# Patient Record
Sex: Female | Born: 1970 | Race: Black or African American | Hispanic: No | Marital: Single
Health system: Southern US, Community
[De-identification: ages and names within clinical notes are randomized; demographics above are authoritative.]

## PROBLEM LIST (undated history)

## (undated) ENCOUNTER — Emergency Department (HOSPITAL_COMMUNITY): Admission: EM | Payer: Medicare HMO

## (undated) DIAGNOSIS — K219 Gastro-esophageal reflux disease without esophagitis: Secondary | ICD-10-CM

## (undated) DIAGNOSIS — F32A Depression, unspecified: Secondary | ICD-10-CM

## (undated) DIAGNOSIS — M549 Dorsalgia, unspecified: Secondary | ICD-10-CM

## (undated) DIAGNOSIS — G629 Polyneuropathy, unspecified: Secondary | ICD-10-CM

## (undated) DIAGNOSIS — G8929 Other chronic pain: Secondary | ICD-10-CM

## (undated) DIAGNOSIS — M1712 Unilateral primary osteoarthritis, left knee: Secondary | ICD-10-CM

## (undated) DIAGNOSIS — I10 Essential (primary) hypertension: Secondary | ICD-10-CM

## (undated) DIAGNOSIS — R0600 Dyspnea, unspecified: Secondary | ICD-10-CM

## (undated) DIAGNOSIS — J449 Chronic obstructive pulmonary disease, unspecified: Secondary | ICD-10-CM

## (undated) DIAGNOSIS — F419 Anxiety disorder, unspecified: Secondary | ICD-10-CM

## (undated) DIAGNOSIS — F329 Major depressive disorder, single episode, unspecified: Secondary | ICD-10-CM

## (undated) DIAGNOSIS — G43909 Migraine, unspecified, not intractable, without status migrainosus: Secondary | ICD-10-CM

## (undated) DIAGNOSIS — M1990 Unspecified osteoarthritis, unspecified site: Secondary | ICD-10-CM

## (undated) DIAGNOSIS — J45909 Unspecified asthma, uncomplicated: Secondary | ICD-10-CM

## (undated) DIAGNOSIS — F319 Bipolar disorder, unspecified: Secondary | ICD-10-CM

## (undated) HISTORY — PX: BACK SURGERY: SHX140

## (undated) HISTORY — PX: TUBAL LIGATION: SHX77

---
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
## 2001-07-30 ENCOUNTER — Emergency Department (HOSPITAL_COMMUNITY): Admission: EM | Admit: 2001-07-30 | Discharge: 2001-07-30 | Payer: Self-pay | Admitting: Emergency Medicine

## 2002-03-28 ENCOUNTER — Encounter: Payer: Self-pay | Admitting: Emergency Medicine

## 2002-03-28 ENCOUNTER — Emergency Department (HOSPITAL_COMMUNITY): Admission: EM | Admit: 2002-03-28 | Discharge: 2002-03-28 | Payer: Self-pay | Admitting: Emergency Medicine

## 2002-11-08 ENCOUNTER — Encounter: Payer: Self-pay | Admitting: Emergency Medicine

## 2002-11-08 ENCOUNTER — Emergency Department (HOSPITAL_COMMUNITY): Admission: EM | Admit: 2002-11-08 | Discharge: 2002-11-08 | Payer: Self-pay | Admitting: Emergency Medicine

## 2003-02-01 ENCOUNTER — Emergency Department (HOSPITAL_COMMUNITY): Admission: EM | Admit: 2003-02-01 | Discharge: 2003-02-01 | Payer: Self-pay | Admitting: Emergency Medicine

## 2003-03-02 ENCOUNTER — Emergency Department (HOSPITAL_COMMUNITY): Admission: EM | Admit: 2003-03-02 | Discharge: 2003-03-02 | Payer: Self-pay | Admitting: Emergency Medicine

## 2003-05-18 ENCOUNTER — Emergency Department (HOSPITAL_COMMUNITY): Admission: EM | Admit: 2003-05-18 | Discharge: 2003-05-18 | Payer: Self-pay | Admitting: Emergency Medicine

## 2003-08-06 ENCOUNTER — Emergency Department (HOSPITAL_COMMUNITY): Admission: EM | Admit: 2003-08-06 | Discharge: 2003-08-06 | Payer: Self-pay | Admitting: Emergency Medicine

## 2003-11-09 ENCOUNTER — Emergency Department (HOSPITAL_COMMUNITY): Admission: EM | Admit: 2003-11-09 | Discharge: 2003-11-10 | Payer: Self-pay | Admitting: *Deleted

## 2004-01-04 ENCOUNTER — Emergency Department (HOSPITAL_COMMUNITY): Admission: EM | Admit: 2004-01-04 | Discharge: 2004-01-04 | Payer: Self-pay | Admitting: Emergency Medicine

## 2004-05-03 ENCOUNTER — Emergency Department (HOSPITAL_COMMUNITY): Admission: EM | Admit: 2004-05-03 | Discharge: 2004-05-03 | Payer: Self-pay | Admitting: Emergency Medicine

## 2006-03-05 ENCOUNTER — Emergency Department (HOSPITAL_COMMUNITY): Admission: EM | Admit: 2006-03-05 | Discharge: 2006-03-05 | Payer: Self-pay | Admitting: Emergency Medicine

## 2007-01-14 ENCOUNTER — Emergency Department (HOSPITAL_COMMUNITY): Admission: EM | Admit: 2007-01-14 | Discharge: 2007-01-14 | Payer: Self-pay | Admitting: Emergency Medicine

## 2007-04-13 ENCOUNTER — Emergency Department (HOSPITAL_COMMUNITY): Admission: EM | Admit: 2007-04-13 | Discharge: 2007-04-13 | Payer: Self-pay | Admitting: Emergency Medicine

## 2008-11-30 ENCOUNTER — Emergency Department (HOSPITAL_COMMUNITY): Admission: EM | Admit: 2008-11-30 | Discharge: 2008-11-30 | Payer: Self-pay | Admitting: Emergency Medicine

## 2010-05-31 ENCOUNTER — Emergency Department (HOSPITAL_COMMUNITY)
Admission: EM | Admit: 2010-05-31 | Discharge: 2010-05-31 | Disposition: A | Discharge status: Home / Self Care | Payer: Self-pay | Attending: Emergency Medicine | Admitting: Emergency Medicine

## 2010-05-31 ENCOUNTER — Emergency Department (HOSPITAL_COMMUNITY): Payer: Self-pay

## 2010-05-31 DIAGNOSIS — S91309A Unspecified open wound, unspecified foot, initial encounter: Secondary | ICD-10-CM | POA: Insufficient documentation

## 2010-05-31 DIAGNOSIS — Y92009 Unspecified place in unspecified non-institutional (private) residence as the place of occurrence of the external cause: Secondary | ICD-10-CM | POA: Insufficient documentation

## 2010-05-31 DIAGNOSIS — W268XXA Contact with other sharp object(s), not elsewhere classified, initial encounter: Secondary | ICD-10-CM | POA: Insufficient documentation

## 2010-06-01 ENCOUNTER — Emergency Department (HOSPITAL_COMMUNITY)
Admission: EM | Admit: 2010-06-01 | Discharge: 2010-06-01 | Disposition: A | Discharge status: Home / Self Care | Payer: Self-pay | Attending: Emergency Medicine | Admitting: Emergency Medicine

## 2010-06-01 DIAGNOSIS — W268XXA Contact with other sharp object(s), not elsewhere classified, initial encounter: Secondary | ICD-10-CM | POA: Insufficient documentation

## 2010-06-01 DIAGNOSIS — S91309A Unspecified open wound, unspecified foot, initial encounter: Secondary | ICD-10-CM | POA: Insufficient documentation

## 2010-06-01 DIAGNOSIS — Y92009 Unspecified place in unspecified non-institutional (private) residence as the place of occurrence of the external cause: Secondary | ICD-10-CM | POA: Insufficient documentation

## 2010-06-01 DIAGNOSIS — L03119 Cellulitis of unspecified part of limb: Secondary | ICD-10-CM | POA: Insufficient documentation

## 2010-06-01 DIAGNOSIS — L02619 Cutaneous abscess of unspecified foot: Secondary | ICD-10-CM | POA: Insufficient documentation

## 2010-06-01 LAB — DIFFERENTIAL
Basophils Absolute: 0 K/uL (ref 0.0–0.1)
Basophils Relative: 0 % (ref 0–1)
Eosinophils Absolute: 0.1 K/uL (ref 0.0–0.7)
Eosinophils Relative: 2 % (ref 0–5)
Lymphocytes Relative: 32 % (ref 12–46)
Lymphs Abs: 2 K/uL (ref 0.7–4.0)
Monocytes Absolute: 0.4 K/uL (ref 0.1–1.0)
Monocytes Relative: 6 % (ref 3–12)
Neutro Abs: 3.8 K/uL (ref 1.7–7.7)
Neutrophils Relative %: 61 % (ref 43–77)

## 2010-06-01 LAB — COMPREHENSIVE METABOLIC PANEL WITH GFR
ALT: 18 U/L (ref 0–35)
AST: 20 U/L (ref 0–37)
Albumin: 3.4 g/dL — ABNORMAL LOW (ref 3.5–5.2)
Alkaline Phosphatase: 49 U/L (ref 39–117)
BUN: 16 mg/dL (ref 6–23)
CO2: 24 meq/L (ref 19–32)
Calcium: 9.1 mg/dL (ref 8.4–10.5)
Chloride: 108 meq/L (ref 96–112)
Creatinine, Ser: 1.18 mg/dL (ref 0.4–1.2)
GFR calc non Af Amer: 50 mL/min — ABNORMAL LOW
Glucose, Bld: 90 mg/dL (ref 70–99)
Potassium: 4.1 meq/L (ref 3.5–5.1)
Sodium: 139 meq/L (ref 135–145)
Total Bilirubin: 0.3 mg/dL (ref 0.3–1.2)
Total Protein: 6.8 g/dL (ref 6.0–8.3)

## 2010-06-01 LAB — CBC
MCV: 94.2 fL (ref 78.0–100.0)
Platelets: 377 10*3/uL (ref 150–400)
RBC: 3.81 MIL/uL — ABNORMAL LOW (ref 3.87–5.11)
RDW: 13.3 % (ref 11.5–15.5)
WBC: 6.2 10*3/uL (ref 4.0–10.5)

## 2010-06-03 ENCOUNTER — Emergency Department (HOSPITAL_COMMUNITY)
Admission: EM | Admit: 2010-06-03 | Discharge: 2010-06-03 | Disposition: A | Discharge status: Home / Self Care | Payer: Self-pay | Attending: Emergency Medicine | Admitting: Emergency Medicine

## 2010-06-03 DIAGNOSIS — IMO0002 Reserved for concepts with insufficient information to code with codable children: Secondary | ICD-10-CM | POA: Insufficient documentation

## 2010-06-03 DIAGNOSIS — W268XXA Contact with other sharp object(s), not elsewhere classified, initial encounter: Secondary | ICD-10-CM | POA: Insufficient documentation

## 2010-06-03 DIAGNOSIS — M79609 Pain in unspecified limb: Secondary | ICD-10-CM | POA: Insufficient documentation

## 2010-07-21 ENCOUNTER — Emergency Department (HOSPITAL_COMMUNITY)
Admission: EM | Admit: 2010-07-21 | Discharge: 2010-07-21 | Disposition: A | Discharge status: Home / Self Care | Payer: Self-pay | Attending: Emergency Medicine | Admitting: Emergency Medicine

## 2010-07-21 ENCOUNTER — Emergency Department (HOSPITAL_COMMUNITY): Payer: Self-pay

## 2010-07-21 DIAGNOSIS — M545 Low back pain, unspecified: Secondary | ICD-10-CM | POA: Insufficient documentation

## 2010-07-21 DIAGNOSIS — M47817 Spondylosis without myelopathy or radiculopathy, lumbosacral region: Secondary | ICD-10-CM | POA: Insufficient documentation

## 2010-07-21 DIAGNOSIS — G8929 Other chronic pain: Secondary | ICD-10-CM | POA: Insufficient documentation

## 2010-07-21 DIAGNOSIS — Z9851 Tubal ligation status: Secondary | ICD-10-CM | POA: Insufficient documentation

## 2010-07-23 LAB — RAPID STREP SCREEN (MED CTR MEBANE ONLY): Streptococcus, Group A Screen (Direct): NEGATIVE

## 2010-07-23 LAB — URINALYSIS, ROUTINE W REFLEX MICROSCOPIC
Bilirubin Urine: NEGATIVE
Glucose, UA: NEGATIVE mg/dL
Hgb urine dipstick: NEGATIVE
Specific Gravity, Urine: 1.025 (ref 1.005–1.030)

## 2010-11-04 ENCOUNTER — Emergency Department (HOSPITAL_COMMUNITY)
Admission: EM | Admit: 2010-11-04 | Discharge: 2010-11-04 | Disposition: A | Discharge status: Home / Self Care | Payer: Medicaid Other | Attending: Emergency Medicine | Admitting: Emergency Medicine

## 2010-11-04 DIAGNOSIS — Z760 Encounter for issue of repeat prescription: Secondary | ICD-10-CM | POA: Insufficient documentation

## 2010-11-04 DIAGNOSIS — I10 Essential (primary) hypertension: Secondary | ICD-10-CM | POA: Insufficient documentation

## 2010-11-04 DIAGNOSIS — S339XXA Sprain of unspecified parts of lumbar spine and pelvis, initial encounter: Secondary | ICD-10-CM | POA: Insufficient documentation

## 2010-11-04 DIAGNOSIS — S39012A Strain of muscle, fascia and tendon of lower back, initial encounter: Secondary | ICD-10-CM

## 2010-11-04 HISTORY — DX: Essential (primary) hypertension: I10

## 2010-11-04 MED ORDER — HYDROCODONE-ACETAMINOPHEN 5-325 MG PO TABS
1.0000 | ORAL_TABLET | Freq: Once | ORAL | Status: AC
  Administered 2010-11-04: 1 via ORAL
  Filled 2010-11-04: qty 1

## 2010-11-04 MED ORDER — HYDROCHLOROTHIAZIDE 25 MG PO TABS: 25.0000 mg | ORAL_TABLET | Freq: Every day | ORAL | Status: DC

## 2010-11-04 MED ORDER — HYDROCODONE-ACETAMINOPHEN 5-325 MG PO TABS: 1.0000 | ORAL_TABLET | ORAL | Status: AC | PRN
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
## 2010-12-07 NOTE — ED Provider Notes (Signed)
Medical screening examination/treatment/procedure(s) were performed by non-physician practitioner and as supervising physician I was immediately available for consultation/collaboration.   Benny Lennert, MD 12/07/10 [phone]

## 2011-11-20 ENCOUNTER — Emergency Department (HOSPITAL_COMMUNITY)
Admission: EM | Admit: 2011-11-20 | Discharge: 2011-11-20 | Disposition: A | Discharge status: Home / Self Care | Payer: Medicaid Other | Attending: Emergency Medicine | Admitting: Emergency Medicine

## 2011-11-20 ENCOUNTER — Emergency Department (HOSPITAL_COMMUNITY): Payer: Medicaid Other

## 2011-11-20 ENCOUNTER — Encounter (HOSPITAL_COMMUNITY): Payer: Self-pay | Admitting: *Deleted

## 2011-11-20 DIAGNOSIS — I10 Essential (primary) hypertension: Secondary | ICD-10-CM | POA: Insufficient documentation

## 2011-11-20 DIAGNOSIS — W1789XA Other fall from one level to another, initial encounter: Secondary | ICD-10-CM | POA: Insufficient documentation

## 2011-11-20 DIAGNOSIS — R109 Unspecified abdominal pain: Secondary | ICD-10-CM | POA: Insufficient documentation

## 2011-11-20 DIAGNOSIS — S63509A Unspecified sprain of unspecified wrist, initial encounter: Secondary | ICD-10-CM | POA: Insufficient documentation

## 2011-11-20 DIAGNOSIS — Y92009 Unspecified place in unspecified non-institutional (private) residence as the place of occurrence of the external cause: Secondary | ICD-10-CM | POA: Insufficient documentation

## 2011-11-20 DIAGNOSIS — W19XXXA Unspecified fall, initial encounter: Secondary | ICD-10-CM

## 2011-11-20 DIAGNOSIS — F1010 Alcohol abuse, uncomplicated: Secondary | ICD-10-CM | POA: Insufficient documentation

## 2011-11-20 DIAGNOSIS — M25559 Pain in unspecified hip: Secondary | ICD-10-CM | POA: Insufficient documentation

## 2011-11-20 MED ORDER — TRAMADOL HCL 50 MG PO TABS
50.0000 mg | ORAL_TABLET | Freq: Once | ORAL | Status: AC
  Administered 2011-11-20: 50 mg via ORAL
  Filled 2011-11-20: qty 1

## 2011-11-20 MED ORDER — TRAMADOL HCL 50 MG PO TABS: 50.0000 mg | ORAL_TABLET | Freq: Four times a day (QID) | ORAL | Status: AC | PRN
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
## 2011-11-20 NOTE — ED Notes (Signed)
Patient transported to CT 
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
## 2013-05-31 ENCOUNTER — Emergency Department (HOSPITAL_COMMUNITY)
Admission: EM | Admit: 2013-05-31 | Discharge: 2013-05-31 | Disposition: A | Discharge status: Home / Self Care | Payer: Medicaid Other | Attending: Emergency Medicine | Admitting: Emergency Medicine

## 2013-05-31 ENCOUNTER — Emergency Department (HOSPITAL_COMMUNITY): Payer: Medicaid Other

## 2013-05-31 ENCOUNTER — Encounter (HOSPITAL_COMMUNITY): Payer: Self-pay | Admitting: Emergency Medicine

## 2013-05-31 DIAGNOSIS — Z79899 Other long term (current) drug therapy: Secondary | ICD-10-CM | POA: Insufficient documentation

## 2013-05-31 DIAGNOSIS — I10 Essential (primary) hypertension: Secondary | ICD-10-CM | POA: Insufficient documentation

## 2013-05-31 DIAGNOSIS — J111 Influenza due to unidentified influenza virus with other respiratory manifestations: Secondary | ICD-10-CM | POA: Insufficient documentation

## 2013-05-31 LAB — URINALYSIS, ROUTINE W REFLEX MICROSCOPIC
BILIRUBIN URINE: NEGATIVE
Glucose, UA: NEGATIVE mg/dL
HGB URINE DIPSTICK: NEGATIVE
Ketones, ur: NEGATIVE mg/dL
Leukocytes, UA: NEGATIVE
NITRITE: NEGATIVE
PROTEIN: 30 mg/dL — AB
SPECIFIC GRAVITY, URINE: 1.025 (ref 1.005–1.030)
UROBILINOGEN UA: 2 mg/dL — AB (ref 0.0–1.0)
pH: 6 (ref 5.0–8.0)

## 2013-05-31 LAB — CBC WITH DIFFERENTIAL/PLATELET
BASOS PCT: 0 % (ref 0–1)
Basophils Absolute: 0 10*3/uL (ref 0.0–0.1)
EOS ABS: 0 10*3/uL (ref 0.0–0.7)
EOS PCT: 0 % (ref 0–5)
HCT: 39.9 % (ref 36.0–46.0)
Hemoglobin: 13.9 g/dL (ref 12.0–15.0)
Lymphocytes Relative: 17 % (ref 12–46)
Lymphs Abs: 1.3 10*3/uL (ref 0.7–4.0)
MCH: 32.4 pg (ref 26.0–34.0)
MCHC: 34.8 g/dL (ref 30.0–36.0)
MCV: 93 fL (ref 78.0–100.0)
Monocytes Absolute: 0.7 10*3/uL (ref 0.1–1.0)
Monocytes Relative: 9 % (ref 3–12)
NEUTROS PCT: 74 % (ref 43–77)
Neutro Abs: 5.9 10*3/uL (ref 1.7–7.7)
PLATELETS: 355 10*3/uL (ref 150–400)
RBC: 4.29 MIL/uL (ref 3.87–5.11)
RDW: 12.8 % (ref 11.5–15.5)
WBC: 8 10*3/uL (ref 4.0–10.5)

## 2013-05-31 LAB — BASIC METABOLIC PANEL
BUN: 10 mg/dL (ref 6–23)
CALCIUM: 8.9 mg/dL (ref 8.4–10.5)
CO2: 23 mEq/L (ref 19–32)
Chloride: 98 mEq/L (ref 96–112)
Creatinine, Ser: 0.98 mg/dL (ref 0.50–1.10)
GFR, EST AFRICAN AMERICAN: 80 mL/min — AB (ref >90)
GFR, EST NON AFRICAN AMERICAN: 69 mL/min — AB (ref >90)
Glucose, Bld: 100 mg/dL — ABNORMAL HIGH (ref 70–99)
POTASSIUM: 4.1 meq/L (ref 3.7–5.3)
SODIUM: 134 meq/L — AB (ref 137–147)

## 2013-05-31 LAB — URINE MICROSCOPIC-ADD ON

## 2013-05-31 MED ORDER — KETOROLAC TROMETHAMINE 30 MG/ML IJ SOLN
30.0000 mg | Freq: Once | INTRAMUSCULAR | Status: AC
  Administered 2013-05-31: 30 mg via INTRAVENOUS
  Filled 2013-05-31: qty 1

## 2013-05-31 MED ORDER — TRAMADOL HCL 50 MG PO TABS: 50.0000 mg | ORAL_TABLET | Freq: Four times a day (QID) | ORAL | Status: DC | PRN

## 2013-05-31 MED ORDER — SODIUM CHLORIDE 0.9 % IV SOLN
1000.0000 mL | INTRAVENOUS | Status: DC
  Administered 2013-05-31: 1000 mL via INTRAVENOUS

## 2013-05-31 MED ORDER — SODIUM CHLORIDE 0.9 % IV SOLN
1000.0000 mL | Freq: Once | INTRAVENOUS | Status: AC
  Administered 2013-05-31: 1000 mL via INTRAVENOUS

## 2013-05-31 NOTE — ED Notes (Signed)
Pt states that she does feel better at discharge, is no longer dizzy with standing,

## 2013-05-31 NOTE — Discharge Instructions (Signed)
Influenza, Adult Influenza ("the flu") is a viral infection of the respiratory tract. It occurs more often in winter months because people spend more time in close contact with one another. Influenza can make you feel very sick. Influenza easily spreads from person to person (contagious). CAUSES  Influenza is caused by a virus that infects the respiratory tract. You can catch the virus by breathing in droplets from an infected person's cough or sneeze. You can also catch the virus by touching something that was recently contaminated with the virus and then touching your mouth, nose, or eyes. SYMPTOMS  Symptoms typically last 4 to 10 days and may include:  Fever.  Chills.  Headache, body aches, and muscle aches.  Sore throat.  Chest discomfort and cough.  Poor appetite.  Weakness or feeling tired.  Dizziness.  Nausea or vomiting. DIAGNOSIS  Diagnosis of influenza is often made based on your history and a physical exam. A nose or throat swab test can be done to confirm the diagnosis. RISKS AND COMPLICATIONS You may be at risk for a more severe case of influenza if you smoke cigarettes, have diabetes, have chronic heart disease (such as heart failure) or lung disease (such as asthma), or if you have a weakened immune system. Elderly people and pregnant women are also at risk for more serious infections. The most common complication of influenza is a lung infection (pneumonia). Sometimes, this complication can require emergency medical care and may be life-threatening. PREVENTION  An annual influenza vaccination (flu shot) is the best way to avoid getting influenza. An annual flu shot is now routinely recommended for all adults in the [place] TREATMENT  In mild cases, influenza goes away on its own. Treatment is directed at relieving symptoms. For more severe cases, your caregiver may prescribe antiviral medicines to shorten the sickness. Antibiotic medicines are not effective, because the  infection is caused by a virus, not by bacteria. HOME CARE INSTRUCTIONS  Only take over-the-counter or prescription medicines for pain, discomfort, or fever as directed by your caregiver.  Use a cool mist humidifier to make breathing easier.  Get plenty of rest until your temperature returns to normal. This usually takes 3 to 4 days.  Drink enough fluids to keep your urine clear or pale yellow.  Cover your mouth and nose when coughing or sneezing, and wash your hands well to avoid spreading the virus.  Stay home from work or school until your fever has been gone for at least 1 full day. SEEK MEDICAL CARE IF:   You have chest pain or a deep cough that worsens or produces more mucus.  You have nausea, vomiting, or diarrhea. SEEK IMMEDIATE MEDICAL CARE IF:   You have difficulty breathing, shortness of breath, or your skin or nails turn bluish.  You have severe neck pain or stiffness.  You have a severe headache, facial pain, or earache.  You have a worsening or recurring fever.  You have nausea or vomiting that cannot be controlled. MAKE SURE YOU:  Understand these instructions.  Will watch your condition.  Will get help right away if you are not doing well or get worse. Document Released: 03/31/2000 Document Revised: 10/03/2011 Document Reviewed: 07/03/2011 [facility] Patient Information 2014 [place], [place].  Tramadol tablets What is this medicine? TRAMADOL (TRA ma dole) is a pain reliever. It is used to treat moderate to severe pain in adults. This medicine may be used for other purposes; ask your health care provider or pharmacist if you have questions.  COMMON BRAND NAME(S): Ultram What should I tell my health care provider before I take this medicine? They need to know if you have any of these conditions: -brain tumor -depression -drug abuse or addiction -head injury -if you frequently drink alcohol containing drinks -kidney disease or trouble passing urine -liver  disease -lung disease, asthma, or breathing problems -seizures or epilepsy -suicidal thoughts, plans, or attempt; a previous suicide attempt by you or a family member -an unusual or allergic reaction to tramadol, codeine, other medicines, foods, dyes, or preservatives -pregnant or trying to get pregnant -breast-feeding How should I use this medicine? Take this medicine by mouth with a full glass of water. Follow the directions on the prescription label. If the medicine upsets your stomach, take it with food or milk. Do not take more medicine than you are told to take. Talk to your pediatrician regarding the use of this medicine in children. Special care may be needed. Overdosage: If you think you have taken too much of this medicine contact a poison control center or emergency room at once. NOTE: This medicine is only for you. Do not share this medicine with others. What if I miss a dose? If you miss a dose, take it as soon as you can. If it is almost time for your next dose, take only that dose. Do not take double or extra doses. What may interact with this medicine? Do not take this medicine with any of the following medications: -MAOIs like Carbex, Eldepryl, Marplan, Nardil, and Parnate This medicine may also interact with the following medications: -alcohol or medicines that contain alcohol -antihistamines -benzodiazepines -bupropion -carbamazepine or oxcarbazepine -clozapine -cyclobenzaprine -digoxin -furazolidone -linezolid -medicines for depression, anxiety, or psychotic disturbances -medicines for migraine headache like almotriptan, eletriptan, frovatriptan, naratriptan, rizatriptan, sumatriptan, zolmitriptan -medicines for pain like pentazocine, buprenorphine, butorphanol, meperidine, nalbuphine, and propoxyphene -medicines for sleep -muscle relaxants -naltrexone -phenobarbital -phenothiazines like perphenazine, thioridazine, chlorpromazine, mesoridazine, fluphenazine,  prochlorperazine, promazine, and trifluoperazine -procarbazine -warfarin This list may not describe all possible interactions. Give your health care provider a list of all the medicines, herbs, non-prescription drugs, or dietary supplements you use. Also tell them if you smoke, drink alcohol, or use illegal drugs. Some items may interact with your medicine. What should I watch for while using this medicine? Tell your doctor or health care professional if your pain does not go away, if it gets worse, or if you have new or a different type of pain. You may develop tolerance to the medicine. Tolerance means that you will need a higher dose of the medicine for pain relief. Tolerance is normal and is expected if you take this medicine for a long time. Do not suddenly stop taking your medicine because you may develop a severe reaction. Your body becomes used to the medicine. This does NOT mean you are addicted. Addiction is a behavior related to getting and using a drug for a non-medical reason. If you have pain, you have a medical reason to take pain medicine. Your doctor will tell you how much medicine to take. If your doctor wants you to stop the medicine, the dose will be slowly lowered over time to avoid any side effects. You may get drowsy or dizzy. Do not drive, use machinery, or do anything that needs mental alertness until you know how this medicine affects you. Do not stand or sit up quickly, especially if you are an older patient. This reduces the risk of dizzy or fainting spells. Alcohol can increase or  decrease the effects of this medicine. Avoid alcoholic drinks. You may have constipation. Try to have a bowel movement at least every 2 to 3 days. If you do not have a bowel movement for 3 days, call your doctor or health care professional. Your mouth may get dry. Chewing sugarless gum or sucking hard candy, and drinking plenty of water may help. Contact your doctor if the problem does not go away or  is severe. What side effects may I notice from receiving this medicine? Side effects that you should report to your doctor or health care professional as soon as possible: -allergic reactions like skin rash, itching or hives, swelling of the face, lips, or tongue -breathing difficulties, wheezing -confusion -itching -light headedness or fainting spells -redness, blistering, peeling or loosening of the skin, including inside the mouth -seizures Side effects that usually do not require medical attention (report to your doctor or health care professional if they continue or are bothersome): -constipation -dizziness -drowsiness -headache -nausea, vomiting This list may not describe all possible side effects. Call your doctor for medical advice about side effects. You may report side effects to FDA at 1-800-FDA-1088. Where should I keep my medicine? Keep out of the reach of children. Store at room temperature between 15 and 30 degrees C (59 and 86 degrees F). Keep container tightly closed. Throw away any unused medicine after the expiration date. NOTE: This sheet is a summary. It may not cover all possible information. If you have questions about this medicine, talk to your doctor, pharmacist, or health care provider.  2014, [employer]. (2009-12-15 11:55:44)

## 2013-05-31 NOTE — ED Notes (Signed)
Pt states that she is not feeling any better, continues to complain of pain in chest and legs, comfort measure provided, update given

## 2013-05-31 NOTE — ED Provider Notes (Signed)
CSN: [id number]     Arrival date & time 05/31/13  0531 History   First MD Initiated Contact with Patient 05/31/13 0546     Chief Complaint  Patient presents with  . Generalized Body Aches  . Cough  . Influenza     (Consider location/radiation/quality/duration/timing/severity/associated sxs/prior Treatment) Patient is a 43 y.o. female presenting with cough and flu symptoms. The history is provided by the patient.  Cough Influenza Presenting symptoms: cough   She has been sick for the past week with fever, chills, and generalized bodyaches. There's been some nasal congestion with postnasal drip. There's been a cough productive of sputum which she swallows. There's been nausea and had mild diarrhea. Fevers been as high as 107.6 and has come down with Tylenol and ibuprofen. She denies sore throat. She has had sick contacts and she did not receive the influenza vaccination this season. She is treated herself with Day-Quill with no relief. She rates her pain at 10/10.  Past Medical History  Diagnosis Date  . Hypertension    Past Surgical History  Procedure Laterality Date  . Tubal ligation     History reviewed. No pertinent family history. History  Substance Use Topics  . Smoking status: Never Smoker   . Smokeless tobacco: Not on file  . Alcohol Use: No   OB History   Grav Para Term Preterm Abortions TAB SAB Ect Mult Living                 Review of Systems  Respiratory: Positive for cough.   All other systems reviewed and are negative.      Allergies  Flexeril and Ibuprofen  Home Medications   Current Outpatient Rx  Name  Route  Sig  Dispense  Refill  . hydrochlorothiazide (HYDRODIURIL) 25 MG tablet   Oral   Take 25 mg by mouth daily.          BP 124/83  Pulse 101  Temp(Src) 99.3 F (37.4 C) (Oral)  Resp 20  Ht 4\' 11"  (1.499 m)  Wt 135 lb (61.236 kg)  BMI 27.25 kg/m2  SpO2 97%  LMP 05/09/2013 Physical Exam  Nursing note and vitals reviewed.  43 year  old female, resting comfortably and in no acute distress. Vital signs are significant for borderline tachycardia with heart rate 101. Oxygen saturation is 97%, which is normal. Head is normocephalic and atraumatic. PERRLA, EOMI. Oropharynx is clear. There is no sinus tenderness. Neck is nontender and supple without adenopathy or JVD. Back is nontender and there is no CVA tenderness. Lungs are clear without rales, wheezes, or rhonchi. Chest is nontender. Heart has regular rate and rhythm without murmur. Abdomen is soft, flat, nontender without masses or hepatosplenomegaly and peristalsis is normoactive. Extremities have no cyanosis or edema, full range of motion is present. Skin is warm and dry without rash. Neurologic: Mental status is normal, cranial nerves are intact, there are no motor or sensory deficits.  ED Course  Procedures (including critical care time) Labs Review Results for orders placed during the hospital encounter of 05/31/13  CBC WITH DIFFERENTIAL      Result Value Ref Range   WBC 8.0  4.0 - 10.5 K/uL   RBC 4.29  3.87 - 5.11 MIL/uL   Hemoglobin 13.9  12.0 - 15.0 g/dL   HCT 39.9  36.0 - 46.0 %   MCV 93.0  78.0 - 100.0 fL   MCH 32.4  26.0 - 34.0 pg   MCHC 34.8  30.0 -  36.0 g/dL   RDW 12.8  11.5 - 15.5 %   Platelets 355  150 - 400 K/uL   Neutrophils Relative % 74  43 - 77 %   Neutro Abs 5.9  1.7 - 7.7 K/uL   Lymphocytes Relative 17  12 - 46 %   Lymphs Abs 1.3  0.7 - 4.0 K/uL   Monocytes Relative 9  3 - 12 %   Monocytes Absolute 0.7  0.1 - 1.0 K/uL   Eosinophils Relative 0  0 - 5 %   Eosinophils Absolute 0.0  0.0 - 0.7 K/uL   Basophils Relative 0  0 - 1 %   Basophils Absolute 0.0  0.0 - 0.1 K/uL  BASIC METABOLIC PANEL      Result Value Ref Range   Sodium 134 (*) 137 - 147 mEq/L   Potassium 4.1  3.7 - 5.3 mEq/L   Chloride 98  96 - 112 mEq/L   CO2 23  19 - 32 mEq/L   Glucose, Bld 100 (*) 70 - 99 mg/dL   BUN 10  6 - 23 mg/dL   Creatinine, Ser 0.98  0.50 - 1.10  mg/dL   Calcium 8.9  8.4 - 10.5 mg/dL   GFR calc non Af Amer 69 (*) >90 mL/min   GFR calc Af Amer 80 (*) >90 mL/min  URINALYSIS, ROUTINE W REFLEX MICROSCOPIC      Result Value Ref Range   Color, Urine YELLOW  YELLOW   APPearance CLOUDY (*) CLEAR   Specific Gravity, Urine 1.025  1.005 - 1.030   pH 6.0  5.0 - 8.0   Glucose, UA NEGATIVE  NEGATIVE mg/dL   Hgb urine dipstick NEGATIVE  NEGATIVE   Bilirubin Urine NEGATIVE  NEGATIVE   Ketones, ur NEGATIVE  NEGATIVE mg/dL   Protein, ur 30 (*) NEGATIVE mg/dL   Urobilinogen, UA 2.0 (*) 0.0 - 1.0 mg/dL   Nitrite NEGATIVE  NEGATIVE   Leukocytes, UA NEGATIVE  NEGATIVE  URINE MICROSCOPIC-ADD ON      Result Value Ref Range   Squamous Epithelial / LPF FEW (*) RARE   WBC, UA 0-2  <3 WBC/hpf   RBC / HPF 0-2  <3 RBC/hpf   Bacteria, UA FEW (*) RARE   Urine-Other MUCOUS PRESENT     Imaging Review Dg Chest 2 View  05/31/2013   CLINICAL DATA:  Generalized body aches, cough, and chills.  EXAM: CHEST  2 VIEW  COMPARISON:  None.  FINDINGS: The cardiomediastinal silhouette is within normal limits. The lungs are well inflated and clear. There is no evidence of pleural effusion or pneumothorax. No acute osseous abnormality is identified.  IMPRESSION: No active cardiopulmonary disease.   Electronically Signed   By: Logan Bores   On: 05/31/2013 07:07   Images viewed by me.  MDM   Final diagnoses:  Influenza    Fever and bodyaches most likely representing influenza. She has been sick for a week, so she is not a candidate for antiviral treatment. Because of severity of symptoms and the length of time she's been sick, CBC will be checked as well as chest x-ray and she'll be given IV fluids and IV ketorolac. Of note, she had claimed to be allergic to ibuprofen stating that it caused her to break out in hives. However, she states that she to children's Motrin and did not have any kind of reaction to it.  Workup is unremarkable including normal chest x-ray and  normal WBC. She's not feeling any better  with ketorolac. She is discharged with a prescription for tramadol for pain and symptomatic treatment for other flu symptoms.  Delora Fuel, MD [id number]

## 2013-05-31 NOTE — ED Notes (Signed)
Dr Glick at bedside,  

## 2013-06-12 ENCOUNTER — Emergency Department (HOSPITAL_COMMUNITY)
Admission: EM | Admit: 2013-06-12 | Discharge: 2013-06-12 | Disposition: A | Discharge status: Home / Self Care | Payer: Medicaid Other | Attending: Emergency Medicine | Admitting: Emergency Medicine

## 2013-06-12 ENCOUNTER — Encounter (HOSPITAL_COMMUNITY): Payer: Self-pay | Admitting: Emergency Medicine

## 2013-06-12 ENCOUNTER — Emergency Department (HOSPITAL_COMMUNITY): Payer: Medicaid Other

## 2013-06-12 DIAGNOSIS — R1110 Vomiting, unspecified: Secondary | ICD-10-CM | POA: Insufficient documentation

## 2013-06-12 DIAGNOSIS — I10 Essential (primary) hypertension: Secondary | ICD-10-CM | POA: Insufficient documentation

## 2013-06-12 DIAGNOSIS — J40 Bronchitis, not specified as acute or chronic: Secondary | ICD-10-CM

## 2013-06-12 DIAGNOSIS — J209 Acute bronchitis, unspecified: Secondary | ICD-10-CM | POA: Insufficient documentation

## 2013-06-12 DIAGNOSIS — Z79899 Other long term (current) drug therapy: Secondary | ICD-10-CM | POA: Insufficient documentation

## 2013-06-12 MED ORDER — PREDNISONE 20 MG PO TABS: ORAL_TABLET | ORAL | Status: DC

## 2013-06-12 MED ORDER — HYDROCOD POLST-CHLORPHEN POLST 10-8 MG/5ML PO LQCR: 5.0000 mL | Freq: Two times a day (BID) | ORAL | Status: DC | PRN

## 2013-06-12 MED ORDER — HYDROCOD POLST-CHLORPHEN POLST 10-8 MG/5ML PO LQCR
5.0000 mL | Freq: Once | ORAL | Status: AC
  Administered 2013-06-12: 5 mL via ORAL
  Filled 2013-06-12: qty 5

## 2013-06-12 MED ORDER — AZITHROMYCIN 250 MG PO TABS: ORAL_TABLET | ORAL | Status: DC

## 2013-06-12 MED ORDER — ALBUTEROL SULFATE HFA 108 (90 BASE) MCG/ACT IN AERS
2.0000 | INHALATION_SPRAY | Freq: Once | RESPIRATORY_TRACT | Status: AC
  Administered 2013-06-12: 2 via RESPIRATORY_TRACT
  Filled 2013-06-12: qty 6.7

## 2013-06-12 MED ORDER — PREDNISONE 50 MG PO TABS
60.0000 mg | ORAL_TABLET | Freq: Once | ORAL | Status: AC
  Administered 2013-06-12: 60 mg via ORAL
  Filled 2013-06-12 (×2): qty 1

## 2013-06-12 NOTE — Discharge Instructions (Signed)
Chest xray showed no pneumonia.   Rx for antibiotic, prednisone, cough syrup.   Albuterol inhaler given here.  fluids

## 2013-06-12 NOTE — ED Notes (Signed)
Awaiting Inhaler teaching prior to discharge.

## 2013-06-12 NOTE — ED Notes (Signed)
Pt c/o productive cough with yellow sputum as well as emesis and bilateral side pain x2 weeks. Pt states she was seen here last week and dx with flu. Pt states cough has worsened since her visit.

## 2013-06-12 NOTE — ED Provider Notes (Signed)
CSN: [id number]     Arrival date & time 06/12/13  1529 History   First MD Initiated Contact with Patient 06/12/13 1739  This chart was scribed for Nat Christen, MD by Terressa Koyanagi, ED Scribe. This patient was seen in room APA07/[facility]07 and the patient's care was started at 6:06 PM.     Chief Complaint  Patient presents with  . Cough  . Emesis   The history is provided by the patient. No language interpreter was used.   HPI Comments: Kathryn [name] is a 43 y.o. female who presents to the Emergency Department complaining of persistent coughing over the past two weeks. Pt complains of associated wheezing over the past week. Pt also reports of minor blood in her sputum. Pt was seen in the ED 12 days ago and was diagnosed with influenza and prescribed tramadol. Pt states the tramadol did not provide any relief. Pt states that during her last ED visit she was not prescribed any antibiotics. Pt has been trying hot tea with honey at home without relief. Pt denies smoking.   Past Medical History  Diagnosis Date  . Hypertension    Past Surgical History  Procedure Laterality Date  . Tubal ligation     History reviewed. No pertinent family history. History  Substance Use Topics  . Smoking status: Never Smoker   . Smokeless tobacco: Not on file  . Alcohol Use: No   OB History   Grav Para Term Preterm Abortions TAB SAB Ect Mult Living                 Review of Systems  Respiratory: Positive for cough and wheezing.   All other systems reviewed and are negative.   Allergies  Flexeril  Home Medications   Current Outpatient Rx  Name  Route  Sig  Dispense  Refill  . hydrochlorothiazide (HYDRODIURIL) 25 MG tablet   Oral   Take 25 mg by mouth daily.         . traMADol (ULTRAM) 50 MG tablet   Oral   Take 1 tablet (50 mg total) by mouth every 6 (six) hours as needed.   15 tablet   0    BP 171/98  Pulse 90  Temp(Src) 98.4 F (36.9 C) (Oral)  Resp 20  SpO2 98%  LMP  05/09/2013 Physical Exam  Nursing note and vitals reviewed. Constitutional: She is oriented to person, place, and time. She appears well-developed and well-nourished.  HENT:  Head: Normocephalic and atraumatic.  Eyes: Conjunctivae and EOM are normal. Pupils are equal, round, and reactive to light.  Neck: Normal range of motion. Neck supple.  Cardiovascular: Normal rate, regular rhythm and normal heart sounds.   Pulmonary/Chest: Effort normal. She has wheezes (Slight expiratiory wheezes).  Coughing   Abdominal: Soft. Bowel sounds are normal.  Musculoskeletal: Normal range of motion.  Neurological: She is alert and oriented to person, place, and time.  Skin: Skin is warm and dry.  Psychiatric: She has a normal mood and affect. Her behavior is normal.    ED Course  Procedures (including critical care time)  DIAGNOSTIC STUDIES: Oxygen Saturation is 98% on RA, normal by my interpretation.    COORDINATION OF CARE:   6:10 PM-Discussed CXR findings and it indicated no pneumonia. Discussed treatment plan which includes tussionex, albuterol inhaler, prednisone, antibiotic with pt at bedside and pt agreed to plan.   Labs Review Labs Reviewed - No data to display Imaging Review Dg Chest 2 View  06/12/2013  CLINICAL DATA:  Cough  EXAM: CHEST  2 VIEW  COMPARISON:  05/31/2013  FINDINGS: The heart size and mediastinal contours are within normal limits. Both lungs are clear. The visualized skeletal structures are unremarkable.  IMPRESSION: No active cardiopulmonary disease.   Electronically Signed   By: Daryll Brod M.D.   On: 06/12/2013 15:53    EKG Interpretation   None       MDM   Final diagnoses:  None    Chest x-ray negative for pneumonia. Will prescribe prednisone, albuterol inhaler, Tussionex, Zithromax secondary to longevity of symptoms  I personally performed the services described in this documentation, which was scribed in my presence. The recorded information has been  reviewed and is accurate.    Nat Christen, MD 06/12/13 [id number]

## 2014-06-26 ENCOUNTER — Encounter (HOSPITAL_COMMUNITY): Payer: Self-pay | Admitting: *Deleted

## 2014-06-26 ENCOUNTER — Emergency Department (HOSPITAL_COMMUNITY): Payer: Medicaid Other

## 2014-06-26 ENCOUNTER — Emergency Department (HOSPITAL_COMMUNITY)
Admission: EM | Admit: 2014-06-26 | Discharge: 2014-06-26 | Disposition: A | Discharge status: Home / Self Care | Payer: Medicaid Other | Attending: Emergency Medicine | Admitting: Emergency Medicine

## 2014-06-26 DIAGNOSIS — Z7952 Long term (current) use of systemic steroids: Secondary | ICD-10-CM | POA: Diagnosis not present

## 2014-06-26 DIAGNOSIS — Z720 Tobacco use: Secondary | ICD-10-CM | POA: Insufficient documentation

## 2014-06-26 DIAGNOSIS — Z79899 Other long term (current) drug therapy: Secondary | ICD-10-CM | POA: Insufficient documentation

## 2014-06-26 DIAGNOSIS — I10 Essential (primary) hypertension: Secondary | ICD-10-CM | POA: Diagnosis not present

## 2014-06-26 DIAGNOSIS — R0789 Other chest pain: Secondary | ICD-10-CM | POA: Diagnosis not present

## 2014-06-26 DIAGNOSIS — J45901 Unspecified asthma with (acute) exacerbation: Secondary | ICD-10-CM | POA: Insufficient documentation

## 2014-06-26 DIAGNOSIS — Z792 Long term (current) use of antibiotics: Secondary | ICD-10-CM | POA: Diagnosis not present

## 2014-06-26 DIAGNOSIS — J45909 Unspecified asthma, uncomplicated: Secondary | ICD-10-CM | POA: Diagnosis present

## 2014-06-26 HISTORY — DX: Unspecified asthma, uncomplicated: J45.909

## 2014-06-26 MED ORDER — HYDROCODONE-ACETAMINOPHEN 5-325 MG PO TABS: 1.0000 | ORAL_TABLET | ORAL | Status: DC | PRN

## 2014-06-26 MED ORDER — DEXAMETHASONE 4 MG PO TABS: 4.0000 mg | ORAL_TABLET | Freq: Two times a day (BID) | ORAL | Status: DC

## 2014-06-26 MED ORDER — MORPHINE SULFATE 4 MG/ML IJ SOLN
4.0000 mg | Freq: Once | INTRAMUSCULAR | Status: AC
  Administered 2014-06-26: 4 mg via INTRAVENOUS
  Filled 2014-06-26: qty 1

## 2014-06-26 MED ORDER — ALBUTEROL (5 MG/ML) CONTINUOUS INHALATION SOLN
10.0000 mg/h | INHALATION_SOLUTION | RESPIRATORY_TRACT | Status: DC
  Administered 2014-06-26: 10 mg/h via RESPIRATORY_TRACT
  Filled 2014-06-26: qty 20

## 2014-06-26 MED ORDER — ONDANSETRON HCL 4 MG/2ML IJ SOLN
4.0000 mg | Freq: Once | INTRAMUSCULAR | Status: AC
  Administered 2014-06-26: 4 mg via INTRAMUSCULAR
  Filled 2014-06-26: qty 2

## 2014-06-26 MED ORDER — HYDROCODONE-ACETAMINOPHEN 5-325 MG PO TABS
2.0000 | ORAL_TABLET | Freq: Once | ORAL | Status: AC
  Administered 2014-06-26: 2 via ORAL
  Filled 2014-06-26: qty 2

## 2014-06-26 MED ORDER — FENTANYL CITRATE 0.05 MG/ML IJ SOLN
50.0000 ug | Freq: Once | INTRAMUSCULAR | Status: AC
  Administered 2014-06-26: 50 ug via INTRAVENOUS
  Filled 2014-06-26: qty 2

## 2014-06-26 MED ORDER — IPRATROPIUM BROMIDE 0.02 % IN SOLN: 0.5000 mg | Freq: Once | RESPIRATORY_TRACT | Status: DC

## 2014-06-26 MED ORDER — METHOCARBAMOL 500 MG PO TABS
500.0000 mg | ORAL_TABLET | Freq: Three times a day (TID) | ORAL | Status: DC
Start: 2014-06-26 — End: 2014-11-09

## 2014-06-26 MED ORDER — METHOCARBAMOL 500 MG PO TABS
1000.0000 mg | ORAL_TABLET | Freq: Once | ORAL | Status: AC
  Administered 2014-06-26: 1000 mg via ORAL
  Filled 2014-06-26: qty 2

## 2014-06-26 MED ORDER — ALBUTEROL SULFATE (2.5 MG/3ML) 0.083% IN NEBU
2.5000 mg | INHALATION_SOLUTION | Freq: Once | RESPIRATORY_TRACT | Status: AC
  Administered 2014-06-26: 2.5 mg via RESPIRATORY_TRACT
  Filled 2014-06-26: qty 3

## 2014-06-26 MED ORDER — METHYLPREDNISOLONE SODIUM SUCC 125 MG IJ SOLR
125.0000 mg | Freq: Once | INTRAMUSCULAR | Status: AC
  Administered 2014-06-26: 125 mg via INTRAVENOUS
  Filled 2014-06-26: qty 2

## 2014-06-26 MED ORDER — IPRATROPIUM-ALBUTEROL 0.5-2.5 (3) MG/3ML IN SOLN
3.0000 mL | Freq: Once | RESPIRATORY_TRACT | Status: AC
  Administered 2014-06-26: 3 mL via RESPIRATORY_TRACT
  Filled 2014-06-26: qty 3

## 2014-06-26 MED ORDER — ALBUTEROL SULFATE (2.5 MG/3ML) 0.083% IN NEBU: 5.0000 mg | INHALATION_SOLUTION | Freq: Once | RESPIRATORY_TRACT | Status: DC

## 2014-06-26 NOTE — ED Notes (Signed)
Pt ambulated around nurses station.  No respiratory distress. Pt talking in complete sentences.  sats when finished walking are 100 %.  States she is ready to go.

## 2014-06-26 NOTE — ED Notes (Signed)
Pt states asthma flare up began on Saturday. Pt states bilateral rib and chest discomfort. States productive cough. Yellow, green and black in color. NAD. Audible wheezing noted.

## 2014-06-26 NOTE — Discharge Instructions (Signed)
Your chest x-ray is negative for pneumonia or other acute problem. Please use your albuterol every 4 hours. Please add Decadron 2 times daily, please take with a meal. May use Robaxin and Norco for your chest wall pain. Please see Dr. Criss Rosales next week for recheck and evaluation of your condition. Asthma Asthma is a recurring condition in which the airways tighten and narrow. Asthma can make it difficult to breathe. It can cause coughing, wheezing, and shortness of breath. Asthma episodes, also called asthma attacks, range from minor to life-threatening. Asthma cannot be cured, but medicines and lifestyle changes can help control it. CAUSES Asthma is believed to be caused by inherited (genetic) and environmental factors, but its exact cause is unknown. Asthma may be triggered by allergens, lung infections, or irritants in the air. Asthma triggers are different for each person. Common triggers include:   Animal dander.  Dust mites.  Cockroaches.  Pollen from trees or grass.  Mold.  Smoke.  Air pollutants such as dust, household cleaners, hair sprays, aerosol sprays, paint fumes, strong chemicals, or strong odors.  Cold air, weather changes, and winds (which increase molds and pollens in the air).  Strong emotional expressions such as crying or laughing hard.  Stress.  Certain medicines (such as aspirin) or types of drugs (such as beta-blockers).  Sulfites in foods and drinks. Foods and drinks that may contain sulfites include dried fruit, potato chips, and sparkling grape juice.  Infections or inflammatory conditions such as the flu, a cold, or an inflammation of the nasal membranes (rhinitis).  Gastroesophageal reflux disease (GERD).  Exercise or strenuous activity. SYMPTOMS Symptoms may occur immediately after asthma is triggered or many hours later. Symptoms include:  Wheezing.  Excessive nighttime or early morning coughing.  Frequent or severe coughing with a common  cold.  Chest tightness.  Shortness of breath. DIAGNOSIS  The diagnosis of asthma is made by a review of your medical history and a physical exam. Tests may also be performed. These may include:  Lung function studies. These tests show how much air you breathe in and out.  Allergy tests.  Imaging tests such as X-rays. TREATMENT  Asthma cannot be cured, but it can usually be controlled. Treatment involves identifying and avoiding your asthma triggers. It also involves medicines. There are 2 classes of medicine used for asthma treatment:   Controller medicines. These prevent asthma symptoms from occurring. They are usually taken every day.  Reliever or rescue medicines. These quickly relieve asthma symptoms. They are used as needed and provide short-term relief. Your health care provider will help you create an asthma action plan. An asthma action plan is a written plan for managing and treating your asthma attacks. It includes a list of your asthma triggers and how they may be avoided. It also includes information on when medicines should be taken and when their dosage should be changed. An action plan may also involve the use of a device called a peak flow meter. A peak flow meter measures how well the lungs are working. It helps you monitor your condition. HOME CARE INSTRUCTIONS   Take medicines only as directed by your health care provider. Speak with your health care provider if you have questions about how or when to take the medicines.  Use a peak flow meter as directed by your health care provider. Record and keep track of readings.  Understand and use the action plan to help minimize or stop an asthma attack without needing to seek medical  care.  Control your home environment in the following ways to help prevent asthma attacks:  Do not smoke. Avoid being exposed to secondhand smoke.  Change your heating and air conditioning filter regularly.  Limit your use of fireplaces and  wood stoves.  Get rid of pests (such as roaches and mice) and their droppings.  Throw away plants if you see mold on them.  Clean your floors and dust regularly. Use unscented cleaning products.  Try to have someone else vacuum for you regularly. Stay out of rooms while they are being vacuumed and for a short while afterward. If you vacuum, use a dust mask from a hardware store, a double-layered or microfilter vacuum cleaner bag, or a vacuum cleaner with a HEPA filter.  Replace carpet with wood, tile, or vinyl flooring. Carpet can trap dander and dust.  Use allergy-proof pillows, mattress covers, and box spring covers.  Wash bed sheets and blankets every week in hot water and dry them in a dryer.  Use blankets that are made of polyester or cotton.  Clean bathrooms and kitchens with bleach. If possible, have someone repaint the walls in these rooms with mold-resistant paint. Keep out of the rooms that are being cleaned and painted.  Wash hands frequently. SEEK MEDICAL CARE IF:   You have wheezing, shortness of breath, or a cough even if taking medicine to prevent attacks.  The colored mucus you cough up (sputum) is thicker than usual.  Your sputum changes from clear or white to yellow, green, gray, or bloody.  You have any problems that may be related to the medicines you are taking (such as a rash, itching, swelling, or trouble breathing).  You are using a reliever medicine more than 2-3 times per week.  Your peak flow is still at 50-79% of your personal best after following your action plan for 1 hour.  You have a fever. SEEK IMMEDIATE MEDICAL CARE IF:   You seem to be getting worse and are unresponsive to treatment during an asthma attack.  You are short of breath even at rest.  You get short of breath when doing very little physical activity.  You have difficulty eating, drinking, or talking due to asthma symptoms.  You develop chest pain.  You develop a fast  heartbeat.  You have a bluish color to your lips or fingernails.  You are light-headed, dizzy, or faint.  Your peak flow is less than 50% of your personal best. MAKE SURE YOU:   Understand these instructions.  Will watch your condition.  Will get help right away if you are not doing well or get worse. Document Released: 04/03/2005 Document Revised: 08/18/2013 Document Reviewed: 10/31/2012 [facility] Patient Information 2015 [place], [place]. This information is not intended to replace advice given to you by your health care provider. Make sure you discuss any questions you have with your health care provider.

## 2014-06-26 NOTE — ED Provider Notes (Signed)
CSN: [id number]     Arrival date & time 06/26/14  4098 History   First MD Initiated Contact with Patient 06/26/14 0818     Chief Complaint  Patient presents with  . Asthma     (Consider location/radiation/quality/duration/timing/severity/associated sxs/prior Treatment) HPI Comments: Pt co right and left chest wall pain as well as productive cough with yellow green phlem.  Patient is a 44 y.o. female presenting with asthma. The history is provided by the patient.  Asthma This is a chronic problem. The current episode started in the past 7 days. The problem occurs intermittently. The problem has been gradually worsening. Associated symptoms include congestion and coughing. Pertinent negatives include no abdominal pain, arthralgias, chest pain, fever, neck pain or rash. Nothing aggravates the symptoms. Treatments tried: proAir. The treatment provided no relief.    Past Medical History  Diagnosis Date  . Hypertension   . Asthma    Past Surgical History  Procedure Laterality Date  . Tubal ligation     No family history on file. History  Substance Use Topics  . Smoking status: Current Some Day Smoker    Types: Cigarettes  . Smokeless tobacco: Not on file  . Alcohol Use: No   OB History    No data available     Review of Systems  Constitutional: Negative for fever and activity change.       All ROS Neg except as noted in HPI  HENT: Positive for congestion. Negative for nosebleeds.   Eyes: Negative for photophobia and discharge.  Respiratory: Positive for cough, chest tightness, shortness of breath and wheezing.   Cardiovascular: Negative for chest pain and palpitations.  Gastrointestinal: Negative for abdominal pain and blood in stool.  Genitourinary: Negative for dysuria, frequency and hematuria.  Musculoskeletal: Negative for back pain, arthralgias and neck pain.  Skin: Negative.  Negative for rash.  Neurological: Negative for dizziness, seizures and speech difficulty.   Psychiatric/Behavioral: Negative for hallucinations and confusion.      Allergies  Clonopin; Fish allergy; Flexeril; Ibuprofen; and Tramadol  Home Medications   Prior to Admission medications   Medication Sig Start Date End Date Taking? Authorizing Provider  azithromycin (ZITHROMAX Z-PAK) 250 MG tablet 2 po day one, then 1 daily x 4 days 06/12/13   Nat Christen, MD  chlorpheniramine-HYDROcodone [facility]) 10-8 MG/5ML [facility] Take 5 mLs by mouth every 12 (twelve) hours as needed for cough. 06/12/13   Nat Christen, MD  predniSONE (DELTASONE) 20 MG tablet 3 tabs po day one, then 2 po daily x 4 days 06/12/13   Nat Christen, MD   BP 134/94 mmHg  Pulse 96  Temp(Src) 98.8 F (37.1 C) (Oral)  Resp 20  Ht 4\' 11"  (1.499 m)  Wt 135 lb (61.236 kg)  BMI 27.25 kg/m2  SpO2 100%  LMP 06/12/2014 Physical Exam  Constitutional: She is oriented to person, place, and time. She appears well-developed and well-nourished.  Non-toxic appearance.  HENT:  Head: Normocephalic.  Right Ear: Tympanic membrane and external ear normal.  Left Ear: Tympanic membrane and external ear normal.  Nasal congestion present.  Eyes: EOM and lids are normal. Pupils are equal, round, and reactive to light.  Neck: Normal range of motion. Neck supple. Carotid bruit is not present.  Cardiovascular: Normal rate, regular rhythm, normal heart sounds, intact distal pulses and normal pulses.   Pulmonary/Chest: Accessory muscle usage present. Tachypnea noted. No apnea. No respiratory distress. She has wheezes. She has rhonchi. She exhibits tenderness. She exhibits no  deformity.  Abdominal: Soft. Bowel sounds are normal. There is no tenderness. There is no guarding.  Musculoskeletal: Normal range of motion. She exhibits no edema or tenderness.  Neg Homan's sign.  Lymphadenopathy:       Head (right side): No submandibular adenopathy present.       Head (left side): No submandibular adenopathy present.    She has no cervical  adenopathy.  Neurological: She is alert and oriented to person, place, and time. She has normal strength. No cranial nerve deficit or sensory deficit.  Skin: Skin is warm and dry.  Psychiatric: She has a normal mood and affect. Her speech is normal.  Nursing note and vitals reviewed.   ED Course  Procedures (including critical care time) Labs Review Labs Reviewed - No data to display  Imaging Review No results found.   EKG Interpretation None      MDM  Little improvement after neb tx.Continuous neb given.  Pt much improved after continuous neb and decadron  Pt ambulated around nursing station with no drop in pulse ox.  Rx for norco, robaxin for chest wall pain and decadron given to the patient.   Final diagnoses:  None    *I have reviewed nursing notes, vital signs, and all appropriate lab and imaging results for this patient.**    Lily Kocher, PA-C 06/27/14 [place], DO 06/28/14 [phone]

## 2014-08-03 ENCOUNTER — Emergency Department (HOSPITAL_COMMUNITY)
Admission: EM | Admit: 2014-08-03 | Discharge: 2014-08-03 | Discharge status: Against Medical Advice | Payer: Medicaid Other | Attending: Emergency Medicine | Admitting: Emergency Medicine

## 2014-08-03 ENCOUNTER — Encounter (HOSPITAL_COMMUNITY): Payer: Self-pay | Admitting: Emergency Medicine

## 2014-08-03 DIAGNOSIS — J45909 Unspecified asthma, uncomplicated: Secondary | ICD-10-CM | POA: Diagnosis not present

## 2014-08-03 DIAGNOSIS — R109 Unspecified abdominal pain: Secondary | ICD-10-CM | POA: Diagnosis present

## 2014-08-03 DIAGNOSIS — I10 Essential (primary) hypertension: Secondary | ICD-10-CM | POA: Insufficient documentation

## 2014-08-03 NOTE — ED Notes (Signed)
Pt reports L flank pain that started 3 days ago. Pt reports increased pain with mvmt, breathing or walking. Pt states her pain is eased when she lays down.

## 2014-08-03 NOTE — ED Notes (Signed)
Pt left without being seen after Triage.

## 2014-08-05 ENCOUNTER — Emergency Department (HOSPITAL_COMMUNITY): Payer: Medicaid Other

## 2014-08-05 ENCOUNTER — Encounter (HOSPITAL_COMMUNITY): Payer: Self-pay

## 2014-08-05 ENCOUNTER — Emergency Department (HOSPITAL_COMMUNITY)
Admission: EM | Admit: 2014-08-05 | Discharge: 2014-08-05 | Disposition: A | Discharge status: Home / Self Care | Payer: Medicaid Other | Attending: Emergency Medicine | Admitting: Emergency Medicine

## 2014-08-05 DIAGNOSIS — R1032 Left lower quadrant pain: Secondary | ICD-10-CM | POA: Insufficient documentation

## 2014-08-05 DIAGNOSIS — I10 Essential (primary) hypertension: Secondary | ICD-10-CM | POA: Insufficient documentation

## 2014-08-05 DIAGNOSIS — Z87891 Personal history of nicotine dependence: Secondary | ICD-10-CM | POA: Insufficient documentation

## 2014-08-05 DIAGNOSIS — Z79899 Other long term (current) drug therapy: Secondary | ICD-10-CM | POA: Diagnosis not present

## 2014-08-05 DIAGNOSIS — Z3202 Encounter for pregnancy test, result negative: Secondary | ICD-10-CM | POA: Diagnosis not present

## 2014-08-05 DIAGNOSIS — J45909 Unspecified asthma, uncomplicated: Secondary | ICD-10-CM | POA: Diagnosis not present

## 2014-08-05 DIAGNOSIS — K7689 Other specified diseases of liver: Secondary | ICD-10-CM | POA: Diagnosis not present

## 2014-08-05 DIAGNOSIS — R109 Unspecified abdominal pain: Secondary | ICD-10-CM

## 2014-08-05 LAB — URINALYSIS, ROUTINE W REFLEX MICROSCOPIC
BILIRUBIN URINE: NEGATIVE
Glucose, UA: NEGATIVE mg/dL
KETONES UR: NEGATIVE mg/dL
NITRITE: NEGATIVE
Protein, ur: NEGATIVE mg/dL
Specific Gravity, Urine: 1.025 (ref 1.005–1.030)
Urobilinogen, UA: 4 mg/dL — ABNORMAL HIGH (ref 0.0–1.0)
pH: 6.5 (ref 5.0–8.0)

## 2014-08-05 LAB — URINE MICROSCOPIC-ADD ON

## 2014-08-05 LAB — PREGNANCY, URINE: Preg Test, Ur: NEGATIVE

## 2014-08-05 MED ORDER — CYCLOBENZAPRINE HCL 10 MG PO TABS: 10.0000 mg | ORAL_TABLET | Freq: Two times a day (BID) | ORAL | Status: DC | PRN

## 2014-08-05 MED ORDER — HYDROMORPHONE HCL 1 MG/ML IJ SOLN
1.0000 mg | Freq: Once | INTRAMUSCULAR | Status: AC
  Administered 2014-08-05: 1 mg via INTRAMUSCULAR
  Filled 2014-08-05: qty 1

## 2014-08-05 MED ORDER — OXYCODONE-ACETAMINOPHEN 5-325 MG PO TABS: 1.0000 | ORAL_TABLET | ORAL | Status: DC | PRN

## 2014-08-05 NOTE — ED Notes (Signed)
Pt c/o pain in left flank and pain with urination since Saturday.

## 2014-08-05 NOTE — Discharge Instructions (Signed)
Your CT scan shows areas of liver cysts. It is recommended to have a repeat CT scan or MRI in 6 months. This can be coordinated by your primary care doctor. Medication for pain and muscle relaxation.

## 2014-08-05 NOTE — ED Provider Notes (Signed)
CSN: [id number]     Arrival date & time 08/05/14  1252 History   First MD Initiated Contact with Patient 08/05/14 1402     Chief Complaint  Patient presents with  . Flank Pain     (Consider location/radiation/quality/duration/timing/severity/associated sxs/prior Treatment) HPI.... Left flank pain with minimal radiation to left lower quadrant for several days with normal urination. No fever, chills, dysuria, hematuria. No previous history of kidney stones. She has been taking Tylenol with minimal relief. Severity is moderate. Patient is eating and drinking normally.  Past Medical History  Diagnosis Date  . Hypertension   . Asthma    Past Surgical History  Procedure Laterality Date  . Tubal ligation     No family history on file. History  Substance Use Topics  . Smoking status: Former Smoker    Types: Cigarettes  . Smokeless tobacco: Not on file  . Alcohol Use: No   OB History    Gravida Para Term Preterm AB TAB SAB Ectopic Multiple Living   5 3 2 1 2 1 1   3      Review of Systems  All other systems reviewed and are negative.     Allergies  Clonopin; Fish allergy; Flexeril; Ibuprofen; and Tramadol  Home Medications   Prior to Admission medications   Medication Sig Start Date End Date Taking? Authorizing Provider  acetaminophen (TYLENOL) 500 MG tablet Take 1,500 mg by mouth every 6 (six) hours as needed for moderate pain.   Yes Historical Provider, MD  albuterol (PROVENTIL HFA;VENTOLIN HFA) 108 (90 BASE) MCG/ACT inhaler Inhale 1 puff into the lungs every 4 (four) hours as needed for wheezing or shortness of breath.   Yes Historical Provider, MD  hydrochlorothiazide (MICROZIDE) 12.5 MG capsule Take 12.5 mg by mouth daily.   Yes Historical Provider, MD  azithromycin (ZITHROMAX Z-PAK) 250 MG tablet 2 po day one, then 1 daily x 4 days Patient not taking: Reported on 06/26/2014 06/12/13   Nat Christen, MD  chlorpheniramine-HYDROcodone [facility] PENNKINETIC ER) 10-8 MG/5ML  LQCR Take 5 mLs by mouth every 12 (twelve) hours as needed for cough. Patient not taking: Reported on 06/26/2014 06/12/13   Nat Christen, MD  cyclobenzaprine (FLEXERIL) 10 MG tablet Take 1 tablet (10 mg total) by mouth 2 (two) times daily as needed for muscle spasms. 08/05/14   Nat Christen, MD  dexamethasone (DECADRON) 4 MG tablet Take 1 tablet (4 mg total) by mouth 2 (two) times daily with a meal. Patient not taking: Reported on 08/05/2014 06/26/14   Lily Kocher, PA-C  HYDROcodone-acetaminophen (NORCO/VICODIN) 5-325 MG per tablet Take 1 tablet by mouth every 4 (four) hours as needed. Patient not taking: Reported on 08/05/2014 06/26/14   Lily Kocher, PA-C  methocarbamol (ROBAXIN) 500 MG tablet Take 1 tablet (500 mg total) by mouth 3 (three) times daily. Patient not taking: Reported on 08/05/2014 06/26/14   Lily Kocher, PA-C  oxyCODONE-acetaminophen (PERCOCET) 5-325 MG per tablet Take 1-2 tablets by mouth every 4 (four) hours as needed. 08/05/14   Nat Christen, MD   BP 146/95 mmHg  Pulse 74  Temp(Src) 97.6 F (36.4 C) (Oral)  Resp 18  Ht 4\' 11"  (1.499 m)  Wt 135 lb (61.236 kg)  BMI 27.25 kg/m2  SpO2 100%  LMP 08/03/2014 Physical Exam  Constitutional: She is oriented to person, place, and time. She appears well-developed and well-nourished.  HENT:  Head: Normocephalic and atraumatic.  Eyes: Conjunctivae and EOM are normal. Pupils are equal, round, and reactive to light.  Neck: Normal range of motion. Neck supple.  Cardiovascular: Normal rate and regular rhythm.   Pulmonary/Chest: Effort normal and breath sounds normal.  Abdominal: Soft. Bowel sounds are normal.  Genitourinary:  Minimal to moderate left flank tenderness  Musculoskeletal: Normal range of motion.  Neurological: She is alert and oriented to person, place, and time.  Skin: Skin is warm and dry.  Psychiatric: She has a normal mood and affect. Her behavior is normal.  Nursing note and vitals reviewed.   ED Course  Procedures  (including critical care time) Labs Review Labs Reviewed  URINALYSIS, ROUTINE W REFLEX MICROSCOPIC - Abnormal; Notable for the following:    Hgb urine dipstick LARGE (*)    Urobilinogen, UA 4.0 (*)    Leukocytes, UA TRACE (*)    All other components within normal limits  URINE MICROSCOPIC-ADD ON - Abnormal; Notable for the following:    Squamous Epithelial / LPF FEW (*)    Bacteria, UA FEW (*)    All other components within normal limits  PREGNANCY, URINE    Imaging Review Ct Renal Stone Study  08/05/2014   CLINICAL DATA:  44 year old female with left flank pain began nausea with urination for the past 5 days  EXAM: CT ABDOMEN AND PELVIS WITHOUT CONTRAST  TECHNIQUE: Multidetector CT imaging of the abdomen and pelvis was performed following the standard protocol without IV contrast.  COMPARISON:  None.  FINDINGS: Lower Chest: The lung bases are clear. Visualized cardiac structures are within normal limits for size. No pericardial effusion. Unremarkable visualized distal thoracic esophagus.  Abdomen: Unenhanced CT was performed per clinician order. Lack of IV contrast limits sensitivity and specificity, especially for evaluation of abdominal/pelvic solid viscera. Within these limitations, unremarkable CT appearance of the stomach, duodenum, spleen, adrenal glands and pancreas. Normal hepatic morphology and contour. Nonspecific round 1.6 cm low-attenuation lesion in hepatic segment 5/4B does not demonstrate low-attenuation consistent with a simple cyst. A similar hypodense but non cystic lesions present centrally in the dome adjacent to the IVC (1.4 cm on image 9 of series 2) and slightly more inferior measuring 1.1 cm on image 11 of series 2.  Gallbladder is unremarkable. No intra or extrahepatic biliary ductal dilatation. No evidence of obstruction or focal bowel wall thickening. Normal appendix in the right lower quadrant. The terminal ileum is unremarkable.  Pelvis: Unremarkable bladder, uterus and  adnexa. Trace free fluid is likely physiologic. No suspicious adenopathy. Air containing structure in the vagina most consistent with a tampon.  Bones/Soft Tissues: No acute fracture or aggressive appearing lytic or blastic osseous lesion.  Vascular: Limited evaluation in the absence of intravenous contrast. No aneurysm.  IMPRESSION: 1. No acute abnormality in the abdomen or pelvis to explain the patient's clinical symptoms. 2. There are 3 nonspecific low-attenuation lesions within the liver measuring up to 1.6 cm which are not consistent with simple cysts. Given the patient's demographics these are statistically likely benign lesions such as hemangiomas, however this cannot be verified on noncontrast CT scan. Recommend further evaluation with liver protocol MRI or CT scan with contrast in 6 months. This recommendation follows ACR consensus guidelines: Managing Incidental Findings on Abdominal CT: White Paper of the ACR Incidental Findings Committee. [facility] [phone]   Electronically Signed   By: Jacqulynn Cadet M.D.   On: 08/05/2014 16:22     EKG Interpretation None      MDM   Final diagnoses:  Left flank pain  Liver cyst   No acute abdomen. CT  scan shows no evidence of a kidney stone; however, there are 3 nonspecific low-attenuation lesions within the liver which are not consistent with simple cysts.  This information was given to the patient and her husband. She will follow-up with her primary care doctor for repeat CT or MRI in 6 months. They both acknowledges information. Discharge medications Percocet and Flexeril 10 mg    Nat Christen, MD 08/05/14 2121

## 2014-11-09 ENCOUNTER — Encounter (HOSPITAL_COMMUNITY): Payer: Self-pay | Admitting: *Deleted

## 2014-11-09 ENCOUNTER — Emergency Department (HOSPITAL_COMMUNITY): Payer: Medicaid Other

## 2014-11-09 ENCOUNTER — Emergency Department (HOSPITAL_COMMUNITY)
Admission: EM | Admit: 2014-11-09 | Discharge: 2014-11-09 | Disposition: A | Discharge status: Home / Self Care | Payer: Medicaid Other | Attending: Emergency Medicine | Admitting: Emergency Medicine

## 2014-11-09 DIAGNOSIS — Z79899 Other long term (current) drug therapy: Secondary | ICD-10-CM | POA: Insufficient documentation

## 2014-11-09 DIAGNOSIS — E119 Type 2 diabetes mellitus without complications: Secondary | ICD-10-CM | POA: Insufficient documentation

## 2014-11-09 DIAGNOSIS — M79662 Pain in left lower leg: Secondary | ICD-10-CM | POA: Diagnosis present

## 2014-11-09 DIAGNOSIS — J45909 Unspecified asthma, uncomplicated: Secondary | ICD-10-CM | POA: Insufficient documentation

## 2014-11-09 DIAGNOSIS — I10 Essential (primary) hypertension: Secondary | ICD-10-CM | POA: Insufficient documentation

## 2014-11-09 DIAGNOSIS — M79605 Pain in left leg: Secondary | ICD-10-CM

## 2014-11-09 MED ORDER — HYDROCODONE-ACETAMINOPHEN 5-325 MG PO TABS: 2.0000 | ORAL_TABLET | ORAL | Status: DC | PRN

## 2014-11-09 MED ORDER — KETOROLAC TROMETHAMINE 60 MG/2ML IM SOLN: 60.0000 mg | Freq: Once | INTRAMUSCULAR | Status: DC

## 2014-11-09 MED ORDER — METHOCARBAMOL 500 MG PO TABS: 500.0000 mg | ORAL_TABLET | Freq: Two times a day (BID) | ORAL | Status: DC | PRN

## 2014-11-09 MED ORDER — METHOCARBAMOL 500 MG PO TABS
500.0000 mg | ORAL_TABLET | Freq: Once | ORAL | Status: AC
  Administered 2014-11-09: 500 mg via ORAL
  Filled 2014-11-09: qty 1

## 2014-11-09 NOTE — ED Notes (Signed)
Pt seen and evaluated by EDP for initial assessment.

## 2014-11-09 NOTE — Discharge Instructions (Signed)
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
## 2015-04-26 ENCOUNTER — Encounter (HOSPITAL_COMMUNITY): Payer: Self-pay | Admitting: Emergency Medicine

## 2015-04-26 ENCOUNTER — Emergency Department (HOSPITAL_COMMUNITY)
Admission: EM | Admit: 2015-04-26 | Discharge: 2015-04-26 | Disposition: A | Discharge status: Home / Self Care | Payer: Medicaid Other | Attending: Emergency Medicine | Admitting: Emergency Medicine

## 2015-04-26 ENCOUNTER — Emergency Department (HOSPITAL_COMMUNITY): Payer: Medicaid Other

## 2015-04-26 DIAGNOSIS — Y9289 Other specified places as the place of occurrence of the external cause: Secondary | ICD-10-CM | POA: Insufficient documentation

## 2015-04-26 DIAGNOSIS — Z87891 Personal history of nicotine dependence: Secondary | ICD-10-CM | POA: Insufficient documentation

## 2015-04-26 DIAGNOSIS — I10 Essential (primary) hypertension: Secondary | ICD-10-CM | POA: Insufficient documentation

## 2015-04-26 DIAGNOSIS — Z79899 Other long term (current) drug therapy: Secondary | ICD-10-CM | POA: Diagnosis not present

## 2015-04-26 DIAGNOSIS — Y9389 Activity, other specified: Secondary | ICD-10-CM | POA: Insufficient documentation

## 2015-04-26 DIAGNOSIS — W000XXA Fall on same level due to ice and snow, initial encounter: Secondary | ICD-10-CM | POA: Diagnosis not present

## 2015-04-26 DIAGNOSIS — S63613A Unspecified sprain of left middle finger, initial encounter: Secondary | ICD-10-CM | POA: Insufficient documentation

## 2015-04-26 DIAGNOSIS — S63615A Unspecified sprain of left ring finger, initial encounter: Secondary | ICD-10-CM | POA: Diagnosis not present

## 2015-04-26 DIAGNOSIS — J45909 Unspecified asthma, uncomplicated: Secondary | ICD-10-CM | POA: Diagnosis not present

## 2015-04-26 DIAGNOSIS — S6992XA Unspecified injury of left wrist, hand and finger(s), initial encounter: Secondary | ICD-10-CM | POA: Diagnosis present

## 2015-04-26 DIAGNOSIS — Y998 Other external cause status: Secondary | ICD-10-CM | POA: Diagnosis not present

## 2015-04-26 DIAGNOSIS — S63619A Unspecified sprain of unspecified finger, initial encounter: Secondary | ICD-10-CM

## 2015-04-26 MED ORDER — ACETAMINOPHEN-CODEINE #3 300-30 MG PO TABS
2.0000 | ORAL_TABLET | Freq: Once | ORAL | Status: AC
Start: 2015-04-26 — End: 2015-04-26
  Administered 2015-04-26: 2 via ORAL
  Filled 2015-04-26: qty 2

## 2015-04-26 MED ORDER — ACETAMINOPHEN-CODEINE #3 300-30 MG PO TABS: 1.0000 | ORAL_TABLET | Freq: Four times a day (QID) | ORAL | Status: DC | PRN

## 2015-04-26 NOTE — ED Provider Notes (Signed)
CSN: [id number]     Arrival date & time 04/26/15  1222 History  By signing my name below, I, [facility], attest that this documentation has been prepared under the direction and in the presence of Lily Kocher, PA-C. Electronically Signed: Virgel Bouquet, ED Scribe. 04/26/2015. 1:15 PM.   Chief Complaint  Patient presents with  . Finger Injury   Patient is a 45 y.o. female presenting with hand injury. The history is provided by the patient. No language interpreter was used.  Hand Injury Location:  Finger Injury: yes   Mechanism of injury: fall   Fall:    Fall occurred:  Standing   Impact surface:  Ice   Entrapped after fall: no   Finger location:  L middle finger and L ring finger Pain details:    Severity:  Moderate   Onset quality:  Sudden   Timing:  Constant Chronicity:  New Associated symptoms: decreased range of motion    HPI Comments: Kathryn [name] is a 45 y.o. female who presents to the Emergency Department complaining of constant, moderate, unchanging left hand middle and ring finger pain that began after a fall that occurred 9 hours ago. Patient reports that she was helping her husband walk outside of her house when she slipped and fell on the ice on outstretched arms, causing her fingers to bend backwards followed immediately by pain. She endorses associated decreased ROM secondary to pain. She denies any other symptoms currently.  Past Medical History  Diagnosis Date  . Hypertension   . Asthma    Past Surgical History  Procedure Laterality Date  . Tubal ligation     History reviewed. No pertinent family history. Social History  Substance Use Topics  . Smoking status: Former Smoker    Types: Cigarettes  . Smokeless tobacco: None  . Alcohol Use: No   OB History    Gravida Para Term Preterm AB TAB SAB Ectopic Multiple Living   5 3 2 1 2 1 1   3      Review of Systems  Musculoskeletal: Positive for arthralgias (Left hand third and fourth  fingers).  All other systems reviewed and are negative.     Allergies  Clonopin; Fish allergy; Flexeril; Ibuprofen; and Tramadol  Home Medications   Prior to Admission medications   Medication Sig Start Date End Date Taking? Authorizing Provider  acetaminophen (TYLENOL) 500 MG tablet Take 1,000 mg by mouth every 6 (six) hours as needed for moderate pain.    Yes Historical Provider, MD  albuterol (PROVENTIL HFA;VENTOLIN HFA) 108 (90 BASE) MCG/ACT inhaler Inhale 1 puff into the lungs every 4 (four) hours as needed for wheezing or shortness of breath.   Yes Historical Provider, MD  hydrochlorothiazide (MICROZIDE) 12.5 MG capsule Take 12.5 mg by mouth daily.   Yes Historical Provider, MD  methocarbamol (ROBAXIN) 500 MG tablet Take 1 tablet (500 mg total) by mouth 2 (two) times daily as needed for muscle spasms. 11/09/14  Yes Noemi Chapel, MD  acetaminophen-codeine (TYLENOL #3) 300-30 MG tablet Take 1-2 tablets by mouth every 6 (six) hours as needed for moderate pain. 04/26/15   Lily Kocher, PA-C   BP 142/91 mmHg  Pulse 83  Temp(Src) 98.1 F (36.7 C) (Oral)  Resp 18  Ht 4\' 11"  (1.499 m)  Wt 135 lb (61.236 kg)  BMI 27.25 kg/m2  SpO2 100%  LMP 04/19/2015 (Exact Date) Physical Exam  Constitutional: She is oriented to person, place, and time. She appears well-developed and well-nourished. No  distress.  HENT:  Head: Normocephalic and atraumatic.  Eyes: Conjunctivae and EOM are normal.  Neck: Neck supple. No tracheal deviation present.  Cardiovascular: Normal rate, regular rhythm and normal heart sounds.   Pulmonary/Chest: Effort normal and breath sounds normal. No respiratory distress.  Musculoskeletal: Normal range of motion.       Left shoulder: She exhibits normal range of motion and no deformity.       Left elbow: She exhibits normal range of motion and no deformity.  Good ROM of left shoulder and elbow. No deformity palpated of the forearm. Radial pulse is 2+. Pain to the left hand  middle finger and ring finger.  Neurological: She is alert and oriented to person, place, and time.  Skin: Skin is warm and dry.  Psychiatric: She has a normal mood and affect. Her behavior is normal.  Nursing note and vitals reviewed.   ED Course  Procedures   DIAGNOSTIC STUDIES: Oxygen Saturation is 100% on RA, normal by my interpretation.    COORDINATION OF CARE: 12:55 PM Will order pain medication and left hand x-ray. Will return to discuss results of imaging. Discussed treatment plan with pt at bedside and pt agreed to plan.  1:08 PM Returned to discuss results of negative left hand x-ray. Will apply buddy splint and prescribe Tylenol with codeine. Advised pt to take medications, rest, and ice fingers.  Labs Review Labs Reviewed - No data to display  Imaging Review Dg Hand Complete Left  04/26/2015  CLINICAL DATA:  45 year old female who fell this morning. Pain radiating into the first and second metacarpals. Initial encounter. EXAM: LEFT HAND - COMPLETE 3+ VIEW COMPARISON:  Left hand series 11/20/2011. FINDINGS: Distal radius and ulna appear stable and intact. Carpal bone alignment stable and within normal limits. Metacarpals stable and intact. Phalanges stable and intact. Joint spaces and alignment within normal limits. IMPRESSION: No acute fracture or dislocation identified about the left hand. Electronically Signed   By: Genevie Ann M.D.   On: 04/26/2015 12:55   I have personally reviewed and evaluated these images and lab results as part of my medical decision-making.   EKG Interpretation None      MDM  Xray of the left hand is negative for fracture or dislocation.  Finger buddy tape splint applied. No neurovascular compromise noted. Rx for tylenol codeine given to the patient. Pt to follow up with orthopedics if not improving.   Final diagnoses:  Finger sprain, initial encounter    *I have reviewed nursing notes, vital signs, and all appropriate lab and imaging results  for this patient.**  **I personally performed the services described in this documentation, which was scribed in my presence. The recorded information has been reviewed and is accurate.Lily Kocher, PA-C 04/29/15 1142  Elnora Morrison, MD 05/01/15 [phone]
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
## 2015-04-26 NOTE — Discharge Instructions (Signed)
Your xray is negative. Please use ice today  Tomorrow. Splint the fingers for the next 5 to 7 day. Use tylenol for mild pain. Use tylenol codeine for more severe pain. See Dr Criss Rosales for additional evaluation or management. Finger Sprain A finger sprain is a tear in one of the strong, fibrous tissues that connect the bones (ligaments) in your finger. The severity of the sprain depends on how much of the ligament is torn. The tear can be either partial or complete. CAUSES  Often, sprains are a result of a fall or accident. If you extend your hands to catch an object or to protect yourself, the force of the impact causes the fibers of your ligament to stretch too much. This excess tension causes the fibers of your ligament to tear. SYMPTOMS  You may have some loss of motion in your finger. Other symptoms include:  Bruising.  Tenderness.  Swelling. DIAGNOSIS  In order to diagnose finger sprain, your caregiver will physically examine your finger or thumb to determine how torn the ligament is. Your caregiver may also suggest an X-ray exam of your finger to make sure no bones are broken. TREATMENT  If your ligament is only partially torn, treatment usually involves keeping the finger in a fixed position (immobilization) for a short period. To do this, your caregiver will apply a bandage, cast, or splint to keep your finger from moving until it heals. For a partially torn ligament, the healing process usually takes 2 to 3 weeks. If your ligament is completely torn, you may need surgery to reconnect the ligament to the bone. After surgery a cast or splint will be applied and will need to stay on your finger or thumb for 4 to 6 weeks while your ligament heals. HOME CARE INSTRUCTIONS  Keep your injured finger elevated, when possible, to decrease swelling.  To ease pain and swelling, apply ice to your joint twice a day, for 2 to 3 days:  Put ice in a plastic bag.  Place a towel between your skin and the  bag.  Leave the ice on for 15 minutes.  Only take over-the-counter or prescription medicine for pain as directed by your caregiver.  Do not wear rings on your injured finger.  Do not leave your finger unprotected until pain and stiffness go away (usually 3 to 4 weeks).  Do not allow your cast or splint to get wet. Cover your cast or splint with a plastic bag when you shower or bathe. Do not swim.  Your caregiver may suggest special exercises for you to do during your recovery to prevent or limit permanent stiffness. SEEK IMMEDIATE MEDICAL CARE IF:  Your cast or splint becomes damaged.  Your pain becomes worse rather than better. MAKE SURE YOU:  Understand these instructions.  Will watch your condition.  Will get help right away if you are not doing well or get worse.   This information is not intended to replace advice given to you by your health care provider. Make sure you discuss any questions you have with your health care provider.   Document Released: 05/11/2004 Document Revised: 04/24/2014 Document Reviewed: 12/05/2010 Elsevier Interactive Patient Education [employer].

## 2015-06-15 ENCOUNTER — Encounter (HOSPITAL_COMMUNITY): Payer: Self-pay | Admitting: Emergency Medicine

## 2015-06-15 DIAGNOSIS — Z79899 Other long term (current) drug therapy: Secondary | ICD-10-CM | POA: Insufficient documentation

## 2015-06-15 DIAGNOSIS — R197 Diarrhea, unspecified: Secondary | ICD-10-CM | POA: Insufficient documentation

## 2015-06-15 DIAGNOSIS — J45909 Unspecified asthma, uncomplicated: Secondary | ICD-10-CM | POA: Diagnosis not present

## 2015-06-15 DIAGNOSIS — R112 Nausea with vomiting, unspecified: Secondary | ICD-10-CM | POA: Diagnosis not present

## 2015-06-15 DIAGNOSIS — Z87891 Personal history of nicotine dependence: Secondary | ICD-10-CM | POA: Insufficient documentation

## 2015-06-15 DIAGNOSIS — M5412 Radiculopathy, cervical region: Secondary | ICD-10-CM | POA: Diagnosis not present

## 2015-06-15 DIAGNOSIS — I10 Essential (primary) hypertension: Secondary | ICD-10-CM | POA: Insufficient documentation

## 2015-06-15 DIAGNOSIS — M542 Cervicalgia: Secondary | ICD-10-CM | POA: Diagnosis present

## 2015-06-15 MED ORDER — OXYCODONE-ACETAMINOPHEN 5-325 MG PO TABS
1.0000 | ORAL_TABLET | Freq: Once | ORAL | Status: AC
  Administered 2015-06-15: 1 via ORAL

## 2015-06-15 MED ORDER — OXYCODONE-ACETAMINOPHEN 5-325 MG PO TABS
ORAL_TABLET | ORAL | Status: AC
  Filled 2015-06-15: qty 1

## 2015-06-15 NOTE — ED Notes (Signed)
Pt states she has had a headache for the last 2 months that is in the back of her head and getting worse. Pt also reports a cough for 1 weeks, states she is just now getting over the flu and today coughed up some bright red blood.

## 2015-06-16 ENCOUNTER — Emergency Department (HOSPITAL_COMMUNITY)
Admission: EM | Admit: 2015-06-16 | Discharge: 2015-06-16 | Disposition: A | Discharge status: Home / Self Care | Payer: Medicaid Other | Attending: Emergency Medicine | Admitting: Emergency Medicine

## 2015-06-16 DIAGNOSIS — M5412 Radiculopathy, cervical region: Secondary | ICD-10-CM

## 2015-06-16 MED ORDER — OXYCODONE-ACETAMINOPHEN 5-325 MG PO TABS: 1.0000 | ORAL_TABLET | Freq: Three times a day (TID) | ORAL | Status: DC | PRN

## 2015-06-16 MED ORDER — OXYCODONE-ACETAMINOPHEN 5-325 MG PO TABS
1.0000 | ORAL_TABLET | Freq: Once | ORAL | Status: AC
  Administered 2015-06-16: 1 via ORAL
  Filled 2015-06-16: qty 1

## 2015-06-16 NOTE — ED Provider Notes (Signed)
CSN: [id number]     Arrival date & time 06/15/15  2202 History   By signing my name below, I, Nicole Kindred, attest that this documentation has been prepared under the direction and in the presence of No att. providers found.   Electronically Signed: Nicole Kindred, ED Scribe. 06/16/2015. 2:36 AM    Chief Complaint  Patient presents with  . Cough  . Headache    Patient is a 45 y.o. female presenting with neck injury. The history is provided by the patient. No language interpreter was used.  Neck Injury This is a chronic problem. The current episode started more than 1 week ago. The problem occurs constantly. The problem has not changed since onset.Pertinent negatives include no chest pain and no abdominal pain. She has tried nothing for the symptoms.   HPI Comments: Kathryn [name] is a 45 y.o. female with PMHx of HTN who presents to the Emergency Department complaining of gradual onset, constant neck pain, ongoing for approximately two months. Pt states that the pain starts in the back of her neck and radiates into her  Right arm. She also notes associated numbness in her right arm. Pt was sick with the flu recently and reports cough, vomiting, nausea, diarrhea, sore throat, and subjective fever. She has used tylenol, muscle rub, and a heat pad with no relief to symptoms. No other worsening or alleviating factors noted. Pt denies abdominal pain, chest pain, or any other pertinent symptoms.   Past Medical History  Diagnosis Date  . Hypertension   . Asthma    Past Surgical History  Procedure Laterality Date  . Tubal ligation     No family history on file. Social History  Substance Use Topics  . Smoking status: Former Smoker    Types: Cigarettes  . Smokeless tobacco: None  . Alcohol Use: No   OB History    Gravida Para Term Preterm AB TAB SAB Ectopic Multiple Living   5 3 2 1 2 1 1   3      Review of Systems  Constitutional: Positive for fever.  HENT: Positive for sore  throat.   Respiratory: Positive for cough.   Cardiovascular: Negative for chest pain.  Gastrointestinal: Positive for nausea, vomiting and diarrhea. Negative for abdominal pain.  Musculoskeletal: Positive for neck pain.       Pain radiates from back of neck into her arms.   Neurological: Positive for numbness.       Numbness to her right arm and right shoulder.   All other systems reviewed and are negative.   Allergies  Clonopin; Fish allergy; Flexeril; Ibuprofen; and Tramadol  Home Medications   Prior to Admission medications   Medication Sig Start Date End Date Taking? Authorizing Provider  acetaminophen (TYLENOL) 500 MG tablet Take 1,000 mg by mouth every 6 (six) hours as needed for moderate pain.     Historical Provider, MD  acetaminophen-codeine (TYLENOL #3) 300-30 MG tablet Take 1-2 tablets by mouth every 6 (six) hours as needed for moderate pain. 04/26/15   Lily Kocher, PA-C  albuterol (PROVENTIL HFA;VENTOLIN HFA) 108 (90 BASE) MCG/ACT inhaler Inhale 1 puff into the lungs every 4 (four) hours as needed for wheezing or shortness of breath.    Historical Provider, MD  hydrochlorothiazide (MICROZIDE) 12.5 MG capsule Take 12.5 mg by mouth daily.    Historical Provider, MD  methocarbamol (ROBAXIN) 500 MG tablet Take 1 tablet (500 mg total) by mouth 2 (two) times daily as needed for muscle spasms. 11/09/14  Noemi Chapel, MD   BP 129/85 mmHg  Pulse 77  Temp(Src) 97.7 F (36.5 C) (Oral)  Resp 20  SpO2 97%  LMP 06/13/2015 Physical Exam CONSTITUTIONAL: Well developed/well nourished HEAD: Normocephalic/atraumatic EYES: EOMI/PERRL ENMT: Mucous membranes moist NECK: supple no meningeal signs. No bruits. Right cervical paraspinal TTP.  SPINE/BACK:entire spine nontender CV: S1/S2 noted, no murmurs/rubs/gallops noted LUNGS: Lungs are clear to auscultation bilaterally, no apparent distress ABDOMEN: soft, nontender, no rebound or guarding, bowel sounds noted throughout abdomen NEURO: Pt  is awake/alert/appropriate, moves all extremitiesx4.  No facial droop.  No ataxia.  Equal power (5/5) with hand grip, wrist flex/extension, elbow flex/extension, and equal power with shoulder abduction/adduction.  No focal sensory deficit to light touch is noted in either UE.   Equal (2+) biceps/brachioradialis/tricep reflex in bilateral UE EXTREMITIES: pulses normal/equal, full ROM SKIN: warm, color normal PSYCH: no abnormalities of mood noted, alert and oriented to situation  ED Course  Procedures  DIAGNOSTIC STUDIES: Oxygen Saturation is 97% on RA, normal by my interpretation.    COORDINATION OF CARE: 2:34 AM-Discussed treatment plan which includes anti-inflammatory and percocet for pain relief with pt at bedside and pt agreed to plan.   Strong suspicion this is cervical radiculopathy No focal weakness identified Discussed need for outpatient f/u Appropriate for d/c home  MDM   Final diagnoses:  Cervical radiculopathy   Nursing notes including past medical history and social history reviewed and considered in documentation   I personally performed the services described in this documentation, which was scribed in my presence. The recorded information has been reviewed and is accurate.      Ripley Fraise, MD 06/16/15 [phone]

## 2015-06-16 NOTE — ED Notes (Signed)
Pt c/o sharp pain in posterior head x 2 months; has been taking ibuprofen without relief.

## 2015-06-16 NOTE — Discharge Instructions (Signed)

## 2015-06-26 ENCOUNTER — Emergency Department (HOSPITAL_COMMUNITY)
Admission: EM | Admit: 2015-06-26 | Discharge: 2015-06-26 | Disposition: A | Discharge status: Home / Self Care | Payer: Medicaid Other | Attending: Emergency Medicine | Admitting: Emergency Medicine

## 2015-06-26 ENCOUNTER — Encounter (HOSPITAL_COMMUNITY): Payer: Self-pay | Admitting: Emergency Medicine

## 2015-06-26 ENCOUNTER — Emergency Department (HOSPITAL_COMMUNITY): Payer: Medicaid Other

## 2015-06-26 DIAGNOSIS — R200 Anesthesia of skin: Secondary | ICD-10-CM | POA: Insufficient documentation

## 2015-06-26 DIAGNOSIS — J45909 Unspecified asthma, uncomplicated: Secondary | ICD-10-CM | POA: Diagnosis not present

## 2015-06-26 DIAGNOSIS — I10 Essential (primary) hypertension: Secondary | ICD-10-CM | POA: Diagnosis not present

## 2015-06-26 DIAGNOSIS — J029 Acute pharyngitis, unspecified: Secondary | ICD-10-CM | POA: Diagnosis not present

## 2015-06-26 DIAGNOSIS — Z87891 Personal history of nicotine dependence: Secondary | ICD-10-CM | POA: Insufficient documentation

## 2015-06-26 DIAGNOSIS — M542 Cervicalgia: Secondary | ICD-10-CM | POA: Diagnosis present

## 2015-06-26 DIAGNOSIS — M5412 Radiculopathy, cervical region: Secondary | ICD-10-CM | POA: Insufficient documentation

## 2015-06-26 MED ORDER — METHOCARBAMOL 500 MG PO TABS: 1000.0000 mg | ORAL_TABLET | Freq: Four times a day (QID) | ORAL | Status: AC

## 2015-06-26 MED ORDER — MAGIC MOUTHWASH W/LIDOCAINE: ORAL | Status: DC

## 2015-06-26 MED ORDER — METHOCARBAMOL 500 MG PO TABS
1000.0000 mg | ORAL_TABLET | Freq: Once | ORAL | Status: AC
  Administered 2015-06-26: 1000 mg via ORAL
  Filled 2015-06-26: qty 2

## 2015-06-26 MED ORDER — PREDNISONE 10 MG PO TABS: ORAL_TABLET | ORAL | Status: DC

## 2015-06-26 NOTE — ED Notes (Signed)
Julie Idol, PA at bedside 

## 2015-06-26 NOTE — ED Notes (Signed)
PA Evalee Jefferson at bedside updating patient.

## 2015-06-26 NOTE — Discharge Instructions (Signed)
Cervical Radiculopathy Cervical radiculopathy happens when a nerve in the neck (cervical nerve) is pinched or bruised. This condition can develop because of an injury or as part of the normal aging process. Pressure on the cervical nerves can cause pain or numbness that runs from the neck all the way down into the arm and fingers. Usually, this condition gets better with rest. Treatment may be needed if the condition does not improve.  CAUSES This condition may be caused by:  Injury.  Slipped (herniated) disk.  Muscle tightness in the neck because of overuse.  Arthritis.  Breakdown or degeneration in the bones and joints of the spine (spondylosis) due to aging.  Bone spurs that may develop near the cervical nerves. SYMPTOMS Symptoms of this condition include:  Pain that runs from the neck to the arm and hand. The pain can be severe or irritating. It may be worse when the neck is moved.  Numbness or weakness in the affected arm and hand. DIAGNOSIS This condition may be diagnosed based on symptoms, medical history, and a physical exam. You may also have tests, including:  X-rays.  CT scan.  MRI.  Electromyogram (EMG).  Nerve conduction tests. TREATMENT In many cases, treatment is not needed for this condition. With rest, the condition usually gets better over time. If treatment is needed, options may include:  Wearing a soft neck collar for short periods of time.  Physical therapy to strengthen your neck muscles.  Medicines, such as NSAIDs, oral corticosteroids, or spinal injections.  Surgery. This may be needed if other treatments do not help. Various types of surgery may be done depending on the cause of your problems. HOME CARE INSTRUCTIONS Managing Pain  Take over-the-counter and prescription medicines only as told by your health care provider.  If directed, apply ice to the affected area.  Put ice in a plastic bag.  Place a towel between your skin and the  bag.  Leave the ice on for 20 minutes, 2-3 times per day.  If ice does not help, you can try using heat. Take a warm shower or warm bath, or use a heat pack as told by your health care provider.  Try a gentle neck and shoulder massage to help relieve symptoms. Activity  Rest as needed. Follow instructions from your health care provider about any restrictions on activities.  Do stretching and strengthening exercises as told by your health care provider or physical therapist. General Instructions  If you were given a soft collar, wear it as told by your health care provider.  Use a flat pillow when you sleep.  Keep all follow-up visits as told by your health care provider. This is important. SEEK MEDICAL CARE IF:  Your condition does not improve with treatment. SEEK IMMEDIATE MEDICAL CARE IF:  Your pain gets much worse and cannot be controlled with medicines.  You have weakness or numbness in your hand, arm, face, or leg.  You have a high fever.  You have a stiff, rigid neck.  You lose control of your bowels or your bladder (have incontinence).  You have trouble with walking, balance, or speaking.   This information is not intended to replace advice given to you by your health care provider. Make sure you discuss any questions you have with your health care provider.   Document Released: 12/27/2000 Document Revised: 12/23/2014 Document Reviewed: 05/28/2014 Elsevier Interactive Patient Education 2016 [employer]  Pharyngitis Pharyngitis is redness, pain, and swelling (inflammation) of your pharynx.  CAUSES  Pharyngitis is usually caused by infection. Most of the time, these infections are from viruses (viral) and are part of a cold. However, sometimes pharyngitis is caused by bacteria (bacterial). Pharyngitis can also be caused by allergies. Viral pharyngitis may be spread from person to person by coughing, sneezing, and personal items or utensils (cups, forks, spoons,  toothbrushes). Bacterial pharyngitis may be spread from person to person by more intimate contact, such as kissing.  SIGNS AND SYMPTOMS  Symptoms of pharyngitis include:   Sore throat.   Tiredness (fatigue).   Low-grade fever.   Headache.  Joint pain and muscle aches.  Skin rashes.  Swollen lymph nodes.  Plaque-like film on throat or tonsils (often seen with bacterial pharyngitis). DIAGNOSIS  Your health care provider will ask you questions about your illness and your symptoms. Your medical history, along with a physical exam, is often all that is needed to diagnose pharyngitis. Sometimes, a rapid strep test is done. Other lab tests may also be done, depending on the suspected cause.  TREATMENT  Viral pharyngitis will usually get better in 3-4 days without the use of medicine. Bacterial pharyngitis is treated with medicines that kill germs (antibiotics).  HOME CARE INSTRUCTIONS   Drink enough water and fluids to keep your urine clear or pale yellow.   Only take over-the-counter or prescription medicines as directed by your health care provider:   If you are prescribed antibiotics, make sure you finish them even if you start to feel better.   Do not take aspirin.   Get lots of rest.   Gargle with 8 oz of salt water ( tsp of salt per 1 qt of water) as often as every 1-2 hours to soothe your throat.   Throat lozenges (if you are not at risk for choking) or sprays may be used to soothe your throat. SEEK MEDICAL CARE IF:   You have large, tender lumps in your neck.  You have a rash.  You cough up green, yellow-brown, or bloody spit. SEEK IMMEDIATE MEDICAL CARE IF:   Your neck becomes stiff.  You drool or are unable to swallow liquids.  You vomit or are unable to keep medicines or liquids down.  You have severe pain that does not go away with the use of recommended medicines.  You have trouble breathing (not caused by a stuffy nose). MAKE SURE YOU:    Understand these instructions.  Will watch your condition.  Will get help right away if you are not doing well or get worse.   This information is not intended to replace advice given to you by your health care provider. Make sure you discuss any questions you have with your health care provider.   Document Released: 04/03/2005 Document Revised: 01/22/2013 Document Reviewed: 12/09/2012 Elsevier Interactive Patient Education [employer].

## 2015-06-26 NOTE — ED Provider Notes (Signed)
CSN: [id number]     Arrival date & time 06/26/15  0803 History   First MD Initiated Contact with Patient 06/26/15 [phone]     Chief Complaint  Patient presents with  . Sore Throat  . Shoulder Pain  . Neck Pain     (Consider location/radiation/quality/duration/timing/severity/associated sxs/prior Treatment) The history is provided by the patient.   Kathryn [name] is a 45 y.o. female returning for further evaluation of the right shoulder and neck pain which has been present for the past 3 months.  She describes  Sharp constant pain, worse with positional changes, particularly with neck rotation along with intermittent stabs of pain, sometimes burning in quality radiating to her right elbow.  She denies any specific injuries to her shoulder and neck.  She was seen for this 2 weeks ago here and her exam was suspicious for cervical radiculopathy.  She was prescribed oxycodone which was helpful.  She has since used tylenol, muscle rubs and heat, with only heat being somewhat helpful.  She endorses occasional numbness into the hand, not currently.  She denies headache, fever, chest pain or sob.  She has been unable to followup with her pcp for this problem yet.  Additionally describes sore throat starting over the past week, worsened by swallowing.  Again, denies fevers.  No chills, ear pain, nasal congestion or rhinorrhea.  She has had a non productive cough.  She has tried chloraseptic spray which made her throat burn.  She denies any known sick contacts.    Past Medical History  Diagnosis Date  . Hypertension   . Asthma    Past Surgical History  Procedure Laterality Date  . Tubal ligation     History reviewed. No pertinent family history. Social History  Substance Use Topics  . Smoking status: Former Smoker    Types: Cigarettes  . Smokeless tobacco: None  . Alcohol Use: No   OB History    Gravida Para Term Preterm AB TAB SAB Ectopic Multiple Living   5 3 2 1 2 1 1   3      Review  of Systems  Constitutional: Negative for fever and chills.  HENT: Positive for sore throat. Negative for congestion, ear pain, rhinorrhea, sinus pressure, trouble swallowing and voice change.   Eyes: Negative for discharge.  Respiratory: Positive for cough. Negative for shortness of breath, wheezing and stridor.   Cardiovascular: Negative for chest pain.  Gastrointestinal: Negative for abdominal pain.  Genitourinary: Negative.   Musculoskeletal: Positive for myalgias, arthralgias and neck pain. Negative for joint swelling.  Neurological: Positive for numbness. Negative for weakness.      Allergies  Clonopin; Fish allergy; Flexeril; Ibuprofen; and Tramadol  Home Medications   Prior to Admission medications   Medication Sig Start Date End Date Taking? Authorizing Provider  acetaminophen (TYLENOL) 500 MG tablet Take 1,000 mg by mouth every 6 (six) hours as needed for moderate pain.     Historical Provider, MD  albuterol (PROVENTIL HFA;VENTOLIN HFA) 108 (90 BASE) MCG/ACT inhaler Inhale 1 puff into the lungs every 4 (four) hours as needed for wheezing or shortness of breath.    Historical Provider, MD  hydrochlorothiazide (MICROZIDE) 12.5 MG capsule Take 12.5 mg by mouth daily.    Historical Provider, MD  magic mouthwash w/lidocaine SOLN Gargle and spit 1-2 teaspoons every 4 hours prn throat pain.    Note to pharmacy - equal parts diphendydramine, aluminum hydroxide and lidocaine HCL 06/26/15   Evalee Jefferson, PA-C  methocarbamol (ROBAXIN) 500  MG tablet Take 2 tablets (1,000 mg total) by mouth 4 (four) times daily. 06/26/15 07/06/15  Evalee Jefferson, PA-C  oxyCODONE-acetaminophen (PERCOCET/ROXICET) 5-325 MG tablet Take 1 tablet by mouth every 8 (eight) hours as needed for severe pain. 06/16/15   Ripley Fraise, MD  predniSONE (DELTASONE) 10 MG tablet 6, 5, 4, 3, 2 then 1 tablet by mouth daily for 6 days total. 06/26/15   Evalee Jefferson, PA-C   BP 120/96 mmHg  Pulse 87  Temp(Src) 97.6 F (36.4 C) (Oral)   Resp 20  Ht 4\' 11"  (1.499 m)  Wt 61.236 kg  BMI 27.25 kg/m2  SpO2 99%  LMP 06/13/2015 Physical Exam  Constitutional: She is oriented to person, place, and time. She appears well-developed and well-nourished.  HENT:  Head: Normocephalic and atraumatic.  Right Ear: Tympanic membrane and ear canal normal.  Left Ear: Tympanic membrane and ear canal normal.  Nose: No mucosal edema or rhinorrhea.  Mouth/Throat: Uvula is midline, oropharynx is clear and moist and mucous membranes are normal. No oropharyngeal exudate, posterior oropharyngeal edema, posterior oropharyngeal erythema or tonsillar abscesses.  Eyes: Conjunctivae are normal.  Neck: Neck supple. Muscular tenderness present. No spinous process tenderness present. No rigidity. Decreased range of motion present. No erythema present.    ttp with spasm noted right trapezius  Cardiovascular: Normal rate and normal heart sounds.   Pulses equal bilaterally  Pulmonary/Chest: Effort normal. No respiratory distress. She has no decreased breath sounds. She has no wheezes. She has no rhonchi. She has no rales.  Musculoskeletal: She exhibits tenderness.       Right shoulder: She exhibits tenderness and spasm. She exhibits no bony tenderness, no swelling and no deformity.  Trapezius. No radiation to scapula  Lymphadenopathy:    She has no cervical adenopathy.  Neurological: She is alert and oriented to person, place, and time. She has normal strength. She displays normal reflexes. No sensory deficit.  Reflex Scores:      Tricep reflexes are 2+ on the right side and 2+ on the left side.      Bicep reflexes are 2+ on the right side and 2+ on the left side. Equal grip strength  Skin: Skin is warm and dry. No rash noted.  Psychiatric: She has a normal mood and affect.    ED Course  Procedures (including critical care time) Labs Review Labs Reviewed - No data to display  Imaging Review Dg Cervical Spine Complete  06/26/2015  CLINICAL DATA:   Right neck and shoulder pain. EXAM: CERVICAL SPINE - COMPLETE 4+ VIEW COMPARISON:  None. FINDINGS: The pre odontoid space is within normal limits. The prevertebral soft tissues are not thickened. The cervical spine is well seen through the mid C7 vertebral body. Inferior aspect of C7 and the cervical thoracic junction are not well visualized. There is straightening of normal lordosis with no other malalignment. Mild degenerative disc disease is seen with small anterior osteophytes. Small posterior osteophytes are seen at a few levels as well. No significant neural foraminal narrowing. The lateral masses of C1 align with C2. The odontoid process is within normal limits. IMPRESSION: No acute abnormalities. Degenerative changes. The inferior aspect of C7 and the cervical thoracic junction are not visualized. Electronically Signed   By: Dorise Bullion III M.D   On: 06/26/2015 09:15   I have personally reviewed and evaluated these images and lab results as part of my medical decision-making.   EKG Interpretation None      MDM   Final  diagnoses:  Cervical radiculopathy  Viral pharyngitis   Imaging reviewed and discussed with patient.  Advised she needs f/u with pcp who can arrange referral to neurosurgery if pcp felt appropriate.  Pt may need further MRI imaging if sx persist.  She was placed on a prednisone taper, robaxin. Encouraged continued heat tx.  Also prescribed magic mouthwash for suspected viral pharyngitis.  No exam findings to suggest acute tonsillitis or strep.    Evalee Jefferson, PA-C 06/26/15 [id number]  Julianne Rice, MD 06/27/15 [id number]

## 2015-06-26 NOTE — ED Notes (Signed)
Patient complaining of right shoulder pain radiating into her right neck x 3 months. Also complaining of sore throat x 1 week. States "I went to [facility] and they told me I had a pinched nerve."

## 2015-12-18 ENCOUNTER — Emergency Department (HOSPITAL_COMMUNITY)
Admission: EM | Admit: 2015-12-18 | Discharge: 2015-12-18 | Disposition: A | Discharge status: Home / Self Care | Payer: Medicaid Other | Attending: Emergency Medicine | Admitting: Emergency Medicine

## 2015-12-18 ENCOUNTER — Emergency Department (HOSPITAL_COMMUNITY): Payer: Medicaid Other

## 2015-12-18 ENCOUNTER — Encounter (HOSPITAL_COMMUNITY): Payer: Self-pay

## 2015-12-18 DIAGNOSIS — I10 Essential (primary) hypertension: Secondary | ICD-10-CM | POA: Diagnosis not present

## 2015-12-18 DIAGNOSIS — Z87891 Personal history of nicotine dependence: Secondary | ICD-10-CM | POA: Insufficient documentation

## 2015-12-18 DIAGNOSIS — R0789 Other chest pain: Secondary | ICD-10-CM | POA: Insufficient documentation

## 2015-12-18 DIAGNOSIS — M25511 Pain in right shoulder: Secondary | ICD-10-CM | POA: Diagnosis not present

## 2015-12-18 DIAGNOSIS — Z79899 Other long term (current) drug therapy: Secondary | ICD-10-CM | POA: Insufficient documentation

## 2015-12-18 DIAGNOSIS — J45909 Unspecified asthma, uncomplicated: Secondary | ICD-10-CM | POA: Diagnosis not present

## 2015-12-18 DIAGNOSIS — R072 Precordial pain: Secondary | ICD-10-CM | POA: Diagnosis present

## 2015-12-18 LAB — COMPREHENSIVE METABOLIC PANEL
ALK PHOS: 57 U/L (ref 38–126)
ALT: 14 U/L (ref 14–54)
ANION GAP: 8 (ref 5–15)
AST: 17 U/L (ref 15–41)
Albumin: 3.8 g/dL (ref 3.5–5.0)
BILIRUBIN TOTAL: 0.3 mg/dL (ref 0.3–1.2)
BUN: 16 mg/dL (ref 6–20)
CALCIUM: 8.9 mg/dL (ref 8.9–10.3)
CO2: 23 mmol/L (ref 22–32)
Chloride: 106 mmol/L (ref 101–111)
Creatinine, Ser: 0.85 mg/dL (ref 0.44–1.00)
GFR calc non Af Amer: >60 mL/min (ref >60)
Glucose, Bld: 94 mg/dL (ref 65–99)
Potassium: 4 mmol/L (ref 3.5–5.1)
SODIUM: 137 mmol/L (ref 135–145)
TOTAL PROTEIN: 7.3 g/dL (ref 6.5–8.1)

## 2015-12-18 LAB — CBC WITH DIFFERENTIAL/PLATELET
Basophils Absolute: 0 10*3/uL (ref 0.0–0.1)
Basophils Relative: 0 %
Eosinophils Absolute: 0.2 10*3/uL (ref 0.0–0.7)
Eosinophils Relative: 2 %
HCT: 39.3 % (ref 36.0–46.0)
HEMOGLOBIN: 13.2 g/dL (ref 12.0–15.0)
LYMPHS ABS: 3.2 10*3/uL (ref 0.7–4.0)
Lymphocytes Relative: 43 %
MCH: 30.6 pg (ref 26.0–34.0)
MCHC: 33.6 g/dL (ref 30.0–36.0)
MCV: 91.2 fL (ref 78.0–100.0)
MONO ABS: 0.4 10*3/uL (ref 0.1–1.0)
MONOS PCT: 6 %
NEUTROS PCT: 49 %
Neutro Abs: 3.5 10*3/uL (ref 1.7–7.7)
Platelets: 451 10*3/uL — ABNORMAL HIGH (ref 150–400)
RBC: 4.31 MIL/uL (ref 3.87–5.11)
RDW: 13.1 % (ref 11.5–15.5)
WBC: 7.3 10*3/uL (ref 4.0–10.5)

## 2015-12-18 LAB — TROPONIN I: Troponin I: <0.03 ng/mL (ref <0.03)

## 2015-12-18 MED ORDER — MORPHINE SULFATE (PF) 10 MG/ML IV SOLN
6.0000 mg | Freq: Once | INTRAVENOUS | Status: AC
  Administered 2015-12-18: 6 mg via INTRAVENOUS
  Filled 2015-12-18: qty 1

## 2015-12-18 MED ORDER — HYDROMORPHONE HCL 1 MG/ML IJ SOLN
1.0000 mg | Freq: Once | INTRAMUSCULAR | Status: AC
  Administered 2015-12-18: 1 mg via INTRAVENOUS
  Filled 2015-12-18: qty 1

## 2015-12-18 MED ORDER — ONDANSETRON 4 MG PO TBDP: ORAL_TABLET | ORAL | 0 refills | Status: DC

## 2015-12-18 MED ORDER — OXYCODONE-ACETAMINOPHEN 5-325 MG PO TABS: 1.0000 | ORAL_TABLET | Freq: Four times a day (QID) | ORAL | 0 refills | Status: DC | PRN

## 2015-12-18 MED ORDER — NAPROXEN 500 MG PO TABS: 500.0000 mg | ORAL_TABLET | Freq: Two times a day (BID) | ORAL | 0 refills | Status: DC

## 2015-12-18 NOTE — ED Provider Notes (Signed)
[facility] DEPT Provider Note   CSN: [id number] Arrival date & time: 12/18/15  [id number]  By signing my name below, I, Delton Prairie, attest that this documentation has been prepared under the direction and in the presence of Milton Ferguson, MD  Electronically Signed: Delton Prairie, ED Scribe. 12/18/15. 8:47 AM.    History   Chief Complaint Chief Complaint  Patient presents with  . Shoulder Pain    Patient complains of right-sided chest pain and pain in her right arm. Started today   The history is provided by the patient. No language interpreter was used.  Extremity Pain   Chest Pain   This is a new problem. The current episode started 1 to 2 hours ago. The problem occurs constantly. The problem has been gradually worsening. The pain is associated with breathing and movement. The pain is present in the substernal region. The pain is moderate. The quality of the pain is described as sharp. The pain radiates to the right shoulder and right arm. The symptoms are aggravated by certain positions and deep breathing. Pertinent negatives include no abdominal pain, no back pain, no cough and no headaches. She has tried rest for the symptoms. The treatment provided no relief.  Her past medical history is significant for hypertension.  Pertinent negatives for past medical history include no aneurysm and no seizures.     Kathryn [name] is a 45 y.o. female who presents to the Emergency Department complaining of moderate, constant, worsening right chest pain with radiation to the right arm onset at 9 am after she rolled onto her right arm while in bed. She denies any recent injury, trauma or fall. Pt states that the pain is worsened to the touch, with movement and with breathing. No h/o similar symptoms or pain. Pt denies any nausea, fever, vomiting, chills. No alleviating factors reported.    Past Medical History:  Diagnosis Date  . Asthma   . Hypertension     There are no active problems to  display for this patient.   Past Surgical History:  Procedure Laterality Date  . TUBAL LIGATION      OB History    Gravida Para Term Preterm AB Living   5 3 2 1 2 3    SAB TAB Ectopic Multiple Live Births   1 1             Home Medications    Prior to Admission medications   Medication Sig Start Date End Date Taking? Authorizing Provider  acetaminophen (TYLENOL) 500 MG tablet Take 1,000 mg by mouth every 6 (six) hours as needed for moderate pain.     Historical Provider, MD  albuterol (PROVENTIL HFA;VENTOLIN HFA) 108 (90 BASE) MCG/ACT inhaler Inhale 1 puff into the lungs every 4 (four) hours as needed for wheezing or shortness of breath.    Historical Provider, MD  hydrochlorothiazide (MICROZIDE) 12.5 MG capsule Take 12.5 mg by mouth daily.    Historical Provider, MD  magic mouthwash w/lidocaine SOLN Gargle and spit 1-2 teaspoons every 4 hours prn throat pain.    Note to pharmacy - equal parts diphendydramine, aluminum hydroxide and lidocaine HCL 06/26/15   Evalee Jefferson, PA-C  oxyCODONE-acetaminophen (PERCOCET/ROXICET) 5-325 MG tablet Take 1 tablet by mouth every 8 (eight) hours as needed for severe pain. 06/16/15   Ripley Fraise, MD  predniSONE (DELTASONE) 10 MG tablet 6, 5, 4, 3, 2 then 1 tablet by mouth daily for 6 days total. 06/26/15   Evalee Jefferson, PA-C  Family History No family history on file.  Social History Social History  Substance Use Topics  . Smoking status: Former Smoker    Types: Cigarettes  . Smokeless tobacco: Never Used  . Alcohol use No     Allergies   Clonopin [clonazepam]; Fish allergy; Flexeril [cyclobenzaprine hcl]; Ibuprofen; and Tramadol   Review of Systems Review of Systems  Constitutional: Negative for appetite change, chills and fatigue.  HENT: Negative for congestion, ear discharge and sinus pressure.   Eyes: Negative for discharge.  Respiratory: Negative for cough.   Cardiovascular: Positive for chest pain.  Gastrointestinal: Negative  for abdominal pain and diarrhea.  Genitourinary: Negative for frequency and hematuria.  Musculoskeletal: Positive for myalgias (shoulder pain). Negative for back pain.  Skin: Negative for rash.  Neurological: Negative for seizures and headaches.  Psychiatric/Behavioral: Negative for hallucinations.     Physical Exam Updated Vital Signs BP 140/82   Pulse 76   Temp 97.5 F (36.4 C) (Oral)   Resp 18   Ht 4\' 11"  (1.499 m)   Wt 135 lb (61.2 kg)   LMP 12/12/2015   SpO2 100%   BMI 27.27 kg/m   Physical Exam  Constitutional: She is oriented to person, place, and time. She appears well-developed.  HENT:  Head: Normocephalic.  Eyes: Conjunctivae and EOM are normal. No scleral icterus.  Neck: Neck supple. No thyromegaly present.  Cardiovascular: Normal rate and regular rhythm.  Exam reveals no gallop and no friction rub.   No murmur heard. Pulmonary/Chest: No stridor. She has no wheezes. She has no rales. She exhibits tenderness.  Very tender to right anterior chest  Abdominal: She exhibits no distension. There is no tenderness. There is no rebound.  Musculoskeletal: Normal range of motion. She exhibits tenderness. She exhibits no edema.  Tender to right shoulder  Lymphadenopathy:    She has no cervical adenopathy.  Neurological: She is oriented to person, place, and time. She exhibits normal muscle tone. Coordination normal.  Skin: No rash noted. No erythema.  Psychiatric: She has a normal mood and affect. Her behavior is normal.  Nursing note and vitals reviewed.    ED Treatments / Results  DIAGNOSTIC STUDIES:  Oxygen Saturation is 100% on room air, normal by my interpretation.    COORDINATION OF CARE:  8:40 AM Will order lab work and Chest  XR.Discussed treatment plan with pt at bedside and pt agreed to plan.  Labs (all labs ordered are listed, but only abnormal results are displayed) Labs Reviewed  CBC WITH DIFFERENTIAL/PLATELET  COMPREHENSIVE METABOLIC PANEL    TROPONIN I    EKG  EKG Interpretation None       Radiology No results found.  Procedures Procedures (including critical care time)  Medications Ordered in ED Medications  HYDROmorphone (DILAUDID) injection 1 mg (not administered)     Initial Impression / Assessment and Plan / ED Course  I have reviewed the triage vital signs and the nursing notes.  Pertinent labs & imaging results that were available during my care of the patient were reviewed by me and considered in my medical decision making (see chart for details).  Clinical Course    Labs EKG unremarkable. Patient with chest wall pain. She'll be treated with Naprosyn and Percocet and will follow-up with PCP  Final Clinical Impressions(s) / ED Diagnoses   Final diagnoses:  None    New Prescriptions New Prescriptions   No medications on file   The chart was scribed for me under my direct supervision.  I personally performed the history, physical, and medical decision making and all procedures in the evaluation of this patient.Milton Ferguson, MD 12/18/15 [phone]

## 2015-12-18 NOTE — Discharge Instructions (Signed)
Follow-up with your family doctor later this week

## 2015-12-18 NOTE — ED Triage Notes (Signed)
Pt reports rolled over in bed this morning and felt sharp pain in r shoulder and radiates to r chest.  Pain worse with movement and breathing.  Denies injury.

## 2016-01-12 ENCOUNTER — Emergency Department (HOSPITAL_COMMUNITY)
Admission: EM | Admit: 2016-01-12 | Discharge: 2016-01-12 | Disposition: A | Discharge status: Home / Self Care | Payer: Medicaid Other | Attending: Emergency Medicine | Admitting: Emergency Medicine

## 2016-01-12 ENCOUNTER — Emergency Department (HOSPITAL_COMMUNITY): Payer: Medicaid Other

## 2016-01-12 ENCOUNTER — Encounter (HOSPITAL_COMMUNITY): Payer: Self-pay

## 2016-01-12 DIAGNOSIS — I10 Essential (primary) hypertension: Secondary | ICD-10-CM | POA: Insufficient documentation

## 2016-01-12 DIAGNOSIS — Z87891 Personal history of nicotine dependence: Secondary | ICD-10-CM | POA: Diagnosis not present

## 2016-01-12 DIAGNOSIS — J45909 Unspecified asthma, uncomplicated: Secondary | ICD-10-CM | POA: Diagnosis not present

## 2016-01-12 DIAGNOSIS — M62838 Other muscle spasm: Secondary | ICD-10-CM | POA: Insufficient documentation

## 2016-01-12 DIAGNOSIS — M79651 Pain in right thigh: Secondary | ICD-10-CM | POA: Diagnosis present

## 2016-01-12 LAB — COMPREHENSIVE METABOLIC PANEL
ALT: 14 U/L (ref 14–54)
ANION GAP: 5 (ref 5–15)
AST: 18 U/L (ref 15–41)
Albumin: 3.6 g/dL (ref 3.5–5.0)
Alkaline Phosphatase: 53 U/L (ref 38–126)
BILIRUBIN TOTAL: 0.2 mg/dL — AB (ref 0.3–1.2)
BUN: 18 mg/dL (ref 6–20)
CO2: 25 mmol/L (ref 22–32)
Calcium: 8.6 mg/dL — ABNORMAL LOW (ref 8.9–10.3)
Chloride: 108 mmol/L (ref 101–111)
Creatinine, Ser: 0.69 mg/dL (ref 0.44–1.00)
GFR calc Af Amer: >60 mL/min (ref >60)
Glucose, Bld: 102 mg/dL — ABNORMAL HIGH (ref 65–99)
POTASSIUM: 3.7 mmol/L (ref 3.5–5.1)
Sodium: 138 mmol/L (ref 135–145)
TOTAL PROTEIN: 6.8 g/dL (ref 6.5–8.1)

## 2016-01-12 LAB — CBC WITH DIFFERENTIAL/PLATELET
Basophils Absolute: 0 10*3/uL (ref 0.0–0.1)
Basophils Relative: 0 %
Eosinophils Absolute: 0.1 10*3/uL (ref 0.0–0.7)
Eosinophils Relative: 2 %
HEMATOCRIT: 35.7 % — AB (ref 36.0–46.0)
Hemoglobin: 12 g/dL (ref 12.0–15.0)
LYMPHS ABS: 2.8 10*3/uL (ref 0.7–4.0)
LYMPHS PCT: 45 %
MCH: 30.5 pg (ref 26.0–34.0)
MCHC: 33.6 g/dL (ref 30.0–36.0)
MCV: 90.6 fL (ref 78.0–100.0)
MONO ABS: 0.4 10*3/uL (ref 0.1–1.0)
MONOS PCT: 6 %
NEUTROS ABS: 2.9 10*3/uL (ref 1.7–7.7)
Neutrophils Relative %: 47 %
Platelets: 449 10*3/uL — ABNORMAL HIGH (ref 150–400)
RBC: 3.94 MIL/uL (ref 3.87–5.11)
RDW: 12.9 % (ref 11.5–15.5)
WBC: 6.3 10*3/uL (ref 4.0–10.5)

## 2016-01-12 LAB — D-DIMER, QUANTITATIVE: D-Dimer, Quant: <0.27 ug/mL-FEU (ref 0.00–0.50)

## 2016-01-12 MED ORDER — METHOCARBAMOL 500 MG PO TABS: 500.0000 mg | ORAL_TABLET | Freq: Two times a day (BID) | ORAL | 0 refills | Status: DC

## 2016-01-12 MED ORDER — HYDROCODONE-ACETAMINOPHEN 5-325 MG PO TABS
2.0000 | ORAL_TABLET | Freq: Once | ORAL | Status: AC
  Administered 2016-01-12: 2 via ORAL
  Filled 2016-01-12: qty 2

## 2016-01-12 NOTE — ED Provider Notes (Signed)
[facility] DEPT Provider Note   CSN: [id number] Arrival date & time: 01/12/16  0745     History   Chief Complaint Chief Complaint  Patient presents with  . Leg Pain    HPI Kathryn [name] is a 45 y.o. female.  The history is provided by the patient. No language interpreter was used.  Leg Pain   The current episode started 12 to 24 hours ago. The problem occurs constantly. The problem has been gradually worsening. The pain is present in the right upper leg. The quality of the pain is described as aching. Associated symptoms include stiffness. Pertinent negatives include no numbness and full range of motion. She has tried nothing for the symptoms. The treatment provided no relief. There has been no history of extremity trauma.   Pt points to varicose veins inner leg as area of pain.   Pt complains of severe pain.  Past Medical History:  Diagnosis Date  . Asthma   . Hypertension     There are no active problems to display for this patient.   Past Surgical History:  Procedure Laterality Date  . TUBAL LIGATION      OB History    Gravida Para Term Preterm AB Living   5 3 2 1 2 3    SAB TAB Ectopic Multiple Live Births   1 1             Home Medications    Prior to Admission medications   Medication Sig Start Date End Date Taking? Authorizing Provider  acetaminophen (TYLENOL) 500 MG tablet Take 1,000 mg by mouth every 6 (six) hours as needed for moderate pain.     Historical Provider, MD  albuterol (PROVENTIL HFA;VENTOLIN HFA) 108 (90 BASE) MCG/ACT inhaler Inhale 1 puff into the lungs every 4 (four) hours as needed for wheezing or shortness of breath.    Historical Provider, MD  hydrochlorothiazide (MICROZIDE) 12.5 MG capsule Take 12.5 mg by mouth daily.    Historical Provider, MD  magic mouthwash w/lidocaine SOLN Gargle and spit 1-2 teaspoons every 4 hours prn throat pain.    Note to pharmacy - equal parts diphendydramine, aluminum hydroxide and lidocaine  HCL Patient not taking: Reported on 12/18/2015 06/26/15   Evalee Jefferson, PA-C  methocarbamol (ROBAXIN) 500 MG tablet Take 1 tablet (500 mg total) by mouth 2 (two) times daily. 01/12/16   Fransico Meadow, PA-C  naproxen (NAPROSYN) 500 MG tablet Take 1 tablet (500 mg total) by mouth 2 (two) times daily. 12/18/15   Milton Ferguson, MD  ondansetron (ZOFRAN ODT) 4 MG disintegrating tablet 4mg  ODT q4 hours prn nausea/vomit 12/18/15   Milton Ferguson, MD  oxyCODONE-acetaminophen (PERCOCET/ROXICET) 5-325 MG tablet Take 1 tablet by mouth every 6 (six) hours as needed. 12/18/15   Milton Ferguson, MD  predniSONE (DELTASONE) 10 MG tablet 6, 5, 4, 3, 2 then 1 tablet by mouth daily for 6 days total. Patient not taking: Reported on 12/18/2015 06/26/15   Evalee Jefferson, PA-C    Family History No family history on file.  Social History Social History  Substance Use Topics  . Smoking status: Former Smoker    Types: Cigarettes  . Smokeless tobacco: Never Used  . Alcohol use Yes     Comment: occ     Allergies   Clonopin [clonazepam]; Fish allergy; Flexeril [cyclobenzaprine hcl]; Ibuprofen; and Tramadol   Review of Systems Review of Systems  Musculoskeletal: Positive for stiffness.  Neurological: Negative for numbness.  All other systems reviewed and  are negative.    Physical Exam Updated Vital Signs BP 147/94 (BP Location: Left Arm)   Pulse 61   Temp 98 F (36.7 C)   Resp 17   Ht 4\' 11"  (1.499 m)   Wt 61.2 kg   LMP 01/09/2016   SpO2 99%   BMI 27.27 kg/m   Physical Exam  Constitutional: She appears well-developed and well-nourished. No distress.  HENT:  Head: Normocephalic and atraumatic.  Eyes: Conjunctivae are normal.  Neck: Neck supple.  Cardiovascular: Normal rate and regular rhythm.   No murmur heard. Pulmonary/Chest: Effort normal. No respiratory distress.  Abdominal: Soft. There is no tenderness.  Musculoskeletal: She exhibits tenderness. She exhibits no edema.  Tender right medial thigh.  Pain  to palpation,  No erythema,  Small varicose veins both inner thighs, no evidence of inflammation,  From  Negative homan's   Neurological: She is alert.  Skin: Skin is warm and dry.  Psychiatric: She has a normal mood and affect.  Nursing note and vitals reviewed.    ED Treatments / Results  Labs (all labs ordered are listed, but only abnormal results are displayed) Labs Reviewed  CBC WITH DIFFERENTIAL/PLATELET - Abnormal; Notable for the following:       Result Value   HCT 35.7 (*)    Platelets 449 (*)    All other components within normal limits  COMPREHENSIVE METABOLIC PANEL - Abnormal; Notable for the following:    Glucose, Bld 102 (*)    Calcium 8.6 (*)    Total Bilirubin 0.2 (*)    All other components within normal limits  D-DIMER, QUANTITATIVE (NOT AT [facility])    EKG  EKG Interpretation None       Radiology Dg Femur Min 2 Views Right  Result Date: 01/12/2016 CLINICAL DATA:  Right thigh pain EXAM: RIGHT FEMUR 2 VIEWS COMPARISON:  None. FINDINGS: No fracture or dislocation is seen. Well corticated avulsion along the medial femoral condyle from old MCL avulsion. Visualized soft tissues are within normal limits. IMPRESSION: No acute osseus abnormality is seen. Sequela of old MCL avulsion injury. Electronically Signed   By: Julian Hy M.D.   On: 01/12/2016 08:53    Procedures Procedures (including critical care time)  Medications Ordered in ED Medications  HYDROcodone-acetaminophen (NORCO/VICODIN) 5-325 MG per tablet 2 tablet (2 tablets Oral Given 01/12/16 0905)     Initial Impression / Assessment and Plan / ED Course  I have reviewed the triage vital signs and the nursing notes.  Pertinent labs & imaging results that were available during my care of the patient were reviewed by me and considered in my medical decision making (see chart for details).  Clinical Course    D dimer is negative, Pt advised to follow up with her MD.   Final Clinical  Impressions(s) / ED Diagnoses   Final diagnoses:  Muscle spasm of right leg    New Prescriptions Discharge Medication List as of 01/12/2016 10:03 AM    START taking these medications   Details  methocarbamol (ROBAXIN) 500 MG tablet Take 1 tablet (500 mg total) by mouth 2 (two) times daily., Starting Wed 01/12/2016, Print         Hollace Kinnier Kane, PA-C 01/12/16 1025    Dorie Rank, MD 01/14/16 [phone]

## 2016-01-12 NOTE — ED Notes (Signed)
No warmth more to right leg than left leg.

## 2016-04-15 ENCOUNTER — Emergency Department (HOSPITAL_COMMUNITY)
Admission: EM | Admit: 2016-04-15 | Discharge: 2016-04-15 | Disposition: A | Discharge status: Home / Self Care | Payer: Medicaid Other | Attending: Emergency Medicine | Admitting: Emergency Medicine

## 2016-04-15 ENCOUNTER — Encounter (HOSPITAL_COMMUNITY): Payer: Self-pay | Admitting: *Deleted

## 2016-04-15 DIAGNOSIS — N390 Urinary tract infection, site not specified: Secondary | ICD-10-CM | POA: Diagnosis not present

## 2016-04-15 DIAGNOSIS — I10 Essential (primary) hypertension: Secondary | ICD-10-CM | POA: Insufficient documentation

## 2016-04-15 DIAGNOSIS — J069 Acute upper respiratory infection, unspecified: Secondary | ICD-10-CM | POA: Diagnosis not present

## 2016-04-15 DIAGNOSIS — R0981 Nasal congestion: Secondary | ICD-10-CM | POA: Diagnosis present

## 2016-04-15 DIAGNOSIS — Z87891 Personal history of nicotine dependence: Secondary | ICD-10-CM | POA: Diagnosis not present

## 2016-04-15 DIAGNOSIS — J45909 Unspecified asthma, uncomplicated: Secondary | ICD-10-CM | POA: Insufficient documentation

## 2016-04-15 DIAGNOSIS — Z79899 Other long term (current) drug therapy: Secondary | ICD-10-CM | POA: Insufficient documentation

## 2016-04-15 LAB — URINALYSIS, ROUTINE W REFLEX MICROSCOPIC
BILIRUBIN URINE: NEGATIVE
GLUCOSE, UA: NEGATIVE mg/dL
HGB URINE DIPSTICK: NEGATIVE
KETONES UR: NEGATIVE mg/dL
NITRITE: NEGATIVE
PH: 5 (ref 5.0–8.0)
Protein, ur: NEGATIVE mg/dL
SPECIFIC GRAVITY, URINE: 1.024 (ref 1.005–1.030)

## 2016-04-15 LAB — POC URINE PREG, ED: Preg Test, Ur: NEGATIVE

## 2016-04-15 MED ORDER — GUAIFENESIN-CODEINE 100-10 MG/5ML PO SYRP: 10.0000 mL | ORAL_SOLUTION | Freq: Three times a day (TID) | ORAL | 0 refills | Status: DC | PRN

## 2016-04-15 MED ORDER — CEPHALEXIN 500 MG PO CAPS
500.0000 mg | ORAL_CAPSULE | Freq: Once | ORAL | Status: AC
  Administered 2016-04-15: 500 mg via ORAL
  Filled 2016-04-15: qty 1

## 2016-04-15 MED ORDER — CEPHALEXIN 500 MG PO CAPS: 500.0000 mg | ORAL_CAPSULE | Freq: Four times a day (QID) | ORAL | 0 refills | Status: DC

## 2016-04-15 NOTE — Discharge Instructions (Signed)
Tylenol every 4 hrs as needed for body aches or fever.  Try to drink water.  Take the antibiotic as directed until its finished.  Return here for any worsening symptoms
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
## 2016-04-18 LAB — URINE CULTURE

## 2016-04-23 ENCOUNTER — Emergency Department (HOSPITAL_COMMUNITY)
Admission: EM | Admit: 2016-04-23 | Discharge: 2016-04-23 | Disposition: A | Discharge status: Home / Self Care | Payer: Medicaid Other | Attending: Emergency Medicine | Admitting: Emergency Medicine

## 2016-04-23 ENCOUNTER — Encounter (HOSPITAL_COMMUNITY): Payer: Self-pay | Admitting: Emergency Medicine

## 2016-04-23 ENCOUNTER — Emergency Department (HOSPITAL_COMMUNITY): Payer: Medicaid Other

## 2016-04-23 DIAGNOSIS — Z79899 Other long term (current) drug therapy: Secondary | ICD-10-CM | POA: Diagnosis not present

## 2016-04-23 DIAGNOSIS — I10 Essential (primary) hypertension: Secondary | ICD-10-CM | POA: Insufficient documentation

## 2016-04-23 DIAGNOSIS — J209 Acute bronchitis, unspecified: Secondary | ICD-10-CM | POA: Diagnosis not present

## 2016-04-23 DIAGNOSIS — J45909 Unspecified asthma, uncomplicated: Secondary | ICD-10-CM | POA: Diagnosis not present

## 2016-04-23 DIAGNOSIS — R05 Cough: Secondary | ICD-10-CM | POA: Diagnosis present

## 2016-04-23 DIAGNOSIS — Z87891 Personal history of nicotine dependence: Secondary | ICD-10-CM | POA: Diagnosis not present

## 2016-04-23 MED ORDER — BENZONATATE 100 MG PO CAPS: 200.0000 mg | ORAL_CAPSULE | Freq: Three times a day (TID) | ORAL | 0 refills | Status: DC | PRN

## 2016-04-23 MED ORDER — PREDNISONE 50 MG PO TABS: ORAL_TABLET | ORAL | 0 refills | Status: DC

## 2016-04-23 MED ORDER — ALBUTEROL SULFATE HFA 108 (90 BASE) MCG/ACT IN AERS: 1.0000 | INHALATION_SPRAY | Freq: Four times a day (QID) | RESPIRATORY_TRACT | 0 refills | Status: DC | PRN

## 2016-04-23 MED ORDER — ALBUTEROL SULFATE (2.5 MG/3ML) 0.083% IN NEBU
2.5000 mg | INHALATION_SOLUTION | Freq: Once | RESPIRATORY_TRACT | Status: AC
  Administered 2016-04-23: 2.5 mg via RESPIRATORY_TRACT
  Filled 2016-04-23: qty 3

## 2016-04-23 MED ORDER — PREDNISONE 50 MG PO TABS
60.0000 mg | ORAL_TABLET | Freq: Once | ORAL | Status: AC
  Administered 2016-04-23: 60 mg via ORAL
  Filled 2016-04-23: qty 1

## 2016-04-23 MED ORDER — PREDNISONE 50 MG PO TABS: 60.0000 mg | ORAL_TABLET | Freq: Once | ORAL | Status: DC

## 2016-04-23 MED ORDER — IPRATROPIUM-ALBUTEROL 0.5-2.5 (3) MG/3ML IN SOLN
3.0000 mL | Freq: Once | RESPIRATORY_TRACT | Status: AC
  Administered 2016-04-23: 3 mL via RESPIRATORY_TRACT
  Filled 2016-04-23: qty 3

## 2016-04-23 NOTE — ED Provider Notes (Signed)
[facility] DEPT Provider Note   CSN: [id number] Arrival date & time: 04/23/16  1133   By signing my name below, I, Collene Leyden, attest that this documentation has been prepared under the direction and in the presence of Evalee Jefferson, PA-C. Electronically Signed: Collene Leyden, Scribe. 04/23/16. 12:51 PM.  History   Chief Complaint Chief Complaint  Patient presents with  . Cough    congestion   HPI Comments: Kathryn [name] is a 46 y.o. female with a hx of asthma and HTN,  who presents to the Emergency Department complaining of an intermittent productive cough with yellow phlegm that began 3 days ago. She also reports persistent intermittent wheezing, midsternal chest and bilateral abdominal soreness triggered by coughing, stating she believes she pulled some muscles with the frequency of cough episodes.  Patient reports using her nebulizer treatment once a day with no improvement (last one yesterday). She denies any vomiting, dysuria,  hematuria, fever, nasal or sinus congestion, headache or ear pain.   The history is provided by the patient. No language interpreter was used.    Past Medical History:  Diagnosis Date  . Asthma   . Hypertension     There are no active problems to display for this patient.   Past Surgical History:  Procedure Laterality Date  . TUBAL LIGATION      OB History    Gravida Para Term Preterm AB Living   5 3 2 1 2 3    SAB TAB Ectopic Multiple Live Births   1 1             Home Medications    Prior to Admission medications   Medication Sig Start Date End Date Taking? Authorizing Provider  acetaminophen (TYLENOL) 500 MG tablet Take 1,000 mg by mouth every 6 (six) hours as needed for moderate pain.     Historical Provider, MD  albuterol (PROVENTIL HFA;VENTOLIN HFA) 108 (90 Base) MCG/ACT inhaler Inhale 1-2 puffs into the lungs every 6 (six) hours as needed for wheezing or shortness of breath. 04/23/16   Evalee Jefferson, PA-C  benzonatate (TESSALON)  100 MG capsule Take 2 capsules (200 mg total) by mouth 3 (three) times daily as needed. 04/23/16   Evalee Jefferson, PA-C  cephALEXin (KEFLEX) 500 MG capsule Take 1 capsule (500 mg total) by mouth 4 (four) times daily. For 7 days 04/15/16   Tammy Triplett, PA-C  guaiFENesin-codeine (ROBITUSSIN AC) 100-10 MG/5ML syrup Take 10 mLs by mouth 3 (three) times daily as needed. For cough 04/15/16   Tammy Triplett, PA-C  hydrochlorothiazide (MICROZIDE) 12.5 MG capsule Take 12.5 mg by mouth daily.    Historical Provider, MD  magic mouthwash w/lidocaine SOLN Gargle and spit 1-2 teaspoons every 4 hours prn throat pain.    Note to pharmacy - equal parts diphendydramine, aluminum hydroxide and lidocaine HCL Patient not taking: Reported on 12/18/2015 06/26/15   Evalee Jefferson, PA-C  methocarbamol (ROBAXIN) 500 MG tablet Take 1 tablet (500 mg total) by mouth 2 (two) times daily. 01/12/16   Fransico Meadow, PA-C  naproxen (NAPROSYN) 500 MG tablet Take 1 tablet (500 mg total) by mouth 2 (two) times daily. 12/18/15   Milton Ferguson, MD  ondansetron (ZOFRAN ODT) 4 MG disintegrating tablet 4mg  ODT q4 hours prn nausea/vomit 12/18/15   Milton Ferguson, MD  oxyCODONE-acetaminophen (PERCOCET/ROXICET) 5-325 MG tablet Take 1 tablet by mouth every 6 (six) hours as needed. 12/18/15   Milton Ferguson, MD  predniSONE (DELTASONE) 50 MG tablet Take one tablet by mouth  daily for 4 days 04/23/16   Evalee Jefferson, PA-C    Family History No family history on file.  Social History Social History  Substance Use Topics  . Smoking status: Former Smoker    Types: Cigarettes  . Smokeless tobacco: Never Used  . Alcohol use Yes     Comment: occ     Allergies   Clonopin [clonazepam]; Fish allergy; Flexeril [cyclobenzaprine hcl]; Ibuprofen; and Tramadol   Review of Systems Review of Systems  Constitutional: Negative for fever.  HENT: Positive for congestion and sore throat. Negative for ear pain.   Respiratory: Positive for cough and wheezing.     Gastrointestinal: Positive for nausea. Negative for abdominal pain, diarrhea and vomiting.  Genitourinary: Negative for hematuria.  Neurological: Positive for weakness.     Physical Exam Updated Vital Signs BP 140/79 (BP Location: Left Arm)   Pulse 69   Temp 98.5 F (36.9 C) (Oral)   Resp 16   Ht 4\' 11"  (1.499 m)   Wt 61.2 kg   SpO2 99%   BMI 27.27 kg/m   Physical Exam  Constitutional: She is oriented to person, place, and time. She appears well-developed and well-nourished.  HENT:  Head: Normocephalic and atraumatic.  Mouth/Throat: Oropharynx is clear and moist. No oropharyngeal exudate.  Right tonsillar adenopathy, no erythema.   Eyes: Conjunctivae are normal.  Neck: Normal range of motion. Neck supple.  Cardiovascular: Normal rate.   Pulmonary/Chest: Effort normal. She has wheezes. She has no rales.  Bilateral expiratory wheezes with prolonged expirations and decreased breath sound throughout.  No rhonchi.    Abdominal: Soft. Bowel sounds are normal. She exhibits no distension. There is no tenderness. There is no guarding.  Musculoskeletal: Normal range of motion.  Neurological: She is alert and oriented to person, place, and time.  Skin: Skin is warm and dry.  Psychiatric: She has a normal mood and affect.  Nursing note and vitals reviewed.    ED Treatments / Results  DIAGNOSTIC STUDIES: Oxygen Saturation is 99% on RA, normal by my interpretation.    COORDINATION OF CARE: 12:47 PM Discussed treatment plan with pt at bedside and pt agreed to plan.  Labs (all labs ordered are listed, but only abnormal results are displayed) Labs Reviewed - No data to display  EKG  EKG Interpretation None       Radiology   Dg Chest 2 View  Result Date: 04/23/2016 CLINICAL DATA:  Cough and congestion for 2 days. EXAM: CHEST  2 VIEW COMPARISON:  PA and lateral chest 12/18/2015 and 06/12/2013. FINDINGS: Lungs are clear. Heart size is normal. No pneumothorax or pleural  effusion. No bony abnormality. IMPRESSION: Negative chest. Electronically Signed   By: Inge Rise M.D.   On: 04/23/2016 13:33     Procedures Procedures (including critical care time)  Medications Ordered in ED Medications  ipratropium-albuterol (DUONEB) 0.5-2.5 (3) MG/3ML nebulizer solution 3 mL (3 mLs Nebulization Given 04/23/16 1328)  albuterol (PROVENTIL) (2.5 MG/3ML) 0.083% nebulizer solution 2.5 mg (2.5 mg Nebulization Given 04/23/16 1328)  predniSONE (DELTASONE) tablet 60 mg (60 mg Oral Given 04/23/16 1314)     Initial Impression / Assessment and Plan / ED Course  I have reviewed the triage vital signs and the nursing notes.  Pertinent labs & imaging results that were available during my care of the patient were reviewed by me and considered in my medical decision making (see chart for details).  Clinical Course     Pt given albuterol/atrovent neb, prednisone PO  with near complete resolution of wheezing.  She felt improved and ready for dc after this tx. Prescribed additional prednisone for pulse dosing. Tessalon for cough, albuterol mdi to use q 4 hours when not able to use her neb.  Prn f/u here for any worsened sx, or her pcp as needed.  The patient appears reasonably screened and/or stabilized for discharge and I doubt any other medical condition or other [facility] requiring further screening, evaluation, or treatment in the ED at this time prior to discharge.   Final Clinical Impressions(s) / ED Diagnoses   Final diagnoses:  Acute bronchitis, unspecified organism    New Prescriptions Discharge Medication List as of 04/23/2016  2:31 PM    START taking these medications   Details  benzonatate (TESSALON) 100 MG capsule Take 2 capsules (200 mg total) by mouth 3 (three) times daily as needed., Starting Sun 04/23/2016, Print       I personally performed the services described in this documentation, which was scribed in my presence. The recorded information has been reviewed and  is accurate.    Evalee Jefferson, PA-C 04/25/16 [place], MD 04/26/16 [phone]

## 2016-04-23 NOTE — Discharge Instructions (Signed)
Take your next dose of the prednisone tomorrow evening.  Use your inhaler or your nebulizer every 4 hours if you are wheezing.

## 2016-04-23 NOTE — ED Triage Notes (Signed)
Pt states that she has been having cough and congestion x 2 days.  She is coughing up yellow phlegm.  She denies fevers and chills.  She states that she is having chest pains and abdominal pain that is coming from coughing.

## 2016-08-04 ENCOUNTER — Encounter (HOSPITAL_COMMUNITY): Payer: Self-pay | Admitting: *Deleted

## 2016-08-04 ENCOUNTER — Emergency Department (HOSPITAL_COMMUNITY)
Admission: EM | Admit: 2016-08-04 | Discharge: 2016-08-04 | Disposition: A | Discharge status: Home / Self Care | Payer: Medicaid Other | Attending: Emergency Medicine | Admitting: Emergency Medicine

## 2016-08-04 DIAGNOSIS — Z87891 Personal history of nicotine dependence: Secondary | ICD-10-CM | POA: Diagnosis not present

## 2016-08-04 DIAGNOSIS — M5441 Lumbago with sciatica, right side: Secondary | ICD-10-CM | POA: Diagnosis not present

## 2016-08-04 DIAGNOSIS — R059 Cough, unspecified: Secondary | ICD-10-CM

## 2016-08-04 DIAGNOSIS — J45909 Unspecified asthma, uncomplicated: Secondary | ICD-10-CM | POA: Insufficient documentation

## 2016-08-04 DIAGNOSIS — R0981 Nasal congestion: Secondary | ICD-10-CM | POA: Diagnosis not present

## 2016-08-04 DIAGNOSIS — R05 Cough: Secondary | ICD-10-CM | POA: Diagnosis not present

## 2016-08-04 DIAGNOSIS — Z79899 Other long term (current) drug therapy: Secondary | ICD-10-CM | POA: Insufficient documentation

## 2016-08-04 DIAGNOSIS — I10 Essential (primary) hypertension: Secondary | ICD-10-CM | POA: Insufficient documentation

## 2016-08-04 DIAGNOSIS — M545 Low back pain: Secondary | ICD-10-CM | POA: Diagnosis present

## 2016-08-04 DIAGNOSIS — M5431 Sciatica, right side: Secondary | ICD-10-CM

## 2016-08-04 MED ORDER — METHOCARBAMOL 500 MG PO TABS: 500.0000 mg | ORAL_TABLET | Freq: Three times a day (TID) | ORAL | 0 refills | Status: DC

## 2016-08-04 MED ORDER — METHOCARBAMOL 500 MG PO TABS
500.0000 mg | ORAL_TABLET | Freq: Once | ORAL | Status: AC
  Administered 2016-08-04: 500 mg via ORAL
  Filled 2016-08-04: qty 1

## 2016-08-04 MED ORDER — ALBUTEROL SULFATE HFA 108 (90 BASE) MCG/ACT IN AERS
2.0000 | INHALATION_SPRAY | Freq: Once | RESPIRATORY_TRACT | Status: AC
  Administered 2016-08-04: 2 via RESPIRATORY_TRACT
  Filled 2016-08-04: qty 6.7

## 2016-08-04 MED ORDER — PREDNISONE 10 MG PO TABS: ORAL_TABLET | ORAL | 0 refills | Status: DC

## 2016-08-04 MED ORDER — HYDROCODONE-ACETAMINOPHEN 5-325 MG PO TABS
1.0000 | ORAL_TABLET | Freq: Once | ORAL | Status: AC
  Administered 2016-08-04: 1 via ORAL
  Filled 2016-08-04: qty 1

## 2016-08-04 NOTE — ED Triage Notes (Signed)
Pt c/o lower back pain that started Tuesday this week

## 2016-08-04 NOTE — ED Provider Notes (Signed)
[facility] DEPT Provider Note   CSN: [id number] Arrival date & time: 08/04/16  1137     History   Chief Complaint Chief Complaint  Patient presents with  . Back Pain    HPI Kathryn [name] is a 46 y.o. female.  HPI  Kathryn [name] is a 46 y.o. female who presents to the Emergency Department complaining of low back pain that radiates into the right buttocks and thigh. Pain has been present for 3 days. Pain is associated with movement and weightbearing. Improves at rest. She describes a throbbing pain that is constant.  She denies known injury, abd pain, numbness or weakness of the lower extremities, urine or bowel changes and fever.     Past Medical History:  Diagnosis Date  . Asthma   . Hypertension     There are no active problems to display for this patient.   Past Surgical History:  Procedure Laterality Date  . TUBAL LIGATION      OB History    Gravida Para Term Preterm AB Living   5 3 2 1 2 3    SAB TAB Ectopic Multiple Live Births   1 1             Home Medications    Prior to Admission medications   Medication Sig Start Date End Date Taking? Authorizing Provider  acetaminophen (TYLENOL) 500 MG tablet Take 1,000 mg by mouth every 6 (six) hours as needed for moderate pain.    Yes Historical Provider, MD  albuterol (PROVENTIL HFA;VENTOLIN HFA) 108 (90 Base) MCG/ACT inhaler Inhale 1-2 puffs into the lungs every 6 (six) hours as needed for wheezing or shortness of breath. 04/23/16  Yes Evalee Jefferson, PA-C  hydrochlorothiazide (MICROZIDE) 12.5 MG capsule Take 12.5 mg by mouth daily.   Yes Historical Provider, MD    Family History History reviewed. No pertinent family history.  Social History Social History  Substance Use Topics  . Smoking status: Former Smoker    Types: Cigarettes  . Smokeless tobacco: Never Used  . Alcohol use Yes     Comment: occ     Allergies   Clonopin [clonazepam]; Fish allergy; Flexeril [cyclobenzaprine hcl]; Ibuprofen; and  Tramadol   Review of Systems Review of Systems  Constitutional: Negative for chills and fever.  HENT: Positive for congestion.   Respiratory: Positive for cough. Negative for chest tightness and shortness of breath.   Cardiovascular: Negative for chest pain.  Gastrointestinal: Negative for abdominal pain, constipation and vomiting.  Genitourinary: Negative for decreased urine volume, difficulty urinating, dysuria, flank pain and hematuria.  Musculoskeletal: Positive for back pain. Negative for joint swelling.  Skin: Negative for rash.  Neurological: Negative for weakness and numbness.  All other systems reviewed and are negative.    Physical Exam Updated Vital Signs BP 128/69   Pulse 85   Temp 98.1 F (36.7 C) (Oral)   Resp 18   Ht 4\' 11"  (1.499 m)   Wt 61.2 kg   LMP 08/01/2016   SpO2 98%   BMI 27.27 kg/m   Physical Exam  Constitutional: She is oriented to person, place, and time. She appears well-developed and well-nourished. No distress.  HENT:  Head: Normocephalic and atraumatic.  Neck: Normal range of motion. Neck supple.  Cardiovascular: Normal rate, regular rhythm, normal heart sounds and intact distal pulses.   No murmur heard. Pulmonary/Chest: Effort normal and breath sounds normal. No respiratory distress. She has no wheezes.  Coarse lung sounds bilaterally, no rales or wheezes.  Abdominal: Soft. She exhibits no distension. There is no tenderness.  Musculoskeletal: She exhibits tenderness. She exhibits no edema.       Lumbar back: She exhibits tenderness and pain. She exhibits normal range of motion, no swelling, no deformity, no laceration and normal pulse.  ttp of the right lumbar paraspinal muscles and SI joint.  No spinal tenderness.  DP pulses are brisk and symmetrical.  Distal sensation intact.  Pt has 5/5 strength against resistance of bilateral lower extremities.     Neurological: She is alert and oriented to person, place, and time. She has normal  strength. No sensory deficit. She exhibits normal muscle tone. Coordination and gait normal.  Reflex Scores:      Patellar reflexes are 2+ on the right side and 2+ on the left side.      Achilles reflexes are 2+ on the right side and 2+ on the left side. Skin: Skin is warm and dry. Capillary refill takes less than 2 seconds. No rash noted.  Nursing note and vitals reviewed.    ED Treatments / Results  Labs (all labs ordered are listed, but only abnormal results are displayed) Labs Reviewed - No data to display  EKG  EKG Interpretation None       Radiology No results found.   Procedures Procedures (including critical care time)  Medications Ordered in ED Medications  methocarbamol (ROBAXIN) tablet 500 mg (not administered)  albuterol (PROVENTIL HFA;VENTOLIN HFA) 108 (90 Base) MCG/ACT inhaler 2 puff (not administered)  HYDROcodone-acetaminophen (NORCO/VICODIN) 5-325 MG per tablet 1 tablet (not administered)     Initial Impression / Assessment and Plan / ED Course  I have reviewed the triage vital signs and the nursing notes.  Pertinent labs & imaging results that were available during my care of the patient were reviewed by me and considered in my medical decision making (see chart for details).     Patient reviewed on the [place] narcotics database, no prescriptions filed since December 2017   Low right sided back pain, likely sciatica.  No concerning sx's for emergent neurological process.  Non-productive cough.  No fever. Pt is non-toxic appearing.  Lung sounds improved after albuterol.  Return precautions discussed.   Final Clinical Impressions(s) / ED Diagnoses   Final diagnoses:  Sciatica of right side  Cough    New Prescriptions Discharge Medication List as of 08/04/2016  1:23 PM    START taking these medications   Details  methocarbamol (ROBAXIN) 500 MG tablet Take 1 tablet (500 mg total) by mouth 3 (three) times daily., Starting Fri 08/04/2016, Print      predniSONE (DELTASONE) 10 MG tablet Take 6 tablets day one, 5 tablets day two, 4 tablets day three, 3 tablets day four, 2 tablets day five, then 1 tablet day six, Print         Kem Parkinson, PA-C 08/07/16 2150    Nat Christen, MD 08/08/16 1226

## 2016-08-04 NOTE — Discharge Instructions (Signed)
Alternate ice and heat to your back.  Follow-up with our provider next week. Take 2 puffs of the inhaler 4 times a day as needed.

## 2016-10-24 ENCOUNTER — Emergency Department (HOSPITAL_COMMUNITY)
Admission: EM | Admit: 2016-10-24 | Discharge: 2016-10-24 | Disposition: A | Discharge status: Home / Self Care | Payer: Medicaid Other | Attending: Emergency Medicine | Admitting: Emergency Medicine

## 2016-10-24 ENCOUNTER — Encounter (HOSPITAL_COMMUNITY): Payer: Self-pay | Admitting: Emergency Medicine

## 2016-10-24 DIAGNOSIS — Y999 Unspecified external cause status: Secondary | ICD-10-CM | POA: Insufficient documentation

## 2016-10-24 DIAGNOSIS — F17210 Nicotine dependence, cigarettes, uncomplicated: Secondary | ICD-10-CM | POA: Diagnosis not present

## 2016-10-24 DIAGNOSIS — I10 Essential (primary) hypertension: Secondary | ICD-10-CM | POA: Insufficient documentation

## 2016-10-24 DIAGNOSIS — Y929 Unspecified place or not applicable: Secondary | ICD-10-CM | POA: Insufficient documentation

## 2016-10-24 DIAGNOSIS — J45909 Unspecified asthma, uncomplicated: Secondary | ICD-10-CM | POA: Insufficient documentation

## 2016-10-24 DIAGNOSIS — Z79899 Other long term (current) drug therapy: Secondary | ICD-10-CM | POA: Insufficient documentation

## 2016-10-24 DIAGNOSIS — Y939 Activity, unspecified: Secondary | ICD-10-CM | POA: Insufficient documentation

## 2016-10-24 DIAGNOSIS — W458XXA Other foreign body or object entering through skin, initial encounter: Secondary | ICD-10-CM | POA: Diagnosis not present

## 2016-10-24 DIAGNOSIS — T148XXA Other injury of unspecified body region, initial encounter: Secondary | ICD-10-CM

## 2016-10-24 DIAGNOSIS — S60457A Superficial foreign body of left little finger, initial encounter: Secondary | ICD-10-CM | POA: Diagnosis present

## 2016-10-24 MED ORDER — LIDOCAINE HCL (PF) 1 % IJ SOLN
5.0000 mL | Freq: Once | INTRAMUSCULAR | Status: AC
  Administered 2016-10-24: 14:00:00 via INTRADERMAL

## 2016-10-24 MED ORDER — LIDOCAINE HCL (PF) 1 % IJ SOLN
INTRAMUSCULAR | Status: AC
  Filled 2016-10-24: qty 5

## 2016-10-24 MED ORDER — LIDOCAINE-EPINEPHRINE-TETRACAINE (LET) SOLUTION
3.0000 mL | Freq: Once | NASAL | Status: AC
  Administered 2016-10-24: 3 mL via TOPICAL
  Filled 2016-10-24: qty 3

## 2016-10-24 NOTE — ED Triage Notes (Signed)
Pt has splinter in left pinkie finger.  Happened yesterday.

## 2016-10-24 NOTE — Discharge Instructions (Signed)
Keep it clean with mild soap and water.

## 2016-10-24 NOTE — ED Provider Notes (Signed)
[facility] DEPT Provider Note   CSN: [id number] Arrival date & time: 10/24/16  1106     History   Chief Complaint Chief Complaint  Patient presents with  . Foreign Body in Skin    HPI Kathryn [name] is a 46 y.o. female.  HPI   Kathryn [name] is a 46 y.o. female who presents to the Emergency Department complaining of wooden splinter to her left fifth finger.  She states she noticed the splinter yesterday.  She has tried to remove it with tweezers, and fingernail clippers without success.  She complains of severe pain when her finger is touched or bent.  She denies drainage, fever or redness.    Past Medical History:  Diagnosis Date  . Asthma   . Hypertension     There are no active problems to display for this patient.   Past Surgical History:  Procedure Laterality Date  . TUBAL LIGATION      OB History    Gravida Para Term Preterm AB Living   5 3 2 1 2 3    SAB TAB Ectopic Multiple Live Births   1 1             Home Medications    Prior to Admission medications   Medication Sig Start Date End Date Taking? Authorizing Provider  acetaminophen (TYLENOL) 500 MG tablet Take 1,000 mg by mouth every 6 (six) hours as needed for moderate pain.    Yes [provider]  albuterol (PROVENTIL HFA;VENTOLIN HFA) 108 (90 Base) MCG/ACT inhaler Inhale 1-2 puffs into the lungs every 6 (six) hours as needed for wheezing or shortness of breath. 04/23/16  Yes Idol, Almyra Free, PA-C  hydrochlorothiazide (MICROZIDE) 12.5 MG capsule Take 12.5 mg by mouth daily.   Yes [provider]  methocarbamol (ROBAXIN) 500 MG tablet Take 1 tablet (500 mg total) by mouth 3 (three) times daily. 08/04/16  Yes Asley Baskerville, PA-C    Family History History reviewed. No pertinent family history.  Social History Social History  Substance Use Topics  . Smoking status: Former Smoker    Types: Cigarettes  . Smokeless tobacco: Never Used  . Alcohol use Yes     Comment: occ      Allergies   Clonopin [clonazepam]; Fish allergy; Flexeril [cyclobenzaprine hcl]; Ibuprofen; and Tramadol   Review of Systems Review of Systems  Constitutional: Negative for activity change, chills and fever.  Musculoskeletal: Negative for neck pain and neck stiffness.  Skin: Positive for wound. Negative for rash.       Foreign body skin of left fifth finger  Neurological: Negative for weakness and numbness.  All other systems reviewed and are negative.    Physical Exam Updated Vital Signs BP (!) 127/91 (BP Location: Right Arm)   Pulse 73   Temp 98.4 F (36.9 C) (Oral)   Resp 18   Ht 4\' 11"  (1.499 m)   Wt 61.2 kg (135 lb)   LMP 10/17/2016   SpO2 100%   BMI 27.27 kg/m   Physical Exam  Constitutional: She is oriented to person, place, and time. She appears well-developed and well-nourished. No distress.  Cardiovascular: Normal rate and intact distal pulses.   Pulmonary/Chest: Effort normal. No respiratory distress.  Musculoskeletal: Normal range of motion.  Neurological: She is alert and oriented to person, place, and time. No sensory deficit.  Skin: Skin is warm. Capillary refill takes less than 2 seconds.  Wooden splinter to the volar surface of the mid left fifth finger.  No surrounding erythema or edema  Nursing note and vitals reviewed.    ED Treatments / Results  Labs (all labs ordered are listed, but only abnormal results are displayed) Labs Reviewed - No data to display  EKG  EKG Interpretation None       Radiology No results found.  Procedures .Foreign Body Removal Date/Time: 10/24/2016 1:20 PM Performed by: Kem Parkinson Authorized by: Kem Parkinson  Consent: Verbal consent obtained. Risks and benefits: risks, benefits and alternatives were discussed Consent given by: patient Patient understanding: patient states understanding of the procedure being performed Patient identity confirmed: verbally with patient Body area: skin General  location: upper extremity Location details: left small finger Anesthesia: local infiltration (topical)  Anesthesia: Local Anesthetic: LET (lido,epi,tetracaine) and lidocaine 1% without epinephrine Anesthetic total: 5 mL  Sedation: Patient sedated: no Patient restrained: no Patient cooperative: no Localization method: visualized Removal mechanism: forceps Dressing: dressing applied Tendon involvement: none Depth: subcutaneous Complexity: simple 1 objects recovered. Objects recovered: wood Post-procedure assessment: foreign body removed Patient tolerance: Patient tolerated the procedure well with no immediate complications   (including critical care time)  Medications Ordered in ED Medications  lidocaine-EPINEPHrine-tetracaine (LET) solution (not administered)  lidocaine (PF) (XYLOCAINE) 1 % injection 5 mL (not administered)  lidocaine (PF) (XYLOCAINE) 1 % injection (not administered)     Initial Impression / Assessment and Plan / ED Course  I have reviewed the triage vital signs and the nursing notes.  Pertinent labs & imaging results that were available during my care of the patient were reviewed by me and considered in my medical decision making (see chart for details).     Finger bandaged.  NV intact.  No active bleeding   Final Clinical Impressions(s) / ED Diagnoses   Final diagnoses:  Foreign body/splinter, skin    New Prescriptions New Prescriptions   No medications on file     Kem Parkinson, Hershal Coria 10/24/16 1336    Milton Ferguson, MD 10/24/16 1432

## 2016-12-05 ENCOUNTER — Encounter (HOSPITAL_COMMUNITY): Payer: Self-pay

## 2016-12-05 ENCOUNTER — Emergency Department (HOSPITAL_COMMUNITY): Payer: Medicaid Other

## 2016-12-05 ENCOUNTER — Emergency Department (HOSPITAL_COMMUNITY)
Admission: EM | Admit: 2016-12-05 | Discharge: 2016-12-05 | Disposition: A | Discharge status: Home / Self Care | Payer: Medicaid Other | Attending: Emergency Medicine | Admitting: Emergency Medicine

## 2016-12-05 DIAGNOSIS — J45909 Unspecified asthma, uncomplicated: Secondary | ICD-10-CM | POA: Insufficient documentation

## 2016-12-05 DIAGNOSIS — R079 Chest pain, unspecified: Secondary | ICD-10-CM | POA: Diagnosis present

## 2016-12-05 DIAGNOSIS — I10 Essential (primary) hypertension: Secondary | ICD-10-CM | POA: Insufficient documentation

## 2016-12-05 DIAGNOSIS — R0789 Other chest pain: Secondary | ICD-10-CM | POA: Insufficient documentation

## 2016-12-05 DIAGNOSIS — Z87891 Personal history of nicotine dependence: Secondary | ICD-10-CM | POA: Insufficient documentation

## 2016-12-05 DIAGNOSIS — Z79899 Other long term (current) drug therapy: Secondary | ICD-10-CM | POA: Insufficient documentation

## 2016-12-05 MED ORDER — HYDROMORPHONE HCL 1 MG/ML IJ SOLN
1.0000 mg | Freq: Once | INTRAMUSCULAR | Status: AC
  Administered 2016-12-05: 1 mg via INTRAMUSCULAR
  Filled 2016-12-05: qty 1

## 2016-12-05 MED ORDER — ONDANSETRON 4 MG PO TBDP
4.0000 mg | ORAL_TABLET | Freq: Once | ORAL | Status: AC
  Administered 2016-12-05: 4 mg via ORAL
  Filled 2016-12-05: qty 1

## 2016-12-05 MED ORDER — HYDROCODONE-ACETAMINOPHEN 5-325 MG PO TABS: 1.0000 | ORAL_TABLET | Freq: Four times a day (QID) | ORAL | 0 refills | Status: DC | PRN

## 2016-12-05 NOTE — Discharge Instructions (Signed)
Follow up with your provider if not improving

## 2016-12-05 NOTE — ED Provider Notes (Signed)
[place] [facility] DEPT Provider Note   CSN: [id number] Arrival date & time: 12/05/16  8315     History   Chief Complaint Chief Complaint  Patient presents with  . Rib pain    HPI Kathryn [name] is a 46 y.o. female.  Patient complains of left-sided chest pain after lifting a cinder block   The history is provided by the patient. No language interpreter was used.  Chest Pain   This is a new problem. The problem occurs constantly. The problem has not changed since onset.The pain is associated with movement. The pain is present in the lateral region. The pain is at a severity of 5/10. The pain is moderate. The quality of the pain is described as dull. The pain does not radiate. Exacerbated by: movement. Pertinent negatives include no abdominal pain, no back pain, no cough and no headaches. She has tried nothing for the symptoms. The treatment provided no relief. There are no known risk factors.  Pertinent negatives for past medical history include no aneurysm and no seizures.    Past Medical History:  Diagnosis Date  . Asthma   . Hypertension     There are no active problems to display for this patient.   Past Surgical History:  Procedure Laterality Date  . TUBAL LIGATION      OB History    Gravida Para Term Preterm AB Living   5 3 2 1 2 3    SAB TAB Ectopic Multiple Live Births   1 1             Home Medications    Prior to Admission medications   Medication Sig Start Date End Date Taking? Authorizing Provider  acetaminophen (TYLENOL) 500 MG tablet Take 1,000 mg by mouth every 6 (six) hours as needed for moderate pain.     [provider]  albuterol (PROVENTIL HFA;VENTOLIN HFA) 108 (90 Base) MCG/ACT inhaler Inhale 1-2 puffs into the lungs every 6 (six) hours as needed for wheezing or shortness of breath. 04/23/16   Evalee Jefferson, PA-C  hydrochlorothiazide (MICROZIDE) 12.5 MG capsule Take 12.5 mg by mouth daily.    [provider]    HYDROcodone-acetaminophen (NORCO/VICODIN) 5-325 MG tablet Take 1 tablet by mouth every 6 (six) hours as needed for moderate pain. 12/05/16   Milton Ferguson, MD  methocarbamol (ROBAXIN) 500 MG tablet Take 1 tablet (500 mg total) by mouth 3 (three) times daily. 08/04/16   Triplett, Lynelle Smoke, PA-C    Family History No family history on file.  Social History Social History  Substance Use Topics  . Smoking status: Former Smoker    Types: Cigarettes  . Smokeless tobacco: Never Used  . Alcohol use Yes     Comment: occ     Allergies   Clonopin [clonazepam]; Fish allergy; Flexeril [cyclobenzaprine hcl]; Ibuprofen; and Tramadol   Review of Systems Review of Systems  Constitutional: Negative for appetite change and fatigue.  HENT: Negative for congestion, ear discharge and sinus pressure.   Eyes: Negative for discharge.  Respiratory: Negative for cough.   Cardiovascular: Positive for chest pain.  Gastrointestinal: Negative for abdominal pain and diarrhea.  Genitourinary: Negative for frequency and hematuria.  Musculoskeletal: Negative for back pain.  Skin: Negative for rash.  Neurological: Negative for seizures and headaches.  Psychiatric/Behavioral: Negative for hallucinations.     Physical Exam Updated Vital Signs BP 120/84 (BP Location: Left Arm)   Pulse 73   Temp 98.9 F (37.2 C) (Oral)   Resp 20  Ht 4\' 11"  (1.499 m)   Wt 61.2 kg (135 lb)   LMP 11/29/2016   SpO2 100%   BMI 27.27 kg/m   Physical Exam  Constitutional: She is oriented to person, place, and time. She appears well-developed.  HENT:  Head: Normocephalic.  Eyes: Conjunctivae and EOM are normal. No scleral icterus.  Neck: Neck supple. No thyromegaly present.  Cardiovascular: Normal rate and regular rhythm.  Exam reveals no gallop and no friction rub.   No murmur heard. Pulmonary/Chest: No stridor. She has no wheezes. She has no rales. She exhibits tenderness.  Tender left chest  Abdominal: She exhibits no  distension. There is no tenderness. There is no rebound.  Musculoskeletal: Normal range of motion. She exhibits no edema.  Lymphadenopathy:    She has no cervical adenopathy.  Neurological: She is oriented to person, place, and time. She exhibits normal muscle tone. Coordination normal.  Skin: No rash noted. No erythema.  Psychiatric: She has a normal mood and affect. Her behavior is normal.     ED Treatments / Results  Labs (all labs ordered are listed, but only abnormal results are displayed) Labs Reviewed - No data to display  EKG  EKG Interpretation None       Radiology Dg Ribs Unilateral W/chest Left  Result Date: 12/05/2016 CLINICAL DATA:  She lifted some cinder blocks yesterday and woke up around 5 this morning with severe pain lt side of chest and back. She said that it hurts to move or breathe. EXAM: LEFT RIBS AND CHEST - 3+ VIEW COMPARISON:  Radiograph 04/23/2016 FINDINGS: Normal cardiac silhouette. Lungs are clear. No pneumothorax or pulmonary contusion. No evidence of fracture. Dedicated views of the LEFT ribs demonstrate no displaced fracture. IMPRESSION: 1. No LEFT rib fracture pneumothorax 2. Normal mediastinum and cardiac silhouette. Normal pulmonary vasculature. No evidence of effusion, infiltrate, or pneumothorax. No acute bony abnormality. Electronically Signed   By: Suzy Bouchard M.D.   On: 12/05/2016 09:21    Procedures Procedures (including critical care time)  Medications Ordered in ED Medications  HYDROmorphone (DILAUDID) injection 1 mg (1 mg Intramuscular Given 12/05/16 0843)  ondansetron (ZOFRAN-ODT) disintegrating tablet 4 mg (4 mg Oral Given 12/05/16 0843)  HYDROmorphone (DILAUDID) injection 1 mg (1 mg Intramuscular Given 12/05/16 1014)     Initial Impression / Assessment and Plan / ED Course  I have reviewed the triage vital signs and the nursing notes.  Pertinent labs & imaging results that were available during my care of the patient were  reviewed by me and considered in my medical decision making (see chart for details).     Patient with musculoskeletal pain to left chest. She will be placed on some hydrocodone to help with the discomfort and follow-up with her PCP  Final Clinical Impressions(s) / ED Diagnoses   Final diagnoses:  Chest wall pain    New Prescriptions New Prescriptions   HYDROCODONE-ACETAMINOPHEN (NORCO/VICODIN) 5-325 MG TABLET    Take 1 tablet by mouth every 6 (six) hours as needed for moderate pain.     Milton Ferguson, MD 12/05/16 1040

## 2016-12-05 NOTE — ED Triage Notes (Signed)
Pt says she lifted some cinderblocks yesterday and today is having pain in left side.  Hurts worse to breathe and move.

## 2017-01-03 NOTE — Progress Notes (Signed)
Patient ID: Kathryn [name], female    DOB: 1970-09-08, 46 y.o.   MRN: [id number]  Chief Complaint  Patient presents with  . Hypertension  . Peripheral Neuropathy  . Headache    Allergies Clonopin [clonazepam]; Fish allergy; Flexeril [cyclobenzaprine hcl]; Ibuprofen; and Tramadol  Subjective:   Kathryn [name] is a 46 y.o. female who presents to [facility] today.  HPI Presents with the complaint of the "worst headache of life" that has been there since Monday/three days. Came on all of a sudden, sitting there watching tv and felt like someone was " splitting her brain". Pain is a 10 out of a 10. Eases to an 8 with lying down. Has taken some tylenol and it does not help at all. When stands the pain is more severe. If tries to walk feels dizzy and vision is blurry. Has fallen three times over the past three days b/c tries to walk and is off balance. Reports that she has not hit her head. Has never had headaches in the past. Has never had pain like this in her life. Nothing makes it better or worse. Has not had vomiting. Brought in today by her husband/partner.   Headache   This is a new problem. Episode onset: the past three days. The problem occurs constantly. The problem has been unchanged. The pain is located in the left unilateral and retro-orbital region. The pain does not radiate. The pain quality is not similar to prior headaches. The quality of the pain is described as sharp and stabbing. The pain is at a severity of 10/10. The pain is severe. Associated symptoms include blurred vision, a loss of balance and a visual change. Pertinent negatives include no abdominal pain, abnormal behavior, anorexia, coughing, dizziness, ear pain, eye pain, eye redness, eye watering, fever, hearing loss, muscle aches, nausea, neck pain, numbness, phonophobia, photophobia, scalp tenderness, seizures, sinus pressure, sore throat, tingling, tinnitus, vomiting, weakness or weight loss. Nothing  aggravates the symptoms. She has tried acetaminophen and NSAIDs for the symptoms. The treatment provided no relief. Her past medical history is significant for hypertension. There is no history of migraine headaches, migraines in the family or recent head traumas.    Past Medical History:  Diagnosis Date  . Asthma   . Hypertension     Past Surgical History:  Procedure Laterality Date  . TUBAL LIGATION      Family History  Problem Relation Age of Onset  . Migraines Neg Hx      Social History   Social History  . Marital status: Single    Spouse name: N/A  . Number of children: N/A  . Years of education: N/A   Social History Main Topics  . Smoking status: Former Smoker    Types: Cigarettes  . Smokeless tobacco: Never Used  . Alcohol use Yes     Comment: occ  . Drug use: No  . Sexual activity: Yes   Other Topics Concern  . None   Social History Narrative  . None    Review of Systems  Constitutional: Negative for activity change, appetite change, fever and weight loss.  HENT: Negative for ear pain, hearing loss, sinus pressure, sore throat and tinnitus.   Eyes: Positive for blurred vision. Negative for photophobia, pain, redness and visual disturbance.  Respiratory: Negative for cough, chest tightness and shortness of breath.   Cardiovascular: Negative for chest pain, palpitations and leg swelling.  Gastrointestinal: Negative for abdominal pain, anorexia, nausea and  vomiting.  Genitourinary: Negative for dysuria, frequency and urgency.  Musculoskeletal: Negative for neck pain.  Neurological: Positive for headaches and loss of balance. Negative for dizziness, tingling, seizures, syncope, weakness, light-headedness and numbness.  Hematological: Negative for adenopathy.     Objective:   BP 132/78 (BP Location: Left Arm, Patient Position: Sitting, Cuff Size: Normal)   Pulse 84   Temp 98.9 F (37.2 C) (Other (Comment))   Resp 16   Ht 4\' 11"  (1.499 m)   Wt 169 lb  (76.7 kg)   SpO2 98%   BMI 34.13 kg/m   Physical Exam  Constitutional: She is oriented to person, place, and time. She appears well-developed and well-nourished. She does not have a sickly appearance. She appears ill. No distress.  Patient lying on exam table holding her head with eyes covered up when I walked into the room. Patient appeared to be in pain. Somewhat unsteady on feet when walked but appeared to have arthritic condition in legs.   HENT:  Head: Normocephalic and atraumatic.  Eyes: Pupils are equal, round, and reactive to light. EOM are normal. Right eye exhibits no discharge. Left eye exhibits no discharge.  Neck: Normal range of motion. Neck supple. No tracheal deviation present. No thyromegaly present.  Cardiovascular: Normal rate, regular rhythm and normal heart sounds.   Pulmonary/Chest: Effort normal and breath sounds normal. No respiratory distress.  Neurological: She is alert and oriented to person, place, and time. She has normal strength. She displays no atrophy and no tremor. No cranial nerve deficit or sensory deficit. GCS eye subscore is 4. GCS verbal subscore is 5. GCS motor subscore is 6.  Strength 5/5 in UE/LE No facial deviation. Speech normal Sensation intact in upper extremity No tongue deviation.   Skin: Skin is warm and dry.  Psychiatric: Her behavior is normal. Thought content normal. Her mood appears anxious. Her affect is labile. Her speech is not slurred. Cognition and memory are normal.  Nursing note and vitals reviewed.    Assessment and Plan   1. Need for immunization against influenza - Flu Vaccine QUAD 36+ mos IM   2. Sudden onset of severe headache Patient symptoms worrisome for intracranial pathology. Needs head CT imaging.  Recommended patient go to ED for evaluation of headache. Patient alert, responsive, and competent. Patient defers EMS transport to the ED across the street but prefers to drive. Patient counseled on risks and possible  etiologies of headache. I called ED and spoke with Dr. Lacinda Axon and informed that patient was on her way and gave him her history.  Patient agreeable with treatment plan and reports that she needs to get some help for this.   3. Hypertension goal BP (blood pressure) < 140/90 BP controlled today but history of HTN.  Patient to follow up in our office pending emergent evaluation.     No Follow-up on file. Caren Macadam, MD 01/11/2017

## 2017-01-11 ENCOUNTER — Encounter: Payer: Self-pay | Admitting: Family Medicine

## 2017-01-11 ENCOUNTER — Encounter (HOSPITAL_COMMUNITY): Payer: Self-pay | Admitting: Emergency Medicine

## 2017-01-11 ENCOUNTER — Emergency Department (HOSPITAL_COMMUNITY): Payer: Medicaid Other

## 2017-01-11 ENCOUNTER — Ambulatory Visit (INDEPENDENT_AMBULATORY_CARE_PROVIDER_SITE_OTHER): Payer: Medicaid Other | Admitting: Family Medicine

## 2017-01-11 ENCOUNTER — Emergency Department (HOSPITAL_COMMUNITY)
Admission: EM | Admit: 2017-01-11 | Discharge: 2017-01-11 | Disposition: A | Discharge status: Home / Self Care | Payer: Medicaid Other | Attending: Emergency Medicine | Admitting: Emergency Medicine

## 2017-01-11 VITALS — BP 132/78 | HR 84 | Temp 98.9°F | Resp 16 | Ht 59.0 in | Wt 169.0 lb

## 2017-01-11 DIAGNOSIS — R42 Dizziness and giddiness: Secondary | ICD-10-CM | POA: Insufficient documentation

## 2017-01-11 DIAGNOSIS — I10 Essential (primary) hypertension: Secondary | ICD-10-CM | POA: Diagnosis not present

## 2017-01-11 DIAGNOSIS — Z87891 Personal history of nicotine dependence: Secondary | ICD-10-CM | POA: Insufficient documentation

## 2017-01-11 DIAGNOSIS — R51 Headache: Secondary | ICD-10-CM

## 2017-01-11 DIAGNOSIS — J45909 Unspecified asthma, uncomplicated: Secondary | ICD-10-CM | POA: Diagnosis not present

## 2017-01-11 DIAGNOSIS — Z23 Encounter for immunization: Secondary | ICD-10-CM

## 2017-01-11 DIAGNOSIS — R110 Nausea: Secondary | ICD-10-CM | POA: Insufficient documentation

## 2017-01-11 DIAGNOSIS — R519 Headache, unspecified: Secondary | ICD-10-CM

## 2017-01-11 LAB — I-STAT CHEM 8, ED
BUN: 18 mg/dL (ref 6–20)
CALCIUM ION: 1.18 mmol/L (ref 1.15–1.40)
CHLORIDE: 106 mmol/L (ref 101–111)
CREATININE: 0.8 mg/dL (ref 0.44–1.00)
GLUCOSE: 87 mg/dL (ref 65–99)
HCT: 36 % (ref 36.0–46.0)
Hemoglobin: 12.2 g/dL (ref 12.0–15.0)
Potassium: 4.4 mmol/L (ref 3.5–5.1)
SODIUM: 141 mmol/L (ref 135–145)
TCO2: 24 mmol/L (ref 22–32)

## 2017-01-11 LAB — I-STAT BETA HCG BLOOD, ED (MC, WL, AP ONLY): I-stat hCG, quantitative: <5 m[IU]/mL (ref <5)

## 2017-01-11 MED ORDER — PROCHLORPERAZINE MALEATE 10 MG PO TABS
10.0000 mg | ORAL_TABLET | Freq: Two times a day (BID) | ORAL | 0 refills | Status: DC | PRN
Start: 2017-01-11 — End: 2017-04-20

## 2017-01-11 MED ORDER — SODIUM CHLORIDE 0.9 % IV BOLUS (SEPSIS)
1000.0000 mL | Freq: Once | INTRAVENOUS | Status: AC
  Administered 2017-01-11: 1000 mL via INTRAVENOUS

## 2017-01-11 MED ORDER — PROCHLORPERAZINE EDISYLATE 5 MG/ML IJ SOLN
10.0000 mg | Freq: Once | INTRAMUSCULAR | Status: AC
  Administered 2017-01-11: 10 mg via INTRAVENOUS
  Filled 2017-01-11: qty 2

## 2017-01-11 MED ORDER — DEXAMETHASONE SODIUM PHOSPHATE 4 MG/ML IJ SOLN
10.0000 mg | Freq: Once | INTRAMUSCULAR | Status: AC
  Administered 2017-01-11: 10 mg via INTRAVENOUS
  Filled 2017-01-11: qty 3

## 2017-01-11 MED ORDER — DIPHENHYDRAMINE HCL 50 MG/ML IJ SOLN
25.0000 mg | Freq: Once | INTRAMUSCULAR | Status: AC
  Administered 2017-01-11: 25 mg via INTRAVENOUS
  Filled 2017-01-11: qty 1

## 2017-01-11 NOTE — ED Triage Notes (Signed)
Headache started Tuesday afternoon after eating a sub for dinner. Pt says she has neuropathy, numbness and tingling in her lower extremities. Pain worsens with standing, noise and light. Pt denies unilateral weakness and no speech changes.

## 2017-01-11 NOTE — ED Provider Notes (Signed)
[place] DEPT Provider Note   CSN: [id number] Arrival date & time: 01/11/17  1019     History   Chief Complaint Chief Complaint  Patient presents with  . Headache    HPI Kathryn [name] is a 46 y.o. female.  The history is provided by the patient.  Headache   This is a new problem. The current episode started 2 days ago. The problem occurs constantly. The problem has not changed since onset.The headache is associated with bright light and activity. The pain is located in the left unilateral region. The quality of the pain is described as throbbing. The pain is at a severity of 8/10. The pain radiates to the left neck. Associated symptoms include nausea. Pertinent negatives include no fever, no malaise/fatigue, no chest pressure, no near-syncope, no syncope, no shortness of breath and no vomiting. She has tried acetaminophen for the symptoms. The treatment provided no relief.   46 year old female who presents with headache. No significant headache or migraine history. History of asthma and HTN. Reports onset of constant waxing waning in severity left sided headache starting 2 days ago. Worse when she gets up and ambulates, improved when she is lying down. Associated with nausea, but no vomiting. Also endorses lightheadedness but no syncope. Denies vision or speech changes, focal numbness or weakness, fever or recent infection. Took 650 mg tylenol multiple times over past 2 days without relief.   Past Medical History:  Diagnosis Date  . Asthma   . Hypertension     Patient Active Problem List   Diagnosis Date Noted  . Hypertension goal BP (blood pressure) < 140/90 01/11/2017    Past Surgical History:  Procedure Laterality Date  . TUBAL LIGATION      OB History    Gravida Para Term Preterm AB Living   5 3 2 1 2 3    SAB TAB Ectopic Multiple Live Births   1 1             Home Medications    Prior to Admission medications   Medication Sig Start Date End Date Taking?  Authorizing Provider  acetaminophen (TYLENOL) 500 MG tablet Take 1,000 mg by mouth every 6 (six) hours as needed for moderate pain.    Yes [provider]  albuterol (PROVENTIL HFA;VENTOLIN HFA) 108 (90 Base) MCG/ACT inhaler Inhale 1-2 puffs into the lungs every 6 (six) hours as needed for wheezing or shortness of breath. 04/23/16  Yes Idol, Almyra Free, PA-C  aspirin-acetaminophen-caffeine (EXCEDRIN MIGRAINE) [phone] MG tablet Take 1 tablet by mouth every 6 (six) hours as needed for headache.   Yes [provider]  HYDROcodone-acetaminophen (NORCO/VICODIN) 5-325 MG tablet Take 1 tablet by mouth every 6 (six) hours as needed for moderate pain. Patient not taking: Reported on 01/11/2017 12/05/16   Milton Ferguson, MD  methocarbamol (ROBAXIN) 500 MG tablet Take 1 tablet (500 mg total) by mouth 3 (three) times daily. Patient not taking: Reported on 01/11/2017 08/04/16   Kem Parkinson, PA-C    Family History Family History  Problem Relation Age of Onset  . Migraines Neg Hx     Social History Social History  Substance Use Topics  . Smoking status: Former Smoker    Types: Cigarettes  . Smokeless tobacco: Never Used  . Alcohol use Yes     Comment: occ     Allergies   Clonopin [clonazepam]; Fish allergy; Flexeril [cyclobenzaprine hcl]; Ibuprofen; and Tramadol   Review of Systems Review of Systems  Constitutional: Negative  for fever and malaise/fatigue.  Respiratory: Negative for shortness of breath.   Cardiovascular: Negative for syncope and near-syncope.  Gastrointestinal: Positive for nausea. Negative for vomiting.  Neurological: Positive for headaches.  All other systems reviewed and are negative.    Physical Exam Updated Vital Signs BP 120/66   Pulse 63   Temp 98.1 F (36.7 C) (Oral)   Resp 18   Ht 4\' 11"  (1.499 m)   Wt 76.7 kg (169 lb)   SpO2 99%   BMI 34.13 kg/m   Physical Exam Physical Exam  Nursing note and vitals reviewed. Constitutional: Well  developed, well nourished, non-toxic, and in no acute distress Head: Normocephalic and atraumatic.  Mouth/Throat: Oropharynx is clear and moist.  Neck: Normal range of motion. Neck supple. No nuchal rigidity Cardiovascular: Normal rate and regular rhythm.   Pulmonary/Chest: Effort normal and breath sounds normal.  Abdominal: Soft. There is no tenderness. There is no rebound and no guarding.  Musculoskeletal: Normal range of motion.  Skin: Skin is warm and dry.  Psychiatric: Cooperative Neurological:  Alert, oriented to person, place, time, and situation. Memory grossly in tact. Fluent speech. No dysarthria or aphasia.  Cranial nerves: VF are full. EOMI without nystagmus. No gaze deviation. Facial muscles symmetric with activation. Sensation to light touch over face in tact bilaterally. Hearing grossly in tact. Palate elevates symmetrically. Head turn and shoulder shrug are intact. Tongue midline.  Reflexes defered.  Muscle bulk and tone normal. No pronator drift. Moves all extremities symmetrically. Sensation to light touch is in tact throughout in bilateral upper and lower extremities. Coordination reveals no dysmetria with finger to nose.    ED Treatments / Results  Labs (all labs ordered are listed, but only abnormal results are displayed) Labs Reviewed  I-STAT BETA HCG BLOOD, ED ([facility], [facility], [facility] ONLY)  I-STAT CHEM 8, ED    EKG  EKG Interpretation None       Radiology Ct Head Wo Contrast  Result Date: 01/11/2017 CLINICAL DATA:  Acute severe headache, worst headache of life, headache since Tuesday after dinner, neuropathy, numbness and tingling in lower extremities, pain worsens with standing on delays, and light, history hypertension, asthma EXAM: CT HEAD WITHOUT CONTRAST TECHNIQUE: Contiguous axial images were obtained from the base of the skull through the vertex without intravenous contrast. Sagittal and coronal MPR images reconstructed from axial data set. COMPARISON:   11/20/2011 FINDINGS: Brain: Normal ventricular morphology. No midline shift or mass effect. Normal appearance of brain parenchyma. No intracranial hemorrhage, mass lesion or evidence of acute infarction. No extra-axial fluid collections. Vascular: Normal appearance Skull: Normal appearance Sinuses/Orbits: Clear Other: N/A IMPRESSION: Normal CT head. Electronically Signed   By: Lavonia  M.D.   On: 01/11/2017 12:58    Procedures Procedures (including critical care time)  Medications Ordered in ED Medications  sodium chloride 0.9 % bolus 1,000 mL (1,000 mLs Intravenous New Bag/Given 01/11/17 1102)  prochlorperazine (COMPAZINE) injection 10 mg (10 mg Intravenous Given 01/11/17 1102)  dexamethasone (DECADRON) injection 10 mg (10 mg Intravenous Given 01/11/17 1102)  diphenhydrAMINE (BENADRYL) injection 25 mg (25 mg Intravenous Given 01/11/17 1102)     Initial Impression / Assessment and Plan / ED Course  I have reviewed the triage vital signs and the nursing notes.  Pertinent labs & imaging results that were available during my care of the patient were reviewed by me and considered in my medical decision making (see chart for details).    Presents with unilateral headache, nausea, photophobia. Seems c/w  migraine headache, but she has no previous headache history. She has a normal neurological exam.she is afebrile and hemodynamically stable. No concerning signs or symptoms of acute infectious intracranial process. Given that she has no prior history of headaches with very atypical headache, CT head was performed. Shows no acute intercranial processes such has bleeding or space occupying lesion. History not suggestive of SAH. She did receive migraine cocktail, with significant improvement in her pain. She feels comfortable managing symptoms from home. Strict return and follow-up instructions reviewed. She expressed understanding of all discharge instructions and felt comfortable with the plan of  care.   Final Clinical Impressions(s) / ED Diagnoses   Final diagnoses:  Unilateral headache    New Prescriptions New Prescriptions   No medications on file     Forde Dandy, MD 01/11/17 1318

## 2017-01-11 NOTE — Discharge Instructions (Signed)
Your CT head is normal.  Continue tylenol as needed for headache. Get plenty of sleep and keep hydrated. Return without fail for worsening symptoms, including confusion, fever, escalating pain or any other symptoms concerning to you.

## 2017-01-25 ENCOUNTER — Ambulatory Visit: Payer: Medicaid Other | Admitting: Family Medicine

## 2017-02-01 ENCOUNTER — Ambulatory Visit: Payer: Medicaid Other | Admitting: Family Medicine

## 2017-04-20 ENCOUNTER — Other Ambulatory Visit: Payer: Self-pay

## 2017-04-20 ENCOUNTER — Ambulatory Visit: Payer: Medicaid Other | Admitting: Family Medicine

## 2017-04-20 ENCOUNTER — Encounter: Payer: Self-pay | Admitting: Family Medicine

## 2017-04-20 VITALS — BP 165/88 | HR 67 | Temp 98.7°F | Resp 16 | Ht 59.0 in | Wt 168.4 lb

## 2017-04-20 DIAGNOSIS — G8929 Other chronic pain: Secondary | ICD-10-CM | POA: Diagnosis not present

## 2017-04-20 DIAGNOSIS — R5383 Other fatigue: Secondary | ICD-10-CM | POA: Diagnosis not present

## 2017-04-20 DIAGNOSIS — M5442 Lumbago with sciatica, left side: Secondary | ICD-10-CM | POA: Diagnosis not present

## 2017-04-20 DIAGNOSIS — F320 Major depressive disorder, single episode, mild: Secondary | ICD-10-CM

## 2017-04-20 DIAGNOSIS — I10 Essential (primary) hypertension: Secondary | ICD-10-CM | POA: Diagnosis not present

## 2017-04-20 DIAGNOSIS — M5441 Lumbago with sciatica, right side: Secondary | ICD-10-CM

## 2017-04-20 DIAGNOSIS — Z113 Encounter for screening for infections with a predominantly sexual mode of transmission: Secondary | ICD-10-CM

## 2017-04-20 MED ORDER — DULOXETINE HCL 30 MG PO CPEP: 30.0000 mg | ORAL_CAPSULE | Freq: Every day | ORAL | 0 refills | Status: DC

## 2017-04-20 MED ORDER — LISINOPRIL-HYDROCHLOROTHIAZIDE 20-25 MG PO TABS
1.0000 | ORAL_TABLET | Freq: Every day | ORAL | 3 refills | Status: DC
Start: 2017-04-20 — End: 2017-05-11
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
## 2017-04-20 NOTE — Patient Instructions (Signed)
DASH Eating Plan DASH stands for "Dietary Approaches to Stop Hypertension." The DASH eating plan is a healthy eating plan that has been shown to reduce high blood pressure (hypertension). It may also reduce your risk for type 2 diabetes, heart disease, and stroke. The DASH eating plan may also help with weight loss. What are tips for following this plan? General guidelines  Avoid eating more than 2,300 mg (milligrams) of salt (sodium) a day. If you have hypertension, you may need to reduce your sodium intake to 1,500 mg a day.  Limit alcohol intake to no more than 1 drink a day for nonpregnant women and 2 drinks a day for men. One drink equals 12 oz of beer, 5 oz of wine, or 1 oz of hard liquor.  Work with your health care provider to maintain a healthy body weight or to lose weight. Ask what an ideal weight is for you.  Get at least 30 minutes of exercise that causes your heart to beat faster (aerobic exercise) most days of the week. Activities may include walking, swimming, or biking.  Work with your health care provider or diet and nutrition specialist (dietitian) to adjust your eating plan to your individual calorie needs. Reading food labels  Check food labels for the amount of sodium per serving. Choose foods with less than 5 percent of the Daily Value of sodium. Generally, foods with less than 300 mg of sodium per serving fit into this eating plan.  To find whole grains, look for the word "whole" as the first word in the ingredient list. Shopping  Buy products labeled as "low-sodium" or "no salt added."  Buy fresh foods. Avoid canned foods and premade or frozen meals. Cooking  Avoid adding salt when cooking. Use salt-free seasonings or herbs instead of table salt or sea salt. Check with your health care provider or pharmacist before using salt substitutes.  Do not fry foods. Cook foods using healthy methods such as baking, boiling, grilling, and broiling instead.  Cook with  heart-healthy oils, such as olive, canola, soybean, or sunflower oil. Meal planning   Eat a balanced diet that includes: ? 5 or more servings of fruits and vegetables each day. At each meal, try to fill half of your plate with fruits and vegetables. ? Up to 6-8 servings of whole grains each day. ? Less than 6 oz of lean meat, poultry, or fish each day. A 3-oz serving of meat is about the same size as a deck of cards. One egg equals 1 oz. ? 2 servings of low-fat dairy each day. ? A serving of nuts, seeds, or beans 5 times each week. ? Heart-healthy fats. Healthy fats called Omega-3 fatty acids are found in foods such as flaxseeds and coldwater fish, like sardines, salmon, and mackerel.  Limit how much you eat of the following: ? Canned or prepackaged foods. ? Food that is high in trans fat, such as fried foods. ? Food that is high in saturated fat, such as fatty meat. ? Sweets, desserts, sugary drinks, and other foods with added sugar. ? Full-fat dairy products.  Do not salt foods before eating.  Try to eat at least 2 vegetarian meals each week.  Eat more home-cooked food and less restaurant, buffet, and fast food.  When eating at a restaurant, ask that your food be prepared with less salt or no salt, if possible. What foods are recommended? The items listed may not be a complete list. Talk with your dietitian about what   dietary choices are best for you. Grains Whole-grain or whole-wheat bread. Whole-grain or whole-wheat pasta. Brown rice. Oatmeal. Quinoa. Bulgur. Whole-grain and low-sodium cereals. Pita bread. Low-fat, low-sodium crackers. Whole-wheat flour tortillas. Vegetables Fresh or frozen vegetables (raw, steamed, roasted, or grilled). Low-sodium or reduced-sodium tomato and vegetable juice. Low-sodium or reduced-sodium tomato sauce and tomato paste. Low-sodium or reduced-sodium canned vegetables. Fruits All fresh, dried, or frozen fruit. Canned fruit in natural juice (without  added sugar). Meat and other protein foods Skinless chicken or turkey. Ground chicken or turkey. Pork with fat trimmed off. Fish and seafood. Egg whites. Dried beans, peas, or lentils. Unsalted nuts, nut butters, and seeds. Unsalted canned beans. Lean cuts of beef with fat trimmed off. Low-sodium, lean deli meat. Dairy Low-fat (1%) or fat-free (skim) milk. Fat-free, low-fat, or reduced-fat cheeses. Nonfat, low-sodium ricotta or cottage cheese. Low-fat or nonfat yogurt. Low-fat, low-sodium cheese. Fats and oils Soft margarine without trans fats. Vegetable oil. Low-fat, reduced-fat, or light mayonnaise and salad dressings (reduced-sodium). Canola, safflower, olive, soybean, and sunflower oils. Avocado. Seasoning and other foods Herbs. Spices. Seasoning mixes without salt. Unsalted popcorn and pretzels. Fat-free sweets. What foods are not recommended? The items listed may not be a complete list. Talk with your dietitian about what dietary choices are best for you. Grains Baked goods made with fat, such as croissants, muffins, or some breads. Dry pasta or rice meal packs. Vegetables Creamed or fried vegetables. Vegetables in a cheese sauce. Regular canned vegetables (not low-sodium or reduced-sodium). Regular canned tomato sauce and paste (not low-sodium or reduced-sodium). Regular tomato and vegetable juice (not low-sodium or reduced-sodium). Pickles. Olives. Fruits Canned fruit in a light or heavy syrup. Fried fruit. Fruit in cream or butter sauce. Meat and other protein foods Fatty cuts of meat. Ribs. Fried meat. Bacon. Sausage. Bologna and other processed lunch meats. Salami. Fatback. Hotdogs. Bratwurst. Salted nuts and seeds. Canned beans with added salt. Canned or smoked fish. Whole eggs or egg yolks. Chicken or turkey with skin. Dairy Whole or 2% milk, cream, and half-and-half. Whole or full-fat cream cheese. Whole-fat or sweetened yogurt. Full-fat cheese. Nondairy creamers. Whipped toppings.  Processed cheese and cheese spreads. Fats and oils Butter. Stick margarine. Lard. Shortening. Ghee. Bacon fat. Tropical oils, such as coconut, palm kernel, or palm oil. Seasoning and other foods Salted popcorn and pretzels. Onion salt, garlic salt, seasoned salt, table salt, and sea salt. Worcestershire sauce. Tartar sauce. Barbecue sauce. Teriyaki sauce. Soy sauce, including reduced-sodium. Steak sauce. Canned and packaged gravies. Fish sauce. Oyster sauce. Cocktail sauce. Horseradish that you find on the shelf. Ketchup. Mustard. Meat flavorings and tenderizers. Bouillon cubes. Hot sauce and Tabasco sauce. Premade or packaged marinades. Premade or packaged taco seasonings. Relishes. Regular salad dressings. Where to find more information:  National Heart, Lung, and Blood Institute: www.nhlbi.nih.gov  American Heart Association: www.heart.org Summary  The DASH eating plan is a healthy eating plan that has been shown to reduce high blood pressure (hypertension). It may also reduce your risk for type 2 diabetes, heart disease, and stroke.  With the DASH eating plan, you should limit salt (sodium) intake to 2,300 mg a day. If you have hypertension, you may need to reduce your sodium intake to 1,500 mg a day.  When on the DASH eating plan, aim to eat more fresh fruits and vegetables, whole grains, lean proteins, low-fat dairy, and heart-healthy fats.  Work with your health care provider or diet and nutrition specialist (dietitian) to adjust your eating plan to your individual   calorie needs. This information is not intended to replace advice given to you by your health care provider. Make sure you discuss any questions you have with your health care provider. Document Released: 03/23/2011 Document Revised: 03/27/2016 Document Reviewed: 03/27/2016 Elsevier Interactive Patient Education  2018 [employer]  

## 2017-04-23 LAB — CBC WITH DIFFERENTIAL/PLATELET
BASOS ABS: 41 {cells}/uL (ref 0–200)
Basophils Relative: 0.7 %
EOS PCT: 2.2 %
Eosinophils Absolute: 130 cells/uL (ref 15–500)
HEMATOCRIT: 38.6 % (ref 35.0–45.0)
Hemoglobin: 12.9 g/dL (ref 11.7–15.5)
LYMPHS ABS: 2744 {cells}/uL (ref 850–3900)
MCH: 30.1 pg (ref 27.0–33.0)
MCHC: 33.4 g/dL (ref 32.0–36.0)
MCV: 90.2 fL (ref 80.0–100.0)
MPV: 10.7 fL (ref 7.5–12.5)
Monocytes Relative: 5.6 %
NEUTROS PCT: 45 %
Neutro Abs: 2655 cells/uL (ref 1500–7800)
Platelets: 474 10*3/uL — ABNORMAL HIGH (ref 140–400)
RBC: 4.28 10*6/uL (ref 3.80–5.10)
RDW: 13 % (ref 11.0–15.0)
Total Lymphocyte: 46.5 %
WBC mixed population: 330 cells/uL (ref 200–950)
WBC: 5.9 10*3/uL (ref 3.8–10.8)

## 2017-04-23 LAB — LIPID PANEL
CHOLESTEROL: 155 mg/dL (ref <200)
HDL: 70 mg/dL (ref >50)
LDL Cholesterol (Calc): 72 mg/dL (calc)
NON-HDL CHOLESTEROL (CALC): 85 mg/dL (ref <130)
TRIGLYCERIDES: 46 mg/dL (ref <150)
Total CHOL/HDL Ratio: 2.2 (calc) (ref <5.0)

## 2017-04-23 LAB — HEMOGLOBIN A1C
HEMOGLOBIN A1C: 5.4 %{Hb} (ref <5.7)
Mean Plasma Glucose: 108 (calc)
eAG (mmol/L): 6 (calc)

## 2017-04-23 LAB — URINALYSIS, ROUTINE W REFLEX MICROSCOPIC
Bacteria, UA: NONE SEEN /HPF
Bilirubin Urine: NEGATIVE
Glucose, UA: NEGATIVE
HYALINE CAST: NONE SEEN /LPF
Ketones, ur: NEGATIVE
Leukocytes, UA: NEGATIVE
NITRITE: NEGATIVE
Protein, ur: NEGATIVE
SPECIFIC GRAVITY, URINE: 1.023 (ref 1.001–1.03)
SQUAMOUS EPITHELIAL / LPF: NONE SEEN /HPF (ref <=5)
WBC, UA: NONE SEEN /HPF (ref 0–5)
pH: 7 (ref 5.0–8.0)

## 2017-04-23 LAB — COMPLETE METABOLIC PANEL WITH GFR
AG RATIO: 1.5 (calc) (ref 1.0–2.5)
ALBUMIN MSPROF: 4.1 g/dL (ref 3.6–5.1)
ALT: 14 U/L (ref 6–29)
AST: 17 U/L (ref 10–35)
Alkaline phosphatase (APISO): 64 U/L (ref 33–115)
BUN: 13 mg/dL (ref 7–25)
CO2: 22 mmol/L (ref 20–32)
CREATININE: 0.74 mg/dL (ref 0.50–1.10)
Calcium: 9.1 mg/dL (ref 8.6–10.2)
Chloride: 107 mmol/L (ref 98–110)
GFR, EST NON AFRICAN AMERICAN: 96 mL/min/{1.73_m2} (ref >=60)
GFR, Est African American: 111 mL/min/{1.73_m2} (ref >=60)
GLOBULIN: 2.8 g/dL (ref 1.9–3.7)
Glucose, Bld: 77 mg/dL (ref 65–99)
Potassium: 4.3 mmol/L (ref 3.5–5.3)
SODIUM: 137 mmol/L (ref 135–146)
Total Bilirubin: 0.3 mg/dL (ref 0.2–1.2)
Total Protein: 6.9 g/dL (ref 6.1–8.1)

## 2017-04-23 LAB — HEPATITIS PANEL, ACUTE
HEP B C IGM: NONREACTIVE
HEP C AB: NONREACTIVE
Hep A IgM: NONREACTIVE
Hepatitis B Surface Ag: NONREACTIVE
SIGNAL TO CUT-OFF: 0.02 (ref <1.00)

## 2017-04-23 LAB — RPR: RPR Ser Ql: NONREACTIVE

## 2017-04-23 LAB — VITAMIN B12: Vitamin B-12: 499 pg/mL (ref 200–1100)

## 2017-04-23 LAB — TSH: TSH: 0.64 mIU/L

## 2017-04-23 LAB — HIV ANTIBODY (ROUTINE TESTING W REFLEX): HIV 1&2 Ab, 4th Generation: NONREACTIVE

## 2017-04-24 ENCOUNTER — Telehealth: Payer: Self-pay | Admitting: Family Medicine

## 2017-04-24 DIAGNOSIS — M5442 Lumbago with sciatica, left side: Principal | ICD-10-CM

## 2017-04-24 DIAGNOSIS — M5441 Lumbago with sciatica, right side: Principal | ICD-10-CM

## 2017-04-24 DIAGNOSIS — G8929 Other chronic pain: Secondary | ICD-10-CM

## 2017-04-24 NOTE — Telephone Encounter (Signed)
Patient's MRI was denied for the following reason: "Based on [facility] Guidelines, we cannot approve this request. MRI might be supported in the evaluation of suspected or known spinal disease with one of the following: 1) failure to improve after a recent (within 3 months) 6 week trial of physician-guided clinical care (treatment or observation) with clinical re-evaluation, or 2) any signs or symptoms such as significant motor weakness, malignancy, infection, cauda equina syndrome, for which conservative treatment is not needed. The clinical information received fails to support meeting these requirements and, therefore, the requested procedure is not indicated at this time."  What do you advise at this time?

## 2017-04-24 NOTE — Telephone Encounter (Signed)
Called patient regarding message below. No answer, unable to leave message.  

## 2017-04-24 NOTE — Telephone Encounter (Signed)
Please call patient and advised her that the insurance company would not approve the MRI for her back.  Please explained to her what to do listed to me in the message.  Advised her that at this time I am going to place a referral for her to see orthopedics for her back.  Referral has been placed and she should get a call or information regarding the appointment.  In addition please ask her to keep her scheduled follow-up and we can discuss more at that visit.

## 2017-04-25 ENCOUNTER — Encounter (HOSPITAL_COMMUNITY): Payer: Self-pay | Admitting: Emergency Medicine

## 2017-04-25 ENCOUNTER — Other Ambulatory Visit: Payer: Self-pay

## 2017-04-25 ENCOUNTER — Emergency Department (HOSPITAL_COMMUNITY)
Admission: EM | Admit: 2017-04-25 | Discharge: 2017-04-25 | Disposition: A | Discharge status: Home / Self Care | Payer: Medicaid Other | Attending: Emergency Medicine | Admitting: Emergency Medicine

## 2017-04-25 DIAGNOSIS — Z87891 Personal history of nicotine dependence: Secondary | ICD-10-CM | POA: Insufficient documentation

## 2017-04-25 DIAGNOSIS — R070 Pain in throat: Secondary | ICD-10-CM | POA: Diagnosis present

## 2017-04-25 DIAGNOSIS — J039 Acute tonsillitis, unspecified: Secondary | ICD-10-CM

## 2017-04-25 DIAGNOSIS — R059 Cough, unspecified: Secondary | ICD-10-CM

## 2017-04-25 DIAGNOSIS — J45909 Unspecified asthma, uncomplicated: Secondary | ICD-10-CM | POA: Insufficient documentation

## 2017-04-25 DIAGNOSIS — R0981 Nasal congestion: Secondary | ICD-10-CM | POA: Diagnosis not present

## 2017-04-25 DIAGNOSIS — R05 Cough: Secondary | ICD-10-CM

## 2017-04-25 DIAGNOSIS — R509 Fever, unspecified: Secondary | ICD-10-CM | POA: Diagnosis not present

## 2017-04-25 DIAGNOSIS — Z79899 Other long term (current) drug therapy: Secondary | ICD-10-CM | POA: Diagnosis not present

## 2017-04-25 DIAGNOSIS — M791 Myalgia, unspecified site: Secondary | ICD-10-CM | POA: Diagnosis not present

## 2017-04-25 DIAGNOSIS — I10 Essential (primary) hypertension: Secondary | ICD-10-CM | POA: Insufficient documentation

## 2017-04-25 HISTORY — DX: Polyneuropathy, unspecified: G62.9

## 2017-04-25 LAB — RAPID STREP SCREEN (MED CTR MEBANE ONLY): Streptococcus, Group A Screen (Direct): NEGATIVE

## 2017-04-25 MED ORDER — MAGIC MOUTHWASH W/LIDOCAINE: 5.0000 mL | Freq: Three times a day (TID) | ORAL | 0 refills | Status: DC | PRN

## 2017-04-25 MED ORDER — HYDROCODONE-ACETAMINOPHEN 7.5-325 MG/15ML PO SOLN
10.0000 mL | Freq: Once | ORAL | Status: AC
  Administered 2017-04-25: 10 mL via ORAL
  Filled 2017-04-25: qty 15

## 2017-04-25 MED ORDER — PENICILLIN G BENZATHINE 1200000 UNIT/2ML IM SUSP
1.2000 10*6.[IU] | Freq: Once | INTRAMUSCULAR | Status: AC
  Administered 2017-04-25: 1.2 10*6.[IU] via INTRAMUSCULAR
  Filled 2017-04-25: qty 2

## 2017-04-25 MED ORDER — BENZONATATE 200 MG PO CAPS: 200.0000 mg | ORAL_CAPSULE | Freq: Three times a day (TID) | ORAL | 0 refills | Status: DC | PRN

## 2017-04-25 NOTE — Telephone Encounter (Signed)
I have been unable to reach this patient by phone.  A letter is being sent.

## 2017-04-25 NOTE — Discharge Instructions (Signed)
Drink plenty of fluids.  Take Tylenol every 4 hrs. For body aches and fever.  Follow-up with your primary doctor for recheck.  Return here for any worsening symptoms

## 2017-04-25 NOTE — ED Triage Notes (Signed)
Pt reports sore throat since Monday. Pt reports cough, congestion, dry throat and intermittent fever. Last known fever last night. Pt denies taking otc fever reduction. nad noted. Pt denies abd pain,n/v/d.

## 2017-04-25 NOTE — ED Provider Notes (Signed)
[facility] EMERGENCY DEPARTMENT Provider Note   CSN: [id number] Arrival date & time: 04/25/17  1324     History   Chief Complaint Chief Complaint  Patient presents with  . Sore Throat    HPI Kathryn [name] is a 47 y.o. female.  HPI   Kathryn [name] is a 47 y.o. female who presents to the Emergency Department complaining of sore throat for 2 days.  She also complains of nonproductive cough nasal congestion and intermittent fever.  Max fever last evening was 101.  She states she does not have any medication at home to help reduce her fever.  She states she has been drinking fluids, but appetite has been diminished.  She also complains of generalized body aches and chills.  She denies any rash, chest pain, abdominal pain, nausea vomiting or diarrhea.  She states her grandchild also has similar symptoms.   Past Medical History:  Diagnosis Date  . Asthma   . Hypertension   . Neuropathy     Patient Active Problem List   Diagnosis Date Noted  . Hypertension goal BP (blood pressure) < 140/90 01/11/2017    Past Surgical History:  Procedure Laterality Date  . TUBAL LIGATION      OB History    Gravida Para Term Preterm AB Living   5 3 2 1 2 3    SAB TAB Ectopic Multiple Live Births   1 1             Home Medications    Prior to Admission medications   Medication Sig Start Date End Date Taking? Authorizing Provider  DULoxetine (CYMBALTA) 30 MG capsule Take 1 capsule (30 mg total) by mouth daily. 04/20/17   Caren Macadam, MD  lisinopril-hydrochlorothiazide (PRINZIDE,ZESTORETIC) 20-25 MG tablet Take 1 tablet by mouth daily. 04/20/17   Caren Macadam, MD    Family History Family History  Problem Relation Age of Onset  . Migraines Neg Hx     Social History Social History   Tobacco Use  . Smoking status: Former Smoker    Types: Cigarettes  . Smokeless tobacco: Never Used  Substance Use Topics  . Alcohol use: Yes    Comment: occ  . Drug use: No      Allergies   Clonopin [clonazepam]; Fish allergy; Flexeril [cyclobenzaprine hcl]; Ibuprofen; and Tramadol   Review of Systems Review of Systems  Constitutional: Positive for appetite change, fatigue and fever. Negative for activity change and chills.  HENT: Positive for congestion and sore throat. Negative for ear pain, facial swelling, trouble swallowing and voice change.   Eyes: Negative for pain and visual disturbance.  Respiratory: Positive for cough. Negative for shortness of breath.   Gastrointestinal: Negative for abdominal pain, nausea and vomiting.  Musculoskeletal: Positive for myalgias. Negative for arthralgias, neck pain and neck stiffness.  Skin: Negative for color change and rash.  Neurological: Negative for dizziness, facial asymmetry, speech difficulty, numbness and headaches.  Hematological: Negative for adenopathy.  All other systems reviewed and are negative.    Physical Exam Updated Vital Signs BP 99/72   Pulse 86   Temp 98.3 F (36.8 C) (Oral)   Resp 18   Ht 4\' 11"  (1.499 m)   Wt 76.2 kg (168 lb)   LMP 04/17/2017 (Exact Date)   SpO2 97%   BMI 33.93 kg/m   Physical Exam  Constitutional: She is oriented to person, place, and time. She appears well-developed and well-nourished. No distress.  HENT:  Head: Normocephalic and atraumatic.  Right Ear: Tympanic membrane and ear canal normal.  Left Ear: Tympanic membrane and ear canal normal.  Mouth/Throat: Uvula is midline and mucous membranes are normal. No trismus in the jaw. No uvula swelling. Posterior oropharyngeal edema and posterior oropharyngeal erythema present. No oropharyngeal exudate or tonsillar abscesses.  Neck: Normal range of motion. Neck supple.  Cardiovascular: Normal rate, regular rhythm and normal heart sounds.  Pulmonary/Chest: Effort normal and breath sounds normal.  Abdominal: Soft. She exhibits no distension. There is no splenomegaly. There is no tenderness.  Musculoskeletal:  Normal range of motion.  Lymphadenopathy:    She has cervical adenopathy.  Neurological: She is alert and oriented to person, place, and time. She exhibits normal muscle tone. Coordination normal.  Skin: Skin is warm and dry. No rash noted.  Psychiatric: She has a normal mood and affect.  Nursing note and vitals reviewed.    ED Treatments / Results  Labs (all labs ordered are listed, but only abnormal results are displayed) Labs Reviewed  RAPID STREP SCREEN (NOT AT [facility])  CULTURE, GROUP A STREP [facility])    EKG  EKG Interpretation None       Radiology No results found.  Procedures Procedures (including critical care time)  Medications Ordered in ED Medications  HYDROcodone-acetaminophen (HYCET) 7.5-325 mg/15 ml solution 10 mL (not administered)  penicillin g benzathine (BICILLIN LA) 1200000 UNIT/2ML injection 1.2 Million Units (not administered)     Initial Impression / Assessment and Plan / ED Course  I have reviewed the triage vital signs and the nursing notes.  Pertinent labs & imaging results that were available during my care of the patient were reviewed by me and considered in my medical decision making (see chart for details).     [id number] on recheck, pt is feeling better.  Tolerating po fluids.  Appears safe for d/c home.  Strep neg, likely tonsillitis.  Agrees to increase fluids at home, tylenol q 4hrs.  And PCP f/u  Final Clinical Impressions(s) / ED Diagnoses   Final diagnoses:  Tonsillitis  Cough    ED Discharge Orders    None       Kem Parkinson, PA-C 04/25/17 0913    Fredia Sorrow, MD 04/25/17 [phone]

## 2017-04-27 LAB — CULTURE, GROUP A STREP (THRC)

## 2017-05-11 ENCOUNTER — Other Ambulatory Visit: Payer: Self-pay

## 2017-05-11 ENCOUNTER — Encounter: Payer: Self-pay | Admitting: Family Medicine

## 2017-05-11 ENCOUNTER — Ambulatory Visit: Payer: Medicaid Other | Admitting: Family Medicine

## 2017-05-11 VITALS — BP 108/70 | HR 89 | Temp 98.1°F | Resp 16 | Ht 59.0 in | Wt 154.8 lb

## 2017-05-11 DIAGNOSIS — Z23 Encounter for immunization: Secondary | ICD-10-CM | POA: Diagnosis not present

## 2017-05-11 DIAGNOSIS — M48061 Spinal stenosis, lumbar region without neurogenic claudication: Secondary | ICD-10-CM | POA: Insufficient documentation

## 2017-05-11 DIAGNOSIS — E538 Deficiency of other specified B group vitamins: Secondary | ICD-10-CM

## 2017-05-11 DIAGNOSIS — M545 Low back pain, unspecified: Secondary | ICD-10-CM

## 2017-05-11 DIAGNOSIS — R7989 Other specified abnormal findings of blood chemistry: Secondary | ICD-10-CM | POA: Insufficient documentation

## 2017-05-11 DIAGNOSIS — F321 Major depressive disorder, single episode, moderate: Secondary | ICD-10-CM | POA: Insufficient documentation

## 2017-05-11 DIAGNOSIS — I10 Essential (primary) hypertension: Secondary | ICD-10-CM

## 2017-05-11 MED ORDER — METHYLPREDNISOLONE ACETATE 80 MG/ML IJ SUSP
80.0000 mg | Freq: Once | INTRAMUSCULAR | Status: AC
  Administered 2017-05-11: 80 mg via INTRAMUSCULAR

## 2017-05-11 MED ORDER — GABAPENTIN 100 MG PO CAPS: 100.0000 mg | ORAL_CAPSULE | Freq: Two times a day (BID) | ORAL | 1 refills | Status: DC

## 2017-05-11 MED ORDER — VITAMIN B-12 1000 MCG PO TABS: 1000.0000 ug | ORAL_TABLET | Freq: Every day | ORAL | Status: DC

## 2017-05-11 MED ORDER — DULOXETINE HCL 30 MG PO CPEP: 30.0000 mg | ORAL_CAPSULE | Freq: Every day | ORAL | 3 refills | Status: DC

## 2017-05-11 MED ORDER — LOSARTAN POTASSIUM 50 MG PO TABS: 50.0000 mg | ORAL_TABLET | Freq: Every day | ORAL | 3 refills | Status: DC

## 2017-05-11 MED ORDER — PREDNISONE 10 MG (48) PO TBPK: ORAL_TABLET | ORAL | 0 refills | Status: DC

## 2017-05-11 NOTE — Progress Notes (Signed)
Patient ID: Kathryn [name], female    DOB: Apr 16, 1971, 47 y.o.   MRN: [id number]  Chief Complaint  Patient presents with  . Follow-up    Allergies Clonopin [clonazepam]; Fish allergy; Flexeril [cyclobenzaprine hcl]; Ibuprofen; and Tramadol  Subjective:   Kathryn [name] is a 47 y.o. female who presents to [facility] today.  HPI Here for follow up.  Reports that she has been doing better in terms of her mood.  She reports that last time she was here she felt like she was going to have a nervous breakdown.  She reports since starting the Cymbalta 30 mg p.o. daily that she has felt much calmer and less stress.  She reports that she is now able to stop and listen now without reacting.  She reports that she is able to deal with daily situations and stressors without getting angry and so easily frustrated.  She has not crying on a daily basis.  She does not feel sad or hopeless.  She denies any suicidal or homicidal reports that overall she feels much better.  She reports that the medicine did wonders for her mood but it is not helped with her pain.  She still complains of pain in her low back area.  She reports that the pain is burning in sensation and starts in the middle of her back and radiates around the sides and down both of her legs.  She reports that she can move her legs okay and has no weakness in her legs but the pain is terrible.  She reports the pain is a 10 out of 10.  She has no difficulty with urination.  No numbness or tingling and her groin region.  She has had no loss of her bowel or bladder function.  She denies any weakness in her extremities.  Denies any fevers, chills, nausea, vomiting, diarrhea.  She reports that she did not hear back regarding the orthopedic referral.  However the orthopedic office did let us know that they tried to contact the patient for times.  She would like this number today so that she can give him a call.  She is interested in trying  some medication for pain relief.  She is allergic to NSAIDs.  She reports that several years ago she was put on Neurontin and it did help with her pain relief.  She reports that she would be willing to try anything to help decrease her pain. She would like to review her labs from when she was last seen here.  She does report that since she has started taking the blood pressure medicine that she has had a cough which is dry in quality.  She denies any shortness of breath or sputum production.  She reports that she feels like her throat is dry and she coughs.     Past Medical History:  Diagnosis Date  . Asthma   . Hypertension   . Neuropathy     Past Surgical History:  Procedure Laterality Date  . TUBAL LIGATION      Family History  Problem Relation Age of Onset  . Migraines Neg Hx      Social History   Socioeconomic History  . Marital status: Single    Spouse name: None  . Number of children: None  . Years of education: None  . Highest education level: None  Social Needs  . Financial resource strain: None  . Food insecurity - worry: None  . Food  insecurity - inability: None  . Transportation needs - medical: None  . Transportation needs - non-medical: None  Occupational History  . None  Tobacco Use  . Smoking status: Former Smoker    Types: Cigarettes  . Smokeless tobacco: Never Used  Substance and Sexual Activity  . Alcohol use: Yes    Comment: occ  . Drug use: No  . Sexual activity: Yes  Other Topics Concern  . None  Social History Narrative  . None   Current Outpatient Medications on File Prior to Visit  Medication Sig Dispense Refill  . magic mouthwash w/lidocaine SOLN Take 5 mLs by mouth 3 (three) times daily as needed for mouth pain. Swish and spit, do not swallow 100 mL 0  . benzonatate (TESSALON) 200 MG capsule Take 1 capsule (200 mg total) by mouth 3 (three) times daily as needed for cough. Swallow whole, do not chew (Patient not taking: Reported on  05/11/2017) 21 capsule 0   No current facility-administered medications on file prior to visit.     Review of Systems  Constitutional: Negative for activity change, appetite change and fever.  Eyes: Negative for visual disturbance.  Respiratory: Positive for cough. Negative for choking, chest tightness, shortness of breath, wheezing and stridor.   Cardiovascular: Negative for chest pain, palpitations and leg swelling.  Gastrointestinal: Negative for abdominal pain, nausea and vomiting.  Genitourinary: Negative for difficulty urinating, dysuria, frequency and urgency.  Musculoskeletal: Positive for back pain. Negative for gait problem, joint swelling, myalgias and neck pain.  Skin: Negative for rash.  Neurological: Negative for dizziness, syncope, facial asymmetry, weakness, light-headedness and headaches.  Hematological: Negative for adenopathy.  Psychiatric/Behavioral: Negative for agitation, behavioral problems, decreased concentration, dysphoric mood and suicidal ideas. The patient is not nervous/anxious.      Objective:   BP 108/70 (BP Location: Left Arm, Patient Position: Sitting, Cuff Size: Normal)   Pulse 89   Temp 98.1 F (36.7 C) (Temporal)   Resp 16   Ht 4\' 11"  (1.499 m)   Wt 154 lb 12 oz (70.2 kg)   LMP 04/17/2017 (Exact Date)   SpO2 97%   BMI 31.26 kg/m   Physical Exam  Constitutional: She is oriented to person, place, and time. She appears well-developed and well-nourished. No distress.  HENT:  Head: Normocephalic and atraumatic.  Eyes: Pupils are equal, round, and reactive to light.  Neck: Normal range of motion. Neck supple. No thyromegaly present.  Cardiovascular: Normal rate, regular rhythm and normal heart sounds.  Pulmonary/Chest: Effort normal and breath sounds normal. No respiratory distress.  Neurological: She is alert and oriented to person, place, and time. She has normal strength. She displays no atrophy and no tremor. No cranial nerve deficit.    Reflex Scores:      Patellar reflexes are 2+ on the right side and 2+ on the left side.      Achilles reflexes are 2+ on the right side and 2+ on the left side. Skin: Skin is warm and dry.  Psychiatric: She has a normal mood and affect. Her speech is normal and behavior is normal. Judgment and thought content normal. She expresses no homicidal and no suicidal ideation. She expresses no suicidal plans and no homicidal plans.  Nursing note and vitals reviewed.    Assessment and Plan  1. Hypertension goal BP (blood pressure) < 140/90 DC lisinopril/HCTZ at this time.  Start losartan 50 mg 1 p.o. Daily. We will need to check BMP at follow-up. - losartan (COZAAR) 50  MG tablet; Take 1 tablet (50 mg total) by mouth daily.  Dispense: 90 tablet; Refill: 3  2. Lumbar back pain Patient was given a copy of the referral for orthopedics.  She will call and schedule her appointment.  At this time will start Neurontin 100 mg, 1 p.o. twice daily.  She was counseled concerning possible sedation precautions with this medication.  We will also have a trial of steroid Dosepak taper and steroid shot today.  She is to not start the steroid Dosepak until tomorrow.  She was counseled that she needs to take this medication with food.  We did discuss the possible side effects with steroid use.  She voiced understanding but would like to give it a try to help her pain. Patient counseled in detail regarding the risks of medication. Told to call or return to clinic if develop any worrisome signs or symptoms. Patient voiced understanding.   - gabapentin (NEURONTIN) 100 MG capsule; Take 1 capsule (100 mg total) by mouth 2 (two) times daily.  Dispense: 60 capsule; Refill: 1 - predniSONE (STERAPRED UNI-PAK 48 TAB) 10 MG (48) TBPK tablet; Use as directed  Dispense: 48 tablet; Refill: 0 - methylPREDNISolone acetate (DEPO-MEDROL) injection 80 mg  3. Depression, major, single episode, moderate ([place]) She has had good improvement  in her mood with Cymbalta.  We will not escalate dose at this time but rather try Neurontin.  If patient does not have any symptomatic improvement could possibly consider increasing dose of Cymbalta to help with this pain which appears to be neuropathic in etiology.  She will continue Cymbalta each day.  Counseled concerning worrisome signs and symptoms of mood disorder.  She is to call or contact medical help if her mood worsens or she develops any suicidal or homicidal ideation. - DULoxetine (CYMBALTA) 30 MG capsule; Take 1 capsule (30 mg total) by mouth daily.  Dispense: 30 capsule; Refill: 3  4. Low vitamin B12 level Patient has borderline low B12 levels.  At this time she will get an over-the-counter B12 supplementation and start this on a daily basis. - vitamin B-12 (CYANOCOBALAMIN) 1000 MCG tablet; Take 1 tablet (1,000 mcg total) by mouth daily.  5. Immunization due - Tdap vaccine greater than or equal to 7yo IM   She will follow-up in 1 month for recheck of her blood pressure.  Will check BMP at that time.  She will call with any questions or concerns.  I asked her to call and let us know if she gets any improvement with the steroids and/or the Neurontin.  We did discuss that we can escalate the dose of Neurontin if needed.  She was counseled concerning worrisome signs and symptoms of back pain and if those develop she will contact our office or go to the emergency department. Return in about 4 weeks (around 06/08/2017). Caren Macadam, MD 05/11/2017

## 2017-05-11 NOTE — Patient Instructions (Signed)
B12 1000 mcg a day -BUY at [employer].   Stop lisinopril /HCTZ due to cough  Start losartan 50 mg a day  Continue the cymbalta  Start neurontin/gabapentin 100 mg twice a day for pain  Start the steroid dose pack tomorrow. Take with food.

## 2017-05-23 ENCOUNTER — Telehealth: Payer: Self-pay | Admitting: Family Medicine

## 2017-05-23 NOTE — Telephone Encounter (Signed)
Pt is calling to tell Dr Mannie Stabile that she is the BEST.Marland Kitchen And she went to the referral and they gave her a cortisone shot, and it helped. She needs that mood medicine called in.

## 2017-05-23 NOTE — Telephone Encounter (Signed)
noted 

## 2017-05-24 ENCOUNTER — Telehealth: Payer: Self-pay | Admitting: Family Medicine

## 2017-05-30 ENCOUNTER — Ambulatory Visit: Payer: Medicaid Other | Admitting: Family Medicine

## 2017-05-31 ENCOUNTER — Telehealth: Payer: Self-pay | Admitting: Family Medicine

## 2017-05-31 NOTE — Telephone Encounter (Signed)
Patient left a voicemail requesting pain medication because the orthopedic will not give this to her Cb#: [phone]

## 2017-06-01 ENCOUNTER — Telehealth: Payer: Self-pay

## 2017-06-01 DIAGNOSIS — M545 Low back pain, unspecified: Secondary | ICD-10-CM

## 2017-06-01 MED ORDER — GABAPENTIN 100 MG PO CAPS: 200.0000 mg | ORAL_CAPSULE | Freq: Three times a day (TID) | ORAL | 0 refills | Status: DC

## 2017-06-01 NOTE — Telephone Encounter (Signed)
She can increase the Neurontin 100mg  to 2 pills 3 times a day.  She is allergic to NSAIDs and tramadol.  Therefore only alternative would be for her to use Tylenol 500 mg, 2 pills 3 times a day over-the-counter as needed in addition to the Neurontin.  I will not call in narcotics.  He is advise of this, she should have enough of the Neurontin to increase the dose and she can give [name] a call or follow-up on Monday and let us know how she is doing.  She can always go to the emergency department if pain is a lot more severe.

## 2017-06-01 NOTE — Telephone Encounter (Signed)
Called patient regarding message below. No answer, unable to leave message.  

## 2017-06-01 NOTE — Telephone Encounter (Signed)
See other telephone message 

## 2017-06-01 NOTE — Telephone Encounter (Signed)
Patient informed of message below, verbalized understanding.  

## 2017-06-01 NOTE — Telephone Encounter (Signed)
What is she requesting? This patient should be called prior to sending the message to me.  Gwen Her. Mannie Stabile, MD

## 2017-06-01 NOTE — Telephone Encounter (Signed)
Pt called again wanting pain medication for her back. Per Dr Roma Kayser note  I ask her what med she was requesting and she said she does not know. She just wants something to make her back not hurt. Please call her and let her know if we can do anything for her.

## 2017-06-08 ENCOUNTER — Ambulatory Visit: Payer: Medicaid Other | Admitting: Family Medicine

## 2017-06-12 NOTE — Telephone Encounter (Signed)
Dr. Mannie Stabile did not tell me she was going to dismiss her. I had tried to reach patient per Dr. Roma Kayser request to find out why she did not make it to her appointment. I will clarify with Dr. Mannie Stabile that she does want to dismiss her before I type the letter, just so we are all on the same page.

## 2017-06-12 NOTE — Telephone Encounter (Signed)
Pt called today to re-schedule her 2/22 appt that she no-showed for.  This is her 3rd no show and I told her she would be dismissed due to no show X 3.  Please process her letter and I will mail it.  I did not schedule her appointment. I told her she would be getting a letter in the mail.

## 2017-08-08 ENCOUNTER — Other Ambulatory Visit: Payer: Self-pay | Admitting: Family Medicine

## 2017-08-08 DIAGNOSIS — M545 Low back pain, unspecified: Secondary | ICD-10-CM

## 2017-09-19 ENCOUNTER — Emergency Department (HOSPITAL_COMMUNITY): Payer: No Typology Code available for payment source

## 2017-09-19 ENCOUNTER — Encounter (HOSPITAL_COMMUNITY): Payer: Self-pay | Admitting: Emergency Medicine

## 2017-09-19 ENCOUNTER — Emergency Department (HOSPITAL_COMMUNITY)
Admission: EM | Admit: 2017-09-19 | Discharge: 2017-09-19 | Disposition: A | Discharge status: Home / Self Care | Payer: No Typology Code available for payment source | Attending: Emergency Medicine | Admitting: Emergency Medicine

## 2017-09-19 ENCOUNTER — Other Ambulatory Visit: Payer: Self-pay

## 2017-09-19 DIAGNOSIS — M542 Cervicalgia: Secondary | ICD-10-CM | POA: Insufficient documentation

## 2017-09-19 DIAGNOSIS — Z87891 Personal history of nicotine dependence: Secondary | ICD-10-CM | POA: Insufficient documentation

## 2017-09-19 DIAGNOSIS — J45909 Unspecified asthma, uncomplicated: Secondary | ICD-10-CM | POA: Diagnosis not present

## 2017-09-19 DIAGNOSIS — I10 Essential (primary) hypertension: Secondary | ICD-10-CM | POA: Diagnosis not present

## 2017-09-19 DIAGNOSIS — M25462 Effusion, left knee: Secondary | ICD-10-CM | POA: Diagnosis not present

## 2017-09-19 DIAGNOSIS — M5442 Lumbago with sciatica, left side: Secondary | ICD-10-CM | POA: Diagnosis not present

## 2017-09-19 HISTORY — DX: Dorsalgia, unspecified: M54.9

## 2017-09-19 HISTORY — DX: Other chronic pain: G89.29

## 2017-09-19 MED ORDER — ACETAMINOPHEN 500 MG PO TABS: 1000.0000 mg | ORAL_TABLET | Freq: Four times a day (QID) | ORAL | 0 refills | Status: AC | PRN

## 2017-09-19 MED ORDER — ACETAMINOPHEN 500 MG PO TABS
1000.0000 mg | ORAL_TABLET | Freq: Once | ORAL | Status: AC
  Administered 2017-09-19: 1000 mg via ORAL
  Filled 2017-09-19: qty 2

## 2017-09-19 MED ORDER — METHOCARBAMOL 500 MG PO TABS: 500.0000 mg | ORAL_TABLET | Freq: Three times a day (TID) | ORAL | 0 refills | Status: AC

## 2017-09-19 MED ORDER — ALBUTEROL SULFATE HFA 108 (90 BASE) MCG/ACT IN AERS
2.0000 | INHALATION_SPRAY | Freq: Once | RESPIRATORY_TRACT | Status: AC
  Administered 2017-09-19: 2 via RESPIRATORY_TRACT
  Filled 2017-09-19: qty 6.7

## 2017-09-19 MED ORDER — METHOCARBAMOL 500 MG PO TABS
500.0000 mg | ORAL_TABLET | Freq: Once | ORAL | Status: AC
  Administered 2017-09-19: 500 mg via ORAL
  Filled 2017-09-19: qty 1
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
## 2017-10-18 ENCOUNTER — Encounter (HOSPITAL_COMMUNITY): Payer: Self-pay

## 2017-10-18 ENCOUNTER — Emergency Department (HOSPITAL_COMMUNITY): Payer: Medicaid Other

## 2017-10-18 ENCOUNTER — Other Ambulatory Visit: Payer: Self-pay

## 2017-10-18 ENCOUNTER — Emergency Department (HOSPITAL_COMMUNITY)
Admission: EM | Admit: 2017-10-18 | Discharge: 2017-10-18 | Disposition: A | Discharge status: Home / Self Care | Payer: Medicaid Other | Attending: Emergency Medicine | Admitting: Emergency Medicine

## 2017-10-18 DIAGNOSIS — M25562 Pain in left knee: Secondary | ICD-10-CM | POA: Insufficient documentation

## 2017-10-18 DIAGNOSIS — J45909 Unspecified asthma, uncomplicated: Secondary | ICD-10-CM | POA: Diagnosis not present

## 2017-10-18 DIAGNOSIS — Z87891 Personal history of nicotine dependence: Secondary | ICD-10-CM | POA: Insufficient documentation

## 2017-10-18 DIAGNOSIS — I10 Essential (primary) hypertension: Secondary | ICD-10-CM | POA: Diagnosis not present

## 2017-10-18 DIAGNOSIS — M545 Low back pain, unspecified: Secondary | ICD-10-CM

## 2017-10-18 DIAGNOSIS — G8929 Other chronic pain: Secondary | ICD-10-CM

## 2017-10-18 LAB — POC URINE PREG, ED: Preg Test, Ur: NEGATIVE

## 2017-10-18 LAB — URINALYSIS, ROUTINE W REFLEX MICROSCOPIC
Bacteria, UA: NONE SEEN
Bilirubin Urine: NEGATIVE
GLUCOSE, UA: NEGATIVE mg/dL
Ketones, ur: NEGATIVE mg/dL
Leukocytes, UA: NEGATIVE
NITRITE: NEGATIVE
PH: 5 (ref 5.0–8.0)
Protein, ur: NEGATIVE mg/dL
SPECIFIC GRAVITY, URINE: 1.026 (ref 1.005–1.030)

## 2017-10-18 MED ORDER — HYDROCODONE-ACETAMINOPHEN 5-325 MG PO TABS: ORAL_TABLET | ORAL | 0 refills | Status: DC

## 2017-10-18 MED ORDER — HYDROCODONE-ACETAMINOPHEN 5-325 MG PO TABS
1.0000 | ORAL_TABLET | Freq: Once | ORAL | Status: AC
  Administered 2017-10-18: 1 via ORAL
  Filled 2017-10-18: qty 1
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
## 2017-10-18 NOTE — ED Notes (Signed)
Patient transported to X-ray 

## 2017-10-18 NOTE — ED Triage Notes (Signed)
Pt has a burning sensation in lower back and left knee. Pain started on Monday. Has been elevating knee. Knee looks comparable to the right knee. Pt ambulatory with unsteady gait.

## 2017-10-18 NOTE — Discharge Instructions (Addendum)
Wear the knee sleeve as needed for support with walking or standing.  Do not wear continuously or at bedtime.  You may apply ice packs on and off to your back and knee.  Call Dr. Ruthe Mannan office to arrange a follow-up appointment

## 2017-10-20 LAB — URINE CULTURE

## 2017-10-31 ENCOUNTER — Ambulatory Visit: Payer: Self-pay | Admitting: Orthopaedic Surgery

## 2017-12-24 ENCOUNTER — Inpatient Hospital Stay (HOSPITAL_COMMUNITY)
Admission: AD | Admit: 2017-12-24 | Discharge: 2017-12-28 | DRG: 885 | Disposition: A | Discharge status: Home / Self Care | Payer: Medicaid Other | Source: Intra-hospital | Attending: Psychiatry | Admitting: Psychiatry

## 2017-12-24 ENCOUNTER — Other Ambulatory Visit: Payer: Self-pay

## 2017-12-24 ENCOUNTER — Emergency Department (HOSPITAL_COMMUNITY)
Admission: EM | Admit: 2017-12-24 | Discharge: 2017-12-24 | Disposition: A | Discharge status: Other Acute Inpatient Hospital | Payer: Medicaid Other | Attending: Emergency Medicine | Admitting: Emergency Medicine

## 2017-12-24 ENCOUNTER — Encounter (HOSPITAL_COMMUNITY): Payer: Self-pay | Admitting: Emergency Medicine

## 2017-12-24 ENCOUNTER — Encounter (HOSPITAL_COMMUNITY): Payer: Self-pay | Admitting: *Deleted

## 2017-12-24 DIAGNOSIS — F332 Major depressive disorder, recurrent severe without psychotic features: Secondary | ICD-10-CM | POA: Insufficient documentation

## 2017-12-24 DIAGNOSIS — I10 Essential (primary) hypertension: Secondary | ICD-10-CM | POA: Diagnosis present

## 2017-12-24 DIAGNOSIS — R000 Tachycardia, unspecified: Secondary | ICD-10-CM | POA: Diagnosis not present

## 2017-12-24 DIAGNOSIS — G8929 Other chronic pain: Secondary | ICD-10-CM | POA: Diagnosis present

## 2017-12-24 DIAGNOSIS — F1420 Cocaine dependence, uncomplicated: Secondary | ICD-10-CM | POA: Diagnosis present

## 2017-12-24 DIAGNOSIS — Z91013 Allergy to seafood: Secondary | ICD-10-CM | POA: Diagnosis not present

## 2017-12-24 DIAGNOSIS — M179 Osteoarthritis of knee, unspecified: Secondary | ICD-10-CM | POA: Diagnosis present

## 2017-12-24 DIAGNOSIS — Z888 Allergy status to other drugs, medicaments and biological substances status: Secondary | ICD-10-CM | POA: Diagnosis not present

## 2017-12-24 DIAGNOSIS — R45851 Suicidal ideations: Secondary | ICD-10-CM | POA: Insufficient documentation

## 2017-12-24 DIAGNOSIS — J45909 Unspecified asthma, uncomplicated: Secondary | ICD-10-CM | POA: Insufficient documentation

## 2017-12-24 DIAGNOSIS — F419 Anxiety disorder, unspecified: Secondary | ICD-10-CM | POA: Diagnosis present

## 2017-12-24 DIAGNOSIS — Z79899 Other long term (current) drug therapy: Secondary | ICD-10-CM

## 2017-12-24 DIAGNOSIS — I959 Hypotension, unspecified: Secondary | ICD-10-CM | POA: Diagnosis not present

## 2017-12-24 DIAGNOSIS — Z87891 Personal history of nicotine dependence: Secondary | ICD-10-CM

## 2017-12-24 DIAGNOSIS — M1712 Unilateral primary osteoarthritis, left knee: Secondary | ICD-10-CM | POA: Diagnosis present

## 2017-12-24 DIAGNOSIS — Z23 Encounter for immunization: Secondary | ICD-10-CM | POA: Diagnosis not present

## 2017-12-24 DIAGNOSIS — F29 Unspecified psychosis not due to a substance or known physiological condition: Secondary | ICD-10-CM | POA: Diagnosis not present

## 2017-12-24 DIAGNOSIS — T5492XA Toxic effect of unspecified corrosive substance, intentional self-harm, initial encounter: Secondary | ICD-10-CM

## 2017-12-24 DIAGNOSIS — R52 Pain, unspecified: Secondary | ICD-10-CM | POA: Diagnosis not present

## 2017-12-24 DIAGNOSIS — Z79891 Long term (current) use of opiate analgesic: Secondary | ICD-10-CM

## 2017-12-24 DIAGNOSIS — M25561 Pain in right knee: Secondary | ICD-10-CM | POA: Diagnosis present

## 2017-12-24 DIAGNOSIS — F1490 Cocaine use, unspecified, uncomplicated: Secondary | ICD-10-CM | POA: Diagnosis not present

## 2017-12-24 DIAGNOSIS — F1410 Cocaine abuse, uncomplicated: Secondary | ICD-10-CM | POA: Insufficient documentation

## 2017-12-24 DIAGNOSIS — Z885 Allergy status to narcotic agent status: Secondary | ICD-10-CM

## 2017-12-24 DIAGNOSIS — G4700 Insomnia, unspecified: Secondary | ICD-10-CM | POA: Diagnosis present

## 2017-12-24 DIAGNOSIS — F329 Major depressive disorder, single episode, unspecified: Secondary | ICD-10-CM | POA: Diagnosis present

## 2017-12-24 LAB — CBC WITH DIFFERENTIAL/PLATELET
BASOS ABS: 0 10*3/uL (ref 0.0–0.1)
BASOS PCT: 0 %
EOS ABS: 0.1 10*3/uL (ref 0.0–0.7)
EOS PCT: 1 %
HCT: 41.5 % (ref 36.0–46.0)
Hemoglobin: 14.3 g/dL (ref 12.0–15.0)
LYMPHS PCT: 34 %
Lymphs Abs: 4.2 10*3/uL — ABNORMAL HIGH (ref 0.7–4.0)
MCH: 31.7 pg (ref 26.0–34.0)
MCHC: 34.5 g/dL (ref 30.0–36.0)
MCV: 92 fL (ref 78.0–100.0)
Monocytes Absolute: 0.6 10*3/uL (ref 0.1–1.0)
Monocytes Relative: 5 %
Neutro Abs: 7.4 10*3/uL (ref 1.7–7.7)
Neutrophils Relative %: 60 %
PLATELETS: 540 10*3/uL — AB (ref 150–400)
RBC: 4.51 MIL/uL (ref 3.87–5.11)
RDW: 13 % (ref 11.5–15.5)
WBC: 12.3 10*3/uL — AB (ref 4.0–10.5)

## 2017-12-24 LAB — ETHANOL: Alcohol, Ethyl (B): <10 mg/dL (ref <10)

## 2017-12-24 LAB — URINALYSIS, ROUTINE W REFLEX MICROSCOPIC
Bilirubin Urine: NEGATIVE
Glucose, UA: NEGATIVE mg/dL
Hgb urine dipstick: NEGATIVE
KETONES UR: NEGATIVE mg/dL
LEUKOCYTES UA: NEGATIVE
NITRITE: NEGATIVE
PROTEIN: NEGATIVE mg/dL
Specific Gravity, Urine: 1.017 (ref 1.005–1.030)
pH: 6 (ref 5.0–8.0)

## 2017-12-24 LAB — COMPREHENSIVE METABOLIC PANEL
ALBUMIN: 4.7 g/dL (ref 3.5–5.0)
ALK PHOS: 58 U/L (ref 38–126)
ALT: 18 U/L (ref 0–44)
AST: 28 U/L (ref 15–41)
Anion gap: 13 (ref 5–15)
BUN: 12 mg/dL (ref 6–20)
CO2: 18 mmol/L — AB (ref 22–32)
CREATININE: 0.78 mg/dL (ref 0.44–1.00)
Calcium: 9.6 mg/dL (ref 8.9–10.3)
Chloride: 106 mmol/L (ref 98–111)
GFR calc Af Amer: >60 mL/min (ref >60)
GFR calc non Af Amer: >60 mL/min (ref >60)
Glucose, Bld: 86 mg/dL (ref 70–99)
Potassium: 3.9 mmol/L (ref 3.5–5.1)
SODIUM: 137 mmol/L (ref 135–145)
TOTAL PROTEIN: 8.7 g/dL — AB (ref 6.5–8.1)
Total Bilirubin: 0.5 mg/dL (ref 0.3–1.2)

## 2017-12-24 LAB — RAPID URINE DRUG SCREEN, HOSP PERFORMED
Amphetamines: NOT DETECTED
BENZODIAZEPINES: NOT DETECTED
Barbiturates: NOT DETECTED
COCAINE: POSITIVE — AB
OPIATES: NOT DETECTED
Tetrahydrocannabinol: NOT DETECTED

## 2017-12-24 LAB — LIPASE, BLOOD: LIPASE: 29 U/L (ref 11–51)

## 2017-12-24 MED ORDER — LOSARTAN POTASSIUM 25 MG PO TABS: 25.0000 mg | ORAL_TABLET | Freq: Two times a day (BID) | ORAL | Status: DC

## 2017-12-24 MED ORDER — ALUM & MAG HYDROXIDE-SIMETH 200-200-20 MG/5ML PO SUSP
30.0000 mL | ORAL | Status: DC | PRN
  Administered 2017-12-27 (×2): 30 mL via ORAL
  Filled 2017-12-24 (×2): qty 30

## 2017-12-24 MED ORDER — ACETAMINOPHEN 325 MG PO TABS
650.0000 mg | ORAL_TABLET | Freq: Four times a day (QID) | ORAL | Status: DC | PRN
  Administered 2017-12-25 – 2017-12-27 (×4): 650 mg via ORAL
  Filled 2017-12-24 (×4): qty 2

## 2017-12-24 MED ORDER — GABAPENTIN 300 MG PO CAPS
300.0000 mg | ORAL_CAPSULE | Freq: Two times a day (BID) | ORAL | Status: DC
  Administered 2017-12-24 – 2017-12-26 (×4): 300 mg via ORAL
  Filled 2017-12-24 (×7): qty 1

## 2017-12-24 MED ORDER — SUCRALFATE 1 GM/10ML PO SUSP
1.0000 g | Freq: Three times a day (TID) | ORAL | Status: DC
  Administered 2017-12-24 – 2017-12-28 (×14): 1 g via ORAL
  Filled 2017-12-24 (×27): qty 10

## 2017-12-24 MED ORDER — MAGNESIUM HYDROXIDE 400 MG/5ML PO SUSP: 30.0000 mL | Freq: Every day | ORAL | Status: DC | PRN

## 2017-12-24 MED ORDER — TRAZODONE HCL 50 MG PO TABS: 50.0000 mg | ORAL_TABLET | Freq: Two times a day (BID) | ORAL | Status: DC

## 2017-12-24 MED ORDER — LORATADINE 10 MG PO TABS: 5.0000 mg | ORAL_TABLET | Freq: Two times a day (BID) | ORAL | Status: DC

## 2017-12-24 MED ORDER — GABAPENTIN 300 MG PO CAPS: 300.0000 mg | ORAL_CAPSULE | Freq: Two times a day (BID) | ORAL | Status: DC

## 2017-12-24 MED ORDER — HYDROCHLOROTHIAZIDE 25 MG PO TABS
12.5000 mg | ORAL_TABLET | Freq: Every day | ORAL | Status: DC
  Administered 2017-12-24: 12.5 mg via ORAL
  Filled 2017-12-24: qty 1

## 2017-12-24 MED ORDER — CITALOPRAM HYDROBROMIDE 10 MG PO TABS
10.0000 mg | ORAL_TABLET | Freq: Two times a day (BID) | ORAL | Status: DC
  Administered 2017-12-25: 10 mg via ORAL
  Filled 2017-12-24 (×3): qty 1

## 2017-12-24 MED ORDER — LOSARTAN POTASSIUM 50 MG PO TABS
ORAL_TABLET | ORAL | Status: AC
  Administered 2017-12-24: 25 mg via ORAL
  Filled 2017-12-24: qty 1

## 2017-12-24 MED ORDER — SODIUM CHLORIDE 0.9 % IV BOLUS
1000.0000 mL | Freq: Once | INTRAVENOUS | Status: AC
  Administered 2017-12-24: 1000 mL via INTRAVENOUS

## 2017-12-24 MED ORDER — INFLUENZA VAC SPLIT QUAD 0.5 ML IM SUSY
0.5000 mL | PREFILLED_SYRINGE | INTRAMUSCULAR | Status: AC
  Administered 2017-12-25: 0.5 mL via INTRAMUSCULAR
  Filled 2017-12-24: qty 0.5

## 2017-12-24 MED ORDER — HYDROCHLOROTHIAZIDE 12.5 MG PO TABS
12.5000 mg | ORAL_TABLET | Freq: Every day | ORAL | Status: DC
  Administered 2017-12-25 – 2017-12-28 (×4): 12.5 mg via ORAL
  Filled 2017-12-24 (×6): qty 1

## 2017-12-24 MED ORDER — DULOXETINE HCL 30 MG PO CPEP
30.0000 mg | ORAL_CAPSULE | Freq: Every day | ORAL | Status: DC
  Administered 2017-12-24: 30 mg via ORAL
  Filled 2017-12-24: qty 1

## 2017-12-24 MED ORDER — TRAZODONE HCL 50 MG PO TABS
50.0000 mg | ORAL_TABLET | Freq: Two times a day (BID) | ORAL | Status: DC
  Administered 2017-12-24: 50 mg via ORAL
  Filled 2017-12-24 (×5): qty 1

## 2017-12-24 MED ORDER — LOSARTAN POTASSIUM 25 MG PO TABS
25.0000 mg | ORAL_TABLET | Freq: Two times a day (BID) | ORAL | Status: DC
  Administered 2017-12-24 – 2017-12-28 (×8): 25 mg via ORAL
  Filled 2017-12-24 (×12): qty 1

## 2017-12-24 MED ORDER — DULOXETINE HCL 30 MG PO CPEP
30.0000 mg | ORAL_CAPSULE | Freq: Every day | ORAL | Status: DC
  Administered 2017-12-25: 30 mg via ORAL
  Filled 2017-12-24 (×2): qty 1

## 2017-12-24 MED ORDER — SUCRALFATE 1 GM/10ML PO SUSP
1.0000 g | Freq: Three times a day (TID) | ORAL | Status: DC
  Administered 2017-12-24 (×2): 1 g via ORAL
  Filled 2017-12-24 (×2): qty 10

## 2017-12-24 MED ORDER — CITALOPRAM HYDROBROMIDE 10 MG PO TABS
10.0000 mg | ORAL_TABLET | Freq: Two times a day (BID) | ORAL | Status: DC
  Administered 2017-12-24: 10 mg via ORAL
  Filled 2017-12-24 (×4): qty 1

## 2017-12-24 MED ORDER — LORATADINE 10 MG PO TABS
5.0000 mg | ORAL_TABLET | Freq: Two times a day (BID) | ORAL | Status: DC
  Administered 2017-12-24: 5 mg via ORAL
  Filled 2017-12-24 (×4): qty 0.5
  Filled 2017-12-24: qty 1

## 2017-12-24 NOTE — Tx Team (Signed)
`  Initial Treatment Plan 12/24/2017 8:11 PM Kathryn [name] [phone]    PATIENT STRESSORS: Marital or family conflict Substance abuse   PATIENT STRENGTHS: Ability for insight Average or above average intelligence Capable of independent living General fund of knowledge   PATIENT IDENTIFIED PROBLEMS: Depression Suicidal thoughts Suicidal behaviors "Help me to cope"                     DISCHARGE CRITERIA:  Ability to meet basic life and health needs Improved stabilization in mood, thinking, and/or behavior Verbal commitment to aftercare and medication compliance  PRELIMINARY DISCHARGE PLAN: Attend aftercare/continuing care group Return to previous living arrangement  PATIENT/FAMILY INVOLVEMENT: This treatment plan has been presented to and reviewed with the patient, Kathryn [name], and/or family member, .  The patient and family have been given the opportunity to ask questions and make suggestions.  Blessings [place], [place], [place] 12/24/2017, 8:11 PM

## 2017-12-24 NOTE — ED Notes (Signed)
Voluntary consent signed and faxed to [facility] at this time.

## 2017-12-24 NOTE — ED Notes (Signed)
Pt changed into paper scrubs, dress was put in belongings bag and placed in locker.

## 2017-12-24 NOTE — ED Notes (Signed)
[facility] on the way to pick up the pt at this time.

## 2017-12-24 NOTE — ED Provider Notes (Addendum)
[facility] EMERGENCY DEPARTMENT Provider Note   CSN: [id number] Arrival date & time: 12/24/17  1309     History   Chief Complaint Chief Complaint  Patient presents with  . V70.1    HPI Kathryn [name] is a 47 y.o. female.  HPI Libby Maw after possible suicide attempt. She has a history of depression, chronic pain, and substance abuse. She notes that after a relapse she had a disagreement with her son, who she reports is her sole caregiver. She felt particularly stressed, depressed, and ingested a mixture of vinegar and bleach. She subsequently drank a cup of water. She had multiple episodes of vomiting afterwards, and since that time has had pain in her epigastrium, left upper quadrant, and throat. However, no additional episodes of vomiting, no syncope, no other pain. Patient gives an unclear account of her rationale for ingesting the mixture, acknowledges depression, acknowledges disappointment, but seemingly denies current suicidal thoughts.  Past Medical History:  Diagnosis Date  . Asthma   . Chronic back pain   . Hypertension   . Neuropathy     Patient Active Problem List   Diagnosis Date Noted  . Lumbar back pain 05/11/2017  . Depression, major, single episode, moderate ([place]) 05/11/2017  . Low vitamin B12 level 05/11/2017  . Immunization due 05/11/2017  . Hypertension goal BP (blood pressure) < 140/90 01/11/2017    Past Surgical History:  Procedure Laterality Date  . TUBAL LIGATION       OB History    Gravida  5   Para  3   Term  2   Preterm  1   AB  2   Living  3     SAB  1   TAB  1   Ectopic      Multiple      Live Births               Home Medications    Prior to Admission medications   Medication Sig Start Date End Date Taking? Authorizing Provider  citalopram (CELEXA) 20 MG tablet Take 10 mg by mouth 2 (two) times daily.   Yes [provider]  DULoxetine (CYMBALTA) 30 MG capsule Take 30 mg by mouth daily.   Yes  [provider]  gabapentin (NEURONTIN) 300 MG capsule Take 300 mg by mouth 2 (two) times daily.   Yes [provider]  hydrochlorothiazide (MICROZIDE) 12.5 MG capsule Take 12.5 mg by mouth daily.   Yes [provider]  loratadine (CLARITIN) 10 MG tablet Take 5 mg by mouth 2 (two) times daily.   Yes [provider]  losartan (COZAAR) 50 MG tablet Take 25 mg by mouth 2 (two) times daily.   Yes [provider]  traZODone (DESYREL) 100 MG tablet Take 50 mg by mouth 2 (two) times daily.   Yes [provider]    Family History Family History  Problem Relation Age of Onset  . Migraines Neg Hx     Social History Social History   Tobacco Use  . Smoking status: Former Smoker    Types: Cigarettes  . Smokeless tobacco: Never Used  Substance Use Topics  . Alcohol use: Yes    Comment: occ  . Drug use: Yes    Types: Cocaine    Comment: crack     Allergies   Clonopin [clonazepam]; Fish allergy; Flexeril [cyclobenzaprine hcl]; Ibuprofen; Ace inhibitors; and Tramadol   Review of Systems Review of Systems  Constitutional:  Per HPI, otherwise negative  HENT:       Per HPI, otherwise negative  Respiratory:       Per HPI, otherwise negative  Cardiovascular:       Per HPI, otherwise negative  Gastrointestinal: Positive for nausea and vomiting.  Endocrine:       Negative aside from HPI  Genitourinary:       Neg aside from HPI   Musculoskeletal:       Per HPI, otherwise negative  Skin: Negative.   Neurological: Negative for syncope.  Psychiatric/Behavioral: Positive for dysphoric mood and self-injury. The patient is nervous/anxious.      Physical Exam Updated Vital Signs BP (!) 149/95   Pulse 87   Temp 97.9 F (36.6 C) (Oral)   Resp 20   Ht 4\' 11"  (1.499 m)   Wt 61 kg   LMP 11/26/2017   SpO2 98%   BMI 27.16 kg/m   Physical Exam  Constitutional: She is oriented to person, place, and time. She appears  well-developed and well-nourished. No distress.  HENT:  Head: Normocephalic and atraumatic.  Eyes: Conjunctivae and EOM are normal.  Cardiovascular: Normal rate and regular rhythm.  Pulmonary/Chest: Effort normal and breath sounds normal. No stridor. No respiratory distress.  Abdominal: She exhibits no distension.    Musculoskeletal: She exhibits no edema.  Neurological: She is alert and oriented to person, place, and time. No cranial nerve deficit.  Skin: Skin is warm and dry.  Psychiatric: Her mood appears anxious. She is slowed. She exhibits a depressed mood.  Nursing note and vitals reviewed.    ED Treatments / Results  Labs (all labs ordered are listed, but only abnormal results are displayed) Labs Reviewed  COMPREHENSIVE METABOLIC PANEL - Abnormal; Notable for the following components:      Result Value   CO2 18 (*)    Total Protein 8.7 (*)    All other components within normal limits  CBC WITH DIFFERENTIAL/PLATELET - Abnormal; Notable for the following components:   WBC 12.3 (*)    Platelets 540 (*)    Lymphs Abs 4.2 (*)    All other components within normal limits  URINALYSIS, ROUTINE W REFLEX MICROSCOPIC - Abnormal; Notable for the following components:   APPearance HAZY (*)    All other components within normal limits  RAPID URINE DRUG SCREEN, HOSP PERFORMED - Abnormal; Notable for the following components:   Cocaine POSITIVE (*)    All other components within normal limits  LIPASE, BLOOD  ETHANOL    EKG EKG Interpretation  Date/Time:  Monday December 24 2017 14:14:57 EDT Ventricular Rate:  90 PR Interval:    QRS Duration: 90 QT Interval:  415 QTC Calculation: 508 R Axis:   73 Text Interpretation:  Sinus rhythm Borderline prolonged QT interval Artifact Abnormal ekg Confirmed by Carmin Muskrat [phone]) on 12/24/2017 4:10:51 PM   Radiology No results found.  Procedures Procedures (including critical care time)  Medications Ordered in ED Medications    sucralfate (CARAFATE) 1 GM/10ML suspension 1 g (1 g Oral Given 12/24/17 1413)  sodium chloride 0.9 % bolus 1,000 mL (0 mLs Intravenous Stopped 12/24/17 1514)     Initial Impression / Assessment and Plan / ED Course  I have reviewed the triage vital signs and the nursing notes.  Pertinent labs & imaging results that were available during my care of the patient were reviewed by me and considered in my medical decision making (see chart for details).    With concern  for toxic ingestion, patient had labs, received IV fluids, sucralfate for esophageal coating after the initial evaluation.   4:37 PM Patient's labs generally reassuring. Patient's tachycardia is resolved after IV fluids, she appears calm.  This 47 year old female presents after ingestion of vinegar and bleach, reportedly mixed together. The combination likely had some neutralizing effect on each other, and the patient has no evidence for ongoing respiratory compromise, mediastinitis, substantial discomfort. Labs similarly reassuring, patient medically cleared for psychiatric evaluation.  Final Clinical Impressions(s) / ED Diagnoses  Ingestion of bleach, initial encounter Suicidal ideation   Carmin Muskrat, MD 12/24/17 1637    Carmin Muskrat, MD 12/24/17 [phone]

## 2017-12-24 NOTE — ED Notes (Signed)
Called [facility] for transport to [facility]. 

## 2017-12-24 NOTE — Progress Notes (Signed)
Kathryn [name] is a 47 year old female pt admitted on voluntary basis. On admission, she does endorse that she drank the concoction she make in a suicidal attempt. She reports that she regrets the decision she made and reports that she is feeling better and denies any SI and is able to contract for safety on the unit. She reports that she did use cocaine this past Friday but reports that was an isolated thing and denies any other substance abuse issues. She reports that she goes to mental health for medication management and reports that she takes all her medications as prescribed. She reports that when she gets discharged she plans to go and stay with her daughter and reports that she is a good support person. Latana was escorted to the unit, oriented to the milieu and safety maintained.

## 2017-12-24 NOTE — ED Triage Notes (Signed)
Pt having suicidal thoughts.  Drank a quarter of a cup of Clorox followed by water and vinegar.  Had vomiting pta and is c/o abd pain.

## 2017-12-24 NOTE — Progress Notes (Signed)
Pt accepted to [facility], room 300-1 Elmarie Shiley, NP is the accepting provider.   Dr. Parke Poisson is the attending provider.  Call report to [phone]   [facility] has notified [facility] ED RN.    Pt is voluntary and can be transported by [facility].  Pt is scheduled to arrive to [facility] between 630pm and [place], [place], [place] Disposition CSW [facility]/TTS [phone]

## 2017-12-24 NOTE — BH Assessment (Signed)
Tele Assessment Note   Patient Name: Kathryn [name] MRN: [id number] Referring Physician: Vanita Panda Location of Patient: [facility] Location of Provider: [place] Department  ANGELEAH LABRAKE is a 47 y.o. female, in ED due to drinking apple cider vinegar mixed with chlorox in an attempt to kill herself. Pt reports that she has never had a suicide attempt before nor has she ever had SI. Pt reports that a lot has happened in her life recently, to include the recent deaths of her fiance, nephew and niece. Pt reports that she also relapsed on crack and her son, who pays her bills, basically called her and cussed her out, threatening to not pay any of her bills anymore and to not deal with her anymore. He also threatened to take his child away from her-as she is the caregiver currently. Pt reports that she was so heartbroken that she just decided to drink this concoction and kill herself. Pt appears remorseful and ashamed of what she did, but also remains very tearful when discussing her son and how he talked to her. Pt denies HI or AVH.   Case staffed with Elmarie Shiley, [place]. Pt is recommended for IP treatment. She is accepted to [facility] for admission later this evening.   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 Asthma   . Chronic back pain   . Hypertension   . Neuropathy     Past Surgical History:  Procedure Laterality Date  . TUBAL LIGATION      Family History:  Family History  Problem Relation Age of Onset  . Migraines Neg Hx     Social History:  reports that she has quit smoking. Her smoking use included cigarettes. She has never used smokeless tobacco. She reports that she drinks alcohol. She reports that she has current or past drug history. Drug: Cocaine.  Additional Social History:  Alcohol / Drug Use Pain Medications: see MAR Prescriptions: see  MAR Over the Counter: see MAR History of alcohol / drug use?: Yes Substance #1 Name of Substance 1: crack 1 - Frequency: sporadically 1 - Duration: ongoing 1 - Last Use / Amount: pt reports 12/21/17  CIWA: CIWA-Ar BP: (!) 143/101 Pulse Rate: 89 COWS:    Allergies:  Allergies  Allergen Reactions  . Clonopin [Clonazepam] Anaphylaxis    Swelling of the tongue and mouth.   . Fish Allergy Anaphylaxis, Shortness Of Breath and Swelling  . Flexeril [Cyclobenzaprine Hcl] Shortness Of Breath  . Ibuprofen Anaphylaxis and Hives  . Ace Inhibitors Cough  . Tramadol Nausea And Vomiting    Upset stomach    Home Medications:  (Not in a hospital admission)  OB/GYN Status:  Patient's last menstrual period was 11/26/2017.  General Assessment Data Location of Assessment: [facility] TTS Assessment: In system Is this a Tele or Face-to-Face Assessment?: Tele Assessment Is this an Initial Assessment or a Re-assessment for this encounter?: Initial Assessment Patient Accompanied by:: N/A Language Other than English: No Living Arrangements: Other (Comment) What gender do you identify as?: Female Marital status: Single Pregnancy Status: No Living Arrangements: Other relatives Can pt return to current living arrangement?: Yes Admission Status: Voluntary Is patient capable of signing voluntary admission?: Yes Referral Source: Self/Family/Friend     Crisis Care Plan Living Arrangements: Other relatives Name of Psychiatrist: [facility] Name of Therapist: [facility]  Education Status Is patient currently in school?: No Is the  patient employed, unemployed or receiving disability?: Unemployed  Risk to self with the past 6 months Suicidal Ideation: No-Not Currently/Within Last 6 Months Has patient been a risk to self within the past 6 months prior to admission? : Yes Suicidal Intent: No-Not Currently/Within Last 6 Months Has patient had any suicidal intent within the past 6 months prior to admission? :  Yes Is patient at risk for suicide?: Yes Suicidal Plan?: No-Not Currently/Within Last 6 Months Has patient had any suicidal plan within the past 6 months prior to admission? : Yes Access to Means: Yes Specify Access to Suicidal Means: bleach Previous Attempts/Gestures: No Intentional Self Injurious Behavior: None Family Suicide History: No Recent stressful life event(s): Loss (Comment), Financial Problems Persecutory voices/beliefs?: No Depression: Yes Depression Symptoms: Tearfulness, Guilt, Feeling worthless/self pity Substance abuse history and/or treatment for substance abuse?: Yes Suicide prevention information given to non-admitted patients: Not applicable  Risk to Others within the past 6 months Homicidal Ideation: No Does patient have any lifetime risk of violence toward others beyond the six months prior to admission? : Unknown Thoughts of Harm to Others: No Current Homicidal Intent: No Current Homicidal Plan: No Access to Homicidal Means: No History of harm to others?: No Assessment of Violence: None Noted Does patient have access to weapons?: No Criminal Charges Pending?: Yes Describe Pending Criminal Charges: Misdemeanor larceny Does patient have a court date: Yes Court Date: 02/28/18 Is patient on probation?: Yes  Psychosis Hallucinations: None noted Delusions: None noted  Mental Status Report Appearance/Hygiene: Unremarkable Eye Contact: Good Motor Activity: Unremarkable Speech: Logical/coherent Level of Consciousness: Alert Mood: Depressed, Ashamed/humiliated Affect: Appropriate to circumstance Anxiety Level: Minimal Thought Processes: Coherent, Relevant Judgement: Partial Orientation: Person, Place, Time, Situation Obsessive Compulsive Thoughts/Behaviors: None  Cognitive Functioning Concentration: Fair Memory: Recent Impaired Is patient IDD: No Insight: Fair Impulse Control: Fair Appetite: Fair Have you had any weight changes? : No  Change Sleep: No Change Vegetative Symptoms: None  ADLScreening [facility] Assessment Services) Patient's cognitive ability adequate to safely complete daily activities?: Yes Patient able to express need for assistance with ADLs?: Yes Independently performs ADLs?: Yes (appropriate for developmental age)  Prior Inpatient Therapy Prior Inpatient Therapy: Yes Prior Therapy Dates: several years ago Prior Therapy Facilty/Provider(s): [facility] (4 times) Reason for Treatment: paranoid schizophrenia; bipolar; drug abuse  Prior Outpatient Therapy Prior Outpatient Therapy: No Does patient have an ACCT team?: No Does patient have Intensive In-House Services?  : No Does patient have [facility] services? : No Does patient have P4CC services?: No  ADL Screening (condition at time of admission) Patient's cognitive ability adequate to safely complete daily activities?: Yes Is the patient deaf or have difficulty hearing?: No Does the patient have difficulty seeing, even when wearing glasses/contacts?: No Does the patient have difficulty concentrating, remembering, or making decisions?: No Patient able to express need for assistance with ADLs?: Yes Does the patient have difficulty dressing or bathing?: No Independently performs ADLs?: Yes (appropriate for developmental age)       Abuse/Neglect Assessment (Assessment to be complete while patient is alone) Abuse/Neglect Assessment Can Be Completed: Yes Physical Abuse: Denies Verbal Abuse: Denies Sexual Abuse: Denies Exploitation of patient/patient's resources: Denies Self-Neglect: Denies     Regulatory affairs officer (For Healthcare) Does Patient Have a Medical Advance Directive?: No          Disposition:  Disposition Initial Assessment Completed for this Encounter: Yes  This service was provided via telemedicine using a 2-way, interactive audio and video technology.  Names of all persons  participating in this telemedicine service and their role  in this encounter.   Rexene Edison 12/24/2017 5:23 PM

## 2017-12-25 DIAGNOSIS — F332 Major depressive disorder, recurrent severe without psychotic features: Principal | ICD-10-CM

## 2017-12-25 MED ORDER — LORATADINE 10 MG PO TABS
10.0000 mg | ORAL_TABLET | Freq: Every day | ORAL | Status: DC
  Administered 2017-12-25 – 2017-12-28 (×4): 10 mg via ORAL
  Filled 2017-12-25 (×6): qty 1

## 2017-12-25 MED ORDER — ALBUTEROL SULFATE HFA 108 (90 BASE) MCG/ACT IN AERS
INHALATION_SPRAY | RESPIRATORY_TRACT | Status: AC
  Filled 2017-12-25: qty 6.7

## 2017-12-25 MED ORDER — DICLOFENAC SODIUM 1 % TD GEL
2.0000 g | Freq: Four times a day (QID) | TRANSDERMAL | Status: DC
  Administered 2017-12-25 – 2017-12-27 (×5): 2 g via TOPICAL
  Filled 2017-12-25 (×2): qty 100

## 2017-12-25 MED ORDER — ALBUTEROL SULFATE HFA 108 (90 BASE) MCG/ACT IN AERS
2.0000 | INHALATION_SPRAY | Freq: Four times a day (QID) | RESPIRATORY_TRACT | Status: DC | PRN
  Administered 2017-12-25 – 2017-12-27 (×4): 2 via RESPIRATORY_TRACT
  Filled 2017-12-25: qty 6.7

## 2017-12-25 MED ORDER — TRAZODONE HCL 50 MG PO TABS
50.0000 mg | ORAL_TABLET | Freq: Every day | ORAL | Status: DC
  Administered 2017-12-25 – 2017-12-27 (×3): 50 mg via ORAL
  Filled 2017-12-25 (×4): qty 1
  Filled 2017-12-25: qty 7

## 2017-12-25 MED ORDER — DULOXETINE HCL 60 MG PO CPEP
60.0000 mg | ORAL_CAPSULE | Freq: Every day | ORAL | Status: DC
  Administered 2017-12-26 – 2017-12-28 (×3): 60 mg via ORAL
  Filled 2017-12-25 (×4): qty 1
  Filled 2017-12-25: qty 7

## 2017-12-25 NOTE — Progress Notes (Signed)
Patient ID: Kathryn [name], female   DOB: 1970/08/26, 47 y.o.   MRN: [id number] D: Patient observed talking on the phone on approach. Pt presented with depressed mood and flat affect. Pt reports she wants to get well and get back to her grandchildren. Pt attended evening AA group and engaged in discussions. Denies  SI/HI/AVH and pain.No behavioral issues noted.  A: Support and encouragement offered as needed. Medications administered as prescribed.  R: Patient is safe and cooperative on unit. Will continue to monitor  for safety and stability.

## 2017-12-25 NOTE — Progress Notes (Addendum)
[facility] Group Notes:  (Nursing/MHT/Case Management/Adjunct)  Date:  12/25/2017  Time:  2045  Type of Therapy:  wrap up group  Participation Level:  Active  Participation Quality:  Appropriate, Attentive, Sharing and Supportive  Affect:  Depressed  Cognitive:  Appropriate  Insight:  Improving  Engagement in Group:  Engaged  Modes of Intervention:  Clarification, Education and Support  Summary of Progress/Problems: Pt was newly admitted to unit and joined group. Pt shared that it is positive her being here. If pt could change any thing it would be person, places, and things. Pt is grateful for her family and her 48 grandchildren. Pt made this writer aware that she was going to her room after group because she knew another pt on the hall with whom they didn't get along and she didn't want any trouble. Nurse and [facility] notified about their relationship.   Winfield Rast S 12/25/2017, 3:54 AM
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
## 2017-12-25 NOTE — Progress Notes (Signed)
Pt ordered trazodone 50 mg BID. Per Dr. Mallie Darting, writer may modify order to trazodone 50 mg, daily at bedtime.

## 2017-12-25 NOTE — Progress Notes (Signed)
Adult Psychoeducational Group Note  Date:  12/25/2017 Time: 1600  Group Topic/Focus:  Coping With Mental Health Crisis:   The purpose of this group is to help patients identify strategies for coping with mental health crisis.  Group discusses possible causes of crisis and ways to manage them effectively.  Participation Level: Did not attend.

## 2017-12-25 NOTE — H&P (Signed)
Psychiatric Admission Assessment Adult  Patient Identification: Kathryn [name] MRN:  [id number] Date of Evaluation:  12/25/2017 Chief Complaint:  MDD Principal Diagnosis: <principal problem not specified> Diagnosis:   Patient Active Problem List   Diagnosis Date Noted  . MDD (major depressive disorder), recurrent episode, severe ([place]) [F33.2] 12/24/2017  . Lumbar back pain [M54.5] 05/11/2017  . Depression, major, single episode, moderate ([place]) [F32.1] 05/11/2017  . Low vitamin B12 level [E53.8] 05/11/2017  . Immunization due [Z23] 05/11/2017  . Hypertension goal BP (blood pressure) < 140/90 [I10] 01/11/2017   History of Present Illness: Patient is seen and examined.  Patient is a 47 year old female who presented to the [facility] emergency department with suicidal ideation.  The patient stated that she was drinking apple cider vinegar mixed with Clorox and attempt to kill her self.  She stated she had never had a suicide attempt before.  She stated she had not had suicidal ideation before.  She stated that she has had several difficulties in her life.  She noted the deaths of her fianc, nephew and niece.  She also reported that she had relapsed on crack cocaine, and her son who usually pays her bills had now decided to refuse to pay them.  She had gotten into an argument with her son, and he had threatened to take his child away from her.  She was the primary caregiver of the child most currently.  She stated that she was so heartbroken over this that she decided to drink a concoction and kill her self.  She has a long history of cocaine dependence.  She has been in drug treatment on 4 occasions.  Her last was in 2005.  She stated she was sober for approximately 6 months after that.  She most recently had worked at [employer] in 2019, but was unable to continue to work there because of some orthopedic injuries.  [facility].  She is currently prescribed  Celexa, Cymbalta.  She also is on gabapentin for chronic pain.  She denied any suicidal ideation currently.  She admitted to the neurovegetative symptoms of depression including helplessness, hopelessness and worthlessness.  Associated Signs/Symptoms: Depression Symptoms:  depressed mood, anhedonia, insomnia, psychomotor retardation, fatigue, feelings of worthlessness/guilt, difficulty concentrating, hopelessness, suicidal thoughts without plan, suicidal attempt, anxiety, disturbed sleep, (Hypo) Manic Symptoms:  Impulsivity, Irritable Mood, Anxiety Symptoms:  Excessive Worry, Psychotic Symptoms:  Denied PTSD Symptoms: Negative Total Time spent with patient: 30 minutes  Past Psychiatric History: Patient stated that she had had no previous suicide attempts.  She stated she had been in substance abuse rehabilitation programs 4 times.  Her last was in 2005.  [facility].  Is the patient at risk to self? Yes.    Has the patient been a risk to self in the past 6 months? Yes.    Has the patient been a risk to self within the distant past? No.  Is the patient a risk to others? No.  Has the patient been a risk to others in the past 6 months? No.  Has the patient been a risk to others within the distant past? No.   Prior Inpatient Therapy:   Prior Outpatient Therapy:    Alcohol Screening: 1. How often do you have a drink containing alcohol?: 2 to 4 times a month 2. How many drinks containing alcohol do you have on a typical day when you are drinking?:  1 or 2 3. How often do you have six or more drinks on one occasion?: Less than monthly AUDIT-C Score: 3 4. How often during the last year have you found that you were not able to stop drinking once you had started?: Never 5. How often during the last year have you failed to do what was normally expected from you becasue of drinking?: Never 6. How often during the last year have you needed a first drink in the  morning to get yourself going after a heavy drinking session?: Never 7. How often during the last year have you had a feeling of guilt of remorse after drinking?: Never 8. How often during the last year have you been unable to remember what happened the night before because you had been drinking?: Never 9. Have you or someone else been injured as a result of your drinking?: No 10. Has a relative or friend or a doctor or another health worker been concerned about your drinking or suggested you cut down?: No Alcohol Use Disorder Identification Test Final Score (AUDIT): 3 Intervention/Follow-up: AUDIT Score <7 follow-up not indicated Substance Abuse History in the last 12 months:  Yes.   Consequences of Substance Abuse: Family Consequences:  Patient stated that her son had cut off paying her bills, and it also been trying to make sure that he had custody of his child that she cared for. Previous Psychotropic Medications: Yes  Psychological Evaluations: Yes  Past Medical History:  Past Medical History:  Diagnosis Date  . Asthma   . Chronic back pain   . Hypertension   . Neuropathy     Past Surgical History:  Procedure Laterality Date  . TUBAL LIGATION     Family History:  Family History  Problem Relation Age of Onset  . Migraines Neg Hx    Family Psychiatric  History: Denied Tobacco Screening: Have you used any form of tobacco in the last 30 days? (Cigarettes, Smokeless Tobacco, Cigars, and/or Pipes): No Social History:  Social History   Substance and Sexual Activity  Alcohol Use Yes   Comment: occ     Social History   Substance and Sexual Activity  Drug Use Yes  . Types: Cocaine   Comment: crack    Additional Social History:                           Allergies:   Allergies  Allergen Reactions  . Clonopin [Clonazepam] Anaphylaxis    Swelling of the tongue and mouth.   . Fish Allergy Anaphylaxis, Shortness Of Breath and Swelling  . Flexeril [Cyclobenzaprine  Hcl] Shortness Of Breath  . Ibuprofen Anaphylaxis and Hives  . Ace Inhibitors Cough  . Tramadol Nausea And Vomiting    Upset stomach   Lab Results:  Results for orders placed or performed during the hospital encounter of 12/24/17 (from the past 48 hour(s))  Comprehensive metabolic panel     Status: Abnormal   Collection Time: 12/24/17  1:53 PM  Result Value Ref Range   Sodium 137 135 - 145 mmol/L   Potassium 3.9 3.5 - 5.1 mmol/L   Chloride 106 98 - 111 mmol/L   CO2 18 (L) 22 - 32 mmol/L   Glucose, Bld 86 70 - 99 mg/dL   BUN 12 6 - 20 mg/dL   Creatinine, Ser 0.78 0.44 - 1.00 mg/dL   Calcium 9.6 8.9 - 10.3 mg/dL   Total Protein 8.7 (H) 6.5 - 8.1  g/dL   Albumin 4.7 3.5 - 5.0 g/dL   AST 28 15 - 41 U/L   ALT 18 0 - 44 U/L   Alkaline Phosphatase 58 38 - 126 U/L   Total Bilirubin 0.5 0.3 - 1.2 mg/dL   GFR calc non Af Amer >60 >60 mL/min   GFR calc Af Amer >60 >60 mL/min    Comment: (NOTE) The eGFR has been calculated using the CKD EPI equation. This calculation has not been validated in all clinical situations. eGFR's persistently <60 mL/min signify possible Chronic Kidney Disease.    Anion gap 13 5 - 15    Comment: Performed at [facility], [address], [place], [place] [address]  Lipase, blood     Status: None   Collection Time: 12/24/17  1:53 PM  Result Value Ref Range   Lipase 29 11 - 51 U/L    Comment: Performed at [facility], [address] [place], [place] [address]  CBC with Differential     Status: Abnormal   Collection Time: 12/24/17  1:53 PM  Result Value Ref Range   WBC 12.3 (H) 4.0 - 10.5 K/uL   RBC 4.51 3.87 - 5.11 MIL/uL   Hemoglobin 14.3 12.0 - 15.0 g/dL   HCT 41.5 36.0 - 46.0 %   MCV 92.0 78.0 - 100.0 fL   MCH 31.7 26.0 - 34.0 pg   MCHC 34.5 30.0 - 36.0 g/dL   RDW 13.0 11.5 - 15.5 %   Platelets 540 (H) 150 - 400 K/uL   Neutrophils Relative % 60 %   Neutro Abs 7.4 1.7 - 7.7 K/uL   Lymphocytes Relative 34 %   Lymphs Abs 4.2 (H) 0.7 - 4.0 K/uL    Monocytes Relative 5 %   Monocytes Absolute 0.6 0.1 - 1.0 K/uL   Eosinophils Relative 1 %   Eosinophils Absolute 0.1 0.0 - 0.7 K/uL   Basophils Relative 0 %   Basophils Absolute 0.0 0.0 - 0.1 K/uL    Comment: Performed at [facility], [address] [place], [place] [address]  Ethanol     Status: None   Collection Time: 12/24/17  1:53 PM  Result Value Ref Range   Alcohol, Ethyl (B) <10 <10 mg/dL    Comment: (NOTE) Lowest detectable limit for serum alcohol is 10 mg/dL. For medical purposes only. Performed at [facility], [address], [place], [place] [address]   Urinalysis, Routine w reflex microscopic     Status: Abnormal   Collection Time: 12/24/17  3:58 PM  Result Value Ref Range   Color, Urine YELLOW YELLOW   APPearance HAZY (A) CLEAR   Specific Gravity, Urine 1.017 1.005 - 1.030   pH 6.0 5.0 - 8.0   Glucose, UA NEGATIVE NEGATIVE mg/dL   Hgb urine dipstick NEGATIVE NEGATIVE   Bilirubin Urine NEGATIVE NEGATIVE   Ketones, ur NEGATIVE NEGATIVE mg/dL   Protein, ur NEGATIVE NEGATIVE mg/dL   Nitrite NEGATIVE NEGATIVE   Leukocytes, UA NEGATIVE NEGATIVE    Comment: Performed at [facility], [address] [place], [place] [address]  Urine rapid drug screen (hosp performed)     Status: Abnormal   Collection Time: 12/24/17  3:58 PM  Result Value Ref Range   Opiates NONE DETECTED NONE DETECTED   Cocaine POSITIVE (A) NONE DETECTED   Benzodiazepines NONE DETECTED NONE DETECTED   Amphetamines NONE DETECTED NONE DETECTED   Tetrahydrocannabinol NONE DETECTED NONE DETECTED   Barbiturates NONE DETECTED NONE DETECTED    Comment: (NOTE)  DRUG SCREEN FOR MEDICAL PURPOSES ONLY.  IF CONFIRMATION IS NEEDED FOR ANY PURPOSE, NOTIFY LAB WITHIN 5 DAYS. LOWEST DETECTABLE LIMITS FOR URINE DRUG SCREEN Drug Class                     Cutoff (ng/mL) Amphetamine and metabolites    1000 Barbiturate and metabolites    200 Benzodiazepine                 250 Tricyclics and metabolites      300 Opiates and metabolites        300 Cocaine and metabolites        300 THC                            50 Performed at [facility], [address], [place], [place] [address]     Blood Alcohol level:  Lab Results  Component Value Date   [facility] <10 88/89/1694    Metabolic Disorder Labs:  Lab Results  Component Value Date   HGBA1C 5.4 04/20/2017   MPG 108 04/20/2017   No results found for: PROLACTIN Lab Results  Component Value Date   CHOL 155 04/20/2017   TRIG 46 04/20/2017   HDL 70 04/20/2017   CHOLHDL 2.2 04/20/2017   LDLCALC 72 04/20/2017    Current Medications: Current Facility-Administered Medications  Medication Dose Route Frequency Provider Last Rate Last Dose  . acetaminophen (TYLENOL) tablet 650 mg  650 mg Oral Q6H PRN Elmarie Shiley A, NP   650 mg at 12/25/17 0806  . albuterol (PROVENTIL HFA;VENTOLIN HFA) 108 (90 Base) MCG/ACT inhaler 2 puff  2 puff Inhalation Q6H PRN Sharma Covert, MD   2 puff at 12/25/17 0944  . alum & mag hydroxide-simeth (MAALOX/MYLANTA) 200-200-20 MG/5ML suspension 30 mL  30 mL Oral Q4H PRN Elmarie Shiley A, NP      . diclofenac sodium (VOLTAREN) 1 % transdermal gel 2 g  2 g Topical QID Sharma Covert, MD   2 g at 12/25/17 1210  . [START ON 12/26/2017] DULoxetine (CYMBALTA) DR capsule 60 mg  60 mg Oral Daily Sharma Covert, MD      . gabapentin (NEURONTIN) capsule 300 mg  300 mg Oral BID Elmarie Shiley A, NP   300 mg at 12/25/17 0807  . hydrochlorothiazide (HYDRODIURIL) tablet 12.5 mg  12.5 mg Oral Daily Elmarie Shiley A, NP   12.5 mg at 12/25/17 0807  . loratadine (CLARITIN) tablet 10 mg  10 mg Oral Daily Cobos, Myer Peer, MD   10 mg at 12/25/17 0807  . losartan (COZAAR) tablet 25 mg  25 mg Oral BID Elmarie Shiley A, NP   25 mg at 12/25/17 5038  . magnesium hydroxide (MILK OF MAGNESIA) suspension 30 mL  30 mL Oral Daily PRN Elmarie Shiley A, NP      . sucralfate (CARAFATE) 1 GM/10ML suspension 1 g  1 g Oral TID WC & HS Niel Hummer, NP   1  g at 12/25/17 1209  . traZODone (DESYREL) tablet 50 mg  50 mg Oral QHS Sharma Covert, MD       PTA Medications: Medications Prior to Admission  Medication Sig Dispense Refill Last Dose  . citalopram (CELEXA) 20 MG tablet Take 10 mg by mouth 2 (two) times daily.   12/22/2017  . DULoxetine (CYMBALTA) 30 MG capsule Take 30 mg by mouth daily.   12/22/2017  . gabapentin (  NEURONTIN) 300 MG capsule Take 300 mg by mouth 2 (two) times daily.   12/22/2017 at Unknown time  . hydrochlorothiazide (MICROZIDE) 12.5 MG capsule Take 12.5 mg by mouth daily.   12/22/2017  . loratadine (CLARITIN) 10 MG tablet Take 5 mg by mouth 2 (two) times daily.   12/22/2017 at Unknown time  . losartan (COZAAR) 50 MG tablet Take 25 mg by mouth 2 (two) times daily.   12/22/2017  . traZODone (DESYREL) 100 MG tablet Take 50 mg by mouth 2 (two) times daily.   12/22/2017 at Unknown time    Musculoskeletal: Strength & Muscle Tone: abnormal Gait & Station: unsteady Patient leans: N/A  Psychiatric Specialty Exam: Physical Exam  Nursing note and vitals reviewed. Constitutional: She is oriented to person, place, and time. She appears well-developed and well-nourished.  HENT:  Head: Normocephalic and atraumatic.  Respiratory: Effort normal.  Neurological: She is alert and oriented to person, place, and time.    ROS  Blood pressure 113/78, pulse 90, temperature 99.5 F (37.5 C), resp. rate 16, height '4\' 11"'  (1.499 m), weight 70.8 kg, last menstrual period 11/26/2017.Body mass index is 31.51 kg/m.  General Appearance: Disheveled  Eye Contact:  Fair  Speech:  Normal Rate  Volume:  Normal  Mood:  Anxious  Affect:  Congruent  Thought Process:  Coherent and Descriptions of Associations: Intact  Orientation:  Full (Time, Place, and Person)  Thought Content:  Logical  Suicidal Thoughts:  Yes.  without intent/plan  Homicidal Thoughts:  No  Memory:  Immediate;   Fair Recent;   Fair Remote;   Fair  Judgement:  Impaired  Insight:   Lacking  Psychomotor Activity:  Increased  Concentration:  Concentration: Fair and Attention Span: Fair  Recall:  [employer] of Knowledge:  Fair  Language:  Good  Akathisia:  Negative  Handed:  Right  AIMS (if indicated):     Assets:  Communication Skills Desire for Improvement Housing Physical Health Resilience Social Support  ADL's:  Intact  Cognition:  WNL  Sleep:  Number of Hours: 5    Treatment Plan Summary: Daily contact with patient to assess and evaluate symptoms and progress in treatment, Medication management and Plan : Patient is seen and examined.  Patient is a 47 year old female with the above-stated past psychiatric history who was admitted after developing suicidal ideation. 1.  Major depression-stop Celexa, increase Cymbalta to 40 mg p.o. daily  2.  Chronic pain-increase gabapentin to 300 mg p.o. twice daily  3.  Cocaine dependence-discussed possible substance abuse treatment programs  4.  Hypertension-continue hydrochlorothiazide and losartan  5.  Arthritis-x-rays of hip show bony abnormality, x-rays of knees showed an effusion.  She has a history of gastric issues being on  Carafate, so will have to avoid oral nonsteroidal anti-inflammatories.  I will write for Voltaren gel for her hip as well as her knees.  6. continue Claritin  7.  Discharge planning Observation Level/Precautions:  15 minute checks  Laboratory:  Chemistry Profile  Psychotherapy:    Medications:    Consultations:    Discharge Concerns:    Estimated LOS:  Other:     Physician Treatment Plan for Primary Diagnosis: <principal problem not specified> Long Term Goal(s): Improvement in symptoms so as ready for discharge  Short Term Goals: Ability to identify changes in lifestyle to reduce recurrence of condition will improve, Ability to verbalize feelings will improve, Ability to disclose and discuss suicidal ideas, Ability to demonstrate self-control will improve, Ability to  identify  and develop effective coping behaviors will improve, Ability to maintain clinical measurements within normal limits will improve, Compliance with prescribed medications will improve and Ability to identify triggers associated with substance abuse/mental health issues will improve  Physician Treatment Plan for Secondary Diagnosis: Active Problems:   MDD (major depressive disorder), recurrent episode, severe ([place])  Long Term Goal(s): Improvement in symptoms so as ready for discharge  Short Term Goals: Ability to identify changes in lifestyle to reduce recurrence of condition will improve, Ability to verbalize feelings will improve, Ability to disclose and discuss suicidal ideas, Ability to demonstrate self-control will improve, Ability to identify and develop effective coping behaviors will improve, Ability to maintain clinical measurements within normal limits will improve, Compliance with prescribed medications will improve and Ability to identify triggers associated with substance abuse/mental health issues will improve  I certify that inpatient services furnished can reasonably be expected to improve the patient's condition.    Sharma Covert, MD 9/10/201912:55 PM
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
## 2017-12-25 NOTE — BHH Suicide Risk Assessment (Signed)
[facility] Admission Suicide Risk Assessment   Nursing information obtained from:  Patient Demographic factors:  Low socioeconomic status, Unemployed Current Mental Status:  Suicidal ideation indicated by patient, Self-harm thoughts, Self-harm behaviors Loss Factors:  Loss of significant relationship, Financial problems / change in socioeconomic status Historical Factors:  Prior suicide attempts, Family history of mental illness or substance abuse Risk Reduction Factors:  Living with another person, especially a relative, Positive coping skills or problem solving skills  Total Time spent with patient: 30 minutes Principal Problem: <principal problem not specified> Diagnosis:   Patient Active Problem List   Diagnosis Date Noted  . MDD (major depressive disorder), recurrent episode, severe ([place]) [F33.2] 12/24/2017  . Lumbar back pain [M54.5] 05/11/2017  . Depression, major, single episode, moderate ([place]) [F32.1] 05/11/2017  . Low vitamin B12 level [E53.8] 05/11/2017  . Immunization due [Z23] 05/11/2017  . Hypertension goal BP (blood pressure) < 140/90 [I10] 01/11/2017   Subjective Data: Patient is seen and examined.  Patient is a 47 year old female who presented to the [facility] emergency department with suicidal ideation.  The patient stated that she was drinking apple cider vinegar mixed with Clorox and attempt to kill her self.  She stated she had never had a suicide attempt before.  She stated she had not had suicidal ideation before.  She stated that she has had several difficulties in her life.  She noted the deaths of her fianc, nephew and niece.  She also reported that she had relapsed on crack cocaine, and her son who usually pays her bills had now decided to refuse to pay them.  She had gotten into an argument with her son, and he had threatened to take his child away from her.  She was the primary caregiver of the child most currently.  She stated that she was so heartbroken over this that  she decided to drink a concoction and kill her self.  She has a long history of cocaine dependence.  She has been in drug treatment on 4 occasions.  Her last was in 2005.  She stated she was sober for approximately 6 months after that.  She most recently had worked at [employer] in 2019, but was unable to continue to work there because of some orthopedic injuries.  [facility] and [facility].  She is currently prescribed Celexa, Cymbalta.  She also is on gabapentin for chronic pain.  She denied any suicidal ideation currently.  She admitted to the neurovegetative symptoms of depression including helplessness, hopelessness and worthlessness.  Continued Clinical Symptoms:  Alcohol Use Disorder Identification Test Final Score (AUDIT): 3 The "Alcohol Use Disorders Identification Test", Guidelines for Use in Primary Care, Second Edition.  World Pharmacologist [place] Day). Score between 0-7:  no or low risk or alcohol related problems. Score between 8-15:  moderate risk of alcohol related problems. Score between 16-19:  high risk of alcohol related problems. Score 20 or above:  warrants further diagnostic evaluation for alcohol dependence and treatment.   CLINICAL FACTORS:   Depression:   Anhedonia Comorbid alcohol abuse/dependence Hopelessness Impulsivity Insomnia Alcohol/Substance Abuse/Dependencies   Musculoskeletal: Strength & Muscle Tone: abnormal Gait & Station: unsteady Patient leans: N/A  Psychiatric Specialty Exam: Physical Exam  Nursing note and vitals reviewed. Constitutional: She is oriented to person, place, and time. She appears well-developed and well-nourished.  HENT:  Head: Normocephalic and atraumatic.  Respiratory: Effort normal.  Neurological: She is alert and oriented to person, place, and time.  ROS  Blood pressure 113/78, pulse 90, temperature 99.5 F (37.5 C), resp. rate 16, height 4\' 11"  (1.499 m), weight 70.8 kg, last menstrual  period 11/26/2017.Body mass index is 31.51 kg/m.  General Appearance: Casual  Eye Contact:  Fair  Speech:  Normal Rate  Volume:  Normal  Mood:  Anxious and Depressed  Affect:  Congruent  Thought Process:  Coherent and Descriptions of Associations: Intact  Orientation:  Full (Time, Place, and Person)  Thought Content:  Logical  Suicidal Thoughts:  Yes.  without intent/plan  Homicidal Thoughts:  No  Memory:  Immediate;   Fair Recent;   Fair Remote;   Fair  Judgement:  Impaired  Insight:  Fair  Psychomotor Activity:  Increased  Concentration:  Concentration: Fair and Attention Span: Fair  Recall:  [employer] of Knowledge:  Fair  Language:  Fair  Akathisia:  Negative  Handed:  Right  AIMS (if indicated):     Assets:  Communication Skills Desire for Improvement Housing Resilience  ADL's:  Intact  Cognition:  WNL  Sleep:  Number of Hours: 5      COGNITIVE FEATURES THAT CONTRIBUTE TO RISK:  None    SUICIDE RISK:   Mild:  Suicidal ideation of limited frequency, intensity, duration, and specificity.  There are no identifiable plans, no associated intent, mild dysphoria and related symptoms, good self-control (both objective and subjective assessment), few other risk factors, and identifiable protective factors, including available and accessible social support.  PLAN OF CARE: Patient is seen and examined.  Patient is a 47 year old female with the above-stated past psychiatric history who was admitted after developing suicidal ideation.  1.  Major depression-stop Celexa, increase Cymbalta to 40 mg p.o. daily  2.  Chronic pain-increase gabapentin to 300 mg p.o. twice daily  3.  Cocaine dependence-discussed possible substance abuse treatment programs  4.  Hypertension-continue hydrochlorothiazide and losartan  5.  Arthritis-x-rays of hip show bony abnormality, x-rays of knees showed an effusion.  She has a history of gastric issues being on  Carafate, so will have to avoid  oral nonsteroidal anti-inflammatories.  I will write for Voltaren gel for her hip as well as her knees.  6. continue Claritin  7.  Discharge planning  I certify that inpatient services furnished can reasonably be expected to improve the patient's condition.   Sharma Covert, MD 12/25/2017, 10:43 AM

## 2017-12-25 NOTE — BHH Group Notes (Signed)
[facility] Group Therapy 12/25/2017 1:15pm  Type of Therapy: Mental Health Association Presentation  Participation Level: Active  Participation Quality: Attentive  Affect: Appropriate  Cognitive: Oriented  Insight: Developing/Improving  Engagement in Therapy: Engaged  Modes of Intervention: Discussion, Education and Socialization  Summary of Progress/Problems: [place] ([place] Speaker came to talk about his personal journey with mental health. The pt processed ways by which to relate to the speaker. [place] speaker provided handouts and educational information pertaining to groups and services offered by the [facility]. Pt was engaged in speaker's presentation and was receptive to resources provided.    Avelina Laine, LCSW 12/25/2017 1:46 PM

## 2017-12-25 NOTE — BHH Suicide Risk Assessment (Signed)
[place] INPATIENT:  Family/Significant Other Suicide Prevention Education  Suicide Prevention Education:  Education Completed; Avarae Zwart (pt's daughter) [phone] has been identified by the patient as the family member/significant other with whom the patient will be residing, and identified as the person(s) who will aid the patient in the event of a mental health crisis (suicidal ideations/suicide attempt).  With written consent from the patient, the family member/significant other has been provided the following suicide prevention education, prior to the and/or following the discharge of the patient.  The suicide prevention education provided includes the following:  Suicide risk factors  Suicide prevention and interventions  National Suicide Hotline telephone number  [facility] assessment telephone number  [facility] Emergency Assistance [place] and/or Residential Mobile Crisis Unit telephone number  Request made of family/significant other to:  Remove weapons (e.g., guns, rifles, knives), all items previously/currently identified as safety concern.    Remove drugs/medications (over-the-counter, prescriptions, illicit drugs), all items previously/currently identified as a safety concern.  The family member/significant other verbalizes understanding of the suicide prevention education information provided.  The family member/significant other agrees to remove the items of safety concern listed above.  Avelina Laine LCSW 12/25/2017, 2:19 PM

## 2017-12-26 MED ORDER — BENZONATATE 100 MG PO CAPS
100.0000 mg | ORAL_CAPSULE | Freq: Three times a day (TID) | ORAL | Status: DC | PRN
  Administered 2017-12-26 – 2017-12-28 (×5): 100 mg via ORAL
  Filled 2017-12-26 (×5): qty 1

## 2017-12-26 MED ORDER — GABAPENTIN 300 MG PO CAPS
300.0000 mg | ORAL_CAPSULE | Freq: Three times a day (TID) | ORAL | Status: DC
  Administered 2017-12-26 – 2017-12-28 (×6): 300 mg via ORAL
  Filled 2017-12-26 (×4): qty 1
  Filled 2017-12-26: qty 21
  Filled 2017-12-26 (×4): qty 1
  Filled 2017-12-26 (×2): qty 21

## 2017-12-26 NOTE — Therapy (Signed)
Occupational Therapy Group Note  Date:  12/26/2017 Time:  12:59 PM  Group Topic/Focus:  Self Esteem  Participation Level:  Active  Participation Quality:  Appropriate  Affect:  Blunted  Cognitive:  Appropriate  Insight: Improving  Engagement in Group:  Engaged  Modes of Intervention:  Activity, Discussion, Education and Socialization  Additional Comments:    S: "My main focus is my gait"  O:Education given on self esteem, its definition, and how it becomes negative vs positive. Self esteem education given on its relation to [place]. Pt encouraged to contribute in discussion and brainstorm. Self esteem activity completed where pt is to name a positive word for each letter of the alphabet (A-Z). Pt then to complete coat of arms activity to encompass roles, values, goals, and favorite qualities. Coloring encouraged within this activity. Positive affirmations worksheet given at end of session for pt to practice and continue building this skill.  A: Pt presents to group with blutned affect, engaged and participatory throughout session. Pt contributed to discussion of positive vs negative aspects of self esteem. Pt completed A-Z activity with min VC's to use positive words, eager to share with others. Pt originally using words like "unemployed and hypertension", corrected with cueing. Eager to accept positive affirmations.  P: Handouts given to facilitate carryover into community.  Zenovia Jarred, MSOT, OTR/L  [place] 12/26/2017, 12:59 PM

## 2017-12-26 NOTE — Progress Notes (Signed)
[facility] MD Progress Note  12/26/2017 1:04 PM Kathryn [name]  MRN:  [id number] Subjective: Patient is seen and examined.  Patient is a 47 year old female with a past psychiatric history significant for major depression as well as cocaine dependence.  She is seen in follow-up.  Her main complaint today is pain.  She stated the Voltaren gel is doing nothing for her.  She wants something for pain.  She was unaware of the fact that she had Tylenol available for her.  She has an allergy to nonsteroidal anti-inflammatories, and is also on Carafate for some gastric problems she has had in the past.  We discussed the fact that she would most likely not be able to take an oral non-steroidal anti-inflammatory.  She stated her mood was stable, and she was not suicidal. Principal Problem: <principal problem not specified> Diagnosis:   Patient Active Problem List   Diagnosis Date Noted  . MDD (major depressive disorder), recurrent episode, severe ([place]) [F33.2] 12/24/2017  . Lumbar back pain [M54.5] 05/11/2017  . Depression, major, single episode, moderate ([place]) [F32.1] 05/11/2017  . Low vitamin B12 level [E53.8] 05/11/2017  . Immunization due [Z23] 05/11/2017  . Hypertension goal BP (blood pressure) < 140/90 [I10] 01/11/2017   Total Time spent with patient: 15 minutes  Past Psychiatric History: See admission H&P  Past Medical History:  Past Medical History:  Diagnosis Date  . Asthma   . Chronic back pain   . Hypertension   . Neuropathy     Past Surgical History:  Procedure Laterality Date  . TUBAL LIGATION     Family History:  Family History  Problem Relation Age of Onset  . Migraines Neg Hx    Family Psychiatric  History: See admission H&P Social History:  Social History   Substance and Sexual Activity  Alcohol Use Yes   Comment: occ     Social History   Substance and Sexual Activity  Drug Use Yes  . Types: Cocaine   Comment: crack    Social History   Socioeconomic History  .  Marital status: Single    Spouse name: Not on file  . Number of children: Not on file  . Years of education: Not on file  . Highest education level: Not on file  Occupational History  . Not on file  Social Needs  . Financial resource strain: Not on file  . Food insecurity:    Worry: Not on file    Inability: Not on file  . Transportation needs:    Medical: Not on file    Non-medical: Not on file  Tobacco Use  . Smoking status: Former Smoker    Types: Cigarettes  . Smokeless tobacco: Never Used  Substance and Sexual Activity  . Alcohol use: Yes    Comment: occ  . Drug use: Yes    Types: Cocaine    Comment: crack  . Sexual activity: Yes    Birth control/protection: None  Lifestyle  . Physical activity:    Days per week: Not on file    Minutes per session: Not on file  . Stress: Not on file  Relationships  . Social connections:    Talks on phone: Not on file    Gets together: Not on file    Attends religious service: Not on file    Active member of club or organization: Not on file    Attends meetings of clubs or organizations: Not on file    Relationship status: Not on file  Other Topics Concern  . Not on file  Social History Narrative  . Not on file   Additional Social History:                         Sleep: Fair  Appetite:  Fair  Current Medications: Current Facility-Administered Medications  Medication Dose Route Frequency Provider Last Rate Last Dose  . acetaminophen (TYLENOL) tablet 650 mg  650 mg Oral Q6H PRN Elmarie Shiley A, NP   650 mg at 12/25/17 0806  . albuterol (PROVENTIL HFA;VENTOLIN HFA) 108 (90 Base) MCG/ACT inhaler 2 puff  2 puff Inhalation Q6H PRN Sharma Covert, MD   2 puff at 12/26/17 1203  . alum & mag hydroxide-simeth (MAALOX/MYLANTA) 200-200-20 MG/5ML suspension 30 mL  30 mL Oral Q4H PRN Elmarie Shiley A, NP      . diclofenac sodium (VOLTAREN) 1 % transdermal gel 2 g  2 g Topical QID Sharma Covert, MD   2 g at 12/25/17 2151   . DULoxetine (CYMBALTA) DR capsule 60 mg  60 mg Oral Daily Sharma Covert, MD   60 mg at 12/26/17 0823  . gabapentin (NEURONTIN) capsule 300 mg  300 mg Oral TID Sharma Covert, MD      . hydrochlorothiazide (HYDRODIURIL) tablet 12.5 mg  12.5 mg Oral Daily Elmarie Shiley A, NP   12.5 mg at 12/26/17 0823  . loratadine (CLARITIN) tablet 10 mg  10 mg Oral Daily Cobos, Myer Peer, MD   10 mg at 12/26/17 0823  . losartan (COZAAR) tablet 25 mg  25 mg Oral BID Elmarie Shiley A, NP   25 mg at 12/26/17 1202  . magnesium hydroxide (MILK OF MAGNESIA) suspension 30 mL  30 mL Oral Daily PRN Elmarie Shiley A, NP      . sucralfate (CARAFATE) 1 GM/10ML suspension 1 g  1 g Oral TID WC & HS Niel Hummer, NP   1 g at 12/26/17 1202  . traZODone (DESYREL) tablet 50 mg  50 mg Oral QHS Sharma Covert, MD   50 mg at 12/25/17 2151    Lab Results:  Results for orders placed or performed during the hospital encounter of 12/24/17 (from the past 48 hour(s))  Comprehensive metabolic panel     Status: Abnormal   Collection Time: 12/24/17  1:53 PM  Result Value Ref Range   Sodium 137 135 - 145 mmol/L   Potassium 3.9 3.5 - 5.1 mmol/L   Chloride 106 98 - 111 mmol/L   CO2 18 (L) 22 - 32 mmol/L   Glucose, Bld 86 70 - 99 mg/dL   BUN 12 6 - 20 mg/dL   Creatinine, Ser 0.78 0.44 - 1.00 mg/dL   Calcium 9.6 8.9 - 10.3 mg/dL   Total Protein 8.7 (H) 6.5 - 8.1 g/dL   Albumin 4.7 3.5 - 5.0 g/dL   AST 28 15 - 41 U/L   ALT 18 0 - 44 U/L   Alkaline Phosphatase 58 38 - 126 U/L   Total Bilirubin 0.5 0.3 - 1.2 mg/dL   GFR calc non Af Amer >60 >60 mL/min   GFR calc Af Amer >60 >60 mL/min    Comment: (NOTE) The eGFR has been calculated using the CKD EPI equation. This calculation has not been validated in all clinical situations. eGFR's persistently <60 mL/min signify possible Chronic Kidney Disease.    Anion gap 13 5 - 15    Comment: Performed at [facility],  [address] [place], [place] [address]  Lipase, blood      Status: None   Collection Time: 12/24/17  1:53 PM  Result Value Ref Range   Lipase 29 11 - 51 U/L    Comment: Performed at [facility], [address], [place], [place] [address]  CBC with Differential     Status: Abnormal   Collection Time: 12/24/17  1:53 PM  Result Value Ref Range   WBC 12.3 (H) 4.0 - 10.5 K/uL   RBC 4.51 3.87 - 5.11 MIL/uL   Hemoglobin 14.3 12.0 - 15.0 g/dL   HCT 41.5 36.0 - 46.0 %   MCV 92.0 78.0 - 100.0 fL   MCH 31.7 26.0 - 34.0 pg   MCHC 34.5 30.0 - 36.0 g/dL   RDW 13.0 11.5 - 15.5 %   Platelets 540 (H) 150 - 400 K/uL   Neutrophils Relative % 60 %   Neutro Abs 7.4 1.7 - 7.7 K/uL   Lymphocytes Relative 34 %   Lymphs Abs 4.2 (H) 0.7 - 4.0 K/uL   Monocytes Relative 5 %   Monocytes Absolute 0.6 0.1 - 1.0 K/uL   Eosinophils Relative 1 %   Eosinophils Absolute 0.1 0.0 - 0.7 K/uL   Basophils Relative 0 %   Basophils Absolute 0.0 0.0 - 0.1 K/uL    Comment: Performed at [facility], [address] [place], [place] [address]  Ethanol     Status: None   Collection Time: 12/24/17  1:53 PM  Result Value Ref Range   Alcohol, Ethyl (B) <10 <10 mg/dL    Comment: (NOTE) Lowest detectable limit for serum alcohol is 10 mg/dL. For medical purposes only. Performed at [facility], [address], [place], [place] [address]   Urinalysis, Routine w reflex microscopic     Status: Abnormal   Collection Time: 12/24/17  3:58 PM  Result Value Ref Range   Color, Urine YELLOW YELLOW   APPearance HAZY (A) CLEAR   Specific Gravity, Urine 1.017 1.005 - 1.030   pH 6.0 5.0 - 8.0   Glucose, UA NEGATIVE NEGATIVE mg/dL   Hgb urine dipstick NEGATIVE NEGATIVE   Bilirubin Urine NEGATIVE NEGATIVE   Ketones, ur NEGATIVE NEGATIVE mg/dL   Protein, ur NEGATIVE NEGATIVE mg/dL   Nitrite NEGATIVE NEGATIVE   Leukocytes, UA NEGATIVE NEGATIVE    Comment: Performed at [facility], [address] [place], [place] [address]  Urine rapid drug screen (hosp performed)     Status: Abnormal    Collection Time: 12/24/17  3:58 PM  Result Value Ref Range   Opiates NONE DETECTED NONE DETECTED   Cocaine POSITIVE (A) NONE DETECTED   Benzodiazepines NONE DETECTED NONE DETECTED   Amphetamines NONE DETECTED NONE DETECTED   Tetrahydrocannabinol NONE DETECTED NONE DETECTED   Barbiturates NONE DETECTED NONE DETECTED    Comment: (NOTE) DRUG SCREEN FOR MEDICAL PURPOSES ONLY.  IF CONFIRMATION IS NEEDED FOR ANY PURPOSE, NOTIFY LAB WITHIN 5 DAYS. LOWEST DETECTABLE LIMITS FOR URINE DRUG SCREEN Drug Class                     Cutoff (ng/mL) Amphetamine and metabolites    1000 Barbiturate and metabolites    200 Benzodiazepine                 119 Tricyclics and metabolites     300 Opiates and metabolites        300 Cocaine and metabolites        300 THC  47 Performed at [facility], [address] [place], [place] [address]     Blood Alcohol level:  Lab Results  Component Value Date   [facility] <10 53/74/8270    Metabolic Disorder Labs: Lab Results  Component Value Date   HGBA1C 5.4 04/20/2017   MPG 108 04/20/2017   No results found for: PROLACTIN Lab Results  Component Value Date   CHOL 155 04/20/2017   TRIG 46 04/20/2017   HDL 70 04/20/2017   CHOLHDL 2.2 04/20/2017   LDLCALC 72 04/20/2017    Physical Findings: AIMS: Facial and Oral Movements Muscles of Facial Expression: None, normal Lips and Perioral Area: None, normal Jaw: None, normal Tongue: None, normal,Extremity Movements Upper (arms, wrists, hands, fingers): None, normal Lower (legs, knees, ankles, toes): None, normal, Trunk Movements Neck, shoulders, hips: None, normal, Overall Severity Severity of abnormal movements (highest score from questions above): None, normal Incapacitation due to abnormal movements: None, normal Patient's awareness of abnormal movements (rate only patient's report): No Awareness, Dental Status Current problems with teeth and/or dentures?: No Does patient  usually wear dentures?: No  CIWA:    COWS:     Musculoskeletal: Strength & Muscle Tone: within normal limits Gait & Station: normal Patient leans: N/A  Psychiatric Specialty Exam: Physical Exam  Nursing note and vitals reviewed. Constitutional: She is oriented to person, place, and time. She appears well-developed and well-nourished.  HENT:  Head: Normocephalic and atraumatic.  Respiratory: Effort normal.  Neurological: She is alert and oriented to person, place, and time.    ROS  Blood pressure 114/82, pulse 87, temperature 98.1 F (36.7 C), temperature source Oral, resp. rate 16, height '4\' 11"'  (1.499 m), weight 70.8 kg, last menstrual period 11/26/2017.Body mass index is 31.51 kg/m.  General Appearance: Casual  Eye Contact:  Fair  Speech:  Normal Rate  Volume:  Normal  Mood:  Anxious  Affect:  Congruent  Thought Process:  Coherent and Descriptions of Associations: Intact  Orientation:  Full (Time, Place, and Person)  Thought Content:  Logical  Suicidal Thoughts:  No  Homicidal Thoughts:  No  Memory:  Immediate;   Fair Recent;   Fair Remote;   Fair  Judgement:  Intact  Insight:  Fair  Psychomotor Activity:  Normal  Concentration:  Concentration: Fair and Attention Span: Fair  Recall:  [employer] of Knowledge:  Fair  Language:  Fair  Akathisia:  Negative  Handed:  Right  AIMS (if indicated):     Assets:  Desire for Improvement Resilience  ADL's:  Intact  Cognition:  WNL  Sleep:  Number of Hours: 5.25     Treatment Plan Summary: Daily contact with patient to assess and evaluate symptoms and progress in treatment, Medication management and Plan : Patient is seen and examined.  Patient is a 47 year old female with the above-stated past psychiatric history who is seen in follow-up.  #1 major depression-continue Cymbalta 60 mg p.o. daily.  #2 chronic pain-increase gabapentin to 300 mg p.o. 3 times daily.  #3 cocaine dependence-patient does not see that is a major  obstacle at this point.  #4 hypertension continue hydrochlorothiazide and losartan.  #5 cough-Tessalon Perles as needed.  #6 arthritis-patient has been seen by occupational therapy at another facility.  Patient now has a walker.. #7 discharge planning-we will target 913 for possible discharge dates.  Sharma Covert, MD 12/26/2017, 1:04 PM

## 2017-12-26 NOTE — Progress Notes (Signed)
Patient ID: Kathryn [name], female   DOB: [birth year], 47 y.o.   MRN: [id number]   D: Patient denies SI/HI and auditory and visual hallucinations. Patient has a depressed mood and affect. Patient has chronic pain in her knee . Patient is pleasant and thankful for help she receiving.  A: Patient given emotional support from RN. Patient given medications per MD orders. Patient encouraged to attend groups and unit activities. Patient encouraged to come to staff with any questions or concerns.  R: Patient remains cooperative and appropriate. Will continue to monitor patient for safety.

## 2017-12-26 NOTE — BHH Group Notes (Signed)
LCSW Group Therapy Note 12/26/2017 10:38 AM  Type of Therapy/Topic: Group Therapy: Balance in Life  Participation Level: Active  Description of Group:  This group will address the concept of balance and how it feels and looks when one is unbalanced. Patients will be encouraged to process areas in their lives that are out of balance and identify reasons for remaining unbalanced. Facilitators will guide patients in utilizing problem-solving interventions to address and correct the stressor making their life unbalanced. Understanding and applying boundaries will be explored and addressed for obtaining and maintaining a balanced life. Patients will be encouraged to explore ways to assertively make their unbalanced needs known to significant others in their lives, using other group members and facilitator for support and feedback.  Therapeutic Goals: 1. Patient will identify two or more emotions or situations they have that consume much of in their lives. 2. Patient will identify signs/triggers that life has become out of balance:  3. Patient will identify two ways to set boundaries in order to achieve balance in their lives:  4. Patient will demonstrate ability to communicate their needs through discussion and/or role plays  Summary of Patient Progress:  Kinsey was engaged and participated throughout the group session. Rielynn reports that balance for is "having better self esteem". Clella reports that she plans to remain sober and strengthen her faith in order to maintain balance moving forward.     Therapeutic Modalities:  Cognitive Behavioral Therapy Solution-Focused Therapy Assertiveness Training   Theresa Duty Clinical Social Worker

## 2017-12-26 NOTE — Progress Notes (Signed)
Recreation Therapy Notes  Date: 9.11.19 Time: 0930 Location: [address] Dayroom  Group Topic: Stress Management  Goal Area(s) Addresses:  Patient will verbalize importance of using healthy stress management.  Patient will identify positive emotions associated with healthy stress management.   Behavioral Response: Engaged  Intervention: Stress Management  Activity :  Guided Imagery.  LRT introduced the stress management technique of guided imagery.  LRT read a script for patients to envision seeing a starry sky at night.  Patients were to follow along as script was read to engage in activity.  Education:  Stress Management, Discharge Planning.   Education Outcome: Acknowledges edcuation/In group clarification offered/Needs additional education  Clinical Observations/Feedback: Pt attended and participated in group.    Victorino Sparrow, LRT/CTRS     Ria Comment, Demonica Farrey A 12/26/2017 11:35 AM

## 2017-12-26 NOTE — Progress Notes (Signed)
D: Pt was in dayroom upon initial approach.  Pt presents with depressed affect and mood.  Her goal is "keeping clean and going home and staying clean."  Pt denies SI/HI, denies hallucinations, reports L knee pain of 5/10.  Pt has been irritable with female peer on the unit but has been able to maintain control of her behavior.  A: Introduced self to pt.  Actively listened to pt and offered support and encouragement. Medications administered per order.  PRN medication administered for shortness of breath, cough.  Q15 minute safety checks maintained.  R: Pt is safe on the unit.  Pt is compliant with medications.  Pt verbally contracts for safety.  Will continue to monitor and assess.

## 2017-12-26 NOTE — Tx Team (Signed)
Interdisciplinary Treatment and Diagnostic Plan Update  12/26/2017 Time of Session: 0830AM Kathryn [name] MRN: [id number]  Principal Diagnosis: MDD, recurrent, severe  Secondary Diagnoses: Active Problems:   MDD (major depressive disorder), recurrent episode, severe (HCC)   Current Medications:  Current Facility-Administered Medications  Medication Dose Route Frequency Provider Last Rate Last Dose  . acetaminophen (TYLENOL) tablet 650 mg  650 mg Oral Q6H PRN Elmarie Shiley A, NP   650 mg at 12/25/17 0806  . albuterol (PROVENTIL HFA;VENTOLIN HFA) 108 (90 Base) MCG/ACT inhaler 2 puff  2 puff Inhalation Q6H PRN Sharma Covert, MD   2 puff at 12/25/17 0944  . alum & mag hydroxide-simeth (MAALOX/MYLANTA) 200-200-20 MG/5ML suspension 30 mL  30 mL Oral Q4H PRN Elmarie Shiley A, NP      . diclofenac sodium (VOLTAREN) 1 % transdermal gel 2 g  2 g Topical QID Sharma Covert, MD   2 g at 12/25/17 2151  . DULoxetine (CYMBALTA) DR capsule 60 mg  60 mg Oral Daily Sharma Covert, MD   60 mg at 12/26/17 0823  . gabapentin (NEURONTIN) capsule 300 mg  300 mg Oral BID Elmarie Shiley A, NP   300 mg at 12/26/17 6387  . hydrochlorothiazide (HYDRODIURIL) tablet 12.5 mg  12.5 mg Oral Daily Elmarie Shiley A, NP   12.5 mg at 12/26/17 5643  . loratadine (CLARITIN) tablet 10 mg  10 mg Oral Daily Cobos, Myer Peer, MD   10 mg at 12/26/17 0823  . losartan (COZAAR) tablet 25 mg  25 mg Oral BID Elmarie Shiley A, NP   25 mg at 12/25/17 1722  . magnesium hydroxide (MILK OF MAGNESIA) suspension 30 mL  30 mL Oral Daily PRN Elmarie Shiley A, NP      . sucralfate (CARAFATE) 1 GM/10ML suspension 1 g  1 g Oral TID WC & HS Niel Hummer, NP   1 g at 12/26/17 0823  . traZODone (DESYREL) tablet 50 mg  50 mg Oral QHS Sharma Covert, MD   50 mg at 12/25/17 2151   PTA Medications: Medications Prior to Admission  Medication Sig Dispense Refill Last Dose  . citalopram (CELEXA) 20 MG tablet Take 10 mg by mouth 2 (two) times  daily.   12/22/2017  . DULoxetine (CYMBALTA) 30 MG capsule Take 30 mg by mouth daily.   12/22/2017  . gabapentin (NEURONTIN) 300 MG capsule Take 300 mg by mouth 2 (two) times daily.   12/22/2017 at Unknown time  . hydrochlorothiazide (MICROZIDE) 12.5 MG capsule Take 12.5 mg by mouth daily.   12/22/2017  . loratadine (CLARITIN) 10 MG tablet Take 5 mg by mouth 2 (two) times daily.   12/22/2017 at Unknown time  . losartan (COZAAR) 50 MG tablet Take 25 mg by mouth 2 (two) times daily.   12/22/2017  . traZODone (DESYREL) 100 MG tablet Take 50 mg by mouth 2 (two) times daily.   12/22/2017 at Unknown time    Patient Stressors: Marital or family conflict Substance abuse  Patient Strengths: Ability for insight Average or above average intelligence Capable of independent living General fund of knowledge  Treatment Modalities: Medication Management, Group therapy, Case management,  1 to 1 session with clinician, Psychoeducation, Recreational therapy.   Physician Treatment Plan for Primary Diagnosis: MDD, recurrent, severe Long Term Goal(s): Improvement in symptoms so as ready for discharge Improvement in symptoms so as ready for discharge   Short Term Goals: Ability to identify changes in lifestyle to reduce recurrence of  condition will improve Ability to verbalize feelings will improve Ability to disclose and discuss suicidal ideas Ability to demonstrate self-control will improve Ability to identify and develop effective coping behaviors will improve Ability to maintain clinical measurements within normal limits will improve Compliance with prescribed medications will improve Ability to identify triggers associated with substance abuse/mental health issues will improve Ability to identify changes in lifestyle to reduce recurrence of condition will improve Ability to verbalize feelings will improve Ability to disclose and discuss suicidal ideas Ability to demonstrate self-control will improve Ability to  identify and develop effective coping behaviors will improve Ability to maintain clinical measurements within normal limits will improve Compliance with prescribed medications will improve Ability to identify triggers associated with substance abuse/mental health issues will improve  Medication Management: Evaluate patient's response, side effects, and tolerance of medication regimen.  Therapeutic Interventions: 1 to 1 sessions, Unit Group sessions and Medication administration.  Evaluation of Outcomes: Progressing  Physician Treatment Plan for Secondary Diagnosis: Active Problems:   MDD (major depressive disorder), recurrent episode, severe ([place])  Long Term Goal(s): Improvement in symptoms so as ready for discharge Improvement in symptoms so as ready for discharge   Short Term Goals: Ability to identify changes in lifestyle to reduce recurrence of condition will improve Ability to verbalize feelings will improve Ability to disclose and discuss suicidal ideas Ability to demonstrate self-control will improve Ability to identify and develop effective coping behaviors will improve Ability to maintain clinical measurements within normal limits will improve Compliance with prescribed medications will improve Ability to identify triggers associated with substance abuse/mental health issues will improve Ability to identify changes in lifestyle to reduce recurrence of condition will improve Ability to verbalize feelings will improve Ability to disclose and discuss suicidal ideas Ability to demonstrate self-control will improve Ability to identify and develop effective coping behaviors will improve Ability to maintain clinical measurements within normal limits will improve Compliance with prescribed medications will improve Ability to identify triggers associated with substance abuse/mental health issues will improve     Medication Management: Evaluate patient's response, side effects, and  tolerance of medication regimen.  Therapeutic Interventions: 1 to 1 sessions, Unit Group sessions and Medication administration.  Evaluation of Outcomes: Progressing   RN Treatment Plan for Primary Diagnosis: MDD, recurrent, severe Long Term Goal(s): Knowledge of disease and therapeutic regimen to maintain health will improve  Short Term Goals: Ability to remain free from injury will improve, Ability to demonstrate self-control, Ability to verbalize feelings will improve and Ability to disclose and discuss suicidal ideas  Medication Management: RN will administer medications as ordered by provider, will assess and evaluate patient's response and provide education to patient for prescribed medication. RN will report any adverse and/or side effects to prescribing provider.  Therapeutic Interventions: 1 on 1 counseling sessions, Psychoeducation, Medication administration, Evaluate responses to treatment, Monitor vital signs and CBGs as ordered, Perform/monitor CIWA, COWS, AIMS and Fall Risk screenings as ordered, Perform wound care treatments as ordered.  Evaluation of Outcomes: Progressing   LCSW Treatment Plan for Primary Diagnosis: MDD, recurrent, severe Long Term Goal(s): Safe transition to appropriate next level of care at discharge, Engage patient in therapeutic group addressing interpersonal concerns.  Short Term Goals: Engage patient in aftercare planning with referrals and resources, Facilitate acceptance of mental health diagnosis and concerns and Identify triggers associated with mental health/substance abuse issues  Therapeutic Interventions: Assess for all discharge needs, 1 to 1 time with Social worker, Explore available resources and support systems, Assess for  adequacy in community support network, Educate family and significant other(s) on suicide prevention, Complete Psychosocial Assessment, Interpersonal group therapy.  Evaluation of Outcomes: Progressing   Progress in  Treatment: Attending groups: No. Participating in groups: No. Taking medication as prescribed: Yes. Toleration medication: Yes. Family/Significant other contact made: Yes, individual(s) contacted:  pt's daughter Patient understands diagnosis: Yes. Discussing patient identified problems/goals with staff: Yes. Medical problems stabilized or resolved: Yes. Denies suicidal/homicidal ideation: Yes. Issues/concerns per patient self-inventory: No. Other: n/a  New problem(s) identified: No, Describe:  n/a  New Short Term/Long Term Goal(s): detox, medication management for mood stabilization; elimination of SI thoughts; development of comprehensive mental wellness/sobriety plan. '  Patient Goals:  "to learn better ways to cope and feel less depressed."   Discharge Plan or Barriers: CSW assessing--plans to return home at discharge and will follow-up at [facility]. [place] pamphlet, Mobile Crisis information, and AA/NA information provided to patient for additional community support and resources.   Reason for Continuation of Hospitalization: Anxiety Depression Medication stabilization Suicidal ideation  Estimated Length of Stay: Friday, 12/28/17  Attendees: Patient: 12/26/2017 9:28 AM  Physician: Dr. Mallie Darting MD; Dr. Nancy Fetter MD 12/26/2017 9:28 AM  Nursing: Yetta Flock RN; Kim RN 12/26/2017 9:28 AM  RN Care Manager:x 12/26/2017 9:28 AM  Social Worker: Janice Norrie LCSw 12/26/2017 9:28 AM  Recreational Therapist: x 12/26/2017 9:28 AM  Other: Lindell Spar NP 12/26/2017 9:28 AM  Other:  12/26/2017 9:28 AM  Other: 12/26/2017 9:28 AM    Scribe for Treatment Team: Avelina Laine, LCSW 12/26/2017 9:28 AM

## 2017-12-27 DIAGNOSIS — M179 Osteoarthritis of knee, unspecified: Secondary | ICD-10-CM | POA: Diagnosis present

## 2017-12-27 DIAGNOSIS — F1490 Cocaine use, unspecified, uncomplicated: Secondary | ICD-10-CM

## 2017-12-27 DIAGNOSIS — Z87891 Personal history of nicotine dependence: Secondary | ICD-10-CM

## 2017-12-27 DIAGNOSIS — Z79899 Other long term (current) drug therapy: Secondary | ICD-10-CM

## 2017-12-27 DIAGNOSIS — M1712 Unilateral primary osteoarthritis, left knee: Secondary | ICD-10-CM | POA: Diagnosis present

## 2017-12-27 HISTORY — DX: Unilateral primary osteoarthritis, left knee: M17.12

## 2017-12-27 MED ORDER — NABUMETONE 500 MG PO TABS
1000.0000 mg | ORAL_TABLET | Freq: Every day | ORAL | Status: DC
Start: 2017-12-27 — End: 2017-12-28
  Administered 2017-12-27 – 2017-12-28 (×2): 1000 mg via ORAL
  Filled 2017-12-27 (×5): qty 2

## 2017-12-27 NOTE — Progress Notes (Addendum)
[facility] MD Progress Note  12/27/2017 12:47 PM Kathryn [name]  MRN:  [id number]   Subjective: Patient reports today that she feels that she is doing better.  She denies any SI/HI and she contracts for safety.  She reports that from time to time she is hearing some birds chirping and she heard a telephone ring but was unsure if it was a telephone in the hallway outside her room or if it was birds that ran a tree outside of her room.  She states that she has heard some whispering but was unsure if it was her roommate or not.  She states that her only complaint at this time is her pain in her knee and she understands that she is not going to get controlled substances and knows that she is going to have to have surgery before the pain will never completely stop due to her arthritis.  She is asking about an optional medication as the Tylenol does not seem to help very much.  She reviewed her allergies and she stated that she is not allergic to ibuprofen and since it was documented in 2012 she has taken it multiple times without any type of reaction.  She states that she feels that she will be ready to discharge tomorrow as planned and is already contacting her ride to come pick her up.  Objective: Patient's chart and findings reviewed and discussed with treatment team.  Patient presents in the hallway using a walker, mainly for stability due to her knee pain.  She is pleasant, cooperative, and has been attending groups.  Patient is future oriented with plans for AA, NA, therapy after she discharges.  She is already made plans to stay with a friend for a while to ensure that she can be safe.  Discussed medication options with Dr. Mallie Darting after removing the ibuprofen allergy.  The decision was made to start patient on nabumetone thousand milligrams daily with the intention of making it 500 mg twice daily by discharge.  We will continue all other medications as prescribed at this time.  Principal Problem: MDD (major  depressive disorder), recurrent episode, severe ([place]) Diagnosis:   Patient Active Problem List   Diagnosis Date Noted  . MDD (major depressive disorder), recurrent episode, severe ([place]) [F33.2] 12/24/2017  . Lumbar back pain [M54.5] 05/11/2017  . Depression, major, single episode, moderate ([place]) [F32.1] 05/11/2017  . Low vitamin B12 level [E53.8] 05/11/2017  . Immunization due [Z23] 05/11/2017  . Hypertension goal BP (blood pressure) < 140/90 [I10] 01/11/2017   Total Time spent with patient: 30 minutes  Past Psychiatric History: See H&P  Past Medical History:  Past Medical History:  Diagnosis Date  . Asthma   . Chronic back pain   . Hypertension   . Neuropathy     Past Surgical History:  Procedure Laterality Date  . TUBAL LIGATION     Family History:  Family History  Problem Relation Age of Onset  . Migraines Neg Hx    Family Psychiatric  History: See H&P Social History:  Social History   Substance and Sexual Activity  Alcohol Use Yes   Comment: occ     Social History   Substance and Sexual Activity  Drug Use Yes  . Types: Cocaine   Comment: crack    Social History   Socioeconomic History  . Marital status: Single    Spouse name: Not on file  . Number of children: Not on file  . Years of education: Not  on file  . Highest education level: Not on file  Occupational History  . Not on file  Social Needs  . Financial resource strain: Not on file  . Food insecurity:    Worry: Not on file    Inability: Not on file  . Transportation needs:    Medical: Not on file    Non-medical: Not on file  Tobacco Use  . Smoking status: Former Smoker    Types: Cigarettes  . Smokeless tobacco: Never Used  Substance and Sexual Activity  . Alcohol use: Yes    Comment: occ  . Drug use: Yes    Types: Cocaine    Comment: crack  . Sexual activity: Yes    Birth control/protection: None  Lifestyle  . Physical activity:    Days per week: Not on file    Minutes per  session: Not on file  . Stress: Not on file  Relationships  . Social connections:    Talks on phone: Not on file    Gets together: Not on file    Attends religious service: Not on file    Active member of club or organization: Not on file    Attends meetings of clubs or organizations: Not on file    Relationship status: Not on file  Other Topics Concern  . Not on file  Social History Narrative  . Not on file   Additional Social History:                         Sleep: Good  Appetite:  Good  Current Medications: Current Facility-Administered Medications  Medication Dose Route Frequency Provider Last Rate Last Dose  . acetaminophen (TYLENOL) tablet 650 mg  650 mg Oral Q6H PRN Elmarie Shiley A, NP   650 mg at 12/27/17 0757  . albuterol (PROVENTIL HFA;VENTOLIN HFA) 108 (90 Base) MCG/ACT inhaler 2 puff  2 puff Inhalation Q6H PRN Sharma Covert, MD   2 puff at 12/27/17 0757  . alum & mag hydroxide-simeth (MAALOX/MYLANTA) 200-200-20 MG/5ML suspension 30 mL  30 mL Oral Q4H PRN Elmarie Shiley A, NP   30 mL at 12/27/17 1118  . benzonatate (TESSALON) capsule 100 mg  100 mg Oral TID PRN Sharma Covert, MD   100 mg at 12/27/17 0757  . diclofenac sodium (VOLTAREN) 1 % transdermal gel 2 g  2 g Topical QID Sharma Covert, MD   2 g at 12/26/17 2105  . DULoxetine (CYMBALTA) DR capsule 60 mg  60 mg Oral Daily Sharma Covert, MD   60 mg at 12/27/17 0754  . gabapentin (NEURONTIN) capsule 300 mg  300 mg Oral TID Sharma Covert, MD   300 mg at 12/27/17 1141  . hydrochlorothiazide (HYDRODIURIL) tablet 12.5 mg  12.5 mg Oral Daily Elmarie Shiley A, NP   12.5 mg at 12/27/17 0754  . loratadine (CLARITIN) tablet 10 mg  10 mg Oral Daily Cobos, Myer Peer, MD   10 mg at 12/27/17 0754  . losartan (COZAAR) tablet 25 mg  25 mg Oral BID Elmarie Shiley A, NP   25 mg at 12/27/17 0754  . magnesium hydroxide (MILK OF MAGNESIA) suspension 30 mL  30 mL Oral Daily PRN Elmarie Shiley A, NP      .  nabumetone (RELAFEN) tablet 1,000 mg  1,000 mg Oral Daily Ina Scrivens, Darnelle Maffucci B, FNP      . sucralfate (CARAFATE) 1 GM/10ML suspension 1 g  1 g Oral TID WC &  HS Elmarie Shiley A, NP   1 g at 12/27/17 1154  . traZODone (DESYREL) tablet 50 mg  50 mg Oral QHS Sharma Covert, MD   50 mg at 12/26/17 2105    Lab Results: No results found for this or any previous visit (from the past 64 hour(s)).  Blood Alcohol level:  Lab Results  Component Value Date   ETH <10 16/01/9603    Metabolic Disorder Labs: Lab Results  Component Value Date   HGBA1C 5.4 04/20/2017   MPG 108 04/20/2017   No results found for: PROLACTIN Lab Results  Component Value Date   CHOL 155 04/20/2017   TRIG 46 04/20/2017   HDL 70 04/20/2017   CHOLHDL 2.2 04/20/2017   LDLCALC 72 04/20/2017    Physical Findings: AIMS: Facial and Oral Movements Muscles of Facial Expression: None, normal Lips and Perioral Area: None, normal Jaw: None, normal Tongue: None, normal,Extremity Movements Upper (arms, wrists, hands, fingers): None, normal Lower (legs, knees, ankles, toes): None, normal, Trunk Movements Neck, shoulders, hips: None, normal, Overall Severity Severity of abnormal movements (highest score from questions above): None, normal Incapacitation due to abnormal movements: None, normal Patient's awareness of abnormal movements (rate only patient's report): No Awareness, Dental Status Current problems with teeth and/or dentures?: No Does patient usually wear dentures?: No  CIWA:    COWS:     Musculoskeletal: Strength & Muscle Tone: within normal limits Gait & Station: unsteady Patient leans: Right  Psychiatric Specialty Exam: Physical Exam  Nursing note and vitals reviewed. Constitutional: She is oriented to person, place, and time. She appears well-developed and well-nourished.  Cardiovascular: Normal rate.  Respiratory: Effort normal.  Musculoskeletal: She exhibits tenderness (left knee).  Neurological: She is  alert and oriented to person, place, and time.  Skin: Skin is warm.    Review of Systems  Constitutional: Negative.   HENT: Negative.   Eyes: Negative.   Respiratory: Negative.   Cardiovascular: Negative.   Gastrointestinal: Negative.   Genitourinary: Negative.   Musculoskeletal: Positive for joint pain.  Skin: Negative.   Neurological: Negative.   Endo/Heme/Allergies: Negative.   Psychiatric/Behavioral: Negative for depression and suicidal ideas.    Blood pressure 121/86, pulse 87, temperature 98.1 F (36.7 C), temperature source Oral, resp. rate 16, height 4\' 11"  (1.499 m), weight 70.8 kg.Body mass index is 31.51 kg/m.  General Appearance: Casual  Eye Contact:  Good  Speech:  Clear and Coherent and Normal Rate  Volume:  Normal  Mood:  Euthymic  Affect:  Appropriate  Thought Process:  Goal Directed and Descriptions of Associations: Intact  Orientation:  Full (Time, Place, and Person)  Thought Content:  WDL  Suicidal Thoughts:  No  Homicidal Thoughts:  No  Memory:  Immediate;   Good Recent;   Good Remote;   Good  Judgement:  Fair  Insight:  Fair  Psychomotor Activity:  Normal  Concentration:  Concentration: Good and Attention Span: Good  Recall:  Good  Fund of Knowledge:  Good  Language:  Good  Akathisia:  No  Handed:  Right  AIMS (if indicated):     Assets:  Communication Skills Desire for Improvement Financial Resources/Insurance Housing Resilience Social Support Transportation  ADL's:  Intact  Cognition:  WNL  Sleep:  Number of Hours: 6.75   Problems addressed Major depressive disorder recurrent severe Osteoarthritis  Treatment Plan Summary: Daily contact with patient to assess and evaluate symptoms and progress in treatment, Medication management and Plan is to: Continue Cymbalta 60 mg p.o.  daily for mood stability Continue Neurontin 300 mg p.o. 3 times daily for neuropathic pain Continue trazodone 50 mg p.o. nightly as needed for insomnia Start  nabumetone 1000 mg p.o. daily for osteoarthritis Encourage group therapy participation Discharge plan is for tomorrow  Lewis Shock, FNP 12/27/2017, 12:47 PM

## 2017-12-27 NOTE — Progress Notes (Signed)
D: Pt was in bed in her room upon initial approach.  Pt presents with depressed affect and mood.  She is pleasant with staff.  She reports her day has been "all right baby."  Her goal is "going home and staying clean."  Pt reports she is discharging tomorrow and feels safe to do so.  Pt denies SI/HI, denies hallucinations.  Pt has been visible in milieu interacting with peers and staff appropriately.  Pt attended evening group.  Pt has used walker for mobility tonight.    A: Introduced self to pt.  Actively listened to pt and offered support and encouragement. Medications administered per order.  PRN medication administered for cough.  Heat packs provided for pain.  Q15 minute safety checks maintained.  R: Pt is safe on the unit.  Pt is compliant with medications.  Pt verbally contracts for safety.  Will continue to monitor and assess.

## 2017-12-27 NOTE — Progress Notes (Signed)
[facility] Group Notes:  (Nursing/MHT/Case Management/Adjunct)  Date:  12/27/2017  Time: 2045  Type of Therapy:  wrap up group  Participation Level:  Active  Participation Quality:  Appropriate, Attentive, Sharing and Supportive  Affect:  Appropriate and Excited  Cognitive:  Appropriate  Insight:  Improving  Engagement in Group:  Engaged  Modes of Intervention:  Clarification, Education and Support  Summary of Progress/Problems:  Kathryn [name] 12/27/2017, 9:55 PM

## 2017-12-27 NOTE — Progress Notes (Signed)
DAR NOTE: Patient presents with anxious affect and depressed mood. Pt has been visible in the day interacting with peers. Pt stated she feeling better as compared to others days. Reported good sleep. Good appetite, good energy and good concentration. Complained of left leg pain, denies auditory and visual hallucinations.  Rates depression at 2, hopelessness at 2, and anxiety at 2.  Maintained on routine safety checks.  Medications given as prescribed.  Support and encouragement offered as needed.  Attended group and participated. Patient observed socializing with peers in the dayroom.  Offered no complaint.

## 2017-12-27 NOTE — [facility] Group Notes (Signed)
LCSW Group Therapy Note   12/27/2017 1:15pm   Type of Therapy and Topic:  Group Therapy:  Overcoming Obstacles   Participation Level:  Active   Description of Group:    In this group patients will be encouraged to explore what they see as obstacles to their own wellness and recovery. They will be guided to discuss their thoughts, feelings, and behaviors related to these obstacles. The group will process together ways to cope with barriers, with attention given to specific choices patients can make. Each patient will be challenged to identify changes they are motivated to make in order to overcome their obstacles. This group will be process-oriented, with patients participating in exploration of their own experiences as well as giving and receiving support and challenge from other group members.   Therapeutic Goals: 1. Patient will identify personal and current obstacles as they relate to admission. 2. Patient will identify barriers that currently interfere with their wellness or overcoming obstacles.  3. Patient will identify feelings, thought process and behaviors related to these barriers. 4. Patient will identify two changes they are willing to make to overcome these obstacles:      Summary of Patient Progress   Kathryn [name] was attentive and engaged during today's processing group. She shared that her biggest obstacle involves maintaining her sobriety when she returns home. "I don't want to let my grand babies or myself down anymore." Kathryn [name] shared that she plans to talk with her PO about getting into [facility] in addition to following up at [facility]. Kathryn [name] continues to show progress in the group setting with improving insight.    Therapeutic Modalities:   Cognitive Behavioral Therapy Solution Focused Therapy Motivational Interviewing Relapse Prevention Therapy  Avelina Laine, LCSW 12/27/2017 11:20 AM

## 2017-12-28 MED ORDER — GABAPENTIN 300 MG PO CAPS: 300.0000 mg | ORAL_CAPSULE | Freq: Three times a day (TID) | ORAL | 0 refills | Status: DC

## 2017-12-28 MED ORDER — DULOXETINE HCL 60 MG PO CPEP: 60.0000 mg | ORAL_CAPSULE | Freq: Every day | ORAL | 0 refills | Status: DC

## 2017-12-28 MED ORDER — TRAZODONE HCL 50 MG PO TABS: 50.0000 mg | ORAL_TABLET | Freq: Every day | ORAL | 0 refills | Status: DC

## 2017-12-28 NOTE — Progress Notes (Signed)
  [facility] Adult Case Management Discharge Plan :  Will you be returning to the same living situation after discharge:  Yes,  home At discharge, do you have transportation home?: Yes,  daughter will be picking her up at 1pm Do you have the ability to pay for your medications: Yes,  mental health  Release of information consent forms completed and submitted to medical records by CSW.  Patient to Follow up at: Follow-up Information    [facility] Follow up on 01/01/2018.   Why:  Hospital follow-up on Tuesday, 9/17 at 10:00AM. Please bring hospital discharge paperwork to this appt. Thank you.  Contact information: [address] [place] [address] [phone]           Next level of care provider has access to [place] and Suicide Prevention discussed: Yes,  SPE completed with pt's daughter and with pt. SPI pamphlet and Mobile Crisis information provided.   Have you used any form of tobacco in the last 30 days? (Cigarettes, Smokeless Tobacco, Cigars, and/or Pipes): No  Has patient been referred to the Quitline?: N/A patient is not a smoker  Patient has been referred for addiction treatment: Yes  Avelina Laine, LCSW 12/28/2017, 8:47 AM

## 2017-12-28 NOTE — Progress Notes (Signed)
CSW left message for pt's probation officer per her request ([place]) [phone], with discharge information and aftercare appt.   Kaizlee Carlino S. Ouida Sills, MSW, LCSW Clinical Social Worker 12/28/2017 10:47 AM

## 2017-12-28 NOTE — Progress Notes (Addendum)
Pt discharged home with her female friend. Pt was ambulatory, stable and appreciative at that time. All papers, medication sample, and prescriptions were given and valuables returned. Verbal understanding expressed. Denies SI/HI and A/VH. Pt given opportunity to express concerns and ask questions.

## 2017-12-28 NOTE — Plan of Care (Signed)
  Problem: Activity: Goal: Sleeping patterns will improve Outcome: Progressing Note:  Slept 6.75 hours last night per flowsheet.

## 2017-12-28 NOTE — BHH Suicide Risk Assessment (Signed)
[facility] Discharge Suicide Risk Assessment   Principal Problem: MDD (major depressive disorder), recurrent episode, severe ([place]) Discharge Diagnoses:  Patient Active Problem List   Diagnosis Date Noted  . Osteoarthritis of left knee [M17.12] 12/27/2017  . MDD (major depressive disorder), recurrent episode, severe ([place]) [F33.2] 12/24/2017  . Lumbar back pain [M54.5] 05/11/2017  . Depression, major, single episode, moderate ([place]) [F32.1] 05/11/2017  . Low vitamin B12 level [E53.8] 05/11/2017  . Immunization due [Z23] 05/11/2017  . Hypertension goal BP (blood pressure) < 140/90 [I10] 01/11/2017    Total Time spent with patient: 15 minutes  Musculoskeletal: Strength & Muscle Tone: within normal limits Gait & Station: broad based Patient leans: N/A  Psychiatric Specialty Exam: Review of Systems  Musculoskeletal: Positive for myalgias.  All other systems reviewed and are negative.   Blood pressure 116/78, pulse 85, temperature 98.7 F (37.1 C), temperature source Oral, resp. rate 16, height 4\' 11"  (1.499 m), weight 70.8 kg.Body mass index is 31.51 kg/m.  General Appearance: Casual  Eye Contact::  Fair  Speech:  Normal Rate409  Volume:  Normal  Mood:  Euthymic  Affect:  Congruent  Thought Process:  Coherent and Descriptions of Associations: Intact  Orientation:  Full (Time, Place, and Person)  Thought Content:  Logical  Suicidal Thoughts:  No  Homicidal Thoughts:  No  Memory:  Immediate;   Fair Recent;   Fair Remote;   Fair  Judgement:  Intact  Insight:  Fair  Psychomotor Activity:  Normal  Concentration:  Fair  Recall:  [employer] of Knowledge:Fair  Language: Fair  Akathisia:  Negative  Handed:  Right  AIMS (if indicated):     Assets:  Communication Skills Desire for Improvement Housing Resilience Social Support  Sleep:  Number of Hours: 5  Cognition: WNL  ADL's:  Intact   Mental Status Per Nursing Assessment::   On Admission:  Suicidal ideation indicated by  patient, Self-harm thoughts, Self-harm behaviors  Demographic Factors:  Divorced or widowed and Unemployed  Loss Factors: NA  Historical Factors: Impulsivity  Risk Reduction Factors:   Sense of responsibility to family and Living with another person, especially a relative  Continued Clinical Symptoms:  Depression:   Comorbid alcohol abuse/dependence Impulsivity Alcohol/Substance Abuse/Dependencies  Cognitive Features That Contribute To Risk:  None    Suicide Risk:  Minimal: No identifiable suicidal ideation.  Patients presenting with no risk factors but with morbid ruminations; may be classified as minimal risk based on the severity of the depressive symptoms  Follow-up Information    [facility] Follow up on 01/01/2018.   Why:  Hospital follow-up on Tuesday, 9/17 at 10:00AM. Please bring hospital discharge paperwork to this appt. Thank you.  Contact information: [place] [address] [phone]           Plan Of Care/Follow-up recommendations:  Activity:  ad lib  Sharma Covert, MD 12/28/2017, 9:30 AM

## 2017-12-28 NOTE — Discharge Summary (Signed)
Physician Discharge Summary Note  Patient:  Kathryn [name] is an 47 y.o., female MRN:  [id number] DOB:  1971/04/14 Patient phone:  [phone] (home)  Patient address:   [address] [place] [address],  Total Time spent with patient: 30 minutes  Date of Admission:  12/24/2017 Date of Discharge:12/28/2017  Reason for Admission:  Patient is seen and examined. Patient is a 47 year old female who presented to the [facility] emergency department with suicidal ideation. The patient stated that she was drinking apple cider vinegar mixed with Clorox and attempt to kill her self. She stated she had never had a suicide attempt before. She stated she had not had suicidal ideation before. She stated that she has had several difficulties in her life. She noted the deaths of her fianc, nephew and niece. She also reported that she had relapsed on crack cocaine, and her son who usually pays her bills had now decided to refuse to pay them. She had gotten into an argument with her son, and he had threatened to take his child away from her. She was the primary caregiver of the child most currently. She stated that she was so heartbroken over this that she decided to drink a concoction and kill her self. She has a long history of cocaine dependence. She has been in drug treatment on 4 occasions. Her last was in 2005. She stated she was sober for approximately 6 months after that. She most recently had worked at [employer] in 2019, but was unable to continue to work there because of some orthopedic injuries. [facility]. She is currently prescribed Celexa, Cymbalta. She also is on gabapentin for chronic pain. She denied any suicidal ideation currently. She admitted to the neurovegetative symptoms of depression including helplessness, hopelessness and worthlessness.  Associated Signs/Symptoms: Depression Symptoms:  depressed  mood, anhedonia, insomnia, psychomotor retardation, fatigue, feelings of worthlessness/guilt, difficulty concentrating, hopelessness, suicidal thoughts without plan, suicidal attempt, anxiety, disturbed sleep, (Hypo) Manic Symptoms:  Impulsivity, Irritable Mood, Anxiety Symptoms:  Excessive Worry, Psychotic Symptoms:  Denied PTSD Symptoms: Negative Total Time spent with patient: 30 minutes  Past Psychiatric History: Patient stated that she had had no previous suicide attempts.  She stated she had been in substance abuse rehabilitation programs 4 times.  Her last was in 2005.  [facility].  Consequences of Substance Abuse: Family Consequences:  Patient stated that her son had cut off paying her bills, and it also been trying to make sure that he had custody of his child that she cared for  Principal Problem: MDD (major depressive disorder), recurrent episode, severe [facility]) Discharge Diagnoses: Patient Active Problem List   Diagnosis Date Noted  . Osteoarthritis of left knee [M17.12] 12/27/2017  . MDD (major depressive disorder), recurrent episode, severe ([place]) [F33.2] 12/24/2017  . Lumbar back pain [M54.5] 05/11/2017  . Depression, major, single episode, moderate ([place]) [F32.1] 05/11/2017  . Low vitamin B12 level [E53.8] 05/11/2017  . Immunization due [Z23] 05/11/2017  . Hypertension goal BP (blood pressure) < 140/90 [I10] 01/11/2017    Past Medical History:  Past Medical History:  Diagnosis Date  . Asthma   . Chronic back pain   . Hypertension   . Neuropathy     Past Surgical History:  Procedure Laterality Date  . TUBAL LIGATION     Family History:  Family History  Problem Relation Age of Onset  . Migraines Neg Hx  Family Psychiatric  History: Denied Social History:  Social History   Substance and Sexual Activity  Alcohol Use Yes   Comment: occ     Social History   Substance and Sexual Activity  Drug Use Yes  . Types:  Cocaine   Comment: crack    Social History   Socioeconomic History  . Marital status: Single    Spouse name: Not on file  . Number of children: Not on file  . Years of education: Not on file  . Highest education level: Not on file  Occupational History  . Not on file  Social Needs  . Financial resource strain: Not on file  . Food insecurity:    Worry: Not on file    Inability: Not on file  . Transportation needs:    Medical: Not on file    Non-medical: Not on file  Tobacco Use  . Smoking status: Former Smoker    Types: Cigarettes  . Smokeless tobacco: Never Used  Substance and Sexual Activity  . Alcohol use: Yes    Comment: occ  . Drug use: Yes    Types: Cocaine    Comment: crack  . Sexual activity: Yes    Birth control/protection: None  Lifestyle  . Physical activity:    Days per week: Not on file    Minutes per session: Not on file  . Stress: Not on file  Relationships  . Social connections:    Talks on phone: Not on file    Gets together: Not on file    Attends religious service: Not on file    Active member of club or organization: Not on file    Attends meetings of clubs or organizations: Not on file    Relationship status: Not on file  Other Topics Concern  . Not on file  Social History Narrative  . Not on file    Hospital Course:  Kathryn [name] was admitted for MDD (major depressive disorder), recurrent episode, severe ([place]) and crisis management. She was treated with the following medications Duloxetine 60mg  po daily, Gabapentn 300mg  po TID, .  Kathryn [name] was discharged with current medication and was instructed on how to take medications as prescribed; (details listed below under Medication List).  Medical problems were identified and treated as needed.  Home medications were restarted as appropriate. All labs are normal, except platelet count of 540, and WBC 12.3. UDS positive for Cocaine.   Improvement was monitored by observation and Ferdinand Lango daily report of symptom reduction.  Emotional and mental status was monitored by daily self-inventory reports completed by Ferdinand Lango and clinical staff.         Ferdinand Lango was evaluated by the treatment team for stability and plans for continued recovery upon discharge.  Ferdinand Lango motivation was an integral factor for scheduling further treatment.  Employment, transportation, bed availability, health status, family support, and any pending legal issues were also considered during her hospital stay.  She was offered further treatment options upon discharge including but not limited to Residential, Intensive Outpatient, and Outpatient treatment.  Kathryn [name] will follow up with the services as listed below under Follow Up Information.     Upon completion of this admission the Kathryn [name] was both mentally and medically stable for discharge denying suicidal/homicidal ideation, auditory/visual/tactile hallucinations, delusional thoughts and paranoia.      Physical Findings: AIMS: Facial and Oral Movements Muscles of Facial Expression: None, normal  Lips and Perioral Area: None, normal Jaw: None, normal Tongue: None, normal,Extremity Movements Upper (arms, wrists, hands, fingers): None, normal Lower (legs, knees, ankles, toes): None, normal, Trunk Movements Neck, shoulders, hips: None, normal, Overall Severity Severity of abnormal movements (highest score from questions above): None, normal Incapacitation due to abnormal movements: None, normal Patient's awareness of abnormal movements (rate only patient's report): No Awareness, Dental Status Current problems with teeth and/or dentures?: No Does patient usually wear dentures?: No  CIWA:    COWS:     Musculoskeletal: Strength & Muscle Tone: within normal limits Gait & Station: normal Patient leans: N/A  Psychiatric Specialty Exam: Physical Exam  ROS  Blood pressure 116/78, pulse 85, temperature 98.7 F (37.1  C), temperature source Oral, resp. rate 16, height 4\' 11"  (1.499 m), weight 70.8 kg.Body mass index is 31.51 kg/m.  Sleep:  Number of Hours: 5     Have you used any form of tobacco in the last 30 days? (Cigarettes, Smokeless Tobacco, Cigars, and/or Pipes): No  Has this patient used any form of tobacco in the last 30 days? (Cigarettes, Smokeless Tobacco, Cigars, and/or Pipes)  No  Blood Alcohol level:  Lab Results  Component Value Date   ETH <10 53/66/4403    Metabolic Disorder Labs:  Lab Results  Component Value Date   HGBA1C 5.4 04/20/2017   MPG 108 04/20/2017   No results found for: PROLACTIN Lab Results  Component Value Date   CHOL 155 04/20/2017   TRIG 46 04/20/2017   HDL 70 04/20/2017   CHOLHDL 2.2 04/20/2017   LDLCALC 72 04/20/2017    See Psychiatric Specialty Exam and Suicide Risk Assessment completed by Attending Physician prior to discharge.  Discharge destination:  Home  Is patient on multiple antipsychotic therapies at discharge:  No   Has Patient had three or more failed trials of antipsychotic monotherapy by history:  No  Recommended Plan for Multiple Antipsychotic Therapies: NA  Discharge Instructions    Discharge instructions   Complete by:  As directed    Please continue to take medications as directed. If your symptoms return, worsen, or persist please call your 911, report to local ER, or contact crisis hotline. Please do not drink alcohol or use any illegal substances while taking prescription medications.     Allergies as of 12/28/2017      Reactions   Clonopin [clonazepam] Anaphylaxis   Swelling of the tongue and mouth.    Fish Allergy Anaphylaxis, Shortness Of Breath, Swelling   Flexeril [cyclobenzaprine Hcl] Shortness Of Breath   Ace Inhibitors Cough   Tramadol Nausea And Vomiting   Upset stomach      Medication List    STOP taking these medications   citalopram 20 MG tablet Commonly known as:  CELEXA     TAKE these medications      Indication  DULoxetine 60 MG capsule Commonly known as:  CYMBALTA Take 1 capsule (60 mg total) by mouth daily. Start taking on:  12/29/2017 What changed:    medication strength  how much to take  Indication:  Major Depressive Disorder   gabapentin 300 MG capsule Commonly known as:  NEURONTIN Take 1 capsule (300 mg total) by mouth 3 (three) times daily. What changed:  when to take this  Indication:  Alcohol Withdrawal Syndrome   hydrochlorothiazide 12.5 MG capsule Commonly known as:  MICROZIDE Take 12.5 mg by mouth daily.  Indication:  Edema, High Blood Pressure Disorder   loratadine 10 MG tablet Commonly known as:  CLARITIN Take 5 mg by mouth 2 (two) times daily.  Indication:  Hayfever   losartan 50 MG tablet Commonly known as:  COZAAR Take 25 mg by mouth 2 (two) times daily.  Indication:  High Blood Pressure Disorder   traZODone 50 MG tablet Commonly known as:  DESYREL Take 1 tablet (50 mg total) by mouth at bedtime. What changed:    medication strength  when to take this  Indication:  Trouble Sleeping      Follow-up Information    [facility] Follow up on 01/01/2018.   Why:  Hospital follow-up on Tuesday, 9/17 at 10:00AM. Please bring hospital discharge paperwork to this appt. Thank you.  Contact information: [address] [place] [address] [phone]           Follow-up recommendations:  Activity:  Increase activity as tolerated  Diet:  Routine diet as directed by outpatient hysician Tests:  Routine testing as suggested by outpatient psychiatrist Other:  Even if you begin to feel better continue taking your medication.    Signed: Nanci Pina, FNP 12/28/2017, 9:20 AM

## 2018-01-02 ENCOUNTER — Telehealth: Payer: Self-pay

## 2018-01-02 NOTE — Telephone Encounter (Signed)
Patient is no longer a patient of [facility]/Dr.Rachel Hagler. No TOC call will be completed

## 2018-01-03 ENCOUNTER — Ambulatory Visit (INDEPENDENT_AMBULATORY_CARE_PROVIDER_SITE_OTHER): Payer: Self-pay | Admitting: Orthopaedic Surgery

## 2018-01-03 ENCOUNTER — Encounter (INDEPENDENT_AMBULATORY_CARE_PROVIDER_SITE_OTHER): Payer: Self-pay | Admitting: Orthopaedic Surgery

## 2018-01-03 DIAGNOSIS — M25562 Pain in left knee: Secondary | ICD-10-CM

## 2018-01-03 DIAGNOSIS — G8929 Other chronic pain: Secondary | ICD-10-CM
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
## 2018-01-10 ENCOUNTER — Ambulatory Visit (HOSPITAL_COMMUNITY)
Admission: RE | Admit: 2018-01-10 | Discharge: 2018-01-10 | Disposition: A | Discharge status: Home / Self Care | Payer: Medicaid Other | Source: Ambulatory Visit | Attending: Orthopaedic Surgery | Admitting: Orthopaedic Surgery

## 2018-01-10 ENCOUNTER — Other Ambulatory Visit: Payer: Self-pay | Admitting: Family Medicine

## 2018-01-10 DIAGNOSIS — M25562 Pain in left knee: Secondary | ICD-10-CM

## 2018-01-10 DIAGNOSIS — G8929 Other chronic pain: Secondary | ICD-10-CM

## 2018-01-10 DIAGNOSIS — M25462 Effusion, left knee: Secondary | ICD-10-CM | POA: Insufficient documentation

## 2018-02-06 ENCOUNTER — Encounter (HOSPITAL_COMMUNITY): Payer: Self-pay | Admitting: Emergency Medicine

## 2018-02-06 ENCOUNTER — Other Ambulatory Visit: Payer: Self-pay

## 2018-02-06 ENCOUNTER — Emergency Department (HOSPITAL_COMMUNITY)
Admission: EM | Admit: 2018-02-06 | Discharge: 2018-02-06 | Disposition: A | Discharge status: Home / Self Care | Payer: Medicaid Other | Attending: Emergency Medicine | Admitting: Emergency Medicine

## 2018-02-06 DIAGNOSIS — K2100 Gastro-esophageal reflux disease with esophagitis, without bleeding: Secondary | ICD-10-CM

## 2018-02-06 DIAGNOSIS — Z79899 Other long term (current) drug therapy: Secondary | ICD-10-CM | POA: Insufficient documentation

## 2018-02-06 DIAGNOSIS — J45909 Unspecified asthma, uncomplicated: Secondary | ICD-10-CM | POA: Insufficient documentation

## 2018-02-06 DIAGNOSIS — I10 Essential (primary) hypertension: Secondary | ICD-10-CM | POA: Diagnosis not present

## 2018-02-06 DIAGNOSIS — R42 Dizziness and giddiness: Secondary | ICD-10-CM | POA: Diagnosis not present

## 2018-02-06 DIAGNOSIS — Z87891 Personal history of nicotine dependence: Secondary | ICD-10-CM | POA: Insufficient documentation

## 2018-02-06 LAB — CBC WITH DIFFERENTIAL/PLATELET
Abs Immature Granulocytes: 0.16 10*3/uL — ABNORMAL HIGH (ref 0.00–0.07)
Basophils Absolute: 0 10*3/uL (ref 0.0–0.1)
Basophils Relative: 0 %
Eosinophils Absolute: 0.1 10*3/uL (ref 0.0–0.5)
Eosinophils Relative: 1 %
HCT: 36.2 % (ref 36.0–46.0)
Hemoglobin: 11.7 g/dL — ABNORMAL LOW (ref 12.0–15.0)
Immature Granulocytes: 1 %
Lymphocytes Relative: 27 %
Lymphs Abs: 3.5 10*3/uL (ref 0.7–4.0)
MCH: 31.3 pg (ref 26.0–34.0)
MCHC: 32.3 g/dL (ref 30.0–36.0)
MCV: 96.8 fL (ref 80.0–100.0)
Monocytes Absolute: 0.7 10*3/uL (ref 0.1–1.0)
Monocytes Relative: 5 %
Neutro Abs: 8.9 10*3/uL — ABNORMAL HIGH (ref 1.7–7.7)
Neutrophils Relative %: 66 %
Platelets: 518 10*3/uL — ABNORMAL HIGH (ref 150–400)
RBC: 3.74 MIL/uL — ABNORMAL LOW (ref 3.87–5.11)
RDW: 13.3 % (ref 11.5–15.5)
WBC: 13.3 10*3/uL — ABNORMAL HIGH (ref 4.0–10.5)
nRBC: 0 % (ref 0.0–0.2)

## 2018-02-06 LAB — BASIC METABOLIC PANEL
Anion gap: 7 (ref 5–15)
BUN: 18 mg/dL (ref 6–20)
CO2: 24 mmol/L (ref 22–32)
Calcium: 8.7 mg/dL — ABNORMAL LOW (ref 8.9–10.3)
Chloride: 104 mmol/L (ref 98–111)
Creatinine, Ser: 0.77 mg/dL (ref 0.44–1.00)
GFR calc Af Amer: >60 mL/min (ref >60)
GFR calc non Af Amer: >60 mL/min (ref >60)
Glucose, Bld: 102 mg/dL — ABNORMAL HIGH (ref 70–99)
Potassium: 4.1 mmol/L (ref 3.5–5.1)
Sodium: 135 mmol/L (ref 135–145)

## 2018-02-06 LAB — POC OCCULT BLOOD, ED: Fecal Occult Bld: NEGATIVE

## 2018-02-06 MED ORDER — FAMOTIDINE 20 MG PO TABS: 20.0000 mg | ORAL_TABLET | Freq: Two times a day (BID) | ORAL | 0 refills | Status: DC

## 2018-02-06 MED ORDER — MECLIZINE HCL 25 MG PO TABS: 25.0000 mg | ORAL_TABLET | Freq: Three times a day (TID) | ORAL | 0 refills | Status: DC | PRN

## 2018-02-06 NOTE — ED Triage Notes (Signed)
PT c/o dizziness with movement and nausea x4 days. PT states she is on amoxicillin and had several teeth removed at the dentist yesterday and since then her blood pressure has been elevated.

## 2018-02-08 ENCOUNTER — Encounter: Payer: Self-pay | Admitting: Adult Health

## 2018-02-09 NOTE — ED Provider Notes (Signed)
[facility] EMERGENCY DEPARTMENT Provider Note   CSN: [id number] Arrival date & time: 02/06/18  0703     History   Chief Complaint Chief Complaint  Patient presents with  . Dizziness    HPI [name] is a 47 y.o. female.  HPI   47 year old female with 2 complaints..  Primarily dizziness.  She describes an sensation of feeling off balance and room spinning.  Worse with movement.  Associate with nausea.  Improved with rest.  No tenderness.  No headaches.  No visual changes.  She is also complaining some pressure and burning from her epigastric region up into her chest.  Worse when she lays down.  Stool has been dark in color but not really tarry.  Denies any history of PUD.  She is not anticoagulated.  Only sporadic NSAID usage.  Past Medical History:  Diagnosis Date  . Asthma   . Chronic back pain   . Hypertension   . Neuropathy     Patient Active Problem List   Diagnosis Date Noted  . Osteoarthritis of left knee 12/27/2017  . MDD (major depressive disorder), recurrent episode, severe ([place]) 12/24/2017  . Lumbar back pain 05/11/2017  . Depression, major, single episode, moderate ([place]) 05/11/2017  . Low vitamin B12 level 05/11/2017  . Immunization due 05/11/2017  . Hypertension goal BP (blood pressure) < 140/90 01/11/2017    Past Surgical History:  Procedure Laterality Date  . TUBAL LIGATION       OB History    Gravida  5   Para  3   Term  2   Preterm  1   AB  2   Living  3     SAB  1   TAB  1   Ectopic      Multiple      Live Births               Home Medications    Prior to Admission medications   Medication Sig Start Date End Date Taking? Authorizing Provider  citalopram (CELEXA) 20 MG tablet Take by mouth. 11/26/17   [provider]  DULoxetine (CYMBALTA) 60 MG capsule Take 1 capsule (60 mg total) by mouth daily. 12/29/17   Starkes-Perry, Gayland Curry, FNP  famotidine (PEPCID) 20 MG tablet Take 1 tablet (20 mg total) by  mouth 2 (two) times daily. 02/06/18   Virgel Manifold, MD  gabapentin (NEURONTIN) 300 MG capsule Take 1 capsule (300 mg total) by mouth 3 (three) times daily. 12/28/17   Starkes-Perry, Gayland Curry, FNP  hydrochlorothiazide (MICROZIDE) 12.5 MG capsule Take 12.5 mg by mouth daily.    [provider]  ibuprofen (ADVIL,MOTRIN) 800 MG tablet take 1 tablet by oral route every 12 hours as needed    [provider]  loratadine (CLARITIN) 10 MG tablet Take 5 mg by mouth 2 (two) times daily.    [provider]  losartan (COZAAR) 50 MG tablet Take 25 mg by mouth 2 (two) times daily.    [provider]  meclizine (ANTIVERT) 25 MG tablet Take 1 tablet (25 mg total) by mouth 3 (three) times daily as needed for dizziness or nausea. 02/06/18   Virgel Manifold, MD  traZODone (DESYREL) 50 MG tablet Take 1 tablet (50 mg total) by mouth at bedtime. 12/28/17   Suella Broad, FNP    Family History Family History  Problem Relation Age of Onset  . Migraines Neg Hx     Social History Social History  Tobacco Use  . Smoking status: Former Smoker    Types: Cigarettes  . Smokeless tobacco: Never Used  Substance Use Topics  . Alcohol use: Yes    Comment: occ  . Drug use: Not Currently    Types: Cocaine    Comment: crack     Allergies   Clonopin [clonazepam]; Fish allergy; Flexeril [cyclobenzaprine hcl]; Ace inhibitors; and Tramadol   Review of Systems Review of Systems  All systems reviewed and negative, other than as noted in HPI.  Physical Exam Updated Vital Signs BP (!) 144/95   Pulse 81   Temp 98 F (36.7 C) (Oral)   Resp 14   Ht 4\' 11"  (1.499 m)   Wt 76.2 kg   SpO2 97%   BMI 33.93 kg/m   Physical Exam  Constitutional: She is oriented to person, place, and time. She appears well-developed and well-nourished. No distress.  HENT:  Head: Normocephalic and atraumatic.  Eyes: Pupils are equal, round, and reactive to light. Conjunctivae and EOM are  normal. Right eye exhibits no discharge. Left eye exhibits no discharge.  Neck: Neck supple.  Cardiovascular: Normal rate, regular rhythm and normal heart sounds. Exam reveals no gallop and no friction rub.  No murmur heard. Pulmonary/Chest: Effort normal and breath sounds normal. No respiratory distress.  Abdominal: Soft. She exhibits no distension. There is tenderness.  Mild epigastric tenderness without rebound or guarding.  No distention.  Musculoskeletal: She exhibits no edema or tenderness.  Neurological: She is alert and oriented to person, place, and time. She displays normal reflexes. No cranial nerve deficit. She exhibits normal muscle tone.  Good finger-to-nose testing bilaterally.  Skin: Skin is warm and dry.  Psychiatric: She has a normal mood and affect. Her behavior is normal. Thought content normal.  Nursing note and vitals reviewed.    ED Treatments / Results  Labs (all labs ordered are listed, but only abnormal results are displayed) Labs Reviewed  CBC WITH DIFFERENTIAL/PLATELET - Abnormal; Notable for the following components:      Result Value   WBC 13.3 (*)    RBC 3.74 (*)    Hemoglobin 11.7 (*)    Platelets 518 (*)    Neutro Abs 8.9 (*)    Abs Immature Granulocytes 0.16 (*)    All other components within normal limits  BASIC METABOLIC PANEL - Abnormal; Notable for the following components:   Glucose, Bld 102 (*)    Calcium 8.7 (*)    All other components within normal limits  POC OCCULT BLOOD, ED    EKG EKG Interpretation  Date/Time:  Wednesday February 06 2018 07:18:17 EDT Ventricular Rate:  94 PR Interval:    QRS Duration: 86 QT Interval:  361 QTC Calculation: 452 R Axis:   109 Text Interpretation:  Sinus rhythm Right axis deviation Confirmed by Virgel Manifold [id number]) on 02/06/2018 8:54:30 AM   Radiology No results found.  Procedures Procedures (including critical care time)  Medications Ordered in ED Medications - No data to  display   Initial Impression / Assessment and Plan / ED Course  I have reviewed the triage vital signs and the nursing notes.  Pertinent labs & imaging results that were available during my care of the patient were reviewed by me and considered in my medical decision making (see chart for details).  Clinical Course as of Feb 09 1758  Wed Feb 06, 2018  0846 Hemoglobin(!): 11.7 [GG]  0847 Hemoglobin(!): 11.7 [GG]    Clinical Course User Index [GG]  Boone Master    47 year old female symptoms consistent with vertigo.  Likely peripheral etiology.  I doubt central cause such as CVA or vertebrobasilar insufficiency.  Plan symptomatic treatment with meclizine.  Please maneuvers.  Also complains some burning in her chest and epigastrium.  Worse at night.  Consistent with GERD.  Will give a trial of H2 blocker.  Return precautions were discussed.  Needs outpatient follow-up otherwise.  Final Clinical Impressions(s) / ED Diagnoses   Final diagnoses:  Vertigo  Gastroesophageal reflux disease with esophagitis    ED Discharge Orders         Ordered    meclizine (ANTIVERT) 25 MG tablet  3 times daily PRN     02/06/18 0903    famotidine (PEPCID) 20 MG tablet  2 times daily     02/06/18 8502           Virgel Manifold, MD 02/09/18 [phone]

## 2018-02-13 ENCOUNTER — Other Ambulatory Visit (HOSPITAL_COMMUNITY): Payer: Self-pay | Admitting: Family Medicine

## 2018-02-13 DIAGNOSIS — Z1231 Encounter for screening mammogram for malignant neoplasm of breast: Secondary | ICD-10-CM

## 2018-02-20 ENCOUNTER — Encounter: Payer: Self-pay | Admitting: Adult Health

## 2018-02-20 ENCOUNTER — Other Ambulatory Visit (HOSPITAL_COMMUNITY)
Admission: RE | Admit: 2018-02-20 | Discharge: 2018-02-20 | Disposition: A | Discharge status: Home / Self Care | Payer: Medicaid Other | Source: Ambulatory Visit | Attending: Adult Health | Admitting: Adult Health

## 2018-02-20 ENCOUNTER — Ambulatory Visit: Payer: Medicaid Other | Admitting: Adult Health

## 2018-02-20 ENCOUNTER — Encounter: Payer: Self-pay | Admitting: *Deleted

## 2018-02-20 ENCOUNTER — Encounter (INDEPENDENT_AMBULATORY_CARE_PROVIDER_SITE_OTHER): Payer: Self-pay

## 2018-02-20 VITALS — BP 123/83 | HR 95 | Ht 58.5 in | Wt 181.5 lb

## 2018-02-20 DIAGNOSIS — Z01419 Encounter for gynecological examination (general) (routine) without abnormal findings: Secondary | ICD-10-CM | POA: Insufficient documentation

## 2018-02-20 DIAGNOSIS — Z1211 Encounter for screening for malignant neoplasm of colon: Secondary | ICD-10-CM

## 2018-02-20 DIAGNOSIS — Z124 Encounter for screening for malignant neoplasm of cervix: Secondary | ICD-10-CM | POA: Diagnosis not present

## 2018-02-20 DIAGNOSIS — Z1212 Encounter for screening for malignant neoplasm of rectum: Secondary | ICD-10-CM

## 2018-02-20 DIAGNOSIS — N852 Hypertrophy of uterus: Secondary | ICD-10-CM | POA: Insufficient documentation

## 2018-02-20 LAB — HEMOCCULT GUIAC POC 1CARD (OFFICE): Fecal Occult Blood, POC: NEGATIVE

## 2018-02-20 NOTE — Progress Notes (Signed)
  Subjective:     Patient ID: Kathryn [name], female   DOB: 1970/11/07, 47 y.o.   MRN: [id number]  HPI Kathryn [name] is a 47 year old black female in for pap and pelvic.She had physical with Dr Mannie Stabile and now sees Dr Maudie Mercury as PCP.   Review of Systems Patient denies any headaches, hearing loss, fatigue, blurred vision, chest pain, abdominal pain, problems with bowel movements, urination, or intercourse. No joint pain or mood swings.Still having periods.  Was seen in ER recently for vertigo.  Has had shortness of breath with walking but uses inhaler and is better, just happened this week, has asthma, she says.  Reviewed past medical,surgical, social and family history. Reviewed medications and allergies.     Objective:   Physical Exam BP 123/83 (BP Location: Left Arm, Patient Position: Sitting, Cuff Size: Normal)   Pulse 95   Ht 4' 10.5" (1.486 m)   Wt 181 lb 8 oz (82.3 kg)   LMP 01/19/2018   BMI 37.29 kg/m   Skin warm and dry.Pelvic: external genitalia is normal in appearance no lesions, vagina: white discharge without odor, pale pink with good moisture and rugae,urethra has no lesions or masses noted, cervix:smooth and bulbous,pap with HPV and GC/CHL performed,  uterus: feels enlarged, slightly tender, no masses felt, adnexa: no masses or tenderness noted. Bladder is non tender and no masses felt.  On rectal exam has good tone, no masses felt and hemoccult is negative. She has band aide on left knee and has had fluid drawn off and injected with steroid, she says.  PHQ 9 score 15, is on meds and denies being suicidal, has been seen a [employer]. She had HIV,RPR and hepatitis done in January 2019 and were normal.   Examination chaperoned by Levy Pupa LPN.  Will get [name] to assess uterus, explained to her could be fibroid, and also discussed symptoms of menopause. Face time 30 minutes with 50% counseling and coordinating care.      Assessment:     1. Encounter for gynecological  examination with Papanicolaou smear of cervix   2. Enlarged uterus   3. Routine cervical smear   4. Screening for colorectal cancer       Plan:    Pap with GC/CHL and HPV sent  Return in about 2 weeks for GYN [name] Pap in 3 years if normal Will talk when [name] results back  F/U with Dr Maudie Mercury.

## 2018-02-22 LAB — CYTOLOGY - PAP
Chlamydia: NEGATIVE
Diagnosis: NEGATIVE
HPV (WINDOPATH): NOT DETECTED
Neisseria Gonorrhea: NEGATIVE

## 2018-02-27 ENCOUNTER — Telehealth: Payer: Self-pay | Admitting: *Deleted

## 2018-02-27 ENCOUNTER — Other Ambulatory Visit: Payer: Self-pay | Admitting: Women's Health

## 2018-02-27 MED ORDER — FLUCONAZOLE 150 MG PO TABS: 150.0000 mg | ORAL_TABLET | Freq: Once | ORAL | 0 refills | Status: AC

## 2018-02-27 NOTE — Telephone Encounter (Signed)
-----   Message from Roma Schanz, [place] sent at 02/26/2018  1:27 PM EST ----- Please let her know pap normal, but w/ yeast. Can take otc yeast cream or I can send in diflucan if she likes, just let me know

## 2018-02-27 NOTE — Telephone Encounter (Signed)
Called patient and informed her of positive yeast infection and normal pap. She prefers to have diflucan sent in.

## 2018-03-01 ENCOUNTER — Ambulatory Visit (HOSPITAL_COMMUNITY): Payer: Self-pay

## 2018-03-06 ENCOUNTER — Other Ambulatory Visit: Payer: Medicaid Other

## 2018-03-08 ENCOUNTER — Other Ambulatory Visit: Payer: Medicaid Other

## 2018-03-21 ENCOUNTER — Emergency Department (HOSPITAL_COMMUNITY): Payer: Medicaid Other

## 2018-03-21 ENCOUNTER — Emergency Department (HOSPITAL_COMMUNITY)
Admission: EM | Admit: 2018-03-21 | Discharge: 2018-03-21 | Disposition: A | Discharge status: Home / Self Care | Payer: Medicaid Other | Attending: Emergency Medicine | Admitting: Emergency Medicine

## 2018-03-21 ENCOUNTER — Other Ambulatory Visit: Payer: Self-pay

## 2018-03-21 ENCOUNTER — Encounter (HOSPITAL_COMMUNITY): Payer: Self-pay | Admitting: *Deleted

## 2018-03-21 DIAGNOSIS — R1011 Right upper quadrant pain: Secondary | ICD-10-CM | POA: Diagnosis present

## 2018-03-21 DIAGNOSIS — Z87891 Personal history of nicotine dependence: Secondary | ICD-10-CM | POA: Diagnosis not present

## 2018-03-21 DIAGNOSIS — J45909 Unspecified asthma, uncomplicated: Secondary | ICD-10-CM | POA: Insufficient documentation

## 2018-03-21 DIAGNOSIS — Z79899 Other long term (current) drug therapy: Secondary | ICD-10-CM | POA: Diagnosis not present

## 2018-03-21 DIAGNOSIS — R1010 Upper abdominal pain, unspecified: Secondary | ICD-10-CM

## 2018-03-21 DIAGNOSIS — I10 Essential (primary) hypertension: Secondary | ICD-10-CM | POA: Diagnosis not present

## 2018-03-21 LAB — COMPREHENSIVE METABOLIC PANEL
ALBUMIN: 3.5 g/dL (ref 3.5–5.0)
ALT: 27 U/L (ref 0–44)
AST: 18 U/L (ref 15–41)
Alkaline Phosphatase: 64 U/L (ref 38–126)
Anion gap: 7 (ref 5–15)
BILIRUBIN TOTAL: 0.1 mg/dL — AB (ref 0.3–1.2)
BUN: 30 mg/dL — AB (ref 6–20)
CO2: 24 mmol/L (ref 22–32)
CREATININE: 1.35 mg/dL — AB (ref 0.44–1.00)
Calcium: 9 mg/dL (ref 8.9–10.3)
Chloride: 107 mmol/L (ref 98–111)
GFR calc Af Amer: 54 mL/min — ABNORMAL LOW (ref >60)
GFR, EST NON AFRICAN AMERICAN: 46 mL/min — AB (ref >60)
GLUCOSE: 132 mg/dL — AB (ref 70–99)
POTASSIUM: 4.4 mmol/L (ref 3.5–5.1)
Sodium: 138 mmol/L (ref 135–145)
TOTAL PROTEIN: 6.8 g/dL (ref 6.5–8.1)

## 2018-03-21 LAB — I-STAT BETA HCG BLOOD, ED (MC, WL, AP ONLY): I-stat hCG, quantitative: <5 m[IU]/mL (ref <5)

## 2018-03-21 LAB — URINALYSIS, ROUTINE W REFLEX MICROSCOPIC
Bilirubin Urine: NEGATIVE
Glucose, UA: NEGATIVE mg/dL
HGB URINE DIPSTICK: NEGATIVE
Ketones, ur: NEGATIVE mg/dL
Leukocytes, UA: NEGATIVE
NITRITE: NEGATIVE
PH: 6 (ref 5.0–8.0)
Protein, ur: NEGATIVE mg/dL
SPECIFIC GRAVITY, URINE: 1.039 — AB (ref 1.005–1.030)

## 2018-03-21 LAB — CBC WITH DIFFERENTIAL/PLATELET
ABS IMMATURE GRANULOCYTES: 0.71 10*3/uL — AB (ref 0.00–0.07)
BASOS ABS: 0.1 10*3/uL (ref 0.0–0.1)
Basophils Relative: 1 %
EOS ABS: 0 10*3/uL (ref 0.0–0.5)
Eosinophils Relative: 0 %
HEMATOCRIT: 34.7 % — AB (ref 36.0–46.0)
HEMOGLOBIN: 11 g/dL — AB (ref 12.0–15.0)
Immature Granulocytes: 4 %
LYMPHS ABS: 2.4 10*3/uL (ref 0.7–4.0)
LYMPHS PCT: 14 %
MCH: 30.7 pg (ref 26.0–34.0)
MCHC: 31.7 g/dL (ref 30.0–36.0)
MCV: 96.9 fL (ref 80.0–100.0)
MONOS PCT: 6 %
Monocytes Absolute: 1 10*3/uL (ref 0.1–1.0)
NEUTROS ABS: 12.7 10*3/uL — AB (ref 1.7–7.7)
NEUTROS PCT: 75 %
NRBC: 0 % (ref 0.0–0.2)
Platelets: 519 10*3/uL — ABNORMAL HIGH (ref 150–400)
RBC: 3.58 MIL/uL — ABNORMAL LOW (ref 3.87–5.11)
RDW: 13.8 % (ref 11.5–15.5)
WBC: 16.9 10*3/uL — ABNORMAL HIGH (ref 4.0–10.5)

## 2018-03-21 LAB — LIPASE, BLOOD: LIPASE: 46 U/L (ref 11–51)

## 2018-03-21 MED ORDER — IOPAMIDOL (ISOVUE-300) INJECTION 61%
100.0000 mL | Freq: Once | INTRAVENOUS | Status: AC | PRN
  Administered 2018-03-21: 100 mL via INTRAVENOUS

## 2018-03-21 MED ORDER — PANTOPRAZOLE SODIUM 20 MG PO TBEC: 20.0000 mg | DELAYED_RELEASE_TABLET | Freq: Every day | ORAL | 0 refills | Status: DC

## 2018-03-21 MED ORDER — SODIUM CHLORIDE 0.9 % IV BOLUS
1000.0000 mL | Freq: Once | INTRAVENOUS | Status: AC
  Administered 2018-03-21: 1000 mL via INTRAVENOUS

## 2018-03-21 MED ORDER — ONDANSETRON 4 MG PO TBDP: ORAL_TABLET | ORAL | 0 refills | Status: DC

## 2018-03-21 MED ORDER — HYDROCODONE-ACETAMINOPHEN 5-325 MG PO TABS: ORAL_TABLET | ORAL | 0 refills | Status: DC

## 2018-03-21 MED ORDER — ONDANSETRON HCL 4 MG/2ML IJ SOLN
4.0000 mg | Freq: Once | INTRAMUSCULAR | Status: AC
  Administered 2018-03-21: 4 mg via INTRAVENOUS
  Filled 2018-03-21: qty 2

## 2018-03-21 MED ORDER — HYDROMORPHONE HCL 1 MG/ML IJ SOLN
1.0000 mg | Freq: Once | INTRAMUSCULAR | Status: AC
  Administered 2018-03-21: 1 mg via INTRAVENOUS
  Filled 2018-03-21: qty 1

## 2018-03-21 MED ORDER — PANTOPRAZOLE SODIUM 40 MG IV SOLR
40.0000 mg | Freq: Once | INTRAVENOUS | Status: AC
  Administered 2018-03-21: 40 mg via INTRAVENOUS
  Filled 2018-03-21: qty 40

## 2018-03-21 NOTE — ED Notes (Signed)
Advised patient not to drive after discharge due to narcotic medication administration. Also advised patient not to drive while taking prescription pain medication. Patient verbalized understanding. Discharged with significant other to drive her home.

## 2018-03-21 NOTE — Discharge Instructions (Addendum)
Follow-up with your family doctor next week for recheck return sooner if problems

## 2018-03-21 NOTE — ED Provider Notes (Signed)
[facility] EMERGENCY DEPARTMENT Provider Note   CSN: [id number] Arrival date & time: 03/21/18  6063     History   Chief Complaint Chief Complaint  Patient presents with  . Abdominal Pain    HPI Kathryn [name] is a 47 y.o. female.  Patient complains of abdominal pain.  She has right upper quadrant pain with nausea  The history is provided by the patient. No language interpreter was used.  Abdominal Pain   This is a new problem. The current episode started 12 to 24 hours ago. The problem occurs constantly. The problem has not changed since onset.The pain is associated with an unknown factor. The pain is located in the RUQ. The quality of the pain is aching. The pain is at a severity of 5/10. The pain is moderate. Associated symptoms include anorexia. Pertinent negatives include diarrhea, frequency, hematuria and headaches. Nothing aggravates the symptoms. Nothing relieves the symptoms.    Past Medical History:  Diagnosis Date  . Asthma   . Chronic back pain   . Hypertension   . Neuropathy     Patient Active Problem List   Diagnosis Date Noted  . Enlarged uterus 02/20/2018  . Screening for colorectal cancer 02/20/2018  . Routine cervical smear 02/20/2018  . Encounter for gynecological examination with Papanicolaou smear of cervix 02/20/2018  . Osteoarthritis of left knee 12/27/2017  . MDD (major depressive disorder), recurrent episode, severe ([place]) 12/24/2017  . Lumbar back pain 05/11/2017  . Depression, major, single episode, moderate ([place]) 05/11/2017  . Low vitamin B12 level 05/11/2017  . Immunization due 05/11/2017  . Hypertension goal BP (blood pressure) < 140/90 01/11/2017    Past Surgical History:  Procedure Laterality Date  . TUBAL LIGATION       OB History    Gravida  5   Para  3   Term  2   Preterm  1   AB  2   Living  3     SAB  1   TAB  1   Ectopic      Multiple      Live Births               Home Medications    Prior to  Admission medications   Medication Sig Start Date End Date Taking? Authorizing Provider  albuterol (PROVENTIL HFA;VENTOLIN HFA) 108 (90 Base) MCG/ACT inhaler Inhale 2 puffs into the lungs 2 (two) times daily.   Yes [provider]  albuterol (PROVENTIL) (2.5 MG/3ML) 0.083% nebulizer solution Take 2.5 mg by nebulization every 6 (six) hours as needed for wheezing or shortness of breath.   Yes [provider]  DULoxetine (CYMBALTA) 60 MG capsule Take 1 capsule (60 mg total) by mouth daily. 12/29/17  Yes Starkes-Perry, Gayland Curry, FNP  famotidine (PEPCID) 20 MG tablet Take 1 tablet (20 mg total) by mouth 2 (two) times daily. 02/06/18  Yes Virgel Manifold, MD  gabapentin (NEURONTIN) 300 MG capsule Take 1 capsule (300 mg total) by mouth 3 (three) times daily. 12/28/17  Yes Starkes-Perry, Gayland Curry, FNP  hydrochlorothiazide (MICROZIDE) 12.5 MG capsule Take 12.5 mg by mouth daily.   Yes [provider]  loratadine (CLARITIN) 10 MG tablet Take 5 mg by mouth 2 (two) times daily.   Yes [provider]  losartan (COZAAR) 50 MG tablet Take 25 mg by mouth 2 (two) times daily.   Yes [provider]  meclizine (ANTIVERT) 25 MG tablet Take 1 tablet (25 mg total) by  mouth 3 (three) times daily as needed for dizziness or nausea. 02/06/18  Yes Virgel Manifold, MD  traZODone (DESYREL) 50 MG tablet Take 1 tablet (50 mg total) by mouth at bedtime. 12/28/17  Yes Starkes-Perry, Gayland Curry, FNP  HYDROcodone-acetaminophen (NORCO/VICODIN) 5-325 MG tablet Take 1 every 6 hours for pain not relieved by Tylenol alone 03/21/18   Milton Ferguson, MD  ondansetron (ZOFRAN ODT) 4 MG disintegrating tablet 4mg  ODT q4 hours prn nausea/vomit 03/21/18   Milton Ferguson, MD  pantoprazole (PROTONIX) 20 MG tablet Take 1 tablet (20 mg total) by mouth daily. 03/21/18   Milton Ferguson, MD    Family History Family History  Problem Relation Age of Onset  . Gout Paternal Grandfather   . Cirrhosis Paternal Grandfather     . Hypertension Paternal Grandmother   . Aneurysm Paternal Grandmother   . Cirrhosis Maternal Grandmother   . Cirrhosis Maternal Grandfather   . Cancer Father   . Cirrhosis Father   . Cirrhosis Mother   . Breast cancer Sister   . Hypertension Sister   . Bronchitis Daughter   . Bronchitis Daughter   . Asthma Son   . Bronchitis Son   . Migraines Neg Hx     Social History Social History   Tobacco Use  . Smoking status: Former Smoker    Types: Cigarettes  . Smokeless tobacco: Never Used  Substance Use Topics  . Alcohol use: Yes    Comment: occ  . Drug use: Not Currently    Types: Cocaine    Comment: crack     Allergies   Clonopin [clonazepam]; Fish allergy; Flexeril [cyclobenzaprine hcl]; Ace inhibitors; and Tramadol   Review of Systems Review of Systems  Constitutional: Negative for appetite change and fatigue.  HENT: Negative for congestion, ear discharge and sinus pressure.   Eyes: Negative for discharge.  Respiratory: Negative for cough.   Cardiovascular: Negative for chest pain.  Gastrointestinal: Positive for abdominal pain and anorexia. Negative for diarrhea.  Genitourinary: Negative for frequency and hematuria.  Musculoskeletal: Negative for back pain.  Skin: Negative for rash.  Neurological: Negative for seizures and headaches.  Psychiatric/Behavioral: Negative for hallucinations.     Physical Exam Updated Vital Signs BP 137/83   Pulse 82   Temp 98.4 F (36.9 C) (Oral)   Resp 18   Ht 4\' 11"  (1.499 m)   Wt 86.2 kg   LMP 03/07/2018   SpO2 98%   BMI 38.38 kg/m   Physical Exam  Constitutional: She is oriented to person, place, and time. She appears well-developed.  HENT:  Head: Normocephalic.  Eyes: Conjunctivae and EOM are normal. No scleral icterus.  Neck: Neck supple. No thyromegaly present.  Cardiovascular: Normal rate and regular rhythm. Exam reveals no gallop and no friction rub.  No murmur heard. Pulmonary/Chest: No stridor. She has  no wheezes. She has no rales. She exhibits no tenderness.  Abdominal: She exhibits no distension. There is tenderness. There is no rebound.  Tender right upper quadrant  Musculoskeletal: Normal range of motion. She exhibits no edema.  Lymphadenopathy:    She has no cervical adenopathy.  Neurological: She is oriented to person, place, and time. She exhibits normal muscle tone. Coordination normal.  Skin: No rash noted. No erythema.  Psychiatric: She has a normal mood and affect. Her behavior is normal.     ED Treatments / Results  Labs (all labs ordered are listed, but only abnormal results are displayed) Labs Reviewed  CBC WITH DIFFERENTIAL/PLATELET - Abnormal; Notable  for the following components:      Result Value   WBC 16.9 (*)    RBC 3.58 (*)    Hemoglobin 11.0 (*)    HCT 34.7 (*)    Platelets 519 (*)    Neutro Abs 12.7 (*)    Abs Immature Granulocytes 0.71 (*)    All other components within normal limits  COMPREHENSIVE METABOLIC PANEL - Abnormal; Notable for the following components:   Glucose, Bld 132 (*)    BUN 30 (*)    Creatinine, Ser 1.35 (*)    Total Bilirubin 0.1 (*)    GFR calc non Af Amer 46 (*)    GFR calc Af Amer 54 (*)    All other components within normal limits  URINALYSIS, ROUTINE W REFLEX MICROSCOPIC - Abnormal; Notable for the following components:   Specific Gravity, Urine 1.039 (*)    All other components within normal limits  LIPASE, BLOOD  I-STAT BETA HCG BLOOD, ED ([facility], [facility], [facility] ONLY)    EKG None  Radiology US Abdomen Complete  Result Date: 03/21/2018 CLINICAL DATA:  Abdominal pain for 2 days EXAM: ABDOMEN ULTRASOUND COMPLETE COMPARISON:  08/05/2014. FINDINGS: Gallbladder: No gallstones or wall thickening visualized. No sonographic Murphy sign noted by sonographer. Common bile duct: Diameter: 4.2 mm Liver: Focal 2 cm hyperechoic lesion within the right lobe of the liver most consistent with hemangioma. This corresponds to a hypodensity seen on a  CT examination from 2016. second smaller 2 cm lesion is noted in the region of the dome of the liver in the left lobe. This corresponds to a an additional hypodensity in also likely represents a small hemangioma. There stable from the previous exam. Portal vein is patent on color Doppler imaging with normal direction of blood flow towards the liver. IVC: No abnormality visualized. Pancreas: Visualized portion unremarkable. Spleen: Size and appearance within normal limits. Right Kidney: Length: 9.8 cm. Echogenicity within normal limits. No mass or hydronephrosis visualized. Left Kidney: Length: 10.6 cm. Echogenicity within normal limits. No mass or hydronephrosis visualized. Abdominal aorta: Not well visualized due to overlying bowel gas. Other findings: None. IMPRESSION: Hyperechoic foci within the liver consistent with hemangiomas. These correspond to hypodensity seen on prior CT in 2016. No other focal abnormality is noted. Electronically Signed   By: Inez Catalina M.D.   On: 03/21/2018 09:48   Ct Abdomen Pelvis W Contrast  Result Date: 03/21/2018 CLINICAL DATA:  Upper abdominal distension and pain. EXAM: CT ABDOMEN AND PELVIS WITH CONTRAST TECHNIQUE: Multidetector CT imaging of the abdomen and pelvis was performed using the standard protocol following bolus administration of intravenous contrast. CONTRAST:  131mL ISOVUE-300 IOPAMIDOL (ISOVUE-300) INJECTION 61% COMPARISON:  Abdominal ultrasound 03/21/2018, body CT 08/05/2014 FINDINGS: Lower chest: No acute abnormality. Hepatobiliary: 2 hypoechoic masses with post thin peripheral enhancement in the dome of the liver measure 11 and 12 mm. A third subcapsular mass measures 2.0 cm. The gallbladder is normal. Pancreas: Unremarkable. No pancreatic ductal dilatation or surrounding inflammatory changes. Spleen: Normal in size without focal abnormality. Adrenals/Urinary Tract: Adrenal glands are unremarkable. Kidneys are normal, without renal calculi, focal lesion, or  hydronephrosis. Bladder is unremarkable. Stomach/Bowel: Stomach is within normal limits. Appendix appears normal. No evidence of bowel wall thickening, distention, or inflammatory changes. Nonspecific sub pathologic gaseous distension of the colon. Vascular/Lymphatic: No significant vascular findings are present. No enlarged abdominal or pelvic lymph nodes. Reproductive: Uterus and bilateral adnexa are unremarkable. Other: No abdominal wall hernia or abnormality. No abdominopelvic ascites. Musculoskeletal: No  acute or significant osseous findings. IMPRESSION: No evidence of acute findings within the abdomen or pelvis. Nonspecific sub pathologic gaseous distension of the colon may account for patient's symptoms. Three hypoattenuated liver masses, mostly stable accounting for differences in technique from the prior noncontrasted CT dated 08/05/2014. In the absence of history of chronic liver disease or malignancy capable of producing liver lesions, and given their hyperechoic appearance on prior comparison abdominal ultrasound, these likely represent hemangiomas. Follow-up with right upper quadrant ultrasound in 3-6 months is recommended to assure stability. Electronically Signed   By: Fidela Salisbury M.D.   On: 03/21/2018 12:18    Procedures Procedures (including critical care time)  Medications Ordered in ED Medications  HYDROmorphone (DILAUDID) injection 1 mg (1 mg Intravenous Given 03/21/18 0820)  ondansetron (ZOFRAN) injection 4 mg (4 mg Intravenous Given 03/21/18 0819)  sodium chloride 0.9 % bolus 1,000 mL (0 mLs Intravenous Stopped 03/21/18 1011)  pantoprazole (PROTONIX) injection 40 mg (40 mg Intravenous Given 03/21/18 0819)  HYDROmorphone (DILAUDID) injection 1 mg (1 mg Intravenous Given 03/21/18 0951)  sodium chloride 0.9 % bolus 1,000 mL (1,000 mLs Intravenous New Bag/Given 03/21/18 1057)  iopamidol (ISOVUE-300) 61 % injection 100 mL (100 mLs Intravenous Contrast Given 03/21/18 1026)      Initial Impression / Assessment and Plan / ED Course  I have reviewed the triage vital signs and the nursing notes.  Pertinent labs & imaging results that were available during my care of the patient were reviewed by me and considered in my medical decision making (see chart for details).    Labs show patient be dehydrated.  Ultrasound and CT show some small liver abnormalities that the recommendation is to get a repeat ultrasound in 3 to 6 months.  Patient will be treated for peptic ulcer problems with Protonix and she is given Zofran and a few Vicodin.  She will follow-up with her PCP next week and has been instructed to get another ultrasound in 3 to 6 months  Final Clinical Impressions(s) / ED Diagnoses   Final diagnoses:  Pain of upper abdomen    ED Discharge Orders         Ordered    pantoprazole (PROTONIX) 20 MG tablet  Daily     03/21/18 1239    ondansetron (ZOFRAN ODT) 4 MG disintegrating tablet     03/21/18 1239    HYDROcodone-acetaminophen (NORCO/VICODIN) 5-325 MG tablet     03/21/18 1239           Milton Ferguson, MD 03/21/18 1243

## 2018-03-21 NOTE — ED Triage Notes (Signed)
Pt c/o upper abdominal pain that started last night; pt states her last BM was this am and it was normal

## 2018-03-24 ENCOUNTER — Other Ambulatory Visit: Payer: Self-pay

## 2018-03-24 ENCOUNTER — Encounter (HOSPITAL_COMMUNITY): Payer: Self-pay | Admitting: Emergency Medicine

## 2018-03-24 ENCOUNTER — Emergency Department (HOSPITAL_COMMUNITY)
Admission: EM | Admit: 2018-03-24 | Discharge: 2018-03-24 | Disposition: A | Discharge status: Home / Self Care | Payer: Medicaid Other | Attending: Emergency Medicine | Admitting: Emergency Medicine

## 2018-03-24 DIAGNOSIS — K219 Gastro-esophageal reflux disease without esophagitis: Secondary | ICD-10-CM | POA: Insufficient documentation

## 2018-03-24 DIAGNOSIS — Z87891 Personal history of nicotine dependence: Secondary | ICD-10-CM | POA: Diagnosis not present

## 2018-03-24 DIAGNOSIS — Z79899 Other long term (current) drug therapy: Secondary | ICD-10-CM | POA: Insufficient documentation

## 2018-03-24 DIAGNOSIS — J45909 Unspecified asthma, uncomplicated: Secondary | ICD-10-CM | POA: Diagnosis not present

## 2018-03-24 DIAGNOSIS — K140 Glossitis: Secondary | ICD-10-CM | POA: Diagnosis not present

## 2018-03-24 DIAGNOSIS — J029 Acute pharyngitis, unspecified: Secondary | ICD-10-CM | POA: Diagnosis present

## 2018-03-24 MED ORDER — OMEPRAZOLE 20 MG PO CPDR: DELAYED_RELEASE_CAPSULE | ORAL | 0 refills | Status: DC

## 2018-03-24 MED ORDER — FAMOTIDINE 20 MG PO TABS
20.0000 mg | ORAL_TABLET | Freq: Once | ORAL | Status: AC
  Administered 2018-03-24: 20 mg via ORAL
  Filled 2018-03-24: qty 1

## 2018-03-24 MED ORDER — ALUM & MAG HYDROXIDE-SIMETH 200-200-20 MG/5ML PO SUSP
30.0000 mL | Freq: Once | ORAL | Status: AC
  Administered 2018-03-24: 30 mL via ORAL
  Filled 2018-03-24: qty 30

## 2018-03-24 NOTE — ED Triage Notes (Signed)
Patient states she feels like she may be having an allergic reaction to new medication. Patient states a rash and that her tongue had swelling along with her lips. No obvious swelling noted. Patient states tongue is sore. Patient is taking Zofran and hydrocodone from her last ER visit. Patient said some of these symptoms started on Tuesday.

## 2018-03-24 NOTE — Discharge Instructions (Signed)
You have inflamed taste buds (papilla) on the end of your tongue. It is painful and lasts 1-2 weeks. There is no treatment for it. You can use salt water gargles for comfort and drink a lot of fluids. Gargle with mouth wash. Avoid eating spicy foods. Start the prilosec to see if that helps with your abdominal pain and the burning in your throat and mouth.

## 2018-03-24 NOTE — ED Provider Notes (Signed)
[facility] EMERGENCY DEPARTMENT Provider Note   CSN: [id number] Arrival date & time: 03/24/18  0455  Time seen 5:35 AM   History   Chief Complaint Chief Complaint  Patient presents with  . Rash    HPI Kathryn [name] is a 47 y.o. female.  HPI patient states she just got out of [facility] the old [facility] in [place] on December 3 after 12 days.  She states she was admitted there for substance abuse.  She states the only new medicine they started for her was Robaxin.  She was seen in the ED on December 5 for abdominal pain.  She was prescribed Norco and Zofran.  She states about an hour after she left the ED she ate something and had a "funny taste" in her mouth.  However then she tells me she has no change in the taste in her mouth.  She tells me she has a burning sensation in her throat and tongue and then although denying having a metal taste in her mouth she later told me she did have a metal taste in her mouth.  She denies any lesions in her mouth but states underneath her lip she feels like it is raw and irritated.  She states this morning she was brushing her teeth and she brushed the top of her tongue and her tongue was bleeding.  She states in the shower this morning she thought her feet look swollen.  She states they have never been swollen before.  She states she felt short of breath this morning and she is not been treated for breathing trouble before.  She denies nausea, vomiting, or diarrhea.  She denies being on any new antibiotics and the medication she was started on recently she has been on in the past.  She states her throat hurts when she swallows but she denies fever.  She denies any rash.  Patient denies doing any drugs since she left rehab.  PCP Jani Gravel, MD   Past Medical History:  Diagnosis Date  . Asthma   . Chronic back pain   . Hypertension   . Neuropathy     Patient Active Problem List   Diagnosis Date Noted  . Enlarged uterus  02/20/2018  . Screening for colorectal cancer 02/20/2018  . Routine cervical smear 02/20/2018  . Encounter for gynecological examination with Papanicolaou smear of cervix 02/20/2018  . Osteoarthritis of left knee 12/27/2017  . MDD (major depressive disorder), recurrent episode, severe ([place]) 12/24/2017  . Lumbar back pain 05/11/2017  . Depression, major, single episode, moderate ([place]) 05/11/2017  . Low vitamin B12 level 05/11/2017  . Immunization due 05/11/2017  . Hypertension goal BP (blood pressure) < 140/90 01/11/2017    Past Surgical History:  Procedure Laterality Date  . TUBAL LIGATION       OB History    Gravida  5   Para  3   Term  2   Preterm  1   AB  2   Living  3     SAB  1   TAB  1   Ectopic      Multiple      Live Births               Home Medications    Prior to Admission medications   Medication Sig Start Date End Date Taking? Authorizing Provider  albuterol (PROVENTIL HFA;VENTOLIN HFA) 108 (90 Base) MCG/ACT inhaler Inhale 2 puffs into the lungs 2 (  two) times daily.    [provider]  albuterol (PROVENTIL) (2.5 MG/3ML) 0.083% nebulizer solution Take 2.5 mg by nebulization every 6 (six) hours as needed for wheezing or shortness of breath.    [provider]  DULoxetine (CYMBALTA) 60 MG capsule Take 1 capsule (60 mg total) by mouth daily. 12/29/17   Starkes-Perry, Gayland Curry, FNP  famotidine (PEPCID) 20 MG tablet Take 1 tablet (20 mg total) by mouth 2 (two) times daily. 02/06/18   Virgel Manifold, MD  gabapentin (NEURONTIN) 300 MG capsule Take 1 capsule (300 mg total) by mouth 3 (three) times daily. 12/28/17   Starkes-Perry, Gayland Curry, FNP  hydrochlorothiazide (MICROZIDE) 12.5 MG capsule Take 12.5 mg by mouth daily.    [provider]  HYDROcodone-acetaminophen (NORCO/VICODIN) 5-325 MG tablet Take 1 every 6 hours for pain not relieved by Tylenol alone 03/21/18   Milton Ferguson, MD  loratadine (CLARITIN) 10 MG tablet Take 5 mg by  mouth 2 (two) times daily.    [provider]  losartan (COZAAR) 50 MG tablet Take 25 mg by mouth 2 (two) times daily.    [provider]  meclizine (ANTIVERT) 25 MG tablet Take 1 tablet (25 mg total) by mouth 3 (three) times daily as needed for dizziness or nausea. 02/06/18   Virgel Manifold, MD  omeprazole (PRILOSEC) 20 MG capsule Take 1 po BID x 2 weeks then once a day 03/24/18   Rolland Porter, MD  ondansetron (ZOFRAN ODT) 4 MG disintegrating tablet 4mg  ODT q4 hours prn nausea/vomit 03/21/18   Milton Ferguson, MD  pantoprazole (PROTONIX) 20 MG tablet Take 1 tablet (20 mg total) by mouth daily. 03/21/18   Milton Ferguson, MD  traZODone (DESYREL) 50 MG tablet Take 1 tablet (50 mg total) by mouth at bedtime. 12/28/17   Suella Broad, FNP    Family History Family History  Problem Relation Age of Onset  . Gout Paternal Grandfather   . Cirrhosis Paternal Grandfather   . Hypertension Paternal Grandmother   . Aneurysm Paternal Grandmother   . Cirrhosis Maternal Grandmother   . Cirrhosis Maternal Grandfather   . Cancer Father   . Cirrhosis Father   . Cirrhosis Mother   . Breast cancer Sister   . Hypertension Sister   . Bronchitis Daughter   . Bronchitis Daughter   . Asthma Son   . Bronchitis Son   . Migraines Neg Hx     Social History Social History   Tobacco Use  . Smoking status: Former Smoker    Types: Cigarettes  . Smokeless tobacco: Never Used  Substance Use Topics  . Alcohol use: Yes    Comment: occ  . Drug use: Not Currently    Types: Cocaine    Comment: crack  On disability for her left knee   Allergies   Clonopin [clonazepam]; Fish allergy; Flexeril [cyclobenzaprine hcl]; Ace inhibitors; and Tramadol   Review of Systems Review of Systems  All other systems reviewed and are negative.    Physical Exam Updated Vital Signs BP 115/84   Pulse (!) 101   Temp 98 F (36.7 C) (Oral)   Resp 18   Ht 4\' 11"  (1.499 m)   Wt 85.7 kg   LMP  03/07/2018   SpO2 98%   BMI 38.17 kg/m   Vital signs normal except for tachycardia   Physical Exam  Constitutional: She is oriented to person, place, and time. She appears well-developed and well-nourished.  Non-toxic appearance. She does not appear ill. No  distress.  HENT:  Head: Normocephalic and atraumatic.  Right Ear: External ear normal.  Left Ear: External ear normal.  Nose: Nose normal. No mucosal edema or rhinorrhea.  Mouth/Throat: Oropharynx is clear and moist and mucous membranes are normal. No dental abscesses or uvula swelling.  I do not see any oral lesions, there are no ulcers when I look underneath her lips or on her gums.  Her tongue appears normal with maybe some prominent papillae towards the end of her tongue and she states that is where her tongue was bleeding.  There is no active bleeding at this point.  Eyes: Pupils are equal, round, and reactive to light. Conjunctivae and EOM are normal.  Neck: Normal range of motion and full passive range of motion without pain. Neck supple.  Cardiovascular: Normal rate, regular rhythm and normal heart sounds. Exam reveals no gallop and no friction rub.  No murmur heard. Pulmonary/Chest: Effort normal and breath sounds normal. No respiratory distress. She has no wheezes. She has no rhonchi. She has no rales. She exhibits no tenderness and no crepitus.  Abdominal: Soft. Normal appearance and bowel sounds are normal. She exhibits no distension. There is no tenderness. There is no rebound and no guarding.  Musculoskeletal: Normal range of motion. She exhibits no edema or tenderness.  Moves all extremities well.  Patient's feet look puffy however there is no pitting edema.  Neurological: She is alert and oriented to person, place, and time. She has normal strength. No cranial nerve deficit.  Skin: Skin is warm, dry and intact. No rash noted. No erythema. No pallor.  Psychiatric: She has a normal mood and affect. Her speech is normal and  behavior is normal. Her mood appears not anxious.  Nursing note and vitals reviewed.      ED Treatments / Results  Labs (all labs ordered are listed, but only abnormal results are displayed) Labs Reviewed - No data to display  EKG None  Radiology No results found.  Procedures Procedures (including critical care time)  Medications Ordered in ED Medications  alum & mag hydroxide-simeth (MAALOX/MYLANTA) 200-200-20 MG/5ML suspension 30 mL (30 mLs Oral Given 03/24/18 0556)  famotidine (PEPCID) tablet 20 mg (20 mg Oral Given 03/24/18 0556)     Initial Impression / Assessment and Plan / ED Course  I have reviewed the triage vital signs and the nursing notes.  Pertinent labs & imaging results that were available during my care of the patient were reviewed by me and considered in my medical decision making (see chart for details).    Patient was given Maalox and she was instructed to gargle it and especially let it coat her tongue before she swallows it.  She also was given Pepcid.  After looking online I think she probably has transient lingual papillitis and probably some element of GERD.  Recheck at 630 patient states she feels much better.  We discussed her discharge instructions.   Final Clinical Impressions(s) / ED Diagnoses   Final diagnoses:  Transient lingual papillitis  Gastroesophageal reflux disease without esophagitis    ED Discharge Orders         Ordered    omeprazole (PRILOSEC) 20 MG capsule     03/24/18 0240         Plan discharge  Rolland Porter, MD, Barbette Or, MD 03/24/18 [phone]

## 2018-03-27 ENCOUNTER — Other Ambulatory Visit: Payer: Self-pay

## 2018-03-27 ENCOUNTER — Encounter (HOSPITAL_COMMUNITY): Payer: Self-pay | Admitting: Emergency Medicine

## 2018-03-27 ENCOUNTER — Emergency Department (HOSPITAL_COMMUNITY): Payer: Medicaid Other

## 2018-03-27 ENCOUNTER — Emergency Department (HOSPITAL_COMMUNITY)
Admission: EM | Admit: 2018-03-27 | Discharge: 2018-03-27 | Disposition: A | Discharge status: Home / Self Care | Payer: Medicaid Other | Attending: Emergency Medicine | Admitting: Emergency Medicine

## 2018-03-27 DIAGNOSIS — Z79899 Other long term (current) drug therapy: Secondary | ICD-10-CM | POA: Diagnosis not present

## 2018-03-27 DIAGNOSIS — R600 Localized edema: Secondary | ICD-10-CM | POA: Diagnosis not present

## 2018-03-27 DIAGNOSIS — Z87891 Personal history of nicotine dependence: Secondary | ICD-10-CM | POA: Diagnosis not present

## 2018-03-27 DIAGNOSIS — J45909 Unspecified asthma, uncomplicated: Secondary | ICD-10-CM | POA: Diagnosis not present

## 2018-03-27 DIAGNOSIS — R609 Edema, unspecified: Secondary | ICD-10-CM

## 2018-03-27 DIAGNOSIS — R2242 Localized swelling, mass and lump, left lower limb: Secondary | ICD-10-CM | POA: Diagnosis present

## 2018-03-27 LAB — BASIC METABOLIC PANEL
Anion gap: 8 (ref 5–15)
BUN: 18 mg/dL (ref 6–20)
CO2: 23 mmol/L (ref 22–32)
CREATININE: 0.89 mg/dL (ref 0.44–1.00)
Calcium: 8.4 mg/dL — ABNORMAL LOW (ref 8.9–10.3)
Chloride: 105 mmol/L (ref 98–111)
GFR calc Af Amer: >60 mL/min (ref >60)
GFR calc non Af Amer: >60 mL/min (ref >60)
Glucose, Bld: 164 mg/dL — ABNORMAL HIGH (ref 70–99)
Potassium: 3.4 mmol/L — ABNORMAL LOW (ref 3.5–5.1)
Sodium: 136 mmol/L (ref 135–145)

## 2018-03-27 LAB — CBC WITH DIFFERENTIAL/PLATELET
ABS IMMATURE GRANULOCYTES: 0.24 10*3/uL — AB (ref 0.00–0.07)
Basophils Absolute: 0 10*3/uL (ref 0.0–0.1)
Basophils Relative: 1 %
Eosinophils Absolute: 0.1 10*3/uL (ref 0.0–0.5)
Eosinophils Relative: 1 %
HCT: 34.7 % — ABNORMAL LOW (ref 36.0–46.0)
Hemoglobin: 10.9 g/dL — ABNORMAL LOW (ref 12.0–15.0)
Immature Granulocytes: 3 %
LYMPHS ABS: 2.7 10*3/uL (ref 0.7–4.0)
Lymphocytes Relative: 32 %
MCH: 30.7 pg (ref 26.0–34.0)
MCHC: 31.4 g/dL (ref 30.0–36.0)
MCV: 97.7 fL (ref 80.0–100.0)
MONOS PCT: 5 %
Monocytes Absolute: 0.4 10*3/uL (ref 0.1–1.0)
NRBC: 0 % (ref 0.0–0.2)
Neutro Abs: 5.1 10*3/uL (ref 1.7–7.7)
Neutrophils Relative %: 58 %
Platelets: 415 10*3/uL — ABNORMAL HIGH (ref 150–400)
RBC: 3.55 MIL/uL — ABNORMAL LOW (ref 3.87–5.11)
RDW: 13.8 % (ref 11.5–15.5)
WBC: 8.6 10*3/uL (ref 4.0–10.5)

## 2018-03-27 LAB — BRAIN NATRIURETIC PEPTIDE: B Natriuretic Peptide: 15 pg/mL (ref 0.0–100.0)

## 2018-03-27 LAB — URINALYSIS, MICROSCOPIC (REFLEX)

## 2018-03-27 LAB — HEPATIC FUNCTION PANEL
ALT: 44 U/L (ref 0–44)
AST: 23 U/L (ref 15–41)
Albumin: 3.3 g/dL — ABNORMAL LOW (ref 3.5–5.0)
Alkaline Phosphatase: 68 U/L (ref 38–126)
Bilirubin, Direct: <0.1 mg/dL (ref 0.0–0.2)
Total Bilirubin: 0.4 mg/dL (ref 0.3–1.2)
Total Protein: 6.3 g/dL — ABNORMAL LOW (ref 6.5–8.1)

## 2018-03-27 LAB — URINALYSIS, ROUTINE W REFLEX MICROSCOPIC
Bilirubin Urine: NEGATIVE
Glucose, UA: NEGATIVE mg/dL
Ketones, ur: NEGATIVE mg/dL
Leukocytes, UA: NEGATIVE
Nitrite: NEGATIVE
PROTEIN: NEGATIVE mg/dL
Specific Gravity, Urine: 1.015 (ref 1.005–1.030)
pH: 6.5 (ref 5.0–8.0)

## 2018-03-27 LAB — TROPONIN I

## 2018-03-27 LAB — D-DIMER, QUANTITATIVE: D-Dimer, Quant: 0.35 ug/mL-FEU (ref 0.00–0.50)

## 2018-03-27 MED ORDER — FUROSEMIDE 10 MG/ML IJ SOLN
40.0000 mg | Freq: Once | INTRAMUSCULAR | Status: AC
  Administered 2018-03-27: 40 mg via INTRAVENOUS
  Filled 2018-03-27: qty 4

## 2018-03-27 MED ORDER — HYDROCODONE-ACETAMINOPHEN 5-325 MG PO TABS: ORAL_TABLET | ORAL | 0 refills | Status: DC

## 2018-03-27 NOTE — ED Provider Notes (Signed)
[facility] EMERGENCY DEPARTMENT Provider Note   CSN: [id number] Arrival date & time: 03/27/18  1258     History   Chief Complaint Chief Complaint  Patient presents with  . Leg Swelling    HPI Kathryn [name] is a 47 y.o. female.  Pt presents to the ED today with bilateral lower extremity leg swelling and sob with exertion.  The pt said she was put on lasix (?dose) on 12/9 which has not helped.  She went to her pcp today who told her to come here.  Pt also has a "smell" when she urinates.     Past Medical History:  Diagnosis Date  . Asthma   . Chronic back pain   . Hypertension   . Neuropathy     Patient Active Problem List   Diagnosis Date Noted  . Enlarged uterus 02/20/2018  . Screening for colorectal cancer 02/20/2018  . Routine cervical smear 02/20/2018  . Encounter for gynecological examination with Papanicolaou smear of cervix 02/20/2018  . Osteoarthritis of left knee 12/27/2017  . MDD (major depressive disorder), recurrent episode, severe ([place]) 12/24/2017  . Lumbar back pain 05/11/2017  . Depression, major, single episode, moderate ([place]) 05/11/2017  . Low vitamin B12 level 05/11/2017  . Immunization due 05/11/2017  . Hypertension goal BP (blood pressure) < 140/90 01/11/2017    Past Surgical History:  Procedure Laterality Date  . TUBAL LIGATION       OB History    Gravida  5   Para  3   Term  2   Preterm  1   AB  2   Living  3     SAB  1   TAB  1   Ectopic      Multiple      Live Births               Home Medications    Prior to Admission medications   Medication Sig Start Date End Date Taking? Authorizing Provider  albuterol (PROVENTIL HFA;VENTOLIN HFA) 108 (90 Base) MCG/ACT inhaler Inhale 2 puffs into the lungs 2 (two) times daily.    [provider]  albuterol (PROVENTIL) (2.5 MG/3ML) 0.083% nebulizer solution Take 2.5 mg by nebulization every 6 (six) hours as needed for wheezing or shortness of breath.     [provider]  DULoxetine (CYMBALTA) 60 MG capsule Take 1 capsule (60 mg total) by mouth daily. 12/29/17   Starkes-Perry, Gayland Curry, FNP  famotidine (PEPCID) 20 MG tablet Take 1 tablet (20 mg total) by mouth 2 (two) times daily. 02/06/18   Virgel Manifold, MD  gabapentin (NEURONTIN) 300 MG capsule Take 1 capsule (300 mg total) by mouth 3 (three) times daily. 12/28/17   Starkes-Perry, Gayland Curry, FNP  hydrochlorothiazide (MICROZIDE) 12.5 MG capsule Take 12.5 mg by mouth daily.    [provider]  HYDROcodone-acetaminophen (NORCO/VICODIN) 5-325 MG tablet Take 1 every 6 hours for pain not relieved by Tylenol alone 03/27/18   [name], MD  loratadine (CLARITIN) 10 MG tablet Take 5 mg by mouth 2 (two) times daily.    [provider]  losartan (COZAAR) 50 MG tablet Take 25 mg by mouth 2 (two) times daily.    [provider]  meclizine (ANTIVERT) 25 MG tablet Take 1 tablet (25 mg total) by mouth 3 (three) times daily as needed for dizziness or nausea. 02/06/18   Virgel Manifold, MD  omeprazole (PRILOSEC) 20 MG capsule Take 1 po BID x 2 weeks then  once a day 03/24/18   Rolland Porter, MD  ondansetron (ZOFRAN ODT) 4 MG disintegrating tablet 4mg  ODT q4 hours prn nausea/vomit 03/21/18   Milton Ferguson, MD  pantoprazole (PROTONIX) 20 MG tablet Take 1 tablet (20 mg total) by mouth daily. 03/21/18   Milton Ferguson, MD  traZODone (DESYREL) 50 MG tablet Take 1 tablet (50 mg total) by mouth at bedtime. 12/28/17   Suella Broad, FNP    Family History Family History  Problem Relation Age of Onset  . Gout Paternal Grandfather   . Cirrhosis Paternal Grandfather   . Hypertension Paternal Grandmother   . Aneurysm Paternal Grandmother   . Cirrhosis Maternal Grandmother   . Cirrhosis Maternal Grandfather   . Cancer Father   . Cirrhosis Father   . Cirrhosis Mother   . Breast cancer Sister   . Hypertension Sister   . Bronchitis Daughter   . Bronchitis Daughter   . Asthma Son    . Bronchitis Son   . Migraines Neg Hx     Social History Social History   Tobacco Use  . Smoking status: Former Smoker    Types: Cigarettes  . Smokeless tobacco: Never Used  Substance Use Topics  . Alcohol use: Yes    Comment: occ  . Drug use: Not Currently    Types: Cocaine    Comment: crack     Allergies   Clonopin [clonazepam]; Fish allergy; Flexeril [cyclobenzaprine hcl]; Ace inhibitors; and Tramadol   Review of Systems Review of Systems  Respiratory: Positive for shortness of breath.   Musculoskeletal:       Leg swelling  All other systems reviewed and are negative.    Physical Exam Updated Vital Signs BP 111/70   Pulse (!) 102   Temp 97.8 F (36.6 C) (Oral)   Resp 18   LMP 03/07/2018   SpO2 98%   Physical Exam  Constitutional: She is oriented to person, place, and time. She appears well-developed and well-nourished.  HENT:  Head: Normocephalic and atraumatic.  Right Ear: External ear normal.  Left Ear: External ear normal.  Nose: Nose normal.  Mouth/Throat: Oropharynx is clear and moist.  Eyes: Pupils are equal, round, and reactive to light. Conjunctivae and EOM are normal.  Neck: Normal range of motion. Neck supple.  Cardiovascular: Regular rhythm, normal heart sounds and intact distal pulses. Tachycardia present.  Pulmonary/Chest: Effort normal and breath sounds normal.  Abdominal: Soft. Bowel sounds are normal.  Musculoskeletal: She exhibits edema.  Neurological: She is alert and oriented to person, place, and time.  Skin: Skin is warm. Capillary refill takes less than 2 seconds.  Psychiatric: She has a normal mood and affect. Her behavior is normal. Judgment and thought content normal.  Nursing note and vitals reviewed.    ED Treatments / Results  Labs (all labs ordered are listed, but only abnormal results are displayed) Labs Reviewed  CBC WITH DIFFERENTIAL/PLATELET - Abnormal; Notable for the following components:      Result Value    RBC 3.55 (*)    Hemoglobin 10.9 (*)    HCT 34.7 (*)    Platelets 415 (*)    Abs Immature Granulocytes 0.24 (*)    All other components within normal limits  BASIC METABOLIC PANEL - Abnormal; Notable for the following components:   Potassium 3.4 (*)    Glucose, Bld 164 (*)    Calcium 8.4 (*)    All other components within normal limits  URINALYSIS, ROUTINE W REFLEX MICROSCOPIC - Abnormal;  Notable for the following components:   Hgb urine dipstick TRACE (*)    All other components within normal limits  HEPATIC FUNCTION PANEL - Abnormal; Notable for the following components:   Total Protein 6.3 (*)    Albumin 3.3 (*)    All other components within normal limits  URINALYSIS, MICROSCOPIC (REFLEX) - Abnormal; Notable for the following components:   Bacteria, UA RARE (*)    All other components within normal limits  BRAIN NATRIURETIC PEPTIDE  TROPONIN I  D-DIMER, QUANTITATIVE (NOT AT [facility])  HEPATITIS PANEL, ACUTE    EKG EKG Interpretation  Date/Time:  Wednesday March 27 2018 13:18:16 EST Ventricular Rate:  109 PR Interval:  136 QRS Duration: 78 QT Interval:  336 QTC Calculation: 452 R Axis:   61 Text Interpretation:  Sinus tachycardia Otherwise normal ECG Since last tracing rate faster Confirmed by [name] [phone]) on 03/27/2018 3:05:10 PM   Radiology Dg Chest 2 View  Result Date: 03/27/2018 CLINICAL DATA:  Chest pain. EXAM: CHEST - 2 VIEW COMPARISON:  12/05/2016 and 04/23/2016 FINDINGS: The heart size and mediastinal contours are within normal limits. Both lungs are clear. The visualized skeletal structures are unremarkable. IMPRESSION: Normal exam. Electronically Signed   By: Lorriane Shire M.D.   On: 03/27/2018 13:47    Procedures Procedures (including critical care time)  Medications Ordered in ED Medications  furosemide (LASIX) injection 40 mg (40 mg Intravenous Given 03/27/18 1532)     Initial Impression / Assessment and Plan / ED Course  I have  reviewed the triage vital signs and the nursing notes.  Pertinent labs & imaging results that were available during my care of the patient were reviewed by me and considered in my medical decision making (see chart for details).     Pt's work up is unremarkable for anything acute.  She has a negative ddimer, neg EKG, neg trop, neg CXR.  Pt told to increase her lasix and to get compression hose.  I added on a hepatitis panel due to her hx of drug use although LFTs are nl.  Pt told to f/u with pcp.  Return if worse.  Final Clinical Impressions(s) / ED Diagnoses   Final diagnoses:  Peripheral edema    ED Discharge Orders         Ordered    HYDROcodone-acetaminophen (NORCO/VICODIN) 5-325 MG tablet     03/27/18 1652           [name], MD 03/27/18 1653

## 2018-03-27 NOTE — Discharge Instructions (Signed)
Continue to take the lasix, but increase it to 1 tablet daily instead of 1/2 tablet.  Compression hose to legs.

## 2018-03-27 NOTE — ED Triage Notes (Addendum)
Patient complaining of swelling to bilateral legs. States she was treated here recently for same and not any better. Also complains of "some shortness of breath at times." Also complaining of chest pain.

## 2018-03-28 LAB — HEPATITIS PANEL, ACUTE
HCV Ab: <0.1 s/co ratio (ref 0.0–0.9)
HEP A IGM: NEGATIVE
HEP B C IGM: NEGATIVE
Hepatitis B Surface Ag: NEGATIVE

## 2018-03-31 ENCOUNTER — Other Ambulatory Visit: Payer: Self-pay

## 2018-03-31 ENCOUNTER — Encounter (HOSPITAL_COMMUNITY): Payer: Self-pay | Admitting: *Deleted

## 2018-03-31 ENCOUNTER — Emergency Department (HOSPITAL_COMMUNITY)
Admission: EM | Admit: 2018-03-31 | Discharge: 2018-03-31 | Disposition: A | Discharge status: Home / Self Care | Payer: Medicaid Other | Attending: Emergency Medicine | Admitting: Emergency Medicine

## 2018-03-31 ENCOUNTER — Emergency Department (HOSPITAL_COMMUNITY): Payer: Medicaid Other

## 2018-03-31 DIAGNOSIS — M791 Myalgia, unspecified site: Secondary | ICD-10-CM | POA: Diagnosis not present

## 2018-03-31 DIAGNOSIS — Z87891 Personal history of nicotine dependence: Secondary | ICD-10-CM | POA: Insufficient documentation

## 2018-03-31 DIAGNOSIS — Z79899 Other long term (current) drug therapy: Secondary | ICD-10-CM | POA: Diagnosis not present

## 2018-03-31 DIAGNOSIS — R2242 Localized swelling, mass and lump, left lower limb: Secondary | ICD-10-CM | POA: Diagnosis not present

## 2018-03-31 DIAGNOSIS — R2241 Localized swelling, mass and lump, right lower limb: Secondary | ICD-10-CM | POA: Insufficient documentation

## 2018-03-31 DIAGNOSIS — R0602 Shortness of breath: Secondary | ICD-10-CM | POA: Diagnosis present

## 2018-03-31 DIAGNOSIS — R079 Chest pain, unspecified: Secondary | ICD-10-CM | POA: Insufficient documentation

## 2018-03-31 DIAGNOSIS — J45909 Unspecified asthma, uncomplicated: Secondary | ICD-10-CM | POA: Insufficient documentation

## 2018-03-31 LAB — CBC
HEMATOCRIT: 33.9 % — AB (ref 36.0–46.0)
Hemoglobin: 11 g/dL — ABNORMAL LOW (ref 12.0–15.0)
MCH: 31.1 pg (ref 26.0–34.0)
MCHC: 32.4 g/dL (ref 30.0–36.0)
MCV: 95.8 fL (ref 80.0–100.0)
Platelets: 411 10*3/uL — ABNORMAL HIGH (ref 150–400)
RBC: 3.54 MIL/uL — ABNORMAL LOW (ref 3.87–5.11)
RDW: 13.7 % (ref 11.5–15.5)
WBC: 5.8 10*3/uL (ref 4.0–10.5)
nRBC: 0 % (ref 0.0–0.2)

## 2018-03-31 LAB — HEPATIC FUNCTION PANEL
ALT: 37 U/L (ref 0–44)
AST: 29 U/L (ref 15–41)
Albumin: 3.4 g/dL — ABNORMAL LOW (ref 3.5–5.0)
Alkaline Phosphatase: 56 U/L (ref 38–126)
Bilirubin, Direct: <0.1 mg/dL (ref 0.0–0.2)
Total Bilirubin: 0.3 mg/dL (ref 0.3–1.2)
Total Protein: 6.8 g/dL (ref 6.5–8.1)

## 2018-03-31 LAB — BRAIN NATRIURETIC PEPTIDE: B NATRIURETIC PEPTIDE 5: 13 pg/mL (ref 0.0–100.0)

## 2018-03-31 LAB — BASIC METABOLIC PANEL
Anion gap: 5 (ref 5–15)
BUN: 12 mg/dL (ref 6–20)
CO2: 24 mmol/L (ref 22–32)
Calcium: 8 mg/dL — ABNORMAL LOW (ref 8.9–10.3)
Chloride: 110 mmol/L (ref 98–111)
Creatinine, Ser: 0.74 mg/dL (ref 0.44–1.00)
GFR calc Af Amer: >60 mL/min (ref >60)
GFR calc non Af Amer: >60 mL/min (ref >60)
Glucose, Bld: 111 mg/dL — ABNORMAL HIGH (ref 70–99)
Potassium: 3.5 mmol/L (ref 3.5–5.1)
Sodium: 139 mmol/L (ref 135–145)

## 2018-03-31 LAB — TROPONIN I: Troponin I: <0.03 ng/mL (ref <0.03)

## 2018-03-31 LAB — C-REACTIVE PROTEIN: CRP: 1.3 mg/dL — AB (ref <1.0)

## 2018-03-31 LAB — SEDIMENTATION RATE: Sed Rate: 62 mm/hr — ABNORMAL HIGH (ref 0–22)

## 2018-03-31 LAB — TSH: TSH: 0.533 u[IU]/mL (ref 0.350–4.500)

## 2018-03-31 MED ORDER — PREDNISONE 10 MG PO TABS: 40.0000 mg | ORAL_TABLET | Freq: Every day | ORAL | 0 refills | Status: DC

## 2018-03-31 MED ORDER — HYDROMORPHONE HCL 1 MG/ML IJ SOLN
1.0000 mg | Freq: Once | INTRAMUSCULAR | Status: AC
  Administered 2018-03-31: 1 mg via INTRAVENOUS
  Filled 2018-03-31: qty 1

## 2018-03-31 MED ORDER — KETOROLAC TROMETHAMINE 30 MG/ML IJ SOLN
30.0000 mg | Freq: Once | INTRAMUSCULAR | Status: AC
  Administered 2018-03-31: 30 mg via INTRAVENOUS
  Filled 2018-03-31: qty 1

## 2018-03-31 MED ORDER — ONDANSETRON HCL 4 MG/2ML IJ SOLN
4.0000 mg | Freq: Once | INTRAMUSCULAR | Status: AC
  Administered 2018-03-31: 4 mg via INTRAVENOUS
  Filled 2018-03-31: qty 2

## 2018-03-31 MED ORDER — PREDNISONE 50 MG PO TABS
60.0000 mg | ORAL_TABLET | Freq: Once | ORAL | Status: AC
  Administered 2018-03-31: 60 mg via ORAL
  Filled 2018-03-31: qty 1

## 2018-03-31 MED ORDER — KETOROLAC TROMETHAMINE 30 MG/ML IJ SOLN: 30.0000 mg | Freq: Once | INTRAMUSCULAR | Status: DC

## 2018-03-31 NOTE — ED Provider Notes (Signed)
[facility] EMERGENCY DEPARTMENT Provider Note   CSN: [id number] Arrival date & time: 03/31/18  3267     History   Chief Complaint Chief Complaint  Patient presents with  . Chest Pain    HPI Kathryn [name] is a 47 y.o. female.  Patient returning to the emergency department for shortness of breath swelling and chest pain and cough and also bilateral leg swelling.  This is the third time she has been seen in the emergency department at least for the leg swelling is not getting any better.  Is also associated with myalgias.  And tenderness to touch.  This is a new finding for the patient.  Patient seen December 11 complaint of swelling to both legs.  Had very extensive work-up at that time.  Patient's vital signs on presentation today heart rate 98.  Not febrile.  Respiratory rate 17 oxygen saturations on room air 97%.  Patient's primary care provider is Dr. Maudie Mercury she is also been seen at the office there for this.  Patient's been put on a course of steroids with some help but did not resolve the issue.  Patient's past medical history prior to this known for hypertension asthma known to have neuropathy and chronic back pain.  In summary patient was seen December 11 for bilateral leg swelling.  Seen December 8 for sore throat.  Seen December 5 for abdominal pain in the upper part of the abdomen.  What is interesting about the bilateral leg swelling is that there is a lot of myalgia and arthralgias associated with it.  She is never had anything like this in the past.  The pain is limited to the both legs.     Past Medical History:  Diagnosis Date  . Asthma   . Chronic back pain   . Hypertension   . Neuropathy     Patient Active Problem List   Diagnosis Date Noted  . Enlarged uterus 02/20/2018  . Screening for colorectal cancer 02/20/2018  . Routine cervical smear 02/20/2018  . Encounter for gynecological examination with Papanicolaou smear of cervix 02/20/2018  . Osteoarthritis of  left knee 12/27/2017  . MDD (major depressive disorder), recurrent episode, severe ([place]) 12/24/2017  . Lumbar back pain 05/11/2017  . Depression, major, single episode, moderate ([place]) 05/11/2017  . Low vitamin B12 level 05/11/2017  . Immunization due 05/11/2017  . Hypertension goal BP (blood pressure) < 140/90 01/11/2017    Past Surgical History:  Procedure Laterality Date  . TUBAL LIGATION       OB History    Gravida  5   Para  3   Term  2   Preterm  1   AB  2   Living  3     SAB  1   TAB  1   Ectopic      Multiple      Live Births               Home Medications    Prior to Admission medications   Medication Sig Start Date End Date Taking? Authorizing Provider  albuterol (PROVENTIL HFA;VENTOLIN HFA) 108 (90 Base) MCG/ACT inhaler Inhale 2 puffs into the lungs 2 (two) times daily.   Yes [provider]  albuterol (PROVENTIL) (2.5 MG/3ML) 0.083% nebulizer solution Take 2.5 mg by nebulization every 6 (six) hours as needed for wheezing or shortness of breath.   Yes [provider]  cholecalciferol (VITAMIN D3) 25 MCG (1000 UT) tablet Take 2,000 Units by  mouth daily.   Yes [provider]  DULoxetine (CYMBALTA) 60 MG capsule Take 1 capsule (60 mg total) by mouth daily. 12/29/17  Yes Starkes-Perry, Gayland Curry, FNP  fluticasone (VERAMYST) 27.5 MCG/SPRAY nasal spray Place 2 sprays into the nose daily.   Yes [provider]  gabapentin (NEURONTIN) 300 MG capsule Take 1 capsule (300 mg total) by mouth 3 (three) times daily. 12/28/17  Yes Starkes-Perry, Gayland Curry, FNP  hydrochlorothiazide (MICROZIDE) 12.5 MG capsule Take 12.5 mg by mouth daily.   Yes [provider]  HYDROcodone-acetaminophen (NORCO/VICODIN) 5-325 MG tablet Take 1 every 6 hours for pain not relieved by Tylenol alone 03/27/18  Yes [name], MD  loratadine (CLARITIN) 10 MG tablet Take 5 mg by mouth 2 (two) times daily.   Yes [provider]  losartan  (COZAAR) 50 MG tablet Take 50 mg by mouth daily.    Yes [provider]  mirtazapine (REMERON) 30 MG tablet Take 30 mg by mouth at bedtime.   Yes [provider]  omeprazole (PRILOSEC) 20 MG capsule Take 1 po BID x 2 weeks then once a day 03/24/18  Yes Rolland Porter, MD  pantoprazole (PROTONIX) 20 MG tablet Take 1 tablet (20 mg total) by mouth daily. 03/21/18  Yes Milton Ferguson, MD  famotidine (PEPCID) 20 MG tablet Take 1 tablet (20 mg total) by mouth 2 (two) times daily. Patient not taking: Reported on 03/31/2018 02/06/18   Virgel Manifold, MD  meclizine (ANTIVERT) 25 MG tablet Take 1 tablet (25 mg total) by mouth 3 (three) times daily as needed for dizziness or nausea. Patient not taking: Reported on 03/31/2018 02/06/18   Virgel Manifold, MD  ondansetron (ZOFRAN ODT) 4 MG disintegrating tablet 4mg  ODT q4 hours prn nausea/vomit Patient not taking: Reported on 03/31/2018 03/21/18   Milton Ferguson, MD  predniSONE (DELTASONE) 10 MG tablet Take 4 tablets (40 mg total) by mouth daily. 03/31/18   Fredia Sorrow, MD  traZODone (DESYREL) 50 MG tablet Take 1 tablet (50 mg total) by mouth at bedtime. Patient not taking: Reported on 03/31/2018 12/28/17   Suella Broad, FNP    Family History Family History  Problem Relation Age of Onset  . Gout Paternal Grandfather   . Cirrhosis Paternal Grandfather   . Hypertension Paternal Grandmother   . Aneurysm Paternal Grandmother   . Cirrhosis Maternal Grandmother   . Cirrhosis Maternal Grandfather   . Cancer Father   . Cirrhosis Father   . Cirrhosis Mother   . Breast cancer Sister   . Hypertension Sister   . Bronchitis Daughter   . Bronchitis Daughter   . Asthma Son   . Bronchitis Son   . Migraines Neg Hx     Social History Social History   Tobacco Use  . Smoking status: Former Smoker    Types: Cigarettes  . Smokeless tobacco: Never Used  Substance Use Topics  . Alcohol use: Yes    Comment: occ  . Drug use: Not  Currently    Types: Cocaine    Comment: crack     Allergies   Clonopin [clonazepam]; Fish allergy; Flexeril [cyclobenzaprine hcl]; Ibuprofen; Ace inhibitors; and Tramadol   Review of Systems Review of Systems  Constitutional: Negative for chills and fever.  HENT: Positive for congestion and sore throat. Negative for voice change.   Eyes: Negative for visual disturbance.  Respiratory: Positive for cough and shortness of breath. Negative for wheezing.   Cardiovascular: Positive for leg swelling. Negative for chest pain.  Gastrointestinal: Negative for abdominal pain.  Genitourinary: Negative for dysuria and hematuria.  Musculoskeletal: Positive for arthralgias and myalgias.  Skin: Negative for rash and wound.  Neurological: Negative for speech difficulty, weakness and headaches.  Hematological: Does not bruise/bleed easily.  Psychiatric/Behavioral: Negative for confusion.     Physical Exam Updated Vital Signs BP 113/73   Pulse 94   Temp 98.1 F (36.7 C) (Oral)   Resp 17   Ht 1.499 m (4\' 11" )   Wt 85.7 kg   LMP 03/07/2018   SpO2 97%   BMI 38.16 kg/m   Physical Exam Vitals signs and nursing note reviewed.  Constitutional:      General: She is not in acute distress.    Appearance: Normal appearance. She is not ill-appearing, toxic-appearing or diaphoretic.  HENT:     Head: Normocephalic and atraumatic.     Mouth/Throat:     Mouth: Mucous membranes are moist.     Pharynx: No oropharyngeal exudate or posterior oropharyngeal erythema.  Eyes:     Extraocular Movements: Extraocular movements intact.     Conjunctiva/sclera: Conjunctivae normal.     Pupils: Pupils are equal, round, and reactive to light.  Neck:     Musculoskeletal: Neck supple.  Cardiovascular:     Rate and Rhythm: Normal rate and regular rhythm.     Pulses: Normal pulses.     Heart sounds: Normal heart sounds.  Pulmonary:     Effort: Pulmonary effort is normal.  Abdominal:     General: Bowel  sounds are normal.     Palpations: Abdomen is soft.     Tenderness: There is no abdominal tenderness.  Musculoskeletal:        General: Swelling and tenderness present.     Right lower leg: Edema present.     Left lower leg: Edema present.     Comments: Significant bilateral lower extremity edema.  No obvious knee effusions or ankle effusions no obvious erythema.  However the entire leg is very sensitive to touch and and elicits pain.  Cap refill is less than 2 seconds to both feet.  Dorsalis pedis pulses 1+.  Bilaterally.  No obvious increased warmth to both legs.  But they are warm to touch.  Lymphadenopathy:     Cervical: No cervical adenopathy.  Skin:    General: Skin is warm.     Capillary Refill: Capillary refill takes less than 2 seconds.     Findings: No erythema or rash.  Neurological:     General: No focal deficit present.     Mental Status: She is alert and oriented to person, place, and time.     Cranial Nerves: No cranial nerve deficit.     Sensory: No sensory deficit.     Motor: No weakness.      ED Treatments / Results  Labs (all labs ordered are listed, but only abnormal results are displayed) Labs Reviewed  BASIC METABOLIC PANEL - Abnormal; Notable for the following components:      Result Value   Glucose, Bld 111 (*)    Calcium 8.0 (*)    All other components within normal limits  CBC - Abnormal; Notable for the following components:   RBC 3.54 (*)    Hemoglobin 11.0 (*)    HCT 33.9 (*)    Platelets 411 (*)    All other components within normal limits  SEDIMENTATION RATE - Abnormal; Notable for the following components:   Sed Rate 62 (*)    All other  components within normal limits  HEPATIC FUNCTION PANEL - Abnormal; Notable for the following components:   Albumin 3.4 (*)    All other components within normal limits  C-REACTIVE PROTEIN - Abnormal; Notable for the following components:   CRP 1.3 (*)    All other components within normal limits    TROPONIN I  BRAIN NATRIURETIC PEPTIDE  TSH  RHEUMATOID FACTOR  ANTINUCLEAR ANTIBODIES, IFA    EKG EKG Interpretation  Date/Time:  "Sunday March 31 2018 06:32:21 EST Ventricular Rate:  104 PR Interval:    QRS Duration: 80 QT Interval:  323 QTC Calculation: 425 R Axis:   79 Text Interpretation:  Sinus tachycardia Borderline T abnormalities, diffuse leads No significant change since last tracing Confirmed by Sammy Cassar ([id number]) on 03/31/2018 7:34:48 AM   Radiology Dg Chest 2 View  Result Date: 03/31/2018 CLINICAL DATA:  Shortness of breath and swelling.  Chest pain. EXAM: CHEST - 2 VIEW COMPARISON:  March 27, 2018 FINDINGS: The heart size and mediastinal contours are within normal limits. Both lungs are clear. The visualized skeletal structures are unremarkable. IMPRESSION: No active cardiopulmonary disease. Electronically Signed   By: David  Williams III M.D   On: 03/31/2018 07:33    Procedures Procedures (including critical care time)  Medications Ordered in ED Medications  predniSONE (DELTASONE) tablet 60 mg (60 mg Oral Given 03/31/18 0832)  ketorolac (TORADOL) 30 MG/ML injection 30 mg (30 mg Intravenous Given 03/31/18 0833)  HYDROmorphone (DILAUDID) injection 1 mg (1 mg Intravenous Given 03/31/18 1141)  ondansetron (ZOFRAN) injection 4 mg (4 mg Intravenous Given 03/31/18 1140)     Initial Impression / Assessment and Plan / ED Course  I have reviewed the triage vital signs and the nursing notes.  Pertinent labs & imaging results that were available during my care of the patient were reviewed by me and considered in my medical decision making (see chart for details).     Previous labs and work-up reviewed.  Patient symptoms are somewhat suggestive of a connective tissue disorder particularly with the arthralgia myalgia to both lower extremities and the swelling and the fact that is tender to touch.  Work-up for other causes of lower extremity edema have come  up negative no evidence of any significant pulmonary edema.  Chest x-ray again here today shows no pulmonary edema.  Patient's troponin negative BNP not elevated.  Sed rate elevated some at 60 but it is less than 100.  Patient's thyroid-stimulating hormone was in normal range.  Additional connective tissue disorder labs to include C-reactive protein rheumatoid factor and ANA have been ordered.  Patient be restarted on prednisone given 60 mg here.  Also given Toradol as well as 1 dose of hydromorphone with some improvement.  Patient will follow back up with the [facility].  For further evaluation.  Patient nontoxic no acute distress.  Addendum it appears now C-reactive protein has come back and is 1.3.   Final Clinical Impressions(s) / ED Diagnoses   Final diagnoses:  Myalgia    ED Discharge Orders         Ordered    predniSONE (DELTASONE) 10 MG tablet  Daily     12" /15/19 1316           Fredia Sorrow, MD 04/03/18 1157

## 2018-03-31 NOTE — Discharge Instructions (Addendum)
As we discussed some suspicious that she may have a connective tissue disorder.  Restart the prednisone prescription provided.  You can start that tomorrow.  Make an appointment to follow back up with the [facility].  We ordered some of the initial labs here to evaluate a connective tissue disorder.  Your regular doctors will probably need to order additional labs.  Work-up otherwise here today negative.

## 2018-03-31 NOTE — ED Triage Notes (Signed)
Pt returns to er for sob, swelling, chest pain, cough, states that this is the third time she has been seen in er for same and is not getting any better,

## 2018-04-01 ENCOUNTER — Ambulatory Visit: Payer: Medicaid Other | Admitting: Adult Health

## 2018-04-01 LAB — RHEUMATOID FACTOR: Rheumatoid fact SerPl-aCnc: <10 IU/mL (ref 0.0–13.9)

## 2018-04-02 LAB — ANTINUCLEAR ANTIBODIES, IFA: ANA Ab, IFA: NEGATIVE

## 2018-04-16 ENCOUNTER — Ambulatory Visit (INDEPENDENT_AMBULATORY_CARE_PROVIDER_SITE_OTHER): Payer: Medicaid Other

## 2018-04-16 ENCOUNTER — Encounter (INDEPENDENT_AMBULATORY_CARE_PROVIDER_SITE_OTHER): Payer: Self-pay

## 2018-04-16 DIAGNOSIS — N852 Hypertrophy of uterus: Secondary | ICD-10-CM

## 2018-04-16 NOTE — Progress Notes (Signed)
PELVIC US TA/TV: heterogeneous anteverted uterus w/mult fibroids, largest fibroids: (#1) posterior subserosal fibroid 2.2 x 1.9 x 3 cm,(#2) anterior subserosal fibroid 2.5 x 2.6 x 1.5 cm,EEC 6.8 mm,normal ovaries bilat,ovaries appear mobile,no pain during ultrasound

## 2018-04-18 ENCOUNTER — Telehealth: Payer: Self-pay | Admitting: Adult Health

## 2018-04-18 DIAGNOSIS — N852 Hypertrophy of uterus: Secondary | ICD-10-CM

## 2018-04-18 NOTE — Telephone Encounter (Signed)
Pt aware that [name] sowed multiple fibroids, she says stomach is sore, will get an appt with Dr Glo Herring to discuss options.

## 2018-04-19 ENCOUNTER — Emergency Department (HOSPITAL_COMMUNITY): Payer: Medicaid Other

## 2018-04-19 ENCOUNTER — Other Ambulatory Visit: Payer: Self-pay

## 2018-04-19 ENCOUNTER — Emergency Department (HOSPITAL_COMMUNITY)
Admission: EM | Admit: 2018-04-19 | Discharge: 2018-04-19 | Disposition: A | Discharge status: Home / Self Care | Payer: Medicaid Other | Attending: Emergency Medicine | Admitting: Emergency Medicine

## 2018-04-19 ENCOUNTER — Encounter (HOSPITAL_COMMUNITY): Payer: Self-pay

## 2018-04-19 DIAGNOSIS — E876 Hypokalemia: Secondary | ICD-10-CM

## 2018-04-19 DIAGNOSIS — Z87891 Personal history of nicotine dependence: Secondary | ICD-10-CM | POA: Diagnosis not present

## 2018-04-19 DIAGNOSIS — I10 Essential (primary) hypertension: Secondary | ICD-10-CM | POA: Insufficient documentation

## 2018-04-19 DIAGNOSIS — J45909 Unspecified asthma, uncomplicated: Secondary | ICD-10-CM | POA: Diagnosis not present

## 2018-04-19 DIAGNOSIS — J209 Acute bronchitis, unspecified: Secondary | ICD-10-CM | POA: Diagnosis not present

## 2018-04-19 DIAGNOSIS — M791 Myalgia, unspecified site: Secondary | ICD-10-CM | POA: Diagnosis present

## 2018-04-19 DIAGNOSIS — B349 Viral infection, unspecified: Secondary | ICD-10-CM | POA: Diagnosis not present

## 2018-04-19 DIAGNOSIS — Z79899 Other long term (current) drug therapy: Secondary | ICD-10-CM | POA: Insufficient documentation

## 2018-04-19 DIAGNOSIS — N3090 Cystitis, unspecified without hematuria: Secondary | ICD-10-CM

## 2018-04-19 LAB — URINALYSIS, ROUTINE W REFLEX MICROSCOPIC
Bacteria, UA: NONE SEEN
Bilirubin Urine: NEGATIVE
Glucose, UA: NEGATIVE mg/dL
Ketones, ur: NEGATIVE mg/dL
NITRITE: NEGATIVE
PROTEIN: NEGATIVE mg/dL
Specific Gravity, Urine: 1.009 (ref 1.005–1.030)
pH: 6 (ref 5.0–8.0)

## 2018-04-19 LAB — CBC WITH DIFFERENTIAL/PLATELET
Abs Immature Granulocytes: 0.06 10*3/uL (ref 0.00–0.07)
BASOS ABS: 0 10*3/uL (ref 0.0–0.1)
Basophils Relative: 0 %
Eosinophils Absolute: 0.1 10*3/uL (ref 0.0–0.5)
Eosinophils Relative: 1 %
HCT: 33.1 % — ABNORMAL LOW (ref 36.0–46.0)
HEMOGLOBIN: 10.8 g/dL — AB (ref 12.0–15.0)
Immature Granulocytes: 1 %
Lymphocytes Relative: 21 %
Lymphs Abs: 2.4 10*3/uL (ref 0.7–4.0)
MCH: 31 pg (ref 26.0–34.0)
MCHC: 32.6 g/dL (ref 30.0–36.0)
MCV: 95.1 fL (ref 80.0–100.0)
Monocytes Absolute: 0.9 10*3/uL (ref 0.1–1.0)
Monocytes Relative: 8 %
Neutro Abs: 7.8 10*3/uL — ABNORMAL HIGH (ref 1.7–7.7)
Neutrophils Relative %: 69 %
Platelets: 445 10*3/uL — ABNORMAL HIGH (ref 150–400)
RBC: 3.48 MIL/uL — AB (ref 3.87–5.11)
RDW: 12.9 % (ref 11.5–15.5)
WBC: 11.2 10*3/uL — AB (ref 4.0–10.5)
nRBC: 0 % (ref 0.0–0.2)

## 2018-04-19 LAB — GROUP A STREP BY PCR: Group A Strep by PCR: NOT DETECTED

## 2018-04-19 LAB — COMPREHENSIVE METABOLIC PANEL
ALT: 18 U/L (ref 0–44)
AST: 19 U/L (ref 15–41)
Albumin: 3.4 g/dL — ABNORMAL LOW (ref 3.5–5.0)
Alkaline Phosphatase: 48 U/L (ref 38–126)
Anion gap: 10 (ref 5–15)
BUN: 8 mg/dL (ref 6–20)
CHLORIDE: 105 mmol/L (ref 98–111)
CO2: 22 mmol/L (ref 22–32)
Calcium: 7.9 mg/dL — ABNORMAL LOW (ref 8.9–10.3)
Creatinine, Ser: 0.8 mg/dL (ref 0.44–1.00)
GFR calc non Af Amer: >60 mL/min (ref >60)
Glucose, Bld: 87 mg/dL (ref 70–99)
Potassium: 2.9 mmol/L — ABNORMAL LOW (ref 3.5–5.1)
Sodium: 137 mmol/L (ref 135–145)
Total Bilirubin: 0.5 mg/dL (ref 0.3–1.2)
Total Protein: 6.4 g/dL — ABNORMAL LOW (ref 6.5–8.1)

## 2018-04-19 LAB — PREGNANCY, URINE: Preg Test, Ur: NEGATIVE

## 2018-04-19 LAB — INFLUENZA PANEL BY PCR (TYPE A & B)
Influenza A By PCR: NEGATIVE
Influenza B By PCR: NEGATIVE

## 2018-04-19 LAB — LIPASE, BLOOD: Lipase: 30 U/L (ref 11–51)

## 2018-04-19 MED ORDER — BENZONATATE 100 MG PO CAPS: 200.0000 mg | ORAL_CAPSULE | Freq: Three times a day (TID) | ORAL | 0 refills | Status: DC | PRN

## 2018-04-19 MED ORDER — PROMETHAZINE-CODEINE 6.25-10 MG/5ML PO SYRP: 5.0000 mL | ORAL_SOLUTION | ORAL | 0 refills | Status: DC | PRN

## 2018-04-19 MED ORDER — POTASSIUM CHLORIDE CRYS ER 20 MEQ PO TBCR: 20.0000 meq | EXTENDED_RELEASE_TABLET | Freq: Two times a day (BID) | ORAL | 0 refills | Status: DC

## 2018-04-19 MED ORDER — GUAIFENESIN-CODEINE 100-10 MG/5ML PO SOLN
10.0000 mL | Freq: Once | ORAL | Status: AC
  Administered 2018-04-19: 10 mL via ORAL
  Filled 2018-04-19: qty 10

## 2018-04-19 MED ORDER — SODIUM CHLORIDE 0.9 % IV BOLUS
1000.0000 mL | Freq: Once | INTRAVENOUS | Status: AC
  Administered 2018-04-19: 1000 mL via INTRAVENOUS

## 2018-04-19 MED ORDER — ALBUTEROL SULFATE (2.5 MG/3ML) 0.083% IN NEBU
2.5000 mg | INHALATION_SOLUTION | Freq: Once | RESPIRATORY_TRACT | Status: AC
  Administered 2018-04-19: 2.5 mg via RESPIRATORY_TRACT
  Filled 2018-04-19: qty 3

## 2018-04-19 MED ORDER — AZITHROMYCIN 250 MG PO TABS: ORAL_TABLET | ORAL | 0 refills | Status: DC

## 2018-04-19 MED ORDER — ACETAMINOPHEN 500 MG PO TABS
1000.0000 mg | ORAL_TABLET | Freq: Once | ORAL | Status: AC
  Administered 2018-04-19: 1000 mg via ORAL
  Filled 2018-04-19: qty 2

## 2018-04-19 MED ORDER — CEPHALEXIN 500 MG PO CAPS: 500.0000 mg | ORAL_CAPSULE | Freq: Four times a day (QID) | ORAL | 0 refills | Status: DC

## 2018-04-19 MED ORDER — POTASSIUM CHLORIDE CRYS ER 20 MEQ PO TBCR
20.0000 meq | EXTENDED_RELEASE_TABLET | Freq: Once | ORAL | Status: AC
  Administered 2018-04-19: 20 meq via ORAL
  Filled 2018-04-19: qty 1

## 2018-04-19 MED ORDER — IPRATROPIUM-ALBUTEROL 0.5-2.5 (3) MG/3ML IN SOLN
3.0000 mL | Freq: Once | RESPIRATORY_TRACT | Status: AC
  Administered 2018-04-19: 3 mL via RESPIRATORY_TRACT
  Filled 2018-04-19: qty 3

## 2018-04-19 MED ORDER — BENZONATATE 100 MG PO CAPS
200.0000 mg | ORAL_CAPSULE | Freq: Once | ORAL | Status: AC
  Administered 2018-04-19: 200 mg via ORAL
  Filled 2018-04-19: qty 2

## 2018-04-19 MED ORDER — AZITHROMYCIN 250 MG PO TABS
500.0000 mg | ORAL_TABLET | Freq: Once | ORAL | Status: AC
  Administered 2018-04-19: 500 mg via ORAL
  Filled 2018-04-19: qty 2

## 2018-04-19 MED ORDER — ALBUTEROL SULFATE HFA 108 (90 BASE) MCG/ACT IN AERS
2.0000 | INHALATION_SPRAY | Freq: Once | RESPIRATORY_TRACT | Status: AC
  Administered 2018-04-19: 2 via RESPIRATORY_TRACT
  Filled 2018-04-19: qty 6.7

## 2018-04-19 MED ORDER — ONDANSETRON HCL 4 MG/2ML IJ SOLN
4.0000 mg | Freq: Once | INTRAMUSCULAR | Status: AC
  Administered 2018-04-19: 4 mg via INTRAVENOUS
  Filled 2018-04-19: qty 2

## 2018-04-19 MED ORDER — METHYLPREDNISOLONE SODIUM SUCC 125 MG IJ SOLR
125.0000 mg | Freq: Once | INTRAMUSCULAR | Status: AC
  Administered 2018-04-19: 125 mg via INTRAVENOUS
  Filled 2018-04-19: qty 2

## 2018-04-19 NOTE — ED Notes (Signed)
Pt able to ambulate to BR

## 2018-04-19 NOTE — Progress Notes (Signed)
**Note De-Identified  Obfuscation** Patient educated on MDI medication delivery with spacer.  Dial Check system used to measure proper flow

## 2018-04-19 NOTE — ED Provider Notes (Signed)
[facility] EMERGENCY DEPARTMENT Provider Note   CSN: [id number] Arrival date & time: 04/19/18  1310     History   Chief Complaint Chief Complaint  Patient presents with  . Generalized Body Aches    HPI Kathryn [name] is a 48 y.o. female with a past medical history significant for asthma, hypertension and chronic back pain presenting with a 2-day history of flulike symptoms including generalized body aches, intermittent fevers and chills, cough which has been productive of green sputum, shortness of breath and generalized fatigue.  She reports hurting "allover" and endorses generalized abdominal and chest pain which is triggered by coughing.   She has been wheezing, her last albuterol treatment was taken this morning.  She reports generalized fatigue and had multiple episodes of diarrhea yesterday evening, 2 episodes of emesis, once last night, the second was this am.  She denies dysuria or reduced urine output.  She has been able to tolerate PO intake but has been consuming less since ill, denies headache, neck pain, vomiting or diarrhea.   The history is provided by the patient.    Past Medical History:  Diagnosis Date  . Asthma   . Chronic back pain   . Hypertension   . Neuropathy     Patient Active Problem List   Diagnosis Date Noted  . Enlarged uterus 02/20/2018  . Screening for colorectal cancer 02/20/2018  . Routine cervical smear 02/20/2018  . Encounter for gynecological examination with Papanicolaou smear of cervix 02/20/2018  . Osteoarthritis of left knee 12/27/2017  . MDD (major depressive disorder), recurrent episode, severe ([place]) 12/24/2017  . Lumbar back pain 05/11/2017  . Depression, major, single episode, moderate ([place]) 05/11/2017  . Low vitamin B12 level 05/11/2017  . Immunization due 05/11/2017  . Hypertension goal BP (blood pressure) < 140/90 01/11/2017    Past Surgical History:  Procedure Laterality Date  . TUBAL LIGATION       OB History    Gravida  5   Para  3   Term  2   Preterm  1   AB  2   Living  3     SAB  1   TAB  1   Ectopic      Multiple      Live Births               Home Medications    Prior to Admission medications   Medication Sig Start Date End Date Taking? Authorizing Provider  albuterol (PROVENTIL HFA;VENTOLIN HFA) 108 (90 Base) MCG/ACT inhaler Inhale 2 puffs into the lungs 2 (two) times daily.    [provider]  albuterol (PROVENTIL) (2.5 MG/3ML) 0.083% nebulizer solution Take 2.5 mg by nebulization every 6 (six) hours as needed for wheezing or shortness of breath.    [provider]  azithromycin (ZITHROMAX Z-PAK) 250 MG tablet 1 tablet daily for 4 days. 04/19/18   Evalee Jefferson, PA-C  benzonatate (TESSALON) 100 MG capsule Take 2 capsules (200 mg total) by mouth 3 (three) times daily as needed. 04/19/18   Evalee Jefferson, PA-C  cholecalciferol (VITAMIN D3) 25 MCG (1000 UT) tablet Take 2,000 Units by mouth daily.    [provider]  DULoxetine (CYMBALTA) 60 MG capsule Take 1 capsule (60 mg total) by mouth daily. 12/29/17   Starkes-Perry, Gayland Curry, FNP  famotidine (PEPCID) 20 MG tablet Take 1 tablet (20 mg total) by mouth 2 (two) times daily. Patient not taking: Reported on 03/31/2018 02/06/18   Wilson Singer,  Annie Main, MD  fluticasone (VERAMYST) 27.5 MCG/SPRAY nasal spray Place 2 sprays into the nose daily.    [provider]  gabapentin (NEURONTIN) 300 MG capsule Take 1 capsule (300 mg total) by mouth 3 (three) times daily. 12/28/17   Starkes-Perry, Gayland Curry, FNP  hydrochlorothiazide (MICROZIDE) 12.5 MG capsule Take 12.5 mg by mouth daily.    [provider]  HYDROcodone-acetaminophen (NORCO/VICODIN) 5-325 MG tablet Take 1 every 6 hours for pain not relieved by Tylenol alone 03/27/18   [name], MD  loratadine (CLARITIN) 10 MG tablet Take 5 mg by mouth 2 (two) times daily.    [provider]  losartan (COZAAR) 50 MG tablet Take 50 mg by mouth daily.      [provider]  meclizine (ANTIVERT) 25 MG tablet Take 1 tablet (25 mg total) by mouth 3 (three) times daily as needed for dizziness or nausea. Patient not taking: Reported on 03/31/2018 02/06/18   Virgel Manifold, MD  mirtazapine (REMERON) 30 MG tablet Take 30 mg by mouth at bedtime.    [provider]  omeprazole (PRILOSEC) 20 MG capsule Take 1 po BID x 2 weeks then once a day 03/24/18   Rolland Porter, MD  ondansetron (ZOFRAN ODT) 4 MG disintegrating tablet 4mg  ODT q4 hours prn nausea/vomit Patient not taking: Reported on 03/31/2018 03/21/18   Milton Ferguson, MD  pantoprazole (PROTONIX) 20 MG tablet Take 1 tablet (20 mg total) by mouth daily. 03/21/18   Milton Ferguson, MD  potassium chloride SA (K-DUR,KLOR-CON) 20 MEQ tablet Take 1 tablet (20 mEq total) by mouth 2 (two) times daily. 04/19/18   Evalee Jefferson, PA-C  predniSONE (DELTASONE) 10 MG tablet Take 4 tablets (40 mg total) by mouth daily. 03/31/18   Fredia Sorrow, MD  promethazine-codeine (PHENERGAN WITH CODEINE) 6.25-10 MG/5ML syrup Take 5 mLs by mouth every 4 (four) hours as needed for cough. 04/19/18   Evalee Jefferson, PA-C  traZODone (DESYREL) 50 MG tablet Take 1 tablet (50 mg total) by mouth at bedtime. Patient not taking: Reported on 03/31/2018 12/28/17   Suella Broad, FNP    Family History Family History  Problem Relation Age of Onset  . Gout Paternal Grandfather   . Cirrhosis Paternal Grandfather   . Hypertension Paternal Grandmother   . Aneurysm Paternal Grandmother   . Cirrhosis Maternal Grandmother   . Cirrhosis Maternal Grandfather   . Cancer Father   . Cirrhosis Father   . Cirrhosis Mother   . Breast cancer Sister   . Hypertension Sister   . Bronchitis Daughter   . Bronchitis Daughter   . Asthma Son   . Bronchitis Son   . Migraines Neg Hx     Social History Social History   Tobacco Use  . Smoking status: Former Smoker    Types: Cigarettes  . Smokeless tobacco: Never Used  Substance Use  Topics  . Alcohol use: Yes    Comment: occ  . Drug use: Not Currently    Types: Cocaine    Comment: crack     Allergies   Clonopin [clonazepam]; Fish allergy; Flexeril [cyclobenzaprine hcl]; Ibuprofen; Ace inhibitors; and Tramadol   Review of Systems Review of Systems  Constitutional: Positive for chills and fever.  HENT: Positive for congestion and sore throat. Negative for ear pain, rhinorrhea, sinus pressure, trouble swallowing and voice change.   Eyes: Negative for discharge.  Respiratory: Positive for cough, shortness of breath and wheezing. Negative for stridor.   Cardiovascular: Negative for chest pain.  Gastrointestinal:  Positive for abdominal pain, diarrhea, nausea and vomiting.  Genitourinary: Negative.      Physical Exam Updated Vital Signs BP 117/66 (BP Location: Left Arm)   Pulse (!) 103   Temp 99.2 F (37.3 C) (Oral)   Resp 20   LMP 04/19/2018   SpO2 95%   Physical Exam Vitals signs and nursing note reviewed.  Constitutional:      Appearance: She is well-developed.  HENT:     Head: Normocephalic and atraumatic.     Right Ear: Tympanic membrane normal.     Left Ear: Tympanic membrane normal.     Nose: Congestion present.  Eyes:     Conjunctiva/sclera: Conjunctivae normal.  Neck:     Musculoskeletal: Normal range of motion.  Cardiovascular:     Rate and Rhythm: Normal rate and regular rhythm.     Heart sounds: Normal heart sounds. No murmur.  Pulmonary:     Effort: Pulmonary effort is normal.     Breath sounds: Wheezing and rhonchi present.     Comments: Coarse rhonchi throughout all lung fields.  There is wheezing at her bilateral bases with prolonged expirations.  Frequent cough. Abdominal:     General: Bowel sounds are normal.     Palpations: Abdomen is soft. There is no mass.     Tenderness: There is no abdominal tenderness. There is no guarding.  Musculoskeletal: Normal range of motion.  Skin:    General: Skin is warm and dry.      Findings: No rash.  Neurological:     General: No focal deficit present.     Mental Status: She is alert.      ED Treatments / Results  Labs (all labs ordered are listed, but only abnormal results are displayed) Labs Reviewed  COMPREHENSIVE METABOLIC PANEL - Abnormal; Notable for the following components:      Result Value   Potassium 2.9 (*)    Calcium 7.9 (*)    Total Protein 6.4 (*)    Albumin 3.4 (*)    All other components within normal limits  CBC WITH DIFFERENTIAL/PLATELET - Abnormal; Notable for the following components:   WBC 11.2 (*)    RBC 3.48 (*)    Hemoglobin 10.8 (*)    HCT 33.1 (*)    Platelets 445 (*)    Neutro Abs 7.8 (*)    All other components within normal limits  GROUP A STREP BY PCR  INFLUENZA PANEL BY PCR (TYPE A & B)  LIPASE, BLOOD  URINALYSIS, ROUTINE W REFLEX MICROSCOPIC  PREGNANCY, URINE    EKG None  Radiology Dg Chest 2 View  Result Date: 04/19/2018 CLINICAL DATA:  Cough and shortness of breath with wheezing EXAM: CHEST - 2 VIEW COMPARISON:  March 31, 2018 FINDINGS: Lungs are clear. The heart size and pulmonary vascularity are normal. No adenopathy. No bone lesions. IMPRESSION: No edema or consolidation. Electronically Signed   By: Lowella Grip III M.D.   On: 04/19/2018 14:06    Procedures Procedures (including critical care time)  Medications Ordered in ED Medications  sodium chloride 0.9 % bolus 1,000 mL (1,000 mLs Intravenous New Bag/Given 04/19/18 1646)  potassium chloride SA (K-DUR,KLOR-CON) CR tablet 20 mEq (has no administration in time range)  sodium chloride 0.9 % bolus 1,000 mL (0 mLs Intravenous Stopped 04/19/18 1552)  albuterol (PROVENTIL) (2.5 MG/3ML) 0.083% nebulizer solution 2.5 mg (2.5 mg Nebulization Given 04/19/18 1514)  ipratropium-albuterol (DUONEB) 0.5-2.5 (3) MG/3ML nebulizer solution 3 mL (3 mLs Nebulization Given 04/19/18  1514)  ondansetron (ZOFRAN) injection 4 mg (4 mg Intravenous Given 04/19/18 1452)    methylPREDNISolone sodium succinate (SOLU-MEDROL) 125 mg/2 mL injection 125 mg (125 mg Intravenous Given 04/19/18 1454)  guaiFENesin-codeine 100-10 MG/5ML solution 10 mL (10 mLs Oral Given 04/19/18 1632)  benzonatate (TESSALON) capsule 200 mg (200 mg Oral Given 04/19/18 1631)  azithromycin (ZITHROMAX) tablet 500 mg (500 mg Oral Given 04/19/18 1631)  acetaminophen (TYLENOL) tablet 1,000 mg (1,000 mg Oral Given 04/19/18 1631)     Initial Impression / Assessment and Plan / ED Course  I have reviewed the triage vital signs and the nursing notes.  Pertinent labs & imaging results that were available during my care of the patient were reviewed by me and considered in my medical decision making (see chart for details).     Patient with upper respiratory infection, suspect acute bronchitis given significant rhonchi, wheezing and productive cough.  Report of vomiting and diarrhea, none while here.  Her labs were reviewed and discussed with the patient, she is strep negative, influenza negative.  She was given IV fluids here, Zofran also given after which she was able to tolerate p.o. intake.  She was given a dose of Solu-Medrol per IV along with an albuterol Atrovent neb treatment.  She had no further wheezing but did continue to have fairly rhonchorous breath sounds and cough.  She was given IV fluid, 1 L after which she still remained borderline tachycardic.  Second liter was ordered.  Patient has not given [name] a urine sample at this time.  She was able to tolerate p.o. intake however.  Tessalon plus guaifenesin with codeine given for the cough, Tylenol for her body aches, was also started on Zithromax.  Patient with suspected flulike symptoms, although her influenza screen today was negative.  Patient with a very rhonchorous cough, will treat for acute bronchitis with Zithromax.  Labs significant for hypokalemia with a potassium of 2.9.  She was given oral supplementation here and will plan on continued  supplementation at home.  Still pending UA at this time.  She is getting her second liter of fluid currently.  Will plan discharge home if UA is normal with plans for rest, finishing Z-Pak, antitussives and nausea medicine.  Final Clinical Impressions(s) / ED Diagnoses   Final diagnoses:  Acute bronchitis, unspecified organism  Viral syndrome  Hypokalemia    ED Discharge Orders         Ordered    azithromycin (ZITHROMAX Z-PAK) 250 MG tablet     04/19/18 1710    benzonatate (TESSALON) 100 MG capsule  3 times daily PRN     04/19/18 1710    promethazine-codeine (PHENERGAN WITH CODEINE) 6.25-10 MG/5ML syrup  Every 4 hours PRN     04/19/18 1710    potassium chloride SA (K-DUR,KLOR-CON) 20 MEQ tablet  2 times daily     04/19/18 1711           Evalee Jefferson, PA-C 04/19/18 1714    [name], MD 04/20/18 1534

## 2018-04-19 NOTE — ED Provider Notes (Signed)
  Patient seen by Evalee Jefferson, PA-C and work-up completed by her, urinalysis pending at end of her shift and pt signed out to me for review of U/A results.      I have reviewed patient's urinalysis which shows moderate leukocytes and large amount of hemoglobin.  On further history from the patient she describes urinary frequency, burning with urination and suprapubic pain with voiding only.  No CVA tenderness on exam.  Her lung sounds remain rhonchorous without wheezing.  No hypoxia.  Albuterol MDI was dispensed prior to discharge along with instructions for use.  Patient reports that she is feeling better and requesting discharge home.  Urine culture is pending, I have written prescription for Keflex.    Labs Reviewed  COMPREHENSIVE METABOLIC PANEL - Abnormal; Notable for the following components:      Result Value   Potassium 2.9 (*)    Calcium 7.9 (*)    Total Protein 6.4 (*)    Albumin 3.4 (*)    All other components within normal limits  CBC WITH DIFFERENTIAL/PLATELET - Abnormal; Notable for the following components:   WBC 11.2 (*)    RBC 3.48 (*)    Hemoglobin 10.8 (*)    HCT 33.1 (*)    Platelets 445 (*)    Neutro Abs 7.8 (*)    All other components within normal limits  URINALYSIS, ROUTINE W REFLEX MICROSCOPIC - Abnormal; Notable for the following components:   Hgb urine dipstick LARGE (*)    Leukocytes, UA MODERATE (*)    All other components within normal limits  GROUP A STREP BY PCR  URINE CULTURE  INFLUENZA PANEL BY PCR (TYPE A & B)  PREGNANCY, URINE  LIPASE, BLOOD      Kem Parkinson, PA-C 04/19/18 1835    Margette Fast, MD 04/19/18 1928

## 2018-04-19 NOTE — Discharge Instructions (Addendum)
Rest to make sure you are drinking plenty of fluids.  Your potassium was low today which I suspect is due from the vomiting and the diarrhea that you had yesterday.  You have been prescribed cough medicine which will also help you with any nausea should it return.  Tessalon will also help you with the cough.  You have also been prescribed keflex for your urinary symptoms.  You have been given your first dose today so you do not need your next dose of this medication until tomorrow.

## 2018-04-19 NOTE — ED Triage Notes (Signed)
Pt has been having body aches, chills, and a cough since Wednesday. Cough and cold has gotten worse. States she did receive a flu shot in September

## 2018-04-20 NOTE — ED Notes (Signed)
Pt called requesting another cough medication be prescribed that is covered under medicaid.  Spoke with Dr. Lita Mains and he recommended not changing the prescription.  Reports if unable to get the tessalon filled, to get ano OTC cough medicine.

## 2018-04-21 LAB — URINE CULTURE

## 2018-04-22 ENCOUNTER — Ambulatory Visit: Payer: Medicaid Other | Admitting: Adult Health

## 2018-04-24 ENCOUNTER — Other Ambulatory Visit: Payer: Self-pay

## 2018-04-24 ENCOUNTER — Ambulatory Visit: Payer: Medicaid Other | Admitting: Obstetrics and Gynecology

## 2018-04-24 ENCOUNTER — Encounter: Payer: Self-pay | Admitting: Obstetrics and Gynecology

## 2018-04-24 VITALS — BP 122/80 | HR 103 | Ht 59.0 in | Wt 201.6 lb

## 2018-04-24 DIAGNOSIS — Z6841 Body Mass Index (BMI) 40.0 and over, adult: Secondary | ICD-10-CM | POA: Diagnosis not present

## 2018-04-24 DIAGNOSIS — R102 Pelvic and perineal pain: Secondary | ICD-10-CM

## 2018-04-24 NOTE — Patient Instructions (Signed)
Weight loss guidelines and tips ° ° °1. Utilize measuring cups and spoons (found at a low [place] at [employer]) to monitor and manage serving sizes. Use them to carefully measure out serving sizes listed on food packaging to prevent overeating and better manage caloric intake. Most of us tell ourselves lies about how much a serving really is. For example, a serving of meat is actually 4 oz, which is the size of a deck of cards!!!  ° °2. Utilize smart phone apps like "My Net Diary", "My Fitness Pal", "Lose It", or "Eat Better" to keep track of calorie intake and exercise. Also consider purchasing a pedometer to keep track of daily steps. Some smart phones have built in pedometers to keep track of step counts (your goal is 10,000 steps per day).  ° °3. Make sure you are consuming enough water daily. Drink 8 oz prior to meals, or at snack time, to cut down on overeating.  ° °4. Consider exercise programs. [employer] offers water aerobics classes that are low-impact to minimize joint pains while still burning calories. For maximum impact at home, do small exercises while watching TV during commercial breaks.  ° °The Truth Table:   °3,500 calories = 1 pound of fat  °Minus 500 calories a day x 7 days will lose you 1 pound  °Exercise helps, but one mile walk = 200 calories burned   °10,000 steps is your goal for the day, that's 4-5 miles!  ° °*Please cut out and put on your refrigerator*   °

## 2018-04-24 NOTE — Progress Notes (Signed)
Patient ID: QUETZALLY CALLAS, female   DOB: May 26, 1970, 48 y.o.   MRN: [id number]    [facility] Visit  @DATE @            Patient name: Kathryn [name] MRN [id number]  Date of birth: 1971-01-09  CC & HPI:  Kathryn [name] is a 48 y.o. female presenting today for discuss u/s fibroids. Hurts when she urinates and coughs   TA/TV U/s showed: Tech comment: heterogeneous anteverted uterus w/mult fibroids, largest fibroids: (#1) posterior subserosal fibroid 2.2 x 1.9 x 3 cm,(#2) anterior subserosal fibroid 2.5 x 2.6 x 1.5 cm,EEC 6.8 mm,normal ovaries bilat,ovaries appear mobile,no pain during ultrasound.  Impression:Uterus mildly enlarged with 2 small subserosal myomas, clinically insignificant, no endometrial distortion. Endometrium is normal Both ovaries are normal  ROS:  ROS +uterine fibroids +weight gain +urinary incontinence +hoarse vocals All systems are negative except as noted in the HPI and PMH.    Pertinent History Reviewed:   Reviewed:  Medical         Past Medical History:  Diagnosis Date  . Asthma   . Chronic back pain   . Hypertension   . Neuropathy                               Surgical Hx:    Past Surgical History:  Procedure Laterality Date  . TUBAL LIGATION     Medications: Reviewed & Updated - see associated section                       Current Outpatient Medications:  .  albuterol (PROVENTIL HFA;VENTOLIN HFA) 108 (90 Base) MCG/ACT inhaler, Inhale 2 puffs into the lungs 2 (two) times daily., Disp: , Rfl:  .  albuterol (PROVENTIL) (2.5 MG/3ML) 0.083% nebulizer solution, Take 2.5 mg by nebulization every 6 (six) hours as needed for wheezing or shortness of breath., Disp: , Rfl:  .  cephALEXin (KEFLEX) 500 MG capsule, Take 1 capsule (500 mg total) by mouth 4 (four) times daily., Disp: 28 capsule, Rfl: 0 .  cholecalciferol (VITAMIN D3) 25 MCG (1000 UT) tablet, Take 2,000 Units by mouth daily., Disp: , Rfl:  .  DULoxetine (CYMBALTA) 60 MG capsule, Take 1  capsule (60 mg total) by mouth daily., Disp: 30 capsule, Rfl: 0 .  famotidine (PEPCID) 20 MG tablet, Take 1 tablet (20 mg total) by mouth 2 (two) times daily., Disp: 30 tablet, Rfl: 0 .  fluticasone (VERAMYST) 27.5 MCG/SPRAY nasal spray, Place 2 sprays into the nose daily., Disp: , Rfl:  .  gabapentin (NEURONTIN) 300 MG capsule, Take 1 capsule (300 mg total) by mouth 3 (three) times daily., Disp: 30 capsule, Rfl: 0 .  hydrochlorothiazide (MICROZIDE) 12.5 MG capsule, Take 12.5 mg by mouth daily., Disp: , Rfl:  .  loratadine (CLARITIN) 10 MG tablet, Take 5 mg by mouth 2 (two) times daily., Disp: , Rfl:  .  losartan (COZAAR) 50 MG tablet, Take 50 mg by mouth daily. , Disp: , Rfl:  .  omeprazole (PRILOSEC) 20 MG capsule, Take 1 po BID x 2 weeks then once a day, Disp: 60 capsule, Rfl: 0 .  ondansetron (ZOFRAN ODT) 4 MG disintegrating tablet, 4mg  ODT q4 hours prn nausea/vomit, Disp: 12 tablet, Rfl: 0 .  pantoprazole (PROTONIX) 20 MG tablet, Take 1 tablet (20 mg total) by mouth daily., Disp: 30 tablet, Rfl: 0 .  potassium chloride SA (K-DUR,KLOR-CON)  20 MEQ tablet, Take 1 tablet (20 mEq total) by mouth 2 (two) times daily., Disp: 10 tablet, Rfl: 0 .  promethazine-codeine (PHENERGAN WITH CODEINE) 6.25-10 MG/5ML syrup, Take 5 mLs by mouth every 4 (four) hours as needed for cough., Disp: 120 mL, Rfl: 0 .  traZODone (DESYREL) 50 MG tablet, Take 1 tablet (50 mg total) by mouth at bedtime., Disp: 30 tablet, Rfl: 0 .  benzonatate (TESSALON) 100 MG capsule, Take 2 capsules (200 mg total) by mouth 3 (three) times daily as needed. (Patient not taking: Reported on 04/24/2018), Disp: 30 capsule, Rfl: 0 .  HYDROcodone-acetaminophen (NORCO/VICODIN) 5-325 MG tablet, Take 1 every 6 hours for pain not relieved by Tylenol alone (Patient not taking: Reported on 04/24/2018), Disp: 10 tablet, Rfl: 0   Social History: Reviewed -  reports that she has quit smoking. Her smoking use included cigarettes. She has never used smokeless  tobacco.  Objective Findings:  Vitals: Blood pressure 122/80, pulse (!) 103, height 4\' 11"  (1.499 m), weight 201 lb 9.6 oz (91.4 kg), last menstrual period 04/19/2018.  PHYSICAL EXAMINATION General appearance - alert, hoarse vocals and congested cough, and in no distress and oriented to person, place, and time Mental status - normal mood, behavior, speech, dress, motor activity, and thought processes, affect appropriate to mood  PELVIC Not done   Discussion: Discussed with pt weight loss methods including food measurement and calorie counting using apps such as MyNetDiary or My Fitness Pal. Recommended the use of measuring cups and spoons to monitor serving sizes. Encouraged adequate daily water intake, especially prior to meals to eliminate overeating. Additionally encouraged pt to become more active by taking daily walks of at least 30 minute duration, join a local gym such as the [facility] or attend water aerobics classes. Also advised pt to use pedometer on smartphone or utilize a smartband such as FitBit to keep track of daily activity.   At end of discussion, pt had opportunity to ask questions and has no further questions at this time.   Specific discussion of lifestyle changes, behavioral modifications and healthy eating habits as noted above. Greater than 50% was spent in counseling and coordination of care with the patient.  Total time greater than: 15 minutes.    Assessment & Plan:   A:  1. Subserosal myomas, small 2. Urinary incontinence 3. Weight gain  P:  1. Weight loss 2. F/u PRN   By signing my name below, I, Samul Dada, attest that this documentation has been prepared under the direction and in the presence of Jonnie Kind, MD. Electronically Signed: [place]. 04/24/18. 2:33 PM.  I personally performed the services described in this documentation, which was SCRIBED in my presence. The recorded information has been reviewed and considered  accurate. It has been edited as necessary during review. Jonnie Kind, MD

## 2018-04-26 ENCOUNTER — Encounter (HOSPITAL_COMMUNITY): Payer: Self-pay

## 2018-04-26 ENCOUNTER — Emergency Department (HOSPITAL_COMMUNITY)
Admission: EM | Admit: 2018-04-26 | Discharge: 2018-04-26 | Disposition: A | Discharge status: Home / Self Care | Payer: Medicaid Other | Attending: Emergency Medicine | Admitting: Emergency Medicine

## 2018-04-26 ENCOUNTER — Other Ambulatory Visit: Payer: Self-pay

## 2018-04-26 ENCOUNTER — Emergency Department (HOSPITAL_COMMUNITY): Payer: Medicaid Other

## 2018-04-26 DIAGNOSIS — I10 Essential (primary) hypertension: Secondary | ICD-10-CM | POA: Insufficient documentation

## 2018-04-26 DIAGNOSIS — R05 Cough: Secondary | ICD-10-CM | POA: Diagnosis present

## 2018-04-26 DIAGNOSIS — Z79899 Other long term (current) drug therapy: Secondary | ICD-10-CM | POA: Insufficient documentation

## 2018-04-26 DIAGNOSIS — R0789 Other chest pain: Secondary | ICD-10-CM

## 2018-04-26 DIAGNOSIS — Z87891 Personal history of nicotine dependence: Secondary | ICD-10-CM | POA: Diagnosis not present

## 2018-04-26 DIAGNOSIS — J209 Acute bronchitis, unspecified: Secondary | ICD-10-CM | POA: Diagnosis not present

## 2018-04-26 LAB — D-DIMER, QUANTITATIVE: D-Dimer, Quant: 0.36 ug/mL-FEU (ref 0.00–0.50)

## 2018-04-26 MED ORDER — METHOCARBAMOL 1000 MG/10ML IJ SOLN
500.0000 mg | Freq: Once | INTRAVENOUS | Status: AC
  Administered 2018-04-26: 500 mg via INTRAVENOUS
  Filled 2018-04-26: qty 500

## 2018-04-26 MED ORDER — OXYCODONE HCL 5 MG PO TABS
5.0000 mg | ORAL_TABLET | Freq: Once | ORAL | Status: AC
  Administered 2018-04-26: 5 mg via ORAL
  Filled 2018-04-26: qty 1

## 2018-04-26 MED ORDER — DEXAMETHASONE 4 MG PO TABS: 4.0000 mg | ORAL_TABLET | Freq: Two times a day (BID) | ORAL | 0 refills | Status: DC

## 2018-04-26 MED ORDER — HYDROCODONE-ACETAMINOPHEN 5-325 MG PO TABS: 1.0000 | ORAL_TABLET | ORAL | 0 refills | Status: DC | PRN

## 2018-04-26 MED ORDER — IPRATROPIUM BROMIDE 0.02 % IN SOLN: 0.5000 mg | Freq: Once | RESPIRATORY_TRACT | Status: DC

## 2018-04-26 MED ORDER — PREDNISONE 20 MG PO TABS
40.0000 mg | ORAL_TABLET | Freq: Once | ORAL | Status: AC
  Administered 2018-04-26: 40 mg via ORAL
  Filled 2018-04-26: qty 2

## 2018-04-26 MED ORDER — ALBUTEROL SULFATE (2.5 MG/3ML) 0.083% IN NEBU
2.5000 mg | INHALATION_SOLUTION | Freq: Once | RESPIRATORY_TRACT | Status: AC
  Administered 2018-04-26: 2.5 mg via RESPIRATORY_TRACT
  Filled 2018-04-26: qty 3

## 2018-04-26 MED ORDER — AZITHROMYCIN 250 MG PO TABS: 250.0000 mg | ORAL_TABLET | Freq: Every day | ORAL | 0 refills | Status: DC

## 2018-04-26 MED ORDER — ONDANSETRON 4 MG PO TBDP
4.0000 mg | ORAL_TABLET | Freq: Once | ORAL | Status: AC
  Administered 2018-04-26: 4 mg via ORAL
  Filled 2018-04-26: qty 1

## 2018-04-26 MED ORDER — ALBUTEROL SULFATE (2.5 MG/3ML) 0.083% IN NEBU: 5.0000 mg | INHALATION_SOLUTION | Freq: Once | RESPIRATORY_TRACT | Status: DC

## 2018-04-26 MED ORDER — ALBUTEROL SULFATE HFA 108 (90 BASE) MCG/ACT IN AERS
2.0000 | INHALATION_SPRAY | Freq: Once | RESPIRATORY_TRACT | Status: AC
  Administered 2018-04-26: 2 via RESPIRATORY_TRACT
  Filled 2018-04-26: qty 6.7

## 2018-04-26 MED ORDER — IPRATROPIUM-ALBUTEROL 0.5-2.5 (3) MG/3ML IN SOLN
3.0000 mL | Freq: Once | RESPIRATORY_TRACT | Status: AC
  Administered 2018-04-26: 3 mL via RESPIRATORY_TRACT
  Filled 2018-04-26: qty 3

## 2018-04-26 MED ORDER — ONDANSETRON HCL 4 MG PO TABS: 4.0000 mg | ORAL_TABLET | Freq: Four times a day (QID) | ORAL | 0 refills | Status: DC

## 2018-04-26 NOTE — ED Notes (Signed)
[place],PAC to bedside to discuss findings and plan of care for discharge.

## 2018-04-26 NOTE — ED Provider Notes (Signed)
[facility] EMERGENCY DEPARTMENT Provider Note   CSN: [id number] Arrival date & time: 04/26/18  0092     History   Chief Complaint Chief Complaint  Patient presents with  . Cough    HPI Kathryn GARCIAGARCIA is a 48 y.o. female.  The history is provided by the patient.  Cough  Cough characteristics:  Non-productive Severity:  Moderate Onset quality:  Gradual Duration:  2 weeks Timing:  Constant Progression:  Worsening Chronicity:  New Smoker: no   Context: upper respiratory infection and weather changes   Context: not sick contacts   Relieved by:  Nothing Worsened by:  Activity Ineffective treatments:  Cough suppressants (cough syrup) Associated symptoms: chills, diaphoresis, myalgias, shortness of breath, sinus congestion and wheezing   Associated symptoms: no eye discharge   Associated symptoms comment:  Chest wall pain Risk factors: no recent travel     Past Medical History:  Diagnosis Date  . Asthma   . Chronic back pain   . Hypertension   . Neuropathy     Patient Active Problem List   Diagnosis Date Noted  . Enlarged uterus 02/20/2018  . Screening for colorectal cancer 02/20/2018  . Routine cervical smear 02/20/2018  . Encounter for gynecological examination with Papanicolaou smear of cervix 02/20/2018  . Osteoarthritis of left knee 12/27/2017  . MDD (major depressive disorder), recurrent episode, severe ([place]) 12/24/2017  . Lumbar back pain 05/11/2017  . Depression, major, single episode, moderate ([place]) 05/11/2017  . Low vitamin B12 level 05/11/2017  . Immunization due 05/11/2017  . Hypertension goal BP (blood pressure) < 140/90 01/11/2017    Past Surgical History:  Procedure Laterality Date  . TUBAL LIGATION       OB History    Gravida  5   Para  3   Term  2   Preterm  1   AB  2   Living  3     SAB  1   TAB  1   Ectopic      Multiple      Live Births               Home Medications    Prior to Admission medications     Medication Sig Start Date End Date Taking? Authorizing Provider  albuterol (PROVENTIL HFA;VENTOLIN HFA) 108 (90 Base) MCG/ACT inhaler Inhale 2 puffs into the lungs 2 (two) times daily.    [provider]  albuterol (PROVENTIL) (2.5 MG/3ML) 0.083% nebulizer solution Take 2.5 mg by nebulization every 6 (six) hours as needed for wheezing or shortness of breath.    [provider]  benzonatate (TESSALON) 100 MG capsule Take 2 capsules (200 mg total) by mouth 3 (three) times daily as needed. Patient not taking: Reported on 04/24/2018 04/19/18   Kathryn Jefferson, PA-C  cephALEXin (KEFLEX) 500 MG capsule Take 1 capsule (500 mg total) by mouth 4 (four) times daily. 04/19/18   Triplett, Tammy, PA-C  cholecalciferol (VITAMIN D3) 25 MCG (1000 UT) tablet Take 2,000 Units by mouth daily.    [provider]  DULoxetine (CYMBALTA) 60 MG capsule Take 1 capsule (60 mg total) by mouth daily. 12/29/17   Starkes-Perry, Gayland Curry, FNP  famotidine (PEPCID) 20 MG tablet Take 1 tablet (20 mg total) by mouth 2 (two) times daily. 02/06/18   Virgel Manifold, MD  fluticasone (VERAMYST) 27.5 MCG/SPRAY nasal spray Place 2 sprays into the nose daily.    [provider]  gabapentin (NEURONTIN) 300 MG capsule Take 1 capsule (  300 mg total) by mouth 3 (three) times daily. 12/28/17   Starkes-Perry, Gayland Curry, FNP  hydrochlorothiazide (MICROZIDE) 12.5 MG capsule Take 12.5 mg by mouth daily.    [provider]  HYDROcodone-acetaminophen (NORCO/VICODIN) 5-325 MG tablet Take 1 every 6 hours for pain not relieved by Tylenol alone Patient not taking: Reported on 04/24/2018 03/27/18   [name], MD  loratadine (CLARITIN) 10 MG tablet Take 5 mg by mouth 2 (two) times daily.    [provider]  losartan (COZAAR) 50 MG tablet Take 50 mg by mouth daily.     [provider]  omeprazole (PRILOSEC) 20 MG capsule Take 1 po BID x 2 weeks then once a day 03/24/18   Rolland Porter, MD  ondansetron (ZOFRAN  ODT) 4 MG disintegrating tablet 4mg  ODT q4 hours prn nausea/vomit 03/21/18   Milton Ferguson, MD  pantoprazole (PROTONIX) 20 MG tablet Take 1 tablet (20 mg total) by mouth daily. 03/21/18   Milton Ferguson, MD  potassium chloride SA (K-DUR,KLOR-CON) 20 MEQ tablet Take 1 tablet (20 mEq total) by mouth 2 (two) times daily. 04/19/18   Kathryn Jefferson, PA-C  promethazine-codeine (PHENERGAN WITH CODEINE) 6.25-10 MG/5ML syrup Take 5 mLs by mouth every 4 (four) hours as needed for cough. 04/19/18   Kathryn Jefferson, PA-C  traZODone (DESYREL) 50 MG tablet Take 1 tablet (50 mg total) by mouth at bedtime. 12/28/17   Suella Broad, FNP    Family History Family History  Problem Relation Age of Onset  . Gout Paternal Grandfather   . Cirrhosis Paternal Grandfather   . Hypertension Paternal Grandmother   . Aneurysm Paternal Grandmother   . Cirrhosis Maternal Grandmother   . Cirrhosis Maternal Grandfather   . Cancer Father   . Cirrhosis Father   . Cirrhosis Mother   . Breast cancer Sister   . Hypertension Sister   . Bronchitis Daughter   . Bronchitis Daughter   . Asthma Son   . Bronchitis Son   . Migraines Neg Hx     Social History Social History   Tobacco Use  . Smoking status: Former Smoker    Types: Cigarettes  . Smokeless tobacco: Never Used  Substance Use Topics  . Alcohol use: Yes    Comment: occ  . Drug use: Not Currently    Types: Cocaine    Comment: crack     Allergies   Clonopin [clonazepam]; Fish allergy; Flexeril [cyclobenzaprine hcl]; Ibuprofen; Ace inhibitors; and Tramadol   Review of Systems Review of Systems  Constitutional: Positive for chills and diaphoresis. Negative for activity change.       All ROS Neg except as noted in HPI  HENT: Positive for congestion. Negative for nosebleeds.   Eyes: Negative for photophobia and discharge.  Respiratory: Positive for cough, shortness of breath and wheezing.   Cardiovascular: Negative for palpitations.  Gastrointestinal:  Negative for abdominal pain and blood in stool.  Genitourinary: Negative for dysuria, frequency and hematuria.  Musculoskeletal: Positive for myalgias. Negative for arthralgias, back pain and neck pain.  Skin: Negative.   Neurological: Negative for dizziness, seizures and speech difficulty.  Psychiatric/Behavioral: Negative for confusion and hallucinations.     Physical Exam Updated Vital Signs BP 121/85 (BP Location: Left Arm)   Pulse 98   Temp 98.2 F (36.8 C) (Oral)   Resp 20   Ht 4\' 11"  (1.499 m)   Wt 85.7 kg   LMP 04/19/2018   SpO2 100%   BMI 38.17 kg/m   Physical Exam  Vitals signs and nursing note reviewed.  Constitutional:      Appearance: She is well-developed. She is not toxic-appearing.  HENT:     Head: Normocephalic.     Right Ear: Tympanic membrane and external ear normal.     Left Ear: Tympanic membrane and external ear normal.  Eyes:     General: Lids are normal.     Pupils: Pupils are equal, round, and reactive to light.  Neck:     Musculoskeletal: Normal range of motion and neck supple.     Vascular: No carotid bruit.  Cardiovascular:     Rate and Rhythm: Normal rate and regular rhythm.     Pulses: Normal pulses.     Heart sounds: Normal heart sounds.  Pulmonary:     Effort: No respiratory distress.     Breath sounds: Normal breath sounds.     Comments: Patient has frequent cough during examination.  There is a light of rattling, and rhonchi and few wheezes end expiratory on examination.  The patient expresses great deal of pain with cough primarily at the center of her chest and at the lower rib areas on the right and the left.  There is symmetrical rise and fall of the chest. Abdominal:     General: Bowel sounds are normal.     Palpations: Abdomen is soft.     Tenderness: There is no abdominal tenderness. There is no guarding.  Musculoskeletal: Normal range of motion.     Comments: Capillary refill is less than 2 seconds.  There is no pitting  edema of the lower extremities.    Negative Homans sign  Lymphadenopathy:     Head:     Right side of head: No submandibular adenopathy.     Left side of head: No submandibular adenopathy.     Cervical: No cervical adenopathy.  Skin:    General: Skin is warm and dry.  Neurological:     Mental Status: She is alert and oriented to person, place, and time.     Cranial Nerves: No cranial nerve deficit.     Sensory: No sensory deficit.  Psychiatric:        Speech: Speech normal.      ED Treatments / Results  Labs (all labs ordered are listed, but only abnormal results are displayed) Labs Reviewed - No data to display  EKG None  Radiology No results found.  Procedures Procedures (including critical care time)  Medications Ordered in ED Medications  albuterol (PROVENTIL) (2.5 MG/3ML) 0.083% nebulizer solution 5 mg (has no administration in time range)     Initial Impression / Assessment and Plan / ED Course  I have reviewed the triage vital signs and the nursing notes.  Pertinent labs & imaging results that were available during my care of the patient were reviewed by me and considered in my medical decision making (see chart for details).       Final Clinical Impressions(s) / ED Diagnoses MDM Vital signs reviewed.  Pulse oximetry is 98 100% on room air.  Patient has a rattling cough frequent coughing during examination or speaking.  She has pain to palpation of the sternal area and also of the right and left lower rib area.  Will obtain chest x-ray, will order breathing treatments and medication for pain.  Chest x-ray shows no acute cardiopulmonary disease.  There was a delay in the patient receiving medication for pain.  Recheck after Roxicodone and oral steroid.  Patient states she has noted  minimal improvement in her pain.  She feels like she is breathing a little better after the breathing treatment.  Albuterol inhaler ordered.  Recheck.  Patient states she is  still having great deal of pain with taking a deep breath and with certain movement.  Her pulse oximetry continues to be steady at 98 to 100% on room air.  The patient speaks mostly in complete sentences, the cough is slightly improved.  We will try IV Robaxin to assist with the pain.  Patient has an anaphylactic type reaction to ibuprofen.  We will also obtain a d-dimer.  Recheck.  The d-dimer is negative.  Patient states she got only minimal relief from the IV Robaxin.  She does state it feels a little better though.  She is able to cough more of the phlegm up now.  She states however that when she coughs her chest wall just feels like it is tearing apart..  Patient ambulated in the room and well is in the hall with no desaturation.  No chest pain other than chest wall pain when getting in and out of bed, and with coughing.  I discussed the findings on examination as well as the findings on the x-ray with the patient in terms of which he understands.  We discussed the plan together.  The patient will use albuterol 2 puffs every 4 hours.  She will use Decadron 2 times daily.  She will use Tylenol every 4 hours for pain.  She will use Norco for more severe pain.  Patient also given a prescription for Zofran, as she has nausea/vomiting intolerance to several medications.  The patient is to be seen either by Dr. Maudie Mercury or return to the emergency department in 4 or 5days for recheck of her condition.  Patient is in agreement with this plan.   Final diagnoses:  Acute bronchitis, unspecified organism  Chest wall pain    ED Discharge Orders         Ordered    HYDROcodone-acetaminophen (NORCO/VICODIN) 5-325 MG tablet  Every 4 hours PRN     04/26/18 1404    azithromycin (ZITHROMAX) 250 MG tablet  Daily     04/26/18 1404    dexamethasone (DECADRON) 4 MG tablet  2 times daily with meals,   Status:  Discontinued     04/26/18 1404    ondansetron (ZOFRAN) 4 MG tablet  Every 6 hours     04/26/18 1404     dexamethasone (DECADRON) 4 MG tablet  2 times daily with meals     04/26/18 1412           Lily Kocher, PA-C 04/26/18 1654    Julianne Rice, MD 04/30/18 [phone]

## 2018-04-26 NOTE — Discharge Instructions (Addendum)
Your oxygen level remains stable throughout the emergency department visit.  Your chest x-ray is negative for pneumonia.  Your examination suggest acute bronchitis.  Please use 2 puffs of albuterol every 4 hours.  Use Decadron 2 times daily.  Use Zithromax 2 tablets first day, and then 1 tablet daily until all taken.  Use Tylenol extra strength for mild pain and soreness of the chest and chest wall.  Use hydrocodone for more severe pain. This medication may cause drowsiness. Please do not drink, drive, or participate in activity that requires concentration while taking this medication.  Please have your lung exam and chest wall rechecked in the next 3 to 5 days, either by Dr. Maudie Mercury or a member of his team, or return to the emergency department.  Please increase fluids.  Please use a mask until symptoms have resolved.

## 2018-04-26 NOTE — ED Triage Notes (Signed)
Pt presents to ED with complaints of on going cough x 2 weeks. Pt states cough is non-productive. Pt states she hurts in her chest only when she coughs.

## 2018-04-26 NOTE — ED Notes (Signed)
DISCHARGED TO HOME WITH RIDE. PT AWAKE,ALERT AND ORIENTED X 3. SKIN WARM AND DRY. SPEECH CLEAR. VITAL SIGNS STABLE.

## 2018-04-26 NOTE — ED Notes (Signed)
Patient ambulated in hall.  O2 sat was 96-99% while walking.

## 2018-04-26 NOTE — ED Notes (Signed)
Pt reports no change in pain status with medications given.

## 2018-05-10 ENCOUNTER — Other Ambulatory Visit: Payer: Self-pay

## 2018-05-10 ENCOUNTER — Emergency Department (HOSPITAL_COMMUNITY)
Admission: EM | Admit: 2018-05-10 | Discharge: 2018-05-10 | Disposition: A | Discharge status: Home / Self Care | Payer: Medicaid Other | Attending: Emergency Medicine | Admitting: Emergency Medicine

## 2018-05-10 ENCOUNTER — Emergency Department (HOSPITAL_COMMUNITY): Payer: Medicaid Other

## 2018-05-10 ENCOUNTER — Encounter (HOSPITAL_COMMUNITY): Payer: Self-pay

## 2018-05-10 DIAGNOSIS — F329 Major depressive disorder, single episode, unspecified: Secondary | ICD-10-CM | POA: Insufficient documentation

## 2018-05-10 DIAGNOSIS — I10 Essential (primary) hypertension: Secondary | ICD-10-CM | POA: Diagnosis not present

## 2018-05-10 DIAGNOSIS — Z79899 Other long term (current) drug therapy: Secondary | ICD-10-CM | POA: Diagnosis not present

## 2018-05-10 DIAGNOSIS — Z87891 Personal history of nicotine dependence: Secondary | ICD-10-CM | POA: Insufficient documentation

## 2018-05-10 DIAGNOSIS — R0600 Dyspnea, unspecified: Secondary | ICD-10-CM | POA: Diagnosis present

## 2018-05-10 DIAGNOSIS — J40 Bronchitis, not specified as acute or chronic: Secondary | ICD-10-CM

## 2018-05-10 DIAGNOSIS — R0789 Other chest pain: Secondary | ICD-10-CM

## 2018-05-10 LAB — COMPREHENSIVE METABOLIC PANEL
ALT: 21 U/L (ref 0–44)
AST: 17 U/L (ref 15–41)
Albumin: 3.6 g/dL (ref 3.5–5.0)
Alkaline Phosphatase: 57 U/L (ref 38–126)
Anion gap: 8 (ref 5–15)
BUN: 10 mg/dL (ref 6–20)
CO2: 25 mmol/L (ref 22–32)
Calcium: 9.3 mg/dL (ref 8.9–10.3)
Chloride: 104 mmol/L (ref 98–111)
Creatinine, Ser: 0.78 mg/dL (ref 0.44–1.00)
GFR calc non Af Amer: >60 mL/min (ref >60)
Glucose, Bld: 77 mg/dL (ref 70–99)
Potassium: 3.7 mmol/L (ref 3.5–5.1)
Sodium: 137 mmol/L (ref 135–145)
TOTAL PROTEIN: 7.3 g/dL (ref 6.5–8.1)
Total Bilirubin: 0.3 mg/dL (ref 0.3–1.2)

## 2018-05-10 LAB — CBC WITH DIFFERENTIAL/PLATELET
Abs Immature Granulocytes: 0.16 10*3/uL — ABNORMAL HIGH (ref 0.00–0.07)
BASOS ABS: 0 10*3/uL (ref 0.0–0.1)
Basophils Relative: 0 %
Eosinophils Absolute: 0.1 10*3/uL (ref 0.0–0.5)
Eosinophils Relative: 1 %
HCT: 36.3 % (ref 36.0–46.0)
Hemoglobin: 11.4 g/dL — ABNORMAL LOW (ref 12.0–15.0)
Immature Granulocytes: 2 %
Lymphocytes Relative: 33 %
Lymphs Abs: 2.9 10*3/uL (ref 0.7–4.0)
MCH: 29.8 pg (ref 26.0–34.0)
MCHC: 31.4 g/dL (ref 30.0–36.0)
MCV: 95 fL (ref 80.0–100.0)
Monocytes Absolute: 0.7 10*3/uL (ref 0.1–1.0)
Monocytes Relative: 8 %
Neutro Abs: 4.9 10*3/uL (ref 1.7–7.7)
Neutrophils Relative %: 56 %
Platelets: 457 10*3/uL — ABNORMAL HIGH (ref 150–400)
RBC: 3.82 MIL/uL — ABNORMAL LOW (ref 3.87–5.11)
RDW: 12.9 % (ref 11.5–15.5)
WBC: 8.9 10*3/uL (ref 4.0–10.5)
nRBC: 0 % (ref 0.0–0.2)

## 2018-05-10 LAB — TROPONIN I: Troponin I: <0.03 ng/mL (ref <0.03)

## 2018-05-10 LAB — BRAIN NATRIURETIC PEPTIDE: B Natriuretic Peptide: 14 pg/mL (ref 0.0–100.0)

## 2018-05-10 MED ORDER — ONDANSETRON 4 MG PO TBDP
4.0000 mg | ORAL_TABLET | Freq: Once | ORAL | Status: AC
  Administered 2018-05-10: 4 mg via ORAL
  Filled 2018-05-10: qty 1

## 2018-05-10 MED ORDER — ALBUTEROL SULFATE (2.5 MG/3ML) 0.083% IN NEBU
2.5000 mg | INHALATION_SOLUTION | Freq: Once | RESPIRATORY_TRACT | Status: AC
  Administered 2018-05-10: 2.5 mg via RESPIRATORY_TRACT
  Filled 2018-05-10: qty 3

## 2018-05-10 MED ORDER — PROMETHAZINE-DM 6.25-15 MG/5ML PO SYRP: 5.0000 mL | ORAL_SOLUTION | Freq: Four times a day (QID) | ORAL | 0 refills | Status: DC | PRN

## 2018-05-10 MED ORDER — HYDROCODONE-ACETAMINOPHEN 5-325 MG PO TABS
2.0000 | ORAL_TABLET | Freq: Once | ORAL | Status: AC
  Administered 2018-05-10: 2 via ORAL
  Filled 2018-05-10: qty 2

## 2018-05-10 MED ORDER — IOPAMIDOL (ISOVUE-370) INJECTION 76%
100.0000 mL | Freq: Once | INTRAVENOUS | Status: AC | PRN
  Administered 2018-05-10: 100 mL via INTRAVENOUS

## 2018-05-10 MED ORDER — ALBUTEROL SULFATE HFA 108 (90 BASE) MCG/ACT IN AERS
2.0000 | INHALATION_SPRAY | Freq: Once | RESPIRATORY_TRACT | Status: AC
  Administered 2018-05-10: 2 via RESPIRATORY_TRACT
  Filled 2018-05-10: qty 6.7

## 2018-05-10 MED ORDER — DEXAMETHASONE 4 MG PO TABS: 4.0000 mg | ORAL_TABLET | Freq: Two times a day (BID) | ORAL | 0 refills | Status: DC

## 2018-05-10 NOTE — ED Provider Notes (Signed)
[facility] EMERGENCY DEPARTMENT Provider Note   CSN: [id number] Arrival date & time: 05/10/18  4174     History   Chief Complaint Chief Complaint  Patient presents with  . Cough    HPI Kathryn [name] is a 48 y.o. female.  Patient is a 48 year old female who presents to the emergency department for assistance with problems with coughing and pain in the chest.  The patient states this problem started at the beginning of January of this year.  She was seen in the emergency department on January 3 at which time she was noted to have low potassium.  She was treated with albuterol, steroids, cough medication, and Zithromax.  The patient got some mild relief but was back in the emergency department on January 10 with similar symptoms.  At that time a d-dimer test was negative.  The patient was treated for acute bronchitis and upper respiratory infection.  The patient returns today because she continues to have pain in the sternal area pain in the rib area with cough, with deep breath, and even with certain movements.  No hemoptysis reported.  She says that it is hard to breathe at times.  She has some pain that starts in the sternal area and goes around to the back.  This pain got pretty particularly worse on yesterday January 23.  Patient is having problems with chills.  There is been no known exposure to anyone sick.  The patient does state however that she is recently visited her father in the hospital.  The patient has not had any recent travel outside of the country.  The history is provided by the patient.  Cough  Associated symptoms: myalgias   Associated symptoms: no chest pain, no eye discharge, no shortness of breath and no wheezing     Past Medical History:  Diagnosis Date  . Asthma   . Chronic back pain   . Hypertension   . Neuropathy     Patient Active Problem List   Diagnosis Date Noted  . Enlarged uterus 02/20/2018  . Screening for colorectal cancer 02/20/2018  .  Routine cervical smear 02/20/2018  . Encounter for gynecological examination with Papanicolaou smear of cervix 02/20/2018  . Osteoarthritis of left knee 12/27/2017  . MDD (major depressive disorder), recurrent episode, severe ([place]) 12/24/2017  . Lumbar back pain 05/11/2017  . Depression, major, single episode, moderate ([place]) 05/11/2017  . Low vitamin B12 level 05/11/2017  . Immunization due 05/11/2017  . Hypertension goal BP (blood pressure) < 140/90 01/11/2017    Past Surgical History:  Procedure Laterality Date  . TUBAL LIGATION       OB History    Gravida  5   Para  3   Term  2   Preterm  1   AB  2   Living  3     SAB  1   TAB  1   Ectopic      Multiple      Live Births               Home Medications    Prior to Admission medications   Medication Sig Start Date End Date Taking? Authorizing Provider  albuterol (PROVENTIL HFA;VENTOLIN HFA) 108 (90 Base) MCG/ACT inhaler Inhale 2 puffs into the lungs 2 (two) times daily.    [provider]  albuterol (PROVENTIL) (2.5 MG/3ML) 0.083% nebulizer solution Take 2.5 mg by nebulization every 6 (six) hours as needed for wheezing or shortness of breath.  [provider]  azithromycin (ZITHROMAX) 250 MG tablet Take 1 tablet (250 mg total) by mouth daily. Take first 2 tablets together, then 1 every day until finished. 04/26/18   Lily Kocher, PA-C  benzonatate (TESSALON) 100 MG capsule Take 2 capsules (200 mg total) by mouth 3 (three) times daily as needed. Patient not taking: Reported on 04/24/2018 04/19/18   Evalee Jefferson, PA-C  cephALEXin (KEFLEX) 500 MG capsule Take 1 capsule (500 mg total) by mouth 4 (four) times daily. 04/19/18   Triplett, Tammy, PA-C  cholecalciferol (VITAMIN D3) 25 MCG (1000 UT) tablet Take 2,000 Units by mouth daily.    [provider]  dexamethasone (DECADRON) 4 MG tablet Take 1 tablet (4 mg total) by mouth 2 (two) times daily with a meal. 04/26/18   Lily Kocher, PA-C    DULoxetine (CYMBALTA) 60 MG capsule Take 1 capsule (60 mg total) by mouth daily. 12/29/17   Starkes-Perry, Gayland Curry, FNP  famotidine (PEPCID) 20 MG tablet Take 1 tablet (20 mg total) by mouth 2 (two) times daily. 02/06/18   Virgel Manifold, MD  fluticasone (VERAMYST) 27.5 MCG/SPRAY nasal spray Place 2 sprays into the nose daily.    [provider]  gabapentin (NEURONTIN) 300 MG capsule Take 1 capsule (300 mg total) by mouth 3 (three) times daily. 12/28/17   Starkes-Perry, Gayland Curry, FNP  hydrochlorothiazide (MICROZIDE) 12.5 MG capsule Take 12.5 mg by mouth daily.    [provider]  HYDROcodone-acetaminophen (NORCO/VICODIN) 5-325 MG tablet Take 1 tablet by mouth every 4 (four) hours as needed. 04/26/18   Lily Kocher, PA-C  loratadine (CLARITIN) 10 MG tablet Take 5 mg by mouth 2 (two) times daily.    [provider]  losartan (COZAAR) 50 MG tablet Take 50 mg by mouth daily.     [provider]  omeprazole (PRILOSEC) 20 MG capsule Take 1 po BID x 2 weeks then once a day 03/24/18   Rolland Porter, MD  ondansetron (ZOFRAN ODT) 4 MG disintegrating tablet 4mg  ODT q4 hours prn nausea/vomit 03/21/18   Milton Ferguson, MD  ondansetron (ZOFRAN) 4 MG tablet Take 1 tablet (4 mg total) by mouth every 6 (six) hours. 04/26/18   Lily Kocher, PA-C  pantoprazole (PROTONIX) 20 MG tablet Take 1 tablet (20 mg total) by mouth daily. 03/21/18   Milton Ferguson, MD  potassium chloride SA (K-DUR,KLOR-CON) 20 MEQ tablet Take 1 tablet (20 mEq total) by mouth 2 (two) times daily. 04/19/18   Evalee Jefferson, PA-C  promethazine-codeine (PHENERGAN WITH CODEINE) 6.25-10 MG/5ML syrup Take 5 mLs by mouth every 4 (four) hours as needed for cough. 04/19/18   Evalee Jefferson, PA-C  traZODone (DESYREL) 50 MG tablet Take 1 tablet (50 mg total) by mouth at bedtime. 12/28/17   Suella Broad, FNP    Family History Family History  Problem Relation Age of Onset  . Gout Paternal Grandfather   . Cirrhosis Paternal  Grandfather   . Hypertension Paternal Grandmother   . Aneurysm Paternal Grandmother   . Cirrhosis Maternal Grandmother   . Cirrhosis Maternal Grandfather   . Cancer Father   . Cirrhosis Father   . Cirrhosis Mother   . Breast cancer Sister   . Hypertension Sister   . Bronchitis Daughter   . Bronchitis Daughter   . Asthma Son   . Bronchitis Son   . Migraines Neg Hx     Social History Social History   Tobacco Use  . Smoking status: Former Smoker    Types:  Cigarettes  . Smokeless tobacco: Never Used  Substance Use Topics  . Alcohol use: Yes    Comment: occ  . Drug use: Not Currently    Types: Cocaine    Comment: crack     Allergies   Clonopin [clonazepam]; Fish allergy; Flexeril [cyclobenzaprine hcl]; Ibuprofen; Ace inhibitors; and Tramadol   Review of Systems Review of Systems  Constitutional: Negative for activity change.       All ROS Neg except as noted in HPI  HENT: Positive for congestion. Negative for nosebleeds.   Eyes: Negative for photophobia and discharge.  Respiratory: Positive for cough. Negative for shortness of breath and wheezing.        Chest wall pain  Cardiovascular: Negative for chest pain and palpitations.  Gastrointestinal: Negative for abdominal pain and blood in stool.  Genitourinary: Negative for dysuria, frequency and hematuria.  Musculoskeletal: Positive for back pain and myalgias. Negative for arthralgias and neck pain.  Skin: Negative.   Neurological: Negative for dizziness, seizures and speech difficulty.  Psychiatric/Behavioral: Negative for confusion and hallucinations.     Physical Exam Updated Vital Signs BP 121/88 (BP Location: Right Arm)   Pulse 98   Temp 98.2 F (36.8 C) (Oral)   Resp 18   Ht 4\' 11"  (1.499 m)   Wt 85 kg   LMP 04/19/2018   SpO2 98%   BMI 37.85 kg/m   Physical Exam Vitals signs and nursing note reviewed.  Constitutional:      Appearance: She is well-developed. She is not toxic-appearing.  HENT:      Head: Normocephalic.     Right Ear: Tympanic membrane and external ear normal.     Left Ear: Tympanic membrane and external ear normal.  Eyes:     General: Lids are normal.     Pupils: Pupils are equal, round, and reactive to light.  Neck:     Musculoskeletal: Normal range of motion and neck supple.     Vascular: No carotid bruit.  Cardiovascular:     Rate and Rhythm: Normal rate and regular rhythm.     Pulses: Normal pulses.     Heart sounds: Normal heart sounds.  Pulmonary:     Effort: No accessory muscle usage, respiratory distress or retractions.     Breath sounds: Wheezing and rhonchi present.     Comments: Increased rhonchi noted bilaterally.  A few end expiratory wheezes appreciated.  There is symmetrical rise and fall of the chest.  The patient speaks in complete sentences without problem. Chest:     Chest wall: Tenderness present. No crepitus.    Abdominal:     General: Bowel sounds are normal.     Palpations: Abdomen is soft.     Tenderness: There is no abdominal tenderness. There is no guarding.  Musculoskeletal: Normal range of motion.        General: No tenderness.     Right lower leg: No edema.     Left lower leg: No edema.     Comments: Neg Homan's sign.  Capillary refill is less than 2 seconds.  There is no edema of the upper or lower extremities.  Lymphadenopathy:     Head:     Right side of head: No submandibular adenopathy.     Left side of head: No submandibular adenopathy.     Cervical: No cervical adenopathy.  Skin:    General: Skin is warm and dry.  Neurological:     Mental Status: She is alert and oriented to  person, place, and time.     Cranial Nerves: No cranial nerve deficit.     Sensory: No sensory deficit.  Psychiatric:        Speech: Speech normal.      ED Treatments / Results  Labs (all labs ordered are listed, but only abnormal results are displayed) Labs Reviewed  COMPREHENSIVE METABOLIC PANEL  CBC WITH DIFFERENTIAL/PLATELET    BRAIN NATRIURETIC PEPTIDE  TROPONIN I    EKG None  Radiology No results found.  Procedures Procedures (including critical care time)  Medications Ordered in ED Medications  HYDROcodone-acetaminophen (NORCO/VICODIN) 5-325 MG per tablet 2 tablet (has no administration in time range)  ondansetron (ZOFRAN-ODT) disintegrating tablet 4 mg (has no administration in time range)  albuterol (PROVENTIL HFA;VENTOLIN HFA) 108 (90 Base) MCG/ACT inhaler 2 puff (2 puffs Inhalation Given 05/10/18 1048)  albuterol (PROVENTIL) (2.5 MG/3ML) 0.083% nebulizer solution 2.5 mg (2.5 mg Nebulization Given 05/10/18 1045)     Initial Impression / Assessment and Plan / ED Course  I have reviewed the triage vital signs and the nursing notes.  Pertinent labs & imaging results that were available during my care of the patient were reviewed by me and considered in my medical decision making (see chart for details).       Final Clinical Impressions(s) / ED Diagnoses MDM  Vital signs are within normal limits.  Pulse oximetry is 98% on room air.  Within normal limits by my interpretation.  I have reviewed the previous emergency department visits and labs.  Patient's pulse oximetry has been monitored closely throughout this visit.  It remains between 98 and 100.  The patient speaks in complete sentences without problem.  Patient breathing some easier after nebulizer treatment.  The comprehensive metabolic panel is well within normal limits.  The complete blood count shows no acute changes.  The white blood cell count is normal, the hemoglobin is 11.4, and the hematocrit is normal at 36.3.  Platelets are well elevated at 457. B natruretic peptide obtained to rule out cough and discomfort related to congestive failure, but this is normal at 14.  Electrocardiogram obtained as well as her troponin and both negative for acute cardiac event.  So doubt cardiac as a source of chest area pain or difficulty with breathing  or discomfort. Chest x-ray shows no acute cardiopulmonary findings.  A CT angios chest showed no evidence of pulmonary embolism.  And there is no acute intrathoracic process noted.  I have asked the patient to see the pulmonology specialist in this area as this is the third emergency department visit for cough and chest wall pain.  Patient is treated with albuterol inhaler, short course of steroid, and medication for cough.   Final diagnoses:  Bronchitis  Chest wall pain    ED Discharge Orders         Ordered    promethazine-dextromethorphan (PROMETHAZINE-DM) 6.25-15 MG/5ML syrup  4 times daily PRN     05/10/18 1318    dexamethasone (DECADRON) 4 MG tablet  2 times daily with meals     05/10/18 1318           Lily Kocher, PA-C 05/10/18 1318    Maudie Flakes, MD 05/10/18 1436

## 2018-05-10 NOTE — ED Notes (Signed)
RT called for breathing treatments.

## 2018-05-10 NOTE — ED Triage Notes (Signed)
Pt reports she has been coughing for 4 weeks and she felt like she had gotten better. Pt reports that she is sore in her chest, under her ribs and in her back when she moves. Cough remains persistent

## 2018-05-10 NOTE — Discharge Instructions (Signed)
Your lab work is well within normal limits.  The x-ray of your chest is negative for acute problems.  The CT scan of your chest shows no evidence of any blood clots, no significant structural abnormality of your lungs or chest area.  We will treat you for bronchitis and chest wall pain.  Please use albuterol 2 puffs every 4 hours.  Please use Decadron 2 times daily with food.  Use Tylenol extra strength for pain.  Use Promethazine DM for cough.  Please see Dr. Luan Pulling for pulmonary evaluation given that this is your third evaluation and work-up for this issue, I think it would be necessary for you to be evaluated by a pulmonary specialist.

## 2018-06-07 ENCOUNTER — Other Ambulatory Visit: Payer: Self-pay

## 2018-06-07 ENCOUNTER — Encounter (HOSPITAL_COMMUNITY): Payer: Self-pay

## 2018-06-07 ENCOUNTER — Emergency Department (HOSPITAL_COMMUNITY)
Admission: EM | Admit: 2018-06-07 | Discharge: 2018-06-07 | Disposition: A | Discharge status: Home / Self Care | Payer: Medicaid Other | Attending: Emergency Medicine | Admitting: Emergency Medicine

## 2018-06-07 DIAGNOSIS — Z79899 Other long term (current) drug therapy: Secondary | ICD-10-CM | POA: Insufficient documentation

## 2018-06-07 DIAGNOSIS — J45909 Unspecified asthma, uncomplicated: Secondary | ICD-10-CM | POA: Insufficient documentation

## 2018-06-07 DIAGNOSIS — M795 Residual foreign body in soft tissue: Secondary | ICD-10-CM | POA: Diagnosis not present

## 2018-06-07 DIAGNOSIS — Z87891 Personal history of nicotine dependence: Secondary | ICD-10-CM | POA: Diagnosis not present

## 2018-06-07 DIAGNOSIS — I10 Essential (primary) hypertension: Secondary | ICD-10-CM | POA: Diagnosis not present

## 2018-06-07 MED ORDER — LIDOCAINE HCL (PF) 2 % IJ SOLN
INTRAMUSCULAR | Status: AC
  Filled 2018-06-07: qty 30

## 2018-06-07 MED ORDER — LIDOCAINE HCL (PF) 2 % IJ SOLN
10.0000 mL | Freq: Once | INTRAMUSCULAR | Status: AC
  Administered 2018-06-07: 10 mL

## 2018-06-07 NOTE — Discharge Instructions (Addendum)
Keep the site clean and dry.  You may wash with soap and water twice daily and then apply a new bandage until the scab has formed over the site.
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
## 2018-06-20 ENCOUNTER — Encounter (HOSPITAL_COMMUNITY): Payer: Self-pay

## 2018-06-20 ENCOUNTER — Ambulatory Visit (HOSPITAL_COMMUNITY): Payer: Medicaid Other | Attending: Orthopedic Surgery

## 2018-06-20 ENCOUNTER — Other Ambulatory Visit: Payer: Self-pay

## 2018-06-20 DIAGNOSIS — M6281 Muscle weakness (generalized): Secondary | ICD-10-CM

## 2018-06-20 DIAGNOSIS — R2689 Other abnormalities of gait and mobility: Secondary | ICD-10-CM

## 2018-06-20 DIAGNOSIS — M25562 Pain in left knee: Secondary | ICD-10-CM

## 2018-06-20 NOTE — Therapy (Signed)
[place], [place], [address] Phone: [phone]   Fax:  [phone]  Physical Therapy Evaluation  Patient Details  Name: Kathryn [name] MRN: [id number] Date of Birth: 1971-04-06 Referring Provider (PT): Marchia Bond, MD   Encounter Date: 06/20/2018  PT End of Session - 06/20/18 1445    Visit Number  1    Number of Visits  12    Date for PT Re-Evaluation  08/01/18    Authorization Type  Medicaid [place] (requesting 3 units)    Authorization Time Period  06/20/2018 - 08/01/2018    Authorization - Visit Number  0    Authorization - Number of Visits  3    PT Start Time  5400    PT Stop Time  1444    PT Time Calculation (min)  40 min    Activity Tolerance  Patient limited by pain;Patient tolerated treatment well    Behavior During Therapy  [facility] for tasks assessed/performed       Past Medical History:  Diagnosis Date  . Asthma   . Chronic back pain   . Hypertension   . Neuropathy     Past Surgical History:  Procedure Laterality Date  . TUBAL LIGATION      There were no vitals filed for this visit.   Subjective Assessment - 06/20/18 1407    Subjective  [name] reports she has been experiencing knee increased knee pain of 10/10 since this Monday. She has a chronic history of bil knee pain but reports it is only her Lt knee currently. She has had injections in her knees and reports the last injection was on 06/13/18 in her Lt knee. She also had fluid drained off of her knee that same visit by Dr. Mardelle Matte. She states she has 10/10 pain today and that it would be a 20 if our scale went that high. After education on 10/10 = such severe pain that she would need to go to the ED she stated she believes she does need to go. Therapist offered for her to end the session and encouraged her to seek emergency care if she feels it is necessary but patient requested to continue with eval and stated she would just take her medicine  when she goes home. She reports she feels like the swellin ghas returned to her Lt knee and states she has numbness and tingling in her Lt foot. She also arrives wearing an adjustable lumbar spine brace.     How long can you stand comfortably?  2 minutes due to knee and back    How long can you walk comfortably?  about 5-10 feet    Currently in Pain?  Yes    Pain Score  10-Worst pain ever    Pain Location  Knee    Pain Orientation  Left    Pain Descriptors / Indicators  Numbness    Pain Type  Chronic pain;Acute pain    Pain Radiating Towards  reports numbness and tingling into Lt LE/foot    Pain Onset  In the past 7 days   acute on chroinc   Pain Frequency  Constant    Aggravating Factors   everything    Pain Relieving Factors  laying down with pillow under knees bring pain to about 8/10         [facility] PT Assessment - 06/20/18 0001      Assessment   Medical Diagnosis  Bil knee Pain  pt c/o Lt knee pain   Referring Provider (PT)  Marchia Bond, MD    Onset Date/Surgical Date  06/13/18   pt states it got worse on Monday 06/17/18   Prior Therapy  none      Precautions   Precautions  None      Restrictions   Weight Bearing Restrictions  No      Balance Screen   Has the patient fallen in the past 6 months  Yes    How many times?  3   all while ambulating   Has the patient had a decrease in activity level because of a fear of falling?   Yes    Is the patient reluctant to leave their home because of a fear of falling?   Yes      [place]  Private residence    Living Arrangements  Other relatives   2 grandkids, 23, 79 year old   Available Help at Discharge  Family   son and daughter check in on her   Type of [place] to enter    Entrance Stairs-Number of Steps  3    Entrance Stairs-Rails  None   need a [place]  One level    [place] - single point    Additional Comments  Patient is waiting on  prescription for RW from MD. She reports she has an aid/care attendant Mon-Fri for 8 hours each day who assists her with bathing, dressing, and meal preparation.      Prior Function   Level of Independence  Needs assistance with homemaking;Needs assistance with ADLs;Independent with community mobility with device;Independent with household mobility with device    Vocation  Unemployed;On disability    Comments  Patient has 2 grandchildren living with her. Her duaghter and son live close by and check in on her daily.       Cognition   Overall Cognitive Status  Within Functional Limits for tasks assessed      Observation/Other Assessments   Focus on Therapeutic Outcomes (FOTO)   NA    Other Surveys   Lower Extremity Functional Scale    Lower Extremity Functional Scale   0/80 = patient reports extreme dificulty/unable to perform on all tasks      Observation/Other Assessments-Edema    Edema  Circumferential      Circumferential Edema   Circumferential - Right  18.5 inches at joint line    Circumferential - Left   19.5 inches at joint line      ROM / Strength   AROM / PROM / Strength  AROM;Strength      AROM   AROM Assessment Site  Knee    Right/Left Knee  Right;Left    Right Knee Extension  0    Right Knee Flexion  120    Left Knee Extension  0    Left Knee Flexion  90      Strength   Strength Assessment Site  Knee;Hip;Ankle    Right Hip Flexion  4+/5    Right Hip Extension  4+/5    Right Hip ABduction  5/5    Left Hip Flexion  4-/5   extensor lag, pain   Left Hip Extension  4-/5   pain   Left Hip ABduction  3-/5   pain   Right/Left Knee  Right;Left    Right Knee Flexion  5/5  Right Knee Extension  5/5    Left Knee Flexion  3-/5   pain   Left Knee Extension  4-/5   pain   Right Ankle Dorsiflexion  5/5    Left Ankle Dorsiflexion  4/5      Ambulation/Gait   Ambulation/Gait  Yes    Ambulation/Gait Assistance  5: Supervision    Ambulation/Gait Assistance Details   supervision for safety to navigate    Ambulation Distance (Feet)  172 Feet   2MWT   Assistive device  Straight cane    Gait Pattern  Decreased arm swing - right;Decreased arm swing - left;Decreased stance time - left;Decreased hip/knee flexion - left;Decreased weight shift to left;Antalgic;Wide base of support    Ambulation Surface  Level;Indoor    Gait velocity  0.4 m/s       Objective measurements completed on examination: See above findings.    PT Education - 06/20/18 1626    Education Details  Educated on exam findigns and appropriate POC. Educated on insurance authorization process and that we will not be able to schedule a visit tomorrow but will be able to schedule starting next week to ensure approval.     Person(s) Educated  Patient    Methods  Explanation    Comprehension  Verbalized understanding       PT Short Term Goals - 06/20/18 1605      PT SHORT TERM GOAL #1   Title  Patient will be independent with HEP, updated PRN, to improve ROM and strength for Lt LE to improve functional mobility and QOL.    Time  3    Period  Weeks    Status  New    Target Date  07/11/18      PT SHORT TERM GOAL #2   Title  Patient will improve gait velocity to 0.6 m/s or greater during 2MWT with no rest breaks during test to deomnstrate improvement in endurance.    Time  3    Period  Weeks    Status  New      PT SHORT TERM GOAL #3   Title  Patient will improve LEFS score by 12 points to indicate significant reductions in limitation related to Lt kne pain for improved QOL and function.    Time  3    Period  Weeks    Status  New        PT Long Term Goals - 06/20/18 1609      PT LONG TERM GOAL #1   Title  Patient will improve LEFS score by 24 points to indicate significant reductions in limitation related to Lt kne pain for improved QOL and function.    Time  6    Period  Weeks    Status  New      PT LONG TERM GOAL #2   Title  Patient will improve gait velocity to 0.8 m/s or  greater during 2MWT with no rest breaks during test to demonstrate improvement in endurance.    Time  6    Period  Weeks    Status  New      PT LONG TERM GOAL #3   Title  Patient will achieve 0-110 degrees AROM on Lt knee to improve funcitonal gait and stair mobility to improve her access to eenter/exit her home.     Time  6    Period  Weeks    Status  New      PT LONG TERM GOAL #4  Title  Patient will report pain no greater than 7/10 for the last week to indicate significant reduction     Time  6    Period  Weeks    Status  New        Plan - 06/20/18 1552    Clinical Impression Statement  Ms. Mand presents to physical therapy for evaluation of Lt knee pain. She reports her Rt knee no longer bothers her but that she has Lt knee pain and swelling. She arrives wearing an adjustable lumbar brace and ambulating with SPC. She reports 10/10 pain at arrival and is tearful with strength testing during evaluation but this subsides. Ms. Sawtelle presents with edema around Lt knee, weakness with MMT, limited AROM of Lt knee from 0-90 degrees compared to 0-120 on Rt, impaired gait and balance, as well as a history of falling. Ms. Dalby will benefit from skilled PT interventions to address impairments and improve mobility/function to improve patients;'s QOL.    Personal Factors and Comorbidities  Education;Past/Current Experience;Fitness;Comorbidity 3+;Behavior Pattern;Other;Profession   history of substance abuse; unemployed applying for disability   Comorbidities  hypertension, major depressive disorder, bil knee osteoarthritis, chroinc pain, neuropathy,     Examination-Activity Limitations  Bathing;Caring for Others;Dressing;Stairs;Bend;Stand;Locomotion Level    Examination-Participation Restrictions  Medication Management;Cleaning;Meal Prep;Laundry    Stability/Clinical Decision Making  Evolving/Moderate complexity    Clinical Decision Making  Moderate    Rehab Potential  Fair    PT Frequency  2x  / week    PT Duration  6 weeks    PT Treatment/Interventions  ADLs/Self Care Home Management;Aquatic Therapy;Cryotherapy;Electrical Stimulation;Iontophoresis 4mg /ml Dexamethasone;Moist Heat;DME Instruction;Gait training;Stair training;Functional mobility training;Therapeutic activities;Therapeutic exercise;Balance training;Neuromuscular re-education;Patient/family education;Manual techniques;Passive range of motion;Splinting;Joint Manipulations    PT Next Visit Plan  Review eval and goals. Perform 5x sit to stand and stair mobility. Patient would benefit from PNE. Perfomr light manual for retrograde massage to address edema at knee. Perform light isometric strengthening initially.    PT Home Exercise Plan  provide next visit    Consulted and Agree with Plan of Care  Patient       Patient will benefit from skilled therapeutic intervention in order to improve the following deficits and impairments:  Abnormal gait, Decreased activity tolerance, Decreased endurance, Decreased range of motion, Decreased strength, Pain, Improper body mechanics, Decreased balance, Decreased mobility, Difficulty walking, Increased edema, Impaired flexibility, Postural dysfunction, Obesity, Hypomobility, Impaired perceived functional ability  Visit Diagnosis: Left knee pain, unspecified chronicity  Other abnormalities of gait and mobility  Muscle weakness (generalized)     Problem List Patient Active Problem List   Diagnosis Date Noted  . Enlarged uterus 02/20/2018  . Screening for colorectal cancer 02/20/2018  . Routine cervical smear 02/20/2018  . Encounter for gynecological examination with Papanicolaou smear of cervix 02/20/2018  . Osteoarthritis of left knee 12/27/2017  . MDD (major depressive disorder), recurrent episode, severe ([place]) 12/24/2017  . Lumbar back pain 05/11/2017  . Depression, major, single episode, moderate ([place]) 05/11/2017  . Low vitamin B12 level 05/11/2017  . Immunization due  05/11/2017  . Hypertension goal BP (blood pressure) < 140/90 01/11/2017    Kipp Brood, PT, DPT, [facility] Physical Therapist with [facility]  06/20/2018 4:39 PM    [place], [place], [address] Phone: [phone]   Fax:  [phone]  Name: LESLEIGH HUGHSON MRN: [id number] Date of Birth: 11/03/1970

## 2018-06-21 ENCOUNTER — Ambulatory Visit (HOSPITAL_COMMUNITY): Payer: Medicaid Other

## 2018-06-24 ENCOUNTER — Telehealth (HOSPITAL_COMMUNITY): Payer: Self-pay | Admitting: Internal Medicine

## 2018-06-24 ENCOUNTER — Ambulatory Visit (HOSPITAL_COMMUNITY): Payer: Medicaid Other | Admitting: Physical Therapy

## 2018-06-24 ENCOUNTER — Telehealth (HOSPITAL_COMMUNITY): Payer: Self-pay | Admitting: Physical Therapy

## 2018-06-24 NOTE — Telephone Encounter (Signed)
06/24/18  Pt called at 12:36pm and said she didn't realize her appt was 8:15 this morning.  She said that her leg was really bothering her.

## 2018-06-24 NOTE — Telephone Encounter (Signed)
Pt did not show for appointment (NS #1).  Called and left message regarding this and reminder for next appointment.  Teena Irani, PTA/CLT [phone]

## 2018-06-26 ENCOUNTER — Other Ambulatory Visit: Payer: Self-pay

## 2018-06-26 ENCOUNTER — Encounter (HOSPITAL_COMMUNITY): Payer: Self-pay

## 2018-06-26 ENCOUNTER — Ambulatory Visit (HOSPITAL_COMMUNITY): Payer: Medicaid Other

## 2018-06-26 DIAGNOSIS — M25562 Pain in left knee: Secondary | ICD-10-CM

## 2018-06-26 DIAGNOSIS — R2689 Other abnormalities of gait and mobility: Secondary | ICD-10-CM

## 2018-06-26 DIAGNOSIS — M6281 Muscle weakness (generalized): Secondary | ICD-10-CM

## 2018-06-26 NOTE — Patient Instructions (Addendum)
Quad Set    Slowly tighten muscles on thigh of straight leg while counting out loud to 5".  Repeat 10 times. Do 1-2 sessions per day.  http://gt2.exer.us/362   Copyright  VHI. All rights reserved.   Heel Slide - Beginner    Laying on back with both knees bent.  Slowly bend knee to a comfortable position and press heel into mat/bed and hold for 5".  Repeat 10x and hold for 5".  Complete 1-2 times a day.   Copyright  VHI. All rights reserved.   RICE Rest Ice Compression Elevation

## 2018-06-26 NOTE — Therapy (Signed)
[place], [place], [address] Phone: [phone]   Fax:  [phone]  Physical Therapy Treatment  Patient Details  Name: Kathryn [name] MRN: [id number] Date of Birth: 01/11/1971 Referring Provider (PT): Marchia Bond, MD   Encounter Date: 06/26/2018  PT End of Session - 06/26/18 1307    Visit Number  2    Number of Visits  12    Date for PT Re-Evaluation  08/01/18   [place] 07/11/18   Authorization Type  Medicaid [place] approved authorization from 3/6-->07/04/18    Authorization Time Period  cert: 10/17/2200 - 5/42/7062    Authorization - Visit Number  1    Authorization - Number of Visits  3    PT Start Time  1300    PT Stop Time  3762    PT Time Calculation (min)  43 min    Activity Tolerance  Patient limited by pain;Patient tolerated treatment well;No increased pain   Reduced to 7/10 at EOS, was 10/10 at beginning of session   Behavior During Therapy  [facility] for tasks assessed/performed       Past Medical History:  Diagnosis Date  . Asthma   . Chronic back pain   . Hypertension   . Neuropathy     Past Surgical History:  Procedure Laterality Date  . TUBAL LIGATION      There were no vitals filed for this visit.  Subjective Assessment - 06/26/18 1255    Subjective  Pt stated her Lt knee is very painful today, pain scale 10/10 sharp achey pain.  When asked if needs to go to ED she declined stating she feels able to complete therapy today.  Reports swelling continues.    Currently in Pain?  Yes    Pain Score  10-Worst pain ever    Pain Location  Knee    Pain Orientation  Left    Pain Descriptors / Indicators  Sharp;Aching    Pain Type  Chronic pain;Acute pain    Pain Onset  More than a month ago    Pain Frequency  Constant    Aggravating Factors   everything    Pain Relieving Factors  laying down wiht pillow under knees, reduced pain to about 8/10.           [facility] PT Assessment - 06/26/18 0001       Assessment   Medical Diagnosis  Bil knee Pain    Referring Provider (PT)  Marchia Bond, MD    Onset Date/Surgical Date  06/13/18   pt. stated it got worse on 06/17/18   Prior Therapy  none      Ambulation/Gait   Stairs  Yes    Stairs Assistance  5: Supervision    Stairs Assistance Details (indicate cue type and reason)  Step to pattern leading with Rt LE    Stair Management Technique  One rail Left;With cane    Number of Stairs  4    Height of Stairs  7      Standardized Balance Assessment   Standardized Balance Assessment  Five Times Sit to Stand    Five times sit to stand comments   17.86" no HHA                   [facility] Adult PT Treatment/Exercise - 06/26/18 0001      Exercises   Exercises  Knee/Hip      Knee/Hip Exercises: Standing   Stairs  2x step to pattern with 1HR and SPC Rt UE 7in step height      Knee/Hip Exercises: Seated   Sit to Sand  5 reps;without UE support   5STS 17.86"     Knee/Hip Exercises: Supine   Quad Sets  10 reps    Quad Sets Limitations  5" holds, encouraged to use towel at home for proprioception    Heel Slides  10 reps    Heel Slides Limitations  within pain free range    Other Supine Knee/Hip Exercises  isometric hamstring curls 10x 5" holds      Manual Therapy   Manual Therapy  Edema management    Manual therapy comments  Manual complete separate than rest of tx    Edema Management  Retrograde massage with LE elevated             PT Education - 06/26/18 1330    Education Details  Reviewed goals.  Established HEP and reviewed benefits of compliance wiht HEP to assist wiht ROM and strength for pain control.  Discussed PNE wiht verbalized understanding for pain and movements.  Educated on [place] for edema and pain control.     Person(s) Educated  Patient    Methods  Explanation;Demonstration;Handout    Comprehension  Verbalized understanding       PT Short Term Goals - 06/20/18 1605      PT SHORT TERM  GOAL #1   Title  Patient will be independent with HEP, updated PRN, to improve ROM and strength for Lt LE to improve functional mobility and QOL.    Time  3    Period  Weeks    Status  New    Target Date  07/11/18      PT SHORT TERM GOAL #2   Title  Patient will improve gait velocity to 0.6 m/s or greater during 2MWT with no rest breaks during test to deomnstrate improvement in endurance.    Time  3    Period  Weeks    Status  New      PT SHORT TERM GOAL #3   Title  Patient will improve LEFS score by 12 points to indicate significant reductions in limitation related to Lt kne pain for improved QOL and function.    Time  3    Period  Weeks    Status  New        PT Long Term Goals - 06/20/18 1609      PT LONG TERM GOAL #1   Title  Patient will improve LEFS score by 24 points to indicate significant reductions in limitation related to Lt kne pain for improved QOL and function.    Time  6    Period  Weeks    Status  New      PT LONG TERM GOAL #2   Title  Patient will improve gait velocity to 0.8 m/s or greater during 2MWT with no rest breaks during test to demonstrate improvement in endurance.    Time  6    Period  Weeks    Status  New      PT LONG TERM GOAL #3   Title  Patient will achieve 0-110 degrees AROM on Lt knee to improve funcitonal gait and stair mobility to improve her access to eenter/exit her home.     Time  6    Period  Weeks    Status  New      PT LONG TERM GOAL #  4   Title  Patient will report pain no greater than 7/10 for the last week to indicate significant reduction     Time  6    Period  Weeks    Status  New            Plan - 06/26/18 1505    Clinical Impression Statement  Began session reviewed goals.  Established HEP and educated purpose and benefits of compliance iwht HEP wiht verbalized understanding.  Discussed PNE to assist with pain control wiht therapy.  Educated on RICE techniques to assist with pain and edema wiht verbalized  understanding.  Completed objective testing included 5STS and stair mechanics.  Pt arrived to therapy with antalgic gait mechanics and no AD, educated on benefits of use of SPC to improve tolerance with weight bearing and pain control with gait.  Stair training complete with SPC with reports of vast improvements with pain control with WB.  Therex focus on isometric strengthening for strengthening and ROM.  EOS with manual retrograde massage to assist iwht edema and pain control.  AROM improved 0-110 at EOS.  Reports pain reduced to 7/10 at EOS.    Personal Factors and Comorbidities  Education;Past/Current Experience;Fitness;Comorbidity 3+;Behavior Pattern;Other;Profession   history of substance abuse, unemployed applying for disability   Comorbidities  hypertension, major depressive disorder, bil knee osteoarthritis, chroinc pain, neuropathy,     Examination-Activity Limitations  Bathing;Caring for Others;Dressing;Stairs;Bend;Stand;Locomotion Level    Examination-Participation Restrictions  Medication Management;Cleaning;Meal Prep;Laundry    Stability/Clinical Decision Making  Evolving/Moderate complexity    Rehab Potential  Fair    PT Frequency  2x / week    PT Duration  6 weeks    PT Treatment/Interventions  ADLs/Self Care Home Management;Aquatic Therapy;Cryotherapy;Electrical Stimulation;Iontophoresis 4mg /ml Dexamethasone;Moist Heat;DME Instruction;Gait training;Stair training;Functional mobility training;Therapeutic activities;Therapeutic exercise;Balance training;Neuromuscular re-education;Patient/family education;Manual techniques;Passive range of motion;Splinting;Joint Manipulations    PT Next Visit Plan  Patient would benefit from PNE. Perfomr light manual for retrograde massage to address edema at knee. Perform light isometric strengthening initially.    PT Home Exercise Plan  06/26/18: quad sets, heel slides, isometric hamstring 5" holds       Patient will benefit from skilled therapeutic  intervention in order to improve the following deficits and impairments:  Abnormal gait, Decreased activity tolerance, Decreased endurance, Decreased range of motion, Decreased strength, Pain, Improper body mechanics, Decreased balance, Decreased mobility, Difficulty walking, Increased edema, Impaired flexibility, Postural dysfunction, Obesity, Hypomobility, Impaired perceived functional ability  Visit Diagnosis: Left knee pain, unspecified chronicity  Other abnormalities of gait and mobility  Muscle weakness (generalized)     Problem List Patient Active Problem List   Diagnosis Date Noted  . Enlarged uterus 02/20/2018  . Screening for colorectal cancer 02/20/2018  . Routine cervical smear 02/20/2018  . Encounter for gynecological examination with Papanicolaou smear of cervix 02/20/2018  . Osteoarthritis of left knee 12/27/2017  . MDD (major depressive disorder), recurrent episode, severe ([place]) 12/24/2017  . Lumbar back pain 05/11/2017  . Depression, major, single episode, moderate ([place]) 05/11/2017  . Low vitamin B12 level 05/11/2017  . Immunization due 05/11/2017  . Hypertension goal BP (blood pressure) < 140/90 01/11/2017   Ihor Austin, LPTA; [facility] [phone]  Aldona Lento 06/26/2018, 3:19 PM  [place], [place], [address] Phone: [phone]   Fax:  [phone]  Name: Kathryn [name] MRN: [id number] Date of Birth: 1970/09/13

## 2018-06-28 ENCOUNTER — Ambulatory Visit (HOSPITAL_COMMUNITY): Payer: Medicaid Other

## 2018-06-28 ENCOUNTER — Telehealth (HOSPITAL_COMMUNITY): Payer: Self-pay

## 2018-06-28 NOTE — Telephone Encounter (Signed)
No show, attempted to call phone with busy signal.  Unable to communicate or leave message concerning missed apt.    [address], [place]; [facility] [phone]

## 2018-07-03 ENCOUNTER — Other Ambulatory Visit: Payer: Self-pay

## 2018-07-03 ENCOUNTER — Ambulatory Visit (HOSPITAL_COMMUNITY): Payer: Medicaid Other

## 2018-07-03 ENCOUNTER — Encounter (HOSPITAL_COMMUNITY): Payer: Self-pay

## 2018-07-03 DIAGNOSIS — R2689 Other abnormalities of gait and mobility: Secondary | ICD-10-CM | POA: Diagnosis not present

## 2018-07-03 DIAGNOSIS — M25562 Pain in left knee: Secondary | ICD-10-CM | POA: Diagnosis not present

## 2018-07-03 DIAGNOSIS — M6281 Muscle weakness (generalized): Secondary | ICD-10-CM | POA: Diagnosis not present

## 2018-07-03 NOTE — Therapy (Signed)
[place], [place], [address] Phone: [phone]   Fax:  [phone]  Physical Therapy Treatment  Patient Details  Name: Kathryn [name] MRN: [id number] Date of Birth: 25-Jul-1970 Referring Provider (PT): Marchia Bond, MD   Encounter Date: 07/03/2018  PT End of Session - 07/03/18 1313    Visit Number  3    Number of Visits  12    Date for PT Re-Evaluation  08/01/18   Minireassess 07/11/18   Authorization Type  Medicaid [place] approved authorization from 3/6-->07/04/18    Authorization Time Period  cert: 08/19/6566 - 05/13/5168    Authorization - Visit Number  2    Authorization - Number of Visits  3    PT Start Time  0174    PT Stop Time  9449   last 3 minutes answering LEFS   PT Time Calculation (min)  44 min    Activity Tolerance  Patient limited by pain;Patient tolerated treatment well;No increased pain    Behavior During Therapy  WFL for tasks assessed/performed       Past Medical History:  Diagnosis Date  . Asthma   . Chronic back pain   . Hypertension   . Neuropathy     Past Surgical History:  Procedure Laterality Date  . TUBAL LIGATION      There were no vitals filed for this visit.  Subjective Assessment - 07/03/18 1309    Subjective  Pt stated she has some soreness Lt knee and hip, pain scale 8/10.  Goes to referral MD on Friday, 23rd.  Has been compliant with HEP daily.  Pt stated PCP wants referral for AD for pt. to purchase    Currently in Pain?  Yes    Pain Score  8     Pain Location  Knee    Pain Orientation  Left    Pain Descriptors / Indicators  Sore;Sharp    Pain Type  Chronic pain;Acute pain    Pain Radiating Towards  reports numbness and tingling into Lt LE/foot    Pain Onset  More than a month ago    Pain Frequency  Constant    Aggravating Factors   everything    Pain Relieving Factors  laying down with pillow under knees, reduced pain to about 8/10         [facility] PT Assessment  - 07/03/18 0001      Assessment   Medical Diagnosis  Bil knee Pain    Referring Provider (PT)  Marchia Bond, MD    Onset Date/Surgical Date  06/13/18   pt stated it got worse on 06/17/18   Prior Therapy  none      Observation/Other Assessments   Lower Extremity Functional Scale   5/80   0/80 = patient reports extreme dificulty/unable to perform o     AROM   Left Knee Extension  0    Left Knee Flexion  112      Ambulation/Gait   Ambulation/Gait  Yes    Ambulation/Gait Assistance  5: Supervision    Ambulation Distance (Feet)  206 Feet   was 172; 2MWT   Assistive device  Straight cane    Gait Pattern  Decreased arm swing - right;Decreased arm swing - left;Decreased stance time - left;Decreased hip/knee flexion - left;Decreased weight shift to left;Antalgic;Wide base of support    Ambulation Surface  Level;Indoor    Gait velocity  .52 m/s   was .4 m/s  [facility] Treatment/Exercise - 07/03/18 0001      Knee/Hip Exercises: Standing   Rocker Board  2 minutes    Rocker Board Limitations  lateral with HHA    Gait Training  2MWT 236ft with SPC      Knee/Hip Exercises: Supine   Quad Sets  10 reps    Quad Sets Limitations  5" holds    Heel Slides  10 reps    Heel Slides Limitations  within pain free range    Knee Flexion  Left;AROM      Manual Therapy   Manual Therapy  Edema management    Manual therapy comments  Manual complete separate than rest of tx    Edema Management  Retrograde massage with LE elevated             PT Education - 07/03/18 1347    Education Details  Educated benefits with AD for pain and gait mechanics, discussed places to purchase.  Educated on compression garment benefits iwht measurements taken and paperwork given for [employer] therapy.    Person(s) Educated  Patient    Methods  Explanation;Demonstration;Handout;Verbal cues    Comprehension  Verbalized understanding;Returned demonstration       PT  Short Term Goals - 07/03/18 1321      PT SHORT TERM GOAL #1   Title  Patient will be independent with HEP, updated PRN, to improve ROM and strength for Lt LE to improve functional mobility and QOL.    Baseline  3/18: Reports compliance with HEP daily    Status  On-going      PT SHORT TERM GOAL #2   Title  Patient will improve gait velocity to 0.6 m/s or greater during 2MWT with no rest breaks during test to deomnstrate improvement in endurance.    Baseline  07/03/18: 2MWT 221ft= .5m/s with SPC 2 point sequence    Status  On-going      PT SHORT TERM GOAL #3   Title  Patient will improve LEFS score by 12 points to indicate significant reductions in limitation related to Lt kne pain for improved QOL and function.    Baseline  07/03/18: LEFS: 5/80 was 0/80    Status  On-going        PT Long Term Goals - 07/03/18 1321      PT LONG TERM GOAL #1   Title  Patient will improve LEFS score by 24 points to indicate significant reductions in limitation related to Lt kne pain for improved QOL and function.    Status  On-going      PT LONG TERM GOAL #2   Title  Patient will improve gait velocity to 0.8 m/s or greater during 2MWT with no rest breaks during test to demonstrate improvement in endurance.    Status  On-going      PT LONG TERM GOAL #3   Title  Patient will achieve 0-110 degrees AROM on Lt knee to improve funcitonal gait and stair mobility to improve her access to eenter/exit her home.     Baseline  07/03/18: AROM 0-112 degrees    Status  Achieved      PT LONG TERM GOAL #4   Title  Patient will report pain no greater than 7/10 for the last week to indicate significant reduction     Status  On-going            Plan - 07/03/18 1522    Clinical Impression Statement  Reviewed compliance with  HEP.  Session focus on knee mobility, gait training with SPC and manual techniques for pain control.  Also reviewed goals for medicaid reauthorization as ended 07/04/18.  Pt able to recall HEP  and demonstrate appropriately.  Added rocker board to improve weight shifting with gait.  2MWT complete with AD with vast improvements with equal stride length and stance phase,  improved gait velocity as well.  EOS wiht manual retrograde massage for edema control.  Reviewed RICE techniques for pain and edema control.  Pt educated on compression hose for edema control, measurements taken and paperwork given for [employer].  Improved score to 5/80 with LEFS for self perceived functional ability.      Personal Factors and Comorbidities  Education;Past/Current Experience;Fitness;Comorbidity 3+;Behavior Pattern;Other;Profession    Comorbidities  hypertension, major depressive disorder, bil knee osteoarthritis, chroinc pain, neuropathy,    history of substance abuse; unemployed applying for disability   Examination-Activity Limitations  Bathing;Caring for Others;Dressing;Stairs;Bend;Stand;Locomotion Level    Examination-Participation Restrictions  Medication Management;Cleaning;Meal Prep;Laundry    Stability/Clinical Decision Making  Evolving/Moderate complexity    Rehab Potential  Fair    PT Frequency  2x / week    PT Duration  6 weeks    PT Treatment/Interventions  ADLs/Self Care Home Management;Aquatic Therapy;Cryotherapy;Electrical Stimulation;Iontophoresis 4mg /ml Dexamethasone;Moist Heat;DME Instruction;Gait training;Stair training;Functional mobility training;Therapeutic activities;Therapeutic exercise;Balance training;Neuromuscular re-education;Patient/family education;Manual techniques;Passive range of motion;Splinting;Joint Manipulations    PT Next Visit Plan  Patient would benefit from PNE. Perfomr light manual for retrograde massage to address edema at knee. Perform light isometric strengthening initially.    PT Home Exercise Plan  06/26/18: quad sets, heel slides, isometric hamstring 5" holds       Patient will benefit from skilled therapeutic intervention in order to improve the  following deficits and impairments:  Abnormal gait, Decreased activity tolerance, Decreased endurance, Decreased range of motion, Decreased strength, Pain, Improper body mechanics, Decreased balance, Decreased mobility, Difficulty walking, Increased edema, Impaired flexibility, Postural dysfunction, Obesity, Hypomobility, Impaired perceived functional ability  Visit Diagnosis: Left knee pain, unspecified chronicity  Other abnormalities of gait and mobility  Muscle weakness (generalized)     Problem List Patient Active Problem List   Diagnosis Date Noted  . Enlarged uterus 02/20/2018  . Screening for colorectal cancer 02/20/2018  . Routine cervical smear 02/20/2018  . Encounter for gynecological examination with Papanicolaou smear of cervix 02/20/2018  . Osteoarthritis of left knee 12/27/2017  . MDD (major depressive disorder), recurrent episode, severe ([place]) 12/24/2017  . Lumbar back pain 05/11/2017  . Depression, major, single episode, moderate ([place]) 05/11/2017  . Low vitamin B12 level 05/11/2017  . Immunization due 05/11/2017  . Hypertension goal BP (blood pressure) < 140/90 01/11/2017   Ihor Austin, LPTA; CBIS [phone]  Aldona Lento 07/03/2018, 3:37 PM  [place], [place], [address] Phone: [phone]   Fax:  [phone]  Name: Kathryn [name] MRN: [id number] Date of Birth: Oct 14, 1970

## 2018-07-04 ENCOUNTER — Other Ambulatory Visit: Payer: Self-pay

## 2018-07-04 ENCOUNTER — Encounter (HOSPITAL_COMMUNITY): Payer: Self-pay | Admitting: *Deleted

## 2018-07-04 ENCOUNTER — Emergency Department (HOSPITAL_COMMUNITY)
Admission: EM | Admit: 2018-07-04 | Discharge: 2018-07-04 | Disposition: A | Discharge status: Home / Self Care | Payer: Medicaid Other | Attending: Emergency Medicine | Admitting: Emergency Medicine

## 2018-07-04 ENCOUNTER — Emergency Department (HOSPITAL_COMMUNITY): Payer: Medicaid Other

## 2018-07-04 DIAGNOSIS — J45909 Unspecified asthma, uncomplicated: Secondary | ICD-10-CM | POA: Insufficient documentation

## 2018-07-04 DIAGNOSIS — Z87891 Personal history of nicotine dependence: Secondary | ICD-10-CM | POA: Diagnosis not present

## 2018-07-04 DIAGNOSIS — J111 Influenza due to unidentified influenza virus with other respiratory manifestations: Secondary | ICD-10-CM | POA: Diagnosis not present

## 2018-07-04 DIAGNOSIS — Z79899 Other long term (current) drug therapy: Secondary | ICD-10-CM | POA: Insufficient documentation

## 2018-07-04 DIAGNOSIS — I10 Essential (primary) hypertension: Secondary | ICD-10-CM | POA: Insufficient documentation

## 2018-07-04 DIAGNOSIS — R05 Cough: Secondary | ICD-10-CM | POA: Diagnosis present

## 2018-07-04 DIAGNOSIS — R6889 Other general symptoms and signs: Secondary | ICD-10-CM

## 2018-07-04 LAB — CBC WITH DIFFERENTIAL/PLATELET
Abs Immature Granulocytes: 0.02 10*3/uL (ref 0.00–0.07)
BASOS ABS: 0 10*3/uL (ref 0.0–0.1)
Basophils Relative: 0 %
EOS PCT: 1 %
Eosinophils Absolute: 0.1 10*3/uL (ref 0.0–0.5)
HCT: 35 % — ABNORMAL LOW (ref 36.0–46.0)
Hemoglobin: 11.6 g/dL — ABNORMAL LOW (ref 12.0–15.0)
Immature Granulocytes: 0 %
LYMPHS PCT: 29 %
Lymphs Abs: 2.5 10*3/uL (ref 0.7–4.0)
MCH: 30 pg (ref 26.0–34.0)
MCHC: 33.1 g/dL (ref 30.0–36.0)
MCV: 90.4 fL (ref 80.0–100.0)
Monocytes Absolute: 0.6 10*3/uL (ref 0.1–1.0)
Monocytes Relative: 7 %
Neutro Abs: 5.4 10*3/uL (ref 1.7–7.7)
Neutrophils Relative %: 63 %
Platelets: 517 10*3/uL — ABNORMAL HIGH (ref 150–400)
RBC: 3.87 MIL/uL (ref 3.87–5.11)
RDW: 13.6 % (ref 11.5–15.5)
WBC: 8.6 10*3/uL (ref 4.0–10.5)
nRBC: 0 % (ref 0.0–0.2)

## 2018-07-04 LAB — COMPREHENSIVE METABOLIC PANEL
ALT: 17 U/L (ref 0–44)
AST: 16 U/L (ref 15–41)
Albumin: 3.6 g/dL (ref 3.5–5.0)
Alkaline Phosphatase: 68 U/L (ref 38–126)
Anion gap: 8 (ref 5–15)
BUN: 10 mg/dL (ref 6–20)
CHLORIDE: 106 mmol/L (ref 98–111)
CO2: 22 mmol/L (ref 22–32)
Calcium: 8.5 mg/dL — ABNORMAL LOW (ref 8.9–10.3)
Creatinine, Ser: 0.71 mg/dL (ref 0.44–1.00)
GFR calc Af Amer: >60 mL/min (ref >60)
GFR calc non Af Amer: >60 mL/min (ref >60)
Glucose, Bld: 80 mg/dL (ref 70–99)
Potassium: 3.6 mmol/L (ref 3.5–5.1)
Sodium: 136 mmol/L (ref 135–145)
Total Bilirubin: 0.4 mg/dL (ref 0.3–1.2)
Total Protein: 7.1 g/dL (ref 6.5–8.1)

## 2018-07-04 LAB — INFLUENZA PANEL BY PCR (TYPE A & B)
Influenza A By PCR: NEGATIVE
Influenza B By PCR: NEGATIVE

## 2018-07-04 MED ORDER — ACETAMINOPHEN 500 MG PO TABS
1000.0000 mg | ORAL_TABLET | Freq: Once | ORAL | Status: AC
  Administered 2018-07-04: 1000 mg via ORAL

## 2018-07-04 MED ORDER — ONDANSETRON HCL 4 MG/2ML IJ SOLN
4.0000 mg | Freq: Once | INTRAMUSCULAR | Status: AC
  Administered 2018-07-04: 4 mg via INTRAVENOUS
  Filled 2018-07-04: qty 2

## 2018-07-04 MED ORDER — SODIUM CHLORIDE 0.9 % IV SOLN: INTRAVENOUS | Status: DC

## 2018-07-04 MED ORDER — SODIUM CHLORIDE 0.9 % IV BOLUS
1000.0000 mL | Freq: Once | INTRAVENOUS | Status: AC
  Administered 2018-07-04: 1000 mL via INTRAVENOUS

## 2018-07-04 MED ORDER — ACETAMINOPHEN 500 MG PO TABS
ORAL_TABLET | ORAL | Status: AC
  Filled 2018-07-04: qty 2

## 2018-07-04 NOTE — ED Triage Notes (Addendum)
Pt c/o generalized weakness and bodycaches, cold chills, productive cough, nausea that started this morning. Denies vomiting. Unknown fever.

## 2018-07-04 NOTE — Discharge Instructions (Addendum)
Influenza testing was negative.  Which means you are not completely ruled out for coronavirus.  But unfortunately do not meet criteria to have it tested at this point in time due to some shortage of the test kits.  However you need to go home and self quarantine work note provided to be out of work for 14 days.  Make an appointment to follow-up with your regular doctor.  Return for any new or worse symptoms.  Recommend taking Tylenol for any the fevers.  Prescription provided for Mucinex DM for the cough.  And Zofran provided for the nausea.     Person Under Monitoring Name: Kathryn [name]  Location: [address] [place] [address]   Infection Prevention Recommendations for Individuals Confirmed to have, or Being Evaluated for, 2019 Novel Coronavirus (COVID-19) Infection Who Receive Care at Home  Individuals who are confirmed to have, or are being evaluated for, COVID-19 should follow the prevention steps below until a healthcare provider or local or state health department says they can return to normal activities.  Stay home except to get medical care You should restrict activities outside your home, except for getting medical care. Do not go to work, school, or public areas, and do not use public transportation or taxis.  Call ahead before visiting your doctor Before your medical appointment, call the healthcare provider and tell them that you have, or are being evaluated for, COVID-19 infection. This will help the healthcare providers office take steps to keep other people from getting infected. Ask your healthcare provider to call the local or state health department.  Monitor your symptoms Seek prompt medical attention if your illness is worsening (e.g., difficulty breathing). Before going to your medical appointment, call the healthcare provider and tell them that you have, or are being evaluated for, COVID-19 infection. Ask your healthcare provider to call the local or  state health department.  Wear a facemask You should wear a facemask that covers your nose and mouth when you are in the same room with other people and when you visit a healthcare provider. People who live with or visit you should also wear a facemask while they are in the same room with you.  Separate yourself from other people in your home As much as possible, you should stay in a different room from other people in your home. Also, you should use a separate bathroom, if available.  Avoid sharing household items You should not share dishes, drinking glasses, cups, eating utensils, towels, bedding, or other items with other people in your home. After using these items, you should wash them thoroughly with soap and water.  Cover your coughs and sneezes Cover your mouth and nose with a tissue when you cough or sneeze, or you can cough or sneeze into your sleeve. Throw used tissues in a lined trash can, and immediately wash your hands with soap and water for at least 20 seconds or use an alcohol-based hand rub.  Wash your [facility] your hands often and thoroughly with soap and water for at least 20 seconds. You can use an alcohol-based hand sanitizer if soap and water are not available and if your hands are not visibly dirty. Avoid touching your eyes, nose, and mouth with unwashed hands.   Prevention Steps for Caregivers and Household Members of Individuals Confirmed to have, or Being Evaluated for, COVID-19 Infection Being Cared for in the Home  If you live with, or provide care at home for, a person confirmed  to have, or being evaluated for, COVID-19 infection please follow these guidelines to prevent infection:  Follow healthcare providers instructions Make sure that you understand and can help the patient follow any healthcare provider instructions for all care.  Provide for the patients basic needs You should help the patient with basic needs in the home and provide support  for getting groceries, prescriptions, and other personal needs.  Monitor the patients symptoms If they are getting sicker, call his or her medical provider and tell them that the patient has, or is being evaluated for, COVID-19 infection. This will help the healthcare providers office take steps to keep other people from getting infected. Ask the healthcare provider to call the local or state health department.  Limit the number of people who have contact with the patient If possible, have only one caregiver for the patient. Other household members should stay in another home or place of residence. If this is not possible, they should stay in another room, or be separated from the patient as much as possible. Use a separate bathroom, if available. Restrict visitors who do not have an essential need to be in the home.  Keep older adults, very young children, and other sick people away from the patient Keep older adults, very young children, and those who have compromised immune systems or chronic health conditions away from the patient. This includes people with chronic heart, lung, or kidney conditions, diabetes, and cancer.  Ensure good ventilation Make sure that shared spaces in the home have good air flow, such as from an air conditioner or an opened window, weather permitting.  Wash your hands often Wash your hands often and thoroughly with soap and water for at least 20 seconds. You can use an alcohol based hand sanitizer if soap and water are not available and if your hands are not visibly dirty. Avoid touching your eyes, nose, and mouth with unwashed hands. Use disposable paper towels to dry your hands. If not available, use dedicated cloth towels and replace them when they become wet.  Wear a facemask and gloves Wear a disposable facemask at all times in the room and gloves when you touch or have contact with the patients blood, body fluids, and/or secretions or excretions, such  as sweat, saliva, sputum, nasal mucus, vomit, urine, or feces.  Ensure the mask fits over your nose and mouth tightly, and do not touch it during use. Throw out disposable facemasks and gloves after using them. Do not reuse. Wash your hands immediately after removing your facemask and gloves. If your personal clothing becomes contaminated, carefully remove clothing and launder. Wash your hands after handling contaminated clothing. Place all used disposable facemasks, gloves, and other waste in a lined container before disposing them with other household waste. Remove gloves and wash your hands immediately after handling these items.  Do not share dishes, glasses, or other household items with the patient Avoid sharing household items. You should not share dishes, drinking glasses, cups, eating utensils, towels, bedding, or other items with a patient who is confirmed to have, or being evaluated for, COVID-19 infection. After the person uses these items, you should wash them thoroughly with soap and water.  Wash laundry thoroughly Immediately remove and wash clothes or bedding that have blood, body fluids, and/or secretions or excretions, such as sweat, saliva, sputum, nasal mucus, vomit, urine, or feces, on them. Wear gloves when handling laundry from the patient. Read and follow directions on labels of laundry or clothing items  and detergent. In general, wash and dry with the warmest temperatures recommended on the label.  Clean all areas the individual has used often Clean all touchable surfaces, such as counters, tabletops, doorknobs, bathroom fixtures, toilets, phones, keyboards, tablets, and bedside tables, every day. Also, clean any surfaces that may have blood, body fluids, and/or secretions or excretions on them. Wear gloves when cleaning surfaces the patient has come in contact with. Use a diluted bleach solution (e.g., dilute bleach with 1 part bleach and 10 parts water) or a household  disinfectant with a label that says EPA-registered for coronaviruses. To make a bleach solution at home, add 1 tablespoon of bleach to 1 quart (4 cups) of water. For a larger supply, add  cup of bleach to 1 gallon (16 cups) of water. Read labels of cleaning products and follow recommendations provided on product labels. Labels contain instructions for safe and effective use of the cleaning product including precautions you should take when applying the product, such as wearing gloves or eye protection and making sure you have good ventilation during use of the product. Remove gloves and wash hands immediately after cleaning.  Monitor yourself for signs and symptoms of illness Caregivers and household members are considered close contacts, should monitor their health, and will be asked to limit movement outside of the home to the extent possible. Follow the monitoring steps for close contacts listed on the symptom monitoring form.   ? If you have additional questions, contact your local health department or call the epidemiologist on call at [phone] (available 24/7). ? This guidance is subject to change. For the most up-to-date guidance from [facility], please refer to their website: YouBlogs.pl      Person Under Monitoring Name: Kathryn [name]  Location: [address] [place] [address]   CORONAVIRUS DISEASE 2019 (COVID-19) Guidance for Persons Under Investigation You are being tested for the virus that causes coronavirus disease 2019 (COVID-19). Public health actions are necessary to ensure protection of your health and the health of others, and to prevent further spread of infection. COVID-19 is caused by a virus that can cause symptoms, such as fever, cough, and shortness of breath. The primary transmission from person to person is by coughing or sneezing. On May 16, 2018, the [place] announced a  [employer] Emergency of International Concern and on May 17, 2018 the U.S. Department of Health and Human Services declared a public health emergency. If the virus that causesCOVID-19 spreads in the community, it could have severe public health consequences.  As a person under investigation for COVID-19, the [place] advises you to adhere to the following guidance until your test results are reported to you. If your test result is positive, you will receive additional information from your provider and your local health department at that time.  Remain at home until you are cleared by your health provider or public health authorities.  Keep a log of visitors to your home using the form provided. Any visitors to your home must be aware of your isolation status. If you plan to move to a new address or leave the county, notify the local health department in your county. Call a doctor or seek care if you have an urgent medical need. Before seeking medical care, call ahead and get instructions from the provider before arriving at the medical office, clinic or hospital. Notify them that you are being tested for the  virus that causes COVID-19 so arrangements can be made, as necessary, to prevent transmission to others in the healthcare setting. Next, notify the local health department in your county. If a medical emergency arises and you need to call 911, inform the first responders that you are being tested for the virus that causes COVID-19. Next, notify the local health department in your county. Adhere to all guidance set forth by the [facility] for [facility] of patients that is based on guidance from the Center for Disease Control and Prevention with suspected or confirmed COVID-19. It is provided with this guidance for Persons Under Investigation.  Your health and the health of our community are  our top priorities. Public Health officials remain available to provide assistance and counseling to you about COVID-19 and compliance with this guidance.  Provider: ____________________________________________________________ Date: ______/_____/_________  By signing below, you acknowledge that you have read and agree to comply with this Guidance for Persons Under Investigation. ______________________________________________________________ Date: ______/_____/_________  WHO DO I CALL? You can find a list of local health departments here: [email or web address].[email or web address]/ Health Department: ____________________________________________________________________ Contact Name: ________________________________________________________________________ Telephone: ___________________________________________________________________________  Marice Potter, [place], Communicable Disease Branch COVID-19 Guidance for Persons Under Investigation June 22, 2018

## 2018-07-04 NOTE — ED Notes (Signed)
ED Provider at bedside. 

## 2018-07-04 NOTE — ED Provider Notes (Signed)
[facility] EMERGENCY DEPARTMENT Provider Note   CSN: [id number] Arrival date & time: 07/04/18  1119    History   Chief Complaint Chief Complaint  Patient presents with  . Weakness    HPI Kathryn [name] is a 48 y.o. female.     Patient with acute onset of generalized weakness body aches cold chills productive cough and nausea that started this morning.  No known fever.  No vomiting or diarrhea.  Patient did not have the flu vaccine.  Patient without any known sick contacts to include any coronavirus contacts no travel outside the state and no international travel.     Past Medical History:  Diagnosis Date  . Asthma   . Chronic back pain   . Hypertension   . Neuropathy     Patient Active Problem List   Diagnosis Date Noted  . Enlarged uterus 02/20/2018  . Screening for colorectal cancer 02/20/2018  . Routine cervical smear 02/20/2018  . Encounter for gynecological examination with Papanicolaou smear of cervix 02/20/2018  . Osteoarthritis of left knee 12/27/2017  . MDD (major depressive disorder), recurrent episode, severe ([place]) 12/24/2017  . Lumbar back pain 05/11/2017  . Depression, major, single episode, moderate ([place]) 05/11/2017  . Low vitamin B12 level 05/11/2017  . Immunization due 05/11/2017  . Hypertension goal BP (blood pressure) < 140/90 01/11/2017    Past Surgical History:  Procedure Laterality Date  . TUBAL LIGATION       OB History    Gravida  5   Para  3   Term  2   Preterm  1   AB  2   Living  3     SAB  1   TAB  1   Ectopic      Multiple      Live Births               Home Medications    Prior to Admission medications   Medication Sig Start Date End Date Taking? Authorizing Provider  albuterol (PROVENTIL HFA;VENTOLIN HFA) 108 (90 Base) MCG/ACT inhaler Inhale 2 puffs into the lungs 2 (two) times daily.   Yes [provider]  albuterol (PROVENTIL) (2.5 MG/3ML) 0.083% nebulizer solution Take 2.5 mg by  nebulization every 6 (six) hours as needed for wheezing or shortness of breath.   Yes [provider]  cholecalciferol (VITAMIN D3) 25 MCG (1000 UT) tablet Take 2,000 Units by mouth daily.   Yes [provider]  dexamethasone (DECADRON) 4 MG tablet Take 1 tablet (4 mg total) by mouth 2 (two) times daily with a meal. 05/10/18  Yes Lily Kocher, PA-C  DULoxetine (CYMBALTA) 60 MG capsule Take 1 capsule (60 mg total) by mouth daily. 12/29/17  Yes Starkes-Perry, Gayland Curry, FNP  fluticasone (VERAMYST) 27.5 MCG/SPRAY nasal spray Place 2 sprays into the nose daily.   Yes [provider]  gabapentin (NEURONTIN) 300 MG capsule Take 1 capsule (300 mg total) by mouth 3 (three) times daily. 12/28/17  Yes Starkes-Perry, Gayland Curry, FNP  hydrochlorothiazide (MICROZIDE) 12.5 MG capsule Take 12.5 mg by mouth daily.   Yes [provider]  loratadine (CLARITIN) 10 MG tablet Take 5 mg by mouth 2 (two) times daily.   Yes [provider]  losartan (COZAAR) 50 MG tablet Take 50 mg by mouth daily.    Yes [provider]  potassium chloride SA (K-DUR,KLOR-CON) 20 MEQ tablet Take 1 tablet (20 mEq total) by mouth 2 (two) times daily. 04/19/18  Yes Idol, Almyra Free, PA-C  traZODone (DESYREL) 50 MG tablet Take 1 tablet (50 mg total) by mouth at bedtime. 12/28/17  Yes Starkes-Perry, Gayland Curry, FNP  azithromycin (ZITHROMAX) 250 MG tablet Take 1 tablet (250 mg total) by mouth daily. Take first 2 tablets together, then 1 every day until finished. Patient not taking: Reported on 06/20/2018 04/26/18   Lily Kocher, PA-C  benzonatate (TESSALON) 100 MG capsule Take 2 capsules (200 mg total) by mouth 3 (three) times daily as needed. Patient not taking: Reported on 07/04/2018 04/19/18   Evalee Jefferson, PA-C  cephALEXin (KEFLEX) 500 MG capsule Take 1 capsule (500 mg total) by mouth 4 (four) times daily. Patient not taking: Reported on 07/04/2018 04/19/18   Triplett, Tammy, PA-C  famotidine (PEPCID) 20 MG tablet  Take 1 tablet (20 mg total) by mouth 2 (two) times daily. Patient not taking: Reported on 06/20/2018 02/06/18   Virgel Manifold, MD  HYDROcodone-acetaminophen (NORCO/VICODIN) 5-325 MG tablet Take 1 tablet by mouth every 4 (four) hours as needed. Patient not taking: Reported on 06/20/2018 04/26/18   Lily Kocher, PA-C  omeprazole (PRILOSEC) 20 MG capsule Take 1 po BID x 2 weeks then once a day Patient not taking: Reported on 06/20/2018 03/24/18   Rolland Porter, MD  ondansetron (ZOFRAN ODT) 4 MG disintegrating tablet 4mg  ODT q4 hours prn nausea/vomit Patient not taking: Reported on 06/20/2018 03/21/18   Milton Ferguson, MD  ondansetron (ZOFRAN) 4 MG tablet Take 1 tablet (4 mg total) by mouth every 6 (six) hours. Patient not taking: Reported on 07/04/2018 04/26/18   Lily Kocher, PA-C  pantoprazole (PROTONIX) 20 MG tablet Take 1 tablet (20 mg total) by mouth daily. Patient not taking: Reported on 06/20/2018 03/21/18   Milton Ferguson, MD  promethazine-codeine [facility] WITH CODEINE) 6.25-10 MG/5ML syrup Take 5 mLs by mouth every 4 (four) hours as needed for cough. Patient not taking: Reported on 06/20/2018 04/19/18   Evalee Jefferson, PA-C  promethazine-dextromethorphan (PROMETHAZINE-DM) 6.25-15 MG/5ML syrup Take 5 mLs by mouth 4 (four) times daily as needed. Patient not taking: Reported on 07/04/2018 05/10/18   Lily Kocher, PA-C    Family History Family History  Problem Relation Age of Onset  . Gout Paternal Grandfather   . Cirrhosis Paternal Grandfather   . Hypertension Paternal Grandmother   . Aneurysm Paternal Grandmother   . Cirrhosis Maternal Grandmother   . Cirrhosis Maternal Grandfather   . Cancer Father   . Cirrhosis Father   . Cirrhosis Mother   . Breast cancer Sister   . Hypertension Sister   . Bronchitis Daughter   . Bronchitis Daughter   . Asthma Son   . Bronchitis Son   . Migraines Neg Hx     Social History Social History   Tobacco Use  . Smoking status: Former Smoker    Types:  Cigarettes  . Smokeless tobacco: Never Used  Substance Use Topics  . Alcohol use: Yes    Comment: occ  . Drug use: Not Currently    Types: Cocaine    Comment: crack     Allergies   Clonopin [clonazepam]; Fish allergy; Flexeril [cyclobenzaprine hcl]; Ibuprofen; Ace inhibitors; and Tramadol   Review of Systems Review of Systems  Constitutional: Positive for chills and fatigue.  HENT: Positive for congestion.   Eyes: Negative for redness.  Respiratory: Positive for cough.   Cardiovascular: Negative for chest pain.  Gastrointestinal: Negative for abdominal pain.  Genitourinary: Negative for dysuria.  Musculoskeletal: Positive for myalgias.  Skin: Negative for rash.  Neurological: Negative for dizziness, syncope, weakness and headaches.  Hematological: Does not bruise/bleed easily.     Physical Exam Updated Vital Signs BP (!) 124/59   Pulse 76   Temp 98.2 F (36.8 C) (Oral)   Resp 19   Ht 1.499 m (4\' 11" )   Wt 91.6 kg   LMP 07/04/2018   SpO2 96%   BMI 40.80 kg/m   Physical Exam Vitals signs and nursing note reviewed.  Constitutional:      General: She is not in acute distress.    Appearance: Normal appearance. She is well-developed. She is ill-appearing.  HENT:     Head: Normocephalic and atraumatic.     Nose: Congestion present.     Mouth/Throat:     Mouth: Mucous membranes are moist.  Eyes:     Conjunctiva/sclera: Conjunctivae normal.  Neck:     Musculoskeletal: Normal range of motion and neck supple.  Cardiovascular:     Rate and Rhythm: Normal rate and regular rhythm.     Heart sounds: No murmur.  Pulmonary:     Effort: Pulmonary effort is normal. No respiratory distress.     Breath sounds: Normal breath sounds. No wheezing or rales.  Abdominal:     Palpations: Abdomen is soft.     Tenderness: There is no abdominal tenderness.  Skin:    General: Skin is warm and dry.     Capillary Refill: Capillary refill takes less than 2 seconds.  Neurological:      General: No focal deficit present.     Mental Status: She is alert and oriented to person, place, and time.      ED Treatments / Results  Labs (all labs ordered are listed, but only abnormal results are displayed) Labs Reviewed  COMPREHENSIVE METABOLIC PANEL - Abnormal; Notable for the following components:      Result Value   Calcium 8.5 (*)    All other components within normal limits  CBC WITH DIFFERENTIAL/PLATELET - Abnormal; Notable for the following components:   Hemoglobin 11.6 (*)    HCT 35.0 (*)    Platelets 517 (*)    All other components within normal limits  INFLUENZA PANEL BY PCR (TYPE A & B)    EKG EKG Interpretation  Date/Time:  Thursday July 04 2018 11:51:33 EDT Ventricular Rate:  87 PR Interval:    QRS Duration: 84 QT Interval:  372 QTC Calculation: 448 R Axis:   86 Text Interpretation:  Sinus rhythm Confirmed by Fredia Sorrow [phone]) on 07/04/2018 2:17:16 PM   Radiology Dg Chest 2 View  Result Date: 07/04/2018 CLINICAL DATA:  Cough and congestion with fever EXAM: CHEST - 2 VIEW COMPARISON:  Chest radiograph and chest CT May 10, 2018. FINDINGS: Lungs are clear. The heart size and pulmonary vascularity are normal. No adenopathy. There is degenerative change in the left acromioclavicular joint region, stable. IMPRESSION: No edema or consolidation. Electronically Signed   By: Lowella Grip III M.D.   On: 07/04/2018 14:04    Procedures Procedures (including critical care time)  Medications Ordered in ED Medications  0.9 %  sodium chloride infusion ( Intravenous Restarted 07/04/18 1722)  sodium chloride 0.9 % bolus 1,000 mL (0 mLs Intravenous Stopped 07/04/18 1626)  ondansetron (ZOFRAN) injection 4 mg (4 mg Intravenous Given 07/04/18 1421)  acetaminophen (TYLENOL) tablet 1,000 mg (1,000 mg Oral Given 07/04/18 1703)     Initial Impression / Assessment and Plan / ED Course  I have reviewed the triage vital signs and the  nursing notes.   Pertinent labs & imaging results that were available during my care of the patient were reviewed by me and considered in my medical decision making (see chart for details).    Patient's work-up chest x-ray negative for pneumonia influenza testing was negative.  Patient without any coronavirus contacts or high risk areas.  However with the influenza being negative coronavirus is a possibility.  Patient will be treated as if this could be what is going on she will self quarantine.  She will return for any new or worse symptoms.  Work note provided to be out of work for 2 weeks.  Patient will be treated symptomatically.      Final Clinical Impressions(s) / ED Diagnoses   Final diagnoses:  Flu-like symptoms    ED Discharge Orders    None       Fredia Sorrow, MD 07/04/18 2216

## 2018-07-05 ENCOUNTER — Telehealth (HOSPITAL_COMMUNITY): Payer: Self-pay

## 2018-07-05 ENCOUNTER — Ambulatory Visit (HOSPITAL_COMMUNITY): Payer: Medicaid Other

## 2018-07-05 NOTE — Telephone Encounter (Signed)
Called and spoke with pt regarding OPPT closing for 2 weeks to reduce spread of COVID-19.  Additional apt scheduled once dept opens again.  Pt stated she currently has the flu, encouraged to call and cancel apt if symptoms still present.    [address] [place]; [facility] [phone]

## 2018-07-10 ENCOUNTER — Ambulatory Visit (HOSPITAL_COMMUNITY): Payer: Medicaid Other

## 2018-07-11 ENCOUNTER — Telehealth (HOSPITAL_COMMUNITY): Payer: Self-pay | Admitting: Physical Therapy

## 2018-07-11 NOTE — Telephone Encounter (Signed)
Therapist called to check in on patient however there was no answer and was unable to leave voicemail.  Clarene Critchley PT, DPT 12:59 PM, 07/11/18 [phone]

## 2018-07-12 ENCOUNTER — Ambulatory Visit (HOSPITAL_COMMUNITY): Payer: Medicaid Other | Admitting: Physical Therapy

## 2018-07-17 ENCOUNTER — Telehealth (HOSPITAL_COMMUNITY): Payer: Self-pay

## 2018-07-17 NOTE — Telephone Encounter (Signed)
Called and spoke to pt regarding our clinic cancelling all appointments until further notice due to COVID-19 in order to ensure pt and staff safety. Pt not interested in telehealth sessions due to not having a smartphone/device with video capability but does want a weekly phone call to check-in on her HEP, etc. Pt did not desire any new exercises at this time. Will follow up with her weekly to answer any questions or concerns regarding progress/HEP.  Geraldine Solar PT, DPT

## 2018-07-29 ENCOUNTER — Telehealth (HOSPITAL_COMMUNITY): Payer: Self-pay

## 2018-07-29 NOTE — Telephone Encounter (Signed)
Weekly phone call made to pt to check-in on her HEP and her current status. Pt stating that her HEP was going well so far and no changes to the current program were desired at this time. She said it is still swollen and painful so she is waiting on her surgery which is scheduled for 09/16/2018. PT educated pt that she could call our clinic with any questions or concerns but otherwise, we would check in with her again next week and she verbalized understanding.  Geraldine Solar PT, DPT

## 2018-07-31 ENCOUNTER — Encounter (HOSPITAL_COMMUNITY): Payer: Self-pay

## 2018-08-07 ENCOUNTER — Telehealth (HOSPITAL_COMMUNITY): Payer: Self-pay

## 2018-08-07 NOTE — Telephone Encounter (Signed)
Attempted to call Ms. Warzecha to check in with her and discuss how her exercises are going currently. I first called her home number and received a busy line, then called her mobile number and the mailbox is currently full. Will attempt to call again at later date/time to check in with patient.  Kipp Brood, PT, DPT, [facility] Physical Therapist with [facility]  08/07/2018 11:26 AM

## 2018-08-16 ENCOUNTER — Telehealth (HOSPITAL_COMMUNITY): Payer: Self-pay

## 2018-08-16 NOTE — Telephone Encounter (Signed)
Attempted to call Ms. Velador to check in with her and discuss how her exercises are going currently. I first called her home number and received a busy line, then called her mobile number and left a voice message. I let her know we were calling to touch base with her and see how her Lt knee is feeling and how her exercises are going. I informed her someone will be in the office if she has any questions or concerns and that she can call us at [phone].  Kipp Brood, PT, DPT, [facility] Physical Therapist with [facility]  08/16/2018 4:19 PM

## 2018-08-27 ENCOUNTER — Telehealth (HOSPITAL_COMMUNITY): Payer: Self-pay | Admitting: Physical Therapy

## 2018-08-27 NOTE — Telephone Encounter (Signed)
was contacted today by phone regarding resuming therapy services following our temporary reduction of Services secondary to COVID-19.  Patient identity was verified  Pt would like to resume services at this time.  States she is waiting a return call from orthopedist regarding an appointment.  Appointment made on 09/04/18.  Teena Irani, PTA/CLT [phone]

## 2018-08-28 ENCOUNTER — Encounter (HOSPITAL_COMMUNITY): Payer: Self-pay

## 2018-09-04 ENCOUNTER — Encounter (HOSPITAL_COMMUNITY): Payer: Self-pay

## 2018-09-04 ENCOUNTER — Ambulatory Visit (HOSPITAL_COMMUNITY): Payer: Medicaid Other | Attending: Orthopedic Surgery

## 2018-09-04 ENCOUNTER — Other Ambulatory Visit: Payer: Self-pay

## 2018-09-04 DIAGNOSIS — M6281 Muscle weakness (generalized): Secondary | ICD-10-CM | POA: Diagnosis not present

## 2018-09-04 DIAGNOSIS — R2689 Other abnormalities of gait and mobility: Secondary | ICD-10-CM

## 2018-09-04 DIAGNOSIS — M25562 Pain in left knee: Secondary | ICD-10-CM

## 2018-09-04 NOTE — Therapy (Signed)
[place], [place], [address] Phone: [phone]   Fax:  [phone]  Physical Therapy Re-Evaluation  Patient Details  Name: Kathryn [name] MRN: [id number] Date of Birth: 13-Nov-1970 Referring Provider (PT): Marchia Bond, MD   Encounter Date: 09/04/2018  PT End of Session - 09/04/18 1345    Visit Number  1    Number of Visits  12    Date for PT Re-Evaluation  10/16/18    Authorization Type  Medicaid [place] approved authorization from 3/6-->07/04/18    Authorization Time Period  cert: 07/16/6604 - 06/15/6008; 09/04/18-10/16/18    Authorization - Visit Number  0    Authorization - Number of Visits  3    PT Start Time  1309    PT Stop Time  9323    PT Time Calculation (min)  38 min    Activity Tolerance  Patient limited by pain;Patient tolerated treatment well;No increased pain    Behavior During Therapy  WFL for tasks assessed/performed       Past Medical History:  Diagnosis Date  . Asthma   . Chronic back pain   . Hypertension   . Neuropathy     Past Surgical History:  Procedure Laterality Date  . TUBAL LIGATION      There were no vitals filed for this visit.  Subjective Assessment - 09/04/18 1314    Subjective  Pt went to PCP this morning and she reports she updated her medicines. She states her BP was 156/100 this morning. Dr. Mardelle Matte wants to do surgery on her Lt knee and sent the paperwork over to her PCP but they are going wait on sending them back to Dr. Mardelle Matte until her BP goes down. She will see them next Tuesday and if it has decreased then they will send the paperwork and she will be able to schedule surgery.     How long can you stand comfortably?  2 minutes due to knee and back    How long can you walk comfortably?  about 5-10 feet    Currently in Pain?  Yes    Pain Score  10-Worst pain ever    Pain Location  Knee    Pain Orientation  Left    Pain Descriptors / Indicators  Sharp    Pain Type   Chronic pain    Pain Radiating Towards  reports numbness in Lt foot    Pain Onset  More than a month ago    Pain Frequency  Constant    Aggravating Factors   walking, transitional movements    Pain Relieving Factors  medicine         [facility] PT Assessment - 09/04/18 0001      Assessment   Medical Diagnosis  Bil knee Pain    Referring Provider (PT)  Marchia Bond, MD    Onset Date/Surgical Date  06/13/18   pt stated it got worse on 06/17/18   Prior Therapy  none      Precautions   Precautions  None      Restrictions   Weight Bearing Restrictions  No      Balance Screen   Has the patient fallen in the past 6 months  Yes    How many times?  2    Has the patient had a decrease in activity level because of a fear of falling?   Yes    Is the patient reluctant to leave their home because  of a fear of falling?   Yes      Friend  Private residence    Living Arrangements  Other relatives   2 grandkids, 110, 28 year old   Available Help at Discharge  Family   son and daughter check in on her   Type of [place] to enter    Entrance Stairs-Number of Steps  3    Entrance Stairs-Rails  None    Home Layout  One level    [place] - single point    Additional Comments  Pt reports she has an aid/care attendant Mon-Fri for 8 hours each day who assists her with bathing, dressing, and meal preparation.      Prior Function   Level of Independence  Needs assistance with homemaking;Needs assistance with ADLs;Independent with community mobility with device;Independent with household mobility with device    Comments  Patient has 2 grandchildren living with her. Her duaghter and son live close by and check in on her daily.       Cognition   Overall Cognitive Status  Within Functional Limits for tasks assessed      Observation/Other Assessments   Lower Extremity Functional Scale   4/80   was 5/80 on 07/03/18     Circumferential  Edema   Circumferential - Right  17.25    Circumferential - Left   17.5      AROM   Right Knee Extension  0    Right Knee Flexion  124    Left Knee Extension  0    Left Knee Flexion  111      Strength   Right Hip Flexion  4+/5    Right Hip Extension  4+/5    Right Hip ABduction  5/5    Left Hip Flexion  4/5    Left Hip Extension  4/5    Left Hip ABduction  4-/5    Right Knee Flexion  5/5    Right Knee Extension  5/5    Left Knee Flexion  3-/5    Left Knee Extension  4-/5    Right Ankle Dorsiflexion  5/5    Left Ankle Dorsiflexion  4+/5      Ambulation/Gait   Ambulation/Gait  Yes    Ambulation/Gait Assistance  5: Supervision    Ambulation Distance (Feet)  200 Feet   was 172; 2MWT   Assistive device  Straight cane    Gait Pattern  Decreased arm swing - right;Decreased arm swing - left;Decreased stance time - left;Decreased hip/knee flexion - left;Decreased weight shift to left;Antalgic;Wide base of support    Ambulation Surface  Level;Indoor    Gait velocity  .50 m/s   was .4 m/s       [facility] Adult PT Treatment/Exercise - 09/04/18 0001      Exercises   Exercises  Knee/Hip      Knee/Hip Exercises: Supine   Bridges  Both;1 set;10 reps    Bridges Limitations  3 sec holds      Knee/Hip Exercises: Prone   Hamstring Curl  1 set;10 reps;3 seconds    Hamstring Curl Limitations  Lt LE        PT Education - 09/04/18 1357    Education Details  Educated patient on plan to continue therapy in preparation for TKA with Dr. Mardelle Matte. Patient understands insurance authorization process. She was educated on HEP to perform this week.  Person(s) Educated  Patient    Methods  Explanation;Handout    Comprehension  Verbalized understanding;Returned demonstration       PT Short Term Goals - 09/04/18 1403      PT SHORT TERM GOAL #1   Title  Patient will be independent with HEP, updated PRN, to improve ROM and strength for Lt LE to improve functional mobility and QOL.    Baseline   3/18: Reports compliance with HEP daily    Time  2    Period  Weeks    Status  Revised    Target Date  09/18/18      PT SHORT TERM GOAL #2   Title  Patient will improve gait velocity to 0.6 m/s or greater during 2MWT with no rest breaks during test to deomnstrate improvement in endurance.    Baseline  09/04/18: 2MWT 200 ft = .50 m/s with SPC    Time  3    Status  Revised    Target Date  09/25/18      PT SHORT TERM GOAL #3   Title  Patient will improve LEFS score by 12 points to indicate significant reductions in limitation related to Lt kne pain for improved QOL and function.    Baseline  07/03/18: LEFS: 5/80 was 0/80; 09/04/18 = 4/80    Time  3    Period  Weeks    Status  Revised    Target Date  09/25/18        PT Long Term Goals - 09/04/18 1405      PT LONG TERM GOAL #1   Title  Patient will improve LEFS score by 24 points to indicate significant reductions in limitation related to Lt kne pain for improved QOL and function.    Time  6    Period  Weeks    Status  Revised    Target Date  10/16/18      PT LONG TERM GOAL #2   Title  Patient will improve gait velocity to 0.8 m/s or greater during 2MWT with no rest breaks during test to demonstrate improvement in endurance.    Time  6    Period  Weeks    Status  Revised    Target Date  10/16/18      PT LONG TERM GOAL #3   Title  Patient will achieve 0-120 degrees AROM on Lt knee to improve funcitonal gait and stair mobility to improve her access to eenter/exit her home.     Time  6    Period  Weeks    Status  Revised    Target Date  10/16/18      PT LONG TERM GOAL #4   Title  Patient will report pain no greater than 7/10 for the last week to indicate significant reduction     Time  6    Period  Weeks    Status  Revised    Target Date  10/16/18         Plan - 09/04/18 1407    Clinical Impression Statement  Ms. Ingman is returning to physical therapy after ~2 month break due to office closure due to COVID-19 spread.  Patient was re-evaluated today and found to have ongoing weakness in Lt LE compared to Rt and impaired ROM in Lt knee. She was limited with gait due to pain and required 2 standing rest breaks during 2MWT secondary to Lt knee pain. Pt is hoping to be scheduled soon for Lt  partial or total knee replacement and would like to participate in therapy to prepare for successful post surgical rehab. She will benefit from skilled PT to address impairments and provide education on expectations for post surgery recovery.    Personal Factors and Comorbidities  Education;Past/Current Experience;Fitness;Comorbidity 3+;Behavior Pattern;Other;Profession    Comorbidities  hypertension, major depressive disorder, bil knee osteoarthritis, chroinc pain, neuropathy,    history of substance abuse; unemployed applying for disability   Examination-Activity Limitations  Bathing;Caring for Others;Dressing;Stairs;Bend;Stand;Locomotion Level    Examination-Participation Restrictions  Medication Management;Cleaning;Meal Prep;Laundry    Stability/Clinical Decision Making  Evolving/Moderate complexity    Rehab Potential  Fair    PT Frequency  2x / week    PT Duration  6 weeks    PT Treatment/Interventions  ADLs/Self Care Home Management;Aquatic Therapy;Cryotherapy;Electrical Stimulation;Iontophoresis 4mg /ml Dexamethasone;Moist Heat;DME Instruction;Gait training;Stair training;Functional mobility training;Therapeutic activities;Therapeutic exercise;Balance training;Neuromuscular re-education;Patient/family education;Manual techniques;Passive range of motion;Splinting;Joint Manipulations    PT Next Visit Plan  Patient would benefit from PNE. Begin LE strengthening and limit exercises in standing at this time due to pain in Lt knee with weight bearing. Perform manual for pain relief PRN.    PT Home Exercise Plan  09/04/18 - bridge, hamstring curl    Consulted and Agree with Plan of Care  Patient       Patient will benefit from  skilled therapeutic intervention in order to improve the following deficits and impairments:  Abnormal gait, Decreased activity tolerance, Decreased endurance, Decreased range of motion, Decreased strength, Pain, Improper body mechanics, Decreased balance, Decreased mobility, Difficulty walking, Increased edema, Impaired flexibility, Postural dysfunction, Obesity, Hypomobility, Impaired perceived functional ability  Visit Diagnosis: Left knee pain, unspecified chronicity  Other abnormalities of gait and mobility  Muscle weakness (generalized)     Problem List Patient Active Problem List   Diagnosis Date Noted  . Enlarged uterus 02/20/2018  . Screening for colorectal cancer 02/20/2018  . Routine cervical smear 02/20/2018  . Encounter for gynecological examination with Papanicolaou smear of cervix 02/20/2018  . Osteoarthritis of left knee 12/27/2017  . MDD (major depressive disorder), recurrent episode, severe ([place]) 12/24/2017  . Lumbar back pain 05/11/2017  . Depression, major, single episode, moderate ([place]) 05/11/2017  . Low vitamin B12 level 05/11/2017  . Immunization due 05/11/2017  . Hypertension goal BP (blood pressure) < 140/90 01/11/2017    Kipp Brood, PT, DPT, [facility] Physical Therapist with [facility]  09/04/2018 2:16 PM    [facility] [address] [place], [place], [address] Phone: [phone]   Fax:  [phone]  Name: Kathryn [name] MRN: [id number] Date of Birth: 1971-01-21

## 2018-09-04 NOTE — Patient Instructions (Signed)
Supine Bridge reps: 10 sets: 3 hold: 3 seconds daily: 1  weekly: 7      Exercise image step 1   Exercise image step 2  Setup  Begin lying on your back with your arms resting at your sides, your legs bent at the knees and your feet flat on the ground. Movement  Tighten your abdominals and slowly lift your hips off the floor into a bridge position, keeping your back straight. Tip  Make sure to keep your trunk stiff throughout the exercise and your arms flat on the floor. Prone Knee Flexion reps: 10 sets: 3 hold: 5 seconds daily: 1  weekly: 7      Exercise image step 1   Exercise image step 2  Setup  Begin lying on your front on a bed or flat surface. Movement  Slowly bend your surgical leg as far as you can, hold briefly, then return to the starting position and repeat. Tip  Make sure to keep your foot in line with your leg during the exercise.

## 2018-09-11 ENCOUNTER — Other Ambulatory Visit: Payer: Self-pay

## 2018-09-11 ENCOUNTER — Encounter (HOSPITAL_COMMUNITY): Payer: Self-pay

## 2018-09-11 ENCOUNTER — Ambulatory Visit (HOSPITAL_COMMUNITY): Payer: Medicaid Other

## 2018-09-11 DIAGNOSIS — M6281 Muscle weakness (generalized): Secondary | ICD-10-CM

## 2018-09-11 DIAGNOSIS — R2689 Other abnormalities of gait and mobility: Secondary | ICD-10-CM | POA: Diagnosis not present

## 2018-09-11 DIAGNOSIS — M25562 Pain in left knee: Secondary | ICD-10-CM

## 2018-09-11 NOTE — Patient Instructions (Addendum)
Clamshell reps: 10 sets: 3 hold: 3 seconds daily: 1  weekly: 7      Exercise image step 1   Exercise image step 2  Setup  Begin lying on your side with your legs bent and feet together. Movement  Lift your top knee upward while keeping your feet together, then lower it back down and repeat. Tip  Make sure that your hips do not fall backward as you lift your leg. Focus your awareness on the movement of your leg.  4 Way Patellar Glide reps: 3 Time: 1 minute daily: 1 weekly: 7   Exercise image step 1   Exercise image step 2  do 3x 1 minute up and down, and 3x 1 minute side to side   Setup  Begin sitting upright on the edge of a chair with one leg forward. Movement  Place your fingers around your kneecap and gently move it inward. Hold briefly, then return to the starting position and repeat moving your knee cap outward, up toward your body, then down toward your toes. Tip  Make sure to keep your leg muscles relaxed during the exercise.

## 2018-09-11 NOTE — Therapy (Signed)
[place], [place], [address] Phone: [phone]   Fax:  [phone]  Physical Therapy Treatment  Patient Details  Name: Kathryn [name] MRN: [id number] Date of Birth: 09-Feb-1971 Referring Provider (PT): Marchia Bond, MD   Encounter Date: 09/11/2018  PT End of Session - 09/11/18 0831    Visit Number  2    Number of Visits  12    Date for PT Re-Evaluation  10/16/18    Authorization Type  Medicaid [place] approved (3 units 09/09/18-09/22/18)    Authorization Time Period  cert: 0/08/3974 - 7/34/1937; 09/04/18-10/16/18    Authorization - Visit Number  1    Authorization - Number of Visits  3    PT Start Time  0816    PT Stop Time  0902    PT Time Calculation (min)  46 min    Activity Tolerance  Patient limited by pain;Patient tolerated treatment well;No increased pain    Behavior During Therapy  WFL for tasks assessed/performed       Past Medical History:  Diagnosis Date  . Asthma   . Chronic back pain   . Hypertension   . Neuropathy     Past Surgical History:  Procedure Laterality Date  . TUBAL LIGATION      There were no vitals filed for this visit.  Subjective Assessment - 09/11/18 0819    Subjective  Patient reports she has fallen twice off of her porch since last session.  She reports she landed on her Rt side once and her Lt side the other time. She states her knee is aching at 9/10 today but that she has been doing her exercises every morning. She went to her PCP yesterday and they were happy with her BP of 138/80. She states they sent the paperwork to Dr. Luanna Cole office in preparation for surgery and did her blood work as well.     How long can you stand comfortably?  2 minutes due to knee and back    How long can you walk comfortably?  about 5-10 feet    Currently in Pain?  Yes    Pain Score  9     Pain Location  Knee    Pain Orientation  Left    Pain Descriptors / Indicators  Aching;Sharp    Pain Type   Chronic pain    Pain Onset  More than a month ago    Pain Frequency  Constant    Aggravating Factors   walking, transitional movement (up/down)    Pain Relieving Factors  medicine        [facility] Adult PT Treatment/Exercise - 09/11/18 0001      Exercises   Exercises  Knee/Hip      Knee/Hip Exercises: Supine   Bridges  Both;10 reps;2 sets    Bridges Limitations  3 sec holds    Straight Leg Raises  Both;1 set;10 reps    Straight Leg Raises Limitations  Lt LE 2x 5 reps      Knee/Hip Exercises: Prone   Hamstring Curl  1 set;10 reps;3 seconds    Hamstring Curl Limitations  Lt LE    Hip Extension  Both;1 set;10 reps      Manual Therapy   Manual Therapy  Joint mobilization    Manual therapy comments  performed seperately from other interventions    Joint Mobilization  AP glide to Lt tibiofemoral joint, 3x 30-45 seconds grade I oscillations; patellofemoral glides sup/inf,  med/lat 3x 1 minute grade II        PT Education - 09/11/18 5427    Education Details  Educated on exercises throughout and updated HEP.     Person(s) Educated  Patient    Methods  Explanation;Handout    Comprehension  Verbalized understanding       PT Short Term Goals - 09/04/18 1403      PT SHORT TERM GOAL #1   Title  Patient will be independent with HEP, updated PRN, to improve ROM and strength for Lt LE to improve functional mobility and QOL.    Baseline  3/18: Reports compliance with HEP daily    Time  2    Period  Weeks    Status  Revised    Target Date  09/18/18      PT SHORT TERM GOAL #2   Title  Patient will improve gait velocity to 0.6 m/s or greater during 2MWT with no rest breaks during test to deomnstrate improvement in endurance.    Baseline  09/04/18: 2MWT 200 ft = .50 m/s with SPC    Time  3    Status  Revised    Target Date  09/25/18      PT SHORT TERM GOAL #3   Title  Patient will improve LEFS score by 12 points to indicate significant reductions in limitation related to Lt kne pain  for improved QOL and function.    Baseline  07/03/18: LEFS: 5/80 was 0/80; 09/04/18 = 4/80    Time  3    Period  Weeks    Status  Revised    Target Date  09/25/18        PT Long Term Goals - 09/04/18 1405      PT LONG TERM GOAL #1   Title  Patient will improve LEFS score by 24 points to indicate significant reductions in limitation related to Lt kne pain for improved QOL and function.    Time  6    Period  Weeks    Status  Revised    Target Date  10/16/18      PT LONG TERM GOAL #2   Title  Patient will improve gait velocity to 0.8 m/s or greater during 2MWT with no rest breaks during test to demonstrate improvement in endurance.    Time  6    Period  Weeks    Status  Revised    Target Date  10/16/18      PT LONG TERM GOAL #3   Title  Patient will achieve 0-120 degrees AROM on Lt knee to improve funcitonal gait and stair mobility to improve her access to eenter/exit her home.     Time  6    Period  Weeks    Status  Revised    Target Date  10/16/18      PT LONG TERM GOAL #4   Title  Patient will report pain no greater than 7/10 for the last week to indicate significant reduction     Time  6    Period  Weeks    Status  Revised    Target Date  10/16/18         Plan - 09/11/18 0623    Clinical Impression Statement  Session focused on LE strengthening in NWB position as patient's pain was 9/10 upon arrival. She was able to progress hip strengthening. She reported pain/discomfort with SLR in prone and supine and continues to have greater difficulty with  Lt LE. She required a pillow under her LT hip in sidelying for hip abductor exercise and clamshell was performing instead of sidelying SLR due to discomfort. At EOS performed manual interventions to LT knee for pain reduction. Patient reported pain relief with Grade I mobilization to tibiofemoral joint and Grade II mobilization to patellofemoral joint. Instruction provided for pt to perform self mobilization of patellofemoral joint  at home and handout with updated HEP given. Patient will benefit from skilled PT interventions to address impairments and progress towards goals in preparation for Lt TKA.     Personal Factors and Comorbidities  Education;Past/Current Experience;Fitness;Comorbidity 3+;Behavior Pattern;Other;Profession    Comorbidities  hypertension, major depressive disorder, bil knee osteoarthritis, chroinc pain, neuropathy,    history of substance abuse; unemployed applying for disability   Examination-Activity Limitations  Bathing;Caring for Others;Dressing;Stairs;Bend;Stand;Locomotion Level    Examination-Participation Restrictions  Medication Management;Cleaning;Meal Prep;Laundry    Stability/Clinical Decision Making  Evolving/Moderate complexity    Rehab Potential  Fair    PT Frequency  2x / week    PT Duration  6 weeks    PT Treatment/Interventions  ADLs/Self Care Home Management;Aquatic Therapy;Cryotherapy;Electrical Stimulation;Iontophoresis 4mg /ml Dexamethasone;Moist Heat;DME Instruction;Gait training;Stair training;Functional mobility training;Therapeutic activities;Therapeutic exercise;Balance training;Neuromuscular re-education;Patient/family education;Manual techniques;Passive range of motion;Splinting;Joint Manipulations    PT Next Visit Plan  Patient would benefit from PNE. Begin LE strengthening and limit exercises in standing at this time due to pain in Lt knee with weight bearing. Perform joint mobs for pain relief, follow up on self mobilization.    PT Home Exercise Plan  09/04/18 - bridge, hamstring curl; 09/11/18 - patellof emoral mobs, clamshell    Consulted and Agree with Plan of Care  Patient       Patient will benefit from skilled therapeutic intervention in order to improve the following deficits and impairments:  Abnormal gait, Decreased activity tolerance, Decreased endurance, Decreased range of motion, Decreased strength, Pain, Improper body mechanics, Decreased balance, Decreased  mobility, Difficulty walking, Increased edema, Impaired flexibility, Postural dysfunction, Obesity, Hypomobility, Impaired perceived functional ability  Visit Diagnosis: Left knee pain, unspecified chronicity  Other abnormalities of gait and mobility  Muscle weakness (generalized)     Problem List Patient Active Problem List   Diagnosis Date Noted  . Enlarged uterus 02/20/2018  . Screening for colorectal cancer 02/20/2018  . Routine cervical smear 02/20/2018  . Encounter for gynecological examination with Papanicolaou smear of cervix 02/20/2018  . Osteoarthritis of left knee 12/27/2017  . MDD (major depressive disorder), recurrent episode, severe ([place]) 12/24/2017  . Lumbar back pain 05/11/2017  . Depression, major, single episode, moderate ([place]) 05/11/2017  . Low vitamin B12 level 05/11/2017  . Immunization due 05/11/2017  . Hypertension goal BP (blood pressure) < 140/90 01/11/2017    Kipp Brood, PT, DPT, [facility] Physical Therapist with [facility]  09/11/2018 9:15 AM    [place], [place], [address] Phone: [phone]   Fax:  [phone]  Name: Kathryn [name] MRN: [id number] Date of Birth: 03-03-1971

## 2018-09-18 ENCOUNTER — Ambulatory Visit (HOSPITAL_COMMUNITY): Payer: Medicaid Other | Attending: Orthopedic Surgery

## 2018-09-18 ENCOUNTER — Emergency Department (HOSPITAL_COMMUNITY)
Admission: EM | Admit: 2018-09-18 | Discharge: 2018-09-18 | Disposition: A | Discharge status: Home / Self Care | Payer: Medicaid Other | Attending: Emergency Medicine | Admitting: Emergency Medicine

## 2018-09-18 ENCOUNTER — Encounter (HOSPITAL_COMMUNITY): Payer: Self-pay | Admitting: Emergency Medicine

## 2018-09-18 ENCOUNTER — Emergency Department (HOSPITAL_COMMUNITY): Payer: Medicaid Other

## 2018-09-18 ENCOUNTER — Other Ambulatory Visit: Payer: Self-pay

## 2018-09-18 DIAGNOSIS — Z79899 Other long term (current) drug therapy: Secondary | ICD-10-CM | POA: Insufficient documentation

## 2018-09-18 DIAGNOSIS — Z20828 Contact with and (suspected) exposure to other viral communicable diseases: Secondary | ICD-10-CM | POA: Diagnosis not present

## 2018-09-18 DIAGNOSIS — Z87891 Personal history of nicotine dependence: Secondary | ICD-10-CM | POA: Insufficient documentation

## 2018-09-18 DIAGNOSIS — N390 Urinary tract infection, site not specified: Secondary | ICD-10-CM | POA: Diagnosis not present

## 2018-09-18 DIAGNOSIS — R059 Cough, unspecified: Secondary | ICD-10-CM

## 2018-09-18 DIAGNOSIS — R0981 Nasal congestion: Secondary | ICD-10-CM | POA: Diagnosis not present

## 2018-09-18 DIAGNOSIS — R05 Cough: Secondary | ICD-10-CM

## 2018-09-18 DIAGNOSIS — I10 Essential (primary) hypertension: Secondary | ICD-10-CM | POA: Insufficient documentation

## 2018-09-18 DIAGNOSIS — J45909 Unspecified asthma, uncomplicated: Secondary | ICD-10-CM | POA: Diagnosis not present

## 2018-09-18 LAB — CBC WITH DIFFERENTIAL/PLATELET
Abs Immature Granulocytes: 0.08 10*3/uL — ABNORMAL HIGH (ref 0.00–0.07)
Basophils Absolute: 0 10*3/uL (ref 0.0–0.1)
Basophils Relative: 0 %
Eosinophils Absolute: 0.2 10*3/uL (ref 0.0–0.5)
Eosinophils Relative: 2 %
HCT: 36.9 % (ref 36.0–46.0)
Hemoglobin: 12.4 g/dL (ref 12.0–15.0)
Immature Granulocytes: 1 %
Lymphocytes Relative: 27 %
Lymphs Abs: 3.4 10*3/uL (ref 0.7–4.0)
MCH: 29.9 pg (ref 26.0–34.0)
MCHC: 33.6 g/dL (ref 30.0–36.0)
MCV: 88.9 fL (ref 80.0–100.0)
Monocytes Absolute: 0.8 10*3/uL (ref 0.1–1.0)
Monocytes Relative: 6 %
Neutro Abs: 8.2 10*3/uL — ABNORMAL HIGH (ref 1.7–7.7)
Neutrophils Relative %: 64 %
Platelets: 494 10*3/uL — ABNORMAL HIGH (ref 150–400)
RBC: 4.15 MIL/uL (ref 3.87–5.11)
RDW: 14.3 % (ref 11.5–15.5)
WBC: 12.7 10*3/uL — ABNORMAL HIGH (ref 4.0–10.5)
nRBC: 0 % (ref 0.0–0.2)

## 2018-09-18 LAB — COMPREHENSIVE METABOLIC PANEL
ALT: 22 U/L (ref 0–44)
AST: 20 U/L (ref 15–41)
Albumin: 3.8 g/dL (ref 3.5–5.0)
Alkaline Phosphatase: 75 U/L (ref 38–126)
Anion gap: 14 (ref 5–15)
BUN: 10 mg/dL (ref 6–20)
CO2: 23 mmol/L (ref 22–32)
Calcium: 9.2 mg/dL (ref 8.9–10.3)
Chloride: 99 mmol/L (ref 98–111)
Creatinine, Ser: 0.82 mg/dL (ref 0.44–1.00)
GFR calc Af Amer: >60 mL/min (ref >60)
GFR calc non Af Amer: >60 mL/min (ref >60)
Glucose, Bld: 86 mg/dL (ref 70–99)
Potassium: 4 mmol/L (ref 3.5–5.1)
Sodium: 136 mmol/L (ref 135–145)
Total Bilirubin: 0.5 mg/dL (ref 0.3–1.2)
Total Protein: 7.7 g/dL (ref 6.5–8.1)

## 2018-09-18 LAB — URINALYSIS, ROUTINE W REFLEX MICROSCOPIC
Bacteria, UA: NONE SEEN
Bilirubin Urine: NEGATIVE
Glucose, UA: NEGATIVE mg/dL
Hgb urine dipstick: NEGATIVE
Ketones, ur: NEGATIVE mg/dL
Nitrite: NEGATIVE
Protein, ur: NEGATIVE mg/dL
Specific Gravity, Urine: 1.016 (ref 1.005–1.030)
pH: 6 (ref 5.0–8.0)

## 2018-09-18 LAB — POC URINE PREG, ED: Preg Test, Ur: NEGATIVE

## 2018-09-18 LAB — LIPASE, BLOOD: Lipase: 36 U/L (ref 11–51)

## 2018-09-18 LAB — TROPONIN I: Troponin I: <0.03 ng/mL (ref <0.03)

## 2018-09-18 LAB — SARS CORONAVIRUS 2 BY RT PCR (HOSPITAL ORDER, PERFORMED IN ~~LOC~~ HOSPITAL LAB): SARS Coronavirus 2: NEGATIVE

## 2018-09-18 MED ORDER — CEPHALEXIN 500 MG PO CAPS: 500.0000 mg | ORAL_CAPSULE | Freq: Four times a day (QID) | ORAL | 0 refills | Status: DC

## 2018-09-18 MED ORDER — BENZONATATE 200 MG PO CAPS: 200.0000 mg | ORAL_CAPSULE | Freq: Three times a day (TID) | ORAL | 0 refills | Status: DC | PRN

## 2018-09-18 MED ORDER — ACETAMINOPHEN 325 MG PO TABS
650.0000 mg | ORAL_TABLET | Freq: Once | ORAL | Status: AC
  Administered 2018-09-18: 10:00:00 650 mg via ORAL
  Filled 2018-09-18: qty 2

## 2018-09-18 NOTE — ED Provider Notes (Signed)
[facility] EMERGENCY DEPARTMENT Provider Note   CSN: [id number] Arrival date & time: 09/18/18  0747    History   Chief Complaint Chief Complaint  Patient presents with  . Cough  . Generalized Body Aches    HPI DAINE CROKER is a 48 y.o. female.     HPI   LARUEN RISSER is a 48 y.o. female who presents to the Emergency Department complaining of generalized body aches, cough, fever and nasal congestion.  Symptoms have been present for 2 days.  Fever is subjective, she hasn't checked her temperature at home.  She states that her cough is productive at times.  No known COVID exposures, but admits to going to her scheduled medical appts.  No recent COVID testing.  She states her symptoms have been associated with tightness in her chest and a couple episodes of vomiting and loose stools and some burning with urination at times.  She reports shortness of breath associated with cough only.  She has been using her albuterol nebs intermittently. She denies sore throat and rash.  Her 12 yo daughter is also here for evaluation of a cough and runny nose.     Past Medical History:  Diagnosis Date  . Asthma   . Chronic back pain   . Hypertension   . Neuropathy     Patient Active Problem List   Diagnosis Date Noted  . Enlarged uterus 02/20/2018  . Screening for colorectal cancer 02/20/2018  . Routine cervical smear 02/20/2018  . Encounter for gynecological examination with Papanicolaou smear of cervix 02/20/2018  . Osteoarthritis of left knee 12/27/2017  . MDD (major depressive disorder), recurrent episode, severe ([place]) 12/24/2017  . Lumbar back pain 05/11/2017  . Depression, major, single episode, moderate ([place]) 05/11/2017  . Low vitamin B12 level 05/11/2017  . Immunization due 05/11/2017  . Hypertension goal BP (blood pressure) < 140/90 01/11/2017    Past Surgical History:  Procedure Laterality Date  . TUBAL LIGATION       OB History    Gravida  5   Para  3   Term  2    Preterm  1   AB  2   Living  3     SAB  1   TAB  1   Ectopic      Multiple      Live Births               Home Medications    Prior to Admission medications   Medication Sig Start Date End Date Taking? Authorizing Provider  acetaminophen-codeine (TYLENOL #3) 300-30 MG tablet Take by mouth every 4 (four) hours as needed for moderate pain.    [provider]  albuterol (PROVENTIL HFA;VENTOLIN HFA) 108 (90 Base) MCG/ACT inhaler Inhale 2 puffs into the lungs 2 (two) times daily.    [provider]  albuterol (PROVENTIL) (2.5 MG/3ML) 0.083% nebulizer solution Take 2.5 mg by nebulization every 6 (six) hours as needed for wheezing or shortness of breath.    [provider]  amLODipine (NORVASC) 5 MG tablet Take 5 mg by mouth daily.    [provider]  azithromycin (ZITHROMAX) 250 MG tablet Take 1 tablet (250 mg total) by mouth daily. Take first 2 tablets together, then 1 every day until finished. Patient not taking: Reported on 06/20/2018 04/26/18   Lily Kocher, PA-C  benzonatate (TESSALON) 100 MG capsule Take 2 capsules (200 mg total) by mouth 3 (three) times daily as needed. Patient  not taking: Reported on 07/04/2018 04/19/18   Evalee Jefferson, PA-C  cephALEXin (KEFLEX) 500 MG capsule Take 1 capsule (500 mg total) by mouth 4 (four) times daily. Patient not taking: Reported on 07/04/2018 04/19/18   Kem Parkinson, PA-C  cholecalciferol (VITAMIN D3) 25 MCG (1000 UT) tablet Take 2,000 Units by mouth daily.    [provider]  dexamethasone (DECADRON) 4 MG tablet Take 1 tablet (4 mg total) by mouth 2 (two) times daily with a meal. Patient not taking: Reported on 09/04/2018 05/10/18   Lily Kocher, PA-C  DULoxetine (CYMBALTA) 60 MG capsule Take 1 capsule (60 mg total) by mouth daily. 12/29/17   Starkes-Perry, Gayland Curry, FNP  famotidine (PEPCID) 20 MG tablet Take 1 tablet (20 mg total) by mouth 2 (two) times daily. 02/06/18   Virgel Manifold, MD   fluticasone (VERAMYST) 27.5 MCG/SPRAY nasal spray Place 2 sprays into the nose daily.    [provider]  gabapentin (NEURONTIN) 300 MG capsule Take 1 capsule (300 mg total) by mouth 3 (three) times daily. 12/28/17   Starkes-Perry, Gayland Curry, FNP  hydrochlorothiazide (MICROZIDE) 12.5 MG capsule Take 12.5 mg by mouth daily.    [provider]  HYDROcodone-acetaminophen (NORCO/VICODIN) 5-325 MG tablet Take 1 tablet by mouth every 4 (four) hours as needed. Patient not taking: Reported on 09/04/2018 04/26/18   Lily Kocher, PA-C  loratadine (CLARITIN) 10 MG tablet Take 5 mg by mouth 2 (two) times daily.    [provider]  losartan (COZAAR) 50 MG tablet Take 50 mg by mouth daily.     [provider]  methocarbamol (ROBAXIN) 750 MG tablet Take 750 mg by mouth 4 (four) times daily.    [provider]  mirtazapine (REMERON) 30 MG tablet Take 30 mg by mouth at bedtime.    [provider]  omeprazole (PRILOSEC) 20 MG capsule Take 1 po BID x 2 weeks then once a day Patient not taking: Reported on 09/11/2018 03/24/18   Rolland Porter, MD  ondansetron (ZOFRAN ODT) 4 MG disintegrating tablet 4mg  ODT q4 hours prn nausea/vomit Patient not taking: Reported on 06/20/2018 03/21/18   Milton Ferguson, MD  ondansetron (ZOFRAN) 4 MG tablet Take 1 tablet (4 mg total) by mouth every 6 (six) hours. Patient not taking: Reported on 07/04/2018 04/26/18   Lily Kocher, PA-C  pantoprazole (PROTONIX) 20 MG tablet Take 1 tablet (20 mg total) by mouth daily. Patient not taking: Reported on 09/11/2018 03/21/18   Milton Ferguson, MD  potassium chloride SA (K-DUR,KLOR-CON) 20 MEQ tablet Take 1 tablet (20 mEq total) by mouth 2 (two) times daily. 04/19/18   Evalee Jefferson, PA-C  promethazine-codeine (PHENERGAN WITH CODEINE) 6.25-10 MG/5ML syrup Take 5 mLs by mouth every 4 (four) hours as needed for cough. Patient not taking: Reported on 06/20/2018 04/19/18   Evalee Jefferson, PA-C   promethazine-dextromethorphan (PROMETHAZINE-DM) 6.25-15 MG/5ML syrup Take 5 mLs by mouth 4 (four) times daily as needed. Patient not taking: Reported on 07/04/2018 05/10/18   Lily Kocher, PA-C  traZODone (DESYREL) 50 MG tablet Take 1 tablet (50 mg total) by mouth at bedtime. 12/28/17   Suella Broad, FNP    Family History Family History  Problem Relation Age of Onset  . Gout Paternal Grandfather   . Cirrhosis Paternal Grandfather   . Hypertension Paternal Grandmother   . Aneurysm Paternal Grandmother   . Cirrhosis Maternal Grandmother   . Cirrhosis Maternal Grandfather   . Cancer Father   . Cirrhosis Father   . Cirrhosis  Mother   . Breast cancer Sister   . Hypertension Sister   . Bronchitis Daughter   . Bronchitis Daughter   . Asthma Son   . Bronchitis Son   . Migraines Neg Hx     Social History Social History   Tobacco Use  . Smoking status: Former Smoker    Types: Cigarettes  . Smokeless tobacco: Never Used  Substance Use Topics  . Alcohol use: Yes    Comment: occ  . Drug use: Not Currently    Types: Cocaine    Comment: crack     Allergies   Clonopin [clonazepam]; Fish allergy; Flexeril [cyclobenzaprine hcl]; Ibuprofen; Ace inhibitors; and Tramadol   Review of Systems Review of Systems  Constitutional: Positive for appetite change and fever. Negative for activity change and chills.  HENT: Positive for congestion. Negative for facial swelling, rhinorrhea, sore throat and trouble swallowing.   Eyes: Negative for visual disturbance.  Respiratory: Positive for cough, chest tightness and shortness of breath. Negative for wheezing and stridor.   Cardiovascular: Negative for chest pain and leg swelling.  Gastrointestinal: Positive for diarrhea, nausea and vomiting. Negative for abdominal pain.  Genitourinary: Positive for dysuria. Negative for decreased urine volume.  Musculoskeletal: Positive for myalgias. Negative for neck pain and neck stiffness.  Skin:  Negative for rash.  Neurological: Negative for dizziness, weakness, numbness and headaches.  Hematological: Negative for adenopathy.  Psychiatric/Behavioral: Negative for confusion.     Physical Exam Updated Vital Signs BP 121/83   Pulse (!) 102   Temp 99.2 F (37.3 C) (Oral)   Resp 19   Ht 4\' 11"  (1.499 m)   Wt 89.4 kg   SpO2 100%   BMI 39.79 kg/m   Physical Exam Vitals signs and nursing note reviewed.  Constitutional:      Appearance: Normal appearance. She is not toxic-appearing.  HENT:     Mouth/Throat:     Mouth: Mucous membranes are moist.  Neck:     Musculoskeletal: Normal range of motion. No muscular tenderness.  Cardiovascular:     Rate and Rhythm: Normal rate and regular rhythm.     Pulses: Normal pulses.  Pulmonary:     Effort: Pulmonary effort is normal.     Breath sounds: Normal breath sounds. No wheezing or rales.  Abdominal:     General: There is no distension.     Palpations: Abdomen is soft.     Tenderness: There is no abdominal tenderness. There is no right CVA tenderness, left CVA tenderness or guarding.  Musculoskeletal: Normal range of motion.     Right lower leg: No edema.     Left lower leg: No edema.  Lymphadenopathy:     Cervical: No cervical adenopathy.  Skin:    General: Skin is warm.     Capillary Refill: Capillary refill takes less than 2 seconds.     Findings: No rash.  Neurological:     General: No focal deficit present.     Mental Status: She is alert.      ED Treatments / Results  Labs (all labs ordered are listed, but only abnormal results are displayed) Labs Reviewed  CBC WITH DIFFERENTIAL/PLATELET - Abnormal; Notable for the following components:      Result Value   WBC 12.7 (*)    Platelets 494 (*)    Neutro Abs 8.2 (*)    Abs Immature Granulocytes 0.08 (*)    All other components within normal limits  URINALYSIS, ROUTINE W REFLEX MICROSCOPIC -  Abnormal; Notable for the following components:   APPearance HAZY (*)     Leukocytes,Ua SMALL (*)    All other components within normal limits  SARS CORONAVIRUS 2 (HOSPITAL ORDER, [facility])  URINE CULTURE  COMPREHENSIVE METABOLIC PANEL  TROPONIN I  LIPASE, BLOOD  POC URINE PREG, ED    EKG None  Radiology Dg Chest Portable 1 View  Result Date: 09/18/2018 CLINICAL DATA:  48 year old female with fever cough nausea and vomiting for 2 days. EXAM: PORTABLE CHEST 1 VIEW COMPARISON:  Two-view chest radiographs 07/04/2018 and earlier. FINDINGS: Portable AP upright view at 0917 hours. Lung volumes and mediastinal contours remain normal. Visualized tracheal air column is within normal limits. Allowing for portable technique the lungs are clear. Paucity of bowel gas in the upper abdomen. No osseous abnormality identified. IMPRESSION: Negative portable chest. Electronically Signed   By: Genevie Ann M.D.   On: 09/18/2018 10:07    Procedures Procedures (including critical care time)  Medications Ordered in ED Medications  acetaminophen (TYLENOL) tablet 650 mg (has no administration in time range)     Initial Impression / Assessment and Plan / ED Course  I have reviewed the triage vital signs and the nursing notes.  Pertinent labs & imaging results that were available during my care of the patient were reviewed by me and considered in my medical decision making (see chart for details).        Pt with cough, subjective fever at home.  She is well appearing here, non-toxic.  Work up today is reassuring.  COVID test negative.  She does admit to some dysuria sx's and U/A indicates likely infection, so I will tx with Keflex at least until urine culture results.    Pt reports feeling better.  Sx's have improved after tylenol.  She appears appropriate for d/c home and out pt f/u if needed.    Final Clinical Impressions(s) / ED Diagnoses   Final diagnoses:  Cough  Urinary tract infection in female    ED Discharge Orders    None        Kem Parkinson, PA-C 09/18/18 [place], [place], DO 09/19/18 1837

## 2018-09-18 NOTE — Discharge Instructions (Addendum)
Drink plenty of water.  Take the antibiotic as directed until its finished.  Continue your albuterol treatments every 4-6 hrs as needed.  Follow-up with your doctor for recheck

## 2018-09-18 NOTE — ED Triage Notes (Signed)
Pt c/o of cough, fever, body aches x 3 days

## 2018-09-20 ENCOUNTER — Telehealth (HOSPITAL_COMMUNITY): Payer: Self-pay

## 2018-09-20 ENCOUNTER — Ambulatory Visit (HOSPITAL_COMMUNITY): Payer: Medicaid Other

## 2018-09-20 LAB — URINE CULTURE: Culture: 60000 — AB

## 2018-09-20 NOTE — Telephone Encounter (Signed)
No show #1; called pt re missed appointment. Pt stating she forgot about it but that she went to the hospital the other day; she was told she had a kidney infection. PT reminded pt of her next appointment and told her that more visits had been requested from her insurance and our office would be in touch if approval wasn't received before her next appointment and she verbalized understanding.   Geraldine Solar PT, DPT

## 2018-09-25 ENCOUNTER — Encounter (HOSPITAL_COMMUNITY): Payer: Self-pay

## 2018-09-25 ENCOUNTER — Telehealth (HOSPITAL_COMMUNITY): Payer: Self-pay | Admitting: Internal Medicine

## 2018-09-25 ENCOUNTER — Ambulatory Visit (HOSPITAL_COMMUNITY): Payer: Medicaid Other

## 2018-09-25 NOTE — Telephone Encounter (Signed)
pt called to cx both of these appts said that she would call back to reschedule  09/25/18

## 2018-09-27 ENCOUNTER — Ambulatory Visit (HOSPITAL_COMMUNITY): Payer: Medicaid Other

## 2018-09-30 NOTE — Progress Notes (Signed)
OK TO USE 6-3 20 EKG PER JESSICA ZANETTO PA

## 2018-09-30 NOTE — Patient Instructions (Addendum)
CANDICE TOBEY     Your procedure is scheduled on: 10-08-2018   Report to [facility] Main  Entrance    Report to admitting at [place] [facility] TEST ON_______ @_______ , THIS TEST MUST BE DONE BEFORE SURGERY, COME TO [place]. ONCE YOUR COVID TEST IS COMPLETED, PLEASE BEGIN THE QUARANTINE INSTRUCTIONS AS OUTLINED IN YOUR HANDOUT.   Call this number if you have problems the morning of surgery [phone]    Remember: . BRUSH YOUR TEETH MORNING OF SURGERY AND RINSE YOUR MOUTH OUT, NO CHEWING GUM CANDY OR MINTS.   NO SOLID FOOD AFTER MIDNIGHT THE NIGHT PRIOR TO SURGERY. NOTHING BY MOUTH EXCEPT CLEAR LIQUIDS UNTIL 430 AM. PLEASE FINISH ENSURE DRINK PER SURGEON ORDER  WHICH NEEDS TO BE COMPLETED AT 430 AM    CLEAR LIQUID DIET   Foods Allowed                                                                     Foods Excluded  Coffee and tea, regular and decaf                             liquids that you cannot  Plain Jell-O in any flavor                                             see through such as: Fruit ices (not with fruit pulp)                                     milk, soups, orange juice  Iced Popsicles                                    All solid food Carbonated beverages, regular and diet                                    Cranberry, grape and apple juices Sports drinks like Gatorade Lightly seasoned clear broth or consume(fat free) Sugar, honey syrup  Sample Menu Breakfast                                Lunch                                     Supper Cranberry juice                    Beef broth  Chicken broth Jell-O                                     Grape juice                           Apple juice Coffee or tea                        Jell-O                                      Popsicle                                                Coffee or tea                         Coffee or tea  _____________________________________________________________________     Take these medicines the morning of surgery with A SIP OF WATER:                                You may not have any metal on your body including hair pins and              piercings     Do not wear jewelry, make-up, lotions, powders or perfumes, deodorant             Do not wear nail polish.  Do not shave  48 hours prior to surgery.     Do not bring valuables to the hospital. [place].  Contacts, dentures or bridgework may not be worn into surgery.       _____________________________________________________________________             [facility] - Preparing for Surgery Before surgery, you can play an important role.  Because skin is not sterile, your skin needs to be as free of germs as possible.  You can reduce the number of germs on your skin by washing with CHG (chlorahexidine gluconate) soap before surgery.  CHG is an antiseptic cleaner which kills germs and bonds with the skin to continue killing germs even after washing. Please DO NOT use if you have an allergy to CHG or antibacterial soaps.  If your skin becomes reddened/irritated stop using the CHG and inform your nurse when you arrive at Short Stay. Do not shave (including legs and underarms) for at least 48 hours prior to the first CHG shower.  You may shave your face/neck. Please follow these instructions carefully:  1.  Shower with CHG Soap the night before surgery and the  morning of Surgery.  2.  If you choose to wash your hair, wash your hair first as usual with your  normal  shampoo.  3.  After you shampoo, rinse your hair and body thoroughly to remove the  shampoo.  4.  Use CHG as you would any other liquid soap.  You can apply chg directly  to the skin and wash                       Gently with a scrungie or clean washcloth.  5.  Apply the  CHG Soap to your body ONLY FROM THE NECK DOWN.   Do not use on face/ open                           Wound or open sores. Avoid contact with eyes, ears mouth and genitals (private parts).                       Wash face,  Genitals (private parts) with your normal soap.             6.  Wash thoroughly, paying special attention to the area where your surgery  will be performed.  7.  Thoroughly rinse your body with warm water from the neck down.  8.  DO NOT shower/wash with your normal soap after using and rinsing off  the CHG Soap.                9.  Pat yourself dry with a clean towel.            10.  Wear clean pajamas.            11.  Place clean sheets on your bed the night of your first shower and do not  sleep with pets. Day of Surgery : Do not apply any lotions/deodorants the morning of surgery.  Please wear clean clothes to the hospital/surgery center.  FAILURE TO FOLLOW THESE INSTRUCTIONS MAY RESULT IN THE CANCELLATION OF YOUR SURGERY PATIENT SIGNATURE_________________________________  NURSE SIGNATURE__________________________________  ________________________________________________________________________   Adam Phenix  An incentive spirometer is a tool that can help keep your lungs clear and active. This tool measures how well you are filling your lungs with each breath. Taking long deep breaths may help reverse or decrease the chance of developing breathing (pulmonary) problems (especially infection) following:  A long period of time when you are unable to move or be active. BEFORE THE PROCEDURE   If the spirometer includes an indicator to show your best effort, your nurse or respiratory therapist will set it to a desired goal.  If possible, sit up straight or lean slightly forward. Try not to slouch.  Hold the incentive spirometer in an upright position. INSTRUCTIONS FOR USE  1. Sit on the edge of your bed if possible, or sit up as far as you can in bed or on a  chair. 2. Hold the incentive spirometer in an upright position. 3. Breathe out normally. 4. Place the mouthpiece in your mouth and seal your lips tightly around it. 5. Breathe in slowly and as deeply as possible, raising the piston or the ball toward the top of the column. 6. Hold your breath for 3-5 seconds or for as long as possible. Allow the piston or ball to fall to the bottom of the column. 7. Remove the mouthpiece from your mouth and breathe out normally. 8. Rest for a few seconds and repeat Steps 1 through 7 at least 10 times every 1-2 hours when you are awake. Take your time and take a few normal breaths between deep breaths. 9. The spirometer may include an indicator to  show your best effort. Use the indicator as a goal to work toward during each repetition. 10. After each set of 10 deep breaths, practice coughing to be sure your lungs are clear. If you have an incision (the cut made at the time of surgery), support your incision when coughing by placing a pillow or rolled up towels firmly against it. Once you are able to get out of bed, walk around indoors and cough well. You may stop using the incentive spirometer when instructed by your caregiver.  RISKS AND COMPLICATIONS  Take your time so you do not get dizzy or light-headed.  If you are in pain, you may need to take or ask for pain medication before doing incentive spirometry. It is harder to take a deep breath if you are having pain. AFTER USE  Rest and breathe slowly and easily.  It can be helpful to keep track of a log of your progress. Your caregiver can provide you with a simple table to help with this. If you are using the spirometer at home, follow these instructions: [place] IF:   You are having difficultly using the spirometer.  You have trouble using the spirometer as often as instructed.  Your pain medication is not giving enough relief while using the spirometer.  You develop fever of 100.5 F  (38.1 C) or higher. SEEK IMMEDIATE MEDICAL CARE IF:   You cough up bloody sputum that had not been present before.  You develop fever of 102 F (38.9 C) or greater.  You develop worsening pain at or near the incision site. MAKE SURE YOU:   Understand these instructions.  Will watch your condition.  Will get help right away if you are not doing well or get worse. Document Released: 08/14/2006 Document Revised: 06/26/2011 Document Reviewed: 10/15/2006 [facility] Patient Information 2014 [place], [place].   ________________________________________________________________________

## 2018-09-30 NOTE — Progress Notes (Signed)
CHEST 1 VIEW XRAY 6-3- 30 Epic EKG 09-18-18 EPIC

## 2018-10-01 ENCOUNTER — Encounter (HOSPITAL_COMMUNITY)
Admit: 2018-10-01 | Discharge: 2018-10-01 | Disposition: A | Discharge status: Home / Self Care | Payer: Medicaid Other | Attending: Orthopedic Surgery | Admitting: Orthopedic Surgery

## 2018-10-03 NOTE — Patient Instructions (Addendum)
Kathryn [name]    Your procedure is scheduled on:10-08-2018   Report to [facility] Main  Entrance  Report to admitting at [facility] TEST ON_______ @_______ , THIS TEST MUST BE DONE BEFORE SURGERY, COME TO [place]. ONCE YOUR COVID TEST IS COMPLETED, PLEASE BEGIN THE QUARANTINE INSTRUCTIONS AS OUTLINED IN YOUR HANDOUT.   Call this number if you have problems the morning of surgery [phone]    Remember:YOU [place], NO CHEWING GUM CANDY OR MINTS.   NO SOLID FOOD AFTER MIDNIGHT THE NIGHT PRIOR TO SURGERY. NOTHING BY MOUTH EXCEPT CLEAR LIQUIDS UNTIL 810 AM. PLEASE FINISH ENSURE DRINK PER SURGEON ORDER 3 HOURS PRIOR TO SCHEDULED SURGERY TIME WHICH NEEDS TO BE COMPLETED AT 740 AM.   CLEAR LIQUID DIET   Foods Allowed                                                                     Foods Excluded  Coffee and tea, regular and decaf                             liquids that you cannot  Plain Jell-O in any flavor                                             see through such as: Fruit ices (not with fruit pulp)                                     milk, soups, orange juice  Iced Popsicles                                    All solid food Carbonated beverages, regular and diet                                    Cranberry, grape and apple juices Sports drinks like Gatorade Lightly seasoned clear broth or consume(fat free) Sugar, honey syrup  Sample Menu Breakfast                                Lunch                                     Supper Cranberry juice                    Beef broth  Chicken broth Jell-O                                     Grape juice                           Apple juice Coffee or tea                        Jell-O                                      Popsicle                                                Coffee or  tea                        Coffee or tea  _____________________________________________________________________     Take these medicines the morning of surgery with A SIP OF WATER: ALBUTEROL INHALER IF NEEDED AND BRING INAHLER, GABAPENTIN (NEURONTIN), CITALOPRAM (CELEXA), LORATADINE (CLARITIN), AMLODIPINE (NORVASC), MEDROL DOSE PACK, FLONASE NASAL SPRAY, OMEPRAZOLE, FAMOTIDINE (PEPCID)                                You may not have any metal on your body including hair pins and              piercings  Do not wear jewelry, make-up, lotions, powders or perfumes, deodorant             Do not wear nail polish.  Do not shave  48 hours prior to surgery.                Do not bring valuables to the hospital. [place].  Contacts, dentures or bridgework may not be worn into surgery.  Leave suitcase in the car. After surgery it may be brought to your room.      _____________________________________________________________________             [facility] - Preparing for Surgery Before surgery, you can play an important role.  Because skin is not sterile, your skin needs to be as free of germs as possible.  You can reduce the number of germs on your skin by washing with CHG (chlorahexidine gluconate) soap before surgery.  CHG is an antiseptic cleaner which kills germs and bonds with the skin to continue killing germs even after washing. Please DO NOT use if you have an allergy to CHG or antibacterial soaps.  If your skin becomes reddened/irritated stop using the CHG and inform your nurse when you arrive at Short Stay. Do not shave (including legs and underarms) for at least 48 hours prior to the first CHG shower.  You may shave your face/neck. Please follow these instructions carefully:  1.  Shower with CHG Soap the night before surgery and the  morning of Surgery.  2.  If you choose to wash your hair, wash your  hair first as usual with your   normal  shampoo.  3.  After you shampoo, rinse your hair and body thoroughly to remove the  shampoo.                           4.  Use CHG as you would any other liquid soap.  You can apply chg directly  to the skin and wash                       Gently with a scrungie or clean washcloth.  5.  Apply the CHG Soap to your body ONLY FROM THE NECK DOWN.   Do not use on face/ open                           Wound or open sores. Avoid contact with eyes, ears mouth and genitals (private parts).                       Wash face,  Genitals (private parts) with your normal soap.             6.  Wash thoroughly, paying special attention to the area where your surgery  will be performed.  7.  Thoroughly rinse your body with warm water from the neck down.  8.  DO NOT shower/wash with your normal soap after using and rinsing off  the CHG Soap.                9.  Pat yourself dry with a clean towel.            10.  Wear clean pajamas.            11.  Place clean sheets on your bed the night of your first shower and do not  sleep with pets. Day of Surgery : Do not apply any lotions/deodorants the morning of surgery.  Please wear clean clothes to the hospital/surgery center.  FAILURE TO FOLLOW THESE INSTRUCTIONS MAY RESULT IN THE CANCELLATION OF YOUR SURGERY PATIENT SIGNATURE_________________________________  NURSE SIGNATURE__________________________________  ________________________________________________________________________   Adam Phenix  An incentive spirometer is a tool that can help keep your lungs clear and active. This tool measures how well you are filling your lungs with each breath. Taking long deep breaths may help reverse or decrease the chance of developing breathing (pulmonary) problems (especially infection) following:  A long period of time when you are unable to move or be active. BEFORE THE PROCEDURE   If the spirometer includes an indicator to show your best effort, your  nurse or respiratory therapist will set it to a desired goal.  If possible, sit up straight or lean slightly forward. Try not to slouch.  Hold the incentive spirometer in an upright position. INSTRUCTIONS FOR USE  1. Sit on the edge of your bed if possible, or sit up as far as you can in bed or on a chair. 2. Hold the incentive spirometer in an upright position. 3. Breathe out normally. 4. Place the mouthpiece in your mouth and seal your lips tightly around it. 5. Breathe in slowly and as deeply as possible, raising the piston or the ball toward the top of the column. 6. Hold your breath for 3-5 seconds or for as long as possible. Allow the piston or ball to fall to the bottom of the column. 7.  Remove the mouthpiece from your mouth and breathe out normally. 8. Rest for a few seconds and repeat Steps 1 through 7 at least 10 times every 1-2 hours when you are awake. Take your time and take a few normal breaths between deep breaths. 9. The spirometer may include an indicator to show your best effort. Use the indicator as a goal to work toward during each repetition. 10. After each set of 10 deep breaths, practice coughing to be sure your lungs are clear. If you have an incision (the cut made at the time of surgery), support your incision when coughing by placing a pillow or rolled up towels firmly against it. Once you are able to get out of bed, walk around indoors and cough well. You may stop using the incentive spirometer when instructed by your caregiver.  RISKS AND COMPLICATIONS  Take your time so you do not get dizzy or light-headed.  If you are in pain, you may need to take or ask for pain medication before doing incentive spirometry. It is harder to take a deep breath if you are having pain. AFTER USE  Rest and breathe slowly and easily.  It can be helpful to keep track of a log of your progress. Your caregiver can provide you with a simple table to help with this. If you are using the  spirometer at home, follow these instructions: [place] IF:   You are having difficultly using the spirometer.  You have trouble using the spirometer as often as instructed.  Your pain medication is not giving enough relief while using the spirometer.  You develop fever of 100.5 F (38.1 C) or higher. SEEK IMMEDIATE MEDICAL CARE IF:   You cough up bloody sputum that had not been present before.  You develop fever of 102 F (38.9 C) or greater.  You develop worsening pain at or near the incision site. MAKE SURE YOU:   Understand these instructions.  Will watch your condition.  Will get help right away if you are not doing well or get worse. Document Released: 08/14/2006 Document Revised: 06/26/2011 Document Reviewed: 10/15/2006 [facility] Patient Information 2014 [place], [place].   ________________________________________________________________________

## 2018-10-04 ENCOUNTER — Other Ambulatory Visit (HOSPITAL_COMMUNITY)
Admission: RE | Admit: 2018-10-04 | Discharge: 2018-10-04 | Disposition: A | Discharge status: Home / Self Care | Payer: Medicaid Other | Source: Ambulatory Visit | Attending: Orthopedic Surgery | Admitting: Orthopedic Surgery

## 2018-10-04 ENCOUNTER — Encounter (HOSPITAL_COMMUNITY): Payer: Self-pay

## 2018-10-04 ENCOUNTER — Other Ambulatory Visit: Payer: Self-pay

## 2018-10-04 ENCOUNTER — Encounter (HOSPITAL_COMMUNITY)
Admission: RE | Admit: 2018-10-04 | Discharge: 2018-10-04 | Disposition: A | Discharge status: Home / Self Care | Payer: Medicaid Other | Source: Ambulatory Visit | Attending: Orthopedic Surgery | Admitting: Orthopedic Surgery

## 2018-10-04 DIAGNOSIS — Z01812 Encounter for preprocedural laboratory examination: Secondary | ICD-10-CM | POA: Diagnosis not present

## 2018-10-04 DIAGNOSIS — Z1159 Encounter for screening for other viral diseases: Secondary | ICD-10-CM | POA: Diagnosis not present

## 2018-10-04 DIAGNOSIS — M1712 Unilateral primary osteoarthritis, left knee: Secondary | ICD-10-CM | POA: Insufficient documentation

## 2018-10-04 HISTORY — DX: Depression, unspecified: F32.A

## 2018-10-04 HISTORY — DX: Gastro-esophageal reflux disease without esophagitis: K21.9

## 2018-10-04 HISTORY — DX: Unspecified osteoarthritis, unspecified site: M19.90

## 2018-10-04 HISTORY — DX: Anxiety disorder, unspecified: F41.9

## 2018-10-04 HISTORY — DX: Chronic obstructive pulmonary disease, unspecified: J44.9

## 2018-10-04 LAB — SURGICAL PCR SCREEN
MRSA, PCR: NEGATIVE
Staphylococcus aureus: NEGATIVE

## 2018-10-04 LAB — CBC
HCT: 37.4 % (ref 36.0–46.0)
Hemoglobin: 11.7 g/dL — ABNORMAL LOW (ref 12.0–15.0)
MCH: 29 pg (ref 26.0–34.0)
MCHC: 31.3 g/dL (ref 30.0–36.0)
MCV: 92.8 fL (ref 80.0–100.0)
Platelets: 570 10*3/uL — ABNORMAL HIGH (ref 150–400)
RBC: 4.03 MIL/uL (ref 3.87–5.11)
RDW: 14.4 % (ref 11.5–15.5)
WBC: 10.6 10*3/uL — ABNORMAL HIGH (ref 4.0–10.5)
nRBC: 0 % (ref 0.0–0.2)

## 2018-10-04 LAB — SARS CORONAVIRUS 2 (TAT 6-24 HRS): SARS Coronavirus 2: NEGATIVE

## 2018-10-04 LAB — BASIC METABOLIC PANEL
Anion gap: 9 (ref 5–15)
BUN: 14 mg/dL (ref 6–20)
CO2: 25 mmol/L (ref 22–32)
Calcium: 8.8 mg/dL — ABNORMAL LOW (ref 8.9–10.3)
Chloride: 105 mmol/L (ref 98–111)
Creatinine, Ser: 1.04 mg/dL — ABNORMAL HIGH (ref 0.44–1.00)
GFR calc Af Amer: >60 mL/min (ref >60)
GFR calc non Af Amer: >60 mL/min (ref >60)
Glucose, Bld: 99 mg/dL (ref 70–99)
Potassium: 4.2 mmol/L (ref 3.5–5.1)
Sodium: 139 mmol/L (ref 135–145)

## 2018-10-08 ENCOUNTER — Ambulatory Visit (HOSPITAL_COMMUNITY): Payer: Medicaid Other | Admitting: Physician Assistant

## 2018-10-08 ENCOUNTER — Inpatient Hospital Stay (HOSPITAL_COMMUNITY): Payer: Medicaid Other

## 2018-10-08 ENCOUNTER — Inpatient Hospital Stay (HOSPITAL_COMMUNITY)
Admission: RE | Admit: 2018-10-08 | Discharge: 2018-10-09 | DRG: 470 | Disposition: A | Discharge status: Home / Self Care | Payer: Medicaid Other | Attending: Orthopedic Surgery | Admitting: Orthopedic Surgery

## 2018-10-08 ENCOUNTER — Other Ambulatory Visit: Payer: Self-pay

## 2018-10-08 ENCOUNTER — Ambulatory Visit (HOSPITAL_COMMUNITY): Payer: Medicaid Other | Admitting: Anesthesiology

## 2018-10-08 ENCOUNTER — Encounter (HOSPITAL_COMMUNITY): Payer: Self-pay | Admitting: Emergency Medicine

## 2018-10-08 ENCOUNTER — Encounter (HOSPITAL_COMMUNITY)
Admission: RE | Disposition: A | Discharge status: Home / Self Care | Payer: Self-pay | Source: Home / Self Care | Attending: Orthopedic Surgery

## 2018-10-08 DIAGNOSIS — Z803 Family history of malignant neoplasm of breast: Secondary | ICD-10-CM | POA: Diagnosis not present

## 2018-10-08 DIAGNOSIS — Z8249 Family history of ischemic heart disease and other diseases of the circulatory system: Secondary | ICD-10-CM

## 2018-10-08 DIAGNOSIS — G8929 Other chronic pain: Secondary | ICD-10-CM | POA: Diagnosis present

## 2018-10-08 DIAGNOSIS — Z825 Family history of asthma and other chronic lower respiratory diseases: Secondary | ICD-10-CM | POA: Diagnosis not present

## 2018-10-08 DIAGNOSIS — Z886 Allergy status to analgesic agent status: Secondary | ICD-10-CM | POA: Diagnosis not present

## 2018-10-08 DIAGNOSIS — F329 Major depressive disorder, single episode, unspecified: Secondary | ICD-10-CM | POA: Diagnosis present

## 2018-10-08 DIAGNOSIS — J449 Chronic obstructive pulmonary disease, unspecified: Secondary | ICD-10-CM | POA: Diagnosis present

## 2018-10-08 DIAGNOSIS — K219 Gastro-esophageal reflux disease without esophagitis: Secondary | ICD-10-CM | POA: Diagnosis present

## 2018-10-08 DIAGNOSIS — Z79899 Other long term (current) drug therapy: Secondary | ICD-10-CM

## 2018-10-08 DIAGNOSIS — Z91013 Allergy to seafood: Secondary | ICD-10-CM | POA: Diagnosis not present

## 2018-10-08 DIAGNOSIS — Z23 Encounter for immunization: Secondary | ICD-10-CM | POA: Diagnosis not present

## 2018-10-08 DIAGNOSIS — Z79891 Long term (current) use of opiate analgesic: Secondary | ICD-10-CM | POA: Diagnosis not present

## 2018-10-08 DIAGNOSIS — Z885 Allergy status to narcotic agent status: Secondary | ICD-10-CM | POA: Diagnosis not present

## 2018-10-08 DIAGNOSIS — M1712 Unilateral primary osteoarthritis, left knee: Secondary | ICD-10-CM | POA: Diagnosis present

## 2018-10-08 DIAGNOSIS — Z87891 Personal history of nicotine dependence: Secondary | ICD-10-CM | POA: Diagnosis not present

## 2018-10-08 DIAGNOSIS — F419 Anxiety disorder, unspecified: Secondary | ICD-10-CM | POA: Diagnosis present

## 2018-10-08 DIAGNOSIS — Z7951 Long term (current) use of inhaled steroids: Secondary | ICD-10-CM | POA: Diagnosis not present

## 2018-10-08 DIAGNOSIS — Z888 Allergy status to other drugs, medicaments and biological substances status: Secondary | ICD-10-CM | POA: Diagnosis not present

## 2018-10-08 DIAGNOSIS — Z7952 Long term (current) use of systemic steroids: Secondary | ICD-10-CM | POA: Diagnosis not present

## 2018-10-08 DIAGNOSIS — I10 Essential (primary) hypertension: Secondary | ICD-10-CM | POA: Diagnosis present

## 2018-10-08 DIAGNOSIS — M179 Osteoarthritis of knee, unspecified: Secondary | ICD-10-CM | POA: Diagnosis present

## 2018-10-08 HISTORY — PX: PATELLA-FEMORAL ARTHROPLASTY: SHX5037

## 2018-10-08 LAB — PREGNANCY, URINE: Preg Test, Ur: NEGATIVE

## 2018-10-08 SURGERY — ARTHROPLASTY, PATELLOFEMORAL
Anesthesia: Spinal | Laterality: Left

## 2018-10-08 MED ORDER — CEFAZOLIN SODIUM-DEXTROSE 2-4 GM/100ML-% IV SOLN
2.0000 g | Freq: Four times a day (QID) | INTRAVENOUS | Status: AC
  Administered 2018-10-08 (×2): 2 g via INTRAVENOUS
  Filled 2018-10-08 (×2): qty 100

## 2018-10-08 MED ORDER — DOCUSATE SODIUM 100 MG PO CAPS
100.0000 mg | ORAL_CAPSULE | Freq: Two times a day (BID) | ORAL | Status: DC
  Administered 2018-10-08 – 2018-10-09 (×2): 100 mg via ORAL
  Filled 2018-10-08 (×2): qty 1

## 2018-10-08 MED ORDER — MIRTAZAPINE 15 MG PO TABS
30.0000 mg | ORAL_TABLET | Freq: Every day | ORAL | Status: DC
  Administered 2018-10-08: 30 mg via ORAL
  Filled 2018-10-08: qty 2

## 2018-10-08 MED ORDER — DEXAMETHASONE SODIUM PHOSPHATE 10 MG/ML IJ SOLN
INTRAMUSCULAR | Status: DC | PRN
  Administered 2018-10-08: 10 mg via INTRAVENOUS

## 2018-10-08 MED ORDER — ONDANSETRON HCL 4 MG PO TABS
4.0000 mg | ORAL_TABLET | Freq: Four times a day (QID) | ORAL | Status: DC | PRN
  Filled 2018-10-08: qty 1

## 2018-10-08 MED ORDER — ALBUTEROL SULFATE (2.5 MG/3ML) 0.083% IN NEBU
3.0000 mL | INHALATION_SOLUTION | Freq: Two times a day (BID) | RESPIRATORY_TRACT | Status: DC
  Administered 2018-10-08 – 2018-10-09 (×2): 3 mL via RESPIRATORY_TRACT
  Filled 2018-10-08 (×2): qty 3

## 2018-10-08 MED ORDER — CHLORHEXIDINE GLUCONATE 4 % EX LIQD: 60.0000 mL | Freq: Once | CUTANEOUS | Status: DC

## 2018-10-08 MED ORDER — POTASSIUM CHLORIDE IN NACL 20-0.45 MEQ/L-% IV SOLN
INTRAVENOUS | Status: DC
  Administered 2018-10-08 – 2018-10-09 (×2): via INTRAVENOUS
  Filled 2018-10-08 (×2): qty 1000

## 2018-10-08 MED ORDER — BUPIVACAINE HCL 0.25 % IJ SOLN
INTRAMUSCULAR | Status: DC | PRN
  Administered 2018-10-08: 30 mL

## 2018-10-08 MED ORDER — VITAMIN D 25 MCG (1000 UNIT) PO TABS
2000.0000 [IU] | ORAL_TABLET | Freq: Every day | ORAL | Status: DC
  Administered 2018-10-08 – 2018-10-09 (×2): 2000 [IU] via ORAL
  Filled 2018-10-08 (×2): qty 2

## 2018-10-08 MED ORDER — ACETAMINOPHEN 325 MG PO TABS: 325.0000 mg | ORAL_TABLET | Freq: Four times a day (QID) | ORAL | Status: DC | PRN

## 2018-10-08 MED ORDER — TRANEXAMIC ACID-NACL 1000-0.7 MG/100ML-% IV SOLN
1000.0000 mg | INTRAVENOUS | Status: AC
  Administered 2018-10-08: 1000 mg via INTRAVENOUS
  Filled 2018-10-08: qty 100

## 2018-10-08 MED ORDER — BUPIVACAINE IN DEXTROSE 0.75-8.25 % IT SOLN
INTRATHECAL | Status: DC | PRN
  Administered 2018-10-08: 1.6 mL via INTRATHECAL

## 2018-10-08 MED ORDER — HYDROMORPHONE HCL 1 MG/ML IJ SOLN
0.5000 mg | INTRAMUSCULAR | Status: DC | PRN
  Administered 2018-10-08 – 2018-10-09 (×5): 1 mg via INTRAVENOUS
  Filled 2018-10-08 (×5): qty 1

## 2018-10-08 MED ORDER — CEFAZOLIN SODIUM-DEXTROSE 2-4 GM/100ML-% IV SOLN
2.0000 g | INTRAVENOUS | Status: AC
  Administered 2018-10-08: 2 g via INTRAVENOUS
  Filled 2018-10-08: qty 100

## 2018-10-08 MED ORDER — KETOROLAC TROMETHAMINE 30 MG/ML IJ SOLN
INTRAMUSCULAR | Status: AC
  Filled 2018-10-08: qty 1

## 2018-10-08 MED ORDER — AMLODIPINE BESYLATE 5 MG PO TABS
5.0000 mg | ORAL_TABLET | Freq: Every day | ORAL | Status: DC
  Administered 2018-10-09: 5 mg via ORAL
  Filled 2018-10-08: qty 1

## 2018-10-08 MED ORDER — HYDROCHLOROTHIAZIDE 12.5 MG PO CAPS
12.5000 mg | ORAL_CAPSULE | Freq: Every day | ORAL | Status: DC
  Administered 2018-10-08 – 2018-10-09 (×2): 12.5 mg via ORAL
  Filled 2018-10-08 (×2): qty 1

## 2018-10-08 MED ORDER — OXYCODONE HCL 5 MG PO TABS
5.0000 mg | ORAL_TABLET | ORAL | Status: DC | PRN
  Administered 2018-10-08 – 2018-10-09 (×2): 10 mg via ORAL
  Filled 2018-10-08 (×2): qty 2

## 2018-10-08 MED ORDER — PROPOFOL 500 MG/50ML IV EMUL
INTRAVENOUS | Status: DC | PRN
  Administered 2018-10-08: 50 ug/kg/min via INTRAVENOUS

## 2018-10-08 MED ORDER — METHOCARBAMOL 500 MG PO TABS
500.0000 mg | ORAL_TABLET | Freq: Four times a day (QID) | ORAL | Status: DC | PRN
  Administered 2018-10-08 – 2018-10-09 (×4): 500 mg via ORAL
  Filled 2018-10-08 (×4): qty 1

## 2018-10-08 MED ORDER — PROPOFOL 10 MG/ML IV BOLUS
INTRAVENOUS | Status: AC
  Filled 2018-10-08: qty 20

## 2018-10-08 MED ORDER — TRAZODONE HCL 150 MG PO TABS
150.0000 mg | ORAL_TABLET | Freq: Every evening | ORAL | Status: DC | PRN
  Filled 2018-10-08: qty 2

## 2018-10-08 MED ORDER — ZOLPIDEM TARTRATE 5 MG PO TABS: 5.0000 mg | ORAL_TABLET | Freq: Every evening | ORAL | Status: DC | PRN

## 2018-10-08 MED ORDER — MENTHOL 3 MG MT LOZG: 1.0000 | LOZENGE | OROMUCOSAL | Status: DC | PRN

## 2018-10-08 MED ORDER — OXYCODONE HCL 5 MG PO TABS: 5.0000 mg | ORAL_TABLET | ORAL | 0 refills | Status: DC | PRN

## 2018-10-08 MED ORDER — MIDAZOLAM HCL 2 MG/2ML IJ SOLN: 1.0000 mg | INTRAMUSCULAR | Status: DC

## 2018-10-08 MED ORDER — PHENOL 1.4 % MT LIQD
1.0000 | OROMUCOSAL | Status: DC | PRN
  Filled 2018-10-08: qty 177

## 2018-10-08 MED ORDER — PANTOPRAZOLE SODIUM 40 MG PO TBEC
40.0000 mg | DELAYED_RELEASE_TABLET | Freq: Every day | ORAL | Status: DC
  Administered 2018-10-08 – 2018-10-09 (×2): 40 mg via ORAL
  Filled 2018-10-08 (×2): qty 1

## 2018-10-08 MED ORDER — POLYETHYLENE GLYCOL 3350 17 G PO PACK: 17.0000 g | PACK | Freq: Every day | ORAL | Status: DC | PRN

## 2018-10-08 MED ORDER — HYDROMORPHONE HCL 1 MG/ML IJ SOLN: 0.2500 mg | INTRAMUSCULAR | Status: DC | PRN

## 2018-10-08 MED ORDER — METOCLOPRAMIDE HCL 5 MG/ML IJ SOLN: 5.0000 mg | Freq: Three times a day (TID) | INTRAMUSCULAR | Status: DC | PRN

## 2018-10-08 MED ORDER — DEXAMETHASONE SODIUM PHOSPHATE 10 MG/ML IJ SOLN
INTRAMUSCULAR | Status: AC
  Filled 2018-10-08: qty 1

## 2018-10-08 MED ORDER — ALUM & MAG HYDROXIDE-SIMETH 200-200-20 MG/5ML PO SUSP: 30.0000 mL | ORAL | Status: DC | PRN

## 2018-10-08 MED ORDER — PROPOFOL 500 MG/50ML IV EMUL
INTRAVENOUS | Status: DC | PRN
  Administered 2018-10-08 (×2): 20 mg via INTRAVENOUS

## 2018-10-08 MED ORDER — LORATADINE 10 MG PO TABS
10.0000 mg | ORAL_TABLET | Freq: Every day | ORAL | Status: DC
  Administered 2018-10-09: 10 mg via ORAL
  Filled 2018-10-08: qty 1

## 2018-10-08 MED ORDER — POVIDONE-IODINE 10 % EX SWAB: 2.0000 "application " | Freq: Once | CUTANEOUS | Status: DC

## 2018-10-08 MED ORDER — DIAZEPAM 5 MG PO TABS
5.0000 mg | ORAL_TABLET | Freq: Four times a day (QID) | ORAL | Status: DC | PRN
  Administered 2018-10-08 – 2018-10-09 (×2): 5 mg via ORAL
  Filled 2018-10-08 (×2): qty 1

## 2018-10-08 MED ORDER — SENNA-DOCUSATE SODIUM 8.6-50 MG PO TABS: 2.0000 | ORAL_TABLET | Freq: Every day | ORAL | 1 refills | Status: DC

## 2018-10-08 MED ORDER — BENZONATATE 100 MG PO CAPS
200.0000 mg | ORAL_CAPSULE | Freq: Three times a day (TID) | ORAL | Status: DC
  Administered 2018-10-08 – 2018-10-09 (×4): 200 mg via ORAL
  Filled 2018-10-08 (×4): qty 2

## 2018-10-08 MED ORDER — PROMETHAZINE HCL 25 MG/ML IJ SOLN: 6.2500 mg | INTRAMUSCULAR | Status: DC | PRN

## 2018-10-08 MED ORDER — TRANEXAMIC ACID-NACL 1000-0.7 MG/100ML-% IV SOLN
1000.0000 mg | Freq: Once | INTRAVENOUS | Status: AC
  Administered 2018-10-08: 1000 mg via INTRAVENOUS
  Filled 2018-10-08: qty 100

## 2018-10-08 MED ORDER — ASPIRIN EC 325 MG PO TBEC: 325.0000 mg | DELAYED_RELEASE_TABLET | Freq: Two times a day (BID) | ORAL | 0 refills | Status: DC

## 2018-10-08 MED ORDER — MAGNESIUM CITRATE PO SOLN: 1.0000 | Freq: Once | ORAL | Status: DC | PRN

## 2018-10-08 MED ORDER — CITALOPRAM HYDROBROMIDE 20 MG PO TABS
20.0000 mg | ORAL_TABLET | Freq: Every day | ORAL | Status: DC
  Administered 2018-10-09: 20 mg via ORAL
  Filled 2018-10-08: qty 1

## 2018-10-08 MED ORDER — FAMOTIDINE 20 MG PO TABS
20.0000 mg | ORAL_TABLET | Freq: Two times a day (BID) | ORAL | Status: DC
  Administered 2018-10-08 – 2018-10-09 (×3): 20 mg via ORAL
  Filled 2018-10-08 (×3): qty 1

## 2018-10-08 MED ORDER — ALBUTEROL SULFATE (2.5 MG/3ML) 0.083% IN NEBU: 2.5000 mg | INHALATION_SOLUTION | Freq: Four times a day (QID) | RESPIRATORY_TRACT | Status: DC | PRN

## 2018-10-08 MED ORDER — PROPOFOL 10 MG/ML IV BOLUS
INTRAVENOUS | Status: AC
  Filled 2018-10-08: qty 40

## 2018-10-08 MED ORDER — METOCLOPRAMIDE HCL 5 MG PO TABS: 5.0000 mg | ORAL_TABLET | Freq: Three times a day (TID) | ORAL | Status: DC | PRN

## 2018-10-08 MED ORDER — DIPHENHYDRAMINE HCL 12.5 MG/5ML PO ELIX: 12.5000 mg | ORAL_SOLUTION | ORAL | Status: DC | PRN

## 2018-10-08 MED ORDER — BISACODYL 10 MG RE SUPP: 10.0000 mg | Freq: Every day | RECTAL | Status: DC | PRN

## 2018-10-08 MED ORDER — ACETAMINOPHEN 500 MG PO TABS
1000.0000 mg | ORAL_TABLET | Freq: Four times a day (QID) | ORAL | Status: AC
  Administered 2018-10-08 – 2018-10-09 (×4): 1000 mg via ORAL
  Filled 2018-10-08 (×4): qty 2

## 2018-10-08 MED ORDER — ONDANSETRON HCL 4 MG/2ML IJ SOLN
INTRAMUSCULAR | Status: DC | PRN
  Administered 2018-10-08: 4 mg via INTRAVENOUS

## 2018-10-08 MED ORDER — DEXAMETHASONE SODIUM PHOSPHATE 10 MG/ML IJ SOLN
10.0000 mg | Freq: Once | INTRAMUSCULAR | Status: AC
  Administered 2018-10-09: 10 mg via INTRAVENOUS
  Filled 2018-10-08: qty 1

## 2018-10-08 MED ORDER — BUPIVACAINE HCL (PF) 0.25 % IJ SOLN
INTRAMUSCULAR | Status: AC
  Filled 2018-10-08: qty 30

## 2018-10-08 MED ORDER — ONDANSETRON HCL 4 MG/2ML IJ SOLN: 4.0000 mg | Freq: Four times a day (QID) | INTRAMUSCULAR | Status: DC | PRN

## 2018-10-08 MED ORDER — FLUTICASONE PROPIONATE 50 MCG/ACT NA SUSP
1.0000 | Freq: Every day | NASAL | Status: DC
  Administered 2018-10-09: 1 via NASAL
  Filled 2018-10-08: qty 16

## 2018-10-08 MED ORDER — SODIUM CHLORIDE 0.9 % IR SOLN
Status: DC | PRN
  Administered 2018-10-08: 1000 mL

## 2018-10-08 MED ORDER — ACETAMINOPHEN 500 MG PO TABS
1000.0000 mg | ORAL_TABLET | Freq: Once | ORAL | Status: AC
  Administered 2018-10-08: 09:00:00 1000 mg via ORAL
  Filled 2018-10-08: qty 2

## 2018-10-08 MED ORDER — OXYCODONE HCL 5 MG PO TABS
10.0000 mg | ORAL_TABLET | ORAL | Status: DC | PRN
  Administered 2018-10-09 (×2): 15 mg via ORAL
  Administered 2018-10-09: 03:00:00 10 mg via ORAL
  Filled 2018-10-08: qty 2
  Filled 2018-10-08 (×2): qty 3

## 2018-10-08 MED ORDER — HYDROMORPHONE HCL 1 MG/ML IJ SOLN
0.5000 mg | INTRAMUSCULAR | Status: DC | PRN
  Administered 2018-10-08: 1 mg via INTRAVENOUS
  Filled 2018-10-08: qty 1

## 2018-10-08 MED ORDER — PNEUMOCOCCAL VAC POLYVALENT 25 MCG/0.5ML IJ INJ
0.5000 mL | INJECTION | INTRAMUSCULAR | Status: AC
  Administered 2018-10-09: 0.5 mL via INTRAMUSCULAR
  Filled 2018-10-08: qty 0.5

## 2018-10-08 MED ORDER — LACTATED RINGERS IV SOLN
INTRAVENOUS | Status: DC
  Administered 2018-10-08 (×2): via INTRAVENOUS

## 2018-10-08 MED ORDER — WATER FOR IRRIGATION, STERILE IR SOLN
Status: DC | PRN
  Administered 2018-10-08: 2000 mL

## 2018-10-08 MED ORDER — ROPIVACAINE HCL 7.5 MG/ML IJ SOLN
INTRAMUSCULAR | Status: DC | PRN
  Administered 2018-10-08: 20 mL via PERINEURAL

## 2018-10-08 MED ORDER — FENTANYL CITRATE (PF) 100 MCG/2ML IJ SOLN
50.0000 ug | INTRAMUSCULAR | Status: DC
  Administered 2018-10-08: 75 ug via INTRAVENOUS
  Filled 2018-10-08: qty 2

## 2018-10-08 MED ORDER — GABAPENTIN 300 MG PO CAPS
300.0000 mg | ORAL_CAPSULE | Freq: Three times a day (TID) | ORAL | Status: DC
  Administered 2018-10-08 – 2018-10-09 (×4): 300 mg via ORAL
  Filled 2018-10-08 (×4): qty 1

## 2018-10-08 MED ORDER — METHOCARBAMOL 750 MG PO TABS: 750.0000 mg | ORAL_TABLET | Freq: Three times a day (TID) | ORAL | 0 refills | Status: DC | PRN

## 2018-10-08 MED ORDER — ASPIRIN EC 325 MG PO TBEC
325.0000 mg | DELAYED_RELEASE_TABLET | Freq: Two times a day (BID) | ORAL | Status: DC
  Administered 2018-10-08 – 2018-10-09 (×2): 325 mg via ORAL
  Filled 2018-10-08 (×2): qty 1

## 2018-10-08 MED ORDER — ONDANSETRON HCL 4 MG PO TABS: 4.0000 mg | ORAL_TABLET | Freq: Three times a day (TID) | ORAL | 0 refills | Status: DC | PRN

## 2018-10-08 MED ORDER — CLONIDINE HCL (ANALGESIA) 100 MCG/ML EP SOLN
EPIDURAL | Status: DC | PRN
  Administered 2018-10-08: 70 ug

## 2018-10-08 MED ORDER — POTASSIUM CHLORIDE CRYS ER 20 MEQ PO TBCR
20.0000 meq | EXTENDED_RELEASE_TABLET | Freq: Two times a day (BID) | ORAL | Status: DC
  Administered 2018-10-08 – 2018-10-09 (×3): 20 meq via ORAL
  Filled 2018-10-08 (×3): qty 1

## 2018-10-08 MED ORDER — METHOCARBAMOL 500 MG IVPB - SIMPLE MED
500.0000 mg | Freq: Four times a day (QID) | INTRAVENOUS | Status: DC | PRN
  Filled 2018-10-08: qty 50

## 2018-10-08 SURGICAL SUPPLY — 62 items
APL PRP STRL LF DISP 70% ISPRP (MISCELLANEOUS) ×2
BAG SPEC THK2 15X12 ZIP CLS (MISCELLANEOUS)
BAG ZIPLOCK 12X15 (MISCELLANEOUS) IMPLANT
BLADE SAG 18X100X1.27 (BLADE) ×3 IMPLANT
BLADE SURG 15 STRL LF DISP TIS (BLADE) ×1 IMPLANT
BLADE SURG 15 STRL SS (BLADE) ×2
BLADE SURG SZ10 CARB STEEL (BLADE) ×4 IMPLANT
BNDG CMPR MED 10X6 ELC LF (GAUZE/BANDAGES/DRESSINGS) ×1
BNDG CMPR MED 15X6 ELC VLCR LF (GAUZE/BANDAGES/DRESSINGS) ×1
BNDG ELASTIC 6X10 VLCR STRL LF (GAUZE/BANDAGES/DRESSINGS) ×1 IMPLANT
BNDG ELASTIC 6X15 VLCR STRL LF (GAUZE/BANDAGES/DRESSINGS) ×2 IMPLANT
BOWL SMART MIX CTS (DISPOSABLE) ×2 IMPLANT
BUR SURG PFJ MILL NEXGEN (Knees) IMPLANT
BURR SURG PFJ MILL NEXGEN (Knees) ×2 IMPLANT
CHLORAPREP W/TINT 26 (MISCELLANEOUS) ×2 IMPLANT
CLSR STERI-STRIP ANTIMIC 1/2X4 (GAUZE/BANDAGES/DRESSINGS) ×3 IMPLANT
COMP FEM NEXGEN SZ1 +3.5 LT (Knees) ×2 IMPLANT
COMPONENT FEM NEXGN SZ1 +3.5LT (Knees) IMPLANT
COVER MAYO STAND STRL (DRAPES) ×1 IMPLANT
COVER SURGICAL LIGHT HANDLE (MISCELLANEOUS) ×2 IMPLANT
COVER WAND RF STERILE (DRAPES) IMPLANT
CUFF TOURN SGL QUICK 34 (TOURNIQUET CUFF) ×2
CUFF TRNQT CYL 34X4.125X (TOURNIQUET CUFF) ×1 IMPLANT
DECANTER SPIKE VIAL GLASS SM (MISCELLANEOUS) ×1 IMPLANT
DRAPE U-SHAPE 47X51 STRL (DRAPES) ×2 IMPLANT
DRSG MEPILEX BORDER 4X8 (GAUZE/BANDAGES/DRESSINGS) ×2 IMPLANT
DRSG PAD ABDOMINAL 8X10 ST (GAUZE/BANDAGES/DRESSINGS) ×2 IMPLANT
DURAPREP 26ML APPLICATOR (WOUND CARE) ×2 IMPLANT
ELECT REM PT RETURN 15FT ADLT (MISCELLANEOUS) ×2 IMPLANT
FACESHIELD WRAPAROUND (MASK) ×2 IMPLANT
FACESHIELD WRAPAROUND OR TEAM (MASK) IMPLANT
GLOVE BIO SURGEON STRL SZ7.5 (GLOVE) ×2 IMPLANT
GLOVE BIO SURGEON STRL SZ8 (GLOVE) ×2 IMPLANT
GLOVE BIOGEL PI IND STRL 8 (GLOVE) ×2 IMPLANT
GLOVE BIOGEL PI INDICATOR 8 (GLOVE) ×2
GOWN STRL REUS W/TWL 2XL LVL3 (GOWN DISPOSABLE) ×2 IMPLANT
GOWN STRL REUS W/TWL LRG LVL3 (GOWN DISPOSABLE) ×2 IMPLANT
HANDPIECE INTERPULSE COAX TIP (DISPOSABLE) ×2
HOLDER FOLEY CATH W/STRAP (MISCELLANEOUS) ×1 IMPLANT
HOOD PEEL AWAY FLYTE STAYCOOL (MISCELLANEOUS) ×6 IMPLANT
IMMOBILIZER KNEE 20 (SOFTGOODS) ×2
IMMOBILIZER KNEE 20 THIGH 36 (SOFTGOODS) ×1 IMPLANT
KIT TURNOVER KIT A (KITS) IMPLANT
MANIFOLD NEPTUNE II (INSTRUMENTS) ×2 IMPLANT
NS IRRIG 1000ML POUR BTL (IV SOLUTION) ×2 IMPLANT
PACK ICE MAXI GEL EZY WRAP (MISCELLANEOUS) ×2 IMPLANT
PACK TOTAL KNEE CUSTOM (KITS) ×2 IMPLANT
PROTECTOR NERVE ULNAR (MISCELLANEOUS) ×2 IMPLANT
SCREW HEADED 33MM KNEE (MISCELLANEOUS) ×3 IMPLANT
SCREW HEADED 48MM KNEE (MISCELLANEOUS) ×2 IMPLANT
SET HNDPC FAN SPRY TIP SCT (DISPOSABLE) ×1 IMPLANT
STEM POLY PAT PLY 29M KNEE (Knees) ×1 IMPLANT
SUT VIC AB 0 CT1 36 (SUTURE) ×2 IMPLANT
SUT VIC AB 2-0 CT1 27 (SUTURE) ×2
SUT VIC AB 2-0 CT1 TAPERPNT 27 (SUTURE) ×1 IMPLANT
SUT VIC AB 3-0 SH 8-18 (SUTURE) ×2 IMPLANT
TRAY FOLEY MTR SLVR 14FR STAT (SET/KITS/TRAYS/PACK) ×1 IMPLANT
TRAY FOLEY MTR SLVR 16FR STAT (SET/KITS/TRAYS/PACK) ×2 IMPLANT
WATER STERILE IRR 1000ML POUR (IV SOLUTION) ×4 IMPLANT
WIPE CHG CHLORHEXIDINE 2% (PERSONAL CARE ITEMS) ×1 IMPLANT
WRAP KNEE MAXI GEL POST OP (GAUZE/BANDAGES/DRESSINGS) ×1 IMPLANT
YANKAUER SUCT BULB TIP 10FT TU (MISCELLANEOUS) ×1 IMPLANT

## 2018-10-08 NOTE — Discharge Instructions (Signed)

## 2018-10-08 NOTE — H&P (Signed)
PREOPERATIVE H&P  Chief Complaint: Left knee pain  HPI: Kathryn [name] is a 48 y.o. female who presents for preoperative history and physical with a diagnosis of left knee patellofemoral osteoarthritis. Symptoms are rated as moderate to severe, and have been worsening.  This is significantly impairing activities of daily living.  She has elected for surgical management.  She has had multiple recurrent effusions with severe pain, difficulty with ambulation, coming in almost weekly miserable in tears in the office.  She has failed injections, activity modification, anti-inflammatories, and assistive devices.  Preoperative  x-rays are fairly benign, although MRI demonstrates full-thickness chondral loss on the lateral facet of the patella with associated subchondral cystic formation, the lateral side is totally normal, the medial side has some mild chondral thinning.    Past Medical History:  Diagnosis Date  . Anxiety   . Arthritis   . Asthma   . Chronic back pain   . COPD (chronic obstructive pulmonary disease) (HCC)    Chronic bronchitis  . Depression   . GERD (gastroesophageal reflux disease)   . Hypertension   . Neuropathy   . Osteoarthritis of left knee, 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   Past Surgical History:  Procedure Laterality Date  . TUBAL LIGATION     Social History   Socioeconomic History  . Marital status: Single    Spouse name: Not on file  . Number of children: Not on file  . Years of education: Not on file  . Highest education level: Not on file  Occupational History  . Not on file  Social Needs  . Financial resource strain: Not on file  . Food insecurity    Worry: Not on file    Inability: Not on file  . Transportation needs    Medical: Not on file    Non-medical: Not on file  Tobacco Use  . Smoking status: Former Smoker    Types: Cigarettes    Quit date: 2016    Years since quitting: 4.4  . Smokeless tobacco: Never Used  . Tobacco comment: wears nicotine  patches  Substance and Sexual Activity  . Alcohol use: Yes    Comment: occ  . Drug use: Not Currently    Types: Cocaine    Comment: crack  last used 2016  . Sexual activity: Yes    Birth control/protection: Surgical    Comment: tubal  Lifestyle  . Physical activity    Days per week: Not on file    Minutes per session: Not on file  . Stress: Not on file  Relationships  . Social Herbalist on phone: Not on file    Gets together: Not on file    Attends religious service: Not on file    Active member of club or organization: Not on file    Attends meetings of clubs or organizations: Not on file    Relationship status: Not on file  Other Topics Concern  . Not on file  Social History Narrative  . Not on file   Family History  Problem Relation Age of Onset  . Gout Paternal Grandfather   . Cirrhosis Paternal Grandfather   . Hypertension Paternal Grandmother   . Aneurysm Paternal Grandmother   . Cirrhosis Maternal Grandmother   . Cirrhosis Maternal Grandfather   . Cancer Father   . Cirrhosis Father   . Cirrhosis Mother   . Breast cancer Sister   . Hypertension Sister   . Bronchitis Daughter   . Bronchitis Daughter   .  Asthma Son   . Bronchitis Son   . Migraines Neg Hx    Allergies  Allergen Reactions  . Clonopin [Clonazepam] Anaphylaxis    Swelling of the tongue and mouth.   . Fish Allergy Anaphylaxis, Shortness Of Breath and Swelling  . Flexeril [Cyclobenzaprine Hcl] Shortness Of Breath  . Ibuprofen Anaphylaxis and Hives  . Shellfish Allergy Anaphylaxis  . Ace Inhibitors Cough  . Tramadol Nausea And Vomiting    Upset stomach   Prior to Admission medications   Medication Sig Start Date End Date Taking? Authorizing Provider  acetaminophen-codeine (TYLENOL #3) 300-30 MG tablet Take 1 tablet by mouth every 12 (twelve) hours as needed for severe pain.    Yes [provider]  albuterol (PROVENTIL HFA;VENTOLIN HFA) 108 (90 Base) MCG/ACT inhaler Inhale  2 puffs into the lungs 2 (two) times daily.   Yes [provider]  albuterol (PROVENTIL) (2.5 MG/3ML) 0.083% nebulizer solution Take 2.5 mg by nebulization every 6 (six) hours as needed for wheezing or shortness of breath.   Yes [provider]  amLODipine (NORVASC) 5 MG tablet Take 5 mg by mouth daily.   Yes [provider]  benzonatate (TESSALON) 200 MG capsule Take 1 capsule (200 mg total) by mouth 3 (three) times daily as needed for cough. Swallow whole, do not chew Patient taking differently: Take 200 mg by mouth 3 (three) times daily. Swallow whole, do not chew 09/18/18  Yes Triplett, Tammy, PA-C  cholecalciferol (VITAMIN D3) 25 MCG (1000 UT) tablet Take 2,000 Units by mouth daily.   Yes [provider]  citalopram (CELEXA) 20 MG tablet Take 20 mg by mouth daily.   Yes [provider]  famotidine (PEPCID) 20 MG tablet Take 1 tablet (20 mg total) by mouth 2 (two) times daily. 02/06/18  Yes Virgel Manifold, MD  fluticasone [facility]) 50 MCG/ACT nasal spray Place 1 spray into both nostrils daily.   Yes [provider]  gabapentin (NEURONTIN) 300 MG capsule Take 1 capsule (300 mg total) by mouth 3 (three) times daily. 12/28/17  Yes Starkes-Perry, Gayland Curry, FNP  hydrochlorothiazide (MICROZIDE) 12.5 MG capsule Take 12.5 mg by mouth daily.   Yes [provider]  loratadine (CLARITIN) 10 MG tablet Take 10 mg by mouth daily.    Yes [provider]  methocarbamol (ROBAXIN) 750 MG tablet Take 750 mg by mouth 3 (three) times daily as needed for muscle spasms.    Yes [provider]  methylPREDNISolone (MEDROL DOSEPAK) 4 MG TBPK tablet Take 4 mg by mouth daily.   Yes [provider]  mirtazapine (REMERON) 30 MG tablet Take 30 mg by mouth at bedtime.   Yes [provider]  omeprazole (PRILOSEC) 20 MG capsule Take 1 po BID x 2 weeks then once a day Patient taking differently: Take 20 mg by mouth daily.  03/24/18  Yes  Rolland Porter, MD  potassium chloride SA (K-DUR,KLOR-CON) 20 MEQ tablet Take 1 tablet (20 mEq total) by mouth 2 (two) times daily. 04/19/18  Yes Idol, Almyra Free, PA-C  traZODone (DESYREL) 150 MG tablet Take 150-300 mg by mouth at bedtime as needed for sleep.   Yes [provider]  cephALEXin (KEFLEX) 500 MG capsule Take 1 capsule (500 mg total) by mouth 4 (four) times daily. Patient not taking: Reported on 10/01/2018 09/18/18   Triplett, Tammy, PA-C     Positive ROS: All other systems have been reviewed and were otherwise negative with the exception of those mentioned in the HPI  and as above.  Physical Exam: General: Alert, no acute distress Cardiovascular: No pedal edema Respiratory: No cyanosis, no use of accessory musculature GI: No organomegaly, abdomen is soft and non-tender Skin: No lesions in the area of chief complaint Neurologic: Sensation intact distally Psychiatric: Patient is competent for consent with normal mood and affect Lymphatic: No axillary or cervical lymphadenopathy  MUSCULOSKELETAL: Left knee has a positive effusion, patellar crepitance, range of motion 0 to 90 degrees.  Assessment: Left knee patellofemoral osteoarthritis   Plan: Plan for Procedure(s): Left knee patellofemoral arthroplasty  The risks benefits and alternatives were discussed with the patient including but not limited to the risks of nonoperative treatment, versus surgical intervention including infection, bleeding, nerve injury,  blood clots, cardiopulmonary complications, morbidity, mortality, among others, and they were willing to proceed.    Patient's anticipated LOS is less than 2 midnights, meeting these requirements: - Younger than 2 - Lives within 1 hour of care - Has a competent adult at home to recover with post-op recover - NO history of  - Chronic pain requiring opiods  - Diabetes  - Coronary Artery Disease  - Heart failure  - Heart attack  - Stroke  - DVT/VTE  - Cardiac  arrhythmia  - Respiratory Failure/COPD  - Renal failure  - Anemia  - Advanced Liver disease        Johnny Bridge, MD Cell [phone]   10/08/2018 9:45 AM

## 2018-10-08 NOTE — Anesthesia Procedure Notes (Signed)
Anesthesia Regional Block: Adductor canal block   Pre-Anesthetic Checklist: ,, timeout performed, Correct Patient, Correct Site, Correct Laterality, Correct Procedure, Correct Position, site marked, Risks and benefits discussed,  Surgical consent,  Pre-op evaluation,  At surgeon's request and post-op pain management  Laterality: Left  Prep: chloraprep       Needles:  Injection technique: Single-shot  Needle Type: Echogenic Needle     Needle Length: 9cm      Additional Needles:   Procedures:,,,, ultrasound used (permanent image in chart),,,,  Narrative:  Start time: 10/08/2018 9:48 AM End time: 10/08/2018 9:55 AM Injection made incrementally with aspirations every 5 mL.  Performed by: Personally  Anesthesiologist: Myrtie Soman, MD  Additional Notes: Patient tolerated the procedure well without complications

## 2018-10-08 NOTE — Anesthesia Preprocedure Evaluation (Addendum)
Anesthesia Evaluation  Patient identified by MRN, date of birth, ID band Patient awake    Reviewed: Allergy & Precautions, NPO status , Patient's Chart, lab work & pertinent test results  Airway Mallampati: II  TM Distance: >3 FB Neck ROM: Full    Dental no notable dental hx.    Pulmonary COPD, former smoker,    Pulmonary exam normal breath sounds clear to auscultation       Cardiovascular hypertension, Normal cardiovascular exam Rhythm:Regular Rate:Normal     Neuro/Psych negative neurological ROS  negative psych ROS   GI/Hepatic Neg liver ROS, GERD  ,  Endo/Other  Morbid obesity  Renal/GU negative Renal ROS  negative genitourinary   Musculoskeletal negative musculoskeletal ROS (+)   Abdominal   Peds negative pediatric ROS (+)  Hematology negative hematology ROS (+)   Anesthesia Other Findings   Reproductive/Obstetrics negative OB ROS                             Anesthesia Physical Anesthesia Plan  ASA: III  Anesthesia Plan: Spinal   Post-op Pain Management:  Regional for Post-op pain   Induction: Intravenous  PONV Risk Score and Plan: 2 and Ondansetron and Dexamethasone  Airway Management Planned: Simple Face Mask  Additional Equipment:   Intra-op Plan:   Post-operative Plan:   Informed Consent: I have reviewed the patients History and Physical, chart, labs and discussed the procedure including the risks, benefits and alternatives for the proposed anesthesia with the patient or authorized representative who has indicated his/her understanding and acceptance.     Dental advisory given  Plan Discussed with: CRNA and Surgeon  Anesthesia Plan Comments:         Anesthesia Quick Evaluation

## 2018-10-08 NOTE — Anesthesia Procedure Notes (Signed)
Anesthesia Procedure Image    

## 2018-10-08 NOTE — Anesthesia Postprocedure Evaluation (Signed)
Anesthesia Post Note  Patient: Kathryn [name]  Procedure(s) Performed: PATELLA-FEMORAL ARTHROPLASTY (Left )     Patient location during evaluation: PACU Anesthesia Type: Spinal Level of consciousness: oriented and awake and alert Pain management: pain level controlled Vital Signs Assessment: post-procedure vital signs reviewed and stable Respiratory status: spontaneous breathing, respiratory function stable and patient connected to nasal cannula oxygen Cardiovascular status: blood pressure returned to baseline and stable Postop Assessment: no headache, no backache and no apparent nausea or vomiting Anesthetic complications: no    Last Vitals:  Vitals:   10/08/18 1315 10/08/18 1330  BP: 111/79 107/73  Pulse: 63 64  Resp: 10 13  Temp:  (!) 36.3 C  SpO2: 100% 100%    Last Pain:  Vitals:   10/08/18 1315  TempSrc:   PainSc: 0-No pain                 Jayna Mulnix S

## 2018-10-08 NOTE — Evaluation (Signed)
Physical Therapy Evaluation Patient Details Name: Kathryn [name] MRN: [id number] DOB: 1970-10-04 Today's Date: 10/08/2018   History of Present Illness  48 yo female s/p L patellofemoral arthroplasty on 10/08/18. PMH includes anxiety, OA, COPD, depression, GERD, neuropathy.  Clinical Impression   Pt presents with severe L knee pain, decreased L knee ROM, difficulty performing mobility tasks, unsteadiness in standing, and decreased activity tolerance due to pain. Pt to benefit from acute PT to address deficits. Pt ambulated room distance with RW with min guard assist, L KI applied due to post-operative weakness for pt safety. Pt educated on ankle pumps (20/hour) to perform this afternoon/evening to increase circulation, to pt's tolerance and limited by pain. PT to progress mobility as tolerated, and will continue to follow acutely.        Follow Up Recommendations Follow surgeon's recommendation for DC plan and follow-up therapies;Supervision for mobility/OOB    Equipment Recommendations  Rolling walker with 5" wheels(youth RW, pt is 4'11")    Recommendations for Other Services       Precautions / Restrictions Precautions Precautions: Fall Required Braces or Orthoses: Knee Immobilizer - Left Knee Immobilizer - Left: Other (comment)(KI used due to post-surgical LLE weakness) Restrictions Weight Bearing Restrictions: No Other Position/Activity Restrictions: WBAT      Mobility  Bed Mobility Overal bed mobility: Needs Assistance Bed Mobility: Supine to Sit     Supine to sit: Min assist;HOB elevated;+2 for safety/equipment     General bed mobility comments: Min assist for LLE management, trunk elevation. Increased time and effort. Pt dizzy upon sitting EOB, BP and HR 154/90 and 94 bpm respectively.  Transfers Overall transfer level: Needs assistance Equipment used: Rolling walker (2 wheeled) Transfers: Sit to/from Stand Sit to Stand: Min assist;From elevated surface;+2  safety/equipment         General transfer comment: Min assist for power up, steadying upon standing. Verbal cuing for hand placement when rising.  Ambulation/Gait Ambulation/Gait assistance: Min guard;+2 safety/equipment Gait Distance (Feet): 10 Feet Assistive device: Rolling walker (2 wheeled) Gait Pattern/deviations: Step-to pattern;Decreased step length - left;Decreased step length - right;Antalgic;Decreased weight shift to left Gait velocity: decr   General Gait Details: Min guard for safety, verbal cuing for sequencing, placement in RW, and posture.  Stairs            Wheelchair Mobility    Modified Rankin (Stroke Patients Only)       Balance Overall balance assessment: Needs assistance Sitting-balance support: No upper extremity supported;Feet supported Sitting balance-Leahy Scale: Good     Standing balance support: Bilateral upper extremity supported Standing balance-Leahy Scale: Poor Standing balance comment: reliant on RW for steadying                             Pertinent Vitals/Pain Pain Assessment: 0-10 Pain Score: 10-Worst pain ever Pain Location: L knee Pain Descriptors / Indicators: Sore;Sharp Pain Intervention(s): Limited activity within patient's tolerance;Monitored during session;Premedicated before session;Repositioned;Ice applied;RN gave pain meds during session    [place] expects to be discharged to:: Private residence Living Arrangements: Children(Pt has custody of 2 of her grandchildren, ages 81 and 17) Available Help at Discharge: Family;Friend(s);Available PRN/intermittently(daughter, son, boyfriend) Type of Home: House Home Access: Stairs to enter Entrance Stairs-Rails: Right Entrance Stairs-Number of Steps: 3 Home Layout: One level Home Equipment: Cane - single point      Prior Function Level of Independence: Needs assistance   Gait / Transfers Assistance Needed: pt  using cane PTA  ADL's /  Homemaking Assistance Needed: Pt states she has an aide that assists her with bathing, dressing, cooking, and cleaning        Hand Dominance   Dominant Hand: Right    Extremity/Trunk Assessment   Upper Extremity Assessment Upper Extremity Assessment: Overall WFL for tasks assessed    Lower Extremity Assessment Lower Extremity Assessment: Generalized weakness;LLE deficits/detail LLE Deficits / Details: suspected post-surgical weakness; able to perform ankle pumps, heel slide to 20* limited by pain with crying, quad set. Pt unable to perform SLR. LLE Sensation: WNL    Cervical / Trunk Assessment Cervical / Trunk Assessment: Normal  Communication   Communication: No difficulties  Cognition Arousal/Alertness: Awake/alert Behavior During Therapy: WFL for tasks assessed/performed Overall Cognitive Status: Within Functional Limits for tasks assessed                                 General Comments: Pt very pleasant      General Comments      Exercises     Assessment/Plan    PT Assessment Patient needs continued PT services  PT Problem List Decreased strength;Decreased mobility;Decreased range of motion;Decreased coordination;Decreased knowledge of precautions;Decreased activity tolerance;Decreased balance;Decreased knowledge of use of DME       PT Treatment Interventions DME instruction;Therapeutic activities;Gait training;Therapeutic exercise;Patient/family education;Stair training;Balance training;Functional mobility training    PT Goals (Current goals can be found in the Care Plan section)  Acute Rehab PT Goals Patient Stated Goal: decrease L knee pain PT Goal Formulation: With patient Time For Goal Achievement: 10/15/18 Potential to Achieve Goals: Good    Frequency 7X/week   Barriers to discharge        Co-evaluation               AM-PAC PT "6 Clicks" Mobility  Outcome Measure Help needed turning from your back to your side while in a  flat bed without using bedrails?: A Little Help needed moving from lying on your back to sitting on the side of a flat bed without using bedrails?: A Little Help needed moving to and from a bed to a chair (including a wheelchair)?: A Little Help needed standing up from a chair using your arms (e.g., wheelchair or bedside chair)?: A Little Help needed to walk in hospital room?: A Little Help needed climbing 3-5 steps with a railing? : A Little 6 Click Score: 18    End of Session Equipment Utilized During Treatment: Gait belt Activity Tolerance: Patient tolerated treatment well Patient left: in chair;with chair alarm set;with SCD's reapplied Nurse Communication: Mobility status PT Visit Diagnosis: Other abnormalities of gait and mobility (R26.89);Difficulty in walking, not elsewhere classified (R26.2)    Time: 5638-9373 PT Time Calculation (min) (ACUTE ONLY): 23 min   Charges:   PT Evaluation $PT Eval Low Complexity: 1 Low PT Treatments $Gait Training: 8-22 mins       Julien Girt, PT Acute Rehabilitation Services Pager [phone]  Office [phone]  Roxine Caddy D Elonda Husky 10/08/2018, 5:54 PM

## 2018-10-08 NOTE — Anesthesia Procedure Notes (Signed)
Spinal  Patient location during procedure: OR Start time: 10/08/2018 10:34 AM End time: 10/08/2018 10:39 AM Staffing Anesthesiologist: Myrtie Soman, MD Resident/CRNA: Glory Buff, CRNA Performed: resident/CRNA  Preanesthetic Checklist Completed: patient identified, site marked, surgical consent, pre-op evaluation, timeout performed, IV checked, risks and benefits discussed and monitors and equipment checked Spinal Block Patient position: sitting Prep: DuraPrep Patient monitoring: heart rate, cardiac monitor, continuous pulse ox and blood pressure Approach: midline Location: L3-4 Injection technique: single-shot Needle Needle type: Pencan  Needle gauge: 24 G Needle length: 9 cm Assessment Sensory level: T4 Additional Notes Kit expiration date checked and verified. Sterile prep and drape, skin local with 1% lidocaine, - heme, - paraesthesia, + CSF, patient tolerated procedure well.

## 2018-10-08 NOTE — Op Note (Signed)
10/08/2018  12:23 PM  PATIENT:  Kathryn [name]    PRE-OPERATIVE DIAGNOSIS:  LEFT knee patellofemoral osteoarthritis  POST-OPERATIVE DIAGNOSIS:  Same   PROCEDURE:  LEFT PATELLOFEMORAL ARTHROPLASTY  SURGEON:  Johnny Bridge, MD  PHYSICIAN ASSISTANT: Kathryn [name], OPA-C, present and scrubbed throughout the case, critical for completion in a timely fashion, and for retraction, instrumentation, and closure.  ANESTHESIA:   Spinal  ESTIMATED BLOOD LOSS: 150 ml  PREOPERATIVE INDICATIONS:  Kathryn [name] is a  48 y.o. female with a diagnosis of patellofemoral osteoarthritis left knee who failed conservative measures and elected for surgical management.  She had an MRI demonstrating full-thickness chondral loss on the lateral facet of the patella, she had some mild to moderate medial chondral changes but no full thickness chondral loss.  The risks benefits and alternatives were discussed with the patient preoperatively including but not limited to the risks of infection, bleeding, nerve injury, cardiopulmonary complications, the need for revision surgery, among others, and the patient was willing to proceed.  OPERATIVE IMPLANTS: Persona size 29 with a size 1 left gender solutions patellofemoral trochlear component  OPERATIVE FINDINGS: She had significant ligamentous laxity, with recurvatum, and at least 30 degrees of varus valgus laxity during examination under anesthesia.  The lateral compartment was completely normal, the medial compartment had some mild to moderate changes but no exposed bone.  The lateral facet of the patella had significant eburnation and the patella bone itself was quite hard during the bony resection.  The lateral facet of the patella was completely exposed, particularly distally.    The patella tracked with a no thumb technique after all the implants were in.   OPERATIVE PROCEDURE: The patient was brought to the operating room and placed in the supine position. Spinal  anesthesia was administered. IV antibiotics were given. The lower extremity was prepped and draped in usual sterile fashion. Time out was performed. The leg was elevated and exsanguinated and a tourniquet was inflated. Medial parapatellar arthrotomy carried out through a standard approach, and the patella was everted and I exposed the femoral trochlea.  I examined the other compartments closely, with the above-named findings noted.  Subluxated the patella, and then drew the line for the orientation of the trochlear implant being perpendicular to Kathryn [name].  I open the distal femur with a drill, and then placed the guide rod, and then the anterior flange resection guide was set with the bone over the anterolateral aspect of the femur taking care not to notch.  I pinned this guide in place, double checked it with an angel wing, and then resected the anterior flange with a saw.  I then sized the implant, she was only 4 foot 11 inches tall, and the implant was fairly small, the size 1 fit the best.  There was no overhang medially or laterally.  I then assembled the jig, secured it with the compression screws, and then used a high-speed bur to prepare the anterior flange.  I then assembled the trial, initially the trial would not fully seat, I reassembled the jig and then removed a little bit more of where the distal tongue of the trial sits into the notch, and this then allow the child to sit down nicely.  I was exactly flush on the lateral side and just slightly recessed on the medial side.  I trimmed off a slight amount of cartilage to allow a smooth transition onto the implant from the cartilage.  I then everted the patella,  it measured 21 mm, and then measured 13.5 after the cut.  The bone was extremely sclerotic and quite hard particularly on the lateral facet.  I drilled the holes for the patella, which was again quite difficult because of her sclerotic bone, ultimately at had a patella it sat  nicely and sized to a 29.  This was taken through a trial range of motion and had excellent reconstruction and tracked with no thumb technique.  Wounds were irrigated copiously, the bony surfaces prepared with pulse lavage, and then the final implants were cemented into place.  The wounds were injected with Marcaine, we did not use Toradol because of her allergy to ibuprofen.  We irrigated the knee copiously, and the retinacular and capsular tissue repaired with 0 Vicryl followed by Vicryl for the subcutaneous tissue with routine closure for the skin with Steri-Strips and sterile gauze.  She was awakened and returned to the PACU in stable and satisfactory condition.  There were no complications and she tolerated the procedure well.  Johnny Bridge, MD

## 2018-10-08 NOTE — Progress Notes (Signed)
Assisted Dr. Rose with left, ultrasound guided, adductor canal block. Side rails up, monitors on throughout procedure. See vital signs in flow sheet. Tolerated Procedure well.  

## 2018-10-08 NOTE — Transfer of Care (Signed)
Immediate Anesthesia Transfer of Care Note  Patient: Kathryn [name]  Procedure(s) Performed: PATELLA-FEMORAL ARTHROPLASTY (Left )  Patient Location: PACU  Anesthesia Type:Spinal  Level of Consciousness: awake, alert  and oriented  Airway & Oxygen Therapy: Patient Spontanous Breathing and Patient connected to nasal cannula oxygen  Post-op Assessment: Report given to RN and Post -op Vital signs reviewed and stable  Post vital signs: Reviewed and stable  Last Vitals:  Vitals Value Taken Time  BP 105/70 10/08/18 1246  Temp    Pulse 73 10/08/18 1247  Resp 16 10/08/18 1247  SpO2 100 % 10/08/18 1247  Vitals shown include unvalidated device data.  Last Pain:  Vitals:   10/08/18 0839  TempSrc:   PainSc: 10-Worst pain ever      Patients Stated Pain Goal: 4 (70/62/37 6283)  Complications: No apparent anesthesia complications

## 2018-10-09 ENCOUNTER — Encounter (HOSPITAL_COMMUNITY): Payer: Self-pay | Admitting: Orthopedic Surgery

## 2018-10-09 LAB — BASIC METABOLIC PANEL
Anion gap: 9 (ref 5–15)
BUN: 12 mg/dL (ref 6–20)
CO2: 23 mmol/L (ref 22–32)
Calcium: 9.2 mg/dL (ref 8.9–10.3)
Chloride: 102 mmol/L (ref 98–111)
Creatinine, Ser: 0.72 mg/dL (ref 0.44–1.00)
GFR calc Af Amer: >60 mL/min (ref >60)
GFR calc non Af Amer: >60 mL/min (ref >60)
Glucose, Bld: 146 mg/dL — ABNORMAL HIGH (ref 70–99)
Potassium: 4.7 mmol/L (ref 3.5–5.1)
Sodium: 134 mmol/L — ABNORMAL LOW (ref 135–145)

## 2018-10-09 LAB — CBC
HCT: 37 % (ref 36.0–46.0)
Hemoglobin: 11.5 g/dL — ABNORMAL LOW (ref 12.0–15.0)
MCH: 29 pg (ref 26.0–34.0)
MCHC: 31.1 g/dL (ref 30.0–36.0)
MCV: 93.2 fL (ref 80.0–100.0)
Platelets: 473 10*3/uL — ABNORMAL HIGH (ref 150–400)
RBC: 3.97 MIL/uL (ref 3.87–5.11)
RDW: 14.5 % (ref 11.5–15.5)
WBC: 19.9 10*3/uL — ABNORMAL HIGH (ref 4.0–10.5)
nRBC: 0 % (ref 0.0–0.2)

## 2018-10-09 NOTE — TOC Progression Note (Signed)
Transition of Care [facility]) - Progression Note    Patient Details  Name: Kathryn [name] MRN: [id number] Date of Birth: [birth year]  Transition of Care [facility]) CM/SW [place], [place] Phone Number: 10/09/2018, 10:26 AM  Clinical Narrative:    Per patient the plan is for [facility] DME: Gilford Rile and 3 in 1 delivered to patient room  Expected Discharge Plan: OP Rehab Barriers to Discharge: No Barriers Identified  Expected Discharge Plan and Services Expected Discharge Plan: OP Rehab                         DME Arranged: 3-N-1, Walker rolling DME Agency: [employer] Date DME Agency Contacted: 10/09/18 Time DME Agency Contacted: [phone] Representative spoke with at DME Agency: [place] ([place]) Interventions    Readmission Risk Interventions No flowsheet data found.

## 2018-10-09 NOTE — Progress Notes (Signed)
Physical Therapy Treatment Patient Details Name: Kathryn [name] MRN: [id number] DOB: 18-Feb-1971 Today's Date: 10/09/2018    History of Present Illness 48 yo female s/p L patellofemoral arthroplasty on 10/08/18. PMH includes anxiety, OA, COPD, depression, GERD, neuropathy.    PT Comments    Pt cooperative this pm and able to ambulate increased distance from am but continues pain ltd.   Follow Up Recommendations  Follow surgeon's recommendation for DC plan and follow-up therapies;Supervision for mobility/OOB     Equipment Recommendations  Rolling walker with 5" wheels    Recommendations for Other Services       Precautions / Restrictions Precautions Precautions: Fall Required Braces or Orthoses: Knee Immobilizer - Left Knee Immobilizer - Left: Discontinue once straight leg raise with < 10 degree lag Restrictions Weight Bearing Restrictions: No Other Position/Activity Restrictions: WBAT    Mobility  Bed Mobility Overal bed mobility: Needs Assistance Bed Mobility: Supine to Sit;Sit to Supine     Supine to sit: Min assist Sit to supine: Min assist   General bed mobility comments: min assist for L LE and use of bed rail  Transfers Overall transfer level: Needs assistance Equipment used: Rolling walker (2 wheeled) Transfers: Sit to/from Stand Sit to Stand: Min assist;From elevated surface         General transfer comment: Min assist for power up, steadying upon standing. Verbal cuing for hand placement when rising.  Ambulation/Gait Ambulation/Gait assistance: Min guard Gait Distance (Feet): 42 Feet Assistive device: Rolling walker (2 wheeled) Gait Pattern/deviations: Step-to pattern;Decreased step length - left;Decreased step length - right;Antalgic;Decreased weight shift to left Gait velocity: decr   General Gait Details: Min guard for safety, verbal cuing for sequencing, placement in RW, and posture.; distance ltd by pain   Stairs              Wheelchair Mobility    Modified Rankin (Stroke Patients Only)       Balance Overall balance assessment: Needs assistance Sitting-balance support: No upper extremity supported;Feet supported Sitting balance-Leahy Scale: Good     Standing balance support: Bilateral upper extremity supported Standing balance-Leahy Scale: Poor Standing balance comment: reliant on RW for steadying                            Cognition Arousal/Alertness: Awake/alert Behavior During Therapy: WFL for tasks assessed/performed Overall Cognitive Status: Within Functional Limits for tasks assessed                                 General Comments: Pt very pleasant      Exercises Total Joint Exercises Ankle Circles/Pumps: AROM;Both;Supine;20 reps Quad Sets: AROM;Both;5 reps;Supine Heel Slides: AAROM;Left;Supine;10 reps Straight Leg Raises: AAROM;Left;5 reps;Supine Goniometric ROM: AAROM L knee -5 - 25 pain ltd    General Comments        Pertinent Vitals/Pain Pain Assessment: 0-10 Pain Score: 10-Worst pain ever Pain Location: L knee Pain Descriptors / Indicators: Guarding;Grimacing;Sore;Throbbing Pain Intervention(s): Limited activity within patient's tolerance;Monitored during session;Premedicated before session;Patient requesting pain meds-RN notified;Ice applied    Home Living                      Prior Function            PT Goals (current goals can now be found in the care plan section) Acute Rehab PT Goals Patient Stated Goal: decrease L knee  pain PT Goal Formulation: With patient Time For Goal Achievement: 10/15/18 Potential to Achieve Goals: Good Progress towards PT goals: Progressing toward goals    Frequency    7X/week      PT Plan Current plan remains appropriate    Co-evaluation              AM-PAC PT "6 Clicks" Mobility   Outcome Measure  Help needed turning from your back to your side while in a flat bed without  using bedrails?: A Little Help needed moving from lying on your back to sitting on the side of a flat bed without using bedrails?: A Little Help needed moving to and from a bed to a chair (including a wheelchair)?: A Little Help needed standing up from a chair using your arms (e.g., wheelchair or bedside chair)?: A Little Help needed to walk in hospital room?: A Little Help needed climbing 3-5 steps with a railing? : A Lot 6 Click Score: 17    End of Session Equipment Utilized During Treatment: Gait belt Activity Tolerance: Patient limited by fatigue;Patient limited by pain Patient left: in bed Nurse Communication: Mobility status PT Visit Diagnosis: Other abnormalities of gait and mobility (R26.89);Difficulty in walking, not elsewhere classified (R26.2)     Time: 6283-1517 PT Time Calculation (min) (ACUTE ONLY): 25 min  Charges:  $Gait Training: 23-37 mins $Therapeutic Exercise: 8-22 mins                     [place] Pager [phone] Office [phone]    Shiri Hodapp 10/09/2018, 2:49 PM

## 2018-10-09 NOTE — Discharge Summary (Signed)
Physician Discharge Summary  Patient ID: Kathryn [name] MRN: [id number] DOB/AGE: Oct 19, 1970 48 y.o.  Admit date: 10/08/2018 Discharge date: 10/09/2018  Admission Diagnoses:  Osteoarthritis of left knee  Discharge Diagnoses:  Principal Problem:   Osteoarthritis of left knee, patellofemoral Active Problems:   Patellofemoral arthritis of left knee   Past Medical History:  Diagnosis Date  . Anxiety   . Arthritis   . Asthma   . Chronic back pain   . COPD (chronic obstructive pulmonary disease) (HCC)    Chronic bronchitis  . Depression   . GERD (gastroesophageal reflux disease)   . Hypertension   . Neuropathy   . Osteoarthritis of left knee, 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    Surgeries: Procedure(s): PATELLA-FEMORAL ARTHROPLASTY on 10/08/2018   Consultants (if any):   Discharged Condition: Improved  Hospital Course: Kathryn [name] is an 48 y.o. female who was admitted 10/08/2018 with a diagnosis of Osteoarthritis of left knee and went to the operating room on 10/08/2018 and underwent the above named procedures.    She was given perioperative antibiotics:  Anti-infectives (From admission, onward)   Start     Dose/Rate Route Frequency Ordered Stop   10/08/18 1600  ceFAZolin (ANCEF) IVPB 2g/100 mL premix     2 g 200 mL/hr over 30 Minutes Intravenous Every 6 hours 10/08/18 1357 10/08/18 2343   10/08/18 0815  ceFAZolin (ANCEF) IVPB 2g/100 mL premix     2 g 200 mL/hr over 30 Minutes Intravenous On call to O.R. 10/08/18 2130 10/08/18 1107    .  She was given sequential compression devices, early ambulation, and aspirin for DVT prophylaxis.  She benefited maximally from the hospital stay and there were no complications.    Recent vital signs:  Vitals:   10/09/18 0923 10/09/18 1339  BP: (!) 146/84 (!) 141/86  Pulse: 96 95  Resp: 15 16  Temp: 97.9 F (36.6 C) 98.6 F (37 C)  SpO2: 99% 97%    Recent laboratory studies:  Lab Results  Component Value Date   HGB 11.5 (L)  10/09/2018   HGB 11.7 (L) 10/04/2018   HGB 12.4 09/18/2018   Lab Results  Component Value Date   WBC 19.9 (H) 10/09/2018   PLT 473 (H) 10/09/2018   No results found for: INR Lab Results  Component Value Date   NA 134 (L) 10/09/2018   K 4.7 10/09/2018   CL 102 10/09/2018   CO2 23 10/09/2018   BUN 12 10/09/2018   CREATININE 0.72 10/09/2018   GLUCOSE 146 (H) 10/09/2018    Discharge Medications:   Allergies as of 10/09/2018      Reactions   Clonopin [clonazepam] Anaphylaxis   Swelling of the tongue and mouth.    Fish Allergy Anaphylaxis, Shortness Of Breath, Swelling   Flexeril [cyclobenzaprine Hcl] Shortness Of Breath   Ibuprofen Anaphylaxis, Hives   Shellfish Allergy Anaphylaxis   Ace Inhibitors Cough   Tramadol Nausea And Vomiting   Upset stomach      Medication List    STOP taking these medications   acetaminophen-codeine 300-30 MG tablet Commonly known as: TYLENOL #3   cephALEXin 500 MG capsule Commonly known as: KEFLEX   methylPREDNISolone 4 MG Tbpk tablet Commonly known as: MEDROL DOSEPAK     TAKE these medications   albuterol 108 (90 Base) MCG/ACT inhaler Commonly known as: VENTOLIN HFA Inhale 2 puffs into the lungs 2 (two) times daily.   albuterol (2.5 MG/3ML) 0.083% nebulizer solution Commonly known as: PROVENTIL Take 2.5 mg by  nebulization every 6 (six) hours as needed for wheezing or shortness of breath.   amLODipine 5 MG tablet Commonly known as: NORVASC Take 5 mg by mouth daily.   aspirin EC 325 MG tablet Take 1 tablet (325 mg total) by mouth 2 (two) times daily.   benzonatate 200 MG capsule Commonly known as: TESSALON Take 1 capsule (200 mg total) by mouth 3 (three) times daily as needed for cough. Swallow whole, do not chew What changed: when to take this   cholecalciferol 25 MCG (1000 UT) tablet Commonly known as: VITAMIN D3 Take 2,000 Units by mouth daily.   citalopram 20 MG tablet Commonly known as: CELEXA Take 20 mg by mouth  daily.   famotidine 20 MG tablet Commonly known as: PEPCID Take 1 tablet (20 mg total) by mouth 2 (two) times daily.   fluticasone 50 MCG/ACT nasal spray Commonly known as: FLONASE Place 1 spray into both nostrils daily.   gabapentin 300 MG capsule Commonly known as: NEURONTIN Take 1 capsule (300 mg total) by mouth 3 (three) times daily.   hydrochlorothiazide 12.5 MG capsule Commonly known as: MICROZIDE Take 12.5 mg by mouth daily.   loratadine 10 MG tablet Commonly known as: CLARITIN Take 10 mg by mouth daily.   methocarbamol 750 MG tablet Commonly known as: ROBAXIN Take 1 tablet (750 mg total) by mouth 3 (three) times daily as needed for muscle spasms.   mirtazapine 30 MG tablet Commonly known as: REMERON Take 30 mg by mouth at bedtime.   omeprazole 20 MG capsule Commonly known as: PRILOSEC Take 1 po BID x 2 weeks then once a day What changed:   how much to take  how to take this  when to take this  additional instructions   ondansetron 4 MG tablet Commonly known as: Zofran Take 1 tablet (4 mg total) by mouth every 8 (eight) hours as needed for nausea or vomiting.   oxyCODONE 5 MG immediate release tablet Commonly known as: Roxicodone Take 1 tablet (5 mg total) by mouth every 4 (four) hours as needed for severe pain.   potassium chloride SA 20 MEQ tablet Commonly known as: K-DUR Take 1 tablet (20 mEq total) by mouth 2 (two) times daily.   sennosides-docusate sodium 8.6-50 MG tablet Commonly known as: SENOKOT-S Take 2 tablets by mouth daily.   traZODone 150 MG tablet Commonly known as: DESYREL Take 150-300 mg by mouth at bedtime as needed for sleep.       Diagnostic Studies: Dg Chest Portable 1 View  Result Date: 09/18/2018 CLINICAL DATA:  48 year old female with fever cough nausea and vomiting for 2 days. EXAM: PORTABLE CHEST 1 VIEW COMPARISON:  Two-view chest radiographs 07/04/2018 and earlier. FINDINGS: Portable AP upright view at 0917 hours.  Lung volumes and mediastinal contours remain normal. Visualized tracheal air column is within normal limits. Allowing for portable technique the lungs are clear. Paucity of bowel gas in the upper abdomen. No osseous abnormality identified. IMPRESSION: Negative portable chest. Electronically Signed   By: Genevie Ann M.D.   On: 09/18/2018 10:07   Dg Knee Left Port  Result Date: 10/08/2018 CLINICAL DATA:  Partial knee replacement. EXAM: PORTABLE LEFT KNEE - 1-2 VIEW COMPARISON:  MRI 01/10/2018 FINDINGS: There is a lateral patellofemoral component hemiarthroplasty. The femoral component appears well seated without complicating features. The patellar component also appears normal. IMPRESSION: Hemiarthroplasty components in good position without complicating features. Electronically Signed   By: Marijo Sanes M.D.   On: 10/08/2018 14:13  Disposition: Discharge disposition: 01-Home or Self Care         Follow-up Information    Marchia Bond, MD. Schedule an appointment as soon as possible for a visit in 2 weeks.   Specialty: Orthopedic Surgery Contact information: [address] [place] [address] [phone]            Signed: Johnny Bridge 10/09/2018, 4:12 PM

## 2018-10-09 NOTE — Progress Notes (Signed)
Physical Therapy Treatment Patient Details Name: Kathryn [name] MRN: [id number] DOB: 25-Dec-1970 Today's Date: 10/09/2018    History of Present Illness 48 yo female s/p L patellofemoral arthroplasty on 10/08/18. PMH includes anxiety, OA, COPD, depression, GERD, neuropathy.    PT Comments    Pt cooperative throughout session but requiring increased time and pain limited for all tasks.    Follow Up Recommendations  Follow surgeon's recommendation for DC plan and follow-up therapies;Supervision for mobility/OOB     Equipment Recommendations  Rolling walker with 5" wheels(pt is 4'11)    Recommendations for Other Services       Precautions / Restrictions Precautions Precautions: Fall Required Braces or Orthoses: Knee Immobilizer - Left Knee Immobilizer - Left: Discontinue once straight leg raise with < 10 degree lag Restrictions Weight Bearing Restrictions: No Other Position/Activity Restrictions: WBAT    Mobility  Bed Mobility Overal bed mobility: Needs Assistance Bed Mobility: Supine to Sit     Supine to sit: Min assist     General bed mobility comments: min assist for L LE and use of bed rail  Transfers Overall transfer level: Needs assistance Equipment used: Rolling walker (2 wheeled) Transfers: Sit to/from Stand Sit to Stand: Min assist;From elevated surface         General transfer comment: Min assist for power up, steadying upon standing. Verbal cuing for hand placement when rising.  Ambulation/Gait Ambulation/Gait assistance: Min guard Gait Distance (Feet): 21 Feet Assistive device: Rolling walker (2 wheeled) Gait Pattern/deviations: Step-to pattern;Decreased step length - left;Decreased step length - right;Antalgic;Decreased weight shift to left Gait velocity: decr   General Gait Details: Min guard for safety, verbal cuing for sequencing, placement in RW, and posture.; distance ltd by pain   Stairs             Wheelchair Mobility    Modified  Rankin (Stroke Patients Only)       Balance Overall balance assessment: Needs assistance Sitting-balance support: No upper extremity supported;Feet supported Sitting balance-Leahy Scale: Good     Standing balance support: Bilateral upper extremity supported Standing balance-Leahy Scale: Poor Standing balance comment: reliant on RW for steadying                            Cognition Arousal/Alertness: Awake/alert Behavior During Therapy: WFL for tasks assessed/performed Overall Cognitive Status: Within Functional Limits for tasks assessed                                 General Comments: Pt very pleasant      Exercises Total Joint Exercises Ankle Circles/Pumps: AROM;Both;Supine;20 reps Quad Sets: AROM;Both;5 reps;Supine Heel Slides: AAROM;Left;Supine;10 reps Straight Leg Raises: AAROM;Left;5 reps;Supine Goniometric ROM: AAROM L knee -5 - 25 pain ltd    General Comments        Pertinent Vitals/Pain Pain Assessment: 0-10 Pain Score: 10-Worst pain ever Pain Location: L knee Pain Descriptors / Indicators: Sore;Sharp Pain Intervention(s): Limited activity within patient's tolerance;Monitored during session;Premedicated before session;Ice applied    Home Living                      Prior Function            PT Goals (current goals can now be found in the care plan section) Acute Rehab PT Goals Patient Stated Goal: decrease L knee pain PT Goal Formulation: With patient Time For  Goal Achievement: 10/15/18 Potential to Achieve Goals: Good Progress towards PT goals: Progressing toward goals    Frequency    7X/week      PT Plan Current plan remains appropriate    Co-evaluation              AM-PAC PT "6 Clicks" Mobility   Outcome Measure  Help needed turning from your back to your side while in a flat bed without using bedrails?: A Little Help needed moving from lying on your back to sitting on the side of a flat bed  without using bedrails?: A Little Help needed moving to and from a bed to a chair (including a wheelchair)?: A Little Help needed standing up from a chair using your arms (e.g., wheelchair or bedside chair)?: A Little Help needed to walk in hospital room?: A Little Help needed climbing 3-5 steps with a railing? : A Lot 6 Click Score: 17    End of Session Equipment Utilized During Treatment: Gait belt Activity Tolerance: Patient limited by fatigue;Patient limited by pain Patient left: in chair;with chair alarm set Nurse Communication: Mobility status PT Visit Diagnosis: Other abnormalities of gait and mobility (R26.89);Difficulty in walking, not elsewhere classified (R26.2)     Time: 4270-6237 PT Time Calculation (min) (ACUTE ONLY): 28 min  Charges:  $Gait Training: 8-22 mins $Therapeutic Exercise: 8-22 mins                     [place] Pager [phone] Office [phone]    Caralina Nop 10/09/2018, 1:02 PM

## 2018-10-09 NOTE — Progress Notes (Addendum)
Patient ID: Kathryn [name], female   DOB: 1970-05-03, 48 y.o.   MRN: [id number]     Subjective:  Patient reports pain as mild to moderate.  Patient in the bed and in no acute distress.    Objective:   VITALS:   Vitals:   10/09/18 0438 10/09/18 0810 10/09/18 0923 10/09/18 1339  BP: 130/89  (!) 146/84 (!) 141/86  Pulse: 81  96 95  Resp: 16  15 16   Temp: 97.9 F (36.6 C)  97.9 F (36.6 C) 98.6 F (37 C)  TempSrc: Oral  Oral   SpO2: 100% 94% 99% 97%  Weight:      Height:        ABD soft Sensation intact distally Dorsiflexion/Plantar flexion intact Incision: dressing C/D/I and no drainage   Lab Results  Component Value Date   WBC 19.9 (H) 10/09/2018   HGB 11.5 (L) 10/09/2018   HCT 37.0 10/09/2018   MCV 93.2 10/09/2018   PLT 473 (H) 10/09/2018   BMET    Component Value Date/Time   NA 134 (L) 10/09/2018 0317   K 4.7 10/09/2018 0317   CL 102 10/09/2018 0317   CO2 23 10/09/2018 0317   GLUCOSE 146 (H) 10/09/2018 0317   BUN 12 10/09/2018 0317   CREATININE 0.72 10/09/2018 0317   CREATININE 0.74 04/20/2017 0955   CALCIUM 9.2 10/09/2018 0317   GFRNONAA >60 10/09/2018 0317   GFRNONAA 96 04/20/2017 0955   GFRAA >60 10/09/2018 0317   GFRAA 111 04/20/2017 0955     Assessment/Plan: 1 Day Post-Op   Principal Problem:   Osteoarthritis of left knee, patellofemoral Active Problems:   Patellofemoral arthritis of left knee   Advance diet Up with therapy Plan for discharge tomorrow WBAT Dry dressing PRN   Anticipated LOS equal to or greater than 2 midnights due to - Age 14 and older with one or more of the following:  - Obesity  - Expected need for hospital services (PT, OT, Nursing) required for safe  discharge  - Anticipated need for postoperative skilled nursing care or inpatient rehab  - Active co-morbidities: None OR   - Unanticipated findings during/Post Surgery: Slow post-op progression: GI, pain control, mobility  - Patient is a high risk of  re-admission due to: None     Lunette Stands 10/09/2018, 1:44 PM  Agree with above.  Got call from floor 4:11 PM She is actually ready for discharge.  Med rec completed.     Marchia Bond, MD Cell [phone]

## 2018-11-19 ENCOUNTER — Ambulatory Visit (HOSPITAL_COMMUNITY): Payer: Medicaid Other | Attending: Orthopedic Surgery | Admitting: Physical Therapy

## 2018-11-19 ENCOUNTER — Other Ambulatory Visit: Payer: Self-pay

## 2018-11-19 ENCOUNTER — Encounter (HOSPITAL_COMMUNITY): Payer: Self-pay | Admitting: Physical Therapy

## 2018-11-19 DIAGNOSIS — R2689 Other abnormalities of gait and mobility: Secondary | ICD-10-CM | POA: Diagnosis not present

## 2018-11-19 DIAGNOSIS — M25562 Pain in left knee: Secondary | ICD-10-CM | POA: Diagnosis not present

## 2018-11-19 DIAGNOSIS — M6281 Muscle weakness (generalized): Secondary | ICD-10-CM | POA: Diagnosis not present

## 2018-11-19 NOTE — Therapy (Signed)
[facility] [address] [place], [place], [address] Phone: [phone]   Fax:  [phone]  Physical Therapy Evaluation  Patient Details  Name: Kathryn [name] MRN: [id number] Date of Birth: 1970/11/12 Referring Provider (PT): Marchia Bond, MD   Encounter Date: 11/19/2018  PT End of Session - 11/19/18 1558    Visit Number  1    Number of Visits  4    Date for PT Re-Evaluation  10/16/18    Authorization Type  medicaid requesed visits on 4/3    PT Start Time  1515    PT Stop Time  1600    PT Time Calculation (min)  45 min    Activity Tolerance  Patient limited by pain    Behavior During Therapy  [facility] for tasks assessed/performed       Past Medical History:  Diagnosis Date  . Anxiety   . Arthritis   . Asthma   . Chronic back pain   . COPD (chronic obstructive pulmonary disease) (HCC)    Chronic bronchitis  . Depression   . GERD (gastroesophageal reflux disease)   . Hypertension   . Neuropathy   . Osteoarthritis of left knee, 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    Past Surgical History:  Procedure Laterality Date  . PATELLA-FEMORAL ARTHROPLASTY Left 10/08/2018   Procedure: PATELLA-FEMORAL ARTHROPLASTY;  Surgeon: Marchia Bond, MD;  Location: [facility];  Service: Orthopedics;  Laterality: Left;  . TUBAL LIGATION      There were no vitals filed for this visit.   Subjective Assessment - 11/19/18 1508    Subjective  Kathryn [name] states that her Lt knee was giving out on her, she opted to have a Lt patellafemoral arthroplasty on 10/08/2018. She was discharged the next day.  The only thing that she has been doing is trying to walk.  She still has a significant amount of pain    Pertinent History  B chronic knee pain,    How long can you sit comfortably?  Able to sit for about 30 minutes    How long can you stand comfortably?  able to stand for about five minutes    How long can you walk comfortably?  walks with a cane for about 20 feet prior to  wanting to sit down.    Patient Stated Goals  To be able to walk again without difficulty.  Do things with her grandchildren, be able to stand and  sit for longer periods of time without pain. Sleep better.  (CUrrently waking up 3-4 times and having difficulty getting back to sleep).    Currently in Pain?  Yes    Pain Score  8    will go to a 10; best 7/10 after icing   Pain Location  Knee    Pain Orientation  Left    Pain Descriptors / Indicators  Aching;Throbbing    Pain Type  Acute pain    Pain Onset  More than a month ago    Pain Frequency  Constant    Aggravating Factors   activity    Pain Relieving Factors  ice and medication    Effect of Pain on Daily Activities  limits         [facility] PT Assessment - 11/19/18 0001      Assessment   Medical Diagnosis  Lt patellafemoral arthroplastuy     Referring Provider (PT)  Marchia Bond, MD    Onset Date/Surgical Date  10/08/18    Prior Therapy  3 days in May for knee pain       Precautions   Precautions  None      Restrictions   Weight Bearing Restrictions  No      Balance Screen   Has the patient fallen in the past 6 months  Yes    How many times?  2    Has the patient had a decrease in activity level because of a fear of falling?   Yes    Is the patient reluctant to leave their home because of a fear of falling?   Yes      Kathryn [name]  Private residence    Living Arrangements  Other relatives   2 grandkids, 30, 76 year old   Available Help at Discharge  Family   son and daughter check in on her   Type of [place] to enter    Entrance Stairs-Number of Steps  3    Entrance Stairs-Rails  None    Home Layout  One level      Prior Function   Level of Independence  Needs assistance with homemaking;Needs assistance with ADLs;Independent with community mobility with device;Independent with household mobility with device      Cognition   Overall Cognitive Status  Within  Functional Limits for tasks assessed      Observation/Other Assessments   Focus on Therapeutic Outcomes (FOTO)   16      Functional Tests   Functional tests  Single leg stance;Sit to Stand      Single Leg Stance   Comments  unable      Sit to Stand   Comments  unable without use of UE       AROM   Right Knee Extension  0    Right Knee Flexion  124    Left Knee Extension  0    Left Knee Flexion  80      Strength   Right Hip Flexion  4+/5    Right Hip Extension  3+/5    Right Hip ABduction  5/5    Left Hip Flexion  2/5    Left Hip Extension  3-/5    Left Hip ABduction  4-/5    Right Knee Flexion  5/5    Right Knee Extension  5/5    Left Knee Flexion  3-/5    Left Knee Extension  3/5                Objective measurements completed on examination: See above findings.      [facility] Adult PT Treatment/Exercise - 11/19/18 0001      Exercises   Exercises  Knee/Hip      Knee/Hip Exercises: Seated   Long Arc Quad  Left;10 reps      Knee/Hip Exercises: Supine   Quad Sets  10 reps    Heel Slides  Left;5 reps    Straight Leg Raises  Left;10 reps      Knee/Hip Exercises: Sidelying   Hip ABduction  10 reps      Knee/Hip Exercises: Prone   Hamstring Curl  10 reps    Hip Extension  Left;10 reps             PT Education - 11/19/18 1555    Education Details  HEP    Person(s) Educated  Patient    Methods  Explanation;Handout    Comprehension  Verbalized understanding;Returned  demonstration       PT Short Term Goals - 11/19/18 1605      PT SHORT TERM GOAL #1   Title  Patient will be independent with HEP to increase Lt knee flexion to 100 degrees to allow pt to sit for an hour with comfort.    Time  3    Period  Weeks    Status  New    Target Date  12/17/18      PT SHORT TERM GOAL #2   Title  PT will be able to walk for five minutes without having to rest    Baseline  needed to rest after 15 seconds    Time  3    Status  New      PT SHORT TERM  GOAL #3   Title  PT to be able to stand for 10 minutes in order to be able to complete self grooming tasks.    Time  3    Period  Weeks    Status  New    Target Date  09/25/18        PT Long Term Goals - 11/19/18 1609      PT LONG TERM GOAL #1   Title  Patient will improve Lt knee flexion to 110 to be able to squat to pick items off of the floor.    Time  6    Period  Weeks    Status  New    Target Date  01/07/19      PT LONG TERM GOAL #2   Title  PT will be able to stand/walk for 15 minutes to be able to complete light housework    Time  6    Period  Weeks    Status  New      PT LONG TERM GOAL #3   Title  PT strength will be improved in her hip and knee to at least 4+/5 to allow pt to go up and down steps in a recipricol manner    Time  6    Period  Weeks    Status  New      PT LONG TERM GOAL #4   Title  Pt pain will be no greater than a 4/10 to allow pt to walk without an assistive device    Time  6    Period  Weeks    Status  New      PT LONG TERM GOAL #5   Title  PT to be waking only one time throughout the night for improved sleep.    Time  6    Period  Weeks    Status  New             Plan - 11/19/18 1601    Clinical Impression Statement  Ms. Barkow returns to skilled PT after undergoing a Lt patellafemoral arthroplasty on 10/08/2018.  Her evaluation demonstates increased swelling, increased pain, decreased ROM and decreased functional tolerance.  Ms. Aten will benefit from skilled therapy to address these issues and maximize her functional abilityn    Personal Factors and Comorbidities  Education;Past/Current Experience;Fitness;Comorbidity 3+;Behavior Pattern;Other;Profession    Comorbidities  hypertension, major depressive disorder, bil knee osteoarthritis, chroinc pain, neuropathy,    history of substance abuse; unemployed applying for disability   Examination-Activity Limitations  Bathing;Caring for Others;Dressing;Stairs;Bend;Stand;Locomotion  Level;Lift;Bed Mobility;Carry;Sleep;Sit    Examination-Participation Restrictions  Medication Management;Cleaning;Meal Prep;Laundry    Stability/Clinical Decision Making  Evolving/Moderate complexity    Rehab  Potential  Fair    PT Frequency  3x / week    PT Duration  6 weeks   only able to request 3 initially from insurance   PT Treatment/Interventions  ADLs/Self Care Home Management;Aquatic Therapy;Cryotherapy;Electrical Stimulation;Iontophoresis 4mg /ml Dexamethasone;Moist Heat;DME Instruction;Gait training;Stair training;Functional mobility training;Therapeutic activities;Therapeutic exercise;Balance training;Neuromuscular re-education;Patient/family education;Manual techniques;Passive range of motion;Joint Manipulations    PT Next Visit Plan  begin heel toe gait training, heel raises, functional minisquat, standing and supine terminal extension.    PT Home Exercise Plan  8/4:  LAQ, Q-set, 4-way SLR, prone knee flexion, heelslides    Consulted and Agree with Plan of Care  Patient       Patient will benefit from skilled therapeutic intervention in order to improve the following deficits and impairments:  Abnormal gait, Decreased activity tolerance, Decreased endurance, Decreased range of motion, Decreased strength, Pain, Improper body mechanics, Decreased balance, Decreased mobility, Difficulty walking, Increased edema, Impaired flexibility, Postural dysfunction, Obesity, Hypomobility, Impaired perceived functional ability  Visit Diagnosis: 1. Left knee pain, unspecified chronicity   2. Other abnormalities of gait and mobility   3. Muscle weakness (generalized)        Problem List Patient Active Problem List   Diagnosis Date Noted  . Patellofemoral arthritis of left knee 10/08/2018  . Enlarged uterus 02/20/2018  . Screening for colorectal cancer 02/20/2018  . Routine cervical smear 02/20/2018  . Encounter for gynecological examination with Papanicolaou smear of cervix 02/20/2018  .  Osteoarthritis of left knee, 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  . MDD (major depressive disorder), recurrent episode, severe ([place]) 12/24/2017  . Lumbar back pain 05/11/2017  . Depression, major, single episode, moderate ([place]) 05/11/2017  . Low vitamin B12 level 05/11/2017  . Immunization due 05/11/2017  . Hypertension goal BP (blood pressure) < 140/90 01/11/2017    Rayetta Humphrey, PT CLT [phone] 11/19/2018, 4:17 PM  [facility] [address] [place], [place], [address] Phone: [phone]   Fax:  [phone]  Name: Kathryn [name] MRN: [id number] Date of Birth: 1970-12-10

## 2018-11-19 NOTE — Patient Instructions (Addendum)
Knee Extension (Sitting)    Place __0__ pound weight on left ankle and straighten knee fully, lower slowly. Repeat ___10_ times per set. Do _1___ sets per session. Do _2___ sessions per day.  http://orth.exer.us/732   Copyright  VHI. All rights reserved.  Strengthening: Quadriceps Set    Tighten muscles on top of thighs by pushing knees down into surface. Hold _5___ seconds. Repeat __10__ times per set. Do __1__ sets per session. Do _2___ sessions per day.  http://orth.exer.us/602   Copyright  VHI. All rights reserved.  Self-Mobilization: Heel Slide (Supine)    Slide left heel toward buttocks until a gentle stretch is felt. Hold _5___ seconds. Relax. Repeat _10___ times per set. Do __1__ sets per session. Do __2__ sessions per day.  http://orth.exer.us/710   Copyright  VHI. All rights reserved.  Straight Leg: with Bent Knee (Supine)    With right leg straight, other leg bent, raise straight leg _5___ inches. Repeat _10___ times per set. Do _1___ sets per session. Do _2___ sessions per day.  http://orth.exer.us/686   Copyright  VHI. All rights reserved.  Strengthening: Hip Abduction (Side-Lying)    Tighten muscles on front of left thigh, then lift leg __10__ inches from surface, keeping knee locked.  Repeat _10___ times per set. Do __1__ sets per session. Do __2__ sessions per day.  http://orth.exer.us/622   Copyright  VHI. All rights reserved.  Self-Mobilization: Knee Flexion (Prone)    Bring left heel toward buttocks as close as possible. Hold __3__ seconds. Relax. Repeat 10____ times per set. Do ___1_ sets per session. Do _2___ sessions per day.  http://orth.exer.us/596   Copyright  VHI. All rights reserved.  Strengthening: Hip Extension (Prone)    Tighten muscles on front of left thigh, then lift leg __3__ inches from surface, keeping knee locked. Repeat to the right Repeat 10____ times per set. Do ___1_ sets per session. Do __2__ sessions per  day.  http://orth.exer.us/620   Copyright  VHI. All rights reserved.

## 2018-11-22 ENCOUNTER — Ambulatory Visit (HOSPITAL_COMMUNITY): Payer: Medicaid Other | Admitting: Physical Therapy

## 2018-11-26 ENCOUNTER — Telehealth (HOSPITAL_COMMUNITY): Payer: Self-pay | Admitting: Physical Therapy

## 2018-11-26 ENCOUNTER — Ambulatory Visit (HOSPITAL_COMMUNITY): Payer: Medicaid Other | Admitting: Physical Therapy

## 2018-11-26 NOTE — Telephone Encounter (Signed)
Called regarding patient not showing up for appointment this date. Planned to inform patient of next scheduled appointment and make sure she was okay. However, there was no answer and was unable to leave a voicemail.  Clarene Critchley PT, DPT 4:24 PM, 11/26/18 [phone]

## 2018-11-28 ENCOUNTER — Telehealth (HOSPITAL_COMMUNITY): Payer: Self-pay | Admitting: Physical Therapy

## 2018-11-28 ENCOUNTER — Ambulatory Visit (HOSPITAL_COMMUNITY): Payer: Medicaid Other | Admitting: Physical Therapy

## 2018-11-28 ENCOUNTER — Encounter (HOSPITAL_COMMUNITY): Payer: Self-pay | Admitting: Physical Therapy

## 2018-11-28 NOTE — Telephone Encounter (Signed)
I went ahead and re-submitted her Medicaid today even tho she no-showed, as her next visit is on the 18th and with a PTA but her Medicaid cert ends 04/18/70.   If she no-shows this next session, we will [place] PT, DPT, CBIS  Supplemental Physical Therapist [facility]    Pager [phone] Acute Rehab Office [phone]

## 2018-11-28 NOTE — Telephone Encounter (Signed)
No-show. Attempted to call but phone rang and then went to a message that said "call cannot be completed at this time". Unable to reach patient.  Deniece Ree PT, DPT, CBIS  Supplemental Physical Therapist [facility]    Pager [phone] Acute Rehab Office [phone]

## 2018-12-03 ENCOUNTER — Telehealth (HOSPITAL_COMMUNITY): Payer: Self-pay | Admitting: Internal Medicine

## 2018-12-03 ENCOUNTER — Ambulatory Visit (HOSPITAL_COMMUNITY): Payer: Medicaid Other

## 2018-12-03 ENCOUNTER — Other Ambulatory Visit: Payer: Self-pay | Admitting: Family Medicine

## 2018-12-03 DIAGNOSIS — D1803 Hemangioma of intra-abdominal structures: Secondary | ICD-10-CM

## 2018-12-03 NOTE — Telephone Encounter (Signed)
pt cx because she has no one to watch the grandbaby  12/03/18

## 2018-12-05 ENCOUNTER — Ambulatory Visit (HOSPITAL_COMMUNITY): Payer: Medicaid Other

## 2018-12-10 ENCOUNTER — Ambulatory Visit (HOSPITAL_COMMUNITY): Payer: Medicaid Other | Admitting: Physical Therapy

## 2018-12-11 ENCOUNTER — Telehealth (HOSPITAL_COMMUNITY): Payer: Self-pay | Admitting: Physical Therapy

## 2018-12-11 NOTE — Telephone Encounter (Signed)
Pt with 3rd NS on 8/25.  Attempted to call without success; mailbox is full.  Pt will be discharged from therapy due to 3 NS policy.   Teena Irani, PTA/CLT [phone]

## 2018-12-12 ENCOUNTER — Ambulatory Visit (HOSPITAL_COMMUNITY): Payer: Medicaid Other

## 2018-12-17 ENCOUNTER — Encounter (HOSPITAL_COMMUNITY): Payer: Medicaid Other

## 2018-12-19 ENCOUNTER — Encounter (HOSPITAL_COMMUNITY): Payer: Medicaid Other

## 2018-12-19 ENCOUNTER — Encounter (HOSPITAL_COMMUNITY): Payer: Medicaid Other | Admitting: Physical Therapy

## 2018-12-20 ENCOUNTER — Encounter (HOSPITAL_COMMUNITY): Payer: Self-pay

## 2018-12-20 NOTE — Therapy (Signed)
[place], [place], [address] Phone: [phone]   Fax:  [phone]  Patient Details  Name: Kathryn [name] MRN: [id number] Date of Birth: [birth year] Referring Provider:  No ref. provider found  Encounter Date: 12/20/2018   PHYSICAL THERAPY DISCHARGE SUMMARY  Visits from Start of Care: 1  Current functional level related to goals / functional outcomes: Patient arrived for evaluation on 11/19/18 and did not show up for any following treatment sessions. She no-showed for 4 of her appointment and cancelled an appointment as well. Attempts to contact pt were made each time and were unsuccessful as voice mailbox was full. She will be discharged form this episode of PT and will require a new referral to return.   Remaining deficits: See evaluation for details.   Education / Equipment:  Patient educated on HEP at [employer] and educate don plan for future appointments.   Plan: Patient agrees to discharge.  Patient goals were not met. Patient is being discharged due to not returning since the last visit.  ?????     Kipp Brood, PT, DPT, [facility] Physical Therapist with [facility]  12/20/2018 8:47 AM    [facility] [address] [place], [place], [address] Phone: [phone]   Fax:  [phone]

## 2018-12-24 ENCOUNTER — Emergency Department (HOSPITAL_COMMUNITY): Payer: Medicaid Other

## 2018-12-24 ENCOUNTER — Encounter (HOSPITAL_COMMUNITY): Payer: Self-pay | Admitting: Emergency Medicine

## 2018-12-24 ENCOUNTER — Inpatient Hospital Stay (HOSPITAL_COMMUNITY)
Admission: EM | Admit: 2018-12-24 | Discharge: 2018-12-27 | DRG: 918 | Disposition: A | Discharge status: Home / Self Care | Payer: Medicaid Other | Attending: Internal Medicine | Admitting: Internal Medicine

## 2018-12-24 ENCOUNTER — Other Ambulatory Visit: Payer: Self-pay

## 2018-12-24 DIAGNOSIS — R1013 Epigastric pain: Secondary | ICD-10-CM | POA: Diagnosis not present

## 2018-12-24 DIAGNOSIS — R45851 Suicidal ideations: Secondary | ICD-10-CM

## 2018-12-24 DIAGNOSIS — J449 Chronic obstructive pulmonary disease, unspecified: Secondary | ICD-10-CM | POA: Diagnosis present

## 2018-12-24 DIAGNOSIS — Z79899 Other long term (current) drug therapy: Secondary | ICD-10-CM

## 2018-12-24 DIAGNOSIS — Z8249 Family history of ischemic heart disease and other diseases of the circulatory system: Secondary | ICD-10-CM | POA: Diagnosis not present

## 2018-12-24 DIAGNOSIS — Z825 Family history of asthma and other chronic lower respiratory diseases: Secondary | ICD-10-CM

## 2018-12-24 DIAGNOSIS — T5492XA Toxic effect of unspecified corrosive substance, intentional self-harm, initial encounter: Secondary | ICD-10-CM | POA: Diagnosis present

## 2018-12-24 DIAGNOSIS — K449 Diaphragmatic hernia without obstruction or gangrene: Secondary | ICD-10-CM | POA: Diagnosis present

## 2018-12-24 DIAGNOSIS — R109 Unspecified abdominal pain: Secondary | ICD-10-CM | POA: Diagnosis present

## 2018-12-24 DIAGNOSIS — Z79891 Long term (current) use of opiate analgesic: Secondary | ICD-10-CM | POA: Diagnosis not present

## 2018-12-24 DIAGNOSIS — Z87891 Personal history of nicotine dependence: Secondary | ICD-10-CM | POA: Diagnosis not present

## 2018-12-24 DIAGNOSIS — E871 Hypo-osmolality and hyponatremia: Secondary | ICD-10-CM | POA: Diagnosis present

## 2018-12-24 DIAGNOSIS — M1712 Unilateral primary osteoarthritis, left knee: Secondary | ICD-10-CM | POA: Diagnosis present

## 2018-12-24 DIAGNOSIS — K228 Other specified diseases of esophagus: Secondary | ICD-10-CM | POA: Diagnosis not present

## 2018-12-24 DIAGNOSIS — Z888 Allergy status to other drugs, medicaments and biological substances status: Secondary | ICD-10-CM

## 2018-12-24 DIAGNOSIS — Z23 Encounter for immunization: Secondary | ICD-10-CM | POA: Diagnosis not present

## 2018-12-24 DIAGNOSIS — Z91013 Allergy to seafood: Secondary | ICD-10-CM

## 2018-12-24 DIAGNOSIS — K3189 Other diseases of stomach and duodenum: Secondary | ICD-10-CM | POA: Diagnosis not present

## 2018-12-24 DIAGNOSIS — Z7982 Long term (current) use of aspirin: Secondary | ICD-10-CM | POA: Diagnosis not present

## 2018-12-24 DIAGNOSIS — F329 Major depressive disorder, single episode, unspecified: Secondary | ICD-10-CM | POA: Diagnosis present

## 2018-12-24 DIAGNOSIS — T1491XA Suicide attempt, initial encounter: Secondary | ICD-10-CM | POA: Diagnosis present

## 2018-12-24 DIAGNOSIS — M549 Dorsalgia, unspecified: Secondary | ICD-10-CM | POA: Diagnosis present

## 2018-12-24 DIAGNOSIS — F419 Anxiety disorder, unspecified: Secondary | ICD-10-CM | POA: Diagnosis present

## 2018-12-24 DIAGNOSIS — K219 Gastro-esophageal reflux disease without esophagitis: Secondary | ICD-10-CM | POA: Diagnosis present

## 2018-12-24 DIAGNOSIS — G8929 Other chronic pain: Secondary | ICD-10-CM | POA: Diagnosis present

## 2018-12-24 DIAGNOSIS — Z20828 Contact with and (suspected) exposure to other viral communicable diseases: Secondary | ICD-10-CM | POA: Diagnosis present

## 2018-12-24 DIAGNOSIS — E876 Hypokalemia: Secondary | ICD-10-CM | POA: Diagnosis present

## 2018-12-24 DIAGNOSIS — I10 Essential (primary) hypertension: Secondary | ICD-10-CM | POA: Diagnosis present

## 2018-12-24 HISTORY — DX: Suicidal ideations: R45.851

## 2018-12-24 LAB — COMPREHENSIVE METABOLIC PANEL
ALT: 38 U/L (ref 0–44)
AST: 39 U/L (ref 15–41)
Albumin: 3.9 g/dL (ref 3.5–5.0)
Alkaline Phosphatase: 74 U/L (ref 38–126)
Anion gap: 15 (ref 5–15)
BUN: 6 mg/dL (ref 6–20)
CO2: 19 mmol/L — ABNORMAL LOW (ref 22–32)
Calcium: 8.9 mg/dL (ref 8.9–10.3)
Chloride: 100 mmol/L (ref 98–111)
Creatinine, Ser: 0.75 mg/dL (ref 0.44–1.00)
GFR calc Af Amer: >60 mL/min (ref >60)
GFR calc non Af Amer: >60 mL/min (ref >60)
Glucose, Bld: 84 mg/dL (ref 70–99)
Potassium: 3.1 mmol/L — ABNORMAL LOW (ref 3.5–5.1)
Sodium: 134 mmol/L — ABNORMAL LOW (ref 135–145)
Total Bilirubin: 0.7 mg/dL (ref 0.3–1.2)
Total Protein: 7.7 g/dL (ref 6.5–8.1)

## 2018-12-24 LAB — CBC WITH DIFFERENTIAL/PLATELET
Abs Immature Granulocytes: 0.03 10*3/uL (ref 0.00–0.07)
Basophils Absolute: 0 10*3/uL (ref 0.0–0.1)
Basophils Relative: 0 %
Eosinophils Absolute: 0.1 10*3/uL (ref 0.0–0.5)
Eosinophils Relative: 1 %
HCT: 36.5 % (ref 36.0–46.0)
Hemoglobin: 12.1 g/dL (ref 12.0–15.0)
Immature Granulocytes: 0 %
Lymphocytes Relative: 38 %
Lymphs Abs: 3 10*3/uL (ref 0.7–4.0)
MCH: 28.9 pg (ref 26.0–34.0)
MCHC: 33.2 g/dL (ref 30.0–36.0)
MCV: 87.1 fL (ref 80.0–100.0)
Monocytes Absolute: 0.6 10*3/uL (ref 0.1–1.0)
Monocytes Relative: 8 %
Neutro Abs: 4.2 10*3/uL (ref 1.7–7.7)
Neutrophils Relative %: 53 %
Platelets: 592 10*3/uL — ABNORMAL HIGH (ref 150–400)
RBC: 4.19 MIL/uL (ref 3.87–5.11)
RDW: 15.2 % (ref 11.5–15.5)
WBC: 8 10*3/uL (ref 4.0–10.5)
nRBC: 0 % (ref 0.0–0.2)

## 2018-12-24 LAB — I-STAT BETA HCG BLOOD, ED (MC, WL, AP ONLY): I-stat hCG, quantitative: <5 m[IU]/mL (ref <5)

## 2018-12-24 LAB — LIPASE, BLOOD: Lipase: 28 U/L (ref 11–51)

## 2018-12-24 LAB — SALICYLATE LEVEL: Salicylate Lvl: <7 mg/dL (ref 2.8–30.0)

## 2018-12-24 LAB — TSH: TSH: 1.533 u[IU]/mL (ref 0.350–4.500)

## 2018-12-24 LAB — SARS CORONAVIRUS 2 BY RT PCR (HOSPITAL ORDER, PERFORMED IN ~~LOC~~ HOSPITAL LAB): SARS Coronavirus 2: NEGATIVE

## 2018-12-24 LAB — ACETAMINOPHEN LEVEL: Acetaminophen (Tylenol), Serum: <10 ug/mL — ABNORMAL LOW (ref 10–30)

## 2018-12-24 LAB — ETHANOL: Alcohol, Ethyl (B): <10 mg/dL (ref <10)

## 2018-12-24 MED ORDER — ONDANSETRON HCL 4 MG/2ML IJ SOLN
4.0000 mg | Freq: Once | INTRAMUSCULAR | Status: AC
  Administered 2018-12-24: 4 mg via INTRAVENOUS
  Filled 2018-12-24: qty 2

## 2018-12-24 MED ORDER — HYDRALAZINE HCL 20 MG/ML IJ SOLN: 10.0000 mg | Freq: Four times a day (QID) | INTRAMUSCULAR | Status: DC | PRN

## 2018-12-24 MED ORDER — SODIUM CHLORIDE 0.9 % IV SOLN
INTRAVENOUS | Status: AC
  Administered 2018-12-24 – 2018-12-25 (×2): via INTRAVENOUS

## 2018-12-24 MED ORDER — FLUTICASONE PROPIONATE 50 MCG/ACT NA SUSP
1.0000 | Freq: Every day | NASAL | Status: DC
  Administered 2018-12-25 – 2018-12-27 (×3): 1 via NASAL
  Filled 2018-12-24 (×2): qty 16

## 2018-12-24 MED ORDER — ONDANSETRON HCL 4 MG/2ML IJ SOLN: 4.0000 mg | Freq: Four times a day (QID) | INTRAMUSCULAR | Status: DC | PRN

## 2018-12-24 MED ORDER — IOHEXOL 300 MG/ML  SOLN
100.0000 mL | Freq: Once | INTRAMUSCULAR | Status: AC | PRN
  Administered 2018-12-24: 100 mL via INTRAVENOUS

## 2018-12-24 MED ORDER — ALBUTEROL SULFATE (2.5 MG/3ML) 0.083% IN NEBU: 2.5000 mg | INHALATION_SOLUTION | Freq: Four times a day (QID) | RESPIRATORY_TRACT | Status: DC | PRN

## 2018-12-24 MED ORDER — ONDANSETRON HCL 4 MG PO TABS: 4.0000 mg | ORAL_TABLET | Freq: Four times a day (QID) | ORAL | Status: DC | PRN

## 2018-12-24 MED ORDER — ALBUTEROL SULFATE (2.5 MG/3ML) 0.083% IN NEBU: 2.5000 mg | INHALATION_SOLUTION | Freq: Two times a day (BID) | RESPIRATORY_TRACT | Status: DC

## 2018-12-24 MED ORDER — FENTANYL CITRATE (PF) 100 MCG/2ML IJ SOLN
50.0000 ug | Freq: Once | INTRAMUSCULAR | Status: AC
  Administered 2018-12-24: 50 ug via INTRAVENOUS
  Filled 2018-12-24: qty 2

## 2018-12-24 MED ORDER — INFLUENZA VAC SPLIT QUAD 0.5 ML IM SUSY
0.5000 mL | PREFILLED_SYRINGE | INTRAMUSCULAR | Status: AC
  Administered 2018-12-26: 13:00:00 0.5 mL via INTRAMUSCULAR
  Filled 2018-12-24: qty 0.5

## 2018-12-24 MED ORDER — ALBUTEROL SULFATE (2.5 MG/3ML) 0.083% IN NEBU
2.5000 mg | INHALATION_SOLUTION | Freq: Two times a day (BID) | RESPIRATORY_TRACT | Status: DC
  Administered 2018-12-25 – 2018-12-27 (×5): 2.5 mg via RESPIRATORY_TRACT
  Filled 2018-12-24 (×5): qty 3

## 2018-12-24 MED ORDER — SODIUM CHLORIDE 0.9 % IV BOLUS
1000.0000 mL | Freq: Once | INTRAVENOUS | Status: AC
  Administered 2018-12-24: 1000 mL via INTRAVENOUS

## 2018-12-24 MED ORDER — PANTOPRAZOLE SODIUM 40 MG IV SOLR
40.0000 mg | Freq: Once | INTRAVENOUS | Status: AC
  Administered 2018-12-24: 40 mg via INTRAVENOUS
  Filled 2018-12-24: qty 40

## 2018-12-24 MED ORDER — INFLUENZA VAC SPLIT QUAD 0.5 ML IM SUSY: 0.5000 mL | PREFILLED_SYRINGE | INTRAMUSCULAR | Status: DC

## 2018-12-24 MED ORDER — POTASSIUM CHLORIDE 10 MEQ/100ML IV SOLN
10.0000 meq | INTRAVENOUS | Status: AC
  Administered 2018-12-25 (×4): 10 meq via INTRAVENOUS
  Filled 2018-12-24 (×3): qty 100
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
## 2018-12-24 NOTE — ED Notes (Signed)
ED TO INPATIENT HANDOFF REPORT  ED Nurse Name and Phone #: Rosalie Gums, RN [phone] S Name/Age/Gender Kathryn [name] 48 y.o. female Room/Bed: APA05/APA05  Code Status   Code Status: Prior  Home/SNF/Other Home Patient oriented to: self, place, time and situation Is this baseline? Yes   Triage Complete: Triage complete  Chief Complaint si  Triage Note Patient states she does not want to live. 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 Patient states her grandchildren have been taken from her due to her drug use.  She states she has a plan to drink bleach and vinegar.   Patient states she drank bleach this morning at 11:30 am. Patient is retching and vomiting in triage. She complains of stomach.   Allergies Allergies  Allergen Reactions  . Clonopin [Clonazepam] Anaphylaxis    Swelling of the tongue and mouth.   . Fish Allergy Anaphylaxis, Shortness Of Breath and Swelling  . Flexeril [Cyclobenzaprine Hcl] Shortness Of Breath  . Ibuprofen Anaphylaxis and Hives  . Shellfish Allergy Anaphylaxis  . Ace Inhibitors Cough  . Tramadol Nausea And Vomiting    Upset stomach    Level of Care/Admitting Diagnosis ED Disposition    ED Disposition Condition [facility] Area: [facility] [id number]  Level of Care: Telemetry [5]  Covid Evaluation: Asymptomatic Screening Protocol (No Symptoms)  Diagnosis: Abdominal pain [id number]  Admitting Physician: Rolla Plate [id number]  Attending Physician: Rolla Plate [id number]  Estimated length of stay: 3 - 4 days  Certification:: I certify this patient will need inpatient services for at least 2 midnights  PT Class (Do Not Modify): Inpatient [101]  PT Acc Code (Do Not Modify): Private [1]       B Medical/Surgery History Past Medical History:  Diagnosis Date  . Anxiety   . Arthritis   . Asthma   . Chronic back pain   . COPD (chronic obstructive pulmonary disease) (HCC)    Chronic  bronchitis  . Depression   . GERD (gastroesophageal reflux disease)   . Hypertension   . Neuropathy   . Osteoarthritis of left knee, 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   Past Surgical History:  Procedure Laterality Date  . PATELLA-FEMORAL ARTHROPLASTY Left 10/08/2018   Procedure: PATELLA-FEMORAL ARTHROPLASTY;  Surgeon: Marchia Bond, MD;  Location: [facility]S;  Service: Orthopedics;  Laterality: Left;  . TUBAL LIGATION       A IV Location/Drains/Wounds Patient Lines/Drains/Airways Status   Active Line/Drains/Airways    Name:   Placement date:   Placement time:   Site:   Days:   Peripheral IV 12/24/18 Right Hand   12/24/18    1500    Hand   less than 1   Incision (Closed) 10/08/18 Knee Left   10/08/18    1223     77          Intake/Output Last 24 hours No intake or output data in the 24 hours ending 12/24/18 2224  Labs/Imaging Results for orders placed or performed during the hospital encounter of 12/24/18 (from the past 48 hour(s))  Comprehensive metabolic panel     Status: Abnormal   Collection Time: 12/24/18  3:00 PM  Result Value Ref Range   Sodium 134 (L) 135 - 145 mmol/L   Potassium 3.1 (L) 3.5 - 5.1 mmol/L   Chloride 100 98 - 111 mmol/L   CO2 19 (L) 22 - 32 mmol/L   Glucose, Bld 84 70 - 99 mg/dL   BUN  6 6 - 20 mg/dL   Creatinine, Ser 0.75 0.44 - 1.00 mg/dL   Calcium 8.9 8.9 - 10.3 mg/dL   Total Protein 7.7 6.5 - 8.1 g/dL   Albumin 3.9 3.5 - 5.0 g/dL   AST 39 15 - 41 U/L   ALT 38 0 - 44 U/L   Alkaline Phosphatase 74 38 - 126 U/L   Total Bilirubin 0.7 0.3 - 1.2 mg/dL   GFR calc non Af Amer >60 >60 mL/min   GFR calc Af Amer >60 >60 mL/min   Anion gap 15 5 - 15    Comment: Performed at [facility], [address], [place], [place] [address]  Ethanol     Status: None   Collection Time: 12/24/18  3:00 PM  Result Value Ref Range   Alcohol, Ethyl (B) <10 <10 mg/dL    Comment: (NOTE) Lowest detectable limit for serum alcohol is 10 mg/dL. For medical purposes only. Performed  at [facility], [address] [place], [place] [id number]   Salicylate level     Status: None   Collection Time: 12/24/18  3:00 PM  Result Value Ref Range   Salicylate Lvl [id number] 2.8 - 30.0 mg/dL    Comment: Performed at [facility], [address] [place], [place] [address]  Acetaminophen level     Status: Abnormal   Collection Time: 12/24/18  3:00 PM  Result Value Ref Range   Acetaminophen (Tylenol), Serum <10 (L) 10 - 30 ug/mL    Comment: (NOTE) Therapeutic concentrations vary significantly. A range of 10-30 ug/mL  may be an effective concentration for many patients. However, some  are best treated at concentrations outside of this range. Acetaminophen concentrations >150 ug/mL at 4 hours after ingestion  and >50 ug/mL at 12 hours after ingestion are often associated with  toxic reactions. Performed at [facility], [address], [place], [place] [address]   CBC with Differential     Status: Abnormal   Collection Time: 12/24/18  3:00 PM  Result Value Ref Range   WBC 8.0 4.0 - 10.5 K/uL   RBC 4.19 3.87 - 5.11 MIL/uL   Hemoglobin 12.1 12.0 - 15.0 g/dL   HCT 36.5 36.0 - 46.0 %   MCV 87.1 80.0 - 100.0 fL   MCH 28.9 26.0 - 34.0 pg   MCHC 33.2 30.0 - 36.0 g/dL   RDW 15.2 11.5 - 15.5 %   Platelets 592 (H) 150 - 400 K/uL   nRBC 0.0 0.0 - 0.2 %   Neutrophils Relative % 53 %   Neutro Abs 4.2 1.7 - 7.7 K/uL   Lymphocytes Relative 38 %   Lymphs Abs 3.0 0.7 - 4.0 K/uL   Monocytes Relative 8 %   Monocytes Absolute 0.6 0.1 - 1.0 K/uL   Eosinophils Relative 1 %   Eosinophils Absolute 0.1 0.0 - 0.5 K/uL   Basophils Relative 0 %   Basophils Absolute 0.0 0.0 - 0.1 K/uL   Immature Granulocytes 0 %   Abs Immature Granulocytes 0.03 0.00 - 0.07 K/uL    Comment: Performed at [facility], [address], [place],  [address]  Lipase, blood     Status: None   Collection Time: 12/24/18  3:00 PM  Result Value Ref Range   Lipase 28 11 - 51 U/L    Comment: Performed at [facility], [address], [place],  [address]  I-Stat beta hCG blood, ED     Status: None   Collection Time: 12/24/18  5:11 PM  Result Value Ref Range   I-stat hCG, quantitative <5.0 <5 mIU/mL   Comment 3            Comment:   GEST. AGE      CONC.  (mIU/mL)   <=1 WEEK        5 - 50     2 WEEKS       50 - 500     3 WEEKS       100 - 10,000     4 WEEKS     1,000 - 30,000        FEMALE AND NON-PREGNANT FEMALE:     LESS THAN 5 mIU/mL   SARS Coronavirus 2 [facility] order, Performed in [facility] hospital lab) Nasopharyngeal Nasopharyngeal Swab     Status: None   Collection Time: 12/24/18  7:46 PM   Specimen: Nasopharyngeal Swab  Result Value Ref Range   SARS Coronavirus 2 NEGATIVE NEGATIVE    Comment: (NOTE) If result is NEGATIVE SARS-CoV-2 target nucleic acids are NOT DETECTED. The SARS-CoV-2 RNA is generally detectable in upper and lower  respiratory specimens during the acute phase of infection. The lowest  concentration of SARS-CoV-2 viral copies this assay can detect is 250  copies / mL. A negative result does not preclude SARS-CoV-2 infection  and should not be used as the sole basis for treatment or other  patient management decisions.  A negative result may occur with  improper specimen collection / handling, submission of specimen other  than nasopharyngeal swab, presence of viral mutation(s) within the  areas targeted by this assay, and inadequate number of viral copies  (<250 copies / mL). A negative result must be combined with clinical  observations, patient history, and epidemiological information. If result is POSITIVE SARS-CoV-2 target nucleic acids are DETECTED. The SARS-CoV-2 RNA is generally detectable in upper and lower  respiratory specimens dur ing the acute phase of infection.  Positive  results are indicative of active infection with SARS-CoV-2.  Clinical  correlation with patient history and other diagnostic information is  necessary to determine patient  infection status.  Positive results do  not rule out bacterial infection or co-infection with other viruses. If result is PRESUMPTIVE POSTIVE SARS-CoV-2 nucleic acids MAY BE PRESENT.   A presumptive positive result was obtained on the submitted specimen  and confirmed on repeat testing.  While 2019 novel coronavirus  (SARS-CoV-2) nucleic acids may be present in the submitted sample  additional confirmatory testing may be necessary for epidemiological  and / or clinical management purposes  to differentiate between  SARS-CoV-2 and other Sarbecovirus currently known to infect humans.  If clinically indicated additional testing with an alternate test  methodology [phone]) is advised. The SARS-CoV-2 RNA is generally  detectable in upper and lower respiratory sp ecimens during the acute  phase of infection. The expected result is Negative. Fact Sheet for Patients:  StrictlyIdeas.no Fact Sheet for Healthcare Providers: BankingDealers.co.za This test is not yet approved or cleared by the [place] FDA and has been authorized for detection and/or diagnosis of SARS-CoV-2 by FDA under an Emergency Use Authorization (EUA).  This EUA will remain in effect (meaning this test can be used) for the duration of the COVID-19 declaration under Section 564(b)(1) of the Act, 21 U.S.C. section 360bbb-3(b)(1), unless the authorization is terminated or revoked sooner. Performed at [facility], [address] [place], [place] [address]    Ct Chest W Contrast  Result Date: 12/24/2018 CLINICAL  DATA:  Ingestion bleach.  Epigastric pain. EXAM: CT CHEST, ABDOMEN, AND PELVIS WITH CONTRAST TECHNIQUE: Multidetector CT imaging of the chest, abdomen and pelvis was performed following the standard protocol during bolus administration of intravenous contrast. CONTRAST:  174mL OMNIPAQUE IOHEXOL 300 MG/ML  SOLN COMPARISON:  CT abdomen and pelvis 03/21/2018.  CTA chest  05/10/2018 FINDINGS: CT CHEST FINDINGS Cardiovascular: Heart is normal size. Aorta is normal caliber. Mediastinum/Nodes: No mediastinal, hilar, or axillary adenopathy. Trachea and esophagus are unremarkable. Thyroid unremarkable. Lungs/Pleura: Lungs are clear. No focal airspace opacities or suspicious nodules. No effusions. Musculoskeletal: Chest wall soft tissues are unremarkable. No acute bony abnormality. CT ABDOMEN PELVIS FINDINGS Hepatobiliary: Diffuse low-density throughout the liver compatible with fatty infiltration. No focal abnormality. Gallbladder unremarkable. Pancreas: No focal abnormality or ductal dilatation. Spleen: No focal abnormality.  Normal size. Adrenals/Urinary Tract: No adrenal abnormality. No focal renal abnormality. No stones or hydronephrosis. Urinary bladder is unremarkable. Stomach/Bowel: Stomach is unremarkable. Large and small bowel decompressed, unremarkable. No evidence of perforation. Appendix normal. Vascular/Lymphatic: No evidence of aneurysm or adenopathy. Reproductive: Uterus and adnexa unremarkable.  No mass. Other: No free fluid or free air. Musculoskeletal: No acute bony abnormality. Degenerative changes in the lumbar spine. IMPRESSION: No acute findings in the chest, abdomen or pelvis. Fatty infiltration of the liver. Electronically Signed   By: Rolm Baptise M.D.   On: 12/24/2018 19:21   Ct Abdomen Pelvis W Contrast  Result Date: 12/24/2018 CLINICAL DATA:  Ingestion bleach.  Epigastric pain. EXAM: CT CHEST, ABDOMEN, AND PELVIS WITH CONTRAST TECHNIQUE: Multidetector CT imaging of the chest, abdomen and pelvis was performed following the standard protocol during bolus administration of intravenous contrast. CONTRAST:  13mL OMNIPAQUE IOHEXOL 300 MG/ML  SOLN COMPARISON:  CT abdomen and pelvis 03/21/2018.  CTA chest 05/10/2018 FINDINGS: CT CHEST FINDINGS Cardiovascular: Heart is normal size. Aorta is normal caliber. Mediastinum/Nodes: No mediastinal, hilar, or axillary  adenopathy. Trachea and esophagus are unremarkable. Thyroid unremarkable. Lungs/Pleura: Lungs are clear. No focal airspace opacities or suspicious nodules. No effusions. Musculoskeletal: Chest wall soft tissues are unremarkable. No acute bony abnormality. CT ABDOMEN PELVIS FINDINGS Hepatobiliary: Diffuse low-density throughout the liver compatible with fatty infiltration. No focal abnormality. Gallbladder unremarkable. Pancreas: No focal abnormality or ductal dilatation. Spleen: No focal abnormality.  Normal size. Adrenals/Urinary Tract: No adrenal abnormality. No focal renal abnormality. No stones or hydronephrosis. Urinary bladder is unremarkable. Stomach/Bowel: Stomach is unremarkable. Large and small bowel decompressed, unremarkable. No evidence of perforation. Appendix normal. Vascular/Lymphatic: No evidence of aneurysm or adenopathy. Reproductive: Uterus and adnexa unremarkable.  No mass. Other: No free fluid or free air. Musculoskeletal: No acute bony abnormality. Degenerative changes in the lumbar spine. IMPRESSION: No acute findings in the chest, abdomen or pelvis. Fatty infiltration of the liver. Electronically Signed   By: Rolm Baptise M.D.   On: 12/24/2018 19:21    Pending Labs Unresulted Labs (From admission, onward)    Start     Ordered   12/24/18 1534  Urine rapid drug screen (hosp performed)  ONCE - STAT,   STAT     12/24/18 1533   12/24/18 1533  Urinalysis, Routine w reflex microscopic  ONCE - STAT,   STAT     12/24/18 1533   Signed and Held  HIV antibody (Routine Testing)  Once,   R     Signed and Held   Signed and Held  TSH  Once,   R     Signed and Held   Signed and Held  Occult  blood card to lab, stool RN will collect  Once,   R    Question:  Specimen to be collected by:  Answer:  RN will collect   Signed and Held   Signed and Held  Comprehensive metabolic panel  Tomorrow morning,   R     Signed and Held   Signed and Held  CBC  Tomorrow morning,   R     Signed and Held           Vitals/Pain Today's Vitals   12/24/18 2007 12/24/18 2030 12/24/18 2100 12/24/18 2201  BP:  118/66 125/84   Pulse:      Resp:  (!) 41 16   SpO2:      Weight:      Height:      PainSc: 10-Worst pain ever   6     Isolation Precautions No active isolations  Medications Medications  influenza vac split quadrivalent PF (FLUARIX) injection 0.5 mL (has no administration in time range)  sodium chloride 0.9 % bolus 1,000 mL (0 mLs Intravenous Stopped 12/24/18 1651)  pantoprazole (PROTONIX) injection 40 mg (40 mg Intravenous Given 12/24/18 1546)  ondansetron (ZOFRAN) injection 4 mg (4 mg Intravenous Given 12/24/18 1546)  fentaNYL (SUBLIMAZE) injection 50 mcg (50 mcg Intravenous Given 12/24/18 1602)  iohexol (OMNIPAQUE) 300 MG/ML solution 100 mL (100 mLs Intravenous Contrast Given 12/24/18 1842)  fentaNYL (SUBLIMAZE) injection 50 mcg (50 mcg Intravenous Given 12/24/18 2004)    Mobility walks Low fall risk   Focused Assessments    R Recommendations: See Admitting Provider Note  Report given to:   Additional Notes:

## 2018-12-24 NOTE — ED Triage Notes (Signed)
Patient states she does not want to live. 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 Patient states her grandchildren have been taken from her due to her drug use.  She states she has a plan to drink bleach and vinegar.   Patient states she drank bleach this morning at 11:30 am. Patient is retching and vomiting in triage. She complains of stomach.

## 2018-12-24 NOTE — Plan of Care (Signed)
Pt reports swallowing HOUSEHOLD BLEACH/VINEGAR. C/O EPIGASTRIC PAIN. RECOMMEND CT ABD W/ ORAL OR IV CONTRAST TO EVALUATE FOR PERFORATION. STRICT NPO. PLAN EGD WITH NUR SEP 9. FULL CONSULT TO FOLLOW.

## 2018-12-24 NOTE — ED Provider Notes (Signed)
[facility] EMERGENCY DEPARTMENT Provider Note   CSN: [id number] Arrival date & time: 12/24/18  1330     History   Chief Complaint Chief Complaint  Patient presents with   Suicidal    HPI NAHLAH SIVILS is a 48 y.o. female with a hx of COPD, GERD, HTN, & depression who presents to the ED s/p suicide attempt @ 11:30. Patient states she has been feeling depressed with thoughts of suicide and auditory hallucinations telling her to harm herself for "awhile" now. This AM she drank approximately 2-3 ounces of bleach mixed with vinegar w/ intent to kill herself. She quickly developed upper abdominal pain described as burning that radiates into her throat that has been constant since onset, severe, no alleviating/aggravating factors. Has had associated nausea w/ 3-4 episodes of non bloody emesis. Denies fever, chills, hematemesis, melena, hematochezia, diarrhea, constipation, or dyspnea. Denies co-ingestion.      HPI  Past Medical History:  Diagnosis Date   Anxiety    Arthritis    Asthma    Chronic back pain    COPD (chronic obstructive pulmonary disease) (HCC)    Chronic bronchitis   Depression    GERD (gastroesophageal reflux disease)    Hypertension    Neuropathy    Osteoarthritis of left knee, 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    Patient Active Problem List   Diagnosis Date Noted   Patellofemoral arthritis of left knee 10/08/2018   Enlarged uterus 02/20/2018   Screening for colorectal cancer 02/20/2018   Routine cervical smear 02/20/2018   Encounter for gynecological examination with Papanicolaou smear of cervix 02/20/2018   Osteoarthritis of left knee, 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   MDD (major depressive disorder), recurrent episode, severe ([place]) 12/24/2017   Lumbar back pain 05/11/2017   Depression, major, single episode, moderate ([place]) 05/11/2017   Low vitamin B12 level 05/11/2017   Immunization due 05/11/2017   Hypertension goal BP (blood pressure) <  140/90 01/11/2017    Past Surgical History:  Procedure Laterality Date   PATELLA-FEMORAL ARTHROPLASTY Left 10/08/2018   Procedure: PATELLA-FEMORAL ARTHROPLASTY;  Surgeon: Marchia Bond, MD;  Location: [facility];  Service: Orthopedics;  Laterality: Left;   TUBAL LIGATION       OB History    Gravida  5   Para  3   Term  2   Preterm  1   AB  2   Living  3     SAB  1   TAB  1   Ectopic      Multiple      Live Births               Home Medications    Prior to Admission medications   Medication Sig Start Date End Date Taking? Authorizing Provider  albuterol (PROVENTIL HFA;VENTOLIN HFA) 108 (90 Base) MCG/ACT inhaler Inhale 2 puffs into the lungs 2 (two) times daily.    [provider]  albuterol (PROVENTIL) (2.5 MG/3ML) 0.083% nebulizer solution Take 2.5 mg by nebulization every 6 (six) hours as needed for wheezing or shortness of breath.    [provider]  amLODipine (NORVASC) 5 MG tablet Take 5 mg by mouth daily.    [provider]  aspirin EC 325 MG tablet Take 1 tablet (325 mg total) by mouth 2 (two) times daily. 10/08/18   Marchia Bond, MD  benzonatate (TESSALON) 200 MG capsule Take 1 capsule (200 mg total) by mouth 3 (three) times daily as needed for cough. Swallow whole, do not chew Patient taking differently:  Take 200 mg by mouth 3 (three) times daily. Swallow whole, do not chew 09/18/18   Triplett, Tammy, PA-C  cholecalciferol (VITAMIN D3) 25 MCG (1000 UT) tablet Take 2,000 Units by mouth daily.    [provider]  citalopram (CELEXA) 20 MG tablet Take 20 mg by mouth daily.    [provider]  famotidine (PEPCID) 20 MG tablet Take 1 tablet (20 mg total) by mouth 2 (two) times daily. 02/06/18   Virgel Manifold, MD  fluticasone (FLONASE) 50 MCG/ACT nasal spray Place 1 spray into both nostrils daily.    [provider]  gabapentin (NEURONTIN) 300 MG capsule Take 1 capsule (300 mg total) by mouth 3 (three) times  daily. 12/28/17   Starkes-Perry, Gayland Curry, FNP  hydrochlorothiazide (MICROZIDE) 12.5 MG capsule Take 12.5 mg by mouth daily.    [provider]  loratadine (CLARITIN) 10 MG tablet Take 10 mg by mouth daily.     [provider]  methocarbamol (ROBAXIN) 750 MG tablet Take 1 tablet (750 mg total) by mouth 3 (three) times daily as needed for muscle spasms. 10/08/18   Marchia Bond, MD  mirtazapine (REMERON) 30 MG tablet Take 30 mg by mouth at bedtime.    [provider]  omeprazole (PRILOSEC) 20 MG capsule Take 1 po BID x 2 weeks then once a day Patient taking differently: Take 20 mg by mouth daily.  03/24/18   Rolland Porter, MD  ondansetron (ZOFRAN) 4 MG tablet Take 1 tablet (4 mg total) by mouth every 8 (eight) hours as needed for nausea or vomiting. 10/08/18   Marchia Bond, MD  oxyCODONE (ROXICODONE) 5 MG immediate release tablet Take 1 tablet (5 mg total) by mouth every 4 (four) hours as needed for severe pain. 10/08/18   Marchia Bond, MD  potassium chloride SA (K-DUR,KLOR-CON) 20 MEQ tablet Take 1 tablet (20 mEq total) by mouth 2 (two) times daily. 04/19/18   Evalee Jefferson, PA-C  sennosides-docusate sodium (SENOKOT-S) 8.6-50 MG tablet Take 2 tablets by mouth daily. 10/08/18   Marchia Bond, MD  traZODone (DESYREL) 150 MG tablet Take 150-300 mg by mouth at bedtime as needed for sleep.    [provider]    Family History Family History  Problem Relation Age of Onset   Gout Paternal Grandfather    Cirrhosis Paternal Grandfather    Hypertension Paternal Grandmother    Aneurysm Paternal Grandmother    Cirrhosis Maternal Grandmother    Cirrhosis Maternal Grandfather    Cancer Father    Cirrhosis Father    Cirrhosis Mother    Breast cancer Sister    Hypertension Sister    Bronchitis Daughter    Bronchitis Daughter    Asthma Son    Bronchitis Son    Migraines Neg Hx     Social History Social History   Tobacco Use   Smoking status: Former  Smoker    Types: Cigarettes    Quit date: 2016    Years since quitting: 4.6   Smokeless tobacco: Never Used   Tobacco comment: wears nicotine patches  Substance Use Topics   Alcohol use: Yes    Comment: occ   Drug use: Not Currently    Types: Cocaine    Comment: crack  last used 2016     Allergies   Clonopin [clonazepam], Fish allergy, Flexeril [cyclobenzaprine hcl], Ibuprofen, Shellfish allergy, Ace inhibitors, and Tramadol   Review of Systems Review of Systems  Constitutional: Negative for chills and fever.  Respiratory: Negative for  shortness of breath.   Gastrointestinal: Positive for abdominal pain, nausea and vomiting. Negative for blood in stool, constipation and diarrhea.  Neurological: Negative for syncope.  Psychiatric/Behavioral: Positive for hallucinations, self-injury and suicidal ideas.  All other systems reviewed and are negative.    Physical Exam Updated Vital Signs BP (!) 142/96 (BP Location: Left Arm)    Pulse (!) 104    Resp 19    Ht 4\' 11"  (1.499 m)    Wt 78 kg    LMP 12/24/2018    SpO2 98%    BMI 34.74 kg/m   Physical Exam Vitals signs and nursing note reviewed.  Constitutional:      General: She is in acute distress.     Appearance: She is well-developed. She is not toxic-appearing. Diaphoretic: mild tearful.  HENT:     Head: Normocephalic and atraumatic.  Eyes:     General:        Right eye: No discharge.        Left eye: No discharge.     Conjunctiva/sclera: Conjunctivae normal.  Neck:     Musculoskeletal: Neck supple.  Cardiovascular:     Rate and Rhythm: Normal rate and regular rhythm.  Pulmonary:     Effort: Pulmonary effort is normal. No respiratory distress.     Breath sounds: Normal breath sounds. No wheezing, rhonchi or rales.  Abdominal:     General: There is no distension.     Palpations: Abdomen is soft.     Tenderness: There is abdominal tenderness (upper abdomen). There is no guarding or rebound.  Skin:    General:  Skin is warm and dry.     Findings: No rash.  Neurological:     Mental Status: She is alert.     Comments: Clear speech.   Psychiatric:        Thought Content: Thought content includes suicidal ideation. Thought content does not include homicidal ideation. Thought content includes suicidal plan. Thought content does not include homicidal plan.    ED Treatments / Results  Labs (all labs ordered are listed, but only abnormal results are displayed) Labs Reviewed  COMPREHENSIVE METABOLIC PANEL - Abnormal; Notable for the following components:      Result Value   Sodium 134 (*)    Potassium 3.1 (*)    CO2 19 (*)    All other components within normal limits  ACETAMINOPHEN LEVEL - Abnormal; Notable for the following components:   Acetaminophen (Tylenol), Serum <10 (*)    All other components within normal limits  CBC WITH DIFFERENTIAL/PLATELET - Abnormal; Notable for the following components:   Platelets 592 (*)    All other components within normal limits  SARS CORONAVIRUS 2 (HOSPITAL ORDER, [place] [facility])  ETHANOL  SALICYLATE LEVEL  LIPASE, BLOOD  URINALYSIS, ROUTINE W REFLEX MICROSCOPIC  RAPID URINE DRUG SCREEN, HOSP PERFORMED  I-STAT BETA HCG BLOOD, ED ([facility], [facility], [facility] ONLY)    EKG None  Radiology Ct Chest W Contrast  Result Date: 12/24/2018 CLINICAL DATA:  Ingestion bleach.  Epigastric pain. EXAM: CT CHEST, ABDOMEN, AND PELVIS WITH CONTRAST TECHNIQUE: Multidetector CT imaging of the chest, abdomen and pelvis was performed following the standard protocol during bolus administration of intravenous contrast. CONTRAST:  178mL OMNIPAQUE IOHEXOL 300 MG/ML  SOLN COMPARISON:  CT abdomen and pelvis 03/21/2018.  CTA chest 05/10/2018 FINDINGS: CT CHEST FINDINGS Cardiovascular: Heart is normal size. Aorta is normal caliber. Mediastinum/Nodes: No mediastinal, hilar, or axillary adenopathy. Trachea and esophagus are  unremarkable. Thyroid unremarkable. Lungs/Pleura: Lungs are  clear. No focal airspace opacities or suspicious nodules. No effusions. Musculoskeletal: Chest wall soft tissues are unremarkable. No acute bony abnormality. CT ABDOMEN PELVIS FINDINGS Hepatobiliary: Diffuse low-density throughout the liver compatible with fatty infiltration. No focal abnormality. Gallbladder unremarkable. Pancreas: No focal abnormality or ductal dilatation. Spleen: No focal abnormality.  Normal size. Adrenals/Urinary Tract: No adrenal abnormality. No focal renal abnormality. No stones or hydronephrosis. Urinary bladder is unremarkable. Stomach/Bowel: Stomach is unremarkable. Large and small bowel decompressed, unremarkable. No evidence of perforation. Appendix normal. Vascular/Lymphatic: No evidence of aneurysm or adenopathy. Reproductive: Uterus and adnexa unremarkable.  No mass. Other: No free fluid or free air. Musculoskeletal: No acute bony abnormality. Degenerative changes in the lumbar spine. IMPRESSION: No acute findings in the chest, abdomen or pelvis. Fatty infiltration of the liver. Electronically Signed   By: Rolm Baptise M.D.   On: 12/24/2018 19:21   Ct Abdomen Pelvis W Contrast  Result Date: 12/24/2018 CLINICAL DATA:  Ingestion bleach.  Epigastric pain. EXAM: CT CHEST, ABDOMEN, AND PELVIS WITH CONTRAST TECHNIQUE: Multidetector CT imaging of the chest, abdomen and pelvis was performed following the standard protocol during bolus administration of intravenous contrast. CONTRAST:  133mL OMNIPAQUE IOHEXOL 300 MG/ML  SOLN COMPARISON:  CT abdomen and pelvis 03/21/2018.  CTA chest 05/10/2018 FINDINGS: CT CHEST FINDINGS Cardiovascular: Heart is normal size. Aorta is normal caliber. Mediastinum/Nodes: No mediastinal, hilar, or axillary adenopathy. Trachea and esophagus are unremarkable. Thyroid unremarkable. Lungs/Pleura: Lungs are clear. No focal airspace opacities or suspicious nodules. No effusions. Musculoskeletal: Chest wall soft tissues are unremarkable. No acute bony abnormality. CT  ABDOMEN PELVIS FINDINGS Hepatobiliary: Diffuse low-density throughout the liver compatible with fatty infiltration. No focal abnormality. Gallbladder unremarkable. Pancreas: No focal abnormality or ductal dilatation. Spleen: No focal abnormality.  Normal size. Adrenals/Urinary Tract: No adrenal abnormality. No focal renal abnormality. No stones or hydronephrosis. Urinary bladder is unremarkable. Stomach/Bowel: Stomach is unremarkable. Large and small bowel decompressed, unremarkable. No evidence of perforation. Appendix normal. Vascular/Lymphatic: No evidence of aneurysm or adenopathy. Reproductive: Uterus and adnexa unremarkable.  No mass. Other: No free fluid or free air. Musculoskeletal: No acute bony abnormality. Degenerative changes in the lumbar spine. IMPRESSION: No acute findings in the chest, abdomen or pelvis. Fatty infiltration of the liver. Electronically Signed   By: Rolm Baptise M.D.   On: 12/24/2018 19:21    Procedures .Critical Care Performed by: Amaryllis Dyke, PA-C Authorized by: Amaryllis Dyke, PA-C    CRITICAL CARE Performed by: Kennith Maes   Total critical care time: 30 minutes  Critical care time was exclusive of separately billable procedures and treating other patients.  Critical care was necessary to treat or prevent imminent or life-threatening deterioration.  Critical care was time spent personally by me on the following activities: development of treatment plan with patient and/or surrogate as well as nursing, discussions with consultants, evaluation of patient's response to treatment, examination of patient, obtaining history from patient or surrogate, ordering and performing treatments and interventions, ordering and review of laboratory studies, ordering and review of radiographic studies, pulse oximetry and re-evaluation of patient's condition.  (including critical care time)  Medications Ordered in ED Medications  fentaNYL (SUBLIMAZE)  injection 50 mcg (has no administration in time range)  sodium chloride 0.9 % bolus 1,000 mL (0 mLs Intravenous Stopped 12/24/18 1651)  pantoprazole (PROTONIX) injection 40 mg (40 mg Intravenous Given 12/24/18 1546)  ondansetron (ZOFRAN) injection 4 mg (4 mg Intravenous Given 12/24/18 1546)  fentaNYL (SUBLIMAZE)  injection 50 mcg (50 mcg Intravenous Given 12/24/18 1602)  iohexol (OMNIPAQUE) 300 MG/ML solution 100 mL (100 mLs Intravenous Contrast Given 12/24/18 1842)     Initial Impression / Assessment and Plan / ED Course  I have reviewed the triage vital signs and the nursing notes.  Pertinent labs & imaging results that were available during my care of the patient were reviewed by me and considered in my medical decision making (see chart for details).   Patient presents s/p suicide attempt by drinking bleach/vinegar @ 11:30 this AM, now w/ epigastric pain & 3-4 episodes of emesis. Nontoxic, appears uncomfortable, vitals w/ initial tachycardia which normalized on my exam. Abdomen w/ upper tenderness no peritoneal signs.   15:33: CONSULT: Spoke with poison control: Recommendation for basic labs, acetaminophen level, EKG, IVFs, & symptomatic management- they have also recommended GI consultation for EGD given possible caustic injury. Orders carried out per discussion w/ poison control. Protonix, fentanyl, & 1L IVFs ordered.   CBC: No leukocytosis or anemia. Platelets elevated.  CMP: Mild hypokalemia/hyponatremia. Bicarb mildly decreased. LFTs & renal function WNL Lipase WNL Ethanol/acetaminophen/salicylate: WNL Preg test: Negative UA/UDS: Pending  16:30: CONSULT: Discussed case with gastroenterologist Dr. Oneida Alar- recommends CT imaging, admit for 24 hours observation, GI will see patient tomorrow AM, patient to be NPO.   CT chest/A/P ordered- No acute findings, fatty infiltrate of the liver   Patient remains w/ stomach discomfort, requesting additional pain medicine which has been ordered. Will  consult for admission per discussion w/ GI team.   20:00: CONSULT: Discussed with hospitalist Dr. Clearence Ped- accepts admission.   Findings and plan of care discussed with supervising physician Dr. Wilson Singer who is in agreement.   LAVELL AVEY was evaluated in Emergency Department on 12/24/2018 for the symptoms described in the history of present illness. He/she was evaluated in the context of the global COVID-19 pandemic, which necessitated consideration that the patient might be at risk for infection with the SARS-CoV-2 virus that causes COVID-19. Institutional protocols and algorithms that pertain to the evaluation of patients at risk for COVID-19 are in a state of rapid change based on information released by regulatory bodies including the CDC and federal and state organizations. These policies and algorithms were followed during the patient's care in the ED.   Final Clinical Impressions(s) / ED Diagnoses   Final diagnoses:  Suicide attempt [facility])  Ingestion of bleach, intentional self-harm, initial encounter [facility])    ED Discharge Orders    None       Leafy Kindle 12/24/18 2011    Virgel Manifold, MD 12/29/18 1240

## 2018-12-24 NOTE — ED Notes (Signed)
Spoke with Beth,RN at [employer]. Recommendations include NPO x 2 hours, may rinse mouth, baseline labs, tylenol level, EKG,GI consult. May consider EGD

## 2018-12-24 NOTE — H&P (Signed)
TRH H&P    Patient Demographics:    Kathryn [name], is a 48 y.o. female  MRN: [id number]  DOB - 1971-03-08  Admit Date - 12/24/2018  Referring MD/NP/PA: PA Petrucelli  Outpatient Primary MD for the patient is Jani Gravel, MD  Patient coming from: Home  Chief complaint-intentional poisoning   HPI:    Kathryn [name]  is a 48 y.o. female, with history of neuropathy, hypertension, GERD, depression, COPD, anxiety and asthma presents today with intentional poisoning.  Patient reports that she was binge drinking yesterday, was fighting with her kids today, and decided that she did not lose her live anymore.  She reports that she "wanted an easy way out."  She reportedly drank a glass of bleach and vinegar in an attempt to end her life.  Patient reports that she started feeling nauseous, and having severe abdominal pain shortly after.  She was laying on the couch when her brother came over and decided that she needed to go into the ED.  Patient reports that she is currently being treated for depression with Celexa.  She sees mental health, but has not been able to see them since the pandemic started in March.  She reports that last year in August she had a suicidal attempt with the same plan-drinking bleach and vinegar.  At that time she reports she was hospitalized medically and then in a psychiatric facility.  At this time patient reports throat and epigastric pain.  The pain is burning.  It is a 10 out of 10 severity.  Morphine did not help.  Nothing helps.  Coughing makes the pain worse.  The pain is constant.  She has associated nausea and vomiting.  Her emesis is nonbloody.  Patient does report that she was binge drinking yesterday.  She said she drinks 6- 7 40s, and a liquor that I cannot recognize the name of.  She also reports that a neighbor brought over moonshine and she had some of that.  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  She cannot handle the stressors of her children fighting with her and this brought on the cocaine use drinking and a suicide attempt.  Patient was seen by psychiatry and it was determined that patient meets criteria for inpatient hospitalization.  There are currently no appropriate beds for patient at [facility], [facility].  They will be trying to place this patient.  In the ED poison control was consulted about this patient and they recommended n.p.o. status x2 hours, baseline labs, Tylenol level, EKG, GI consult.  GI saw the patient and they are requesting strict n.p.o. status with plan for EGD on September 9.  Salicylate level in the ED was less than 7.  Alcohol is less than 10.  Tylenol is less than 10.  Blood work was done that showed a hyponatremia, hypokalemia, elevated anion gap at 15, stable troponin, and a negative i-STAT pregnancy test.  CT scan of chest abdomen and pelvis showed no  acute findings in the chest, abdomen or pelvis.  COVID test is negative.   Review of systems:    In addition to the HPI above,  No Fever-chills, No Headache, No changes with Vision or hearing, No problems swallowing food or Liquids, No Chest pain, Endorses cough, and chronic dyspnea. Positive for abdominal epigastric pain No Blood in stool or Urine, No dysuria, No new skin rashes or bruises, No new joints pains-aches,  No new weakness, tingling, numbness in any extremity, No recent weight gain or loss, No polyuria, polydypsia or polyphagia, No significant Mental Stressors. Endorses significant mental stressors All other systems reviewed and are negative.    Past History of the following :    Past Medical History:  Diagnosis Date  . Anxiety   . Arthritis   . Asthma   . Chronic back pain   . COPD (chronic obstructive pulmonary disease) (HCC)    Chronic bronchitis  . Depression   . GERD (gastroesophageal reflux disease)   . Hypertension    . Neuropathy   . Osteoarthritis of left knee, 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      Past Surgical History:  Procedure Laterality Date  . PATELLA-FEMORAL ARTHROPLASTY Left 10/08/2018   Procedure: PATELLA-FEMORAL ARTHROPLASTY;  Surgeon: Marchia Bond, MD;  Location: [facility];  Service: Orthopedics;  Laterality: Left;  . TUBAL LIGATION        Social History:      Social History   Tobacco Use  . Smoking status: Former Smoker    Types: Cigarettes    Quit date: 2016    Years since quitting: 4.6  . Smokeless tobacco: Never Used  . Tobacco comment: wears nicotine patches  Substance Use Topics  . Alcohol use: Yes    Comment: occ       Family History :     Family History  Problem Relation Age of Onset  . Gout Paternal Grandfather   . Cirrhosis Paternal Grandfather   . Hypertension Paternal Grandmother   . Aneurysm Paternal Grandmother   . Cirrhosis Maternal Grandmother   . Cirrhosis Maternal Grandfather   . Cancer Father   . Cirrhosis Father   . Cirrhosis Mother   . Breast cancer Sister   . Hypertension Sister   . Bronchitis Daughter   . Bronchitis Daughter   . Asthma Son   . Bronchitis Son   . Migraines Neg Hx       Home Medications:   Prior to Admission medications   Medication Sig Start Date End Date Taking? Authorizing Provider  amLODipine (NORVASC) 5 MG tablet Take 5 mg by mouth daily.   Yes [provider]  methocarbamol (ROBAXIN) 750 MG tablet Take 1 tablet (750 mg total) by mouth 3 (three) times daily as needed for muscle spasms. 10/08/18  Yes Marchia Bond, MD  omeprazole (PRILOSEC) 20 MG capsule Take 1 po BID x 2 weeks then once a day Patient taking differently: Take 20 mg by mouth daily.  03/24/18  Yes Rolland Porter, MD  oxyCODONE (ROXICODONE) 5 MG immediate release tablet Take 1 tablet (5 mg total) by mouth every 4 (four) hours as needed for severe pain. 10/08/18  Yes Marchia Bond, MD  albuterol (PROVENTIL HFA;VENTOLIN HFA) 108 (90 Base) MCG/ACT inhaler  Inhale 2 puffs into the lungs 2 (two) times daily.    [provider]  albuterol (PROVENTIL) (2.5 MG/3ML) 0.083% nebulizer solution Take 2.5 mg by nebulization every 6 (six) hours as needed for wheezing or shortness of breath.    [provider]  aspirin EC 325 MG tablet Take 1 tablet (325 mg total) by mouth 2 (two) times daily. 10/08/18   Marchia Bond, MD  benzonatate (TESSALON) 200 MG capsule Take 1 capsule (200 mg total) by mouth 3 (three) times daily as needed for cough. Swallow whole, do not chew Patient taking differently: Take 200 mg by mouth 3 (three) times daily. Swallow whole, do not chew 09/18/18   Triplett, Tammy, PA-C  cholecalciferol (VITAMIN D3) 25 MCG (1000 UT) tablet Take 2,000 Units by mouth daily.    [provider]  citalopram (CELEXA) 20 MG tablet Take 20 mg by mouth daily.    [provider]  famotidine (PEPCID) 20 MG tablet Take 1 tablet (20 mg total) by mouth 2 (two) times daily. 02/06/18   Virgel Manifold, MD  fluticasone (FLONASE) 50 MCG/ACT nasal spray Place 1 spray into both nostrils daily.    [provider]  gabapentin (NEURONTIN) 300 MG capsule Take 1 capsule (300 mg total) by mouth 3 (three) times daily. 12/28/17   Starkes-Perry, Gayland Curry, FNP  hydrochlorothiazide (MICROZIDE) 12.5 MG capsule Take 12.5 mg by mouth daily.    [provider]  loratadine (CLARITIN) 10 MG tablet Take 10 mg by mouth daily.     [provider]  mirtazapine (REMERON) 30 MG tablet Take 30 mg by mouth at bedtime.    [provider]  ondansetron (ZOFRAN) 4 MG tablet Take 1 tablet (4 mg total) by mouth every 8 (eight) hours as needed for nausea or vomiting. 10/08/18   Marchia Bond, MD  potassium chloride SA (K-DUR,KLOR-CON) 20 MEQ tablet Take 1 tablet (20 mEq total) by mouth 2 (two) times daily. 04/19/18   Evalee Jefferson, PA-C  sennosides-docusate sodium (SENOKOT-S) 8.6-50 MG tablet Take 2 tablets by mouth daily. 10/08/18   Marchia Bond, MD  traZODone (DESYREL) 150 MG tablet Take 150-300 mg by mouth at bedtime as needed for sleep.    [provider]     Allergies:     Allergies  Allergen Reactions  . Clonopin [Clonazepam] Anaphylaxis    Swelling of the tongue and mouth.   . Fish Allergy Anaphylaxis, Shortness Of Breath and Swelling  . Flexeril [Cyclobenzaprine Hcl] Shortness Of Breath  . Ibuprofen Anaphylaxis and Hives  . Shellfish Allergy Anaphylaxis  . Ace Inhibitors Cough  . Tramadol Nausea And Vomiting    Upset stomach     Physical Exam:   Vitals  Blood pressure (!) 129/91, pulse 91, temperature 97.7 F (36.5 C), temperature source Oral, resp. rate 18, height 4\' 11"  (1.499 m), weight 78 kg, last menstrual period 12/24/2018, SpO2 99 %.  1.  General: Lying left lateral decubitus in bed, tearful  2. Psychiatric: Tearful, with fair insight, alert and oriented x3  3. Neurologic: Cranial nerves II through XII are grossly intact, no focal deficits noted on limited exam  4. HEENMT:  Head is atraumatic normocephalic pupils are reactive to light no neck masses trachea is midline  5. Respiratory : Upper airway sounds wet, and is cleared with cough.  Lungs are clear to auscultation  6. Cardiovascular : Heart rate and rhythm are regular  7. Gastrointestinal:  Abdomen is tender to palpation in the upper left and upper right quadrant the most intense tenderness is midline epigastrium.  Abdomen is soft, nondistended.  8. Skin:  No rashes or lesions on limited skin exam  9.Musculoskeletal:  No peripheral edema    Data Review:    CBC Recent Labs  Lab 12/24/18 1500  WBC 8.0  HGB 12.1  HCT 36.5  PLT 592*  MCV 87.1  MCH 28.9  MCHC 33.2  RDW 15.2  LYMPHSABS 3.0  MONOABS 0.6  EOSABS 0.1  BASOSABS 0.0   ------------------------------------------------------------------------------------------------------------------  Results for orders placed or performed during the hospital  encounter of 12/24/18 (from the past 48 hour(s))  Comprehensive metabolic panel     Status: Abnormal   Collection Time: 12/24/18  3:00 PM  Result Value Ref Range   Sodium 134 (L) 135 - 145 mmol/L   Potassium 3.1 (L) 3.5 - 5.1 mmol/L   Chloride 100 98 - 111 mmol/L   CO2 19 (L) 22 - 32 mmol/L   Glucose, Bld 84 70 - 99 mg/dL   BUN 6 6 - 20 mg/dL   Creatinine, Ser 0.75 0.44 - 1.00 mg/dL   Calcium 8.9 8.9 - 10.3 mg/dL   Total Protein 7.7 6.5 - 8.1 g/dL   Albumin 3.9 3.5 - 5.0 g/dL   AST 39 15 - 41 U/L   ALT 38 0 - 44 U/L   Alkaline Phosphatase 74 38 - 126 U/L   Total Bilirubin 0.7 0.3 - 1.2 mg/dL   GFR calc non Af Amer >60 >60 mL/min   GFR calc Af Amer >60 >60 mL/min   Anion gap 15 5 - 15    Comment: Performed at [facility], [address] [place], [place] [address]  Ethanol     Status: None   Collection Time: 12/24/18  3:00 PM  Result Value Ref Range   Alcohol, Ethyl (B) <10 <10 mg/dL    Comment: (NOTE) Lowest detectable limit for serum alcohol is 10 mg/dL. For medical purposes only. Performed at [facility], [address] [place], [place] [id number]   Salicylate level     Status: None   Collection Time: 12/24/18  3:00 PM  Result Value Ref Range   Salicylate Lvl [id number] 2.8 - 30.0 mg/dL    Comment: Performed at [facility], [address], [place], [place] [address]  Acetaminophen level     Status: Abnormal   Collection Time: 12/24/18  3:00 PM  Result Value Ref Range   Acetaminophen (Tylenol), Serum <10 (L) 10 - 30 ug/mL    Comment: (NOTE) Therapeutic concentrations vary significantly. A range of 10-30 ug/mL  may be an effective concentration for many patients. However, some  are best treated at concentrations outside of this range. Acetaminophen concentrations >150 ug/mL at 4 hours after ingestion  and >50 ug/mL at 12 hours after ingestion are often associated with  toxic reactions. Performed at [facility], [address], [place],  [address]   CBC with  Differential     Status: Abnormal   Collection Time: 12/24/18  3:00 PM  Result Value Ref Range   WBC 8.0 4.0 - 10.5 K/uL   RBC 4.19 3.87 - 5.11 MIL/uL   Hemoglobin 12.1 12.0 - 15.0 g/dL   HCT 36.5 36.0 - 46.0 %   MCV 87.1 80.0 - 100.0 fL   MCH 28.9 26.0 - 34.0 pg   MCHC 33.2 30.0 - 36.0 g/dL   RDW 15.2 11.5 - 15.5 %   Platelets 592 (H) 150 - 400 K/uL   nRBC 0.0 0.0 - 0.2 %   Neutrophils Relative % 53 %   Neutro Abs 4.2 1.7 - 7.7 K/uL   Lymphocytes Relative 38 %   Lymphs Abs 3.0 0.7 - 4.0 K/uL   Monocytes Relative 8 %  Monocytes Absolute 0.6 0.1 - 1.0 K/uL   Eosinophils Relative 1 %   Eosinophils Absolute 0.1 0.0 - 0.5 K/uL   Basophils Relative 0 %   Basophils Absolute 0.0 0.0 - 0.1 K/uL   Immature Granulocytes 0 %   Abs Immature Granulocytes 0.03 0.00 - 0.07 K/uL    Comment: Performed at [facility], [address], [place], [place] [address]  Lipase, blood     Status: None   Collection Time: 12/24/18  3:00 PM  Result Value Ref Range   Lipase 28 11 - 51 U/L    Comment: Performed at [facility], [address] [place] [address], [place], [place] [address]  I-Stat beta hCG blood, ED     Status: None   Collection Time: 12/24/18  5:11 PM  Result Value Ref Range   I-stat hCG, quantitative <5.0 <5 mIU/mL   Comment 3            Comment:   GEST. AGE      CONC.  (mIU/mL)   <=1 WEEK        5 - 50     2 WEEKS       50 - 500     3 WEEKS       100 - 10,000     4 WEEKS     1,000 - 30,000        FEMALE AND NON-PREGNANT FEMALE:     LESS THAN 5 mIU/mL   SARS Coronavirus 2 [facility] order, Performed in [facility] hospital lab) Nasopharyngeal Nasopharyngeal Swab     Status: None   Collection Time: 12/24/18  7:46 PM   Specimen: Nasopharyngeal Swab  Result Value Ref Range   SARS Coronavirus 2 NEGATIVE NEGATIVE    Comment: (NOTE) If result is NEGATIVE SARS-CoV-2 target nucleic acids are NOT DETECTED. The SARS-CoV-2 RNA is generally detectable in upper and lower  respiratory specimens during the acute  phase of infection. The lowest  concentration of SARS-CoV-2 viral copies this assay can detect is 250  copies / mL. A negative result does not preclude SARS-CoV-2 infection  and should not be used as the sole basis for treatment or other  patient management decisions.  A negative result may occur with  improper specimen collection / handling, submission of specimen other  than nasopharyngeal swab, presence of viral mutation(s) within the  areas targeted by this assay, and inadequate number of viral copies  (<250 copies / mL). A negative result must be combined with clinical  observations, patient history, and epidemiological information. If result is POSITIVE SARS-CoV-2 target nucleic acids are DETECTED. The SARS-CoV-2 RNA is generally detectable in upper and lower  respiratory specimens dur ing the acute phase of infection.  Positive  results are indicative of active infection with SARS-CoV-2.  Clinical  correlation with patient history and other diagnostic information is  necessary to determine patient infection status.  Positive results do  not rule out bacterial infection or co-infection with other viruses. If result is PRESUMPTIVE POSTIVE SARS-CoV-2 nucleic acids MAY BE PRESENT.   A presumptive positive result was obtained on the submitted specimen  and confirmed on repeat testing.  While 2019 novel coronavirus  (SARS-CoV-2) nucleic acids may be present in the submitted sample  additional confirmatory testing may be necessary for epidemiological  and / or clinical management purposes  to differentiate between  SARS-CoV-2 and other Sarbecovirus currently known to infect humans.  If clinically indicated additional testing with an alternate test  methodology [phone]) is  advised. The SARS-CoV-2 RNA is generally  detectable in upper and lower respiratory sp ecimens during the acute  phase of infection. The expected result is Negative. Fact Sheet for Patients:   StrictlyIdeas.no Fact Sheet for Healthcare Providers: BankingDealers.co.za This test is not yet approved or cleared by the [place] FDA and has been authorized for detection and/or diagnosis of SARS-CoV-2 by FDA under an Emergency Use Authorization (EUA).  This EUA will remain in effect (meaning this test can be used) for the duration of the COVID-19 declaration under Section 564(b)(1) of the Act, 21 U.S.C. section 360bbb-3(b)(1), unless the authorization is terminated or revoked sooner. Performed at [facility], [address] [place], [place] [address]     Chemistries  Recent Labs  Lab 12/24/18 1500  NA 134*  K 3.1*  CL 100  CO2 19*  GLUCOSE 84  BUN 6  CREATININE 0.75  CALCIUM 8.9  AST 39  ALT 38  ALKPHOS 74  BILITOT 0.7   ------------------------------------------------------------------------------------------------------------------  ------------------------------------------------------------------------------------------------------------------ GFR: Estimated Creatinine Clearance: 76.7 mL/min (by C-G formula based on SCr of 0.75 mg/dL). Liver Function Tests: Recent Labs  Lab 12/24/18 1500  AST 39  ALT 38  ALKPHOS 74  BILITOT 0.7  PROT 7.7  ALBUMIN 3.9   Recent Labs  Lab 12/24/18 1500  LIPASE 28   No results for input(s): AMMONIA in the last 168 hours. Coagulation Profile: No results for input(s): INR, PROTIME in the last 168 hours. Cardiac Enzymes: No results for input(s): CKTOTAL, CKMB, CKMBINDEX, TROPONINI in the last 168 hours. BNP (last 3 results) No results for input(s): PROBNP in the last 8760 hours. HbA1C: No results for input(s): HGBA1C in the last 72 hours. CBG: No results for input(s): GLUCAP in the last 168 hours. Lipid Profile: No results for input(s): CHOL, HDL, LDLCALC, TRIG, CHOLHDL, LDLDIRECT in the last 72 hours. Thyroid Function Tests: No results for input(s): TSH, T4TOTAL,  FREET4, T3FREE, THYROIDAB in the last 72 hours. Anemia Panel: No results for input(s): VITAMINB12, FOLATE, FERRITIN, TIBC, IRON, RETICCTPCT in the last 72 hours.  --------------------------------------------------------------------------------------------------------------- Urine analysis:    Component Value Date/Time   COLORURINE YELLOW 09/18/2018 1020   APPEARANCEUR HAZY (A) 09/18/2018 1020   LABSPEC 1.016 09/18/2018 1020   PHURINE 6.0 09/18/2018 1020   GLUCOSEU NEGATIVE 09/18/2018 1020   HGBUR NEGATIVE 09/18/2018 1020   BILIRUBINUR NEGATIVE 09/18/2018 1020   KETONESUR NEGATIVE 09/18/2018 1020   PROTEINUR NEGATIVE 09/18/2018 1020   UROBILINOGEN 4.0 (H) 08/05/2014 1415   NITRITE NEGATIVE 09/18/2018 1020   LEUKOCYTESUR SMALL (A) 09/18/2018 1020      Imaging Results:    Ct Chest W Contrast  Result Date: 12/24/2018 CLINICAL DATA:  Ingestion bleach.  Epigastric pain. EXAM: CT CHEST, ABDOMEN, AND PELVIS WITH CONTRAST TECHNIQUE: Multidetector CT imaging of the chest, abdomen and pelvis was performed following the standard protocol during bolus administration of intravenous contrast. CONTRAST:  146mL OMNIPAQUE IOHEXOL 300 MG/ML  SOLN COMPARISON:  CT abdomen and pelvis 03/21/2018.  CTA chest 05/10/2018 FINDINGS: CT CHEST FINDINGS Cardiovascular: Heart is normal size. Aorta is normal caliber. Mediastinum/Nodes: No mediastinal, hilar, or axillary adenopathy. Trachea and esophagus are unremarkable. Thyroid unremarkable. Lungs/Pleura: Lungs are clear. No focal airspace opacities or suspicious nodules. No effusions. Musculoskeletal: Chest wall soft tissues are unremarkable. No acute bony abnormality. CT ABDOMEN PELVIS FINDINGS Hepatobiliary: Diffuse low-density throughout the liver compatible with fatty infiltration. No focal abnormality. Gallbladder unremarkable. Pancreas: No focal abnormality or ductal dilatation. Spleen: No focal abnormality.  Normal size. Adrenals/Urinary  Tract: No adrenal  abnormality. No focal renal abnormality. No stones or hydronephrosis. Urinary bladder is unremarkable. Stomach/Bowel: Stomach is unremarkable. Large and small bowel decompressed, unremarkable. No evidence of perforation. Appendix normal. Vascular/Lymphatic: No evidence of aneurysm or adenopathy. Reproductive: Uterus and adnexa unremarkable.  No mass. Other: No free fluid or free air. Musculoskeletal: No acute bony abnormality. Degenerative changes in the lumbar spine. IMPRESSION: No acute findings in the chest, abdomen or pelvis. Fatty infiltration of the liver. Electronically Signed   By: Rolm Baptise M.D.   On: 12/24/2018 19:21   Ct Abdomen Pelvis W Contrast  Result Date: 12/24/2018 CLINICAL DATA:  Ingestion bleach.  Epigastric pain. EXAM: CT CHEST, ABDOMEN, AND PELVIS WITH CONTRAST TECHNIQUE: Multidetector CT imaging of the chest, abdomen and pelvis was performed following the standard protocol during bolus administration of intravenous contrast. CONTRAST:  150mL OMNIPAQUE IOHEXOL 300 MG/ML  SOLN COMPARISON:  CT abdomen and pelvis 03/21/2018.  CTA chest 05/10/2018 FINDINGS: CT CHEST FINDINGS Cardiovascular: Heart is normal size. Aorta is normal caliber. Mediastinum/Nodes: No mediastinal, hilar, or axillary adenopathy. Trachea and esophagus are unremarkable. Thyroid unremarkable. Lungs/Pleura: Lungs are clear. No focal airspace opacities or suspicious nodules. No effusions. Musculoskeletal: Chest wall soft tissues are unremarkable. No acute bony abnormality. CT ABDOMEN PELVIS FINDINGS Hepatobiliary: Diffuse low-density throughout the liver compatible with fatty infiltration. No focal abnormality. Gallbladder unremarkable. Pancreas: No focal abnormality or ductal dilatation. Spleen: No focal abnormality.  Normal size. Adrenals/Urinary Tract: No adrenal abnormality. No focal renal abnormality. No stones or hydronephrosis. Urinary bladder is unremarkable. Stomach/Bowel: Stomach is unremarkable. Large and small  bowel decompressed, unremarkable. No evidence of perforation. Appendix normal. Vascular/Lymphatic: No evidence of aneurysm or adenopathy. Reproductive: Uterus and adnexa unremarkable.  No mass. Other: No free fluid or free air. Musculoskeletal: No acute bony abnormality. Degenerative changes in the lumbar spine. IMPRESSION: No acute findings in the chest, abdomen or pelvis. Fatty infiltration of the liver. Electronically Signed   By: Rolm Baptise M.D.   On: 12/24/2018 19:21    My personal review of EKG: Rhythm sinus tachycardia, Rate 107 /min, QTc 447,no Acute ST changes   Assessment & Plan:    Active Problems:   Suicidal ideation   Abdominal pain   1. Intentional poisoning 1. Poison control consulted recommendations being followed 2. Poising was with bleach and vinegar-caustic agents 3. GI consulted 4. Plan for EGD tomorrow 5. Strict n.p.o. status 6. IV Zofran for nausea 2. Suicidal ideation 1. Psych consulted 2. Working on placement in inpatient behavioral health unit 3. Hyponatremia 1. Minimally low at 134 2. Continue to monitor with daily BMP 4. Hypokalemia 1. Replace and repeat 5. Elevated anion gap 1. ABG to evaluate for acidosis 2. Continue to monitor gap with daily BMP 6. Asthma 1. Continue asthma medications 7. Hypertension 1. PRN hydralazine as patient cannot take her p.o. hypertensive medications due to strict n.p.o. status     DVT Prophylaxis-holding on pharmacologic VTE prophylaxis due to risk of GI bleed if she has caustic injury  AM Labs Ordered, also please review Full Orders  Family Communication: Admission, patients condition and plan of care including tests being ordered have been discussed with the patient who indicates understanding and agrees.  Patient reports that the only person that can be updated regarding her status is Arelia Longest [phone]  Code Status: Full  Admission status: Inpatient: Based on patients clinical presentation and evaluation  of above clinical data, I have made determination that patient meets Inpatient criteria at this time.  Time spent in minutes : 68   Guerin Lashomb B Zierle-Ghosh M.D on 12/24/2018 at 11:04 PM

## 2018-12-25 ENCOUNTER — Inpatient Hospital Stay (HOSPITAL_COMMUNITY): Payer: Medicaid Other | Admitting: Anesthesiology

## 2018-12-25 ENCOUNTER — Encounter (HOSPITAL_COMMUNITY): Payer: Self-pay

## 2018-12-25 ENCOUNTER — Encounter (HOSPITAL_COMMUNITY)
Admission: EM | Disposition: A | Discharge status: Home / Self Care | Payer: Self-pay | Source: Home / Self Care | Attending: Internal Medicine

## 2018-12-25 DIAGNOSIS — K449 Diaphragmatic hernia without obstruction or gangrene: Secondary | ICD-10-CM

## 2018-12-25 DIAGNOSIS — R1013 Epigastric pain: Secondary | ICD-10-CM

## 2018-12-25 DIAGNOSIS — R45851 Suicidal ideations: Secondary | ICD-10-CM

## 2018-12-25 DIAGNOSIS — K3189 Other diseases of stomach and duodenum: Secondary | ICD-10-CM

## 2018-12-25 DIAGNOSIS — K228 Other specified diseases of esophagus: Secondary | ICD-10-CM

## 2018-12-25 HISTORY — PX: ESOPHAGOGASTRODUODENOSCOPY (EGD) WITH PROPOFOL: SHX5813

## 2018-12-25 LAB — COMPREHENSIVE METABOLIC PANEL
ALT: 28 U/L (ref 0–44)
AST: 26 U/L (ref 15–41)
Albumin: 3.4 g/dL — ABNORMAL LOW (ref 3.5–5.0)
Alkaline Phosphatase: 62 U/L (ref 38–126)
Anion gap: 7 (ref 5–15)
BUN: 7 mg/dL (ref 6–20)
CO2: 24 mmol/L (ref 22–32)
Calcium: 8.1 mg/dL — ABNORMAL LOW (ref 8.9–10.3)
Chloride: 104 mmol/L (ref 98–111)
Creatinine, Ser: 0.78 mg/dL (ref 0.44–1.00)
GFR calc Af Amer: >60 mL/min (ref >60)
GFR calc non Af Amer: >60 mL/min (ref >60)
Glucose, Bld: 84 mg/dL (ref 70–99)
Potassium: 3.4 mmol/L — ABNORMAL LOW (ref 3.5–5.1)
Sodium: 135 mmol/L (ref 135–145)
Total Bilirubin: 0.7 mg/dL (ref 0.3–1.2)
Total Protein: 6.4 g/dL — ABNORMAL LOW (ref 6.5–8.1)

## 2018-12-25 LAB — URINALYSIS, ROUTINE W REFLEX MICROSCOPIC
Bacteria, UA: NONE SEEN
Bilirubin Urine: NEGATIVE
Glucose, UA: NEGATIVE mg/dL
Hgb urine dipstick: NEGATIVE
Ketones, ur: 20 mg/dL — AB
Nitrite: NEGATIVE
Protein, ur: 30 mg/dL — AB
Specific Gravity, Urine: >1.046 — ABNORMAL HIGH (ref 1.005–1.030)
pH: 5 (ref 5.0–8.0)

## 2018-12-25 LAB — CBC
HCT: 34.6 % — ABNORMAL LOW (ref 36.0–46.0)
Hemoglobin: 11.2 g/dL — ABNORMAL LOW (ref 12.0–15.0)
MCH: 29.2 pg (ref 26.0–34.0)
MCHC: 32.4 g/dL (ref 30.0–36.0)
MCV: 90.1 fL (ref 80.0–100.0)
Platelets: 502 10*3/uL — ABNORMAL HIGH (ref 150–400)
RBC: 3.84 MIL/uL — ABNORMAL LOW (ref 3.87–5.11)
RDW: 15.5 % (ref 11.5–15.5)
WBC: 6.1 10*3/uL (ref 4.0–10.5)
nRBC: 0 % (ref 0.0–0.2)

## 2018-12-25 LAB — RAPID URINE DRUG SCREEN, HOSP PERFORMED
Amphetamines: NOT DETECTED
Barbiturates: NOT DETECTED
Benzodiazepines: NOT DETECTED
Cocaine: POSITIVE — AB
Opiates: NOT DETECTED
Tetrahydrocannabinol: NOT DETECTED

## 2018-12-25 SURGERY — ESOPHAGOGASTRODUODENOSCOPY (EGD) WITH PROPOFOL
Anesthesia: General

## 2018-12-25 MED ORDER — LORATADINE 10 MG PO TABS
10.0000 mg | ORAL_TABLET | Freq: Every day | ORAL | Status: DC
  Administered 2018-12-26 – 2018-12-27 (×2): 10 mg via ORAL
  Filled 2018-12-25 (×2): qty 1

## 2018-12-25 MED ORDER — LACTATED RINGERS IV SOLN
INTRAVENOUS | Status: DC | PRN
  Administered 2018-12-25: 11:00:00 via INTRAVENOUS

## 2018-12-25 MED ORDER — PROPOFOL 10 MG/ML IV BOLUS
INTRAVENOUS | Status: DC | PRN
  Administered 2018-12-25 (×3): 20 mg via INTRAVENOUS

## 2018-12-25 MED ORDER — KETAMINE HCL 50 MG/5ML IJ SOSY
PREFILLED_SYRINGE | INTRAMUSCULAR | Status: AC
  Filled 2018-12-25: qty 5

## 2018-12-25 MED ORDER — LIDOCAINE HCL (PF) 1 % IJ SOLN
INTRAMUSCULAR | Status: AC
  Filled 2018-12-25: qty 10

## 2018-12-25 MED ORDER — SUCRALFATE 1 GM/10ML PO SUSP
1.0000 g | Freq: Three times a day (TID) | ORAL | Status: DC
  Administered 2018-12-25 – 2018-12-27 (×9): 1 g via ORAL
  Filled 2018-12-25 (×9): qty 10

## 2018-12-25 MED ORDER — METHOCARBAMOL 500 MG PO TABS
750.0000 mg | ORAL_TABLET | Freq: Three times a day (TID) | ORAL | Status: DC | PRN
  Administered 2018-12-26: 750 mg via ORAL
  Filled 2018-12-25: qty 2

## 2018-12-25 MED ORDER — METOPROLOL TARTRATE 5 MG/5ML IV SOLN
INTRAVENOUS | Status: AC
  Filled 2018-12-25: qty 10

## 2018-12-25 MED ORDER — SODIUM CHLORIDE 0.9% FLUSH
INTRAVENOUS | Status: AC
  Filled 2018-12-25: qty 40

## 2018-12-25 MED ORDER — PANTOPRAZOLE SODIUM 40 MG IV SOLR
40.0000 mg | INTRAVENOUS | Status: DC
  Administered 2018-12-25 – 2018-12-27 (×3): 40 mg via INTRAVENOUS
  Filled 2018-12-25 (×3): qty 40

## 2018-12-25 MED ORDER — KETAMINE HCL 10 MG/ML IJ SOLN
INTRAMUSCULAR | Status: DC | PRN
  Administered 2018-12-25: 10 mg via INTRAVENOUS

## 2018-12-25 MED ORDER — PROPOFOL 500 MG/50ML IV EMUL
INTRAVENOUS | Status: DC | PRN
  Administered 2018-12-25: 150 ug/kg/min via INTRAVENOUS

## 2018-12-25 MED ORDER — GLUCAGON HCL RDNA (DIAGNOSTIC) 1 MG IJ SOLR
INTRAMUSCULAR | Status: AC
  Filled 2018-12-25: qty 1

## 2018-12-25 MED ORDER — VITAMIN D 25 MCG (1000 UNIT) PO TABS
2000.0000 [IU] | ORAL_TABLET | Freq: Every day | ORAL | Status: DC
  Administered 2018-12-26 – 2018-12-27 (×2): 2000 [IU] via ORAL
  Filled 2018-12-25 (×2): qty 2

## 2018-12-25 MED ORDER — MIRTAZAPINE 15 MG PO TABS
30.0000 mg | ORAL_TABLET | Freq: Every day | ORAL | Status: DC
  Administered 2018-12-25 – 2018-12-26 (×2): 30 mg via ORAL
  Filled 2018-12-25 (×2): qty 2

## 2018-12-25 MED ORDER — SUGAMMADEX SODIUM 200 MG/2ML IV SOLN
INTRAVENOUS | Status: AC
  Filled 2018-12-25: qty 4

## 2018-12-25 MED ORDER — MORPHINE SULFATE (PF) 2 MG/ML IV SOLN
2.0000 mg | INTRAVENOUS | Status: DC | PRN
  Administered 2018-12-25 – 2018-12-27 (×10): 2 mg via INTRAVENOUS
  Filled 2018-12-25 (×13): qty 1

## 2018-12-25 MED ORDER — PROPOFOL 10 MG/ML IV BOLUS
INTRAVENOUS | Status: AC
  Filled 2018-12-25: qty 40

## 2018-12-25 MED ORDER — GABAPENTIN 300 MG PO CAPS
300.0000 mg | ORAL_CAPSULE | Freq: Three times a day (TID) | ORAL | Status: DC
  Administered 2018-12-25 – 2018-12-27 (×7): 300 mg via ORAL
  Filled 2018-12-25 (×7): qty 1

## 2018-12-25 MED ORDER — CITALOPRAM HYDROBROMIDE 20 MG PO TABS
20.0000 mg | ORAL_TABLET | Freq: Every day | ORAL | Status: DC
  Administered 2018-12-26 – 2018-12-27 (×2): 20 mg via ORAL
  Filled 2018-12-25 (×2): qty 1

## 2018-12-25 MED ORDER — METOPROLOL TARTRATE 5 MG/5ML IV SOLN
INTRAVENOUS | Status: DC | PRN
  Administered 2018-12-25: 3 mg via INTRAVENOUS

## 2018-12-25 MED ORDER — EPHEDRINE SULFATE 50 MG/ML IJ SOLN
INTRAMUSCULAR | Status: AC
  Filled 2018-12-25: qty 1

## 2018-12-25 MED ORDER — POTASSIUM CHLORIDE CRYS ER 20 MEQ PO TBCR
40.0000 meq | EXTENDED_RELEASE_TABLET | Freq: Once | ORAL | Status: AC
  Administered 2018-12-25: 40 meq via ORAL
  Filled 2018-12-25: qty 2

## 2018-12-25 MED ORDER — AMLODIPINE BESYLATE 5 MG PO TABS
5.0000 mg | ORAL_TABLET | Freq: Every day | ORAL | Status: DC
  Administered 2018-12-26 – 2018-12-27 (×2): 5 mg via ORAL
  Filled 2018-12-25 (×2): qty 1

## 2018-12-25 MED ORDER — TRAZODONE HCL 50 MG PO TABS
150.0000 mg | ORAL_TABLET | Freq: Every evening | ORAL | Status: DC | PRN
  Filled 2018-12-25: qty 6

## 2018-12-25 MED ORDER — SODIUM CHLORIDE 0.9 % IV SOLN: INTRAVENOUS | Status: DC

## 2018-12-25 NOTE — Progress Notes (Signed)
Initial Nutrition Assessment  DOCUMENTATION CODES:   Obesity unspecified  INTERVENTION:  -Ensure Enlive po BID, each supplement provides 350 kcal and 20 grams of protein -MVI daily -Advance diet as tolerated   NUTRITION DIAGNOSIS:   Inadequate oral intake related to altered GI function, social / environmental circumstances as evidenced by meal completion < 50%(erythema to gastric mucosa s/p ingestion of bleach and vinegar).   GOAL:   Patient will meet greater than or equal to 90% of their needs   MONITOR:   PO intake, Supplement acceptance, Diet advancement, I & O's, Labs, Weight trends  REASON FOR ASSESSMENT:   Malnutrition Screening Tool    ASSESSMENT:  RD working remotely.  48 year old female with history of neuropathy, HTN, GERD, COPD, asthma depression, anxiety who presented to ED with intentional poisoning s/p binge drinking yesterday. Patient reportedly drank a glass of bleach and vinegar in attempt to end her life; reports feeling nauseous and having severe abdominal pain shortly after.  Per chart review, pt reported she is currently being treated for depression with Celexa; previous suicide attempt last August with bleach and vinegar; pt hospitalized both medically and treated at psychiatric facility.   Prior to suicide attempt, pt reported drinking 6-7 40oz beer, liquor, and moonshine. Patient seen by psychiatry today; meets criteria for inpatient hospitalization; pending placement per MD note.  EGD today with noted erythema to gastric mucosa; no erosions or ulcers. Will order Ensure to assist with calorie and protein needs.  Currently consuming 0-30% of meals on FL diet. Will monitor for diet advancement and intake.  Current wt 83.4 kg (183.5 lb) Wt history reviewed - stable  Medications reviewed and include: celexa, gabapentin, protonix, remeron, carafate  Labs: K 3.4 (L) Corrected Ca 8.6 (L)  NUTRITION - FOCUSED PHYSICAL EXAM: Unable to complete; RD  working remotely.   Diet Order:   Diet Order            Diet Heart Room service appropriate? Yes; Fluid consistency: Thin  Diet effective now        Diet full liquid Room service appropriate? Yes; Fluid consistency: Thin  Diet effective now              EDUCATION NEEDS:   No education needs have been identified at this time  Skin:  Skin Assessment: Reviewed RN Assessment  Last BM:  PTA  Height:   Ht Readings from Last 1 Encounters:  12/24/18 4\' 11"  (1.499 m)    Weight:   Wt Readings from Last 1 Encounters:  12/24/18 83.4 kg    Ideal Body Weight:  43.6 kg  BMI:  Body mass index is 37.14 kg/m.  Estimated Nutritional Needs:   Kcal:  [id number]  Protein:  90-103  Fluid:  >1.8L/day   Lajuan Lines, RD, LDN [employer] Telephone [phone] After Hours/Weekend Pager: [phone]

## 2018-12-25 NOTE — Progress Notes (Signed)
EGD findings reviewed with patient. She does not appear to be in pain. She says she ate about one fourth of her meal. She wants her diet to be advanced tomorrow morning. We will advance diet to heart healthy diet.

## 2018-12-25 NOTE — Progress Notes (Addendum)
PROGRESS NOTE    Kathryn [name]  [id number] DOB: 30-Jul-1970 DOA: 12/24/2018 PCP: Jani Gravel, MD   Brief Narrative:  Per HPI: Kathryn [name]  is a 48 y.o. female, with history of neuropathy, hypertension, GERD, depression, COPD, anxiety and asthma presents today with intentional poisoning.  Patient reports that she was binge drinking yesterday, was fighting with her kids today, and decided that she did not lose her live anymore.  She reports that she "wanted an easy way out."  She reportedly drank a glass of bleach and vinegar in an attempt to end her life.  Patient reports that she started feeling nauseous, and having severe abdominal pain shortly after.  She was laying on the couch when her brother came over and decided that she needed to go into the ED.  Patient reports that she is currently being treated for depression with Celexa.  She sees mental health, but has not been able to see them since the pandemic started in March.  She reports that last year in August she had a suicidal attempt with the same plan-drinking bleach and vinegar.  At that time she reports she was hospitalized medically and then in a psychiatric facility.  At this time patient reports throat and epigastric pain.  The pain is burning.  It is a 10 out of 10 severity.  Morphine did not help.  Nothing helps.  Coughing makes the pain worse.  The pain is constant.  She has associated nausea and vomiting.  Her emesis is nonbloody.  Patient does report that she was binge drinking yesterday.  She said she drinks 6- 7 40s, and a liquor that I cannot recognize the name of.  She also reports that a neighbor brought over moonshine and she had some of that.  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  She cannot handle the stressors of her children fighting with her and this brought on the cocaine use drinking and a suicide attempt.  Patient was seen by psychiatry and it was determined that  patient meets criteria for inpatient hospitalization.  There are currently no appropriate beds for patient at [facility], [facility].  They will be trying to place this patient.  9/9: Patient has undergone EGD today with noted erythema to the gastric mucosa but no erosions or ulcers.  She has been started on sucralfate by GI with recommendations for full liquid diet currently.  Assessment & Plan:   Active Problems:   Suicidal ideation   Abdominal pain   Suicidal ideation with intentional poisoning using bleach/vinegar -Psychiatry recommending placement to inpatient behavioral health unit once medically stable -Poison control recommendations being followed  Ongoing epigastric abdominal pain with erythematous mucosa on EGD secondary to above -Appreciate GI recommendations with initiation of Carafate -EGD evaluation with no severe, acute findings -Full liquid diet today to advance as tolerated -IV PPI daily  Hyponatremia -Improved, follow repeat BMP  Hypokalemia-improving -Replete and reevaluate in a.m. along with magnesium  History of asthma -No acute bronchospasms noted, continue evaluation  Hypertension-controlled -Resume home oral blood pressure medications -Hydralazine as needed   DVT prophylaxis: SCDs Code Status: Full Family Communication: None at bedside Disposition Plan: Advance diet as tolerated maintain on sucralfate for symptomatic management per GI.  Anticipate discharge in the next 24 hours.   Consultants:   GI  Procedures:   EGD 9/9  Antimicrobials:   None   Subjective: Patient seen and evaluated  today with ongoing epigastric abdominal pain and some mild nausea, but no emesis.  Objective: Vitals:   12/25/18 1103 12/25/18 1220 12/25/18 1230 12/25/18 1305  BP: 137/86 126/74 120/84 (!) 134/93  Pulse: 81 89 74 77  Resp: 18 (!) 23 16 19   Temp: 98.4 F (36.9 C) 98 F (36.7 C)  (!) 97.5 F (36.4 C)  TempSrc: Oral   Oral  SpO2: 99% 97% 97% 99%   Weight:      Height:        Intake/Output Summary (Last 24 hours) at 12/25/2018 1312 Last data filed at 12/25/2018 1225 Gross per 24 hour  Intake 300 ml  Output 600 ml  Net -300 ml   Filed Weights   12/24/18 1419 12/24/18 2238  Weight: 78 kg 83.4 kg    Examination:  General exam: Appears calm and comfortable  Respiratory system: Clear to auscultation. Respiratory effort normal. Cardiovascular system: S1 & S2 heard, RRR. No JVD, murmurs, rubs, gallops or clicks. No pedal edema. Gastrointestinal system: Abdomen is nondistended, soft and nontender. No organomegaly or masses felt. Normal bowel sounds heard. Central nervous system: Alert and oriented. No focal neurological deficits. Extremities: Symmetric 5 x 5 power. Skin: No rashes, lesions or ulcers Psychiatry: Judgement and insight appear normal. Mood & affect appropriate.     Data Reviewed: I have personally reviewed following labs and imaging studies  CBC: Recent Labs  Lab 12/24/18 1500 12/25/18 0529  WBC 8.0 6.1  NEUTROABS 4.2  --   HGB 12.1 11.2*  HCT 36.5 34.6*  MCV 87.1 90.1  PLT 592* [id number]*   Basic Metabolic Panel: Recent Labs  Lab 12/24/18 1500 12/25/18 0529  NA 134* 135  K 3.1* 3.4*  CL 100 104  CO2 19* 24  GLUCOSE 84 84  BUN 6 7  CREATININE 0.75 0.78  CALCIUM 8.9 8.1*   GFR: Estimated Creatinine Clearance: 79.6 mL/min (by C-G formula based on SCr of 0.78 mg/dL). Liver Function Tests: Recent Labs  Lab 12/24/18 1500 12/25/18 0529  AST 39 26  ALT 38 28  ALKPHOS 74 62  BILITOT 0.7 0.7  PROT 7.7 6.4*  ALBUMIN 3.9 3.4*   Recent Labs  Lab 12/24/18 1500  LIPASE 28   No results for input(s): AMMONIA in the last 168 hours. Coagulation Profile: No results for input(s): INR, PROTIME in the last 168 hours. Cardiac Enzymes: No results for input(s): CKTOTAL, CKMB, CKMBINDEX, TROPONINI in the last 168 hours. BNP (last 3 results) No results for input(s): PROBNP in the last 8760 hours. HbA1C: No  results for input(s): HGBA1C in the last 72 hours. CBG: No results for input(s): GLUCAP in the last 168 hours. Lipid Profile: No results for input(s): CHOL, HDL, LDLCALC, TRIG, CHOLHDL, LDLDIRECT in the last 72 hours. Thyroid Function Tests: Recent Labs    12/24/18 1500  TSH 1.533   Anemia Panel: No results for input(s): VITAMINB12, FOLATE, FERRITIN, TIBC, IRON, RETICCTPCT in the last 72 hours. Sepsis Labs: No results for input(s): PROCALCITON, LATICACIDVEN in the last 168 hours.  Recent Results (from the past 240 hour(s))  SARS Coronavirus 2 [facility] order, Performed in [facility] hospital lab) Nasopharyngeal Nasopharyngeal Swab     Status: None   Collection Time: 12/24/18  7:46 PM   Specimen: Nasopharyngeal Swab  Result Value Ref Range Status   SARS Coronavirus 2 NEGATIVE NEGATIVE Final    Comment: (NOTE) If result is NEGATIVE SARS-CoV-2 target nucleic acids are NOT DETECTED. The SARS-CoV-2 RNA is generally detectable in upper  and lower  respiratory specimens during the acute phase of infection. The lowest  concentration of SARS-CoV-2 viral copies this assay can detect is 250  copies / mL. A negative result does not preclude SARS-CoV-2 infection  and should not be used as the sole basis for treatment or other  patient management decisions.  A negative result may occur with  improper specimen collection / handling, submission of specimen other  than nasopharyngeal swab, presence of viral mutation(s) within the  areas targeted by this assay, and inadequate number of viral copies  (<250 copies / mL). A negative result must be combined with clinical  observations, patient history, and epidemiological information. If result is POSITIVE SARS-CoV-2 target nucleic acids are DETECTED. The SARS-CoV-2 RNA is generally detectable in upper and lower  respiratory specimens dur ing the acute phase of infection.  Positive  results are indicative of active infection with SARS-CoV-2.   Clinical  correlation with patient history and other diagnostic information is  necessary to determine patient infection status.  Positive results do  not rule out bacterial infection or co-infection with other viruses. If result is PRESUMPTIVE POSTIVE SARS-CoV-2 nucleic acids MAY BE PRESENT.   A presumptive positive result was obtained on the submitted specimen  and confirmed on repeat testing.  While 2019 novel coronavirus  (SARS-CoV-2) nucleic acids may be present in the submitted sample  additional confirmatory testing may be necessary for epidemiological  and / or clinical management purposes  to differentiate between  SARS-CoV-2 and other Sarbecovirus currently known to infect humans.  If clinically indicated additional testing with an alternate test  methodology [phone]) is advised. The SARS-CoV-2 RNA is generally  detectable in upper and lower respiratory sp ecimens during the acute  phase of infection. The expected result is Negative. Fact Sheet for Patients:  StrictlyIdeas.no Fact Sheet for Healthcare Providers: BankingDealers.co.za This test is not yet approved or cleared by the [place] FDA and has been authorized for detection and/or diagnosis of SARS-CoV-2 by FDA under an Emergency Use Authorization (EUA).  This EUA will remain in effect (meaning this test can be used) for the duration of the COVID-19 declaration under Section 564(b)(1) of the Act, 21 U.S.C. section 360bbb-3(b)(1), unless the authorization is terminated or revoked sooner. Performed at [facility], [address], [place], [place] [address]          Radiology Studies: Ct Chest W Contrast  Result Date: 12/24/2018 CLINICAL DATA:  Ingestion bleach.  Epigastric pain. EXAM: CT CHEST, ABDOMEN, AND PELVIS WITH CONTRAST TECHNIQUE: Multidetector CT imaging of the chest, abdomen and pelvis was performed following the standard protocol during bolus  administration of intravenous contrast. CONTRAST:  190mL OMNIPAQUE IOHEXOL 300 MG/ML  SOLN COMPARISON:  CT abdomen and pelvis 03/21/2018.  CTA chest 05/10/2018 FINDINGS: CT CHEST FINDINGS Cardiovascular: Heart is normal size. Aorta is normal caliber. Mediastinum/Nodes: No mediastinal, hilar, or axillary adenopathy. Trachea and esophagus are unremarkable. Thyroid unremarkable. Lungs/Pleura: Lungs are clear. No focal airspace opacities or suspicious nodules. No effusions. Musculoskeletal: Chest wall soft tissues are unremarkable. No acute bony abnormality. CT ABDOMEN PELVIS FINDINGS Hepatobiliary: Diffuse low-density throughout the liver compatible with fatty infiltration. No focal abnormality. Gallbladder unremarkable. Pancreas: No focal abnormality or ductal dilatation. Spleen: No focal abnormality.  Normal size. Adrenals/Urinary Tract: No adrenal abnormality. No focal renal abnormality. No stones or hydronephrosis. Urinary bladder is unremarkable. Stomach/Bowel: Stomach is unremarkable. Large and small bowel decompressed, unremarkable. No evidence of perforation. Appendix normal. Vascular/Lymphatic: No evidence of aneurysm or adenopathy.  Reproductive: Uterus and adnexa unremarkable.  No mass. Other: No free fluid or free air. Musculoskeletal: No acute bony abnormality. Degenerative changes in the lumbar spine. IMPRESSION: No acute findings in the chest, abdomen or pelvis. Fatty infiltration of the liver. Electronically Signed   By: Rolm Baptise M.D.   On: 12/24/2018 19:21   Ct Abdomen Pelvis W Contrast  Result Date: 12/24/2018 CLINICAL DATA:  Ingestion bleach.  Epigastric pain. EXAM: CT CHEST, ABDOMEN, AND PELVIS WITH CONTRAST TECHNIQUE: Multidetector CT imaging of the chest, abdomen and pelvis was performed following the standard protocol during bolus administration of intravenous contrast. CONTRAST:  186mL OMNIPAQUE IOHEXOL 300 MG/ML  SOLN COMPARISON:  CT abdomen and pelvis 03/21/2018.  CTA chest 05/10/2018  FINDINGS: CT CHEST FINDINGS Cardiovascular: Heart is normal size. Aorta is normal caliber. Mediastinum/Nodes: No mediastinal, hilar, or axillary adenopathy. Trachea and esophagus are unremarkable. Thyroid unremarkable. Lungs/Pleura: Lungs are clear. No focal airspace opacities or suspicious nodules. No effusions. Musculoskeletal: Chest wall soft tissues are unremarkable. No acute bony abnormality. CT ABDOMEN PELVIS FINDINGS Hepatobiliary: Diffuse low-density throughout the liver compatible with fatty infiltration. No focal abnormality. Gallbladder unremarkable. Pancreas: No focal abnormality or ductal dilatation. Spleen: No focal abnormality.  Normal size. Adrenals/Urinary Tract: No adrenal abnormality. No focal renal abnormality. No stones or hydronephrosis. Urinary bladder is unremarkable. Stomach/Bowel: Stomach is unremarkable. Large and small bowel decompressed, unremarkable. No evidence of perforation. Appendix normal. Vascular/Lymphatic: No evidence of aneurysm or adenopathy. Reproductive: Uterus and adnexa unremarkable.  No mass. Other: No free fluid or free air. Musculoskeletal: No acute bony abnormality. Degenerative changes in the lumbar spine. IMPRESSION: No acute findings in the chest, abdomen or pelvis. Fatty infiltration of the liver. Electronically Signed   By: Rolm Baptise M.D.   On: 12/24/2018 19:21        Scheduled Meds:  albuterol  2.5 mg Inhalation BID   fluticasone  1 spray Each Nare Daily   influenza vac split quadrivalent PF  0.5 mL Intramuscular Tomorrow-1000   pantoprazole (PROTONIX) IV  40 mg Intravenous Q24H   potassium chloride  40 mEq Oral Once   sucralfate  1 g Oral TID WC & HS   Continuous Infusions:  sodium chloride 100 mL/hr at 12/24/18 2350     LOS: 1 day    Time spent: 30 minutes    Bianco Cange D Manuella Ghazi, DO [facility] Pager [phone]  If 7PM-7AM, please contact night-coverage [email or web address] Password [email or web address] 12/25/2018, 1:12 PM

## 2018-12-25 NOTE — Transfer of Care (Signed)
Immediate Anesthesia Transfer of Care Note  Patient: Kathryn [name]  Procedure(s) Performed: ESOPHAGOGASTRODUODENOSCOPY (EGD) WITH PROPOFOL (N/A )  Patient Location: PACU  Anesthesia Type:General  Level of Consciousness: awake and alert   Airway & Oxygen Therapy: Patient Spontanous Breathing  Post-op Assessment: Report given to RN  Post vital signs: Reviewed and stable  Last Vitals:  Vitals Value Taken Time  BP 126/74 12/25/18 1220  Temp    Pulse 81 12/25/18 1225  Resp 25 12/25/18 1225  SpO2 97 % 12/25/18 1225  Vitals shown include unvalidated device data.  Last Pain:  Vitals:   12/25/18 1203  TempSrc:   PainSc: 10-Worst pain ever      Patients Stated Pain Goal: 4 ([id number])  Complications: No apparent anesthesia complications

## 2018-12-25 NOTE — Progress Notes (Signed)
Patient requested "Can someone call my probation officer and let her know why I missed my appointment this morning. I forgot all about that." patient provided contact name and number for probation officer - Missy Sabins [phone]. Provided Sharley Hunnicutt, CM with contact information to call her. Donavan Foil, RN

## 2018-12-25 NOTE — Addendum Note (Signed)
Addendum  created 12/25/18 1530 by Ollen Bowl, CRNA   Charge Capture section accepted

## 2018-12-25 NOTE — Op Note (Signed)
[facility] Patient Name: Kathryn [name] Procedure Date: 12/25/2018 11:47 AM MRN: [id number] Date of Birth: 04/01/1971 Attending MD: Hildred Laser , MD CSN: [id number] Age: 48 Admit Type: Outpatient Procedure:                Upper GI endoscopy Indications:              Epigastric abdominal pain after ingesting bleach. Providers:                Hildred Laser, MD, Gerome Sam, RN, Raphael Gibney, Technician Referring MD:             Heath Lark, DO Medicines:                Monitored Anesthesia Care Complications:            No immediate complications. Estimated Blood Loss:     Estimated blood loss: none. Procedure:                Pre-Anesthesia Assessment:                           - Prior to the procedure, a History and Physical                            was performed, and patient medications and                            allergies were reviewed. The patient's tolerance of                            previous anesthesia was also reviewed. The risks                            and benefits of the procedure and the sedation                            options and risks were discussed with the patient.                            All questions were answered, and informed consent                            was obtained. Prior Anticoagulants: The patient has                            taken no previous anticoagulant or antiplatelet                            agents except for aspirin. ASA Grade Assessment:                            III - A patient with severe systemic disease. After  reviewing the risks and benefits, the patient was                            deemed in satisfactory condition to undergo the                            procedure.                           After obtaining informed consent, the endoscope was                            passed under direct vision. Throughout the                            procedure, the  patient's blood pressure, pulse, and                            oxygen saturations were monitored continuously. The                            upper GI endoscopy was accomplished without                            difficulty. The patient tolerated the procedure                            well. The [id number] scope was introduced                            through the and advanced to the second part of                            duodenum. Scope In: 12:08:33 PM Scope Out: 12:12:54 PM Total Procedure Duration: 0 hours 4 minutes 21 seconds  Findings:      normal hypopharynx.      The examined esophagus was normal.      The Z-line was irregular and was found 35 cm from the incisors.      A 3 cm hiatal hernia was present.      Mildly erythematous mucosa without bleeding was found in the gastric       body.      The exam of the stomach was otherwise normal.      The duodenal bulb and second portion of the duodenum were normal. Impression:               - normal hypopharynx.                           - Normal esophagus.                           - Z-line irregular, 35 cm from the incisors.                           - 3 cm hiatal hernia.                           -  Erythematous mucosa in the gastric body.                           - Normal duodenal bulb and second portion of the                            duodenum.                           - No specimens collected. Moderate Sedation:      Per Anesthesia Care Recommendation:           - Return patient to hospital ward for ongoing care.                           - Full liquid diet today.                           - Continue present medications.                           - Use sucralfate suspension 1 gram PO QID. Procedure Code(s):        --- Professional ---                           [id number], Esophagogastroduodenoscopy, flexible,                            transoral; diagnostic, including collection of                             specimen(s) by brushing or washing, when performed                            (separate procedure) Diagnosis Code(s):        --- Professional ---                           K22.8, Other specified diseases of esophagus                           K44.9, Diaphragmatic hernia without obstruction or                            gangrene                           K31.89, Other diseases of stomach and duodenum                           R10.13, Epigastric pain CPT copyright 2019 American [employer] Association. All rights reserved. The codes documented in this report are preliminary and upon coder review may  be revised to meet current compliance requirements. Hildred Laser, MD Hildred Laser, MD 12/25/2018 12:29:30 PM This report has been signed electronically. Number of Addenda: 0

## 2018-12-25 NOTE — Consult Note (Addendum)
Referring Provider: No ref. provider found Primary Care Physician:  Jani Gravel, MD Primary Gastroenterologist:  Althia Forts   Reason for Consultation: abdominal pain  HPI: Kathryn [name] is a 48 y.o. female with a past medical history of anxiety, depression, cocaine drug abuse, asthma, COPD, hypertension, chronic back pain, neuropathy, osteoarthritis and GERD. She presented to [facility] emergency room 12/24/2018 with hallucinations with suicidal ideation.  She reported being depressed due to using cocaine and she lost custody of her grandchildren. She reported drinking 2 to 3 ounces of bleach mixed with vinegar earlier in the morning the intention of killing herself.  She developed upper abdominal pain shortly after ingesting the bleach and vinegar. She presented to [facility] for further evaluation and treatment.   She reports having a history of GERD. She takes Omeprazole 26m once daily. She takes ASA 3243monce daily. No other NSAID use. She drinks one 40 ounce beer twice monthly. She denies excessive alcohol use. She smokes cocaine intermittently, last smoked 2 days ago. She has chronic constipation. She passes small hard green stools most days. No rectal bleeding or melena. Father was diagnosed with colon cancer at the age of 4938he died from cancer complications at the age of 49123She's never had a screening colonoscopy. Currently, she complains of having moderate upper abdominal pain. Mild nausea. No  vomiting. No BM for the past 4 days. Mother died at the age of 4930econdary to Etoh cirrhosis.  ED course 12/25/2018: Poison controlled was contacted by the ED physician.  GI consult for possible EGD was recommended.  Protonix, fentanyl and IV fluids were administered.  She remains NPO.  Labs: Sodium 134.  Potassium 3.1.  Glucose 84.  Creatinine 0.75.  Alk phos 74.  Albumin 3.9.  Lipase 28.  AST 39.  ALT 38.  Total bilirubin 0.7.  WBC 8.0.  Hemoglobin 12.1.  Hematocrit 36.5.  MCV 87.1.   Platelet 592.  Acetaminophen level less than 10.  Salicylate level less than 7.0.  hCG less than 5.0.  TSH 1.533.  Ethyl alcohol less than 10.  Urine toxicology pending. SARS coronavirus 2 negative.  Chest/abdominal/pelvic with contrast CT: No acute findings to the chest, abdomen or pelvis. Liver with fatty infiltration noted.  Past Medical History:  Diagnosis Date   Anxiety    Arthritis    Asthma    Chronic back pain    COPD (chronic obstructive pulmonary disease) (HCC)    Chronic bronchitis   Depression    GERD (gastroesophageal reflux disease)    Hypertension    Neuropathy    Osteoarthritis of left knee, 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   Past Surgical History:  Procedure Laterality Date   PATELLA-FEMORAL ARTHROPLASTY Left 10/08/2018   Procedure: PATELLA-FEMORAL ARTHROPLASTY;  Surgeon: LaMarchia BondMD;  Location: [facility] ORS;  Service: Orthopedics;  Laterality: Left;   TUBAL LIGATION      Prior to Admission medications   Medication Sig Start Date End Date Taking? Authorizing Provider  amLODipine (NORVASC) 5 MG tablet Take 5 mg by mouth daily.   Yes [provider]  methocarbamol (ROBAXIN) 750 MG tablet Take 1 tablet (750 mg total) by mouth 3 (three) times daily as needed for muscle spasms. 10/08/18  Yes LaMarchia BondMD  omeprazole (PRILOSEC) 20 MG capsule Take 1 po BID x 2 weeks then once a day Patient taking differently: Take 20 mg by mouth daily.  03/24/18  Yes KnRolland PorterMD  oxyCODONE (ROXICODONE) 5 MG immediate release tablet Take  1 tablet (5 mg total) by mouth every 4 (four) hours as needed for severe pain. 10/08/18  Yes Marchia Bond, MD  albuterol (PROVENTIL HFA;VENTOLIN HFA) 108 (90 Base) MCG/ACT inhaler Inhale 2 puffs into the lungs 2 (two) times daily.    [provider]  albuterol (PROVENTIL) (2.5 MG/3ML) 0.083% nebulizer solution Take 2.5 mg by nebulization every 6 (six) hours as needed for wheezing or shortness of breath.    [provider]  aspirin EC 325 MG tablet Take 1 tablet (325 mg total) by mouth 2 (two) times daily. 10/08/18   Marchia Bond, MD  benzonatate (TESSALON) 200 MG capsule Take 1 capsule (200 mg total) by mouth 3 (three) times daily as needed for cough. Swallow whole, do not chew Patient taking differently: Take 200 mg by mouth 3 (three) times daily. Swallow whole, do not chew 09/18/18   Triplett, Tammy, PA-C  cholecalciferol (VITAMIN D3) 25 MCG (1000 UT) tablet Take 2,000 Units by mouth daily.    [provider]  citalopram (CELEXA) 20 MG tablet Take 20 mg by mouth daily.    [provider]  famotidine (PEPCID) 20 MG tablet Take 1 tablet (20 mg total) by mouth 2 (two) times daily. 02/06/18   Virgel Manifold, MD  fluticasone (FLONASE) 50 MCG/ACT nasal spray Place 1 spray into both nostrils daily.    [provider]  gabapentin (NEURONTIN) 300 MG capsule Take 1 capsule (300 mg total) by mouth 3 (three) times daily. 12/28/17   Starkes-Perry, Gayland Curry, FNP  hydrochlorothiazide (MICROZIDE) 12.5 MG capsule Take 12.5 mg by mouth daily.    [provider]  loratadine (CLARITIN) 10 MG tablet Take 10 mg by mouth daily.     [provider]  mirtazapine (REMERON) 30 MG tablet Take 30 mg by mouth at bedtime.    [provider]  ondansetron (ZOFRAN) 4 MG tablet Take 1 tablet (4 mg total) by mouth every 8 (eight) hours as needed for nausea or vomiting. 10/08/18   Marchia Bond, MD  potassium chloride SA (K-DUR,KLOR-CON) 20 MEQ tablet Take 1 tablet (20 mEq total) by mouth 2 (two) times daily. 04/19/18   Evalee Jefferson, PA-C  sennosides-docusate sodium (SENOKOT-S) 8.6-50 MG tablet Take 2 tablets by mouth daily. 10/08/18   Marchia Bond, MD  traZODone (DESYREL) 150 MG tablet Take 150-300 mg by mouth at bedtime as needed for sleep.    [provider]    Current Facility-Administered Medications  Medication Dose Route Frequency Provider Last Rate Last Dose   0.9 %  sodium  chloride infusion   Intravenous Continuous Zierle-Ghosh, Asia B, DO 100 mL/hr at 12/24/18 2350     albuterol (PROVENTIL) (2.5 MG/3ML) 0.083% nebulizer solution 2.5 mg  2.5 mg Nebulization Q6H PRN Zierle-Ghosh, Asia B, DO       albuterol (PROVENTIL) (2.5 MG/3ML) 0.083% nebulizer solution 2.5 mg  2.5 mg Inhalation BID Zierle-Ghosh, Asia B, DO       fluticasone (FLONASE) 50 MCG/ACT nasal spray 1 spray  1 spray Each Nare Daily Zierle-Ghosh, Asia B, DO       hydrALAZINE (APRESOLINE) injection 10 mg  10 mg Intravenous Q6H PRN Zierle-Ghosh, Asia B, DO       influenza vac split quadrivalent PF (FLUARIX) injection 0.5 mL  0.5 mL Intramuscular Tomorrow-1000 Zierle-Ghosh, Asia B, DO       morphine 2 MG/ML injection 2 mg  2 mg Intravenous Q4H PRN Zierle-Ghosh, Asia B, DO       ondansetron (ZOFRAN)  tablet 4 mg  4 mg Oral Q6H PRN Zierle-Ghosh, Asia B, DO       Or   ondansetron (ZOFRAN) injection 4 mg  4 mg Intravenous Q6H PRN Zierle-Ghosh, Asia B, DO        Allergies as of 12/24/2018 - Review Complete 12/24/2018  Allergen Reaction Noted   Clonopin [clonazepam] Anaphylaxis 06/26/2014   Fish allergy Anaphylaxis, Shortness Of Breath, and Swelling 06/12/2013   Flexeril [cyclobenzaprine hcl] Shortness Of Breath 11/04/2010   Ibuprofen Anaphylaxis and Hives 11/04/2010   Shellfish allergy Anaphylaxis 10/08/2018   Ace inhibitors Cough 05/11/2017   Tramadol Nausea And Vomiting 06/12/2013    Family History  Problem Relation Age of Onset   Gout Paternal Grandfather    Cirrhosis Paternal Grandfather    Hypertension Paternal Grandmother    Aneurysm Paternal Grandmother    Cirrhosis Maternal Grandmother    Cirrhosis Maternal Grandfather    Cancer Father    Cirrhosis Father    Cirrhosis Mother    Breast cancer Sister    Hypertension Sister    Bronchitis Daughter    Bronchitis Daughter    Asthma Son    Bronchitis Son    Migraines Neg Hx     Social History   Socioeconomic  History   Marital status: Single    Spouse name: Not on file   Number of children: Not on file   Years of education: Not on file   Highest education level: Not on file  Occupational History   Not on file  Social Needs   Financial resource strain: Not on file   Food insecurity    Worry: Not on file    Inability: Not on file   Transportation needs    Medical: Not on file    Non-medical: Not on file  Tobacco Use   Smoking status: Former Smoker    Types: Cigarettes    Quit date: 2016    Years since quitting: 4.6   Smokeless tobacco: Never Used   Tobacco comment: wears nicotine patches  Substance and Sexual Activity   Alcohol use: Yes    Comment: occ   Drug use: Not Currently    Types: Cocaine    Comment: crack  last used 2016   Sexual activity: Yes    Birth control/protection: Surgical    Comment: tubal  Lifestyle   Physical activity    Days per week: Not on file    Minutes per session: Not on file   Stress: Not on file  Relationships   Social connections    Talks on phone: Not on file    Gets together: Not on file    Attends religious service: Not on file    Active member of club or organization: Not on file    Attends meetings of clubs or organizations: Not on file    Relationship status: Not on file   Intimate partner violence    Fear of current or ex partner: Not on file    Emotionally abused: Not on file    Physically abused: Not on file    Forced sexual activity: Not on file  Other Topics Concern   Not on file  Social History Narrative   Not on file    Review of Systems: Gen: Denies fever, sweats or chills. No weight loss.  CV: Denies chest pain, palpitations or edema. Resp: Denies cough, shortness of breath of hemoptysis.  GI: See HPI.  GU : Denies urinary burning, blood in urine, increased urinary  frequency or incontinence. MS: Denies joint pain, muscles aches or weakness. Derm: Denies rash, itchiness, skin lesions or unhealing  ulcers. Psych: See HPI. Heme: Denies bruising, bleeding. Neuro:  Denies headaches, dizziness or paresthesias. Endo:  Denies any problems with DM, thyroid or adrenal function.  Physical Exam: Vital signs in last 24 hours: Temp:  [97.7 F (36.5 C)-98 F (36.7 C)] 98 F (36.7 C) (09/09 0555) Pulse Rate:  [81-104] 81 (09/09 0555) Resp:  [16-41] 18 (09/09 0555) BP: (112-142)/(62-96) 137/88 (09/09 0555) SpO2:  [93 %-100 %] 97 % (09/09 0555) Weight:  [78 kg-83.4 kg] 83.4 kg (09/08 2238)   General:   Alert,  well-developed, well-nourished, pleasant and cooperative in NAD. Head:  Normocephalic and atraumatic. Eyes:  Sclera clear, no icterus. Conjunctiva pink. Ears:  Normal auditory acuity. Nose:  No deformity, discharge or lesions. Mouth:  No deformity or lesions.  Tongue dry with white coating.  Neck:  Supple. Lungs:  Expiratory wheezes throughout.  Heart:  RRR, no murmurs.  Abdomen: Moderate upper abdominal tenderness without rebound or guarding, + BS x 4 quads, no HSM.   Rectal:  Deferred  Msk:  Symmetrical without gross deformities. . Pulses:  Normal pulses noted. Extremities:  Without clubbing or edema. Neurologic:  Alert and  oriented x4;  grossly normal neurologically. Skin:  Intact without significant lesions or rashes.. Psych:  Alert and cooperative. Normal mood and affect. Tearful.   Intake/Output from previous day: No intake/output data recorded. Intake/Output this shift: No intake/output data recorded.  Lab Results: Recent Labs    12/24/18 1500 12/25/18 0529  WBC 8.0 6.1  HGB 12.1 11.2*  HCT 36.5 34.6*  PLT 592* 502*   BMET Recent Labs    12/24/18 1500 12/25/18 0529  NA 134* 135  K 3.1* 3.4*  CL 100 104  CO2 19* 24  GLUCOSE 84 84  BUN 6 7  CREATININE 0.75 0.78  CALCIUM 8.9 8.1*   LFT Recent Labs    12/25/18 0529  PROT 6.4*  ALBUMIN 3.4*  AST 26  ALT 28  ALKPHOS 62  BILITOT 0.7   PT/INR No results for input(s): LABPROT, INR in the last 72  hours. Hepatitis Panel No results for input(s): HEPBSAG, HCVAB, HEPAIGM, HEPBIGM in the last 72 hours.    Studies/Results: Ct Chest W Contrast  Result Date: 12/24/2018 CLINICAL DATA:  Ingestion bleach.  Epigastric pain. EXAM: CT CHEST, ABDOMEN, AND PELVIS WITH CONTRAST TECHNIQUE: Multidetector CT imaging of the chest, abdomen and pelvis was performed following the standard protocol during bolus administration of intravenous contrast. CONTRAST:  125m OMNIPAQUE IOHEXOL 300 MG/ML  SOLN COMPARISON:  CT abdomen and pelvis 03/21/2018.  CTA chest 05/10/2018 FINDINGS: CT CHEST FINDINGS Cardiovascular: Heart is normal size. Aorta is normal caliber. Mediastinum/Nodes: No mediastinal, hilar, or axillary adenopathy. Trachea and esophagus are unremarkable. Thyroid unremarkable. Lungs/Pleura: Lungs are clear. No focal airspace opacities or suspicious nodules. No effusions. Musculoskeletal: Chest wall soft tissues are unremarkable. No acute bony abnormality. CT ABDOMEN PELVIS FINDINGS Hepatobiliary: Diffuse low-density throughout the liver compatible with fatty infiltration. No focal abnormality. Gallbladder unremarkable. Pancreas: No focal abnormality or ductal dilatation. Spleen: No focal abnormality.  Normal size. Adrenals/Urinary Tract: No adrenal abnormality. No focal renal abnormality. No stones or hydronephrosis. Urinary bladder is unremarkable. Stomach/Bowel: Stomach is unremarkable. Large and small bowel decompressed, unremarkable. No evidence of perforation. Appendix normal. Vascular/Lymphatic: No evidence of aneurysm or adenopathy. Reproductive: Uterus and adnexa unremarkable.  No mass. Other: No free fluid or free air. Musculoskeletal: No  acute bony abnormality. Degenerative changes in the lumbar spine. IMPRESSION: No acute findings in the chest, abdomen or pelvis. Fatty infiltration of the liver. Electronically Signed   By: Rolm Baptise M.D.   On: 12/24/2018 19:21   Ct Abdomen Pelvis W Contrast  Result  Date: 12/24/2018 CLINICAL DATA:  Ingestion bleach.  Epigastric pain. EXAM: CT CHEST, ABDOMEN, AND PELVIS WITH CONTRAST TECHNIQUE: Multidetector CT imaging of the chest, abdomen and pelvis was performed following the standard protocol during bolus administration of intravenous contrast. CONTRAST:  119m OMNIPAQUE IOHEXOL 300 MG/ML  SOLN COMPARISON:  CT abdomen and pelvis 03/21/2018.  CTA chest 05/10/2018 FINDINGS: CT CHEST FINDINGS Cardiovascular: Heart is normal size. Aorta is normal caliber. Mediastinum/Nodes: No mediastinal, hilar, or axillary adenopathy. Trachea and esophagus are unremarkable. Thyroid unremarkable. Lungs/Pleura: Lungs are clear. No focal airspace opacities or suspicious nodules. No effusions. Musculoskeletal: Chest wall soft tissues are unremarkable. No acute bony abnormality. CT ABDOMEN PELVIS FINDINGS Hepatobiliary: Diffuse low-density throughout the liver compatible with fatty infiltration. No focal abnormality. Gallbladder unremarkable. Pancreas: No focal abnormality or ductal dilatation. Spleen: No focal abnormality.  Normal size. Adrenals/Urinary Tract: No adrenal abnormality. No focal renal abnormality. No stones or hydronephrosis. Urinary bladder is unremarkable. Stomach/Bowel: Stomach is unremarkable. Large and small bowel decompressed, unremarkable. No evidence of perforation. Appendix normal. Vascular/Lymphatic: No evidence of aneurysm or adenopathy. Reproductive: Uterus and adnexa unremarkable.  No mass. Other: No free fluid or free air. Musculoskeletal: No acute bony abnormality. Degenerative changes in the lumbar spine. IMPRESSION: No acute findings in the chest, abdomen or pelvis. Fatty infiltration of the liver. Electronically Signed   By: KRolm BaptiseM.D.   On: 12/24/2018 19:21    IMPRESSION/PLAN:  1. 48y.o. female with depression with suicide attempt demonstrated by ingesting bleach and vinegar on 9/8 presents with upper abdominal pain. Abd/pelvic CT unremarkable.   -EGD  today with Dr. RLaural Golden benefits and risks discussed with patient including risk with sedation, risk of bleeding perforation and infection  ADDENDUM: urine toxicology PENDING, EGD most likely deferred until urine toxicology report received, further recommendations from  Dr. RLaural Goldenlater today -NPO  -continue IVF NS @ 100c/hr -Protonix 433mIV QD  2. Normocytic anemia. Hg 11.2.  -check iron and Vitamin B12 levels -EGD today, eventual colonoscopy as outpatient  -repeat CBC in am  3. Severe depression with suicide attempt -behavioral health to follow, sitter at bedside   4. Asthma, expiratory wheezes on exam -management per hospitalist   5. Family history of colon cancer -will need screening colonoscopy as outpatient    CoNoralyn Pick9/12/2018, 7:55 AM

## 2018-12-25 NOTE — Anesthesia Preprocedure Evaluation (Signed)
Anesthesia Evaluation  Patient identified by MRN, date of birth, ID band Patient awake    Reviewed: Allergy & Precautions, NPO status , Patient's Chart, lab work & pertinent test results  Airway Mallampati: III  TM Distance: >3 FB Neck ROM: Full    Dental no notable dental hx. (+) Teeth Intact   Pulmonary neg pulmonary ROS, Patient abstained from smoking., former smoker,    Pulmonary exam normal breath sounds clear to auscultation       Cardiovascular Exercise Tolerance: Poor hypertension, negative cardio ROS Normal cardiovascular examII Rhythm:Regular Rate:Normal  States limited ET -due to Knee issues  Denies CP   Neuro/Psych Anxiety Depression Suicidal -drank bleach Monday  Not the first time doing this negative neurological ROS  negative psych ROS   GI/Hepatic GERD  Medicated and Controlled,(+)     substance abuse  cocaine use, Reports smoking Cocaine Monday + toxicology Here for EGD in spite of negative CT For Drinking bleach    Endo/Other  negative endocrine ROS  Renal/GU negative Renal ROS  negative genitourinary   Musculoskeletal  (+) Arthritis , Osteoarthritis,    Abdominal   Peds negative pediatric ROS (+)  Hematology negative hematology ROS (+)   Anesthesia Other Findings   Reproductive/Obstetrics negative OB ROS                             Anesthesia Physical Anesthesia Plan  ASA: III and emergent  Anesthesia Plan: General   Post-op Pain Management:    Induction: Intravenous  PONV Risk Score and Plan: 3 and Propofol infusion, TIVA and Treatment may vary due to age or medical condition  Airway Management Planned: Nasal Cannula and Simple Face Mask  Additional Equipment:   Intra-op Plan:   Post-operative Plan: Extubation in OR  Informed Consent: I have reviewed the patients History and Physical, chart, labs and discussed the procedure including the risks,  benefits and alternatives for the proposed anesthesia with the patient or authorized representative who has indicated his/her understanding and acceptance.     Dental advisory given  Plan Discussed with: CRNA  Anesthesia Plan Comments: (Plan Full PPE use  Plan GA with GETA as needed d/w pt -WTP with same after Q&A TIVA as tolerated  D/w pt very increased risk due to recent cocaine use -WTP D/w pt possibility of post procedure ventilation -WTP -full code)        Anesthesia Quick Evaluation

## 2018-12-25 NOTE — Care Management (Addendum)
Received message from bedside RN, that patient is requesting that her probation officer be notified that she is in the hospital.   Patient gives name of Kathryn [name], [id number]. CM placed called, wrong number. Unable to follow up with patient at this time as she is having a procedure.

## 2018-12-25 NOTE — Anesthesia Postprocedure Evaluation (Signed)
Anesthesia Post Note  Patient: Kathryn [name]  Procedure(s) Performed: ESOPHAGOGASTRODUODENOSCOPY (EGD) WITH PROPOFOL (N/A )  Patient location during evaluation: PACU Anesthesia Type: General Level of consciousness: awake and alert and oriented Pain management: pain level controlled Vital Signs Assessment: post-procedure vital signs reviewed and stable Respiratory status: spontaneous breathing Cardiovascular status: blood pressure returned to baseline and stable Postop Assessment: no apparent nausea or vomiting Anesthetic complications: no     Last Vitals:  Vitals:   12/25/18 1103 12/25/18 1220  BP: 137/86 126/74  Pulse: 81 89  Resp: 18 (!) 23  Temp: 36.9 C 36.7 C  SpO2: 99% 97%    Last Pain:  Vitals:   12/25/18 1220  TempSrc:   PainSc: 9                  Caterina Racine

## 2018-12-25 NOTE — Addendum Note (Signed)
Addendum  created 12/25/18 1240 by Ollen Bowl, CRNA   Charge Capture section accepted

## 2018-12-25 NOTE — Progress Notes (Signed)
Brief EGD note.  Erythema to epiglottis without ulceration. Normal esophageal mucosa. Small sliding hiatal hernia with irregular GE junction. Erythema the gastric mucosa body but no erosions or ulcers. Normal bulbar and post bulbar mucosa.

## 2018-12-25 NOTE — Progress Notes (Signed)
Spoke with [place] with poison control. Update given no recommendations at this time.

## 2018-12-26 ENCOUNTER — Encounter (HOSPITAL_COMMUNITY): Payer: Self-pay | Admitting: Internal Medicine

## 2018-12-26 LAB — BASIC METABOLIC PANEL
Anion gap: 7 (ref 5–15)
BUN: <5 mg/dL — ABNORMAL LOW (ref 6–20)
CO2: 24 mmol/L (ref 22–32)
Calcium: 8.2 mg/dL — ABNORMAL LOW (ref 8.9–10.3)
Chloride: 107 mmol/L (ref 98–111)
Creatinine, Ser: 0.73 mg/dL (ref 0.44–1.00)
GFR calc Af Amer: >60 mL/min (ref >60)
GFR calc non Af Amer: >60 mL/min (ref >60)
Glucose, Bld: 90 mg/dL (ref 70–99)
Potassium: 3.6 mmol/L (ref 3.5–5.1)
Sodium: 138 mmol/L (ref 135–145)

## 2018-12-26 LAB — CBC WITH DIFFERENTIAL/PLATELET
Abs Immature Granulocytes: 0.01 10*3/uL (ref 0.00–0.07)
Basophils Absolute: 0 10*3/uL (ref 0.0–0.1)
Basophils Relative: 1 %
Eosinophils Absolute: 0.1 10*3/uL (ref 0.0–0.5)
Eosinophils Relative: 3 %
HCT: 34.1 % — ABNORMAL LOW (ref 36.0–46.0)
Hemoglobin: 10.6 g/dL — ABNORMAL LOW (ref 12.0–15.0)
Immature Granulocytes: 0 %
Lymphocytes Relative: 40 %
Lymphs Abs: 2.2 10*3/uL (ref 0.7–4.0)
MCH: 28.7 pg (ref 26.0–34.0)
MCHC: 31.1 g/dL (ref 30.0–36.0)
MCV: 92.4 fL (ref 80.0–100.0)
Monocytes Absolute: 0.6 10*3/uL (ref 0.1–1.0)
Monocytes Relative: 11 %
Neutro Abs: 2.5 10*3/uL (ref 1.7–7.7)
Neutrophils Relative %: 45 %
Platelets: 491 10*3/uL — ABNORMAL HIGH (ref 150–400)
RBC: 3.69 MIL/uL — ABNORMAL LOW (ref 3.87–5.11)
RDW: 15.6 % — ABNORMAL HIGH (ref 11.5–15.5)
WBC: 5.4 10*3/uL (ref 4.0–10.5)
nRBC: 0 % (ref 0.0–0.2)

## 2018-12-26 LAB — FERRITIN: Ferritin: 52 ng/mL (ref 11–307)

## 2018-12-26 LAB — IRON AND TIBC
Iron: 45 ug/dL (ref 28–170)
Saturation Ratios: 12 % (ref 10.4–31.8)
TIBC: 374 ug/dL (ref 250–450)
UIBC: 329 ug/dL

## 2018-12-26 LAB — VITAMIN B12: Vitamin B-12: 468 pg/mL (ref 180–914)

## 2018-12-26 LAB — MAGNESIUM: Magnesium: 2 mg/dL (ref 1.7–2.4)

## 2018-12-26 LAB — HIV ANTIBODY (ROUTINE TESTING W REFLEX): HIV Screen 4th Generation wRfx: NONREACTIVE

## 2018-12-26 MED ORDER — PANTOPRAZOLE SODIUM 40 MG PO TBEC: 40.0000 mg | DELAYED_RELEASE_TABLET | Freq: Every day | ORAL | 1 refills | Status: DC

## 2018-12-26 MED ORDER — SUCRALFATE 1 GM/10ML PO SUSP: 1.0000 g | Freq: Three times a day (TID) | ORAL | 0 refills | Status: DC

## 2018-12-26 NOTE — Discharge Summary (Signed)
Physician Discharge Summary  Kathryn [name] [id number] DOB: May 17, 1970 DOA: 12/24/2018  PCP: Jani Gravel, MD  Admit date: 12/24/2018  Discharge date: 12/26/2018  Admitted From:Home  Disposition:  Behavioral health facility  Recommendations for Outpatient Follow-up:  1. Follow up with PCP in 1-2 weeks 2. Continue on Carafate and PPI as prescribed  Home Health: None  Equipment/Devices: None  Discharge Condition: Stable  CODE STATUS: Full  Diet recommendation: Heart Healthy  Brief/Interim Summary: Per HPI: CynthiaPriceis a48 y.o.female,with history of neuropathy, hypertension, GERD, depression, COPD, anxiety and asthma presents today with intentional poisoning. Patient reports that she was binge drinking yesterday, was fighting with her kids today, and decided that she did not lose her live anymore. She reports that she "wanted an easy way out." She reportedly drank a glass of bleach and vinegar in an attempt to end her life. Patient reports that she started feeling nauseous, and having severe abdominal pain shortly after. She was laying on the couch when her brother came over and decided that she needed to go into the ED. Patient reports that she is currently being treated for depression with Celexa. She sees mental health, but has not been able to see them since the pandemic started in March. She reports that last year in August she had a suicidal attempt with the same plan-drinking bleach and vinegar. At that time she reports she was hospitalized medically and then in a psychiatric facility. At this time patient reports throat and epigastric pain. The pain is burning. It is a 10 out of 10 severity. Morphine did not help. Nothing helps. Coughing makes the pain worse. The pain is constant. She has associated nausea and vomiting. Her emesis is nonbloody.  Patient does report that she was binge drinking yesterday. She said she drinks 6-7 40s,and a liquor that I  cannot recognize the name of. She also reports that a neighbor brought over moonshine and she had some of that. 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 She cannot handle the stressors of her children fighting with her and this brought on the cocaine use drinking and a suicide attempt.  Patient was seen by psychiatry and it was determined that patient meets criteria for inpatient hospitalization. There are currently no appropriate beds for patient at [facility]. They will be trying to place this patient.  9/9: Patient has undergone EGD today with noted erythema to the gastric mucosa but no erosions or ulcers.  She has been started on sucralfate by GI with recommendations for full liquid diet currently.  9/10: Patient is tolerating her diet which has been advanced to cardiac and she denies any further abdominal pain or other symptoms today.  She will remain on sucralfate as well as PPI as recommended by GI for now.  Okay for discharge to behavioral health facility today.  No other issues noted or acute events during this hospitalization.  Discharge Diagnoses:  Active Problems:   Suicidal ideation   Abdominal pain  Principal discharge diagnosis: Suicidal ideation with intentional poisoning using bleach/vinegar.  Discharge Instructions  Discharge Instructions    Diet - low sodium heart healthy   Complete by: As directed    Increase activity slowly   Complete by: As directed      Allergies as of 12/26/2018      Reactions   Clonopin [clonazepam] Anaphylaxis   Swelling of the tongue and mouth.    Fish Allergy Anaphylaxis,  Shortness Of Breath, Swelling   Flexeril [cyclobenzaprine Hcl] Shortness Of Breath   Ibuprofen Anaphylaxis, Hives   Shellfish Allergy Anaphylaxis   Ace Inhibitors Cough   Tramadol Nausea And Vomiting   Upset stomach      Medication List    STOP taking these medications   famotidine 20 MG tablet Commonly  known as: PEPCID   omeprazole 20 MG capsule Commonly known as: PRILOSEC     TAKE these medications   albuterol 108 (90 Base) MCG/ACT inhaler Commonly known as: VENTOLIN HFA Inhale 2 puffs into the lungs 2 (two) times daily.   albuterol (2.5 MG/3ML) 0.083% nebulizer solution Commonly known as: PROVENTIL Take 2.5 mg by nebulization every 6 (six) hours as needed for wheezing or shortness of breath.   amLODipine 5 MG tablet Commonly known as: NORVASC Take 5 mg by mouth daily.   aspirin EC 325 MG tablet Take 1 tablet (325 mg total) by mouth 2 (two) times daily.   benzonatate 200 MG capsule Commonly known as: TESSALON Take 1 capsule (200 mg total) by mouth 3 (three) times daily as needed for cough. Swallow whole, do not chew What changed: when to take this   cholecalciferol 25 MCG (1000 UT) tablet Commonly known as: VITAMIN D3 Take 2,000 Units by mouth daily.   citalopram 20 MG tablet Commonly known as: CELEXA Take 20 mg by mouth daily.   fluticasone 50 MCG/ACT nasal spray Commonly known as: FLONASE Place 1 spray into both nostrils daily.   gabapentin 300 MG capsule Commonly known as: NEURONTIN Take 1 capsule (300 mg total) by mouth 3 (three) times daily.   hydrochlorothiazide 12.5 MG capsule Commonly known as: MICROZIDE Take 12.5 mg by mouth daily.   loratadine 10 MG tablet Commonly known as: CLARITIN Take 10 mg by mouth daily.   methocarbamol 750 MG tablet Commonly known as: ROBAXIN Take 1 tablet (750 mg total) by mouth 3 (three) times daily as needed for muscle spasms.   mirtazapine 30 MG tablet Commonly known as: REMERON Take 30 mg by mouth at bedtime.   ondansetron 4 MG tablet Commonly known as: Zofran Take 1 tablet (4 mg total) by mouth every 8 (eight) hours as needed for nausea or vomiting.   oxyCODONE 5 MG immediate release tablet Commonly known as: Roxicodone Take 1 tablet (5 mg total) by mouth every 4 (four) hours as needed for severe pain.    pantoprazole 40 MG tablet Commonly known as: Protonix Take 1 tablet (40 mg total) by mouth daily.   potassium chloride SA 20 MEQ tablet Commonly known as: K-DUR Take 1 tablet (20 mEq total) by mouth 2 (two) times daily.   sennosides-docusate sodium 8.6-50 MG tablet Commonly known as: SENOKOT-S Take 2 tablets by mouth daily.   sucralfate 1 GM/10ML suspension Commonly known as: CARAFATE Take 10 mLs (1 g total) by mouth 4 (four) times daily -  with meals and at bedtime for 14 days.   traZODone 150 MG tablet Commonly known as: DESYREL Take 150-300 mg by mouth at bedtime as needed for sleep.      Follow-up Information    Jani Gravel, MD Follow up in 1 week(s).   Specialty: Internal Medicine Contact information: [place] [address] [phone]          Allergies  Allergen Reactions  . Clonopin [Clonazepam] Anaphylaxis    Swelling of the tongue and mouth.   . Fish Allergy Anaphylaxis, Shortness Of Breath and Swelling  .  Flexeril [Cyclobenzaprine Hcl] Shortness Of Breath  . Ibuprofen Anaphylaxis and Hives  . Shellfish Allergy Anaphylaxis  . Ace Inhibitors Cough  . Tramadol Nausea And Vomiting    Upset stomach    Consultations:  GI  TTS   Procedures/Studies: Ct Chest W Contrast  Result Date: 12/24/2018 CLINICAL DATA:  Ingestion bleach.  Epigastric pain. EXAM: CT CHEST, ABDOMEN, AND PELVIS WITH CONTRAST TECHNIQUE: Multidetector CT imaging of the chest, abdomen and pelvis was performed following the standard protocol during bolus administration of intravenous contrast. CONTRAST:  154mL OMNIPAQUE IOHEXOL 300 MG/ML  SOLN COMPARISON:  CT abdomen and pelvis 03/21/2018.  CTA chest 05/10/2018 FINDINGS: CT CHEST FINDINGS Cardiovascular: Heart is normal size. Aorta is normal caliber. Mediastinum/Nodes: No mediastinal, hilar, or axillary adenopathy. Trachea and esophagus are unremarkable. Thyroid unremarkable. Lungs/Pleura: Lungs are clear. No  focal airspace opacities or suspicious nodules. No effusions. Musculoskeletal: Chest wall soft tissues are unremarkable. No acute bony abnormality. CT ABDOMEN PELVIS FINDINGS Hepatobiliary: Diffuse low-density throughout the liver compatible with fatty infiltration. No focal abnormality. Gallbladder unremarkable. Pancreas: No focal abnormality or ductal dilatation. Spleen: No focal abnormality.  Normal size. Adrenals/Urinary Tract: No adrenal abnormality. No focal renal abnormality. No stones or hydronephrosis. Urinary bladder is unremarkable. Stomach/Bowel: Stomach is unremarkable. Large and small bowel decompressed, unremarkable. No evidence of perforation. Appendix normal. Vascular/Lymphatic: No evidence of aneurysm or adenopathy. Reproductive: Uterus and adnexa unremarkable.  No mass. Other: No free fluid or free air. Musculoskeletal: No acute bony abnormality. Degenerative changes in the lumbar spine. IMPRESSION: No acute findings in the chest, abdomen or pelvis. Fatty infiltration of the liver. Electronically Signed   By: Rolm Baptise M.D.   On: 12/24/2018 19:21   Ct Abdomen Pelvis W Contrast  Result Date: 12/24/2018 CLINICAL DATA:  Ingestion bleach.  Epigastric pain. EXAM: CT CHEST, ABDOMEN, AND PELVIS WITH CONTRAST TECHNIQUE: Multidetector CT imaging of the chest, abdomen and pelvis was performed following the standard protocol during bolus administration of intravenous contrast. CONTRAST:  188mL OMNIPAQUE IOHEXOL 300 MG/ML  SOLN COMPARISON:  CT abdomen and pelvis 03/21/2018.  CTA chest 05/10/2018 FINDINGS: CT CHEST FINDINGS Cardiovascular: Heart is normal size. Aorta is normal caliber. Mediastinum/Nodes: No mediastinal, hilar, or axillary adenopathy. Trachea and esophagus are unremarkable. Thyroid unremarkable. Lungs/Pleura: Lungs are clear. No focal airspace opacities or suspicious nodules. No effusions. Musculoskeletal: Chest wall soft tissues are unremarkable. No acute bony abnormality. CT ABDOMEN  PELVIS FINDINGS Hepatobiliary: Diffuse low-density throughout the liver compatible with fatty infiltration. No focal abnormality. Gallbladder unremarkable. Pancreas: No focal abnormality or ductal dilatation. Spleen: No focal abnormality.  Normal size. Adrenals/Urinary Tract: No adrenal abnormality. No focal renal abnormality. No stones or hydronephrosis. Urinary bladder is unremarkable. Stomach/Bowel: Stomach is unremarkable. Large and small bowel decompressed, unremarkable. No evidence of perforation. Appendix normal. Vascular/Lymphatic: No evidence of aneurysm or adenopathy. Reproductive: Uterus and adnexa unremarkable.  No mass. Other: No free fluid or free air. Musculoskeletal: No acute bony abnormality. Degenerative changes in the lumbar spine. IMPRESSION: No acute findings in the chest, abdomen or pelvis. Fatty infiltration of the liver. Electronically Signed   By: Rolm Baptise M.D.   On: 12/24/2018 19:21     Discharge Exam: Vitals:   12/26/18 0502 12/26/18 0801  BP: 110/74   Pulse: 71   Resp: 16   Temp: 98.5 F (36.9 C)   SpO2: 100% 98%   Vitals:   12/25/18 1904 12/25/18 2044 12/26/18 0502 12/26/18 0801  BP:  (!) 109/59 110/74   Pulse:  83 71  Resp:  16 16   Temp:  98.7 F (37.1 C) 98.5 F (36.9 C)   TempSrc:  Oral Oral   SpO2: 99% 98% 100% 98%  Weight:      Height:        General: Pt is alert, awake, not in acute distress Cardiovascular: RRR, S1/S2 +, no rubs, no gallops Respiratory: CTA bilaterally, no wheezing, no rhonchi Abdominal: Soft, NT, ND, bowel sounds + Extremities: no edema, no cyanosis    The results of significant diagnostics from this hospitalization (including imaging, microbiology, ancillary and laboratory) are listed below for reference.     Microbiology: Recent Results (from the past 240 hour(s))  SARS Coronavirus 2 [facility] order, Performed in [facility] hospital lab) Nasopharyngeal Nasopharyngeal Swab     Status: None   Collection Time:  12/24/18  7:46 PM   Specimen: Nasopharyngeal Swab  Result Value Ref Range Status   SARS Coronavirus 2 NEGATIVE NEGATIVE Final    Comment: (NOTE) If result is NEGATIVE SARS-CoV-2 target nucleic acids are NOT DETECTED. The SARS-CoV-2 RNA is generally detectable in upper and lower  respiratory specimens during the acute phase of infection. The lowest  concentration of SARS-CoV-2 viral copies this assay can detect is 250  copies / mL. A negative result does not preclude SARS-CoV-2 infection  and should not be used as the sole basis for treatment or other  patient management decisions.  A negative result may occur with  improper specimen collection / handling, submission of specimen other  than nasopharyngeal swab, presence of viral mutation(s) within the  areas targeted by this assay, and inadequate number of viral copies  (<250 copies / mL). A negative result must be combined with clinical  observations, patient history, and epidemiological information. If result is POSITIVE SARS-CoV-2 target nucleic acids are DETECTED. The SARS-CoV-2 RNA is generally detectable in upper and lower  respiratory specimens dur ing the acute phase of infection.  Positive  results are indicative of active infection with SARS-CoV-2.  Clinical  correlation with patient history and other diagnostic information is  necessary to determine patient infection status.  Positive results do  not rule out bacterial infection or co-infection with other viruses. If result is PRESUMPTIVE POSTIVE SARS-CoV-2 nucleic acids MAY BE PRESENT.   A presumptive positive result was obtained on the submitted specimen  and confirmed on repeat testing.  While 2019 novel coronavirus  (SARS-CoV-2) nucleic acids may be present in the submitted sample  additional confirmatory testing may be necessary for epidemiological  and / or clinical management purposes  to differentiate between  SARS-CoV-2 and other Sarbecovirus currently known to  infect humans.  If clinically indicated additional testing with an alternate test  methodology [phone]) is advised. The SARS-CoV-2 RNA is generally  detectable in upper and lower respiratory sp ecimens during the acute  phase of infection. The expected result is Negative. Fact Sheet for Patients:  StrictlyIdeas.no Fact Sheet for Healthcare Providers: BankingDealers.co.za This test is not yet approved or cleared by the [place] FDA and has been authorized for detection and/or diagnosis of SARS-CoV-2 by FDA under an Emergency Use Authorization (EUA).  This EUA will remain in effect (meaning this test can be used) for the duration of the COVID-19 declaration under Section 564(b)(1) of the Act, 21 U.S.C. section 360bbb-3(b)(1), unless the authorization is terminated or revoked sooner. Performed at [facility] [address], [place], [place] [address]      Labs: BNP (last 3 results) Recent Labs  03/27/18 1422 03/31/18 0638 05/10/18 1109  BNP 15.0 13.0 0000000   Basic Metabolic Panel: Recent Labs  Lab 12/24/18 1500 12/25/18 0529 12/26/18 0551  NA 134* 135 138  K 3.1* 3.4* 3.6  CL 100 104 107  CO2 19* 24 24  GLUCOSE 84 84 90  BUN 6 7 <5*  CREATININE 0.75 0.78 0.73  CALCIUM 8.9 8.1* 8.2*  MG  --   --  2.0   Liver Function Tests: Recent Labs  Lab 12/24/18 1500 12/25/18 0529  AST 39 26  ALT 38 28  ALKPHOS 74 62  BILITOT 0.7 0.7  PROT 7.7 6.4*  ALBUMIN 3.9 3.4*   Recent Labs  Lab 12/24/18 1500  LIPASE 28   No results for input(s): AMMONIA in the last 168 hours. CBC: Recent Labs  Lab 12/24/18 1500 12/25/18 0529 12/26/18 0551  WBC 8.0 6.1 5.4  NEUTROABS 4.2  --  2.5  HGB 12.1 11.2* 10.6*  HCT 36.5 34.6* 34.1*  MCV 87.1 90.1 92.4  PLT 592* 502* 491*   Cardiac Enzymes: No results for input(s): CKTOTAL, CKMB, CKMBINDEX, TROPONINI in the last 168 hours. BNP: Invalid input(s): POCBNP CBG: No results  for input(s): GLUCAP in the last 168 hours. D-Dimer No results for input(s): DDIMER in the last 72 hours. Hgb A1c No results for input(s): HGBA1C in the last 72 hours. Lipid Profile No results for input(s): CHOL, HDL, LDLCALC, TRIG, CHOLHDL, LDLDIRECT in the last 72 hours. Thyroid function studies Recent Labs    12/24/18 1500  TSH 1.533   Anemia work up Recent Labs    12/26/18 0551  VITAMINB12 468  FERRITIN 52  TIBC 374  IRON 45   Urinalysis    Component Value Date/Time   COLORURINE YELLOW 12/25/2018 1045   APPEARANCEUR HAZY (A) 12/25/2018 1045   LABSPEC >1.046 (H) 12/25/2018 1045   PHURINE 5.0 12/25/2018 1045   GLUCOSEU NEGATIVE 12/25/2018 1045   HGBUR NEGATIVE 12/25/2018 [place] 12/25/2018 1045   KETONESUR 20 (A) 12/25/2018 1045   PROTEINUR 30 (A) 12/25/2018 1045   UROBILINOGEN 4.0 (H) 08/05/2014 1415   NITRITE NEGATIVE 12/25/2018 1045   LEUKOCYTESUR TRACE (A) 12/25/2018 1045   Sepsis Labs Invalid input(s): PROCALCITONIN,  WBC,  LACTICIDVEN Microbiology Recent Results (from the past 240 hour(s))  SARS Coronavirus 2 [facility] order, Performed in [facility] hospital lab) Nasopharyngeal Nasopharyngeal Swab     Status: None   Collection Time: 12/24/18  7:46 PM   Specimen: Nasopharyngeal Swab  Result Value Ref Range Status   SARS Coronavirus 2 NEGATIVE NEGATIVE Final    Comment: (NOTE) If result is NEGATIVE SARS-CoV-2 target nucleic acids are NOT DETECTED. The SARS-CoV-2 RNA is generally detectable in upper and lower  respiratory specimens during the acute phase of infection. The lowest  concentration of SARS-CoV-2 viral copies this assay can detect is 250  copies / mL. A negative result does not preclude SARS-CoV-2 infection  and should not be used as the sole basis for treatment or other  patient management decisions.  A negative result may occur with  improper specimen collection / handling, submission of specimen other  than  nasopharyngeal swab, presence of viral mutation(s) within the  areas targeted by this assay, and inadequate number of viral copies  (<250 copies / mL). A negative result must be combined with clinical  observations, patient history, and epidemiological information. If result is POSITIVE SARS-CoV-2 target nucleic acids are DETECTED. The SARS-CoV-2 RNA is generally detectable in upper and  lower  respiratory specimens dur ing the acute phase of infection.  Positive  results are indicative of active infection with SARS-CoV-2.  Clinical  correlation with patient history and other diagnostic information is  necessary to determine patient infection status.  Positive results do  not rule out bacterial infection or co-infection with other viruses. If result is PRESUMPTIVE POSTIVE SARS-CoV-2 nucleic acids MAY BE PRESENT.   A presumptive positive result was obtained on the submitted specimen  and confirmed on repeat testing.  While 2019 novel coronavirus  (SARS-CoV-2) nucleic acids may be present in the submitted sample  additional confirmatory testing may be necessary for epidemiological  and / or clinical management purposes  to differentiate between  SARS-CoV-2 and other Sarbecovirus currently known to infect humans.  If clinically indicated additional testing with an alternate test  methodology [phone]) is advised. The SARS-CoV-2 RNA is generally  detectable in upper and lower respiratory sp ecimens during the acute  phase of infection. The expected result is Negative. Fact Sheet for Patients:  StrictlyIdeas.no Fact Sheet for Healthcare Providers: BankingDealers.co.za This test is not yet approved or cleared by the [place] FDA and has been authorized for detection and/or diagnosis of SARS-CoV-2 by FDA under an Emergency Use Authorization (EUA).  This EUA will remain in effect (meaning this test can be used) for the duration of  the COVID-19 declaration under Section 564(b)(1) of the Act, 21 U.S.C. section 360bbb-3(b)(1), unless the authorization is terminated or revoked sooner. Performed at [facility], [address], [place], [place] [address]      Time coordinating discharge: 35 minutes  SIGNED:   Rodena Goldmann, DO [facility] 12/26/2018, 10:07 AM  If 7PM-7AM, please contact night-coverage [email or web address] Password [email or web address]

## 2018-12-26 NOTE — Care Management (Addendum)
Left message with TTS disposition that patient is medically cleared.

## 2018-12-26 NOTE — Addendum Note (Signed)
Addendum  created 12/26/18 [id number] by Mickel Baas, CRNA   Charge Capture section accepted

## 2018-12-26 NOTE — Progress Notes (Signed)
Patient stable for transfer to behavioral health facility from medical standpoint.

## 2018-12-27 NOTE — BH Assessment (Signed)
Reassessment note: Pt presents lying on her back in hospital bed with little movement. She speaks softly with blunted affect. Pt states she feels a little better since yesterday but continues to endorse SI intermittently. Pt denies current AVH, but states earlier today she heard voice telling her to jump out the window. Pt denies HI. Inpt psychiatric care recommended.

## 2018-12-27 NOTE — BH Assessment (Signed)
Multiple attempts at TTS telepsych. Machine not answered. Please advise when machine is placed in pt's room.

## 2018-12-27 NOTE — Progress Notes (Signed)
This patient continues to meet inpatient criteria. CSW faxed information to the following facilities:   [facility] [place] Worker

## 2018-12-27 NOTE — Progress Notes (Signed)
At 22:52 I spoke with Silverio Lay at [facility].  Questions answered.  PC has closed her case.

## 2018-12-27 NOTE — Care Management (Signed)
  Accepted to [facility]  Dr. Elaina Hoops  [phone] is the number to give report  She is abe to come today.   Writer informed the RN working with the patient

## 2018-12-27 NOTE — Care Management (Signed)
[facility] is reviewing the patient

## 2018-12-27 NOTE — Progress Notes (Signed)
PT DISCHARGED TO [facility], IV REMOVED, VS STAFF, DENIES PAIN, NO ANXIETY NOTED. PT LEFT FLOOR VIA WHEELCHAIR ACCOMPANIED BY STAFF FOR TRANSFER FROM ED TO FACILITY.

## 2018-12-27 NOTE — Progress Notes (Signed)
Patient seen and evaluated at bedside this morning.  She is noted to have some very mild nausea, but otherwise is able to tolerate her diet with minimal symptoms.  Please refer to discharge summary dictated 9/10 for full details.  TTS reevaluation currently pending, but patient is stable for transfer from a medical perspective.  Total care time: 15 minutes.

## 2019-01-16 ENCOUNTER — Emergency Department (HOSPITAL_COMMUNITY)
Admission: EM | Admit: 2019-01-16 | Discharge: 2019-01-16 | Disposition: A | Discharge status: Home / Self Care | Payer: Medicaid Other | Attending: Emergency Medicine | Admitting: Emergency Medicine

## 2019-01-16 ENCOUNTER — Emergency Department (HOSPITAL_COMMUNITY): Payer: Medicaid Other

## 2019-01-16 ENCOUNTER — Encounter (HOSPITAL_COMMUNITY): Payer: Self-pay | Admitting: Emergency Medicine

## 2019-01-16 ENCOUNTER — Other Ambulatory Visit: Payer: Self-pay

## 2019-01-16 DIAGNOSIS — Z79899 Other long term (current) drug therapy: Secondary | ICD-10-CM | POA: Diagnosis not present

## 2019-01-16 DIAGNOSIS — Z87891 Personal history of nicotine dependence: Secondary | ICD-10-CM | POA: Diagnosis not present

## 2019-01-16 DIAGNOSIS — J449 Chronic obstructive pulmonary disease, unspecified: Secondary | ICD-10-CM | POA: Diagnosis not present

## 2019-01-16 DIAGNOSIS — I10 Essential (primary) hypertension: Secondary | ICD-10-CM | POA: Insufficient documentation

## 2019-01-16 DIAGNOSIS — M25561 Pain in right knee: Secondary | ICD-10-CM | POA: Insufficient documentation

## 2019-01-16 DIAGNOSIS — R52 Pain, unspecified: Secondary | ICD-10-CM | POA: Diagnosis not present

## 2019-01-16 DIAGNOSIS — R5381 Other malaise: Secondary | ICD-10-CM | POA: Diagnosis not present

## 2019-01-16 MED ORDER — FENTANYL CITRATE (PF) 100 MCG/2ML IJ SOLN
50.0000 ug | Freq: Once | INTRAMUSCULAR | Status: AC
  Administered 2019-01-16: 50 ug via INTRAMUSCULAR
  Filled 2019-01-16: qty 2

## 2019-01-16 MED ORDER — OXYCODONE-ACETAMINOPHEN 5-325 MG PO TABS
1.0000 | ORAL_TABLET | Freq: Once | ORAL | Status: AC
  Administered 2019-01-16: 13:00:00 1 via ORAL
  Filled 2019-01-16: qty 1

## 2019-01-16 MED ORDER — OXYCODONE-ACETAMINOPHEN 5-325 MG PO TABS: 1.0000 | ORAL_TABLET | Freq: Three times a day (TID) | ORAL | 0 refills | Status: DC | PRN

## 2019-01-16 NOTE — Discharge Instructions (Addendum)
Prescription given for Norco. Take medication as directed and do not operate machinery, drive a car, or work while taking this medication as it can make you drowsy.   Please do not put any weight on your right knee until you can follow up with your orthopedic doctor.  Please follow up with your orthopedic doctor, Dr. Mardelle Matte for an appointment in 1 week.   Please return to the emergency department for any new or worsening symptoms.
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
## 2019-01-22 ENCOUNTER — Other Ambulatory Visit: Payer: Self-pay | Admitting: *Deleted

## 2019-01-22 DIAGNOSIS — Z20822 Contact with and (suspected) exposure to covid-19: Secondary | ICD-10-CM

## 2019-01-23 LAB — NOVEL CORONAVIRUS, NAA: SARS-CoV-2, NAA: NOT DETECTED

## 2019-02-12 ENCOUNTER — Other Ambulatory Visit: Payer: Self-pay | Admitting: *Deleted

## 2019-02-12 DIAGNOSIS — Z20822 Contact with and (suspected) exposure to covid-19: Secondary | ICD-10-CM

## 2019-02-12 DIAGNOSIS — Z20828 Contact with and (suspected) exposure to other viral communicable diseases: Secondary | ICD-10-CM | POA: Diagnosis not present

## 2019-02-13 LAB — NOVEL CORONAVIRUS, NAA: SARS-CoV-2, NAA: NOT DETECTED

## 2019-09-18 ENCOUNTER — Ambulatory Visit: Payer: Medicaid Other | Attending: Internal Medicine

## 2019-09-18 DIAGNOSIS — Z23 Encounter for immunization: Secondary | ICD-10-CM

## 2019-09-18 NOTE — Progress Notes (Signed)
   Covid-19 Vaccination Clinic  Name:  Kathryn [name]    MRN: [id number] DOB: 10-19-1970  09/18/2019  Kathryn [name] was observed post Covid-19 immunization for 15 minutes without incident. She was provided with Vaccine Information Sheet and instruction to access the V-Safe system.   Kathryn [name] was instructed to call 911 with any severe reactions post vaccine: Marland Kitchen Difficulty breathing  . Swelling of face and throat  . A fast heartbeat  . A bad rash all over body  . Dizziness and weakness   Immunizations Administered    Name Date Dose VIS Date Route   Moderna COVID-19 Vaccine 09/18/2019  9:04 AM 0.5 mL 03/2019 Intramuscular   Manufacturer: Moderna   Lot: [id number]   [id number]

## 2019-10-23 ENCOUNTER — Ambulatory Visit: Payer: Medicaid Other | Attending: Internal Medicine

## 2019-10-28 DIAGNOSIS — Z23 Encounter for immunization: Secondary | ICD-10-CM | POA: Diagnosis not present

## 2019-11-09 DIAGNOSIS — F209 Schizophrenia, unspecified: Secondary | ICD-10-CM | POA: Diagnosis not present

## 2019-11-10 DIAGNOSIS — F209 Schizophrenia, unspecified: Secondary | ICD-10-CM | POA: Diagnosis not present

## 2019-11-11 DIAGNOSIS — F209 Schizophrenia, unspecified: Secondary | ICD-10-CM | POA: Diagnosis not present

## 2019-11-12 ENCOUNTER — Other Ambulatory Visit: Payer: Self-pay

## 2019-11-12 ENCOUNTER — Emergency Department (HOSPITAL_COMMUNITY)
Admission: EM | Admit: 2019-11-12 | Discharge: 2019-11-12 | Discharge status: Against Medical Advice | Payer: Medicaid Other

## 2019-11-13 ENCOUNTER — Encounter (HOSPITAL_COMMUNITY): Payer: Self-pay | Admitting: Emergency Medicine

## 2019-11-13 ENCOUNTER — Emergency Department (HOSPITAL_COMMUNITY)
Admission: EM | Admit: 2019-11-13 | Discharge: 2019-11-13 | Disposition: A | Discharge status: Home / Self Care | Payer: Medicaid Other | Attending: Emergency Medicine | Admitting: Emergency Medicine

## 2019-11-13 DIAGNOSIS — Z5321 Procedure and treatment not carried out due to patient leaving prior to being seen by health care provider: Secondary | ICD-10-CM | POA: Diagnosis not present

## 2019-11-13 DIAGNOSIS — M25552 Pain in left hip: Secondary | ICD-10-CM | POA: Insufficient documentation

## 2019-11-13 NOTE — ED Triage Notes (Signed)
Patient states she is hurting all over.  States throat is sore and it's hard to swallow.  Soreness started last Friday.

## 2019-11-20 DIAGNOSIS — F4310 Post-traumatic stress disorder, unspecified: Secondary | ICD-10-CM | POA: Diagnosis not present

## 2019-11-21 DIAGNOSIS — F419 Anxiety disorder, unspecified: Secondary | ICD-10-CM | POA: Diagnosis not present

## 2019-11-24 DIAGNOSIS — F419 Anxiety disorder, unspecified: Secondary | ICD-10-CM | POA: Diagnosis not present

## 2019-12-12 DIAGNOSIS — M545 Low back pain: Secondary | ICD-10-CM | POA: Diagnosis not present

## 2019-12-12 DIAGNOSIS — M170 Bilateral primary osteoarthritis of knee: Secondary | ICD-10-CM | POA: Diagnosis not present

## 2019-12-17 ENCOUNTER — Encounter: Payer: Self-pay | Admitting: Neurology

## 2019-12-17 ENCOUNTER — Other Ambulatory Visit: Payer: Self-pay

## 2019-12-17 ENCOUNTER — Ambulatory Visit: Payer: Medicaid Other | Admitting: Neurology

## 2019-12-17 VITALS — BP 147/91 | HR 79 | Ht 59.0 in | Wt 172.0 lb

## 2019-12-17 DIAGNOSIS — M545 Low back pain: Secondary | ICD-10-CM | POA: Diagnosis not present

## 2019-12-17 DIAGNOSIS — R002 Palpitations: Secondary | ICD-10-CM | POA: Diagnosis not present

## 2019-12-17 DIAGNOSIS — R0683 Snoring: Secondary | ICD-10-CM | POA: Insufficient documentation

## 2019-12-17 DIAGNOSIS — T17308A Unspecified foreign body in larynx causing other injury, initial encounter: Secondary | ICD-10-CM

## 2019-12-17 DIAGNOSIS — F515 Nightmare disorder: Secondary | ICD-10-CM

## 2019-12-17 DIAGNOSIS — G4719 Other hypersomnia: Secondary | ICD-10-CM | POA: Insufficient documentation

## 2019-12-17 DIAGNOSIS — G4750 Parasomnia, unspecified: Secondary | ICD-10-CM

## 2019-12-17 NOTE — Progress Notes (Signed)
SLEEP MEDICINE CLINIC    Provider:  Larey Seat, MD  Primary Care Physician:  Jani Gravel, MD [place] [address]     Referring Provider: Jani Gravel, [facility] [address] [place],  [place] [address]          Chief Complaint according to patient   Patient presents with:    . New Patient (Initial Visit)     patient has a long history of psychiatric disorders- and falls asleep easily,  but husband is  concerned about snoring and sleepiness, excessive.       HISTORY OF PRESENT ILLNESS:  Kathryn [name] is a 49 y.o. Black or African American female patient and seen here in a Sleep medicine consultation  quested by Dr. Maudie Mercury, visit date is  12/17/2019   Chief concern according to patient :  " I can strangled in my sleep"   I have the pleasure of seeing Kathryn [name] today, a right -handed Black or [place] American female who  has a past medical history of Anxiety, Bipolar disorder, Schizophrenia, Arthritis, Asthma, Chronic back pain, COPD (chronic obstructive pulmonary disease) ([facility]), Depression, GERD (gastroesophageal reflux disease), Hypertension, Tobacco abuse, and Osteoarthritis of left knee, patellofemoral (12/27/2017).    Sleep relevant medical history: Nocturia 6-7 times , has been  Sleep walking, with recurrent night mares,  Night terrors other Parasomnia.  Family medical /sleep history: son is the  other family member with OSA, insomnia, and he is a sleep walkers.    Social history:  Patient is unemployed - seeking disability,  from laundry services, and lives in a household with spouse and a granddaughter, 74 years old. . Tobacco use- 2017 quit.   ETOH use 3/ monthor less ,  Caffeine intake in form of Tea ( 3/day) . Regular exercise in form of none- since knee surgery 2019.     Sleep habits are as follows: The patient's dinner time is between 6 PM.  The patient goes to bed at 9 PM and she is asleep promptly, continues to sleep for  1-2 hour intervals , wakes for many bathroom breaks, the first time at 12 AM.   The preferred sleep position is supine, with the support of 1 pillow.  Dreams are reportedly frequent/vivid/ and kicks and yells in her sleep. She wakes with palpitations. Marland Kitchen  5  AM is the usual rise time. The patient wakes up spontaneously.  She reports not feeling refreshed or restored in AM, with symptoms such as dry mouth , morning headaches , and residual fatigue. Naps are taken frequently, dozes off all the time.   lasting from 2-12 minutes and are more refreshing than nocturnal sleep. Irrestible urge to go to sleep.    Review of Systems: Out of a complete 14 system review, the patient complains of only the following symptoms, and all other reviewed systems are negative.:   The patient is asleep for much of our interview and examination. Choking, gasping, snoring, yelling, kicking, vivid dreams.  Fatigue, sleepiness , snoring, fragmented sleep by dreams and nocturia.    How likely are you to doze in the following situations: 0 = not likely, 1 = slight chance, 2 = moderate chance, 3 = high chance   Sitting and Reading? Watching Television? Sitting inactive in a public place (theater or meeting)? As a passenger in a car for an hour without a break? Lying down in the afternoon when circumstances permit?  Sitting and talking to someone? Sitting quietly after lunch without alcohol? In a car, while stopped for a few minutes in traffic?   Total = 24/ 24 points   FSS endorsed at 63/ 63 points.   Social History   Socioeconomic History  . Marital status: Single    Spouse name: Not on file  . Number of children: Not on file  . Years of education: Not on file  . Highest education level: Not on file  Occupational History  . Not on file  Tobacco Use  . Smoking status: Former Smoker    Types: Cigarettes    Quit date: 2016    Years since quitting: 5.6  . Smokeless tobacco: Never Used  . Tobacco comment:  wears nicotine patches  Vaping Use  . Vaping Use: Never used  Substance and Sexual Activity  . Alcohol use: Yes    Comment: occ  . Drug use: Not Currently    Types: Cocaine    Comment: crack  last used 2016  . Sexual activity: Yes    Birth control/protection: Surgical    Comment: tubal  Other Topics Concern  . Not on file  Social History Narrative  . Not on file   Social Determinants of Health   Financial Resource Strain:   . Difficulty of Paying Living Expenses: Not on file  Food Insecurity:   . Worried About Charity fundraiser in the Last Year: Not on file  . Ran Out of Food in the Last Year: Not on file  Transportation Needs:   . Lack of Transportation (Medical): Not on file  . Lack of Transportation (Non-Medical): Not on file  Physical Activity:   . Days of Exercise per Week: Not on file  . Minutes of Exercise per Session: Not on file  Stress:   . Feeling of Stress : Not on file  Social Connections:   . Frequency of Communication with Friends and Family: Not on file  . Frequency of Social Gatherings with Friends and Family: Not on file  . Attends Religious Services: Not on file  . Active Member of Clubs or Organizations: Not on file  . Attends Archivist Meetings: Not on file  . Marital Status: Not on file    Family History  Problem Relation Age of Onset  . Gout Paternal Grandfather   . Cirrhosis Paternal Grandfather   . Hypertension Paternal Grandmother   . Aneurysm Paternal Grandmother   . Cirrhosis Maternal Grandmother   . Cirrhosis Maternal Grandfather   . Cancer Father   . Cirrhosis Father   . Cirrhosis Mother   . Breast cancer Sister   . Hypertension Sister   . Bronchitis Daughter   . Bronchitis Daughter   . Asthma Son   . Bronchitis Son   . Migraines Neg Hx     Past Medical History:  Diagnosis Date  . Anxiety   . Arthritis   . Asthma   . Chronic back pain   . COPD (chronic obstructive pulmonary disease) ([facility])    Chronic  bronchitis  . Depression   . GERD (gastroesophageal reflux disease)   . Hypertension   . Neuropathy   . Osteoarthritis of left knee, 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    Past Surgical History:  Procedure Laterality Date  . ESOPHAGOGASTRODUODENOSCOPY (EGD) WITH PROPOFOL N/A 12/25/2018   Procedure: ESOPHAGOGASTRODUODENOSCOPY (EGD) WITH PROPOFOL;  Surgeon: Rogene Houston, MD;  Location: [facility];  Service: Endoscopy;  Laterality: N/A;  . PATELLA-FEMORAL ARTHROPLASTY Left  10/08/2018   Procedure: PATELLA-FEMORAL ARTHROPLASTY;  Surgeon: Marchia Bond, MD;  Location: [facility];  Service: Orthopedics;  Laterality: Left;  . TUBAL LIGATION       Current Outpatient Medications on File Prior to Visit  Medication Sig Dispense Refill  . albuterol (PROVENTIL HFA;VENTOLIN HFA) 108 (90 Base) MCG/ACT inhaler Inhale 2 puffs into the lungs 2 (two) times daily.    Marland Kitchen albuterol (PROVENTIL) (2.5 MG/3ML) 0.083% nebulizer solution Take 2.5 mg by nebulization every 6 (six) hours as needed for wheezing or shortness of breath.    Marland Kitchen amLODipine (NORVASC) 5 MG tablet Take 5 mg by mouth daily.    . budesonide-formoterol (SYMBICORT) 80-4.5 MCG/ACT inhaler Inhale 2 puffs into the lungs 2 (two) times daily.    . citalopram (CELEXA) 20 MG tablet Take 20 mg by mouth daily.    . cloNIDine (CATAPRES) 0.1 MG tablet Take 0.1 mg by mouth daily.    Marland Kitchen gabapentin (NEURONTIN) 300 MG capsule Take 1 capsule (300 mg total) by mouth 3 (three) times daily. 30 capsule 0  . pantoprazole (PROTONIX) 40 MG tablet Take 1 tablet (40 mg total) by mouth daily. 30 tablet 1  . tiZANidine (ZANAFLEX) 2 MG tablet Take 2 mg by mouth 2 (two) times daily as needed.    . torsemide (DEMADEX) 5 MG tablet Take 5 mg by mouth daily.     No current facility-administered medications on file prior to visit.    Allergies  Allergen Reactions  . Clonopin [Clonazepam] Anaphylaxis    Swelling of the tongue and mouth.   . Fish Allergy Anaphylaxis, Shortness Of Breath  and Swelling  . Flexeril [Cyclobenzaprine Hcl] Shortness Of Breath  . Ibuprofen Anaphylaxis and Hives  . Shellfish Allergy Anaphylaxis  . Ace Inhibitors Cough  . Tramadol Nausea And Vomiting    Upset stomach    Physical exam:  Today's Vitals   12/17/19 1038  BP: (!) 147/91  Pulse: 79  Weight: 172 lb (78 kg)  Height: 4\' 11"  (1.499 m)   Body mass index is 34.74 kg/m.   Wt Readings from Last 3 Encounters:  12/17/19 172 lb (78 kg)  11/13/19 180 lb (81.6 kg)  01/16/19 192 lb (87.1 kg)     Ht Readings from Last 3 Encounters:  12/17/19 4\' 11"  (1.499 m)  11/13/19 4\' 11"  (1.499 m)  01/16/19 4\' 11"  (1.499 m)      General: The patient is awake, alert and appears not in acute distress.  Head: Normocephalic, atraumatic. Neck is supple. Mallampati 3,  neck circumference: 15 inches . Nasal airflow barelypatent.  Retrognathia is high grade, scalloped tongue  seen.  Dental status:  Cardiovascular:  Regular rate and cardiac rhythm by pulse,  without distended neck veins. Respiratory: Lungs are clear to auscultation.  Skin:  Without evidence of ankle edema, or rash. Trunk: The patient's posture is erect.   Neurologic exam : The patient is not fully alert, oriented to place and time.   Memory subjective described as intact.  Attention span & concentration ability appears impaired.  Speech is non- fluent,  without  dysarthria, dysphonia or aphasia.  Mood and affect are inappropriate.   Cranial nerves: no loss of smell or taste reported - Fully vaccinated.  Pupils are equal and briskly reactive to light. Funduscopic exam deferred.  Extraocular movements in vertical and horizontal planes were intact and without nystagmus. No Diplopia. Visual fields by finger perimetry are intact. Hearing was intact to soft voice and finger rubbing.  Facial sensation intact to fine touch.  Facial motor strength is symmetric and tongue and uvula move midline.  Neck ROM : rotation, tilt and flexion  extension were normal for age and shoulder shrug was symmetrical.    Motor exam:  Symmetric bulk, tone and ROM.   Equal  tone with mild  Biceps ogwheeling, symmetric grip strength .   Sensory:  Fine touch, pinprick and vibration were normal in both upper extremities. Toes are numb- ankle sense of vibration is present. .  Proprioception tested in the upper extremities was normal.   Coordination: Rapid alternating movements in the fingers/hands were of normal speed.  The Finger-to-nose maneuver was intact without evidence of ataxia, dysmetria or tremor.   Gait and station: Patient could rise unassisted from a seated position, walked without assistive device.   Toe and heel walk were deferred.  Deep tendon reflexes: in the  upper and lower extremities are symmetric and intact.  Babinski response was deferred .     Consultation Visit requested by Dr. Jani Gravel.  After spending a total time of  45  minutes face to face and additional time for physical and neurologic examination, review of laboratory studies,  personal review of imaging studies, reports and results of other testing and review of referral information / records as far as provided in visit, I have established the following assessments:  1) Kathryn [name] is a certainly excessively sleepy individual as she has dozed off several times during our brief interview here.  Her husband has to wake her, she is also easily startled when awoken.  She presents with a neurologic nonfocal exam, but her sleep related anatomy exam shows a narrow upper airway with a high-grade Mallampati, functional mask macroglossia with scalloped tongue and, retrognathia high-grade, all of these are risk factors for obstructive sleep apnea.  In addition she is a former smoker and could certainly have long-term effects on her oxygen saturation at night as well as in daytime.  She is also treated for several medications some for her psychiatric underlying conditions others  for chronic pain.  She had a left knee surgery in June 2020, she is highly suspecting that she has sleep apnea basically because of waking up from sleep gasping for air or feeling choked.  In addition she is a restless sleeper and seems to enact her dreams and her husband has noted her to yell, kick or rubs.  She does have mild cogwheeling over both biceps it is bulbous looking for REM sleep behavior disorder or other parasomnias.  The patient is on amlodipine, gabapentin, clonidine, torsemide, tizanidine, Amongero air inhaler, albuterol inhaler, Celebrex, and Nuplazid these are not many medications that can add to daytime sleepiness and fatigue.     My Plan is to proceed with:  1) OSA and parasomnia evaluation- full EEG montage PSG needed.   2) avoiding supine sleep.   3) nocturnal palpitations , nightmares, REM behavior? Denies PTSD.  Psychiatrist @ [facility] psychiatry will be asked to help reduce sedating agents.   I would like to thank Jani Gravel, MD and Jani Gravel, [place] [address] [place],  [place] [address] for allowing me to meet with and to take care of this pleasant patient.    I plan to follow up either personally or through our NP within 2-3  month.     Electronically signed by: Larey Seat, MD 12/17/2019 11:06 AM  [facility] Board certified by The [employer]  of Sleep Medicine and Diplomate of the [place] Academy of Sleep Medicine. Board certified In Neurology through the [place], Fellow of the [employer] of Neurology. Medical Director of [facility].

## 2019-12-17 NOTE — Patient Instructions (Signed)

## 2019-12-18 DIAGNOSIS — M545 Low back pain: Secondary | ICD-10-CM | POA: Diagnosis not present

## 2019-12-19 DIAGNOSIS — M545 Low back pain: Secondary | ICD-10-CM | POA: Diagnosis not present

## 2019-12-20 DIAGNOSIS — M545 Low back pain: Secondary | ICD-10-CM | POA: Diagnosis not present

## 2019-12-21 DIAGNOSIS — M545 Low back pain: Secondary | ICD-10-CM | POA: Diagnosis not present

## 2019-12-22 DIAGNOSIS — M545 Low back pain: Secondary | ICD-10-CM | POA: Diagnosis not present

## 2019-12-23 ENCOUNTER — Telehealth: Payer: Self-pay

## 2019-12-23 DIAGNOSIS — M545 Low back pain: Secondary | ICD-10-CM | POA: Diagnosis not present

## 2019-12-23 NOTE — Telephone Encounter (Signed)
Unable to leave message for pt to call me back to schedule sleep study that Dr. Brett Fairy has ordered. Pt's voicemail is not setup yet.

## 2019-12-24 DIAGNOSIS — M545 Low back pain: Secondary | ICD-10-CM | POA: Diagnosis not present

## 2019-12-25 DIAGNOSIS — M545 Low back pain: Secondary | ICD-10-CM | POA: Diagnosis not present

## 2019-12-26 DIAGNOSIS — M545 Low back pain: Secondary | ICD-10-CM | POA: Diagnosis not present

## 2019-12-27 DIAGNOSIS — M545 Low back pain: Secondary | ICD-10-CM | POA: Diagnosis not present

## 2019-12-28 ENCOUNTER — Ambulatory Visit (INDEPENDENT_AMBULATORY_CARE_PROVIDER_SITE_OTHER): Payer: Medicaid Other | Admitting: Neurology

## 2019-12-28 DIAGNOSIS — F515 Nightmare disorder: Secondary | ICD-10-CM

## 2019-12-28 DIAGNOSIS — G4719 Other hypersomnia: Secondary | ICD-10-CM

## 2019-12-28 DIAGNOSIS — G4750 Parasomnia, unspecified: Secondary | ICD-10-CM | POA: Diagnosis not present

## 2019-12-28 DIAGNOSIS — R0683 Snoring: Secondary | ICD-10-CM

## 2019-12-28 DIAGNOSIS — M545 Low back pain: Secondary | ICD-10-CM | POA: Diagnosis not present

## 2019-12-28 DIAGNOSIS — T17308A Unspecified foreign body in larynx causing other injury, initial encounter: Secondary | ICD-10-CM

## 2019-12-29 DIAGNOSIS — M545 Low back pain: Secondary | ICD-10-CM | POA: Diagnosis not present

## 2019-12-30 DIAGNOSIS — M545 Low back pain: Secondary | ICD-10-CM | POA: Diagnosis not present

## 2019-12-31 DIAGNOSIS — M545 Low back pain: Secondary | ICD-10-CM | POA: Diagnosis not present

## 2020-01-01 ENCOUNTER — Telehealth: Payer: Self-pay | Admitting: Neurology

## 2020-01-01 DIAGNOSIS — F339 Major depressive disorder, recurrent, unspecified: Secondary | ICD-10-CM | POA: Diagnosis not present

## 2020-01-01 DIAGNOSIS — E559 Vitamin D deficiency, unspecified: Secondary | ICD-10-CM | POA: Diagnosis not present

## 2020-01-01 DIAGNOSIS — G4700 Insomnia, unspecified: Secondary | ICD-10-CM | POA: Diagnosis not present

## 2020-01-01 DIAGNOSIS — M25562 Pain in left knee: Secondary | ICD-10-CM | POA: Diagnosis not present

## 2020-01-01 DIAGNOSIS — I10 Essential (primary) hypertension: Secondary | ICD-10-CM | POA: Diagnosis not present

## 2020-01-01 DIAGNOSIS — M545 Low back pain: Secondary | ICD-10-CM | POA: Diagnosis not present

## 2020-01-01 DIAGNOSIS — M797 Fibromyalgia: Secondary | ICD-10-CM | POA: Diagnosis not present

## 2020-01-01 DIAGNOSIS — M7918 Myalgia, other site: Secondary | ICD-10-CM | POA: Diagnosis not present

## 2020-01-01 DIAGNOSIS — F419 Anxiety disorder, unspecified: Secondary | ICD-10-CM | POA: Diagnosis not present

## 2020-01-01 NOTE — Telephone Encounter (Signed)
Pt called to update telephone number. Have updated in demographics.

## 2020-01-02 DIAGNOSIS — M545 Low back pain: Secondary | ICD-10-CM | POA: Diagnosis not present

## 2020-01-03 DIAGNOSIS — M545 Low back pain: Secondary | ICD-10-CM | POA: Diagnosis not present

## 2020-01-04 DIAGNOSIS — M545 Low back pain: Secondary | ICD-10-CM | POA: Diagnosis not present

## 2020-01-05 DIAGNOSIS — M545 Low back pain: Secondary | ICD-10-CM | POA: Diagnosis not present

## 2020-01-06 DIAGNOSIS — M545 Low back pain: Secondary | ICD-10-CM | POA: Diagnosis not present

## 2020-01-07 DIAGNOSIS — M545 Low back pain: Secondary | ICD-10-CM | POA: Diagnosis not present

## 2020-01-08 DIAGNOSIS — M545 Low back pain: Secondary | ICD-10-CM | POA: Diagnosis not present

## 2020-01-10 DIAGNOSIS — M545 Low back pain: Secondary | ICD-10-CM | POA: Diagnosis not present

## 2020-01-11 DIAGNOSIS — M545 Low back pain: Secondary | ICD-10-CM | POA: Diagnosis not present

## 2020-01-13 DIAGNOSIS — M545 Low back pain: Secondary | ICD-10-CM | POA: Diagnosis not present

## 2020-01-13 NOTE — Procedures (Signed)
PATIENT'S NAME:  Kathryn, [name] DOB:      Jun 26, 1970      MR#:    [id number]     DATE OF RECORDING: 12/28/2019 [facility] REFERRING M.D.:  Jani Gravel, MD Study Performed:  Polysomnogram with expanded EEG  HISTORY:  Kathryn [name] is a 49 y.o.  r African American female patient and seen here in a Sleep medicine consultation requested by Dr. Maudie Mercury, the visit date is 12/17/2019    Chief concern according to patient:  " I get strangled in my sleep".  I have the pleasure of seeing Kathryn [name] today, a right -handed African American female who has a medical history of Anxiety, Bipolar disorder, Schizophrenia, Arthritis, Asthma, Chronic back pain, COPD (chronic obstructive pulmonary disease) ([facility]), Depression, GERD (gastroesophageal reflux disease), Hypertension, Tobacco abuse, and Osteoarthritis of left knee, patellofemoral (12/27/2017).    Sleep relevant medical history: Nocturia 6-7 times, has been sleep walking, presents with recurrent night- mares, indicating Night terrors or other Parasomnia.    Sleep habits are as follows: The patient's dinner time is between 6 PM.  The patient goes to bed at 9 PM and she is asleep promptly, continues to sleep for 1-2 hour intervals, wakes for many bathroom breaks, the first time at 12 AM.   Dreams are reportedly frequent/vivid/ and kicks and yells in her sleep. She wakes with palpitations.  She reports an irresistible urge to go to sleep.   The patient endorsed the Epworth Sleepiness Scale at 24/24 points.   The patient's weight 172 pounds with a height of 59 (inches), resulting in a BMI of 34.7 kg/m2. The patient's neck circumference measured 15 inches.  CURRENT MEDICATIONS: Proventil, Norvasc, Symbicort, Celexa, Catapres, Neurontin, Protonix, Zanaflex, Demadex.   PROCEDURE:  This is a multichannel digital polysomnogram utilizing the Somnostar 11.2 system.  Electrodes and sensors were applied and monitored per AASM Specifications.   EEG, EOG, Chin and Limb EMG,  were sampled at 200 Hz.  ECG, Snore and Nasal Pressure, Thermal Airflow, Respiratory Effort, CPAP Flow and Pressure, Oximetry was sampled at 50 Hz. Digital video and audio were recorded.        BASELINE STUDY: Lights Out was at 21:03 and Lights On at 05:01.  Total recording time (TRT) was 478.5 minutes, with a total sleep time (TST) of 440 minutes.   The patient's sleep latency was 19 minutes.  REM latency was 116.5 minutes.  The sleep efficiency was 92.1 %.     SLEEP ARCHITECTURE: WASO (Wake after sleep onset) was 35.5 minutes.  There were 8.5 minutes in Stage N1, 303 minutes Stage N2, 27.5 minutes Stage N3 and 101 minutes in Stage REM.  The percentage of Stage N1 was 1.9%, Stage N2 was 68.9%, Stage N3 was 6.25% and Stage R (REM sleep) was 23.%.   RESPIRATORY ANALYSIS:  There were a total of 25 respiratory events:  5 obstructive apneas, 10 central apneas and 1 mixed apnea with a total of 16 apneas and 9 hypopneas. The patient also had several respiratory event related arousals (RERAs).     The total APNEA/HYPOPNEA INDEX (AHI) was 3.4/hour and the total RESPIRATORY DISTURBANCE INDEX was  4.4 /hour.  6 events occurred in REM sleep and 22 events in NREM. The REM AHI was 3.6 /hour, versus a non-REM AHI of 3.4. The patient spent 329.5 minutes of total sleep time in the supine position and 111 minutes in non-supine. The supine AHI was 4.2/h versus a non-supine AHI of 1.0.  OXYGEN  SATURATION & C02:  The Wake baseline 02 saturation was 96%, with the lowest being 76%. Time spent below 89% saturation equaled 4 minutes. The arousals were noted as: 30 were spontaneous, 0 were associated with PLMs, 4 were associated with respiratory events .PERIODIC LIMB MOVEMENTS:  The patient had a total of 0 Periodic Limb Movements.    Audio and video analysis did not show any abnormal or unusual movements, behaviors, but sleep talking was captured during REM sleep and NREM sleep, once leading to an arousal.  The patient took  two bathroom breaks. Loud Snoring was noted. EKG was in keeping with normal sinus rhythm (NSR).  IMPRESSION:  1. No significant degree of Sleep Apnea (SA) was noted, the mild apnea seen was central dominant.  2. No Periodic Limb Movement Disorder (PLMD) 3. Primary Snoring 4. Confused Arousal out of dream sleep related sleep talking.     RECOMMENDATIONS:  Parasomnia activity in form of sleep-talking, sleep arousals with confusion. NO PLMs in REM sleep were noted and no seizure activity seen.  Vivid dreams confirmed, these may lead to the choking sensation described.    I certify that I have reviewed the entire raw data recording prior to the issuance of this report in accordance with the Standards of Accreditation of the American Academy of Sleep Medicine (AASM)  Larey Seat, MD [facility], [facility]  Diplomat, American Board of Sleep Medicine Medical Director, [employer] [employer]

## 2020-01-13 NOTE — Progress Notes (Signed)
IMPRESSION:  1. No significant degree of Sleep Apnea (SA) was noted, the mild apnea seen was central dominant.  2. No Periodic Limb Movement Disorder (PLMD) 3. Primary Snoring 4. Confused Arousal out of dream sleep related sleep talking.     RECOMMENDATIONS:  Parasomnia activity in form of sleep-talking, sleep arousals with confusion. NO PLMs in REM sleep were noted and no seizure activity seen.  Vivid dreams confirmed, these may lead to the choking sensation described.

## 2020-01-14 ENCOUNTER — Telehealth: Payer: Self-pay | Admitting: Neurology

## 2020-01-14 DIAGNOSIS — M545 Low back pain: Secondary | ICD-10-CM | POA: Diagnosis not present

## 2020-01-14 NOTE — Telephone Encounter (Signed)
-----   Message from Larey Seat, MD sent at 01/13/2020  8:46 AM EDT ----- IMPRESSION:  1. No significant degree of Sleep Apnea (SA) was noted, the mild apnea seen was central dominant.  2. No Periodic Limb Movement Disorder (PLMD) 3. Primary Snoring 4. Confused Arousal out of dream sleep related sleep talking.     RECOMMENDATIONS:  Parasomnia activity in form of sleep-talking, sleep arousals with confusion. NO PLMs in REM sleep were noted and no seizure activity seen.  Vivid dreams confirmed, these may lead to the choking sensation described.

## 2020-01-14 NOTE — Telephone Encounter (Signed)
Called the patient back and reviewed the sleep study results with her. Advised her of the findings. Pt will come in tomorrow at 9:30 to discuss sleep results and if anything can be done or needs to be done for the parasomnia concerns.

## 2020-01-14 NOTE — Telephone Encounter (Signed)
Pt returned phone call.  

## 2020-01-14 NOTE — Telephone Encounter (Signed)
Called patient to discuss sleep study results. Unable to leave a voicemail due to no VM set up

## 2020-01-15 ENCOUNTER — Ambulatory Visit: Payer: Medicaid Other | Admitting: Neurology

## 2020-01-15 ENCOUNTER — Encounter: Payer: Self-pay | Admitting: Neurology

## 2020-01-15 DIAGNOSIS — M545 Low back pain: Secondary | ICD-10-CM | POA: Diagnosis not present

## 2020-01-16 DIAGNOSIS — M545 Low back pain, unspecified: Secondary | ICD-10-CM | POA: Diagnosis not present

## 2020-01-17 DIAGNOSIS — M545 Low back pain, unspecified: Secondary | ICD-10-CM | POA: Diagnosis not present

## 2020-01-18 DIAGNOSIS — M545 Low back pain, unspecified: Secondary | ICD-10-CM | POA: Diagnosis not present

## 2020-01-19 DIAGNOSIS — M545 Low back pain, unspecified: Secondary | ICD-10-CM | POA: Diagnosis not present

## 2020-01-20 DIAGNOSIS — M545 Low back pain, unspecified: Secondary | ICD-10-CM | POA: Diagnosis not present

## 2020-01-21 DIAGNOSIS — M545 Low back pain, unspecified: Secondary | ICD-10-CM | POA: Diagnosis not present

## 2020-01-21 IMAGING — CT CT ABD-PELV W/ CM
2 of 5 series · 16 of 46 positions shown, 18 images · IV contrast (Isovue)
Comparison: Abdominal ultrasound 03/21/2018, body CT 08/05/2014

CLINICAL DATA: Upper abdominal distension and pain.

EXAM:
CT ABDOMEN AND PELVIS WITH CONTRAST
TECHNIQUE: Multidetector CT imaging of the abdomen and pelvis was performed
using the standard protocol following bolus administration of
intravenous contrast.
CONTRAST:  100mL 1PZ2OF-5JJ IOPAMIDOL (1PZ2OF-5JJ) INJECTION 61%

[Series 2: axial st · axial · 0.82mm/px · z∈[+869,+1249]mm · 13 of 86 slices shown, 15 images]
[im 5/86  soft-tissue]
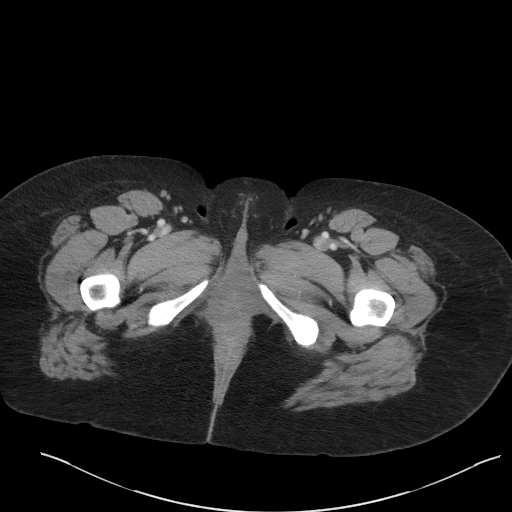
[im 5/86  bone]
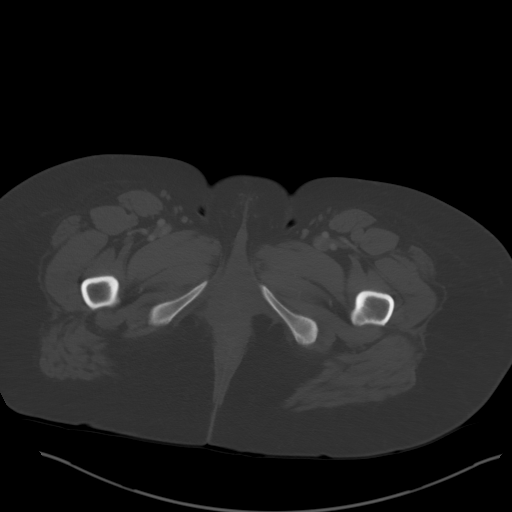
[im 10/86  soft-tissue]
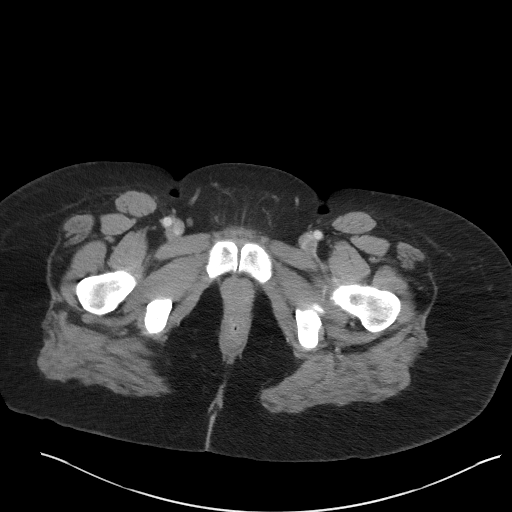
[im 19/86  soft-tissue]
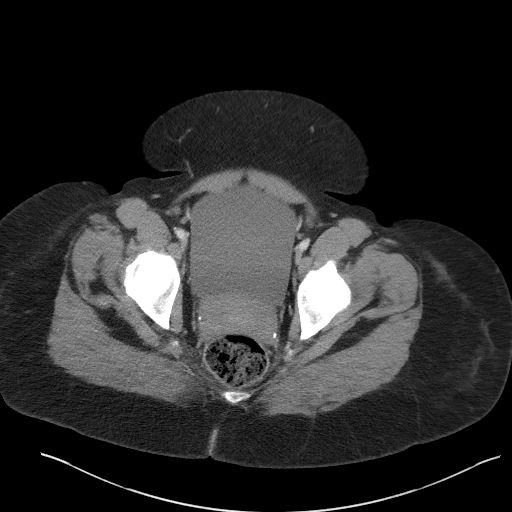
[im 24/86  soft-tissue]
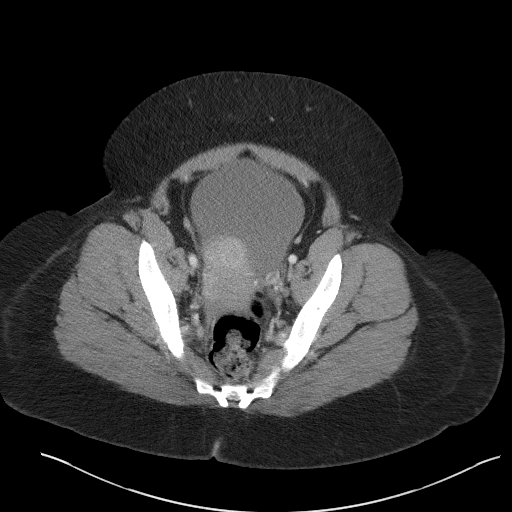
[im 29/86  soft-tissue]
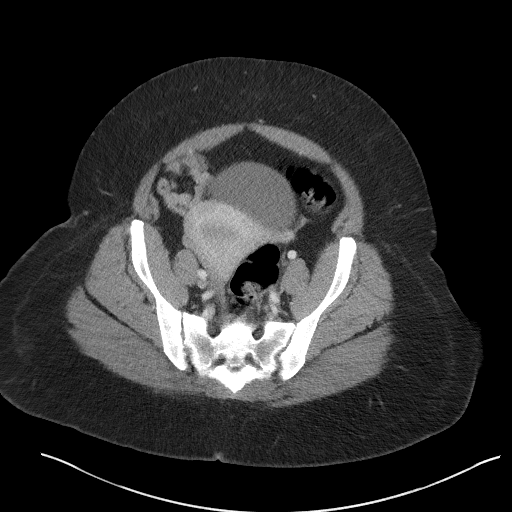
[im 38/86  soft-tissue]
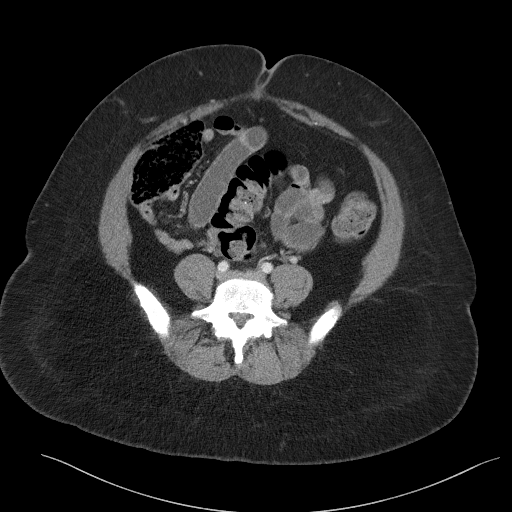
[im 43/86  soft-tissue]
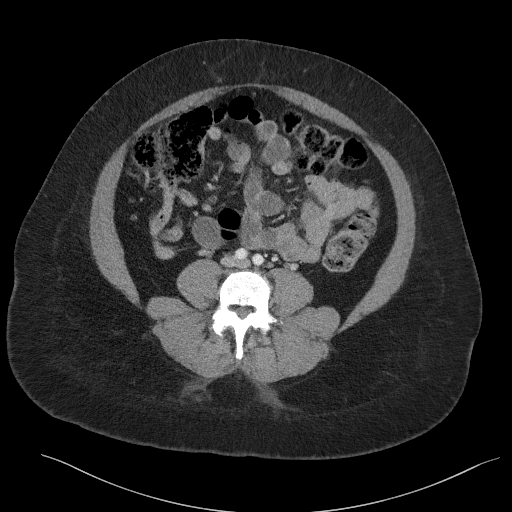
[im 48/86  soft-tissue]
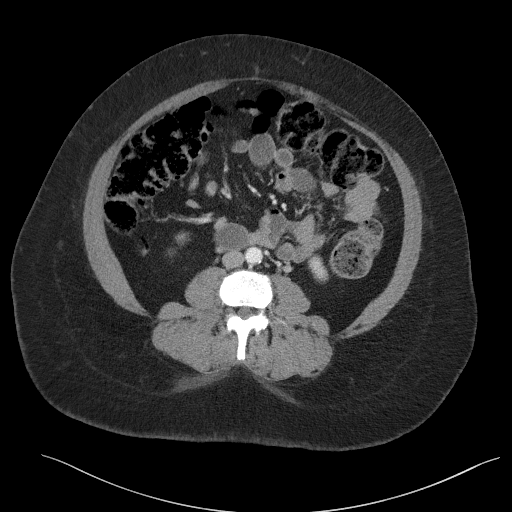
[im 57/86  soft-tissue]
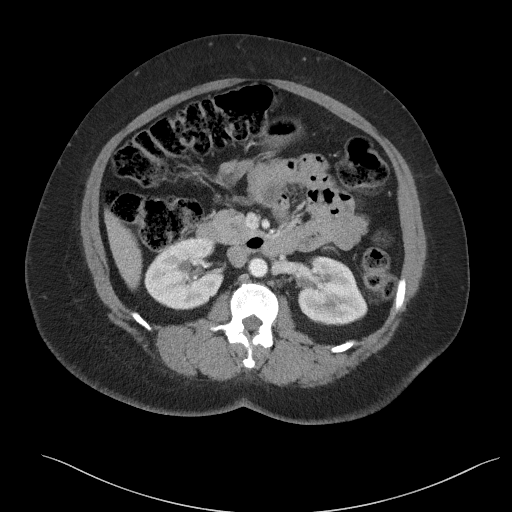
[im 57/86  bone]
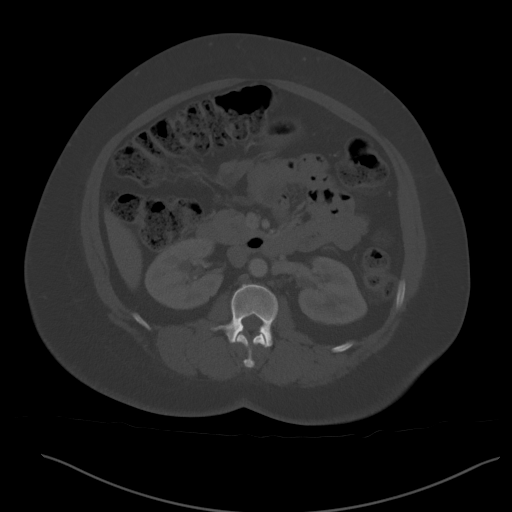
[im 62/86  soft-tissue]
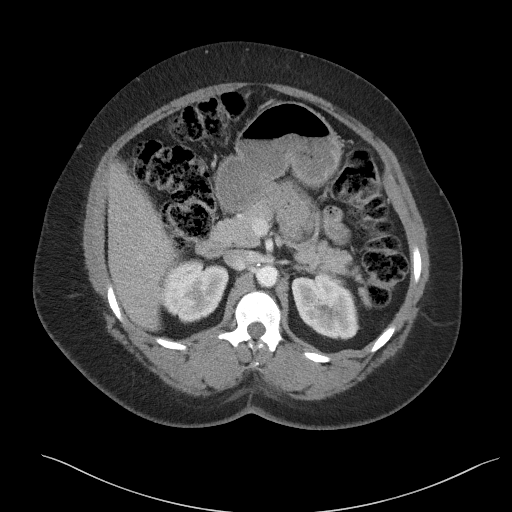
[im 67/86  soft-tissue]
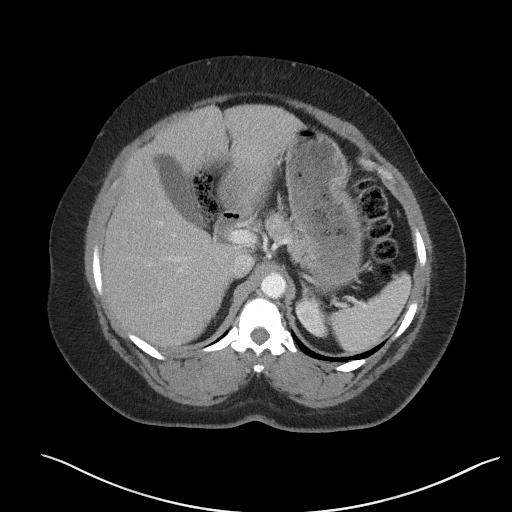
[im 76/86  soft-tissue]
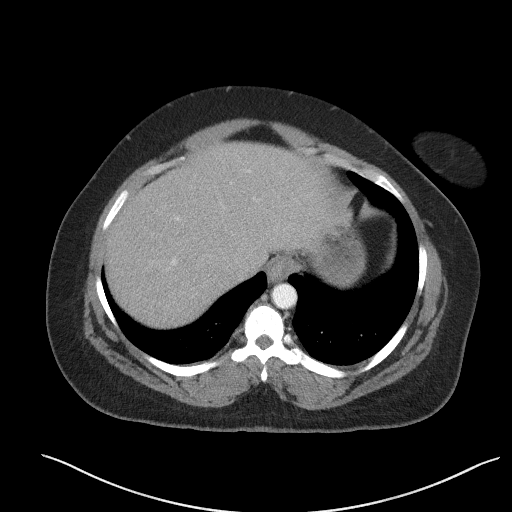
[im 81/86  soft-tissue]
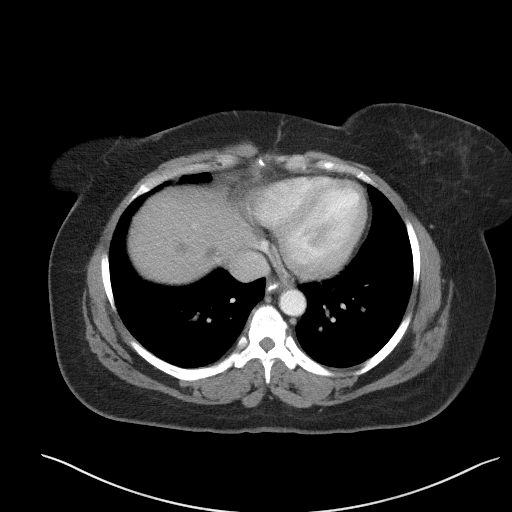

[Series 5: coronal st · coronal · 0.80mm/px · 3 of 120 slices shown]
[im 40/120  soft-tissue]
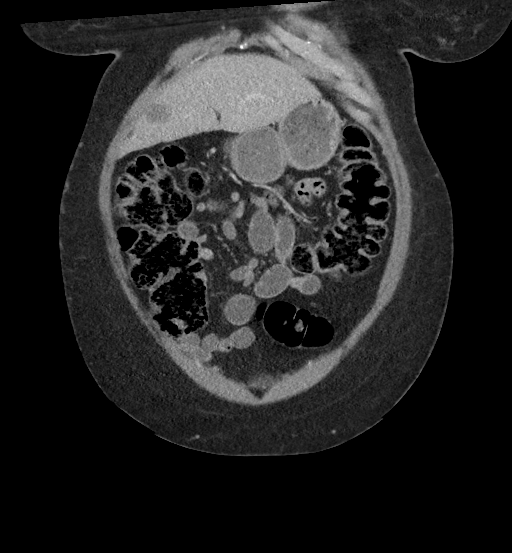
[im 53/120  soft-tissue]
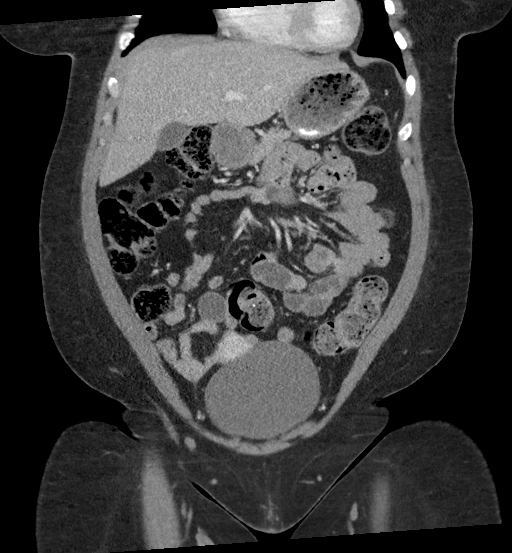
[im 67/120  soft-tissue]
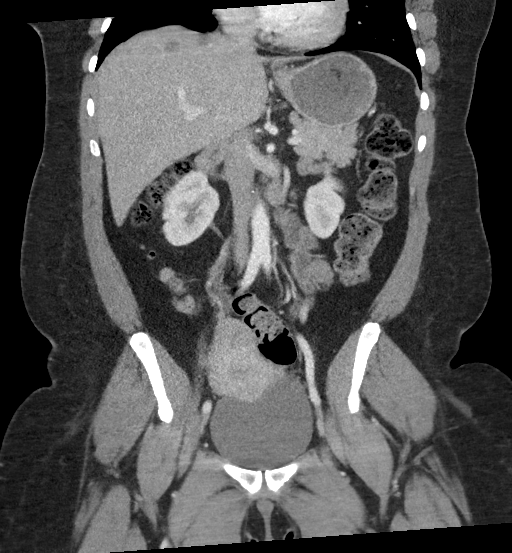

[16 of 46 positions shown; findings below may reference images not displayed]

FINDINGS: Lower chest: No acute abnormality.

Hepatobiliary: 2 hypoechoic masses with post thin peripheral
enhancement in the dome of the liver measure 11 and 12 mm. A third
subcapsular mass measures 2.0 cm. The gallbladder is normal.

Pancreas: Unremarkable. No pancreatic ductal dilatation or
surrounding inflammatory changes.

Spleen: Normal in size without focal abnormality.

Adrenals/Urinary Tract: Adrenal glands are unremarkable. Kidneys are
normal, without renal calculi, focal lesion, or hydronephrosis.
Bladder is unremarkable.

Stomach/Bowel: Stomach is within normal limits. Appendix appears
normal. No evidence of bowel wall thickening, distention, or
inflammatory changes. Nonspecific sub pathologic gaseous distension
of the colon.

Vascular/Lymphatic: No significant vascular findings are present. No
enlarged abdominal or pelvic lymph nodes.

Reproductive: Uterus and bilateral adnexa are unremarkable.

Other: No abdominal wall hernia or abnormality. No abdominopelvic
ascites.

Musculoskeletal: No acute or significant osseous findings.
IMPRESSION: No evidence of acute findings within the abdomen or pelvis.

Nonspecific sub pathologic gaseous distension of the colon may
account for patient's symptoms.

Three hypoattenuated liver masses, mostly stable accounting for
differences in technique from the prior noncontrasted CT dated
08/05/2014. In the absence of history of chronic liver disease or
malignancy capable of producing liver lesions, and given their
hyperechoic appearance on prior comparison abdominal ultrasound,
these likely represent hemangiomas. Follow-up with right upper
quadrant ultrasound in 3-6 months is recommended to assure
stability.

## 2020-01-22 DIAGNOSIS — M6281 Muscle weakness (generalized): Secondary | ICD-10-CM | POA: Diagnosis not present

## 2020-01-22 DIAGNOSIS — R2681 Unsteadiness on feet: Secondary | ICD-10-CM | POA: Diagnosis not present

## 2020-01-22 DIAGNOSIS — M5442 Lumbago with sciatica, left side: Secondary | ICD-10-CM | POA: Diagnosis not present

## 2020-01-22 DIAGNOSIS — M545 Low back pain, unspecified: Secondary | ICD-10-CM | POA: Diagnosis not present

## 2020-01-23 DIAGNOSIS — M545 Low back pain, unspecified: Secondary | ICD-10-CM | POA: Diagnosis not present

## 2020-01-24 DIAGNOSIS — M545 Low back pain, unspecified: Secondary | ICD-10-CM | POA: Diagnosis not present

## 2020-01-25 DIAGNOSIS — M545 Low back pain, unspecified: Secondary | ICD-10-CM | POA: Diagnosis not present

## 2020-01-26 DIAGNOSIS — M545 Low back pain, unspecified: Secondary | ICD-10-CM | POA: Diagnosis not present

## 2020-01-27 DIAGNOSIS — M545 Low back pain, unspecified: Secondary | ICD-10-CM | POA: Diagnosis not present

## 2020-01-28 DIAGNOSIS — M545 Low back pain, unspecified: Secondary | ICD-10-CM | POA: Diagnosis not present

## 2020-01-29 DIAGNOSIS — M545 Low back pain, unspecified: Secondary | ICD-10-CM | POA: Diagnosis not present

## 2020-01-30 DIAGNOSIS — M545 Low back pain, unspecified: Secondary | ICD-10-CM | POA: Diagnosis not present

## 2020-01-31 DIAGNOSIS — M545 Low back pain, unspecified: Secondary | ICD-10-CM | POA: Diagnosis not present

## 2020-02-01 DIAGNOSIS — M545 Low back pain, unspecified: Secondary | ICD-10-CM | POA: Diagnosis not present

## 2020-02-02 ENCOUNTER — Other Ambulatory Visit: Payer: Self-pay | Admitting: Orthopedic Surgery

## 2020-02-02 DIAGNOSIS — M545 Low back pain, unspecified: Secondary | ICD-10-CM

## 2020-02-03 DIAGNOSIS — M545 Low back pain, unspecified: Secondary | ICD-10-CM | POA: Diagnosis not present

## 2020-02-04 DIAGNOSIS — M545 Low back pain, unspecified: Secondary | ICD-10-CM | POA: Diagnosis not present

## 2020-02-05 DIAGNOSIS — M545 Low back pain, unspecified: Secondary | ICD-10-CM | POA: Diagnosis not present

## 2020-02-05 DIAGNOSIS — I10 Essential (primary) hypertension: Secondary | ICD-10-CM | POA: Diagnosis not present

## 2020-02-05 DIAGNOSIS — Z1159 Encounter for screening for other viral diseases: Secondary | ICD-10-CM | POA: Diagnosis not present

## 2020-02-05 DIAGNOSIS — R531 Weakness: Secondary | ICD-10-CM | POA: Diagnosis not present

## 2020-02-05 DIAGNOSIS — Z1152 Encounter for screening for COVID-19: Secondary | ICD-10-CM | POA: Diagnosis not present

## 2020-02-05 DIAGNOSIS — R059 Cough, unspecified: Secondary | ICD-10-CM | POA: Diagnosis not present

## 2020-02-06 DIAGNOSIS — M545 Low back pain, unspecified: Secondary | ICD-10-CM | POA: Diagnosis not present

## 2020-02-07 DIAGNOSIS — M545 Low back pain, unspecified: Secondary | ICD-10-CM | POA: Diagnosis not present

## 2020-02-08 DIAGNOSIS — M545 Low back pain, unspecified: Secondary | ICD-10-CM | POA: Diagnosis not present

## 2020-02-11 DIAGNOSIS — J4540 Moderate persistent asthma, uncomplicated: Secondary | ICD-10-CM | POA: Diagnosis not present

## 2020-02-11 DIAGNOSIS — I10 Essential (primary) hypertension: Secondary | ICD-10-CM | POA: Diagnosis not present

## 2020-02-11 DIAGNOSIS — B349 Viral infection, unspecified: Secondary | ICD-10-CM | POA: Diagnosis not present

## 2020-02-11 DIAGNOSIS — E559 Vitamin D deficiency, unspecified: Secondary | ICD-10-CM | POA: Diagnosis not present

## 2020-02-11 DIAGNOSIS — M545 Low back pain, unspecified: Secondary | ICD-10-CM | POA: Diagnosis not present

## 2020-02-11 DIAGNOSIS — Z1159 Encounter for screening for other viral diseases: Secondary | ICD-10-CM | POA: Diagnosis not present

## 2020-02-12 DIAGNOSIS — M545 Low back pain, unspecified: Secondary | ICD-10-CM | POA: Diagnosis not present

## 2020-02-13 DIAGNOSIS — M545 Low back pain, unspecified: Secondary | ICD-10-CM | POA: Diagnosis not present

## 2020-02-14 DIAGNOSIS — M545 Low back pain, unspecified: Secondary | ICD-10-CM | POA: Diagnosis not present

## 2020-02-15 DIAGNOSIS — M545 Low back pain, unspecified: Secondary | ICD-10-CM | POA: Diagnosis not present

## 2020-02-20 ENCOUNTER — Other Ambulatory Visit: Payer: Self-pay

## 2020-02-20 ENCOUNTER — Emergency Department (HOSPITAL_COMMUNITY): Payer: Medicaid Other

## 2020-02-20 ENCOUNTER — Encounter (HOSPITAL_COMMUNITY): Payer: Self-pay

## 2020-02-20 ENCOUNTER — Emergency Department (HOSPITAL_COMMUNITY)
Admission: EM | Admit: 2020-02-20 | Discharge: 2020-02-20 | Disposition: A | Discharge status: Home / Self Care | Payer: Medicaid Other | Attending: Emergency Medicine | Admitting: Emergency Medicine

## 2020-02-20 DIAGNOSIS — J449 Chronic obstructive pulmonary disease, unspecified: Secondary | ICD-10-CM | POA: Diagnosis not present

## 2020-02-20 DIAGNOSIS — M25462 Effusion, left knee: Secondary | ICD-10-CM | POA: Diagnosis not present

## 2020-02-20 DIAGNOSIS — Z96652 Presence of left artificial knee joint: Secondary | ICD-10-CM | POA: Insufficient documentation

## 2020-02-20 DIAGNOSIS — S8002XA Contusion of left knee, initial encounter: Secondary | ICD-10-CM | POA: Insufficient documentation

## 2020-02-20 DIAGNOSIS — W108XXA Fall (on) (from) other stairs and steps, initial encounter: Secondary | ICD-10-CM | POA: Diagnosis not present

## 2020-02-20 DIAGNOSIS — S80912A Unspecified superficial injury of left knee, initial encounter: Secondary | ICD-10-CM | POA: Diagnosis present

## 2020-02-20 DIAGNOSIS — Z7951 Long term (current) use of inhaled steroids: Secondary | ICD-10-CM | POA: Insufficient documentation

## 2020-02-20 DIAGNOSIS — Z87891 Personal history of nicotine dependence: Secondary | ICD-10-CM | POA: Diagnosis not present

## 2020-02-20 DIAGNOSIS — Z79899 Other long term (current) drug therapy: Secondary | ICD-10-CM | POA: Insufficient documentation

## 2020-02-20 DIAGNOSIS — I10 Essential (primary) hypertension: Secondary | ICD-10-CM | POA: Insufficient documentation

## 2020-02-20 DIAGNOSIS — M1712 Unilateral primary osteoarthritis, left knee: Secondary | ICD-10-CM | POA: Diagnosis not present

## 2020-02-20 DIAGNOSIS — J45909 Unspecified asthma, uncomplicated: Secondary | ICD-10-CM | POA: Insufficient documentation

## 2020-02-20 MED ORDER — METHOCARBAMOL 500 MG PO TABS: 500.0000 mg | ORAL_TABLET | Freq: Two times a day (BID) | ORAL | 0 refills | Status: DC

## 2020-02-20 MED ORDER — OXYCODONE-ACETAMINOPHEN 5-325 MG PO TABS
1.0000 | ORAL_TABLET | Freq: Once | ORAL | Status: AC
  Administered 2020-02-20: 1 via ORAL
  Filled 2020-02-20: qty 1

## 2020-02-20 MED ORDER — HYDROCODONE-ACETAMINOPHEN 5-325 MG PO TABS: 1.0000 | ORAL_TABLET | Freq: Four times a day (QID) | ORAL | 0 refills | Status: DC | PRN

## 2020-02-20 NOTE — ED Triage Notes (Addendum)
Pt presents to ED following fall Wednesday off her porch. Pt states she fell on left knee. Pt c/o leftt knee pain.

## 2020-02-20 NOTE — Discharge Instructions (Signed)
Your x-ray did not show any bony abnormalities but I am concerned that you may have a sprain or contusion. Take medications as prescribed but is important for you to follow-up with the orthopedic specialist for further management and evaluation. Return to the ER for worsening pain, swelling additional injuries, redness or warmth of your joint or trouble moving your joint.

## 2020-02-20 NOTE — ED Provider Notes (Signed)
[facility] EMERGENCY DEPARTMENT Provider Note   CSN: [id number] Arrival date & time: 02/20/20  0844     History Chief Complaint  Patient presents with  . Fall    Kathryn [name] is a 49 y.o. female with a past medical history of COPD, hypertension, osteoarthritis of left knee status post patellofemoral arthroplasty in July 2021 presenting to the ED with a chief complaint of left knee pain.  States that she has had issues with ambulating since her knee surgery.  She was on her porch when she fell off and landed on her left knee.  She has had pain that is gradually worsening since then.  Reports associated swelling as well.  Has not tried any medications to help with her symptoms.  Denies any other injuries, head injury, loss of consciousness, redness or warmth of joint, history of gout or septic joint, numbness or weakness.  HPI     Past Medical History:  Diagnosis Date  . Anxiety   . Arthritis   . Asthma   . Chronic back pain   . COPD (chronic obstructive pulmonary disease) (HCC)    Chronic bronchitis  . Depression   . GERD (gastroesophageal reflux disease)   . Hypertension   . Neuropathy   . Osteoarthritis of left knee, 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    Patient Active Problem List   Diagnosis Date Noted  . Intermittent palpitations 12/17/2019  . Snoring 12/17/2019  . Excessive daytime sleepiness 12/17/2019  . Choking 12/17/2019  . 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 Parasomnia 12/17/2019  . Suicidal ideation 12/24/2018  . Abdominal pain 12/24/2018  . Patellofemoral arthritis of left knee 10/08/2018  . Enlarged uterus 02/20/2018  . Screening for colorectal cancer 02/20/2018  . Routine cervical smear 02/20/2018  . Encounter for gynecological examination with Papanicolaou smear of cervix 02/20/2018  . Osteoarthritis of left knee, 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  . MDD (major depressive disorder), recurrent episode, severe ([place]) 12/24/2017  . Lumbar back pain  05/11/2017  . Depression, major, single episode, moderate ([place]) 05/11/2017  . Low vitamin B12 level 05/11/2017  . Immunization due 05/11/2017  . Hypertension goal BP (blood pressure) < 140/90 01/11/2017    Past Surgical History:  Procedure Laterality Date  . ESOPHAGOGASTRODUODENOSCOPY (EGD) WITH PROPOFOL N/A 12/25/2018   Procedure: ESOPHAGOGASTRODUODENOSCOPY (EGD) WITH PROPOFOL;  Surgeon: Rogene Houston, MD;  Location: [facility];  Service: Endoscopy;  Laterality: N/A;  . PATELLA-FEMORAL ARTHROPLASTY Left 10/08/2018   Procedure: PATELLA-FEMORAL ARTHROPLASTY;  Surgeon: Marchia Bond, MD;  Location: [facility] ORS;  Service: Orthopedics;  Laterality: Left;  . TUBAL LIGATION       OB History    Gravida  5   Para  3   Term  2   Preterm  1   AB  2   Living  3     SAB  1   TAB  1   Ectopic      Multiple      Live Births              Family History  Problem Relation Age of Onset  . Gout Paternal Grandfather   . Cirrhosis Paternal Grandfather   . Hypertension Paternal Grandmother   . Aneurysm Paternal Grandmother   . Cirrhosis Maternal Grandmother   . Cirrhosis Maternal Grandfather   . Cancer Father   . Cirrhosis Father   . Cirrhosis Mother   . Breast cancer Sister   . Hypertension Sister   . Bronchitis Daughter   .  Bronchitis Daughter   . Asthma Son   . Bronchitis Son   . Migraines Neg Hx     Social History   Tobacco Use  . Smoking status: Former Smoker    Types: Cigarettes    Quit date: 2016    Years since quitting: 5.8  . Smokeless tobacco: Never Used  . Tobacco comment: wears nicotine patches  Vaping Use  . Vaping Use: Never used  Substance Use Topics  . Alcohol use: Yes    Comment: occ  . Drug use: Not Currently    Types: Cocaine    Comment: crack  last used 2016    Home Medications Prior to Admission medications   Medication Sig Start Date End Date Taking? Authorizing Provider  albuterol (PROVENTIL HFA;VENTOLIN HFA) 108 (90 Base) MCG/ACT  inhaler Inhale 2 puffs into the lungs 2 (two) times daily.    [provider]  albuterol (PROVENTIL) (2.5 MG/3ML) 0.083% nebulizer solution Take 2.5 mg by nebulization every 6 (six) hours as needed for wheezing or shortness of breath.    [provider]  amLODipine (NORVASC) 5 MG tablet Take 5 mg by mouth daily.    [provider]  budesonide-formoterol (SYMBICORT) 80-4.5 MCG/ACT inhaler Inhale 2 puffs into the lungs 2 (two) times daily.    [provider]  citalopram (CELEXA) 20 MG tablet Take 20 mg by mouth daily.    [provider]  cloNIDine (CATAPRES) 0.1 MG tablet Take 0.1 mg by mouth daily. 11/04/19   [provider]  gabapentin (NEURONTIN) 300 MG capsule Take 1 capsule (300 mg total) by mouth 3 (three) times daily. 12/28/17   Starkes-Perry, Gayland Curry, FNP  HYDROcodone-acetaminophen (NORCO/VICODIN) 5-325 MG tablet Take 1 tablet by mouth every 6 (six) hours as needed. 02/20/20   Deveney Bayon, PA-C  methocarbamol (ROBAXIN) 500 MG tablet Take 1 tablet (500 mg total) by mouth 2 (two) times daily. 02/20/20   Roseland Braun, PA-C  pantoprazole (PROTONIX) 40 MG tablet Take 1 tablet (40 mg total) by mouth daily. 12/26/18 12/26/19  Manuella Ghazi, Pratik D, DO  tiZANidine (ZANAFLEX) 2 MG tablet Take 2 mg by mouth 2 (two) times daily as needed. 11/27/19   [provider]  torsemide (DEMADEX) 5 MG tablet Take 5 mg by mouth daily. 11/20/19   [provider]    Allergies    Clonopin [clonazepam], Fish allergy, Flexeril [cyclobenzaprine hcl], Ibuprofen, Shellfish allergy, Ace inhibitors, and Tramadol  Review of Systems   Review of Systems  Constitutional: Negative for chills and fever.  Musculoskeletal: Positive for arthralgias. Negative for myalgias.  Skin: Negative for wound.  Neurological: Negative for weakness and numbness.    Physical Exam Updated Vital Signs BP 133/89 (BP Location: Right Arm)   Pulse 99   Temp 98.4 F (36.9 C) (Oral)    Resp 18   Ht 4' 11.5" (1.511 m)   Wt 79.8 kg   LMP 02/15/2020   SpO2 99%   BMI 34.95 kg/m   Physical Exam Vitals and nursing note reviewed.  Constitutional:      General: She is not in acute distress.    Appearance: She is well-developed. She is not diaphoretic.  HENT:     Head: Normocephalic and atraumatic.  Eyes:     General: No scleral icterus.    Conjunctiva/sclera: Conjunctivae normal.  Pulmonary:     Effort: Pulmonary effort is normal. No respiratory distress.  Musculoskeletal:        General: Swelling and tenderness present.  Cervical back: Normal range of motion.     Comments: Tenderness to palpation and edema of the left knee without erythema or warmth of joint.  Able to flex and extend although reports pain with flexion.  2+ DP pulse noted bilaterally.  Normal sensation to light touch.  No overlying skin changes.  No leg swelling or calf tenderness noted.  Skin:    Findings: No rash.  Neurological:     Mental Status: She is alert.     ED Results / Procedures / Treatments   Labs (all labs ordered are listed, but only abnormal results are displayed) Labs Reviewed - No data to display  EKG None  Radiology DG Knee Complete 4 Views Left  Result Date: 02/20/2020 CLINICAL DATA:  Pain after fall. EXAM: LEFT KNEE - COMPLETE 4+ VIEW COMPARISON:  October 08, 2018. FINDINGS: Hemiarthroplasty. No evidence of hardware complication. No evidence of acute fracture or dislocation. Small joint effusion. Mild medial compartment degenerative change with marginal osteophytes. IMPRESSION: 1. Hemiarthroplasty without evidence of acute fracture or dislocation. 2. Small joint effusion. Electronically Signed   By: Margaretha Sheffield MD   On: 02/20/2020 09:45    Procedures Procedures (including critical care time)  Medications Ordered in ED Medications  oxyCODONE-acetaminophen (PERCOCET/ROXICET) 5-325 MG per tablet 1 tablet (has no administration in time range)    ED Course  I have  reviewed the triage vital signs and the nursing notes.  Pertinent labs & imaging results that were available during my care of the patient were reviewed by me and considered in my medical decision making (see chart for details).    MDM Rules/Calculators/A&P                          49 year old female with a prior left patellofemoral arthroplasty presenting to the ED for left knee pain after mechanical fall that occurred 2 days ago.  States that she landed on her left knee and has been having worsening pain and swelling since then.  Has not taken any medicine for pain.  On exam there is some tenderness and edema of the left knee with pain with flexion although able to perform range of motion.  Is neurovascularly intact.  No overlying skin changes.  X-rays negative for acute abnormality, does show a small joint effusion.  She remains ambulatory.  Suspect symptoms are due to a contusion or sprain.  Will treat with rice, orthopedic follow-up, muscle relaxer.  Return precautions given.  All imaging, if done today, including plain films, CT scans, and ultrasounds, independently reviewed by me, and interpretations confirmed via formal radiology reads.  Patient is hemodynamically stable, in NAD, and able to ambulate in the ED. Evaluation does not show pathology that would require ongoing emergent intervention or inpatient treatment. I explained the diagnosis to the patient. Pain has been managed and has no complaints prior to discharge. Patient is comfortable with above plan and is stable for discharge at this time. All questions were answered prior to disposition. Strict return precautions for returning to the ED were discussed. Encouraged follow up with PCP.   Prior to providing a prescription for a controlled substance, I independently reviewed the patient's recent prescription history on the [facility]. The patient had no recent or regular prescriptions and was  deemed appropriate for a brief, less than 3 day prescription of narcotic for acute analgesia.  An After Visit Summary was printed and given to the patient.  Portions of this note were generated with [employer]. Dictation errors may occur despite best attempts at proofreading.  Final Clinical Impression(s) / ED Diagnoses Final diagnoses:  Contusion of left knee, initial encounter    Rx / DC Orders ED Discharge Orders         Ordered    HYDROcodone-acetaminophen (NORCO/VICODIN) 5-325 MG tablet  Every 6 hours PRN        02/20/20 1230    methocarbamol (ROBAXIN) 500 MG tablet  2 times daily        02/20/20 [address], PA-C 02/20/20 Shields, [place], MD 02/21/20 [id number]

## 2020-02-23 ENCOUNTER — Telehealth: Payer: Self-pay

## 2020-02-23 NOTE — Telephone Encounter (Signed)
Transition Care Management Unsuccessful Follow-up Telephone Call  Date of discharge and from where:  02/20/2020 [facility]  Attempts:  1st Attempt  Reason for unsuccessful TCM follow-up call:  Left voice message

## 2020-02-24 ENCOUNTER — Other Ambulatory Visit: Payer: Medicaid Other

## 2020-02-24 ENCOUNTER — Other Ambulatory Visit: Payer: Self-pay

## 2020-02-24 ENCOUNTER — Ambulatory Visit
Admission: RE | Admit: 2020-02-24 | Discharge: 2020-02-24 | Disposition: A | Discharge status: Home / Self Care | Payer: Medicaid Other | Source: Ambulatory Visit | Attending: Orthopedic Surgery | Admitting: Orthopedic Surgery

## 2020-02-24 DIAGNOSIS — M545 Low back pain, unspecified: Secondary | ICD-10-CM

## 2020-02-24 DIAGNOSIS — M48061 Spinal stenosis, lumbar region without neurogenic claudication: Secondary | ICD-10-CM | POA: Diagnosis not present

## 2020-02-24 NOTE — Telephone Encounter (Signed)
Transition Care Management Unsuccessful Follow-up Telephone Call  Date of discharge and from where:  02/20/2020 [facility]  Attempts:  2nd Attempt  Reason for unsuccessful TCM follow-up call:  Left voice message

## 2020-02-25 NOTE — Telephone Encounter (Signed)
Transition Care Management Unsuccessful Follow-up Telephone Call  Date of discharge and from where:  [facility]/5/[facility]  Attempts:  3rd Attempt  Reason for unsuccessful TCM follow-up call:  Left voice message

## 2020-03-01 DIAGNOSIS — M545 Low back pain, unspecified: Secondary | ICD-10-CM | POA: Diagnosis not present

## 2020-03-17 DIAGNOSIS — M48061 Spinal stenosis, lumbar region without neurogenic claudication: Secondary | ICD-10-CM | POA: Diagnosis not present

## 2020-03-18 DIAGNOSIS — N644 Mastodynia: Secondary | ICD-10-CM | POA: Diagnosis not present

## 2020-03-18 DIAGNOSIS — E559 Vitamin D deficiency, unspecified: Secondary | ICD-10-CM | POA: Diagnosis not present

## 2020-03-18 DIAGNOSIS — N390 Urinary tract infection, site not specified: Secondary | ICD-10-CM | POA: Diagnosis not present

## 2020-03-18 DIAGNOSIS — I10 Essential (primary) hypertension: Secondary | ICD-10-CM | POA: Diagnosis not present

## 2020-03-19 ENCOUNTER — Ambulatory Visit (INDEPENDENT_AMBULATORY_CARE_PROVIDER_SITE_OTHER): Payer: Medicaid Other | Admitting: Internal Medicine

## 2020-03-19 ENCOUNTER — Encounter: Payer: Self-pay | Admitting: Internal Medicine

## 2020-03-19 ENCOUNTER — Other Ambulatory Visit: Payer: Self-pay

## 2020-03-19 VITALS — BP 138/86 | HR 65 | Temp 98.6°F | Resp 18 | Ht 59.0 in | Wt 183.1 lb

## 2020-03-19 DIAGNOSIS — Z23 Encounter for immunization: Secondary | ICD-10-CM

## 2020-03-19 DIAGNOSIS — Z7689 Persons encountering health services in other specified circumstances: Secondary | ICD-10-CM

## 2020-03-19 DIAGNOSIS — M545 Low back pain, unspecified: Secondary | ICD-10-CM

## 2020-03-19 DIAGNOSIS — G4700 Insomnia, unspecified: Secondary | ICD-10-CM | POA: Diagnosis not present

## 2020-03-19 DIAGNOSIS — Z1231 Encounter for screening mammogram for malignant neoplasm of breast: Secondary | ICD-10-CM

## 2020-03-19 DIAGNOSIS — F332 Major depressive disorder, recurrent severe without psychotic features: Secondary | ICD-10-CM

## 2020-03-19 DIAGNOSIS — G4719 Other hypersomnia: Secondary | ICD-10-CM | POA: Diagnosis not present

## 2020-03-19 DIAGNOSIS — Z1211 Encounter for screening for malignant neoplasm of colon: Secondary | ICD-10-CM | POA: Diagnosis not present

## 2020-03-19 DIAGNOSIS — E669 Obesity, unspecified: Secondary | ICD-10-CM | POA: Diagnosis not present

## 2020-03-19 DIAGNOSIS — J4540 Moderate persistent asthma, uncomplicated: Secondary | ICD-10-CM | POA: Insufficient documentation

## 2020-03-19 DIAGNOSIS — I10 Essential (primary) hypertension: Secondary | ICD-10-CM | POA: Diagnosis not present

## 2020-03-19 DIAGNOSIS — M1712 Unilateral primary osteoarthritis, left knee: Secondary | ICD-10-CM

## 2020-03-19 DIAGNOSIS — Z6838 Body mass index (BMI) 38.0-38.9, adult: Secondary | ICD-10-CM | POA: Insufficient documentation

## 2020-03-19 DIAGNOSIS — K219 Gastro-esophageal reflux disease without esophagitis: Secondary | ICD-10-CM

## 2020-03-19 DIAGNOSIS — J4530 Mild persistent asthma, uncomplicated: Secondary | ICD-10-CM

## 2020-03-19 MED ORDER — HYDROXYZINE PAMOATE 50 MG PO CAPS: 50.0000 mg | ORAL_CAPSULE | Freq: Every day | ORAL | 5 refills | Status: DC | PRN

## 2020-03-19 MED ORDER — BUDESONIDE-FORMOTEROL FUMARATE 80-4.5 MCG/ACT IN AERO: 2.0000 | INHALATION_SPRAY | Freq: Two times a day (BID) | RESPIRATORY_TRACT | 5 refills | Status: DC

## 2020-03-19 NOTE — Assessment & Plan Note (Signed)
BP Readings from Last 1 Encounters:  03/19/20 138/86   Well-controlled with amlodipine, clonidine and torsemide Counseled for compliance with the medications Advised DASH diet and moderate exercise/walking, at least 150 mins/week

## 2020-03-19 NOTE — Assessment & Plan Note (Signed)
S/p arthroplasty C/o right knee pain, follows up with Orthopedic surgeon

## 2020-03-19 NOTE — Assessment & Plan Note (Signed)

## 2020-03-19 NOTE — Assessment & Plan Note (Signed)
Takes Hydroxyzine 50 mg qHS PRN, refills provided.

## 2020-03-19 NOTE — Assessment & Plan Note (Signed)
Diet modification and moderate exercise advised. 

## 2020-03-19 NOTE — Assessment & Plan Note (Addendum)
On Celexa, well-controlled Follows up with Psychiatrist

## 2020-03-19 NOTE — Progress Notes (Signed)
New Patient Office Visit  Subjective:  Patient ID: Kathryn [name], female    DOB: [birth year]  Age: 49 y.o. MRN: [id number]  CC:  Chief Complaint  Patient presents with  . New Patient (Initial Visit)    new pt was seeing [facility] her right leg is bothering her really bad her lower back is bothering her but she gets injections in it     HPI Kathryn [name] is a 49 year old female with past medical history of hypertension, asthma, GERD, insomnia, mild sleep apnea, depression, chronic low back pain and knee pain, and obesity who presents for establishing care.  She complains of acute on chronic low back pain, which is sharp, radiating to bilateral LE, worse with movement and better with rest, for which she was evaluated by orthopedic surgeon recently.  She received steroid injection for that.  She had temporary improvement in back pain after injection, but it has recurred now.  She denies any numbness, tingling or weakness in the feet.  BP is well-controlled. Takes medications regularly. Patient denies headache, dizziness, chest pain, dyspnea or palpitations.  She complains of insomnia, for which he takes hydroxyzine at bedtime as needed. She requests refills for it.  She has had sleep study in the past, but did not follow up with her Neurologist to discuss sleep studies and further management.  She takes Celexa for her depression, and sees a Teacher, music for it.  She is planning to see her new Psychiatrist.  Patient has not had colonoscopy yet.  GI referral provided.  Patient is due for mammography.  Scheduled mammography.  Patient has had both doses of COVID vaccine.  She received flu vaccine in the office today.  Past Medical History:  Diagnosis Date  . Anxiety   . Arthritis   . Asthma   . Chronic back pain   . COPD (chronic obstructive pulmonary disease) (HCC)    Chronic bronchitis  . Depression   . GERD (gastroesophageal reflux disease)   . Hypertension   .  Neuropathy   . Osteoarthritis of left knee, 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  . Suicidal ideation 12/24/2018    Past Surgical History:  Procedure Laterality Date  . ESOPHAGOGASTRODUODENOSCOPY (EGD) WITH PROPOFOL N/A 12/25/2018   Procedure: ESOPHAGOGASTRODUODENOSCOPY (EGD) WITH PROPOFOL;  Surgeon: Rogene Houston, MD;  Location: [facility];  Service: Endoscopy;  Laterality: N/A;  . PATELLA-FEMORAL ARTHROPLASTY Left 10/08/2018   Procedure: PATELLA-FEMORAL ARTHROPLASTY;  Surgeon: Marchia Bond, MD;  Location: [facility];  Service: Orthopedics;  Laterality: Left;  . TUBAL LIGATION      Family History  Problem Relation Age of Onset  . Gout Paternal Grandfather   . Cirrhosis Paternal Grandfather   . Hypertension Paternal Grandmother   . Aneurysm Paternal Grandmother   . Cirrhosis Maternal Grandmother   . Cirrhosis Maternal Grandfather   . Cancer Father   . Cirrhosis Father   . Cirrhosis Mother   . Breast cancer Sister   . Hypertension Sister   . Bronchitis Daughter   . Bronchitis Daughter   . Asthma Son   . Bronchitis Son   . Migraines Neg Hx     Social History   Socioeconomic History  . Marital status: Single    Spouse name: Not on file  . Number of children: Not on file  . Years of education: Not on file  . Highest education level: Not on file  Occupational History  . Not on file  Tobacco Use  . Smoking status: Former Smoker  Types: Cigarettes    Quit date: 2016    Years since quitting: 5.9  . Smokeless tobacco: Never Used  . Tobacco comment: wears nicotine patches  Vaping Use  . Vaping Use: Never used  Substance and Sexual Activity  . Alcohol use: Yes    Comment: occ  . Drug use: Not Currently    Types: Cocaine    Comment: crack  last used 2016  . Sexual activity: Yes    Birth control/protection: Surgical    Comment: tubal  Other Topics Concern  . Not on file  Social History Narrative  . Not on file   Social Determinants of Health   Financial Resource Strain:    . Difficulty of Paying Living Expenses: Not on file  Food Insecurity:   . Worried About Charity fundraiser in the Last Year: Not on file  . Ran Out of Food in the Last Year: Not on file  Transportation Needs:   . Lack of Transportation (Medical): Not on file  . Lack of Transportation (Non-Medical): Not on file  Physical Activity:   . Days of Exercise per Week: Not on file  . Minutes of Exercise per Session: Not on file  Stress:   . Feeling of Stress : Not on file  Social Connections:   . Frequency of Communication with Friends and Family: Not on file  . Frequency of Social Gatherings with Friends and Family: Not on file  . Attends Religious Services: Not on file  . Active Member of Clubs or Organizations: Not on file  . Attends Archivist Meetings: Not on file  . Marital Status: Not on file  Intimate Partner Violence:   . Fear of Current or Ex-Partner: Not on file  . Emotionally Abused: Not on file  . Physically Abused: Not on file  . Sexually Abused: Not on file    ROS Review of Systems  Constitutional: Negative for chills and fever.  HENT: Negative for congestion, sinus pressure, sinus pain and sore throat.   Eyes: Negative for pain and discharge.  Respiratory: Negative for cough and shortness of breath.   Cardiovascular: Negative for chest pain and palpitations.  Gastrointestinal: Negative for abdominal pain, constipation, diarrhea, nausea and vomiting.  Endocrine: Negative for polydipsia and polyuria.  Genitourinary: Negative for dysuria and hematuria.  Musculoskeletal: Positive for arthralgias and back pain. Negative for neck pain and neck stiffness.  Skin: Negative for rash.  Neurological: Negative for dizziness and weakness.  Psychiatric/Behavioral: Positive for dysphoric mood and sleep disturbance. Negative for agitation and behavioral problems.    Objective:   Today's Vitals: BP 138/86 (BP Location: Right Arm, Patient Position: Sitting, Cuff Size:  Normal)   Pulse 65   Temp 98.6 F (37 C) (Oral)   Resp 18   Ht 4\' 11"  (1.499 m)   Wt 183 lb 1.9 oz (83.1 kg)   SpO2 100%   BMI 36.99 kg/m   Physical Exam Vitals reviewed.  Constitutional:      General: She is not in acute distress.    Appearance: She is obese. She is not diaphoretic.  HENT:     Head: Normocephalic and atraumatic.     Nose: Nose normal.     Mouth/Throat:     Mouth: Mucous membranes are moist.  Eyes:     General: No scleral icterus.    Extraocular Movements: Extraocular movements intact.     Pupils: Pupils are equal, round, and reactive to light.  Cardiovascular:  Rate and Rhythm: Normal rate and regular rhythm.     Pulses: Normal pulses.     Heart sounds: Normal heart sounds. No murmur heard.   Pulmonary:     Breath sounds: Normal breath sounds. No wheezing or rales.  Abdominal:     Palpations: Abdomen is soft.     Tenderness: There is no abdominal tenderness.  Musculoskeletal:     Cervical back: Neck supple. No tenderness.     Right lower leg: No edema.     Left lower leg: No edema.  Skin:    General: Skin is warm.     Findings: No rash.  Neurological:     General: No focal deficit present.     Mental Status: She is alert and oriented to person, place, and time.     Sensory: No sensory deficit.     Motor: No weakness.  Psychiatric:        Mood and Affect: Mood normal.        Behavior: Behavior normal.      Assessment & Plan:   Problem List Items Addressed This Visit      Encounter to establish care - Primary   Care established Previous chart reviewed History and medications reviewed with the patient      Cardiovascular and Mediastinum   Primary hypertension    BP Readings from Last 1 Encounters:  03/19/20 138/86   Well-controlled with amlodipine, clonidine and torsemide Counseled for compliance with the medications Advised DASH diet and moderate exercise/walking, at least 150 mins/week         Respiratory   Mild  persistent asthma without complication    On Symbicort and Albuterol (PRN) Refilled Symbicort Advised to use Albuterol only as needed as rescue inhaler.      Relevant Medications   budesonide-formoterol (SYMBICORT) 80-4.5 MCG/ACT inhaler     Musculoskeletal and Integument   Osteoarthritis of left knee, patellofemoral    S/p arthroplasty C/o right knee pain, follows up with Orthopedic surgeon      Relevant Medications   celecoxib (CELEBREX) 100 MG capsule     Other   Lumbar back pain    Likely lumbar radiculopathy Takes Norco as needed On gabapentin S/p steroid injection recently, follows up with Orthopedic surgeon      Relevant Medications   celecoxib (CELEBREX) 100 MG capsule   MDD (major depressive disorder), recurrent episode, severe ([place])    On Celexa, well-controlled Follows up with Psychiatrist      Relevant Medications   hydrOXYzine (VISTARIL) 50 MG capsule         Excessive daytime sleepiness    Related to sleep apnea vs sleep terror Has been evaluated by Neurologist, needs a follow up appointment, advised to follow up with Neurologist.      Insomnia    Takes Hydroxyzine 50 mg qHS PRN, refills provided.      Relevant Medications   hydrOXYzine (VISTARIL) 50 MG capsule   Need for immunization against influenza    Patient was educated on the recommendation for flu vaccine. After obtaining informed consent, the vaccine was administered no adverse effects noted at time of administration. Patient provided with education on arm soreness and use of tylenol or ibuprofen (if safe) for this. Encourage to use the arm vaccine was given in to help reduce the soreness. Patient educated on the signs of a reaction to the vaccine and advised to contact the office should these occur.      Relevant Orders   Flu  Vaccine QUAD 36+ mos IM (Completed)   Obesity (BMI 30-39.9)    Diet modification and moderate exercise advised       Other Visit Diagnoses    Screening  mammogram for breast cancer       Relevant Orders   MM Digital Screening   Special screening for malignant neoplasms, colon       Relevant Orders   Ambulatory referral to Gastroenterology      Outpatient Encounter Medications as of 03/19/2020  Medication Sig  . albuterol (PROVENTIL HFA;VENTOLIN HFA) 108 (90 Base) MCG/ACT inhaler Inhale 2 puffs into the lungs 2 (two) times daily.  Marland Kitchen albuterol (PROVENTIL) (2.5 MG/3ML) 0.083% nebulizer solution Take 2.5 mg by nebulization every 6 (six) hours as needed for wheezing or shortness of breath.  Marland Kitchen amLODipine (NORVASC) 5 MG tablet Take 5 mg by mouth daily.  . budesonide-formoterol (SYMBICORT) 80-4.5 MCG/ACT inhaler Inhale 2 puffs into the lungs 2 (two) times daily.  . celecoxib (CELEBREX) 100 MG capsule Take 100 mg by mouth 2 (two) times daily.  . citalopram (CELEXA) 20 MG tablet Take 20 mg by mouth daily.  . cloNIDine (CATAPRES) 0.1 MG tablet Take 0.1 mg by mouth daily.  . hydrOXYzine (VISTARIL) 50 MG capsule Take 1 capsule (50 mg total) by mouth daily as needed for anxiety (Insomnia).  . nitrofurantoin, macrocrystal-monohydrate, (MACROBID) 100 MG capsule Take 100 mg by mouth 2 (two) times daily.  . pregabalin (LYRICA) 75 MG capsule Take 75 mg by mouth 2 (two) times daily.  Marland Kitchen tiZANidine (ZANAFLEX) 2 MG tablet Take 2 mg by mouth 2 (two) times daily as needed.  . torsemide (DEMADEX) 5 MG tablet Take 5 mg by mouth daily.  . [DISCONTINUED] budesonide-formoterol (SYMBICORT) 80-4.5 MCG/ACT inhaler Inhale 2 puffs into the lungs 2 (two) times daily.  . [DISCONTINUED] hydrOXYzine (VISTARIL) 50 MG capsule Take 50 mg by mouth daily as needed.  Marland Kitchen HYDROcodone-acetaminophen (NORCO/VICODIN) 5-325 MG tablet Take 1 tablet by mouth every 6 (six) hours as needed. (Patient not taking: Reported on 03/19/2020)  . methocarbamol (ROBAXIN) 500 MG tablet Take 1 tablet (500 mg total) by mouth 2 (two) times daily. (Patient not taking: Reported on 03/19/2020)  . pantoprazole  (PROTONIX) 40 MG tablet Take 1 tablet (40 mg total) by mouth daily.  . [DISCONTINUED] gabapentin (NEURONTIN) 300 MG capsule Take 1 capsule (300 mg total) by mouth 3 (three) times daily. (Patient not taking: Reported on 03/19/2020)   No facility-administered encounter medications on file as of 03/19/2020.    Follow-up: Return in about 4 months (around 07/18/2020).   Lindell Spar, MD

## 2020-03-19 NOTE — Assessment & Plan Note (Signed)
Likely lumbar radiculopathy Takes Norco as needed On gabapentin S/p steroid injection recently, follows up with Orthopedic surgeon

## 2020-03-19 NOTE — Assessment & Plan Note (Signed)
Related to sleep apnea vs sleep terror Has been evaluated by Neurologist, needs a follow up appointment, advised to follow up with Neurologist.

## 2020-03-19 NOTE — Assessment & Plan Note (Signed)
Care established Previous chart reviewed History and medications reviewed with the patient 

## 2020-03-19 NOTE — Assessment & Plan Note (Signed)
On Protonix 

## 2020-03-19 NOTE — Patient Instructions (Addendum)
Please call your Neurologist office to discuss sleep studies and further management.  Please continue to follow up with your Orthopedic surgeon for management of chronic back pain and knee pain.  Please continue to take Amlodipine, Clonidine and Torsemide as prescribed.  Please follow DASH diet and perform moderate exercise/walking as tolerated.  DASH stands for Dietary Approaches to Stop Hypertension. The DASH diet is a healthy-eating plan designed to help treat or prevent high blood pressure (hypertension).  The DASH diet includes foods that are rich in potassium, calcium and magnesium. These nutrients help control blood pressure. The diet limits foods that are high in sodium, saturated fat and added sugars.  Studies have shown that the DASH diet can lower blood pressure in as little as two weeks. The diet can also lower low-density lipoprotein (LDL or "bad") cholesterol levels in the blood. High blood pressure and high LDL cholesterol levels are two major risk factors for heart disease and stroke.    DASH diet: Recommended servings The DASH diet provides daily and weekly nutritional goals. The number of servings you should have depends on your daily calorie needs.  Here's a look at the recommended servings from each food group for a 2,000-calorie-a-day DASH diet:  Grains: 6 to 8 servings a day. One serving is one slice bread, 1 ounce dry cereal, or 1/2 cup cooked cereal, rice or pasta. Vegetables: 4 to 5 servings a day. One serving is 1 cup raw leafy green vegetable, 1/2 cup cut-up raw or cooked vegetables, or 1/2 cup vegetable juice. Fruits: 4 to 5 servings a day. One serving is one medium fruit, 1/2 cup fresh, frozen or canned fruit, or 1/2 cup fruit juice. Fat-free or low-fat dairy products: 2 to 3 servings a day. One serving is 1 cup milk or yogurt, or 1 1/2 ounces cheese. Lean meats, poultry and fish: six 1-ounce servings or fewer a day. One serving is 1 ounce cooked meat, poultry or  fish, or 1 egg. Nuts, seeds and legumes: 4 to 5 servings a week. One serving is 1/3 cup nuts, 2 tablespoons peanut butter, 2 tablespoons seeds, or 1/2 cup cooked legumes (dried beans or peas). Fats and oils: 2 to 3 servings a day. One serving is 1 teaspoon soft margarine, 1 teaspoon vegetable oil, 1 tablespoon mayonnaise or 2 tablespoons salad dressing. Sweets and added sugars: 5 servings or fewer a week. One serving is 1 tablespoon sugar, jelly or jam, 1/2 cup sorbet, or 1 cup lemonade.

## 2020-03-19 NOTE — Assessment & Plan Note (Signed)
On Symbicort and Albuterol (PRN) Refilled Symbicort Advised to use Albuterol only as needed as rescue inhaler.

## 2020-03-23 ENCOUNTER — Encounter (INDEPENDENT_AMBULATORY_CARE_PROVIDER_SITE_OTHER): Payer: Self-pay | Admitting: *Deleted

## 2020-03-23 DIAGNOSIS — M48061 Spinal stenosis, lumbar region without neurogenic claudication: Secondary | ICD-10-CM | POA: Diagnosis not present

## 2020-03-23 DIAGNOSIS — M545 Low back pain, unspecified: Secondary | ICD-10-CM | POA: Diagnosis not present

## 2020-03-29 DIAGNOSIS — M4316 Spondylolisthesis, lumbar region: Secondary | ICD-10-CM | POA: Diagnosis present

## 2020-03-29 DIAGNOSIS — M545 Low back pain, unspecified: Secondary | ICD-10-CM | POA: Diagnosis not present

## 2020-03-29 DIAGNOSIS — M48062 Spinal stenosis, lumbar region with neurogenic claudication: Secondary | ICD-10-CM | POA: Diagnosis not present

## 2020-03-30 ENCOUNTER — Other Ambulatory Visit: Payer: Self-pay | Admitting: Neurosurgery

## 2020-04-07 ENCOUNTER — Telehealth: Payer: Medicaid Other | Admitting: Internal Medicine

## 2020-04-07 ENCOUNTER — Other Ambulatory Visit: Payer: Self-pay

## 2020-04-10 DIAGNOSIS — H5213 Myopia, bilateral: Secondary | ICD-10-CM | POA: Diagnosis not present

## 2020-04-14 ENCOUNTER — Ambulatory Visit: Payer: Medicaid Other | Admitting: Family Medicine

## 2020-04-14 ENCOUNTER — Other Ambulatory Visit: Payer: Self-pay

## 2020-04-14 ENCOUNTER — Encounter: Payer: Self-pay | Admitting: Family Medicine

## 2020-04-14 VITALS — BP 110/77 | HR 66 | Ht 59.0 in | Wt 179.4 lb

## 2020-04-14 DIAGNOSIS — J309 Allergic rhinitis, unspecified: Secondary | ICD-10-CM | POA: Diagnosis not present

## 2020-04-14 DIAGNOSIS — G4750 Parasomnia, unspecified: Secondary | ICD-10-CM

## 2020-04-14 DIAGNOSIS — K219 Gastro-esophageal reflux disease without esophagitis: Secondary | ICD-10-CM

## 2020-04-14 DIAGNOSIS — J45901 Unspecified asthma with (acute) exacerbation: Secondary | ICD-10-CM | POA: Diagnosis not present

## 2020-04-14 DIAGNOSIS — G4719 Other hypersomnia: Secondary | ICD-10-CM | POA: Diagnosis not present

## 2020-04-14 DIAGNOSIS — T17308A Unspecified foreign body in larynx causing other injury, initial encounter: Secondary | ICD-10-CM

## 2020-04-14 NOTE — Patient Instructions (Signed)
Below is our plan:  We will continue to monitor symptoms. I would like for you to call your PCP today to report new onset of productive cough and wheezing. I hear inspiratory and expiratory wheezes today in all lung fields. Please monitor closely for any worsening symptoms and follow up immediately with your PCP or ER for fever, chest pain, or trouble breathing. Your sleep study did not show any sleep apnea concerns but did show concerns for vivid dreams. This could be causing the chocking sensation but I am also worried about chronic allergies and acid reflux as possible causes as well. I would recommend discussing with your PCP. Consider referral to sleep psychiatry if needed.   Please make sure you are staying well hydrated. I recommend 50-60 ounces daily. Well balanced diet and regular exercise encouraged.    Please continue follow up with care team as directed.   Follow up with Dr Brett Fairy in 3-6 months if you are no better.   You may receive a survey regarding today's visit. I encourage you to leave honest feed back as I do use this information to improve patient care. Thank you for seeing me today!     Quality Sleep Information, Adult Quality sleep is important for your mental and physical health. It also improves your quality of life. Quality sleep means you:  Are asleep for most of the time you are in bed.  Fall asleep within 30 minutes.  Wake up no more than once a night.  Are awake for no longer than 20 minutes if you do wake up during the night. Most adults need 7-8 hours of quality sleep each night. How can poor sleep affect me? If you do not get enough quality sleep, you may have:  Mood swings.  Daytime sleepiness.  Confusion.  Decreased reaction time.  Sleep disorders, such as insomnia and sleep apnea.  Difficulty with: ? Solving problems. ? Coping with stress. ? Paying attention. These issues may affect your performance and productivity at work, school,  and at home. Lack of sleep may also put you at higher risk for accidents, suicide, and risky behaviors. If you do not get quality sleep you may also be at higher risk for several health problems, including:  Infections.  Type 2 diabetes.  Heart disease.  High blood pressure.  Obesity.  Worsening of long-term conditions, like arthritis, kidney disease, depression, Parkinson's disease, and epilepsy. What actions can I take to get more quality sleep?      Stick to a sleep schedule. Go to sleep and wake up at about the same time each day. Do not try to sleep less on weekdays and make up for lost sleep on weekends. This does not work.  Try to get about 30 minutes of exercise on most days. Do not exercise 2-3 hours before going to bed.  Limit naps during the day to 30 minutes or less.  Do not use any products that contain nicotine or tobacco, such as cigarettes or e-cigarettes. If you need help quitting, ask your health care provider.  Do not drink caffeinated beverages for at least 8 hours before going to bed. Coffee, tea, and some sodas contain caffeine.  Do not drink alcohol close to bedtime.  Do not eat large meals close to bedtime.  Do not take naps in the late afternoon.  Try to get at least 30 minutes of sunlight every day. Morning sunlight is best.  Make time to relax before bed. Reading, listening to music,  or taking a hot bath promotes quality sleep.  Make your bedroom a place that promotes quality sleep. Keep your bedroom dark, quiet, and at a comfortable room temperature. Make sure your bed is comfortable. Take out sleep distractions like TV, a computer, smartphone, and bright lights.  If you are lying awake in bed for longer than 20 minutes, get up and do a relaxing activity until you feel sleepy.  Work with your health care provider to treat medical conditions that may affect sleeping, such as: ? Nasal obstruction. ? Snoring. ? Sleep apnea and other sleep  disorders.  Talk to your health care provider if you think any of your prescription medicines may cause you to have difficulty falling or staying asleep.  If you have sleep problems, talk with a sleep consultant. If you think you have a sleep disorder, talk with your health care provider about getting evaluated by a specialist. Where to find more information  National Sleep Foundation website: https://sleepfoundation.org  National Heart, Lung, and Blood Institute (NHLBI): https://hall.info/.pdf  Centers for Disease Control and Prevention (CDC): [employer]ail[employer].is Contact a health care provider if you:  Have trouble getting to sleep or staying asleep.  Often wake up very early in the morning and cannot get back to sleep.  Have daytime sleepiness.  Have daytime sleep attacks of suddenly falling asleep and sudden muscle weakness (narcolepsy).  Have a tingling sensation in your legs with a strong urge to move your legs (restless legs syndrome).  Stop breathing briefly during sleep (sleep apnea).  Think you have a sleep disorder or are taking a medicine that is affecting your quality of sleep. Summary  Most adults need 7-8 hours of quality sleep each night.  Getting enough quality sleep is an important part of health and well-being.  Make your bedroom a place that promotes quality sleep and avoid things that may cause you to have poor sleep, such as alcohol, caffeine, smoking, and large meals.  Talk to your health care provider if you have trouble falling asleep or staying asleep. This information is not intended to replace advice given to you by your health care provider. Make sure you discuss any questions you have with your health care provider. Document Revised: 07/11/2017 Document Reviewed: 07/11/2017 Elsevier Patient Education  2020 [employer]   Insomnia Insomnia is a sleep disorder that makes it difficult to fall asleep or  stay asleep. Insomnia can cause fatigue, low energy, difficulty concentrating, mood swings, and poor performance at work or school. There are three different ways to classify insomnia:  Difficulty falling asleep.  Difficulty staying asleep.  Waking up too early in the morning. Any type of insomnia can be long-term (chronic) or short-term (acute). Both are common. Short-term insomnia usually lasts for three months or less. Chronic insomnia occurs at least three times a week for longer than three months. What are the causes? Insomnia may be caused by another condition, situation, or substance, such as:  Anxiety.  Certain medicines.  Gastroesophageal reflux disease (GERD) or other gastrointestinal conditions.  Asthma or other breathing conditions.  Restless legs syndrome, sleep apnea, or other sleep disorders.  Chronic pain.  Menopause.  Stroke.  Abuse of alcohol, tobacco, or illegal drugs.  Mental health conditions, such as depression.  Caffeine.  Neurological disorders, such as Alzheimer's disease.  An overactive thyroid (hyperthyroidism). Sometimes, the cause of insomnia may not be known. What increases the risk? Risk factors for insomnia include:  Gender. Women are affected more often than men.  Age. Insomnia is more common as you get older.  Stress.  Lack of exercise.  Irregular work schedule or working night shifts.  Traveling between different time zones.  Certain medical and mental health conditions. What are the signs or symptoms? If you have insomnia, the main symptom is having trouble falling asleep or having trouble staying asleep. This may lead to other symptoms, such as:  Feeling fatigued or having low energy.  Feeling nervous about going to sleep.  Not feeling rested in the morning.  Having trouble concentrating.  Feeling irritable, anxious, or depressed. How is this diagnosed? This condition may be diagnosed based on:  Your symptoms  and medical history. Your health care provider may ask about: ? Your sleep habits. ? Any medical conditions you have. ? Your mental health.  A physical exam. How is this treated? Treatment for insomnia depends on the cause. Treatment may focus on treating an underlying condition that is causing insomnia. Treatment may also include:  Medicines to help you sleep.  Counseling or therapy.  Lifestyle adjustments to help you sleep better. Follow these instructions at home: Eating and drinking   Limit or avoid alcohol, caffeinated beverages, and cigarettes, especially close to bedtime. These can disrupt your sleep.  Do not eat a large meal or eat spicy foods right before bedtime. This can lead to digestive discomfort that can make it hard for you to sleep. Sleep habits   Keep a sleep diary to help you and your health care provider figure out what could be causing your insomnia. Write down: ? When you sleep. ? When you wake up during the night. ? How well you sleep. ? How rested you feel the next day. ? Any side effects of medicines you are taking. ? What you eat and drink.  Make your bedroom a dark, comfortable place where it is easy to fall asleep. ? Put up shades or blackout curtains to block light from outside. ? Use a white noise machine to block noise. ? Keep the temperature cool.  Limit screen use before bedtime. This includes: ? Watching TV. ? Using your smartphone, tablet, or computer.  Stick to a routine that includes going to bed and waking up at the same times every day and night. This can help you fall asleep faster. Consider making a quiet activity, such as reading, part of your nighttime routine.  Try to avoid taking naps during the day so that you sleep better at night.  Get out of bed if you are still awake after 15 minutes of trying to sleep. Keep the lights down, but try reading or doing a quiet activity. When you feel sleepy, go back to bed. General  instructions  Take over-the-counter and prescription medicines only as told by your health care provider.  Exercise regularly, as told by your health care provider. Avoid exercise starting several hours before bedtime.  Use relaxation techniques to manage stress. Ask your health care provider to suggest some techniques that may work well for you. These may include: ? Breathing exercises. ? Routines to release muscle tension. ? Visualizing peaceful scenes.  Make sure that you drive carefully. Avoid driving if you feel very sleepy.  Keep all follow-up visits as told by your health care provider. This is important. Contact a health care provider if:  You are tired throughout the day.  You have trouble in your daily routine due to sleepiness.  You continue to have sleep problems, or your sleep problems get worse. Get  help right away if:  You have serious thoughts about hurting yourself or someone else. If you ever feel like you may hurt yourself or others, or have thoughts about taking your own life, get help right away. You can go to your nearest emergency department or call:  Your local emergency services (911 in the [place]  A suicide crisis helpline, such as the [facility] at (403) 122-4443. This is open 24 hours a day. Summary  Insomnia is a sleep disorder that makes it difficult to fall asleep or stay asleep.  Insomnia can be long-term (chronic) or short-term (acute).  Treatment for insomnia depends on the cause. Treatment may focus on treating an underlying condition that is causing insomnia.  Keep a sleep diary to help you and your health care provider figure out what could be causing your insomnia. This information is not intended to replace advice given to you by your health care provider. Make sure you discuss any questions you have with your health care provider. Document Revised: 03/16/2017 Document Reviewed: 01/11/2017 Elsevier Patient  Education  2020 [employer].

## 2020-04-14 NOTE — Progress Notes (Signed)
Chief Complaint  Patient presents with  . Sleep results    RM 3 alone Pt states she has not been sleeping well, pain/asthma keeps her awake most of the night as well as snoring     HISTORY OF PRESENT ILLNESS: Today 04/14/20  Kathryn [name] is a 49 y.o. female here today for follow up for parasomnia. Sleep study in 12/2019 was negative for sleep apnea and PLMD. Parasomnia activity in the form of vivid dreams, sleep talking and sleep arousals with confusion were noted. She continues to feel that she chokes at night. She feels that she gets strangled when she wakes up. She gets nervous and can't go back to sleep. PCP is treating asthma with Symbicort and albuterol. She reports that she always has "rattling" but just started with a productive cough this morning. She has noticed green and black phlegm. She denies fever or chest pain but does feel that she is having a hard time breathing. She also has chronic allergies on Claritin. She can not tolerate Singulair. She does have chronic GERD on Protonix. She is taking citalopram, hydroxyzine and clonidine for anxiety. She is followed by [facility].    HISTORY (copied from previous note)  Kathryn Mizrachi Priceis a 49 y.o. Black or African American female patientand seen here in a Sleep medicine consultation  quested by Dr. Selena Batten, visit date is  12/17/2019   Chiefconcernaccording to patient : " I can strangled in my sleep"  I have the pleasure of seeing Kathryn [name] today,a right -handed Black or [place] American female who  has a past medical history of Anxiety, Bipolar disorder, Schizophrenia, Arthritis, Asthma, Chronic back pain, COPD (chronic obstructive pulmonary disease) (HCC), Depression, GERD (gastroesophageal reflux disease), Hypertension, Tobacco abuse, and Osteoarthritis of left knee, patellofemoral (12/27/2017).  Sleeprelevant medical history: Nocturia 6-7 times , has been  Sleep walking, with recurrent night mares,  Night terrors other  Parasomnia. Familymedical /sleep history:son is the  other family member with OSA, insomnia, and he is a sleep walkers.  Social history:Patient is unemployed - seeking disability,  from laundry services, and lives in a household with spouse and a granddaughter, 62 years old. . Tobacco use- 2017 quit.  ETOH use 3/ monthor less ,  Caffeine intake in form of Tea ( 3/day) . Regular exercise in form of none- since knee surgery 2019.    Sleep habits are as follows:The patient's dinner time is between 6 PM.  The patient goes to bed at 9 PM and she is asleep promptly, continues to sleep for 1-2 hour intervals , wakes for many bathroom breaks, the first time at 12 AM.   The preferred sleep position is supine, with the support of 1 pillow.  Dreams are reportedly frequent/vivid/ and kicks and yells in her sleep. She wakes with palpitations. Marland Kitchen  5  AM is the usual rise time. The patient wakes up spontaneously.  She reports not feeling refreshed or restored in AM, with symptoms such as dry mouth , morning headaches , and residual fatigue. Naps are taken frequently, dozes off all the time.   lasting from 2-12 minutes and are more refreshing than nocturnal sleep. Irrestible urge to go to sleep.     REVIEW OF SYSTEMS: Out of a complete 14 system review of symptoms, the patient complains only of the following symptoms, chocking sensation at night, anxiety, wheezing, cough, and all other reviewed systems are negative.   ALLERGIES: Allergies  Allergen Reactions  . Clonopin [Clonazepam] Anaphylaxis  Swelling of the tongue and mouth.   . Fish Allergy Anaphylaxis, Shortness Of Breath and Swelling  . Flexeril [Cyclobenzaprine Hcl] Shortness Of Breath  . Ibuprofen Anaphylaxis and Hives  . Shellfish Allergy Anaphylaxis  . Ace Inhibitors Cough  . Tramadol Nausea And Vomiting    Upset stomach     HOME MEDICATIONS: Outpatient Medications Prior to Visit  Medication Sig Dispense Refill  .  albuterol (PROVENTIL HFA;VENTOLIN HFA) 108 (90 Base) MCG/ACT inhaler Inhale 2 puffs into the lungs 2 (two) times daily.    Marland Kitchen. albuterol (PROVENTIL) (2.5 MG/3ML) 0.083% nebulizer solution Take 2.5 mg by nebulization every 6 (six) hours as needed for wheezing or shortness of breath.    Marland Kitchen. amLODipine (NORVASC) 5 MG tablet Take 5 mg by mouth daily.    . budesonide-formoterol (SYMBICORT) 80-4.5 MCG/ACT inhaler Inhale 2 puffs into the lungs 2 (two) times daily. 1 each 5  . celecoxib (CELEBREX) 100 MG capsule Take 100 mg by mouth 2 (two) times daily.    . citalopram (CELEXA) 20 MG tablet Take 20 mg by mouth daily.    . cloNIDine (CATAPRES) 0.1 MG tablet Take 0.1 mg by mouth daily.    . hydrOXYzine (VISTARIL) 50 MG capsule Take 1 capsule (50 mg total) by mouth daily as needed for anxiety (Insomnia). 30 capsule 5  . pregabalin (LYRICA) 75 MG capsule Take 75 mg by mouth 2 (two) times daily.    Marland Kitchen. tiZANidine (ZANAFLEX) 2 MG tablet Take 2 mg by mouth 2 (two) times daily as needed.    . torsemide (DEMADEX) 5 MG tablet Take 5 mg by mouth daily.    . pantoprazole (PROTONIX) 40 MG tablet Take 1 tablet (40 mg total) by mouth daily. 30 tablet 1  . HYDROcodone-acetaminophen (NORCO/VICODIN) 5-325 MG tablet Take 1 tablet by mouth every 6 (six) hours as needed. (Patient not taking: No sig reported) 5 tablet 0  . methocarbamol (ROBAXIN) 500 MG tablet Take 1 tablet (500 mg total) by mouth 2 (two) times daily. (Patient not taking: No sig reported) 10 tablet 0  . nitrofurantoin, macrocrystal-monohydrate, (MACROBID) 100 MG capsule Take 100 mg by mouth 2 (two) times daily. (Patient not taking: Reported on 04/14/2020)     No facility-administered medications prior to visit.     PAST MEDICAL HISTORY: Past Medical History:  Diagnosis Date  . Anxiety   . Arthritis   . Asthma   . Chronic back pain   . COPD (chronic obstructive pulmonary disease) (HCC)    Chronic bronchitis  . Depression   . GERD (gastroesophageal reflux  disease)   . Hypertension   . Neuropathy   . Osteoarthritis of left knee, 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  . Suicidal ideation 12/24/2018     PAST SURGICAL HISTORY: Past Surgical History:  Procedure Laterality Date  . ESOPHAGOGASTRODUODENOSCOPY (EGD) WITH PROPOFOL N/A 12/25/2018   Procedure: ESOPHAGOGASTRODUODENOSCOPY (EGD) WITH PROPOFOL;  Surgeon: Malissa Hippoehman, Najeeb U, MD;  [facility];  Service: Endoscopy;  Laterality: N/A;  . PATELLA-FEMORAL ARTHROPLASTY Left 10/08/2018   Procedure: PATELLA-FEMORAL ARTHROPLASTY;  Surgeon: Teryl LucyLandau, Joshua, MD;  Location: [facility];  Service: Orthopedics;  Laterality: Left;  . TUBAL LIGATION       FAMILY HISTORY: Family History  Problem Relation Age of Onset  . Gout Paternal Grandfather   . Cirrhosis Paternal Grandfather   . Hypertension Paternal Grandmother   . Aneurysm Paternal Grandmother   . Cirrhosis Maternal Grandmother   . Cirrhosis Maternal Grandfather   . Cancer Father   . Cirrhosis  Father   . Cirrhosis Mother   . Breast cancer Sister   . Hypertension Sister   . Bronchitis Daughter   . Bronchitis Daughter   . Asthma Son   . Bronchitis Son   . Migraines Neg Hx      SOCIAL HISTORY: Social History   Socioeconomic History  . Marital status: Single    Spouse name: Not on file  . Number of children: Not on file  . Years of education: Not on file  . Highest education level: Not on file  Occupational History  . Not on file  Tobacco Use  . Smoking status: Former Smoker    Types: Cigarettes    Quit date: 2016    Years since quitting: 5.9  . Smokeless tobacco: Never Used  . Tobacco comment: wears nicotine patches  Vaping Use  . Vaping Use: Never used  Substance and Sexual Activity  . Alcohol use: Not Currently  . Drug use: Not Currently    Types: Cocaine    Comment: crack  last used 2016  . Sexual activity: Yes    Birth control/protection: Surgical    Comment: tubal  Other Topics Concern  . Not on file  Social History  Narrative   R handed    Lives with boyfriend   1 Cup of caffeine daily    Social Determinants of Health   Financial Resource Strain: Not on file  Food Insecurity: Not on file  Transportation Needs: Not on file  Physical Activity: Not on file  Stress: Not on file  Social Connections: Not on file  Intimate Partner Violence: Not on file      PHYSICAL EXAM  Vitals:   04/14/20 1157  BP: 110/77  Pulse: 66  Weight: 179 lb 6.4 oz (81.4 kg)  Height: 4\' 11"  (1.499 m)  SpO2 99% Afebrile   Body mass index is 36.23 kg/m.   Generalized: Well developed, in no acute distress  Cardiology: normal rate and rhythm, no murmur auscultated  Respiratory: inspiratory and expiratory wheezing noted in bilateral upper and lower posterior lung fields, no stridor. No obvious difficulty breathing.   Neurological examination  Mentation: Alert oriented to time, place, history taking. Follows all commands speech and language fluent Cranial nerve II-XII: Pupils were equal round reactive to light. Extraocular movements were full, visual field were full on confrontational test. Facial sensation and strength were normal. Head turning and shoulder shrug  were normal and symmetric. Motor: The motor testing reveals 5 over 5 strength of all 4 extremities. Good symmetric motor tone is noted throughout.   Gait and station: Gait is normal.     DIAGNOSTIC DATA (LABS, IMAGING, TESTING) - I reviewed patient records, labs, notes, testing and imaging myself where available.  Lab Results  Component Value Date   WBC 5.4 12/26/2018   HGB 10.6 (L) 12/26/2018   HCT 34.1 (L) 12/26/2018   MCV 92.4 12/26/2018   PLT 491 (H) 12/26/2018      Component Value Date/Time   NA 138 12/26/2018 0551   K 3.6 12/26/2018 0551   CL 107 12/26/2018 0551   CO2 24 12/26/2018 0551   GLUCOSE 90 12/26/2018 0551   BUN <5 (L) 12/26/2018 0551   CREATININE 0.73 12/26/2018 0551   CREATININE 0.74 04/20/2017 0955   CALCIUM 8.2 (L)  12/26/2018 0551   PROT 6.4 (L) 12/25/2018 0529   ALBUMIN 3.4 (L) 12/25/2018 0529   AST 26 12/25/2018 0529   ALT 28 12/25/2018 0529   ALKPHOS 62  12/25/2018 0529   BILITOT 0.7 12/25/2018 0529   GFRNONAA >60 12/26/2018 0551   GFRNONAA 96 04/20/2017 0955   GFRAA >60 12/26/2018 0551   GFRAA 111 04/20/2017 0955   Lab Results  Component Value Date   CHOL 155 04/20/2017   HDL 70 04/20/2017   LDLCALC 72 04/20/2017   TRIG 46 04/20/2017   CHOLHDL 2.2 04/20/2017   Lab Results  Component Value Date   HGBA1C 5.4 04/20/2017   Lab Results  Component Value Date   VITAMINB12 468 12/26/2018   Lab Results  Component Value Date   TSH 1.533 12/24/2018    No flowsheet data found.   ASSESSMENT AND PLAN  49 y.o. year old female  has a past medical history of Anxiety, Arthritis, Asthma, Chronic back pain, COPD (chronic obstructive pulmonary disease) (HCC), Depression, GERD (gastroesophageal reflux disease), Hypertension, Neuropathy, Osteoarthritis of left knee, patellofemoral (12/27/2017), and Suicidal ideation (12/24/2018). here with   Parasomnia, unspecified type  Excessive daytime sleepiness  Choking, initial encounter  Asthma with acute exacerbation, unspecified asthma severity, unspecified whether persistent  Gastroesophageal reflux disease, unspecified whether esophagitis present  Chronic allergic rhinitis  Kathryn [name] continues to have concerns of a choking sensation at night when she sleeps.  She reports that she often wakes up feeling as if she is strangled.  She has multiple factors that could be contributing.  She is currently complaining of a productive cough that started today.  She has a longstanding history of asthma on Symbicort, albuterol nebulizers and inhaler.  Fortunately, she does not exhibit any concerns of fever.  She denies chest pain.  She does feel that it is harder for her to get air in and out.  I have advised that she call her primary care provider as soon as she  leaves our office today to report the symptoms.  She is fully vaccinated but has not had booster vaccine.  Vital signs are stable.  She also has chronic allergies and GERD.  I have asked her to discuss these findings with primary care to ensure that they are not contributing to the sensation of choking at night.  She may consider having primary care refer her to psychiatry who can help manage sleep disorders.  I have offered to place referral today, however, her insurance requires that referral come from PCP.  She was encouraged to remain a non-smoker.  Healthy lifestyle habits reviewed.  She will follow-up with Dr. Vickey Huger in 3 to 6 months if sleep concerns have not improved.  Otherwise, she may follow-up as needed.  She verbalizes understanding and agreement with this plan.  No orders of the defined types were placed in this encounter.     I spent 30 minutes of face-to-face and non-face-to-face time with patient.  This included previsit chart review, lab review, study review, order entry, electronic health record documentation, patient education.    Shawnie Dapper, MSN, FNP-C 04/14/2020, 12:20 PM  [facility] [address] [place], [place] [address] [phone]

## 2020-04-15 ENCOUNTER — Encounter: Payer: Medicaid Other | Admitting: Nurse Practitioner

## 2020-04-15 ENCOUNTER — Other Ambulatory Visit: Payer: Self-pay

## 2020-04-15 ENCOUNTER — Encounter: Payer: Self-pay | Admitting: Nurse Practitioner

## 2020-04-15 NOTE — Progress Notes (Signed)
Acute Office Visit  Subjective:    Patient ID: Kathryn [name], female    DOB: Jan 21, 1971, 49 y.o.   MRN: [id number]  Chief Complaint  Patient presents with  . Cough    Dry cough started yesterday. If she coughs hard she will sometimes bring up a small amount of mucus. Some wheeze. No SOB    HPI Patient is in today for sick visit. Symptoms started yesterday. Called 3 times- No answer; left VM   Past Medical History:  Diagnosis Date  . Anxiety   . Arthritis   . Asthma   . Chronic back pain   . COPD (chronic obstructive pulmonary disease) (HCC)    Chronic bronchitis  . Depression   . GERD (gastroesophageal reflux disease)   . Hypertension   . Neuropathy   . Osteoarthritis of left knee, 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  . Suicidal ideation 12/24/2018    Past Surgical History:  Procedure Laterality Date  . ESOPHAGOGASTRODUODENOSCOPY (EGD) WITH PROPOFOL N/A 12/25/2018   Procedure: ESOPHAGOGASTRODUODENOSCOPY (EGD) WITH PROPOFOL;  Surgeon: Rogene Houston, MD;  [facility];  Service: Endoscopy;  Laterality: N/A;  . PATELLA-FEMORAL ARTHROPLASTY Left 10/08/2018   Procedure: PATELLA-FEMORAL ARTHROPLASTY;  Surgeon: Marchia Bond, MD;  Location: [facility];  Service: Orthopedics;  Laterality: Left;  . TUBAL LIGATION      Family History  Problem Relation Age of Onset  . Gout Paternal Grandfather   . Cirrhosis Paternal Grandfather   . Hypertension Paternal Grandmother   . Aneurysm Paternal Grandmother   . Cirrhosis Maternal Grandmother   . Cirrhosis Maternal Grandfather   . Cancer Father   . Cirrhosis Father   . Cirrhosis Mother   . Breast cancer Sister   . Hypertension Sister   . Bronchitis Daughter   . Bronchitis Daughter   . Asthma Son   . Bronchitis Son   . Migraines Neg Hx     Social History   Socioeconomic History  . Marital status: Single    Spouse name: Not on file  . Number of children: Not on file  . Years of education: Not on file  . Highest education  level: Not on file  Occupational History  . Not on file  Tobacco Use  . Smoking status: Former Smoker    Types: Cigarettes    Quit date: 2016    Years since quitting: 6.0  . Smokeless tobacco: Never Used  . Tobacco comment: wears nicotine patches  Vaping Use  . Vaping Use: Never used  Substance and Sexual Activity  . Alcohol use: Not Currently  . Drug use: Not Currently    Types: Cocaine    Comment: crack  last used 2016  . Sexual activity: Yes    Birth control/protection: Surgical    Comment: tubal  Other Topics Concern  . Not on file  Social History Narrative   R handed    Lives with boyfriend   1 Cup of caffeine daily    Social Determinants of Health   Financial Resource Strain: Not on file  Food Insecurity: Not on file  Transportation Needs: Not on file  Physical Activity: Not on file  Stress: Not on file  Social Connections: Not on file  Intimate Partner Violence: Not on file    Outpatient Medications Prior to Visit  Medication Sig Dispense Refill  . albuterol (PROVENTIL HFA;VENTOLIN HFA) 108 (90 Base) MCG/ACT inhaler Inhale 2 puffs into the lungs 2 (two) times daily.    Marland Kitchen albuterol (PROVENTIL) (2.5 MG/3ML)  0.083% nebulizer solution Take 2.5 mg by nebulization every 6 (six) hours as needed for wheezing or shortness of breath.    Marland Kitchen amLODipine (NORVASC) 5 MG tablet Take 5 mg by mouth daily.    . budesonide-formoterol (SYMBICORT) 80-4.5 MCG/ACT inhaler Inhale 2 puffs into the lungs 2 (two) times daily. 1 each 5  . celecoxib (CELEBREX) 100 MG capsule Take 100 mg by mouth 2 (two) times daily.    . citalopram (CELEXA) 20 MG tablet Take 20 mg by mouth daily.    . cloNIDine (CATAPRES) 0.1 MG tablet Take 0.1 mg by mouth daily.    . hydrOXYzine (VISTARIL) 50 MG capsule Take 1 capsule (50 mg total) by mouth daily as needed for anxiety (Insomnia). 30 capsule 5  . pregabalin (LYRICA) 75 MG capsule Take 75 mg by mouth 2 (two) times daily.    Marland Kitchen tiZANidine (ZANAFLEX) 2 MG tablet  Take 2 mg by mouth 2 (two) times daily as needed.    . torsemide (DEMADEX) 5 MG tablet Take 5 mg by mouth daily.    . pantoprazole (PROTONIX) 40 MG tablet Take 1 tablet (40 mg total) by mouth daily. 30 tablet 1   No facility-administered medications prior to visit.    Allergies  Allergen Reactions  . Clonopin [Clonazepam] Anaphylaxis    Swelling of the tongue and mouth.   . Fish Allergy Anaphylaxis, Shortness Of Breath and Swelling  . Flexeril [Cyclobenzaprine Hcl] Shortness Of Breath  . Ibuprofen Anaphylaxis and Hives  . Shellfish Allergy Anaphylaxis  . Ace Inhibitors Cough  . Tramadol Nausea And Vomiting    Upset stomach    Review of Systems     Objective:    Physical Exam  BP 100/79   Ht 4\' 11"  (1.499 m)   Wt 179 lb (81.2 kg)   BMI 36.15 kg/m  Wt Readings from Last 3 Encounters:  04/15/20 179 lb (81.2 kg)  04/14/20 179 lb 6.4 oz (81.4 kg)  03/19/20 183 lb 1.9 oz (83.1 kg)    Health Maintenance Due  Topic Date Due  . MAMMOGRAM  Never done  . COLONOSCOPY (Pts 45-33yrs Insurance coverage will need to be confirmed)  Never done    There are no preventive care reminders to display for this patient.   Lab Results  Component Value Date   TSH 1.533 12/24/2018   Lab Results  Component Value Date   WBC 5.4 12/26/2018   HGB 10.6 (L) 12/26/2018   HCT 34.1 (L) 12/26/2018   MCV 92.4 12/26/2018   PLT 491 (H) 12/26/2018   Lab Results  Component Value Date   NA 138 12/26/2018   K 3.6 12/26/2018   CO2 24 12/26/2018   GLUCOSE 90 12/26/2018   BUN <5 (L) 12/26/2018   CREATININE 0.73 12/26/2018   BILITOT 0.7 12/25/2018   ALKPHOS 62 12/25/2018   AST 26 12/25/2018   ALT 28 12/25/2018   PROT 6.4 (L) 12/25/2018   ALBUMIN 3.4 (L) 12/25/2018   CALCIUM 8.2 (L) 12/26/2018   ANIONGAP 7 12/26/2018   Lab Results  Component Value Date   CHOL 155 04/20/2017   Lab Results  Component Value Date   HDL 70 04/20/2017   Lab Results  Component Value Date   LDLCALC 72  04/20/2017   Lab Results  Component Value Date   TRIG 46 04/20/2017   Lab Results  Component Value Date   CHOLHDL 2.2 04/20/2017   Lab Results  Component Value Date   HGBA1C 5.4 04/20/2017  Assessment & Plan:   Problem List Items Addressed This Visit   None      No orders of the defined types were placed in this encounter.    Noreene Larsson, NP

## 2020-04-17 DIAGNOSIS — G4730 Sleep apnea, unspecified: Secondary | ICD-10-CM

## 2020-04-17 HISTORY — DX: Sleep apnea, unspecified: G47.30

## 2020-04-21 ENCOUNTER — Other Ambulatory Visit: Payer: Self-pay | Admitting: Neurosurgery

## 2020-04-26 ENCOUNTER — Encounter: Payer: Self-pay | Admitting: Internal Medicine

## 2020-04-26 ENCOUNTER — Telehealth: Payer: Self-pay

## 2020-04-26 ENCOUNTER — Other Ambulatory Visit: Payer: Self-pay

## 2020-04-26 ENCOUNTER — Ambulatory Visit (INDEPENDENT_AMBULATORY_CARE_PROVIDER_SITE_OTHER): Payer: Medicaid Other | Admitting: Internal Medicine

## 2020-04-26 VITALS — BP 124/79 | HR 79 | Resp 16 | Ht 59.0 in | Wt 183.1 lb

## 2020-04-26 DIAGNOSIS — M545 Low back pain, unspecified: Secondary | ICD-10-CM | POA: Diagnosis not present

## 2020-04-26 DIAGNOSIS — N938 Other specified abnormal uterine and vaginal bleeding: Secondary | ICD-10-CM | POA: Diagnosis not present

## 2020-04-26 MED ORDER — PREGABALIN 75 MG PO CAPS: 75.0000 mg | ORAL_CAPSULE | Freq: Two times a day (BID) | ORAL | 5 refills | Status: DC

## 2020-04-26 MED ORDER — MEGESTROL ACETATE 40 MG PO TABS
40.0000 mg | ORAL_TABLET | Freq: Every day | ORAL | 0 refills | Status: DC
Start: 2020-04-26 — End: 2020-06-09

## 2020-04-26 NOTE — Pre-Procedure Instructions (Signed)
[place], [place] [address] Phone: [phone] Fax: [phone]     Your procedure is scheduled on Friday, January 14 at 0730 AM- 12:18PM.  Report to [facility] Entr[facility] "A" at 05:30 A.M., and check in at the Admitting office.  Call this number if you have problems the morning of surgery:  [phone]  Call [phone] if you have any questions prior to your surgery date Monday-Friday 8am-4pm.    Remember:  Do not eat or drink after midnight the night before your surgery.     Take these medicines the morning of surgery with A SIP OF WATER: amLODipine (NORVASC) celecoxib (CELEBREX) citalopram (CELEXA) cloNIDine (CATAPRES) pantoprazole (PROTONIX) pregabalin (LYRICA)  budesonide-formoterol (SYMBICORT) inhaler  IF NEEDED: hydrOXYzine (VISTARIL) tiZANidine (ZANAFLEX) albuterol (PROVENTIL HFA;VENTOLIN HFA) inhaler- bring with you the day of surgery albuterol (PROVENTIL) nebulizer breathing treatment   As of today, STOP taking any Aspirin (unless otherwise instructed by your surgeon) Aleve, Naproxen, Ibuprofen, Motrin, Advil, Goody's, BC's, all herbal medications, fish oil, and all vitamins.                      Do not wear jewelry, make up, or nail polish.            Do not wear lotions, powders, perfumes/colognes, or deodorant.            Do not shave 48 hours prior to surgery.             Do not bring valuables to the hospital.            [facility] is not responsible for any belongings or valuables.  Do NOT Smoke (Tobacco/Vaping) or drink Alcohol 24 hours prior to your procedure.  If you use a CPAP at night, you may bring all equipment for your overnight stay.   Contacts, glasses, dentures or bridgework may not be worn into surgery.      For patients admitted to the hospital, discharge time will be determined by your treatment team.   Patients discharged the day of surgery will not be allowed to  drive home, and someone needs to stay with them for 24 hours.    Special instructions:   [facility]- Preparing For Surgery  Before surgery, you can play an important role. Because skin is not sterile, your skin needs to be as free of germs as possible. You can reduce the number of germs on your skin by washing with CHG (chlorahexidine gluconate) Soap before surgery.  CHG is an antiseptic cleaner which kills germs and bonds with the skin to continue killing germs even after washing.    Oral Hygiene is also important to reduce your risk of infection.  Remember - BRUSH YOUR TEETH THE MORNING OF SURGERY WITH YOUR REGULAR TOOTHPASTE  Please do not use if you have an allergy to CHG or antibacterial soaps. If your skin becomes reddened/irritated stop using the CHG.  Do not shave (including legs and underarms) for at least 48 hours prior to first CHG shower. It is OK to shave your face.  Please follow these instructions carefully.   1. Shower the NIGHT BEFORE SURGERY and the MORNING OF SURGERY with CHG Soap.   2. If you chose to wash your hair, wash your hair first as usual with your normal shampoo.  3. After you shampoo, rinse your hair and body thoroughly to remove the shampoo.  4. Use CHG  as you would any other liquid soap. You can apply CHG directly to the skin and wash gently with a scrungie or a clean washcloth.   5. Apply the CHG Soap to your body ONLY FROM THE NECK DOWN.  Do not use on open wounds or open sores. Avoid contact with your eyes, ears, mouth and genitals (private parts). Wash Face and genitals (private parts)  with your normal soap.   6. Wash thoroughly, paying special attention to the area where your surgery will be performed.  7. Thoroughly rinse your body with warm water from the neck down.  8. DO NOT shower/wash with your normal soap after using and rinsing off the CHG Soap.  9. Pat yourself dry with a CLEAN TOWEL.  10. Wear CLEAN PAJAMAS to bed the night before  surgery  11. Place CLEAN SHEETS on your bed the night of your first shower and DO NOT SLEEP WITH PETS.   Day of Surgery: Wear Clean/Comfortable clothing the morning of surgery Do not apply any deodorants/lotions.   Remember to brush your teeth WITH YOUR REGULAR TOOTHPASTE.   Please read over the following fact sheets that you were given.

## 2020-04-26 NOTE — Patient Instructions (Signed)
Please contact your OB/GYN for heavy menstrual cycle.  Please start taking Megestrol as prescribed and discuss it with your OB/GYN.  If you have shortness of breath or sudden onset dizziness or chest pain, please go to ER immediately.

## 2020-04-26 NOTE — Progress Notes (Signed)
Established Patient Office Visit  Subjective:  Patient ID: Kathryn [name], female    DOB: [birth year]  Age: 50 y.o. MRN: [id number]  CC:  Chief Complaint  Patient presents with  . Dizziness    Also menstrual cycle is not getting any lighter. Started Mar 29, 2020. Feeling nauseous and light headed     HPI Kathryn [name] is a 50 year old female with past medical history of hypertension, asthma, GERD, insomnia, mild sleep apnea, depression, chronic low back pain and knee pain, and obesity who presents for evaluation of heavy menstrual bleeding since last 4 weeks.  She states that her menstrual cycle is regular, which usually lasts about 5 days. She has been having bleeding for last 4 weeks, denies blood clots. She has been having dizziness for last few days. She also mentions lower abdominal discomfort for last few days. She denies any unusual discharge, fever or chills.  Past Medical History:  Diagnosis Date  . Anxiety   . Arthritis   . Asthma   . Chronic back pain   . COPD (chronic obstructive pulmonary disease) (HCC)    Chronic bronchitis  . Depression   . GERD (gastroesophageal reflux disease)   . Hypertension   . Neuropathy   . Osteoarthritis of left knee, 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  . Suicidal ideation 12/24/2018    Past Surgical History:  Procedure Laterality Date  . ESOPHAGOGASTRODUODENOSCOPY (EGD) WITH PROPOFOL N/A 12/25/2018   Procedure: ESOPHAGOGASTRODUODENOSCOPY (EGD) WITH PROPOFOL;  Surgeon: Rogene Houston, MD;  Location: [facility];  Service: Endoscopy;  Laterality: N/A;  . PATELLA-FEMORAL ARTHROPLASTY Left 10/08/2018   Procedure: PATELLA-FEMORAL ARTHROPLASTY;  Surgeon: Marchia Bond, MD;  Location: [facility];  Service: Orthopedics;  Laterality: Left;  . TUBAL LIGATION      Family History  Problem Relation Age of Onset  . Gout Paternal Grandfather   . Cirrhosis Paternal Grandfather   . Hypertension Paternal Grandmother   . Aneurysm Paternal Grandmother   .  Cirrhosis Maternal Grandmother   . Cirrhosis Maternal Grandfather   . Cancer Father   . Cirrhosis Father   . Cirrhosis Mother   . Breast cancer Sister   . Hypertension Sister   . Bronchitis Daughter   . Bronchitis Daughter   . Asthma Son   . Bronchitis Son   . Migraines Neg Hx     Social History   Socioeconomic History  . Marital status: Single    Spouse name: Not on file  . Number of children: Not on file  . Years of education: Not on file  . Highest education level: Not on file  Occupational History  . Not on file  Tobacco Use  . Smoking status: Former Smoker    Types: Cigarettes    Quit date: 2016    Years since quitting: 6.0  . Smokeless tobacco: Never Used  . Tobacco comment: wears nicotine patches  Vaping Use  . Vaping Use: Never used  Substance and Sexual Activity  . Alcohol use: Not Currently  . Drug use: Not Currently    Types: Cocaine    Comment: crack  last used 2016  . Sexual activity: Yes    Birth control/protection: Surgical    Comment: tubal  Other Topics Concern  . Not on file  Social History Narrative   R handed    Lives with boyfriend   1 Cup of caffeine daily    Social Determinants of Health   Financial Resource Strain: Not on file  Food Insecurity: Not  on file  Transportation Needs: Not on file  Physical Activity: Not on file  Stress: Not on file  Social Connections: Not on file  Intimate Partner Violence: Not on file    Outpatient Medications Prior to Visit  Medication Sig Dispense Refill  . albuterol (PROVENTIL HFA;VENTOLIN HFA) 108 (90 Base) MCG/ACT inhaler Inhale 2 puffs into the lungs 2 (two) times daily.    Marland Kitchen albuterol (PROVENTIL) (2.5 MG/3ML) 0.083% nebulizer solution Take 2.5 mg by nebulization every 6 (six) hours as needed for wheezing or shortness of breath.    Marland Kitchen amLODipine (NORVASC) 5 MG tablet Take 5 mg by mouth daily.    . budesonide-formoterol (SYMBICORT) 80-4.5 MCG/ACT inhaler Inhale 2 puffs into the lungs 2 (two)  times daily. 1 each 5  . celecoxib (CELEBREX) 100 MG capsule Take 100 mg by mouth 2 (two) times daily.    . citalopram (CELEXA) 20 MG tablet Take 20 mg by mouth daily.    . cloNIDine (CATAPRES) 0.1 MG tablet Take 0.1 mg by mouth daily.    . hydrOXYzine (VISTARIL) 50 MG capsule Take 1 capsule (50 mg total) by mouth daily as needed for anxiety (Insomnia). 30 capsule 5  . pantoprazole (PROTONIX) 40 MG tablet Take 1 tablet (40 mg total) by mouth daily. 30 tablet 1  . tiZANidine (ZANAFLEX) 2 MG tablet Take 2 mg by mouth 2 (two) times daily as needed for muscle spasms.    Marland Kitchen torsemide (DEMADEX) 5 MG tablet Take 5 mg by mouth daily.    . pregabalin (LYRICA) 75 MG capsule Take 75 mg by mouth 2 (two) times daily.     No facility-administered medications prior to visit.    Allergies  Allergen Reactions  . Clonopin [Clonazepam] Anaphylaxis    Swelling of the tongue and mouth.   . Fish Allergy Anaphylaxis, Shortness Of Breath and Swelling  . Flexeril [Cyclobenzaprine Hcl] Shortness Of Breath  . Ibuprofen Anaphylaxis and Hives  . Shellfish Allergy Anaphylaxis  . Ace Inhibitors Cough  . Tramadol Nausea And Vomiting    Upset stomach    ROS Review of Systems  Constitutional: Negative for chills and fever.  HENT: Negative for congestion, sinus pressure, sinus pain and sore throat.   Eyes: Negative for pain and discharge.  Respiratory: Negative for cough and shortness of breath.   Cardiovascular: Negative for chest pain and palpitations.  Gastrointestinal: Negative for abdominal pain, constipation, diarrhea, nausea and vomiting.  Endocrine: Negative for polydipsia and polyuria.  Genitourinary: Positive for menstrual problem, pelvic pain and vaginal bleeding. Negative for dysuria, hematuria and vaginal discharge.  Musculoskeletal: Positive for arthralgias and back pain. Negative for neck pain and neck stiffness.  Skin: Negative for rash.  Neurological: Negative for dizziness and weakness.   Psychiatric/Behavioral: Positive for dysphoric mood and sleep disturbance. Negative for agitation and behavioral problems.      Objective:    Physical Exam Vitals reviewed.  Constitutional:      General: She is not in acute distress.    Appearance: She is obese. She is not diaphoretic.  HENT:     Head: Normocephalic and atraumatic.     Nose: Nose normal.     Mouth/Throat:     Mouth: Mucous membranes are moist.  Eyes:     General: No scleral icterus.    Extraocular Movements: Extraocular movements intact.     Pupils: Pupils are equal, round, and reactive to light.  Cardiovascular:     Rate and Rhythm: Normal rate and regular rhythm.  Pulses: Normal pulses.     Heart sounds: Normal heart sounds. No murmur heard.   Pulmonary:     Breath sounds: Normal breath sounds. No wheezing or rales.  Abdominal:     Palpations: Abdomen is soft.     Tenderness: There is abdominal tenderness (Mild, lower abdominal). There is no guarding or rebound.  Musculoskeletal:     Cervical back: Neck supple. No tenderness.     Right lower leg: No edema.     Left lower leg: No edema.  Skin:    General: Skin is warm.     Findings: No rash.  Neurological:     General: No focal deficit present.     Mental Status: She is alert and oriented to person, place, and time.     Sensory: No sensory deficit.     Motor: No weakness.  Psychiatric:        Mood and Affect: Mood normal.        Behavior: Behavior normal.     BP 124/79   Pulse 79   Resp 16   Ht 4\' 11"  (1.499 m)   Wt 183 lb 1.3 oz (83 kg)   SpO2 100%   BMI 36.98 kg/m  Wt Readings from Last 3 Encounters:  04/26/20 183 lb 1.3 oz (83 kg)  04/15/20 179 lb (81.2 kg)  04/14/20 179 lb 6.4 oz (81.4 kg)     Health Maintenance Due  Topic Date Due  . MAMMOGRAM  Never done  . COLONOSCOPY (Pts 45-55yrs Insurance coverage will need to be confirmed)  Never done  . COVID-19 Vaccine (3 - Booster for Moderna series) 04/29/2020    There are no  preventive care reminders to display for this patient.  Lab Results  Component Value Date   TSH 1.533 12/24/2018   Lab Results  Component Value Date   WBC 5.4 12/26/2018   HGB 10.6 (L) 12/26/2018   HCT 34.1 (L) 12/26/2018   MCV 92.4 12/26/2018   PLT 491 (H) 12/26/2018   Lab Results  Component Value Date   NA 138 12/26/2018   K 3.6 12/26/2018   CO2 24 12/26/2018   GLUCOSE 90 12/26/2018   BUN <5 (L) 12/26/2018   CREATININE 0.73 12/26/2018   BILITOT 0.7 12/25/2018   ALKPHOS 62 12/25/2018   AST 26 12/25/2018   ALT 28 12/25/2018   PROT 6.4 (L) 12/25/2018   ALBUMIN 3.4 (L) 12/25/2018   CALCIUM 8.2 (L) 12/26/2018   ANIONGAP 7 12/26/2018   Lab Results  Component Value Date   CHOL 155 04/20/2017   Lab Results  Component Value Date   HDL 70 04/20/2017   Lab Results  Component Value Date   LDLCALC 72 04/20/2017   Lab Results  Component Value Date   TRIG 46 04/20/2017   Lab Results  Component Value Date   CHOLHDL 2.2 04/20/2017   Lab Results  Component Value Date   HGBA1C 5.4 04/20/2017      Assessment & Plan:   Problem List Items Addressed This Visit      Other   Lumbar back pain Stable for now Lyrica refilled   Relevant Medications   pregabalin (LYRICA) 75 MG capsule    Other Visit Diagnoses    Dysfunctional uterine bleeding    -  Primary Heavy menstrual cycle Megace prescribed Advised to follow up with OB/GYN Check CBC   Relevant Medications   megestrol (MEGACE) 40 MG tablet   Other Relevant Orders   CBC with Differential  Meds ordered this encounter  Medications  . pregabalin (LYRICA) 75 MG capsule    Sig: Take 1 capsule (75 mg total) by mouth 2 (two) times daily.    Dispense:  60 capsule    Refill:  5  . megestrol (MEGACE) 40 MG tablet    Sig: Take 1 tablet (40 mg total) by mouth daily.    Dispense:  15 tablet    Refill:  0    Follow-up: Return if symptoms worsen or fail to improve.    Lindell Spar, MD

## 2020-04-26 NOTE — Telephone Encounter (Signed)
Pt lvm that she need her Lyrica called in to [facility]

## 2020-04-27 ENCOUNTER — Other Ambulatory Visit (HOSPITAL_COMMUNITY): Payer: Medicaid Other

## 2020-04-27 ENCOUNTER — Inpatient Hospital Stay (HOSPITAL_COMMUNITY)
Admission: RE | Admit: 2020-04-27 | Discharge: 2020-04-27 | Disposition: A | Discharge status: Home / Self Care | Payer: Medicaid Other | Source: Ambulatory Visit

## 2020-04-27 LAB — CBC WITH DIFFERENTIAL/PLATELET
Basophils Absolute: 0 10*3/uL (ref 0.0–0.2)
Basos: 1 %
EOS (ABSOLUTE): 0.1 10*3/uL (ref 0.0–0.4)
Eos: 1 %
Hematocrit: 39.6 % (ref 34.0–46.6)
Hemoglobin: 13.1 g/dL (ref 11.1–15.9)
Immature Grans (Abs): 0 10*3/uL (ref 0.0–0.1)
Immature Granulocytes: 0 %
Lymphocytes Absolute: 3.5 10*3/uL — ABNORMAL HIGH (ref 0.7–3.1)
Lymphs: 43 %
MCH: 29.3 pg (ref 26.6–33.0)
MCHC: 33.1 g/dL (ref 31.5–35.7)
MCV: 89 fL (ref 79–97)
Monocytes Absolute: 0.6 10*3/uL (ref 0.1–0.9)
Monocytes: 8 %
Neutrophils Absolute: 3.7 10*3/uL (ref 1.4–7.0)
Neutrophils: 47 %
Platelets: 624 10*3/uL — ABNORMAL HIGH (ref 150–450)
RBC: 4.47 x10E6/uL (ref 3.77–5.28)
RDW: 13.3 % (ref 11.7–15.4)
WBC: 8 10*3/uL (ref 3.4–10.8)

## 2020-04-27 NOTE — Telephone Encounter (Signed)
This medication was sent to pharmacy  ?

## 2020-04-27 NOTE — Pre-Procedure Instructions (Addendum)
[place], [place] [address] Phone: [phone] Fax: [phone]     Your procedure is scheduled on _____________________ Report to [facility] Entrance "A" at ___________,________  and check in at the Admitting office.  Call this number if you have problems the morning of surgery:  [phone]  Call [phone] if you have any questions prior to your surgery date Monday-Friday 8am-4pm.    Remember:  Do not eat or drink after midnight the night before your surgery.     Take these medicines the morning of surgery with A SIP OF WATER: amLODipine (NORVASC) celecoxib (CELEBREX) citalopram (CELEXA) cloNIDine (CATAPRES) pantoprazole (PROTONIX) pregabalin (LYRICA)  budesonide-formoterol (SYMBICORT) inhaler  IF NEEDED: hydrOXYzine (VISTARIL) tiZANidine (ZANAFLEX) albuterol (PROVENTIL HFA;VENTOLIN HFA) inhaler- bring with you the day of surgery albuterol (PROVENTIL) nebulizer breathing treatment   As of today, STOP taking any Aspirin (unless otherwise instructed by your surgeon) Aleve, Naproxen, Ibuprofen, Motrin, Advil, Goody's, BC's, all herbal medications, fish oil, and all vitamins.                      Do not wear jewelry, make up, or nail polish.            Do not wear lotions, powders, perfumes/colognes, or deodorant.            Do not shave 48 hours prior to surgery.             Do not bring valuables to the hospital.            [facility] is not responsible for any belongings or valuables.  Do NOT Smoke (Tobacco/Vaping) or drink Alcohol 24 hours prior to your procedure.  If you use a CPAP at night, you may bring all equipment for your overnight stay.   Contacts, glasses, dentures or bridgework may not be worn into surgery.      For patients admitted to the hospital, discharge time will be determined by your treatment team.   Patients discharged the day of surgery will not be allowed to drive  home, and someone needs to stay with them for 24 hours.    Special instructions:   [place]- Preparing For Surgery  Before surgery, you can play an important role. Because skin is not sterile, your skin needs to be as free of germs as possible. You can reduce the number of germs on your skin by washing with CHG (chlorahexidine gluconate) Soap before surgery.  CHG is an antiseptic cleaner which kills germs and bonds with the skin to continue killing germs even after washing.    Oral Hygiene is also important to reduce your risk of infection.  Remember - BRUSH YOUR TEETH THE MORNING OF SURGERY WITH YOUR REGULAR TOOTHPASTE  Please do not use if you have an allergy to CHG or antibacterial soaps. If your skin becomes reddened/irritated stop using the CHG.  Do not shave (including legs and underarms) for at least 48 hours prior to first CHG shower. It is OK to shave your face.  Please follow these instructions carefully.   1. Shower the NIGHT BEFORE SURGERY and the MORNING OF SURGERY with CHG Soap.   2. If you chose to wash your hair, wash your hair first as usual with your normal shampoo.  3. After you shampoo, rinse your hair and body thoroughly to remove the shampoo.  4. Use CHG as you would any other liquid soap.  You can apply CHG directly to the skin and wash gently with a scrungie or a clean washcloth.   5. Apply the CHG Soap to your body ONLY FROM THE NECK DOWN.  Do not use on open wounds or open sores. Avoid contact with your eyes, ears, mouth and genitals (private parts). Wash Face and genitals (private parts)  with your normal soap.   6. Wash thoroughly, paying special attention to the area where your surgery will be performed.  7. Thoroughly rinse your body with warm water from the neck down.  8. DO NOT shower/wash with your normal soap after using and rinsing off the CHG Soap.  9. Pat yourself dry with a CLEAN TOWEL.  10. Wear CLEAN PAJAMAS to bed the night before  surgery  11. Place CLEAN SHEETS on your bed the night of your first shower and DO NOT SLEEP WITH PETS.   Day of Surgery: Wear Clean/Comfortable clothing the morning of surgery Do not apply any deodorants/lotions.   Remember to brush your teeth WITH YOUR REGULAR TOOTHPASTE.   Please read over the following fact sheets that you were given.

## 2020-04-29 ENCOUNTER — Ambulatory Visit (INDEPENDENT_AMBULATORY_CARE_PROVIDER_SITE_OTHER): Payer: Medicaid Other | Admitting: Obstetrics & Gynecology

## 2020-04-29 ENCOUNTER — Other Ambulatory Visit (HOSPITAL_COMMUNITY)
Admission: RE | Admit: 2020-04-29 | Discharge: 2020-04-29 | Disposition: A | Discharge status: Home / Self Care | Payer: Medicaid Other | Source: Ambulatory Visit | Attending: Obstetrics & Gynecology | Admitting: Obstetrics & Gynecology

## 2020-04-29 ENCOUNTER — Encounter: Payer: Self-pay | Admitting: Obstetrics & Gynecology

## 2020-04-29 ENCOUNTER — Other Ambulatory Visit: Payer: Self-pay

## 2020-04-29 VITALS — BP 123/79 | HR 74 | Ht 59.0 in

## 2020-04-29 DIAGNOSIS — Z01419 Encounter for gynecological examination (general) (routine) without abnormal findings: Secondary | ICD-10-CM | POA: Diagnosis not present

## 2020-04-29 DIAGNOSIS — N921 Excessive and frequent menstruation with irregular cycle: Secondary | ICD-10-CM | POA: Insufficient documentation

## 2020-04-29 DIAGNOSIS — N946 Dysmenorrhea, unspecified: Secondary | ICD-10-CM

## 2020-04-29 LAB — POCT HEMOGLOBIN: Hemoglobin: 10.8 g/dL — AB (ref 11–14.6)

## 2020-04-29 MED ORDER — MEGESTROL ACETATE 40 MG PO TABS: ORAL_TABLET | ORAL | 3 refills | Status: DC

## 2020-04-29 NOTE — Progress Notes (Signed)
Chief Complaint  Patient presents with  . Menorrhagia      50 y.o. [id number] Patient's last menstrual period was 03/29/2020. The current method of family planning is tubal ligation.  Outpatient Encounter Medications as of 04/29/2020  Medication Sig  . albuterol (PROVENTIL HFA;VENTOLIN HFA) 108 (90 Base) MCG/ACT inhaler Inhale 2 puffs into the lungs 2 (two) times daily.  Marland Kitchen albuterol (PROVENTIL) (2.5 MG/3ML) 0.083% nebulizer solution Take 2.5 mg by nebulization every 6 (six) hours as needed for wheezing or shortness of breath.  Marland Kitchen amLODipine (NORVASC) 5 MG tablet Take 5 mg by mouth daily.  . budesonide-formoterol (SYMBICORT) 80-4.5 MCG/ACT inhaler Inhale 2 puffs into the lungs 2 (two) times daily.  . celecoxib (CELEBREX) 100 MG capsule Take 100 mg by mouth 2 (two) times daily.  . citalopram (CELEXA) 20 MG tablet Take 20 mg by mouth daily.  . cloNIDine (CATAPRES) 0.1 MG tablet Take 0.1 mg by mouth daily.  . hydrOXYzine (VISTARIL) 50 MG capsule Take 1 capsule (50 mg total) by mouth daily as needed for anxiety (Insomnia).  . megestrol (MEGACE) 40 MG tablet Take 1 tablet (40 mg total) by mouth daily.  . megestrol (MEGACE) 40 MG tablet 3 tablets a day for 5 days, 2 tablets a day for 5 days then 1 tablet daily  . pregabalin (LYRICA) 75 MG capsule Take 1 capsule (75 mg total) by mouth 2 (two) times daily.  Marland Kitchen tiZANidine (ZANAFLEX) 2 MG tablet Take 2 mg by mouth 2 (two) times daily as needed for muscle spasms.  Marland Kitchen torsemide (DEMADEX) 5 MG tablet Take 5 mg by mouth daily.  . pantoprazole (PROTONIX) 40 MG tablet Take 1 tablet (40 mg total) by mouth daily.   No facility-administered encounter medications on file as of 04/29/2020.    Subjective Pt has been bleeding every day since 03/29/20 Generally 5 days of bleeding with 2 heavier days Some cramps normally This time pelvic pressure and pain Has never bled like this before Has not had gyn evaluation since her Edie Past Medical History:   Diagnosis Date  . Anxiety   . Arthritis   . Asthma   . Chronic back pain   . COPD (chronic obstructive pulmonary disease) (HCC)    Chronic bronchitis  . Depression   . GERD (gastroesophageal reflux disease)   . Hypertension   . Neuropathy   . Osteoarthritis of left knee, 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  . Suicidal ideation 12/24/2018    Past Surgical History:  Procedure Laterality Date  . ESOPHAGOGASTRODUODENOSCOPY (EGD) WITH PROPOFOL N/A 12/25/2018   Procedure: ESOPHAGOGASTRODUODENOSCOPY (EGD) WITH PROPOFOL;  Surgeon: Rogene Houston, MD;  [facility];  Service: Endoscopy;  Laterality: N/A;  . PATELLA-FEMORAL ARTHROPLASTY Left 10/08/2018   Procedure: PATELLA-FEMORAL ARTHROPLASTY;  Surgeon: Marchia Bond, MD;  Location: [facility];  Service: Orthopedics;  Laterality: Left;  . TUBAL LIGATION      OB History    Gravida  5   Para  3   Term  2   Preterm  1   AB  2   Living  3     SAB  1   IAB  1   Ectopic      Multiple      Live Births              Allergies  Allergen Reactions  . Clonopin [Clonazepam] Anaphylaxis    Swelling of the tongue and mouth.   . Fish Allergy Anaphylaxis, Shortness Of Breath  and Swelling  . Flexeril [Cyclobenzaprine Hcl] Shortness Of Breath  . Ibuprofen Anaphylaxis and Hives  . Shellfish Allergy Anaphylaxis  . Ace Inhibitors Cough  . Tramadol Nausea And Vomiting    Upset stomach    Social History   Socioeconomic History  . Marital status: Single    Spouse name: Not on file  . Number of children: Not on file  . Years of education: Not on file  . Highest education level: Not on file  Occupational History  . Not on file  Tobacco Use  . Smoking status: Former Smoker    Types: Cigarettes    Quit date: 2016    Years since quitting: 6.0  . Smokeless tobacco: Never Used  . Tobacco comment: wears nicotine patches  Vaping Use  . Vaping Use: Never used  Substance and Sexual Activity  . Alcohol use: Not Currently  . Drug  use: Not Currently    Types: Cocaine    Comment: crack  last used 2016  . Sexual activity: Yes    Birth control/protection: Surgical    Comment: tubal  Other Topics Concern  . Not on file  Social History Narrative   R handed    Lives with boyfriend   1 Cup of caffeine daily    Social Determinants of Health   Financial Resource Strain: Not on file  Food Insecurity: Not on file  Transportation Needs: Not on file  Physical Activity: Not on file  Stress: Not on file  Social Connections: Not on file    Family History  Problem Relation Age of Onset  . Gout Paternal Grandfather   . Cirrhosis Paternal Grandfather   . Hypertension Paternal Grandmother   . Aneurysm Paternal Grandmother   . Cirrhosis Maternal Grandmother   . Cirrhosis Maternal Grandfather   . Cancer Father   . Cirrhosis Father   . Cirrhosis Mother   . Breast cancer Sister   . Hypertension Sister   . Bronchitis Daughter   . Bronchitis Daughter   . Asthma Son   . Bronchitis Son   . Migraines Neg Hx     Medications:       Current Outpatient Medications:  .  albuterol (PROVENTIL HFA;VENTOLIN HFA) 108 (90 Base) MCG/ACT inhaler, Inhale 2 puffs into the lungs 2 (two) times daily., Disp: , Rfl:  .  albuterol (PROVENTIL) (2.5 MG/3ML) 0.083% nebulizer solution, Take 2.5 mg by nebulization every 6 (six) hours as needed for wheezing or shortness of breath., Disp: , Rfl:  .  amLODipine (NORVASC) 5 MG tablet, Take 5 mg by mouth daily., Disp: , Rfl:  .  budesonide-formoterol (SYMBICORT) 80-4.5 MCG/ACT inhaler, Inhale 2 puffs into the lungs 2 (two) times daily., Disp: 1 each, Rfl: 5 .  celecoxib (CELEBREX) 100 MG capsule, Take 100 mg by mouth 2 (two) times daily., Disp: , Rfl:  .  citalopram (CELEXA) 20 MG tablet, Take 20 mg by mouth daily., Disp: , Rfl:  .  cloNIDine (CATAPRES) 0.1 MG tablet, Take 0.1 mg by mouth daily., Disp: , Rfl:  .  hydrOXYzine (VISTARIL) 50 MG capsule, Take 1 capsule (50 mg total) by mouth daily as  needed for anxiety (Insomnia)., Disp: 30 capsule, Rfl: 5 .  megestrol (MEGACE) 40 MG tablet, Take 1 tablet (40 mg total) by mouth daily., Disp: 15 tablet, Rfl: 0 .  megestrol (MEGACE) 40 MG tablet, 3 tablets a day for 5 days, 2 tablets a day for 5 days then 1 tablet daily, Disp: 45 tablet, Rfl:  3 .  pregabalin (LYRICA) 75 MG capsule, Take 1 capsule (75 mg total) by mouth 2 (two) times daily., Disp: 60 capsule, Rfl: 5 .  tiZANidine (ZANAFLEX) 2 MG tablet, Take 2 mg by mouth 2 (two) times daily as needed for muscle spasms., Disp: , Rfl:  .  torsemide (DEMADEX) 5 MG tablet, Take 5 mg by mouth daily., Disp: , Rfl:  .  pantoprazole (PROTONIX) 40 MG tablet, Take 1 tablet (40 mg total) by mouth daily., Disp: 30 tablet, Rfl: 1  Objective Blood pressure 123/79, pulse 74, height 4\' 11"  (1.499 m), last menstrual period 03/29/2020.  General WDWN female NAD Vulva:  normal appearing vulva with no masses, tenderness or lesions Vagina:  normal mucosa, no discharge Cervix:  Normal no lesions Pap done Uterus:  10 week size, contour, position, consistency, mobility, non-tender Adnexa: ovaries:present,  normal adnexa in size, nontender and no masses   Pertinent ROS No burning with urination, frequency or urgency No nausea, vomiting or diarrhea Nor fever chills or other constitutional symptoms   Labs or studies Hemoglobin 10.8    Impression Diagnoses this Encounter::   ICD-10-CM   1. Menometrorrhagia  N92.1 US PELVIS TRANSVAGINAL NON-OB (TV ONLY)    US PELVIS (TRANSABDOMINAL ONLY)  2. Menorrhagia with irregular cycle  N92.1 POCT hemoglobin    Cytology - PAP( [place])    US PELVIS TRANSVAGINAL NON-OB (TV ONLY)    US PELVIS (TRANSABDOMINAL ONLY)  3. Encounter for gynecological examination with Papanicolaou smear of cervix  Z01.419 Cytology - PAP( [place])  4. Dysmenorrhea  N94.6 US PELVIS TRANSVAGINAL NON-OB (TV ONLY)    US PELVIS (TRANSABDOMINAL ONLY)    Established relevant  diagnosis(es):   Plan/Recommendations: Meds ordered this encounter  Medications  . megestrol (MEGACE) 40 MG tablet    Sig: 3 tablets a day for 5 days, 2 tablets a day for 5 days then 1 tablet daily    Dispense:  45 tablet    Refill:  3    Labs or Scans Ordered: Orders Placed This Encounter  Procedures  . US PELVIS TRANSVAGINAL NON-OB (TV ONLY)  . US PELVIS (TRANSABDOMINAL ONLY)  . POCT hemoglobin    Management:: Megestrol high dose algorithm with taper to stabilize dys synchronous endometrium Sonogram 2 weeks  Will assess her megestrol response and the sonogram findings and base her longer term management on those findings  Follow up Return in about 2 weeks (around 05/13/2020) for GYN sono, Follow up, with Dr Elonda Husky.        All questions were answered.

## 2020-05-04 LAB — CYTOLOGY - PAP
Chlamydia: NEGATIVE
Comment: NEGATIVE
Comment: NEGATIVE
Comment: NORMAL
Diagnosis: NEGATIVE
Diagnosis: REACTIVE
High risk HPV: NEGATIVE
Neisseria Gonorrhea: NEGATIVE

## 2020-05-07 ENCOUNTER — Telehealth: Payer: Self-pay | Admitting: Obstetrics & Gynecology

## 2020-05-07 ENCOUNTER — Telehealth: Payer: Self-pay | Admitting: *Deleted

## 2020-05-07 NOTE — Telephone Encounter (Signed)
Patient came in to office.  States bleeding slowed down some but has since gotten heavy again.  She is having cramping as well.  States she is taking Megace 1 tablet daily.  Spoke to K[facility] and advised for patient to start taking 3 tablets daily until she sees Dr Elonda Husky on 1/27.  Pt verbalized understanding and agreeable to do so.

## 2020-05-07 NOTE — Telephone Encounter (Signed)
Pateint called stating that she seen Dr. Elonda Husky not to long ago and he prescribed her something to stop her bleeding but she still has not stopped bleeding. Pt states she needs a call back ASAP. Pt states she can not take it anymore. Please contact pt

## 2020-05-12 DIAGNOSIS — M25562 Pain in left knee: Secondary | ICD-10-CM | POA: Diagnosis not present

## 2020-05-12 DIAGNOSIS — M25561 Pain in right knee: Secondary | ICD-10-CM | POA: Diagnosis not present

## 2020-05-13 ENCOUNTER — Encounter: Payer: Self-pay | Admitting: Obstetrics & Gynecology

## 2020-05-13 ENCOUNTER — Ambulatory Visit (INDEPENDENT_AMBULATORY_CARE_PROVIDER_SITE_OTHER): Payer: Medicaid Other | Admitting: Obstetrics & Gynecology

## 2020-05-13 ENCOUNTER — Ambulatory Visit (INDEPENDENT_AMBULATORY_CARE_PROVIDER_SITE_OTHER): Payer: Medicaid Other

## 2020-05-13 ENCOUNTER — Other Ambulatory Visit: Payer: Self-pay

## 2020-05-13 VITALS — BP 145/88 | HR 78 | Wt 188.0 lb

## 2020-05-13 DIAGNOSIS — N946 Dysmenorrhea, unspecified: Secondary | ICD-10-CM

## 2020-05-13 DIAGNOSIS — N921 Excessive and frequent menstruation with irregular cycle: Secondary | ICD-10-CM

## 2020-05-13 NOTE — Progress Notes (Signed)
PELVIC US TA/TV: heterogeneous anteverted uterus with multiple fibroids,(#1)posterior subserosal fibroid 4.1 x 3.3 x 2.6 cm,(#2) anterior subserosal fibroid 3 x 2.2 x 2.9 cm,(#3) fundal intramural fibroid 2 x 1.4 x 1.8 cm,EEC 10.4 mm,normal ovaries,ovaries appear mobile,no free fluid,multiple small nabothian cysts,largest complex nabothian cyst 1.2 cm

## 2020-05-13 NOTE — Progress Notes (Signed)
Preoperative History and Physical  Kathryn [name] is a 50 y.o. [phone] with Patient's last menstrual period was 04/29/2020 (exact date). admitted for a hysteroscopy uterine curettage Minerva endometrial ablation 06/09/20.  Pt has had poor response to megestrol No submucosal fibroids, small subserosal fibroids so should be a good candidate for the ablation and patient wants to proceed with that  PMH:    Past Medical History:  Diagnosis Date  . Anxiety   . Arthritis   . Asthma   . Chronic back pain   . COPD (chronic obstructive pulmonary disease) (HCC)    Chronic bronchitis  . Depression   . GERD (gastroesophageal reflux disease)   . Hypertension   . Neuropathy   . Osteoarthritis of left knee, 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  . Suicidal ideation 12/24/2018    PSH:     Past Surgical History:  Procedure Laterality Date  . ESOPHAGOGASTRODUODENOSCOPY (EGD) WITH PROPOFOL N/A 12/25/2018   Procedure: ESOPHAGOGASTRODUODENOSCOPY (EGD) WITH PROPOFOL;  Surgeon: Rogene Houston, MD;  [facility];  Service: Endoscopy;  Laterality: N/A;  . PATELLA-FEMORAL ARTHROPLASTY Left 10/08/2018   Procedure: PATELLA-FEMORAL ARTHROPLASTY;  Surgeon: Marchia Bond, MD;  Location: [facility];  Service: Orthopedics;  Laterality: Left;  . TUBAL LIGATION      POb/GynH:      OB History    Gravida  5   Para  3   Term  2   Preterm  1   AB  2   Living  3     SAB  1   IAB  1   Ectopic      Multiple      Live Births              SH:   Social History   Tobacco Use  . Smoking status: Former Smoker    Types: Cigarettes    Quit date: 2016    Years since quitting: 6.0  . Smokeless tobacco: Never Used  . Tobacco comment: wears nicotine patches  Vaping Use  . Vaping Use: Never used  Substance Use Topics  . Alcohol use: Not Currently  . Drug use: Not Currently    Types: Cocaine    Comment: crack  last used 2016    FH:    Family History  Problem Relation Age of Onset  . Gout Paternal  Grandfather   . Cirrhosis Paternal Grandfather   . Hypertension Paternal Grandmother   . Aneurysm Paternal Grandmother   . Cirrhosis Maternal Grandmother   . Cirrhosis Maternal Grandfather   . Cancer Father   . Cirrhosis Father   . Cirrhosis Mother   . Breast cancer Sister   . Hypertension Sister   . Bronchitis Daughter   . Bronchitis Daughter   . Asthma Son   . Bronchitis Son   . Migraines Neg Hx      Allergies:  Allergies  Allergen Reactions  . Clonopin [Clonazepam] Anaphylaxis    Swelling of the tongue and mouth.   . Fish Allergy Anaphylaxis, Shortness Of Breath and Swelling  . Flexeril [Cyclobenzaprine Hcl] Shortness Of Breath  . Ibuprofen Anaphylaxis and Hives  . Shellfish Allergy Anaphylaxis  . Ace Inhibitors Cough  . Tramadol Nausea And Vomiting    Upset stomach    Medications:       Current Outpatient Medications:  .  albuterol (PROVENTIL HFA;VENTOLIN HFA) 108 (90 Base) MCG/ACT inhaler, Inhale 2 puffs into the lungs 2 (two) times daily., Disp: , Rfl:  .  albuterol (PROVENTIL) (  2.5 MG/3ML) 0.083% nebulizer solution, Take 2.5 mg by nebulization every 6 (six) hours as needed for wheezing or shortness of breath., Disp: , Rfl:  .  amLODipine (NORVASC) 5 MG tablet, Take 5 mg by mouth daily., Disp: , Rfl:  .  budesonide-formoterol (SYMBICORT) 80-4.5 MCG/ACT inhaler, Inhale 2 puffs into the lungs 2 (two) times daily., Disp: 1 each, Rfl: 5 .  celecoxib (CELEBREX) 100 MG capsule, Take 100 mg by mouth 2 (two) times daily., Disp: , Rfl:  .  citalopram (CELEXA) 20 MG tablet, Take 20 mg by mouth daily., Disp: , Rfl:  .  cloNIDine (CATAPRES) 0.1 MG tablet, Take 0.1 mg by mouth daily., Disp: , Rfl:  .  HYDROcodone-acetaminophen (NORCO/VICODIN) 5-325 MG tablet, Take 1 tablet by mouth 3 (three) times daily as needed., Disp: , Rfl:  .  hydrOXYzine (VISTARIL) 50 MG capsule, Take 1 capsule (50 mg total) by mouth daily as needed for anxiety (Insomnia)., Disp: 30 capsule, Rfl: 5 .   megestrol (MEGACE) 40 MG tablet, Take 1 tablet (40 mg total) by mouth daily., Disp: 15 tablet, Rfl: 0 .  omeprazole (PRILOSEC) 20 MG capsule, Take 20 mg by mouth daily., Disp: , Rfl:  .  pregabalin (LYRICA) 75 MG capsule, Take 1 capsule (75 mg total) by mouth 2 (two) times daily., Disp: 60 capsule, Rfl: 5 .  tiZANidine (ZANAFLEX) 2 MG tablet, Take 2 mg by mouth 2 (two) times daily as needed for muscle spasms., Disp: , Rfl:  .  torsemide (DEMADEX) 5 MG tablet, Take 5 mg by mouth daily., Disp: , Rfl:   Review of Systems:   Review of Systems  Constitutional: Negative for fever, chills, weight loss, malaise/fatigue and diaphoresis.  HENT: Negative for hearing loss, ear pain, nosebleeds, congestion, sore throat, neck pain, tinnitus and ear discharge.   Eyes: Negative for blurred vision, double vision, photophobia, pain, discharge and redness.  Respiratory: Negative for cough, hemoptysis, sputum production, shortness of breath, wheezing and stridor.   Cardiovascular: Negative for chest pain, palpitations, orthopnea, claudication, leg swelling and PND.  Gastrointestinal: Positive for abdominal pain. Negative for heartburn, nausea, vomiting, diarrhea, constipation, blood in stool and melena.  Genitourinary: Negative for dysuria, urgency, frequency, hematuria and flank pain.  Musculoskeletal: Negative for myalgias, back pain, joint pain and falls.  Skin: Negative for itching and rash.  Neurological: Negative for dizziness, tingling, tremors, sensory change, speech change, focal weakness, seizures, loss of consciousness, weakness and headaches.  Endo/Heme/Allergies: Negative for environmental allergies and polydipsia. Does not bruise/bleed easily.  Psychiatric/Behavioral: Negative for depression, suicidal ideas, hallucinations, memory loss and substance abuse. The patient is not nervous/anxious and does not have insomnia.      PHYSICAL EXAM:  Blood pressure (!) 145/88, pulse 78, weight 188 lb (85.3  kg), last menstrual period 04/29/2020.    Vitals reviewed. Constitutional: She is oriented to person, place, and time. She appears well-developed and well-nourished.  HENT:  Head: Normocephalic and atraumatic.  Right Ear: External ear normal.  Left Ear: External ear normal.  Nose: Nose normal.  Mouth/Throat: Oropharynx is clear and moist.  Eyes: Conjunctivae and EOM are normal. Pupils are equal, round, and reactive to light. Right eye exhibits no discharge. Left eye exhibits no discharge. No scleral icterus.  Neck: Normal range of motion. Neck supple. No tracheal deviation present. No thyromegaly present.  Cardiovascular: Normal rate, regular rhythm, normal heart sounds and intact distal pulses.  Exam reveals no gallop and no friction rub.   No murmur heard. Respiratory: Effort normal  and breath sounds normal. No respiratory distress. She has no wheezes. She has no rales. She exhibits no tenderness.  GI: Soft. Bowel sounds are normal. She exhibits no distension and no mass. There is tenderness. There is no rebound and no guarding.  Genitourinary:       Vulva is normal without lesions Vagina is pink moist without discharge Cervix normal in appearance and pap is normal Uterus is normal size, contour, position, consistency, mobility, non-tender Adnexa is negative with normal sized ovaries by sonogram  Musculoskeletal: Normal range of motion. She exhibits no edema and no tenderness.  Neurological: She is alert and oriented to person, place, and time. She has normal reflexes. She displays normal reflexes. No cranial nerve deficit. She exhibits normal muscle tone. Coordination normal.  Skin: Skin is warm and dry. No rash noted. No erythema. No pallor.  Psychiatric: She has a normal mood and affect. Her behavior is normal. Judgment and thought content normal.    Labs: No results found for this or any previous visit (from the past 336 hour(s)).  EKG: Orders placed or performed during the  hospital encounter of 12/24/18  . EKG 12-Lead  . EKG 12-Lead  . EKG    Imaging Studies: US PELVIS TRANSVAGINAL NON-OB (TV ONLY)  Result Date: 05/13/2020  [facility] [place] [address] [place] [address]                                                                                                                                   GYNECOLOGIC SONOGRAM KAIJA KOVACEVIC is a 50 y.o. [id number] LMP 03/29/2020,She is here for a pelvic sonogram for menorrhagia,dysmenorrhea . Uterus                      9.4 x 5.5 x 6.6 cm, Total uterine volume 176 cc, heterogeneous anteverted uterus with multiple fibroids,(#1)posterior subserosal fibroid 4.1 x 3.3 x 2.6 cm,(#2) anterior subserosal fibroid 3 x 2.2 x 2.9 cm,(#3) fundal intramural fibroid 2 x 1.4 x 1.8 cm Endometrium          10.4 mm, symmetrical, wnl Right ovary             2.5 x 2.8 x 1.8 cm, wnl Left ovary                2.1 x 1.7 x 2.3 cm, wnl No free fluid Technician Comments: PELVIC US TA/TV: heterogeneous anteverted uterus with multiple fibroids,(#1)posterior subserosal fibroid 4.1 x 3.3 x 2.6 cm,(#2) anterior subserosal fibroid 3 x 2.2 x 2.9 cm,(#3) fundal intramural fibroid 2 x 1.4 x 1.8 cm,EEC 10.4 mm,normal ovaries,ovaries appear mobile,no free fluid,multiple small nabothian cysts,largest complex nabothian cyst 1.2 cm Chaperone [address] Heide Guile 05/13/2020 3:15 PM Clinical Impression and recommendations: I have reviewed the sonogram results above, combined with the patient's current clinical course, below are my impressions and any appropriate recommendations  for management based on the sonographic findings. Uterus mildly enlarge with small fibroids present, no submucosal Endometrium is normal Both ovaries are normal Florian Buff 05/13/2020 4:26 PM   US PELVIS (TRANSABDOMINAL ONLY)  Result Date: 05/13/2020  [facility] [place] [address] [place] [address]                                                                                                                                    GYNECOLOGIC SONOGRAM CHARLEEN SALSBERRY is a 50 y.o. [id number] LMP 03/29/2020,She is here for a pelvic sonogram for menorrhagia,dysmenorrhea . Uterus                      9.4 x 5.5 x 6.6 cm, Total uterine volume 176 cc, heterogeneous anteverted uterus with multiple fibroids,(#1)posterior subserosal fibroid 4.1 x 3.3 x 2.6 cm,(#2) anterior subserosal fibroid 3 x 2.2 x 2.9 cm,(#3) fundal intramural fibroid 2 x 1.4 x 1.8 cm Endometrium          10.4 mm, symmetrical, wnl Right ovary             2.5 x 2.8 x 1.8 cm, wnl Left ovary                2.1 x 1.7 x 2.3 cm, wnl No free fluid Technician Comments: PELVIC US TA/TV: heterogeneous anteverted uterus with multiple fibroids,(#1)posterior subserosal fibroid 4.1 x 3.3 x 2.6 cm,(#2) anterior subserosal fibroid 3 x 2.2 x 2.9 cm,(#3) fundal intramural fibroid 2 x 1.4 x 1.8 cm,EEC 10.4 mm,normal ovaries,ovaries appear mobile,no free fluid,multiple small nabothian cysts,largest complex nabothian cyst 1.2 cm Chaperone [address] Heide Guile 05/13/2020 3:15 PM Clinical Impression and recommendations: I have reviewed the sonogram results above, combined with the patient's current clinical course, below are my impressions and any appropriate recommendations for management based on the sonographic findings. Uterus mildly enlarge with small fibroids present, no submucosal Endometrium is normal Both ovaries are normal Florian Buff 05/13/2020 4:26 PM      Assessment: Menometrorrhagia Dysmenorrhea Small fibroids, not sub mucosal Poor response to megestrol  Plan: Hysteroscopy uterine curettage Minerva endometrial ablation 06/09/20  Florian Buff 05/13/2020 4:27 PM

## 2020-05-14 ENCOUNTER — Ambulatory Visit: Payer: Medicaid Other

## 2020-05-19 DIAGNOSIS — F419 Anxiety disorder, unspecified: Secondary | ICD-10-CM | POA: Diagnosis not present

## 2020-05-21 ENCOUNTER — Other Ambulatory Visit (HOSPITAL_COMMUNITY): Payer: Medicaid Other

## 2020-05-21 ENCOUNTER — Inpatient Hospital Stay (HOSPITAL_COMMUNITY): Admission: RE | Admit: 2020-05-21 | Payer: Medicaid Other | Source: Ambulatory Visit

## 2020-05-25 ENCOUNTER — Inpatient Hospital Stay (HOSPITAL_COMMUNITY): Admission: RE | Admit: 2020-05-25 | Payer: Medicaid Other | Source: Home / Self Care | Admitting: Neurosurgery

## 2020-05-25 ENCOUNTER — Encounter (HOSPITAL_COMMUNITY): Admission: RE | Payer: Self-pay | Source: Home / Self Care

## 2020-05-25 ENCOUNTER — Other Ambulatory Visit: Payer: Self-pay | Admitting: Internal Medicine

## 2020-05-25 DIAGNOSIS — Z1231 Encounter for screening mammogram for malignant neoplasm of breast: Secondary | ICD-10-CM

## 2020-05-25 SURGERY — POSTERIOR LUMBAR FUSION 2 LEVEL
Anesthesia: General

## 2020-05-26 ENCOUNTER — Telehealth: Payer: Self-pay

## 2020-05-26 ENCOUNTER — Other Ambulatory Visit: Payer: Self-pay | Admitting: *Deleted

## 2020-05-26 DIAGNOSIS — F4310 Post-traumatic stress disorder, unspecified: Secondary | ICD-10-CM | POA: Diagnosis not present

## 2020-05-26 MED ORDER — CELECOXIB 100 MG PO CAPS: 100.0000 mg | ORAL_CAPSULE | Freq: Two times a day (BID) | ORAL | 0 refills | Status: DC

## 2020-05-26 NOTE — Telephone Encounter (Signed)
They tried to reschedule her it looks like. The order is in it just needs to be scheduled

## 2020-05-26 NOTE — Telephone Encounter (Signed)
Please call in her celebex to [facility]

## 2020-05-26 NOTE — Telephone Encounter (Signed)
Please call the pt regarding a updated order for a Mammogram

## 2020-05-26 NOTE — Telephone Encounter (Signed)
Medication sent to pt pharmacy 

## 2020-05-27 NOTE — Telephone Encounter (Signed)
Pt is aware to call and make an appointment

## 2020-05-28 ENCOUNTER — Inpatient Hospital Stay (HOSPITAL_COMMUNITY): Admission: RE | Admit: 2020-05-28 | Payer: Medicaid Other | Source: Ambulatory Visit

## 2020-05-28 ENCOUNTER — Other Ambulatory Visit: Payer: Self-pay

## 2020-05-28 ENCOUNTER — Emergency Department (HOSPITAL_COMMUNITY)
Admission: EM | Admit: 2020-05-28 | Discharge: 2020-05-28 | Disposition: A | Discharge status: Home / Self Care | Payer: Medicaid Other | Attending: Emergency Medicine | Admitting: Emergency Medicine

## 2020-05-28 ENCOUNTER — Encounter (HOSPITAL_COMMUNITY): Payer: Self-pay | Admitting: Emergency Medicine

## 2020-05-28 DIAGNOSIS — I10 Essential (primary) hypertension: Secondary | ICD-10-CM | POA: Insufficient documentation

## 2020-05-28 DIAGNOSIS — N939 Abnormal uterine and vaginal bleeding, unspecified: Secondary | ICD-10-CM | POA: Diagnosis not present

## 2020-05-28 DIAGNOSIS — J449 Chronic obstructive pulmonary disease, unspecified: Secondary | ICD-10-CM | POA: Insufficient documentation

## 2020-05-28 DIAGNOSIS — Z7951 Long term (current) use of inhaled steroids: Secondary | ICD-10-CM | POA: Insufficient documentation

## 2020-05-28 DIAGNOSIS — J4530 Mild persistent asthma, uncomplicated: Secondary | ICD-10-CM | POA: Insufficient documentation

## 2020-05-28 DIAGNOSIS — N760 Acute vaginitis: Secondary | ICD-10-CM | POA: Insufficient documentation

## 2020-05-28 DIAGNOSIS — R102 Pelvic and perineal pain: Secondary | ICD-10-CM | POA: Diagnosis not present

## 2020-05-28 DIAGNOSIS — B9689 Other specified bacterial agents as the cause of diseases classified elsewhere: Secondary | ICD-10-CM | POA: Diagnosis not present

## 2020-05-28 DIAGNOSIS — Z79899 Other long term (current) drug therapy: Secondary | ICD-10-CM | POA: Insufficient documentation

## 2020-05-28 DIAGNOSIS — Z96652 Presence of left artificial knee joint: Secondary | ICD-10-CM | POA: Insufficient documentation

## 2020-05-28 LAB — CBC WITH DIFFERENTIAL/PLATELET
Abs Immature Granulocytes: 0.05 10*3/uL (ref 0.00–0.07)
Basophils Absolute: 0 10*3/uL (ref 0.0–0.1)
Basophils Relative: 0 %
Eosinophils Absolute: 0.1 10*3/uL (ref 0.0–0.5)
Eosinophils Relative: 1 %
HCT: 38.1 % (ref 36.0–46.0)
Hemoglobin: 12.1 g/dL (ref 12.0–15.0)
Immature Granulocytes: 1 %
Lymphocytes Relative: 34 %
Lymphs Abs: 3.5 10*3/uL (ref 0.7–4.0)
MCH: 29.4 pg (ref 26.0–34.0)
MCHC: 31.8 g/dL (ref 30.0–36.0)
MCV: 92.5 fL (ref 80.0–100.0)
Monocytes Absolute: 0.8 10*3/uL (ref 0.1–1.0)
Monocytes Relative: 8 %
Neutro Abs: 5.7 10*3/uL (ref 1.7–7.7)
Neutrophils Relative %: 56 %
Platelets: 597 10*3/uL — ABNORMAL HIGH (ref 150–400)
RBC: 4.12 MIL/uL (ref 3.87–5.11)
RDW: 14.4 % (ref 11.5–15.5)
WBC: 10.1 10*3/uL (ref 4.0–10.5)
nRBC: 0 % (ref 0.0–0.2)

## 2020-05-28 LAB — URINALYSIS, ROUTINE W REFLEX MICROSCOPIC
Bacteria, UA: NONE SEEN
Bilirubin Urine: NEGATIVE
Glucose, UA: NEGATIVE mg/dL
Ketones, ur: NEGATIVE mg/dL
Leukocytes,Ua: NEGATIVE
Nitrite: NEGATIVE
Protein, ur: NEGATIVE mg/dL
Specific Gravity, Urine: 1.024 (ref 1.005–1.030)
pH: 6 (ref 5.0–8.0)

## 2020-05-28 LAB — BASIC METABOLIC PANEL
Anion gap: 7 (ref 5–15)
BUN: 18 mg/dL (ref 6–20)
CO2: 26 mmol/L (ref 22–32)
Calcium: 9.3 mg/dL (ref 8.9–10.3)
Chloride: 104 mmol/L (ref 98–111)
Creatinine, Ser: 0.73 mg/dL (ref 0.44–1.00)
GFR, Estimated: >60 mL/min (ref >60)
Glucose, Bld: 96 mg/dL (ref 70–99)
Potassium: 4.2 mmol/L (ref 3.5–5.1)
Sodium: 137 mmol/L (ref 135–145)

## 2020-05-28 LAB — WET PREP, GENITAL
Sperm: NONE SEEN
Trich, Wet Prep: NONE SEEN
Yeast Wet Prep HPF POC: NONE SEEN

## 2020-05-28 MED ORDER — ONDANSETRON 4 MG PO TBDP
4.0000 mg | ORAL_TABLET | Freq: Once | ORAL | Status: AC
  Administered 2020-05-28: 4 mg via ORAL
  Filled 2020-05-28: qty 1

## 2020-05-28 MED ORDER — MORPHINE SULFATE (PF) 10 MG/ML IV SOLN
10.0000 mg | Freq: Once | INTRAVENOUS | Status: DC
  Filled 2020-05-28: qty 1

## 2020-05-28 MED ORDER — FENTANYL CITRATE (PF) 100 MCG/2ML IJ SOLN
50.0000 ug | Freq: Once | INTRAMUSCULAR | Status: DC
  Filled 2020-05-28: qty 2

## 2020-05-28 MED ORDER — MORPHINE SULFATE (PF) 10 MG/ML IV SOLN
10.0000 mg | Freq: Once | INTRAVENOUS | Status: AC
  Administered 2020-05-28: 10 mg via INTRAMUSCULAR
  Filled 2020-05-28: qty 1

## 2020-05-28 MED ORDER — ONDANSETRON HCL 4 MG/2ML IJ SOLN
4.0000 mg | Freq: Once | INTRAMUSCULAR | Status: DC
  Filled 2020-05-28: qty 2

## 2020-05-28 MED ORDER — CELECOXIB 200 MG PO CAPS: 200.0000 mg | ORAL_CAPSULE | Freq: Two times a day (BID) | ORAL | 0 refills | Status: DC

## 2020-05-28 MED ORDER — BORIC ACID VAGINAL 600 MG VA SUPP: 600.0000 mg | VAGINAL | 0 refills | Status: DC

## 2020-05-28 NOTE — ED Triage Notes (Signed)
Pt reports vaginal bleeding, pain, and odor. Pt reports she started her menstrual cycle on 03/29/2020 and has has vaginal bleeding since that time. Pt states recently she has been dizzy and lightheaded. Pt reports syncopal episodes.

## 2020-05-28 NOTE — ED Provider Notes (Addendum)
[facility] Provider Note   CSN: [id number] Arrival date & time: 05/28/20  2229     History Chief Complaint  Patient presents with  . Vaginal Bleeding    Kathryn [name] is a 50 y.o. female here for vaginal bleeding.  Bleeding since Dec. 13, 21. She is followed by Dr. Elonda Husky and is scheduled for a D/c with ablation on the 23rd of February. She says that she is week and has passed out, she has had weakness with standing and says that she is changing her pad every 15 minutes. She complains of foul odor. She had a pelvic US on 05/13/20 that showed multiple fibroids.  The history is provided by the patient and medical records. No language interpreter was used.       Past Medical History:  Diagnosis Date  . Anxiety   . Arthritis   . Asthma   . Chronic back pain   . COPD (chronic obstructive pulmonary disease) (HCC)    Chronic bronchitis  . Depression   . GERD (gastroesophageal reflux disease)   . Hypertension   . Neuropathy   . Osteoarthritis of left knee, 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  . Suicidal ideation 12/24/2018    Patient Active Problem List   Diagnosis Date Noted  . URI (upper respiratory infection) 04/15/2020  . Insomnia 03/19/2020  . Mild persistent asthma without complication 79/89/2119  . Need for immunization against influenza 03/19/2020  . Obesity (BMI 30-39.9) 03/19/2020  . GERD (gastroesophageal reflux disease) 03/19/2020  . Intermittent palpitations 12/17/2019  . Snoring 12/17/2019  . Excessive daytime sleepiness 12/17/2019  . Choking 12/17/2019  . 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 Parasomnia 12/17/2019  . Enlarged uterus 02/20/2018  . Screening for colorectal cancer 02/20/2018  . Routine cervical smear 02/20/2018  . Encounter to establish care 02/20/2018  . Osteoarthritis of left knee, 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  . MDD (major depressive disorder), recurrent episode, severe ([place]) 12/24/2017  . Lumbar back pain  05/11/2017  . Low vitamin B12 level 05/11/2017  . Primary hypertension 01/11/2017    Past Surgical History:  Procedure Laterality Date  . ESOPHAGOGASTRODUODENOSCOPY (EGD) WITH PROPOFOL N/A 12/25/2018   Procedure: ESOPHAGOGASTRODUODENOSCOPY (EGD) WITH PROPOFOL;  Surgeon: Rogene Houston, MD;  [facility];  Service: Endoscopy;  Laterality: N/A;  . PATELLA-FEMORAL ARTHROPLASTY Left 10/08/2018   Procedure: PATELLA-FEMORAL ARTHROPLASTY;  Surgeon: Marchia Bond, MD;  Location: [facility];  Service: Orthopedics;  Laterality: Left;  . TUBAL LIGATION       OB History    Gravida  5   Para  3   Term  2   Preterm  1   AB  2   Living  3     SAB  1   IAB  1   Ectopic      Multiple      Live Births              Family History  Problem Relation Age of Onset  . Gout Paternal Grandfather   . Cirrhosis Paternal Grandfather   . Hypertension Paternal Grandmother   . Aneurysm Paternal Grandmother   . Cirrhosis Maternal Grandmother   . Cirrhosis Maternal Grandfather   . Cancer Father   . Cirrhosis Father   . Cirrhosis Mother   . Breast cancer Sister   . Hypertension Sister   . Bronchitis Daughter   . Bronchitis Daughter   . Asthma Son   . Bronchitis Son   . Migraines Neg Hx  Social History   Tobacco Use  . Smoking status: Former Smoker    Types: Cigarettes    Quit date: 2016    Years since quitting: 6.1  . Smokeless tobacco: Never Used  . Tobacco comment: wears nicotine patches  Vaping Use  . Vaping Use: Never used  Substance Use Topics  . Alcohol use: Not Currently  . Drug use: Not Currently    Types: Cocaine    Comment: crack  last used 2016    Home Medications Prior to Admission medications   Medication Sig Start Date End Date Taking? Authorizing Provider  albuterol (PROVENTIL HFA;VENTOLIN HFA) 108 (90 Base) MCG/ACT inhaler Inhale 2 puffs into the lungs every 6 (six) hours as needed for wheezing or shortness of breath.   Yes [provider]  albuterol (PROVENTIL) (2.5 MG/3ML) 0.083% nebulizer solution Take 2.5 mg by nebulization every 6 (six) hours as needed for wheezing or shortness of breath.   Yes [provider]  amLODipine (NORVASC) 5 MG tablet Take 5 mg by mouth daily.   Yes [provider]  budesonide-formoterol (SYMBICORT) 80-4.5 MCG/ACT inhaler Inhale 2 puffs into the lungs 2 (two) times daily. 03/19/20  Yes Lindell Spar, MD  celecoxib (CELEBREX) 100 MG capsule Take 1 capsule (100 mg total) by mouth 2 (two) times daily. 05/26/20  Yes Lindell Spar, MD  citalopram (CELEXA) 20 MG tablet Take 20 mg by mouth daily.   Yes [provider]  cloNIDine (CATAPRES) 0.1 MG tablet Take 0.1 mg by mouth daily. 11/04/19  Yes [provider]  hydrOXYzine (VISTARIL) 50 MG capsule Take 1 capsule (50 mg total) by mouth daily as needed for anxiety (Insomnia). Patient taking differently: Take 50 mg by mouth at bedtime. 03/19/20  Yes Lindell Spar, MD  megestrol (MEGACE) 40 MG tablet Take 1 tablet (40 mg total) by mouth daily. 04/26/20  Yes Lindell Spar, MD  methocarbamol (ROBAXIN) 500 MG tablet Take 500 mg by mouth in the morning and at bedtime.   Yes [provider]  omeprazole (PRILOSEC) 20 MG capsule Take 20 mg by mouth daily. 04/28/20  Yes [provider]  pregabalin (LYRICA) 75 MG capsule Take 1 capsule (75 mg total) by mouth 2 (two) times daily. 04/26/20  Yes Lindell Spar, MD  tiZANidine (ZANAFLEX) 2 MG tablet Take 2 mg by mouth 2 (two) times daily as needed for muscle spasms. 11/27/19  Yes [provider]  torsemide (DEMADEX) 5 MG tablet Take 5 mg by mouth daily. 11/20/19  Yes [provider]  Vitamin D, Ergocalciferol, (DRISDOL) 1.25 MG (50000 UNIT) CAPS capsule Take 50,000 Units by mouth every 7 (seven) days.   Yes [provider]  HYDROcodone-acetaminophen (NORCO/VICODIN) 5-325 MG tablet Take 1 tablet by mouth every 8 (eight) hours as needed  for severe pain. Patient not taking: Reported on 05/28/2020 05/04/20   [provider]    Allergies    Clonopin [clonazepam], Fish allergy, Flexeril [cyclobenzaprine hcl], Ibuprofen, Shellfish allergy, Ace inhibitors, Tramadol, and Trazodone and nefazodone  Review of Systems   Review of Systems Ten systems reviewed and are negative for acute change, except as noted in the HPI.   Physical Exam Updated Vital Signs BP (!) 138/92 (BP Location: Right Arm)   Pulse 79   Temp 98.2 F (36.8 C) (Oral)   Resp 17   Ht 4\' 11"  (1.499 m)   Wt 85.3 kg   LMP 04/29/2020 (Exact Date)   SpO2 98%  BMI 37.98 kg/m   Physical Exam Vitals and nursing note reviewed.  Constitutional:      General: She is not in acute distress.    Appearance: She is well-developed and well-nourished. She is not diaphoretic.  HENT:     Head: Normocephalic and atraumatic.  Eyes:     General: No scleral icterus.    Conjunctiva/sclera: Conjunctivae normal.  Cardiovascular:     Rate and Rhythm: Normal rate and regular rhythm.     Heart sounds: Normal heart sounds. No murmur heard. No friction rub. No gallop.   Pulmonary:     Effort: Pulmonary effort is normal. No respiratory distress.     Breath sounds: Normal breath sounds.  Abdominal:     General: Bowel sounds are normal. There is no distension.     Palpations: Abdomen is soft. There is no mass.     Tenderness: There is no abdominal tenderness. There is no guarding.  Genitourinary:    Comments: Normal external female genitalia. Normal vagina without discharge. Minimal oozing form the cervical os. Generalized pain on exam. No CMT or cervical discharge. Musculoskeletal:     Cervical back: Normal range of motion.  Skin:    General: Skin is warm and dry.  Neurological:     Mental Status: She is alert and oriented to person, place, and time.  Psychiatric:        Behavior: Behavior normal.     ED Results / Procedures / Treatments   Labs (all labs  ordered are listed, but only abnormal results are displayed) Labs Reviewed - No data to display  EKG None  Radiology No results found.  Procedures Procedures   Medications Ordered in ED Medications - No data to display  ED Course  I have reviewed the triage vital signs and the nursing notes.  Pertinent labs & imaging results that were available during my care of the patient were reviewed by me and considered in my medical decision making (see chart for details).    MDM Rules/Calculators/A&P                          HY:[id number] bleeding VS:  Vitals:   05/28/20 0948 05/28/20 0949 05/28/20 1000 05/28/20 1025  BP: (!) 138/92  121/79 125/84  Pulse: 79  75 80  Resp: 17     Temp: 98.2 F (36.8 C)     TempSrc: Oral     SpO2: 98%  100% 100%  Weight:  85.3 kg    Height:  4\' 11"  (1.499 m)      YI:RSWNIOE is gathered by patient and emr. Previous records obtained and reviewed. DDX:The patient's complaint of vaginal bleeding involves an extensive number of diagnostic and treatment options, and is a complaint that carries with it a high risk of complications, morbidity, and potential mortality. Given the large differential diagnosis, medical decision making is of high complexity. Differential diagnosis for nonpregnant vaginal bleeding includes but is not limited to systemic causes such as cirrhosis of the liver, coagulopathy such as ITP or von Willebrand's disease, strep vaginitis, HRT, hypothyroidism, secondary anovulation.  Reproductive tract causes include adenomyosis, atrophic endometrium, dysfunctional uterine bleeding, endometriosis, fibroids, foreign body, infection, IUD, neoplasia or vaginal trauma.  Labs: I ordered reviewed and interpreted labs which included CBC which shows no anemia or leukocytosis BMP without abnormality, wet prep shows many clue cells.  Imaging:Recent pelvic ultrasound reviewed shows no multiple fibroids and no other abnormalities EKG: Consults: MDM:  50 year old female here with foul vaginal odor, continued bleeding which has slowed down after Megace treatment and pelvic pain. Patient has chronic pain due to her back and was apparently refused refills on her hydrocodone as her back doctor did not want to treat her pain since she is scheduled for pelvic surgery on the 23rd. This may be contributing to her pain. She does not appear to have any significant bleeding on examination. She is not having any discharge from the cervix. Since the patient is likely alkalized from chronic bleeding and will have recurrent episodes of bacterial vaginosis I have opted to treat her with boric acid suppository sent to the compounding pharmacy. We will treat with Celebrex for pain as it appears she has tolerated this NSAID in the past. Patient is normotensive without orthostatic vital signs and I doubt she is having significant bleeding enough to cause her to be presyncopal. She appears otherwise appropriate for discharge at this time Patient disposition:The patient appears reasonably screened and/or stabilized for discharge and I doubt any other medical condition or other [facility] requiring further screening, evaluation, or treatment in the ED at this time prior to discharge. I have discussed lab and/or imaging findings with the patient and answered all questions/concerns to the best of my ability.I have discussed return precautions and OP follow up.    Final Clinical Impression(s) / ED Diagnoses Final diagnoses:  Abnormal uterine bleeding  Bacterial vaginosis  Pelvic pain    Rx / DC Orders ED Discharge Orders    None       Margarita Mail, PA-C 05/28/20 1256    Margarita Mail, PA-C 06/01/20 1447    Fredia Sorrow, MD 06/02/20 [phone]

## 2020-05-28 NOTE — ED Notes (Signed)
Provider in room  

## 2020-05-28 NOTE — Discharge Instructions (Signed)
You appear to have bacterial vaginosis (BV) as the cause of your pain. I am unable to prescribe you narcotic pain medication and you will need to follow up with your gynecologist or your back doctor for refills on narcotics. You may take tylenol with the medication I have prescribed and you may apply warm packs to your belly. I am discharging you with treatment for your odor caused by BV. Please use this IN YOUR VAGINA and DO NOT TAKE BY MOUTH! Your labs show no evidence of severe blood loss.   Please make a close follow up appointment with your gynecologist if you continue to have pelvic pain.

## 2020-05-31 LAB — GC/CHLAMYDIA PROBE AMP (~~LOC~~) NOT AT ARMC
Chlamydia: NEGATIVE
Comment: NEGATIVE
Comment: NORMAL
Neisseria Gonorrhea: NEGATIVE

## 2020-06-01 ENCOUNTER — Encounter (HOSPITAL_COMMUNITY): Payer: Self-pay

## 2020-06-01 ENCOUNTER — Emergency Department (HOSPITAL_COMMUNITY)
Admission: EM | Admit: 2020-06-01 | Discharge: 2020-06-01 | Disposition: A | Discharge status: Home / Self Care | Payer: Medicaid Other | Attending: Emergency Medicine | Admitting: Emergency Medicine

## 2020-06-01 ENCOUNTER — Other Ambulatory Visit: Payer: Self-pay

## 2020-06-01 DIAGNOSIS — K219 Gastro-esophageal reflux disease without esophagitis: Secondary | ICD-10-CM | POA: Insufficient documentation

## 2020-06-01 DIAGNOSIS — G8929 Other chronic pain: Secondary | ICD-10-CM | POA: Insufficient documentation

## 2020-06-01 DIAGNOSIS — J45909 Unspecified asthma, uncomplicated: Secondary | ICD-10-CM | POA: Diagnosis not present

## 2020-06-01 DIAGNOSIS — Z87891 Personal history of nicotine dependence: Secondary | ICD-10-CM | POA: Insufficient documentation

## 2020-06-01 DIAGNOSIS — R102 Pelvic and perineal pain: Secondary | ICD-10-CM | POA: Insufficient documentation

## 2020-06-01 DIAGNOSIS — Z7951 Long term (current) use of inhaled steroids: Secondary | ICD-10-CM | POA: Insufficient documentation

## 2020-06-01 DIAGNOSIS — I10 Essential (primary) hypertension: Secondary | ICD-10-CM | POA: Insufficient documentation

## 2020-06-01 DIAGNOSIS — Z79899 Other long term (current) drug therapy: Secondary | ICD-10-CM | POA: Diagnosis not present

## 2020-06-01 DIAGNOSIS — R1110 Vomiting, unspecified: Secondary | ICD-10-CM | POA: Diagnosis not present

## 2020-06-01 DIAGNOSIS — J449 Chronic obstructive pulmonary disease, unspecified: Secondary | ICD-10-CM | POA: Diagnosis not present

## 2020-06-01 LAB — CBC
HCT: 36 % (ref 36.0–46.0)
Hemoglobin: 11.5 g/dL — ABNORMAL LOW (ref 12.0–15.0)
MCH: 29 pg (ref 26.0–34.0)
MCHC: 31.9 g/dL (ref 30.0–36.0)
MCV: 90.7 fL (ref 80.0–100.0)
Platelets: 684 10*3/uL — ABNORMAL HIGH (ref 150–400)
RBC: 3.97 MIL/uL (ref 3.87–5.11)
RDW: 14.3 % (ref 11.5–15.5)
WBC: 8.1 10*3/uL (ref 4.0–10.5)
nRBC: 0 % (ref 0.0–0.2)

## 2020-06-01 MED ORDER — ONDANSETRON 4 MG PO TBDP: 4.0000 mg | ORAL_TABLET | Freq: Three times a day (TID) | ORAL | 0 refills | Status: DC | PRN

## 2020-06-01 MED ORDER — ONDANSETRON 4 MG PO TBDP
4.0000 mg | ORAL_TABLET | Freq: Once | ORAL | Status: AC
  Administered 2020-06-01: 4 mg via ORAL
  Filled 2020-06-01: qty 1

## 2020-06-01 MED ORDER — CELECOXIB 100 MG PO CAPS
200.0000 mg | ORAL_CAPSULE | Freq: Once | ORAL | Status: AC
  Administered 2020-06-01: 200 mg via ORAL
  Filled 2020-06-01: qty 2

## 2020-06-01 MED ORDER — HYDROCODONE-ACETAMINOPHEN 5-325 MG PO TABS
1.0000 | ORAL_TABLET | Freq: Once | ORAL | Status: AC
  Administered 2020-06-01: 1 via ORAL
  Filled 2020-06-01: qty 1

## 2020-06-01 NOTE — ED Provider Notes (Signed)
[facility] EMERGENCY DEPARTMENT Provider Note  CSN: [id number] Arrival date & time: 06/01/20 2706    History Chief Complaint  Patient presents with  . Abdominal Pain    HPI  Kathryn [name] is a 50 y.o. female with known uterine fibroids and DUB is scheduled for an ablation next week. She was in the ED 4 days ago for lower abdominal pain and bleeding. Workup then was benign, she was given Celebrex (which she tolerates despite a listed anaphylactic reaction to NSAIDs). She has continued to have pain and now also vomiting. She has not been seen by her Ob/Gyn with her continued pain, states she was told to come back to the ED.    Past Medical History:  Diagnosis Date  . Anxiety   . Arthritis   . Asthma   . Chronic back pain   . COPD (chronic obstructive pulmonary disease) (HCC)    Chronic bronchitis  . Depression   . GERD (gastroesophageal reflux disease)   . Hypertension   . Neuropathy   . Osteoarthritis of left knee, 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  . Suicidal ideation 12/24/2018    Past Surgical History:  Procedure Laterality Date  . ESOPHAGOGASTRODUODENOSCOPY (EGD) WITH PROPOFOL N/A 12/25/2018   Procedure: ESOPHAGOGASTRODUODENOSCOPY (EGD) WITH PROPOFOL;  Surgeon: Rogene Houston, MD;  Location: [facility] SUITE;  Service: Endoscopy;  Laterality: N/A;  . PATELLA-FEMORAL ARTHROPLASTY Left 10/08/2018   Procedure: PATELLA-FEMORAL ARTHROPLASTY;  Surgeon: Marchia Bond, MD;  Location: [facility] ORS;  Service: Orthopedics;  Laterality: Left;  . TUBAL LIGATION      Family History  Problem Relation Age of Onset  . Gout Paternal Grandfather   . Cirrhosis Paternal Grandfather   . Hypertension Paternal Grandmother   . Aneurysm Paternal Grandmother   . Cirrhosis Maternal Grandmother   . Cirrhosis Maternal Grandfather   . Cancer Father   . Cirrhosis Father   . Cirrhosis Mother   . Breast cancer Sister   . Hypertension Sister   . Bronchitis Daughter   . Bronchitis Daughter   . Asthma Son    . Bronchitis Son   . Migraines Neg Hx     Social History   Tobacco Use  . Smoking status: Former Smoker    Types: Cigarettes    Quit date: 2016    Years since quitting: 6.1  . Smokeless tobacco: Never Used  . Tobacco comment: wears nicotine patches  Vaping Use  . Vaping Use: Never used  Substance Use Topics  . Alcohol use: Not Currently  . Drug use: Not Currently    Types: Cocaine    Comment: crack  last used 2016     Home Medications Prior to Admission medications   Medication Sig Start Date End Date Taking? Authorizing Provider  albuterol (PROVENTIL HFA;VENTOLIN HFA) 108 (90 Base) MCG/ACT inhaler Inhale 2 puffs into the lungs every 6 (six) hours as needed for wheezing or shortness of breath.   Yes [provider]  albuterol (PROVENTIL) (2.5 MG/3ML) 0.083% nebulizer solution Take 2.5 mg by nebulization every 6 (six) hours as needed for wheezing or shortness of breath.   Yes [provider]  amLODipine (NORVASC) 5 MG tablet Take 5 mg by mouth daily.   Yes [provider]  Boric Acid Vaginal 600 MG SUPP Place 600 mg vaginally as directed. Place one capsule inside the vagina at bedtime every other night for 3 doses. Use once a week at bedtime thereafter for foul odor. 05/28/20  Yes Margarita Mail, PA-C  budesonide-formoterol (  SYMBICORT) 80-4.5 MCG/ACT inhaler Inhale 2 puffs into the lungs 2 (two) times daily. 03/19/20  Yes Lindell Spar, MD  celecoxib (CELEBREX) 200 MG capsule Take 1 capsule (200 mg total) by mouth 2 (two) times daily. For pain 05/28/20  Yes Harris, Abigail, PA-C  citalopram (CELEXA) 20 MG tablet Take 20 mg by mouth daily.   Yes [provider]  cloNIDine (CATAPRES) 0.1 MG tablet Take 0.1 mg by mouth daily. 11/04/19  Yes [provider]  HYDROcodone-acetaminophen (NORCO/VICODIN) 5-325 MG tablet Take 1 tablet by mouth every 8 (eight) hours as needed for severe pain. 05/04/20  Yes [provider]  hydrOXYzine  (VISTARIL) 50 MG capsule Take 1 capsule (50 mg total) by mouth daily as needed for anxiety (Insomnia). Patient taking differently: Take 50 mg by mouth at bedtime. 03/19/20  Yes Lindell Spar, MD  megestrol (MEGACE) 40 MG tablet Take 1 tablet (40 mg total) by mouth daily. 04/26/20  Yes Lindell Spar, MD  methocarbamol (ROBAXIN) 500 MG tablet Take 500 mg by mouth in the morning and at bedtime.   Yes [provider]  omeprazole (PRILOSEC) 20 MG capsule Take 20 mg by mouth daily. 04/28/20  Yes [provider]  ondansetron (ZOFRAN ODT) 4 MG disintegrating tablet Take 1 tablet (4 mg total) by mouth every 8 (eight) hours as needed for nausea or vomiting. 06/01/20  Yes Truddie Hidden, MD  pregabalin (LYRICA) 75 MG capsule Take 1 capsule (75 mg total) by mouth 2 (two) times daily. 04/26/20  Yes Lindell Spar, MD  tiZANidine (ZANAFLEX) 2 MG tablet Take 2 mg by mouth 2 (two) times daily as needed for muscle spasms. 11/27/19  Yes [provider]  torsemide (DEMADEX) 5 MG tablet Take 5 mg by mouth daily. 11/20/19  Yes [provider]  Vitamin D, Ergocalciferol, (DRISDOL) 1.25 MG (50000 UNIT) CAPS capsule Take 50,000 Units by mouth every 7 (seven) days.   Yes [provider]     Allergies    Clonopin [clonazepam], Fish allergy, Flexeril [cyclobenzaprine hcl], Ibuprofen, Shellfish allergy, Ace inhibitors, Tramadol, and Trazodone and nefazodone   Review of Systems   Review of Systems A comprehensive review of systems was completed and negative except as noted in HPI.    Physical Exam BP 104/64   Pulse 75   Temp 98.2 F (36.8 C) (Oral)   Resp 14   Ht 4\' 11"  (1.499 m)   Wt 81.6 kg   SpO2 98%   BMI 36.36 kg/m   Physical Exam Vitals and nursing note reviewed.  Constitutional:      Appearance: Normal appearance.  HENT:     Head: Normocephalic and atraumatic.     Nose: Nose normal.     Mouth/Throat:     Mouth: Mucous membranes are moist.  Eyes:      Extraocular Movements: Extraocular movements intact.     Conjunctiva/sclera: Conjunctivae normal.  Cardiovascular:     Rate and Rhythm: Normal rate.  Pulmonary:     Effort: Pulmonary effort is normal.     Breath sounds: Normal breath sounds.  Abdominal:     General: Abdomen is flat.     Palpations: Abdomen is soft.     Tenderness: There is generalized abdominal tenderness. There is no guarding. Negative signs include Murphy's sign and McBurney's sign.  Musculoskeletal:        General: No swelling. Normal range of motion.     Cervical back: Neck supple.  Skin:  General: Skin is warm and dry.  Neurological:     General: No focal deficit present.     Mental Status: She is alert.  Psychiatric:        Mood and Affect: Mood normal.      ED Results / Procedures / Treatments   Labs (all labs ordered are listed, but only abnormal results are displayed) Labs Reviewed  CBC - Abnormal; Notable for the following components:      Result Value   Hemoglobin 11.5 (*)    Platelets 684 (*)    All other components within normal limits    EKG None  Radiology No results found.  Procedures Procedures  Medications Ordered in the ED Medications  celecoxib (CELEBREX) capsule 200 mg (has no administration in time range)  ondansetron (ZOFRAN-ODT) disintegrating tablet 4 mg (4 mg Oral Given 06/01/20 1034)  HYDROcodone-acetaminophen (NORCO/VICODIN) 5-325 MG per tablet 1 tablet (1 tablet Oral Given 06/01/20 1034)     MDM Rules/Calculators/A&P MDM Patient with known fibroids, pain likely from that. Will recheck CBC due to reports of continued bleeding. Norco and zofran for symptom relief.  ED Course  I have reviewed the triage vital signs and the nursing notes.  Pertinent labs & imaging results that were available during my care of the patient were reviewed by me and considered in my medical decision making (see chart for details).  Clinical Course as of 06/01/20 1212  Tue Jun 01, 2020   1134 No significant change in Hgb from previous. Patient has not had any vomiting in the ED. Advised to follow up with Ob/Gyn for long term management of her symptoms until she can have her ablation procedure done. Continue celebrex at home.  [CS]    Clinical Course User Index [CS] Truddie Hidden, MD    Final Clinical Impression(s) / ED Diagnoses Final diagnoses:  Chronic pelvic pain in female    Rx / DC Orders ED Discharge Orders         Ordered    ondansetron (ZOFRAN ODT) 4 MG disintegrating tablet  Every 8 hours PRN        06/01/20 1212           Truddie Hidden, MD 06/01/20 1213

## 2020-06-01 NOTE — ED Triage Notes (Signed)
Was here 05/28/20 for complaints of vaginal odor, pain and fibroids Reports she is now having the same issues with abdominal pain, vaginal bleeding, vomiting - "unable to keep anything down"

## 2020-06-02 ENCOUNTER — Other Ambulatory Visit: Payer: Self-pay | Admitting: Internal Medicine

## 2020-06-02 ENCOUNTER — Telehealth: Payer: Self-pay

## 2020-06-02 DIAGNOSIS — Z9189 Other specified personal risk factors, not elsewhere classified: Secondary | ICD-10-CM

## 2020-06-02 DIAGNOSIS — F4310 Post-traumatic stress disorder, unspecified: Secondary | ICD-10-CM | POA: Diagnosis not present

## 2020-06-02 DIAGNOSIS — Z5982 Transportation insecurity: Secondary | ICD-10-CM

## 2020-06-02 NOTE — Telephone Encounter (Signed)
Transition Care Management Follow-up Telephone Call  Date of discharge and from where: 06/01/2020 [facility]  How have you been since you were released from the hospital? Doing ok but still hurting, surgery scheduled for 06/09/2020 Any questions or concerns? No Items Reviewed:  Did the pt receive and understand the discharge instructions provided? Yes   Medications obtained and verified? Yes   Other? No   Any new allergies since your discharge? No   Dietary orders reviewed? Yes  Do you have support at home? Yes   Home Care and Equipment/Supplies: Were home health services ordered? not applicable If so, what is the name of the agency?   Has the agency set up a time to come to the patient's home? not applicable Were any new equipment or medical supplies ordered?  No What is the name of the medical supply agency? No Were you able to get the supplies/equipment? not applicable Do you have any questions related to the use of the equipment or supplies? No  Functional Questionnaire: (I = Independent and D = Dependent) ADLs: I  Bathing/Dressing- I  Meal Prep- D Nurses aid   Eating- I  Maintaining continence- I  Transferring/Ambulation- I  Managing Meds- I  Follow up appointments reviewed:   PCP Hospital f/u appt confirmed? No Having surgery   Specialist Hospital f/u appt confirmed? Yes  .  Are transportation arrangements needed? Yes   If their condition worsens, is the pt aware to call PCP or go to the Emergency Dept.? Yes  Was the patient provided with contact information for the PCP's office or ED? Yes  Was to pt encouraged to call back with questions or concerns? Yes

## 2020-06-03 NOTE — Patient Instructions (Signed)
QUIERA DIFFEE  06/03/2020     @PREFPERIOPPHARMACY @   Your procedure is scheduled on  06/09/2020   Report to [facility] at  [place].M.   Call this number if you have problems the morning of surgery:  [phone]   Remember:  Do not eat or drink after midnight.                      Take these medicines the morning of surgery with A SIP OF WATER       Amlodipine, celebrex, celexa, clonidine, hydrocodone (If needed), robaxin (if needed), prilosec, zofran (if needed), zanaflex (if needed).     Use your inhalers and your nebulizer before you come to the hospital. Bring your rescue inhaler with you.      Please brush your teeth  Do not wear jewelry, make-up or nail polish.  Do not wear lotions, powders, or perfumes, or deodorant.  Do not shave 48 hours prior to surgery.  Men may shave face and neck.  Do not bring valuables to the hospital.  [facility] is not responsible for any belongings or valuables.  Contacts, dentures or bridgework may not be worn into surgery.  Leave your suitcase in the car.  After surgery it may be brought to your room.  For patients admitted to the hospital, discharge time will be determined by your treatment team.  Patients discharged the day of surgery will not be allowed to drive home and must have someone with them for 24 hours.  Shower the night before and the morning of your procedure with CHG. DO NOT put CHG on your face, hair or genitals.  After your night shower, dry off with a clean towel, put on clean clothes to sleep in and place clean sheets on your bed before you sleep.                                DO NOT sleep with pets this night.   After your morning shower, dry off with a clean towel, put on clean comfortable clothes and brush your teeth before you come to the hospital.   Special instructions:   DO NOT smoke tobacco or vape the morning of your procedure.  Please read over the following fact sheets that you were  given. Coughing and Deep Breathing, Surgical Site Infection Prevention, Anesthesia Post-op Instructions and Care and Recovery After Surgery       Dilation and Curettage or Vacuum Curettage, Care After This sheet gives you information about how to care for yourself after your procedure. Your doctor may also give you more specific instructions. If you have problems or questions, contact your doctor. What can I expect after the procedure? After the procedure, it is common to have:  Mild pain or cramping.  Some bleeding or spotting from the vagina. These may last for up to 2 weeks. Follow these instructions at home: Medicines  Take over-the-counter and prescription medicines only as told by your doctor. This is very important if you take blood-thinning medicine.  Ask your doctor if the medicine prescribed to you requires you to avoid driving or using machinery. Activity  If you were given a medicine to help you relax (sedative) during your procedure, it can affect you for many hours. Do not drive or use machinery until your doctor says that it is safe.  Rest as  told by your doctor.  Do not sit for a long time without moving. Get up to take short walks every 1-2 hours. This is important. Ask for help if you feel weak or unsteady.  Do not lift anything that is heavier than 10 lb (4.5 kg), or the limit that you are told, until your doctor says that it is safe.  Return to your normal activities as told by your doctor. Ask your doctor what activities are safe for you.   Lifestyle For at least 2 weeks, or as long as told by your doctor:  Do not douche.  Do not use tampons.  Do not have sex. General instructions  Wear compression stockings as told by your doctor.  It is up to you to get the results of your procedure. Ask your doctor, or the department that is doing the procedure, when your results will be ready.  Keep all follow-up visits as told by your doctor. This is  important. Contact a doctor if:  You have very bad cramps that get worse or do not get better with medicine.  You have very bad pain in your belly (abdomen).  You cannot drink fluids without vomiting.  You have pain in a different part of your pelvis. The pelvis is the area just above your thighs.  You have fluid from your vagina that smells bad.  You have a rash. Get help right away if:  You are bleeding a lot from your vagina. A lot of bleeding means soaking more than one sanitary pad in 1 hour for 2 hours in a row.  You have a fever that is above 100.56F (38.0C).  Your belly feels very tender or hard.  You have chest pain.  You have trouble breathing.  You feel dizzy.  You feel light-headed.  You pass out (faint).  You have pain in your neck or shoulder area. These symptoms may be an emergency. Do not wait to see if the symptoms will go away. Get medical help right away. Call your local emergency services (911 in the [place] Do not drive yourself to the hospital. Summary  After your procedure, it is common to have pain or cramping. It is also common to have bleeding or spotting from your vagina.  Rest as told. Do not sit for a long time without moving. Get up to take short walks every 1-2 hours.  Do not lift anything that is heavier than 10 lb (4.5 kg), or the limit that you are told.  Contact your doctor if you have fluid from your vagina that smells bad.  Get help right away if you develop any problems from the procedure. Ask your doctor what problems to watch for. This information is not intended to replace advice given to you by your health care provider. Make sure you discuss any questions you have with your health care provider. Document Revised: 05/06/2019 Document Reviewed: 05/06/2019 Elsevier Patient Education  2021 [place] Anesthesia, Adult, Care After This sheet gives you information about how to care for yourself after your procedure.  Your health care provider may also give you more specific instructions. If you have problems or questions, contact your health care provider. What can I expect after the procedure? After the procedure, the following side effects are common:  Pain or discomfort at the IV site.  Nausea.  Vomiting.  Sore throat.  Trouble concentrating.  Feeling cold or chills.  Feeling weak or tired.  Sleepiness and fatigue.  Soreness and  body aches. These side effects can affect parts of the body that were not involved in surgery. Follow these instructions at home: For the time period you were told by your health care provider:  Rest.  Do not participate in activities where you could fall or become injured.  Do not drive or use machinery.  Do not drink alcohol.  Do not take sleeping pills or medicines that cause drowsiness.  Do not make important decisions or sign legal documents.  Do not take care of children on your own.   Eating and drinking  Follow any instructions from your health care provider about eating or drinking restrictions.  When you feel hungry, start by eating small amounts of foods that are soft and easy to digest (bland), such as toast. Gradually return to your regular diet.  Drink enough fluid to keep your urine pale yellow.  If you vomit, rehydrate by drinking water, juice, or clear broth. General instructions  If you have sleep apnea, surgery and certain medicines can increase your risk for breathing problems. Follow instructions from your health care provider about wearing your sleep device: ? Anytime you are sleeping, including during daytime naps. ? While taking prescription pain medicines, sleeping medicines, or medicines that make you drowsy.  Have a responsible adult stay with you for the time you are told. It is important to have someone help care for you until you are awake and alert.  Return to your normal activities as told by your health care  provider. Ask your health care provider what activities are safe for you.  Take over-the-counter and prescription medicines only as told by your health care provider.  If you smoke, do not smoke without supervision.  Keep all follow-up visits as told by your health care provider. This is important. Contact a health care provider if:  You have nausea or vomiting that does not get better with medicine.  You cannot eat or drink without vomiting.  You have pain that does not get better with medicine.  You are unable to pass urine.  You develop a skin rash.  You have a fever.  You have redness around your IV site that gets worse. Get help right away if:  You have difficulty breathing.  You have chest pain.  You have blood in your urine or stool, or you vomit blood. Summary  After the procedure, it is common to have a sore throat or nausea. It is also common to feel tired.  Have a responsible adult stay with you for the time you are told. It is important to have someone help care for you until you are awake and alert.  When you feel hungry, start by eating small amounts of foods that are soft and easy to digest (bland), such as toast. Gradually return to your regular diet.  Drink enough fluid to keep your urine pale yellow.  Return to your normal activities as told by your health care provider. Ask your health care provider what activities are safe for you. This information is not intended to replace advice given to you by your health care provider. Make sure you discuss any questions you have with your health care provider. Document Revised: 12/18/2019 Document Reviewed: 07/17/2019 Elsevier Patient Education  2021 [employer].

## 2020-06-04 ENCOUNTER — Other Ambulatory Visit: Payer: Self-pay | Admitting: Obstetrics & Gynecology

## 2020-06-07 ENCOUNTER — Encounter (HOSPITAL_COMMUNITY): Payer: Self-pay

## 2020-06-07 ENCOUNTER — Other Ambulatory Visit (HOSPITAL_COMMUNITY)
Admission: RE | Admit: 2020-06-07 | Discharge: 2020-06-07 | Disposition: A | Discharge status: Home / Self Care | Payer: Medicaid Other | Source: Ambulatory Visit | Attending: Obstetrics & Gynecology | Admitting: Obstetrics & Gynecology

## 2020-06-07 ENCOUNTER — Encounter (HOSPITAL_COMMUNITY)
Admission: RE | Admit: 2020-06-07 | Discharge: 2020-06-07 | Disposition: A | Discharge status: Home / Self Care | Payer: Medicaid Other | Source: Ambulatory Visit | Attending: Obstetrics & Gynecology | Admitting: Obstetrics & Gynecology

## 2020-06-07 ENCOUNTER — Other Ambulatory Visit: Payer: Self-pay

## 2020-06-07 DIAGNOSIS — Z20822 Contact with and (suspected) exposure to covid-19: Secondary | ICD-10-CM | POA: Diagnosis not present

## 2020-06-07 DIAGNOSIS — I10 Essential (primary) hypertension: Secondary | ICD-10-CM | POA: Diagnosis not present

## 2020-06-07 DIAGNOSIS — Z01818 Encounter for other preprocedural examination: Secondary | ICD-10-CM | POA: Diagnosis not present

## 2020-06-07 LAB — CBC
HCT: 37.9 % (ref 36.0–46.0)
Hemoglobin: 12.2 g/dL (ref 12.0–15.0)
MCH: 29.7 pg (ref 26.0–34.0)
MCHC: 32.2 g/dL (ref 30.0–36.0)
MCV: 92.2 fL (ref 80.0–100.0)
Platelets: 584 10*3/uL — ABNORMAL HIGH (ref 150–400)
RBC: 4.11 MIL/uL (ref 3.87–5.11)
RDW: 14.5 % (ref 11.5–15.5)
WBC: 8.6 10*3/uL (ref 4.0–10.5)
nRBC: 0 % (ref 0.0–0.2)

## 2020-06-07 LAB — COMPREHENSIVE METABOLIC PANEL
ALT: 18 U/L (ref 0–44)
AST: 16 U/L (ref 15–41)
Albumin: 3.5 g/dL (ref 3.5–5.0)
Alkaline Phosphatase: 66 U/L (ref 38–126)
Anion gap: 10 (ref 5–15)
BUN: 21 mg/dL — ABNORMAL HIGH (ref 6–20)
CO2: 22 mmol/L (ref 22–32)
Calcium: 9.1 mg/dL (ref 8.9–10.3)
Chloride: 107 mmol/L (ref 98–111)
Creatinine, Ser: 0.86 mg/dL (ref 0.44–1.00)
GFR, Estimated: >60 mL/min (ref >60)
Glucose, Bld: 96 mg/dL (ref 70–99)
Potassium: 4.1 mmol/L (ref 3.5–5.1)
Sodium: 139 mmol/L (ref 135–145)
Total Bilirubin: 0.4 mg/dL (ref 0.3–1.2)
Total Protein: 7.5 g/dL (ref 6.5–8.1)

## 2020-06-07 LAB — HCG, QUANTITATIVE, PREGNANCY: hCG, Beta Chain, Quant, S: <1 m[IU]/mL (ref <5)

## 2020-06-07 LAB — SARS CORONAVIRUS 2 (TAT 6-24 HRS): SARS Coronavirus 2: NEGATIVE

## 2020-06-08 ENCOUNTER — Telehealth: Payer: Self-pay | Admitting: Internal Medicine

## 2020-06-08 NOTE — Telephone Encounter (Signed)
   Telephone encounter was:  Unsuccessful.  06/08/2020 Name: Kathryn [name] MRN: [id number] DOB: 02-11-1971  Unsuccessful outbound call made today to assist with:  Transportation Needs   Outreach Attempt:  1st Attempt  Could not leave message due to patient's voicemail box being full. Will try to give the patient a call back at a later time.   [place], Care Management Phone: [phone] Email: [email or web address]

## 2020-06-09 ENCOUNTER — Ambulatory Visit (HOSPITAL_COMMUNITY)
Admission: RE | Admit: 2020-06-09 | Discharge: 2020-06-09 | Disposition: A | Discharge status: Home / Self Care | Payer: Medicaid Other | Attending: Obstetrics & Gynecology | Admitting: Obstetrics & Gynecology

## 2020-06-09 ENCOUNTER — Encounter (HOSPITAL_COMMUNITY): Payer: Self-pay | Admitting: Obstetrics & Gynecology

## 2020-06-09 ENCOUNTER — Other Ambulatory Visit: Payer: Self-pay | Admitting: Obstetrics & Gynecology

## 2020-06-09 ENCOUNTER — Ambulatory Visit (HOSPITAL_COMMUNITY): Payer: Medicaid Other | Admitting: Certified Registered Nurse Anesthetist

## 2020-06-09 ENCOUNTER — Encounter (HOSPITAL_COMMUNITY)
Admission: RE | Disposition: A | Discharge status: Home / Self Care | Payer: Self-pay | Source: Home / Self Care | Attending: Obstetrics & Gynecology

## 2020-06-09 DIAGNOSIS — Z7951 Long term (current) use of inhaled steroids: Secondary | ICD-10-CM | POA: Insufficient documentation

## 2020-06-09 DIAGNOSIS — Z79899 Other long term (current) drug therapy: Secondary | ICD-10-CM | POA: Diagnosis not present

## 2020-06-09 DIAGNOSIS — N920 Excessive and frequent menstruation with regular cycle: Secondary | ICD-10-CM | POA: Diagnosis not present

## 2020-06-09 DIAGNOSIS — Z888 Allergy status to other drugs, medicaments and biological substances status: Secondary | ICD-10-CM | POA: Diagnosis not present

## 2020-06-09 DIAGNOSIS — Z87892 Personal history of anaphylaxis: Secondary | ICD-10-CM | POA: Insufficient documentation

## 2020-06-09 DIAGNOSIS — N921 Excessive and frequent menstruation with irregular cycle: Secondary | ICD-10-CM | POA: Diagnosis not present

## 2020-06-09 DIAGNOSIS — Z886 Allergy status to analgesic agent status: Secondary | ICD-10-CM | POA: Insufficient documentation

## 2020-06-09 DIAGNOSIS — Z791 Long term (current) use of non-steroidal anti-inflammatories (NSAID): Secondary | ICD-10-CM | POA: Insufficient documentation

## 2020-06-09 DIAGNOSIS — Z885 Allergy status to narcotic agent status: Secondary | ICD-10-CM | POA: Insufficient documentation

## 2020-06-09 DIAGNOSIS — Z91013 Allergy to seafood: Secondary | ICD-10-CM | POA: Diagnosis not present

## 2020-06-09 DIAGNOSIS — Z87891 Personal history of nicotine dependence: Secondary | ICD-10-CM | POA: Diagnosis not present

## 2020-06-09 DIAGNOSIS — J449 Chronic obstructive pulmonary disease, unspecified: Secondary | ICD-10-CM | POA: Diagnosis not present

## 2020-06-09 DIAGNOSIS — N946 Dysmenorrhea, unspecified: Secondary | ICD-10-CM | POA: Insufficient documentation

## 2020-06-09 HISTORY — PX: DILATATION AND CURETTAGE/HYSTEROSCOPY WITH MINERVA: SHX6851

## 2020-06-09 SURGERY — DILATATION AND CURETTAGE/HYSTEROSCOPY WITH MINERVA
Anesthesia: General | Site: Vagina

## 2020-06-09 MED ORDER — SODIUM CHLORIDE 0.9 % IR SOLN
Status: DC | PRN
  Administered 2020-06-09: 1000 mL

## 2020-06-09 MED ORDER — CHLORHEXIDINE GLUCONATE 0.12 % MT SOLN
OROMUCOSAL | Status: AC
  Filled 2020-06-09: qty 15

## 2020-06-09 MED ORDER — CHLORHEXIDINE GLUCONATE 0.12 % MT SOLN
15.0000 mL | Freq: Once | OROMUCOSAL | Status: AC
  Administered 2020-06-09: 15 mL via OROMUCOSAL

## 2020-06-09 MED ORDER — FENTANYL CITRATE (PF) 100 MCG/2ML IJ SOLN
25.0000 ug | INTRAMUSCULAR | Status: DC | PRN
  Administered 2020-06-09 (×3): 50 ug via INTRAVENOUS
  Filled 2020-06-09 (×2): qty 2

## 2020-06-09 MED ORDER — ORAL CARE MOUTH RINSE: 15.0000 mL | Freq: Once | OROMUCOSAL | Status: AC

## 2020-06-09 MED ORDER — ONDANSETRON HCL 4 MG/2ML IJ SOLN: 4.0000 mg | Freq: Once | INTRAMUSCULAR | Status: DC | PRN

## 2020-06-09 MED ORDER — CEFAZOLIN SODIUM-DEXTROSE 2-4 GM/100ML-% IV SOLN
INTRAVENOUS | Status: AC
  Filled 2020-06-09: qty 100

## 2020-06-09 MED ORDER — POVIDONE-IODINE 10 % EX SWAB: 2.0000 "application " | Freq: Once | CUTANEOUS | Status: DC

## 2020-06-09 MED ORDER — FENTANYL CITRATE (PF) 100 MCG/2ML IJ SOLN
INTRAMUSCULAR | Status: AC
  Filled 2020-06-09: qty 2

## 2020-06-09 MED ORDER — LIDOCAINE HCL (CARDIAC) PF 100 MG/5ML IV SOSY
PREFILLED_SYRINGE | INTRAVENOUS | Status: DC | PRN
  Administered 2020-06-09: 60 mg via INTRATRACHEAL

## 2020-06-09 MED ORDER — ONDANSETRON 8 MG PO TBDP: 8.0000 mg | ORAL_TABLET | Freq: Three times a day (TID) | ORAL | 0 refills | Status: DC | PRN

## 2020-06-09 MED ORDER — SUCCINYLCHOLINE CHLORIDE 200 MG/10ML IV SOSY
PREFILLED_SYRINGE | INTRAVENOUS | Status: DC | PRN
  Administered 2020-06-09: 160 mg via INTRAVENOUS

## 2020-06-09 MED ORDER — LIDOCAINE HCL (PF) 2 % IJ SOLN
INTRAMUSCULAR | Status: AC
  Filled 2020-06-09: qty 10

## 2020-06-09 MED ORDER — OXYCODONE HCL 5 MG PO TABS: 5.0000 mg | ORAL_TABLET | ORAL | 0 refills | Status: DC | PRN

## 2020-06-09 MED ORDER — LACTATED RINGERS IV SOLN
INTRAVENOUS | Status: DC
  Administered 2020-06-09: 1000 mL via INTRAVENOUS

## 2020-06-09 MED ORDER — ONDANSETRON HCL 4 MG/2ML IJ SOLN
INTRAMUSCULAR | Status: DC | PRN
  Administered 2020-06-09: 4 mg via INTRAVENOUS

## 2020-06-09 MED ORDER — PROPOFOL 10 MG/ML IV BOLUS
INTRAVENOUS | Status: DC | PRN
  Administered 2020-06-09: 200 mg via INTRAVENOUS

## 2020-06-09 MED ORDER — ONDANSETRON HCL 4 MG/2ML IJ SOLN
INTRAMUSCULAR | Status: AC
  Filled 2020-06-09: qty 2

## 2020-06-09 MED ORDER — HYDROCODONE-ACETAMINOPHEN 5-325 MG PO TABS
1.0000 | ORAL_TABLET | Freq: Four times a day (QID) | ORAL | 0 refills | Status: DC | PRN
Start: 2020-06-09 — End: 2020-06-09

## 2020-06-09 MED ORDER — MIDAZOLAM HCL 2 MG/2ML IJ SOLN
INTRAMUSCULAR | Status: AC
  Filled 2020-06-09: qty 2

## 2020-06-09 MED ORDER — SUCCINYLCHOLINE CHLORIDE 200 MG/10ML IV SOSY
PREFILLED_SYRINGE | INTRAVENOUS | Status: AC
  Filled 2020-06-09: qty 10

## 2020-06-09 MED ORDER — CEFAZOLIN SODIUM-DEXTROSE 2-4 GM/100ML-% IV SOLN
2.0000 g | INTRAVENOUS | Status: AC
  Administered 2020-06-09: 2 g via INTRAVENOUS

## 2020-06-09 MED ORDER — DEXAMETHASONE SODIUM PHOSPHATE 10 MG/ML IJ SOLN
INTRAMUSCULAR | Status: AC
  Filled 2020-06-09: qty 1

## 2020-06-09 MED ORDER — 0.9 % SODIUM CHLORIDE (POUR BTL) OPTIME
TOPICAL | Status: DC | PRN
  Administered 2020-06-09: 1000 mL

## 2020-06-09 MED ORDER — FENTANYL CITRATE (PF) 100 MCG/2ML IJ SOLN
INTRAMUSCULAR | Status: DC | PRN
  Administered 2020-06-09 (×2): 50 ug via INTRAVENOUS
  Administered 2020-06-09: 100 ug via INTRAVENOUS

## 2020-06-09 MED ORDER — DEXAMETHASONE SODIUM PHOSPHATE 10 MG/ML IJ SOLN
INTRAMUSCULAR | Status: DC | PRN
  Administered 2020-06-09: 5 mg via INTRAVENOUS

## 2020-06-09 SURGICAL SUPPLY — 28 items
BAG HAMPER (MISCELLANEOUS) ×2 IMPLANT
CLOTH BEACON ORANGE TIMEOUT ST (SAFETY) ×2 IMPLANT
COVER LIGHT HANDLE STERIS (MISCELLANEOUS) ×4 IMPLANT
COVER WAND RF STERILE (DRAPES) ×2 IMPLANT
GAUZE 4X4 16PLY RFD (DISPOSABLE) ×4 IMPLANT
GLOVE ECLIPSE 8.0 STRL XLNG CF (GLOVE) ×2 IMPLANT
GLOVE SRG 8 PF TXTR STRL LF DI (GLOVE) ×1 IMPLANT
GLOVE SURG ENC MOIS LTX SZ7 (GLOVE) ×1 IMPLANT
GLOVE SURG UNDER POLY LF SZ7 (GLOVE) ×4 IMPLANT
GLOVE SURG UNDER POLY LF SZ8 (GLOVE) ×2
GOWN STRL REUS W/TWL LRG LVL3 (GOWN DISPOSABLE) ×2 IMPLANT
GOWN STRL REUS W/TWL XL LVL3 (GOWN DISPOSABLE) ×2 IMPLANT
HANDPIECE ABLA MINERVA ENDO (MISCELLANEOUS) ×2 IMPLANT
INST SET HYSTEROSCOPY (KITS) ×2 IMPLANT
IV NS 1000ML (IV SOLUTION) ×2
IV NS 1000ML BAXH (IV SOLUTION) ×1 IMPLANT
KIT TURNOVER CYSTO (KITS) ×2 IMPLANT
MANIFOLD NEPTUNE II (INSTRUMENTS) ×2 IMPLANT
MARKER SKIN DUAL TIP RULER LAB (MISCELLANEOUS) ×2 IMPLANT
NS IRRIG 1000ML POUR BTL (IV SOLUTION) ×2 IMPLANT
PACK BASIC III (CUSTOM PROCEDURE TRAY) ×2
PACK SRG BSC III STRL LF ECLPS (CUSTOM PROCEDURE TRAY) ×1 IMPLANT
PAD ARMBOARD 7.5X6 YLW CONV (MISCELLANEOUS) ×2 IMPLANT
PAD TELFA 3X4 1S STER (GAUZE/BANDAGES/DRESSINGS) ×2 IMPLANT
SET BASIN LINEN APH (SET/KITS/TRAYS/PACK) ×2 IMPLANT
SET IRRIG Y TYPE TUR BLADDER L (SET/KITS/TRAYS/PACK) ×2 IMPLANT
SHEET LAVH (DRAPES) ×2 IMPLANT
YANKAUER SUCT BULB TIP 10FT TU (MISCELLANEOUS) ×2 IMPLANT

## 2020-06-09 NOTE — Transfer of Care (Signed)
Immediate Anesthesia Transfer of Care Note  Patient: Kathryn [name]  Procedure(s) Performed: DILATATION AND CURETTAGE/HYSTEROSCOPY WITH MINERVA (N/A Vagina )  Patient Location: PACU  Anesthesia Type:General  Level of Consciousness: awake, alert  and oriented  Airway & Oxygen Therapy: Patient Spontanous Breathing and Patient connected to nasal cannula oxygen  Post-op Assessment: Report given to RN and Post -op Vital signs reviewed and stable  Post vital signs: Reviewed and stable  Last Vitals:  Vitals Value Taken Time  BP    Temp    Pulse    Resp    SpO2      Last Pain:  Vitals:   06/09/20 0845  TempSrc: Oral  PainSc: 9       Patients Stated Pain Goal: 8 (49/44/73 9584)  Complications: No complications documented.

## 2020-06-09 NOTE — Progress Notes (Signed)
Spoke with Dr Elonda Husky re pts tylenol allergy- he will call in different pain med.

## 2020-06-09 NOTE — Anesthesia Preprocedure Evaluation (Signed)
Anesthesia Evaluation  Patient identified by MRN, date of birth, ID band Patient awake    Reviewed: Allergy & Precautions, H&P , NPO status , Patient's Chart, lab work & pertinent test results, reviewed documented beta blocker date and time   Airway Mallampati: II  TM Distance: >3 FB Neck ROM: full    Dental no notable dental hx. (+) Teeth Intact   Pulmonary asthma , sleep apnea , COPD, former smoker,    Pulmonary exam normal breath sounds clear to auscultation       Cardiovascular Exercise Tolerance: Good hypertension, negative cardio ROS   Rhythm:regular Rate:Normal     Neuro/Psych PSYCHIATRIC DISORDERS Anxiety Depression negative neurological ROS     GI/Hepatic Neg liver ROS, GERD  Medicated,  Endo/Other  Morbid obesity  Renal/GU negative Renal ROS  negative genitourinary   Musculoskeletal   Abdominal   Peds  Hematology negative hematology ROS (+)   Anesthesia Other Findings   Reproductive/Obstetrics negative OB ROS                             Anesthesia Physical Anesthesia Plan  ASA: III  Anesthesia Plan: General and General ETT   Post-op Pain Management:    Induction:   PONV Risk Score and Plan: Ondansetron  Airway Management Planned:   Additional Equipment:   Intra-op Plan:   Post-operative Plan:   Informed Consent: I have reviewed the patients History and Physical, chart, labs and discussed the procedure including the risks, benefits and alternatives for the proposed anesthesia with the patient or authorized representative who has indicated his/her understanding and acceptance.     Dental Advisory Given  Plan Discussed with: CRNA  Anesthesia Plan Comments:         Anesthesia Quick Evaluation

## 2020-06-09 NOTE — Anesthesia Procedure Notes (Signed)
Procedure Name: Intubation Date/Time: 06/09/2020 9:52 AM Performed by: Karna Dupes, CRNA Pre-anesthesia Checklist: Patient identified, Emergency Drugs available, Suction available and Patient being monitored Patient Re-evaluated:Patient Re-evaluated prior to induction Oxygen Delivery Method: Circle system utilized Preoxygenation: Pre-oxygenation with 100% oxygen Induction Type: IV induction, Rapid sequence and Cricoid Pressure applied Laryngoscope Size: Mac and 3 Grade View: Grade I Tube type: Oral Tube size: 7.0 mm Number of attempts: 1 Airway Equipment and Method: Stylet and Oral airway Placement Confirmation: ETT inserted through vocal cords under direct vision,  positive ETCO2 and breath sounds checked- equal and bilateral Secured at: 21 cm Tube secured with: Tape Dental Injury: Teeth and Oropharynx as per pre-operative assessment

## 2020-06-09 NOTE — Discharge Instructions (Signed)
Endometrial Ablation Endometrial ablation is a procedure that destroys the thin inner layer of the lining of the uterus (endometrium). This procedure may be done:  To stop heavy menstrual periods.  To stop bleeding that is causing anemia.  To control irregular bleeding.  To treat bleeding caused by small tumors (fibroids) in the endometrium. This procedure is often done as an alternative to major surgery, such as removal of the uterus and cervix (hysterectomy). As a result of this procedure:  You may not be able to have children. However, if you have not yet gone through menopause: ? You may still have a small chance of getting pregnant. ? You will need to use a reliable method of birth control after the procedure to prevent pregnancy.  You may stop having a menstrual period, or you may have only a small amount of bleeding during your period. Menstruation may return several years after the procedure. Tell a health care provider about:  Any allergies you have.  All medicines you are taking, including vitamins, herbs, eye drops, creams, and over-the-counter medicines.  Any problems you or family members have had with the use of anesthetic medicines.  Any blood disorders you have.  Any surgeries you have had.  Any medical conditions you have.  Whether you are pregnant or may be pregnant. What are the risks? Generally, this is a safe procedure. However, problems may occur, including:  A hole (perforation) in the uterus or bowel.  Infection in the uterus, bladder, or vagina.  Bleeding.  Allergic reaction to medicines.  Damage to nearby structures or organs.  An air bubble in the lung (air embolus).  Problems with pregnancy.  Failure of the procedure.  Decreased ability to diagnose cancer in the endometrium. Scar tissue forms after the procedure, making it more difficult to get a sample of the uterine lining. What happens before the procedure? Medicines Ask your health  care provider about:  Changing or stopping your regular medicines. This is especially important if you take diabetes medicines or blood thinners.  Taking medicines such as aspirin and ibuprofen. These medicines can thin your blood. Do not take these medicines before your procedure if your doctor tells you not to take them.  Taking over-the-counter medicines, vitamins, herbs, and supplements. Tests  You will have tests of your endometrium to make sure there are no precancerous cells or cancer cells present.  You may have an ultrasound of the uterus. General instructions  Do not use any products that contain nicotine or tobacco for at least 4 weeks before the procedure. These include cigarettes, chewing tobacco, and vaping devices, such as e-cigarettes. If you need help quitting, ask your health care provider.  You may be given medicines to thin the endometrium.  Ask your health care provider what steps will be taken to help prevent infection. These steps may include: ? Removing hair at the surgery site. ? Washing skin with a germ-killing soap. ? Taking antibiotic medicine.  Plan to have a responsible adult take you home from the hospital or clinic.  Plan to have a responsible adult care for you for the time you are told after you leave the hospital or clinic. This is important. What happens during the procedure?  You will lie on an exam table with your feet and legs supported as in a pelvic exam.  An IV will be inserted into one of your veins.  You will be given a medicine to help you relax (sedative).  A surgical tool with   a light and camera (resectoscope) will be inserted into your vagina and moved into your uterus. This allows your surgeon to see inside your uterus.  Endometrial tissue will be destroyed and removed, using one of the following methods: ? Radiofrequency. This uses an electrical current to destroy the endometrium. ? Cryotherapy. This uses extreme cold to freeze  the endometrium. ? Heated fluid. This uses a heated salt and water (saline) solution to destroy the endometrium. ? Microwave. This uses high-energy microwaves to heat up the endometrium and destroy it. ? Thermal balloon. This involves inserting a catheter with a balloon tip into the uterus. The balloon tip is filled with heated fluid to destroy the endometrium. The procedure may vary among health care providers and hospitals.   What happens after the procedure?  Your blood pressure, heart rate, breathing rate, and blood oxygen level will be monitored until you leave the hospital or clinic.  You may have vaginal bleeding for 4-6 weeks after the procedure. You may also have: ? Cramps. ? A thin, watery vaginal discharge that is light pink or brown. ? A need to urinate more than usual. ? Nausea.  If you were given a sedative during the procedure, it can affect you for several hours. Do not drive or operate machinery until your health care provider says that it is safe.  Do not have sex or insert anything into your vagina until your health care provider says it is safe. Summary  Endometrial ablation is done to treat many causes of heavy menstrual bleeding. The procedure destroys the thin inner layer of the lining of the uterus (endometrium).  This procedure is often done as an alternative to major surgery, such as removal of the uterus and cervix (hysterectomy).  Plan to have a responsible adult take you home from the hospital or clinic. This information is not intended to replace advice given to you by your health care provider. Make sure you discuss any questions you have with your health care provider. Document Revised: 10/23/2019 Document Reviewed: 10/23/2019 Elsevier Patient Education  [place] Anesthesia, Adult, Care After This sheet gives you information about how to care for yourself after your procedure. Your health care provider may also give you more  specific instructions. If you have problems or questions, contact your health care provider. What can I expect after the procedure? After the procedure, the following side effects are common:  Pain or discomfort at the IV site.  Nausea.  Vomiting.  Sore throat.  Trouble concentrating.  Feeling cold or chills.  Feeling weak or tired.  Sleepiness and fatigue.  Soreness and body aches. These side effects can affect parts of the body that were not involved in surgery. Follow these instructions at home: For the time period you were told by your health care provider:  Rest.  Do not participate in activities where you could fall or become injured.  Do not drive or use machinery.  Do not drink alcohol.  Do not take sleeping pills or medicines that cause drowsiness.  Do not make important decisions or sign legal documents.  Do not take care of children on your own.   Eating and drinking  Follow any instructions from your health care provider about eating or drinking restrictions.  When you feel hungry, start by eating small amounts of foods that are soft and easy to digest (bland), such as toast. Gradually return to your regular diet.  Drink enough fluid to keep your  urine pale yellow.  If you vomit, rehydrate by drinking water, juice, or clear broth. General instructions  If you have sleep apnea, surgery and certain medicines can increase your risk for breathing problems. Follow instructions from your health care provider about wearing your sleep device: ? Anytime you are sleeping, including during daytime naps. ? While taking prescription pain medicines, sleeping medicines, or medicines that make you drowsy.  Have a responsible adult stay with you for the time you are told. It is important to have someone help care for you until you are awake and alert.  Return to your normal activities as told by your health care provider. Ask your health care provider what activities  are safe for you.  Take over-the-counter and prescription medicines only as told by your health care provider.  If you smoke, do not smoke without supervision.  Keep all follow-up visits as told by your health care provider. This is important. Contact a health care provider if:  You have nausea or vomiting that does not get better with medicine.  You cannot eat or drink without vomiting.  You have pain that does not get better with medicine.  You are unable to pass urine.  You develop a skin rash.  You have a fever.  You have redness around your IV site that gets worse. Get help right away if:  You have difficulty breathing.  You have chest pain.  You have blood in your urine or stool, or you vomit blood. Summary  After the procedure, it is common to have a sore throat or nausea. It is also common to feel tired.  Have a responsible adult stay with you for the time you are told. It is important to have someone help care for you until you are awake and alert.  When you feel hungry, start by eating small amounts of foods that are soft and easy to digest (bland), such as toast. Gradually return to your regular diet.  Drink enough fluid to keep your urine pale yellow.  Return to your normal activities as told by your health care provider. Ask your health care provider what activities are safe for you. This information is not intended to replace advice given to you by your health care provider. Make sure you discuss any questions you have with your health care provider. Document Revised: 12/18/2019 Document Reviewed: 07/17/2019 Elsevier Patient Education  2021 [place].     Oxycodone Capsules or Tablets What is this medicine? OXYCODONE (ox i KOE done) is a pain reliever, also called an opioid. It treats severe pain. This medicine may be used for other purposes; ask your health care provider or pharmacist if you have questions. COMMON BRAND NAME(S): Dazidox,  Endocodone, Oxaydo, OXECTA, OxyIR, Percolone, Roxicodone, Roxybond What should I tell my health care provider before I take this medicine? They need to know if you have any of these conditions:  brain tumor  drug abuse or addiction  head injury  heart disease  if you often drink alcohol  kidney disease  liver disease  low adrenal gland function  lung disease, asthma, or breathing problem  seizures  stomach or intestine problems  taken an MAOI such as Marplan, Nardil, or Parnate in the last 14 days  an unusual or allergic reaction to oxycodone, other drugs, foods, dyes, or preservatives  pregnant or trying to get pregnant  breast-feeding How should I use this medicine? Take this medicine by mouth with water. Take it as directed on the  prescription label at the same time every day. You can take it with or without food. If it upsets your stomach, take it with food. Keep taking it unless your health care provider tells you to stop. Some brands of this medicine, like Oxaydo, have special instructions. Ask your doctor or pharmacist if these directions are for you: Do not cut, crush or chew this medicine. Do not wet, soak, or lick the tablet before you take it. A special MedGuide will be given to you by the pharmacist with each prescription and refill. Be sure to read this information carefully each time. Talk to your pediatrician regarding the use of this medicine in children. Special care may be needed. Overdosage: If you think you have taken too much of this medicine contact a poison control center or emergency room at once. NOTE: This medicine is only for you. Do not share this medicine with others. What if I miss a dose? If you miss a dose, take it as soon as you can. If it is almost time for your next dose, take only that dose. Do not take double or extra doses. What may interact with this medicine? Do not take this medicine with any of the following  medications:  safinamide This medicine may interact with the following medications:  alcohol  antihistamines for allergy, cough, and cold  atropine  certain antivirals for HIV or hepatitis  certain antibiotics like clarithromycin, erythromycin, linezolid, rifampin  certain medicines for anxiety or sleep  certain medicines for bladder problems like oxybutynin, tolterodine  certain medicines for depression like amitriptyline, fluoxetine, sertraline  certain medicines for fungal infections like ketoconazole, itraconazole, posaconazole  certain medicines for migraine headache like almotriptan, eletriptan, frovatriptan, naratriptan, rizatriptan, sumatriptan, zolmitriptan  certain medicines for nausea or vomiting like dolasetron, granisetron, ondansetron, palonosetron  certain medicines for Parkinson's disease like benztropine, trihexyphenidyl  certain medicines for seizures like carbamazepine, phenobarbital, phenytoin, primidone  certain medicines for stomach problems like dicyclomine, hyoscyamine  certain medicines for travel sickness like scopolamine  diuretics  general anesthetics like halothane, isoflurane, methoxyflurane, propofol  ipratropium  MAOIs like Marplan, Nardil, and Parnate  medicines that relax muscles  methylene blue  other narcotic medicines for pain or cough  phenothiazines like chlorpromazine, mesoridazine, prochlorperazine, thioridazine This list may not describe all possible interactions. Give your health care provider a list of all the medicines, herbs, non-prescription drugs, or dietary supplements you use. Also tell them if you smoke, drink alcohol, or use illegal drugs. Some items may interact with your medicine. What should I watch for while using this medicine? Tell your health care provider if your pain does not go away, if it gets worse, or if you have new or a different type of pain. You may develop tolerance to this medicine. Tolerance  means that you will need a higher dose of the medicine for pain relief. Tolerance is normal and is expected if you take this medicine for a long time. Do not suddenly stop taking your medicine because you may develop a severe reaction. Your body becomes used to the medicine. This does NOT mean you are addicted. Addiction is a behavior related to getting and using a medicine for a nonmedical reason. If you have pain, you have a medical reason to take pain medicine. Your health care provider will tell you how much medicine to take. If your health care provider wants you to stop the medicine, the dose will be slowly lowered over time to avoid any side effects. If  you take other medicines that also cause drowsiness such as other narcotic pain medicines, benzodiazepines, or other medicines for sleep, you may have more side effects. Give your health care provider a list of all medicines you use. He or she will tell you how much medicine to take. Do not take more medicine than directed. Get emergency help right away if you have trouble breathing or are unusually tired or sleepy. Talk to your health care provider about naloxone and how to get it. Naloxone is an emergency medicine used for an opioid overdose. An overdose can happen if you take too much opioid. It can also happen if an opioid is taken with some other medicines or substances, such as alcohol. Know the symptoms of an overdose, such as trouble breathing, unusually tired or sleepy, or not being able to respond or wake up. Make sure to tell caregivers and close contacts where it is stored. Make sure they know how to use it. After naloxone is given, you must get emergency help right away. Naloxone is a temporary treatment. Repeat doses may be needed. You may get drowsy or dizzy. Do not drive, use machinery, or do anything that needs mental alertness until you know how this medicine affects you. Do not stand up or sit up quickly, especially if you are an older  patient. This reduces the risk of dizzy or fainting spells. Alcohol may interfere with the effect of this medicine. Avoid alcoholic drinks. This medicine will cause constipation. If you do not have a bowel movement for 3 days, call your health care provider. Your mouth may get dry. Chewing sugarless gum or sucking hard candy and drinking plenty of water may help. Contact your health care provider if the problem does not go away or is severe. What side effects may I notice from receiving this medicine? Side effects that you should report to your doctor or health care professional as soon as possible:  allergic reactions (skin rash, itching or hives; swelling of the face, lips, or tongue)  confusion  kidney injury (trouble passing urine or change in the amount of urine)  low adrenal gland function (nausea; vomiting; loss of appetite; unusually weak or tired; dizziness; low blood pressure)  low blood pressure (dizziness; feeling faint or lightheaded, falls; unusually weak or tired)  serotonin syndrome (irritable; confusion; diarrhea; fast or irregular heartbeat; muscle twitching; stiff muscles; trouble walking; sweating; high fever; seizures; chills; vomiting)  trouble breathing Side effects that usually do not require medical attention (report to your doctor or health care professional if they continue or are bothersome):  constipation  dry mouth  nausea, vomiting  tiredness This list may not describe all possible side effects. Call your doctor for medical advice about side effects. You may report side effects to FDA at 1-800-FDA-1088. Where should I keep my medicine? Keep out of the reach of children and pets. This medicine can be abused. Keep it in a safe place to protect it from theft. Do not share it with anyone. It is only for you. Selling or giving away this medicine is dangerous and against the law. Store at [employer] C (77 degrees F). Protect from light and moisture. Keep the  container tightly closed. Get rid of any unused medicine after the expiration date. This medicine may cause harm and death if it is taken by other adults, children, or pets. It is important to get rid of the medicine as soon as you no longer need it or it is expired.  You can do this in two ways:  Take the medicine to a medicine take-back program. Check with your pharmacy or law enforcement to find a location.  If you cannot return the medicine, flush it down the toilet. NOTE: This sheet is a summary. It may not cover all possible information. If you have questions about this medicine, talk to your doctor, pharmacist, or health care provider.  2021 Elsevier/[employer] Standard (2020-02-23 13:26:06)

## 2020-06-09 NOTE — Anesthesia Postprocedure Evaluation (Signed)
Anesthesia Post Note  Patient: Kathryn [name]  Procedure(s) Performed: DILATATION AND CURETTAGE/HYSTEROSCOPY WITH MINERVA (N/A Vagina )  Patient location during evaluation: PACU Anesthesia Type: General Level of consciousness: awake and alert and oriented Pain management: satisfactory to patient Vital Signs Assessment: post-procedure vital signs reviewed and stable Respiratory status: spontaneous breathing and respiratory function stable Cardiovascular status: stable Postop Assessment: no apparent nausea or vomiting and adequate PO intake Anesthetic complications: no   No complications documented.   Last Vitals:  Vitals:   06/09/20 1030 06/09/20 1045  BP: 123/83 121/89  Pulse: 81 85  Resp: 13 18  Temp: 36.9 C   SpO2: 97% 97%    Last Pain:  Vitals:   06/09/20 1045  TempSrc:   PainSc: 10-Worst pain ever                 Karna Dupes

## 2020-06-09 NOTE — H&P (Signed)
Kathryn [name] is a 50 y.o. [phone] with Patient's last menstrual period was 04/29/2020 (exact date). admitted for a hysteroscopy uterine curettage Minerva endometrial ablation 06/09/20.  Pt has had poor response to megestrol No submucosal fibroids, small subserosal fibroids so should be a good candidate for the ablation and patient wants to proceed with that  PMH:        Past Medical History:  Diagnosis Date  . Anxiety   . Arthritis   . Asthma   . Chronic back pain   . COPD (chronic obstructive pulmonary disease) (HCC)    Chronic bronchitis  . Depression   . GERD (gastroesophageal reflux disease)   . Hypertension   . Neuropathy   . Osteoarthritis of left knee, 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  . Suicidal ideation 12/24/2018    PSH:          Past Surgical History:  Procedure Laterality Date  . ESOPHAGOGASTRODUODENOSCOPY (EGD) WITH PROPOFOL N/A 12/25/2018   Procedure: ESOPHAGOGASTRODUODENOSCOPY (EGD) WITH PROPOFOL;  Surgeon: Rogene Houston, MD;  Location: [facility];  Service: Endoscopy;  Laterality: N/A;  . PATELLA-FEMORAL ARTHROPLASTY Left 10/08/2018   Procedure: PATELLA-FEMORAL ARTHROPLASTY;  Surgeon: Marchia Bond, MD;  Location: [facility];  Service: Orthopedics;  Laterality: Left;  . TUBAL LIGATION      POb/GynH:              OB History    Gravida  5   Para  3   Term  2   Preterm  1   AB  2   Living  3     SAB  1   IAB  1   Ectopic      Multiple      Live Births              SH:   Social History        Tobacco Use  . Smoking status: Former Smoker    Types: Cigarettes    Quit date: 2016    Years since quitting: 6.0  . Smokeless tobacco: Never Used  . Tobacco comment: wears nicotine patches  Vaping Use  . Vaping Use: Never used  Substance Use Topics  . Alcohol use: Not Currently  . Drug use: Not Currently    Types: Cocaine    Comment: crack  last used 2016    FH:         Family History  Problem  Relation Age of Onset  . Gout Paternal Grandfather   . Cirrhosis Paternal Grandfather   . Hypertension Paternal Grandmother   . Aneurysm Paternal Grandmother   . Cirrhosis Maternal Grandmother   . Cirrhosis Maternal Grandfather   . Cancer Father   . Cirrhosis Father   . Cirrhosis Mother   . Breast cancer Sister   . Hypertension Sister   . Bronchitis Daughter   . Bronchitis Daughter   . Asthma Son   . Bronchitis Son   . Migraines Neg Hx      Allergies:       Allergies  Allergen Reactions  . Clonopin [Clonazepam] Anaphylaxis    Swelling of the tongue and mouth.   . Fish Allergy Anaphylaxis, Shortness Of Breath and Swelling  . Flexeril [Cyclobenzaprine Hcl] Shortness Of Breath  . Ibuprofen Anaphylaxis and Hives  . Shellfish Allergy Anaphylaxis  . Ace Inhibitors Cough  . Tramadol Nausea And Vomiting    Upset stomach    Medications:       Current Outpatient Medications:  .  albuterol (PROVENTIL HFA;VENTOLIN HFA) 108 (90 Base) MCG/ACT inhaler, Inhale 2 puffs into the lungs 2 (two) times daily., Disp: , Rfl:  .  albuterol (PROVENTIL) (2.5 MG/3ML) 0.083% nebulizer solution, Take 2.5 mg by nebulization every 6 (six) hours as needed for wheezing or shortness of breath., Disp: , Rfl:  .  amLODipine (NORVASC) 5 MG tablet, Take 5 mg by mouth daily., Disp: , Rfl:  .  budesonide-formoterol (SYMBICORT) 80-4.5 MCG/ACT inhaler, Inhale 2 puffs into the lungs 2 (two) times daily., Disp: 1 each, Rfl: 5 .  celecoxib (CELEBREX) 100 MG capsule, Take 100 mg by mouth 2 (two) times daily., Disp: , Rfl:  .  citalopram (CELEXA) 20 MG tablet, Take 20 mg by mouth daily., Disp: , Rfl:  .  cloNIDine (CATAPRES) 0.1 MG tablet, Take 0.1 mg by mouth daily., Disp: , Rfl:  .  HYDROcodone-acetaminophen (NORCO/VICODIN) 5-325 MG tablet, Take 1 tablet by mouth 3 (three) times daily as needed., Disp: , Rfl:  .  hydrOXYzine (VISTARIL) 50 MG capsule, Take 1 capsule (50 mg total) by mouth  daily as needed for anxiety (Insomnia)., Disp: 30 capsule, Rfl: 5 .  megestrol (MEGACE) 40 MG tablet, Take 1 tablet (40 mg total) by mouth daily., Disp: 15 tablet, Rfl: 0 .  omeprazole (PRILOSEC) 20 MG capsule, Take 20 mg by mouth daily., Disp: , Rfl:  .  pregabalin (LYRICA) 75 MG capsule, Take 1 capsule (75 mg total) by mouth 2 (two) times daily., Disp: 60 capsule, Rfl: 5 .  tiZANidine (ZANAFLEX) 2 MG tablet, Take 2 mg by mouth 2 (two) times daily as needed for muscle spasms., Disp: , Rfl:  .  torsemide (DEMADEX) 5 MG tablet, Take 5 mg by mouth daily., Disp: , Rfl:   Review of Systems:   Review of Systems  Constitutional: Negative for fever, chills, weight loss, malaise/fatigue and diaphoresis.  HENT: Negative for hearing loss, ear pain, nosebleeds, congestion, sore throat, neck pain, tinnitus and ear discharge.   Eyes: Negative for blurred vision, double vision, photophobia, pain, discharge and redness.  Respiratory: Negative for cough, hemoptysis, sputum production, shortness of breath, wheezing and stridor.   Cardiovascular: Negative for chest pain, palpitations, orthopnea, claudication, leg swelling and PND.  Gastrointestinal: Positive for abdominal pain. Negative for heartburn, nausea, vomiting, diarrhea, constipation, blood in stool and melena.  Genitourinary: Negative for dysuria, urgency, frequency, hematuria and flank pain.  Musculoskeletal: Negative for myalgias, back pain, joint pain and falls.  Skin: Negative for itching and rash.  Neurological: Negative for dizziness, tingling, tremors, sensory change, speech change, focal weakness, seizures, loss of consciousness, weakness and headaches.  Endo/Heme/Allergies: Negative for environmental allergies and polydipsia. Does not bruise/bleed easily.  Psychiatric/Behavioral: Negative for depression, suicidal ideas, hallucinations, memory loss and substance abuse. The patient is not nervous/anxious and does not have insomnia.       PHYSICAL EXAM:  Blood pressure (!) 145/88, pulse 78, weight 188 lb (85.3 kg), last menstrual period 04/29/2020.    Vitals reviewed. Constitutional: She is oriented to person, place, and time. She appears well-developed and well-nourished.  HENT:  Head: Normocephalic and atraumatic.  Right Ear: External ear normal.  Left Ear: External ear normal.  Nose: Nose normal.  Mouth/Throat: Oropharynx is clear and moist.  Eyes: Conjunctivae and EOM are normal. Pupils are equal, round, and reactive to light. Right eye exhibits no discharge. Left eye exhibits no discharge. No scleral icterus.  Neck: Normal range of motion. Neck supple. No tracheal deviation present. No thyromegaly present.  Cardiovascular: Normal  rate, regular rhythm, normal heart sounds and intact distal pulses.  Exam reveals no gallop and no friction rub.   No murmur heard. Respiratory: Effort normal and breath sounds normal. No respiratory distress. She has no wheezes. She has no rales. She exhibits no tenderness.  GI: Soft. Bowel sounds are normal. She exhibits no distension and no mass. There is tenderness. There is no rebound and no guarding.  Genitourinary:       Vulva is normal without lesions Vagina is pink moist without discharge Cervix normal in appearance and pap is normal Uterus is normal size, contour, position, consistency, mobility, non-tender Adnexa is negative with normal sized ovaries by sonogram  Musculoskeletal: Normal range of motion. She exhibits no edema and no tenderness.  Neurological: She is alert and oriented to person, place, and time. She has normal reflexes. She displays normal reflexes. No cranial nerve deficit. She exhibits normal muscle tone. Coordination normal.  Skin: Skin is warm and dry. No rash noted. No erythema. No pallor.  Psychiatric: She has a normal mood and affect. Her behavior is normal. Judgment and thought content normal.    Labs: Results for orders placed or  performed during the hospital encounter of 06/07/20 (from the past 72 hour(s))  CBC     Status: Abnormal   Collection Time: 06/07/20  9:59 AM  Result Value Ref Range   WBC 8.6 4.0 - 10.5 K/uL   RBC 4.11 3.87 - 5.11 MIL/uL   Hemoglobin 12.2 12.0 - 15.0 g/dL   HCT 37.9 36.0 - 46.0 %   MCV 92.2 80.0 - 100.0 fL   MCH 29.7 26.0 - 34.0 pg   MCHC 32.2 30.0 - 36.0 g/dL   RDW 14.5 11.5 - 15.5 %   Platelets 584 (H) 150 - 400 K/uL   nRBC 0.0 0.0 - 0.2 %    Comment: Performed at [facility], [address], [place], [place] [address]  Comprehensive metabolic panel     Status: Abnormal   Collection Time: 06/07/20  9:59 AM  Result Value Ref Range   Sodium 139 135 - 145 mmol/L   Potassium 4.1 3.5 - 5.1 mmol/L   Chloride 107 98 - 111 mmol/L   CO2 22 22 - 32 mmol/L   Glucose, Bld 96 70 - 99 mg/dL    Comment: Glucose reference range applies only to samples taken after fasting for at least 8 hours.   BUN 21 (H) 6 - 20 mg/dL   Creatinine, Ser 0.86 0.44 - 1.00 mg/dL   Calcium 9.1 8.9 - 10.3 mg/dL   Total Protein 7.5 6.5 - 8.1 g/dL   Albumin 3.5 3.5 - 5.0 g/dL   AST 16 15 - 41 U/L   ALT 18 0 - 44 U/L   Alkaline Phosphatase 66 38 - 126 U/L   Total Bilirubin 0.4 0.3 - 1.2 mg/dL   GFR, Estimated >60 >60 mL/min    Comment: (NOTE) Calculated using the CKD-EPI Creatinine Equation (2021)    Anion gap 10 5 - 15    Comment: Performed at [facility], [address], [place], [place] [address]  hCG, quantitative, pregnancy     Status: None   Collection Time: 06/07/20  9:59 AM  Result Value Ref Range   hCG, Beta Chain, Quant, S <1 <5 mIU/mL    Comment:          GEST. AGE      CONC.  (mIU/mL)   <=1 WEEK  5 - 50     2 WEEKS       50 - 500     3 WEEKS       100 - 10,000     4 WEEKS     1,000 - 30,000     5 WEEKS     3,500 - 115,000   6-8 WEEKS     12,000 - 270,000    12 WEEKS     15,000 - 220,000        FEMALE AND NON-PREGNANT FEMALE:     LESS THAN 5 mIU/mL Performed at [facility], [address], [place], [place] [address]   SARS CORONAVIRUS 2 (TAT 6-24 HRS) Nasopharyngeal Nasopharyngeal Swab     Status: None   Collection Time: 06/07/20 10:02 AM   Specimen: Nasopharyngeal Swab  Result Value Ref Range   SARS Coronavirus 2 NEGATIVE NEGATIVE    Comment: (NOTE) SARS-CoV-2 target nucleic acids are NOT DETECTED.  The SARS-CoV-2 RNA is generally detectable in upper and lower respiratory specimens during the acute phase of infection. Negative results do not preclude SARS-CoV-2 infection, do not rule out co-infections with other pathogens, and should not be used as the sole basis for treatment or other patient management decisions. Negative results must be combined with clinical observations, patient history, and epidemiological information. The expected result is Negative.  Fact Sheet for Patients: SugarRoll.be  Fact Sheet for Healthcare Providers: https://www.woods-mathews.com/  This test is not yet approved or cleared by the [place] FDA and  has been authorized for detection and/or diagnosis of SARS-CoV-2 by FDA under an Emergency Use Authorization (EUA). This EUA will remain  in effect (meaning this test can be used) for the duration of the COVID-19 declaration under Se ction 564(b)(1) of the Act, 21 U.S.C. section 360bbb-3(b)(1), unless the authorization is terminated or revoked sooner.  Performed at [facility], [place] [address], [place], [place] [address]     EKG:    Orders placed or performed during the hospital encounter of 12/24/18  . EKG 12-Lead  . EKG 12-Lead  . EKG    Imaging Studies:  Imaging Results  US PELVIS TRANSVAGINAL NON-OB (TV ONLY)  Result Date: 05/13/2020  [facility] [address] [place] [address]                                                                                                                                   GYNECOLOGIC  SONOGRAM INA SCRIVENS is a 50 y.o. [id number] LMP 03/29/2020,She is here for a pelvic sonogram for menorrhagia,dysmenorrhea . Uterus                      9.4 x 5.5 x 6.6 cm, Total uterine volume 176 cc, heterogeneous anteverted uterus with multiple fibroids,(#1)posterior subserosal fibroid 4.1 x 3.3 x 2.6 cm,(#2) anterior subserosal fibroid 3 x  2.2 x 2.9 cm,(#3) fundal intramural fibroid 2 x 1.4 x 1.8 cm Endometrium          10.4 mm, symmetrical, wnl Right ovary             2.5 x 2.8 x 1.8 cm, wnl Left ovary                2.1 x 1.7 x 2.3 cm, wnl No free fluid Technician Comments: PELVIC US TA/TV: heterogeneous anteverted uterus with multiple fibroids,(#1)posterior subserosal fibroid 4.1 x 3.3 x 2.6 cm,(#2) anterior subserosal fibroid 3 x 2.2 x 2.9 cm,(#3) fundal intramural fibroid 2 x 1.4 x 1.8 cm,EEC 10.4 mm,normal ovaries,ovaries appear mobile,no free fluid,multiple small nabothian cysts,largest complex nabothian cyst 1.2 cm Chaperone [phone] Heide Guile 05/13/2020 3:15 PM Clinical Impression and recommendations: I have reviewed the sonogram results above, combined with the patient's current clinical course, below are my impressions and any appropriate recommendations for management based on the sonographic findings. Uterus mildly enlarge with small fibroids present, no submucosal Endometrium is normal Both ovaries are normal Florian Buff 05/13/2020 4:26 PM   US PELVIS (TRANSABDOMINAL ONLY)  Result Date: 05/13/2020  [facility] [place] [address] [place] [address]                                                                                                                                   GYNECOLOGIC SONOGRAM MISTY FOUTZ is a 50 y.o. [id number] LMP 03/29/2020,She is here for a pelvic sonogram for menorrhagia,dysmenorrhea . Uterus                      9.4 x 5.5 x 6.6 cm, Total uterine volume 176 cc, heterogeneous anteverted uterus with multiple fibroids,(#1)posterior  subserosal fibroid 4.1 x 3.3 x 2.6 cm,(#2) anterior subserosal fibroid 3 x 2.2 x 2.9 cm,(#3) fundal intramural fibroid 2 x 1.4 x 1.8 cm Endometrium          10.4 mm, symmetrical, wnl Right ovary             2.5 x 2.8 x 1.8 cm, wnl Left ovary                2.1 x 1.7 x 2.3 cm, wnl No free fluid Technician Comments: PELVIC US TA/TV: heterogeneous anteverted uterus with multiple fibroids,(#1)posterior subserosal fibroid 4.1 x 3.3 x 2.6 cm,(#2) anterior subserosal fibroid 3 x 2.2 x 2.9 cm,(#3) fundal intramural fibroid 2 x 1.4 x 1.8 cm,EEC 10.4 mm,normal ovaries,ovaries appear mobile,no free fluid,multiple small nabothian cysts,largest complex nabothian cyst 1.2 cm Chaperone [id number] Heide Guile 05/13/2020 3:15 PM Clinical Impression and recommendations: I have reviewed the sonogram results above, combined with the patient's current clinical course, below are my impressions and any appropriate recommendations for management based on the sonographic findings. Uterus mildly enlarge with small fibroids present, no submucosal Endometrium is normal Both  ovaries are normal Florian Buff 05/13/2020 4:26 PM       Assessment: Menometrorrhagia Dysmenorrhea Small fibroids, not sub mucosal Poor response to megestrol  Plan: Hysteroscopy uterine curettage Minerva endometrial ablation 06/09/20  Florian Buff 05/13/2020 4:27 PM

## 2020-06-09 NOTE — Progress Notes (Signed)
Pt states tylenol makes her throat swell, is not on list of allergies but will add. Will call Dr Elonda Husky  As her pain med has tylenol in it.

## 2020-06-09 NOTE — Op Note (Signed)
Preoperative diagnosis:  1.   menometrorrhaiga                                         2.  dysmenorrhea   Postoperative diagnoses: Same as above   Procedure: Hysteroscopy, uterine curettage, endometrial ablation using Minerva  Surgeon: Florian Buff   Anesthesia: Laryngeal mask airway  Findings: The endometrium was normal. There were no fibroid or other abnormalities.  Description of operation: The patient was taken to the operating room and placed in the supine position. She underwent general anesthesia using the laryngeal mask airway. She was placed in the dorsal lithotomy position and prepped and draped in the usual sterile fashion. A Graves speculum was placed and the anterior cervical lip was grasped with a single-tooth tenaculum. The cervix was dilated serially to allow passage of the hysteroscope. Diagnostic hysteroscopy was performed and was found to be normal. A vigorous uterine curettage was then performed and all tissue sent to pathology for evaluation.  I then proceeded to perform the Minerva endometrial ablation.   The uterus sounded to 9 cm The handpiece was attached to the Minerva power source/machine and the handpiece passed the checklist. The array was squeezed down to remove all of the air present.  The array was then place into the endometrial cavity and deployed to a length of 6 cm. The handpiece confirmed appropriate width by being in the green portion of the visual dial. The cervical cuff was then inflated to the point the CO2 indicator was in the green. The endometrial integrity check was then performed and integrity sequence was confirmed x 2. The heating was then begun and carried out for a total of 2 minutes(which is standard therapy time). When the plasma cycle was finished,  the cervical cuff was deflated and the array was removed with tissue present on the silicon membrane. There was appropriate post Minerva bleeding and uterine discharge.     All of the  equipment worked well throughout the procedure.  The patient was awakened from anesthesia and taken to the recovery room in good stable condition all counts were correct. She received 2 g of Ancef preoperatively. She will be discharged from the recovery room and followed up in the office in 1- 2 weeks.   She can expect 4 weeks of post procedure bloody watery discharge  Florian Buff, MD  06/09/2020 10:22 AM

## 2020-06-10 ENCOUNTER — Encounter (HOSPITAL_COMMUNITY): Payer: Self-pay | Admitting: Obstetrics & Gynecology

## 2020-06-10 LAB — SURGICAL PATHOLOGY

## 2020-06-14 ENCOUNTER — Telehealth: Payer: Self-pay

## 2020-06-14 NOTE — Telephone Encounter (Signed)
Pt says she is going to call them now and have them send over the orders.

## 2020-06-14 NOTE — Telephone Encounter (Signed)
They usually send orders to me for cosigning. I have not received it yet. Please confirm with the patient and then okay to order. Thank you.

## 2020-06-14 NOTE — Telephone Encounter (Signed)
Pt was to have a mammogram but she was having a lot of pain in her breasts so she was unable to have that done. They recommended her get a diagnostic of her L breast and an ultrasound of her R breast at [facility]. Do we need to order this? Please advise.

## 2020-06-15 ENCOUNTER — Other Ambulatory Visit: Payer: Self-pay | Admitting: Obstetrics & Gynecology

## 2020-06-15 ENCOUNTER — Other Ambulatory Visit: Payer: Self-pay

## 2020-06-15 ENCOUNTER — Telehealth: Payer: Self-pay

## 2020-06-15 ENCOUNTER — Telehealth: Payer: Self-pay | Admitting: Obstetrics & Gynecology

## 2020-06-15 DIAGNOSIS — Z1231 Encounter for screening mammogram for malignant neoplasm of breast: Secondary | ICD-10-CM

## 2020-06-15 MED ORDER — OXYCODONE HCL 5 MG PO TABS
5.0000 mg | ORAL_TABLET | ORAL | 0 refills | Status: DC | PRN
Start: 2020-06-15 — End: 2020-09-02

## 2020-06-15 NOTE — Telephone Encounter (Signed)
Patient called stating that she would like for Dr. Elonda Husky to call her in Oxycodone for her pain, patient has surgery yesterday. Please contact pt when done.

## 2020-06-15 NOTE — Telephone Encounter (Signed)
Pt is requesting medication from her surgery, ondansetron and oxycodone

## 2020-06-15 NOTE — Telephone Encounter (Signed)
Called pt to inform her on new script for pain meds. Pt will be seen in office on Friday, 06/18/20.

## 2020-06-15 NOTE — Telephone Encounter (Signed)
Returned pt's call to clarify complaints. Pt stated that she is having 10/10 upper abdominal and vaginal pain since her surgery on 2/23. She has some spotting still. She said, "The pain hits and she almost passes out." She states that she is unable to take any OTC meds like Tylenol or Advil for the pain. She has a post-op follow-up on 3/4.

## 2020-06-15 NOTE — Telephone Encounter (Signed)
Oxycodone #15 e prescribed

## 2020-06-16 ENCOUNTER — Other Ambulatory Visit: Payer: Self-pay | Admitting: Internal Medicine

## 2020-06-16 DIAGNOSIS — N644 Mastodynia: Secondary | ICD-10-CM

## 2020-06-17 ENCOUNTER — Telehealth: Payer: Self-pay | Admitting: Internal Medicine

## 2020-06-17 NOTE — Telephone Encounter (Signed)
   Telephone encounter was:  Successful.  06/17/2020 Name: FREDDI FORSTER MRN: [id number] DOB: [birth year]  LYLIANNA FRAISER is a 50 y.o. year old female who is a primary care patient of Lindell Spar, MD . The community resource team was consulted for assistance with Transportation Needs   Care guide performed the following interventions: Discussed resources to assist with transportation. Patient asked if Care Guide can give her a call tomorow (06/18/2020) to discuss her transportaiton needs. .  Follow Up Plan:  Care guide will follow up with patient by phone over the next week.   [employer], Care Management Phone: [phone] Email: [email or web address]

## 2020-06-18 ENCOUNTER — Encounter: Payer: Self-pay | Admitting: Obstetrics & Gynecology

## 2020-06-18 ENCOUNTER — Other Ambulatory Visit: Payer: Self-pay

## 2020-06-18 ENCOUNTER — Ambulatory Visit (INDEPENDENT_AMBULATORY_CARE_PROVIDER_SITE_OTHER): Payer: Medicaid Other | Admitting: Obstetrics & Gynecology

## 2020-06-18 VITALS — BP 103/68 | HR 74 | Wt 189.0 lb

## 2020-06-18 DIAGNOSIS — Z9889 Other specified postprocedural states: Secondary | ICD-10-CM

## 2020-06-18 NOTE — Progress Notes (Signed)
HPI: Patient returns for routine postoperative follow-up having undergone hysteroscopy uterine curettage on 06/09/20.  The patient's immediate postoperative recovery has been unremarkable. Since hospital discharge the patient reports crsamping.   Current Outpatient Medications: albuterol (PROVENTIL HFA;VENTOLIN HFA) 108 (90 Base) MCG/ACT inhaler, Inhale 2 puffs into the lungs every 6 (six) hours as needed for wheezing or shortness of breath., Disp: , Rfl:  albuterol (PROVENTIL) (2.5 MG/3ML) 0.083% nebulizer solution, Take 2.5 mg by nebulization every 6 (six) hours as needed for wheezing or shortness of breath., Disp: , Rfl:  amLODipine (NORVASC) 5 MG tablet, Take 5 mg by mouth daily., Disp: , Rfl:  Boric Acid Vaginal 600 MG SUPP, Place 600 mg vaginally as directed. Place one capsule inside the vagina at bedtime every other night for 3 doses. Use once a week at bedtime thereafter for foul odor., Disp: 30 suppository, Rfl: 0 budesonide-formoterol (SYMBICORT) 80-4.5 MCG/ACT inhaler, Inhale 2 puffs into the lungs 2 (two) times daily., Disp: 1 each, Rfl: 5 celecoxib (CELEBREX) 200 MG capsule, Take 1 capsule (200 mg total) by mouth 2 (two) times daily. For pain, Disp: 20 capsule, Rfl: 0 citalopram (CELEXA) 20 MG tablet, Take 20 mg by mouth daily., Disp: , Rfl:  cloNIDine (CATAPRES) 0.1 MG tablet, TAKE 1 TABLET BY MOUTH ONCE DAILY FOR BLOOD PRESSURE AND ANXIETY., Disp: 90 tablet, Rfl: 0 hydrOXYzine (VISTARIL) 50 MG capsule, Take 1 capsule (50 mg total) by mouth daily as needed for anxiety (Insomnia). (Patient taking differently: Take 50 mg by mouth at bedtime.), Disp: 30 capsule, Rfl: 5 methocarbamol (ROBAXIN) 500 MG tablet, Take 500 mg by mouth in the morning and at bedtime., Disp: , Rfl:  omeprazole (PRILOSEC) 20 MG capsule, Take 20 mg by mouth daily., Disp: , Rfl:  ondansetron (ZOFRAN ODT) 4 MG disintegrating tablet, Take 1 tablet (4 mg total) by mouth every 8 (eight) hours as needed for nausea or  vomiting., Disp: 20 tablet, Rfl: 0 ondansetron (ZOFRAN ODT) 8 MG disintegrating tablet, Take 1 tablet (8 mg total) by mouth every 8 (eight) hours as needed for nausea or vomiting., Disp: 8 tablet, Rfl: 0 oxyCODONE (OXY IR/ROXICODONE) 5 MG immediate release tablet, Take 1 tablet (5 mg total) by mouth every 4 (four) hours as needed for severe pain., Disp: 10 tablet, Rfl: 0 oxyCODONE (ROXICODONE) 5 MG immediate release tablet, Take 1 tablet (5 mg total) by mouth every 4 (four) hours as needed for severe pain., Disp: 15 tablet, Rfl: 0 pregabalin (LYRICA) 75 MG capsule, Take 1 capsule (75 mg total) by mouth 2 (two) times daily., Disp: 60 capsule, Rfl: 5 tiZANidine (ZANAFLEX) 2 MG tablet, Take 2 mg by mouth 2 (two) times daily as needed for muscle spasms., Disp: , Rfl:  torsemide (DEMADEX) 5 MG tablet, Take 5 mg by mouth daily., Disp: , Rfl:  Vitamin D, Ergocalciferol, (DRISDOL) 1.25 MG (50000 UNIT) CAPS capsule, Take 50,000 Units by mouth every 7 (seven) days., Disp: , Rfl:   No current facility-administered medications for this visit.    Blood pressure 103/68, pulse 74, weight 189 lb (85.7 kg), last menstrual period 06/07/2020.  Physical Exam: General WDWN female NAD Vulva:  normal appearing vulva with no masses, tenderness or lesions Vagina:  normal mucosa, no discharge Cervix:  Normal no lesions Uterus:  normal size, contour, position, consistency, mobility, non-tender Adnexa: ovaries:present,  normal adnexa in size, nontender and no masses   Diagnostic Tests:   Pathology: benign  Impression:     ICD-10-CM   1. S/P endometrial ablation, 06/09/20  H60.677       Plan:     Follow up:  as needed  Florian Buff, MD

## 2020-06-21 ENCOUNTER — Telehealth: Payer: Self-pay | Admitting: Internal Medicine

## 2020-06-21 ENCOUNTER — Encounter: Payer: Self-pay | Admitting: Internal Medicine

## 2020-06-21 ENCOUNTER — Ambulatory Visit: Payer: Medicaid Other | Admitting: Internal Medicine

## 2020-06-21 ENCOUNTER — Other Ambulatory Visit: Payer: Self-pay | Admitting: Internal Medicine

## 2020-06-21 ENCOUNTER — Other Ambulatory Visit: Payer: Self-pay

## 2020-06-21 ENCOUNTER — Other Ambulatory Visit: Payer: Self-pay | Admitting: Neurosurgery

## 2020-06-21 VITALS — BP 106/71 | HR 80 | Temp 98.0°F | Resp 18 | Ht 59.0 in | Wt 193.1 lb

## 2020-06-21 DIAGNOSIS — Z1231 Encounter for screening mammogram for malignant neoplasm of breast: Secondary | ICD-10-CM

## 2020-06-21 DIAGNOSIS — N644 Mastodynia: Secondary | ICD-10-CM

## 2020-06-21 DIAGNOSIS — R0782 Intercostal pain: Secondary | ICD-10-CM

## 2020-06-21 NOTE — Patient Instructions (Signed)
Please take Tylenol to help with the pain. Your pain is most likely related to muscular pain.  You are being scheduled to get Mammography done.

## 2020-06-21 NOTE — Telephone Encounter (Signed)
   Telephone encounter was:  Unsuccessful.  06/21/2020 Name: Kathryn [name] MRN: [id number] DOB: 06/10/1970  Unsuccessful outbound call made today to assist with:  Transportation Needs   Outreach Attempt:  2nd Attempt  Could not leave patient an message due to voicemail being full. Care Guide will try to give patient a call back with in the week.   [employer], Care Management Phone: [phone] Email: [email or web address]

## 2020-06-21 NOTE — Progress Notes (Signed)
Acute Office Visit  Subjective:    Patient ID: Kathryn [name], female    DOB: 01/16/1971, 50 y.o.   MRN: [id number]  Chief Complaint  Patient presents with  . Breast Pain    Left breast pain started last night very intense shooting pains calmed down and is now back again     HPI Patient is in today for evaluation of left sided breast/chest pain, which is intermittent, sharp, nonradiating without any exacerbating or alleviating factors. She denies any dizziness, dyspnea or palpitations. Her BP and HR were stable today. She has mild chest wall tenderness. She has not had Mammography done yet.  Past Medical History:  Diagnosis Date  . Anxiety   . Arthritis   . Asthma   . Chronic back pain   . COPD (chronic obstructive pulmonary disease) (HCC)    Chronic bronchitis  . Depression   . GERD (gastroesophageal reflux disease)   . Hypertension   . Neuropathy   . Osteoarthritis of left knee, 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  . Sleep apnea 04/2020   GETTING A cpap  . Suicidal ideation 12/24/2018    Past Surgical History:  Procedure Laterality Date  . DILATATION AND CURETTAGE/HYSTEROSCOPY WITH MINERVA N/A 06/09/2020   Procedure: DILATATION AND CURETTAGE/HYSTEROSCOPY WITH MINERVA;  Surgeon: Florian Buff, MD;  Location: [facility];  Service: Gynecology;  Laterality: N/A;  . ESOPHAGOGASTRODUODENOSCOPY (EGD) WITH PROPOFOL N/A 12/25/2018   Procedure: ESOPHAGOGASTRODUODENOSCOPY (EGD) WITH PROPOFOL;  Surgeon: Rogene Houston, MD;  Location: [facility];  Service: Endoscopy;  Laterality: N/A;  . PATELLA-FEMORAL ARTHROPLASTY Left 10/08/2018   Procedure: PATELLA-FEMORAL ARTHROPLASTY;  Surgeon: Marchia Bond, MD;  Location: [facility];  Service: Orthopedics;  Laterality: Left;  . TUBAL LIGATION      Family History  Problem Relation Age of Onset  . Gout Paternal Grandfather   . Cirrhosis Paternal Grandfather   . Hypertension Paternal Grandmother   . Aneurysm Paternal Grandmother   . Cirrhosis Maternal  Grandmother   . Cirrhosis Maternal Grandfather   . Cancer Father   . Cirrhosis Father   . Cirrhosis Mother   . Breast cancer Sister   . Hypertension Sister   . Bronchitis Daughter   . Bronchitis Daughter   . Asthma Son   . Bronchitis Son   . Migraines Neg Hx     Social History   Socioeconomic History  . Marital status: Single    Spouse name: Not on file  . Number of children: Not on file  . Years of education: Not on file  . Highest education level: Not on file  Occupational History  . Not on file  Tobacco Use  . Smoking status: Former Smoker    Types: Cigarettes    Quit date: 2016    Years since quitting: 6.1  . Smokeless tobacco: Never Used  . Tobacco comment: wears nicotine patches  Vaping Use  . Vaping Use: Never used  Substance and Sexual Activity  . Alcohol use: Not Currently  . Drug use: Not Currently    Types: Cocaine    Comment: crack  last used 2016  . Sexual activity: Yes    Birth control/protection: Surgical    Comment: tubal  Other Topics Concern  . Not on file  Social History Narrative   R handed    Lives with boyfriend   1 Cup of caffeine daily    Social Determinants of Health   Financial Resource Strain: Not on file  Food Insecurity: Not on file  Transportation Needs: Not on file  Physical Activity: Not on file  Stress: Not on file  Social Connections: Not on file  Intimate Partner Violence: Not on file    Outpatient Medications Prior to Visit  Medication Sig Dispense Refill  . albuterol (PROVENTIL HFA;VENTOLIN HFA) 108 (90 Base) MCG/ACT inhaler Inhale 2 puffs into the lungs every 6 (six) hours as needed for wheezing or shortness of breath.    Marland Kitchen albuterol (PROVENTIL) (2.5 MG/3ML) 0.083% nebulizer solution Take 2.5 mg by nebulization every 6 (six) hours as needed for wheezing or shortness of breath.    Marland Kitchen amLODipine (NORVASC) 5 MG tablet Take 5 mg by mouth daily.    . Boric Acid Vaginal 600 MG SUPP Place 600 mg vaginally as directed. Place  one capsule inside the vagina at bedtime every other night for 3 doses. Use once a week at bedtime thereafter for foul odor. 30 suppository 0  . budesonide-formoterol (SYMBICORT) 80-4.5 MCG/ACT inhaler Inhale 2 puffs into the lungs 2 (two) times daily. 1 each 5  . celecoxib (CELEBREX) 200 MG capsule Take 1 capsule (200 mg total) by mouth 2 (two) times daily. For pain 20 capsule 0  . citalopram (CELEXA) 20 MG tablet Take 20 mg by mouth daily.    . cloNIDine (CATAPRES) 0.1 MG tablet TAKE 1 TABLET BY MOUTH ONCE DAILY FOR BLOOD PRESSURE AND ANXIETY. 90 tablet 0  . hydrOXYzine (VISTARIL) 50 MG capsule Take 1 capsule (50 mg total) by mouth daily as needed for anxiety (Insomnia). (Patient taking differently: Take 50 mg by mouth at bedtime.) 30 capsule 5  . methocarbamol (ROBAXIN) 500 MG tablet Take 500 mg by mouth in the morning and at bedtime.    Marland Kitchen omeprazole (PRILOSEC) 20 MG capsule Take 20 mg by mouth daily.    . ondansetron (ZOFRAN ODT) 4 MG disintegrating tablet Take 1 tablet (4 mg total) by mouth every 8 (eight) hours as needed for nausea or vomiting. 20 tablet 0  . ondansetron (ZOFRAN ODT) 8 MG disintegrating tablet Take 1 tablet (8 mg total) by mouth every 8 (eight) hours as needed for nausea or vomiting. 8 tablet 0  . oxyCODONE (ROXICODONE) 5 MG immediate release tablet Take 1 tablet (5 mg total) by mouth every 4 (four) hours as needed for severe pain. 15 tablet 0  . pregabalin (LYRICA) 75 MG capsule Take 1 capsule (75 mg total) by mouth 2 (two) times daily. 60 capsule 5  . tiZANidine (ZANAFLEX) 2 MG tablet Take 2 mg by mouth 2 (two) times daily as needed for muscle spasms.    Marland Kitchen torsemide (DEMADEX) 5 MG tablet Take 5 mg by mouth daily.    . Vitamin D, Ergocalciferol, (DRISDOL) 1.25 MG (50000 UNIT) CAPS capsule Take 50,000 Units by mouth every 7 (seven) days.    Marland Kitchen oxyCODONE (OXY IR/ROXICODONE) 5 MG immediate release tablet Take 1 tablet (5 mg total) by mouth every 4 (four) hours as needed for severe  pain. (Patient not taking: Reported on 06/21/2020) 10 tablet 0   No facility-administered medications prior to visit.    Allergies  Allergen Reactions  . Clonopin [Clonazepam] Anaphylaxis    Swelling of the tongue and mouth.   . Fish Allergy Anaphylaxis, Shortness Of Breath and Swelling  . Flexeril [Cyclobenzaprine Hcl] Shortness Of Breath  . Ibuprofen Anaphylaxis and Hives  . Shellfish Allergy Anaphylaxis  . Tylenol [Acetaminophen] Anaphylaxis  . Ace Inhibitors Cough  . Cyclobenzaprine   . Tramadol Nausea And Vomiting    Upset  stomach  . Trazodone And Nefazodone Hives    Review of Systems  Constitutional: Negative for chills and fever.  HENT: Negative for congestion, sinus pressure, sinus pain and sore throat.   Eyes: Negative for pain and discharge.  Respiratory: Negative for cough and shortness of breath.   Cardiovascular: Positive for chest pain. Negative for palpitations.  Gastrointestinal: Negative for abdominal pain, constipation, diarrhea, nausea and vomiting.  Endocrine: Negative for polydipsia and polyuria.  Genitourinary: Positive for pelvic pain. Negative for dysuria, hematuria, menstrual problem, vaginal bleeding and vaginal discharge.  Musculoskeletal: Positive for arthralgias and back pain. Negative for neck pain and neck stiffness.  Skin: Negative for rash.  Neurological: Negative for dizziness and weakness.  Psychiatric/Behavioral: Positive for dysphoric mood and sleep disturbance. Negative for agitation and behavioral problems.       Objective:    Physical Exam Vitals reviewed.  Constitutional:      General: She is not in acute distress.    Appearance: She is obese. She is not diaphoretic.  HENT:     Head: Normocephalic and atraumatic.     Nose: Nose normal.     Mouth/Throat:     Mouth: Mucous membranes are moist.  Eyes:     General: No scleral icterus.    Extraocular Movements: Extraocular movements intact.     Pupils: Pupils are equal, round, and  reactive to light.  Cardiovascular:     Rate and Rhythm: Normal rate and regular rhythm.     Pulses: Normal pulses.     Heart sounds: Normal heart sounds. No murmur heard.   Pulmonary:     Breath sounds: Normal breath sounds. No wheezing or rales.  Chest:     Chest wall: Tenderness (Left side) present.  Abdominal:     Palpations: Abdomen is soft.     Tenderness: There is no abdominal tenderness. There is no guarding or rebound.  Musculoskeletal:     Cervical back: Neck supple. No tenderness.     Right lower leg: No edema.     Left lower leg: No edema.  Skin:    General: Skin is warm.     Findings: No rash.  Neurological:     General: No focal deficit present.     Mental Status: She is alert and oriented to person, place, and time.     Sensory: No sensory deficit.     Motor: No weakness.  Psychiatric:        Mood and Affect: Mood normal.        Behavior: Behavior normal.     BP 106/71 (BP Location: Right Arm, Patient Position: Sitting, Cuff Size: Normal)   Pulse 80   Temp 98 F (36.7 C) (Oral)   Resp 18   Ht 4\' 11"  (1.499 m)   Wt 193 lb 1.9 oz (87.6 kg)   LMP 06/07/2020   SpO2 97%   BMI 39.01 kg/m  Wt Readings from Last 3 Encounters:  06/21/20 193 lb 1.9 oz (87.6 kg)  06/18/20 189 lb (85.7 kg)  06/07/20 179 lb 14.3 oz (81.6 kg)    Health Maintenance Due  Topic Date Due  . MAMMOGRAM  Never done  . COLONOSCOPY (Pts 45-4yrs Insurance coverage will need to be confirmed)  Never done  . COVID-19 Vaccine (3 - Booster for Moderna series) 04/29/2020    There are no preventive care reminders to display for this patient.   Lab Results  Component Value Date   TSH 1.533 12/24/2018   Lab Results  Component Value Date   WBC 8.6 06/07/2020   HGB 12.2 06/07/2020   HCT 37.9 06/07/2020   MCV 92.2 06/07/2020   PLT 584 (H) 06/07/2020   Lab Results  Component Value Date   NA 139 06/07/2020   K 4.1 06/07/2020   CO2 22 06/07/2020   GLUCOSE 96 06/07/2020   BUN 21  (H) 06/07/2020   CREATININE 0.86 06/07/2020   BILITOT 0.4 06/07/2020   ALKPHOS 66 06/07/2020   AST 16 06/07/2020   ALT 18 06/07/2020   PROT 7.5 06/07/2020   ALBUMIN 3.5 06/07/2020   CALCIUM 9.1 06/07/2020   ANIONGAP 10 06/07/2020   Lab Results  Component Value Date   CHOL 155 04/20/2017   Lab Results  Component Value Date   HDL 70 04/20/2017   Lab Results  Component Value Date   LDLCALC 72 04/20/2017   Lab Results  Component Value Date   TRIG 46 04/20/2017   Lab Results  Component Value Date   CHOLHDL 2.2 04/20/2017   Lab Results  Component Value Date   HGBA1C 5.4 04/20/2017       Assessment & Plan:   Problem List Items Addressed This Visit   Chest pain Likely costochondritis Recent episodes of URTI Advised Tylenol/Naproxen EKG: Sinus rhythm. HR: 77. Nonspecific T wave changes in III and V6, similar to prior. No signs of active ischemia.  Depression On Citalopram Prefers to see a different Psychiatrist, advised patient to continue seeing current Psychiatrist until she finds a new provider.   Visit Diagnoses    Screening mammogram for breast cancer    -  Primary   Relevant Orders   MM DIAG BREAST TOMO BILATERAL       No orders of the defined types were placed in this encounter.    Lindell Spar, MD

## 2020-06-22 ENCOUNTER — Telehealth: Payer: Self-pay | Admitting: Internal Medicine

## 2020-06-22 NOTE — Telephone Encounter (Signed)
   Telephone encounter was:  Successful.  06/22/2020 Name: Kathryn [name] MRN: [id number] DOB: 05/23/1970  Kathryn [name] is a 50 y.o. year old female who is a primary care patient of Lindell Spar, MD . The community resource team was consulted for assistance with Transportation Needs   Care guide performed the following interventions: Discussed resources to assist with transportation. [name] stated that she will have surgery on July 09, 2020 and will possibly need transportion after her surgery. Patient stated she has Medicaid Transportation and she can use that resource for her medical appointments. Informed patient that if  she still needs assistance to give the office a call. Patient stated understanding. .  Follow Up Plan:  No further follow up planned at this time. The patient has been provided with needed resources.  [place], Care Management Phone: [phone] Email: [email or web address]

## 2020-06-23 ENCOUNTER — Other Ambulatory Visit: Payer: Self-pay | Admitting: Neurosurgery

## 2020-07-08 ENCOUNTER — Other Ambulatory Visit: Payer: Self-pay | Admitting: Internal Medicine

## 2020-07-08 DIAGNOSIS — N644 Mastodynia: Secondary | ICD-10-CM

## 2020-07-13 ENCOUNTER — Ambulatory Visit (HOSPITAL_COMMUNITY)
Admission: RE | Admit: 2020-07-13 | Discharge: 2020-07-13 | Disposition: A | Discharge status: Home / Self Care | Payer: Medicaid Other | Source: Ambulatory Visit | Attending: Internal Medicine | Admitting: Internal Medicine

## 2020-07-13 ENCOUNTER — Ambulatory Visit: Payer: Medicaid Other | Admitting: Neurology

## 2020-07-13 ENCOUNTER — Other Ambulatory Visit: Payer: Self-pay

## 2020-07-13 DIAGNOSIS — R928 Other abnormal and inconclusive findings on diagnostic imaging of breast: Secondary | ICD-10-CM | POA: Diagnosis not present

## 2020-07-13 DIAGNOSIS — Z1231 Encounter for screening mammogram for malignant neoplasm of breast: Secondary | ICD-10-CM

## 2020-07-13 DIAGNOSIS — N644 Mastodynia: Secondary | ICD-10-CM | POA: Diagnosis not present

## 2020-07-15 ENCOUNTER — Encounter: Payer: Self-pay | Admitting: Neurology

## 2020-07-22 ENCOUNTER — Telehealth (INDEPENDENT_AMBULATORY_CARE_PROVIDER_SITE_OTHER): Payer: Self-pay | Admitting: *Deleted

## 2020-07-22 NOTE — Telephone Encounter (Signed)
Referring MD/PCP: Kathryn [name]   Procedure: tcs w PROPOFOL 59923  Reason/Indication:  tcs  Has patient had this procedure before?  screening  If so, when, by whom and where?    Is there a family history of colon cancer?  no  Who?  What age when diagnosed?    Is patient diabetic?   Not sure      Does patient have prosthetic heart valve or mechanical valve?  no  Do you have a pacemaker/defibrillator?  no  Has patient ever had endocarditis/atrial fibrillation? no  Have you had a stroke/heart attack last 6 mths? no  Does patient use oxygen? no  Has patient had joint replacement within last 12 months?  yes  Is patient constipated or do they take laxatives? no  Does patient have a history of alcohol/drug use?  Yes, cocaine use - clean 3 years  Is patient on blood thinner such as Coumadin, Plavix and/or Aspirin? no  Do you take medicine for weight loss. no  Medications: albuterol 108 mcg 2 puffs bid, amlodipine 5 mg daily, budesonide 2 pufss bid, celecoxib 100 mg bid, citalopram 20 mg daily, clonidine 0.1 mg daily, hydroxyzine 50 mg prn, macrobid 100 mg bid, lyrica 75 mg bid, zanaflex 2 mg bid, torsemide 5 mg daily  Allergies: ace inhibitors, tramadol, shellfish, ibuprofen, flexeril, clonopin  Medication Adjustment per Dr Rehman/Dr Jenetta Downer   Procedure date & time:

## 2020-07-23 ENCOUNTER — Ambulatory Visit: Payer: Medicaid Other | Admitting: Internal Medicine

## 2020-07-27 ENCOUNTER — Telehealth: Payer: Self-pay

## 2020-07-27 NOTE — Telephone Encounter (Signed)
Pt LM that she needs refills, however Wanted [name] to call her back so she could give [name] the names. Called and LM with Pt to call us

## 2020-07-28 ENCOUNTER — Ambulatory Visit: Payer: Medicaid Other | Admitting: Internal Medicine

## 2020-08-04 ENCOUNTER — Ambulatory Visit: Payer: Medicaid Other | Admitting: Neurology

## 2020-08-05 ENCOUNTER — Encounter: Payer: Self-pay | Admitting: Neurology

## 2020-08-06 ENCOUNTER — Other Ambulatory Visit: Payer: Self-pay | Admitting: Obstetrics & Gynecology

## 2020-08-06 ENCOUNTER — Telehealth: Payer: Self-pay

## 2020-08-06 MED ORDER — HYDROCODONE-ACETAMINOPHEN 5-325 MG PO TABS: 1.0000 | ORAL_TABLET | Freq: Four times a day (QID) | ORAL | 0 refills | Status: DC | PRN

## 2020-08-06 MED ORDER — MYFEMBREE 40-1-0.5 MG PO TABS: 1.0000 | ORAL_TABLET | Freq: Every day | ORAL | 11 refills | Status: DC

## 2020-08-06 NOTE — Telephone Encounter (Signed)
I refilled her hydrocodone and also sent in a script for myfembree for the patient to take, it will probably have to be ordered so it may be a few days before the myfmbree comes in

## 2020-08-06 NOTE — Telephone Encounter (Signed)
Pt called requsting a refill of the hydrocodone from her surgery, she stated that she is still having stomach pain, she stated the pain was just like before her surgery.

## 2020-08-06 NOTE — Telephone Encounter (Signed)
Pt aware Hydrocodone was refilled and Myfembree was sent to pharmacy but it will probably have to be ordered so it may take a few days before Myfembree come in. Pt voiced understanding. [place]

## 2020-08-09 ENCOUNTER — Emergency Department (HOSPITAL_COMMUNITY)
Admission: EM | Admit: 2020-08-09 | Discharge: 2020-08-09 | Disposition: A | Discharge status: Home / Self Care | Payer: Medicaid Other | Attending: Emergency Medicine | Admitting: Emergency Medicine

## 2020-08-09 ENCOUNTER — Other Ambulatory Visit: Payer: Self-pay | Admitting: Internal Medicine

## 2020-08-09 ENCOUNTER — Emergency Department (HOSPITAL_COMMUNITY): Payer: Medicaid Other

## 2020-08-09 ENCOUNTER — Other Ambulatory Visit: Payer: Self-pay

## 2020-08-09 ENCOUNTER — Encounter (HOSPITAL_COMMUNITY): Payer: Self-pay | Admitting: *Deleted

## 2020-08-09 DIAGNOSIS — M25461 Effusion, right knee: Secondary | ICD-10-CM | POA: Insufficient documentation

## 2020-08-09 DIAGNOSIS — J449 Chronic obstructive pulmonary disease, unspecified: Secondary | ICD-10-CM | POA: Diagnosis not present

## 2020-08-09 DIAGNOSIS — J4530 Mild persistent asthma, uncomplicated: Secondary | ICD-10-CM | POA: Insufficient documentation

## 2020-08-09 DIAGNOSIS — M25561 Pain in right knee: Secondary | ICD-10-CM | POA: Diagnosis not present

## 2020-08-09 DIAGNOSIS — Z87891 Personal history of nicotine dependence: Secondary | ICD-10-CM | POA: Insufficient documentation

## 2020-08-09 DIAGNOSIS — I10 Essential (primary) hypertension: Secondary | ICD-10-CM | POA: Diagnosis not present

## 2020-08-09 MED ORDER — CELECOXIB 200 MG PO CAPS
200.0000 mg | ORAL_CAPSULE | Freq: Two times a day (BID) | ORAL | 0 refills | Status: DC
Start: 2020-08-09 — End: 2020-08-16

## 2020-08-09 MED ORDER — OXYCODONE HCL 5 MG PO TABS
5.0000 mg | ORAL_TABLET | Freq: Once | ORAL | Status: AC
Start: 2020-08-09 — End: 2020-08-09
  Administered 2020-08-09: 5 mg via ORAL
  Filled 2020-08-09: qty 1
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
## 2020-08-09 NOTE — ED Triage Notes (Addendum)
Pt c/o right knee swelling and pain since Thursday. Pt reports she has been walking around and moving more than normal due to moving houses but no known injury that she is aware of. Pt has difficulty with ambulation.

## 2020-08-09 NOTE — Discharge Instructions (Signed)
You were seen in the emerge department today with knee pain and swelling.  Your x-ray shows a small amount of fluid and arthritis along with possible ligament strain.  Continue to use the Ace wrap and apply ice.  I have refilled your Celebrex prescription.  Please follow with your primary care doctor.  I also listed the name of the orthopedic surgeon who worked on your left knee if symptoms continue you may need to see them again.

## 2020-08-10 ENCOUNTER — Telehealth: Payer: Self-pay

## 2020-08-10 ENCOUNTER — Other Ambulatory Visit: Payer: Self-pay | Admitting: *Deleted

## 2020-08-10 MED ORDER — TIZANIDINE HCL 2 MG PO TABS: 2.0000 mg | ORAL_TABLET | Freq: Two times a day (BID) | ORAL | 0 refills | Status: DC | PRN

## 2020-08-10 NOTE — Telephone Encounter (Signed)
Pt medication sent to pharmacy  

## 2020-08-10 NOTE — Telephone Encounter (Signed)
Transition Care Management Unsuccessful Follow-up Telephone Call  Date of discharge and from where:  08/09/2020 from [facility]  Attempts:  1st Attempt  Reason for unsuccessful TCM follow-up call:  Left voice message

## 2020-08-10 NOTE — Telephone Encounter (Signed)
Okay to refill? 

## 2020-08-10 NOTE — Telephone Encounter (Signed)
Patient needing refill on tizanidine sent to [facility] ph# [phone]

## 2020-08-11 NOTE — Telephone Encounter (Signed)
Transition Care Management Follow-up Telephone Call  Date of discharge and from where: 08/09/2020 from [facility]  How have you been since you were released from the hospital? Pt stated that she is feeling a lot better today. Pt went to ortho yesterday and had fluid draw off of her knee. Pt stated that she is scheduling her knee replacement in the next few months.   Any questions or concerns? No  Items Reviewed:  Did the pt receive and understand the discharge instructions provided? Yes   Medications obtained and verified? Yes   Other? No   Any new allergies since your discharge? No   Dietary orders reviewed? n/a  Do you have support at home? Yes   Functional Questionnaire: (I = Independent and D = Dependent) ADLs: I  Bathing/Dressing- I  Meal Prep- I  Eating- I  Maintaining continence- I  Transferring/Ambulation- D due to pain and uses a crutch  Managing Meds- I   Follow up appointments reviewed:   PCP Hospital f/u appt confirmed? No    Specialist Hospital f/u appt confirmed? No    Are transportation arrangements needed? No   If their condition worsens, is the pt aware to call PCP or go to the Emergency Dept.? Yes  Was the patient provided with contact information for the PCP's office or ED? Yes  Was to pt encouraged to call back with questions or concerns? Yes .

## 2020-08-16 ENCOUNTER — Telehealth: Payer: Self-pay

## 2020-08-16 ENCOUNTER — Other Ambulatory Visit: Payer: Self-pay | Admitting: *Deleted

## 2020-08-16 DIAGNOSIS — M545 Low back pain, unspecified: Secondary | ICD-10-CM

## 2020-08-16 MED ORDER — PREGABALIN 75 MG PO CAPS: 75.0000 mg | ORAL_CAPSULE | Freq: Two times a day (BID) | ORAL | 5 refills | Status: DC

## 2020-08-16 MED ORDER — CELECOXIB 200 MG PO CAPS: 200.0000 mg | ORAL_CAPSULE | Freq: Two times a day (BID) | ORAL | 0 refills | Status: DC

## 2020-08-16 MED ORDER — TORSEMIDE 5 MG PO TABS: 5.0000 mg | ORAL_TABLET | Freq: Every day | ORAL | 0 refills | Status: DC

## 2020-08-16 MED ORDER — OMEPRAZOLE 20 MG PO CPDR: 20.0000 mg | DELAYED_RELEASE_CAPSULE | Freq: Every day | ORAL | 0 refills | Status: DC

## 2020-08-16 NOTE — Telephone Encounter (Signed)
Patient called need med refills, patient said some of these meds came from Dr Everette Rank in the past  celecoxib (CELEBREX) 200 MG capsule omeprazole (PRILOSEC) 20 MG capsule  torsemide (DEMADEX) 5 MG tablet pregabalin (LYRICA) 75 MG capsule   [facility]

## 2020-08-16 NOTE — Telephone Encounter (Signed)
Pt medication sent to pharmacy  

## 2020-08-17 ENCOUNTER — Inpatient Hospital Stay (HOSPITAL_COMMUNITY): Admission: RE | Admit: 2020-08-17 | Payer: Medicaid Other | Source: Ambulatory Visit

## 2020-08-17 NOTE — Pre-Procedure Instructions (Signed)
Surgical Instructions    Your procedure is scheduled on Friday, May 6th.  Report to [facility] Main Entrance "A" at 5:30 A.M., then check in with the Admitting office.  Call this number if you have problems the morning of surgery:  [phone]   If you have any questions prior to your surgery date call [phone]: Open Monday-Friday 8am-4pm    Remember:  Do not eat or drink after midnight the night before your surgery    Take these medicines the morning of surgery with A SIP OF WATER  amLODipine (NORVASC)  citalopram (CELEXA) methocarbamol (ROBAXIN)  omeprazole (PRILOSEC) pregabalin (LYRICA)  budesonide-formoterol (SYMBICORT)   Take these medications AS NEEDED: Albuterol Inhaler-please bring inhaler with you to surgery Albuterol Nebulizer cloNIDine (CATAPRES)  HYDROcodone-acetaminophen (NORCO/VICODIN) or oxyCODONE (ROXICODONE) for mild to severe pain. ondansetron (ZOFRAN ODT) tiZANidine (ZANAFLEX)   As of today, STOP taking any Aspirin (unless otherwise instructed by your surgeon) Aleve, Naproxen, Ibuprofen, Motrin, Advil, Goody's, BC's, all herbal medications, fish oil, and all vitamins. This includes: celecoxib (CELEBREX)                     Do NOT Smoke (Tobacco/Vaping) or drink Alcohol 24 hours prior to your procedure.  If you use a CPAP at night, you may bring all equipment for your overnight stay.   Contacts, glasses, piercing's, hearing aid's, dentures or partials may not be worn into surgery, please bring cases for these belongings.    For patients admitted to the hospital, discharge time will be determined by your treatment team.   Patients discharged the day of surgery will not be allowed to drive home, and someone needs to stay with them for 24 hours.    Special instructions:   [place]- Preparing For Surgery  Before surgery, you can play an important role. Because skin is not sterile, your skin needs to be as free of germs as possible. You can reduce  the number of germs on your skin by washing with CHG (chlorahexidine gluconate) Soap before surgery.  CHG is an antiseptic cleaner which kills germs and bonds with the skin to continue killing germs even after washing.    Oral Hygiene is also important to reduce your risk of infection.  Remember - BRUSH YOUR TEETH THE MORNING OF SURGERY WITH YOUR REGULAR TOOTHPASTE  Please do not use if you have an allergy to CHG or antibacterial soaps. If your skin becomes reddened/irritated stop using the CHG.  Do not shave (including legs and underarms) for at least 48 hours prior to first CHG shower. It is OK to shave your face.  Please follow these instructions carefully.   1. Shower the NIGHT BEFORE SURGERY and the MORNING OF SURGERY  2. If you chose to wash your hair, wash your hair first as usual with your normal shampoo.  3. After you shampoo, rinse your hair and body thoroughly to remove the shampoo.  4. Use CHG Soap as you would any other liquid soap. You can apply CHG directly to the skin and wash gently with a scrungie or a clean washcloth.   5. Apply the CHG Soap to your body ONLY FROM THE NECK DOWN.  Do not use on open wounds or open sores. Avoid contact with your eyes, ears, mouth and genitals (private parts). Wash Face and genitals (private parts)  with your normal soap.   6. Wash thoroughly, paying special attention to the area where your surgery will be performed.  7. Thoroughly rinse  your body with warm water from the neck down.  8. DO NOT shower/wash with your normal soap after using and rinsing off the CHG Soap.  9. Pat yourself dry with a CLEAN TOWEL.  10. Wear CLEAN PAJAMAS to bed the night before surgery  11. Place CLEAN SHEETS on your bed the night before your surgery  12. DO NOT SLEEP WITH PETS.   Day of Surgery: Shower with CHG soap. Do not wear jewelry, make up, or nail polish Do not wear lotions, powders, perfumes, or deodorant. Do not shave 48 hours prior to  surgery.   Do not bring valuables to the hospital. [facility] is not responsible for any belongings or valuables. Wear Clean/Comfortable clothing the morning of surgery Remember to brush your teeth WITH YOUR REGULAR TOOTHPASTE.   Please read over the following fact sheets that you were given.

## 2020-08-18 ENCOUNTER — Encounter (HOSPITAL_COMMUNITY): Payer: Self-pay

## 2020-08-18 ENCOUNTER — Encounter (HOSPITAL_COMMUNITY)
Admission: RE | Admit: 2020-08-18 | Discharge: 2020-08-18 | Disposition: A | Discharge status: Home / Self Care | Payer: Medicaid Other | Source: Ambulatory Visit | Attending: Neurosurgery | Admitting: Neurosurgery

## 2020-08-18 ENCOUNTER — Other Ambulatory Visit: Payer: Self-pay

## 2020-08-18 DIAGNOSIS — Z01812 Encounter for preprocedural laboratory examination: Secondary | ICD-10-CM | POA: Insufficient documentation

## 2020-08-18 DIAGNOSIS — Z20822 Contact with and (suspected) exposure to covid-19: Secondary | ICD-10-CM | POA: Diagnosis not present

## 2020-08-18 HISTORY — DX: Bipolar disorder, unspecified: F31.9

## 2020-08-18 LAB — BASIC METABOLIC PANEL
Anion gap: 6 (ref 5–15)
BUN: 13 mg/dL (ref 6–20)
CO2: 25 mmol/L (ref 22–32)
Calcium: 8.8 mg/dL — ABNORMAL LOW (ref 8.9–10.3)
Chloride: 101 mmol/L (ref 98–111)
Creatinine, Ser: 0.73 mg/dL (ref 0.44–1.00)
GFR, Estimated: >60 mL/min (ref >60)
Glucose, Bld: 98 mg/dL (ref 70–99)
Potassium: 4.2 mmol/L (ref 3.5–5.1)
Sodium: 132 mmol/L — ABNORMAL LOW (ref 135–145)

## 2020-08-18 LAB — TYPE AND SCREEN
ABO/RH(D): B POS
Antibody Screen: NEGATIVE

## 2020-08-18 LAB — SURGICAL PCR SCREEN
MRSA, PCR: NEGATIVE
Staphylococcus aureus: NEGATIVE

## 2020-08-18 LAB — CBC
HCT: 38.9 % (ref 36.0–46.0)
Hemoglobin: 12.6 g/dL (ref 12.0–15.0)
MCH: 29 pg (ref 26.0–34.0)
MCHC: 32.4 g/dL (ref 30.0–36.0)
MCV: 89.4 fL (ref 80.0–100.0)
Platelets: 643 10*3/uL — ABNORMAL HIGH (ref 150–400)
RBC: 4.35 MIL/uL (ref 3.87–5.11)
RDW: 15 % (ref 11.5–15.5)
WBC: 12.6 10*3/uL — ABNORMAL HIGH (ref 4.0–10.5)
nRBC: 0 % (ref 0.0–0.2)

## 2020-08-18 LAB — SARS CORONAVIRUS 2 (TAT 6-24 HRS): SARS Coronavirus 2: NEGATIVE

## 2020-08-18 NOTE — Progress Notes (Signed)
PCP - Gwynneth Albright Cardiologist - denies  Chest x-ray - n/a EKG - 06-21-20   COVID TEST- 08-18-20   Anesthesia review: n/a  Patient denies shortness of breath, fever, cough and chest pain at PAT appointment   All instructions explained to the patient, with a verbal understanding of the material. Patient agrees to go over the instructions while at home for a better understanding. Patient also instructed to self quarantine after being tested for COVID-19. The opportunity to ask questions was provided.

## 2020-08-19 NOTE — H&P (Signed)
Chief Complaint   No chief complaint on file.   HPI   HPI: Kathryn [name] is a 50 y.o. female with long standing history of chronic low back and bilateral leg pain that has been progressively worsening of her symptoms over the last several months.  She describes pain across her lower back and the top of her buttocks bilaterally, with radiation through both legs and into the calves.  She notes significant exacerbation of pain when she is standing and walking.  At this point, she does continue to complain of pain even when sitting or lying down.  She has not noted any changes in bowel or bladder function.An MRI of her lumbar spine was ordered and revealed moderately severe to severe stenosis at L3-4 and L4-5.  She has failed reasonable conservative treatment as guided by her pain management doctor including p.o. medications, epidural steroid injections and physical therapy.  She presents today for lumbar decompression and fusion.  She is without any concerns.  Patient Active Problem List   Diagnosis Date Noted  . Insomnia 03/19/2020  . Mild persistent asthma without complication [id number]  . Need for immunization against influenza 03/19/2020  . Obesity (BMI 30-39.9) 03/19/2020  . GERD (gastroesophageal reflux disease) 03/19/2020  . Intermittent palpitations 12/17/2019  . Snoring 12/17/2019  . Excessive daytime sleepiness 12/17/2019  . Choking 12/17/2019  . 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 Parasomnia 12/17/2019  . Enlarged uterus 02/20/2018  . Screening for colorectal cancer 02/20/2018  . Routine cervical smear 02/20/2018  . Encounter to establish care 02/20/2018  . Osteoarthritis of left knee, 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  . MDD (major depressive disorder), recurrent episode, severe ([place]) 12/24/2017  . Lumbar back pain 05/11/2017  . Low vitamin B12 level 05/11/2017  . Primary hypertension 01/11/2017    PMH: Past Medical History:  Diagnosis Date  . Anxiety    . Arthritis   . Asthma   . Bipolar disorder ([place])   . Chronic back pain   . COPD (chronic obstructive pulmonary disease) (H[facility])    Chronic bronchitis  . Depression   . GERD (gastroesophageal reflux disease)   . Hypertension   . Neuropathy   . Osteoarthritis of left knee, 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  . Sleep apnea 04/2020   GETTING A cpap  . Suicidal ideation 12/24/2018    PSH: Past Surgical History:  Procedure Laterality Date  . DILATATION AND CURETTAGE/HYSTEROSCOPY WITH MINERVA N/A 06/09/2020   Procedure: DILATATION AND CURETTAGE/HYSTEROSCOPY WITH MINERVA;  Surgeon: Florian Buff, MD;  Location: [facility] ORS;  Service: Gynecology;  Laterality: N/A;  . ESOPHAGOGASTRODUODENOSCOPY (EGD) WITH PROPOFOL N/A 12/25/2018   Procedure: ESOPHAGOGASTRODUODENOSCOPY (EGD) WITH PROPOFOL;  Surgeon: Rogene Houston, MD;  Location: [facility] END[facility] SUITE;  Service: Endoscopy;  Laterality: N/A;  . PATELLA-FEMORAL ARTHROPLASTY Left 10/08/2018   Procedure: PATELLA-FEMORAL ARTHROPLASTY;  Surgeon: Marchia Bond, MD;  Location: [facility]S;  Service: Orthopedics;  Laterality: Left;  . TUBAL LIGATION      No medications prior to admission.    SH: Social History   Tobacco Use  . Smoking status: Former Smoker    Types: Cigarettes    Quit date: 2016    Years since quitting: 6.3  . Smokeless tobacco: Never Used  . Tobacco comment: wears nicotine patches  Vaping Use  . Vaping Use: Never used  Substance Use Topics  . Alcohol use: Not Currently  . Drug use: Not Currently    Types: Cocaine    Comment: crack  last used 2016  MEDS: Prior to Admission medications   Medication Sig Start Date End Date Taking? Authorizing Provider  albuterol (PROVENTIL HFA;VENTOLIN HFA) 108 (90 Base) MCG/ACT inhaler Inhale 2 puffs into the lungs every 6 (six) hours as needed for wheezing or shortness of breath.    [provider]  albuterol (PROVENTIL) (2.5 MG/3ML) 0.083% nebulizer solution Take 2.5 mg by nebulization every 6 (six)  hours as needed for wheezing or shortness of breath.    [provider]  amLODipine (NORVASC) 5 MG tablet Take 5 mg by mouth daily.    [provider]  Boric Acid Vaginal 600 MG SUPP Place 600 mg vaginally as directed. Place one capsule inside the vagina at bedtime every other night for 3 doses. Use once a week at bedtime thereafter for foul odor. 05/28/20   Margarita Mail, PA-C  budesonide-formoterol (SYMBICORT) 80-4.5 MCG/ACT inhaler Inhale 2 puffs into the lungs 2 (two) times daily. 03/19/20   Lindell Spar, MD  celecoxib (CELEBREX) 200 MG capsule Take 1 capsule (200 mg total) by mouth 2 (two) times daily. For pain 08/16/20   Lindell Spar, MD  citalopram (CELEXA) 20 MG tablet Take 20 mg by mouth daily.    [provider]  cloNIDine (CATAPRES) 0.1 MG tablet TAKE 1 TABLET BY MOUTH ONCE DAILY FOR BLOOD PRESSURE AND ANXIETY. 06/02/20   Lindell Spar, MD  HYDROcodone-acetaminophen (NORCO/VICODIN) 5-325 MG tablet Take 1 tablet by mouth every 6 (six) hours as needed. 08/06/20   Florian Buff, MD  hydrOXYzine (VISTARIL) 50 MG capsule Take 1 capsule (50 mg total) by mouth daily as needed for anxiety (Insomnia). Patient taking differently: Take 50 mg by mouth at bedtime. 03/19/20   Lindell Spar, MD  methocarbamol (ROBAXIN) 500 MG tablet Take 500 mg by mouth in the morning and at bedtime.    [provider]  omeprazole (PRILOSEC) 20 MG capsule Take 1 capsule (20 mg total) by mouth daily. 08/16/20   Lindell Spar, MD  ondansetron (ZOFRAN ODT) 4 MG disintegrating tablet Take 1 tablet (4 mg total) by mouth every 8 (eight) hours as needed for nausea or vomiting. Patient not taking: No sig reported 06/01/20   Truddie Hidden, MD  ondansetron (ZOFRAN ODT) 8 MG disintegrating tablet Take 1 tablet (8 mg total) by mouth every 8 (eight) hours as needed for nausea or vomiting. 06/09/20   Florian Buff, MD  oxyCODONE (ROXICODONE) 5 MG immediate release tablet Take 1 tablet (5 mg  total) by mouth every 4 (four) hours as needed for severe pain. 06/15/20   Florian Buff, MD  pregabalin (LYRICA) 75 MG capsule Take 1 capsule (75 mg total) by mouth 2 (two) times daily. 08/16/20   Lindell Spar, MD  Relugolix-Estradiol-Norethind (MYFEMBREE) 40-1-0.5 MG TABS Take 1 tablet by mouth daily. 08/06/20   Florian Buff, MD  tiZANidine (ZANAFLEX) 2 MG tablet Take 1 tablet (2 mg total) by mouth 2 (two) times daily as needed for muscle spasms. 08/10/20   Lindell Spar, MD  torsemide (DEMADEX) 5 MG tablet Take 1 tablet (5 mg total) by mouth daily. 08/16/20   Lindell Spar, MD  Vitamin D, Ergocalciferol, (DRISDOL) 1.25 MG (50000 UNIT) CAPS capsule Take 50,000 Units by mouth every 7 (seven) days.    [provider]    ALLERGY: Allergies  Allergen Reactions  . Clonopin [Clonazepam] Anaphylaxis    Swelling of the tongue and mouth.   . Fish Allergy Anaphylaxis, Shortness Of Breath  and Swelling  . Flexeril [Cyclobenzaprine Hcl] Shortness Of Breath  . Ibuprofen Anaphylaxis and Hives  . Shellfish Allergy Anaphylaxis  . Tylenol [Acetaminophen] Anaphylaxis  . Ace Inhibitors Cough  . Cyclobenzaprine   . Tramadol Nausea And Vomiting    Upset stomach  . Trazodone And Nefazodone Hives    Social History   Tobacco Use  . Smoking status: Former Smoker    Types: Cigarettes    Quit date: 2016    Years since quitting: 6.3  . Smokeless tobacco: Never Used  . Tobacco comment: wears nicotine patches  Substance Use Topics  . Alcohol use: Not Currently     Family History  Problem Relation Age of Onset  . Gout Paternal Grandfather   . Cirrhosis Paternal Grandfather   . Hypertension Paternal Grandmother   . Aneurysm Paternal Grandmother   . Cirrhosis Maternal Grandmother   . Cirrhosis Maternal Grandfather   . Cancer Father   . Cirrhosis Father   . Cirrhosis Mother   . Breast cancer Sister   . Hypertension Sister   . Bronchitis Daughter   . Bronchitis Daughter   . Asthma Son    . Bronchitis Son   . Migraines Neg Hx      ROS   ROS  Exam   There were no vitals filed for this visit. General appearance: WDWN, NAD Eyes: No scleral injection Cardiovascular: Regular rate and rhythm without murmurs, rubs, gallops. No edema or variciosities. Distal pulses normal. Pulmonary: Effort normal, non-labored breathing Musculoskeletal:     Muscle tone upper extremities: Normal    Muscle tone lower extremities: Normal    Motor exam: Upper Extremities Deltoid Bicep Tricep Grip  Right 5/5 5/5 5/5 5/5  Left 5/5 5/5 5/5 5/5   Lower Extremity IP Quad PF DF EHL  Right 5/5 5/5 5/5 5/5 5/5  Left 5/5 5/5 5/5 5/5 5/5   Neurological Mental Status:    - Patient is awake, alert, oriented to person, place, month, year, and situation    - Patient is able to give a clear and coherent history.    - No signs of aphasia or neglect Cranial Nerves    - II: Visual Fields are full. PERRL    - III/IV/VI: EOMI without ptosis or diploplia.     - V: Facial sensation is grossly normal    - VII: Facial movement is symmetric.     - VIII: hearing is intact to voice    - X: Uvula elevates symmetrically    - XI: Shoulder shrug is symmetric.    - XII: tongue is midline without atrophy or fasciculations.  Sensory: Sensation grossly intact to LT  Results - Imaging/Labs   Results for orders placed or performed during the hospital encounter of 08/18/20 (from the past 48 hour(s))  Basic metabolic panel per protocol     Status: Abnormal   Collection Time: 08/18/20 10:47 AM  Result Value Ref Range   Sodium 132 (L) 135 - 145 mmol/L   Potassium 4.2 3.5 - 5.1 mmol/L   Chloride 101 98 - 111 mmol/L   CO2 25 22 - 32 mmol/L   Glucose, Bld 98 70 - 99 mg/dL    Comment: Glucose reference range applies only to samples taken after fasting for at least 8 hours.   BUN 13 6 - 20 mg/dL   Creatinine, Ser 0.73 0.44 - 1.00 mg/dL   Calcium 8.8 (L) 8.9 - 10.3 mg/dL   GFR, Estimated >60 >60 mL/min  Comment:  (NOTE) Calculated using the CKD-EPI Creatinine Equation (2021)    Anion gap 6 5 - 15    Comment: Performed at [facility], [place] [address], [place], [place] [address]  CBC per protocol     Status: Abnormal   Collection Time: 08/18/20 10:47 AM  Result Value Ref Range   WBC 12.6 (H) 4.0 - 10.5 K/uL   RBC 4.35 3.87 - 5.11 MIL/uL   Hemoglobin 12.6 12.0 - 15.0 g/dL   HCT 38.9 36.0 - 46.0 %   MCV 89.4 80.0 - 100.0 fL   MCH 29.0 26.0 - 34.0 pg   MCHC 32.4 30.0 - 36.0 g/dL   RDW 15.0 11.5 - 15.5 %   Platelets 643 (H) 150 - 400 K/uL   nRBC 0.0 0.0 - 0.2 %    Comment: Performed at [facility], [place] [address], [place], [place] [address]  Surgical pcr screen     Status: None   Collection Time: 08/18/20 10:47 AM   Specimen: Nasal Mucosa; Nasal Swab  Result Value Ref Range   MRSA, PCR NEGATIVE NEGATIVE   Staphylococcus aureus NEGATIVE NEGATIVE    Comment: (NOTE) The Xpert SA Assay (FDA approved for NASAL specimens in patients 49 years of age and older), is one component of a comprehensive surveillance program. It is not intended to diagnose infection nor to guide or monitor treatment. Performed at [facility], [place] [address], [place], [place] [address]   SARS CORONAVIRUS 2 (TAT 6-24 HRS) Nasopharyngeal Nasopharyngeal Swab     Status: None   Collection Time: 08/18/20 10:47 AM   Specimen: Nasopharyngeal Swab  Result Value Ref Range   SARS Coronavirus 2 NEGATIVE NEGATIVE    Comment: (NOTE) SARS-CoV-2 target nucleic acids are NOT DETECTED.  The SARS-CoV-2 RNA is generally detectable in upper and lower respiratory specimens during the acute phase of infection. Negative results do not preclude SARS-CoV-2 infection, do not rule out co-infections with other pathogens, and should not be used as the sole basis for treatment or other patient management decisions. Negative results must be combined with clinical observations, patient history, and epidemiological information.  The expected result is Negative.  Fact Sheet for Patients: SugarRoll.be  Fact Sheet for Healthcare Providers: https://www.woods-mathews.com/  This test is not yet approved or cleared by the [place] FDA and  has been authorized for detection and/or diagnosis of SARS-CoV-2 by FDA under an Emergency Use Authorization (EUA). This EUA will remain  in effect (meaning this test can be used) for the duration of the COVID-19 declaration under Se ction 564(b)(1) of the Act, 21 U.S.C. section 360bbb-3(b)(1), unless the authorization is terminated or revoked sooner.  Performed at [facility], [place] [address], [place], [place] [address]   Type and screen     Status: None   Collection Time: 08/18/20 11:02 AM  Result Value Ref Range   ABO/RH(D) B POS    Antibody Screen NEG    Sample Expiration 09/01/2020,2359    Extend sample reason      NO TRANSFUSIONS OR PREGNANCY IN THE PAST 3 MONTHS Performed at [facility], [place] [address] [place], [place] [address]     No results found.  IMAGING: MRI of the lumbar spine dated 02/24/2020 was personally reviewed.  This demonstrates maintenance of lumbar lordosis.  There is minimal disc degenerative disease or loss of height however there is moderately severe stenosis at L3-4 and severe stenosis at L4-5 related to  ligamentous hypertrophy and facet arthropathy.  In addition there is trace grade 1 anterolisthesis at L4-5.  Dynamic lumbar spine x-rays done in the office today were also personally reviewed.  These demonstrate increase in anterolisthesis at L4-5 in the upper right and flexed/extended positions in comparison to the prone MRI.   Impression/Plan   50 y.o. 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  She has had progression of her symptoms despite multiple different conservative treatments.  We will  proceed with decompression and instrumented fusion at L3-4 and L4-5 including diskectomy with interbody cages, interbody arthrodesis, posterior segmental instrumentation from L3-L5, and use of bone allograft/autograft.  We have reviewed the indications for surgery, the associated risks, benefits and alternatives at length in the office.  All questions today were answered and consent was obtained.  Consuella Lose, MD [facility]

## 2020-08-19 NOTE — Anesthesia Preprocedure Evaluation (Addendum)
Anesthesia Evaluation  Patient identified by MRN, date of birth, ID band Patient awake    Reviewed: Allergy & Precautions, NPO status , Patient's Chart, lab work & pertinent test results  Airway Mallampati: I  TM Distance: >3 FB Neck ROM: Full    Dental no notable dental hx. (+) Teeth Intact, Dental Advisory Given   Pulmonary asthma , sleep apnea and Continuous Positive Airway Pressure Ventilation , former smoker,    Pulmonary exam normal breath sounds clear to auscultation       Cardiovascular hypertension, Pt. on medications Normal cardiovascular exam Rhythm:Regular Rate:Normal  ECG: NSR, rate 77    Neuro/Psych PSYCHIATRIC DISORDERS Anxiety Depression Bipolar Disorder negative neurological ROS     GI/Hepatic GERD  Medicated and Controlled,(+)     substance abuse  ,   Endo/Other  negative endocrine ROS  Renal/GU negative Renal ROS     Musculoskeletal  (+) Arthritis , narcotic dependent  Abdominal (+) + obese,   Peds  Hematology negative hematology ROS (+)   Anesthesia Other Findings SPINAL STENOSIS, LUMBAR REGION WITH NEUROGENIC CLAUDICATION  Reproductive/Obstetrics                          Anesthesia Physical Anesthesia Plan  ASA: III  Anesthesia Plan: General   Post-op Pain Management:    Induction: Intravenous  PONV Risk Score and Plan: 3 and Ondansetron, Dexamethasone, Midazolam and Treatment may vary due to age or medical condition  Airway Management Planned: Oral ETT  Additional Equipment:   Intra-op Plan:   Post-operative Plan: Extubation in OR  Informed Consent: I have reviewed the patients History and Physical, chart, labs and discussed the procedure including the risks, benefits and alternatives for the proposed anesthesia with the patient or authorized representative who has indicated his/her understanding and acceptance.     Dental advisory given  Plan  Discussed with: CRNA  Anesthesia Plan Comments:         Anesthesia Quick Evaluation

## 2020-08-20 ENCOUNTER — Inpatient Hospital Stay (HOSPITAL_COMMUNITY): Payer: Medicaid Other | Admitting: Anesthesiology

## 2020-08-20 ENCOUNTER — Encounter (HOSPITAL_COMMUNITY)
Admission: RE | Disposition: A | Discharge status: Skilled Nursing Facility | Payer: Self-pay | Source: Home / Self Care | Attending: Neurosurgery

## 2020-08-20 ENCOUNTER — Encounter (HOSPITAL_COMMUNITY): Payer: Self-pay | Admitting: Neurosurgery

## 2020-08-20 ENCOUNTER — Inpatient Hospital Stay (HOSPITAL_COMMUNITY): Payer: Medicaid Other

## 2020-08-20 ENCOUNTER — Inpatient Hospital Stay (HOSPITAL_COMMUNITY)
Admission: RE | Admit: 2020-08-20 | Discharge: 2020-09-02 | DRG: 460 | Disposition: A | Discharge status: Skilled Nursing Facility | Payer: Medicaid Other | Attending: Neurosurgery | Admitting: Neurosurgery

## 2020-08-20 ENCOUNTER — Other Ambulatory Visit: Payer: Self-pay

## 2020-08-20 DIAGNOSIS — Z886 Allergy status to analgesic agent status: Secondary | ICD-10-CM

## 2020-08-20 DIAGNOSIS — M79604 Pain in right leg: Secondary | ICD-10-CM | POA: Diagnosis not present

## 2020-08-20 DIAGNOSIS — Z7951 Long term (current) use of inhaled steroids: Secondary | ICD-10-CM

## 2020-08-20 DIAGNOSIS — Z888 Allergy status to other drugs, medicaments and biological substances status: Secondary | ICD-10-CM | POA: Diagnosis not present

## 2020-08-20 DIAGNOSIS — M7989 Other specified soft tissue disorders: Secondary | ICD-10-CM | POA: Diagnosis not present

## 2020-08-20 DIAGNOSIS — Z9109 Other allergy status, other than to drugs and biological substances: Secondary | ICD-10-CM | POA: Diagnosis not present

## 2020-08-20 DIAGNOSIS — M47816 Spondylosis without myelopathy or radiculopathy, lumbar region: Secondary | ICD-10-CM | POA: Diagnosis present

## 2020-08-20 DIAGNOSIS — G629 Polyneuropathy, unspecified: Secondary | ICD-10-CM | POA: Diagnosis not present

## 2020-08-20 DIAGNOSIS — R339 Retention of urine, unspecified: Secondary | ICD-10-CM | POA: Diagnosis not present

## 2020-08-20 DIAGNOSIS — Z79899 Other long term (current) drug therapy: Secondary | ICD-10-CM | POA: Diagnosis not present

## 2020-08-20 DIAGNOSIS — Z20822 Contact with and (suspected) exposure to covid-19: Secondary | ICD-10-CM | POA: Diagnosis present

## 2020-08-20 DIAGNOSIS — K219 Gastro-esophageal reflux disease without esophagitis: Secondary | ICD-10-CM | POA: Diagnosis present

## 2020-08-20 DIAGNOSIS — J449 Chronic obstructive pulmonary disease, unspecified: Secondary | ICD-10-CM | POA: Diagnosis present

## 2020-08-20 DIAGNOSIS — M79605 Pain in left leg: Secondary | ICD-10-CM | POA: Diagnosis not present

## 2020-08-20 DIAGNOSIS — M48062 Spinal stenosis, lumbar region with neurogenic claudication: Principal | ICD-10-CM | POA: Diagnosis present

## 2020-08-20 DIAGNOSIS — Z87891 Personal history of nicotine dependence: Secondary | ICD-10-CM | POA: Diagnosis not present

## 2020-08-20 DIAGNOSIS — G8918 Other acute postprocedural pain: Secondary | ICD-10-CM | POA: Diagnosis not present

## 2020-08-20 DIAGNOSIS — Z7901 Long term (current) use of anticoagulants: Secondary | ICD-10-CM | POA: Diagnosis not present

## 2020-08-20 DIAGNOSIS — Z803 Family history of malignant neoplasm of breast: Secondary | ICD-10-CM | POA: Diagnosis not present

## 2020-08-20 DIAGNOSIS — I82441 Acute embolism and thrombosis of right tibial vein: Secondary | ICD-10-CM | POA: Diagnosis not present

## 2020-08-20 DIAGNOSIS — I10 Essential (primary) hypertension: Secondary | ICD-10-CM | POA: Diagnosis present

## 2020-08-20 DIAGNOSIS — F319 Bipolar disorder, unspecified: Secondary | ICD-10-CM | POA: Diagnosis present

## 2020-08-20 DIAGNOSIS — M4326 Fusion of spine, lumbar region: Secondary | ICD-10-CM | POA: Diagnosis not present

## 2020-08-20 DIAGNOSIS — M4316 Spondylolisthesis, lumbar region: Secondary | ICD-10-CM | POA: Diagnosis not present

## 2020-08-20 DIAGNOSIS — Z8249 Family history of ischemic heart disease and other diseases of the circulatory system: Secondary | ICD-10-CM | POA: Diagnosis not present

## 2020-08-20 DIAGNOSIS — N3000 Acute cystitis without hematuria: Secondary | ICD-10-CM | POA: Diagnosis not present

## 2020-08-20 DIAGNOSIS — Z419 Encounter for procedure for purposes other than remedying health state, unspecified: Secondary | ICD-10-CM

## 2020-08-20 DIAGNOSIS — I82451 Acute embolism and thrombosis of right peroneal vein: Secondary | ICD-10-CM | POA: Diagnosis not present

## 2020-08-20 DIAGNOSIS — I82462 Acute embolism and thrombosis of left calf muscular vein: Secondary | ICD-10-CM | POA: Diagnosis not present

## 2020-08-20 DIAGNOSIS — G4700 Insomnia, unspecified: Secondary | ICD-10-CM | POA: Diagnosis not present

## 2020-08-20 DIAGNOSIS — Z825 Family history of asthma and other chronic lower respiratory diseases: Secondary | ICD-10-CM | POA: Diagnosis not present

## 2020-08-20 DIAGNOSIS — J45909 Unspecified asthma, uncomplicated: Secondary | ICD-10-CM | POA: Diagnosis not present

## 2020-08-20 DIAGNOSIS — J4530 Mild persistent asthma, uncomplicated: Secondary | ICD-10-CM | POA: Diagnosis present

## 2020-08-20 DIAGNOSIS — M48061 Spinal stenosis, lumbar region without neurogenic claudication: Secondary | ICD-10-CM | POA: Diagnosis present

## 2020-08-20 DIAGNOSIS — Z981 Arthrodesis status: Secondary | ICD-10-CM | POA: Diagnosis not present

## 2020-08-20 DIAGNOSIS — K5900 Constipation, unspecified: Secondary | ICD-10-CM | POA: Diagnosis not present

## 2020-08-20 DIAGNOSIS — M549 Dorsalgia, unspecified: Secondary | ICD-10-CM | POA: Diagnosis not present

## 2020-08-20 LAB — ABO/RH: ABO/RH(D): B POS

## 2020-08-20 SURGERY — POSTERIOR LUMBAR FUSION 2 LEVEL
Anesthesia: General

## 2020-08-20 MED ORDER — THROMBIN 5000 UNITS EX SOLR
CUTANEOUS | Status: AC
  Filled 2020-08-20: qty 5000

## 2020-08-20 MED ORDER — SENNA 8.6 MG PO TABS
1.0000 | ORAL_TABLET | Freq: Two times a day (BID) | ORAL | Status: DC
  Administered 2020-08-20 – 2020-09-02 (×25): 8.6 mg via ORAL
  Filled 2020-08-20 (×26): qty 1

## 2020-08-20 MED ORDER — RELUGOLIX-ESTRADIOL-NORETHIND 40-1-0.5 MG PO TABS: 1.0000 | ORAL_TABLET | Freq: Every day | ORAL | Status: DC

## 2020-08-20 MED ORDER — ONDANSETRON HCL 4 MG/2ML IJ SOLN
4.0000 mg | Freq: Four times a day (QID) | INTRAMUSCULAR | Status: DC | PRN
  Filled 2020-08-20: qty 2

## 2020-08-20 MED ORDER — DEXAMETHASONE SODIUM PHOSPHATE 10 MG/ML IJ SOLN
INTRAMUSCULAR | Status: AC
  Filled 2020-08-20: qty 1

## 2020-08-20 MED ORDER — OXYCODONE HCL 5 MG PO TABS
5.0000 mg | ORAL_TABLET | ORAL | Status: DC | PRN
  Administered 2020-08-21 – 2020-08-23 (×14): 10 mg via ORAL
  Administered 2020-08-24 (×2): 5 mg via ORAL
  Administered 2020-08-25 – 2020-08-30 (×20): 10 mg via ORAL
  Administered 2020-08-31: 5 mg via ORAL
  Administered 2020-09-01 – 2020-09-02 (×5): 10 mg via ORAL
  Filled 2020-08-20 (×23): qty 2
  Filled 2020-08-20: qty 1
  Filled 2020-08-20 (×5): qty 2
  Filled 2020-08-20: qty 1
  Filled 2020-08-20 (×13): qty 2
  Filled 2020-08-20: qty 1

## 2020-08-20 MED ORDER — CEFAZOLIN SODIUM-DEXTROSE 2-4 GM/100ML-% IV SOLN
2.0000 g | Freq: Three times a day (TID) | INTRAVENOUS | Status: AC
  Administered 2020-08-20 (×2): 2 g via INTRAVENOUS
  Filled 2020-08-20 (×2): qty 100

## 2020-08-20 MED ORDER — ALBUTEROL SULFATE HFA 108 (90 BASE) MCG/ACT IN AERS
INHALATION_SPRAY | RESPIRATORY_TRACT | Status: AC
  Filled 2020-08-20: qty 6.7

## 2020-08-20 MED ORDER — PHENYLEPHRINE HCL-NACL 10-0.9 MG/250ML-% IV SOLN
INTRAVENOUS | Status: DC | PRN
  Administered 2020-08-20: 25 ug/min via INTRAVENOUS

## 2020-08-20 MED ORDER — MIDAZOLAM HCL 2 MG/2ML IJ SOLN
INTRAMUSCULAR | Status: AC
  Filled 2020-08-20: qty 2

## 2020-08-20 MED ORDER — PROMETHAZINE HCL 25 MG/ML IJ SOLN
6.2500 mg | INTRAMUSCULAR | Status: DC | PRN
  Administered 2020-08-20: 6.25 mg via INTRAVENOUS

## 2020-08-20 MED ORDER — BUPIVACAINE HCL (PF) 0.5 % IJ SOLN
INTRAMUSCULAR | Status: DC | PRN
  Administered 2020-08-20: 10 mL

## 2020-08-20 MED ORDER — ALBUTEROL SULFATE HFA 108 (90 BASE) MCG/ACT IN AERS
INHALATION_SPRAY | RESPIRATORY_TRACT | Status: DC | PRN
  Administered 2020-08-20: 5 via RESPIRATORY_TRACT

## 2020-08-20 MED ORDER — PHENYLEPHRINE 40 MCG/ML (10ML) SYRINGE FOR IV PUSH (FOR BLOOD PRESSURE SUPPORT)
PREFILLED_SYRINGE | INTRAVENOUS | Status: AC
  Filled 2020-08-20: qty 10

## 2020-08-20 MED ORDER — ROCURONIUM BROMIDE 10 MG/ML (PF) SYRINGE
PREFILLED_SYRINGE | INTRAVENOUS | Status: DC | PRN
  Administered 2020-08-20: 60 mg via INTRAVENOUS
  Administered 2020-08-20: 20 mg via INTRAVENOUS

## 2020-08-20 MED ORDER — PROPOFOL 10 MG/ML IV BOLUS
INTRAVENOUS | Status: AC
  Filled 2020-08-20: qty 40

## 2020-08-20 MED ORDER — PREGABALIN 75 MG PO CAPS
75.0000 mg | ORAL_CAPSULE | Freq: Two times a day (BID) | ORAL | Status: DC
  Administered 2020-08-20 – 2020-09-02 (×26): 75 mg via ORAL
  Filled 2020-08-20 (×26): qty 1

## 2020-08-20 MED ORDER — PROMETHAZINE HCL 25 MG/ML IJ SOLN
INTRAMUSCULAR | Status: AC
  Filled 2020-08-20: qty 1

## 2020-08-20 MED ORDER — ALBUMIN HUMAN 5 % IV SOLN: INTRAVENOUS | Status: DC | PRN

## 2020-08-20 MED ORDER — HYDROXYZINE HCL 50 MG PO TABS
50.0000 mg | ORAL_TABLET | Freq: Every day | ORAL | Status: DC
  Administered 2020-08-20 – 2020-09-01 (×13): 50 mg via ORAL
  Filled 2020-08-20 (×13): qty 1

## 2020-08-20 MED ORDER — ACETAMINOPHEN 10 MG/ML IV SOLN
INTRAVENOUS | Status: AC
  Filled 2020-08-20: qty 100

## 2020-08-20 MED ORDER — KETOROLAC TROMETHAMINE 15 MG/ML IJ SOLN
15.0000 mg | Freq: Four times a day (QID) | INTRAMUSCULAR | Status: DC
  Administered 2020-08-20 – 2020-08-21 (×3): 15 mg via INTRAVENOUS
  Filled 2020-08-20 (×3): qty 1

## 2020-08-20 MED ORDER — SODIUM CHLORIDE 0.9 % IV SOLN: 250.0000 mL | INTRAVENOUS | Status: DC

## 2020-08-20 MED ORDER — FLEET ENEMA 7-19 GM/118ML RE ENEM: 1.0000 | ENEMA | Freq: Once | RECTAL | Status: DC | PRN

## 2020-08-20 MED ORDER — DEXAMETHASONE SODIUM PHOSPHATE 10 MG/ML IJ SOLN
INTRAMUSCULAR | Status: DC | PRN
  Administered 2020-08-20: 10 mg via INTRAVENOUS

## 2020-08-20 MED ORDER — CLONIDINE HCL 0.1 MG PO TABS
0.1000 mg | ORAL_TABLET | Freq: Every day | ORAL | Status: DC
  Administered 2020-08-21 – 2020-09-02 (×13): 0.1 mg via ORAL
  Filled 2020-08-20 (×13): qty 1

## 2020-08-20 MED ORDER — FENTANYL CITRATE (PF) 100 MCG/2ML IJ SOLN
INTRAMUSCULAR | Status: AC
  Filled 2020-08-20: qty 2

## 2020-08-20 MED ORDER — BUPIVACAINE HCL (PF) 0.5 % IJ SOLN
INTRAMUSCULAR | Status: AC
  Filled 2020-08-20: qty 30

## 2020-08-20 MED ORDER — DOCUSATE SODIUM 100 MG PO CAPS
100.0000 mg | ORAL_CAPSULE | Freq: Two times a day (BID) | ORAL | Status: DC
  Administered 2020-08-20 – 2020-09-02 (×25): 100 mg via ORAL
  Filled 2020-08-20 (×26): qty 1

## 2020-08-20 MED ORDER — ROCURONIUM BROMIDE 10 MG/ML (PF) SYRINGE
PREFILLED_SYRINGE | INTRAVENOUS | Status: AC
  Filled 2020-08-20: qty 10

## 2020-08-20 MED ORDER — TIZANIDINE HCL 2 MG PO TABS
4.0000 mg | ORAL_TABLET | Freq: Four times a day (QID) | ORAL | Status: DC | PRN
  Administered 2020-08-20 – 2020-09-02 (×23): 4 mg via ORAL
  Filled 2020-08-20: qty 1
  Filled 2020-08-20 (×8): qty 2
  Filled 2020-08-20: qty 1
  Filled 2020-08-20 (×13): qty 2

## 2020-08-20 MED ORDER — LACTATED RINGERS IV SOLN: INTRAVENOUS | Status: DC | PRN

## 2020-08-20 MED ORDER — CHLORHEXIDINE GLUCONATE CLOTH 2 % EX PADS: 6.0000 | MEDICATED_PAD | Freq: Once | CUTANEOUS | Status: DC

## 2020-08-20 MED ORDER — ONDANSETRON HCL 4 MG PO TABS: 4.0000 mg | ORAL_TABLET | Freq: Four times a day (QID) | ORAL | Status: DC | PRN

## 2020-08-20 MED ORDER — THROMBIN 5000 UNITS EX SOLR
OROMUCOSAL | Status: DC | PRN
  Administered 2020-08-20: 5 mL via TOPICAL

## 2020-08-20 MED ORDER — LACTATED RINGERS IV SOLN: INTRAVENOUS | Status: DC

## 2020-08-20 MED ORDER — PHENYLEPHRINE 40 MCG/ML (10ML) SYRINGE FOR IV PUSH (FOR BLOOD PRESSURE SUPPORT)
PREFILLED_SYRINGE | INTRAVENOUS | Status: DC | PRN
  Administered 2020-08-20: 80 ug via INTRAVENOUS
  Administered 2020-08-20: 40 ug via INTRAVENOUS
  Administered 2020-08-20: 80 ug via INTRAVENOUS

## 2020-08-20 MED ORDER — FENTANYL CITRATE (PF) 100 MCG/2ML IJ SOLN: INTRAMUSCULAR | Status: DC | PRN

## 2020-08-20 MED ORDER — LIDOCAINE-EPINEPHRINE 1 %-1:100000 IJ SOLN
INTRAMUSCULAR | Status: DC | PRN
  Administered 2020-08-20: 10 mL

## 2020-08-20 MED ORDER — METHOCARBAMOL 1000 MG/10ML IJ SOLN
500.0000 mg | Freq: Four times a day (QID) | INTRAVENOUS | Status: DC | PRN
  Filled 2020-08-20: qty 5

## 2020-08-20 MED ORDER — CITALOPRAM HYDROBROMIDE 20 MG PO TABS
20.0000 mg | ORAL_TABLET | Freq: Every day | ORAL | Status: DC
  Administered 2020-08-21 – 2020-09-02 (×13): 20 mg via ORAL
  Filled 2020-08-20 (×13): qty 1

## 2020-08-20 MED ORDER — BISACODYL 10 MG RE SUPP
10.0000 mg | Freq: Every day | RECTAL | Status: DC | PRN
  Administered 2020-08-26: 10 mg via RECTAL
  Filled 2020-08-20: qty 1

## 2020-08-20 MED ORDER — PANTOPRAZOLE SODIUM 40 MG PO TBEC
40.0000 mg | DELAYED_RELEASE_TABLET | Freq: Every day | ORAL | Status: DC
  Administered 2020-08-21 – 2020-09-02 (×13): 40 mg via ORAL
  Filled 2020-08-20 (×13): qty 1

## 2020-08-20 MED ORDER — FENTANYL CITRATE (PF) 250 MCG/5ML IJ SOLN
INTRAMUSCULAR | Status: AC
  Filled 2020-08-20: qty 5

## 2020-08-20 MED ORDER — SENNOSIDES-DOCUSATE SODIUM 8.6-50 MG PO TABS
1.0000 | ORAL_TABLET | Freq: Every evening | ORAL | Status: DC | PRN
  Administered 2020-08-22: 1 via ORAL
  Filled 2020-08-20: qty 1

## 2020-08-20 MED ORDER — AMLODIPINE BESYLATE 5 MG PO TABS
5.0000 mg | ORAL_TABLET | Freq: Every day | ORAL | Status: DC
  Administered 2020-08-21 – 2020-09-02 (×13): 5 mg via ORAL
  Filled 2020-08-20 (×13): qty 1

## 2020-08-20 MED ORDER — PHENYLEPHRINE HCL-NACL 10-0.9 MG/250ML-% IV SOLN
INTRAVENOUS | Status: AC
  Filled 2020-08-20: qty 250

## 2020-08-20 MED ORDER — TORSEMIDE 10 MG PO TABS
5.0000 mg | ORAL_TABLET | Freq: Every day | ORAL | Status: DC
  Administered 2020-08-21 – 2020-09-02 (×13): 5 mg via ORAL
  Filled 2020-08-20 (×13): qty 0.5

## 2020-08-20 MED ORDER — SUGAMMADEX SODIUM 200 MG/2ML IV SOLN
INTRAVENOUS | Status: DC | PRN
  Administered 2020-08-20: 200 mg via INTRAVENOUS

## 2020-08-20 MED ORDER — CEFAZOLIN SODIUM-DEXTROSE 2-4 GM/100ML-% IV SOLN
2.0000 g | INTRAVENOUS | Status: AC
  Administered 2020-08-20: 2 g via INTRAVENOUS
  Filled 2020-08-20: qty 100

## 2020-08-20 MED ORDER — SODIUM CHLORIDE 0.9 % IV SOLN: INTRAVENOUS | Status: DC

## 2020-08-20 MED ORDER — LIDOCAINE 2% (20 MG/ML) 5 ML SYRINGE
INTRAMUSCULAR | Status: AC
  Filled 2020-08-20: qty 5

## 2020-08-20 MED ORDER — HYDROMORPHONE HCL 1 MG/ML IJ SOLN
0.5000 mg | INTRAMUSCULAR | Status: DC | PRN
  Administered 2020-08-20 – 2020-08-22 (×2): 1 mg via INTRAVENOUS
  Filled 2020-08-20 (×2): qty 1

## 2020-08-20 MED ORDER — MENTHOL 3 MG MT LOZG: 1.0000 | LOZENGE | OROMUCOSAL | Status: DC | PRN

## 2020-08-20 MED ORDER — ONDANSETRON HCL 4 MG/2ML IJ SOLN
INTRAMUSCULAR | Status: AC
  Filled 2020-08-20: qty 2

## 2020-08-20 MED ORDER — CHLORHEXIDINE GLUCONATE 0.12 % MT SOLN
15.0000 mL | Freq: Once | OROMUCOSAL | Status: AC
  Administered 2020-08-20: 15 mL via OROMUCOSAL
  Filled 2020-08-20: qty 15

## 2020-08-20 MED ORDER — FENTANYL CITRATE (PF) 250 MCG/5ML IJ SOLN
INTRAMUSCULAR | Status: DC | PRN
  Administered 2020-08-20 (×4): 50 ug via INTRAVENOUS
  Administered 2020-08-20: 150 ug via INTRAVENOUS

## 2020-08-20 MED ORDER — LIDOCAINE 2% (20 MG/ML) 5 ML SYRINGE
INTRAMUSCULAR | Status: DC | PRN
  Administered 2020-08-20: 60 mg via INTRAVENOUS

## 2020-08-20 MED ORDER — AMISULPRIDE (ANTIEMETIC) 5 MG/2ML IV SOLN: 10.0000 mg | Freq: Once | INTRAVENOUS | Status: DC | PRN

## 2020-08-20 MED ORDER — 0.9 % SODIUM CHLORIDE (POUR BTL) OPTIME
TOPICAL | Status: DC | PRN
  Administered 2020-08-20: 1000 mL

## 2020-08-20 MED ORDER — MIDAZOLAM HCL 5 MG/5ML IJ SOLN
INTRAMUSCULAR | Status: DC | PRN
  Administered 2020-08-20: 2 mg via INTRAVENOUS

## 2020-08-20 MED ORDER — PHENOL 1.4 % MT LIQD: 1.0000 | OROMUCOSAL | Status: DC | PRN

## 2020-08-20 MED ORDER — PROPOFOL 10 MG/ML IV BOLUS
INTRAVENOUS | Status: DC | PRN
  Administered 2020-08-20: 200 mg via INTRAVENOUS

## 2020-08-20 MED ORDER — LIDOCAINE-EPINEPHRINE 1 %-1:100000 IJ SOLN
INTRAMUSCULAR | Status: AC
  Filled 2020-08-20: qty 1

## 2020-08-20 MED ORDER — KETAMINE HCL 50 MG/5ML IJ SOSY
PREFILLED_SYRINGE | INTRAMUSCULAR | Status: AC
  Filled 2020-08-20: qty 5

## 2020-08-20 MED ORDER — METHOCARBAMOL 500 MG PO TABS
ORAL_TABLET | ORAL | Status: AC
  Filled 2020-08-20: qty 1

## 2020-08-20 MED ORDER — ORAL CARE MOUTH RINSE: 15.0000 mL | Freq: Once | OROMUCOSAL | Status: AC

## 2020-08-20 MED ORDER — KETAMINE HCL 10 MG/ML IJ SOLN
INTRAMUSCULAR | Status: DC | PRN
  Administered 2020-08-20: 20 mg via INTRAVENOUS

## 2020-08-20 MED ORDER — METHOCARBAMOL 500 MG PO TABS
500.0000 mg | ORAL_TABLET | Freq: Four times a day (QID) | ORAL | Status: DC | PRN
  Administered 2020-08-20: 500 mg via ORAL

## 2020-08-20 MED ORDER — SODIUM CHLORIDE 0.9% FLUSH: 3.0000 mL | INTRAVENOUS | Status: DC | PRN

## 2020-08-20 MED ORDER — FENTANYL CITRATE (PF) 100 MCG/2ML IJ SOLN
25.0000 ug | INTRAMUSCULAR | Status: DC | PRN
  Administered 2020-08-20 (×2): 50 ug via INTRAVENOUS

## 2020-08-20 MED ORDER — SODIUM CHLORIDE 0.9% FLUSH: 3.0000 mL | Freq: Two times a day (BID) | INTRAVENOUS | Status: DC

## 2020-08-20 MED ORDER — ONDANSETRON HCL 4 MG/2ML IJ SOLN
INTRAMUSCULAR | Status: DC | PRN
  Administered 2020-08-20: 4 mg via INTRAVENOUS

## 2020-08-20 SURGICAL SUPPLY — 77 items
ADH SKN CLS APL DERMABOND .7 (GAUZE/BANDAGES/DRESSINGS) ×1
APL SKNCLS STERI-STRIP NONHPOA (GAUZE/BANDAGES/DRESSINGS)
BASKET BONE COLLECTION (BASKET) ×2 IMPLANT
BENZOIN TINCTURE PRP APPL 2/3 (GAUZE/BANDAGES/DRESSINGS) IMPLANT
BLADE CLIPPER SURG (BLADE) IMPLANT
BLADE SURG 11 STRL SS (BLADE) ×2 IMPLANT
BUR MATCHSTICK NEURO 3.0 LAGG (BURR) ×2 IMPLANT
BUR PRECISION FLUTE 5.0 (BURR) ×2 IMPLANT
CAGE EXP CATALYFT 9 (Plate) ×2 IMPLANT
CAGE INTERBODY PL LG 7X26.5X24 (Cage) ×2 IMPLANT
CANISTER SUCT 3000ML PPV (MISCELLANEOUS) ×2 IMPLANT
CARTRIDGE OIL MAESTRO DRILL (MISCELLANEOUS) ×1 IMPLANT
CNTNR URN SCR LID CUP LEK RST (MISCELLANEOUS) ×1 IMPLANT
CONT SPEC 4OZ STRL OR WHT (MISCELLANEOUS) ×2
COVER BACK TABLE 60X90IN (DRAPES) ×2 IMPLANT
COVER WAND RF STERILE (DRAPES) ×1 IMPLANT
DECANTER SPIKE VIAL GLASS SM (MISCELLANEOUS) ×2 IMPLANT
DERMABOND ADVANCED (GAUZE/BANDAGES/DRESSINGS) ×1
DERMABOND ADVANCED .7 DNX12 (GAUZE/BANDAGES/DRESSINGS) ×1 IMPLANT
DIFFUSER DRILL AIR PNEUMATIC (MISCELLANEOUS) ×2 IMPLANT
DRAPE C-ARM 42X72 X-RAY (DRAPES) ×2 IMPLANT
DRAPE C-ARMOR (DRAPES) ×2 IMPLANT
DRAPE LAPAROTOMY 100X72X124 (DRAPES) ×2 IMPLANT
DRAPE SURG 17X23 STRL (DRAPES) ×2 IMPLANT
DRSG OPSITE POSTOP 4X6 (GAUZE/BANDAGES/DRESSINGS) ×1 IMPLANT
DURAPREP 26ML APPLICATOR (WOUND CARE) ×2 IMPLANT
ELECT REM PT RETURN 9FT ADLT (ELECTROSURGICAL) ×2
ELECTRODE REM PT RTRN 9FT ADLT (ELECTROSURGICAL) ×1 IMPLANT
GAUZE 4X4 16PLY RFD (DISPOSABLE) IMPLANT
GAUZE SPONGE 4X4 12PLY STRL (GAUZE/BANDAGES/DRESSINGS) IMPLANT
GLOVE BIO SURGEON STRL SZ7.5 (GLOVE) IMPLANT
GLOVE BIOGEL PI IND STRL 7.5 (GLOVE) ×2 IMPLANT
GLOVE BIOGEL PI INDICATOR 7.5 (GLOVE) ×2
GLOVE ECLIPSE 7.0 STRL STRAW (GLOVE) ×4 IMPLANT
GLOVE EXAM NITRILE XL STR (GLOVE) IMPLANT
GOWN STRL REUS W/ TWL LRG LVL3 (GOWN DISPOSABLE) ×2 IMPLANT
GOWN STRL REUS W/ TWL XL LVL3 (GOWN DISPOSABLE) IMPLANT
GOWN STRL REUS W/TWL 2XL LVL3 (GOWN DISPOSABLE) IMPLANT
GOWN STRL REUS W/TWL LRG LVL3 (GOWN DISPOSABLE) ×4
GOWN STRL REUS W/TWL XL LVL3 (GOWN DISPOSABLE)
GRAFT BONE PROTEIOS SM 1CC (Orthopedic Implant) ×1 IMPLANT
HEMOSTAT POWDER KIT SURGIFOAM (HEMOSTASIS) ×2 IMPLANT
KIT BASIN OR (CUSTOM PROCEDURE TRAY) ×2 IMPLANT
KIT POSITION SURG JACKSON T1 (MISCELLANEOUS) ×2 IMPLANT
KIT TURNOVER KIT B (KITS) ×2 IMPLANT
MILL MEDIUM DISP (BLADE) ×2 IMPLANT
NDL HYPO 18GX1.5 BLUNT FILL (NEEDLE) IMPLANT
NDL SPNL 18GX3.5 QUINCKE PK (NEEDLE) IMPLANT
NEEDLE HYPO 18GX1.5 BLUNT FILL (NEEDLE) IMPLANT
NEEDLE HYPO 22GX1.5 SAFETY (NEEDLE) ×2 IMPLANT
NEEDLE SPNL 18GX3.5 QUINCKE PK (NEEDLE) IMPLANT
NS IRRIG 1000ML POUR BTL (IV SOLUTION) ×2 IMPLANT
OIL CARTRIDGE MAESTRO DRILL (MISCELLANEOUS) ×2
PACK LAMINECTOMY NEURO (CUSTOM PROCEDURE TRAY) ×2 IMPLANT
PAD ARMBOARD 7.5X6 YLW CONV (MISCELLANEOUS) ×6 IMPLANT
PATTIES SURGICAL 1X1 (DISPOSABLE) ×1 IMPLANT
PUTTY GRAFTON DBF 6CC W/DELIVE (Putty) ×1 IMPLANT
ROD SOLERA 70MM (Rod) ×4 IMPLANT
ROD SOLERA 70X4.75X (Rod) IMPLANT
SCREW MA THRD TI 6.5X30 (Screw) ×2 IMPLANT
SCREW SET SOLERA (Screw) ×12 IMPLANT
SCREW SET SOLERA TI (Screw) IMPLANT
SCREW SOLERA 6.5X35 (Screw) ×4 IMPLANT
SPONGE LAP 4X18 RFD (DISPOSABLE) IMPLANT
SPONGE SURGIFOAM ABS GEL 100 (HEMOSTASIS) IMPLANT
STRIP CLOSURE SKIN 1/2X4 (GAUZE/BANDAGES/DRESSINGS) IMPLANT
SUT VIC AB 0 CT1 18XCR BRD8 (SUTURE) ×1 IMPLANT
SUT VIC AB 0 CT1 8-18 (SUTURE) ×4
SUT VIC AB 1 CT1 18XBRD ANBCTR (SUTURE) IMPLANT
SUT VIC AB 1 CT1 8-18 (SUTURE) ×2
SUT VIC AB 3-0 FS2 27 (SUTURE) IMPLANT
SUT VICRYL 3-0 RB1 18 ABS (SUTURE) ×3 IMPLANT
SYR 3ML LL SCALE MARK (SYRINGE) ×6 IMPLANT
TOWEL GREEN STERILE (TOWEL DISPOSABLE) ×2 IMPLANT
TOWEL GREEN STERILE FF (TOWEL DISPOSABLE) ×2 IMPLANT
TRAY FOLEY MTR SLVR 16FR STAT (SET/KITS/TRAYS/PACK) ×2 IMPLANT
WATER STERILE IRR 1000ML POUR (IV SOLUTION) ×2 IMPLANT

## 2020-08-20 NOTE — Transfer of Care (Signed)
Immediate Anesthesia Transfer of Care Note  Patient: Kathryn [name]  Procedure(s) Performed: Posterior Lumbar Interbody Fusion Lumbar Three-Four, Lumbar Four-Five (N/A )  Patient Location: PACU  Anesthesia Type:General  Level of Consciousness: awake, oriented and patient cooperative  Airway & Oxygen Therapy: Patient Spontanous Breathing and Patient connected to face mask oxygen  Post-op Assessment: Report given to RN and Post -op Vital signs reviewed and stable  Post vital signs: Reviewed  Last Vitals:  Vitals Value Taken Time  BP 95/66 08/20/20 1255  Temp    Pulse 100 08/20/20 1300  Resp 23 08/20/20 1300  SpO2 98 % 08/20/20 1300  Vitals shown include unvalidated device data.  Last Pain:  Vitals:   08/20/20 0627  TempSrc:   PainSc: 5       Patients Stated Pain Goal: 3 (99/37/16 9678)  Complications: No complications documented.

## 2020-08-20 NOTE — Anesthesia Procedure Notes (Addendum)
Procedure Name: Intubation Date/Time: 08/20/2020 7:58 AM Performed by: Jenne Campus, CRNA Pre-anesthesia Checklist: Patient identified, Emergency Drugs available, Suction available and Patient being monitored Patient Re-evaluated:Patient Re-evaluated prior to induction Oxygen Delivery Method: Circle System Utilized Preoxygenation: Pre-oxygenation with 100% oxygen Induction Type: IV induction Ventilation: Mask ventilation without difficulty Laryngoscope Size: Mac and 3 Grade View: Grade I Tube type: Oral Tube size: 7.0 mm Number of attempts: 1 Airway Equipment and Method: Stylet and Oral airway Placement Confirmation: ETT inserted through vocal cords under direct vision,  positive ETCO2 and breath sounds checked- equal and bilateral Secured at: 21 cm Tube secured with: Tape Dental Injury: Teeth and Oropharynx as per pre-operative assessment  Comments: Atraumatic oral intubation by Kathryn [name]., SRNA.

## 2020-08-20 NOTE — Progress Notes (Signed)
Orthopedic Tech Progress Note Patient Details:  Kathryn [name] 16-Dec-1970 [id number]  Ortho Devices Type of Ortho Device: Lumbar corsett Ortho Device/Splint Location: back Ortho Device/Splint Interventions: Ordered   Post Interventions Patient Tolerated: Other (comment) Instructions Provided: Other (comment)   Ellouise Newer 08/20/2020, 5:14 PM

## 2020-08-21 LAB — CBC
HCT: 34.1 % — ABNORMAL LOW (ref 36.0–46.0)
Hemoglobin: 10.9 g/dL — ABNORMAL LOW (ref 12.0–15.0)
MCH: 28.5 pg (ref 26.0–34.0)
MCHC: 32 g/dL (ref 30.0–36.0)
MCV: 89.3 fL (ref 80.0–100.0)
Platelets: 533 10*3/uL — ABNORMAL HIGH (ref 150–400)
RBC: 3.82 MIL/uL — ABNORMAL LOW (ref 3.87–5.11)
RDW: 15.4 % (ref 11.5–15.5)
WBC: 17.1 10*3/uL — ABNORMAL HIGH (ref 4.0–10.5)
nRBC: 0 % (ref 0.0–0.2)

## 2020-08-21 LAB — BASIC METABOLIC PANEL
Anion gap: 8 (ref 5–15)
BUN: 7 mg/dL (ref 6–20)
CO2: 24 mmol/L (ref 22–32)
Calcium: 9 mg/dL (ref 8.9–10.3)
Chloride: 101 mmol/L (ref 98–111)
Creatinine, Ser: 0.89 mg/dL (ref 0.44–1.00)
GFR, Estimated: >60 mL/min (ref >60)
Glucose, Bld: 110 mg/dL — ABNORMAL HIGH (ref 70–99)
Potassium: 4.6 mmol/L (ref 3.5–5.1)
Sodium: 133 mmol/L — ABNORMAL LOW (ref 135–145)

## 2020-08-21 LAB — PROTIME-INR
INR: 1.1 (ref 0.8–1.2)
Prothrombin Time: 14.5 seconds (ref 11.4–15.2)

## 2020-08-21 LAB — APTT: aPTT: 26 seconds (ref 24–36)

## 2020-08-21 MED ORDER — METHOCARBAMOL 500 MG PO TABS
1000.0000 mg | ORAL_TABLET | Freq: Four times a day (QID) | ORAL | Status: DC | PRN
  Administered 2020-08-21 – 2020-09-01 (×7): 1000 mg via ORAL
  Filled 2020-08-21 (×10): qty 2

## 2020-08-21 MED ORDER — ALUM & MAG HYDROXIDE-SIMETH 200-200-20 MG/5ML PO SUSP
30.0000 mL | ORAL | Status: DC | PRN
  Administered 2020-08-21 – 2020-08-29 (×2): 30 mL via ORAL
  Filled 2020-08-21 (×2): qty 30

## 2020-08-21 NOTE — Evaluation (Signed)
Physical Therapy Evaluation Patient Details Name: Kathryn [name] MRN: [id number] DOB: 04/23/1970 Today's Date: 08/21/2020   History of Present Illness  Pt is a 50 y.o. F who presents s/p L3-5 PLIF. Significant PMH: bipolar disorder, COPD, L patella-femoral arthroplasty.  Clinical Impression  Patient is s/p above surgery resulting in the deficits listed below (see PT Problem List). Pt plans to discharge home with her significant other to a level entry apartment. Pt presents with weakness (RLE weaker than LLE), balance deficits, decreased endurance, muscle spasms and pain. Pt ambulating x 200 feet with a walker at a min guard assist level. Education provided regarding spinal precautions, car transfer technique, generalized walking program. Patient will benefit from skilled PT to increase their independence and safety with mobility (while adhering to their precautions) to allow discharge to the venue listed below.     Follow Up Recommendations Home health PT;Supervision for mobility/OOB    Equipment Recommendations  Rolling walker with 5" wheels (youth)    Recommendations for Other Services       Precautions / Restrictions Precautions Precautions: Back;Fall Precaution Booklet Issued: Yes (comment) Precaution Comments: Verbally reviewed, provided written handout Required Braces or Orthoses: Spinal Brace Spinal Brace: Lumbar corset;Applied in sitting position Restrictions Weight Bearing Restrictions: No      Mobility  Bed Mobility Overal bed mobility: Needs Assistance Bed Mobility: Rolling;Sidelying to Sit Rolling: Modified independent (Device/Increase time) Sidelying to sit: Supervision       General bed mobility comments: Cues for log roll technique, HOB elevated, use of rail, increased time/effort    Transfers Overall transfer level: Needs assistance Equipment used: Rolling walker (2 wheeled) Transfers: Sit to/from Stand Sit to Stand: Min guard         General  transfer comment: Cues for hand placement, increased time to rise, pt twisting and reaching L hand for R walker handle. Instructed her to avoid doing this in light of spinal precautions  Ambulation/Gait Ambulation/Gait assistance: Min guard Gait Distance (Feet): 200 Feet Assistive device: Rolling walker (2 wheeled) Gait Pattern/deviations: Step-through pattern;Decreased stride length;Trendelenburg Gait velocity: decreased Gait velocity interpretation: <1.8 ft/sec, indicate of risk for recurrent falls General Gait Details: Trendelenberg gait pattern, slow speed, no overt LOB.  Stairs            Wheelchair Mobility    Modified Rankin (Stroke Patients Only)       Balance Overall balance assessment: Needs assistance Sitting-balance support: Feet supported Sitting balance-Leahy Scale: Good     Standing balance support: Bilateral upper extremity supported Standing balance-Leahy Scale: Poor Standing balance comment: reliant on external support                             Pertinent Vitals/Pain Pain Assessment: Faces Faces Pain Scale: Hurts even more Pain Location: low back, BLE's Pain Descriptors / Indicators: Spasm;Operative site guarding;Grimacing Pain Intervention(s): Limited activity within patient's tolerance;Monitored during session;Patient requesting pain meds-RN notified    Home Living Family/patient expects to be discharged to:: Private residence Living Arrangements: Spouse/significant other Available Help at Discharge: Family;Available PRN/intermittently (lives with boyfriend; daughter available PRN) Type of Home: Apartment Home Access: Level entry     Home Layout: One level Home Equipment: Cane - single point;Toilet riser      Prior Function Level of Independence: Needs assistance   Gait / Transfers Assistance Needed: using cane  ADL's / Homemaking Assistance Needed: daughter assisting with LB dressing        Hand  Dominance         Extremity/Trunk Assessment   Upper Extremity Assessment Upper Extremity Assessment: Defer to OT evaluation    Lower Extremity Assessment Lower Extremity Assessment: RLE deficits/detail;LLE deficits/detail RLE Deficits / Details: Hip flexion 2/5, knee extension 3+/5, ankle dorsiflexion 3/5 LLE Deficits / Details: Grossly 4/5    Cervical / Trunk Assessment Cervical / Trunk Assessment: Other exceptions Cervical / Trunk Exceptions: s/p PLIF  Communication   Communication: No difficulties  Cognition Arousal/Alertness: Awake/alert Behavior During Therapy: WFL for tasks assessed/performed Overall Cognitive Status: Within Functional Limits for tasks assessed                                        General Comments      Exercises     Assessment/Plan    PT Assessment Patient needs continued PT services  PT Problem List Decreased strength;Decreased activity tolerance;Decreased balance;Decreased mobility;Decreased knowledge of precautions;Pain       PT Treatment Interventions DME instruction;Gait training;Functional mobility training;Balance training;Therapeutic exercise;Therapeutic activities;Patient/family education    PT Goals (Current goals can be found in the Care Plan section)  Acute Rehab PT Goals Patient Stated Goal: more help at home PT Goal Formulation: With patient Time For Goal Achievement: 09/04/20 Potential to Achieve Goals: Good    Frequency Min 5X/week   Barriers to discharge Decreased caregiver support      Co-evaluation               AM-PAC PT "6 Clicks" Mobility  Outcome Measure Help needed turning from your back to your side while in a flat bed without using bedrails?: None Help needed moving from lying on your back to sitting on the side of a flat bed without using bedrails?: A Little Help needed moving to and from a bed to a chair (including a wheelchair)?: A Little Help needed standing up from a chair using your arms (e.g.,  wheelchair or bedside chair)?: A Little Help needed to walk in hospital room?: A Little Help needed climbing 3-5 steps with a railing? : A Lot 6 Click Score: 18    End of Session Equipment Utilized During Treatment: Gait belt;Back brace Activity Tolerance: Patient tolerated treatment well Patient left: in bed;with call bell/phone within reach Nurse Communication: Mobility status;Patient requests pain meds PT Visit Diagnosis: Pain;Difficulty in walking, not elsewhere classified (R26.2) Pain - part of body:  (back)    Time: 5681-2751 PT Time Calculation (min) (ACUTE ONLY): 33 min   Charges:   PT Evaluation $PT Eval Low Complexity: 1 Low PT Treatments $Therapeutic Activity: 8-22 mins        Wyona Almas, PT, DPT Acute Rehabilitation Services Pager [phone] Office [place] 08/21/2020, 8:44 AM

## 2020-08-21 NOTE — Progress Notes (Signed)
Subjective: The patient is alert and pleasant.  She is obviously uncomfortable.  She complains of leg pain.  She wants to stay until tomorrow.  Objective: Vital signs in last 24 hours: Temp:  [97 F (36.1 C)-98.2 F (36.8 C)] 97.9 F (36.6 C) (05/07 0800) Pulse Rate:  [73-115] 73 (05/07 0800) Resp:  [15-20] 16 (05/07 0800) BP: (95-151)/(59-90) 114/73 (05/07 0800) SpO2:  [94 %-100 %] 100 % (05/07 0800) Estimated body mass index is 37.77 kg/m as calculated from the following:   Height as of this encounter: 4\' 11"  (1.499 m).   Weight as of this encounter: 84.8 kg.   Intake/Output from previous day: 05/06 0701 - 05/07 0700 In: 2150 [I.V.:1800; IV Piggyback:350] Out: 825 [Urine:525; Blood:300] Intake/Output this shift: No intake/output data recorded.  Physical exam the patient is alert and pleasant.  Her lower extremity strength is grossly normal.  Her dressing has a small bloodstain.  Lab Results: Recent Labs    08/18/20 1047 08/21/20 0459  WBC 12.6* 17.1*  HGB 12.6 10.9*  HCT 38.9 34.1*  PLT 643* 533*   BMET Recent Labs    08/18/20 1047 08/21/20 0459  NA 132* 133*  K 4.2 4.6  CL 101 101  CO2 25 24  GLUCOSE 98 110*  BUN 13 7  CREATININE 0.73 0.89  CALCIUM 8.8* 9.0    Studies/Results: DG Lumbar Spine 2-3 Views  Result Date: 08/20/2020 CLINICAL DATA:  Surgery, elective. Additional provided: Lumbar 3-4, lumbar 4-5 posterior lumbar interbody fusion. Provided fluoroscopy time 43.1 seconds (35.51 mGy). EXAM: DG C-ARM 1-60 MIN; LUMBAR SPINE - 2-3 VIEW COMPARISON:  Lumbar spine MRI 02/24/2020. FINDINGS: AP and lateral view intraoperative fluoroscopic images of the lumbar spine are submitted, 2 images total. The lowest well-formed intervertebral disc space is designated L5-S1. On the provided images, a posterior spinal fusion construct is present at the L3-L5 levels (bilateral pedicle screws and vertical interconnecting rods). Interbody device(s) are also present at the  L3-L4 and L4-L5 levels. Overlying retractors. IMPRESSION: Two intraoperative fluoroscopic images from L3-L5 fusion, as described. Electronically Signed   By: Kellie Simmering DO   On: 08/20/2020 13:03   DG C-Arm 1-60 Min  Result Date: 08/20/2020 CLINICAL DATA:  Surgery, elective. Additional provided: Lumbar 3-4, lumbar 4-5 posterior lumbar interbody fusion. Provided fluoroscopy time 43.1 seconds (35.51 mGy). EXAM: DG C-ARM 1-60 MIN; LUMBAR SPINE - 2-3 VIEW COMPARISON:  Lumbar spine MRI 02/24/2020. FINDINGS: AP and lateral view intraoperative fluoroscopic images of the lumbar spine are submitted, 2 images total. The lowest well-formed intervertebral disc space is designated L5-S1. On the provided images, a posterior spinal fusion construct is present at the L3-L5 levels (bilateral pedicle screws and vertical interconnecting rods). Interbody device(s) are also present at the L3-L4 and L4-L5 levels. Overlying retractors. IMPRESSION: Two intraoperative fluoroscopic images from L3-L5 fusion, as described. Electronically Signed   By: Kellie Simmering DO   On: 08/20/2020 13:03    Assessment/Plan: Postop day #1: We will mobilize the patient with PT and OT.  She may go home tomorrow.  LOS: 1 day     Ophelia Charter 08/21/2020, 9:16 AM

## 2020-08-21 NOTE — Evaluation (Signed)
Occupational Therapy Evaluation Patient Details Name: Kathryn [name] MRN: [id number] DOB: Sep 04, 1970 Today's Date: 08/21/2020    History of Present Illness Pt is a 50 y.o. F who presents s/p L3-5 PLIF. Significant PMH: bipolar disorder, COPD, L patella-femoral arthroplasty.   Clinical Impression   Patient admitted for the above diagnosis and procedure.  PTA she lived alone with assist from her daughter for lower body dressing, heavy home management and community mobility.  Barriers are listed below.  Currently she is needing up to [place] for basic mobility, and lower body dressing with hip kit.  Patient is staying one more day per MD for pain control.  OT will follow up to ensure compliance with back precautions and use of hip kit.      Follow Up Recommendations  No OT follow up    Equipment Recommendations  Other (comment) (hip kit issued)    Recommendations for Other Services       Precautions / Restrictions Precautions Precautions: Back;Fall Precaution Booklet Issued: Yes (comment) Precaution Comments: Verbally reviewed, provided written handout Required Braces or Orthoses: Spinal Brace Spinal Brace: Lumbar corset;Applied in sitting position;Applied in standing position Restrictions Weight Bearing Restrictions: No      Mobility Bed Mobility Overal bed mobility: Needs Assistance Bed Mobility: Sidelying to Sit;Sit to Sidelying Rolling: Modified independent (Device/Increase time) Sidelying to sit: Supervision     Sit to sidelying: Min assist General bed mobility comments: Cues for log roll technique, HOB elevated, use of rail, increased time/effort Patient Response: Restless  Transfers Overall transfer level: Needs assistance Equipment used: Rolling walker (2 wheeled) Transfers: Sit to/from Stand Sit to Stand: Supervision;Min guard         General transfer comment: Cues for hand placement, increased time to rise, pt twisting and reaching L hand for R walker handle.  Instructed her to avoid doing this in light of spinal precautions    Balance Overall balance assessment: Needs assistance Sitting-balance support: Feet supported Sitting balance-Leahy Scale: Good     Standing balance support: Bilateral upper extremity supported Standing balance-Leahy Scale: Poor Standing balance comment: reliant on external support                           ADL either performed or assessed with clinical judgement   ADL Overall ADL's : Needs assistance/impaired Eating/Feeding: Independent   Grooming: Wash/dry hands;Wash/dry face;Supervision/safety;Standing;Sitting   Upper Body Bathing: Set up;Sitting   Lower Body Bathing: Minimal assistance;Sit to/from stand   Upper Body Dressing : Minimal assistance;Sitting   Lower Body Dressing: Minimal assistance;Sit to/from stand   Toilet Transfer: Supervision/safety;RW           Functional mobility during ADLs: Supervision/safety;Rolling walker       Vision Patient Visual Report: No change from baseline       Perception     Praxis      Pertinent Vitals/Pain Pain Assessment: Faces Faces Pain Scale: Hurts whole lot Pain Location: low back, BLE's Pain Descriptors / Indicators: Spasm;Operative site guarding;Grimacing Pain Intervention(s): Monitored during session     Hand Dominance Right   Extremity/Trunk Assessment Upper Extremity Assessment Upper Extremity Assessment: Overall WFL for tasks assessed   Lower Extremity Assessment Lower Extremity Assessment: Defer to PT evaluation RLE Deficits / Details: Hip flexion 2/5, knee extension 3+/5, ankle dorsiflexion 3/5 LLE Deficits / Details: Grossly 4/5   Cervical / Trunk Assessment Cervical / Trunk Assessment: Other exceptions Cervical / Trunk Exceptions: s/p PLIF   Communication  Communication Communication: No difficulties   Cognition Arousal/Alertness: Awake/alert Behavior During Therapy: WFL for tasks assessed/performed Overall  Cognitive Status: Within Functional Limits for tasks assessed                                                      Home Living Family/patient expects to be discharged to:: Private residence Living Arrangements: Spouse/significant other Available Help at Discharge: Family;Available PRN/intermittently Type of Home: Apartment Home Access: Level entry     Home Layout: One level     Bathroom Shower/Tub: Teacher, early years/pre: Standard     Home Equipment: Cane - single point;Toilet riser          Prior Functioning/Environment Level of Independence: Needs assistance  Gait / Transfers Assistance Needed: using cane ADL's / Homemaking Assistance Needed: daughter assisting with LB dressing            OT Problem List: Impaired balance (sitting and/or standing);Decreased knowledge of use of DME or AE;Pain      OT Treatment/Interventions: Self-care/ADL training;DME and/or AE instruction;Therapeutic activities;Balance training    OT Goals(Current goals can be found in the care plan section) Acute Rehab OT Goals Patient Stated Goal: Be able to move and do more pain free. OT Goal Formulation: With patient Time For Goal Achievement: 09/04/20 Potential to Achieve Goals: Good ADL Goals Pt Will Perform Grooming: with modified independence;sitting;standing Pt Will Perform Lower Body Bathing: with modified independence;sit to/from stand Pt Will Perform Lower Body Dressing: with modified independence;sit to/from stand;with adaptive equipment Pt Will Transfer to Toilet: with modified independence;ambulating;regular height toilet Pt Will Perform Toileting - Clothing Manipulation and hygiene: with modified independence;sit to/from stand  OT Frequency: Min 2X/week   Barriers to D/C:    none noted       Co-evaluation              AM-PAC OT "6 Clicks" Daily Activity     Outcome Measure Help from another person eating meals?: None Help from  another person taking care of personal grooming?: None Help from another person toileting, which includes using toliet, bedpan, or urinal?: A Little Help from another person bathing (including washing, rinsing, drying)?: A Little Help from another person to put on and taking off regular upper body clothing?: A Little Help from another person to put on and taking off regular lower body clothing?: A Little 6 Click Score: 20   End of Session Equipment Utilized During Treatment: Back brace Nurse Communication: Mobility status  Activity Tolerance: Patient limited by pain Patient left: in bed;with call bell/phone within reach  OT Visit Diagnosis: Unsteadiness on feet (R26.81);Pain                Time: 1829-9371 OT Time Calculation (min): 31 min Charges:  OT General Charges $OT Visit: 1 Visit OT Evaluation $OT Eval Moderate Complexity: 1 Mod OT Treatments $Self Care/Home Management : 8-22 mins  08/21/2020  Rich, OTR/L  Acute Rehabilitation Services  Office:  [place] 08/21/2020, 9:57 AM

## 2020-08-21 NOTE — Progress Notes (Signed)
Patient is transferred from room 3C11 to unit 6N31 at this time. Alert and in stable condition. Report given to receiving nurse Suezanne Jacquet, RN with all questions answered. Transported via bed with all belongings at side.

## 2020-08-21 NOTE — Anesthesia Postprocedure Evaluation (Signed)
Anesthesia Post Note  Patient: Kathryn [name]  Procedure(s) Performed: Posterior Lumbar Interbody Fusion Lumbar Three-Four, Lumbar Four-Five (N/A )     Patient location during evaluation: PACU Anesthesia Type: General Level of consciousness: awake Pain management: pain level controlled Vital Signs Assessment: post-procedure vital signs reviewed and stable Respiratory status: spontaneous breathing, nonlabored ventilation, respiratory function stable and patient connected to nasal cannula oxygen Cardiovascular status: blood pressure returned to baseline and stable Postop Assessment: no apparent nausea or vomiting Anesthetic complications: no   No complications documented.  Last Vitals:  Vitals:   08/21/20 0019 08/21/20 0448  BP: 111/67 139/79  Pulse: 75 82  Resp: 18 18  Temp: 36.8 C 36.7 C  SpO2: 99% 100%    Last Pain:  Vitals:   08/21/20 0532  TempSrc:   PainSc: Asleep                 Thyra Yinger P [place]

## 2020-08-22 MED ORDER — OXYCODONE HCL ER 15 MG PO T12A
15.0000 mg | EXTENDED_RELEASE_TABLET | Freq: Two times a day (BID) | ORAL | Status: DC
  Administered 2020-08-22 – 2020-09-02 (×22): 15 mg via ORAL
  Filled 2020-08-22 (×22): qty 1

## 2020-08-22 NOTE — Progress Notes (Signed)
Occupational Therapy Treatment Patient Details Name: Kathryn [name] MRN: [id number] DOB: [birth year] Today's Date: 08/22/2020    History of present illness Pt is a 50 y.o. F who presents s/p L3-5 PLIF. Significant PMH: bipolar disorder, COPD, L patella-femoral arthroplasty.   OT comments  Pt very limited this session as new symptoms: pt's knees to toes are feeling numb and hands/fingertips are numb. Pt repositioned in bed to see if pain/numbness could be relieved with little relief and numbness still present.  RN aware and seeing if pain meds/nerve pain meds will assist.  Encouraged pt to tell RN if numbness continues in the next 30 mins and to tell MD new symptoms. OT to reassess at next visit for d/c disposition as pt was so limited today. Pt requires continued OT skilled services. OT following acutely.    Follow Up Recommendations  Home health OT;Supervision - Intermittent (based on progress next session)    Equipment Recommendations  Other (comment) (pt provided hip kit)    Recommendations for Other Services      Precautions / Restrictions Precautions Precautions: Back;Fall Precaution Comments: Verbally reviewed Required Braces or Orthoses: Spinal Brace Spinal Brace: Lumbar corset Restrictions Weight Bearing Restrictions: No       Mobility Bed Mobility Overal bed mobility: Needs Assistance Bed Mobility: Rolling Rolling: Mod assist         General bed mobility comments: Pt aware of log roll and rolling from R side to back to R side in order to relieve pressure; pt very uncomfortable and reporting new numbness. modA to scoot towards HOB and head and legs elevated with pillow between thighs    Transfers                 General transfer comment: deferred due to numbness    Balance                                           ADL either performed or assessed with clinical judgement   ADL Overall ADL's : Needs assistance/impaired                      Lower Body Dressing: Total assistance Lower Body Dressing Details (indicate cue type and reason): unable to feel feet; unable to complete self care tasks             Functional mobility during ADLs: Moderate assistance (bed mobility) General ADL Comments: Pt very limited this session as pt's knees to toes are feeling numb and hands/fingertips are numb. RN aware and seeing if pain meds/nerve pain meds will assist.     Vision       Perception     Praxis      Cognition Arousal/Alertness: Awake/alert Behavior During Therapy: WFL for tasks assessed/performed Overall Cognitive Status: Within Functional Limits for tasks assessed                                          Exercises     Shoulder Instructions       General Comments Bandage is dry.    Pertinent Vitals/ Pain       Pain Assessment: Faces Faces Pain Scale: Hurts even more Pain Location: low back Pain Descriptors / Indicators: Discomfort;Burning;Numbness;Spasm Pain Intervention(s): Monitored during session;Premedicated before  session;Repositioned;Patient requesting pain meds-RN notified;Limited activity within patient's tolerance;Other (comment) (called RN to let her know that pt was still feeling numb from knees down with a baseline of neuropathy.)  Home Living                                          Prior Functioning/Environment              Frequency  Min 2X/week        Progress Toward Goals  OT Goals(current goals can now be found in the care plan section)  Progress towards OT goals: Not progressing toward goals - comment (pt numb in hands and from the knees down.)  Acute Rehab OT Goals Patient Stated Goal: Be able to move and do more pain free. OT Goal Formulation: With patient Time For Goal Achievement: 09/04/20 Potential to Achieve Goals: Good ADL Goals Pt Will Perform Grooming: with modified independence;sitting;standing Pt Will Perform  Lower Body Bathing: with modified independence;sit to/from stand Pt Will Perform Lower Body Dressing: with modified independence;sit to/from stand;with adaptive equipment Pt Will Transfer to Toilet: with modified independence;ambulating;regular height toilet Pt Will Perform Toileting - Clothing Manipulation and hygiene: with modified independence;sit to/from stand  Plan Discharge plan remains appropriate    Co-evaluation                 AM-PAC OT "6 Clicks" Daily Activity     Outcome Measure   Help from another person eating meals?: A Little Help from another person taking care of personal grooming?: A Little Help from another person toileting, which includes using toliet, bedpan, or urinal?: Total Help from another person bathing (including washing, rinsing, drying)?: Total Help from another person to put on and taking off regular upper body clothing?: A Little Help from another person to put on and taking off regular lower body clothing?: Total 6 Click Score: 12    End of Session    OT Visit Diagnosis: Unsteadiness on feet (R26.81);Pain Pain - part of body:  (back)   Activity Tolerance No increased pain;Other (comment) (numbness)   Patient Left in bed;with call bell/phone within reach;with bed alarm set   Nurse Communication Mobility status;Patient requests pain meds;Other (comment) (numbness)        Time: 9147-8295 OT Time Calculation (min): 12 min  Charges: OT General Charges $OT Visit: 1 Visit OT Treatments $Therapeutic Activity: 8-22 mins  Jefferey Pica, OTR/L Acute Rehabilitation Services Pager: [phone] Office: [phone]    Adonis Ryther C 08/22/2020, 10:16 AM

## 2020-08-22 NOTE — Progress Notes (Signed)
Patient continue to complain of increased pain and numbness.No changes/improvements despite interventions attempted. Notified MD received  new order.

## 2020-08-22 NOTE — Progress Notes (Signed)
Patient ID: Kathryn [name], female   DOB: 1971-03-17, 50 y.o.   MRN: [id number] BP 91/61 (BP Location: Left Arm)   Pulse 74   Temp 98.5 F (36.9 C)   Resp 18   Ht 4\' 11"  (1.499 m)   Wt 84.8 kg   SpO2 100%   BMI 37.77 kg/m  Alert and oriented.x4 Was stating she could not feel or move her lower extremities until SCD's were placed on his legs. Then she could move her right leg and foot. When asked to roll she discovered that her left thigh and leg could also move.  She felt that the dilaudid, caused her paralysis.  Wound is clean, and dry

## 2020-08-22 NOTE — Progress Notes (Signed)
PT Cancellation Note  Patient Details Name: Kathryn [name] MRN: [id number] DOB: May 31, 1970   Cancelled Treatment:    Reason Eval/Treat Not Completed: Other (comment) Pt politely declining PT due to muscle spasms; reports RN is placing IV for pain medication. Of note, pt with new BLE numbness from knees down. This was not present yesterday. RN notified.   Wyona Almas, PT, DPT Acute Rehabilitation Services Pager [phone] Office [phone]    Deno Etienne 08/22/2020, 9:52 AM

## 2020-08-23 LAB — URINALYSIS, ROUTINE W REFLEX MICROSCOPIC
Bilirubin Urine: NEGATIVE
Glucose, UA: NEGATIVE mg/dL
Hgb urine dipstick: NEGATIVE
Ketones, ur: NEGATIVE mg/dL
Leukocytes,Ua: NEGATIVE
Nitrite: NEGATIVE
Protein, ur: NEGATIVE mg/dL
Specific Gravity, Urine: 1.013 (ref 1.005–1.030)
pH: 6 (ref 5.0–8.0)

## 2020-08-23 LAB — BASIC METABOLIC PANEL
Anion gap: 9 (ref 5–15)
BUN: 7 mg/dL (ref 6–20)
CO2: 26 mmol/L (ref 22–32)
Calcium: 8.8 mg/dL — ABNORMAL LOW (ref 8.9–10.3)
Chloride: 98 mmol/L (ref 98–111)
Creatinine, Ser: 0.89 mg/dL (ref 0.44–1.00)
GFR, Estimated: >60 mL/min (ref >60)
Glucose, Bld: 112 mg/dL — ABNORMAL HIGH (ref 70–99)
Potassium: 4 mmol/L (ref 3.5–5.1)
Sodium: 133 mmol/L — ABNORMAL LOW (ref 135–145)

## 2020-08-23 MED ORDER — TAMSULOSIN HCL 0.4 MG PO CAPS
0.4000 mg | ORAL_CAPSULE | Freq: Every day | ORAL | Status: DC
  Administered 2020-08-23 – 2020-09-02 (×11): 0.4 mg via ORAL
  Filled 2020-08-23 (×11): qty 1

## 2020-08-23 NOTE — Plan of Care (Signed)
  Problem: Safety: Goal: Ability to remain free from injury will improve Outcome: Progressing   

## 2020-08-23 NOTE — Plan of Care (Signed)
  Problem: Safety: Goal: Ability to remain free from injury will improve 08/23/2020 1345 by Elon Jester, RN Outcome: Progressing 08/23/2020 1344 by Elon Jester, RN Outcome: Progressing   Problem: Education: Goal: Ability to verbalize activity precautions or restrictions will improve 08/23/2020 1345 by Elon Jester, RN Outcome: Progressing 08/23/2020 1344 by Elon Jester, RN Outcome: Progressing

## 2020-08-23 NOTE — Op Note (Signed)
NEUROSURGERY OPERATIVE NOTE   PREOP DIAGNOSIS:  1. 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 Spondylolisthesis, L4-5  POSTOP DIAGNOSIS: Same  PROCEDURE: 1. L3, L4 complete laminectomy with radical facetectomy for decompression of thecal sac and exiting nerve roots, more than would be required for placement of interbody graft 2. Placement of anterior interbody device - Medtronic expandable 74mm cage x2 @ L3-4, 30mm cage x2 @ L4-5 3. Posterior segmental instrumentation using cortical pedicle screws at L3 - L5 - Medtronic Solera 4. Interbody arthrodesis, L3-4, L4-5 5. Use of locally harvested bone autograft 6. Use of non-structural bone allograft - Proteos  SURGEON: Dr. Consuella Lose, MD  ASSISTANT: Ferne Reus, PA-C  ANESTHESIA: General Endotracheal  EBL: 100cc  SPECIMENS: None  DRAINS: None  COMPLICATIONS: None immediate  CONDITION: Hemodynamically stable to PACU  HISTORY: Kathryn [name] is a 50 y.o. female who has been followed in the outpatient clinic with back and leg pain related to spondylosis, multifactorial stenosis, and spondylolisthesis at L3-4 and L4-5. She attempted multiple conservative treatments without significant improvement and she ultimately elected to proceed with surgical decompression and fusion. Risks, benefits, and alternative treatments were reviewed in detail in the office. After all questions were answered, informed consent was obtained and witnessed.  PROCEDURE IN DETAIL: The patient was brought to the operating room via stretcher. After induction of general anesthesia, the patient was positioned on the operative table in the prone position. All pressure points were meticulously padded. Incision was then marked out and prepped and draped in the usual sterile fashion.  After timeout was conducted, skin was infiltrated with local anesthetic. Skin incision was then made sharply and Bovie electrocautery was used to dissect the  subcutaneous tissue until the lumbodorsal fascia was identified and incised. The muscle was then elevated in the subperiosteal plane and the L3, L4 and top of L5 lamina and L3-4 and L4-5 facet complexes were identified. Self-retaining retractors were then placed. Lateral fluoroscopy was taken with a dissector in the L3-4 interspace to confirm our location.  At this point attention was turned to decompression. Complete L3 and L4 laminectomy was completed with a high-speed drill and Kerrison punches.  Normal dura was identified.  A ball-tipped dissector was then used to identify the foramina bilaterally at L3-4 and L4-5.  High-speed drill was used to cut across the pars interarticularis and the inferior articulating process of both L3 and L4 were removed bilaterally.  The exiting L3 and L4 nerve roots and the traversing L5 nerve roots were then identified. Remainder of the roof of the foramen at L3-4 and L4-5 was then removed with Kerrison punches. I was then able to easily pass a ball dissector in the ventral epidural space and over the bilateral L3, L4, and L5 nerves indicating good decompression.   Disc spaces at L3-4 and L4-5 were then identified, incised bilaterally, and using a combination of shavers, curettes and rongeurs, complete discectomy was completed at both levels. Endplates were prepared, and bone harvested during decompression was mixed with Proteos and packed into the interspaces. A 3mm cage was tapped into place at L3-4. Cages were expanded to approximately 28mm.  Good position was confirmed with fluoroscopy. Similarly, 21mm cages were tapped into place at L4-5 and expanded to 35mm. Position again confirmed with fluoro.  At this point, the entry points for bilateral L3, L4, and L5 cortical pedicle screws were identified using standard anatomic landmarks and lateral fluoro. Pilot holes were then drilled and tapped to 5.5 x 40mm.  Screws were then placed in L3, L4, and L5 bilaterally. Prebent  lordotic rod was then sized and placed into the pedicle screws. Set screws were placed and final tightened. Final AP and lateral fluoroscopic images confirmed good position.  Hemostasis was secured and confirmed with bipolar cautery and morcellized gelfoam with thrombin. The wound was then irrigated with copious amounts of antibiotic saline, then closed in standard fashion using a combination of interrupted 0 and 3-0 Vicryl stitches in the muscular, fascial, and subcutaneous layers. Skin was then closed using standard Dermabond. Sterile dressing was then applied. The patient was then transferred to the stretcher, extubated, and taken to the postanesthesia care unit in stable hemodynamic condition.  At the end of the case all sponge, needle, cottonoid, and instrument counts were correct.   Consuella Lose, MD [facility]

## 2020-08-23 NOTE — Progress Notes (Signed)
Physical Therapy Treatment Patient Details Name: Kathryn [name] MRN: [id number] DOB: [birth year] Today's Date: 08/23/2020    History of Present Illness Pt is a 50 y.o. F who presents s/p L3-5 PLIF. Significant PMH: bipolar disorder, COPD, L patella-femoral arthroplasty.    PT Comments    Pt with significant change in function, reporting new numbness in BLE's distal to knees, weakness and burning pain. Pt intact to light touch, but reports she cannot feel her feet on the ground at all or her buttocks. Right ankle dorsiflexion 2/5, left ankle dorsiflexion 1/5 on manual muscle testing. Whereas, two days ago she was ambulating x 200 feet; today she required modA to stand and could not ambulate. RN aware and states MD has already seen pt today. Updated d/c plan to post acute rehab in light of above.    Follow Up Recommendations  CIR     Equipment Recommendations  Rolling walker with 5" wheels;Wheelchair (measurements PT);Wheelchair cushion (measurements PT);3in1 (PT) (youth RW)    Recommendations for Other Services       Precautions / Restrictions Precautions Precautions: Back;Fall Precaution Comments: Verbally reviewed Required Braces or Orthoses: Spinal Brace Spinal Brace: Lumbar corset Restrictions Weight Bearing Restrictions: No    Mobility  Bed Mobility Overal bed mobility: Needs Assistance Bed Mobility: Rolling;Sidelying to Sit;Sit to Sidelying Rolling: Min guard Sidelying to sit: Min guard     Sit to sidelying: Mod assist General bed mobility comments: Pt able to roll with min guard assist, bring BLE's off edge of bed, and use rails to bring trunk to upright. ModA for BLE's back into bed    Transfers Overall transfer level: Needs assistance Equipment used: Rolling walker (2 wheeled) Transfers: Sit to/from Stand Sit to Stand: Mod assist         General transfer comment: ModA to rise to standing position from edge of bed x 2, right foot sliding anteriorly. heavy  reliance through arms on walker  Ambulation/Gait                 Stairs             Wheelchair Mobility    Modified Rankin (Stroke Patients Only)       Balance Overall balance assessment: Needs assistance Sitting-balance support: Feet supported Sitting balance-Leahy Scale: Fair     Standing balance support: Bilateral upper extremity supported Standing balance-Leahy Scale: Poor Standing balance comment: reliant on external support                            Cognition Arousal/Alertness: Awake/alert Behavior During Therapy: WFL for tasks assessed/performed Overall Cognitive Status: Impaired/Different from baseline Area of Impairment: Memory;Awareness                     Memory: Decreased short-term memory     Awareness: Emergent   General Comments: Pt with slightly slurred speech, attributes new weakness/numbness to a medication a RN gave her      Exercises      General Comments        Pertinent Vitals/Pain Pain Assessment: Faces Faces Pain Scale: Hurts whole lot Pain Location: low back, BLE's Pain Descriptors / Indicators: Discomfort;Burning;Numbness;Spasm Pain Intervention(s): Limited activity within patient's tolerance;Monitored during session    Home Living                      Prior Function  PT Goals (current goals can now be found in the care plan section) Acute Rehab PT Goals Patient Stated Goal: Be able to move and do more pain free. Potential to Achieve Goals: Good Progress towards PT goals: Not progressing toward goals - comment    Frequency    Min 5X/week      PT Plan Discharge plan needs to be updated    Co-evaluation              AM-PAC PT "6 Clicks" Mobility   Outcome Measure  Help needed turning from your back to your side while in a flat bed without using bedrails?: A Little Help needed moving from lying on your back to sitting on the side of a flat bed without  using bedrails?: A Little Help needed moving to and from a bed to a chair (including a wheelchair)?: A Little Help needed standing up from a chair using your arms (e.g., wheelchair or bedside chair)?: A Lot Help needed to walk in hospital room?: Total Help needed climbing 3-5 steps with a railing? : Total 6 Click Score: 13    End of Session Equipment Utilized During Treatment: Back brace;Gait belt Activity Tolerance: Patient limited by pain Patient left: in bed;with call bell/phone within reach Nurse Communication: Mobility status PT Visit Diagnosis: Pain;Difficulty in walking, not elsewhere classified (R26.2)     Time: 1628-1700 PT Time Calculation (min) (ACUTE ONLY): 32 min  Charges:  $Therapeutic Activity: 23-37 mins                     Wyona Almas, PT, DPT Acute Rehabilitation Services Pager [phone] Office [place] 08/23/2020, 5:29 PM

## 2020-08-23 NOTE — Progress Notes (Signed)
Pt stated "are you guys really giving my oxy?  I do not feel it, every time I get it I should fall asleep"   Informed Pt oxycodone was not a sleep aide, it was for pain relieve. Pt asked to see medication package and stated "I want to verified I am really getting the oxy" I showed her the package before administration.

## 2020-08-23 NOTE — Progress Notes (Signed)
  NEUROSURGERY PROGRESS NOTE   No issues overnight.  Complains of severe back pain, numbness BLE  EXAM:  BP 118/82 (BP Location: Left Arm)   Pulse 94   Temp 99.4 F (37.4 C) (Oral)   Resp 16   Ht 4\' 11"  (1.499 m)   Wt 84.8 kg   SpO2 94%   BMI 37.77 kg/m   Awake, alert, oriented  Speech fluent, appropriate  CN grossly intact  MAE, needs extensive encouragement. Inconsistent exam Inconsistent sensation  Incision: c/d/i  IMPRESSION/PLAN 50 y.o. female s/p L3-4 L4-5 decompression and fusion.  - urinary retention: I/O at bedside. Added flomax - therapy rec HH OT/PT, but may need SNF as she is not progressing well

## 2020-08-23 NOTE — Plan of Care (Signed)
  Problem: Safety: °Goal: Ability to remain free from injury will improve °Outcome: Progressing °  °Problem: Education: °Goal: Knowledge of the prescribed therapeutic regimen will improve °Outcome: Progressing °  °Problem: Activity: °Goal: Ability to avoid complications of mobility impairment will improve °Outcome: Progressing °  °

## 2020-08-24 NOTE — Progress Notes (Signed)
Physical Therapy Treatment Patient Details Name: Kathryn [name] MRN: [id number] DOB: 02-22-1971 Today's Date: 08/24/2020    History of Present Illness Pt is a 50 y.o. F who presents s/p L3-5 PLIF. Significant PMH: bipolar disorder, COPD, L patella-femoral arthroplasty.    PT Comments    Pt continues with similar presentation compared to yesterday, although seems more alert. Requiring two person moderate assist to stand up to a walker; able to weight shift, but unable to un-weight LE's to take steps due to knee buckle, necessitating sitting back down on edge of bed. Utilized Denna Haggard for subsequent transfers to [facility] and then to recliner for safety. Pt continues with pain, BLE weakness, sensation impairments, and poor balance. Continue to recommend comprehensive inpatient rehab (CIR) for post-acute therapy needs.    Follow Up Recommendations  CIR     Equipment Recommendations  Rolling walker with 5" wheels;Wheelchair (measurements PT);Wheelchair cushion (measurements PT);3in1 (PT) (youth RW)    Recommendations for Other Services       Precautions / Restrictions Precautions Precautions: Back;Fall Precaution Booklet Issued: Yes (comment) Required Braces or Orthoses: Spinal Brace Spinal Brace: Lumbar corset Restrictions Weight Bearing Restrictions: No    Mobility  Bed Mobility Overal bed mobility: Needs Assistance Bed Mobility: Sidelying to Sit;Rolling Rolling: Mod assist Sidelying to sit: Mod assist;HOB elevated       General bed mobility comments: Pt needed assistance with bringing legs off the bed and supporting the trunk to come to sitting. Use of bed pad to scoot hips forward    Transfers Overall transfer level: Needs assistance Equipment used: Rolling walker (2 wheeled);Ambulation equipment used Transfers: Sit to/from Stand Sit to Stand: Mod assist;+2 physical assistance         General transfer comment: ModA + 2 to stand up to walker, cues for hand/feet  placement. Pt able to weight shift, but with unweighting left foot, had knee buckle and ankle inversion and requesting to sit back down. utilized stedy to progress to [facility] and then recliner.  Ambulation/Gait                 Stairs             Wheelchair Mobility    Modified Rankin (Stroke Patients Only)       Balance Overall balance assessment: Needs assistance Sitting-balance support: Feet supported Sitting balance-Leahy Scale: Fair   Postural control: Posterior lean;Left lateral lean Standing balance support: Bilateral upper extremity supported Standing balance-Leahy Scale: Poor Standing balance comment: reliant on external support                            Cognition Arousal/Alertness: Awake/alert Behavior During Therapy: WFL for tasks assessed/performed Overall Cognitive Status: Impaired/Different from baseline Area of Impairment: Memory;Awareness;Safety/judgement                     Memory: Decreased short-term memory   Safety/Judgement: Decreased awareness of safety Awareness: Emergent   General Comments: Pt attributes new weakness/numbness to a medication a RN gave her      Exercises      General Comments General comments (skin integrity, edema, etc.): VSS on RA, Incision on back appears dry with no redness.      Pertinent Vitals/Pain Pain Assessment: Faces Faces Pain Scale: Hurts whole lot Pain Location: low back, BLE's Pain Descriptors / Indicators: Discomfort;Burning;Numbness;Spasm Pain Intervention(s): Limited activity within patient's tolerance;Monitored during session    Home Living  Prior Function            PT Goals (current goals can now be found in the care plan section) Acute Rehab PT Goals Patient Stated Goal: To be able to stand and walk again Potential to Achieve Goals: Good    Frequency    Min 5X/week      PT Plan Current plan remains appropriate     Co-evaluation PT/OT/SLP Co-Evaluation/Treatment: Yes Reason for Co-Treatment: For patient/therapist safety;To address functional/ADL transfers PT goals addressed during session: Mobility/safety with mobility OT goals addressed during session: ADL's and self-care      AM-PAC PT "6 Clicks" Mobility   Outcome Measure  Help needed turning from your back to your side while in a flat bed without using bedrails?: A Lot Help needed moving from lying on your back to sitting on the side of a flat bed without using bedrails?: A Lot Help needed moving to and from a bed to a chair (including a wheelchair)?: Total Help needed standing up from a chair using your arms (e.g., wheelchair or bedside chair)?: Total Help needed to walk in hospital room?: Total Help needed climbing 3-5 steps with a railing? : Total 6 Click Score: 8    End of Session Equipment Utilized During Treatment: Gait belt;Back brace Activity Tolerance: Patient tolerated treatment well Patient left: in chair;with call bell/phone within reach;with chair alarm set Nurse Communication: Mobility status PT Visit Diagnosis: Pain;Difficulty in walking, not elsewhere classified (R26.2) Pain - part of body:  (back)     Time: 4270-6237 PT Time Calculation (min) (ACUTE ONLY): 37 min  Charges:  $Therapeutic Activity: 8-22 mins                     Wyona Almas, PT, DPT [place] Pager [phone] Office [phone]    Deno Etienne 08/24/2020, 1:23 PM

## 2020-08-24 NOTE — Progress Notes (Signed)
Inpatient Rehab Admissions Coordinator Note:   Per PT updated recommendations, pt was screened for CIR candidacy by Shann Medal, PT, DPT.  Also note inconsistent with PA-C this morning.  Will follow for today's therapy session.  If she continues to require significant assist, would recommend CIR consult and I will place an order at that time.   Please contact me with questions.   Shann Medal, PT, DPT [phone] 08/24/20 9:19 AM

## 2020-08-24 NOTE — Progress Notes (Signed)
Occupational Therapy Treatment Patient Details Name: Kathryn [name] MRN: [id number] DOB: 07/25/1970 Today's Date: 08/24/2020    History of present illness Pt is a 50 y.o. F who presents s/p L3-5 PLIF. Significant PMH: bipolar disorder, COPD, L patella-femoral arthroplasty.   OT comments  Pt making progress towards OT goals this session after experiencing significant change in functioning due to pain and numbness in BLE. Pt was able to transfer to [facility] and Recliner this session with the use of a stedy and Mod-Max +2 assist. Once in the stedy and balance maintained pt was able to stand for approximately 45 seconds x2. Completed grooming and toileting with set up and assist. Pt will benefit from CIR to progress towards prior level of independence. Acute OT to continue to follow to assist with progressing towards OT goals.    Follow Up Recommendations  CIR    Equipment Recommendations  None recommended by OT    Recommendations for Other Services Rehab consult    Precautions / Restrictions Precautions Precautions: Back;Fall Required Braces or Orthoses: Spinal Brace Spinal Brace: Lumbar corset Restrictions Weight Bearing Restrictions: No       Mobility Bed Mobility Overal bed mobility: Needs Assistance Bed Mobility: Sidelying to Sit   Sidelying to sit: Mod assist;HOB elevated       General bed mobility comments: Pt needed assistance with bringing legs off the bed and supporting the trunk to come to sitting.    Transfers Overall transfer level: Needs assistance Equipment used: Rolling walker (2 wheeled);Ambulation equipment used Transfers: Sit to/from Stand Sit to Stand: Mod assist;+2 physical assistance;+2 safety/equipment         General transfer comment: Mod-Max A +2 to come to standing in stedy, once standing and balanced pt was able to remain standing for ~45 seconds.    Balance Overall balance assessment: Needs assistance Sitting-balance support: Feet  supported Sitting balance-Leahy Scale: Fair   Postural control: Posterior lean;Left lateral lean Standing balance support: Bilateral upper extremity supported Standing balance-Leahy Scale: Poor Standing balance comment: reliant on external support                           ADL either performed or assessed with clinical judgement   ADL Overall ADL's : Needs assistance/impaired     Grooming: Wash/dry hands;Wash/dry face;Applying deodorant;Brushing hair;Set up;Sitting Grooming Details (indicate cue type and reason): completed sitting on [facility]                 Toilet Transfer: Maximal assistance;BSC Toilet Transfer Details (indicate cue type and reason): Used the [facility] for transfer from bed to toilet. Toileting- Clothing Manipulation and Hygiene: Maximal assistance;Sitting/lateral lean;Sit to/from stand Toileting - Clothing Manipulation Details (indicate cue type and reason): Pt unable to reach behind herself while standing to complete pericare     Functional mobility during ADLs: Maximal assistance;+2 for physical assistance;+2 for safety/equipment General ADL Comments: Pt very limited this session, when attempting to stand with RW, LLE buckled and ankle inverted in. With the [facility] pt was able to stand and support herself in standing for transfer, requiring mod-max assist +2 to power up to standing. Pt completed toileting and grooming this session.     Vision       Perception     Praxis      Cognition Arousal/Alertness: Awake/alert Behavior During Therapy: WFL for tasks assessed/performed Overall Cognitive Status: Impaired/Different from baseline Area of Impairment: Memory;Awareness  Memory: Decreased short-term memory     Awareness: Emergent   General Comments: Pt attributes new weakness/numbness to a medication a RN gave her        Exercises     Shoulder Instructions       General Comments VSS on RA, Incision on back  appears dry with no redness.    Pertinent Vitals/ Pain       Pain Assessment: Faces Faces Pain Scale: Hurts whole lot Pain Location: low back, BLE's Pain Descriptors / Indicators: Discomfort;Burning;Numbness;Spasm Pain Intervention(s): Limited activity within patient's tolerance;Monitored during session;Repositioned  Home Living                                          Prior Functioning/Environment              Frequency  Min 2X/week        Progress Toward Goals  OT Goals(current goals can now be found in the care plan section)  Progress towards OT goals: Progressing toward goals  Acute Rehab OT Goals Patient Stated Goal: To be able to stand and walk again OT Goal Formulation: With patient Time For Goal Achievement: 09/04/20 Potential to Achieve Goals: Good ADL Goals Pt Will Perform Grooming: with modified independence;sitting;standing Pt Will Perform Lower Body Bathing: with modified independence;sit to/from stand Pt Will Perform Lower Body Dressing: with modified independence;sit to/from stand;with adaptive equipment Pt Will Transfer to Toilet: with modified independence;ambulating;regular height toilet Pt Will Perform Toileting - Clothing Manipulation and hygiene: with modified independence;sit to/from stand  Plan Discharge plan remains appropriate;Frequency remains appropriate    Co-evaluation    PT/OT/SLP Co-Evaluation/Treatment: Yes Reason for Co-Treatment: For patient/therapist safety;To address functional/ADL transfers PT goals addressed during session: Mobility/safety with mobility;Balance OT goals addressed during session: ADL's and self-care      AM-PAC OT "6 Clicks" Daily Activity     Outcome Measure   Help from another person eating meals?: A Little Help from another person taking care of personal grooming?: A Little Help from another person toileting, which includes using toliet, bedpan, or urinal?: Total Help from another  person bathing (including washing, rinsing, drying)?: Total Help from another person to put on and taking off regular upper body clothing?: A Little Help from another person to put on and taking off regular lower body clothing?: Total 6 Click Score: 12    End of Session Equipment Utilized During Treatment: Back brace;Gait belt;Rolling walker;Other (comment) Charlaine Dalton)  OT Visit Diagnosis: Unsteadiness on feet (R26.81);Pain Pain - Right/Left:  (Both sides) Pain - part of body: Leg (and back)   Activity Tolerance Other (comment) (Pt limited by numbness and pain)   Patient Left in chair;with call bell/phone within reach;with chair alarm set   Nurse Communication Mobility status;Need for lift equipment        Time: 1018-1056 OT Time Calculation (min): 38 min  Charges: OT General Charges $OT Visit: 1 Visit OT Treatments $Self Care/Home Management : 23-37 mins  Azzie Thiem H., OTR/L [place] 08/24/2020, 1:02 PM

## 2020-08-24 NOTE — Progress Notes (Signed)
Inpatient Rehab Admissions Coordinator:   At this time we are recommending a CIR consult and I will place an order per our protocol.    Shann Medal, PT, DPT Admissions Coordinator [phone] 08/24/20  2:29 PM

## 2020-08-24 NOTE — Progress Notes (Addendum)
Noted a white round pill more likely Oxycodone 5 mg under patient's cup of water on table. Patient stated that she didn't take it because it gives her a burning sensation to her BLEs. Wasted pill in steri cycle bin.

## 2020-08-24 NOTE — Progress Notes (Signed)
  NEUROSURGERY PROGRESS NOTE   Events regarding medication reviewed. Discussed with patient. I/O x2 yesterday. Has not voided yet today. Complains of moderate back soreness N/T/W in BLE "improving"  EXAM:  BP 133/67 (BP Location: Left Arm)   Pulse 94   Temp 98.7 F (37.1 C)   Resp 17   Ht 4\' 11"  (1.499 m)   Wt 84.8 kg   SpO2 99%   BMI 37.77 kg/m   Awake, alert, oriented  Speech fluent, appropriate  CN grossly intact  Inconsistent exam: patient states she can barely move BLE at one point, but then flexes her RLE at knee to get up off bed and reposition self. She is able to move BLE at least anti-gravity, but can't move her toes?. She has sensation in BLE to LT, seemingly equal on both sides. Incision: c/d/i  IMPRESSION/PLAN 50 y.o. female S/p L3-4 L4-5 decompression and fusion. Inconsistent exam. - urinary retention: I/O x2. Foley if unable to void. Continue flomax - CIR vs. SNF

## 2020-08-24 NOTE — Plan of Care (Signed)
Pt resting on bed, no distressed noted or reported by Pt.   Problem: Safety: Goal: Ability to remain free from injury will improve Outcome: Progressing   Problem: Education: Goal: Knowledge of the prescribed therapeutic regimen will improve Outcome: Progressing   Problem: Activity: Goal: Ability to avoid complications of mobility impairment will improve Outcome: Progressing

## 2020-08-25 NOTE — Progress Notes (Signed)
Physical Therapy Treatment Patient Details Name: Kathryn [name] MRN: [id number] DOB: 10/20/1970 Today's Date: 08/25/2020    History of Present Illness Pt is a 50 y.o. F who presents s/p L3-5 PLIF. Significant PMH: bipolar disorder, COPD, L patella-femoral arthroplasty.    PT Comments    Pt able to make some progress towards goals today, but is still limited by weakness and numbness in LEs. Pt progressed OOB via stedy. Once in recliner pt performed sit<>stand 2x and was able to march in place with mod A. May be able to attempt short distance gait with chair close to follow next session.     Follow Up Recommendations  CIR     Equipment Recommendations  Rolling walker with 5" wheels;Wheelchair (measurements PT);Wheelchair cushion (measurements PT);3in1 (PT) (youth RW)    Recommendations for Other Services       Precautions / Restrictions Precautions Precautions: Back;Fall Precaution Booklet Issued: Yes (comment) Precaution Comments: Verbally reviewed Required Braces or Orthoses: Spinal Brace Spinal Brace: Lumbar corset Restrictions Weight Bearing Restrictions: No    Mobility  Bed Mobility Overal bed mobility: Needs Assistance Bed Mobility: Sidelying to Sit;Rolling Rolling: Supervision Sidelying to sit: Mod assist       General bed mobility comments: Pt able to log roll w/o assist. min A to progress LE off EOB and mod A to elevate trunk.    Transfers Overall transfer level: Needs assistance Equipment used: Rolling walker (2 wheeled);Ambulation equipment used Transfers: Sit to/from Stand Sit to Stand: Min assist         General transfer comment: min A to rise into standing with stedy. Once seated in recliner chair, has pt perform sit<>stand 2x with RW.  Ambulation/Gait Ambulation/Gait assistance: Mod assist   Assistive device: Rolling walker (2 wheeled)   Gait velocity: decreased Gait velocity interpretation: <1.31 ft/sec, indicative of household  ambulator General Gait Details: marched in place with RW. Assist at L quad to prevend LE buckling. Pt LEs very weak and spasming during marching attempt. Able to tolerate for ~15 seconds before needing to sit.   Stairs             Wheelchair Mobility    Modified Rankin (Stroke Patients Only)       Balance Overall balance assessment: Needs assistance Sitting-balance support: Feet supported Sitting balance-Leahy Scale: Fair     Standing balance support: Bilateral upper extremity supported Standing balance-Leahy Scale: Poor Standing balance comment: reliant on external support                            Cognition Arousal/Alertness: Awake/alert Behavior During Therapy: WFL for tasks assessed/performed Overall Cognitive Status: Within Functional Limits for tasks assessed                                 General Comments: Able to demonstrate recall and adhearance to back precautions throughout session.      Exercises      General Comments        Pertinent Vitals/Pain Pain Assessment: Faces Faces Pain Scale: Hurts even more Pain Location: low back, BLE's Pain Descriptors / Indicators: Discomfort;Burning;Numbness;Spasm Pain Intervention(s): Monitored during session;Limited activity within patient's tolerance;Repositioned;Premedicated before session    Home Living                      Prior Function  PT Goals (current goals can now be found in the care plan section) Acute Rehab PT Goals Patient Stated Goal: To be able to stand and walk again PT Goal Formulation: With patient Time For Goal Achievement: 09/04/20 Potential to Achieve Goals: Good Progress towards PT goals: Progressing toward goals    Frequency    Min 5X/week      PT Plan Current plan remains appropriate    Co-evaluation              AM-PAC PT "6 Clicks" Mobility   Outcome Measure  Help needed turning from your back to your side while  in a flat bed without using bedrails?: None Help needed moving from lying on your back to sitting on the side of a flat bed without using bedrails?: A Lot Help needed moving to and from a bed to a chair (including a wheelchair)?: A Lot Help needed standing up from a chair using your arms (e.g., wheelchair or bedside chair)?: A Lot Help needed to walk in hospital room?: Total Help needed climbing 3-5 steps with a railing? : Total 6 Click Score: 12    End of Session Equipment Utilized During Treatment: Back brace Activity Tolerance: Patient tolerated treatment well Patient left: in chair;with call bell/phone within reach Nurse Communication: Mobility status PT Visit Diagnosis: Pain;Difficulty in walking, not elsewhere classified (R26.2) Pain - part of body:  (back)     Time: 6599-3570 PT Time Calculation (min) (ACUTE ONLY): 24 min  Charges:  $Therapeutic Activity: 23-37 mins                     Benjiman Core, [place] Pager [phone] Acute Rehab   Allena Katz 08/25/2020, 3:34 PM

## 2020-08-25 NOTE — Progress Notes (Signed)
  NEUROSURGERY PROGRESS NOTE   No issues overnight.  Continues to note improvement in BLE   EXAM:  BP 110/71 (BP Location: Right Arm)   Pulse 75   Temp 98.6 F (37 C) (Oral)   Resp 18   Ht 4\' 11"  (1.499 m)   Wt 84.8 kg   SpO2 100%   BMI 37.77 kg/m   Awake, alert, oriented  Speech fluent, appropriate  CN grossly intact  Inconsistent exam again today. Does MAEW, at least antigravity BLE Incision: c/d/i  IMPRESSION/PLAN 50 y.o. female S/p L3-4 L4-5 decompression and fusion.Inconsistent exam again, although better compared to yesterday. - urinary retention: foley placed. Continue bladder rest, flomax - CIR candidate

## 2020-08-25 NOTE — Progress Notes (Signed)
Inpatient Rehab Admissions Coordinator:   SPoke to pt on the phone to discuss goals/expectations of CIR stay.  She reports that she is open to CIR and we discussed ELOS of 2 weeks with goals of supervision to mod I.  She has a partner at home and her daughter can come assist some as needed.  I will start insurance authorization for her today.   Shann Medal, PT, DPT Admissions Coordinator [phone] 08/25/20  12:53 PM

## 2020-08-26 NOTE — Progress Notes (Signed)
Inpatient Rehab Admissions Coordinator:   Received notification from [facility] that request for CIR has been denied due to lack of medical necessity.  Pt will need to seek f/u therapy at a lower level of care (home health versus SNF, will defer to therapy).  CIR will sign off at this time.   Shann Medal, PT, DPT Admissions Coordinator [phone] 08/26/20  4:30 PM

## 2020-08-26 NOTE — Progress Notes (Signed)
Inpatient Rehab Admissions Coordinator:   Awaiting determination from insurance regarding prior auth request for CIR.  Will continue to follow.   Shann Medal, PT, DPT Admissions Coordinator [phone] 08/26/20  1:54 PM

## 2020-08-26 NOTE — Progress Notes (Signed)
Physical Therapy Treatment Patient Details Name: Kathryn [name] MRN: [id number] DOB: March 14, 1971 Today's Date: 08/26/2020    History of Present Illness Pt is a 50 y.o. F who presents s/p L3-5 PLIF. Significant PMH: bipolar disorder, COPD, L patella-femoral arthroplasty.    PT Comments    Pt side lying bed on arrival.  Increased assistance to movement into sitting and progress gt.  LLE weakness continues to limit progress, plan for aggressive rehab in a post acute setting.     Follow Up Recommendations  CIR     Equipment Recommendations  Rolling walker with 5" wheels;Wheelchair (measurements PT);Wheelchair cushion (measurements PT);3in1 (PT) (youth)    Recommendations for Other Services       Precautions / Restrictions Precautions Precautions: Back;Fall Precaution Booklet Issued: Yes (comment) Precaution Comments: Verbally reviewed Required Braces or Orthoses: Spinal Brace Spinal Brace: Lumbar corset Restrictions Weight Bearing Restrictions: No    Mobility  Bed Mobility Overal bed mobility: Needs Assistance Bed Mobility: Rolling;Sidelying to Sit Rolling: Min assist Sidelying to sit: Mod assist       General bed mobility comments: Increased time and effort to rise into sitting with cues for maintaining back precautions.  Pt required assistance to scoot forward to edge of bed.    Transfers Overall transfer level: Needs assistance Equipment used: Rolling walker (2 wheeled) Transfers: Sit to/from Stand Sit to Stand: Min assist         General transfer comment: Cues for hand placement and elevated seat height to rise into standing.  Ambulation/Gait Ambulation/Gait assistance: Mod assist;Max assist Gait Distance (Feet): 6 Feet Assistive device: Rolling walker (2 wheeled) Gait Pattern/deviations: Step-through pattern;Decreased stride length;Trendelenburg Gait velocity: decreased   General Gait Details: L ataxia noted and buckling to follow.  Unable to progress gt  further.  Pt required close chair follow and need to sit urgently.   Stairs             Wheelchair Mobility    Modified Rankin (Stroke Patients Only)       Balance Overall balance assessment: Needs assistance Sitting-balance support: Feet supported Sitting balance-Leahy Scale: Fair       Standing balance-Leahy Scale: Poor                              Cognition Arousal/Alertness: Awake/alert Behavior During Therapy: WFL for tasks assessed/performed Overall Cognitive Status: Impaired/Different from baseline Area of Impairment: Memory;Awareness;Safety/judgement                     Memory: Decreased recall of precautions   Safety/Judgement: Decreased awareness of safety;Decreased awareness of deficits     General Comments: Able to demonstrate recall and adhearance to back precautions throughout session.      Exercises      General Comments        Pertinent Vitals/Pain Pain Assessment: 0-10 Pain Score: 7  Pain Location: low back, BLE's Pain Descriptors / Indicators: Aching;Cramping;Crying;Discomfort;Grimacing;Guarding;Stabbing Pain Intervention(s): Monitored during session;Repositioned    Home Living                      Prior Function            PT Goals (current goals can now be found in the care plan section) Acute Rehab PT Goals Patient Stated Goal: To be able to stand and walk again Potential to Achieve Goals: Good Progress towards PT goals: Progressing toward goals    Frequency  Min 5X/week      PT Plan Current plan remains appropriate    Co-evaluation              AM-PAC PT "6 Clicks" Mobility   Outcome Measure  Help needed turning from your back to your side while in a flat bed without using bedrails?: A Lot Help needed moving from lying on your back to sitting on the side of a flat bed without using bedrails?: A Lot Help needed moving to and from a bed to a chair (including a wheelchair)?: A  Little Help needed standing up from a chair using your arms (e.g., wheelchair or bedside chair)?: A Little Help needed to walk in hospital room?: A Lot Help needed climbing 3-5 steps with a railing? : A Lot 6 Click Score: 14    End of Session Equipment Utilized During Treatment: Gait belt Activity Tolerance: Patient tolerated treatment well Patient left: in chair;with call bell/phone within reach;with chair alarm set Nurse Communication: Mobility status PT Visit Diagnosis: Pain;Difficulty in walking, not elsewhere classified (R26.2) Pain - part of body:  (back)     Time: 9381-8299 PT Time Calculation (min) (ACUTE ONLY): 22 min  Charges:  $Gait Training: 8-22 mins                     Erasmo Leventhal , PTA Acute Rehabilitation Services Pager [phone] Office [phone]     [name] 08/26/2020, 4:33 PM

## 2020-08-26 NOTE — Progress Notes (Signed)
Occupational Therapy Treatment Patient Details Name: Kathryn [name] MRN: [id number] DOB: 12/24/1970 Today's Date: 08/26/2020    History of present illness Pt is a 50 y.o. F who presents s/p L3-5 PLIF. Significant PMH: bipolar disorder, COPD, L patella-femoral arthroplasty.   OT comments  Pt was limited by pain this session, requesting to stay in bed for therapy this session. OT assisted pt in working on bed mobility, log rolling, scooting in bed, and repositioning to relieve pain and pressure. OT educated pt on the importance of mobility and repositioning to ease pain. Pt agreeable to all bed mobility and education. OT to continue following up to assist with OOB tasks.    Follow Up Recommendations  CIR    Equipment Recommendations  None recommended by OT    Recommendations for Other Services      Precautions / Restrictions Precautions Precautions: Back;Fall Precaution Booklet Issued: Yes (comment) Precaution Comments: Verbally reviewed Required Braces or Orthoses: Spinal Brace Spinal Brace: Lumbar corset       Mobility Bed Mobility Overal bed mobility: Needs Assistance Bed Mobility: Rolling Rolling: Min assist         General bed mobility comments: Pt focusing on log rolling this session for pain and pressure relief with repositioning in bed. Pt required min assist due to pain levels and bringing legs to either side with trunk.    Transfers                 General transfer comment: Pt deferred standing or sitting EOB this session    Balance                                           ADL either performed or assessed with clinical judgement   ADL   Eating/Feeding: Set up;Bed level Eating/Feeding Details (indicate cue type and reason): Pt required OT to place cup in her hand so she could drink, due to increased pain Grooming: Wash/dry hands;Wash/dry face;Bed level Grooming Details (indicate cue type and reason): Completed bed level this  session due to increased pain and pt requesting to stay in bed.                               General ADL Comments: Pt limited to bed mobility and bed level ADL's this session due to increased pain levels in her back.     Vision       Perception     Praxis      Cognition Arousal/Alertness: Awake/alert Behavior During Therapy: WFL for tasks assessed/performed Overall Cognitive Status: Within Functional Limits for tasks assessed                                          Exercises     Shoulder Instructions       General Comments VSS on RA, Increased pain, maintaining at 10 pain level.    Pertinent Vitals/ Pain       Pain Assessment: 0-10 Pain Score: 10-Worst pain ever Pain Location: low back, BLE's Pain Descriptors / Indicators: Aching;Cramping;Crying;Discomfort;Grimacing;Guarding;Stabbing Pain Intervention(s): Limited activity within patient's tolerance;Monitored during session;Repositioned  Home Living  Prior Functioning/Environment              Frequency  Min 2X/week        Progress Toward Goals  OT Goals(current goals can now be found in the care plan section)  Progress towards OT goals: Not progressing toward goals - comment (Increased pain level limiting performance.)  Acute Rehab OT Goals Patient Stated Goal: To be able to stand and walk again OT Goal Formulation: With patient Time For Goal Achievement: 09/04/20 Potential to Achieve Goals: Good ADL Goals Pt Will Perform Grooming: with modified independence;sitting;standing Pt Will Perform Lower Body Bathing: with modified independence;sit to/from stand Pt Will Perform Lower Body Dressing: with modified independence;sit to/from stand;with adaptive equipment Pt Will Transfer to Toilet: with modified independence;ambulating;regular height toilet Pt Will Perform Toileting - Clothing Manipulation and hygiene: with  modified independence;sit to/from stand  Plan Discharge plan remains appropriate;Frequency remains appropriate    Co-evaluation                 AM-PAC OT "6 Clicks" Daily Activity     Outcome Measure   Help from another person eating meals?: A Little Help from another person taking care of personal grooming?: A Little Help from another person toileting, which includes using toliet, bedpan, or urinal?: Total Help from another person bathing (including washing, rinsing, drying)?: Total Help from another person to put on and taking off regular upper body clothing?: A Little Help from another person to put on and taking off regular lower body clothing?: Total 6 Click Score: 12    End of Session    OT Visit Diagnosis: Unsteadiness on feet (R26.81);Pain   Activity Tolerance Patient limited by pain   Patient Left in bed;with call bell/phone within reach   Nurse Communication Mobility status;Patient requests pain meds        Time: 1045-1120 OT Time Calculation (min): 35 min  Charges: OT General Charges $OT Visit: 1 Visit OT Treatments $Therapeutic Activity: 23-37 mins  Asah Lamay H., OTR/L Acute Rehabilitation  Gerldine Suleiman [name] 08/26/2020, 1:31 PM

## 2020-08-26 NOTE — Progress Notes (Signed)
Subjective: Patient reports numbness in her legs and feet, and some improving weakness. I've read her daily progress notes, and Im rounding for Dr Kathyrn Sheriff who is Out of town  Objective: Vital signs in last 24 hours: Temp:  [97.8 F (36.6 C)-99.4 F (37.4 C)] 97.8 F (36.6 C) (05/12 0457) Pulse Rate:  [74-89] 74 (05/12 0457) Resp:  [17-18] 17 (05/12 0457) BP: (91-107)/(55-65) 91/65 (05/12 0457) SpO2:  [92 %-99 %] 92 % (05/12 0457)  Intake/Output from previous day: 05/11 0701 - 05/12 0700 In: 240 [P.O.:240] Out: 1900 [Urine:1900] Intake/Output this shift: No intake/output data recorded.  She has gross sensation in her feet, and she moves her legs in bed maybe 3/5, maybe better, dressing is dry and flat  Lab Results: Lab Results  Component Value Date   WBC 17.1 (H) 08/21/2020   HGB 10.9 (L) 08/21/2020   HCT 34.1 (L) 08/21/2020   MCV 89.3 08/21/2020   PLT 533 (H) 08/21/2020   Lab Results  Component Value Date   INR 1.1 08/21/2020   BMET Lab Results  Component Value Date   NA 133 (L) 08/23/2020   K 4.0 08/23/2020   CL 98 08/23/2020   CO2 26 08/23/2020   GLUCOSE 112 (H) 08/23/2020   BUN 7 08/23/2020   CREATININE 0.89 08/23/2020   CALCIUM 8.8 (L) 08/23/2020    Studies/Results: No results found.  Assessment/Plan: She seems to be improving. We considered MRI or CT this am, but imaging always looks worse than it is this early after surgery as there are blood products that almost always APPEAR compressive this soon after surgery even in those who are asymptomatic in my experience over the years, so I'm not sure an MRI will change our treatment plan with POT/OT and CIR.  Estimated body mass index is 37.77 kg/m as calculated from the following:   Height as of this encounter: 4\' 11"  (1.499 m).   Weight as of this encounter: 84.8 kg.    LOS: 6 days    Kathryn [name] 08/26/2020, 8:17 AM

## 2020-08-27 MED ORDER — CHLORHEXIDINE GLUCONATE CLOTH 2 % EX PADS
6.0000 | MEDICATED_PAD | Freq: Every day | CUTANEOUS | Status: DC
  Administered 2020-08-27 – 2020-09-01 (×5): 6 via TOPICAL

## 2020-08-27 NOTE — Progress Notes (Signed)
Physical Therapy Treatment Patient Details Name: Kathryn [name] MRN: [id number] DOB: [birth year] Today's Date: 08/27/2020    History of Present Illness Pt is a 50 y.o. F who presents s/p L3-5 PLIF. During admission, developed numbness in BLE. Significant PMH: bipolar disorder, COPD, L patella-femoral arthroplasty.    PT Comments    Pt progressing towards goals. Wanted to stay in bed this session as she had already been up. Performed supine HEP this session. Required very minimal assist for LE movement. Per notes, pt's had denial for CIR. Updated recommendations for SNF at d/c. Current recommendations appropriate. Will continue to follow acutely.     Follow Up Recommendations  SNF (CIR denied pt)     Equipment Recommendations  Wheelchair (measurements PT);Wheelchair cushion (measurements PT);Rolling walker with 5" wheels;3in1 (PT) (youth)    Recommendations for Other Services       Precautions / Restrictions Precautions Precautions: Back;Fall Restrictions Weight Bearing Restrictions: No    Mobility  Bed Mobility Overal bed mobility: Needs Assistance Bed Mobility: Rolling Rolling: Min assist         General bed mobility comments: Min A for rolling for repositioning at end of session. Did not want to get OOB as she had been OOB already today.    Transfers                    Ambulation/Gait                 Stairs             Wheelchair Mobility    Modified Rankin (Stroke Patients Only)       Balance                                            Cognition Arousal/Alertness: Awake/alert Behavior During Therapy: WFL for tasks assessed/performed Overall Cognitive Status: No family/caregiver present to determine baseline cognitive functioning                                        Exercises General Exercises - Lower Extremity Ankle Circles/Pumps: AAROM;Both;10 reps;Supine Quad Sets: AROM;Both;10  reps;Supine Gluteal Sets: AROM;Both;10 reps;Supine Heel Slides: AAROM;Both;10 reps;Supine    General Comments        Pertinent Vitals/Pain Pain Assessment: Faces Faces Pain Scale: Hurts even more Pain Location: low back, BLE's Pain Descriptors / Indicators: Grimacing;Guarding;Cramping Pain Intervention(s): Limited activity within patient's tolerance;Monitored during session;Repositioned    Home Living                      Prior Function            PT Goals (current goals can now be found in the care plan section) Acute Rehab PT Goals Patient Stated Goal: To be indepedent PT Goal Formulation: With patient Time For Goal Achievement: 09/04/20 Potential to Achieve Goals: Good Progress towards PT goals: Progressing toward goals    Frequency    Min 5X/week      PT Plan Discharge plan needs to be updated    Co-evaluation              AM-PAC PT "6 Clicks" Mobility   Outcome Measure  Help needed turning from your back to your side while in a flat bed without  using bedrails?: A Little Help needed moving from lying on your back to sitting on the side of a flat bed without using bedrails?: A Lot Help needed moving to and from a bed to a chair (including a wheelchair)?: A Lot Help needed standing up from a chair using your arms (e.g., wheelchair or bedside chair)?: A Lot Help needed to walk in hospital room?: A Lot Help needed climbing 3-5 steps with a railing? : A Lot 6 Click Score: 13    End of Session Equipment Utilized During Treatment: Gait belt Activity Tolerance: Patient tolerated treatment well Patient left: in bed;with call bell/phone within reach Nurse Communication: Mobility status PT Visit Diagnosis: Pain;Difficulty in walking, not elsewhere classified (R26.2) Pain - part of body:  (back)     Time: 1749-4496 PT Time Calculation (min) (ACUTE ONLY): 21 min  Charges:  $Therapeutic Exercise: 8-22 mins                     Lou Miner,  DPT  Acute Rehabilitation Services  Pager: [phone] Office: [phone]    Rudean Hitt 08/27/2020, 2:50 PM

## 2020-08-27 NOTE — TOC Initial Note (Signed)
Transition of Care [facility]) - Initial/Assessment Note    Patient Details  Name: Kathryn [name] MRN: [id number] Date of Birth: 19-Dec-1970  Transition of Care [facility]) CM/SW Contact:    Bethann Berks[place], [place] Phone Number: 08/27/2020, 4:31 PM  Clinical Narrative:                  CSW met with pt for SNF consult. Pt lives in [place] with her Significant other Kathryn [name]. She also has a daughter, Kathryn [name], who lives near by. Pt receives SSDI income. Pt is agreeable to SNF workup at this time. CSW completed fl2 and faxed bed requests in the hub.   Expected Discharge Plan: Skilled Nursing Facility Barriers to Discharge: Continued Medical Work up   Patient Goals and CMS Choice Patient states their goals for this hospitalization and ongoing recovery are:: SNF for rehab   Choice offered to / list presented to : Patient  Expected Discharge Plan and Services Expected Discharge Plan: [place]       Living arrangements for the past 2 months: Apartment                                      Prior Living Arrangements/Services Living arrangements for the past 2 months: Apartment Lives with:: Significant Other Patient language and need for interpreter reviewed:: Yes Do you feel safe going back to the place where you live?: Yes      Need for Family Participation in Patient Care: No (Comment) Care giver support system in place?: No (comment)   Criminal Activity/Legal Involvement Pertinent to Current Situation/Hospitalization: No - Comment as needed  Activities of Daily Living Home Assistive Devices/Equipment: Walker (specify type),Cane (specify quad or straight) ADL Screening (condition at time of admission) Patient's cognitive ability adequate to safely complete daily activities?: Yes Is the patient deaf or have difficulty hearing?: No Does the patient have difficulty seeing, even when wearing glasses/contacts?: No Does the patient have difficulty concentrating,  remembering, or making decisions?: No Patient able to express need for assistance with ADLs?: Yes Does the patient have difficulty dressing or bathing?: Yes Independently performs ADLs?: No Communication: Independent Dressing (OT): Needs assistance Is this a change from baseline?: Pre-admission baseline Grooming: Independent Feeding: Independent Bathing: Needs assistance Is this a change from baseline?: Pre-admission baseline Toileting: Needs assistance Is this a change from baseline?: Pre-admission baseline In/Out Bed: Needs assistance Is this a change from baseline?: Pre-admission baseline Walks in Home: Independent Does the patient have difficulty walking or climbing stairs?: Yes Weakness of Legs: Both Weakness of Arms/Hands: Both  Permission Sought/Granted   Permission granted to share information with : Yes, Verbal Permission Granted  Share Information with NAME: Payton Barnette (SO) and  Barrie Folk Daughter           Emotional Assessment Appearance:: Appears stated age Attitude/Demeanor/Rapport: Lethargic Affect (typically observed): Accepting Orientation: : Oriented to Self,Oriented to Place,Oriented to  Time,Oriented to Situation Alcohol / Substance Use: Not Applicable Psych Involvement: No (comment)  Admission diagnosis:  Lumbar spinal stenosis [M48.061] Patient Active Problem List   Diagnosis Date Noted  . Lumbar spinal stenosis 08/20/2020  . Insomnia 03/19/2020  . Mild persistent asthma without complication 93/71/6967  . Need for immunization against influenza 03/19/2020  . Obesity (BMI 30-39.9) 03/19/2020  . GERD (gastroesophageal reflux disease) 03/19/2020  . Intermittent palpitations 12/17/2019  . Snoring 12/17/2019  . Excessive daytime sleepiness 12/17/2019  .  Choking 12/17/2019  . 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 Parasomnia 12/17/2019  . Enlarged uterus 02/20/2018  . Screening for colorectal cancer 02/20/2018  . Routine  cervical smear 02/20/2018  . Encounter to establish care 02/20/2018  . Osteoarthritis of left knee, 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  . MDD (major depressive disorder), recurrent episode, severe ([place]) 12/24/2017  . Lumbar back pain 05/11/2017  . Low vitamin B12 level 05/11/2017  . Primary hypertension 01/11/2017   PCP:  Lindell Spar, MD Pharmacy:   [facility] [place] [address] Phone: [phone] Fax: [phone]     Social Determinants of Health (SDOH) Interventions    Readmission Risk Interventions No flowsheet data found.

## 2020-08-27 NOTE — Progress Notes (Signed)
NEUROSURGERY PROGRESS NOTE  Postop day 7 two level PLIF. Doing well. Complains of appropriate back soreness. Incision CDI.  Complaining of staff being "too rough" with her but states she was able to turn herself over in bed yesterday. No change in movement of her lower extremities.   Temp:  [98.2 F (36.8 C)-98.9 F (37.2 C)] 98.2 F (36.8 C) (05/13 0550) Pulse Rate:  [77-85] 77 (05/13 0550) Resp:  [17-18] 17 (05/13 0550) BP: (81-89)/(54-59) 89/59 (05/13 0550) SpO2:  [97 %-100 %] 98 % (05/13 0550)   Kathryn Chiquito, NP 08/27/2020 9:06 AM

## 2020-08-27 NOTE — NC FL2 (Signed)
[place] LEVEL OF CARE SCREENING TOOL     IDENTIFICATION  Patient Name: Kathryn [name] Birthdate: 1970-08-09 Sex: female Admission Date (Current Location): 08/20/2020  [facility] [place] Number:  Herbalist and Address:  [facility], [place] [address], [place], [place] [address]      Provider Number: [id number]  Attending Physician Name and Address:  Consuella Lose, MD  Relative Name and Phone Number:  Philomena Doheny (Significant other)   [phone] [facility] Phone)    Current Level of Care: Hospital Recommended Level of Care: [place] Prior Approval Number:    Date Approved/Denied:   PASRR Number: Pending  Discharge Plan: SNF    Current Diagnoses: Patient Active Problem List   Diagnosis Date Noted  . Lumbar spinal stenosis 08/20/2020  . Insomnia 03/19/2020  . Mild persistent asthma without complication 93/79/0240  . Need for immunization against influenza 03/19/2020  . Obesity (BMI 30-39.9) 03/19/2020  . GERD (gastroesophageal reflux disease) 03/19/2020  . Intermittent palpitations 12/17/2019  . Snoring 12/17/2019  . Excessive daytime sleepiness 12/17/2019  . Choking 12/17/2019  . 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 Parasomnia 12/17/2019  . Enlarged uterus 02/20/2018  . Screening for colorectal cancer 02/20/2018  . Routine cervical smear 02/20/2018  . Encounter to establish care 02/20/2018  . Osteoarthritis of left knee, 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  . MDD (major depressive disorder), recurrent episode, severe ([place]) 12/24/2017  . Lumbar back pain 05/11/2017  . Low vitamin B12 level 05/11/2017  . Primary hypertension 01/11/2017    Orientation RESPIRATION BLADDER Height & Weight     Self,Time,Situation,Place  Normal Indwelling catheter Weight: 187 lb (84.8 kg) Height:  4\' 11"  (149.9 cm)  BEHAVIORAL SYMPTOMS/MOOD NEUROLOGICAL BOWEL NUTRITION STATUS      Incontinent Diet (see d/c  summary)  AMBULATORY STATUS COMMUNICATION OF NEEDS Skin   Extensive Assist Verbally Surgical wounds (Incision back)                       Personal Care Assistance Level of Assistance  Bathing,Feeding,Dressing Bathing Assistance: Limited assistance Feeding assistance: Independent Dressing Assistance: Limited assistance     Functional Limitations Info  Sight,Hearing,Speech Sight Info: Adequate Hearing Info: Adequate Speech Info: Adequate    SPECIAL CARE FACTORS FREQUENCY  PT (By licensed PT),OT (By licensed OT)     PT Frequency: 5x/week OT Frequency: 5x/week            Contractures Contractures Info: Not present    Additional Factors Info  Code Status,Allergies Code Status Info: Full code Allergies Info: Clonopin (clonazepam), fish allergy, flexeril, Shellfish Allergy, Ibuprofen, Cyclobenzaprine, tylenol, ace inhibitors, tramadol, Trazodone And Nefazodone           Current Medications (08/27/2020):  This is the current hospital active medication list Current Facility-Administered Medications  Medication Dose Route Frequency Provider Last Rate Last Admin  . 0.9 %  sodium chloride infusion   Intravenous Continuous Costella, Vista Mink, PA-C      . alum & mag hydroxide-simeth (MAALOX/MYLANTA) 200-200-20 MG/5ML suspension 30 mL  30 mL Oral Q4H PRN Consuella Lose, MD   30 mL at 08/21/20 1155  . amLODipine (NORVASC) tablet 5 mg  5 mg Oral Daily Costella, Vincent J, PA-C   5 mg at 08/27/20 0932  . bisacodyl (DULCOLAX) suppository 10 mg  10 mg Rectal Daily PRN Traci Sermon, PA-C   10 mg at 08/26/20 2025  . Chlorhexidine Gluconate Cloth 2 % PADS  6 each  6 each Topical Daily Consuella Lose, MD   6 each at 08/27/20 0935  . citalopram (CELEXA) tablet 20 mg  20 mg Oral Daily Costella, Vista Mink, PA-C   20 mg at 08/27/20 0932  . cloNIDine (CATAPRES) tablet 0.1 mg  0.1 mg Oral Daily Costella, Vincent J, PA-C   0.1 mg at 08/27/20 0933  . docusate sodium (COLACE)  capsule 100 mg  100 mg Oral BID Traci Sermon, PA-C   100 mg at 08/27/20 0931  . hydrOXYzine (ATARAX/VISTARIL) tablet 50 mg  50 mg Oral QHS Costella, Vincent J, PA-C   50 mg at 08/26/20 2210  . menthol-cetylpyridinium (CEPACOL) lozenge 3 mg  1 lozenge Oral PRN Costella, Vista Mink, PA-C       Or  . phenol (CHLORASEPTIC) mouth spray 1 spray  1 spray Mouth/Throat PRN Costella, Vista Mink, PA-C      . methocarbamol (ROBAXIN) tablet 1,000 mg  1,000 mg Oral Q6H PRN Georganna Skeans, MD   1,000 mg at 08/23/20 1028  . ondansetron (ZOFRAN) tablet 4 mg  4 mg Oral Q6H PRN Costella, Vista Mink, PA-C       Or  . ondansetron (ZOFRAN) injection 4 mg  4 mg Intravenous Q6H PRN Costella, Vista Mink, PA-C      . oxyCODONE (Oxy IR/ROXICODONE) immediate release tablet 5-10 mg  5-10 mg Oral Q3H PRN Costella, Vincent J, PA-C   10 mg at 08/27/20 1552  . oxyCODONE (OXYCONTIN) 12 hr tablet 15 mg  15 mg Oral Q12H Ashok Pall, MD   15 mg at 08/27/20 1236  . pantoprazole (PROTONIX) EC tablet 40 mg  40 mg Oral Daily Traci Sermon, PA-C   40 mg at 08/27/20 0932  . pregabalin (LYRICA) capsule 75 mg  75 mg Oral BID Traci Sermon, PA-C   75 mg at 08/27/20 0934  . senna (SENOKOT) tablet 8.6 mg  1 tablet Oral BID Costella, Vincent J, PA-C   8.6 mg at 08/27/20 0932  . senna-docusate (Senokot-S) tablet 1 tablet  1 tablet Oral QHS PRN Traci Sermon, PA-C   1 tablet at 08/22/20 0932  . sodium phosphate (FLEET) 7-19 GM/118ML enema 1 enema  1 enema Rectal Once PRN Costella, Vista Mink, PA-C      . tamsulosin (FLOMAX) capsule 0.4 mg  0.4 mg Oral Daily Costella, Vincent J, PA-C   0.4 mg at 08/27/20 0934  . tiZANidine (ZANAFLEX) tablet 4 mg  4 mg Oral Q6H PRN Traci Sermon, PA-C   4 mg at 08/26/20 2020  . torsemide (DEMADEX) tablet 5 mg  5 mg Oral Daily Costella, Vista Mink, PA-C   5 mg at 08/27/20 3557     Discharge Medications: Please see discharge summary for a list of discharge medications.  Relevant  Imaging Results:  Relevant Lab Results:   Additional Information SSN [id number] Pt is covid vaccinated x3  Roshaunda Starkey Nicole Kindred, LCSW

## 2020-08-28 NOTE — Plan of Care (Signed)
  Problem: Safety: Goal: Ability to remain free from injury will improve Outcome: Progressing   Problem: Education: Goal: Knowledge of the prescribed therapeutic regimen will improve Outcome: Progressing Goal: Understanding of discharge needs will improve Outcome: Progressing   Problem: Education: Goal: Knowledge of General Education information will improve Description: Including pain rating scale, medication(s)/side effects and non-pharmacologic comfort measures Outcome: Progressing   Problem: Health Behavior/Discharge Planning: Goal: Ability to manage health-related needs will improve Outcome: Progressing   Problem: Clinical Measurements: Goal: Respiratory complications will improve Outcome: Progressing   Problem: Activity: Goal: Risk for activity intolerance will decrease Outcome: Progressing   Problem: Nutrition: Goal: Adequate nutrition will be maintained Outcome: Progressing   Problem: Coping: Goal: Level of anxiety will decrease Outcome: Progressing   Problem: Elimination: Goal: Will not experience complications related to urinary retention Outcome: Progressing   Problem: Pain Managment: Goal: General experience of comfort will improve Outcome: Progressing   Problem: Safety: Goal: Ability to remain free from injury will improve Outcome: Progressing   Problem: Skin Integrity: Goal: Risk for impaired skin integrity will decrease Outcome: Progressing

## 2020-08-28 NOTE — TOC Progression Note (Signed)
Transition of Care [facility]) - Progression Note    Patient Details  Name: Kathryn [name] MRN: [id number] Date of Birth: 1970/08/13  Transition of Care [facility]) CM/SW [place], [place] Phone Number: 08/28/2020, 12:22 PM  Clinical Narrative:     CSW spoke with patient via phone- CSW verbally provided bed offers. Patient selected [facility].   CSW informed [facility] Admission -patient has selected their facility- they confirmed that can accept patient. CSW was advised if patient not fully vaccinated (received vaccines and all boosters ) she will need to quarantine for 10 days.  TOC will continue to follow and assist with discharge planning.   Thurmond Butts, MSW, LCSW Clinical Social Worker   Expected Discharge Plan: Skilled Nursing Facility Barriers to Discharge: Continued Medical Work up  Expected Discharge Plan and Services Expected Discharge Plan: [place]       Living arrangements for the past 2 months: Apartment                                       Social Determinants of Health (SDOH) Interventions    Readmission Risk Interventions No flowsheet data found.

## 2020-08-28 NOTE — Progress Notes (Signed)
She looks better today.  She states she sat in a chair and walked some yesterday.  Complains of some "weakness in the feet" but feels like her legs are much better.  Continue to encourage to mobilize.  Pleased with her progress.  Slow but steady.

## 2020-08-29 MED ORDER — WHITE PETROLATUM EX OINT
TOPICAL_OINTMENT | CUTANEOUS | Status: AC
  Filled 2020-08-29: qty 28.35

## 2020-08-29 NOTE — Progress Notes (Signed)
Occupational Therapy Treatment Patient Details Name: Kathryn [name] MRN: [id number] DOB: Aug 27, 1970 Today's Date: 08/29/2020    History of present illness Pt is a 50 y.o. F who presents s/p L3-5 PLIF. During admission, developed numbness in BLE. Significant PMH: bipolar disorder, COPD, L patella-femoral arthroplasty.   OT comments  Pt making steady progress towards OT goals this session. Pt received seated EOB with RN present, pt agreeable to OT intervention. Pt continues to present with cognitive deficits, impaired ability to recall precautions, decreased strength and functional endurance and increased pain. Pt with no recall of back precautions needing full education on back precautions as well as compensatory methods for completing ADLs, pt was able to recall that she needed her brace prior to standing up. Pt currently requires MOD A for UB ADLs and MIN - MOD A +2 for functional mobility ~ 7ft to recliner. Noted pt was denied CIR therefore updated DC plan to SNF as indicated below. Will follow acutely per POC.    Follow Up Recommendations  SNF;Supervision/Assistance - 24 hour    Equipment Recommendations  Other (comment) (TBD at next venue of care)    Recommendations for Other Services      Precautions / Restrictions Precautions Precautions: Back;Fall Precaution Booklet Issued: No Precaution Comments: pt unable to state precautions, reviewed all precautions and demo'ed compensatory methods in relation to ADLs Required Braces or Orthoses: Spinal Brace Spinal Brace: Lumbar corset Restrictions Weight Bearing Restrictions: No       Mobility Bed Mobility               General bed mobility comments: sitting EOB upon arrival    Transfers Overall transfer level: Needs assistance Equipment used: Rolling walker (2 wheeled) Transfers: Sit to/from [facility] Sit to Stand: Mod assist;+2 physical assistance Stand pivot transfers: Min assist;+2 physical  assistance;+2 safety/equipment       General transfer comment: MOD A +2 to rise from EOB , cues for hand placement, MIN A +2 to stand pivot ~ 59ft to recliner with pt presenting with slow guarded gait    Balance Overall balance assessment: Needs assistance Sitting-balance support: Feet supported Sitting balance-Leahy Scale: Fair Sitting balance - Comments: sitting EOB upon arrival with no UE support   Standing balance support: Bilateral upper extremity supported Standing balance-Leahy Scale: Poor Standing balance comment: reliant on BUE support                           ADL either performed or assessed with clinical judgement   ADL Overall ADL's : Needs assistance/impaired                 Upper Body Dressing : Moderate assistance;Sitting;Minimal assistance Upper Body Dressing Details (indicate cue type and reason): MIN A to don new gown, MOD A to don brace from EOB   Lower Body Dressing Details (indicate cue type and reason): education provided on LB compensatory methods for ADLs in order to maintain back preacautions Toilet Transfer: Minimal assistance;Moderate assistance;RW;Ambulation;+2 for physical assistance;+2 for safety/equipment Toilet Transfer Details (indicate cue type and reason): simulated via functional mobility, MOD A +2 to rise from EOB , MIN A +2 for safety to ambulate ~ 3 ft to recliner         Functional mobility during ADLs: Minimal assistance;Moderate assistance;+2 for physical assistance;+2 for safety/equipment;Rolling walker General ADL Comments: pt continues to present with cognitive deficits, impaired ability to recall precautions, and increased pain  Vision       Perception     Praxis      Cognition Arousal/Alertness: Awake/alert Behavior During Therapy: WFL for tasks assessed/performed Overall Cognitive Status: Within Functional Limits for tasks assessed Area of Impairment: Memory;Awareness;Safety/judgement;Attention                    Current Attention Level: Selective Memory: Decreased recall of precautions   Safety/Judgement: Decreased awareness of safety;Decreased awareness of deficits Awareness: Intellectual   General Comments: decreased ability to recall precautions, can be tangential in speech needing cues to stay on task, easily distracted by extraneous stimuli        Exercises     Shoulder Instructions       General Comments      Pertinent Vitals/ Pain       Pain Assessment: 0-10 Pain Score: 10-Worst pain ever Pain Location: back Pain Descriptors / Indicators: Discomfort;Grimacing;Sharp Pain Intervention(s): Limited activity within patient's tolerance;Monitored during session;Repositioned  Home Living                                          Prior Functioning/Environment              Frequency  Min 2X/week        Progress Toward Goals  OT Goals(current goals can now be found in the care plan section)  Progress towards OT goals: Progressing toward goals  Acute Rehab OT Goals Patient Stated Goal: to do more OT Goal Formulation: With patient Time For Goal Achievement: 09/04/20 Potential to Achieve Goals: Good  Plan Discharge plan remains appropriate;Frequency remains appropriate    Co-evaluation                 AM-PAC OT "6 Clicks" Daily Activity     Outcome Measure   Help from another person eating meals?: None Help from another person taking care of personal grooming?: None Help from another person toileting, which includes using toliet, bedpan, or urinal?: A Lot Help from another person bathing (including washing, rinsing, drying)?: A Lot Help from another person to put on and taking off regular upper body clothing?: A Lot Help from another person to put on and taking off regular lower body clothing?: Total 6 Click Score: 15    End of Session Equipment Utilized During Treatment: Gait belt;Rolling walker;Back brace  OT Visit  Diagnosis: Unsteadiness on feet (R26.81);Pain Pain - part of body:  (back)   Activity Tolerance Patient tolerated treatment well   Patient Left in chair;with call bell/phone within reach;with chair alarm set   Nurse Communication Mobility status;Other (comment) (+2 back to bed with Rw)        Time: 8469-6295 OT Time Calculation (min): 21 min  Charges: OT General Charges $OT Visit: 1 Visit OT Treatments $Self Care/Home Management : 8-22 mins  Harley Alto., COTA/L Acute Rehabilitation Services [place] 08/29/2020, 2:00 PM

## 2020-08-29 NOTE — Progress Notes (Signed)
Subjective: Patient reports she did better yesterday, sat in the chair for a while, complains of some numbness in her feet but less pain  Objective: Vital signs in last 24 hours: Temp:  [98.4 F (36.9 C)-99.6 F (37.6 C)] 98.4 F (36.9 C) (05/15 0627) Pulse Rate:  [87-90] 90 (05/15 0627) Resp:  [16-18] 18 (05/15 0627) BP: (90-112)/(61-93) 112/93 (05/15 0627) SpO2:  [100 %] 100 % (05/15 0627)  Intake/Output from previous day: 05/14 0701 - 05/15 0700 In: 240 [P.O.:240] Out: 1050 [Urine:1050] Intake/Output this shift: No intake/output data recorded.  Awake and alert, moves her legs fairly well in bed.  Lab Results: Lab Results  Component Value Date   WBC 17.1 (H) 08/21/2020   HGB 10.9 (L) 08/21/2020   HCT 34.1 (L) 08/21/2020   MCV 89.3 08/21/2020   PLT 533 (H) 08/21/2020   Lab Results  Component Value Date   INR 1.1 08/21/2020   BMET Lab Results  Component Value Date   NA 133 (L) 08/23/2020   K 4.0 08/23/2020   CL 98 08/23/2020   CO2 26 08/23/2020   GLUCOSE 112 (H) 08/23/2020   BUN 7 08/23/2020   CREATININE 0.89 08/23/2020   CALCIUM 8.8 (L) 08/23/2020    Studies/Results: No results found.  Assessment/Plan: Await snf, encouraged mobilization  Estimated body mass index is 37.77 kg/m as calculated from the following:   Height as of this encounter: 4\' 11"  (1.499 m).   Weight as of this encounter: 84.8 kg.    LOS: 9 days    Eustace Moore 08/29/2020, 8:21 AM

## 2020-08-30 LAB — RESP PANEL BY RT-PCR (FLU A&B, COVID) ARPGX2
Influenza A by PCR: NEGATIVE
Influenza B by PCR: NEGATIVE
SARS Coronavirus 2 by RT PCR: NEGATIVE

## 2020-08-30 NOTE — Progress Notes (Signed)
CSW spoke with Rolla Plate at [facility] who states facility will initiate insurance authorization.  Madilyn Fireman, MSW, LCSW Transitions of Care  Clinical Social Worker II [phone]

## 2020-08-30 NOTE — Progress Notes (Signed)
Physical Therapy Treatment Patient Details Name: Kathryn [name] MRN: [id number] DOB: March 11, 1971 Today's Date: 08/30/2020    History of Present Illness Pt is a 50 y.o. F who presents s/p L3-5 PLIF 08/20/20. During admission, developed numbness in BLE. Significant PMH: bipolar disorder, COPD, L patella-femoral arthroplasty.    PT Comments    Pt only able to walk a short distance across the room to her recliner chair, heavily reliant on bil UEs for support on RW.  Pt would be safest progressing gait distance with chair to follow to encourage going as far as she can before sitting down.  She was able to report all 3 back precautions to me today. PT will continue to follow acutely for safe mobility progression.   Follow Up Recommendations  SNF     Equipment Recommendations  Wheelchair (measurements PT);Wheelchair cushion (measurements PT)    Recommendations for Other Services       Precautions / Restrictions Precautions Precautions: Back;Fall Precaution Comments: pt able to state 3/3 precautions Required Braces or Orthoses: Spinal Brace Spinal Brace: Lumbar corset;Applied in sitting position    Mobility  Bed Mobility Overal bed mobility: Needs Assistance Bed Mobility: Rolling;Sidelying to Sit Rolling: Modified independent (Device/Increase time) Sidelying to sit: Min assist       General bed mobility comments: Pt able to demonstrate log roll technique, min assist at trunk to come up to sitting EOB and min assist of one leg.    Transfers Overall transfer level: Needs assistance Equipment used: Rolling walker (2 wheeled) Transfers: Sit to/from Stand Sit to Stand: Min assist;From elevated surface         General transfer comment: Min assist to come to standing EOB to RW, pt self cueing for safe hand placement.  Ambulation/Gait Ambulation/Gait assistance: Min assist   Assistive device: Rolling walker (2 wheeled) Gait Pattern/deviations: Step-through pattern;Shuffle      General Gait Details: Decreased bil foot clearance, heavy reliance on bil UEs to support trunk over weak and painful legs, no signs of buclking today, just painful with decreased tolerance of further gait distance (pt did not want to try further with me today).   Stairs             Wheelchair Mobility    Modified Rankin (Stroke Patients Only)       Balance Overall balance assessment: Needs assistance Sitting-balance support: Feet supported;Bilateral upper extremity supported Sitting balance-Leahy Scale: Fair     Standing balance support: Bilateral upper extremity supported Standing balance-Leahy Scale: Poor Standing balance comment: reliant on BUE support on RW                            Cognition Arousal/Alertness: Awake/alert Behavior During Therapy: WFL for tasks assessed/performed Overall Cognitive Status: Within Functional Limits for tasks assessed                                        Exercises General Exercises - Lower Extremity Ankle Circles/Pumps: AROM;Both;10 reps Quad Sets: AROM;Both;10 reps Gluteal Sets: AROM;Both;10 reps    General Comments        Pertinent Vitals/Pain Pain Assessment: Faces Faces Pain Scale: Hurts even more Pain Location: back bottoms of feet Pain Descriptors / Indicators: Discomfort;Grimacing;Sharp Pain Intervention(s): Limited activity within patient's tolerance;Monitored during session;Repositioned    Home Living  Prior Function            PT Goals (current goals can now be found in the care plan section) Acute Rehab PT Goals Patient Stated Goal: to do more Progress towards PT goals: Progressing toward goals    Frequency    Min 5X/week      PT Plan Current plan remains appropriate    Co-evaluation              AM-PAC PT "6 Clicks" Mobility   Outcome Measure  Help needed turning from your back to your side while in a flat bed without using  bedrails?: A Little Help needed moving from lying on your back to sitting on the side of a flat bed without using bedrails?: A Little Help needed moving to and from a bed to a chair (including a wheelchair)?: A Little Help needed standing up from a chair using your arms (e.g., wheelchair or bedside chair)?: A Little Help needed to walk in hospital room?: A Little Help needed climbing 3-5 steps with a railing? : A Lot 6 Click Score: 17    End of Session Equipment Utilized During Treatment: Back brace;Gait belt Activity Tolerance: Patient limited by pain Patient left: in chair;with call bell/phone within reach;with chair alarm set   PT Visit Diagnosis: Pain;Difficulty in walking, not elsewhere classified (R26.2) Pain - Right/Left:  (low back) Pain - part of body:  (low back)     Time: 5003-7048 PT Time Calculation (min) (ACUTE ONLY): 36 min  Charges:  $Gait Training: 8-22 mins $Therapeutic Activity: 8-22 mins                     Verdene Lennert, PT, DPT  Acute Rehabilitation [phone] pager [phone]) [phone] office

## 2020-08-30 NOTE — Progress Notes (Signed)
Patient's foley catheter dc'd per MD order. No complaints at this time and patient tolerated procedure well. Instructed patient that she needs to void within 6 hours (by 1630) and if not the foley catheter will need to be re-inserted and she will discharge with it and follow up with urology. Patient verbalizes understanding. NT is aware that Foley is no longer in place.

## 2020-08-30 NOTE — Progress Notes (Addendum)
  NEUROSURGERY PROGRESS NOTE   No issues overnight.  Back pain and N/T in ble waxes/wanes  EXAM:  BP 118/67   Pulse 64   Temp 98.6 F (37 C)   Resp 17   Ht 4\' 11"  (1.499 m)   Wt 84.8 kg   SpO2 98%   BMI 37.77 kg/m   Awake, alert, oriented  Speech fluent, appropriate  CN grossly intact  Inconsistent exam. Does MAEW, at least antigravity BLE Incision: c/d/i  IMPRESSION/PLAN 50 y.o. female S/pL3-4 L4-5 decompression and fusion.Inconsistent exam. Continuing to progress. - urinary retention: voiding trial. If unable to void, replace foley - SNF pending. COVID swab ordered

## 2020-08-31 ENCOUNTER — Inpatient Hospital Stay (HOSPITAL_COMMUNITY): Payer: Medicaid Other

## 2020-08-31 DIAGNOSIS — M7989 Other specified soft tissue disorders: Secondary | ICD-10-CM | POA: Diagnosis not present

## 2020-08-31 MED ORDER — APIXABAN 5 MG PO TABS: 10.0000 mg | ORAL_TABLET | Freq: Two times a day (BID) | ORAL | Status: DC

## 2020-08-31 MED ORDER — APIXABAN 5 MG PO TABS: 5.0000 mg | ORAL_TABLET | Freq: Two times a day (BID) | ORAL | Status: DC

## 2020-08-31 MED ORDER — APIXABAN 5 MG PO TABS
10.0000 mg | ORAL_TABLET | Freq: Two times a day (BID) | ORAL | Status: DC
  Administered 2020-08-31 – 2020-09-02 (×4): 10 mg via ORAL
  Filled 2020-08-31 (×4): qty 2

## 2020-08-31 NOTE — Progress Notes (Signed)
ANTICOAGULATION CONSULT NOTE - Initial Consult  Pharmacy Consult for Apixaban Indication: DVT  Allergies  Allergen Reactions  . Clonopin [Clonazepam] Anaphylaxis    Swelling of the tongue and mouth.   . Fish Allergy Anaphylaxis, Shortness Of Breath and Swelling  . Flexeril [Cyclobenzaprine Hcl] Shortness Of Breath  . Ibuprofen Anaphylaxis and Hives  . Shellfish Allergy Anaphylaxis  . Tylenol [Acetaminophen] Anaphylaxis  . Ace Inhibitors Cough  . Cyclobenzaprine   . Tramadol Nausea And Vomiting    Upset stomach  . Trazodone And Nefazodone Hives    Patient Measurements: Height: 4\' 11"  (149.9 cm) Weight: 84.8 kg (187 lb) IBW/kg (Calculated) : 43.2   Vital Signs: Temp: 97.7 F (36.5 C) (05/17 1301) Temp Source: Oral (05/17 1301) BP: 96/61 (05/17 1301) Pulse Rate: 83 (05/17 1301)  Labs: No results for input(s): HGB, HCT, PLT, APTT, LABPROT, INR, HEPARINUNFRC, HEPRLOWMOCWT, CREATININE, CKTOTAL, CKMB, TROPONINIHS in the last 72 hours.  Estimated Creatinine Clearance: 70.6 mL/min (by C-G formula based on SCr of 0.89 mg/dL).   Medical History: Past Medical History:  Diagnosis Date  . Anxiety   . Arthritis   . Asthma   . Bipolar disorder ([place])   . Chronic back pain   . COPD (chronic obstructive pulmonary disease) (H[facility])    Chronic bronchitis  . Depression   . GERD (gastroesophageal reflux disease)   . Hypertension   . Neuropathy   . Osteoarthritis of left knee, 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  . Sleep apnea 04/2020   GETTING A cpap  . Suicidal ideation 12/24/2018    Assessment: Kathryn [name] is a 50 y.o. female who was admitted forL3-4 L4-5 PLIF. Found to have bilateral lower extremity DVTs. Pharmacy consulted to start Apixaban.   Plan:  Apixaban 10 mg po bid for 7 days, then 5 mg po bid   Alanda Slim, PharmD, [facility] Clinical Pharmacist Please see AMION for all Pharmacists' Contact Phone Numbers 08/31/2020, 5:30 PM

## 2020-08-31 NOTE — Progress Notes (Signed)
OT Cancellation Note  Patient Details Name: Kathryn [name] MRN: [id number] DOB: 1971/02/02   Cancelled Treatment:    Reason Eval/Treat Not Completed: Patient declined, no reason specified. Pt refused, reporting she just worked with physical therapy and did not want to get out of bed again. OT will follow up as time allows.   [name], OTR/L Acute Rehabilitation  [name] [name] 08/31/2020, 3:35 PM

## 2020-08-31 NOTE — Progress Notes (Addendum)
  NEUROSURGERY PROGRESS NOTE   No issues overnight.  Sitting at edge of bed this am Complains of swelling BLE No new N/T/W Able to ambulate yesterday  EXAM:  BP 106/66 (BP Location: Left Arm)   Pulse 79   Temp 98.9 F (37.2 C) (Oral)   Resp 17   Ht 4\' 11"  (1.499 m)   Wt 84.8 kg   SpO2 99%   BMI 37.77 kg/m   Awake, alert, oriented  Speech fluent, appropriate  CN grossly intact  MAE at least antigravity Trace edema LLE, no edema RLE Incision: not examined today  IMPRESSION/PLAN 50 y.o. female S/pL3-4 L4-5 decompression and fusion.Continuing to progress. - urinary retention:resolved - BLE subjective edema: Not overly convincing of DVT, but has been essentially bed bound since surgery. B/L venous doppler.  - SNF pending. Ready for discharge from NS perspective

## 2020-08-31 NOTE — Discharge Summary (Addendum)
Physician Discharge Summary  Patient ID: Kathryn [name] MRN: [id number] DOB/AGE: [birth year] 50 y.o.  Admit date: 08/20/2020 Discharge date: 09/01/2020  Admission Diagnoses:  Lumbar spinal stenosis Acute urinary retention  Discharge Diagnoses:  Same Active Problems:   Lumbar spinal stenosis  Discharged Condition: Stable  Hospital Course:  Kathryn [name] is a 50 y.o. female who was admitted for the below procedure. Post op complicated by intractable pain, inconsistent neuro exam, acute urinary retention (s/p foley catheter, removed 5/16, able to void5/16), BLE DVT now on Eliquis. Patient worked with PT/OT. Rec SNF at discharge. On 5/17, bed available and insurance approval obtained. Due to new diagnosis of DVT on 5/17, patient was monitored overnight. No complications. Discharged 5/18 in hemodynamically stable condition.   Patient discharged in hemodynamically stable condition.  Treatments: Surgery L3-4 L4-5 PLIF  Discharge Exam: Blood pressure 112/79, pulse 64, temperature 97.8 F (36.6 C), temperature source Oral, resp. rate 17, height 4\' 11"  (1.499 m), weight 84.8 kg, SpO2 100 %. Awake, alert, oriented Speech fluent, appropriate CN grossly intact MAE, inconsistent exam Wound c/d/i  Disposition: Discharge disposition: 03-Skilled Nursing Facility     SNF  Discharge Instructions    Call MD for:  difficulty breathing, headache or visual disturbances   Complete by: As directed    Call MD for:  persistant dizziness or light-headedness   Complete by: As directed    Call MD for:  redness, tenderness, or signs of infection (pain, swelling, redness, odor or green/yellow discharge around incision site)   Complete by: As directed    Call MD for:  severe uncontrolled pain   Complete by: As directed    Call MD for:  temperature >100.4   Complete by: As directed    Driving Restrictions   Complete by: As directed    Do not drive until given clearance.   Increase activity  slowly   Complete by: As directed    May shower / Bathe   Complete by: As directed    No dressing needed   Complete by: As directed      Allergies as of 09/01/2020      Reactions   Clonopin [clonazepam] Anaphylaxis   Swelling of the tongue and mouth.    Fish Allergy Anaphylaxis, Shortness Of Breath, Swelling   Flexeril [cyclobenzaprine Hcl] Shortness Of Breath   Ibuprofen Anaphylaxis, Hives   Shellfish Allergy Anaphylaxis   Tylenol [acetaminophen] Anaphylaxis   Ace Inhibitors Cough   Cyclobenzaprine    Tramadol Nausea And Vomiting   Upset stomach   Trazodone And Nefazodone Hives      Medication List    STOP taking these medications   HYDROcodone-acetaminophen 5-325 MG tablet Commonly known as: NORCO/VICODIN   oxyCODONE 5 MG immediate release tablet Commonly known as: Roxicodone     TAKE these medications   albuterol 108 (90 Base) MCG/ACT inhaler Commonly known as: VENTOLIN HFA Inhale 2 puffs into the lungs every 6 (six) hours as needed for wheezing or shortness of breath.   albuterol (2.5 MG/3ML) 0.083% nebulizer solution Commonly known as: PROVENTIL Take 2.5 mg by nebulization every 6 (six) hours as needed for wheezing or shortness of breath.   amLODipine 5 MG tablet Commonly known as: NORVASC Take 5 mg by mouth daily.   apixaban 5 MG Tabs tablet Commonly known as: ELIQUIS Take 2 tablets (10 mg total) by mouth 2 (two) times daily.   apixaban 5 MG Tabs tablet Commonly known as: ELIQUIS Take 1 tablet (5 mg total) by  mouth 2 (two) times daily. Start taking on: Sep 07, 2020   Boric Acid Vaginal 600 MG Supp Place 600 mg vaginally as directed. Place one capsule inside the vagina at bedtime every other night for 3 doses. Use once a week at bedtime thereafter for foul odor.   budesonide-formoterol 80-4.5 MCG/ACT inhaler Commonly known as: Symbicort Inhale 2 puffs into the lungs 2 (two) times daily.   celecoxib 200 MG capsule Commonly known as: CeleBREX Take 1  capsule (200 mg total) by mouth 2 (two) times daily. For pain   citalopram 20 MG tablet Commonly known as: CELEXA Take 20 mg by mouth daily.   cloNIDine 0.1 MG tablet Commonly known as: CATAPRES TAKE 1 TABLET BY MOUTH ONCE DAILY FOR BLOOD PRESSURE AND ANXIETY.   hydrOXYzine 50 MG capsule Commonly known as: VISTARIL Take 1 capsule (50 mg total) by mouth daily as needed for anxiety (Insomnia). What changed: when to take this   methocarbamol 500 MG tablet Commonly known as: ROBAXIN Take 500 mg by mouth in the morning and at bedtime.   Myfembree 40-1-0.5 MG Tabs Generic drug: Relugolix-Estradiol-Norethind Take 1 tablet by mouth daily.   omeprazole 20 MG capsule Commonly known as: PRILOSEC Take 1 capsule (20 mg total) by mouth daily.   ondansetron 4 MG disintegrating tablet Commonly known as: Zofran ODT Take 1 tablet (4 mg total) by mouth every 8 (eight) hours as needed for nausea or vomiting.   ondansetron 8 MG disintegrating tablet Commonly known as: Zofran ODT Take 1 tablet (8 mg total) by mouth every 8 (eight) hours as needed for nausea or vomiting.   pregabalin 75 MG capsule Commonly known as: LYRICA Take 1 capsule (75 mg total) by mouth 2 (two) times daily.   tiZANidine 2 MG tablet Commonly known as: ZANAFLEX Take 1 tablet (2 mg total) by mouth 2 (two) times daily as needed for muscle spasms.   torsemide 5 MG tablet Commonly known as: DEMADEX Take 1 tablet (5 mg total) by mouth daily.   Vitamin D (Ergocalciferol) 1.25 MG (50000 UNIT) Caps capsule Commonly known as: DRISDOL Take 50,000 Units by mouth every 7 (seven) days.            Discharge Care Instructions  (From admission, onward)         Start     Ordered   08/31/20 0000  No dressing needed        08/31/20 1610          Contact information for follow-up providers    Lisbeth Renshaw, MD. Schedule an appointment as soon as possible for a visit in 2 week(s).   Specialty:  Neurosurgery Contact information: [address] [place] [address] [phone]            Contact information for after-discharge care    Destination    [facility] .   Service: Skilled Nursing Contact information: [address] [place] [address] [phone]                  Signed: Alyson Ingles 09/01/2020, 8:50 AM

## 2020-08-31 NOTE — Progress Notes (Addendum)
  NEUROSURGERY PROGRESS NOTE   BLE ultrasound positive for DVT.  Will consult TH for assistance with anti-coagulation.  Discussed with Dr Sloan Leiter regarding patient. Uncomplicated DVT. Okay to start Eliquis per pharmacy consult.  Appreciate assistance.  Ferne Reus, PA-C [facility] [employer]

## 2020-08-31 NOTE — Progress Notes (Signed)
Physical Therapy Treatment Patient Details Name: Kathryn [name] MRN: [id number] DOB: [birth year] Today's Date: 08/31/2020    History of Present Illness Pt is a 50 y.o. F who presents s/p L3-5 PLIF 08/20/20. During admission, developed numbness in BLE. Significant PMH: bipolar disorder, COPD, L patella-femoral arthroplasty.    PT Comments    Pt sitting EOB with LEs dangling over EOB upon PT arrival to room, agreeable to OOB mobility. Pt expresses severe back pain at rest and with mobility, but performs short distance gait training with light assist for steadying and sequencing gait with RW. Pt incontinent of urine x2, requires pericare assist from PT both times per pt request. PT to continue to follow acutely, SNF remains appropriate dispo.    Follow Up Recommendations  SNF     Equipment Recommendations  Wheelchair (measurements PT);Wheelchair cushion (measurements PT)    Recommendations for Other Services       Precautions / Restrictions Precautions Precautions: Back;Fall Precaution Comments: intermittent cuing needed to avoid twisting motion Required Braces or Orthoses: Spinal Brace Spinal Brace: Lumbar corset;Applied in sitting position Restrictions Weight Bearing Restrictions: No    Mobility  Bed Mobility Overal bed mobility: Needs Assistance             General bed mobility comments: sitting EOB with LEs dangling upon PT arrival to room    Transfers Overall transfer level: Needs assistance Equipment used: Rolling walker (2 wheeled) Transfers: Sit to/from [facility] Sit to Stand: Min assist Stand pivot transfers: Min assist       General transfer comment: light min assist for rise and steadying upon standing, STS x3 from EOB, BSC, and recliner. Pt incontinent of urine during transitional movements x2.  Ambulation/Gait Ambulation/Gait assistance: Min assist Gait Distance (Feet): 5 Feet Assistive device: Rolling walker (2 wheeled) Gait  Pattern/deviations: Step-through pattern;Shuffle;Trunk flexed Gait velocity: decr   General Gait Details: min assist to steady and guide RW, verbal cuing for placement in RW, upright posture   Stairs             Wheelchair Mobility    Modified Rankin (Stroke Patients Only)       Balance Overall balance assessment: Needs assistance Sitting-balance support: Feet supported;Bilateral upper extremity supported Sitting balance-Leahy Scale: Fair     Standing balance support: Bilateral upper extremity supported Standing balance-Leahy Scale: Poor Standing balance comment: reliant on BUE support on RW                            Cognition Arousal/Alertness: Awake/alert Behavior During Therapy: WFL for tasks assessed/performed Overall Cognitive Status: Within Functional Limits for tasks assessed                           Safety/Judgement: Decreased awareness of safety     General Comments: Cues to maintain spinal precautions during functional activity, safety cues throughout      Exercises General Exercises - Lower Extremity Ankle Circles/Pumps: AROM;Both;5 reps;Seated Long Arc Quad: AROM;Both;5 reps;Seated    General Comments General comments (skin integrity, edema, etc.): mild bilat feet swelling R>L      Pertinent Vitals/Pain Pain Assessment: Faces Faces Pain Scale: Hurts even more Pain Location: back, R calf, bilat feet L>R Pain Descriptors / Indicators: Discomfort;Grimacing;Sore Pain Intervention(s): Limited activity within patient's tolerance;Monitored during session;Repositioned    Home Living  Prior Function            PT Goals (current goals can now be found in the care plan section) Acute Rehab PT Goals Patient Stated Goal: to do more PT Goal Formulation: With patient Time For Goal Achievement: 09/04/20 Potential to Achieve Goals: Good Progress towards PT goals: Progressing toward goals     Frequency    Min 5X/week      PT Plan Current plan remains appropriate    Co-evaluation              AM-PAC PT "6 Clicks" Mobility   Outcome Measure  Help needed turning from your back to your side while in a flat bed without using bedrails?: A Little Help needed moving from lying on your back to sitting on the side of a flat bed without using bedrails?: A Little Help needed moving to and from a bed to a chair (including a wheelchair)?: A Little Help needed standing up from a chair using your arms (e.g., wheelchair or bedside chair)?: A Little Help needed to walk in hospital room?: A Little Help needed climbing 3-5 steps with a railing? : A Lot 6 Click Score: 17    End of Session Equipment Utilized During Treatment: Back brace;Gait belt Activity Tolerance: Patient limited by pain;Patient limited by fatigue Patient left: in chair;with call bell/phone within reach;with chair alarm set Nurse Communication: Mobility status;Other (comment) (needs fresh bed linens, pt up in chair) PT Visit Diagnosis: Pain;Difficulty in walking, not elsewhere classified (R26.2) Pain - Right/Left:  (low back) Pain - part of body:  (low back)     Time: 1332-1400 PT Time Calculation (min) (ACUTE ONLY): 28 min  Charges:  $Therapeutic Activity: 23-37 mins                     Stacie Glaze, PT DPT Acute Rehabilitation Services Pager [phone]  Office [phone]   [place] 08/31/2020, 3:00 PM

## 2020-08-31 NOTE — Progress Notes (Signed)
Lower extremity venous bilateral study completed.  Preliminary results relayed to Jasmin, RN and Landry Dyke for [place], MD.  See CV Proc for preliminary results report.   Darlin Coco, RDMS, RVT

## 2020-08-31 NOTE — Progress Notes (Signed)
CSW spoke with Tressa Busman at [facility] who states the insurance authorization is still pending at this time. Tressa Busman to return call to [place] once updates become available.  Madilyn Fireman, MSW, LCSW Transitions of Care  Clinical Social Worker II [phone]

## 2020-08-31 NOTE — Progress Notes (Signed)
Patient has new DVT. To be started on Eliquis this evening. Per Dr Cato Mulligan will hold for evaluation until tomorrow. Spoke with Adonis Brook at [facility] to make them aware that patient will likely DC tomorrow.

## 2020-09-01 MED ORDER — APIXABAN 5 MG PO TABS: 10.0000 mg | ORAL_TABLET | Freq: Two times a day (BID) | ORAL | Status: DC

## 2020-09-01 MED ORDER — APIXABAN 5 MG PO TABS: 5.0000 mg | ORAL_TABLET | Freq: Two times a day (BID) | ORAL | Status: DC

## 2020-09-01 NOTE — Social Work (Signed)
Kathryn [name] 04/24/1970   Please be advised that the above-named patient will require a short-term nursing home stay - anticipated 30 days or less for rehabilitation and strengthening.  The plan is for return home.

## 2020-09-01 NOTE — Progress Notes (Signed)
Physical Therapy Treatment Patient Details Name: Kathryn [name] MRN: [id number] DOB: June 21, 1970 Today's Date: 09/01/2020    History of Present Illness Pt is a 50 y.o. F who presents s/p L3-5 PLIF 08/20/20. During admission, developed numbness in BLE.  Pt positive for bil DVT (one acute and one subacute). Significant PMH: bipolar disorder, COPD, L patella-femoral arthroplasty.    PT Comments    Pt's session today limited by multiple bouts of urine incontinence.  She assisted with self cleaning and we discussed back precautions during her functional task of cleaning up after incontinent episodes.   She is overall min assist and remains appropriate for SNF rehab at discharge as she will need to progress gait distance to be safe to go home.   Follow Up Recommendations  SNF     Equipment Recommendations  Wheelchair (measurements PT);Wheelchair cushion (measurements PT);Rolling walker with 5" wheels;Other (comment) (youth/short RW)    Recommendations for Other Services       Precautions / Restrictions Precautions Precautions: Back;Fall Required Braces or Orthoses: Spinal Brace Spinal Brace: Lumbar corset;Applied in sitting position    Mobility  Bed Mobility Overal bed mobility: Needs Assistance Bed Mobility: Rolling;Sidelying to Sit Rolling: Modified independent (Device/Increase time) Sidelying to sit: Min assist       General bed mobility comments: Pt able to roll with cues for log roll technique using bed rail, min assist to support trunk during transition to sitting EOB.  Cues to avoid twisting.    Transfers Overall transfer level: Needs assistance Equipment used: Rolling walker (2 wheeled) Transfers: Sit to/from [facility] Sit to Stand: Min assist         General transfer comment: min assist to steady pt and RW.  Min assist to support pt for balance during pivotal steps to [facility] while having urine incontinence.  Sit to stand several times for cleaning after  two episodes of urine incontinence.  Ambulation/Gait Ambulation/Gait assistance: Min assist Gait Distance (Feet): 6 Feet Assistive device: Rolling walker (2 wheeled) Gait Pattern/deviations: Step-through pattern;Shuffle     General Gait Details: decreased bil foot clearance, min assist for steadying during gait and support/assist to steer RW.   Stairs             Wheelchair Mobility    Modified Rankin (Stroke Patients Only)       Balance Overall balance assessment: Needs assistance Sitting-balance support: Feet supported;No upper extremity supported Sitting balance-Leahy Scale: Good     Standing balance support: Bilateral upper extremity supported;Single extremity supported Standing balance-Leahy Scale: Poor Standing balance comment: needs support from RW and/or therapist.                            Cognition Arousal/Alertness: Awake/alert Behavior During Therapy: WFL for tasks assessed/performed Overall Cognitive Status: Impaired/Different from baseline                                 General Comments: Pt reports and is "talking out of her head" but relays it is the pain medicine.      Exercises      General Comments General comments (skin integrity, edema, etc.): Assisted pt with pericare and clean up/gown change sock change after multiple incontinent episodes.  We spoke about not breaking her back precautions as she assisted in wiping herself down.      Pertinent Vitals/Pain Pain Assessment: Faces Faces Pain Scale:  Hurts even more Pain Location: back, LLE Pain Intervention(s): Limited activity within patient's tolerance;Monitored during session;Repositioned    Home Living                      Prior Function            PT Goals (current goals can now be found in the care plan section) Progress towards PT goals: Progressing toward goals    Frequency    Min 5X/week      PT Plan Current plan remains  appropriate    Co-evaluation              AM-PAC PT "6 Clicks" Mobility   Outcome Measure  Help needed turning from your back to your side while in a flat bed without using bedrails?: A Little Help needed moving from lying on your back to sitting on the side of a flat bed without using bedrails?: A Little Help needed moving to and from a bed to a chair (including a wheelchair)?: A Little Help needed standing up from a chair using your arms (e.g., wheelchair or bedside chair)?: A Little Help needed to walk in hospital room?: A Little Help needed climbing 3-5 steps with a railing? : A Lot 6 Click Score: 17    End of Session Equipment Utilized During Treatment: Back brace;Gait belt Activity Tolerance: Patient limited by pain;Patient limited by fatigue Patient left: in chair;with call bell/phone within reach;with chair alarm set   PT Visit Diagnosis: Pain;Difficulty in walking, not elsewhere classified (R26.2) Pain - Right/Left:  (incisional) Pain - part of body:  (low back)     Time: 0623-7628 PT Time Calculation (min) (ACUTE ONLY): 52 min  Charges:  $Therapeutic Activity: 38-52 mins                    Verdene Lennert, PT, DPT  Acute Rehabilitation [phone] pager [phone]) [phone] office

## 2020-09-01 NOTE — Progress Notes (Signed)
Pt discharge cancelled per receiving facility request. Pt will be discharged tomorrow morning

## 2020-09-01 NOTE — Progress Notes (Signed)
  NEUROSURGERY PROGRESS NOTE   No issues overnight.  Dx with bilateral DVT yesterday. Started on eliquis. No complications No concerns this am Strength continues to improve  EXAM:  BP 112/79 (BP Location: Left Arm)   Pulse 64   Temp 97.8 F (36.6 C) (Oral)   Resp 17   Ht 4\' 11"  (1.499 m)   Wt 84.8 kg   SpO2 100%   BMI 37.77 kg/m   Awake, alert, oriented  Speech fluent, appropriate  CN grossly intact  MAEW. Now at least 4/5 Incision: dressing in place  IMPRESSION/PLAN 50 y.o. female S/pL3-4 L4-5 decompression and fusion.Continuing to progress. Bilateral DVT on Eliquis. Tolerating well. No complication. - Ready for D/C to SNF today

## 2020-09-01 NOTE — Progress Notes (Signed)
Occupational Therapy Treatment Patient Details Name: Kathryn [name] MRN: [id number] DOB: 10/08/1970 Today's Date: 09/01/2020    History of present illness Pt is a 50 y.o. F who presents s/p L3-5 PLIF 08/20/20. During admission, developed numbness in BLE. Significant PMH: bipolar disorder, COPD, L patella-femoral arthroplasty.   OT comments  Pt is progressing well with OT goals. This session OT and pt focused on LB dressing while seated EOB using AE to assist with donning and doffing socks, underwear, and pants. Pt required mod assist initially with education for LB dressing, however was able to pick up quickly and required less assistance. Additionally, pt worked onfunctional mobility with RW, she was able to ambulate 5-6 ft, including 2 pivots to turn around. Once standing pt required min guard for safety. OT will continue to follow up to assist with ADL's and functional mobility.    Follow Up Recommendations  SNF;Supervision/Assistance - 24 hour    Equipment Recommendations  Other (comment) (TBD at next venue)    Recommendations for Other Services      Precautions / Restrictions Precautions Precautions: Back;Fall Required Braces or Orthoses: Spinal Brace Spinal Brace: Lumbar corset;Applied in sitting position Restrictions Weight Bearing Restrictions: No       Mobility Bed Mobility Overal bed mobility: Needs Assistance Bed Mobility: Supine to Sit;Sit to Supine     Supine to sit: Min guard;HOB elevated Sit to supine: Mod assist   General bed mobility comments: To sit EOB, pt was able to use HOB elevated and railings to push/pull herself up. When returning to supine, pt needed mod assist bringing her legs back in tobed.    Transfers Overall transfer level: Needs assistance Equipment used: Rolling walker (2 wheeled) Transfers: Sit to/from Stand Sit to Stand: Min assist         General transfer comment: light min assist for rise and steadying upon standing    Balance  Overall balance assessment: Needs assistance Sitting-balance support: Feet supported Sitting balance-Leahy Scale: Good     Standing balance support: Bilateral upper extremity supported Standing balance-Leahy Scale: Fair Standing balance comment: reliant on BUE support on RW                           ADL either performed or assessed with clinical judgement   ADL Overall ADL's : Needs assistance/impaired                     Lower Body Dressing: Moderate assistance;Sitting/lateral leans;Sit to/from stand Lower Body Dressing Details (indicate cue type and reason): Education on use of reacher, long handled shoe horn, and sock aid for lower body dressing sitting EOB. Pt demonstrated use of all equipment with assistance from OT. Toilet Transfer: Minimal Production assistant, radio Details (indicate cue type and reason): simulated via functional mobility Min A to power up from EOB and min guard for transfer/functional mobility 5-6 ft from bed.         Functional mobility during ADLs: Min guard;Rolling walker General ADL Comments: Pt requires min assist to come to standing, then can ambulate a few feet with min guard for safety. Pt able to verbally recall back precautions, however demosntrates difficulty following through when ambulating and transferring.     Vision       Perception     Praxis      Cognition Arousal/Alertness: Awake/alert Behavior During Therapy: WFL for tasks assessed/performed Overall Cognitive Status: Within Functional Limits for tasks assessed  Exercises     Shoulder Instructions       General Comments VSS on RA    Pertinent Vitals/ Pain       Pain Assessment: Faces Faces Pain Scale: Hurts even more Pain Location: back, LLE Pain Descriptors / Indicators: Discomfort;Grimacing;Moaning Pain Intervention(s): Monitored during session;Repositioned;Limited activity  within patient's tolerance  Home Living                                          Prior Functioning/Environment              Frequency  Min 2X/week        Progress Toward Goals  OT Goals(current goals can now be found in the care plan section)  Progress towards OT goals: Progressing toward goals  Acute Rehab OT Goals Patient Stated Goal: To go to rehab OT Goal Formulation: With patient Time For Goal Achievement: 09/04/20 Potential to Achieve Goals: Good ADL Goals Pt Will Perform Grooming: with modified independence;sitting;standing Pt Will Perform Lower Body Bathing: with modified independence;sit to/from stand Pt Will Perform Lower Body Dressing: with modified independence;sit to/from stand;with adaptive equipment Pt Will Transfer to Toilet: with modified independence;ambulating;regular height toilet Pt Will Perform Toileting - Clothing Manipulation and hygiene: with modified independence;sit to/from stand  Plan Discharge plan remains appropriate;Frequency remains appropriate    Co-evaluation                 AM-PAC OT "6 Clicks" Daily Activity     Outcome Measure   Help from another person eating meals?: None Help from another person taking care of personal grooming?: A Little Help from another person toileting, which includes using toliet, bedpan, or urinal?: A Little Help from another person bathing (including washing, rinsing, drying)?: A Lot Help from another person to put on and taking off regular upper body clothing?: A Little Help from another person to put on and taking off regular lower body clothing?: A Lot 6 Click Score: 17    End of Session Equipment Utilized During Treatment: Rolling walker;Back brace  OT Visit Diagnosis: Unsteadiness on feet (R26.81);Pain Pain - Right/Left: Left Pain - part of body: Leg (and back)   Activity Tolerance Patient tolerated treatment well   Patient Left in bed;with call bell/phone within  reach   Nurse Communication Mobility status (Pt requesting SCD's on)        Time: 7001-7494 OT Time Calculation (min): 34 min  Charges: OT General Charges $OT Visit: 1 Visit OT Treatments $Self Care/Home Management : 23-37 mins  Maurilio Puryear H., OTR/L [place] 09/01/2020, 1:42 PM

## 2020-09-01 NOTE — TOC Progression Note (Signed)
Transition of Care [facility]) - Progression Note    Patient Details  Name: Kathryn [name] MRN: [id number] Date of Birth: [birth year]  Transition of Care [facility]) CM/SW [place], [place] Phone Number: 09/01/2020, 4:36 PM  Clinical Narrative:     CSW uploaded passr clinicals and waiting for a number to be generated. [facility] stated they have auth for the pt and they can take her tomorrow morning.  Expected Discharge Plan: [place] Barriers to Discharge: Continued Medical Work up  Expected Discharge Plan and Services Expected Discharge Plan: [place] arrangements for the past 2 months: Apartment Expected Discharge Date: 09/01/20                                     Social Determinants of Health (SDOH) Interventions    Readmission Risk Interventions No flowsheet data found.  Emeterio Reeve, Latanya Presser, [place] Social Worker [phone]

## 2020-09-02 ENCOUNTER — Emergency Department (HOSPITAL_COMMUNITY)
Admission: EM | Admit: 2020-09-02 | Discharge: 2020-09-03 | Disposition: A | Discharge status: Home / Self Care | Payer: Medicaid Other | Attending: Emergency Medicine | Admitting: Emergency Medicine

## 2020-09-02 ENCOUNTER — Encounter (HOSPITAL_COMMUNITY): Payer: Self-pay

## 2020-09-02 ENCOUNTER — Other Ambulatory Visit: Payer: Self-pay

## 2020-09-02 DIAGNOSIS — Z87891 Personal history of nicotine dependence: Secondary | ICD-10-CM | POA: Diagnosis not present

## 2020-09-02 DIAGNOSIS — N3000 Acute cystitis without hematuria: Secondary | ICD-10-CM | POA: Diagnosis not present

## 2020-09-02 DIAGNOSIS — K5900 Constipation, unspecified: Secondary | ICD-10-CM | POA: Insufficient documentation

## 2020-09-02 DIAGNOSIS — J45909 Unspecified asthma, uncomplicated: Secondary | ICD-10-CM | POA: Diagnosis not present

## 2020-09-02 DIAGNOSIS — M79604 Pain in right leg: Secondary | ICD-10-CM | POA: Diagnosis not present

## 2020-09-02 DIAGNOSIS — M79605 Pain in left leg: Secondary | ICD-10-CM | POA: Insufficient documentation

## 2020-09-02 DIAGNOSIS — J449 Chronic obstructive pulmonary disease, unspecified: Secondary | ICD-10-CM | POA: Insufficient documentation

## 2020-09-02 DIAGNOSIS — Z7901 Long term (current) use of anticoagulants: Secondary | ICD-10-CM | POA: Diagnosis not present

## 2020-09-02 DIAGNOSIS — G8918 Other acute postprocedural pain: Secondary | ICD-10-CM | POA: Insufficient documentation

## 2020-09-02 DIAGNOSIS — Z79899 Other long term (current) drug therapy: Secondary | ICD-10-CM | POA: Insufficient documentation

## 2020-09-02 DIAGNOSIS — I10 Essential (primary) hypertension: Secondary | ICD-10-CM | POA: Insufficient documentation

## 2020-09-02 LAB — URINALYSIS, ROUTINE W REFLEX MICROSCOPIC
Bilirubin Urine: NEGATIVE
Glucose, UA: NEGATIVE mg/dL
Ketones, ur: NEGATIVE mg/dL
Nitrite: POSITIVE — AB
Protein, ur: NEGATIVE mg/dL
Specific Gravity, Urine: 1.015 (ref 1.005–1.030)
WBC, UA: >50 WBC/hpf — ABNORMAL HIGH (ref 0–5)
pH: 6 (ref 5.0–8.0)

## 2020-09-02 MED ORDER — POLYETHYLENE GLYCOL 3350 17 G PO PACK: 17.0000 g | PACK | Freq: Three times a day (TID) | ORAL | 0 refills | Status: DC | PRN

## 2020-09-02 MED ORDER — HYDROMORPHONE HCL 1 MG/ML IJ SOLN: 0.5000 mg | Freq: Once | INTRAMUSCULAR | Status: DC

## 2020-09-02 MED ORDER — CEPHALEXIN 500 MG PO CAPS: 500.0000 mg | ORAL_CAPSULE | Freq: Two times a day (BID) | ORAL | 0 refills | Status: AC

## 2020-09-02 MED ORDER — MORPHINE SULFATE (PF) 4 MG/ML IV SOLN
4.0000 mg | Freq: Once | INTRAVENOUS | Status: AC
  Administered 2020-09-02: 4 mg via INTRAVENOUS
  Filled 2020-09-02: qty 1

## 2020-09-02 MED ORDER — MORPHINE SULFATE 30 MG PO TABS: 30.0000 mg | ORAL_TABLET | ORAL | 0 refills | Status: DC | PRN

## 2020-09-02 MED ORDER — MORPHINE SULFATE (PF) 4 MG/ML IV SOLN
4.0000 mg | INTRAVENOUS | Status: DC | PRN
  Administered 2020-09-03: 4 mg via INTRAVENOUS
  Filled 2020-09-02: qty 1

## 2020-09-02 MED ORDER — SODIUM CHLORIDE 0.9 % IV SOLN
1.0000 g | Freq: Once | INTRAVENOUS | Status: AC
  Administered 2020-09-03: 1 g via INTRAVENOUS
  Filled 2020-09-02: qty 10

## 2020-09-02 NOTE — ED Triage Notes (Signed)
Pt c/o bilateral leg pain after having a recent surgery.

## 2020-09-02 NOTE — Progress Notes (Signed)
  NEUROSURGERY PROGRESS NOTE   D/C held by receiving facility yesterday. Plan for discharge today. Patient seen and examined. Stable this am. Lorelee Cover for d/c  Ferne Reus, [facility]

## 2020-09-02 NOTE — ED Provider Notes (Addendum)
[facility] EMERGENCY DEPARTMENT Provider Note   CSN: [id number] Arrival date & time: 09/02/20  2057     History Chief Complaint  Patient presents with  . Leg Pain    Kathryn [name] is a 50 y.o. female who presents with a cc of pain. According to note from her SNF she was discharged with oxycodone and robaxin and refused her medications, then demanded transport to the hospital for pain management. The patient states "they didn't give me any pain medicine and 4 mg and gonna do me no good anyway!" She has BL blood clots and states that her legs hurt and she thinks she needs the heparin in her IV because it worked the best for her clots. She has is tearful and although had no complaints of weakness pta is now telling me she can't move her legs. She denies saddle anesthesia. Urinary retention or incontinence.   HPI     Past Medical History:  Diagnosis Date  . Anxiety   . Arthritis   . Asthma   . Bipolar disorder ([place])   . Chronic back pain   . COPD (chronic obstructive pulmonary disease) (HCC)    Chronic bronchitis  . Depression   . GERD (gastroesophageal reflux disease)   . Hypertension   . Neuropathy   . Osteoarthritis of left knee, 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  . Sleep apnea 04/2020   GETTING A cpap  . Suicidal ideation 12/24/2018    Patient Active Problem List   Diagnosis Date Noted  . Lumbar spinal stenosis 08/20/2020  . Insomnia 03/19/2020  . Mild persistent asthma without complication [id number]  . Need for immunization against influenza 03/19/2020  . Obesity (BMI 30-39.9) 03/19/2020  . GERD (gastroesophageal reflux disease) 03/19/2020  . Intermittent palpitations 12/17/2019  . Snoring 12/17/2019  . Excessive daytime sleepiness 12/17/2019  . Choking 12/17/2019  . 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 Parasomnia 12/17/2019  . Enlarged uterus 02/20/2018  . Screening for colorectal cancer 02/20/2018  . Routine cervical smear  02/20/2018  . Encounter to establish care 02/20/2018  . Osteoarthritis of left knee, 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  . MDD (major depressive disorder), recurrent episode, severe ([place]) 12/24/2017  . Lumbar back pain 05/11/2017  . Low vitamin B12 level 05/11/2017  . Primary hypertension 01/11/2017    Past Surgical History:  Procedure Laterality Date  . DILATATION AND CURETTAGE/HYSTEROSCOPY WITH MINERVA N/A 06/09/2020   Procedure: DILATATION AND CURETTAGE/HYSTEROSCOPY WITH MINERVA;  Surgeon: Florian Buff, MD;  Location: [facility] ORS;  Service: Gynecology;  Laterality: N/A;  . ESOPHAGOGASTRODUODENOSCOPY (EGD) WITH PROPOFOL N/A 12/25/2018   Procedure: ESOPHAGOGASTRODUODENOSCOPY (EGD) WITH PROPOFOL;  Surgeon: Rogene [place], MD;  Location: [facility] ENDO SUITE;  Service: Endoscopy;  Laterality: N/A;  . PATELLA-FEMORAL ARTHROPLASTY Left 10/08/2018   Procedure: PATELLA-FEMORAL ARTHROPLASTY;  Surgeon: Marchia Bond, MD;  Location: [facility] ORS;  Service: Orthopedics;  Laterality: Left;  . TUBAL LIGATION       OB History    Gravida  5   Para  3   Term  2   Preterm  1   AB  2   Living  3     SAB  1   IAB  1   Ectopic      Multiple      Live Births              Family History  Problem Relation Age of Onset  . Gout Paternal Grandfather   . Cirrhosis Paternal Grandfather   .  Hypertension Paternal Grandmother   . Aneurysm Paternal Grandmother   . Cirrhosis Maternal Grandmother   . Cirrhosis Maternal Grandfather   . Cancer Father   . Cirrhosis Father   . Cirrhosis Mother   . Breast cancer Sister   . Hypertension Sister   . Bronchitis Daughter   . Bronchitis Daughter   . Asthma Son   . Bronchitis Son   . Migraines Neg Hx     Social History   Tobacco Use  . Smoking status: Former Smoker    Types: Cigarettes    Quit date: 2016    Years since quitting: 6.3  . Smokeless tobacco: Never Used  . Tobacco comment: wears nicotine patches  Vaping Use  . Vaping Use: Never used  Substance  Use Topics  . Alcohol use: Not Currently  . Drug use: Not Currently    Types: Cocaine    Comment: crack  last used 2016    Home Medications Prior to Admission medications   Medication Sig Start Date End Date Taking? Authorizing Provider  albuterol (PROVENTIL HFA;VENTOLIN HFA) 108 (90 Base) MCG/ACT inhaler Inhale 2 puffs into the lungs every 6 (six) hours as needed for wheezing or shortness of breath.    [provider]  albuterol (PROVENTIL) (2.5 MG/3ML) 0.083% nebulizer solution Take 2.5 mg by nebulization every 6 (six) hours as needed for wheezing or shortness of breath.    [provider]  amLODipine (NORVASC) 5 MG tablet Take 5 mg by mouth daily.    [provider]  apixaban (ELIQUIS) 5 MG TABS tablet Take 2 tablets (10 mg total) by mouth 2 (two) times daily. 09/01/20   Costella, Vista Mink, PA-C  apixaban (ELIQUIS) 5 MG TABS tablet Take 1 tablet (5 mg total) by mouth 2 (two) times daily. 09/07/20   Costella, Vista Mink, PA-C  Boric Acid Vaginal 600 MG SUPP Place 600 mg vaginally as directed. Place one capsule inside the vagina at bedtime every other night for 3 doses. Use once a week at bedtime thereafter for foul odor. 05/28/20   Margarita Mail, PA-C  budesonide-formoterol (SYMBICORT) 80-4.5 MCG/ACT inhaler Inhale 2 puffs into the lungs 2 (two) times daily. 03/19/20   Lindell Spar, MD  celecoxib (CELEBREX) 200 MG capsule Take 1 capsule (200 mg total) by mouth 2 (two) times daily. For pain 08/16/20   Lindell Spar, MD  citalopram (CELEXA) 20 MG tablet Take 20 mg by mouth daily.    [provider]  cloNIDine (CATAPRES) 0.1 MG tablet TAKE 1 TABLET BY MOUTH ONCE DAILY FOR BLOOD PRESSURE AND ANXIETY. 06/02/20   Lindell Spar, MD  hydrOXYzine (VISTARIL) 50 MG capsule Take 1 capsule (50 mg total) by mouth daily as needed for anxiety (Insomnia). Patient taking differently: Take 50 mg by mouth at bedtime. 03/19/20   Lindell Spar, MD  methocarbamol (ROBAXIN) 500  MG tablet Take 500 mg by mouth in the morning and at bedtime.    [provider]  omeprazole (PRILOSEC) 20 MG capsule Take 1 capsule (20 mg total) by mouth daily. 08/16/20   Lindell Spar, MD  ondansetron (ZOFRAN ODT) 4 MG disintegrating tablet Take 1 tablet (4 mg total) by mouth every 8 (eight) hours as needed for nausea or vomiting. 06/01/20   Truddie Hidden, MD  ondansetron (ZOFRAN ODT) 8 MG disintegrating tablet Take 1 tablet (8 mg total) by mouth every 8 (eight) hours as needed for nausea or vomiting. 06/09/20   Florian Buff, MD  pregabalin (LYRICA) 75 MG capsule Take 1 capsule (75 mg total) by mouth 2 (two) times daily. 08/16/20   Lindell Spar, MD  Relugolix-Estradiol-Norethind (MYFEMBREE) 40-1-0.5 MG TABS Take 1 tablet by mouth daily. 08/06/20   Florian Buff, MD  tiZANidine (ZANAFLEX) 2 MG tablet Take 1 tablet (2 mg total) by mouth 2 (two) times daily as needed for muscle spasms. 08/10/20   Lindell Spar, MD  torsemide (DEMADEX) 5 MG tablet Take 1 tablet (5 mg total) by mouth daily. 08/16/20   Lindell Spar, MD  Vitamin D, Ergocalciferol, (DRISDOL) 1.25 MG (50000 UNIT) CAPS capsule Take 50,000 Units by mouth every 7 (seven) days.    [provider]    Allergies    Clonopin [clonazepam], Fish allergy, Flexeril [cyclobenzaprine hcl], Ibuprofen, Shellfish allergy, Tylenol [acetaminophen], Ace inhibitors, Cyclobenzaprine, Tramadol, and Trazodone and nefazodone  Review of Systems   Review of Systems Ten systems reviewed and are negative for acute change, except as noted in the HPI.   Physical Exam Updated Vital Signs LMP 06/07/2020   Physical Exam Vitals and nursing note reviewed.  Constitutional:      General: She is not in acute distress.    Appearance: She is well-developed. She is not diaphoretic.     Comments: Tearful, ornery  HENT:     Head: Normocephalic and atraumatic.  Eyes:     General: No scleral icterus.    Conjunctiva/sclera: Conjunctivae  normal.  Cardiovascular:     Rate and Rhythm: Normal rate and regular rhythm.     Heart sounds: Normal heart sounds. No murmur heard. No friction rub. No gallop.   Pulmonary:     Effort: Pulmonary effort is normal. No respiratory distress.     Breath sounds: Normal breath sounds.  Abdominal:     General: Bowel sounds are normal. There is no distension.     Palpations: Abdomen is soft. There is no mass.     Tenderness: There is no abdominal tenderness. There is no guarding.  Musculoskeletal:     Cervical back: Normal range of motion.  Skin:    General: Skin is warm and dry.  Neurological:     Mental Status: She is alert and oriented to person, place, and time.     Comments: Normal leg movement with provocation- withdrawals to uncomfortable stimulus normal DTRs  Psychiatric:        Behavior: Behavior normal.      ED Results / Procedures / Treatments   Labs (all labs ordered are listed, but only abnormal results are displayed) Labs Reviewed - No data to display  EKG None  Radiology No results found.  Procedures Procedures  Medications Ordered in ED Medications - No data to display  ED Course  I have reviewed the triage vital signs and the nursing notes.  Pertinent labs & imaging results that were available during my care of the patient were reviewed by me and considered in my medical decision making (see chart for details).    MDM Rules/Calculators/A&P                         .  Patient here with pain after being discharged from the hospital less than 12 hours ago and refusing to take her prescribed pain medicine.  She is demanding IV medications.  I have given her IV morphine.  I discussed her case with PA Costello who agrees that it is best to try to get her back  to the SNF with pain control.  She does not appear to have any acute or emergent symptoms or red flag symptoms is making urine.  We will check her urine for any signs of infection as she does have some  dysuria.  She is also complaining of constipation likely due to her opiate abuse.  UA shows infection. Treated here with ceftriaxone. Will dc with keflex. Discussed return precautions. Final Clinical Impression(s) / ED Diagnoses Final diagnoses:  Post-op pain  Acute cystitis without hematuria    Rx / DC Orders ED Discharge Orders    None       Margarita Mail, PA-C 09/02/20 2343    Margarita Mail, PA-C 09/02/20 2359    Sherwood Gambler, MD 09/04/20 1521

## 2020-09-02 NOTE — Progress Notes (Signed)
Ferne Reus PA at bedside, Pt unable to void this AM.  Per bladder scan Pt retaining 6262ml.  Per provider place foley cath.

## 2020-09-02 NOTE — TOC Transition Note (Signed)
Transition of Care [facility]) - CM/SW Discharge Note   Patient Details  Name: Kathryn [name] MRN: [id number] Date of Birth: Sep 06, 1970  Transition of [facility]) CM/SW Contact:  Emeterio Reeve, [place] Phone Number: 09/02/2020, 9:44 AM   Clinical Narrative:     Patient will DC to: [place] Anticipated DC date: 09/02/20 Family notified: pt will notify Transport by: Corey Harold     Per MD patient ready for DC to [place]. RN, patient, patient's family, and facility notified of DC. Discharge Summary and FL2 sent to facility. DC packet on chart. Ambulance transport requested for patient.    RN to call report to [phone].  CSW will sign off for now as social work intervention is no longer needed. Please consult [name] again if new needs arise.   Final next level of care: Skilled Nursing Facility Barriers to Discharge: Barriers Resolved   Patient Goals and CMS Choice Patient states their goals for this hospitalization and ongoing recovery are:: SNF for rehab   Choice offered to / list presented to : Patient  Discharge Placement              Patient chooses bed at: [facility] Patient to be transferred to facility by: ptar Name of family member notified: pt will notify Patient and family notified of of transfer: 09/02/20  Discharge Plan and Services                                     Social Determinants of Health (SDOH) Interventions     Readmission Risk Interventions No flowsheet data found.   Emeterio Reeve, Latanya Presser, [place] Social Worker [phone]

## 2020-09-02 NOTE — Discharge Instructions (Addendum)
I have changed your pain medication. YOU MUST TAKE YOUR PAIN MEDICATION WHEN IT IS OFFERED TO YOU. YOU WILL NOT BE ADMITTED BACK INTO THE HOSPITAL SIMPLY FOR PAIN CONTROL. Please follow up with your Neurosurgeon.  SEEK IMMEDIATE MEDICAL ATTENTION IF: New numbness, tingling, weakness, or problem with the use of your arms or legs.   Increasing pain in any areas of the body (such as chest or abdominal pain).  Shortness of breath, dizziness or fainting.  Nausea (feeling sick to your stomach), vomiting, fever, or sweats.

## 2020-09-02 NOTE — Progress Notes (Signed)
Patient discharged to [facility] via [facility], report given to nurse April.

## 2020-09-03 MED ORDER — MORPHINE SULFATE (PF) 4 MG/ML IV SOLN
INTRAVENOUS | Status: AC
  Administered 2020-09-03: 4 mg via INTRAVENOUS
  Filled 2020-09-03: qty 1

## 2020-09-03 NOTE — ED Notes (Signed)
Patient to [place] via [place], report called to [place], Therapist, sports at [employer]

## 2020-09-03 NOTE — ED Notes (Signed)
PTAR called  

## 2020-09-03 NOTE — ED Notes (Signed)
Pt discharged and wheeled out of the ED on a stretcher by ambulance crew.

## 2020-09-03 NOTE — ED Notes (Addendum)
Patient discharged in error, still awaiting [facility] for transport back to facility

## 2020-09-06 ENCOUNTER — Telehealth: Payer: Self-pay

## 2020-09-06 NOTE — Telephone Encounter (Signed)
Kathryn [name] from [place] in [place] letting us know the patient left against medical advice ph# [phone]

## 2020-09-06 NOTE — Telephone Encounter (Signed)
Kathryn [name] from [place] is also calling the police to do a wellness check on patient due to her having a major back surgery

## 2020-09-06 NOTE — Telephone Encounter (Signed)
error 

## 2020-09-10 ENCOUNTER — Other Ambulatory Visit: Payer: Self-pay | Admitting: Internal Medicine

## 2020-09-10 ENCOUNTER — Telehealth: Payer: Self-pay

## 2020-09-10 NOTE — Telephone Encounter (Signed)
Pt was prescribed lyrica twice a day under patel dr Gwyndolyn Saxon Kathryn [name] is trying to prescribe gabapentin for mental health the nurse at dr lays office is insisting that she be on both pharmacy thinks that there is no need for both medications. Please advise

## 2020-09-10 NOTE — Telephone Encounter (Signed)
Spoke with Erlene Quan at [facility] advised to D/C lyrica and fill gabapentin only. He does not want to fill both so he will only fill gabapentin.

## 2020-09-10 NOTE — Telephone Encounter (Signed)
Aaron Edelman from [place] left voice mail needs to speak to a nurse regarding patient. Call back # [phone].

## 2020-09-10 NOTE — Telephone Encounter (Signed)
Please advise them that we will stop filling Lyrica. She can continue taking Gabapentin. Have Pharmacy check with Dr Hoyle Barr if they are willing to fill both. Otherwise, she will continue only Gabapentin.

## 2020-09-11 ENCOUNTER — Encounter (HOSPITAL_COMMUNITY): Payer: Self-pay

## 2020-09-11 ENCOUNTER — Emergency Department (HOSPITAL_COMMUNITY): Payer: Medicaid Other

## 2020-09-11 ENCOUNTER — Other Ambulatory Visit: Payer: Self-pay

## 2020-09-11 ENCOUNTER — Emergency Department (HOSPITAL_COMMUNITY)
Admission: EM | Admit: 2020-09-11 | Discharge: 2020-09-11 | Disposition: A | Discharge status: Home / Self Care | Payer: Medicaid Other | Attending: Emergency Medicine | Admitting: Emergency Medicine

## 2020-09-11 DIAGNOSIS — I10 Essential (primary) hypertension: Secondary | ICD-10-CM | POA: Diagnosis not present

## 2020-09-11 DIAGNOSIS — J449 Chronic obstructive pulmonary disease, unspecified: Secondary | ICD-10-CM | POA: Diagnosis not present

## 2020-09-11 DIAGNOSIS — J4530 Mild persistent asthma, uncomplicated: Secondary | ICD-10-CM | POA: Insufficient documentation

## 2020-09-11 DIAGNOSIS — R609 Edema, unspecified: Secondary | ICD-10-CM

## 2020-09-11 DIAGNOSIS — R600 Localized edema: Secondary | ICD-10-CM | POA: Diagnosis not present

## 2020-09-11 DIAGNOSIS — I2699 Other pulmonary embolism without acute cor pulmonale: Secondary | ICD-10-CM | POA: Diagnosis not present

## 2020-09-11 DIAGNOSIS — I2693 Single subsegmental pulmonary embolism without acute cor pulmonale: Secondary | ICD-10-CM | POA: Diagnosis not present

## 2020-09-11 DIAGNOSIS — Z87891 Personal history of nicotine dependence: Secondary | ICD-10-CM | POA: Diagnosis not present

## 2020-09-11 DIAGNOSIS — K449 Diaphragmatic hernia without obstruction or gangrene: Secondary | ICD-10-CM | POA: Diagnosis not present

## 2020-09-11 LAB — CBC WITH DIFFERENTIAL/PLATELET
Abs Immature Granulocytes: 0.05 10*3/uL (ref 0.00–0.07)
Basophils Absolute: 0 10*3/uL (ref 0.0–0.1)
Basophils Relative: 0 %
Eosinophils Absolute: 0.1 10*3/uL (ref 0.0–0.5)
Eosinophils Relative: 1 %
HCT: 35.7 % — ABNORMAL LOW (ref 36.0–46.0)
Hemoglobin: 11.3 g/dL — ABNORMAL LOW (ref 12.0–15.0)
Immature Granulocytes: 1 %
Lymphocytes Relative: 27 %
Lymphs Abs: 2.1 10*3/uL (ref 0.7–4.0)
MCH: 28 pg (ref 26.0–34.0)
MCHC: 31.7 g/dL (ref 30.0–36.0)
MCV: 88.4 fL (ref 80.0–100.0)
Monocytes Absolute: 0.5 10*3/uL (ref 0.1–1.0)
Monocytes Relative: 6 %
Neutro Abs: 5.2 10*3/uL (ref 1.7–7.7)
Neutrophils Relative %: 65 %
Platelets: 783 10*3/uL — ABNORMAL HIGH (ref 150–400)
RBC: 4.04 MIL/uL (ref 3.87–5.11)
RDW: 14.2 % (ref 11.5–15.5)
WBC: 7.9 10*3/uL (ref 4.0–10.5)
nRBC: 0 % (ref 0.0–0.2)

## 2020-09-11 LAB — COMPREHENSIVE METABOLIC PANEL
ALT: 18 U/L (ref 0–44)
AST: 20 U/L (ref 15–41)
Albumin: 3.6 g/dL (ref 3.5–5.0)
Alkaline Phosphatase: 103 U/L (ref 38–126)
Anion gap: 7 (ref 5–15)
BUN: 12 mg/dL (ref 6–20)
CO2: 26 mmol/L (ref 22–32)
Calcium: 9.2 mg/dL (ref 8.9–10.3)
Chloride: 106 mmol/L (ref 98–111)
Creatinine, Ser: 0.78 mg/dL (ref 0.44–1.00)
GFR, Estimated: >60 mL/min (ref >60)
Glucose, Bld: 91 mg/dL (ref 70–99)
Potassium: 4.6 mmol/L (ref 3.5–5.1)
Sodium: 139 mmol/L (ref 135–145)
Total Bilirubin: 0.4 mg/dL (ref 0.3–1.2)
Total Protein: 7.8 g/dL (ref 6.5–8.1)

## 2020-09-11 LAB — TROPONIN I (HIGH SENSITIVITY)
Troponin I (High Sensitivity): <2 ng/L (ref <18)
Troponin I (High Sensitivity): 2 ng/L (ref <18)

## 2020-09-11 MED ORDER — IOHEXOL 350 MG/ML SOLN
75.0000 mL | Freq: Once | INTRAVENOUS | Status: AC | PRN
  Administered 2020-09-11: 75 mL via INTRAVENOUS

## 2020-09-11 NOTE — ED Provider Notes (Signed)
Emergency Department Provider Note   I have reviewed the triage vital signs and the nursing notes.   HISTORY  Chief Complaint Leg Swelling   HPI Kathryn [name] is a 50 y.o. 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  She has trouble moving her toes because of swelling and has continued pain.  She noticed several days ago she has a tightness in her left chest worse with breathing.  She does endorse some mild dyspnea.  She did not initially have chest pain symptoms.  No fevers or chills.  No abdominal discomfort.  She has some burning sensation over the incision in her back but that has been since the surgery and not significantly worsening. She has been compliant with medication.   Past Medical History:  Diagnosis Date  . Anxiety   . Arthritis   . Asthma   . Bipolar disorder ([place])   . Chronic back pain   . COPD (chronic obstructive pulmonary disease) (HCC)    Chronic bronchitis  . Depression   . GERD (gastroesophageal reflux disease)   . Hypertension   . Neuropathy   . Osteoarthritis of left knee, 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  . Sleep apnea 04/2020   GETTING A cpap  . Suicidal ideation 12/24/2018    Patient Active Problem List   Diagnosis Date Noted  . Lumbar spinal stenosis 08/20/2020  . Insomnia 03/19/2020  . Mild persistent asthma without complication 38/75/6433  . Need for immunization against influenza 03/19/2020  . Obesity (BMI 30-39.9) 03/19/2020  . GERD (gastroesophageal reflux disease) 03/19/2020  . Intermittent palpitations 12/17/2019  . Snoring 12/17/2019  . Excessive daytime sleepiness 12/17/2019  . Choking 12/17/2019  . 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 Parasomnia 12/17/2019  . Enlarged uterus 02/20/2018  . Screening for colorectal cancer 02/20/2018  . Routine cervical smear 02/20/2018   . Encounter to establish care 02/20/2018  . Osteoarthritis of left knee, 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  . MDD (major depressive disorder), recurrent episode, severe ([place]) 12/24/2017  . Lumbar back pain 05/11/2017  . Low vitamin B12 level 05/11/2017  . Primary hypertension 01/11/2017    Past Surgical History:  Procedure Laterality Date  . DILATATION AND CURETTAGE/HYSTEROSCOPY WITH MINERVA N/A 06/09/2020   Procedure: DILATATION AND CURETTAGE/HYSTEROSCOPY WITH MINERVA;  Surgeon: Florian Buff, MD;  Location: [facility];  Service: Gynecology;  Laterality: N/A;  . ESOPHAGOGASTRODUODENOSCOPY (EGD) WITH PROPOFOL N/A 12/25/2018   Procedure: ESOPHAGOGASTRODUODENOSCOPY (EGD) WITH PROPOFOL;  Surgeon: Rogene Houston, MD;  Location: [facility];  Service: Endoscopy;  Laterality: N/A;  . PATELLA-FEMORAL ARTHROPLASTY Left 10/08/2018   Procedure: PATELLA-FEMORAL ARTHROPLASTY;  Surgeon: Marchia Bond, MD;  Location: [facility];  Service: Orthopedics;  Laterality: Left;  . TUBAL LIGATION      Allergies Clonopin [clonazepam], Fish allergy, Flexeril [cyclobenzaprine hcl], Ibuprofen, Shellfish allergy, Tylenol [acetaminophen], Ace inhibitors, Cyclobenzaprine, Tramadol, and Trazodone and nefazodone  Family History  Problem Relation Age of Onset  . Gout Paternal Grandfather   . Cirrhosis Paternal Grandfather   . Hypertension Paternal Grandmother   . Aneurysm Paternal Grandmother   . Cirrhosis Maternal Grandmother   . Cirrhosis Maternal Grandfather   . Cancer Father   . Cirrhosis Father   . Cirrhosis Mother   . Breast cancer Sister   . Hypertension Sister   . Bronchitis Daughter   . Bronchitis Daughter   . Asthma Son   .  Bronchitis Son   . Migraines Neg Hx     Social History Social History   Tobacco Use  . Smoking status: Former Smoker    Types: Cigarettes    Quit date: 2016    Years since quitting: 6.4  . Smokeless tobacco: Never Used  . Tobacco comment: wears nicotine patches  Vaping Use  .  Vaping Use: Never used  Substance Use Topics  . Alcohol use: Not Currently  . Drug use: Not Currently    Types: Cocaine    Comment: crack  last used 2016    Review of Systems  Constitutional: No fever/chills Eyes: No visual changes. ENT: No sore throat. Cardiovascular: Positive chest pain. Respiratory: Positive shortness of breath. Gastrointestinal: No abdominal pain.  No nausea, no vomiting.  No diarrhea.  No constipation. Genitourinary: Negative for dysuria. Musculoskeletal: Positive for back pain. Positive bilateral leg pain and swelling.  Skin: Negative for rash. Neurological: Negative for headaches, focal weakness or numbness.  10-point ROS otherwise negative.  ____________________________________________   PHYSICAL EXAM:  VITAL SIGNS: ED Triage Vitals  Enc Vitals Group     BP 09/11/20 0811 129/87     Pulse Rate 09/11/20 0811 75     Resp 09/11/20 0811 18     Temp 09/11/20 0811 98.6 F (37 C)     Temp Source 09/11/20 0811 Oral     SpO2 09/11/20 0811 99 %     Weight 09/11/20 0812 135 lb (61.2 kg)     Height 09/11/20 0812 4\' 11"  (1.499 m)   Constitutional: Alert and oriented. Well appearing and in no acute distress. Eyes: Conjunctivae are normal.  Head: Atraumatic. Nose: No congestion/rhinnorhea. Mouth/Throat: Mucous membranes are moist.  Neck: No stridor.  Cardiovascular: Normal rate, regular rhythm. Good peripheral circulation. Grossly normal heart sounds.   Respiratory: Normal respiratory effort.  No retractions. Lungs CTAB. Gastrointestinal: Soft and nontender. No distention.  Musculoskeletal: No lower extremity tenderness with trace bilateral edema. No gross deformities of extremities. Neurologic:  Normal speech and language. No gross focal neurologic deficits are appreciated.  Skin:  Skin is warm, dry and intact. No rash noted.   ____________________________________________   LABS (all labs ordered are listed, but only abnormal results are  displayed)  Labs Reviewed  CBC WITH DIFFERENTIAL/PLATELET - Abnormal; Notable for the following components:      Result Value   Hemoglobin 11.3 (*)    HCT 35.7 (*)    Platelets 783 (*)    All other components within normal limits  COMPREHENSIVE METABOLIC PANEL  TROPONIN I (HIGH SENSITIVITY)  TROPONIN I (HIGH SENSITIVITY)   ____________________________________________  EKG   EKG Interpretation  Date/Time:  Saturday Sep 11 2020 08:48:22 EDT Ventricular Rate:  71 PR Interval:  160 QRS Duration: 94 QT Interval:  376 QTC Calculation: 409 R Axis:   89 Text Interpretation: Sinus rhythm Similar to prior. Confirmed by Nanda Quinton [phone]) on 09/11/2020 8:59:05 AM       ____________________________________________  RADIOLOGY  CTA PE reviewed.  ____________________________________________   PROCEDURES  Procedure(s) performed:   Procedures  None ____________________________________________   INITIAL IMPRESSION / ASSESSMENT AND PLAN / ED COURSE  Pertinent labs & imaging results that were available during my care of the patient were reviewed by me and considered in my medical decision making (see chart for details).   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  No hypoxemia or tachycardia.  She is  mainly complaining of continued pain and swelling in the legs.  No evidence of cellulitis or septic joint.  Lungs are clear.  Plan for CT angio PE study given new pleuritic chest pain with known DVT despite anticoagulation.   CTA shows a tiny, nonocclusive PE.  No radiographic evidence of right heart strain.  Patient's vital signs are stable.  Recently started on anticoagulation so do not consider this a failure of Eliquis.  Plan to continue this medication and follow closely with the PCP.  She is no longer having chest pain.  Labs reviewed.  No acute findings. ____________________________________________  FINAL  CLINICAL IMPRESSION(S) / ED DIAGNOSES  Final diagnoses:  Peripheral edema  Single subsegmental pulmonary embolism without acute cor pulmonale (HCC)     MEDICATIONS GIVEN DURING THIS VISIT:  Medications  iohexol (OMNIPAQUE) 350 MG/ML injection 75 mL (75 mLs Intravenous Contrast Given 09/11/20 1003)    Note:  This document was prepared using Dragon voice recognition software and may include unintentional dictation errors.  Nanda Quinton, MD, [facility] Emergency Medicine    Keith Cancio, [facility], MD 09/13/20 [phone]

## 2020-09-11 NOTE — ED Notes (Signed)
Pt on 5 lead monitor

## 2020-09-11 NOTE — ED Triage Notes (Signed)
Pt to er, pt states that she had surgery on her back on may 6th, states that she is here today because she is having pain and swelling in her lower extremities, states that she has been dx with blood clots and is currently on elequis, states that it doesn't seem to be helping, states that she has pain and swelling.

## 2020-09-11 NOTE — Discharge Instructions (Signed)
You were seen in the emergency department today with swelling in the legs and pain in the chest.  You do have a very small, nonocclusive pulmonary embolism.  Please stay on your blood thinners.  I have listed the name of a vascular surgeon.  Please call to schedule an appointment to discuss your pain from your blood clots in the legs.  If you develop worsening chest pain or shortness of breath please return to the emergency department.

## 2020-09-13 ENCOUNTER — Telehealth: Payer: Self-pay

## 2020-09-13 NOTE — Telephone Encounter (Signed)
Transition Care Management Follow-up Telephone Call  Date of discharge and from where: 09/11/20 from [facility]  How have you been since you were released from the hospital? Pt states that she feels okay but she is having dizzy spells and coughing. Pt stated that she coughed up something. Pt states that her back is sore from the fall on Sunday.   Any questions or concerns? No  Items Reviewed:  Did the pt receive and understand the discharge instructions provided? Yes   Medications obtained and verified? Yes   Other? No   Any new allergies since your discharge? No   Dietary orders reviewed? heart  Do you have support at home? Yes   Functional Questionnaire: (I = Independent and D = Dependent) ADLs: D due to dizziness and recent falls  Follow up appointments reviewed:   PCP Hospital f/u appt confirmed? Yes  Scheduled to see Ihor Dow, MD on 09/14/2020 @ 8:40am.  [facility] f/u appt confirmed? Yes  Scheduled to see Larey Seat, MD on 10/02/20 @ 8:30am.  Are transportation arrangements needed? No   If their condition worsens, is the pt aware to call PCP or go to the Emergency Dept.? Yes  Was the patient provided with contact information for the PCP's office or ED? Yes  Was to pt encouraged to call back with questions or concerns? Yes

## 2020-09-14 ENCOUNTER — Other Ambulatory Visit: Payer: Self-pay

## 2020-09-14 ENCOUNTER — Ambulatory Visit: Payer: Medicaid Other | Admitting: Internal Medicine

## 2020-09-14 ENCOUNTER — Encounter: Payer: Self-pay | Admitting: Internal Medicine

## 2020-09-14 VITALS — BP 118/82 | HR 90 | Resp 18 | Ht 59.0 in

## 2020-09-14 DIAGNOSIS — I10 Essential (primary) hypertension: Secondary | ICD-10-CM

## 2020-09-14 DIAGNOSIS — R269 Unspecified abnormalities of gait and mobility: Secondary | ICD-10-CM

## 2020-09-14 DIAGNOSIS — Z86718 Personal history of other venous thrombosis and embolism: Secondary | ICD-10-CM | POA: Insufficient documentation

## 2020-09-14 DIAGNOSIS — I824Y9 Acute embolism and thrombosis of unspecified deep veins of unspecified proximal lower extremity: Secondary | ICD-10-CM

## 2020-09-14 DIAGNOSIS — Z981 Arthrodesis status: Secondary | ICD-10-CM | POA: Diagnosis not present

## 2020-09-14 DIAGNOSIS — J4530 Mild persistent asthma, uncomplicated: Secondary | ICD-10-CM

## 2020-09-14 DIAGNOSIS — K219 Gastro-esophageal reflux disease without esophagitis: Secondary | ICD-10-CM | POA: Diagnosis not present

## 2020-09-14 NOTE — Patient Instructions (Signed)
Please continue to take medications as prescribed for now.  Please follow up with Spine surgeon as scheduled.  Please follow up with Vascular surgeon as scheduled.

## 2020-09-15 DIAGNOSIS — M48062 Spinal stenosis, lumbar region with neurogenic claudication: Secondary | ICD-10-CM | POA: Diagnosis not present

## 2020-09-16 ENCOUNTER — Telehealth: Payer: Self-pay

## 2020-09-16 NOTE — Telephone Encounter (Signed)
Vaughan Basta from [facility] called said not in network with [facility] for physical therapy. Call back # [phone]

## 2020-09-16 NOTE — Progress Notes (Signed)
Established Patient Office Visit  Subjective:  Patient ID: Kathryn [name], female    DOB: 1970-05-06  Age: 50 y.o. MRN: [id number]  CC:  Chief Complaint  Patient presents with  . Follow-up    4 month follow up pt has blood clots in each leg and lungs has vascular surgery appt however this is too far out she also would like home health referral and she has fell at home so would like PT     HPI Kathryn [name] is a 50 year old female with past medical history of hypertension, asthma, GERD, insomnia, mild sleep apnea, depression, chronic low back pain and knee pain, and obesity who presents for follow up after recent surgery and evaluation of persistent back pain.  She recently had posterior lumbar interbody fusion (L3-L4). She had b/l DVT in the post-op period and has been taking Eliquis currently. She also developed PE. She currently denies dyspnea, chest pain or palpitations. She has been having difficulty walking since the surgery. She has not had PT yet. She has been having difficulty with her ADLs due to severe back pain and walking difficulty.  She asks whether she can be referred to Vascular surgery for b/l DVT and PE. I discussed possible treatment for DVT.   Past Medical History:  Diagnosis Date  . Anxiety   . Arthritis   . Asthma   . Bipolar disorder ([place])   . Chronic back pain   . COPD (chronic obstructive pulmonary disease) (HCC)    Chronic bronchitis  . Depression   . GERD (gastroesophageal reflux disease)   . Hypertension   . Neuropathy   . Osteoarthritis of left knee, 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  . Sleep apnea 04/2020   GETTING A cpap  . Suicidal ideation 12/24/2018    Past Surgical History:  Procedure Laterality Date  . DILATATION AND CURETTAGE/HYSTEROSCOPY WITH MINERVA N/A 06/09/2020   Procedure: DILATATION AND CURETTAGE/HYSTEROSCOPY WITH MINERVA;  Surgeon: Florian Buff, MD;  Location: [facility];  Service: Gynecology;  Laterality: N/A;  .  ESOPHAGOGASTRODUODENOSCOPY (EGD) WITH PROPOFOL N/A 12/25/2018   Procedure: ESOPHAGOGASTRODUODENOSCOPY (EGD) WITH PROPOFOL;  Surgeon: Rogene Houston, MD;  [facility];  Service: Endoscopy;  Laterality: N/A;  . PATELLA-FEMORAL ARTHROPLASTY Left 10/08/2018   Procedure: PATELLA-FEMORAL ARTHROPLASTY;  Surgeon: Marchia Bond, MD;  Location: [facility];  Service: Orthopedics;  Laterality: Left;  . TUBAL LIGATION      Family History  Problem Relation Age of Onset  . Gout Paternal Grandfather   . Cirrhosis Paternal Grandfather   . Hypertension Paternal Grandmother   . Aneurysm Paternal Grandmother   . Cirrhosis Maternal Grandmother   . Cirrhosis Maternal Grandfather   . Cancer Father   . Cirrhosis Father   . Cirrhosis Mother   . Breast cancer Sister   . Hypertension Sister   . Bronchitis Daughter   . Bronchitis Daughter   . Asthma Son   . Bronchitis Son   . Migraines Neg Hx     Social History   Socioeconomic History  . Marital status: Single    Spouse name: Not on file  . Number of children: Not on file  . Years of education: Not on file  . Highest education level: Not on file  Occupational History  . Not on file  Tobacco Use  . Smoking status: Former Smoker    Types: Cigarettes    Quit date: 2016    Years since quitting: 6.4  . Smokeless tobacco: Never Used  . Tobacco  comment: wears nicotine patches  Vaping Use  . Vaping Use: Never used  Substance and Sexual Activity  . Alcohol use: Not Currently  . Drug use: Not Currently    Types: Cocaine    Comment: crack  last used 2016  . Sexual activity: Yes    Birth control/protection: Surgical    Comment: tubal  Other Topics Concern  . Not on file  Social History Narrative   R handed    Lives with boyfriend   1 Cup of caffeine daily    Social Determinants of Health   Financial Resource Strain: Not on file  Food Insecurity: Not on file  Transportation Needs: Not on file  Physical Activity: Not on file  Stress:  Not on file  Social Connections: Not on file  Intimate Partner Violence: Not on file    Outpatient Medications Prior to Visit  Medication Sig Dispense Refill  . albuterol (PROVENTIL HFA;VENTOLIN HFA) 108 (90 Base) MCG/ACT inhaler Inhale 2 puffs into the lungs every 6 (six) hours as needed for wheezing or shortness of breath.    Marland Kitchen albuterol (PROVENTIL) (2.5 MG/3ML) 0.083% nebulizer solution Take 2.5 mg by nebulization every 6 (six) hours as needed for wheezing or shortness of breath.    Marland Kitchen amLODipine (NORVASC) 5 MG tablet Take 5 mg by mouth daily.    Marland Kitchen apixaban (ELIQUIS) 5 MG TABS tablet Take 2 tablets (10 mg total) by mouth 2 (two) times daily. 60 tablet   . apixaban (ELIQUIS) 5 MG TABS tablet Take 1 tablet (5 mg total) by mouth 2 (two) times daily. 60 tablet   . Boric Acid Vaginal 600 MG SUPP Place 600 mg vaginally as directed. Place one capsule inside the vagina at bedtime every other night for 3 doses. Use once a week at bedtime thereafter for foul odor. 30 suppository 0  . budesonide-formoterol (SYMBICORT) 80-4.5 MCG/ACT inhaler Inhale 2 puffs into the lungs 2 (two) times daily. 1 each 5  . celecoxib (CELEBREX) 200 MG capsule Take 1 capsule (200 mg total) by mouth 2 (two) times daily. For pain 20 capsule 0  . citalopram (CELEXA) 20 MG tablet Take 20 mg by mouth daily.    . cloNIDine (CATAPRES) 0.1 MG tablet TAKE 1 TABLET BY MOUTH ONCE DAILY FOR BLOOD PRESSURE AND ANXIETY. 90 tablet 0  . hydrOXYzine (VISTARIL) 50 MG capsule Take 1 capsule (50 mg total) by mouth daily as needed for anxiety (Insomnia). (Patient taking differently: Take 50 mg by mouth at bedtime.) 30 capsule 5  . methocarbamol (ROBAXIN) 500 MG tablet Take 500 mg by mouth in the morning and at bedtime.    Marland Kitchen morphine (MSIR) 30 MG tablet Take 1 tablet (30 mg total) by mouth every 4 (four) hours as needed for severe pain. 30 tablet 0  . omeprazole (PRILOSEC) 20 MG capsule Take 1 capsule (20 mg total) by mouth daily. 90 capsule 0  .  ondansetron (ZOFRAN ODT) 4 MG disintegrating tablet Take 1 tablet (4 mg total) by mouth every 8 (eight) hours as needed for nausea or vomiting. 20 tablet 0  . ondansetron (ZOFRAN ODT) 8 MG disintegrating tablet Take 1 tablet (8 mg total) by mouth every 8 (eight) hours as needed for nausea or vomiting. 8 tablet 0  . oxyCODONE-acetaminophen (PERCOCET/ROXICET) 5-325 MG tablet Take 1 tablet by mouth every 4 (four) hours as needed.    . pregabalin (LYRICA) 75 MG capsule Take 1 capsule (75 mg total) by mouth 2 (two) times daily. 60 capsule 5  .  Relugolix-Estradiol-Norethind (MYFEMBREE) 40-1-0.5 MG TABS Take 1 tablet by mouth daily. 30 tablet 11  . tiZANidine (ZANAFLEX) 2 MG tablet TAKE 1 TABLET BY MOUTH TWICE DAILY AS NEEDED FOR MUSCLE SPASMS 30 tablet 0  . torsemide (DEMADEX) 5 MG tablet Take 1 tablet (5 mg total) by mouth daily. 30 tablet 0  . Vitamin D, Ergocalciferol, (DRISDOL) 1.25 MG (50000 UNIT) CAPS capsule Take 50,000 Units by mouth every 7 (seven) days.    . polyethylene glycol (MIRALAX / GLYCOLAX) 17 g packet Take 17 g by mouth 3 (three) times daily as needed. (Patient not taking: Reported on 09/14/2020) 14 each 0   No facility-administered medications prior to visit.    Allergies  Allergen Reactions  . Clonopin [Clonazepam] Anaphylaxis    Swelling of the tongue and mouth.   . Fish Allergy Anaphylaxis, Shortness Of Breath and Swelling  . Flexeril [Cyclobenzaprine Hcl] Shortness Of Breath  . Ibuprofen Anaphylaxis, Hives and Other (See Comments)  . Shellfish Allergy Anaphylaxis  . Tylenol [Acetaminophen] Anaphylaxis  . Ace Inhibitors Cough  . Cyclobenzaprine   . Tramadol Nausea And Vomiting    Upset stomach  . Trazodone And Nefazodone Hives    ROS Review of Systems  Constitutional: Negative for chills and fever.  HENT: Negative for congestion, sinus pressure, sinus pain and sore throat.   Eyes: Negative for pain and discharge.  Respiratory: Negative for cough and shortness of  breath.   Cardiovascular: Negative for chest pain and palpitations.  Gastrointestinal: Negative for abdominal pain, constipation, diarrhea, nausea and vomiting.  Endocrine: Negative for polydipsia and polyuria.  Genitourinary: Positive for pelvic pain. Negative for dysuria, hematuria, menstrual problem, vaginal bleeding and vaginal discharge.  Musculoskeletal: Positive for arthralgias and back pain. Negative for neck pain and neck stiffness.  Skin: Negative for rash.  Neurological: Negative for dizziness and weakness.  Psychiatric/Behavioral: Positive for dysphoric mood and sleep disturbance. Negative for agitation and behavioral problems.      Objective:    Physical Exam Vitals reviewed.  Constitutional:      General: She is not in acute distress.    Appearance: She is obese. She is not diaphoretic.     Comments: In wheelchair  HENT:     Head: Normocephalic and atraumatic.     Nose: Nose normal.     Mouth/Throat:     Mouth: Mucous membranes are moist.  Eyes:     General: No scleral icterus.    Extraocular Movements: Extraocular movements intact.  Cardiovascular:     Rate and Rhythm: Normal rate and regular rhythm.     Pulses: Normal pulses.     Heart sounds: Normal heart sounds. No murmur heard.   Pulmonary:     Breath sounds: Normal breath sounds. No wheezing or rales.  Abdominal:     Palpations: Abdomen is soft.     Tenderness: There is no abdominal tenderness. There is no guarding or rebound.  Musculoskeletal:     Cervical back: Neck supple. No tenderness.     Right lower leg: No edema.     Left lower leg: No edema.  Skin:    General: Skin is warm.     Findings: No rash.  Neurological:     General: No focal deficit present.     Mental Status: She is alert and oriented to person, place, and time.     Sensory: No sensory deficit.     Motor: No weakness.  Psychiatric:        Mood and Affect:  Mood normal.        Behavior: Behavior normal.     BP 118/82 (BP  Location: Left Arm, Patient Position: Sitting, Cuff Size: Normal)   Pulse 90   Resp 18   Ht 4\' 11"  (1.499 m)   LMP 06/07/2020   SpO2 99%   BMI 27.27 kg/m  Wt Readings from Last 3 Encounters:  09/11/20 135 lb (61.2 kg)  09/02/20 185 lb 3 oz (84 kg)  08/20/20 187 lb (84.8 kg)     Health Maintenance Due  Topic Date Due  . COLONOSCOPY (Pts 45-41yrs Insurance coverage will need to be confirmed)  Never done  . Zoster Vaccines- Shingrix (1 of 2) Never done  . COVID-19 Vaccine (3 - Booster for Moderna series) 03/29/2020    There are no preventive care reminders to display for this patient.  Lab Results  Component Value Date   TSH 1.533 12/24/2018   Lab Results  Component Value Date   WBC 7.9 09/11/2020   HGB 11.3 (L) 09/11/2020   HCT 35.7 (L) 09/11/2020   MCV 88.4 09/11/2020   PLT 783 (H) 09/11/2020   Lab Results  Component Value Date   NA 139 09/11/2020   K 4.6 09/11/2020   CO2 26 09/11/2020   GLUCOSE 91 09/11/2020   BUN 12 09/11/2020   CREATININE 0.78 09/11/2020   BILITOT 0.4 09/11/2020   ALKPHOS 103 09/11/2020   AST 20 09/11/2020   ALT 18 09/11/2020   PROT 7.8 09/11/2020   ALBUMIN 3.6 09/11/2020   CALCIUM 9.2 09/11/2020   ANIONGAP 7 09/11/2020   Lab Results  Component Value Date   CHOL 155 04/20/2017   Lab Results  Component Value Date   HDL 70 04/20/2017   Lab Results  Component Value Date   LDLCALC 72 04/20/2017   Lab Results  Component Value Date   TRIG 46 04/20/2017   Lab Results  Component Value Date   CHOLHDL 2.2 04/20/2017   Lab Results  Component Value Date   HGBA1C 5.4 04/20/2017      Assessment & Plan:   Problem List Items Addressed This Visit      Cardiovascular and Mediastinum   Primary hypertension - Primary    BP Readings from Last 1 Encounters:  09/14/20 118/82   Well-controlled Counseled for compliance with the medications Advised DASH diet and moderate exercise/walking, at least 150 mins/week      Acute deep vein  thrombosis (DVT) of proximal vein of lower extremity (HCC)    On Eliquis 5 mg BID Referred to Vascular surgery as per patient request      Relevant Orders   Ambulatory referral to Vascular Surgery     Respiratory   Mild persistent asthma without complication    On Symbicort and Albuterol (PRN) Advised to use Albuterol only as needed as rescue inhaler.        Digestive   GERD (gastroesophageal reflux disease)    On Protonix        Other   S/P lumbar fusion    H/o chronic low back pain S/p lumbar fusion surgery - L3-L4 F/u with Spine surgery Referred to home health due to difficulty ambulating and unable to perform ADLs - skilled nursing and PT would help.      Relevant Orders   Ambulatory referral to [place]    Other Visit Diagnoses    Gait abnormality       Relevant Orders   Ambulatory referral to [place]  No orders of the defined types were placed in this encounter.   Follow-up: Return in about 6 weeks (around 10/26/2020) for Medication management.    Lindell Spar, MD

## 2020-09-16 NOTE — Telephone Encounter (Signed)
No need to call back will resend to diff home health

## 2020-09-16 NOTE — Assessment & Plan Note (Signed)
BP Readings from Last 1 Encounters:  09/14/20 118/82   Well-controlled Counseled for compliance with the medications Advised DASH diet and moderate exercise/walking, at least 150 mins/week

## 2020-09-16 NOTE — Assessment & Plan Note (Signed)
On Eliquis 5 mg BID Referred to Vascular surgery as per patient request

## 2020-09-16 NOTE — Assessment & Plan Note (Signed)
On Symbicort and Albuterol (PRN) Advised to use Albuterol only as needed as rescue inhaler.

## 2020-09-16 NOTE — Assessment & Plan Note (Signed)
On Protonix 

## 2020-09-16 NOTE — Assessment & Plan Note (Signed)
H/o chronic low back pain S/p lumbar fusion surgery - L3-L4 F/u with Spine surgery Referred to home health due to difficulty ambulating and unable to perform ADLs - skilled nursing and PT would help.

## 2020-09-23 ENCOUNTER — Ambulatory Visit: Payer: Medicaid Other | Admitting: Vascular Surgery

## 2020-09-23 ENCOUNTER — Other Ambulatory Visit: Payer: Self-pay

## 2020-09-23 ENCOUNTER — Encounter: Payer: Self-pay | Admitting: Vascular Surgery

## 2020-09-23 VITALS — BP 100/71 | HR 78 | Temp 97.7°F | Resp 20 | Ht 59.0 in | Wt 187.0 lb

## 2020-09-23 DIAGNOSIS — I82403 Acute embolism and thrombosis of unspecified deep veins of lower extremity, bilateral: Secondary | ICD-10-CM | POA: Diagnosis not present

## 2020-09-23 NOTE — Progress Notes (Signed)
Referring Physician: Dr. Agnes Lawrence  Patient name: Kathryn [name] MRN: [id number] DOB: [birth year] Sex: female  REASON FOR CONSULT: Calf vein DVT with pulmonary embolus  HPI: Kathryn [name] is a 50 y.o. female, who in May of this year had a pulmonary embolus after spine surgery.  She was noted on duplex scan also have bilateral calf vein DVT.  She states she still has some swelling in her legs.  She also complains that she has some shortness of breath but thinks that all of this has improved somewhat.  She has been on Eliquis since May of this year.  Has not had any prior episodes prior to this.  Not have family history of hypercoagulable state.  Other medical problems include chronic back pain, bipolar disorder, COPD, hypertension, neuropathy, sleep apnea all of which have been stable.  Past Medical History:  Diagnosis Date   Anxiety    Arthritis    Asthma    Bipolar disorder ([place])    Chronic back pain    COPD (chronic obstructive pulmonary disease) (HCC)    Chronic bronchitis   Depression    GERD (gastroesophageal reflux disease)    Hypertension    Neuropathy    Osteoarthritis of left knee, 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   Sleep apnea 04/2020   GETTING A cpap   Suicidal ideation 12/24/2018   Past Surgical History:  Procedure Laterality Date   BACK SURGERY     DILATATION AND CURETTAGE/HYSTEROSCOPY WITH MINERVA N/A 06/09/2020   Procedure: DILATATION AND CURETTAGE/HYSTEROSCOPY WITH MINERVA;  Surgeon: Florian Buff, MD;  Location: [facility];  Service: Gynecology;  Laterality: N/A;   ESOPHAGOGASTRODUODENOSCOPY (EGD) WITH PROPOFOL N/A 12/25/2018   Procedure: ESOPHAGOGASTRODUODENOSCOPY (EGD) WITH PROPOFOL;  Surgeon: Rogene Houston, MD;  Location: [facility];  Service: Endoscopy;  Laterality: N/A;   PATELLA-FEMORAL ARTHROPLASTY Left 10/08/2018   Procedure: PATELLA-FEMORAL ARTHROPLASTY;  Surgeon: Marchia Bond, MD;  Location: [facility];  Service: Orthopedics;  Laterality: Left;   TUBAL  LIGATION      Family History  Problem Relation Age of Onset   Gout Paternal Grandfather    Cirrhosis Paternal Grandfather    Hypertension Paternal Grandmother    Aneurysm Paternal Grandmother    Cirrhosis Maternal Grandmother    Cirrhosis Maternal Grandfather    Cancer Father    Cirrhosis Father    Cirrhosis Mother    Breast cancer Sister    Hypertension Sister    Bronchitis Daughter    Bronchitis Daughter    Asthma Son    Bronchitis Son    Migraines Neg Hx     SOCIAL HISTORY: Social History   Socioeconomic History   Marital status: Single    Spouse name: Not on file   Number of children: Not on file   Years of education: Not on file   Highest education level: Not on file  Occupational History   Not on file  Tobacco Use   Smoking status: Former    Pack years: 0.00    Types: Cigarettes    Quit date: 2016    Years since quitting: 6.4   Smokeless tobacco: Never   Tobacco comments:    wears nicotine patches  Vaping Use   Vaping Use: Never used  Substance and Sexual Activity   Alcohol use: Not Currently   Drug use: Not Currently    Types: Cocaine    Comment: crack  last used 2016   Sexual activity: Yes    Birth control/protection: Surgical  Comment: tubal  Other Topics Concern   Not on file  Social History Narrative   R handed    Lives with boyfriend   1 Cup of caffeine daily    Social Determinants of Health   Financial Resource Strain: Not on file  Food Insecurity: Not on file  Transportation Needs: Not on file  Physical Activity: Not on file  Stress: Not on file  Social Connections: Not on file  Intimate Partner Violence: Not on file    Allergies  Allergen Reactions   Clonopin [Clonazepam] Anaphylaxis    Swelling of the tongue and mouth.    Fish Allergy Anaphylaxis, Shortness Of Breath and Swelling   Flexeril [Cyclobenzaprine Hcl] Shortness Of Breath   Ibuprofen Anaphylaxis, Hives and Other (See Comments)   Shellfish Allergy Anaphylaxis    Tylenol [Acetaminophen] Anaphylaxis   Ace Inhibitors Cough   Cyclobenzaprine    Tramadol Nausea And Vomiting    Upset stomach   Trazodone And Nefazodone Hives    Current Outpatient Medications  Medication Sig Dispense Refill   albuterol (PROVENTIL HFA;VENTOLIN HFA) 108 (90 Base) MCG/ACT inhaler Inhale 2 puffs into the lungs every 6 (six) hours as needed for wheezing or shortness of breath.     albuterol (PROVENTIL) (2.5 MG/3ML) 0.083% nebulizer solution Take 2.5 mg by nebulization every 6 (six) hours as needed for wheezing or shortness of breath.     amLODipine (NORVASC) 5 MG tablet Take 5 mg by mouth daily.     apixaban (ELIQUIS) 5 MG TABS tablet Take 1 tablet (5 mg total) by mouth 2 (two) times daily. 60 tablet    Boric Acid Vaginal 600 MG SUPP Place 600 mg vaginally as directed. Place one capsule inside the vagina at bedtime every other night for 3 doses. Use once a week at bedtime thereafter for foul odor. 30 suppository 0   budesonide-formoterol (SYMBICORT) 80-4.5 MCG/ACT inhaler Inhale 2 puffs into the lungs 2 (two) times daily. 1 each 5   celecoxib (CELEBREX) 200 MG capsule Take 1 capsule (200 mg total) by mouth 2 (two) times daily. For pain 20 capsule 0   citalopram (CELEXA) 20 MG tablet Take 20 mg by mouth daily.     cloNIDine (CATAPRES) 0.1 MG tablet TAKE 1 TABLET BY MOUTH ONCE DAILY FOR BLOOD PRESSURE AND ANXIETY. 90 tablet 0   hydrOXYzine (VISTARIL) 50 MG capsule Take 1 capsule (50 mg total) by mouth daily as needed for anxiety (Insomnia). (Patient taking differently: Take 50 mg by mouth at bedtime.) 30 capsule 5   methocarbamol (ROBAXIN) 500 MG tablet Take 500 mg by mouth in the morning and at bedtime.     morphine (MSIR) 30 MG tablet Take 1 tablet (30 mg total) by mouth every 4 (four) hours as needed for severe pain. 30 tablet 0   omeprazole (PRILOSEC) 20 MG capsule Take 1 capsule (20 mg total) by mouth daily. 90 capsule 0   ondansetron (ZOFRAN ODT) 4 MG disintegrating tablet  Take 1 tablet (4 mg total) by mouth every 8 (eight) hours as needed for nausea or vomiting. 20 tablet 0   ondansetron (ZOFRAN ODT) 8 MG disintegrating tablet Take 1 tablet (8 mg total) by mouth every 8 (eight) hours as needed for nausea or vomiting. 8 tablet 0   oxyCODONE-acetaminophen (PERCOCET/ROXICET) 5-325 MG tablet Take 1 tablet by mouth every 4 (four) hours as needed.     pregabalin (LYRICA) 75 MG capsule Take 1 capsule (75 mg total) by mouth 2 (two) times daily. [place]  capsule 5   Relugolix-Estradiol-Norethind (MYFEMBREE) 40-1-0.5 MG TABS Take 1 tablet by mouth daily. 30 tablet 11   tiZANidine (ZANAFLEX) 2 MG tablet TAKE 1 TABLET BY MOUTH TWICE DAILY AS NEEDED FOR MUSCLE SPASMS 30 tablet 0   torsemide (DEMADEX) 5 MG tablet Take 1 tablet (5 mg total) by mouth daily. 30 tablet 0   Vitamin D, Ergocalciferol, (DRISDOL) 1.25 MG (50000 UNIT) CAPS capsule Take 50,000 Units by mouth every 7 (seven) days.     No current facility-administered medications for this visit.    ROS:   General:  No weight loss, Fever, chills  HEENT: No recent headaches, no nasal bleeding, no visual changes, no sore throat  Neurologic: No dizziness, blackouts, seizures. No recent symptoms of stroke or mini- stroke. No recent episodes of slurred speech, or temporary blindness.  Cardiac: No recent episodes of chest pain/pressure, no shortness of breath at rest.  + shortness of breath with exertion.  Denies history of atrial fibrillation or irregular heartbeat  Vascular: No history of rest pain in feet.  No history of claudication.  No history of non-healing ulcer, +history of DVT   Pulmonary: No home oxygen, no productive cough, no hemoptysis,  No asthma or wheezing  Musculoskeletal:  [X]  Arthritis, [X]  Low back pain,  [X]  Joint pain  Hematologic:No history of hypercoagulable state.  No history of easy bleeding.  No history of anemia  Gastrointestinal: No hematochezia or melena,  No gastroesophageal reflux, no trouble  swallowing  Urinary: [ ]  chronic Kidney disease, [ ]  on HD - [ ]  MWF or [ ]  TTHS, [ ]  Burning with urination, [ ]  Frequent urination, [ ]  Difficulty urinating;   Skin: No rashes  Psychological: No history of anxiety,  No history of depression   Physical Examination  Vitals:   09/23/20 0911  BP: 100/71  Pulse: 78  Resp: 20  Temp: 97.7 F (36.5 C)  SpO2: 98%  Weight: 187 lb (84.8 kg)  Height: 4\' 11"  (1.499 m)    Body mass index is 37.77 kg/m.  General:  Alert and oriented, no acute distress HEENT: Normal Neck: No JVD Cardiac: Regular Rate and Rhythm  Abdomen: obese Skin: No rash Extremity Pulses:  2+ dorsalis pedis pulses bilaterally Musculoskeletal: No deformity or edema  Neurologic: Upper and lower extremity motor 5/5 and symmetric  DATA:  I reviewed the patient's duplex ultrasound from May 2022.  This showed DVT in the posterior tibial vein on the right and the gastrocnemius vein on the left.  Also reviewed her CT scan for PE which showed a small pulmonary embolus in the right lower lobe.  ASSESSMENT: Bilateral DVT most likely secondary to operative state post procedure had pulmonary embolus   PLAN: Discussed with patient today that usual treatment for calf vein DVT and pulmonary embolus is not surgical.  Treatment is Eliquis for 6 months.  Plan would be that then discontinue the Eliquis and only consider restarting it if she had a subsequent event in the future.  She will follow-up with me on as-needed basis.   Ruta Hinds, MD [facility] Office: [phone]

## 2020-09-28 ENCOUNTER — Telehealth: Payer: Self-pay | Admitting: Internal Medicine

## 2020-09-28 NOTE — Telephone Encounter (Signed)
..   Medicaid Managed Care   Unsuccessful Outreach Note  09/28/2020 Name: Kathryn [name] MRN: [id number] DOB: 1970-06-26  Referred by: Lindell Spar, MD Reason for referral : High Risk Managed Medicaid (Attempted to reach [name] today to get her scheduled for a phone visit with the [facility] team but the call would not go through. )   An unsuccessful telephone outreach was attempted today. The patient was referred to the case management team for assistance with care management and care coordination.   Follow Up Plan: The care management team will reach out to the patient again over the next 7-14 days.   [place]

## 2020-09-30 ENCOUNTER — Ambulatory Visit: Payer: Medicaid Other | Admitting: Neurology

## 2020-10-04 ENCOUNTER — Telehealth: Payer: Self-pay

## 2020-10-04 NOTE — Telephone Encounter (Signed)
Patient called in reference to home health she has not heard anything from anyone and is requesting either outpatient or to be referred somewhere else for home health ph# [phone]

## 2020-10-04 NOTE — Telephone Encounter (Signed)
LVM letting patient know.

## 2020-10-04 NOTE — Telephone Encounter (Signed)
I have resent this today to [facility] if she doesn't hear from them within the week let her know to call us back and we will refer to outpatient

## 2020-10-05 ENCOUNTER — Telehealth: Payer: Self-pay

## 2020-10-05 ENCOUNTER — Other Ambulatory Visit: Payer: Self-pay | Admitting: *Deleted

## 2020-10-05 DIAGNOSIS — Z981 Arthrodesis status: Secondary | ICD-10-CM

## 2020-10-05 DIAGNOSIS — M4316 Spondylolisthesis, lumbar region: Secondary | ICD-10-CM

## 2020-10-05 DIAGNOSIS — M1712 Unilateral primary osteoarthritis, left knee: Secondary | ICD-10-CM

## 2020-10-05 NOTE — Telephone Encounter (Signed)
Patient called, she will contact her insurance company and give our office a call back.

## 2020-10-05 NOTE — Telephone Encounter (Signed)
Pt is calling she has not heard anything regarding home health --please advise

## 2020-10-05 NOTE — Telephone Encounter (Signed)
Santiago Glad from [facility] called they are not in network with patient insurance [employer].

## 2020-10-05 NOTE — Telephone Encounter (Signed)
We are working to find someone who will take her insurance if she would just like to go to outpatient pt I can set this up but we have to send to different home health provider please let me know if she would like to do outpatient physical therapy and I will schedule.

## 2020-10-05 NOTE — Telephone Encounter (Signed)
LMOM that we were having trouble locating a provider, if she would call the insurance company, or if she chosses we can do Outpatient Therapy, just call and let us know

## 2020-10-05 NOTE — Telephone Encounter (Signed)
Betsy called pt we have sent to multiple agencies to see if pt would like to do outpatient pt she will call us back and let us know

## 2020-10-06 NOTE — Telephone Encounter (Signed)
Reached out to the pt, no answer, voicemail is full.

## 2020-10-07 ENCOUNTER — Emergency Department (HOSPITAL_COMMUNITY): Payer: Medicaid Other

## 2020-10-07 ENCOUNTER — Other Ambulatory Visit: Payer: Self-pay

## 2020-10-07 ENCOUNTER — Encounter (HOSPITAL_COMMUNITY): Payer: Self-pay | Admitting: Emergency Medicine

## 2020-10-07 ENCOUNTER — Emergency Department (HOSPITAL_COMMUNITY)
Admission: EM | Admit: 2020-10-07 | Discharge: 2020-10-07 | Disposition: A | Discharge status: Home / Self Care | Payer: Medicaid Other | Attending: Emergency Medicine | Admitting: Emergency Medicine

## 2020-10-07 DIAGNOSIS — Z87891 Personal history of nicotine dependence: Secondary | ICD-10-CM | POA: Insufficient documentation

## 2020-10-07 DIAGNOSIS — J449 Chronic obstructive pulmonary disease, unspecified: Secondary | ICD-10-CM | POA: Diagnosis not present

## 2020-10-07 DIAGNOSIS — S2020XA Contusion of thorax, unspecified, initial encounter: Secondary | ICD-10-CM | POA: Diagnosis not present

## 2020-10-07 DIAGNOSIS — Z96652 Presence of left artificial knee joint: Secondary | ICD-10-CM | POA: Diagnosis not present

## 2020-10-07 DIAGNOSIS — W19XXXA Unspecified fall, initial encounter: Secondary | ICD-10-CM | POA: Diagnosis not present

## 2020-10-07 DIAGNOSIS — Z7901 Long term (current) use of anticoagulants: Secondary | ICD-10-CM | POA: Insufficient documentation

## 2020-10-07 DIAGNOSIS — M5459 Other low back pain: Secondary | ICD-10-CM | POA: Diagnosis not present

## 2020-10-07 DIAGNOSIS — R600 Localized edema: Secondary | ICD-10-CM | POA: Diagnosis not present

## 2020-10-07 DIAGNOSIS — S300XXA Contusion of lower back and pelvis, initial encounter: Secondary | ICD-10-CM | POA: Insufficient documentation

## 2020-10-07 DIAGNOSIS — Z7951 Long term (current) use of inhaled steroids: Secondary | ICD-10-CM | POA: Diagnosis not present

## 2020-10-07 DIAGNOSIS — J4530 Mild persistent asthma, uncomplicated: Secondary | ICD-10-CM | POA: Insufficient documentation

## 2020-10-07 DIAGNOSIS — R0602 Shortness of breath: Secondary | ICD-10-CM | POA: Diagnosis not present

## 2020-10-07 DIAGNOSIS — Z79899 Other long term (current) drug therapy: Secondary | ICD-10-CM | POA: Insufficient documentation

## 2020-10-07 DIAGNOSIS — I110 Hypertensive heart disease with heart failure: Secondary | ICD-10-CM | POA: Insufficient documentation

## 2020-10-07 DIAGNOSIS — K649 Unspecified hemorrhoids: Secondary | ICD-10-CM | POA: Diagnosis not present

## 2020-10-07 DIAGNOSIS — R0789 Other chest pain: Secondary | ICD-10-CM | POA: Diagnosis not present

## 2020-10-07 DIAGNOSIS — I509 Heart failure, unspecified: Secondary | ICD-10-CM | POA: Diagnosis not present

## 2020-10-07 DIAGNOSIS — S3992XA Unspecified injury of lower back, initial encounter: Secondary | ICD-10-CM | POA: Diagnosis present

## 2020-10-07 DIAGNOSIS — D259 Leiomyoma of uterus, unspecified: Secondary | ICD-10-CM | POA: Diagnosis not present

## 2020-10-07 DIAGNOSIS — I700 Atherosclerosis of aorta: Secondary | ICD-10-CM | POA: Diagnosis not present

## 2020-10-07 DIAGNOSIS — Z981 Arthrodesis status: Secondary | ICD-10-CM | POA: Diagnosis not present

## 2020-10-07 LAB — COMPREHENSIVE METABOLIC PANEL
ALT: 29 U/L (ref 0–44)
AST: 34 U/L (ref 15–41)
Albumin: 3.7 g/dL (ref 3.5–5.0)
Alkaline Phosphatase: 84 U/L (ref 38–126)
Anion gap: 7 (ref 5–15)
BUN: 11 mg/dL (ref 6–20)
CO2: 24 mmol/L (ref 22–32)
Calcium: 8.9 mg/dL (ref 8.9–10.3)
Chloride: 104 mmol/L (ref 98–111)
Creatinine, Ser: 0.68 mg/dL (ref 0.44–1.00)
GFR, Estimated: >60 mL/min (ref >60)
Glucose, Bld: 100 mg/dL — ABNORMAL HIGH (ref 70–99)
Potassium: 3.7 mmol/L (ref 3.5–5.1)
Sodium: 135 mmol/L (ref 135–145)
Total Bilirubin: 0.5 mg/dL (ref 0.3–1.2)
Total Protein: 7.6 g/dL (ref 6.5–8.1)

## 2020-10-07 LAB — CBC WITH DIFFERENTIAL/PLATELET
Abs Immature Granulocytes: 0.02 10*3/uL (ref 0.00–0.07)
Basophils Absolute: 0 10*3/uL (ref 0.0–0.1)
Basophils Relative: 1 %
Eosinophils Absolute: 0.2 10*3/uL (ref 0.0–0.5)
Eosinophils Relative: 3 %
HCT: 35.4 % — ABNORMAL LOW (ref 36.0–46.0)
Hemoglobin: 11.4 g/dL — ABNORMAL LOW (ref 12.0–15.0)
Immature Granulocytes: 0 %
Lymphocytes Relative: 36 %
Lymphs Abs: 2.3 10*3/uL (ref 0.7–4.0)
MCH: 28.1 pg (ref 26.0–34.0)
MCHC: 32.2 g/dL (ref 30.0–36.0)
MCV: 87.2 fL (ref 80.0–100.0)
Monocytes Absolute: 0.5 10*3/uL (ref 0.1–1.0)
Monocytes Relative: 8 %
Neutro Abs: 3.3 10*3/uL (ref 1.7–7.7)
Neutrophils Relative %: 52 %
Platelets: 527 10*3/uL — ABNORMAL HIGH (ref 150–400)
RBC: 4.06 MIL/uL (ref 3.87–5.11)
RDW: 15.5 % (ref 11.5–15.5)
WBC: 6.4 10*3/uL (ref 4.0–10.5)
nRBC: 0 % (ref 0.0–0.2)

## 2020-10-07 MED ORDER — IOHEXOL 300 MG/ML  SOLN
75.0000 mL | Freq: Once | INTRAMUSCULAR | Status: AC | PRN
  Administered 2020-10-07: 75 mL via INTRAVENOUS

## 2020-10-07 MED ORDER — OXYCODONE-ACETAMINOPHEN 5-325 MG PO TABS: 1.0000 | ORAL_TABLET | Freq: Four times a day (QID) | ORAL | 0 refills | Status: DC | PRN

## 2020-10-07 MED ORDER — FUROSEMIDE 10 MG/ML IJ SOLN
40.0000 mg | Freq: Once | INTRAMUSCULAR | Status: AC
  Administered 2020-10-07: 40 mg via INTRAVENOUS
  Filled 2020-10-07: qty 4

## 2020-10-07 MED ORDER — HYDROMORPHONE HCL 1 MG/ML IJ SOLN
0.5000 mg | Freq: Once | INTRAMUSCULAR | Status: AC
  Administered 2020-10-07: 0.5 mg via INTRAVENOUS
  Filled 2020-10-07: qty 1

## 2020-10-07 MED ORDER — OXYCODONE-ACETAMINOPHEN 5-325 MG PO TABS
2.0000 | ORAL_TABLET | Freq: Once | ORAL | Status: AC
  Administered 2020-10-07: 2 via ORAL
  Filled 2020-10-07: qty 2

## 2020-10-07 MED ORDER — ONDANSETRON HCL 4 MG/2ML IJ SOLN
4.0000 mg | Freq: Once | INTRAMUSCULAR | Status: AC
  Administered 2020-10-07: 4 mg via INTRAVENOUS
  Filled 2020-10-07: qty 2

## 2020-10-07 NOTE — Discharge Instructions (Addendum)
Get your blood thinner and your other medicines including your fluid pills at the pharmacy.  Your Medicaid will cover most of the cost.  Follow-up with your doctor if any problem

## 2020-10-07 NOTE — ED Triage Notes (Signed)
Pt complains of back pain that radiates to her right rib cage. Pt states she also has hemorrhoids and is on eliquis and it will not stop bleeding.

## 2020-10-07 NOTE — Progress Notes (Signed)
Transition of Care [facility]) - Emergency Department Mini Assessment   Patient Details  Name: Kathryn [name] MRN: [id number] Date of Birth: November 16, 1970  Transition of Care [facility]) CM/SW Contact:    Iona Beard, [place] Phone Number: 10/07/2020, 12:20 PM   Clinical Narrative: [facility] consulted for medication assistance. CSW spoke with pt about needs. Pt states that she cannot afford her medication. CSW explained to pt that she does not qualify for a MATCH voucher as she has insurance. Pt questioned CSW about how she would pay for her meds, CSW explained that we can provide her with a discount card but that the hospital does not offer any other assistance for medications. Pt stated "so I guess I'll come to the ED everyday to get my medications", CSW educated pt that the hospital does not provide medications for free to anyone. CSW recommended that pt reach out to DSS to inquire about assistance programs, pt verbalized understanding and that she would make the call. Pt to be provided with a GoodRx card at d/c. TOC signing up.   ED Mini Assessment: What brought you to the Emergency Department? : Back pain  Barriers to Discharge: ED Medication assistance  Barrier interventions: Explained medication assistance with pt  Means of departure: Car  Interventions which prevented an admission or readmission: Other (must enter comment)    Patient Contact and Communications        ,          Patient states their goals for this hospitalization and ongoing recovery are:: Go home CMS Medicare.gov Compare Post Acute Care list provided to:: Patient Choice offered to / list presented to : Patient  Admission diagnosis:  BACK/RIB PAIN Patient Active Problem List   Diagnosis Date Noted   S/P lumbar fusion 09/14/2020   Acute deep vein thrombosis (DVT) of proximal vein of lower extremity ([place]) 09/14/2020   Spondylolisthesis at L4-L5 level 03/29/2020   Insomnia 03/19/2020   Mild persistent asthma without  complication 04/09/8249   Need for immunization against influenza 03/19/2020   Body mass index (BMI) 38.0-38.9, adult 03/19/2020   GERD (gastroesophageal reflux disease) 03/19/2020   Intermittent palpitations 12/17/2019   Snoring 12/17/2019   Excessive daytime sleepiness 12/17/2019   Nightmare disorder, 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

## 2020-10-07 NOTE — ED Provider Notes (Signed)
[facility] EMERGENCY DEPARTMENT Provider Note   CSN: [id number] Arrival date & time: 10/07/20  0703     History Chief Complaint  Patient presents with   Back Pain    Kathryn [name] is a 50 y.o. female.  Patient states she took a fall and complains of pain in her lower back and right side of her chest..  Patient also states she has not been taking her diuretic and her last Eliquis was yesterday.  The history is provided by the patient and medical records. No language interpreter was used.  Back Pain Location:  Generalized Quality:  Aching Radiates to:  Does not radiate Pain severity:  Unable to specify Onset quality:  Sudden Duration: two days. Timing:  Constant Progression:  Waxing and waning Chronicity:  New Context: not emotional stress   Relieved by:  Nothing Worsened by:  Nothing Ineffective treatments:  None tried Associated symptoms: chest pain   Associated symptoms: no abdominal pain and no headaches       Past Medical History:  Diagnosis Date   Anxiety    Arthritis    Asthma    Bipolar disorder (HCC)    Chronic back pain    COPD (chronic obstructive pulmonary disease) (HCC)    Chronic bronchitis   Depression    GERD (gastroesophageal reflux disease)    Hypertension    Neuropathy    Osteoarthritis of left knee, 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   Sleep apnea 04/2020   GETTING A cpap   Suicidal ideation 12/24/2018    Patient Active Problem List   Diagnosis Date Noted   S/P lumbar fusion 09/14/2020   Acute deep vein thrombosis (DVT) of proximal vein of lower extremity ([place]) 09/14/2020   Spondylolisthesis at L4-L5 level 03/29/2020   Insomnia 03/19/2020   Mild persistent asthma without complication 67/20/9470   Need for immunization against influenza 03/19/2020   Body mass index (BMI) 38.0-38.9, adult 03/19/2020   GERD (gastroesophageal reflux disease) 03/19/2020   Intermittent palpitations 12/17/2019   Snoring 12/17/2019   Excessive daytime sleepiness  12/17/2019   Nightmare disorder, 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   Enlarged uterus 02/20/2018   Screening for colorectal cancer 02/20/2018   Osteoarthritis of left knee, 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   MDD (major depressive disorder), recurrent episode, severe ([place]) 12/24/2017   Low back pain, unspecified 05/11/2017   Low vitamin B12 level 05/11/2017   Primary hypertension 01/11/2017    Past Surgical History:  Procedure Laterality Date   BACK SURGERY     DILATATION AND CURETTAGE/HYSTEROSCOPY WITH MINERVA N/A 06/09/2020   Procedure: DILATATION AND CURETTAGE/HYSTEROSCOPY WITH MINERVA;  Surgeon: Florian Buff, MD;  Location: [facility] ORS;  Service: Gynecology;  Laterality: N/A;   ESOPHAGOGASTRODUODENOSCOPY (EGD) WITH PROPOFOL N/A 12/25/2018   Procedure: ESOPHAGOGASTRODUODENOSCOPY (EGD) WITH PROPOFOL;  Surgeon: Rogene Houston, MD;  Location: [facility] ENDO SUITE;  Service: Endoscopy;  Laterality: N/A;   PATELLA-FEMORAL ARTHROPLASTY Left 10/08/2018   Procedure: PATELLA-FEMORAL ARTHROPLASTY;  Surgeon: Marchia Bond, MD;  Location: [facility] ORS;  Service: Orthopedics;  Laterality: Left;   TUBAL LIGATION       OB History     Gravida  5   Para  3   Term  2   Preterm  1   AB  2   Living  3      SAB  1   IAB  1   Ectopic      Multiple      Live Births  Family History  Problem Relation Age of Onset   Gout Paternal Grandfather    Cirrhosis Paternal Grandfather    Hypertension Paternal Grandmother    Aneurysm Paternal Grandmother    Cirrhosis Maternal Grandmother    Cirrhosis Maternal Grandfather    Cancer Father    Cirrhosis Father    Cirrhosis Mother    Breast cancer Sister    Hypertension Sister    Bronchitis Daughter    Bronchitis Daughter    Asthma Son    Bronchitis Son    Migraines Neg Hx     Social History   Tobacco Use   Smoking status: Former    Pack years: 0.00    Types: Cigarettes    Quit date: 2016    Years since quitting: 6.4   Smokeless  tobacco: Never   Tobacco comments:    wears nicotine patches  Vaping Use   Vaping Use: Never used  Substance Use Topics   Alcohol use: Not Currently   Drug use: Not Currently    Types: Cocaine    Comment: crack  last used 2016    Home Medications Prior to Admission medications   Medication Sig Start Date End Date Taking? Authorizing Provider  albuterol (PROVENTIL HFA;VENTOLIN HFA) 108 (90 Base) MCG/ACT inhaler Inhale 2 puffs into the lungs every 6 (six) hours as needed for wheezing or shortness of breath.   Yes [provider]  albuterol (PROVENTIL) (2.5 MG/3ML) 0.083% nebulizer solution Take 2.5 mg by nebulization every 6 (six) hours as needed for wheezing or shortness of breath.   Yes [provider]  amLODipine (NORVASC) 5 MG tablet Take 5 mg by mouth daily.   Yes [provider]  apixaban (ELIQUIS) 5 MG TABS tablet Take 1 tablet (5 mg total) by mouth 2 (two) times daily. 09/07/20  Yes Costella, Vista Mink, PA-C  budesonide-formoterol (SYMBICORT) 80-4.5 MCG/ACT inhaler Inhale 2 puffs into the lungs 2 (two) times daily. 03/19/20  Yes Lindell Spar, MD  celecoxib (CELEBREX) 200 MG capsule Take 1 capsule (200 mg total) by mouth 2 (two) times daily. For pain 08/16/20  Yes Lindell Spar, MD  citalopram (CELEXA) 20 MG tablet Take 20 mg by mouth daily.   Yes [provider]  cloNIDine (CATAPRES) 0.1 MG tablet TAKE 1 TABLET BY MOUTH ONCE DAILY FOR BLOOD PRESSURE AND ANXIETY. Patient taking differently: Take 0.1 mg by mouth daily. 06/02/20  Yes Lindell Spar, MD  hydrOXYzine (VISTARIL) 50 MG capsule Take 1 capsule (50 mg total) by mouth daily as needed for anxiety (Insomnia). Patient taking differently: Take 50 mg by mouth at bedtime. 03/19/20  Yes Lindell Spar, MD  methocarbamol (ROBAXIN) 500 MG tablet Take 500 mg by mouth in the morning and at bedtime.   Yes [provider]  omeprazole (PRILOSEC) 20 MG capsule Take 1 capsule (20 mg total) by mouth  daily. 08/16/20  Yes Lindell Spar, MD  ondansetron (ZOFRAN ODT) 8 MG disintegrating tablet Take 1 tablet (8 mg total) by mouth every 8 (eight) hours as needed for nausea or vomiting. 06/09/20  Yes Florian Buff, MD  pregabalin (LYRICA) 75 MG capsule Take 1 capsule (75 mg total) by mouth 2 (two) times daily. 08/16/20  Yes Patel, Colin Broach, MD  tiZANidine (ZANAFLEX) 2 MG tablet TAKE 1 TABLET BY MOUTH TWICE DAILY AS NEEDED FOR MUSCLE SPASMS Patient taking differently: Take 2 mg by mouth 2 (two) times daily as needed for muscle spasms. 09/14/20  Yes Posey Pronto,  Colin Broach, MD  torsemide (DEMADEX) 5 MG tablet Take 1 tablet (5 mg total) by mouth daily. 08/16/20  Yes Lindell Spar, MD  Vitamin D, Ergocalciferol, (DRISDOL) 1.25 MG (50000 UNIT) CAPS capsule Take 50,000 Units by mouth every 7 (seven) days.   Yes [provider]  Boric Acid Vaginal 600 MG SUPP Place 600 mg vaginally as directed. Place one capsule inside the vagina at bedtime every other night for 3 doses. Use once a week at bedtime thereafter for foul odor. Patient not taking: Reported on 10/07/2020 05/28/20   Margarita Mail, PA-C  morphine (MSIR) 30 MG tablet Take 1 tablet (30 mg total) by mouth every 4 (four) hours as needed for severe pain. Patient not taking: No sig reported 09/02/20   Margarita Mail, PA-C  ondansetron (ZOFRAN ODT) 4 MG disintegrating tablet Take 1 tablet (4 mg total) by mouth every 8 (eight) hours as needed for nausea or vomiting. Patient not taking: Reported on 10/07/2020 06/01/20   Truddie Hidden, MD  oxyCODONE-acetaminophen (PERCOCET) 5-325 MG tablet Take 1 tablet by mouth every 6 (six) hours as needed for moderate pain. 10/07/20   Milton Ferguson, MD  Relugolix-Estradiol-Norethind (MYFEMBREE) 40-1-0.5 MG TABS Take 1 tablet by mouth daily. Patient not taking: Reported on 10/07/2020 08/06/20   Florian Buff, MD    Allergies    Clonopin [clonazepam], Fish allergy, Flexeril [cyclobenzaprine hcl], Ibuprofen, Shellfish  allergy, Tylenol [acetaminophen], Ace inhibitors, Cyclobenzaprine, Tramadol, and Trazodone and nefazodone  Review of Systems   Review of Systems  Constitutional:  Negative for appetite change and fatigue.  HENT:  Negative for congestion, ear discharge and sinus pressure.   Eyes:  Negative for discharge.  Respiratory:  Negative for cough.   Cardiovascular:  Positive for chest pain.  Gastrointestinal:  Negative for abdominal pain and diarrhea.  Genitourinary:  Negative for frequency and hematuria.  Musculoskeletal:  Positive for back pain.  Skin:  Negative for rash.  Neurological:  Negative for seizures and headaches.  Psychiatric/Behavioral:  Negative for hallucinations.    Physical Exam Updated Vital Signs BP 128/74 (BP Location: Left Arm)   Pulse 84   Temp 98 F (36.7 C) (Oral)   Resp (!) 22   Ht 4\' 11"  (1.499 m)   Wt 72.6 kg   LMP 06/07/2020   SpO2 100%   BMI 32.32 kg/m   Physical Exam Vitals and nursing note reviewed.  Constitutional:      Appearance: She is well-developed.  HENT:     Head: Normocephalic.  Eyes:     General: No scleral icterus.    Conjunctiva/sclera: Conjunctivae normal.  Neck:     Thyroid: No thyromegaly.  Cardiovascular:     Rate and Rhythm: Normal rate and regular rhythm.     Heart sounds: No murmur heard.   No friction rub. No gallop.     Comments: Tender right chest Pulmonary:     Breath sounds: No stridor. No wheezing or rales.  Chest:     Chest wall: No tenderness.  Abdominal:     General: There is no distension.     Tenderness: There is no abdominal tenderness. There is no rebound.  Musculoskeletal:        General: Normal range of motion.     Cervical back: Neck supple.     Comments: Tender lumbar sine  Lymphadenopathy:     Cervical: No cervical adenopathy.  Skin:    Findings: No erythema or rash.  Neurological:     Mental Status:  She is alert and oriented to person, place, and time.     Motor: No abnormal muscle tone.      Coordination: Coordination normal.  Psychiatric:        Behavior: Behavior normal.    ED Results / Procedures / Treatments   Labs (all labs ordered are listed, but only abnormal results are displayed) Labs Reviewed  CBC WITH DIFFERENTIAL/PLATELET - Abnormal; Notable for the following components:      Result Value   Hemoglobin 11.4 (*)    HCT 35.4 (*)    Platelets 527 (*)    All other components within normal limits  COMPREHENSIVE METABOLIC PANEL - Abnormal; Notable for the following components:   Glucose, Bld 100 (*)    All other components within normal limits    EKG None  Radiology CT ABDOMEN PELVIS W CONTRAST  Result Date: 10/07/2020 CLINICAL DATA:  Back pain radiating to right ribcage, bleeding hemorrhoids, anticoagulation EXAM: CT ABDOMEN AND PELVIS WITH CONTRAST TECHNIQUE: Multidetector CT imaging of the abdomen and pelvis was performed using the standard protocol following bolus administration of intravenous contrast. CONTRAST:  23mL OMNIPAQUE IOHEXOL 300 MG/ML  SOLN COMPARISON:  12/24/2018 FINDINGS: Lower chest: No acute abnormality. Hepatobiliary: No solid liver abnormality is seen. Stable, benign subcapsular cyst or hemangioma of the anterior right lobe of the liver (series 2, image 21). No gallstones, gallbladder wall thickening, or biliary dilatation. Pancreas: Unremarkable. No pancreatic ductal dilatation or surrounding inflammatory changes. Spleen: Normal in size without significant abnormality. Adrenals/Urinary Tract: Adrenal glands are unremarkable. Kidneys are normal, without renal calculi, solid lesion, or hydronephrosis. Bladder is unremarkable. Stomach/Bowel: Stomach is within normal limits. Appendix appears normal. No evidence of bowel wall thickening, distention, or inflammatory changes. Vascular/Lymphatic: Aortic atherosclerosis. No enlarged abdominal or pelvic lymph nodes. Reproductive: Uterine fibroids. Other: No abdominal wall hernia or abnormality. No  abdominopelvic ascites. Musculoskeletal: No acute or significant osseous findings. Status post interval posterior laminectomy and fusion of L3 through L5. IMPRESSION: 1. No acute CT findings of the abdomen or pelvis to explain right-sided back pain. 2. Status post interval posterior laminectomy and fusion of L3 through L5. 3. Uterine fibroids. Aortic Atherosclerosis (ICD10-I70.0). Electronically Signed   By: Eddie Candle M.D.   On: 10/07/2020 10:41   DG Chest Port 1 View  Result Date: 10/07/2020 CLINICAL DATA:  Shortness of breath. EXAM: PORTABLE CHEST 1 VIEW COMPARISON:  CT 09/11/2020. FINDINGS: Mediastinum and hilar structures normal. Heart size normal. No focal infiltrate. No pleural effusion or pneumothorax. IMPRESSION: No acute cardiopulmonary disease. Electronically Signed   By: Marcello Moores  Register   On: 10/07/2020 08:45    Procedures Procedures   Medications Ordered in ED Medications  HYDROmorphone (DILAUDID) injection 0.5 mg (0.5 mg Intravenous Given 10/07/20 0833)  ondansetron (ZOFRAN) injection 4 mg (4 mg Intravenous Given 10/07/20 0833)  iohexol (OMNIPAQUE) 300 MG/ML solution 75 mL (75 mLs Intravenous Contrast Given 10/07/20 1001)  furosemide (LASIX) injection 40 mg (40 mg Intravenous Given 10/07/20 1115)  oxyCODONE-acetaminophen (PERCOCET/ROXICET) 5-325 MG per tablet 2 tablet (2 tablets Oral Given 10/07/20 1114)    ED Course  I have reviewed the triage vital signs and the nursing notes.  Pertinent labs & imaging results that were available during my care of the patient were reviewed by me and considered in my medical decision making (see chart for details). Patient with contusion to chest and back from fall.  CT scan unremarkable.  Patient with peripheral edema that has been helped by Lasix.  Patient states that  she will get her Eliquis and diuretics filled.  She is given prescription pain medicine for the contusions   MDM Rules/Calculators/A&P                         Fall with  contusion to lower back and chest.  Also peripheral edema  with a history of congestive heart failure Final Clinical Impression(s) / ED Diagnoses Final diagnoses:  Fall, initial encounter    Rx / DC Orders ED Discharge Orders          Ordered    oxyCODONE-acetaminophen (PERCOCET) 5-325 MG tablet  Every 6 hours PRN,   Status:  Discontinued        10/07/20 1237    oxyCODONE-acetaminophen (PERCOCET) 5-325 MG tablet  Every 6 hours PRN        10/07/20 1238             Milton Ferguson, MD 10/11/20 1018

## 2020-10-08 ENCOUNTER — Telehealth: Payer: Self-pay

## 2020-10-08 NOTE — Telephone Encounter (Signed)
Transition Care Management Unsuccessful Follow-up Telephone Call  Date of discharge and from where:  10/07/2020 from [facility]  Attempts:  1st Attempt  Reason for unsuccessful TCM follow-up call:  Unable to leave message

## 2020-10-11 ENCOUNTER — Other Ambulatory Visit (INDEPENDENT_AMBULATORY_CARE_PROVIDER_SITE_OTHER): Payer: Self-pay | Admitting: Internal Medicine

## 2020-10-11 NOTE — Telephone Encounter (Signed)
Transition Care Management Follow-up Telephone Call Date of discharge and from where: 10/07/20 from [facility] How have you been since you were released from the hospital? Pt stated that she is hurting and her legs swelling. Pt is also having problems with bowels moving. I reviewed medication list with pt and she stated that she does not have any toresmide to take for her leg swelling. Pt encouraged to reach out to PCP for a HOS follow up appt with 1st provider available.  Any questions or concerns? No  Items Reviewed: Did the pt receive and understand the discharge instructions provided? Yes  Medications obtained and verified? Yes  Other? No  Any new allergies since your discharge? No  Dietary orders reviewed? No Do you have support at home? Yes   Functional Questionnaire: (I = Independent and D = Dependent) ADLs: I Difficult due to pain   Bathing/Dressing- I  Meal Prep- I  Eating- I  Maintaining continence- I  Transferring/Ambulation- I  Managing Meds- I   Follow up appointments reviewed:  PCP Hospital f/u appt confirmed? No   Specialist Hospital f/u appt confirmed? No   Are transportation arrangements needed? No  If their condition worsens, is the pt aware to call PCP or go to the Emergency Dept.? Yes Was the patient provided with contact information for the PCP's office or ED? Yes Was to pt encouraged to call back with questions or concerns? Yes

## 2020-10-12 ENCOUNTER — Ambulatory Visit: Payer: Medicaid Other | Admitting: Family Medicine

## 2020-10-12 ENCOUNTER — Other Ambulatory Visit: Payer: Self-pay

## 2020-10-12 ENCOUNTER — Encounter: Payer: Self-pay | Admitting: Family Medicine

## 2020-10-12 VITALS — BP 121/83 | HR 90 | Temp 98.4°F | Resp 20 | Ht 59.0 in | Wt 186.0 lb

## 2020-10-12 DIAGNOSIS — I10 Essential (primary) hypertension: Secondary | ICD-10-CM | POA: Diagnosis not present

## 2020-10-12 DIAGNOSIS — R600 Localized edema: Secondary | ICD-10-CM

## 2020-10-12 DIAGNOSIS — I824Y9 Acute embolism and thrombosis of unspecified deep veins of unspecified proximal lower extremity: Secondary | ICD-10-CM

## 2020-10-12 DIAGNOSIS — Z981 Arthrodesis status: Secondary | ICD-10-CM | POA: Diagnosis not present

## 2020-10-12 DIAGNOSIS — F332 Major depressive disorder, recurrent severe without psychotic features: Secondary | ICD-10-CM

## 2020-10-12 DIAGNOSIS — M4316 Spondylolisthesis, lumbar region: Secondary | ICD-10-CM | POA: Diagnosis not present

## 2020-10-12 MED ORDER — TORSEMIDE 5 MG PO TABS: 5.0000 mg | ORAL_TABLET | Freq: Every day | ORAL | 0 refills | Status: DC

## 2020-10-12 MED ORDER — POTASSIUM CHLORIDE CRYS ER 20 MEQ PO TBCR: 20.0000 meq | EXTENDED_RELEASE_TABLET | Freq: Every day | ORAL | 0 refills | Status: DC

## 2020-10-12 NOTE — Progress Notes (Signed)
   Kathryn [name]     MRN: [id number]      DOB: May 02, 1970   HPI Kathryn [name] is here for follow up of recent ED visit on 06/23 following 2 falls. C/o bilateral leg swelling, initially seen 5/28, responds to lasix she is requesting a diuretic  Had back surgery on 08/20/2020, since that time new problems, of numbness and tingling in both legs and LUE, and swelling also new.  Reports no h/h or PT despite being referred several weeks ago by her PCP ROS Denies recent fever or chills. Denies sinus pressure, nasal congestion, ear pain or sore throat. Denies chest congestion, productive cough or wheezing. Denies chest pains or palpitations  Denies abdominal pain, nausea, vomiting,diarrhea or constipation.   Denies dysuria, frequency, hesitancy or incontinence. C/o  joint pain,  and limitation in mobility. Denies headaches, seizures, c/o numbness, . D  PE  BP 121/83 (BP Location: Right Arm, Patient Position: Sitting, Cuff Size: Large)   Pulse 90   Temp 98.4 F (36.9 C)   Resp 20   Ht 4\' 11"  (1.499 m)   Wt 186 lb (84.4 kg)   LMP 06/07/2020   SpO2 97%   BMI 37.57 kg/m   Patient alert and oriented and in no cardiopulmonary distress.  HEENT: No facial asymmetry, EOMI,     Neck supple .  Chest: Clear to auscultation bilaterally.  CVS: S1, S2 no murmurs, no S3.Regular rate.  ABD: Soft non tender.   Ext: one plus pitting  edema  ST:[id number]  ROM spine,  Skin: Intact, no ulcerations or rash noted.  Psych: Good eye contact, normal affect. Memory intact mildly  anxious and depressed appearing.  CNS: CN 2-12 intact, power,  normal throughout.no focal deficits noted.   Assessment & Plan  Bilateral leg edema Resume demadex and potassium, one month only will f/u with PCP  Acute deep vein thrombosis (DVT) of proximal vein of lower extremity (HCC) On eliquis  MDD (major depressive disorder), recurrent episode, severe ([place]) Not controlled , not suicidal or homicidal, will f/u with  PCP, no med change at this visit  Primary hypertension Controlled, no change in medication      Spondylolisthesis at L4-L5 level S/P recent surgery needing H/H and therapy, nursing to f/u on this as was ordered several weeks ago by PCP  S/P lumbar fusion C/O new lower and upper ext numbness post op , to be followed by Neurosurgery  Morbid obesity ([place]) Associated with arthritis and depression  Patient re-educated about  the importance of commitment to a  minimum of 150 minutes of exercise per week as able.  The importance of healthy food choices with portion control discussed, as well as eating regularly and within a 12 hour window most days. The need to choose "clean , green" food 50 to 75% of the time is discussed, as well as to make water the primary drink and set a goal of 64 ounces water daily.    Weight /BMI 10/12/2020 10/07/2020 09/23/2020  WEIGHT 186 lb 160 lb 187 lb  HEIGHT 4\' 11"  4\' 11"  4\' 11"   BMI 37.57 kg/m2 32.32 kg/m2 37.77 kg/m2  Some encounter information is confidential and restricted. Go to Review Flowsheets activity to see all data.

## 2020-10-12 NOTE — Patient Instructions (Signed)
F/U with Dr Posey Pronto as before, call if you need to be seen sooner  Nurse, please follow through on referral for PT and h/H sent by Dr Posey Pronto to try to expidite he process  I have prescribed daily torsemide and potassium for leg swelling, you were on this in the past  Be careful not to fall  Please discuss new weaknes and numbness with your surgeon  Thanks for choosing [facility], we consider it a privelige to serve you.

## 2020-10-13 ENCOUNTER — Encounter (HOSPITAL_COMMUNITY): Payer: Self-pay | Admitting: Physical Therapy

## 2020-10-13 ENCOUNTER — Ambulatory Visit (HOSPITAL_COMMUNITY): Payer: Medicaid Other | Attending: Internal Medicine | Admitting: Physical Therapy

## 2020-10-13 DIAGNOSIS — M545 Low back pain, unspecified: Secondary | ICD-10-CM | POA: Diagnosis not present

## 2020-10-13 DIAGNOSIS — M6281 Muscle weakness (generalized): Secondary | ICD-10-CM

## 2020-10-13 DIAGNOSIS — R2689 Other abnormalities of gait and mobility: Secondary | ICD-10-CM | POA: Diagnosis not present

## 2020-10-13 NOTE — Therapy (Signed)
[address] [place], [place], [address] Phone: [phone]   Fax:  [phone]  Physical Therapy Evaluation  Patient Details  Name: Kathryn [name] MRN: [id number] Date of Birth: 1970-07-26 Referring Provider (PT): Ihor Dow MD   Encounter Date: 10/13/2020   PT End of Session - 10/13/20 1336     Visit Number 1    Number of Visits 12    Date for PT Re-Evaluation 11/24/20    Authorization Type Medicaid healthy blue    Authorization Time Period Check auth    PT Start Time 1305    PT Stop Time 1345    PT Time Calculation (min) 40 min    Equipment Utilized During Treatment Gait belt;Back brace    Activity Tolerance Patient tolerated treatment well    Behavior During Therapy [facility] for tasks assessed/performed             Past Medical History:  Diagnosis Date   Anxiety    Arthritis    Asthma    Bipolar disorder ([place])    Chronic back pain    COPD (chronic obstructive pulmonary disease) ([place])    Chronic bronchitis   Depression    GERD (gastroesophageal reflux disease)    Hypertension    Neuropathy    Osteoarthritis of left knee, 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   Sleep apnea 04/2020   GETTING A cpap   Suicidal ideation 12/24/2018    Past Surgical History:  Procedure Laterality Date   BACK SURGERY     DILATATION AND CURETTAGE/HYSTEROSCOPY WITH MINERVA N/A 06/09/2020   Procedure: DILATATION AND CURETTAGE/HYSTEROSCOPY WITH MINERVA;  Surgeon: Florian Buff, MD;  Location: [facility];  Service: Gynecology;  Laterality: N/A;   ESOPHAGOGASTRODUODENOSCOPY (EGD) WITH PROPOFOL N/A 12/25/2018   Procedure: ESOPHAGOGASTRODUODENOSCOPY (EGD) WITH PROPOFOL;  Surgeon: Rogene Houston, MD;  Location: [facility] SU[facility];  Service: Endoscopy;  Laterality: N/A;   PATELLA-FEMORAL ARTHROPLASTY Left 10/08/2018   Procedure: PATELLA-FEMORAL ARTHROPLASTY;  Surgeon: Marchia Bond, MD;  Location: [facility];  Service: Orthopedics;  Laterality: Left;   TUBAL  LIGATION      There were no vitals filed for this visit.    Subjective Assessment - 10/13/20 1311     Subjective Patient presents to therapy s/p lumbar fusion on 08/20/20. She states she is a little better but is still having lots of pain and soreness, especially in hips. She is currently ambulating using RW. Was independent with gait prior to this. She had some inpatient therapy, no HH. Denies taking medication for pain.    Pertinent History s/p lumbar fusion    Limitations Lifting;Standing;Walking;House hold activities    How long can you stand comfortably? <5 min    How long can you walk comfortably? <5 min    Patient Stated Goals Get walking again without walker    Currently in Pain? Yes    Pain Score 10-Worst pain ever   global, back, legs, feet, hips   Pain Location Leg    Pain Orientation Right;Left;Posterior    Pain Descriptors / Indicators Aching;Burning;Tingling;Sharp;Shooting;Numbness;Dull    Pain Type Surgical pain    Pain Onset More than a month ago    Pain Frequency Constant    Aggravating Factors  standing, walking    Pain Relieving Factors meds, rest    Effect of Pain on Daily Activities limits                [facility] PT Assessment - 10/13/20 0001  Assessment   Medical Diagnosis s/p Lumbar fusion    Referring Provider (PT) Ihor Dow MD    Onset Date/Surgical Date 08/20/20    Prior Therapy Yes inpatient      Precautions   Precaution Comments No stated BLT restriction    Required Braces or Orthoses Spinal Brace   lumbar back brace, can remove this week (patient reported)     Restrictions   Weight Bearing Restrictions No      Balance Screen   Has the patient fallen in the past 6 months Yes    How many times? 2    Has the patient had a decrease in activity level because of a fear of falling?  Yes    Is the patient reluctant to leave their home because of a fear of falling?  Yes      [place] residence     Living Arrangements Children      Prior Function   Level of Independence Independent      Cognition   Overall Cognitive Status Within Functional Limits for tasks assessed      ROM / Strength   AROM / PROM / Strength AROM;Strength      AROM   AROM Assessment Site Lumbar    Lumbar Flexion 90% limited   very shakey, unsteady, off balance easily   Lumbar Extension 100% limited      Strength   Strength Assessment Site Hip;Knee;Ankle    Right/Left Hip Right;Left    Right Hip Flexion 4-/5    Right Hip Extension 3-/5    Right Hip ABduction 2+/5    Left Hip Flexion 3+/5    Left Hip Extension 2+/5    Left Hip ABduction 2+/5    Right/Left Knee Right;Left    Right Knee Extension 4+/5    Left Knee Extension 4+/5    Right/Left Ankle Right;Left    Right Ankle Dorsiflexion 3+/5    Left Ankle Dorsiflexion 2+/5                        Objective measurements completed on examination: See above findings.       [place] Adult PT Treatment/Exercise - 10/13/20 0001       Ambulation/Gait   Ambulation/Gait Yes    Ambulation/Gait Assistance 5: Supervision    Ambulation Distance (Feet) 150 Feet    Assistive device Rolling walker    Gait Pattern Decreased step length - right;Decreased step length - left;Decreased dorsiflexion - right;Decreased dorsiflexion - left;Trunk flexed    Gait Comments 2MWT      Exercises   Exercises Knee/Hip;Lumbar      Lumbar Exercises: Supine   Ab Set 10 reps    Glut Set 10 reps    Clam 10 reps    Clam Limitations RTB                    PT Education - 10/13/20 1314     Education Details on evaluation findings, POC and HEP    Person(s) Educated Patient    Methods Explanation;Handout    Comprehension Verbalized understanding              PT Short Term Goals - 10/13/20 1339       PT SHORT TERM GOAL #1   Title Patient will be independent with initial HEP and self-management strategies to improve functional outcomes    Time  3    Period Weeks  Status New    Target Date 11/03/20               PT Long Term Goals - 10/13/20 1625       PT LONG TERM GOAL #1   Title Patient will have equal to or > 4/5 MMT throughout BLE to improve ability to perform functional mobility, stair ambulation and ADLs.    Time 6    Period Weeks    Status New    Target Date 11/24/20      PT LONG TERM GOAL #2   Title Patient will be able to ambulate at least 275 feet during 2MWT with LRAD to demonstrate improved ability to perform functional mobility and associated tasks.    Time 6    Period Weeks    Status New    Target Date 11/24/20      PT LONG TERM GOAL #3   Title Patient will be able to stand > 15 minutes with pain not to exceed 3/10 in lumbar to improve ability to perform cooking and grooming ADLs.    Time 6    Period Weeks    Status New    Target Date 11/24/20      PT LONG TERM GOAL #4   Title Patient will report at least 70% overall improvement in subjective complaint to indicate improvement in ability to perform ADLs.    Time 6    Period Weeks    Status New    Target Date 11/24/20                    Plan - 10/13/20 1337     Clinical Impression Statement Patient is a 50 y.o. female who presents to physical therapy with complaint of LBP s/p lumbar fusion 08/20/20. Patient demonstrates decreased strength, ROM restriction, balance deficits and gait abnormalities which are likely contributing to symptoms of pain and are negatively impacting patient ability to perform ADLs and functional mobility tasks. Patient will benefit from skilled physical therapy services to address these deficits to reduce pain, improve level of function with ADLs, functional mobility tasks, and reduce risk for falls.    Examination-Activity Limitations Squat;Stairs;Stand;Transfers;Lift;Locomotion Level    Examination-Participation Restrictions Laundry;Yard Work;Cleaning;Community Activity    Stability/Clinical Decision Making  Stable/Uncomplicated    Clinical Decision Making Low    Rehab Potential Good    PT Frequency 2x / week    PT Duration 6 weeks    PT Treatment/Interventions ADLs/Self Care Home Management;Aquatic Therapy;Biofeedback;Cryotherapy;Gait training;Orthotic Fit/Training;Stair training;Functional mobility training;Electrical Stimulation;Iontophoresis 4mg /ml Dexamethasone;Therapeutic activities;Therapeutic exercise;Moist Heat;Balance training;Traction;Manual lymph drainage;Manual techniques;Vasopneumatic Device;Splinting;Energy conservation;Taping;Joint Manipulations;Dry needling;Passive range of motion;Patient/family education;DME Instruction;Contrast Bath;Neuromuscular re-education;Compression bandaging;Scar mobilization;Spinal Manipulations;Visual/perceptual remediation/compensation;Ultrasound;Parrafin;Fluidtherapy    PT Next Visit Plan Review goals and HEP. Progress core, glute strength and lumbar mobility as tolerated. Progress to balance when able. Bridge, ab march, LTR, SLR, sit to stand    PT Home Exercise Plan Eval: glute set, ab set, band clamshell (supine)    Consulted and Agree with Plan of Care Patient             Patient will benefit from skilled therapeutic intervention in order to improve the following deficits and impairments:  Abnormal gait, Decreased endurance, Hypomobility, Impaired sensation, Decreased activity tolerance, Decreased balance, Decreased mobility, Difficulty walking, Pain, Decreased strength, Decreased range of motion, Improper body mechanics  Visit Diagnosis: Low back pain, unspecified back pain laterality, unspecified chronicity, unspecified whether sciatica present  Muscle weakness (generalized)  Other abnormalities of gait and mobility  Problem List Patient Active Problem List   Diagnosis Date Noted   S/P lumbar fusion 09/14/2020   Acute deep vein thrombosis (DVT) of proximal vein of lower extremity (HCC) 09/14/2020   Spondylolisthesis at L4-L5 level  03/29/2020   Insomnia 03/19/2020   Mild persistent asthma without complication 16/01/9603   Need for immunization against influenza 03/19/2020   Body mass index (BMI) 38.0-38.9, adult 03/19/2020   GERD (gastroesophageal reflux disease) 03/19/2020   Intermittent palpitations 12/17/2019   Snoring 12/17/2019   Excessive daytime sleepiness 12/17/2019   Nightmare disorder, 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   Enlarged uterus 02/20/2018   Screening for colorectal cancer 02/20/2018   Osteoarthritis of left knee, 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   MDD (major depressive disorder), recurrent episode, severe ([place]) 12/24/2017   Low back pain, unspecified 05/11/2017   Low vitamin B12 level 05/11/2017   Primary hypertension 01/11/2017   4:27 PM, 10/13/20 Josue Hector PT DPT  Physical Therapist with [facility]  [phone] [place] [address] [place], [place], [address] Phone: [phone]   Fax:  [phone]  Name: Kathryn [name] MRN: [id number] Date of Birth: 06-24-1970

## 2020-10-13 NOTE — Patient Instructions (Signed)
Access Code: [email or web address] URL: [email or web address] Date: 10/13/2020 Prepared by: Josue Hector  Exercises Supine Transversus Abdominis Bracing - Hands on Stomach - 3 x daily - 7 x weekly - 1-2 sets - 10 reps - 5 second hold Hooklying Clamshell with Resistance - 3 x daily - 7 x weekly - 1-2 sets - 10 reps - 5 second hold Hooklying Gluteal Sets - 3 x daily - 7 x weekly - 1-2 sets - 10 reps - 5 second hold

## 2020-10-15 ENCOUNTER — Other Ambulatory Visit: Payer: Self-pay | Admitting: Internal Medicine

## 2020-10-18 ENCOUNTER — Encounter: Payer: Self-pay | Admitting: Family Medicine

## 2020-10-18 DIAGNOSIS — R600 Localized edema: Secondary | ICD-10-CM | POA: Insufficient documentation

## 2020-10-18 NOTE — Assessment & Plan Note (Signed)
Associated with arthritis and depression  Patient re-educated about  the importance of commitment to a  minimum of 150 minutes of exercise per week as able.  The importance of healthy food choices with portion control discussed, as well as eating regularly and within a 12 hour window most days. The need to choose "clean , green" food 50 to 75% of the time is discussed, as well as to make water the primary drink and set a goal of 64 ounces water daily.    Weight /BMI 10/12/2020 10/07/2020 09/23/2020  WEIGHT 186 lb 160 lb 187 lb  HEIGHT 4\' 11"  4\' 11"  4\' 11"   BMI 37.57 kg/m2 32.32 kg/m2 37.77 kg/m2  Some encounter information is confidential and restricted. Go to Review Flowsheets activity to see all data.

## 2020-10-18 NOTE — Assessment & Plan Note (Signed)
Not controlled , not suicidal or homicidal, will f/u with PCP, no med change at this visit

## 2020-10-18 NOTE — Assessment & Plan Note (Signed)
C/O new lower and upper ext numbness post op , to be followed by Neurosurgery

## 2020-10-18 NOTE — Assessment & Plan Note (Signed)
S/P recent surgery needing H/H and therapy, nursing to f/u on this as was ordered several weeks ago by PCP

## 2020-10-18 NOTE — Assessment & Plan Note (Signed)
Controlled, no change in medication  

## 2020-10-18 NOTE — Assessment & Plan Note (Signed)
Resume demadex and potassium, one month only will f/u with PCP

## 2020-10-18 NOTE — Assessment & Plan Note (Signed)
On eliquis

## 2020-10-26 ENCOUNTER — Ambulatory Visit (HOSPITAL_COMMUNITY): Payer: Medicaid Other | Admitting: Physical Therapy

## 2020-10-26 ENCOUNTER — Ambulatory Visit: Payer: Medicaid Other | Admitting: Internal Medicine

## 2020-10-26 ENCOUNTER — Other Ambulatory Visit: Payer: Self-pay | Admitting: Internal Medicine

## 2020-10-27 ENCOUNTER — Other Ambulatory Visit: Payer: Self-pay

## 2020-10-27 ENCOUNTER — Emergency Department (HOSPITAL_COMMUNITY): Payer: Medicaid Other

## 2020-10-27 ENCOUNTER — Emergency Department (HOSPITAL_COMMUNITY)
Admission: EM | Admit: 2020-10-27 | Discharge: 2020-10-27 | Disposition: A | Discharge status: Home / Self Care | Payer: Medicaid Other | Attending: Emergency Medicine | Admitting: Emergency Medicine

## 2020-10-27 ENCOUNTER — Encounter (HOSPITAL_COMMUNITY): Payer: Self-pay

## 2020-10-27 DIAGNOSIS — M79604 Pain in right leg: Secondary | ICD-10-CM | POA: Insufficient documentation

## 2020-10-27 DIAGNOSIS — Z87891 Personal history of nicotine dependence: Secondary | ICD-10-CM | POA: Diagnosis not present

## 2020-10-27 DIAGNOSIS — Z7951 Long term (current) use of inhaled steroids: Secondary | ICD-10-CM | POA: Diagnosis not present

## 2020-10-27 DIAGNOSIS — M545 Low back pain, unspecified: Secondary | ICD-10-CM | POA: Insufficient documentation

## 2020-10-27 DIAGNOSIS — Z7901 Long term (current) use of anticoagulants: Secondary | ICD-10-CM | POA: Insufficient documentation

## 2020-10-27 DIAGNOSIS — J4530 Mild persistent asthma, uncomplicated: Secondary | ICD-10-CM | POA: Diagnosis not present

## 2020-10-27 DIAGNOSIS — S39012A Strain of muscle, fascia and tendon of lower back, initial encounter: Secondary | ICD-10-CM

## 2020-10-27 DIAGNOSIS — I10 Essential (primary) hypertension: Secondary | ICD-10-CM | POA: Insufficient documentation

## 2020-10-27 DIAGNOSIS — J449 Chronic obstructive pulmonary disease, unspecified: Secondary | ICD-10-CM | POA: Insufficient documentation

## 2020-10-27 DIAGNOSIS — W010XXA Fall on same level from slipping, tripping and stumbling without subsequent striking against object, initial encounter: Secondary | ICD-10-CM | POA: Diagnosis not present

## 2020-10-27 DIAGNOSIS — M79605 Pain in left leg: Secondary | ICD-10-CM | POA: Insufficient documentation

## 2020-10-27 DIAGNOSIS — Z79899 Other long term (current) drug therapy: Secondary | ICD-10-CM | POA: Insufficient documentation

## 2020-10-27 DIAGNOSIS — Y92002 Bathroom of unspecified non-institutional (private) residence single-family (private) house as the place of occurrence of the external cause: Secondary | ICD-10-CM | POA: Insufficient documentation

## 2020-10-27 DIAGNOSIS — R519 Headache, unspecified: Secondary | ICD-10-CM | POA: Insufficient documentation

## 2020-10-27 DIAGNOSIS — S0990XA Unspecified injury of head, initial encounter: Secondary | ICD-10-CM | POA: Diagnosis not present

## 2020-10-27 DIAGNOSIS — D689 Coagulation defect, unspecified: Secondary | ICD-10-CM | POA: Diagnosis not present

## 2020-10-27 MED ORDER — HYDROMORPHONE HCL 1 MG/ML IJ SOLN
1.0000 mg | Freq: Once | INTRAMUSCULAR | Status: AC
  Administered 2020-10-27: 1 mg via INTRAMUSCULAR
  Filled 2020-10-27: qty 1

## 2020-10-27 MED ORDER — PREDNISONE 20 MG PO TABS: ORAL_TABLET | ORAL | 0 refills | Status: DC

## 2020-10-27 MED ORDER — OXYCODONE HCL 5 MG PO TABS
10.0000 mg | ORAL_TABLET | Freq: Once | ORAL | Status: AC
Start: 2020-10-27 — End: 2020-10-27
  Administered 2020-10-27: 10 mg via ORAL
  Filled 2020-10-27: qty 2

## 2020-10-27 MED ORDER — OXYCODONE HCL 5 MG PO TABS: 5.0000 mg | ORAL_TABLET | Freq: Four times a day (QID) | ORAL | 0 refills | Status: DC | PRN

## 2020-10-27 NOTE — Discharge Instructions (Signed)
It was our pleasure to provide your ER care today - we hope that you feel better.  Your imaging studies look good.   Rest. Avoid bending at waist or heavy lifting > 10 lbs for the next week. Try gentle massage and/or heat therapy to sore area.   Take prednisone as prescribed. You may also take oxycodone as need for pain - no driving for the next 8 hours, or if taking oxycodone.   Follow up with your back specialist in 1-2 weeks if symptoms fail to improve/resolve.  Return to ER if worse, new symptoms, fevers, intractable pain, numbness/weakness, or other concern.

## 2020-10-27 NOTE — ED Provider Notes (Signed)
[facility] Provider Note   CSN: [id number] Arrival date & time: 10/27/20  1607     History Chief Complaint  Patient presents with   Back Pain    Kathryn [name] is a 50 y.o. female.  Patient s/p fall a few days ago, states slipped in bathroom, fell onto back side/buttocks. Denies faintness or dizziness prior to fall. No loc w fall. States unsure if hit head, but no loc. Dull headache in day post fall, moderate. Is on anticoag therapy. C/o low back pain since fall. Pain acute onset, moderate-severe, constant, dull, occasionally radiates towards legs. No associated saddle area or leg numbness. No weakness. No problems w normal bowel or bladder function. Hx ddd, and notes prior low back surgery May 2022. Denies other pain or injury. No neck pain, no extremity pain or injury. Indicates has not taken anything for pain today, daughter drove to ED.   The history is provided by the patient and medical records.  Back Pain Associated symptoms: headaches   Associated symptoms: no abdominal pain, no chest pain, no fever, no numbness and no weakness       Past Medical History:  Diagnosis Date   Anxiety    Arthritis    Asthma    Bipolar disorder (HCC)    Chronic back pain    COPD (chronic obstructive pulmonary disease) (HCC)    Chronic bronchitis   Depression    GERD (gastroesophageal reflux disease)    Hypertension    Neuropathy    Osteoarthritis of left knee, 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   Sleep apnea 04/2020   GETTING A cpap   Suicidal ideation 12/24/2018    Patient Active Problem List   Diagnosis Date Noted   Bilateral leg edema 10/18/2020   Morbid obesity ([place]) 10/18/2020   S/P lumbar fusion 09/14/2020   Acute deep vein thrombosis (DVT) of proximal vein of lower extremity ([place]) 09/14/2020   Spondylolisthesis at L4-L5 level 03/29/2020   Insomnia 03/19/2020   Mild persistent asthma without complication 37/01/6268   Need for immunization against influenza  03/19/2020   Body mass index (BMI) 38.0-38.9, adult 03/19/2020   GERD (gastroesophageal reflux disease) 03/19/2020   Intermittent palpitations 12/17/2019   Snoring 12/17/2019   Excessive daytime sleepiness 12/17/2019   Nightmare disorder, 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   Enlarged uterus 02/20/2018   Screening for colorectal cancer 02/20/2018   Osteoarthritis of left knee, 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   MDD (major depressive disorder), recurrent episode, severe ([place]) 12/24/2017   Low back pain, unspecified 05/11/2017   Low vitamin B12 level 05/11/2017   Primary hypertension 01/11/2017    Past Surgical History:  Procedure Laterality Date   BACK SURGERY     DILATATION AND CURETTAGE/HYSTEROSCOPY WITH MINERVA N/A 06/09/2020   Procedure: DILATATION AND CURETTAGE/HYSTEROSCOPY WITH MINERVA;  Surgeon: Florian Buff, MD;  Location: [facility];  Service: Gynecology;  Laterality: N/A;   ESOPHAGOGASTRODUODENOSCOPY (EGD) WITH PROPOFOL N/A 12/25/2018   Procedure: ESOPHAGOGASTRODUODENOSCOPY (EGD) WITH PROPOFOL;  Surgeon: Rogene Houston, MD;  Location: [facility];  Service: Endoscopy;  Laterality: N/A;   PATELLA-FEMORAL ARTHROPLASTY Left 10/08/2018   Procedure: PATELLA-FEMORAL ARTHROPLASTY;  Surgeon: Marchia Bond, MD;  Location: [facility];  Service: Orthopedics;  Laterality: Left;   TUBAL LIGATION       OB History     Gravida  5   Para  3   Term  2   Preterm  1   AB  2   Living  3  SAB  1   IAB  1   Ectopic      Multiple      Live Births              Family History  Problem Relation Age of Onset   Gout Paternal Grandfather    Cirrhosis Paternal Grandfather    Hypertension Paternal Grandmother    Aneurysm Paternal Grandmother    Cirrhosis Maternal Grandmother    Cirrhosis Maternal Grandfather    Cancer Father    Cirrhosis Father    Cirrhosis Mother    Breast cancer Sister    Hypertension Sister    Bronchitis Daughter    Bronchitis Daughter    Asthma Son     Bronchitis Son    Migraines Neg Hx     Social History   Tobacco Use   Smoking status: Former    Pack years: 0.00    Types: Cigarettes    Quit date: 2016    Years since quitting: 6.5   Smokeless tobacco: Never   Tobacco comments:    wears nicotine patches  Vaping Use   Vaping Use: Never used  Substance Use Topics   Alcohol use: Not Currently   Drug use: Not Currently    Types: Cocaine    Comment: crack  last used 2016    Home Medications Prior to Admission medications   Medication Sig Start Date End Date Taking? Authorizing Provider  albuterol (PROVENTIL HFA;VENTOLIN HFA) 108 (90 Base) MCG/ACT inhaler Inhale 2 puffs into the lungs every 6 (six) hours as needed for wheezing or shortness of breath.    [provider]  albuterol (PROVENTIL) (2.5 MG/3ML) 0.083% nebulizer solution Take 2.5 mg by nebulization every 6 (six) hours as needed for wheezing or shortness of breath.    [provider]  amLODipine (NORVASC) 5 MG tablet TAKE 1 TABLET BY MOUTH ONCE A DAY FOR BLOOD PRESSURE. 10/26/20   Lindell Spar, MD  apixaban (ELIQUIS) 5 MG TABS tablet Take 1 tablet (5 mg total) by mouth 2 (two) times daily. 09/07/20   Costella, Vista Mink, PA-C  Boric Acid Vaginal 600 MG SUPP Place 600 mg vaginally as directed. Place one capsule inside the vagina at bedtime every other night for 3 doses. Use once a week at bedtime thereafter for foul odor. 05/28/20   Margarita Mail, PA-C  budesonide-formoterol (SYMBICORT) 80-4.5 MCG/ACT inhaler Inhale 2 puffs into the lungs 2 (two) times daily. 03/19/20   Lindell Spar, MD  celecoxib (CELEBREX) 200 MG capsule TAKE (1) CAPSULE BY MOUTH TWICE DAILY AS NEEDED. 10/26/20   Lindell Spar, MD  citalopram (CELEXA) 20 MG tablet Take 20 mg by mouth daily.    [provider]  cloNIDine (CATAPRES) 0.1 MG tablet TAKE 1 TABLET BY MOUTH ONCE DAILY FOR BLOOD PRESSURE AND ANXIETY. Patient taking differently: Take 0.1 mg by mouth daily. 06/02/20    Lindell Spar, MD  hydrOXYzine (VISTARIL) 50 MG capsule Take 1 capsule (50 mg total) by mouth daily as needed for anxiety (Insomnia). Patient taking differently: Take 50 mg by mouth at bedtime. 03/19/20   Lindell Spar, MD  methocarbamol (ROBAXIN) 500 MG tablet Take 500 mg by mouth in the morning and at bedtime.    [provider]  morphine (MSIR) 30 MG tablet Take 1 tablet (30 mg total) by mouth every 4 (four) hours as needed for severe pain. 09/02/20   Margarita Mail, PA-C  omeprazole (PRILOSEC) 20 MG capsule Take 1 capsule (  20 mg total) by mouth daily. 08/16/20   Lindell Spar, MD  ondansetron (ZOFRAN ODT) 4 MG disintegrating tablet Take 1 tablet (4 mg total) by mouth every 8 (eight) hours as needed for nausea or vomiting. 06/01/20   Truddie Hidden, MD  ondansetron (ZOFRAN ODT) 8 MG disintegrating tablet Take 1 tablet (8 mg total) by mouth every 8 (eight) hours as needed for nausea or vomiting. 06/09/20   Florian Buff, MD  oxyCODONE-acetaminophen (PERCOCET) 5-325 MG tablet Take 1 tablet by mouth every 6 (six) hours as needed for moderate pain. 10/07/20   Milton Ferguson, MD  potassium chloride SA (KLOR-CON) 20 MEQ tablet Take 1 tablet (20 mEq total) by mouth daily. 10/12/20   Fayrene Helper, MD  pregabalin (LYRICA) 75 MG capsule Take 1 capsule (75 mg total) by mouth 2 (two) times daily. 08/16/20   Lindell Spar, MD  Relugolix-Estradiol-Norethind (MYFEMBREE) 40-1-0.5 MG TABS Take 1 tablet by mouth daily. 08/06/20   Florian Buff, MD  tiZANidine (ZANAFLEX) 2 MG tablet TAKE 1 TABLET BY MOUTH TWICE DAILY AS NEEDED FOR MUSCLE SPASMS 10/19/20   Lindell Spar, MD  torsemide (DEMADEX) 5 MG tablet Take 1 tablet (5 mg total) by mouth daily. 08/16/20   Lindell Spar, MD  torsemide (DEMADEX) 5 MG tablet Take 1 tablet (5 mg total) by mouth daily. 10/12/20   Fayrene Helper, MD  Vitamin D, Ergocalciferol, (DRISDOL) 1.25 MG (50000 UNIT) CAPS capsule Take 50,000 Units by mouth every 7 (seven)  days.    [provider]    Allergies    Clonopin [clonazepam], Fish allergy, Flexeril [cyclobenzaprine hcl], Ibuprofen, Shellfish allergy, Tylenol [acetaminophen], Ace inhibitors, Cyclobenzaprine, Tramadol, and Trazodone and nefazodone  Review of Systems   Review of Systems  Constitutional:  Negative for fever.  HENT:  Negative for sore throat.   Eyes:  Negative for redness.  Respiratory:  Negative for shortness of breath.   Cardiovascular:  Negative for chest pain.  Gastrointestinal:  Negative for abdominal pain.  Genitourinary:  Negative for flank pain.  Musculoskeletal:  Positive for back pain. Negative for neck pain.  Skin:  Negative for wound.  Neurological:  Positive for headaches. Negative for weakness and numbness.  Hematological:  Does not bruise/bleed easily.  Psychiatric/Behavioral:  Negative for confusion.    Physical Exam Updated Vital Signs BP 111/77 (BP Location: Left Arm)   Pulse 66   Temp (!) 97.5 F (36.4 C) (Oral)   Resp 20   Ht 1.499 m (4\' 11" )   Wt 74.8 kg   LMP 06/07/2020   SpO2 99%   BMI 33.33 kg/m   Physical Exam Vitals and nursing note reviewed.  Constitutional:      Appearance: Normal appearance. She is well-developed.  HENT:     Head: Atraumatic.     Nose: Nose normal.     Mouth/Throat:     Mouth: Mucous membranes are moist.  Eyes:     General: No scleral icterus.    Conjunctiva/sclera: Conjunctivae normal.     Pupils: Pupils are equal, round, and reactive to light.  Neck:     Trachea: No tracheal deviation.  Cardiovascular:     Rate and Rhythm: Normal rate and regular rhythm.     Pulses: Normal pulses.     Heart sounds: Normal heart sounds. No murmur heard.   No friction rub. No gallop.  Pulmonary:     Effort: Pulmonary effort is normal. No respiratory distress.  Breath sounds: Normal breath sounds.  Abdominal:     General: There is no distension.     Palpations: Abdomen is soft.     Tenderness: There is no  abdominal tenderness.  Genitourinary:    Comments: No cva tenderness.  Musculoskeletal:        General: No swelling or tenderness.     Cervical back: Normal range of motion and neck supple. No rigidity. No muscular tenderness.     Comments: Mid lumbar tenderness, otherwise CTLS spine, non tender, aligned, no step off. No focal bony tenderness or pain on extremity exam.    Skin:    General: Skin is warm and dry.     Findings: No rash.  Neurological:     Mental Status: She is alert.     Comments: Alert, speech normal. Motor/sens grossly intact bil. Bil legs, stre 5/5. Sens intact. Steady gait.   Psychiatric:        Mood and Affect: Mood normal.    ED Results / Procedures / Treatments   Labs (all labs ordered are listed, but only abnormal results are displayed) Labs Reviewed - No data to display  EKG None  Radiology DG Lumbar Spine Complete  Result Date: 10/27/2020 CLINICAL DATA:  Fall, pain EXAM: LUMBAR SPINE - COMPLETE 4+ VIEW COMPARISON:  None. FINDINGS: Prior posterior fusion from L3-L5. No hardware complicating feature. No fracture or subluxation. SI joints symmetric and unremarkable. IMPRESSION: Postoperative changes.  No acute bony abnormality. Electronically Signed   By: Rolm Baptise M.D.   On: 10/27/2020 10:16   CT Head Wo Contrast  Result Date: 10/27/2020 CLINICAL DATA:  Head trauma.  Coagulopathy.  Fell today. EXAM: CT HEAD WITHOUT CONTRAST TECHNIQUE: Contiguous axial images were obtained from the base of the skull through the vertex without intravenous contrast. COMPARISON:  01/11/2017 FINDINGS: Brain: The brain shows a normal appearance without evidence of malformation, atrophy, old or acute small or large vessel infarction, mass lesion, hemorrhage, hydrocephalus or extra-axial collection. Vascular: No hyperdense vessel. No evidence of atherosclerotic calcification. Skull: Normal.  No traumatic finding.  No focal bone lesion. Sinuses/Orbits: Sinuses are clear. Orbits appear  normal. Mastoids are clear. Other: None significant IMPRESSION: Normal head CT.  No traumatic finding. Electronically Signed   By: Nelson Chimes M.D.   On: 10/27/2020 12:14    Procedures Procedures   Medications Ordered in ED Medications  HYDROmorphone (DILAUDID) injection 1 mg (has no administration in time range)    ED Course  I have reviewed the triage vital signs and the nursing notes.  Pertinent labs & imaging results that were available during my care of the patient were reviewed by me and considered in my medical decision making (see chart for details).    MDM Rules/Calculators/A&P                         No meds pta. Dilaudid 1 mg im.   Xrays ordered.  Reviewed nursing notes and prior charts for additional history.   Xrays reviewed/interpreted by me - no fx.   Given headache post trauma, and on eliquis, will gethead ct.   CT reviewed/interpreted by me - no hem.   Recheck pain improved, but persists, pt requests oxycodone. Oxycodone po.   Recheck pain improved.        Final Clinical Impression(s) / ED Diagnoses Final diagnoses:  None    Rx / DC Orders ED Discharge Orders     None  Lajean Saver, MD 10/27/20 1227

## 2020-10-27 NOTE — ED Triage Notes (Signed)
Pt presents to ED with complaints of lower back pain and bilateral leg pain since Saturday. Pt states she has had multiple falls, no help at home getting up, needs to see about getting assistance or into rehab. Hx back surgery.

## 2020-10-28 ENCOUNTER — Ambulatory Visit (HOSPITAL_COMMUNITY): Payer: Medicaid Other | Admitting: Physical Therapy

## 2020-10-28 ENCOUNTER — Telehealth: Payer: Self-pay

## 2020-10-28 NOTE — Telephone Encounter (Signed)
Transition Care Management Follow-up Telephone Call Date of discharge and from where: 10/27/2020-[facility] How have you been since you were released from the hospital? Doing better Any questions or concerns? No  Items Reviewed: Did the pt receive and understand the discharge instructions provided? Yes  Medications obtained and verified? Yes  Other? No  Any new allergies since your discharge? No  Dietary orders reviewed? N/A Do you have support at home? Yes   Home Care and Equipment/Supplies: Were home health services ordered? no If so, what is the name of the agency? N/A  Has the agency set up a time to come to the patient's home? no Were any new equipment or medical supplies ordered?  No What is the name of the medical supply agency? N/A Were you able to get the supplies/equipment? not applicable Do you have any questions related to the use of the equipment or supplies? No  Functional Questionnaire: (I = Independent and D = Dependent) ADLs: I  Bathing/Dressing- I  Meal Prep- I  Eating- I  Maintaining continence- I  Transferring/Ambulation- D  Managing Meds- I  Follow up appointments reviewed:  PCP Hospital f/u appt confirmed? Yes  Scheduled to see Dr. Posey Pronto on 11/18/2020 @ 9:40 AM. [facility] f/u appt confirmed? Yes  Scheduled to see [facility] on 10/28/2020 @ 2:00 pm. Are transportation arrangements needed? Yes  If their condition worsens, is the pt aware to call PCP or go to the Emergency Dept.? Yes Was the patient provided with contact information for the PCP's office or ED? Yes Was to pt encouraged to call back with questions or concerns? Yes

## 2020-10-28 NOTE — Telephone Encounter (Signed)
Advised pt that she would need to call her insurance company to find out who is covered

## 2020-11-02 ENCOUNTER — Encounter (HOSPITAL_COMMUNITY): Payer: Medicaid Other | Admitting: Physical Therapy

## 2020-11-02 ENCOUNTER — Other Ambulatory Visit: Payer: Self-pay | Admitting: Internal Medicine

## 2020-11-02 ENCOUNTER — Telehealth (HOSPITAL_COMMUNITY): Payer: Self-pay | Admitting: Physical Therapy

## 2020-11-02 NOTE — Telephone Encounter (Signed)
S/w patient she wants to cx today and will be here on Thursday - no reason given

## 2020-11-04 ENCOUNTER — Ambulatory Visit (HOSPITAL_COMMUNITY): Payer: Medicaid Other | Attending: Internal Medicine | Admitting: Physical Therapy

## 2020-11-04 NOTE — Telephone Encounter (Signed)
Pt did not show for appointment today.  No voicemail available for given number.  Teena Irani, PTA/CLT [phone]

## 2020-11-05 ENCOUNTER — Other Ambulatory Visit: Payer: Self-pay | Admitting: *Deleted

## 2020-11-05 NOTE — Patient Instructions (Signed)
Visit Information  [name]  - as a part of your Medicaid benefit, you are eligible for care management and care coordination services at no cost or copay. I was unable to reach you by phone today but would be happy to help you with your health related needs. Please feel free to call me @ [phone].   A member of the Managed Medicaid care management team will reach out to you again over the next 30 days.   Lurena Joiner RN, BSN [place]  [employer] manager

## 2020-11-05 NOTE — Patient Outreach (Signed)
Care Coordination  11/05/2020  TIKIA COWIN [birth year] [id number]   Medicaid Managed Care   Unsuccessful Outreach Note  11/05/2020 Name: CAMBRIA UMEDA MRN: [id number] DOB: 22-Nov-1970  Referred by: Lindell Spar, MD Reason for referral : High Risk Managed Medicaid (Unsuccessful RNCM initial outreach)   An unsuccessful telephone outreach was attempted today. The patient was referred to the case management team for assistance with care management and care coordination.   Follow Up Plan: A HIPAA compliant phone message was left for the patient providing contact information and requesting a return call.   Lurena Joiner RN, BSN [place]  [employer] manager

## 2020-11-08 ENCOUNTER — Other Ambulatory Visit: Payer: Self-pay | Admitting: Internal Medicine

## 2020-11-09 ENCOUNTER — Ambulatory Visit (HOSPITAL_COMMUNITY): Payer: Medicaid Other | Admitting: Physical Therapy

## 2020-11-09 ENCOUNTER — Telehealth (HOSPITAL_COMMUNITY): Payer: Self-pay | Admitting: Physical Therapy

## 2020-11-09 NOTE — Telephone Encounter (Signed)
Follow up call to let patient know that 7/28 appointment slot no longer available, and to call for scheduling next return visits.  5:46 PM, 11/09/20 Josue Hector PT DPT  Physical Therapist with [facility]  [phone]

## 2020-11-11 ENCOUNTER — Encounter (HOSPITAL_COMMUNITY): Payer: Medicaid Other | Admitting: Physical Therapy

## 2020-11-12 ENCOUNTER — Encounter (HOSPITAL_COMMUNITY): Payer: Self-pay | Admitting: Emergency Medicine

## 2020-11-12 ENCOUNTER — Emergency Department (HOSPITAL_COMMUNITY)
Admission: EM | Admit: 2020-11-12 | Discharge: 2020-11-12 | Disposition: A | Discharge status: Home / Self Care | Payer: Medicaid Other | Attending: Emergency Medicine | Admitting: Emergency Medicine

## 2020-11-12 ENCOUNTER — Emergency Department (HOSPITAL_COMMUNITY): Payer: Medicaid Other

## 2020-11-12 ENCOUNTER — Other Ambulatory Visit: Payer: Self-pay

## 2020-11-12 DIAGNOSIS — M7989 Other specified soft tissue disorders: Secondary | ICD-10-CM | POA: Diagnosis not present

## 2020-11-12 DIAGNOSIS — Z96652 Presence of left artificial knee joint: Secondary | ICD-10-CM | POA: Insufficient documentation

## 2020-11-12 DIAGNOSIS — Z20822 Contact with and (suspected) exposure to covid-19: Secondary | ICD-10-CM | POA: Diagnosis not present

## 2020-11-12 DIAGNOSIS — M791 Myalgia, unspecified site: Secondary | ICD-10-CM

## 2020-11-12 DIAGNOSIS — R609 Edema, unspecified: Secondary | ICD-10-CM | POA: Diagnosis not present

## 2020-11-12 DIAGNOSIS — J449 Chronic obstructive pulmonary disease, unspecified: Secondary | ICD-10-CM | POA: Insufficient documentation

## 2020-11-12 DIAGNOSIS — Z79899 Other long term (current) drug therapy: Secondary | ICD-10-CM | POA: Insufficient documentation

## 2020-11-12 DIAGNOSIS — J45909 Unspecified asthma, uncomplicated: Secondary | ICD-10-CM | POA: Diagnosis not present

## 2020-11-12 DIAGNOSIS — Z7901 Long term (current) use of anticoagulants: Secondary | ICD-10-CM | POA: Diagnosis not present

## 2020-11-12 DIAGNOSIS — Z7951 Long term (current) use of inhaled steroids: Secondary | ICD-10-CM | POA: Diagnosis not present

## 2020-11-12 DIAGNOSIS — R531 Weakness: Secondary | ICD-10-CM | POA: Diagnosis not present

## 2020-11-12 DIAGNOSIS — R0602 Shortness of breath: Secondary | ICD-10-CM | POA: Diagnosis not present

## 2020-11-12 DIAGNOSIS — I10 Essential (primary) hypertension: Secondary | ICD-10-CM | POA: Insufficient documentation

## 2020-11-12 DIAGNOSIS — Z87891 Personal history of nicotine dependence: Secondary | ICD-10-CM | POA: Insufficient documentation

## 2020-11-12 LAB — CBC WITH DIFFERENTIAL/PLATELET
Abs Immature Granulocytes: 0.07 10*3/uL (ref 0.00–0.07)
Basophils Absolute: 0 10*3/uL (ref 0.0–0.1)
Basophils Relative: 0 %
Eosinophils Absolute: 0.1 10*3/uL (ref 0.0–0.5)
Eosinophils Relative: 1 %
HCT: 35.6 % — ABNORMAL LOW (ref 36.0–46.0)
Hemoglobin: 11.3 g/dL — ABNORMAL LOW (ref 12.0–15.0)
Immature Granulocytes: 1 %
Lymphocytes Relative: 34 %
Lymphs Abs: 3.7 10*3/uL (ref 0.7–4.0)
MCH: 28.4 pg (ref 26.0–34.0)
MCHC: 31.7 g/dL (ref 30.0–36.0)
MCV: 89.4 fL (ref 80.0–100.0)
Monocytes Absolute: 0.9 10*3/uL (ref 0.1–1.0)
Monocytes Relative: 9 %
Neutro Abs: 6.2 10*3/uL (ref 1.7–7.7)
Neutrophils Relative %: 55 %
Platelets: 585 10*3/uL — ABNORMAL HIGH (ref 150–400)
RBC: 3.98 MIL/uL (ref 3.87–5.11)
RDW: 16.4 % — ABNORMAL HIGH (ref 11.5–15.5)
WBC: 11 10*3/uL — ABNORMAL HIGH (ref 4.0–10.5)
nRBC: 0 % (ref 0.0–0.2)

## 2020-11-12 LAB — COMPREHENSIVE METABOLIC PANEL
ALT: 18 U/L (ref 0–44)
AST: 19 U/L (ref 15–41)
Albumin: 3.5 g/dL (ref 3.5–5.0)
Alkaline Phosphatase: 70 U/L (ref 38–126)
Anion gap: 5 (ref 5–15)
BUN: 15 mg/dL (ref 6–20)
CO2: 24 mmol/L (ref 22–32)
Calcium: 8.7 mg/dL — ABNORMAL LOW (ref 8.9–10.3)
Chloride: 109 mmol/L (ref 98–111)
Creatinine, Ser: 0.79 mg/dL (ref 0.44–1.00)
GFR, Estimated: >60 mL/min (ref >60)
Glucose, Bld: 99 mg/dL (ref 70–99)
Potassium: 4 mmol/L (ref 3.5–5.1)
Sodium: 138 mmol/L (ref 135–145)
Total Bilirubin: 0.4 mg/dL (ref 0.3–1.2)
Total Protein: 7.2 g/dL (ref 6.5–8.1)

## 2020-11-12 LAB — URINALYSIS, ROUTINE W REFLEX MICROSCOPIC
Bilirubin Urine: NEGATIVE
Glucose, UA: NEGATIVE mg/dL
Hgb urine dipstick: NEGATIVE
Ketones, ur: NEGATIVE mg/dL
Leukocytes,Ua: NEGATIVE
Nitrite: NEGATIVE
Protein, ur: NEGATIVE mg/dL
Specific Gravity, Urine: 1.014 (ref 1.005–1.030)
pH: 7 (ref 5.0–8.0)

## 2020-11-12 LAB — RESP PANEL BY RT-PCR (FLU A&B, COVID) ARPGX2
Influenza A by PCR: NEGATIVE
Influenza B by PCR: NEGATIVE
SARS Coronavirus 2 by RT PCR: NEGATIVE

## 2020-11-12 MED ORDER — ONDANSETRON HCL 4 MG/2ML IJ SOLN
4.0000 mg | Freq: Once | INTRAMUSCULAR | Status: AC
  Administered 2020-11-12: 4 mg via INTRAVENOUS
  Filled 2020-11-12: qty 2

## 2020-11-12 MED ORDER — HYDROMORPHONE HCL 1 MG/ML IJ SOLN
0.5000 mg | Freq: Once | INTRAMUSCULAR | Status: AC
  Administered 2020-11-12: 0.5 mg via INTRAVENOUS
  Filled 2020-11-12: qty 1

## 2020-11-12 MED ORDER — HYDROCODONE-ACETAMINOPHEN 5-325 MG PO TABS: 1.0000 | ORAL_TABLET | Freq: Four times a day (QID) | ORAL | 0 refills | Status: DC | PRN

## 2020-11-12 MED ORDER — FUROSEMIDE 10 MG/ML IJ SOLN
40.0000 mg | Freq: Once | INTRAMUSCULAR | Status: AC
  Administered 2020-11-12: 40 mg via INTRAVENOUS
  Filled 2020-11-12: qty 4

## 2020-11-12 MED ORDER — SODIUM CHLORIDE 0.9 % IV BOLUS
1000.0000 mL | Freq: Once | INTRAVENOUS | Status: AC
  Administered 2020-11-12: 1000 mL via INTRAVENOUS

## 2020-11-12 NOTE — ED Triage Notes (Signed)
Pt c/o generalized body aches and fatigue since Wednesday.

## 2020-11-12 NOTE — Discharge Instructions (Addendum)
Get your fluid pill and start taking it.  Follow-up with your family doctor next week

## 2020-11-12 NOTE — ED Provider Notes (Signed)
[facility] EMERGENCY DEPARTMENT Provider Note   CSN: [id number] Arrival date & time: 11/12/20  0416     History Chief Complaint  Patient presents with   Generalized Body Aches    Kathryn [name] is a 50 y.o. female.  Patient complains of aching all over and swelling in her feet.  She has not been taking her fluid pill  The history is provided by the patient and medical records. No language interpreter was used.  Weakness Severity:  Moderate Onset quality:  Gradual Timing:  Constant Progression:  Worsening Chronicity:  Recurrent Context: not alcohol use   Relieved by:  Nothing Worsened by:  Nothing Ineffective treatments:  None tried Associated symptoms: no abdominal pain, no chest pain, no cough, no diarrhea, no frequency, no headaches and no seizures       Past Medical History:  Diagnosis Date   Anxiety    Arthritis    Asthma    Bipolar disorder ([place])    Chronic back pain    COPD (chronic obstructive pulmonary disease) (HCC)    Chronic bronchitis   Depression    GERD (gastroesophageal reflux disease)    Hypertension    Neuropathy    Osteoarthritis of left knee, 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   Sleep apnea 04/2020   GETTING A cpap   Suicidal ideation 12/24/2018    Patient Active Problem List   Diagnosis Date Noted   Bilateral leg edema 10/18/2020   Morbid obesity ([place]) 10/18/2020   S/P lumbar fusion 09/14/2020   Acute deep vein thrombosis (DVT) of proximal vein of lower extremity ([place]) 09/14/2020   Spondylolisthesis at L4-L5 level 03/29/2020   Insomnia 03/19/2020   Mild persistent asthma without complication [id number]   Need for immunization against influenza 03/19/2020   Body mass index (BMI) 38.0-38.9, adult 03/19/2020   GERD (gastroesophageal reflux disease) 03/19/2020   Intermittent palpitations 12/17/2019   Snoring 12/17/2019   Excessive daytime sleepiness 12/17/2019   Nightmare disorder, 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   Enlarged uterus 02/20/2018    Screening for colorectal cancer 02/20/2018   Osteoarthritis of left knee, 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   MDD (major depressive disorder), recurrent episode, severe ([place]) 12/24/2017   Low back pain, unspecified 05/11/2017   Low vitamin B12 level 05/11/2017   Primary hypertension 01/11/2017    Past Surgical History:  Procedure Laterality Date   BACK SURGERY     DILATATION AND CURETTAGE/HYSTEROSCOPY WITH MINERVA N/A 06/09/2020   Procedure: DILATATION AND CURETTAGE/HYSTEROSCOPY WITH MINERVA;  Surgeon: Florian Buff, MD;  Location: [facility];  Service: Gynecology;  Laterality: N/A;   ESOPHAGOGASTRODUODENOSCOPY (EGD) WITH PROPOFOL N/A 12/25/2018   Procedure: ESOPHAGOGASTRODUODENOSCOPY (EGD) WITH PROPOFOL;  Surgeon: Rogene [place], MD;  Location: [facility] SUITE;  Service: Endoscopy;  Laterality: N/A;   PATELLA-FEMORAL ARTHROPLASTY Left 10/08/2018   Procedure: PATELLA-FEMORAL ARTHROPLASTY;  Surgeon: Marchia Bond, MD;  Location: [facility];  Service: Orthopedics;  Laterality: Left;   TUBAL LIGATION       OB History     Gravida  5   Para  3   Term  2   Preterm  1   AB  2   Living  3      SAB  1   IAB  1   Ectopic      Multiple      Live Births              Family History  Problem Relation Age of Onset   Gout Paternal Grandfather    Cirrhosis  Paternal Grandfather    Hypertension Paternal Grandmother    Aneurysm Paternal Grandmother    Cirrhosis Maternal Grandmother    Cirrhosis Maternal Grandfather    Cancer Father    Cirrhosis Father    Cirrhosis Mother    Breast cancer Sister    Hypertension Sister    Bronchitis Daughter    Bronchitis Daughter    Asthma Son    Bronchitis Son    Migraines Neg Hx     Social History   Tobacco Use   Smoking status: Former    Types: Cigarettes    Quit date: 2016    Years since quitting: 6.5   Smokeless tobacco: Never   Tobacco comments:    wears nicotine patches  Vaping Use   Vaping Use: Never used  Substance Use Topics    Alcohol use: Not Currently   Drug use: Not Currently    Types: Cocaine    Comment: crack  last used 2016    Home Medications Prior to Admission medications   Medication Sig Start Date End Date Taking? Authorizing Provider  HYDROcodone-acetaminophen (NORCO/VICODIN) 5-325 MG tablet Take 1 tablet by mouth every 6 (six) hours as needed. 11/12/20  Yes Milton Ferguson, MD  albuterol (PROVENTIL HFA;VENTOLIN HFA) 108 (90 Base) MCG/ACT inhaler Inhale 2 puffs into the lungs every 6 (six) hours as needed for wheezing or shortness of breath.    [provider]  albuterol (PROVENTIL) (2.5 MG/3ML) 0.083% nebulizer solution Take 2.5 mg by nebulization every 6 (six) hours as needed for wheezing or shortness of breath.    [provider]  amLODipine (NORVASC) 5 MG tablet TAKE 1 TABLET BY MOUTH ONCE A DAY FOR BLOOD PRESSURE. 10/26/20   Lindell Spar, MD  apixaban (ELIQUIS) 5 MG TABS tablet Take 1 tablet (5 mg total) by mouth 2 (two) times daily. 09/07/20   Costella, Vista Mink, PA-C  Boric Acid Vaginal 600 MG SUPP Place 600 mg vaginally as directed. Place one capsule inside the vagina at bedtime every other night for 3 doses. Use once a week at bedtime thereafter for foul odor. Patient not taking: Reported on 10/27/2020 05/28/20   Margarita Mail, PA-C  budesonide-formoterol [facility]) 80-4.5 MCG/ACT inhaler Inhale 2 puffs into the lungs 2 (two) times daily. Patient not taking: Reported on 10/27/2020 03/19/20   Lindell Spar, MD  celecoxib (CELEBREX) 200 MG capsule TAKE (1) CAPSULE BY MOUTH TWICE DAILY AS NEEDED. 11/08/20   Lindell Spar, MD  citalopram (CELEXA) 20 MG tablet Take 20 mg by mouth daily.    [provider]  cloNIDine (CATAPRES) 0.1 MG tablet TAKE 1 TABLET BY MOUTH ONCE DAILY FOR BLOOD PRESSURE AND ANXIETY. Patient taking differently: Take 0.1 mg by mouth daily. 06/02/20   Lindell Spar, MD  hydrOXYzine (VISTARIL) 50 MG capsule Take 1 capsule (50 mg total) by mouth daily  as needed for anxiety (Insomnia). Patient taking differently: Take 50 mg by mouth at bedtime. 03/19/20   Lindell Spar, MD  methocarbamol (ROBAXIN) 500 MG tablet Take 500 mg by mouth in the morning and at bedtime. Patient not taking: Reported on 10/27/2020    [provider]  morphine (MSIR) 30 MG tablet Take 1 tablet (30 mg total) by mouth every 4 (four) hours as needed for severe pain. 09/02/20   Margarita Mail, PA-C  omeprazole (PRILOSEC) 20 MG capsule Take 1 capsule (20 mg total) by mouth daily. 08/16/20   Lindell Spar, MD  ondansetron (ZOFRAN ODT) 4 MG  disintegrating tablet Take 1 tablet (4 mg total) by mouth every 8 (eight) hours as needed for nausea or vomiting. 06/01/20   Truddie Hidden, MD  ondansetron (ZOFRAN ODT) 8 MG disintegrating tablet Take 1 tablet (8 mg total) by mouth every 8 (eight) hours as needed for nausea or vomiting. 06/09/20   Florian Buff, MD  oxyCODONE (ROXICODONE) 5 MG immediate release tablet Take 1 tablet (5 mg total) by mouth every 6 (six) hours as needed for severe pain. 10/27/20   Lajean Saver, MD  oxyCODONE-acetaminophen (PERCOCET) 5-325 MG tablet Take 1 tablet by mouth every 6 (six) hours as needed for moderate pain. 10/07/20   Milton Ferguson, MD  potassium chloride SA (KLOR-CON) 20 MEQ tablet Take 1 tablet (20 mEq total) by mouth daily. 10/12/20   Fayrene Helper, MD  predniSONE (DELTASONE) 20 MG tablet 3 po once a day for 2 days, then 2 po once a day for 3 days, then 1 po once a day for 3 days 10/27/20   Lajean Saver, MD  pregabalin (LYRICA) 75 MG capsule Take 1 capsule (75 mg total) by mouth 2 (two) times daily. 08/16/20   Lindell Spar, MD  Relugolix-Estradiol-Norethind (MYFEMBREE) 40-1-0.5 MG TABS Take 1 tablet by mouth daily. 08/06/20   Florian Buff, MD  tiZANidine (ZANAFLEX) 2 MG tablet TAKE 1 TABLET BY MOUTH TWICE DAILY AS NEEDED FOR MUSCLE SPASMS 11/02/20   Lindell Spar, MD  torsemide (DEMADEX) 5 MG tablet Take 1 tablet (5 mg total) by  mouth daily. 08/16/20   Lindell Spar, MD  torsemide (DEMADEX) 5 MG tablet Take 1 tablet (5 mg total) by mouth daily. Patient not taking: Reported on 10/27/2020 10/12/20   Fayrene Helper, MD  Vitamin D, Ergocalciferol, (DRISDOL) 1.25 MG (50000 UNIT) CAPS capsule Take 50,000 Units by mouth every 7 (seven) days.    [provider]    Allergies    Clonopin [clonazepam], Fish allergy, Flexeril [cyclobenzaprine hcl], Ibuprofen, Shellfish allergy, Tylenol [acetaminophen], Ace inhibitors, Cyclobenzaprine, Tramadol, and Trazodone and nefazodone  Review of Systems   Review of Systems  Constitutional:  Negative for appetite change and fatigue.  HENT:  Negative for congestion, ear discharge and sinus pressure.   Eyes:  Negative for discharge.  Respiratory:  Negative for cough.   Cardiovascular:  Negative for chest pain.  Gastrointestinal:  Negative for abdominal pain and diarrhea.  Genitourinary:  Negative for frequency and hematuria.  Musculoskeletal:  Negative for back pain.       Swelling in feet  Skin:  Negative for rash.  Neurological:  Positive for weakness. Negative for seizures and headaches.  Psychiatric/Behavioral:  Negative for hallucinations.    Physical Exam Updated Vital Signs BP 132/82   Pulse 73   Temp 98.3 F (36.8 C)   Resp 17   Ht '4\' 11"'$  (1.499 m)   Wt 84.4 kg   LMP 06/07/2020   SpO2 100%   BMI 37.57 kg/m   Physical Exam Vitals and nursing note reviewed.  Constitutional:      Appearance: She is well-developed.  HENT:     Head: Normocephalic.     Nose: Nose normal.  Eyes:     General: No scleral icterus.    Conjunctiva/sclera: Conjunctivae normal.  Neck:     Thyroid: No thyromegaly.  Cardiovascular:     Rate and Rhythm: Normal rate and regular rhythm.     Heart sounds: No murmur heard.   No friction rub. No gallop.  Pulmonary:  Breath sounds: No stridor. No wheezing or rales.  Chest:     Chest wall: No tenderness.  Abdominal:      General: There is no distension.     Tenderness: There is no abdominal tenderness. There is no rebound.  Musculoskeletal:        General: Normal range of motion.     Cervical back: Neck supple.     Comments: 2+ edema in feet  Lymphadenopathy:     Cervical: No cervical adenopathy.  Skin:    Findings: No erythema or rash.  Neurological:     Mental Status: She is alert and oriented to person, place, and time.     Motor: No abnormal muscle tone.     Coordination: Coordination normal.  Psychiatric:        Behavior: Behavior normal.    ED Results / Procedures / Treatments   Labs (all labs ordered are listed, but only abnormal results are displayed) Labs Reviewed  CBC WITH DIFFERENTIAL/PLATELET - Abnormal; Notable for the following components:      Result Value   WBC 11.0 (*)    Hemoglobin 11.3 (*)    HCT 35.6 (*)    RDW 16.4 (*)    Platelets 585 (*)    All other components within normal limits  COMPREHENSIVE METABOLIC PANEL - Abnormal; Notable for the following components:   Calcium 8.7 (*)    All other components within normal limits  URINALYSIS, ROUTINE W REFLEX MICROSCOPIC - Abnormal; Notable for the following components:   Color, Urine STRAW (*)    All other components within normal limits  RESP PANEL BY RT-PCR (FLU A&B, COVID) [id number]    EKG None  Radiology DG Chest Port 1 View  Result Date: 11/12/2020 CLINICAL DATA:  SOB this AM. Hx COPD, asthma, former smoker scratch EXAM: PORTABLE CHEST - 1 VIEW COMPARISON:  10/07/2020 FINDINGS: Lungs remain clear. Heart size upper limits normal for technique. No effusion.  No pneumothorax. Regional bones unremarkable. IMPRESSION: No acute cardiopulmonary disease. Electronically Signed   By: Lucrezia Europe M.D.   On: 11/12/2020 07:33    Procedures Procedures   Medications Ordered in ED Medications  furosemide (LASIX) injection 40 mg (has no administration in time range)  sodium chloride 0.9 % bolus 1,000 mL (1,000 mLs Intravenous New  Bag/Given 11/12/20 0858)  HYDROmorphone (DILAUDID) injection 0.5 mg (0.5 mg Intravenous Given 11/12/20 0859)  ondansetron (ZOFRAN) injection 4 mg (4 mg Intravenous Given 11/12/20 0856)    ED Course  I have reviewed the triage vital signs and the nursing notes.  Pertinent labs & imaging results that were available during my care of the patient were reviewed by me and considered in my medical decision making (see chart for details).    MDM Rules/Calculators/A&P                           Labs unremarkable.  Patient's edema improved some with Lasix.  She will start back her diuretic and is given some pain medicine for the myalgias will follow up with her PCP Final Clinical Impression(s) / ED Diagnoses Final diagnoses:  Myalgia    Rx / DC Orders ED Discharge Orders          Ordered    HYDROcodone-acetaminophen (NORCO/VICODIN) 5-325 MG tablet  Every 6 hours PRN        11/12/20 1100             Milton Ferguson, MD 11/15/20  1032  

## 2020-11-15 ENCOUNTER — Telehealth: Payer: Self-pay

## 2020-11-15 NOTE — Telephone Encounter (Signed)
Transition Care Management Unsuccessful Follow-up Telephone Call  Date of discharge and from where:  11/12/2020[facility]  Attempts:  1st Attempt  Reason for unsuccessful TCM follow-up call:  Unable to leave message

## 2020-11-16 ENCOUNTER — Other Ambulatory Visit: Payer: Self-pay

## 2020-11-16 ENCOUNTER — Ambulatory Visit (HOSPITAL_COMMUNITY): Payer: Medicaid Other | Attending: Internal Medicine

## 2020-11-16 ENCOUNTER — Ambulatory Visit (HOSPITAL_COMMUNITY): Payer: Medicaid Other | Admitting: Physical Therapy

## 2020-11-16 DIAGNOSIS — M545 Low back pain, unspecified: Secondary | ICD-10-CM | POA: Diagnosis not present

## 2020-11-16 DIAGNOSIS — M25562 Pain in left knee: Secondary | ICD-10-CM | POA: Diagnosis not present

## 2020-11-16 DIAGNOSIS — R2689 Other abnormalities of gait and mobility: Secondary | ICD-10-CM

## 2020-11-16 DIAGNOSIS — M6281 Muscle weakness (generalized): Secondary | ICD-10-CM | POA: Diagnosis not present

## 2020-11-16 NOTE — Therapy (Addendum)
[facility] [address] [place], [place], [address] Phone: [phone]   Fax:  [phone]  Physical Therapy Treatment and Discharge Note  Patient Details  Name: Kathryn [name] MRN: [id number] Date of Birth: Dec 25, 1970 Referring Provider (PT): Ihor Dow MD   PHYSICAL THERAPY DISCHARGE SUMMARY  Visits from Start of Care: 2  Current functional level related to goals / functional outcomes: Unable to reassess   Remaining deficits: Unable to reassess   Education / Equipment: Unable to reassess  Patient agrees to discharge. Patient goals were not met. Patient is being discharged due to not returning since the last visit.  9:46 AM, 12/27/20 Jerene Pitch, DPT Physical Therapy with [facility]  [phone] office    Encounter Date: 11/16/2020   PT End of Session - 11/16/20 1032     Visit Number 2    Number of Visits 12    Date for PT Re-Evaluation 11/24/20    Authorization Type Medicaid healthy blue    Authorization Time Period Check auth    PT Start Time 1030    PT Stop Time 1115    PT Time Calculation (min) 45 min    Equipment Utilized During Treatment Gait belt;Back brace    Activity Tolerance Patient tolerated treatment well    Behavior During Therapy WFL for tasks assessed/performed             Past Medical History:  Diagnosis Date   Anxiety    Arthritis    Asthma    Bipolar disorder ([place])    Chronic back pain    COPD (chronic obstructive pulmonary disease) ([place])    Chronic bronchitis   Depression    GERD (gastroesophageal reflux disease)    Hypertension    Neuropathy    Osteoarthritis of left knee, 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   Sleep apnea 04/2020   GETTING A cpap   Suicidal ideation 12/24/2018    Past Surgical History:  Procedure Laterality Date   BACK SURGERY     DILATATION AND CURETTAGE/HYSTEROSCOPY WITH MINERVA N/A 06/09/2020   Procedure: DILATATION AND  CURETTAGE/HYSTEROSCOPY WITH MINERVA;  Surgeon: Florian Buff, MD;  Location: [facility];  Service: Gynecology;  Laterality: N/A;   ESOPHAGOGASTRODUODENOSCOPY (EGD) WITH PROPOFOL N/A 12/25/2018   Procedure: ESOPHAGOGASTRODUODENOSCOPY (EGD) WITH PROPOFOL;  Surgeon: Rogene [place], MD;  [facility];  Service: Endoscopy;  Laterality: N/A;   PATELLA-FEMORAL ARTHROPLASTY Left 10/08/2018   Procedure: PATELLA-FEMORAL ARTHROPLASTY;  Surgeon: Marchia Bond, MD;  [facility];  Service: Orthopedics;  Laterality: Left;   TUBAL LIGATION      There were no vitals filed for this visit.   Subjective Assessment - 11/16/20 1035     Subjective Patient reports she has been experiencing multiple falls in the past 2 months and notes continued numbness in her LLE extending from buttocks, to left calf, heel. Pt notes difficulty with curling/flexing the toes on her left foot since her falling episodes. Pt reports continued unsteadiness in walking and dependent on RW at this time.    Pertinent History s/p lumbar fusion    Limitations Lifting;Standing;Walking;House hold activities    Patient Stated Goals Get walking again without walker    Currently in Pain? Yes    Pain Score 5     Pain Location Generalized    Pain Orientation Left;Lower    Pain Descriptors / Indicators Aching                [facility] PT Assessment -  11/16/20 0001       Assessment   Medical Diagnosis s/p Lumbar fusion    Referring Provider (PT) Ihor Dow MD    Onset Date/Surgical Date 08/20/20                           [facility] Adult PT Treatment/Exercise - 11/16/20 0001       Ambulation/Gait   Ambulation/Gait Yes    Ambulation/Gait Assistance 5: Supervision    Ambulation Distance (Feet) 226 Feet    Assistive device Rolling walker    Gait Pattern Poor foot clearance - left;Trendelenburg    Ambulation Surface Level;Indoor      Lumbar Exercises: Supine   Ab Set 15 reps;3 seconds    Glut Set 15 reps     Other Supine Lumbar Exercises pillow squeeze 2x10                    PT Education - 11/16/20 1101     Education Details recommend to patient that she seek MD advice regarding her multiple falls and continued LLE dysfunction    Person(s) Educated Patient    Methods Explanation    Comprehension Verbalized understanding              PT Short Term Goals - 10/13/20 1339       PT SHORT TERM GOAL #1   Title Patient will be independent with initial HEP and self-management strategies to improve functional outcomes    Time 3    Period Weeks    Status New    Target Date 11/03/20               PT Long Term Goals - 10/13/20 1625       PT LONG TERM GOAL #1   Title Patient will have equal to or > 4/5 MMT throughout BLE to improve ability to perform functional mobility, stair ambulation and ADLs.    Time 6    Period Weeks    Status New    Target Date 11/24/20      PT LONG TERM GOAL #2   Title Patient will be able to ambulate at least 275 feet during 2MWT with LRAD to demonstrate improved ability to perform functional mobility and associated tasks.    Time 6    Period Weeks    Status New    Target Date 11/24/20      PT LONG TERM GOAL #3   Title Patient will be able to stand > 15 minutes with pain not to exceed 3/10 in lumbar to improve ability to perform cooking and grooming ADLs.    Time 6    Period Weeks    Status New    Target Date 11/24/20      PT LONG TERM GOAL #4   Title Patient will report at least 70% overall improvement in subjective complaint to indicate improvement in ability to perform ADLs.    Time 6    Period Weeks    Status New    Target Date 11/24/20                   Plan - 11/16/20 1113     Clinical Impression Statement Returns to clinic following prolonged absence which she reports is due to multiple falls and hospitalizations. Demonstrates continued weakness LLE > RLE and gait dysfunction with LLE stance control issues and  unsteadiness on feet. Continued sessions indicated to improve strength, balance,  motor control, and activity tolerance    Examination-Activity Limitations Squat;Stairs;Stand;Transfers;Lift;Locomotion Level    Examination-Participation Restrictions Laundry;Yard Work;Cleaning;Community Activity    Stability/Clinical Decision Making Stable/Uncomplicated    Rehab Potential Good    PT Frequency 2x / week    PT Duration 6 weeks    PT Treatment/Interventions ADLs/Self Care Home Management;Aquatic Therapy;Biofeedback;Cryotherapy;Gait training;Orthotic Fit/Training;Stair training;Functional mobility training;Electrical Stimulation;Iontophoresis 41m/ml Dexamethasone;Therapeutic activities;Therapeutic exercise;Moist Heat;Balance training;Traction;Manual lymph drainage;Manual techniques;Vasopneumatic Device;Splinting;Energy conservation;Taping;Joint Manipulations;Dry needling;Passive range of motion;Patient/family education;DME Instruction;Contrast Bath;Neuromuscular re-education;Compression bandaging;Scar mobilization;Spinal Manipulations;Visual/perceptual remediation/compensation;Ultrasound;Parrafin;Fluidtherapy    PT Next Visit Plan Review goals and HEP. Progress core, glute strength and lumbar mobility as tolerated. Progress to balance when able. Bridge, ab march, LTR, SLR, sit to stand    PT Home Exercise Plan Eval: glute set, ab set, band clamshell (supine)    Consulted and Agree with Plan of Care Patient             Patient will benefit from skilled therapeutic intervention in order to improve the following deficits and impairments:  Abnormal gait, Decreased endurance, Hypomobility, Impaired sensation, Decreased activity tolerance, Decreased balance, Decreased mobility, Difficulty walking, Pain, Decreased strength, Decreased range of motion, Improper body mechanics  Visit Diagnosis: Low back pain, unspecified back pain laterality, unspecified chronicity, unspecified whether sciatica present  Muscle  weakness (generalized)  Other abnormalities of gait and mobility  Left knee pain, unspecified chronicity     Problem List Patient Active Problem List   Diagnosis Date Noted   Bilateral leg edema 10/18/2020   Morbid obesity ([place] 10/18/2020   S/P lumbar fusion 09/14/2020   Acute deep vein thrombosis (DVT) of proximal vein of lower extremity ([place] 09/14/2020   Spondylolisthesis at L4-L5 level 03/29/2020   Insomnia 03/19/2020   Mild persistent asthma without complication 195/74/7340  Need for immunization against influenza 03/19/2020   Body mass index (BMI) 38.0-38.9, adult 03/19/2020   GERD (gastroesophageal reflux disease) 03/19/2020   Intermittent palpitations 12/17/2019   Snoring 12/17/2019   Excessive daytime sleepiness 12/17/2019   Nightmare disorder, 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   Enlarged uterus 02/20/2018   Screening for colorectal cancer 02/20/2018   Osteoarthritis of left knee, 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   MDD (major depressive disorder), recurrent episode, severe ([place] 12/24/2017   Low back pain, unspecified 05/11/2017   Low vitamin B12 level 05/11/2017   Primary hypertension 01/11/2017   11:15 AM, 11/16/20 M. KSherlyn Lees PT, DPT Physical Therapist- [place] Office Number: [phone]  [place] [address]Phone: [phone]  Fax:  [phone] Name: Kathryn NIXMRN: [id number] of Birth: [birth year]

## 2020-11-16 NOTE — Telephone Encounter (Signed)
Transition Care Management Unsuccessful Follow-up Telephone Call  Date of discharge and from where:  11[facility]12/2020-[facility]  Attempts:  2nd Attempt  Reason for unsuccessful TCM follow-up call:  Left voice message

## 2020-11-17 NOTE — Telephone Encounter (Signed)
Transition Care Management Unsuccessful Follow-up Telephone Call  Date of discharge and from where:  11/12/2020[facility] ED   Attempts:  3rd Attempt  Reason for unsuccessful TCM follow-up call:  Voice mail full

## 2020-11-18 ENCOUNTER — Encounter: Payer: Self-pay | Admitting: Internal Medicine

## 2020-11-18 ENCOUNTER — Other Ambulatory Visit: Payer: Self-pay

## 2020-11-18 ENCOUNTER — Encounter (INDEPENDENT_AMBULATORY_CARE_PROVIDER_SITE_OTHER): Payer: Self-pay | Admitting: *Deleted

## 2020-11-18 ENCOUNTER — Ambulatory Visit: Payer: Medicaid Other | Admitting: Internal Medicine

## 2020-11-18 ENCOUNTER — Ambulatory Visit (HOSPITAL_COMMUNITY): Payer: Medicaid Other | Admitting: Physical Therapy

## 2020-11-18 ENCOUNTER — Other Ambulatory Visit: Payer: Self-pay | Admitting: Internal Medicine

## 2020-11-18 VITALS — BP 138/88 | HR 91 | Resp 18 | Ht 59.0 in | Wt 195.4 lb

## 2020-11-18 DIAGNOSIS — R600 Localized edema: Secondary | ICD-10-CM | POA: Diagnosis not present

## 2020-11-18 DIAGNOSIS — G4700 Insomnia, unspecified: Secondary | ICD-10-CM | POA: Diagnosis not present

## 2020-11-18 DIAGNOSIS — Z23 Encounter for immunization: Secondary | ICD-10-CM | POA: Diagnosis not present

## 2020-11-18 DIAGNOSIS — I824Y9 Acute embolism and thrombosis of unspecified deep veins of unspecified proximal lower extremity: Secondary | ICD-10-CM

## 2020-11-18 DIAGNOSIS — Z0001 Encounter for general adult medical examination with abnormal findings: Secondary | ICD-10-CM

## 2020-11-18 DIAGNOSIS — Z1211 Encounter for screening for malignant neoplasm of colon: Secondary | ICD-10-CM | POA: Diagnosis not present

## 2020-11-18 DIAGNOSIS — F332 Major depressive disorder, recurrent severe without psychotic features: Secondary | ICD-10-CM | POA: Diagnosis not present

## 2020-11-18 DIAGNOSIS — G629 Polyneuropathy, unspecified: Secondary | ICD-10-CM | POA: Diagnosis not present

## 2020-11-18 DIAGNOSIS — I10 Essential (primary) hypertension: Secondary | ICD-10-CM | POA: Diagnosis not present

## 2020-11-18 DIAGNOSIS — Z981 Arthrodesis status: Secondary | ICD-10-CM

## 2020-11-18 DIAGNOSIS — K644 Residual hemorrhoidal skin tags: Secondary | ICD-10-CM | POA: Diagnosis not present

## 2020-11-18 DIAGNOSIS — Z0000 Encounter for general adult medical examination without abnormal findings: Secondary | ICD-10-CM

## 2020-11-18 MED ORDER — HYDROXYZINE PAMOATE 50 MG PO CAPS: 50.0000 mg | ORAL_CAPSULE | Freq: Every day | ORAL | 5 refills | Status: DC | PRN

## 2020-11-18 MED ORDER — FUROSEMIDE 20 MG PO TABS: 20.0000 mg | ORAL_TABLET | Freq: Every day | ORAL | 1 refills | Status: DC

## 2020-11-18 MED ORDER — CITALOPRAM HYDROBROMIDE 20 MG PO TABS: 20.0000 mg | ORAL_TABLET | Freq: Every day | ORAL | 1 refills | Status: DC

## 2020-11-18 MED ORDER — HYDROCORTISONE (PERIANAL) 2.5 % EX CREA
1.0000 | TOPICAL_CREAM | Freq: Two times a day (BID) | CUTANEOUS | 0 refills | Status: DC
Start: 2020-11-18 — End: 2021-10-22

## 2020-11-18 NOTE — Progress Notes (Signed)
Established Patient Office Visit  Subjective:  Patient ID: Kathryn [name], female    DOB: 10-Dec-1970  Age: 50 y.o. MRN: [id number]  CC:  Chief Complaint  Patient presents with   Follow-up    6 week follow up pt is taking PT this is helping but not helping she was in ER 11-13-20 for pain and swelling in feet and legs was told to discuss lasix with pcp she also is having numbness and shooting pains from her feet to her legs and hips     HPI Kathryn [name] is a 50 year old female with past medical history of hypertension, asthma, GERD, insomnia, mild sleep apnea, depression, chronic low back pain and knee pain, and obesity who presents for follow up of her chronic medical conditions.  She has been taking Eliquis for DVT. Denies any active bleeding.  Her leg swelling is better with PT, but continues to bother her. She has run out of Demadex. Denies any dyspnea or palpitations.  She has run out of Celexa and has been feeling depressed. She also admits having suicidal thoughts, but denies any plan. Does not have access to firearms. She acknowledges how to contact suicide prevention hotline. She follows up with Psychiatry - [facility].  She still has difficulty walking and uses walker for it. Sees spine surgery for follow up of posterior lumbar interbody fusion (L3-L4).  She c/o having rectal pain from external hemorrhoids. She also reports noticing blood while wiping. Denies melena or hematochezia.  Past Medical History:  Diagnosis Date   Anxiety    Arthritis    Asthma    Bipolar disorder ([place])    Chronic back pain    COPD (chronic obstructive pulmonary disease) (HCC)    Chronic bronchitis   Depression    GERD (gastroesophageal reflux disease)    Hypertension    Neuropathy    Osteoarthritis of left knee, 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   Sleep apnea 04/2020   GETTING A cpap   Suicidal ideation 12/24/2018    Past Surgical History:  Procedure Laterality Date   BACK SURGERY     DILATATION  AND CURETTAGE/HYSTEROSCOPY WITH MINERVA N/A 06/09/2020   Procedure: DILATATION AND CURETTAGE/HYSTEROSCOPY WITH MINERVA;  Surgeon: Florian Buff, MD;  Location: [facility];  Service: Gynecology;  Laterality: N/A;   ESOPHAGOGASTRODUODENOSCOPY (EGD) WITH PROPOFOL N/A 12/25/2018   Procedure: ESOPHAGOGASTRODUODENOSCOPY (EGD) WITH PROPOFOL;  Surgeon: Rogene Houston, MD;  Location: [facility];  Service: Endoscopy;  Laterality: N/A;   PATELLA-FEMORAL ARTHROPLASTY Left 10/08/2018   Procedure: PATELLA-FEMORAL ARTHROPLASTY;  Surgeon: Marchia Bond, MD;  Location: [facility];  Service: Orthopedics;  Laterality: Left;   TUBAL LIGATION      Family History  Problem Relation Age of Onset   Gout Paternal Grandfather    Cirrhosis Paternal Grandfather    Hypertension Paternal Grandmother    Aneurysm Paternal Grandmother    Cirrhosis Maternal Grandmother    Cirrhosis Maternal Grandfather    Cancer Father    Cirrhosis Father    Cirrhosis Mother    Breast cancer Sister    Hypertension Sister    Bronchitis Daughter    Bronchitis Daughter    Asthma Son    Bronchitis Son    Migraines Neg Hx     Social History   Socioeconomic History   Marital status: Single    Spouse name: Not on file   Number of children: Not on file   Years of education: Not on file   Highest education level: Not on  file  Occupational History   Not on file  Tobacco Use   Smoking status: Former    Types: Cigarettes    Quit date: 2016    Years since quitting: 6.5   Smokeless tobacco: Never   Tobacco comments:    wears nicotine patches  Vaping Use   Vaping Use: Never used  Substance and Sexual Activity   Alcohol use: Not Currently   Drug use: Not Currently    Types: Cocaine    Comment: crack  last used 2016   Sexual activity: Yes    Birth control/protection: Surgical    Comment: tubal  Other Topics Concern   Not on file  Social History Narrative   R handed    Lives with boyfriend   1 Cup of caffeine daily     Social Determinants of Health   Financial Resource Strain: Not on file  Food Insecurity: Not on file  Transportation Needs: Not on file  Physical Activity: Not on file  Stress: Not on file  Social Connections: Not on file  Intimate Partner Violence: Not on file    Outpatient Medications Prior to Visit  Medication Sig Dispense Refill   amLODipine (NORVASC) 5 MG tablet TAKE 1 TABLET BY MOUTH ONCE A DAY FOR BLOOD PRESSURE. 90 tablet 0   apixaban (ELIQUIS) 5 MG TABS tablet Take 1 tablet (5 mg total) by mouth 2 (two) times daily. 60 tablet    celecoxib (CELEBREX) 200 MG capsule TAKE (1) CAPSULE BY MOUTH TWICE DAILY AS NEEDED. 20 capsule 0   cloNIDine (CATAPRES) 0.1 MG tablet TAKE 1 TABLET BY MOUTH ONCE DAILY FOR BLOOD PRESSURE AND ANXIETY. (Patient taking differently: Take 0.1 mg by mouth daily.) 90 tablet 0   Cyanocobalamin (VITAMIN B 12 PO) Take by mouth.     omeprazole (PRILOSEC) 20 MG capsule Take 1 capsule (20 mg total) by mouth daily. 90 capsule 0   potassium chloride SA (KLOR-CON) 20 MEQ tablet Take 1 tablet (20 mEq total) by mouth daily. 30 tablet 0   pregabalin (LYRICA) 75 MG capsule Take 1 capsule (75 mg total) by mouth 2 (two) times daily. 60 capsule 5   albuterol (PROVENTIL HFA;VENTOLIN HFA) 108 (90 Base) MCG/ACT inhaler Inhale 2 puffs into the lungs every 6 (six) hours as needed for wheezing or shortness of breath. (Patient not taking: Reported on 11/18/2020)     albuterol (PROVENTIL) (2.5 MG/3ML) 0.083% nebulizer solution Take 2.5 mg by nebulization every 6 (six) hours as needed for wheezing or shortness of breath. (Patient not taking: Reported on 11/18/2020)     budesonide-formoterol (SYMBICORT) 80-4.5 MCG/ACT inhaler Inhale 2 puffs into the lungs 2 (two) times daily. (Patient not taking: No sig reported) 1 each 5   Relugolix-Estradiol-Norethind (MYFEMBREE) 40-1-0.5 MG TABS Take 1 tablet by mouth daily. (Patient not taking: Reported on 11/18/2020) 30 tablet 11   Boric Acid Vaginal  600 MG SUPP Place 600 mg vaginally as directed. Place one capsule inside the vagina at bedtime every other night for 3 doses. Use once a week at bedtime thereafter for foul odor. (Patient not taking: No sig reported) 30 suppository 0   citalopram (CELEXA) 20 MG tablet Take 20 mg by mouth daily. (Patient not taking: Reported on 11/18/2020)     HYDROcodone-acetaminophen (NORCO/VICODIN) 5-325 MG tablet Take 1 tablet by mouth every 6 (six) hours as needed. (Patient not taking: Reported on 11/18/2020) 12 tablet 0   hydrOXYzine (VISTARIL) 50 MG capsule Take 1 capsule (50 mg total) by mouth daily  as needed for anxiety (Insomnia). (Patient not taking: Reported on 11/18/2020) 30 capsule 5   methocarbamol (ROBAXIN) 500 MG tablet Take 500 mg by mouth in the morning and at bedtime. (Patient not taking: No sig reported)     morphine (MSIR) 30 MG tablet Take 1 tablet (30 mg total) by mouth every 4 (four) hours as needed for severe pain. (Patient not taking: Reported on 11/18/2020) 30 tablet 0   ondansetron (ZOFRAN ODT) 4 MG disintegrating tablet Take 1 tablet (4 mg total) by mouth every 8 (eight) hours as needed for nausea or vomiting. (Patient not taking: Reported on 11/18/2020) 20 tablet 0   ondansetron (ZOFRAN ODT) 8 MG disintegrating tablet Take 1 tablet (8 mg total) by mouth every 8 (eight) hours as needed for nausea or vomiting. (Patient not taking: Reported on 11/18/2020) 8 tablet 0   oxyCODONE (ROXICODONE) 5 MG immediate release tablet Take 1 tablet (5 mg total) by mouth every 6 (six) hours as needed for severe pain. (Patient not taking: Reported on 11/18/2020) 15 tablet 0   oxyCODONE-acetaminophen (PERCOCET) 5-325 MG tablet Take 1 tablet by mouth every 6 (six) hours as needed for moderate pain. (Patient not taking: Reported on 11/18/2020) 20 tablet 0   predniSONE (DELTASONE) 20 MG tablet 3 po once a day for 2 days, then 2 po once a day for 3 days, then 1 po once a day for 3 days (Patient not taking: Reported on 11/18/2020) 15  tablet 0   tiZANidine (ZANAFLEX) 2 MG tablet TAKE 1 TABLET BY MOUTH TWICE DAILY AS NEEDED FOR MUSCLE SPASMS (Patient not taking: Reported on 11/18/2020) 30 tablet 0   torsemide (DEMADEX) 5 MG tablet Take 1 tablet (5 mg total) by mouth daily. (Patient not taking: Reported on 11/18/2020) 30 tablet 0   torsemide (DEMADEX) 5 MG tablet Take 1 tablet (5 mg total) by mouth daily. (Patient not taking: No sig reported) 30 tablet 0   Vitamin D, Ergocalciferol, (DRISDOL) 1.25 MG (50000 UNIT) CAPS capsule Take 50,000 Units by mouth every 7 (seven) days. (Patient not taking: Reported on 11/18/2020)     No facility-administered medications prior to visit.    Allergies  Allergen Reactions   Clonopin [Clonazepam] Anaphylaxis    Swelling of the tongue and mouth.    Fish Allergy Anaphylaxis, Shortness Of Breath and Swelling   Flexeril [Cyclobenzaprine Hcl] Shortness Of Breath   Ibuprofen Anaphylaxis, Hives and Other (See Comments)   Shellfish Allergy Anaphylaxis   Tylenol [Acetaminophen] Anaphylaxis   Ace Inhibitors Cough   Cyclobenzaprine    Tramadol Nausea And Vomiting    Upset stomach   Trazodone And Nefazodone Hives    ROS Review of Systems  Constitutional:  Negative for chills and fever.  HENT:  Negative for congestion, sinus pressure, sinus pain and sore throat.   Eyes:  Negative for pain and discharge.  Respiratory:  Negative for cough and shortness of breath.   Cardiovascular:  Negative for chest pain and palpitations.  Gastrointestinal:  Positive for rectal pain. Negative for abdominal pain, constipation, diarrhea, nausea and vomiting.  Endocrine: Negative for polydipsia and polyuria.  Genitourinary:  Positive for pelvic pain. Negative for dysuria, hematuria, menstrual problem, vaginal bleeding and vaginal discharge.  Musculoskeletal:  Positive for arthralgias and back pain. Negative for neck pain and neck stiffness.  Skin:  Negative for rash.  Neurological:  Negative for dizziness and  weakness.  Psychiatric/Behavioral:  Positive for dysphoric mood, sleep disturbance and suicidal ideas. Negative for agitation and behavioral problems.  Objective:    Physical Exam Vitals reviewed.  Constitutional:      General: She is not in acute distress.    Appearance: She is obese. She is not diaphoretic.  HENT:     Head: Normocephalic and atraumatic.     Nose: Nose normal.     Mouth/Throat:     Mouth: Mucous membranes are moist.  Eyes:     General: No scleral icterus.    Extraocular Movements: Extraocular movements intact.  Cardiovascular:     Rate and Rhythm: Normal rate and regular rhythm.     Pulses: Normal pulses.     Heart sounds: Normal heart sounds. No murmur heard. Pulmonary:     Breath sounds: Normal breath sounds. No wheezing or rales.  Abdominal:     Palpations: Abdomen is soft.     Tenderness: There is no abdominal tenderness. There is no guarding or rebound.  Musculoskeletal:     Cervical back: Neck supple. No tenderness.     Right lower leg: No edema.     Left lower leg: No edema.  Skin:    General: Skin is warm.     Findings: No rash.  Neurological:     General: No focal deficit present.     Mental Status: She is alert and oriented to person, place, and time.     Sensory: No sensory deficit.     Motor: No weakness.  Psychiatric:        Mood and Affect: Mood normal.        Behavior: Behavior normal.    BP 138/88 (BP Location: Left Arm, Patient Position: Sitting, Cuff Size: Normal)   Pulse 91   Resp 18   Ht '4\' 11"'  (1.499 m)   Wt 195 lb 6.4 oz (88.6 kg)   LMP 06/07/2020   SpO2 100%   BMI 39.47 kg/m  Wt Readings from Last 3 Encounters:  11/18/20 195 lb 6.4 oz (88.6 kg)  11/12/20 186 lb (84.4 kg)  10/27/20 165 lb (74.8 kg)     Health Maintenance Due  Topic Date Due   COLONOSCOPY (Pts 45-39yr Insurance coverage will need to be confirmed)  Never done   COVID-19 Vaccine (3 - Booster for Moderna series) 03/29/2020   INFLUENZA VACCINE   11/15/2020    There are no preventive care reminders to display for this patient.  Lab Results  Component Value Date   TSH 1.533 12/24/2018   Lab Results  Component Value Date   WBC 11.0 (H) 11/12/2020   HGB 11.3 (L) 11/12/2020   HCT 35.6 (L) 11/12/2020   MCV 89.4 11/12/2020   PLT 585 (H) 11/12/2020   Lab Results  Component Value Date   NA 138 11/12/2020   K 4.0 11/12/2020   CO2 24 11/12/2020   GLUCOSE 99 11/12/2020   BUN 15 11/12/2020   CREATININE 0.79 11/12/2020   BILITOT 0.4 11/12/2020   ALKPHOS 70 11/12/2020   AST 19 11/12/2020   ALT 18 11/12/2020   PROT 7.2 11/12/2020   ALBUMIN 3.5 11/12/2020   CALCIUM 8.7 (L) 11/12/2020   ANIONGAP 5 11/12/2020   Lab Results  Component Value Date   CHOL 155 04/20/2017   Lab Results  Component Value Date   HDL 70 04/20/2017   Lab Results  Component Value Date   LDLCALC 72 04/20/2017   Lab Results  Component Value Date   TRIG 46 04/20/2017   Lab Results  Component Value Date   CHOLHDL 2.2 04/20/2017   Lab  Results  Component Value Date   HGBA1C 5.4 04/20/2017      Assessment & Plan:   Problem List Items Addressed This Visit       Cardiovascular and Mediastinum   Primary hypertension    BP Readings from Last 1 Encounters:  11/18/20 138/88  Well-controlled Counseled for compliance with the medications Advised DASH diet and moderate exercise/walking, at least 150 mins/week      Relevant Medications   furosemide (LASIX) 20 MG tablet   Acute deep vein thrombosis (DVT) of proximal vein of lower extremity (HCC)    On Eliquis 5 mg BID Referred to Vascular surgery as per patient request       Relevant Medications   furosemide (LASIX) 20 MG tablet     Other   MDD (major depressive disorder), recurrent episode, severe ([place])    [place] Visit from 11/18/2020 in [facility]  PHQ-9 Total Score 24  Uncontrolled, has run out of Celexa - refilled for now [employer]  Psychiatry Discussed about Suicide prevention hotline and access to [place] facility in case of suicidal ideation.      Relevant Medications   citalopram (CELEXA) 20 MG tablet   hydrOXYzine (VISTARIL) 50 MG capsule   Insomnia    Was on Hydroxyzine 50 mg qHS PRN, refills provided.       Relevant Medications   hydrOXYzine (VISTARIL) 50 MG capsule   S/P lumbar fusion    H/o chronic low back pain S/p lumbar fusion surgery - L3-L4 F/u with Spine surgery Home health - skilled nursing and PT would help.       Bilateral leg edema - Primary    On Torsemide, recently ran out of it Continue PT Leg elevation as tolerated Compression stocking Started Lasix 20 mg QD       Relevant Medications   furosemide (LASIX) 20 MG tablet   Other Visit Diagnoses     External hemorrhoid       Relevant Medications   furosemide (LASIX) 20 MG tablet   hydrocortisone (ANUSOL-HC) 2.5 % rectal cream   Other Relevant Orders   Ambulatory referral to Gastroenterology   Colon cancer screening       Relevant Orders   Ambulatory referral to Gastroenterology   Peripheral polyneuropathy       Relevant Medications   citalopram (CELEXA) 20 MG tablet   hydrOXYzine (VISTARIL) 50 MG capsule   Other Relevant Orders   Ambulatory referral to Neurology   Annual physical exam       Relevant Orders   CBC with Differential/Platelet   CMP14+EGFR   Lipid Profile   TSH + free T4   Vitamin D (25 hydroxy)   HgB A1c   Need for viral immunization       Relevant Orders   Varicella-zoster vaccine IM (Shingrix) (Completed)       Meds ordered this encounter  Medications   furosemide (LASIX) 20 MG tablet    Sig: Take 1 tablet (20 mg total) by mouth daily.    Dispense:  30 tablet    Refill:  1   hydrocortisone (ANUSOL-HC) 2.5 % rectal cream    Sig: Place 1 application rectally 2 (two) times daily.    Dispense:  30 g    Refill:  0   citalopram (CELEXA) 20 MG tablet    Sig: Take 1  tablet (20 mg total) by mouth daily.    Dispense:  30 tablet    Refill:  1   hydrOXYzine (VISTARIL) 50 MG capsule    Sig: Take 1 capsule (50 mg total) by mouth daily as needed for anxiety (Insomnia).    Dispense:  30 capsule    Refill:  5    Follow-up: Return in about 4 months (around 03/20/2021) for Annual physical.    Lindell Spar, MD

## 2020-11-18 NOTE — Assessment & Plan Note (Addendum)
On Torsemide, recently ran out of it Continue PT Leg elevation as tolerated Compression stocking Started Lasix 20 mg QD

## 2020-11-18 NOTE — Patient Instructions (Signed)
Please start taking Celexa as prescribed for depression and Hydroxyzine for anxiety/insomnia.  Please take Lasix for leg edema. Continue leg elevation and use compression stocking when possible.  You are being referred to Neurology for headache, dizziness and nerve pain.  You are being referred to GI.  Please use Anusol cream for hemorrhoids.

## 2020-11-18 NOTE — Assessment & Plan Note (Addendum)
[place] Office Visit from 11/18/2020 in [facility]  PHQ-9 Total Score 24     Uncontrolled, has run out of Celexa - refilled for now [facility] Psychiatry Discussed about Suicide prevention hotline and access to [place] facility in case of suicidal ideation.

## 2020-11-18 NOTE — Assessment & Plan Note (Signed)
Was on Hydroxyzine 50 mg qHS PRN, refills provided.

## 2020-11-18 NOTE — Assessment & Plan Note (Addendum)
BP Readings from Last 1 Encounters:  11/18/20 138/88   Well-controlled Counseled for compliance with the medications Advised DASH diet and moderate exercise/walking, at least 150 mins/week

## 2020-11-18 NOTE — Assessment & Plan Note (Signed)
H/o chronic low back pain S/p lumbar fusion surgery - L3-L4 F/u with Spine surgery Home health - skilled nursing and PT would help.

## 2020-11-18 NOTE — Assessment & Plan Note (Signed)
On Eliquis 5 mg BID Referred to Vascular surgery as per patient request

## 2020-11-23 ENCOUNTER — Other Ambulatory Visit: Payer: Self-pay | Admitting: Internal Medicine

## 2020-11-23 ENCOUNTER — Other Ambulatory Visit: Payer: Self-pay | Admitting: *Deleted

## 2020-11-23 ENCOUNTER — Telehealth: Payer: Self-pay

## 2020-11-23 ENCOUNTER — Encounter (HOSPITAL_COMMUNITY): Payer: Medicaid Other | Admitting: Physical Therapy

## 2020-11-23 DIAGNOSIS — I824Y9 Acute embolism and thrombosis of unspecified deep veins of unspecified proximal lower extremity: Secondary | ICD-10-CM

## 2020-11-23 DIAGNOSIS — F419 Anxiety disorder, unspecified: Secondary | ICD-10-CM | POA: Diagnosis not present

## 2020-11-23 NOTE — Telephone Encounter (Signed)
Pt prescription sent to pharmacy

## 2020-11-23 NOTE — Telephone Encounter (Signed)
Patient called needs a prescription for compression stocking fax to [place] [phone]

## 2020-11-25 ENCOUNTER — Encounter (HOSPITAL_COMMUNITY): Payer: Medicaid Other | Admitting: Physical Therapy

## 2020-11-25 NOTE — Telephone Encounter (Signed)
Tammy from [facility] called needs to know what strength of compression hose 15-20, 20-30. Call back # [phone]

## 2020-11-25 NOTE — Telephone Encounter (Signed)
Tammy advised 15-20

## 2020-11-26 ENCOUNTER — Other Ambulatory Visit: Payer: Self-pay | Admitting: Neurology

## 2020-11-26 ENCOUNTER — Other Ambulatory Visit (HOSPITAL_COMMUNITY): Payer: Self-pay | Admitting: Neurology

## 2020-11-26 DIAGNOSIS — G4733 Obstructive sleep apnea (adult) (pediatric): Secondary | ICD-10-CM | POA: Diagnosis not present

## 2020-11-26 DIAGNOSIS — E669 Obesity, unspecified: Secondary | ICD-10-CM | POA: Diagnosis not present

## 2020-11-26 DIAGNOSIS — M961 Postlaminectomy syndrome, not elsewhere classified: Secondary | ICD-10-CM

## 2020-11-26 DIAGNOSIS — G629 Polyneuropathy, unspecified: Secondary | ICD-10-CM | POA: Diagnosis not present

## 2020-11-26 DIAGNOSIS — I10 Essential (primary) hypertension: Secondary | ICD-10-CM | POA: Diagnosis not present

## 2020-11-26 DIAGNOSIS — G609 Hereditary and idiopathic neuropathy, unspecified: Secondary | ICD-10-CM | POA: Diagnosis not present

## 2020-12-02 ENCOUNTER — Other Ambulatory Visit: Payer: Self-pay | Admitting: *Deleted

## 2020-12-02 NOTE — Patient Outreach (Signed)
Care Coordination  12/02/2020  Kathryn [name] October 19, 1970 [id number]   Medicaid Managed Care   Unsuccessful Outreach Note  12/02/2020 Name: Kathryn [name] MRN: [id number] DOB: 01-08-1971  Referred by: Lindell Spar, MD Reason for referral : High Risk Managed Medicaid (Unsuccessful RNCM initial outreach 2nd attempt)   A second unsuccessful telephone outreach was attempted today. The patient was referred to the case management team for assistance with care management and care coordination.   Follow Up Plan: The care management team will reach out to the patient again over the next 21 days.   Lurena Joiner RN, BSN [place]  [employer] manager

## 2020-12-02 NOTE — Patient Instructions (Signed)
Visit Information  Kathryn [name]  - as a part of your Medicaid benefit, you are eligible for care management and care coordination services at no cost or copay. I was unable to reach you by phone today but would be happy to help you with your health related needs. Please feel free to call me @ [phone].   A member of the Managed Medicaid care management team will reach out to you again over the next 21 days.   Lurena Joiner RN, BSN [place]  [employer] manager

## 2020-12-10 ENCOUNTER — Other Ambulatory Visit: Payer: Self-pay | Admitting: Internal Medicine

## 2020-12-10 ENCOUNTER — Other Ambulatory Visit (HOSPITAL_BASED_OUTPATIENT_CLINIC_OR_DEPARTMENT_OTHER): Payer: Self-pay

## 2020-12-10 DIAGNOSIS — R600 Localized edema: Secondary | ICD-10-CM

## 2020-12-10 DIAGNOSIS — G4733 Obstructive sleep apnea (adult) (pediatric): Secondary | ICD-10-CM

## 2020-12-16 ENCOUNTER — Emergency Department (HOSPITAL_COMMUNITY)
Admission: EM | Admit: 2020-12-16 | Discharge: 2020-12-16 | Disposition: A | Discharge status: Home / Self Care | Payer: Medicaid Other | Attending: Emergency Medicine | Admitting: Emergency Medicine

## 2020-12-16 ENCOUNTER — Emergency Department (HOSPITAL_COMMUNITY): Payer: Medicaid Other

## 2020-12-16 ENCOUNTER — Other Ambulatory Visit: Payer: Self-pay

## 2020-12-16 ENCOUNTER — Encounter (HOSPITAL_COMMUNITY): Payer: Self-pay

## 2020-12-16 DIAGNOSIS — J449 Chronic obstructive pulmonary disease, unspecified: Secondary | ICD-10-CM | POA: Diagnosis not present

## 2020-12-16 DIAGNOSIS — Z7901 Long term (current) use of anticoagulants: Secondary | ICD-10-CM | POA: Insufficient documentation

## 2020-12-16 DIAGNOSIS — M545 Low back pain, unspecified: Secondary | ICD-10-CM | POA: Diagnosis not present

## 2020-12-16 DIAGNOSIS — M5136 Other intervertebral disc degeneration, lumbar region: Secondary | ICD-10-CM | POA: Insufficient documentation

## 2020-12-16 DIAGNOSIS — Z87891 Personal history of nicotine dependence: Secondary | ICD-10-CM | POA: Diagnosis not present

## 2020-12-16 DIAGNOSIS — R55 Syncope and collapse: Secondary | ICD-10-CM | POA: Diagnosis not present

## 2020-12-16 DIAGNOSIS — W01198A Fall on same level from slipping, tripping and stumbling with subsequent striking against other object, initial encounter: Secondary | ICD-10-CM | POA: Diagnosis not present

## 2020-12-16 DIAGNOSIS — I10 Essential (primary) hypertension: Secondary | ICD-10-CM | POA: Diagnosis not present

## 2020-12-16 DIAGNOSIS — Z7951 Long term (current) use of inhaled steroids: Secondary | ICD-10-CM | POA: Insufficient documentation

## 2020-12-16 DIAGNOSIS — S069X9A Unspecified intracranial injury with loss of consciousness of unspecified duration, initial encounter: Secondary | ICD-10-CM | POA: Diagnosis not present

## 2020-12-16 DIAGNOSIS — M5416 Radiculopathy, lumbar region: Secondary | ICD-10-CM | POA: Diagnosis not present

## 2020-12-16 DIAGNOSIS — Z79899 Other long term (current) drug therapy: Secondary | ICD-10-CM | POA: Diagnosis not present

## 2020-12-16 DIAGNOSIS — J4540 Moderate persistent asthma, uncomplicated: Secondary | ICD-10-CM | POA: Diagnosis not present

## 2020-12-16 DIAGNOSIS — M5410 Radiculopathy, site unspecified: Secondary | ICD-10-CM | POA: Insufficient documentation

## 2020-12-16 DIAGNOSIS — M479 Spondylosis, unspecified: Secondary | ICD-10-CM

## 2020-12-16 LAB — CBC WITH DIFFERENTIAL/PLATELET
Abs Immature Granulocytes: 0.04 10*3/uL (ref 0.00–0.07)
Basophils Absolute: 0 10*3/uL (ref 0.0–0.1)
Basophils Relative: 1 %
Eosinophils Absolute: 0.1 10*3/uL (ref 0.0–0.5)
Eosinophils Relative: 2 %
HCT: 36.5 % (ref 36.0–46.0)
Hemoglobin: 11.7 g/dL — ABNORMAL LOW (ref 12.0–15.0)
Immature Granulocytes: 1 %
Lymphocytes Relative: 30 %
Lymphs Abs: 2.2 10*3/uL (ref 0.7–4.0)
MCH: 28 pg (ref 26.0–34.0)
MCHC: 32.1 g/dL (ref 30.0–36.0)
MCV: 87.3 fL (ref 80.0–100.0)
Monocytes Absolute: 0.5 10*3/uL (ref 0.1–1.0)
Monocytes Relative: 7 %
Neutro Abs: 4.4 10*3/uL (ref 1.7–7.7)
Neutrophils Relative %: 59 %
Platelets: 592 10*3/uL — ABNORMAL HIGH (ref 150–400)
RBC: 4.18 MIL/uL (ref 3.87–5.11)
RDW: 16 % — ABNORMAL HIGH (ref 11.5–15.5)
WBC: 7.4 10*3/uL (ref 4.0–10.5)
nRBC: 0 % (ref 0.0–0.2)

## 2020-12-16 LAB — BASIC METABOLIC PANEL
Anion gap: 4 — ABNORMAL LOW (ref 5–15)
BUN: 13 mg/dL (ref 6–20)
CO2: 25 mmol/L (ref 22–32)
Calcium: 8.7 mg/dL — ABNORMAL LOW (ref 8.9–10.3)
Chloride: 108 mmol/L (ref 98–111)
Creatinine, Ser: 0.89 mg/dL (ref 0.44–1.00)
GFR, Estimated: >60 mL/min (ref >60)
Glucose, Bld: 99 mg/dL (ref 70–99)
Potassium: 3.7 mmol/L (ref 3.5–5.1)
Sodium: 137 mmol/L (ref 135–145)

## 2020-12-16 MED ORDER — OXYCODONE HCL 5 MG PO TABS
5.0000 mg | ORAL_TABLET | Freq: Once | ORAL | Status: AC
  Administered 2020-12-16: 5 mg via ORAL
  Filled 2020-12-16: qty 1

## 2020-12-16 MED ORDER — METHYLPREDNISOLONE 4 MG PO TBPK: ORAL_TABLET | ORAL | 0 refills | Status: DC

## 2020-12-16 MED ORDER — FENTANYL CITRATE PF 50 MCG/ML IJ SOSY
50.0000 ug | PREFILLED_SYRINGE | Freq: Once | INTRAMUSCULAR | Status: DC
Start: 2020-12-16 — End: 2020-12-16
  Filled 2020-12-16: qty 1

## 2020-12-16 MED ORDER — PREDNISONE 50 MG PO TABS
60.0000 mg | ORAL_TABLET | Freq: Once | ORAL | Status: AC
  Administered 2020-12-16: 60 mg via ORAL
  Filled 2020-12-16: qty 1

## 2020-12-16 MED ORDER — FENTANYL CITRATE PF 50 MCG/ML IJ SOSY
50.0000 ug | PREFILLED_SYRINGE | Freq: Once | INTRAMUSCULAR | Status: AC
Start: 2020-12-16 — End: 2020-12-16
  Administered 2020-12-16: 50 ug via INTRAMUSCULAR

## 2020-12-16 MED ORDER — OXYCODONE-ACETAMINOPHEN 5-325 MG PO TABS
1.0000 | ORAL_TABLET | Freq: Once | ORAL | Status: AC
Start: 2020-12-16 — End: 2020-12-16
  Administered 2020-12-16: 1 via ORAL
  Filled 2020-12-16: qty 1

## 2020-12-16 MED ORDER — OXYCODONE-ACETAMINOPHEN 5-325 MG PO TABS: 1.0000 | ORAL_TABLET | Freq: Four times a day (QID) | ORAL | 0 refills | Status: DC | PRN

## 2020-12-16 NOTE — ED Notes (Addendum)
Patient ambulated from room to restroom.  Patient was able to walk with her walker.  Patient states she uses a walker or cane daily.   Patient advised we needed urine specimen.  Stated she was unable to provide at this time.

## 2020-12-16 NOTE — ED Provider Notes (Signed)
[facility] EMERGENCY DEPARTMENT Provider Note   CSN: [id number] Arrival date & time: 12/16/20  0735     History Chief Complaint  Patient presents with   [name]    Kathryn [name] is a 50 y.o. female.  HPI    50 year old female with history of COPD, DVT/PE on Eliquis, chronic degenerative spine disease status post lumbar spine surgery in May of this year comes in with chief complaint of fall.  Patient states that over the weekend she had 2 mechanical falls.  On 1 occasion she fell backwards, on the other when she fell forwards.  Since the fall her pain has gotten worse.  She is having pain or her back that is radiating down both legs.  She also has had difficulty ambulating because of pain.  She has taken Tylenol without significant relief.  She is also complaining of severe headache that has been fairly constant.  No associated focal one-sided weakness, numbness, slurred speech, vision change.  Finally, patient indicates that she is having some numbness around her perineum and also urinary incontinence, however the symptoms have been present since May, after the surgery.  She is supposed to follow-up with neurosurgery next week.  In our chart, it appears that an MRI was ordered in August.  She is unsure why the MRI was ordered, but reports that she never was able to follow-up.  Past Medical History:  Diagnosis Date   Anxiety    Arthritis    Asthma    Bipolar disorder ([place])    Chronic back pain    COPD (chronic obstructive pulmonary disease) (HCC)    Chronic bronchitis   Depression    GERD (gastroesophageal reflux disease)    Hypertension    Neuropathy    Osteoarthritis of left knee, 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   Sleep apnea 04/2020   GETTING A cpap   Suicidal ideation 12/24/2018    Patient Active Problem List   Diagnosis Date Noted   Bilateral leg edema 10/18/2020   Morbid obesity ([place]) 10/18/2020   S/P lumbar fusion 09/14/2020   Acute deep vein thrombosis (DVT) of proximal  vein of lower extremity ([place]) 09/14/2020   Spondylolisthesis at L4-L5 level 03/29/2020   Insomnia 03/19/2020   Mild persistent asthma without complication [id number]   Need for immunization against influenza 03/19/2020   Body mass index (BMI) 38.0-38.9, adult 03/19/2020   GERD (gastroesophageal reflux disease) 03/19/2020   Intermittent palpitations 12/17/2019   Snoring 12/17/2019   Excessive daytime sleepiness 12/17/2019   Nightmare disorder, 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   Enlarged uterus 02/20/2018   Screening for colorectal cancer 02/20/2018   Osteoarthritis of left knee, 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   MDD (major depressive disorder), recurrent episode, severe ([place]) 12/24/2017   Low back pain, unspecified 05/11/2017   Low vitamin B12 level 05/11/2017   Primary hypertension 01/11/2017    Past Surgical History:  Procedure Laterality Date   BACK SURGERY     DILATATION AND CURETTAGE/HYSTEROSCOPY WITH MINERVA N/A 06/09/2020   Procedure: DILATATION AND CURETTAGE/HYSTEROSCOPY WITH MINERVA;  Surgeon: Florian Buff, MD;  Location: [facility] ORS;  Service: Gynecology;  Laterality: N/A;   ESOPHAGOGASTRODUODENOSCOPY (EGD) WITH PROPOFOL N/A 12/25/2018   Procedure: ESOPHAGOGASTRODUODENOSCOPY (EGD) WITH PROPOFOL;  Surgeon: Rogene Houston, MD;  Location: [facility] ENDO SUITE;  Service: Endoscopy;  Laterality: N/A;   PATELLA-FEMORAL ARTHROPLASTY Left 10/08/2018   Procedure: PATELLA-FEMORAL ARTHROPLASTY;  Surgeon: Marchia Bond, MD;  Location: [facility];  Service: Orthopedics;  Laterality: Left;   TUBAL LIGATION  OB History     Gravida  5   Para  3   Term  2   Preterm  1   AB  2   Living  3      SAB  1   IAB  1   Ectopic      Multiple      Live Births              Family History  Problem Relation Age of Onset   Gout Paternal Grandfather    Cirrhosis Paternal Grandfather    Hypertension Paternal Grandmother    Aneurysm Paternal Grandmother    Cirrhosis Maternal Grandmother     Cirrhosis Maternal Grandfather    Cancer Father    Cirrhosis Father    Cirrhosis Mother    Breast cancer Sister    Hypertension Sister    Bronchitis Daughter    Bronchitis Daughter    Asthma Son    Bronchitis Son    Migraines Neg Hx     Social History   Tobacco Use   Smoking status: Former    Types: Cigarettes    Quit date: 2016    Years since quitting: 6.6   Smokeless tobacco: Never   Tobacco comments:    wears nicotine patches  Vaping Use   Vaping Use: Never used  Substance Use Topics   Alcohol use: Not Currently   Drug use: Not Currently    Types: Cocaine    Comment: crack  last used 2016    Home Medications Prior to Admission medications   Medication Sig Start Date End Date Taking? Authorizing Provider  methylPREDNISolone (MEDROL DOSEPAK) 4 MG TBPK tablet Day 1: 8 mg PO before breakfast, 4 mg after lunch and after dinner, and 8 mg at bedtime  Day 2: 4 mg PO before breakfast, after lunch, and after dinner and 8 mg at bedtime  Day 3: 4 mg PO before breakfast, after lunch, after dinner, and at bedtime  Day 4: 4 mg PO before breakfast, after lunch, and at bedtime  Day 5: 4 mg PO before breakfast and at bedtime  Day 6: 4 mg PO before breakfast 12/17/20  Yes Konstantine Gervasi, MD  oxyCODONE-acetaminophen (PERCOCET/ROXICET) 5-325 MG tablet Take 1 tablet by mouth every 6 (six) hours as needed for severe pain. 12/16/20  Yes Varney Biles, MD  albuterol (PROVENTIL HFA;VENTOLIN HFA) 108 (90 Base) MCG/ACT inhaler Inhale 2 puffs into the lungs every 6 (six) hours as needed for wheezing or shortness of breath. Patient not taking: Reported on 11/18/2020    [provider]  albuterol (PROVENTIL) (2.5 MG/3ML) 0.083% nebulizer solution Take 2.5 mg by nebulization every 6 (six) hours as needed for wheezing or shortness of breath. Patient not taking: Reported on 11/18/2020    [provider]  amLODipine (NORVASC) 5 MG tablet TAKE 1 TABLET BY MOUTH ONCE A DAY FOR BLOOD  PRESSURE. 10/26/20   Lindell Spar, MD  apixaban (ELIQUIS) 5 MG TABS tablet Take 1 tablet (5 mg total) by mouth 2 (two) times daily. 09/07/20   Costella, Vista Mink, PA-C  budesonide-formoterol (SYMBICORT) 80-4.5 MCG/ACT inhaler Inhale 2 puffs into the lungs 2 (two) times daily. Patient not taking: No sig reported 03/19/20   Lindell Spar, MD  celecoxib (CELEBREX) 200 MG capsule Take 1 capsule (200 mg total) by mouth daily. 11/23/20   Lindell Spar, MD  citalopram (CELEXA) 20 MG tablet Take 1 tablet (20 mg total) by mouth daily. 11/18/20  Lindell Spar, MD  cloNIDine (CATAPRES) 0.1 MG tablet TAKE 1 TABLET BY MOUTH ONCE DAILY FOR BLOOD PRESSURE AND ANXIETY. Patient taking differently: Take 0.1 mg by mouth daily. 06/02/20   Lindell Spar, MD  Cyanocobalamin (VITAMIN B 12 PO) Take by mouth.    [provider]  furosemide (LASIX) 20 MG tablet TAKE ONE TABLET BY MOUTH ONCE DAILY. 12/13/20   Lindell Spar, MD  hydrocortisone (ANUSOL-HC) 2.5 % rectal cream Place 1 application rectally 2 (two) times daily. 11/18/20   Lindell Spar, MD  hydrOXYzine (VISTARIL) 50 MG capsule Take 1 capsule (50 mg total) by mouth daily as needed for anxiety (Insomnia). 11/18/20   Lindell Spar, MD  omeprazole (PRILOSEC) 20 MG capsule TAKE 1 CAPSULE BY MOUTH ONCE A DAY. 12/13/20   Lindell Spar, MD  potassium chloride SA (KLOR-CON) 20 MEQ tablet Take 1 tablet (20 mEq total) by mouth daily. 10/12/20   Fayrene Helper, MD  pregabalin (LYRICA) 75 MG capsule Take 1 capsule (75 mg total) by mouth 2 (two) times daily. 08/16/20   Lindell Spar, MD  Relugolix-Estradiol-Norethind (MYFEMBREE) 40-1-0.5 MG TABS Take 1 tablet by mouth daily. Patient not taking: Reported on 11/18/2020 08/06/20   Florian Buff, MD  tiZANidine (ZANAFLEX) 2 MG tablet TAKE 1 TABLET BY MOUTH TWICE DAILY AS NEEDED FOR MUSCLE SPASMS 12/10/20   Lindell Spar, MD    Allergies    Clonopin [clonazepam], Fish allergy, Flexeril [cyclobenzaprine hcl],  Ibuprofen, Shellfish allergy, Tylenol [acetaminophen], Ace inhibitors, Cyclobenzaprine, Tramadol, and Trazodone and nefazodone  Review of Systems   Review of Systems  Constitutional:  Positive for activity change.  Respiratory:  Negative for shortness of breath.   Cardiovascular:  Negative for chest pain.  Genitourinary:  Positive for difficulty urinating.  Musculoskeletal:  Positive for back pain.  All other systems reviewed and are negative.  Physical Exam Updated Vital Signs BP 121/80   Pulse 78   Temp 98.1 F (36.7 C) (Oral)   Resp 18   Ht '4\' 11"'$  (1.499 m)   Wt 85.7 kg   LMP 06/07/2020   SpO2 100%   BMI 38.17 kg/m   Physical Exam Vitals and nursing note reviewed.  Constitutional:      Appearance: She is well-developed.  HENT:     Head: Atraumatic.  Cardiovascular:     Rate and Rhythm: Normal rate.  Pulmonary:     Effort: Pulmonary effort is normal.  Musculoskeletal:     Cervical back: Normal range of motion and neck supple.  Skin:    General: Skin is warm and dry.  Neurological:     Mental Status: She is alert and oriented to person, place, and time.     Comments: Lower extremity strength is 4+ out of 5 bilaterally and equal. Gross sensory exam is also equal in both legs. Reflex grade is 1 for bilateral patella     ED Results / Procedures / Treatments   Labs (all labs ordered are listed, but only abnormal results are displayed) Labs Reviewed  BASIC METABOLIC PANEL - Abnormal; Notable for the following components:      Result Value   Calcium 8.7 (*)    Anion gap 4 (*)    All other components within normal limits  CBC WITH DIFFERENTIAL/PLATELET - Abnormal; Notable for the following components:   Hemoglobin 11.7 (*)    RDW 16.0 (*)    Platelets 592 (*)    All other components within normal  limits  URINALYSIS, ROUTINE W REFLEX MICROSCOPIC    EKG None  Radiology DG Lumbar Spine Complete  Result Date: 12/16/2020 CLINICAL DATA:  Fall, back pain EXAM:  LUMBAR SPINE - COMPLETE 4+ VIEW COMPARISON:  10/27/2020 FINDINGS: Prominent stool throughout the colon favors constipation. Posterolateral rod and pedicle screw fixation at L3-L4-L5 posterior decompression, facetectomies, and interbody spacers. No subluxation or fracture identified. No acute bony findings. IMPRESSION: 1. No lumbar spine fracture or acute subluxation. 2. Postoperative findings at L3-L4-L5 as noted above. 3.  Prominent stool throughout the colon favors constipation. Electronically Signed   By: Van Clines M.D.   On: 12/16/2020 09:45   CT HEAD WO CONTRAST (5MM)  Result Date: 12/16/2020 CLINICAL DATA:  Fall striking the back of the head. Anti coagulation. Loss of consciousness. EXAM: CT HEAD WITHOUT CONTRAST TECHNIQUE: Contiguous axial images were obtained from the base of the skull through the vertex without intravenous contrast. COMPARISON:  10/27/2020 FINDINGS: Brain: The brainstem, cerebellum, cerebral peduncles, thalami, basal ganglia, basilar cisterns, and ventricular system appear within normal limits. 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 No intracranial hemorrhage, mass lesion, or acute CVA. Vascular: Unremarkable Skull: Unremarkable Sinuses/Orbits: Unremarkable Other: No supplemental non-categorized findings. IMPRESSION: 1. No acute intracranial findings. 2. 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 Electronically Signed   By: Van Clines M.D.   On: 12/16/2020 09:43    Procedures Procedures   Medications Ordered in ED Medications  fentaNYL (SUBLIMAZE) injection 50 mcg (has no administration in time range)  predniSONE (DELTASONE)  tablet 60 mg (has no administration in time range)  oxyCODONE-acetaminophen (PERCOCET/ROXICET) 5-325 MG per tablet 1 tablet (has no administration in time range)  oxyCODONE (Oxy IR/ROXICODONE) immediate release tablet 5 mg (5 mg Oral Given 12/16/20 [id number])    ED Course  I have reviewed the triage vital signs and the nursing notes.  Pertinent labs & imaging results that were available during my care of the patient were reviewed by me and considered in my medical decision making (see chart for details).    MDM Rules/Calculators/A&P                           50 year old woman comes in a chief complaint of back pain.  She has history of PE-DVT and is on anticoagulation.  Additionally she has chronic back issues with lumbar surgery in May 2022.  Confounding the visit is that she has had some saddle anesthesia and urinary retention since the surgery.  Her primary concern today is back pain that is radiating down her legs.  She has no other new neurologic symptoms associated with that.  Pain is secondary to fall.  She is also complaining of headaches.  Given that she is on Eliquis and having headaches, CT scan of the brain has been ordered.  Her neuro history and exam is indicative of probably some central cord issue.  However given that her symptoms have been present since May, there is an outpatient MRI lumbar spine with and without even ordered for her -and there is been no actual progression of the symptoms after the fall, I doubt that there is a neurosurgical emergency at this time.  She has an outpatient follow-up with  neurosurgery next week.  There is already an outpatient MRI ordered.  I have requested the patient to call the neurology team that has ordered the MRI to ensure that she gets it on time, prior to the neurosurgery appointment.  Our secretary tried to schedule the outpatient MRI, however the MRI team indicated that patient's neurology team has to call and certify the MRI, and schedule  it.  Patient made aware of this request.  Strict ER return precautions have been discussed, and patient is agreeing with the plan and is comfortable with the workup done and the recommendations from the ER.   Final Clinical Impression(s) / ED Diagnoses Final diagnoses:  Radicular pain  Degenerative joint disease of low back    Rx / DC Orders ED Discharge Orders          Ordered    methylPREDNISolone (MEDROL DOSEPAK) 4 MG TBPK tablet        12/16/20 1151    oxyCODONE-acetaminophen (PERCOCET/ROXICET) 5-325 MG tablet  Every 6 hours PRN        12/16/20 1201             Varney Biles, MD 12/16/20 1207
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
## 2020-12-16 NOTE — Discharge Instructions (Addendum)
You are seen in the ER for back pain.  We have discussed your pending MRI with the outpatient MRI team at [facility].  They have informed us that you we will have to call Dr. Freddie Apley clinic to get the MRI scheduled.  Please call the number provided to get the MRI completed, ideally before your neurosurgery appointment.  Please make sure you are seen by the neurosurgeon on your appointment date.  Return to the ER if you start having weakness in your legs, you wet yourself without knowing you have to go, you stop urinating.

## 2020-12-17 ENCOUNTER — Telehealth: Payer: Self-pay | Admitting: *Deleted

## 2020-12-17 NOTE — Telephone Encounter (Signed)
Transition Care Management Unsuccessful Follow-up Telephone Call  Date of discharge and from where:  12/16/2020 - [facility]  Attempts:  1st Attempt  Reason for unsuccessful TCM follow-up call:  Unable to reach patient

## 2020-12-21 NOTE — Telephone Encounter (Signed)
Transition Care Management Unsuccessful Follow-up Telephone Call  Date of discharge and from where:  12/16/2020-   Attempts:  2nd Attempt  Reason for unsuccessful TCM follow-up call:  Unable to reach patient

## 2020-12-22 ENCOUNTER — Emergency Department (HOSPITAL_COMMUNITY)
Admission: EM | Admit: 2020-12-22 | Discharge: 2020-12-22 | Disposition: A | Discharge status: Home / Self Care | Payer: Medicaid Other | Attending: Emergency Medicine | Admitting: Emergency Medicine

## 2020-12-22 ENCOUNTER — Other Ambulatory Visit: Payer: Self-pay

## 2020-12-22 ENCOUNTER — Encounter (HOSPITAL_COMMUNITY): Payer: Self-pay

## 2020-12-22 DIAGNOSIS — Z87891 Personal history of nicotine dependence: Secondary | ICD-10-CM | POA: Diagnosis not present

## 2020-12-22 DIAGNOSIS — J45909 Unspecified asthma, uncomplicated: Secondary | ICD-10-CM | POA: Insufficient documentation

## 2020-12-22 DIAGNOSIS — B9689 Other specified bacterial agents as the cause of diseases classified elsewhere: Secondary | ICD-10-CM | POA: Insufficient documentation

## 2020-12-22 DIAGNOSIS — Z7901 Long term (current) use of anticoagulants: Secondary | ICD-10-CM | POA: Diagnosis not present

## 2020-12-22 DIAGNOSIS — Z79899 Other long term (current) drug therapy: Secondary | ICD-10-CM | POA: Diagnosis not present

## 2020-12-22 DIAGNOSIS — Z86718 Personal history of other venous thrombosis and embolism: Secondary | ICD-10-CM | POA: Insufficient documentation

## 2020-12-22 DIAGNOSIS — J449 Chronic obstructive pulmonary disease, unspecified: Secondary | ICD-10-CM | POA: Insufficient documentation

## 2020-12-22 DIAGNOSIS — M5442 Lumbago with sciatica, left side: Secondary | ICD-10-CM | POA: Diagnosis not present

## 2020-12-22 DIAGNOSIS — Z7951 Long term (current) use of inhaled steroids: Secondary | ICD-10-CM | POA: Insufficient documentation

## 2020-12-22 DIAGNOSIS — M5441 Lumbago with sciatica, right side: Secondary | ICD-10-CM | POA: Insufficient documentation

## 2020-12-22 DIAGNOSIS — W500XXA Accidental hit or strike by another person, initial encounter: Secondary | ICD-10-CM | POA: Insufficient documentation

## 2020-12-22 DIAGNOSIS — N390 Urinary tract infection, site not specified: Secondary | ICD-10-CM | POA: Insufficient documentation

## 2020-12-22 DIAGNOSIS — I10 Essential (primary) hypertension: Secondary | ICD-10-CM | POA: Insufficient documentation

## 2020-12-22 DIAGNOSIS — K219 Gastro-esophageal reflux disease without esophagitis: Secondary | ICD-10-CM | POA: Diagnosis not present

## 2020-12-22 DIAGNOSIS — G8929 Other chronic pain: Secondary | ICD-10-CM | POA: Diagnosis not present

## 2020-12-22 DIAGNOSIS — Z96652 Presence of left artificial knee joint: Secondary | ICD-10-CM | POA: Diagnosis not present

## 2020-12-22 DIAGNOSIS — R109 Unspecified abdominal pain: Secondary | ICD-10-CM | POA: Diagnosis present

## 2020-12-22 LAB — CBC
HCT: 36.7 % (ref 36.0–46.0)
Hemoglobin: 11.6 g/dL — ABNORMAL LOW (ref 12.0–15.0)
MCH: 28 pg (ref 26.0–34.0)
MCHC: 31.6 g/dL (ref 30.0–36.0)
MCV: 88.6 fL (ref 80.0–100.0)
Platelets: 632 10*3/uL — ABNORMAL HIGH (ref 150–400)
RBC: 4.14 MIL/uL (ref 3.87–5.11)
RDW: 16.4 % — ABNORMAL HIGH (ref 11.5–15.5)
WBC: 7.3 10*3/uL (ref 4.0–10.5)
nRBC: 0 % (ref 0.0–0.2)

## 2020-12-22 LAB — COMPREHENSIVE METABOLIC PANEL
ALT: 31 U/L (ref 0–44)
AST: 31 U/L (ref 15–41)
Albumin: 3.7 g/dL (ref 3.5–5.0)
Alkaline Phosphatase: 85 U/L (ref 38–126)
Anion gap: 7 (ref 5–15)
BUN: 15 mg/dL (ref 6–20)
CO2: 26 mmol/L (ref 22–32)
Calcium: 9.1 mg/dL (ref 8.9–10.3)
Chloride: 109 mmol/L (ref 98–111)
Creatinine, Ser: 0.71 mg/dL (ref 0.44–1.00)
GFR, Estimated: >60 mL/min (ref >60)
Glucose, Bld: 105 mg/dL — ABNORMAL HIGH (ref 70–99)
Potassium: 3.7 mmol/L (ref 3.5–5.1)
Sodium: 142 mmol/L (ref 135–145)
Total Bilirubin: <0.1 mg/dL — ABNORMAL LOW (ref 0.3–1.2)
Total Protein: 7.3 g/dL (ref 6.5–8.1)

## 2020-12-22 LAB — PREGNANCY, URINE: Preg Test, Ur: NEGATIVE

## 2020-12-22 LAB — URINALYSIS, ROUTINE W REFLEX MICROSCOPIC
Bilirubin Urine: NEGATIVE
Glucose, UA: NEGATIVE mg/dL
Hgb urine dipstick: NEGATIVE
Ketones, ur: NEGATIVE mg/dL
Nitrite: NEGATIVE
Protein, ur: NEGATIVE mg/dL
Specific Gravity, Urine: 1.02 (ref 1.005–1.030)
pH: 6.5 (ref 5.0–8.0)

## 2020-12-22 LAB — URINALYSIS, MICROSCOPIC (REFLEX): RBC / HPF: NONE SEEN RBC/hpf (ref 0–5)

## 2020-12-22 LAB — LIPASE, BLOOD: Lipase: 38 U/L (ref 11–51)

## 2020-12-22 MED ORDER — HYDROMORPHONE HCL 1 MG/ML IJ SOLN
1.0000 mg | Freq: Once | INTRAMUSCULAR | Status: AC
  Administered 2020-12-22: 1 mg via INTRAVENOUS
  Filled 2020-12-22: qty 1

## 2020-12-22 MED ORDER — CEPHALEXIN 500 MG PO CAPS: 500.0000 mg | ORAL_CAPSULE | Freq: Two times a day (BID) | ORAL | 0 refills | Status: AC

## 2020-12-22 MED ORDER — DICYCLOMINE HCL 10 MG/ML IM SOLN
20.0000 mg | Freq: Once | INTRAMUSCULAR | Status: AC
  Administered 2020-12-22: 20 mg via INTRAMUSCULAR
  Filled 2020-12-22: qty 2

## 2020-12-22 MED ORDER — SODIUM CHLORIDE 0.9 % IV BOLUS
500.0000 mL | Freq: Once | INTRAVENOUS | Status: AC
  Administered 2020-12-22: 500 mL via INTRAVENOUS

## 2020-12-22 MED ORDER — ONDANSETRON HCL 4 MG/2ML IJ SOLN
4.0000 mg | Freq: Once | INTRAMUSCULAR | Status: AC
  Administered 2020-12-22: 4 mg via INTRAVENOUS
  Filled 2020-12-22: qty 2

## 2020-12-22 MED ORDER — PREDNISONE 10 MG (21) PO TBPK: ORAL_TABLET | Freq: Every day | ORAL | 0 refills | Status: DC

## 2020-12-22 NOTE — Telephone Encounter (Signed)
Transition Care Management Unsuccessful Follow-up Telephone Call  Date of discharge and from where:  12/16/2020-[facility]   Attempts:  3rd Attempt  Reason for unsuccessful TCM follow-up call:  Unable to reach patient

## 2020-12-22 NOTE — Discharge Instructions (Addendum)
You have been seen here for back pain I recommend taking over-the-counter pain medications like ibuprofen and/or Tylenol every 6 hours as needed.  Please follow dosage and on the back of bottle.  I also recommend applying heat to the area and stretching out the muscles as this will help decrease stiffness and pain.  I have given you information on exercises please follow.  Starting on a short course of steroids please take as prescribed.  He must follow-up with your neurosurgeon for further evaluation.  UTI start you on antibiotics please continue stay hydrated, follow-up with your primary care doctor in 1 week time for recheck of your urine.  Come back to the emergency department if you develop chest pain, shortness of breath, severe abdominal pain, uncontrolled nausea, vomiting, diarrhea.

## 2020-12-22 NOTE — ED Provider Notes (Signed)
[facility] Provider Note   CSN: [id number] Arrival date & time: 12/22/20  [id number]     History Chief Complaint  Patient presents with   Abdominal Pain    Kathryn [name] is a 50 y.o. female.  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  Patient states back pain started on Monday, states started on 8 PM that night, states the pain will radiate down to the back for her legs and into her calf, movement tends to make the pain worse, resting tends to improve her pain.  She states that on Sunday she had a mechanical fall, someone bumped into her and she fell onto the floor, she denies hitting her head, losing conscious, states that she was able to get up after the fall.  States she is is able to ambulate but it hurts when she does so.  She endorses that she is having difficulty with urination as well as with bowel movements, states that her last bowel movement was on Sunday, states it was very hard, never had this problem in the past.  She also notes that she is having difficult with urination, she states the last time she urinated was yesterday, states that she is never had this issue, prior to the inability to urinate she denies frequency, dysuria, hematuria denies flank pain, no vaginal discharge, vaginal bleeding, has no history of kidney stones, denies incontinency, inability to control her bowels.  Stomach pain started today, pain is all over her abdomen, she has associated nausea and vomiting, without hematemesis, she has no significant abdominal history.  She does not endorse chest pain, shortness of breath, pedal edema, paresthesia or weakness upper and/or lower extremities.  Past Medical History:  Diagnosis Date   Anxiety    Arthritis    Asthma    Bipolar disorder ([place])    Chronic back  pain    COPD (chronic obstructive pulmonary disease) (HCC)    Chronic bronchitis   Depression    GERD (gastroesophageal reflux disease)    Hypertension    Neuropathy    Osteoarthritis of left knee, 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   Sleep apnea 04/2020   GETTING A cpap   Suicidal ideation 12/24/2018    Patient Active Problem List   Diagnosis Date Noted   Bilateral leg edema 10/18/2020   Morbid obesity ([place]) 10/18/2020   S/P lumbar fusion 09/14/2020   Acute deep vein thrombosis (DVT) of proximal vein of lower extremity ([place]) 09/14/2020   Spondylolisthesis at L4-L5 level 03/29/2020   Insomnia 03/19/2020   Mild persistent asthma without complication [id number]   Need for immunization against influenza 03/19/2020   Body mass index (BMI) 38.0-38.9, adult 03/19/2020   GERD (gastroesophageal reflux disease) 03/19/2020   Intermittent palpitations 12/17/2019   Snoring 12/17/2019   Excessive daytime sleepiness 12/17/2019   Nightmare disorder, 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   Enlarged uterus 02/20/2018   Screening for colorectal cancer 02/20/2018   Osteoarthritis of left knee, 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   MDD (major depressive disorder), recurrent episode, severe ([place]) 12/24/2017   Low back pain, unspecified 05/11/2017   Low vitamin B12 level 05/11/2017   Primary hypertension 01/11/2017    Past Surgical History:  Procedure Laterality Date   BACK SURGERY     DILATATION AND CURETTAGE/HYSTEROSCOPY WITH MINERVA N/A 06/09/2020   Procedure: DILATATION AND CURETTAGE/HYSTEROSCOPY  WITH MINERVA;  Surgeon: Florian Buff, MD;  Location: [facility];  Service: Gynecology;  Laterality: N/A;   ESOPHAGOGASTRODUODENOSCOPY (EGD) WITH PROPOFOL N/A 12/25/2018   Procedure: ESOPHAGOGASTRODUODENOSCOPY (EGD) WITH PROPOFOL;  Surgeon: Rogene Houston, MD;  Location: [facility];  Service: Endoscopy;  Laterality: N/A;   PATELLA-FEMORAL ARTHROPLASTY Left 10/08/2018   Procedure: PATELLA-FEMORAL ARTHROPLASTY;  Surgeon:  Marchia Bond, MD;  Location: [facility];  Service: Orthopedics;  Laterality: Left;   TUBAL LIGATION       OB History     Gravida  5   Para  3   Term  2   Preterm  1   AB  2   Living  3      SAB  1   IAB  1   Ectopic      Multiple      Live Births              Family History  Problem Relation Age of Onset   Gout Paternal Grandfather    Cirrhosis Paternal Grandfather    Hypertension Paternal Grandmother    Aneurysm Paternal Grandmother    Cirrhosis Maternal Grandmother    Cirrhosis Maternal Grandfather    Cancer Father    Cirrhosis Father    Cirrhosis Mother    Breast cancer Sister    Hypertension Sister    Bronchitis Daughter    Bronchitis Daughter    Asthma Son    Bronchitis Son    Migraines Neg Hx     Social History   Tobacco Use   Smoking status: Former    Types: Cigarettes    Quit date: 2016    Years since quitting: 6.6   Smokeless tobacco: Never   Tobacco comments:    wears nicotine patches  Vaping Use   Vaping Use: Never used  Substance Use Topics   Alcohol use: Not Currently   Drug use: Not Currently    Types: Cocaine    Comment: crack  last used 2016    Home Medications Prior to Admission medications   Medication Sig Start Date End Date Taking? Authorizing Provider  amLODipine (NORVASC) 5 MG tablet TAKE 1 TABLET BY MOUTH ONCE A DAY FOR BLOOD PRESSURE. Patient taking differently: Take 5 mg by mouth daily. 10/26/20  Yes Lindell Spar, MD  apixaban (ELIQUIS) 5 MG TABS tablet Take 1 tablet (5 mg total) by mouth 2 (two) times daily. 09/07/20  Yes Costella, Vista Mink, PA-C  budesonide-formoterol (SYMBICORT) 80-4.5 MCG/ACT inhaler Inhale 2 puffs into the lungs 2 (two) times daily. 03/19/20  Yes Lindell Spar, MD  cephALEXin (KEFLEX) 500 MG capsule Take 1 capsule (500 mg total) by mouth 2 (two) times daily for 7 days. 12/22/20 12/29/20 Yes Marcello Fennel, PA-C  citalopram (CELEXA) 20 MG tablet Take 1 tablet (20 mg total) by mouth  daily. 11/18/20  Yes Lindell Spar, MD  cloNIDine (CATAPRES) 0.1 MG tablet TAKE 1 TABLET BY MOUTH ONCE DAILY FOR BLOOD PRESSURE AND ANXIETY. Patient taking differently: Take 0.1 mg by mouth daily. 06/02/20  Yes Lindell Spar, MD  Cyanocobalamin (VITAMIN B 12 PO) Take 1 tablet by mouth daily.   Yes [provider]  DULoxetine (CYMBALTA) 30 MG capsule Take 30 mg by mouth in the morning and at bedtime. 11/26/20  Yes [provider]  furosemide (LASIX) 20 MG tablet TAKE ONE TABLET BY MOUTH ONCE DAILY. Patient taking differently: Take 20 mg by mouth daily. 12/13/20  Yes Posey Pronto, Rutwik  K, MD  hydrOXYzine (VISTARIL) 50 MG capsule Take 1 capsule (50 mg total) by mouth daily as needed for anxiety (Insomnia). 11/18/20  Yes Lindell Spar, MD  predniSONE (STERAPRED UNI-PAK 21 TAB) 10 MG (21) TBPK tablet Take by mouth daily. Take 6 tabs by mouth daily  for 1  day, then 5 tabs for 1 day, then 4 tabs for 1 day, then 3 tabs for 1 day , 2 tabs for 1 days, then 1 tab by mouth daily for 1 day 12/22/20  Yes Marcello Fennel, PA-C  pregabalin (LYRICA) 150 MG capsule Take 150 mg by mouth 2 (two) times daily. 11/26/20  Yes [provider]  tiZANidine (ZANAFLEX) 2 MG tablet TAKE 1 TABLET BY MOUTH TWICE DAILY AS NEEDED FOR MUSCLE SPASMS Patient taking differently: Take 2 mg by mouth 2 (two) times daily as needed for muscle spasms. 12/10/20  Yes Lindell Spar, MD  albuterol (PROVENTIL HFA;VENTOLIN HFA) 108 (90 Base) MCG/ACT inhaler Inhale 2 puffs into the lungs every 6 (six) hours as needed for wheezing or shortness of breath. Patient not taking: No sig reported    [provider]  albuterol (PROVENTIL) (2.5 MG/3ML) 0.083% nebulizer solution Take 2.5 mg by nebulization every 6 (six) hours as needed for wheezing or shortness of breath. Patient not taking: No sig reported    [provider]  celecoxib (CELEBREX) 200 MG capsule Take 1 capsule (200 mg total) by mouth daily. Patient not  taking: No sig reported 11/23/20   Lindell Spar, MD  hydrocortisone (ANUSOL-HC) 2.5 % rectal cream Place 1 application rectally 2 (two) times daily. Patient not taking: No sig reported 11/18/20   Lindell Spar, MD  methylPREDNISolone (MEDROL DOSEPAK) 4 MG TBPK tablet Day 1: 8 mg PO before breakfast, 4 mg after lunch and after dinner, and 8 mg at bedtime  Day 2: 4 mg PO before breakfast, after lunch, and after dinner and 8 mg at bedtime  Day 3: 4 mg PO before breakfast, after lunch, after dinner, and at bedtime  Day 4: 4 mg PO before breakfast, after lunch, and at bedtime  Day 5: 4 mg PO before breakfast and at bedtime  Day 6: 4 mg PO before breakfast Patient not taking: No sig reported 12/17/20   Varney Biles, MD  omeprazole (PRILOSEC) 20 MG capsule TAKE 1 CAPSULE BY MOUTH ONCE A DAY. Patient not taking: No sig reported 12/13/20   Lindell Spar, MD  oxyCODONE-acetaminophen (PERCOCET/ROXICET) 5-325 MG tablet Take 1 tablet by mouth every 6 (six) hours as needed for severe pain. Patient not taking: No sig reported 12/16/20   Varney Biles, MD  potassium chloride SA (KLOR-CON) 20 MEQ tablet Take 1 tablet (20 mEq total) by mouth daily. Patient not taking: Reported on 12/22/2020 10/12/20   Fayrene Helper, MD  pregabalin (LYRICA) 75 MG capsule Take 1 capsule (75 mg total) by mouth 2 (two) times daily. Patient not taking: No sig reported 08/16/20   Lindell Spar, MD  Relugolix-Estradiol-Norethind (MYFEMBREE) 40-1-0.5 MG TABS Take 1 tablet by mouth daily. Patient not taking: No sig reported 08/06/20   Florian Buff, MD    Allergies    Clonopin [clonazepam], Fish allergy, Flexeril [cyclobenzaprine hcl], Ibuprofen, Shellfish allergy, Tylenol [acetaminophen], Ace inhibitors, Cyclobenzaprine, Tramadol, and Trazodone and nefazodone  Review of Systems   Review of Systems  Constitutional:  Negative for chills and fever.  HENT:  Negative for congestion.   Respiratory:  Negative for shortness of  breath.   Cardiovascular:  Negative for chest pain.  Gastrointestinal:  Positive for abdominal pain, constipation, nausea and vomiting.  Genitourinary:  Positive for difficulty urinating. Negative for enuresis, flank pain, frequency, vaginal bleeding and vaginal discharge.  Musculoskeletal:  Positive for back pain. Negative for myalgias.  Skin:  Negative for rash.  Neurological:  Negative for dizziness, weakness and numbness.  Hematological:  Does not bruise/bleed easily.   Physical Exam Updated Vital Signs BP 115/79   Pulse 85   Temp 97.8 F (36.6 C) (Oral)   Resp 20   Ht '4\' 11"'$  (1.499 m)   Wt 70.8 kg   LMP 06/07/2020   SpO2 93%   BMI 31.51 kg/m   Physical Exam Vitals and nursing note reviewed.  Constitutional:      General: She is not in acute distress.    Appearance: She is not ill-appearing.  HENT:     Head: Normocephalic and atraumatic.     Nose: No congestion.  Eyes:     Conjunctiva/sclera: Conjunctivae normal.  Cardiovascular:     Rate and Rhythm: Normal rate and regular rhythm.     Pulses: Normal pulses.     Heart sounds: No murmur heard.   No friction rub. No gallop.  Pulmonary:     Effort: No respiratory distress.     Breath sounds: No wheezing, rhonchi or rales.  Abdominal:     General: There is no distension.     Palpations: Abdomen is soft.     Tenderness: There is abdominal tenderness. There is no right CVA tenderness or left CVA tenderness.     Comments: Abdomen is nondistended, normal bowel sounds, dull to percussion, she has generalized tension palpation throughout her abdomen, does not vocalize there is no guarding, rebound tenderness, peritoneal sign, no CVA tenderness.  Musculoskeletal:     Comments: Patient has 4-5 strength in the lower extremities, neurovascular fully intact, patellar and Achilles tendon reflex intact.  Spine was palpated was tender to palpation within the lower lumbar region mainly on the perispinal region.  No step-off or  deformities present.  Skin:    General: Skin is warm and dry.  Neurological:     Mental Status: She is alert.  Psychiatric:        Mood and Affect: Mood normal.    ED Results / Procedures / Treatments   Labs (all labs ordered are listed, but only abnormal results are displayed) Labs Reviewed  COMPREHENSIVE METABOLIC PANEL - Abnormal; Notable for the following components:      Result Value   Glucose, Bld 105 (*)    Total Bilirubin <0.1 (*)    All other components within normal limits  CBC - Abnormal; Notable for the following components:   Hemoglobin 11.6 (*)    RDW 16.4 (*)    Platelets 632 (*)    All other components within normal limits  URINALYSIS, ROUTINE W REFLEX MICROSCOPIC - Abnormal; Notable for the following components:   APPearance HAZY (*)    Leukocytes,Ua TRACE (*)    All other components within normal limits  URINALYSIS, MICROSCOPIC (REFLEX) - Abnormal; Notable for the following components:   Bacteria, UA MANY (*)    All other components within normal limits  URINE CULTURE  LIPASE, BLOOD  PREGNANCY, URINE    EKG None  Radiology No results found.  Procedures Procedures   Medications Ordered in ED Medications  sodium chloride 0.9 % bolus 500 mL (0 mLs Intravenous Stopped 12/22/20 1035)  ondansetron (ZOFRAN) injection  4 mg (4 mg Intravenous Given 12/22/20 0837)  dicyclomine (BENTYL) injection 20 mg (20 mg Intramuscular Given 12/22/20 1035)  HYDROmorphone (DILAUDID) injection 1 mg (1 mg Intravenous Given 12/22/20 1033)  HYDROmorphone (DILAUDID) injection 1 mg (1 mg Intravenous Given 12/22/20 1139)    ED Course  I have reviewed the triage vital signs and the nursing notes.  Pertinent labs & imaging results that were available during my care of the patient were reviewed by me and considered in my medical decision making (see chart for details).    MDM Rules/Calculators/A&P                          Initial impression-patient presents with abdominal pain and  back pain.  She is alert, does not appear in acute stress, vital signs reassuring.  Will obtain basic lab work-up, provide patient with pain medications and reassess.  Work-up-bladder scan shows 92 mL.  CBC shows normocytic anemia appears to be at baseline for patient, CMP shows slight hyperglycemia 105, lipase is 38, urine pregnancy is negative, UA shows trace leukocytes, white blood cells, many bacteria.  Reassessment-patient was reassessed after Dilaudid, states she is feeling better, she was able to ambulate and urinate without difficulty.  Patient is reassessed again, she is tolerating p.o., abdomen soft nontender to palpation, back pain has improved, patient states she is ready for discharge.  Rule out- I have low suspicion for spinal fracture or spinal cord abnormality as patient denies urinary incontinency, difficulty with bowel movements, denies saddle paresthesias.  Spine was palpated there no step-off, crepitus or gross deformities felt, patient had 5/5 strength, full range of motion, neurovascular fully intact in the lower extremities.  Will defer imaging as there no significant trauma associated with patient's pain. low suspicion for septic arthritis as patient denies IV drug use, skin exam was performed no erythematous, edema or warm joints noted.  Low suspicion for [place], kidney stones she has no flank tenderness, vital signs reassuring, UA is negative for hematuria.  Patient does have noted many bacteria and white blood cells in her urine suspect this is what caused her to have some difficulty with urination, will culture urine and start her on antibiotics.  Low suspicion for bowel obstruction as abdomen is nondistended, patient is passing gas and having normal bowel movements.  She did have pain initially but this is since resolved suspect this is likely coming from her back pain.  Low suspicion for complicated diverticulitis as she has no leukocytosis, vital signs reassuring, denies  hematuria melena within her bowels.  Low suspicion for dissection and/or AAA as etiology is inconsistent with presentation, presentation is more consistent with her chronic lower back pain.   Plan-  Back pain-likely acute on chronic, will provide with a short course of steroids, follow-up with her neurosurgeon for further evaluation. Diffuse urination-likely secondary due to UTI, will start on antibiotics follow-up PCP for further evaluation.  Vital signs have remained stable, no indication for hospital admission.    Patient given at home care as well strict return precautions.  Patient verbalized that they understood agreed to said plan.  Final Clinical Impression(s) / ED Diagnoses Final diagnoses:  Lower urinary tract infectious disease  Chronic midline low back pain with bilateral sciatica    Rx / DC Orders ED Discharge Orders          Ordered    predniSONE (STERAPRED UNI-PAK 21 TAB) 10 MG (21) TBPK tablet  Daily  12/22/20 1221    cephALEXin (KEFLEX) 500 MG capsule  2 times daily        12/22/20 1222             Aron Baba 12/22/20 1223    Milton Ferguson, MD 12/25/20 1316

## 2020-12-22 NOTE — ED Notes (Signed)
Advised patient once again we needed urine specimen. Patient requesting pain medication. I advised her nurse/doctor were in with critical patient and I would advise them when they were free.

## 2020-12-22 NOTE — ED Triage Notes (Signed)
Pt presents to ED with complaints of lower abdominal pain, lower back pain since Monday. Pt states she has had difficulty urinating since yesterday. Pt states she has had vomiting, last time this am.

## 2020-12-23 ENCOUNTER — Telehealth: Payer: Self-pay

## 2020-12-23 ENCOUNTER — Ambulatory Visit (HOSPITAL_COMMUNITY): Payer: Medicaid Other | Admitting: Physical Therapy

## 2020-12-23 NOTE — Telephone Encounter (Signed)
Transition Care Management Unsuccessful Follow-up Telephone Call  Date of discharge and from where:  12/22/2020-[facility]   Attempts:  1st Attempt  Reason for unsuccessful TCM follow-up call:  Left voice message

## 2020-12-24 ENCOUNTER — Other Ambulatory Visit: Payer: Self-pay | Admitting: *Deleted

## 2020-12-24 NOTE — Patient Instructions (Signed)
Visit Information  Ms. Ferdinand Lango  - as a part of your Medicaid benefit, you are eligible for care management and care coordination services at no cost or copay. I was unable to reach you by phone today but would be happy to help you with your health related needs. Please feel free to call me @ [phone].   A member of the Managed Medicaid care management team will reach out to you again over the next 14 days.   Lurena Joiner RN, BSN [place]  [employer] manager

## 2020-12-24 NOTE — Telephone Encounter (Signed)
Transition Care Management Unsuccessful Follow-up Telephone Call  Date of discharge and from where:  12/22/2020-[facility]   Attempts:  2nd Attempt  Reason for unsuccessful TCM follow-up call:  Left voice message

## 2020-12-24 NOTE — Patient Outreach (Signed)
Care Coordination  12/24/2020  KAMIYLAH MEECE 1970-04-18 [id number]   Medicaid Managed Care   Unsuccessful Outreach Note  12/24/2020 Name: TOMARA LASH MRN: [id number] DOB: June 10, 1970  Referred by: Lindell Spar, MD Reason for referral : High Risk Managed Medicaid (Unsuccessful RNCM initial outreach)   A second unsuccessful telephone outreach was attempted today. The patient was referred to the case management team for assistance with care management and care coordination.   Follow Up Plan: A HIPAA compliant phone message was left for the patient providing contact information and requesting a return call.   Lurena Joiner RN, BSN [place]  [employer] manager

## 2020-12-25 ENCOUNTER — Other Ambulatory Visit: Payer: Self-pay | Admitting: Internal Medicine

## 2020-12-25 DIAGNOSIS — F332 Major depressive disorder, recurrent severe without psychotic features: Secondary | ICD-10-CM

## 2020-12-25 LAB — URINE CULTURE: Culture: >=100000 — AB

## 2020-12-26 ENCOUNTER — Telehealth: Payer: Self-pay | Admitting: *Deleted

## 2020-12-26 NOTE — Telephone Encounter (Signed)
Post ED Visit - Positive Culture Follow-up  Culture report reviewed by antimicrobial stewardship pharmacist: [place] Team '[]'$  Nathan Batchelder, Pharm.D. '[]'$  [id number], Pharm.D., BCPS AQ-ID '[]'$  Heide Guile, Pharm.D., BCPS '[]'$  [place]une, Pharm.D., BCPS '[]'$  [place], Pharm.D., BCPS, AAHIVP '[]'$  [place], Pharm.D., BCPS, AAHIVP '[]'$  Legrand Como, PharmD, BCPS '[]'$  Salome Arnt, PharmD, BCPS '[]'$  Johnnette Gourd, PharmD, BCPS '[]'$  Hughes Better, PharmD '[]'$  Leeroy Cha, PharmD, BCPS '[]'$  Laqueta Linden, PharmD  [place] Team '[]'$  [address], PharmD '[]'$  Leodis Sias, PharmD '[]'$  Lindell Spar, PharmD '[]'$  Royetta Asal, Rph '[]'$  Graylin Shiver) Rema Fendt, PharmD '[]'$  Glennon Mac, PharmD '[]'$  Arlyn Dunning, PharmD '[]'$  Netta Cedars, PharmD '[]'$  Dia Sitter, PharmD '[]'$  Leone Haven, PharmD '[]'$  Gretta Arab, PharmD '[]'$  Theodis Shove, PharmD '[]'$  Peggyann Juba, PharmD   Positive urine culture Treated with Cephalexin, organism sensitive to the same and no further patient follow-up is required at this time. Reuel Boom, PharmD  Lorelei Pont Talley 12/26/2020, 3:05 PM

## 2020-12-27 ENCOUNTER — Ambulatory Visit (HOSPITAL_COMMUNITY): Payer: Medicaid Other | Attending: Internal Medicine | Admitting: Physical Therapy

## 2020-12-27 ENCOUNTER — Telehealth (HOSPITAL_COMMUNITY): Payer: Self-pay | Admitting: Physical Therapy

## 2020-12-27 NOTE — Telephone Encounter (Signed)
Transition Care Management Unsuccessful Follow-up Telephone Call  Date of discharge and from where:  12/22/2020 from [facility]  Attempts:  3rd Attempt  Reason for unsuccessful TCM follow-up call:  Unable to reach patient

## 2020-12-27 NOTE — Telephone Encounter (Signed)
No Show.  Do to high no show/cancellation and current policy will formally discharge pt from Pt. Patient will need new order to return to therapy.  9:37 AM, 12/27/20 Jerene Pitch, DPT Physical Therapy with [facility]  [phone] office

## 2021-01-07 ENCOUNTER — Other Ambulatory Visit: Payer: Self-pay | Admitting: Internal Medicine

## 2021-01-10 ENCOUNTER — Other Ambulatory Visit: Payer: Self-pay | Admitting: *Deleted

## 2021-01-10 DIAGNOSIS — M4316 Spondylolisthesis, lumbar region: Secondary | ICD-10-CM | POA: Diagnosis not present

## 2021-01-10 DIAGNOSIS — Z6838 Body mass index (BMI) 38.0-38.9, adult: Secondary | ICD-10-CM | POA: Diagnosis not present

## 2021-01-10 DIAGNOSIS — M48062 Spinal stenosis, lumbar region with neurogenic claudication: Secondary | ICD-10-CM | POA: Diagnosis not present

## 2021-01-10 NOTE — Patient Outreach (Signed)
Care Coordination  01/10/2021  Kathryn [name] 01-Jan-1971 [id number]   Medicaid Managed Care   Unsuccessful Outreach Note  01/10/2021 Name: Kathryn [name] MRN: [id number] DOB: 08-26-1970  Referred by: Lindell Spar, MD Reason for referral : High Risk Managed Medicaid (Unsuccessful RNCM initial outreach, 3rd attempt)   Third unsuccessful telephone outreach was attempted today. The patient was referred to the case management team for assistance with care management and care coordination. The patient's primary care provider has been notified of our unsuccessful attempts to make or maintain contact with the patient. The care management team is pleased to engage with this patient at any time in the future should he/she be interested in assistance from the care management team.   Follow Up Plan: We have been unable to make contact with the patient for follow up. The care management team is available to follow up with the patient after provider conversation with the patient regarding recommendation for care management engagement and subsequent re-referral to the care management team.   Lurena Joiner RN, BSN [place] RN Care Coordinator

## 2021-01-11 DIAGNOSIS — M1711 Unilateral primary osteoarthritis, right knee: Secondary | ICD-10-CM | POA: Diagnosis not present

## 2021-01-12 ENCOUNTER — Emergency Department (HOSPITAL_COMMUNITY): Payer: Medicaid Other

## 2021-01-12 ENCOUNTER — Emergency Department (HOSPITAL_COMMUNITY)
Admission: EM | Admit: 2021-01-12 | Discharge: 2021-01-12 | Disposition: A | Discharge status: Home / Self Care | Payer: Medicaid Other | Attending: Emergency Medicine | Admitting: Emergency Medicine

## 2021-01-12 ENCOUNTER — Other Ambulatory Visit: Payer: Self-pay

## 2021-01-12 ENCOUNTER — Encounter (HOSPITAL_COMMUNITY): Payer: Self-pay | Admitting: Emergency Medicine

## 2021-01-12 DIAGNOSIS — J4530 Mild persistent asthma, uncomplicated: Secondary | ICD-10-CM | POA: Insufficient documentation

## 2021-01-12 DIAGNOSIS — M5441 Lumbago with sciatica, right side: Secondary | ICD-10-CM | POA: Insufficient documentation

## 2021-01-12 DIAGNOSIS — Z87891 Personal history of nicotine dependence: Secondary | ICD-10-CM | POA: Diagnosis not present

## 2021-01-12 DIAGNOSIS — R300 Dysuria: Secondary | ICD-10-CM | POA: Insufficient documentation

## 2021-01-12 DIAGNOSIS — Z7951 Long term (current) use of inhaled steroids: Secondary | ICD-10-CM | POA: Diagnosis not present

## 2021-01-12 DIAGNOSIS — Z79899 Other long term (current) drug therapy: Secondary | ICD-10-CM | POA: Diagnosis not present

## 2021-01-12 DIAGNOSIS — M79651 Pain in right thigh: Secondary | ICD-10-CM | POA: Insufficient documentation

## 2021-01-12 DIAGNOSIS — J449 Chronic obstructive pulmonary disease, unspecified: Secondary | ICD-10-CM | POA: Diagnosis not present

## 2021-01-12 DIAGNOSIS — I10 Essential (primary) hypertension: Secondary | ICD-10-CM | POA: Insufficient documentation

## 2021-01-12 DIAGNOSIS — Z7901 Long term (current) use of anticoagulants: Secondary | ICD-10-CM | POA: Diagnosis not present

## 2021-01-12 DIAGNOSIS — R2241 Localized swelling, mass and lump, right lower limb: Secondary | ICD-10-CM | POA: Insufficient documentation

## 2021-01-12 LAB — BASIC METABOLIC PANEL
Anion gap: 7 (ref 5–15)
BUN: 10 mg/dL (ref 6–20)
CO2: 23 mmol/L (ref 22–32)
Calcium: 9 mg/dL (ref 8.9–10.3)
Chloride: 107 mmol/L (ref 98–111)
Creatinine, Ser: 0.63 mg/dL (ref 0.44–1.00)
GFR, Estimated: >60 mL/min (ref >60)
Glucose, Bld: 94 mg/dL (ref 70–99)
Potassium: 3.5 mmol/L (ref 3.5–5.1)
Sodium: 137 mmol/L (ref 135–145)

## 2021-01-12 LAB — CBC WITH DIFFERENTIAL/PLATELET
Abs Immature Granulocytes: 0.03 10*3/uL (ref 0.00–0.07)
Basophils Absolute: 0 10*3/uL (ref 0.0–0.1)
Basophils Relative: 0 %
Eosinophils Absolute: 0.1 10*3/uL (ref 0.0–0.5)
Eosinophils Relative: 1 %
HCT: 37.3 % (ref 36.0–46.0)
Hemoglobin: 12.3 g/dL (ref 12.0–15.0)
Immature Granulocytes: 0 %
Lymphocytes Relative: 40 %
Lymphs Abs: 2.8 10*3/uL (ref 0.7–4.0)
MCH: 28.9 pg (ref 26.0–34.0)
MCHC: 33 g/dL (ref 30.0–36.0)
MCV: 87.6 fL (ref 80.0–100.0)
Monocytes Absolute: 0.4 10*3/uL (ref 0.1–1.0)
Monocytes Relative: 5 %
Neutro Abs: 3.7 10*3/uL (ref 1.7–7.7)
Neutrophils Relative %: 54 %
Platelets: 592 10*3/uL — ABNORMAL HIGH (ref 150–400)
RBC: 4.26 MIL/uL (ref 3.87–5.11)
RDW: 15.5 % (ref 11.5–15.5)
WBC: 7 10*3/uL (ref 4.0–10.5)
nRBC: 0 % (ref 0.0–0.2)

## 2021-01-12 LAB — URINALYSIS, ROUTINE W REFLEX MICROSCOPIC
Bacteria, UA: NONE SEEN
Bilirubin Urine: NEGATIVE
Glucose, UA: NEGATIVE mg/dL
Hgb urine dipstick: NEGATIVE
Ketones, ur: NEGATIVE mg/dL
Leukocytes,Ua: NEGATIVE
Nitrite: NEGATIVE
Protein, ur: 30 mg/dL — AB
Specific Gravity, Urine: 1.017 (ref 1.005–1.030)
pH: 5 (ref 5.0–8.0)

## 2021-01-12 LAB — D-DIMER, QUANTITATIVE: D-Dimer, Quant: 0.54 ug/mL-FEU — ABNORMAL HIGH (ref 0.00–0.50)

## 2021-01-12 MED ORDER — DEXAMETHASONE SODIUM PHOSPHATE 10 MG/ML IJ SOLN
10.0000 mg | Freq: Once | INTRAMUSCULAR | Status: AC
  Administered 2021-01-12: 10 mg via INTRAMUSCULAR
  Filled 2021-01-12: qty 1

## 2021-01-12 MED ORDER — ONDANSETRON HCL 4 MG/2ML IJ SOLN
4.0000 mg | Freq: Once | INTRAMUSCULAR | Status: AC
  Administered 2021-01-12: 4 mg via INTRAVENOUS
  Filled 2021-01-12: qty 2

## 2021-01-12 MED ORDER — HYDROMORPHONE HCL 1 MG/ML IJ SOLN
1.0000 mg | Freq: Once | INTRAMUSCULAR | Status: AC
  Administered 2021-01-12: 1 mg via INTRAVENOUS
  Filled 2021-01-12: qty 1

## 2021-01-12 MED ORDER — HYDROMORPHONE HCL 1 MG/ML IJ SOLN
0.5000 mg | Freq: Once | INTRAMUSCULAR | Status: AC
  Administered 2021-01-12: 0.5 mg via INTRAVENOUS
  Filled 2021-01-12: qty 1

## 2021-01-12 NOTE — ED Notes (Signed)
US at bedside

## 2021-01-12 NOTE — ED Triage Notes (Signed)
Pt to the ED with lower back pain that began on Monday. Pt states it is hard for her to urinate.

## 2021-01-12 NOTE — Discharge Instructions (Signed)
Please refer to the attached instructions. Follow-up with your primary care provider as needed.

## 2021-01-12 NOTE — ED Provider Notes (Signed)
[facility] EMERGENCY DEPARTMENT Provider Note   CSN: [id number] Arrival date & time: 01/12/21  1221     History Chief Complaint  Patient presents with   Back Pain    Kathryn [name] is a 50 y.o. female.  Patient presents for evaluation of acute on chronic back pain. This episode began on Monday. Pain originates in the lumbar spine and radiates to the right buttock and thigh. She states that she finds it difficult to initiate urination. Denies abdominal pain. She also reports swelling of right lower leg and foot. Her right calf is tender to palpation. She has been on eliquis, completed last dose on 01/07/21.  The history is provided by the patient and medical records. No language interpreter was used.  Back Pain Location:  Lumbar spine Quality:  Shooting and stabbing Pain severity:  Moderate Pain is:  Same all the time Onset quality:  Gradual Duration:  3 days Timing:  Constant Progression:  Worsening Chronicity:  Chronic Context: not falling, not recent illness and not recent injury   Associated symptoms: dysuria and leg pain   Associated symptoms: no abdominal pain, no bladder incontinence, no bowel incontinence and no fever       Past Medical History:  Diagnosis Date   Anxiety    Arthritis    Asthma    Bipolar disorder (HCC)    Chronic back pain    COPD (chronic obstructive pulmonary disease) (HCC)    Chronic bronchitis   Depression    GERD (gastroesophageal reflux disease)    Hypertension    Neuropathy    Osteoarthritis of left knee, 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   Sleep apnea 04/2020   GETTING A cpap   Suicidal ideation 12/24/2018    Patient Active Problem List   Diagnosis Date Noted   Bilateral leg edema 10/18/2020   Morbid obesity ([place]) 10/18/2020   S/P lumbar fusion 09/14/2020   Acute deep vein thrombosis (DVT) of proximal vein of lower extremity ([place]) 09/14/2020   Spondylolisthesis at L4-L5 level 03/29/2020   Insomnia 03/19/2020   Mild persistent asthma  without complication 60/01/9322   Need for immunization against influenza 03/19/2020   Body mass index (BMI) 38.0-38.9, adult 03/19/2020   GERD (gastroesophageal reflux disease) 03/19/2020   Intermittent palpitations 12/17/2019   Snoring 12/17/2019   Excessive daytime sleepiness 12/17/2019   Nightmare disorder, 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   Enlarged uterus 02/20/2018   Screening for colorectal cancer 02/20/2018   Osteoarthritis of left knee, 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   MDD (major depressive disorder), recurrent episode, severe ([place]) 12/24/2017   Low back pain, unspecified 05/11/2017   Low vitamin B12 level 05/11/2017   Primary hypertension 01/11/2017    Past Surgical History:  Procedure Laterality Date   BACK SURGERY     DILATATION AND CURETTAGE/HYSTEROSCOPY WITH MINERVA N/A 06/09/2020   Procedure: DILATATION AND CURETTAGE/HYSTEROSCOPY WITH MINERVA;  Surgeon: Florian Buff, MD;  Location: [facility];  Service: Gynecology;  Laterality: N/A;   ESOPHAGOGASTRODUODENOSCOPY (EGD) WITH PROPOFOL N/A 12/25/2018   Procedure: ESOPHAGOGASTRODUODENOSCOPY (EGD) WITH PROPOFOL;  Surgeon: Rogene Houston, MD;  Location: [facility] SUITE;  Service: Endoscopy;  Laterality: N/A;   PATELLA-FEMORAL ARTHROPLASTY Left 10/08/2018   Procedure: PATELLA-FEMORAL ARTHROPLASTY;  Surgeon: Marchia Bond, MD;  Location: [facility];  Service: Orthopedics;  Laterality: Left;   TUBAL LIGATION       OB History     Gravida  5   Para  3   Term  2   Preterm  1   AB  2   Living  3      SAB  1   IAB  1   Ectopic      Multiple      Live Births              Family History  Problem Relation Age of Onset   Gout Paternal Grandfather    Cirrhosis Paternal Grandfather    Hypertension Paternal Grandmother    Aneurysm Paternal Grandmother    Cirrhosis Maternal Grandmother    Cirrhosis Maternal Grandfather    Cancer Father    Cirrhosis Father    Cirrhosis Mother    Breast cancer Sister    Hypertension  Sister    Bronchitis Daughter    Bronchitis Daughter    Asthma Son    Bronchitis Son    Migraines Neg Hx     Social History   Tobacco Use   Smoking status: Former    Types: Cigarettes    Quit date: 2016    Years since quitting: 6.7   Smokeless tobacco: Never   Tobacco comments:    wears nicotine patches  Vaping Use   Vaping Use: Never used  Substance Use Topics   Alcohol use: Not Currently   Drug use: Not Currently    Types: Cocaine    Comment: crack  last used 2016    Home Medications Prior to Admission medications   Medication Sig Start Date End Date Taking? Authorizing Provider  albuterol (PROVENTIL HFA;VENTOLIN HFA) 108 (90 Base) MCG/ACT inhaler Inhale 2 puffs into the lungs every 6 (six) hours as needed for wheezing or shortness of breath.   Yes [provider]  albuterol (PROVENTIL) (2.5 MG/3ML) 0.083% nebulizer solution Take 2.5 mg by nebulization every 6 (six) hours as needed for wheezing or shortness of breath.   Yes [provider]  amLODipine (NORVASC) 5 MG tablet TAKE 1 TABLET BY MOUTH ONCE A DAY FOR BLOOD PRESSURE. Patient taking differently: Take 5 mg by mouth daily. 10/26/20  Yes Lindell Spar, MD  budesonide-formoterol [facility]) 80-4.5 MCG/ACT inhaler Inhale 2 puffs into the lungs 2 (two) times daily. 03/19/20  Yes Lindell Spar, MD  celecoxib (CELEBREX) 200 MG capsule Take 1 capsule (200 mg total) by mouth daily. 11/23/20  Yes Lindell Spar, MD  citalopram (CELEXA) 20 MG tablet TAKE ONE TABLET BY MOUTH ONCE DAILY. 12/27/20  Yes Lindell Spar, MD  cloNIDine (CATAPRES) 0.1 MG tablet TAKE 1 TABLET BY MOUTH ONCE DAILY FOR BLOOD PRESSURE AND ANXIETY. Patient taking differently: Take 0.1 mg by mouth daily. 06/02/20  Yes Lindell Spar, MD  Cyanocobalamin (VITAMIN B 12 PO) Take 1 tablet by mouth daily.   Yes [provider]  DULoxetine (CYMBALTA) 30 MG capsule Take 30 mg by mouth in the morning and at bedtime. 11/26/20  Yes [provider]  furosemide (LASIX) 20 MG tablet TAKE ONE TABLET BY MOUTH ONCE DAILY. Patient taking differently: Take 20 mg by mouth daily. 12/13/20  Yes Lindell Spar, MD  hydrOXYzine (VISTARIL) 50 MG capsule Take 1 capsule (50 mg total) by mouth daily as needed for anxiety (Insomnia). 11/18/20  Yes Lindell Spar, MD  omeprazole (PRILOSEC) 20 MG capsule TAKE 1 CAPSULE BY MOUTH ONCE A DAY. 12/13/20  Yes Patel, Colin Broach, MD  potassium chloride SA (KLOR-CON) 20 MEQ tablet Take 1 tablet (20 mEq total) by mouth daily. 10/12/20  Yes Fayrene Helper, MD  pregabalin (LYRICA) 150 MG capsule Take 150 mg  by mouth 2 (two) times daily. 11/26/20  Yes [provider]  apixaban (ELIQUIS) 5 MG TABS tablet Take 1 tablet (5 mg total) by mouth 2 (two) times daily. Patient not taking: Reported on 01/12/2021 09/07/20   Traci Sermon, PA-C  hydrocortisone (ANUSOL-HC) 2.5 % rectal cream Place 1 application rectally 2 (two) times daily. Patient not taking: No sig reported 11/18/20   Lindell Spar, MD  methylPREDNISolone (MEDROL DOSEPAK) 4 MG TBPK tablet Day 1: 8 mg PO before breakfast, 4 mg after lunch and after dinner, and 8 mg at bedtime  Day 2: 4 mg PO before breakfast, after lunch, and after dinner and 8 mg at bedtime  Day 3: 4 mg PO before breakfast, after lunch, after dinner, and at bedtime  Day 4: 4 mg PO before breakfast, after lunch, and at bedtime  Day 5: 4 mg PO before breakfast and at bedtime  Day 6: 4 mg PO before breakfast Patient not taking: No sig reported 12/17/20   Varney Biles, MD  oxyCODONE-acetaminophen (PERCOCET/ROXICET) 5-325 MG tablet Take 1 tablet by mouth every 6 (six) hours as needed for severe pain. Patient not taking: No sig reported 12/16/20   Varney Biles, MD  predniSONE (STERAPRED UNI-PAK 21 TAB) 10 MG (21) TBPK tablet Take by mouth daily. Take 6 tabs by mouth daily  for 1  day, then 5 tabs for 1 day, then 4 tabs for 1 day, then 3 tabs for 1 day , 2 tabs for 1 days,  then 1 tab by mouth daily for 1 day Patient not taking: Reported on 01/12/2021 12/22/20   Marcello Fennel, PA-C  pregabalin (LYRICA) 75 MG capsule Take 1 capsule (75 mg total) by mouth 2 (two) times daily. Patient not taking: No sig reported 08/16/20   Lindell Spar, MD  Relugolix-Estradiol-Norethind (MYFEMBREE) 40-1-0.5 MG TABS Take 1 tablet by mouth daily. Patient not taking: No sig reported 08/06/20   Florian Buff, MD  tiZANidine (ZANAFLEX) 2 MG tablet TAKE 1 TABLET BY MOUTH TWICE DAILY AS NEEDED FOR MUSCLE SPASMS Patient not taking: Reported on 01/12/2021 01/07/21   Lindell Spar, MD    Allergies    Clonopin [clonazepam], Fish allergy, Flexeril [cyclobenzaprine hcl], Ibuprofen, Shellfish allergy, Tylenol [acetaminophen], Ace inhibitors, Cyclobenzaprine, Tramadol, and Trazodone and nefazodone  Review of Systems   Review of Systems  Constitutional:  Negative for fever.  Gastrointestinal:  Negative for abdominal pain and bowel incontinence.  Genitourinary:  Positive for dysuria. Negative for bladder incontinence.  Musculoskeletal:  Positive for back pain.  All other systems reviewed and are negative.  Physical Exam Updated Vital Signs BP 130/85 (BP Location: Right Arm)   Pulse 85   Temp 98.1 F (36.7 C)   Resp 20   Ht 4\' 11"  (1.499 m)   Wt 70.8 kg   LMP 06/07/2020   SpO2 99%   BMI 31.51 kg/m   Physical Exam Constitutional:      Appearance: Normal appearance.  HENT:     Head: Normocephalic.     Nose: Nose normal.  Eyes:     Conjunctiva/sclera: Conjunctivae normal.  Cardiovascular:     Rate and Rhythm: Normal rate and regular rhythm.  Pulmonary:     Effort: Pulmonary effort is normal.     Breath sounds: Normal breath sounds.  Abdominal:     Palpations: Abdomen is soft.  Musculoskeletal:        General: Normal range of motion.     Cervical back: Normal range  of motion.     Lumbar back: Tenderness present.     Comments: Right sided low back pain with radiation to  right buttock and thigh.  Skin:    General: Skin is warm and dry.  Neurological:     Mental Status: She is alert and oriented to person, place, and time.  Psychiatric:        Mood and Affect: Mood normal.        Behavior: Behavior normal.    ED Results / Procedures / Treatments   Labs (all labs ordered are listed, but only abnormal results are displayed) Labs Reviewed  CBC WITH DIFFERENTIAL/PLATELET - Abnormal; Notable for the following components:      Result Value   Platelets 592 (*)    All other components within normal limits  D-DIMER, QUANTITATIVE - Abnormal; Notable for the following components:   D-Dimer, Quant 0.54 (*)    All other components within normal limits  BASIC METABOLIC PANEL  URINALYSIS, ROUTINE W REFLEX MICROSCOPIC    EKG None  Radiology US Venous Img Lower Right (DVT Study)  Result Date: 01/12/2021 CLINICAL DATA:  Right calf pain, edema EXAM: RIGHT LOWER EXTREMITY VENOUS DOPPLER ULTRASOUND TECHNIQUE: Gray-scale sonography with graded compression, as well as color Doppler and duplex ultrasound were performed to evaluate the lower extremity deep venous systems from the level of the common femoral vein and including the common femoral, femoral, profunda femoral, popliteal and calf veins including the posterior tibial, peroneal and gastrocnemius veins when visible. The superficial great saphenous vein was also interrogated. Spectral Doppler was utilized to evaluate flow at rest and with distal augmentation maneuvers in the common femoral, femoral and popliteal veins. COMPARISON:  None. FINDINGS: Contralateral Common Femoral Vein: Respiratory phasicity is normal and symmetric with the symptomatic side. No evidence of thrombus. Normal compressibility. Common Femoral Vein: No evidence of thrombus. Normal compressibility, respiratory phasicity and response to augmentation. Saphenofemoral Junction: No evidence of thrombus. Normal compressibility and flow on color Doppler  imaging. Profunda Femoral Vein: No evidence of thrombus. Normal compressibility and flow on color Doppler imaging. Femoral Vein: No evidence of thrombus. Normal compressibility, respiratory phasicity and response to augmentation. Popliteal Vein: No evidence of thrombus. Normal compressibility, respiratory phasicity and response to augmentation. Calf Veins: No evidence of thrombus. Normal compressibility and flow on color Doppler imaging. Other Findings:  None. IMPRESSION: No evidence of deep venous thrombosis. Electronically Signed   By: Albin Felling M.D.   On: 01/12/2021 15:49    Procedures Procedures   Medications Ordered in ED Medications  HYDROmorphone (DILAUDID) injection 0.5 mg (0.5 mg Intravenous Given 01/12/21 1355)  ondansetron (ZOFRAN) injection 4 mg (4 mg Intravenous Given 01/12/21 1352)  HYDROmorphone (DILAUDID) injection 1 mg (1 mg Intravenous Given 01/12/21 1549)  dexamethasone (DECADRON) injection 10 mg (10 mg Intramuscular Given 01/12/21 1757)    ED Course  I have reviewed the triage vital signs and the nursing notes.  Pertinent labs & imaging results that were available during my care of the patient were reviewed by me and considered in my medical decision making (see chart for details).   Pain improved after medication in ED.   MDM Rules/Calculators/A&P                           Patient with back pain.  No neurological deficits and normal neuro exam.  Patient is ambulatory.  No loss of bowel or bladder control.  No concern for cauda equina.  No fever, night sweats, weight loss, h/o cancer, IVDA, no recent procedure to back. No urinary symptoms suggestive of UTI.  Supportive care and return precaution discussed. Appears safe for discharge at this time. Follow up as indicated in discharge paperwork.    Mildly elevated d-dimer with right calf pain. No DVT detected. Final Clinical Impression(s) / ED Diagnoses Final diagnoses:  Acute right-sided low back pain with right-sided  sciatica    Rx / DC Orders ED Discharge Orders     None        Etta Quill, NP 01/12/21 1806    Hayden Rasmussen, MD 01/12/21 [phone]

## 2021-01-13 ENCOUNTER — Telehealth: Payer: Self-pay

## 2021-01-13 ENCOUNTER — Other Ambulatory Visit: Payer: Self-pay | Admitting: Internal Medicine

## 2021-01-13 NOTE — Telephone Encounter (Signed)
Transition Care Management Unsuccessful Follow-up Telephone Call  Date of discharge and from where:  01/12/2021-[place]   Attempts:  1st Attempt  Reason for unsuccessful TCM follow-up call:  Voice mail full

## 2021-01-14 ENCOUNTER — Telehealth: Payer: Self-pay | Admitting: Internal Medicine

## 2021-01-14 NOTE — Telephone Encounter (Signed)
Patient called in reference to Lyrica perception being sent in

## 2021-01-16 ENCOUNTER — Emergency Department (HOSPITAL_COMMUNITY): Payer: 59

## 2021-01-16 ENCOUNTER — Other Ambulatory Visit: Payer: Self-pay

## 2021-01-16 ENCOUNTER — Emergency Department (HOSPITAL_COMMUNITY)
Admission: EM | Admit: 2021-01-16 | Discharge: 2021-01-17 | Disposition: A | Discharge status: Home / Self Care | Payer: 59 | Attending: Emergency Medicine | Admitting: Emergency Medicine

## 2021-01-16 DIAGNOSIS — N83201 Unspecified ovarian cyst, right side: Secondary | ICD-10-CM | POA: Diagnosis not present

## 2021-01-16 DIAGNOSIS — J45909 Unspecified asthma, uncomplicated: Secondary | ICD-10-CM | POA: Insufficient documentation

## 2021-01-16 DIAGNOSIS — Z79899 Other long term (current) drug therapy: Secondary | ICD-10-CM | POA: Diagnosis not present

## 2021-01-16 DIAGNOSIS — W19XXXA Unspecified fall, initial encounter: Secondary | ICD-10-CM

## 2021-01-16 DIAGNOSIS — M791 Myalgia, unspecified site: Secondary | ICD-10-CM | POA: Diagnosis not present

## 2021-01-16 DIAGNOSIS — Z7902 Long term (current) use of antithrombotics/antiplatelets: Secondary | ICD-10-CM | POA: Diagnosis not present

## 2021-01-16 DIAGNOSIS — R531 Weakness: Secondary | ICD-10-CM | POA: Insufficient documentation

## 2021-01-16 DIAGNOSIS — R39198 Other difficulties with micturition: Secondary | ICD-10-CM | POA: Insufficient documentation

## 2021-01-16 DIAGNOSIS — Z87891 Personal history of nicotine dependence: Secondary | ICD-10-CM | POA: Diagnosis not present

## 2021-01-16 DIAGNOSIS — R200 Anesthesia of skin: Secondary | ICD-10-CM | POA: Diagnosis not present

## 2021-01-16 DIAGNOSIS — M5126 Other intervertebral disc displacement, lumbar region: Secondary | ICD-10-CM | POA: Diagnosis not present

## 2021-01-16 DIAGNOSIS — I10 Essential (primary) hypertension: Secondary | ICD-10-CM | POA: Diagnosis not present

## 2021-01-16 DIAGNOSIS — J449 Chronic obstructive pulmonary disease, unspecified: Secondary | ICD-10-CM | POA: Diagnosis not present

## 2021-01-16 DIAGNOSIS — M545 Low back pain, unspecified: Secondary | ICD-10-CM | POA: Insufficient documentation

## 2021-01-16 DIAGNOSIS — E882 Lipomatosis, not elsewhere classified: Secondary | ICD-10-CM | POA: Diagnosis not present

## 2021-01-16 DIAGNOSIS — M5459 Other low back pain: Secondary | ICD-10-CM | POA: Diagnosis not present

## 2021-01-16 DIAGNOSIS — R296 Repeated falls: Secondary | ICD-10-CM | POA: Diagnosis not present

## 2021-01-16 DIAGNOSIS — G8929 Other chronic pain: Secondary | ICD-10-CM

## 2021-01-16 DIAGNOSIS — N83202 Unspecified ovarian cyst, left side: Secondary | ICD-10-CM | POA: Diagnosis not present

## 2021-01-16 LAB — CBC WITH DIFFERENTIAL/PLATELET
Abs Immature Granulocytes: 0.07 10*3/uL (ref 0.00–0.07)
Basophils Absolute: 0 10*3/uL (ref 0.0–0.1)
Basophils Relative: 0 %
Eosinophils Absolute: 0 10*3/uL (ref 0.0–0.5)
Eosinophils Relative: 0 %
HCT: 37.7 % (ref 36.0–46.0)
Hemoglobin: 12.4 g/dL (ref 12.0–15.0)
Immature Granulocytes: 1 %
Lymphocytes Relative: 14 %
Lymphs Abs: 1.7 10*3/uL (ref 0.7–4.0)
MCH: 28.3 pg (ref 26.0–34.0)
MCHC: 32.9 g/dL (ref 30.0–36.0)
MCV: 86.1 fL (ref 80.0–100.0)
Monocytes Absolute: 0.5 10*3/uL (ref 0.1–1.0)
Monocytes Relative: 4 %
Neutro Abs: 9.4 10*3/uL — ABNORMAL HIGH (ref 1.7–7.7)
Neutrophils Relative %: 81 %
Platelets: 674 10*3/uL — ABNORMAL HIGH (ref 150–400)
RBC: 4.38 MIL/uL (ref 3.87–5.11)
RDW: 15.4 % (ref 11.5–15.5)
WBC: 11.7 10*3/uL — ABNORMAL HIGH (ref 4.0–10.5)
nRBC: 0 % (ref 0.0–0.2)

## 2021-01-16 LAB — BASIC METABOLIC PANEL
Anion gap: 7 (ref 5–15)
BUN: 18 mg/dL (ref 6–20)
CO2: 27 mmol/L (ref 22–32)
Calcium: 9.2 mg/dL (ref 8.9–10.3)
Chloride: 104 mmol/L (ref 98–111)
Creatinine, Ser: 0.77 mg/dL (ref 0.44–1.00)
GFR, Estimated: >60 mL/min (ref >60)
Glucose, Bld: 99 mg/dL (ref 70–99)
Potassium: 4.1 mmol/L (ref 3.5–5.1)
Sodium: 138 mmol/L (ref 135–145)

## 2021-01-16 MED ORDER — HYDROMORPHONE HCL 1 MG/ML IJ SOLN
1.0000 mg | Freq: Once | INTRAMUSCULAR | Status: AC
Start: 2021-01-16 — End: 2021-01-16
  Administered 2021-01-16: 1 mg via INTRAVENOUS
  Filled 2021-01-16: qty 1

## 2021-01-16 MED ORDER — HYDROMORPHONE HCL 1 MG/ML IJ SOLN
1.0000 mg | Freq: Once | INTRAMUSCULAR | Status: AC
  Administered 2021-01-16: 1 mg via INTRAVENOUS
  Filled 2021-01-16: qty 1

## 2021-01-16 MED ORDER — DIPHENHYDRAMINE HCL 50 MG/ML IJ SOLN: 25.0000 mg | Freq: Once | INTRAMUSCULAR | Status: DC

## 2021-01-16 MED ORDER — DEXAMETHASONE SODIUM PHOSPHATE 10 MG/ML IJ SOLN
10.0000 mg | Freq: Once | INTRAMUSCULAR | Status: AC
  Administered 2021-01-16: 10 mg via INTRAVENOUS
  Filled 2021-01-16: qty 1

## 2021-01-16 MED ORDER — METHOCARBAMOL 500 MG PO TABS
500.0000 mg | ORAL_TABLET | Freq: Once | ORAL | Status: AC
  Administered 2021-01-16: 500 mg via ORAL
  Filled 2021-01-16: qty 1

## 2021-01-16 MED ORDER — HYDROXYZINE HCL 25 MG PO TABS
50.0000 mg | ORAL_TABLET | Freq: Once | ORAL | Status: AC
  Administered 2021-01-16: 50 mg via ORAL
  Filled 2021-01-16: qty 2

## 2021-01-16 NOTE — ED Provider Notes (Signed)
[facility] Provider Note   CSN: [id number] Arrival date & time: 01/16/21  1152     History Chief Complaint  Patient presents with   Back Pain    Kathryn [name] is a 50 y.o. female.  Kathryn [name] is a 50 y.o. female with a history of hypertension, GERD, COPD, chronic back pain s/p L3, L4 complete laminectomy with radical facetectomy for decompression and interbody fusion with Dr. Kathyrn Sheriff, who presents today with acute worsening of back pain.  Patient reports that she was seen in the ED recently and treated for acute back pain and pain improved in the ED, but upon returning home she has had persistent worsening of her pain.  She also reports that her legs have been giving out frequently causing falls.  She most recently fell yesterday, did not hit her head during the fall.  Fell back onto her bottom.  She also reports that she has had progressive bili increasing difficulty with urination.  She reports that she feels like she has to strain to urinate and does not completely empty her bladder.  She also reports over the past few weeks she has noticed that she cannot feel whether she has completed her bowel movement she only knows because she hears the stool hit the toilet.  She also reports that she has had some numbness in her genital area.  States that she has had progressively worsening numbness in her bilateral lower extremities starting in her feet and now extending up to the mid shin level.  She reports that with touch she primarily feels pins and needle sensation.  She reports that she saw her neurosurgeon in the clinic last week and she reported that they wanted to schedule her for an outpatient MRI but she has not heard anything and since then has had continued worsening symptoms and had a fall yesterday.  The history is provided by the patient.        Past Medical History:  Diagnosis Date   Anxiety    Arthritis    Asthma    Bipolar disorder ([place])     Chronic back pain    COPD (chronic obstructive pulmonary disease) (HCC)    Chronic bronchitis   Depression    GERD (gastroesophageal reflux disease)    Hypertension    Neuropathy    Osteoarthritis of left knee, 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   Sleep apnea 04/2020   GETTING A cpap   Suicidal ideation 12/24/2018    Patient Active Problem List   Diagnosis Date Noted   Bilateral leg edema 10/18/2020   Morbid obesity ([place]) 10/18/2020   S/P lumbar fusion 09/14/2020   Acute deep vein thrombosis (DVT) of proximal vein of lower extremity ([place]) 09/14/2020   Spondylolisthesis at L4-L5 level 03/29/2020   Insomnia 03/19/2020   Mild persistent asthma without complication 26/94/8546   Need for immunization against influenza 03/19/2020   Body mass index (BMI) 38.0-38.9, adult 03/19/2020   GERD (gastroesophageal reflux disease) 03/19/2020   Intermittent palpitations 12/17/2019   Snoring 12/17/2019   Excessive daytime sleepiness 12/17/2019   Nightmare disorder, 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   Enlarged uterus 02/20/2018   Screening for colorectal cancer 02/20/2018   Osteoarthritis of left knee, 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   MDD (major depressive disorder), recurrent episode, severe ([place]) 12/24/2017   Low back pain, unspecified 05/11/2017   Low vitamin B12 level 05/11/2017   Primary hypertension 01/11/2017    Past Surgical History:  Procedure Laterality Date   BACK SURGERY  DILATATION AND CURETTAGE/HYSTEROSCOPY WITH MINERVA N/A 06/09/2020   Procedure: DILATATION AND CURETTAGE/HYSTEROSCOPY WITH MINERVA;  Surgeon: Florian Buff, MD;  Location: [facility];  Service: Gynecology;  Laterality: N/A;   ESOPHAGOGASTRODUODENOSCOPY (EGD) WITH PROPOFOL N/A 12/25/2018   Procedure: ESOPHAGOGASTRODUODENOSCOPY (EGD) WITH PROPOFOL;  Surgeon: Rogene Houston, MD;  Location: [facility];  Service: Endoscopy;  Laterality: N/A;   PATELLA-FEMORAL ARTHROPLASTY Left 10/08/2018   Procedure: PATELLA-FEMORAL ARTHROPLASTY;   Surgeon: Marchia Bond, MD;  Location: [facility];  Service: Orthopedics;  Laterality: Left;   TUBAL LIGATION       OB History     Gravida  5   Para  3   Term  2   Preterm  1   AB  2   Living  3      SAB  1   IAB  1   Ectopic      Multiple      Live Births              Family History  Problem Relation Age of Onset   Gout Paternal Grandfather    Cirrhosis Paternal Grandfather    Hypertension Paternal Grandmother    Aneurysm Paternal Grandmother    Cirrhosis Maternal Grandmother    Cirrhosis Maternal Grandfather    Cancer Father    Cirrhosis Father    Cirrhosis Mother    Breast cancer Sister    Hypertension Sister    Bronchitis Daughter    Bronchitis Daughter    Asthma Son    Bronchitis Son    Migraines Neg Hx     Social History   Tobacco Use   Smoking status: Former    Types: Cigarettes    Quit date: 2016    Years since quitting: 6.7   Smokeless tobacco: Never   Tobacco comments:    wears nicotine patches  Vaping Use   Vaping Use: Never used  Substance Use Topics   Alcohol use: Not Currently   Drug use: Not Currently    Types: Cocaine    Comment: crack  last used 2016    Home Medications Prior to Admission medications   Medication Sig Start Date End Date Taking? Authorizing Provider  albuterol (PROVENTIL HFA;VENTOLIN HFA) 108 (90 Base) MCG/ACT inhaler Inhale 2 puffs into the lungs every 6 (six) hours as needed for wheezing or shortness of breath.    [provider]  albuterol (PROVENTIL) (2.5 MG/3ML) 0.083% nebulizer solution Take 2.5 mg by nebulization every 6 (six) hours as needed for wheezing or shortness of breath.    [provider]  amLODipine (NORVASC) 5 MG tablet TAKE 1 TABLET BY MOUTH ONCE A DAY FOR BLOOD PRESSURE. Patient taking differently: Take 5 mg by mouth daily. 10/26/20   Lindell Spar, MD  apixaban (ELIQUIS) 5 MG TABS tablet Take 1 tablet (5 mg total) by mouth 2 (two) times daily. Patient not taking:  Reported on 01/12/2021 09/07/20   Traci Sermon, PA-C  budesonide-formoterol (SYMBICORT) 80-4.5 MCG/ACT inhaler Inhale 2 puffs into the lungs 2 (two) times daily. 03/19/20   Lindell Spar, MD  celecoxib (CELEBREX) 200 MG capsule Take 1 capsule (200 mg total) by mouth daily. 11/23/20   Lindell Spar, MD  citalopram (CELEXA) 20 MG tablet TAKE ONE TABLET BY MOUTH ONCE DAILY. 12/27/20   Lindell Spar, MD  cloNIDine (CATAPRES) 0.1 MG tablet TAKE 1 TABLET BY MOUTH ONCE DAILY FOR BLOOD PRESSURE AND ANXIETY. 01/13/21   Lindell Spar, MD  Cyanocobalamin (VITAMIN B 12 PO) Take 1 tablet by mouth daily.    [provider]  DULoxetine (CYMBALTA) 30 MG capsule Take 30 mg by mouth in the morning and at bedtime. 11/26/20   [provider]  furosemide (LASIX) 20 MG tablet TAKE ONE TABLET BY MOUTH ONCE DAILY. Patient taking differently: Take 20 mg by mouth daily. 12/13/20   Lindell Spar, MD  hydrocortisone (ANUSOL-HC) 2.5 % rectal cream Place 1 application rectally 2 (two) times daily. Patient not taking: No sig reported 11/18/20   Lindell Spar, MD  hydrOXYzine (VISTARIL) 50 MG capsule Take 1 capsule (50 mg total) by mouth daily as needed for anxiety (Insomnia). 11/18/20   Lindell Spar, MD  methylPREDNISolone (MEDROL DOSEPAK) 4 MG TBPK tablet Day 1: 8 mg PO before breakfast, 4 mg after lunch and after dinner, and 8 mg at bedtime  Day 2: 4 mg PO before breakfast, after lunch, and after dinner and 8 mg at bedtime  Day 3: 4 mg PO before breakfast, after lunch, after dinner, and at bedtime  Day 4: 4 mg PO before breakfast, after lunch, and at bedtime  Day 5: 4 mg PO before breakfast and at bedtime  Day 6: 4 mg PO before breakfast Patient not taking: No sig reported 12/17/20   Varney Biles, MD  omeprazole (PRILOSEC) 20 MG capsule TAKE 1 CAPSULE BY MOUTH ONCE A DAY. 12/13/20   Lindell Spar, MD  oxyCODONE-acetaminophen (PERCOCET/ROXICET) 5-325 MG tablet Take 1 tablet by mouth every 6  (six) hours as needed for severe pain. Patient not taking: No sig reported 12/16/20   Varney Biles, MD  potassium chloride SA (KLOR-CON) 20 MEQ tablet Take 1 tablet (20 mEq total) by mouth daily. 10/12/20   Fayrene Helper, MD  predniSONE (STERAPRED UNI-PAK 21 TAB) 10 MG (21) TBPK tablet Take by mouth daily. Take 6 tabs by mouth daily  for 1  day, then 5 tabs for 1 day, then 4 tabs for 1 day, then 3 tabs for 1 day , 2 tabs for 1 days, then 1 tab by mouth daily for 1 day Patient not taking: Reported on 01/12/2021 12/22/20   Marcello Fennel, PA-C  pregabalin (LYRICA) 150 MG capsule Take 150 mg by mouth 2 (two) times daily. 11/26/20   [provider]  pregabalin (LYRICA) 75 MG capsule Take 1 capsule (75 mg total) by mouth 2 (two) times daily. Patient not taking: No sig reported 08/16/20   Lindell Spar, MD  Relugolix-Estradiol-Norethind (MYFEMBREE) 40-1-0.5 MG TABS Take 1 tablet by mouth daily. Patient not taking: No sig reported 08/06/20   Florian Buff, MD  tiZANidine (ZANAFLEX) 2 MG tablet TAKE 1 TABLET BY MOUTH TWICE DAILY AS NEEDED FOR MUSCLE SPASMS Patient not taking: Reported on 01/12/2021 01/07/21   Lindell Spar, MD    Allergies    Clonopin [clonazepam], Fish allergy, Flexeril [cyclobenzaprine hcl], Ibuprofen, Shellfish allergy, Tylenol [acetaminophen], Ace inhibitors, Cyclobenzaprine, Tramadol, and Trazodone and nefazodone  Review of Systems   Review of Systems  Constitutional:  Negative for chills and fever.  HENT: Negative.    Respiratory:  Negative for cough and shortness of breath.   Cardiovascular:  Negative for chest pain.  Gastrointestinal:  Negative for abdominal pain, constipation and diarrhea.  Genitourinary:  Positive for difficulty urinating. Negative for dysuria and frequency.  Musculoskeletal:  Positive for back pain. Negative for arthralgias and myalgias.  Skin:  Negative for color change and rash.  Neurological:  Positive  for weakness and numbness.  Negative for dizziness and light-headedness.  All other systems reviewed and are negative.  Physical Exam Updated Vital Signs BP 113/79 (BP Location: Right Arm)   Pulse 89   Temp 98.1 F (36.7 C) (Oral)   Resp 17   Ht 4\' 11"  (1.499 m)   LMP 06/07/2020   SpO2 97%   BMI 31.51 kg/m   Physical Exam Vitals and nursing note reviewed.  Constitutional:      General: She is not in acute distress.    Appearance: Normal appearance. She is well-developed. She is not ill-appearing or diaphoretic.  HENT:     Head: Atraumatic.  Eyes:     General:        Right eye: No discharge.        Left eye: No discharge.  Cardiovascular:     Rate and Rhythm: Normal rate and regular rhythm.     Pulses:          Radial pulses are 2+ on the right side and 2+ on the left side.       Dorsalis pedis pulses are 2+ on the right side and 2+ on the left side.       Posterior tibial pulses are 2+ on the right side and 2+ on the left side.  Pulmonary:     Effort: Pulmonary effort is normal. No respiratory distress.     Breath sounds: Normal breath sounds.  Abdominal:     General: Bowel sounds are normal. There is no distension.     Palpations: Abdomen is soft. There is no mass.     Tenderness: There is no abdominal tenderness. There is no guarding.     Comments: Abdomen soft, nondistended, nontender to palpation in all quadrants without guarding or peritoneal signs, no CVA tenderness bilaterally  Musculoskeletal:        General: Tenderness present.     Cervical back: Neck supple.     Comments: Tenderness across the low back, most severe at midline without palpable deformity or overlying skin changes, pain elicited with straight leg raise bilaterally.  Skin:    General: Skin is warm and dry.     Capillary Refill: Capillary refill takes less than 2 seconds.  Neurological:     Mental Status: She is alert and oriented to person, place, and time.     Comments: Alert, clear speech, following commands. Moving  all extremities without difficulty. 4/5 strength in bilateral lower extremities.   Lateral lower extremities with decreased sensation below the knee, patient reports that on the right foot she does not feel light touch, and reports pins and needle sensation with touch to bilateral calfs. 2+ patellar DTRs bilaterally.  Psychiatric:        Behavior: Behavior normal.    ED Results / Procedures / Treatments   Labs (all labs ordered are listed, but only abnormal results are displayed) Labs Reviewed  CBC WITH DIFFERENTIAL/PLATELET - Abnormal; Notable for the following components:      Result Value   WBC 11.7 (*)    Platelets 674 (*)    Neutro Abs 9.4 (*)    All other components within normal limits  BASIC METABOLIC PANEL    EKG None  Radiology No results found.  Procedures Procedures   Medications Ordered in ED Medications  HYDROmorphone (DILAUDID) injection 1 mg (has no administration in time range)  methocarbamol (ROBAXIN) tablet 500 mg (has no administration in time range)  dexamethasone (DECADRON) injection 10 mg (has  no administration in time range)    ED Course  I have reviewed the triage vital signs and the nursing notes.  Pertinent labs & imaging results that were available during my care of the patient were reviewed by me and considered in my medical decision making (see chart for details).    MDM Rules/Calculators/A&P                           50 year old female with prior decompressive back surgery presenting with acute on chronic back pain.  She reports many concerning symptoms including new decreased sensation in her bilateral lower legs.  She also reports that she has been feeling increasingly weak and has had her legs buckle and give out and she has had multiple falls recently, most recently yesterday.  She states that she has had some numbness in her perineal region over the past few days, and now cannot tell if she has completed her bowel movement unless she  hears the stool hit the toilet.  She has had difficulty urinating and feels that she cannot completely empty her bladder.  She does have intact reflexes in her lower extremities on exam has slight weakness bilaterally and decreased sensation.  Given surgical history and multiple red flag symptoms will need MRI to rule out cauda equina syndrome or acute cord compression.  Basic labs ordered as well as pain control.  Case discussed with Dr. Vallery Ridge as well as Dr. Ashok Cordia at [facility] who accepted patient for ED to ED transfer, discussed plan with patient.  She will be transferred to [facility] emergency department for MRI, and neurosurgery can be consulted if there are any acute findings.  Patient expresses understanding and agreement with this plan.  Final Clinical Impression(s) / ED Diagnoses Final diagnoses:  Acute exacerbation of chronic low back pain  Saddle anesthesia  Fall, initial encounter  Difficulty urinating    Rx / DC Orders ED Discharge Orders     None        Janet Berlin 01/16/21 1714    Truddie Hidden, MD 01/17/21 [phone]

## 2021-01-16 NOTE — ED Triage Notes (Signed)
Pt from [facility] via [facility] for MRI.  VSS, received dilaudid prior to transport

## 2021-01-16 NOTE — ED Provider Notes (Signed)
[place] EMERGENCY DEPARTMENT Provider Note   CSN: [id number] Arrival date & time: 01/16/21  1152     History Chief Complaint  Patient presents with   Back Pain    Kathryn [name] is a 50 y.o. female who presents in transfer from [facility] emergency department for MRI of the lumbar spine.  Patient is a patient of Dr. Kathyrn Sheriff, status post L3-L4 complete laminectomy and radical facetectomy for decompression and interbody fusion in May of this year who presents with concern for worsening low back pain, further loss of sensation on her right lower extremity from her baseline, and now new saddle anesthesia.  Patient does endorse changes in her urinary habits since the time of her surgery but states that the anesthesia in her groin area is new as well as the changes in her sensation in her right lower extremity.  I personally reviewed this patient's medical records.   She has history of chronic low back pain, major depressive disorder, GERD, hypertension, COPD.   NO longer anticoagulated.   HPI     Past Medical History:  Diagnosis Date   Anxiety    Arthritis    Asthma    Bipolar disorder ([place])    Chronic back pain    COPD (chronic obstructive pulmonary disease) (HCC)    Chronic bronchitis   Depression    GERD (gastroesophageal reflux disease)    Hypertension    Neuropathy    Osteoarthritis of left knee, 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   Sleep apnea 04/2020   GETTING A cpap   Suicidal ideation 12/24/2018    Patient Active Problem List   Diagnosis Date Noted   Bilateral leg edema 10/18/2020   Morbid obesity ([place]) 10/18/2020   S/P lumbar fusion 09/14/2020   Acute deep vein thrombosis (DVT) of proximal vein of lower extremity ([place]) 09/14/2020   Spondylolisthesis at L4-L5 level 03/29/2020   Insomnia 03/19/2020   Mild persistent asthma without complication 97/41/6384   Need for immunization against influenza 03/19/2020   Body mass index (BMI) 38.0-38.9, adult  03/19/2020   GERD (gastroesophageal reflux disease) 03/19/2020   Intermittent palpitations 12/17/2019   Snoring 12/17/2019   Excessive daytime sleepiness 12/17/2019   Nightmare disorder, 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   Enlarged uterus 02/20/2018   Screening for colorectal cancer 02/20/2018   Osteoarthritis of left knee, 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   MDD (major depressive disorder), recurrent episode, severe ([place]) 12/24/2017   Low back pain, unspecified 05/11/2017   Low vitamin B12 level 05/11/2017   Primary hypertension 01/11/2017    Past Surgical History:  Procedure Laterality Date   BACK SURGERY     DILATATION AND CURETTAGE/HYSTEROSCOPY WITH MINERVA N/A 06/09/2020   Procedure: DILATATION AND CURETTAGE/HYSTEROSCOPY WITH MINERVA;  Surgeon: Florian Buff, MD;  Location: [facility] ORS;  Service: Gynecology;  Laterality: N/A;   ESOPHAGOGASTRODUODENOSCOPY (EGD) WITH PROPOFOL N/A 12/25/2018   Procedure: ESOPHAGOGASTRODUODENOSCOPY (EGD) WITH PROPOFOL;  Surgeon: Rogene Houston, MD;  Location: [facility] ENDO SUITE;  Service: Endoscopy;  Laterality: N/A;   PATELLA-FEMORAL ARTHROPLASTY Left 10/08/2018   Procedure: PATELLA-FEMORAL ARTHROPLASTY;  Surgeon: Marchia Bond, MD;  Location: [facility] ORS;  Service: Orthopedics;  Laterality: Left;   TUBAL LIGATION       OB History     Gravida  5   Para  3   Term  2   Preterm  1   AB  2   Living  3      SAB  1   IAB  1  Ectopic      Multiple      Live Births              Family History  Problem Relation Age of Onset   Gout Paternal Grandfather    Cirrhosis Paternal Grandfather    Hypertension Paternal Grandmother    Aneurysm Paternal Grandmother    Cirrhosis Maternal Grandmother    Cirrhosis Maternal Grandfather    Cancer Father    Cirrhosis Father    Cirrhosis Mother    Breast cancer Sister    Hypertension Sister    Bronchitis Daughter    Bronchitis Daughter    Asthma Son    Bronchitis Son    Migraines Neg Hx     Social  History   Tobacco Use   Smoking status: Former    Types: Cigarettes    Quit date: 2016    Years since quitting: 6.7   Smokeless tobacco: Never   Tobacco comments:    wears nicotine patches  Vaping Use   Vaping Use: Never used  Substance Use Topics   Alcohol use: Not Currently   Drug use: Not Currently    Types: Cocaine    Comment: crack  last used 2016    Home Medications Prior to Admission medications   Medication Sig Start Date End Date Taking? Authorizing Provider  albuterol (PROVENTIL HFA;VENTOLIN HFA) 108 (90 Base) MCG/ACT inhaler Inhale 2 puffs into the lungs every 6 (six) hours as needed for wheezing or shortness of breath.    [provider]  albuterol (PROVENTIL) (2.5 MG/3ML) 0.083% nebulizer solution Take 2.5 mg by nebulization every 6 (six) hours as needed for wheezing or shortness of breath.    [provider]  amLODipine (NORVASC) 5 MG tablet TAKE 1 TABLET BY MOUTH ONCE A DAY FOR BLOOD PRESSURE. Patient taking differently: Take 5 mg by mouth daily. 10/26/20   Lindell Spar, MD  apixaban (ELIQUIS) 5 MG TABS tablet Take 1 tablet (5 mg total) by mouth 2 (two) times daily. Patient not taking: Reported on 01/12/2021 09/07/20   Traci Sermon, PA-C  budesonide-formoterol (SYMBICORT) 80-4.5 MCG/ACT inhaler Inhale 2 puffs into the lungs 2 (two) times daily. 03/19/20   Lindell Spar, MD  celecoxib (CELEBREX) 200 MG capsule Take 1 capsule (200 mg total) by mouth daily. 11/23/20   Lindell Spar, MD  citalopram (CELEXA) 20 MG tablet TAKE ONE TABLET BY MOUTH ONCE DAILY. 12/27/20   Lindell Spar, MD  cloNIDine (CATAPRES) 0.1 MG tablet TAKE 1 TABLET BY MOUTH ONCE DAILY FOR BLOOD PRESSURE AND ANXIETY. 01/13/21   Lindell Spar, MD  Cyanocobalamin (VITAMIN B 12 PO) Take 1 tablet by mouth daily.    [provider]  DULoxetine (CYMBALTA) 30 MG capsule Take 30 mg by mouth in the morning and at bedtime. 11/26/20   [provider]  furosemide (LASIX)  20 MG tablet TAKE ONE TABLET BY MOUTH ONCE DAILY. Patient taking differently: Take 20 mg by mouth daily. 12/13/20   Lindell Spar, MD  hydrocortisone (ANUSOL-HC) 2.5 % rectal cream Place 1 application rectally 2 (two) times daily. Patient not taking: No sig reported 11/18/20   Lindell Spar, MD  hydrOXYzine (VISTARIL) 50 MG capsule Take 1 capsule (50 mg total) by mouth daily as needed for anxiety (Insomnia). 11/18/20   Lindell Spar, MD  methylPREDNISolone (MEDROL DOSEPAK) 4 MG TBPK tablet Day 1: 8 mg PO before breakfast, 4 mg after lunch and after dinner, and 8  mg at bedtime  Day 2: 4 mg PO before breakfast, after lunch, and after dinner and 8 mg at bedtime  Day 3: 4 mg PO before breakfast, after lunch, after dinner, and at bedtime  Day 4: 4 mg PO before breakfast, after lunch, and at bedtime  Day 5: 4 mg PO before breakfast and at bedtime  Day 6: 4 mg PO before breakfast Patient not taking: No sig reported 12/17/20   Varney Biles, MD  omeprazole (PRILOSEC) 20 MG capsule TAKE 1 CAPSULE BY MOUTH ONCE A DAY. 12/13/20   Lindell Spar, MD  oxyCODONE-acetaminophen (PERCOCET/ROXICET) 5-325 MG tablet Take 1 tablet by mouth every 6 (six) hours as needed for severe pain. Patient not taking: No sig reported 12/16/20   Varney Biles, MD  potassium chloride SA (KLOR-CON) 20 MEQ tablet Take 1 tablet (20 mEq total) by mouth daily. 10/12/20   Fayrene Helper, MD  predniSONE (STERAPRED UNI-PAK 21 TAB) 10 MG (21) TBPK tablet Take by mouth daily. Take 6 tabs by mouth daily  for 1  day, then 5 tabs for 1 day, then 4 tabs for 1 day, then 3 tabs for 1 day , 2 tabs for 1 days, then 1 tab by mouth daily for 1 day Patient not taking: Reported on 01/12/2021 12/22/20   Marcello Fennel, PA-C  pregabalin (LYRICA) 150 MG capsule Take 150 mg by mouth 2 (two) times daily. 11/26/20   [provider]  pregabalin (LYRICA) 75 MG capsule Take 1 capsule (75 mg total) by mouth 2 (two) times daily. Patient not  taking: No sig reported 08/16/20   Lindell Spar, MD  Relugolix-Estradiol-Norethind (MYFEMBREE) 40-1-0.5 MG TABS Take 1 tablet by mouth daily. Patient not taking: No sig reported 08/06/20   Florian Buff, MD  tiZANidine (ZANAFLEX) 2 MG tablet TAKE 1 TABLET BY MOUTH TWICE DAILY AS NEEDED FOR MUSCLE SPASMS Patient not taking: Reported on 01/12/2021 01/07/21   Lindell Spar, MD    Allergies    Clonopin [clonazepam], Fish allergy, Flexeril [cyclobenzaprine hcl], Ibuprofen, Shellfish allergy, Tylenol [acetaminophen], Ace inhibitors, Cyclobenzaprine, Tramadol, and Trazodone and nefazodone  Review of Systems   Review of Systems  Constitutional: Negative.   HENT: Negative.    Respiratory: Negative.    Cardiovascular: Negative.   Gastrointestinal: Negative.   Genitourinary:  Positive for decreased urine volume.       Urinary Incontinence  Musculoskeletal:  Positive for back pain and gait problem.  Skin: Negative.   Neurological:  Positive for weakness and numbness. Negative for dizziness, facial asymmetry, light-headedness and headaches.       Saddle anesthesia.    Physical Exam Updated Vital Signs BP (!) 152/87   Pulse 82   Temp 98.1 F (36.7 C) (Oral)   Resp 18   Ht 4\' 11"  (1.499 m)   LMP 06/07/2020   SpO2 97%   BMI 31.51 kg/m   Physical Exam Vitals and nursing note reviewed.  Constitutional:      Appearance: She is obese. She is not ill-appearing or toxic-appearing.  HENT:     Head: Normocephalic and atraumatic.     Nose: Nose normal. No congestion.     Mouth/Throat:     Mouth: Mucous membranes are moist.     Pharynx: No oropharyngeal exudate or posterior oropharyngeal erythema.  Eyes:     General:        Right eye: No discharge.        Left eye: No discharge.  Extraocular Movements: Extraocular movements intact.     Conjunctiva/sclera: Conjunctivae normal.     Pupils: Pupils are equal, round, and reactive to light.  Cardiovascular:     Rate and Rhythm: Normal  rate and regular rhythm.     Pulses: Normal pulses.     Heart sounds: Normal heart sounds. No murmur heard. Pulmonary:     Effort: Pulmonary effort is normal. No respiratory distress.     Breath sounds: Normal breath sounds. No wheezing or rales.  Abdominal:     General: Bowel sounds are normal. There is no distension.     Palpations: Abdomen is soft.     Tenderness: There is no abdominal tenderness. There is no right CVA tenderness, left CVA tenderness, guarding or rebound.  Musculoskeletal:        General: No deformity.     Cervical back: Neck supple. No bony tenderness.     Thoracic back: No bony tenderness.     Lumbar back: Spasms, tenderness and bony tenderness present. Negative right straight leg raise test and negative left straight leg raise test.  Skin:    General: Skin is warm and dry.     Capillary Refill: Capillary refill takes less than 2 seconds.  Neurological:     General: No focal deficit present.     Mental Status: She is alert and oriented to person, place, and time. Mental status is at baseline.     GCS: GCS eye subscore is 4. GCS verbal subscore is 5. GCS motor subscore is 6.     Sensory: Sensory deficit present.     Comments: Decreased sensation in the R foot, however patient able to sense painful stimuli. Able to flex both legs at the hip, symmetric plantar strength.   Psychiatric:        Mood and Affect: Mood normal.    ED Results / Procedures / Treatments   Labs (all labs ordered are listed, but only abnormal results are displayed) Labs Reviewed  CBC WITH DIFFERENTIAL/PLATELET - Abnormal; Notable for the following components:      Result Value   WBC 11.7 (*)    Platelets 674 (*)    Neutro Abs 9.4 (*)    All other components within normal limits  BASIC METABOLIC PANEL    EKG None  Radiology No results found.  Procedures Procedures   Medications Ordered in ED Medications  HYDROmorphone (DILAUDID) injection 1 mg (1 mg Intravenous Given 01/16/21  1633)  methocarbamol (ROBAXIN) tablet 500 mg (500 mg Oral Given 01/16/21 1633)  dexamethasone (DECADRON) injection 10 mg (10 mg Intravenous Given 01/16/21 1633)    ED Course  I have reviewed the triage vital signs and the nursing notes.  Pertinent labs & imaging results that were available during my care of the patient were reviewed by me and considered in my medical decision making (see chart for details).  Clinical Course as of 01/17/21 1207  Nancy Fetter Jan 16, 2021  2307 Patient did not tolerate MRI; image not obtained. Reordered. Benadryl ordered for anxiety. [RS]    Clinical Course User Index [RS] Venita Seng, Gypsy Balsam, PA-C   MDM Rules/Calculators/A&P                         50 year old female with history of spinal surgery, chronic back pain, and chronic decreased sensation in her lower extremities worse on the right, who presents in transfer from [facility] emergency department for MRI of the lumbar spine.  Vital  signs are normal on intake.  Cardiopulmonary exam is normal, abdominal exam is benign.  Patient is tender in the midline lumbar spine.  Decreased sensation in the right lower extremity relative to the left but patient able to sense painful stimuli.  Symmetric strength in lower extremities bilaterally.  CBC and BMP obtained at prior facility; CBC with mild leukocytosis of 11.7, otherwise unremarkable, BMP unremarkable.  Patient was transported to MRI, however became claustrophobic and agitated and was unable to complete the image.  Patient rescheduled for later this evening.  She did receive hydroxyzine prior to initial MRI attempts, however will dose with Benadryl prior to the next scan attempt.  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  Patient reevaluated after analgesia with significant improvement in her pain.  She is resting comfortably, eating a sandwich.  Care of this patient signed out to oncoming ED provider M.Belaya, PA-C at time  of shift change.  Patient is pending MRI at this time.  All pertinent HPI, physical exam, and laboratory findings were discussed with her prior to my departure.  Maite voiced understanding of her medical evaluation and treatment plan thus far.  Each of her questions answered to her expressed satisfaction.  This chart was dictated using voice recognition software, Dragon. Despite the best efforts of this provider to proofread and correct errors, errors may still occur which can change documentation meaning.   Final Clinical Impression(s) / ED Diagnoses Final diagnoses:  Acute exacerbation of chronic low back pain  Saddle anesthesia  Fall, initial encounter  Difficulty urinating    Rx / DC Orders ED Discharge Orders     None        Emeline Darling, PA-C 01/17/21 1210    Lajean Saver, MD 01/17/21 1422

## 2021-01-16 NOTE — ED Triage Notes (Signed)
Pt reports left sided sacral back pain that radiates behind her left leg. Pt states the pain started yesterday.

## 2021-01-16 NOTE — ED Notes (Signed)
Patient transported to MRI 

## 2021-01-17 ENCOUNTER — Telehealth: Payer: Self-pay

## 2021-01-17 ENCOUNTER — Encounter: Payer: Self-pay | Admitting: Internal Medicine

## 2021-01-17 ENCOUNTER — Other Ambulatory Visit: Payer: Self-pay | Admitting: *Deleted

## 2021-01-17 ENCOUNTER — Emergency Department (HOSPITAL_COMMUNITY): Payer: 59

## 2021-01-17 ENCOUNTER — Ambulatory Visit (INDEPENDENT_AMBULATORY_CARE_PROVIDER_SITE_OTHER): Payer: 59 | Admitting: Internal Medicine

## 2021-01-17 VITALS — BP 148/80 | HR 99 | Resp 18 | Ht 59.5 in | Wt 189.1 lb

## 2021-01-17 DIAGNOSIS — N83202 Unspecified ovarian cyst, left side: Secondary | ICD-10-CM | POA: Diagnosis not present

## 2021-01-17 DIAGNOSIS — Z09 Encounter for follow-up examination after completed treatment for conditions other than malignant neoplasm: Secondary | ICD-10-CM

## 2021-01-17 DIAGNOSIS — M170 Bilateral primary osteoarthritis of knee: Secondary | ICD-10-CM | POA: Diagnosis not present

## 2021-01-17 DIAGNOSIS — E882 Lipomatosis, not elsewhere classified: Secondary | ICD-10-CM | POA: Diagnosis not present

## 2021-01-17 DIAGNOSIS — I10 Essential (primary) hypertension: Secondary | ICD-10-CM | POA: Diagnosis not present

## 2021-01-17 DIAGNOSIS — N83201 Unspecified ovarian cyst, right side: Secondary | ICD-10-CM | POA: Diagnosis not present

## 2021-01-17 DIAGNOSIS — Z23 Encounter for immunization: Secondary | ICD-10-CM | POA: Diagnosis not present

## 2021-01-17 DIAGNOSIS — M48062 Spinal stenosis, lumbar region with neurogenic claudication: Secondary | ICD-10-CM | POA: Diagnosis not present

## 2021-01-17 DIAGNOSIS — Z01818 Encounter for other preprocedural examination: Secondary | ICD-10-CM | POA: Diagnosis not present

## 2021-01-17 DIAGNOSIS — M545 Low back pain, unspecified: Secondary | ICD-10-CM

## 2021-01-17 DIAGNOSIS — M5126 Other intervertebral disc displacement, lumbar region: Secondary | ICD-10-CM | POA: Diagnosis not present

## 2021-01-17 MED ORDER — HYDROMORPHONE HCL 1 MG/ML IJ SOLN
1.0000 mg | Freq: Once | INTRAMUSCULAR | Status: AC
  Administered 2021-01-17: 1 mg via INTRAVENOUS
  Filled 2021-01-17: qty 1

## 2021-01-17 MED ORDER — HYDROMORPHONE HCL 1 MG/ML IJ SOLN: 1.0000 mg | Freq: Once | INTRAMUSCULAR | Status: DC

## 2021-01-17 MED ORDER — PREDNISONE 10 MG (21) PO TBPK: ORAL_TABLET | Freq: Every day | ORAL | 0 refills | Status: DC

## 2021-01-17 MED ORDER — OXYCODONE HCL 5 MG PO TABS
5.0000 mg | ORAL_TABLET | Freq: Once | ORAL | Status: AC
  Administered 2021-01-17: 5 mg via ORAL
  Filled 2021-01-17: qty 1

## 2021-01-17 MED ORDER — PREGABALIN 75 MG PO CAPS: 75.0000 mg | ORAL_CAPSULE | Freq: Two times a day (BID) | ORAL | 5 refills | Status: DC

## 2021-01-17 MED ORDER — DIPHENHYDRAMINE HCL 50 MG/ML IJ SOLN
50.0000 mg | Freq: Once | INTRAMUSCULAR | Status: AC
  Administered 2021-01-17: 50 mg via INTRAVENOUS
  Filled 2021-01-17: qty 1

## 2021-01-17 MED ORDER — OXYCODONE HCL 5 MG PO TABS: 5.0000 mg | ORAL_TABLET | ORAL | 0 refills | Status: DC | PRN

## 2021-01-17 MED ORDER — OXYCODONE HCL 5 MG PO TABS
10.0000 mg | ORAL_TABLET | Freq: Once | ORAL | Status: AC
  Administered 2021-01-17: 10 mg via ORAL
  Filled 2021-01-17: qty 2

## 2021-01-17 NOTE — Assessment & Plan Note (Signed)
S/p left arthroplasty C/o right knee pain, follows up with Orthopedic surgeon - planned to have TKA

## 2021-01-17 NOTE — Assessment & Plan Note (Signed)
ER chart reviewed, including imaging Prednisone for acute back pain F/u with Spine surgeon

## 2021-01-17 NOTE — Discharge Instructions (Addendum)
Your MRI today was overall reassuring.  Please take the prescribed pain medicines as needed for pain and call Dr. Cleotilde Neer office today.  Use the steroids as well.  Your MRI did show bilateral ovarian cysts which were new from your CT scan back in June 2022.  Please follow-up with your primary care doctor about this.  Return to the ER for any new or worsening symptoms.

## 2021-01-17 NOTE — Telephone Encounter (Signed)
Already signed off waiting to send with office visit notes once signed

## 2021-01-17 NOTE — Telephone Encounter (Signed)
Patient requesting 150 mg pregabalin (LYRICA) capsule   Pharmacy: [facility]

## 2021-01-17 NOTE — Telephone Encounter (Signed)
Refill on medication sent to pharmacy

## 2021-01-17 NOTE — Assessment & Plan Note (Signed)
BP Readings from Last 1 Encounters:  01/17/21 (!) 148/80   Elevated today, likely due to pain and has not taken her medication this morning Well-controlled overall Counseled for compliance with the medications Advised DASH diet and moderate exercise/walking, at least 150 mins/week

## 2021-01-17 NOTE — Telephone Encounter (Signed)
Transition Care Management Follow-up Telephone Call Date of discharge and from where: 01/12/2021 from [facility] How have you been since you were released from the hospital? Pt stated that she is feeling some better and was able to see PCP today.  Any questions or concerns? No  Items Reviewed: Did the pt receive and understand the discharge instructions provided? Yes  Medications obtained and verified? Yes  Other? No  Any new allergies since your discharge? No  Dietary orders reviewed? No Do you have support at home? Yes   Functional Questionnaire: (I = Independent and D = Dependent) ADLs: I  Bathing/Dressing- I  Meal Prep- I  Eating- I  Maintaining continence- I  Transferring/Ambulation- I  Managing Meds- I   Follow up appointments reviewed:  PCP Hospital f/u appt confirmed? Yes  Appointment with PCP today Will Hospital f/u appt confirmed? No   Are transportation arrangements needed? No  If their condition worsens, is the pt aware to call PCP or go to the Emergency Dept.? Yes Was the patient provided with contact information for the PCP's office or ED? Yes Was to pt encouraged to call back with questions or concerns? Yes

## 2021-01-17 NOTE — ED Provider Notes (Signed)
Care of the patient received from [facility].  Please see her note for full HPI.  In short, patient with chronic back pain, status post L3-4 laminectomy and facetectomy with Dr. Kathyrn Sheriff in May 2022 presents to the ER with complains of saddle anesthesia and worsening back pain.  Care of the patient received pending MRI of the back, patient did not tolerate MRI without sedation and thus needed Benadryl for imaging  I followed up on the MRI which on exam be found below IMPRESSION:  1. No definite acute finding.  2. L2-3 moderate thecal sac narrowing from facet spurring and  epidural lipomatosis, progressed from 2021.  3. L5-S1 thecal sac effacement from epidural fat with distortion  from spinal hardware.  4. Obscured L3-4 and L4-5 levels due to hardware artifact.  5. Bilateral ovarian cysts measuring up to 3 cm on the left. These  are new from an abdominal CT June 2022, compatible with functional  cysts. No follow-up imaging recommended. Note: This recommendation  does not apply to premenarchal patients and to those with increased  risk (genetic, family history, elevated tumor markers or other   No evidence of acute cauda equina or abscesses, no evidence of emergent pathology causing her pain.  I spoke with Dr. Radford Pax with neurosurgery, he recommends steroids, pain medication (patient has a listed anaphylactic reaction to Tylenol and ibuprofen, will prescribe Roxicodone) and follow-up with Dr. Kathyrn Sheriff.  Patient was informed of her MRI findings and encouraged to call Dr. Kathyrn Sheriff today.  She voices understanding and is agreeable.  Stable for discharge.   Results for orders placed or performed during the hospital encounter of 01/16/21  CBC with Differential  Result Value Ref Range   WBC 11.7 (H) 4.0 - 10.5 K/uL   RBC 4.38 3.87 - 5.11 MIL/uL   Hemoglobin 12.4 12.0 - 15.0 g/dL   HCT 37.7 36.0 - 46.0 %   MCV 86.1 80.0 - 100.0 fL   MCH 28.3 26.0 - 34.0 pg   MCHC 32.9 30.0 - 36.0 g/dL    RDW 15.4 11.5 - 15.5 %   Platelets 674 (H) 150 - 400 K/uL   nRBC 0.0 0.0 - 0.2 %   Neutrophils Relative % 81 %   Neutro Abs 9.4 (H) 1.7 - 7.7 K/uL   Lymphocytes Relative 14 %   Lymphs Abs 1.7 0.7 - 4.0 K/uL   Monocytes Relative 4 %   Monocytes Absolute 0.5 0.1 - 1.0 K/uL   Eosinophils Relative 0 %   Eosinophils Absolute 0.0 0.0 - 0.5 K/uL   Basophils Relative 0 %   Basophils Absolute 0.0 0.0 - 0.1 K/uL   Immature Granulocytes 1 %   Abs Immature Granulocytes 0.07 0.00 - 0.07 K/uL  Basic metabolic panel  Result Value Ref Range   Sodium 138 135 - 145 mmol/L   Potassium 4.1 3.5 - 5.1 mmol/L   Chloride 104 98 - 111 mmol/L   CO2 27 22 - 32 mmol/L   Glucose, Bld 99 70 - 99 mg/dL   BUN 18 6 - 20 mg/dL   Creatinine, Ser 0.77 0.44 - 1.00 mg/dL   Calcium 9.2 8.9 - 10.3 mg/dL   GFR, Estimated >60 >60 mL/min   Anion gap 7 5 - 15   MR LUMBAR SPINE WO CONTRAST  Result Date: 01/17/2021 CLINICAL DATA:  Low back pain with cauda equina syndrome suspected EXAM: MRI LUMBAR SPINE WITHOUT CONTRAST TECHNIQUE: Multiplanar, multisequence MR imaging of the lumbar spine was performed. No intravenous contrast was  administered. COMPARISON:  02/24/2020 lumbar MRI.  Abdominal CT 10/07/2020 FINDINGS: Segmentation:  5 lumbar type vertebrae Alignment:  Physiologic. Vertebrae:  L3-L5 Conus medullaris and cauda equina: Conus extends to the L1 level. Conus and cauda equina appear normal. Paraspinal and other soft tissues: Expected postoperative scarring in the posterior lumbar soft tissues. Posterior intramural fibroid measuring 3 cm. Bilateral ovarian cystic intensities, larger on the left and measuring up to 3 cm. Disc levels: T12- L1: Unremarkable. L1-L2: Mild narrowing of the thecal sac from epidural fat L2-L3: Moderate effacement of the thecal sac primarily due to epidural fat and accentuated by posterior element hypertrophy. Mild disc bulging. L3-L4: PLIF. L4-L5: PLIF. L5-S1:Narrower appearance of the thecal sac  than on prior, primarily from epidural fat. The left S1 nerve root appears somewhat posterior displaced but no discrete herniation seen on sagittal images. There is contributing architectural distortion from metallic artifact, especially seen at this level on sagittal images. IMPRESSION: 1. No definite acute finding. 2. L2-3 moderate thecal sac narrowing from facet spurring and epidural lipomatosis, progressed from 2021. 3. L5-S1 thecal sac effacement from epidural fat with distortion from spinal hardware. 4. Obscured L3-4 and L4-5 levels due to hardware artifact. 5. Bilateral ovarian cysts measuring up to 3 cm on the left. These are new from an abdominal CT June 2022, compatible with functional cysts. No follow-up imaging recommended. Note: This recommendation does not apply to premenarchal patients and to those with increased risk (genetic, family history, elevated tumor markers or other high-risk factors) of ovarian cancer. Reference: JACR 2020 Feb; 17(2):248-254 Electronically Signed   By: Jorje Guild M.D.   On: 01/17/2021 06:09   US Venous Img Lower Right (DVT Study)  Result Date: 01/12/2021 CLINICAL DATA:  Right calf pain, edema EXAM: RIGHT LOWER EXTREMITY VENOUS DOPPLER ULTRASOUND TECHNIQUE: Gray-scale sonography with graded compression, as well as color Doppler and duplex ultrasound were performed to evaluate the lower extremity deep venous systems from the level of the common femoral vein and including the common femoral, femoral, profunda femoral, popliteal and calf veins including the posterior tibial, peroneal and gastrocnemius veins when visible. The superficial great saphenous vein was also interrogated. Spectral Doppler was utilized to evaluate flow at rest and with distal augmentation maneuvers in the common femoral, femoral and popliteal veins. COMPARISON:  None. FINDINGS: Contralateral Common Femoral Vein: Respiratory phasicity is normal and symmetric with the symptomatic side. No evidence  of thrombus. Normal compressibility. Common Femoral Vein: No evidence of thrombus. Normal compressibility, respiratory phasicity and response to augmentation. Saphenofemoral Junction: No evidence of thrombus. Normal compressibility and flow on color Doppler imaging. Profunda Femoral Vein: No evidence of thrombus. Normal compressibility and flow on color Doppler imaging. Femoral Vein: No evidence of thrombus. Normal compressibility, respiratory phasicity and response to augmentation. Popliteal Vein: No evidence of thrombus. Normal compressibility, respiratory phasicity and response to augmentation. Calf Veins: No evidence of thrombus. Normal compressibility and flow on color Doppler imaging. Other Findings:  None. IMPRESSION: No evidence of deep venous thrombosis. Electronically Signed   By: Albin Felling M.D.   On: 01/12/2021 15:49      Garald Balding, PA-C 01/17/21 0645    Palumbo, April, MD 01/17/21 [phone]

## 2021-01-17 NOTE — ED Notes (Signed)
Patient transported to MRI 

## 2021-01-17 NOTE — Assessment & Plan Note (Signed)
BP elevated today due to pain and has not been able to take medication this morning, but usually WNL EKG: Sinus rhythm. Pathological q wave in lead III, noncontiguous. No signs of active ischemia.  Medically optimized with moderate risk for TKA.

## 2021-01-17 NOTE — Assessment & Plan Note (Signed)
H/o chronic low back pain S/p lumbar fusion surgery - L3-L4 F/u with Spine surgery Recent ER visit for acute worsening of back pain - MRI from ER reviewed, continue Prednisone Followed by pain management

## 2021-01-17 NOTE — Telephone Encounter (Signed)
Patient called said let Dr Posey Pronto know he can sign off on the knee surgery papers per Dr Dwyane Dee.  Please contact patient [phone].

## 2021-01-17 NOTE — Patient Instructions (Signed)
Please start taking Prednisone as prescribed. Please make sure to take Omeprazole while taking Prednisone.  Please contact your Spine surgeon for follow up after recent ER visit.

## 2021-01-17 NOTE — Progress Notes (Signed)
Established Patient Office Visit  Subjective:  Patient ID: Kathryn [name], female    DOB: 10/25/1970  Age: 50 y.o. MRN: [id number]  CC:  Chief Complaint  Patient presents with   Follow-up    Pt needs surgery clearance forms filled out for right knee surgery     HPI Kathryn [name] presents for preop evaluation for her right TKA. She has been having right knee pain due to OA. She is followed by Orthopedic surgeon.  She went to ER at [facility] yesterday for acute worsening of back pain.  She reported numbness in her perineal area and numbness and weakness of b/l LE.  She was transferred to [facility] ER for MRI and neurosurgery evaluation.  She was given dexamethasone and pain medications.  MRI did not show any signs of cauda equina or any other acute pathology to suggest new symptoms.  She came to office this morning directly from ER.  Her back pain is improved compared to yesterday.  She denies any numbness in the perineal area currently.  Denies any urinary or stool incontinence currently.  Her blood pressure was elevated in the office today.  Of note, she could not sleep in the ER yesterday and has not taken her BP meds this morning.  She denies any headache, dizziness, chest pain, dyspnea or palpitations.  Past Medical History:  Diagnosis Date   Anxiety    Arthritis    Asthma    Bipolar disorder ([place])    Chronic back pain    COPD (chronic obstructive pulmonary disease) (HCC)    Chronic bronchitis   Depression    GERD (gastroesophageal reflux disease)    Hypertension    Neuropathy    Osteoarthritis of left knee, 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   Sleep apnea 04/2020   GETTING A cpap   Suicidal ideation 12/24/2018    Past Surgical History:  Procedure Laterality Date   BACK SURGERY     DILATATION AND CURETTAGE/HYSTEROSCOPY WITH MINERVA N/A 06/09/2020   Procedure: DILATATION AND CURETTAGE/HYSTEROSCOPY WITH MINERVA;  Surgeon: Florian Buff, MD;  Location: [facility] ORS;   Service: Gynecology;  Laterality: N/A;   ESOPHAGOGASTRODUODENOSCOPY (EGD) WITH PROPOFOL N/A 12/25/2018   Procedure: ESOPHAGOGASTRODUODENOSCOPY (EGD) WITH PROPOFOL;  Surgeon: Rogene Houston, MD;  Location: [facility] END[facility] SUITE;  Service: Endoscopy;  Laterality: N/A;   PATELLA-FEMORAL ARTHROPLASTY Left 10/08/2018   Procedure: PATELLA-FEMORAL ARTHROPLASTY;  Surgeon: Marchia Bond, MD;  Location: [facility] ORS;  Service: Orthopedics;  Laterality: Left;   TUBAL LIGATION      Family History  Problem Relation Age of Onset   Gout Paternal Grandfather    Cirrhosis Paternal Grandfather    Hypertension Paternal Grandmother    Aneurysm Paternal Grandmother    Cirrhosis Maternal Grandmother    Cirrhosis Maternal Grandfather    Cancer Father    Cirrhosis Father    Cirrhosis Mother    Breast cancer Sister    Hypertension Sister    Bronchitis Daughter    Bronchitis Daughter    Asthma Son    Bronchitis Son    Migraines Neg Hx     Social History   Socioeconomic History   Marital status: Single    Spouse name: Not on file   Number of children: Not on file   Years of education: Not on file   Highest education level: Not on file  Occupational History   Not on file  Tobacco Use   Smoking status: Former    Types: Cigarettes  Quit date: 2016    Years since quitting: 6.7   Smokeless tobacco: Never   Tobacco comments:    wears nicotine patches  Vaping Use   Vaping Use: Never used  Substance and Sexual Activity   Alcohol use: Not Currently   Drug use: Not Currently    Types: Cocaine    Comment: crack  last used 2016   Sexual activity: Yes    Birth control/protection: Surgical    Comment: tubal  Other Topics Concern   Not on file  Social History Narrative   R handed    Lives with boyfriend   1 Cup of caffeine daily    Social Determinants of Health   Financial Resource Strain: Not on file  Food Insecurity: Not on file  Transportation Needs: Not on file  Physical Activity: Not on file   Stress: Not on file  Social Connections: Not on file  Intimate Partner Violence: Not on file    Outpatient Medications Prior to Visit  Medication Sig Dispense Refill   albuterol (PROVENTIL HFA;VENTOLIN HFA) 108 (90 Base) MCG/ACT inhaler Inhale 2 puffs into the lungs every 6 (six) hours as needed for wheezing or shortness of breath.     albuterol (PROVENTIL) (2.5 MG/3ML) 0.083% nebulizer solution Take 2.5 mg by nebulization every 6 (six) hours as needed for wheezing or shortness of breath.     amLODipine (NORVASC) 5 MG tablet TAKE 1 TABLET BY MOUTH ONCE A DAY FOR BLOOD PRESSURE. (Patient taking differently: Take 5 mg by mouth daily.) 90 tablet 0   budesonide-formoterol (SYMBICORT) 80-4.5 MCG/ACT inhaler Inhale 2 puffs into the lungs 2 (two) times daily. 1 each 5   celecoxib (CELEBREX) 200 MG capsule Take 1 capsule (200 mg total) by mouth daily. 30 capsule 0   citalopram (CELEXA) 20 MG tablet TAKE ONE TABLET BY MOUTH ONCE DAILY. 30 tablet 0   cloNIDine (CATAPRES) 0.1 MG tablet TAKE 1 TABLET BY MOUTH ONCE DAILY FOR BLOOD PRESSURE AND ANXIETY. 90 tablet 0   Cyanocobalamin (VITAMIN B 12 PO) Take 1 tablet by mouth daily.     DULoxetine (CYMBALTA) 30 MG capsule Take 30 mg by mouth in the morning and at bedtime.     furosemide (LASIX) 20 MG tablet TAKE ONE TABLET BY MOUTH ONCE DAILY. (Patient taking differently: Take 20 mg by mouth daily.) 90 tablet 0   hydrocortisone (ANUSOL-HC) 2.5 % rectal cream Place 1 application rectally 2 (two) times daily. 30 g 0   hydrOXYzine (VISTARIL) 50 MG capsule Take 1 capsule (50 mg total) by mouth daily as needed for anxiety (Insomnia). 30 capsule 5   methylPREDNISolone (MEDROL DOSEPAK) 4 MG TBPK tablet Day 1: 8 mg PO before breakfast, 4 mg after lunch and after dinner, and 8 mg at bedtime  Day 2: 4 mg PO before breakfast, after lunch, and after dinner and 8 mg at bedtime  Day 3: 4 mg PO before breakfast, after lunch, after dinner, and at bedtime  Day 4: 4 mg PO  before breakfast, after lunch, and at bedtime  Day 5: 4 mg PO before breakfast and at bedtime  Day 6: 4 mg PO before breakfast 21 tablet 0   omeprazole (PRILOSEC) 20 MG capsule TAKE 1 CAPSULE BY MOUTH ONCE A DAY. 90 capsule 0   oxyCODONE (ROXICODONE) 5 MG immediate release tablet Take 1 tablet (5 mg total) by mouth every 4 (four) hours as needed for up to 3 days for severe pain. 18 tablet 0   oxyCODONE-acetaminophen (PERCOCET/ROXICET)  5-325 MG tablet Take 1 tablet by mouth every 6 (six) hours as needed for severe pain. 6 tablet 0   potassium chloride SA (KLOR-CON) 20 MEQ tablet Take 1 tablet (20 mEq total) by mouth daily. 30 tablet 0   predniSONE (STERAPRED UNI-PAK 21 TAB) 10 MG (21) TBPK tablet Take by mouth daily. Take 6 tabs by mouth daily  for 2 days, then 5 tabs for 2 days, then 4 tabs for 2 days, then 3 tabs for 2 days, 2 tabs for 2 days, then 1 tab by mouth daily for 2 days 42 tablet 0   pregabalin (LYRICA) 150 MG capsule Take 150 mg by mouth 2 (two) times daily.     pregabalin (LYRICA) 75 MG capsule Take 1 capsule (75 mg total) by mouth 2 (two) times daily. 60 capsule 5   Relugolix-Estradiol-Norethind (MYFEMBREE) 40-1-0.5 MG TABS Take 1 tablet by mouth daily. 30 tablet 11   tiZANidine (ZANAFLEX) 2 MG tablet TAKE 1 TABLET BY MOUTH TWICE DAILY AS NEEDED FOR MUSCLE SPASMS 30 tablet 0   apixaban (ELIQUIS) 5 MG TABS tablet Take 1 tablet (5 mg total) by mouth 2 (two) times daily. 60 tablet    No facility-administered medications prior to visit.    Allergies  Allergen Reactions   Clonopin [Clonazepam] Anaphylaxis    Swelling of the tongue and mouth.    Fish Allergy Anaphylaxis, Shortness Of Breath and Swelling   Flexeril [Cyclobenzaprine Hcl] Shortness Of Breath   Ibuprofen Anaphylaxis, Hives and Other (See Comments)   Shellfish Allergy Anaphylaxis   Tylenol [Acetaminophen] Anaphylaxis   Ace Inhibitors Cough   Cyclobenzaprine    Tramadol Nausea And Vomiting    Upset stomach    Trazodone And Nefazodone Hives    ROS Review of Systems  Constitutional:  Negative for chills and fever.  HENT:  Negative for congestion, sinus pressure, sinus pain and sore throat.   Eyes:  Negative for pain and discharge.  Respiratory:  Negative for cough and shortness of breath.   Cardiovascular:  Negative for chest pain and palpitations.  Gastrointestinal:  Negative for abdominal pain, constipation, diarrhea, nausea and vomiting.  Endocrine: Negative for polydipsia and polyuria.  Genitourinary:  Positive for pelvic pain. Negative for dysuria, hematuria, menstrual problem, vaginal bleeding and vaginal discharge.  Musculoskeletal:  Positive for arthralgias and back pain. Negative for neck pain and neck stiffness.  Skin:  Negative for rash.  Neurological:  Negative for dizziness and weakness.  Psychiatric/Behavioral:  Positive for dysphoric mood and sleep disturbance. Negative for agitation and behavioral problems.      Objective:    Physical Exam Vitals reviewed.  Constitutional:      General: She is not in acute distress.    Appearance: She is obese. She is not diaphoretic.  HENT:     Head: Normocephalic and atraumatic.     Nose: Nose normal.     Mouth/Throat:     Mouth: Mucous membranes are moist.  Eyes:     General: No scleral icterus.    Extraocular Movements: Extraocular movements intact.  Cardiovascular:     Rate and Rhythm: Normal rate and regular rhythm.     Pulses: Normal pulses.     Heart sounds: Normal heart sounds. No murmur heard. Pulmonary:     Breath sounds: Normal breath sounds. No wheezing or rales.  Abdominal:     Palpations: Abdomen is soft.     Tenderness: There is no abdominal tenderness. There is no guarding or rebound.  Musculoskeletal:  General: Tenderness (Lumbar spine area) present.     Cervical back: Neck supple. No tenderness.     Right lower leg: No edema.     Left lower leg: No edema.  Skin:    General: Skin is warm.      Findings: No rash.  Neurological:     General: No focal deficit present.     Mental Status: She is alert and oriented to person, place, and time.     Sensory: No sensory deficit.     Motor: Weakness (B/l LE (chronic)) present.  Psychiatric:        Mood and Affect: Mood normal.        Behavior: Behavior normal.    BP (!) 148/80 (BP Location: Left Arm, Patient Position: Sitting, Cuff Size: Normal)   Pulse 99   Resp 18   Ht 4' 11.5" (1.511 m)   Wt 189 lb 1.3 oz (85.8 kg)   LMP 06/07/2020   SpO2 96%   BMI 37.55 kg/m  Wt Readings from Last 3 Encounters:  01/17/21 189 lb 1.3 oz (85.8 kg)  01/12/21 156 lb (70.8 kg)  12/22/20 156 lb (70.8 kg)     Health Maintenance Due  Topic Date Due   COLONOSCOPY (Pts 45-42yrs Insurance coverage will need to be confirmed)  Never done   COVID-19 Vaccine (3 - Moderna risk series) 11/25/2019   Zoster Vaccines- Shingrix (2 of 2) 01/13/2021    There are no preventive care reminders to display for this patient.  Lab Results  Component Value Date   TSH 1.533 12/24/2018   Lab Results  Component Value Date   WBC 11.7 (H) 01/16/2021   HGB 12.4 01/16/2021   HCT 37.7 01/16/2021   MCV 86.1 01/16/2021   PLT 674 (H) 01/16/2021   Lab Results  Component Value Date   NA 138 01/16/2021   K 4.1 01/16/2021   CO2 27 01/16/2021   GLUCOSE 99 01/16/2021   BUN 18 01/16/2021   CREATININE 0.77 01/16/2021   BILITOT <0.1 (L) 12/22/2020   ALKPHOS 85 12/22/2020   AST 31 12/22/2020   ALT 31 12/22/2020   PROT 7.3 12/22/2020   ALBUMIN 3.7 12/22/2020   CALCIUM 9.2 01/16/2021   ANIONGAP 7 01/16/2021   Lab Results  Component Value Date   CHOL 155 04/20/2017   Lab Results  Component Value Date   HDL 70 04/20/2017   Lab Results  Component Value Date   LDLCALC 72 04/20/2017   Lab Results  Component Value Date   TRIG 46 04/20/2017   Lab Results  Component Value Date   CHOLHDL 2.2 04/20/2017   Lab Results  Component Value Date   HGBA1C 5.4  04/20/2017      Assessment & Plan:   Problem List Items Addressed This Visit        Preoperative examination - Primary   BP elevated today due to pain and has not been able to take medication this morning, but usually WNL EKG: Sinus rhythm. Pathological q wave in lead III, noncontiguous. No signs of active ischemia.  Medically optimized with moderate risk for TKA.     Relevant Orders  EKG 12-Lead (Completed)   Encounter for examination following treatment at hospital   ER chart reviewed, including imaging Prednisone for acute back pain F/u with Spine surgeon       Cardiovascular and Mediastinum   Primary hypertension    BP Readings from Last 1 Encounters:  01/17/21 (!) 148/80  Elevated today, likely  due to pain and has not taken her medication this morning Well-controlled overall Counseled for compliance with the medications Advised DASH diet and moderate exercise/walking, at least 150 mins/week        Musculoskeletal and Integument   OA (osteoarthritis) of knee    S/p left arthroplasty C/o right knee pain, follows up with Orthopedic surgeon - planned to have TKA        Other   Lumbar spinal stenosis    H/o chronic low back pain S/p lumbar fusion surgery - L3-L4 F/u with Spine surgery Recent ER visit for acute worsening of back pain - MRI from ER reviewed, continue Prednisone Followed by pain management      Need for immunization against influenza   Relevant Orders   Flu Vaccine QUAD 37mo+IM (Fluarix, Fluzone & Alfiuria Quad PF) (Completed)    No orders of the defined types were placed in this encounter.   Follow-up: Return if symptoms worsen or fail to improve.    Lindell Spar, MD

## 2021-01-18 ENCOUNTER — Telehealth: Payer: Self-pay

## 2021-01-18 NOTE — Telephone Encounter (Signed)
Transition Care Management Unsuccessful Follow-up Telephone Call  Date of discharge and from where:  01/17/2021-[facility]   Attempts:  1st Attempt  Reason for unsuccessful TCM follow-up call:  Voice mail full

## 2021-01-19 NOTE — Telephone Encounter (Signed)
Transition Care Management Unsuccessful Follow-up Telephone Call  Date of discharge and from where:  01/17/2021 form [facility] [facility]  Attempts:  2nd Attempt  Reason for unsuccessful TCM follow-up call:  Voice mail full

## 2021-01-20 ENCOUNTER — Telehealth: Payer: Self-pay

## 2021-01-20 ENCOUNTER — Emergency Department (HOSPITAL_COMMUNITY)
Admission: EM | Admit: 2021-01-20 | Discharge: 2021-01-20 | Disposition: A | Discharge status: Home / Self Care | Payer: Medicaid Other | Attending: Emergency Medicine | Admitting: Emergency Medicine

## 2021-01-20 ENCOUNTER — Other Ambulatory Visit: Payer: Self-pay

## 2021-01-20 ENCOUNTER — Emergency Department (HOSPITAL_COMMUNITY): Payer: Medicaid Other

## 2021-01-20 ENCOUNTER — Encounter (HOSPITAL_COMMUNITY): Payer: Self-pay | Admitting: Emergency Medicine

## 2021-01-20 DIAGNOSIS — W100XXA Fall (on)(from) escalator, initial encounter: Secondary | ICD-10-CM | POA: Insufficient documentation

## 2021-01-20 DIAGNOSIS — Z87891 Personal history of nicotine dependence: Secondary | ICD-10-CM | POA: Diagnosis not present

## 2021-01-20 DIAGNOSIS — M25552 Pain in left hip: Secondary | ICD-10-CM | POA: Diagnosis not present

## 2021-01-20 DIAGNOSIS — I10 Essential (primary) hypertension: Secondary | ICD-10-CM | POA: Diagnosis not present

## 2021-01-20 DIAGNOSIS — J449 Chronic obstructive pulmonary disease, unspecified: Secondary | ICD-10-CM | POA: Diagnosis not present

## 2021-01-20 DIAGNOSIS — J4540 Moderate persistent asthma, uncomplicated: Secondary | ICD-10-CM | POA: Diagnosis not present

## 2021-01-20 DIAGNOSIS — R0781 Pleurodynia: Secondary | ICD-10-CM | POA: Diagnosis not present

## 2021-01-20 DIAGNOSIS — Z79899 Other long term (current) drug therapy: Secondary | ICD-10-CM | POA: Diagnosis not present

## 2021-01-20 DIAGNOSIS — Z7951 Long term (current) use of inhaled steroids: Secondary | ICD-10-CM | POA: Diagnosis not present

## 2021-01-20 DIAGNOSIS — M25561 Pain in right knee: Secondary | ICD-10-CM | POA: Diagnosis not present

## 2021-01-20 DIAGNOSIS — M25562 Pain in left knee: Secondary | ICD-10-CM | POA: Insufficient documentation

## 2021-01-20 DIAGNOSIS — R079 Chest pain, unspecified: Secondary | ICD-10-CM | POA: Insufficient documentation

## 2021-01-20 DIAGNOSIS — W19XXXA Unspecified fall, initial encounter: Secondary | ICD-10-CM

## 2021-01-20 MED ORDER — OXYCODONE HCL 5 MG PO TABS: 5.0000 mg | ORAL_TABLET | Freq: Four times a day (QID) | ORAL | 0 refills | Status: AC | PRN

## 2021-01-20 MED ORDER — OXYCODONE-ACETAMINOPHEN 5-325 MG PO TABS
1.0000 | ORAL_TABLET | Freq: Once | ORAL | Status: AC
  Administered 2021-01-20: 1 via ORAL
  Filled 2021-01-20: qty 1

## 2021-01-20 NOTE — ED Triage Notes (Signed)
[place]. Pt c/o left side pain, left hip and left leg pain. Denies hitting head or loc.

## 2021-01-20 NOTE — ED Provider Notes (Signed)
[facility] EMERGENCY DEPARTMENT Provider Note   CSN: [id number] Arrival date & time: 01/20/21  1314     History Chief Complaint  Patient presents with   Kathryn [name]    Kathryn [name] is a 50 y.o. female.  HPI  Patient with significant medical history of asthma, bipolar, chronic back pain, COPD, osteoarthritis hypertension presents to the emergency department after having mechanical fall.  sHe states today she was trying to get into an elevator unfortunately she tripped causing her fall onto her left side.  She denies hitting her head, losing conscious, is not on anticoagulant.  She states she landed directly onto her left ribs hip and knee.  She is having pain in those areas.  She states the pain is worsened when these areas are palpated or when she moves them, states pain improves while they are at rest.  She has not taking anything for pain at this time.  She denies  neck pain, back pain, chest pain, abdominal pain, worsening pedal edema.  He has no other complaints at this time.  Past Medical History:  Diagnosis Date   Anxiety    Arthritis    Asthma    Bipolar disorder ([place])    Chronic back pain    COPD (chronic obstructive pulmonary disease) (HCC)    Chronic bronchitis   Depression    GERD (gastroesophageal reflux disease)    Hypertension    Neuropathy    Osteoarthritis of left knee, 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   Sleep apnea 04/2020   GETTING A cpap   Suicidal ideation 12/24/2018    Patient Active Problem List   Diagnosis Date Noted   Encounter for examination following treatment at hospital 01/17/2021   Preoperative examination 01/17/2021   Bilateral leg edema 10/18/2020   Morbid obesity ([place]) 10/18/2020   S/P lumbar fusion 09/14/2020   Acute deep vein thrombosis (DVT) of proximal vein of lower extremity ([place]) 09/14/2020   Spondylolisthesis at L4-L5 level 03/29/2020   Insomnia 03/19/2020   Mild persistent asthma without complication 12/45/8099   Need for immunization  against influenza 03/19/2020   Body mass index (BMI) 38.0-38.9, adult 03/19/2020   GERD (gastroesophageal reflux disease) 03/19/2020   Intermittent palpitations 12/17/2019   Snoring 12/17/2019   Excessive daytime sleepiness 12/17/2019   Nightmare disorder, 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   Enlarged uterus 02/20/2018   Screening for colorectal cancer 02/20/2018   OA (osteoarthritis) of knee 12/27/2017   MDD (major depressive disorder), recurrent episode, severe ([place]) 12/24/2017   Lumbar spinal stenosis 05/11/2017   Low vitamin B12 level 05/11/2017   Primary hypertension 01/11/2017    Past Surgical History:  Procedure Laterality Date   BACK SURGERY     DILATATION AND CURETTAGE/HYSTEROSCOPY WITH MINERVA N/A 06/09/2020   Procedure: DILATATION AND CURETTAGE/HYSTEROSCOPY WITH MINERVA;  Surgeon: Florian Buff, MD;  Location: [facility];  Service: Gynecology;  Laterality: N/A;   ESOPHAGOGASTRODUODENOSCOPY (EGD) WITH PROPOFOL N/A 12/25/2018   Procedure: ESOPHAGOGASTRODUODENOSCOPY (EGD) WITH PROPOFOL;  Surgeon: Rogene Houston, MD;  Location: [facility] END[facility] SUITE;  Service: Endoscopy;  Laterality: N/A;   PATELLA-FEMORAL ARTHROPLASTY Left 10/08/2018   Procedure: PATELLA-FEMORAL ARTHROPLASTY;  Surgeon: Marchia Bond, MD;  Location: [facility];  Service: Orthopedics;  Laterality: Left;   TUBAL LIGATION       OB History     Gravida  5   Para  3   Term  2   Preterm  1   AB  2   Living  3  SAB  1   IAB  1   Ectopic      Multiple      Live Births              Family History  Problem Relation Age of Onset   Gout Paternal Grandfather    Cirrhosis Paternal Grandfather    Hypertension Paternal Grandmother    Aneurysm Paternal Grandmother    Cirrhosis Maternal Grandmother    Cirrhosis Maternal Grandfather    Cancer Father    Cirrhosis Father    Cirrhosis Mother    Breast cancer Sister    Hypertension Sister    Bronchitis Daughter    Bronchitis Daughter    Asthma Son     Bronchitis Son    Migraines Neg Hx     Social History   Tobacco Use   Smoking status: Former    Types: Cigarettes    Quit date: 2016    Years since quitting: 6.7   Smokeless tobacco: Never   Tobacco comments:    wears nicotine patches  Vaping Use   Vaping Use: Never used  Substance Use Topics   Alcohol use: Not Currently   Drug use: Not Currently    Types: Cocaine    Comment: crack  last used 2016    Home Medications Prior to Admission medications   Medication Sig Start Date End Date Taking? Authorizing Provider  oxyCODONE (ROXICODONE) 5 MG immediate release tablet Take 1 tablet (5 mg total) by mouth every 6 (six) hours as needed for up to 2 days for severe pain. 01/20/21 01/22/21 Yes Marcello Fennel, PA-C  albuterol (PROVENTIL HFA;VENTOLIN HFA) 108 (90 Base) MCG/ACT inhaler Inhale 2 puffs into the lungs every 6 (six) hours as needed for wheezing or shortness of breath.    [provider]  albuterol (PROVENTIL) (2.5 MG/3ML) 0.083% nebulizer solution Take 2.5 mg by nebulization every 6 (six) hours as needed for wheezing or shortness of breath.    [provider]  amLODipine (NORVASC) 5 MG tablet TAKE 1 TABLET BY MOUTH ONCE A DAY FOR BLOOD PRESSURE. Patient taking differently: Take 5 mg by mouth daily. 10/26/20   Lindell Spar, MD  budesonide-formoterol (SYMBICORT) 80-4.5 MCG/ACT inhaler Inhale 2 puffs into the lungs 2 (two) times daily. 03/19/20   Lindell Spar, MD  celecoxib (CELEBREX) 200 MG capsule Take 1 capsule (200 mg total) by mouth daily. 11/23/20   Lindell Spar, MD  citalopram (CELEXA) 20 MG tablet TAKE ONE TABLET BY MOUTH ONCE DAILY. 12/27/20   Lindell Spar, MD  cloNIDine (CATAPRES) 0.1 MG tablet TAKE 1 TABLET BY MOUTH ONCE DAILY FOR BLOOD PRESSURE AND ANXIETY. 01/13/21   Lindell Spar, MD  Cyanocobalamin (VITAMIN B 12 PO) Take 1 tablet by mouth daily.    [provider]  DULoxetine (CYMBALTA) 30 MG capsule Take 30 mg by mouth in the  morning and at bedtime. 11/26/20   [provider]  furosemide (LASIX) 20 MG tablet TAKE ONE TABLET BY MOUTH ONCE DAILY. Patient taking differently: Take 20 mg by mouth daily. 12/13/20   Lindell Spar, MD  hydrocortisone (ANUSOL-HC) 2.5 % rectal cream Place 1 application rectally 2 (two) times daily. 11/18/20   Lindell Spar, MD  hydrOXYzine (VISTARIL) 50 MG capsule Take 1 capsule (50 mg total) by mouth daily as needed for anxiety (Insomnia). 11/18/20   Lindell Spar, MD  methylPREDNISolone (MEDROL DOSEPAK) 4 MG TBPK tablet Day 1: 8 mg PO before breakfast,  4 mg after lunch and after dinner, and 8 mg at bedtime  Day 2: 4 mg PO before breakfast, after lunch, and after dinner and 8 mg at bedtime  Day 3: 4 mg PO before breakfast, after lunch, after dinner, and at bedtime  Day 4: 4 mg PO before breakfast, after lunch, and at bedtime  Day 5: 4 mg PO before breakfast and at bedtime  Day 6: 4 mg PO before breakfast 12/17/20   Varney Biles, MD  omeprazole (PRILOSEC) 20 MG capsule TAKE 1 CAPSULE BY MOUTH ONCE A DAY. 12/13/20   Lindell Spar, MD  oxyCODONE-acetaminophen (PERCOCET/ROXICET) 5-325 MG tablet Take 1 tablet by mouth every 6 (six) hours as needed for severe pain. 12/16/20   Varney Biles, MD  potassium chloride SA (KLOR-CON) 20 MEQ tablet Take 1 tablet (20 mEq total) by mouth daily. 10/12/20   Fayrene Helper, MD  predniSONE (STERAPRED UNI-PAK 21 TAB) 10 MG (21) TBPK tablet Take by mouth daily. Take 6 tabs by mouth daily  for 2 days, then 5 tabs for 2 days, then 4 tabs for 2 days, then 3 tabs for 2 days, 2 tabs for 2 days, then 1 tab by mouth daily for 2 days 01/17/21   Garald Balding, PA-C  pregabalin (LYRICA) 150 MG capsule Take 150 mg by mouth 2 (two) times daily. 11/26/20   [provider]  pregabalin (LYRICA) 75 MG capsule Take 1 capsule (75 mg total) by mouth 2 (two) times daily. 01/17/21   Lindell Spar, MD  Relugolix-Estradiol-Norethind (MYFEMBREE) 40-1-0.5 MG TABS  Take 1 tablet by mouth daily. 08/06/20   Florian Buff, MD  tiZANidine (ZANAFLEX) 2 MG tablet TAKE 1 TABLET BY MOUTH TWICE DAILY AS NEEDED FOR MUSCLE SPASMS 01/07/21   Lindell Spar, MD    Allergies    Clonopin [clonazepam], Fish allergy, Flexeril [cyclobenzaprine hcl], Ibuprofen, Shellfish allergy, Tylenol [acetaminophen], Ace inhibitors, Cyclobenzaprine, Tramadol, and Trazodone and nefazodone  Review of Systems   Review of Systems  Constitutional:  Negative for chills and fever.  HENT:  Negative for congestion.   Respiratory:  Negative for shortness of breath.   Cardiovascular:  Negative for chest pain.  Gastrointestinal:  Negative for abdominal pain, diarrhea, nausea and vomiting.  Genitourinary:  Negative for enuresis.  Musculoskeletal:  Negative for back pain and neck pain.       Pain along patient's left ribs, hip, knee.  Skin:  Negative for rash.  Neurological:  Negative for dizziness.  Hematological:  Does not bruise/bleed easily.   Physical Exam Updated Vital Signs BP 109/65 (BP Location: Right Arm)   Pulse 90   Temp 98.8 F (37.1 C) (Oral)   Resp 19   Ht 4\' 11"  (1.499 m)   Wt 81.6 kg   LMP 06/07/2020   SpO2 99%   BMI 36.36 kg/m   Physical Exam Vitals and nursing note reviewed.  Constitutional:      General: She is not in acute distress.    Appearance: She is not ill-appearing.  HENT:     Head: Normocephalic and atraumatic.     Comments: No deformities of the head present, no raccoon eyes or battle sign present, head was nontender to palpation.    Nose: No congestion.  Eyes:     Extraocular Movements: Extraocular movements intact.     Conjunctiva/sclera: Conjunctivae normal.  Cardiovascular:     Rate and Rhythm: Normal rate and regular rhythm.     Pulses: Normal pulses.  Heart sounds: No murmur heard.   No friction rub. No gallop.  Pulmonary:     Effort: No respiratory distress.     Breath sounds: No wheezing, rhonchi or rales.     Comments: Chest  was palpated she had no tenderness along the mid axillary line of her left fifth and sixth rib no crepitus or deformities present. Abdominal:     Palpations: Abdomen is soft.     Tenderness: There is no abdominal tenderness. There is no right CVA tenderness or left CVA tenderness.  Musculoskeletal:     Cervical back: Neck supple. No tenderness.     Comments: Spine was palpated was nontender to palpation, no step-off or deformities present.  Patient tenderness along her left hip, tenderness was located mainly on her trochanter, no crepitus or deformities present, no pelvis instability, no leg shortening, no internal or external rotation of the lower extremities.  She also pain along her left knee located on the lateral tibial plateau, no crepitus or deformities noted.  She had full range of motion in all 4 extremities, neurovascular fully intact.  Skin:    General: Skin is warm and dry.  Neurological:     Mental Status: She is alert.     Comments: No facial asymmetry, no difficulty word finding, no slurring of her words, able to follow two-step commands, no unilateral weakness present.  Psychiatric:        Mood and Affect: Mood normal.    ED Results / Procedures / Treatments   Labs (all labs ordered are listed, but only abnormal results are displayed) Labs Reviewed - No data to display  EKG None  Radiology DG Ribs Unilateral W/Chest Left  Result Date: 01/20/2021 CLINICAL DATA:  Left rib pain after fall today. EXAM: LEFT RIBS AND CHEST - 3+ VIEW COMPARISON:  November 12, 2020. FINDINGS: No fracture or other bone lesions are seen involving the ribs. There is no evidence of pneumothorax or pleural effusion. Both lungs are clear. Heart size and mediastinal contours are within normal limits. IMPRESSION: Negative. Electronically Signed   By: Marijo Conception M.D.   On: 01/20/2021 15:53   DG Knee Complete 4 Views Left  Result Date: 01/20/2021 CLINICAL DATA:  Right knee pain after fall today. EXAM:  LEFT KNEE - COMPLETE 4+ VIEW COMPARISON:  January 16, 2019. FINDINGS: Status post partial hemiarthroplasty involving the intercondylar notch of the distal left femur. No fracture or dislocation is noted. Mild narrowing of medial joint space is noted with osteophyte formation. IMPRESSION: Degenerative and postsurgical changes as described above. No acute abnormality seen. Electronically Signed   By: Marijo Conception M.D.   On: 01/20/2021 15:55   DG Hip Unilat With Pelvis 2-3 Views Left  Result Date: 01/20/2021 CLINICAL DATA:  Left hip pain after fall today. EXAM: DG HIP (WITH OR WITHOUT PELVIS) 2-3V LEFT COMPARISON:  None. FINDINGS: There is no evidence of hip fracture or dislocation. Mild osteophyte formation is seen involving both hips. 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 No acute abnormality seen. Electronically Signed   By: Marijo Conception M.D.   On: 01/20/2021 15:57    Procedures Procedures   Medications Ordered in ED Medications  oxyCODONE-acetaminophen (PERCOCET/ROXICET) 5-325 MG per tablet 1 tablet (1 tablet Oral Given 01/20/21 1459)    ED Course  I have reviewed the triage vital signs and the nursing notes.  Pertinent labs & imaging results that were available during my care of the patient were reviewed  by me and considered in my medical decision making (see chart for details).    MDM Rules/Calculators/A&P                          Initial impression-patient presents after a fall.  She is alert, does not appear acute stress, vital signs reassuring.  Likely patient sustained possible orthopedic injuries, will obtain imaging for further evaluation, will provide patient with pain medication.  Work-up-DG of ribs, hip, knee all negative for acute findings.  Patient was given oxycodone she does have an allergy to Tylenol but after reviewing patient's chart she has had oxycodone with Tylenol multiple times without issues.  Rule out- low suspicion for intracranial head  bleed as patient denies loss of conscious, is not on anticoagulant, she does not endorse headaches, paresthesia/weakness in the upper and lower extremities, no focal deficits present on my exam.  Low suspicion for spinal cord abnormality or spinal fracture spine was palpated was nontender to palpation, patient has full range of motion in the upper and lower extremities.  Low suspicion for pneumothorax as lung sounds are clear bilaterally, x-ray is negative for acute findings.  Low suspicion for intra-abdominal trauma as abdomen soft nontender to palpation.  Low suspicion for orthopedic injury as imaging is negative for acute findings.   Plan-  Fall-likely patient suffering from muscular strains, unfortunately patient is allergic to NSAIDs, Tylenol and muscle relaxers, will provide her with a short and short course of narcotics.  Follow-up with PCP as needed.  Vital signs have remained stable, no indication for hospital admission.  Patient given at home care as well strict return precautions.  Patient verbalized that they understood agreed to said plan.  Final Clinical Impression(s) / ED Diagnoses Final diagnoses:  Fall, initial encounter    Rx / DC Orders ED Discharge Orders          Ordered    oxyCODONE (ROXICODONE) 5 MG immediate release tablet  Every 6 hours PRN        01/20/21 1609             Marcello Fennel, PA-C 01/20/21 1615    Carmin Muskrat, MD 01/21/21 1401

## 2021-01-20 NOTE — Telephone Encounter (Signed)
Transition Care Management Follow-up Telephone Call Date of discharge and from where: 01/20/2021 from [facility] ED How have you been since you were released from the hospital? Pt seen for another fall. [place] Pt is in a lot of pain due to several falls she has experienced. Pt was discharged while I was on the phone with her.  Any questions or concerns? No  Items Reviewed: Did the pt receive and understand the discharge instructions provided? Yes  Medications obtained and verified? Yes  Other? No  Any new allergies since your discharge? No  Dietary orders reviewed? No Do you have support at home? Yes   Functional Questionnaire: (I = Independent and D = Dependent) ADLs: I  Bathing/Dressing- I  Meal Prep- I  Eating- I  Maintaining continence- I  Transferring/Ambulation- I  Managing Meds- I   Follow up appointments reviewed:  PCP Hospital f/u appt confirmed? No  Pt saw PCP after visit to ED on 01/17/2021 Specialist Hospital f/u appt confirmed? Yes  Knee surgery scheduled for 02/11/2021 Are transportation arrangements needed? No  If their condition worsens, is the pt aware to call PCP or go to the Emergency Dept.? Yes Was the patient provided with contact information for the PCP's office or ED? Yes Was to pt encouraged to call back with questions or concerns? Yes

## 2021-01-20 NOTE — Discharge Instructions (Addendum)
Imaging was reassuring.  Likely you have a muscular strain, I recommend rest keeping the injury elevated and ice on the area this help decrease inflammation and swelling. I have given you a short course of narcotics please take as prescribed.  This medication can make you drowsy do not consume alcohol or operate heavy machinery when taking this medication.    Please follow-up with PCP as needed.  Come back to the emergency department if you develop chest pain, shortness of breath, severe abdominal pain, uncontrolled nausea, vomiting, diarrhea.

## 2021-01-20 NOTE — Telephone Encounter (Signed)
Transition Care Management Follow-up Telephone Call Date of discharge and from where: 01/17/2021 from [facility] ED How have you been since you were released from the hospital? [place]  Any questions or concerns? No  Items Reviewed: Did the pt receive and understand the discharge instructions provided? Yes  Medications obtained and verified? Yes  Other? No  Any new allergies since your discharge? No  Dietary orders reviewed? No Do you have support at home? Yes   Functional Questionnaire: (I = Independent and D = Dependent) ADLs: I  Bathing/Dressing- I  Meal Prep- I  Eating- I  Maintaining continence- I  Transferring/Ambulation- I  Managing Meds- I   Follow up appointments reviewed:  PCP Hospital f/u appt confirmed? No  Saw PCP after ED visit 01/17/2021 Specialist Hospital f/u appt confirmed? Yes  Pt is scheduled for knee surgery on 02/11/2021.  Are transportation arrangements needed? No  If their condition worsens, is the pt aware to call PCP or go to the Emergency Dept.? Yes Was the patient provided with contact information for the PCP's office or ED? Yes Was to pt encouraged to call back with questions or concerns? Yes

## 2021-01-21 NOTE — Progress Notes (Signed)
Please enter orders for surgery scheduled for 02-11-21 and PAT scheduled for 01-31-21.

## 2021-01-23 ENCOUNTER — Other Ambulatory Visit: Payer: Self-pay

## 2021-01-23 ENCOUNTER — Encounter (HOSPITAL_COMMUNITY): Payer: Self-pay | Admitting: Emergency Medicine

## 2021-01-23 ENCOUNTER — Emergency Department (HOSPITAL_COMMUNITY): Payer: 59

## 2021-01-23 ENCOUNTER — Emergency Department (HOSPITAL_COMMUNITY)
Admission: EM | Admit: 2021-01-23 | Discharge: 2021-01-27 | Disposition: A | Discharge status: Hospice | Payer: 59 | Attending: Emergency Medicine | Admitting: Emergency Medicine

## 2021-01-23 DIAGNOSIS — Y900 Blood alcohol level of less than 20 mg/100 ml: Secondary | ICD-10-CM | POA: Insufficient documentation

## 2021-01-23 DIAGNOSIS — Z79899 Other long term (current) drug therapy: Secondary | ICD-10-CM | POA: Diagnosis not present

## 2021-01-23 DIAGNOSIS — J449 Chronic obstructive pulmonary disease, unspecified: Secondary | ICD-10-CM | POA: Diagnosis not present

## 2021-01-23 DIAGNOSIS — R45851 Suicidal ideations: Secondary | ICD-10-CM | POA: Diagnosis not present

## 2021-01-23 DIAGNOSIS — Z87891 Personal history of nicotine dependence: Secondary | ICD-10-CM | POA: Insufficient documentation

## 2021-01-23 DIAGNOSIS — J4530 Mild persistent asthma, uncomplicated: Secondary | ICD-10-CM | POA: Insufficient documentation

## 2021-01-23 DIAGNOSIS — Z7951 Long term (current) use of inhaled steroids: Secondary | ICD-10-CM | POA: Diagnosis not present

## 2021-01-23 DIAGNOSIS — U071 COVID-19: Secondary | ICD-10-CM | POA: Insufficient documentation

## 2021-01-23 DIAGNOSIS — R0789 Other chest pain: Secondary | ICD-10-CM | POA: Diagnosis not present

## 2021-01-23 DIAGNOSIS — I10 Essential (primary) hypertension: Secondary | ICD-10-CM | POA: Insufficient documentation

## 2021-01-23 DIAGNOSIS — F1494 Cocaine use, unspecified with cocaine-induced mood disorder: Secondary | ICD-10-CM | POA: Diagnosis present

## 2021-01-23 DIAGNOSIS — R079 Chest pain, unspecified: Secondary | ICD-10-CM | POA: Diagnosis present

## 2021-01-23 DIAGNOSIS — F332 Major depressive disorder, recurrent severe without psychotic features: Secondary | ICD-10-CM | POA: Diagnosis present

## 2021-01-23 LAB — CBC WITH DIFFERENTIAL/PLATELET
Abs Immature Granulocytes: 0.09 10*3/uL — ABNORMAL HIGH (ref 0.00–0.07)
Basophils Absolute: 0.1 10*3/uL (ref 0.0–0.1)
Basophils Relative: 0 %
Eosinophils Absolute: 0.2 10*3/uL (ref 0.0–0.5)
Eosinophils Relative: 2 %
HCT: 37.2 % (ref 36.0–46.0)
Hemoglobin: 12.2 g/dL (ref 12.0–15.0)
Immature Granulocytes: 1 %
Lymphocytes Relative: 31 %
Lymphs Abs: 3.7 10*3/uL (ref 0.7–4.0)
MCH: 28 pg (ref 26.0–34.0)
MCHC: 32.8 g/dL (ref 30.0–36.0)
MCV: 85.3 fL (ref 80.0–100.0)
Monocytes Absolute: 0.8 10*3/uL (ref 0.1–1.0)
Monocytes Relative: 7 %
Neutro Abs: 7.1 10*3/uL (ref 1.7–7.7)
Neutrophils Relative %: 59 %
Platelets: 670 10*3/uL — ABNORMAL HIGH (ref 150–400)
RBC: 4.36 MIL/uL (ref 3.87–5.11)
RDW: 15.4 % (ref 11.5–15.5)
WBC: 11.9 10*3/uL — ABNORMAL HIGH (ref 4.0–10.5)
nRBC: 0 % (ref 0.0–0.2)

## 2021-01-23 LAB — COMPREHENSIVE METABOLIC PANEL
ALT: 24 U/L (ref 0–44)
AST: 21 U/L (ref 15–41)
Albumin: 3.7 g/dL (ref 3.5–5.0)
Alkaline Phosphatase: 91 U/L (ref 38–126)
Anion gap: 10 (ref 5–15)
BUN: 8 mg/dL (ref 6–20)
CO2: 23 mmol/L (ref 22–32)
Calcium: 8.5 mg/dL — ABNORMAL LOW (ref 8.9–10.3)
Chloride: 100 mmol/L (ref 98–111)
Creatinine, Ser: 0.69 mg/dL (ref 0.44–1.00)
GFR, Estimated: >60 mL/min (ref >60)
Glucose, Bld: 99 mg/dL (ref 70–99)
Potassium: 3.5 mmol/L (ref 3.5–5.1)
Sodium: 133 mmol/L — ABNORMAL LOW (ref 135–145)
Total Bilirubin: 0.8 mg/dL (ref 0.3–1.2)
Total Protein: 7.3 g/dL (ref 6.5–8.1)

## 2021-01-23 LAB — ETHANOL: Alcohol, Ethyl (B): <10 mg/dL (ref <10)

## 2021-01-23 LAB — RAPID URINE DRUG SCREEN, HOSP PERFORMED
Amphetamines: NOT DETECTED
Barbiturates: NOT DETECTED
Benzodiazepines: NOT DETECTED
Cocaine: POSITIVE — AB
Opiates: POSITIVE — AB
Tetrahydrocannabinol: NOT DETECTED

## 2021-01-23 LAB — RESP PANEL BY RT-PCR (FLU A&B, COVID) ARPGX2
Influenza A by PCR: NEGATIVE
Influenza B by PCR: NEGATIVE
SARS Coronavirus 2 by RT PCR: POSITIVE — AB

## 2021-01-23 MED ORDER — PREGABALIN 75 MG PO CAPS: 150.0000 mg | ORAL_CAPSULE | Freq: Two times a day (BID) | ORAL | Status: DC

## 2021-01-23 MED ORDER — CELECOXIB 100 MG PO CAPS
200.0000 mg | ORAL_CAPSULE | Freq: Every day | ORAL | Status: DC
  Administered 2021-01-23 – 2021-01-27 (×5): 200 mg via ORAL
  Filled 2021-01-23 (×5): qty 2

## 2021-01-23 MED ORDER — OXYCODONE HCL 5 MG PO TABS
10.0000 mg | ORAL_TABLET | Freq: Once | ORAL | Status: AC
  Administered 2021-01-23: 10 mg via ORAL
  Filled 2021-01-23: qty 2

## 2021-01-23 MED ORDER — MOMETASONE FURO-FORMOTEROL FUM 100-5 MCG/ACT IN AERO
2.0000 | INHALATION_SPRAY | Freq: Two times a day (BID) | RESPIRATORY_TRACT | Status: DC
  Administered 2021-01-23 – 2021-01-27 (×9): 2 via RESPIRATORY_TRACT
  Filled 2021-01-23 (×2): qty 8.8

## 2021-01-23 MED ORDER — CITALOPRAM HYDROBROMIDE 20 MG PO TABS
20.0000 mg | ORAL_TABLET | Freq: Every day | ORAL | Status: DC
  Administered 2021-01-23 – 2021-01-24 (×2): 20 mg via ORAL
  Filled 2021-01-23 (×2): qty 1

## 2021-01-23 MED ORDER — AMLODIPINE BESYLATE 5 MG PO TABS
5.0000 mg | ORAL_TABLET | Freq: Every day | ORAL | Status: DC
  Administered 2021-01-23 – 2021-01-27 (×5): 5 mg via ORAL
  Filled 2021-01-23 (×5): qty 1

## 2021-01-23 MED ORDER — FUROSEMIDE 40 MG PO TABS
20.0000 mg | ORAL_TABLET | Freq: Every day | ORAL | Status: DC
  Administered 2021-01-23 – 2021-01-27 (×5): 20 mg via ORAL
  Filled 2021-01-23 (×5): qty 1

## 2021-01-23 MED ORDER — POTASSIUM CHLORIDE CRYS ER 20 MEQ PO TBCR
40.0000 meq | EXTENDED_RELEASE_TABLET | Freq: Every day | ORAL | Status: DC
  Administered 2021-01-23 – 2021-01-27 (×5): 40 meq via ORAL
  Filled 2021-01-23 (×5): qty 2

## 2021-01-23 MED ORDER — ALBUTEROL SULFATE (2.5 MG/3ML) 0.083% IN NEBU
2.5000 mg | INHALATION_SOLUTION | Freq: Four times a day (QID) | RESPIRATORY_TRACT | Status: DC | PRN
  Administered 2021-01-24: 2.5 mg via RESPIRATORY_TRACT

## 2021-01-23 MED ORDER — ALBUTEROL SULFATE HFA 108 (90 BASE) MCG/ACT IN AERS
2.0000 | INHALATION_SPRAY | Freq: Four times a day (QID) | RESPIRATORY_TRACT | Status: DC | PRN
  Administered 2021-01-23 – 2021-01-26 (×3): 2 via RESPIRATORY_TRACT
  Filled 2021-01-23: qty 6.7

## 2021-01-23 MED ORDER — TIZANIDINE HCL 4 MG PO TABS: 2.0000 mg | ORAL_TABLET | Freq: Two times a day (BID) | ORAL | Status: DC | PRN

## 2021-01-23 MED ORDER — PREGABALIN 75 MG PO CAPS
75.0000 mg | ORAL_CAPSULE | Freq: Two times a day (BID) | ORAL | Status: DC
  Administered 2021-01-23 – 2021-01-27 (×9): 75 mg via ORAL
  Filled 2021-01-23 (×9): qty 1

## 2021-01-23 MED ORDER — PANTOPRAZOLE SODIUM 40 MG PO TBEC
40.0000 mg | DELAYED_RELEASE_TABLET | Freq: Every day | ORAL | Status: DC
  Administered 2021-01-23 – 2021-01-27 (×5): 40 mg via ORAL
  Filled 2021-01-23 (×5): qty 1

## 2021-01-23 MED ORDER — HYDROXYZINE HCL 25 MG PO TABS
50.0000 mg | ORAL_TABLET | Freq: Every day | ORAL | Status: DC | PRN
  Administered 2021-01-24: 50 mg via ORAL
  Filled 2021-01-23: qty 2

## 2021-01-23 MED ORDER — RELUGOLIX-ESTRADIOL-NORETHIND 40-1-0.5 MG PO TABS: 1.0000 | ORAL_TABLET | Freq: Every day | ORAL | Status: DC

## 2021-01-23 MED ORDER — CLONIDINE HCL 0.1 MG PO TABS
0.1000 mg | ORAL_TABLET | Freq: Every day | ORAL | Status: DC
  Administered 2021-01-23 – 2021-01-27 (×5): 0.1 mg via ORAL
  Filled 2021-01-23 (×5): qty 1

## 2021-01-23 MED ORDER — LIDOCAINE 5 % EX PTCH
1.0000 | MEDICATED_PATCH | CUTANEOUS | Status: DC
  Administered 2021-01-23 – 2021-01-26 (×5): 1 via TRANSDERMAL
  Filled 2021-01-23 (×5): qty 1

## 2021-01-23 MED ORDER — DULOXETINE HCL 30 MG PO CPEP
30.0000 mg | ORAL_CAPSULE | Freq: Every day | ORAL | Status: DC
  Administered 2021-01-23 – 2021-01-27 (×5): 30 mg via ORAL
  Filled 2021-01-23 (×5): qty 1

## 2021-01-23 NOTE — ED Notes (Signed)
Pt has ate bag of chips- pt given graham crackers (two packs) per request. Pt has gingerale and water at bedside. Pt has changed into purple scrubs and is a/w security to wand. Pt clothes nightgown and socks placed in belongings bag, pt black purse, all items labled with pt sticker and locked in [facility] locker room. Pt given non-slick socks.

## 2021-01-23 NOTE — ED Notes (Signed)
Pt has been medically cleared by Dr Dayna Barker

## 2021-01-23 NOTE — ED Notes (Signed)
Pt given lunch tray at this time

## 2021-01-23 NOTE — ED Notes (Signed)
Lab notified that pt still needs blood drawn

## 2021-01-23 NOTE — ED Triage Notes (Signed)
Pt c/o left sided rib/chest pain since being discharged the other day from a fall. Pt also states she has been having suicidal thoughts tonight after drinking a beer. Pt states she was headed to bed with a knife and her daughter stopped her and brought her to be evaluated.

## 2021-01-23 NOTE — ED Notes (Signed)
Pt requesting pain medication for neuropathy, MD made aware, awaiting orders at this time.

## 2021-01-23 NOTE — ED Notes (Signed)
Tele-sitter called- spoke with Latoya who is watching pt now via tele-sitter.

## 2021-01-23 NOTE — ED Notes (Signed)
ED provider notified to renew orders for telesitter per [facility]
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
## 2021-01-23 NOTE — ED Notes (Signed)
Pt medically cleared at this time.

## 2021-01-23 NOTE — Progress Notes (Signed)
Per Lockie Mola, patient meets criteria for inpatient treatment. There are no available or appropriate beds at [facility] today. CSW faxed referrals to the following facilities for review:  [facility]  Pending - No Request Sent N/A [address], [place] [address] [phone] --  [place]  Pending - No Request Sent N/A [address] [place] [address] [phone] --  [facility]  Pending - No Request [address] [place] [address] [phone] --  [place] [address] [place] [address] [phone] --  [place]  Pending - No Request Sent N/A [address] [place] [address] [phone] --  [facility]  Pending - No Request Sent N/A [address] [place], [place] [address] [phone] --  [facility]  Pending - No Request Sent N/A [address] [place] [address] [place] --  [facility]  Pending - No Request Sent N/A [address], [place] [address] [facility]  Pending - No Request Sent N/A [address] [place] [address] [phone] --  [facility]  Pending - No Request Sent N/A [address] [place] [address] [phone] --  [place]  Pending - No Request Sent N/A [address] [place] [address] [phone] --  [facility]  Pending - No Request Sent N/A [place], [place] [address] [place] --  [place]  Pending - No Request Sent N/A [address] [place] [address] [phone] --   [facility]  Pending - No Request Sent N/A [address], [place] [employer] [address] [phone] --  [facility]  Pending - No Request Sent N/A [address], [place] [address] [phone] --  [facility]  Pending - No Request Sent N/A [address] [place] [address] [phone] --   TTS will continue to seek bed placement.  [place]ie [place], MSW, [place], LCAS-A Phone: [phone] Disposition/TOC

## 2021-01-23 NOTE — ED Notes (Signed)
Pt informed that she tested positive for covid- precautions sign placed on door.

## 2021-01-23 NOTE — ED Notes (Signed)
Patient transported to CT with sitter.
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
## 2021-01-23 NOTE — ED Notes (Signed)
This nurse was informed that the patient was pacing the room and removing clothing and sheets off bed. Pt removed all of her clothing and attempted to clean self with the purple sani wipes. Another RN offered patient a bath to which patient refused. Pt given new scrubs and linen. Purple sani wipes removed from pts room.

## 2021-01-24 DIAGNOSIS — R45851 Suicidal ideations: Secondary | ICD-10-CM

## 2021-01-24 DIAGNOSIS — R0789 Other chest pain: Secondary | ICD-10-CM | POA: Diagnosis not present

## 2021-01-24 DIAGNOSIS — U071 COVID-19: Secondary | ICD-10-CM | POA: Diagnosis not present

## 2021-01-24 MED ORDER — CITALOPRAM HYDROBROMIDE 20 MG PO TABS: 20.0000 mg | ORAL_TABLET | Freq: Every day | ORAL | Status: DC

## 2021-01-24 MED ORDER — CITALOPRAM HYDROBROMIDE 20 MG PO TABS
40.0000 mg | ORAL_TABLET | Freq: Every day | ORAL | Status: DC
  Administered 2021-01-25 – 2021-01-26 (×2): 40 mg via ORAL
  Filled 2021-01-24 (×2): qty 2

## 2021-01-24 NOTE — ED Notes (Signed)
Clean purple scrubs given to pt. Pt took a chuck pad and brief out of the cabinet to use on her bed/self

## 2021-01-24 NOTE — ED Notes (Signed)
Given pt 2 packs of saltines, graham crackers and peanut butter crackers- per pt request. Pt given a sheet per request for blanket. (No blankets in unit at this time). Pt requesting dilaudid patch for back at this time.

## 2021-01-24 NOTE — Progress Notes (Signed)
Patient ID: Kathryn [name], female   DOB: [birth year], 50 y.o.   MRN: [id number]  Patient was reevaluated today via telecart. She continues to endorse SI with a plan but stated "I can't get to anything here or I would do it." Patient is COVID + but psychiatry will follow and continue to recommend inpatient admission when an appropriate bed is found. Patient has a history of depression, on Celexa 20 mg but stated her dose has never been increased. Will increase the dose while she is in the emergency room and follow for effectiveness.

## 2021-01-24 NOTE — ED Notes (Signed)
Pt states she is unable to fit in the purple pants that were given

## 2021-01-24 NOTE — Progress Notes (Signed)
Patient ID: Kathryn [name], female   DOB: [birth year], 50 y.o.   MRN: [id number]  This provider called [facility] [phone] to request cart be placed in patient's room for tele-assessment. Attempted to call cart x 3, no answer. Patient is COVID +, patient is not appropriate for inpatient admission at this time due to COVID + status.

## 2021-01-24 NOTE — ED Provider Notes (Signed)
Emergency Medicine Observation Re-evaluation Note  Kathryn [name] is a 50 y.o. female, seen on rounds today.  Pt initially presented to the ED for complaints of Chest Pain and Suicidal Currently, the patient is resting quietly.  Physical Exam  BP 130/89 (BP Location: Left Arm)   Pulse 84   Temp 98.7 F (37.1 C)   Resp 20   Ht 4\' 11"  (1.499 m)   Wt 81.6 kg   LMP 06/07/2020   SpO2 99%   BMI 36.36 kg/m  Physical Exam General: No acute distress Cardiac: Well-perfused Lungs: Nonlabored Psych: Cooperative  ED Course / MDM  EKG:   I have reviewed the labs performed to date as well as medications administered while in observation.  Recent changes in the last 24 hours include TOC pursuing inpatient bed.  Plan  Current plan is for inpatient bed.  DERRIAN RODAK is not under involuntary commitment.     Hayden Rasmussen, MD 01/24/21 1321

## 2021-01-25 DIAGNOSIS — U071 COVID-19: Secondary | ICD-10-CM | POA: Diagnosis not present

## 2021-01-25 MED ORDER — BENZONATATE 100 MG PO CAPS
200.0000 mg | ORAL_CAPSULE | Freq: Once | ORAL | Status: AC
  Administered 2021-01-25: 200 mg via ORAL
  Filled 2021-01-25: qty 2

## 2021-01-25 NOTE — ED Notes (Signed)
Pt had phone in room from earlier, says "it needs to be charged". Phone taken out of pt room.  Pt had another phone call from friend, pt was given portable phone and informed that she had 5 minutes for phone call.

## 2021-01-26 DIAGNOSIS — R0789 Other chest pain: Secondary | ICD-10-CM | POA: Diagnosis not present

## 2021-01-26 DIAGNOSIS — U071 COVID-19: Secondary | ICD-10-CM | POA: Diagnosis not present

## 2021-01-26 DIAGNOSIS — F1494 Cocaine use, unspecified with cocaine-induced mood disorder: Secondary | ICD-10-CM | POA: Diagnosis not present

## 2021-01-26 DIAGNOSIS — R45851 Suicidal ideations: Secondary | ICD-10-CM | POA: Diagnosis not present

## 2021-01-26 NOTE — ED Provider Notes (Signed)
Emergency Medicine Observation Re-evaluation Note  Kathryn [name] is a 50 y.o. female, seen on rounds today.  Pt initially presented to the ED for complaints of Chest Pain and Suicidal Currently, the patient is resting quietly.  Physical Exam  BP 132/75   Pulse 84   Temp 98.6 F (37 C)   Resp 18   Ht 4\' 11"  (1.499 m)   Wt 81.6 kg   LMP 06/07/2020   SpO2 99%   BMI 36.36 kg/m  Physical Exam General: No acute distress Cardiac: Well-perfused Lungs: Nonlabored Psych: Cooperative  ED Course / MDM  EKG:EKG Interpretation  Date/Time:  Sunday January 23 2021 03:09:16 EDT Ventricular Rate:  111 PR Interval:  139 QRS Duration: 85 QT Interval:  340 QTC Calculation: 462 R Axis:   84 Text Interpretation: Sinus tachycardia LAE, consider biatrial enlargement Confirmed by Nanda Quinton [phone]) on 01/24/2021 3:29:15 PM  I have reviewed the labs performed to date as well as medications administered while in observation.  Recent changes in the last 24 hours include psychiatry increasing dose of psychiatric medications.  Plan  Current plan is for psychiatric admission once clears COVID protocol.  Kathryn [name] is not under involuntary commitment.     Hayden Rasmussen, MD 01/26/21 [phone]

## 2021-01-26 NOTE — Progress Notes (Signed)
CSW contacted [facility]'s intake and was advised that they have not received the referral. CSW checked EPIC and saw where referral was sent via EPIC fax by Mervyn Gay on 01/26/21 at 9:35am and Jamaral, CCM on 01/23/21 at 4:10pm. CSW will follow back up with [facility] during this shift.    Benjaman Kindler, MSW, LCSWA 01/26/2021 7:30 PM

## 2021-01-26 NOTE — ED Notes (Addendum)
Spoke with [facility] and answered a few questions regarding pt. Facility accepting pt in the AM.  EDP made aware.

## 2021-01-26 NOTE — Progress Notes (Signed)
CSW faxed a [facility] referral for review to [facility] per Dr. Ronnie Derby request. The patient is currently COVID+ but is in her 5th day of quarantine. CSW will continue to follow-up to secure disposition.    Mariea Clonts, MSW, LCSW-A  9:36 AM 01/26/2021

## 2021-01-26 NOTE — ED Notes (Signed)
TTS at this time. 

## 2021-01-26 NOTE — Progress Notes (Addendum)
CSW called [facility] to follow-up on referral. CSW spoke with [place] from [place] who reports that intake is reviewing the referral. CSW provided [place] who assigned RN's phone number for further information; Celedonio Savage, RN; [phone]. Collier Salina advised that she would contact CSW back. Pt meets inpatient criteria per Merlyn Lot, NP. Situations is ongoing and CSW will continue to follow for potential bed offers with inpatient behavioral health placement.      Benjaman Kindler, MSW, [facility] 01/26/2021 10:31 PM

## 2021-01-26 NOTE — ED Notes (Signed)
Rn aware pt requesting a snack at this time. Kathryn [name]

## 2021-01-26 NOTE — Consult Note (Signed)
Telepsych Consultation   Reason for Consult:  Psychiatric Reassessment Referring Physician:  Dr. Merrily Pew Location of Patient:    [facility] Location of Provider: Other: virtual home office  Patient Identification: Kathryn [name] MRN:  [id number] Principal Diagnosis: 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[place]) Diagnosis:  Principal Problem:   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[place]) Active Problems:   MDD (major depressive disorder), recurrent episode, severe ([place])   Total Time spent with patient: 20 minutes  Interval Progress Note 01/26/2021: Subjective:   Kathryn [name] is a 50 y.o. female patient admitted with suicidal ideations with plan; admission urine drug screen was positive for cocaine and opiates.  Patient seen via telepsych by this provider; chart reviewed and consulted with Dr. Dwyane Dee on 01/26/21.  On evaluation Kathryn [name] reports she is feeling a little better after the increase of her citalopram to 40mg  po daily but continues to endorse suicidal ideations and plan to overdose on drugs if discharged today.  She is covid + , is asymptomatic but will continue to quarantine in the ED.  Psychiatry will continue to follow her and make medication recommendations for mood stabilization.    Interval Progress Note 01/24/2021: Patient was reevaluated today via telecart. She continues to endorse SI with a plan but stated "I can't get to anything here or I would do it." Patient is COVID + but psychiatry will follow and continue to recommend inpatient admission when an appropriate bed is found. Patient has a history of depression, on Celexa 20 mg but stated her dose has never been increased. Will increase the dose while she is in the emergency room and follow for effectiveness.  Past Psychiatric History: as outlined below  Risk to Self:  yes Risk to Others:  no Prior Inpatient Therapy:  yes  Prior Outpatient Therapy:  yes  Past  Medical History:  Past Medical History:  Diagnosis Date   Anxiety    Arthritis    Asthma    Bipolar disorder ([place])    Chronic back pain    COPD (chronic obstructive pulmonary disease) (HCC)    Chronic bronchitis   Depression    GERD (gastroesophageal reflux disease)    Hypertension    Neuropathy    Osteoarthritis of left knee, 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   Sleep apnea 04/2020   GETTING A cpap   Suicidal ideation 12/24/2018    Past Surgical History:  Procedure Laterality Date   BACK SURGERY     DILATATION AND CURETTAGE/HYSTEROSCOPY WITH MINERVA N/A 06/09/2020   Procedure: DILATATION AND CURETTAGE/HYSTEROSCOPY WITH MINERVA;  Surgeon: Florian Buff, MD;  Location: [facility];  Service: Gynecology;  Laterality: N/A;   ESOPHAGOGASTRODUODENOSCOPY (EGD) WITH PROPOFOL N/A 12/25/2018   Procedure: ESOPHAGOGASTRODUODENOSCOPY (EGD) WITH PROPOFOL;  Surgeon: Rogene Houston, MD;  Location: [facility];  Service: Endoscopy;  Laterality: N/A;   PATELLA-FEMORAL ARTHROPLASTY Left 10/08/2018   Procedure: PATELLA-FEMORAL ARTHROPLASTY;  Surgeon: Marchia Bond, MD;  Location: [facility];  Service: Orthopedics;  Laterality: Left;   TUBAL LIGATION     Family History:  Family History  Problem Relation Age of Onset   Gout Paternal Grandfather    Cirrhosis Paternal Grandfather    Hypertension Paternal Grandmother    Aneurysm Paternal Grandmother    Cirrhosis Maternal Grandmother    Cirrhosis Maternal Grandfather    Cancer Father    Cirrhosis Father    Cirrhosis Mother    Breast cancer Sister    Hypertension Sister    Bronchitis  Daughter    Bronchitis Daughter    Asthma Son    Bronchitis Son    Migraines Neg Hx    Family Psychiatric  History: unknown Social History:  Social History   Substance and Sexual Activity  Alcohol Use Not Currently     Social History   Substance and Sexual Activity  Drug Use Not Currently   Types: Cocaine   Comment: crack  last used 2016    Social History    Socioeconomic History   Marital status: Single    Spouse name: Not on file   Number of children: Not on file   Years of education: Not on file   Highest education level: Not on file  Occupational History   Not on file  Tobacco Use   Smoking status: Former    Types: Cigarettes    Quit date: 2016    Years since quitting: 6.7   Smokeless tobacco: Never   Tobacco comments:    wears nicotine patches  Vaping Use   Vaping Use: Never used  Substance and Sexual Activity   Alcohol use: Not Currently   Drug use: Not Currently    Types: Cocaine    Comment: crack  last used 2016   Sexual activity: Yes    Birth control/protection: Surgical    Comment: tubal  Other Topics Concern   Not on file  Social History Narrative   R handed    Lives with boyfriend   1 Cup of caffeine daily    Social Determinants of Health   Financial Resource Strain: Not on file  Food Insecurity: Not on file  Transportation Needs: Not on file  Physical Activity: Not on file  Stress: Not on file  Social Connections: Not on file   Additional Social History:    Allergies:   Allergies  Allergen Reactions   Clonopin [Clonazepam] Anaphylaxis    Swelling of the tongue and mouth.    Fish Allergy Anaphylaxis, Shortness Of Breath and Swelling   Flexeril [Cyclobenzaprine Hcl] Shortness Of Breath   Ibuprofen Anaphylaxis, Hives and Other (See Comments)   Shellfish Allergy Anaphylaxis   Tylenol [Acetaminophen] Anaphylaxis   Ace Inhibitors Cough   Cyclobenzaprine    Tramadol Nausea And Vomiting    Upset stomach   Trazodone And Nefazodone Hives    Labs: No results found for this or any previous visit (from the past 48 hour(s)).  Medications:  Current Facility-Administered Medications  Medication Dose Route Frequency Provider Last Rate Last Admin   albuterol (PROVENTIL) (2.5 MG/3ML) 0.083% nebulizer solution 2.5 mg  2.5 mg Nebulization Q6H PRN Mesner, Corene Cornea, MD   2.5 mg at 01/24/21 2044   albuterol  (VENTOLIN HFA) 108 (90 Base) MCG/ACT inhaler 2 puff  2 puff Inhalation Q6H PRN Mesner, Jason, MD   2 puff at 01/25/21 0834   amLODipine (NORVASC) tablet 5 mg  5 mg Oral Daily Mesner, Jason, MD   5 mg at 01/26/21 1014   celecoxib (CELEBREX) capsule 200 mg  200 mg Oral Daily Mesner, Jason, MD   200 mg at 01/26/21 1012   citalopram (CELEXA) tablet 40 mg  40 mg Oral Daily Ethelene Hal, NP   40 mg at 01/26/21 1014   cloNIDine (CATAPRES) tablet 0.1 mg  0.1 mg Oral Daily Mesner, Corene Cornea, MD   0.1 mg at 01/26/21 1014   DULoxetine (CYMBALTA) DR capsule 30 mg  30 mg Oral Daily Mesner, Jason, MD   30 mg at 01/26/21 1014   furosemide (LASIX)  tablet 20 mg  20 mg Oral Daily Mesner, Corene Cornea, MD   20 mg at 01/26/21 1013   hydrOXYzine (ATARAX/VISTARIL) tablet 50 mg  50 mg Oral Daily PRN Mesner, Corene Cornea, MD   50 mg at 01/24/21 2139   lidocaine (LIDODERM) 5 % 1 patch  1 patch Transdermal Q24H Mesner, Corene Cornea, MD   1 patch at 01/26/21 0800   mometasone-formoterol (DULERA) 100-5 MCG/ACT inhaler 2 puff  2 puff Inhalation BID Mesner, Corene Cornea, MD   2 puff at 01/26/21 0800   pantoprazole (PROTONIX) EC tablet 40 mg  40 mg Oral Daily Mesner, Corene Cornea, MD   40 mg at 01/26/21 1014   potassium chloride SA (KLOR-CON) CR tablet 40 mEq  40 mEq Oral Daily Mesner, Corene Cornea, MD   40 mEq at 01/26/21 1013   pregabalin (LYRICA) capsule 75 mg  75 mg Oral BID Mesner, Corene Cornea, MD   75 mg at 01/26/21 1013   tiZANidine (ZANAFLEX) tablet 2 mg  2 mg Oral BID PRN Mesner, Corene Cornea, MD       Current Outpatient Medications  Medication Sig Dispense Refill   albuterol (PROVENTIL HFA;VENTOLIN HFA) 108 (90 Base) MCG/ACT inhaler Inhale 2 puffs into the lungs every 6 (six) hours as needed for wheezing or shortness of breath.     albuterol (PROVENTIL) (2.5 MG/3ML) 0.083% nebulizer solution Take 2.5 mg by nebulization every 6 (six) hours as needed for wheezing or shortness of breath.     amLODipine (NORVASC) 5 MG tablet TAKE 1 TABLET BY MOUTH ONCE A DAY FOR BLOOD  PRESSURE. (Patient taking differently: Take 5 mg by mouth daily.) 90 tablet 0   budesonide-formoterol (SYMBICORT) 80-4.5 MCG/ACT inhaler Inhale 2 puffs into the lungs 2 (two) times daily. 1 each 5   celecoxib (CELEBREX) 200 MG capsule Take 1 capsule (200 mg total) by mouth daily. 30 capsule 0   Cholecalciferol (VITAMIN D) 50 MCG (2000 UT) CAPS Take 1 capsule by mouth daily.     citalopram (CELEXA) 20 MG tablet TAKE ONE TABLET BY MOUTH ONCE DAILY. 30 tablet 0   cloNIDine (CATAPRES) 0.1 MG tablet TAKE 1 TABLET BY MOUTH ONCE DAILY FOR BLOOD PRESSURE AND ANXIETY. 90 tablet 0   Cyanocobalamin (VITAMIN B 12 PO) Take 1 tablet by mouth daily.     DULoxetine (CYMBALTA) 30 MG capsule Take 30 mg by mouth 2 (two) times daily.     furosemide (LASIX) 20 MG tablet TAKE ONE TABLET BY MOUTH ONCE DAILY. (Patient taking differently: Take 20 mg by mouth daily.) 90 tablet 0   hydrocortisone (ANUSOL-HC) 2.5 % rectal cream Place 1 application rectally 2 (two) times daily. 30 g 0   hydrOXYzine (VISTARIL) 50 MG capsule Take 1 capsule (50 mg total) by mouth daily as needed for anxiety (Insomnia). 30 capsule 5   oxyCODONE (OXY IR/ROXICODONE) 5 MG immediate release tablet Take 5 mg by mouth every 4 (four) hours as needed for severe pain.     potassium chloride SA (KLOR-CON) 20 MEQ tablet Take 1 tablet (20 mEq total) by mouth daily. 30 tablet 0   predniSONE (STERAPRED UNI-PAK 21 TAB) 10 MG (21) TBPK tablet Take by mouth daily. Take 6 tabs by mouth daily  for 2 days, then 5 tabs for 2 days, then 4 tabs for 2 days, then 3 tabs for 2 days, 2 tabs for 2 days, then 1 tab by mouth daily for 2 days 42 tablet 0   pregabalin (LYRICA) 75 MG capsule Take 1 capsule (75 mg total) by mouth 2 (  two) times daily. 60 capsule 5   tiZANidine (ZANAFLEX) 2 MG tablet TAKE 1 TABLET BY MOUTH TWICE DAILY AS NEEDED FOR MUSCLE SPASMS 30 tablet 0   methylPREDNISolone (MEDROL DOSEPAK) 4 MG TBPK tablet Day 1: 8 mg PO before breakfast, 4 mg after lunch and  after dinner, and 8 mg at bedtime  Day 2: 4 mg PO before breakfast, after lunch, and after dinner and 8 mg at bedtime  Day 3: 4 mg PO before breakfast, after lunch, after dinner, and at bedtime  Day 4: 4 mg PO before breakfast, after lunch, and at bedtime  Day 5: 4 mg PO before breakfast and at bedtime  Day 6: 4 mg PO before breakfast (Patient not taking: Reported on 01/23/2021) 21 tablet 0   omeprazole (PRILOSEC) 20 MG capsule TAKE 1 CAPSULE BY MOUTH ONCE A DAY. (Patient not taking: Reported on 01/23/2021) 90 capsule 0   oxyCODONE-acetaminophen (PERCOCET/ROXICET) 5-325 MG tablet Take 1 tablet by mouth every 6 (six) hours as needed for severe pain. (Patient not taking: Reported on 01/23/2021) 6 tablet 0   Relugolix-Estradiol-Norethind (MYFEMBREE) 40-1-0.5 MG TABS Take 1 tablet by mouth daily. (Patient not taking: No sig reported) 30 tablet 11    Musculoskeletal: Strength & Muscle Tone: within normal limits Gait & Station: normal Patient leans: N/A          Psychiatric Specialty Exam:  Presentation  General Appearance: Casual; Appropriate for Environment  Eye Contact:Good  Speech:Clear and Coherent; Normal Rate  Speech Volume:Normal  Handedness:Right   Mood and Affect  Mood:Anxious; Depressed; Hopeless  Affect:Congruent; Labile   Thought Process  Thought Processes:Coherent; Goal Directed  Descriptions of Associations:Intact  Orientation:Full (Time, Place and Person)  Thought Content:Illogical (states she will use drug if discharged and cannot contract for safety)  History of Schizophrenia/Schizoaffective disorder:No  Duration of Psychotic Symptoms:N/A  Hallucinations:Hallucinations: None  Ideas of Reference:None  Suicidal Thoughts:Suicidal Thoughts: Yes, Active SI Active Intent and/or Plan: With Intent; With Plan; With Means to Carry Out  Homicidal Thoughts:Homicidal Thoughts: No   Sensorium  Memory:Immediate Good; Immediate Poor; Remote  Good  Judgment:Impaired  Insight:Fair   Executive Functions  Concentration:Good  Attention Span:Good  [place] of Knowledge:Good  Language:Good   Psychomotor Activity  Psychomotor Activity:Psychomotor Activity: Normal   Assets  Assets:Communication Skills; Desire for Improvement   Sleep  Sleep:Sleep: Good Number of Hours of Sleep: 8    Physical Exam: Physical Exam HENT:     Head: Normocephalic.  Cardiovascular:     Rate and Rhythm: Normal rate.     Pulses: Normal pulses.  Pulmonary:     Effort: Pulmonary effort is normal.  Musculoskeletal:        General: Normal range of motion.     Cervical back: Normal range of motion.  Neurological:     General: No focal deficit present.     Mental Status: She is alert and oriented to person, place, and time.  Psychiatric:        Attention and Perception: Attention and perception normal.        Mood and Affect: Mood is anxious and depressed.        Speech: Speech normal.        Behavior: Behavior normal. Behavior is cooperative.        Thought Content: Thought content includes suicidal ideation. Thought content includes suicidal plan.        Cognition and Memory: Cognition normal.        Judgment: Judgment is impulsive  and inappropriate.   Review of Systems  Constitutional: Negative.   HENT: Negative.    Eyes: Negative.   Respiratory: Negative.    Cardiovascular: Negative.  Negative for chest pain and palpitations.  Gastrointestinal: Negative.   Musculoskeletal: Negative.   Skin: Negative.   Neurological: Negative.   Endo/Heme/Allergies: Negative.   Psychiatric/Behavioral:  Positive for depression, substance abuse and suicidal ideas. Negative for hallucinations and memory loss. The patient is nervous/anxious. The patient does not have insomnia.   Blood pressure 132/75, pulse 84, temperature 98.6 F (37 C), resp. rate 18, height 4\' 11"  (1.499 m), weight 81.6 kg, last menstrual period 06/07/2020, SpO2 99  %. Body mass index is 36.36 kg/m.  Treatment Plan Summary: Daily contact with patient to assess and evaluate symptoms and progress in treatment and Medication management.  She is covid + , is asymptomatic but will continue to quarantine in the ED.  Psychiatry will continue to follow her and make medication recommendations for mood stabilization.  This plan was reviewed with the patient with concurs with recommendations.    Disposition: Recommend psychiatric Inpatient admission when medically cleared.   This service was provided via telemedicine using a 2-way, interactive audio and video technology.  Names of all persons participating in this telemedicine service and their role in this encounter. Name: Kathryn [name] Role: Patient  Name: Merlyn Lot Role: [place] Izayiah Tibbitts, NP 01/26/2021 11:47 AM

## 2021-01-26 NOTE — ED Notes (Signed)
Pt was given fruits cups and crackers at this time. Devorah Givhan

## 2021-01-26 NOTE — Progress Notes (Signed)
Pt was accepted to [facility] for tomorrow 01/27/21 anytime after 0800am. Pt will admit to [facility]. Report can be called at [phone].  Pt meets inpatient criteria per Merlyn Lot, NP  Attending Physician will be Dr. Kathlene Cote   Report can be called to: - [phone].  Pt can arrive after 0800am  CSW communicated with nursing staff via secure chat and informed of pt's bed offer for tomorrow with [facility]. CSW spoke with Celedonio Savage, RN and requested that EDP be added to the chat. EDP, Thamas Jaegers, MD add to be updated on pt's disposition.   CSW spoke with [place] from [place] and advised that CSW would fax document that pt last test positive for COVID-19 on 01/22/21 and pt is 5 day COVID-19 positive as of date.  Nadara Mode, LCSWA 01/26/2021 @ 10:45 PM

## 2021-01-27 DIAGNOSIS — U071 COVID-19: Secondary | ICD-10-CM | POA: Diagnosis not present

## 2021-01-27 MED ORDER — OXYCODONE HCL 5 MG PO TABS
5.0000 mg | ORAL_TABLET | Freq: Once | ORAL | Status: AC
  Administered 2021-01-27: 5 mg via ORAL
  Filled 2021-01-27: qty 1

## 2021-01-27 NOTE — ED Provider Notes (Signed)
Emergency Medicine Observation Re-evaluation Note  Kathryn [name] is a 50 y.o. female, seen on rounds today.  Pt initially presented to the ED for complaints of Chest Pain and Suicidal Currently, the patient is resting comfortably.  Pt has been accepted to [facility] by Dr. Kathlene Cote.  She can go today anytime after 0800.  She is out of her Covid quarantine now.  Physical Exam  BP 107/76 (BP Location: Left Arm)   Pulse 77   Temp 98.6 F (37 C)   Resp 17   Ht 4\' 11"  (1.499 m)   Wt 81.6 kg   LMP 06/07/2020   SpO2 98%   BMI 36.36 kg/m  Physical Exam   ED Course / MDM  EKG:EKG Interpretation  Date/Time:  Sunday January 23 2021 03:09:16 EDT Ventricular Rate:  111 PR Interval:  139 QRS Duration: 85 QT Interval:  340 QTC Calculation: 462 R Axis:   84 Text Interpretation: Sinus tachycardia LAE, consider biatrial enlargement Confirmed by Nanda Quinton [phone]) on 01/24/2021 3:29:15 PM  I have reviewed the labs performed to date as well as medications administered while in observation.  Recent changes in the last 24 hours include none.  Plan  Current plan is for inpatient placement today.  [facility] is not under involuntary commitment.     Kathryn Pence, MD 01/27/21 [phone]

## 2021-01-28 ENCOUNTER — Other Ambulatory Visit: Payer: Self-pay | Admitting: Neurosurgery

## 2021-01-28 DIAGNOSIS — M48062 Spinal stenosis, lumbar region with neurogenic claudication: Secondary | ICD-10-CM

## 2021-01-31 ENCOUNTER — Encounter (HOSPITAL_COMMUNITY): Payer: 59

## 2021-01-31 DIAGNOSIS — R079 Chest pain, unspecified: Secondary | ICD-10-CM | POA: Diagnosis not present

## 2021-01-31 DIAGNOSIS — R609 Edema, unspecified: Secondary | ICD-10-CM | POA: Diagnosis not present

## 2021-01-31 DIAGNOSIS — R0789 Other chest pain: Secondary | ICD-10-CM | POA: Diagnosis not present

## 2021-01-31 DIAGNOSIS — R059 Cough, unspecified: Secondary | ICD-10-CM | POA: Diagnosis not present

## 2021-02-02 ENCOUNTER — Telehealth: Payer: Self-pay

## 2021-02-02 DIAGNOSIS — R0602 Shortness of breath: Secondary | ICD-10-CM | POA: Diagnosis not present

## 2021-02-02 DIAGNOSIS — R079 Chest pain, unspecified: Secondary | ICD-10-CM | POA: Diagnosis not present

## 2021-02-02 NOTE — Telephone Encounter (Signed)
Transition Care Management Unsuccessful Follow-up Telephone Call  Date of discharge and from where:  02/01/2021-[facility]  Attempts:  1st Attempt  Reason for unsuccessful TCM follow-up call:  Left voice message

## 2021-02-03 ENCOUNTER — Telehealth: Payer: Self-pay

## 2021-02-03 ENCOUNTER — Other Ambulatory Visit: Payer: Self-pay | Admitting: Internal Medicine

## 2021-02-03 NOTE — Telephone Encounter (Addendum)
Transition Care Management Follow-up Telephone Call Date of discharge and from where: 02/01/2021 from [facility] How have you been since you were released from the hospital? Pt stated that she was feeling okay. She stated that she is still hurting 6/10 pain scale. Pt stated that the ED did not send in a referral for Vein and Vascular to eval and treat the DVT they found.  Any questions or concerns? Yes Pt stated she does not have a referral for a Vein and Vascular provider to treat the DVT that the ED found.   Patient would like to see:   [facility]: Quincy Carnes MD Address: [address] [place], [place] [address] Phone: [phone]  Items Reviewed: Did the pt receive and understand the discharge instructions provided? Yes  Medications obtained and verified? Yes  Other? No  Any new allergies since your discharge? No  Dietary orders reviewed? No Do you have support at home? Yes   Functional Questionnaire: (I = Independent and D = Dependent) ADLs: I  Bathing/Dressing- I  Meal Prep- I  Eating- I  Maintaining continence- I  Transferring/Ambulation- I  Managing Meds- I   Follow up appointments reviewed:  PCP Hospital f/u appt confirmed? No  Pt informed that PCP may need an appt for the requested referral.  [facility] f/u appt confirmed? No   Are transportation arrangements needed? No  If their condition worsens, is the pt aware to call PCP or go to the Emergency Dept.? Yes Was the patient provided with contact information for the PCP's office or ED? Yes Was to pt encouraged to call back with questions or concerns? Yes

## 2021-02-03 NOTE — Telephone Encounter (Signed)
Called patient back and lvm to call office

## 2021-02-03 NOTE — Telephone Encounter (Signed)
Patient called needs a referral to vein specialist in [place]. Patient call back # [phone].

## 2021-02-03 NOTE — Telephone Encounter (Signed)
I have not called patient that im aware of

## 2021-02-03 NOTE — Telephone Encounter (Signed)
Patient returning call from 10/8 she was a rehab in [facility] reason she could not answer her phone. Please return her call # [phone].

## 2021-02-04 ENCOUNTER — Other Ambulatory Visit: Payer: Self-pay | Admitting: *Deleted

## 2021-02-04 DIAGNOSIS — I82409 Acute embolism and thrombosis of unspecified deep veins of unspecified lower extremity: Secondary | ICD-10-CM

## 2021-02-04 NOTE — Telephone Encounter (Signed)
LVM for pt letting her know referral to hematology had been sent

## 2021-02-04 NOTE — Telephone Encounter (Signed)
Left message with daughter Jiles Crocker to have pt call us back if she still has concerns

## 2021-02-11 ENCOUNTER — Ambulatory Visit: Admit: 2021-02-11 | Payer: 59 | Admitting: Orthopedic Surgery

## 2021-02-11 SURGERY — ARTHROPLASTY, KNEE, TOTAL
Anesthesia: Choice | Site: Knee | Laterality: Right

## 2021-02-14 ENCOUNTER — Ambulatory Visit (HOSPITAL_COMMUNITY): Payer: 59 | Attending: Internal Medicine | Admitting: Physical Therapy

## 2021-02-16 ENCOUNTER — Ambulatory Visit (HOSPITAL_COMMUNITY): Payer: 59 | Attending: Internal Medicine | Admitting: Physical Therapy

## 2021-02-16 ENCOUNTER — Telehealth (HOSPITAL_COMMUNITY): Payer: Self-pay | Admitting: Physical Therapy

## 2021-02-16 NOTE — Telephone Encounter (Signed)
Called patient about missed appointment. Patient reports the surgery she was to have therapy for has been postponed and does not require therapy at this time. Future appointments cancelled, patient will have new referral sent when she is able to have the surgery.    2:20 PM, 02/16/21 Josue Hector PT DPT  Physical Therapist with [facility]  [phone]

## 2021-02-18 ENCOUNTER — Encounter (HOSPITAL_COMMUNITY): Payer: 59

## 2021-02-21 ENCOUNTER — Encounter (HOSPITAL_COMMUNITY): Payer: 59

## 2021-02-21 ENCOUNTER — Observation Stay (HOSPITAL_COMMUNITY)
Admission: EM | Admit: 2021-02-21 | Discharge: 2021-02-22 | Disposition: A | Discharge status: Home / Self Care | Payer: 59 | Attending: Internal Medicine | Admitting: Internal Medicine

## 2021-02-21 ENCOUNTER — Emergency Department (HOSPITAL_COMMUNITY): Payer: 59

## 2021-02-21 ENCOUNTER — Other Ambulatory Visit: Payer: Self-pay

## 2021-02-21 ENCOUNTER — Encounter (HOSPITAL_COMMUNITY): Payer: Self-pay | Admitting: Emergency Medicine

## 2021-02-21 DIAGNOSIS — R0789 Other chest pain: Secondary | ICD-10-CM | POA: Diagnosis not present

## 2021-02-21 DIAGNOSIS — Z79899 Other long term (current) drug therapy: Secondary | ICD-10-CM | POA: Diagnosis not present

## 2021-02-21 DIAGNOSIS — J45909 Unspecified asthma, uncomplicated: Secondary | ICD-10-CM | POA: Diagnosis not present

## 2021-02-21 DIAGNOSIS — G894 Chronic pain syndrome: Secondary | ICD-10-CM

## 2021-02-21 DIAGNOSIS — J449 Chronic obstructive pulmonary disease, unspecified: Secondary | ICD-10-CM | POA: Insufficient documentation

## 2021-02-21 DIAGNOSIS — J4540 Moderate persistent asthma, uncomplicated: Secondary | ICD-10-CM | POA: Diagnosis present

## 2021-02-21 DIAGNOSIS — E041 Nontoxic single thyroid nodule: Secondary | ICD-10-CM | POA: Diagnosis present

## 2021-02-21 DIAGNOSIS — R079 Chest pain, unspecified: Secondary | ICD-10-CM | POA: Diagnosis not present

## 2021-02-21 DIAGNOSIS — F332 Major depressive disorder, recurrent severe without psychotic features: Secondary | ICD-10-CM | POA: Diagnosis present

## 2021-02-21 DIAGNOSIS — F419 Anxiety disorder, unspecified: Secondary | ICD-10-CM

## 2021-02-21 DIAGNOSIS — F1494 Cocaine use, unspecified with cocaine-induced mood disorder: Secondary | ICD-10-CM | POA: Diagnosis not present

## 2021-02-21 DIAGNOSIS — Z86718 Personal history of other venous thrombosis and embolism: Secondary | ICD-10-CM

## 2021-02-21 DIAGNOSIS — Z86711 Personal history of pulmonary embolism: Secondary | ICD-10-CM | POA: Diagnosis present

## 2021-02-21 DIAGNOSIS — Z87891 Personal history of nicotine dependence: Secondary | ICD-10-CM | POA: Diagnosis not present

## 2021-02-21 DIAGNOSIS — Z20822 Contact with and (suspected) exposure to covid-19: Secondary | ICD-10-CM | POA: Diagnosis not present

## 2021-02-21 DIAGNOSIS — R0602 Shortness of breath: Secondary | ICD-10-CM | POA: Diagnosis present

## 2021-02-21 DIAGNOSIS — I10 Essential (primary) hypertension: Secondary | ICD-10-CM | POA: Diagnosis present

## 2021-02-21 DIAGNOSIS — G8929 Other chronic pain: Secondary | ICD-10-CM | POA: Diagnosis present

## 2021-02-21 DIAGNOSIS — R600 Localized edema: Secondary | ICD-10-CM

## 2021-02-21 DIAGNOSIS — J4530 Mild persistent asthma, uncomplicated: Secondary | ICD-10-CM

## 2021-02-21 LAB — RAPID URINE DRUG SCREEN, HOSP PERFORMED
Amphetamines: NOT DETECTED
Barbiturates: NOT DETECTED
Benzodiazepines: NOT DETECTED
Cocaine: POSITIVE — AB
Opiates: NOT DETECTED
Tetrahydrocannabinol: NOT DETECTED

## 2021-02-21 LAB — BASIC METABOLIC PANEL
Anion gap: 9 (ref 5–15)
BUN: 8 mg/dL (ref 6–20)
CO2: 21 mmol/L — ABNORMAL LOW (ref 22–32)
Calcium: 8.8 mg/dL — ABNORMAL LOW (ref 8.9–10.3)
Chloride: 110 mmol/L (ref 98–111)
Creatinine, Ser: 0.86 mg/dL (ref 0.44–1.00)
GFR, Estimated: >60 mL/min (ref >60)
Glucose, Bld: 103 mg/dL — ABNORMAL HIGH (ref 70–99)
Potassium: 4.3 mmol/L (ref 3.5–5.1)
Sodium: 140 mmol/L (ref 135–145)

## 2021-02-21 LAB — TROPONIN I (HIGH SENSITIVITY)
Troponin I (High Sensitivity): 11 ng/L (ref <18)
Troponin I (High Sensitivity): 39 ng/L — ABNORMAL HIGH (ref <18)

## 2021-02-21 LAB — URINALYSIS, COMPLETE (UACMP) WITH MICROSCOPIC
Bacteria, UA: NONE SEEN
Bilirubin Urine: NEGATIVE
Glucose, UA: NEGATIVE mg/dL
Hgb urine dipstick: NEGATIVE
Ketones, ur: NEGATIVE mg/dL
Leukocytes,Ua: NEGATIVE
Nitrite: NEGATIVE
Protein, ur: NEGATIVE mg/dL
Specific Gravity, Urine: >1.046 — ABNORMAL HIGH (ref 1.005–1.030)
pH: 5 (ref 5.0–8.0)

## 2021-02-21 LAB — RESP PANEL BY RT-PCR (FLU A&B, COVID) ARPGX2
Influenza A by PCR: NEGATIVE
Influenza B by PCR: NEGATIVE
SARS Coronavirus 2 by RT PCR: NEGATIVE

## 2021-02-21 LAB — PROTIME-INR
INR: 1 (ref 0.8–1.2)
Prothrombin Time: 12.8 seconds (ref 11.4–15.2)

## 2021-02-21 LAB — BRAIN NATRIURETIC PEPTIDE: B Natriuretic Peptide: 32.2 pg/mL (ref 0.0–100.0)

## 2021-02-21 MED ORDER — MOMETASONE FURO-FORMOTEROL FUM 100-5 MCG/ACT IN AERO
2.0000 | INHALATION_SPRAY | Freq: Two times a day (BID) | RESPIRATORY_TRACT | Status: DC
  Administered 2021-02-22: 2 via RESPIRATORY_TRACT
  Filled 2021-02-21: qty 8.8

## 2021-02-21 MED ORDER — CLONIDINE HCL 0.1 MG PO TABS
0.1000 mg | ORAL_TABLET | Freq: Every day | ORAL | Status: DC
  Administered 2021-02-22: 0.1 mg via ORAL
  Filled 2021-02-21: qty 1

## 2021-02-21 MED ORDER — ASPIRIN 325 MG PO TABS
325.0000 mg | ORAL_TABLET | Freq: Once | ORAL | Status: AC
  Administered 2021-02-21: 325 mg via ORAL
  Filled 2021-02-21: qty 1

## 2021-02-21 MED ORDER — DULOXETINE HCL 30 MG PO CPEP
30.0000 mg | ORAL_CAPSULE | Freq: Once | ORAL | Status: AC
  Administered 2021-02-21: 30 mg via ORAL
  Filled 2021-02-21: qty 1

## 2021-02-21 MED ORDER — ONDANSETRON HCL 4 MG/2ML IJ SOLN: 4.0000 mg | Freq: Four times a day (QID) | INTRAMUSCULAR | Status: DC | PRN

## 2021-02-21 MED ORDER — CELECOXIB 200 MG PO CAPS
200.0000 mg | ORAL_CAPSULE | Freq: Once | ORAL | Status: AC
  Administered 2021-02-21: 200 mg via ORAL
  Filled 2021-02-21: qty 1

## 2021-02-21 MED ORDER — ALBUTEROL SULFATE (2.5 MG/3ML) 0.083% IN NEBU
5.0000 mg | INHALATION_SOLUTION | Freq: Once | RESPIRATORY_TRACT | Status: AC
  Administered 2021-02-21: 5 mg via RESPIRATORY_TRACT
  Filled 2021-02-21: qty 6

## 2021-02-21 MED ORDER — IPRATROPIUM BROMIDE 0.02 % IN SOLN
0.5000 mg | Freq: Once | RESPIRATORY_TRACT | Status: AC
  Administered 2021-02-21: 0.5 mg via RESPIRATORY_TRACT
  Filled 2021-02-21: qty 2.5

## 2021-02-21 MED ORDER — ALUM & MAG HYDROXIDE-SIMETH 200-200-20 MG/5ML PO SUSP: 30.0000 mL | ORAL | Status: DC | PRN

## 2021-02-21 MED ORDER — HYDROXYZINE HCL 25 MG PO TABS: 50.0000 mg | ORAL_TABLET | Freq: Every day | ORAL | Status: DC | PRN

## 2021-02-21 MED ORDER — AMLODIPINE BESYLATE 5 MG PO TABS
5.0000 mg | ORAL_TABLET | Freq: Every day | ORAL | Status: DC
  Administered 2021-02-22: 5 mg via ORAL
  Filled 2021-02-21: qty 1

## 2021-02-21 MED ORDER — OXYCODONE HCL 5 MG PO TABS
5.0000 mg | ORAL_TABLET | Freq: Once | ORAL | Status: AC
  Administered 2021-02-21: 5 mg via ORAL
  Filled 2021-02-21: qty 1

## 2021-02-21 MED ORDER — OXYCODONE HCL 5 MG PO TABS
5.0000 mg | ORAL_TABLET | Freq: Four times a day (QID) | ORAL | Status: DC | PRN
  Administered 2021-02-22 (×2): 5 mg via ORAL
  Filled 2021-02-21 (×2): qty 1

## 2021-02-21 MED ORDER — TIZANIDINE HCL 4 MG PO TABS
2.0000 mg | ORAL_TABLET | Freq: Once | ORAL | Status: AC
  Administered 2021-02-21: 2 mg via ORAL
  Filled 2021-02-21: qty 1

## 2021-02-21 MED ORDER — PREGABALIN 25 MG PO CAPS: 75.0000 mg | ORAL_CAPSULE | Freq: Once | ORAL | Status: DC

## 2021-02-21 MED ORDER — PANTOPRAZOLE SODIUM 40 MG PO TBEC
40.0000 mg | DELAYED_RELEASE_TABLET | Freq: Every day | ORAL | Status: DC
  Administered 2021-02-22: 40 mg via ORAL
  Filled 2021-02-21: qty 1

## 2021-02-21 MED ORDER — NITROGLYCERIN 0.4 MG SL SUBL: 0.4000 mg | SUBLINGUAL_TABLET | SUBLINGUAL | Status: DC | PRN

## 2021-02-21 MED ORDER — PREGABALIN 25 MG PO CAPS
75.0000 mg | ORAL_CAPSULE | Freq: Once | ORAL | Status: AC
  Administered 2021-02-21: 75 mg via ORAL
  Filled 2021-02-21: qty 3

## 2021-02-21 MED ORDER — ONDANSETRON HCL 4 MG/2ML IJ SOLN
4.0000 mg | Freq: Once | INTRAMUSCULAR | Status: AC
  Administered 2021-02-21: 4 mg via INTRAVENOUS
  Filled 2021-02-21: qty 2

## 2021-02-21 MED ORDER — IOHEXOL 350 MG/ML SOLN
80.0000 mL | Freq: Once | INTRAVENOUS | Status: AC | PRN
  Administered 2021-02-21: 80 mL via INTRAVENOUS

## 2021-02-21 MED ORDER — CITALOPRAM HYDROBROMIDE 20 MG PO TABS
20.0000 mg | ORAL_TABLET | Freq: Every day | ORAL | Status: DC
  Administered 2021-02-22: 20 mg via ORAL
  Filled 2021-02-21: qty 1

## 2021-02-21 MED ORDER — PREGABALIN 75 MG PO CAPS
75.0000 mg | ORAL_CAPSULE | Freq: Two times a day (BID) | ORAL | Status: DC
  Administered 2021-02-22: 75 mg via ORAL
  Filled 2021-02-21: qty 1

## 2021-02-21 MED ORDER — LIDOCAINE VISCOUS HCL 2 % MT SOLN
15.0000 mL | OROMUCOSAL | Status: DC | PRN
  Filled 2021-02-21: qty 15

## 2021-02-21 MED ORDER — TIZANIDINE HCL 4 MG PO TABS: 2.0000 mg | ORAL_TABLET | Freq: Once | ORAL | Status: DC

## 2021-02-21 MED ORDER — ALBUTEROL SULFATE (2.5 MG/3ML) 0.083% IN NEBU: 2.5000 mg | INHALATION_SOLUTION | RESPIRATORY_TRACT | Status: DC | PRN

## 2021-02-21 MED ORDER — FUROSEMIDE 20 MG PO TABS: 20.0000 mg | ORAL_TABLET | Freq: Every day | ORAL | Status: DC

## 2021-02-21 NOTE — ED Notes (Signed)
Lab contacted about need for blood draw

## 2021-02-21 NOTE — ED Notes (Signed)
Patient transported to CT 

## 2021-02-21 NOTE — ED Provider Notes (Addendum)
Emergency Medicine Provider Triage Evaluation Note  Kathryn [name] , a 50 y.o. female  was evaluated in triage.  Pt complains of many things, all starting on friday Bad cough, body aches-exposed to flu Leg swelling-takes lasix  Review of Systems  Positive: Low back pain, left flank pain, leg pain, sob, dizzy, pre-syncope Negative: Vomiting  Physical Exam  BP 99/64 (BP Location: Right Arm)   Pulse 89   Temp 97.9 F (36.6 C) (Oral)   Resp 14   LMP 06/07/2020   SpO2 100%  Gen:   Awake, no distress   Resp:  Normal effort  MSK:   Moves extremities without difficulty  Other:  Wheezing in all lung fields  Medical Decision Making  Medically screening exam initiated at 2:39 PM.  Appropriate orders placed.  Kathryn [name] was informed that the remainder of the evaluation will be completed by another provider, this initial triage assessment does not replace that evaluation, and the importance of remaining in the ED until their evaluation is complete.  On eliquis, for dvt dx last month, but been out of it for 3 days. CTA ordered.    Kathryn Hammock, PA-C 02/21/21 1450    Kathryn Hammock, PA-C 02/21/21 1452    Kathryn Lower, MD 02/21/21 1528

## 2021-02-21 NOTE — H&P (Addendum)
History and Physical    Kathryn [name] DOB: [birth year] DOA: 02/21/2021  PCP: Lindell Spar, MD   Patient coming from: Home   Chief Complaint: Chest pain, swelling   HPI: Kathryn [name] is a pleasant 50 y.o. female with medical history significant for asthma, chronic back and knee pain, depression, anxiety, cocaine abuse, history of DVT and PE on Eliquis, and hypertension, presenting to the emergency department for evaluation of chest pain and swelling.  Patient reports that she was in the hospital visiting her granddaughter admitted with influenza when she went to use the restroom, was straining to move her bowels, and then developed cute onset of central chest pain.  She also complains of recent swelling in both arms and both legs.  The chest pain has been constant, localized to the central chest, worse with certain movements and deep breath, and similar to pain she had last month for which she was evaluated by cardiology in [place].  She reports being out of her Eliquis and Lasix for the past 3 days.  Denies hemoptysis, fevers, or chills.  ED Course: Upon arrival to the ED, patient is found to be afebrile, saturating well on room air, and with stable blood pressure.  EKG features sinus rhythm.  Chest x-ray negative for acute cardiopulmonary disease.  CTA chest negative for PE but notable for mild distal esophageal wall thickening, left thyroid nodule, and diffuse bronchial wall thickening.  UDS positive for cocaine.  BNP normal and initial troponin normal but second troponin was 39.  Patient was given Lyrica, Zanaflex, Cymbalta, Celebrex, and oxycodone in the emergency department.  She was also given 324 mg of aspirin.  Review of Systems:  All other systems reviewed and apart from HPI, are negative.  Past Medical History:  Diagnosis Date   Anxiety    Arthritis    Asthma    Bipolar disorder ([place])    Chronic back pain    COPD (chronic obstructive pulmonary disease)  (HCC)    Chronic bronchitis   Depression    GERD (gastroesophageal reflux disease)    Hypertension    Neuropathy    Osteoarthritis of left knee, 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   Sleep apnea 04/2020   GETTING A cpap   Suicidal ideation 12/24/2018    Past Surgical History:  Procedure Laterality Date   BACK SURGERY     DILATATION AND CURETTAGE/HYSTEROSCOPY WITH MINERVA N/A 06/09/2020   Procedure: DILATATION AND CURETTAGE/HYSTEROSCOPY WITH MINERVA;  Surgeon: Florian Buff, MD;  Location: [facility];  Service: Gynecology;  Laterality: N/A;   ESOPHAGOGASTRODUODENOSCOPY (EGD) WITH PROPOFOL N/A 12/25/2018   Procedure: ESOPHAGOGASTRODUODENOSCOPY (EGD) WITH PROPOFOL;  Surgeon: Rogene Houston, MD;  Location: [facility];  Service: Endoscopy;  Laterality: N/A;   PATELLA-FEMORAL ARTHROPLASTY Left 10/08/2018   Procedure: PATELLA-FEMORAL ARTHROPLASTY;  Surgeon: Marchia Bond, MD;  Location: [facility];  Service: Orthopedics;  Laterality: Left;   TUBAL LIGATION      Social History:   reports that she quit smoking about 6 years ago. Her smoking use included cigarettes. She has never used smokeless tobacco. She reports that she does not currently use alcohol. She reports that she does not currently use drugs after having used the following drugs: Cocaine.  Allergies  Allergen Reactions   Clonopin [Clonazepam] Anaphylaxis    Swelling of the tongue and mouth.    Fish Allergy Anaphylaxis, Shortness Of Breath and Swelling   Flexeril [Cyclobenzaprine Hcl] Shortness Of Breath   Ibuprofen Anaphylaxis, Hives and Other (See  Comments)   Shellfish Allergy Anaphylaxis   Tylenol [Acetaminophen] Anaphylaxis   Ace Inhibitors Cough   Cyclobenzaprine    Tramadol Nausea And Vomiting    Upset stomach   Trazodone And Nefazodone Hives    Family History  Problem Relation Age of Onset   Gout Paternal Grandfather    Cirrhosis Paternal Grandfather    Hypertension Paternal Grandmother    Aneurysm Paternal Grandmother     Cirrhosis Maternal Grandmother    Cirrhosis Maternal Grandfather    Cancer Father    Cirrhosis Father    Cirrhosis Mother    Breast cancer Sister    Hypertension Sister    Bronchitis Daughter    Bronchitis Daughter    Asthma Son    Bronchitis Son    Migraines Neg Hx      Prior to Admission medications   Medication Sig Start Date End Date Taking? Authorizing Provider  albuterol (PROVENTIL) (2.5 MG/3ML) 0.083% nebulizer solution Take 2.5 mg by nebulization every 6 (six) hours as needed for wheezing or shortness of breath.   Yes [provider]  amLODipine (NORVASC) 5 MG tablet TAKE 1 TABLET BY MOUTH ONCE A DAY FOR BLOOD PRESSURE. Patient taking differently: Take 5 mg by mouth daily. 10/26/20  Yes Lindell Spar, MD  budesonide-formoterol [facility]) 80-4.5 MCG/ACT inhaler Inhale 2 puffs into the lungs 2 (two) times daily. 03/19/20  Yes Patel, Colin Broach, MD  Camphor-Menthol-Methyl Sal (SALONPAS) 3.04-22-08 % PTCH Apply 1 patch topically as needed (pain).   Yes [provider]  celecoxib (CELEBREX) 200 MG capsule Take 1 capsule (200 mg total) by mouth daily. 11/23/20  Yes Lindell Spar, MD  Cholecalciferol (VITAMIN D) 50 MCG (2000 UT) CAPS Take 1 capsule by mouth daily.   Yes [provider]  citalopram (CELEXA) 20 MG tablet TAKE ONE TABLET BY MOUTH ONCE DAILY. Patient taking differently: Take 20 mg by mouth daily. 12/27/20  Yes Lindell Spar, MD  cloNIDine (CATAPRES) 0.1 MG tablet TAKE 1 TABLET BY MOUTH ONCE DAILY FOR BLOOD PRESSURE AND ANXIETY. Patient taking differently: Take 0.1 mg by mouth daily. 01/13/21  Yes Lindell Spar, MD  Cyanocobalamin (VITAMIN B 12 PO) Take 1 tablet by mouth daily.   Yes [provider]  DULoxetine (CYMBALTA) 30 MG capsule Take 30 mg by mouth 2 (two) times daily. 11/26/20  Yes [provider]  furosemide (LASIX) 20 MG tablet TAKE ONE TABLET BY MOUTH ONCE DAILY. Patient taking differently: Take 20 mg by mouth daily.  12/13/20  Yes Lindell Spar, MD  hydrocortisone (ANUSOL-HC) 2.5 % rectal cream Place 1 application rectally 2 (two) times daily. 11/18/20  Yes Lindell Spar, MD  hydrOXYzine (VISTARIL) 50 MG capsule Take 1 capsule (50 mg total) by mouth daily as needed for anxiety (Insomnia). 11/18/20  Yes Lindell Spar, MD  oxyCODONE (OXY IR/ROXICODONE) 5 MG immediate release tablet Take 5 mg by mouth every 4 (four) hours as needed for severe pain.   Yes [provider]  potassium chloride SA (KLOR-CON) 20 MEQ tablet Take 1 tablet (20 mEq total) by mouth daily. 10/12/20  Yes Fayrene Helper, MD  pregabalin (LYRICA) 75 MG capsule Take 1 capsule (75 mg total) by mouth 2 (two) times daily. 01/17/21  Yes Patel, Colin Broach, MD  tiZANidine (ZANAFLEX) 2 MG tablet TAKE 1 TABLET BY MOUTH TWICE DAILY AS NEEDED FOR MUSCLE SPASMS Patient taking differently: Take 2 mg by mouth 2 (two) times daily as needed for muscle spasms.  02/04/21  Yes Lindell Spar, MD  albuterol (PROVENTIL HFA;VENTOLIN HFA) 108 (90 Base) MCG/ACT inhaler Inhale 2 puffs into the lungs every 6 (six) hours as needed for wheezing or shortness of breath. Patient not taking: Reported on 02/21/2021    [provider]  methylPREDNISolone (MEDROL DOSEPAK) 4 MG TBPK tablet Day 1: 8 mg PO before breakfast, 4 mg after lunch and after dinner, and 8 mg at bedtime  Day 2: 4 mg PO before breakfast, after lunch, and after dinner and 8 mg at bedtime  Day 3: 4 mg PO before breakfast, after lunch, after dinner, and at bedtime  Day 4: 4 mg PO before breakfast, after lunch, and at bedtime  Day 5: 4 mg PO before breakfast and at bedtime  Day 6: 4 mg PO before breakfast Patient not taking: No sig reported 12/17/20   Varney Biles, MD  omeprazole (PRILOSEC) 20 MG capsule TAKE 1 CAPSULE BY MOUTH ONCE A DAY. Patient not taking: Reported on 01/23/2021 12/13/20   Lindell Spar, MD  oxyCODONE-acetaminophen (PERCOCET/ROXICET) 5-325 MG tablet Take 1 tablet by  mouth every 6 (six) hours as needed for severe pain. Patient not taking: Reported on 01/23/2021 12/16/20   Varney Biles, MD  predniSONE (STERAPRED UNI-PAK 21 TAB) 10 MG (21) TBPK tablet Take by mouth daily. Take 6 tabs by mouth daily  for 2 days, then 5 tabs for 2 days, then 4 tabs for 2 days, then 3 tabs for 2 days, 2 tabs for 2 days, then 1 tab by mouth daily for 2 days Patient not taking: Reported on 02/21/2021 01/17/21   Garald Balding, PA-C  Relugolix-Estradiol-Norethind (MYFEMBREE) 40-1-0.5 MG TABS Take 1 tablet by mouth daily. Patient not taking: No sig reported 08/06/20   Florian Buff, MD    Physical Exam: Vitals:   02/21/21 2000 02/21/21 2106 02/21/21 2300 02/21/21 2312  BP: 107/74 100/66 101/67   Pulse: 84 87 71   Resp: 15 17 (!) 22   Temp:    98.2 F (36.8 C)  TempSrc:    Oral  SpO2: 99% 100% 99%     Constitutional: NAD, calm  Eyes: PERTLA, lids and conjunctivae normal ENMT: Mucous membranes are moist. Posterior pharynx clear of any exudate or lesions.   Neck: supple, no masses  Respiratory: no wheezing, no crackles. No accessory muscle use.  Cardiovascular: S1 & S2 heard, regular rate and rhythm. No significant JVD. Abdomen: No distension, no tenderness, soft. Bowel sounds active.  Musculoskeletal: no clubbing / cyanosis. Tender sternum. No joint deformity upper and lower extremities.   Skin: no significant rashes, lesions, ulcers. Warm, dry, well-perfused. Neurologic: CN 2-12 grossly intact. Moving all extremities. Alert and oriented.  Psychiatric: Calm. Cooperative.    Labs and Imaging on Admission: I have personally reviewed following labs and imaging studies  CBC: No results for input(s): WBC, NEUTROABS, HGB, HCT, MCV, PLT in the last 168 hours. Basic Metabolic Panel: Recent Labs  Lab 02/21/21 1434  NA 140  K 4.3  CL 110  CO2 21*  GLUCOSE 103*  BUN 8  CREATININE 0.86  CALCIUM 8.8*   GFR: CrCl cannot be calculated (Unknown ideal weight.). Liver  Function Tests: No results for input(s): AST, ALT, ALKPHOS, BILITOT, PROT, ALBUMIN in the last 168 hours. No results for input(s): LIPASE, AMYLASE in the last 168 hours. No results for input(s): AMMONIA in the last 168 hours. Coagulation Profile: Recent Labs  Lab 02/21/21 1659  INR 1.0   Cardiac Enzymes: No  results for input(s): CKTOTAL, CKMB, CKMBINDEX, TROPONINI in the last 168 hours. BNP (last 3 results) No results for input(s): PROBNP in the last 8760 hours. HbA1C: No results for input(s): HGBA1C in the last 72 hours. CBG: No results for input(s): GLUCAP in the last 168 hours. Lipid Profile: No results for input(s): CHOL, HDL, LDLCALC, TRIG, CHOLHDL, LDLDIRECT in the last 72 hours. Thyroid Function Tests: No results for input(s): TSH, T4TOTAL, FREET4, T3FREE, THYROIDAB in the last 72 hours. Anemia Panel: No results for input(s): VITAMINB12, FOLATE, FERRITIN, TIBC, IRON, RETICCTPCT in the last 72 hours. Urine analysis:    Component Value Date/Time   COLORURINE YELLOW 02/21/2021 2030   APPEARANCEUR CLEAR 02/21/2021 2030   LABSPEC >1.046 (H) 02/21/2021 2030   [place] 5.0 02/21/2021 2030   GLUCOSEU NEGATIVE 02/21/2021 2030   [place] NEGATIVE 02/21/2021 2030   [place] NEGATIVE 02/21/2021 2030   [place] 02/21/2021 2030   PROTEINUR NEGATIVE 02/21/2021 2030   UROBILINOGEN 4.0 (H) 08/05/2014 1415   NITRITE NEGATIVE 02/21/2021 2030   LEUKOCYTESUR NEGATIVE 02/21/2021 2030   Sepsis Labs: @LABRCNTIP (procalcitonin:4,lacticidven:4) ) Recent Results (from the past 240 hour(s))  Resp Panel by RT-PCR (Flu A&B, Covid) Nasopharyngeal Swab     Status: None   Collection Time: 02/21/21  3:02 PM   Specimen: Nasopharyngeal Swab; Nasopharyngeal(NP) swabs in vial transport medium  Result Value Ref Range Status   SARS Coronavirus 2 by RT PCR NEGATIVE NEGATIVE Final    Comment: (NOTE) SARS-CoV-2 target nucleic acids are NOT DETECTED.  The SARS-CoV-2 RNA is generally detectable  in upper respiratory specimens during the acute phase of infection. The lowest concentration of SARS-CoV-2 viral copies this assay can detect is 138 copies/mL. A negative result does not preclude SARS-Cov-2 infection and should not be used as the sole basis for treatment or other patient management decisions. A negative result may occur with  improper specimen collection/handling, submission of specimen other than nasopharyngeal swab, presence of viral mutation(s) within the areas targeted by this assay, and inadequate number of viral copies(<138 copies/mL). A negative result must be combined with clinical observations, patient history, and epidemiological information. The expected result is Negative.  Fact Sheet for Patients:  EntrepreneurPulse.com.au  Fact Sheet for Healthcare Providers:  IncredibleEmployment.be  This test is no t yet approved or cleared by the [place] FDA and  has been authorized for detection and/or diagnosis of SARS-CoV-2 by FDA under an Emergency Use Authorization (EUA). This EUA will remain  in effect (meaning this test can be used) for the duration of the COVID-19 declaration under Section 564(b)(1) of the Act, 21 U.S.C.section 360bbb-3(b)(1), unless the authorization is terminated  or revoked sooner.       Influenza A by PCR NEGATIVE NEGATIVE Final   Influenza B by PCR NEGATIVE NEGATIVE Final    Comment: (NOTE) The Xpert Xpress SARS-CoV-2/FLU/RSV plus assay is intended as an aid in the diagnosis of influenza from Nasopharyngeal swab specimens and should not be used as a sole basis for treatment. Nasal washings and aspirates are unacceptable for Xpert Xpress SARS-CoV-2/FLU/RSV testing.  Fact Sheet for Patients: EntrepreneurPulse.com.au  Fact Sheet for Healthcare Providers: IncredibleEmployment.be  This test is not yet approved or cleared by the [place] FDA and has  been authorized for detection and/or diagnosis of SARS-CoV-2 by FDA under an Emergency Use Authorization (EUA). This EUA will remain in effect (meaning this test can be used) for the duration of the COVID-19 declaration under Section 564(b)(1) of the Act, 21 U.S.C. section 360bbb-3(b)(1), unless  the authorization is terminated or revoked.  Performed at [facility], [place] [address], [place], [place] [address]      Radiological Exams on Admission: CT Angio Chest PE W and/or Wo Contrast  Result Date: 02/21/2021 CLINICAL DATA:  Left flank pain, productive cough and bilateral leg swelling with shortness of breath. EXAM: CT ANGIOGRAPHY CHEST WITH CONTRAST TECHNIQUE: Multidetector CT imaging of the chest was performed using the standard protocol during bolus administration of intravenous contrast. Multiplanar CT image reconstructions and MIPs were obtained to evaluate the vascular anatomy. CONTRAST:  29mL OMNIPAQUE IOHEXOL 350 MG/ML SOLN COMPARISON:  Chest CT January 23, 2021 and CT PE Sep 11, 2020. FINDINGS: Cardiovascular: Satisfactory opacification of the pulmonary arteries to the segmental level. No evidence of pulmonary embolism. No thoracic aortic aneurysm or dissection. Normal heart size. No significant pericardial effusion/thickening. Mediastinum/Nodes: Hypoenhancing 1.5 cm left thyroid nodule. No pathologically enlarged mediastinal, hilar or axillary lymph nodes. Mild symmetric distal esophageal wall thickening. Lungs/Pleura: Mild diffuse bronchial wall thickening with mosaic attenuation of the lungs. No focal airspace consolidation. No pleural effusion. No pneumothorax. Upper Abdomen: No acute abnormality. Musculoskeletal: Mild thoracic spondylosis. No acute osseous abnormality. Review of the MIP images confirms the above findings. IMPRESSION: 1. No acute pulmonary embolus. 2. Mild diffuse bronchial wall thickening with mosaic attenuation of the lungs, which may represent small airways  disease. 3. Mild symmetric distal esophageal wall thickening, which may represent esophagitis. 4. Hypoenhancing 1.5 cm left thyroid nodule. Recommend further characterization with nonemergent thyroid US. Reference: J Am Coll Radiol. 2015 Feb;12(2): 143-50 Electronically Signed   By: Dahlia Bailiff M.D.   On: 02/21/2021 17:28   DG Chest Portable 1 View  Result Date: 02/21/2021 CLINICAL DATA:  Shortness of breath EXAM: PORTABLE CHEST 1 VIEW COMPARISON:  CT January 23, 2021 and radiograph November 12, 2020. FINDINGS: The heart size and mediastinal contours are within normal limits. Both lungs are clear. The visualized skeletal structures are unremarkable. IMPRESSION: No active disease. Electronically Signed   By: Dahlia Bailiff M.D.   On: 02/21/2021 15:23    EKG: Independently reviewed. Sinus rhythm.   Assessment/Plan   1. Chest pain, elevated troponin  - Presents with acute-onset central chest pain  - No PE on CTA chest, no ischemic features on EKG, initial troponin 11 and second 39  - Exam consistent with costochondritis and there is question of esophagitis raised on CT but difficult to explain the delta troponin  - She was given ASA 325 mg in ED and hospitalists asked to admit for cardiology consult  - She has been out of Eliquis for 3 days  - Plan to continue cardiac monitoring, trend troponin, start PPI and GI cocktail, consult cardiology in am, and start IV heparin for now rather than resuming her Eliquis pending cardiology eval   ADDENDUM: 3rd troponin is 3. Second troponin was likely spurious. Plan to resume Eliquis and treat costochondritis. She was already planning to get outpatient echocardiogram with her cardiologist in [place].    2. Hx of DVT and PE  - Hx of b/l LE DVT and PE in May 2022 after back surgery, nonocclusive RLE DVT noted on [name] in October 2022  - She reports being out of Eliquis for 3 days, will use IV heparin for now pending cardiology evaluation   3. Hypertension   - BP at goal, continue clonidine and Norvasc    4. Asthma  - No cough or wheezing on admission   - Continue ICS/LABA and  prn SABA    5. Depression, anxiety  - Continue Celexa and as-needed Vistaril    6. Chronic pain  - Chronic back and knee pain stable  - Database reviewed, continue home medications   7. Substance abuse  - Tox screen consistently positive for cocaine  - Cessation encouraged    8. Thyroid nodule  - Noted incidentally on CT in ED  - Outpatient [name] recommended   9. Esophageal wall-thickening  - Noted on CT in ED, possibly esophagitis  - She has been taking NSAIDs and not taking her PPI  - Stop NSAIDs, resume PPI    DVT prophylaxis: IV heparin  Code Status: Full  Level of Care: Level of care: Telemetry Cardiac Family Communication: none present  Disposition Plan:  Patient is from: Home  Anticipated d/c is to: Home Anticipated d/c date is: 11/8 or 02/23/21  Patient currently: Pending repeat troponin and EKG, cardiology consult  Consults called: none  Admission status: Observation     Vianne Bulls, MD [facility]  02/21/2021, 11:41 PM

## 2021-02-21 NOTE — ED Triage Notes (Signed)
Patient presents with multiple complaints: Left flank pain , productive cough and bilateral legs swelling onset last week .

## 2021-02-21 NOTE — ED Provider Notes (Signed)
[facility] EMERGENCY DEPARTMENT Provider Note   CSN: [id number] Arrival date & time: 02/21/21  4098     History Chief Complaint  Patient presents with   Flank Pain / Cough / Leg Swelling    Kathryn [name] is a 50 y.o. female.  HPI  50 year old female with chronic PMH notable for chronic pain, COPD not on home oxygen, asthma, depression, HTN, peripheral neuropathy, OSA, obesity, and others as below who presents to the ED with multiple complaints.  Patient reports that for the past 2 days, she has had worsening shortness of breath, myalgias, dysuria, lower extremity edema and pain, nausea and vomiting, cough, and acute on chronic pain.  She has been using her inhaler more frequently.  She was exposed to a family member with influenza over the past few days, with subsequent development of symptoms.  Of note, she was diagnosed with a DVT in May 2022, with a repeat negative ultrasound in September.  Patient went to OSH for repeat pain and was found to have a partially occlusive thrombus.  She was previously compliant on her Eliquis, but has not taken it in the past 3 days.  She denies any measured fevers.  She reports COVID-19 and influenza vaccinations up-to-date.  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   Encounter for examination following treatment at hospital 01/17/2021   Preoperative  examination 01/17/2021   Bilateral leg edema 10/18/2020   Morbid obesity ([place]) 10/18/2020   S/P lumbar fusion 09/14/2020   History of DVT of lower extremity 09/14/2020   Spondylolisthesis at L4-L5 level 03/29/2020   Insomnia 03/19/2020   Mild persistent asthma without complication 11/91/4782   Need for immunization against influenza 03/19/2020   Body mass index (BMI) 38.0-38.9, adult 03/19/2020   GERD (gastroesophageal reflux disease) 03/19/2020   Intermittent palpitations 12/17/2019   Snoring 12/17/2019   Excessive daytime sleepiness 12/17/2019   Nightmare disorder, 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   Enlarged uterus 02/20/2018   Screening for colorectal cancer 02/20/2018   OA (osteoarthritis) of knee 12/27/2017   MDD (major depressive disorder), recurrent episode, severe ([place]) 12/24/2017   Lumbar spinal stenosis 05/11/2017   Low vitamin B12 level 05/11/2017   Hypertension 01/11/2017    Past Surgical History:  Procedure Laterality Date   BACK SURGERY     DILATATION AND CURETTAGE/HYSTEROSCOPY WITH MINERVA N/A 06/09/2020   Procedure: DILATATION AND CURETTAGE/HYSTEROSCOPY WITH MINERVA;  Surgeon: Florian Buff, MD;  Location: [facility]S;  Service: Gynecology;  Laterality: N/A;   ESOPHAGOGASTRODUODENOSCOPY (EGD) WITH PROPOFOL N/A 12/25/2018   Procedure: ESOPHAGOGASTRODUODENOSCOPY (EGD) WITH PROPOFOL;  Surgeon: Rogene Houston, MD;  Location: [facility] END[facility] SUITE;  Service: Endoscopy;  Laterality: N/A;   PATELLA-FEMORAL ARTHROPLASTY Left 10/08/2018   Procedure: PATELLA-FEMORAL ARTHROPLASTY;  Surgeon: Marchia Bond, MD;  Location: [facility];  Service: Orthopedics;  Laterality: Left;   TUBAL LIGATION       OB History     Gravida  5   Para  3   Term  2   Preterm  1   AB  2   Living  3      SAB  1   IAB  1   Ectopic      Multiple      Live Births              Family History  Problem Relation Age of Onset   Gout Paternal Grandfather    Cirrhosis Paternal Grandfather     Hypertension Paternal Grandmother    Aneurysm Paternal Grandmother    Cirrhosis Maternal Grandmother    Cirrhosis Maternal Grandfather    Cancer Father    Cirrhosis Father    Cirrhosis Mother    Breast cancer Sister    Hypertension Sister    Bronchitis Daughter    Bronchitis Daughter    Asthma Son    Bronchitis Son    Migraines Neg Hx     Social History   Tobacco Use   Smoking status: Former    Types: Cigarettes    Quit date: 2016    Years since quitting: 6.8   Smokeless tobacco: Never   Tobacco comments:    wears nicotine patches  Vaping Use   Vaping Use: Never used  Substance Use Topics   Alcohol use: Not Currently   Drug use: Not Currently    Types: Cocaine    Comment: crack  last used 2016    Home Medications Prior to Admission medications   Medication Sig Start Date End Date Taking? Authorizing Provider  albuterol (PROVENTIL) (2.5 MG/3ML) 0.083% nebulizer solution Take 2.5 mg by nebulization every 6 (six) hours as needed for wheezing or shortness of breath.   Yes [provider]  amLODipine (NORVASC) 5 MG tablet TAKE 1 TABLET BY MOUTH ONCE A DAY FOR BLOOD PRESSURE. Patient taking differently: Take 5 mg by mouth daily. 10/26/20  Yes Lindell Spar, MD  budesonide-formoterol [facility]) 80-4.5 MCG/ACT inhaler Inhale 2 puffs into the lungs 2 (two) times daily. 03/19/20  Yes Patel, Colin Broach, MD  Camphor-Menthol-Methyl Sal (SALONPAS) 3.04-22-08 % PTCH Apply 1 patch topically as needed (pain).   Yes [provider]  celecoxib (CELEBREX) 200 MG capsule Take 1 capsule (200 mg total) by mouth daily. 11/23/20  Yes Lindell Spar, MD  Cholecalciferol (VITAMIN D) 50 MCG (2000 UT) CAPS Take 1 capsule by mouth daily.   Yes [provider]  citalopram (CELEXA) 20 MG tablet TAKE ONE TABLET BY MOUTH ONCE DAILY. Patient taking differently: Take 20 mg by mouth daily. 12/27/20  Yes Lindell Spar, MD  cloNIDine (CATAPRES) 0.1 MG tablet TAKE 1 TABLET BY MOUTH  ONCE DAILY FOR BLOOD PRESSURE AND ANXIETY. Patient taking differently: Take 0.1 mg by mouth daily. 01/13/21  Yes Lindell Spar, MD  Cyanocobalamin (VITAMIN B 12 PO) Take 1 tablet by mouth daily.   Yes [provider]  DULoxetine (CYMBALTA) 30 MG capsule Take 30 mg by mouth 2 (two) times daily. 11/26/20  Yes [provider]  furosemide (LASIX) 20 MG tablet TAKE ONE TABLET BY MOUTH ONCE DAILY. Patient taking differently: Take 20 mg by mouth daily. 12/13/20  Yes Lindell Spar, MD  hydrocortisone (ANUSOL-HC) 2.5 % rectal cream Place 1 application rectally 2 (two) times daily. 11/18/20  Yes Lindell Spar, MD  hydrOXYzine (VISTARIL) 50 MG capsule Take 1 capsule (50 mg total) by mouth daily as needed for anxiety (Insomnia). 11/18/20  Yes Lindell Spar, MD  oxyCODONE (OXY IR/ROXICODONE) 5 MG immediate release tablet Take 5 mg by mouth every 4 (four) hours as needed for severe pain.   Yes [provider]  potassium chloride SA (KLOR-CON) 20 MEQ tablet Take 1 tablet (20 mEq total) by mouth daily. 10/12/20  Yes Fayrene Helper, MD  pregabalin (LYRICA) 75 MG capsule Take 1 capsule (75 mg total) by mouth 2 (two) times daily. 01/17/21  Yes Patel, Colin Broach, MD  tiZANidine (ZANAFLEX) 2 MG tablet TAKE 1 TABLET BY MOUTH TWICE DAILY AS NEEDED FOR MUSCLE SPASMS Patient taking differently: Take 2 mg by mouth 2 (two) times daily as needed for muscle spasms. 02/04/21  Yes Lindell Spar, MD  albuterol (PROVENTIL HFA;VENTOLIN HFA) 108 (90 Base) MCG/ACT inhaler Inhale 2 puffs into the lungs every 6 (six) hours as needed for wheezing or shortness of breath. Patient not taking: Reported on 02/21/2021    [provider]  methylPREDNISolone (MEDROL DOSEPAK) 4 MG TBPK tablet Day 1: 8 mg PO before breakfast, 4 mg after lunch and after dinner, and 8 mg at bedtime  Day 2: 4 mg PO before breakfast, after lunch, and after dinner and 8 mg at bedtime  Day 3: 4 mg PO before breakfast, after  lunch, after dinner, and at bedtime  Day 4: 4 mg PO before breakfast, after lunch, and at bedtime  Day 5: 4 mg PO before breakfast and at bedtime  Day 6: 4 mg PO before breakfast Patient not taking: No sig reported 12/17/20   Varney Biles, MD  omeprazole (PRILOSEC) 20 MG capsule TAKE 1 CAPSULE BY MOUTH ONCE A DAY. Patient not taking: Reported on 01/23/2021 12/13/20   Lindell Spar, MD  oxyCODONE-acetaminophen (PERCOCET/ROXICET) 5-325 MG tablet Take 1 tablet by mouth every 6 (six) hours as needed for severe pain. Patient not taking: Reported on 01/23/2021 12/16/20   Varney Biles, MD  predniSONE (STERAPRED UNI-PAK 21 TAB) 10 MG (21) TBPK tablet Take by mouth daily. Take 6 tabs by mouth daily  for 2 days, then 5 tabs for 2 days, then 4 tabs for 2 days, then 3 tabs for 2 days, 2 tabs for 2 days, then 1 tab by mouth daily for 2 days Patient not taking: Reported on 02/21/2021 01/17/21   Garald Balding, PA-C  Relugolix-Estradiol-Norethind (MYFEMBREE) 40-1-0.5 MG TABS Take 1 tablet by mouth daily. Patient not taking: No sig reported 08/06/20   Florian Buff, MD    Allergies    Clonopin [clonazepam], Fish allergy, Flexeril [cyclobenzaprine hcl], Ibuprofen, Shellfish allergy, Tylenol [acetaminophen], Ace inhibitors, Cyclobenzaprine, Tramadol, and Trazodone and nefazodone  Review of Systems   Review of Systems  Constitutional:  Positive for chills and fatigue. Negative for activity change, appetite change and fever.  HENT:  Positive for congestion and rhinorrhea. Negative for ear pain and sore throat.   Eyes:  Negative for photophobia and visual disturbance.  Respiratory:  Positive for cough and shortness of breath. Negative for wheezing and stridor.   Cardiovascular:  Positive for chest pain and leg swelling. Negative for palpitations.  Gastrointestinal:  Positive for abdominal pain, nausea and vomiting. Negative for blood in stool, constipation and diarrhea.  Genitourinary:  Positive for dysuria.  Negative for hematuria.  Musculoskeletal:  Negative for arthralgias, back pain, neck pain and neck stiffness.  Skin:  Negative for color change, pallor  and rash.  Neurological:  Positive for headaches. Negative for dizziness, tremors, seizures, syncope, weakness, light-headedness and numbness.  Hematological:  Bruises/bleeds easily.  Psychiatric/Behavioral:  Negative for confusion. The patient is nervous/anxious.   All other systems reviewed and are negative.  Physical Exam Updated Vital Signs BP 101/67   Pulse 71   Temp 98.2 F (36.8 C) (Oral)   Resp (!) 22   LMP 06/07/2020   SpO2 99%   Physical Exam Vitals and nursing note reviewed.  Constitutional:      General: She is not in acute distress.    Appearance: Normal appearance. She is well-developed. She is obese. She is not ill-appearing, toxic-appearing or diaphoretic.  HENT:     Head: Normocephalic and atraumatic.     Right Ear: External ear normal.     Left Ear: External ear normal.     Nose: Nose normal.     Mouth/Throat:     Mouth: Mucous membranes are moist.     Pharynx: Oropharynx is clear.  Eyes:     General: No scleral icterus.    Extraocular Movements: Extraocular movements intact.     Conjunctiva/sclera: Conjunctivae normal.     Pupils: Pupils are equal, round, and reactive to light.  Cardiovascular:     Rate and Rhythm: Normal rate and regular rhythm.     Pulses: Normal pulses.     Heart sounds: Normal heart sounds. No murmur heard. Pulmonary:     Effort: Pulmonary effort is normal. No tachypnea, bradypnea or respiratory distress.     Breath sounds: Normal breath sounds and air entry.  Chest:     Chest wall: No tenderness or crepitus.  Abdominal:     General: There is no distension.     Palpations: Abdomen is soft.     Tenderness: There is no abdominal tenderness. There is no guarding or rebound.  Musculoskeletal:        General: Normal range of motion.     Cervical back: Normal range of motion and neck  supple. No rigidity or tenderness.     Right lower leg: No edema.     Left lower leg: No edema.  Lymphadenopathy:     Cervical: No cervical adenopathy.  Skin:    General: Skin is warm and dry.     Capillary Refill: Capillary refill takes less than 2 seconds.  Neurological:     General: No focal deficit present.     Mental Status: She is alert and oriented to person, place, and time. Mental status is at baseline.     GCS: GCS eye subscore is 4. GCS verbal subscore is 5. GCS motor subscore is 6.  Psychiatric:        Mood and Affect: Mood normal.        Behavior: Behavior normal.    ED Results / Procedures / Treatments   Labs (all labs ordered are listed, but only abnormal results are displayed) Labs Reviewed  BASIC METABOLIC PANEL - Abnormal; Notable for the following components:      Result Value   CO2 21 (*)    Glucose, Bld 103 (*)    Calcium 8.8 (*)    All other components within normal limits  URINALYSIS, COMPLETE (UACMP) WITH MICROSCOPIC - Abnormal; Notable for the following components:   Specific Gravity, Urine >1.046 (*)    All other components within normal limits  TROPONIN I (HIGH SENSITIVITY) - Abnormal; Notable for the following components:   Troponin I (High Sensitivity) 39 (*)  All other components within normal limits  RESP PANEL BY RT-PCR (FLU A&B, COVID) ARPGX2  BRAIN NATRIURETIC PEPTIDE  PROTIME-INR  CBC WITH DIFFERENTIAL/PLATELET  CBC  RAPID URINE DRUG SCREEN, HOSP PERFORMED  TROPONIN I (HIGH SENSITIVITY)  TROPONIN I (HIGH SENSITIVITY)  TROPONIN I (HIGH SENSITIVITY)    EKG EKG Interpretation  Date/Time:  Monday February 21 2021 15:13:29 EST Ventricular Rate:  88 PR Interval:  130 QRS Duration: 84 QT Interval:  388 QTC Calculation: 470 R Axis:   71 Text Interpretation: Sinus rhythm Confirmed by Lajean Saver [id number]) on 02/21/2021 3:22:48 PM  Radiology CT Angio Chest PE W and/or Wo Contrast  Result Date: 02/21/2021 CLINICAL DATA:  Left flank  pain, productive cough and bilateral leg swelling with shortness of breath. EXAM: CT ANGIOGRAPHY CHEST WITH CONTRAST TECHNIQUE: Multidetector CT imaging of the chest was performed using the standard protocol during bolus administration of intravenous contrast. Multiplanar CT image reconstructions and MIPs were obtained to evaluate the vascular anatomy. CONTRAST:  32mL OMNIPAQUE IOHEXOL 350 MG/ML SOLN COMPARISON:  Chest CT January 23, 2021 and CT PE Sep 11, 2020. FINDINGS: Cardiovascular: Satisfactory opacification of the pulmonary arteries to the segmental level. No evidence of pulmonary embolism. No thoracic aortic aneurysm or dissection. Normal heart size. No significant pericardial effusion/thickening. Mediastinum/Nodes: Hypoenhancing 1.5 cm left thyroid nodule. No pathologically enlarged mediastinal, hilar or axillary lymph nodes. Mild symmetric distal esophageal wall thickening. Lungs/Pleura: Mild diffuse bronchial wall thickening with mosaic attenuation of the lungs. No focal airspace consolidation. No pleural effusion. No pneumothorax. Upper Abdomen: No acute abnormality. Musculoskeletal: Mild thoracic spondylosis. No acute osseous abnormality. Review of the MIP images confirms the above findings. IMPRESSION: 1. No acute pulmonary embolus. 2. Mild diffuse bronchial wall thickening with mosaic attenuation of the lungs, which may represent small airways disease. 3. Mild symmetric distal esophageal wall thickening, which may represent esophagitis. 4. Hypoenhancing 1.5 cm left thyroid nodule. Recommend further characterization with nonemergent thyroid US. Reference: J Am Coll Radiol. 2015 Feb;12(2): 143-50 Electronically Signed   By: Dahlia Bailiff M.D.   On: 02/21/2021 17:28   DG Chest Portable 1 View  Result Date: 02/21/2021 CLINICAL DATA:  Shortness of breath EXAM: PORTABLE CHEST 1 VIEW COMPARISON:  CT January 23, 2021 and radiograph November 12, 2020. FINDINGS: The heart size and mediastinal contours are within  normal limits. Both lungs are clear. The visualized skeletal structures are unremarkable. IMPRESSION: No active disease. Electronically Signed   By: Dahlia Bailiff M.D.   On: 02/21/2021 15:23    Procedures Procedures   Medications Ordered in ED Medications  nitroGLYCERIN (NITROSTAT) SL tablet 0.4 mg (has no administration in time range)  oxyCODONE (Oxy IR/ROXICODONE) immediate release tablet 5 mg (5 mg Oral Given 02/21/21 1649)  ondansetron (ZOFRAN) injection 4 mg (4 mg Intravenous Given 02/21/21 1650)  iohexol (OMNIPAQUE) 350 MG/ML injection 80 mL (80 mLs Intravenous Contrast Given 02/21/21 1718)  albuterol (PROVENTIL) (2.5 MG/3ML) 0.083% nebulizer solution 5 mg (5 mg Nebulization Given 02/21/21 1817)  ipratropium (ATROVENT) nebulizer solution 0.5 mg (0.5 mg Nebulization Given 02/21/21 1817)  celecoxib (CELEBREX) capsule 200 mg (200 mg Oral Given 02/21/21 2034)  DULoxetine (CYMBALTA) DR capsule 30 mg (30 mg Oral Given 02/21/21 2029)  pregabalin (LYRICA) capsule 75 mg (75 mg Oral Given 02/21/21 2029)  tiZANidine (ZANAFLEX) tablet 2 mg (2 mg Oral Given 02/21/21 2030)  aspirin tablet 325 mg (325 mg Oral Given 02/21/21 2319)    ED Course  I have reviewed the triage vital signs and the  nursing notes.  Pertinent labs & imaging results that were available during my care of the patient were reviewed by me and considered in my medical decision making (see chart for details).    MDM Rules/Calculators/A&P HEAR Score: 3                        Kathryn [name] is a 50 y.o. female presenting with flank pain, cough, leg swelling. Initial VS sig for tachypnea. 100% on RA.  EKG interpretation: Normal sinus rhythm, normal intervals, no ST elevations or depressions.  Normal EKG..  Labs: BMP unremarkable.  UA concentrated, otherwise WNL.  COVID/flu negative.  BNP 32.  CBC ordered and pending, unclear delay in lab.  Troponin of 11, repeat 39  Imaging: No acute cardiopulmonary abnormality on CXR.  CT negative for  PE.  Discussed nonemergent thyroid nodule with patient and need for outpatient follow-up.  Imaging was reviewed by radiology and personally by me.  DDX considered: PE, DVT, pneumothorax, CAP, influenza, COVID-19, COPD exacerbation, CHF exacerbation, gastroenteritis, ACS.  History, examination, and objective data most consistent with chest pain with uptrending troponin.  Low suspicion for NSTEMI or ACS with unremarkable EKG, but given HEAR score and persistence of chest pain with delta troponin of 28, patient requires admission for further work-up via HEART pathway.  No other etiology of chest pain, diffuse pain, shortness of breath identified.  Acute on chronic pain appears to be a factor in her presentation.  No systemic infectious signs or symptoms, viral work-up negative.  Nonhypoxic on room air and clear lungs bilaterally.  XR without focal opacity.  Medications: Medications  nitroGLYCERIN (NITROSTAT) SL tablet 0.4 mg (has no administration in time range)  oxyCODONE (Oxy IR/ROXICODONE) immediate release tablet 5 mg (5 mg Oral Given 02/21/21 1649)  ondansetron (ZOFRAN) injection 4 mg (4 mg Intravenous Given 02/21/21 1650)  iohexol (OMNIPAQUE) 350 MG/ML injection 80 mL (80 mLs Intravenous Contrast Given 02/21/21 1718)  albuterol (PROVENTIL) (2.5 MG/3ML) 0.083% nebulizer solution 5 mg (5 mg Nebulization Given 02/21/21 1817)  ipratropium (ATROVENT) nebulizer solution 0.5 mg (0.5 mg Nebulization Given 02/21/21 1817)  celecoxib (CELEBREX) capsule 200 mg (200 mg Oral Given 02/21/21 2034)  DULoxetine (CYMBALTA) DR capsule 30 mg (30 mg Oral Given 02/21/21 2029)  pregabalin (LYRICA) capsule 75 mg (75 mg Oral Given 02/21/21 2029)  tiZANidine (ZANAFLEX) tablet 2 mg (2 mg Oral Given 02/21/21 2030)  aspirin tablet 325 mg (325 mg Oral Given 02/21/21 2319)    Re-evaluated after home pain medicines administered.  Patient states pain is improved but is still mildly present.  Sleeping on repeat exam. Hemodynamically stable  and in no acute distress.  Given asa and nitro. Will not initiate heparin at this time given systemic anticoagulation with Eliquis, and low concern for NSTEMI during ED evaluation.  Admitted to hospitalist in stable condition.  Nonemergent cardiology consult recommended per HEART pathway. Patient understands and agrees with the plan. Transferred from the ED without issue.    The plan for this patient was discussed with my attending physician, who voiced agreement and who oversaw evaluation and treatment of this patient.     Note: Estate manager/land agent was used in the creation of this note.  Final Clinical Impression(s) / ED Diagnoses Final diagnoses:  Chest pain, unspecified type    Rx / DC Orders ED Discharge Orders     None        Cherly Hensen, DO 02/21/21 2332  Lajean Saver, MD 02/22/21 [phone]

## 2021-02-22 ENCOUNTER — Ambulatory Visit (HOSPITAL_COMMUNITY): Payer: 59 | Admitting: Hematology

## 2021-02-22 DIAGNOSIS — F1494 Cocaine use, unspecified with cocaine-induced mood disorder: Secondary | ICD-10-CM | POA: Diagnosis not present

## 2021-02-22 DIAGNOSIS — G894 Chronic pain syndrome: Secondary | ICD-10-CM | POA: Diagnosis not present

## 2021-02-22 DIAGNOSIS — F419 Anxiety disorder, unspecified: Secondary | ICD-10-CM | POA: Diagnosis not present

## 2021-02-22 DIAGNOSIS — R079 Chest pain, unspecified: Secondary | ICD-10-CM | POA: Diagnosis not present

## 2021-02-22 LAB — CBC
HCT: 31.7 % — ABNORMAL LOW (ref 36.0–46.0)
Hemoglobin: 10.4 g/dL — ABNORMAL LOW (ref 12.0–15.0)
MCH: 28 pg (ref 26.0–34.0)
MCHC: 32.8 g/dL (ref 30.0–36.0)
MCV: 85.2 fL (ref 80.0–100.0)
Platelets: 559 10*3/uL — ABNORMAL HIGH (ref 150–400)
RBC: 3.72 MIL/uL — ABNORMAL LOW (ref 3.87–5.11)
RDW: 16.5 % — ABNORMAL HIGH (ref 11.5–15.5)
WBC: 7.3 10*3/uL (ref 4.0–10.5)
nRBC: 0 % (ref 0.0–0.2)

## 2021-02-22 LAB — HIV ANTIBODY (ROUTINE TESTING W REFLEX): HIV Screen 4th Generation wRfx: NONREACTIVE

## 2021-02-22 LAB — TROPONIN I (HIGH SENSITIVITY)
Troponin I (High Sensitivity): 3 ng/L (ref <18)
Troponin I (High Sensitivity): 4 ng/L (ref <18)

## 2021-02-22 MED ORDER — POTASSIUM CHLORIDE CRYS ER 20 MEQ PO TBCR: 20.0000 meq | EXTENDED_RELEASE_TABLET | Freq: Every day | ORAL | 0 refills | Status: DC

## 2021-02-22 MED ORDER — APIXABAN 5 MG PO TABS: 5.0000 mg | ORAL_TABLET | Freq: Two times a day (BID) | ORAL | 0 refills | Status: DC

## 2021-02-22 MED ORDER — APIXABAN 5 MG PO TABS: 5.0000 mg | ORAL_TABLET | Freq: Two times a day (BID) | ORAL | Status: DC

## 2021-02-22 MED ORDER — FUROSEMIDE 20 MG PO TABS: 20.0000 mg | ORAL_TABLET | Freq: Every day | ORAL | 0 refills | Status: DC

## 2021-02-22 MED ORDER — INFLUENZA VAC SPLIT QUAD 0.5 ML IM SUSY: 0.5000 mL | PREFILLED_SYRINGE | INTRAMUSCULAR | Status: DC

## 2021-02-22 MED ORDER — APIXABAN 5 MG PO TABS
5.0000 mg | ORAL_TABLET | Freq: Two times a day (BID) | ORAL | Status: DC
  Administered 2021-02-22 (×2): 5 mg via ORAL
  Filled 2021-02-22 (×2): qty 1

## 2021-02-22 NOTE — Progress Notes (Signed)
   02/22/21 1325  Clinical Encounter Type  Visited With Patient  Visit Type Initial  Referral From Nurse  Consult/Referral To [place] responded to the patient's request for an Advance Directive. The patient was asleep. Chaplain Owens Shark informed the Financial controller, who stated she would ensure the patient's attending nurse gave the patient the form. Advised the patient can call if have questions. Chaplain Owens Shark is available to provide A.D. education. This note was prepared by Jeanine Luz, M.Div..  For questions please contact by phone [phone].

## 2021-02-22 NOTE — Care Management (Signed)
02-22-21 1130 Case Manager received a consult for medication assistance. Patient has [employer]. Unable to assist with medications for patients with insurance. Case Manager will continue to follow for additional needs.

## 2021-02-22 NOTE — Social Work (Signed)
CSW was contacted by Floor RN in regards to Pt lacking transportation home for d/c. TOC team arranged transportation via [employer].

## 2021-02-22 NOTE — Discharge Summary (Signed)
Physician Discharge Summary  Kathryn [name] ION:[id number] DOB: 07-01-1970 DOA: 02/21/2021  PCP: Lindell Spar, MD  Admit date: 02/21/2021 Discharge date: 02/22/2021  Admitted From: home  Disposition: home   Recommendations for Outpatient Follow-up and new medication changes:  Follow up with Dr Posey Pronto in 7 to 10 days Follow-up with thyroid ultrasound as an outpatient, 1.5 cm left thyroid nodule hypo-enhancing per CT scan  Home Health: no   Equipment/Devices: no    Discharge Condition: stable  CODE STATUS: full  Diet recommendation:  heart healthy   Brief/Interim Summary: Kathryn [name] was admitted to the hospital with the working diagnosis of atypical chest pain.   50 year old female past medical history for asthma, chronic back pain, chronic knee pain, depression, anxiety, cocaine abuse, history of DVT and PE, and hypertension who presented with chest pain.  She developed acute chest pain while moving her bowels apparently she was straining.  She did mention recent edema in both arms and both legs.  Because of persistent symptoms she came to the hospital.  Her chest pain was central, worse with deep breath and certain movements.  On her initial physical examination blood pressure 107/74, heart rate 84, respiratory rate 15, oxygen saturation 99%, her lungs are clear to auscultation bilaterally, heart S1-S2, present, rhythmic, soft abdomen, no lower extremity edema.  Sodium 140, potassium 4.3, chloride 110, bicarb 21, glucose 103, BUN 8, creatinine 0.86.  High sensitive troponin 11-39-3-4 White count 11.9, hemoglobin 12.2, hematocrit 37.2, platelets 670. SARS COVID-19 negative.  Urinalysis specific gravity > 1.046. Drug screen positive for cocaine.  EKG 88 bpm, normal axis, normal intervals, sinus rhythm, no significant ST segment or T wave changes.  CT chest with no acute pulmonary embolism.  Hypoenhancing 1.5 cm left thyroid nodule.  Atypical chest pain, musculoskeletal.  Patient rule  out for acute coronary syndrome. Her pain is likely musculoskeletal, it has improved.  2.  History of DVT and PE.  She had a left lower extremity DVT and PE in May 2022, nonocclusive right lower extremity DVT on ultrasound in October 22. Apparently she has been out of apixaban for 3 days, a new prescription has been sent to her pharmacy. Repeat CT chest this time was negative for pulm embolism.  3.  Hypertension.  Continue blood pressure control with clonidine and amlodipine.  4.  Asthma.  No acute exacerbation continue inhaled corticosteroids and bronchodilators.  5.  Depression and anxiety.  Continue Celexa and as needed Vistaril.  6.  Incidental thyroid nodule.  Follow-up as an outpatient with ultrasonography.  7.  Chronic pain syndrome, substance abuse, continue pain control with oral analgesics. She has been advised to avoid cocaine.  8.  Possible esophagitis.  Encouraged to resume taking proton pump inhibitors.  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    Fish Allergy Anaphylaxis, Shortness Of Breath, Swelling   Flexeril [cyclobenzaprine Hcl] Shortness Of Breath   Ibuprofen Anaphylaxis, Hives, Other (See Comments)   Shellfish Allergy Anaphylaxis  Tylenol [acetaminophen] Anaphylaxis   Ace Inhibitors Cough   Cyclobenzaprine    Tramadol Nausea And Vomiting   Upset stomach   Trazodone And Nefazodone Hives        Medication List     STOP taking these medications    methylPREDNISolone 4 MG  Tbpk tablet Commonly known as: MEDROL DOSEPAK   Myfembree 40-1-0.5 MG Tabs Generic drug: Relugolix-Estradiol-Norethind   omeprazole 20 MG capsule Commonly known as: PRILOSEC   oxyCODONE-acetaminophen 5-325 MG tablet Commonly known as: PERCOCET/ROXICET   predniSONE 10 MG (21) Tbpk tablet Commonly known as: STERAPRED UNI-PAK 21 TAB       TAKE these medications    albuterol (2.5 MG/3ML) 0.083% nebulizer solution Commonly known as: PROVENTIL Take 2.5 mg by nebulization every 6 (six) hours as needed for wheezing or shortness of breath.   amLODipine 5 MG tablet Commonly known as: NORVASC TAKE 1 TABLET BY MOUTH ONCE A DAY FOR BLOOD PRESSURE. What changed: additional instructions   apixaban 5 MG Tabs tablet Commonly known as: ELIQUIS Take 1 tablet (5 mg total) by mouth 2 (two) times daily.   budesonide-formoterol 80-4.5 MCG/ACT inhaler Commonly known as: Symbicort Inhale 2 puffs into the lungs 2 (two) times daily.   celecoxib 200 MG capsule Commonly known as: CELEBREX Take 1 capsule (200 mg total) by mouth daily.   citalopram 20 MG tablet Commonly known as: CELEXA TAKE ONE TABLET BY MOUTH ONCE DAILY.   cloNIDine 0.1 MG tablet Commonly known as: CATAPRES TAKE 1 TABLET BY MOUTH ONCE DAILY FOR BLOOD PRESSURE AND ANXIETY. What changed: See the new instructions.   DULoxetine 30 MG capsule Commonly known as: CYMBALTA Take 30 mg by mouth 2 (two) times daily.   furosemide 20 MG tablet Commonly known as: LASIX Take 1 tablet (20 mg total) by mouth daily.   hydrocortisone 2.5 % rectal cream Commonly known as: Anusol-HC Place 1 application rectally 2 (two) times daily.   hydrOXYzine 50 MG capsule Commonly known as: VISTARIL Take 1 capsule (50 mg total) by mouth daily as needed for anxiety (Insomnia).   oxyCODONE 5 MG immediate release tablet Commonly known as: Oxy IR/ROXICODONE Take 5 mg by mouth every 4 (four) hours as needed for severe pain.   potassium chloride SA  20 MEQ tablet Commonly known as: KLOR-CON Take 1 tablet (20 mEq total) by mouth daily.   pregabalin 75 MG capsule Commonly known as: LYRICA Take 1 capsule (75 mg total) by mouth 2 (two) times daily.   Salonpas 3.04-22-08 % Ptch Generic drug: Camphor-Menthol-Methyl Sal Apply 1 patch topically as needed (pain).   tiZANidine 2 MG tablet Commonly known as: ZANAFLEX TAKE 1 TABLET BY MOUTH TWICE DAILY AS NEEDED FOR MUSCLE SPASMS   VITAMIN B 12 PO Take 1 tablet by mouth daily.   Vitamin D 50 MCG (2000 UT) Caps Take 1 capsule by mouth daily.        Allergies  Allergen Reactions   Clonopin [Clonazepam] Anaphylaxis    Swelling of the tongue and mouth.    Fish Allergy Anaphylaxis, Shortness Of Breath and Swelling   Flexeril [Cyclobenzaprine Hcl] Shortness Of Breath   Ibuprofen Anaphylaxis, Hives and Other (See Comments)   Shellfish Allergy Anaphylaxis   Tylenol [Acetaminophen] Anaphylaxis   Ace Inhibitors Cough   Cyclobenzaprine    Tramadol Nausea And Vomiting    Upset stomach   Trazodone And Nefazodone Hives      Procedures/Studies: CT Angio Chest PE W and/or Wo Contrast  Result Date: 02/21/2021 CLINICAL  DATA:  Left flank pain, productive cough and bilateral leg swelling with shortness of breath. EXAM: CT ANGIOGRAPHY CHEST WITH CONTRAST TECHNIQUE: Multidetector CT imaging of the chest was performed using the standard protocol during bolus administration of intravenous contrast. Multiplanar CT image reconstructions and MIPs were obtained to evaluate the vascular anatomy. CONTRAST:  61mL OMNIPAQUE IOHEXOL 350 MG/ML SOLN COMPARISON:  Chest CT January 23, 2021 and CT PE Sep 11, 2020. FINDINGS: Cardiovascular: Satisfactory opacification of the pulmonary arteries to the segmental level. No evidence of pulmonary embolism. No thoracic aortic aneurysm or dissection. Normal heart size. No significant pericardial effusion/thickening. Mediastinum/Nodes: Hypoenhancing 1.5 cm left thyroid nodule.  No pathologically enlarged mediastinal, hilar or axillary lymph nodes. Mild symmetric distal esophageal wall thickening. Lungs/Pleura: Mild diffuse bronchial wall thickening with mosaic attenuation of the lungs. No focal airspace consolidation. No pleural effusion. No pneumothorax. Upper Abdomen: No acute abnormality. Musculoskeletal: Mild thoracic spondylosis. No acute osseous abnormality. Review of the MIP images confirms the above findings. IMPRESSION: 1. No acute pulmonary embolus. 2. Mild diffuse bronchial wall thickening with mosaic attenuation of the lungs, which may represent small airways disease. 3. Mild symmetric distal esophageal wall thickening, which may represent esophagitis. 4. Hypoenhancing 1.5 cm left thyroid nodule. Recommend further characterization with nonemergent thyroid US. Reference: J Am Coll Radiol. 2015 Feb;12(2): 143-50 Electronically Signed   By: Dahlia Bailiff M.D.   On: 02/21/2021 17:28   DG Chest Portable 1 View  Result Date: 02/21/2021 CLINICAL DATA:  Shortness of breath EXAM: PORTABLE CHEST 1 VIEW COMPARISON:  CT January 23, 2021 and radiograph November 12, 2020. FINDINGS: The heart size and mediastinal contours are within normal limits. Both lungs are clear. The visualized skeletal structures are unremarkable. IMPRESSION: No active disease. Electronically Signed   By: Dahlia Bailiff M.D.   On: 02/21/2021 15:23       Subjective: Patient is feeling better, continue to have chest pain but improved in intensity. No nausea or vomiting.  Discharge Exam: Vitals:   02/22/21 0850 02/22/21 1201  BP:  107/82  Pulse:  76  Resp:  18  Temp:  98.1 F (36.7 C)  SpO2: 100% 97%   Vitals:   02/22/21 0509 02/22/21 0806 02/22/21 0850 02/22/21 1201  BP: 115/88 123/76  107/82  Pulse: 86 79  76  Resp: 19 18  18   Temp: (!) 97.5 F (36.4 C) (!) 97.4 F (36.3 C)  98.1 F (36.7 C)  TempSrc: Oral Oral  Oral  SpO2: 100% 100% 100% 97%  Height:        General: Not in pain or  dyspnea  Neurology: Awake and alert, non focal  E ENT: no pallor, no icterus, oral mucosa moist Cardiovascular: No JVD. S1-S2 present, rhythmic, no gallops, rubs, or murmurs. trace lower extremity edema. Pulmonary: positive breath sounds bilaterally, adequate air movement, no wheezing, rhonchi or rales. Gastrointestinal. Abdomen soft and non tender Skin. No rashes Musculoskeletal: no joint deformities   The results of significant diagnostics from this hospitalization (including imaging, microbiology, ancillary and laboratory) are listed below for reference.     Microbiology: Recent Results (from the past 240 hour(s))  Resp Panel by RT-PCR (Flu A&B, Covid) Nasopharyngeal Swab     Status: None   Collection Time: 02/21/21  3:02 PM   Specimen: Nasopharyngeal Swab; Nasopharyngeal(NP) swabs in vial transport medium  Result Value Ref Range Status   SARS Coronavirus 2 by RT PCR NEGATIVE NEGATIVE Final    Comment: (NOTE) SARS-CoV-2 target nucleic acids are NOT  DETECTED.  The SARS-CoV-2 RNA is generally detectable in upper respiratory specimens during the acute phase of infection. The lowest concentration of SARS-CoV-2 viral copies this assay can detect is 138 copies/mL. A negative result does not preclude SARS-Cov-2 infection and should not be used as the sole basis for treatment or other patient management decisions. A negative result may occur with  improper specimen collection/handling, submission of specimen other than nasopharyngeal swab, presence of viral mutation(s) within the areas targeted by this assay, and inadequate number of viral copies(<138 copies/mL). A negative result must be combined with clinical observations, patient history, and epidemiological information. The expected result is Negative.  Fact Sheet for Patients:  EntrepreneurPulse.com.au  Fact Sheet for Healthcare Providers:  IncredibleEmployment.be  This test is no t yet  approved or cleared by the [place] FDA and  has been authorized for detection and/or diagnosis of SARS-CoV-2 by FDA under an Emergency Use Authorization (EUA). This EUA will remain  in effect (meaning this test can be used) for the duration of the COVID-19 declaration under Section 564(b)(1) of the Act, 21 U.S.C.section 360bbb-3(b)(1), unless the authorization is terminated  or revoked sooner.       Influenza A by PCR NEGATIVE NEGATIVE Final   Influenza B by PCR NEGATIVE NEGATIVE Final    Comment: (NOTE) The Xpert Xpress SARS-CoV-2/FLU/RSV plus assay is intended as an aid in the diagnosis of influenza from Nasopharyngeal swab specimens and should not be used as a sole basis for treatment. Nasal washings and aspirates are unacceptable for Xpert Xpress SARS-CoV-2/FLU/RSV testing.  Fact Sheet for Patients: EntrepreneurPulse.com.au  Fact Sheet for Healthcare Providers: IncredibleEmployment.be  This test is not yet approved or cleared by the [place] FDA and has been authorized for detection and/or diagnosis of SARS-CoV-2 by FDA under an Emergency Use Authorization (EUA). This EUA will remain in effect (meaning this test can be used) for the duration of the COVID-19 declaration under Section 564(b)(1) of the Act, 21 U.S.C. section 360bbb-3(b)(1), unless the authorization is terminated or revoked.  Performed at [facility], [place] [address] [place], [place] [address]      Labs: BNP (last 3 results) Recent Labs    02/21/21 1659  BNP 41.6   Basic Metabolic Panel: Recent Labs  Lab 02/21/21 1434  NA 140  K 4.3  CL 110  CO2 21*  GLUCOSE 103*  BUN 8  CREATININE 0.86  CALCIUM 8.8*   Liver Function Tests: No results for input(s): AST, ALT, ALKPHOS, BILITOT, PROT, ALBUMIN in the last 168 hours. No results for input(s): LIPASE, AMYLASE in the last 168 hours. No results for input(s): AMMONIA in the last 168  hours. CBC: Recent Labs  Lab 02/22/21 0118  WBC 7.3  HGB 10.4*  HCT 31.7*  MCV 85.2  PLT 559*   Cardiac Enzymes: No results for input(s): CKTOTAL, CKMB, CKMBINDEX, TROPONINI in the last 168 hours. BNP: Invalid input(s): POCBNP CBG: No results for input(s): GLUCAP in the last 168 hours. D-Dimer No results for input(s): DDIMER in the last 72 hours. Hgb A1c No results for input(s): HGBA1C in the last 72 hours. Lipid Profile No results for input(s): CHOL, HDL, LDLCALC, TRIG, CHOLHDL, LDLDIRECT in the last 72 hours. Thyroid function studies No results for input(s): TSH, T4TOTAL, T3FREE, THYROIDAB in the last 72 hours.  Invalid input(s): FREET3 Anemia work up No results for input(s): VITAMINB12, FOLATE, FERRITIN, TIBC, IRON, RETICCTPCT in the last 72 hours. Urinalysis    Component Value Date/Time  COLORURINE YELLOW 02/21/2021 2030   APPEARANCEUR CLEAR 02/21/2021 2030   LABSPEC >1.046 (H) 02/21/2021 2030   [place] 5.0 02/21/2021 2030   GLUCOSEU NEGATIVE 02/21/2021 2030   [place] NEGATIVE 02/21/2021 2030   [place] NEGATIVE 02/21/2021 2030   [place] 02/21/2021 2030   PROTEINUR NEGATIVE 02/21/2021 2030   UROBILINOGEN 4.0 (H) 08/05/2014 1415   NITRITE NEGATIVE 02/21/2021 2030   LEUKOCYTESUR NEGATIVE 02/21/2021 2030   Sepsis Labs Invalid input(s): PROCALCITONIN,  WBC,  LACTICIDVEN Microbiology Recent Results (from the past 240 hour(s))  Resp Panel by RT-PCR (Flu A&B, Covid) Nasopharyngeal Swab     Status: None   Collection Time: 02/21/21  3:02 PM   Specimen: Nasopharyngeal Swab; Nasopharyngeal(NP) swabs in vial transport medium  Result Value Ref Range Status   SARS Coronavirus 2 by RT PCR NEGATIVE NEGATIVE Final    Comment: (NOTE) SARS-CoV-2 target nucleic acids are NOT DETECTED.  The SARS-CoV-2 RNA is generally detectable in upper respiratory specimens during the acute phase of infection. The lowest concentration of SARS-CoV-2 viral copies this assay can  detect is 138 copies/mL. A negative result does not preclude SARS-Cov-2 infection and should not be used as the sole basis for treatment or other patient management decisions. A negative result may occur with  improper specimen collection/handling, submission of specimen other than nasopharyngeal swab, presence of viral mutation(s) within the areas targeted by this assay, and inadequate number of viral copies(<138 copies/mL). A negative result must be combined with clinical observations, patient history, and epidemiological information. The expected result is Negative.  Fact Sheet for Patients:  EntrepreneurPulse.com.au  Fact Sheet for Healthcare Providers:  IncredibleEmployment.be  This test is no t yet approved or cleared by the [place] FDA and  has been authorized for detection and/or diagnosis of SARS-CoV-2 by FDA under an Emergency Use Authorization (EUA). This EUA will remain  in effect (meaning this test can be used) for the duration of the COVID-19 declaration under Section 564(b)(1) of the Act, 21 U.S.C.section 360bbb-3(b)(1), unless the authorization is terminated  or revoked sooner.       Influenza A by PCR NEGATIVE NEGATIVE Final   Influenza B by PCR NEGATIVE NEGATIVE Final    Comment: (NOTE) The Xpert Xpress SARS-CoV-2/FLU/RSV plus assay is intended as an aid in the diagnosis of influenza from Nasopharyngeal swab specimens and should not be used as a sole basis for treatment. Nasal washings and aspirates are unacceptable for Xpert Xpress SARS-CoV-2/FLU/RSV testing.  Fact Sheet for Patients: EntrepreneurPulse.com.au  Fact Sheet for Healthcare Providers: IncredibleEmployment.be  This test is not yet approved or cleared by the [place] FDA and has been authorized for detection and/or diagnosis of SARS-CoV-2 by FDA under an Emergency Use Authorization (EUA). This EUA will remain in  effect (meaning this test can be used) for the duration of the COVID-19 declaration under Section 564(b)(1) of the Act, 21 U.S.C. section 360bbb-3(b)(1), unless the authorization is terminated or revoked.  Performed at [facility], [place] [address], [place], [place] [address]      Time coordinating discharge: 45 minutes  SIGNED:   Tawni Millers, MD  [facility] 02/22/2021, 1:53 PM

## 2021-02-22 NOTE — Progress Notes (Signed)
Pt safely discharged. Discharge packet provided with teach-back method. VS wnL and as per flow. IVs removed, Pt verbalized understanding. All questions and concerns addressed.

## 2021-02-22 NOTE — Progress Notes (Signed)
Pt has no ride home. Arranging an Landscape architect with the Education officer, museum.

## 2021-02-23 ENCOUNTER — Telehealth: Payer: Self-pay

## 2021-02-23 ENCOUNTER — Encounter (HOSPITAL_COMMUNITY): Payer: 59 | Admitting: Physical Therapy

## 2021-02-23 DIAGNOSIS — Z789 Other specified health status: Secondary | ICD-10-CM

## 2021-02-23 NOTE — Progress Notes (Shared)
[facility] [address], [place] [address]   CLINIC:  Medical Oncology/Hematology  CONSULT NOTE  Patient Care Team: Lindell Spar, MD as PCP - General (Internal Medicine)  CHIEF COMPLAINTS/PURPOSE OF CONSULTATION:  ***  HISTORY OF PRESENTING ILLNESS:  [name] 50 y.o. female is here because of ***, at the request of {REFERRING PHYSICIAN}.  MEDICAL HISTORY:  Past Medical History:  Diagnosis Date   Anxiety    Arthritis    Asthma    Bipolar disorder ([place])    Chronic back pain    COPD (chronic obstructive pulmonary disease) (HCC)    Chronic bronchitis   Depression    GERD (gastroesophageal reflux disease)    Hypertension    Neuropathy    Osteoarthritis of left knee, 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   Sleep apnea 04/2020   GETTING A cpap   Suicidal ideation 12/24/2018    SURGICAL HISTORY: Past Surgical History:  Procedure Laterality Date   BACK SURGERY     DILATATION AND CURETTAGE/HYSTEROSCOPY WITH MINERVA N/A 06/09/2020   Procedure: DILATATION AND CURETTAGE/HYSTEROSCOPY WITH MINERVA;  Surgeon: Florian Buff, MD;  Location: [facility];  Service: Gynecology;  Laterality: N/A;   ESOPHAGOGASTRODUODENOSCOPY (EGD) WITH PROPOFOL N/A 12/25/2018   Procedure: ESOPHAGOGASTRODUODENOSCOPY (EGD) WITH PROPOFOL;  Surgeon: Rogene Houston, MD;  Location: [facility];  Service: Endoscopy;  Laterality: N/A;   PATELLA-FEMORAL ARTHROPLASTY Left 10/08/2018   Procedure: PATELLA-FEMORAL ARTHROPLASTY;  Surgeon: Marchia Bond, MD;  Location: [facility];  Service: Orthopedics;  Laterality: Left;   TUBAL LIGATION      SOCIAL HISTORY: Social History   Socioeconomic History   Marital status: Single    Spouse name: Not on file   Number of children: Not on file   Years of education: Not on file   Highest education level: Not on file  Occupational History   Not on file  Tobacco Use   Smoking status: Former    Types: Cigarettes    Quit date: 2016    Years since  quitting: 6.8   Smokeless tobacco: Never   Tobacco comments:    wears nicotine patches  Vaping Use   Vaping Use: Never used  Substance and Sexual Activity   Alcohol use: Not Currently   Drug use: Not Currently    Types: Cocaine    Comment: crack  last used 2016   Sexual activity: Yes    Birth control/protection: Surgical    Comment: tubal  Other Topics Concern   Not on file  Social History Narrative   R handed    Lives with boyfriend   1 Cup of caffeine daily    Social Determinants of Health   Financial Resource Strain: Not on file  Food Insecurity: Not on file  Transportation Needs: Not on file  Physical Activity: Not on file  Stress: Not on file  Social Connections: Not on file  Intimate Partner Violence: Not on file    FAMILY HISTORY: Family History  Problem Relation Age of Onset   Gout Paternal Grandfather    Cirrhosis Paternal Grandfather    Hypertension Paternal Grandmother    Aneurysm Paternal Grandmother    Cirrhosis Maternal Grandmother    Cirrhosis Maternal Grandfather    Cancer Father    Cirrhosis Father    Cirrhosis Mother    Breast cancer Sister    Hypertension Sister    Bronchitis Daughter    Bronchitis Daughter    Asthma Son    Bronchitis Son  Migraines Neg Hx     ALLERGIES:  is allergic to clonopin [clonazepam], fish allergy, flexeril [cyclobenzaprine hcl], ibuprofen, shellfish allergy, tylenol [acetaminophen], ace inhibitors, cyclobenzaprine, tramadol, and trazodone and nefazodone.  MEDICATIONS:  Current Outpatient Medications  Medication Sig Dispense Refill   albuterol (PROVENTIL) (2.5 MG/3ML) 0.083% nebulizer solution Take 2.5 mg by nebulization every 6 (six) hours as needed for wheezing or shortness of breath.     amLODipine (NORVASC) 5 MG tablet TAKE 1 TABLET BY MOUTH ONCE A DAY FOR BLOOD PRESSURE. (Patient taking differently: Take 5 mg by mouth daily.) 90 tablet 0   apixaban (ELIQUIS) 5 MG TABS tablet Take 1 tablet (5 mg total) by  mouth 2 (two) times daily. 60 tablet 0   budesonide-formoterol (SYMBICORT) 80-4.5 MCG/ACT inhaler Inhale 2 puffs into the lungs 2 (two) times daily. 1 each 5   Camphor-Menthol-Methyl Sal (SALONPAS) 3.04-22-08 % PTCH Apply 1 patch topically as needed (pain).     celecoxib (CELEBREX) 200 MG capsule Take 1 capsule (200 mg total) by mouth daily. 30 capsule 0   Cholecalciferol (VITAMIN D) 50 MCG (2000 UT) CAPS Take 1 capsule by mouth daily.     citalopram (CELEXA) 20 MG tablet TAKE ONE TABLET BY MOUTH ONCE DAILY. (Patient taking differently: Take 20 mg by mouth daily.) 30 tablet 0   cloNIDine (CATAPRES) 0.1 MG tablet TAKE 1 TABLET BY MOUTH ONCE DAILY FOR BLOOD PRESSURE AND ANXIETY. (Patient taking differently: Take 0.1 mg by mouth daily.) 90 tablet 0   Cyanocobalamin (VITAMIN B 12 PO) Take 1 tablet by mouth daily.     DULoxetine (CYMBALTA) 30 MG capsule Take 30 mg by mouth 2 (two) times daily.     furosemide (LASIX) 20 MG tablet Take 1 tablet (20 mg total) by mouth daily. 30 tablet 0   hydrocortisone (ANUSOL-HC) 2.5 % rectal cream Place 1 application rectally 2 (two) times daily. 30 g 0   hydrOXYzine (VISTARIL) 50 MG capsule Take 1 capsule (50 mg total) by mouth daily as needed for anxiety (Insomnia). 30 capsule 5   oxyCODONE (OXY IR/ROXICODONE) 5 MG immediate release tablet Take 5 mg by mouth every 4 (four) hours as needed for severe pain.     potassium chloride SA (KLOR-CON) 20 MEQ tablet Take 1 tablet (20 mEq total) by mouth daily. 30 tablet 0   pregabalin (LYRICA) 75 MG capsule Take 1 capsule (75 mg total) by mouth 2 (two) times daily. 60 capsule 5   tiZANidine (ZANAFLEX) 2 MG tablet TAKE 1 TABLET BY MOUTH TWICE DAILY AS NEEDED FOR MUSCLE SPASMS (Patient taking differently: Take 2 mg by mouth 2 (two) times daily as needed for muscle spasms.) 30 tablet 0   No current facility-administered medications for this visit.    REVIEW OF SYSTEMS:   Review of Systems - Oncology   PHYSICAL  EXAMINATION: ECOG PERFORMANCE STATUS: {CHL ONC ECOG PS:[phone]}  There were no vitals filed for this visit. There were no vitals filed for this visit. Physical Exam   LABORATORY DATA:  I have reviewed the data as listed CBC Latest Ref Rng & Units 02/22/2021 01/23/2021 01/16/2021  WBC 4.0 - 10.5 K/uL 7.3 11.9(H) 11.7(H)  Hemoglobin 12.0 - 15.0 g/dL 10.4(L) 12.2 12.4  Hematocrit 36.0 - 46.0 % 31.7(L) 37.2 37.7  Platelets 150 - 400 K/uL 559(H) 670(H) 674(H)   CMP Latest Ref Rng & Units 02/21/2021 01/23/2021 01/16/2021  Glucose 70 - 99 mg/dL 103(H) 99 99  BUN 6 - 20 mg/dL 8 8 18   Creatinine  0.44 - 1.00 mg/dL 0.86 0.69 0.77  Sodium 135 - 145 mmol/L 140 133(L) 138  Potassium 3.5 - 5.1 mmol/L 4.3 3.5 4.1  Chloride 98 - 111 mmol/L 110 100 104  CO2 22 - 32 mmol/L 21(L) 23 27  Calcium 8.9 - 10.3 mg/dL 8.8(L) 8.5(L) 9.2  Total Protein 6.5 - 8.1 g/dL - 7.3 -  Total Bilirubin 0.3 - 1.2 mg/dL - 0.8 -  Alkaline Phos 38 - 126 U/L - 91 -  AST 15 - 41 U/L - 21 -  ALT 0 - 44 U/L - 24 -    RADIOGRAPHIC STUDIES: I have personally reviewed the radiological images as listed and agreed with the findings in the report. CT Angio Chest PE W and/or Wo Contrast  Result Date: 02/21/2021 CLINICAL DATA:  Left flank pain, productive cough and bilateral leg swelling with shortness of breath. EXAM: CT ANGIOGRAPHY CHEST WITH CONTRAST TECHNIQUE: Multidetector CT imaging of the chest was performed using the standard protocol during bolus administration of intravenous contrast. Multiplanar CT image reconstructions and MIPs were obtained to evaluate the vascular anatomy. CONTRAST:  15mL OMNIPAQUE IOHEXOL 350 MG/ML SOLN COMPARISON:  Chest CT January 23, 2021 and CT PE Sep 11, 2020. FINDINGS: Cardiovascular: Satisfactory opacification of the pulmonary arteries to the segmental level. No evidence of pulmonary embolism. No thoracic aortic aneurysm or dissection. Normal heart size. No significant pericardial effusion/thickening.  Mediastinum/Nodes: Hypoenhancing 1.5 cm left thyroid nodule. No pathologically enlarged mediastinal, hilar or axillary lymph nodes. Mild symmetric distal esophageal wall thickening. Lungs/Pleura: Mild diffuse bronchial wall thickening with mosaic attenuation of the lungs. No focal airspace consolidation. No pleural effusion. No pneumothorax. Upper Abdomen: No acute abnormality. Musculoskeletal: Mild thoracic spondylosis. No acute osseous abnormality. Review of the MIP images confirms the above findings. IMPRESSION: 1. No acute pulmonary embolus. 2. Mild diffuse bronchial wall thickening with mosaic attenuation of the lungs, which may represent small airways disease. 3. Mild symmetric distal esophageal wall thickening, which may represent esophagitis. 4. Hypoenhancing 1.5 cm left thyroid nodule. Recommend further characterization with nonemergent thyroid US. Reference: J Am Coll Radiol. 2015 Feb;12(2): 143-50 Electronically Signed   By: Dahlia Bailiff M.D.   On: 02/21/2021 17:28   DG Chest Portable 1 View  Result Date: 02/21/2021 CLINICAL DATA:  Shortness of breath EXAM: PORTABLE CHEST 1 VIEW COMPARISON:  CT January 23, 2021 and radiograph November 12, 2020. FINDINGS: The heart size and mediastinal contours are within normal limits. Both lungs are clear. The visualized skeletal structures are unremarkable. IMPRESSION: No active disease. Electronically Signed   By: Dahlia Bailiff M.D.   On: 02/21/2021 15:23    ASSESSMENT:  ***   PLAN:  ***   All questions were answered. The patient knows to call the clinic with any problems, questions or concerns.   Derek Jack, MD, 02/23/21 7:12 PM  [place] [phone]   I, ***, am acting as a scribe for Dr. Derek Jack.  {Add Barista Statement}

## 2021-02-23 NOTE — Telephone Encounter (Signed)
Transition Care Management Unsuccessful Follow-up Telephone Call  Date of discharge and from where:  02/22/2021 from [facility]  Attempts:  1st Attempt  Reason for unsuccessful TCM follow-up call:  Left voice message

## 2021-02-24 ENCOUNTER — Emergency Department (HOSPITAL_COMMUNITY): Payer: 59

## 2021-02-24 ENCOUNTER — Other Ambulatory Visit: Payer: Self-pay

## 2021-02-24 ENCOUNTER — Emergency Department (HOSPITAL_COMMUNITY)
Admission: EM | Admit: 2021-02-24 | Discharge: 2021-02-24 | Disposition: A | Discharge status: Home / Self Care | Payer: 59 | Attending: Emergency Medicine | Admitting: Emergency Medicine

## 2021-02-24 ENCOUNTER — Encounter (HOSPITAL_COMMUNITY): Payer: Self-pay | Admitting: Emergency Medicine

## 2021-02-24 DIAGNOSIS — I10 Essential (primary) hypertension: Secondary | ICD-10-CM | POA: Insufficient documentation

## 2021-02-24 DIAGNOSIS — J4530 Mild persistent asthma, uncomplicated: Secondary | ICD-10-CM | POA: Insufficient documentation

## 2021-02-24 DIAGNOSIS — Z79899 Other long term (current) drug therapy: Secondary | ICD-10-CM | POA: Diagnosis not present

## 2021-02-24 DIAGNOSIS — R0602 Shortness of breath: Secondary | ICD-10-CM | POA: Insufficient documentation

## 2021-02-24 DIAGNOSIS — R519 Headache, unspecified: Secondary | ICD-10-CM | POA: Diagnosis not present

## 2021-02-24 DIAGNOSIS — G8929 Other chronic pain: Secondary | ICD-10-CM | POA: Diagnosis not present

## 2021-02-24 DIAGNOSIS — Z7951 Long term (current) use of inhaled steroids: Secondary | ICD-10-CM | POA: Diagnosis not present

## 2021-02-24 DIAGNOSIS — R079 Chest pain, unspecified: Secondary | ICD-10-CM | POA: Diagnosis not present

## 2021-02-24 DIAGNOSIS — Z87891 Personal history of nicotine dependence: Secondary | ICD-10-CM | POA: Insufficient documentation

## 2021-02-24 DIAGNOSIS — M79606 Pain in leg, unspecified: Secondary | ICD-10-CM | POA: Diagnosis not present

## 2021-02-24 DIAGNOSIS — Z7901 Long term (current) use of anticoagulants: Secondary | ICD-10-CM | POA: Diagnosis not present

## 2021-02-24 DIAGNOSIS — M549 Dorsalgia, unspecified: Secondary | ICD-10-CM | POA: Insufficient documentation

## 2021-02-24 DIAGNOSIS — J449 Chronic obstructive pulmonary disease, unspecified: Secondary | ICD-10-CM | POA: Insufficient documentation

## 2021-02-24 DIAGNOSIS — R059 Cough, unspecified: Secondary | ICD-10-CM | POA: Diagnosis present

## 2021-02-24 DIAGNOSIS — R051 Acute cough: Secondary | ICD-10-CM | POA: Diagnosis not present

## 2021-02-24 DIAGNOSIS — Z20822 Contact with and (suspected) exposure to covid-19: Secondary | ICD-10-CM | POA: Insufficient documentation

## 2021-02-24 LAB — CBC WITH DIFFERENTIAL/PLATELET
Abs Immature Granulocytes: 0.05 10*3/uL (ref 0.00–0.07)
Basophils Absolute: 0 10*3/uL (ref 0.0–0.1)
Basophils Relative: 1 %
Eosinophils Absolute: 0.1 10*3/uL (ref 0.0–0.5)
Eosinophils Relative: 1 %
HCT: 34.3 % — ABNORMAL LOW (ref 36.0–46.0)
Hemoglobin: 11 g/dL — ABNORMAL LOW (ref 12.0–15.0)
Immature Granulocytes: 1 %
Lymphocytes Relative: 28 %
Lymphs Abs: 2 10*3/uL (ref 0.7–4.0)
MCH: 28.6 pg (ref 26.0–34.0)
MCHC: 32.1 g/dL (ref 30.0–36.0)
MCV: 89.3 fL (ref 80.0–100.0)
Monocytes Absolute: 0.5 10*3/uL (ref 0.1–1.0)
Monocytes Relative: 7 %
Neutro Abs: 4.5 10*3/uL (ref 1.7–7.7)
Neutrophils Relative %: 62 %
Platelets: 627 10*3/uL — ABNORMAL HIGH (ref 150–400)
RBC: 3.84 MIL/uL — ABNORMAL LOW (ref 3.87–5.11)
RDW: 17 % — ABNORMAL HIGH (ref 11.5–15.5)
WBC: 7.2 10*3/uL (ref 4.0–10.5)
nRBC: 0 % (ref 0.0–0.2)

## 2021-02-24 LAB — BASIC METABOLIC PANEL
Anion gap: 8 (ref 5–15)
BUN: 15 mg/dL (ref 6–20)
CO2: 24 mmol/L (ref 22–32)
Calcium: 8.9 mg/dL (ref 8.9–10.3)
Chloride: 105 mmol/L (ref 98–111)
Creatinine, Ser: 0.74 mg/dL (ref 0.44–1.00)
GFR, Estimated: >60 mL/min (ref >60)
Glucose, Bld: 86 mg/dL (ref 70–99)
Potassium: 4.4 mmol/L (ref 3.5–5.1)
Sodium: 137 mmol/L (ref 135–145)

## 2021-02-24 LAB — TROPONIN I (HIGH SENSITIVITY)
Troponin I (High Sensitivity): 3 ng/L (ref <18)
Troponin I (High Sensitivity): 3 ng/L (ref <18)

## 2021-02-24 LAB — RESP PANEL BY RT-PCR (FLU A&B, COVID) ARPGX2
Influenza A by PCR: NEGATIVE
Influenza B by PCR: NEGATIVE
SARS Coronavirus 2 by RT PCR: NEGATIVE

## 2021-02-24 MED ORDER — OXYCODONE HCL 5 MG PO TABS
5.0000 mg | ORAL_TABLET | Freq: Once | ORAL | Status: AC
  Administered 2021-02-24: 5 mg via ORAL
  Filled 2021-02-24: qty 1

## 2021-02-24 MED ORDER — MORPHINE SULFATE (PF) 4 MG/ML IV SOLN
4.0000 mg | Freq: Once | INTRAVENOUS | Status: AC
  Administered 2021-02-24: 4 mg via INTRAVENOUS
  Filled 2021-02-24: qty 1

## 2021-02-24 NOTE — Telephone Encounter (Signed)
Transition Care Management Unsuccessful Follow-up Telephone Call  Date of discharge and from where:  02/22/2021 from [facility]  Attempts:  2nd Attempt  Reason for unsuccessful TCM follow-up call:  Left voice message

## 2021-02-24 NOTE — ED Triage Notes (Signed)
Discharged from [facility] on Thursday for cough, congestion, fluid on legs and chronic back pain.  Here today for same symptoms.  Cough yellow greenish thick blood tinged secretion.  C/o pain 10/10 for back pain.

## 2021-02-24 NOTE — ED Provider Notes (Signed)
[facility] Provider Note   CSN: [id number] Arrival date & time: 02/24/21  0947     History Chief Complaint  Patient presents with   Cough    Kathryn [name] is a 50 y.o. female.  She has a history of chronic pain COPD hypertension.  She was just admitted to the hospital at [facility] for chest pain shortness of breath, had a negative rule out for MI and CT chest was negative for PE.  She is on anticoagulation.  She said she was unable to fill her Lyrica due to co-pay problems.  Complaining of cough productive of some blood along with chest pain with cough along with worsening of her back pain and leg pain.  The history is provided by the patient.  Cough Cough characteristics:  Productive Sputum characteristics:  Bloody Onset quality:  Gradual Duration:  2 days Timing:  Intermittent Progression:  Unchanged Chronicity:  Recurrent Relieved by:  Nothing Worsened by:  Nothing Ineffective treatments:  None tried Associated symptoms: chest pain, headaches and shortness of breath   Associated symptoms: no fever, no rash and no sore throat       Past Medical History:  Diagnosis Date   Anxiety    Arthritis    Asthma    Bipolar disorder (HCC)    Chronic back pain    COPD (chronic obstructive pulmonary disease) (HCC)    Chronic bronchitis   Depression    GERD (gastroesophageal reflux disease)    Hypertension    Neuropathy    Osteoarthritis of left knee, 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   Sleep apnea 04/2020   GETTING A cpap   Suicidal ideation 12/24/2018    Patient Active Problem List   Diagnosis Date Noted   Chest pain 02/21/2021   Thyroid nodule 02/21/2021   Anxiety    History of pulmonary embolism    Chronic pain    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   Encounter for examination following treatment at hospital 01/17/2021   Preoperative examination 01/17/2021   Bilateral leg edema 10/18/2020   Morbid obesity ([place])  10/18/2020   S/P lumbar fusion 09/14/2020   History of DVT of lower extremity 09/14/2020   Spondylolisthesis at L4-L5 level 03/29/2020   Insomnia 03/19/2020   Mild persistent asthma without complication 09/62/8366   Need for immunization against influenza 03/19/2020   Body mass index (BMI) 38.0-38.9, adult 03/19/2020   GERD (gastroesophageal reflux disease) 03/19/2020   Intermittent palpitations 12/17/2019   Snoring 12/17/2019   Excessive daytime sleepiness 12/17/2019   Nightmare disorder, 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   Enlarged uterus 02/20/2018   Screening for colorectal cancer 02/20/2018   OA (osteoarthritis) of knee 12/27/2017   MDD (major depressive disorder), recurrent episode, severe ([place]) 12/24/2017   Lumbar spinal stenosis 05/11/2017   Low vitamin B12 level 05/11/2017   Hypertension 01/11/2017    Past Surgical History:  Procedure Laterality Date   BACK SURGERY     DILATATION AND CURETTAGE/HYSTEROSCOPY WITH MINERVA N/A 06/09/2020   Procedure: DILATATION AND CURETTAGE/HYSTEROSCOPY WITH MINERVA;  Surgeon: Florian Buff, MD;  Location: [facility];  Service: Gynecology;  Laterality: N/A;   ESOPHAGOGASTRODUODENOSCOPY (EGD) WITH PROPOFOL N/A 12/25/2018   Procedure: ESOPHAGOGASTRODUODENOSCOPY (EGD) WITH PROPOFOL;  Surgeon: Rogene Houston, MD;  [facility]: [facility];  Service: Endoscopy;  Laterality: N/A;   PATELLA-FEMORAL ARTHROPLASTY Left 10/08/2018   Procedure: PATELLA-FEMORAL ARTHROPLASTY;  Surgeon: Marchia Bond, MD;  Location: [facility];  Service: Orthopedics;  Laterality: Left;   TUBAL  LIGATION       OB History     Gravida  5   Para  3   Term  2   Preterm  1   AB  2   Living  3      SAB  1   IAB  1   Ectopic      Multiple      Live Births              Family History  Problem Relation Age of Onset   Gout Paternal Grandfather    Cirrhosis Paternal Grandfather    Hypertension Paternal Grandmother    Aneurysm Paternal Grandmother    Cirrhosis  Maternal Grandmother    Cirrhosis Maternal Grandfather    Cancer Father    Cirrhosis Father    Cirrhosis Mother    Breast cancer Sister    Hypertension Sister    Bronchitis Daughter    Bronchitis Daughter    Asthma Son    Bronchitis Son    Migraines Neg Hx     Social History   Tobacco Use   Smoking status: Former    Types: Cigarettes    Quit date: 2016    Years since quitting: 6.8   Smokeless tobacco: Never   Tobacco comments:    wears nicotine patches  Vaping Use   Vaping Use: Never used  Substance Use Topics   Alcohol use: Not Currently   Drug use: Not Currently    Types: Cocaine    Comment: crack  last used 2016    Home Medications Prior to Admission medications   Medication Sig Start Date End Date Taking? Authorizing Provider  albuterol (PROVENTIL) (2.5 MG/3ML) 0.083% nebulizer solution Take 2.5 mg by nebulization every 6 (six) hours as needed for wheezing or shortness of breath.    [provider]  amLODipine (NORVASC) 5 MG tablet TAKE 1 TABLET BY MOUTH ONCE A DAY FOR BLOOD PRESSURE. Patient taking differently: Take 5 mg by mouth daily. 10/26/20   Lindell Spar, MD  apixaban (ELIQUIS) 5 MG TABS tablet Take 1 tablet (5 mg total) by mouth 2 (two) times daily. 02/22/21   Arrien, Jimmy Picket, MD  budesonide-formoterol [facility]) 80-4.5 MCG/ACT inhaler Inhale 2 puffs into the lungs 2 (two) times daily. 03/19/20   Lindell Spar, MD  Camphor-Menthol-Methyl Sal (SALONPAS) 3.04-22-08 % PTCH Apply 1 patch topically as needed (pain).    [provider]  celecoxib (CELEBREX) 200 MG capsule Take 1 capsule (200 mg total) by mouth daily. 11/23/20   Lindell Spar, MD  Cholecalciferol (VITAMIN D) 50 MCG (2000 UT) CAPS Take 1 capsule by mouth daily.    [provider]  citalopram (CELEXA) 20 MG tablet TAKE ONE TABLET BY MOUTH ONCE DAILY. Patient taking differently: Take 20 mg by mouth daily. 12/27/20   Lindell Spar, MD  cloNIDine (CATAPRES) 0.1 MG  tablet TAKE 1 TABLET BY MOUTH ONCE DAILY FOR BLOOD PRESSURE AND ANXIETY. Patient taking differently: Take 0.1 mg by mouth daily. 01/13/21   Lindell Spar, MD  Cyanocobalamin (VITAMIN B 12 PO) Take 1 tablet by mouth daily.    [provider]  DULoxetine (CYMBALTA) 30 MG capsule Take 30 mg by mouth 2 (two) times daily. 11/26/20   [provider]  furosemide (LASIX) 20 MG tablet Take 1 tablet (20 mg total) by mouth daily. 02/22/21 03/24/21  Arrien, Jimmy Picket, MD  hydrocortisone (ANUSOL-HC) 2.5 % rectal cream Place 1 application rectally 2 (two)  times daily. 11/18/20   Lindell Spar, MD  hydrOXYzine (VISTARIL) 50 MG capsule Take 1 capsule (50 mg total) by mouth daily as needed for anxiety (Insomnia). 11/18/20   Lindell Spar, MD  oxyCODONE (OXY IR/ROXICODONE) 5 MG immediate release tablet Take 5 mg by mouth every 4 (four) hours as needed for severe pain.    [provider]  potassium chloride SA (KLOR-CON) 20 MEQ tablet Take 1 tablet (20 mEq total) by mouth daily. 02/22/21   Arrien, Jimmy Picket, MD  pregabalin (LYRICA) 75 MG capsule Take 1 capsule (75 mg total) by mouth 2 (two) times daily. 01/17/21   Lindell Spar, MD  tiZANidine (ZANAFLEX) 2 MG tablet TAKE 1 TABLET BY MOUTH TWICE DAILY AS NEEDED FOR MUSCLE SPASMS Patient taking differently: Take 2 mg by mouth 2 (two) times daily as needed for muscle spasms. 02/04/21   Lindell Spar, MD    Allergies    Clonopin [clonazepam], Fish allergy, Flexeril [cyclobenzaprine hcl], Ibuprofen, Shellfish allergy, Tylenol [acetaminophen], Ace inhibitors, Cyclobenzaprine, Tramadol, and Trazodone and nefazodone  Review of Systems   Review of Systems  Constitutional:  Negative for fever.  HENT:  Negative for sore throat.   Eyes:  Negative for visual disturbance.  Respiratory:  Positive for cough and shortness of breath.   Cardiovascular:  Positive for chest pain.  Gastrointestinal:  Negative for vomiting.  Genitourinary:   Negative for dysuria.  Musculoskeletal:  Positive for back pain.  Skin:  Negative for rash.  Neurological:  Positive for headaches.   Physical Exam Updated Vital Signs BP 120/82 (BP Location: Left Arm)   Pulse 86   Temp 98.6 F (37 C) (Oral)   Resp 18   Ht 4\' 11"  (1.499 m)   Wt 81.6 kg   LMP 06/07/2020   SpO2 99%   BMI 36.33 kg/m   Physical Exam Vitals and nursing note reviewed.  Constitutional:      General: She is not in acute distress.    Appearance: Normal appearance. She is well-developed.  HENT:     Head: Normocephalic and atraumatic.  Eyes:     Conjunctiva/sclera: Conjunctivae normal.  Cardiovascular:     Rate and Rhythm: Normal rate and regular rhythm.     Heart sounds: No murmur heard. Pulmonary:     Effort: Pulmonary effort is normal. No respiratory distress.     Breath sounds: Normal breath sounds.  Abdominal:     Palpations: Abdomen is soft.     Tenderness: There is no abdominal tenderness. There is no guarding or rebound.  Musculoskeletal:        General: Normal range of motion.     Cervical back: Neck supple.  Skin:    General: Skin is warm and dry.  Neurological:     General: No focal deficit present.     Mental Status: She is alert.    ED Results / Procedures / Treatments   Labs (all labs ordered are listed, but only abnormal results are displayed) Labs Reviewed  CBC WITH DIFFERENTIAL/PLATELET - Abnormal; Notable for the following components:      Result Value   RBC 3.84 (*)    Hemoglobin 11.0 (*)    HCT 34.3 (*)    RDW 17.0 (*)    Platelets 627 (*)    All other components within normal limits  RESP PANEL BY RT-PCR (FLU A&B, COVID) ARPGX2  BASIC METABOLIC PANEL  TROPONIN I (HIGH SENSITIVITY)  TROPONIN I (HIGH SENSITIVITY)    EKG  EKG Interpretation  Date/Time:  Thursday February 24 2021 09:08:12 EST Ventricular Rate:  79 PR Interval:  131 QRS Duration: 89 QT Interval:  379 QTC Calculation: 435 R Axis:   70 Text  Interpretation: Sinus rhythm No significant change since prior 11/22 Confirmed by Aletta Edouard [phone]) on 02/24/2021 9:18:00 AM  Radiology DG Chest Port 1 View  Result Date: 02/24/2021 CLINICAL DATA:  Cough and congestion EXAM: PORTABLE CHEST 1 VIEW COMPARISON:  Three days ago FINDINGS: Normal heart size and mediastinal contours. No acute infiltrate or edema. No effusion or pneumothorax. No acute osseous findings. IMPRESSION: Negative chest. Electronically Signed   By: Jorje Guild M.D.   On: 02/24/2021 08:51    Procedures Procedures   Medications Ordered in ED Medications  oxyCODONE (Oxy IR/ROXICODONE) immediate release tablet 5 mg (5 mg Oral Given 02/24/21 0856)  morphine 4 MG/ML injection 4 mg (4 mg Intravenous Given 02/24/21 1128)    ED Course  I have reviewed the triage vital signs and the nursing notes.  Pertinent labs & imaging results that were available during my care of the patient were reviewed by me and considered in my medical decision making (see chart for details).    MDM Rules/Calculators/A&P                          This patient complains of cough shortness of breath chest pain back pain leg pain; this involves an extensive number of treatment Options and is a complaint that carries with it a high risk of complications and Morbidity. The differential includes musculoskeletal pain, ACS, pneumonia, PE, COVID, flu  I ordered, reviewed and interpreted labs, which included CBC with normal white count, hemoglobin stable from priors, chemistries normal, troponins flat, COVID and flu negative I ordered medication oral and IM pain medication with improvement in her symptoms I ordered imaging studies which included chest x-ray and I independently    visualized and interpreted imaging which showed no acute infiltrat Previous records obtained and reviewed in epic including recent admission and discharge from [facility].  During that admission she had cardiac rule out along with  CT angio chest that showed no evidence of PE.  After the interventions stated above, I reevaluated the patient and found patient to be hemodynamically stable satting well on room air.  Recommended follow-up with primary care doctor.  Return instructions discussed   Final Clinical Impression(s) / ED Diagnoses Final diagnoses:  Acute cough  Shortness of breath  Chronic back pain, unspecified back location, unspecified back pain laterality    Rx / DC Orders ED Discharge Orders     None        Hayden Rasmussen, MD 02/24/21 2043

## 2021-02-24 NOTE — Discharge Instructions (Signed)
You were seen in the emergency department for continued cough and shortness of breath and worsening of your chronic low back pain and leg pain.  Had blood work chest x-ray COVID and flu testing that did not show any significant abnormalities.  Please continue your regular medications and follow-up with your primary care doctor.  Return to the emergency department if any worsening or concerning symptoms.

## 2021-02-25 ENCOUNTER — Encounter (HOSPITAL_COMMUNITY): Payer: 59

## 2021-02-25 ENCOUNTER — Telehealth: Payer: Self-pay

## 2021-02-25 ENCOUNTER — Inpatient Hospital Stay (HOSPITAL_COMMUNITY): Payer: 59 | Attending: Hematology | Admitting: Hematology

## 2021-02-25 DIAGNOSIS — Z789 Other specified health status: Secondary | ICD-10-CM

## 2021-02-25 NOTE — Telephone Encounter (Signed)
Transition Care Management Unsuccessful Follow-up Telephone Call  Date of discharge and from where:  02/24/2021 from [facility]  Attempts:  1st Attempt  Reason for unsuccessful TCM follow-up call:  Left voice message

## 2021-02-25 NOTE — Telephone Encounter (Signed)
Transition Care Management Unsuccessful Follow-up Telephone Call  Date of discharge and from where:  02/22/2021 from [facility]  Attempts:  3rd Attempt  Reason for unsuccessful TCM follow-up call:  Unable to reach patient

## 2021-02-28 ENCOUNTER — Encounter (HOSPITAL_COMMUNITY): Payer: 59

## 2021-02-28 NOTE — Telephone Encounter (Addendum)
Transition Care Management Unsuccessful Follow-up Telephone Call  Date of discharge and from where:  02/24/2021 from [facility]  Attempts:  2nd Attempt  Reason for unsuccessful TCM follow-up call:  Left voice message

## 2021-03-01 NOTE — Telephone Encounter (Signed)
Transition Care Management Unsuccessful Follow-up Telephone Call  Date of discharge and from where:  02/24/2021 from [facility]  Attempts:  3rd Attempt  Reason for unsuccessful TCM follow-up call:  Unable to reach patient

## 2021-03-02 ENCOUNTER — Encounter (HOSPITAL_COMMUNITY): Payer: 59 | Admitting: Physical Therapy

## 2021-03-04 ENCOUNTER — Encounter (HOSPITAL_COMMUNITY): Payer: 59

## 2021-03-04 ENCOUNTER — Inpatient Hospital Stay: Payer: 59 | Admitting: Nurse Practitioner

## 2021-03-04 ENCOUNTER — Encounter (HOSPITAL_COMMUNITY): Payer: Self-pay

## 2021-03-04 ENCOUNTER — Emergency Department (HOSPITAL_COMMUNITY)
Admission: EM | Admit: 2021-03-04 | Discharge: 2021-03-04 | Disposition: A | Discharge status: Home / Self Care | Payer: 59 | Attending: Emergency Medicine | Admitting: Emergency Medicine

## 2021-03-04 ENCOUNTER — Other Ambulatory Visit: Payer: Self-pay

## 2021-03-04 ENCOUNTER — Telehealth: Payer: Self-pay

## 2021-03-04 DIAGNOSIS — Z79899 Other long term (current) drug therapy: Secondary | ICD-10-CM | POA: Insufficient documentation

## 2021-03-04 DIAGNOSIS — M545 Low back pain, unspecified: Secondary | ICD-10-CM | POA: Insufficient documentation

## 2021-03-04 DIAGNOSIS — J449 Chronic obstructive pulmonary disease, unspecified: Secondary | ICD-10-CM | POA: Insufficient documentation

## 2021-03-04 DIAGNOSIS — I10 Essential (primary) hypertension: Secondary | ICD-10-CM | POA: Diagnosis not present

## 2021-03-04 DIAGNOSIS — Z87891 Personal history of nicotine dependence: Secondary | ICD-10-CM | POA: Insufficient documentation

## 2021-03-04 DIAGNOSIS — J45909 Unspecified asthma, uncomplicated: Secondary | ICD-10-CM | POA: Insufficient documentation

## 2021-03-04 LAB — URINALYSIS, ROUTINE W REFLEX MICROSCOPIC
Bacteria, UA: NONE SEEN
Bilirubin Urine: NEGATIVE
Glucose, UA: NEGATIVE mg/dL
Hgb urine dipstick: NEGATIVE
Ketones, ur: NEGATIVE mg/dL
Nitrite: NEGATIVE
Protein, ur: NEGATIVE mg/dL
Specific Gravity, Urine: 1.02 (ref 1.005–1.030)
pH: 6 (ref 5.0–8.0)

## 2021-03-04 LAB — COMPREHENSIVE METABOLIC PANEL
ALT: 18 U/L (ref 0–44)
AST: 16 U/L (ref 15–41)
Albumin: 4 g/dL (ref 3.5–5.0)
Alkaline Phosphatase: 80 U/L (ref 38–126)
Anion gap: 14 (ref 5–15)
BUN: 19 mg/dL (ref 6–20)
CO2: 19 mmol/L — ABNORMAL LOW (ref 22–32)
Calcium: 9.2 mg/dL (ref 8.9–10.3)
Chloride: 105 mmol/L (ref 98–111)
Creatinine, Ser: 0.77 mg/dL (ref 0.44–1.00)
GFR, Estimated: >60 mL/min (ref >60)
Glucose, Bld: 90 mg/dL (ref 70–99)
Potassium: 3.6 mmol/L (ref 3.5–5.1)
Sodium: 138 mmol/L (ref 135–145)
Total Bilirubin: 0.5 mg/dL (ref 0.3–1.2)
Total Protein: 8 g/dL (ref 6.5–8.1)

## 2021-03-04 LAB — CBC WITH DIFFERENTIAL/PLATELET
Abs Immature Granulocytes: 0.06 10*3/uL (ref 0.00–0.07)
Basophils Absolute: 0 10*3/uL (ref 0.0–0.1)
Basophils Relative: 0 %
Eosinophils Absolute: 0 10*3/uL (ref 0.0–0.5)
Eosinophils Relative: 0 %
HCT: 39.7 % (ref 36.0–46.0)
Hemoglobin: 12.7 g/dL (ref 12.0–15.0)
Immature Granulocytes: 1 %
Lymphocytes Relative: 20 %
Lymphs Abs: 2.2 10*3/uL (ref 0.7–4.0)
MCH: 28 pg (ref 26.0–34.0)
MCHC: 32 g/dL (ref 30.0–36.0)
MCV: 87.6 fL (ref 80.0–100.0)
Monocytes Absolute: 0.4 10*3/uL (ref 0.1–1.0)
Monocytes Relative: 3 %
Neutro Abs: 8.6 10*3/uL — ABNORMAL HIGH (ref 1.7–7.7)
Neutrophils Relative %: 76 %
Platelets: 724 10*3/uL — ABNORMAL HIGH (ref 150–400)
RBC: 4.53 MIL/uL (ref 3.87–5.11)
RDW: 16.6 % — ABNORMAL HIGH (ref 11.5–15.5)
WBC: 11.3 10*3/uL — ABNORMAL HIGH (ref 4.0–10.5)
nRBC: 0 % (ref 0.0–0.2)

## 2021-03-04 MED ORDER — ONDANSETRON HCL 4 MG/2ML IJ SOLN
4.0000 mg | Freq: Once | INTRAMUSCULAR | Status: AC
  Administered 2021-03-04: 4 mg via INTRAVENOUS
  Filled 2021-03-04: qty 2

## 2021-03-04 MED ORDER — DIAZEPAM 5 MG PO TABS: 5.0000 mg | ORAL_TABLET | Freq: Two times a day (BID) | ORAL | 0 refills | Status: DC

## 2021-03-04 MED ORDER — HYDROMORPHONE HCL 1 MG/ML IJ SOLN
1.0000 mg | Freq: Once | INTRAMUSCULAR | Status: AC
  Administered 2021-03-04: 1 mg via INTRAVENOUS
  Filled 2021-03-04: qty 1

## 2021-03-04 MED ORDER — SODIUM CHLORIDE 0.9 % IV BOLUS
1000.0000 mL | Freq: Once | INTRAVENOUS | Status: AC
  Administered 2021-03-04: 1000 mL via INTRAVENOUS

## 2021-03-04 MED ORDER — MORPHINE SULFATE (PF) 4 MG/ML IV SOLN
4.0000 mg | Freq: Once | INTRAVENOUS | Status: AC
  Administered 2021-03-04: 4 mg via INTRAVENOUS
  Filled 2021-03-04: qty 1

## 2021-03-04 MED ORDER — HYDROCODONE-ACETAMINOPHEN 5-325 MG PO TABS: 1.0000 | ORAL_TABLET | ORAL | 0 refills | Status: DC | PRN

## 2021-03-04 NOTE — ED Notes (Signed)
Pt returned from CT °

## 2021-03-04 NOTE — ED Provider Notes (Signed)
[facility] Provider Note   CSN: [id number] Arrival date & time: 03/04/21  3295     History Chief Complaint  Patient presents with   Flank Pain    Kathryn [name] is a 50 y.o. female.  Pt presents to the ED today with left sided flank pain.  Pt said it radiates up to her neck.  Pt denies any urinary sx.  Pt said pain is worse with movement.  No f/c.  No numbness or pain in her arms or legs.  She is ambulatory.      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   Encounter for examination following treatment at hospital 01/17/2021   Preoperative examination 01/17/2021   Bilateral leg edema 10/18/2020   Morbid obesity ([place]) 10/18/2020   S/P lumbar fusion 09/14/2020   History of DVT of lower extremity 09/14/2020   Spondylolisthesis at L4-L5 level 03/29/2020   Insomnia 03/19/2020   Mild persistent asthma without complication 18/84/1660   Need for immunization against influenza 03/19/2020   Body mass index (BMI) 38.0-38.9, adult 03/19/2020   GERD (gastroesophageal reflux disease) 03/19/2020   Intermittent palpitations 12/17/2019   Snoring 12/17/2019   Excessive daytime sleepiness 12/17/2019   Nightmare disorder, 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   Enlarged uterus 02/20/2018   Screening for colorectal cancer 02/20/2018   OA (osteoarthritis) of knee  12/27/2017   MDD (major depressive disorder), recurrent episode, severe ([place]) 12/24/2017   Lumbar spinal stenosis 05/11/2017   Low vitamin B12 level 05/11/2017   Hypertension 01/11/2017    Past Surgical History:  Procedure Laterality Date   BACK SURGERY     DILATATION AND CURETTAGE/HYSTEROSCOPY WITH MINERVA N/A 06/09/2020   Procedure: DILATATION AND CURETTAGE/HYSTEROSCOPY WITH MINERVA;  Surgeon: Florian Buff, MD;  Location: [facility];  Service: Gynecology;  Laterality: N/A;   ESOPHAGOGASTRODUODENOSCOPY (EGD) WITH PROPOFOL N/A 12/25/2018   Procedure: ESOPHAGOGASTRODUODENOSCOPY (EGD) WITH PROPOFOL;  Surgeon: Rogene Houston, MD;  Location: [facility];  Service: Endoscopy;  Laterality: N/A;   PATELLA-FEMORAL ARTHROPLASTY Left 10/08/2018   Procedure: PATELLA-FEMORAL ARTHROPLASTY;  Surgeon: Marchia Bond, MD;  Location: [facility];  Service: Orthopedics;  Laterality: Left;   TUBAL LIGATION       OB History     Gravida  5   Para  3   Term  2   Preterm  1   AB  2   Living  3      SAB  1   IAB  1   Ectopic      Multiple      Live Births              Family History  Problem Relation Age of Onset   Gout Paternal Grandfather    Cirrhosis Paternal  Grandfather    Hypertension Paternal Grandmother    Aneurysm Paternal Grandmother    Cirrhosis Maternal Grandmother    Cirrhosis Maternal Grandfather    Cancer Father    Cirrhosis Father    Cirrhosis Mother    Breast cancer Sister    Hypertension Sister    Bronchitis Daughter    Bronchitis Daughter    Asthma Son    Bronchitis Son    Migraines Neg Hx     Social History   Tobacco Use   Smoking status: Former    Types: Cigarettes    Quit date: 2016    Years since quitting: 6.8   Smokeless tobacco: Never  Vaping Use   Vaping Use: Never used  Substance Use Topics   Alcohol use: Not Currently   Drug use: Not Currently    Types: Cocaine    Comment: crack  last used 2016    Home Medications Prior to  Admission medications   Medication Sig Start Date End Date Taking? Authorizing Provider  albuterol (PROVENTIL) (2.5 MG/3ML) 0.083% nebulizer solution Take 2.5 mg by nebulization every 6 (six) hours as needed for wheezing or shortness of breath.   Yes [provider]  amLODipine (NORVASC) 5 MG tablet TAKE 1 TABLET BY MOUTH ONCE A DAY FOR BLOOD PRESSURE. Patient taking differently: Take 5 mg by mouth daily. 10/26/20  Yes Lindell Spar, MD  apixaban (ELIQUIS) 5 MG TABS tablet Take 1 tablet (5 mg total) by mouth 2 (two) times daily. 02/22/21  Yes Arrien, Jimmy Picket, MD  budesonide-formoterol [facility]) 80-4.5 MCG/ACT inhaler Inhale 2 puffs into the lungs 2 (two) times daily. 03/19/20  Yes Patel, Colin Broach, MD  Camphor-Menthol-Methyl Sal (SALONPAS) 3.04-22-08 % PTCH Apply 1 patch topically as needed (pain).   Yes [provider]  Cholecalciferol (VITAMIN D) 50 MCG (2000 UT) CAPS Take 1 capsule by mouth daily.   Yes [provider]  citalopram (CELEXA) 20 MG tablet TAKE ONE TABLET BY MOUTH ONCE DAILY. Patient taking differently: Take 20 mg by mouth daily. 12/27/20  Yes Lindell Spar, MD  cloNIDine (CATAPRES) 0.1 MG tablet TAKE 1 TABLET BY MOUTH ONCE DAILY FOR BLOOD PRESSURE AND ANXIETY. Patient taking differently: Take 0.1 mg by mouth daily. 01/13/21  Yes Lindell Spar, MD  Cyanocobalamin (VITAMIN B 12 PO) Take 1 tablet by mouth daily.   Yes [provider]  diazepam (VALIUM) 5 MG tablet Take 1 tablet (5 mg total) by mouth 2 (two) times daily. 03/04/21  Yes [name], MD  DULoxetine (CYMBALTA) 30 MG capsule Take 30 mg by mouth 2 (two) times daily. 11/26/20  Yes [provider]  furosemide (LASIX) 20 MG tablet Take 1 tablet (20 mg total) by mouth daily. 02/22/21 03/24/21 Yes Arrien, Jimmy Picket, MD  HYDROcodone-acetaminophen (NORCO/VICODIN) 5-325 MG tablet Take 1 tablet by mouth every 4 (four) hours as needed. 03/04/21  Yes [name], MD   hydrocortisone (ANUSOL-HC) 2.5 % rectal cream Place 1 application rectally 2 (two) times daily. 11/18/20  Yes Lindell Spar, MD  hydrOXYzine (VISTARIL) 50 MG capsule Take 1 capsule (50 mg total) by mouth daily as needed for anxiety (Insomnia). 11/18/20  Yes Lindell Spar, MD  oxyCODONE (OXY IR/ROXICODONE) 5 MG immediate release tablet Take 5 mg by mouth every 4 (four) hours as needed for severe pain.   Yes [provider]  potassium chloride SA (KLOR-CON) 20 MEQ tablet Take 1 tablet (20 mEq total) by mouth daily. 02/22/21  Yes Arrien,  Jimmy Picket, MD  pregabalin (LYRICA) 75 MG capsule Take 1 capsule (75 mg total) by mouth 2 (two) times daily. 01/17/21  Yes Patel, Colin Broach, MD  tiZANidine (ZANAFLEX) 2 MG tablet TAKE 1 TABLET BY MOUTH TWICE DAILY AS NEEDED FOR MUSCLE SPASMS Patient taking differently: Take 2 mg by mouth 2 (two) times daily as needed for muscle spasms. 02/04/21  Yes Lindell Spar, MD    Allergies    Clonopin [clonazepam], Fish allergy, Flexeril [cyclobenzaprine hcl], Ibuprofen, Shellfish allergy, Tylenol [acetaminophen], Ace inhibitors, Cyclobenzaprine, Tramadol, and Trazodone and nefazodone  Review of Systems   Review of Systems  Musculoskeletal:  Positive for back pain.  All other systems reviewed and are negative.  Physical Exam Updated Vital Signs BP (!) 144/95   Pulse 76   Temp 98.4 F (36.9 C) (Oral)   Resp 20   Ht 4\' 11"  (1.499 m)   Wt 81.6 kg   LMP 06/07/2020   SpO2 99%   BMI 36.33 kg/m   Physical Exam Vitals and nursing note reviewed.  Constitutional:      Appearance: Normal appearance. She is obese.  HENT:     Head: Normocephalic and atraumatic.     Right Ear: External ear normal.     Left Ear: External ear normal.     Nose: Nose normal.     Mouth/Throat:     Mouth: Mucous membranes are moist.     Pharynx: Oropharynx is clear.  Eyes:     Extraocular Movements: Extraocular movements intact.     Conjunctiva/sclera: Conjunctivae normal.      Pupils: Pupils are equal, round, and reactive to light.  Cardiovascular:     Rate and Rhythm: Normal rate and regular rhythm.     Pulses: Normal pulses.     Heart sounds: Normal heart sounds.  Pulmonary:     Effort: Pulmonary effort is normal.     Breath sounds: Normal breath sounds.  Abdominal:     General: Abdomen is flat. Bowel sounds are normal.     Palpations: Abdomen is soft.  Musculoskeletal:        General: Normal range of motion.     Cervical back: Normal range of motion and neck supple.       Back:  Skin:    General: Skin is warm.     Capillary Refill: Capillary refill takes less than 2 seconds.  Neurological:     General: No focal deficit present.     Mental Status: She is alert and oriented to person, place, and time.  Psychiatric:        Mood and Affect: Mood normal.        Behavior: Behavior normal.    ED Results / Procedures / Treatments   Labs (all labs ordered are listed, but only abnormal results are displayed) Labs Reviewed  URINALYSIS, ROUTINE W REFLEX MICROSCOPIC - Abnormal; Notable for the following components:      Result Value   APPearance HAZY (*)    Leukocytes,Ua MODERATE (*)    All other components within normal limits  CBC WITH DIFFERENTIAL/PLATELET - Abnormal; Notable for the following components:   WBC 11.3 (*)    RDW 16.6 (*)    Platelets 724 (*)    Neutro Abs 8.6 (*)    All other components within normal limits  COMPREHENSIVE METABOLIC PANEL - Abnormal; Notable for the following components:   CO2 19 (*)    All other components within normal limits    EKG None  Radiology No results found.  Procedures Procedures   Medications Ordered in ED Medications  morphine 4 MG/ML injection 4 mg (4 mg Intravenous Given 03/04/21 1027)  sodium chloride 0.9 % bolus 1,000 mL (0 mLs Intravenous Stopped 03/04/21 1337)  ondansetron (ZOFRAN) injection 4 mg (4 mg Intravenous Given 03/04/21 1026)  HYDROmorphone (DILAUDID) injection 1 mg (1 mg  Intravenous Given 03/04/21 1215)    ED Course  I have reviewed the triage vital signs and the nursing notes.  Pertinent labs & imaging results that were available during my care of the patient were reviewed by me and considered in my medical decision making (see chart for details).    MDM Rules/Calculators/A&P                           Labs and urine are unremarkable.  She is feeling better.  Pain is very msk in nature.  She is stable for d/c.  Return if worse.  F/u with pcp. Final Clinical Impression(s) / ED Diagnoses Final diagnoses:  Acute left-sided low back pain without sciatica    Rx / DC Orders ED Discharge Orders          Ordered    HYDROcodone-acetaminophen (NORCO/VICODIN) 5-325 MG tablet  Every 4 hours PRN        03/04/21 1442    diazepam (VALIUM) 5 MG tablet  2 times daily        03/04/21 1442             [name], MD 03/04/21 1445

## 2021-03-04 NOTE — Telephone Encounter (Signed)
REFERRAL SCANNED TO REFERRAL.....Marland KitchenNOTES IN EPIC

## 2021-03-04 NOTE — ED Triage Notes (Signed)
Pt arrived via POV. Pt complaining of left sided flank pain that radiates up back of neck. Denies any urinary symptoms.

## 2021-03-07 ENCOUNTER — Encounter (HOSPITAL_COMMUNITY): Payer: 59 | Admitting: Physical Therapy

## 2021-03-07 ENCOUNTER — Telehealth: Payer: Self-pay

## 2021-03-07 ENCOUNTER — Telehealth: Payer: Self-pay | Admitting: Internal Medicine

## 2021-03-07 NOTE — Telephone Encounter (Signed)
Transition Care Management Follow-up Telephone Call Date of discharge and from where: 03/04/2021 from [facility] How have you been since you were released from the hospital? Pt stated that she is feeling much better. Pt did not have any questions or concerns at this time.  Any questions or concerns? No  Items Reviewed: Did the pt receive and understand the discharge instructions provided? Yes  Medications obtained and verified? Yes  Other? No  Any new allergies since your discharge? No  Dietary orders reviewed? No Do you have support at home? Yes   Functional Questionnaire: (I = Independent and D = Dependent) ADLs: I  Bathing/Dressing- I  Meal Prep- I  Eating- I  Maintaining continence- I  Transferring/Ambulation- I  Managing Meds- I   Follow up appointments reviewed:  PCP Hospital f/u appt confirmed? Yes  Scheduled to see Ihor Dow, MD on 03/24/2021 @ 11:00am. [facility] f/u appt confirmed? No   Are transportation arrangements needed? No  If their condition worsens, is the pt aware to call PCP or go to the Emergency Dept.? Yes Was the patient provided with contact information for the PCP's office or ED? Yes Was to pt encouraged to call back with questions or concerns? Yes

## 2021-03-07 NOTE — Patient Instructions (Signed)
Visit Information  [name] was given information about Medicaid Managed Care team care coordination services as a part of their Healthy [facility] Medicaid benefit. Kathryn [name] verbally consented to engagement with the [facility] Managed Care team.   If you are experiencing a medical emergency, please call 911 or report to your local emergency department or urgent care.   If you have a non-emergency medical problem during routine business hours, please contact your provider's office and ask to speak with a nurse.   For questions related to your Healthy [facility] health plan, please call: [phone] or visit the homepage here: [email or web address]  If you would like to schedule transportation through your Healthy [facility] plan, please call the following number at least 2 days in advance of your appointment: [phone]  Call the [place] at [phone], at any time, 24 hours a day, 7 days a week. If you are in danger or need immediate medical attention call 911.  If you would like help to quit smoking, call 1-800-QUIT-NOW (252) 533-7383) OR Espaol: 1-855-Djelo-Ya (4-920-100-7121) o para ms informacin haga clic aqu or Text READY to 200-400 to register via text  Kathryn [name] - following are the goals we discussed in your visit today:   Goals Addressed   None      Social Worker will CONNECT PATIENT WITH MM TEAM.   . Kathryn [name], BSW, [place] Managed Medicaid Team  [phone]   Following is a copy of your plan of care:  There are no care plans that you recently modified to display for this patient.

## 2021-03-07 NOTE — Patient Outreach (Signed)
Care Coordination  03/07/2021  Kathryn [name] Aug 04, 1970 [id number]  Transition Care Management Follow-up Telephone Call Date of discharge and from where:03/04/21 [facility] How have you been since you were released from the hospital? [name], doing a little better Any questions or concerns? Yes  Items Reviewed: Did the pt receive and understand the discharge instructions provided? Yes  Medications obtained and verified? Yes  Other? Yes  Any new allergies since your discharge? No  Dietary orders reviewed? Yes Do you have support at home? Yes   Home Care and Equipment/Supplies: Were home health services ordered? no If so, what is the name of the agency? N/A Has the agency set up a time to come to the patient's home? not applicable Were any new equipment or medical supplies ordered?  No What is the name of the medical supply agency? N/A Were you able to get the supplies/equipment? not applicable Do you have any questions related to the use of the equipment or supplies? No  Functional Questionnaire: (I = Independent and D = Dependent) ADLs: I  Bathing/Dressing- I  Meal Prep- I  Eating- I  Maintaining continence- I  Transferring/Ambulation- I  Managing Meds- I  Follow up appointments reviewed:  PCP Hospital f/u appt confirmed?  PATIENT STATED SHE WAS GETTING READY TO CONTACT PCP TO GET IT SCHEDULED.  Scheduled to see  [facility]t confirmed?  NO.Scheduled to see . Are transportation arrangements needed? No  If their condition worsens, is the pt aware to call PCP or go to the Emergency Dept.? Yes Was the patient provided with contact information for the PCP's office or ED? Yes Was to pt encouraged to call back with questions or concerns? Yes

## 2021-03-07 NOTE — Telephone Encounter (Signed)
..   Medicaid Managed Care   Unsuccessful Outreach Note  03/07/2021 Name: Kathryn [name] MRN: [id number] DOB: 1971-02-19  Referred by: Lindell Spar, MD Reason for referral : High Risk Managed Medicaid (I called the patient today to get her scheduled with the [facility] Pharmacist. Person that answered the phone said she was not available. I will try again next week.)   An unsuccessful telephone outreach was attempted today. The patient was referred to the case management team for assistance with care management and care coordination.   Follow Up Plan: The care management team will reach out to the patient again over the next 14 days.   [place]

## 2021-03-09 ENCOUNTER — Encounter (HOSPITAL_COMMUNITY): Payer: 59 | Admitting: Physical Therapy

## 2021-03-14 ENCOUNTER — Encounter (HOSPITAL_COMMUNITY): Payer: 59 | Admitting: Physical Therapy

## 2021-03-15 ENCOUNTER — Telehealth: Payer: Self-pay | Admitting: Internal Medicine

## 2021-03-15 NOTE — Telephone Encounter (Signed)
..   Medicaid Managed Care   Unsuccessful Outreach Note  03/15/2021 Name: Kathryn [name] MRN: [id number] DOB: 1970/11/08  Referred by: Lindell Spar, MD Reason for referral : High Risk Managed Medicaid (2nd attempt to reach patient to get her scheduled for a phone visit with the Pharmacist.)   A second unsuccessful telephone outreach was attempted today. The patient was referred to the case management team for assistance with care management and care coordination.   Follow Up Plan: The care management team will reach out to the patient again over the next 7 days.   [place]

## 2021-03-16 ENCOUNTER — Telehealth: Payer: Self-pay | Admitting: Internal Medicine

## 2021-03-16 ENCOUNTER — Encounter (HOSPITAL_COMMUNITY): Payer: 59

## 2021-03-16 NOTE — Telephone Encounter (Signed)
Pt called in for refill    diazepam (VALIUM) 5 MG tablet   [employer]

## 2021-03-16 NOTE — Telephone Encounter (Signed)
Please let pt know this was prescribed at ER and this will not be refilled

## 2021-03-17 ENCOUNTER — Other Ambulatory Visit: Payer: Self-pay

## 2021-03-17 ENCOUNTER — Encounter: Payer: Self-pay | Admitting: Obstetrics & Gynecology

## 2021-03-17 ENCOUNTER — Ambulatory Visit (INDEPENDENT_AMBULATORY_CARE_PROVIDER_SITE_OTHER): Payer: 59 | Admitting: Obstetrics & Gynecology

## 2021-03-17 VITALS — BP 115/83 | HR 94 | Ht 59.0 in | Wt 204.0 lb

## 2021-03-17 DIAGNOSIS — Z9889 Other specified postprocedural states: Secondary | ICD-10-CM | POA: Diagnosis not present

## 2021-03-17 DIAGNOSIS — N83299 Other ovarian cyst, unspecified side: Secondary | ICD-10-CM

## 2021-03-17 NOTE — Progress Notes (Signed)
Follow up appointment for results  Chief Complaint  Patient presents with   bilateral ovarian cysts, dark brown bleeding, abdominal pain    Blood pressure 115/83, pulse 94, height 4\' 11"  (1.499 m), weight 204 lb (92.5 kg), last menstrual period 06/07/2020.    Narrative & Impression  CLINICAL DATA:  Low back pain with cauda equina syndrome suspected   EXAM: MRI LUMBAR SPINE WITHOUT CONTRAST   TECHNIQUE: Multiplanar, multisequence MR imaging of the lumbar spine was performed. No intravenous contrast was administered.   COMPARISON:  02/24/2020 lumbar MRI.  Abdominal CT 10/07/2020   FINDINGS: Segmentation:  5 lumbar type vertebrae   Alignment:  Physiologic.   Vertebrae:  L3-L5   Conus medullaris and cauda equina: Conus extends to the L1 level. Conus and cauda equina appear normal.   Paraspinal and other soft tissues: Expected postoperative scarring in the posterior lumbar soft tissues. Posterior intramural fibroid measuring 3 cm. Bilateral ovarian cystic intensities, larger on the left and measuring up to 3 cm.   Disc levels:   T12- L1: Unremarkable.   L1-L2: Mild narrowing of the thecal sac from epidural fat   L2-L3: Moderate effacement of the thecal sac primarily due to epidural fat and accentuated by posterior element hypertrophy. Mild disc bulging.   L3-L4: PLIF.   L4-L5: PLIF.   L5-S1:Narrower appearance of the thecal sac than on prior, primarily from epidural fat. The left S1 nerve root appears somewhat posterior displaced but no discrete herniation seen on sagittal images. There is contributing architectural distortion from metallic artifact, especially seen at this level on sagittal images.   IMPRESSION: 1. No definite acute finding. 2. L2-3 moderate thecal sac narrowing from facet spurring and epidural lipomatosis, progressed from 2021. 3. L5-S1 thecal sac effacement from epidural fat with distortion from spinal hardware. 4. Obscured L3-4 and L4-5  levels due to hardware artifact. 5. Bilateral ovarian cysts measuring up to 3 cm on the left. These are new from an abdominal CT June 2022, compatible with functional cysts. No follow-up imaging recommended. Note: This recommendation does not apply to premenarchal patients and to those with increased risk (genetic, family history, elevated tumor markers or other high-risk factors) of ovarian cancer. Reference: JACR 2020 Feb; 17(2):248-254     Electronically Signed   By: Jorje Guild M.D.   On: 01/17/2021 06:09      MEDS ordered this encounter: No orders of the defined types were placed in this encounter.   Orders for this encounter: No orders of the defined types were placed in this encounter.   Impression:   ICD-10-CM   1. Physiological ovarian cysts< MRI images reviewed  N83.299    No further evaluation or follow up is required    2. S/P endometrial ablation, 06/09/20, may have occasional bleeding  Z98.890    Her menses are significantly improved compared to pre operatively       Plan: As above  Follow Up: Return if symptoms worsen or fail to improve.     All questions were answered.  Past Medical History:  Diagnosis Date   Anxiety    Arthritis    Asthma    Bipolar disorder ([place])    Chronic back pain    COPD (chronic obstructive pulmonary disease) (HCC)    Chronic bronchitis   Depression    Dyspnea    GERD (gastroesophageal reflux disease)    Hypertension    Migraine    Neuropathy    Osteoarthritis of left knee, 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  Single subsegmental pulmonary embolism without acute cor pulmonale ([place]) 06/20/2021   Sleep apnea 04/2020   GETTING A cpap   Suicidal ideation 12/24/2018    Past Surgical History:  Procedure Laterality Date   BACK SURGERY     DILATATION AND CURETTAGE/HYSTEROSCOPY WITH MINERVA N/A 06/09/2020   Procedure: DILATATION AND CURETTAGE/HYSTEROSCOPY WITH MINERVA;  Surgeon: Florian Buff, MD;  Location: [facility];   Service: Gynecology;  Laterality: N/A;   ESOPHAGOGASTRODUODENOSCOPY (EGD) WITH PROPOFOL N/A 12/25/2018   Procedure: ESOPHAGOGASTRODUODENOSCOPY (EGD) WITH PROPOFOL;  Surgeon: Rogene Houston, MD;  [facility];  Service: Endoscopy;  Laterality: N/A;   FLEXIBLE SIGMOIDOSCOPY  10/14/2021   Procedure: FLEXIBLE SIGMOIDOSCOPY;  Surgeon: Harvel Quale, MD;  Location: [facility];  Service: Gastroenterology;;   PATELLA-FEMORAL ARTHROPLASTY Left 10/08/2018   Procedure: PATELLA-FEMORAL ARTHROPLASTY;  Surgeon: Marchia Bond, MD;  Location: [facility];  Service: Orthopedics;  Laterality: Left;   TUBAL LIGATION      OB History     Gravida  5   Para  3   Term  2   Preterm  1   AB  2   Living  3      SAB  1   IAB  1   Ectopic      Multiple      Live Births              Allergies  Allergen Reactions   Clonopin [Clonazepam] Anaphylaxis    Swelling of the tongue and mouth.    Fish Allergy Anaphylaxis, Shortness Of Breath and Swelling   Flexeril [Cyclobenzaprine Hcl] Shortness Of Breath   Ibuprofen Anaphylaxis, Hives and Other (See Comments)   Shellfish Allergy Anaphylaxis   Tylenol [Acetaminophen] Anaphylaxis   Ace Inhibitors Cough   Peanut Allergen Powder-Dnfp    Tramadol Nausea And Vomiting    Upset stomach   Trazodone And Nefazodone Hives    Social History   Socioeconomic History   Marital status: Single    Spouse name: Not on file   Number of children: 3   Years of education: 10   Highest education level: 10th grade  Occupational History   Not on file  Tobacco Use   Smoking status: Every Day    Packs/day: 1.00    Years: 10.00    Total pack years: 10.00    Types: Cigarettes    Passive exposure: Never   Smokeless tobacco: Never   Tobacco comments:    Smokes often, especially when using crack cocaine  Vaping Use   Vaping Use: Never used  Substance and Sexual Activity   Alcohol use: Yes    Alcohol/week: 4.0 standard drinks of  alcohol    Types: 4 Cans of beer per week    Comment: 4 beers Saturday   Drug use: Yes    Types: Cocaine    Comment: crack  last used June 2023   Sexual activity: Yes    Birth control/protection: Surgical    Comment: tubal, ablation  Other Topics Concern   Not on file  Social History Narrative   R handed    Lives with boyfriend   1 Cup of caffeine daily    Social Determinants of Health   Financial Resource Strain: Not on file  Food Insecurity: Food Insecurity Present (01/08/2022)   Hunger Vital Sign    Worried About Running Out of Food in the Last Year: Sometimes true    Ran Out of Food in the Last Year: Sometimes true  Transportation Needs: No Transportation Needs (01/08/2022)   PRAPARE - Hydrologist (Medical): No    Lack of Transportation (Non-Medical): No  Physical Activity: Not on file  Stress: Not on file  Social Connections: Not on file    Family History  Problem Relation Age of Onset   Gout Paternal Grandfather    Cirrhosis Paternal Grandfather    Hypertension Paternal Grandmother    Aneurysm Paternal Grandmother    Cirrhosis Maternal Grandmother    Cirrhosis Maternal Grandfather    Cancer Father    Cirrhosis Father    Cirrhosis Mother    Breast cancer Sister    Hypertension Sister    Bronchitis Daughter    Bronchitis Daughter    Asthma Son    Bronchitis Son    Migraines Neg Hx

## 2021-03-18 ENCOUNTER — Institutional Professional Consult (permissible substitution): Payer: 59 | Admitting: Plastic Surgery

## 2021-03-18 ENCOUNTER — Encounter (HOSPITAL_COMMUNITY): Payer: Self-pay | Admitting: *Deleted

## 2021-03-18 ENCOUNTER — Emergency Department (HOSPITAL_COMMUNITY)
Admission: EM | Admit: 2021-03-18 | Discharge: 2021-03-18 | Disposition: A | Discharge status: Home / Self Care | Payer: Medicaid Other | Attending: Emergency Medicine | Admitting: Emergency Medicine

## 2021-03-18 ENCOUNTER — Encounter (HOSPITAL_COMMUNITY): Payer: 59

## 2021-03-18 DIAGNOSIS — Z79899 Other long term (current) drug therapy: Secondary | ICD-10-CM | POA: Insufficient documentation

## 2021-03-18 DIAGNOSIS — J45909 Unspecified asthma, uncomplicated: Secondary | ICD-10-CM | POA: Diagnosis not present

## 2021-03-18 DIAGNOSIS — Z87891 Personal history of nicotine dependence: Secondary | ICD-10-CM | POA: Diagnosis not present

## 2021-03-18 DIAGNOSIS — Z7901 Long term (current) use of anticoagulants: Secondary | ICD-10-CM | POA: Insufficient documentation

## 2021-03-18 DIAGNOSIS — G609 Hereditary and idiopathic neuropathy, unspecified: Secondary | ICD-10-CM | POA: Insufficient documentation

## 2021-03-18 DIAGNOSIS — M549 Dorsalgia, unspecified: Secondary | ICD-10-CM | POA: Insufficient documentation

## 2021-03-18 DIAGNOSIS — I10 Essential (primary) hypertension: Secondary | ICD-10-CM | POA: Insufficient documentation

## 2021-03-18 DIAGNOSIS — J449 Chronic obstructive pulmonary disease, unspecified: Secondary | ICD-10-CM | POA: Diagnosis not present

## 2021-03-18 DIAGNOSIS — G629 Polyneuropathy, unspecified: Secondary | ICD-10-CM

## 2021-03-18 MED ORDER — OXYCODONE HCL 5 MG PO TABS
10.0000 mg | ORAL_TABLET | Freq: Once | ORAL | Status: AC
  Administered 2021-03-18: 10 mg via ORAL
  Filled 2021-03-18: qty 2

## 2021-03-18 MED ORDER — PREGABALIN 75 MG PO CAPS: 75.0000 mg | ORAL_CAPSULE | Freq: Two times a day (BID) | ORAL | 0 refills | Status: DC

## 2021-03-18 NOTE — Discharge Instructions (Addendum)
I have refilled your Lyrica.  Please follow-up with your primary care provider.  Please return to the emergency department if you experience worsening pain, worsening neuropathy, or any other concerns you might have.

## 2021-03-18 NOTE — ED Triage Notes (Signed)
Repeat visit for pain in left flank area, seen here on 11/18 for same, also states she has fluid in her feet and legs

## 2021-03-18 NOTE — ED Provider Notes (Signed)
[facility] EMERGENCY DEPARTMENT Provider Note   CSN: [id number] Arrival date & time: 03/18/21  1538     History Chief Complaint  Patient presents with   left side pain    Kathryn [name] is a 50 y.o. female who presents to the emergency department for medication refill and chronic ongoing pain.  Patient states that she has been out of her Lyrica for the last 2 days.  She is in the emergency department complaining of neuropathy type symptoms.  He describes a burning sensation to her lower extremities which are extremely painful.  This has been ongoing for some time.  She also complains of back pain which is also been ongoing.  She denies any new or changes in her neuropathy or back pain.  She tried talking with her doctor to get her Lyrica refilled and she states that it was faxed over but upon going to the pharmacy it never made it there.  She denies any fever, chills, weakness to her lower legs, abdominal pain, nausea, vomiting, diarrhea.  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   Encounter for examination following treatment at hospital 01/17/2021   Preoperative examination 01/17/2021   Bilateral leg edema 10/18/2020   Morbid obesity ([place]) 10/18/2020   S/P lumbar fusion 09/14/2020   History of DVT of lower extremity 09/14/2020   Spondylolisthesis at L4-L5 level  03/29/2020   Insomnia 03/19/2020   Mild persistent asthma without complication 83/15/1761   Need for immunization against influenza 03/19/2020   Body mass index (BMI) 38.0-38.9, adult 03/19/2020   GERD (gastroesophageal reflux disease) 03/19/2020   Intermittent palpitations 12/17/2019   Snoring 12/17/2019   Excessive daytime sleepiness 12/17/2019   Nightmare disorder, 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   Enlarged uterus 02/20/2018   Screening for colorectal cancer 02/20/2018   OA (osteoarthritis) of knee 12/27/2017   MDD (major depressive disorder), recurrent episode, severe ([place]) 12/24/2017   Lumbar spinal stenosis 05/11/2017   Low vitamin B12 level 05/11/2017   Hypertension 01/11/2017    Past Surgical History:  Procedure Laterality Date   BACK SURGERY     DILATATION AND CURETTAGE/HYSTEROSCOPY WITH MINERVA N/A 06/09/2020   Procedure: DILATATION AND CURETTAGE/HYSTEROSCOPY WITH MINERVA;  Surgeon: Florian Buff, MD;  Location: [facility];  Service: Gynecology;  Laterality: N/A;   ESOPHAGOGASTRODUODENOSCOPY (EGD) WITH PROPOFOL N/A 12/25/2018   Procedure: ESOPHAGOGASTRODUODENOSCOPY (EGD) WITH PROPOFOL;  Surgeon: Rogene Houston, MD;  Location: [facility];  Service: Endoscopy;  Laterality: N/A;   PATELLA-FEMORAL ARTHROPLASTY Left 10/08/2018   Procedure: PATELLA-FEMORAL ARTHROPLASTY;  Surgeon: Marchia Bond, MD;  Location: [facility];  Service: Orthopedics;  Laterality: Left;   TUBAL LIGATION       OB History     Gravida  5  Para  3   Term  2   Preterm  1   AB  2   Living  3      SAB  1   IAB  1   Ectopic      Multiple      Live Births              Family History  Problem Relation Age of Onset   Gout Paternal Grandfather    Cirrhosis Paternal Grandfather    Hypertension Paternal Grandmother    Aneurysm Paternal Grandmother    Cirrhosis Maternal Grandmother    Cirrhosis Maternal Grandfather    Cancer Father    Cirrhosis Father    Cirrhosis Mother    Breast  cancer Sister    Hypertension Sister    Bronchitis Daughter    Bronchitis Daughter    Asthma Son    Bronchitis Son    Migraines Neg Hx     Social History   Tobacco Use   Smoking status: Former    Types: Cigarettes    Quit date: 2016    Years since quitting: 6.9   Smokeless tobacco: Never  Vaping Use   Vaping Use: Never used  Substance Use Topics   Alcohol use: Not Currently   Drug use: Not Currently    Types: Cocaine    Comment: crack  last used 2016    Home Medications Prior to Admission medications   Medication Sig Start Date End Date Taking? Authorizing Provider  pregabalin (LYRICA) 75 MG capsule Take 1 capsule (75 mg total) by mouth 2 (two) times daily. 03/18/21  Yes Raul Del, Delvina Mizzell M, PA-C  albuterol (PROVENTIL) (2.5 MG/3ML) 0.083% nebulizer solution Take 2.5 mg by nebulization every 6 (six) hours as needed for wheezing or shortness of breath.    [provider]  amLODipine (NORVASC) 5 MG tablet TAKE 1 TABLET BY MOUTH ONCE A DAY FOR BLOOD PRESSURE. Patient taking differently: Take 5 mg by mouth daily. 10/26/20   Lindell Spar, MD  apixaban (ELIQUIS) 5 MG TABS tablet Take 1 tablet (5 mg total) by mouth 2 (two) times daily. 02/22/21   Arrien, Jimmy Picket, MD  budesonide-formoterol [facility]) 80-4.5 MCG/ACT inhaler Inhale 2 puffs into the lungs 2 (two) times daily. 03/19/20   Lindell Spar, MD  Camphor-Menthol-Methyl Sal (SALONPAS) 3.04-22-08 % PTCH Apply 1 patch topically as needed (pain).    [provider]  Cholecalciferol (VITAMIN D) 50 MCG (2000 UT) CAPS Take 1 capsule by mouth daily.    [provider]  citalopram (CELEXA) 20 MG tablet TAKE ONE TABLET BY MOUTH ONCE DAILY. Patient taking differently: Take 20 mg by mouth daily. 12/27/20   Lindell Spar, MD  cloNIDine (CATAPRES) 0.1 MG tablet TAKE 1 TABLET BY MOUTH ONCE DAILY FOR BLOOD PRESSURE AND ANXIETY. Patient taking differently: Take 0.1 mg by mouth daily. 01/13/21   Lindell Spar, MD   Cyanocobalamin (VITAMIN B 12 PO) Take 1 tablet by mouth daily.    [provider]  diazepam (VALIUM) 5 MG tablet Take 1 tablet (5 mg total) by mouth 2 (two) times daily. 03/04/21   [name], MD  DULoxetine (CYMBALTA) 30 MG capsule Take 30 mg by mouth 2 (two) times daily. 11/26/20   [provider]  furosemide (LASIX) 20 MG tablet Take 1 tablet (20 mg total) by mouth daily. 02/22/21 03/24/21  Arrien, Jimmy Picket, MD  HYDROcodone-acetaminophen (NORCO/VICODIN) 5-325 MG tablet Take 1 tablet by mouth every 4 (four)  hours as needed. 03/04/21   [name], MD  hydrocortisone (ANUSOL-HC) 2.5 % rectal cream Place 1 application rectally 2 (two) times daily. 11/18/20   Lindell Spar, MD  hydrOXYzine (VISTARIL) 50 MG capsule Take 1 capsule (50 mg total) by mouth daily as needed for anxiety (Insomnia). 11/18/20   Lindell Spar, MD  oxyCODONE (OXY IR/ROXICODONE) 5 MG immediate release tablet Take 5 mg by mouth every 4 (four) hours as needed for severe pain.    [provider]  potassium chloride SA (KLOR-CON) 20 MEQ tablet Take 1 tablet (20 mEq total) by mouth daily. 02/22/21   Arrien, Jimmy Picket, MD  pregabalin (LYRICA) 75 MG capsule Take 1 capsule (75 mg total) by mouth 2 (two) times daily. 01/17/21   Lindell Spar, MD  tiZANidine (ZANAFLEX) 2 MG tablet TAKE 1 TABLET BY MOUTH TWICE DAILY AS NEEDED FOR MUSCLE SPASMS Patient taking differently: Take 2 mg by mouth 2 (two) times daily as needed for muscle spasms. 02/04/21   Lindell Spar, MD    Allergies    Clonopin [clonazepam], Fish allergy, Flexeril [cyclobenzaprine hcl], Ibuprofen, Shellfish allergy, Tylenol [acetaminophen], Ace inhibitors, Cyclobenzaprine, Tramadol, and Trazodone and nefazodone  Review of Systems   Review of Systems  All other systems reviewed and are negative.  Physical Exam Updated Vital Signs BP (!) 140/106   Pulse 84   Temp 98 F (36.7 C) (Oral)   Resp 18   LMP 06/07/2020   SpO2  95%   Physical Exam Vitals and nursing note reviewed.  Constitutional:      Appearance: Normal appearance.  HENT:     Head: Normocephalic and atraumatic.  Eyes:     General:        Right eye: No discharge.        Left eye: No discharge.     Conjunctiva/sclera: Conjunctivae normal.  Cardiovascular:     Pulses:          Radial pulses are 2+ on the right side and 2+ on the left side.       Dorsalis pedis pulses are 2+ on the right side and 2+ on the left side.  Pulmonary:     Effort: Pulmonary effort is normal.  Musculoskeletal:     Comments: No cervical, thoracic, or lumbar spinal tenderness.  Tenderness palpation over the bilateral lower extremities.  Skin:    General: Skin is warm and dry.     Findings: No rash.  Neurological:     General: No focal deficit present.     Mental Status: She is alert.  Psychiatric:        Mood and Affect: Mood normal.        Behavior: Behavior normal.    ED Results / Procedures / Treatments   Labs (all labs ordered are listed, but only abnormal results are displayed) Labs Reviewed - No data to display  EKG None  Radiology No results found.  Procedures Procedures   Medications Ordered in ED Medications  oxyCODONE (Oxy IR/ROXICODONE) immediate release tablet 10 mg (10 mg Oral Given 03/18/21 2001)    ED Course  I have reviewed the triage vital signs and the nursing notes.  Pertinent labs & imaging results that were available during my care of the patient were reviewed by me and considered in my medical decision making (see chart for details).    MDM Rules/Calculators/A&P  PAETYN PIETRZAK is a 50 y.o. female who presents emergency department for medication refill and chronic ongoing neuropathy and back pain.  Patient requests a refill of her Lyrica.  Patient was sleeping comfortably upon me entering her room. Do not feel that imaging or lab work is necessary at this time as she is having no new symptoms.   This feels exactly like her pain has been ongoing for a while.  Will give her 10 mg of oxycodone in the department and a prescription for Lyrica upon discharge.  Will observe for short time to ensure appropriate analgesia.  On reevaluation, patient is feeling much improved and wishes to go home.  She normally takes 75 mg of Lyrica which I will refill.  I will have her follow-up with her primary care provider for further evaluation.  She is safe to discharge.  Final Clinical Impression(s) / ED Diagnoses Final diagnoses:  Neuropathy    Rx / DC Orders ED Discharge Orders          Ordered    pregabalin (LYRICA) 75 MG capsule  2 times daily        03/18/21 2034             Hendricks Limes, PA-C 03/18/21 2344    Daleen Bo, MD 03/19/21 1435

## 2021-03-18 NOTE — ED Notes (Signed)
Pt states that she is out of her pain meds along with some Neurontin ,

## 2021-03-21 ENCOUNTER — Encounter (HOSPITAL_COMMUNITY): Payer: 59 | Admitting: Physical Therapy

## 2021-03-22 ENCOUNTER — Emergency Department (HOSPITAL_COMMUNITY)
Admission: EM | Admit: 2021-03-22 | Discharge: 2021-03-22 | Disposition: A | Discharge status: Home / Self Care | Payer: 59 | Attending: Emergency Medicine | Admitting: Emergency Medicine

## 2021-03-22 ENCOUNTER — Emergency Department (HOSPITAL_COMMUNITY): Payer: 59

## 2021-03-22 ENCOUNTER — Encounter (HOSPITAL_COMMUNITY): Payer: Self-pay | Admitting: Emergency Medicine

## 2021-03-22 ENCOUNTER — Other Ambulatory Visit: Payer: Self-pay

## 2021-03-22 DIAGNOSIS — I10 Essential (primary) hypertension: Secondary | ICD-10-CM | POA: Diagnosis not present

## 2021-03-22 DIAGNOSIS — M79661 Pain in right lower leg: Secondary | ICD-10-CM | POA: Diagnosis present

## 2021-03-22 DIAGNOSIS — W108XXA Fall (on) (from) other stairs and steps, initial encounter: Secondary | ICD-10-CM | POA: Diagnosis not present

## 2021-03-22 DIAGNOSIS — M25552 Pain in left hip: Secondary | ICD-10-CM | POA: Diagnosis not present

## 2021-03-22 DIAGNOSIS — Z7951 Long term (current) use of inhaled steroids: Secondary | ICD-10-CM | POA: Diagnosis not present

## 2021-03-22 DIAGNOSIS — W19XXXA Unspecified fall, initial encounter: Secondary | ICD-10-CM

## 2021-03-22 DIAGNOSIS — M25551 Pain in right hip: Secondary | ICD-10-CM | POA: Insufficient documentation

## 2021-03-22 DIAGNOSIS — J4530 Mild persistent asthma, uncomplicated: Secondary | ICD-10-CM | POA: Diagnosis not present

## 2021-03-22 DIAGNOSIS — M79662 Pain in left lower leg: Secondary | ICD-10-CM | POA: Insufficient documentation

## 2021-03-22 DIAGNOSIS — J449 Chronic obstructive pulmonary disease, unspecified: Secondary | ICD-10-CM | POA: Diagnosis not present

## 2021-03-22 DIAGNOSIS — Z7901 Long term (current) use of anticoagulants: Secondary | ICD-10-CM | POA: Diagnosis not present

## 2021-03-22 DIAGNOSIS — Z87891 Personal history of nicotine dependence: Secondary | ICD-10-CM | POA: Insufficient documentation

## 2021-03-22 DIAGNOSIS — M25571 Pain in right ankle and joints of right foot: Secondary | ICD-10-CM | POA: Diagnosis not present

## 2021-03-22 DIAGNOSIS — Z79899 Other long term (current) drug therapy: Secondary | ICD-10-CM | POA: Insufficient documentation

## 2021-03-22 DIAGNOSIS — M25572 Pain in left ankle and joints of left foot: Secondary | ICD-10-CM | POA: Diagnosis not present

## 2021-03-22 MED ORDER — HYDROMORPHONE HCL 1 MG/ML IJ SOLN
1.0000 mg | Freq: Once | INTRAMUSCULAR | Status: AC
  Administered 2021-03-22: 1 mg via INTRAMUSCULAR
  Filled 2021-03-22: qty 1

## 2021-03-22 MED ORDER — OXYCODONE HCL 5 MG PO TABS: 5.0000 mg | ORAL_TABLET | Freq: Four times a day (QID) | ORAL | 0 refills | Status: DC | PRN

## 2021-03-22 MED ORDER — METHOCARBAMOL 500 MG PO TABS: 500.0000 mg | ORAL_TABLET | Freq: Two times a day (BID) | ORAL | 0 refills | Status: DC

## 2021-03-22 MED ORDER — METHOCARBAMOL 500 MG PO TABS
500.0000 mg | ORAL_TABLET | Freq: Once | ORAL | Status: AC
  Administered 2021-03-22: 500 mg via ORAL
  Filled 2021-03-22: qty 1

## 2021-03-22 NOTE — ED Provider Notes (Signed)
[facility] Provider Note   CSN: [id number] Arrival date & time: 03/22/21  4098     History Chief Complaint  Patient presents with   Kathryn [name]    Kathryn [name] is a 50 y.o. female.  With past medical history of chronic back pain, COPD, osteoarthritis of her left knee, hypertension who presents emergency department with fall.  She states that this morning she was going back down the stairs after doing her laundry when she lost balance and fell approximately 8 stairs.  She states that her "right leg felt like it gave out" and she fell.  She denies hitting her head or loss of consciousness.  She is anticoagulated on Eliquis.  She states that she "hurts all over."  She states primarily her left ankle, low back and hips are hurting.  Denies abdominal pain, chest pain.   Fall Pertinent negatives include no chest pain, no abdominal pain and no shortness of breath.      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   Encounter for examination following treatment at hospital 01/17/2021   Preoperative examination 01/17/2021   Bilateral leg edema 10/18/2020   Morbid obesity ([place]) 10/18/2020   S/P lumbar fusion 09/14/2020   History of DVT of lower extremity 09/14/2020   Spondylolisthesis at L4-L5 level 03/29/2020   Insomnia 03/19/2020   Mild persistent asthma  without complication 11/91/4782   Need for immunization against influenza 03/19/2020   Body mass index (BMI) 38.0-38.9, adult 03/19/2020   GERD (gastroesophageal reflux disease) 03/19/2020   Intermittent palpitations 12/17/2019   Snoring 12/17/2019   Excessive daytime sleepiness 12/17/2019   Nightmare disorder, 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   Enlarged uterus 02/20/2018   Screening for colorectal cancer 02/20/2018   OA (osteoarthritis) of knee 12/27/2017   MDD (major depressive disorder), recurrent episode, severe ([place]) 12/24/2017   Lumbar spinal stenosis 05/11/2017   Low vitamin B12 level 05/11/2017   Hypertension 01/11/2017    Past Surgical History:  Procedure Laterality Date   BACK SURGERY     DILATATION AND CURETTAGE/HYSTEROSCOPY WITH MINERVA N/A 06/09/2020   Procedure: DILATATION AND CURETTAGE/HYSTEROSCOPY WITH MINERVA;  Surgeon: Florian Buff, MD;  Location: [facility];  Service: Gynecology;  Laterality: N/A;   ESOPHAGOGASTRODUODENOSCOPY (EGD) WITH PROPOFOL N/A 12/25/2018   Procedure: ESOPHAGOGASTRODUODENOSCOPY (EGD) WITH PROPOFOL;  Surgeon: Rogene Houston, MD;  Location: [facility];  Service: Endoscopy;  Laterality: N/A;   PATELLA-FEMORAL ARTHROPLASTY Left 10/08/2018   Procedure: PATELLA-FEMORAL ARTHROPLASTY;  Surgeon: Marchia Bond, MD;  Location: [facility];  Service: Orthopedics;  Laterality: Left;   TUBAL LIGATION       OB History     Gravida  5   Para  3   Term  2   Preterm  1  AB  2   Living  3      SAB  1   IAB  1   Ectopic      Multiple      Live Births              Family History  Problem Relation Age of Onset   Gout Paternal Grandfather    Cirrhosis Paternal Grandfather    Hypertension Paternal Grandmother    Aneurysm Paternal Grandmother    Cirrhosis Maternal Grandmother    Cirrhosis Maternal Grandfather    Cancer Father    Cirrhosis Father    Cirrhosis Mother    Breast cancer Sister    Hypertension Sister    Bronchitis  Daughter    Bronchitis Daughter    Asthma Son    Bronchitis Son    Migraines Neg Hx     Social History   Tobacco Use   Smoking status: Former    Types: Cigarettes    Quit date: 2016    Years since quitting: 6.9   Smokeless tobacco: Never  Vaping Use   Vaping Use: Never used  Substance Use Topics   Alcohol use: Not Currently   Drug use: Not Currently    Types: Cocaine    Comment: crack  last used 2016    Home Medications Prior to Admission medications   Medication Sig Start Date End Date Taking? Authorizing Provider  albuterol (PROVENTIL) (2.5 MG/3ML) 0.083% nebulizer solution Take 2.5 mg by nebulization every 6 (six) hours as needed for wheezing or shortness of breath.    [provider]  amLODipine (NORVASC) 5 MG tablet TAKE 1 TABLET BY MOUTH ONCE A DAY FOR BLOOD PRESSURE. Patient taking differently: Take 5 mg by mouth daily. 10/26/20   Lindell Spar, MD  apixaban (ELIQUIS) 5 MG TABS tablet Take 1 tablet (5 mg total) by mouth 2 (two) times daily. 02/22/21   Arrien, Jimmy Picket, MD  budesonide-formoterol [facility]) 80-4.5 MCG/ACT inhaler Inhale 2 puffs into the lungs 2 (two) times daily. 03/19/20   Lindell Spar, MD  Camphor-Menthol-Methyl Sal (SALONPAS) 3.04-22-08 % PTCH Apply 1 patch topically as needed (pain).    [provider]  Cholecalciferol (VITAMIN D) 50 MCG (2000 UT) CAPS Take 1 capsule by mouth daily.    [provider]  citalopram (CELEXA) 20 MG tablet TAKE ONE TABLET BY MOUTH ONCE DAILY. Patient taking differently: Take 20 mg by mouth daily. 12/27/20   Lindell Spar, MD  cloNIDine (CATAPRES) 0.1 MG tablet TAKE 1 TABLET BY MOUTH ONCE DAILY FOR BLOOD PRESSURE AND ANXIETY. Patient taking differently: Take 0.1 mg by mouth daily. 01/13/21   Lindell Spar, MD  Cyanocobalamin (VITAMIN B 12 PO) Take 1 tablet by mouth daily.    [provider]  diazepam (VALIUM) 5 MG tablet Take 1 tablet (5 mg total) by mouth 2 (two) times daily.  03/04/21   [name], MD  DULoxetine (CYMBALTA) 30 MG capsule Take 30 mg by mouth 2 (two) times daily. 11/26/20   [provider]  furosemide (LASIX) 20 MG tablet Take 1 tablet (20 mg total) by mouth daily. 02/22/21 03/24/21  Arrien, Jimmy Picket, MD  HYDROcodone-acetaminophen (NORCO/VICODIN) 5-325 MG tablet Take 1 tablet by mouth every 4 (four) hours as needed. 03/04/21   [name], MD  hydrocortisone (ANUSOL-HC) 2.5 % rectal cream Place 1 application rectally 2 (two) times daily. 11/18/20   Lindell Spar, MD  hydrOXYzine (VISTARIL) 50 MG capsule Take 1 capsule (  50 mg total) by mouth daily as needed for anxiety (Insomnia). 11/18/20   Lindell Spar, MD  oxyCODONE (OXY IR/ROXICODONE) 5 MG immediate release tablet Take 5 mg by mouth every 4 (four) hours as needed for severe pain.    [provider]  potassium chloride SA (KLOR-CON) 20 MEQ tablet Take 1 tablet (20 mEq total) by mouth daily. 02/22/21   Arrien, Jimmy Picket, MD  pregabalin (LYRICA) 75 MG capsule Take 1 capsule (75 mg total) by mouth 2 (two) times daily. 01/17/21   Lindell Spar, MD  pregabalin (LYRICA) 75 MG capsule Take 1 capsule (75 mg total) by mouth 2 (two) times daily. 03/18/21   Myna Bright M, PA-C  tiZANidine (ZANAFLEX) 2 MG tablet TAKE 1 TABLET BY MOUTH TWICE DAILY AS NEEDED FOR MUSCLE SPASMS Patient taking differently: Take 2 mg by mouth 2 (two) times daily as needed for muscle spasms. 02/04/21   Lindell Spar, MD    Allergies    Clonopin [clonazepam], Fish allergy, Flexeril [cyclobenzaprine hcl], Ibuprofen, Shellfish allergy, Tylenol [acetaminophen], Ace inhibitors, Cyclobenzaprine, Tramadol, and Trazodone and nefazodone  Review of Systems   Review of Systems  Respiratory:  Negative for shortness of breath.   Cardiovascular:  Negative for chest pain.  Gastrointestinal:  Negative for abdominal pain.  Musculoskeletal:  Positive for arthralgias, back pain, gait problem, myalgias and neck  pain.  Neurological:  Negative for dizziness and light-headedness.  All other systems reviewed and are negative.  Physical Exam Updated Vital Signs BP 124/83   Pulse 96   Temp (!) 97.5 F (36.4 C)   Resp 18   Ht 4\' 11"  (1.499 m)   Wt 95.3 kg   LMP 06/07/2020   SpO2 100%   BMI 42.41 kg/m   Physical Exam Vitals and nursing note reviewed.  Constitutional:      Appearance: Normal appearance. She is obese.  HENT:     Head: Normocephalic and atraumatic.     Nose: Nose normal.     Mouth/Throat:     Mouth: Mucous membranes are moist.     Pharynx: Oropharynx is clear.  Eyes:     General: No scleral icterus. Cardiovascular:     Pulses: Normal pulses.  Pulmonary:     Effort: Pulmonary effort is normal. No respiratory distress.  Abdominal:     General: There is no distension.     Palpations: Abdomen is soft.     Tenderness: There is no abdominal tenderness.  Musculoskeletal:        General: Tenderness present. No swelling.     Cervical back: Normal range of motion. Tenderness present. Spinous process tenderness present.     Right hip: Tenderness present. No deformity.     Left hip: Tenderness present. No deformity.     Right upper leg: Tenderness present. No swelling or deformity.     Left upper leg: Tenderness present. No swelling or deformity.     Right knee: No swelling or deformity. Tenderness present.     Left knee: No swelling or deformity. Tenderness present.     Right lower leg: Tenderness present. No swelling or deformity. No edema.     Left lower leg: Tenderness present. No swelling or deformity. No edema.     Right ankle: No swelling, deformity or ecchymosis. Tenderness present.     Left ankle: No swelling, deformity or ecchymosis. Tenderness present.  Skin:    General: Skin is warm and dry.     Capillary Refill: Capillary refill takes  less than 2 seconds.     Findings: No bruising or rash.  Neurological:     General: No focal deficit present.     Mental  Status: She is alert and oriented to person, place, and time. Mental status is at baseline.  Psychiatric:        Mood and Affect: Mood normal.        Behavior: Behavior normal.        Thought Content: Thought content normal.        Judgment: Judgment normal.    ED Results / Procedures / Treatments   Labs (all labs ordered are listed, but only abnormal results are displayed) Labs Reviewed - No data to display  EKG None  Radiology DG Cervical Spine 2 or 3 views  Result Date: 03/22/2021 CLINICAL DATA:  [place] down the stairs.  Neck pain. EXAM: CERVICAL SPINE - 2-3 VIEW COMPARISON:  09/19/2017 FINDINGS: Limited lateral views because of patient's size. Frontal view does not show any fracture or malalignment. IMPRESSION: No fracture seen. Limited views in the lateral projection because of body habitus. If concern persists, consider cervical CT. Electronically Signed   By: Nelson Chimes M.D.   On: 03/22/2021 11:22   DG Lumbar Spine Complete  Result Date: 03/22/2021 CLINICAL DATA:  [place] down the stairs.  Back pain. EXAM: LUMBAR SPINE - COMPLETE 4+ VIEW COMPARISON:  01/17/2021 FINDINGS: Previous discectomy and fusion procedure from L3 through L5. Components appear well positioned. No evidence of hardware complication. No evidence of regional fracture. No significant degenerative changes above or below the fusion. IMPRESSION: No acute or traumatic finding. Good appearance following discectomy and fusion from L3 through L5. Electronically Signed   By: Nelson Chimes M.D.   On: 03/22/2021 11:23   DG Ankle Complete Left  Result Date: 03/22/2021 CLINICAL DATA:  [place] down the stairs.  Ankle pain. EXAM: LEFT ANKLE COMPLETE - 3+ VIEW COMPARISON:  None. FINDINGS: There is no evidence of fracture, dislocation, or joint effusion. There is no evidence of arthropathy or other focal bone abnormality. Tiny calcification inferior to the tip of the fibula probably relates to an old avulsion injury given the relative  lack of soft tissue swelling. Tiny distal fibular avulsion fracture not completely excluded. Soft tissues are unremarkable. IMPRESSION: Tiny calcification inferior to the tip of the fibula favored to represent evidence of an old ligamentous injury. Cannot rule out small acute avulsion fracture, though there does not appear to be significant soft tissue swelling in the region. Electronically Signed   By: Nelson Chimes M.D.   On: 03/22/2021 11:21   DG HIPS BILAT WITH PELVIS 3-4 VIEWS  Result Date: 03/22/2021 CLINICAL DATA:  [place] down the stairs with pelvic and hip pain EXAM: DG HIP (WITH OR WITHOUT PELVIS) 3-4V BILAT COMPARISON:  None. FINDINGS: There is no evidence of hip fracture or dislocation. There is no evidence of arthropathy or other focal bone abnormality. IMPRESSION: Negative. Electronically Signed   By: Nelson Chimes M.D.   On: 03/22/2021 11:19    Procedures Procedures   Medications Ordered in ED Medications  methocarbamol (ROBAXIN) tablet 500 mg (has no administration in time range)  HYDROmorphone (DILAUDID) injection 1 mg (1 mg Intramuscular Given 03/22/21 0948)   ED Course  I have reviewed the triage vital signs and the nursing notes.  Pertinent labs & imaging results that were available during my care of the patient were reviewed by me and considered in my medical decision making (see chart for details).  MDM Rules/Calculators/A&P 50 year old female who presents the emergency department with mechanical fall.  She denies chest pain, shortness of breath, lightheadedness or dizziness, palpitations prior to or after the fall. She is anticoagulated on Eliquis however over the past 4 hours with serial exams she has had no change in mental status.  She is alert and oriented.  She denies headache.  She is also continue to not complain about chest or abdominal pain.  Her abdomen is soft on serial exams.  I have low suspicion that she has intracranial hemorrhage or acute intrathoracic or  abdominal injuries. All of her plain films of C-spine, L-spine, left ankle and bilateral hips are negative. Will provide her with a ankle brace for her left ankle. Will give prescription for very short course of pain medication.  It appears that she has been on opiates prior, however it appears she has been given short courses over the past few months.  Should not have excessive supply at home.  We will also provide with muscle relaxant.  She is instructed to not use these at the same time. Safe for discharge. Final Clinical Impression(s) / ED Diagnoses Final diagnoses:  Fall, initial encounter    Rx / DC Orders ED Discharge Orders          Ordered    oxyCODONE (ROXICODONE) 5 MG immediate release tablet  Every 6 hours PRN        03/22/21 1158    methocarbamol (ROBAXIN) 500 MG tablet  2 times daily        03/22/21 1158             Mickie Hillier, PA-C 03/22/21 1200    Sherwood Gambler, MD 03/24/21 [phone]

## 2021-03-22 NOTE — Discharge Instructions (Addendum)
You are seen in the Emergency Department today for fall.  While you are here we did x-rays which were all negative for any fractures.  I am giving you an ankle brace for your left ankle as it is particularly painful and may provide you some comfort.  Please continue to use ice and rest as needed.  You will be sore over the next coming days.  This is expected.  I provided you with some pain medication.  You have been prescribed a medication that is considered an opiate. Opiates are pain medications that should be used with caution. It is important that you do not drive while taking this medication as it can cause drowsiness and impaired reaction times. Do not mix this medication with benzodiazepine medications or alcohol as this can cause respiratory depression.  Additionally do not mix this medication with muscle relaxant such as Robaxin, Flexeril.  Additionally, opiates have addicting properties to them. Please use medication as prescribed by your provider.  Please return to the emergency department if you have inability to walk, increasing or worsening headache

## 2021-03-22 NOTE — ED Triage Notes (Signed)
Pt states she slipped and rolled down approximately 15 steps. C/o generalized pain worse in back and bilateral knees. Denies hitting head or loc.

## 2021-03-23 ENCOUNTER — Encounter (HOSPITAL_COMMUNITY): Payer: 59

## 2021-03-23 ENCOUNTER — Encounter (HOSPITAL_COMMUNITY): Payer: Self-pay | Admitting: Radiology

## 2021-03-24 ENCOUNTER — Encounter: Payer: Self-pay | Admitting: Internal Medicine

## 2021-03-24 ENCOUNTER — Ambulatory Visit (INDEPENDENT_AMBULATORY_CARE_PROVIDER_SITE_OTHER): Payer: 59 | Admitting: Internal Medicine

## 2021-03-24 ENCOUNTER — Other Ambulatory Visit: Payer: Self-pay

## 2021-03-24 VITALS — BP 158/86 | HR 99 | Resp 18 | Ht 59.0 in | Wt 202.1 lb

## 2021-03-24 DIAGNOSIS — Z981 Arthrodesis status: Secondary | ICD-10-CM

## 2021-03-24 DIAGNOSIS — F411 Generalized anxiety disorder: Secondary | ICD-10-CM | POA: Insufficient documentation

## 2021-03-24 DIAGNOSIS — E559 Vitamin D deficiency, unspecified: Secondary | ICD-10-CM

## 2021-03-24 DIAGNOSIS — Z0001 Encounter for general adult medical examination with abnormal findings: Secondary | ICD-10-CM | POA: Diagnosis not present

## 2021-03-24 DIAGNOSIS — G4700 Insomnia, unspecified: Secondary | ICD-10-CM

## 2021-03-24 DIAGNOSIS — I10 Essential (primary) hypertension: Secondary | ICD-10-CM

## 2021-03-24 DIAGNOSIS — F332 Major depressive disorder, recurrent severe without psychotic features: Secondary | ICD-10-CM

## 2021-03-24 DIAGNOSIS — K219 Gastro-esophageal reflux disease without esophagitis: Secondary | ICD-10-CM

## 2021-03-24 DIAGNOSIS — Z23 Encounter for immunization: Secondary | ICD-10-CM | POA: Diagnosis not present

## 2021-03-24 DIAGNOSIS — J4530 Mild persistent asthma, uncomplicated: Secondary | ICD-10-CM

## 2021-03-24 MED ORDER — AMLODIPINE BESYLATE 5 MG PO TABS
5.0000 mg | ORAL_TABLET | Freq: Every day | ORAL | 0 refills | Status: DC
Start: 2021-03-24 — End: 2021-07-04

## 2021-03-24 MED ORDER — MISC. DEVICES MISC: 0 refills | Status: DC

## 2021-03-24 MED ORDER — HYDROXYZINE PAMOATE 50 MG PO CAPS: 50.0000 mg | ORAL_CAPSULE | Freq: Every day | ORAL | 5 refills | Status: DC | PRN

## 2021-03-24 MED ORDER — MOMETASONE FURO-FORMOTEROL FUM 100-5 MCG/ACT IN AERO: 2.0000 | INHALATION_SPRAY | Freq: Two times a day (BID) | RESPIRATORY_TRACT | 5 refills | Status: DC

## 2021-03-24 MED ORDER — OMEPRAZOLE 20 MG PO CPDR: 20.0000 mg | DELAYED_RELEASE_CAPSULE | Freq: Every day | ORAL | 3 refills | Status: DC

## 2021-03-24 MED ORDER — DIAZEPAM 5 MG PO TABS: 5.0000 mg | ORAL_TABLET | Freq: Every evening | ORAL | 0 refills | Status: DC | PRN

## 2021-03-24 MED ORDER — CLONIDINE HCL 0.1 MG PO TABS: 0.1000 mg | ORAL_TABLET | Freq: Every day | ORAL | 0 refills | Status: DC

## 2021-03-24 MED ORDER — DULOXETINE HCL 30 MG PO CPEP: 30.0000 mg | ORAL_CAPSULE | Freq: Two times a day (BID) | ORAL | 5 refills | Status: DC

## 2021-03-24 NOTE — Addendum Note (Signed)
Addended by: Zacarias Pontes R on: 03/24/2021 12:02 PM   Modules accepted: Orders

## 2021-03-24 NOTE — Patient Instructions (Addendum)
Please continue taking medications as prescribed.  Please follow low salt diet and ambulate as tolerated.  You are being referred to Behavioral health clinic.

## 2021-03-24 NOTE — Assessment & Plan Note (Signed)
On Omeprazole, refilled

## 2021-03-24 NOTE — Assessment & Plan Note (Signed)
[place] Office Visit from 01/17/2021 in [facility]  PHQ-9 Total Score 0     On Cymbalta, DC Celexa from the list Has severe anxiety, refilled Vistaril and Valium for now Referred to Psychiatry

## 2021-03-24 NOTE — Progress Notes (Signed)
Established Patient Office Visit  Subjective:  Patient ID: Kathryn [name], female    DOB: July 11, 1970  Age: 50 y.o. MRN: [id number]  CC:  Chief Complaint  Patient presents with   Annual Exam    Annual exam needs med refills     HPI Kathryn [name] is a 50 y.o. female with past medical history of hypertension, asthma, GERD, insomnia, mild sleep apnea, depression, chronic low back pain and knee pain, and obesity who presents for annual physical.  She has been taking Eliquis for DVT. Denies any active bleeding.  Her BP was elevated today and has run out of her Amlodipine.  She denies any headache, dizziness, chest pain, dyspnea or palpitations.  She has run out of her Celexa, but she was placed on Cymbalta by [facility] neurology.  She is advised to continue Cymbalta for now.  She also complains of severe anxiety, for which she was given Vistaril.  She was started on Valium from inpatient psychiatry.  She asked for refill of Valium.  She agrees to see psychiatry in the outpatient setting.  She had a fall at home 2 days ago and went to ER.  She was given Robaxin for possible ankle sprain.  She feels better today.  She uses a cane for support.  She asks for a rolling walker.  She received second dose of Shingrix vaccine in the office today.  Past Medical History:  Diagnosis Date   Anxiety    Arthritis    Asthma    Bipolar disorder ([place])    Chronic back pain    COPD (chronic obstructive pulmonary disease) (HCC)    Chronic bronchitis   Depression    GERD (gastroesophageal reflux disease)    Hypertension    Neuropathy    Osteoarthritis of left knee, 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   Sleep apnea 04/2020   GETTING A cpap   Suicidal ideation 12/24/2018    Past Surgical History:  Procedure Laterality Date   BACK SURGERY     DILATATION AND CURETTAGE/HYSTEROSCOPY WITH MINERVA N/A 06/09/2020   Procedure: DILATATION AND CURETTAGE/HYSTEROSCOPY WITH MINERVA;  Surgeon: Florian Buff, MD;   Location: [facility];  Service: Gynecology;  Laterality: N/A;   ESOPHAGOGASTRODUODENOSCOPY (EGD) WITH PROPOFOL N/A 12/25/2018   Procedure: ESOPHAGOGASTRODUODENOSCOPY (EGD) WITH PROPOFOL;  Surgeon: Rogene Houston, MD;  Location: [facility] SUITE;  Service: Endoscopy;  Laterality: N/A;   PATELLA-FEMORAL ARTHROPLASTY Left 10/08/2018   Procedure: PATELLA-FEMORAL ARTHROPLASTY;  Surgeon: Marchia Bond, MD;  Location: [facility];  Service: Orthopedics;  Laterality: Left;   TUBAL LIGATION      Family History  Problem Relation Age of Onset   Gout Paternal Grandfather    Cirrhosis Paternal Grandfather    Hypertension Paternal Grandmother    Aneurysm Paternal Grandmother    Cirrhosis Maternal Grandmother    Cirrhosis Maternal Grandfather    Cancer Father    Cirrhosis Father    Cirrhosis Mother    Breast cancer Sister    Hypertension Sister    Bronchitis Daughter    Bronchitis Daughter    Asthma Son    Bronchitis Son    Migraines Neg Hx     Social History   Socioeconomic History   Marital status: Single    Spouse name: Not on file   Number of children: Not on file   Years of education: Not on file   Highest education level: Not on file  Occupational History   Not on file  Tobacco Use   Smoking  status: Former    Types: Cigarettes    Quit date: 2016    Years since quitting: 6.9   Smokeless tobacco: Never  Vaping Use   Vaping Use: Never used  Substance and Sexual Activity   Alcohol use: Not Currently   Drug use: Not Currently    Types: Cocaine    Comment: crack  last used 2016   Sexual activity: Yes    Birth control/protection: Surgical    Comment: tubal, ablation  Other Topics Concern   Not on file  Social History Narrative   R handed    Lives with boyfriend   1 Cup of caffeine daily    Social Determinants of Health   Financial Resource Strain: Not on file  Food Insecurity: No Food Insecurity   Worried About Charity fundraiser in the Last Year: Never true   Ran Out of  Food in the Last Year: Never true  Transportation Needs: No Transportation Needs   Lack of Transportation (Medical): No   Lack of Transportation (Non-Medical): No  Physical Activity: Not on file  Stress: Not on file  Social Connections: Not on file  Intimate Partner Violence: Not on file    Outpatient Medications Prior to Visit  Medication Sig Dispense Refill   albuterol (PROVENTIL) (2.5 MG/3ML) 0.083% nebulizer solution Take 2.5 mg by nebulization every 6 (six) hours as needed for wheezing or shortness of breath.     apixaban (ELIQUIS) 5 MG TABS tablet Take 1 tablet (5 mg total) by mouth 2 (two) times daily. 60 tablet 0   Cyanocobalamin (VITAMIN B 12 PO) Take 1 tablet by mouth daily.     furosemide (LASIX) 20 MG tablet Take 1 tablet (20 mg total) by mouth daily. 30 tablet 0   potassium chloride SA (KLOR-CON) 20 MEQ tablet Take 1 tablet (20 mEq total) by mouth daily. 30 tablet 0   pregabalin (LYRICA) 75 MG capsule Take 1 capsule (75 mg total) by mouth 2 (two) times daily. 60 capsule 5   cloNIDine (CATAPRES) 0.1 MG tablet TAKE 1 TABLET BY MOUTH ONCE DAILY FOR BLOOD PRESSURE AND ANXIETY. (Patient taking differently: Take 0.1 mg by mouth daily.) 90 tablet 0   hydrOXYzine (VISTARIL) 50 MG capsule Take 1 capsule (50 mg total) by mouth daily as needed for anxiety (Insomnia). 30 capsule 5   Cholecalciferol (VITAMIN D) 50 MCG (2000 UT) CAPS Take 1 capsule by mouth daily. (Patient not taking: Reported on 03/24/2021)     hydrocortisone (ANUSOL-HC) 2.5 % rectal cream Place 1 application rectally 2 (two) times daily. (Patient not taking: Reported on 03/24/2021) 30 g 0   methocarbamol (ROBAXIN) 500 MG tablet Take 1 tablet (500 mg total) by mouth 2 (two) times daily. (Patient not taking: Reported on 03/24/2021) 20 tablet 0   oxyCODONE (ROXICODONE) 5 MG immediate release tablet Take 1 tablet (5 mg total) by mouth every 6 (six) hours as needed for severe pain. (Patient not taking: Reported on 03/24/2021) 8 tablet  0   amLODipine (NORVASC) 5 MG tablet TAKE 1 TABLET BY MOUTH ONCE A DAY FOR BLOOD PRESSURE. (Patient not taking: Reported on 03/24/2021) 90 tablet 0   budesonide-formoterol (SYMBICORT) 80-4.5 MCG/ACT inhaler Inhale 2 puffs into the lungs 2 (two) times daily. (Patient not taking: Reported on 03/24/2021) 1 each 5   Camphor-Menthol-Methyl Sal (SALONPAS) 3.04-22-08 % PTCH Apply 1 patch topically as needed (pain). (Patient not taking: Reported on 03/24/2021)     citalopram (CELEXA) 20 MG tablet TAKE ONE TABLET BY  MOUTH ONCE DAILY. (Patient not taking: Reported on 03/24/2021) 30 tablet 0   diazepam (VALIUM) 5 MG tablet Take 1 tablet (5 mg total) by mouth 2 (two) times daily. (Patient not taking: Reported on 03/24/2021) 10 tablet 0   DULoxetine (CYMBALTA) 30 MG capsule Take 30 mg by mouth 2 (two) times daily. (Patient not taking: Reported on 03/24/2021)     pregabalin (LYRICA) 75 MG capsule Take 1 capsule (75 mg total) by mouth 2 (two) times daily. (Patient not taking: Reported on 03/24/2021) 30 capsule 0   No facility-administered medications prior to visit.    Allergies  Allergen Reactions   Clonopin [Clonazepam] Anaphylaxis    Swelling of the tongue and mouth.    Fish Allergy Anaphylaxis, Shortness Of Breath and Swelling   Flexeril [Cyclobenzaprine Hcl] Shortness Of Breath   Ibuprofen Anaphylaxis, Hives and Other (See Comments)   Shellfish Allergy Anaphylaxis   Tylenol [Acetaminophen] Anaphylaxis   Ace Inhibitors Cough   Cyclobenzaprine    Tramadol Nausea And Vomiting    Upset stomach   Trazodone And Nefazodone Hives    ROS Review of Systems  Constitutional:  Negative for chills and fever.  HENT:  Negative for congestion, sinus pressure, sinus pain and sore throat.   Eyes:  Negative for pain and discharge.  Respiratory:  Negative for cough and shortness of breath.   Cardiovascular:  Negative for chest pain and palpitations.  Gastrointestinal:  Negative for abdominal pain, constipation, diarrhea,  nausea and vomiting.  Endocrine: Negative for polydipsia and polyuria.  Genitourinary:  Positive for pelvic pain. Negative for dysuria, hematuria, menstrual problem, vaginal bleeding and vaginal discharge.  Musculoskeletal:  Positive for arthralgias and back pain. Negative for neck pain and neck stiffness.  Skin:  Negative for rash.  Neurological:  Negative for dizziness and weakness.  Psychiatric/Behavioral:  Positive for dysphoric mood and sleep disturbance. Negative for agitation and behavioral problems.      Objective:    Physical Exam Vitals reviewed.  Constitutional:      General: She is not in acute distress.    Appearance: She is obese. She is not diaphoretic.  HENT:     Head: Normocephalic and atraumatic.     Nose: Nose normal.     Mouth/Throat:     Mouth: Mucous membranes are moist.  Eyes:     General: No scleral icterus.    Extraocular Movements: Extraocular movements intact.  Cardiovascular:     Rate and Rhythm: Normal rate and regular rhythm.     Pulses: Normal pulses.     Heart sounds: Normal heart sounds. No murmur heard. Pulmonary:     Breath sounds: Normal breath sounds. No wheezing or rales.  Abdominal:     Palpations: Abdomen is soft.     Tenderness: There is no abdominal tenderness. There is no guarding or rebound.  Musculoskeletal:        General: Tenderness (Lumbar spine area) present.     Cervical back: Neck supple. No tenderness.     Right lower leg: No edema.     Left lower leg: No edema.  Skin:    General: Skin is warm.     Findings: No rash.  Neurological:     General: No focal deficit present.     Mental Status: She is alert and oriented to person, place, and time.     Sensory: No sensory deficit.     Motor: Weakness (B/l LE (chronic)) present.  Psychiatric:        Mood  and Affect: Mood normal.        Behavior: Behavior normal.    BP (!) 158/86 (BP Location: Left Arm, Patient Position: Sitting, Cuff Size: Normal)   Pulse 99   Resp 18    Ht '4\' 11"'  (1.499 m)   Wt 202 lb 1.3 oz (91.7 kg)   LMP 06/07/2020   SpO2 99%   BMI 40.82 kg/m  Wt Readings from Last 3 Encounters:  03/24/21 202 lb 1.3 oz (91.7 kg)  03/22/21 210 lb (95.3 kg)  03/17/21 204 lb (92.5 kg)    Lab Results  Component Value Date   TSH 1.533 12/24/2018   Lab Results  Component Value Date   WBC 11.3 (H) 03/04/2021   HGB 12.7 03/04/2021   HCT 39.7 03/04/2021   MCV 87.6 03/04/2021   PLT 724 (H) 03/04/2021   Lab Results  Component Value Date   NA 138 03/04/2021   K 3.6 03/04/2021   CO2 19 (L) 03/04/2021   GLUCOSE 90 03/04/2021   BUN 19 03/04/2021   CREATININE 0.77 03/04/2021   BILITOT 0.5 03/04/2021   ALKPHOS 80 03/04/2021   AST 16 03/04/2021   ALT 18 03/04/2021   PROT 8.0 03/04/2021   ALBUMIN 4.0 03/04/2021   CALCIUM 9.2 03/04/2021   ANIONGAP 14 03/04/2021   Lab Results  Component Value Date   CHOL 155 04/20/2017   Lab Results  Component Value Date   HDL 70 04/20/2017   Lab Results  Component Value Date   LDLCALC 72 04/20/2017   Lab Results  Component Value Date   TRIG 46 04/20/2017   Lab Results  Component Value Date   CHOLHDL 2.2 04/20/2017   Lab Results  Component Value Date   HGBA1C 5.4 04/20/2017      Assessment & Plan:   Problem List Items Addressed This Visit       Cardiovascular and Mediastinum   Hypertension   Relevant Medications   amLODipine (NORVASC) 5 MG tablet   cloNIDine (CATAPRES) 0.1 MG tablet     Respiratory   Mild persistent asthma without complication   Relevant Medications   mometasone-formoterol (DULERA) 100-5 MCG/ACT AERO     Other   MDD (major depressive disorder), recurrent episode, severe (HCC)   Relevant Medications   DULoxetine (CYMBALTA) 30 MG capsule   hydrOXYzine (VISTARIL) 50 MG capsule   diazepam (VALIUM) 5 MG tablet   Insomnia   S/P lumbar fusion   Relevant Medications   Misc. Devices MISC   Other Relevant Orders   VITAMIN D 25 Hydroxy (Vit-D Deficiency, Fractures)    Other Visit Diagnoses     Encounter for general adult medical examination with abnormal findings    -  Primary   Relevant Orders   TSH   Lipid panel   Hemoglobin A1c   CMP14+EGFR   CBC with Differential/Platelet   GAD (generalized anxiety disorder)       Relevant Medications   DULoxetine (CYMBALTA) 30 MG capsule   hydrOXYzine (VISTARIL) 50 MG capsule   diazepam (VALIUM) 5 MG tablet   Other Relevant Orders   Ambulatory referral to Psychiatry   Vitamin D deficiency       Relevant Orders   VITAMIN D 25 Hydroxy (Vit-D Deficiency, Fractures)       Meds ordered this encounter  Medications   amLODipine (NORVASC) 5 MG tablet    Sig: Take 1 tablet (5 mg total) by mouth daily. for blood pressure    Dispense:  90 tablet  Refill:  0   mometasone-formoterol (DULERA) 100-5 MCG/ACT AERO    Sig: Inhale 2 puffs into the lungs 2 (two) times daily.    Dispense:  13 g    Refill:  5   cloNIDine (CATAPRES) 0.1 MG tablet    Sig: Take 1 tablet (0.1 mg total) by mouth daily.    Dispense:  90 tablet    Refill:  0   DULoxetine (CYMBALTA) 30 MG capsule    Sig: Take 1 capsule (30 mg total) by mouth 2 (two) times daily.    Dispense:  60 capsule    Refill:  5   hydrOXYzine (VISTARIL) 50 MG capsule    Sig: Take 1 capsule (50 mg total) by mouth daily as needed for anxiety (Insomnia).    Dispense:  30 capsule    Refill:  5   diazepam (VALIUM) 5 MG tablet    Sig: Take 1 tablet (5 mg total) by mouth at bedtime as needed for anxiety.    Dispense:  30 tablet    Refill:  0   Misc. Devices MISC    Sig: Rolling walker - ICD10: M48.061, Z98.1, M17.9    Dispense:  1 each    Refill:  0    Follow-up: Return in about 3 months (around 06/22/2021) for HTN and depression with anxiety.    Lindell Spar, MD

## 2021-03-24 NOTE — Assessment & Plan Note (Signed)
Has been using Dulera, refilled

## 2021-03-24 NOTE — Assessment & Plan Note (Signed)
Refilled Vistaril and Valium for now Refer to psychiatry

## 2021-03-24 NOTE — Assessment & Plan Note (Signed)
BP Readings from Last 1 Encounters:  03/24/21 (!) 158/86   Elevated today, likely due to pain and has not taken her medication this morning Well-controlled overall Counseled for compliance with the medications Advised DASH diet and moderate exercise/walking, at least 150 mins/week

## 2021-03-24 NOTE — Assessment & Plan Note (Signed)

## 2021-03-25 ENCOUNTER — Encounter (HOSPITAL_COMMUNITY): Payer: 59

## 2021-03-27 ENCOUNTER — Encounter: Payer: 59 | Admitting: Neurology

## 2021-03-30 ENCOUNTER — Encounter: Payer: Self-pay | Admitting: Internal Medicine

## 2021-03-31 ENCOUNTER — Other Ambulatory Visit: Payer: Self-pay | Admitting: *Deleted

## 2021-03-31 ENCOUNTER — Telehealth: Payer: Self-pay | Admitting: Internal Medicine

## 2021-03-31 DIAGNOSIS — R600 Localized edema: Secondary | ICD-10-CM

## 2021-03-31 MED ORDER — POTASSIUM CHLORIDE CRYS ER 20 MEQ PO TBCR: 20.0000 meq | EXTENDED_RELEASE_TABLET | Freq: Every day | ORAL | 0 refills | Status: DC

## 2021-03-31 MED ORDER — FUROSEMIDE 20 MG PO TABS: 20.0000 mg | ORAL_TABLET | Freq: Every day | ORAL | 0 refills | Status: DC

## 2021-03-31 NOTE — Telephone Encounter (Signed)
Pt medication sent to pharmacy  

## 2021-03-31 NOTE — Telephone Encounter (Signed)
Pt need refills on   furosemide (LASIX) 20 MG tablet [[id number]]  potassium chloride SA (KLOR-CON) 20 MEQ tablet     [employer]

## 2021-03-31 NOTE — Telephone Encounter (Signed)
Pt also needs   DULoxetine (CYMBALTA) 30 MG capsule    View last message

## 2021-04-01 ENCOUNTER — Telehealth: Payer: Self-pay | Admitting: Internal Medicine

## 2021-04-01 NOTE — Telephone Encounter (Signed)
Pt called in regard to   DULoxetine (CYMBALTA) 30 MG capsule  Pt needs med sent in to [facility]

## 2021-04-05 ENCOUNTER — Other Ambulatory Visit: Payer: Self-pay

## 2021-04-05 ENCOUNTER — Emergency Department (HOSPITAL_COMMUNITY)
Admission: EM | Admit: 2021-04-05 | Discharge: 2021-04-05 | Disposition: A | Discharge status: Home / Self Care | Payer: 59 | Attending: Emergency Medicine | Admitting: Emergency Medicine

## 2021-04-05 ENCOUNTER — Encounter (HOSPITAL_COMMUNITY): Payer: Self-pay

## 2021-04-05 ENCOUNTER — Other Ambulatory Visit: Payer: Self-pay | Admitting: Internal Medicine

## 2021-04-05 ENCOUNTER — Emergency Department (HOSPITAL_COMMUNITY): Payer: 59

## 2021-04-05 DIAGNOSIS — J4530 Mild persistent asthma, uncomplicated: Secondary | ICD-10-CM

## 2021-04-05 DIAGNOSIS — I10 Essential (primary) hypertension: Secondary | ICD-10-CM | POA: Insufficient documentation

## 2021-04-05 DIAGNOSIS — R1013 Epigastric pain: Secondary | ICD-10-CM | POA: Insufficient documentation

## 2021-04-05 DIAGNOSIS — J45909 Unspecified asthma, uncomplicated: Secondary | ICD-10-CM | POA: Insufficient documentation

## 2021-04-05 DIAGNOSIS — R1012 Left upper quadrant pain: Secondary | ICD-10-CM | POA: Insufficient documentation

## 2021-04-05 DIAGNOSIS — Z87891 Personal history of nicotine dependence: Secondary | ICD-10-CM | POA: Diagnosis not present

## 2021-04-05 DIAGNOSIS — Z79899 Other long term (current) drug therapy: Secondary | ICD-10-CM | POA: Diagnosis not present

## 2021-04-05 DIAGNOSIS — J4540 Moderate persistent asthma, uncomplicated: Secondary | ICD-10-CM | POA: Insufficient documentation

## 2021-04-05 DIAGNOSIS — Z7901 Long term (current) use of anticoagulants: Secondary | ICD-10-CM | POA: Insufficient documentation

## 2021-04-05 DIAGNOSIS — J449 Chronic obstructive pulmonary disease, unspecified: Secondary | ICD-10-CM | POA: Insufficient documentation

## 2021-04-05 LAB — CBC
HCT: 37.7 % (ref 36.0–46.0)
Hemoglobin: 11.9 g/dL — ABNORMAL LOW (ref 12.0–15.0)
MCH: 27.9 pg (ref 26.0–34.0)
MCHC: 31.6 g/dL (ref 30.0–36.0)
MCV: 88.5 fL (ref 80.0–100.0)
Platelets: 596 10*3/uL — ABNORMAL HIGH (ref 150–400)
RBC: 4.26 MIL/uL (ref 3.87–5.11)
RDW: 16.6 % — ABNORMAL HIGH (ref 11.5–15.5)
WBC: 8.6 10*3/uL (ref 4.0–10.5)
nRBC: 0 % (ref 0.0–0.2)

## 2021-04-05 LAB — COMPREHENSIVE METABOLIC PANEL
ALT: 25 U/L (ref 0–44)
AST: 17 U/L (ref 15–41)
Albumin: 3.6 g/dL (ref 3.5–5.0)
Alkaline Phosphatase: 75 U/L (ref 38–126)
Anion gap: 8 (ref 5–15)
BUN: 15 mg/dL (ref 6–20)
CO2: 26 mmol/L (ref 22–32)
Calcium: 9.4 mg/dL (ref 8.9–10.3)
Chloride: 104 mmol/L (ref 98–111)
Creatinine, Ser: 0.59 mg/dL (ref 0.44–1.00)
GFR, Estimated: >60 mL/min (ref >60)
Glucose, Bld: 95 mg/dL (ref 70–99)
Potassium: 4.5 mmol/L (ref 3.5–5.1)
Sodium: 138 mmol/L (ref 135–145)
Total Bilirubin: 0.3 mg/dL (ref 0.3–1.2)
Total Protein: 7.3 g/dL (ref 6.5–8.1)

## 2021-04-05 LAB — LIPASE, BLOOD: Lipase: 52 U/L — ABNORMAL HIGH (ref 11–51)

## 2021-04-05 MED ORDER — MORPHINE SULFATE (PF) 4 MG/ML IV SOLN
6.0000 mg | Freq: Once | INTRAVENOUS | Status: AC
  Administered 2021-04-05: 09:00:00 6 mg via INTRAVENOUS
  Filled 2021-04-05: qty 2

## 2021-04-05 MED ORDER — HYDROMORPHONE HCL 2 MG PO TABS
2.0000 mg | ORAL_TABLET | Freq: Once | ORAL | Status: AC
  Administered 2021-04-05: 10:00:00 2 mg via ORAL
  Filled 2021-04-05: qty 1

## 2021-04-05 MED ORDER — IOHEXOL 300 MG/ML  SOLN
100.0000 mL | Freq: Once | INTRAMUSCULAR | Status: AC | PRN
  Administered 2021-04-05: 09:00:00 100 mL via INTRAVENOUS

## 2021-04-05 MED ORDER — ALUM & MAG HYDROXIDE-SIMETH 200-200-20 MG/5ML PO SUSP
30.0000 mL | Freq: Once | ORAL | Status: AC
  Administered 2021-04-05: 10:00:00 30 mL via ORAL
  Filled 2021-04-05: qty 30

## 2021-04-05 MED ORDER — FLUTICASONE-SALMETEROL 100-50 MCG/ACT IN AEPB: 1.0000 | INHALATION_SPRAY | Freq: Two times a day (BID) | RESPIRATORY_TRACT | 3 refills | Status: DC

## 2021-04-05 MED ORDER — SUCRALFATE 1 G PO TABS: 1.0000 g | ORAL_TABLET | Freq: Three times a day (TID) | ORAL | 0 refills | Status: DC

## 2021-04-05 MED ORDER — SODIUM CHLORIDE 0.9 % IV BOLUS
500.0000 mL | Freq: Once | INTRAVENOUS | Status: AC
  Administered 2021-04-05: 09:00:00 500 mL via INTRAVENOUS

## 2021-04-05 NOTE — ED Notes (Signed)
Pt returned from CT °

## 2021-04-05 NOTE — ED Notes (Signed)
Patient transported to CT 

## 2021-04-05 NOTE — Progress Notes (Signed)
Switched from [place] to [employer] due to insurance preference.

## 2021-04-05 NOTE — ED Triage Notes (Signed)
Patient with complaints of upper abdominal pain that yesterday. Denies N/V/D and tender to palpation across epigastric and both upper abdominal quadrants. Also, has complaints right rib pain from fall onto table, right foot pain with history of neuropathy states she took a lyrica this morning without relief.

## 2021-04-05 NOTE — ED Provider Notes (Signed)
[facility] EMERGENCY DEPARTMENT Provider Note   CSN: [id number] Arrival date & time: 04/05/21  8921     History Chief Complaint  Patient presents with   Abdominal Pain    Kathryn [name] is a 50 y.o. female.  HPI    50 year old female comes in with chief complaint of abdominal pain.  She reports that she has been having epigastric and left upper quadrant abdominal pain since last night.  The pain is sharp, constant with no specific evoking, aggravating or relieving factor.  She has no history of this type of pain.  She does not think it is associated with food intake.  Pain is nonradiating.  Described as sharp, with no associated diarrhea, fevers, chills.  Patient has nausea without emesis.  Patient does indicate that the pain is worse with deep inspiration.  She has been diagnosed with a small PE and is taking Eliquis for it.  Past Medical History:  Diagnosis Date   Anxiety    Arthritis    Asthma    Bipolar disorder ([place])    Chronic back pain    COPD (chronic obstructive pulmonary disease) (HCC)    Chronic bronchitis   Depression    GERD (gastroesophageal reflux disease)    Hypertension    Neuropathy    Osteoarthritis of left knee, 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   Sleep apnea 04/2020   GETTING A cpap   Suicidal ideation 12/24/2018    Patient Active Problem List   Diagnosis Date Noted   Encounter for general adult medical examination with abnormal findings 03/24/2021   GAD (generalized anxiety disorder) 03/24/2021   Chest pain 02/21/2021   Thyroid nodule 02/21/2021   History of pulmonary embolism    Chronic pain    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   Encounter for examination following treatment at hospital 01/17/2021   Preoperative examination 01/17/2021   Bilateral leg edema 10/18/2020   Morbid obesity ([place]) 10/18/2020   S/P lumbar fusion 09/14/2020   History of DVT of lower extremity 09/14/2020   Spondylolisthesis at L4-L5  level 03/29/2020   Insomnia 03/19/2020   Moderate persistent asthma without complication 19/41/7408   Need for immunization against influenza 03/19/2020   Body mass index (BMI) 38.0-38.9, adult 03/19/2020   GERD (gastroesophageal reflux disease) 03/19/2020   Intermittent palpitations 12/17/2019   Snoring 12/17/2019   Excessive daytime sleepiness 12/17/2019   Nightmare disorder, 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   Enlarged uterus 02/20/2018   Screening for colorectal cancer 02/20/2018   OA (osteoarthritis) of knee 12/27/2017   MDD (major depressive disorder), recurrent episode, severe ([place]) 12/24/2017   Lumbar spinal stenosis 05/11/2017   Low vitamin B12 level 05/11/2017   Hypertension 01/11/2017    Past Surgical History:  Procedure Laterality Date   BACK SURGERY     DILATATION AND CURETTAGE/HYSTEROSCOPY WITH MINERVA N/A 06/09/2020   Procedure: DILATATION AND CURETTAGE/HYSTEROSCOPY WITH MINERVA;  Surgeon: Florian Buff, MD;  Location: [facility];  Service: Gynecology;  Laterality: N/A;   ESOPHAGOGASTRODUODENOSCOPY (EGD) WITH PROPOFOL N/A 12/25/2018   Procedure: ESOPHAGOGASTRODUODENOSCOPY (EGD) WITH PROPOFOL;  Surgeon: Rogene Houston, MD;  Location: [facility];  Service: Endoscopy;  Laterality: N/A;   PATELLA-FEMORAL ARTHROPLASTY Left 10/08/2018   Procedure: PATELLA-FEMORAL ARTHROPLASTY;  Surgeon: Marchia Bond, MD;  Location: [facility];  Service: Orthopedics;  Laterality: Left;   TUBAL LIGATION       OB History     Gravida  5   Para  3   Term  2  Preterm  1   AB  2   Living  3      SAB  1   IAB  1   Ectopic      Multiple      Live Births              Family History  Problem Relation Age of Onset   Gout Paternal Grandfather    Cirrhosis Paternal Grandfather    Hypertension Paternal Grandmother    Aneurysm Paternal Grandmother    Cirrhosis Maternal Grandmother    Cirrhosis Maternal Grandfather    Cancer Father    Cirrhosis Father    Cirrhosis Mother     Breast cancer Sister    Hypertension Sister    Bronchitis Daughter    Bronchitis Daughter    Asthma Son    Bronchitis Son    Migraines Neg Hx     Social History   Tobacco Use   Smoking status: Former    Types: Cigarettes    Quit date: 2016    Years since quitting: 6.9   Smokeless tobacco: Never  Vaping Use   Vaping Use: Never used  Substance Use Topics   Alcohol use: Not Currently   Drug use: Not Currently    Types: Cocaine    Comment: crack  last used 2016    Home Medications Prior to Admission medications   Medication Sig Start Date End Date Taking? Authorizing Provider  albuterol (PROVENTIL) (2.5 MG/3ML) 0.083% nebulizer solution Take 2.5 mg by nebulization every 6 (six) hours as needed for wheezing or shortness of breath.   Yes [provider]  amLODipine (NORVASC) 5 MG tablet Take 1 tablet (5 mg total) by mouth daily. for blood pressure 03/24/21  Yes Patel, Colin Broach, MD  apixaban (ELIQUIS) 5 MG TABS tablet Take 1 tablet (5 mg total) by mouth 2 (two) times daily. 02/22/21  Yes Arrien, Jimmy Picket, MD  Cholecalciferol (VITAMIN D) 50 MCG (2000 UT) CAPS Take 1 capsule by mouth daily.   Yes [provider]  cloNIDine (CATAPRES) 0.1 MG tablet Take 1 tablet (0.1 mg total) by mouth daily. 03/24/21  Yes Lindell Spar, MD  Cyanocobalamin (VITAMIN B 12 PO) Take 1 tablet by mouth daily.   Yes [provider]  diazepam (VALIUM) 5 MG tablet Take 1 tablet (5 mg total) by mouth at bedtime as needed for anxiety. 03/24/21  Yes Lindell Spar, MD  DULoxetine (CYMBALTA) 30 MG capsule Take 1 capsule (30 mg total) by mouth 2 (two) times daily. 03/24/21  Yes Lindell Spar, MD  furosemide (LASIX) 20 MG tablet Take 1 tablet (20 mg total) by mouth daily. 03/31/21 04/30/21 Yes Lindell Spar, MD  hydrOXYzine (VISTARIL) 50 MG capsule Take 1 capsule (50 mg total) by mouth daily as needed for anxiety (Insomnia). 03/24/21  Yes Lindell Spar, MD  Misc. Devices MISC  Rolling walker - ICD10: M48.061, Z98.1, M17.9 03/24/21  Yes Patel, Colin Broach, MD  mometasone-formoterol (DULERA) 100-5 MCG/ACT AERO Inhale 2 puffs into the lungs 2 (two) times daily. 03/24/21  Yes Lindell Spar, MD  omeprazole (PRILOSEC) 20 MG capsule Take 1 capsule (20 mg total) by mouth daily. 03/24/21  Yes Lindell Spar, MD  potassium chloride SA (KLOR-CON M) 20 MEQ tablet Take 1 tablet (20 mEq total) by mouth daily. 03/31/21  Yes Lindell Spar, MD  pregabalin (LYRICA) 75 MG capsule Take 1 capsule (75 mg total) by mouth 2 (two) times daily. 01/17/21  Yes Lindell Spar, MD  sucralfate (CARAFATE) 1 g tablet Take 1 tablet (1 g total) by mouth 4 (four) times daily -  with meals and at bedtime. 04/05/21  Yes Tasean Mancha, Thelma Comp, MD  hydrocortisone (ANUSOL-HC) 2.5 % rectal cream Place 1 application rectally 2 (two) times daily. Patient not taking: Reported on 03/24/2021 11/18/20   Lindell Spar, MD  methocarbamol (ROBAXIN) 500 MG tablet Take 1 tablet (500 mg total) by mouth 2 (two) times daily. Patient not taking: Reported on 03/24/2021 03/22/21   Mickie Hillier, PA-C  oxyCODONE (ROXICODONE) 5 MG immediate release tablet Take 1 tablet (5 mg total) by mouth every 6 (six) hours as needed for severe pain. Patient not taking: Reported on 03/24/2021 03/22/21   Mickie Hillier, PA-C    Allergies    Clonopin [clonazepam], Fish allergy, Flexeril [cyclobenzaprine hcl], Ibuprofen, Shellfish allergy, Tylenol [acetaminophen], Ace inhibitors, Cyclobenzaprine, Tramadol, and Trazodone and nefazodone  Review of Systems   Review of Systems  Constitutional:  Positive for activity change.  Gastrointestinal:  Positive for abdominal pain.  All other systems reviewed and are negative.  Physical Exam Updated Vital Signs BP 120/74    Pulse 83    Temp 98 F (36.7 C) (Oral)    Resp 14    Ht 4\' 11"  (1.499 m)    Wt 95.3 kg    LMP 06/07/2020    SpO2 99%    BMI 42.41 kg/m   Physical Exam Vitals and nursing note reviewed.   Constitutional:      Appearance: She is well-developed.  HENT:     Head: Atraumatic.  Cardiovascular:     Rate and Rhythm: Normal rate.  Pulmonary:     Effort: Pulmonary effort is normal.  Abdominal:     Tenderness: There is abdominal tenderness in the epigastric area and left upper quadrant. There is guarding. There is no right CVA tenderness, left CVA tenderness or rebound.  Musculoskeletal:     Cervical back: Normal range of motion and neck supple.  Skin:    General: Skin is warm and dry.  Neurological:     Mental Status: She is alert and oriented to person, place, and time.    ED Results / Procedures / Treatments   Labs (all labs ordered are listed, but only abnormal results are displayed) Labs Reviewed  LIPASE, BLOOD - Abnormal; Notable for the following components:      Result Value   Lipase 52 (*)    All other components within normal limits  CBC - Abnormal; Notable for the following components:   Hemoglobin 11.9 (*)    RDW 16.6 (*)    Platelets 596 (*)    All other components within normal limits  COMPREHENSIVE METABOLIC PANEL  URINALYSIS, ROUTINE W REFLEX MICROSCOPIC  POC URINE PREG, ED    EKG None  Radiology CT ABDOMEN PELVIS W CONTRAST  Result Date: 04/05/2021 CLINICAL DATA:  Abdominal pain, left lower quadrant EXAM: CT ABDOMEN AND PELVIS WITH CONTRAST TECHNIQUE: Multidetector CT imaging of the abdomen and pelvis was performed using the standard protocol following bolus administration of intravenous contrast. CONTRAST:  183mL OMNIPAQUE IOHEXOL 300 MG/ML  SOLN COMPARISON:  CT abdomen and pelvis dated November 06, 2020 FINDINGS: Lower chest: No acute abnormality. Hepatobiliary: Small hyperenhancing lesion of the dome of the liver measuring 8 mm on series 2, image 12, findings unchanged when compared with prior exam, likely due to a flash fill hemangioma. Unchanged subcapsular low-attenuation lesion of the anterior right lobe  of the liver. No suspicious liver lesions  Pancreas: Unremarkable. No pancreatic ductal dilatation or surrounding inflammatory changes. Spleen: Normal in size without focal abnormality. Adrenals/Urinary Tract: Adrenal glands are unremarkable. Kidneys are normal, without renal calculi, focal lesion, or hydronephrosis. Bladder is unremarkable. Stomach/Bowel: Small hiatal hernia. Vascular/Lymphatic: Aortic atherosclerosis. No enlarged abdominal or pelvic lymph nodes. Reproductive: Fibroid uterus.  Bilateral adnexa are unremarkable. Other: No abdominal wall hernia or abnormality. No abdominopelvic ascites. Musculoskeletal: Posterior fusion hardware of the lower thoracic spine. No acute or significant osseous findings. IMPRESSION: 1. No acute findings in the abdomen or pelvis. 2.  Aortic Atherosclerosis (ICD10-I70.0). Electronically Signed   By: Yetta Glassman M.D.   On: 04/05/2021 09:41    Procedures Procedures   Medications Ordered in ED Medications  alum & mag hydroxide-simeth (MAALOX/MYLANTA) 200-200-20 MG/5ML suspension 30 mL (has no administration in time range)  HYDROmorphone (DILAUDID) tablet 2 mg (has no administration in time range)  morphine 4 MG/ML injection 6 mg (6 mg Intravenous Given 04/05/21 0838)  sodium chloride 0.9 % bolus 500 mL (500 mLs Intravenous New Bag/Given 04/05/21 0838)  iohexol (OMNIPAQUE) 300 MG/ML solution 100 mL (100 mLs Intravenous Contrast Given 04/05/21 1610)    ED Course  I have reviewed the triage vital signs and the nursing notes.  Pertinent labs & imaging results that were available during my care of the patient were reviewed by me and considered in my medical decision making (see chart for details).    MDM Rules/Calculators/A&P                         50 year old female comes in with chief complaint of abdominal pain.  She reports sudden onset abdominal pain starting last night.  Pain is constant, severe and on exam there is guarding without any rebound or rigid abdomen.  Differential diagnosis  includes splenic infarct, renal stone, gastritis, pancreatitis, cholelithiasis, given that she is on blood thinners, internal bleeding around the spleen or kidney.   Patient has no UTI-like symptoms, with sudden onset of symptoms, doubt perinephric abscess.  Given the severity of the pain, CT scan will be ordered.  Although patient reports that her abdominal pain is worse with inspiration, I doubt this is related to PE.  I have reviewed patient's prior CT scan and upper endoscopy.  The endoscopy did not reveal any concerning findings.  The CT scan had shown a nonocclusive PE.  Being aware of those findings, limits our work-up to not getting a necessary CT scan of the chest.  11:06 AM CT scan results are reassuring.  The patient appears reasonably screened and/or stabilized for discharge and I doubt any other medical condition or other [facility] requiring further screening, evaluation, or treatment in the ED at this time prior to discharge.   Results from the ER workup discussed with the patient face to face and all questions answered to the best of my ability. The patient is safe for discharge with strict return precautions.      Final Clinical Impression(s) / ED Diagnoses Final diagnoses:  Left upper quadrant abdominal pain  Epigastric pain    Rx / DC Orders ED Discharge Orders          Ordered    sucralfate (CARAFATE) 1 g tablet  3 times daily with meals & bedtime        04/05/21 1019             Varney Biles, MD 04/05/21 1106

## 2021-04-05 NOTE — Discharge Instructions (Signed)
We saw you in the ER for the abdominal pain. All of our results are normal, including all labs and imaging. Kidney function is fine as well. We are not sure what is causing your abdominal pain, and recommend that you see your primary care doctor within 3-5 days for further evaluation. If your symptoms get worse, return to the ER. Take the pain meds and nausea meds as prescribed.

## 2021-04-06 ENCOUNTER — Telehealth: Payer: Self-pay | Admitting: Pulmonary Disease

## 2021-04-06 NOTE — Telephone Encounter (Signed)
Fax received from Dr. Charlies Constable to perform a R total knee replacment on patient.  Patient needs surgery clearance. Patient will be seen on 04/12/21 for a consult. Office protocol is a risk assessment can be sent to surgeon if patient has been seen in 60 days or less.   Sending to Dr. Silas Flood for risk assessment after OV.

## 2021-04-07 ENCOUNTER — Ambulatory Visit: Payer: 59 | Admitting: Internal Medicine

## 2021-04-07 ENCOUNTER — Telehealth: Payer: Self-pay | Admitting: Internal Medicine

## 2021-04-07 NOTE — Telephone Encounter (Signed)
FL2 FORMS  NOTED COPIED SLEEVED

## 2021-04-08 ENCOUNTER — Telehealth: Payer: Self-pay | Admitting: Internal Medicine

## 2021-04-08 NOTE — Telephone Encounter (Signed)
Pt is calling she needs something for anxiety

## 2021-04-12 ENCOUNTER — Institutional Professional Consult (permissible substitution): Payer: 59 | Admitting: Pulmonary Disease

## 2021-04-12 NOTE — Telephone Encounter (Signed)
Pt will need an appt with provider to discuss can be virtual

## 2021-04-12 NOTE — Telephone Encounter (Signed)
Patient no show for pre-op appointment.

## 2021-04-12 NOTE — Telephone Encounter (Signed)
LVM that appt needs to be made before meds prescribed

## 2021-04-22 ENCOUNTER — Ambulatory Visit: Payer: 59 | Admitting: Adult Health

## 2021-04-25 ENCOUNTER — Telehealth: Payer: 59 | Admitting: Internal Medicine

## 2021-04-25 ENCOUNTER — Other Ambulatory Visit: Payer: Self-pay

## 2021-04-26 ENCOUNTER — Other Ambulatory Visit: Payer: Self-pay

## 2021-04-26 ENCOUNTER — Ambulatory Visit (INDEPENDENT_AMBULATORY_CARE_PROVIDER_SITE_OTHER): Payer: 59 | Admitting: Internal Medicine

## 2021-04-26 ENCOUNTER — Encounter: Payer: Self-pay | Admitting: Internal Medicine

## 2021-04-26 DIAGNOSIS — Z1211 Encounter for screening for malignant neoplasm of colon: Secondary | ICD-10-CM

## 2021-04-26 DIAGNOSIS — M48062 Spinal stenosis, lumbar region with neurogenic claudication: Secondary | ICD-10-CM | POA: Diagnosis not present

## 2021-04-26 DIAGNOSIS — F411 Generalized anxiety disorder: Secondary | ICD-10-CM

## 2021-04-26 MED ORDER — PREGABALIN 100 MG PO CAPS: 100.0000 mg | ORAL_CAPSULE | Freq: Two times a day (BID) | ORAL | 2 refills | Status: DC

## 2021-04-26 MED ORDER — DIAZEPAM 5 MG PO TABS: 5.0000 mg | ORAL_TABLET | Freq: Every evening | ORAL | 0 refills | Status: DC | PRN

## 2021-04-26 NOTE — Patient Instructions (Signed)
Please start taking Lyrica 100 mg twice daily instead of 75 mg.

## 2021-04-26 NOTE — Progress Notes (Signed)
Virtual Visit via Telephone Note   This visit type was conducted due to national recommendations for restrictions regarding the COVID-19 Pandemic (e.g. social distancing) in an effort to limit this patient's exposure and mitigate transmission in our community.  Due to her co-morbid illnesses, this patient is at least at moderate risk for complications without adequate follow up.  This format is felt to be most appropriate for this patient at this time.  The patient did not have access to video technology/had technical difficulties with video requiring transitioning to audio format only (telephone).  All issues noted in this document were discussed and addressed.  No physical exam could be performed with this format.  Evaluation Performed:  Follow-up visit  Date:  04/26/2021   ID:  Kathryn [name], DOB [birth year], MRN [id number]  Patient Location: Home Provider Location: Office/Clinic  Participants: Patient Location of Patient: Home Location of Provider: Telehealth Consent was obtain for visit to be over via telehealth. I verified that I am speaking with the correct person using two identifiers.  PCP:  Lindell Spar, MD   Chief Complaint: Leg pain and anxiety  History of Present Illness:    Kathryn [name] is a 51 y.o. female who has a televisit for follow-up of pain in her legs and feet.  She has been taking Lyrica for peripheral neuropathy and lumbar spinal stenosis related chronic low back pain.  She states that her pain is not controlled with current dose of Lyrica.  She denies any recent fall or injury.  Denies any saddle anesthesia, urinary or stool incontinence.  In the last visit, she was given a refill of Valium for anxiety.  She has not been able to see psychiatry yet and requests a refill of Valium for now.  Denies any SI or HI currently.  The patient does not have symptoms concerning for COVID-19 infection (fever, chills, cough, or new shortness of breath).   Past  Medical, Surgical, Social History, Allergies, and Medications have been Reviewed.  Past Medical History:  Diagnosis Date   Anxiety    Arthritis    Asthma    Bipolar disorder ([place])    Chronic back pain    COPD (chronic obstructive pulmonary disease) (HCC)    Chronic bronchitis   Depression    GERD (gastroesophageal reflux disease)    Hypertension    Neuropathy    Osteoarthritis of left knee, 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   Sleep apnea 04/2020   GETTING A cpap   Suicidal ideation 12/24/2018   Past Surgical History:  Procedure Laterality Date   BACK SURGERY     DILATATION AND CURETTAGE/HYSTEROSCOPY WITH MINERVA N/A 06/09/2020   Procedure: DILATATION AND CURETTAGE/HYSTEROSCOPY WITH MINERVA;  Surgeon: Florian Buff, MD;  Location: [facility];  Service: Gynecology;  Laterality: N/A;   ESOPHAGOGASTRODUODENOSCOPY (EGD) WITH PROPOFOL N/A 12/25/2018   Procedure: ESOPHAGOGASTRODUODENOSCOPY (EGD) WITH PROPOFOL;  Surgeon: Rogene Houston, MD;  Location: [facility];  Service: Endoscopy;  Laterality: N/A;   PATELLA-FEMORAL ARTHROPLASTY Left 10/08/2018   Procedure: PATELLA-FEMORAL ARTHROPLASTY;  Surgeon: Marchia Bond, MD;  Location: [facility];  Service: Orthopedics;  Laterality: Left;   TUBAL LIGATION       Current Meds  Medication Sig   albuterol (PROVENTIL) (2.5 MG/3ML) 0.083% nebulizer solution Take 2.5 mg by nebulization every 6 (six) hours as needed for wheezing or shortness of breath.   amLODipine (NORVASC) 5 MG tablet Take 1 tablet (5 mg total) by mouth daily. for blood pressure  apixaban (ELIQUIS) 5 MG TABS tablet Take 1 tablet (5 mg total) by mouth 2 (two) times daily.   Cholecalciferol (VITAMIN D) 50 MCG (2000 UT) CAPS Take 1 capsule by mouth daily.   cloNIDine (CATAPRES) 0.1 MG tablet Take 1 tablet (0.1 mg total) by mouth daily.   Cyanocobalamin (VITAMIN B 12 PO) Take 1 tablet by mouth daily.   DULoxetine (CYMBALTA) 30 MG capsule Take 1 capsule (30 mg total) by mouth 2 (two) times daily.    fluticasone-salmeterol (ADVAIR) 100-50 MCG/ACT AEPB Inhale 1 puff into the lungs 2 (two) times daily.   furosemide (LASIX) 20 MG tablet Take 1 tablet (20 mg total) by mouth daily.   hydrocortisone (ANUSOL-HC) 2.5 % rectal cream Place 1 application rectally 2 (two) times daily.   hydrOXYzine (VISTARIL) 50 MG capsule Take 1 capsule (50 mg total) by mouth daily as needed for anxiety (Insomnia).   methocarbamol (ROBAXIN) 500 MG tablet Take 1 tablet (500 mg total) by mouth 2 (two) times daily.   Misc. Devices MISC Rolling walker - ICD10: M48.061, Z98.1, M17.9   omeprazole (PRILOSEC) 20 MG capsule Take 1 capsule (20 mg total) by mouth daily.   potassium chloride SA (KLOR-CON M) 20 MEQ tablet Take 1 tablet (20 mEq total) by mouth daily.   sucralfate (CARAFATE) 1 g tablet Take 1 tablet (1 g total) by mouth 4 (four) times daily -  with meals and at bedtime.   [DISCONTINUED] diazepam (VALIUM) 5 MG tablet Take 1 tablet (5 mg total) by mouth at bedtime as needed for anxiety.   [DISCONTINUED] oxyCODONE (ROXICODONE) 5 MG immediate release tablet Take 1 tablet (5 mg total) by mouth every 6 (six) hours as needed for severe pain.   [DISCONTINUED] pregabalin (LYRICA) 75 MG capsule Take 1 capsule (75 mg total) by mouth 2 (two) times daily.     Allergies:   Clonopin [clonazepam], Fish allergy, Flexeril [cyclobenzaprine hcl], Ibuprofen, Shellfish allergy, Tylenol [acetaminophen], Ace inhibitors, Cyclobenzaprine, Tramadol, and Trazodone and nefazodone   ROS:   Please see the history of present illness.     All other systems reviewed and are negative.   Labs/Other Tests and Data Reviewed:    Recent Labs: 02/21/2021: B Natriuretic Peptide 32.2 04/05/2021: ALT 25; BUN 15; Creatinine, Ser 0.59; Hemoglobin 11.9; Platelets 596; Potassium 4.5; Sodium 138   Recent Lipid Panel Lab Results  Component Value Date/Time   CHOL 155 04/20/2017 09:55 AM   TRIG 46 04/20/2017 09:55 AM   HDL 70 04/20/2017 09:55 AM    CHOLHDL 2.2 04/20/2017 09:55 AM   LDLCALC 72 04/20/2017 09:55 AM    Wt Readings from Last 3 Encounters:  04/05/21 210 lb (95.3 kg)  03/24/21 202 lb 1.3 oz (91.7 kg)  03/22/21 210 lb (95.3 kg)     ASSESSMENT & PLAN:    Lumbar spinal stenosis H/o chronic low back pain S/p lumbar fusion surgery - L3-L4 F/u with Spine surgery Increased dose of Lyrica to 100 mg BID  GAD (generalized anxiety disorder) Refilled Vistaril and Valium for now Had referred to psychiatry - awaiting for appointment  Time:   Today, I have spent 13 minutes reviewing the chart, including problem list, medications, and with the patient with telehealth technology discussing the above problems.   Medication Adjustments/Labs and Tests Ordered: Current medicines are reviewed at length with the patient today.  Concerns regarding medicines are outlined above.   Tests Ordered: Orders Placed This Encounter  Procedures   Cologuard    Medication Changes: Meds ordered  this encounter  Medications   pregabalin (LYRICA) 100 MG capsule    Sig: Take 1 capsule (100 mg total) by mouth 2 (two) times daily.    Dispense:  60 capsule    Refill:  2   diazepam (VALIUM) 5 MG tablet    Sig: Take 1 tablet (5 mg total) by mouth at bedtime as needed for anxiety.    Dispense:  30 tablet    Refill:  0     Note: This dictation was prepared with Dragon dictation along with smaller phrase technology. Similar sounding words can be transcribed inadequately or may not be corrected upon review. Any transcriptional errors that result from this process are unintentional.      Disposition:  Follow up  Signed, Lindell Spar, MD  04/26/2021 2:47 PM     [place]

## 2021-04-26 NOTE — Assessment & Plan Note (Signed)
H/o chronic low back pain S/p lumbar fusion surgery - L3-L4 F/u with Spine surgery Increased dose of Lyrica to 100 mg BID

## 2021-04-26 NOTE — Assessment & Plan Note (Addendum)
Refilled Vistaril and Valium for now Had referred to psychiatry - awaiting for appointment

## 2021-04-27 NOTE — Telephone Encounter (Signed)
Next Appt With Pulmonology Lanier Clam, MD) 05/17/2021 at 9:00 AM

## 2021-05-06 ENCOUNTER — Inpatient Hospital Stay (HOSPITAL_COMMUNITY): Payer: 59 | Attending: Hematology | Admitting: Hematology

## 2021-05-16 NOTE — Telephone Encounter (Signed)
Patient pick-up forms.

## 2021-05-17 ENCOUNTER — Encounter: Payer: Self-pay | Admitting: Pulmonary Disease

## 2021-05-17 ENCOUNTER — Ambulatory Visit (INDEPENDENT_AMBULATORY_CARE_PROVIDER_SITE_OTHER): Payer: 59 | Admitting: Pulmonary Disease

## 2021-05-17 ENCOUNTER — Ambulatory Visit (INDEPENDENT_AMBULATORY_CARE_PROVIDER_SITE_OTHER): Payer: 59

## 2021-05-17 ENCOUNTER — Other Ambulatory Visit: Payer: Self-pay

## 2021-05-17 VITALS — BP 122/74 | HR 84 | Temp 98.2°F | Ht 59.0 in | Wt 202.0 lb

## 2021-05-17 DIAGNOSIS — R0782 Intercostal pain: Secondary | ICD-10-CM

## 2021-05-17 DIAGNOSIS — J4540 Moderate persistent asthma, uncomplicated: Secondary | ICD-10-CM | POA: Diagnosis not present

## 2021-05-17 DIAGNOSIS — Z01818 Encounter for other preprocedural examination: Secondary | ICD-10-CM | POA: Diagnosis not present

## 2021-05-17 MED ORDER — FLUTICASONE-SALMETEROL 250-50 MCG/ACT IN AEPB: 1.0000 | INHALATION_SPRAY | Freq: Two times a day (BID) | RESPIRATORY_TRACT | 2 refills | Status: DC

## 2021-05-17 NOTE — Patient Instructions (Signed)
Nice to meet you  I recommend we increase the Wixela to the mid dose, a new prescription was sent today.  Please rinse your mouth out after every use.  Use 1 puff twice a day.  Continue albuterol twice a day.  In a couple of weeks or after your surgery, consider using albuterol only as needed if your breathing has improved.  I will send a note to your surgeon.  I think it is okay to go forward with your knee operation.  Return to clinic in 6 months or sooner as needed with Dr. Silas Flood

## 2021-05-17 NOTE — Progress Notes (Signed)
@Patient  ID: Kathryn [name], female    DOB: [birth year], 51 y.o.   MRN: [id number]  Chief Complaint  Patient presents with   surgical clearance    Here to get cleared for right knee surgery.     Referring provider: Lindell Spar, MD  HPI:   51 y.o. woman whom we are seeing in consultation for evaluation of asthma as well as preoperative evaluation prior to knee surgery.  Note from PCP reviewed.  Overall she is doing well.  Denies significant dyspnea.  Diagnosed with asthma many years ago.  She uses Wixela low-dose 1 puff twice a day.  Reports good adherence to this medication.  In addition, she is instructed to use albuterol nebulizer twice a day.  Not using as needed but scheduled.  Has been doing this for quite some time.  Has some baseline dyspnea but nothing worse than normal.  Occasional cough but nothing bad.  Unfortunately, developed cough over the weekend.  Some bilateral flank or chest pain.  Located under breasts radiating posteriorly.  Worse with cough or deep breaths.  She denies any fever, runny nose, nasal congestion, sinus congestion, sore throat, productive cough.  Shortness of breath no worse.  Reviewed most recent chest imaging chest x-ray 02/2021 that on my review interpretation reveals clear lungs bilaterally.  CTA PE protocol 02/2021 reviewed which shows mild thickening of the bronchial scattered throughout, areas of mosaicism suggestive of small airways disease or asthma on my interpretation.  PMH: Hypertension, asthma, allergies, Surgical history: Back surgery 2022, tubal ligation 1991 Family history: She reports history of asthma, blood clotting disorders Social history: Former smoker, April 19, 2021, lives in [place] / Pulmonary Flowsheets:   ACT:  No flowsheet data found.  MMRC: No flowsheet data found.  Epworth:  No flowsheet data found.  Tests:   FENO:  No results found for: NITRICOXIDE  PFT: No flowsheet data  found.  WALK:  No flowsheet data found.  Imaging: Personally reviewed as per EMR discussion this note  Lab Results: Personally reviewed CBC    Component Value Date/Time   WBC 8.6 04/05/2021 0820   RBC 4.26 04/05/2021 0820   HGB 11.9 (L) 04/05/2021 0820   HGB 13.1 04/26/2020 1650   HCT 37.7 04/05/2021 0820   HCT 39.6 04/26/2020 1650   PLT 596 (H) 04/05/2021 0820   PLT 624 (H) 04/26/2020 1650   MCV 88.5 04/05/2021 0820   MCV 89 04/26/2020 1650   MCH 27.9 04/05/2021 0820   MCHC 31.6 04/05/2021 0820   RDW 16.6 (H) 04/05/2021 0820   RDW 13.3 04/26/2020 1650   LYMPHSABS 2.2 03/04/2021 1008   LYMPHSABS 3.5 (H) 04/26/2020 1650   MONOABS 0.4 03/04/2021 1008   EOSABS 0.0 03/04/2021 1008   EOSABS 0.1 04/26/2020 1650   BASOSABS 0.0 03/04/2021 1008   BASOSABS 0.0 04/26/2020 1650    BMET    Component Value Date/Time   NA 138 04/05/2021 0820   K 4.5 04/05/2021 0820   CL 104 04/05/2021 0820   CO2 26 04/05/2021 0820   GLUCOSE 95 04/05/2021 0820   BUN 15 04/05/2021 0820   CREATININE 0.59 04/05/2021 0820   CREATININE 0.74 04/20/2017 0955   CALCIUM 9.4 04/05/2021 0820   GFRNONAA >60 04/05/2021 0820   GFRNONAA 96 04/20/2017 0955   GFRAA >60 12/26/2018 0551   GFRAA 111 04/20/2017 0955    BNP    Component Value Date/Time   BNP 32.2 02/21/2021 1659    ProBNP No  results found for: PROBNP  Specialty Problems       Pulmonary Problems   Snoring   Moderate persistent asthma without complication    Allergies  Allergen Reactions   Clonopin [Clonazepam] Anaphylaxis    Swelling of the tongue and mouth.    Fish Allergy Anaphylaxis, Shortness Of Breath and Swelling   Flexeril [Cyclobenzaprine Hcl] Shortness Of Breath   Ibuprofen Anaphylaxis, Hives and Other (See Comments)   Shellfish Allergy Anaphylaxis   Tylenol [Acetaminophen] Anaphylaxis   Ace Inhibitors Cough   Cyclobenzaprine    Tramadol Nausea And Vomiting    Upset stomach   Trazodone And Nefazodone Hives     Immunization History  Administered Date(s) Administered   Influenza,inj,Quad PF,6+ Mos 01/11/2017, 12/25/2017, 01/30/2018, 12/26/2018, 03/19/2020, 01/17/2021   Moderna Sars-Covid-2 Vaccination 09/18/2019, 10/28/2019   Pneumococcal Polysaccharide-23 10/09/2018   Tdap 05/11/2017   Zoster Recombinat (Shingrix) 11/18/2020, 03/24/2021    Past Medical History:  Diagnosis Date   Anxiety    Arthritis    Asthma    Bipolar disorder ([place])    Chronic back pain    COPD (chronic obstructive pulmonary disease) (HCC)    Chronic bronchitis   Depression    GERD (gastroesophageal reflux disease)    Hypertension    Neuropathy    Osteoarthritis of left knee, 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   amLODipine (NORVASC) 5 MG tablet Take 1 tablet (5 mg total) by mouth daily. for blood pressure   apixaban (ELIQUIS) 5 MG TABS tablet Take 1 tablet (5 mg total) by mouth 2 (two) times daily.   Cholecalciferol (VITAMIN D) 50 MCG (2000 UT) CAPS Take 1 capsule by mouth daily.   citalopram (CELEXA) 20 MG tablet Take 20 mg by mouth daily.   cloNIDine (CATAPRES) 0.1 MG tablet Take 1 tablet (0.1 mg total) by mouth daily.   Cyanocobalamin (VITAMIN B 12 PO) Take 1 tablet by mouth daily.   diazepam (VALIUM) 5 MG tablet Take 1 tablet (5 mg total) by mouth at bedtime as needed for anxiety.   DULoxetine (CYMBALTA) 30 MG capsule Take 1 capsule (30 mg total) by mouth 2 (two) times daily.    fluticasone-salmeterol (WIXELA INHUB) 250-50 MCG/ACT AEPB Inhale 1 puff into the lungs in the morning and at bedtime.   hydrocortisone (ANUSOL-HC) 2.5 % rectal cream Place 1 application rectally 2 (two) times daily.   hydrOXYzine (VISTARIL) 50 MG capsule Take 1 capsule (50 mg total) by mouth daily as needed for anxiety (Insomnia).   methocarbamol (ROBAXIN) 500 MG tablet Take 1 tablet (500 mg total) by mouth 2 (two) times daily.   Misc. Devices MISC Rolling walker - ICD10: M48.061, Z98.1, M17.9   omeprazole (PRILOSEC) 20 MG capsule Take 1 capsule (20 mg total) by mouth daily.   potassium chloride SA (KLOR-CON M) 20 MEQ tablet Take 1 tablet (20 mEq total) by mouth daily.   pregabalin (LYRICA) 100 MG capsule Take 1 capsule (100 mg total) by mouth  2 (two) times daily.   risperiDONE (RISPERDAL) 0.5 MG tablet Take 0.5 mg by mouth 2 (two) times daily.   sucralfate (CARAFATE) 1 g tablet Take 1 tablet (1 g total) by mouth 4 (four) times daily -  with meals and at bedtime.   traZODone (DESYREL) 100 MG tablet Take 100 mg by mouth at bedtime.   [DISCONTINUED] fluticasone-salmeterol (ADVAIR) 100-50 MCG/ACT AEPB Inhale 1 puff into the lungs 2 (two) times daily.   furosemide (LASIX) 20 MG tablet Take 1 tablet (20 mg total) by mouth daily.   No facility-administered encounter medications on file as of 05/17/2021.     Review of Systems  Review of Systems  No chest pain with exertion.  No orthopnea or PND.  Comprehensive review of systems otherwise negative. Physical Exam  BP 122/74 (BP Location: Left Arm, Patient Position: Sitting, Cuff Size: Normal)    Pulse 84    Temp 98.2 F (36.8 C) (Oral)    Ht 4\' 11"  (1.499 m)    Wt 202 lb (91.6 kg)    LMP 06/07/2020    SpO2 100%    BMI 40.80 kg/m   Wt Readings from Last 5 Encounters:  05/17/21 202 lb (91.6 kg)  04/05/21 210 lb (95.3 kg)  03/24/21 202 lb 1.3 oz (91.7 kg)  03/22/21 210 lb (95.3 kg)  03/17/21 204 lb (92.5 kg)    BMI Readings from Last 5  Encounters:  05/17/21 40.80 kg/m  04/05/21 42.41 kg/m  03/24/21 40.82 kg/m  03/22/21 42.41 kg/m  03/17/21 41.20 kg/m     Physical Exam General: Sitting in chair, no acute distress Eyes: EOMI, no icterus Neck: Supple, no JVD appreciated sitting upright Pulmonary: Clear, normal work of breathing, no wheeze Cardiovascular: Regular in rhythm, no murmur Abdomen: Nondistended, bowel sounds present MSK: No synovitis, no joint effusion Neuro: Ambulates with assistance of cane, no weakness Psych: Normal mood, full affect  Assessment & Plan:   Asthma: Relatively well controlled, low-dose Wixela.  Using scheduled albuterol nebulizer.  Worsening cough recently concerning for signs of poorly controlled asthma.  Escalate to mid dose Wixela and see if we can decrease scheduled albuterol nebulizer to only as needed.  250 mcg dose new prescription sent to pharmacy today.  Chest pain: Over the last 2 or 3 days.  Onset of worsening cough.  No fever, runny nose, sore throat.  Likely musculoskeletal in the setting of coughing.  Chest x-ray for further evaluation.  Preoperative evaluation: Pulmonary medicine does not provide preoperative clearance rather preoperative restratification.  Based on the ARISCAT model and assuming a surgical duration of less than 3 hours, patient is low risk of 1.6% risk of postop pulmonary complication.  There are no modifiable risk factors.  Please see below for recommendations. --Consider local anesthesia, nerve blocks as able to avoid general anesthesia --If general anesthesia is used, minimize time of neuromuscular blockade --Provide duo nebs in the preop area and in the PACU --Check blood gas intraoperatively if general anesthesia is used to assess for CO2 retention --If evidence of CO2 retention on blood gas, recommend extubation to BiPAP  Return in about 6 months (around 11/14/2021).   Lanier Clam, MD 05/17/2021

## 2021-05-17 NOTE — Telephone Encounter (Signed)
OV notes and clearance form have been faxed back to [facility]. Nothing further needed at this time.  °

## 2021-05-18 ENCOUNTER — Other Ambulatory Visit: Payer: Self-pay | Admitting: Internal Medicine

## 2021-05-23 ENCOUNTER — Emergency Department (HOSPITAL_COMMUNITY)
Admission: EM | Admit: 2021-05-23 | Discharge: 2021-05-23 | Disposition: A | Discharge status: Home / Self Care | Payer: Medicaid Other | Attending: Emergency Medicine | Admitting: Emergency Medicine

## 2021-05-23 ENCOUNTER — Other Ambulatory Visit: Payer: Self-pay

## 2021-05-23 ENCOUNTER — Encounter (HOSPITAL_COMMUNITY): Payer: Self-pay | Admitting: *Deleted

## 2021-05-23 ENCOUNTER — Emergency Department (HOSPITAL_COMMUNITY): Payer: Medicaid Other

## 2021-05-23 DIAGNOSIS — I10 Essential (primary) hypertension: Secondary | ICD-10-CM | POA: Insufficient documentation

## 2021-05-23 DIAGNOSIS — J449 Chronic obstructive pulmonary disease, unspecified: Secondary | ICD-10-CM | POA: Diagnosis not present

## 2021-05-23 DIAGNOSIS — R2243 Localized swelling, mass and lump, lower limb, bilateral: Secondary | ICD-10-CM | POA: Diagnosis present

## 2021-05-23 DIAGNOSIS — R202 Paresthesia of skin: Secondary | ICD-10-CM | POA: Diagnosis not present

## 2021-05-23 DIAGNOSIS — R1084 Generalized abdominal pain: Secondary | ICD-10-CM | POA: Insufficient documentation

## 2021-05-23 DIAGNOSIS — R0789 Other chest pain: Secondary | ICD-10-CM | POA: Insufficient documentation

## 2021-05-23 DIAGNOSIS — Z79899 Other long term (current) drug therapy: Secondary | ICD-10-CM | POA: Insufficient documentation

## 2021-05-23 DIAGNOSIS — M545 Low back pain, unspecified: Secondary | ICD-10-CM | POA: Insufficient documentation

## 2021-05-23 DIAGNOSIS — M79604 Pain in right leg: Secondary | ICD-10-CM

## 2021-05-23 DIAGNOSIS — M7989 Other specified soft tissue disorders: Secondary | ICD-10-CM

## 2021-05-23 DIAGNOSIS — Z20822 Contact with and (suspected) exposure to covid-19: Secondary | ICD-10-CM | POA: Insufficient documentation

## 2021-05-23 DIAGNOSIS — Z7901 Long term (current) use of anticoagulants: Secondary | ICD-10-CM | POA: Insufficient documentation

## 2021-05-23 DIAGNOSIS — F17200 Nicotine dependence, unspecified, uncomplicated: Secondary | ICD-10-CM | POA: Diagnosis not present

## 2021-05-23 LAB — CBC
HCT: 37 % (ref 36.0–46.0)
Hemoglobin: 11.5 g/dL — ABNORMAL LOW (ref 12.0–15.0)
MCH: 27.1 pg (ref 26.0–34.0)
MCHC: 31.1 g/dL (ref 30.0–36.0)
MCV: 87.3 fL (ref 80.0–100.0)
Platelets: 561 10*3/uL — ABNORMAL HIGH (ref 150–400)
RBC: 4.24 MIL/uL (ref 3.87–5.11)
RDW: 15.6 % — ABNORMAL HIGH (ref 11.5–15.5)
WBC: 7.8 10*3/uL (ref 4.0–10.5)
nRBC: 0 % (ref 0.0–0.2)

## 2021-05-23 LAB — URINALYSIS, ROUTINE W REFLEX MICROSCOPIC
Bilirubin Urine: NEGATIVE
Glucose, UA: NEGATIVE mg/dL
Hgb urine dipstick: NEGATIVE
Ketones, ur: NEGATIVE mg/dL
Leukocytes,Ua: NEGATIVE
Nitrite: NEGATIVE
Protein, ur: NEGATIVE mg/dL
Specific Gravity, Urine: >1.03 — ABNORMAL HIGH (ref 1.005–1.030)
pH: 6 (ref 5.0–8.0)

## 2021-05-23 LAB — CK: Total CK: 157 U/L (ref 38–234)

## 2021-05-23 LAB — COMPREHENSIVE METABOLIC PANEL
ALT: 22 U/L (ref 0–44)
AST: 19 U/L (ref 15–41)
Albumin: 3.6 g/dL (ref 3.5–5.0)
Alkaline Phosphatase: 86 U/L (ref 38–126)
Anion gap: 11 (ref 5–15)
BUN: 9 mg/dL (ref 6–20)
CO2: 20 mmol/L — ABNORMAL LOW (ref 22–32)
Calcium: 8.8 mg/dL — ABNORMAL LOW (ref 8.9–10.3)
Chloride: 105 mmol/L (ref 98–111)
Creatinine, Ser: 0.71 mg/dL (ref 0.44–1.00)
GFR, Estimated: >60 mL/min (ref >60)
Glucose, Bld: 75 mg/dL (ref 70–99)
Potassium: 3.6 mmol/L (ref 3.5–5.1)
Sodium: 136 mmol/L (ref 135–145)
Total Bilirubin: 0.4 mg/dL (ref 0.3–1.2)
Total Protein: 7.1 g/dL (ref 6.5–8.1)

## 2021-05-23 LAB — RESP PANEL BY RT-PCR (FLU A&B, COVID) ARPGX2
Influenza A by PCR: NEGATIVE
Influenza B by PCR: NEGATIVE
SARS Coronavirus 2 by RT PCR: NEGATIVE

## 2021-05-23 LAB — LIPASE, BLOOD: Lipase: 38 U/L (ref 11–51)

## 2021-05-23 LAB — TROPONIN I (HIGH SENSITIVITY)
Troponin I (High Sensitivity): 2 ng/L (ref <18)
Troponin I (High Sensitivity): 3 ng/L (ref <18)

## 2021-05-23 LAB — BRAIN NATRIURETIC PEPTIDE: B Natriuretic Peptide: 63 pg/mL (ref 0.0–100.0)

## 2021-05-23 MED ORDER — KETOROLAC TROMETHAMINE 30 MG/ML IJ SOLN
30.0000 mg | Freq: Once | INTRAMUSCULAR | Status: AC
  Administered 2021-05-23: 30 mg via INTRAVENOUS
  Filled 2021-05-23: qty 1

## 2021-05-23 MED ORDER — OXYCODONE HCL 5 MG PO TABS
5.0000 mg | ORAL_TABLET | Freq: Once | ORAL | Status: AC
  Administered 2021-05-23: 5 mg via ORAL
  Filled 2021-05-23: qty 1

## 2021-05-23 NOTE — ED Notes (Signed)
Patient given water, graham crackers and peanut butter.

## 2021-05-23 NOTE — Discharge Instructions (Addendum)
We did not find any concerning findings on your labs or imaging to admit you to the hospital. I have provided you a Family practice for you to call and set up an appointment to establish care. Please return to the ED if you develop further worsening symptoms.

## 2021-05-23 NOTE — ED Provider Notes (Signed)
[facility] Provider Note   CSN: [id number] Arrival date & time: 05/23/21  0623     History  Chief Complaint  Patient presents with   Leg Pain    Kathryn [name] is a 51 y.o. female.  Past medical history includes hypertension, obesity, history of DVT, history of pulmonary embolism on Eliquis, COPD. Patient presents the emergency department with multiple complaints.    Leg swelling: Patient states that she noticed that both of her legs have started to swell. Right leg is more swollen than left. She is complaining of tenderness in bilateral feet up until mid thigh. No redness, color changes, or numbness.   Body Myalgias: Patient is complaining of multiple painful areas starting 2-3 days ago. Along with the bilateral lower ext pain that is outlined in the paragraph above, she is having bilateral groin, generalized abdominal, bilateral flank pain, lower back pain, and chest pain. Chest pain is described as left sided, worse when taking deep breaths, and better when she is at rest. No radiation. Patient also feels like she is having pins and needles sensations in bilateral legs up to mid thigh as well as bilateral upper extremities up to forearms. These sensations are not worsening or going further up extremities. Body myalgias are present with associated generalized weakness.    Leg Pain Associated symptoms: back pain   Associated symptoms: no fever       Home Medications Prior to Admission medications   Medication Sig Start Date End Date Taking? Authorizing Provider  albuterol (PROVENTIL) (2.5 MG/3ML) 0.083% nebulizer solution Take 2.5 mg by nebulization every 6 (six) hours as needed for wheezing or shortness of breath.   Yes [provider]  amLODipine (NORVASC) 5 MG tablet Take 1 tablet (5 mg total) by mouth daily. for blood pressure 03/24/21  Yes Patel, Colin Broach, MD  apixaban (ELIQUIS) 5 MG TABS tablet Take 1 tablet (5 mg total) by mouth 2 (two) times  daily. 02/22/21  Yes Arrien, Jimmy Picket, MD  Cholecalciferol (VITAMIN D) 50 MCG (2000 UT) CAPS Take 1 capsule by mouth daily.   Yes [provider]  citalopram (CELEXA) 20 MG tablet Take 20 mg by mouth daily. 05/16/21  Yes [provider]  Cyanocobalamin (VITAMIN B 12 PO) Take 1 tablet by mouth daily.   Yes [provider]  diazepam (VALIUM) 5 MG tablet Take 1 tablet (5 mg total) by mouth at bedtime as needed for anxiety. 04/26/21  Yes Lindell Spar, MD  DULoxetine (CYMBALTA) 30 MG capsule Take 1 capsule (30 mg total) by mouth 2 (two) times daily. 03/24/21  Yes Patel, Colin Broach, MD  fluticasone-salmeterol [facility] INHUB) 250-50 MCG/ACT AEPB Inhale 1 puff into the lungs in the morning and at bedtime. 05/17/21  Yes Hunsucker, Bonna Gains, MD  furosemide (LASIX) 20 MG tablet Take 1 tablet (20 mg total) by mouth daily. 03/31/21 05/23/21 Yes Lindell Spar, MD  hydrOXYzine (VISTARIL) 50 MG capsule Take 1 capsule (50 mg total) by mouth daily as needed for anxiety (Insomnia). Patient taking differently: Take 50 mg by mouth daily. 03/24/21  Yes Lindell Spar, MD  omeprazole (PRILOSEC) 20 MG capsule Take 1 capsule (20 mg total) by mouth daily. 03/24/21  Yes Lindell Spar, MD  potassium chloride SA (KLOR-CON M) 20 MEQ tablet TAKE (1) TABLET BY MOUTH ONCE A DAY. 05/18/21  Yes Lindell Spar, MD  pregabalin (LYRICA) 100 MG capsule Take 1 capsule (100 mg total) by mouth 2 (two)  times daily. 04/26/21  Yes Lindell Spar, MD  risperiDONE (RISPERDAL) 0.5 MG tablet Take 0.5 mg by mouth 2 (two) times daily. 05/16/21  Yes [provider]  sucralfate (CARAFATE) 1 g tablet Take 1 tablet (1 g total) by mouth 4 (four) times daily -  with meals and at bedtime. 04/05/21  Yes Varney Biles, MD  traZODone (DESYREL) 100 MG tablet Take 100 mg by mouth at bedtime. 05/16/21  Yes [provider]  cloNIDine (CATAPRES) 0.1 MG tablet Take 1 tablet (0.1 mg total) by mouth daily. Patient not  taking: Reported on 05/23/2021 03/24/21   Lindell Spar, MD  hydrocortisone (ANUSOL-HC) 2.5 % rectal cream Place 1 application rectally 2 (two) times daily. 11/18/20   Lindell Spar, MD  methocarbamol (ROBAXIN) 500 MG tablet Take 1 tablet (500 mg total) by mouth 2 (two) times daily. Patient not taking: Reported on 05/23/2021 03/22/21   Mickie Hillier, PA-C  Misc. Devices MISC Rolling walker - ICD10: M48.061, Z98.1, M17.9 03/24/21   Lindell Spar, MD      Allergies    Clonopin [clonazepam], Fish allergy, Flexeril [cyclobenzaprine hcl], Ibuprofen, Shellfish allergy, Tylenol [acetaminophen], Ace inhibitors, Cyclobenzaprine, Tramadol, and Trazodone and nefazodone    Review of Systems   Review of Systems  Constitutional:  Positive for chills. Negative for fever.  HENT:  Negative for congestion and sore throat.   Eyes:  Negative for visual disturbance.  Respiratory:  Positive for cough. Negative for shortness of breath.   Cardiovascular:  Positive for chest pain, palpitations and leg swelling.  Gastrointestinal:  Positive for abdominal pain and constipation. Negative for blood in stool, diarrhea, nausea and vomiting.  Genitourinary:  Positive for dysuria and flank pain.  Musculoskeletal:  Positive for back pain.  Neurological:  Positive for dizziness, weakness and numbness. Negative for tremors, syncope and speech difficulty.  All other systems reviewed and are negative.  Physical Exam Updated Vital Signs BP 123/84    Pulse 80    Temp 98.2 F (36.8 C) (Oral)    Resp 18    Ht 4\' 11"  (1.499 m)    Wt 91.6 kg    LMP 06/07/2020    SpO2 96%    BMI 40.80 kg/m  Physical Exam Vitals and nursing note reviewed.  Constitutional:      General: She is not in acute distress.    Appearance: Normal appearance. She is obese. She is not ill-appearing, toxic-appearing or diaphoretic.  HENT:     Head: Normocephalic and atraumatic.     Nose: No nasal deformity.     Mouth/Throat:     Lips: Pink. No lesions.      Mouth: No injury, lacerations, oral lesions or angioedema.     Pharynx: Uvula midline. No pharyngeal swelling or uvula swelling.  Eyes:     General: Gaze aligned appropriately. No scleral icterus.       Right eye: No discharge.        Left eye: No discharge.     Extraocular Movements: Extraocular movements intact.     Conjunctiva/sclera: Conjunctivae normal.     Right eye: Right conjunctiva is not injected. No exudate or hemorrhage.    Left eye: Left conjunctiva is not injected. No exudate or hemorrhage.    Pupils: Pupils are equal, round, and reactive to light.  Neck:     Comments: Anterior and lateral neck tenderness to palpation Cardiovascular:     Rate and Rhythm: Normal rate and regular rhythm.  Pulses: Normal pulses.          Radial pulses are 2+ on the right side and 2+ on the left side.       Femoral pulses are 2+ on the right side and 2+ on the left side.      Dorsalis pedis pulses are 2+ on the right side and 2+ on the left side.     Heart sounds: Normal heart sounds, S1 normal and S2 normal. Heart sounds not distant. No murmur heard.   No friction rub. No gallop. No S3 or S4 sounds.     Comments: RLE with edema > LLE, edema present up to mid thigh There is diffuse tenderness to bilateral legs up until mid thigh. There is also bilateral tenderness of groins around femoral pulse. No mass palpated.  Pulmonary:     Effort: Pulmonary effort is normal. No accessory muscle usage or respiratory distress.     Breath sounds: Normal breath sounds. No stridor. No wheezing, rhonchi or rales.  Chest:     Chest wall: No tenderness.  Breasts:    Left: Tenderness present.     Comments: Reproducible ttp to anterior and lateral chest wall Abdominal:     General: Abdomen is flat. There is no distension.     Palpations: Abdomen is soft. There is no mass or pulsatile mass.     Tenderness: There is abdominal tenderness. There is right CVA tenderness and left CVA tenderness. There is no  guarding or rebound.     Hernia: No hernia is present.     Comments: Generalized abdominal tenderness. No peritoneal signs  Musculoskeletal:     Cervical back: Neck supple. Tenderness present.     Right lower leg: 1+ Edema present.     Left lower leg: 1+ Edema present.     Comments: Subjective paresthesia up until mid forearm bilaterally and mid thigh bilaterally. No subjective numbness.   Skin:    General: Skin is warm and dry.     Coloration: Skin is not jaundiced or pale.     Findings: No bruising, erythema, lesion or rash.  Neurological:     General: No focal deficit present.     Mental Status: She is alert and oriented to person, place, and time.     GCS: GCS eye subscore is 4. GCS verbal subscore is 5. GCS motor subscore is 6.     Comments: Alert and Oriented x 3 Speech clear with no aphasia Cranial Nerve testing - PERRLA. EOM intact. No Nystagmus - Facial Sensation grossly intact - No facial asymmetry - Accessory Muscles intact Motor: - 5/5 motor strength in all four extremities.  - No pronator Drift - Normal tone Sensation: - Grossly intact in all four extremities.  Coordination:  - Finger to nose and heel to shin intact bilaterally - Gait without abnormality.   Psychiatric:        Mood and Affect: Mood normal.        Behavior: Behavior normal. Behavior is cooperative.    ED Results / Procedures / Treatments   Labs (all labs ordered are listed, but only abnormal results are displayed) Labs Reviewed  CBC - Abnormal; Notable for the following components:      Result Value   Hemoglobin 11.5 (*)    RDW 15.6 (*)    Platelets 561 (*)    All other components within normal limits  COMPREHENSIVE METABOLIC PANEL - Abnormal; Notable for the following components:   CO2 20 (*)  Calcium 8.8 (*)    All other components within normal limits  URINALYSIS, ROUTINE W REFLEX MICROSCOPIC - Abnormal; Notable for the following components:   Specific Gravity, Urine >1.030 (*)     All other components within normal limits  RESP PANEL BY RT-PCR (FLU A&B, COVID) ARPGX2  LIPASE, BLOOD  CK  BRAIN NATRIURETIC PEPTIDE  TROPONIN I (HIGH SENSITIVITY)  TROPONIN I (HIGH SENSITIVITY)    EKG None  Radiology US Venous Img Lower Bilateral (DVT)  Result Date: 05/23/2021 CLINICAL DATA:  Pain and swelling in both legs, smoker EXAM: BILATERAL LOWER EXTREMITY VENOUS DOPPLER ULTRASOUND TECHNIQUE: Gray-scale sonography with compression, as well as color and duplex ultrasound, were performed to evaluate the deep venous system(s) from the level of the common femoral vein through the popliteal and proximal calf veins. COMPARISON:  01/12/2021 FINDINGS: VENOUS Normal compressibility of BILATERAL common femoral, superficial femoral, and popliteal veins, as well as the visualized calf veins. Visualized portions of BILATERAL profunda femoral vein and great saphenous vein unremarkable. No filling defects to suggest DVT on grayscale or color Doppler imaging. Doppler waveforms show normal direction of venous flow, normal respiratory plasticity and response to augmentation. OTHER None. Limitations: none IMPRESSION: No evidence of deep venous thrombosis in either lower extremity. Electronically Signed   By: Lavonia Dana M.D.   On: 05/23/2021 12:12   DG Chest Port 1 View  Result Date: 05/23/2021 CLINICAL DATA:  Chest pain EXAM: PORTABLE CHEST 1 VIEW COMPARISON:  05/17/2021 FINDINGS: The heart size and mediastinal contours are within normal limits. Both lungs are clear. The visualized skeletal structures are unremarkable. IMPRESSION: No active disease. Electronically Signed   By: Kathreen Devoid M.D.   On: 05/23/2021 09:56    Procedures Procedures  Cardiac monitoring while in ED  Medications Ordered in ED Medications  ketorolac (TORADOL) 30 MG/ML injection 30 mg (30 mg Intravenous Given 05/23/21 1044)  oxyCODONE (Oxy IR/ROXICODONE) immediate release tablet 5 mg (5 mg Oral Given 05/23/21 1130)    ED  Course/ Medical Decision Making/ A&P                           Medical Decision Making Problems Addressed: Leg swelling: chronic illness or injury with exacerbation, progression, or side effects of treatment  Amount and/or Complexity of Data Reviewed External Data Reviewed: labs, radiology, ECG and notes.    Details: reviewed recent office visit notes. Compared labs, EKG, and imaging to prior Labs: ordered. Decision-making details documented in ED Course. Radiology: ordered and independent interpretation performed. Decision-making details documented in ED Course. ECG/medicine tests: ordered and independent interpretation performed. Decision-making details documented in ED Course.  Risk Prescription drug management. Decision regarding hospitalization.   This is a 51 y.o. female with a PMH of hypertension, obesity, history of DVT, history of pulmonary embolism on Eliquis, COPD who presents to the ED with multiple complaints including RLE edema > LLE, pleuritic chest pain, diffuse myalgias over entirety of body, difficult to discern a point location of generalized pain, stocking glove neuropathy symptoms.  The ddx is very broad in this patient. She is already on Eliquis but I want to r/u DVT with ultrasound in bilateral lower extremities. I will also get serial troponins to evaluate for ACS, Myocarditis, or demand ischemia. BNP to assess for heart failure.  Basic labs to assess for electrolyte derangements, severe anemia, kidney or liver issues, and other metabolic abnormalities. Lipase and UA. Also will add on CK since she is  having these diffuse myalgias. She is afebrile, but will add on viral panel. CXR to evaluate for PNA and EKG to screen for cardiac abnormality.   I personally reviewed all laboratory work and imaging. Abnormal results outlined below.  Stable anemia from baseline. Stable thrombocytosis noted. Minimal hypocalcemia. Lipase, BNP, CK, troponin x 2 are WNL UA with elevated spec  grav. Bilateral doppler is without evidence of DVT. CXR normal.   Impression: Labs and imaging are unrevealing. Patient is already anticoagulated and DVT negative so do not feel there is high yield in ordering PE study. Patient's symptoms are not really suggestive of PE. Chest pain is likely sequela of a viral illness given the widespread nature of patient's pain.   I treated patient's pain in the ED and at this point, I do not feel there is any indication for further workup or admission. I discussed this with the patient and told her to return if she develops any new or worsening symptoms. She is to follow up with a PCP. I have referred her to a new PCP in the area.   I have seen and evaluated this patient in conjunction with my attending physician who agrees and has made changes to the plan accordingly.  Portions of this note were generated with [employer]. Dictation errors may occur despite best attempts at proofreading.   Final Clinical Impression(s) / ED Diagnoses Final diagnoses:  Leg swelling    Rx / DC Orders ED Discharge Orders     None         Adolphus Birchwood, PA-C 05/23/21 2241    Milton Ferguson, MD 05/24/21 [phone]

## 2021-05-23 NOTE — ED Triage Notes (Signed)
Pt c/o right leg pain and swelling that started Sunday night, right groin pain that started last night and right neck pain that started Saturday night. Pt reports she currently has a DVT in right leg and is on Eliquis.

## 2021-05-25 LAB — COLOGUARD: COLOGUARD: NEGATIVE

## 2021-06-03 ENCOUNTER — Inpatient Hospital Stay (HOSPITAL_COMMUNITY): Payer: Medicaid Other | Attending: Hematology | Admitting: Hematology

## 2021-06-09 ENCOUNTER — Other Ambulatory Visit: Payer: Self-pay

## 2021-06-09 ENCOUNTER — Telehealth: Payer: Self-pay

## 2021-06-09 DIAGNOSIS — Z86718 Personal history of other venous thrombosis and embolism: Secondary | ICD-10-CM

## 2021-06-09 NOTE — Telephone Encounter (Signed)
Kathryn [name] called and said the place she was referred to for her blood clots will not take her due to x3 no shows when she had blood clots.  She needs a new referral to another provider for this.  Not sure who you would refer to and she need new referral entered.  Thanks

## 2021-06-09 NOTE — Telephone Encounter (Signed)
Referral entered for hematology/oncology in [place] since [facility] will not see her due to 3 no shows

## 2021-06-10 ENCOUNTER — Emergency Department (HOSPITAL_COMMUNITY)
Admission: EM | Admit: 2021-06-10 | Discharge: 2021-06-10 | Disposition: A | Discharge status: Home / Self Care | Payer: Medicaid Other | Attending: Emergency Medicine | Admitting: Emergency Medicine

## 2021-06-10 ENCOUNTER — Telehealth: Payer: Self-pay | Admitting: Physician Assistant

## 2021-06-10 ENCOUNTER — Encounter (HOSPITAL_COMMUNITY): Payer: Self-pay

## 2021-06-10 ENCOUNTER — Other Ambulatory Visit: Payer: Self-pay

## 2021-06-10 ENCOUNTER — Emergency Department (HOSPITAL_COMMUNITY): Payer: Medicaid Other

## 2021-06-10 DIAGNOSIS — R1031 Right lower quadrant pain: Secondary | ICD-10-CM | POA: Insufficient documentation

## 2021-06-10 DIAGNOSIS — Z7901 Long term (current) use of anticoagulants: Secondary | ICD-10-CM | POA: Diagnosis not present

## 2021-06-10 DIAGNOSIS — R109 Unspecified abdominal pain: Secondary | ICD-10-CM

## 2021-06-10 LAB — URINALYSIS, ROUTINE W REFLEX MICROSCOPIC
Bilirubin Urine: NEGATIVE
Glucose, UA: NEGATIVE mg/dL
Hgb urine dipstick: NEGATIVE
Ketones, ur: NEGATIVE mg/dL
Leukocytes,Ua: NEGATIVE
Nitrite: NEGATIVE
Specific Gravity, Urine: >1.03 — ABNORMAL HIGH (ref 1.005–1.030)
pH: 6 (ref 5.0–8.0)

## 2021-06-10 LAB — POC URINE PREG, ED: Preg Test, Ur: NEGATIVE

## 2021-06-10 LAB — COMPREHENSIVE METABOLIC PANEL
ALT: 19 U/L (ref 0–44)
AST: 21 U/L (ref 15–41)
Albumin: 4 g/dL (ref 3.5–5.0)
Alkaline Phosphatase: 74 U/L (ref 38–126)
Anion gap: 9 (ref 5–15)
BUN: 12 mg/dL (ref 6–20)
CO2: 23 mmol/L (ref 22–32)
Calcium: 9.1 mg/dL (ref 8.9–10.3)
Chloride: 106 mmol/L (ref 98–111)
Creatinine, Ser: 0.83 mg/dL (ref 0.44–1.00)
GFR, Estimated: >60 mL/min (ref >60)
Glucose, Bld: 91 mg/dL (ref 70–99)
Potassium: 3.4 mmol/L — ABNORMAL LOW (ref 3.5–5.1)
Sodium: 138 mmol/L (ref 135–145)
Total Bilirubin: 0.4 mg/dL (ref 0.3–1.2)
Total Protein: 8 g/dL (ref 6.5–8.1)

## 2021-06-10 LAB — URINALYSIS, MICROSCOPIC (REFLEX)

## 2021-06-10 LAB — CBC
HCT: 39.7 % (ref 36.0–46.0)
Hemoglobin: 12.9 g/dL (ref 12.0–15.0)
MCH: 27.8 pg (ref 26.0–34.0)
MCHC: 32.5 g/dL (ref 30.0–36.0)
MCV: 85.6 fL (ref 80.0–100.0)
Platelets: 752 10*3/uL — ABNORMAL HIGH (ref 150–400)
RBC: 4.64 MIL/uL (ref 3.87–5.11)
RDW: 15.3 % (ref 11.5–15.5)
WBC: 6.3 10*3/uL (ref 4.0–10.5)
nRBC: 0 % (ref 0.0–0.2)

## 2021-06-10 LAB — LIPASE, BLOOD: Lipase: 38 U/L (ref 11–51)

## 2021-06-10 LAB — POC OCCULT BLOOD, ED: Fecal Occult Bld: NEGATIVE

## 2021-06-10 MED ORDER — IOHEXOL 9 MG/ML PO SOLN
ORAL | Status: AC
  Administered 2021-06-10: 500 mL
  Filled 2021-06-10: qty 1000

## 2021-06-10 MED ORDER — ONDANSETRON 4 MG PO TBDP: 4.0000 mg | ORAL_TABLET | Freq: Three times a day (TID) | ORAL | 0 refills | Status: DC | PRN

## 2021-06-10 MED ORDER — IOHEXOL 300 MG/ML  SOLN
100.0000 mL | Freq: Once | INTRAMUSCULAR | Status: AC | PRN
  Administered 2021-06-10: 100 mL via INTRAVENOUS

## 2021-06-10 MED ORDER — ONDANSETRON HCL 4 MG/2ML IJ SOLN
4.0000 mg | Freq: Once | INTRAMUSCULAR | Status: AC
  Administered 2021-06-10: 4 mg via INTRAVENOUS
  Filled 2021-06-10: qty 2

## 2021-06-10 MED ORDER — LIDOCAINE VISCOUS HCL 2 % MT SOLN
15.0000 mL | Freq: Once | OROMUCOSAL | Status: AC
  Administered 2021-06-10: 15 mL via ORAL
  Filled 2021-06-10: qty 15

## 2021-06-10 MED ORDER — FAMOTIDINE 20 MG PO TABS
20.0000 mg | ORAL_TABLET | Freq: Once | ORAL | Status: AC
  Administered 2021-06-10: 20 mg via ORAL
  Filled 2021-06-10: qty 1

## 2021-06-10 MED ORDER — ALUM & MAG HYDROXIDE-SIMETH 200-200-20 MG/5ML PO SUSP
30.0000 mL | Freq: Once | ORAL | Status: AC
  Administered 2021-06-10: 30 mL via ORAL
  Filled 2021-06-10: qty 30

## 2021-06-10 MED ORDER — OXYCODONE HCL 5 MG PO TABS: 5.0000 mg | ORAL_TABLET | Freq: Three times a day (TID) | ORAL | 0 refills | Status: DC | PRN

## 2021-06-10 MED ORDER — HYDROMORPHONE HCL 1 MG/ML IJ SOLN
1.0000 mg | Freq: Once | INTRAMUSCULAR | Status: AC
  Administered 2021-06-10: 1 mg via INTRAVENOUS
  Filled 2021-06-10: qty 1

## 2021-06-10 NOTE — ED Provider Notes (Signed)
Assumed care of this patient from Dr. Eulis Foster  Patient is here with left lower quadrant abdominal pain, reporting some diarrhea for 3 days.  Also report of pressure in the rectum.  Dr. Eulis Foster reports that she had a benign rectal exam, no evidence of anal fissure or fecal impaction.  Patient was pending CT scan of the abdomen pelvis, which were performed and personally reviewed, showing no focal findings to explain the patient's GI symptoms and left lower quadrant tenderness.  With no leukocytosis and no persistent GI symptoms, I have a lower suspicion for mesenteric ischemia.  It is not clear what is causing her sensation of pressure in the rectum.  Her blood test have been unremarkable, no leukocytosis, no evidence of UTI, CMP within normal limits.  Fecal occult was negative.  Patient was given several rounds of pain medications in the ED with some improvement; advised to follow-up with her primary care doctor.   Kathryn Dusky, MD 06/11/21 1000

## 2021-06-10 NOTE — Telephone Encounter (Signed)
Scheduled appt per 2/23 referral. Pt is aware of appt date and time. Pt is aware to arrive 15 mins prior to appt time and to bring and updated insurance card. Pt is aware of appt location.   °

## 2021-06-10 NOTE — ED Provider Notes (Signed)
[facility] Provider Note   CSN: [id number] Arrival date & time: 06/10/21  1113     History  Chief Complaint  Patient presents with   Abdominal Pain    Kathryn [name] is a 51 y.o. female.  HPI She presents for evaluation of abdominal pain localized to right lower quadrant, present for at least a week.  She reports having loose bowel movements several times a day.  Stool is brown in color.  She is not having vomiting.  She denies fever, chills, cough or shortness of breath.  She denies similar problem in the past.  Home Medications Prior to Admission medications   Medication Sig Start Date End Date Taking? Authorizing Provider  albuterol (PROVENTIL) (2.5 MG/3ML) 0.083% nebulizer solution Take 2.5 mg by nebulization every 6 (six) hours as needed for wheezing or shortness of breath.    [provider]  amLODipine (NORVASC) 5 MG tablet Take 1 tablet (5 mg total) by mouth daily. for blood pressure 03/24/21   Lindell Spar, MD  apixaban (ELIQUIS) 5 MG TABS tablet Take 1 tablet (5 mg total) by mouth 2 (two) times daily. 02/22/21   Arrien, Jimmy Picket, MD  Cholecalciferol (VITAMIN D) 50 MCG (2000 UT) CAPS Take 1 capsule by mouth daily.    [provider]  citalopram (CELEXA) 20 MG tablet Take 20 mg by mouth daily. 05/16/21   [provider]  cloNIDine (CATAPRES) 0.1 MG tablet Take 1 tablet (0.1 mg total) by mouth daily. Patient not taking: Reported on 05/23/2021 03/24/21   Lindell Spar, MD  Cyanocobalamin (VITAMIN B 12 PO) Take 1 tablet by mouth daily.    [provider]  diazepam (VALIUM) 5 MG tablet Take 1 tablet (5 mg total) by mouth at bedtime as needed for anxiety. 04/26/21   Lindell Spar, MD  DULoxetine (CYMBALTA) 30 MG capsule Take 1 capsule (30 mg total) by mouth 2 (two) times daily. 03/24/21   Lindell Spar, MD  fluticasone-salmeterol [facility] INHUB) 250-50 MCG/ACT AEPB Inhale 1 puff into the lungs in the morning and at  bedtime. 05/17/21   Hunsucker, Bonna Gains, MD  furosemide (LASIX) 20 MG tablet Take 1 tablet (20 mg total) by mouth daily. 03/31/21 05/23/21  Lindell Spar, MD  hydrocortisone (ANUSOL-HC) 2.5 % rectal cream Place 1 application rectally 2 (two) times daily. 11/18/20   Lindell Spar, MD  hydrOXYzine (VISTARIL) 50 MG capsule Take 1 capsule (50 mg total) by mouth daily as needed for anxiety (Insomnia). Patient taking differently: Take 50 mg by mouth daily. 03/24/21   Lindell Spar, MD  methocarbamol (ROBAXIN) 500 MG tablet Take 1 tablet (500 mg total) by mouth 2 (two) times daily. Patient not taking: Reported on 05/23/2021 03/22/21   Mickie Hillier, PA-C  Misc. Devices MISC Rolling walker - ICD10: M48.061, Z98.1, M17.9 03/24/21   Lindell Spar, MD  omeprazole (PRILOSEC) 20 MG capsule Take 1 capsule (20 mg total) by mouth daily. 03/24/21   Lindell Spar, MD  potassium chloride SA (KLOR-CON M) 20 MEQ tablet TAKE (1) TABLET BY MOUTH ONCE A DAY. 05/18/21   Lindell Spar, MD  pregabalin (LYRICA) 100 MG capsule Take 1 capsule (100 mg total) by mouth 2 (two) times daily. 04/26/21   Lindell Spar, MD  risperiDONE (RISPERDAL) 0.5 MG tablet Take 0.5 mg by mouth 2 (two) times daily. 05/16/21   [provider]  sucralfate (CARAFATE) 1 g tablet Take 1 tablet (1  g total) by mouth 4 (four) times daily -  with meals and at bedtime. 04/05/21   Varney Biles, MD  traZODone (DESYREL) 100 MG tablet Take 100 mg by mouth at bedtime. 05/16/21   [provider]      Allergies    Clonopin [clonazepam], Fish allergy, Flexeril [cyclobenzaprine hcl], Ibuprofen, Shellfish allergy, Tylenol [acetaminophen], Ace inhibitors, Cyclobenzaprine, Tramadol, and Trazodone and nefazodone    Review of Systems   Review of Systems  Physical Exam Updated Vital Signs BP (!) 137/103 (BP Location: Left Arm)    Pulse 76    Temp 97.9 F (36.6 C) (Oral)    Resp 19    Ht 4\' 11"  (1.499 m)    Wt 90.7 kg    LMP 06/07/2020    SpO2  100%    BMI 40.40 kg/m  Physical Exam Vitals and nursing note reviewed.  Constitutional:      General: She is in acute distress.     Appearance: She is well-developed. She is obese. She is not ill-appearing, toxic-appearing or diaphoretic.  HENT:     Head: Normocephalic and atraumatic.     Nose: No congestion or rhinorrhea.     Mouth/Throat:     Mouth: Mucous membranes are moist.  Eyes:     Conjunctiva/sclera: Conjunctivae normal.     Pupils: Pupils are equal, round, and reactive to light.  Neck:     Trachea: Phonation normal.  Cardiovascular:     Rate and Rhythm: Normal rate and regular rhythm.  Pulmonary:     Effort: Pulmonary effort is normal.     Breath sounds: Normal breath sounds.  Chest:     Chest wall: No tenderness.  Abdominal:     General: There is no distension.     Palpations: Abdomen is soft.     Tenderness: There is abdominal tenderness (Right lower quadrant, moderate). There is no guarding.  Musculoskeletal:        General: Normal range of motion.     Cervical back: Normal range of motion and neck supple.  Skin:    General: Skin is warm and dry.  Neurological:     Mental Status: She is alert and oriented to person, place, and time.     Motor: No abnormal muscle tone.  Psychiatric:        Mood and Affect: Mood normal.        Behavior: Behavior normal.        Thought Content: Thought content normal.        Judgment: Judgment normal.    ED Results / Procedures / Treatments   Labs (all labs ordered are listed, but only abnormal results are displayed) Labs Reviewed  COMPREHENSIVE METABOLIC PANEL - Abnormal; Notable for the following components:      Result Value   Potassium 3.4 (*)    All other components within normal limits  CBC - Abnormal; Notable for the following components:   Platelets 752 (*)    All other components within normal limits  URINALYSIS, ROUTINE W REFLEX MICROSCOPIC - Abnormal; Notable for the following components:   Specific  Gravity, Urine >1.030 (*)    Protein, ur TRACE (*)    All other components within normal limits  URINALYSIS, MICROSCOPIC (REFLEX) - Abnormal; Notable for the following components:   Bacteria, UA RARE (*)    All other components within normal limits  LIPASE, BLOOD  POC URINE PREG, ED  POC OCCULT BLOOD, ED    EKG None  Radiology  No results found.  Procedures Procedures    Medications Ordered in ED Medications - No data to display  ED Course/ Medical Decision Making/ A&P Clinical Course as of 06/13/21 1326  Fri Jun 10, 2021  1550 IMPRESSION: 1. No acute abnormality in the abdomen or pelvis. 2. Leiomyomatous uterus. 3. 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 [MT]    Clinical Course User Index [MT] Wyvonnia Dusky, MD                           Medical Decision Making She is for evaluation of abdominal pain with loose bowel movements.  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  Patient arrives in severe pain requiring treatment. She had a recent ED evaluation, for multiple problems, during which she had Doppler imaging of the legs to rule out DVT.  She has ongoing swelling of the legs.  Amount and/or Complexity of Data Reviewed Independent Historian:     Details: She is a cogent historian Labs: ordered.    Details: CBC, metabolic panel, lipase-normal except potassium slightly low, platelets high, urine specific gravity high, rare bacteria in urine. Radiology: ordered and independent interpretation performed.    Details: CT abdomen pelvis-pending at the time of transition of care  Risk OTC drugs. Prescription drug management. Risk Details: Patient presenting with subacute abdominal pain and a sensation of rectal discomfort.  Her clinical findings are nonspecific and required evaluation with CT imaging and laboratory testing.  Initial laboratory tests are reassuring.  She seemed to  improve after treatment with medications in the ED.  CT pending and transition of care to oncoming team to evaluate after completion of evaluation.  Patient stable at the time of care transition.           Final Clinical Impression(s) / ED Diagnoses Final diagnoses:  None    Rx / DC Orders ED Discharge Orders     None         Daleen Bo, MD 06/13/21 1332

## 2021-06-10 NOTE — ED Triage Notes (Signed)
Pt to er, pt states that she is here for some abd pain and diarrhea.  Pt states that she has been having the diarrhea for the past three days, states that she now has pressure in her rectum.  Pt states that now she also feels dizzy and light headed.

## 2021-06-13 ENCOUNTER — Encounter (INDEPENDENT_AMBULATORY_CARE_PROVIDER_SITE_OTHER): Payer: Self-pay | Admitting: *Deleted

## 2021-06-19 ENCOUNTER — Other Ambulatory Visit: Payer: Self-pay

## 2021-06-19 ENCOUNTER — Emergency Department (HOSPITAL_COMMUNITY): Payer: Medicaid Other

## 2021-06-19 ENCOUNTER — Encounter (HOSPITAL_COMMUNITY): Payer: Self-pay

## 2021-06-19 ENCOUNTER — Emergency Department (HOSPITAL_COMMUNITY)
Admission: EM | Admit: 2021-06-19 | Discharge: 2021-06-20 | Disposition: A | Discharge status: Home / Self Care | Payer: Medicaid Other | Attending: Emergency Medicine | Admitting: Emergency Medicine

## 2021-06-19 DIAGNOSIS — F32A Depression, unspecified: Secondary | ICD-10-CM

## 2021-06-19 DIAGNOSIS — M791 Myalgia, unspecified site: Secondary | ICD-10-CM | POA: Insufficient documentation

## 2021-06-19 DIAGNOSIS — F1494 Cocaine use, unspecified with cocaine-induced mood disorder: Secondary | ICD-10-CM

## 2021-06-19 DIAGNOSIS — F333 Major depressive disorder, recurrent, severe with psychotic symptoms: Secondary | ICD-10-CM | POA: Diagnosis not present

## 2021-06-19 DIAGNOSIS — J449 Chronic obstructive pulmonary disease, unspecified: Secondary | ICD-10-CM | POA: Diagnosis not present

## 2021-06-19 DIAGNOSIS — F332 Major depressive disorder, recurrent severe without psychotic features: Secondary | ICD-10-CM | POA: Diagnosis present

## 2021-06-19 DIAGNOSIS — Z7901 Long term (current) use of anticoagulants: Secondary | ICD-10-CM | POA: Insufficient documentation

## 2021-06-19 DIAGNOSIS — F411 Generalized anxiety disorder: Secondary | ICD-10-CM | POA: Insufficient documentation

## 2021-06-19 DIAGNOSIS — Z7951 Long term (current) use of inhaled steroids: Secondary | ICD-10-CM | POA: Insufficient documentation

## 2021-06-19 DIAGNOSIS — R45851 Suicidal ideations: Secondary | ICD-10-CM

## 2021-06-19 DIAGNOSIS — Z79899 Other long term (current) drug therapy: Secondary | ICD-10-CM | POA: Diagnosis not present

## 2021-06-19 DIAGNOSIS — Z5900 Homelessness unspecified: Secondary | ICD-10-CM | POA: Diagnosis not present

## 2021-06-19 DIAGNOSIS — Z86718 Personal history of other venous thrombosis and embolism: Secondary | ICD-10-CM | POA: Diagnosis not present

## 2021-06-19 DIAGNOSIS — I10 Essential (primary) hypertension: Secondary | ICD-10-CM | POA: Diagnosis not present

## 2021-06-19 DIAGNOSIS — F1420 Cocaine dependence, uncomplicated: Secondary | ICD-10-CM | POA: Insufficient documentation

## 2021-06-19 DIAGNOSIS — R0602 Shortness of breath: Secondary | ICD-10-CM | POA: Diagnosis not present

## 2021-06-19 DIAGNOSIS — J45909 Unspecified asthma, uncomplicated: Secondary | ICD-10-CM | POA: Insufficient documentation

## 2021-06-19 DIAGNOSIS — I2693 Single subsegmental pulmonary embolism without acute cor pulmonale: Secondary | ICD-10-CM

## 2021-06-19 LAB — COMPREHENSIVE METABOLIC PANEL
ALT: 23 U/L (ref 0–44)
AST: 24 U/L (ref 15–41)
Albumin: 4 g/dL (ref 3.5–5.0)
Alkaline Phosphatase: 80 U/L (ref 38–126)
Anion gap: 11 (ref 5–15)
BUN: 6 mg/dL (ref 6–20)
CO2: 22 mmol/L (ref 22–32)
Calcium: 9.3 mg/dL (ref 8.9–10.3)
Chloride: 102 mmol/L (ref 98–111)
Creatinine, Ser: 0.71 mg/dL (ref 0.44–1.00)
GFR, Estimated: >60 mL/min (ref >60)
Glucose, Bld: 75 mg/dL (ref 70–99)
Potassium: 3.7 mmol/L (ref 3.5–5.1)
Sodium: 135 mmol/L (ref 135–145)
Total Bilirubin: 0.2 mg/dL — ABNORMAL LOW (ref 0.3–1.2)
Total Protein: 7.8 g/dL (ref 6.5–8.1)

## 2021-06-19 LAB — CBC WITH DIFFERENTIAL/PLATELET
Abs Immature Granulocytes: 0.02 10*3/uL (ref 0.00–0.07)
Basophils Absolute: 0.1 10*3/uL (ref 0.0–0.1)
Basophils Relative: 1 %
Eosinophils Absolute: 0.1 10*3/uL (ref 0.0–0.5)
Eosinophils Relative: 2 %
HCT: 39.8 % (ref 36.0–46.0)
Hemoglobin: 13 g/dL (ref 12.0–15.0)
Immature Granulocytes: 0 %
Lymphocytes Relative: 27 %
Lymphs Abs: 1.8 10*3/uL (ref 0.7–4.0)
MCH: 27.6 pg (ref 26.0–34.0)
MCHC: 32.7 g/dL (ref 30.0–36.0)
MCV: 84.5 fL (ref 80.0–100.0)
Monocytes Absolute: 0.5 10*3/uL (ref 0.1–1.0)
Monocytes Relative: 8 %
Neutro Abs: 4.2 10*3/uL (ref 1.7–7.7)
Neutrophils Relative %: 62 %
Platelets: 746 10*3/uL — ABNORMAL HIGH (ref 150–400)
RBC: 4.71 MIL/uL (ref 3.87–5.11)
RDW: 15.5 % (ref 11.5–15.5)
WBC: 6.7 10*3/uL (ref 4.0–10.5)
nRBC: 0 % (ref 0.0–0.2)

## 2021-06-19 LAB — ACETAMINOPHEN LEVEL: Acetaminophen (Tylenol), Serum: <10 ug/mL — ABNORMAL LOW (ref 10–30)

## 2021-06-19 LAB — SALICYLATE LEVEL: Salicylate Lvl: <7 mg/dL — ABNORMAL LOW (ref 7.0–30.0)

## 2021-06-19 LAB — RAPID URINE DRUG SCREEN, HOSP PERFORMED
Amphetamines: NOT DETECTED
Barbiturates: NOT DETECTED
Benzodiazepines: POSITIVE — AB
Cocaine: POSITIVE — AB
Opiates: NOT DETECTED
Tetrahydrocannabinol: NOT DETECTED

## 2021-06-19 LAB — URINALYSIS, ROUTINE W REFLEX MICROSCOPIC
Bilirubin Urine: NEGATIVE
Glucose, UA: NEGATIVE mg/dL
Hgb urine dipstick: NEGATIVE
Ketones, ur: 20 mg/dL — AB
Leukocytes,Ua: NEGATIVE
Nitrite: NEGATIVE
Protein, ur: NEGATIVE mg/dL
Specific Gravity, Urine: 1.014 (ref 1.005–1.030)
pH: 5 (ref 5.0–8.0)

## 2021-06-19 LAB — CK: Total CK: 206 U/L (ref 38–234)

## 2021-06-19 LAB — ETHANOL: Alcohol, Ethyl (B): <10 mg/dL (ref <10)

## 2021-06-19 MED ORDER — APIXABAN 5 MG PO TABS
5.0000 mg | ORAL_TABLET | Freq: Two times a day (BID) | ORAL | Status: DC
  Administered 2021-06-19 – 2021-06-20 (×3): 5 mg via ORAL
  Filled 2021-06-19 (×3): qty 1

## 2021-06-19 MED ORDER — PREDNISONE 50 MG PO TABS
60.0000 mg | ORAL_TABLET | Freq: Once | ORAL | Status: AC
  Administered 2021-06-19: 60 mg via ORAL
  Filled 2021-06-19: qty 1

## 2021-06-19 MED ORDER — PREDNISONE 20 MG PO TABS: 40.0000 mg | ORAL_TABLET | Freq: Every day | ORAL | 0 refills | Status: AC

## 2021-06-19 MED ORDER — OXYCODONE HCL 5 MG PO TABS
5.0000 mg | ORAL_TABLET | Freq: Once | ORAL | Status: AC
  Administered 2021-06-19: 5 mg via ORAL
  Filled 2021-06-19: qty 1

## 2021-06-19 MED ORDER — ALBUTEROL SULFATE HFA 108 (90 BASE) MCG/ACT IN AERS
4.0000 | INHALATION_SPRAY | Freq: Once | RESPIRATORY_TRACT | Status: AC
  Administered 2021-06-19: 4 via RESPIRATORY_TRACT
  Filled 2021-06-19: qty 6.7

## 2021-06-19 NOTE — ED Triage Notes (Signed)
Patient states she is depressed and smoked cocaine and is now having suicidal ideation without plan. Patient states bilateral feet and knee pain.  ?

## 2021-06-19 NOTE — ED Provider Notes (Addendum)
[facility] EMERGENCY DEPARTMENT Provider Note   CSN: [id number] Arrival date & time: 06/19/21  0845     History  Chief Complaint  Patient presents with   Psychiatric Evaluation    Kathryn [name] is a 51 y.o. female with a past medical history significant for hypertension, history of DVT/PE on Eliquis, COPD, chronic back pain, and bipolar disorder who presents to the ED due to suicidal ideations.  Patient admits to being severely depressed and not wanting to live.  She notes she is homeless and has nowhere to live.  Admits to visual and auditory hallucinations.  Denies HI.  Patient denies any suicide plan.  Patient notes since she has been homeless she has not been taking any medications for the past week.  She endorses some shortness of breath which she attributes to her asthma/COPD.  She also endorses myalgias throughout her whole body most specifically her feet.  Patient notes she has been walking around a lot and her feet have gotten cold since being outside. No fever or chills. Denies chest pain. Denies nausea and vomiting. No injury to bilateral legs. She admits to using cocaine last week. Drinks alcohol occasionally. Denies other drug use.      Home Medications Prior to Admission medications   Medication Sig Start Date End Date Taking? Authorizing Provider  albuterol (PROVENTIL) (2.5 MG/3ML) 0.083% nebulizer solution Take 2.5 mg by nebulization every 6 (six) hours as needed for wheezing or shortness of breath.    [provider]  amLODipine (NORVASC) 5 MG tablet Take 1 tablet (5 mg total) by mouth daily. for blood pressure 03/24/21   Lindell Spar, MD  apixaban (ELIQUIS) 5 MG TABS tablet Take 1 tablet (5 mg total) by mouth 2 (two) times daily. 02/22/21   Arrien, Jimmy Picket, MD  Cholecalciferol (VITAMIN D) 50 MCG (2000 UT) CAPS Take 1 capsule by mouth daily.    [provider]  citalopram (CELEXA) 20 MG tablet Take 20 mg by mouth daily. 05/16/21   [provider]  cloNIDine (CATAPRES) 0.1 MG tablet Take 1 tablet (0.1 mg total) by mouth daily. 03/24/21   Lindell Spar, MD  Cyanocobalamin (VITAMIN B 12 PO) Take 1 tablet by mouth daily.    [provider]  diazepam (VALIUM) 5 MG tablet Take 1 tablet (5 mg total) by mouth at bedtime as needed for anxiety. 04/26/21   Lindell Spar, MD  DULoxetine (CYMBALTA) 30 MG capsule Take 1 capsule (30 mg total) by mouth 2 (two) times daily. 03/24/21   Lindell Spar, MD  fluticasone-salmeterol [facility] INHUB) 250-50 MCG/ACT AEPB Inhale 1 puff into the lungs in the morning and at bedtime. 05/17/21   Hunsucker, Bonna Gains, MD  furosemide (LASIX) 20 MG tablet Take 1 tablet (20 mg total) by mouth daily. 03/31/21 05/23/21  Lindell Spar, MD  hydrocortisone (ANUSOL-HC) 2.5 % rectal cream Place 1 application rectally 2 (two) times daily. 11/18/20   Lindell Spar, MD  hydrOXYzine (VISTARIL) 50 MG capsule Take 1 capsule (50 mg total) by mouth daily as needed for anxiety (Insomnia). Patient taking differently: Take 50 mg by mouth daily. 03/24/21   Lindell Spar, MD  methocarbamol (ROBAXIN) 500 MG tablet Take 1 tablet (500 mg total) by mouth 2 (two) times daily. 03/22/21   Mickie Hillier, PA-C  Misc. Devices MISC Rolling walker - ICD10: M48.061, Z98.1, M17.9 03/24/21   Lindell Spar, MD  omeprazole (PRILOSEC) 20 MG capsule Take 1 capsule (  20 mg total) by mouth daily. 03/24/21   Lindell Spar, MD  ondansetron (ZOFRAN-ODT) 4 MG disintegrating tablet Take 1 tablet (4 mg total) by mouth every 8 (eight) hours as needed for up to 10 doses for nausea or vomiting. 06/10/21   Trifan, Carola Rhine, MD  oxyCODONE (ROXICODONE) 5 MG immediate release tablet Take 1 tablet (5 mg total) by mouth every 8 (eight) hours as needed for up to 10 doses for severe pain. 06/10/21   Wyvonnia Dusky, MD  potassium chloride SA (KLOR-CON M) 20 MEQ tablet TAKE (1) TABLET BY MOUTH ONCE A DAY. 05/18/21   Lindell Spar, MD  pregabalin (LYRICA)  100 MG capsule Take 1 capsule (100 mg total) by mouth 2 (two) times daily. 04/26/21   Lindell Spar, MD  risperiDONE (RISPERDAL) 0.5 MG tablet Take 0.5 mg by mouth 2 (two) times daily. 05/16/21   [provider]  sucralfate (CARAFATE) 1 g tablet Take 1 tablet (1 g total) by mouth 4 (four) times daily -  with meals and at bedtime. 04/05/21   Varney Biles, MD  traZODone (DESYREL) 100 MG tablet Take 100 mg by mouth at bedtime. 05/16/21   [provider]      Allergies    Clonopin [clonazepam], Fish allergy, Flexeril [cyclobenzaprine hcl], Ibuprofen, Shellfish allergy, Tylenol [acetaminophen], Ace inhibitors, Cyclobenzaprine, Tramadol, and Trazodone and nefazodone    Review of Systems   Review of Systems  Constitutional:  Negative for chills and fever.  Respiratory:  Positive for cough and shortness of breath.   Cardiovascular:  Negative for chest pain and leg swelling.  Musculoskeletal:  Positive for myalgias.  Psychiatric/Behavioral:  Positive for hallucinations and suicidal ideas.    Physical Exam Updated Vital Signs BP (!) 125/102    Pulse (!) 104    Temp 98.1 F (36.7 C) (Oral)    Resp 20    Ht '4\' 11"'$  (1.499 m)    Wt 85.7 kg    LMP 06/07/2020    SpO2 100%    BMI 38.17 kg/m  Physical Exam Vitals and nursing note reviewed.  Constitutional:      General: She is not in acute distress.    Appearance: She is not ill-appearing.  HENT:     Head: Normocephalic.  Eyes:     Pupils: Pupils are equal, round, and reactive to light.  Cardiovascular:     Rate and Rhythm: Normal rate and regular rhythm.     Pulses: Normal pulses.     Heart sounds: Normal heart sounds. No murmur heard.   No friction rub. No gallop.  Pulmonary:     Effort: Pulmonary effort is normal.     Breath sounds: Wheezing present.  Abdominal:     General: Abdomen is flat. There is no distension.     Palpations: Abdomen is soft.     Tenderness: There is no abdominal tenderness. There is no guarding or  rebound.  Musculoskeletal:        General: Normal range of motion.     Cervical back: Neck supple.     Comments: Tenderness throughout body with no localization of pain. No erythema, edema, or warmth.   Skin:    General: Skin is warm and dry.  Neurological:     General: No focal deficit present.     Mental Status: She is alert.  Psychiatric:        Mood and Affect: Mood normal.        Behavior: Behavior normal.  ED Results / Procedures / Treatments   Labs (all labs ordered are listed, but only abnormal results are displayed) Labs Reviewed  COMPREHENSIVE METABOLIC PANEL - Abnormal; Notable for the following components:      Result Value   Total Bilirubin 0.2 (*)    All other components within normal limits  RAPID URINE DRUG SCREEN, HOSP PERFORMED - Abnormal; Notable for the following components:   Cocaine POSITIVE (*)    Benzodiazepines POSITIVE (*)    All other components within normal limits  CBC WITH DIFFERENTIAL/PLATELET - Abnormal; Notable for the following components:   Platelets 746 (*)    All other components within normal limits  URINALYSIS, ROUTINE W REFLEX MICROSCOPIC - Abnormal; Notable for the following components:   Ketones, ur 20 (*)    All other components within normal limits  ACETAMINOPHEN LEVEL - Abnormal; Notable for the following components:   Acetaminophen (Tylenol), Serum <10 (*)    All other components within normal limits  SALICYLATE LEVEL - Abnormal; Notable for the following components:   Salicylate Lvl <4.0 (*)    All other components within normal limits  RESP PANEL BY RT-PCR (FLU A&B, COVID) [id number]  ETHANOL  CK    EKG EKG Interpretation  Date/Time:  Sunday June 19 2021 09:36:31 EST Ventricular Rate:  85 PR Interval:  142 QRS Duration: 84 QT Interval:  404 QTC Calculation: 480 R Axis:   105 Text Interpretation: Normal sinus rhythm Rightward axis Prolonged QT Abnormal ECG When compared with ECG of 23-May-2021 08:14, PREVIOUS ECG IS  PRESENT since last tracing no significant change Confirmed by Noemi Chapel [phone]) on 06/19/2021 9:48:56 AM  Radiology DG Chest 1 View  Result Date: 06/19/2021 CLINICAL DATA:  Chest pain and sob. Pt admitted smoking crack. EXAM: CHEST  1 VIEW COMPARISON:  05/23/2021. FINDINGS: Normal heart, mediastinum and hila. Clear lungs.  No pleural effusion or pneumothorax. Skeletal structures are grossly intact. IMPRESSION: No active disease. Electronically Signed   By: Lajean Manes M.D.   On: 06/19/2021 10:11    Procedures Procedures    Medications Ordered in ED Medications  apixaban (ELIQUIS) tablet 5 mg (has no administration in time range)  albuterol (VENTOLIN HFA) 108 (90 Base) MCG/ACT inhaler 4 puff (4 puffs Inhalation Given 06/19/21 0942)  predniSONE (DELTASONE) tablet 60 mg (60 mg Oral Given 06/19/21 0942)  oxyCODONE (Oxy IR/ROXICODONE) immediate release tablet 5 mg (5 mg Oral Given 06/19/21 1047)    ED Course/ Medical Decision Making/ A&P Clinical Course as of 06/19/21 1132  Sun Jun 19, 2021  1005 COCAINE(!): POSITIVE [CA]  1021 Benzodiazepines(!): POSITIVE [CA]    Clinical Course User Index [CA] Suzy Bouchard, PA-C                           Medical Decision Making Amount and/or Complexity of Data Reviewed Labs: ordered. Decision-making details documented in ED Course. Radiology: ordered and independent interpretation performed. Decision-making details documented in ED Course. ECG/medicine tests: ordered and independent interpretation performed. Decision-making details documented in ED Course.  Risk Prescription drug management.   51 year old female presents to the ED due to suicidal ideations.  Denies any active plans.  Admits to auditory/visual hallucinations.  History of bipolar disorder.  Notes she has not taken any of her medications in roughly 1 week due to homelessness.  Patient also endorses myalgias throughout her body which she attributes to frequent walking and being  cold outside.  She also admits to  some shortness of breath.  History of asthma/COPD.  Denies chest pain.  Upon arrival, stable vitals.  Patient in no acute distress.  Physical exam significant for expiratory wheeze heard throughout.  Tenderness throughout her entire body without any localization of pain.  Medical clearance labs ordered.  Chest x-ray to rule out signs of pneumonia.  COVID/influenza test rule out infection.  Low suspicion for PE/DVT given no chest pain or hypoxia.  Patient seen in the ED on 2/6 where ultrasounds were obtained which were negative for DVT.  At that time, patient also complained of myalgias.  Lab work at that time was reassuring. Pain medication given. Albuterol and prednisone for wheeze. Regular home dose of Eliquis restarted.   CBC significant for thrombocytosis at 746.  Thrombocytosis has been present in the past.  CMP reassuring.  Normal renal function.  No major electrolyte derangements.  CK normal.  Doubt rhabdomyolysis.  Ethanol level within normal limits.  Chest x-ray personally reviewed and interpreted which is negative for signs of pneumonia, pneumothorax, or widened mediastinum.  EKG demonstrates normal sinus rhythm with no signs of acute ischemia.  Acetaminophen and salicylate level within normal limits.  Patient has been medically cleared for TTS evaluation.  The patient has been placed in psychiatric observation due to the need to provide a safe environment for the patient while obtaining psychiatric consultation and evaluation, as well as ongoing medical and medication management to treat the patient's condition.  The patient has not been placed under full IVC at this time.  Ricky Ala NP, recommends overnight observation and to be reassessed by psychiatric at [facility]. Patient transferred to [facility] in stable condition.   Addendum: unfortunately, unable to transfer patient to [facility] per RN due to guidelines. Will keep overnight here in the ED and have patient reassessed  in the AM.   Final Clinical Impression(s) / ED Diagnoses Final diagnoses:  Depression, unspecified depression type  Suicidal ideation    Rx / DC Orders ED Discharge Orders     None         Suzy Bouchard, PA-C 06/19/21 1530    Suzy Bouchard, PA-C 06/19/21 1543    Noemi Chapel, MD 06/20/21 2025
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
## 2021-06-19 NOTE — ED Notes (Signed)
Patient wanded by security and personal belongings locked up in Ed storage room on shelf. ?

## 2021-06-19 NOTE — ED Notes (Signed)
Took over patient care as sitter at this time ?Pt in bed coughing/hacking quite a bit. Pt has emesis bag to spit into ?Day shift RN states this just started ?

## 2021-06-19 NOTE — ED Provider Notes (Signed)
Spoke to registration about reviewing patient's home medication list to order home medications; however no one is available to do this until tomorrow morning. Patient already given Eliquis. Will hold off on further medications until tomorrow AM when her medication list can be reviewed.  ?  ?Kathryn [name] ?06/19/21 1824 ? ?  ?Godfrey Pick, MD ?06/24/21 1130 ? ?

## 2021-06-19 NOTE — ED Notes (Signed)
Per Ricky Ala, NP [facility] patient needs to be observed overnight and reassessed in the morning.  ?

## 2021-06-20 DIAGNOSIS — F332 Major depressive disorder, recurrent severe without psychotic features: Secondary | ICD-10-CM

## 2021-06-20 DIAGNOSIS — I2693 Single subsegmental pulmonary embolism without acute cor pulmonale: Secondary | ICD-10-CM | POA: Insufficient documentation

## 2021-06-20 HISTORY — DX: Single subsegmental thrombotic pulmonary embolism without acute cor pulmonale: I26.93

## 2021-06-20 MED ORDER — OXYCODONE-ACETAMINOPHEN 5-325 MG PO TABS
1.0000 | ORAL_TABLET | Freq: Once | ORAL | Status: AC
  Administered 2021-06-20: 1 via ORAL
  Filled 2021-06-20: qty 1

## 2021-06-20 NOTE — Consult Note (Signed)
Telepsych Consultation   Reason for Consult:  MDD (major depressive disorder) recurrent episode, severe [facility]) Referring Physician:  Dr. Roderic Palau Location of [facility]: [facility] Location of Provider: [place] [facility] Patient Identification: Kathryn [name] MRN:  [id number] Principal Diagnosis: MDD (major depressive disorder), recurrent episode, severe ([place]) Diagnosis:  Principal Problem:   MDD (major depressive disorder), recurrent episode, severe ([place])  Total Time spent with patient: 30 minutes  Subjective: "I do not have a place to live or go to when I leave this hospital. I was leaving with my daughter and her boyfriend but they kicked me out. I spent last night with my friend. I'm so depressed because of all these and I'm having suicidal thoughts."  HPI:  Kathryn [name] is a 51 y.o. homeless female patient admitted to [facility] on 06/19/21 with recurrent suicidal ideation without a specific plan, substance use, depressive episode and anxiety due to homelessness problem. Patient had recent multiple [facility] visits on 05/23/21 for legs and back pain, 06/10/21 for edema to legs, and 06/19/21 for suicidal ideation.    Patient reported that she lived with her daughter and her boyfriend and recently her daughter preferred her boyfriend better than her mother and both decided to kick her out of their home. She went to spend the night with her friend and became so depressed with suicidal ideation without any plan. Patient reported that, if she could find a place to stay, that all her depressive and suicidal ideation or thought would be minimized because she would start going to church. She endorsed having several comorbidity as neuropathy, hypertension, GERD, depression, COPD, anxiety, prior intentional self poisoning and cannot do much for her self.   On assessment today, patient was examined lying in bed. She is alert, awake and oriented to person, place, time and situation. Her  speech is fluent, clear with normal pattern and volume. Her affect is congruent and mood depressed due to her homelessness. Endorsed 4 to 5 hours of sleep last night and good appetite. Denies SI, but added, "because I am in the hospital." Denies homicidal ideation. With AVH, stated, "I see people walking up and down the walls and hallways whispering."  Denied access to firearms, and recent self injury. Endorsed to drinking alcohol, whenever she smokes crack cocaine. Her last crack cocaine intake was Thursday, 06/17/21. Admitted to managing and taking her medications as ordered. She also admitted to multiple OPT help in the past. From my assessment patient does not meet the criteria for in-patient admission. A consult was put in for a Transfer of Care to the [place] Worker to help patient with resources for her homelessness. [facility] staff made aware of patient disposition and being Psychiatrically cleared.  Past Psychiatric History: MDD (major depressive disorder) recurrent episode, severe  Risk to Self:   Risk to Others:   Prior Inpatient Therapy:   Prior Outpatient Therapy:    Past Medical History:  Past Medical History:  Diagnosis Date   Anxiety    Arthritis    Asthma    Bipolar disorder ([place])    Chronic back pain    COPD (chronic obstructive pulmonary disease) (HCC)    Chronic bronchitis   Depression    GERD (gastroesophageal reflux disease)    Hypertension    Neuropathy    Osteoarthritis of left knee, 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   Sleep apnea 04/2020   GETTING A cpap   Suicidal ideation 12/24/2018    Past Surgical History:  Procedure Laterality Date  BACK SURGERY     DILATATION AND CURETTAGE/HYSTEROSCOPY WITH MINERVA N/A 06/09/2020   Procedure: DILATATION AND CURETTAGE/HYSTEROSCOPY WITH MINERVA;  Surgeon: Florian Buff, MD;  Location: [facility];  Service: Gynecology;  Laterality: N/A;   ESOPHAGOGASTRODUODENOSCOPY (EGD) WITH PROPOFOL N/A 12/25/2018   Procedure:  ESOPHAGOGASTRODUODENOSCOPY (EGD) WITH PROPOFOL;  Surgeon: Rogene Houston, MD;  Location: [facility] SU[facility];  Service: Endoscopy;  Laterality: N/A;   PATELLA-FEMORAL ARTHROPLASTY Left 10/08/2018   Procedure: PATELLA-FEMORAL ARTHROPLASTY;  Surgeon: Marchia Bond, MD;  Location: [facility];  Service: Orthopedics;  Laterality: Left;   TUBAL LIGATION     Family History:  Family History  Problem Relation Age of Onset   Gout Paternal Grandfather    Cirrhosis Paternal Grandfather    Hypertension Paternal Grandmother    Aneurysm Paternal Grandmother    Cirrhosis Maternal Grandmother    Cirrhosis Maternal Grandfather    Cancer Father    Cirrhosis Father    Cirrhosis Mother    Breast cancer Sister    Hypertension Sister    Bronchitis Daughter    Bronchitis Daughter    Asthma Son    Bronchitis Son    Migraines Neg Hx    Family Psychiatric  History: Patient denied Social History:  Social History   Substance and Sexual Activity  Alcohol Use Yes   Comment: occasionally     Social History   Substance and Sexual Activity  Drug Use Not Currently   Types: Cocaine   Comment: crack  last used 2016    Social History   Socioeconomic History   Marital status: Single    Spouse name: Not on file   Number of children: Not on file   Years of education: Not on file   Highest education level: Not on file  Occupational History   Not on file  Tobacco Use   Smoking status: Some Days    Types: Cigarettes   Smokeless tobacco: Never   Tobacco comments:    Occasional smoker  Vaping Use   Vaping Use: Never used  Substance and Sexual Activity   Alcohol use: Yes    Comment: occasionally   Drug use: Not Currently    Types: Cocaine    Comment: crack  last used 2016   Sexual activity: Yes    Birth control/protection: Surgical    Comment: tubal, ablation  Other Topics Concern   Not on file  Social History Narrative   R handed    Lives with boyfriend   1 Cup of caffeine daily    Social  Determinants of Health   Financial Resource Strain: Not on file  Food Insecurity: No Food Insecurity   Worried About Charity fundraiser in the Last Year: Never true   Ran Out of Food in the Last Year: Never true  Transportation Needs: No Transportation Needs   Lack of Transportation (Medical): No   Lack of Transportation (Non-Medical): No  Physical Activity: Not on file  Stress: Not on file  Social Connections: Not on file   Additional Social History:   Allergies:  See list below Allergies  Allergen Reactions   Clonopin [Clonazepam] Anaphylaxis    Swelling of the tongue and mouth.    Fish Allergy Anaphylaxis, Shortness Of Breath and Swelling   Flexeril [Cyclobenzaprine Hcl] Shortness Of Breath   Ibuprofen Anaphylaxis, Hives and Other (See Comments)   Shellfish Allergy Anaphylaxis   Tylenol [Acetaminophen] Anaphylaxis   Ace Inhibitors Cough   Cyclobenzaprine    Tramadol Nausea And Vomiting  Upset stomach   Trazodone And Nefazodone Hives    Labs:  Results for orders placed or performed during the hospital encounter of 06/19/21 (from the past 48 hour(s))  Urine rapid drug screen (hosp performed)     Status: Abnormal   Collection Time: 06/19/21  9:22 AM  Result Value Ref Range   Opiates NONE DETECTED NONE DETECTED   Cocaine POSITIVE (A) NONE DETECTED   Benzodiazepines POSITIVE (A) NONE DETECTED   Amphetamines NONE DETECTED NONE DETECTED   Tetrahydrocannabinol NONE DETECTED NONE DETECTED   Barbiturates NONE DETECTED NONE DETECTED    Comment: (NOTE) DRUG SCREEN FOR MEDICAL PURPOSES ONLY.  IF CONFIRMATION IS NEEDED FOR ANY PURPOSE, NOTIFY LAB WITHIN 5 DAYS.  LOWEST DETECTABLE LIMITS FOR URINE DRUG SCREEN Drug Class                     Cutoff (ng/mL) Amphetamine and metabolites    1000 Barbiturate and metabolites    200 Benzodiazepine                 485 Tricyclics and metabolites     300 Opiates and metabolites        300 Cocaine and metabolites        300 THC                             50 Performed at [address] [place], [place] [address]   Urinalysis, Routine w reflex microscopic     Status: Abnormal   Collection Time: 06/19/21  9:22 AM  Result Value Ref Range   Color, Urine YELLOW YELLOW   APPearance CLEAR CLEAR   Specific Gravity, Urine 1.014 1.005 - 1.030   pH 5.0 5.0 - 8.0   Glucose, UA NEGATIVE NEGATIVE mg/dL   Hgb urine dipstick NEGATIVE NEGATIVE   Bilirubin Urine NEGATIVE NEGATIVE   Ketones, ur 20 (A) NEGATIVE mg/dL   Protein, ur NEGATIVE NEGATIVE mg/dL   Nitrite NEGATIVE NEGATIVE   Leukocytes,Ua NEGATIVE NEGATIVE    Comment: Performed at [facility], [address] [place], [place] [address]  Comprehensive metabolic panel     Status: Abnormal   Collection Time: 06/19/21  9:36 AM  Result Value Ref Range   Sodium 135 135 - 145 mmol/L   Potassium 3.7 3.5 - 5.1 mmol/L   Chloride 102 98 - 111 mmol/L   CO2 22 22 - 32 mmol/L   Glucose, Bld 75 70 - 99 mg/dL    Comment: Glucose reference range applies only to samples taken after fasting for at least 8 hours.   BUN 6 6 - 20 mg/dL   Creatinine, Ser 0.71 0.44 - 1.00 mg/dL   Calcium 9.3 8.9 - 10.3 mg/dL   Total Protein 7.8 6.5 - 8.1 g/dL   Albumin 4.0 3.5 - 5.0 g/dL   AST 24 15 - 41 U/L   ALT 23 0 - 44 U/L   Alkaline Phosphatase 80 38 - 126 U/L   Total Bilirubin 0.2 (L) 0.3 - 1.2 mg/dL   GFR, Estimated >60 >60 mL/min    Comment: (NOTE) Calculated using the CKD-EPI Creatinine Equation (2021)    Anion gap 11 5 - 15    Comment: Performed at [facility], [address] [place], [place] [address]  Ethanol     Status: None   Collection Time: 06/19/21  9:36 AM  Result Value Ref Range   Alcohol, Ethyl (B) <10 <  10 mg/dL    Comment: (NOTE) Lowest detectable limit for serum alcohol is 10 mg/dL.  For medical purposes only. Performed at [facility], [address] [place], [place] [address]   CBC with Diff     Status: Abnormal   Collection Time: 06/19/21  9:36 AM   Result Value Ref Range   WBC 6.7 4.0 - 10.5 K/uL   RBC 4.71 3.87 - 5.11 MIL/uL   Hemoglobin 13.0 12.0 - 15.0 g/dL   HCT 39.8 36.0 - 46.0 %   MCV 84.5 80.0 - 100.0 fL   MCH 27.6 26.0 - 34.0 pg   MCHC 32.7 30.0 - 36.0 g/dL   RDW 15.5 11.5 - 15.5 %   Platelets 746 (H) 150 - 400 K/uL   nRBC 0.0 0.0 - 0.2 %   Neutrophils Relative % 62 %   Neutro Abs 4.2 1.7 - 7.7 K/uL   Lymphocytes Relative 27 %   Lymphs Abs 1.8 0.7 - 4.0 K/uL   Monocytes Relative 8 %   Monocytes Absolute 0.5 0.1 - 1.0 K/uL   Eosinophils Relative 2 %   Eosinophils Absolute 0.1 0.0 - 0.5 K/uL   Basophils Relative 1 %   Basophils Absolute 0.1 0.0 - 0.1 K/uL   Immature Granulocytes 0 %   Abs Immature Granulocytes 0.02 0.00 - 0.07 K/uL    Comment: Performed at [facility], [address] [place], [place] [address]  CK     Status: None   Collection Time: 06/19/21  9:41 AM  Result Value Ref Range   Total CK 206 38 - 234 U/L    Comment: Performed at [facility], [address], [place], [place] [address]  Acetaminophen level     Status: Abnormal   Collection Time: 06/19/21  9:41 AM  Result Value Ref Range   Acetaminophen (Tylenol), Serum <10 (L) 10 - 30 ug/mL    Comment: (NOTE) Therapeutic concentrations vary significantly. A range of 10-30 ug/mL  may be an effective concentration for many patients. However, some  are best treated at concentrations outside of this range. Acetaminophen concentrations >150 ug/mL at 4 hours after ingestion  and >50 ug/mL at 12 hours after ingestion are often associated with  toxic reactions.  Performed at [facility], [address] [place], [place] [address]   Salicylate level     Status: Abnormal   Collection Time: 06/19/21  9:41 AM  Result Value Ref Range   Salicylate Lvl <8.3 (L) 7.0 - 30.0 mg/dL    Comment: Performed at [facility], [address] [place], [place] [address]    Medications:  Current Facility-Administered Medications  Medication Dose Route Frequency  Provider Last Rate Last Admin   apixaban (ELIQUIS) tablet 5 mg  5 mg Oral BID Suzy Bouchard, PA-C   5 mg at 06/20/21 1607   Current Outpatient Medications  Medication Sig Dispense Refill   albuterol (PROVENTIL) (2.5 MG/3ML) 0.083% nebulizer solution Take 2.5 mg by nebulization every 6 (six) hours as needed for wheezing or shortness of breath.     amLODipine (NORVASC) 5 MG tablet Take 1 tablet (5 mg total) by mouth daily. for blood pressure 90 tablet 0   apixaban (ELIQUIS) 5 MG TABS tablet Take 1 tablet (5 mg total) by mouth 2 (two) times daily. 60 tablet 0   Cholecalciferol (VITAMIN D) 50 MCG (2000 UT) CAPS Take 1 capsule by mouth daily.     citalopram (CELEXA) 20 MG tablet Take 20 mg by mouth daily.  cloNIDine (CATAPRES) 0.1 MG tablet Take 1 tablet (0.1 mg total) by mouth daily. 90 tablet 0   Cyanocobalamin (VITAMIN B 12 PO) Take 1 tablet by mouth daily.     diazepam (VALIUM) 5 MG tablet Take 1 tablet (5 mg total) by mouth at bedtime as needed for anxiety. 30 tablet 0   fluticasone-salmeterol (WIXELA INHUB) 250-50 MCG/ACT AEPB Inhale 1 puff into the lungs in the morning and at bedtime. 180 each 2   furosemide (LASIX) 20 MG tablet Take 1 tablet (20 mg total) by mouth daily. 30 tablet 0   hydrOXYzine (VISTARIL) 50 MG capsule Take 1 capsule (50 mg total) by mouth daily as needed for anxiety (Insomnia). (Patient taking differently: Take 50 mg by mouth daily.) 30 capsule 5   omeprazole (PRILOSEC) 20 MG capsule Take 1 capsule (20 mg total) by mouth daily. 90 capsule 3   ondansetron (ZOFRAN-ODT) 4 MG disintegrating tablet Take 1 tablet (4 mg total) by mouth every 8 (eight) hours as needed for up to 10 doses for nausea or vomiting. 10 tablet 0   oxyCODONE (ROXICODONE) 5 MG immediate release tablet Take 1 tablet (5 mg total) by mouth every 8 (eight) hours as needed for up to 10 doses for severe pain. 10 tablet 0   potassium chloride SA (KLOR-CON M) 20 MEQ tablet TAKE (1) TABLET BY MOUTH ONCE A  DAY. (Patient taking differently: Take 20 mEq by mouth daily.) 30 tablet 0   predniSONE (DELTASONE) 20 MG tablet Take 2 tablets (40 mg total) by mouth daily for 5 days. 10 tablet 0   pregabalin (LYRICA) 100 MG capsule Take 1 capsule (100 mg total) by mouth 2 (two) times daily. 60 capsule 2   risperiDONE (RISPERDAL) 0.5 MG tablet Take 0.5 mg by mouth 2 (two) times daily.     sucralfate (CARAFATE) 1 g tablet Take 1 tablet (1 g total) by mouth 4 (four) times daily -  with meals and at bedtime. 90 tablet 0   traZODone (DESYREL) 100 MG tablet Take 100 mg by mouth at bedtime.     DULoxetine (CYMBALTA) 30 MG capsule Take 1 capsule (30 mg total) by mouth 2 (two) times daily. (Patient not taking: Reported on 06/20/2021) 60 capsule 5   hydrocortisone (ANUSOL-HC) 2.5 % rectal cream Place 1 application rectally 2 (two) times daily. (Patient not taking: Reported on 06/20/2021) 30 g 0   methocarbamol (ROBAXIN) 500 MG tablet Take 1 tablet (500 mg total) by mouth 2 (two) times daily. (Patient not taking: Reported on 06/20/2021) 20 tablet 0   Misc. Devices MISC Rolling walker - ICD10: M48.061, Z98.1, M17.9 1 each 0    Musculoskeletal: Strength & Muscle Tone: within normal limits Gait & Station: normal Patient leans: N/A  Psychiatric Specialty Exam:  Presentation  General Appearance: Appropriate for Environment; Casual; Fairly Groomed  Eye Contact:Fair  Speech:Clear and Coherent  Speech Volume:Normal  Handedness:Right  Mood and Affect  Mood:Anxious; Depressed; Hopeless  Affect:Depressed  Thought Process  Thought Processes:Linear  Descriptions of Associations:Intact  Orientation:Full (Time, Place and Person)  Thought Content:Rumination  History of Schizophrenia/Schizoaffective disorder:No  Duration of Psychotic Symptoms:N/A  Hallucinations:Hallucinations: Auditory; Visual Description of Auditory Hallucinations: Hearing whispers Description of Visual Hallucinations: Seeing people walk up and  down the walls and hallway  Ideas of Reference:None  Suicidal Thoughts:Suicidal Thoughts: Yes, Passive SI Passive Intent and/or Plan: Without Intent; Without Plan; Without Means to Carry Out; Without Access to Means  Homicidal Thoughts:Homicidal Thoughts: No  Sensorium  Memory:Immediate Fair; Recent  Fair; Remote Fair  Judgment:Fair  Insight:Fair  Executive Functions  Concentration:Fair  Attention Span:Fair  [place]  Language:Good  Psychomotor Activity  Psychomotor Activity:Psychomotor Activity: Normal  Assets  Assets:Communication Skills; Physical Health  Sleep  Sleep:Sleep: Fair Number of Hours of Sleep: 4  Physical Exam: Physical Exam Vitals and nursing note reviewed.  Constitutional:      Appearance: Normal appearance. She is obese.  HENT:     Head: Normocephalic and atraumatic.     Right Ear: External ear normal.     Left Ear: External ear normal.     Nose: Nose normal.     Mouth/Throat:     Mouth: Mucous membranes are moist.  Eyes:     Extraocular Movements: Extraocular movements intact.     Conjunctiva/sclera: Conjunctivae normal.  Cardiovascular:     Rate and Rhythm: Normal rate.     Pulses: Normal pulses.  Pulmonary:     Effort: Pulmonary effort is normal.  Abdominal:     Palpations: Abdomen is soft.  Genitourinary:    Comments: Deferred Musculoskeletal:        General: Normal range of motion.     Cervical back: Normal range of motion and neck supple.     Comments: Patient states soreness and tenderness to legs  Skin:    General: Skin is warm.  Neurological:     General: No focal deficit present.     Mental Status: She is alert and oriented to person, place, and time.  Psychiatric:        Behavior: Behavior normal.   Review of Systems  Constitutional: Negative.  Negative for chills and fever.  HENT: Negative.  Negative for hearing loss and tinnitus.   Eyes: Negative.  Negative for blurred vision and double  vision.  Respiratory: Negative.  Negative for cough, sputum production, shortness of breath and wheezing.   Cardiovascular: Negative.  Negative for chest pain and palpitations.  Gastrointestinal: Negative.  Negative for abdominal pain, constipation, diarrhea, heartburn, nausea and vomiting.  Genitourinary: Negative.   Musculoskeletal: Negative.   Skin: Negative.  Negative for itching and rash.  Neurological: Negative.  Negative for dizziness, seizures, loss of consciousness and headaches.  Endo/Heme/Allergies: Negative.         Clonopin [Clonazepam]  Anaphylaxis High            Noted: 06/26/2014   Fish Allergy  Anaphylaxis, High   Shortness Of Breath, Swelling Noted: 06/12/2013  Flexeril [Cyclobenzaprine Hcl] Shortness Of Breath High  Noted: 11/04/2010  Ibuprofen    Anaphylaxis  High  Noted: 11/04/2010  Shellfish Allergy Anaphylaxis High Noted 10/08/2018  Tylenol Anaphylaxis High Noted 06/09/2020  Cyclobenzaprine Severity Not specific  Noted: 03/29/2020  Tramadol Nausea And Vomiting Not Specified Noted: 06/12/2013  Trazodone And Nefazodone Upset stomach, hives Not Specific       Psychiatric/Behavioral:  Positive for depression (Stable with medication. Patient stated, "If I can find a place to stay, my depression will go away."), hallucinations (Stated, "I see people walking up and down the walls and hallway and whispering."), substance abuse (Stated, I do not spend money on crack cocaine, people only give it to me because of my situation.") and suicidal ideas (Patient stated "If I have my own place to stay, my suicide ideation will not happen because I will start going to church."). The patient is nervous/anxious ("I am on medication for anxiety and nervousness and they help.") and has insomnia ("I am on sleep medication).   Blood pressure  110/72, pulse 70, temperature 98.3 F (36.8 C), resp. rate 16, height '4\' 11"'$  (1.499 m), weight 85.7 kg, last menstrual period 06/07/2020, SpO2 96 %. Body  mass index is 38.17 kg/m.  Treatment Plan Summary: Daily contact with patient to assess and evaluate symptoms and progress in treatment and Medication management  Disposition: No evidence of imminent risk to self or others at present.   Patient does not meet criteria for psychiatric inpatient admission. Discussed crisis plan, support from social network, calling 911, coming to the Emergency Department, and calling Suicide Hotline.  This service was provided via telemedicine using a 2-way, interactive audio and video technology.  Names of all persons participating in this telemedicine service and their role in this encounter. Name: Kathryn [name] Role: Patient  Name: Garrison [place] Role: FNP  Name: Dr. Dwyane Dee Role: Medical Director  Name:  Role:     Laretta Bolster, [place] 06/20/2021 11:51 AM

## 2021-06-20 NOTE — ED Notes (Signed)
Pt breakfast at bedside ?

## 2021-06-20 NOTE — ED Notes (Signed)
TOC updated that pt was in need of homelessness resources. Pt was in shower when CSW presented to the ED. CSW provided pts RN with homelessness resource list, Good Rx card, and substance use resource list. Pts RN will provide resources to pt when she is ready for D/C. TOC signing off. ?

## 2021-06-20 NOTE — ED Notes (Signed)
Pt escorted to bathroom 

## 2021-06-20 NOTE — ED Notes (Signed)
Pt in shower at this time

## 2021-06-20 NOTE — Discharge Instructions (Addendum)
Follow-up as instructed by behavioral health.  Return as needed ?

## 2021-06-22 ENCOUNTER — Telehealth: Payer: Self-pay | Admitting: Physician Assistant

## 2021-06-22 ENCOUNTER — Ambulatory Visit: Payer: 59 | Admitting: Internal Medicine

## 2021-06-22 NOTE — Telephone Encounter (Signed)
Contacted patient regarding voicemail left about rescheduling new patient appointment. Left appointment scheduled, and left message stating that I will keep it scheduled until she calls back, left direct number for patient to call back.  ?

## 2021-06-27 ENCOUNTER — Ambulatory Visit (INDEPENDENT_AMBULATORY_CARE_PROVIDER_SITE_OTHER): Payer: Medicaid Other | Admitting: Gastroenterology

## 2021-06-29 ENCOUNTER — Inpatient Hospital Stay: Payer: 59

## 2021-06-29 ENCOUNTER — Inpatient Hospital Stay: Payer: 59 | Attending: Physician Assistant | Admitting: Physician Assistant

## 2021-06-29 DIAGNOSIS — Z7901 Long term (current) use of anticoagulants: Secondary | ICD-10-CM | POA: Insufficient documentation

## 2021-06-29 DIAGNOSIS — J45909 Unspecified asthma, uncomplicated: Secondary | ICD-10-CM | POA: Insufficient documentation

## 2021-06-29 DIAGNOSIS — Z86718 Personal history of other venous thrombosis and embolism: Secondary | ICD-10-CM | POA: Insufficient documentation

## 2021-06-29 DIAGNOSIS — J449 Chronic obstructive pulmonary disease, unspecified: Secondary | ICD-10-CM | POA: Insufficient documentation

## 2021-06-29 DIAGNOSIS — F419 Anxiety disorder, unspecified: Secondary | ICD-10-CM | POA: Insufficient documentation

## 2021-06-29 DIAGNOSIS — F329 Major depressive disorder, single episode, unspecified: Secondary | ICD-10-CM | POA: Insufficient documentation

## 2021-06-29 DIAGNOSIS — M170 Bilateral primary osteoarthritis of knee: Secondary | ICD-10-CM | POA: Insufficient documentation

## 2021-06-29 DIAGNOSIS — Z803 Family history of malignant neoplasm of breast: Secondary | ICD-10-CM | POA: Insufficient documentation

## 2021-06-29 DIAGNOSIS — D75839 Thrombocytosis, unspecified: Secondary | ICD-10-CM | POA: Insufficient documentation

## 2021-06-29 DIAGNOSIS — G4730 Sleep apnea, unspecified: Secondary | ICD-10-CM | POA: Insufficient documentation

## 2021-06-29 DIAGNOSIS — F17210 Nicotine dependence, cigarettes, uncomplicated: Secondary | ICD-10-CM | POA: Insufficient documentation

## 2021-06-29 DIAGNOSIS — I10 Essential (primary) hypertension: Secondary | ICD-10-CM | POA: Insufficient documentation

## 2021-06-29 DIAGNOSIS — K219 Gastro-esophageal reflux disease without esophagitis: Secondary | ICD-10-CM | POA: Insufficient documentation

## 2021-06-29 DIAGNOSIS — M129 Arthropathy, unspecified: Secondary | ICD-10-CM | POA: Insufficient documentation

## 2021-06-29 DIAGNOSIS — Z79899 Other long term (current) drug therapy: Secondary | ICD-10-CM | POA: Insufficient documentation

## 2021-07-02 ENCOUNTER — Other Ambulatory Visit: Payer: Self-pay

## 2021-07-02 ENCOUNTER — Emergency Department (HOSPITAL_COMMUNITY): Payer: 59

## 2021-07-02 ENCOUNTER — Encounter (HOSPITAL_COMMUNITY): Payer: Self-pay | Admitting: Emergency Medicine

## 2021-07-02 ENCOUNTER — Emergency Department (HOSPITAL_COMMUNITY)
Admission: EM | Admit: 2021-07-02 | Discharge: 2021-07-02 | Disposition: A | Discharge status: Home / Self Care | Payer: 59 | Attending: Emergency Medicine | Admitting: Emergency Medicine

## 2021-07-02 DIAGNOSIS — Z7901 Long term (current) use of anticoagulants: Secondary | ICD-10-CM | POA: Insufficient documentation

## 2021-07-02 DIAGNOSIS — Z7951 Long term (current) use of inhaled steroids: Secondary | ICD-10-CM | POA: Diagnosis not present

## 2021-07-02 DIAGNOSIS — J449 Chronic obstructive pulmonary disease, unspecified: Secondary | ICD-10-CM | POA: Insufficient documentation

## 2021-07-02 DIAGNOSIS — I10 Essential (primary) hypertension: Secondary | ICD-10-CM | POA: Diagnosis not present

## 2021-07-02 DIAGNOSIS — M79605 Pain in left leg: Secondary | ICD-10-CM | POA: Insufficient documentation

## 2021-07-02 DIAGNOSIS — Z79899 Other long term (current) drug therapy: Secondary | ICD-10-CM | POA: Diagnosis not present

## 2021-07-02 DIAGNOSIS — J45909 Unspecified asthma, uncomplicated: Secondary | ICD-10-CM | POA: Insufficient documentation

## 2021-07-02 DIAGNOSIS — M79604 Pain in right leg: Secondary | ICD-10-CM | POA: Insufficient documentation

## 2021-07-02 LAB — CBC WITH DIFFERENTIAL/PLATELET
Abs Immature Granulocytes: 0.01 10*3/uL (ref 0.00–0.07)
Basophils Absolute: 0 10*3/uL (ref 0.0–0.1)
Basophils Relative: 1 %
Eosinophils Absolute: 0.1 10*3/uL (ref 0.0–0.5)
Eosinophils Relative: 2 %
HCT: 38.4 % (ref 36.0–46.0)
Hemoglobin: 12.3 g/dL (ref 12.0–15.0)
Immature Granulocytes: 0 %
Lymphocytes Relative: 35 %
Lymphs Abs: 2.3 10*3/uL (ref 0.7–4.0)
MCH: 27.2 pg (ref 26.0–34.0)
MCHC: 32 g/dL (ref 30.0–36.0)
MCV: 85 fL (ref 80.0–100.0)
Monocytes Absolute: 0.5 10*3/uL (ref 0.1–1.0)
Monocytes Relative: 7 %
Neutro Abs: 3.7 10*3/uL (ref 1.7–7.7)
Neutrophils Relative %: 55 %
Platelets: 644 10*3/uL — ABNORMAL HIGH (ref 150–400)
RBC: 4.52 MIL/uL (ref 3.87–5.11)
RDW: 15.9 % — ABNORMAL HIGH (ref 11.5–15.5)
WBC: 6.7 10*3/uL (ref 4.0–10.5)
nRBC: 0 % (ref 0.0–0.2)

## 2021-07-02 LAB — COMPREHENSIVE METABOLIC PANEL
ALT: 33 U/L (ref 0–44)
AST: 35 U/L (ref 15–41)
Albumin: 3.6 g/dL (ref 3.5–5.0)
Alkaline Phosphatase: 82 U/L (ref 38–126)
Anion gap: 8 (ref 5–15)
BUN: 14 mg/dL (ref 6–20)
CO2: 25 mmol/L (ref 22–32)
Calcium: 9 mg/dL (ref 8.9–10.3)
Chloride: 107 mmol/L (ref 98–111)
Creatinine, Ser: 0.82 mg/dL (ref 0.44–1.00)
GFR, Estimated: >60 mL/min (ref >60)
Glucose, Bld: 93 mg/dL (ref 70–99)
Potassium: 3.6 mmol/L (ref 3.5–5.1)
Sodium: 140 mmol/L (ref 135–145)
Total Bilirubin: 0.2 mg/dL — ABNORMAL LOW (ref 0.3–1.2)
Total Protein: 7 g/dL (ref 6.5–8.1)

## 2021-07-02 LAB — CK: Total CK: 96 U/L (ref 38–234)

## 2021-07-02 LAB — MAGNESIUM: Magnesium: 1.9 mg/dL (ref 1.7–2.4)

## 2021-07-02 MED ORDER — LACTATED RINGERS IV BOLUS
1000.0000 mL | Freq: Once | INTRAVENOUS | Status: AC
  Administered 2021-07-02: 1000 mL via INTRAVENOUS

## 2021-07-02 MED ORDER — OXYCODONE HCL 5 MG PO TABS
5.0000 mg | ORAL_TABLET | Freq: Once | ORAL | Status: AC
  Administered 2021-07-02: 5 mg via ORAL
  Filled 2021-07-02: qty 1

## 2021-07-02 MED ORDER — METHOCARBAMOL 500 MG PO TABS
1000.0000 mg | ORAL_TABLET | Freq: Once | ORAL | Status: AC
  Administered 2021-07-02: 1000 mg via ORAL
  Filled 2021-07-02: qty 2

## 2021-07-02 NOTE — ED Provider Notes (Signed)
?[facility] ?Provider Note ? ? ?CSN: [id number] ?Arrival date & time: 07/02/21  0641 ? ?  ? ?History ? ?Chief Complaint  ?Patient presents with  ? Leg Pain  ? ? ?Kathryn [name] is a 51 y.o. female. ? ?HPI ?Patient presents for leg pain.  Medical history includes HTN, PE, DVT, asthma, GERD, arthritis, lumbar stenosis, depression, BLE edema, anxiety, COPD, bipolar disorder, OSA, and neuropathy.  She has a remote history of DVT/PE.  This occurred in 2019 following a back surgery.  She states that she was on Eliquis since that time up until 2 months ago.  She has not taken her Eliquis in the past 2 months due to difficulty obtaining her medications.  She does have arthritis and is currently planning on right knee surgery.  Over the last 2 weeks, she has had pain in her legs.  Currently she endorses pain in her right hamstring area and bilateral calf areas.  This pain has caused her difficulty with walking.  She has had 2 falls in the past 3 days.  She does not think that she injured anything during these falls. ?  ? ?Home Medications ?Prior to Admission medications   ?Medication Sig Start Date End Date Taking? Authorizing Provider  ?albuterol (PROVENTIL) (2.5 MG/3ML) 0.083% nebulizer solution Take 2.5 mg by nebulization every 6 (six) hours as needed for wheezing or shortness of breath.    [provider]  ?amLODipine (NORVASC) 5 MG tablet Take 1 tablet (5 mg total) by mouth daily. for blood pressure 03/24/21   Lindell Spar, MD  ?apixaban (ELIQUIS) 5 MG TABS tablet Take 1 tablet (5 mg total) by mouth 2 (two) times daily. 02/22/21   Arrien, Jimmy Picket, MD  ?Cholecalciferol (VITAMIN D) 50 MCG (2000 UT) CAPS Take 1 capsule by mouth daily.    [provider]  ?citalopram (CELEXA) 20 MG tablet Take 20 mg by mouth daily. 05/16/21   [provider]  ?cloNIDine (CATAPRES) 0.1 MG tablet Take 1 tablet (0.1 mg total) by mouth daily. 03/24/21   Lindell Spar, MD  ?Cyanocobalamin  (VITAMIN B 12 PO) Take 1 tablet by mouth daily.    [provider]  ?diazepam (VALIUM) 5 MG tablet Take 1 tablet (5 mg total) by mouth at bedtime as needed for anxiety. 04/26/21   Lindell Spar, MD  ?DULoxetine (CYMBALTA) 30 MG capsule Take 1 capsule (30 mg total) by mouth 2 (two) times daily. ?Patient not taking: Reported on 06/20/2021 03/24/21   Lindell Spar, MD  ?fluticasone-salmeterol [facility] INHUB) 250-50 MCG/ACT AEPB Inhale 1 puff into the lungs in the morning and at bedtime. 05/17/21   Hunsucker, Bonna Gains, MD  ?furosemide (LASIX) 20 MG tablet Take 1 tablet (20 mg total) by mouth daily. 03/31/21 06/20/21  Lindell Spar, MD  ?hydrocortisone (ANUSOL-HC) 2.5 % rectal cream Place 1 application rectally 2 (two) times daily. ?Patient not taking: Reported on 06/20/2021 11/18/20   Lindell Spar, MD  ?hydrOXYzine (VISTARIL) 50 MG capsule Take 1 capsule (50 mg total) by mouth daily as needed for anxiety (Insomnia). ?Patient taking differently: Take 50 mg by mouth daily. 03/24/21   Lindell Spar, MD  ?methocarbamol (ROBAXIN) 500 MG tablet Take 1 tablet (500 mg total) by mouth 2 (two) times daily. ?Patient not taking: Reported on 06/20/2021 03/22/21   Mickie Hillier, PA-C  ?Misc. Devices MISC Rolling walker - ICD10: M48.061, Z98.1, M17.9 03/24/21   Lindell Spar, MD  ?omeprazole ([place])  20 MG capsule Take 1 capsule (20 mg total) by mouth daily. 03/24/21   Lindell Spar, MD  ?ondansetron (ZOFRAN-ODT) 4 MG disintegrating tablet Take 1 tablet (4 mg total) by mouth every 8 (eight) hours as needed for up to 10 doses for nausea or vomiting. 06/10/21   Wyvonnia Dusky, MD  ?oxyCODONE (ROXICODONE) 5 MG immediate release tablet Take 1 tablet (5 mg total) by mouth every 8 (eight) hours as needed for up to 10 doses for severe pain. 06/10/21   Wyvonnia Dusky, MD  ?potassium chloride SA (KLOR-CON M) 20 MEQ tablet TAKE (1) TABLET BY MOUTH ONCE A DAY. ?Patient taking differently: Take 20 mEq by mouth daily. 05/18/21    Lindell Spar, MD  ?pregabalin (LYRICA) 100 MG capsule Take 1 capsule (100 mg total) by mouth 2 (two) times daily. 04/26/21   Lindell Spar, MD  ?risperiDONE (RISPERDAL) 0.5 MG tablet Take 0.5 mg by mouth 2 (two) times daily. 05/16/21   [provider]  ?sucralfate (CARAFATE) 1 g tablet Take 1 tablet (1 g total) by mouth 4 (four) times daily -  with meals and at bedtime. 04/05/21   Varney Biles, MD  ?traZODone (DESYREL) 100 MG tablet Take 100 mg by mouth at bedtime. 05/16/21   [provider]  ?   ? ?Allergies    ?Clonopin [clonazepam], Fish allergy, Flexeril [cyclobenzaprine hcl], Ibuprofen, Shellfish allergy, Tylenol [acetaminophen], Ace inhibitors, Cyclobenzaprine, Tramadol, and Trazodone and nefazodone   ? ?Review of Systems   ?Review of Systems  ?Musculoskeletal:  Positive for myalgias.  ?All other systems reviewed and are negative. ? ?Physical Exam ?Updated Vital Signs ?BP 125/80   Pulse 85   Temp 97.7 ?F (36.5 ?C) (Oral)   Resp 16   Ht '4\' 11"'$  (1.499 m)   Wt 85.7 kg   LMP 06/07/2020   SpO2 99%   BMI 38.17 kg/m?  ?Physical Exam ?Vitals and nursing note reviewed.  ?Constitutional:   ?   General: She is not in acute distress. ?   Appearance: Normal appearance. She is well-developed. She is not ill-appearing, toxic-appearing or diaphoretic.  ?HENT:  ?   Head: Normocephalic and atraumatic.  ?   Right Ear: External ear normal.  ?   Left Ear: External ear normal.  ?   Nose: Nose normal.  ?Eyes:  ?   Extraocular Movements: Extraocular movements intact.  ?   Conjunctiva/sclera: Conjunctivae normal.  ?Cardiovascular:  ?   Rate and Rhythm: Normal rate and regular rhythm.  ?   Heart sounds: No murmur heard. ?Pulmonary:  ?   Effort: Pulmonary effort is normal. No respiratory distress.  ?Abdominal:  ?   General: There is no distension.  ?   Palpations: Abdomen is soft.  ?Musculoskeletal:     ?   General: Tenderness (Right hamstring, bilateral calves) present. No swelling or deformity. Normal  range of motion.  ?   Cervical back: Normal range of motion and neck supple. No rigidity.  ?   Right lower leg: No edema.  ?   Left lower leg: No edema.  ?Skin: ?   General: Skin is warm and dry.  ?   Coloration: Skin is not jaundiced or pale.  ?Neurological:  ?   General: No focal deficit present.  ?   Mental Status: She is alert and oriented to person, place, and time.  ?   Cranial Nerves: No cranial nerve deficit.  ?   Sensory: No sensory deficit.  ?  Motor: No weakness.  ?   Coordination: Coordination normal.  ?Psychiatric:     ?   Mood and Affect: Mood normal.     ?   Behavior: Behavior normal.  ? ? ?ED Results / Procedures / Treatments   ?Labs ?(all labs ordered are listed, but only abnormal results are displayed) ?Labs Reviewed  ?COMPREHENSIVE METABOLIC PANEL - Abnormal; Notable for the following components:  ?    Result Value  ? Total Bilirubin 0.2 (*)   ? All other components within normal limits  ?CBC WITH DIFFERENTIAL/PLATELET - Abnormal; Notable for the following components:  ? RDW 15.9 (*)   ? Platelets 644 (*)   ? All other components within normal limits  ?MAGNESIUM  ?CK  ? ? ?EKG ?None ? ?Radiology ?US Venous Img Lower Bilateral ? ?Result Date: 07/02/2021 ?CLINICAL DATA:  Bilateral lower extremity edema. EXAM: BILATERAL LOWER EXTREMITY VENOUS DOPPLER ULTRASOUND TECHNIQUE: Gray-scale sonography with compression, as well as color and duplex ultrasound, were performed to evaluate the deep venous system(s) from the level of the common femoral vein through the popliteal and proximal calf veins. COMPARISON:  None. FINDINGS: VENOUS Normal compressibility of the common femoral, superficial femoral, and popliteal veins, as well as the visualized calf veins. Visualized portions of profunda femoral vein and great saphenous vein unremarkable. No filling defects to suggest DVT on grayscale or color Doppler imaging. Doppler waveforms show normal direction of venous flow, normal respiratory plasticity and response  to augmentation. Limited views of the contralateral common femoral vein are unremarkable. OTHER None. Limitations: none IMPRESSION: Negative. Electronically Signed   By: Misty Stanley M.D.   On: 07/02/2021 10:19   ?

## 2021-07-02 NOTE — ED Triage Notes (Signed)
Patient c/o bilateral leg pain with swelling that started on Friday. Denies any known injury. Patient reports taking Hydrocodone on Friday with no relief. Pedal pulses strong. ? ?

## 2021-07-04 ENCOUNTER — Other Ambulatory Visit: Payer: Self-pay

## 2021-07-04 ENCOUNTER — Ambulatory Visit (INDEPENDENT_AMBULATORY_CARE_PROVIDER_SITE_OTHER): Payer: 59 | Admitting: Gastroenterology

## 2021-07-04 ENCOUNTER — Other Ambulatory Visit (INDEPENDENT_AMBULATORY_CARE_PROVIDER_SITE_OTHER): Payer: Self-pay

## 2021-07-04 ENCOUNTER — Telehealth: Payer: Self-pay

## 2021-07-04 ENCOUNTER — Telehealth: Payer: Self-pay | Admitting: Pulmonary Disease

## 2021-07-04 ENCOUNTER — Encounter (INDEPENDENT_AMBULATORY_CARE_PROVIDER_SITE_OTHER): Payer: Self-pay | Admitting: Gastroenterology

## 2021-07-04 ENCOUNTER — Encounter (INDEPENDENT_AMBULATORY_CARE_PROVIDER_SITE_OTHER): Payer: Self-pay

## 2021-07-04 ENCOUNTER — Telehealth: Payer: Self-pay | Admitting: Hematology and Oncology

## 2021-07-04 VITALS — BP 132/86 | HR 80 | Temp 97.7°F | Ht 59.0 in | Wt 188.8 lb

## 2021-07-04 DIAGNOSIS — K625 Hemorrhage of anus and rectum: Secondary | ICD-10-CM

## 2021-07-04 DIAGNOSIS — K5900 Constipation, unspecified: Secondary | ICD-10-CM

## 2021-07-04 DIAGNOSIS — F411 Generalized anxiety disorder: Secondary | ICD-10-CM

## 2021-07-04 DIAGNOSIS — K219 Gastro-esophageal reflux disease without esophagitis: Secondary | ICD-10-CM

## 2021-07-04 DIAGNOSIS — J4540 Moderate persistent asthma, uncomplicated: Secondary | ICD-10-CM

## 2021-07-04 DIAGNOSIS — I10 Essential (primary) hypertension: Secondary | ICD-10-CM

## 2021-07-04 DIAGNOSIS — R1030 Lower abdominal pain, unspecified: Secondary | ICD-10-CM

## 2021-07-04 DIAGNOSIS — R600 Localized edema: Secondary | ICD-10-CM

## 2021-07-04 DIAGNOSIS — M48062 Spinal stenosis, lumbar region with neurogenic claudication: Secondary | ICD-10-CM

## 2021-07-04 MED ORDER — OMEPRAZOLE 20 MG PO CPDR: 20.0000 mg | DELAYED_RELEASE_CAPSULE | Freq: Every day | ORAL | 3 refills | Status: DC

## 2021-07-04 MED ORDER — FLUTICASONE-SALMETEROL 250-50 MCG/ACT IN AEPB: 1.0000 | INHALATION_SPRAY | Freq: Two times a day (BID) | RESPIRATORY_TRACT | 3 refills | Status: DC

## 2021-07-04 MED ORDER — AMLODIPINE BESYLATE 5 MG PO TABS: 5.0000 mg | ORAL_TABLET | Freq: Every day | ORAL | 0 refills | Status: DC

## 2021-07-04 MED ORDER — CLONIDINE HCL 0.1 MG PO TABS: 0.1000 mg | ORAL_TABLET | Freq: Every day | ORAL | 0 refills | Status: DC

## 2021-07-04 MED ORDER — ALBUTEROL SULFATE HFA 108 (90 BASE) MCG/ACT IN AERS: 2.0000 | INHALATION_SPRAY | Freq: Four times a day (QID) | RESPIRATORY_TRACT | 5 refills | Status: DC | PRN

## 2021-07-04 MED ORDER — FUROSEMIDE 20 MG PO TABS: 20.0000 mg | ORAL_TABLET | Freq: Every day | ORAL | 0 refills | Status: DC

## 2021-07-04 MED ORDER — DIAZEPAM 5 MG PO TABS: 5.0000 mg | ORAL_TABLET | Freq: Every evening | ORAL | 0 refills | Status: DC | PRN

## 2021-07-04 MED ORDER — PREGABALIN 100 MG PO CAPS: 100.0000 mg | ORAL_CAPSULE | Freq: Two times a day (BID) | ORAL | 2 refills | Status: DC

## 2021-07-04 MED ORDER — SUCRALFATE 1 G PO TABS: 1.0000 g | ORAL_TABLET | Freq: Three times a day (TID) | ORAL | 0 refills | Status: DC

## 2021-07-04 MED ORDER — ALBUTEROL SULFATE (2.5 MG/3ML) 0.083% IN NEBU: 2.5000 mg | INHALATION_SOLUTION | Freq: Four times a day (QID) | RESPIRATORY_TRACT | 11 refills | Status: DC | PRN

## 2021-07-04 MED ORDER — POTASSIUM CHLORIDE CRYS ER 20 MEQ PO TBCR: 20.0000 meq | EXTENDED_RELEASE_TABLET | Freq: Every day | ORAL | 1 refills | Status: DC

## 2021-07-04 MED ORDER — APIXABAN 5 MG PO TABS: 5.0000 mg | ORAL_TABLET | Freq: Two times a day (BID) | ORAL | 0 refills | Status: DC

## 2021-07-04 MED ORDER — METHOCARBAMOL 500 MG PO TABS: 500.0000 mg | ORAL_TABLET | Freq: Two times a day (BID) | ORAL | 0 refills | Status: DC

## 2021-07-04 NOTE — Telephone Encounter (Signed)
Medications have been sent to [facility] for pt. Nothing further needed. ?

## 2021-07-04 NOTE — Telephone Encounter (Signed)
[facility] [phone] patient need med refill ? ? ?amLODipine (NORVASC) 5 MG tablet ? ?cloNIDine (CATAPRES) 0.1 MG tablet  ? ?diazepam (VALIUM) 5 MG tablet ? ?apixaban (ELIQUIS) 5 MG TABS tablet ? ?furosemide (LASIX) 20 MG tablet ? ?methocarbamol (ROBAXIN) 500 MG tablet  ? ?omeprazole (PRILOSEC) 20 MG capsule  ? ?potassium chloride SA (KLOR-CON M) 20 MEQ tablet  ? ?pregabalin (LYRICA) 75 MG capsule  ? ?sucralfate (CARAFATE) 1 g tablet ? ?Tizanidine 2 mg ? ? ?Pharmacy: [facility] ?

## 2021-07-04 NOTE — Telephone Encounter (Signed)
Refills sent/ printed for signature  ?

## 2021-07-04 NOTE — Telephone Encounter (Signed)
R/s pt's new hem appt. Pt is aware of new appt date and time.  °

## 2021-07-04 NOTE — Patient Instructions (Signed)
Start taking Miralax 1 capful every day for one week. If bowel movements do not improve, increase to 1 capful every 12 hours. If after two weeks there is no improvement, increase to 1 capful every 8 hours ? ?Make sure you are drinking plenty of water, eating a diet high in fruits, veggies and whole grains. Prunes and kiwi are both good for constipation ? ?Be mindful that opiate pain medications such as oxycodone and hydrocodone can worsen constipation ? ?We will get you set up for colonoscopy for further evaluation of your symptoms ? ?Avoid straining as much as possible, I will resend your hemorrhoid cream, you can also use over the counter tucks pads and do sitz baths to help with itching/burning. ? ?Follow up 3-4 months ?

## 2021-07-04 NOTE — Progress Notes (Signed)
? ?Referring Provider: Lindell Spar, MD ?Primary Care Physician:  Lindell Spar, MD ?Primary GI Physician: new ? ?Chief Complaint  ?Patient presents with  ? Abdominal Pain  ?  ED follow up for abdominal pain and constipation. About the same as when in the hospital. Needs refill on anusol rectal cream for hemorrhoids.   ? ?HPI:   ?Kathryn [name] is a 51 y.o. female with past medical history of anxiety, arthritis, asthma, bipolar disorder, COPD, Depression, GERD, HTN, OA, sleep apnea, DVT on Eliquis.  ? ?Patient presenting today for ED follow up of abdominal pain and constipation.  ? ?ED visit 06/10/21 with RLQ pain and loose stools. Denied nausea, vomiting or rectal bleeding. CT A/P with contrast done at that time with no acute abnormality in abd or pelvis.  ? ?Patient states that she had a lower back surgery in 2020. She has had some issues with constipation since then. She reports that she has some lower abdominal pain for the past 2 months, usually improves if she is able to have a BM. She reports that she constantly has pressure in her rectum like she needs to have a BM. She feels like she never really has a good BM. States she took some maalox but has not had a BM yet. She has some bleeding when wiping, has some itching and burning. She reports that PCP gave her some hemorrhoid cream which helps with the burning. She states that appetite is not good due to feeling full from not having a BM. She states she has some vomiting at times when she has not had a BM. She has gone 7 days without a BM before. Has not tried anything else for constipation. ? ?She takes omeprazole '20mg'$  once daily for GERD, this provides good results for her. Has had occasional issues with dysphagia but reports none recently.  ? ?No red flag symptoms. Patient denies melena, nausea, vomiting, diarrhea, dysphagia, odyonophagia, early satiety or weight loss.  ? ? ?NSAID use:no NSAID use ?Social hx:no etoh or tobacco ?Fam hx: father had CRC  at 66 years of age ? ?Last Colonoscopy:never ?Cologuard in feb 2023 was negative ? ?Last Endoscopy:12/25/18- normal hypopharynx. ?- Normal esophagus. ?- Z-line irregular, 35 cm from the incisors. ?- 3 cm hiatal hernia. ?- Erythematous mucosa in the gastric body. ?- Normal duodenal bulb and second portion of the duodenum. ?- No specimens collected. ? ?Recommendations:  ? ? ?Past Medical History:  ?Diagnosis Date  ? Anxiety   ? Arthritis   ? Asthma   ? Bipolar disorder ([place])   ? Chronic back pain   ? COPD (chronic obstructive pulmonary disease) ([place])   ? Chronic bronchitis  ? Depression   ? GERD (gastroesophageal reflux disease)   ? Hypertension   ? Neuropathy   ? Osteoarthritis of left knee, 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  ? Sleep apnea 04/2020  ? GETTING A cpap  ? Suicidal ideation 12/24/2018  ? ? ?Past Surgical History:  ?Procedure Laterality Date  ? BACK SURGERY    ? DILATATION AND CURETTAGE/HYSTEROSCOPY WITH MINERVA N/A 06/09/2020  ? Procedure: DILATATION AND CURETTAGE/HYSTEROSCOPY WITH MINERVA;  Surgeon: Florian Buff, MD;  Location: [facility];  Service: Gynecology;  Laterality: N/A;  ? ESOPHAGOGASTRODUODENOSCOPY (EGD) WITH PROPOFOL N/A 12/25/2018  ? Procedure: ESOPHAGOGASTRODUODENOSCOPY (EGD) WITH PROPOFOL;  Surgeon: Rogene Houston, MD;  Location: [facility] SUITE;  Service: Endoscopy;  Laterality: N/A;  ? PATELLA-FEMORAL ARTHROPLASTY Left 10/08/2018  ? Procedure: PATELLA-FEMORAL ARTHROPLASTY;  Surgeon: Marchia Bond, MD;  Location: [facility];  Service: Orthopedics;  Laterality: Left;  ? TUBAL LIGATION    ? ? ?Current Outpatient Medications  ?Medication Sig Dispense Refill  ? albuterol (PROVENTIL) (2.5 MG/3ML) 0.083% nebulizer solution Take 2.5 mg by nebulization every 6 (six) hours as needed for wheezing or shortness of breath.    ? amLODipine (NORVASC) 5 MG tablet Take 1 tablet (5 mg total) by mouth daily. for blood pressure 90 tablet 0  ? apixaban (ELIQUIS) 5 MG TABS tablet Take 1 tablet (5 mg total) by mouth 2 (two) times  daily. 60 tablet 0  ? Cholecalciferol (VITAMIN D) 50 MCG (2000 UT) CAPS Take 1 capsule by mouth daily.    ? citalopram (CELEXA) 20 MG tablet Take 20 mg by mouth daily.    ? cloNIDine (CATAPRES) 0.1 MG tablet Take 1 tablet (0.1 mg total) by mouth daily. 90 tablet 0  ? Cyanocobalamin (VITAMIN B 12 PO) Take 1 tablet by mouth daily.    ? diazepam (VALIUM) 5 MG tablet Take 1 tablet (5 mg total) by mouth at bedtime as needed for anxiety. 30 tablet 0  ? DULoxetine (CYMBALTA) 30 MG capsule Take 1 capsule (30 mg total) by mouth 2 (two) times daily. 60 capsule 5  ? fluticasone-salmeterol ([place] INHUB) 250-50 MCG/ACT AEPB Inhale 1 puff into the lungs in the morning and at bedtime. 180 each 2  ? furosemide (LASIX) 20 MG tablet Take 1 tablet (20 mg total) by mouth daily. 30 tablet 0  ? hydrocortisone (ANUSOL-HC) 2.5 % rectal cream Place 1 application rectally 2 (two) times daily. 30 g 0  ? hydrOXYzine (VISTARIL) 50 MG capsule Take 1 capsule (50 mg total) by mouth daily as needed for anxiety (Insomnia). (Patient taking differently: Take 50 mg by mouth daily.) 30 capsule 5  ? methocarbamol (ROBAXIN) 500 MG tablet Take 1 tablet (500 mg total) by mouth 2 (two) times daily. 20 tablet 0  ? Misc. Devices MISC Rolling walker - ICD10: M48.061, Z98.1, M17.9 1 each 0  ? omeprazole (PRILOSEC) 20 MG capsule Take 1 capsule (20 mg total) by mouth daily. 90 capsule 3  ? ondansetron (ZOFRAN-ODT) 4 MG disintegrating tablet Take 1 tablet (4 mg total) by mouth every 8 (eight) hours as needed for up to 10 doses for nausea or vomiting. 10 tablet 0  ? oxyCODONE (ROXICODONE) 5 MG immediate release tablet Take 1 tablet (5 mg total) by mouth every 8 (eight) hours as needed for up to 10 doses for severe pain. 10 tablet 0  ? potassium chloride SA (KLOR-CON M) 20 MEQ tablet TAKE (1) TABLET BY MOUTH ONCE A DAY. (Patient taking differently: Take 20 mEq by mouth daily.) 30 tablet 0  ? pregabalin (LYRICA) 100 MG capsule Take 1 capsule (100 mg total) by mouth 2  (two) times daily. 60 capsule 2  ? risperiDONE (RISPERDAL) 0.5 MG tablet Take 0.5 mg by mouth 2 (two) times daily.    ? sucralfate (CARAFATE) 1 g tablet Take 1 tablet (1 g total) by mouth 4 (four) times daily -  with meals and at bedtime. 90 tablet 0  ? traZODone (DESYREL) 100 MG tablet Take 100 mg by mouth at bedtime.    ? ?No current facility-administered medications for this visit.  ? ? ?Allergies as of 07/04/2021 - Review Complete 07/04/2021  ?Allergen Reaction Noted  ? Clonopin [clonazepam] Anaphylaxis 06/26/2014  ? Fish allergy Anaphylaxis, Shortness Of Breath, and Swelling 06/12/2013  ? Flexeril [cyclobenzaprine hcl] Shortness Of Breath 11/04/2010  ? Ibuprofen  Anaphylaxis, Hives, and Other (See Comments) 11/04/2010  ? Shellfish allergy Anaphylaxis 10/08/2018  ? Tylenol [acetaminophen] Anaphylaxis 06/09/2020  ? Ace inhibitors Cough 05/11/2017  ? Cyclobenzaprine  03/29/2020  ? Tramadol Nausea And Vomiting 06/12/2013  ? Trazodone and nefazodone Hives 05/19/2020  ? ? ?Family History  ?Problem Relation Age of Onset  ? Gout Paternal Grandfather   ? Cirrhosis Paternal Grandfather   ? Hypertension Paternal Grandmother   ? Aneurysm Paternal Grandmother   ? Cirrhosis Maternal Grandmother   ? Cirrhosis Maternal Grandfather   ? Cancer Father   ? Cirrhosis Father   ? Cirrhosis Mother   ? Breast cancer Sister   ? Hypertension Sister   ? Bronchitis Daughter   ? Bronchitis Daughter   ? Asthma Son   ? Bronchitis Son   ? Migraines Neg Hx   ? ? ?Social History  ? ?Socioeconomic History  ? Marital status: Single  ?  Spouse name: Not on file  ? Number of children: Not on file  ? Years of education: Not on file  ? Highest education level: Not on file  ?Occupational History  ? Not on file  ?Tobacco Use  ? Smoking status: Former  ?  Types: Cigarettes  ?  Passive exposure: Current  ? Smokeless tobacco: Never  ? Tobacco comments:  ?  Occasional smoker  ?Vaping Use  ? Vaping Use: Never used  ?Substance and Sexual Activity  ? Alcohol  use: Not Currently  ? Drug use: Not Currently  ?  Types: Cocaine  ?  Comment: crack  last used 2016  ? Sexual activity: Yes  ?  Birth control/protection: Surgical  ?  Comment: tubal, ablation  ?Other

## 2021-07-05 ENCOUNTER — Telehealth: Payer: Self-pay

## 2021-07-05 ENCOUNTER — Other Ambulatory Visit: Payer: Self-pay | Admitting: Internal Medicine

## 2021-07-05 DIAGNOSIS — R600 Localized edema: Secondary | ICD-10-CM

## 2021-07-05 NOTE — Telephone Encounter (Signed)
Televisit scheduled.

## 2021-07-05 NOTE — Telephone Encounter (Signed)
Patient called needs a face mask and tube that goes with her nebulizer to be called into [facility] ? ?

## 2021-07-05 NOTE — Telephone Encounter (Signed)
Patient is requesting a referral to [facility] on [address]  ?

## 2021-07-05 NOTE — Telephone Encounter (Signed)
Attempted to make pt televisit appt na nvm please schedule televisit per provider  ?

## 2021-07-06 ENCOUNTER — Encounter (INDEPENDENT_AMBULATORY_CARE_PROVIDER_SITE_OTHER): Payer: Self-pay

## 2021-07-06 ENCOUNTER — Telehealth (INDEPENDENT_AMBULATORY_CARE_PROVIDER_SITE_OTHER): Payer: Self-pay

## 2021-07-06 ENCOUNTER — Other Ambulatory Visit: Payer: Self-pay | Admitting: *Deleted

## 2021-07-06 DIAGNOSIS — G4719 Other hypersomnia: Secondary | ICD-10-CM

## 2021-07-06 MED ORDER — NEBULIZER/TUBING/MOUTHPIECE KIT: PACK | 0 refills | Status: DC

## 2021-07-06 NOTE — Telephone Encounter (Signed)
Thanks, please inform the patient about this ?

## 2021-07-06 NOTE — Telephone Encounter (Signed)
Faxed to [facility] 

## 2021-07-06 NOTE — Telephone Encounter (Signed)
Per Dr Posey Pronto it is ok to hold Eliquis 2 days prior to procedure if absolutely necessary .  ?

## 2021-07-07 ENCOUNTER — Telehealth: Payer: Self-pay

## 2021-07-12 ENCOUNTER — Other Ambulatory Visit: Payer: Self-pay | Admitting: Internal Medicine

## 2021-07-12 ENCOUNTER — Ambulatory Visit: Payer: 59 | Admitting: Internal Medicine

## 2021-07-15 ENCOUNTER — Inpatient Hospital Stay (HOSPITAL_BASED_OUTPATIENT_CLINIC_OR_DEPARTMENT_OTHER): Payer: 59 | Admitting: Hematology and Oncology

## 2021-07-15 ENCOUNTER — Telehealth: Payer: Self-pay | Admitting: Internal Medicine

## 2021-07-15 ENCOUNTER — Other Ambulatory Visit: Payer: Self-pay

## 2021-07-15 ENCOUNTER — Inpatient Hospital Stay: Payer: 59

## 2021-07-15 VITALS — BP 116/78 | HR 117 | Temp 97.9°F | Resp 20 | Ht 59.0 in | Wt 183.7 lb

## 2021-07-15 DIAGNOSIS — Z7901 Long term (current) use of anticoagulants: Secondary | ICD-10-CM | POA: Diagnosis not present

## 2021-07-15 DIAGNOSIS — F17210 Nicotine dependence, cigarettes, uncomplicated: Secondary | ICD-10-CM | POA: Diagnosis not present

## 2021-07-15 DIAGNOSIS — I10 Essential (primary) hypertension: Secondary | ICD-10-CM | POA: Diagnosis not present

## 2021-07-15 DIAGNOSIS — Z803 Family history of malignant neoplasm of breast: Secondary | ICD-10-CM | POA: Diagnosis not present

## 2021-07-15 DIAGNOSIS — F419 Anxiety disorder, unspecified: Secondary | ICD-10-CM | POA: Diagnosis not present

## 2021-07-15 DIAGNOSIS — J45909 Unspecified asthma, uncomplicated: Secondary | ICD-10-CM | POA: Diagnosis not present

## 2021-07-15 DIAGNOSIS — Z79899 Other long term (current) drug therapy: Secondary | ICD-10-CM | POA: Diagnosis not present

## 2021-07-15 DIAGNOSIS — M170 Bilateral primary osteoarthritis of knee: Secondary | ICD-10-CM | POA: Diagnosis not present

## 2021-07-15 DIAGNOSIS — Z86718 Personal history of other venous thrombosis and embolism: Secondary | ICD-10-CM | POA: Diagnosis not present

## 2021-07-15 DIAGNOSIS — D75839 Thrombocytosis, unspecified: Secondary | ICD-10-CM | POA: Diagnosis present

## 2021-07-15 DIAGNOSIS — M129 Arthropathy, unspecified: Secondary | ICD-10-CM | POA: Diagnosis not present

## 2021-07-15 DIAGNOSIS — J449 Chronic obstructive pulmonary disease, unspecified: Secondary | ICD-10-CM | POA: Diagnosis not present

## 2021-07-15 DIAGNOSIS — K219 Gastro-esophageal reflux disease without esophagitis: Secondary | ICD-10-CM | POA: Diagnosis not present

## 2021-07-15 DIAGNOSIS — F329 Major depressive disorder, single episode, unspecified: Secondary | ICD-10-CM | POA: Diagnosis not present

## 2021-07-15 DIAGNOSIS — G4730 Sleep apnea, unspecified: Secondary | ICD-10-CM | POA: Diagnosis not present

## 2021-07-15 LAB — CBC WITH DIFFERENTIAL (CANCER CENTER ONLY)
Abs Immature Granulocytes: 0.04 10*3/uL (ref 0.00–0.07)
Basophils Absolute: 0 10*3/uL (ref 0.0–0.1)
Basophils Relative: 0 %
Eosinophils Absolute: 0.1 10*3/uL (ref 0.0–0.5)
Eosinophils Relative: 2 %
HCT: 37.8 % (ref 36.0–46.0)
Hemoglobin: 12.5 g/dL (ref 12.0–15.0)
Immature Granulocytes: 1 %
Lymphocytes Relative: 27 %
Lymphs Abs: 2.2 10*3/uL (ref 0.7–4.0)
MCH: 27.4 pg (ref 26.0–34.0)
MCHC: 33.1 g/dL (ref 30.0–36.0)
MCV: 82.7 fL (ref 80.0–100.0)
Monocytes Absolute: 0.5 10*3/uL (ref 0.1–1.0)
Monocytes Relative: 6 %
Neutro Abs: 5.3 10*3/uL (ref 1.7–7.7)
Neutrophils Relative %: 64 %
Platelet Count: 706 10*3/uL — ABNORMAL HIGH (ref 150–400)
RBC: 4.57 MIL/uL (ref 3.87–5.11)
RDW: 16.2 % — ABNORMAL HIGH (ref 11.5–15.5)
WBC Count: 8.2 10*3/uL (ref 4.0–10.5)
nRBC: 0 % (ref 0.0–0.2)

## 2021-07-15 LAB — IRON AND IRON BINDING CAPACITY (CC-WL,HP ONLY)
Iron: 30 ug/dL (ref 28–170)
Saturation Ratios: 6 % — ABNORMAL LOW (ref 10.4–31.8)
TIBC: 522 ug/dL — ABNORMAL HIGH (ref 250–450)
UIBC: 492 ug/dL — ABNORMAL HIGH (ref 148–442)

## 2021-07-15 NOTE — Progress Notes (Signed)
[facility] ?CONSULT NOTE ? ?Patient Care Team: ?Lindell Spar, MD as PCP - General (Internal Medicine) ? ?CHIEF COMPLAINTS/PURPOSE OF CONSULTATION:  ?History of blood clots, thrombocytosis ? ?HISTORY OF PRESENTING ILLNESS:  ?Kathryn [name] 51 y.o. female is here because of history of blood clots and thrombocytosis she presents to the clinic today for a consult and labs.  She has pretty profound osteoarthritis of the right knee she has a knee brace on and is having a lot of pain related to that.  She needs a knee replacement surgery but because of the thrombocytosis and her history of blood clots she was referred to [name] for evaluation.  I went back and reviewed her labs and she has had thrombocytosis for the last 6 to 7 years.  It has progressively gotten worse.  She developed a blood clot previously postoperatively after her back surgery.  She took Eliquis for that. ? ?I reviewed her records extensively and collaborated the history with the patient. ?The ?MEDICAL HISTORY:  ?Past Medical History:  ?Diagnosis Date  ? Anxiety   ? Arthritis   ? Asthma   ? Bipolar disorder ([place])   ? Chronic back pain   ? COPD (chronic obstructive pulmonary disease) ([place])   ? Chronic bronchitis  ? Depression   ? GERD (gastroesophageal reflux disease)   ? Hypertension   ? Neuropathy   ? Osteoarthritis of left knee, 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  ? Sleep apnea 04/2020  ? GETTING A cpap  ? Suicidal ideation 12/24/2018  ? ? ?SURGICAL HISTORY: ?Past Surgical History:  ?Procedure Laterality Date  ? BACK SURGERY    ? DILATATION AND CURETTAGE/HYSTEROSCOPY WITH MINERVA N/A 06/09/2020  ? Procedure: DILATATION AND CURETTAGE/HYSTEROSCOPY WITH MINERVA;  Surgeon: Florian Buff, MD;  Location: [facility];  Service: Gynecology;  Laterality: N/A;  ? ESOPHAGOGASTRODUODENOSCOPY (EGD) WITH PROPOFOL N/A 12/25/2018  ? Procedure: ESOPHAGOGASTRODUODENOSCOPY (EGD) WITH PROPOFOL;  Surgeon: Rogene Houston, MD;  Location: [facility];  Service: Endoscopy;   Laterality: N/A;  ? PATELLA-FEMORAL ARTHROPLASTY Left 10/08/2018  ? Procedure: PATELLA-FEMORAL ARTHROPLASTY;  Surgeon: Marchia Bond, MD;  Location: [facility];  Service: Orthopedics;  Laterality: Left;  ? TUBAL LIGATION    ? ? ?SOCIAL HISTORY: ?Social History  ? ?Socioeconomic History  ? Marital status: Single  ?  Spouse name: Not on file  ? Number of children: Not on file  ? Years of education: Not on file  ? Highest education level: Not on file  ?Occupational History  ? Not on file  ?Tobacco Use  ? Smoking status: Former  ?  Types: Cigarettes  ?  Passive exposure: Current  ? Smokeless tobacco: Never  ? Tobacco comments:  ?  Occasional smoker  ?Vaping Use  ? Vaping Use: Never used  ?Substance and Sexual Activity  ? Alcohol use: Not Currently  ? Drug use: Not Currently  ?  Types: Cocaine  ?  Comment: crack  last used 2016  ? Sexual activity: Yes  ?  Birth control/protection: Surgical  ?  Comment: tubal, ablation  ?Other Topics Concern  ? Not on file  ?Social History Narrative  ? R handed   ? Lives with boyfriend  ? 1 Cup of caffeine daily   ? ?Social Determinants of Health  ? ?Financial Resource Strain: Not on file  ?Food Insecurity: No Food Insecurity  ? Worried About Charity fundraiser in the Last Year: Never true  ? Ran Out of Food in the Last Year: Never true  ?  Transportation Needs: No Transportation Needs  ? Lack of Transportation (Medical): No  ? Lack of Transportation (Non-Medical): No  ?Physical Activity: Not on file  ?Stress: Not on file  ?Social Connections: Not on file  ?Intimate Partner Violence: Not on file  ? ? ?FAMILY HISTORY: ?Family History  ?Problem Relation Age of Onset  ? Gout Paternal Grandfather   ? Cirrhosis Paternal Grandfather   ? Hypertension Paternal Grandmother   ? Aneurysm Paternal Grandmother   ? Cirrhosis Maternal Grandmother   ? Cirrhosis Maternal Grandfather   ? Cancer Father   ? Cirrhosis Father   ? Cirrhosis Mother   ? Breast cancer Sister   ? Hypertension Sister   ? Bronchitis  Daughter   ? Bronchitis Daughter   ? Asthma Son   ? Bronchitis Son   ? Migraines Neg Hx   ? ? ?ALLERGIES:  is allergic to clonopin [clonazepam], fish allergy, flexeril [cyclobenzaprine hcl], ibuprofen, shellfish allergy, tylenol [acetaminophen], ace inhibitors, cyclobenzaprine, tramadol, and trazodone and nefazodone. ? ?MEDICATIONS:  ?Current Outpatient Medications  ?Medication Sig Dispense Refill  ? albuterol (PROVENTIL) (2.5 MG/3ML) 0.083% nebulizer solution Take 3 mLs (2.5 mg total) by nebulization every 6 (six) hours as needed for wheezing or shortness of breath. 75 mL 11  ? albuterol (VENTOLIN HFA) 108 (90 Base) MCG/ACT inhaler Inhale 2 puffs into the lungs every 6 (six) hours as needed for wheezing or shortness of breath. 18 g 5  ? amLODipine (NORVASC) 5 MG tablet Take 1 tablet (5 mg total) by mouth daily. for blood pressure 90 tablet 0  ? Cholecalciferol (VITAMIN D) 50 MCG (2000 UT) CAPS Take 1 capsule by mouth daily.    ? citalopram (CELEXA) 20 MG tablet Take 20 mg by mouth daily.    ? cloNIDine (CATAPRES) 0.1 MG tablet Take 1 tablet (0.1 mg total) by mouth daily. 90 tablet 0  ? Cyanocobalamin (VITAMIN B 12 PO) Take 1 tablet by mouth daily.    ? diazepam (VALIUM) 5 MG tablet Take 1 tablet (5 mg total) by mouth at bedtime as needed for anxiety. 30 tablet 0  ? DULoxetine (CYMBALTA) 30 MG capsule Take 1 capsule (30 mg total) by mouth 2 (two) times daily. 60 capsule 5  ? ELIQUIS 5 MG TABS tablet TAKE ONE (1) TABLET BY MOUTH TWICE DAILY 60 tablet 10  ? fluticasone-salmeterol (WIXELA INHUB) 250-50 MCG/ACT AEPB Inhale 1 puff into the lungs in the morning and at bedtime. 180 each 3  ? furosemide (LASIX) 20 MG tablet TAKE 1 TABLET BY MOUTH DAILY 30 tablet 10  ? hydrocortisone (ANUSOL-HC) 2.5 % rectal cream Place 1 application rectally 2 (two) times daily. 30 g 0  ? hydrOXYzine (VISTARIL) 50 MG capsule Take 1 capsule (50 mg total) by mouth daily as needed for anxiety (Insomnia). (Patient taking differently: Take 50 mg  by mouth daily.) 30 capsule 5  ? methocarbamol (ROBAXIN) 500 MG tablet TAKE ONE (1) TABLET BY MOUTH TWICE DAILY 20 tablet 10  ? Misc. Devices MISC Rolling walker - ICD10: M48.061, Z98.1, M17.9 1 each 0  ? omeprazole (PRILOSEC) 20 MG capsule Take 1 capsule (20 mg total) by mouth daily. 90 capsule 3  ? ondansetron (ZOFRAN-ODT) 4 MG disintegrating tablet Take 1 tablet (4 mg total) by mouth every 8 (eight) hours as needed for up to 10 doses for nausea or vomiting. 10 tablet 0  ? oxyCODONE (ROXICODONE) 5 MG immediate release tablet Take 1 tablet (5 mg total) by mouth every 8 (eight) hours as  needed for up to 10 doses for severe pain. 10 tablet 0  ? potassium chloride SA (KLOR-CON M) 20 MEQ tablet Take 1 tablet (20 mEq total) by mouth daily. TAKE (1) TABLET BY MOUTH ONCE A DAY 30 tablet 1  ? pregabalin (LYRICA) 100 MG capsule Take 1 capsule (100 mg total) by mouth 2 (two) times daily. 60 capsule 2  ? Respiratory Therapy Supplies (NEBULIZER/TUBING/MOUTHPIECE) KIT Face mask and tubing for nebulizer machine 1 kit 0  ? risperiDONE (RISPERDAL) 0.5 MG tablet Take 0.5 mg by mouth 2 (two) times daily.    ? sucralfate (CARAFATE) 1 g tablet TAKE 1 TABLET BY MOUTH FOUR TIMES DAILY WITH MEALS AND AT BEDTIME 90 tablet 10  ? traZODone (DESYREL) 100 MG tablet Take 100 mg by mouth at bedtime.    ? ?No current facility-administered medications for this visit.  ? ? ?REVIEW OF SYSTEMS:   ?Constitutional: Denies fevers, chills or abnormal night sweats ?  ?All other systems were reviewed with the patient and are negative. ? ?PHYSICAL EXAMINATION: ?ECOG PERFORMANCE STATUS: 1 - Symptomatic but completely ambulatory ? ?Vitals:  ? 07/15/21 1519  ?BP: 116/78  ?Pulse: (!) 117  ?Resp: 20  ?Temp: 97.9 ?F (36.6 ?C)  ?SpO2: 98%  ? ?Filed Weights  ? 07/15/21 1519  ?Weight: 183 lb 11.2 oz (83.3 kg)  ? ?  ? ?LABORATORY DATA:  ?I have reviewed the data as listed ?Lab Results  ?Component Value Date  ? WBC 6.7 07/02/2021  ? HGB 12.3 07/02/2021  ? HCT 38.4  07/02/2021  ? MCV 85.0 07/02/2021  ? PLT 644 (H) 07/02/2021  ? ?Lab Results  ?Component Value Date  ? NA 140 07/02/2021  ? K 3.6 07/02/2021  ? CL 107 07/02/2021  ? CO2 25 07/02/2021  ? ? ?RADIOGRAPHIC ST

## 2021-07-15 NOTE — Telephone Encounter (Signed)
FL2 FORMS ? ?NOTED  ?COPIED ?SLEEVED ?

## 2021-07-15 NOTE — Assessment & Plan Note (Signed)
08/31/2020: Right leg: DVT involving posterior tibial and right peroneal vein, left leg: Age-indeterminate DVT left gastrocnemius vein ?Repeat ultrasounds 01/12/2021, 05/23/2021, 07/02/2021: Negative for DVT ? ?I discussed with the patient risk factors for blood clots. ? ?Inherited risk factors include: ?1. Factor V Leiden mutation ?2. Prothrombin gene [id number] ?3. Protein S deficiency  ?4. Protein C deficiency  ?5. Antithrombin deficiency ? ?Acquired risk factors include: ?1. Antiphospholipid antibody syndrome ?2. Tobacco use ?3. Obesity ?4. Medications including oral contraceptives ?5. Sedentary behavior  ? ? ?Workup recommended: ?Since this is the first episode of DVT, we do not have to do extensive work-up for inherited hypercoagulable risk factors.  If any future DVT happens then she will need to do the work-up and consider indefinite anticoagulation. ?

## 2021-07-18 LAB — FERRITIN: Ferritin: 54 ng/mL (ref 11–307)

## 2021-07-18 NOTE — Patient Instructions (Signed)
? ? ? ? ? ? Kathryn [name] ? 07/18/2021  ?  ? '@PREFPERIOPPHARMACY'$ @ ? ? Your procedure is scheduled on  07/26/2021. ? ? Report to [facility] at  1145  A.M. ? ? Call this number if you have problems the morning of surgery: ? [phone] ? ? Remember: ? Follow the diet and prep instructions given to you by the office. ? ? ? Use your nebulizer and your inhaler before you come. Bring your rescue inhaler with you. ? ? ? Take these medicines the morning of surgery with A SIP OF WATER  ? ?amlodipine, celexa, clonidine, cymbalta, hydroxyzine, robaxin, omeprazole, zofran (if needed), oxycodone(if needed), lyrica, risperdal. ?  ? Do not wear jewelry, make-up or nail polish. ? Do not wear lotions, powders, or perfumes, or deodorant. ? Do not shave 48 hours prior to surgery.  Men may shave face and neck. ? Do not bring valuables to the hospital. ? [facility] is not responsible for any belongings or valuables. ? ?Contacts, dentures or bridgework may not be worn into surgery.  Leave your suitcase in the car.  After surgery it may be brought to your room. ? ?For patients admitted to the hospital, discharge time will be determined by your treatment team. ? ?Patients discharged the day of surgery will not be allowed to drive home and must have someone with you for 24 hours.  ? ? ?Special instructions:   DO NOT smoke tobacco or vape for 24 hours before your procedure. ? ?Please read over the following fact sheets that you were given. ?Anesthesia Post-op Instructions and Care and Recovery After Surgery ?  ? ? ? Colonoscopy, Adult, Care After ?This sheet gives you information about how to care for yourself after your procedure. Your health care provider may also give you more specific instructions. If you have problems or questions, contact your health care provider. ?What can I expect after the procedure? ?After the procedure, it is common to have: ?A small amount of blood in your stool for 24 hours after the procedure. ?Some  gas. ?Mild cramping or bloating of your abdomen. ?Follow these instructions at home: ?Eating and drinking ? ?Drink enough fluid to keep your urine pale yellow. ?Follow instructions from your health care provider about eating or drinking restrictions. ?Resume your normal diet as instructed by your health care provider. Avoid heavy or fried foods that are hard to digest. ?Activity ?Rest as told by your health care provider. ?Avoid sitting for a long time without moving. Get up to take short walks every 1-2 hours. This is important to improve blood flow and breathing. Ask for help if you feel weak or unsteady. ?Return to your normal activities as told by your health care provider. Ask your health care provider what activities are safe for you. ?Managing cramping and bloating ? ?Try walking around when you have cramps or feel bloated. ?Apply heat to your abdomen as told by your health care provider. Use the heat source that your health care provider recommends, such as a moist heat pack or a heating pad. ?Place a towel between your skin and the heat source. ?Leave the heat on for 20-30 minutes. ?Remove the heat if your skin turns bright red. This is especially important if you are unable to feel pain, heat, or cold. You may have a greater risk of getting burned. ?General instructions ?If you were given a sedative during the procedure, it can affect you for several hours. Do not drive or operate  machinery until your health care provider says that it is safe. ?For the first 24 hours after the procedure: ?Do not sign important documents. ?Do not drink alcohol. ?Do your regular daily activities at a slower pace than normal. ?Eat soft foods that are easy to digest. ?Take over-the-counter and prescription medicines only as told by your health care provider. ?Keep all follow-up visits as told by your health care provider. This is important. ?Contact a health care provider if: ?You have blood in your stool 2-3 days after the  procedure. ?Get help right away if you have: ?More than a small spotting of blood in your stool. ?Large blood clots in your stool. ?Swelling of your abdomen. ?Nausea or vomiting. ?A fever. ?Increasing pain in your abdomen that is not relieved with medicine. ?Summary ?After the procedure, it is common to have a small amount of blood in your stool. You may also have mild cramping and bloating of your abdomen. ?If you were given a sedative during the procedure, it can affect you for several hours. Do not drive or operate machinery until your health care provider says that it is safe. ?Get help right away if you have a lot of blood in your stool, nausea or vomiting, a fever, or increased pain in your abdomen. ?This information is not intended to replace advice given to you by your health care provider. Make sure you discuss any questions you have with your health care provider. ?Document Revised: 02/07/2019 Document Reviewed: 10/28/2018 ?Elsevier Patient Education ? [place]. ?Monitored Anesthesia Care, Care After ?This sheet gives you information about how to care for yourself after your procedure. Your health care provider may also give you more specific instructions. If you have problems or questions, contact your health care provider. ?What can I expect after the procedure? ?After the procedure, it is common to have: ?Tiredness. ?Forgetfulness about what happened after the procedure. ?Impaired judgment for important decisions. ?Nausea or vomiting. ?Some difficulty with balance. ?Follow these instructions at home: ?For the time period you were told by your health care provider: ?  ?Rest as needed. ?Do not participate in activities where you could fall or become injured. ?Do not drive or use machinery. ?Do not drink alcohol. ?Do not take sleeping pills or medicines that cause drowsiness. ?Do not make important decisions or sign legal documents. ?Do not take care of children on your own. ?Eating and  drinking ?Follow the diet that is recommended by your health care provider. ?Drink enough fluid to keep your urine pale yellow. ?If you vomit: ?Drink water, juice, or soup when you can drink without vomiting. ?Make sure you have little or no nausea before eating solid foods. ?General instructions ?Have a responsible adult stay with you for the time you are told. It is important to have someone help care for you until you are awake and alert. ?Take over-the-counter and prescription medicines only as told by your health care provider. ?If you have sleep apnea, surgery and certain medicines can increase your risk for breathing problems. Follow instructions from your health care provider about wearing your sleep device: ?Anytime you are sleeping, including during daytime naps. ?While taking prescription pain medicines, sleeping medicines, or medicines that make you drowsy. ?Avoid smoking. ?Keep all follow-up visits as told by your health care provider. This is important. ?Contact a health care provider if: ?You keep feeling nauseous or you keep vomiting. ?You feel light-headed. ?You are still sleepy or having trouble with balance after 24 hours. ?You develop  a rash. ?You have a fever. ?You have redness or swelling around the IV site. ?Get help right away if: ?You have trouble breathing. ?You have new-onset confusion at home. ?Summary ?For several hours after your procedure, you may feel tired. You may also be forgetful and have poor judgment. ?Have a responsible adult stay with you for the time you are told. It is important to have someone help care for you until you are awake and alert. ?Rest as told. Do not drive or operate machinery. Do not drink alcohol or take sleeping pills. ?Get help right away if you have trouble breathing, or if you suddenly become confused. ?This information is not intended to replace advice given to you by your health care provider. Make sure you discuss any questions you have with your  health care provider. ?Document Revised: 12/18/2019 Document Reviewed: 03/06/2019 ?Elsevier Patient Education ? [place]. ? ?

## 2021-07-20 ENCOUNTER — Encounter (HOSPITAL_COMMUNITY)
Admission: RE | Admit: 2021-07-20 | Discharge: 2021-07-20 | Disposition: A | Discharge status: Home / Self Care | Payer: 59 | Source: Ambulatory Visit | Attending: Gastroenterology | Admitting: Gastroenterology

## 2021-07-20 DIAGNOSIS — Z0279 Encounter for issue of other medical certificate: Secondary | ICD-10-CM

## 2021-07-20 LAB — JAK2 (INCLUDING V617F AND EXON 12), MPL,& CALR W/RFL MPN PANEL (NGS)

## 2021-07-21 ENCOUNTER — Telehealth: Payer: Self-pay

## 2021-07-21 NOTE — Telephone Encounter (Signed)
FL2 forms ? ?Called patient and left voicemail forms are ready to be pick up. ?

## 2021-07-22 NOTE — Progress Notes (Signed)
I called the patient multiple times but unable to get through.  Voicemail is full and cannot accept any messages. ?This encounter was created in error - please disregard. ?

## 2021-07-25 ENCOUNTER — Encounter (HOSPITAL_COMMUNITY): Payer: Self-pay | Admitting: Anesthesiology

## 2021-07-26 ENCOUNTER — Encounter (HOSPITAL_COMMUNITY): Admission: RE | Payer: Self-pay | Source: Home / Self Care

## 2021-07-26 ENCOUNTER — Ambulatory Visit (HOSPITAL_COMMUNITY): Admission: RE | Admit: 2021-07-26 | Payer: 59 | Source: Home / Self Care | Admitting: Gastroenterology

## 2021-07-26 SURGERY — COLONOSCOPY WITH PROPOFOL
Anesthesia: Monitor Anesthesia Care

## 2021-08-04 ENCOUNTER — Ambulatory Visit: Payer: 59 | Admitting: Internal Medicine

## 2021-08-05 ENCOUNTER — Inpatient Hospital Stay: Payer: 59 | Attending: Physician Assistant | Admitting: Hematology and Oncology

## 2021-08-05 DIAGNOSIS — D473 Essential (hemorrhagic) thrombocythemia: Secondary | ICD-10-CM | POA: Insufficient documentation

## 2021-08-05 DIAGNOSIS — D75839 Thrombocytosis, unspecified: Secondary | ICD-10-CM

## 2021-08-05 NOTE — Assessment & Plan Note (Signed)
08/31/2020: Right leg: DVT involving posterior tibial and right peroneal vein, left leg: Age-indeterminate DVT left gastrocnemius vein ?Repeat ultrasounds 01/12/2021, 05/23/2021, 07/02/2021: Negative for DVT ?? ?Differential diagnosis of thrombocytosis: Primary versus reactive ?Based on the severity of her inflammation and arthritis I suspect that this is reactive in nature. ?Once the knee surgery gets done she is likely to improve. ?I discussed with her that she will need prophylactic anticoagulation postoperatively for the knee surgery. ? ?Lab review: ?07/19/2021: MPN panel: No JAK2 mutation.  Variant of strong clinical significance:CALR ?07/15/2021: Platelets 706, iron studies: TIBC 522, iron saturation 6%, ferritin 54, hemoglobin 12.5, MCV 82.7 ? 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 ? ? ?

## 2021-08-08 ENCOUNTER — Ambulatory Visit (INDEPENDENT_AMBULATORY_CARE_PROVIDER_SITE_OTHER): Payer: 59

## 2021-08-08 DIAGNOSIS — Z111 Encounter for screening for respiratory tuberculosis: Secondary | ICD-10-CM

## 2021-08-08 NOTE — Telephone Encounter (Signed)
Patient picked up forms.

## 2021-08-09 ENCOUNTER — Telehealth: Payer: Self-pay | Admitting: Internal Medicine

## 2021-08-15 ENCOUNTER — Ambulatory Visit (INDEPENDENT_AMBULATORY_CARE_PROVIDER_SITE_OTHER): Payer: 59

## 2021-08-15 DIAGNOSIS — Z111 Encounter for screening for respiratory tuberculosis: Secondary | ICD-10-CM | POA: Diagnosis not present

## 2021-08-18 ENCOUNTER — Ambulatory Visit: Payer: 59 | Admitting: Internal Medicine

## 2021-08-19 ENCOUNTER — Telehealth: Payer: Self-pay | Admitting: Internal Medicine

## 2021-08-19 NOTE — Telephone Encounter (Signed)
Patient called in requesting higher dose of  ? ?pregabalin (LYRICA) 100 MG capsule  ? ?Current dose is not helping  ?

## 2021-08-22 ENCOUNTER — Ambulatory Visit: Payer: 59

## 2021-08-22 ENCOUNTER — Other Ambulatory Visit: Payer: Self-pay | Admitting: Internal Medicine

## 2021-08-22 DIAGNOSIS — I10 Essential (primary) hypertension: Secondary | ICD-10-CM

## 2021-08-23 ENCOUNTER — Telehealth: Payer: Self-pay | Admitting: Pharmacy Technician

## 2021-08-23 ENCOUNTER — Other Ambulatory Visit (HOSPITAL_COMMUNITY): Payer: Self-pay

## 2021-08-23 NOTE — Telephone Encounter (Signed)
Patient Advocate Encounter ? ?Received notification from [facility] that prior authorization for [facility] is required. ?  ?PA NOT NEEDED ?PT INSURANCE COVERS ONE MONTH SUPPLY. COPAY IS $25 ? ?Omar Gayden R Twala Collings, CPhT ?Patient Advocate ?Phone: [phone] ?Fax:  [phone] ? ?

## 2021-08-25 ENCOUNTER — Other Ambulatory Visit: Payer: Self-pay | Admitting: Internal Medicine

## 2021-09-05 ENCOUNTER — Other Ambulatory Visit: Payer: Self-pay | Admitting: *Deleted

## 2021-09-05 MED ORDER — SUCRALFATE 1 G PO TABS: ORAL_TABLET | ORAL | 10 refills | Status: DC

## 2021-09-06 ENCOUNTER — Ambulatory Visit: Payer: 59 | Admitting: Internal Medicine

## 2021-09-06 ENCOUNTER — Ambulatory Visit (INDEPENDENT_AMBULATORY_CARE_PROVIDER_SITE_OTHER): Payer: 59 | Admitting: Internal Medicine

## 2021-09-06 ENCOUNTER — Telehealth: Payer: Self-pay

## 2021-09-06 ENCOUNTER — Encounter: Payer: Self-pay | Admitting: Internal Medicine

## 2021-09-06 VITALS — BP 122/82 | HR 102 | Resp 18 | Ht 59.0 in | Wt 185.2 lb

## 2021-09-06 DIAGNOSIS — Z01818 Encounter for other preprocedural examination: Secondary | ICD-10-CM | POA: Diagnosis not present

## 2021-09-06 DIAGNOSIS — Z86718 Personal history of other venous thrombosis and embolism: Secondary | ICD-10-CM

## 2021-09-06 DIAGNOSIS — F332 Major depressive disorder, recurrent severe without psychotic features: Secondary | ICD-10-CM

## 2021-09-06 DIAGNOSIS — M48062 Spinal stenosis, lumbar region with neurogenic claudication: Secondary | ICD-10-CM | POA: Diagnosis not present

## 2021-09-06 DIAGNOSIS — I10 Essential (primary) hypertension: Secondary | ICD-10-CM

## 2021-09-06 DIAGNOSIS — G609 Hereditary and idiopathic neuropathy, unspecified: Secondary | ICD-10-CM

## 2021-09-06 MED ORDER — PREGABALIN 150 MG PO CAPS: 150.0000 mg | ORAL_CAPSULE | Freq: Two times a day (BID) | ORAL | 5 refills | Status: DC

## 2021-09-06 MED ORDER — TIZANIDINE HCL 4 MG PO TABS: 4.0000 mg | ORAL_TABLET | Freq: Three times a day (TID) | ORAL | 2 refills | Status: DC | PRN

## 2021-09-06 NOTE — Progress Notes (Signed)
Established Patient Office Visit  Subjective:  Patient ID: Kathryn [name], female    DOB: Apr 04, 1971  Age: 51 y.o. MRN: [id number]  CC:  Chief Complaint  Patient presents with   Follow-up    Pt needs referral to foot and ankle for neuropathy needs lyrica increased as this dose doesn't help also needs surgery clearance 10-03-21 for total right knee replacement    HPI Kathryn [name] is a 51 y.o. female with past medical history of hypertension, asthma, GERD, insomnia, mild sleep apnea, depression, chronic low back pain and knee pain, and obesity who presents for f/u of her chronic medical conditions.  Knee OA: She is planned to get right TKA for knee OA.  HTN: BP is well-controlled. Takes medications regularly. Patient denies headache, dizziness, chest pain, dyspnea or palpitations.  Asthma: Well controlled with Wixela and as needed albuterol currently.  She denies any dyspnea or wheezing currently.  She complains of persistent numbness and tingling of the bilateral feet despite taking Lyrica 100 mg twice daily.  Of note, she has history of lumbar spinal stenosis and has had lumbar spine fusion surgery in the past. She also c/o muscle spasms, which are intermittent.  She did tolerate Robaxin.     Past Medical History:  Diagnosis Date   Anxiety    Arthritis    Asthma    Bipolar disorder ([place])    Chronic back pain    COPD (chronic obstructive pulmonary disease) (HCC)    Chronic bronchitis   Depression    GERD (gastroesophageal reflux disease)    Hypertension    Neuropathy    Osteoarthritis of left knee, 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   Single subsegmental pulmonary embolism without acute cor pulmonale ([place]) 06/20/2021   Sleep apnea 04/2020   GETTING A cpap   Suicidal ideation 12/24/2018    Past Surgical History:  Procedure Laterality Date   BACK SURGERY     DILATATION AND CURETTAGE/HYSTEROSCOPY WITH MINERVA N/A 06/09/2020   Procedure: DILATATION AND CURETTAGE/HYSTEROSCOPY WITH  MINERVA;  Surgeon: Florian Buff, MD;  Location: [facility];  Service: Gynecology;  Laterality: N/A;   ESOPHAGOGASTRODUODENOSCOPY (EGD) WITH PROPOFOL N/A 12/25/2018   Procedure: ESOPHAGOGASTRODUODENOSCOPY (EGD) WITH PROPOFOL;  Surgeon: Rogene Houston, MD;  Location: [facility];  Service: Endoscopy;  Laterality: N/A;   PATELLA-FEMORAL ARTHROPLASTY Left 10/08/2018   Procedure: PATELLA-FEMORAL ARTHROPLASTY;  Surgeon: Marchia Bond, MD;  Location: [facility];  Service: Orthopedics;  Laterality: Left;   TUBAL LIGATION      Family History  Problem Relation Age of Onset   Gout Paternal Grandfather    Cirrhosis Paternal Grandfather    Hypertension Paternal Grandmother    Aneurysm Paternal Grandmother    Cirrhosis Maternal Grandmother    Cirrhosis Maternal Grandfather    Cancer Father    Cirrhosis Father    Cirrhosis Mother    Breast cancer Sister    Hypertension Sister    Bronchitis Daughter    Bronchitis Daughter    Asthma Son    Bronchitis Son    Migraines Neg Hx     Social History   Socioeconomic History   Marital status: Single    Spouse name: Not on file   Number of children: Not on file   Years of education: Not on file   Highest education level: Not on file  Occupational History   Not on file  Tobacco Use   Smoking status: Former    Types: Cigarettes    Passive exposure: Current  Smokeless tobacco: Never   Tobacco comments:    Occasional smoker  Vaping Use   Vaping Use: Never used  Substance and Sexual Activity   Alcohol use: Not Currently   Drug use: Not Currently    Types: Cocaine    Comment: crack  last used 2016   Sexual activity: Yes    Birth control/protection: Surgical    Comment: tubal, ablation  Other Topics Concern   Not on file  Social History Narrative   R handed    Lives with boyfriend   1 Cup of caffeine daily    Social Determinants of Health   Financial Resource Strain: Not on file  Food Insecurity: No Food Insecurity   Worried About  Charity fundraiser in the Last Year: Never true   Ran Out of Food in the Last Year: Never true  Transportation Needs: No Transportation Needs   Lack of Transportation (Medical): No   Lack of Transportation (Non-Medical): No  Physical Activity: Not on file  Stress: Not on file  Social Connections: Not on file  Intimate Partner Violence: Not on file    Outpatient Medications Prior to Visit  Medication Sig Dispense Refill   albuterol (PROVENTIL) (2.5 MG/3ML) 0.083% nebulizer solution Take 3 mLs (2.5 mg total) by nebulization every 6 (six) hours as needed for wheezing or shortness of breath. 75 mL 11   albuterol (VENTOLIN HFA) 108 (90 Base) MCG/ACT inhaler Inhale 2 puffs into the lungs every 6 (six) hours as needed for wheezing or shortness of breath. 18 g 5   amLODipine (NORVASC) 5 MG tablet TAKE 1 TABLET BY MOUTH DAILY FOR BLOOD PRESSURE 30 tablet 5   Cholecalciferol (VITAMIN D) 50 MCG (2000 UT) CAPS Take 1 capsule by mouth daily.     citalopram (CELEXA) 20 MG tablet Take 20 mg by mouth daily.     cloNIDine (CATAPRES) 0.1 MG tablet TAKE 1 TABLET BY MOUTH DAILY 30 tablet 5   Cyanocobalamin (VITAMIN B 12 PO) Take 1 tablet by mouth daily.     DULoxetine (CYMBALTA) 30 MG capsule Take 1 capsule (30 mg total) by mouth 2 (two) times daily. 60 capsule 5   fluticasone-salmeterol (WIXELA INHUB) 250-50 MCG/ACT AEPB Inhale 1 puff into the lungs in the morning and at bedtime. 180 each 3   furosemide (LASIX) 20 MG tablet TAKE 1 TABLET BY MOUTH DAILY 30 tablet 10   hydrocortisone (ANUSOL-HC) 2.5 % rectal cream Place 1 application rectally 2 (two) times daily. 30 g 0   hydrOXYzine (VISTARIL) 50 MG capsule Take 1 capsule (50 mg total) by mouth daily as needed for anxiety (Insomnia). (Patient taking differently: Take 50 mg by mouth daily.) 30 capsule 5   Misc. Devices MISC Rolling walker - ICD10: M48.061, Z98.1, M17.9 1 each 0   omeprazole (PRILOSEC) 20 MG capsule Take 1 capsule (20 mg total) by mouth daily.  90 capsule 3   ondansetron (ZOFRAN-ODT) 4 MG disintegrating tablet Take 1 tablet (4 mg total) by mouth every 8 (eight) hours as needed for up to 10 doses for nausea or vomiting. 10 tablet 0   potassium chloride SA (KLOR-CON M) 20 MEQ tablet Take 1 tablet (20 mEq total) by mouth daily. TAKE (1) TABLET BY MOUTH ONCE A DAY 30 tablet 1   Respiratory Therapy Supplies (NEBULIZER/TUBING/MOUTHPIECE) KIT Face mask and tubing for nebulizer machine 1 kit 0   risperiDONE (RISPERDAL) 0.5 MG tablet Take 0.5 mg by mouth 2 (two) times daily.     sucralfate (  CARAFATE) 1 g tablet TAKE 1 TABLET BY MOUTH FOUR TIMES DAILY WITH MEALS AND AT BEDTIME 120 tablet 10   traZODone (DESYREL) 100 MG tablet Take 100 mg by mouth at bedtime.     diazepam (VALIUM) 5 MG tablet Take 1 tablet (5 mg total) by mouth at bedtime as needed for anxiety. 30 tablet 0   ELIQUIS 5 MG TABS tablet TAKE ONE (1) TABLET BY MOUTH TWICE DAILY 60 tablet 10   methocarbamol (ROBAXIN) 500 MG tablet TAKE ONE (1) TABLET BY MOUTH TWICE DAILY 20 tablet 10   oxyCODONE (ROXICODONE) 5 MG immediate release tablet Take 1 tablet (5 mg total) by mouth every 8 (eight) hours as needed for up to 10 doses for severe pain. 10 tablet 0   pregabalin (LYRICA) 100 MG capsule Take 1 capsule (100 mg total) by mouth 2 (two) times daily. 60 capsule 2   No facility-administered medications prior to visit.    Allergies  Allergen Reactions   Clonopin [Clonazepam] Anaphylaxis    Swelling of the tongue and mouth.    Fish Allergy Anaphylaxis, Shortness Of Breath and Swelling   Flexeril [Cyclobenzaprine Hcl] Shortness Of Breath   Ibuprofen Anaphylaxis, Hives and Other (See Comments)   Shellfish Allergy Anaphylaxis   Tylenol [Acetaminophen] Anaphylaxis   Ace Inhibitors Cough   Cyclobenzaprine    Tramadol Nausea And Vomiting    Upset stomach   Trazodone And Nefazodone Hives    ROS Review of Systems  Constitutional:  Negative for chills and fever.  HENT:  Negative for  congestion, sinus pressure, sinus pain and sore throat.   Eyes:  Negative for pain and discharge.  Respiratory:  Negative for cough and shortness of breath.   Cardiovascular:  Negative for chest pain and palpitations.  Gastrointestinal:  Negative for abdominal pain, constipation, diarrhea, nausea and vomiting.  Endocrine: Negative for polydipsia and polyuria.  Genitourinary:  Positive for pelvic pain. Negative for dysuria, hematuria, menstrual problem, vaginal bleeding and vaginal discharge.  Musculoskeletal:  Positive for arthralgias and back pain. Negative for neck pain and neck stiffness.  Skin:  Negative for rash.  Neurological:  Negative for dizziness and weakness.  Psychiatric/Behavioral:  Positive for dysphoric mood and sleep disturbance. Negative for agitation and behavioral problems.      Objective:    Physical Exam Vitals reviewed.  Constitutional:      General: She is not in acute distress.    Appearance: She is obese. She is not diaphoretic.  HENT:     Head: Normocephalic and atraumatic.     Nose: Nose normal.     Mouth/Throat:     Mouth: Mucous membranes are moist.  Eyes:     General: No scleral icterus.    Extraocular Movements: Extraocular movements intact.  Cardiovascular:     Rate and Rhythm: Normal rate and regular rhythm.     Pulses: Normal pulses.     Heart sounds: Normal heart sounds. No murmur heard. Pulmonary:     Breath sounds: Normal breath sounds. No wheezing or rales.  Abdominal:     Palpations: Abdomen is soft.     Tenderness: There is no abdominal tenderness. There is no guarding or rebound.  Musculoskeletal:        General: Tenderness (Lumbar spine area) present.     Cervical back: Neck supple. No tenderness.     Right lower leg: No edema.     Left lower leg: No edema.  Skin:    General: Skin is warm.  Findings: No rash.  Neurological:     General: No focal deficit present.     Mental Status: She is alert and oriented to person, place,  and time.     Sensory: No sensory deficit.     Motor: Weakness (B/l LE (chronic)) present.  Psychiatric:        Mood and Affect: Mood normal.        Behavior: Behavior normal.    BP 122/82 (BP Location: Right Arm, Patient Position: Sitting, Cuff Size: Normal)   Pulse (!) 102   Resp 18   Ht '4\' 11"'  (1.499 m)   Wt 185 lb 3.2 oz (84 kg)   LMP 06/07/2020   SpO2 95%   BMI 37.41 kg/m  Wt Readings from Last 3 Encounters:  09/06/21 185 lb 3.2 oz (84 kg)  07/15/21 183 lb 11.2 oz (83.3 kg)  07/04/21 188 lb 12.8 oz (85.6 kg)    Lab Results  Component Value Date   TSH 1.533 12/24/2018   Lab Results  Component Value Date   WBC 8.2 07/15/2021   HGB 12.5 07/15/2021   HCT 37.8 07/15/2021   MCV 82.7 07/15/2021   PLT 706 (H) 07/15/2021   Lab Results  Component Value Date   NA 140 07/02/2021   K 3.6 07/02/2021   CO2 25 07/02/2021   GLUCOSE 93 07/02/2021   BUN 14 07/02/2021   CREATININE 0.82 07/02/2021   BILITOT 0.2 (L) 07/02/2021   ALKPHOS 82 07/02/2021   AST 35 07/02/2021   ALT 33 07/02/2021   PROT 7.0 07/02/2021   ALBUMIN 3.6 07/02/2021   CALCIUM 9.0 07/02/2021   ANIONGAP 8 07/02/2021   Lab Results  Component Value Date   CHOL 155 04/20/2017   Lab Results  Component Value Date   HDL 70 04/20/2017   Lab Results  Component Value Date   LDLCALC 72 04/20/2017   Lab Results  Component Value Date   TRIG 46 04/20/2017   Lab Results  Component Value Date   CHOLHDL 2.2 04/20/2017   Lab Results  Component Value Date   HGBA1C 5.4 04/20/2017      Assessment & Plan:   Problem List Items Addressed This Visit       Cardiovascular and Mediastinum   Hypertension - Primary    BP Readings from Last 1 Encounters:  09/06/21 122/82  Well-controlled overall with Amlodipine, Clonidine and Lasix Would prefer to switch to ARB, will discuss after her surgery Counseled for compliance with the medications Advised DASH diet and moderate exercise/walking, at least 150  mins/week        Nervous and Auditory   Idiopathic peripheral neuropathy    Numbness and tingling of the LE, has some component of peripheral neuropathy Also has history of lumbar spinal stenosis Uncontrolled with Lyrica 100 mg twice daily, increased dose of Lyrica to 150 mg twice daily       Relevant Medications   pregabalin (LYRICA) 150 MG capsule   tiZANidine (ZANAFLEX) 4 MG tablet   Other Relevant Orders   Ambulatory referral to Neurology     Other   Lumbar spinal stenosis    H/o chronic low back pain S/p lumbar fusion surgery - L3-L4 F/u with Spine surgery Increased dose of Lyrica to 150 mg BID       Relevant Medications   pregabalin (LYRICA) 150 MG capsule   tiZANidine (ZANAFLEX) 4 MG tablet   MDD (major depressive disorder), recurrent episode, severe ([place])    [place]  Visit from 09/06/2021 in [facility]  PHQ-9 Total Score 0  On Cymbalta, gets Celexa from [facility] Has severe anxiety, refilled Vistaril      History of DVT of lower extremity    Isolated episode, has completed Eliquis x 6 months Has been evaluated by Hematology        Morbid obesity ([place])    BMI Readings from Last 3 Encounters:  09/06/21 37.41 kg/m  07/15/21 37.10 kg/m  07/04/21 38.13 kg/m  With HTN, HLD, MDD and OSA Diet modification and moderate exercise advised      Preoperative examination    BP usually WNL EKG in 10/22: Sinus rhythm. Pathological q wave in lead III, noncontiguous. No signs of active ischemia.  Medically optimized with moderate risk for TKA.        Meds ordered this encounter  Medications   pregabalin (LYRICA) 150 MG capsule    Sig: Take 1 capsule (150 mg total) by mouth 2 (two) times daily.    Dispense:  60 capsule    Refill:  5   tiZANidine (ZANAFLEX) 4 MG tablet    Sig: Take 1 tablet (4 mg total) by mouth every 8 (eight) hours as needed for muscle spasms.    Dispense:  30 tablet    Refill:  2    Follow-up: Return  in about 7 months (around 03/27/2022) for Annual physical (after 12/08).    Lindell Spar, MD

## 2021-09-06 NOTE — Patient Instructions (Signed)
Please take Tizanidine as needed for muscle spasms.  Please start taking Lyrica 150 mg twice daily instead of 100 mg.  Please continue taking other medications as prescribed.

## 2021-09-06 NOTE — Telephone Encounter (Signed)
Medical clearance form from [facility] Right Knee Replacement  Copied Sleeved Noted

## 2021-09-06 NOTE — Assessment & Plan Note (Signed)
BP usually WNL EKG in 10/22: Sinus rhythm. Pathological q wave in lead III, noncontiguous. No signs of active ischemia.  Medically optimized with moderate risk for TKA.

## 2021-09-06 NOTE — Assessment & Plan Note (Signed)
BP Readings from Last 1 Encounters:  09/06/21 122/82   Well-controlled overall with Amlodipine, Clonidine and Lasix Would prefer to switch to ARB, will discuss after her surgery Counseled for compliance with the medications Advised DASH diet and moderate exercise/walking, at least 150 mins/week

## 2021-09-06 NOTE — Assessment & Plan Note (Signed)
Numbness and tingling of the LE, has some component of peripheral neuropathy Also has history of lumbar spinal stenosis Uncontrolled with Lyrica 100 mg twice daily, increased dose of Lyrica to 150 mg twice daily

## 2021-09-06 NOTE — Assessment & Plan Note (Signed)
BMI Readings from Last 3 Encounters:  09/06/21 37.41 kg/m  07/15/21 37.10 kg/m  07/04/21 38.13 kg/m   With HTN, HLD, MDD and OSA Diet modification and moderate exercise advised

## 2021-09-06 NOTE — Assessment & Plan Note (Signed)
H/o chronic low back pain S/p lumbar fusion surgery - L3-L4 F/u with Spine surgery Increased dose of Lyrica to 150 mg BID

## 2021-09-06 NOTE — Assessment & Plan Note (Signed)
Isolated episode, has completed Eliquis x 6 months Has been evaluated by Hematology

## 2021-09-06 NOTE — Assessment & Plan Note (Signed)
[place] Office Visit from 09/06/2021 in [facility]  PHQ-9 Total Score 0     On Cymbalta, gets Celexa from [facility] Has severe anxiety, refilled Vistaril

## 2021-09-07 ENCOUNTER — Ambulatory Visit (INDEPENDENT_AMBULATORY_CARE_PROVIDER_SITE_OTHER): Payer: 59

## 2021-09-07 ENCOUNTER — Telehealth: Payer: Self-pay

## 2021-09-07 DIAGNOSIS — Z111 Encounter for screening for respiratory tuberculosis: Secondary | ICD-10-CM

## 2021-09-07 NOTE — Telephone Encounter (Signed)
FL2 Form  ([employer] / Clayton Lefort (owner) [place] # [phone] and mobile [phone].  Copied Noted sleeved

## 2021-09-07 NOTE — Progress Notes (Signed)
PPD placed in right arm. Will return Friday for reading

## 2021-09-09 ENCOUNTER — Telehealth: Payer: Self-pay

## 2021-09-09 DIAGNOSIS — Z0279 Encounter for issue of other medical certificate: Secondary | ICD-10-CM

## 2021-09-09 LAB — TB SKIN TEST
Induration: 0 mm
TB Skin Test: NEGATIVE

## 2021-09-09 NOTE — Telephone Encounter (Signed)
Forms faxed to [facility]alie[facility] [employer]p [phone]. called patient to let patient know copy to be picked up and faxed. Vm not setup. Forms put in folder for patient to pick up.

## 2021-09-09 NOTE — H&P (Signed)
KNEE ARTHROPLASTY ADMISSION H&P  Patient ID: FOLASHADE GAMBOA MRN: [id number] DOB/AGE: 12/01/1970 51 y.o.  Chief Complaint: {Left/right:3049041} knee pain.  Planned Procedure Date: *** Medical Clearance by ***   Cardiac Clearance by *** Additional clearance by ***   HPI: KARSTEN VAUGHN is a 51 y.o. female who presents for evaluation of OA RIGHT KNEE. The patient has a history of pain and functional disability in the {Left/right:3049041} knee due to arthritis and has failed non-surgical conservative treatments for greater than 12 weeks to include {nonsurgical conservative treatment (must select two):3049030}.  Onset of symptoms was {abrupt, gradual:20671}, starting {1->10 years:3049031} years ago with {stable, gradual worsening, rapidly worsening:3049032} course since that time. The patient noted {no past surgery, prior procedures:3049033} on the {Left/right:3049041} knee.  Patient currently rates pain at {1-10:3049035} out of 10 with activity. Patient has {night pain, worsening of pain with activity and weight bearing:3049036}.  Patient has evidence of {Radiographic or MRI evidence of (must document one of the below):3046104} by imaging studies.  There is no active infection.  Past Medical History:  Diagnosis Date   Anxiety    Arthritis    Asthma    Bipolar disorder ([place])    Chronic back pain    COPD (chronic obstructive pulmonary disease) (HCC)    Chronic bronchitis   Depression    GERD (gastroesophageal reflux disease)    Hypertension    Neuropathy    Osteoarthritis of left knee, 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   Single subsegmental pulmonary embolism without acute cor pulmonale ([place]) 06/20/2021   Sleep apnea 04/2020   GETTING A cpap   Suicidal ideation 12/24/2018   Past Surgical History:  Procedure Laterality Date   BACK SURGERY     DILATATION AND CURETTAGE/HYSTEROSCOPY WITH MINERVA N/A 06/09/2020   Procedure: DILATATION AND CURETTAGE/HYSTEROSCOPY WITH MINERVA;  Surgeon: Florian Buff,  MD;  Location: [facility];  Service: Gynecology;  Laterality: N/A;   ESOPHAGOGASTRODUODENOSCOPY (EGD) WITH PROPOFOL N/A 12/25/2018   Procedure: ESOPHAGOGASTRODUODENOSCOPY (EGD) WITH PROPOFOL;  Surgeon: Rogene Houston, MD;  Location: [facility];  Service: Endoscopy;  Laterality: N/A;   PATELLA-FEMORAL ARTHROPLASTY Left 10/08/2018   Procedure: PATELLA-FEMORAL ARTHROPLASTY;  Surgeon: Marchia Bond, MD;  Location: [facility];  Service: Orthopedics;  Laterality: Left;   TUBAL LIGATION     Allergies  Allergen Reactions   Clonopin [Clonazepam] Anaphylaxis    Swelling of the tongue and mouth.    Fish Allergy Anaphylaxis, Shortness Of Breath and Swelling   Flexeril [Cyclobenzaprine Hcl] Shortness Of Breath   Ibuprofen Anaphylaxis, Hives and Other (See Comments)   Shellfish Allergy Anaphylaxis   Tylenol [Acetaminophen] Anaphylaxis   Ace Inhibitors Cough   Cyclobenzaprine    Tramadol Nausea And Vomiting    Upset stomach   Trazodone And Nefazodone Hives   Prior to Admission medications   Medication Sig Start Date End Date Taking? Authorizing Provider  albuterol (PROVENTIL) (2.5 MG/3ML) 0.083% nebulizer solution Take 3 mLs (2.5 mg total) by nebulization every 6 (six) hours as needed for wheezing or shortness of breath. 07/04/21   Hunsucker, Bonna Gains, MD  albuterol (VENTOLIN HFA) 108 (90 Base) MCG/ACT inhaler Inhale 2 puffs into the lungs every 6 (six) hours as needed for wheezing or shortness of breath. 07/04/21   Hunsucker, Bonna Gains, MD  amLODipine (NORVASC) 5 MG tablet TAKE 1 TABLET BY MOUTH DAILY FOR BLOOD PRESSURE 08/23/21   Lindell Spar, MD  Cholecalciferol (VITAMIN D) 50 MCG (2000 UT) CAPS Take 1 capsule by mouth daily.  [provider]  citalopram (CELEXA) 20 MG tablet Take 20 mg by mouth daily. 05/16/21   [provider]  cloNIDine (CATAPRES) 0.1 MG tablet TAKE 1 TABLET BY MOUTH DAILY 08/23/21   Lindell Spar, MD  Cyanocobalamin (VITAMIN B 12 PO) Take 1 tablet by mouth  daily.    [provider]  DULoxetine (CYMBALTA) 30 MG capsule Take 1 capsule (30 mg total) by mouth 2 (two) times daily. 03/24/21   Lindell Spar, MD  fluticasone-salmeterol [facility] INHUB) 250-50 MCG/ACT AEPB Inhale 1 puff into the lungs in the morning and at bedtime. 07/04/21   Hunsucker, Bonna Gains, MD  furosemide (LASIX) 20 MG tablet TAKE 1 TABLET BY MOUTH DAILY 07/05/21   Lindell Spar, MD  hydrocortisone (ANUSOL-HC) 2.5 % rectal cream Place 1 application rectally 2 (two) times daily. 11/18/20   Lindell Spar, MD  hydrOXYzine (VISTARIL) 50 MG capsule Take 1 capsule (50 mg total) by mouth daily as needed for anxiety (Insomnia). Patient taking differently: Take 50 mg by mouth daily. 03/24/21   Lindell Spar, MD  Misc. Devices MISC Rolling walker - ICD10: M48.061, Z98.1, M17.9 03/24/21   Lindell Spar, MD  omeprazole (PRILOSEC) 20 MG capsule Take 1 capsule (20 mg total) by mouth daily. 07/04/21   Lindell Spar, MD  ondansetron (ZOFRAN-ODT) 4 MG disintegrating tablet Take 1 tablet (4 mg total) by mouth every 8 (eight) hours as needed for up to 10 doses for nausea or vomiting. 06/10/21   Wyvonnia Dusky, MD  potassium chloride SA (KLOR-CON M) 20 MEQ tablet Take 1 tablet (20 mEq total) by mouth daily. TAKE (1) TABLET BY MOUTH ONCE A DAY 07/04/21   Lindell Spar, MD  pregabalin (LYRICA) 150 MG capsule Take 1 capsule (150 mg total) by mouth 2 (two) times daily. 09/06/21   Lindell Spar, MD  Respiratory Therapy Supplies (NEBULIZER/TUBING/MOUTHPIECE) KIT Face mask and tubing for nebulizer machine 07/06/21   Lindell Spar, MD  risperiDONE (RISPERDAL) 0.5 MG tablet Take 0.5 mg by mouth 2 (two) times daily. 05/16/21   [provider]  sucralfate (CARAFATE) 1 g tablet TAKE 1 TABLET BY MOUTH FOUR TIMES DAILY WITH MEALS AND AT BEDTIME 09/05/21   Lindell Spar, MD  tiZANidine (ZANAFLEX) 4 MG tablet Take 1 tablet (4 mg total) by mouth every 8 (eight) hours as needed for muscle spasms.  09/06/21   Lindell Spar, MD  traZODone (DESYREL) 100 MG tablet Take 100 mg by mouth at bedtime. 05/16/21   [provider]   Social History   Socioeconomic History   Marital status: Single    Spouse name: Not on file   Number of children: Not on file   Years of education: Not on file   Highest education level: Not on file  Occupational History   Not on file  Tobacco Use   Smoking status: Former    Types: Cigarettes    Passive exposure: Current   Smokeless tobacco: Never   Tobacco comments:    Occasional smoker  Vaping Use   Vaping Use: Never used  Substance and Sexual Activity   Alcohol use: Not Currently   Drug use: Not Currently    Types: Cocaine    Comment: crack  last used 2016   Sexual activity: Yes    Birth control/protection: Surgical    Comment: tubal, ablation  Other Topics Concern   Not on file  Social History Narrative   R handed  Lives with boyfriend   1 Cup of caffeine daily    Social Determinants of Health   Financial Resource Strain: Not on file  Food Insecurity: No Food Insecurity   Worried About Charity fundraiser in the Last Year: Never true   Ran Out of Food in the Last Year: Never true  Transportation Needs: No Transportation Needs   Lack of Transportation (Medical): No   Lack of Transportation (Non-Medical): No  Physical Activity: Not on file  Stress: Not on file  Social Connections: Not on file   Family History  Problem Relation Age of Onset   Gout Paternal Grandfather    Cirrhosis Paternal Grandfather    Hypertension Paternal Grandmother    Aneurysm Paternal Grandmother    Cirrhosis Maternal Grandmother    Cirrhosis Maternal Grandfather    Cancer Father    Cirrhosis Father    Cirrhosis Mother    Breast cancer Sister    Hypertension Sister    Bronchitis Daughter    Bronchitis Daughter    Asthma Son    Bronchitis Son    Migraines Neg Hx     ROS: Currently denies lightheadedness, dizziness, Fever, chills, CP,  SOB.***   No personal history of DVT, PE, MI, or CVA.*** No loose teeth or dentures*** All other systems have been reviewed and were otherwise currently negative with the exception of those mentioned in the HPI and as above.  Objective: Vitals: Ht: *** Wt: *** lbs Temp: *** BP: *** Pulse: *** O2 ***% on room air.   Physical Exam: General: Alert, NAD.  Antalgic Gait *** HEENT: EOMI, Good Neck Extension *** Pulm: No increased work of breathing.  Clear B/L A/P w/o crackle or wheeze. *** CV: RRR, No m/g/r appreciated *** GI: soft, NT, ND. BS x 4 quadrants Neuro: CN II-XII grossly intact without focal deficit.  Sensation intact distally Skin: No lesions in the area of chief complaint MSK/Surgical Site: *** knee w/o redness or effusion.  *** JLT. ROM ***.  5/5 strength in extension and flexion.  +EHL/FHL.  NVI.  Stable varus and valgus stress.    Imaging Review Plain radiographs demonstrate {mild/mod/severe:3049053} degenerative joint disease of the {left/right/bi:30031} knee.   The overall alignment is{Neutral/varus:3049054}. The bone quality appears to be {good/fair/poor/excellent:33178} for age and reported activity level.  Preoperative templating of the joint replacement has been completed, documented, and submitted to the Operating Room personnel in order to optimize intra-operative equipment management.  Assessment: OA RIGHT KNEE Active Problems:   * No active hospital problems. *   Plan: Plan for Procedure(s): TOTAL KNEE ARTHROPLASTY  The patient history, physical exam, clinical judgement of the provider and imaging are consistent with end stage degenerative joint disease and *** joint arthroplasty is deemed medically necessary. The treatment options including medical management, injection therapy, and arthroplasty were discussed at length. The risks and benefits of Procedure(s): TOTAL KNEE ARTHROPLASTY were presented and reviewed.  The risks of nonoperative treatment, versus  surgical intervention including but not limited to continued pain, aseptic loosening, stiffness, dislocation/subluxation, infection, bleeding, nerve injury, blood clots, cardiopulmonary complications, morbidity, mortality, among others were discussed. The patient verbalizes understanding and wishes to proceed with the plan.  Patient is being admitted for inpatient treatment for surgery, pain control, PT, prophylactic antibiotics, VTE prophylaxis, progressive ambulation, ADL's and discharge planning.   Dental prophylaxis discussed and recommended for 2 years postoperatively.***  The patient does not*** meet the criteria for TXA which will be used perioperatively.   ASA 81 mg  BID ***ASA 325 mg ***Xarelto  will be used postoperatively for DVT prophylaxis in addition to SCDs, and early ambulation. Plan for ***Tylenol, Celebrex, oxycodone for pain.   Robaxin***Baclofen for muscle spasms.   Zofran for nausea and vomiting. Omeprazole for gastric protection***. Pharmacy- *** The patient is planning to be discharged home with OPPT*** HHPT*** ([facility]***) and into the care of *** who can be reached at *** Follow up appt ***  {ORTHOADMISSIONSTATUS:21269}    Alisa Graff Office [phone] 09/09/2021 4:56 PM

## 2021-09-09 NOTE — Telephone Encounter (Signed)
Pt presented in office wanting a referral to for behavioral health. Please advise.

## 2021-09-13 ENCOUNTER — Other Ambulatory Visit: Payer: Self-pay | Admitting: *Deleted

## 2021-09-13 DIAGNOSIS — F332 Major depressive disorder, recurrent severe without psychotic features: Secondary | ICD-10-CM

## 2021-09-13 NOTE — Telephone Encounter (Signed)
Erroneous entry

## 2021-09-13 NOTE — Telephone Encounter (Signed)
Referral placed.

## 2021-09-14 ENCOUNTER — Emergency Department (HOSPITAL_COMMUNITY)
Admission: EM | Admit: 2021-09-14 | Discharge: 2021-09-14 | Disposition: A | Discharge status: Home / Self Care | Payer: 59 | Attending: Emergency Medicine | Admitting: Emergency Medicine

## 2021-09-14 ENCOUNTER — Encounter (HOSPITAL_COMMUNITY): Payer: Self-pay | Admitting: *Deleted

## 2021-09-14 ENCOUNTER — Emergency Department (HOSPITAL_COMMUNITY): Payer: 59

## 2021-09-14 ENCOUNTER — Ambulatory Visit: Payer: 59 | Admitting: Internal Medicine

## 2021-09-14 ENCOUNTER — Other Ambulatory Visit: Payer: Self-pay

## 2021-09-14 DIAGNOSIS — Z7951 Long term (current) use of inhaled steroids: Secondary | ICD-10-CM | POA: Diagnosis not present

## 2021-09-14 DIAGNOSIS — I10 Essential (primary) hypertension: Secondary | ICD-10-CM | POA: Insufficient documentation

## 2021-09-14 DIAGNOSIS — J45909 Unspecified asthma, uncomplicated: Secondary | ICD-10-CM | POA: Insufficient documentation

## 2021-09-14 DIAGNOSIS — Z79899 Other long term (current) drug therapy: Secondary | ICD-10-CM | POA: Diagnosis not present

## 2021-09-14 DIAGNOSIS — D649 Anemia, unspecified: Secondary | ICD-10-CM | POA: Insufficient documentation

## 2021-09-14 DIAGNOSIS — M8700 Idiopathic aseptic necrosis of unspecified bone: Secondary | ICD-10-CM | POA: Diagnosis not present

## 2021-09-14 DIAGNOSIS — R1031 Right lower quadrant pain: Secondary | ICD-10-CM | POA: Diagnosis present

## 2021-09-14 LAB — URINALYSIS, ROUTINE W REFLEX MICROSCOPIC
Bilirubin Urine: NEGATIVE
Glucose, UA: NEGATIVE mg/dL
Hgb urine dipstick: NEGATIVE
Ketones, ur: NEGATIVE mg/dL
Nitrite: NEGATIVE
Protein, ur: NEGATIVE mg/dL
Specific Gravity, Urine: 1.044 — ABNORMAL HIGH (ref 1.005–1.030)
pH: 6 (ref 5.0–8.0)

## 2021-09-14 LAB — COMPREHENSIVE METABOLIC PANEL
ALT: 18 U/L (ref 0–44)
AST: 18 U/L (ref 15–41)
Albumin: 3.6 g/dL (ref 3.5–5.0)
Alkaline Phosphatase: 90 U/L (ref 38–126)
Anion gap: 5 (ref 5–15)
BUN: 8 mg/dL (ref 6–20)
CO2: 25 mmol/L (ref 22–32)
Calcium: 8.9 mg/dL (ref 8.9–10.3)
Chloride: 108 mmol/L (ref 98–111)
Creatinine, Ser: 0.57 mg/dL (ref 0.44–1.00)
GFR, Estimated: >60 mL/min (ref >60)
Glucose, Bld: 93 mg/dL (ref 70–99)
Potassium: 3.7 mmol/L (ref 3.5–5.1)
Sodium: 138 mmol/L (ref 135–145)
Total Bilirubin: 0.5 mg/dL (ref 0.3–1.2)
Total Protein: 7.3 g/dL (ref 6.5–8.1)

## 2021-09-14 LAB — CBC
HCT: 36.7 % (ref 36.0–46.0)
Hemoglobin: 11.9 g/dL — ABNORMAL LOW (ref 12.0–15.0)
MCH: 27.9 pg (ref 26.0–34.0)
MCHC: 32.4 g/dL (ref 30.0–36.0)
MCV: 86.2 fL (ref 80.0–100.0)
Platelets: 680 10*3/uL — ABNORMAL HIGH (ref 150–400)
RBC: 4.26 MIL/uL (ref 3.87–5.11)
RDW: 17.6 % — ABNORMAL HIGH (ref 11.5–15.5)
WBC: 7.2 10*3/uL (ref 4.0–10.5)
nRBC: 0 % (ref 0.0–0.2)

## 2021-09-14 LAB — LIPASE, BLOOD: Lipase: 29 U/L (ref 11–51)

## 2021-09-14 MED ORDER — ONDANSETRON HCL 4 MG/2ML IJ SOLN
4.0000 mg | Freq: Once | INTRAMUSCULAR | Status: AC
Start: 2021-09-14 — End: 2021-09-14
  Administered 2021-09-14: 4 mg via INTRAVENOUS
  Filled 2021-09-14: qty 2

## 2021-09-14 MED ORDER — SODIUM CHLORIDE 0.9 % IV BOLUS
1000.0000 mL | Freq: Once | INTRAVENOUS | Status: AC
  Administered 2021-09-14: 1000 mL via INTRAVENOUS

## 2021-09-14 MED ORDER — HYDROMORPHONE HCL 1 MG/ML IJ SOLN
1.0000 mg | Freq: Once | INTRAMUSCULAR | Status: AC
  Administered 2021-09-14: 1 mg via INTRAVENOUS
  Filled 2021-09-14: qty 1

## 2021-09-14 MED ORDER — HYDROMORPHONE HCL 1 MG/ML IJ SOLN
0.5000 mg | Freq: Once | INTRAMUSCULAR | Status: AC
  Administered 2021-09-14: 0.5 mg via INTRAVENOUS
  Filled 2021-09-14: qty 0.5

## 2021-09-14 MED ORDER — IOHEXOL 300 MG/ML  SOLN
100.0000 mL | Freq: Once | INTRAMUSCULAR | Status: AC | PRN
  Administered 2021-09-14: 100 mL via INTRAVENOUS

## 2021-09-14 NOTE — ED Notes (Signed)
Pt was notified about needing a urine specimen

## 2021-09-14 NOTE — Discharge Instructions (Addendum)
Ovarian cysts-this is seen on your CT scan today, likely resolve on its own, please continue to take over-the-counter pain medication ibuprofen Tylenol as needed may follow with your PCP. 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  Come back to the emergency department if you develop chest pain, shortness of breath, severe abdominal pain, uncontrolled nausea, vomiting, diarrhea.

## 2021-09-14 NOTE — ED Provider Notes (Signed)
[facility] EMERGENCY DEPARTMENT Provider Note   CSN: [id number] Arrival date & time: 09/14/21  1000     History  Chief Complaint  Patient presents with   Abdominal Pain    Kathryn [name] is a 51 y.o. female.  HPI  With medical history including hypertension, asthma, chronic back pain, GERD, anxiety presents  with complaints of right lower quadrant pain.  Pain started at 1 week ago, pain is constant, pain will radiate up into her right back, she has associated nausea without vomiting, she endorses urinary frequency without dysuria hematuria denies vaginal discharge, endorse that she had slight vaginal bleeding it has been spotting but denies any pelvic pain she had a bowel movement earlier today, denies melena or hematochezia, she has no history of of ovarian torsion's or ovarian cyst, she has had a tubal ligation.  She has no significant abdominal history denies history of kidney stones diverticulitis alcohol use NSAID use.  She has no other complaints.    Home Medications Prior to Admission medications   Medication Sig Start Date End Date Taking? Authorizing Provider  albuterol (PROVENTIL) (2.5 MG/3ML) 0.083% nebulizer solution Take 3 mLs (2.5 mg total) by nebulization every 6 (six) hours as needed for wheezing or shortness of breath. 07/04/21   Hunsucker, Bonna Gains, MD  albuterol (VENTOLIN HFA) 108 (90 Base) MCG/ACT inhaler Inhale 2 puffs into the lungs every 6 (six) hours as needed for wheezing or shortness of breath. 07/04/21   Hunsucker, Bonna Gains, MD  amLODipine (NORVASC) 5 MG tablet TAKE 1 TABLET BY MOUTH DAILY FOR BLOOD PRESSURE 08/23/21   Lindell Spar, MD  Cholecalciferol (VITAMIN D) 50 MCG (2000 UT) CAPS Take 1 capsule by mouth daily.    [provider]  citalopram (CELEXA) 20 MG tablet Take 20 mg by mouth daily. 05/16/21   [provider]  cloNIDine (CATAPRES) 0.1 MG tablet TAKE 1 TABLET BY MOUTH DAILY 08/23/21   Lindell Spar, MD  Cyanocobalamin (VITAMIN  B 12 PO) Take 1 tablet by mouth daily.    [provider]  DULoxetine (CYMBALTA) 30 MG capsule Take 1 capsule (30 mg total) by mouth 2 (two) times daily. 03/24/21   Lindell Spar, MD  fluticasone-salmeterol [facility] INHUB) 250-50 MCG/ACT AEPB Inhale 1 puff into the lungs in the morning and at bedtime. 07/04/21   Hunsucker, Bonna Gains, MD  furosemide (LASIX) 20 MG tablet TAKE 1 TABLET BY MOUTH DAILY 07/05/21   Lindell Spar, MD  hydrocortisone (ANUSOL-HC) 2.5 % rectal cream Place 1 application rectally 2 (two) times daily. 11/18/20   Lindell Spar, MD  hydrOXYzine (VISTARIL) 50 MG capsule Take 1 capsule (50 mg total) by mouth daily as needed for anxiety (Insomnia). Patient taking differently: Take 50 mg by mouth daily. 03/24/21   Lindell Spar, MD  Misc. Devices MISC Rolling walker - ICD10: M48.061, Z98.1, M17.9 03/24/21   Lindell Spar, MD  omeprazole (PRILOSEC) 20 MG capsule Take 1 capsule (20 mg total) by mouth daily. 07/04/21   Lindell Spar, MD  ondansetron (ZOFRAN-ODT) 4 MG disintegrating tablet Take 1 tablet (4 mg total) by mouth every 8 (eight) hours as needed for up to 10 doses for nausea or vomiting. 06/10/21   Wyvonnia Dusky, MD  potassium chloride SA (KLOR-CON M) 20 MEQ tablet Take 1 tablet (20 mEq total) by mouth daily. TAKE (1) TABLET BY MOUTH ONCE A DAY 07/04/21   Lindell Spar, MD  pregabalin (LYRICA) 150 MG capsule  Take 1 capsule (150 mg total) by mouth 2 (two) times daily. 09/06/21   Lindell Spar, MD  Respiratory Therapy Supplies (NEBULIZER/TUBING/MOUTHPIECE) KIT Face mask and tubing for nebulizer machine 07/06/21   Lindell Spar, MD  risperiDONE (RISPERDAL) 0.5 MG tablet Take 0.5 mg by mouth 2 (two) times daily. 05/16/21   [provider]  sucralfate (CARAFATE) 1 g tablet TAKE 1 TABLET BY MOUTH FOUR TIMES DAILY WITH MEALS AND AT BEDTIME 09/05/21   Lindell Spar, MD  tiZANidine (ZANAFLEX) 4 MG tablet Take 1 tablet (4 mg total) by mouth every 8 (eight) hours  as needed for muscle spasms. 09/06/21   Lindell Spar, MD  traZODone (DESYREL) 100 MG tablet Take 100 mg by mouth at bedtime. 05/16/21   [provider]      Allergies    Clonopin [clonazepam], Fish allergy, Flexeril [cyclobenzaprine hcl], Ibuprofen, Shellfish allergy, Tylenol [acetaminophen], Ace inhibitors, Cyclobenzaprine, Tramadol, and Trazodone and nefazodone    Review of Systems   Review of Systems  Constitutional:  Negative for chills and fever.  Respiratory:  Negative for shortness of breath.   Cardiovascular:  Negative for chest pain.  Gastrointestinal:  Positive for abdominal pain and nausea. Negative for vomiting.  Genitourinary:  Positive for vaginal bleeding. Negative for enuresis, flank pain and vaginal pain.  Neurological:  Negative for headaches.   Physical Exam Updated Vital Signs BP 133/74 (BP Location: Right Arm)   Pulse 78   Temp 97.8 F (36.6 C) (Oral)   Resp 20   Ht '4\' 11"'  (1.499 m)   Wt 84 kg   LMP 06/07/2020   SpO2 100%   BMI 37.40 kg/m  Physical Exam Vitals and nursing note reviewed.  Constitutional:      General: She is not in acute distress.    Appearance: She is not ill-appearing.  HENT:     Head: Normocephalic and atraumatic.     Nose: No congestion.  Eyes:     Conjunctiva/sclera: Conjunctivae normal.  Cardiovascular:     Rate and Rhythm: Normal rate and regular rhythm.     Pulses: Normal pulses.     Heart sounds: No murmur heard.   No friction rub. No gallop.  Pulmonary:     Effort: No respiratory distress.     Breath sounds: No wheezing, rhonchi or rales.  Abdominal:     Tenderness: There is abdominal tenderness. There is no right CVA tenderness or left CVA tenderness.     Comments: Abdomen nondistended, had slight tenderness in the right lower quadrant as well as right side, no right-sided CVA tenderness or left-sided CVA tenderness, no guarding rebound tenderness or peritoneal sign negative Murphy sign no McBurney point she  had no suprapubic tenderness.  Musculoskeletal:     Right lower leg: No edema.     Left lower leg: No edema.  Skin:    General: Skin is warm and dry.  Neurological:     Mental Status: She is alert.  Psychiatric:        Mood and Affect: Mood normal.    ED Results / Procedures / Treatments   Labs (all labs ordered are listed, but only abnormal results are displayed) Labs Reviewed  COMPREHENSIVE METABOLIC PANEL - Abnormal; Notable for the following components:      Result Value   Total Bilirubin <0.1 (*)    All other components within normal limits  CBC - Abnormal; Notable for the following components:   Hemoglobin 11.9 (*)  RDW 17.6 (*)    Platelets 680 (*)    All other components within normal limits  URINALYSIS, ROUTINE W REFLEX MICROSCOPIC - Abnormal; Notable for the following components:   Specific Gravity, Urine 1.044 (*)    Leukocytes,Ua TRACE (*)    Bacteria, UA RARE (*)    All other components within normal limits  LIPASE, BLOOD    EKG None  Radiology US Transvaginal Non-OB  Result Date: 09/14/2021 CLINICAL DATA:  Right adnexal pain EXAM: TRANSABDOMINAL AND TRANSVAGINAL ULTRASOUND OF PELVIS DOPPLER ULTRASOUND OF OVARIES TECHNIQUE: Both transabdominal and transvaginal ultrasound examinations of the pelvis were performed. Transabdominal technique was performed for global imaging of the pelvis including uterus, ovaries, adnexal regions, and pelvic cul-de-sac. It was necessary to proceed with endovaginal exam following the transabdominal exam to visualize the ovaries. Color and duplex Doppler ultrasound was utilized to evaluate blood flow to the ovaries. COMPARISON:  CT 09/14/2021 FINDINGS: Uterus Measurements: 10.6 x 5.7 x 6.4 cm = volume: 199 mL. Multiple uterine fibroids, largest at the posterior uterine body/fundus measuring up to 3.9 cm. Largest anterior uterine body fibroid measures up to 2.3 cm. Endometrium Obscured by adjacent fibroids. Right ovary Measurements: 3.2  x 2.1 x 2.1 cm = volume: 7.6 mL. Incidentally noted 1.7 cm right ovarian cyst or follicle. Otherwise unremarkable. Left ovary Obscured by shadowing bowel gas. Pulsed Doppler evaluation of the right ovary demonstrates normal low-resistance arterial and venous waveforms. Other findings No abnormal free fluid. IMPRESSION: 1. No evidence of right adnexal torsion. 2. Left ovary was not visualized, obscured by overlying bowel gas. 3. Multiple uterine fibroids, measuring up to 3.9 cm. 4. Small 1.7 cm right ovarian cyst or follicle. No followup imaging recommended. Note: This recommendation does not apply to premenarchal patients or to those with increased risk (genetic, family history, elevated tumor markers or other high-risk factors) of ovarian cancer. Reference: Radiology 2019 Nov; 293(2):359-371. Electronically Signed   By: Davina Poke D.O.   On: 09/14/2021 16:02   US Pelvis Complete  Result Date: 09/14/2021 CLINICAL DATA:  Right adnexal pain EXAM: TRANSABDOMINAL AND TRANSVAGINAL ULTRASOUND OF PELVIS DOPPLER ULTRASOUND OF OVARIES TECHNIQUE: Both transabdominal and transvaginal ultrasound examinations of the pelvis were performed. Transabdominal technique was performed for global imaging of the pelvis including uterus, ovaries, adnexal regions, and pelvic cul-de-sac. It was necessary to proceed with endovaginal exam following the transabdominal exam to visualize the ovaries. Color and duplex Doppler ultrasound was utilized to evaluate blood flow to the ovaries. COMPARISON:  CT 09/14/2021 FINDINGS: Uterus Measurements: 10.6 x 5.7 x 6.4 cm = volume: 199 mL. Multiple uterine fibroids, largest at the posterior uterine body/fundus measuring up to 3.9 cm. Largest anterior uterine body fibroid measures up to 2.3 cm. Endometrium Obscured by adjacent fibroids. Right ovary Measurements: 3.2 x 2.1 x 2.1 cm = volume: 7.6 mL. Incidentally noted 1.7 cm right ovarian cyst or follicle. Otherwise unremarkable. Left ovary  Obscured by shadowing bowel gas. Pulsed Doppler evaluation of the right ovary demonstrates normal low-resistance arterial and venous waveforms. Other findings No abnormal free fluid. IMPRESSION: 1. No evidence of right adnexal torsion. 2. Left ovary was not visualized, obscured by overlying bowel gas. 3. Multiple uterine fibroids, measuring up to 3.9 cm. 4. Small 1.7 cm right ovarian cyst or follicle. No followup imaging recommended. Note: This recommendation does not apply to premenarchal patients or to those with increased risk (genetic, family history, elevated tumor markers or other high-risk factors) of ovarian cancer. Reference: Radiology 2019 Nov; 293(2):359-371. Electronically Signed  By: Davina Poke D.O.   On: 09/14/2021 16:02   CT ABDOMEN PELVIS W CONTRAST  Result Date: 09/14/2021 CLINICAL DATA:  Right lower quadrant abdominal pain and nausea. EXAM: CT ABDOMEN AND PELVIS WITH CONTRAST TECHNIQUE: Multidetector CT imaging of the abdomen and pelvis was performed using the standard protocol following bolus administration of intravenous contrast. RADIATION DOSE REDUCTION: This exam was performed according to the departmental dose-optimization program which includes automated exposure control, adjustment of the mA and/or kV according to patient size and/or use of iterative reconstruction technique. CONTRAST:  142m OMNIPAQUE IOHEXOL 300 MG/ML  SOLN COMPARISON:  06/10/2021. FINDINGS: Lower chest: Lung bases are clear. Heart is at the upper limits of normal in size. No pericardial or pleural effusion. Distal esophagus is grossly unremarkable. Hepatobiliary: Blush of hyperattenuation in the dome of the liver measures 9 mm, as on 06/10/2021, possibly a flash fill hemangioma. There are additional vague areas of low-attenuation in the liver bilaterally, measuring up to 8 mm, too small to characterize. Liver and gallbladder are otherwise unremarkable. No biliary ductal dilatation. Pancreas: Negative. Spleen:  Negative. Adrenals/Urinary Tract: Adrenal glands and kidneys are unremarkable. Ureters are decompressed. Bladder is grossly unremarkable. Stomach/Bowel: Stomach, small bowel, appendix and colon are unremarkable. Duodenal diverticulum incidentally noted. Vascular/Lymphatic: Atherosclerotic calcification of the aorta. No pathologically enlarged lymph nodes. Reproductive: Uterus appears septate or bicornuate with fluid in the endometrial canal. Probable uterine fibroids. Low-attenuation lesion in the right ovary measures 2.4 cm, new from 06/10/2021. Other: No free fluid. Mesenteries and peritoneum are otherwise unremarkable. Musculoskeletal: Postoperative changes in the spine. Subtle changes of avascular necrosis in the femoral heads. No worrisome lytic or sclerotic lesions. IMPRESSION: 1. No acute findings to explain the patient's pain and nausea. 2. Right ovarian cyst, new from 06/10/2021. No follow-up imaging recommended. Note: This recommendation does not apply to premenarchal patients and to those with increased risk (genetic, family history, elevated tumor markers or other high-risk factors) of ovarian cancer. Reference: JACR 2020 Feb; 17(2):248-254 . 3. Subtle changes of avascular necrosis in the femoral heads. 4.  Aortic atherosclerosis (ICD10-I70.0). Electronically Signed   By: MLorin PicketM.D.   On: 09/14/2021 13:33   [place]Art/Ven Flow Abd Pelv Doppler  Result Date: 09/14/2021 CLINICAL DATA:  Right adnexal pain EXAM: TRANSABDOMINAL AND TRANSVAGINAL ULTRASOUND OF PELVIS DOPPLER ULTRASOUND OF OVARIES TECHNIQUE: Both transabdominal and transvaginal ultrasound examinations of the pelvis were performed. Transabdominal technique was performed for global imaging of the pelvis including uterus, ovaries, adnexal regions, and pelvic cul-de-sac. It was necessary to proceed with endovaginal exam following the transabdominal exam to visualize the ovaries. Color and duplex Doppler ultrasound was utilized to evaluate  blood flow to the ovaries. COMPARISON:  CT 09/14/2021 FINDINGS: Uterus Measurements: 10.6 x 5.7 x 6.4 cm = volume: 199 mL. Multiple uterine fibroids, largest at the posterior uterine body/fundus measuring up to 3.9 cm. Largest anterior uterine body fibroid measures up to 2.3 cm. Endometrium Obscured by adjacent fibroids. Right ovary Measurements: 3.2 x 2.1 x 2.1 cm = volume: 7.6 mL. Incidentally noted 1.7 cm right ovarian cyst or follicle. Otherwise unremarkable. Left ovary Obscured by shadowing bowel gas. Pulsed Doppler evaluation of the right ovary demonstrates normal low-resistance arterial and venous waveforms. Other findings No abnormal free fluid. IMPRESSION: 1. No evidence of right adnexal torsion. 2. Left ovary was not visualized, obscured by overlying bowel gas. 3. Multiple uterine fibroids, measuring up to 3.9 cm. 4. Small 1.7 cm right ovarian cyst or follicle. No followup imaging recommended. Note:  This recommendation does not apply to premenarchal patients or to those with increased risk (genetic, family history, elevated tumor markers or other high-risk factors) of ovarian cancer. Reference: Radiology 2019 Nov; 293(2):359-371. Electronically Signed   By: Davina Poke D.O.   On: 09/14/2021 16:02    Procedures Procedures    Medications Ordered in ED Medications  sodium chloride 0.9 % bolus 1,000 mL (1,000 mLs Intravenous New Bag/Given 09/14/21 1213)  ondansetron (ZOFRAN) injection 4 mg (4 mg Intravenous Given 09/14/21 1214)  HYDROmorphone (DILAUDID) injection 1 mg (1 mg Intravenous Given 09/14/21 1214)  iohexol (OMNIPAQUE) 300 MG/ML solution 100 mL (100 mLs Intravenous Contrast Given 09/14/21 1310)  HYDROmorphone (DILAUDID) injection 0.5 mg (0.5 mg Intravenous Given 09/14/21 1427)    ED Course/ Medical Decision Making/ A&P                           Medical Decision Making Amount and/or Complexity of Data Reviewed Radiology: ordered.  Risk Prescription drug management.   This  patient presents to the ED for concern of abdominal pain, this involves an extensive number of treatment options, and is a complaint that carries with it a high risk of complications and morbidity.  The differential diagnosis includes diverticulitis, bowel obstruction, appendicitis, kidney stone UTI    Additional history obtained:  Additional history obtained from N/A External records from outside source obtained and reviewed including previous medical history, imaging, recent EDG   Co morbidities that complicate the patient evaluation  GERD  Social Determinants of Health:  N/A    Lab Tests:  I Ordered, and personally interpreted labs.  The pertinent results include: CBC shows there is anemia hemoglobin of 11.9, CMP unremarkable, lipase 29   Imaging Studies ordered:  I ordered imaging studies including CT and pelvis, transvaginal ultrasound I independently visualized and interpreted imaging which showed shows right-sided adnexal cysts without any acute findings I agree with the radiologist interpretation   Cardiac Monitoring:  The patient was maintained on a cardiac monitor.  I personally viewed and interpreted the cardiac monitored which showed an underlying rhythm of: N/A   Medicines ordered and prescription drug management:  I ordered medication including fluids, antiemetics, pain medication I have reviewed the patients home medicines and have made adjustments as needed  Critical Interventions:  N/A   Reevaluation:  Presents with right lower quadrant tenderness, she was tender on my exam, I am concerned for possible appendicitis diverticulitis kidney stone/pyelowill obtain basic lab work-up imaging prior to pain medication and reassess  CT scan reveals right-sided cyst, patient was reassessed she is still endorsing pain in the right lower side, since this is a new cyst not seen on previous studies I am concerned for possible torsion will obtain ultrasound for  rule out.  Update on lab work imaging states she is feeling better having no complaints she is agreement plan and discharge.    Consultations Obtained:  N/A    Test Considered:  N/A    Rule out low suspicion for lower lobe pneumonia as lung sounds are clear bilaterally, will defer imaging at this time.  I have low suspicion for liver or gallbladder abnormality as she has no right upper quadrant tenderness, liver enzymes, alk phos, T bili all within normal limits.  Low suspicion for pancreatitis as lipase is within normal limits.  Low suspicion for ruptured stomach ulcer as she has no peritoneal sign present on exam.  Low suspicion for bowel obstruction, complicated diverticulitis,  volvulus, intra-abdominal infection CT imaging negative for these findings.  I have low suspicion for AAA or dissection no bulging mass, patient has low risk factors.  Low suspicion for UTI, pyelo-, kidney stone denies any urinary symptoms, UA is inconsistent for infection or stone.  Low suspicion for ovarian torsion as imaging is negative for this.   Dispostion and problem list  After consideration of the diagnostic results and the patients response to treatment, I feel that the patent would benefit from discharge.  Right ovarian cyst-likely part of patient's pain, will recommend NSAIDs, follow-up with PCP as needed. 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            Final Clinical Impression(s) / ED Diagnoses Final diagnoses:  Right lower quadrant abdominal pain  AVN (avascular necrosis of bone) [facility])    Rx / DC Orders ED Discharge Orders     None         Marcello Fennel, PA-C 09/14/21 1636    Godfrey Pick, MD 09/19/21 [phone]

## 2021-09-14 NOTE — ED Triage Notes (Signed)
Pt c/o right side abdominal pain that radiates to her back for the last few weeks; pt denies any n/v/d

## 2021-09-20 ENCOUNTER — Ambulatory Visit: Payer: Self-pay | Admitting: Physician Assistant

## 2021-09-21 ENCOUNTER — Other Ambulatory Visit (HOSPITAL_COMMUNITY): Payer: 59

## 2021-09-22 NOTE — Progress Notes (Addendum)
COVID Vaccine Completed:  Yes  Date of COVID positive in last 90 days:  PCP - Ihor Dow, MD Cardiologist - Harvie Heck, MD  Chest x-ray - 06-19-21 Epic EKG - 06-20-21 Epic Stress Test -  ECHO -  Cardiac Cath -  Pacemaker/ICD device last checked: Spinal Cord Stimulator:  Bowel Prep -   Sleep Study - Yes, +sleep apnea CPAP -   Fasting Blood Sugar -  Checks Blood Sugar _____ times a day  Blood Thinner Instructions:  Eliquis? Aspirin Instructions: Last Dose:  Activity level:  Can go up a flight of stairs and perform activities of daily living without stopping and without symptoms of chest pain or shortness of breath.  Able to exercise without symptoms  Unable to go up a flight of stairs without symptoms of     Anesthesia review:  Hx of chest pain evaluated by cardiology.   Echo recommended by cardiology 02-02-21   Patient denies shortness of breath, fever, cough and chest pain at PAT appointment  Patient verbalized understanding of instructions that were given to them at the PAT appointment. Patient was also instructed that they will need to review over the PAT instructions again at home before surgery.

## 2021-09-22 NOTE — Patient Instructions (Addendum)
DUE TO COVID-19 ONLY TWO VISITORS  (aged 52 and older)  IS ALLOWED TO COME WITH YOU AND STAY IN THE WAITING ROOM ONLY DURING PRE OP AND PROCEDURE.   **NO VISITORS ARE ALLOWED IN THE SHORT STAY AREA OR RECOVERY ROOM!!**  IF YOU WILL BE ADMITTED INTO THE HOSPITAL YOU ARE ALLOWED ONLY FOUR SUPPORT PEOPLE DURING VISITATION HOURS ONLY (7 AM -8PM)   The support person(s) must pass our screening, gel in and out Visitors GUEST BADGE MUST BE WORN VISIBLY  One adult visitor may remain with you overnight and MUST be in the room by 8 P.M.   You are not required to [employer] often Do NOT share personal items Notify your provider if you are in close contact with someone who has COVID or you develop fever 100.4 or greater, new onset of sneezing, cough, sore throat, shortness of breath or body aches.        Your procedure is scheduled on:  10-03-21   Report to [facility] Main Entrance    Report to admitting at 6:45 AM   Call this number if you have problems the morning of surgery [phone]   Do not eat food :After Midnight the night before surgery   After Midnight you may have the following liquids until 6:30 AM DAY OF SURGERY  Clear Liquid Diet Water Black Coffee (sugar ok, NO MILK/CREAM OR CREAMERS)  Tea (sugar ok, NO MILK/CREAM OR CREAMERS) regular and decaf                             Plain Jell-O (NO RED)                                           Fruit ices (not with fruit pulp, NO RED)                                     Popsicles (NO RED)                                                                  Juice: apple, WHITE grape, WHITE cranberry Sports drinks like Gatorade (NO RED) Clear broth(vegetable,chicken,beef)                   The day of surgery:  Drink ONE (1) Pre-Surgery Clear Ensure or G2 at 6:30 AM the morning of surgery. Drink in one sitting. Do not sip.  This drink was given to you during your hospital  pre-op appointment visit. Nothing else  to drink after completing the Pre-Surgery Clear Ensure          If you have questions, please contact your surgeon's office.   FOLLOW  ANY ADDITIONAL PRE OP INSTRUCTIONS YOU RECEIVED FROM YOUR SURGEON'S OFFICE!!!     Oral Hygiene is also important to reduce your risk of infection.  Remember - BRUSH YOUR TEETH THE MORNING OF SURGERY WITH YOUR REGULAR TOOTHPASTE   Do NOT smoke after Midnight  Take these medicines the morning of surgery with A SIP OF WATER: Hydralazine, Pregabalin, Citalopram, Omeprazole, Amlodipine, Duloxetine, Clonidine, Risperidone.  Okay to use inhalers and Zofran if needed   DO NOT [place]. PHARMACY WILL DISPENSE MEDICATIONS LISTED ON YOUR MEDICATION LIST TO YOU DURING YOUR ADMISSION [place]!                              You may not have any metal on your body including hair pins, jewelry, and body piercing             Do not wear make-up, lotions, powders, perfumes or deodorant  Do not wear nail polish including gel and S&S, artificial/acrylic nails, or any other type of covering on natural nails including finger and toenails. If you have artificial nails, gel coating, etc. that needs to be removed by a nail salon please have this removed prior to surgery or surgery may need to be canceled/ delayed if the surgeon/ anesthesia feels like they are unable to be safely monitored.   Do not shave  48 hours prior to surgery.         Contacts, dentures or bridgework may not be worn into surgery.   Bring small overnight bag day of surgery.  Do not bring valuables to the hospital. [place].  Special Instructions: Bring a copy of your healthcare power of attorney and living will documents the day of surgery if you haven't scanned them before.  Please read over the following fact sheets you were given: IF YOU HAVE QUESTIONS ABOUT YOUR PRE-OP INSTRUCTIONS PLEASE  CALL [facility] - Preparing for Surgery Before surgery, you can play an important role.  Because skin is not sterile, your skin needs to be as free of germs as possible.  You can reduce the number of germs on your skin by washing with CHG (chlorahexidine gluconate) soap before surgery.  CHG is an antiseptic cleaner which kills germs and bonds with the skin to continue killing germs even after washing. Please DO NOT use if you have an allergy to CHG or antibacterial soaps.  If your skin becomes reddened/irritated stop using the CHG and inform your nurse when you arrive at Short Stay. Do not shave (including legs and underarms) for at least 48 hours prior to the first CHG shower.  You may shave your face/neck.  Please follow these instructions carefully:  1.  Shower with CHG Soap the night before surgery and the  morning of surgery.  2.  If you choose to wash your hair, wash your hair first as usual with your normal  shampoo.  3.  After you shampoo, rinse your hair and body thoroughly to remove the shampoo.                             4.  Use CHG as you would any other liquid soap.  You can apply chg directly to the skin and wash.  Gently with a scrungie or clean washcloth.  5.  Apply the CHG Soap to your body ONLY FROM THE NECK DOWN.   Do   not use on face/ open  Wound or open sores. Avoid contact with eyes, ears mouth and   genitals (private parts).                       Wash face,  Genitals (private parts) with your normal soap.             6.  Wash thoroughly, paying special attention to the area where your    surgery  will be performed.  7.  Thoroughly rinse your body with warm water from the neck down.  8.  DO NOT shower/wash with your normal soap after using and rinsing off the CHG Soap.                9.  Pat yourself dry with a clean towel.            10.  Wear clean pajamas.            11.  Place clean sheets on your bed the night of your first  shower and do not  sleep with pets. Day of Surgery : Do not apply any lotions/deodorants the morning of surgery.  Please wear clean clothes to the hospital/surgery center.  FAILURE TO FOLLOW THESE INSTRUCTIONS MAY RESULT IN THE CANCELLATION OF YOUR SURGERY  PATIENT SIGNATURE_________________________________  NURSE SIGNATURE__________________________________  ________________________________________________________________________      Kathryn [name]    An incentive spirometer is a tool that can help keep your lungs clear and active. This tool measures how well you are filling your lungs with each breath. Taking long deep breaths may help reverse or decrease the chance of developing breathing (pulmonary) problems (especially infection) following: A long period of time when you are unable to move or be active. BEFORE THE PROCEDURE  If the spirometer includes an indicator to show your best effort, your nurse or respiratory therapist will set it to a desired goal. If possible, sit up straight or lean slightly forward. Try not to slouch. Hold the incentive spirometer in an upright position. INSTRUCTIONS FOR USE  Sit on the edge of your bed if possible, or sit up as far as you can in bed or on a chair. Hold the incentive spirometer in an upright position. Breathe out normally. Place the mouthpiece in your mouth and seal your lips tightly around it. Breathe in slowly and as deeply as possible, raising the piston or the ball toward the top of the column. Hold your breath for 3-5 seconds or for as long as possible. Allow the piston or ball to fall to the bottom of the column. Remove the mouthpiece from your mouth and breathe out normally. Rest for a few seconds and repeat Steps 1 through 7 at least 10 times every 1-2 hours when you are awake. Take your time and take a few normal breaths between deep breaths. The spirometer may include an indicator to show your best effort. Use the  indicator as a goal to work toward during each repetition. After each set of 10 deep breaths, practice coughing to be sure your lungs are clear. If you have an incision (the cut made at the time of surgery), support your incision when coughing by placing a pillow or rolled up towels firmly against it. Once you are able to get out of bed, walk around indoors and cough well. You may stop using the incentive spirometer when instructed by your caregiver.  RISKS AND COMPLICATIONS Take your time so you do not get dizzy or light-headed. If  you are in pain, you may need to take or ask for pain medication before doing incentive spirometry. It is harder to take a deep breath if you are having pain. AFTER USE Rest and breathe slowly and easily. It can be helpful to keep track of a log of your progress. Your caregiver can provide you with a simple table to help with this. If you are using the spirometer at home, follow these instructions: [place] IF:  You are having difficultly using the spirometer. You have trouble using the spirometer as often as instructed. Your pain medication is not giving enough relief while using the spirometer. You develop fever of 100.5 F (38.1 C) or higher.                                                                                                    SEEK IMMEDIATE MEDICAL CARE IF:  You cough up bloody sputum that had not been present before. You develop fever of 102 F (38.9 C) or greater. You develop worsening pain at or near the incision site. MAKE SURE YOU:  Understand these instructions. Will watch your condition. Will get help right away if you are not doing well or get worse. Document Released: 08/14/2006 Document Revised: 06/26/2011 Document Reviewed: 10/15/2006 [facility] Patient Information 2014 [place], [place].

## 2021-09-23 ENCOUNTER — Encounter (HOSPITAL_COMMUNITY)
Admission: RE | Admit: 2021-09-23 | Discharge: 2021-09-23 | Disposition: A | Discharge status: Home / Self Care | Payer: 59 | Source: Ambulatory Visit | Attending: Anesthesiology | Admitting: Anesthesiology

## 2021-09-23 DIAGNOSIS — Z01818 Encounter for other preprocedural examination: Secondary | ICD-10-CM

## 2021-09-23 NOTE — Progress Notes (Signed)
Patient was a no-show for PAT appointment 09-23-21.  Spoke to patient's daughter, she stated that patient was not in.  I attempted to reach patient multiple times with no answer and voicemail was full.  Scheduler notified.

## 2021-09-30 ENCOUNTER — Encounter (HOSPITAL_COMMUNITY): Payer: Self-pay | Admitting: Physician Assistant

## 2021-09-30 ENCOUNTER — Encounter (HOSPITAL_COMMUNITY)
Admission: RE | Admit: 2021-09-30 | Discharge: 2021-09-30 | Disposition: A | Discharge status: Home / Self Care | Payer: 59 | Source: Ambulatory Visit | Attending: Orthopedic Surgery | Admitting: Orthopedic Surgery

## 2021-09-30 ENCOUNTER — Encounter (HOSPITAL_COMMUNITY): Payer: Self-pay

## 2021-09-30 ENCOUNTER — Other Ambulatory Visit: Payer: Self-pay

## 2021-09-30 HISTORY — DX: Migraine, unspecified, not intractable, without status migrainosus: G43.909

## 2021-09-30 HISTORY — DX: Dyspnea, unspecified: R06.00

## 2021-09-30 NOTE — Anesthesia Preprocedure Evaluation (Deleted)
Anesthesia Evaluation    Airway        Dental   Pulmonary former smoker,           Cardiovascular hypertension,      Neuro/Psych    GI/Hepatic   Endo/Other    Renal/GU      Musculoskeletal   Abdominal   Peds  Hematology   Anesthesia Other Findings   Reproductive/Obstetrics                             Anesthesia Physical Anesthesia Plan  ASA:   Anesthesia Plan:    Post-op Pain Management:    Induction:   PONV Risk Score and Plan:   Airway Management Planned:   Additional Equipment:   Intra-op Plan:   Post-operative Plan:   Informed Consent:   Plan Discussed with:   Anesthesia Plan Comments: (See PAT note 09/30/2021)        Anesthesia Quick Evaluation

## 2021-09-30 NOTE — Patient Instructions (Addendum)
DUE TO COVID-19 ONLY TWO VISITORS  (aged 52 and older)  IS ALLOWED TO COME WITH YOU AND STAY IN THE WAITING ROOM ONLY DURING PRE OP AND PROCEDURE.   **NO VISITORS ARE ALLOWED IN THE SHORT STAY AREA OR RECOVERY ROOM!!**  IF YOU WILL BE ADMITTED INTO THE HOSPITAL YOU ARE ALLOWED ONLY FOUR SUPPORT PEOPLE DURING VISITATION HOURS ONLY (7 AM -8PM)   The support person(s) must pass our screening, gel in and out Visitors GUEST BADGE MUST BE WORN VISIBLY  One adult visitor may remain with you overnight and MUST be in the room by 8 P.M.   You are not required to [employer] often Do NOT share personal items Notify your provider if you are in close contact with someone who has COVID or you develop fever 100.4 or greater, new onset of sneezing, cough, sore throat, shortness of breath or body aches.        Your procedure is scheduled on:  10-03-21   Report to [facility] Main Entrance    Report to admitting at 7:40 AM   Call this number if you have problems the morning of surgery [phone]   Do not eat food :After Midnight the night before surgery   After Midnight you may have the following liquids until 7:00 AM DAY OF SURGERY  Clear Liquid Diet Water Black Coffee (sugar ok, NO MILK/CREAM OR CREAMERS)  Tea (sugar ok, NO MILK/CREAM OR CREAMERS) regular and decaf                             Plain Jell-O (NO RED)                                           Fruit ices (not with fruit pulp, NO RED)                                     Popsicles (NO RED)                                                                  Juice: apple, WHITE grape, WHITE cranberry Sports drinks like Gatorade (NO RED) Clear broth(vegetable,chicken,beef)                   The day of surgery:  Drink ONE (1) Pre-Surgery Clear Ensure at 7:00  AM the morning of surgery. Drink in one sitting. Do not sip.  This drink was given to you during your hospital  pre-op appointment visit. Nothing else to  drink after completing the Pre-Surgery Clear Ensure          If you have questions, please contact your surgeon's office.   FOLLOW  ANY ADDITIONAL PRE OP INSTRUCTIONS YOU RECEIVED FROM YOUR SURGEON'S OFFICE!!!     Oral Hygiene is also important to reduce your risk of infection.  Remember - BRUSH YOUR TEETH THE MORNING OF SURGERY WITH YOUR REGULAR TOOTHPASTE   Do NOT smoke after Midnight  Take these medicines the morning of surgery with A SIP OF WATER: Amlodipine, Citalopram, Clonidine, Duloxetine, Hydroxyzine, Omeprazole, Ondansetron, Pregabalin, Risperidone. Okay to use inhalers   DO NOT [place]. PHARMACY WILL DISPENSE MEDICATIONS LISTED ON YOUR MEDICATION LIST TO YOU DURING YOUR ADMISSION [place]!                              You may not have any metal on your body including hair pins, jewelry, and body piercing             Do not wear make-up, lotions, powders, perfumes, or deodorant  Do not wear nail polish including gel and S&S, artificial/acrylic nails, or any other type of covering on natural nails including finger and toenails. If you have artificial nails, gel coating, etc. that needs to be removed by a nail salon please have this removed prior to surgery or surgery may need to be canceled/ delayed if the surgeon/ anesthesia feels like they are unable to be safely monitored.   Do not shave  48 hours prior to surgery.    Contacts, dentures or bridgework may not be worn into surgery.   Bring small overnight bag day of surgery.  Do not bring valuables to the hospital. [place].   Please read over the following fact sheets you were given: IF YOU HAVE QUESTIONS ABOUT YOUR PRE-OP INSTRUCTIONS PLEASE CALL [facility] - Preparing for Surgery Before surgery, you can play an important role.  Because skin is not sterile, your skin needs to be as free  of germs as possible.  You can reduce the number of germs on your skin by washing with CHG (chlorahexidine gluconate) soap before surgery.  CHG is an antiseptic cleaner which kills germs and bonds with the skin to continue killing germs even after washing. Please DO NOT use if you have an allergy to CHG or antibacterial soaps.  If your skin becomes reddened/irritated stop using the CHG and inform your nurse when you arrive at Short Stay. Do not shave (including legs and underarms) for at least 48 hours prior to the first CHG shower.  You may shave your face/neck.  Please follow these instructions carefully:  1.  Shower with CHG Soap the night before surgery and the  morning of surgery.  2.  If you choose to wash your hair, wash your hair first as usual with your normal  shampoo.  3.  After you shampoo, rinse your hair and body thoroughly to remove the shampoo.                             4.  Use CHG as you would any other liquid soap.  You can apply chg directly to the skin and wash.  Gently with a scrungie or clean washcloth.  5.  Apply the CHG Soap to your body ONLY FROM THE NECK DOWN.   Do   not use on face/ open                           Wound or open sores. Avoid contact with eyes, ears mouth and   genitals (private  parts).                       Wash face,  Genitals (private parts) with your normal soap.             6.  Wash thoroughly, paying special attention to the area where your    surgery  will be performed.  7.  Thoroughly rinse your body with warm water from the neck down.  8.  DO NOT shower/wash with your normal soap after using and rinsing off the CHG Soap.                9.  Pat yourself dry with a clean towel.            10.  Wear clean pajamas.            11.  Place clean sheets on your bed the night of your first shower and do not  sleep with pets. Day of Surgery : Do not apply any lotions/deodorants the morning of surgery.  Please wear clean clothes to the hospital/surgery  center.  FAILURE TO FOLLOW THESE INSTRUCTIONS MAY RESULT IN THE CANCELLATION OF YOUR SURGERY  PATIENT SIGNATURE_________________________________  NURSE SIGNATURE__________________________________  ________________________________________________________________________     Kathryn [name]  An incentive spirometer is a tool that can help keep your lungs clear and active. This tool measures how well you are filling your lungs with each breath. Taking long deep breaths may help reverse or decrease the chance of developing breathing (pulmonary) problems (especially infection) following: A long period of time when you are unable to move or be active. BEFORE THE PROCEDURE  If the spirometer includes an indicator to show your best effort, your nurse or respiratory therapist will set it to a desired goal. If possible, sit up straight or lean slightly forward. Try not to slouch. Hold the incentive spirometer in an upright position. INSTRUCTIONS FOR USE  Sit on the edge of your bed if possible, or sit up as far as you can in bed or on a chair. Hold the incentive spirometer in an upright position. Breathe out normally. Place the mouthpiece in your mouth and seal your lips tightly around it. Breathe in slowly and as deeply as possible, raising the piston or the ball toward the top of the column. Hold your breath for 3-5 seconds or for as long as possible. Allow the piston or ball to fall to the bottom of the column. Remove the mouthpiece from your mouth and breathe out normally. Rest for a few seconds and repeat Steps 1 through 7 at least 10 times every 1-2 hours when you are awake. Take your time and take a few normal breaths between deep breaths. The spirometer may include an indicator to show your best effort. Use the indicator as a goal to work toward during each repetition. After each set of 10 deep breaths, practice coughing to be sure your lungs are clear. If you have an incision (the  cut made at the time of surgery), support your incision when coughing by placing a pillow or rolled up towels firmly against it. Once you are able to get out of bed, walk around indoors and cough well. You may stop using the incentive spirometer when instructed by your caregiver.  RISKS AND COMPLICATIONS Take your time so you do not get dizzy or light-headed. If you are in pain, you may need to take or ask for pain medication before doing incentive spirometry.  It is harder to take a deep breath if you are having pain. AFTER USE Rest and breathe slowly and easily. It can be helpful to keep track of a log of your progress. Your caregiver can provide you with a simple table to help with this. If you are using the spirometer at home, follow these instructions: [place] IF:  You are having difficultly using the spirometer. You have trouble using the spirometer as often as instructed. Your pain medication is not giving enough relief while using the spirometer. You develop fever of 100.5 F (38.1 C) or higher. SEEK IMMEDIATE MEDICAL CARE IF:  You cough up bloody sputum that had not been present before. You develop fever of 102 F (38.9 C) or greater. You develop worsening pain at or near the incision site. MAKE SURE YOU:  Understand these instructions. Will watch your condition. Will get help right away if you are not doing well or get worse. Document Released: 08/14/2006 Document Revised: 06/26/2011 Document Reviewed: 10/15/2006 [employer] [facility] Patient Information 2014 [place], [place].   ________________________________________________________________________

## 2021-09-30 NOTE — Progress Notes (Signed)
Patient was a no-show for PAT appt today 09-30-21 but was able to complete over the phone.

## 2021-09-30 NOTE — Progress Notes (Signed)
Anesthesia Chart Review   Case: [id number] Date/Time: 10/03/21 0956   Procedure: TOTAL KNEE ARTHROPLASTY (Right: Knee)   Anesthesia type: Spinal   Pre-op diagnosis: OA RIGHT KNEE   Location: [facility] ROOM 08 / [facility] ORS   Surgeons: Willaim Sheng, MD       DISCUSSION:51 y.o. former smoker with h/o HTN, GERD, COPD, sleep apnea, right knee OA scheduled for above procedure 10/03/2021 with Dr. Charlies Constable.   Pt seen by pulmonologist 05/17/2021 for preoperative evaluation.  Per OV note, "Preoperative evaluation: Pulmonary medicine does not provide preoperative clearance rather preoperative restratification.  Based on the ARISCAT model and assuming a surgical duration of less than 3 hours, patient is low risk of 1.6% risk of postop pulmonary complication.  There are no modifiable risk factors.  Please see below for recommendations. --Consider local anesthesia, nerve blocks as able to avoid general anesthesia --If general anesthesia is used, minimize time of neuromuscular blockade --Provide duo nebs in the preop area and in the PACU --Check blood gas intraoperatively if general anesthesia is used to assess for CO2 retention --If evidence of CO2 retention on blood gas, recommend extubation to BiPAP"  Seen by PCP 09/06/2021. Per OV note, "Medically optimized with moderate risk for TKA"  Anticipate pt can proceed with planned procedure barring acute status change.   VS: Ht 4' 11" (1.499 m)   Wt 73.5 kg   LMP 06/07/2020   BMI 32.72 kg/m   PROVIDERS: Lindell Spar, MD is PCP   Larey Days, MD is Pulmonologist  LABS: Labs reviewed: Acceptable for surgery. 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   Single subsegmental pulmonary embolism without acute cor pulmonale ([place]) 06/20/2021   Sleep apnea 04/2020   GETTING A cpap   Suicidal ideation 12/24/2018    Past Surgical History:  Procedure Laterality Date   BACK SURGERY     DILATATION AND CURETTAGE/HYSTEROSCOPY WITH MINERVA N/A 06/09/2020   Procedure: DILATATION AND CURETTAGE/HYSTEROSCOPY WITH MINERVA;  Surgeon: Florian Buff, MD;  Location: [facility] ORS;  Service: Gynecology;  Laterality: N/A;   ESOPHAGOGASTRODUODENOSCOPY (EGD) WITH PROPOFOL N/A 12/25/2018   Procedure: ESOPHAGOGASTRODUODENOSCOPY (EGD) WITH PROPOFOL;  Surgeon: Rogene Houston, MD;  Location: [facility] ENDO SUITE;  Service: Endoscopy;  Laterality: N/A;   PATELLA-FEMORAL ARTHROPLASTY Left 10/08/2018   Procedure: PATELLA-FEMORAL ARTHROPLASTY;  Surgeon: Marchia Bond, MD;  Location: [facility] ORS;  Service: Orthopedics;  Laterality: Left;   TUBAL LIGATION      MEDICATIONS:  albuterol (PROVENTIL) (2.5 MG/3ML) 0.083% nebulizer solution   albuterol (VENTOLIN HFA) 108 (90 Base) MCG/ACT inhaler   amLODipine (NORVASC) 5 MG tablet   Cholecalciferol (VITAMIN D) 50 MCG (2000 UT) CAPS   citalopram (CELEXA) 20 MG tablet   cloNIDine (CATAPRES) 0.1 MG tablet   Cyanocobalamin (VITAMIN B 12 PO)   DULoxetine (CYMBALTA) 30 MG capsule   fluticasone-salmeterol (WIXELA INHUB)  250-50 MCG/ACT AEPB   furosemide (LASIX) 20 MG tablet   hydrocortisone (ANUSOL-HC) 2.5 % rectal cream   hydrOXYzine (VISTARIL) 50 MG capsule   Misc. Devices MISC   omeprazole (PRILOSEC) 20 MG capsule   ondansetron (ZOFRAN-ODT) 4 MG disintegrating tablet   potassium chloride SA (KLOR-CON M) 20 MEQ tablet   pregabalin (LYRICA) 150 MG capsule   Respiratory Therapy Supplies  (NEBULIZER/TUBING/MOUTHPIECE) KIT   risperiDONE (RISPERDAL) 0.5 MG tablet   sucralfate (CARAFATE) 1 g tablet   tiZANidine (ZANAFLEX) 4 MG tablet   No current facility-administered medications for this encounter.    Konrad Felix Ward, PA-C [facility] Pre-Surgical Testing [phone]

## 2021-09-30 NOTE — Progress Notes (Addendum)
COVID Vaccine Completed:  Yes   Date of COVID positive in last 90 days:  No   PCP - Ihor Dow, MD Cardiologist - Harvie Heck, MD Pulmonology - Larey Days, MD  Medical clearance in Epic dated 09-06-21 by Dr. Posey Pronto (moderate risk)  Pulmonary clearance in note dated 05-17-21 by Dr. Silas Flood.   Chest x-ray - 06-19-21 Epic EKG - 06-20-21 Epic Stress Test - N/A ECHO - Recommended but has not completed  Cardiac Cath - N/A Pacemaker/ICD device last checked: Spinal Cord Stimulator:   Bowel Prep - N/A   Sleep Study - Yes, +sleep apnea CPAP - No, waiting on it to be delivered (stated that she uses her boyfriend's CPAP sometimes)   Fasting Blood Sugar - N/A Checks Blood Sugar _____ times a day   Blood Thinner Instructions:  N/A Aspirin Instructions: Last Dose:   Activity level:   Unable to climb stairs due to shortness of breath, back pain,  knee pain and neuropathy.     Patient states that she has shortness of breath at rest and with exertion.  States that it has not worsened.        Anesthesia review:  Hx of chest pain evaluated by cardiology.  Patient has chronic shortness of breath.   Echo recommended by cardiology 02-02-21 but has not completed    Patient denies fever, cough and chest pain at PAT appointment (completed over the phone)   Patient verbalized understanding of instructions that were given to them at the PAT appointment. Patient was also instructed that they will need to review over the PAT instructions again at home before surgery.

## 2021-10-01 ENCOUNTER — Emergency Department (HOSPITAL_COMMUNITY)
Admission: EM | Admit: 2021-10-01 | Discharge: 2021-10-01 | Disposition: A | Discharge status: Home / Self Care | Payer: 59 | Attending: Emergency Medicine | Admitting: Emergency Medicine

## 2021-10-01 ENCOUNTER — Other Ambulatory Visit: Payer: Self-pay

## 2021-10-01 ENCOUNTER — Encounter (HOSPITAL_COMMUNITY): Payer: Self-pay | Admitting: Emergency Medicine

## 2021-10-01 DIAGNOSIS — J45909 Unspecified asthma, uncomplicated: Secondary | ICD-10-CM | POA: Insufficient documentation

## 2021-10-01 DIAGNOSIS — H1033 Unspecified acute conjunctivitis, bilateral: Secondary | ICD-10-CM | POA: Diagnosis not present

## 2021-10-01 DIAGNOSIS — R1031 Right lower quadrant pain: Secondary | ICD-10-CM | POA: Insufficient documentation

## 2021-10-01 DIAGNOSIS — Z79899 Other long term (current) drug therapy: Secondary | ICD-10-CM | POA: Insufficient documentation

## 2021-10-01 DIAGNOSIS — J449 Chronic obstructive pulmonary disease, unspecified: Secondary | ICD-10-CM | POA: Insufficient documentation

## 2021-10-01 DIAGNOSIS — I10 Essential (primary) hypertension: Secondary | ICD-10-CM | POA: Diagnosis not present

## 2021-10-01 DIAGNOSIS — R109 Unspecified abdominal pain: Secondary | ICD-10-CM | POA: Diagnosis present

## 2021-10-01 LAB — COMPREHENSIVE METABOLIC PANEL
ALT: 25 U/L (ref 0–44)
AST: 32 U/L (ref 15–41)
Albumin: 4.1 g/dL (ref 3.5–5.0)
Alkaline Phosphatase: 88 U/L (ref 38–126)
Anion gap: 7 (ref 5–15)
BUN: 12 mg/dL (ref 6–20)
CO2: 25 mmol/L (ref 22–32)
Calcium: 8.9 mg/dL (ref 8.9–10.3)
Chloride: 104 mmol/L (ref 98–111)
Creatinine, Ser: 0.67 mg/dL (ref 0.44–1.00)
GFR, Estimated: >60 mL/min (ref >60)
Glucose, Bld: 93 mg/dL (ref 70–99)
Potassium: 4.2 mmol/L (ref 3.5–5.1)
Sodium: 136 mmol/L (ref 135–145)
Total Bilirubin: 0.7 mg/dL (ref 0.3–1.2)
Total Protein: 8.2 g/dL — ABNORMAL HIGH (ref 6.5–8.1)

## 2021-10-01 LAB — CBC WITH DIFFERENTIAL/PLATELET
Abs Immature Granulocytes: 0.05 10*3/uL (ref 0.00–0.07)
Basophils Absolute: 0.1 10*3/uL (ref 0.0–0.1)
Basophils Relative: 1 %
Eosinophils Absolute: 0.2 10*3/uL (ref 0.0–0.5)
Eosinophils Relative: 2 %
HCT: 40.5 % (ref 36.0–46.0)
Hemoglobin: 13.3 g/dL (ref 12.0–15.0)
Immature Granulocytes: 1 %
Lymphocytes Relative: 27 %
Lymphs Abs: 2.7 10*3/uL (ref 0.7–4.0)
MCH: 28.4 pg (ref 26.0–34.0)
MCHC: 32.8 g/dL (ref 30.0–36.0)
MCV: 86.5 fL (ref 80.0–100.0)
Monocytes Absolute: 0.5 10*3/uL (ref 0.1–1.0)
Monocytes Relative: 5 %
Neutro Abs: 6.7 10*3/uL (ref 1.7–7.7)
Neutrophils Relative %: 64 %
Platelets: 726 10*3/uL — ABNORMAL HIGH (ref 150–400)
RBC: 4.68 MIL/uL (ref 3.87–5.11)
RDW: 16.4 % — ABNORMAL HIGH (ref 11.5–15.5)
WBC: 10.2 10*3/uL (ref 4.0–10.5)
nRBC: 0 % (ref 0.0–0.2)

## 2021-10-01 LAB — LIPASE, BLOOD: Lipase: 30 U/L (ref 11–51)

## 2021-10-01 MED ORDER — HYDROMORPHONE HCL 1 MG/ML IJ SOLN
0.5000 mg | Freq: Once | INTRAMUSCULAR | Status: AC
  Administered 2021-10-01: 0.5 mg via INTRAVENOUS
  Filled 2021-10-01: qty 0.5

## 2021-10-01 MED ORDER — TOBRAMYCIN 0.3 % OP SOLN
2.0000 [drp] | OPHTHALMIC | Status: DC
  Administered 2021-10-01: 2 [drp] via OPHTHALMIC
  Filled 2021-10-01: qty 5

## 2021-10-01 MED ORDER — SODIUM CHLORIDE 0.9 % IV BOLUS
1000.0000 mL | Freq: Once | INTRAVENOUS | Status: AC
Start: 2021-10-01 — End: 2021-10-01
  Administered 2021-10-01: 1000 mL via INTRAVENOUS

## 2021-10-01 NOTE — ED Notes (Signed)
Dc instructions reviewed with pt no questions or concerns at this time. Will follow up with pcp next week. Pt wheeled to lobby waiting on family to pick up and transport home.

## 2021-10-01 NOTE — Discharge Instructions (Signed)
Your blood work today was reassuring.  You likely have an infection to both eyes.  Use 1 to 2 drops of the tobramycin to each eye every 4 hours for at least 5 to 7 days.  You may apply warm wet compresses on and off to your eyes.  Avoid rubbing your eyes excessively.  Please follow-up with your primary care provider for recheck next week.

## 2021-10-01 NOTE — ED Triage Notes (Signed)
Patient c/o right lower abd pain that radiates into back. Per patient some nausea and dysuria. Denies any vomiting, diarrhea, or fevers. Patient also c/o swelling redness and drainage from eyes bilateral that started on Tuesday and is progressively getting worse. Per patient eyes "crusted over in morning when waking."

## 2021-10-03 ENCOUNTER — Encounter (HOSPITAL_COMMUNITY): Admission: RE | Payer: Self-pay | Source: Home / Self Care

## 2021-10-03 ENCOUNTER — Ambulatory Visit (HOSPITAL_COMMUNITY): Admission: RE | Admit: 2021-10-03 | Payer: 59 | Source: Home / Self Care | Admitting: Orthopedic Surgery

## 2021-10-03 SURGERY — ARTHROPLASTY, KNEE, TOTAL
Anesthesia: Spinal | Site: Knee | Laterality: Right

## 2021-10-04 NOTE — ED Provider Notes (Signed)
[facility] EMERGENCY DEPARTMENT Provider Note   CSN: [id number] Arrival date & time: 10/01/21  1201     History  Chief Complaint  Patient presents with   Abdominal Pain    Kathryn [name] is a 51 y.o. female.   Abdominal Pain Associated symptoms: dysuria (intermittent dysuria) and nausea   Associated symptoms: no chest pain, no chills, no diarrhea, no fever, no shortness of breath, no sore throat, no vaginal bleeding, no vaginal discharge and no vomiting         Kathryn [name] is a 52 y.o. female with past medical history significant for hypertension, asthma, COPD, GERD, and chronic back pain who presents to the Emergency Department complaining of intermittent lower abdominal pain that radiates to her back.  Pain has been associated with some nausea although she states she is eating and drinking well.  Pain is also associated with some increased frequency of urination and burning.  She denies any diarrhea, vomiting, fever or chills. She also reports some redness itching and drainage from both eyes with left greater than right.  Symptoms began 4 days ago.  States her eyes are "crusted" in the mornings and she is having to pry her eyes open with her fingers.  States her eyes feel like they have sand in them.  She denies trauma to her eyes or face.  No visual changes, neck pain, headache, or dizziness.   Home Medications Prior to Admission medications   Medication Sig Start Date End Date Taking? Authorizing Provider  albuterol (PROVENTIL) (2.5 MG/3ML) 0.083% nebulizer solution Take 3 mLs (2.5 mg total) by nebulization every 6 (six) hours as needed for wheezing or shortness of breath. 07/04/21  Yes Hunsucker, Bonna Gains, MD  albuterol (VENTOLIN HFA) 108 (90 Base) MCG/ACT inhaler Inhale 2 puffs into the lungs every 6 (six) hours as needed for wheezing or shortness of breath. 07/04/21   Hunsucker, Bonna Gains, MD  amLODipine (NORVASC) 5 MG tablet TAKE 1 TABLET BY MOUTH DAILY FOR BLOOD  PRESSURE 08/23/21   Lindell Spar, MD  Cholecalciferol (VITAMIN D) 50 MCG (2000 UT) CAPS Take 2,000 Units by mouth daily.    [provider]  citalopram (CELEXA) 20 MG tablet Take 20 mg by mouth daily. 05/16/21   [provider]  cloNIDine (CATAPRES) 0.1 MG tablet TAKE 1 TABLET BY MOUTH DAILY 08/23/21   Lindell Spar, MD  Cyanocobalamin (VITAMIN B 12 PO) Take 1 tablet by mouth daily.    [provider]  DULoxetine (CYMBALTA) 30 MG capsule Take 1 capsule (30 mg total) by mouth 2 (two) times daily. 03/24/21   Lindell Spar, MD  fluticasone-salmeterol [facility] INHUB) 250-50 MCG/ACT AEPB Inhale 1 puff into the lungs in the morning and at bedtime. 07/04/21   Hunsucker, Bonna Gains, MD  furosemide (LASIX) 20 MG tablet TAKE 1 TABLET BY MOUTH DAILY 07/05/21   Lindell Spar, MD  hydrocortisone (ANUSOL-HC) 2.5 % rectal cream Place 1 application rectally 2 (two) times daily. 11/18/20   Lindell Spar, MD  hydrOXYzine (VISTARIL) 50 MG capsule Take 1 capsule (50 mg total) by mouth daily as needed for anxiety (Insomnia). Patient taking differently: Take 50 mg by mouth daily. 03/24/21   Lindell Spar, MD  Misc. Devices MISC Rolling walker - ICD10: M48.061, Z98.1, M17.9 03/24/21   Lindell Spar, MD  omeprazole (PRILOSEC) 20 MG capsule Take 1 capsule (20 mg total) by mouth daily. 07/04/21   Lindell Spar, MD  ondansetron (ZOFRAN-ODT)  4 MG disintegrating tablet Take 1 tablet (4 mg total) by mouth every 8 (eight) hours as needed for up to 10 doses for nausea or vomiting. 06/10/21   Wyvonnia Dusky, MD  potassium chloride SA (KLOR-CON M) 20 MEQ tablet Take 1 tablet (20 mEq total) by mouth daily. TAKE (1) TABLET BY MOUTH ONCE A DAY 07/04/21   Lindell Spar, MD  pregabalin (LYRICA) 150 MG capsule Take 1 capsule (150 mg total) by mouth 2 (two) times daily. 09/06/21   Lindell Spar, MD  Respiratory Therapy Supplies (NEBULIZER/TUBING/MOUTHPIECE) KIT Face mask and tubing for nebulizer machine  07/06/21   Lindell Spar, MD  risperiDONE (RISPERDAL) 0.5 MG tablet Take 0.5 mg by mouth 2 (two) times daily. 05/16/21   [provider]  sucralfate (CARAFATE) 1 g tablet TAKE 1 TABLET BY MOUTH FOUR TIMES DAILY WITH MEALS AND AT BEDTIME 09/05/21   Lindell Spar, MD  tiZANidine (ZANAFLEX) 4 MG tablet Take 1 tablet (4 mg total) by mouth every 8 (eight) hours as needed for muscle spasms. 09/06/21   Lindell Spar, MD      Allergies    Clonopin [clonazepam], Fish allergy, Flexeril [cyclobenzaprine hcl], Ibuprofen, Shellfish allergy, Tylenol [acetaminophen], Ace inhibitors, Tramadol, and Trazodone and nefazodone    Review of Systems   Review of Systems  Constitutional:  Negative for chills and fever.  HENT:  Negative for congestion, sore throat and trouble swallowing.   Eyes:  Positive for discharge, redness and itching. Negative for pain.  Respiratory:  Negative for shortness of breath.   Cardiovascular:  Negative for chest pain.  Gastrointestinal:  Positive for abdominal pain and nausea. Negative for blood in stool, diarrhea and vomiting.  Genitourinary:  Positive for dysuria (intermittent dysuria). Negative for decreased urine volume, difficulty urinating, flank pain, frequency, pelvic pain, vaginal bleeding and vaginal discharge.  Skin:  Negative for rash.  Neurological:  Negative for dizziness, facial asymmetry and headaches.    Physical Exam Updated Vital Signs BP 121/78   Pulse 71   Temp 97.8 F (36.6 C) (Oral)   Resp 16   Ht '4\' 11"'  (1.499 m)   Wt 75.8 kg   LMP 06/07/2020   SpO2 100%   BMI 33.73 kg/m  Physical Exam Vitals and nursing note reviewed.  Constitutional:      General: She is not in acute distress.    Appearance: Normal appearance. She is not ill-appearing.  HENT:     Nose: Nose normal.     Mouth/Throat:     Mouth: Mucous membranes are moist.     Pharynx: No oropharyngeal exudate or posterior oropharyngeal erythema.  Eyes:     General: Lids are  normal. Lids are everted, no foreign bodies appreciated. Vision grossly intact. Gaze aligned appropriately.        Right eye: Discharge present. No foreign body or hordeolum.        Left eye: Discharge present.No foreign body or hordeolum.     Extraocular Movements: Extraocular movements intact.     Right eye: Normal extraocular motion.     Left eye: Normal extraocular motion.     Conjunctiva/sclera:     Right eye: Right conjunctiva is not injected.     Left eye: Left conjunctiva is injected. No exudate.    Pupils: Pupils are equal, round, and reactive to light.  Cardiovascular:     Rate and Rhythm: Normal rate and regular rhythm.     Pulses: Normal pulses.  Pulmonary:  Effort: Pulmonary effort is normal.     Breath sounds: Normal breath sounds.  Chest:     Chest wall: No tenderness.  Abdominal:     General: There is no distension.     Palpations: Abdomen is soft.     Tenderness: There is no abdominal tenderness. There is no right CVA tenderness or left CVA tenderness.  Musculoskeletal:        General: Normal range of motion.     Cervical back: Normal range of motion. No rigidity.     Right lower leg: No edema.     Left lower leg: No edema.  Lymphadenopathy:     Cervical: No cervical adenopathy.  Skin:    General: Skin is warm.     Capillary Refill: Capillary refill takes less than 2 seconds.     Findings: No rash.  Neurological:     General: No focal deficit present.     Mental Status: She is alert.     Sensory: No sensory deficit.     Motor: No weakness.     ED Results / Procedures / Treatments   Labs (all labs ordered are listed, but only abnormal results are displayed) Labs Reviewed  COMPREHENSIVE METABOLIC PANEL - Abnormal; Notable for the following components:      Result Value   Total Protein 8.2 (*)    All other components within normal limits  CBC WITH DIFFERENTIAL/PLATELET - Abnormal; Notable for the following components:   RDW 16.4 (*)    Platelets 726  (*)    All other components within normal limits  LIPASE, BLOOD    EKG None  Radiology No results found.  Procedures Procedures    Medications Ordered in ED Medications  sodium chloride 0.9 % bolus 1,000 mL (0 mLs Intravenous Stopped 10/01/21 1512)  HYDROmorphone (DILAUDID) injection 0.5 mg (0.5 mg Intravenous Given 10/01/21 1511)    ED Course/ Medical Decision Making/ A&P                           Medical Decision Making Amount and/or Complexity of Data Reviewed Labs: ordered.  Risk Prescription drug management.   Patient here with complaint of intermittent right lower abdominal pain nausea and intermittent dysuria.  Symptoms present upon wakening.  Denies vomiting diarrhea fever or chills.  Tolerating food and fluids without difficulty.  Also complaining of itching redness and discharge from both eyes.  Eyes are crusted in the mornings.  On exam, patient well-appearing nontoxic.  Vital signs are reassuring.  She does have exudates along the medial canthus of both eyes with left greater than right.  There is crusting along the eyelashes as well.  No edema of the face, or eyelids.  Gross vision intact.  Denies headache, dizziness or visual change.  Exam of her abdomen is soft and nontender.  She denies abdominal symptoms at this time.  States that she is having knee pain and is scheduled for surgery.  Requesting pain control of her knee pain.  Labs are unremarkable.  No leukocytosis or electrolyte derangement.  Patient has been unable to obtain urine.  No flank pain or urinary discomfort at this time.  She does appear to have some conjunctivitis.  Tobramycin drops dispensed for home use with instructions.  She will continue warm compresses to her eyes.  Although she does not have any abdominal pain on my exam today, and her labs are reassuring, I have discussed importance of close ER follow-up  if her symptoms return or she develops flank pain or fever or vomiting.  She verbalized  understanding and agrees to plan.  She appears appropriate for discharge home.        Final Clinical Impression(s) / ED Diagnoses Final diagnoses:  Right lower quadrant abdominal pain  Acute conjunctivitis of both eyes, unspecified acute conjunctivitis type    Rx / DC Orders ED Discharge Orders     None         Kem Parkinson, PA-C 10/04/21 1322    [name], MD 10/04/21 1445

## 2021-10-05 ENCOUNTER — Inpatient Hospital Stay: Payer: 59 | Attending: Physician Assistant | Admitting: Hematology and Oncology

## 2021-10-05 ENCOUNTER — Other Ambulatory Visit: Payer: Self-pay

## 2021-10-05 VITALS — BP 136/85 | HR 90 | Temp 97.3°F | Resp 18 | Ht 59.0 in | Wt 184.5 lb

## 2021-10-05 DIAGNOSIS — G629 Polyneuropathy, unspecified: Secondary | ICD-10-CM | POA: Insufficient documentation

## 2021-10-05 DIAGNOSIS — Z7901 Long term (current) use of anticoagulants: Secondary | ICD-10-CM | POA: Diagnosis not present

## 2021-10-05 DIAGNOSIS — G6289 Other specified polyneuropathies: Secondary | ICD-10-CM

## 2021-10-05 DIAGNOSIS — Z7951 Long term (current) use of inhaled steroids: Secondary | ICD-10-CM | POA: Insufficient documentation

## 2021-10-05 DIAGNOSIS — D75839 Thrombocytosis, unspecified: Secondary | ICD-10-CM | POA: Diagnosis not present

## 2021-10-05 DIAGNOSIS — Z86718 Personal history of other venous thrombosis and embolism: Secondary | ICD-10-CM | POA: Diagnosis not present

## 2021-10-05 DIAGNOSIS — D473 Essential (hemorrhagic) thrombocythemia: Secondary | ICD-10-CM

## 2021-10-05 DIAGNOSIS — Z79899 Other long term (current) drug therapy: Secondary | ICD-10-CM | POA: Diagnosis not present

## 2021-10-05 MED ORDER — ANAGRELIDE HCL 1 MG PO CAPS: 1.0000 mg | ORAL_CAPSULE | Freq: Two times a day (BID) | ORAL | 6 refills | Status: DC

## 2021-10-05 NOTE — Progress Notes (Signed)
Patient Care Team: Lindell Spar, MD as PCP - General (Internal Medicine)  DIAGNOSIS:  Encounter Diagnoses  Name Primary?   Thrombocytosis    Other polyneuropathy Yes   Essential thrombocytosis (HCC)     CHIEF COMPLIANT: Follow-up of elevated platelets count  INTERVAL HISTORY: Kathryn [name] is a 51 y.o. female is here because of history of blood clots and thrombocytosis she presents to the clinic today for a follow-up. States that she has to have a whole knee replacement on the right and a partial knee replacement on the left. Complains of swelling in feet and legs. States that she doesn't know the reason of the swelling. States that she is not a diabetic.   ALLERGIES:  is allergic to clonopin [clonazepam], fish allergy, flexeril [cyclobenzaprine hcl], ibuprofen, shellfish allergy, tylenol [acetaminophen], ace inhibitors, peanut allergen powder-dnfp, tramadol, and trazodone and nefazodone.  MEDICATIONS:  Current Outpatient Medications  Medication Sig Dispense Refill   albuterol (PROVENTIL) (2.5 MG/3ML) 0.083% nebulizer solution Take 3 mLs (2.5 mg total) by nebulization every 6 (six) hours as needed for wheezing or shortness of breath. 75 mL 11   albuterol (VENTOLIN HFA) 108 (90 Base) MCG/ACT inhaler Inhale 2 puffs into the lungs every 6 (six) hours as needed for wheezing or shortness of breath. 18 g 5   amLODipine (NORVASC) 5 MG tablet TAKE 1 TABLET BY MOUTH DAILY FOR BLOOD PRESSURE 30 tablet 5   anagrelide (AGRYLIN) 1 MG capsule Take 1 capsule (1 mg total) by mouth 2 (two) times daily. 60 capsule 6   Cholecalciferol (VITAMIN D) 50 MCG (2000 UT) CAPS Take 2,000 Units by mouth daily.     citalopram (CELEXA) 20 MG tablet Take 20 mg by mouth daily.     cloNIDine (CATAPRES) 0.1 MG tablet TAKE 1 TABLET BY MOUTH DAILY 30 tablet 5   Cyanocobalamin (VITAMIN B 12 PO) Take 1 tablet by mouth daily.     DULoxetine (CYMBALTA) 30 MG capsule Take 1 capsule (30 mg total) by mouth 2 (two) times  daily. 60 capsule 5   ELIQUIS 5 MG TABS tablet Take 5 mg by mouth 2 (two) times daily.     fluticasone-salmeterol (WIXELA INHUB) 250-50 MCG/ACT AEPB Inhale 1 puff into the lungs in the morning and at bedtime. 180 each 3   furosemide (LASIX) 20 MG tablet TAKE 1 TABLET BY MOUTH DAILY 30 tablet 10   hydrocortisone (ANUSOL-HC) 2.5 % rectal cream Place 1 application rectally 2 (two) times daily. 30 g 0   hydrOXYzine (VISTARIL) 50 MG capsule Take 1 capsule (50 mg total) by mouth daily as needed for anxiety (Insomnia). (Patient taking differently: Take 50 mg by mouth daily.) 30 capsule 5   Misc. Devices MISC Rolling walker - ICD10: M48.061, Z98.1, M17.9 1 each 0   omeprazole (PRILOSEC) 20 MG capsule Take 1 capsule (20 mg total) by mouth daily. 90 capsule 3   ondansetron (ZOFRAN-ODT) 4 MG disintegrating tablet Take 1 tablet (4 mg total) by mouth every 8 (eight) hours as needed for up to 10 doses for nausea or vomiting. 10 tablet 0   potassium chloride SA (KLOR-CON M) 20 MEQ tablet Take 1 tablet (20 mEq total) by mouth daily. TAKE (1) TABLET BY MOUTH ONCE A DAY 30 tablet 1   pregabalin (LYRICA) 150 MG capsule Take 1 capsule (150 mg total) by mouth 2 (two) times daily. 60 capsule 5   Respiratory Therapy Supplies (NEBULIZER/TUBING/MOUTHPIECE) KIT Face mask and tubing for nebulizer machine 1 kit 0  risperiDONE (RISPERDAL) 0.5 MG tablet Take 0.5 mg by mouth 2 (two) times daily.     sucralfate (CARAFATE) 1 g tablet TAKE 1 TABLET BY MOUTH FOUR TIMES DAILY WITH MEALS AND AT BEDTIME 120 tablet 10   tiZANidine (ZANAFLEX) 4 MG tablet Take 1 tablet (4 mg total) by mouth every 8 (eight) hours as needed for muscle spasms. 30 tablet 2   No current facility-administered medications for this visit.    PHYSICAL EXAMINATION: ECOG PERFORMANCE STATUS: 1 - Symptomatic but completely ambulatory  Vitals:   10/05/21 1008  BP: 136/85  Pulse: 90  Resp: 18  Temp: (!) 97.3 F (36.3 C)  SpO2: 100%   Filed Weights    10/05/21 1008  Weight: 184 lb 8 oz (83.7 kg)      LABORATORY DATA:  I have reviewed the data as listed    Latest Ref Rng & Units 10/01/2021    3:09 PM 09/14/2021   10:52 AM 07/02/2021    7:59 AM  CMP  Glucose 70 - 99 mg/dL 93  93  93   BUN 6 - 20 mg/dL '12  8  14   ' Creatinine 0.44 - 1.00 mg/dL 0.67  0.57  0.82   Sodium 135 - 145 mmol/L 136  138  140   Potassium 3.5 - 5.1 mmol/L 4.2  3.7  3.6   Chloride 98 - 111 mmol/L 104  108  107   CO2 22 - 32 mmol/L '25  25  25   ' Calcium 8.9 - 10.3 mg/dL 8.9  8.9  9.0   Total Protein 6.5 - 8.1 g/dL 8.2  7.3  7.0   Total Bilirubin 0.3 - 1.2 mg/dL 0.7  0.5  C 0.2   Alkaline Phos 38 - 126 U/L 88  90  82   AST 15 - 41 U/L 32  18  35   ALT 0 - 44 U/L 25  18  33     C Corrected result    Lab Results  Component Value Date   WBC 10.2 10/01/2021   HGB 13.3 10/01/2021   HCT 40.5 10/01/2021   MCV 86.5 10/01/2021   PLT 726 (H) 10/01/2021   NEUTROABS 6.7 10/01/2021    ASSESSMENT & PLAN:  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 Aspirin: Is not necessary because CALR is not associated with a marked increase in thrombosis.  In addition she is expecting to undergo surgery on her knee and therefore we decided not to initiate aspirin therapy at this time. 2. Platelet lowering therapy: Anagrelide  RTC in 3 months   Orders Placed This Encounter  Procedures   CBC with Differential ([place] Only)    Standing Status:   Future    Standing Expiration Date:   10/06/2022   Ambulatory referral to Neurology    Referral Priority:   Routine    Referral Type:    Consultation    Referral Reason:   Specialty Services Required    Referred to Provider:   Penni Bombard, MD    Requested Specialty:   Neurology    Number of Visits Requested:   1   The patient has a good understanding of the overall plan. she agrees with  it. she will call with any problems that may develop before the next visit here. Total time spent: 30 mins including face to face time and time spent for planning, charting and co-ordination of care   Harriette Ohara, MD 10/05/21    I Gardiner Coins am scribing for Dr. Lindi Adie  I have reviewed the above documentation for accuracy and completeness, and I agree with the above.

## 2021-10-05 NOTE — Assessment & Plan Note (Signed)
08/31/2020: Right leg: DVT involving posterior tibial and right peroneal vein, left leg: Age-indeterminate DVT left gastrocnemius vein Repeat ultrasounds 01/12/2021, 05/23/2021, 07/02/2021: Negative for DVT  Differential diagnosis of thrombocytosis: Primary versus reactive MPN Panel: CALR [id number] Based on the above mutation, it confirms Myeloproliferative neoplasm like essential Thrombocytosis  Recommendation 1. Aspirin 2. Platelet lowering therapy: Anagrelide  RTC in 3 months

## 2021-10-06 ENCOUNTER — Other Ambulatory Visit: Payer: Self-pay | Admitting: Internal Medicine

## 2021-10-06 ENCOUNTER — Emergency Department (HOSPITAL_COMMUNITY): Payer: 59

## 2021-10-06 ENCOUNTER — Emergency Department (HOSPITAL_COMMUNITY)
Admission: EM | Admit: 2021-10-06 | Discharge: 2021-10-06 | Disposition: A | Discharge status: Home / Self Care | Payer: 59 | Attending: Emergency Medicine | Admitting: Emergency Medicine

## 2021-10-06 ENCOUNTER — Other Ambulatory Visit: Payer: Self-pay

## 2021-10-06 ENCOUNTER — Ambulatory Visit (INDEPENDENT_AMBULATORY_CARE_PROVIDER_SITE_OTHER): Payer: 59 | Admitting: Gastroenterology

## 2021-10-06 ENCOUNTER — Encounter (INDEPENDENT_AMBULATORY_CARE_PROVIDER_SITE_OTHER): Payer: Self-pay | Admitting: Gastroenterology

## 2021-10-06 ENCOUNTER — Encounter (HOSPITAL_COMMUNITY): Payer: Self-pay | Admitting: *Deleted

## 2021-10-06 VITALS — BP 113/77 | HR 80 | Temp 97.8°F | Ht 59.0 in | Wt 185.0 lb

## 2021-10-06 DIAGNOSIS — D259 Leiomyoma of uterus, unspecified: Secondary | ICD-10-CM | POA: Diagnosis not present

## 2021-10-06 DIAGNOSIS — Z91010 Allergy to peanuts: Secondary | ICD-10-CM | POA: Insufficient documentation

## 2021-10-06 DIAGNOSIS — A599 Trichomoniasis, unspecified: Secondary | ICD-10-CM | POA: Diagnosis not present

## 2021-10-06 DIAGNOSIS — N390 Urinary tract infection, site not specified: Secondary | ICD-10-CM | POA: Diagnosis not present

## 2021-10-06 DIAGNOSIS — K5900 Constipation, unspecified: Secondary | ICD-10-CM

## 2021-10-06 DIAGNOSIS — I10 Essential (primary) hypertension: Secondary | ICD-10-CM | POA: Insufficient documentation

## 2021-10-06 DIAGNOSIS — K625 Hemorrhage of anus and rectum: Secondary | ICD-10-CM | POA: Diagnosis not present

## 2021-10-06 DIAGNOSIS — Z79899 Other long term (current) drug therapy: Secondary | ICD-10-CM | POA: Insufficient documentation

## 2021-10-06 DIAGNOSIS — J449 Chronic obstructive pulmonary disease, unspecified: Secondary | ICD-10-CM | POA: Diagnosis not present

## 2021-10-06 DIAGNOSIS — N939 Abnormal uterine and vaginal bleeding, unspecified: Secondary | ICD-10-CM

## 2021-10-06 DIAGNOSIS — R1031 Right lower quadrant pain: Secondary | ICD-10-CM | POA: Diagnosis present

## 2021-10-06 DIAGNOSIS — R1030 Lower abdominal pain, unspecified: Secondary | ICD-10-CM | POA: Diagnosis not present

## 2021-10-06 DIAGNOSIS — J45909 Unspecified asthma, uncomplicated: Secondary | ICD-10-CM | POA: Diagnosis not present

## 2021-10-06 DIAGNOSIS — D219 Benign neoplasm of connective and other soft tissue, unspecified: Secondary | ICD-10-CM

## 2021-10-06 LAB — CBC WITH DIFFERENTIAL/PLATELET
Abs Immature Granulocytes: 0.02 10*3/uL (ref 0.00–0.07)
Basophils Absolute: 0 10*3/uL (ref 0.0–0.1)
Basophils Relative: 0 %
Eosinophils Absolute: 0.2 10*3/uL (ref 0.0–0.5)
Eosinophils Relative: 2 %
HCT: 34.5 % — ABNORMAL LOW (ref 36.0–46.0)
Hemoglobin: 11 g/dL — ABNORMAL LOW (ref 12.0–15.0)
Immature Granulocytes: 0 %
Lymphocytes Relative: 35 %
Lymphs Abs: 2.7 10*3/uL (ref 0.7–4.0)
MCH: 28.1 pg (ref 26.0–34.0)
MCHC: 31.9 g/dL (ref 30.0–36.0)
MCV: 88 fL (ref 80.0–100.0)
Monocytes Absolute: 0.6 10*3/uL (ref 0.1–1.0)
Monocytes Relative: 7 %
Neutro Abs: 4.3 10*3/uL (ref 1.7–7.7)
Neutrophils Relative %: 56 %
Platelets: 476 10*3/uL — ABNORMAL HIGH (ref 150–400)
RBC: 3.92 MIL/uL (ref 3.87–5.11)
RDW: 16.9 % — ABNORMAL HIGH (ref 11.5–15.5)
WBC: 7.7 10*3/uL (ref 4.0–10.5)
nRBC: 0 % (ref 0.0–0.2)

## 2021-10-06 LAB — COMPREHENSIVE METABOLIC PANEL
ALT: 20 U/L (ref 0–44)
AST: 20 U/L (ref 15–41)
Albumin: 3.5 g/dL (ref 3.5–5.0)
Alkaline Phosphatase: 69 U/L (ref 38–126)
Anion gap: 6 (ref 5–15)
BUN: 15 mg/dL (ref 6–20)
CO2: 23 mmol/L (ref 22–32)
Calcium: 9.1 mg/dL (ref 8.9–10.3)
Chloride: 106 mmol/L (ref 98–111)
Creatinine, Ser: 0.73 mg/dL (ref 0.44–1.00)
GFR, Estimated: >60 mL/min (ref >60)
Glucose, Bld: 109 mg/dL — ABNORMAL HIGH (ref 70–99)
Potassium: 3.5 mmol/L (ref 3.5–5.1)
Sodium: 135 mmol/L (ref 135–145)
Total Bilirubin: 0.1 mg/dL — ABNORMAL LOW (ref 0.3–1.2)
Total Protein: 7 g/dL (ref 6.5–8.1)

## 2021-10-06 LAB — URINALYSIS, ROUTINE W REFLEX MICROSCOPIC
Bilirubin Urine: NEGATIVE
Glucose, UA: NEGATIVE mg/dL
Hgb urine dipstick: NEGATIVE
Ketones, ur: NEGATIVE mg/dL
Nitrite: NEGATIVE
Protein, ur: NEGATIVE mg/dL
Specific Gravity, Urine: 1.019 (ref 1.005–1.030)
pH: 5 (ref 5.0–8.0)

## 2021-10-06 LAB — WET PREP, GENITAL
Clue Cells Wet Prep HPF POC: NONE SEEN
Sperm: NONE SEEN
WBC, Wet Prep HPF POC: <10 (ref <10)
Yeast Wet Prep HPF POC: NONE SEEN

## 2021-10-06 MED ORDER — FENTANYL CITRATE PF 50 MCG/ML IJ SOSY
50.0000 ug | PREFILLED_SYRINGE | Freq: Once | INTRAMUSCULAR | Status: AC
  Administered 2021-10-06: 50 ug via INTRAVENOUS
  Filled 2021-10-06: qty 1

## 2021-10-06 MED ORDER — SODIUM CHLORIDE 0.9 % IV BOLUS
1000.0000 mL | Freq: Once | INTRAVENOUS | Status: AC
  Administered 2021-10-06: 1000 mL via INTRAVENOUS

## 2021-10-06 MED ORDER — LUBIPROSTONE 24 MCG PO CAPS: 24.0000 ug | ORAL_CAPSULE | Freq: Two times a day (BID) | ORAL | 1 refills | Status: DC

## 2021-10-06 MED ORDER — CEPHALEXIN 500 MG PO CAPS: 500.0000 mg | ORAL_CAPSULE | Freq: Three times a day (TID) | ORAL | 0 refills | Status: AC

## 2021-10-06 MED ORDER — PEG 3350-KCL-NA BICARB-NACL 420 G PO SOLR: 4000.0000 mL | Freq: Once | ORAL | 0 refills | Status: AC

## 2021-10-06 MED ORDER — FENTANYL CITRATE PF 50 MCG/ML IJ SOSY
25.0000 ug | PREFILLED_SYRINGE | Freq: Once | INTRAMUSCULAR | Status: AC
  Administered 2021-10-06: 25 ug via INTRAVENOUS
  Filled 2021-10-06: qty 1

## 2021-10-06 MED ORDER — ONDANSETRON HCL 4 MG/2ML IJ SOLN
4.0000 mg | Freq: Once | INTRAMUSCULAR | Status: AC
  Administered 2021-10-06: 4 mg via INTRAVENOUS
  Filled 2021-10-06: qty 2

## 2021-10-06 NOTE — ED Provider Triage Note (Addendum)
Emergency Medicine Provider Triage Evaluation Note  Kathryn [name] , a 51 y.o. female  was evaluated in triage.  Pt with multiple complaints.  Notes increasing abdominal pain over the last 3 to 4 months, worse over the last few days.  Has not had a full bowel movement in 3 weeks.  Notes dysuria, dark orange urine, RLQ pain, and recent vaginal bleeding.  Bleeding started Saturday.  Recent intercourse on Sunday, with dyspareunia.  Postmenopausal.  Told she may have an ovarian cyst/mass per pt.  Denies fevers, chest pain, shortness of breath.  With mild N/V and chronic back pain.  Review of Systems  Positive:  Negative: See above  Physical Exam  BP 108/77 (BP Location: Right Arm)   Pulse 79   Temp (!) 97.5 F (36.4 C) (Oral)   Resp 20   Ht '4\' 11"'$  (1.499 m)   Wt 83.9 kg   LMP 06/07/2020   SpO2 99%   BMI 37.37 kg/m  Gen:   Awake, no distress   Resp:  Normal effort, not tachypneic MSK:   Moves extremities without difficulty  Other:  RLQ and suprapubic tenderness.  Abdomen soft.  Afebrile.  HR 79 bpm.  Medical Decision Making  Medically screening exam initiated at 2:27 PM.  Appropriate orders placed.  BRISEIS AGUILERA was informed that the remainder of the evaluation will be completed by another provider, this initial triage assessment does not replace that evaluation, and the importance of remaining in the ED until their evaluation is complete.     Prince Rome, PA-C 45/36/46 8032    Prince Rome, PA-C 04/09/81 1438

## 2021-10-06 NOTE — Progress Notes (Unsigned)
Referring Provider: Lindell Spar, MD Primary Care Physician:  Lindell Spar, MD Primary GI Physician: Jenetta Downer  Chief Complaint  Patient presents with   Abdominal Pain    RLQ pain. Went to ED twice for this pain. Having some constipation. States last BM was June 3rd.    HPI:   Kathryn [name] is a 51 y.o. female with past medical history of anxiety, arthritis, asthma, bipolar disorder, COPD, Depression, GERD, HTN, OA, sleep apnea, thrombocytosis, DVT on Eliquis.    Patient presenting today for abdominal pain. Notably with ED visit on 5/31 with RLQ pain, nausea and vomiting as well as dysuria.   History: last seen 07/04/21 for ED f/u of abdominal pain and constipation, at that time she had had recent ED visit 06/10/21 with RLQ pain and loose stools. Denied nausea, vomiting or rectal bleeding. CT A/P with contrast done at that time with no acute abnormality in abd or pelvis. patient reported going as long as 7 days without BM, having occasional toilet tissue hematochezia. She was started on miralax and scheduled for colonoscopy, advised to avoid opiate pain meds, stay well hydarted and eat diet high in fruits, veggies, TSH ordered by never completed. She was also lost to follow up with colonscopy.   Patient seen in ED on 09/14/21 with abdominal pain and constipation, CT A/P without acute findings for abdominal pain, nausea, RIght ovarian cyst noted. [name] thereafter with multiple uterine fibroids, measuring up to 3.9cm, small 1.7cm R ovarian cyst.   Last labs done earlier in June 2023 with normal CBC other than plt count 726k, lipase unremarkable, normal LFTs.   Present:  Patient states that she was supposed to have colonoscopy a while back but had to cancel as she was in the hospital. She states she was not aware that she had a R ovarian cyst. She continues with RLQ pain and constipation. Thinks last BM was on 6/3, she has taken some maalox without good results. She reports she has a lot of  pressure in her rectum, needing to have a BM.  She endorses that she has toilet tissue hematochezia almost everytime she uses the restroom. She reports rectal pain. States she tried miralax after last OV but did not get much results from it. She reports that she drinks a decent amount of water. Does eat a diet high in veggies.   Last Colonoscopy:never Cologuard in feb 2023 was negative  Last Endoscopy:12/25/18- normal hypopharynx. - Normal esophagus. - Z-line irregular, 35 cm from the incisors. - 3 cm hiatal hernia. - Erythematous mucosa in the gastric body. - Normal duodenal bulb and second portion of the duodenum. - No specimens collected.    Past Medical History:  Diagnosis Date   Anxiety    Arthritis    Asthma    Bipolar disorder ([place])    Chronic back pain    COPD (chronic obstructive pulmonary disease) (HCC)    Chronic bronchitis   Depression    Dyspnea    GERD (gastroesophageal reflux disease)    Hypertension    Migraine    Neuropathy    Osteoarthritis of left knee, 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   Single subsegmental pulmonary embolism without acute cor pulmonale ([place]) 06/20/2021   Sleep apnea 04/2020   GETTING A cpap   Suicidal ideation 12/24/2018    Past Surgical History:  Procedure Laterality Date   BACK SURGERY     DILATATION AND CURETTAGE/HYSTEROSCOPY WITH MINERVA N/A 06/09/2020   Procedure: DILATATION AND CURETTAGE/HYSTEROSCOPY WITH MINERVA;  Surgeon: Florian Buff, MD;  Location: [facility];  Service: Gynecology;  Laterality: N/A;   ESOPHAGOGASTRODUODENOSCOPY (EGD) WITH PROPOFOL N/A 12/25/2018   Procedure: ESOPHAGOGASTRODUODENOSCOPY (EGD) WITH PROPOFOL;  Surgeon: Rogene Houston, MD;  Location: [facility];  Service: Endoscopy;  Laterality: N/A;   PATELLA-FEMORAL ARTHROPLASTY Left 10/08/2018   Procedure: PATELLA-FEMORAL ARTHROPLASTY;  Surgeon: Marchia Bond, MD;  Location: [facility];  Service: Orthopedics;  Laterality: Left;   TUBAL LIGATION      Current Outpatient  Medications  Medication Sig Dispense Refill   albuterol (PROVENTIL) (2.5 MG/3ML) 0.083% nebulizer solution Take 3 mLs (2.5 mg total) by nebulization every 6 (six) hours as needed for wheezing or shortness of breath. 75 mL 11   albuterol (VENTOLIN HFA) 108 (90 Base) MCG/ACT inhaler Inhale 2 puffs into the lungs every 6 (six) hours as needed for wheezing or shortness of breath. 18 g 5   amLODipine (NORVASC) 5 MG tablet TAKE 1 TABLET BY MOUTH DAILY FOR BLOOD PRESSURE 30 tablet 5   anagrelide (AGRYLIN) 1 MG capsule Take 1 capsule (1 mg total) by mouth 2 (two) times daily. 60 capsule 6   Cholecalciferol (VITAMIN D) 50 MCG (2000 UT) CAPS Take 2,000 Units by mouth daily.     citalopram (CELEXA) 20 MG tablet Take 20 mg by mouth daily.     cloNIDine (CATAPRES) 0.1 MG tablet TAKE 1 TABLET BY MOUTH DAILY 30 tablet 5   Cyanocobalamin (VITAMIN B 12 PO) Take 1 tablet by mouth daily.     DULoxetine (CYMBALTA) 30 MG capsule Take 1 capsule (30 mg total) by mouth 2 (two) times daily. 60 capsule 5   furosemide (LASIX) 20 MG tablet TAKE 1 TABLET BY MOUTH DAILY 30 tablet 10   hydrocortisone (ANUSOL-HC) 2.5 % rectal cream Place 1 application rectally 2 (two) times daily. 30 g 0   hydrOXYzine (VISTARIL) 50 MG capsule Take 1 capsule (50 mg total) by mouth daily as needed for anxiety (Insomnia). (Patient taking differently: Take 50 mg by mouth daily.) 30 capsule 5   Misc. Devices MISC Rolling walker - ICD10: M48.061, Z98.1, M17.9 1 each 0   omeprazole (PRILOSEC) 20 MG capsule Take 1 capsule (20 mg total) by mouth daily. 90 capsule 3   ondansetron (ZOFRAN-ODT) 4 MG disintegrating tablet Take 1 tablet (4 mg total) by mouth every 8 (eight) hours as needed for up to 10 doses for nausea or vomiting. 10 tablet 0   potassium chloride SA (KLOR-CON M) 20 MEQ tablet TAKE (1) TABLET BY MOUTH ONCE A DAY. 30 tablet 0   pregabalin (LYRICA) 150 MG capsule Take 1 capsule (150 mg total) by mouth 2 (two) times daily. 60 capsule 5    Respiratory Therapy Supplies (NEBULIZER/TUBING/MOUTHPIECE) KIT Face mask and tubing for nebulizer machine 1 kit 0   risperiDONE (RISPERDAL) 0.5 MG tablet Take 0.5 mg by mouth 2 (two) times daily.     tiZANidine (ZANAFLEX) 4 MG tablet Take 1 tablet (4 mg total) by mouth every 8 (eight) hours as needed for muscle spasms. 30 tablet 2   ELIQUIS 5 MG TABS tablet Take 5 mg by mouth 2 (two) times daily. (Patient not taking: Reported on 10/06/2021)     fluticasone-salmeterol (WIXELA INHUB) 250-50 MCG/ACT AEPB Inhale 1 puff into the lungs in the morning and at bedtime. (Patient not taking: Reported on 10/06/2021) 180 each 3   sucralfate (CARAFATE) 1 g tablet TAKE 1 TABLET BY MOUTH FOUR TIMES DAILY WITH MEALS AND AT BEDTIME (Patient not taking: Reported  on 10/06/2021) 120 tablet 10   No current facility-administered medications for this visit.    Allergies as of 10/06/2021 - Review Complete 10/06/2021  Allergen Reaction Noted   Clonopin [clonazepam] Anaphylaxis 06/26/2014   Fish allergy Anaphylaxis, Shortness Of Breath, and Swelling 06/12/2013   Flexeril [cyclobenzaprine hcl] Shortness Of Breath 11/04/2010   Ibuprofen Anaphylaxis, Hives, and Other (See Comments) 11/04/2010   Shellfish allergy Anaphylaxis 10/08/2018   Tylenol [acetaminophen] Anaphylaxis 06/09/2020   Ace inhibitors Cough 05/11/2017   Peanut allergen powder-dnfp  10/05/2021   Tramadol Nausea And Vomiting 06/12/2013   Trazodone and nefazodone Hives 05/19/2020    Family History  Problem Relation Age of Onset   Gout Paternal Grandfather    Cirrhosis Paternal Grandfather    Hypertension Paternal Grandmother    Aneurysm Paternal Grandmother    Cirrhosis Maternal Grandmother    Cirrhosis Maternal Grandfather    Cancer Father    Cirrhosis Father    Cirrhosis Mother    Breast cancer Sister    Hypertension Sister    Bronchitis Daughter    Bronchitis Daughter    Asthma Son    Bronchitis Son    Migraines Neg Hx     Social History    Socioeconomic History   Marital status: Single    Spouse name: Not on file   Number of children: Not on file   Years of education: Not on file   Highest education level: Not on file  Occupational History   Not on file  Tobacco Use   Smoking status: Former    Packs/day: 3.00    Years: 5.00    Total pack years: 15.00    Types: Cigarettes    Passive exposure: Current   Smokeless tobacco: Never   Tobacco comments:    Occasional smoker  Vaping Use   Vaping Use: Never used  Substance and Sexual Activity   Alcohol use: Not Currently   Drug use: Not Currently    Types: Cocaine    Comment: crack  last used 2016   Sexual activity: Yes    Birth control/protection: Surgical    Comment: tubal, ablation  Other Topics Concern   Not on file  Social History Narrative   R handed    Lives with boyfriend   1 Cup of caffeine daily    Social Determinants of Health   Financial Resource Strain: Not on file  Food Insecurity: No Food Insecurity (03/07/2021)   Hunger Vital Sign    Worried About Running Out of Food in the Last Year: Never true    Ran Out of Food in the Last Year: Never true  Transportation Needs: No Transportation Needs (03/07/2021)   PRAPARE - Hydrologist (Medical): No    Lack of Transportation (Non-Medical): No  Physical Activity: Not on file  Stress: Not on file  Social Connections: Not on file    Review of systems General: negative for malaise, night sweats, fever, chills, weight los Neck: Negative for lumps, goiter, pain and significant neck swelling Resp: Negative for cough, wheezing, dyspnea at rest CV: Negative for chest pain, leg swelling, palpitations, orthopnea GI: denies melena, hematochezia, nausea, vomiting, diarrhea, constipation, dysphagia, odyonophagia, early satiety or unintentional weight loss.  MSK: Negative for joint pain or swelling, back pain, and muscle pain. Derm: Negative for itching or rash Psych: Denies  depression, anxiety, memory loss, confusion. No homicidal or suicidal ideation.  Heme: Negative for prolonged bleeding, bruising easily, and swollen nodes. Endocrine: Negative for  cold or heat intolerance, polyuria, polydipsia and goiter. Neuro: negative for tremor, gait imbalance, syncope and seizures. The remainder of the review of systems is noncontributory.  Physical Exam: BP 113/77 (BP Location: Right Arm, Patient Position: Sitting, Cuff Size: Large)   Pulse 80   Temp 97.8 F (36.6 C) (Oral)   Ht '4\' 11"'  (1.499 m)   Wt 185 lb (83.9 kg)   LMP 06/07/2020   BMI 37.37 kg/m  General:   Alert and oriented. No distress noted. Pleasant and cooperative.  Head:  Normocephalic and atraumatic. Eyes:  Conjuctiva clear without scleral icterus. Mouth:  Oral mucosa pink and moist. Good dentition. No lesions. Heart: Normal rate and rhythm, s1 and s2 heart sounds present.  Lungs: Clear lung sounds in all lobes. Respirations equal and unlabored. Abdomen:  +BS, soft, non-tender and non-distended. No rebound or guarding. No HSM or masses noted. Derm: No palmar erythema or jaundice Msk:  Symmetrical without gross deformities. Normal posture. Extremities:  Without edema. Neurologic:  Alert and  oriented x4 Psych:  Alert and cooperative. Normal mood and affect.  Invalid input(s): "6 MONTHS"   ASSESSMENT: Kathryn [name] is a 51 y.o. female presenting today    PLAN:  Stay well hydrated, diet high in fruits, veggies and whole grains 2.  Bowel prep  3. Amitiza 79mg [employer] 4. Schedule colonoscopy-endo 3, 2 day prep 5. Call OB-GYN   Follow Up: 3 months   Samaria Anes L. CAlver Sorrow MSN, APRN, AGNP-C Adult-Gerontology Nurse Practitioner [facility]

## 2021-10-06 NOTE — Discharge Instructions (Addendum)
Please follow-up with your PCP within the next 2 to 3 days for reevaluation and overall continued medical management.  Also continue with your OB/GYN follow-up as soon as possible like we discussed for additional management of your ovarian cyst and fibroids.  Continue with your prescriptions from gastroenterology to treat your constipation.  If you have been unsuccessful within the next few days, return to gastroenterology or the ED for further treatment.  An antibiotic has been sent to your pharmacy, as your labs indicate a likely UTI.  Please take this antibiotic (Keflex) three times per day unless informed otherwise.  Always take with water and plenty of food.  You may also take a probiotic with it to reduce the risk of antibiotic-associated yeast infection.  The gonorrhea and Chlamydia tests are pending, if these are positive you will receive a phone call with further instructions.  The trichomonas, yeast, and BV tests are also pending, if these are positive, you will receive a phone call with further instructions.  Please refrain from sexual intercourse until you have completed your antibiotic.  You may continue to take Tylenol and ibuprofen to manage pain symptoms from home.  Continue to rest and stay hydrated as well  Return to the ED for new or worsening symptoms as discussed.

## 2021-10-06 NOTE — ED Triage Notes (Addendum)
C/o RLQ pain, onset this am, also cramping, constipation, NV, and dysuria. No meds PTA. Onset this am. H/o similar. "Thinks it is related to pancreatitis". Last emesis 0500. Drinking fluids currently. Informed pt of NPO status with rationale. Last BM 6/3. R lower leg swelling noted.

## 2021-10-06 NOTE — ED Provider Notes (Addendum)
[facility] EMERGENCY DEPARTMENT Provider Note   CSN: [id number] Arrival date & time: 10/06/21  1349     History  Chief Complaint  Patient presents with   Abdominal Pain   Constipation    Kathryn [name] is a 51 y.o. female presenting today with multiple complaints.  Has been seen several times for abdominal pain over the last 3 to 4 months, but has noticed increasing pain over the last few days.  Without a full bowel movement in the last 2 to 3 weeks.  Denies bloody bowel movements.  Has noticed increasing dysuria, dark orange urine, RLQ pain, and recent vaginal bleeding.  The bleeding started Saturday.  Recent intercourse on Sunday, with subjective dyspareunia.  Patient is postmenopausal does not normally have vaginal bleeding.  US imaging recently identified a right ovarian cyst and multiple uterine fibroids.  Denies fever, chest pain, neck stiffness, shortness of breath, extremity weakness, dizziness, lightheadedness, or vomiting.  No known Hx of renal calculi.  Not on eliquis since 06/2021 per pt. Accompanied by her granddaughter.  Hx of HTN, asthma, GERD, anxiety, depression, bipolar, COPD, prior DVT, morbid obesity, hemorrhoids, prior PE (06/2021), peripheral neuropathy, essential thrombocytosis.  The history is provided by the patient and medical records.  Abdominal Pain Associated symptoms: constipation   Constipation Associated symptoms: abdominal pain        Home Medications Prior to Admission medications   Medication Sig Start Date End Date Taking? Authorizing Provider  albuterol (VENTOLIN HFA) 108 (90 Base) MCG/ACT inhaler Inhale 2 puffs into the lungs every 6 (six) hours as needed for wheezing or shortness of breath. 07/04/21  Yes Hunsucker, Bonna Gains, MD  amLODipine (NORVASC) 5 MG tablet TAKE 1 TABLET BY MOUTH DAILY FOR BLOOD PRESSURE 08/23/21  Yes Lindell Spar, MD  Cholecalciferol (VITAMIN D) 50 MCG (2000 UT) CAPS Take 2,000 Units by mouth daily.   Yes [provider]  citalopram (CELEXA) 20 MG tablet Take 20 mg by mouth daily. 05/16/21  Yes [provider]  cloNIDine (CATAPRES) 0.1 MG tablet TAKE 1 TABLET BY MOUTH DAILY 08/23/21  Yes Lindell Spar, MD  Cyanocobalamin (VITAMIN B 12 PO) Take 1 tablet by mouth daily.   Yes [provider]  DULoxetine (CYMBALTA) 30 MG capsule Take 1 capsule (30 mg total) by mouth 2 (two) times daily. 03/24/21  Yes Lindell Spar, MD  furosemide (LASIX) 20 MG tablet TAKE 1 TABLET BY MOUTH DAILY 07/05/21  Yes Lindell Spar, MD  hydrocortisone (ANUSOL-HC) 2.5 % rectal cream Place 1 application rectally 2 (two) times daily. 11/18/20  Yes Lindell Spar, MD  hydrOXYzine (VISTARIL) 50 MG capsule Take 1 capsule (50 mg total) by mouth daily as needed for anxiety (Insomnia). Patient taking differently: Take 50 mg by mouth daily. 03/24/21  Yes Lindell Spar, MD  lubiprostone (AMITIZA) 24 MCG capsule Take 1 capsule (24 mcg total) by mouth 2 (two) times daily with a meal. 10/06/21  Yes Carlan, Chelsea L, NP  omeprazole (PRILOSEC) 20 MG capsule Take 1 capsule (20 mg total) by mouth daily. 07/04/21  Yes Lindell Spar, MD  ondansetron (ZOFRAN-ODT) 4 MG disintegrating tablet Take 1 tablet (4 mg total) by mouth every 8 (eight) hours as needed for up to 10 doses for nausea or vomiting. 06/10/21  Yes Trifan, Carola Rhine, MD  potassium chloride SA (KLOR-CON M) 20 MEQ tablet TAKE (1) TABLET BY MOUTH ONCE A DAY. 10/06/21  Yes Lindell Spar, MD  pregabalin [facility])  150 MG capsule Take 1 capsule (150 mg total) by mouth 2 (two) times daily. 09/06/21  Yes Lindell Spar, MD  risperiDONE (RISPERDAL) 0.5 MG tablet Take 0.5 mg by mouth 2 (two) times daily. 05/16/21  Yes [provider]  tiZANidine (ZANAFLEX) 4 MG tablet Take 1 tablet (4 mg total) by mouth every 8 (eight) hours as needed for muscle spasms. 09/06/21  Yes Lindell Spar, MD  albuterol (PROVENTIL) (2.5 MG/3ML) 0.083% nebulizer solution Take 3 mLs (2.5 mg total)  by nebulization every 6 (six) hours as needed for wheezing or shortness of breath. 07/04/21   Hunsucker, Bonna Gains, MD  anagrelide (AGRYLIN) 1 MG capsule Take 1 capsule (1 mg total) by mouth 2 (two) times daily. Patient not taking: Reported on 10/06/2021 10/05/21   Nicholas Lose, MD  ELIQUIS 5 MG TABS tablet Take 5 mg by mouth 2 (two) times daily. Patient not taking: Reported on 10/06/2021 09/26/21   [provider]  fluticasone-salmeterol (WIXELA INHUB) 250-50 MCG/ACT AEPB Inhale 1 puff into the lungs in the morning and at bedtime. Patient not taking: Reported on 10/06/2021 07/04/21   Hunsucker, Bonna Gains, MD  Misc. Devices MISC Rolling walker - ICD10: M48.061, Z98.1, M17.9 03/24/21   Lindell Spar, MD  polyethylene glycol-electrolytes (NULYTELY) 420 g solution Take 4,000 mLs by mouth once for 1 dose. Patient not taking: Reported on 10/06/2021 10/06/21 10/06/21  Gabriel Rung, NP  Respiratory Therapy Supplies (NEBULIZER/TUBING/MOUTHPIECE) KIT Face mask and tubing for nebulizer machine 07/06/21   Lindell Spar, MD  sucralfate (CARAFATE) 1 g tablet TAKE 1 TABLET BY MOUTH FOUR TIMES DAILY WITH MEALS AND AT BEDTIME Patient not taking: Reported on 10/06/2021 09/05/21   Lindell Spar, MD      Allergies    Clonopin [clonazepam], Fish allergy, Flexeril [cyclobenzaprine hcl], Ibuprofen, Shellfish allergy, Tylenol [acetaminophen], Ace inhibitors, Peanut allergen powder-dnfp, Tramadol, and Trazodone and nefazodone    Review of Systems   Review of Systems  Gastrointestinal:  Positive for abdominal pain and constipation.    Physical Exam Updated Vital Signs BP 116/77   Pulse 86   Temp (!) 97.5 F (36.4 C) (Oral)   Resp 20   Ht '4\' 11"'  (1.499 m)   Wt 83.9 kg   LMP 06/07/2020   SpO2 100%   BMI 37.37 kg/m  Physical Exam Vitals and nursing note reviewed.  Constitutional:      General: She is not in acute distress.    Appearance: She is well-developed. She is not ill-appearing or  diaphoretic.  HENT:     Head: Normocephalic and atraumatic.  Eyes:     Conjunctiva/sclera: Conjunctivae normal.  Cardiovascular:     Rate and Rhythm: Normal rate and regular rhythm.     Heart sounds: Normal heart sounds. No murmur heard. Pulmonary:     Effort: Pulmonary effort is normal. No respiratory distress.     Breath sounds: Normal breath sounds.  Chest:     Chest wall: No tenderness.  Abdominal:     General: Abdomen is protuberant. Bowel sounds are normal. There is no distension.     Palpations: Abdomen is soft.     Tenderness: There is abdominal tenderness in the right lower quadrant. There is no right CVA tenderness, left CVA tenderness or guarding. Negative signs include Murphy's sign.  Musculoskeletal:        General: No swelling.     Cervical back: Neck supple.  Skin:    General: Skin is warm and dry.  Capillary Refill: Capillary refill takes less than 2 seconds.  Neurological:     Mental Status: She is alert and oriented to person, place, and time.  Psychiatric:        Mood and Affect: Mood normal.     ED Results / Procedures / Treatments   Labs (all labs ordered are listed, but only abnormal results are displayed) Labs Reviewed  WET PREP, GENITAL - Abnormal; Notable for the following components:      Result Value   Trich, Wet Prep PRESENT (*)    All other components within normal limits  CBC WITH DIFFERENTIAL/PLATELET - Abnormal; Notable for the following components:   Hemoglobin 11.0 (*)    HCT 34.5 (*)    RDW 16.9 (*)    Platelets 476 (*)    All other components within normal limits  COMPREHENSIVE METABOLIC PANEL - Abnormal; Notable for the following components:   Glucose, Bld 109 (*)    Total Bilirubin 0.1 (*)    All other components within normal limits  URINALYSIS, ROUTINE W REFLEX MICROSCOPIC - Abnormal; Notable for the following components:   APPearance HAZY (*)    Leukocytes,Ua MODERATE (*)    Bacteria, UA FEW (*)    All other components  within normal limits  URINE CULTURE  GC/CHLAMYDIA PROBE AMP () NOT AT [facility]    EKG None  Radiology DG Abdomen 1 View  Result Date: 10/06/2021 CLINICAL DATA:  Right lower quadrant pain and constipation. EXAM: ABDOMEN - 1 VIEW COMPARISON:  CT abdomen pelvis dated Sep 14, 2021. FINDINGS: The bowel gas pattern is normal. Moderate colonic stool burden. No radio-opaque calculi or other significant radiographic abnormality are seen. Prior lumbar fusion. IMPRESSION: 1. Moderate colonic stool burden. Electronically Signed   By: Titus Dubin M.D.   On: 10/06/2021 14:27    Procedures Pelvic exam  Date/Time: 10/06/2021 7:58 PM  Performed by: Prince Rome, PA-C Authorized by: Prince Rome, PA-C  Consent: Verbal consent obtained. Risks and benefits: risks, benefits and alternatives were discussed Consent given by: patient Patient understanding: patient states understanding of the procedure being performed Patient consent: the patient's understanding of the procedure matches consent given Procedure consent: procedure consent matches procedure scheduled Patient identity confirmed: verbally with patient and arm band Preparation: Patient was prepped and draped in the usual sterile fashion. Patient tolerance: patient tolerated the procedure well with no immediate complications Comments: No blood, discharge, malodor, CMT, or other abnormalities appreciated on exam.  Swabs collected for wet prep and G/C testing.  Mild uterine generalized tenderness.  No significant mass appreciated.  Known active hx of several fibroids and right ovarian cyst.  Chaperone present.       Medications Ordered in ED Medications  sodium chloride 0.9 % bolus 1,000 mL (0 mLs Intravenous Stopped 10/06/21 1833)  fentaNYL (SUBLIMAZE) injection 50 mcg (50 mcg Intravenous Given 10/06/21 1727)  ondansetron (ZOFRAN) injection 4 mg (4 mg Intravenous Given 10/06/21 1727)  fentaNYL (SUBLIMAZE) injection 25  mcg (25 mcg Intravenous Given 10/06/21 1949)    ED Course/ Medical Decision Making/ A&P                           Medical Decision Making Amount and/or Complexity of Data Reviewed External Data Reviewed: notes. Labs: ordered. Decision-making details documented in ED Course. Radiology: ordered and independent interpretation performed. Decision-making details documented in ED Course. ECG/medicine tests: ordered and independent interpretation performed. Decision-making details documented in ED  Course.  Risk OTC drugs. Prescription drug management.   51 y.o. female presents to the ED for concern of Abdominal Pain and Constipation   This involves an extensive number of treatment options, and is a complaint that carries with it a high risk of complications and morbidity.  The emergent differential diagnosis prior to evaluation includes, but is not limited to: Ovarian cyst rupture, appendicitis, PID, TOA, renal calculi  This is not an exhaustive differential.   Past Medical History / Co-morbidities / Social History: Hx of HTN, asthma, GERD, anxiety, depression, bipolar, COPD, prior DVT, morbid obesity, hemorrhoids, prior PE, prior rectal bleed, peripheral neuropathy, essential thrombocytosis.  Additional History:  Internal and external records from outside source obtained and reviewed including ED visits, gastroenterology, oncology  Physical Exam: Physical exam performed. The pertinent findings include: RLQ and suprapubic tenderness  Lab Tests: I ordered, and personally interpreted labs.  The pertinent results include:   CBC: Mild drop in Hgb from 13.3-11.0 and HCT from 40.5-34.5 in the last 5 days, around same value as three weeks ago, likely benign CMP/BMP: Unremarkable UA: Moderate leukocytes with WBC 21-50, suggestive of UTI Urine culture: Pending --  UPDATE: Negative for growth Wet prep: Pending --  UPDATE: Positive for Trichomonas Gonorrhea/Chlamydia: Pending  Imaging  Studies: I ordered imaging studies including XR abdomen .  I independently visualized and interpreted said imaging.  Pertinent results include: XR abdomen: Moderate stool burden I agree with the radiologist interpretation.  ED Course: Pt well-appearing on exam.  Return to the ED with continuing chronic abdominal pain and 2-3 weeks of constipation.  Still some RLQ and suprapubic tenderness, vaginal spotting, and some N/V.  Postmenopausal.  Abdominal pain has been ongoing for 3 to 4 months.  Constipation for a few weeks.  Other symptoms appeared 5 days ago.  Was evaluated in ED 4 days ago.  Recent CT and [name] indicated a right ovarian cyst and multiple uterine fibroids.  Pt states the symptoms feel exactly the same, no worse, no better.  Tolerating food/fluid without difficulty.  No fevers or neck stiffness, low suspicion for meningitis.  Low suspicion for aortic dissection.  Patient does not meet sepsis or SIRS criteria.  Fluid bolus, Zofran, fentanyl provided.  UA pending.  Plan to reassess. Per further review of the patient's records, she saw GI today in the office for evaluation of same complaints.  Per Scherrie Gerlach NP, pt started on bowel prep, lubiprostone, and is being scheduled for colonoscopy.  Was also recommended to follow-up with OB/GYN.   1600: UA still pending.   Upon reevaluation patient remains the same.  Pain managed in ED.  Today's abdominal XR suggests moderate stool burden.  Low suspicion for bowel obstruction or perforation.  Low suspicion for appendicitis, pancreatitis, or TOA.    1800: UA still pending. Pt with mild improvement.  She admits she picked up the GI medications but has not started them yet, or scheduled her colonoscopy.  States she came from GI appointment to the ED.  Able to pass some stool in ED.  1950: Upon reevaluation, pt has continued to improve.  UA resulted, moderate leukocytes with 21-50 WBCs.  Urine culture pending.  Likely UTI.  Proceed with pelvic exam to  assess for possibility of PID.  Pelvic exam with diffuse mild tenderness, with slight emphasis of RLQ.  Pt again states this is the same tenderness she had in the ED a few days ago.  No cervical motion tenderness, blood, discharge, or pain out  of proportion on exam.  Exam not clinically suggestive of PID.  I suspect the tenderness is likely due to known right ovarian cyst and sizable uterine fibroids.  Wet prep and G/C pending.  2010: Upon reevaluation patient appears much improved.  Pt states she feels well and ready for discharge.  Wet prep, Gonorrhea and Chlamydia test pending -- pt understands this.  Informed pt if wishes to leave, can find her results in her mychart or will be contacted with further instruction.  Encouraged continuation with upcoming OB/GYN appointment.  Also encouraged attempting to manage constipation as instructed from GI.  Pt in agreement.  Pt satisfied with today's encounter.  Pt in NAD in good condition at time of discharge.  Disposition: After consideration of the diagnostic results and the patient's encounter today, I feel that the emergency department workup does not suggest an emergent condition requiring admission or immediate intervention beyond what has been performed at this time.  The patient is safe for discharge and has been instructed to return immediately for worsening symptoms, change in symptoms or any other concerns.  I have reviewed the patients home medicines and have made adjustments as needed.  Discussed course of treatment thoroughly with the patient, whom demonstrated understanding.  Patient in agreement and has no further questions.    I discussed this case with my attending physician Dr. Almyra Free, who agreed with the proposed treatment course and cosigned this note including patient's presenting symptoms, physical exam, and planned diagnostics and interventions.  Attending physician stated agreement with plan or made changes to plan which were implemented.      This chart was dictated using voice recognition software.  Despite best efforts to proofread, errors can occur which can change the documentation meaning.         Final Clinical Impression(s) / ED Diagnoses Final diagnoses:  Constipation, unspecified constipation type  Abnormal uterine bleeding  Leiomyoma  Lower abdominal pain  Urinary tract infection without hematuria, site unspecified  Trichomonas infection    Rx / DC Orders ED Discharge Orders          Ordered    cephALEXin (KEFLEX) 500 MG capsule  3 times daily        10/06/21 2017            UPDATE:  Wet prep positive for trichomonas.  Urine culture negative.  Plan to notify pt and send Flagyl to pharmacy.  G/C still pending.   Prince Rome, PA-C 74/12/87 [id number]    Prince Rome, PA-C 72/09/47 1442    Luna Fuse, MD 10/15/21 [phone]

## 2021-10-06 NOTE — H&P (View-Only) (Signed)
Referring Provider: Lindell Spar, MD Primary Care Physician:  Lindell Spar, MD Primary GI Physician: Jenetta Downer  Chief Complaint  Patient presents with   Abdominal Pain    RLQ pain. Went to ED twice for this pain. Having some constipation. States last BM was June 3rd.    HPI:   Kathryn [name] is a 51 y.o. female with past medical history of anxiety, arthritis, asthma, bipolar disorder, COPD, Depression, GERD, HTN, OA, sleep apnea, thrombocytosis, DVT on Eliquis.    Patient presenting today for abdominal pain. Notably with ED visit on 5/31 with RLQ pain, nausea and vomiting as well as dysuria.   History: last seen 07/04/21 for ED f/u of abdominal pain and constipation, at that time she had had recent ED visit 06/10/21 with RLQ pain and loose stools. Denied nausea, vomiting or rectal bleeding. CT A/P with contrast done at that time with no acute abnormality in abd or pelvis. patient reported going as long as 7 days without BM, having occasional toilet tissue hematochezia. She was started on miralax and scheduled for colonoscopy, advised to avoid opiate pain meds, stay well hydarted and eat diet high in fruits, veggies, TSH ordered by never completed. She was also lost to follow up with colonscopy.   Patient seen in ED on 09/14/21 with abdominal pain and constipation, CT A/P without acute findings for abdominal pain, nausea, RIght ovarian cyst noted. [name] thereafter with multiple uterine fibroids, measuring up to 3.9cm, small 1.7cm R ovarian cyst.   Last labs done earlier in June 2023 with normal CBC other than plt count 726k, lipase unremarkable, normal LFTs.   Present:  Patient states that she was supposed to have colonoscopy a while back but had to cancel as she was in the hospital. She states she was not aware that she had a R ovarian cyst on recent imaging in the ER, she has not followed up with PCP or GYN regarding this. She continues with RLQ pain and constipation. Thinks last BM was on  6/3, she has taken some maalox without good results. She reports she has a lot of pressure in her rectum, needing to have a BM.  She endorses that she has toilet tissue hematochezia almost everytime she uses the restroom. She reports rectal pain. States she tried miralax after last OV but did not get much results from it. She reports that she drinks a decent amount of water. Does eat a diet high in veggies. Denies melena, nausea, vomiting, dysphagia, odynophagia, weight loss or early satiety.   Last Colonoscopy:never Cologuard in feb 2023 was negative  Last Endoscopy:12/25/18- normal hypopharynx. - Normal esophagus. - Z-line irregular, 35 cm from the incisors. - 3 cm hiatal hernia. - Erythematous mucosa in the gastric body. - Normal duodenal bulb and second portion of the duodenum. - No specimens collected.    Past Medical History:  Diagnosis Date   Anxiety    Arthritis    Asthma    Bipolar disorder ([place])    Chronic back pain    COPD (chronic obstructive pulmonary disease) (HCC)    Chronic bronchitis   Depression    Dyspnea    GERD (gastroesophageal reflux disease)    Hypertension    Migraine    Neuropathy    Osteoarthritis of left knee, 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   Single subsegmental pulmonary embolism without acute cor pulmonale ([place]) 06/20/2021   Sleep apnea 04/2020   GETTING A cpap   Suicidal ideation 12/24/2018    Past Surgical  History:  Procedure Laterality Date   BACK SURGERY     DILATATION AND CURETTAGE/HYSTEROSCOPY WITH MINERVA N/A 06/09/2020   Procedure: DILATATION AND CURETTAGE/HYSTEROSCOPY WITH MINERVA;  Surgeon: Florian Buff, MD;  Location: [facility];  Service: Gynecology;  Laterality: N/A;   ESOPHAGOGASTRODUODENOSCOPY (EGD) WITH PROPOFOL N/A 12/25/2018   Procedure: ESOPHAGOGASTRODUODENOSCOPY (EGD) WITH PROPOFOL;  Surgeon: Rogene Houston, MD;  Location: [facility];  Service: Endoscopy;  Laterality: N/A;   PATELLA-FEMORAL ARTHROPLASTY Left 10/08/2018    Procedure: PATELLA-FEMORAL ARTHROPLASTY;  Surgeon: Marchia Bond, MD;  Location: [facility];  Service: Orthopedics;  Laterality: Left;   TUBAL LIGATION      Current Outpatient Medications  Medication Sig Dispense Refill   albuterol (PROVENTIL) (2.5 MG/3ML) 0.083% nebulizer solution Take 3 mLs (2.5 mg total) by nebulization every 6 (six) hours as needed for wheezing or shortness of breath. 75 mL 11   albuterol (VENTOLIN HFA) 108 (90 Base) MCG/ACT inhaler Inhale 2 puffs into the lungs every 6 (six) hours as needed for wheezing or shortness of breath. 18 g 5   amLODipine (NORVASC) 5 MG tablet TAKE 1 TABLET BY MOUTH DAILY FOR BLOOD PRESSURE 30 tablet 5   anagrelide (AGRYLIN) 1 MG capsule Take 1 capsule (1 mg total) by mouth 2 (two) times daily. 60 capsule 6   Cholecalciferol (VITAMIN D) 50 MCG (2000 UT) CAPS Take 2,000 Units by mouth daily.     citalopram (CELEXA) 20 MG tablet Take 20 mg by mouth daily.     cloNIDine (CATAPRES) 0.1 MG tablet TAKE 1 TABLET BY MOUTH DAILY 30 tablet 5   Cyanocobalamin (VITAMIN B 12 PO) Take 1 tablet by mouth daily.     DULoxetine (CYMBALTA) 30 MG capsule Take 1 capsule (30 mg total) by mouth 2 (two) times daily. 60 capsule 5   furosemide (LASIX) 20 MG tablet TAKE 1 TABLET BY MOUTH DAILY 30 tablet 10   hydrocortisone (ANUSOL-HC) 2.5 % rectal cream Place 1 application rectally 2 (two) times daily. 30 g 0   hydrOXYzine (VISTARIL) 50 MG capsule Take 1 capsule (50 mg total) by mouth daily as needed for anxiety (Insomnia). (Patient taking differently: Take 50 mg by mouth daily.) 30 capsule 5   Misc. Devices MISC Rolling walker - ICD10: M48.061, Z98.1, M17.9 1 each 0   omeprazole (PRILOSEC) 20 MG capsule Take 1 capsule (20 mg total) by mouth daily. 90 capsule 3   ondansetron (ZOFRAN-ODT) 4 MG disintegrating tablet Take 1 tablet (4 mg total) by mouth every 8 (eight) hours as needed for up to 10 doses for nausea or vomiting. 10 tablet 0   potassium chloride SA (KLOR-CON M) 20 MEQ  tablet TAKE (1) TABLET BY MOUTH ONCE A DAY. 30 tablet 0   pregabalin (LYRICA) 150 MG capsule Take 1 capsule (150 mg total) by mouth 2 (two) times daily. 60 capsule 5   Respiratory Therapy Supplies (NEBULIZER/TUBING/MOUTHPIECE) KIT Face mask and tubing for nebulizer machine 1 kit 0   risperiDONE (RISPERDAL) 0.5 MG tablet Take 0.5 mg by mouth 2 (two) times daily.     tiZANidine (ZANAFLEX) 4 MG tablet Take 1 tablet (4 mg total) by mouth every 8 (eight) hours as needed for muscle spasms. 30 tablet 2   ELIQUIS 5 MG TABS tablet Take 5 mg by mouth 2 (two) times daily. (Patient not taking: Reported on 10/06/2021)     fluticasone-salmeterol (WIXELA INHUB) 250-50 MCG/ACT AEPB Inhale 1 puff into the lungs in the morning and at bedtime. (Patient not taking: Reported  on 10/06/2021) 180 each 3   sucralfate (CARAFATE) 1 g tablet TAKE 1 TABLET BY MOUTH FOUR TIMES DAILY WITH MEALS AND AT BEDTIME (Patient not taking: Reported on 10/06/2021) 120 tablet 10   No current facility-administered medications for this visit.    Allergies as of 10/06/2021 - Review Complete 10/06/2021  Allergen Reaction Noted   Clonopin [clonazepam] Anaphylaxis 06/26/2014   Fish allergy Anaphylaxis, Shortness Of Breath, and Swelling 06/12/2013   Flexeril [cyclobenzaprine hcl] Shortness Of Breath 11/04/2010   Ibuprofen Anaphylaxis, Hives, and Other (See Comments) 11/04/2010   Shellfish allergy Anaphylaxis 10/08/2018   Tylenol [acetaminophen] Anaphylaxis 06/09/2020   Ace inhibitors Cough 05/11/2017   Peanut allergen powder-dnfp  10/05/2021   Tramadol Nausea And Vomiting 06/12/2013   Trazodone and nefazodone Hives 05/19/2020    Family History  Problem Relation Age of Onset   Gout Paternal Grandfather    Cirrhosis Paternal Grandfather    Hypertension Paternal Grandmother    Aneurysm Paternal Grandmother    Cirrhosis Maternal Grandmother    Cirrhosis Maternal Grandfather    Cancer Father    Cirrhosis Father    Cirrhosis Mother     Breast cancer Sister    Hypertension Sister    Bronchitis Daughter    Bronchitis Daughter    Asthma Son    Bronchitis Son    Migraines Neg Hx     Social History   Socioeconomic History   Marital status: Single    Spouse name: Not on file   Number of children: Not on file   Years of education: Not on file   Highest education level: Not on file  Occupational History   Not on file  Tobacco Use   Smoking status: Former    Packs/day: 3.00    Years: 5.00    Total pack years: 15.00    Types: Cigarettes    Passive exposure: Current   Smokeless tobacco: Never   Tobacco comments:    Occasional smoker  Vaping Use   Vaping Use: Never used  Substance and Sexual Activity   Alcohol use: Not Currently   Drug use: Not Currently    Types: Cocaine    Comment: crack  last used 2016   Sexual activity: Yes    Birth control/protection: Surgical    Comment: tubal, ablation  Other Topics Concern   Not on file  Social History Narrative   R handed    Lives with boyfriend   1 Cup of caffeine daily    Social Determinants of Health   Financial Resource Strain: Not on file  Food Insecurity: No Food Insecurity (03/07/2021)   Hunger Vital Sign    Worried About Running Out of Food in the Last Year: Never true    Ran Out of Food in the Last Year: Never true  Transportation Needs: No Transportation Needs (03/07/2021)   PRAPARE - Hydrologist (Medical): No    Lack of Transportation (Non-Medical): No  Physical Activity: Not on file  Stress: Not on file  Social Connections: Not on file    Review of systems General: negative for malaise, night sweats, fever, chills, weight loss Neck: Negative for lumps, goiter, pain and significant neck swelling Resp: Negative for cough, wheezing, dyspnea at rest CV: Negative for chest pain, leg swelling, palpitations, orthopnea GI: denies melena, nausea, vomiting, diarrhea, dysphagia, odyonophagia, early satiety or  unintentional weight loss. +hematochezia, +constipation MSK: Negative for joint pain or swelling, back pain, and muscle pain. Derm: Negative for itching  or rash Psych: Denies depression, anxiety, memory loss, confusion. No homicidal or suicidal ideation.  Heme: Negative for prolonged bleeding, bruising easily, and swollen nodes. Endocrine: Negative for cold or heat intolerance, polyuria, polydipsia and goiter. Neuro: negative for tremor, gait imbalance, syncope and seizures. The remainder of the review of systems is noncontributory.  Physical Exam: BP 113/77 (BP Location: Right Arm, Patient Position: Sitting, Cuff Size: Large)   Pulse 80   Temp 97.8 F (36.6 C) (Oral)   Ht _0  (1.499 m)   Wt 185 lb (83.9 kg)   LMP 06/07/2020   BMI 37.37 kg/m  General:   Alert and oriented. No distress noted. Pleasant and cooperative.  Head:  Normocephalic and atraumatic. Eyes:  Conjuctiva clear without scleral icterus. Mouth:  Oral mucosa pink and moist. Good dentition. No lesions. Heart: Normal rate and rhythm, s1 and s2 heart sounds present.  Lungs: Clear lung sounds in all lobes. Respirations equal and unlabored. Abdomen:  +BS, soft,  and non-distended. TTP RLQ. No rebound or guarding. No HSM or masses noted. Derm: No palmar erythema or jaundice Msk:  Symmetrical without gross deformities. Normal posture. Extremities:  Without edema. Neurologic:  Alert and  oriented x4 Psych:  Alert and cooperative. Normal mood and affect.  Invalid input(s): "6 MONTHS"   ASSESSMENT: Kathryn [name] is a 51 y.o. female presenting today for constipation and RLQ pain.   Patient with ongoing constipation, rectal bleeding and now RLQ pain, last seen in march and recommended to have colonoscopy and start miralax, however, she was unable to do colonoscopy as she was in the hospital and she did not get any relief from miralax. Recent ED visit with findings of R ovarian cyst for which patient states she was unaware  of, I encouraged her to follow up with GYN regarding this. She reportedly has had no BM since 6/3. RLQ pain likely due in part to both constipation and recently discovered ovarian cyst. I encouraged her again to complete thyroid testing that was ordered in March to rule out underlying hypothyroid as contributor to her constipation. Will send bowel prep to get her cleaned out then start Amitiza 50mg BID thereafter as Miralax did not work in the past. She should stay well hydrated with diet high in fruits, veggies and whole grains, recommended we also get her scheduled for colonoscopy as she continue to have severe constipation and ongoing rectal bleeding. Indications, risks and benefits of procedure discussed in detail with patient. Patient verbalized understanding and is in agreement to proceed with Colonoscopy at this time.   PLAN:  Stay well hydrated, diet high in fruits, veggies and whole grains 2.  Rx Bowel prep  3. Rx Amitiza 221m BID, after bowel prep completed  4. Schedule colonoscopy-endo 3, 2 day prep 5. Call OB-GYN regarding RLQ pain  All questions were answered, patient verbalized understanding and is in agreement with plan as outlined above.   Follow Up: 3 months   Calirose Mccance L. CaAlver SorrowMSN, APRN, AGNP-C Adult-Gerontology Nurse Practitioner [facility]

## 2021-10-06 NOTE — Patient Instructions (Signed)
We will get you scheduled for colonoscopy I have sent a bowel prep to your pharmacy, this will help to get you cleaned out, once you are finished with this, you can start amitiza, this is a medicine for constipation that you will take twice a day Please make sure you are drinking plenty of water and eating diet high in fruits, veggies, whole grains  Please follow up with OB-GYN regarding ovarian cyst

## 2021-10-07 ENCOUNTER — Other Ambulatory Visit (INDEPENDENT_AMBULATORY_CARE_PROVIDER_SITE_OTHER): Payer: Self-pay

## 2021-10-07 ENCOUNTER — Encounter (INDEPENDENT_AMBULATORY_CARE_PROVIDER_SITE_OTHER): Payer: Self-pay

## 2021-10-07 ENCOUNTER — Ambulatory Visit (INDEPENDENT_AMBULATORY_CARE_PROVIDER_SITE_OTHER): Payer: 59

## 2021-10-07 ENCOUNTER — Telehealth (INDEPENDENT_AMBULATORY_CARE_PROVIDER_SITE_OTHER): Payer: Self-pay

## 2021-10-07 DIAGNOSIS — K625 Hemorrhage of anus and rectum: Secondary | ICD-10-CM

## 2021-10-07 DIAGNOSIS — Z111 Encounter for screening for respiratory tuberculosis: Secondary | ICD-10-CM | POA: Diagnosis not present

## 2021-10-07 DIAGNOSIS — K5900 Constipation, unspecified: Secondary | ICD-10-CM

## 2021-10-07 LAB — TB SKIN TEST

## 2021-10-07 NOTE — Telephone Encounter (Signed)
Kathryn [name], CMA  ?

## 2021-10-08 LAB — URINE CULTURE: Culture: NO GROWTH

## 2021-10-09 ENCOUNTER — Telehealth (HOSPITAL_COMMUNITY): Payer: Self-pay | Admitting: Emergency Medicine

## 2021-10-09 DIAGNOSIS — R1030 Lower abdominal pain, unspecified: Secondary | ICD-10-CM | POA: Insufficient documentation

## 2021-10-09 MED ORDER — METRONIDAZOLE 500 MG PO TABS: 500.0000 mg | ORAL_TABLET | Freq: Two times a day (BID) | ORAL | 0 refills | Status: DC

## 2021-10-10 LAB — GC/CHLAMYDIA PROBE AMP (~~LOC~~) NOT AT ARMC
Chlamydia: NEGATIVE
Comment: NEGATIVE
Comment: NORMAL
Neisseria Gonorrhea: NEGATIVE

## 2021-10-11 ENCOUNTER — Encounter (INDEPENDENT_AMBULATORY_CARE_PROVIDER_SITE_OTHER): Payer: Self-pay

## 2021-10-11 ENCOUNTER — Other Ambulatory Visit (INDEPENDENT_AMBULATORY_CARE_PROVIDER_SITE_OTHER): Payer: Self-pay

## 2021-10-12 ENCOUNTER — Telehealth (INDEPENDENT_AMBULATORY_CARE_PROVIDER_SITE_OTHER): Payer: Self-pay

## 2021-10-12 NOTE — Telephone Encounter (Signed)
Per Dr Kathryn [name] dob 02-13-2071 can hold her Eliquis 2 days prior to procedure patient is aware

## 2021-10-12 NOTE — Telephone Encounter (Signed)
Please inform the patient. Thanks

## 2021-10-13 ENCOUNTER — Other Ambulatory Visit (HOSPITAL_COMMUNITY)
Admission: RE | Admit: 2021-10-13 | Discharge: 2021-10-13 | Disposition: A | Discharge status: Home / Self Care | Payer: 59 | Source: Ambulatory Visit | Attending: Gastroenterology | Admitting: Gastroenterology

## 2021-10-13 DIAGNOSIS — K5900 Constipation, unspecified: Secondary | ICD-10-CM | POA: Insufficient documentation

## 2021-10-13 DIAGNOSIS — K625 Hemorrhage of anus and rectum: Secondary | ICD-10-CM | POA: Diagnosis present

## 2021-10-13 LAB — BASIC METABOLIC PANEL
Anion gap: 6 (ref 5–15)
BUN: 10 mg/dL (ref 6–20)
CO2: 22 mmol/L (ref 22–32)
Calcium: 8.5 mg/dL — ABNORMAL LOW (ref 8.9–10.3)
Chloride: 104 mmol/L (ref 98–111)
Creatinine, Ser: 0.62 mg/dL (ref 0.44–1.00)
GFR, Estimated: >60 mL/min (ref >60)
Glucose, Bld: 86 mg/dL (ref 70–99)
Potassium: 3.8 mmol/L (ref 3.5–5.1)
Sodium: 132 mmol/L — ABNORMAL LOW (ref 135–145)

## 2021-10-14 ENCOUNTER — Telehealth: Payer: Self-pay | Admitting: Pulmonary Disease

## 2021-10-14 ENCOUNTER — Ambulatory Visit (HOSPITAL_COMMUNITY): Payer: 59 | Admitting: Anesthesiology

## 2021-10-14 ENCOUNTER — Other Ambulatory Visit: Payer: Self-pay

## 2021-10-14 ENCOUNTER — Telehealth: Payer: Self-pay

## 2021-10-14 ENCOUNTER — Encounter (HOSPITAL_COMMUNITY)
Admission: RE | Disposition: A | Discharge status: Home / Self Care | Payer: Self-pay | Source: Home / Self Care | Attending: Gastroenterology

## 2021-10-14 ENCOUNTER — Ambulatory Visit (HOSPITAL_BASED_OUTPATIENT_CLINIC_OR_DEPARTMENT_OTHER): Payer: 59 | Admitting: Anesthesiology

## 2021-10-14 ENCOUNTER — Ambulatory Visit (HOSPITAL_COMMUNITY)
Admission: RE | Admit: 2021-10-14 | Discharge: 2021-10-14 | Disposition: A | Discharge status: Home / Self Care | Payer: 59 | Attending: Gastroenterology | Admitting: Gastroenterology

## 2021-10-14 ENCOUNTER — Encounter (HOSPITAL_COMMUNITY): Payer: Self-pay | Admitting: Gastroenterology

## 2021-10-14 DIAGNOSIS — Z86718 Personal history of other venous thrombosis and embolism: Secondary | ICD-10-CM | POA: Insufficient documentation

## 2021-10-14 DIAGNOSIS — I10 Essential (primary) hypertension: Secondary | ICD-10-CM

## 2021-10-14 DIAGNOSIS — Z6836 Body mass index (BMI) 36.0-36.9, adult: Secondary | ICD-10-CM | POA: Diagnosis not present

## 2021-10-14 DIAGNOSIS — K219 Gastro-esophageal reflux disease without esophagitis: Secondary | ICD-10-CM | POA: Diagnosis not present

## 2021-10-14 DIAGNOSIS — K5900 Constipation, unspecified: Secondary | ICD-10-CM | POA: Diagnosis not present

## 2021-10-14 DIAGNOSIS — K625 Hemorrhage of anus and rectum: Secondary | ICD-10-CM

## 2021-10-14 DIAGNOSIS — Z7901 Long term (current) use of anticoagulants: Secondary | ICD-10-CM | POA: Diagnosis not present

## 2021-10-14 DIAGNOSIS — F319 Bipolar disorder, unspecified: Secondary | ICD-10-CM | POA: Diagnosis not present

## 2021-10-14 DIAGNOSIS — G4730 Sleep apnea, unspecified: Secondary | ICD-10-CM | POA: Diagnosis not present

## 2021-10-14 DIAGNOSIS — M1990 Unspecified osteoarthritis, unspecified site: Secondary | ICD-10-CM | POA: Diagnosis not present

## 2021-10-14 DIAGNOSIS — Z87891 Personal history of nicotine dependence: Secondary | ICD-10-CM | POA: Insufficient documentation

## 2021-10-14 DIAGNOSIS — J449 Chronic obstructive pulmonary disease, unspecified: Secondary | ICD-10-CM | POA: Diagnosis not present

## 2021-10-14 HISTORY — PX: FLEXIBLE SIGMOIDOSCOPY: SHX5431

## 2021-10-14 SURGERY — SIGMOIDOSCOPY, FLEXIBLE
Anesthesia: General

## 2021-10-14 MED ORDER — PROPOFOL 500 MG/50ML IV EMUL
INTRAVENOUS | Status: DC | PRN
  Administered 2021-10-14: 150 ug/kg/min via INTRAVENOUS

## 2021-10-14 MED ORDER — PROPOFOL 10 MG/ML IV BOLUS
INTRAVENOUS | Status: DC | PRN
  Administered 2021-10-14: 50 mg via INTRAVENOUS
  Administered 2021-10-14: 100 mg via INTRAVENOUS

## 2021-10-14 MED ORDER — LIDOCAINE HCL (CARDIAC) PF 100 MG/5ML IV SOSY
PREFILLED_SYRINGE | INTRAVENOUS | Status: DC | PRN
  Administered 2021-10-14: 50 mg via INTRAVENOUS

## 2021-10-14 MED ORDER — LACTATED RINGERS IV SOLN
INTRAVENOUS | Status: DC
  Administered 2021-10-14: 1000 mL via INTRAVENOUS

## 2021-10-14 NOTE — Telephone Encounter (Signed)
Attempted to call patient but someone picked up the phone and all I was able to hear was the TV in the background, Will try again

## 2021-10-14 NOTE — Telephone Encounter (Signed)
Hey ladies,  I got a message from this patients pharmacy stating that the Advair diskus did not help her. But the Suzie Portela is not covered by her insurance.   Can we run a ticket to see what is covered for this patient.   Can yall send it to [facility] when completed. Im off all next week and I don't want this to sit in my in box for a week.   Thank you ladies

## 2021-10-14 NOTE — Op Note (Signed)
[facility] Patient Name: Kathryn [name] Procedure Date: 10/14/2021 1:51 PM MRN: [id number] Date of Birth: June 30, 1970 Attending MD: Maylon Peppers ,  CSN: [id number] Age: 51 Admit Type: Outpatient Procedure:                Flexible Sigmoidoscopy Indications:              Rectal hemorrhage, Constipation Providers:                Maylon Peppers, Lambert Mody, Bonnetta Barry,                            Technician Referring MD:              Medicines:                Monitored Anesthesia Care Complications:            No immediate complications. Estimated Blood Loss:     Estimated blood loss: none. Procedure:                Pre-Anesthesia Assessment:                           - Prior to the procedure, a History and Physical                            was performed, and patient medications, allergies                            and sensitivities were reviewed. The patient's                            tolerance of previous anesthesia was reviewed.                           - The risks and benefits of the procedure and the                            sedation options and risks were discussed with the                            patient. All questions were answered and informed                            consent was obtained.                           After obtaining informed consent, the scope was                            passed under direct vision. The [id number]-[id number]                            ([id number]) scope was introduced through the anus and                            advanced to the the left transverse colon. After  obtaining informed consent, the scope was passed                            under direct vision.The flexible sigmoidoscopy was                            performed with difficulty due to inadequate bowel                            prep. The patient tolerated the procedure well. Scope In: 2:00:44 PM Scope Out: 2:11:04 PM Total Procedure  Duration: 0 hours 10 minutes 20 seconds  Findings:      The perianal and digital rectal examinations were normal.      A large amount of stool was found in the rectum, in the sigmoid colon,       in the descending colon and in the transverse colon, precluding       visualization. Impression:               - Stool in the rectum, in the sigmoid colon, in the                            descending colon and in the transverse colon.                           - No specimens collected. Moderate Sedation:      Per Anesthesia Care Recommendation:           - Discharge patient to home (ambulatory).                           - Resume previous diet.                           - Repeat colonoscopy at the next available                            appointment for screening purposes. Will need a two                            day prep, but stay on liquid diet for 5 days and                            taking Miralax twice a day besides of Amitza twice                            a day. Procedure Code(s):        --- Professional ---                           [phone], Sigmoidoscopy, flexible; diagnostic,                            including collection of specimen(s) by brushing or  washing, when performed (separate procedure) Diagnosis Code(s):        --- Professional ---                           K62.5, Hemorrhage of anus and rectum                           K59.00, Constipation, unspecified CPT copyright 2019 American Medical Association. All rights reserved. The codes documented in this report are preliminary and upon coder review may  be revised to meet current compliance requirements. Maylon Peppers, MD Maylon Peppers,  10/14/2021 2:18:59 PM This report has been signed electronically. Number of Addenda: 0

## 2021-10-14 NOTE — Anesthesia Procedure Notes (Signed)
Date/Time: 10/14/2021 2:00 PM  Performed by: Orlie Dakin, CRNAPre-anesthesia Checklist: Patient identified, Emergency Drugs available, Suction available and Patient being monitored Patient Re-evaluated:Patient Re-evaluated prior to induction Oxygen Delivery Method: Nasal cannula Induction Type: IV induction Placement Confirmation: positive ETCO2

## 2021-10-14 NOTE — Interval H&P Note (Signed)
History and Physical Interval Note:  10/14/2021 12:46 PM  Kathryn [name]  has presented today for surgery, with the diagnosis of Constipation rectal bleeding.  The various methods of treatment have been discussed with the patient and family. After consideration of risks, benefits and other options for treatment, the patient has consented to  Procedure(s) with comments: COLONOSCOPY WITH PROPOFOL (N/A) - 145 as a surgical intervention.  The patient's history has been reviewed, patient examined, no change in status, stable for surgery.  I have reviewed the patient's chart and labs.  Questions were answered to the patient's satisfaction.     [name]

## 2021-10-14 NOTE — Transfer of Care (Signed)
Immediate Anesthesia Transfer of Care Note  Patient: Kathryn [name]  Procedure(s) Performed: FLEXIBLE SIGMOIDOSCOPY  Patient Location: Endoscopy Unit  Anesthesia Type:General  Level of Consciousness: drowsy  Airway & Oxygen Therapy: Patient Spontanous Breathing  Post-op Assessment: Report given to RN and Post -op Vital signs reviewed and stable  Post vital signs: Reviewed and stable  Last Vitals:  Vitals Value Taken Time  BP    Temp 36.9 C 10/14/21 1415  Pulse 83 10/14/21 1415  Resp 18 10/14/21 1415  SpO2 97 % 10/14/21 1415    Last Pain:  Vitals:   10/14/21 1415  TempSrc: Oral  PainSc:       Patients Stated Pain Goal: 10 (48/40/39 [id number])  Complications: No notable events documented.

## 2021-10-14 NOTE — Anesthesia Preprocedure Evaluation (Signed)
Anesthesia Evaluation  Patient identified by MRN, date of birth, ID band Patient awake    Reviewed: Allergy & Precautions, H&P , NPO status , Patient's Chart, lab work & pertinent test results, reviewed documented beta blocker date and time   Airway Mallampati: II  TM Distance: >3 FB Neck ROM: full    Dental no notable dental hx.    Pulmonary asthma , sleep apnea , COPD, former smoker,    Pulmonary exam normal breath sounds clear to auscultation       Cardiovascular Exercise Tolerance: Good hypertension, negative cardio ROS   Rhythm:regular Rate:Normal     Neuro/Psych  Headaches, PSYCHIATRIC DISORDERS Anxiety Depression Bipolar Disorder  Neuromuscular disease    GI/Hepatic Neg liver ROS, GERD  Medicated,  Endo/Other  Morbid obesity  Renal/GU negative Renal ROS  negative genitourinary   Musculoskeletal   Abdominal   Peds  Hematology negative hematology ROS (+)   Anesthesia Other Findings   Reproductive/Obstetrics negative OB ROS                             Anesthesia Physical Anesthesia Plan  ASA: 3  Anesthesia Plan: General   Post-op Pain Management:    Induction:   PONV Risk Score and Plan: Propofol infusion  Airway Management Planned:   Additional Equipment:   Intra-op Plan:   Post-operative Plan:   Informed Consent: I have reviewed the patients History and Physical, chart, labs and discussed the procedure including the risks, benefits and alternatives for the proposed anesthesia with the patient or authorized representative who has indicated his/her understanding and acceptance.     Dental Advisory Given  Plan Discussed with: CRNA  Anesthesia Plan Comments:         Anesthesia Quick Evaluation

## 2021-10-14 NOTE — Discharge Instructions (Signed)
You are being discharged to home.  Resume your previous diet.  Repeat colonoscopy at the next available appointment for screening purposes. Will need a two day prep, but stay on liquid diet for 5 days and taking Miralax twice a day besides of Amitza twice a day.

## 2021-10-15 NOTE — Anesthesia Postprocedure Evaluation (Signed)
Anesthesia Post Note  Patient: Kathryn [name]  Procedure(s) Performed: [place]  Patient location during evaluation: Phase II Anesthesia Type: General Level of consciousness: awake Pain management: pain level controlled Vital Signs Assessment: post-procedure vital signs reviewed and stable Respiratory status: spontaneous breathing and respiratory function stable Cardiovascular status: blood pressure returned to baseline and stable Postop Assessment: no headache and no apparent nausea or vomiting Anesthetic complications: no Comments: Late entry   No notable events documented.   Last Vitals:  Vitals:   10/14/21 1415 10/14/21 1421  BP:  (!) 113/49  Pulse: 83   Resp: 18   Temp: 36.9 C   SpO2: 97%     Last Pain:  Vitals:   10/14/21 1415  TempSrc: Oral  PainSc: 0-No pain                 Louann Sjogren

## 2021-10-16 ENCOUNTER — Emergency Department (HOSPITAL_COMMUNITY)
Admission: EM | Admit: 2021-10-16 | Discharge: 2021-10-17 | Disposition: A | Discharge status: Other Acute Inpatient Hospital | Payer: 59 | Attending: Emergency Medicine | Admitting: Emergency Medicine

## 2021-10-16 ENCOUNTER — Encounter (HOSPITAL_COMMUNITY): Payer: Self-pay

## 2021-10-16 ENCOUNTER — Other Ambulatory Visit: Payer: Self-pay

## 2021-10-16 DIAGNOSIS — F332 Major depressive disorder, recurrent severe without psychotic features: Secondary | ICD-10-CM | POA: Insufficient documentation

## 2021-10-16 DIAGNOSIS — F411 Generalized anxiety disorder: Secondary | ICD-10-CM | POA: Diagnosis not present

## 2021-10-16 DIAGNOSIS — Z20822 Contact with and (suspected) exposure to covid-19: Secondary | ICD-10-CM | POA: Insufficient documentation

## 2021-10-16 DIAGNOSIS — F1494 Cocaine use, unspecified with cocaine-induced mood disorder: Secondary | ICD-10-CM | POA: Diagnosis present

## 2021-10-16 DIAGNOSIS — I10 Essential (primary) hypertension: Secondary | ICD-10-CM | POA: Diagnosis not present

## 2021-10-16 DIAGNOSIS — F1420 Cocaine dependence, uncomplicated: Secondary | ICD-10-CM | POA: Insufficient documentation

## 2021-10-16 DIAGNOSIS — R45851 Suicidal ideations: Secondary | ICD-10-CM

## 2021-10-16 DIAGNOSIS — Z7901 Long term (current) use of anticoagulants: Secondary | ICD-10-CM | POA: Insufficient documentation

## 2021-10-16 DIAGNOSIS — R456 Violent behavior: Secondary | ICD-10-CM | POA: Diagnosis present

## 2021-10-16 DIAGNOSIS — Z79899 Other long term (current) drug therapy: Secondary | ICD-10-CM | POA: Diagnosis not present

## 2021-10-16 DIAGNOSIS — J449 Chronic obstructive pulmonary disease, unspecified: Secondary | ICD-10-CM | POA: Diagnosis not present

## 2021-10-16 DIAGNOSIS — Z7951 Long term (current) use of inhaled steroids: Secondary | ICD-10-CM | POA: Diagnosis not present

## 2021-10-16 LAB — COMPREHENSIVE METABOLIC PANEL
ALT: 20 U/L (ref 0–44)
AST: 21 U/L (ref 15–41)
Albumin: 4 g/dL (ref 3.5–5.0)
Alkaline Phosphatase: 91 U/L (ref 38–126)
Anion gap: 8 (ref 5–15)
BUN: 9 mg/dL (ref 6–20)
CO2: 25 mmol/L (ref 22–32)
Calcium: 9.6 mg/dL (ref 8.9–10.3)
Chloride: 105 mmol/L (ref 98–111)
Creatinine, Ser: 0.99 mg/dL (ref 0.44–1.00)
GFR, Estimated: >60 mL/min (ref >60)
Glucose, Bld: 98 mg/dL (ref 70–99)
Potassium: 4.2 mmol/L (ref 3.5–5.1)
Sodium: 138 mmol/L (ref 135–145)
Total Bilirubin: 0.6 mg/dL (ref 0.3–1.2)
Total Protein: 8.5 g/dL — ABNORMAL HIGH (ref 6.5–8.1)

## 2021-10-16 LAB — CBC WITH DIFFERENTIAL/PLATELET
Abs Immature Granulocytes: 0.06 10*3/uL (ref 0.00–0.07)
Basophils Absolute: 0.1 10*3/uL (ref 0.0–0.1)
Basophils Relative: 1 %
Eosinophils Absolute: 0.2 10*3/uL (ref 0.0–0.5)
Eosinophils Relative: 2 %
HCT: 38.3 % (ref 36.0–46.0)
Hemoglobin: 12.5 g/dL (ref 12.0–15.0)
Immature Granulocytes: 1 %
Lymphocytes Relative: 29 %
Lymphs Abs: 3.1 10*3/uL (ref 0.7–4.0)
MCH: 27.8 pg (ref 26.0–34.0)
MCHC: 32.6 g/dL (ref 30.0–36.0)
MCV: 85.1 fL (ref 80.0–100.0)
Monocytes Absolute: 0.7 10*3/uL (ref 0.1–1.0)
Monocytes Relative: 6 %
Neutro Abs: 6.7 10*3/uL (ref 1.7–7.7)
Neutrophils Relative %: 61 %
Platelets: 624 10*3/uL — ABNORMAL HIGH (ref 150–400)
RBC: 4.5 MIL/uL (ref 3.87–5.11)
RDW: 16.4 % — ABNORMAL HIGH (ref 11.5–15.5)
WBC: 10.8 10*3/uL — ABNORMAL HIGH (ref 4.0–10.5)
nRBC: 0 % (ref 0.0–0.2)

## 2021-10-16 LAB — ETHANOL: Alcohol, Ethyl (B): <10 mg/dL (ref <10)

## 2021-10-16 MED ORDER — LUBIPROSTONE 24 MCG PO CAPS
24.0000 ug | ORAL_CAPSULE | Freq: Two times a day (BID) | ORAL | Status: DC
  Administered 2021-10-17 (×2): 24 ug via ORAL
  Filled 2021-10-16 (×2): qty 1

## 2021-10-16 MED ORDER — PREGABALIN 75 MG PO CAPS
150.0000 mg | ORAL_CAPSULE | Freq: Two times a day (BID) | ORAL | Status: DC
  Administered 2021-10-16 – 2021-10-17 (×2): 150 mg via ORAL
  Filled 2021-10-16 (×2): qty 2

## 2021-10-16 MED ORDER — AMLODIPINE BESYLATE 5 MG PO TABS
5.0000 mg | ORAL_TABLET | Freq: Every day | ORAL | Status: DC
  Administered 2021-10-16 – 2021-10-17 (×2): 5 mg via ORAL
  Filled 2021-10-16 (×2): qty 1

## 2021-10-16 MED ORDER — CITALOPRAM HYDROBROMIDE 20 MG PO TABS
20.0000 mg | ORAL_TABLET | Freq: Every day | ORAL | Status: DC
  Administered 2021-10-16 – 2021-10-17 (×2): 20 mg via ORAL
  Filled 2021-10-16 (×2): qty 1

## 2021-10-16 MED ORDER — ALBUTEROL SULFATE (2.5 MG/3ML) 0.083% IN NEBU
2.5000 mg | INHALATION_SOLUTION | Freq: Four times a day (QID) | RESPIRATORY_TRACT | Status: DC | PRN
  Administered 2021-10-16: 2.5 mg via RESPIRATORY_TRACT
  Filled 2021-10-16: qty 3

## 2021-10-16 MED ORDER — ALBUTEROL SULFATE HFA 108 (90 BASE) MCG/ACT IN AERS: 2.0000 | INHALATION_SPRAY | Freq: Four times a day (QID) | RESPIRATORY_TRACT | Status: DC | PRN

## 2021-10-16 MED ORDER — ANAGRELIDE HCL 0.5 MG PO CAPS
1.0000 mg | ORAL_CAPSULE | Freq: Two times a day (BID) | ORAL | Status: DC
  Administered 2021-10-16 – 2021-10-17 (×2): 1 mg via ORAL
  Filled 2021-10-16 (×6): qty 2

## 2021-10-16 MED ORDER — RISPERIDONE 1 MG PO TABS
0.5000 mg | ORAL_TABLET | Freq: Two times a day (BID) | ORAL | Status: DC
  Administered 2021-10-16 – 2021-10-17 (×2): 0.5 mg via ORAL
  Filled 2021-10-16 (×2): qty 1

## 2021-10-16 MED ORDER — TIZANIDINE HCL 4 MG PO TABS
4.0000 mg | ORAL_TABLET | Freq: Three times a day (TID) | ORAL | Status: DC | PRN
  Administered 2021-10-16 – 2021-10-17 (×3): 4 mg via ORAL
  Filled 2021-10-16 (×3): qty 1

## 2021-10-16 MED ORDER — APIXABAN 5 MG PO TABS
5.0000 mg | ORAL_TABLET | Freq: Two times a day (BID) | ORAL | Status: DC
  Administered 2021-10-16 – 2021-10-17 (×2): 5 mg via ORAL
  Filled 2021-10-16 (×2): qty 1
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
## 2021-10-16 NOTE — ED Notes (Signed)
TTS in process at this time  
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
## 2021-10-17 ENCOUNTER — Inpatient Hospital Stay (HOSPITAL_COMMUNITY)
Admission: AD | Admit: 2021-10-17 | Discharge: 2021-10-22 | DRG: 885 | Disposition: A | Discharge status: Home / Self Care | Payer: 59 | Source: Intra-hospital | Attending: Emergency Medicine | Admitting: Emergency Medicine

## 2021-10-17 DIAGNOSIS — J449 Chronic obstructive pulmonary disease, unspecified: Secondary | ICD-10-CM | POA: Diagnosis present

## 2021-10-17 DIAGNOSIS — F250 Schizoaffective disorder, bipolar type: Principal | ICD-10-CM | POA: Diagnosis present

## 2021-10-17 DIAGNOSIS — R45851 Suicidal ideations: Secondary | ICD-10-CM | POA: Diagnosis present

## 2021-10-17 DIAGNOSIS — Z86711 Personal history of pulmonary embolism: Secondary | ICD-10-CM | POA: Diagnosis not present

## 2021-10-17 DIAGNOSIS — F1520 Other stimulant dependence, uncomplicated: Secondary | ICD-10-CM

## 2021-10-17 DIAGNOSIS — F411 Generalized anxiety disorder: Secondary | ICD-10-CM | POA: Diagnosis present

## 2021-10-17 DIAGNOSIS — E559 Vitamin D deficiency, unspecified: Secondary | ICD-10-CM | POA: Diagnosis present

## 2021-10-17 DIAGNOSIS — Z79899 Other long term (current) drug therapy: Secondary | ICD-10-CM

## 2021-10-17 DIAGNOSIS — F315 Bipolar disorder, current episode depressed, severe, with psychotic features: Secondary | ICD-10-CM

## 2021-10-17 DIAGNOSIS — I10 Essential (primary) hypertension: Secondary | ICD-10-CM | POA: Diagnosis present

## 2021-10-17 DIAGNOSIS — E538 Deficiency of other specified B group vitamins: Secondary | ICD-10-CM | POA: Diagnosis present

## 2021-10-17 DIAGNOSIS — K219 Gastro-esophageal reflux disease without esophagitis: Secondary | ICD-10-CM | POA: Diagnosis present

## 2021-10-17 DIAGNOSIS — G8929 Other chronic pain: Secondary | ICD-10-CM | POA: Diagnosis present

## 2021-10-17 DIAGNOSIS — F17210 Nicotine dependence, cigarettes, uncomplicated: Secondary | ICD-10-CM | POA: Diagnosis present

## 2021-10-17 DIAGNOSIS — K581 Irritable bowel syndrome with constipation: Secondary | ICD-10-CM | POA: Diagnosis present

## 2021-10-17 DIAGNOSIS — F332 Major depressive disorder, recurrent severe without psychotic features: Secondary | ICD-10-CM | POA: Diagnosis present

## 2021-10-17 DIAGNOSIS — Z634 Disappearance and death of family member: Secondary | ICD-10-CM

## 2021-10-17 DIAGNOSIS — M549 Dorsalgia, unspecified: Secondary | ICD-10-CM | POA: Diagnosis present

## 2021-10-17 DIAGNOSIS — F1510 Other stimulant abuse, uncomplicated: Secondary | ICD-10-CM | POA: Diagnosis present

## 2021-10-17 DIAGNOSIS — Z91411 Personal history of adult psychological abuse: Secondary | ICD-10-CM

## 2021-10-17 DIAGNOSIS — Z7951 Long term (current) use of inhaled steroids: Secondary | ICD-10-CM

## 2021-10-17 DIAGNOSIS — Z91410 Personal history of adult physical and sexual abuse: Secondary | ICD-10-CM | POA: Diagnosis not present

## 2021-10-17 DIAGNOSIS — Z9151 Personal history of suicidal behavior: Secondary | ICD-10-CM

## 2021-10-17 DIAGNOSIS — E119 Type 2 diabetes mellitus without complications: Secondary | ICD-10-CM | POA: Diagnosis present

## 2021-10-17 DIAGNOSIS — Z7901 Long term (current) use of anticoagulants: Secondary | ICD-10-CM

## 2021-10-17 DIAGNOSIS — F410 Panic disorder [episodic paroxysmal anxiety] without agoraphobia: Secondary | ICD-10-CM | POA: Diagnosis present

## 2021-10-17 DIAGNOSIS — Z5900 Homelessness unspecified: Secondary | ICD-10-CM

## 2021-10-17 DIAGNOSIS — E669 Obesity, unspecified: Secondary | ICD-10-CM | POA: Diagnosis present

## 2021-10-17 DIAGNOSIS — Z86718 Personal history of other venous thrombosis and embolism: Secondary | ICD-10-CM

## 2021-10-17 DIAGNOSIS — G4730 Sleep apnea, unspecified: Secondary | ICD-10-CM | POA: Diagnosis present

## 2021-10-17 DIAGNOSIS — G43909 Migraine, unspecified, not intractable, without status migrainosus: Secondary | ICD-10-CM | POA: Diagnosis present

## 2021-10-17 DIAGNOSIS — G4700 Insomnia, unspecified: Secondary | ICD-10-CM | POA: Diagnosis present

## 2021-10-17 DIAGNOSIS — R600 Localized edema: Secondary | ICD-10-CM | POA: Diagnosis present

## 2021-10-17 DIAGNOSIS — F1410 Cocaine abuse, uncomplicated: Secondary | ICD-10-CM

## 2021-10-17 DIAGNOSIS — Z6836 Body mass index (BMI) 36.0-36.9, adult: Secondary | ICD-10-CM

## 2021-10-17 DIAGNOSIS — F251 Schizoaffective disorder, depressive type: Secondary | ICD-10-CM

## 2021-10-17 DIAGNOSIS — A599 Trichomoniasis, unspecified: Secondary | ICD-10-CM | POA: Diagnosis present

## 2021-10-17 DIAGNOSIS — M179 Osteoarthritis of knee, unspecified: Secondary | ICD-10-CM | POA: Diagnosis present

## 2021-10-17 DIAGNOSIS — G629 Polyneuropathy, unspecified: Secondary | ICD-10-CM | POA: Diagnosis present

## 2021-10-17 DIAGNOSIS — M1990 Unspecified osteoarthritis, unspecified site: Secondary | ICD-10-CM | POA: Diagnosis present

## 2021-10-17 DIAGNOSIS — F1420 Cocaine dependence, uncomplicated: Secondary | ICD-10-CM | POA: Diagnosis present

## 2021-10-17 DIAGNOSIS — Z7902 Long term (current) use of antithrombotics/antiplatelets: Secondary | ICD-10-CM

## 2021-10-17 DIAGNOSIS — F1910 Other psychoactive substance abuse, uncomplicated: Secondary | ICD-10-CM

## 2021-10-17 LAB — RESP PANEL BY RT-PCR (FLU A&B, COVID) ARPGX2
Influenza A by PCR: NEGATIVE
Influenza B by PCR: NEGATIVE
SARS Coronavirus 2 by RT PCR: NEGATIVE

## 2021-10-17 MED ORDER — TIZANIDINE HCL 2 MG PO TABS
4.0000 mg | ORAL_TABLET | Freq: Three times a day (TID) | ORAL | Status: DC | PRN
  Administered 2021-10-18 – 2021-10-22 (×8): 4 mg via ORAL
  Filled 2021-10-17 (×9): qty 2

## 2021-10-17 MED ORDER — ANAGRELIDE HCL 0.5 MG PO CAPS
1.0000 mg | ORAL_CAPSULE | Freq: Two times a day (BID) | ORAL | Status: DC
  Administered 2021-10-18 – 2021-10-22 (×9): 1 mg via ORAL
  Filled 2021-10-17 (×13): qty 2

## 2021-10-17 MED ORDER — HYDROXYZINE HCL 50 MG PO TABS
50.0000 mg | ORAL_TABLET | Freq: Every day | ORAL | Status: DC
Start: 2021-10-17 — End: 2021-10-18
  Administered 2021-10-18: 50 mg via ORAL
  Filled 2021-10-17 (×4): qty 1

## 2021-10-17 MED ORDER — ALBUTEROL SULFATE (2.5 MG/3ML) 0.083% IN NEBU: 2.5000 mg | INHALATION_SOLUTION | Freq: Four times a day (QID) | RESPIRATORY_TRACT | Status: DC | PRN

## 2021-10-17 MED ORDER — PREGABALIN 75 MG PO CAPS
150.0000 mg | ORAL_CAPSULE | Freq: Two times a day (BID) | ORAL | Status: DC
  Administered 2021-10-18 – 2021-10-22 (×9): 150 mg via ORAL
  Filled 2021-10-17 (×9): qty 2

## 2021-10-17 MED ORDER — CITALOPRAM HYDROBROMIDE 20 MG PO TABS
20.0000 mg | ORAL_TABLET | Freq: Every day | ORAL | Status: DC
  Administered 2021-10-18 – 2021-10-22 (×5): 20 mg via ORAL
  Filled 2021-10-17 (×8): qty 1

## 2021-10-17 MED ORDER — LUBIPROSTONE 24 MCG PO CAPS
24.0000 ug | ORAL_CAPSULE | Freq: Two times a day (BID) | ORAL | Status: DC
  Administered 2021-10-18 – 2021-10-22 (×8): 24 ug via ORAL
  Filled 2021-10-17 (×14): qty 1

## 2021-10-17 MED ORDER — RISPERIDONE 0.5 MG PO TABS
0.5000 mg | ORAL_TABLET | Freq: Two times a day (BID) | ORAL | Status: DC
  Administered 2021-10-18 – 2021-10-19 (×3): 0.5 mg via ORAL
  Filled 2021-10-17 (×8): qty 1

## 2021-10-17 MED ORDER — APIXABAN 5 MG PO TABS
5.0000 mg | ORAL_TABLET | Freq: Two times a day (BID) | ORAL | Status: DC
  Administered 2021-10-18 – 2021-10-20 (×5): 5 mg via ORAL
  Filled 2021-10-17 (×10): qty 1

## 2021-10-17 MED ORDER — METRONIDAZOLE 500 MG PO TABS
500.0000 mg | ORAL_TABLET | Freq: Two times a day (BID) | ORAL | Status: DC
  Administered 2021-10-18: 500 mg via ORAL
  Filled 2021-10-17 (×5): qty 1

## 2021-10-17 MED ORDER — AMLODIPINE BESYLATE 5 MG PO TABS
5.0000 mg | ORAL_TABLET | Freq: Every day | ORAL | Status: DC
  Administered 2021-10-18 – 2021-10-22 (×5): 5 mg via ORAL
  Filled 2021-10-17 (×8): qty 1

## 2021-10-17 NOTE — Consult Note (Signed)
Telepsych Consultation   Reason for Consult:  Psychiatric Reassessment  Referring Physician: ED Provider Location of Patient:   [facility] Location of Provider: Other: virtual home office  Patient Identification: Kathryn [name] MRN:  [id number] Principal Diagnosis: MDD (major depressive disorder), recurrent episode, severe ([place]) Diagnosis:  Principal Problem:   MDD (major depressive disorder), recurrent episode, severe ([place]) Active Problems:   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[place])   GAD (generalized anxiety disorder)   Total Time spent with patient: 30 minutes  Subjective:   Kathryn [name] is a 51 y.o. female patient admitted with suicidal ideations and plan to cut her throat with a knife.  HPI:   Patient seen via telepsych by this provider; chart reviewed and consulted with Dr. Dwyane Dee on 10/17/21.  On evaluation Kathryn [name] reports she is suicidal because she is homeless and is tired of living this way.  She appears sad and endorses feelings of hopelessness, despair. She has a hx of MDD and her presentation is complicated by crack cocaine abuse.  She presents with suicidal ideations and plan to cut her throat if discharged from the hospital due to homelessness.  Pt directly correlates her suicidal thoughts with not having a place to say.  She's been seen at [facility] for similar concerns within the past year.  Pt has attempted suicide in the past, has limited protective factors and states she does not have any social support.  She has grandchildren that used to live with her and served as protective factors against self harm.  Since they were removed from her home almost a year ago, states she's has no desire to live. She has depression and anxiety concerns, is prescribed citalopram 59m daily and risperidone 0.569mpo BID but states she has not been taking this, states she's been self medicating with crack cocaine.  Her admission UDS is pending but  previous ones completed May 2023 and Nov 2022 were both positive for cocaine.  She is currently homeless and sleeps where ever she can daily.  States she would like to get her grand kids them back but also acknowledges her polysubstance abuse and homelessness are incongruent with her familial goals.   Additionally[facility] her antidepressant medication, citalopram 2067mo daily for an undetermined about of time.  She was restarted on her medication on admission, with the exception of fleeting nausea, she has tolerated them well.  Pt tells me she could contract for safety if she had a place to live.  It is a lower level of care than inpatient admission.  Past Psychiatric History: as outlined above  Risk to Self:  yes Risk to Others:  no Prior Inpatient Therapy: yes  Prior Outpatient Therapy:  yes, with [facility]  Past Medical History:  Past Medical History:  Diagnosis Date   Anxiety    Arthritis    Asthma    Bipolar disorder ([place]  Chronic back pain    COPD (chronic obstructive pulmonary disease) (HCC)    Chronic bronchitis   Depression    Dyspnea    GERD (gastroesophageal reflux disease)    Hypertension    Migraine    Neuropathy    Osteoarthritis of left knee, 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   Single subsegmental pulmonary embolism without acute cor pulmonale ([place]/09/2021   Sleep apnea 04/2020   GETTING A cpap   Suicidal ideation 12/24/2018    Past Surgical History:  Procedure Laterality  Date   BACK SURGERY     DILATATION AND CURETTAGE/HYSTEROSCOPY WITH MINERVA N/A 06/09/2020   Procedure: DILATATION AND CURETTAGE/HYSTEROSCOPY WITH MINERVA;  Surgeon: Florian Buff, MD;  Location: [facility] ORS;  Service: Gynecology;  Laterality: N/A;   ESOPHAGOGASTRODUODENOSCOPY (EGD) WITH PROPOFOL N/A 12/25/2018   Procedure: ESOPHAGOGASTRODUODENOSCOPY (EGD) WITH PROPOFOL;  Surgeon: Rogene Houston, MD;  Location: [facility] ENDO SUITE;  Service: Endoscopy;   Laterality: N/A;   PATELLA-FEMORAL ARTHROPLASTY Left 10/08/2018   Procedure: PATELLA-FEMORAL ARTHROPLASTY;  Surgeon: Marchia Bond, MD;  Location: [facility] ORS;  Service: Orthopedics;  Laterality: Left;   TUBAL LIGATION     Family History:  Family History  Problem Relation Age of Onset   Gout Paternal Grandfather    Cirrhosis Paternal Grandfather    Hypertension Paternal Grandmother    Aneurysm Paternal Grandmother    Cirrhosis Maternal Grandmother    Cirrhosis Maternal Grandfather    Cancer Father    Cirrhosis Father    Cirrhosis Mother    Breast cancer Sister    Hypertension Sister    Bronchitis Daughter    Bronchitis Daughter    Asthma Son    Bronchitis Son    Migraines Neg Hx    Family Psychiatric  History: unknown Social History:  Social History   Substance and Sexual Activity  Alcohol Use Not Currently     Social History   Substance and Sexual Activity  Drug Use Not Currently   Types: Cocaine   Comment: crack  last used June 2023    Social History   Socioeconomic History   Marital status: Single    Spouse name: Not on file   Number of children: Not on file   Years of education: Not on file   Highest education level: Not on file  Occupational History   Not on file  Tobacco Use   Smoking status: Former    Packs/day: 3.00    Years: 5.00    Total pack years: 15.00    Types: Cigarettes    Passive exposure: Current   Smokeless tobacco: Never   Tobacco comments:    Occasional smoker  Vaping Use   Vaping Use: Never used  Substance and Sexual Activity   Alcohol use: Not Currently   Drug use: Not Currently    Types: Cocaine    Comment: crack  last used June 2023   Sexual activity: Yes    Birth control/protection: Surgical    Comment: tubal, ablation  Other Topics Concern   Not on file  Social History Narrative   R handed    Lives with boyfriend   1 Cup of caffeine daily    Social Determinants of Health   Financial Resource Strain: Not on file   Food Insecurity: No Food Insecurity (03/07/2021)   Hunger Vital Sign    Worried About Running Out of Food in the Last Year: Never true    Ran Out of Food in the Last Year: Never true  Transportation Needs: No Transportation Needs (03/07/2021)   PRAPARE - Hydrologist (Medical): No    Lack of Transportation (Non-Medical): No  Physical Activity: Not on file  Stress: Not on file  Social Connections: Not on file   Additional Social History:    Allergies:   Allergies  Allergen Reactions   Clonopin [Clonazepam] Anaphylaxis    Swelling of the tongue and mouth.    Fish Allergy Anaphylaxis, Shortness Of Breath and Swelling   Flexeril [Cyclobenzaprine Hcl] Shortness Of Breath  Ibuprofen Anaphylaxis, Hives and Other (See Comments)   Shellfish Allergy Anaphylaxis   Tylenol [Acetaminophen] Anaphylaxis   Ace Inhibitors Cough   Peanut Allergen Powder-Dnfp    Tramadol Nausea And Vomiting    Upset stomach   Trazodone And Nefazodone Hives    Labs:  Results for orders placed or performed during the hospital encounter of 10/16/21 (from the past 48 hour(s))  Resp Panel by RT-PCR (Flu A&B, Covid) Anterior Nasal Swab     Status: None   Collection Time: 10/16/21  3:57 PM   Specimen: Anterior Nasal Swab  Result Value Ref Range   SARS Coronavirus 2 by RT PCR NEGATIVE NEGATIVE    Comment: (NOTE) SARS-CoV-2 target nucleic acids are NOT DETECTED.  The SARS-CoV-2 RNA is generally detectable in upper respiratory specimens during the acute phase of infection. The lowest concentration of SARS-CoV-2 viral copies this assay can detect is 138 copies/mL. A negative result does not preclude SARS-Cov-2 infection and should not be used as the sole basis for treatment or other patient management decisions. A negative result may occur with  improper specimen collection/handling, submission of specimen other than nasopharyngeal swab, presence of viral mutation(s) within  the areas targeted by this assay, and inadequate number of viral copies(<138 copies/mL). A negative result must be combined with clinical observations, patient history, and epidemiological information. The expected result is Negative.  Fact Sheet for Patients:  EntrepreneurPulse.com.au  Fact Sheet for Healthcare Providers:  IncredibleEmployment.be  This test is no t yet approved or cleared by the [place] FDA and  has been authorized for detection and/or diagnosis of SARS-CoV-2 by FDA under an Emergency Use Authorization (EUA). This EUA will remain  in effect (meaning this test can be used) for the duration of the COVID-19 declaration under Section 564(b)(1) of the Act, 21 U.S.C.section 360bbb-3(b)(1), unless the authorization is terminated  or revoked sooner.       Influenza A by PCR NEGATIVE NEGATIVE   Influenza B by PCR NEGATIVE NEGATIVE    Comment: (NOTE) The Xpert Xpress SARS-CoV-2/FLU/RSV plus assay is intended as an aid in the diagnosis of influenza from Nasopharyngeal swab specimens and should not be used as a sole basis for treatment. Nasal washings and aspirates are unacceptable for Xpert Xpress SARS-CoV-2/FLU/RSV testing.  Fact Sheet for Patients: EntrepreneurPulse.com.au  Fact Sheet for Healthcare Providers: IncredibleEmployment.be  This test is not yet approved or cleared by the [place] FDA and has been authorized for detection and/or diagnosis of SARS-CoV-2 by FDA under an Emergency Use Authorization (EUA). This EUA will remain in effect (meaning this test can be used) for the duration of the COVID-19 declaration under Section 564(b)(1) of the Act, 21 U.S.C. section 360bbb-3(b)(1), unless the authorization is terminated or revoked.  Performed at [facility], [address], [place], [place] [address]   Comprehensive metabolic panel     Status: Abnormal   Collection Time:  10/16/21  4:07 PM  Result Value Ref Range   Sodium 138 135 - 145 mmol/L   Potassium 4.2 3.5 - 5.1 mmol/L   Chloride 105 98 - 111 mmol/L   CO2 25 22 - 32 mmol/L   Glucose, Bld 98 70 - 99 mg/dL    Comment: Glucose reference range applies only to samples taken after fasting for at least 8 hours.   BUN 9 6 - 20 mg/dL   Creatinine, Ser 0.99 0.44 - 1.00 mg/dL   Calcium 9.6 8.9 - 10.3 mg/dL   Total Protein 8.5 (H) 6.5 -  8.1 g/dL   Albumin 4.0 3.5 - 5.0 g/dL   AST 21 15 - 41 U/L   ALT 20 0 - 44 U/L   Alkaline Phosphatase 91 38 - 126 U/L   Total Bilirubin 0.6 0.3 - 1.2 mg/dL   GFR, Estimated >60 >60 mL/min    Comment: (NOTE) Calculated using the CKD-EPI Creatinine Equation (2021)    Anion gap 8 5 - 15    Comment: Performed at [facility], [address] [place], [place] [address]  Ethanol     Status: None   Collection Time: 10/16/21  4:07 PM  Result Value Ref Range   Alcohol, Ethyl (B) <10 <10 mg/dL    Comment: (NOTE) Lowest detectable limit for serum alcohol is 10 mg/dL.  For medical purposes only. Performed at [facility], [address] [place], [place] [address]   CBC with Diff     Status: Abnormal   Collection Time: 10/16/21  4:07 PM  Result Value Ref Range   WBC 10.8 (H) 4.0 - 10.5 K/uL   RBC 4.50 3.87 - 5.11 MIL/uL   Hemoglobin 12.5 12.0 - 15.0 g/dL   HCT 38.3 36.0 - 46.0 %   MCV 85.1 80.0 - 100.0 fL   MCH 27.8 26.0 - 34.0 pg   MCHC 32.6 30.0 - 36.0 g/dL   RDW 16.4 (H) 11.5 - 15.5 %   Platelets 624 (H) 150 - 400 K/uL   nRBC 0.0 0.0 - 0.2 %   Neutrophils Relative % 61 %   Neutro Abs 6.7 1.7 - 7.7 K/uL   Lymphocytes Relative 29 %   Lymphs Abs 3.1 0.7 - 4.0 K/uL   Monocytes Relative 6 %   Monocytes Absolute 0.7 0.1 - 1.0 K/uL   Eosinophils Relative 2 %   Eosinophils Absolute 0.2 0.0 - 0.5 K/uL   Basophils Relative 1 %   Basophils Absolute 0.1 0.0 - 0.1 K/uL   Immature Granulocytes 1 %   Abs Immature Granulocytes 0.06 0.00 - 0.07 K/uL    Comment: Performed at  [facility], [address], [place], [place] [address]    Medications:  Current Facility-Administered Medications  Medication Dose Route Frequency Provider Last Rate Last Admin   albuterol (PROVENTIL) (2.5 MG/3ML) 0.083% nebulizer solution 2.5 mg  2.5 mg Nebulization Q6H PRN Sherrill Raring, PA-C   2.5 mg at 10/16/21 2237   amLODipine (NORVASC) tablet 5 mg  5 mg Oral Daily Sherrill Raring, PA-C   5 mg at 10/17/21 0913   anagrelide (AGRYLIN) capsule 1 mg  1 mg Oral BID Sherrill Raring, PA-C   1 mg at 10/17/21 3748   apixaban (ELIQUIS) tablet 5 mg  5 mg Oral BID Sherrill Raring, PA-C   5 mg at 10/17/21 0913   citalopram (CELEXA) tablet 20 mg  20 mg Oral Daily Sherrill Raring, PA-C   20 mg at 10/17/21 0913   lubiprostone (AMITIZA) capsule 24 mcg  24 mcg Oral BID WC Sherrill Raring, PA-C   24 mcg at 10/17/21 0830   pregabalin (LYRICA) capsule 150 mg  150 mg Oral BID Sherrill Raring, PA-C   150 mg at 10/17/21 0912   risperiDONE (RISPERDAL) tablet 0.5 mg  0.5 mg Oral BID Sherrill Raring, PA-C   0.5 mg at 10/17/21 0913   tiZANidine (ZANAFLEX) tablet 4 mg  4 mg Oral Q8H PRN Sherrill Raring, PA-C   4 mg at 10/17/21 2707   Current Outpatient Medications  Medication Sig Dispense Refill   albuterol (PROVENTIL) (2.5 MG/3ML)  0.083% nebulizer solution Take 3 mLs (2.5 mg total) by nebulization every 6 (six) hours as needed for wheezing or shortness of breath. 75 mL 11   albuterol (VENTOLIN HFA) 108 (90 Base) MCG/ACT inhaler Inhale 2 puffs into the lungs every 6 (six) hours as needed for wheezing or shortness of breath. 18 g 5   amLODipine (NORVASC) 5 MG tablet TAKE 1 TABLET BY MOUTH DAILY FOR BLOOD PRESSURE 30 tablet 5   anagrelide (AGRYLIN) 1 MG capsule Take 1 capsule (1 mg total) by mouth 2 (two) times daily. 60 capsule 6   citalopram (CELEXA) 20 MG tablet Take 20 mg by mouth daily.     cloNIDine (CATAPRES) 0.1 MG tablet TAKE 1 TABLET BY MOUTH DAILY 30 tablet 5   Cyanocobalamin (VITAMIN B 12 PO) Take 1 tablet by mouth daily.     DULoxetine  (CYMBALTA) 30 MG capsule Take 1 capsule (30 mg total) by mouth 2 (two) times daily. 60 capsule 5   ELIQUIS 5 MG TABS tablet Take 5 mg by mouth 2 (two) times daily.     fluticasone-salmeterol (WIXELA INHUB) 250-50 MCG/ACT AEPB Inhale 1 puff into the lungs in the morning and at bedtime. 180 each 3   furosemide (LASIX) 20 MG tablet TAKE 1 TABLET BY MOUTH DAILY 30 tablet 10   hydrocortisone (ANUSOL-HC) 2.5 % rectal cream Place 1 application rectally 2 (two) times daily. 30 g 0   hydrOXYzine (VISTARIL) 50 MG capsule Take 1 capsule (50 mg total) by mouth daily as needed for anxiety (Insomnia). 30 capsule 5   lubiprostone (AMITIZA) 24 MCG capsule Take 1 capsule (24 mcg total) by mouth 2 (two) times daily with a meal. 60 capsule 1   omeprazole (PRILOSEC) 20 MG capsule Take 1 capsule (20 mg total) by mouth daily. 90 capsule 3   ondansetron (ZOFRAN-ODT) 4 MG disintegrating tablet Take 1 tablet (4 mg total) by mouth every 8 (eight) hours as needed for up to 10 doses for nausea or vomiting. 10 tablet 0   potassium chloride SA (KLOR-CON M) 20 MEQ tablet TAKE (1) TABLET BY MOUTH ONCE A DAY. (Patient taking differently: Take 20 mEq by mouth daily.) 30 tablet 0   pregabalin (LYRICA) 150 MG capsule Take 1 capsule (150 mg total) by mouth 2 (two) times daily. 60 capsule 5   risperiDONE (RISPERDAL) 0.5 MG tablet Take 0.5 mg by mouth 2 (two) times daily.     tiZANidine (ZANAFLEX) 4 MG tablet Take 1 tablet (4 mg total) by mouth every 8 (eight) hours as needed for muscle spasms. 30 tablet 2   metroNIDAZOLE (FLAGYL) 500 MG tablet Take 1 tablet (500 mg total) by mouth 2 (two) times daily. One po bid x 7 days (Patient not taking: Reported on 10/16/2021) 14 tablet 0   Misc. Devices MISC Rolling walker - ICD10: M48.061, Z98.1, M17.9 1 each 0   Respiratory Therapy Supplies (NEBULIZER/TUBING/MOUTHPIECE) KIT Face mask and tubing for nebulizer machine 1 kit 0   sucralfate (CARAFATE) 1 g tablet TAKE 1 TABLET BY MOUTH FOUR TIMES DAILY  WITH MEALS AND AT BEDTIME (Patient not taking: Reported on 10/06/2021) 120 tablet 10    Musculoskeletal: Strength & Muscle Tone: within normal limits Gait & Station: normal Patient leans: N/A   Psychiatric Specialty Exam:  Presentation  General Appearance: Appropriate for Environment; Casual  Eye Contact:Good  Speech:Clear and Coherent  Speech Volume:Normal  Handedness:Right   Mood and Affect  Mood:Anxious; Depressed  Affect:Congruent   Thought Process  Thought Processes:Coherent; Goal  Directed  Descriptions of Associations:Circumstantial  Orientation:Full (Time, Place and Person)  Thought Content:Illogical  History of Schizophrenia/Schizoaffective disorder:No  Duration of Psychotic Symptoms:N/A  Hallucinations:Hallucinations: None  Ideas of Reference:None  Suicidal Thoughts:Suicidal Thoughts: Yes, Active SI Active Intent and/or Plan: With Intent; With Plan  Homicidal Thoughts:Homicidal Thoughts: No   Sensorium  Memory:Immediate Good; Recent Good; Remote Good  Judgment:Fair (fair at baseline in the setting of chronic illicit drug usage)  Insight:Fair   Executive Functions  Concentration:Fair  Attention Span:Fair  [place]  Language:Good   Psychomotor Activity  Psychomotor Activity:Psychomotor Activity: Normal   Assets  Assets:Communication Skills; Financial Resources/Insurance   Sleep  Sleep:Sleep: Good Number of Hours of Sleep: 7    Physical Exam: Physical Exam Constitutional:      Appearance: Normal appearance.  Cardiovascular:     Rate and Rhythm: Normal rate.     Pulses: Normal pulses.  Pulmonary:     Effort: Pulmonary effort is normal.  Musculoskeletal:     Cervical back: Normal range of motion.  Neurological:     General: No focal deficit present.     Mental Status: She is alert and oriented to person, place, and time.  Psychiatric:        Attention and Perception: Attention and  perception normal.        Mood and Affect: Mood is anxious and depressed. Affect is labile.        Speech: Speech normal.        Behavior: Behavior normal.        Thought Content: Thought content includes suicidal ideation. Thought content includes suicidal (plan of suicide is contingent on homelessness) plan.        Cognition and Memory: Cognition and memory normal.        Judgment: Judgment is impulsive (impulsive with drug use).    Review of Systems  Eyes: Negative.   Respiratory: Negative.    Cardiovascular: Negative.   Gastrointestinal: Negative.   Genitourinary: Negative.   Musculoskeletal:  Positive for back pain (chronic).  Neurological: Negative.   Endo/Heme/Allergies: Negative.   Psychiatric/Behavioral:  Positive for depression, substance abuse and suicidal ideas. The patient is nervous/anxious.    Blood pressure 115/76, pulse 76, temperature 98.4 F (36.9 C), temperature source Oral, resp. rate 18, height 4' 11.5" (1.511 m), weight 84.4 kg, last menstrual period 06/07/2020, SpO2 98 %. Body mass index is 36.94 kg/m.  Treatment Plan Summary: Patient with hx of MDD and crack cocaine abuse presents with suicidal ideations and plan to cut her throat with a knife.  Her presentation is complicated by her homelessness, but she has a hx of prior suicide attempt, limited protective factors and will not contract for safety. Will recommend inpatient admission when she can take her antidepressant medications and monitored for safety.    Daily contact with patient to assess and evaluate symptoms and progress in treatment and Medication management. Labs reviewed and are WNL to restart/continue psychiatric medications. EKG does not show prolonged QT/Qtc intervals.   Disposition: Recommend psychiatric Inpatient admission when medically cleared.  This service was provided via telemedicine using a 2-way, interactive audio and video technology.  Names of all persons participating in this  telemedicine service and their role in this encounter. Name: Kathryn [name] Role: Patient  Name: Merlyn Lot Role: PMHNP   Mallie Darting, NP 10/17/2021 12:43 PM

## 2021-10-17 NOTE — ED Provider Notes (Signed)
Emergency Medicine Observation Re-evaluation Note  Kathryn [name] is a 51 y.o. female, seen on rounds today.  Pt initially presented to the ED for complaints of Suicidal Currently, the patient is sleeping.  Physical Exam  BP 114/74 (BP Location: Left Arm)   Pulse 74   Temp 98.2 F (36.8 C) (Oral)   Resp 18   Ht 4' 11.5" (1.511 m)   Wt 84.4 kg   LMP 06/07/2020   SpO2 98%   BMI 36.94 kg/m  Physical Exam General: Sleeping Cardiac: Extremities well-perfused Lungs: Breathing is unlabored Psych: Deferred  ED Course / MDM  EKG:EKG Interpretation  Date/Time:  Sunday October 16 2021 16:15:47 EDT Ventricular Rate:  93 PR Interval:  140 QRS Duration: 82 QT Interval:  386 QTC Calculation: 479 R Axis:   94 Text Interpretation: Normal sinus rhythm Rightward axis Borderline ECG When compared with ECG of 19-Jun-2021 09:36, No significant change was found No significant change since last tracing Confirmed by [name] [phone]) on 10/16/2021 4:48:52 PM  I have reviewed the labs performed to date as well as medications administered while in observation.  Recent changes in the last 24 hours include none.  Plan  Current plan is for inpatient psychiatric admission.  VIOLETTA LAVALLE is not under involuntary commitment.     Godfrey Pick, MD 10/17/21 1728

## 2021-10-17 NOTE — Progress Notes (Signed)
Pt was accepted to [facility] 10/17/21 at 2100; Bed Assignment 405-1  Pt meets inpatient criteria per Merlyn Lot, NP  Attending Physician will be Dr. Janine Limbo  Report can be called to: Adult unit: [phone]  Pt can arrive after 2100  Care Team notified: Merlyn Lot, NP, [facility] Lynnda Shields, RN, Alinda Sierras, RN, and Night Shift [facility] Wynonia Hazard, RN.  Nadara Mode, LCSWA 10/17/2021 @ 7:59 PM

## 2021-10-17 NOTE — ED Notes (Signed)
Safe transport called at this time. Kathryn [name]

## 2021-10-17 NOTE — Telephone Encounter (Signed)
ATC patient at [phone] line rang once and then gave me busy signal. Will close this encounter after 2 attempts.

## 2021-10-18 ENCOUNTER — Other Ambulatory Visit: Payer: Self-pay

## 2021-10-18 ENCOUNTER — Encounter (HOSPITAL_COMMUNITY): Payer: Self-pay | Admitting: Psychiatry

## 2021-10-18 DIAGNOSIS — F315 Bipolar disorder, current episode depressed, severe, with psychotic features: Secondary | ICD-10-CM

## 2021-10-18 DIAGNOSIS — F1410 Cocaine abuse, uncomplicated: Secondary | ICD-10-CM

## 2021-10-18 DIAGNOSIS — F250 Schizoaffective disorder, bipolar type: Secondary | ICD-10-CM

## 2021-10-18 DIAGNOSIS — F251 Schizoaffective disorder, depressive type: Secondary | ICD-10-CM

## 2021-10-18 DIAGNOSIS — F1520 Other stimulant dependence, uncomplicated: Secondary | ICD-10-CM

## 2021-10-18 LAB — URINALYSIS, COMPLETE (UACMP) WITH MICROSCOPIC
Bacteria, UA: NONE SEEN
Bilirubin Urine: NEGATIVE
Glucose, UA: NEGATIVE mg/dL
Hgb urine dipstick: NEGATIVE
Ketones, ur: NEGATIVE mg/dL
Nitrite: NEGATIVE
Protein, ur: NEGATIVE mg/dL
Specific Gravity, Urine: 1.016 (ref 1.005–1.030)
pH: 7 (ref 5.0–8.0)

## 2021-10-18 MED ORDER — METRONIDAZOLE 500 MG PO TABS
500.0000 mg | ORAL_TABLET | Freq: Two times a day (BID) | ORAL | Status: DC
  Administered 2021-10-18 – 2021-10-22 (×8): 500 mg via ORAL
  Filled 2021-10-18 (×12): qty 1

## 2021-10-18 MED ORDER — HYDROXYZINE HCL 25 MG PO TABS
25.0000 mg | ORAL_TABLET | Freq: Three times a day (TID) | ORAL | Status: DC | PRN
  Administered 2021-10-18 – 2021-10-19 (×2): 25 mg via ORAL
  Filled 2021-10-18 (×2): qty 1

## 2021-10-18 MED ORDER — MOMETASONE FURO-FORMOTEROL FUM 200-5 MCG/ACT IN AERO
2.0000 | INHALATION_SPRAY | Freq: Two times a day (BID) | RESPIRATORY_TRACT | Status: DC
  Administered 2021-10-18 – 2021-10-22 (×9): 2 via RESPIRATORY_TRACT
  Filled 2021-10-18: qty 8.8

## 2021-10-18 MED ORDER — PANTOPRAZOLE SODIUM 40 MG PO TBEC
40.0000 mg | DELAYED_RELEASE_TABLET | Freq: Every day | ORAL | Status: DC
  Administered 2021-10-18 – 2021-10-22 (×5): 40 mg via ORAL
  Filled 2021-10-18 (×7): qty 1

## 2021-10-18 MED ORDER — MELATONIN 3 MG PO TABS
3.0000 mg | ORAL_TABLET | Freq: Every evening | ORAL | Status: DC | PRN
  Administered 2021-10-18: 3 mg via ORAL
  Filled 2021-10-18 (×2): qty 1

## 2021-10-18 MED ORDER — ALBUTEROL SULFATE HFA 108 (90 BASE) MCG/ACT IN AERS: 2.0000 | INHALATION_SPRAY | Freq: Four times a day (QID) | RESPIRATORY_TRACT | Status: DC | PRN
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
## 2021-10-18 NOTE — Tx Team (Signed)
Initial Treatment Plan 10/18/2021 1:26 AM [name] PFY:[id number]    PATIENT STRESSORS: Financial difficulties   Health problems   Other: Homeless     PATIENT STRENGTHS: Marketing executive fund of knowledge  Motivation for treatment/growth  Religious Affiliation    PATIENT IDENTIFIED PROBLEMS: Depression  Suicidal ideation    "Get back on track"  "Help me get in somewhere"             DISCHARGE CRITERIA:  Improved stabilization in mood, thinking, and/or behavior Need for constant or close observation no longer present Reduction of life-threatening or endangering symptoms to within safe limits Verbal commitment to aftercare and medication compliance  PRELIMINARY DISCHARGE PLAN: Outpatient therapy  PATIENT/FAMILY INVOLVEMENT: This treatment plan has been presented to and reviewed with the patient, Kathryn [name].  The patient and family have been given the opportunity to ask questions and make suggestions.  Windell Moment, RN 10/18/2021, 1:26 AM
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
## 2021-10-18 NOTE — [facility] Suicide Risk Assessment (Signed)
Suicide Risk Assessment  Admission Assessment    [facility] Admission Suicide Risk Assessment   Nursing information obtained from:  Patient Demographic factors:  Low socioeconomic status Current Mental Status:  Suicidal ideation indicated by patient Loss Factors:  Decline in physical health, Financial problems / change in socioeconomic status Historical Factors:  Prior suicide attempts Risk Reduction Factors:  Religious beliefs about death  Total Time spent with patient: 1.5 hours Principal Problem: Schizoaffective disorder, bipolar type ([place]) Diagnosis:  Principal Problem:   Schizoaffective disorder, bipolar type ([place]) Active Problems:   Hypertension   OA (osteoarthritis) of knee   Insomnia   GERD (gastroesophageal reflux disease)   GAD (generalized anxiety disorder)   Cocaine use disorder (HCC)  History of Present Illness: Kathryn [name] is a 51 y.o. female with past history significant for bipolar d/o, GAD, MDD, cocaine dependence, hypertension, asthma, COPD, T2DM, PE on Eliquis, GERD as well as a reported history of schizophrenia who presented to the [facility] hospital due to worsening depressive symptoms and SI, with a plan to use a knife to cut her neck. Pt was transferred to this [facility] voluntarily for treatment and stabilization of her mood.  Continued Clinical Symptoms:  Pt reports worsening feelings of hopelessness, helplessness, worthlessness, irritability, insomnia, frustration, poor appetite, anxiety, crying spells & anhedonia for the week. She also reports +AH of whispers, and +VH of shadows and paranoia, which happens mostly when she is using cocaine. She reports thought insertion and thought withdrawal at baseline even without the use of substances of abuse.  She reports that she has been sober on and off, but her last sobriety was for 6-7 months prior to her relapse on Cocaine last week. She reports her current stressors as mostly financial in nature, and states that she is  currently homeless. Pt describes manic type episodes with happened for a week prior to this admission, and states that she was going at "a tornado speed", engaging in risk taking behaviors, using lots of drugs, had a lot of energy, and not feeling tired or hungry, but then crashed last week, leading to this hospitalization. Pt's symptoms place her at high risk of danger to self and continuous hospitalization is needed to treat and stabilize mood.  Alcohol Use Disorder Identification Test Final Score (AUDIT): 1 The "Alcohol Use Disorders Identification Test", Guidelines for Use in Primary Care, Second Edition.  World Pharmacologist [facility]). Score between 0-7:  no or low risk or alcohol related problems. Score between 8-15:  moderate risk of alcohol related problems. Score between 16-19:  high risk of alcohol related problems. Score 20 or above:  warrants further diagnostic evaluation for alcohol dependence and treatment.  CLINICAL FACTORS:   Severe Anxiety and/or Agitation More than one psychiatric diagnosis Previous Psychiatric Diagnoses and Treatments Medical Diagnoses and Treatments/Surgeries  Musculoskeletal: Strength & Muscle Tone: within normal limits Gait & Station: normal Patient leans: N/A  Psychiatric Specialty Exam:  Presentation  General Appearance: Appropriate for Environment; Fairly Groomed  Eye Contact:Fair  Speech:Clear and Coherent  Speech Volume:Normal  Handedness:Right   Mood and Affect  Mood:Depressed  Affect:Congruent   Thought Process  Thought Processes:Coherent  Descriptions of Associations:Intact  Orientation:Full (Time, Place and Person)  Thought Content:Logical  History of Schizophrenia/Schizoaffective disorder:No  Duration of Psychotic Symptoms:N/A  Hallucinations:Hallucinations: Auditory; Visual Description of Auditory Hallucinations: whispers Description of Visual Hallucinations: shadows  Ideas of Reference:None  Suicidal  Thoughts:Suicidal Thoughts: No SI Active Intent and/or Plan: With Intent; With Plan  Homicidal Thoughts:Homicidal  Thoughts: No   Sensorium  Memory:Immediate Good  Judgment:Fair  Insight:Fair   Executive Functions  Concentration:Fair  Attention Span:Fair  [place]   Psychomotor Activity  Psychomotor Activity:Psychomotor Activity: Normal  Assets  Assets:Communication Skills; Financial Resources/Insurance  Sleep  Sleep:Sleep: Poor Number of Hours of Sleep: 7   Physical Exam: Physical Exam Constitutional:      Appearance: Normal appearance.  HENT:     Head: Normocephalic.     Nose: Nose normal. No congestion.  Eyes:     Pupils: Pupils are equal, round, and reactive to light.  Musculoskeletal:     Cervical back: Normal range of motion.  Neurological:     Mental Status: She is alert and oriented to person, place, and time.     Sensory: No sensory deficit.    Review of Systems  Constitutional: Negative.  Negative for fever.  HENT: Negative.    Eyes: Negative.   Respiratory: Negative.    Cardiovascular: Negative.   Gastrointestinal: Negative.   Genitourinary: Negative.   Musculoskeletal: Negative.   Skin: Negative.   Neurological: Negative.   Psychiatric/Behavioral:  Positive for depression, hallucinations and substance abuse. Negative for memory loss and suicidal ideas. The patient is nervous/anxious and has insomnia.    Blood pressure 130/72, pulse 87, temperature 98.5 F (36.9 C), temperature source Oral, resp. rate 18, height 4' 11.5" (1.511 m), weight 83.2 kg, last menstrual period 06/07/2020, SpO2 100 %. Body mass index is 36.42 kg/m.   COGNITIVE FEATURES THAT CONTRIBUTE TO RISK:  None    SUICIDE RISK:   Moderate:  Frequent suicidal ideation with limited intensity, and duration, some specificity in terms of plans, no associated intent, good self-control, limited dysphoria/symptomatology, some risk  factors present, and identifiable protective factors, including available and accessible social support.  PLAN OF CARE:  PLAN Safety and Monitoring: Voluntary admission to inpatient psychiatric unit for safety, stabilization and treatment Daily contact with patient to assess and evaluate symptoms and progress in treatment Patient's case to be discussed in multi-disciplinary team meeting Observation Level : q15 minute checks Vital signs: q12 hours Precautions: Safety   Long Term Goal(s): Improvement in symptoms so as ready for discharge   Short Term Goals: Ability to identify changes in lifestyle to reduce recurrence of condition will improve, Ability to disclose and discuss suicidal ideas, Ability to demonstrate self-control will improve, Ability to identify and develop effective coping behaviors will improve, Compliance with prescribed medications will improve, and Ability to identify triggers associated with substance abuse/mental health issues will improve   Diagnoses: Schizoaffective d/o, bipolar type Principal Problem:   Schizoaffective disorder, bipolar type ([place]) Active Problems:   Hypertension   OA (osteoarthritis) of knee   Insomnia   GERD (gastroesophageal reflux disease)   GAD (generalized anxiety disorder)   Cocaine use disorder (HCC)                    Medications -Continue Norvasc 5 mg daily for htn -Continue Citalopram 20 mg daily for depression -Continue Eliquis 5 mg BID for blood clots -Continue Amitiza 24 mcg BID for IBS -Continue Lyrica 150 mg BID for nerve pain -Continue Risperdal 0.5 mg BID for psychosis -Continue Zanaflex 4 mg Q8Hs PRN for muscle pain -Continue Melatonin 3 mg nightly for insomnia -Start Protonix 40 mg daily for GERD -Continue albuterol PRN for wheezinf/SOB -Continue Hydroxyzine 25 mg every 6 hours PRN -Start Dulera BID for COPD -Holding off Clonidine for now since Bps have mostly  been WNL or low   Labs Reviewed: Hemoglobin A1C, lipid  panel, TSH, UA, Vit B12 levels ordered. CBC and CMP reviewed and platelets elevated at 624. Will need PCP f/u after discharge, but as per documentation in charts, pt has a clotting d/o. EKG with [id number]   Discharge Planning: Social work and case management to assist with discharge planning and identification of hospital follow-up needs prior to discharge Estimated LOS: 5-7 days Discharge Concerns: Need to establish a safety plan; Medication compliance and effectiveness Discharge Goals: Return home with outpatient referrals for mental health follow-up including medication management/psychotherapy I certify that inpatient services furnished can reasonably be expected to improve the patient's condition.   Nicholes Rough, NP 10/18/2021, 5:06 PM

## 2021-10-18 NOTE — Progress Notes (Signed)
    10/18/21 2130  Psych Admission Type (Psych Patients Only)  Admission Status Voluntary  Psychosocial Assessment  Patient Complaints Anxiety;Depression  Eye Contact Glaring  Facial Expression Flat;Sad  Affect Sad  Speech Logical/coherent  Interaction Assertive  Motor Activity Slow  Appearance/Hygiene Unremarkable  Behavior Characteristics Cooperative;Appropriate to situation  Mood Anxious  Thought Process  Coherency WDL  Content WDL  Delusions None reported or observed  Perception Hallucinations  Hallucination Auditory;Visual  Judgment Poor  Confusion None  Danger to Self  Current suicidal ideation? Denies  Danger to Others  Danger to Others None reported or observed

## 2021-10-18 NOTE — Progress Notes (Signed)
Patient ID: Kathryn [name], female   DOB: 1970/06/20, 51 y.o.   MRN: [id number] Patient stated to SW that she felt like her heart was coming out of her chest. MHT is taking her vitals. Her vitals are within nomal limits. Also, her room was set at 85 degrees.

## 2021-10-18 NOTE — Progress Notes (Addendum)
D: Patient has bright affect; she is pleasant and cooperative. Several times today patient has ambulated without her walker. She denies any physical pain; she denies AVH. Patient's speech is logical and goal oriented.   A: Continue to monitor medication management and MD orders.  Safety checks completed every 15 minutes per protocol.  Offer support and encouragement as needed.  R: Encourage patient to attend group activities in the milieu.   10/18/21 0900  Psych Admission Type (Psych Patients Only)  Admission Status Voluntary  Psychosocial Assessment  Patient Complaints Anxiety  Eye Contact Fair  Facial Expression Flat;Sad  Affect Apprehensive  Speech Logical/coherent  Interaction Assertive  Motor Activity Slow  Appearance/Hygiene Unremarkable  Behavior Characteristics Cooperative  Mood Anxious  Thought Process  Coherency WDL  Content WDL  Delusions None reported or observed  Perception Hallucinations  Hallucination Auditory;Visual  Judgment Poor  Confusion None  Danger to Self  Current suicidal ideation? Denies  Danger to Others  Danger to Others None reported or observed

## 2021-10-19 ENCOUNTER — Other Ambulatory Visit (HOSPITAL_COMMUNITY): Payer: Self-pay

## 2021-10-19 ENCOUNTER — Encounter (HOSPITAL_COMMUNITY): Payer: Self-pay

## 2021-10-19 LAB — HEMOGLOBIN A1C
Hgb A1c MFr Bld: 5.5 % (ref 4.8–5.6)
Mean Plasma Glucose: 111.15 mg/dL

## 2021-10-19 LAB — VITAMIN D 25 HYDROXY (VIT D DEFICIENCY, FRACTURES): Vit D, 25-Hydroxy: 9.21 ng/mL — ABNORMAL LOW (ref 30–100)

## 2021-10-19 LAB — VITAMIN B12: Vitamin B-12: 154 pg/mL — ABNORMAL LOW (ref 180–914)

## 2021-10-19 LAB — GLUCOSE, CAPILLARY: Glucose-Capillary: 102 mg/dL — ABNORMAL HIGH (ref 70–99)

## 2021-10-19 LAB — LIPID PANEL
Cholesterol: 148 mg/dL (ref 0–200)
HDL: 56 mg/dL (ref >40)
LDL Cholesterol: 84 mg/dL (ref 0–99)
Total CHOL/HDL Ratio: 2.6 RATIO
Triglycerides: 41 mg/dL (ref <150)
VLDL: 8 mg/dL (ref 0–40)

## 2021-10-19 LAB — TSH: TSH: 0.952 u[IU]/mL (ref 0.350–4.500)

## 2021-10-19 MED ORDER — DOCUSATE SODIUM 100 MG PO CAPS
100.0000 mg | ORAL_CAPSULE | Freq: Every day | ORAL | Status: DC
  Administered 2021-10-20: 100 mg via ORAL
  Filled 2021-10-19 (×5): qty 1

## 2021-10-19 MED ORDER — RISPERIDONE 1 MG PO TABS
1.0000 mg | ORAL_TABLET | Freq: Every day | ORAL | Status: DC
  Administered 2021-10-19 – 2021-10-21 (×3): 1 mg via ORAL
  Filled 2021-10-19 (×6): qty 1

## 2021-10-19 MED ORDER — RISPERIDONE 0.5 MG PO TABS
0.5000 mg | ORAL_TABLET | Freq: Every day | ORAL | Status: DC
  Administered 2021-10-20 – 2021-10-22 (×3): 0.5 mg via ORAL
  Filled 2021-10-19 (×5): qty 1

## 2021-10-19 MED ORDER — BENZTROPINE MESYLATE 0.5 MG PO TABS: 0.5000 mg | ORAL_TABLET | Freq: Two times a day (BID) | ORAL | Status: DC | PRN

## 2021-10-19 MED ORDER — MELATONIN 3 MG PO TABS
3.0000 mg | ORAL_TABLET | Freq: Every day | ORAL | Status: DC
  Administered 2021-10-19 – 2021-10-21 (×3): 3 mg via ORAL
  Filled 2021-10-19 (×6): qty 1

## 2021-10-19 MED ORDER — VITAMIN D (ERGOCALCIFEROL) 1.25 MG (50000 UNIT) PO CAPS
50000.0000 [IU] | ORAL_CAPSULE | ORAL | Status: DC
  Administered 2021-10-19: 50000 [IU] via ORAL
  Filled 2021-10-19 (×2): qty 1
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
## 2021-10-19 NOTE — Progress Notes (Signed)
Pt has requested assistance in acquiring a Residential Treatment facility. CSW has assisted with intakes/interview with:  [facility]- declined due to insurance. [facility]- declined due to physical limitations. [facility]- intake completed-awaiting answer. [facility]-intake completed-awaiting answer.   CSW will continue to follow.

## 2021-10-19 NOTE — Group Note (Signed)
Recreation Therapy Group Note   Group Topic:Problem Solving  Group Date: 10/19/2021 Start Time: 0930 End Time: 1000 Facilitators: Victorino Sparrow, LRT,CTRS Location: [address] Dayroom   Goal Area(s) Addresses:  Patient will effectively work with peer towards shared goal.  Patient will identify skills used to make activity successful.  Patient will identify how skills used during activity can be used to reach post d/c goals.    Group Description:  Straw Bridge. In teams of 3-5, patients were given 15 plastic drinking straws and an equal length of masking tape. Using the materials provided, patients were instructed to build a free standing bridge-like structure to suspend an everyday item (ex: puzzle box) off of the floor or table surface. All materials were required to be used by the team in their design. LRT facilitated post-activity discussion reviewing team process. Patients were encouraged to reflect how the skills used in this activity can be generalized to daily life post discharge.   Affect/Mood: N/A   Participation Level: Did not attend    Clinical Observations/Individualized Feedback:     Plan: Continue to engage patient in RT group sessions 2-3x/week.   Victorino Sparrow, Glennis Brink 10/19/2021 12:36 PM

## 2021-10-19 NOTE — Progress Notes (Signed)
Pt denies SI/HI/VH but endorses AH that tell her to hurt herself and verbally agrees to approach staff if these become more apparent or before harming themselves/others. Rates depression 10/10. Rates anxiety 10/10. Rates pain 10/10. Pt is animated and talkative. Pt is demanding with her medications. Pt started to have a coughing fit and stated that it was her flagyl, pt then got hot and proceeded to lift her shirt up above her chest. Pt did not have a bra on. Pt put her shirt down immediately. Pt asked for her melatonin and AVH medication but had to be clarified that she would be getting those this evening. Scheduled medications administered to pt, per MD orders. RN provided support and encouragement to pt. Q15 min safety checks implemented and continued. Pt safe on the unit. RN will continue to monitor and intervene as needed.   10/19/21 0805  Psych Admission Type (Psych Patients Only)  Admission Status Voluntary  Psychosocial Assessment  Patient Complaints Anxiety;Depression  Eye Contact Intense  Facial Expression Animated  Affect Anxious  Speech Logical/coherent;Pressured;Loud  Interaction Demanding;Needy  Motor Activity Slow  Appearance/Hygiene Unremarkable  Behavior Characteristics Cooperative;Appropriate to situation  Mood Anxious;Depressed  Thought Process  Coherency WDL  Content WDL  Delusions None reported or observed  Perception Hallucinations  Hallucination Auditory  Judgment Poor  Confusion None  Danger to Self  Current suicidal ideation? Denies  Danger to Others  Danger to Others None reported or observed

## 2021-10-19 NOTE — Telephone Encounter (Signed)
Can we inform patient it appears [employer] is covered with $0 copay? Let me know if issues. Thanks!

## 2021-10-19 NOTE — Progress Notes (Signed)
Kathryn [name]  10/19/2021 3:39 PM Kathryn [name]  MRN:  [id number]  Subjective:  Kathryn [name] states: "I heard the voices last night, but I have not heard any today. I don't have the suicide thoughts today."  Reason For Admission:  Kathryn [name] is a 51 y.o. female with past history significant for bipolar d/o, GAD, MDD, cocaine dependence, hypertension, asthma, COPD, T2DM, PE on Eliquis, GERD as well as a reported history of schizophrenia who presented to the [facility] due to worsening depressive symptoms and SI, with a plan to use a knife to cut her neck. Pt was transferred to this [facility] voluntarily for treatment and stabilization of her mood.  Today's patient assessment [name]: Pt's chart reviewed, her case discussed with her treatment team. Pt is seen during this encounter with the attending psychiatrist. As per the [facility], she is compliant with her scheduled medications, and as per flow sheets, she slept a total of 5.5 hrs last night. She required a dose of Melatonin 3 mg last night for insomnia. She has also been visible on the unit attending unit group sessions and interacting with other patients on the unit.  Pt presents today with a euthymic mood, and affect is congruent. Attention to personal hygiene and grooming is fair, eye contact is good, speech is clear & coherent. Thought contents are organized and logical, and pt currently denies SI/HI/VH or paranoia. There is no evidence of delusional thoughts.  Pt reports that she had some auditory hallucinations of nonspecific voices, thought insertion and thought withdrawals yesterday, but denies hearing any of these today. She is ambulatory around the unit with a walker. She is working with her assigned Education officer, museum to locate an inpatient rehab for substance abuse, and verbalizes feeling hopeful.  Pt denies any current medication related side effects, and is agreeable to an increase in the nighttime dose of her Risperdal to 1 mg nightly  for Psychosis. She is also asking for Colace for constipation, and wants Melatonin scheduled instead of PRN. Pt has been educated that we are continuing to hold Clonidine at this time since her blood pressures have mostly been WNL. She has also been educated on the rationale for restarting her Flagyl (due to a recent infection with trichomonas). Will continue other medications as listed below.  Principal Problem: Schizoaffective disorder, bipolar type ([place]) Diagnosis: Principal Problem:   Schizoaffective disorder, bipolar type ([place]) Active Problems:   Hypertension   OA (osteoarthritis) of knee   Insomnia   GERD (gastroesophageal reflux disease)   History of DVT of lower extremity   History of pulmonary embolism   GAD (generalized anxiety disorder)   Cocaine use disorder ([place])  Total Time spent with patient: 30 minutes  Past Psychiatric History: As above  Past Medical History:  Past Medical History:  Diagnosis Date   Anxiety    Arthritis    Asthma    Bipolar disorder ([place])    Chronic back pain    COPD (chronic obstructive pulmonary disease) (HCC)    Chronic bronchitis   Depression    Dyspnea    GERD (gastroesophageal reflux disease)    Hypertension    Migraine    Neuropathy    Osteoarthritis of left knee, 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   Single subsegmental pulmonary embolism without acute cor pulmonale ([place]) 06/20/2021   Sleep apnea 04/2020   GETTING A cpap   Suicidal ideation 12/24/2018    Past Surgical History:  Procedure Laterality Date   BACK SURGERY  DILATATION AND CURETTAGE/HYSTEROSCOPY WITH MINERVA N/A 06/09/2020   Procedure: DILATATION AND CURETTAGE/HYSTEROSCOPY WITH MINERVA;  Surgeon: Florian Buff, MD;  Location: [facility];  Service: Gynecology;  Laterality: N/A;   ESOPHAGOGASTRODUODENOSCOPY (EGD) WITH PROPOFOL N/A 12/25/2018   Procedure: ESOPHAGOGASTRODUODENOSCOPY (EGD) WITH PROPOFOL;  Surgeon: Rogene Houston, MD;  Location: [facility] SUITE;  Service: Endoscopy;   Laterality: N/A;   PATELLA-FEMORAL ARTHROPLASTY Left 10/08/2018   Procedure: PATELLA-FEMORAL ARTHROPLASTY;  Surgeon: Marchia Bond, MD;  Location: [facility];  Service: Orthopedics;  Laterality: Left;   TUBAL LIGATION     Family History:  Family History  Problem Relation Age of Onset   Gout Paternal Grandfather    Cirrhosis Paternal Grandfather    Hypertension Paternal Grandmother    Aneurysm Paternal Grandmother    Cirrhosis Maternal Grandmother    Cirrhosis Maternal Grandfather    Cancer Father    Cirrhosis Father    Cirrhosis Mother    Breast cancer Sister    Hypertension Sister    Bronchitis Daughter    Bronchitis Daughter    Asthma Son    Bronchitis Son    Migraines Neg Hx    Family Psychiatric  History: as above Social History:  Social History   Substance and Sexual Activity  Alcohol Use Not Currently     Social History   Substance and Sexual Activity  Drug Use Not Currently   Types: Cocaine   Comment: crack  last used June 2023    Social History   Socioeconomic History   Marital status: Single    Spouse name: Not on file   Number of children: Not on file   Years of education: Not on file   Highest education level: Not on file  Occupational History   Not on file  Tobacco Use   Smoking status: Former    Packs/day: 3.00    Years: 5.00    Total pack years: 15.00    Types: Cigarettes    Passive exposure: Current   Smokeless tobacco: Never   Tobacco comments:    Occasional smoker  Vaping Use   Vaping Use: Never used  Substance and Sexual Activity   Alcohol use: Not Currently   Drug use: Not Currently    Types: Cocaine    Comment: crack  last used June 2023   Sexual activity: Yes    Birth control/protection: Surgical    Comment: tubal, ablation  Other Topics Concern   Not on file  Social History Narrative   R handed    Lives with boyfriend   1 Cup of caffeine daily    Social Determinants of Health   Financial Resource Strain: Not on file   Food Insecurity: No Food Insecurity (03/07/2021)   Hunger Vital Sign    Worried About Running Out of Food in the Last Year: Never true    Ran Out of Food in the Last Year: Never true  Transportation Needs: No Transportation Needs (03/07/2021)   PRAPARE - Hydrologist (Medical): No    Lack of Transportation (Non-Medical): No  Physical Activity: Not on file  Stress: Not on file  Social Connections: Not on file   Additional Social History:   Sleep: Fair  Appetite:  Good  Current Medications: Current Facility-Administered Medications  Medication Dose Route Frequency Provider Last Rate Last Admin   albuterol (PROVENTIL) (2.5 MG/3ML) 0.083% nebulizer solution 2.5 mg  2.5 mg Nebulization Q6H PRN Mallie Darting, NP  albuterol (VENTOLIN HFA) 108 (90 Base) MCG/ACT inhaler 2 puff  2 puff Inhalation Q6H PRN Nelda Marseille, Amy E, MD       amLODipine (NORVASC) tablet 5 mg  5 mg Oral Daily Merlyn Lot E, NP   5 mg at 10/19/21 0805   anagrelide (AGRYLIN) capsule 1 mg  1 mg Oral BID Merlyn Lot E, NP   1 mg at 10/19/21 0805   apixaban (ELIQUIS) tablet 5 mg  5 mg Oral BID Merlyn Lot E, NP   5 mg at 10/19/21 0805   benztropine (COGENTIN) tablet 0.5 mg  0.5 mg Oral BID PRN Harlow Asa, MD       citalopram (CELEXA) tablet 20 mg  20 mg Oral Daily Merlyn Lot E, NP   20 mg at 10/19/21 0805   docusate sodium (COLACE) capsule 100 mg  100 mg Oral Daily Nicholes Rough, NP       hydrOXYzine (ATARAX) tablet 25 mg  25 mg Oral TID PRN Harlow Asa, MD   25 mg at 10/19/21 8144   lubiprostone (AMITIZA) capsule 24 mcg  24 mcg Oral BID WC Merlyn Lot E, NP   24 mcg at 10/19/21 0804   melatonin tablet 3 mg  3 mg Oral QHS Cassady Turano, NP       metroNIDAZOLE (FLAGYL) tablet 500 mg  500 mg Oral Q12H Nelda Marseille, Amy E, MD   500 mg at 10/19/21 0804   mometasone-formoterol (DULERA) 200-5 MCG/ACT inhaler 2 puff  2 puff Inhalation BID Harlow Asa, MD   2 puff at  10/19/21 0809   pantoprazole (PROTONIX) EC tablet 40 mg  40 mg Oral Daily Harlow Asa, MD   40 mg at 10/19/21 0805   pregabalin (LYRICA) capsule 150 mg  150 mg Oral BID Merlyn Lot E, NP   150 mg at 10/19/21 0805   [START ON 10/20/2021] risperiDONE (RISPERDAL) tablet 0.5 mg  0.5 mg Oral Daily Jourden Gilson, Tamela Oddi, NP       risperiDONE (RISPERDAL) tablet 1 mg  1 mg Oral QHS Nathanel Tallman, NP       tiZANidine (ZANAFLEX) tablet 4 mg  4 mg Oral Q8H PRN Mallie Darting, NP   4 mg at 10/19/21 8185    Lab Results:  Results for orders placed or performed during the hospital encounter of 10/17/21 (from the past 48 hour(s))  Urinalysis, Complete w Microscopic Urine, Clean Catch     Status: Abnormal   Collection Time: 10/18/21 10:35 AM  Result Value Ref Range   Color, Urine YELLOW YELLOW   APPearance CLEAR CLEAR   Specific Gravity, Urine 1.016 1.005 - 1.030   pH 7.0 5.0 - 8.0   Glucose, UA NEGATIVE NEGATIVE mg/dL   Hgb urine dipstick NEGATIVE NEGATIVE   Bilirubin Urine NEGATIVE NEGATIVE   Ketones, ur NEGATIVE NEGATIVE mg/dL   Protein, ur NEGATIVE NEGATIVE mg/dL   Nitrite NEGATIVE NEGATIVE   Leukocytes,Ua TRACE (A) NEGATIVE   RBC / HPF 0-5 0 - 5 RBC/hpf   WBC, UA 21-50 0 - 5 WBC/hpf   Bacteria, UA NONE SEEN NONE SEEN   Squamous Epithelial / LPF 0-5 0 - 5   Mucus PRESENT     Comment: Performed at [facility], [place] [address] [place], [place] [address]  Lipid panel     Status: None   Collection Time: 10/19/21  6:43 AM  Result Value Ref Range   Cholesterol 148 0 - 200 mg/dL   Triglycerides 41 <150 mg/dL  HDL 56 >40 mg/dL   Total CHOL/HDL Ratio 2.6 RATIO   VLDL 8 0 - 40 mg/dL   LDL Cholesterol 84 0 - 99 mg/dL    Comment:        Total Cholesterol/HDL:CHD Risk Coronary Heart Disease Risk Table                     Men   Women  1/2 Average Risk   3.4   3.3  Average Risk       5.0   4.4  2 X Average Risk   9.6   7.1  3 X Average Risk  23.4   11.0        Use the  calculated Patient Ratio above and the CHD Risk Table to determine the patient's CHD Risk.        ATP III CLASSIFICATION (LDL):  <100     mg/dL   Optimal  100-129  mg/dL   Near or Above                    Optimal  130-159  mg/dL   Borderline  160-189  mg/dL   High  >190     mg/dL   Very High Performed at [place] [address], [place], [place] [address]   TSH     Status: None   Collection Time: 10/19/21  6:43 AM  Result Value Ref Range   TSH 0.952 0.350 - 4.500 uIU/mL    Comment: Performed by a 3rd Generation assay with a functional sensitivity of <=0.01 uIU/mL. Performed at [facility], [place] [address], [place], [place] [address]   Hemoglobin A1c     Status: None   Collection Time: 10/19/21  6:43 AM  Result Value Ref Range   Hgb A1c MFr Bld 5.5 4.8 - 5.6 %    Comment: ([name]) Pre diabetes:          5.7%-6.4%  Diabetes:              >6.4%  Glycemic control for   <7.0% adults with diabetes    Mean Plasma Glucose 111.15 mg/dL    Comment: Performed at [place] [address], [place], [place] [address]  VITAMIN D 25 Hydroxy (Vit-D Deficiency, Fractures)     Status: Abnormal   Collection Time: 10/19/21  6:43 AM  Result Value Ref Range   Vit D, 25-Hydroxy 9.21 (L) 30 - 100 ng/mL    Comment: ([name]) Vitamin D deficiency has been defined by the [place] practice guideline as a level of serum 25-OH  vitamin D less than 20 ng/mL (1,2). The Endocrine Society went on to  further define vitamin D insufficiency as a level between 21 and 29  ng/mL (2).  1. IOM (Institute of Medicine). 2010. Dietary reference intakes for  calcium and D. [place]: The [employer]. 2. Holick MF, Binkley Grayridge, Bischoff-Ferrari HA, et al. Evaluation,  treatment, and prevention of vitamin D deficiency: an Endocrine  Society clinical practice guideline, JCEM. 2011 Jul; 96(7): 1911-30.  Performed  at [facility], [place] [address], [place], [place] [address]   Vitamin B12     Status: Abnormal   Collection Time: 10/19/21  6:43 AM  Result Value Ref Range   Vitamin B-12 154 (L) 180 - 914 pg/mL    Comment: ([name]) This assay is not validated for testing neonatal or  myeloproliferative syndrome specimens for Vitamin B12 levels. Performed at [facility], [place] [address], [place], [place] [address]   Glucose, capillary     Status: Abnormal   Collection Time: 10/19/21 12:08 PM  Result Value Ref Range   Glucose-Capillary 102 (H) 70 - 99 mg/dL    Comment: Glucose reference range applies only to samples taken after fasting for at least 8 hours.   Comment 1 Notify RN     Blood Alcohol level:  Lab Results  Component Value Date   ETH <10 10/16/2021   ETH <10 17/51/0258    Metabolic Disorder Labs: Lab Results  Component Value Date   HGBA1C 5.5 10/19/2021   MPG 111.15 10/19/2021   MPG 108 04/20/2017   No results found for: "PROLACTIN" Lab Results  Component Value Date   CHOL 148 10/19/2021   TRIG 41 10/19/2021   HDL 56 10/19/2021   CHOLHDL 2.6 10/19/2021   VLDL 8 10/19/2021   LDLCALC 84 10/19/2021   LDLCALC 72 04/20/2017    Physical Findings: AIMS: Facial and Oral Movements Muscles of Facial Expression: None, normal Lips and Perioral Area: None, normal Jaw: None, normal Tongue: None, normal,Extremity Movements Upper (arms, wrists, hands, fingers): None, normal Lower (legs, knees, ankles, toes): None, normal, Trunk Movements Neck, shoulders, hips: None, normal, Overall Severity Severity of abnormal movements (highest score from questions above): None, normal Incapacitation due to abnormal movements: None, normal Patient's awareness of abnormal movements (rate only patient's report): No Awareness, Dental Status Current problems with teeth and/or dentures?: No Does patient usually wear dentures?: No  CIWA:    COWS:      Musculoskeletal: Strength & Muscle Tone: decreased Gait & Station: normal Patient leans: N/A  Psychiatric Specialty Exam:  Presentation  General Appearance: Appropriate for Environment; Fairly Groomed  Eye Contact:Fair  Speech:Clear and Coherent  Speech Volume:Normal  Handedness:Right   Mood and Affect  Mood:Euthymic  Affect:Congruent   Thought Process  Thought Processes:Coherent  Descriptions of Associations:Intact  Orientation:Full (Time, Place and Person)  Thought Content:Logical  History of Schizophrenia/Schizoaffective disorder:Yes  Duration of Psychotic Symptoms:N/A  Hallucinations:Hallucinations: None Description of Auditory Hallucinations: none today Description of Visual Hallucinations: none today  Ideas of Reference:None  Suicidal Thoughts:Suicidal Thoughts: No  Homicidal Thoughts:Homicidal Thoughts: No   Sensorium  Memory:Immediate Good  Judgment:Fair  Insight:Fair   Executive Functions  Concentration:Fair  Attention Span:Fair  Cotati   Psychomotor Activity  Psychomotor Activity:Psychomotor Activity: Normal   Assets  Assets:Communication Skills   Sleep  Sleep:Sleep: Fair    Physical Exam: Physical Exam Constitutional:      Appearance: She is obese.  HENT:     Head: Normocephalic.     Nose: Nose normal.  Eyes:     Pupils: Pupils are equal, round, and reactive to light.  Pulmonary:     Effort: Pulmonary effort is normal.  Musculoskeletal:     Cervical back: Normal range of motion.  Neurological:     Mental Status: She is alert and oriented to person, place, and time.     Sensory: No sensory deficit.  Psychiatric:        Behavior: Behavior normal.    Review of Systems  Constitutional: Negative.  Negative for fever.  HENT: Negative.    Eyes: Negative.   Respiratory: Negative.  Negative for cough.   Cardiovascular: Negative.  Negative for chest pain.   Gastrointestinal: Negative.  Negative for heartburn.  Genitourinary: Negative.   Musculoskeletal: Negative.  Skin: Negative.   Psychiatric/Behavioral:  Positive for depression, hallucinations and substance abuse. Negative for memory loss and suicidal ideas. The patient is nervous/anxious and has insomnia.    Blood pressure 127/78, pulse 97, temperature 98.5 F (36.9 C), temperature source Oral, resp. rate 18, height 4' 11.5" (1.511 m), weight 83.2 kg, last menstrual period 06/07/2020, SpO2 99 %. Body mass index is 36.42 kg/m.  Treatment Plan Summary: Daily contact with patient to assess and evaluate symptoms and progress in treatment and Medication management   Observation Level/Precautions:  15 minute checks  Laboratory:  Labs reviewed   Psychotherapy:  Unit Group sessions  Medications:  See [facility]  Consultations:  To be determined   Discharge Concerns:  Safety, medication compliance, mood stability  Estimated LOS: 5-7 days  Other:  N/A    PLAN Safety and Monitoring: Voluntary admission to inpatient psychiatric unit for safety, stabilization and treatment Daily contact with patient to assess and evaluate symptoms and progress in treatment Patient's case to be discussed in multi-disciplinary team meeting Observation Level : q15 minute checks Vital signs: q12 hours Precautions: Safety   Long Term Goal(s): Improvement in symptoms so as ready for discharge   Short Term Goals: Ability to identify changes in lifestyle to reduce recurrence of condition will improve, Ability to disclose and discuss suicidal ideas, Ability to demonstrate self-control will improve, Ability to identify and develop effective coping behaviors will improve, Compliance with prescribed medications will improve, and Ability to identify triggers associated with substance abuse/mental health issues will improve   Diagnoses: Schizoaffective d/o, bipolar type Principal Problem:   Schizoaffective disorder, bipolar  type ([place]) Active Problems:   Hypertension   OA (osteoarthritis) of knee   Insomnia   GERD (gastroesophageal reflux disease)   History of DVT of lower extremity   History of pulmonary embolism   GAD (generalized anxiety disorder)   Cocaine use disorder (HCC)                    Medications -Continue Norvasc 5 mg daily for htn -Continue Citalopram 20 mg daily for depression -Continue Eliquis 5 mg BID for blood clots -Continue Amitiza 24 mcg BID for IBS -Continue Lyrica 150 mg BID for nerve pain -Continue Risperdal 0.5 mg  in the mornings and change nightly dose to 1 mg nightly for psychosis -Continue Zanaflex 4 mg Q8Hs PRN for muscle pain -Continue Melatonin 3 mg nightly for insomnia -Continue Protonix 40 mg daily for GERD -Continue albuterol PRN for wheezinf/SOB -Continue Hydroxyzine 25 mg every 6 hours PRN -Continue Dulera BID for COPD -Holding off Clonidine for now since Bps have mostly been WNL or low -Start Colace daily for constipation   Labs Reviewed: Hemoglobin A1C, lipid panel, TSH, UA, Vit B12 levels ordered. CBC and CMP reviewed and platelets elevated at 624. Will need PCP f/u after discharge, but as per documentation in charts, pt has a clotting d/o. EKG with [id number]   Discharge Planning: Social work and case management to assist with discharge planning and identification of hospital follow-up needs prior to discharge Estimated LOS: 5-7 days Discharge Concerns: Need to establish a safety plan; Medication compliance and effectiveness Discharge Goals: Return home with outpatient referrals for mental health follow-up including medication management/psychotherapy  Nicholes Rough, NP 10/19/2021, 3:39 PM

## 2021-10-19 NOTE — Progress Notes (Signed)
D) Pt received calm, visible, participating in milieu, and in no acute distress. Pt A & O x4. Pt denies SI, HI, A/ V H, depression, anxiety and pain at this time. A) Pt encouraged to drink fluids. Pt encouraged to come to staff with needs. Pt encouraged to attend and participate in groups. Pt encouraged to set reachable goals.  R) Pt remained safe on unit, in no acute distress, will continue to assess.      10/19/21 1930  Psych Admission Type (Psych Patients Only)  Admission Status Voluntary  Psychosocial Assessment  Patient Complaints Depression;Insomnia  Eye Contact Fair  Facial Expression Animated  Affect Anxious  Speech Logical/coherent  Interaction Attention-seeking  Motor Activity Slow  Appearance/Hygiene Unremarkable  Behavior Characteristics Calm;Cooperative  Mood Anxious  Thought Process  Coherency WDL  Content WDL  Delusions None reported or observed  Perception Hallucinations  Hallucination Auditory  Judgment Poor  Confusion None  Danger to Self  Current suicidal ideation? Denies  Danger to Others  Danger to Others None reported or observed

## 2021-10-19 NOTE — BH IP Treatment Plan (Signed)
Interdisciplinary Treatment and Diagnostic Plan Update  10/19/2021 Time of Session: 9:25am  Kathryn [name] MRN: [id number]  Principal Diagnosis: Schizoaffective disorder, bipolar type ([place])  Secondary Diagnoses: Principal Problem:   Schizoaffective disorder, bipolar type ([place]) Active Problems:   Hypertension   OA (osteoarthritis) of knee   Insomnia   GERD (gastroesophageal reflux disease)   History of DVT of lower extremity   History of pulmonary embolism   GAD (generalized anxiety disorder)   Cocaine use disorder (HCC)   Current Medications:  Current Facility-Administered Medications  Medication Dose Route Frequency Provider Last Rate Last Admin   albuterol (PROVENTIL) (2.5 MG/3ML) 0.083% nebulizer solution 2.5 mg  2.5 mg Nebulization Q6H PRN Mallie Darting, NP       albuterol (VENTOLIN HFA) 108 (90 Base) MCG/ACT inhaler 2 puff  2 puff Inhalation Q6H PRN Nelda Marseille, Amy E, MD       amLODipine (NORVASC) tablet 5 mg  5 mg Oral Daily Merlyn Lot E, NP   5 mg at 10/19/21 0805   anagrelide (AGRYLIN) capsule 1 mg  1 mg Oral BID Merlyn Lot E, NP   1 mg at 10/19/21 0805   apixaban (ELIQUIS) tablet 5 mg  5 mg Oral BID Merlyn Lot E, NP   5 mg at 10/19/21 0805   benztropine (COGENTIN) tablet 0.5 mg  0.5 mg Oral BID PRN Harlow Asa, MD       citalopram (CELEXA) tablet 20 mg  20 mg Oral Daily Merlyn Lot E, NP   20 mg at 10/19/21 0805   hydrOXYzine (ATARAX) tablet 25 mg  25 mg Oral TID PRN Harlow Asa, MD   25 mg at 10/19/21 0807   lubiprostone (AMITIZA) capsule 24 mcg  24 mcg Oral BID WC Merlyn Lot E, NP   24 mcg at 10/19/21 0804   melatonin tablet 3 mg  3 mg Oral QHS PRN Harlow Asa, MD   3 mg at 10/18/21 2130   metroNIDAZOLE (FLAGYL) tablet 500 mg  500 mg Oral Q12H Nelda Marseille, Amy E, MD   500 mg at 10/19/21 0804   mometasone-formoterol (DULERA) 200-5 MCG/ACT inhaler 2 puff  2 puff Inhalation BID Harlow Asa, MD   2 puff at 10/19/21 0809   pantoprazole  (PROTONIX) EC tablet 40 mg  40 mg Oral Daily Harlow Asa, MD   40 mg at 10/19/21 0805   pregabalin (LYRICA) capsule 150 mg  150 mg Oral BID Merlyn Lot E, NP   150 mg at 10/19/21 0805   risperiDONE (RISPERDAL) tablet 0.5 mg  0.5 mg Oral BID Merlyn Lot E, NP   0.5 mg at 10/19/21 0804   tiZANidine (ZANAFLEX) tablet 4 mg  4 mg Oral Q8H PRN Mallie Darting, NP   4 mg at 10/19/21 0535   PTA Medications: Medications Prior to Admission  Medication Sig Dispense Refill Last Dose   albuterol (PROVENTIL) (2.5 MG/3ML) 0.083% nebulizer solution Take 3 mLs (2.5 mg total) by nebulization every 6 (six) hours as needed for wheezing or shortness of breath. 75 mL 11    albuterol (VENTOLIN HFA) 108 (90 Base) MCG/ACT inhaler Inhale 2 puffs into the lungs every 6 (six) hours as needed for wheezing or shortness of breath. 18 g 5    amLODipine (NORVASC) 5 MG tablet TAKE 1 TABLET BY MOUTH DAILY FOR BLOOD PRESSURE 30 tablet 5    anagrelide (AGRYLIN) 1 MG capsule Take 1 capsule (1 mg total) by mouth 2 (two) times daily. [place]  capsule 6    citalopram (CELEXA) 20 MG tablet Take 20 mg by mouth daily.      cloNIDine (CATAPRES) 0.1 MG tablet TAKE 1 TABLET BY MOUTH DAILY 30 tablet 5    Cyanocobalamin (VITAMIN B 12 PO) Take 1 tablet by mouth daily.      DULoxetine (CYMBALTA) 30 MG capsule Take 1 capsule (30 mg total) by mouth 2 (two) times daily. 60 capsule 5    ELIQUIS 5 MG TABS tablet Take 5 mg by mouth 2 (two) times daily.      fluticasone-salmeterol (WIXELA INHUB) 250-50 MCG/ACT AEPB Inhale 1 puff into the lungs in the morning and at bedtime. 180 each 3    furosemide (LASIX) 20 MG tablet TAKE 1 TABLET BY MOUTH DAILY 30 tablet 10    hydrocortisone (ANUSOL-HC) 2.5 % rectal cream Place 1 application rectally 2 (two) times daily. 30 g 0    hydrOXYzine (VISTARIL) 50 MG capsule Take 1 capsule (50 mg total) by mouth daily as needed for anxiety (Insomnia). 30 capsule 5    lubiprostone (AMITIZA) 24 MCG capsule Take 1 capsule  (24 mcg total) by mouth 2 (two) times daily with a meal. 60 capsule 1    metroNIDAZOLE (FLAGYL) 500 MG tablet Take 1 tablet (500 mg total) by mouth 2 (two) times daily. One po bid x 7 days (Patient not taking: Reported on 10/16/2021) 14 tablet 0    Misc. Devices MISC Rolling walker - ICD10: M48.061, Z98.1, M17.9 1 each 0    omeprazole (PRILOSEC) 20 MG capsule Take 1 capsule (20 mg total) by mouth daily. 90 capsule 3    ondansetron (ZOFRAN-ODT) 4 MG disintegrating tablet Take 1 tablet (4 mg total) by mouth every 8 (eight) hours as needed for up to 10 doses for nausea or vomiting. 10 tablet 0    potassium chloride SA (KLOR-CON M) 20 MEQ tablet TAKE (1) TABLET BY MOUTH ONCE A DAY. (Patient taking differently: Take 20 mEq by mouth daily.) 30 tablet 0    pregabalin (LYRICA) 150 MG capsule Take 1 capsule (150 mg total) by mouth 2 (two) times daily. 60 capsule 5    Respiratory Therapy Supplies (NEBULIZER/TUBING/MOUTHPIECE) KIT Face mask and tubing for nebulizer machine 1 kit 0    risperiDONE (RISPERDAL) 0.5 MG tablet Take 0.5 mg by mouth 2 (two) times daily.      sucralfate (CARAFATE) 1 g tablet TAKE 1 TABLET BY MOUTH FOUR TIMES DAILY WITH MEALS AND AT BEDTIME (Patient not taking: Reported on 10/06/2021) 120 tablet 10    tiZANidine (ZANAFLEX) 4 MG tablet Take 1 tablet (4 mg total) by mouth every 8 (eight) hours as needed for muscle spasms. 30 tablet 2     Patient Stressors: Financial difficulties   Health problems   Other: Homeless    Patient Strengths: Marketing executive fund of knowledge  Motivation for treatment/growth  Religious Affiliation   Treatment Modalities: Medication Management, Group therapy, Case management,  1 to 1 session with clinician, Psychoeducation, Recreational therapy.   Physician Treatment Plan for Primary Diagnosis: Schizoaffective disorder, bipolar type ([place]) Long Term Goal(s): Improvement in symptoms so as ready for discharge   Short Term Goals: Ability to  identify changes in lifestyle to reduce recurrence of condition will improve Ability to disclose and discuss suicidal ideas Ability to demonstrate self-control will improve Ability to identify and develop effective coping behaviors will improve Compliance with prescribed medications will improve Ability to identify triggers associated with substance abuse/mental health issues will improve  Medication Management: Evaluate patient's response, side effects, and tolerance of medication regimen.  Therapeutic Interventions: 1 to 1 sessions, Unit Group sessions and Medication administration.  Evaluation of Outcomes: Not Met  Physician Treatment Plan for Secondary Diagnosis: Principal Problem:   Schizoaffective disorder, bipolar type ([place]) Active Problems:   Hypertension   OA (osteoarthritis) of knee   Insomnia   GERD (gastroesophageal reflux disease)   History of DVT of lower extremity   History of pulmonary embolism   GAD (generalized anxiety disorder)   Cocaine use disorder ([place])  Long Term Goal(s): Improvement in symptoms so as ready for discharge   Short Term Goals: Ability to identify changes in lifestyle to reduce recurrence of condition will improve Ability to disclose and discuss suicidal ideas Ability to demonstrate self-control will improve Ability to identify and develop effective coping behaviors will improve Compliance with prescribed medications will improve Ability to identify triggers associated with substance abuse/mental health issues will improve     Medication Management: Evaluate patient's response, side effects, and tolerance of medication regimen.  Therapeutic Interventions: 1 to 1 sessions, Unit Group sessions and Medication administration.  Evaluation of Outcomes: Not Met   RN Treatment Plan for Primary Diagnosis: Schizoaffective disorder, bipolar type ([place]) Long Term Goal(s): Knowledge of disease and therapeutic regimen to maintain health will  improve  Short Term Goals: Ability to remain free from injury will improve, Ability to participate in decision making will improve, Ability to verbalize feelings will improve, Ability to disclose and discuss suicidal ideas, and Ability to identify and develop effective coping behaviors will improve  Medication Management: RN will administer medications as ordered by provider, will assess and evaluate patient's response and provide education to patient for prescribed medication. RN will report any adverse and/or side effects to prescribing provider.  Therapeutic Interventions: 1 on 1 counseling sessions, Psychoeducation, Medication administration, Evaluate responses to treatment, Monitor vital signs and CBGs as ordered, Perform/monitor CIWA, COWS, AIMS and Fall Risk screenings as ordered, Perform wound care treatments as ordered.  Evaluation of Outcomes: Not Met   LCSW Treatment Plan for Primary Diagnosis: Schizoaffective disorder, bipolar type ([place]) Long Term Goal(s): Safe transition to appropriate next level of care at discharge, Engage patient in therapeutic group addressing interpersonal concerns.  Short Term Goals: Engage patient in aftercare planning with referrals and resources, Increase social support, Increase emotional regulation, Facilitate acceptance of mental health diagnosis and concerns, Identify triggers associated with mental health/substance abuse issues, and Increase skills for wellness and recovery  Therapeutic Interventions: Assess for all discharge needs, 1 to 1 time with Social worker, Explore available resources and support systems, Assess for adequacy in community support network, Educate family and significant other(s) on suicide prevention, Complete Psychosocial Assessment, Interpersonal group therapy.  Evaluation of Outcomes: Not Met   Progress in Treatment: Attending groups: Yes. Participating in groups: Yes. Taking medication as prescribed: Yes. Toleration  medication: Yes. Family/Significant other contact made: No, will contact:  Pt declined consents  Patient understands diagnosis: No. Discussing patient identified problems/goals with staff: Yes. Medical problems stabilized or resolved: Yes. Denies suicidal/homicidal ideation: Yes. Issues/concerns per patient self-inventory: No.   New problem(s) identified: No, Describe:  None   New Short Term/Long Term Goal(s): medication stabilization, elimination of SI thoughts, development of comprehensive mental wellness plan.   Patient Goals: "To stay clean and sober"   Discharge Plan or Barriers: Patient recently admitted. CSW will continue to follow and assess for appropriate referrals and possible discharge planning.   Reason for Continuation of  Hospitalization: Anxiety Depression Medication stabilization Suicidal ideation  Estimated Length of Stay: 3 to 5 days   Last 3 [place] Suicide Severity Risk Score: Flowsheet Row Admission (Current) from 10/17/2021 in [facility] ED from 10/16/2021 in [facility] Admission (Discharged) from 10/14/2021 in [facility] High Risk High Risk No Risk       Last PHQ 2/9 Scores:    09/06/2021    2:18 PM 04/26/2021   10:39 AM 03/24/2021   11:01 AM  Depression screen PHQ 2/9  Decreased Interest 0 0 0  Down, Depressed, Hopeless 0 1 1  PHQ - 2 Score 0 1 1  Altered sleeping 0 3   Tired, decreased energy 0 3   Change in appetite 0 0   Feeling bad or failure about yourself  0 0   Trouble concentrating 0 2   Moving slowly or fidgety/restless 0 3   Suicidal thoughts 0 0   PHQ-9 Score 0 12   Difficult doing work/chores Not difficult at all Very difficult     Scribe for Treatment Team: Darleen Crocker, Latanya Presser 10/19/2021 2:45 PM

## 2021-10-19 NOTE — Group Note (Signed)
[facility] LCSW Group Therapy Note   Group Date: 10/19/2021 Start Time: 1300 End Time: 1400   Type of Therapy/Topic:  Group Therapy:  Emotion Regulation  Participation Level:  Active   Mood:  Description of Group:    The purpose of this group is to assist patients in learning to regulate negative emotions and experience positive emotions. Patients will be guided to discuss ways in which they have been vulnerable to their negative emotions. These vulnerabilities will be juxtaposed with experiences of positive emotions or situations, and patients challenged to use positive emotions to combat negative ones. Special emphasis will be placed on coping with negative emotions in conflict situations, and patients will process healthy conflict resolution skills.  Therapeutic Goals: Patient will identify two positive emotions or experiences to reflect on in order to balance out negative emotions:  Patient will label two or more emotions that they find the most difficult to experience:  Patient will be able to demonstrate positive conflict resolution skills through discussion or role plays:   Summary of Patient Progress:   The Pt attended group and remained there the entire time.  The Pt interacted well with their peers and participated openly in the group discussion.  The Pt accepted all worksheets and followed along.  The Pt demonstrated understanding of the topic being discussed and was able to provide details and build on other peoples ideas and suggestions.     Therapeutic Modalities:   Cognitive Behavioral Therapy Feelings Identification Dialectical Behavioral Therapy   Darleen Crocker, LCSWA

## 2021-10-19 NOTE — Telephone Encounter (Signed)
Called patient. Phone rang twice before the busy tone. Will attempt to call back later.

## 2021-10-20 ENCOUNTER — Encounter: Payer: Self-pay | Admitting: *Deleted

## 2021-10-20 ENCOUNTER — Encounter (HOSPITAL_COMMUNITY): Payer: Self-pay | Admitting: Gastroenterology

## 2021-10-20 DIAGNOSIS — A599 Trichomoniasis, unspecified: Secondary | ICD-10-CM | POA: Diagnosis present

## 2021-10-20 DIAGNOSIS — F1910 Other psychoactive substance abuse, uncomplicated: Secondary | ICD-10-CM

## 2021-10-20 MED ORDER — VITAMIN B-12 1000 MCG PO TABS
1000.0000 ug | ORAL_TABLET | Freq: Every day | ORAL | Status: DC
  Administered 2021-10-20 – 2021-10-22 (×3): 1000 ug via ORAL
  Filled 2021-10-20 (×6): qty 1

## 2021-10-20 MED ORDER — FUROSEMIDE 20 MG PO TABS
20.0000 mg | ORAL_TABLET | Freq: Once | ORAL | Status: AC
  Administered 2021-10-20: 20 mg via ORAL
  Filled 2021-10-20 (×2): qty 1

## 2021-10-20 NOTE — Plan of Care (Signed)
I called the patient's PCP , Dr Ihor Dow, in [place] to clarify her home BP regimen and her anticoagulation regimen. Patient was unclear on admission as to when she restarted her anticoagulation and what BP meds she had been taking recently.  Per notes, it appears hematology saw her in June 2023 and wrote for her to be on platelet lowering agent, Anagrelide but does not mention Eliquis. Her May visit with Dr. Posey Pronto references that she completed Eliquis X6 months. Dr. Posey Pronto confirms that she is supposed to be off Eliquis at this time and med can be stopped. He states she was supposed to be on Clonidine, Lasix, and Norvasc for BP management but she had been inconsistent in her medications and visits. He recommends continued Norvasc with PRN Lasix for edema if needed.  Viann Fish, MD, Alda Ponder

## 2021-10-20 NOTE — Progress Notes (Signed)
Psychoeducational Group Note  Date:  10/20/2021 Time:  2015  Group Topic/Focus:  Wrap up group  Participation Level: Did Not Attend  Participation Quality:  Not Applicable  Affect:  Not Applicable  Cognitive:  Not Applicable  Insight:  Not Applicable  Engagement in Group: Not Applicable  Additional Comments:  Did not attend.   Winfield Rast S 10/20/2021, 9:07 PM

## 2021-10-20 NOTE — Progress Notes (Signed)
Chaplain received a referral from Lyvonne's social worker, Doris. Chaplain attempted to meet with Caren Griffins, but she was asleep.  Chaplains will attempt at a later time, but please page if needs arise.  [address], [place] Pager, [phone]

## 2021-10-20 NOTE — Progress Notes (Signed)
Patient lying in bed with feet elevated. Pt c/o pain in bilateral legs and mild swelling in lower extremities (Zanaflex given prn). Pt also c/o having multiple bowel movements today from the Amitiza this morning. Provider aware. Pt says she feels like she is weak, gatorade provided. BP 131/89 P 98. Denies SI/HI, denies A/V hallucinations at present. Denies any thoughts of self harming. Will continue to encourage use of coping skills and complete q74mn checks.

## 2021-10-20 NOTE — Plan of Care (Signed)
  Problem: Education: Goal: Knowledge of [facility] Education information/materials will improve Outcome: Progressing Goal: Emotional status will improve Outcome: Progressing Goal: Mental status will improve Outcome: Progressing Goal: Verbalization of understanding the information provided will improve Outcome: Progressing   Problem: Activity: Goal: Interest or engagement in activities will improve Outcome: Progressing Goal: Sleeping patterns will improve Outcome: Progressing   Problem: Coping: Goal: Ability to verbalize frustrations and anger appropriately will improve Outcome: Progressing Goal: Ability to demonstrate self-control will improve Outcome: Progressing   Problem: Health Behavior/Discharge Planning: Goal: Identification of resources available to assist in meeting health care needs will improve Outcome: Progressing Goal: Compliance with treatment plan for underlying cause of condition will improve Outcome: Progressing   Problem: Physical Regulation: Goal: Ability to maintain clinical measurements within normal limits will improve Outcome: Progressing   Problem: Safety: Goal: Periods of time without injury will increase Outcome: Progressing   Problem: Education: Goal: Ability to make informed decisions regarding treatment will improve Outcome: Progressing   Problem: Coping: Goal: Coping ability will improve Outcome: Progressing   Problem: Health Behavior/Discharge Planning: Goal: Identification of resources available to assist in meeting health care needs will improve Outcome: Progressing   Problem: Medication: Goal: Compliance with prescribed medication regimen will improve Outcome: Progressing   Problem: Self-Concept: Goal: Ability to disclose and discuss suicidal ideas will improve Outcome: Progressing Goal: Will verbalize positive feelings about self Outcome: Progressing   Problem: Education: Goal: Utilization of techniques to improve  thought processes will improve Outcome: Progressing Goal: Knowledge of the prescribed therapeutic regimen will improve Outcome: Progressing   Problem: Activity: Goal: Interest or engagement in leisure activities will improve Outcome: Progressing Goal: Imbalance in normal sleep/wake cycle will improve Outcome: Progressing   Problem: Coping: Goal: Coping ability will improve Outcome: Progressing Goal: Will verbalize feelings Outcome: Progressing   Problem: Health Behavior/Discharge Planning: Goal: Ability to make decisions will improve Outcome: Progressing Goal: Compliance with therapeutic regimen will improve Outcome: Progressing   Problem: Role Relationship: Goal: Will demonstrate positive changes in social behaviors and relationships Outcome: Progressing   Problem: Safety: Goal: Ability to disclose and discuss suicidal ideas will improve Outcome: Progressing Goal: Ability to identify and utilize support systems that promote safety will improve Outcome: Progressing   Problem: Self-Concept: Goal: Will verbalize positive feelings about self Outcome: Progressing Goal: Level of anxiety will decrease Outcome: Progressing   

## 2021-10-20 NOTE — Progress Notes (Addendum)
CSW spoke with [facility], pt's records were faxed and the clinical team is reviewing. Admissions reported they will call when a decision has been made.   CSW checked in with pt to share list of facilities contacted and we are waiting to hear back. CSW inquired of pt if no one accepts, what is the plan. Pt reported that she will return home with her friend, Kathryn [name] and will continue services with [facility] in [place].   CSW will continue to follow.    3:31 pm CSW received call from [place] to denied, due to mobility issues and mental status.

## 2021-10-20 NOTE — BHH Counselor (Signed)
CSW spoke with Sharyn Lull at [facility] and was informed that the Pt has completed her intake for admission and that they will need the Pt's medical records sent by fax [phone] to complete her admission and "make sure she is back to baseline".  CSW will fax this paperwork today and will check back in with Sharyn Lull tomorrow to see if the Pt has been admitted to a treatment facility.

## 2021-10-20 NOTE — BHH Suicide Risk Assessment (Signed)
[facility] INPATIENT:  Family/Significant Other Suicide Prevention Education  Suicide Prevention Education:  Patient Refusal for Family/Significant Other Suicide Prevention Education: The patient Kathryn [name] has refused to provide written consent for family/significant other to be provided Family/Significant Other Suicide Prevention Education during admission and/or prior to discharge.  Physician notified.  Frutoso Chase Jordin Vicencio 10/20/2021, 9:44 AM

## 2021-10-20 NOTE — Progress Notes (Signed)
Pt alert and oriented x4. Pt has been in bed most of the shift. She states yesterday she was constipated and after taking her Amitiza today, she has been having frequent bowel movements. Afebrile. NO s/s of infection, seems to be related to the medication. Pt did not want to take the 1700 dose. Pt otherwise denies any current SI or HI. Denies AVH. States she is still having some anxiety and depressive type thoughts. Pt has bilateral lower extremity edema, and she c/o her legs hurting often. Pt has Lyrica prescribed and tolerating that well, she states it works well. Will continue q15mn checks and provide verbal encouragement as needed.

## 2021-10-20 NOTE — Progress Notes (Signed)
   10/20/21 1950  Psych Admission Type (Psych Patients Only)  Admission Status Voluntary  Psychosocial Assessment  Patient Complaints Other (Comment) (chronic pain lower back and diabetic neuropathy)  Eye Contact Fair  Facial Expression Pained  Affect Appropriate to circumstance  Speech Logical/coherent  Interaction Assertive  Motor Activity Slow (with walker when needed)  Appearance/Hygiene Unremarkable;In hospital gown  Behavior Characteristics Cooperative;Appropriate to situation  Mood Pleasant;Depressed  Thought Process  Coherency WDL  Content WDL  Delusions None reported or observed  Perception WDL  Hallucination None reported or observed  Judgment Impaired  Confusion None  Danger to Self  Current suicidal ideation? Denies  Danger to Others  Danger to Others None reported or observed   Progress note   D: Pt seen in her room. Pt denies SI, HI, AVH. Pt rates pain  200/10 as chronic back pain. Pt rates anxiety  0/10 and depression  0/10. Pt is in pain as she moves around in her bed. Asked if adjustments to bed would help. Offered to adjust bed for more comfort. Bed has leg portion raised. Pt stated she had swelling in her legs earlier. Swelling has gone down on assessment. Dorsalis pedis pulses are +2 bilaterally with good capillary refill. Pt c/o burning and "pins and needles" feeling in feet from neuropathy. Pt states that she takes oxycodone at home for this pain. "When I have bad pain like this I go to the hospital and they give me a shot of dilaudid." Pt is allergic to several pain medications. Pt informed that it is too early for another dose of muscle relaxant. Pt encouraged to drink fluids. Given pitcher of Gatorade. Pt experienced diarrhea today which is a potential side effect of her medication, Amitiza. Pt says her stools are not formed. Pt was also given furosemide for her leg swelling. Pt endorsed some weakness but denied dizziness. No other complaints noted.  A: Pt  provided support and encouragement. Pt given scheduled medication as prescribed. PRNs as appropriate. Q15 min checks for safety.   R: Pt safe on the unit. Will continue to monitor.

## 2021-10-20 NOTE — Progress Notes (Addendum)
[facility] MD Progress Note  10/20/2021 1:39 PM Kathryn [name]  MRN:  [id number]  Subjective:  Kathryn [name] states: "I feel better today. I am ready to go to rehab. I called my friend, he told me to keep pushing myself to get better. I am not having the suicide thoughts any more."  Reason For Admission:  Kathryn [name] is a 51 y.o. female with past history significant for bipolar d/o, GAD, MDD, polysubstance use, hypertension, asthma, COPD, T2DM, PE on Eliquis, GERD as well as a reported history of schizophrenia who presented to the [facility] due to worsening depressive symptoms and SI, with a plan to use a knife to cut her neck. Pt was transferred to this [facility] voluntarily for treatment and stabilization of her mood.  Today's patient assessment note: Pt's chart reviewed, her case discussed with her treatment team. Pt is seen during this encounter with the attending psychiatrist. As per the [facility], she is compliant with her scheduled medications, and as per flow sheets, she slept a total of 8.75 hrs last night. She required a dose of Melatonin 3 mg last night for insomnia. She is continuing to be visible on the unit attending unit group sessions and interacting with staff and peers.  Pt presents today with a euthymic mood, and affect is congruent. Attention to personal hygiene and grooming is fair, eye contact is good, speech is clear & coherent. Thought contents are organized and logical, and pt currently denies SI/HI/AVH or paranoia. There is no evidence of delusional thoughts. Pt reports some bowel movement with the addition of Colace to her medication regimen, but reports that she still feels as though her bowels are not completely evacuated and is requesting Miralax. Will add this medication to pt's medication regimen as PRN. Pt denies any medication related side effects. She is verbalizing motivation to go to rehab and reports that she spoke to a friend as documented above who gave her a lot of  motivations and she is currently has a more positive outlook on life.  Pt reports a good sleep quality last night and reports a good appetite.  Principal Problem: Schizoaffective disorder, bipolar type ([place]) Diagnosis: Principal Problem:   Schizoaffective disorder, bipolar type ([place]) Active Problems:   Hypertension   OA (osteoarthritis) of knee   Insomnia   GERD (gastroesophageal reflux disease)   History of DVT of lower extremity   History of pulmonary embolism   GAD (generalized anxiety disorder)   Polysubstance abuse ([place])   Trichomonas infection  Total Time spent with patient: 30 minutes  Past Psychiatric History: As above  Past Medical History:  Past Medical History:  Diagnosis Date   Anxiety    Arthritis    Asthma    Bipolar disorder ([place])    Chronic back pain    COPD (chronic obstructive pulmonary disease) (HCC)    Chronic bronchitis   Depression    Dyspnea    GERD (gastroesophageal reflux disease)    Hypertension    Migraine    Neuropathy    Osteoarthritis of left knee, 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   Single subsegmental pulmonary embolism without acute cor pulmonale ([place]) 06/20/2021   Sleep apnea 04/2020   GETTING A cpap   Suicidal ideation 12/24/2018    Past Surgical History:  Procedure Laterality Date   BACK SURGERY     DILATATION AND CURETTAGE/HYSTEROSCOPY WITH MINERVA N/A 06/09/2020   Procedure: DILATATION AND CURETTAGE/HYSTEROSCOPY WITH MINERVA;  Surgeon: Florian Buff, MD;  Location: [facility] OR[facility];  Service: Gynecology;  Laterality: N/A;   ESOPHAGOGASTRODUODENOSCOPY (EGD) WITH PROPOFOL N/A 12/25/2018   Procedure: ESOPHAGOGASTRODUODENOSCOPY (EGD) WITH PROPOFOL;  Surgeon: Rogene Houston, MD;  Location: [facility] END[facility] SUITE;  Service: Endoscopy;  Laterality: N/A;   PATELLA-FEMORAL ARTHROPLASTY Left 10/08/2018   Procedure: PATELLA-FEMORAL ARTHROPLASTY;  Surgeon: Marchia Bond, MD;  Location: [facility] ORS;  Service: Orthopedics;  Laterality: Left;   TUBAL LIGATION      Family History:  Family History  Problem Relation Age of Onset   Gout Paternal Grandfather    Cirrhosis Paternal Grandfather    Hypertension Paternal Grandmother    Aneurysm Paternal Grandmother    Cirrhosis Maternal Grandmother    Cirrhosis Maternal Grandfather    Cancer Father    Cirrhosis Father    Cirrhosis Mother    Breast cancer Sister    Hypertension Sister    Bronchitis Daughter    Bronchitis Daughter    Asthma Son    Bronchitis Son    Migraines Neg Hx    Family Psychiatric  History: as above Social History:  Social History   Substance and Sexual Activity  Alcohol Use Not Currently     Social History   Substance and Sexual Activity  Drug Use Not Currently   Types: Cocaine   Comment: crack  last used June 2023    Social History   Socioeconomic History   Marital status: Single    Spouse name: Not on file   Number of children: Not on file   Years of education: Not on file   Highest education level: Not on file  Occupational History   Not on file  Tobacco Use   Smoking status: Former    Packs/day: 3.00    Years: 5.00    Total pack years: 15.00    Types: Cigarettes    Passive exposure: Current   Smokeless tobacco: Never   Tobacco comments:    Occasional smoker  Vaping Use   Vaping Use: Never used  Substance and Sexual Activity   Alcohol use: Not Currently   Drug use: Not Currently    Types: Cocaine    Comment: crack  last used June 2023   Sexual activity: Yes    Birth control/protection: Surgical    Comment: tubal, ablation  Other Topics Concern   Not on file  Social History Narrative   R handed    Lives with boyfriend   1 Cup of caffeine daily    Social Determinants of Health   Financial Resource Strain: Not on file  Food Insecurity: No Food Insecurity (03/07/2021)   Hunger Vital Sign    Worried About Running Out of Food in the Last Year: Never true    Ran Out of Food in the Last Year: Never true  Transportation Needs: No  Transportation Needs (03/07/2021)   PRAPARE - Hydrologist (Medical): No    Lack of Transportation (Non-Medical): No  Physical Activity: Not on file  Stress: Not on file  Social Connections: Not on file   Additional Social History:   Sleep: Fair  Appetite:  Good  Current Medications: Current Facility-Administered Medications  Medication Dose Route Frequency Provider Last Rate Last Admin   albuterol (PROVENTIL) (2.5 MG/3ML) 0.083% nebulizer solution 2.5 mg  2.5 mg Nebulization Q6H PRN Merlyn Lot E, NP       albuterol (VENTOLIN HFA) 108 (90 Base) MCG/ACT inhaler 2 puff  2 puff Inhalation Q6H PRN Harlow Asa, MD  amLODipine (NORVASC) tablet 5 mg  5 mg Oral Daily Merlyn Lot E, NP   5 mg at 10/20/21 0759   anagrelide (AGRYLIN) capsule 1 mg  1 mg Oral BID Merlyn Lot E, NP   1 mg at 10/20/21 0759   apixaban (ELIQUIS) tablet 5 mg  5 mg Oral BID Merlyn Lot E, NP   5 mg at 10/20/21 0809   benztropine (COGENTIN) tablet 0.5 mg  0.5 mg Oral BID PRN Harlow Asa, MD       citalopram (CELEXA) tablet 20 mg  20 mg Oral Daily Merlyn Lot E, NP   20 mg at 10/20/21 0759   docusate sodium (COLACE) capsule 100 mg  100 mg Oral Daily Nicholes Rough, NP   100 mg at 10/20/21 0759   hydrOXYzine (ATARAX) tablet 25 mg  25 mg Oral TID PRN Harlow Asa, MD   25 mg at 10/19/21 5974   lubiprostone (AMITIZA) capsule 24 mcg  24 mcg Oral BID WC Merlyn Lot E, NP   24 mcg at 10/20/21 1638   melatonin tablet 3 mg  3 mg Oral QHS Nicholes Rough, NP   3 mg at 10/19/21 2054   metroNIDAZOLE (FLAGYL) tablet 500 mg  500 mg Oral Q12H Nelda Marseille, Llewellyn Schoenberger E, MD   500 mg at 10/20/21 0759   mometasone-formoterol (DULERA) 200-5 MCG/ACT inhaler 2 puff  2 puff Inhalation BID Harlow Asa, MD   2 puff at 10/20/21 0801   pantoprazole (PROTONIX) EC tablet 40 mg  40 mg Oral Daily Harlow Asa, MD   40 mg at 10/20/21 0759   pregabalin (LYRICA) capsule 150 mg  150 mg Oral BID  Merlyn Lot E, NP   150 mg at 10/20/21 0759   risperiDONE (RISPERDAL) tablet 0.5 mg  0.5 mg Oral Daily Nicholes Rough, NP   0.5 mg at 10/20/21 0759   risperiDONE (RISPERDAL) tablet 1 mg  1 mg Oral QHS Nicholes Rough, NP   1 mg at 10/19/21 2054   tiZANidine (ZANAFLEX) tablet 4 mg  4 mg Oral Q8H PRN Mallie Darting, NP   4 mg at 10/19/21 1620   vitamin B-12 (CYANOCOBALAMIN) tablet 1,000 mcg  1,000 mcg Oral Daily Harlow Asa, MD       Vitamin D (Ergocalciferol) (DRISDOL) capsule 50,000 Units  50,000 Units Oral Q7 days Harlow Asa, MD   50,000 Units at 10/19/21 1620    Lab Results:  Results for orders placed or performed during the hospital encounter of 10/17/21 (from the past 48 hour(s))  Lipid panel     Status: None   Collection Time: 10/19/21  6:43 AM  Result Value Ref Range   Cholesterol 148 0 - 200 mg/dL   Triglycerides 41 <150 mg/dL   HDL 56 >40 mg/dL   Total CHOL/HDL Ratio 2.6 RATIO   VLDL 8 0 - 40 mg/dL   LDL Cholesterol 84 0 - 99 mg/dL    Comment:        Total Cholesterol/HDL:CHD Risk Coronary Heart Disease Risk Table                     Men   Women  1/2 Average Risk   3.4   3.3  Average Risk       5.0   4.4  2 X Average Risk   9.6   7.1  3 X Average Risk  23.4   11.0        Use the calculated  Patient Ratio above and the CHD Risk Table to determine the patient's CHD Risk.        ATP III CLASSIFICATION (LDL):  <100     mg/dL   Optimal  100-129  mg/dL   Near or Above                    Optimal  130-159  mg/dL   Borderline  160-189  mg/dL   High  >190     mg/dL   Very High Performed at [facility] [address] [place], [place] [address]   TSH     Status: None   Collection Time: 10/19/21  6:43 AM  Result Value Ref Range   TSH 0.952 0.350 - 4.500 uIU/mL    Comment: Performed by a 3rd Generation assay with a functional sensitivity of <=0.01 uIU/mL. Performed at [facility], [place] [address] [place], [place]  [address]   Hemoglobin A1c     Status: None   Collection Time: 10/19/21  6:43 AM  Result Value Ref Range   Hgb A1c MFr Bld 5.5 4.8 - 5.6 %    Comment: (NOTE) Pre diabetes:          5.7%-6.4%  Diabetes:              >6.4%  Glycemic control for   <7.0% adults with diabetes    Mean Plasma Glucose 111.15 mg/dL    Comment: Performed at [facility] [address], [place], [place] [address]  VITAMIN D 25 Hydroxy (Vit-D Deficiency, Fractures)     Status: Abnormal   Collection Time: 10/19/21  6:43 AM  Result Value Ref Range   Vit D, 25-Hydroxy 9.21 (L) 30 - 100 ng/mL    Comment: (NOTE) Vitamin D deficiency has been defined by the [place] practice guideline as a level of serum 25-OH  vitamin D less than 20 ng/mL (1,2). The Endocrine Society went on to  further define vitamin D insufficiency as a level between 21 and 29  ng/mL (2).  1. IOM (Institute of Medicine). 2010. Dietary reference intakes for  calcium and D. [place]: The [employer]. 2. Holick MF, Binkley [place], Bischoff-Ferrari HA, et al. Evaluation,  treatment, and prevention of vitamin D deficiency: an Endocrine  Society clinical practice guideline, JCEM. 2011 Jul; 96(7): 1911-30.  Performed at [facility], [place] [address] [place], [place] [address]   Vitamin B12     Status: Abnormal   Collection Time: 10/19/21  6:43 AM  Result Value Ref Range   Vitamin B-12 154 (L) 180 - 914 pg/mL    Comment: (NOTE) This assay is not validated for testing neonatal or myeloproliferative syndrome specimens for Vitamin B12 levels. Performed at [facility], [place] [address], [place], [place] [address]   Glucose, capillary     Status: Abnormal   Collection Time: 10/19/21 12:08 PM  Result Value Ref Range   Glucose-Capillary 102 (H) 70 - 99 mg/dL    Comment: Glucose reference range applies only to samples taken after fasting for at least 8 hours.    Comment 1 Notify RN     Blood Alcohol level:  Lab Results  Component Value Date   ETH <10 10/16/2021   ETH <10 96/78/9381    Metabolic Disorder Labs: Lab Results  Component Value Date   HGBA1C 5.5 10/19/2021   MPG 111.15 10/19/2021   MPG  108 04/20/2017   No results found for: "PROLACTIN" Lab Results  Component Value Date   CHOL 148 10/19/2021   TRIG 41 10/19/2021   HDL 56 10/19/2021   CHOLHDL 2.6 10/19/2021   VLDL 8 10/19/2021   LDLCALC 84 10/19/2021   LDLCALC 72 04/20/2017    Physical Findings: AIMS: Facial and Oral Movements Muscles of Facial Expression: None, normal Lips and Perioral Area: None, normal Jaw: None, normal Tongue: None, normal,Extremity Movements Upper (arms, wrists, hands, fingers): None, normal Lower (legs, knees, ankles, toes): None, normal, Trunk Movements Neck, shoulders, hips: None, normal, Overall Severity Severity of abnormal movements (highest score from questions above): None, normal Incapacitation due to abnormal movements: None, normal Patient's awareness of abnormal movements (rate only patient's report): No Awareness, Dental Status Current problems with teeth and/or dentures?: No Does patient usually wear dentures?: No  CIWA:    COWS:     Musculoskeletal: Strength & Muscle Tone: decreased Gait & Station: normal Patient leans: N/A  Psychiatric Specialty Exam:  Presentation  General Appearance: Appropriate for Environment; Fairly Groomed  Eye Contact:Good  Speech:Clear and Coherent  Speech Volume:Normal  Handedness:Right   Mood and Affect  Mood:Euthymic  Affect:Appropriate; Congruent  Thought Process  Thought Processes:Coherent  Descriptions of Associations:Intact  Orientation:Full (Time, Place and Person)  Thought Content:Logical  History of Schizophrenia/Schizoaffective disorder:Yes  Duration of Psychotic Symptoms:N/A  Hallucinations:Hallucinations: None Description of Auditory Hallucinations: none  today Description of Visual Hallucinations: none today  Ideas of Reference:None  Suicidal Thoughts:Suicidal Thoughts: No  Homicidal Thoughts:Homicidal Thoughts: No  Sensorium  Memory:Immediate Good  Judgment:Fair  Insight:Good   Executive Functions  Concentration:Good  Attention Span:Good  Grandfather of Knowledge:Good  Language:Good  Psychomotor Activity  Psychomotor Activity:Psychomotor Activity: Normal  Assets  Assets:Communication Skills  Sleep  Sleep:Sleep: Good  Physical Exam: Physical Exam Constitutional:      Appearance: She is obese.  HENT:     Head: Normocephalic.     Nose: Nose normal.  Eyes:     Pupils: Pupils are equal, round, and reactive to light.  Pulmonary:     Effort: Pulmonary effort is normal.  Musculoskeletal:     Cervical back: Normal range of motion.     Right lower leg: Edema present.     Left lower leg: Edema present.  Neurological:     Mental Status: She is alert and oriented to person, place, and time.     Sensory: No sensory deficit.  Psychiatric:        Behavior: Behavior normal.    Review of Systems  Constitutional: Negative.  Negative for fever.  HENT: Negative.    Eyes: Negative.   Respiratory: Negative.  Negative for cough.   Cardiovascular: Negative.  Negative for chest pain.  Gastrointestinal: Negative.  Negative for heartburn.  Genitourinary: Negative.   Musculoskeletal: Negative.   Skin: Negative.   Psychiatric/Behavioral:  Positive for depression (Resolving on current meds) and substance abuse. Negative for hallucinations, memory loss and suicidal ideas. The patient is nervous/anxious (resolving) and has insomnia (resolving).    Blood pressure 127/78, pulse 97, temperature 98.5 F (36.9 C), temperature source Oral, resp. rate 18, height 4' 11.5" (1.511 m), weight 83.2 kg, last menstrual period 06/07/2020, SpO2 99 %. Body mass index is 36.42 kg/m.  Treatment Plan Summary: Daily contact with patient  to assess and evaluate symptoms and progress in treatment and Medication management   Observation Level/Precautions:  15 minute checks  Laboratory:  Labs reviewed   Psychotherapy:  Unit Group sessions  Medications:  See [facility]  Consultations:  To be determined   Discharge Concerns:  Safety, medication compliance, mood stability  Estimated LOS: 5-7 days  Other:  N/A    PLAN Safety and Monitoring: Voluntary admission to inpatient psychiatric unit for safety, stabilization and treatment Daily contact with patient to assess and evaluate symptoms and progress in treatment Patient's case to be discussed in multi-disciplinary team meeting Observation Level : q15 minute checks Vital signs: q12 hours Precautions: Safety   Long Term Goal(s): Improvement in symptoms so as ready for discharge   Short Term Goals: Ability to identify changes in lifestyle to reduce recurrence of condition will improve, Ability to disclose and discuss suicidal ideas, Ability to demonstrate self-control will improve, Ability to identify and develop effective coping behaviors will improve, Compliance with prescribed medications will improve, and Ability to identify triggers associated with substance abuse/mental health issues will improve   Diagnoses: Schizoaffective d/o, bipolar type R/O Substance induced mood d/o Principal Problem:   Schizoaffective disorder, bipolar type ([place]) Active Problems:   Hypertension   OA (osteoarthritis) of knee   Insomnia   GERD (gastroesophageal reflux disease)   History of DVT of lower extremity   History of pulmonary embolism   GAD (generalized anxiety disorder)  Stimulant use d/o Trichomonas by wet prep prior to admission - untreated                    Medications -Continue Norvasc 5 mg daily for htn (consulting medicine for recommendations given LE edema on this med and while off Lasix and Clonidine) -Continue Citalopram 20 mg daily for depression -Stop Eliquis 5 mg BID based on  review of records and attending discussion with her PCP has completed treatment -Continue Amitiza 24 mcg BID for IBS -Continue Lyrica 150 mg BID for nerve pain (discussed that some treatment centers may not allow her to attend on this med) -Continue Risperdal 0.5 mg  in the mornings and continue nightly dose of 1 mg nightly for psychosis -Continue Zanaflex 4 mg Q8Hs PRN for muscle pain -Continue Melatonin 3 mg nightly for insomnia -Continue Protonix 40 mg daily for GERD -Continue albuterol PRN for wheezinf/SOB -Continue Hydroxyzine 25 mg every 6 hours PRN -Continue Dulera BID for COPD -Continue Vit D 50,000 weekly for Vit D deficiency - Start B12 1026mg daily for Vit B12 deficiency -Continue Colace daily for constipation - Continue Flagyl '500mg'$  bid for 7 days for trichomonas - Continue Agrylin '1mg'$  bid - home med for elevated platelets - confirmed based on hematology notes and attending discussion with PCP   Labs Reviewed: Vit D low 9.21, Vit B12 low at 154, A1c 5.5, TSH 0.952, Lipid panel WNL, UA negative except trace leukocytes; CBC WNL except for WBC 10.8 and platelets 624; CMP WNL except for total protein 8.5. Will need PCP f/u after discharge, but as per documentation in charts, pt has a clotting d/o. EKG with [id number]   Discharge Planning: Social work and case management to assist with discharge planning and identification of hospital follow-up needs prior to discharge Estimated LOS: 5-7 days Discharge Concerns: Need to establish a safety plan; Medication compliance and effectiveness Discharge Goals: Return home with outpatient referrals for mental health follow-up including medication management/psychotherapy  DNicholes Rough NP 10/20/2021, 1:39 PMPatient ID: [id number] Lango female   DOB: 1Jun 25, 1972 51y.o.   MRN: [id number]

## 2021-10-20 NOTE — Telephone Encounter (Signed)
Tried calling again- rings and goes straight to busy signal. Letter mailed.

## 2021-10-21 ENCOUNTER — Ambulatory Visit: Payer: Medicaid Other | Admitting: Internal Medicine

## 2021-10-21 LAB — BASIC METABOLIC PANEL
Anion gap: 8 (ref 5–15)
BUN: 11 mg/dL (ref 6–20)
CO2: 23 mmol/L (ref 22–32)
Calcium: 9.1 mg/dL (ref 8.9–10.3)
Chloride: 108 mmol/L (ref 98–111)
Creatinine, Ser: 0.75 mg/dL (ref 0.44–1.00)
GFR, Estimated: >60 mL/min (ref >60)
Glucose, Bld: 86 mg/dL (ref 70–99)
Potassium: 3.9 mmol/L (ref 3.5–5.1)
Sodium: 139 mmol/L (ref 135–145)

## 2021-10-21 MED ORDER — FUROSEMIDE 20 MG PO TABS
20.0000 mg | ORAL_TABLET | Freq: Once | ORAL | Status: AC
  Administered 2021-10-21: 20 mg via ORAL
  Filled 2021-10-21 (×2): qty 1

## 2021-10-21 NOTE — Progress Notes (Signed)
[facility] MD Progress Note  10/21/2021 10:52 AM SHELLBY SCHLINK  MRN:  [id number]  Subjective:  Soley states: "Last night was rough, but I feel a little better now. My right knee is deteriorated and they supposed to do surgery. They're waiting for the lung and the heart doctor to sign off on it.Marland KitchenMarland KitchenThe only thing that helps the pain in my knee is Oxycodone, and I've got to get to [place] to get a prescription for that. I'm okay, no suicide thoughts or anything like that."  Reason For Admission:  MALLISA ALAMEDA is a 51 y.o. female with past history significant for bipolar d/o, GAD, MDD, polysubstance use, hypertension, asthma, COPD, T2DM, PE on Eliquis, GERD as well as a reported history of schizophrenia who presented to the [facility] due to worsening depressive symptoms and SI, with a plan to use a knife to cut her neck. Pt was transferred to this [facility] voluntarily for treatment and stabilization of her mood.  Today's patient assessment note: Pt's chart reviewed, her case discussed with her treatment team. As per the [facility], she is compliant with her scheduled medications, and as per flow sheets, she slept a total of 7.75 hrs last night. She required a dose of Melatonin 3 mg last night for insomnia. As per nursing documentation, she has been cooperative with care and appropriate in the milieu.  Pt continues to present today with a euthymic mood, and affect is congruent. Attention to personal hygiene and grooming is fair, eye contact is good, speech is clear & coherent. Thought contents are organized and logical, and pt currently denies SI/HI/AVH or paranoia. There is no evidence of delusional thoughts. Pt reports that she had a rough night last night, because she felt like both of her knees were "giving out". She reports severe pain to both knees, states that she is in need of a right knee replacement, but is needing for her pulmonologist and her Cardiologist to approve for the surgery to be done.  She reports that she plans on following this up outpatient.   Pt reports that she plans on going back home to her boyfriend "Mr Charissa Bash", since she has been declined entry into some rehabs due to her physical limitations. She is continuing to have +1 pitting edema to B/L LE, and attending Psychiatrist reach out to pt's outpatient provider  (Dr Posey Pronto) who recommended Lasix PRN. Pt received a dose last night and another Lasix 20 mg x 1 dose has been ordered this morning, as the edema is the same. She has been educated on the need to keep her feel elevated whenever possible and has verbalized understanding. Will continue all meds as listed below.  Principal Problem: Schizoaffective disorder, bipolar type ([place]) Diagnosis: Principal Problem:   Schizoaffective disorder, bipolar type ([place]) Active Problems:   Hypertension   OA (osteoarthritis) of knee   Insomnia   GERD (gastroesophageal reflux disease)   History of DVT of lower extremity   History of pulmonary embolism   GAD (generalized anxiety disorder)   Polysubstance abuse ([place])   Trichomonas infection  Total Time spent with patient: 30 minutes  Past Psychiatric History: As above  Past Medical History:  Past Medical History:  Diagnosis Date   Anxiety    Arthritis    Asthma    Bipolar disorder (HCC)    Chronic back pain    COPD (chronic obstructive pulmonary disease) (HCC)    Chronic bronchitis   Depression    Dyspnea  GERD (gastroesophageal reflux disease)    Hypertension    Migraine    Neuropathy    Osteoarthritis of left knee, 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   Single subsegmental pulmonary embolism without acute cor pulmonale ([place]) 06/20/2021   Sleep apnea 04/2020   GETTING A cpap   Suicidal ideation 12/24/2018    Past Surgical History:  Procedure Laterality Date   BACK SURGERY     DILATATION AND CURETTAGE/HYSTEROSCOPY WITH MINERVA N/A 06/09/2020   Procedure: DILATATION AND CURETTAGE/HYSTEROSCOPY WITH MINERVA;  Surgeon:  Florian Buff, MD;  Location: [facility] ORS;  Service: Gynecology;  Laterality: N/A;   ESOPHAGOGASTRODUODENOSCOPY (EGD) WITH PROPOFOL N/A 12/25/2018   Procedure: ESOPHAGOGASTRODUODENOSCOPY (EGD) WITH PROPOFOL;  Surgeon: Rogene Houston, MD;  Location: [facility] ENDO SUITE;  Service: Endoscopy;  Laterality: N/A;   FLEXIBLE SIGMOIDOSCOPY  10/14/2021   Procedure: FLEXIBLE SIGMOIDOSCOPY;  Surgeon: Harvel Quale, MD;  Location: [facility] ENDO SUITE;  Service: Gastroenterology;;   PATELLA-FEMORAL ARTHROPLASTY Left 10/08/2018   Procedure: PATELLA-FEMORAL ARTHROPLASTY;  Surgeon: Marchia Bond, MD;  Location: [facility] ORS;  Service: Orthopedics;  Laterality: Left;   TUBAL LIGATION     Family History:  Family History  Problem Relation Age of Onset   Gout Paternal Grandfather    Cirrhosis Paternal Grandfather    Hypertension Paternal Grandmother    Aneurysm Paternal Grandmother    Cirrhosis Maternal Grandmother    Cirrhosis Maternal Grandfather    Cancer Father    Cirrhosis Father    Cirrhosis Mother    Breast cancer Sister    Hypertension Sister    Bronchitis Daughter    Bronchitis Daughter    Asthma Son    Bronchitis Son    Migraines Neg Hx    Family Psychiatric  History: as above Social History:  Social History   Substance and Sexual Activity  Alcohol Use Not Currently     Social History   Substance and Sexual Activity  Drug Use Not Currently   Types: Cocaine   Comment: crack  last used June 2023    Social History   Socioeconomic History   Marital status: Single    Spouse name: Not on file   Number of children: Not on file   Years of education: Not on file   Highest education level: Not on file  Occupational History   Not on file  Tobacco Use   Smoking status: Former    Packs/day: 3.00    Years: 5.00    Total pack years: 15.00    Types: Cigarettes    Passive exposure: Current   Smokeless tobacco: Never   Tobacco comments:    Occasional smoker  Vaping Use   Vaping Use: Never  used  Substance and Sexual Activity   Alcohol use: Not Currently   Drug use: Not Currently    Types: Cocaine    Comment: crack  last used June 2023   Sexual activity: Yes    Birth control/protection: Surgical    Comment: tubal, ablation  Other Topics Concern   Not on file  Social History Narrative   R handed    Lives with boyfriend   1 Cup of caffeine daily    Social Determinants of Health   Financial Resource Strain: Not on file  Food Insecurity: No Food Insecurity (03/07/2021)   Hunger Vital Sign    Worried About Running Out of Food in the Last Year: Never true    Ran Out of Food in the Last Year: Never true  Transportation Needs: No Transportation Needs (  03/07/2021)   PRAPARE - Hydrologist (Medical): No    Lack of Transportation (Non-Medical): No  Physical Activity: Not on file  Stress: Not on file  Social Connections: Not on file   Additional Social History:   Sleep: Fair  Appetite:  Good  Current Medications: Current Facility-Administered Medications  Medication Dose Route Frequency Provider Last Rate Last Admin   albuterol (PROVENTIL) (2.5 MG/3ML) 0.083% nebulizer solution 2.5 mg  2.5 mg Nebulization Q6H PRN Merlyn Lot E, NP       albuterol (VENTOLIN HFA) 108 (90 Base) MCG/ACT inhaler 2 puff  2 puff Inhalation Q6H PRN Nelda Marseille, Amy E, MD       amLODipine (NORVASC) tablet 5 mg  5 mg Oral Daily Merlyn Lot E, NP   5 mg at 10/21/21 0835   anagrelide (AGRYLIN) capsule 1 mg  1 mg Oral BID Merlyn Lot E, NP   1 mg at 10/21/21 3536   benztropine (COGENTIN) tablet 0.5 mg  0.5 mg Oral BID PRN Harlow Asa, MD       citalopram (CELEXA) tablet 20 mg  20 mg Oral Daily Merlyn Lot E, NP   20 mg at 10/21/21 1443   furosemide (LASIX) tablet 20 mg  20 mg Oral Once Harlow Asa, MD       hydrOXYzine (ATARAX) tablet 25 mg  25 mg Oral TID PRN Harlow Asa, MD   25 mg at 10/19/21 0807   lubiprostone (AMITIZA) capsule 24 mcg  24 mcg  Oral BID WC Merlyn Lot E, NP   24 mcg at 10/21/21 0835   melatonin tablet 3 mg  3 mg Oral QHS Daira Hine, NP   3 mg at 10/20/21 2116   metroNIDAZOLE (FLAGYL) tablet 500 mg  500 mg Oral Q12H Nelda Marseille, Amy E, MD   500 mg at 10/21/21 0834   mometasone-formoterol (DULERA) 200-5 MCG/ACT inhaler 2 puff  2 puff Inhalation BID Harlow Asa, MD   2 puff at 10/21/21 0832   pantoprazole (PROTONIX) EC tablet 40 mg  40 mg Oral Daily Viann Fish E, MD   40 mg at 10/21/21 0833   pregabalin (LYRICA) capsule 150 mg  150 mg Oral BID Merlyn Lot E, NP   150 mg at 10/21/21 1540   risperiDONE (RISPERDAL) tablet 0.5 mg  0.5 mg Oral Daily Nicholes Rough, NP   0.5 mg at 10/21/21 0834   risperiDONE (RISPERDAL) tablet 1 mg  1 mg Oral QHS Dinesha Twiggs, NP   1 mg at 10/20/21 2116   tiZANidine (ZANAFLEX) tablet 4 mg  4 mg Oral Q8H PRN Merlyn Lot E, NP   4 mg at 10/20/21 2208   vitamin B-12 (CYANOCOBALAMIN) tablet 1,000 mcg  1,000 mcg Oral Daily Harlow Asa, MD   1,000 mcg at 10/21/21 0867   Vitamin D (Ergocalciferol) (DRISDOL) capsule 50,000 Units  50,000 Units Oral Q7 days Harlow Asa, MD   50,000 Units at 10/19/21 1620    Lab Results:  Results for orders placed or performed during the hospital encounter of 10/17/21 (from the past 48 hour(s))  Glucose, capillary     Status: Abnormal   Collection Time: 10/19/21 12:08 PM  Result Value Ref Range   Glucose-Capillary 102 (H) 70 - 99 mg/dL    Comment: Glucose reference range applies only to samples taken after fasting for at least 8 hours.   Comment 1 Notify RN   Basic metabolic panel     Status:  None   Collection Time: 10/21/21  6:37 AM  Result Value Ref Range   Sodium 139 135 - 145 mmol/L   Potassium 3.9 3.5 - 5.1 mmol/L   Chloride 108 98 - 111 mmol/L   CO2 23 22 - 32 mmol/L   Glucose, Bld 86 70 - 99 mg/dL    Comment: Glucose reference range applies only to samples taken after fasting for at least 8 hours.   BUN 11 6 - 20 mg/dL    Creatinine, Ser 0.75 0.44 - 1.00 mg/dL   Calcium 9.1 8.9 - 10.3 mg/dL   GFR, Estimated >60 >60 mL/min    Comment: (NOTE) Calculated using the CKD-EPI Creatinine Equation (2021)    Anion gap 8 5 - 15    Comment: Performed at [facility], [place] [address], [place], [place] [address]    Blood Alcohol level:  Lab Results  Component Value Date   ETH <10 10/16/2021   ETH <10 65/99/3570    Metabolic Disorder Labs: Lab Results  Component Value Date   HGBA1C 5.5 10/19/2021   MPG 111.15 10/19/2021   MPG 108 04/20/2017   No results found for: "PROLACTIN" Lab Results  Component Value Date   CHOL 148 10/19/2021   TRIG 41 10/19/2021   HDL 56 10/19/2021   CHOLHDL 2.6 10/19/2021   VLDL 8 10/19/2021   LDLCALC 84 10/19/2021   LDLCALC 72 04/20/2017    Physical Findings: AIMS: Facial and Oral Movements Muscles of Facial Expression: None, normal Lips and Perioral Area: None, normal Jaw: None, normal Tongue: None, normal,Extremity Movements Upper (arms, wrists, hands, fingers): None, normal Lower (legs, knees, ankles, toes): None, normal, Trunk Movements Neck, shoulders, hips: None, normal, Overall Severity Severity of abnormal movements (highest score from questions above): None, normal Incapacitation due to abnormal movements: None, normal Patient's awareness of abnormal movements (rate only patient's report): No Awareness, Dental Status Current problems with teeth and/or dentures?: No Does patient usually wear dentures?: No  CIWA:   n/a COWS:   n/a  Musculoskeletal: Strength & Muscle Tone: decreased Gait & Station: normal Patient leans: N/A  Psychiatric Specialty Exam:  Presentation  General Appearance: Appropriate for Environment; Fairly Groomed  Eye Contact:Good  Speech:Clear and Coherent  Speech Volume:Normal  Handedness:Right  Mood and Affect  Mood:Euthymic  Affect:Appropriate; Congruent  Thought Process  Thought  Processes:Coherent  Descriptions of Associations:Intact  Orientation:Full (Time, Place and Person)  Thought Content:Logical  History of Schizophrenia/Schizoaffective disorder:Yes  Duration of Psychotic Symptoms:N/A  Hallucinations:Hallucinations: None; Auditory Description of Auditory Hallucinations: none at this time Description of Visual Hallucinations: none at this time  Ideas of Reference:None  Suicidal Thoughts:Suicidal Thoughts: No  Homicidal Thoughts:Homicidal Thoughts: No  Sensorium  Memory:Immediate Good  Judgment:Good  Insight:Good  Executive Functions  Concentration:Good  Attention Span:Good  [place] of Knowledge:Good  Language:Good  Psychomotor Activity  Psychomotor Activity:Psychomotor Activity: Normal  Assets  Assets:Communication Skills  Sleep  Sleep:Sleep: Fair  Physical Exam: Physical Exam Constitutional:      Appearance: She is obese.  HENT:     Head: Normocephalic.     Nose: Nose normal.  Eyes:     Pupils: Pupils are equal, round, and reactive to light.  Pulmonary:     Effort: Pulmonary effort is normal.  Musculoskeletal:     Cervical back: Normal range of motion.     Right lower leg: Edema present.     Left lower leg: Edema present.  Neurological:     Mental Status: She is  alert and oriented to person, place, and time.     Sensory: No sensory deficit.  Psychiatric:        Behavior: Behavior normal.    Review of Systems  Constitutional: Negative.  Negative for fever.  HENT: Negative.    Eyes: Negative.   Respiratory: Negative.  Negative for cough.   Cardiovascular: Negative.  Negative for chest pain.  Gastrointestinal: Negative.  Negative for heartburn.  Genitourinary: Negative.   Musculoskeletal: Negative.   Skin: Negative.   Psychiatric/Behavioral:  Positive for depression (Resolving on current meds) and substance abuse. Negative for hallucinations, memory loss and suicidal ideas. The patient is  nervous/anxious (resolving with medications) and has insomnia (resolving with current medications).    Blood pressure 133/83, pulse 81, temperature (!) 97.5 F (36.4 C), temperature source Oral, resp. rate 20, height 4' 11.5" (1.511 m), weight 83.2 kg, last menstrual period 06/07/2020, SpO2 99 %. Body mass index is 36.42 kg/m.  Treatment Plan Summary: Daily contact with patient to assess and evaluate symptoms and progress in treatment and Medication management   Observation Level/Precautions:  15 minute checks  Laboratory:  Labs reviewed   Psychotherapy:  Unit Group sessions  Medications:  See [facility]  Consultations:  To be determined   Discharge Concerns:  Safety, medication compliance, mood stability  Estimated LOS: 5-7 days  Other:  N/A    PLAN Safety and Monitoring: Voluntary admission to inpatient psychiatric unit for safety, stabilization and treatment Daily contact with patient to assess and evaluate symptoms and progress in treatment Patient's case to be discussed in multi-disciplinary team meeting Observation Level : q15 minute checks Vital signs: q12 hours Precautions: Safety   Long Term Goal(s): Improvement in symptoms so as ready for discharge   Short Term Goals: Ability to identify changes in lifestyle to reduce recurrence of condition will improve, Ability to disclose and discuss suicidal ideas, Ability to demonstrate self-control will improve, Ability to identify and develop effective coping behaviors will improve, Compliance with prescribed medications will improve, and Ability to identify triggers associated with substance abuse/mental health issues will improve   Diagnoses: Schizoaffective d/o, bipolar type R/O Substance induced mood d/o Principal Problem:   Schizoaffective disorder, bipolar type ([place]) Active Problems:   Hypertension   OA (osteoarthritis) of knee   Insomnia   GERD (gastroesophageal reflux disease)   History of DVT of lower extremity   History of  pulmonary embolism   GAD (generalized anxiety disorder)  Stimulant use d/o Trichomonas by wet prep prior to admission - untreated                    Medications -Continue Norvasc 5 mg daily for htn (consulted medicine yesterday for recommendations given LE edema on this med and while off Lasix and Clonidine. Writer spoke with Dr. Marylyn Ishihara from medicine yesterday who recommended going with recommendations from pt's outpatient provider) -Continue Citalopram 20 mg daily for depression -Stopped Eliquis 5 mg BID based on review of records and attending discussion with her PCP has completed treatment -Continue Amitiza 24 mcg BID for IBS -Continue Lyrica 150 mg BID for nerve pain (discussed that some treatment centers may not allow her to attend on this med) -Continue Risperdal 0.5 mg  in the mornings and continue nightly dose of 1 mg nightly for psychosis -Continue Zanaflex 4 mg Q8Hs PRN for muscle pain -Continue Melatonin 3 mg nightly for insomnia -Continue Protonix 40 mg daily for GERD -Continue albuterol PRN for wheezinf/SOB -Continue Hydroxyzine 25 mg every 6  hours PRN -Continue Dulera BID for COPD -Continue Vit D 50,000 weekly for Vit D deficiency - Continue B12 1023mg daily for Vit B12 deficiency -Continue Colace daily for constipation - Continue Flagyl '500mg'$  bid for 7 days for trichomonas - Continue Agrylin '1mg'$  bid - home med for elevated platelets - confirmed based on hematology notes and attending discussion with PCP   Labs Reviewed: Vit D low 9.21, Vit B12 low at 154, A1c 5.5, TSH 0.952, Lipid panel WNL, UA negative except trace leukocytes; CBC WNL except for WBC 10.8 and platelets 624; CMP WNL except for total protein 8.5. Will need PCP f/u after discharge, but as per documentation in charts, pt has a clotting d/o. EKG with [id number]   Discharge Planning: Social work and case management to assist with discharge planning and identification of hospital follow-up needs prior to  discharge Estimated LOS: 5-7 days Discharge Concerns: Need to establish a safety plan; Medication compliance and effectiveness Discharge Goals: Return home with outpatient referrals for mental health follow-up including medication management/psychotherapy  DNicholes Rough NP 10/21/2021, 10:52 AMPatient ID: CFerdinand Lango female   DOB: 1Sep 08, 1972 51y.o.   MRN: [id number] ID: CAZARIE CORIZ female   DOB: [birth year] 51y.o.   MRN: [id number]

## 2021-10-21 NOTE — Progress Notes (Signed)
Received report from tech on the hall that pt was coughing a lot in her sleep. Now pt is asleep and not coughing anymore. Pt has history of COPD. Pt had her Dulera tonight. Will continue to monitor. If pt wakes up, will ask if she needs her rescue inhaler.

## 2021-10-21 NOTE — [facility] Group Notes (Signed)
Pt attended orientation/goals group. MHT went over schedule for today with patients. Pt goal for today is to focus on getting in a rehab

## 2021-10-21 NOTE — Progress Notes (Signed)
Patient denies SI, HI, and AVH this shift. Patient has been compliant with medications, engaged in groups and participated in unit activities with no dyscontrol.   Assess patient for safety, administer medications as prescribed, engage patient in 1:1 staff talks.   PAtient able to contract for safety, Continue to monitor as planned.

## 2021-10-21 NOTE — Group Note (Signed)
Recreation Therapy Group Note   Group Topic:Team Building  Group Date: 10/21/2021 Start Time: 0930 End Time: 1000 Facilitators: Victorino Sparrow, LRT,CTRS Location: [address] Dayroom   Goal Area(s) Addresses:  Patient will effectively work with peer towards shared goal.  Patient will identify skill used to make activity successful.  Patient will identify how skills used during activity can be used to reach post d/c goals.   Group Description:  Patient(s) were given a set of solo cups, a rubber band, and some tied strings. The objective is to build a pyramid with the cups by only using the rubber band and string to move the cups. After the activity the patient(s) and LRT debriefed and discussed what strategies worked, what didn't, and what lessons they can take from the activity and use in life post discharge.    Affect/Mood: Appropriate   Participation Level: Engaged   Participation Quality: Independent   Behavior: Appropriate   Speech/Thought Process: Focused   Insight: Good   Judgement: Good   Modes of Intervention: Team-building   Patient Response to Interventions:  Engaged   Education Outcome:  Acknowledges education and In group clarification offered    Clinical Observations/Individualized Feedback: Pt actively participated in group.  Pt was also the leader of her group.    Plan: Continue to engage patient in RT group sessions 2-3x/week.   Victorino Sparrow, LRT,CTRS 10/21/2021 12:06 PM

## 2021-10-21 NOTE — Progress Notes (Signed)
   10/21/21 2140  Psych Admission Type (Psych Patients Only)  Admission Status Voluntary  Psychosocial Assessment  Patient Complaints None  Eye Contact Fair  Facial Expression Anxious  Affect Appropriate to circumstance  Speech Logical/coherent  Interaction Assertive  Motor Activity Slow  Appearance/Hygiene Unremarkable  Behavior Characteristics Cooperative;Appropriate to situation  Mood Pleasant  Thought Process  Coherency WDL  Content WDL  Delusions None reported or observed  Perception WDL  Hallucination None reported or observed  Judgment Poor  Confusion None  Danger to Self  Current suicidal ideation? Denies

## 2021-10-21 NOTE — B[facility] Counselor (Signed)
CSW provided the Pt with a packet that contains information including shelter and housing resources, free and reduced [place] food information, clothing resources, crisis center information, a [employer] card, and suicide prevention information.   

## 2021-10-21 NOTE — Group Note (Signed)
LCSW Group Therapy Note   Group Date: 10/21/2021 Start Time: 1300 End Time: 1400   Type of Therapy and Topic:  Group Therapy: Boundaries  Participation Level:  Did Not Attend  Description of Group: This group will address the use of boundaries in their personal lives. Patients will explore why boundaries are important, the difference between healthy and unhealthy boundaries, and negative and postive outcomes of different boundaries and will look at how boundaries can be crossed.  Patients will be encouraged to identify current boundaries in their own lives and identify what kind of boundary is being set. Facilitators will guide patients in utilizing problem-solving interventions to address and correct types boundaries being used and to address when no boundary is being used. Understanding and applying boundaries will be explored and addressed for obtaining and maintaining a balanced life. Patients will be encouraged to explore ways to assertively make their boundaries and needs known to significant others in their lives, using other group members and facilitator for role play, support, and feedback.  Therapeutic Goals:  1.  Patient will identify areas in their life where setting clear boundaries could be  used to improve their life.  2.  Patient will identify signs/triggers that a boundary is not being respected. 3.  Patient will identify two ways to set boundaries in order to achieve balance in  their lives: 4.  Patient will demonstrate ability to communicate their needs and set boundaries  through discussion and/or role plays  Summary of Patient Progress:  Did not attend   Therapeutic Modalities:   Cognitive [place], [place] 10/21/2021  2:02 PM

## 2021-10-22 DIAGNOSIS — F250 Schizoaffective disorder, bipolar type: Principal | ICD-10-CM

## 2021-10-22 MED ORDER — PANTOPRAZOLE SODIUM 40 MG PO TBEC: 40.0000 mg | DELAYED_RELEASE_TABLET | Freq: Every day | ORAL | 0 refills | Status: DC

## 2021-10-22 MED ORDER — RISPERIDONE 1 MG PO TABS: 1.0000 mg | ORAL_TABLET | Freq: Every day | ORAL | 0 refills | Status: DC

## 2021-10-22 MED ORDER — MELATONIN 3 MG PO TABS: 3.0000 mg | ORAL_TABLET | Freq: Every day | ORAL | 0 refills | Status: DC

## 2021-10-22 MED ORDER — METRONIDAZOLE 500 MG PO TABS: 500.0000 mg | ORAL_TABLET | Freq: Two times a day (BID) | ORAL | 0 refills | Status: DC

## 2021-10-22 MED ORDER — BENZTROPINE MESYLATE 0.5 MG PO TABS: 0.5000 mg | ORAL_TABLET | Freq: Two times a day (BID) | ORAL | 0 refills | Status: DC | PRN

## 2021-10-22 MED ORDER — VITAMIN D (ERGOCALCIFEROL) 1.25 MG (50000 UNIT) PO CAPS: 50000.0000 [IU] | ORAL_CAPSULE | ORAL | 0 refills | Status: DC

## 2021-10-22 MED ORDER — AMLODIPINE BESYLATE 5 MG PO TABS: 5.0000 mg | ORAL_TABLET | Freq: Every day | ORAL | 0 refills | Status: DC

## 2021-10-22 MED ORDER — RISPERIDONE 0.5 MG PO TABS: 0.5000 mg | ORAL_TABLET | Freq: Every day | ORAL | 0 refills | Status: DC

## 2021-10-22 MED ORDER — ANAGRELIDE HCL 1 MG PO CAPS: 1.0000 mg | ORAL_CAPSULE | Freq: Two times a day (BID) | ORAL | 0 refills | Status: DC

## 2021-10-22 MED ORDER — MOMETASONE FURO-FORMOTEROL FUM 200-5 MCG/ACT IN AERO: 2.0000 | INHALATION_SPRAY | Freq: Two times a day (BID) | RESPIRATORY_TRACT | 0 refills | Status: DC

## 2021-10-22 MED ORDER — LUBIPROSTONE 24 MCG PO CAPS
24.0000 ug | ORAL_CAPSULE | Freq: Two times a day (BID) | ORAL | 0 refills | Status: DC
Start: 2021-10-22 — End: 2022-07-10

## 2021-10-22 MED ORDER — CITALOPRAM HYDROBROMIDE 20 MG PO TABS
20.0000 mg | ORAL_TABLET | Freq: Every day | ORAL | 0 refills | Status: DC
Start: 2021-10-22 — End: 2022-01-15

## 2021-10-22 MED ORDER — CYANOCOBALAMIN 1000 MCG PO TABS: 1000.0000 ug | ORAL_TABLET | Freq: Every day | ORAL | 0 refills | Status: DC

## 2021-10-22 NOTE — Discharge Summary (Addendum)
Physician Discharge Summary Note  Patient:  Kathryn [name] is an 51 y.o., female MRN:  [id number] DOB:  26-Jan-1971 Patient phone:  [phone] (home)  Patient address:   [facility] [place] [facility] [id number],  Total Time spent with patient: 30 minutes  Date of Admission:  10/17/2021 Date of Discharge: 10/22/2021  Reason for Admission: Kathryn [name] is a 51 y.o. female with past history significant for bipolar d/o, GAD, MDD, polysubstance use, hypertension, asthma, COPD, T2DM, PE on Eliquis, GERD as well as a reported history of schizophrenia who presented to the [facility] hospital due to worsening depressive symptoms and SI, with a plan to use a knife to cut her neck. Pt was transferred to this [facility] voluntarily for treatment and stabilization of her mood.  Principal Problem: Schizoaffective disorder, bipolar type [name]) Discharge Diagnoses: Principal Problem:   Schizoaffective disorder, bipolar type ([place]) Active Problems:   Hypertension   OA (osteoarthritis) of knee   Insomnia   GERD (gastroesophageal reflux disease)   History of DVT of lower extremity   History of pulmonary embolism   GAD (generalized anxiety disorder)   Polysubstance abuse ([place])   Trichomonas infection  Past Psychiatric History: As above  Past Medical History:  Past Medical History:  Diagnosis Date   Anxiety    Arthritis    Asthma    Bipolar disorder ([place])    Chronic back pain    COPD (chronic obstructive pulmonary disease) (HCC)    Chronic bronchitis   Depression    Dyspnea    GERD (gastroesophageal reflux disease)    Hypertension    Migraine    Neuropathy    Osteoarthritis of left knee, 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   Single subsegmental pulmonary embolism without acute cor pulmonale ([place]) 06/20/2021   Sleep apnea 04/2020   GETTING A cpap   Suicidal ideation 12/24/2018    Past Surgical History:  Procedure Laterality Date   BACK SURGERY     DILATATION AND CURETTAGE/HYSTEROSCOPY WITH MINERVA  N/A 06/09/2020   Procedure: DILATATION AND CURETTAGE/HYSTEROSCOPY WITH MINERVA;  Surgeon: Florian Buff, MD;  Location: [facility] OR[facility];  Service: Gynecology;  Laterality: N/A;   ESOPHAGOGASTRODUODENOSCOPY (EGD) WITH PROPOFOL N/A 12/25/2018   Procedure: ESOPHAGOGASTRODUODENOSCOPY (EGD) WITH PROPOFOL;  Surgeon: Rogene Houston, MD;  Location: [facility] SU[facility];  Service: Endoscopy;  Laterality: N/A;   FLEXIBLE SIGMOIDOSCOPY  10/14/2021   Procedure: FLEXIBLE SIGMOIDOSCOPY;  Surgeon: Harvel Quale, MD;  Location: [facility] END[facility];  Service: Gastroenterology;;   PATELLA-FEMORAL ARTHROPLASTY Left 10/08/2018   Procedure: PATELLA-FEMORAL ARTHROPLASTY;  Surgeon: Marchia Bond, MD;  Location: [facility] ORS;  Service: Orthopedics;  Laterality: Left;   TUBAL LIGATION     Family History:  Family History  Problem Relation Age of Onset   Gout Paternal Grandfather    Cirrhosis Paternal Grandfather    Hypertension Paternal Grandmother    Aneurysm Paternal Grandmother    Cirrhosis Maternal Grandmother    Cirrhosis Maternal Grandfather    Cancer Father    Cirrhosis Father    Cirrhosis Mother    Breast cancer Sister    Hypertension Sister    Bronchitis Daughter    Bronchitis Daughter    Asthma Son    Bronchitis Son    Migraines Neg Hx    Family Psychiatric  History: none reported Social History:  Social History   Substance and Sexual Activity  Alcohol Use Not Currently     Social History   Substance and Sexual Activity  Drug Use Not Currently  Types: Cocaine   Comment: crack  last used June 2023    Social History   Socioeconomic History   Marital status: Single    Spouse name: Not on file   Number of children: Not on file   Years of education: Not on file   Highest education level: Not on file  Occupational History   Not on file  Tobacco Use   Smoking status: Former    Packs/day: 3.00    Years: 5.00    Total pack years: 15.00    Types: Cigarettes    Passive exposure: Current    Smokeless tobacco: Never   Tobacco comments:    Occasional smoker  Vaping Use   Vaping Use: Never used  Substance and Sexual Activity   Alcohol use: Not Currently   Drug use: Not Currently    Types: Cocaine    Comment: crack  last used June 2023   Sexual activity: Yes    Birth control/protection: Surgical    Comment: tubal, ablation  Other Topics Concern   Not on file  Social History Narrative   R handed    Lives with boyfriend   1 Cup of caffeine daily    Social Determinants of Health   Financial Resource Strain: Not on file  Food Insecurity: No Food Insecurity (03/07/2021)   Hunger Vital Sign    Worried About Running Out of Food in the Last Year: Never true    Ran Out of Food in the Last Year: Never true  Transportation Needs: No Transportation Needs (03/07/2021)   PRAPARE - Hydrologist (Medical): No    Lack of Transportation (Non-Medical): No  Physical Activity: Not on file  Stress: Not on file  Social Connections: Not on file                                            HOSPITAL COURSE During the patient's hospitalization, patient had extensive initial psychiatric evaluation, and follow-up psychiatric evaluations every day.  Psychiatric diagnoses provided upon initial assessment were as follows:  Principal Problem:   Schizoaffective disorder, bipolar type ([place]) Active Problems:   Hypertension   OA (osteoarthritis) of knee   Insomnia   GERD (gastroesophageal reflux disease)   History of DVT of lower extremity   History of pulmonary embolism   GAD (generalized anxiety disorder)   Cocaine use disorder (HCC)   Patient's psychiatric medications were adjusted on admission as follows: -Continue Norvasc 5 mg daily for htn -Continue Citalopram 20 mg daily for depression -Continue Eliquis 5 mg BID for blood clots -Continue Amitiza 24 mcg BID for IBS -Continue Lyrica 150 mg BID for nerve pain -Continue Risperdal 0.5 mg BID for  psychosis -Continue Zanaflex 4 mg Q8Hs PRN for muscle pain -Continue Melatonin 3 mg nightly for insomnia -Start Protonix 40 mg daily for GERD -Continue albuterol PRN for wheezinf/SOB -Continue Hydroxyzine 25 mg every 6 hours PRN -Start Dulera BID for COPD -Held off Clonidine since Bps were mostly WNL or low   During the hospitalization, other adjustments were made to the patient's psychiatric medication regimen as follows:    -Continue Norvasc 5 mg daily for htn (Pt's outpatient provider contacted by attending Psychiatrist due to LE edema. Recommendations were given for pt to take Lasix 20 mg PRN for edema. Pt was given Lasix 20 mg x 2 doses while at  [place] for edema to B/L LEs.  She has been educated to take Lasix 20 mg PRN along with Potassium 20 MEQ only when she takes Lasix. Pt is not being discharged with Potassium or Lasix as she states that she still has these meds at home) -Continue Citalopram 20 mg daily for depression -Stopped Eliquis 5 mg BID based on review of records and attending discussion with her PCP has completed treatment -Continue Amitiza 24 mcg BID for IBS -Continue Lyrica 150 mg BID for nerve pain (not prescribed, controlled pt has med at home) -Continue Risperdal 0.5 mg  in the mornings and continue nightly dose of 1 mg nightly for psychosis -Continue Zanaflex 4 mg Q8Hs PRN for muscle pain (not prescribed, states has med at home) -Continue Melatonin 3 mg nightly for insomnia -Continue Protonix 40 mg daily for GERD -Continue albuterol PRN for wheezing/SOB -Continue Hydroxyzine 25 mg every 6 hours PRN -Continue Dulera BID for COPD -Continue Vit D 50,000 weekly for Vit D deficiency -Continue B12 1070mg daily for Vit B12 deficiency -Continue Colace daily for constipation (OTC) -Continue Flagyl 507mbid for 7 days for trichomonas -Continue Agrylin 21m48mid - home med for elevated platelets - confirmed based on hematology notes and attending discussion with PCP   Patient's  care was discussed during the interdisciplinary team meeting every day during the hospitalization. The patient denies having side effects to prescribed psychiatric medication. The patient was evaluated each day by a clinical provider to ascertain response to treatment. Improvement was noted by the patient's report of decreasing symptoms, improved sleep and appetite, affect, medication tolerance, behavior, and participation in unit programming.  Patient was asked each day to complete a self inventory noting mood, mental status, pain, new symptoms, anxiety and concerns.     Symptoms were reported as significantly decreased or resolved completely by discharge. On day of discharge, the patient reports that their mood is stable. The patient denied having suicidal thoughts for more than 48 hours prior to discharge.  Patient denies having homicidal thoughts.  Patient denies having auditory hallucinations.  Patient denies any visual hallucinations or other symptoms of psychosis. The patient was motivated to continue taking medication with a goal of continued improvement in mental health.    The patient reports their target psychiatric symptoms of depression, anxiety and insomnia responded well to the psychiatric medications, and the patient reports overall benefit from this psychiatric hospitalization. Supportive psychotherapy was provided to the patient. The patient also participated in regular group therapy while hospitalized. Coping skills, problem solving as well as relaxation therapies were also part of the unit programming.   Labs were reviewed with the patient, and abnormal results were discussed with the patient. Vitamin D and Vitamin B12 are low during this admission, and pt has been educated on the need to continue taking these supplements, and also to follow these up with her Primary care provider.  Physical Findings: AIMS: Facial and Oral Movements Muscles of Facial Expression: None, normal Lips and  Perioral Area: None, normal Jaw: None, normal Tongue: None, normal,Extremity Movements Upper (arms, wrists, hands, fingers): None, normal Lower (legs, knees, ankles, toes): None, normal, Trunk Movements Neck, shoulders, hips: None, normal, Overall Severity Severity of abnormal movements (highest score from questions above): None, normal Incapacitation due to abnormal movements: None, normal Patient's awareness of abnormal movements (rate only patient's report): No Awareness, Dental Status Current problems with teeth and/or dentures?: No Does patient usually wear dentures?: No  CIWA:  n/a  COWS: n/a  Musculoskeletal: Strength & Muscle Tone: within normal limits Gait & Station: normal Patient leans: N/A  Psychiatric Specialty Exam:  Presentation  General Appearance: Appropriate for Environment; Fairly Groomed  Eye Contact:Fair  Speech:Clear and Coherent  Speech Volume:Normal  Handedness:Right  Mood and Affect  Mood:Euthymic  Affect:Congruent  Thought Process  Thought Processes:Coherent  Descriptions of Associations:Intact  Orientation:Full (Time, Place and Person)  Thought Content:Logical  History of Schizophrenia/Schizoaffective disorder:Yes  Duration of Psychotic Symptoms:N/A  Hallucinations:Hallucinations: None Description of Auditory Hallucinations: none at this time Description of Visual Hallucinations: none at this time  Ideas of Reference:None  Suicidal Thoughts:Suicidal Thoughts: No  Homicidal Thoughts:Homicidal Thoughts: No  Sensorium  Memory:Immediate Good  Judgment:Good  Insight:Good  Executive Functions  Concentration:Good  Attention Span:Good  [place] of Knowledge:Good  Language:Good  Psychomotor Activity  Psychomotor Activity:Psychomotor Activity: Normal  Assets  Assets:Communication Skills  Sleep  Sleep:Sleep: Good  Physical Exam: Physical Exam Constitutional:      Appearance: She is obese.  HENT:      Head: Normocephalic.     Nose: Nose normal. No congestion.  Eyes:     Pupils: Pupils are equal, round, and reactive to light.  Musculoskeletal:        General: Normal range of motion.     Cervical back: Normal range of motion.  Neurological:     Mental Status: She is alert and oriented to person, place, and time.     Sensory: No sensory deficit.     Coordination: Coordination normal.    Review of Systems  Constitutional: Negative.   HENT: Negative.    Eyes: Negative.   Respiratory: Negative.    Cardiovascular: Negative.   Gastrointestinal: Negative.   Genitourinary: Negative.   Musculoskeletal: Negative.   Skin: Negative.   Neurological: Negative.   Psychiatric/Behavioral:  Positive for depression (Patient denies SI/HI/AVH and verbally contracts for safety outside of this [facility]) and substance abuse. Negative for hallucinations, memory loss and suicidal ideas. The patient is not nervous/anxious and does not have insomnia (resolved on current medication).    Blood pressure 124/86, pulse 95, temperature 98.5 F (36.9 C), temperature source Oral, resp. rate 20, height 4' 11.5" (1.511 m), weight 83.2 kg, last menstrual period 06/07/2020, SpO2 100 %. Body mass index is 36.42 kg/m.  Social History   Tobacco Use  Smoking Status Former   Packs/day: 3.00   Years: 5.00   Total pack years: 15.00   Types: Cigarettes   Passive exposure: Current  Smokeless Tobacco Never  Tobacco Comments   Occasional smoker   Tobacco Cessation:  N/A, patient does not currently use tobacco products  Blood Alcohol level:  Lab Results  Component Value Date   ETH <10 10/16/2021   ETH <10 03/70/4888   Metabolic Disorder Labs:  Lab Results  Component Value Date   HGBA1C 5.5 10/19/2021   MPG 111.15 10/19/2021   MPG 108 04/20/2017   No results found for: "PROLACTIN" Lab Results  Component Value Date   CHOL 148 10/19/2021   TRIG 41 10/19/2021   HDL 56 10/19/2021   CHOLHDL 2.6 10/19/2021    VLDL 8 10/19/2021   LDLCALC 84 10/19/2021   LDLCALC 72 04/20/2017   See Psychiatric Specialty Exam and Suicide Risk Assessment completed by Attending Physician prior to discharge.  Discharge destination:  Home  Is patient on multiple antipsychotic therapies at discharge:  No   Has Patient had three or more failed trials of antipsychotic monotherapy by history:  No  Recommended Plan for  Multiple Antipsychotic Therapies: NA  Allergies as of 10/22/2021       Reactions   Clonopin [clonazepam] Anaphylaxis   Swelling of the tongue and mouth.    Fish Allergy Anaphylaxis, Shortness Of Breath, Swelling   Flexeril [cyclobenzaprine Hcl] Shortness Of Breath   Ibuprofen Anaphylaxis, Hives, Other (See Comments)   Shellfish Allergy Anaphylaxis   Tylenol [acetaminophen] Anaphylaxis   Ace Inhibitors Cough   Peanut Allergen Powder-dnfp    Tramadol Nausea And Vomiting   Upset stomach   Trazodone And Nefazodone Hives        Medication List     STOP taking these medications    cloNIDine 0.1 MG tablet Commonly known as: CATAPRES   DULoxetine 30 MG capsule Commonly known as: CYMBALTA   Eliquis 5 MG Tabs tablet Generic drug: apixaban   fluticasone-salmeterol 250-50 MCG/ACT Aepb Commonly known as: Wixela Inhub   hydrocortisone 2.5 % rectal cream Commonly known as: Anusol-HC   hydrOXYzine 50 MG capsule Commonly known as: VISTARIL   Misc. Devices Misc   Nebulizer/Tubing/Mouthpiece Kit   omeprazole 20 MG capsule Commonly known as: PRILOSEC   ondansetron 4 MG disintegrating tablet Commonly known as: ZOFRAN-ODT   sucralfate 1 g tablet Commonly known as: CARAFATE       TAKE these medications      Indication  albuterol 108 (90 Base) MCG/ACT inhaler Commonly known as: Ventolin HFA Inhale 2 puffs into the lungs every 6 (six) hours as needed for wheezing or shortness of breath. What changed: Another medication with the same name was removed. Continue taking this medication,  and follow the directions you see here.  Indication: Chronic Obstructive Lung Disease, COPD   amLODipine 5 MG tablet Commonly known as: NORVASC Take 1 tablet (5 mg total) by mouth daily. Start taking on: October 23, 2021 What changed: additional instructions  Indication: High Blood Pressure Disorder   anagrelide 1 MG capsule Commonly known as: AGRYLIN Take 1 capsule (1 mg total) by mouth 2 (two) times daily.  Indication: h/o PE   benztropine 0.5 MG tablet Commonly known as: COGENTIN Take 1 tablet (0.5 mg total) by mouth 2 (two) times daily as needed for tremors (EPS/dystonia).  Indication: Extrapyramidal Reaction caused by Medications   citalopram 20 MG tablet Commonly known as: CELEXA Take 1 tablet (20 mg total) by mouth daily.  Indication: Major Depressive Disorder   cyanocobalamin 1000 MCG tablet Take 1 tablet (1,000 mcg total) by mouth daily. Start taking on: October 23, 2021 What changed:  medication strength how much to take  Indication: Inadequate Vitamin B12   furosemide 20 MG tablet Commonly known as: LASIX TAKE 1 TABLET BY MOUTH DAILY  Indication: Edema   lubiprostone 24 MCG capsule Commonly known as: Amitiza Take 1 capsule (24 mcg total) by mouth 2 (two) times daily with a meal.  Indication: Chronic Constipation of Unknown Cause   melatonin 3 MG Tabs tablet Take 1 tablet (3 mg total) by mouth at bedtime.  Indication: Trouble Sleeping   metroNIDAZOLE 500 MG tablet Commonly known as: FLAGYL Take 1 tablet (500 mg total) by mouth 2 (two) times daily. What changed: additional instructions  Indication: Infection caused by Trichomoniasis Protozoa, trichomonas   mometasone-formoterol 200-5 MCG/ACT Aero Commonly known as: DULERA Inhale 2 puffs into the lungs 2 (two) times daily.  Indication: Chronic Obstructive Lung Disease   pantoprazole 40 MG tablet Commonly known as: PROTONIX Take 1 tablet (40 mg total) by mouth daily. Start taking on: October 23, 2021   Indication:  Gastroesophageal Reflux Disease   potassium chloride SA 20 MEQ tablet Commonly known as: KLOR-CON M TAKE (1) TABLET BY MOUTH ONCE A DAY. What changed: See the new instructions.  Indication: take only when taking Lasix/Follow outpatient provider's instructions   pregabalin 150 MG capsule Commonly known as: LYRICA Take 1 capsule (150 mg total) by mouth 2 (two) times daily.  Indication: Neuropathic Pain   risperiDONE 1 MG tablet Commonly known as: RISPERDAL Take 1 tablet (1 mg total) by mouth at bedtime. What changed: You were already taking a medication with the same name, and this prescription was added. Make sure you understand how and when to take each.  Indication: psychosis   risperiDONE 0.5 MG tablet Commonly known as: RISPERDAL Take 1 tablet (0.5 mg total) by mouth daily. Start taking on: October 23, 2021 What changed: when to take this  Indication: psychosis   tiZANidine 4 MG tablet Commonly known as: Zanaflex Take 1 tablet (4 mg total) by mouth every 8 (eight) hours as needed for muscle spasms.  Indication: Musculoskeletal Pain   Vitamin D (Ergocalciferol) 1.25 MG (50000 UNIT) Caps capsule Commonly known as: DRISDOL Take 1 capsule (50,000 Units total) by mouth every 7 (seven) days. Start taking on: October 26, 2021  Indication: Vitamin D Deficiency        Follow-up Information     Services, [facility]. Go on 10/28/2021.   Why: You have a hospital follow up appointment for an assessment to obtain therapy and medication management services on 10/28/21 at 1:00 pm.    This appointment will be held in person. Contact information: [place] [address] [place] Follow up.   Contact information: [place]  Phone: [phone], [place] Follow up on 10/26/2021.   Specialty: Behavioral Health Why: You have an appointment on 10/26/21 at  1:00 pm.  This program meets in person M,W,F from 9 am to 12 pm and runs for 8-12 weeks.  Clients can also recieve individual and family therapy and MAT.  There is weekly drug testing.  The program is abstinence-based, and AA, NA, [facility], etc. attendance is encouraged.  For questions, please call [phone] Contact information: [place] [address] [phone]        Lindell Spar, MD. Go on 11/03/2021.   Specialty: Internal Medicine Why: You have an appointment with this provider on 11/03/21 at 10:00 am.  This appointment will be held in person. Contact information: [place] [address] [phone]                Follow-up recommendations:   The patient is able to verbalize their individual safety plan to this provider.   # It is recommended to the patient to continue psychiatric medications as prescribed, after discharge from the hospital.     # It is recommended to the patient to follow up with your outpatient psychiatric provider and PCP.   # It was discussed with the patient, the impact of alcohol, drugs, tobacco have been there overall psychiatric and medical wellbeing, and total abstinence from substance use was recommended the patient.ed.   # Prescriptions provided or sent directly to preferred pharmacy at discharge. Patient agreeable to plan. Given opportunity to ask questions. Appears to feel comfortable with discharge.    # In the event of worsening  symptoms, the patient is instructed to call the crisis hotline (988), 911 and or go to the nearest ED for appropriate evaluation and treatment of symptoms. To follow-up with primary care provider for other medical issues, concerns and or health care needs   # Patient was discharged home with a plan to follow up as noted above.   Signed: Nicholes Rough, NP 10/22/2021, 10:28 AM

## 2021-10-22 NOTE — BHH Suicide Risk Assessment (Cosign Needed)
Suicide Risk Assessment  Discharge Assessment    [facility] Discharge Suicide Risk Assessment   Principal Problem: Schizoaffective disorder, bipolar type [facility]) Discharge Diagnoses: Principal Problem:   Schizoaffective disorder, bipolar type ([place]) Active Problems:   Hypertension   OA (osteoarthritis) of knee   Insomnia   GERD (gastroesophageal reflux disease)   History of DVT of lower extremity   History of pulmonary embolism   GAD (generalized anxiety disorder)   Polysubstance abuse ([place])   Trichomonas infection  Reason For Admission:  PATRICE MATTHEW is a 51 y.o. female with past history significant for bipolar d/o, GAD, MDD, polysubstance use, hypertension, asthma, COPD, T2DM, PE on Eliquis, GERD as well as a reported history of schizophrenia who presented to the [facility] hospital due to worsening depressive symptoms and SI, with a plan to use a knife to cut her neck. Pt was transferred to this [facility] voluntarily for treatment and stabilization of her mood.                                             HOSPITAL COURSE During the patient's hospitalization, patient had extensive initial psychiatric evaluation, and follow-up psychiatric evaluations every day.  Psychiatric diagnoses provided upon initial assessment were as follows:  Principal Problem:   Schizoaffective disorder, bipolar type ([place]) Active Problems:   Hypertension   OA (osteoarthritis) of knee   Insomnia   GERD (gastroesophageal reflux disease)   History of DVT of lower extremity   History of pulmonary embolism   GAD (generalized anxiety disorder)   Cocaine use disorder (HCC)  Patient's psychiatric medications were adjusted on admission as follows: -Continue Norvasc 5 mg daily for htn -Continue Citalopram 20 mg daily for depression -Continue Eliquis 5 mg BID for blood clots -Continue Amitiza 24 mcg BID for IBS -Continue Lyrica 150 mg BID for nerve pain -Continue Risperdal 0.5 mg BID for psychosis -Continue Zanaflex  4 mg Q8Hs PRN for muscle pain -Continue Melatonin 3 mg nightly for insomnia -Start Protonix 40 mg daily for GERD -Continue albuterol PRN for wheezinf/SOB -Continue Hydroxyzine 25 mg every 6 hours PRN -Start Dulera BID for COPD -Held off Clonidine since Bps were mostly WNL or low  During the hospitalization, other adjustments were made to the patient's psychiatric medication regimen as follows:   -Continue Norvasc 5 mg daily for htn (Pt's outpatient provider contacted by attending Psychiatrist due to LE edema. Recommendations were given for pt to take Lasix 20 mg PRN for edema. Pt was given Lasix 20 mg x 2 doses while at [facility], and has been educated to take Lasix 20 mg PRN along with Potassium 20 MEQ only when she takes Lasix. Pt is not being discharged with Potassium or Lasix as she states that she still has these meds at home) -Continue Citalopram 20 mg daily for depression -Stopped Eliquis 5 mg BID based on review of records and attending discussion with her PCP has completed treatment -Continue Amitiza 24 mcg BID for IBS -Continue Lyrica 150 mg BID for nerve pain (not prescribed, controlled pt has med at home) -Continue Risperdal 0.5 mg  in the mornings and continue nightly dose of 1 mg nightly for psychosis -Continue Zanaflex 4 mg Q8Hs PRN for muscle pain (not prescribed, states has med at home) -Continue Melatonin 3 mg nightly for insomnia -Continue Protonix 40 mg daily for GERD -Continue albuterol PRN for wheezing/SOB -  Continue Hydroxyzine 25 mg every 6 hours PRN -Continue Dulera BID for COPD -Continue Vit D 50,000 weekly for Vit D deficiency -Continue B12 1015mg daily for Vit B12 deficiency -Continue Colace daily for constipation (OTC) -Continue Flagyl '500mg'$  bid for 7 days for trichomonas -Continue Agrylin '1mg'$  bid - home med for elevated platelets - confirmed based on hematology notes and attending discussion with PCP  Patient's care was discussed during the interdisciplinary team  meeting every day during the hospitalization. The patient denies having side effects to prescribed psychiatric medication. The patient was evaluated each day by a clinical provider to ascertain response to treatment. Improvement was noted by the patient's report of decreasing symptoms, improved sleep and appetite, affect, medication tolerance, behavior, and participation in unit programming.  Patient was asked each day to complete a self inventory noting mood, mental status, pain, new symptoms, anxiety and concerns.    Symptoms were reported as significantly decreased or resolved completely by discharge. On day of discharge, the patient reports that their mood is stable. The patient denied having suicidal thoughts for more than 48 hours prior to discharge.  Patient denies having homicidal thoughts.  Patient denies having auditory hallucinations.  Patient denies any visual hallucinations or other symptoms of psychosis. The patient was motivated to continue taking medication with a goal of continued improvement in mental health.   The patient reports their target psychiatric symptoms of depression, anxiety and insomnia responded well to the psychiatric medications, and the patient reports overall benefit from this psychiatric hospitalization. Supportive psychotherapy was provided to the patient. The patient also participated in regular group therapy while hospitalized. Coping skills, problem solving as well as relaxation therapies were also part of the unit programming.  Labs were reviewed with the patient, and abnormal results were discussed with the patient. Vitamin D and Vitamin B12 are low during this admission, and pt has been educated on the need to continue taking these supplements, and also to follow these up with her Primary care provider.  Total Time spent with patient: 30 minutes  Musculoskeletal: Strength & Muscle Tone: within normal limits Gait & Station: normal Patient leans:  N/A  Psychiatric Specialty Exam  Presentation  General Appearance: Appropriate for Environment; Fairly Groomed  Eye Contact:Fair  Speech:Clear and Coherent  Speech Volume:Normal  Handedness:Right  Mood and Affect  Mood:Euthymic  Duration of Depression Symptoms: Greater than two weeks  Affect:Congruent  Thought Process  Thought Processes:Coherent  Descriptions of Associations:Intact  Orientation:Full (Time, Place and Person)  Thought Content:Logical  History of Schizophrenia/Schizoaffective disorder:Yes  Duration of Psychotic Symptoms:N/A  Hallucinations:Hallucinations: None Description of Auditory Hallucinations: none at this time Description of Visual Hallucinations: none at this time  Ideas of Reference:None  Suicidal Thoughts:Suicidal Thoughts: No  Homicidal Thoughts:Homicidal Thoughts: No  Sensorium  Memory:Immediate Good  Judgment:Good  Insight:Good  Executive Functions  Concentration:Good  Attention Span:Good  [place] Knowledge:Good  Language:Good  Psychomotor Activity  Psychomotor Activity:Psychomotor Activity: Normal  Assets  Assets:Communication Skills  Sleep  Sleep:Sleep: Good  Physical Exam: Physical Exam Constitutional:      Appearance: Normal appearance.  HENT:     Head: Normocephalic.     Nose: Nose normal. No congestion or rhinorrhea.  Eyes:     Pupils: Pupils are equal, round, and reactive to light.  Musculoskeletal:     Cervical back: Normal range of motion.  Neurological:     Mental Status: She is alert and oriented to person, place, and time.     Sensory: No  sensory deficit.     Coordination: Coordination normal.  Psychiatric:        Thought Content: Thought content normal.    Review of Systems  Constitutional: Negative.  Negative for fever.  HENT: Negative.  Negative for sore throat.   Eyes: Negative.   Respiratory: Negative.    Cardiovascular: Negative.   Gastrointestinal: Negative.    Genitourinary: Negative.   Musculoskeletal: Negative.   Skin: Negative.   Neurological: Negative.   Psychiatric/Behavioral:  Positive for depression (Patient denies SI/HI/AVH and verbally contracts for safety outside of this [facility]) and substance abuse (Educated on the need to abstain from substances of abuse). Negative for hallucinations, memory loss and suicidal ideas. The patient is not nervous/anxious and does not have insomnia (resolving with medications).    Blood pressure 124/86, pulse 95, temperature 98.5 F (36.9 C), temperature source Oral, resp. rate 20, height 4' 11.5" (1.511 m), weight 83.2 kg, last menstrual period 06/07/2020, SpO2 100 %. Body mass index is 36.42 kg/m.  Mental Status Per Nursing Assessment::   On Admission:  Suicidal ideation indicated by patient  Demographic Factors:  Low socioeconomic status  Loss Factors: Decline in physical health  Historical Factors: Impulsivity  Risk Reduction Factors:   Living with another person, especially a relative  Continued Clinical Symptoms:  Patient reports that her depressive symptoms have resolved. She currently denies SI/HI/AVH, denies paranoia and verbally contracts for safety outside of this [facility].   Cognitive Features That Contribute To Risk:  None    Suicide Risk:  Minimal: No identifiable suicidal ideation.  Patients presenting with no risk factors but with morbid ruminations; may be classified as minimal risk based on the severity of the depressive symptoms   Follow-up Information     Services, [facility]. Go on 10/28/2021.   Why: You have a hospital follow up appointment for an assessment to obtain therapy and medication management services on 10/28/21 at 1:00 pm.    This appointment will be held in person. Contact information: [place] [address] [place] Follow up.   Contact information: [place]  Phone: [phone],  [place] Follow up on 10/26/2021.   Specialty: Behavioral Health Why: You have an appointment on 10/26/21 at 1:00 pm.  This program meets in person M,W,F from 9 am to 12 pm and runs for 8-12 weeks.  Clients can also recieve individual and family therapy and MAT.  There is weekly drug testing.  The program is abstinence-based, and AA, NA, [facility], etc. attendance is encouraged.  For questions, please call [phone] Contact information: [place] [address] [phone]        Lindell Spar, MD. Go on 11/03/2021.   Specialty: Internal Medicine Why: You have an appointment with this provider on 11/03/21 at 10:00 am.  This appointment will be held in person. Contact information: [address] [place] [address] [phone]                Plan Of Care/Follow-up recommendations:   The patient is able to verbalize their individual safety plan to this provider.  # It is recommended to the patient to continue psychiatric medications as prescribed, after discharge from the hospital.    # It is recommended to the patient to follow up with your outpatient psychiatric provider and PCP.  #  It was discussed with the patient, the impact of alcohol, drugs, tobacco have been there overall psychiatric and medical wellbeing, and total abstinence from substance use was recommended the patient.ed.  # Prescriptions provided or sent directly to preferred pharmacy at discharge. Patient agreeable to plan. Given opportunity to ask questions. Appears to feel comfortable with discharge.    # In the event of worsening symptoms, the patient is instructed to call the crisis hotline (988), 911 and or go to the nearest ED for appropriate evaluation and treatment of symptoms. To follow-up with primary care provider for other medical issues, concerns and or health care needs  #  Patient was discharged home with a plan to follow up as noted above.  Nicholes Rough, NP 10/22/2021, 10:18 AM

## 2021-10-22 NOTE — BHH Group Notes (Signed)
Goals Group 78/2023   Group Focus: affirmation, clarity of thought, and goals/reality orientation Treatment Modality:  Psychoeducation Interventions utilized were assignment, group exercise, and support Purpose: To be able to understand and verbalize the reason for their admission to the hospital. To understand that the medication helps with their chemical imbalance but they also need to work on their choices in life. To be challenged to develop a list of 30 positives about themselves. Also introduce the concept that "feelings" are not reality.  Participation Level:  Active  Participation Quality:  Appropriate  Affect:  Appropriate  Cognitive:  Appropriate  Insight:  Improving  Engagement in Group:  Engaged  Additional Comments:  [employer]ates energy at a 4/10. Participated in the discussion.  Kathryn [name]

## 2021-10-22 NOTE — Progress Notes (Signed)
Pt discharged to lobby. Pt was stable and appreciative at that time. All papers and electronic prescriptions were given and valuables returned. Verbal understanding expressed. Denies SI/HI and A/VH. Pt given opportunity to express concerns and ask questions.

## 2021-10-22 NOTE — Progress Notes (Signed)
D. Pt was sitting in bed coloring upon initial approach- was pleasant, friendly during interaction. Pt reported that she was looking forward to discharging today. Pt currently denies SI/HI and AVH.  A. Labs and vitals monitored. Pt given and educated on medications. Pt supported emotionally and encouraged to express concerns and ask questions.   R. Pt remains safe with 15 minute checks. Will continue POC.

## 2021-10-22 NOTE — Group Note (Signed)
LCSW Group Therapy Note  10/22/2021      Topic:  Anger Healthy and Unhealthy Coping Skills  Participation Level:  None  Description of Group:   In this group, patients identified their own common triggers and typical reactions then analyzed how these reactions are possibly beneficial and possibly unhelpful.  Focus was placed on examining whether typical coping skills are healthy or unhealthy.  Therapeutic Goals: Patients will share situations that commonly incite their anger and how they typically respond Patients will identify how their coping skills work for them and/or against them Patients will explore possible alternative coping skills Patients will learn that anger itself is normal and that healthier reactions can assist with resolving conflict rather than worsening situations  Summary of Patient Progress:  The patient was present at the beginning of group, but left before her turn to share.  Therapeutic Modalities:   Cognitive Behavioral Therapy Processing  Maretta Los

## 2021-10-22 NOTE — Progress Notes (Signed)
  [facility] Adult Case Management Discharge Plan :  Will you be returning to the same living situation after discharge:  Yes,  is homeless, was given resources At discharge, do you have transportation home?: Yes,  friend Do you have the ability to pay for your medications: Yes,  insurance  Release of information consent forms completed and emailed to Medical Records, then turned in to Medical Records by CSW.   Patient to Follow up at:  Follow-up Information     Services, [facility]. Go on 10/28/2021.   Why: You have a hospital follow up appointment for an assessment to obtain therapy and medication management services on 10/28/21 at 1:00 pm.    This appointment will be held in person. Contact information: [place] [address] [place] Follow up.   Contact information: [place]  Phone: [phone], [place] Follow up on 10/26/2021.   Specialty: Behavioral Health Why: You have an appointment on 10/26/21 at 1:00 pm.  This program meets in person M,W,F from 9 am to 12 pm and runs for 8-12 weeks.  Clients can also recieve individual and family therapy and MAT.  There is weekly drug testing.  The program is abstinence-based, and AA, NA, [facility] Recovery, etc. attendance is encouraged.  For questions, please call [phone] Contact information: [place] [address] [phone]        Lindell Spar, MD. Go on 11/03/2021.   Specialty: Internal Medicine Why: You have an appointment with this provider on 11/03/21 at 10:00 am.  This appointment will be held in person. Contact information: [place] [address] [phone]                 Next level of care provider has access to [place] and Suicide Prevention discussed: No.  Declined     Has patient been  referred to the Quitline?: N/A patient is not a smoker  Patient has been referred for addiction treatment: Yes  Maretta Los, LCSW 10/22/2021, 10:07 AM

## 2021-10-23 ENCOUNTER — Other Ambulatory Visit: Payer: Self-pay | Admitting: Pulmonary Disease

## 2021-10-24 ENCOUNTER — Other Ambulatory Visit (INDEPENDENT_AMBULATORY_CARE_PROVIDER_SITE_OTHER): Payer: Self-pay

## 2021-10-24 ENCOUNTER — Telehealth: Payer: Self-pay | Admitting: *Deleted

## 2021-10-24 ENCOUNTER — Telehealth: Payer: Self-pay | Admitting: Internal Medicine

## 2021-10-24 ENCOUNTER — Encounter (INDEPENDENT_AMBULATORY_CARE_PROVIDER_SITE_OTHER): Payer: Self-pay

## 2021-10-24 DIAGNOSIS — R69 Illness, unspecified: Secondary | ICD-10-CM | POA: Diagnosis not present

## 2021-10-24 DIAGNOSIS — Z1211 Encounter for screening for malignant neoplasm of colon: Secondary | ICD-10-CM

## 2021-10-24 DIAGNOSIS — F1420 Cocaine dependence, uncomplicated: Secondary | ICD-10-CM | POA: Diagnosis not present

## 2021-10-24 NOTE — Telephone Encounter (Signed)
Transition Care Management Unsuccessful Follow-up Telephone Call  Date of discharge and from where:  10-22-21   Attempts:  1st Attempt  Reason for unsuccessful TCM follow-up call:  Left voice message

## 2021-10-24 NOTE — Telephone Encounter (Signed)
Pt returning call

## 2021-10-24 NOTE — Telephone Encounter (Signed)
TOC call made 

## 2021-10-24 NOTE — Telephone Encounter (Signed)
Transition Care Management Follow-up Telephone Call Date of discharge and from where: 10-22-21 [facility] How have you been since you were released from the hospital? Doing good  Any questions or concerns? Yes  Items Reviewed: Did the pt receive and understand the discharge instructions provided? Yes  Medications obtained and verified? Yes  Other? No  Any new allergies since your discharge? No  Dietary orders reviewed? Yes Do you have support at home? Yes   Home Care and Equipment/Supplies: Were home health services ordered? not applicable If so, what is the name of the agency? NA  Has the agency set up a time to come to the patient's home? not applicable Were any new equipment or medical supplies ordered?  No What is the name of the medical supply agency? NA Were you able to get the supplies/equipment? not applicable Do you have any questions related to the use of the equipment or supplies? No  Functional Questionnaire: (I = Independent and D = Dependent) ADLs: i  Bathing/Dressing- i  Meal Prep- i  Eating- i  Maintaining continence- i  Transferring/Ambulation- i  Managing Meds- i  Follow up appointments reviewed:  PCP Hospital f/u appt confirmed? Yes  Scheduled to see Posey Pronto on 11-03-21  [facility] f/u appt confirmed? No   Are transportation arrangements needed? No  If their condition worsens, is the pt aware to call PCP or go to the Emergency Dept.? Yes Was the patient provided with contact information for the PCP's office or ED? Yes Was to pt encouraged to call back with questions or concerns? Yes

## 2021-10-25 DIAGNOSIS — Z87891 Personal history of nicotine dependence: Secondary | ICD-10-CM | POA: Diagnosis not present

## 2021-10-25 DIAGNOSIS — Z886 Allergy status to analgesic agent status: Secondary | ICD-10-CM | POA: Diagnosis not present

## 2021-10-25 DIAGNOSIS — Z91013 Allergy to seafood: Secondary | ICD-10-CM | POA: Diagnosis not present

## 2021-10-25 DIAGNOSIS — I10 Essential (primary) hypertension: Secondary | ICD-10-CM | POA: Diagnosis not present

## 2021-10-25 DIAGNOSIS — Z888 Allergy status to other drugs, medicaments and biological substances status: Secondary | ICD-10-CM | POA: Diagnosis not present

## 2021-10-25 DIAGNOSIS — M542 Cervicalgia: Secondary | ICD-10-CM | POA: Diagnosis not present

## 2021-10-25 DIAGNOSIS — Z91010 Allergy to peanuts: Secondary | ICD-10-CM | POA: Diagnosis not present

## 2021-10-26 ENCOUNTER — Ambulatory Visit (INDEPENDENT_AMBULATORY_CARE_PROVIDER_SITE_OTHER): Payer: Medicaid Other | Admitting: Licensed Clinical Social Worker

## 2021-10-26 ENCOUNTER — Emergency Department (HOSPITAL_COMMUNITY): Admission: EM | Admit: 2021-10-26 | Discharge: 2021-10-26 | Discharge status: Against Medical Advice | Payer: 59

## 2021-10-26 DIAGNOSIS — F332 Major depressive disorder, recurrent severe without psychotic features: Secondary | ICD-10-CM

## 2021-10-26 DIAGNOSIS — F1494 Cocaine use, unspecified with cocaine-induced mood disorder: Secondary | ICD-10-CM

## 2021-10-26 NOTE — Progress Notes (Signed)
Kathryn [name] presents today having recently been discharged from [facility]. She was admitted due to having suicidal ideation with plan and intent to kill herself precipitated by using crack cocaine and having been homeless since 2019. She denies any current suicidal or homicidal ideation. She wanted to be discharged to a substance-use, residential treatment center; however, none would take her as she must ambulate with a walker. She is on disability and has an [employer] and Medicaid.   She says that she sometimes spends the night at her boyfriend's, "Kathryn [name], and sometimes sleeps in the woods. At times, she will spend money to get a hotel room. She is unable to get a place in [facility] where she is originally from due to having a felony conviction from 1996 for intent to sell and manufacture cocaine.   She started using cocaine in her teens saying that her longest sobriety was for six months from January 4th of this year until June 30th of this year. She says that she relapsed as her best friend whom she has known for years came back in town with alcohol and around three-thousand dollars-worth of cocaine to use with Kathryn [name]. Kathryn [name] says that she can stop using for a week or two but then will relapse typically using crack cocaine about three days out of the week as she does not have enough money for more. She denies any tobacco use or any other drug use.   She has had two inpatient admissions over the past year at [facility] and a previous admission to [place] [place]. She has been seen at [facility] in [facility]. Her psychiatrist is "Kathryn [name]", and her therapist is Kathryn [name]. She has a Education officer, museum, Designer, jewellery, who has been working on finding housing for her; however, Kathryn [name] complains that she is not doing much. She says that she also used to go to [facility] in "2018 or 2020" and, based on the description of the program, attended a SA IOP but stopped attending a week before  "graduating." Her reason for being seen at [facility] at the time was "depression," suicidal thinking, and "addiction." She does not attend AA or NA and knows basically nothing about either program.   She has three children saying that her son smokes pot and one of her daughters does crack and heroin with only her youngest daughter not using.   The therapist educates Kathryn [name] on [facility] and provides her with the contact number for two [employer] in [place] each with one vacancy. He also shows her how to find on-line NA meetings as she notes that gas money is a problem.  She signs a ROI for [facility] to speak with Kathryn [name]. She will call the [facility] and attend her therapy appointment this Friday at 1 p.m. with "Kathryn [name]" showing her a copy of the ROI, the information on [employer], and the flyer describing [facility] SA IOP. She is to have her therapist contact this therapist directly with questions or concerns and Kathryn [name] will call this therapist on Monday, 10/31/21, to let him know if she is going to attend the SA IOP at [facility] or if she has an alternate plan in [facility].   Adam Phenix, [place], LCSW, [facility], [place] 10/26/2021

## 2021-10-27 ENCOUNTER — Emergency Department (HOSPITAL_COMMUNITY): Admission: EM | Admit: 2021-10-27 | Discharge: 2021-10-27 | Discharge status: Against Medical Advice | Payer: 59

## 2021-11-03 ENCOUNTER — Telehealth: Payer: Self-pay

## 2021-11-03 ENCOUNTER — Telehealth (INDEPENDENT_AMBULATORY_CARE_PROVIDER_SITE_OTHER): Payer: Medicare HMO | Admitting: Internal Medicine

## 2021-11-03 ENCOUNTER — Ambulatory Visit (INDEPENDENT_AMBULATORY_CARE_PROVIDER_SITE_OTHER): Payer: 59 | Admitting: Gastroenterology

## 2021-11-03 ENCOUNTER — Encounter: Payer: Self-pay | Admitting: Internal Medicine

## 2021-11-03 DIAGNOSIS — F1494 Cocaine use, unspecified with cocaine-induced mood disorder: Secondary | ICD-10-CM

## 2021-11-03 DIAGNOSIS — R600 Localized edema: Secondary | ICD-10-CM

## 2021-11-03 DIAGNOSIS — I10 Essential (primary) hypertension: Secondary | ICD-10-CM | POA: Diagnosis not present

## 2021-11-03 DIAGNOSIS — Z09 Encounter for follow-up examination after completed treatment for conditions other than malignant neoplasm: Secondary | ICD-10-CM | POA: Insufficient documentation

## 2021-11-03 DIAGNOSIS — F250 Schizoaffective disorder, bipolar type: Secondary | ICD-10-CM

## 2021-11-03 DIAGNOSIS — Z01810 Encounter for preprocedural cardiovascular examination: Secondary | ICD-10-CM

## 2021-11-03 DIAGNOSIS — R69 Illness, unspecified: Secondary | ICD-10-CM | POA: Diagnosis not present

## 2021-11-03 NOTE — Assessment & Plan Note (Signed)
Followed by [facility]hab program Followed by [name] recovery

## 2021-11-03 NOTE — Progress Notes (Signed)
Virtual Visit via Video Note   Because of Kathryn [name]'s co-morbid illnesses, she is at least at moderate risk for complications without adequate follow up.  This format is felt to be most appropriate for this patient at this time.  All issues noted in this document were discussed and addressed.  A limited physical exam was performed with this format.     Evaluation Performed:  Follow-up visit  Date:  11/03/2021   ID:  Kathryn [name], DOB 11/01/1970, MRN [id number]  Patient Location: Home Provider Location: Office/Clinic  Participants: Patient Location of Patient: Home Location of Provider: Telehealth Consent was obtain for visit to be over via telehealth. I verified that I am speaking with the correct person using two identifiers.  PCP:  Kathryn Spar, MD   Chief Complaint: Hospital discharge follow-up  History of Present Illness:    Kathryn [name] is a 51 y.o. female with PMH of hypertension, asthma, GERD, insomnia, mild sleep apnea, schizoaffective disorder , chronic low back pain and knee pain and obesity who has a video visit for hospital discharge follow-up.  She was admitted with suicidal ideations on 07/03, and was later transferred to inpatient psychiatric unit.  She responded well to treatment with antipsychotic.  She was discharged on risperidone, and Agrylin, Cogentin and Celexa.  She was referred to intense [facility] therapy for cocaine and alcohol abuse.  She was also discharged with metronidazole for trichomonas infection.  She denies any fever, chills, nausea or vomiting currently.  I had discussion with her inpatient psychiatrist on the day of discharge for medication reconciliation.  She complains of chronic leg swelling, for which she takes Lasix.  She requests referral to [facility] cardiology for preop cardiac clearance requested by her orthopedic surgeon.    The patient does not have symptoms concerning for COVID-19 infection (fever, chills, cough, or new  shortness of breath).   Past Medical, Surgical, Social History, Allergies, and Medications have been Reviewed.  Past Medical History:  Diagnosis Date   Anxiety    Arthritis    Asthma    Bipolar disorder (HCC)    Chronic back pain    COPD (chronic obstructive pulmonary disease) (HCC)    Chronic bronchitis   Depression    Dyspnea    GERD (gastroesophageal reflux disease)    Hypertension    Migraine    Neuropathy    Osteoarthritis of left knee, 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   Single subsegmental pulmonary embolism without acute cor pulmonale ([place]) 06/20/2021   Sleep apnea 04/2020   GETTING A cpap   Suicidal ideation 12/24/2018   Past Surgical History:  Procedure Laterality Date   BACK SURGERY     DILATATION AND CURETTAGE/HYSTEROSCOPY WITH MINERVA N/A 06/09/2020   Procedure: DILATATION AND CURETTAGE/HYSTEROSCOPY WITH MINERVA;  Surgeon: Florian Buff, MD;  Location: [facility];  Service: Gynecology;  Laterality: N/A;   ESOPHAGOGASTRODUODENOSCOPY (EGD) WITH PROPOFOL N/A 12/25/2018   Procedure: ESOPHAGOGASTRODUODENOSCOPY (EGD) WITH PROPOFOL;  Surgeon: Rogene Houston, MD;  Location: [facility];  Service: Endoscopy;  Laterality: N/A;   FLEXIBLE SIGMOIDOSCOPY  10/14/2021   Procedure: FLEXIBLE SIGMOIDOSCOPY;  Surgeon: Harvel Quale, MD;  Location: [facility];  Service: Gastroenterology;;   PATELLA-FEMORAL ARTHROPLASTY Left 10/08/2018   Procedure: PATELLA-FEMORAL ARTHROPLASTY;  Surgeon: Marchia Bond, MD;  Location: [facility];  Service: Orthopedics;  Laterality: Left;   TUBAL LIGATION       Current Meds  Medication Sig   amLODipine (NORVASC) 5 MG tablet  Take 1 tablet (5 mg total) by mouth daily.   anagrelide (AGRYLIN) 1 MG capsule Take 1 capsule (1 mg total) by mouth 2 (two) times daily.   benztropine (COGENTIN) 0.5 MG tablet Take 1 tablet (0.5 mg total) by mouth 2 (two) times daily as needed for tremors (EPS/dystonia).   citalopram (CELEXA) 20 MG tablet Take 1 tablet (20 mg  total) by mouth daily.   furosemide (LASIX) 20 MG tablet TAKE 1 TABLET BY MOUTH DAILY   lubiprostone (AMITIZA) 24 MCG capsule Take 1 capsule (24 mcg total) by mouth 2 (two) times daily with a meal.   melatonin 3 MG TABS tablet Take 1 tablet (3 mg total) by mouth at bedtime.   mometasone-formoterol (DULERA) 200-5 MCG/ACT AERO Inhale 2 puffs into the lungs 2 (two) times daily.   pantoprazole (PROTONIX) 40 MG tablet Take 1 tablet (40 mg total) by mouth daily.   potassium chloride SA (KLOR-CON M) 20 MEQ tablet TAKE (1) TABLET BY MOUTH ONCE A DAY. (Patient taking differently: Take 20 mEq by mouth daily.)   pregabalin (LYRICA) 150 MG capsule Take 1 capsule (150 mg total) by mouth 2 (two) times daily.   risperiDONE (RISPERDAL) 0.5 MG tablet Take 1 tablet (0.5 mg total) by mouth daily.   risperiDONE (RISPERDAL) 1 MG tablet Take 1 tablet (1 mg total) by mouth at bedtime.   tiZANidine (ZANAFLEX) 4 MG tablet Take 1 tablet (4 mg total) by mouth every 8 (eight) hours as needed for muscle spasms.   VENTOLIN HFA 108 (90 Base) MCG/ACT inhaler INHALE TWO (2) PUFFS BY MOUTH EVERY 6 HOURS AS NEEDED FOR WHEEZING OR SHORTNESS OF BREATH   vitamin B-12 1000 MCG tablet Take 1 tablet (1,000 mcg total) by mouth daily.   Vitamin D, Ergocalciferol, (DRISDOL) 1.25 MG (50000 UNIT) CAPS capsule Take 1 capsule (50,000 Units total) by mouth every 7 (seven) days.   [DISCONTINUED] metroNIDAZOLE (FLAGYL) 500 MG tablet Take 1 tablet (500 mg total) by mouth 2 (two) times daily.     Allergies:   Clonopin [clonazepam], Fish allergy, Flexeril [cyclobenzaprine hcl], Ibuprofen, Shellfish allergy, Tylenol [acetaminophen], Ace inhibitors, Peanut allergen powder-dnfp, Tramadol, and Trazodone and nefazodone   ROS:   Please see the history of present illness.     All other systems reviewed and are negative.   Labs/Other Tests and Data Reviewed:    Recent Labs: 05/23/2021: B Natriuretic Peptide 63.0 07/02/2021: Magnesium 1.9 10/16/2021:  ALT 20; Hemoglobin 12.5; Platelets 624 10/19/2021: TSH 0.952 10/21/2021: BUN 11; Creatinine, Ser 0.75; Potassium 3.9; Sodium 139   Recent Lipid Panel Lab Results  Component Value Date/Time   CHOL 148 10/19/2021 06:43 AM   TRIG 41 10/19/2021 06:43 AM   HDL 56 10/19/2021 06:43 AM   CHOLHDL 2.6 10/19/2021 06:43 AM   LDLCALC 84 10/19/2021 06:43 AM   LDLCALC 72 04/20/2017 09:55 AM    Wt Readings from Last 3 Encounters:  10/17/21 183 lb 6.4 oz (83.2 kg)  10/16/21 186 lb (84.4 kg)  10/14/21 186 lb (84.4 kg)     ASSESSMENT & PLAN:    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[place]) Followed by intense [facility] therapy/rehab program Followed by [facility] discharge follow-up Hospital chart reviewed, including discharge summary Medications reconciled and reviewed with the patient in detail Has had outpatient follow-up with psychiatry and [facility] therapy  Schizoaffective disorder, bipolar type ([place]) Recent admission for suicidal ideation and polysubstance abuse On Celexa, anagrelide, benztropine and risperidone Followed by [facility] recovery  Bilateral leg  edema On Lasix 20 mg daily with potassium supplement Likely due to chronic venous insufficiency and likely HFpEF Referred to cardiology  Hypertension BP Readings from Last 1 Encounters:  10/22/21 124/86   Well-controlled overall with Amlodipine and Lasix Would prefer to switch to ARB, will discuss after her surgery Counseled for compliance with the medications Advised DASH diet and moderate exercise/walking, at least 150 mins/week   Time:   Today, I have spent 27 minutes reviewing the chart, including problem list, medications, and with the patient with telehealth technology discussing the above problems.   Medication Adjustments/Labs and Tests Ordered: Current medicines are reviewed at length with the patient today.  Concerns regarding medicines are outlined above.   Tests Ordered: Orders Placed This  Encounter  Procedures   Ambulatory referral to Cardiology    Medication Changes: No orders of the defined types were placed in this encounter.    Note: This dictation was prepared with Dragon dictation along with smaller phrase technology. Similar sounding words can be transcribed inadequately or may not be corrected upon review. Any transcriptional errors that result from this process are unintentional.      Disposition:  Follow up  Signed, Kathryn Spar, MD  11/03/2021 10:39 AM     [place] [facility]

## 2021-11-03 NOTE — Patient Instructions (Signed)
Please continue taking medications as prescribed.  Please follow up with Psychiatry as scheduled.

## 2021-11-03 NOTE — Assessment & Plan Note (Signed)
BP Readings from Last 1 Encounters:  10/22/21 124/86   Well-controlled overall with Amlodipine and Lasix Would prefer to switch to ARB, will discuss after her surgery Counseled for compliance with the medications Advised DASH diet and moderate exercise/walking, at least 150 mins/week

## 2021-11-03 NOTE — Telephone Encounter (Signed)
SS Admin management medical benefit forms  Copied Noted Sleeved

## 2021-11-03 NOTE — Assessment & Plan Note (Signed)
On Lasix 20 mg daily with potassium supplement Likely due to chronic venous insufficiency and likely HFpEF Referred to cardiology

## 2021-11-03 NOTE — Assessment & Plan Note (Signed)
Hospital chart reviewed, including discharge summary Medications reconciled and reviewed with the patient in detail Has had outpatient follow-up with psychiatry and [facility] therapy

## 2021-11-03 NOTE — Assessment & Plan Note (Signed)
Recent admission for suicidal ideation and polysubstance abuse On Celexa, anagrelide, benztropine and risperidone Followed by [facility] recovery

## 2021-11-06 ENCOUNTER — Other Ambulatory Visit: Payer: Self-pay | Admitting: Internal Medicine

## 2021-11-06 DIAGNOSIS — M48062 Spinal stenosis, lumbar region with neurogenic claudication: Secondary | ICD-10-CM

## 2021-11-07 ENCOUNTER — Telehealth: Payer: Self-pay | Admitting: Internal Medicine

## 2021-11-07 DIAGNOSIS — Z0279 Encounter for issue of other medical certificate: Secondary | ICD-10-CM

## 2021-11-07 NOTE — Telephone Encounter (Signed)
Pt called stating she dropped off Disability pwk last Monday is wanting an update so that she can get these mailed back in?

## 2021-11-07 NOTE — Telephone Encounter (Signed)
This process is handled through the front office and tracked please see if you can give her an update

## 2021-11-08 NOTE — Telephone Encounter (Signed)
Form not ready, not received from Dr patel yet.

## 2021-11-09 ENCOUNTER — Emergency Department (HOSPITAL_COMMUNITY): Payer: Medicare HMO

## 2021-11-09 ENCOUNTER — Emergency Department (HOSPITAL_COMMUNITY)
Admission: EM | Admit: 2021-11-09 | Discharge: 2021-11-09 | Disposition: A | Discharge status: Home / Self Care | Payer: Medicare HMO | Attending: Emergency Medicine | Admitting: Emergency Medicine

## 2021-11-09 ENCOUNTER — Other Ambulatory Visit: Payer: Self-pay

## 2021-11-09 DIAGNOSIS — M25552 Pain in left hip: Secondary | ICD-10-CM | POA: Diagnosis not present

## 2021-11-09 DIAGNOSIS — Z7901 Long term (current) use of anticoagulants: Secondary | ICD-10-CM | POA: Diagnosis not present

## 2021-11-09 DIAGNOSIS — M25551 Pain in right hip: Secondary | ICD-10-CM | POA: Diagnosis not present

## 2021-11-09 DIAGNOSIS — Z79899 Other long term (current) drug therapy: Secondary | ICD-10-CM | POA: Diagnosis not present

## 2021-11-09 DIAGNOSIS — M1711 Unilateral primary osteoarthritis, right knee: Secondary | ICD-10-CM | POA: Insufficient documentation

## 2021-11-09 DIAGNOSIS — M25561 Pain in right knee: Secondary | ICD-10-CM

## 2021-11-09 DIAGNOSIS — Z91010 Allergy to peanuts: Secondary | ICD-10-CM | POA: Insufficient documentation

## 2021-11-09 MED ORDER — HYDROMORPHONE HCL 1 MG/ML IJ SOLN
1.0000 mg | Freq: Once | INTRAMUSCULAR | Status: AC
  Administered 2021-11-09: 1 mg via INTRAMUSCULAR
  Filled 2021-11-09: qty 1

## 2021-11-09 MED ORDER — OXYCODONE HCL 5 MG PO TABS: 5.0000 mg | ORAL_TABLET | Freq: Four times a day (QID) | ORAL | 0 refills | Status: DC | PRN

## 2021-11-09 MED ORDER — ONDANSETRON 4 MG PO TBDP
4.0000 mg | ORAL_TABLET | Freq: Once | ORAL | Status: AC
  Administered 2021-11-09: 4 mg via ORAL
  Filled 2021-11-09: qty 1

## 2021-11-09 NOTE — ED Triage Notes (Signed)
Pt to ED via POV c/o bilat hip pain and right knee pain. No known injury. Recently prescribed walker.

## 2021-11-09 NOTE — Discharge Instructions (Signed)
Your x-ray does not show any acute injury today but you do have significant arthritis as you already know.  You may take the medication prescribed for pain relief but use caution with this medication as it will make you drowsy.  Do not drive within 4 hours of taking this medication.  I also recommend a gentle heating pad 20 minutes 3-4 times daily which can also help to relieve arthritis pain.  Plan to see your primary doctor or the orthopedic doctor that you are being referred to at [facility] for continued care of your chronic knee pain and arthritis.

## 2021-11-10 ENCOUNTER — Other Ambulatory Visit (HOSPITAL_COMMUNITY): Payer: Self-pay

## 2021-11-10 MED ORDER — FLUTICASONE FUROATE-VILANTEROL 200-25 MCG/ACT IN AEPB
1.0000 | INHALATION_SPRAY | Freq: Every day | RESPIRATORY_TRACT | 2 refills | Status: DC
Start: 2021-11-10 — End: 2021-12-15

## 2021-11-10 NOTE — Telephone Encounter (Signed)
MH,  Please look at chart. Insurance is only willing to cover just 1 month of Wixella.   But what is covered is   S[employer].  Please advise sir

## 2021-11-10 NOTE — Telephone Encounter (Signed)
Called and left voicemail for patient regarding new inhaler per MH. Advised her that if she had any question sto call office back. Nothing further needed

## 2021-11-10 NOTE — Addendum Note (Signed)
Addended by: Monna Fam L on: 11/10/2021 10:10 AM   Modules accepted: Orders

## 2021-11-10 NOTE — Telephone Encounter (Signed)
Breo high dose 1 puff once a day

## 2021-11-10 NOTE — Telephone Encounter (Signed)
Patient's plan will not cover more than a 1 month supply. Her Wixela copay is zero for the 1 month supply.

## 2021-11-12 NOTE — ED Provider Notes (Signed)
[facility] Provider Note   CSN: [id number] Arrival date & time: 11/09/21  1349     History  Chief Complaint  Patient presents with   Knee Pain    right   Hip Pain    bilat    Kathryn [name] is a 51 y.o. female  The history is provided by the patient.  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  she has had a left knee arthroplasty and is currently in the pre op stages for having the right completed as well) Prior injury to area:  No Relieved by:  Nothing Worsened by:  Bearing weight (recently started using a walker) Ineffective treatments:  Acetaminophen Associated symptoms: no fever, no muscle weakness, no numbness and no swelling   Hip Pain       Home Medications Prior to Admission medications   Medication Sig Start Date End Date Taking? Authorizing Provider  amLODipine (NORVASC) 5 MG tablet Take 1 tablet (5 mg total) by mouth daily. 10/23/21  Yes Nicholes Rough, NP  anagrelide (AGRYLIN) 1 MG capsule Take 1 capsule (1 mg total) by mouth 2 (two) times daily. 10/22/21  Yes Nicholes Rough, NP  benztropine (COGENTIN) 0.5 MG tablet Take 1 tablet (0.5 mg total) by mouth 2 (two) times daily as needed for tremors (EPS/dystonia). 10/22/21  Yes Nicholes Rough, NP  citalopram (CELEXA) 20 MG tablet Take 1 tablet (20 mg total) by mouth daily. 10/22/21  Yes Nicholes Rough, NP  furosemide (LASIX) 20 MG tablet TAKE 1 TABLET BY MOUTH DAILY 07/05/21  Yes Lindell Spar, MD  lubiprostone (AMITIZA) 24 MCG capsule Take 1 capsule (24 mcg total) by mouth 2 (two) times daily with a meal. 10/22/21  Yes Nkwenti, Doris, NP  melatonin 3 MG TABS tablet Take 1 tablet (3 mg total) by mouth at  bedtime. 10/22/21  Yes Nicholes Rough, NP  methocarbamol (ROBAXIN) 500 MG tablet Take 500 mg by mouth daily as needed for muscle spasms. 10/25/21  Yes [provider]  mometasone-formoterol (DULERA) 200-5 MCG/ACT AERO Inhale 2 puffs into the lungs 2 (two) times daily. 10/22/21  Yes Nicholes Rough, NP  pantoprazole (PROTONIX) 40 MG tablet Take 1 tablet (40 mg total) by mouth daily. 10/23/21  Yes Nkwenti, Doris, NP  potassium chloride SA (KLOR-CON M) 20 MEQ tablet TAKE (1) TABLET BY MOUTH ONCE A DAY. Patient taking differently: Take 20 mEq by mouth daily. 10/06/21  Yes Lindell Spar, MD  pregabalin (LYRICA) 150 MG capsule Take 1 capsule (150 mg total) by mouth 2 (two) times daily. 09/06/21  Yes Lindell Spar, MD  risperiDONE (RISPERDAL) 0.5 MG tablet Take 1 tablet (0.5 mg total) by mouth daily. 10/23/21  Yes Nicholes Rough, NP  risperiDONE (RISPERDAL) 1 MG tablet Take 1 tablet (1 mg total) by mouth at bedtime. 10/22/21  Yes Nkwenti, Doris, NP  tiZANidine (ZANAFLEX) 4 MG tablet TAKE 1 TABLETEVERY 8 HOURS AS NEEDED FOR MUSCLE SPASMS. Patient taking differently: Take 4 mg by mouth every 8 (eight) hours as needed for muscle spasms. 11/07/21  Yes Lindell Spar, MD  vitamin B-12  1000 MCG tablet Take 1 tablet (1,000 mcg total) by mouth daily. 10/23/21  Yes Nkwenti, Doris, NP  ELIQUIS 5 MG TABS tablet Take 5 mg by mouth 2 (two) times daily. Patient not taking: Reported on 11/09/2021 11/01/21   [provider]  fluticasone furoate-vilanterol (BREO ELLIPTA) 200-25 MCG/ACT AEPB Inhale 1 puff into the lungs daily. 11/10/21   Hunsucker, Bonna Gains, MD  oxyCODONE (ROXICODONE) 5 MG immediate release tablet Take 1 tablet (5 mg total) by mouth every 6 (six) hours as needed for severe pain. 11/09/21   Evalee Jefferson, PA-C  sucralfate (CARAFATE) 1 g tablet Take 1 g by mouth 4 (four) times daily. Patient not taking: Reported on 11/09/2021 11/01/21   [provider]  Vitamin D, Ergocalciferol, (DRISDOL) 1.25 MG  (50000 UNIT) CAPS capsule Take 1 capsule (50,000 Units total) by mouth every 7 (seven) days. Patient not taking: Reported on 11/09/2021 10/26/21   Nicholes Rough, NP      Allergies    Clonopin [clonazepam], Fish allergy, Flexeril [cyclobenzaprine hcl], Ibuprofen, Shellfish allergy, Tylenol [acetaminophen], Ace inhibitors, Peanut allergen powder-dnfp, Tramadol, and Trazodone and nefazodone    Review of Systems   Review of Systems  Constitutional:  Negative for fever.  Musculoskeletal:  Positive for arthralgias. Negative for joint swelling and myalgias.  Neurological:  Negative for weakness and numbness.    Physical Exam Updated Vital Signs BP 128/86   Pulse 77   Temp 98 F (36.7 C) (Oral)   Resp 17   Ht '4\' 11"'$  (1.499 m)   Wt 84.4 kg   LMP 06/07/2020   SpO2 94%   BMI 37.57 kg/m  Physical Exam Constitutional:      Appearance: She is well-developed.  HENT:     Head: Atraumatic.  Cardiovascular:     Comments: Pulses equal bilaterally Musculoskeletal:        General: Tenderness present.     Cervical back: Normal range of motion.     Right hip: No bony tenderness. Normal range of motion.     Left hip: No bony tenderness. Normal range of motion.     Right knee: Bony tenderness present. Decreased range of motion.     Left knee: No deformity or erythema.     Comments: Well healed incision left anterior knee  Skin:    General: Skin is warm and dry.  Neurological:     Mental Status: She is alert.     Sensory: No sensory deficit.     Motor: No weakness.     Deep Tendon Reflexes: Reflexes normal.     ED Results / Procedures / Treatments   Labs (all labs ordered are listed, but only abnormal results are displayed) Labs Reviewed - No data to display  EKG None  Radiology No results found.  Procedures Procedures    Medications Ordered in ED Medications  HYDROmorphone (DILAUDID) injection 1 mg (1 mg Intramuscular Given 11/09/21 1847)  ondansetron (ZOFRAN-ODT)  disintegrating tablet 4 mg (4 mg Oral Given 11/09/21 1847)    ED Course/ Medical Decision Making/ A&P                           Medical Decision Making Pt with known advanced djd hips and knees with right knee worsened pain, awaiting arthroplasty. Pt states had anesthesia complications with left knee surgery so is having the right knee surgery at [facility], as recommended by her local orthopedist Zachery Dakins).    Ddx including trauma (pt denies),  septic joint, exam findings do not support this possibility, gout, acute on chronic djd pain.   Amount and/or Complexity of Data Reviewed External Data Reviewed: radiology.    Details: no indication for repeating imaging today, no injury reported.  prior imaging reviewed. Radiology:     Details: n/a  Risk Prescription drug management. Diagnosis or treatment significantly limited by social determinants of health.           Final Clinical Impression(s) / ED Diagnoses Final diagnoses:  Acute pain of right knee  Primary osteoarthritis of right knee    Rx / DC Orders ED Discharge Orders          Ordered    oxyCODONE (ROXICODONE) 5 MG immediate release tablet  Every 6 hours PRN,   Status:  Discontinued        11/09/21 2002    oxyCODONE (ROXICODONE) 5 MG immediate release tablet  Every 6 hours PRN        11/09/21 2007              Evalee Jefferson, PA-C 11/12/21 [id number]    Fredia Sorrow, MD 11/15/21 [phone]

## 2021-11-14 ENCOUNTER — Ambulatory Visit: Payer: Medicaid Other | Admitting: Pulmonary Disease

## 2021-11-14 NOTE — Telephone Encounter (Signed)
Called patient left voicemail to pick up forms

## 2021-11-17 NOTE — Telephone Encounter (Signed)
Patient picked up forms.

## 2021-11-28 NOTE — Patient Instructions (Signed)
Kathryn [name]  11/28/2021     '@PREFPERIOPPHARMACY'$ @   Your procedure is scheduled on  12/02/2021.   Report to [facility] at  0900  A.M.   Call this number if you have problems the morning of surgery:  [phone]   Remember:  Follow the diet and prep instructions given to you by the office.     Use your inhaler before you come and bring your rescue inhaler with you.     Take these medicines the morning of surgery with A SIP OF WATER               norvasc, cogentin, robaxin(if needed), roxicodone(if needed), lyrica, risperdal.     Do not wear jewelry, make-up or nail polish.  Do not wear lotions, powders, or perfumes, or deodorant.  Do not shave 48 hours prior to surgery.  Men may shave face and neck.  Do not bring valuables to the hospital.  [facility] is not responsible for any belongings or valuables.  Contacts, dentures or bridgework may not be worn into surgery.  Leave your suitcase in the car.  After surgery it may be brought to your room.  For patients admitted to the hospital, discharge time will be determined by your treatment team.  Patients discharged the day of surgery will not be allowed to drive home and must  have someone with them for 24 hours.    Special instructions:   DO NOT smoke tobacco or vape for 24 hours before your procedure.  Please read over the following fact sheets that you were given. Anesthesia Post-op Instructions and Care and Recovery After Surgery      Colonoscopy, Adult, Care After The following information offers guidance on how to care for yourself after your procedure. Your health care provider may also give you more specific instructions. If you have problems or questions, contact your health care provider. What can I expect after the procedure? After the procedure, it is common to have: A small amount of blood in your stool for 24 hours after the procedure. Some gas. Mild cramping or bloating of your  abdomen. Follow these instructions at home: Eating and drinking  Drink enough fluid to keep your urine pale yellow. Follow instructions from your health care provider about eating or drinking restrictions. Resume your normal diet as told by your health care provider. Avoid heavy or fried foods that are hard to digest. Activity Rest as told by your health care provider. Avoid sitting for a long time without moving. Get up to take short walks every 1-2 hours. This is important to improve blood flow and breathing. Ask for help if you feel weak or unsteady. Return to your normal activities as told by your health care provider. Ask your health care provider what activities are safe for you. Managing cramping and bloating  Try walking around when you have cramps or feel bloated. If directed, apply heat to your abdomen as told by your health care provider. Use the heat source that your health care provider recommends, such as a moist heat pack or a heating pad. Place a towel between your skin and the heat source. Leave the heat on for 20-30 minutes. Remove the heat if your skin turns bright red. This is especially important if you are unable to feel pain, heat, or cold. You have a greater risk of getting burned. General instructions If you were given a sedative during the procedure, it can affect  you for several hours. Do not drive or operate machinery until your health care provider says that it is safe. For the first 24 hours after the procedure: Do not sign important documents. Do not drink alcohol. Do your regular daily activities at a slower pace than normal. Eat soft foods that are easy to digest. Take over-the-counter and prescription medicines only as told by your health care provider. Keep all follow-up visits. This is important. Contact a health care provider if: You have blood in your stool 2-3 days after the procedure. Get help right away if: You have more than a small spotting of  blood in your stool. You have large blood clots in your stool. You have swelling of your abdomen. You have nausea or vomiting. You have a fever. You have increasing pain in your abdomen that is not relieved with medicine. These symptoms may be an emergency. Get help right away. Call 911. Do not wait to see if the symptoms will go away. Do not drive yourself to the hospital. Summary After the procedure, it is common to have a small amount of blood in your stool. You may also have mild cramping and bloating of your abdomen. If you were given a sedative during the procedure, it can affect you for several hours. Do not drive or operate machinery until your health care provider says that it is safe. Get help right away if you have a lot of blood in your stool, nausea or vomiting, a fever, or increased pain in your abdomen. This information is not intended to replace advice given to you by your health care provider. Make sure you discuss any questions you have with your health care provider. Document Revised: 11/24/2020 Document Reviewed: 11/24/2020 Elsevier Patient Education  [place] After This sheet gives you information about how to care for yourself after your procedure. Your health care provider may also give you more specific instructions. If you have problems or questions, contact your health care provider. What can I expect after the procedure? After the procedure, it is common to have: Tiredness. Forgetfulness about what happened after the procedure. Impaired judgment for important decisions. Nausea or vomiting. Some difficulty with balance. Follow these instructions at home: For the time period you were told by your health care provider:     Rest as needed. Do not participate in activities where you could fall or become injured. Do not drive or use machinery. Do not drink alcohol. Do not take sleeping pills or medicines that cause  drowsiness. Do not make important decisions or sign legal documents. Do not take care of children on your own. Eating and drinking Follow the diet that is recommended by your health care provider. Drink enough fluid to keep your urine pale yellow. If you vomit: Drink water, juice, or soup when you can drink without vomiting. Make sure you have little or no nausea before eating solid foods. General instructions Have a responsible adult stay with you for the time you are told. It is important to have someone help care for you until you are awake and alert. Take over-the-counter and prescription medicines only as told by your health care provider. If you have sleep apnea, surgery and certain medicines can increase your risk for breathing problems. Follow instructions from your health care provider about wearing your sleep device: Anytime you are sleeping, including during daytime naps. While taking prescription pain medicines, sleeping medicines, or medicines that make you drowsy. Avoid smoking. Keep  all follow-up visits as told by your health care provider. This is important. Contact a health care provider if: You keep feeling nauseous or you keep vomiting. You feel light-headed. You are still sleepy or having trouble with balance after 24 hours. You develop a rash. You have a fever. You have redness or swelling around the IV site. Get help right away if: You have trouble breathing. You have new-onset confusion at home. Summary For several hours after your procedure, you may feel tired. You may also be forgetful and have poor judgment. Have a responsible adult stay with you for the time you are told. It is important to have someone help care for you until you are awake and alert. Rest as told. Do not drive or operate machinery. Do not drink alcohol or take sleeping pills. Get help right away if you have trouble breathing, or if you suddenly become confused. This information is not  intended to replace advice given to you by your health care provider. Make sure you discuss any questions you have with your health care provider. Document Revised: 03/08/2021 Document Reviewed: 03/06/2019 Elsevier Patient Education  [place].

## 2021-11-29 ENCOUNTER — Telehealth: Payer: Self-pay | Admitting: *Deleted

## 2021-11-29 ENCOUNTER — Encounter (HOSPITAL_COMMUNITY): Payer: Self-pay

## 2021-11-29 ENCOUNTER — Encounter (HOSPITAL_COMMUNITY)
Admission: RE | Admit: 2021-11-29 | Discharge: 2021-11-29 | Disposition: A | Discharge status: Home / Self Care | Payer: Medicare HMO | Source: Ambulatory Visit | Attending: Gastroenterology | Admitting: Gastroenterology

## 2021-11-29 DIAGNOSIS — D473 Essential (hemorrhagic) thrombocythemia: Secondary | ICD-10-CM

## 2021-11-29 NOTE — Telephone Encounter (Signed)
PA approved via [employer]. Auth# [id number], DOS: 12/02/2021 - 03/02/2022

## 2021-12-01 ENCOUNTER — Encounter (HOSPITAL_COMMUNITY): Payer: Self-pay | Admitting: Anesthesiology

## 2021-12-02 ENCOUNTER — Telehealth (INDEPENDENT_AMBULATORY_CARE_PROVIDER_SITE_OTHER): Payer: Self-pay | Admitting: Gastroenterology

## 2021-12-02 ENCOUNTER — Ambulatory Visit (HOSPITAL_COMMUNITY): Admission: RE | Admit: 2021-12-02 | Payer: Medicare HMO | Source: Home / Self Care | Admitting: Gastroenterology

## 2021-12-02 ENCOUNTER — Encounter (HOSPITAL_COMMUNITY): Admission: RE | Payer: Self-pay | Source: Home / Self Care

## 2021-12-02 DIAGNOSIS — Z1211 Encounter for screening for malignant neoplasm of colon: Secondary | ICD-10-CM

## 2021-12-02 SURGERY — COLONOSCOPY WITH PROPOFOL
Anesthesia: Monitor Anesthesia Care

## 2021-12-02 MED ORDER — LACTATED RINGERS IV SOLN: INTRAVENOUS | Status: DC

## 2021-12-02 NOTE — OR Nursing (Signed)
Spoke with office to inform that patient was a no show for PAT, and staff unable to contact patient. Procedure cancelled.

## 2021-12-02 NOTE — Telephone Encounter (Signed)
Short stay called stated this patient do not show up for her procedure - she didn't call to let anyone know she wasn't coming.

## 2021-12-09 ENCOUNTER — Telehealth: Payer: Self-pay | Admitting: Internal Medicine

## 2021-12-09 NOTE — Telephone Encounter (Signed)
We dont generally send to [facility]. She can call [employer] and ask them where we should send DME to and let us know

## 2021-12-09 NOTE — Telephone Encounter (Signed)
Patient called in requesting order for riding wheelchair be sent in to [facility].  Patient want call back.

## 2021-12-12 ENCOUNTER — Other Ambulatory Visit: Payer: Self-pay | Admitting: *Deleted

## 2021-12-12 DIAGNOSIS — R600 Localized edema: Secondary | ICD-10-CM

## 2021-12-15 ENCOUNTER — Other Ambulatory Visit: Payer: Self-pay

## 2021-12-15 ENCOUNTER — Telehealth: Payer: Self-pay | Admitting: Internal Medicine

## 2021-12-15 MED ORDER — FLUTICASONE FUROATE-VILANTEROL 200-25 MCG/ACT IN AEPB: 1.0000 | INHALATION_SPRAY | Freq: Every day | RESPIRATORY_TRACT | 2 refills | Status: DC

## 2021-12-15 MED ORDER — AMLODIPINE BESYLATE 5 MG PO TABS: 5.0000 mg | ORAL_TABLET | Freq: Every day | ORAL | 0 refills | Status: DC

## 2021-12-15 MED ORDER — PANTOPRAZOLE SODIUM 40 MG PO TBEC: 40.0000 mg | DELAYED_RELEASE_TABLET | Freq: Every day | ORAL | 0 refills | Status: DC

## 2021-12-15 NOTE — Telephone Encounter (Signed)
[facility] in for refills on     fluticasone furoate-vilanterol (BREO ELLIPTA) 200-25 MCG/ACT AEPB   amLODipine (NORVASC) 5 MG tablet    pantoprazole (PROTONIX) 40 MG tablet

## 2021-12-16 ENCOUNTER — Other Ambulatory Visit: Payer: Self-pay

## 2021-12-16 ENCOUNTER — Telehealth: Payer: Self-pay

## 2021-12-16 DIAGNOSIS — R600 Localized edema: Secondary | ICD-10-CM

## 2021-12-16 MED ORDER — POTASSIUM CHLORIDE CRYS ER 20 MEQ PO TBCR: 20.0000 meq | EXTENDED_RELEASE_TABLET | Freq: Every day | ORAL | 0 refills | Status: DC

## 2021-12-16 MED ORDER — FUROSEMIDE 20 MG PO TABS: 20.0000 mg | ORAL_TABLET | Freq: Every day | ORAL | 10 refills | Status: DC

## 2021-12-16 NOTE — Telephone Encounter (Signed)
Kathryn [name] called from [facility] regarding all of patient medication, asked to call pharmacy at [phone]

## 2021-12-20 ENCOUNTER — Encounter: Payer: Self-pay | Admitting: Nurse Practitioner

## 2021-12-20 ENCOUNTER — Ambulatory Visit (INDEPENDENT_AMBULATORY_CARE_PROVIDER_SITE_OTHER): Payer: Medicare HMO | Admitting: Nurse Practitioner

## 2021-12-20 VITALS — BP 120/75 | HR 100 | Ht 59.0 in | Wt 208.0 lb

## 2021-12-20 DIAGNOSIS — F333 Major depressive disorder, recurrent, severe with psychotic symptoms: Secondary | ICD-10-CM | POA: Diagnosis not present

## 2021-12-20 DIAGNOSIS — R45851 Suicidal ideations: Secondary | ICD-10-CM | POA: Diagnosis not present

## 2021-12-20 DIAGNOSIS — R600 Localized edema: Secondary | ICD-10-CM

## 2021-12-20 DIAGNOSIS — M48062 Spinal stenosis, lumbar region with neurogenic claudication: Secondary | ICD-10-CM | POA: Diagnosis not present

## 2021-12-20 DIAGNOSIS — Z09 Encounter for follow-up examination after completed treatment for conditions other than malignant neoplasm: Secondary | ICD-10-CM

## 2021-12-20 DIAGNOSIS — F250 Schizoaffective disorder, bipolar type: Secondary | ICD-10-CM

## 2021-12-20 DIAGNOSIS — R051 Acute cough: Secondary | ICD-10-CM | POA: Diagnosis not present

## 2021-12-20 LAB — POCT INFLUENZA A/B
Influenza A, POC: NEGATIVE
Influenza B, POC: NEGATIVE

## 2021-12-20 MED ORDER — BENZONATATE 100 MG PO CAPS: 100.0000 mg | ORAL_CAPSULE | Freq: Two times a day (BID) | ORAL | 0 refills | Status: DC | PRN

## 2021-12-20 MED ORDER — TIZANIDINE HCL 4 MG PO TABS: 4.0000 mg | ORAL_TABLET | Freq: Three times a day (TID) | ORAL | 0 refills | Status: DC | PRN

## 2021-12-20 NOTE — Patient Instructions (Addendum)
Wear compression socks daily to help decrease the swelling in your feet, keep your legs elevated when sitting , avoid salty foods. Follow up with cardiology as dicussed   Please follow up with your mental health specialist at [facility] as dicussed   Take tizanidine '4mg'$  every 8 hours as needed.   Take tessalon '100mg'$  twice daily as needed for cough, flu and covid tet today    It is important that you exercise regularly at least 30 minutes 5 times a week.  Think about what you will eat, plan ahead. Choose " clean, green, fresh or frozen" over canned, processed or packaged foods which are more sugary, salty and fatty. 70 to 75% of food eaten should be vegetables and fruit. Three meals at set times with snacks allowed between meals, but they must be fruit or vegetables. Aim to eat over a 12 hour period , example 7 am to 7 pm, and STOP after  your last meal of the day. Drink water,generally about 64 ounces per day, no other drink is as healthy. Fruit juice is best enjoyed in a healthy way, by EATING the fruit.  Thanks for choosing [facility], we consider it a privelige to serve you.

## 2021-12-20 NOTE — Assessment & Plan Note (Signed)
Tests pending Flu test negative Tessalon 100 mg twice daily as needed ordered

## 2021-12-20 NOTE — Assessment & Plan Note (Signed)
Chronic uncontrolled  condition followed by psych at [facility] Currently on latuda '40mg'$  daily, mirtazapine '15mg'$  daily  Encouraged to maintain close follow-up with psych has upcoming appointment today She denies SI,HI

## 2021-12-20 NOTE — Progress Notes (Addendum)
   Kathryn [name]     MRN: [id number]      DOB: [birth year]   HPI Kathryn [name] with past medical history of bilateral leg swelling, hypertension, cocaine induced mood disorder, major depressive disorder, GAD, schizo affective disorder polysubstance abuse is here for follow up for hospital admission for suicidal ideation.  She was admitted On 12/12/21 at [place] care and transferred to inpatient psychiatric unit at [facility] where she was discharged on 12/19/2021.  Patient currently denies suicidal ideation she was referred to behavioral health for her mental health chronic condition states that she has upcoming appointment [facility] today.   Has chronic bilateral leg swelling pain  has chronic low back pain. She has been taking lasix but no improvement in her swelling.  Coming appointment with cardiology  Sees othopedics for her chronic low back pain, refill of Zanaflex today   Patient complains of dry Cough that stared today, has not been around anyone sick.  Has fever but has chills.      ROS Denies recent fever or chills. Denies sinus pressure, nasal congestion, ear pain or sore throat. Denies chest pains, palpitations and leg swelling Denies abdominal pain, nausea, vomiting,diarrhea or constipation.   Denies dysuria, frequency, hesitancy or incontinence. Denies headaches, seizures, numbness, or tingling. Denies skin break down or rash.   PE  BP 120/75 (BP Location: Left Arm, Patient Position: Sitting, Cuff Size: Large)   Pulse 100   Ht '4\' 11"'$  (1.499 m)   Wt 208 lb (94.3 kg)   LMP 06/07/2020   SpO2 92%   BMI 42.01 kg/m   Patient alert and oriented and in no cardiopulmonary distress.  HEENT: No facial asymmetry, EOMI,     Neck supple .  Chest: Clear to auscultation bilaterally.  CVS: S1, S2 no murmurs, no S3.Regular rate.  ABD: Soft non tender.   Ext: Non pitting edema bilateral lower extremities  MS: Adequate ROM spine, shoulders, hips and knees.  Psych: Good  eye contact, normal affect. Memory intact not anxious or depressed appearing.  CNS: CN 2-12 intact, power,  normal throughout.no focal deficits noted.   Assessment & Plan  Bilateral leg edema Has upcoming appointment with cardiology  Continue Lasix 20 mg daily with potassium supplement Encouraged to wear compression socks and keep legs elevated when sitting  Hospital discharge follow-up For recheck and discharge summary reviewed today medications also reviewed has upcoming appointment with psychiatry at [facility] today She denies SI, HI  Schizoaffective disorder, bipolar type ([place]) Chronic uncontrolled  condition followed by psych at [facility] Currently on latuda '40mg'$  daily, mirtazapine '15mg'$  daily  Encouraged to maintain close follow-up with psych has upcoming appointment today She denies SI,HI  Acute cough Tests pending Flu test negative Tessalon 100 mg twice daily as needed ordered  Lumbar spinal stenosis Continue  Lyrica 150 mg twice daily Tizanidine 4 mg every 8 hours as needed refilled  Suicidal ideation Currently denies suicidal ideation Has Upcoming appointment with psych today Continue mitarzapine '15mg'$  daily , Latuda '40mg'$  daily   MDD (major depressive disorder), recurrent episode, severe ([place]) Has upcoming appointment with psych today Patient encouraged to maintain close follow-up with psych for counseling and med management   PHQ 9 score 24, GAD 7 score  19 Denies SI, HI

## 2021-12-20 NOTE — Assessment & Plan Note (Signed)
Continue  Lyrica 150 mg twice daily Tizanidine 4 mg every 8 hours as needed refilled

## 2021-12-20 NOTE — Assessment & Plan Note (Signed)
Currently denies suicidal ideation Has Upcoming appointment with psych today Continue mitarzapine '15mg'$  daily , Latuda '40mg'$  daily

## 2021-12-20 NOTE — Assessment & Plan Note (Addendum)
Has upcoming appointment with cardiology  Continue Lasix 20 mg daily with potassium supplement Encouraged to wear compression socks and keep legs elevated when sitting

## 2021-12-20 NOTE — Assessment & Plan Note (Signed)
Hospital  discharge summary reviewed today medications also reviewed has upcoming appointment with psychiatry at [facility] today She denies SI, HI

## 2021-12-20 NOTE — Assessment & Plan Note (Addendum)
Has upcoming appointment with psych today Patient encouraged to maintain close follow-up with psych for counseling and med management   PHQ 9 score 24, GAD 7 score  19 Denies SI, HI

## 2021-12-21 NOTE — Addendum Note (Signed)
Addended by: Vena Rua R on: 12/21/2021 09:00 AM   Modules accepted: Level of Service

## 2021-12-28 LAB — NOVEL CORONAVIRUS, NAA

## 2021-12-29 ENCOUNTER — Telehealth: Payer: Self-pay | Admitting: Internal Medicine

## 2021-12-29 NOTE — Telephone Encounter (Signed)
[employer] manage benefits forms    Noted  Copied  Sleeved   In brown folder

## 2021-12-30 ENCOUNTER — Ambulatory Visit: Payer: Medicare HMO | Admitting: Internal Medicine

## 2021-12-30 ENCOUNTER — Telehealth: Payer: Self-pay

## 2021-12-30 NOTE — Telephone Encounter (Signed)
Called patient. Will pick up forms and mail out in envelope. Patient needs to complete her section of form before mailing out.

## 2021-12-30 NOTE — Telephone Encounter (Signed)
error 

## 2022-01-05 ENCOUNTER — Ambulatory Visit (INDEPENDENT_AMBULATORY_CARE_PROVIDER_SITE_OTHER): Payer: Medicaid Other | Admitting: Gastroenterology

## 2022-01-06 ENCOUNTER — Inpatient Hospital Stay: Payer: 59 | Attending: Physician Assistant

## 2022-01-06 ENCOUNTER — Inpatient Hospital Stay: Payer: 59 | Admitting: Hematology and Oncology

## 2022-01-06 ENCOUNTER — Other Ambulatory Visit: Payer: Self-pay

## 2022-01-06 ENCOUNTER — Emergency Department (HOSPITAL_COMMUNITY)
Admission: EM | Admit: 2022-01-06 | Discharge: 2022-01-08 | Disposition: A | Discharge status: Home / Self Care | Payer: Medicare HMO | Attending: Emergency Medicine | Admitting: Emergency Medicine

## 2022-01-06 ENCOUNTER — Encounter (HOSPITAL_COMMUNITY): Payer: Self-pay

## 2022-01-06 DIAGNOSIS — Z91010 Allergy to peanuts: Secondary | ICD-10-CM | POA: Diagnosis not present

## 2022-01-06 DIAGNOSIS — Z7951 Long term (current) use of inhaled steroids: Secondary | ICD-10-CM | POA: Diagnosis not present

## 2022-01-06 DIAGNOSIS — Z87891 Personal history of nicotine dependence: Secondary | ICD-10-CM | POA: Insufficient documentation

## 2022-01-06 DIAGNOSIS — Z20822 Contact with and (suspected) exposure to covid-19: Secondary | ICD-10-CM | POA: Diagnosis not present

## 2022-01-06 DIAGNOSIS — Z7901 Long term (current) use of anticoagulants: Secondary | ICD-10-CM | POA: Insufficient documentation

## 2022-01-06 DIAGNOSIS — J4540 Moderate persistent asthma, uncomplicated: Secondary | ICD-10-CM | POA: Diagnosis not present

## 2022-01-06 DIAGNOSIS — Z79899 Other long term (current) drug therapy: Secondary | ICD-10-CM | POA: Insufficient documentation

## 2022-01-06 DIAGNOSIS — M545 Low back pain, unspecified: Secondary | ICD-10-CM | POA: Insufficient documentation

## 2022-01-06 DIAGNOSIS — R45851 Suicidal ideations: Secondary | ICD-10-CM | POA: Insufficient documentation

## 2022-01-06 DIAGNOSIS — J449 Chronic obstructive pulmonary disease, unspecified: Secondary | ICD-10-CM | POA: Diagnosis not present

## 2022-01-06 DIAGNOSIS — F332 Major depressive disorder, recurrent severe without psychotic features: Secondary | ICD-10-CM | POA: Insufficient documentation

## 2022-01-06 DIAGNOSIS — F1494 Cocaine use, unspecified with cocaine-induced mood disorder: Secondary | ICD-10-CM | POA: Diagnosis not present

## 2022-01-06 DIAGNOSIS — R32 Unspecified urinary incontinence: Secondary | ICD-10-CM | POA: Insufficient documentation

## 2022-01-06 LAB — COMPREHENSIVE METABOLIC PANEL
ALT: 19 U/L (ref 0–44)
AST: 18 U/L (ref 15–41)
Albumin: 3.5 g/dL (ref 3.5–5.0)
Alkaline Phosphatase: 74 U/L (ref 38–126)
Anion gap: 10 (ref 5–15)
BUN: 7 mg/dL (ref 6–20)
CO2: 21 mmol/L — ABNORMAL LOW (ref 22–32)
Calcium: 8.8 mg/dL — ABNORMAL LOW (ref 8.9–10.3)
Chloride: 106 mmol/L (ref 98–111)
Creatinine, Ser: 0.67 mg/dL (ref 0.44–1.00)
GFR, Estimated: >60 mL/min (ref >60)
Glucose, Bld: 83 mg/dL (ref 70–99)
Potassium: 3.4 mmol/L — ABNORMAL LOW (ref 3.5–5.1)
Sodium: 137 mmol/L (ref 135–145)
Total Bilirubin: 0.6 mg/dL (ref 0.3–1.2)
Total Protein: 7.2 g/dL (ref 6.5–8.1)

## 2022-01-06 LAB — CBC WITH DIFFERENTIAL/PLATELET
Abs Immature Granulocytes: 0.02 10*3/uL (ref 0.00–0.07)
Basophils Absolute: 0 10*3/uL (ref 0.0–0.1)
Basophils Relative: 1 %
Eosinophils Absolute: 0.1 10*3/uL (ref 0.0–0.5)
Eosinophils Relative: 2 %
HCT: 38.6 % (ref 36.0–46.0)
Hemoglobin: 12.7 g/dL (ref 12.0–15.0)
Immature Granulocytes: 0 %
Lymphocytes Relative: 29 %
Lymphs Abs: 1.8 10*3/uL (ref 0.7–4.0)
MCH: 28.3 pg (ref 26.0–34.0)
MCHC: 32.9 g/dL (ref 30.0–36.0)
MCV: 86.2 fL (ref 80.0–100.0)
Monocytes Absolute: 0.4 10*3/uL (ref 0.1–1.0)
Monocytes Relative: 6 %
Neutro Abs: 4 10*3/uL (ref 1.7–7.7)
Neutrophils Relative %: 62 %
Platelets: 651 10*3/uL — ABNORMAL HIGH (ref 150–400)
RBC: 4.48 MIL/uL (ref 3.87–5.11)
RDW: 17.2 % — ABNORMAL HIGH (ref 11.5–15.5)
WBC: 6.4 10*3/uL (ref 4.0–10.5)
nRBC: 0 % (ref 0.0–0.2)

## 2022-01-06 LAB — RESP PANEL BY RT-PCR (FLU A&B, COVID) ARPGX2
Influenza A by PCR: NEGATIVE
Influenza B by PCR: NEGATIVE
SARS Coronavirus 2 by RT PCR: NEGATIVE

## 2022-01-06 LAB — RAPID URINE DRUG SCREEN, HOSP PERFORMED
Amphetamines: NOT DETECTED
Barbiturates: NOT DETECTED
Benzodiazepines: NOT DETECTED
Cocaine: POSITIVE — AB
Opiates: NOT DETECTED
Tetrahydrocannabinol: NOT DETECTED

## 2022-01-06 LAB — ACETAMINOPHEN LEVEL: Acetaminophen (Tylenol), Serum: <10 ug/mL — ABNORMAL LOW (ref 10–30)

## 2022-01-06 LAB — SALICYLATE LEVEL: Salicylate Lvl: <7 mg/dL — ABNORMAL LOW (ref 7.0–30.0)

## 2022-01-06 LAB — ETHANOL: Alcohol, Ethyl (B): <10 mg/dL (ref <10)

## 2022-01-06 LAB — POC URINE PREG, ED: Preg Test, Ur: NEGATIVE

## 2022-01-06 MED ORDER — APIXABAN 5 MG PO TABS
5.0000 mg | ORAL_TABLET | Freq: Two times a day (BID) | ORAL | Status: DC
  Administered 2022-01-06 – 2022-01-08 (×4): 5 mg via ORAL
  Filled 2022-01-06 (×4): qty 1

## 2022-01-06 MED ORDER — FUROSEMIDE 40 MG PO TABS
20.0000 mg | ORAL_TABLET | Freq: Every day | ORAL | Status: DC
  Administered 2022-01-06 – 2022-01-08 (×3): 20 mg via ORAL
  Filled 2022-01-06 (×3): qty 1

## 2022-01-06 MED ORDER — OXYCODONE HCL 5 MG PO TABS
5.0000 mg | ORAL_TABLET | Freq: Once | ORAL | Status: AC
  Administered 2022-01-06: 5 mg via ORAL
  Filled 2022-01-06: qty 1

## 2022-01-06 MED ORDER — POTASSIUM CHLORIDE CRYS ER 20 MEQ PO TBCR
20.0000 meq | EXTENDED_RELEASE_TABLET | Freq: Every day | ORAL | Status: DC
  Administered 2022-01-06 – 2022-01-08 (×3): 20 meq via ORAL
  Filled 2022-01-06 (×3): qty 1

## 2022-01-06 MED ORDER — PANTOPRAZOLE SODIUM 40 MG PO TBEC
40.0000 mg | DELAYED_RELEASE_TABLET | Freq: Every day | ORAL | Status: DC
  Administered 2022-01-06 – 2022-01-08 (×3): 40 mg via ORAL
  Filled 2022-01-06 (×3): qty 1

## 2022-01-06 MED ORDER — AMLODIPINE BESYLATE 5 MG PO TABS
5.0000 mg | ORAL_TABLET | Freq: Every day | ORAL | Status: DC
  Administered 2022-01-06 – 2022-01-08 (×3): 5 mg via ORAL
  Filled 2022-01-06 (×3): qty 1

## 2022-01-06 MED ORDER — MOMETASONE FURO-FORMOTEROL FUM 200-5 MCG/ACT IN AERO
2.0000 | INHALATION_SPRAY | Freq: Two times a day (BID) | RESPIRATORY_TRACT | Status: DC
  Administered 2022-01-06 – 2022-01-08 (×4): 2 via RESPIRATORY_TRACT
  Filled 2022-01-06 (×2): qty 8.8

## 2022-01-06 MED ORDER — BENZTROPINE MESYLATE 1 MG PO TABS: 0.5000 mg | ORAL_TABLET | Freq: Two times a day (BID) | ORAL | Status: DC | PRN
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
## 2022-01-06 NOTE — Assessment & Plan Note (Deleted)
08/31/2020: Right leg: DVT involving posterior tibial and right peroneal vein, left leg: Age-indeterminate DVT left gastrocnemius vein Repeat ultrasounds 01/12/2021, 05/23/2021, 07/02/2021: Negative for DVT  MPN Panel: CALR [id number]  Current treatment: 1.  Anagrelide 1 mg p.o. twice daily started 10/05/2021 2. aspirin was felt not to be necessary because CALR is not associated with a marked increase in thrombosis  Lab review:  Return to clinic in 3 months for follow-up with labs

## 2022-01-06 NOTE — ED Triage Notes (Signed)
Pt c/o SI because she is homeless and depression has increased. Pt also c/o back pain from a fall yesterday. Pt states she has been able to ambulate.

## 2022-01-06 NOTE — ED Provider Notes (Signed)
[facility] EMERGENCY DEPARTMENT Provider Note  CSN: [id number] Arrival date & time: 01/06/22 1321  Chief Complaint(s) Suicidal and Back Pain  HPI Kathryn [name] is a 51 y.o. female with history of schizoaffective disorder, COPD, PE on Eliquis, bipolar disorder presenting to the emergency department suicidal ideation.  Patient reports that she feels suicidal with a plan to cut her wrists.  She denies taking any medicines to harm herself or doing anything to harm herself at this time.  Reports 1 prior psychiatric hospitalization.  Denies any hallucinations.  Denies any homicidal ideation.  Symptoms are constant.  She reports the primary trigger for her is being homeless  Patient also reports that she fell yesterday, landing on her bottom.  She reports bilateral low back pain, radiating down her legs.  She reports chronic urinary incontinence after prior back surgery but denies any new or worsening urinary incontinence, groin anesthesia, fecal incontinence, fevers or chills, numbness or tingling.  She denies any urinary symptoms.  She also reports some chronic leg pain which is unchanged.   Past Medical History Past Medical History:  Diagnosis Date   Anxiety    Arthritis    Asthma    Bipolar disorder ([place])    Chronic back pain    COPD (chronic obstructive pulmonary disease) (HCC)    Chronic bronchitis   Depression    Dyspnea    GERD (gastroesophageal reflux disease)    Hypertension    Migraine    Neuropathy    Osteoarthritis of left knee, 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   Single subsegmental pulmonary embolism without acute cor pulmonale ([place]) 06/20/2021   Sleep apnea 04/2020   GETTING A cpap   Suicidal ideation 12/24/2018   Patient Active Problem List   Diagnosis Date Noted   Acute cough 12/20/2021   Hospital discharge follow-up 11/03/2021   Polysubstance abuse (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   Encounter for examination following treatment at hospital 01/17/2021   Preoperative examination 01/17/2021   Bilateral leg edema 10/18/2020   Morbid obesity ([place]) 10/18/2020   S/P lumbar fusion 09/14/2020   History of DVT of lower extremity 09/14/2020   Spondylolisthesis at L4-L5 level 03/29/2020   Insomnia 03/19/2020   Moderate persistent asthma without complication 71/09/2692   Body mass index (BMI) 38.0-38.9, adult 03/19/2020   GERD (gastroesophageal reflux disease) 03/19/2020   Intermittent palpitations 12/17/2019   Excessive daytime sleepiness 12/17/2019   Suicidal ideation 12/24/2018   Enlarged uterus 02/20/2018   OA (osteoarthritis) of knee 12/27/2017   MDD (major depressive disorder), recurrent episode, severe ([place]) 12/24/2017   Lumbar spinal stenosis 05/11/2017   Low vitamin B12 level 05/11/2017   Hypertension 01/11/2017   Home Medication(s) Prior to Admission medications   Medication Sig Start Date End Date Taking? Authorizing Provider  amLODipine (NORVASC) 5 MG tablet Take 1 tablet (5 mg total) by mouth daily. 12/15/21   Lindell Spar, MD  anagrelide (AGRYLIN) 1 MG capsule Take 1 capsule (1 mg total) by mouth 2 (two) times daily. 10/22/21   Nicholes Rough, NP  benzonatate (TESSALON)  100 MG capsule Take 1 capsule (100 mg total) by mouth 2 (two) times daily as needed for cough. 12/20/21   Paseda, Dewaine Conger, FNP  benztropine (COGENTIN) 0.5 MG tablet Take 1 tablet (0.5 mg total) by mouth 2 (two) times daily as needed for tremors (EPS/dystonia). 10/22/21   Nicholes Rough, NP   citalopram (CELEXA) 20 MG tablet Take 1 tablet (20 mg total) by mouth daily. 10/22/21   Nkwenti, Tamela Oddi, NP  ELIQUIS 5 MG TABS tablet Take 5 mg by mouth 2 (two) times daily. Patient not taking: Reported on 11/09/2021 11/01/21   [provider]  fluticasone furoate-vilanterol (BREO ELLIPTA) 200-25 MCG/ACT AEPB Inhale 1 puff into the lungs daily. 12/15/21   Lindell Spar, MD  furosemide (LASIX) 20 MG tablet Take 1 tablet (20 mg total) by mouth daily. 12/16/21   Lindell Spar, MD  lubiprostone (AMITIZA) 24 MCG capsule Take 1 capsule (24 mcg total) by mouth 2 (two) times daily with a meal. 10/22/21   Nicholes Rough, NP  lurasidone (LATUDA) 40 MG TABS tablet Take 40 mg by mouth daily with breakfast.    [provider]  melatonin 3 MG TABS tablet Take 1 tablet (3 mg total) by mouth at bedtime. 10/22/21   Nicholes Rough, NP  mirtazapine (REMERON) 15 MG tablet Take 15 mg by mouth at bedtime.    [provider]  mometasone-formoterol (DULERA) 200-5 MCG/ACT AERO Inhale 2 puffs into the lungs 2 (two) times daily. 10/22/21   Nicholes Rough, NP  oxyCODONE (ROXICODONE) 5 MG immediate release tablet Take 1 tablet (5 mg total) by mouth every 6 (six) hours as needed for severe pain. 11/09/21   Evalee Jefferson, PA-C  pantoprazole (PROTONIX) 40 MG tablet Take 1 tablet (40 mg total) by mouth daily. 12/15/21   Lindell Spar, MD  potassium chloride SA (KLOR-CON M) 20 MEQ tablet Take 1 tablet (20 mEq total) by mouth daily. TAKE (1) TABLET BY MOUTH ONCE A DAY. Strength: 20 mEq 12/16/21   Lindell Spar, MD  pregabalin (LYRICA) 150 MG capsule Take 1 capsule (150 mg total) by mouth 2 (two) times daily. 09/06/21   Lindell Spar, MD  risperiDONE (RISPERDAL) 0.5 MG tablet Take 1 tablet (0.5 mg total) by mouth daily. Patient not taking: Reported on 12/20/2021 10/23/21   Nicholes Rough, NP  risperiDONE (RISPERDAL) 1 MG tablet Take 1 tablet (1 mg total) by mouth at bedtime. Patient not taking: Reported on 12/20/2021  10/22/21   Nicholes Rough, NP  sucralfate (CARAFATE) 1 g tablet Take 1 g by mouth 4 (four) times daily. Patient not taking: Reported on 12/20/2021 11/01/21   [provider]  tiZANidine (ZANAFLEX) 4 MG tablet Take 1 tablet (4 mg total) by mouth every 8 (eight) hours as needed for muscle spasms. 12/20/21   Renee Rival, FNP  vitamin B-12 1000 MCG tablet Take 1 tablet (1,000 mcg total) by mouth daily. 10/23/21   Nicholes Rough, NP  Vitamin D, Ergocalciferol, (DRISDOL) 1.25 MG (50000 UNIT) CAPS capsule Take 1 capsule (50,000 Units total) by mouth every 7 (seven) days. 10/26/21   Nicholes Rough, NP  Past Surgical History Past Surgical History:  Procedure Laterality Date   BACK SURGERY     DILATATION AND CURETTAGE/HYSTEROSCOPY WITH MINERVA N/A 06/09/2020   Procedure: DILATATION AND CURETTAGE/HYSTEROSCOPY WITH MINERVA;  Surgeon: Florian Buff, MD;  Location: [facility];  Service: Gynecology;  Laterality: N/A;   ESOPHAGOGASTRODUODENOSCOPY (EGD) WITH PROPOFOL N/A 12/25/2018   Procedure: ESOPHAGOGASTRODUODENOSCOPY (EGD) WITH PROPOFOL;  Surgeon: Rogene Houston, MD;  Location: [facility] SU[facility];  Service: Endoscopy;  Laterality: N/A;   FLEXIBLE SIGMOIDOSCOPY  10/14/2021   Procedure: FLEXIBLE SIGMOIDOSCOPY;  Surgeon: Harvel Quale, MD;  Location: [facility];  Service: Gastroenterology;;   PATELLA-FEMORAL ARTHROPLASTY Left 10/08/2018   Procedure: PATELLA-FEMORAL ARTHROPLASTY;  Surgeon: Marchia Bond, MD;  Location: [facility];  Service: Orthopedics;  Laterality: Left;   TUBAL LIGATION     Family History Family History  Problem Relation Age of Onset   Gout Paternal Grandfather    Cirrhosis Paternal Grandfather    Hypertension Paternal Grandmother    Aneurysm Paternal Grandmother    Cirrhosis Maternal Grandmother    Cirrhosis Maternal Grandfather    Cancer  Father    Cirrhosis Father    Cirrhosis Mother    Breast cancer Sister    Hypertension Sister    Bronchitis Daughter    Bronchitis Daughter    Asthma Son    Bronchitis Son    Migraines Neg Hx     Social History Social History   Tobacco Use   Smoking status: Former    Packs/day: 3.00    Years: 5.00    Total pack years: 15.00    Types: Cigarettes    Passive exposure: Current   Smokeless tobacco: Never   Tobacco comments:    Occasional smoker  Vaping Use   Vaping Use: Never used  Substance Use Topics   Alcohol use: Not Currently   Drug use: Not Currently    Types: Cocaine    Comment: crack  last used June 2023   Allergies Clonopin [clonazepam], Fish allergy, Flexeril [cyclobenzaprine hcl], Ibuprofen, Shellfish allergy, Tylenol [acetaminophen], Ace inhibitors, Peanut allergen powder-dnfp, Tramadol, and Trazodone and nefazodone  Review of Systems Review of Systems  All other systems reviewed and are negative.   Physical Exam Vital Signs  I have reviewed the triage vital signs BP 129/78   Pulse 72   Temp (!) 97.4 F (36.3 C) (Oral)   Resp 16   Ht '4\' 11"'$  (1.499 m)   Wt 74.8 kg   LMP 06/07/2020   SpO2 98%   BMI 33.33 kg/m  Physical Exam Vitals and nursing note reviewed.  Constitutional:      General: She is not in acute distress.    Appearance: She is well-developed.  HENT:     Head: Normocephalic and atraumatic.     Mouth/Throat:     Mouth: Mucous membranes are moist.  Eyes:     Pupils: Pupils are equal, round, and reactive to light.  Cardiovascular:     Rate and Rhythm: Normal rate and regular rhythm.     Heart sounds: No murmur heard. Pulmonary:     Effort: Pulmonary effort is normal. No respiratory distress.     Breath sounds: Normal breath sounds.  Abdominal:     General: Abdomen is flat.     Palpations: Abdomen is soft.     Tenderness: There is no abdominal tenderness.  Musculoskeletal:     Right lower leg: No edema.     Left lower leg: No  edema.  Comments: Mild bilateral low back paraspinal tenderness.  No midline tenderness.  Skin:    General: Skin is warm and dry.  Neurological:     General: No focal deficit present.     Mental Status: She is alert. Mental status is at baseline.     Comments: Strength 5 out of 5 in bilateral lower extremities, no sensory deficit to light touch.  Psychiatric:        Mood and Affect: Mood normal.        Behavior: Behavior normal.        Thought Content: Thought content is not paranoid. Thought content includes suicidal ideation. Thought content does not include homicidal ideation. Thought content includes suicidal plan. Thought content does not include homicidal plan.     ED Results and Treatments Labs (all labs ordered are listed, but only abnormal results are displayed) Labs Reviewed  COMPREHENSIVE METABOLIC PANEL - Abnormal; Notable for the following components:      Result Value   Potassium 3.4 (*)    CO2 21 (*)    Calcium 8.8 (*)    All other components within normal limits  CBC WITH DIFFERENTIAL/PLATELET - Abnormal; Notable for the following components:   RDW 17.2 (*)    Platelets 651 (*)    All other components within normal limits  ACETAMINOPHEN LEVEL - Abnormal; Notable for the following components:   Acetaminophen (Tylenol), Serum <10 (*)    All other components within normal limits  SALICYLATE LEVEL - Abnormal; Notable for the following components:   Salicylate Lvl <3.8 (*)    All other components within normal limits  RESP PANEL BY RT-PCR (FLU A&B, COVID) ARPGX2  ETHANOL  RAPID URINE DRUG SCREEN, HOSP PERFORMED  POC URINE PREG, ED                                                                                                                          Radiology No results found.  Pertinent labs & imaging results that were available during my care of the patient were reviewed by me and considered in my medical decision making (see MDM for  details).  Medications Ordered in ED Medications - No data to display                                                                                                                                   Procedures Procedures  (  including critical care time)  Medical Decision Making / ED Course   MDM:  51 year old female presenting to the emergency department with suicidal ideation.  Patient overall well-appearing, exam notable for bilateral paraspinal muscular tenderness, no midline tenderness.  Neurologic exam reassuring.  Given suicidal ideation, will clear medically.  Very low concern for acute spinal cord injury, occult infectious process, fracture.  Patient has no midline pain, no new neurologic symptoms, reports some chronic urinary incontinence which is unchanged.  Suspect muscular strain from fall.  Patient denies hitting her head or other injuries from the fall.  No signs of trauma.  Clinical Course as of 01/06/22 1605  Fri Jan 06, 2022  1604 Patient medically cleared.  Pending COVID test.  Will be boarding in the emergency department. [WS]    Clinical Course User Index [WS] Cristie Hem, MD     Additional history obtained: -External records from outside source obtained and reviewed including: Chart review including previous notes, labs, imaging, consultation notes   Lab Tests: -I ordered, reviewed, and interpreted labs.   The pertinent results include:   Labs Reviewed  COMPREHENSIVE METABOLIC PANEL - Abnormal; Notable for the following components:      Result Value   Potassium 3.4 (*)    CO2 21 (*)    Calcium 8.8 (*)    All other components within normal limits  CBC WITH DIFFERENTIAL/PLATELET - Abnormal; Notable for the following components:   RDW 17.2 (*)    Platelets 651 (*)    All other components within normal limits  ACETAMINOPHEN LEVEL - Abnormal; Notable for the following components:   Acetaminophen (Tylenol), Serum <10 (*)    All other components  within normal limits  SALICYLATE LEVEL - Abnormal; Notable for the following components:   Salicylate Lvl <8.3 (*)    All other components within normal limits  RESP PANEL BY RT-PCR (FLU A&B, COVID) ARPGX2  ETHANOL  RAPID URINE DRUG SCREEN, HOSP PERFORMED  POC URINE PREG, ED     Medicines ordered and prescription drug management: No orders of the defined types were placed in this encounter.   -I have reviewed the patients home medicines and have made adjustments as needed   Social Determinants of Health:  Factors impacting patients care include: polysubstance abuse   Reevaluation: After the interventions noted above, I reevaluated the patient and found that they have stayed the same  Co morbidities that complicate the patient evaluation  Past Medical History:  Diagnosis Date   Anxiety    Arthritis    Asthma    Bipolar disorder (HCC)    Chronic back pain    COPD (chronic obstructive pulmonary disease) (HCC)    Chronic bronchitis   Depression    Dyspnea    GERD (gastroesophageal reflux disease)    Hypertension    Migraine    Neuropathy    Osteoarthritis of left knee, 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   Single subsegmental pulmonary embolism without acute cor pulmonale ([place]) 06/20/2021   Sleep apnea 04/2020   GETTING A cpap   Suicidal ideation 12/24/2018      Dispostion: Boarding for psychiatric evaluation    Final Clinical Impression(s) / ED Diagnoses Final diagnoses:  Suicidal ideation     This chart was dictated using voice recognition software.  Despite best efforts to proofread,  errors can occur which can change the documentation meaning.    Cristie Hem, MD 01/06/22 [phone]

## 2022-01-06 NOTE — BH Assessment (Signed)
TTS spoke to CN, to put pt in a private room to complete TTS assessment.  Clinician to call the cart in five minutes.

## 2022-01-06 NOTE — ED Notes (Signed)
Pt wanded °

## 2022-01-06 NOTE — ED Notes (Signed)
Pt ambulated to the bathroom with sitter

## 2022-01-06 NOTE — BH Assessment (Addendum)
Per Jerene Pitch, NP, pt recommended for inpt treatment. @ 1734, requested [facility] Claiborne Billings, RN) to review for admission to [facility]. Patient also referred to outside facilities for consideration of bed placement. See list of outside hospitals noted below.   Destination Service Provider Request Status Selected Services Address Phone Fax Patient Preferred  [facility] [address], [place] [address] [phone] --  [place] N/A [address] [place], [place] [address] [phone] --  [place] N/A [address], [place] [address] [phone] --  [facility]  Pending - Request Sent N/A [address], [place] [address] [phone] --  [place] N/A [place], [place] [address] [phone] [address] [place] [address] [place] [phone] [address] [place] [address] [place] --  [facility]  Pending - Request Sent N/A [address] [place], [place] [address] [phone] --  [facility]  Pending - Request Sent N/A [address], [place] [address] [phone] --  [facility]  Pending - Request Sent N/A [address] [place] [address] [phone] --  [facility]  Pending - Request Sent N/A [address] [place] [address] [phone] --  [facility]  Pending - Request Sent N/A [place] [address], [place] [address] [phone] --  [facility]  Pending - Request Sent  N/A [address] [place] [address] [phone] --  [place] N/A [address], [place] [address] [phone] --  [facility]  Pending - Request Sent N/A [address], [place] [address] [phone] --  [facility]  Pending - Request Sent N/A [place], [place] [address] [phone] --  [facility]  Pending - Request Sent N/A [address], [place] [address] [phone] --  [facility]  Pending - Request Sent N/A [place] [place] [address] [phone] --  [facility]  Pending - Request Sent N/A [address], [place] [address] [phone] --  [place] N/A [address], [place] [address] [phone] --  [facility]  Pending - Request Sent N/A [address] [place] [address] [phone] --  [facility]  Pending - Request Sent N/A [address] [place] [address] [phone] --  [place] N/A [place], [place] [address] [phone] --  [facility]  Pending - Request Sent N/A [address] [place] [address] [phone] --  [facility]  Pending - Request Sent N/A [address] [place] [address] [phone] --  [place]  Pending - Request Sent N/A [address], [place] [address] [phone] --  [facility]  Pending - Request Sent N/A [address], [place] [address] [phone] --

## 2022-01-06 NOTE — ED Notes (Signed)
Pt given graham crackers and sprite

## 2022-01-06 NOTE — ED Notes (Addendum)
TTS at this time.

## 2022-01-07 DIAGNOSIS — F332 Major depressive disorder, recurrent severe without psychotic features: Secondary | ICD-10-CM | POA: Diagnosis not present

## 2022-01-07 MED ORDER — VITAMIN B-12 1000 MCG PO TABS
1000.0000 ug | ORAL_TABLET | Freq: Every day | ORAL | Status: DC
  Administered 2022-01-07 – 2022-01-08 (×2): 1000 ug via ORAL
  Filled 2022-01-07 (×2): qty 1

## 2022-01-07 MED ORDER — HYDROXYZINE HCL 25 MG PO TABS
100.0000 mg | ORAL_TABLET | Freq: Two times a day (BID) | ORAL | Status: DC
  Administered 2022-01-07 – 2022-01-08 (×3): 100 mg via ORAL
  Filled 2022-01-07 (×3): qty 4

## 2022-01-07 MED ORDER — CLONIDINE HCL 0.1 MG PO TABS
0.1000 mg | ORAL_TABLET | Freq: Every day | ORAL | Status: DC
  Administered 2022-01-07 – 2022-01-08 (×2): 0.1 mg via ORAL
  Filled 2022-01-07 (×2): qty 1

## 2022-01-07 MED ORDER — LURASIDONE HCL 40 MG PO TABS
40.0000 mg | ORAL_TABLET | Freq: Every day | ORAL | Status: DC
  Administered 2022-01-07 – 2022-01-08 (×2): 40 mg via ORAL
  Filled 2022-01-07 (×2): qty 1

## 2022-01-07 MED ORDER — VITAMIN D (ERGOCALCIFEROL) 1.25 MG (50000 UNIT) PO CAPS
50000.0000 [IU] | ORAL_CAPSULE | ORAL | Status: DC
  Administered 2022-01-07 – 2022-01-08 (×2): 50000 [IU] via ORAL
  Filled 2022-01-07 (×2): qty 1

## 2022-01-07 MED ORDER — PANTOPRAZOLE SODIUM 40 MG PO TBEC: 40.0000 mg | DELAYED_RELEASE_TABLET | Freq: Every day | ORAL | Status: DC

## 2022-01-07 MED ORDER — MIRTAZAPINE 15 MG PO TABS: 15.0000 mg | ORAL_TABLET | Freq: Every day | ORAL | Status: DC

## 2022-01-07 MED ORDER — MELATONIN 3 MG PO TABS
3.0000 mg | ORAL_TABLET | Freq: Every day | ORAL | Status: DC
  Administered 2022-01-07: 3 mg via ORAL
  Filled 2022-01-07: qty 1

## 2022-01-07 MED ORDER — DULOXETINE HCL 30 MG PO CPEP
30.0000 mg | ORAL_CAPSULE | Freq: Two times a day (BID) | ORAL | Status: DC
  Administered 2022-01-07 – 2022-01-08 (×3): 30 mg via ORAL
  Filled 2022-01-07 (×4): qty 1

## 2022-01-07 NOTE — ED Notes (Signed)
  1 GREY IN COLOR HOOP EARRING, 1 GREY IN COLOR STUD BALL EARRING REMOVED FROM PT AND GIVEN TO SECURITY AND LOCKED UP WITH SECURITY

## 2022-01-07 NOTE — Consult Note (Cosign Needed Addendum)
Telepsych Consultation   Reason for Consult:  psych consult Referring Physician:  Cristie Hem, MD Location of Patient:  [facility] [id number] Location of Provider: [place] Department  Patient Identification: Kathryn [name] MRN:  [id number] Principal Diagnosis: 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[place]) Diagnosis:  Principal Problem:   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[place]) Active Problems:   MDD (major depressive disorder), recurrent episode, severe ([place])   Total Time spent with patient: 20 minutes  Subjective:   Kathryn [name] is a 51 y.o. female patient admitted with suicidal ideation.  Patient presents alert and oriented to person, place; unable to state today's date. Dysphoric mood, blunt affect. Disheveled appearance; inconsistent eye contact.   "Stressed and depressed. Housing, I'm having issues with my relationships. I'm homeless and I have no where to go because I'm on a fixed income and can't afford no where to stay". States she has a felony which makes her quest for housing more complicated and depressing. Says daughters were recently homeless as well but were able to secure housing and "left mama behind". She  Reports substance use since age 20. Current drug of choice crack cocaine. Has been on 2 week binge. States auditory and visual hallucinations are "normal" during active use or "coming off of drugs". She is requesting help with "getting into rehab after I get better". She reports not taking psychotropic medications or engaging in outpatient services during most recent binge. Hyperfocused on aftercare plan given her recent cycle of treatment then "being discharged back to the same environment" she attributes as a trigger to her substance use. She denies any active auditory/visual hallucinations. She continues to endorse increased worry, poor sleep, anhedonia, feelings of guilt/shame/worthlessness, low energy, poor concentration  and appetite, with passive thoughts of death. She denies any intent or plan to harm herself or anyone however is unable to contract for safety at this time.   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 Currently unemployed, receives [employer]. Per chart review patient presented to [facility] (10/16/21) with suicidal ideations where she was admitted to [facility] (07/03-08/23), [facility] 12/12/21. Outpatient treatment noted (therapist, case management) via [facility]; most recent medication dispense date noted 09/05, 07/23 (Latuda 40 mg daily w/ breakfast, Vistaril 100 mg BID PRN, Clonidine 0.1 mg daily, Albuterol 2.5 mg q6hr PRN, Amlodipine 5 mg daily, Benzonatate 100 mg BID PRN, Omeprezole 20 mg daily, Tizanidine 4 mg q8hrs PRN). BAL<10, UDS+cocaine. PDMP reviewed, no active prescriptions noted.   Past Psychiatric History: psychiatric history of polysubstance abuse, cocaine induced mood disorder with depressive symptoms, suicidal ideations, schizoaffective disorder bipolar type. Inpatient psychiatric hospitalizations [facility] 07/03-08/23, [place] 2023. Current outpatient care [facility].    Risk to Self:   Risk to Others:   Prior Inpatient Therapy:   Prior Outpatient Therapy:    Past Medical History:  Past Medical History:  Diagnosis Date   Anxiety    Arthritis    Asthma    Bipolar disorder (HCC)    Chronic back pain    COPD (chronic obstructive pulmonary disease) (HCC)    Chronic bronchitis   Depression    Dyspnea    GERD (gastroesophageal reflux disease)    Hypertension  Migraine    Neuropathy    Osteoarthritis of left knee, patellofemoral  12/27/2017   Single subsegmental pulmonary embolism without acute cor pulmonale ([place]) 06/20/2021   Sleep apnea 04/2020   GETTING A cpap   Suicidal ideation 12/24/2018    Past Surgical History:  Procedure Laterality Date   BACK SURGERY     DILATATION AND CURETTAGE/HYSTEROSCOPY WITH MINERVA N/A 06/09/2020   Procedure: DILATATION AND CURETTAGE/HYSTEROSCOPY WITH MINERVA;  Surgeon: Florian Buff, MD;  Location: [facility] ORS;  Service: Gynecology;  Laterality: N/A;   ESOPHAGOGASTRODUODENOSCOPY (EGD) WITH PROPOFOL N/A 12/25/2018   Procedure: ESOPHAGOGASTRODUODENOSCOPY (EGD) WITH PROPOFOL;  Surgeon: Rogene Houston, MD;  Location: [facility]ITE;  Service: Endoscopy;  Laterality: N/A;   FLEXIBLE SIGMOIDOSCOPY  10/14/2021   Procedure: FLEXIBLE SIGMOIDOSCOPY;  Surgeon: Harvel Quale, MD;  Location: [facility] SUITE;  Service: Gastroenterology;;   PATELLA-FEMORAL ARTHROPLASTY Left 10/08/2018   Procedure: PATELLA-FEMORAL ARTHROPLASTY;  Surgeon: Marchia Bond, MD;  Location: [facility];  Service: Orthopedics;  Laterality: Left;   TUBAL LIGATION     Family History:  Family History  Problem Relation Age of Onset   Gout Paternal Grandfather    Cirrhosis Paternal Grandfather    Hypertension Paternal Grandmother    Aneurysm Paternal Grandmother    Cirrhosis Maternal Grandmother    Cirrhosis Maternal Grandfather    Cancer Father    Cirrhosis Father    Cirrhosis Mother    Breast cancer Sister    Hypertension Sister    Bronchitis Daughter    Bronchitis Daughter    Asthma Son    Bronchitis Son    Migraines Neg Hx    Family Psychiatric  History: not noted Social History:  Social History   Substance and Sexual Activity  Alcohol Use Not Currently     Social History   Substance and Sexual Activity  Drug Use Not Currently   Types: Cocaine   Comment: crack  last used June 2023    Social History   Socioeconomic History   Marital status: Single    Spouse name: Not on file   Number of  children: Not on file   Years of education: Not on file   Highest education level: Not on file  Occupational History   Not on file  Tobacco Use   Smoking status: Former    Packs/day: 3.00    Years: 5.00    Total pack years: 15.00    Types: Cigarettes    Passive exposure: Current   Smokeless tobacco: Never   Tobacco comments:    Occasional smoker  Vaping Use   Vaping Use: Never used  Substance and Sexual Activity   Alcohol use: Not Currently   Drug use: Not Currently    Types: Cocaine    Comment: crack  last used June 2023   Sexual activity: Yes    Birth control/protection: Surgical    Comment: tubal, ablation  Other Topics Concern   Not on file  Social History Narrative   R handed    Lives with boyfriend   1 Cup of caffeine daily    Social Determinants of Health   Financial Resource Strain: Not on file  Food Insecurity: No Food Insecurity (03/07/2021)   Hunger Vital Sign    Worried About Running Out of Food in the Last Year: Never true    Ran Out of Food in the Last Year: Never true  Transportation Needs: No Transportation Needs (03/07/2021)   PRAPARE - Transportation    Lack of  Transportation (Medical): No    Lack of Transportation (Non-Medical): No  Physical Activity: Not on file  Stress: Not on file  Social Connections: Not on file   Additional Social History:    Allergies:   Allergies  Allergen Reactions   Clonopin [Clonazepam] Anaphylaxis    Swelling of the tongue and mouth.    Fish Allergy Anaphylaxis, Shortness Of Breath and Swelling   Flexeril [Cyclobenzaprine Hcl] Shortness Of Breath   Ibuprofen Anaphylaxis, Hives and Other (See Comments)   Shellfish Allergy Anaphylaxis   Tylenol [Acetaminophen] Anaphylaxis   Ace Inhibitors Cough   Peanut Allergen Powder-Dnfp    Tramadol Nausea And Vomiting    Upset stomach   Trazodone And Nefazodone Hives    Labs:  Results for orders placed or performed during the hospital encounter of 01/06/22 (from the  past 48 hour(s))  Urine rapid drug screen (hosp performed)     Status: Abnormal   Collection Time: 01/06/22  2:30 PM  Result Value Ref Range   Opiates NONE DETECTED NONE DETECTED   Cocaine POSITIVE (A) NONE DETECTED   Benzodiazepines NONE DETECTED NONE DETECTED   Amphetamines NONE DETECTED NONE DETECTED   Tetrahydrocannabinol NONE DETECTED NONE DETECTED   Barbiturates NONE DETECTED NONE DETECTED    Comment: (NOTE) DRUG SCREEN FOR MEDICAL PURPOSES ONLY.  IF CONFIRMATION IS NEEDED FOR ANY PURPOSE, NOTIFY LAB WITHIN 5 DAYS.  LOWEST DETECTABLE LIMITS FOR URINE DRUG SCREEN Drug Class                     Cutoff (ng/mL) Amphetamine and metabolites    1000 Barbiturate and metabolites    200 Benzodiazepine                 443 Tricyclics and metabolites     300 Opiates and metabolites        300 Cocaine and metabolites        300 THC                            50 Performed at [facility] [place], [place] [address]   Comprehensive metabolic panel     Status: Abnormal   Collection Time: 01/06/22  2:52 PM  Result Value Ref Range   Sodium 137 135 - 145 mmol/L   Potassium 3.4 (L) 3.5 - 5.1 mmol/L   Chloride 106 98 - 111 mmol/L   CO2 21 (L) 22 - 32 mmol/L   Glucose, Bld 83 70 - 99 mg/dL    Comment: Glucose reference range applies only to samples taken after fasting for at least 8 hours.   BUN 7 6 - 20 mg/dL   Creatinine, Ser 0.67 0.44 - 1.00 mg/dL   Calcium 8.8 (L) 8.9 - 10.3 mg/dL   Total Protein 7.2 6.5 - 8.1 g/dL   Albumin 3.5 3.5 - 5.0 g/dL   AST 18 15 - 41 U/L   ALT 19 0 - 44 U/L   Alkaline Phosphatase 74 38 - 126 U/L   Total Bilirubin 0.6 0.3 - 1.2 mg/dL   GFR, Estimated >60 >60 mL/min    Comment: (NOTE) Calculated using the CKD-EPI Creatinine Equation (2021)    Anion gap 10 5 - 15    Comment: Performed at [facility], [address], [place], [place] [address]  Ethanol     Status: None   Collection Time: 01/06/22  2:52 PM  Result Value Ref Range  Alcohol, Ethyl (B) <10 <10 mg/dL    Comment: (NOTE) Lowest detectable limit for serum alcohol is 10 mg/dL.  For medical purposes only. Performed at [facility], [address], [place], [place] [address]   CBC with Diff     Status: Abnormal   Collection Time: 01/06/22  2:52 PM  Result Value Ref Range   WBC 6.4 4.0 - 10.5 K/uL   RBC 4.48 3.87 - 5.11 MIL/uL   Hemoglobin 12.7 12.0 - 15.0 g/dL   HCT 38.6 36.0 - 46.0 %   MCV 86.2 80.0 - 100.0 fL   MCH 28.3 26.0 - 34.0 pg   MCHC 32.9 30.0 - 36.0 g/dL   RDW 17.2 (H) 11.5 - 15.5 %   Platelets 651 (H) 150 - 400 K/uL   nRBC 0.0 0.0 - 0.2 %   Neutrophils Relative % 62 %   Neutro Abs 4.0 1.7 - 7.7 K/uL   Lymphocytes Relative 29 %   Lymphs Abs 1.8 0.7 - 4.0 K/uL   Monocytes Relative 6 %   Monocytes Absolute 0.4 0.1 - 1.0 K/uL   Eosinophils Relative 2 %   Eosinophils Absolute 0.1 0.0 - 0.5 K/uL   Basophils Relative 1 %   Basophils Absolute 0.0 0.0 - 0.1 K/uL   Immature Granulocytes 0 %   Abs Immature Granulocytes 0.02 0.00 - 0.07 K/uL    Comment: Performed at [facility] [place], [address] [place], [place] [address]  Acetaminophen level     Status: Abnormal   Collection Time: 01/06/22  2:52 PM  Result Value Ref Range   Acetaminophen (Tylenol), Serum <10 (L) 10 - 30 ug/mL    Comment: (NOTE) Therapeutic concentrations vary significantly. A range of 10-30 ug/mL  may be an effective concentration for many patients. However, some  are best treated at concentrations outside of this range. Acetaminophen concentrations >150 ug/mL at 4 hours after ingestion  and >50 ug/mL at 12 hours after ingestion are often associated with  toxic reactions.  Performed at [facility], [address] [place], [place] [address]   Salicylate level     Status: Abnormal   Collection Time: 01/06/22  2:52 PM  Result Value Ref Range   Salicylate Lvl <4.7 (L) 7.0 - 30.0 mg/dL    Comment: Performed at [facility], [address] [place], [place] [address]  POC  urine preg, ED     Status: None   Collection Time: 01/06/22  3:45 PM  Result Value Ref Range   Preg Test, Ur NEGATIVE NEGATIVE    Comment:        THE SENSITIVITY OF THIS METHODOLOGY IS >24 mIU/mL   Resp Panel by RT-PCR (Flu A&B, Covid) Anterior Nasal Swab     Status: None   Collection Time: 01/06/22  4:50 PM   Specimen: Anterior Nasal Swab  Result Value Ref Range   SARS Coronavirus 2 by RT PCR NEGATIVE NEGATIVE    Comment: (NOTE) SARS-CoV-2 target nucleic acids are NOT DETECTED.  The SARS-CoV-2 RNA is generally detectable in upper respiratory specimens during the acute phase of infection. The lowest concentration of SARS-CoV-2 viral copies this assay can detect is 138 copies/mL. A negative result does not preclude SARS-Cov-2 infection and should not be used as the sole basis for treatment or other patient management decisions. A negative result may occur with  improper specimen collection/handling, submission of specimen other than nasopharyngeal swab, presence of viral mutation(s) within the areas targeted by this assay, and inadequate number of viral  copies(<138 copies/mL). A negative result must be combined with clinical observations, patient history, and epidemiological information. The expected result is Negative.  Fact Sheet for Patients:  EntrepreneurPulse.com.au  Fact Sheet for Healthcare Providers:  IncredibleEmployment.be  This test is no t yet approved or cleared by the [place] FDA and  has been authorized for detection and/or diagnosis of SARS-CoV-2 by FDA under an Emergency Use Authorization (EUA). This EUA will remain  in effect (meaning this test can be used) for the duration of the COVID-19 declaration under Section 564(b)(1) of the Act, 21 U.S.C.section 360bbb-3(b)(1), unless the authorization is terminated  or revoked sooner.       Influenza A by PCR NEGATIVE NEGATIVE   Influenza B by PCR NEGATIVE NEGATIVE     Comment: (NOTE) The Xpert Xpress SARS-CoV-2/FLU/RSV plus assay is intended as an aid in the diagnosis of influenza from Nasopharyngeal swab specimens and should not be used as a sole basis for treatment. Nasal washings and aspirates are unacceptable for Xpert Xpress SARS-CoV-2/FLU/RSV testing.  Fact Sheet for Patients: EntrepreneurPulse.com.au  Fact Sheet for Healthcare Providers: IncredibleEmployment.be  This test is not yet approved or cleared by the [place] FDA and has been authorized for detection and/or diagnosis of SARS-CoV-2 by FDA under an Emergency Use Authorization (EUA). This EUA will remain in effect (meaning this test can be used) for the duration of the COVID-19 declaration under Section 564(b)(1) of the Act, 21 U.S.C. section 360bbb-3(b)(1), unless the authorization is terminated or revoked.  Performed at [facility], [address] [place], [place] [address]     Medications:  Current Facility-Administered Medications  Medication Dose Route Frequency Provider Last Rate Last Admin   amLODipine (NORVASC) tablet 5 mg  5 mg Oral Daily Cristie Hem, MD   5 mg at 01/06/22 1727   apixaban (ELIQUIS) tablet 5 mg  5 mg Oral BID Cristie Hem, MD   5 mg at 01/06/22 2131   benztropine (COGENTIN) tablet 0.5 mg  0.5 mg Oral BID PRN Cristie Hem, MD       cloNIDine (CATAPRES) tablet 0.1 mg  0.1 mg Oral Daily Leevy-Johnson, Letica Giaimo A, NP       cyanocobalamin (VITAMIN B12) tablet 1,000 mcg  1,000 mcg Oral Daily Leevy-Johnson, Tolbert Matheson A, NP       DULoxetine (CYMBALTA) DR capsule 30 mg  30 mg Oral BID Leevy-Johnson, Daionna Crossland A, NP       furosemide (LASIX) tablet 20 mg  20 mg Oral Daily Cristie Hem, MD   20 mg at 01/06/22 1726   hydrOXYzine (ATARAX) tablet 100 mg  100 mg Oral BID Leevy-Johnson, Hanalei Glace A, NP       lurasidone (LATUDA) tablet 40 mg  40 mg Oral Q breakfast Leevy-Johnson, Sandy Blouch A, NP       melatonin tablet  3 mg  3 mg Oral QHS Leevy-Johnson, Mantaj Chamberlin A, NP       mirtazapine (REMERON) tablet 15 mg  15 mg Oral QHS Leevy-Johnson, Arvine Clayburn A, NP       mometasone-formoterol (DULERA) 200-5 MCG/ACT inhaler 2 puff  2 puff Inhalation BID Cristie Hem, MD   2 puff at 01/07/22 0929   pantoprazole (PROTONIX) EC tablet 40 mg  40 mg Oral Daily Cristie Hem, MD   40 mg at 01/06/22 1727   potassium chloride SA (KLOR-CON M) CR tablet 20 mEq  20 mEq Oral Daily Cristie Hem, MD   20 mEq at 01/06/22 1727   Vitamin  D (Ergocalciferol) (DRISDOL) 1.25 MG (50000 UNIT) capsule 50,000 Units  50,000 Units Oral Q7 days Leevy-Johnson, Kimberlly Norgard A, NP       Current Outpatient Medications  Medication Sig Dispense Refill   albuterol (PROVENTIL) (2.5 MG/3ML) 0.083% nebulizer solution Take 2.5 mg by nebulization every 6 (six) hours as needed for shortness of breath or wheezing.     albuterol (VENTOLIN HFA) 108 (90 Base) MCG/ACT inhaler Inhale into the lungs.     amLODipine (NORVASC) 5 MG tablet Take 1 tablet (5 mg total) by mouth daily. 30 tablet 0   anagrelide (AGRYLIN) 1 MG capsule Take 1 capsule (1 mg total) by mouth 2 (two) times daily. 60 capsule 0   benzonatate (TESSALON) 100 MG capsule Take 1 capsule (100 mg total) by mouth 2 (two) times daily as needed for cough. 20 capsule 0   benztropine (COGENTIN) 0.5 MG tablet Take 1 tablet (0.5 mg total) by mouth 2 (two) times daily as needed for tremors (EPS/dystonia). 30 tablet 0   citalopram (CELEXA) 20 MG tablet Take 1 tablet (20 mg total) by mouth daily. 30 tablet 0   cloNIDine (CATAPRES) 0.1 MG tablet Take 0.1 mg by mouth daily.     DULoxetine (CYMBALTA) 30 MG capsule Take 30 mg by mouth 2 (two) times daily.     fluticasone furoate-vilanterol (BREO ELLIPTA) 200-25 MCG/ACT AEPB Inhale 1 puff into the lungs daily. 60 each 2   furosemide (LASIX) 20 MG tablet Take 1 tablet (20 mg total) by mouth daily. 30 tablet 10   hydrOXYzine (VISTARIL) 50 MG capsule Take 100 mg by  mouth 2 (two) times daily.     lubiprostone (AMITIZA) 24 MCG capsule Take 1 capsule (24 mcg total) by mouth 2 (two) times daily with a meal. 60 capsule 0   lurasidone (LATUDA) 40 MG TABS tablet Take 40 mg by mouth daily with breakfast.     melatonin 3 MG TABS tablet Take 1 tablet (3 mg total) by mouth at bedtime. 30 tablet 0   methocarbamol (ROBAXIN) 500 MG tablet Take 500 mg by mouth 2 (two) times daily.     mirtazapine (REMERON) 15 MG tablet Take 15 mg by mouth at bedtime.     mometasone-formoterol (DULERA) 200-5 MCG/ACT AERO Inhale 2 puffs into the lungs 2 (two) times daily. 1 each 0   omeprazole (PRILOSEC) 20 MG capsule Take 20 mg by mouth daily.     orphenadrine (NORFLEX) 100 MG tablet Take 100 mg by mouth 2 (two) times daily as needed.     oxyCODONE (ROXICODONE) 5 MG immediate release tablet Take 1 tablet (5 mg total) by mouth every 6 (six) hours as needed for severe pain. 15 tablet 0   pantoprazole (PROTONIX) 40 MG tablet Take 1 tablet (40 mg total) by mouth daily. 30 tablet 0   potassium chloride SA (KLOR-CON M) 20 MEQ tablet Take 1 tablet (20 mEq total) by mouth daily. TAKE (1) TABLET BY MOUTH ONCE A DAY. Strength: 20 mEq 30 tablet 0   pregabalin (LYRICA) 150 MG capsule Take 1 capsule (150 mg total) by mouth 2 (two) times daily. 60 capsule 5   tiZANidine (ZANAFLEX) 4 MG tablet Take 1 tablet (4 mg total) by mouth every 8 (eight) hours as needed for muscle spasms. 30 tablet 0   traZODone (DESYREL) 100 MG tablet Take 100 mg by mouth at bedtime.     vitamin B-12 1000 MCG tablet Take 1 tablet (1,000 mcg total) by mouth daily. 30 tablet 0  Vitamin D, Ergocalciferol, (DRISDOL) 1.25 MG (50000 UNIT) CAPS capsule Take 1 capsule (50,000 Units total) by mouth every 7 (seven) days. 5 capsule 0   risperiDONE (RISPERDAL) 0.5 MG tablet Take 1 tablet (0.5 mg total) by mouth daily. (Patient not taking: Reported on 12/20/2021) 30 tablet 0   risperiDONE (RISPERDAL) 1 MG tablet Take 1 tablet (1 mg total) by  mouth at bedtime. (Patient not taking: Reported on 12/20/2021) 30 tablet 0    Musculoskeletal: Strength & Muscle Tone: within normal limits Gait & Station: normal Patient leans: N/A  Psychiatric Specialty Exam:  Presentation  General Appearance: Appropriate for Environment; Fairly Groomed  Eye Contact:Fair  Speech:Clear and Coherent  Speech Volume:Normal  Handedness:Right   Mood and Affect  Mood:Euthymic  Affect:Congruent   Thought Process  Thought Processes:Coherent  Descriptions of Associations:Intact  Orientation:Full (Time, Place and Person)  Thought Content:Logical  History of Schizophrenia/Schizoaffective disorder:Yes  Duration of Psychotic Symptoms:N/A  Hallucinations:No data recorded Ideas of Reference:None  Suicidal Thoughts:No data recorded Homicidal Thoughts:No data recorded  Sensorium  Memory:Immediate Good  Judgment:Good  Insight:Good   Executive Functions  Concentration:Good  Attention Span:Good  [place] of Knowledge:Good  Language:Good   Psychomotor Activity  Psychomotor Activity:No data recorded  Assets  Assets:Communication Skills   Sleep  Sleep:No data recorded   Physical Exam: Physical Exam Vitals and nursing note reviewed.  Constitutional:      Appearance: She is obese.  HENT:     Head: Normocephalic.     Nose: Nose normal.     Mouth/Throat:     Mouth: Mucous membranes are moist.     Pharynx: Oropharynx is clear.  Eyes:     Pupils: Pupils are equal, round, and reactive to light.  Cardiovascular:     Rate and Rhythm: Normal rate.     Pulses: Normal pulses.  Pulmonary:     Effort: Pulmonary effort is normal.  Abdominal:     Palpations: Abdomen is soft.  Musculoskeletal:        General: Normal range of motion.     Cervical back: Normal range of motion.  Skin:    General: Skin is warm.  Neurological:     Mental Status: She is alert and oriented to person, place, and time.  Psychiatric:         Attention and Perception: She is attentive. She does not perceive auditory hallucinations.        Mood and Affect: Mood is depressed. Affect is blunt and tearful.        Speech: Speech normal.        Behavior: Behavior is cooperative.        Thought Content: Thought content does not include homicidal ideation. Thought content does not include homicidal plan.        Cognition and Memory: Cognition and memory normal.        Judgment: Judgment is impulsive.   Review of Systems  Psychiatric/Behavioral:  Positive for depression and substance abuse.   All other systems reviewed and are negative.  Blood pressure 102/60, pulse 79, temperature 99.1 F (37.3 C), temperature source Oral, resp. rate 18, height '4\' 11"'$  (1.499 m), weight 74.8 kg, last menstrual period 06/07/2020, SpO2 98 %. Body mass index is 33.33 kg/m.  Treatment Plan Summary: Daily contact with patient to assess and evaluate symptoms and progress in treatment, Medication management, and Plan restart psychotropic medications, seek inpatient hospitalization Restart Medications:   -Latuda 40 mg daily w/ breakfast -Vistaril 100 mg BID PRN  -  Clonidine 0.1 mg daily -Duloxetine DR 30 mg daily  -Care discussed with attending physician Cinderella, MD. Current plan is to continue to seek inpatient placement, monitor patient while in ED. Psychiatry will re-evaluate patient for possible remission of symptoms, ability to contract for safety and possible [facility] placement.  Disposition: Recommend psychiatric Inpatient admission when medically cleared. Supportive therapy provided about ongoing stressors. Discussed crisis plan, support from social network, calling 911, coming to the Emergency Department, and calling Suicide Hotline.  This service was provided via telemedicine using a 2-way, interactive audio and video technology.  Names of all persons participating in this telemedicine service and their role in this encounter. Name: Oneida Alar Role: PMHNP  Name: Joycelyn Schmid Cinderella Role: Attending MD  Name: Kathryn [name] Role: patient  Name:  Role:     Inda Merlin, NP 01/07/2022 9:50 AM

## 2022-01-08 ENCOUNTER — Encounter (HOSPITAL_COMMUNITY): Payer: Self-pay | Admitting: Student

## 2022-01-08 ENCOUNTER — Inpatient Hospital Stay (HOSPITAL_COMMUNITY)
Admission: AD | Admit: 2022-01-08 | Discharge: 2022-01-15 | DRG: 885 | Disposition: A | Discharge status: Home / Self Care | Payer: Medicare HMO | Source: Intra-hospital | Attending: Psychiatry | Admitting: Psychiatry

## 2022-01-08 ENCOUNTER — Other Ambulatory Visit: Payer: Self-pay | Admitting: Psychiatry

## 2022-01-08 ENCOUNTER — Other Ambulatory Visit: Payer: Self-pay

## 2022-01-08 DIAGNOSIS — G8929 Other chronic pain: Secondary | ICD-10-CM | POA: Diagnosis present

## 2022-01-08 DIAGNOSIS — D473 Essential (hemorrhagic) thrombocythemia: Secondary | ICD-10-CM | POA: Diagnosis present

## 2022-01-08 DIAGNOSIS — M4310 Spondylolisthesis, site unspecified: Secondary | ICD-10-CM | POA: Diagnosis present

## 2022-01-08 DIAGNOSIS — F1494 Cocaine use, unspecified with cocaine-induced mood disorder: Secondary | ICD-10-CM | POA: Diagnosis not present

## 2022-01-08 DIAGNOSIS — K5900 Constipation, unspecified: Secondary | ICD-10-CM | POA: Diagnosis present

## 2022-01-08 DIAGNOSIS — F419 Anxiety disorder, unspecified: Secondary | ICD-10-CM | POA: Diagnosis present

## 2022-01-08 DIAGNOSIS — R45851 Suicidal ideations: Secondary | ICD-10-CM | POA: Diagnosis present

## 2022-01-08 DIAGNOSIS — F332 Major depressive disorder, recurrent severe without psychotic features: Secondary | ICD-10-CM | POA: Diagnosis present

## 2022-01-08 DIAGNOSIS — I10 Essential (primary) hypertension: Secondary | ICD-10-CM | POA: Diagnosis present

## 2022-01-08 DIAGNOSIS — F19959 Other psychoactive substance use, unspecified with psychoactive substance-induced psychotic disorder, unspecified: Secondary | ICD-10-CM | POA: Diagnosis present

## 2022-01-08 DIAGNOSIS — Z79899 Other long term (current) drug therapy: Secondary | ICD-10-CM | POA: Diagnosis not present

## 2022-01-08 DIAGNOSIS — F1520 Other stimulant dependence, uncomplicated: Secondary | ICD-10-CM | POA: Diagnosis present

## 2022-01-08 DIAGNOSIS — Z5900 Homelessness unspecified: Secondary | ICD-10-CM | POA: Diagnosis not present

## 2022-01-08 DIAGNOSIS — F1994 Other psychoactive substance use, unspecified with psychoactive substance-induced mood disorder: Secondary | ICD-10-CM | POA: Diagnosis not present

## 2022-01-08 DIAGNOSIS — F17210 Nicotine dependence, cigarettes, uncomplicated: Secondary | ICD-10-CM | POA: Diagnosis present

## 2022-01-08 DIAGNOSIS — G4700 Insomnia, unspecified: Secondary | ICD-10-CM | POA: Diagnosis present

## 2022-01-08 DIAGNOSIS — Z23 Encounter for immunization: Secondary | ICD-10-CM | POA: Diagnosis present

## 2022-01-08 DIAGNOSIS — J4489 Other specified chronic obstructive pulmonary disease: Secondary | ICD-10-CM | POA: Diagnosis present

## 2022-01-08 DIAGNOSIS — K30 Functional dyspepsia: Secondary | ICD-10-CM | POA: Diagnosis present

## 2022-01-08 DIAGNOSIS — Z981 Arthrodesis status: Secondary | ICD-10-CM

## 2022-01-08 DIAGNOSIS — F250 Schizoaffective disorder, bipolar type: Secondary | ICD-10-CM

## 2022-01-08 DIAGNOSIS — F1410 Cocaine abuse, uncomplicated: Secondary | ICD-10-CM

## 2022-01-08 MED ORDER — LURASIDONE HCL 40 MG PO TABS
40.0000 mg | ORAL_TABLET | Freq: Every day | ORAL | Status: DC
  Administered 2022-01-09 – 2022-01-15 (×7): 40 mg via ORAL
  Filled 2022-01-08 (×9): qty 1

## 2022-01-08 MED ORDER — ANAGRELIDE HCL 0.5 MG PO CAPS
1.0000 mg | ORAL_CAPSULE | Freq: Two times a day (BID) | ORAL | Status: DC
  Administered 2022-01-09 – 2022-01-15 (×13): 1 mg via ORAL
  Filled 2022-01-08 (×16): qty 2

## 2022-01-08 MED ORDER — POTASSIUM CHLORIDE CRYS ER 20 MEQ PO TBCR
20.0000 meq | EXTENDED_RELEASE_TABLET | Freq: Every day | ORAL | Status: DC
  Administered 2022-01-09: 20 meq via ORAL
  Filled 2022-01-08 (×3): qty 1

## 2022-01-08 MED ORDER — MOMETASONE FURO-FORMOTEROL FUM 100-5 MCG/ACT IN AERO
2.0000 | INHALATION_SPRAY | Freq: Two times a day (BID) | RESPIRATORY_TRACT | Status: DC
  Administered 2022-01-09 – 2022-01-15 (×13): 2 via RESPIRATORY_TRACT
  Filled 2022-01-08 (×3): qty 8.8

## 2022-01-08 MED ORDER — DULOXETINE HCL 30 MG PO CPEP
30.0000 mg | ORAL_CAPSULE | Freq: Every day | ORAL | Status: DC
  Administered 2022-01-09: 30 mg via ORAL
  Filled 2022-01-08 (×3): qty 1

## 2022-01-08 MED ORDER — NICOTINE 21 MG/24HR TD PT24
21.0000 mg | MEDICATED_PATCH | Freq: Every day | TRANSDERMAL | Status: DC
  Administered 2022-01-09 – 2022-01-15 (×7): 21 mg via TRANSDERMAL
  Filled 2022-01-08 (×11): qty 1

## 2022-01-08 MED ORDER — MELATONIN 5 MG PO TABS
5.0000 mg | ORAL_TABLET | Freq: Every evening | ORAL | Status: DC | PRN
  Administered 2022-01-09 – 2022-01-14 (×6): 5 mg via ORAL
  Filled 2022-01-08 (×7): qty 1

## 2022-01-08 MED ORDER — HYDROXYZINE HCL 50 MG PO TABS
50.0000 mg | ORAL_TABLET | Freq: Four times a day (QID) | ORAL | Status: DC | PRN
  Administered 2022-01-09 – 2022-01-15 (×10): 50 mg via ORAL
  Filled 2022-01-08 (×11): qty 1

## 2022-01-08 MED ORDER — ALBUTEROL SULFATE HFA 108 (90 BASE) MCG/ACT IN AERS
1.0000 | INHALATION_SPRAY | Freq: Four times a day (QID) | RESPIRATORY_TRACT | Status: DC | PRN
  Administered 2022-01-12 – 2022-01-14 (×2): 1 via RESPIRATORY_TRACT
  Filled 2022-01-08: qty 6.7

## 2022-01-08 MED ORDER — FUROSEMIDE 20 MG PO TABS
20.0000 mg | ORAL_TABLET | Freq: Every day | ORAL | Status: DC
  Administered 2022-01-09: 20 mg via ORAL
  Filled 2022-01-08 (×3): qty 1

## 2022-01-08 MED ORDER — INFLUENZA VAC SPLIT QUAD 0.5 ML IM SUSY
0.5000 mL | PREFILLED_SYRINGE | INTRAMUSCULAR | Status: AC
  Administered 2022-01-09: 0.5 mL via INTRAMUSCULAR
  Filled 2022-01-08: qty 0.5

## 2022-01-08 MED ORDER — ALUM & MAG HYDROXIDE-SIMETH 200-200-20 MG/5ML PO SUSP
30.0000 mL | ORAL | Status: DC | PRN
  Administered 2022-01-11 (×2): 30 mL via ORAL
  Filled 2022-01-08 (×3): qty 30

## 2022-01-08 MED ORDER — CLONIDINE HCL 0.1 MG PO TABS
0.1000 mg | ORAL_TABLET | Freq: Every day | ORAL | Status: DC
  Administered 2022-01-09: 0.1 mg via ORAL
  Filled 2022-01-08 (×3): qty 1

## 2022-01-08 MED ORDER — PNEUMOCOCCAL 20-VAL CONJ VACC 0.5 ML IM SUSY
0.5000 mL | PREFILLED_SYRINGE | INTRAMUSCULAR | Status: AC
  Administered 2022-01-09: 0.5 mL via INTRAMUSCULAR
  Filled 2022-01-08: qty 0.5

## 2022-01-08 MED ORDER — MAGNESIUM HYDROXIDE 400 MG/5ML PO SUSP
30.0000 mL | Freq: Every day | ORAL | Status: DC | PRN
  Administered 2022-01-11 – 2022-01-14 (×2): 30 mL via ORAL
  Filled 2022-01-08 (×2): qty 30

## 2022-01-08 MED ORDER — AMLODIPINE BESYLATE 5 MG PO TABS
5.0000 mg | ORAL_TABLET | Freq: Every day | ORAL | Status: DC
  Administered 2022-01-09 – 2022-01-15 (×7): 5 mg via ORAL
  Filled 2022-01-08 (×13): qty 1

## 2022-01-08 NOTE — ED Notes (Signed)
Patient admitted to [facility] Accepting: Dr. Winfred Leeds Room 303-1  Telephone report given to [facility] transport contacted

## 2022-01-08 NOTE — ED Provider Notes (Addendum)
  Physical Exam  BP 125/70 (BP Location: Left Arm)   Pulse 71   Temp 99 F (37.2 C) (Oral)   Resp 20   Ht '4\' 11"'$  (1.499 m)   Wt 74.8 kg   LMP 06/07/2020   SpO2 98%   BMI 33.33 kg/m   Physical Exam  Procedures  Procedures  ED Course / MDM   Clinical Course as of 01/08/22 0734  Fri Jan 06, 2022  1604 Patient medically cleared.  Pending COVID test.  Will be boarding in the emergency department. [WS]    Clinical Course User Index [WS] Cristie Hem, MD   Medical Decision Making Amount and/or Complexity of Data Reviewed Labs: ordered.  Risk Prescription drug management.   Substance induced mood disorder along with depression.  Reviewed psychiatry notes.  Plan for inpatient treatment but reevaluate for remission of symptoms  Patient has been accepted at behavioral [facility].  Reevaluated and seems stable for discharge       [name], MD 01/08/22 4497    [name], MD 01/08/22 1243

## 2022-01-08 NOTE — Consult Note (Signed)
Telepsych Consultation   Reason for Consult:  psych consult Referring Physician:  Cristie Hem, MD Location of Patient:  [facility] [phone] Location of Provider: [place] Department  Patient Identification: KINISHA SOPER MRN:  [id number] Principal Diagnosis: 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[place]) Diagnosis:  Principal Problem:   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[place]) Active Problems:   MDD (major depressive disorder), recurrent episode, severe ([place])   Suicidal ideation   Total Time spent with patient: 20 minutes  Subjective:   LAQUASIA PINCUS is a 51 y.o. female patient admitted with suicidal ideation.  On evaluation today, patient presents laying in bed; alert and oriented, calm and cooperative. Depressed mood, blunt affect. She reports "good" sleep overnight and no changes in appetite. She endorses "feeling a lot better"; however continues to report feelings of depression with some hopelessness and worthlessness. She denies any auditory or visual hallucinations. No indications of responding to any external/internal stimuli. She denies any active plan or intent for suicide/homicide, however is unable to contract for safety. She continues to express interest in treatment for her mental health and substance abuse.   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 Currently unemployed, receives [employer]. Per chart review patient presented to [facility] [place] (10/16/21) with suicidal ideations where she was admitted to [facility] (07/03-08/23), [facility] 12/12/21. Outpatient treatment noted (therapist, case management) via  [facility]; most recent medication dispense date noted 09/05, 07/23 (Latuda 40 mg daily w/ breakfast, Vistaril 100 mg BID PRN, Clonidine 0.1 mg daily, Albuterol 2.5 mg q6hr PRN, Amlodipine 5 mg daily, Benzonatate 100 mg BID PRN, Omeprezole 20 mg daily, Tizanidine 4 mg q8hrs PRN). BAL<10, UDS+cocaine. PDMP reviewed, no active prescriptions noted.   Past Psychiatric History: psychiatric history of polysubstance abuse, cocaine induced mood disorder with depressive symptoms, suicidal ideations, schizoaffective disorder bipolar type. Inpatient psychiatric hospitalizations [facility] 07/03-08/23, [place] 2023. Current outpatient care via [facility].  Risk to Self:   Risk to Others:   Prior Inpatient Therapy:   Prior Outpatient Therapy:    Past Medical History:  Past Medical History:  Diagnosis Date   Anxiety    Arthritis    Asthma    Bipolar disorder (HCC)    Chronic back pain    COPD (chronic obstructive pulmonary disease) (HCC)    Chronic bronchitis   Depression    Dyspnea    GERD (gastroesophageal reflux disease)    Hypertension    Migraine    Neuropathy    Osteoarthritis of left knee, 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   Single subsegmental pulmonary embolism without acute cor pulmonale ([place]) 06/20/2021   Sleep apnea 04/2020   GETTING A cpap   Suicidal ideation 12/24/2018    Past Surgical History:  Procedure Laterality Date   BACK SURGERY     DILATATION AND CURETTAGE/HYSTEROSCOPY WITH MINERVA N/A 06/09/2020   Procedure: DILATATION AND CURETTAGE/HYSTEROSCOPY WITH MINERVA;  Surgeon: Florian Buff, MD;  Location: [facility];  Service: Gynecology;  Laterality: N/A;   ESOPHAGOGASTRODUODENOSCOPY (EGD) WITH PROPOFOL N/A 12/25/2018   Procedure: ESOPHAGOGASTRODUODENOSCOPY (EGD) WITH PROPOFOL;  Surgeon: Rogene Houston, MD;  Location: [facility];  Service: Endoscopy;  Laterality: N/A;   FLEXIBLE SIGMOIDOSCOPY  10/14/2021  Procedure: FLEXIBLE SIGMOIDOSCOPY;  Surgeon: Montez Morita, Quillian Quince, MD;  Location: [facility];  Service: Gastroenterology;;   PATELLA-FEMORAL ARTHROPLASTY Left 10/08/2018   Procedure: PATELLA-FEMORAL ARTHROPLASTY;  Surgeon: Marchia Bond, MD;  Location: [facility];  Service: Orthopedics;  Laterality: Left;   TUBAL LIGATION     Family History:  Family History  Problem Relation Age of Onset   Gout Paternal Grandfather    Cirrhosis Paternal Grandfather    Hypertension Paternal Grandmother    Aneurysm Paternal Grandmother    Cirrhosis Maternal Grandmother    Cirrhosis Maternal Grandfather    Cancer Father    Cirrhosis Father    Cirrhosis Mother    Breast cancer Sister    Hypertension Sister    Bronchitis Daughter    Bronchitis Daughter    Asthma Son    Bronchitis Son    Migraines Neg Hx    Family Psychiatric  History: not noted Social History:  Social History   Substance and Sexual Activity  Alcohol Use Not Currently     Social History   Substance and Sexual Activity  Drug Use Not Currently   Types: Cocaine   Comment: crack  last used June 2023    Social History   Socioeconomic History   Marital status: Single    Spouse name: Not on file   Number of children: Not on file   Years of education: Not on file   Highest education level: Not on file  Occupational History   Not on file  Tobacco Use   Smoking status: Former    Packs/day: 3.00    Years: 5.00    Total pack years: 15.00    Types: Cigarettes    Passive exposure: Current   Smokeless tobacco: Never   Tobacco comments:    Occasional smoker  Vaping Use   Vaping Use: Never used  Substance and Sexual Activity   Alcohol use: Not Currently   Drug use: Not Currently    Types: Cocaine    Comment: crack  last used June 2023   Sexual activity: Yes    Birth control/protection: Surgical    Comment: tubal, ablation  Other Topics Concern   Not on file  Social History Narrative   R handed    Lives with boyfriend   1 Cup of caffeine daily    Social  Determinants of Health   Financial Resource Strain: Not on file  Food Insecurity: No Food Insecurity (03/07/2021)   Hunger Vital Sign    Worried About Running Out of Food in the Last Year: Never true    Ran Out of Food in the Last Year: Never true  Transportation Needs: No Transportation Needs (03/07/2021)   PRAPARE - Hydrologist (Medical): No    Lack of Transportation (Non-Medical): No  Physical Activity: Not on file  Stress: Not on file  Social Connections: Not on file   Additional Social History:    Allergies:   Allergies  Allergen Reactions   Clonopin [Clonazepam] Anaphylaxis    Swelling of the tongue and mouth.    Fish Allergy Anaphylaxis, Shortness Of Breath and Swelling   Flexeril [Cyclobenzaprine Hcl] Shortness Of Breath   Ibuprofen Anaphylaxis, Hives and Other (See Comments)   Shellfish Allergy Anaphylaxis   Tylenol [Acetaminophen] Anaphylaxis   Ace Inhibitors Cough   Peanut Allergen Powder-Dnfp    Tramadol Nausea And Vomiting    Upset stomach   Trazodone And Nefazodone Hives    Labs:  Results for  orders placed or performed during the hospital encounter of 01/06/22 (from the past 48 hour(s))  Urine rapid drug screen (hosp performed)     Status: Abnormal   Collection Time: 01/06/22  2:30 PM  Result Value Ref Range   Opiates NONE DETECTED NONE DETECTED   Cocaine POSITIVE (A) NONE DETECTED   Benzodiazepines NONE DETECTED NONE DETECTED   Amphetamines NONE DETECTED NONE DETECTED   Tetrahydrocannabinol NONE DETECTED NONE DETECTED   Barbiturates NONE DETECTED NONE DETECTED    Comment: (NOTE) DRUG SCREEN FOR MEDICAL PURPOSES ONLY.  IF CONFIRMATION IS NEEDED FOR ANY PURPOSE, NOTIFY LAB WITHIN 5 DAYS.  LOWEST DETECTABLE LIMITS FOR URINE DRUG SCREEN Drug Class                     Cutoff (ng/mL) Amphetamine and metabolites    1000 Barbiturate and metabolites    200 Benzodiazepine                 256 Tricyclics and metabolites      300 Opiates and metabolites        300 Cocaine and metabolites        300 THC                            50 Performed at [facility] [place], [place] [address]   Comprehensive metabolic panel     Status: Abnormal   Collection Time: 01/06/22  2:52 PM  Result Value Ref Range   Sodium 137 135 - 145 mmol/L   Potassium 3.4 (L) 3.5 - 5.1 mmol/L   Chloride 106 98 - 111 mmol/L   CO2 21 (L) 22 - 32 mmol/L   Glucose, Bld 83 70 - 99 mg/dL    Comment: Glucose reference range applies only to samples taken after fasting for at least 8 hours.   BUN 7 6 - 20 mg/dL   Creatinine, Ser 0.67 0.44 - 1.00 mg/dL   Calcium 8.8 (L) 8.9 - 10.3 mg/dL   Total Protein 7.2 6.5 - 8.1 g/dL   Albumin 3.5 3.5 - 5.0 g/dL   AST 18 15 - 41 U/L   ALT 19 0 - 44 U/L   Alkaline Phosphatase 74 38 - 126 U/L   Total Bilirubin 0.6 0.3 - 1.2 mg/dL   GFR, Estimated >60 >60 mL/min    Comment: (NOTE) Calculated using the CKD-EPI Creatinine Equation (2021)    Anion gap 10 5 - 15    Comment: Performed at [facility], [address], [place], [place] [address]  Ethanol     Status: None   Collection Time: 01/06/22  2:52 PM  Result Value Ref Range   Alcohol, Ethyl (B) <10 <10 mg/dL    Comment: (NOTE) Lowest detectable limit for serum alcohol is 10 mg/dL.  For medical purposes only. Performed at [facility], [address], [place], [place] [address]   CBC with Diff     Status: Abnormal   Collection Time: 01/06/22  2:52 PM  Result Value Ref Range   WBC 6.4 4.0 - 10.5 K/uL   RBC 4.48 3.87 - 5.11 MIL/uL   Hemoglobin 12.7 12.0 - 15.0 g/dL   HCT 38.6 36.0 - 46.0 %   MCV 86.2 80.0 - 100.0 fL   MCH 28.3 26.0 - 34.0 pg   MCHC 32.9 30.0 - 36.0 g/dL   RDW 17.2 (H) 11.5 - 15.5 %   Platelets 651 (  H) 150 - 400 K/uL   nRBC 0.0 0.0 - 0.2 %   Neutrophils Relative % 62 %   Neutro Abs 4.0 1.7 - 7.7 K/uL   Lymphocytes Relative 29 %   Lymphs Abs 1.8 0.7 - 4.0 K/uL   Monocytes Relative 6 %   Monocytes Absolute 0.4  0.1 - 1.0 K/uL   Eosinophils Relative 2 %   Eosinophils Absolute 0.1 0.0 - 0.5 K/uL   Basophils Relative 1 %   Basophils Absolute 0.0 0.0 - 0.1 K/uL   Immature Granulocytes 0 %   Abs Immature Granulocytes 0.02 0.00 - 0.07 K/uL    Comment: Performed at [facility], [address] [place], [place] [address]  Acetaminophen level     Status: Abnormal   Collection Time: 01/06/22  2:52 PM  Result Value Ref Range   Acetaminophen (Tylenol), Serum <10 (L) 10 - 30 ug/mL    Comment: (NOTE) Therapeutic concentrations vary significantly. A range of 10-30 ug/mL  may be an effective concentration for many patients. However, some  are best treated at concentrations outside of this range. Acetaminophen concentrations >150 ug/mL at 4 hours after ingestion  and >50 ug/mL at 12 hours after ingestion are often associated with  toxic reactions.  Performed at [facility], [address] [place], [place] [address]   Salicylate level     Status: Abnormal   Collection Time: 01/06/22  2:52 PM  Result Value Ref Range   Salicylate Lvl <4.8 (L) 7.0 - 30.0 mg/dL    Comment: Performed at [facility], [address] [place], [place] [address]  POC urine preg, ED     Status: None   Collection Time: 01/06/22  3:45 PM  Result Value Ref Range   Preg Test, Ur NEGATIVE NEGATIVE    Comment:        THE SENSITIVITY OF THIS METHODOLOGY IS >24 mIU/mL   Resp Panel by RT-PCR (Flu A&B, Covid) Anterior Nasal Swab     Status: None   Collection Time: 01/06/22  4:50 PM   Specimen: Anterior Nasal Swab  Result Value Ref Range   SARS Coronavirus 2 by RT PCR NEGATIVE NEGATIVE    Comment: (NOTE) SARS-CoV-2 target nucleic acids are NOT DETECTED.  The SARS-CoV-2 RNA is generally detectable in upper respiratory specimens during the acute phase of infection. The lowest concentration of SARS-CoV-2 viral copies this assay can detect is 138 copies/mL. A negative result does not preclude SARS-Cov-2 infection and should not be  used as the sole basis for treatment or other patient management decisions. A negative result may occur with  improper specimen collection/handling, submission of specimen other than nasopharyngeal swab, presence of viral mutation(s) within the areas targeted by this assay, and inadequate number of viral copies(<138 copies/mL). A negative result must be combined with clinical observations, patient history, and epidemiological information. The expected result is Negative.  Fact Sheet for Patients:  EntrepreneurPulse.com.au  Fact Sheet for Healthcare Providers:  IncredibleEmployment.be  This test is no t yet approved or cleared by the [place] FDA and  has been authorized for detection and/or diagnosis of SARS-CoV-2 by FDA under an Emergency Use Authorization (EUA). This EUA will remain  in effect (meaning this test can be used) for the duration of the COVID-19 declaration under Section 564(b)(1) of the Act, 21 U.S.C.section 360bbb-3(b)(1), unless the authorization is terminated  or revoked sooner.       Influenza A by PCR NEGATIVE NEGATIVE   Influenza B by PCR NEGATIVE NEGATIVE  Comment: (NOTE) The Xpert Xpress SARS-CoV-2/FLU/RSV plus assay is intended as an aid in the diagnosis of influenza from Nasopharyngeal swab specimens and should not be used as a sole basis for treatment. Nasal washings and aspirates are unacceptable for Xpert Xpress SARS-CoV-2/FLU/RSV testing.  Fact Sheet for Patients: EntrepreneurPulse.com.au  Fact Sheet for Healthcare Providers: IncredibleEmployment.be  This test is not yet approved or cleared by the [place] FDA and has been authorized for detection and/or diagnosis of SARS-CoV-2 by FDA under an Emergency Use Authorization (EUA). This EUA will remain in effect (meaning this test can be used) for the duration of the COVID-19 declaration under Section 564(b)(1) of the  Act, 21 U.S.C. section 360bbb-3(b)(1), unless the authorization is terminated or revoked.  Performed at [facility], [address], [place], [place] [address]     Medications:  Current Facility-Administered Medications  Medication Dose Route Frequency Provider Last Rate Last Admin   amLODipine (NORVASC) tablet 5 mg  5 mg Oral Daily Cristie Hem, MD   5 mg at 01/08/22 7322   apixaban (ELIQUIS) tablet 5 mg  5 mg Oral BID Cristie Hem, MD   5 mg at 01/08/22 0254   cloNIDine (CATAPRES) tablet 0.1 mg  0.1 mg Oral Daily Leevy-Johnson, Traven Davids A, NP   0.1 mg at 01/08/22 2706   cyanocobalamin (VITAMIN B12) tablet 1,000 mcg  1,000 mcg Oral Daily Leevy-Johnson, Laine Giovanetti A, NP   1,000 mcg at 01/08/22 0835   DULoxetine (CYMBALTA) DR capsule 30 mg  30 mg Oral BID Leevy-Johnson, Treshawn Allen A, NP   30 mg at 01/08/22 0834   furosemide (LASIX) tablet 20 mg  20 mg Oral Daily Cristie Hem, MD   20 mg at 01/08/22 2376   hydrOXYzine (ATARAX) tablet 100 mg  100 mg Oral BID Leevy-Johnson, Leiam Hopwood A, NP   100 mg at 01/08/22 0834   lurasidone (LATUDA) tablet 40 mg  40 mg Oral Q breakfast Leevy-Johnson, Arli Bree A, NP   40 mg at 01/08/22 0834   melatonin tablet 3 mg  3 mg Oral QHS Leevy-Johnson, Abbegayle Denault A, NP   3 mg at 01/07/22 2118   mometasone-formoterol (DULERA) 200-5 MCG/ACT inhaler 2 puff  2 puff Inhalation BID Cristie Hem, MD   2 puff at 01/08/22 0841   pantoprazole (PROTONIX) EC tablet 40 mg  40 mg Oral Daily Cristie Hem, MD   40 mg at 01/08/22 0835   potassium chloride SA (KLOR-CON M) CR tablet 20 mEq  20 mEq Oral Daily Cristie Hem, MD   20 mEq at 01/08/22 2831   Vitamin D (Ergocalciferol) (DRISDOL) 1.25 MG (50000 UNIT) capsule 50,000 Units  50,000 Units Oral Q7 days Leevy-Johnson, Ellery Tash A, NP   50,000 Units at 01/08/22 5176   Current Outpatient Medications  Medication Sig Dispense Refill   albuterol (PROVENTIL) (2.5 MG/3ML) 0.083% nebulizer solution Take 2.5 mg by  nebulization every 6 (six) hours as needed for shortness of breath or wheezing.     albuterol (VENTOLIN HFA) 108 (90 Base) MCG/ACT inhaler Inhale into the lungs.     amLODipine (NORVASC) 5 MG tablet Take 1 tablet (5 mg total) by mouth daily. 30 tablet 0   anagrelide (AGRYLIN) 1 MG capsule Take 1 capsule (1 mg total) by mouth 2 (two) times daily. 60 capsule 0   benzonatate (TESSALON) 100 MG capsule Take 1 capsule (100 mg total) by mouth 2 (two) times daily as needed for cough. 20 capsule 0   benztropine (COGENTIN) 0.5 MG  tablet Take 1 tablet (0.5 mg total) by mouth 2 (two) times daily as needed for tremors (EPS/dystonia). 30 tablet 0   citalopram (CELEXA) 20 MG tablet Take 1 tablet (20 mg total) by mouth daily. 30 tablet 0   cloNIDine (CATAPRES) 0.1 MG tablet Take 0.1 mg by mouth daily.     DULoxetine (CYMBALTA) 30 MG capsule Take 30 mg by mouth 2 (two) times daily.     fluticasone furoate-vilanterol (BREO ELLIPTA) 200-25 MCG/ACT AEPB Inhale 1 puff into the lungs daily. 60 each 2   furosemide (LASIX) 20 MG tablet Take 1 tablet (20 mg total) by mouth daily. 30 tablet 10   hydrOXYzine (VISTARIL) 50 MG capsule Take 100 mg by mouth 2 (two) times daily.     lubiprostone (AMITIZA) 24 MCG capsule Take 1 capsule (24 mcg total) by mouth 2 (two) times daily with a meal. 60 capsule 0   lurasidone (LATUDA) 40 MG TABS tablet Take 40 mg by mouth daily with breakfast.     melatonin 3 MG TABS tablet Take 1 tablet (3 mg total) by mouth at bedtime. 30 tablet 0   methocarbamol (ROBAXIN) 500 MG tablet Take 500 mg by mouth 2 (two) times daily.     mirtazapine (REMERON) 15 MG tablet Take 15 mg by mouth at bedtime.     mometasone-formoterol (DULERA) 200-5 MCG/ACT AERO Inhale 2 puffs into the lungs 2 (two) times daily. 1 each 0   omeprazole (PRILOSEC) 20 MG capsule Take 20 mg by mouth daily.     orphenadrine (NORFLEX) 100 MG tablet Take 100 mg by mouth 2 (two) times daily as needed.     oxyCODONE (ROXICODONE) 5 MG  immediate release tablet Take 1 tablet (5 mg total) by mouth every 6 (six) hours as needed for severe pain. 15 tablet 0   pantoprazole (PROTONIX) 40 MG tablet Take 1 tablet (40 mg total) by mouth daily. 30 tablet 0   potassium chloride SA (KLOR-CON M) 20 MEQ tablet Take 1 tablet (20 mEq total) by mouth daily. TAKE (1) TABLET BY MOUTH ONCE A DAY. Strength: 20 mEq 30 tablet 0   pregabalin (LYRICA) 150 MG capsule Take 1 capsule (150 mg total) by mouth 2 (two) times daily. 60 capsule 5   tiZANidine (ZANAFLEX) 4 MG tablet Take 1 tablet (4 mg total) by mouth every 8 (eight) hours as needed for muscle spasms. 30 tablet 0   traZODone (DESYREL) 100 MG tablet Take 100 mg by mouth at bedtime.     vitamin B-12 1000 MCG tablet Take 1 tablet (1,000 mcg total) by mouth daily. 30 tablet 0   Vitamin D, Ergocalciferol, (DRISDOL) 1.25 MG (50000 UNIT) CAPS capsule Take 1 capsule (50,000 Units total) by mouth every 7 (seven) days. 5 capsule 0   risperiDONE (RISPERDAL) 0.5 MG tablet Take 1 tablet (0.5 mg total) by mouth daily. (Patient not taking: Reported on 12/20/2021) 30 tablet 0   risperiDONE (RISPERDAL) 1 MG tablet Take 1 tablet (1 mg total) by mouth at bedtime. (Patient not taking: Reported on 12/20/2021) 30 tablet 0    Musculoskeletal: Strength & Muscle Tone: within normal limits Gait & Station: normal Patient leans: N/A  Psychiatric Specialty Exam:  Presentation  General Appearance: Appropriate for Environment; Fairly Groomed  Eye Contact:Fair  Speech:Clear and Coherent  Speech Volume:Normal  Handedness:Right   Mood and Affect  Mood:Euthymic  Affect:Congruent   Thought Process  Thought Processes:Coherent  Descriptions of Associations:Intact  Orientation:Full (Time, Place and Person)  Thought Content:Logical  History  of Schizophrenia/Schizoaffective disorder:Yes  Duration of Psychotic Symptoms:N/A  Hallucinations:Hallucinations: None  Ideas of Reference:None  Suicidal  Thoughts:Suicidal Thoughts: No  Homicidal Thoughts:Homicidal Thoughts: No   Sensorium  Memory:Immediate Fair; Recent Fair  Judgment:Fair  Insight:Fair   Executive Functions  Concentration:Fair  Attention Span:Fair  [place]   Psychomotor Activity  Psychomotor Activity:Psychomotor Activity: Normal  Assets  Assets:Resilience; Communication Skills   Sleep  Sleep:Sleep: Good   Physical Exam: Physical Exam Vitals and nursing note reviewed.  Constitutional:      Appearance: She is obese.  HENT:     Head: Normocephalic.     Nose: Nose normal.     Mouth/Throat:     Mouth: Mucous membranes are moist.     Pharynx: Oropharynx is clear.  Eyes:     Pupils: Pupils are equal, round, and reactive to light.  Cardiovascular:     Rate and Rhythm: Normal rate.     Pulses: Normal pulses.  Pulmonary:     Effort: Pulmonary effort is normal.  Abdominal:     Palpations: Abdomen is soft.  Musculoskeletal:        General: Normal range of motion.     Cervical back: Normal range of motion.  Skin:    General: Skin is warm and dry.  Neurological:     Mental Status: She is alert and oriented to person, place, and time.  Psychiatric:        Attention and Perception: Attention and perception normal.        Mood and Affect: Mood is depressed. Affect is blunt.        Speech: Speech normal.        Behavior: Behavior is cooperative.        Thought Content: Thought content includes suicidal ideation. Thought content does not include homicidal ideation. Thought content does not include homicidal or suicidal plan.        Cognition and Memory: Cognition and memory normal.        Judgment: Judgment is impulsive.    Review of Systems  Psychiatric/Behavioral:  Positive for depression, substance abuse and suicidal ideas. The patient is nervous/anxious.   All other systems reviewed and are negative.  Blood pressure 125/70, pulse 71, temperature  99 F (37.2 C), temperature source Oral, resp. rate 20, height '4\' 11"'$  (1.499 m), weight 74.8 kg, last menstrual period 06/07/2020, SpO2 98 %. Body mass index is 33.33 kg/m.  Treatment Plan Summary: Daily contact with patient to assess and evaluate symptoms and progress in treatment, Medication management, and Plan seek psychiatric hospital admission for further stabilization and treatment.   Disposition: Recommend psychiatric Inpatient admission when medically cleared. Supportive therapy provided about ongoing stressors. Pt accepted to [facility].   This service was provided via telemedicine using a 2-way, interactive audio and video technology.  Names of all persons participating in this telemedicine service and their role in this encounter. Name: Oneida Alar Role: PMHNP  Name: Hampton Abbot Role: Attending MD  Name: Ferdinand Lango Role: patient  Name:  Role:     Inda Merlin, NP 01/08/2022 8:44 AM

## 2022-01-08 NOTE — Progress Notes (Addendum)
Per Oris Drone, [facility], pt has been accepted to [facility] bed 303-1. Accepting provider is Suzzanne Cloud, Attending provider is Dr. Winfred Leeds. Patient can arrive by 12:00pm. Number for report is [phone].   Glennie Isle, MSW, LCSW-A Phone: [phone] Disposition/TOC

## 2022-01-08 NOTE — Tx Team (Signed)
Initial Treatment Plan 01/08/2022 7:43 PM JAKEYA GHERARDI PPJ:[id number]    PATIENT STRESSORS: Ability for insight  Communication skills  Motivation for treatment/growth    PATIENT STRENGTHS: Average or above average intelligence  Capable of independent living  Communication skills  General fund of knowledge  Motivation for treatment/growth    PATIENT IDENTIFIED PROBLEMS:                      DISCHARGE CRITERIA:  Improved stabilization in mood, thinking, and/or behavior Motivation to continue treatment in a less acute level of care Need for constant or close observation no longer present Reduction of life-threatening or endangering symptoms to within safe limits Safe-care adequate arrangements made Verbal commitment to aftercare and medication compliance Withdrawal symptoms are absent or subacute and managed without 24-hour nursing intervention  PRELIMINARY DISCHARGE PLAN: Attend aftercare/continuing care group Attend PHP/IOP Attend 12-step recovery group Outpatient therapy Placement in alternative living arrangements  PATIENT/FAMILY INVOLVEMENT: This treatment plan has been presented to and reviewed with the patient, [name]..  The patient and family have been given the opportunity to ask questions and make suggestions.  Grayland Ormond [place], [place] 01/08/2022, 7:43 PM

## 2022-01-08 NOTE — ED Notes (Signed)
Lunch tray given to pt.

## 2022-01-08 NOTE — Progress Notes (Signed)
   01/08/22 2241  Psych Admission Type (Psych Patients Only)  Admission Status Voluntary  Psychosocial Assessment  Patient Complaints Depression  Eye Contact Brief  Facial Expression Flat  Affect Appropriate to circumstance  Speech Logical/coherent  Interaction Minimal  Motor Activity Other (Comment) (WDL)  Appearance/Hygiene Disheveled;Poor hygiene  Behavior Characteristics Appropriate to situation  Mood Depressed  Thought Process  Coherency WDL  Content WDL  Delusions None reported or observed  Perception WDL  Hallucination None reported or observed  Judgment Impaired  Confusion Mild  Danger to Self  Current suicidal ideation? Denies  Agreement Not to Harm Self Yes  Description of Agreement verbal  Danger to Others  Danger to Others None reported or observed

## 2022-01-08 NOTE — ED Notes (Signed)
Breakfast tray given to pt 
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
## 2022-01-08 NOTE — ED Notes (Signed)
Pt given clean scrub pants.

## 2022-01-08 NOTE — Plan of Care (Signed)
Nurse discussed anxiety, depression and coping skills with patient.  

## 2022-01-08 NOTE — Progress Notes (Signed)
Did not attended

## 2022-01-09 ENCOUNTER — Encounter (HOSPITAL_COMMUNITY): Payer: Self-pay

## 2022-01-09 DIAGNOSIS — F1994 Other psychoactive substance use, unspecified with psychoactive substance-induced mood disorder: Secondary | ICD-10-CM | POA: Diagnosis not present

## 2022-01-09 DIAGNOSIS — F19959 Other psychoactive substance use, unspecified with psychoactive substance-induced psychotic disorder, unspecified: Secondary | ICD-10-CM

## 2022-01-09 MED ORDER — LIDOCAINE 5 % EX PTCH
2.0000 | MEDICATED_PATCH | Freq: Every day | CUTANEOUS | Status: DC
  Administered 2022-01-09 – 2022-01-15 (×7): 2 via TRANSDERMAL
  Filled 2022-01-09 (×9): qty 2

## 2022-01-09 MED ORDER — GABAPENTIN 100 MG PO CAPS
100.0000 mg | ORAL_CAPSULE | Freq: Three times a day (TID) | ORAL | Status: DC
  Filled 2022-01-09 (×6): qty 1

## 2022-01-09 MED ORDER — DULOXETINE HCL 60 MG PO CPEP
60.0000 mg | ORAL_CAPSULE | Freq: Every day | ORAL | Status: DC
  Administered 2022-01-10 – 2022-01-15 (×6): 60 mg via ORAL
  Filled 2022-01-09 (×7): qty 1

## 2022-01-09 NOTE — H&P (Addendum)
Psychiatric Adult Admission Assessment  Patient Identification: Kathryn [name] MRN:  [id number] Date of Evaluation:  01/09/2022 Chief Complaint:  Substance induced mood disorder ([place]) [F19.94] Principal Diagnosis: MDD (major depressive disorder), recurrent episode, severe ([place]) Diagnosis:  Principal Problem:   MDD (major depressive disorder), recurrent episode, severe ([place]) Active Problems:   Hypertension   Chronic pain   Essential thrombocytosis ([place])   Severe stimulant use disorder ([place])   Substance-induced psychotic disorder ([place])   CC: Depression, SI with plan, Cocaine Use  History of Present Illness:  Kathryn [name] is a 51 y.o. female with psychiatric hx of MDD, substance-induced psychotic disorder, polysubstance abuse, severe stimulant use disorder-cocaine type who presented to [facility] reporting increased depression and suicidal ideations with plan to cut her wrists. Now admitted to [facility] for further treatment.  The patient reports that her depression is largely related to psychosocial stressors, most notably homelessness. She reports that she had previously been living with her daughter, but 4 days ago her daughter's significant other moved back in, prompting an argument between the patient and the significant other. The patient states that her daughter kicked her out and allowed her significant other to stay, and the patient reports that being homeless again was the source of her suicidal thoughts. She reports that just prior to presenting of the ED at [facility], she was holding a knife and was planning to cut her wrists and her neck when a family member stepped in and intervened, though this is inconsistent with the report from [facility] ED.   Additionally, the patient states that she has been using cocaine twice per day for the past 2 weeks. She states that her friends buy her cocaine and that she does not have to pay for it. She endorses that many years ago she exchanged sex for  drugs, but she has not done this in many years, and she is not currently sexually active. She has no concern for STI at this time. Regarding symptoms of psychosis, the patient states that in the past her mania, hallucinations, and other psychotic symptoms have always She states that she does not typically drink alcohol, but she began drinking again over the weekend in response to the stress of her daughter kicking her out. She states that she drank a whole bottle of gin over the course of the weekend.    Patient endorses symptoms of mania but only in the setting of cocaine use.  Patient denies any manic/hypomanic symptoms outside of stimulant use.  She was hospitalized at [facility] from 10/17/21 to 10/22/21. She has also been admitted to [facility] this year. She receives her current outpatient psychiatric care from [facility] in [place].   At present, patient denies SI/HI/AH. Still endorsing depression and anxiety. Reports "seeing little people" last night when falling asleep. Reports psychotic symptoms only when withdrawing or using cocaine.  Collateral: Philomena Doheny (family friend per pt) Information obtained outlined below: Mr Charissa Bash stated that he is concerned about the patient's cocaine use, but he is not aware of the events that led up to the patient's hospitalization. He states that the patient is easily influenced, and she has friends that encourage her to use cocaine. He stated that he was unaware who the patient had been living with and did not know anything about the patient's suicidal thoughts or intent to harm herself.    Past Psychiatric Hx: Previous Psych Diagnoses: MDD, cocaine-induced mood disorder, polysubstance abuse, schizoaffective disorder bipolar type Prior inpatient treatment: She was  hospitalized at [facility] from 10/17/21 to 12/07/21. She has also been admitted to [facility] this year. Current/prior outpatient treatment: She receives her current outpatient psychiatric care from [facility]  in [place].  Psychotherapy hx: unknown History of suicide: previous attempt September 2020 by consuming bleach and vinegar History of homicide: none Psychiatric medication history: Duloxetine 30 mg, Benztropine 0.5 mg, citalopram 20 mg,  Current Facility-Administered Medications:  Psychiatric medication compliance history: poor compliance Neuromodulation history: none Current Psychiatrist: [facility] Current therapist: unknown  Substance Abuse Hx: Alcohol: Typically does not use. Endorsed drinking a bottle of gin over the weekend. Tobacco: Denied Illicit drugs: Smokes cocaine twice a day since teenager. Longest period of sobriety was 6 months. Rx drug abuse: none reported Rehab hx: none reported  Past Medical History: Medical Diagnoses:  Past Medical History:  Diagnosis Date   Anxiety    Arthritis    Asthma    Bipolar disorder ([place])    Chronic back pain    COPD (chronic obstructive pulmonary disease) (HCC)    Chronic bronchitis   Depression    Dyspnea    GERD (gastroesophageal reflux disease)    Hypertension    Migraine    Neuropathy    Osteoarthritis of left knee, 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   Single subsegmental pulmonary embolism without acute cor pulmonale ([place]) 06/20/2021   Sleep apnea 04/2020   GETTING A cpap   Suicidal ideation 12/24/2018   Current home medications: Prior Hosp: Prior Surgeries/Trauma (Head trauma, LOC, concussions, seizures): Allergies: Allergies  Allergen Reactions   Clonopin [Clonazepam] Anaphylaxis    Swelling of the tongue and mouth.    Fish Allergy Anaphylaxis, Shortness Of Breath and Swelling   Flexeril [Cyclobenzaprine Hcl] Shortness Of Breath   Ibuprofen Anaphylaxis, Hives and Other (See Comments)   Shellfish Allergy Anaphylaxis   Tylenol [Acetaminophen] Anaphylaxis   Ace Inhibitors Cough   Peanut Allergen Powder-Dnfp    Tramadol Nausea And Vomiting    Upset stomach   Trazodone And Nefazodone Hives   PCP: Lindell Spar, MD  Family History: Family History  Problem Relation Age of Onset   Gout Paternal Grandfather    Cirrhosis Paternal Grandfather    Hypertension Paternal Grandmother    Aneurysm Paternal Grandmother    Cirrhosis Maternal Grandmother    Cirrhosis Maternal Grandfather    Cancer Father    Cirrhosis Father    Cirrhosis Mother    Breast cancer Sister    Hypertension Sister    Bronchitis Daughter    Bronchitis Daughter    Asthma Son    Bronchitis Son    Migraines Neg Hx      Social History: Housing: homeless, previously living with daughter Finances/Disability: receives social security disability Legal difficulties: none  Risk to self: No current SI Risk to others: No HI  Alcohol Screening:  1. How often do you have a drink containing alcohol?: 4 or more times a week 2. How many drinks containing alcohol do you have on a typical day when you are drinking?: 3 or 4 3. How often do you have six or more drinks on one occasion?: Weekly AUDIT-C Score: 8 4. How often during the last year have you found that you were not able to stop drinking once you had started?: Weekly 5. How often during the last year have you failed to do what was normally expected from you because of drinking?: Weekly 6. How often during the last year have you needed a first drink in the morning to get  yourself going after a heavy drinking session?: Weekly 7. How often during the last year have you had a feeling of guilt of remorse after drinking?: Weekly 8. How often during the last year have you been unable to remember what happened the night before because you had been drinking?: Weekly 9. Have you or someone else been injured as a result of your drinking?: No 10. Has a relative or friend or a doctor or another health worker been concerned about your drinking or suggested you cut down?: Yes, but not in the last year Alcohol Use Disorder Identification Test Final Score (AUDIT): 25 Alcohol Brief  Interventions/Follow-up: Alcohol education/Brief advice Tobacco Screening:   Social History:  Social History   Substance and Sexual Activity  Alcohol Use Yes   Alcohol/week: 4.0 standard drinks of alcohol   Types: 4 Cans of beer per week   Comment: 4 beers Saturday     Social History   Substance and Sexual Activity  Drug Use Yes   Types: Cocaine   Comment: crack  last used June 2023    Additional Social History: Marital status: Single Are you sexually active?: Yes What is your sexual orientation?: heterosexual Has your sexual activity been affected by drugs, alcohol, medication, or emotional stress?: Ye, I had back surgery Does patient have children?: Yes How many children?: 3                         Lab Results:  No results found for this or any previous visit (from the past 31 hour(s)).  Blood Alcohol level:  Lab Results  Component Value Date   ETH <10 01/06/2022   ETH <10 89/38/1017    Metabolic Disorder Labs:  Lab Results  Component Value Date   HGBA1C 5.5 10/19/2021   MPG 111.15 10/19/2021   MPG 108 04/20/2017   No results found for: "PROLACTIN" Lab Results  Component Value Date   CHOL 148 10/19/2021   TRIG 41 10/19/2021   HDL 56 10/19/2021   CHOLHDL 2.6 10/19/2021   VLDL 8 10/19/2021   LDLCALC 84 10/19/2021   LDLCALC 72 04/20/2017    Musculoskeletal: Strength & Muscle Tone: normal Gait & Station: normal  Psychiatric Specialty Exam: Presentation  General Appearance: Appropriate for Environment; Fairly Groomed  Eye Contact:Fair  Speech:Clear and Coherent  Speech Volume:Normal   Mood and Affect  Mood:Euthymic  Affect:Congruent   Thought Process  Thought Processes:Coherent  Descriptions of Associations:Intact  Orientation:Full (Time, Place and Person)  Thought Content:Logical  History of Schizophrenia/Schizoaffective disorder:Yes  Duration of Psychotic Symptoms:N/A  Hallucinations:Hallucinations: None  Ideas of  Reference:None  Suicidal Thoughts:Suicidal Thoughts: No  Homicidal Thoughts:Homicidal Thoughts: No   Sensorium  Memory:Immediate Fair; Recent Fair  Judgment:Fair  Insight:Fair   Executive Functions  Concentration:Fair  Attention Span:Fair  [place]   Psychomotor Activity  Psychomotor Activity:Psychomotor Activity: Normal   Assets  Assets:Resilience; Communication Skills   Sleep  Sleep:Sleep: Good   Physical Exam: Physical Exam Vitals and nursing note reviewed.  Constitutional:      Appearance: Normal appearance. She is normal weight.  HENT:     Head: Normocephalic and atraumatic.  Cardiovascular:     Rate and Rhythm: Normal rate.  Pulmonary:     Effort: Pulmonary effort is normal.  Musculoskeletal:        General: No swelling.  Neurological:     General: No focal deficit present.     Mental Status: She is  alert and oriented to person, place, and time.     Gait: Gait normal.    Review of Systems  Respiratory:  Negative for shortness of breath.   Cardiovascular:  Negative for chest pain.  Gastrointestinal:  Negative for abdominal pain, constipation, diarrhea, heartburn, nausea and vomiting.  Musculoskeletal:  Positive for myalgias.  Neurological:  Negative for seizures and headaches.  Psychiatric/Behavioral:  Positive for depression, hallucinations (substance induced), substance abuse and suicidal ideas. The patient is nervous/anxious.    Blood pressure 108/74, pulse 72, temperature 98.5 F (36.9 C), temperature source Oral, resp. rate 18, height '4\' 11"'$  (1.499 m), weight 85.3 kg, last menstrual period 06/07/2020, SpO2 100 %. Body mass index is 37.97 kg/m.  Treatment Plan Summary: TEREA NEUBAUER is a 51 y.o. female with psychiatric hx of MDD, cocaine-induced mood disorder, polysubstance abuse, schizoaffective disorder bipolar type who presented to [facility] reporting increased depression and suicidal ideations  with plan to cut her wrists. Now admitted to [facility] for further treatment.    Safety and Monitoring: voluntarily admission to inpatient psychiatric unit for safety, stabilization and treatment Daily contact with patient to assess and evaluate symptoms and progress in treatment Appropriate medication management to further stabilize patient Patient's case will be regularly discussed in multi-disciplinary team meeting Observation Level : q15 minute checks Vital signs: q12 hours Precautions: suicide, elopement, and assault  2. Psychiatric Problems MDD-Severe, recurrent episode without psychosis Stimulant use disorder-cocaine Nicotine use disorder  -- Continue Lurasidone 40 mg for depression  -- INCREASE Duloxetine to 60 mg for depression symptoms -- The risks/benefits/side-effects/alternatives to this medication were discussed in detail with the patient and time was given for questions. The patient consents to medication trial.   -- Metabolic profile and EKG monitoring obtained while on an atypical antipsychotic (BMI: 37.97 Lipid Panel: ordered HbgA1c: ordered QTc: ordered)   -- Encouraged patient to participate in unit milieu and in scheduled group therapies   -- Short Term Goals: Ability to identify changes in lifestyle to reduce recurrence of condition will improve  -- Long Term Goals: Improvement in symptoms so as ready for discharge   3. Medical Management Labs and Imaging Patient's recent labs were reviewed and pertinent positives and negatives are listed below: CMP: K 3.4 CBC: Plt 651 EtOH: <10 Lab Results  Component Value Date   ETH <10 01/06/2022   ETH <10 10/16/2021   UDS: Positive for cocaine only TSH: WNL two months ago A1C: 5.5 two months ago Lipids: WNL two months ago ECG personal interpretation: pending Further labs ordered: CBC w/ diff, lipid panel, BMP, TSH, A1C  Medical Problems Hx of DVT/PE in May 2022 Completed treatment regiment. No new acute symptoms. Last  admission in July 2023, IM was consulted and recommended to NOT resume eliquis as no indication for it. She was resumed on eliquis in ED for no clear reason.  -Monitor for new unilateral sharp pain with associated swelling  Chronic pain secondary to spondylolisthesis s/p lumbar fusion May 2022 Associated neuropathy -Previously was on pregabalin but switched to gabapentin due to abuse potential. Patient refused gabapentin  Hypertension BP 108/74 -Amlodipine 5 mg daily -DISCONTINUE clonidine as patient has not been taking and BP wnl  Essential thrombocytosis MPN Panel: CALR [id number] Follows with Dr. Lindi Adie (last seen 09/2021, recommended continuing anagrelide with followup in ~3 months) - Continue anagrelide 1 mg bid - AM CBC - OP follow up with Dr. Lindi Adie  BLE edema, stable Takes Lasix PRN and was only scheduled on this in  ED  -Discontinue lasix -Outpatient PCP follow up  PRN The following PRN medications were added to ensure patient can focus on treatment. These were discussed with patient and patient aware of ability to ask for the following medications:  -Tylenol 650 mg q6hr PRN for mild pain -Mylanta 30 ml suspension for indigestion -Milk of Magnesia 30 ml for constipation -Trazodone 50 mg qhs for insomnia -Hydroxyzine 25 mg tid PRN for anxiety  4. Discharge Planning Patient will require the following based on my assessment:  Greatly appreciate CSW and Case management assistance with facilitating these needs and any further recommendations regarding patient's needs upon discharge. Estimated LOS: 5-7 days Discharge Concerns: Need to establish a safety plan; Medication compliance and effectiveness Discharge Goals: Return home with outpatient referrals for mental health follow-up including medication management/psychotherapy   Long Term Goal(s): Minimizing disruption current psychiatric diagnosis is causing so that patient can be safely discharged Short Term Goals:  Compliance with proposed treatment plan and adjusting to psychiatric unit and peers.  I certify that inpatient services furnished can reasonably be expected to improve the patient's condition.     Elane Fritz, Medical Student Medical Student 9/25/20231:15 PM   Attestation for Student Documentation:  I personally was present and performed or re-performed the history, physical exam and medical decision-making activities of this service and have verified that the service and findings are accurately documented in the student's note.  France Ravens, MD 01/09/2022, 2:00 PM

## 2022-01-09 NOTE — Plan of Care (Signed)
Nurse discussed anxiety, depression and coping skills with patient.  
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
## 2022-01-09 NOTE — BHH Group Notes (Signed)
Pt did not attend A/A 

## 2022-01-09 NOTE — Progress Notes (Signed)
Pt was invited to group.   Did not attend.    ADULT GRIEF GROUP NOTE:  Spiritual care group on grief and loss facilitated by chaplain Jerene Pitch MDiv, [facility]  Group Goal:  Support / Education around grief and loss Members engage in facilitated group support and psycho-social education.  Group Description:  Following introductions and group rules, group members engaged in facilitated group dialog and support around topic of loss, with particular support around experiences of loss in their lives. Group Identified types of loss (relationships / self / things) and identified patterns, circumstances, and changes that precipitate losses. Reflected on thoughts / feelings around loss, normalized grief responses, and recognized variety in grief experience.   Group noted Worden's four tasks of grief in discussion.  Group drew on Adlerian / Rogerian, narrative, MI, Patient Progress: did not attend

## 2022-01-09 NOTE — Progress Notes (Signed)
D:  Patient's self inventory sheet, patient has poor sleep, no sleep medication taken.  Fair appetite, low energy level, poor concentration.  Rated depression, hopeless and anxiety #10.  Withdrawals, cramping, irritability.  Denied SI.  Physical problems, lightheaded, pain, dizzy, blurred vision.  Physical pain, worst pain #10.  Goal is keep focused on recovery.  Plans to attend meetings.  Needs glasses to see better.  No discharge plans.  No where to go. A:  Medications administered per MD orders.  Emotional support and encouragement given patient. R:  Denied SI and HI, contracts for safety.  Denied A/V hallucinations..  Safety maintained with 15 minute checks.

## 2022-01-09 NOTE — Group Note (Signed)
LCSW Group Therapy Note   Group Date: 01/09/2022 Start Time: 1300 End Time: 1400  Type of Therapy and Topic:  Group Therapy:  Attachment Styles    Participation Level:  None  Description of Group: This group addressed Attachment Styles that individual develop throughout there life. Patients shared about objects and people that they were attached to as children. Patients reflected how various relationship during their early development have influenced their behaviors in current relationships. Patients were provided worksheets and encouraged to read over the different ways they can begin to recognize their attachment styles and start to work towards a more secure and stable attachment with themselves and others. Therapeutic Goals Patient will discuss the 4 attachment styles Patient will demonstrate understanding of how these attachment styles can affect them and the people around them. Patient will verbalize how they believe these attachment styles affected them as both a child and adult. Patients will discuss the potential coping skills and ways that they can begin to work on a more secure attachment style.    Summary of Patient Progress:  The Pt came to group late but did accept the worksheets that were provided.    Therapeutic Modalities Cognitive Behavioral Therapy Motivational Interviewing  Darleen Crocker, [place] 01/09/2022  2:13 PM

## 2022-01-09 NOTE — Progress Notes (Signed)
Patient did not attend morning orientation group.  

## 2022-01-09 NOTE — BHH Counselor (Signed)
CSW provided the Pt with a packet that contains information including shelter and housing resources, free and reduced [place] food information, clothing resources, crisis center information, a Good[employer] card, and suicide prevention information.   

## 2022-01-09 NOTE — BH IP Treatment Plan (Signed)
Interdisciplinary Treatment and Diagnostic Plan Update  01/09/2022 Time of Session: 9:40am Kathryn [name] MRN: [id number]  Principal Diagnosis: MDD (major depressive disorder), recurrent episode, severe ([place])  Secondary Diagnoses: Principal Problem:   MDD (major depressive disorder), recurrent episode, severe ([place]) Active Problems:   Hypertension   Chronic pain   Essential thrombocytosis ([place])   Substance-induced psychotic disorder ([place])   Current Medications:  Current Facility-Administered Medications  Medication Dose Route Frequency Provider Last Rate Last Admin   albuterol (VENTOLIN HFA) 108 (90 Base) MCG/ACT inhaler 1 puff  1 puff Inhalation Q6H PRN France Ravens, MD       alum & mag hydroxide-simeth (MAALOX/MYLANTA) 200-200-20 MG/5ML suspension 30 mL  30 mL Oral Q4H PRN France Ravens, MD       amLODipine (NORVASC) tablet 5 mg  5 mg Oral Daily France Ravens, MD   5 mg at 01/09/22 0801   anagrelide (AGRYLIN) capsule 1 mg  1 mg Oral BID France Ravens, MD   1 mg at 01/09/22 1101   [START ON 01/10/2022] DULoxetine (CYMBALTA) DR capsule 60 mg  60 mg Oral Daily France Ravens, MD       hydrOXYzine (ATARAX) tablet 50 mg  50 mg Oral Q6H PRN France Ravens, MD   50 mg at 01/09/22 0815   lidocaine (LIDODERM) 5 % 2 patch  2 patch Transdermal Daily France Ravens, MD   2 patch at 01/09/22 1134   lurasidone (LATUDA) tablet 40 mg  40 mg Oral Q breakfast France Ravens, MD   40 mg at 01/09/22 0801   magnesium hydroxide (MILK OF MAGNESIA) suspension 30 mL  30 mL Oral Daily PRN France Ravens, MD       melatonin tablet 5 mg  5 mg Oral QHS PRN France Ravens, MD       mometasone-formoterol [facility]) 100-5 MCG/ACT inhaler 2 puff  2 puff Inhalation BID France Ravens, MD   2 puff at 01/09/22 0808   nicotine (NICODERM CQ - dosed in mg/24 hours) patch 21 mg  21 mg Transdermal Daily Attiah, Nadir, MD   21 mg at 01/09/22 0801   PTA Medications: Medications Prior to Admission  Medication Sig Dispense Refill Last Dose   albuterol (VENTOLIN  HFA) 108 (90 Base) MCG/ACT inhaler Inhale into the lungs.      amLODipine (NORVASC) 5 MG tablet Take 1 tablet (5 mg total) by mouth daily. 30 tablet 0    anagrelide (AGRYLIN) 1 MG capsule Take 1 capsule (1 mg total) by mouth 2 (two) times daily. 60 capsule 0    benzonatate (TESSALON) 100 MG capsule Take 1 capsule (100 mg total) by mouth 2 (two) times daily as needed for cough. 20 capsule 0    benztropine (COGENTIN) 0.5 MG tablet Take 1 tablet (0.5 mg total) by mouth 2 (two) times daily as needed for tremors (EPS/dystonia). 30 tablet 0    citalopram (CELEXA) 20 MG tablet Take 1 tablet (20 mg total) by mouth daily. 30 tablet 0    cloNIDine (CATAPRES) 0.1 MG tablet Take 0.1 mg by mouth daily.      DULoxetine (CYMBALTA) 30 MG capsule Take 30 mg by mouth 2 (two) times daily.      fluticasone furoate-vilanterol (BREO ELLIPTA) 200-25 MCG/ACT AEPB Inhale 1 puff into the lungs daily. 60 each 2    furosemide (LASIX) 20 MG tablet Take 1 tablet (20 mg total) by mouth daily. 30 tablet 10    hydrOXYzine (VISTARIL) 50 MG capsule Take 100 mg by mouth 2 (  two) times daily.      lubiprostone (AMITIZA) 24 MCG capsule Take 1 capsule (24 mcg total) by mouth 2 (two) times daily with a meal. 60 capsule 0    lurasidone (LATUDA) 40 MG TABS tablet Take 40 mg by mouth daily with breakfast.      melatonin 3 MG TABS tablet Take 1 tablet (3 mg total) by mouth at bedtime. 30 tablet 0    methocarbamol (ROBAXIN) 500 MG tablet Take 500 mg by mouth 2 (two) times daily.      mirtazapine (REMERON) 15 MG tablet Take 15 mg by mouth at bedtime.      mometasone-formoterol (DULERA) 200-5 MCG/ACT AERO Inhale 2 puffs into the lungs 2 (two) times daily. 1 each 0    omeprazole (PRILOSEC) 20 MG capsule Take 20 mg by mouth daily.      orphenadrine (NORFLEX) 100 MG tablet Take 100 mg by mouth 2 (two) times daily as needed.      oxyCODONE (ROXICODONE) 5 MG immediate release tablet Take 1 tablet (5 mg total) by mouth every 6 (six) hours as needed  for severe pain. 15 tablet 0    pantoprazole (PROTONIX) 40 MG tablet Take 1 tablet (40 mg total) by mouth daily. 30 tablet 0    potassium chloride SA (KLOR-CON M) 20 MEQ tablet Take 1 tablet (20 mEq total) by mouth daily. TAKE (1) TABLET BY MOUTH ONCE A DAY. Strength: 20 mEq 30 tablet 0    pregabalin (LYRICA) 150 MG capsule Take 1 capsule (150 mg total) by mouth 2 (two) times daily. 60 capsule 5    risperiDONE (RISPERDAL) 0.5 MG tablet Take 1 tablet (0.5 mg total) by mouth daily. (Patient not taking: Reported on 12/20/2021) 30 tablet 0    risperiDONE (RISPERDAL) 1 MG tablet Take 1 tablet (1 mg total) by mouth at bedtime. (Patient not taking: Reported on 12/20/2021) 30 tablet 0    tiZANidine (ZANAFLEX) 4 MG tablet Take 1 tablet (4 mg total) by mouth every 8 (eight) hours as needed for muscle spasms. 30 tablet 0    traZODone (DESYREL) 100 MG tablet Take 100 mg by mouth at bedtime.      vitamin B-12 1000 MCG tablet Take 1 tablet (1,000 mcg total) by mouth daily. 30 tablet 0    Vitamin D, Ergocalciferol, (DRISDOL) 1.25 MG (50000 UNIT) CAPS capsule Take 1 capsule (50,000 Units total) by mouth every 7 (seven) days. 5 capsule 0     Patient Stressors: Legal issues  Patient Strengths: Average or above average intelligence  Capable of independent living  Communication skills  General fund of knowledge  Motivation for treatment/growth   Treatment Modalities: Medication Management, Group therapy, Case management,  1 to 1 session with clinician, Psychoeducation, Recreational therapy.   Physician Treatment Plan for Primary Diagnosis: MDD (major depressive disorder), recurrent episode, severe ([place]) Long Term Goal(s):     Short Term Goals:    Medication Management: Evaluate patient's response, side effects, and tolerance of medication regimen.  Therapeutic Interventions: 1 to 1 sessions, Unit Group sessions and Medication administration.  Evaluation of Outcomes: Not Met  Physician Treatment Plan for  Secondary Diagnosis: Principal Problem:   MDD (major depressive disorder), recurrent episode, severe ([place]) Active Problems:   Hypertension   Chronic pain   Essential thrombocytosis ([place])   Substance-induced psychotic disorder ([place])  Long Term Goal(s):     Short Term Goals:       Medication Management: Evaluate patient's response, side effects, and tolerance of medication  regimen.  Therapeutic Interventions: 1 to 1 sessions, Unit Group sessions and Medication administration.  Evaluation of Outcomes: Not Met   RN Treatment Plan for Primary Diagnosis: MDD (major depressive disorder), recurrent episode, severe ([place]) Long Term Goal(s): Knowledge of disease and therapeutic regimen to maintain health will improve  Short Term Goals: Ability to remain free from injury will improve, Ability to verbalize frustration and anger appropriately will improve, Ability to demonstrate self-control, Ability to participate in decision making will improve, Ability to verbalize feelings will improve, Ability to disclose and discuss suicidal ideas, Ability to identify and develop effective coping behaviors will improve, and Compliance with prescribed medications will improve  Medication Management: RN will administer medications as ordered by provider, will assess and evaluate patient's response and provide education to patient for prescribed medication. RN will report any adverse and/or side effects to prescribing provider.  Therapeutic Interventions: 1 on 1 counseling sessions, Psychoeducation, Medication administration, Evaluate responses to treatment, Monitor vital signs and CBGs as ordered, Perform/monitor CIWA, COWS, AIMS and Fall Risk screenings as ordered, Perform wound care treatments as ordered.  Evaluation of Outcomes: Not Met   LCSW Treatment Plan for Primary Diagnosis: MDD (major depressive disorder), recurrent episode, severe ([place]) Long Term Goal(s): Safe transition to appropriate next level of  care at discharge, Engage patient in therapeutic group addressing interpersonal concerns.  Short Term Goals: Engage patient in aftercare planning with referrals and resources, Increase social support, Increase ability to appropriately verbalize feelings, Increase emotional regulation, Facilitate acceptance of mental health diagnosis and concerns, Facilitate patient progression through stages of change regarding substance use diagnoses and concerns, Identify triggers associated with mental health/substance abuse issues, and Increase skills for wellness and recovery  Therapeutic Interventions: Assess for all discharge needs, 1 to 1 time with Social worker, Explore available resources and support systems, Assess for adequacy in community support network, Educate family and significant other(s) on suicide prevention, Complete Psychosocial Assessment, Interpersonal group therapy.  Evaluation of Outcomes: Not Met   Progress in Treatment: Attending groups: No. Participating in groups: No. Taking medication as prescribed: Yes. Toleration medication: Yes. Family/Significant other contact made: No, will contact:   Philomena Doheny [phone] Patient understands diagnosis: Yes. Discussing patient identified problems/goals with staff: Yes. Medical problems stabilized or resolved: Yes. Denies suicidal/homicidal ideation: Yes. Issues/concerns per patient self-inventory: No.  New problem(s) identified: No, Describe:  none reported   New Short Term/Long Term Goal(s): detox, medication management for mood stabilization; elimination of SI thoughts; development of comprehensive mental wellness/sobriety plan    Patient Goals:  Pt states that she would like to continue to work on her sobriety.  Discharge Plan or Barriers: Patient recently admitted. CSW will continue to follow and assess for appropriate referrals and possible discharge planning.    Reason for Continuation of Hospitalization:  Anxiety Depression Medication stabilization Suicidal ideation Withdrawal symptoms  Estimated Length of Stay: 3-7 days  Last 3 [place] Suicide Severity Risk Score: [place] Admission (Current) from 01/08/2022 in [place] ED from 01/06/2022 in [place] ED from 11/09/2021 in [place] High Risk High Risk Error: Q7 should not be populated when Q6 is No       Last PHQ 2/9 Scores:    12/20/2021   11:26 AM 11/03/2021    9:10 AM 09/06/2021    2:18 PM  Depression screen PHQ 2/9  Decreased Interest 3 0 0  Down, Depressed, Hopeless 3 0 0  PHQ - 2 Score 6 0 0  Altered sleeping 3  0  Tired, decreased energy 3  0  Change in appetite 3  0  Feeling bad or failure about yourself  3  0  Trouble concentrating 3  0  Moving slowly or fidgety/restless 0  0  Suicidal thoughts 3  0  PHQ-9 Score 24  0  Difficult doing work/chores Very difficult  Not difficult at all    Scribe for Treatment Team: Zachery Conch, LCSW 01/09/2022 11:55 AM

## 2022-01-10 DIAGNOSIS — F332 Major depressive disorder, recurrent severe without psychotic features: Secondary | ICD-10-CM | POA: Diagnosis not present

## 2022-01-10 LAB — CBC WITH DIFFERENTIAL/PLATELET
Abs Immature Granulocytes: 0.01 10*3/uL (ref 0.00–0.07)
Basophils Absolute: 0 10*3/uL (ref 0.0–0.1)
Basophils Relative: 1 %
Eosinophils Absolute: 0.1 10*3/uL (ref 0.0–0.5)
Eosinophils Relative: 1 %
HCT: 37.8 % (ref 36.0–46.0)
Hemoglobin: 12.1 g/dL (ref 12.0–15.0)
Immature Granulocytes: 0 %
Lymphocytes Relative: 27 %
Lymphs Abs: 1.8 10*3/uL (ref 0.7–4.0)
MCH: 27.8 pg (ref 26.0–34.0)
MCHC: 32 g/dL (ref 30.0–36.0)
MCV: 86.9 fL (ref 80.0–100.0)
Monocytes Absolute: 0.4 10*3/uL (ref 0.1–1.0)
Monocytes Relative: 6 %
Neutro Abs: 4.3 10*3/uL (ref 1.7–7.7)
Neutrophils Relative %: 65 %
Platelets: 637 10*3/uL — ABNORMAL HIGH (ref 150–400)
RBC: 4.35 MIL/uL (ref 3.87–5.11)
RDW: 17.2 % — ABNORMAL HIGH (ref 11.5–15.5)
WBC: 6.6 10*3/uL (ref 4.0–10.5)
nRBC: 0 % (ref 0.0–0.2)

## 2022-01-10 LAB — BASIC METABOLIC PANEL
Anion gap: 8 (ref 5–15)
BUN: 11 mg/dL (ref 6–20)
CO2: 23 mmol/L (ref 22–32)
Calcium: 9.3 mg/dL (ref 8.9–10.3)
Chloride: 104 mmol/L (ref 98–111)
Creatinine, Ser: 0.75 mg/dL (ref 0.44–1.00)
GFR, Estimated: >60 mL/min (ref >60)
Glucose, Bld: 90 mg/dL (ref 70–99)
Potassium: 4.2 mmol/L (ref 3.5–5.1)
Sodium: 135 mmol/L (ref 135–145)

## 2022-01-10 LAB — LIPID PANEL
Cholesterol: 168 mg/dL (ref 0–200)
HDL: 57 mg/dL (ref >40)
LDL Cholesterol: 100 mg/dL — ABNORMAL HIGH (ref 0–99)
Total CHOL/HDL Ratio: 2.9 RATIO
Triglycerides: 56 mg/dL (ref <150)
VLDL: 11 mg/dL (ref 0–40)

## 2022-01-10 LAB — HEMOGLOBIN A1C
Hgb A1c MFr Bld: 5.5 % (ref 4.8–5.6)
Mean Plasma Glucose: 111.15 mg/dL

## 2022-01-10 LAB — TSH: TSH: 0.812 u[IU]/mL (ref 0.350–4.500)

## 2022-01-10 MED ORDER — DIAZEPAM 5 MG PO TABS
5.0000 mg | ORAL_TABLET | Freq: Three times a day (TID) | ORAL | Status: DC | PRN
  Administered 2022-01-10 – 2022-01-11 (×2): 5 mg via ORAL
  Filled 2022-01-10 (×2): qty 1

## 2022-01-10 MED ORDER — LORAZEPAM 1 MG PO TABS: 1.0000 mg | ORAL_TABLET | Freq: Four times a day (QID) | ORAL | Status: DC | PRN

## 2022-01-10 MED ORDER — METHOCARBAMOL 500 MG PO TABS
500.0000 mg | ORAL_TABLET | Freq: Three times a day (TID) | ORAL | Status: DC | PRN
  Administered 2022-01-10 – 2022-01-15 (×7): 500 mg via ORAL
  Filled 2022-01-10 (×7): qty 1

## 2022-01-10 MED ORDER — LOPERAMIDE HCL 2 MG PO CAPS: 2.0000 mg | ORAL_CAPSULE | ORAL | Status: DC | PRN

## 2022-01-10 MED ORDER — LOPERAMIDE HCL 2 MG PO CAPS: 2.0000 mg | ORAL_CAPSULE | ORAL | Status: AC | PRN

## 2022-01-10 MED ORDER — DICYCLOMINE HCL 20 MG PO TABS
20.0000 mg | ORAL_TABLET | Freq: Four times a day (QID) | ORAL | Status: DC | PRN
  Administered 2022-01-10 – 2022-01-14 (×5): 20 mg via ORAL
  Filled 2022-01-10 (×5): qty 1

## 2022-01-10 MED ORDER — ONDANSETRON 4 MG PO TBDP
4.0000 mg | ORAL_TABLET | Freq: Four times a day (QID) | ORAL | Status: AC | PRN
  Administered 2022-01-12 (×2): 4 mg via ORAL
  Filled 2022-01-10 (×2): qty 1

## 2022-01-10 MED ORDER — ADULT MULTIVITAMIN W/MINERALS CH
1.0000 | ORAL_TABLET | Freq: Every day | ORAL | Status: DC
  Administered 2022-01-10 – 2022-01-15 (×6): 1 via ORAL
  Filled 2022-01-10 (×7): qty 1

## 2022-01-10 MED ORDER — VITAMIN B-1 100 MG PO TABS
100.0000 mg | ORAL_TABLET | Freq: Every day | ORAL | Status: DC
  Administered 2022-01-11 – 2022-01-15 (×5): 100 mg via ORAL
  Filled 2022-01-10 (×6): qty 1

## 2022-01-10 NOTE — Group Note (Signed)
Date:  01/10/2022 Time:  11:47 AM  Group Topic/Focus:  Orientation:   The focus of this group is to educate the patient on the purpose and policies of crisis stabilization and provide a format to answer questions about their admission.  The group details unit policies and expectations of patients while admitted.    Participation Level:  Minimal  Participation Quality:  Inattentive  Affect:  Appropriate  Cognitive:  Lacking  Insight: Good  Engagement in Group:  Lacking  Modes of Intervention:  Discussion  Additional Comments:     [name] 01/10/2022, 11:47 AM

## 2022-01-10 NOTE — Progress Notes (Signed)
Psychoeducational Group Note  Date:  01/10/2022 Time:  2043  Group Topic/Focus:  Wrap-Up Group:   The focus of this group is to help patients review their daily goal of treatment and discuss progress on daily workbooks.  Participation Level: Did Not Attend  Participation Quality:  Not Applicable  Affect:  Not Applicable  Cognitive:  Not Applicable  Insight:  Not Applicable  Engagement in Group: Not Applicable  Additional Comments:  The patient did not attend the evening group.   Gennette Pac 01/10/2022, 8:43 PM

## 2022-01-10 NOTE — [facility] Suicide Risk Assessment (Signed)
[place] INPATIENT:  Family/Significant Other Suicide Prevention Education  Suicide Prevention Education:  Education Completed;  Kathryn [name] [phone], has been identified by the patient as friend identified as the person who will aid the patient in the event of a mental health crisis (suicidal ideations/suicide attempt).  With written consent from the patient, the family member/significant other has been provided the following suicide prevention education, prior to the and/or following the discharge of the patient.  The suicide prevention education provided includes the following: Suicide risk factors Suicide prevention and interventions National Suicide Hotline telephone number [facility] assessment telephone number [place] and/or Residential Mobile Crisis Unit telephone number  Request made of family/significant other to: Call for Emergency assistance as indicated above  The friend verbalizes understanding of the suicide prevention education information provided.   [place] MSW, LCSW 01/10/2022, 12:03 PM

## 2022-01-10 NOTE — Progress Notes (Signed)
At 1614 patient c/o feeling anxious, agitated, cramping in the stomach,back pain, and tremors. Dr. Nelda Marseille notified- prn medications administered. Pt verbalizes she feels better.

## 2022-01-10 NOTE — Progress Notes (Addendum)
Kathryn [name]  01/10/2022 12:11 PM Kathryn [name]  MRN:  [id number]  Principal Problem: MDD (major depressive disorder), recurrent severe, without psychosis ([place]) Diagnosis: Principal Problem:   MDD (major depressive disorder), recurrent severe, without psychosis ([place]) Active Problems:   Hypertension   Chronic pain   Essential thrombocytosis ([place])   Severe stimulant use disorder ([place])   Reason for Admission: Kathryn [name] is a 51 y.o. female with a past psychiatric hx of schizoaffective d/o bipolar type vs substance induced psychosis, MDD, GAD, and polysubstance abuse, who presented to [facility] reporting increased depression and suicidal ideations with plan to cut her wrists. Now admitted to [facility] voluntarily for further treatment.This is hospitalization day 2.  Yesterday's Psychiatry Team's Recommendation: -- Continue Lurasidone 40 mg for depression -- Increase Duloxetine to 60 mg for depression  Subjective:  On evaluation today, the patient states that her depression is improving. She states that she was able to sleep well after getting Melatonin last night. She denies SI, HI, visual or auditory hallucinations. She denies ideas of reference, first rank symptoms, or paranoia. She clarifies that she has historically had AVH in the past both when using and when not using substances but denies any current psychotic symptoms. She reports no side effects to the medications that she has been receiving here, and she has no complaints related to withdrawal from her recent cocaine and alcohol use. She is interested in getting into an [employer] at time of discharge.  Objective:  Chart Review Past 24 hours of patient's chart was reviewed.  Patient is compliant with scheduled meds. Required PRNs: Atarax twice yesterday for anxiety. Melatonin last night for sleep. Per RN notes, no documented behavioral issues and is attending group. Patient slept, 8 hours  Total Time Spent in Direct Patient  Care:  I personally spent 30 minutes on the unit in direct patient care. The direct patient care time included face-to-face time with the patient, reviewing the patient's chart, communicating with other professionals, and coordinating care. Greater than 50% of this time was spent in counseling or coordinating care with the patient regarding goals of hospitalization, psycho-education, and discharge planning needs.   Past Psychiatric History: see H&P  Past Medical History:  Past Medical History:  Diagnosis Date   Anxiety    Arthritis    Asthma    Bipolar disorder ([place])    Chronic back pain    COPD (chronic obstructive pulmonary disease) (HCC)    Chronic bronchitis   Depression    Dyspnea    GERD (gastroesophageal reflux disease)    Hypertension    Migraine    Neuropathy    Osteoarthritis of left knee, 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   Single subsegmental pulmonary embolism without acute cor pulmonale ([place]) 06/20/2021   Sleep apnea 04/2020   GETTING A cpap   Suicidal ideation 12/24/2018    Past Surgical History:  Procedure Laterality Date   BACK SURGERY     DILATATION AND CURETTAGE/HYSTEROSCOPY WITH MINERVA N/A 06/09/2020   Procedure: DILATATION AND CURETTAGE/HYSTEROSCOPY WITH MINERVA;  Surgeon: Florian Buff, MD;  Location: [facility];  Service: Gynecology;  Laterality: N/A;   ESOPHAGOGASTRODUODENOSCOPY (EGD) WITH PROPOFOL N/A 12/25/2018   Procedure: ESOPHAGOGASTRODUODENOSCOPY (EGD) WITH PROPOFOL;  Surgeon: Rogene Houston, MD;  Location: [facility];  Service: Endoscopy;  Laterality: N/A;   FLEXIBLE SIGMOIDOSCOPY  10/14/2021   Procedure: FLEXIBLE SIGMOIDOSCOPY;  Surgeon: Harvel Quale, MD;  Location: [facility];  Service: Gastroenterology;;   PATELLA-FEMORAL ARTHROPLASTY Left 10/08/2018  Procedure: PATELLA-FEMORAL ARTHROPLASTY;  Surgeon: Marchia Bond, MD;  Location: [facility]S;  Service: Orthopedics;  Laterality: Left;   TUBAL LIGATION     Family History:  Family  History  Problem Relation Age of Onset   Gout Paternal Grandfather    Cirrhosis Paternal Grandfather    Hypertension Paternal Grandmother    Aneurysm Paternal Grandmother    Cirrhosis Maternal Grandmother    Cirrhosis Maternal Grandfather    Cancer Father    Cirrhosis Father    Cirrhosis Mother    Breast cancer Sister    Hypertension Sister    Bronchitis Daughter    Bronchitis Daughter    Asthma Son    Bronchitis Son    Migraines Neg Hx    Family Psychiatric  History: see H&P  Social History:  Social History   Substance and Sexual Activity  Alcohol Use Yes   Alcohol/week: 4.0 standard drinks of alcohol   Types: 4 Cans of beer per week   Comment: 4 beers Saturday     Social History   Substance and Sexual Activity  Drug Use Yes   Types: Cocaine   Comment: crack  last used June 2023    Social History   Socioeconomic History   Marital status: Single    Spouse name: Not on file   Number of children: 3   Years of education: 10   Highest education level: 10th grade  Occupational History   Not on file  Tobacco Use   Smoking status: Every Day    Packs/day: 1.00    Years: 10.00    Total pack years: 10.00    Types: Cigarettes    Passive exposure: Never   Smokeless tobacco: Never   Tobacco comments:    Smokes often, especially when using crack cocaine  Vaping Use   Vaping Use: Never used  Substance and Sexual Activity   Alcohol use: Yes    Alcohol/week: 4.0 standard drinks of alcohol    Types: 4 Cans of beer per week    Comment: 4 beers Saturday   Drug use: Yes    Types: Cocaine    Comment: crack  last used June 2023   Sexual activity: Yes    Birth control/protection: Surgical    Comment: tubal, ablation  Other Topics Concern   Not on file  Social History Narrative   R handed    Lives with boyfriend   1 Cup of caffeine daily    Social Determinants of Health   Financial Resource Strain: Not on file  Food Insecurity: Food Insecurity Present  (01/08/2022)   Hunger Vital Sign    Worried About Running Out of Food in the Last Year: Sometimes true    Ran Out of Food in the Last Year: Sometimes true  Transportation Needs: No Transportation Needs (01/08/2022)   PRAPARE - Hydrologist (Medical): No    Lack of Transportation (Non-Medical): No  Physical Activity: Not on file  Stress: Not on file  Social Connections: Not on file    Current Medications: Current Facility-Administered Medications  Medication Dose Route Frequency Provider Last Rate Last Admin   albuterol (VENTOLIN HFA) 108 (90 Base) MCG/ACT inhaler 1 puff  1 puff Inhalation Q6H PRN France Ravens, MD       alum & mag hydroxide-simeth (MAALOX/MYLANTA) 200-200-20 MG/5ML suspension 30 mL  30 mL Oral Q4H PRN France Ravens, MD       amLODipine (NORVASC) tablet 5 mg  5 mg Oral Daily  France Ravens, MD   5 mg at 01/10/22 0277   anagrelide (AGRYLIN) capsule 1 mg  1 mg Oral BID France Ravens, MD   1 mg at 01/10/22 0820   DULoxetine (CYMBALTA) DR capsule 60 mg  60 mg Oral Daily France Ravens, MD   60 mg at 01/10/22 0854   hydrOXYzine (ATARAX) tablet 50 mg  50 mg Oral Q6H PRN France Ravens, MD   50 mg at 01/09/22 2118   lidocaine (LIDODERM) 5 % 2 patch  2 patch Transdermal Daily France Ravens, MD   2 patch at 01/10/22 0820   lurasidone (LATUDA) tablet 40 mg  40 mg Oral Q breakfast France Ravens, MD   40 mg at 01/10/22 0820   magnesium hydroxide (MILK OF MAGNESIA) suspension 30 mL  30 mL Oral Daily PRN France Ravens, MD       melatonin tablet 5 mg  5 mg Oral QHS PRN France Ravens, MD   5 mg at 01/09/22 2118   mometasone-formoterol (DULERA) 100-5 MCG/ACT inhaler 2 puff  2 puff Inhalation BID France Ravens, MD   2 puff at 01/10/22 0820   nicotine (NICODERM CQ - dosed in mg/24 hours) patch 21 mg  21 mg Transdermal Daily Winfred Leeds, Nadir, MD   21 mg at 01/10/22 0855    Lab Results:  Results for orders placed or performed during the hospital encounter of 01/08/22 (from the past 24 hour(s))  CBC  with Differential/Platelet     Status: Abnormal   Collection Time: 01/10/22  6:37 AM  Result Value Ref Range   WBC 6.6 4.0 - 10.5 K/uL   RBC 4.35 3.87 - 5.11 MIL/uL   Hemoglobin 12.1 12.0 - 15.0 g/dL   HCT 37.8 36.0 - 46.0 %   MCV 86.9 80.0 - 100.0 fL   MCH 27.8 26.0 - 34.0 pg   MCHC 32.0 30.0 - 36.0 g/dL   RDW 17.2 (H) 11.5 - 15.5 %   Platelets 637 (H) 150 - 400 K/uL   nRBC 0.0 0.0 - 0.2 %   Neutrophils Relative % 65 %   Neutro Abs 4.3 1.7 - 7.7 K/uL   Lymphocytes Relative 27 %   Lymphs Abs 1.8 0.7 - 4.0 K/uL   Monocytes Relative 6 %   Monocytes Absolute 0.4 0.1 - 1.0 K/uL   Eosinophils Relative 1 %   Eosinophils Absolute 0.1 0.0 - 0.5 K/uL   Basophils Relative 1 %   Basophils Absolute 0.0 0.0 - 0.1 K/uL   Immature Granulocytes 0 %   Abs Immature Granulocytes 0.01 0.00 - 0.07 K/uL  Lipid panel     Status: Abnormal   Collection Time: 01/10/22  6:37 AM  Result Value Ref Range   Cholesterol 168 0 - 200 mg/dL   Triglycerides 56 <150 mg/dL   HDL 57 >40 mg/dL   Total CHOL/HDL Ratio 2.9 RATIO   VLDL 11 0 - 40 mg/dL   LDL Cholesterol 100 (H) 0 - 99 mg/dL  Basic metabolic panel     Status: None   Collection Time: 01/10/22  6:37 AM  Result Value Ref Range   Sodium 135 135 - 145 mmol/L   Potassium 4.2 3.5 - 5.1 mmol/L   Chloride 104 98 - 111 mmol/L   CO2 23 22 - 32 mmol/L   Glucose, Bld 90 70 - 99 mg/dL   BUN 11 6 - 20 mg/dL   Creatinine, Ser 0.75 0.44 - 1.00 mg/dL   Calcium 9.3 8.9 - 10.3 mg/dL  GFR, Estimated >60 >60 mL/min   Anion gap 8 5 - 15  Hemoglobin A1c     Status: None   Collection Time: 01/10/22  6:37 AM  Result Value Ref Range   Hgb A1c MFr Bld 5.5 4.8 - 5.6 %   Mean Plasma Glucose 111.15 mg/dL  TSH     Status: None   Collection Time: 01/10/22  6:37 AM  Result Value Ref Range   TSH 0.812 0.350 - 4.500 uIU/mL    Blood Alcohol level:  Lab Results  Component Value Date   ETH <10 01/06/2022   ETH <10 80/99/8338    Metabolic Disorder Labs: Lab Results   Component Value Date   HGBA1C 5.5 01/10/2022   MPG 111.15 01/10/2022   MPG 111.15 10/19/2021   No results found for: "PROLACTIN" Lab Results  Component Value Date   CHOL 168 01/10/2022   TRIG 56 01/10/2022   HDL 57 01/10/2022   CHOLHDL 2.9 01/10/2022   VLDL 11 01/10/2022   LDLCALC 100 (H) 01/10/2022   LDLCALC 84 10/19/2021    Physical Findings: AIMS: 0   Musculoskeletal: Strength & Muscle Tone: within normal limits Gait & Station: normal  Psychiatric Specialty Exam:  Presentation  General Appearance: Appropriate for Environment; Fairly Groomed   Eye Contact:Fair   Speech:Clear and Coherent   Speech Volume:Normal   Handedness:Right    Mood and Affect  Mood:described as improving - dysphoric   Affect:constricted    Thought Hydrographic surveyor, goal directed   Descriptions of Associations:Intact   Orientation:Full (Time, Place and Person)   Thought Content:Denies SI, HI, AVH, paranoia, ideas of reference or first rank symptoms  Hallucinations:Denied  Ideas of Reference:None   Suicidal Thoughts:Denied Homicidal Thoughts:Denied  Sensorium  Memory:Immediate Fair; Recent Fair   Judgment:Fair   Insight:Fair    Executive Functions  Concentration:Fair   Attention Span:Fair   [place]    Psychomotor Activity  Psychomotor Activity:Normal - no akathisias, no restlessness  Assets  Assets:Resilience; Armed forces logistics/support/administrative officer   Physical Exam: Blood pressure 123/86, pulse 89, temperature 98.5 F (36.9 C), temperature source Oral, resp. rate 20, height '4\' 11"'$  (1.499 m), weight 85.3 kg, last menstrual period 06/07/2020, SpO2 99 %. Body mass index is 37.97 kg/m.  Physical Exam Vitals and nursing [name] reviewed.  HENT:     Head: Normocephalic.  Pulmonary:     Effort: Pulmonary effort is normal.  Musculoskeletal:     Comments: No LE pitting edema noted bilaterally   Skin:    General: Skin is warm and dry.  Neurological:     Mental Status: She is alert and oriented to person, place, and time.    ASSESSMENT AND PLAN Kathryn [name] is a 51 y.o. female with a conflicting past psychiatric history of schizoaffective d/o vs substance induced psychosis, vs bipolar d/o, vs MDD in the past. Her substance use confounds a diagnosis at this time. Presently she reports no psychotic symptoms but wishes to remain on Latuda stating that she has a h/o AVH and paranoia in the past both when using drugs and off of drugs remotely. She feels mood improved on current dose increase in Cymbalta.   PLAN Safety and Monitoring: Voluntary admission to inpatient psychiatric unit for safety, stabilization and treatment Daily contact with patient to assess and evaluate symptoms and progress in treatment Patient's case to be discussed in multi-disciplinary team meeting Observation Level : q15 minute checks Vital signs: q12 hours Precautions: suicide,  elopement, and assault   Psychiatric Diagnoses and Treatment  MDD-Severe, recurrent episode without psychosis (r/o schizoaffective d/o by hx, r/o substance induced mood d/o)             -- Continue Lurasidone 40 mg for mood stabilization and given past h/o psychosis             -- Continue Duloxetine 60 mg for depression symptoms -- The risks/benefits/side-effects/alternatives to these medications were discussed in detail with the patient and time was given for questions. The patient consents to medication trials.              -- Metabolic profile and EKG monitoring obtained while on an atypical antipsychotic (BMI: 37.97 Lipid Panel: LDL 100, otw WNL HbgA1c: 5.5; QTc: 440)              -- Encouraged patient to participate in unit milieu and in scheduled group therapies    Stimulant use disorder-cocaine Nicotine use disorder            -- Monitoring for signs of withdrawal and will start CIWA given report of alcohol use prior to  admission  -- MVI and thiamine oral replacement  -- Counseled on need to abstain from substance use and patient interested in [facility] at discharge  Medical Problems Being Addressed Hx of DVT/PE in May 2022 Completed treatment regimen. No new acute symptoms. Last admission in July 2023, IM was consulted and recommended to NOT resume eliquis as no indication for it. She was resumed on eliquis in ED for no clear reason.  -Monitor for new unilateral sharp pain with associated swelling. These are not present today.   Chronic pain secondary to spondylolisthesis s/p lumbar fusion May 2022 Associated neuropathy -Previously was on pregabalin but switched to gabapentin due to abuse potential. Patient refused gabapentin.   Hypertension BP 108/74 -Amlodipine 5 mg daily -Clonidine was discontinued as patient has not been taking and BP wnl   Essential thrombocytosis Follows with Dr. Lindi Adie (last seen 09/2021, recommended continuing anagrelide with followup in ~3 months) - Continue anagrelide 1 mg bid - Platelets 637 today - OP follow up with Dr. Lindi Adie   BLE edema, stable Takes Lasix PRN for leg swelling and was only scheduled on this in ED  -Discontinue lasix -Outpatient PCP follow up  PRNs Tylenol 650 mg for mild pain Maalox/Mylanta 30 mL for indigestion Hydroxyzine 25 mg tid for anxiety Milk of Magnesia 30 mL for constipation Trazodone 50 mg for sleep   4. Discharge Planning: Social work and case management to assist with discharge planning and identification of hospital follow-up needs prior to discharge Estimated LOS: 3-5 days Discharge Concerns: Need to establish a safety plan; Medication compliance and effectiveness Discharge Goals: Return home with outpatient referrals for mental health follow-up including medication management/psychotherapy  Kathryn [name], MS3 01/10/2022, 12:11 PM   Attestation for Student Documentation:  I certify that I saw and interviewed the patient  together with the medical student and was present for the duration of the interview.  I reviewed the medical record.  I performed or reperformed the mental status examination of the patient as indicated.  I formulated the assessment and plan of treatment as documented above with edits. Viann Fish, MD, Alda Ponder

## 2022-01-10 NOTE — Plan of Care (Signed)
  Problem: Education: Goal: Knowledge of the prescribed therapeutic regimen will improve Outcome: Progressing   Problem: Education: Goal: Ability to make informed decisions regarding treatment will improve Outcome: Progressing   Problem: Education: Goal: Utilization of techniques to improve thought processes will improve Outcome: Progressing   Problem: Education: Goal: Ability to state activities that reduce stress will improve Outcome: Progressing  Patient isolative to her room C/O homelessness and substance abuse. Denies SI/HI/A/VH and verbally contracted for safety. Support and encouragement provided.  Q 15 minutes safety checks ongoing Patient remains safe.

## 2022-01-10 NOTE — Plan of Care (Signed)
Pt alert,  verbal and able to make needs known. Pt denies any thoughts of  SI/HI/AVH at present. She did not complain of any pain this morning. She contracts verbally for safety and she takes her medication as ordered. She did request Lyrica this morning for her leg pain, provider was notified.    Problem: Education: Goal: Ability to state activities that reduce stress will improve Outcome: Progressing   Problem: Education: Goal: Utilization of techniques to improve thought processes will improve Outcome: Progressing Goal: Knowledge of the prescribed therapeutic regimen will improve Outcome: Progressing   Problem: Education: Goal: Emotional status will improve Outcome: Progressing Goal: Mental status will improve Outcome: Progressing

## 2022-01-10 NOTE — Group Note (Signed)
Recreation Therapy Group Note   Group Topic:Animal Assisted Therapy   Group Date: 01/10/2022 Start Time: 1430 End Time: 1515 Facilitators: Macoy Rodwell-McCall, LRT,CTRS Location: [address]   Animal-Assisted Activity (AAA) Program Checklist/Progress Notes Patient Eligibility Criteria Checklist & Daily Group note for Rec Tx Intervention  AAA/T Program Assumption of Risk Form signed by Patient/ or Parent Legal Guardian Yes  Patient understands his/her participation is voluntary Yes   Affect/Mood: N/A   Participation Level: Did not attend    Clinical Observations/Individualized Feedback:     Plan: Continue to engage patient in RT group sessions 2-3x/week.   Brent Noto-McCall, LRT,CTRS 01/10/2022 3:40 PM

## 2022-01-11 DIAGNOSIS — F332 Major depressive disorder, recurrent severe without psychotic features: Secondary | ICD-10-CM | POA: Diagnosis not present

## 2022-01-11 MED ORDER — DIAZEPAM 5 MG PO TABS: 2.5000 mg | ORAL_TABLET | Freq: Three times a day (TID) | ORAL | Status: DC | PRN

## 2022-01-11 MED ORDER — SENNOSIDES-DOCUSATE SODIUM 8.6-50 MG PO TABS
1.0000 | ORAL_TABLET | Freq: Two times a day (BID) | ORAL | Status: DC
  Administered 2022-01-11 – 2022-01-15 (×9): 1 via ORAL
  Filled 2022-01-11 (×12): qty 1

## 2022-01-11 MED ORDER — LORAZEPAM 1 MG PO TABS
1.0000 mg | ORAL_TABLET | Freq: Two times a day (BID) | ORAL | Status: DC | PRN
  Administered 2022-01-11 – 2022-01-12 (×2): 1 mg via ORAL
  Filled 2022-01-11 (×2): qty 1

## 2022-01-11 NOTE — Group Note (Signed)
Recreation Therapy Group Note   Group Topic:Other  Group Date: 01/11/2022 Start Time: 1030 End Time: 1430 Facilitators: Jonmichael Beadnell-McCall, LRT,CTRS Location: [address]   Activity Description/Intervention: Therapeutic Drumming. Patients, with peers and staff, were given the opportunity to engage in a leader facilitated [place] with staff from the [employer], in partnership with [employer].    Affect/Mood: Appropriate   Participation Level: Engaged   Participation Quality: Independent   Behavior: Appropriate   Speech/Thought Process: Focused   Insight: Good   Judgement: Good   Modes of Intervention: Music   Patient Response to Interventions:  Engaged   Education Outcome:  Acknowledges education and In group clarification offered    Clinical Observations/Individualized Feedback: Pt was bright and engaged throughout group session.  Pt was focused on keeping up with instructor while playing drums during group session.     Plan: Continue to engage patient in RT group sessions 2-3x/week.   Ojas Coone-McCall, LRT,CTRS 01/11/2022 3:48 PM

## 2022-01-11 NOTE — Progress Notes (Signed)
D. Pt presented friendly, but moderately anxious with complaints of withdrawal symptoms ( cramping, visible tremors, sweating). Pt compliant with meds, and observed attending group this am. Pt currently denies SI/HI and AVH  A. Labs and vitals monitored. Pt given scheduled meds, and prn meds for withdrawal symptoms.  Pt supported emotionally and encouraged to express concerns and ask questions.   R. Pt remains safe with 15 minute checks. Will continue POC.

## 2022-01-11 NOTE — Group Note (Signed)
Recreation Therapy Group Note   Group Topic:Team Building  Group Date: 01/11/2022 Start Time: 0935 End Time: 1010 Facilitators: Shakeisha Horine-McCall, LRT,CTRS Location: [address]   Goal Area(s) Addresses:  Patient will effectively work with peer towards shared goal.  Patient will identify skills used to make activity successful.  Patient will identify how skills used during activity can be used to reach post d/c goals.   Group Description: Straw Bridge. In teams of 3-5, patients were given 15 plastic drinking straws and an equal length of masking tape. Using the materials provided, patients were instructed to build a free standing bridge-like structure to suspend an everyday item (ex: puzzle box) off of the floor or table surface. All materials were required to be used by the team in their design. LRT facilitated post-activity discussion reviewing team process. Patients were encouraged to reflect how the skills used in this activity can be generalized to daily life post discharge.   Affect/Mood: Appropriate   Participation Level: None   Participation Quality: Independent   Behavior: Attentive    Speech/Thought Process: Focused   Insight: Good   Judgement: Good   Modes of Intervention: STEM Activity   Patient Response to Interventions:  Attentive   Education Outcome:  Acknowledges education and In group clarification offered    Clinical Observations/Individualized Feedback: Pt came in half way through group session.  Pt did not participate in activity.  Pt watched peers as they worked on their bridges.     Plan: Continue to engage patient in RT group sessions 2-3x/week.   Samone Guhl-McCall, LRT,CTRS 01/11/2022 1:16 PM

## 2022-01-11 NOTE — Group Note (Signed)
LCSW Group Therapy Note  Group Date: 01/11/2022 Start Time: 1300 End Time: 1400   Type of Therapy and Topic:  Group Therapy - Healthy vs Unhealthy Coping Skills  Participation Level:  Did Not Attend   Description of Group The focus of this group was to determine what unhealthy coping techniques typically are used by group members and what healthy coping techniques would be helpful in coping with various problems. Patients were guided in becoming aware of the differences between healthy and unhealthy coping techniques. Patients were asked to identify 2-3 healthy coping skills they would like to learn to use more effectively.  Therapeutic Goals Patients learned that coping is what human beings do all day long to deal with various situations in their lives Patients defined and discussed healthy vs unhealthy coping techniques Patients identified their preferred coping techniques and identified whether these were healthy or unhealthy Patients determined 2-3 healthy coping skills they would like to become more familiar with and use more often. Patients provided support and ideas to each other   Windle Guard, LCSW 01/11/2022  4:17 PM

## 2022-01-11 NOTE — Progress Notes (Addendum)
[facility] MD Progress Note  01/11/2022 10:22 AM Kathryn [name]  MRN:  [id number]  Principal Problem: MDD (major depressive disorder), recurrent severe, without psychosis ([place]) Diagnosis: Principal Problem:   MDD (major depressive disorder), recurrent severe, without psychosis ([place]) Active Problems:   Hypertension   Chronic pain   Essential thrombocytosis ([place])   Severe stimulant use disorder ([place])  Reason for Admission: Kathryn [name] is a 51 y.o. female with a past psychiatric hx of schizoaffective d/o bipolar type vs substance induced psychosis, MDD, GAD, and polysubstance abuse, who presented to [facility] reporting increased depression and suicidal ideations with plan to cut her wrists. Now admitted to [facility] voluntarily for further treatment.This is hospitalization day 3.  Chart Review from last 24 hours:  Past 24 hours of patient's chart was reviewed.  Patient is compliant with scheduled meds. Required PRNs: Atarax twice yesterday for anxiety. Melatonin last night for sleep. Valium yesterday afternoon and this morning for withdrawal symptoms based on CIWA scores of 10 and 12.Bentyl X1 for cramps, and Robaxin X1 for spasms yesterday PRN. Per RN notes, no documented behavioral issues and is attending select groups. Patient slept, 8 hours  Yesterday's Psychiatry Team's Recommendation: -- Continue Lurasidone 40 mg for depression -- Continue Duloxetine to 60 mg for depression  Subjective:  On evaluation today, the patient states that her depression is improving. She states that she was able to sleep well after getting Melatonin last night. She denies SI, HI, visual or auditory hallucinations. She denies ideas of reference, first rank symptoms, or paranoia. She clarifies that she has historically had AVH in the past both when using and when not using substances but denies any current psychotic symptoms. In addition, she states that she has had manic episodes in the past, even in the absence of drug use  or withdrawal.She reports no side effects to the medications that she has been receiving here. She does endorse some cramping and sweating that she feels is related to withdrawal from her substance use, but she denies nausea, vomiting, diarrhea, chest pain, shortness of breath. She has had some constipation and we discussed using MOM and senokot for constipation and increasing fluid and fiber. She has taken Valium yesterday evening and tonight for these symptoms, which she states has been helping. She is interested in getting into an [facility] at time of discharge, and she states that she has tried to call two different places, but did not get a response. She said that she will try to call them again today in addition to calling other places in hopes of being accepted into an [facility]. We discussed that SW will also assist with [facility] or other residential rehab referrals.  Total Time Spent in Direct Patient Care:  I personally spent 30 minutes on the unit in direct patient care. The direct patient care time included face-to-face time with the patient, reviewing the patient's chart, communicating with other professionals, and coordinating care. Greater than 50% of this time was spent in counseling or coordinating care with the patient regarding goals of hospitalization, psycho-education, and discharge planning needs.  Past Psychiatric History: see H&P  Past Medical History:  Past Medical History:  Diagnosis Date   Anxiety    Arthritis    Asthma    Bipolar disorder ([place])    Chronic back pain    COPD (chronic obstructive pulmonary disease) (HCC)    Chronic bronchitis   Depression    Dyspnea    GERD (gastroesophageal reflux disease)  Hypertension    Migraine    Neuropathy    Osteoarthritis of left knee, 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   Single subsegmental pulmonary embolism without acute cor pulmonale ([place]) 06/20/2021   Sleep apnea 04/2020   GETTING A cpap   Suicidal ideation 12/24/2018     Past Surgical History:  Procedure Laterality Date   BACK SURGERY     DILATATION AND CURETTAGE/HYSTEROSCOPY WITH MINERVA N/A 06/09/2020   Procedure: DILATATION AND CURETTAGE/HYSTEROSCOPY WITH MINERVA;  Surgeon: Florian Buff, MD;  Location: [facility];  Service: Gynecology;  Laterality: N/A;   ESOPHAGOGASTRODUODENOSCOPY (EGD) WITH PROPOFOL N/A 12/25/2018   Procedure: ESOPHAGOGASTRODUODENOSCOPY (EGD) WITH PROPOFOL;  Surgeon: Rogene Houston, MD;  Location: [facility];  Service: Endoscopy;  Laterality: N/A;   FLEXIBLE SIGMOIDOSCOPY  10/14/2021   Procedure: FLEXIBLE SIGMOIDOSCOPY;  Surgeon: Harvel Quale, MD;  Location: [facility];  Service: Gastroenterology;;   PATELLA-FEMORAL ARTHROPLASTY Left 10/08/2018   Procedure: PATELLA-FEMORAL ARTHROPLASTY;  Surgeon: Marchia Bond, MD;  Location: [facility];  Service: Orthopedics;  Laterality: Left;   TUBAL LIGATION     Family History:  Family History  Problem Relation Age of Onset   Gout Paternal Grandfather    Cirrhosis Paternal Grandfather    Hypertension Paternal Grandmother    Aneurysm Paternal Grandmother    Cirrhosis Maternal Grandmother    Cirrhosis Maternal Grandfather    Cancer Father    Cirrhosis Father    Cirrhosis Mother    Breast cancer Sister    Hypertension Sister    Bronchitis Daughter    Bronchitis Daughter    Asthma Son    Bronchitis Son    Migraines Neg Hx    Family Psychiatric  History: see H&P  Social History:  Social History   Substance and Sexual Activity  Alcohol Use Yes   Alcohol/week: 4.0 standard drinks of alcohol   Types: 4 Cans of beer per week   Comment: 4 beers Saturday     Social History   Substance and Sexual Activity  Drug Use Yes   Types: Cocaine   Comment: crack  last used June 2023    Social History   Socioeconomic History   Marital status: Single    Spouse name: Not on file   Number of children: 3   Years of education: 10   Highest education level: 10th grade   Occupational History   Not on file  Tobacco Use   Smoking status: Every Day    Packs/day: 1.00    Years: 10.00    Total pack years: 10.00    Types: Cigarettes    Passive exposure: Never   Smokeless tobacco: Never   Tobacco comments:    Smokes often, especially when using crack cocaine  Vaping Use   Vaping Use: Never used  Substance and Sexual Activity   Alcohol use: Yes    Alcohol/week: 4.0 standard drinks of alcohol    Types: 4 Cans of beer per week    Comment: 4 beers Saturday   Drug use: Yes    Types: Cocaine    Comment: crack  last used June 2023   Sexual activity: Yes    Birth control/protection: Surgical    Comment: tubal, ablation  Other Topics Concern   Not on file  Social History Narrative   R handed    Lives with boyfriend   1 Cup of caffeine daily    Social Determinants of Health   Financial Resource Strain: Not on file  Food Insecurity: Food Insecurity Present (  01/08/2022)   Hunger Vital Sign    Worried About Running Out of Food in the Last Year: Sometimes true    Ran Out of Food in the Last Year: Sometimes true  Transportation Needs: No Transportation Needs (01/08/2022)   PRAPARE - Hydrologist (Medical): No    Lack of Transportation (Non-Medical): No  Physical Activity: Not on file  Stress: Not on file  Social Connections: Not on file    Current Medications: Current Facility-Administered Medications  Medication Dose Route Frequency Provider Last Rate Last Admin   albuterol (VENTOLIN HFA) 108 (90 Base) MCG/ACT inhaler 1 puff  1 puff Inhalation Q6H PRN France Ravens, MD       alum & mag hydroxide-simeth (MAALOX/MYLANTA) 200-200-20 MG/5ML suspension 30 mL  30 mL Oral Q4H PRN France Ravens, MD   30 mL at 01/11/22 0948   amLODipine (NORVASC) tablet 5 mg  5 mg Oral Daily France Ravens, MD   5 mg at 01/11/22 0946   anagrelide (AGRYLIN) capsule 1 mg  1 mg Oral BID France Ravens, MD   1 mg at 01/11/22 0946   diazepam (VALIUM) tablet 5 mg  5  mg Oral Q8H PRN Harlow Asa, MD   5 mg at 01/11/22 0946   dicyclomine (BENTYL) tablet 20 mg  20 mg Oral Q6H PRN Harlow Asa, MD   20 mg at 01/11/22 0946   DULoxetine (CYMBALTA) DR capsule 60 mg  60 mg Oral Daily France Ravens, MD   60 mg at 01/11/22 0944   hydrOXYzine (ATARAX) tablet 50 mg  50 mg Oral Q6H PRN France Ravens, MD   50 mg at 01/10/22 2138   lidocaine (LIDODERM) 5 % 2 patch  2 patch Transdermal Daily France Ravens, MD   2 patch at 01/11/22 0949   loperamide (IMODIUM) capsule 2-4 mg  2-4 mg Oral PRN Harlow Asa, MD       lurasidone (LATUDA) tablet 40 mg  40 mg Oral Q breakfast France Ravens, MD   40 mg at 01/11/22 0947   magnesium hydroxide (MILK OF MAGNESIA) suspension 30 mL  30 mL Oral Daily PRN France Ravens, MD   30 mL at 01/11/22 0942   melatonin tablet 5 mg  5 mg Oral QHS PRN France Ravens, MD   5 mg at 01/10/22 2138   methocarbamol (ROBAXIN) tablet 500 mg  500 mg Oral Q8H PRN Harlow Asa, MD   500 mg at 01/11/22 0944   mometasone-formoterol (DULERA) 100-5 MCG/ACT inhaler 2 puff  2 puff Inhalation BID France Ravens, MD   2 puff at 01/11/22 0947   multivitamin with minerals tablet 1 tablet  1 tablet Oral Daily Harlow Asa, MD   1 tablet at 01/11/22 0946   nicotine (NICODERM CQ - dosed in mg/24 hours) patch 21 mg  21 mg Transdermal Daily Attiah, Nadir, MD   21 mg at 01/11/22 0944   ondansetron (ZOFRAN-ODT) disintegrating tablet 4 mg  4 mg Oral Q6H PRN Harlow Asa, MD       senna-docusate (Senokot-S) tablet 1 tablet  1 tablet Oral BID Viann Fish E, MD       thiamine (Vitamin B-1) tablet 100 mg  100 mg Oral Daily Viann Fish E, MD   100 mg at 01/11/22 2353    Lab Results:  No results found for this or any previous visit (from the past 24 hour(s)).   Blood Alcohol level:  Lab Results  Component Value  Date   ETH <10 01/06/2022   ETH <10 25/85/2778    Metabolic Disorder Labs: Lab Results  Component Value Date   HGBA1C 5.5 01/10/2022   MPG 111.15 01/10/2022    MPG 111.15 10/19/2021   No results found for: "PROLACTIN" Lab Results  Component Value Date   CHOL 168 01/10/2022   TRIG 56 01/10/2022   HDL 57 01/10/2022   CHOLHDL 2.9 01/10/2022   VLDL 11 01/10/2022   LDLCALC 100 (H) 01/10/2022   LDLCALC 84 10/19/2021    Physical Findings: AIMS: 0   Musculoskeletal: Strength & Muscle Tone: within normal limits Gait & Station: normal  Psychiatric Specialty Exam:  Presentation  General Appearance: Appropriate for Environment; Fairly Groomed   Eye Contact:Fair   Speech:Clear and Coherent   Speech Volume:Normal  Mood and Affect  Mood:described as improving - less anxious and less dysphoric today   Affect:constricted    Thought Process  Thought Processes:linear, goal directed   Descriptions of Associations:Intact   Orientation:Full (Time, Place and Person)   Thought Content:Denies SI, HI, AVH, paranoia, ideas of reference or first rank symptoms - is not grossly responding to internal/external stimuli on exam  Hallucinations:Denied  Ideas of Reference:None   Suicidal Thoughts:Denied Homicidal Thoughts:Denied  Sensorium  Memory:Immediate Fair; Recent Fair   Judgment:Fair   Insight:Fair    Executive Functions  Concentration:Fair   Attention Span:Fair   [place]    Psychomotor Activity  Psychomotor Activity:Normal - no akathisias, no restlessness, fine resting hand tremors bilaterally; AIMS 0, no cogwheeling or stiffness  Assets  Assets:Resilience; Communication Skills   Physical Exam: Blood pressure 128/83, pulse (!) 101, temperature 98.6 F (37 C), temperature source Oral, resp. rate 16, height '4\' 11"'$  (1.499 m), weight 85.3 kg, last menstrual period 06/07/2020, SpO2 100 %. Body mass index is 37.97 kg/m.  Physical Exam Vitals and nursing note reviewed.  HENT:     Head: Normocephalic.  Pulmonary:     Effort: Pulmonary effort is normal.   Neurological:     General: No focal deficit present.     Mental Status: She is alert and oriented to person, place, and time.     Motor: No tremor.   Review of Systems  Constitutional:  Positive for diaphoresis. Negative for chills.  Respiratory:  Negative for shortness of breath.   Cardiovascular:  Negative for chest pain.  Gastrointestinal:  Positive for constipation. Negative for diarrhea, nausea and vomiting.  Neurological:  Positive for tremors. Negative for dizziness and headaches.    ASSESSMENT AND PLAN Kathryn [name] is a 51 y.o. female with a conflicting past psychiatric history of schizoaffective d/o vs substance induced psychosis, vs bipolar d/o, vs MDD in the past. Today, she reported that she has had hallucinations in the past, even in the absence of drug use or drug withdrawal. In addition, she states that she has had manic episodes in the past, even in the absence of drug use or withdrawal. This history contradicts that given to admitting team. We discussed that her concomitant substance use history makes a diagnosis difficult. This admission, she has presented with depressive symptoms in the context of substance use but without psychosis. Given concerns for a previous schizoaffective and bipolar diagnosis and given report of previous psychosis history, she wishes to remain on present dose of Latuda. She feels mood improved on current dose increase in Cymbalta and would like to remain on this.  PLAN Safety and Monitoring: Voluntary admission to  inpatient psychiatric unit for safety, stabilization and treatment Daily contact with patient to assess and evaluate symptoms and progress in treatment Patient's case to be discussed in multi-disciplinary team meeting Observation Level : q15 minute checks Vital signs: q12 hours Precautions: suicide, elopement, and assault   Psychiatric Diagnoses and Treatment  MDD-Severe, recurrent episode without psychosis (r/o schizoaffective d/o  by hx, r/o substance induced mood d/o)             -- Continue Latuda 40 mg for mood stabilization and given past h/o psychosis Metabolic profile and EKG monitoring obtained while on an atypical antipsychotic (BMI: 37.97 Lipid Panel: LDL 100, otw WNL HbgA1c: 5.5; QTc: 440)              -- Continue Duloxetine 60 mg for depression symptoms  -- Continue melatonin '5mg'$  qhs PRN insomnia -- The risks/benefits/side-effects/alternatives to these medications were discussed in detail with the patient and time was given for questions. The patient consents to medication trials.              -- Encouraged patient to participate in unit milieu and in scheduled group therapies   Stimulant use disorder-cocaine R/o alcohol use disorder Nicotine use disorder            -- Monitoring for signs of withdrawal and will continue CIWA given report of alcohol use prior to admission. Reduce Valium to 2.'5mg'$  for scores >10 and attempting to clarify her true allergy to previous benzodiazepines in the hope we can transition to standard Ativan for CIWA protocol  -- MVI and thiamine oral replacement  -- PRNs available for symptomatic withdrawal  -- Counseled on need to abstain from substance use and patient interested in [facility] - SW also to assist with referrals to [facility] or residential rehab facilities  Medical Problems Being Addressed Hx of DVT/PE in May 2022 Completed treatment regimen. No new acute symptoms. Last admission in July 2023, IM was consulted and recommended to NOT resume eliquis as no indication for it. She was resumed on eliquis in ED for no clear reason.  -Monitor for new unilateral sharp pain with associated swelling. These are not present today.   Chronic pain secondary to spondylolisthesis s/p lumbar fusion May 2022 Associated neuropathy -Previously was on pregabalin but switched to gabapentin due to abuse potential. Patient refused gabapentin.  - Lidoderm patch daily for  pain  Hypertension BP 108/74 -Amlodipine 5 mg daily -Clonidine was discontinued as patient has not been taking and BP wnl   Essential thrombocytosis Follows with Dr. Lindi Adie (last seen 09/2021, recommended continuing anagrelide with followup in ~3 months) - Continue anagrelide 1 mg bid - Platelets 637 today - OP follow up with Dr. Lindi Adie   BLE edema, stable -Discontinue lasix and monitoring for sx -Outpatient PCP follow up  Constipation -- Encouraged PRN MOM and start Senokot S tab bid  Nicotine use d/o -- counseled on smoking cessation -- Nicotine patch '21mg'$ /24 hours daily   COPD -- continue Albuterol MDI PRN -- Continue Dulera 2 puffs daily  4. Discharge Planning: Social work and case management to assist with discharge planning and identification of hospital follow-up needs prior to discharge Estimated LOS: 5-7 days Discharge Concerns: Need to establish a safety plan; Medication compliance and effectiveness Discharge Goals: Return home with outpatient referrals for mental health follow-up including medication management/psychotherapy. Patient would like to go to [place] and is calling to find placement.  Elane Fritz, MS3 01/11/2022, 10:22 AM  Attestation for  Student Documentation:   I certify that I saw and interviewed the patient together with the medical student and was present for the duration of the interview.  I reviewed the medical record.  I performed or reperformed the mental status examination of the patient as indicated.  I formulated the assessment and plan of treatment as documented above with edits. Viann Fish, MD, Alda Ponder

## 2022-01-11 NOTE — Plan of Care (Signed)
Discussed with patient whether she was actually allergic to Klonopin.  Patient stated "I thought this was the same as Seroquel.  I do not have an allergy to Klonopin or Ativan.  I am going to go up there right now to get Ativan as I am experiencing lower back pain and shoulder pain and leg pain."  When asked about actual alcohol withdrawal symptoms, patient insists that her lower back pain is a sign of alcohol withdrawal.  Plan: -Switch Valium to Ativan 1 mg twice daily as needed for CIWA greater than 10

## 2022-01-12 DIAGNOSIS — F332 Major depressive disorder, recurrent severe without psychotic features: Secondary | ICD-10-CM | POA: Diagnosis not present

## 2022-01-12 MED ORDER — PANTOPRAZOLE SODIUM 40 MG PO TBEC
40.0000 mg | DELAYED_RELEASE_TABLET | Freq: Every day | ORAL | Status: DC
  Administered 2022-01-12 – 2022-01-15 (×4): 40 mg via ORAL
  Filled 2022-01-12 (×5): qty 1

## 2022-01-12 MED ORDER — CALCIUM CARBONATE ANTACID 500 MG PO CHEW
1.0000 | CHEWABLE_TABLET | Freq: Two times a day (BID) | ORAL | Status: DC | PRN
  Administered 2022-01-12 – 2022-01-14 (×3): 200 mg via ORAL
  Filled 2022-01-12 (×3): qty 1

## 2022-01-12 NOTE — Progress Notes (Signed)
CSW contact [facility] in [place], [place]. Spoke with intake coordinator concerning patient and was informed that the patient had previously attempted to receive treat and was deemed "inappropriate" due to her being diagnosed  with Schizoaffective Disorder. CSW discussed with Dr. Nelda Marseille, who informed me that pt no longer has diagnosis. Will continue to inquire about acceptance to treatment.

## 2022-01-12 NOTE — Progress Notes (Addendum)
[facility] MD Progress Note  01/12/2022 9:25 AM Kathryn [name]  MRN:  [id number]  Principal Problem: MDD (major depressive disorder), recurrent severe, without psychosis ([place]) Diagnosis: Principal Problem:   MDD (major depressive disorder), recurrent severe, without psychosis ([place]) Active Problems:   Hypertension   Chronic pain   Essential thrombocytosis ([place])   Severe stimulant use disorder ([place])  Reason for Admission: Kathryn [name] is a 51 y.o. female with a past psychiatric hx of schizoaffective d/o bipolar type vs substance induced psychosis, MDD, GAD, and polysubstance abuse, who presented to [facility] reporting increased depression and suicidal ideations with plan to cut her wrists. Now admitted to [facility] voluntarily for further treatment.This is hospitalization day 4.  Chart Review from last 24 hours:  Past 24 hours of patient's chart was reviewed. Patient is compliant with scheduled meds. Required PRNs: Atarax twice yesterday for anxiety., Maalox X2 for GERD, Valium X1 yesterday for CIWA score 12, Ativan X1 for CIWA score 11,  and Ativan X1 for CIWA 11 overnight; Bentyl X1 yesterday and X1 today for cramps, Vistaril X2 yesterday an X1 today for anxiety, MOM X1 for constipation, Melatonin X1 for sleep, and Robaxin X1 yesterday and X1 today for spasms, and Zofran X1 today for nausea.  Per RN notes, no documented behavioral issues and is attending select groups. Patient slept, 8 hours  Yesterday's Psychiatry Team's Recommendation: -- Continue Latuda 40 mg for mood stabilization and given past h/o psychosis -- Continue Cymbalta 60 mg for depression  Subjective:  On evaluation today, the patient states that her depression is improving. She states that she did not sleep well last night compared to previous nights. She denies SI, HI, visual or auditory hallucinations. She denies ideas of reference, first rank symptoms, or paranoia. She complains of an intermittent tremor, some cramping and sweating  that she feels is related to withdrawal from her substance use. It appears that the majority of the patient's CIWA scores are  coming from subjective complaints, as she does not have tachycardia or hypertension today and no physical signs of withdrawal when observed on exam. Specifically she has no noticeable tremor during interview today until she is questioned about withdrawal at which time she starts physically shaking her hands. We informed the patient that she would not be able to receive Ativan or any other controlled substance unless based on HR and BP scores, to avoid use of controlled substances given her addiction history. She reports no side effects to the medications that she has been receiving here. She denies nausea, vomiting, diarrhea, chest pain, shortness of breat but reports issues with GERD. She is interested in getting into an [facility] at time of discharge, and she states that she has a call scheduled with them today at 5 pm. We discussed that SW is also assisting with SLA and other residential rehab referrals.  Total Time Spent in Direct Patient Care:  I personally spent 30 minutes on the unit in direct patient care. The direct patient care time included face-to-face time with the patient, reviewing the patient's chart, communicating with other professionals, and coordinating care. Greater than 50% of this time was spent in counseling or coordinating care with the patient regarding goals of hospitalization, psycho-education, and discharge planning needs.  Past Psychiatric History: see H&P  Past Medical History:  Past Medical History:  Diagnosis Date   Anxiety    Arthritis    Asthma    Bipolar disorder ([place])    Chronic back pain  COPD (chronic obstructive pulmonary disease) (HCC)    Chronic bronchitis   Depression    Dyspnea    GERD (gastroesophageal reflux disease)    Hypertension    Migraine    Neuropathy    Osteoarthritis of left knee, 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    Single subsegmental pulmonary embolism without acute cor pulmonale ([place]) 06/20/2021   Sleep apnea 04/2020   GETTING A cpap   Suicidal ideation 12/24/2018    Past Surgical History:  Procedure Laterality Date   BACK SURGERY     DILATATION AND CURETTAGE/HYSTEROSCOPY WITH MINERVA N/A 06/09/2020   Procedure: DILATATION AND CURETTAGE/HYSTEROSCOPY WITH MINERVA;  Surgeon: Florian Buff, MD;  Location: [facility];  Service: Gynecology;  Laterality: N/A;   ESOPHAGOGASTRODUODENOSCOPY (EGD) WITH PROPOFOL N/A 12/25/2018   Procedure: ESOPHAGOGASTRODUODENOSCOPY (EGD) WITH PROPOFOL;  Surgeon: Rogene Houston, MD;  Location: [facility];  Service: Endoscopy;  Laterality: N/A;   FLEXIBLE SIGMOIDOSCOPY  10/14/2021   Procedure: FLEXIBLE SIGMOIDOSCOPY;  Surgeon: Harvel Quale, MD;  Location: [facility];  Service: Gastroenterology;;   PATELLA-FEMORAL ARTHROPLASTY Left 10/08/2018   Procedure: PATELLA-FEMORAL ARTHROPLASTY;  Surgeon: Marchia Bond, MD;  Location: [facility];  Service: Orthopedics;  Laterality: Left;   TUBAL LIGATION     Family History:  Family History  Problem Relation Age of Onset   Gout Paternal Grandfather    Cirrhosis Paternal Grandfather    Hypertension Paternal Grandmother    Aneurysm Paternal Grandmother    Cirrhosis Maternal Grandmother    Cirrhosis Maternal Grandfather    Cancer Father    Cirrhosis Father    Cirrhosis Mother    Breast cancer Sister    Hypertension Sister    Bronchitis Daughter    Bronchitis Daughter    Asthma Son    Bronchitis Son    Migraines Neg Hx    Family Psychiatric  History: see H&P  Social History:  Social History   Substance and Sexual Activity  Alcohol Use Yes   Alcohol/week: 4.0 standard drinks of alcohol   Types: 4 Cans of beer per week   Comment: 4 beers Saturday     Social History   Substance and Sexual Activity  Drug Use Yes   Types: Cocaine   Comment: crack  last used June 2023    Social History   Socioeconomic  History   Marital status: Single    Spouse name: Not on file   Number of children: 3   Years of education: 10   Highest education level: 10th grade  Occupational History   Not on file  Tobacco Use   Smoking status: Every Day    Packs/day: 1.00    Years: 10.00    Total pack years: 10.00    Types: Cigarettes    Passive exposure: Never   Smokeless tobacco: Never   Tobacco comments:    Smokes often, especially when using crack cocaine  Vaping Use   Vaping Use: Never used  Substance and Sexual Activity   Alcohol use: Yes    Alcohol/week: 4.0 standard drinks of alcohol    Types: 4 Cans of beer per week    Comment: 4 beers Saturday   Drug use: Yes    Types: Cocaine    Comment: crack  last used June 2023   Sexual activity: Yes    Birth control/protection: Surgical    Comment: tubal, ablation  Other Topics Concern   Not on file  Social History Narrative   R handed    Lives with boyfriend  1 Cup of caffeine daily    Social Determinants of Health   Financial Resource Strain: Not on file  Food Insecurity: Food Insecurity Present (01/08/2022)   Hunger Vital Sign    Worried About Running Out of Food in the Last Year: Sometimes true    Ran Out of Food in the Last Year: Sometimes true  Transportation Needs: No Transportation Needs (01/08/2022)   PRAPARE - Hydrologist (Medical): No    Lack of Transportation (Non-Medical): No  Physical Activity: Not on file  Stress: Not on file  Social Connections: Not on file    Current Medications: Current Facility-Administered Medications  Medication Dose Route Frequency Provider Last Rate Last Admin   albuterol (VENTOLIN HFA) 108 (90 Base) MCG/ACT inhaler 1 puff  1 puff Inhalation Q6H PRN France Ravens, MD   1 puff at 01/12/22 0754   alum & mag hydroxide-simeth (MAALOX/MYLANTA) 200-200-20 MG/5ML suspension 30 mL  30 mL Oral Q4H PRN France Ravens, MD   30 mL at 01/11/22 1504   amLODipine (NORVASC) tablet 5 mg  5 mg  Oral Daily France Ravens, MD   5 mg at 01/12/22 0754   anagrelide (AGRYLIN) capsule 1 mg  1 mg Oral BID France Ravens, MD   1 mg at 01/12/22 0753   dicyclomine (BENTYL) tablet 20 mg  20 mg Oral Q6H PRN Harlow Asa, MD   20 mg at 01/12/22 0752   DULoxetine (CYMBALTA) DR capsule 60 mg  60 mg Oral Daily France Ravens, MD   60 mg at 01/12/22 0752   hydrOXYzine (ATARAX) tablet 50 mg  50 mg Oral Q6H PRN France Ravens, MD   50 mg at 01/11/22 2143   lidocaine (LIDODERM) 5 % 2 patch  2 patch Transdermal Daily France Ravens, MD   2 patch at 01/12/22 0753   loperamide (IMODIUM) capsule 2-4 mg  2-4 mg Oral PRN Harlow Asa, MD       lurasidone (LATUDA) tablet 40 mg  40 mg Oral Q breakfast France Ravens, MD   40 mg at 01/12/22 0752   magnesium hydroxide (MILK OF MAGNESIA) suspension 30 mL  30 mL Oral Daily PRN France Ravens, MD   30 mL at 01/11/22 0942   melatonin tablet 5 mg  5 mg Oral QHS PRN France Ravens, MD   5 mg at 01/11/22 2143   methocarbamol (ROBAXIN) tablet 500 mg  500 mg Oral Q8H PRN Harlow Asa, MD   500 mg at 01/12/22 7017   mometasone-formoterol (DULERA) 100-5 MCG/ACT inhaler 2 puff  2 puff Inhalation BID France Ravens, MD   2 puff at 01/11/22 2146   multivitamin with minerals tablet 1 tablet  1 tablet Oral Daily Harlow Asa, MD   1 tablet at 01/12/22 0752   nicotine (NICODERM CQ - dosed in mg/24 hours) patch 21 mg  21 mg Transdermal Daily Attiah, Nadir, MD   21 mg at 01/12/22 0753   ondansetron (ZOFRAN-ODT) disintegrating tablet 4 mg  4 mg Oral Q6H PRN Harlow Asa, MD   4 mg at 01/12/22 7939   senna-docusate (Senokot-S) tablet 1 tablet  1 tablet Oral BID Harlow Asa, MD   1 tablet at 01/12/22 0300   thiamine (Vitamin B-1) tablet 100 mg  100 mg Oral Daily Harlow Asa, MD   100 mg at 01/12/22 9233    Lab Results:  No results found for this or any previous visit (from the past 24 hour(s)).  Blood Alcohol level:  Lab Results  Component Value Date   ETH <10 01/06/2022   ETH <10  16/01/9603    Metabolic Disorder Labs: Lab Results  Component Value Date   HGBA1C 5.5 01/10/2022   MPG 111.15 01/10/2022   MPG 111.15 10/19/2021   No results found for: "PROLACTIN" Lab Results  Component Value Date   CHOL 168 01/10/2022   TRIG 56 01/10/2022   HDL 57 01/10/2022   CHOLHDL 2.9 01/10/2022   VLDL 11 01/10/2022   LDLCALC 100 (H) 01/10/2022   LDLCALC 84 10/19/2021    Physical Findings: AIMS: 0   Musculoskeletal: Strength & Muscle Tone: within normal limits Gait & Station: normal  Psychiatric Specialty Exam:  Presentation  General Appearance: Appropriate for Environment; Fairly Groomed   Eye Contact:Fair   Speech:Clear and Coherent   Speech Volume:Normal  Mood and Affect  Mood:described as improving - less anxious and less dysphoric today   Affect:brighter appearing, calm    Thought Process  Thought Processes:linear, goal directed   Descriptions of Associations:Intact   Orientation:Full (Time, Place and Person)   Thought Content:Denies SI, HI, AVH, paranoia, ideas of reference or first rank symptoms - is not grossly responding to internal/external stimuli on exam  Hallucinations:Denied  Ideas of Reference:None   Suicidal Thoughts:Denied Homicidal Thoughts:Denied  Sensorium  Memory:Immediate Fair; Recent Fair   Judgment:Fair   Insight:Fair    Executive Functions  Concentration:Fair   Attention Span:Fair   [place]    Psychomotor Activity  Psychomotor Activity:No observable tremors on exam until questioned about withdrawal at which time she has hand shake coarsely - no akathisias or restlessness  Assets  Assets:Resilience; Communication Skills   Physical Exam: Blood pressure 121/81, pulse 94, temperature 98.6 F (37 C), temperature source Oral, resp. rate 20, height '4\' 11"'$  (1.499 m), weight 85.3 kg, last menstrual period 06/07/2020, SpO2 98 %. Body mass index  is 37.97 kg/m.  Physical Exam Vitals and nursing note reviewed.  HENT:     Head: Normocephalic.  Pulmonary:     Effort: Pulmonary effort is normal.  Neurological:     General: No focal deficit present.     Mental Status: She is alert and oriented to person, place, and time.     Motor: No tremor.   Review of Systems  Constitutional:  Positive for diaphoresis. Negative for chills.  Respiratory:  Negative for shortness of breath.   Cardiovascular:  Positive for chest pain (feels like history of GERD).  Gastrointestinal:  Positive for constipation. Negative for diarrhea, nausea and vomiting.  Musculoskeletal:  Negative for myalgias.  Neurological:  Negative for dizziness and headaches.    ASSESSMENT AND PLAN Kathryn [name] is a 51 y.o. female with a conflicting past psychiatric history of schizoaffective d/o vs substance induced psychosis, vs bipolar d/o, vs MDD in the past. She has given inconsistent reports of her past psychiatric history to different teams during this admission. There is strong concern that her previous report of manic/psychotic symptoms are related to her substance use history. Presently she has no psychosis or evidence of mania. She feels mood improved on current doses of Cymbalta and Latuda and would like to remain on these medications.  PLAN Safety and Monitoring: Voluntary admission to inpatient psychiatric unit for safety, stabilization and treatment Daily contact with patient to assess and evaluate symptoms and progress in treatment Patient's case to be discussed in multi-disciplinary team meeting Observation Level : q15 minute checks Vital  signs: q12 hours Precautions: suicide, elopement, and assault   Psychiatric Diagnoses and Treatment  MDD-Severe, recurrent episode without psychosis (r/o schizoaffective d/o by hx, r/o substance induced mood d/o, r/o bipolar depression)             -- Continue Latuda 40 mg for mood stabilization  Metabolic profile and  EKG monitoring obtained while on an atypical antipsychotic (BMI: 37.97 Lipid Panel: LDL 100, otw WNL HbgA1c: 5.5; QTc: 440)              -- Continue Duloxetine 60 mg for depression symptoms  -- Continue melatonin '5mg'$  qhs PRN insomnia -- The risks/benefits/side-effects/alternatives to these medications were discussed in detail with the patient and time was given for questions. The patient consents to medication trials.              -- Encouraged patient to participate in unit milieu and in scheduled group therapies   Stimulant use disorder-cocaine R/o alcohol use disorder Nicotine use disorder            -- Monitoring for signs of withdrawal. There was initially some concern for medication seeking behavior on admission when she reported that she did not like the side effects of Tylenol and Ibuprofen and said that Oxycodone typically works best for her as a pain medication. Given this concern as well as new subjective reports of withdrawal 6 days after last alcohol use, we will hold benzodiazepines until patient has objective findings of withdrawal such as hypertension, tachycardia, fevers, or pupillary changes. She understands that she will not receive additional controlled substances for withdrawal symptoms.   -- MVI and thiamine oral replacement  -- PRNs available for symptomatic withdrawal  -- Counseled on need to abstain from substance use and patient interested in [facility] at discharge - SW also to assist with referrals to [facility].  Medical Problems Being Addressed Hx of DVT/PE in May 2022 Completed treatment regimen. No new acute symptoms. Last admission in July 2023, IM was consulted and recommended to NOT resume eliquis as no indication for it. She was resumed on eliquis in ED for no clear reason.  -Monitor for new unilateral sharp pain with associated swelling. These are not present today.   Chronic pain secondary to spondylolisthesis s/p lumbar fusion  May 2022 Associated neuropathy -Previously was on pregabalin but switched to gabapentin due to abuse potential. Patient refused gabapentin.  - Lidoderm patch daily for pain  Hypertension BP 121/81 -Amlodipine 5 mg daily -Clonidine was discontinued as patient has not been taking and BP wnl   Essential thrombocytosis Follows with Dr. Lindi Adie (last seen 09/2021, recommended continuing anagrelide with followup in ~3 months) - Continue anagrelide 1 mg bid - Platelets 637 today - OP follow up with Dr. Lindi Adie   BLE edema, stable -Discontinue lasix and monitoring for sx -Outpatient PCP follow up  Constipation -- Encouraged PRN MOM and continue Senokot S tab bid  GERD -- start Protonix '40mg'$  daily and TUMS bid PRN  Nicotine use d/o -- counseled on smoking cessation -- Nicotine patch '21mg'$ /24 hours daily   COPD -- continue Albuterol MDI PRN -- Continue Dulera 2 puffs daily  4. Discharge Planning: Social work and case management to assist with discharge planning and identification of hospital follow-up needs prior to discharge Estimated LOS: 5-7 days Discharge Concerns: Need to establish a safety plan; Medication compliance and effectiveness Discharge Goals: Return home with outpatient referrals for mental health follow-up including medication management/psychotherapy. Patient would like  to go to [place] and is calling to find placement.  Elane Fritz, MS3 01/12/2022, 9:25 AM  Attestation for Student Documentation:   I certify that I saw and interviewed the patient together with the medical student and was present for the duration of the interview.  I reviewed the medical record.  I performed or reperformed the mental status examination of the patient as indicated.  I formulated the assessment and plan of treatment as documented above with edits. Viann Fish, MD, [facility] Ponder

## 2022-01-12 NOTE — Group Note (Signed)
Date:  01/12/2022 Time:  4:46 PM  Group Topic/Focus:  Wellness Toolbox:   The focus of this group is to discuss various aspects of wellness, balancing those aspects and exploring ways to increase the ability to experience wellness.  Patients will create a wellness toolbox for use upon discharge.    Participation Level:  Did Not Attend  Participation Quality:      Affect:      Cognitive:      Insight: None  Engagement in Group:    Modes of Intervention:      Additional Comments:     [name] 01/12/2022, 4:46 PM

## 2022-01-12 NOTE — Progress Notes (Signed)
Pt has had no signs or symptoms of substance withdrawal today.  Pt requesting to take "all the medication I can take because I hurt."  Pt says the only pain medication that helps pt Is oxycodone.  Pt is observed out of her room, talking with others and going down to cafeteria for dinner this evening.  RN will continue to monitor pt's status and provide support as needed.

## 2022-01-12 NOTE — Progress Notes (Signed)
Psychoeducational Group Note  Date:  01/12/2022 Time:  2132  Group Topic/Focus:  Wrap-Up Group:   The focus of this group is to help patients review their daily goal of treatment and discuss progress on daily workbooks.  Participation Level: Did Not Attend  Participation Quality:  Not Applicable  Affect:  Not Applicable  Cognitive:  Not Applicable  Insight:  Not Applicable  Engagement in Group: Not Applicable  Additional Comments:  The patient did not attend group this evening.   Archie Balboa S 01/12/2022, 9:32 PM

## 2022-01-12 NOTE — Plan of Care (Signed)
  Problem: Education: Goal: Ability to state activities that reduce stress will improve Outcome: Progressing   Problem: Education: Goal: Utilization of techniques to improve thought processes will improve Outcome: Progressing Goal: Knowledge of the prescribed therapeutic regimen will improve Outcome: Progressing   Problem: Education: Goal: Knowledge of [place] [facility] Education information/materials will improve Outcome: Progressing Goal: Emotional status will improve Outcome: Progressing Goal: Mental status will improve Outcome: Progressing Goal: Verbalization of understanding the information provided will improve Outcome: Progressing   Problem: Education: Goal: Knowledge of disease or condition will improve Outcome: Progressing Goal: Understanding of discharge needs will improve Outcome: Progressing   Problem: Education: Goal: Ability to make informed decisions regarding treatment will improve Outcome: Progressing

## 2022-01-12 NOTE — Group Note (Signed)
Date:  01/12/2022 Time:  11:25 AM  Group Topic/Focus:  Orientation:   The focus of this group is to educate the patient on the purpose and policies of crisis stabilization and provide a format to answer questions about their admission.  The group details unit policies and expectations of patients while admitted.    Participation Level:  Minimal  Participation Quality:  Appropriate  Affect:  Blunted  Cognitive:  Appropriate  Insight: Appropriate  Engagement in Group:  Lacking  Modes of Intervention:  Discussion  Additional Comments:     [name] 01/12/2022, 11:25 AM

## 2022-01-13 ENCOUNTER — Encounter (HOSPITAL_COMMUNITY): Payer: Self-pay

## 2022-01-13 DIAGNOSIS — F332 Major depressive disorder, recurrent severe without psychotic features: Secondary | ICD-10-CM | POA: Diagnosis not present

## 2022-01-13 NOTE — Progress Notes (Signed)
Pt rates depression 7/10 and anxiety 8/10. Pt reports a good appetite. Pt reports pain 10/10 lower back. Patches applied. Pt denies SI/HI/AVH and verbally contracts for safety. Provided support and encouragement. Pt safe on the unit. Q 15 minute safety checks continued.

## 2022-01-13 NOTE — Group Note (Signed)
Recreation Therapy Group Note   Group Topic:Healthy Decision Making  Group Date: 01/13/2022 Start Time: 0930 End Time: 1000 Facilitators: Kiera Hussey-McCall, LRT,CTRS Location: [address] Dayroom   Goal Area(s) Addresses:  Patient will effectively work with peer towards shared goal.  Patient will identify factors that guided their decision making.  Patient will pro-socially communicate ideas during group session.   Group Description:  Patients were given a scenario that they were going to be stranded on a deserted [place] for several months before being rescued. Writer tasked them with making a list of 15 things they would choose to bring with them for "survival". The list of items was prioritized most important to least. Each patient would come up with their own list, then work together to create a new list of 15 items while in a group of 3-5 peers. LRT discussed each person's list and how it differed from others. The debrief included discussion of priorities, good decisions versus bad decisions, and how it is important to think before acting so we can make the best decision possible. LRT tied the concept of effective communication among group members to patient's support systems outside of the hospital and its benefit post discharge.   Affect/Mood: Appropriate   Participation Level: Engaged   Participation Quality: Independent   Behavior: Appropriate   Speech/Thought Process: Focused   Insight: Good   Judgement: Good   Modes of Intervention: Activity   Patient Response to Interventions:  Engaged   Education Outcome:  Acknowledges education and In group clarification offered    Clinical Observations/Individualized Feedback: Pt was bright and engaged.  Pt worked well with peers in coming up with a completed list for their survival on an deserted [place].     Plan: Continue to engage patient in RT group sessions 2-3x/week.   Verlee Pope-McCall, LRT,CTRS 01/13/2022  12:56 PM

## 2022-01-13 NOTE — BH IP Treatment Plan (Signed)
Interdisciplinary Treatment and Diagnostic Plan Update  01/13/2022 Time of Session: 0830 Kathryn [name] MRN: [id number]  Principal Diagnosis: MDD (major depressive disorder), recurrent severe, without psychosis ([place])  Secondary Diagnoses: Principal Problem:   MDD (major depressive disorder), recurrent severe, without psychosis ([place]) Active Problems:   Hypertension   Chronic pain   Essential thrombocytosis ([place])   Severe stimulant use disorder ([place])   Current Medications:  Current Facility-Administered Medications  Medication Dose Route Frequency Provider Last Rate Last Admin   albuterol (VENTOLIN HFA) 108 (90 Base) MCG/ACT inhaler 1 puff  1 puff Inhalation Q6H PRN France Ravens, MD   1 puff at 01/12/22 0754   alum & mag hydroxide-simeth (MAALOX/MYLANTA) 200-200-20 MG/5ML suspension 30 mL  30 mL Oral Q4H PRN France Ravens, MD   30 mL at 01/11/22 1504   amLODipine (NORVASC) tablet 5 mg  5 mg Oral Daily France Ravens, MD   5 mg at 01/13/22 0849   anagrelide (AGRYLIN) capsule 1 mg  1 mg Oral BID France Ravens, MD   1 mg at 01/13/22 0948   calcium carbonate (TUMS - dosed in mg elemental calcium) chewable tablet 200 mg of elemental calcium  1 tablet Oral BID PRN Viann Fish E, MD   200 mg of elemental calcium at 01/12/22 2127   dicyclomine (BENTYL) tablet 20 mg  20 mg Oral Q6H PRN Harlow Asa, MD   20 mg at 01/12/22 1426   DULoxetine (CYMBALTA) DR capsule 60 mg  60 mg Oral Daily France Ravens, MD   60 mg at 01/13/22 0849   hydrOXYzine (ATARAX) tablet 50 mg  50 mg Oral Q6H PRN France Ravens, MD   50 mg at 01/12/22 1709   lidocaine (LIDODERM) 5 % 2 patch  2 patch Transdermal Daily France Ravens, MD   2 patch at 01/13/22 0853   loperamide (IMODIUM) capsule 2-4 mg  2-4 mg Oral PRN Harlow Asa, MD       lurasidone (LATUDA) tablet 40 mg  40 mg Oral Q breakfast France Ravens, MD   40 mg at 01/13/22 0849   magnesium hydroxide (MILK OF MAGNESIA) suspension 30 mL  30 mL Oral Daily PRN France Ravens, MD   30 mL at  01/11/22 0942   melatonin tablet 5 mg  5 mg Oral QHS PRN France Ravens, MD   5 mg at 01/12/22 2125   methocarbamol (ROBAXIN) tablet 500 mg  500 mg Oral Q8H PRN Harlow Asa, MD   500 mg at 01/12/22 1426   mometasone-formoterol (DULERA) 100-5 MCG/ACT inhaler 2 puff  2 puff Inhalation BID France Ravens, MD   2 puff at 01/13/22 0853   multivitamin with minerals tablet 1 tablet  1 tablet Oral Daily Harlow Asa, MD   1 tablet at 01/13/22 0849   nicotine (NICODERM CQ - dosed in mg/24 hours) patch 21 mg  21 mg Transdermal Daily Attiah, Nadir, MD   21 mg at 01/13/22 0902   ondansetron (ZOFRAN-ODT) disintegrating tablet 4 mg  4 mg Oral Q6H PRN Harlow Asa, MD   4 mg at 01/12/22 1710   pantoprazole (PROTONIX) EC tablet 40 mg  40 mg Oral Daily Harlow Asa, MD   40 mg at 01/13/22 0849   senna-docusate (Senokot-S) tablet 1 tablet  1 tablet Oral BID Harlow Asa, MD   1 tablet at 01/13/22 0849   thiamine (Vitamin B-1) tablet 100 mg  100 mg Oral Daily Harlow Asa, MD   100 mg  at 01/13/22 0849   PTA Medications: Medications Prior to Admission  Medication Sig Dispense Refill Last Dose   albuterol (VENTOLIN HFA) 108 (90 Base) MCG/ACT inhaler Inhale into the lungs.      amLODipine (NORVASC) 5 MG tablet Take 1 tablet (5 mg total) by mouth daily. 30 tablet 0    anagrelide (AGRYLIN) 1 MG capsule Take 1 capsule (1 mg total) by mouth 2 (two) times daily. 60 capsule 0    benzonatate (TESSALON) 100 MG capsule Take 1 capsule (100 mg total) by mouth 2 (two) times daily as needed for cough. 20 capsule 0    benztropine (COGENTIN) 0.5 MG tablet Take 1 tablet (0.5 mg total) by mouth 2 (two) times daily as needed for tremors (EPS/dystonia). 30 tablet 0    citalopram (CELEXA) 20 MG tablet Take 1 tablet (20 mg total) by mouth daily. 30 tablet 0    cloNIDine (CATAPRES) 0.1 MG tablet Take 0.1 mg by mouth daily.      DULoxetine (CYMBALTA) 30 MG capsule Take 30 mg by mouth 2 (two) times daily.      fluticasone  furoate-vilanterol (BREO ELLIPTA) 200-25 MCG/ACT AEPB Inhale 1 puff into the lungs daily. 60 each 2    furosemide (LASIX) 20 MG tablet Take 1 tablet (20 mg total) by mouth daily. 30 tablet 10    hydrOXYzine (VISTARIL) 50 MG capsule Take 100 mg by mouth 2 (two) times daily.      lubiprostone (AMITIZA) 24 MCG capsule Take 1 capsule (24 mcg total) by mouth 2 (two) times daily with a meal. 60 capsule 0    lurasidone (LATUDA) 40 MG TABS tablet Take 40 mg by mouth daily with breakfast.      melatonin 3 MG TABS tablet Take 1 tablet (3 mg total) by mouth at bedtime. 30 tablet 0    methocarbamol (ROBAXIN) 500 MG tablet Take 500 mg by mouth 2 (two) times daily.      mirtazapine (REMERON) 15 MG tablet Take 15 mg by mouth at bedtime.      mometasone-formoterol (DULERA) 200-5 MCG/ACT AERO Inhale 2 puffs into the lungs 2 (two) times daily. 1 each 0    omeprazole (PRILOSEC) 20 MG capsule Take 20 mg by mouth daily.      orphenadrine (NORFLEX) 100 MG tablet Take 100 mg by mouth 2 (two) times daily as needed.      oxyCODONE (ROXICODONE) 5 MG immediate release tablet Take 1 tablet (5 mg total) by mouth every 6 (six) hours as needed for severe pain. 15 tablet 0    pantoprazole (PROTONIX) 40 MG tablet Take 1 tablet (40 mg total) by mouth daily. 30 tablet 0    potassium chloride SA (KLOR-CON M) 20 MEQ tablet Take 1 tablet (20 mEq total) by mouth daily. TAKE (1) TABLET BY MOUTH ONCE A DAY. Strength: 20 mEq 30 tablet 0    pregabalin (LYRICA) 150 MG capsule Take 1 capsule (150 mg total) by mouth 2 (two) times daily. 60 capsule 5    risperiDONE (RISPERDAL) 0.5 MG tablet Take 1 tablet (0.5 mg total) by mouth daily. (Patient not taking: Reported on 12/20/2021) 30 tablet 0    risperiDONE (RISPERDAL) 1 MG tablet Take 1 tablet (1 mg total) by mouth at bedtime. (Patient not taking: Reported on 12/20/2021) 30 tablet 0    tiZANidine (ZANAFLEX) 4 MG tablet Take 1 tablet (4 mg total) by mouth every 8 (eight) hours as needed for muscle  spasms. 30 tablet 0    traZODone (  DESYREL) 100 MG tablet Take 100 mg by mouth at bedtime.      vitamin B-12 1000 MCG tablet Take 1 tablet (1,000 mcg total) by mouth daily. 30 tablet 0    Vitamin D, Ergocalciferol, (DRISDOL) 1.25 MG (50000 UNIT) CAPS capsule Take 1 capsule (50,000 Units total) by mouth every 7 (seven) days. 5 capsule 0     Patient Stressors:   Patient Strengths: Average or above average intelligence  Capable of independent living  Communication skills  General fund of knowledge  Motivation for treatment/growth   Treatment Modalities: Medication Management, Group therapy, Case management,  1 to 1 session with clinician, Psychoeducation, Recreational therapy.   Physician Treatment Plan for Primary Diagnosis: MDD (major depressive disorder), recurrent severe, without psychosis ([place]) Long Term Goal(s): Improvement in symptoms so as ready for discharge   Short Term Goals: Ability to identify changes in lifestyle to reduce recurrence of condition will improve  Medication Management: Evaluate patient's response, side effects, and tolerance of medication regimen.  Therapeutic Interventions: 1 to 1 sessions, Unit Group sessions and Medication administration.  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  Therapeutic Interventions: 1 to 1 sessions, Unit Group sessions and Medication administration.  Evaluation of Outcomes:  Progressing   RN Treatment Plan for Primary Diagnosis: MDD (major depressive disorder), recurrent severe, without psychosis ([place]) Long Term Goal(s): Knowledge of disease and therapeutic regimen to maintain health will improve  Short Term Goals: Ability to remain free from injury will improve, Ability to verbalize frustration and anger appropriately will improve, Ability to demonstrate self-control, Ability to participate in decision making will improve, Ability to verbalize feelings will improve, Ability to disclose and discuss suicidal ideas, Ability to identify and develop effective coping behaviors will improve, and Compliance with prescribed medications will improve  Medication Management: RN will administer medications as ordered by provider, will assess and evaluate patient's response and provide education to patient for prescribed medication. RN will report any adverse and/or side effects to prescribing provider.  Therapeutic Interventions: 1 on 1 counseling sessions, Psychoeducation, Medication administration, Evaluate responses to treatment, Monitor vital signs and CBGs as ordered, Perform/monitor CIWA, COWS, AIMS and Fall Risk screenings as ordered, Perform wound care treatments as ordered.  Evaluation of Outcomes: Progressing   LCSW Treatment Plan for Primary Diagnosis: MDD (major depressive disorder), recurrent severe, without psychosis ([place]) Long Term Goal(s): Safe transition to appropriate next level of care at discharge, Engage patient in therapeutic group addressing interpersonal concerns.  Short Term Goals: Engage patient in aftercare planning with referrals and resources, Increase social support, Increase ability to appropriately verbalize feelings, Increase emotional regulation, Facilitate acceptance of mental health diagnosis and concerns, Facilitate patient progression through stages of change regarding substance use diagnoses and concerns, Identify triggers associated with  mental health/substance abuse issues, and Increase skills for wellness and recovery  Therapeutic Interventions: Assess for all discharge needs, 1 to 1 time with Social worker, Explore available resources and support systems, Assess for adequacy in community support network, Educate family and  significant other(s) on suicide prevention, Complete Psychosocial Assessment, Interpersonal group therapy.  Evaluation of Outcomes: Progressing   Progress in Treatment: Attending groups: Yes. Participating in groups: Yes. Taking medication as prescribed: Yes. Toleration medication: Yes. Family/Significant other contact made: Yes, individual(s) contacted:   Philomena Doheny [phone] Patient understands diagnosis: Yes. Discussing patient identified problems/goals with staff: Yes. Medical problems stabilized or resolved: Yes. Denies suicidal/homicidal ideation: No. Issues/concerns per patient self-inventory: Yes. Other: none  New problem(s) identified: No, Describe:  none  New Short Term/Long Term Goal(s): Patient to work towards detox, elimination of symptoms of psychosis, medication management for mood stabilization; elimination of SI thoughts; development of comprehensive mental wellness/sobriety plan.  Patient Goals:  No additional goals identified at this time. Patient to continue to work towards original goals identified in initial treatment team meeting. CSW will remain available to patient should they voice additional treatment goals.   Discharge Plan or Barriers: No psychosocial barriers identified at this time, patient to return to place of residence when appropriate for discharge.   Reason for Continuation of Hospitalization: Anxiety Depression  Estimated Length of Stay: 1-7 days   Last 3 [place] Suicide Severity Risk Score: [place] Admission (Current) from 01/08/2022 in [facility] ED from 01/06/2022 in [facility] ED from  11/09/2021 in [facility] High Risk High Risk Error: Q7 should not be populated when Q6 is No       Last PHQ 2/9 Scores:    12/20/2021   11:26 AM 11/03/2021    9:10 AM 09/06/2021    2:18 PM  Depression screen PHQ 2/9  Decreased Interest 3 0 0  Down, Depressed, Hopeless 3 0 0  PHQ - 2 Score 6 0 0  Altered sleeping 3  0  Tired, decreased energy 3  0  Change in appetite 3  0  Feeling bad or failure about yourself  3  0  Trouble concentrating 3  0  Moving slowly or fidgety/restless 0  0  Suicidal thoughts 3  0  PHQ-9 Score 24  0  Difficult doing work/chores Very difficult  Not difficult at all    Scribe for Treatment Team: Durenda Hurt, Latanya Presser 01/13/2022 10:19 AM

## 2022-01-13 NOTE — Progress Notes (Addendum)
[facility] MD Progress Note  01/13/2022 9:29 AM Kathryn [name]  MRN:  [id number]  Principal Problem: MDD (major depressive disorder), recurrent severe, without psychosis ([place]) Diagnosis: Principal Problem:   MDD (major depressive disorder), recurrent severe, without psychosis ([place]) Active Problems:   Hypertension   Chronic pain   Essential thrombocytosis ([place])   Severe stimulant use disorder ([place])  Reason for Admission: Kathryn [name] is a 51 y.o. female with a past psychiatric hx of schizoaffective d/o bipolar type vs substance induced psychosis, MDD, GAD, and polysubstance abuse, who presented to [facility] reporting increased depression and suicidal ideations with plan to cut her wrists. Now admitted to [facility] voluntarily for further treatment.This is hospitalization day 5.  Chart Review from last 24 hours:  Past 24 hours of patient's chart was reviewed. Patient is compliant with scheduled meds. Required PRNs yesterday: Albuterol X1, TUMS x2, Bentyl X2, Ativan X1 yesterday morning for CIWA of 11 at 0300. Vistaril X2 yesterday for anxiety, Melatonin X1 for sleep, and Robaxin X2 yesterday for spasms, and Zofran X2 yesterday for nausea.  Per RN notes, no documented behavioral issues and is attending select groups. Sleep time not documented  Yesterday's Psychiatry Team's Recommendation: -- Continue Latuda 40 mg for mood stabilization and given past h/o psychosis -- Continue Cymbalta 60 mg for depression  Subjective:  On evaluation today, the patient states that her depression is improving. She states that she slept well last night. She denies SI, HI, visual or auditory hallucinations. She denies ideas of reference, first rank symptoms, or paranoia. She does not complain of tremors, nausea, or cramping today and states her withdrawal symptoms have resolved. She is interested in getting into an [facility] at time of discharge, and she states that she had a call with them yesterday at 5 pm. She states  that she is still waiting to her back on acceptance to this facility. We discussed that SW is also assisting with SLA and other residential rehab referrals.She voices no other physical complaints other than intermittent dizziness with standing and was advised to fluid hydrate. She denies medication side-effects.She reports good appetite and sleep.  Total Time Spent in Direct Patient Care:  I personally spent 30 minutes on the unit in direct patient care. The direct patient care time included face-to-face time with the patient, reviewing the patient's chart, communicating with other professionals, and coordinating care. Greater than 50% of this time was spent in counseling or coordinating care with the patient regarding goals of hospitalization, psycho-education, and discharge planning needs.  Past Psychiatric History: see H&P  Past Medical History:  Past Medical History:  Diagnosis Date   Anxiety    Arthritis    Asthma    Bipolar disorder ([place])    Chronic back pain    COPD (chronic obstructive pulmonary disease) (HCC)    Chronic bronchitis   Depression    Dyspnea    GERD (gastroesophageal reflux disease)    Hypertension    Migraine    Neuropathy    Osteoarthritis of left knee, 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   Single subsegmental pulmonary embolism without acute cor pulmonale ([place]) 06/20/2021   Sleep apnea 04/2020   GETTING A cpap   Suicidal ideation 12/24/2018    Past Surgical History:  Procedure Laterality Date   BACK SURGERY     DILATATION AND CURETTAGE/HYSTEROSCOPY WITH MINERVA N/A 06/09/2020   Procedure: DILATATION AND CURETTAGE/HYSTEROSCOPY WITH MINERVA;  Surgeon: Florian Buff, MD;  Location: [facility];  Service: Gynecology;  Laterality: N/A;  ESOPHAGOGASTRODUODENOSCOPY (EGD) WITH PROPOFOL N/A 12/25/2018   Procedure: ESOPHAGOGASTRODUODENOSCOPY (EGD) WITH PROPOFOL;  Surgeon: Rogene Houston, MD;  Location: [facility];  Service: Endoscopy;  Laterality: N/A;   FLEXIBLE  SIGMOIDOSCOPY  10/14/2021   Procedure: FLEXIBLE SIGMOIDOSCOPY;  Surgeon: Harvel Quale, MD;  Location: [facility];  Service: Gastroenterology;;   PATELLA-FEMORAL ARTHROPLASTY Left 10/08/2018   Procedure: PATELLA-FEMORAL ARTHROPLASTY;  Surgeon: Marchia Bond, MD;  Location: [facility]S;  Service: Orthopedics;  Laterality: Left;   TUBAL LIGATION     Family History:  Family History  Problem Relation Age of Onset   Gout Paternal Grandfather    Cirrhosis Paternal Grandfather    Hypertension Paternal Grandmother    Aneurysm Paternal Grandmother    Cirrhosis Maternal Grandmother    Cirrhosis Maternal Grandfather    Cancer Father    Cirrhosis Father    Cirrhosis Mother    Breast cancer Sister    Hypertension Sister    Bronchitis Daughter    Bronchitis Daughter    Asthma Son    Bronchitis Son    Migraines Neg Hx    Family Psychiatric  History: see H&P  Social History:  Social History   Substance and Sexual Activity  Alcohol Use Yes   Alcohol/week: 4.0 standard drinks of alcohol   Types: 4 Cans of beer per week   Comment: 4 beers Saturday     Social History   Substance and Sexual Activity  Drug Use Yes   Types: Cocaine   Comment: crack  last used June 2023    Social History   Socioeconomic History   Marital status: Single    Spouse name: Not on file   Number of children: 3   Years of education: 10   Highest education level: 10th grade  Occupational History   Not on file  Tobacco Use   Smoking status: Every Day    Packs/day: 1.00    Years: 10.00    Total pack years: 10.00    Types: Cigarettes    Passive exposure: Never   Smokeless tobacco: Never   Tobacco comments:    Smokes often, especially when using crack cocaine  Vaping Use   Vaping Use: Never used  Substance and Sexual Activity   Alcohol use: Yes    Alcohol/week: 4.0 standard drinks of alcohol    Types: 4 Cans of beer per week    Comment: 4 beers Saturday   Drug use: Yes    Types: Cocaine     Comment: crack  last used June 2023   Sexual activity: Yes    Birth control/protection: Surgical    Comment: tubal, ablation  Other Topics Concern   Not on file  Social History Narrative   R handed    Lives with boyfriend   1 Cup of caffeine daily    Social Determinants of Health   Financial Resource Strain: Not on file  Food Insecurity: Food Insecurity Present (01/08/2022)   Hunger Vital Sign    Worried About Running Out of Food in the Last Year: Sometimes true    Ran Out of Food in the Last Year: Sometimes true  Transportation Needs: No Transportation Needs (01/08/2022)   PRAPARE - Hydrologist (Medical): No    Lack of Transportation (Non-Medical): No  Physical Activity: Not on file  Stress: Not on file  Social Connections: Not on file    Current Medications: Current Facility-Administered Medications  Medication Dose Route Frequency Provider Last Rate Last  Admin   albuterol (VENTOLIN HFA) 108 (90 Base) MCG/ACT inhaler 1 puff  1 puff Inhalation Q6H PRN France Ravens, MD   1 puff at 01/12/22 0754   alum & mag hydroxide-simeth (MAALOX/MYLANTA) 200-200-20 MG/5ML suspension 30 mL  30 mL Oral Q4H PRN France Ravens, MD   30 mL at 01/11/22 1504   amLODipine (NORVASC) tablet 5 mg  5 mg Oral Daily France Ravens, MD   5 mg at 01/13/22 0962   anagrelide (AGRYLIN) capsule 1 mg  1 mg Oral BID France Ravens, MD   1 mg at 01/12/22 2122   calcium carbonate (TUMS - dosed in mg elemental calcium) chewable tablet 200 mg of elemental calcium  1 tablet Oral BID PRN Viann Fish E, MD   200 mg of elemental calcium at 01/12/22 2127   dicyclomine (BENTYL) tablet 20 mg  20 mg Oral Q6H PRN Harlow Asa, MD   20 mg at 01/12/22 1426   DULoxetine (CYMBALTA) DR capsule 60 mg  60 mg Oral Daily France Ravens, MD   60 mg at 01/13/22 0849   hydrOXYzine (ATARAX) tablet 50 mg  50 mg Oral Q6H PRN France Ravens, MD   50 mg at 01/12/22 1709   lidocaine (LIDODERM) 5 % 2 patch  2 patch Transdermal  Daily France Ravens, MD   2 patch at 01/13/22 8366   loperamide (IMODIUM) capsule 2-4 mg  2-4 mg Oral PRN Harlow Asa, MD       lurasidone (LATUDA) tablet 40 mg  40 mg Oral Q breakfast France Ravens, MD   40 mg at 01/13/22 0849   magnesium hydroxide (MILK OF MAGNESIA) suspension 30 mL  30 mL Oral Daily PRN France Ravens, MD   30 mL at 01/11/22 0942   melatonin tablet 5 mg  5 mg Oral QHS PRN France Ravens, MD   5 mg at 01/12/22 2125   methocarbamol (ROBAXIN) tablet 500 mg  500 mg Oral Q8H PRN Harlow Asa, MD   500 mg at 01/12/22 1426   mometasone-formoterol (DULERA) 100-5 MCG/ACT inhaler 2 puff  2 puff Inhalation BID France Ravens, MD   2 puff at 01/13/22 0853   multivitamin with minerals tablet 1 tablet  1 tablet Oral Daily Harlow Asa, MD   1 tablet at 01/13/22 0849   nicotine (NICODERM CQ - dosed in mg/24 hours) patch 21 mg  21 mg Transdermal Daily Attiah, Nadir, MD   21 mg at 01/13/22 0902   ondansetron (ZOFRAN-ODT) disintegrating tablet 4 mg  4 mg Oral Q6H PRN Harlow Asa, MD   4 mg at 01/12/22 1710   pantoprazole (PROTONIX) EC tablet 40 mg  40 mg Oral Daily Viann Fish E, MD   40 mg at 01/13/22 0849   senna-docusate (Senokot-S) tablet 1 tablet  1 tablet Oral BID Harlow Asa, MD   1 tablet at 01/13/22 0849   thiamine (Vitamin B-1) tablet 100 mg  100 mg Oral Daily Harlow Asa, MD   100 mg at 01/13/22 2947    Lab Results:  No results found for this or any previous visit (from the past 24 hour(s)).   Blood Alcohol level:  Lab Results  Component Value Date   [facility] <10 01/06/2022   ETH <10 65/46/5035    Metabolic Disorder Labs: Lab Results  Component Value Date   HGBA1C 5.5 01/10/2022   MPG 111.15 01/10/2022   MPG 111.15 10/19/2021   No results found for: "PROLACTIN" Lab Results  Component  Value Date   CHOL 168 01/10/2022   TRIG 56 01/10/2022   HDL 57 01/10/2022   CHOLHDL 2.9 01/10/2022   VLDL 11 01/10/2022   LDLCALC 100 (H) 01/10/2022   LDLCALC 84 10/19/2021     Physical Findings: AIMS: 0   Musculoskeletal: Strength & Muscle Tone: within normal limits Gait & Station: normal  Psychiatric Specialty Exam:  Presentation  General Appearance: Appropriate for Environment; Fairly Groomed   Eye Contact:Fair   Speech:Clear and Coherent   Speech Volume:Normal  Mood and Affect  Mood:described as improving - less anxious and less dysphoric today   Affect:brighter appearing, calm    Thought Process  Thought Processes:linear, goal directed   Descriptions of Associations:Intact   Orientation:Full (Time, Place and Person)   Thought Content:Denies SI, HI, AVH, paranoia, ideas of reference or first rank symptoms - is not grossly responding to internal/external stimuli on exam  Hallucinations:Denied  Ideas of Reference:None   Suicidal Thoughts:Denied Homicidal Thoughts:Denied  Sensorium  Memory:Immediate Fair; Recent Fair   Judgment:Fair   Insight:Fair    Executive Functions  Concentration:Fair   Attention Span:Fair   [place]    Psychomotor Activity  Psychomotor Activity:No observable tremors on exam, no akathisias or restlessness  Assets  Assets:Resilience; Communication Skills   Physical Exam: Blood pressure 120/80, pulse 96, temperature 98.3 F (36.8 C), temperature source Oral, resp. rate 16, height '4\' 11"'$  (1.499 m), weight 85.3 kg, last menstrual period 06/07/2020, SpO2 99 %. Body mass index is 37.97 kg/m.  Physical Exam Vitals and nursing note reviewed.  HENT:     Head: Normocephalic.  Pulmonary:     Effort: Pulmonary effort is normal.  Neurological:     General: No focal deficit present.     Mental Status: She is alert and oriented to person, place, and time.     Motor: No tremor.   Review of Systems  Constitutional:  Negative for chills and diaphoresis.  Respiratory:  Negative for shortness of breath.   Cardiovascular:  Negative for  chest pain.  Gastrointestinal:  Negative for constipation, diarrhea, nausea and vomiting.  Musculoskeletal:  Negative for myalgias.  Neurological:  Positive for dizziness. Negative for headaches.    ASSESSMENT AND PLAN Kathryn [name] is a 51 y.o. female with a conflicting past psychiatric history of schizoaffective d/o vs substance induced psychosis, vs bipolar d/o, vs MDD in the past. She has given inconsistent reports of her past psychiatric history to different teams during this admission. There is strong concern that her previous report of manic/psychotic symptoms are related to her substance use history. Presently she has no psychosis or evidence of mania. She feels mood improved on current doses of Cymbalta and Latuda and would like to remain on these medications.  PLAN Safety and Monitoring: Voluntary admission to inpatient psychiatric unit for safety, stabilization and treatment Daily contact with patient to assess and evaluate symptoms and progress in treatment Patient's case to be discussed in multi-disciplinary team meeting Observation Level : q15 minute checks Vital signs: q12 hours Precautions: suicide, elopement, and assault   Psychiatric Diagnoses and Treatment  MDD-Severe, recurrent episode without psychosis (r/o schizoaffective d/o by hx, r/o substance induced mood d/o, r/o bipolar depression)             -- Continue Latuda 40 mg for mood stabilization  Metabolic profile and EKG monitoring obtained while on an atypical antipsychotic (BMI: 37.97 Lipid Panel: LDL 100, otw WNL HbgA1c: 5.5; QTc: 440)              --  Continue Cymbalta 60 mg for depression symptoms  -- Continue melatonin '5mg'$  qhs PRN insomnia -- The risks/benefits/side-effects/alternatives to these medications were discussed in detail with the patient and time was given for questions. The patient consents to medication trials.              -- Encouraged patient to participate in unit milieu and in scheduled group  therapies   Stimulant use disorder-cocaine R/o alcohol use disorder Nicotine use disorder            -- Monitoring for signs of withdrawal. Recent CIWA scores 2,2  -- MVI and thiamine oral replacement  -- PRNs available for symptomatic withdrawal  -- Counseled on need to abstain from substance use and patient interested in [facility] at discharge or residential treatment - SW also to assist with residential rehab referrals which are pending review  Medical Problems Being Addressed Hx of DVT/PE in May 2022 Completed treatment regimen. No new acute symptoms. Last admission in July 2023, IM was consulted and recommended to NOT resume eliquis as no indication for it. She was resumed on eliquis in ED for no clear reason.  -Monitor for new unilateral sharp pain with associated swelling. These are not present today.   Chronic pain secondary to spondylolisthesis s/p lumbar fusion May 2022 Associated neuropathy -Previously was on pregabalin but switched to gabapentin due to abuse potential. Patient refused gabapentin.  - Lidoderm patch daily for pain  Hypertension BP 120/80 -Amlodipine 5 mg daily -Clonidine was discontinued as patient has not been taking and BP wnl   Essential thrombocytosis Follows with Dr. Lindi Adie (last seen 09/2021, recommended continuing anagrelide with followup in ~3 months) - Continue anagrelide 1 mg bid - Platelets 637 on 9/26 - OP follow up with Dr. Lindi Adie   BLE edema, stable -Discontinue lasix and monitoring for sx -Outpatient PCP follow up  Constipation -- Encouraged PRN MOM and continue Senokot S tab bid  GERD -- start Protonix '40mg'$  daily and TUMS bid PRN, seems to be improving  Nicotine use d/o -- counseled on smoking cessation -- Nicotine patch '21mg'$ /24 hours daily   COPD -- continue Albuterol MDI PRN -- Continue Dulera 2 puffs daily  4. Discharge Planning: Social work and case management to assist with discharge planning and identification of  hospital follow-up needs prior to discharge Estimated LOS: 5-7 days Discharge Concerns: Need to establish a safety plan; Medication compliance and effectiveness Discharge Goals: Return home with outpatient referrals for mental health follow-up including medication management/psychotherapy. Patient would like to go to [place] and is calling to find placement.  Elane Fritz, MS3 01/13/2022, 9:29 AM  Attestation for Student Documentation:  I certify that I saw and interviewed the patient together with the medical student and was present for the duration of the interview.  I reviewed the medical record.  I performed or reperformed the mental status examination of the patient as indicated.  I formulated the assessment and plan of treatment as documented above with edits. Viann Fish, MD, Alda Ponder

## 2022-01-13 NOTE — Group Note (Signed)
Date:  01/13/2022 Time:  1:24 PM  Group Topic/Focus:  Wellness Toolbox:   The focus of this group is to discuss various aspects of wellness, balancing those aspects and exploring ways to increase the ability to experience wellness.  Patients will create a wellness toolbox for use upon discharge.    Participation Level:  Minimal  Participation Quality:  Inattentive  Affect:  Appropriate  Cognitive:  Appropriate  Insight: Good  Engagement in Group:  Lacking  Modes of Intervention:  Discussion  Additional Comments:     [name] 01/13/2022, 1:24 PM

## 2022-01-13 NOTE — Progress Notes (Signed)
[facility] LCSW Progress Note   01/13/2022    3:31 PM  Kathryn [name]   [id number]   Type of Contact and Topic:  SUD treatment   CSW confirmed with Vickii Chafe of [facility] that patient has been accepted to facility on 1 Oct. Transportation to be coordinated by CSW team.     Signed:  Durenda Hurt, MSW, LCSWA, LCAS 01/13/2022 3:31 PM

## 2022-01-13 NOTE — Progress Notes (Signed)
   01/13/22 2130  Psych Admission Type (Psych Patients Only)  Admission Status Voluntary  Psychosocial Assessment  Patient Complaints Depression  Eye Contact Fair  Facial Expression Flat  Affect Appropriate to circumstance  Speech Logical/coherent  Interaction Isolative  Motor Activity Slow  Appearance/Hygiene Unremarkable  Behavior Characteristics Appropriate to situation  Mood Depressed;Pleasant  Thought Process  Coherency WDL  Content WDL  Delusions None reported or observed  Perception WDL  Hallucination None reported or observed  Judgment Impaired  Confusion None  Danger to Self  Current suicidal ideation? Denies  Danger to Others  Danger to Others None reported or observed

## 2022-01-13 NOTE — Group Note (Signed)
LCSW Group Therapy Note   Group Date: 01/13/2022 Start Time: 1300 End Time: 1400  Type of Therapy and Topic:  Group Therapy:  Stress Management   Participation Level:  Did Not Attend    Description of Group:  Patients in this group were introduced to the idea of stress and encouraged to discuss negative and positive ways to manage stress. Patients discussed specific stressors that they have in their life right now and the physical signs and symptoms associated with that stress.  Patient encouraged to come up with positive changes to assist with the stress upon discharge in order to prevent future hospitalizations.   They also worked as a group on developing a specific plan for several patients to deal with stressors through [place], psychoeducation and self-care techniques   Therapeutic Goals:               1)  To discuss the positive and negative impacts of stress             2)  identify signs and symptoms of stress             3)  generate ideas for stress management             4)  offer mutual support to others regarding stress management             5)  Developing plans for ways to manage specific stressors upon discharge               Summary of Patient Progress: Did not attend    Therapeutic Modalities:   [place], Latanya Presser 01/13/2022  1:59 PM

## 2022-01-13 NOTE — Progress Notes (Signed)
   01/13/22 0000  Psych Admission Type (Psych Patients Only)  Admission Status Voluntary  Psychosocial Assessment  Patient Complaints Anxiety  Eye Contact Fair  Facial Expression Animated  Affect Anxious  Speech Logical/coherent  Interaction Assertive  Motor Activity Slow  Appearance/Hygiene Unremarkable  Behavior Characteristics Cooperative  Mood Anxious  Thought Process  Coherency WDL  Content WDL  Delusions None reported or observed  Perception WDL  Hallucination None reported or observed  Judgment Impaired  Confusion None  Danger to Self  Current suicidal ideation? Denies  Agreement Not to Harm Self Yes  Description of Agreement verbal  Danger to Others  Danger to Others None reported or observed

## 2022-01-14 DIAGNOSIS — F332 Major depressive disorder, recurrent severe without psychotic features: Secondary | ICD-10-CM | POA: Diagnosis not present

## 2022-01-14 MED ORDER — DULOXETINE HCL 60 MG PO CPEP: 60.0000 mg | ORAL_CAPSULE | Freq: Every day | ORAL | 0 refills | Status: DC

## 2022-01-14 MED ORDER — AMLODIPINE BESYLATE 5 MG PO TABS: 5.0000 mg | ORAL_TABLET | Freq: Every day | ORAL | 0 refills | Status: DC

## 2022-01-14 MED ORDER — PANTOPRAZOLE SODIUM 40 MG PO TBEC: 40.0000 mg | DELAYED_RELEASE_TABLET | Freq: Every day | ORAL | 0 refills | Status: DC

## 2022-01-14 MED ORDER — VITAMIN D (ERGOCALCIFEROL) 1.25 MG (50000 UNIT) PO CAPS: 50000.0000 [IU] | ORAL_CAPSULE | ORAL | 0 refills | Status: DC

## 2022-01-14 MED ORDER — MELATONIN 5 MG PO TABS: 5.0000 mg | ORAL_TABLET | Freq: Every evening | ORAL | 0 refills | Status: AC | PRN

## 2022-01-14 MED ORDER — MELATONIN 5 MG PO TABS: 5.0000 mg | ORAL_TABLET | Freq: Every evening | ORAL | 0 refills | Status: DC | PRN

## 2022-01-14 MED ORDER — NICOTINE 21 MG/24HR TD PT24: 21.0000 mg | MEDICATED_PATCH | Freq: Every day | TRANSDERMAL | 0 refills | Status: AC

## 2022-01-14 MED ORDER — MOMETASONE FURO-FORMOTEROL FUM 100-5 MCG/ACT IN AERO: 2.0000 | INHALATION_SPRAY | Freq: Two times a day (BID) | RESPIRATORY_TRACT | 0 refills | Status: DC

## 2022-01-14 MED ORDER — ANAGRELIDE HCL 1 MG PO CAPS: 1.0000 mg | ORAL_CAPSULE | Freq: Two times a day (BID) | ORAL | 0 refills | Status: DC

## 2022-01-14 MED ORDER — LURASIDONE HCL 40 MG PO TABS: 40.0000 mg | ORAL_TABLET | Freq: Every day | ORAL | 0 refills | Status: DC

## 2022-01-14 MED ORDER — CYANOCOBALAMIN 1000 MCG PO TABS: 1000.0000 ug | ORAL_TABLET | Freq: Every day | ORAL | 0 refills | Status: DC

## 2022-01-14 MED ORDER — NICOTINE 21 MG/24HR TD PT24: 21.0000 mg | MEDICATED_PATCH | Freq: Every day | TRANSDERMAL | 0 refills | Status: DC

## 2022-01-14 NOTE — Discharge Instructions (Signed)
Dear Kathryn [name],  It was a pleasure to take care of you during your stay at [facility] where you were treated for your MDD (major depressive disorder), recurrent severe, without psychosis ([place]).  While you were here, you were:  observed and cared for by our nurses and nursing assistants  treated with medications by your psychiatrists  provided individual and group therapy by therapists  provided resources by our social workers and case managers  Please review the medication list provided to you at discharge and stop, start taking, or continue taking the medications listed there.  You should also follow-up with your primary care doctor, or start seeing one if you don't have one yet. Here are some scheduled follow-ups for you:  [place] Follow up on 01/26/2022.   Why: You have an appointment for therapy services on 01/26/22 at 11:00 am. This will be a Virtual appointment. Contact information: [address] [place], [place] [address] [phone]         [facility] Follow up on 02/06/2022.   Why: You have an appointment for medication management services on 02/06/22 at 11:00 am.  This will be a Virtual telehealth appointment. Contact information: [place] [address] [phone]                 Take care!  Camelia Phenes, MD [facility] (Physician) 01/14/2022 11:09 AM

## 2022-01-14 NOTE — Group Note (Signed)
LCSW Group Therapy Note  01/14/2022    10:00-11:00am   Type of Therapy and Topic:  Group Therapy: Early Messages Received About Anger  Participation Level:  Did Not Attend   Description of Group:   In this group, patients shared and discussed the early messages received in their lives about anger through parental or other adult modeling, teaching, repression, punishment, violence, and more.  Participants identified how those childhood lessons influence even now how they usually or often react when angered.  The group discussed that anger is a secondary emotion and what may be the underlying emotional themes that come out through anger outbursts or that are ignored through anger suppression.    Therapeutic Goals: Patients will identify one or more childhood message about anger that they received and how it was taught to them. Patients will discuss how these childhood experiences have influenced and continue to influence their own expression or repression of anger even today. Patients will explore possible primary emotions that tend to fuel their secondary emotion of anger. Patients will learn that anger itself is normal and cannot be eliminated, and that healthier coping skills can assist with resolving conflict rather than worsening situations.  Summary of Patient Progress:  The patient did not attend this group.  Therapeutic Modalities:   Cognitive Behavioral Therapy Motivation Interviewing  [place], [place] 01/14/2022 11:34 AM

## 2022-01-14 NOTE — Progress Notes (Signed)
  [facility] Adult Case Management Discharge Plan :  Will you be returning to the same living situation after discharge:  No. Pt going to [facility] on 01/15/22 At discharge, do you have transportation home?: Yes,  hospital purchased pt a [employer] bus ticket to reach treatment center. Do you have the ability to pay for your medications: Yes,  has coverage  Release of information consent forms completed and in the chart;  Patient's signature needed at discharge.  Patient to Follow up at:  [facility] Follow up on 01/26/2022.   Why: You have an appointment for therapy services on 01/26/22 at 11:00 am. This will be a Virtual appointment. Contact information: [address] [place], [place] [address] [phone]         [facility] Follow up on 02/06/2022.   Why: You have an appointment for medication management services on 02/06/22 at 11:00 am.  This will be a Virtual telehealth appointment. Contact information: [place] [address] [phone]                 Next level of care provider has access to [place] and Suicide Prevention discussed: Yes,  per weekday staff with friend Philomena Doheny.    Has patient been referred to the Quitline?: Patient refused referral  Patient has been referred for addiction treatment: Yes  Baird Kay, [place] 01/14/2022, 3:03 PM

## 2022-01-14 NOTE — [facility] Group Notes (Signed)
[facility] Group Notes:  (Nursing/MHT/Case Management/Adjunct)  Date:  01/14/2022  Time:  10:05 AM  Type of Therapy:   Goals/ Orientation   Participation Level:  Did Not Attend  Participation Quality:      Affect:      Cognitive:    Insight:  None  Engagement in Group:      Modes of Intervention:      Summary of Progress/Problems:pt. Did not attend,   Bertram Savin 01/14/2022, 10:05 AM

## 2022-01-14 NOTE — Discharge Summary (Addendum)
Physician Discharge Summary Note Patient:  Kathryn [name] is an 51 y.o., female MRN:  [id number] DOB:  1970/04/29 Patient phone:  [phone] (home)  Patient address:   [place] [facility] [address],  Total Time spent with patient: 1.5 hours  Date of Admission:  01/08/2022 Date of Discharge: 01/15/2022  Reason for Admission:  Depression, SI with plan, Cocaine Use  Principal Problem: MDD (major depressive disorder), recurrent severe, without psychosis ([place]) Discharge Diagnoses: Principal Problem:   MDD (major depressive disorder), recurrent severe, without psychosis ([place]) Active Problems:   Hypertension   Chronic pain   Essential thrombocytosis ([place])   Severe stimulant use disorder ([place])   Past Psychiatric History: see HPI  Past Medical History:  Past Medical History:  Diagnosis Date   Anxiety    Arthritis    Asthma    Bipolar disorder ([place])    Chronic back pain    COPD (chronic obstructive pulmonary disease) ([place])    Chronic bronchitis   Depression    Dyspnea    GERD (gastroesophageal reflux disease)    Hypertension    Migraine    Neuropathy    Osteoarthritis of left knee, 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   Single subsegmental pulmonary embolism without acute cor pulmonale ([place]) 06/20/2021   Sleep apnea 04/2020   GETTING A cpap   Suicidal ideation 12/24/2018    Past Surgical History:  Procedure Laterality Date   BACK SURGERY     DILATATION AND CURETTAGE/HYSTEROSCOPY WITH MINERVA N/A 06/09/2020   Procedure: DILATATION AND CURETTAGE/HYSTEROSCOPY WITH MINERVA;  Surgeon: Florian Buff, MD;  Location: [facility] ORS;  Service: Gynecology;  Laterality: N/A;   ESOPHAGOGASTRODUODENOSCOPY (EGD) WITH PROPOFOL N/A 12/25/2018   Procedure: ESOPHAGOGASTRODUODENOSCOPY (EGD) WITH PROPOFOL;  Surgeon: Rogene Houston, MD;  Location: [facility] END[facility] SUITE;  Service: Endoscopy;  Laterality: N/A;   FLEXIBLE SIGMOIDOSCOPY  10/14/2021   Procedure: FLEXIBLE SIGMOIDOSCOPY;  Surgeon: Harvel Quale, MD;  Location: [facility] END[facility] SUITE;  Service: Gastroenterology;;   PATELLA-FEMORAL ARTHROPLASTY Left 10/08/2018   Procedure: PATELLA-FEMORAL ARTHROPLASTY;  Surgeon: Marchia Bond, MD;  Location: [facility] ORS;  Service: Orthopedics;  Laterality: Left;   TUBAL LIGATION     Family History:  Family History  Problem Relation Age of Onset   Gout Paternal Grandfather    Cirrhosis Paternal Grandfather    Hypertension Paternal Grandmother    Aneurysm Paternal Grandmother    Cirrhosis Maternal Grandmother    Cirrhosis Maternal Grandfather    Cancer Father    Cirrhosis Father    Cirrhosis Mother    Breast cancer Sister    Hypertension Sister    Bronchitis Daughter    Bronchitis Daughter    Asthma Son    Bronchitis Son    Migraines Neg Hx    Family Psychiatric  History: see HPI Social History:  Social History   Substance and Sexual Activity  Alcohol Use Yes   Alcohol/week: 4.0 standard drinks of alcohol   Types: 4 Cans of beer per week   Comment: 4 beers Saturday     Social History   Substance and Sexual Activity  Drug Use Yes   Types: Cocaine   Comment: crack  last used June 2023    Social History   Socioeconomic History   Marital status: Single    Spouse name: Not on file   Number of children: 3   Years of education: 10   Highest education level: 10th grade  Occupational History   Not on file  Tobacco Use   Smoking  status: Every Day    Packs/day: 1.00    Years: 10.00    Total pack years: 10.00    Types: Cigarettes    Passive exposure: Never   Smokeless tobacco: Never   Tobacco comments:    Smokes often, especially when using crack cocaine  Vaping Use   Vaping Use: Never used  Substance and Sexual Activity   Alcohol use: Yes    Alcohol/week: 4.0 standard drinks of alcohol    Types: 4 Cans of beer per week    Comment: 4 beers Saturday   Drug use: Yes    Types: Cocaine    Comment: crack  last used June 2023   Sexual activity: Yes    Birth  control/protection: Surgical    Comment: tubal, ablation  Other Topics Concern   Not on file  Social History Narrative   R handed    Lives with boyfriend   1 Cup of caffeine daily    Social Determinants of Health   Financial Resource Strain: Not on file  Food Insecurity: Food Insecurity Present (01/08/2022)   Hunger Vital Sign    Worried About Running Out of Food in the Last Year: Sometimes true    Ran Out of Food in the Last Year: Sometimes true  Transportation Needs: No Transportation Needs (01/08/2022)   PRAPARE - Hydrologist (Medical): No    Lack of Transportation (Non-Medical): No  Physical Activity: Not on file  Stress: Not on file  Social Connections: Not on file    Hospital Course:   During the patient's hospitalization, patient had extensive initial psychiatric evaluation, and follow-up psychiatric evaluations every day.  Psychiatric diagnoses provided upon initial assessment per above.  The following medications were managed: Scheduled  amLODipine  5 mg Oral Daily   anagrelide  1 mg Oral BID   DULoxetine  60 mg Oral Daily   lidocaine  2 patch Transdermal Daily   lurasidone  40 mg Oral Q breakfast   mometasone-formoterol  2 puff Inhalation BID   multivitamin with minerals  1 tablet Oral Daily   nicotine  21 mg Transdermal Daily   pantoprazole  40 mg Oral Daily   senna-docusate  1 tablet Oral BID   thiamine  100 mg Oral Daily   PRN albuterol, alum & mag hydroxide-simeth, calcium carbonate, dicyclomine, hydrOXYzine, magnesium hydroxide, melatonin, methocarbamol  During the hospitalization, patient had the following lab / imaging / testing abnormalities which require further evaluation / management / treatment: thrombocytosis  Patient's care was discussed during the interdisciplinary team meeting every day during the hospitalization.  The patient denies having side effects to prescribed psychiatric medication.  Kathryn [name] is a  51 y.o. female with psychiatric hx of MDD, substance-induced psychotic disorder, polysubstance abuse, severe stimulant use disorder-cocaine type who [facility] to [facility] reporting increased depression and suicidal ideations with plan to cut her wrists. Now admitted to [facility] for further treatment.  Gradually, patient started adjusting to milieu. The patient was evaluated each day by a clinical provider to ascertain response to treatment. Improvement was noted by the patient's report of decreasing symptoms, improved sleep and appetite, affect, medication tolerance, behavior, and participation in unit programming.  Patient was asked each day to complete a self inventory noting mood, mental status, pain, new symptoms, anxiety and concerns.    Symptoms were reported as significantly decreased or resolved completely by discharge.   On day of discharge, the patient reports that their mood is stable. The  patient denied having suicidal thoughts for more than 48 hours prior to discharge.  Patient denies having homicidal thoughts.  Patient denies having auditory hallucinations.  Patient denies any visual hallucinations or other symptoms of psychosis. The patient was motivated to continue taking medication with a goal of continued improvement in mental health.   The patient reports their target psychiatric symptoms of MDD (major depressive disorder), recurrent severe, without psychosis ([place]) responded well to the psychiatric medications, and the patient reports overall benefit other psychiatric hospitalization. Supportive psychotherapy was provided to the patient. The patient also participated in regular group therapy while hospitalized. Coping skills, problem solving as well as relaxation therapies were also part of the unit programming.  Labs were reviewed with the patient, and abnormal results were discussed with the patient.  The patient is able to verbalize their individual safety plan to this provider.   # It is  recommended to the patient to continue psychiatric medications as prescribed, after discharge from the hospital.    # It is recommended to the patient to follow up with your outpatient psychiatric provider and PCP.  # It was discussed with the patient, the impact of alcohol, drugs, tobacco have been there overall psychiatric and medical wellbeing, and total abstinence from substance use was recommended to the patient.  # Prescriptions provided or sent directly to preferred pharmacy at discharge. Patient agreeable to plan. Given opportunity to ask questions. Appears to feel comfortable with discharge.    # In the event of worsening symptoms, the patient is instructed to call the crisis hotline, 911 and or go to the nearest ED for appropriate evaluation and treatment of symptoms. To follow-up with primary care provider for other medical issues, concerns and or health care needs  # Patient was discharged home with a plan to follow up as noted below.  Physical Findings: AIMS:  , ,  ,  ,    CIWA:  CIWA-Ar Total: 0 COWS:     Mental Status Exam:   Appearance and Grooming: Patient is casually dressed in t-shirt and joggers . The patient has no noticeable scent or odor.   Behavior: The patient appears in no acute distress, and during the interview, was calm, focused, required minimal redirection, and behaving appropriately to scenario; she was able to follow commands and compliant to requests and made good eye contact.   Attitude: Patient was cooperative and open during the interview.   Motor activity: The patient's movement speed was normal; her gait was shuffling. There was no notable abnormal facial movements and resting tremor noted for abnormal extremity movements, though when closely observed appeared under voluntary control as it was situational (when I was speaking directly to patient) and distractible (disappeared when patient did not notice being observed from afar).   Speech: The  patient's speech was clear, fluent, with good articulation, and with appropriately placed inflections. The volume of her speech was normal and normal in quantity. The rate was normal with a normal rhythm. Responses were normal in latency. There were no abnormal patterns in speech.   Mood: "Okay"   Affect: Patient's affect is euthymic with broad range and even fluctuations; her affect is congruent with her stated mood. -------------------------------------------------------------------------------------------------------------------------   Thought Content The patient experiences no hallucinations. The patient describes no delusional thoughts; she denies thought insertion, denies thought withdrawal, denies thought interruption, and denies thought broadcasting.   Patient denies active suicidal intent and denies passive suicidal ideation; she denies homicidal intent.   Thought Process The  patient's thought process is linear and goal-directed.   Insight The patient demonstrates fair insight, as evidenced by understanding of her mental health conditions, limited understanding of her substance use dependencies.   Judgement The patient demonstrates fair judgement, as evidenced by help-seeking behavior, such as voluntarily being admitted to [facility], adhering to medication regimen, actively participating in group therapy, though demonstrates substance seeking behavior.  Physical Exam Vitals and nursing note reviewed.  Constitutional:      Appearance: Normal appearance.  HENT:     Head: Normocephalic and atraumatic.  Pulmonary:     Effort: Pulmonary effort is normal.  Neurological:     General: No focal deficit present.     Mental Status: She is alert. Mental status is at baseline.    Review of Systems  Constitutional: Negative.   Respiratory: Negative.    Cardiovascular: Negative.   Gastrointestinal: Negative.   Genitourinary: Negative.   Neurological:  Positive for tremors.    Blood  pressure 127/84, pulse 93, temperature 98.8 F (37.1 C), temperature source Oral, resp. rate 20, height '4\' 11"'$  (1.499 m), weight 85.3 kg, last menstrual period 06/07/2020, SpO2 97 %. Body mass index is 37.97 kg/m.  Assets  Assets:Resilience; Communication Skills   Social History   Tobacco Use  Smoking Status Every Day   Packs/day: 1.00   Years: 10.00   Total pack years: 10.00   Types: Cigarettes   Passive exposure: Never  Smokeless Tobacco Never  Tobacco Comments   Smokes often, especially when using crack cocaine   Tobacco Cessation:  A prescription for an FDA-approved tobacco cessation medication provided at discharge   Blood Alcohol level:  Lab Results  Component Value Date   [facility] <10 01/06/2022   ETH <10 16/01/9603    Metabolic Disorder Labs:  Lab Results  Component Value Date   HGBA1C 5.5 01/10/2022   MPG 111.15 01/10/2022   MPG 111.15 10/19/2021   No results found for: "PROLACTIN" Lab Results  Component Value Date   CHOL 168 01/10/2022   TRIG 56 01/10/2022   HDL 57 01/10/2022   CHOLHDL 2.9 01/10/2022   VLDL 11 01/10/2022   LDLCALC 100 (H) 01/10/2022   LDLCALC 84 10/19/2021    Discharge destination:  Other:  rehab facility  Is patient on multiple antipsychotic therapies at discharge:  No   Has Patient had three or more failed trials of antipsychotic monotherapy by history:  No  Recommended Plan for Multiple Antipsychotic Therapies: NA  DISCHARGE MEDICATIONS:   amLODipine  5 mg Oral Daily   anagrelide  1 mg Oral BID   DULoxetine  60 mg Oral Daily   lidocaine  2 patch Transdermal Daily   lurasidone  40 mg Oral Q breakfast   mometasone-formoterol  2 puff Inhalation BID   multivitamin with minerals  1 tablet Oral Daily   nicotine  21 mg Transdermal Daily   pantoprazole  40 mg Oral Daily   senna-docusate  1 tablet Oral BID   thiamine  100 mg Oral Daily      Follow-up [facility] Follow up on  01/26/2022.   Why: You have an appointment for therapy services on 01/26/22 at 11:00 am. This will be a Virtual appointment. Contact information: [address] [place], [place] [address] [phone]         [facility] Follow up on 02/06/2022.   Why: You have an appointment for medication management services on 02/06/22 at  11:00 am.  This will be a Virtual telehealth appointment. Contact information: [place] [address] [phone]                 Discharge recommendations:   Activity: as tolerated  Diet: heart healthy  # It is recommended to the patient to continue psychiatric medications as prescribed, after discharge from the hospital.     # It is recommended to the patient to follow up with your outpatient psychiatric provider -instructions on appointment date, time, and address (location) are provided to you in discharge paperwork  # Follow-up with outpatient primary care doctor and other specialists -for management of chronic medical disease, including: has Hypertension; Lumbar spinal stenosis; Low vitamin B12 level; OA (osteoarthritis) of knee; Enlarged uterus; Suicidal ideation; Intermittent palpitations; Excessive daytime sleepiness; Insomnia; Moderate persistent asthma without complication; Body mass index (BMI) 38.0-38.9, adult; GERD (gastroesophageal reflux disease); Spondylolisthesis at L4-L5 level; S/P lumbar fusion; History of DVT of lower extremity; Bilateral leg edema; Morbid obesity ([place]); Encounter for examination following treatment at hospital; Preoperative examination; 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  # Testing: Follow-up with outpatient provider for abnormal lab results: thrombocytosis   # It was discussed with the patient, the impact of alcohol, drugs, tobacco have been there overall psychiatric and medical wellbeing, and total abstinence from substance use was recommended to the patient.   # Prescriptions provided or sent directly to preferred pharmacy at discharge. Patient agreeable to plan. Given opportunity to ask questions. Appears to feel comfortable with discharge.    # In the event of worsening symptoms, the patient is instructed to call the crisis hotline, 911, and or go to the nearest ED for appropriate evaluation and treatment of symptoms. To follow-up with primary care provider for other medical issues, concerns and or health care needs  Patient agrees with D/C instructions and plan.   Total Time Spent in Direct Patient Care:  I personally spent 60 minutes on the unit in direct patient care. The direct patient care time included face-to-face time with the patient, reviewing the patient's chart, communicating with other professionals, and coordinating care. Greater than 50% of this time was spent in counseling or coordinating care with the patient regarding goals of hospitalization, psycho-education, and discharge planning needs.   I discussed my assessment, planned testing and intervention for the patient with Dr. Danella Sensing who agrees with my formulated course of action.  Signed: Camelia Phenes, MD, PGY-1 01/14/2022, 11:20 AM  ADDENDUM: Total Time Spent in Direct Patient Care:  I personally spent 35 minutes on the unit in direct patient care. The direct patient care time included face-to-face time with the patient, reviewing the patient's chart, communicating with  other professionals, and coordinating care. Greater than 50% of this time was spent in counseling or coordinating care with the patient regarding goals of hospitalization, psycho-education, and discharge planning needs.  On my assessment the patient denied SI, HI, AVH, paranoia, ideas of reference, or first rank symptoms on day of discharge. Patient denied drug cravings or active signs of withdrawal. Patient denied medication side-effects. Patient was not deemed to be a danger  to self or others on day of discharge and was in agreement with discharge plans.   I have independently evaluated the patient during a face-to-face assessment on the day of discharge. I reviewed the patient's chart, and I participated in key portions of the service. I discussed the case with the resident physician, and I agree with the assessment and plan of care as documented in the resident physician's note, as addended by me or notated below: The patient was admitted to the inpatient psychiatric unit due to symptoms of depression without psychosis, suicidal ideation, and anxiety. She was restricted to ward and placed on suicide precautions. Patient was introduced to milieu activities and encouraged to participate in psycho-social groups. The plan at the time of admission was safety, stabilization and treatment.  Patient was treated with a combination of medication, therapy, and some educational activities during her hospital stay. She participated in individual and group therapy sessions, and attended various educational groups and participated in activities to help her manage her emotional and mental health. For the management of depressive disorder, patient was continued on home medication Cymbalta at 60 mg p.o. daily and Latuda '40mg'$  po daily.  Patient was monitored on CIWA protocol. Patient never required as needed medications for agitation or psychosis.  Patient has chronically-elevated platelets and is followed by hen/onc and  on Agrylin. She had old DVT but no longer is on Eliquis. She was discharged with referral to [facility]. At the time of discharge, patient was able to manage her symptoms of depression, anxiety, and suicidal ideation. She was able to identify her triggers and develop coping skills to help her manage her emotions. She was also able to develop positive relationships with staff and peers on the unit. Patient is being discharged with a follow-up appointment/referral to an outpatient mental health provider. Patient also has access to a 24-hour crisis hotline and other community resources should she need additional support. Patient has been instructed to take her medications as prescribed, continue participating in therapy, and practice healthy lifestyle habits such as getting enough sleep and exercise. She was also reminded to reach out to her support system and the community resources available to her.  Larita Fife, MD Psychiatrist

## 2022-01-14 NOTE — [facility] Group Notes (Signed)
[facility] Group Notes:  (Nursing/MHT/Case Management/Adjunct)  Date:  01/14/2022  Time:  10:36 PM  Type of Therapy:  Psychoeducational Skills  Participation Level:  Did Not Attend= DNA  Participation Quality:   DNA  Affect:   DNA  Cognitive:   DNA  Insight:  None  Engagement in Group:   DNA  Modes of Intervention:   DNA  Summary of Progress/Problems:  Kathryn [name] 01/14/2022, 10:36 PM

## 2022-01-14 NOTE — Group Note (Signed)
Date:  01/14/2022 Time:  4:04 PM  Group Topic/Focus:  Anger Management  Additional Comments:  Pt was provided with anger management materials and encouraged to come to staff with any questions.   Kathryn [name] 01/14/2022, 4:04 PM

## 2022-01-14 NOTE — Progress Notes (Addendum)
[facility] MD Progress Note  01/14/2022 10:32 AM Kathryn [name]  MRN:  [id number]  Principal Problem: MDD (major depressive disorder), recurrent severe, without psychosis ([place]) Diagnosis: Principal Problem:   MDD (major depressive disorder), recurrent severe, without psychosis ([place]) Active Problems:   Hypertension   Chronic pain   Essential thrombocytosis ([place])   Severe stimulant use disorder ([place])  Reason for Admission: Kathryn [name] is a 51 y.o. female with a past psychiatric hx of schizoaffective d/o bipolar type vs substance induced psychosis, MDD, GAD, and polysubstance abuse, who presented to [facility] reporting increased depression and suicidal ideations with plan to cut her wrists. Now admitted to [facility] voluntarily for further treatment.This is hospitalization day 6.  Chart Review from last 24 hours:  Patient received all scheduled medications Patient received the following as needed medications: Methocarbamol  Subjective:  Patient was found laying in bed covered under the sheets. She states that she is no longer depressed nor suicidal.  Patient endorsed tremors which she attributes to withdrawal symptoms.  I informed the patient she should take her scheduled medications in the morning to which she agreed. She denies any other somatic complaints.  Patient tells me she is ready for discharge and she has a bus ticket for [employer] for rehab.  Patient denies SI/HI.  She denies AVH.  She denies thought insertion, withdrawal, broadcasting.  Total Time Spent in Direct Patient Care:  I personally spent 60 minutes on the unit in direct patient care. The direct patient care time included face-to-face time with the patient, reviewing the patient's chart, communicating with other professionals, and coordinating care. Greater than 50% of this time was spent in counseling or coordinating care with the patient regarding goals of hospitalization, psycho-education, and discharge planning needs.  Past  Psychiatric History: see H&P  Past Medical History:  Past Medical History:  Diagnosis Date   Anxiety    Arthritis    Asthma    Bipolar disorder ([place])    Chronic back pain    COPD (chronic obstructive pulmonary disease) (HCC)    Chronic bronchitis   Depression    Dyspnea    GERD (gastroesophageal reflux disease)    Hypertension    Migraine    Neuropathy    Osteoarthritis of left knee, 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   Single subsegmental pulmonary embolism without acute cor pulmonale ([place]) 06/20/2021   Sleep apnea 04/2020   GETTING A cpap   Suicidal ideation 12/24/2018    Past Surgical History:  Procedure Laterality Date   BACK SURGERY     DILATATION AND CURETTAGE/HYSTEROSCOPY WITH MINERVA N/A 06/09/2020   Procedure: DILATATION AND CURETTAGE/HYSTEROSCOPY WITH MINERVA;  Surgeon: Florian Buff, MD;  Location: [facility]S;  Service: Gynecology;  Laterality: N/A;   ESOPHAGOGASTRODUODENOSCOPY (EGD) WITH PROPOFOL N/A 12/25/2018   Procedure: ESOPHAGOGASTRODUODENOSCOPY (EGD) WITH PROPOFOL;  Surgeon: Rogene Houston, MD;  Location: [facility];  Service: Endoscopy;  Laterality: N/A;   FLEXIBLE SIGMOIDOSCOPY  10/14/2021   Procedure: FLEXIBLE SIGMOIDOSCOPY;  Surgeon: Harvel Quale, MD;  Location: [facility];  Service: Gastroenterology;;   PATELLA-FEMORAL ARTHROPLASTY Left 10/08/2018   Procedure: PATELLA-FEMORAL ARTHROPLASTY;  Surgeon: Marchia Bond, MD;  Location: [facility]S;  Service: Orthopedics;  Laterality: Left;   TUBAL LIGATION     Family History:  Family History  Problem Relation Age of Onset   Gout Paternal Grandfather    Cirrhosis Paternal Grandfather    Hypertension Paternal Grandmother    Aneurysm Paternal Grandmother    Cirrhosis Maternal Grandmother  Cirrhosis Maternal Grandfather    Cancer Father    Cirrhosis Father    Cirrhosis Mother    Breast cancer Sister    Hypertension Sister    Bronchitis Daughter    Bronchitis Daughter    Asthma Son    Bronchitis  Son    Migraines Neg Hx    Family Psychiatric  History: see H&P  Social History:  Social History   Substance and Sexual Activity  Alcohol Use Yes   Alcohol/week: 4.0 standard drinks of alcohol   Types: 4 Cans of beer per week   Comment: 4 beers Saturday     Social History   Substance and Sexual Activity  Drug Use Yes   Types: Cocaine   Comment: crack  last used June 2023    Social History   Socioeconomic History   Marital status: Single    Spouse name: Not on file   Number of children: 3   Years of education: 10   Highest education level: 10th grade  Occupational History   Not on file  Tobacco Use   Smoking status: Every Day    Packs/day: 1.00    Years: 10.00    Total pack years: 10.00    Types: Cigarettes    Passive exposure: Never   Smokeless tobacco: Never   Tobacco comments:    Smokes often, especially when using crack cocaine  Vaping Use   Vaping Use: Never used  Substance and Sexual Activity   Alcohol use: Yes    Alcohol/week: 4.0 standard drinks of alcohol    Types: 4 Cans of beer per week    Comment: 4 beers Saturday   Drug use: Yes    Types: Cocaine    Comment: crack  last used June 2023   Sexual activity: Yes    Birth control/protection: Surgical    Comment: tubal, ablation  Other Topics Concern   Not on file  Social History Narrative   R handed    Lives with boyfriend   1 Cup of caffeine daily    Social Determinants of Health   Financial Resource Strain: Not on file  Food Insecurity: Food Insecurity Present (01/08/2022)   Hunger Vital Sign    Worried About Running Out of Food in the Last Year: Sometimes true    Ran Out of Food in the Last Year: Sometimes true  Transportation Needs: No Transportation Needs (01/08/2022)   PRAPARE - Hydrologist (Medical): No    Lack of Transportation (Non-Medical): No  Physical Activity: Not on file  Stress: Not on file  Social Connections: Not on file    Current  Medications: Current Facility-Administered Medications  Medication Dose Route Frequency Provider Last Rate Last Admin   albuterol (VENTOLIN HFA) 108 (90 Base) MCG/ACT inhaler 1 puff  1 puff Inhalation Q6H PRN France Ravens, MD   1 puff at 01/12/22 0754   alum & mag hydroxide-simeth (MAALOX/MYLANTA) 200-200-20 MG/5ML suspension 30 mL  30 mL Oral Q4H PRN France Ravens, MD   30 mL at 01/11/22 1504   amLODipine (NORVASC) tablet 5 mg  5 mg Oral Daily France Ravens, MD   5 mg at 01/14/22 0093   anagrelide (AGRYLIN) capsule 1 mg  1 mg Oral BID France Ravens, MD   1 mg at 01/14/22 8182   calcium carbonate (TUMS - dosed in mg elemental calcium) chewable tablet 200 mg of elemental calcium  1 tablet Oral BID PRN Harlow Asa, MD   200  mg of elemental calcium at 01/12/22 2127   dicyclomine (BENTYL) tablet 20 mg  20 mg Oral Q6H PRN Harlow Asa, MD   20 mg at 01/12/22 1426   DULoxetine (CYMBALTA) DR capsule 60 mg  60 mg Oral Daily France Ravens, MD   60 mg at 01/14/22 0813   hydrOXYzine (ATARAX) tablet 50 mg  50 mg Oral Q6H PRN France Ravens, MD   50 mg at 01/12/22 1709   lidocaine (LIDODERM) 5 % 2 patch  2 patch Transdermal Daily France Ravens, MD   2 patch at 01/14/22 0813   lurasidone (LATUDA) tablet 40 mg  40 mg Oral Q breakfast France Ravens, MD   40 mg at 01/14/22 9528   magnesium hydroxide (MILK OF MAGNESIA) suspension 30 mL  30 mL Oral Daily PRN France Ravens, MD   30 mL at 01/11/22 0942   melatonin tablet 5 mg  5 mg Oral QHS PRN France Ravens, MD   5 mg at 01/13/22 2129   methocarbamol (ROBAXIN) tablet 500 mg  500 mg Oral Q8H PRN Harlow Asa, MD   500 mg at 01/14/22 4132   mometasone-formoterol (DULERA) 100-5 MCG/ACT inhaler 2 puff  2 puff Inhalation BID France Ravens, MD   2 puff at 01/14/22 0813   multivitamin with minerals tablet 1 tablet  1 tablet Oral Daily Harlow Asa, MD   1 tablet at 01/14/22 4401   nicotine (NICODERM CQ - dosed in mg/24 hours) patch 21 mg  21 mg Transdermal Daily Attiah, Nadir, MD   21 mg at  01/14/22 0814   pantoprazole (PROTONIX) EC tablet 40 mg  40 mg Oral Daily Viann Fish E, MD   40 mg at 01/14/22 0815   senna-docusate (Senokot-S) tablet 1 tablet  1 tablet Oral BID Harlow Asa, MD   1 tablet at 01/14/22 0815   thiamine (Vitamin B-1) tablet 100 mg  100 mg Oral Daily Harlow Asa, MD   100 mg at 01/14/22 0272    Lab Results:  No results found for this or any previous visit (from the past 24 hour(s)).   Blood Alcohol level:  Lab Results  Component Value Date   ETH <10 01/06/2022   ETH <10 53/66/4403    Metabolic Disorder Labs: Lab Results  Component Value Date   HGBA1C 5.5 01/10/2022   MPG 111.15 01/10/2022   MPG 111.15 10/19/2021   No results found for: "PROLACTIN" Lab Results  Component Value Date   CHOL 168 01/10/2022   TRIG 56 01/10/2022   HDL 57 01/10/2022   CHOLHDL 2.9 01/10/2022   VLDL 11 01/10/2022   LDLCALC 100 (H) 01/10/2022   LDLCALC 84 10/19/2021    Mental Status Exam:  Appearance and Grooming: Patient is casually dressed in t-shirt and joggers . The patient has no noticeable scent or odor.  Behavior: The patient appears in no acute distress, and during the interview, was calm, focused, required minimal redirection, and behaving appropriately to scenario; she was able to follow commands and compliant to requests and made good eye contact.  Attitude: Patient was cooperative and open during the interview.  Motor activity: The patient's movement speed was normal; her gait was shuffling. There was no notable abnormal facial movements and resting tremor noted for abnormal extremity movements, though when closely observed appeared under voluntary control as it was situational (when I was speaking directly to patient) and distractible (disappeared when patient did not notice being observed from afar).  Speech: The patient's  speech was clear, fluent, with good articulation, and with appropriately placed inflections. The volume of her  speech was normal and normal in quantity. The rate was normal with a normal rhythm. Responses were normal in latency. There were no abnormal patterns in speech.  Mood: "Okay"  Affect: Patient's affect is euthymic with broad range and even fluctuations; her affect is congruent with her stated mood. -------------------------------------------------------------------------------------------------------------------------  Thought Content The patient experiences no hallucinations. The patient describes no delusional thoughts; she denies thought insertion, denies thought withdrawal, denies thought interruption, and denies thought broadcasting.  Patient denies active suicidal intent and denies passive suicidal ideation; she denies homicidal intent.  Thought Process The patient's thought process is linear and goal-directed.  Insight The patient demonstrates fair insight, as evidenced by understanding of her mental health conditions, limited understanding of her substance use dependencies.  Judgement The patient demonstrates fair judgement, as evidenced by help-seeking behavior, such as voluntarily being admitted to [facility], adhering to medication regimen, actively participating in group therapy, though demonstrates substance seeking behavior.  Physical Exam: Blood pressure 127/84, pulse 93, temperature 98.8 F (37.1 C), temperature source Oral, resp. rate 20, height '4\' 11"'$  (1.499 m), weight 85.3 kg, last menstrual period 06/07/2020, SpO2 97 %. Body mass index is 37.97 kg/m.  Physical Exam Vitals and nursing note reviewed.  HENT:     Head: Normocephalic.  Pulmonary:     Effort: Pulmonary effort is normal.  Neurological:     General: No focal deficit present.     Mental Status: She is alert and oriented to person, place, and time.     Motor: No tremor.   Review of Systems  Gastrointestinal:  Negative for constipation, diarrhea, nausea and vomiting.  Neurological:  Positive for tremors.    PLAN Safety and Monitoring: Voluntary admission to inpatient psychiatric unit for safety, stabilization and treatment Daily contact with patient to assess and evaluate symptoms and progress in treatment Patient's case to be discussed in multi-disciplinary team meeting Observation Level : q15 minute checks Vital signs: q12 hours Precautions: suicide, elopement, and assault   Psychiatric Diagnoses and Treatment  MDD-Severe, recurrent episode without psychosis (r/o schizoaffective d/o by hx, r/o substance induced mood d/o, r/o bipolar depression)             -- Continue Latuda (lurasidone) 40 mg for mood stabilization  Metabolic profile and EKG monitoring obtained while on an atypical antipsychotic (BMI: 37.97 Lipid Panel: LDL 100, otw WNL HbgA1c: 5.5; QTc: 440)              -- Continue Cymbalta (duloxetine) 60 mg for depression symptoms  -- Continue melatonin '5mg'$  qhs PRN insomnia -- The risks/benefits/side-effects/alternatives to these medications were discussed in detail with the patient and time was given for questions. The patient consents to medication trials.              -- Encouraged patient to participate in unit milieu and in scheduled group therapies   Stimulant use disorder-cocaine R/o alcohol use disorder Nicotine use disorder            -- patient demonstrating situational and distractible tremors, CIWA monitoring discontinued. Will continue to observe  -- PRNs for symptomatic withdrawal discontinued at this time  -- MVI and thiamine oral replacement  -- Counseled on need to abstain from substance use and patient interested in [facility] at discharge or residential treatment - SW also to assist with residential rehab referrals which are pending review  Medical Problems Being Addressed Hx  of DVT/PE in May 2022 Completed treatment regimen. No new acute symptoms. Last admission in July 2023, IM was consulted and recommended to NOT resume eliquis as no indication for it.  She was resumed on eliquis in ED for no clear reason.  -Monitor for new unilateral sharp pain with associated swelling. These are not present today.   Chronic pain secondary to spondylolisthesis s/p lumbar fusion May 2022 Associated neuropathy -Previously was on pregabalin but switched to gabapentin due to abuse potential. Patient refused gabapentin.  - Lidoderm patch daily for pain  Hypertension BP 120/80 -Amlodipine 5 mg daily -Clonidine was discontinued as patient has not been taking and BP wnl   Essential thrombocytosis Follows with Dr. Lindi Adie (last seen 09/2021, recommended continuing anagrelide with followup in ~3 months) - Continue anagrelide 1 mg bid - Platelets 637 on 9/26 - OP follow up with Dr. Lindi Adie   BLE edema, stable -Discontinue lasix and monitoring for sx -Outpatient PCP follow up  Constipation -- Encouraged PRN MOM and continue Senokot S tab bid  GERD -- start Protonix '40mg'$  daily and TUMS bid PRN, seems to be improving  Nicotine use d/o -- counseled on smoking cessation -- Nicotine patch '21mg'$ /24 hours daily   COPD -- continue Albuterol MDI PRN -- Continue Dulera 2 puffs daily  4. Discharge Planning: Social work and case management to assist with discharge planning and identification of hospital follow-up needs prior to discharge Estimated LOS: 5-7 days Discharge Concerns: Need to establish a safety plan; Medication compliance and effectiveness Discharge Goals: Return home with outpatient referrals for mental health follow-up including medication management/psychotherapy. Patient would like to go to [facility] and is calling to find placement.  Camelia Phenes, MD [facility] Psychiatry Intern (PGY-1) 01/14/2022 10:32 AM  Total Time Spent in Direct Patient Care:  I personally spent 35 minutes on the unit in direct patient care. The direct patient care time included face-to-face time with the patient, reviewing the patient's chart, communicating with other  professionals, and coordinating care. Greater than 50% of this time was spent in counseling or coordinating care with the patient regarding goals of hospitalization, psycho-education, and discharge planning needs.  I have independently evaluated the patient during a face-to-face assessment on 01/14/22. I reviewed the patient's chart, and I participated in key portions of the service. I discussed the case with the Doctor, hospital, and I agree with the assessment and plan of care as documented in the House Officer's note, as addended by me or notated below: patient continues to report good mood, denies worsened depression, anxiety, denies suicidal or homicidal thoughts, plans. Physical complaint today - constipation - prn medication administered. Anticipated discharge tomorrow.   Larita Fife, MD Psychiatrist

## 2022-01-14 NOTE — Progress Notes (Signed)
   01/14/22 2000  Psych Admission Type (Psych Patients Only)  Admission Status Voluntary  Psychosocial Assessment  Patient Complaints None  Eye Contact Fair  Facial Expression Flat  Affect Appropriate to circumstance  Speech Logical/coherent  Interaction Isolative  Motor Activity Slow  Appearance/Hygiene Unremarkable  Behavior Characteristics Appropriate to situation  Mood Depressed;Pleasant  Thought Process  Coherency WDL  Content WDL  Delusions None reported or observed  Perception WDL  Hallucination None reported or observed  Judgment Poor  Confusion None  Danger to Self  Current suicidal ideation? Denies  Danger to Others  Danger to Others None reported or observed

## 2022-01-14 NOTE — Progress Notes (Signed)
   01/14/22 0900  Psych Admission Type (Psych Patients Only)  Admission Status Voluntary  Psychosocial Assessment  Patient Complaints Depression;Anxiety  Eye Contact Fair  Facial Expression Flat  Affect Appropriate to circumstance  Speech Logical/coherent  Interaction Guarded  Motor Activity Slow  Appearance/Hygiene Unremarkable  Behavior Characteristics Anxious  Mood Depressed;Anxious;Pleasant  Thought Process  Coherency WDL  Content WDL  Delusions WDL  Perception WDL  Hallucination None reported or observed  Judgment Poor  Confusion None  Danger to Self  Current suicidal ideation? Denies  Danger to Others  Danger to Others None reported or observed

## 2022-01-14 NOTE — [facility] Suicide Risk Assessment (Addendum)
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   The patient denies having side effects to prescribed psychiatric medication.   Kathryn [name] is a 51 y.o. female with psychiatric hx of MDD, substance-induced psychotic disorder, polysubstance abuse, severe stimulant use disorder-cocaine type who presented to [facility] reporting increased depression and suicidal ideations with plan to cut her wrists. Now admitted to [facility] for further treatment.   Gradually, patient started adjusting to milieu. The patient was evaluated each day by a clinical provider to ascertain response to treatment. Improvement was noted by the patient's report of decreasing symptoms, improved sleep and appetite, affect, medication tolerance, behavior, and participation in unit programming.  Patient was asked each day to complete a self inventory noting mood, mental status, pain, new symptoms, anxiety and concerns.     Symptoms were reported as significantly decreased or resolved completely by discharge.    On day of discharge, the patient reports that their mood is stable. The patient denied having suicidal thoughts for more than 48 hours prior to discharge.  Patient denies having homicidal thoughts.  Patient denies having auditory hallucinations.  Patient  denies any visual hallucinations or other symptoms of psychosis. The patient was motivated to continue taking medication with a goal of continued improvement in mental health.    The patient reports their target psychiatric symptoms of MDD (major depressive disorder), recurrent severe, without psychosis ([place]) responded well to the psychiatric medications, and the patient reports overall benefit other psychiatric hospitalization. Supportive psychotherapy was provided to the patient. The patient also participated in regular group therapy while hospitalized. Coping skills, problem solving as well as relaxation therapies were also part of the unit programming.   Labs were reviewed with the patient, and abnormal results were discussed with the patient.   The patient is able to verbalize their individual safety plan to this provider.     # It is recommended to the patient to continue psychiatric medications as prescribed, after discharge from the [name].     # It is recommended to the patient to follow up with your outpatient psychiatric provider and PCP.   # It was discussed with the patient, the impact of alcohol, drugs, tobacco have been there overall psychiatric and medical wellbeing, and total abstinence from substance use was recommended to the patient.   # Prescriptions provided or sent directly to preferred pharmacy at discharge. Patient agreeable to plan. Given opportunity to ask questions. Appears to feel comfortable with discharge.    # In the event of worsening symptoms, the patient is instructed to call the crisis hotline, 911 and or go to the nearest ED for appropriate evaluation and treatment of symptoms. To follow-up with primary care provider for other medical issues, concerns and or health care needs   # Patient was discharged home with a plan to follow up as noted below.   Total Time spent with patient: 1.5 hours  Mental  Status Exam:   Appearance and Grooming: Patient is  casually dressed in t-shirt and joggers . The patient has no noticeable scent or odor.   Behavior: The patient appears in no acute distress, and during the interview, was calm, focused, required minimal redirection, and behaving appropriately to scenario; she was able to follow commands and compliant to requests and made good eye contact.   Attitude: Patient was cooperative and open during the interview.   Motor activity: The patient's movement speed was normal; her gait was shuffling. There was no notable abnormal facial movements and resting tremor noted for abnormal extremity movements, though when closely observed appeared under voluntary control as it was situational (when I was speaking directly to patient) and distractible (disappeared when patient did not notice being observed from afar).   Speech: The patient's speech was clear, fluent, with good articulation, and with appropriately placed inflections. The volume of her speech was normal and normal in quantity. The rate was normal with a normal rhythm. Responses were normal in latency. There were no abnormal patterns in speech.   Mood: "Okay"   Affect: Patient's affect is euthymic with broad range and even fluctuations; her affect is congruent with her stated mood. -------------------------------------------------------------------------------------------------------------------------   Thought Content The patient experiences no hallucinations. The patient describes no delusional thoughts; she denies thought insertion, denies thought withdrawal, denies thought interruption, and denies thought broadcasting.   Patient denies active suicidal intent and denies passive suicidal ideation; she denies homicidal intent.   Thought Process The patient's thought process is linear and goal-directed.   Insight The patient demonstrates fair insight, as evidenced by understanding of her mental health conditions, limited understanding of her  substance use dependencies.   Judgement The patient demonstrates fair judgement, as evidenced by help-seeking behavior, such as voluntarily being admitted to [name], adhering to medication regimen, actively participating in group therapy, though demonstrates substance seeking behavior.   Physical Exam Vitals and nursing note reviewed.  Constitutional:      Appearance: Normal appearance.  HENT:     Head: Normocephalic and atraumatic.  Pulmonary:     Effort: Pulmonary effort is normal.  Neurological:     General: No focal deficit present.     Mental Status: She is alert. Mental status is at baseline.    Review of Systems  Constitutional: Negative.   Respiratory: Negative.    Cardiovascular: Negative.   Gastrointestinal: Negative.   Genitourinary: Negative.   Neurological:  Positive for tremors.    Blood pressure 127/84, pulse 93, temperature 98.8 F (37.1 C), temperature source Oral, resp. rate 20, height '4\' 11"'$  (1.499 m), weight 85.3 kg, last menstrual period 06/07/2020, SpO2 97 %. Body mass index is 37.97 kg/m.  Mental Status Per Nursing Assessment::   On Admission:  Suicidal ideation indicated by patient  Demographic Factors:  Low socioeconomic status and Unemployed  Loss Factors: Decline in physical health and Financial problems/change in socioeconomic status  Historical Factors: Prior suicide attempts  Risk Reduction Factors:   NA  Continued Clinical Symptoms:  Chronic Pain More than one psychiatric diagnosis Previous Psychiatric Diagnoses and Treatments Medical Diagnoses and Treatments/Surgeries  Cognitive Features That Contribute To Risk:  None    Suicide Risk:  Acute Risk:  Minimal: No identifiable suicidal ideation.  Patients presenting with no risk factors but with morbid ruminations; may be classified as minimal risk based on the severity of the depressive symptoms  Chronic Risk:  Mild:  Suicidal ideation of limited frequency, intensity, duration, and  specificity.  There are no identifiable plans, no associated intent, mild dysphoria and related symptoms, good self-control (both objective and subjective assessment), few other risk factors, and identifiable protective factors, including available and accessible social support.   [facility] Follow up on 01/26/2022.   Why: You have an appointment for therapy services on 01/26/22 at 11:00 am. This will be a Virtual appointment. Contact information: [address] [place], [place] [address] [phone]         [facility] Follow up on 02/06/2022.   Why: You have an appointment for medication management services on 02/06/22 at 11:00 am.  This will be a Virtual telehealth appointment. Contact information: [place] [address] [phone]                 Plan Of Care/Follow-up recommendations:  Activity: as tolerated  Diet: heart healthy  Other: -Follow-up with your outpatient psychiatric provider -instructions on appointment date, time, and address (location) are provided to you in discharge paperwork.  -Take your psychiatric medications as prescribed at discharge - instructions are provided to you in the discharge paperwork  -Follow-up with outpatient primary care doctor and other specialists -for management of chronic medical disease, including: has Hypertension; Lumbar spinal stenosis; Low vitamin B12 level; OA (osteoarthritis) of knee; Enlarged uterus; Suicidal ideation; Intermittent palpitations; Excessive daytime sleepiness; Insomnia; Moderate persistent asthma without complication; Body mass index (BMI) 38.0-38.9, adult; GERD (gastroesophageal reflux disease); Spondylolisthesis at L4-L5 level; S/P lumbar fusion; History of DVT of lower extremity; Bilateral leg edema; Morbid obesity ([place]); Encounter for examination following treatment at [name]; Preoperative  examination; 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  -Testing: Follow-up with outpatient provider for abnormal lab results: thrombocytosis  -Recommend abstinence from alcohol, tobacco, and other illicit drug use at discharge.   -If your psychiatric symptoms recur, worsen, or if you have side effects to your psychiatric medications, call your outpatient psychiatric provider, 911, [phone] or go to the nearest emergency department.  -If suicidal thoughts recur, call your outpatient psychiatric provider, 911, [phone] or go to the nearest emergency department.  Camelia Phenes, MD 01/14/2022, 11:34 AM

## 2022-01-14 NOTE — Hospital Course (Signed)
Tomorrow AM - After 3 pm discharge stuff

## 2022-01-15 NOTE — Progress Notes (Signed)
Discharge note: RN met with pt and reviewed pt's discharge instructions. Pt verbalized understanding of discharge instructions and pt did not have any questions. RN reviewed and provided pt with a copy of SRA, AVS and Transition Record.Paper medication prescriptions, cab voucher, and bus payment information were given to pt. Pt denied SI/HI/AVH and voiced no concerns. Pt was appreciative of the care pt received at [facility]. Patient discharged to their ride without incident.  

## 2022-01-15 NOTE — Progress Notes (Signed)
  [facility] Adult Case Management Discharge Plan :  Will you be returning to the same living situation after discharge:  No. Patient to discharge door to door for residential substance use treatment.  At discharge, do you have transportation home?: Yes,  CSW to provide taxi voucher to bus station, [facility] has provided bus ticket to [place] where she will be picked up by facility.  Do you have the ability to pay for your medications: Yes,  [employer] Medicare   Release of information consent forms completed and in the chart;  Patient's signature needed at discharge.  Patient to Follow up at:  [facility] Follow up on 01/26/2022.   Why: You have an appointment for therapy services on 01/26/22 at 11:00 am. This will be a Virtual appointment. Contact information: [address] [place], [place] [address] [phone]         [facility] Follow up on 02/06/2022.   Why: You have an appointment for medication management services on 02/06/22 at 11:00 am.  This will be a Virtual telehealth appointment. Contact information: [place] [address] [phone]                 Next level of care provider has access to [facility] and Suicide Prevention discussed: Yes,  SPE completed with patient and  Philomena Doheny [phone].    Has patient been referred to the Quitline?: Patient refused referral Tobacco Use: High Risk (01/08/2022)   Patient History    Smoking Tobacco Use: Every Day    Smokeless Tobacco Use: Never    Passive Exposure: Never   Patient has been referred for addiction treatment: Yes patient to attend residential substance use treatment.  Social History   Substance and Sexual Activity  Drug Use Yes   Types: Cocaine   Comment: crack  last used June 2023   Social History   Substance and Sexual Activity  Alcohol Use Yes    Alcohol/week: 4.0 standard drinks of alcohol   Types: 4 Cans of beer per week   Comment: 4 beers Saturday    [place], LCSWA 01/15/2022, 8:41 AM

## 2022-02-02 ENCOUNTER — Ambulatory Visit (INDEPENDENT_AMBULATORY_CARE_PROVIDER_SITE_OTHER): Payer: Medicare HMO | Admitting: Internal Medicine

## 2022-02-02 ENCOUNTER — Other Ambulatory Visit: Payer: Self-pay

## 2022-02-02 ENCOUNTER — Encounter: Payer: Self-pay | Admitting: Internal Medicine

## 2022-02-02 VITALS — BP 120/82 | HR 93 | Ht 59.0 in | Wt 193.0 lb

## 2022-02-02 DIAGNOSIS — M48062 Spinal stenosis, lumbar region with neurogenic claudication: Secondary | ICD-10-CM | POA: Diagnosis not present

## 2022-02-02 DIAGNOSIS — I10 Essential (primary) hypertension: Secondary | ICD-10-CM | POA: Diagnosis not present

## 2022-02-02 DIAGNOSIS — Z1231 Encounter for screening mammogram for malignant neoplasm of breast: Secondary | ICD-10-CM

## 2022-02-02 DIAGNOSIS — F1494 Cocaine use, unspecified with cocaine-induced mood disorder: Secondary | ICD-10-CM

## 2022-02-02 DIAGNOSIS — Z9289 Personal history of other medical treatment: Secondary | ICD-10-CM | POA: Insufficient documentation

## 2022-02-02 DIAGNOSIS — J4540 Moderate persistent asthma, uncomplicated: Secondary | ICD-10-CM

## 2022-02-02 MED ORDER — PREGABALIN 150 MG PO CAPS: 150.0000 mg | ORAL_CAPSULE | Freq: Two times a day (BID) | ORAL | 3 refills | Status: DC

## 2022-02-02 MED ORDER — METHOCARBAMOL 500 MG PO TABS: 500.0000 mg | ORAL_TABLET | Freq: Two times a day (BID) | ORAL | 2 refills | Status: DC

## 2022-02-02 MED ORDER — AMLODIPINE BESYLATE 5 MG PO TABS: 5.0000 mg | ORAL_TABLET | Freq: Every day | ORAL | 1 refills | Status: DC

## 2022-02-02 NOTE — Assessment & Plan Note (Signed)
Has been using Dulera, refilled Albuterol as needed for dyspnea or wheezing

## 2022-02-02 NOTE — Assessment & Plan Note (Addendum)
Hospital  discharge summary reviewed today Has history of cocaine induced mood disorder, but has not been using illicit drugs now She denies SI, HI

## 2022-02-02 NOTE — Assessment & Plan Note (Signed)
BP Readings from Last 1 Encounters:  02/02/22 120/82   Well-controlled overall with Amlodipine and Lasix Discontinue clonidine as her BP is WNL even without it Counseled for compliance with the medications Advised DASH diet and moderate exercise/walking, at least 150 mins/week

## 2022-02-02 NOTE — Progress Notes (Signed)
Established Patient Office Visit  Subjective:  Patient ID: Kathryn [name], female    DOB: [birth year]  Age: 51 y.o. MRN: [id number]  CC:  Chief Complaint  Patient presents with   Flank Pain    Rt side pain since 01/19/22    HPI Kathryn [name] is a 51 y.o. female with past medical history of hypertension, asthma, GERD, insomnia, mild sleep apnea, schizoaffective disorder, chronic low back pain and knee pain and obesity who presents for f/u of his chronic medical conditions.  Right sided flank pain/low back pain: She has chronic low back pain, for which she takes Lyrica.  She has run out of it.  She also takes it for peripheral neuropathy.  She has history of lumbar spinal stenosis.  She has chronic numbness and burning pain of the LE.  Denies any recent fall or injury.  She went to ER for abdominal pain recently, and had CT abdomen, which was unremarkable for any acute pathology.  She requests refill of Robaxin for muscle spasms.  Denies any dysuria or hematuria currently.  HTN: Her BP is well controlled today.  She is taking amlodipine currently.  Her medication list shows clonidine again, which was discontinued in the past.  She has anyways run out of it.  She denies any chest pain, dyspnea or palpitations currently.  Schizoaffective disorder and history of suicidal ideation: She was recently admitted again for suicidal ideation and substance abuse related mood disorder.  She has been doing better now.  She is followed by [facility] psychiatry.  Denies any SI or HI currently.  She has moved to a friend's apartment, and states that he does not abuse drugs as well.  Past Medical History:  Diagnosis Date   Anxiety    Arthritis    Asthma    Bipolar disorder (HCC)    Chronic back pain    COPD (chronic obstructive pulmonary disease) (HCC)    Chronic bronchitis   Depression    Dyspnea    GERD (gastroesophageal reflux disease)    Hypertension    Migraine    Neuropathy    Osteoarthritis of  left knee, 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   Single subsegmental pulmonary embolism without acute cor pulmonale ([place]) 06/20/2021   Sleep apnea 04/2020   GETTING A cpap   Suicidal ideation 12/24/2018    Past Surgical History:  Procedure Laterality Date   BACK SURGERY     DILATATION AND CURETTAGE/HYSTEROSCOPY WITH MINERVA N/A 06/09/2020   Procedure: DILATATION AND CURETTAGE/HYSTEROSCOPY WITH MINERVA;  Surgeon: Florian Buff, MD;  Location: [facility];  Service: Gynecology;  Laterality: N/A;   ESOPHAGOGASTRODUODENOSCOPY (EGD) WITH PROPOFOL N/A 12/25/2018   Procedure: ESOPHAGOGASTRODUODENOSCOPY (EGD) WITH PROPOFOL;  Surgeon: Rogene Houston, MD;  Location: [facility];  Service: Endoscopy;  Laterality: N/A;   FLEXIBLE SIGMOIDOSCOPY  10/14/2021   Procedure: FLEXIBLE SIGMOIDOSCOPY;  Surgeon: Harvel Quale, MD;  Location: [facility];  Service: Gastroenterology;;   PATELLA-FEMORAL ARTHROPLASTY Left 10/08/2018   Procedure: PATELLA-FEMORAL ARTHROPLASTY;  Surgeon: Marchia Bond, MD;  Location: [facility];  Service: Orthopedics;  Laterality: Left;   TUBAL LIGATION      Family History  Problem Relation Age of Onset   Gout Paternal Grandfather    Cirrhosis Paternal Grandfather    Hypertension Paternal Grandmother    Aneurysm Paternal Grandmother    Cirrhosis Maternal Grandmother    Cirrhosis Maternal Grandfather    Cancer Father    Cirrhosis Father    Cirrhosis Mother  Breast cancer Sister    Hypertension Sister    Bronchitis Daughter    Bronchitis Daughter    Asthma Son    Bronchitis Son    Migraines Neg Hx     Social History   Socioeconomic History   Marital status: Single    Spouse name: Not on file   Number of children: 3   Years of education: 10   Highest education level: 10th grade  Occupational History   Not on file  Tobacco Use   Smoking status: Former    Packs/day: 1.00    Years: 10.00    Total pack years: 10.00    Types: Cigarettes    Quit date: 04/2020     Years since quitting: 1.7    Passive exposure: Never   Smokeless tobacco: Never   Tobacco comments:    Smokes often, especially when using crack cocaine  Vaping Use   Vaping Use: Never used  Substance and Sexual Activity   Alcohol use: Not Currently    Alcohol/week: 4.0 standard drinks of alcohol    Types: 4 Cans of beer per week    Comment: 4 beers Saturday   Drug use: Not Currently    Types: Cocaine    Comment: crack  last used June 2023   Sexual activity: Yes    Birth control/protection: Surgical    Comment: tubal, ablation  Other Topics Concern   Not on file  Social History Narrative   R handed    Lives with boyfriend   1 Cup of caffeine daily    Social Determinants of Health   Financial Resource Strain: Not on file  Food Insecurity: Food Insecurity Present (01/08/2022)   Hunger Vital Sign    Worried About Running Out of Food in the Last Year: Sometimes true    Ran Out of Food in the Last Year: Sometimes true  Transportation Needs: No Transportation Needs (01/08/2022)   PRAPARE - Hydrologist (Medical): No    Lack of Transportation (Non-Medical): No  Physical Activity: Not on file  Stress: Not on file  Social Connections: Not on file  Intimate Partner Violence: Not At Risk (01/08/2022)   Humiliation, Afraid, Rape, and Kick questionnaire    Fear of Current or Ex-Partner: No    Emotionally Abused: No    Physically Abused: No    Sexually Abused: No    Outpatient Medications Prior to Visit  Medication Sig Dispense Refill   albuterol (VENTOLIN HFA) 108 (90 Base) MCG/ACT inhaler Inhale into the lungs.     anagrelide (AGRYLIN) 1 MG capsule Take 1 capsule (1 mg total) by mouth 2 (two) times daily. 60 capsule 0   DULoxetine (CYMBALTA) 60 MG capsule Take 1 capsule (60 mg total) by mouth daily. 30 capsule 0   furosemide (LASIX) 20 MG tablet Take 1 tablet (20 mg total) by mouth daily. 30 tablet 10   lubiprostone (AMITIZA) 24 MCG capsule Take 1  capsule (24 mcg total) by mouth 2 (two) times daily with a meal. 60 capsule 0   lurasidone (LATUDA) 40 MG TABS tablet Take 1 tablet (40 mg total) by mouth daily with breakfast. 30 tablet 0   melatonin 5 MG TABS Take 1 tablet (5 mg total) by mouth at bedtime as needed. 30 tablet 0   nicotine (NICODERM CQ - DOSED IN MG/24 HOURS) 21 mg/24hr patch Place 1 patch (21 mg total) onto the skin daily. 30 patch 0   omeprazole (PRILOSEC) 20 MG  capsule Take 20 mg by mouth daily.     pantoprazole (PROTONIX) 40 MG tablet Take 1 tablet (40 mg total) by mouth daily. 30 tablet 0   potassium chloride SA (KLOR-CON M) 20 MEQ tablet Take 1 tablet (20 mEq total) by mouth daily. TAKE (1) TABLET BY MOUTH ONCE A DAY. Strength: 20 mEq 30 tablet 0   amLODipine (NORVASC) 5 MG tablet Take 1 tablet (5 mg total) by mouth daily. 30 tablet 0   cloNIDine (CATAPRES) 0.1 MG tablet Take 0.1 mg by mouth daily.     methocarbamol (ROBAXIN) 500 MG tablet Take 500 mg by mouth 2 (two) times daily.     mometasone-formoterol (DULERA) 200-5 MCG/ACT AERO Inhale 2 puffs into the lungs 2 (two) times daily. 1 each 0   pregabalin (LYRICA) 200 MG capsule Take 200 mg by mouth 2 (two) times daily.     mometasone-formoterol (DULERA) 100-5 MCG/ACT AERO Inhale 2 puffs into the lungs 2 (two) times daily. (Patient not taking: Reported on 02/02/2022) 1 each 0   traZODone (DESYREL) 100 MG tablet Take 100 mg by mouth at bedtime. (Patient not taking: Reported on 02/02/2022)     No facility-administered medications prior to visit.    Allergies  Allergen Reactions   Fish Allergy Anaphylaxis, Shortness Of Breath and Swelling   Flexeril [Cyclobenzaprine Hcl] Shortness Of Breath   Ibuprofen Anaphylaxis, Hives and Other (See Comments)   Shellfish Allergy Anaphylaxis   Tylenol [Acetaminophen] Anaphylaxis   Ace Inhibitors Cough   Peanut Allergen Powder-Dnfp    Tramadol Nausea And Vomiting    Upset stomach   Trazodone And Nefazodone Hives    ROS Review of  Systems  Constitutional:  Negative for chills and fever.  HENT:  Negative for congestion, sinus pressure, sinus pain and sore throat.   Eyes:  Negative for pain and discharge.  Respiratory:  Negative for cough and shortness of breath.   Cardiovascular:  Negative for chest pain and palpitations.  Gastrointestinal:  Negative for abdominal pain, constipation, diarrhea, nausea and vomiting.  Endocrine: Negative for polydipsia and polyuria.  Genitourinary:  Negative for dysuria, hematuria, menstrual problem, vaginal bleeding and vaginal discharge.  Musculoskeletal:  Positive for arthralgias and back pain. Negative for neck pain and neck stiffness.  Skin:  Negative for rash.  Neurological:  Negative for dizziness and weakness.  Psychiatric/Behavioral:  Positive for agitation, behavioral problems, dysphoric mood and sleep disturbance.       Objective:    Physical Exam Vitals reviewed.  Constitutional:      General: She is not in acute distress.    Appearance: She is obese. She is not diaphoretic.  HENT:     Head: Normocephalic and atraumatic.     Nose: Nose normal.     Mouth/Throat:     Mouth: Mucous membranes are moist.  Eyes:     General: No scleral icterus.    Extraocular Movements: Extraocular movements intact.  Cardiovascular:     Rate and Rhythm: Normal rate and regular rhythm.     Pulses: Normal pulses.     Heart sounds: Normal heart sounds. No murmur heard. Pulmonary:     Breath sounds: Normal breath sounds. No wheezing or rales.  Musculoskeletal:        General: Tenderness (Lumbar spine area) present.     Cervical back: Neck supple. No tenderness.     Right lower leg: No edema.     Left lower leg: No edema.  Skin:    General: Skin is warm.  Findings: No rash.  Neurological:     General: No focal deficit present.     Mental Status: She is alert and oriented to person, place, and time.     Sensory: Sensory deficit (B/l LE) present.     Motor: Weakness (B/l LE  (chronic) - 4/5) present.  Psychiatric:        Mood and Affect: Mood normal.        Behavior: Behavior normal.     BP 120/82 (BP Location: Right Arm, Patient Position: Sitting, Cuff Size: Large)   Pulse 93   Ht '4\' 11"'$  (1.499 m)   Wt 193 lb (87.5 kg)   LMP 06/07/2020   SpO2 96%   BMI 38.98 kg/m  Wt Readings from Last 3 Encounters:  02/02/22 193 lb (87.5 kg)  01/08/22 188 lb (85.3 kg)  01/06/22 165 lb (74.8 kg)    Lab Results  Component Value Date   TSH 0.812 01/10/2022   Lab Results  Component Value Date   WBC 6.6 01/10/2022   HGB 12.1 01/10/2022   HCT 37.8 01/10/2022   MCV 86.9 01/10/2022   PLT 637 (H) 01/10/2022   Lab Results  Component Value Date   NA 135 01/10/2022   K 4.2 01/10/2022   CO2 23 01/10/2022   GLUCOSE 90 01/10/2022   BUN 11 01/10/2022   CREATININE 0.75 01/10/2022   BILITOT 0.6 01/06/2022   ALKPHOS 74 01/06/2022   AST 18 01/06/2022   ALT 19 01/06/2022   PROT 7.2 01/06/2022   ALBUMIN 3.5 01/06/2022   CALCIUM 9.3 01/10/2022   ANIONGAP 8 01/10/2022   Lab Results  Component Value Date   CHOL 168 01/10/2022   Lab Results  Component Value Date   HDL 57 01/10/2022   Lab Results  Component Value Date   LDLCALC 100 (H) 01/10/2022   Lab Results  Component Value Date   TRIG 56 01/10/2022   Lab Results  Component Value Date   CHOLHDL 2.9 01/10/2022   Lab Results  Component Value Date   HGBA1C 5.5 01/10/2022      Assessment & Plan:   Problem List Items Addressed This Visit       Cardiovascular and Mediastinum   Hypertension - Primary    BP Readings from Last 1 Encounters:  02/02/22 120/82  Well-controlled overall with Amlodipine and Lasix Discontinue clonidine as her BP is WNL even without it Counseled for compliance with the medications Advised DASH diet and moderate exercise/walking, at least 150 mins/week      Relevant Medications   amLODipine (NORVASC) 5 MG tablet     Respiratory   Moderate persistent asthma without  complication    Has been using Dulera, refilled Albuterol as needed for dyspnea or wheezing        Nervous and Auditory   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[place])    Followed by [facility] Followed by [facility] recovery        Other   Lumbar spinal stenosis    Her back pain is likely due to lumbar spinal stenosis On Lyrica, refilled Refilled Robaxin as needed for muscle spasms      Relevant Medications   methocarbamol (ROBAXIN) 500 MG tablet   pregabalin (LYRICA) 150 MG capsule   Hospitalization within last 30 days    Hospital  discharge summary reviewed today Has history of cocaine induced mood disorder, but has not been using illicit drugs now She denies SI, HI  Meds ordered this encounter  Medications   methocarbamol (ROBAXIN) 500 MG tablet    Sig: Take 1 tablet (500 mg total) by mouth 2 (two) times daily.    Dispense:  60 tablet    Refill:  2   pregabalin (LYRICA) 150 MG capsule    Sig: Take 1 capsule (150 mg total) by mouth 2 (two) times daily.    Dispense:  60 capsule    Refill:  3   amLODipine (NORVASC) 5 MG tablet    Sig: Take 1 tablet (5 mg total) by mouth daily.    Dispense:  90 tablet    Refill:  1    Follow-up: Return in about 3 months (around 05/05/2022) for Annual physical.    Lindell Spar, MD

## 2022-02-02 NOTE — Assessment & Plan Note (Signed)
Followed by [facility] program Followed by [facility] recovery

## 2022-02-02 NOTE — Assessment & Plan Note (Addendum)
Her back pain is likely due to lumbar spinal stenosis On Lyrica, refilled Refilled Robaxin as needed for muscle spasms

## 2022-02-02 NOTE — Patient Instructions (Signed)
Please continue to take medications as prescribed.  Please bring your home medications in the next visit.  Please follow low salt diet and ambulate as tolerated.

## 2022-02-17 ENCOUNTER — Ambulatory Visit: Payer: Medicare HMO | Admitting: Nurse Practitioner

## 2022-02-22 ENCOUNTER — Ambulatory Visit (HOSPITAL_COMMUNITY): Payer: Medicare HMO

## 2022-02-23 ENCOUNTER — Ambulatory Visit (HOSPITAL_COMMUNITY): Payer: Medicare HMO

## 2022-02-24 ENCOUNTER — Ambulatory Visit: Payer: Medicare HMO | Admitting: Nurse Practitioner

## 2022-03-04 ENCOUNTER — Other Ambulatory Visit: Payer: Self-pay | Admitting: Internal Medicine

## 2022-03-06 ENCOUNTER — Other Ambulatory Visit: Payer: Self-pay

## 2022-03-06 DIAGNOSIS — M48062 Spinal stenosis, lumbar region with neurogenic claudication: Secondary | ICD-10-CM

## 2022-03-06 MED ORDER — PREGABALIN 150 MG PO CAPS: 150.0000 mg | ORAL_CAPSULE | Freq: Two times a day (BID) | ORAL | 3 refills | Status: DC

## 2022-03-06 NOTE — Addendum Note (Signed)
Addended by: Johny Drilling on: 03/06/2022 10:41 AM   Modules accepted: Orders

## 2022-03-08 ENCOUNTER — Telehealth: Payer: Self-pay | Admitting: Internal Medicine

## 2022-03-08 DIAGNOSIS — M48062 Spinal stenosis, lumbar region with neurogenic claudication: Secondary | ICD-10-CM

## 2022-03-08 MED ORDER — PREGABALIN 150 MG PO CAPS: 150.0000 mg | ORAL_CAPSULE | Freq: Two times a day (BID) | ORAL | 3 refills | Status: DC

## 2022-03-08 NOTE — Telephone Encounter (Signed)
Patient needs refill on    pregabalin (LYRICA) 150 MG capsule     [facility] - [address], [address] [place], [place] [address] Phone: [phone]  Fax: [phone]

## 2022-03-13 ENCOUNTER — Emergency Department (HOSPITAL_COMMUNITY)
Admission: EM | Admit: 2022-03-13 | Discharge: 2022-03-13 | Discharge status: Against Medical Advice | Payer: Medicare HMO | Source: Home / Self Care

## 2022-03-28 ENCOUNTER — Other Ambulatory Visit: Payer: Self-pay | Admitting: Nurse Practitioner

## 2022-03-28 DIAGNOSIS — R051 Acute cough: Secondary | ICD-10-CM

## 2022-04-03 ENCOUNTER — Encounter: Payer: Self-pay | Admitting: Internal Medicine

## 2022-04-03 ENCOUNTER — Encounter: Payer: 59 | Admitting: Internal Medicine

## 2022-04-12 ENCOUNTER — Other Ambulatory Visit: Payer: Self-pay | Admitting: Nurse Practitioner

## 2022-04-12 DIAGNOSIS — R051 Acute cough: Secondary | ICD-10-CM

## 2022-04-13 ENCOUNTER — Other Ambulatory Visit: Payer: Self-pay | Admitting: Internal Medicine

## 2022-04-13 ENCOUNTER — Telehealth: Payer: Self-pay | Admitting: Internal Medicine

## 2022-04-13 NOTE — Progress Notes (Incomplete)
Patient Care Team: Lindell Spar, MD as PCP - General (Internal Medicine) Viviana Simpler (Physician Assistant)  DIAGNOSIS: No diagnosis found.  SUMMARY OF ONCOLOGIC HISTORY: Oncology History   No history exists.    CHIEF COMPLIANT: Follow-up of elevated platelets count   INTERVAL HISTORY: Kathryn [name] is a 51 y.o. female is here because of history of blood clots and thrombocytosis she presents to the clinic today for a follow-up.    ALLERGIES:  is allergic to fish allergy, flexeril [cyclobenzaprine hcl], ibuprofen, shellfish allergy, tylenol [acetaminophen], ace inhibitors, peanut allergen powder-dnfp, tramadol, and trazodone and nefazodone.  MEDICATIONS:  Current Outpatient Medications  Medication Sig Dispense Refill   albuterol (VENTOLIN HFA) 108 (90 Base) MCG/ACT inhaler Inhale into the lungs.     amLODipine (NORVASC) 5 MG tablet Take 1 tablet (5 mg total) by mouth daily. 90 tablet 1   anagrelide (AGRYLIN) 1 MG capsule Take 1 capsule (1 mg total) by mouth 2 (two) times daily. 60 capsule 0   BREO ELLIPTA 200-25 MCG/ACT AEPB INHALE 1 PUFF EVERY DAY 180 each 10   DULoxetine (CYMBALTA) 60 MG capsule Take 1 capsule (60 mg total) by mouth daily. 30 capsule 0   furosemide (LASIX) 20 MG tablet Take 1 tablet (20 mg total) by mouth daily. 30 tablet 10   lubiprostone (AMITIZA) 24 MCG capsule Take 1 capsule (24 mcg total) by mouth 2 (two) times daily with a meal. 60 capsule 0   lurasidone (LATUDA) 40 MG TABS tablet Take 1 tablet (40 mg total) by mouth daily with breakfast. 30 tablet 0   methocarbamol (ROBAXIN) 500 MG tablet Take 1 tablet (500 mg total) by mouth 2 (two) times daily. 60 tablet 2   mometasone-formoterol (DULERA) 100-5 MCG/ACT AERO Inhale 2 puffs into the lungs 2 (two) times daily. (Patient not taking: Reported on 02/02/2022) 1 each 0   omeprazole (PRILOSEC) 20 MG capsule Take 20 mg by mouth daily.     pantoprazole (PROTONIX) 40 MG tablet Take 1 tablet (40 mg  total) by mouth daily. 30 tablet 0   potassium chloride SA (KLOR-CON M) 20 MEQ tablet Take 1 tablet (20 mEq total) by mouth daily. TAKE (1) TABLET BY MOUTH ONCE A DAY. Strength: 20 mEq 30 tablet 0   pregabalin (LYRICA) 150 MG capsule Take 1 capsule (150 mg total) by mouth 2 (two) times daily. 60 capsule 3   traZODone (DESYREL) 100 MG tablet Take 100 mg by mouth at bedtime. (Patient not taking: Reported on 02/02/2022)     Vitamin D, Ergocalciferol, (DRISDOL) 1.25 MG (50000 UNIT) CAPS capsule TAKE 1 CAPSULE THE SAME DAY EACH WEEK. 12 capsule 1   No current facility-administered medications for this visit.    PHYSICAL EXAMINATION: ECOG PERFORMANCE STATUS: {CHL ONC ECOG [id number]}  There were no vitals filed for this visit. There were no vitals filed for this visit.  BREAST:*** No palpable masses or nodules in either right or left breasts. No palpable axillary supraclavicular or infraclavicular adenopathy no breast tenderness or nipple discharge. (exam performed in the presence of a chaperone)  LABORATORY DATA:  I have reviewed the data as listed    Latest Ref Rng & Units 01/10/2022    6:37 AM 01/06/2022    2:52 PM 10/21/2021    6:37 AM  CMP  Glucose 70 - 99 mg/dL 90  83  86   BUN 6 - 20 mg/dL '11  7  11   '$ Creatinine 0.44 - 1.00 mg/dL 0.75  0.67  0.75   Sodium 135 - 145 mmol/L 135  137  139   Potassium 3.5 - 5.1 mmol/L 4.2  3.4  3.9   Chloride 98 - 111 mmol/L 104  106  108   CO2 22 - 32 mmol/L '23  21  23   '$ Calcium 8.9 - 10.3 mg/dL 9.3  8.8  9.1   Total Protein 6.5 - 8.1 g/dL  7.2    Total Bilirubin 0.3 - 1.2 mg/dL  0.6    Alkaline Phos 38 - 126 U/L  74    AST 15 - 41 U/L  18    ALT 0 - 44 U/L  19      Lab Results  Component Value Date   WBC 6.6 01/10/2022   HGB 12.1 01/10/2022   HCT 37.8 01/10/2022   MCV 86.9 01/10/2022   PLT 637 (H) 01/10/2022   NEUTROABS 4.3 01/10/2022    ASSESSMENT & PLAN:  No problem-specific Assessment & Plan notes found for this  encounter.    No orders of the defined types were placed in this encounter.  The patient has a good understanding of the overall plan. she agrees with it. she will call with any problems that may develop before the next visit here. Total time spent: 30 mins including face to face time and time spent for planning, charting and co-ordination of care   Suzzette Righter, [place] 04/13/22    I Gardiner Coins am acting as a Education administrator for [employer]  ***

## 2022-04-13 NOTE — Telephone Encounter (Signed)
Letter printed and up front

## 2022-04-13 NOTE — Telephone Encounter (Signed)
Pt called stating that she is needing a letter stating that she needs power in order to run her breathing machine. She is needing this in case her power goes out so that they turn it back on first. Can you please write this letter?

## 2022-04-18 ENCOUNTER — Inpatient Hospital Stay: Payer: Medicare HMO | Admitting: Hematology and Oncology

## 2022-04-18 ENCOUNTER — Inpatient Hospital Stay: Payer: Medicare HMO | Attending: Hematology and Oncology

## 2022-04-18 NOTE — Assessment & Plan Note (Deleted)
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 Aspirin: Is not necessary because CALR is not associated with a marked increase in thrombosis. 2. Platelet lowering therapy: Anagrelide   RTC in 3 months

## 2022-04-20 ENCOUNTER — Telehealth: Payer: Self-pay | Admitting: Internal Medicine

## 2022-04-20 ENCOUNTER — Encounter: Payer: Medicare HMO | Admitting: Internal Medicine

## 2022-04-20 NOTE — Telephone Encounter (Signed)
Good morning, pt has missed 8 appts (1.9.23, 3.8.23, 3.28.23,4.20.23,5.4.23,5.8.23,12.18.23, & 1.4.24). A letter was sent at her last no show as she had only missed 1 appt (not sure what happened). How would you like to proceed?

## 2022-04-21 NOTE — Telephone Encounter (Signed)
Tried to call patien x2 "call can't be connected at this time"

## 2022-04-27 ENCOUNTER — Other Ambulatory Visit: Payer: Self-pay

## 2022-04-27 ENCOUNTER — Emergency Department (HOSPITAL_COMMUNITY): Payer: 59

## 2022-04-27 ENCOUNTER — Encounter (HOSPITAL_COMMUNITY): Payer: Self-pay | Admitting: *Deleted

## 2022-04-27 ENCOUNTER — Emergency Department (HOSPITAL_COMMUNITY)
Admission: EM | Admit: 2022-04-27 | Discharge: 2022-04-27 | Disposition: A | Discharge status: Home / Self Care | Payer: 59 | Attending: Student | Admitting: Student

## 2022-04-27 DIAGNOSIS — R059 Cough, unspecified: Secondary | ICD-10-CM | POA: Diagnosis not present

## 2022-04-27 DIAGNOSIS — I10 Essential (primary) hypertension: Secondary | ICD-10-CM | POA: Diagnosis not present

## 2022-04-27 DIAGNOSIS — Z79899 Other long term (current) drug therapy: Secondary | ICD-10-CM | POA: Diagnosis not present

## 2022-04-27 DIAGNOSIS — R600 Localized edema: Secondary | ICD-10-CM | POA: Diagnosis not present

## 2022-04-27 DIAGNOSIS — R0602 Shortness of breath: Secondary | ICD-10-CM | POA: Diagnosis not present

## 2022-04-27 DIAGNOSIS — R3915 Urgency of urination: Secondary | ICD-10-CM | POA: Insufficient documentation

## 2022-04-27 DIAGNOSIS — R300 Dysuria: Secondary | ICD-10-CM | POA: Insufficient documentation

## 2022-04-27 DIAGNOSIS — J441 Chronic obstructive pulmonary disease with (acute) exacerbation: Secondary | ICD-10-CM | POA: Diagnosis not present

## 2022-04-27 DIAGNOSIS — Z1152 Encounter for screening for COVID-19: Secondary | ICD-10-CM | POA: Insufficient documentation

## 2022-04-27 DIAGNOSIS — R531 Weakness: Secondary | ICD-10-CM | POA: Diagnosis not present

## 2022-04-27 DIAGNOSIS — R609 Edema, unspecified: Secondary | ICD-10-CM

## 2022-04-27 LAB — TROPONIN I (HIGH SENSITIVITY)
Troponin I (High Sensitivity): 3 ng/L (ref <18)
Troponin I (High Sensitivity): 3 ng/L (ref <18)

## 2022-04-27 LAB — COMPREHENSIVE METABOLIC PANEL
ALT: 16 U/L (ref 0–44)
AST: 18 U/L (ref 15–41)
Albumin: 3.4 g/dL — ABNORMAL LOW (ref 3.5–5.0)
Alkaline Phosphatase: 67 U/L (ref 38–126)
Anion gap: 7 (ref 5–15)
BUN: 20 mg/dL (ref 6–20)
CO2: 24 mmol/L (ref 22–32)
Calcium: 8.5 mg/dL — ABNORMAL LOW (ref 8.9–10.3)
Chloride: 109 mmol/L (ref 98–111)
Creatinine, Ser: 0.75 mg/dL (ref 0.44–1.00)
GFR, Estimated: >60 mL/min (ref >60)
Glucose, Bld: 79 mg/dL (ref 70–99)
Potassium: 3.6 mmol/L (ref 3.5–5.1)
Sodium: 140 mmol/L (ref 135–145)
Total Bilirubin: 0.3 mg/dL (ref 0.3–1.2)
Total Protein: 6.8 g/dL (ref 6.5–8.1)

## 2022-04-27 LAB — URINALYSIS, ROUTINE W REFLEX MICROSCOPIC
Bilirubin Urine: NEGATIVE
Glucose, UA: NEGATIVE mg/dL
Hgb urine dipstick: NEGATIVE
Ketones, ur: NEGATIVE mg/dL
Leukocytes,Ua: NEGATIVE
Nitrite: NEGATIVE
Protein, ur: NEGATIVE mg/dL
Specific Gravity, Urine: 1.015 (ref 1.005–1.030)
pH: 8 (ref 5.0–8.0)

## 2022-04-27 LAB — CBC WITH DIFFERENTIAL/PLATELET
Abs Immature Granulocytes: 0.02 10*3/uL (ref 0.00–0.07)
Basophils Absolute: 0 10*3/uL (ref 0.0–0.1)
Basophils Relative: 1 %
Eosinophils Absolute: 0.2 10*3/uL (ref 0.0–0.5)
Eosinophils Relative: 3 %
HCT: 34 % — ABNORMAL LOW (ref 36.0–46.0)
Hemoglobin: 10.7 g/dL — ABNORMAL LOW (ref 12.0–15.0)
Immature Granulocytes: 0 %
Lymphocytes Relative: 40 %
Lymphs Abs: 2.9 10*3/uL (ref 0.7–4.0)
MCH: 26.9 pg (ref 26.0–34.0)
MCHC: 31.5 g/dL (ref 30.0–36.0)
MCV: 85.4 fL (ref 80.0–100.0)
Monocytes Absolute: 0.5 10*3/uL (ref 0.1–1.0)
Monocytes Relative: 6 %
Neutro Abs: 3.6 10*3/uL (ref 1.7–7.7)
Neutrophils Relative %: 50 %
Platelets: 595 10*3/uL — ABNORMAL HIGH (ref 150–400)
RBC: 3.98 MIL/uL (ref 3.87–5.11)
RDW: 16.6 % — ABNORMAL HIGH (ref 11.5–15.5)
WBC: 7.3 10*3/uL (ref 4.0–10.5)
nRBC: 0 % (ref 0.0–0.2)

## 2022-04-27 LAB — RESP PANEL BY RT-PCR (RSV, FLU A&B, COVID)  RVPGX2
Influenza A by PCR: NEGATIVE
Influenza B by PCR: NEGATIVE
Resp Syncytial Virus by PCR: NEGATIVE
SARS Coronavirus 2 by RT PCR: NEGATIVE

## 2022-04-27 LAB — BRAIN NATRIURETIC PEPTIDE: B Natriuretic Peptide: 195 pg/mL — ABNORMAL HIGH (ref 0.0–100.0)

## 2022-04-27 MED ORDER — MORPHINE SULFATE (PF) 2 MG/ML IV SOLN
2.0000 mg | Freq: Once | INTRAVENOUS | Status: AC
  Administered 2022-04-27: 2 mg via INTRAVENOUS
  Filled 2022-04-27: qty 1

## 2022-04-27 MED ORDER — DEXAMETHASONE SODIUM PHOSPHATE 10 MG/ML IJ SOLN
10.0000 mg | Freq: Once | INTRAMUSCULAR | Status: AC
  Administered 2022-04-27: 10 mg via INTRAVENOUS
  Filled 2022-04-27: qty 1

## 2022-04-27 MED ORDER — IPRATROPIUM-ALBUTEROL 0.5-2.5 (3) MG/3ML IN SOLN
3.0000 mL | Freq: Once | RESPIRATORY_TRACT | Status: AC
  Filled 2022-04-27: qty 3

## 2022-04-27 MED ORDER — IPRATROPIUM-ALBUTEROL 0.5-2.5 (3) MG/3ML IN SOLN
RESPIRATORY_TRACT | Status: AC
  Administered 2022-04-27: 3 mL via RESPIRATORY_TRACT
  Filled 2022-04-27: qty 3

## 2022-04-27 MED ORDER — PREDNISONE 10 MG PO TABS: ORAL_TABLET | ORAL | 0 refills | Status: AC

## 2022-04-27 MED ORDER — FUROSEMIDE 20 MG PO TABS: 20.0000 mg | ORAL_TABLET | Freq: Every day | ORAL | 10 refills | Status: DC

## 2022-04-27 MED ORDER — IPRATROPIUM-ALBUTEROL 0.5-2.5 (3) MG/3ML IN SOLN
3.0000 mL | Freq: Once | RESPIRATORY_TRACT | Status: AC
  Administered 2022-04-27: 3 mL via RESPIRATORY_TRACT
  Filled 2022-04-27: qty 3

## 2022-04-27 NOTE — ED Provider Notes (Signed)
[facility] EMERGENCY DEPARTMENT Provider Note   CSN: [id number] Arrival date & time: 04/27/22  4098     History  Chief Complaint  Patient presents with   Leg Swelling   Flank Pain    Kathryn [name] is a 52 y.o. female.  With past medical history of hypertension, GERD, migraine, COPD, bipolar, asthma, PE who presents to the emergency department with dysuria and leg swelling.  States that beginning yesterday she has had dysuria and foul smelling urine. She states she is having some mild suprapubic tenderness as well as bilateral flank pain. She states that she is having urgency and frequency. Denies fevers.   Additionally, she is complaining of lower extremity swelling. States that both her legs have been swollen since Monday. She has a history of congestive heart failure and states she has not been on her lasix in about 2 weeks because she ran out and could not afford the refill. States that she has also been more short of breath over the past 1 month. Also complains of orthopnea. Has been having cough without fever. She denies chest pain or palpitations.    Flank Pain Associated symptoms include shortness of breath.       Home Medications Prior to Admission medications   Medication Sig Start Date End Date Taking? Authorizing Provider  albuterol (VENTOLIN HFA) 108 (90 Base) MCG/ACT inhaler Inhale 1-2 puffs into the lungs every 4 (four) hours as needed for wheezing or shortness of breath. 12/22/21  Yes [provider]  amLODipine (NORVASC) 5 MG tablet Take 1 tablet (5 mg total) by mouth daily. 02/02/22  Yes Lindell Spar, MD  anagrelide (AGRYLIN) 1 MG capsule Take 1 capsule (1 mg total) by mouth 2 (two) times daily. 01/14/22  Yes Larita Fife, MD  BREO ELLIPTA 200-25 MCG/ACT AEPB INHALE 1 PUFF EVERY DAY 03/06/22  Yes Lindell Spar, MD  cholecalciferol (VITAMIN D3) 25 MCG (1000 UNIT) tablet Take 1,000 Units by mouth daily.   Yes [provider]  citalopram  (CELEXA) 20 MG tablet Take 20 mg by mouth daily. 04/12/22  Yes [provider]  hydrOXYzine (VISTARIL) 50 MG capsule Take 100 mg by mouth 2 (two) times daily. 04/12/22  Yes [provider]  lurasidone (LATUDA) 40 MG TABS tablet Take 1 tablet (40 mg total) by mouth daily with breakfast. 01/15/22  Yes Paliy, Delrae Rend, MD  omeprazole (PRILOSEC) 20 MG capsule Take 20 mg by mouth daily. 12/22/21  Yes [provider]  pantoprazole (PROTONIX) 40 MG tablet Take 1 tablet (40 mg total) by mouth daily. 01/14/22  Yes Paliy, Delrae Rend, MD  potassium chloride SA (KLOR-CON M) 20 MEQ tablet Take 1 tablet (20 mEq total) by mouth daily. TAKE (1) TABLET BY MOUTH ONCE A DAY. Strength: 20 mEq 12/16/21  Yes Patel, Colin Broach, MD  predniSONE (DELTASONE) 10 MG tablet Take 6 tablets (60 mg total) by mouth daily for 1 day, THEN 4 tablets (40 mg total) daily for 5 days. 04/27/22 05/03/22 Yes Mickie Hillier, PA-C  pregabalin (LYRICA) 150 MG capsule Take 1 capsule (150 mg total) by mouth 2 (two) times daily. 03/08/22  Yes Johnette Abraham, MD  risperiDONE (RISPERDAL) 0.5 MG tablet Take 0.5 mg by mouth 2 (two) times daily. 04/12/22  Yes [provider]  traZODone (DESYREL) 100 MG tablet Take 100 mg by mouth at bedtime. 11/17/21  Yes [provider]  DULoxetine (CYMBALTA) 60 MG capsule Take 1 capsule (60 mg total) by mouth daily. 01/15/22  02/14/22  Larita Fife, MD  furosemide (LASIX) 20 MG tablet Take 1 tablet (20 mg total) by mouth daily. 04/27/22   Mickie Hillier, PA-C  lubiprostone (AMITIZA) 24 MCG capsule Take 1 capsule (24 mcg total) by mouth 2 (two) times daily with a meal. Patient not taking: Reported on 04/27/2022 10/22/21   Nicholes Rough, NP  methocarbamol (ROBAXIN) 500 MG tablet Take 1 tablet (500 mg total) by mouth 2 (two) times daily. Patient not taking: Reported on 04/27/2022 02/02/22   Lindell Spar, MD  mometasone-formoterol [employer]) 100-5 MCG/ACT AERO Inhale 2 puffs into the lungs 2 (two)  times daily. Patient not taking: Reported on 02/02/2022 01/14/22   Larita Fife, MD  Vitamin D, Ergocalciferol, (DRISDOL) 1.25 MG (50000 UNIT) CAPS capsule TAKE 1 CAPSULE THE SAME DAY EACH WEEK. Patient not taking: Reported on 04/27/2022 04/13/22   Lindell Spar, MD      Allergies    Fish allergy, Flexeril [cyclobenzaprine hcl], Ibuprofen, Shellfish allergy, Tylenol [acetaminophen], Ace inhibitors, Peanut allergen powder-dnfp, Tramadol, and Trazodone and nefazodone    Review of Systems   Review of Systems  Respiratory:  Positive for cough and shortness of breath.   Cardiovascular:  Positive for leg swelling.  Gastrointestinal:  Positive for nausea.  Genitourinary:  Positive for dysuria, flank pain, frequency and urgency.  All other systems reviewed and are negative.   Physical Exam Updated Vital Signs BP 115/68 (BP Location: Left Arm)   Pulse 88   Temp 98.3 F (36.8 C) (Oral)   Resp 18   Ht '4\' 11"'$  (1.499 m)   Wt 61.2 kg   LMP 06/07/2020   SpO2 96%   BMI 27.26 kg/m  Physical Exam Vitals and nursing note reviewed.  Constitutional:      General: She is not in acute distress.    Appearance: Normal appearance. She is obese. She is not ill-appearing or toxic-appearing.  HENT:     Head: Normocephalic.  Eyes:     General: No scleral icterus.    Extraocular Movements: Extraocular movements intact.  Cardiovascular:     Rate and Rhythm: Normal rate and regular rhythm.     Pulses: Normal pulses.          Radial pulses are 2+ on the right side and 2+ on the left side.       Dorsalis pedis pulses are 2+ on the right side and 2+ on the left side.     Heart sounds: No murmur heard.    Comments: Trace bilateral lower extremity swelling. Tenderness bilaterally  Pulmonary:     Effort: Pulmonary effort is normal.     Breath sounds: Wheezing present.     Comments: Bilateral, diffuse wheezing without tachypnea or hypoxia  Abdominal:     General: Bowel sounds are normal.      Palpations: Abdomen is soft.     Tenderness: There is no abdominal tenderness.  Musculoskeletal:     Cervical back: Neck supple.     Right lower leg: Edema present.     Left lower leg: Edema present.  Skin:    General: Skin is warm and dry.     Capillary Refill: Capillary refill takes less than 2 seconds.  Neurological:     General: No focal deficit present.     Mental Status: She is alert and oriented to person, place, and time. Mental status is at baseline.  Psychiatric:        Mood and Affect: Mood normal.  Behavior: Behavior normal.        Thought Content: Thought content normal.        Judgment: Judgment normal.     ED Results / Procedures / Treatments   Labs (all labs ordered are listed, but only abnormal results are displayed) Labs Reviewed  COMPREHENSIVE METABOLIC PANEL - Abnormal; Notable for the following components:      Result Value   Calcium 8.5 (*)    Albumin 3.4 (*)    All other components within normal limits  CBC WITH DIFFERENTIAL/PLATELET - Abnormal; Notable for the following components:   Hemoglobin 10.7 (*)    HCT 34.0 (*)    RDW 16.6 (*)    Platelets 595 (*)    All other components within normal limits  URINALYSIS, ROUTINE W REFLEX MICROSCOPIC - Abnormal; Notable for the following components:   Color, Urine STRAW (*)    All other components within normal limits  BRAIN NATRIURETIC PEPTIDE - Abnormal; Notable for the following components:   B Natriuretic Peptide 195.0 (*)    All other components within normal limits  RESP PANEL BY RT-PCR (RSV, FLU A&B, COVID)  [id number]  URINE CULTURE  TROPONIN I (HIGH SENSITIVITY)  TROPONIN I (HIGH SENSITIVITY)    EKG None  Radiology US Venous Img Lower Bilateral (DVT)  Result Date: 04/27/2022 CLINICAL DATA:  [id number] Bilateral leg edema [id number] EXAM: BILATERAL LOWER EXTREMITY VENOUS DOPPLER ULTRASOUND TECHNIQUE: Gray-scale sonography with graded compression, as well as color Doppler and duplex ultrasound were  performed to evaluate the lower extremity deep venous systems from the level of the common femoral vein and including the common femoral, femoral, profunda femoral, popliteal and calf veins including the posterior tibial, peroneal and gastrocnemius veins when visible. The superficial great saphenous vein was also interrogated. Spectral Doppler was utilized to evaluate flow at rest and with distal augmentation maneuvers in the common femoral, femoral and popliteal veins. COMPARISON:  None Available. FINDINGS: RIGHT LOWER EXTREMITY Common Femoral Vein: No evidence of thrombus. Normal compressibility, respiratory phasicity and response to augmentation. Saphenofemoral Junction: No evidence of thrombus. Normal compressibility and flow on color Doppler imaging. Profunda Femoral Vein: No evidence of thrombus. Normal compressibility and flow on color Doppler imaging. Femoral Vein: No evidence of thrombus. Normal compressibility, respiratory phasicity and response to augmentation. Popliteal Vein: No evidence of thrombus. Normal compressibility, respiratory phasicity and response to augmentation. Calf Veins: No evidence of thrombus. Normal compressibility and flow on color Doppler imaging. Superficial Great Saphenous Vein: No evidence of thrombus. Normal compressibility. Venous Reflux:  None. Other Findings:  None. LEFT LOWER EXTREMITY Common Femoral Vein: No evidence of thrombus. Normal compressibility, respiratory phasicity and response to augmentation. Saphenofemoral Junction: No evidence of thrombus. Normal compressibility and flow on color Doppler imaging. Profunda Femoral Vein: No evidence of thrombus. Normal compressibility and flow on color Doppler imaging. Femoral Vein: No evidence of thrombus. Normal compressibility, respiratory phasicity and response to augmentation. Popliteal Vein: No evidence of thrombus. Normal compressibility, respiratory phasicity and response to augmentation. Calf Veins: No evidence of  thrombus. Normal compressibility and flow on color Doppler imaging. Superficial Great Saphenous Vein: No evidence of thrombus. Normal compressibility. Venous Reflux:  None. Other Findings:  None. IMPRESSION: No evidence of deep venous thrombosis in either lower extremity. Electronically Signed   By: Davina Poke D.O.   On: 04/27/2022 11:10   DG Chest 2 View  Result Date: 04/27/2022 CLINICAL DATA:  Shortness of breath. Cough and congestion since yesterday. History of congestive heart failure. Weakness.  EXAM: CHEST - 2 VIEW COMPARISON:  Chest two views 12/07/2021 FINDINGS: Cardiac silhouette and mediastinal contours are within normal limits. The lungs are clear. No pleural effusion or pneumothorax. Moderate multilevel degenerative disc changes of the thoracic spine. IMPRESSION: No active cardiopulmonary disease. Electronically Signed   By: Yvonne Kendall M.D.   On: 04/27/2022 10:04    Procedures Procedures    Medications Ordered in ED Medications  ipratropium-albuterol (DUONEB) 0.5-2.5 (3) MG/3ML nebulizer solution 3 mL (3 mLs Nebulization Given 04/27/22 1113)  dexamethasone (DECADRON) injection 10 mg (10 mg Intravenous Given 04/27/22 1129)  morphine (PF) 2 MG/ML injection 2 mg (2 mg Intravenous Given 04/27/22 1250)  ipratropium-albuterol (DUONEB) 0.5-2.5 (3) MG/3ML nebulizer solution 3 mL (3 mLs Nebulization Given 04/27/22 1259)    ED Course/ Medical Decision Making/ A&P                           Medical Decision Making Amount and/or Complexity of Data Reviewed Labs: ordered. Radiology: ordered.  Risk Prescription drug management.  Initial Impression and Ddx 52 year old female who presents to the emergency department with multiple symptoms.  She is having dysuria as well as complaining of shortness of breath/cough and lower extremity edema. Patient PMH that increases complexity of ED encounter: Hypertension, GERD, migraine, COPD, bipolar, asthma, PE  Interpretation of Diagnostics I  independent reviewed and interpreted the labs as followed: UA is negative for UTI, CMP without significant abnormality, CBC without leukocytosis.  Anemia stable.  BNP is 195.  COVID and flu is negative, troponin x 2 is negative.  Urine culture sent.  - I independently visualized the following imaging with scope of interpretation limited to determining acute life threatening conditions related to emergency care: Chest x-ray, which revealed no active disease, DVT bilateral lower extremity study is negative  Patient Reassessment and Ultimate Disposition/Management Given Duoneb and steroids after initial assessment with wheezing.  Ordered UA/urine culture for her urinary symptoms and CXR, BNP, troponin with basic labs for her shortness of breath. Obtained bilateral LE DVT study given swelling, although less likely, she has history of DVT/PE. These were negative.  Think that her BLE swelling is likely related to her not taking Lasix over the past two weeks. Edema causing her pain.  UA is negative for UTI. Culture sent anyway but will not treat at this time. Encouraged plenty of fluid.  Regarding her SOB, EKG without ischemia or infarction, troponin x2 negative, doubt ACS  Appears mildly fluid overloaded with lower extremity edema on exam. No rales, but she does have bilateral wheezing. CXR without vascular congestion, pleural effusion, PTX, pneumonia., BNP 195, feel that her LE swelling is related to her CHF but not her shortness of breath. Considered but doubt PE. Wells low risk so will defer d-dimer or CTA PE study at this time. CXR without evidence of pneumonia, pleural effusion or pneumothorax. No recent illnesses and troponin negative so doubt pericarditis or myocarditis  Symptoms inconsistent with aortic dissection     She does have history of asthma and COPD with wheezing. She was given x2 duonebs and steroids here with improvement in symptoms. Likely COPD exacerbation. Will place her on 6 day  course of steroids.  Additionally, represcribing her Lasix to help with LE swelling. Encouraged her f/u soon with PCP but feel she is safe for discharge with return precautions.  The patient has been appropriately medically screened and/or stabilized in the ED. I have low suspicion for any other emergent  medical condition which would require further screening, evaluation or treatment in the ED or require inpatient management. At time of discharge the patient is hemodynamically stable and in no acute distress. I have discussed work-up results and diagnosis with patient and answered all questions. Patient is agreeable with discharge plan. We discussed strict return precautions for returning to the emergency department and they verbalized understanding.     Patient management required discussion with the following services or consulting groups:  None  Complexity of Problems Addressed Chronic illness with exacerbation  Additional Data Reviewed and Analyzed Further history obtained from: Past medical history and medications listed in the EMR, Prior ED visit notes, and Care Everywhere  Patient Encounter Risk Assessment Prescriptions, SDOH impact on management, and Consideration of hospitalization  Final Clinical Impression(s) / ED Diagnoses Final diagnoses:  Peripheral edema  COPD exacerbation ([place])    Rx / DC Orders ED Discharge Orders          Ordered    predniSONE (DELTASONE) 10 MG tablet  Daily        04/27/22 1500    furosemide (LASIX) 20 MG tablet  Daily        04/27/22 1500              Mickie Hillier, PA-C 04/27/22 1505    Teressa Lower, MD 04/27/22 2148

## 2022-04-27 NOTE — ED Notes (Signed)
Pt transported to Xray via stretcher

## 2022-04-27 NOTE — Discharge Instructions (Signed)
You were seen in the emergency department today for shortness of breath and swelling in your lower legs.  You do not have a DVT.  I am sending you a prescription for steroids for your COPD to help improve your symptoms.  Additionally I am resending a prescription of your Lasix to help get fluid off of your legs as I believe this is causing your pain.  Please return to the emergency department for worsening symptoms.

## 2022-04-27 NOTE — ED Triage Notes (Signed)
Pt c/o bilateral flank pain and foul smelling urine that started yesterday when she is straining to urinate. Denies fever. Pt also c/o BLE edema x 3-4 days. Pt reports she used to take Furosemide '40mg'$  for leg swelling, but she has been out of it for about a month. Pt also reports she has "knots" in the back of her legs and is concerned she has blood clots.

## 2022-04-28 LAB — URINE CULTURE: Culture: <10000 — AB

## 2022-05-15 ENCOUNTER — Other Ambulatory Visit: Payer: Self-pay | Admitting: Nurse Practitioner

## 2022-05-15 DIAGNOSIS — M48062 Spinal stenosis, lumbar region with neurogenic claudication: Secondary | ICD-10-CM

## 2022-06-05 ENCOUNTER — Encounter: Payer: Medicare HMO | Admitting: Internal Medicine

## 2022-06-05 ENCOUNTER — Encounter: Payer: Self-pay | Admitting: Internal Medicine

## 2022-06-16 DIAGNOSIS — Z20822 Contact with and (suspected) exposure to covid-19: Secondary | ICD-10-CM | POA: Diagnosis not present

## 2022-06-16 DIAGNOSIS — J209 Acute bronchitis, unspecified: Secondary | ICD-10-CM | POA: Diagnosis not present

## 2022-06-16 DIAGNOSIS — R059 Cough, unspecified: Secondary | ICD-10-CM | POA: Diagnosis not present

## 2022-06-16 DIAGNOSIS — J441 Chronic obstructive pulmonary disease with (acute) exacerbation: Secondary | ICD-10-CM | POA: Diagnosis not present

## 2022-06-19 ENCOUNTER — Emergency Department (HOSPITAL_COMMUNITY): Payer: Medicare HMO

## 2022-06-19 ENCOUNTER — Emergency Department (HOSPITAL_COMMUNITY)
Admission: EM | Admit: 2022-06-19 | Discharge: 2022-06-19 | Disposition: A | Discharge status: Home / Self Care | Payer: Medicare HMO | Attending: Emergency Medicine | Admitting: Emergency Medicine

## 2022-06-19 ENCOUNTER — Other Ambulatory Visit: Payer: Self-pay

## 2022-06-19 ENCOUNTER — Encounter (HOSPITAL_COMMUNITY): Payer: Self-pay | Admitting: *Deleted

## 2022-06-19 DIAGNOSIS — J4541 Moderate persistent asthma with (acute) exacerbation: Secondary | ICD-10-CM | POA: Diagnosis not present

## 2022-06-19 DIAGNOSIS — R0602 Shortness of breath: Secondary | ICD-10-CM | POA: Diagnosis not present

## 2022-06-19 DIAGNOSIS — F17200 Nicotine dependence, unspecified, uncomplicated: Secondary | ICD-10-CM | POA: Diagnosis not present

## 2022-06-19 DIAGNOSIS — U071 COVID-19: Secondary | ICD-10-CM | POA: Diagnosis not present

## 2022-06-19 DIAGNOSIS — Z91010 Allergy to peanuts: Secondary | ICD-10-CM | POA: Insufficient documentation

## 2022-06-19 DIAGNOSIS — Z7951 Long term (current) use of inhaled steroids: Secondary | ICD-10-CM | POA: Insufficient documentation

## 2022-06-19 DIAGNOSIS — Z79899 Other long term (current) drug therapy: Secondary | ICD-10-CM | POA: Diagnosis not present

## 2022-06-19 LAB — CBC
HCT: 39.3 % (ref 36.0–46.0)
Hemoglobin: 13.1 g/dL (ref 12.0–15.0)
MCH: 27.8 pg (ref 26.0–34.0)
MCHC: 33.3 g/dL (ref 30.0–36.0)
MCV: 83.3 fL (ref 80.0–100.0)
Platelets: 639 10*3/uL — ABNORMAL HIGH (ref 150–400)
RBC: 4.72 MIL/uL (ref 3.87–5.11)
RDW: 16.6 % — ABNORMAL HIGH (ref 11.5–15.5)
WBC: 8.7 10*3/uL (ref 4.0–10.5)
nRBC: 0 % (ref 0.0–0.2)

## 2022-06-19 LAB — BASIC METABOLIC PANEL
Anion gap: 6 (ref 5–15)
BUN: 9 mg/dL (ref 6–20)
CO2: 24 mmol/L (ref 22–32)
Calcium: 8.4 mg/dL — ABNORMAL LOW (ref 8.9–10.3)
Chloride: 104 mmol/L (ref 98–111)
Creatinine, Ser: 0.69 mg/dL (ref 0.44–1.00)
GFR, Estimated: >60 mL/min (ref >60)
Glucose, Bld: 119 mg/dL — ABNORMAL HIGH (ref 70–99)
Potassium: 3.5 mmol/L (ref 3.5–5.1)
Sodium: 134 mmol/L — ABNORMAL LOW (ref 135–145)

## 2022-06-19 LAB — RESP PANEL BY RT-PCR (RSV, FLU A&B, COVID)  RVPGX2
Influenza A by PCR: NEGATIVE
Influenza B by PCR: NEGATIVE
Resp Syncytial Virus by PCR: NEGATIVE
SARS Coronavirus 2 by RT PCR: POSITIVE — AB

## 2022-06-19 LAB — BRAIN NATRIURETIC PEPTIDE: B Natriuretic Peptide: 40 pg/mL (ref 0.0–100.0)

## 2022-06-19 LAB — TROPONIN I (HIGH SENSITIVITY): Troponin I (High Sensitivity): 4 ng/L (ref <18)

## 2022-06-19 MED ORDER — HYDROMORPHONE HCL 1 MG/ML IJ SOLN
0.5000 mg | Freq: Once | INTRAMUSCULAR | Status: AC
  Administered 2022-06-19: 0.5 mg via INTRAVENOUS
  Filled 2022-06-19: qty 0.5

## 2022-06-19 MED ORDER — PREDNISONE 20 MG PO TABS: 60.0000 mg | ORAL_TABLET | Freq: Every day | ORAL | 0 refills | Status: DC

## 2022-06-19 MED ORDER — SODIUM CHLORIDE 0.9 % IV SOLN: 2.0000 g | Freq: Once | INTRAVENOUS | Status: DC

## 2022-06-19 MED ORDER — ALBUTEROL SULFATE (2.5 MG/3ML) 0.083% IN NEBU
5.0000 mg | INHALATION_SOLUTION | Freq: Once | RESPIRATORY_TRACT | Status: AC
  Administered 2022-06-19: 5 mg via RESPIRATORY_TRACT
  Filled 2022-06-19: qty 6

## 2022-06-19 MED ORDER — ALBUTEROL SULFATE HFA 108 (90 BASE) MCG/ACT IN AERS: 2.0000 | INHALATION_SPRAY | RESPIRATORY_TRACT | 1 refills | Status: DC | PRN

## 2022-06-19 MED ORDER — LACTATED RINGERS IV BOLUS: 1000.0000 mL | Freq: Once | INTRAVENOUS | Status: DC

## 2022-06-19 MED ORDER — IPRATROPIUM BROMIDE 0.02 % IN SOLN: 0.5000 mg | Freq: Once | RESPIRATORY_TRACT | Status: DC

## 2022-06-19 MED ORDER — METHYLPREDNISOLONE SODIUM SUCC 125 MG IJ SOLR
125.0000 mg | Freq: Once | INTRAMUSCULAR | Status: AC
  Administered 2022-06-19: 125 mg via INTRAVENOUS
  Filled 2022-06-19: qty 2

## 2022-06-19 MED ORDER — ALBUTEROL SULFATE (2.5 MG/3ML) 0.083% IN NEBU: 5.0000 mg | INHALATION_SOLUTION | Freq: Once | RESPIRATORY_TRACT | Status: DC

## 2022-06-19 MED ORDER — IPRATROPIUM BROMIDE 0.02 % IN SOLN
0.5000 mg | Freq: Once | RESPIRATORY_TRACT | Status: AC
  Administered 2022-06-19: 0.5 mg via RESPIRATORY_TRACT
  Filled 2022-06-19: qty 2.5

## 2022-06-19 MED ORDER — SODIUM CHLORIDE 0.9 % IV SOLN: 500.0000 mg | Freq: Once | INTRAVENOUS | Status: DC

## 2022-06-19 MED ORDER — MOLNUPIRAVIR EUA 200MG CAPSULE: 4.0000 | ORAL_CAPSULE | Freq: Two times a day (BID) | ORAL | 0 refills | Status: AC

## 2022-06-19 MED ORDER — ONDANSETRON HCL 4 MG/2ML IJ SOLN
4.0000 mg | Freq: Once | INTRAMUSCULAR | Status: AC
  Administered 2022-06-19: 4 mg via INTRAVENOUS
  Filled 2022-06-19: qty 2

## 2022-06-19 NOTE — ED Notes (Signed)
ED Provider at bedside. 

## 2022-06-19 NOTE — ED Provider Notes (Signed)
[facility] Provider Note   CSN: [id number] Arrival date & time: 06/19/22  [id number]     History  Chief Complaint  Patient presents with   Shortness of Breath    Kathryn [name] is a 52 y.o. female.  Patient c/o non prod cough, congestion, runny nose, sob in past few days. Symptoms acute onset, moderate, persistent. No known specific ill contacts. No sore throat or trouble swallowing. Hx asthma. Occ smoker. Indicates is out of some of her normal meds, not sure of which ones. No chest pain. No abd pain or nvd. No dysuria or gu c/o. W coughing spell, c/o low back pain bilaterally - dull, non radiating. No new leg pain or swelling. No fever or chills.   The history is provided by the patient and medical records.  Shortness of Breath Associated symptoms: cough and wheezing   Associated symptoms: no abdominal pain, no chest pain, no fever, no headaches, no neck pain, no rash, no sore throat and no vomiting        Home Medications Prior to Admission medications   Medication Sig Start Date End Date Taking? Authorizing Provider  albuterol (VENTOLIN HFA) 108 (90 Base) MCG/ACT inhaler Inhale 2 puffs into the lungs every 4 (four) hours as needed for wheezing or shortness of breath. 06/19/22  Yes Lajean Saver, MD  molnupiravir EUA (LAGEVRIO) 200 mg CAPS capsule Take 4 capsules (800 mg total) by mouth 2 (two) times daily for 5 days. 06/19/22 06/24/22 Yes Lajean Saver, MD  predniSONE (DELTASONE) 20 MG tablet Take 3 tablets (60 mg total) by mouth daily. 06/20/22  Yes Lajean Saver, MD  albuterol (VENTOLIN HFA) 108 (90 Base) MCG/ACT inhaler Inhale 1-2 puffs into the lungs every 4 (four) hours as needed for wheezing or shortness of breath. 12/22/21   [provider]  amLODipine (NORVASC) 5 MG tablet Take 1 tablet (5 mg total) by mouth daily. 02/02/22   Lindell Spar, MD  anagrelide (AGRYLIN) 1 MG capsule Take 1 capsule (1 mg total) by mouth 2 (two) times  daily. 01/14/22   Larita Fife, MD  BREO ELLIPTA 200-25 MCG/ACT AEPB INHALE 1 PUFF EVERY DAY 03/06/22   Lindell Spar, MD  cholecalciferol (VITAMIN D3) 25 MCG (1000 UNIT) tablet Take 1,000 Units by mouth daily.    [provider]  citalopram (CELEXA) 20 MG tablet Take 20 mg by mouth daily. 04/12/22   [provider]  DULoxetine (CYMBALTA) 60 MG capsule Take 1 capsule (60 mg total) by mouth daily. 01/15/22 02/14/22  Larita Fife, MD  furosemide (LASIX) 20 MG tablet Take 1 tablet (20 mg total) by mouth daily. 04/27/22   Mickie Hillier, PA-C  hydrOXYzine (VISTARIL) 50 MG capsule Take 100 mg by mouth 2 (two) times daily. 04/12/22   [provider]  lubiprostone (AMITIZA) 24 MCG capsule Take 1 capsule (24 mcg total) by mouth 2 (two) times daily with a meal. Patient not taking: Reported on 04/27/2022 10/22/21   Nicholes Rough, NP  lurasidone (LATUDA) 40 MG TABS tablet Take 1 tablet (40 mg total) by mouth daily with breakfast. 01/15/22   Larita Fife, MD  methocarbamol (ROBAXIN) 500 MG tablet Take 1 tablet (500 mg total) by mouth 2 (two) times daily. Patient not taking: Reported on 04/27/2022 02/02/22   Lindell Spar, MD  mometasone-formoterol [facility]) 100-5 MCG/ACT AERO Inhale 2 puffs into the lungs 2 (two) times daily. Patient not taking: Reported on 02/02/2022 01/14/22   Paliy,  Delrae Rend, MD  omeprazole (PRILOSEC) 20 MG capsule Take 20 mg by mouth daily. 12/22/21   [provider]  pantoprazole (PROTONIX) 40 MG tablet Take 1 tablet (40 mg total) by mouth daily. 01/14/22   Larita Fife, MD  potassium chloride SA (KLOR-CON M) 20 MEQ tablet Take 1 tablet (20 mEq total) by mouth daily. TAKE (1) TABLET BY MOUTH ONCE A DAY. Strength: 20 mEq 12/16/21   Lindell Spar, MD  pregabalin (LYRICA) 150 MG capsule Take 1 capsule (150 mg total) by mouth 2 (two) times daily. 03/08/22   Johnette Abraham, MD  risperiDONE (RISPERDAL) 0.5 MG tablet Take 0.5 mg by mouth 2 (two) times daily. 04/12/22    [provider]  traZODone (DESYREL) 100 MG tablet Take 100 mg by mouth at bedtime. 11/17/21   [provider]  Vitamin D, Ergocalciferol, (DRISDOL) 1.25 MG (50000 UNIT) CAPS capsule TAKE 1 CAPSULE THE SAME DAY EACH WEEK. Patient not taking: Reported on 04/27/2022 04/13/22   Lindell Spar, MD      Allergies    Fish allergy, Flexeril [cyclobenzaprine hcl], Ibuprofen, Shellfish allergy, Tylenol [acetaminophen], Ace inhibitors, Peanut allergen powder-dnfp, Tramadol, and Trazodone and nefazodone    Review of Systems   Review of Systems  Constitutional:  Negative for chills and fever.  HENT:  Positive for congestion and rhinorrhea. Negative for sore throat.   Eyes:  Negative for redness.  Respiratory:  Positive for cough, shortness of breath and wheezing.   Cardiovascular:  Negative for chest pain and palpitations.  Gastrointestinal:  Negative for abdominal pain, diarrhea and vomiting.  Genitourinary:  Negative for dysuria and flank pain.  Musculoskeletal:  Positive for back pain and myalgias. Negative for neck pain and neck stiffness.  Skin:  Negative for rash.  Neurological:  Negative for headaches.  Hematological:  Does not bruise/bleed easily.  Psychiatric/Behavioral:  Negative for confusion.     Physical Exam Updated Vital Signs BP 122/75   Pulse 80   Temp 98.4 F (36.9 C) (Oral)   Resp 19   Ht 1.499 m ('4\' 11"'$ )   Wt 61.2 kg   LMP 06/07/2020   SpO2 96%   BMI 27.27 kg/m  Physical Exam Vitals and nursing note reviewed.  Constitutional:      Appearance: Normal appearance. She is well-developed.  HENT:     Head: Atraumatic.     Nose: Nose normal.     Mouth/Throat:     Mouth: Mucous membranes are moist.     Pharynx: Oropharynx is clear.  Eyes:     General: No scleral icterus.    Conjunctiva/sclera: Conjunctivae normal.     Pupils: Pupils are equal, round, and reactive to light.  Neck:     Trachea: No tracheal deviation.     Comments: No stiffness or  rigidity Cardiovascular:     Rate and Rhythm: Normal rate and regular rhythm.     Pulses: Normal pulses.     Heart sounds: Normal heart sounds. No murmur heard.    No friction rub. No gallop.  Pulmonary:     Effort: Pulmonary effort is normal. No respiratory distress.     Breath sounds: Wheezing present.  Abdominal:     General: Bowel sounds are normal. There is no distension.     Palpations: Abdomen is soft. There is no mass.     Tenderness: There is no abdominal tenderness.  Genitourinary:    Comments: No cva tenderness.  Musculoskeletal:        General:  No tenderness.     Cervical back: Normal range of motion and neck supple. No rigidity. No muscular tenderness.     Comments: T/L/S spine non tender, aligned. Bilateral lumbar muscular tenderness. Mild, symmetric bilateral foot/ankle swelling.   Lymphadenopathy:     Cervical: No cervical adenopathy.  Skin:    General: Skin is warm and dry.     Findings: No rash.  Neurological:     Mental Status: She is alert.     Comments: Alert, speech normal.   Psychiatric:        Mood and Affect: Mood normal.     ED Results / Procedures / Treatments   Labs (all labs ordered are listed, but only abnormal results are displayed) Results for orders placed or performed during the hospital encounter of 06/19/22  Resp panel by RT-PCR (RSV, Flu A&B, Covid) Anterior Nasal Swab   Specimen: Anterior Nasal Swab  Result Value Ref Range   SARS Coronavirus 2 by RT PCR POSITIVE (A) NEGATIVE   Influenza A by PCR NEGATIVE NEGATIVE   Influenza B by PCR NEGATIVE NEGATIVE   Resp Syncytial Virus by PCR NEGATIVE NEGATIVE  CBC  Result Value Ref Range   WBC 8.7 4.0 - 10.5 K/uL   RBC 4.72 3.87 - 5.11 MIL/uL   Hemoglobin 13.1 12.0 - 15.0 g/dL   HCT 39.3 36.0 - 46.0 %   MCV 83.3 80.0 - 100.0 fL   MCH 27.8 26.0 - 34.0 pg   MCHC 33.3 30.0 - 36.0 g/dL   RDW 16.6 (H) 11.5 - 15.5 %   Platelets 639 (H) 150 - 400 K/uL   nRBC 0.0 0.0 - 0.2 %  Basic metabolic  panel  Result Value Ref Range   Sodium 134 (L) 135 - 145 mmol/L   Potassium 3.5 3.5 - 5.1 mmol/L   Chloride 104 98 - 111 mmol/L   CO2 24 22 - 32 mmol/L   Glucose, Bld 119 (H) 70 - 99 mg/dL   BUN 9 6 - 20 mg/dL   Creatinine, Ser 0.69 0.44 - 1.00 mg/dL   Calcium 8.4 (L) 8.9 - 10.3 mg/dL   GFR, Estimated >60 >60 mL/min   Anion gap 6 5 - 15  Brain natriuretic peptide  Result Value Ref Range   B Natriuretic Peptide 40.0 0.0 - 100.0 pg/mL  Troponin I (High Sensitivity)  Result Value Ref Range   Troponin I (High Sensitivity) 4 <18 ng/L   DG Chest Port 1 View  Result Date: 06/19/2022 CLINICAL DATA:  Shortness of breath. EXAM: PORTABLE CHEST 1 VIEW COMPARISON:  Chest x-ray April 27, 2022 FINDINGS: The heart size and mediastinal contours are within normal limits. Both lungs are clear. The visualized skeletal structures are unremarkable. IMPRESSION: No active disease. Electronically Signed   By: Dorise Bullion III M.D.   On: 06/19/2022 08:02     EKG EKG Interpretation  Date/Time:  Monday June 19 2022 07:36:22 EST Ventricular Rate:  96 PR Interval:  137 QRS Duration: 101 QT Interval:  364 QTC Calculation: 460 R Axis:   104 Text Interpretation: Sinus rhythm Right axis deviation Non-specific ST-t changes No significant change since last tracing Confirmed by Lajean Saver [phone]) on 06/19/2022 7:40:40 AM  Radiology DG Chest Port 1 View  Result Date: 06/19/2022 CLINICAL DATA:  Shortness of breath. EXAM: PORTABLE CHEST 1 VIEW COMPARISON:  Chest x-ray April 27, 2022 FINDINGS: The heart size and mediastinal contours are within normal limits. Both lungs are clear. The visualized skeletal structures are  unremarkable. IMPRESSION: No active disease. Electronically Signed   By: Dorise Bullion III M.D.   On: 06/19/2022 08:02    Procedures Procedures    Medications Ordered in ED Medications  methylPREDNISolone sodium succinate (SOLU-MEDROL) 125 mg/2 mL injection 125 mg (125 mg Intravenous  Given 06/19/22 0908)  albuterol (PROVENTIL) (2.5 MG/3ML) 0.083% nebulizer solution 5 mg (5 mg Nebulization Given 06/19/22 0824)  ipratropium (ATROVENT) nebulizer solution 0.5 mg (0.5 mg Nebulization Given 06/19/22 0824)  HYDROmorphone (DILAUDID) injection 0.5 mg (0.5 mg Intravenous Given 06/19/22 0901)  ondansetron (ZOFRAN) injection 4 mg (4 mg Intravenous Given 06/19/22 0903)  albuterol (PROVENTIL) (2.5 MG/3ML) 0.083% nebulizer solution 5 mg (5 mg Nebulization Given 06/19/22 0949)  ipratropium (ATROVENT) nebulizer solution 0.5 mg (0.5 mg Nebulization Given 06/19/22 0948)    ED Course/ Medical Decision Making/ A&P                             Medical Decision Making Problems Addressed: COVID-19 virus infection: acute illness or injury with systemic symptoms that poses a threat to life or bodily functions Moderate persistent asthma with exacerbation: acute illness or injury with systemic symptoms that poses a threat to life or bodily functions  Amount and/or Complexity of Data Reviewed External Data Reviewed: notes. Labs: ordered. Decision-making details documented in ED Course. Radiology: ordered and independent interpretation performed. Decision-making details documented in ED Course. ECG/medicine tests: ordered and independent interpretation performed. Decision-making details documented in ED Course.  Risk Prescription drug management. Decision regarding hospitalization.   Iv ns. Continuous pulse ox and cardiac monitoring. Labs ordered/sent. Imaging ordered.   Differential diagnosis includes asthma exacerbation, pna, bronchitis, viral uri, fluid overload, etc. Dispo decision including potential need for admission considered - will get labs and imaging and reassess.   Reviewed nursing notes and prior charts for additional history. External reports reviewed.   Albuterol/atrovent tx. Pt requests pain med for back, dilaudid .5 mg iv.   Cardiac monitor: sinus rhythm, rate 90.  Labs  reviewed/interpreted by me - covid is positive. Bnp normal. Trop normal.   Xrays reviewed/interpreted by me - no def pna.   Recheck, improving wheezing, additional albuterol tx.   Recheck, pt breathing comfortably.   Pt currently appears stable for d/c.           Final Clinical Impression(s) / ED Diagnoses Final diagnoses:  COVID-19 virus infection  Moderate persistent asthma with exacerbation    Rx / DC Orders ED Discharge Orders          Ordered    predniSONE (DELTASONE) 20 MG tablet  Daily        06/19/22 0959    albuterol (VENTOLIN HFA) 108 (90 Base) MCG/ACT inhaler  Every 4 hours PRN        06/19/22 0959    molnupiravir EUA (LAGEVRIO) 200 mg CAPS capsule  2 times daily        06/19/22 0959              Lajean Saver, MD 06/19/22 1011

## 2022-06-19 NOTE — Discharge Instructions (Signed)
It was our pleasure to provide your ER care today - we hope that you feel better.  Your covid test is positive. Drink plenty of fluids/stay well hydrated. Stay active/take full and deep breaths. Avoid any smoking. Use albuterol inhaler as need and taking prednisone as prescribed. You may also take molnupiravir as prescribed.   Follow up with primary care doctor in two weeks if symptoms fail to improve/resolve.  Return to ER if worse, new symptoms, increased trouble breathing, or other emergency concern.

## 2022-06-19 NOTE — ED Triage Notes (Signed)
Pt states she has been feeling sob and feeling like she is retaining fluid x 2 days; pt c/o non-productive cough with chest pain

## 2022-06-27 ENCOUNTER — Other Ambulatory Visit: Payer: Self-pay

## 2022-06-27 ENCOUNTER — Emergency Department (HOSPITAL_COMMUNITY): Payer: Medicare HMO

## 2022-06-27 ENCOUNTER — Encounter (HOSPITAL_COMMUNITY): Payer: Self-pay

## 2022-06-27 ENCOUNTER — Emergency Department (HOSPITAL_COMMUNITY)
Admission: EM | Admit: 2022-06-27 | Discharge: 2022-06-27 | Disposition: A | Discharge status: Home / Self Care | Payer: Medicare HMO | Attending: Emergency Medicine | Admitting: Emergency Medicine

## 2022-06-27 DIAGNOSIS — J441 Chronic obstructive pulmonary disease with (acute) exacerbation: Secondary | ICD-10-CM | POA: Insufficient documentation

## 2022-06-27 DIAGNOSIS — R300 Dysuria: Secondary | ICD-10-CM | POA: Diagnosis not present

## 2022-06-27 DIAGNOSIS — R0602 Shortness of breath: Secondary | ICD-10-CM | POA: Diagnosis not present

## 2022-06-27 DIAGNOSIS — R069 Unspecified abnormalities of breathing: Secondary | ICD-10-CM | POA: Diagnosis not present

## 2022-06-27 DIAGNOSIS — F39 Unspecified mood [affective] disorder: Secondary | ICD-10-CM

## 2022-06-27 DIAGNOSIS — U071 COVID-19: Secondary | ICD-10-CM | POA: Diagnosis not present

## 2022-06-27 DIAGNOSIS — J45909 Unspecified asthma, uncomplicated: Secondary | ICD-10-CM | POA: Insufficient documentation

## 2022-06-27 DIAGNOSIS — R000 Tachycardia, unspecified: Secondary | ICD-10-CM | POA: Diagnosis not present

## 2022-06-27 DIAGNOSIS — M549 Dorsalgia, unspecified: Secondary | ICD-10-CM | POA: Diagnosis not present

## 2022-06-27 DIAGNOSIS — M48062 Spinal stenosis, lumbar region with neurogenic claudication: Secondary | ICD-10-CM

## 2022-06-27 DIAGNOSIS — R062 Wheezing: Secondary | ICD-10-CM | POA: Diagnosis not present

## 2022-06-27 DIAGNOSIS — Z8616 Personal history of COVID-19: Secondary | ICD-10-CM | POA: Diagnosis not present

## 2022-06-27 DIAGNOSIS — Z20822 Contact with and (suspected) exposure to covid-19: Secondary | ICD-10-CM | POA: Insufficient documentation

## 2022-06-27 DIAGNOSIS — Z79899 Other long term (current) drug therapy: Secondary | ICD-10-CM | POA: Diagnosis not present

## 2022-06-27 DIAGNOSIS — I10 Essential (primary) hypertension: Secondary | ICD-10-CM | POA: Insufficient documentation

## 2022-06-27 LAB — RESP PANEL BY RT-PCR (RSV, FLU A&B, COVID)  RVPGX2
Influenza A by PCR: NEGATIVE
Influenza B by PCR: NEGATIVE
Resp Syncytial Virus by PCR: NEGATIVE
SARS Coronavirus 2 by RT PCR: NEGATIVE

## 2022-06-27 MED ORDER — IPRATROPIUM-ALBUTEROL 0.5-2.5 (3) MG/3ML IN SOLN
3.0000 mL | Freq: Once | RESPIRATORY_TRACT | Status: AC
  Administered 2022-06-27: 3 mL via RESPIRATORY_TRACT
  Filled 2022-06-27: qty 3

## 2022-06-27 MED ORDER — METHYLPREDNISOLONE SODIUM SUCC 125 MG IJ SOLR
125.0000 mg | Freq: Once | INTRAMUSCULAR | Status: AC
  Administered 2022-06-27: 125 mg via INTRAVENOUS
  Filled 2022-06-27: qty 2

## 2022-06-27 MED ORDER — DULOXETINE HCL 60 MG PO CPEP: 60.0000 mg | ORAL_CAPSULE | Freq: Every day | ORAL | 0 refills | Status: DC

## 2022-06-27 MED ORDER — ALBUTEROL SULFATE (2.5 MG/3ML) 0.083% IN NEBU: 2.5000 mg | INHALATION_SOLUTION | Freq: Four times a day (QID) | RESPIRATORY_TRACT | 12 refills | Status: DC | PRN

## 2022-06-27 MED ORDER — ALBUTEROL SULFATE HFA 108 (90 BASE) MCG/ACT IN AERS: 2.0000 | INHALATION_SPRAY | RESPIRATORY_TRACT | 1 refills | Status: DC | PRN

## 2022-06-27 MED ORDER — HYDROXYZINE PAMOATE 25 MG PO CAPS: 100.0000 mg | ORAL_CAPSULE | Freq: Four times a day (QID) | ORAL | 0 refills | Status: DC | PRN

## 2022-06-27 MED ORDER — PREGABALIN 150 MG PO CAPS: 150.0000 mg | ORAL_CAPSULE | Freq: Two times a day (BID) | ORAL | 0 refills | Status: DC

## 2022-06-27 MED ORDER — OXYCODONE HCL 5 MG PO TABS
5.0000 mg | ORAL_TABLET | Freq: Once | ORAL | Status: AC
  Administered 2022-06-27: 5 mg via ORAL
  Filled 2022-06-27: qty 1

## 2022-06-27 MED ORDER — PREDNISONE 50 MG PO TABS: 50.0000 mg | ORAL_TABLET | Freq: Every day | ORAL | 0 refills | Status: DC

## 2022-06-27 MED ORDER — LURASIDONE HCL 40 MG PO TABS: 40.0000 mg | ORAL_TABLET | Freq: Every day | ORAL | 0 refills | Status: DC

## 2022-06-27 MED ORDER — ALBUTEROL SULFATE HFA 108 (90 BASE) MCG/ACT IN AERS
2.0000 | INHALATION_SPRAY | RESPIRATORY_TRACT | Status: DC | PRN
  Administered 2022-06-27: 2 via RESPIRATORY_TRACT
  Filled 2022-06-27: qty 6.7

## 2022-06-27 NOTE — ED Notes (Signed)
Entered pt room to discharge pt. Pt states she is going to check back in because she "needs to go to rehab or somewhere because she is suicidal. I need some help, I'm depressed, I'm stressed & I don't have no where to go.  I'm not trying to hurt myself, or anyone else but I need inpatient treatment. I'm not going to lie I need help & I need an inpatient hospital. I don't want to go back on the streets." Discharge held & MD made aware.

## 2022-06-27 NOTE — ED Notes (Signed)
Called RT  regarding neb

## 2022-06-27 NOTE — ED Provider Notes (Signed)
Pt discharged earlier. Pt indicates would like to get behavioral health follow up.  Pt denies any desire, or plan to harm self, but does acknowledge intermittent issues with anxiety and depression. She indicates has been out of her meds for past couple months. States has previously followed up at [facility].  Pt does indicate has new insurance which is now active so she feels she will be able to get her meds moving forward.   Pt with normal mood and affect. Does not appear acutely depressed. Is eating/drinking well. Normal appetite. No suicidal intent or plans. Pt is not responding to internal stimuli - no acute psychosis is noted.   Pt appears stable for outpatient follow up. Will provide resources/guide, and new rx until can see her doctors in f/u.      Lajean Saver, MD 06/27/22 [phone]

## 2022-06-27 NOTE — ED Provider Notes (Signed)
[facility] Provider Note   CSN: [id number] Arrival date & time: 06/27/22  0441     History  Chief Complaint  Patient presents with   Shortness of Breath    Kathryn [name] is a 52 y.o. female.  The history is provided by the patient.  Shortness of Breath She has history of hypertension, asthma, bipolar disorder and comes in with cough and shortness of breath which started tonight.  Cough is nonproductive.  She denies fever but has had some chills.  She denies any sweats.  She is also complaining of aching in her back and dysuria which started tonight.  She was in the emergency department 1 week ago and diagnosed with COVID-19 and was prescribed molnupiravir, but was unable to get the prescription filled because of cost.  Also, she has run out of her albuterol inhaler and nebulizer at home.   Home Medications Prior to Admission medications   Medication Sig Start Date End Date Taking? Authorizing Provider  albuterol (VENTOLIN HFA) 108 (90 Base) MCG/ACT inhaler Inhale 1-2 puffs into the lungs every 4 (four) hours as needed for wheezing or shortness of breath. 12/22/21   [provider]  albuterol (VENTOLIN HFA) 108 (90 Base) MCG/ACT inhaler Inhale 2 puffs into the lungs every 4 (four) hours as needed for wheezing or shortness of breath. 06/19/22   Lajean Saver, MD  amLODipine (NORVASC) 5 MG tablet Take 1 tablet (5 mg total) by mouth daily. 02/02/22   Lindell Spar, MD  anagrelide (AGRYLIN) 1 MG capsule Take 1 capsule (1 mg total) by mouth 2 (two) times daily. 01/14/22   Larita Fife, MD  BREO ELLIPTA 200-25 MCG/ACT AEPB INHALE 1 PUFF EVERY DAY 03/06/22   Lindell Spar, MD  cholecalciferol (VITAMIN D3) 25 MCG (1000 UNIT) tablet Take 1,000 Units by mouth daily.    [provider]  citalopram (CELEXA) 20 MG tablet Take 20 mg by mouth daily. 04/12/22   [provider]  DULoxetine (CYMBALTA) 60 MG capsule Take 1 capsule  (60 mg total) by mouth daily. 01/15/22 02/14/22  Larita Fife, MD  furosemide (LASIX) 20 MG tablet Take 1 tablet (20 mg total) by mouth daily. 04/27/22   Mickie Hillier, PA-C  hydrOXYzine (VISTARIL) 50 MG capsule Take 100 mg by mouth 2 (two) times daily. 04/12/22   [provider]  lubiprostone (AMITIZA) 24 MCG capsule Take 1 capsule (24 mcg total) by mouth 2 (two) times daily with a meal. Patient not taking: Reported on 04/27/2022 10/22/21   Nicholes Rough, NP  lurasidone (LATUDA) 40 MG TABS tablet Take 1 tablet (40 mg total) by mouth daily with breakfast. 01/15/22   Larita Fife, MD  methocarbamol (ROBAXIN) 500 MG tablet Take 1 tablet (500 mg total) by mouth 2 (two) times daily. Patient not taking: Reported on 04/27/2022 02/02/22   Lindell Spar, MD  mometasone-formoterol [facility]) 100-5 MCG/ACT AERO Inhale 2 puffs into the lungs 2 (two) times daily. Patient not taking: Reported on 02/02/2022 01/14/22   Larita Fife, MD  omeprazole (PRILOSEC) 20 MG capsule Take 20 mg by mouth daily. 12/22/21   [provider]  pantoprazole (PROTONIX) 40 MG tablet Take 1 tablet (40 mg total) by mouth daily. 01/14/22   Larita Fife, MD  potassium chloride SA (KLOR-CON M) 20 MEQ tablet Take 1 tablet (20 mEq total) by mouth daily. TAKE (1) TABLET BY MOUTH ONCE A DAY. Strength: 20 mEq 12/16/21   Lindell Spar,  MD  predniSONE (DELTASONE) 20 MG tablet Take 3 tablets (60 mg total) by mouth daily. 06/20/22   Lajean Saver, MD  pregabalin (LYRICA) 150 MG capsule Take 1 capsule (150 mg total) by mouth 2 (two) times daily. 03/08/22   Johnette Abraham, MD  risperiDONE (RISPERDAL) 0.5 MG tablet Take 0.5 mg by mouth 2 (two) times daily. 04/12/22   [provider]  traZODone (DESYREL) 100 MG tablet Take 100 mg by mouth at bedtime. 11/17/21   [provider]  Vitamin D, Ergocalciferol, (DRISDOL) 1.25 MG (50000 UNIT) CAPS capsule TAKE 1 CAPSULE THE SAME DAY EACH WEEK. Patient not taking: Reported on  04/27/2022 04/13/22   Lindell Spar, MD      Allergies    Fish allergy, Flexeril [cyclobenzaprine hcl], Ibuprofen, Shellfish allergy, Tylenol [acetaminophen], Ace inhibitors, Peanut allergen powder-dnfp, Tramadol, and Trazodone and nefazodone    Review of Systems   Review of Systems  Respiratory:  Positive for shortness of breath.   All other systems reviewed and are negative.   Physical Exam Updated Vital Signs BP 108/87   Pulse 97   Temp 97.9 F (36.6 C)   Resp (!) 21   Ht '4\' 11"'$  (1.499 m)   Wt 62 kg   LMP 06/07/2020   SpO2 99%   BMI 27.61 kg/m  Physical Exam Vitals and nursing note reviewed.   52 year old female, resting comfortably and in no acute distress. Vital signs are significant for borderline elevated respiratory rate. Oxygen saturation is 99%, which is normal. Head is normocephalic and atraumatic. PERRLA, EOMI. Oropharynx is clear. Neck is nontender and supple without adenopathy or JVD. Back is nontender and there is no CVA tenderness. Lungs have diffuse inspiratory and expiratory wheezes without rales or rhonchi. Chest is nontender. Heart has regular rate and rhythm without murmur. Abdomen is soft, flat, nontender. Extremities have no cyanosis or edema, full range of motion is present. Skin is warm and dry without rash. Neurologic: Mental status is normal, cranial nerves are intact, moves all extremities equally.  ED Results / Procedures / Treatments   Labs (all labs ordered are listed, but only abnormal results are displayed) Labs Reviewed  RESP PANEL BY RT-PCR (RSV, FLU A&B, COVID)  [id number]  URINALYSIS, W/ REFLEX TO CULTURE (INFECTION SUSPECTED)    EKG EKG Interpretation  Date/Time:  Tuesday June 27 2022 04:50:20 EDT Ventricular Rate:  103 PR Interval:  139 QRS Duration: 77 QT Interval:  355 QTC Calculation: 465 R Axis:   31 Text Interpretation: Sinus tachycardia Consider right atrial enlargement Borderline low voltage, extremity leads When  compared with ECG of 06/19/2022, Rightward axis is no longer present Confirmed by Delora Fuel ([id number]) on 06/27/2022 5:21:30 AM  Radiology DG Chest Port 1 View  Result Date: 06/27/2022 CLINICAL DATA:  SOB.  Recent positive COVID test. EXAM: PORTABLE CHEST 1 VIEW COMPARISON:  06/19/2018 for FINDINGS: The heart size and mediastinal contours are within normal limits. Both lungs are clear. The visualized skeletal structures are unremarkable. IMPRESSION: No active disease. Electronically Signed   By: Kerby Moors M.D.   On: 06/27/2022 05:19    Procedures Procedures  Cardiac monitor shows normal sinus rhythm, per my interpretation.  Medications Ordered in ED Medications  albuterol (VENTOLIN HFA) 108 (90 Base) MCG/ACT inhaler 2 puff (has no administration in time range)  oxyCODONE (Oxy IR/ROXICODONE) immediate release tablet 5 mg (has no administration in time range)  ipratropium-albuterol (DUONEB) 0.5-2.5 (3) MG/3ML nebulizer solution 3 mL (has no administration  in time range)  methylPREDNISolone sodium succinate (SOLU-MEDROL) 125 mg/2 mL injection 125 mg (has no administration in time range)    ED Course/ Medical Decision Making/ A&P                             Medical Decision Making Amount and/or Complexity of Data Reviewed Radiology: ordered.  Risk Prescription drug management.   Exacerbation of asthma in the setting of recent infection with COVID-19.  New onset of symptoms of cough and bodyaches suggest possible superimposed viral infection, also consider possible pneumonia.  I have reviewed her past records and DC ED visit on 06/19/2022 with diagnosis of COVID-19.  She was prescribed prednisone and albuterol inhaler at that time.  I have ordered nebulizer treatment with albuterol and ipratropium and intravenous methylprednisolone.  I have reviewed her chest x-ray and see no evidence of pneumonia with official radiologist interpretation pending.  I have also ordered urinalysis because of  her complaints of dysuria.  Radiologist agrees no evidence of pneumonia.  She denies any improvement following albuterol with ipratropium.  On reexam, coarse wheezes and rhonchi are still present.  I have ordered a second nebulizer treatment.  Following second nebulizer treatment, patient feels much better and lungs are completely clear.  I have reviewed and interpreted her laboratory test and my interpretation is negative respiratory pathogen panel, no evidence of active COVID-19 infection, RSV infection influenza infection.  I am discharging her with prescription for 5-day course of prednisone, refills for albuterol inhaler and solution for her nebulizer.  She is also requesting a refill for her pregabalin, and I have ordered a 1 month supply for that.  Final Clinical Impression(s) / ED Diagnoses Final diagnoses:  COPD exacerbation ([place])    Rx / DC Orders ED Discharge Orders          Ordered    albuterol (VENTOLIN HFA) 108 (90 Base) MCG/ACT inhaler  Every 4 hours PRN        06/27/22 0752    albuterol (PROVENTIL) (2.5 MG/3ML) 0.083% nebulizer solution  Every 6 hours PRN        06/27/22 0752    pregabalin (LYRICA) 150 MG capsule  2 times daily        06/27/22 0752    predniSONE (DELTASONE) 50 MG tablet  Daily        06/27/22 [id number]              Delora Fuel, MD [id number] [phone]

## 2022-06-27 NOTE — ED Notes (Signed)
Gave pt blankets . Turned lights out.

## 2022-06-27 NOTE — ED Triage Notes (Addendum)
[facility] from home. Pt thinks its her asthma. Had 2 nebs  pta. 91% on room air prior. 100 after   Says she is out of nebs and lasix. Was covid + here on 3/4

## 2022-06-27 NOTE — ED Notes (Signed)
MD at bedside. 

## 2022-06-27 NOTE — Discharge Instructions (Addendum)
It was our pleasure to provide your ER care today - we hope that you feel better.  Continue to avoid drug use as it is harmful to your physical health and mental well-being. See resource guide attached in terms of accessing inpatient or outpatient  treatment programs, and other behavioral health resources   Continue your behavioral health meds - new prescriptions were sent to your pharmacy.  Follow up closely with primary care doctor and behavioral health provider in the coming week - discuss their plan with your meds moving forward . Follow up with [facility] in the coming week - call today to arrange appointment.   For mental health issues and/or crisis, you may also go directly to the [facility] - they are open 24/7 and walk-ins are welcome.    Return to ER if worse, new symptoms, fevers, chest pain, trouble breathing, or other emergency concern.

## 2022-06-27 NOTE — ED Notes (Signed)
Called RT about neb

## 2022-06-27 NOTE — ED Notes (Signed)
COVID/flu/RSV test taken to lab.

## 2022-06-29 ENCOUNTER — Emergency Department (HOSPITAL_COMMUNITY)
Admission: EM | Admit: 2022-06-29 | Discharge: 2022-06-30 | Disposition: A | Discharge status: Other Acute Inpatient Hospital | Payer: Medicare HMO | Attending: Emergency Medicine | Admitting: Emergency Medicine

## 2022-06-29 ENCOUNTER — Other Ambulatory Visit: Payer: Self-pay

## 2022-06-29 ENCOUNTER — Encounter (HOSPITAL_COMMUNITY): Payer: Self-pay | Admitting: *Deleted

## 2022-06-29 DIAGNOSIS — Z79899 Other long term (current) drug therapy: Secondary | ICD-10-CM | POA: Diagnosis not present

## 2022-06-29 DIAGNOSIS — Z91010 Allergy to peanuts: Secondary | ICD-10-CM | POA: Insufficient documentation

## 2022-06-29 DIAGNOSIS — R9431 Abnormal electrocardiogram [ECG] [EKG]: Secondary | ICD-10-CM | POA: Diagnosis not present

## 2022-06-29 DIAGNOSIS — F17210 Nicotine dependence, cigarettes, uncomplicated: Secondary | ICD-10-CM | POA: Diagnosis not present

## 2022-06-29 DIAGNOSIS — D75839 Thrombocytosis, unspecified: Secondary | ICD-10-CM | POA: Insufficient documentation

## 2022-06-29 DIAGNOSIS — E871 Hypo-osmolality and hyponatremia: Secondary | ICD-10-CM | POA: Insufficient documentation

## 2022-06-29 DIAGNOSIS — F1410 Cocaine abuse, uncomplicated: Secondary | ICD-10-CM | POA: Diagnosis not present

## 2022-06-29 DIAGNOSIS — R45851 Suicidal ideations: Secondary | ICD-10-CM | POA: Insufficient documentation

## 2022-06-29 DIAGNOSIS — D72829 Elevated white blood cell count, unspecified: Secondary | ICD-10-CM | POA: Diagnosis not present

## 2022-06-29 DIAGNOSIS — R739 Hyperglycemia, unspecified: Secondary | ICD-10-CM | POA: Insufficient documentation

## 2022-06-29 DIAGNOSIS — J449 Chronic obstructive pulmonary disease, unspecified: Secondary | ICD-10-CM | POA: Insufficient documentation

## 2022-06-29 DIAGNOSIS — I10 Essential (primary) hypertension: Secondary | ICD-10-CM | POA: Insufficient documentation

## 2022-06-29 LAB — COMPREHENSIVE METABOLIC PANEL
ALT: 23 U/L (ref 0–44)
AST: 23 U/L (ref 15–41)
Albumin: 3.6 g/dL (ref 3.5–5.0)
Alkaline Phosphatase: 77 U/L (ref 38–126)
Anion gap: 10 (ref 5–15)
BUN: 12 mg/dL (ref 6–20)
CO2: 22 mmol/L (ref 22–32)
Calcium: 8.4 mg/dL — ABNORMAL LOW (ref 8.9–10.3)
Chloride: 102 mmol/L (ref 98–111)
Creatinine, Ser: 0.7 mg/dL (ref 0.44–1.00)
GFR, Estimated: >60 mL/min (ref >60)
Glucose, Bld: 141 mg/dL — ABNORMAL HIGH (ref 70–99)
Potassium: 3.7 mmol/L (ref 3.5–5.1)
Sodium: 134 mmol/L — ABNORMAL LOW (ref 135–145)
Total Bilirubin: 0.4 mg/dL (ref 0.3–1.2)
Total Protein: 7.1 g/dL (ref 6.5–8.1)

## 2022-06-29 LAB — CBC WITH DIFFERENTIAL/PLATELET
Abs Immature Granulocytes: 0.14 10*3/uL — ABNORMAL HIGH (ref 0.00–0.07)
Basophils Absolute: 0.1 10*3/uL (ref 0.0–0.1)
Basophils Relative: 0 %
Eosinophils Absolute: 0.1 10*3/uL (ref 0.0–0.5)
Eosinophils Relative: 1 %
HCT: 40.2 % (ref 36.0–46.0)
Hemoglobin: 12.7 g/dL (ref 12.0–15.0)
Immature Granulocytes: 1 %
Lymphocytes Relative: 11 %
Lymphs Abs: 1.5 10*3/uL (ref 0.7–4.0)
MCH: 27.4 pg (ref 26.0–34.0)
MCHC: 31.6 g/dL (ref 30.0–36.0)
MCV: 86.8 fL (ref 80.0–100.0)
Monocytes Absolute: 0.4 10*3/uL (ref 0.1–1.0)
Monocytes Relative: 3 %
Neutro Abs: 11.6 10*3/uL — ABNORMAL HIGH (ref 1.7–7.7)
Neutrophils Relative %: 84 %
Platelets: 694 10*3/uL — ABNORMAL HIGH (ref 150–400)
RBC: 4.63 MIL/uL (ref 3.87–5.11)
RDW: 17.5 % — ABNORMAL HIGH (ref 11.5–15.5)
WBC: 13.7 10*3/uL — ABNORMAL HIGH (ref 4.0–10.5)
nRBC: 0 % (ref 0.0–0.2)

## 2022-06-29 LAB — ETHANOL: Alcohol, Ethyl (B): <10 mg/dL (ref <10)

## 2022-06-29 LAB — SALICYLATE LEVEL: Salicylate Lvl: <7 mg/dL — ABNORMAL LOW (ref 7.0–30.0)

## 2022-06-29 LAB — ACETAMINOPHEN LEVEL: Acetaminophen (Tylenol), Serum: <10 ug/mL — ABNORMAL LOW (ref 10–30)

## 2022-06-29 MED ORDER — ANAGRELIDE HCL 1 MG PO CAPS: 1.0000 mg | ORAL_CAPSULE | Freq: Two times a day (BID) | ORAL | Status: DC

## 2022-06-29 MED ORDER — PREGABALIN 75 MG PO CAPS
150.0000 mg | ORAL_CAPSULE | Freq: Two times a day (BID) | ORAL | Status: DC
  Administered 2022-06-29 – 2022-06-30 (×2): 150 mg via ORAL
  Filled 2022-06-29 (×2): qty 2

## 2022-06-29 MED ORDER — PANTOPRAZOLE SODIUM 40 MG PO TBEC
40.0000 mg | DELAYED_RELEASE_TABLET | Freq: Every day | ORAL | Status: DC
  Administered 2022-06-29: 40 mg via ORAL
  Filled 2022-06-29: qty 1

## 2022-06-29 MED ORDER — PREDNISONE 50 MG PO TABS
50.0000 mg | ORAL_TABLET | Freq: Every day | ORAL | Status: DC
  Administered 2022-06-29 – 2022-06-30 (×2): 50 mg via ORAL
  Filled 2022-06-29 (×2): qty 1

## 2022-06-29 MED ORDER — LURASIDONE HCL 40 MG PO TABS
40.0000 mg | ORAL_TABLET | Freq: Every day | ORAL | Status: DC
  Administered 2022-06-30: 40 mg via ORAL
  Filled 2022-06-29: qty 1

## 2022-06-29 MED ORDER — ALBUTEROL SULFATE HFA 108 (90 BASE) MCG/ACT IN AERS: 1.0000 | INHALATION_SPRAY | RESPIRATORY_TRACT | Status: DC | PRN

## 2022-06-29 MED ORDER — FUROSEMIDE 40 MG PO TABS
20.0000 mg | ORAL_TABLET | Freq: Every day | ORAL | Status: DC
  Administered 2022-06-29: 20 mg via ORAL
  Filled 2022-06-29: qty 1

## 2022-06-29 MED ORDER — ALBUTEROL SULFATE HFA 108 (90 BASE) MCG/ACT IN AERS: 2.0000 | INHALATION_SPRAY | RESPIRATORY_TRACT | Status: DC | PRN

## 2022-06-29 MED ORDER — HYDROXYZINE HCL 25 MG PO TABS: 100.0000 mg | ORAL_TABLET | Freq: Four times a day (QID) | ORAL | Status: DC | PRN

## 2022-06-29 MED ORDER — FLUTICASONE FUROATE-VILANTEROL 200-25 MCG/ACT IN AEPB: 1.0000 | INHALATION_SPRAY | Freq: Every day | RESPIRATORY_TRACT | Status: DC

## 2022-06-29 MED ORDER — DULOXETINE HCL 30 MG PO CPEP
60.0000 mg | ORAL_CAPSULE | Freq: Every day | ORAL | Status: DC
  Administered 2022-06-29 – 2022-06-30 (×2): 60 mg via ORAL
  Filled 2022-06-29 (×2): qty 2

## 2022-06-29 MED ORDER — CITALOPRAM HYDROBROMIDE 20 MG PO TABS
20.0000 mg | ORAL_TABLET | Freq: Every day | ORAL | Status: DC
  Administered 2022-06-29: 20 mg via ORAL
  Filled 2022-06-29: qty 1

## 2022-06-29 MED ORDER — ALBUTEROL SULFATE (2.5 MG/3ML) 0.083% IN NEBU: 2.5000 mg | INHALATION_SOLUTION | Freq: Four times a day (QID) | RESPIRATORY_TRACT | Status: DC | PRN

## 2022-06-29 MED ORDER — AMLODIPINE BESYLATE 5 MG PO TABS
5.0000 mg | ORAL_TABLET | Freq: Every day | ORAL | Status: DC
  Administered 2022-06-29 – 2022-06-30 (×2): 5 mg via ORAL
  Filled 2022-06-29 (×2): qty 1

## 2022-06-29 MED ORDER — VITAMIN D 25 MCG (1000 UNIT) PO TABS
1000.0000 [IU] | ORAL_TABLET | Freq: Every day | ORAL | Status: DC
  Administered 2022-06-29 – 2022-06-30 (×2): 1000 [IU] via ORAL
  Filled 2022-06-29 (×3): qty 1

## 2022-06-29 MED ORDER — RISPERIDONE 1 MG PO TABS
0.5000 mg | ORAL_TABLET | Freq: Two times a day (BID) | ORAL | Status: DC
  Administered 2022-06-29: 0.5 mg via ORAL
  Filled 2022-06-29: qty 1

## 2022-06-29 NOTE — ED Notes (Signed)
Pt was given ice cream by sitter, this nurse walks in room, pt has eyes closed, informed sitter and pt that ice cream would be taken away until pt can complete TTS assessment and give urine sample- pt has already had dinner meal tray, crackers and peanut butter. Pt verbalized agreement to this plan. TTS counselor sent secure chat to re-attempt TTS as pt is awake now. TTS cart at bedside.

## 2022-06-29 NOTE — ED Provider Notes (Signed)
[facility] Provider Note   CSN: [id number] Arrival date & time: 06/29/22  [id number]     History Chief Complaint  Patient presents with   Suicidal   Addiction Problem    Kathryn [name] is a 52 y.o. female patient with history of suicidal ideations, bipolar disorder, hypertension, and depression who presents to the emergency department today for further evaluation of suicidal ideations that began roughly 2 days ago.  Upon chart review, patient was released from the hospital after psychiatric evaluation on 06/27/2022.  At that time she was not expressing any suicidal ideations or homicidal ideations.  According to the note, she had good follow-up in place.  Upon talking with her, she states that she did not have a ride to the facility to continue her outpatient psychiatric treatment.  This caused her to spiral and "relapse."  She denies any drug use.  Since that day, she has been having suicidal ideations without any specific plan.  She denies any somatic symptoms apart from lower back pain.  Denies fever and chills.  HPI     Home Medications Prior to Admission medications   Medication Sig Start Date End Date Taking? Authorizing Provider  albuterol (PROVENTIL) (2.5 MG/3ML) 0.083% nebulizer solution Take 3 mLs (2.5 mg total) by nebulization every 6 (six) hours as needed for wheezing or shortness of breath. 0000000   Delora Fuel, MD  albuterol (VENTOLIN HFA) 108 (90 Base) MCG/ACT inhaler Inhale 1-2 puffs into the lungs every 4 (four) hours as needed for wheezing or shortness of breath. 12/22/21   [provider]  albuterol (VENTOLIN HFA) 108 (90 Base) MCG/ACT inhaler Inhale 2 puffs into the lungs every 4 (four) hours as needed for wheezing or shortness of breath. 0000000   Delora Fuel, MD  amLODipine (NORVASC) 5 MG tablet Take 1 tablet (5 mg total) by mouth daily. 02/02/22   Lindell Spar, MD  anagrelide (AGRYLIN) 1 MG capsule Take 1 capsule  (1 mg total) by mouth 2 (two) times daily. 01/14/22   Larita Fife, MD  BREO ELLIPTA 200-25 MCG/ACT AEPB INHALE 1 PUFF EVERY DAY 03/06/22   Lindell Spar, MD  cholecalciferol (VITAMIN D3) 25 MCG (1000 UNIT) tablet Take 1,000 Units by mouth daily.    [provider]  citalopram (CELEXA) 20 MG tablet Take 20 mg by mouth daily. 04/12/22   [provider]  DULoxetine (CYMBALTA) 60 MG capsule Take 1 capsule (60 mg total) by mouth daily. 06/27/22 07/27/22  Lajean Saver, MD  furosemide (LASIX) 20 MG tablet Take 1 tablet (20 mg total) by mouth daily. 04/27/22   Mickie Hillier, PA-C  hydrOXYzine (VISTARIL) 25 MG capsule Take 4 capsules (100 mg total) by mouth every 6 (six) hours as needed. 06/27/22   Lajean Saver, MD  lubiprostone (AMITIZA) 24 MCG capsule Take 1 capsule (24 mcg total) by mouth 2 (two) times daily with a meal. Patient not taking: Reported on 04/27/2022 10/22/21   Nicholes Rough, NP  lurasidone (LATUDA) 40 MG TABS tablet Take 1 tablet (40 mg total) by mouth daily with breakfast. 06/27/22   Lajean Saver, MD  methocarbamol (ROBAXIN) 500 MG tablet Take 1 tablet (500 mg total) by mouth 2 (two) times daily. Patient not taking: Reported on 04/27/2022 02/02/22   Lindell Spar, MD  mometasone-formoterol [facility]) 100-5 MCG/ACT AERO Inhale 2 puffs into the lungs 2 (two) times daily. Patient not taking: Reported on 02/02/2022 01/14/22   Larita Fife, MD  omeprazole (PRILOSEC) 20 MG capsule Take 20 mg by mouth daily. 12/22/21   [provider]  pantoprazole (PROTONIX) 40 MG tablet Take 1 tablet (40 mg total) by mouth daily. 01/14/22   Larita Fife, MD  potassium chloride SA (KLOR-CON M) 20 MEQ tablet Take 1 tablet (20 mEq total) by mouth daily. TAKE (1) TABLET BY MOUTH ONCE A DAY. Strength: 20 mEq 12/16/21   Lindell Spar, MD  predniSONE (DELTASONE) 50 MG tablet Take 1 tablet (50 mg total) by mouth daily. 0000000   Delora Fuel, MD  pregabalin (LYRICA) 150 MG capsule Take 1 capsule  (150 mg total) by mouth 2 (two) times daily. 0000000   Delora Fuel, MD  risperiDONE (RISPERDAL) 0.5 MG tablet Take 0.5 mg by mouth 2 (two) times daily. 04/12/22   [provider]  traZODone (DESYREL) 100 MG tablet Take 100 mg by mouth at bedtime. 11/17/21   [provider]  Vitamin D, Ergocalciferol, (DRISDOL) 1.25 MG (50000 UNIT) CAPS capsule TAKE 1 CAPSULE THE SAME DAY EACH WEEK. Patient not taking: Reported on 04/27/2022 04/13/22   Lindell Spar, MD      Allergies    Fish allergy, Flexeril [cyclobenzaprine hcl], Ibuprofen, Shellfish allergy, Tylenol [acetaminophen], Ace inhibitors, Peanut allergen powder-dnfp, Tramadol, and Trazodone and nefazodone    Review of Systems   Review of Systems  All other systems reviewed and are negative.   Physical Exam Updated Vital Signs BP (!) 150/91 (BP Location: Left Arm)   Pulse (!) 102   Temp 98.2 F (36.8 C) (Oral)   Resp 20   Ht '4\' 11"'$  (1.499 m)   Wt 61.2 kg   LMP 06/07/2020   SpO2 93%   BMI 27.27 kg/m  Physical Exam Vitals and nursing note reviewed.  Constitutional:      General: She is not in acute distress.    Appearance: Normal appearance.  HENT:     Head: Normocephalic and atraumatic.  Eyes:     General:        Right eye: No discharge.        Left eye: No discharge.  Cardiovascular:     Comments: Regular rate and rhythm.  S1/S2 are distinct without any evidence of murmur, rubs, or gallops.  Radial pulses are 2+ bilaterally.  Dorsalis pedis pulses are 2+ bilaterally.  No evidence of pedal edema. Pulmonary:     Comments: Clear to auscultation bilaterally.  Normal effort.  No respiratory distress.  No evidence of wheezes, rales, or rhonchi heard throughout. Abdominal:     General: Abdomen is flat. Bowel sounds are normal. There is no distension.     Tenderness: There is no abdominal tenderness. There is no guarding or rebound.  Musculoskeletal:        General: Normal range of motion.     Cervical back: Neck  supple.  Skin:    General: Skin is warm and dry.     Findings: No rash.  Neurological:     General: No focal deficit present.     Mental Status: She is alert.  Psychiatric:        Mood and Affect: Mood normal.        Behavior: Behavior normal.     ED Results / Procedures / Treatments   Labs (all labs ordered are listed, but only abnormal results are displayed) Labs Reviewed  COMPREHENSIVE METABOLIC PANEL - Abnormal; Notable for the following components:      Result Value   Sodium 134 (*)  Glucose, Bld 141 (*)    Calcium 8.4 (*)    All other components within normal limits  CBC WITH DIFFERENTIAL/PLATELET - Abnormal; Notable for the following components:   WBC 13.7 (*)    RDW 17.5 (*)    Platelets 694 (*)    Neutro Abs 11.6 (*)    Abs Immature Granulocytes 0.14 (*)    All other components within normal limits  SALICYLATE LEVEL - Abnormal; Notable for the following components:   Salicylate Lvl <2.6 (*)    All other components within normal limits  ACETAMINOPHEN LEVEL - Abnormal; Notable for the following components:   Acetaminophen (Tylenol), Serum <10 (*)    All other components within normal limits  ETHANOL  RAPID URINE DRUG SCREEN, HOSP PERFORMED  POC URINE PREG, ED    EKG None  Radiology No results found.  Procedures Procedures    Medications Ordered in ED Medications - No data to display  ED Course/ Medical Decision Making/ A&P Clinical Course as of 06/29/22 1422  Thu Jun 29, 2022  1415 Comprehensive metabolic panel(!) Mild hyponatremia with an elevated glucose.  No other significant abnormalities. [CF]  8341 Salicylate level(!) Negative. [CF]  1421 Ethanol Negative. [CF]  1422 Acetaminophen level(!) Negative. [CF]  9622 CBC with Diff(!) There is evidence of leukocytosis and thrombocytosis. [CF]    Clinical Course User Index [CF] Hendricks Limes, PA-C   {   Click here for ABCD2, HEART and other calculators  Medical Decision  Making Kathryn [name] is a 52 y.o. female patient who presents to the emergency department today for further evaluation of suicidal ideations.  Based on the previous note, sounds like she had poor follow-up.  I am considering whether or not this is just situational depression versus true suicidal ideations.  Nonetheless, we will plan to get medical clearance lab and have her speak with her psychiatric team for further evaluation and assessment since she is actively having suicidal ideations.  No clear signs of psychosis currently.  Not responding to any internal stimuli.  Patient is medically cleared.  Disposition to be made by oncoming provider pending psychiatric evaluation.  Amount and/or Complexity of Data Reviewed Labs: ordered. Decision-making details documented in ED Course.    Final Clinical Impression(s) / ED Diagnoses Final diagnoses:  None    Rx / DC Orders ED Discharge Orders     None         Hendricks Limes, [place] 06/29/22 1422    Milton Ferguson, MD 06/30/22 1814

## 2022-06-29 NOTE — ED Notes (Signed)
Pt ambulatory to bathroom with sitter and back to room -pt provided urine sample

## 2022-06-29 NOTE — ED Notes (Signed)
Pt dressed out in burgendy scrubs; pts belongings (shirt, bra, pants, sweatshirt, shoes, socks, lotion, and pts medicine from home) put into pt belongings bag and put into locker; pt comfortable in bed with sitter outside of room

## 2022-06-29 NOTE — BH Assessment (Signed)
@  2029, Clinician requested patient's nurse Baldo Ash, RN) to set up the TTS machine for patient's initial TTS assessment.

## 2022-06-29 NOTE — ED Notes (Signed)
TTS cart in room- pt ready for consult

## 2022-06-29 NOTE — ED Triage Notes (Signed)
Pt reports she went to a party last night and "relapsed". Pt reports she used cocaine last night and whenever she starts using it she starts to feel suicidal. Pt reports her plan was to drink Clorox and OD. Pt reports she "is just so tired". Pt reports she stays overnight with her daughter, but then when her daughter goes to work in the morning she makes her leave while she is away and then she is allowed back in the evening when she is home.

## 2022-06-29 NOTE — ED Notes (Signed)
Attempted to call [facility] counselor- unable to get anyone on phone [facility] called- no answer Have not received response from secure message that was sent at [phone]. Vivien Rota, RN charge nurse aware of this.

## 2022-06-29 NOTE — ED Notes (Addendum)
Pt says she was unable to "catch urine in cup"-pt also reports having BM-informed pt next time we will put a hat in the toilet if she can't urinate in cup for whatever reason. Pt said ok

## 2022-06-29 NOTE — ED Notes (Signed)
Sitter put pts beanie and bandana in pt belongings bag in locker with other items

## 2022-06-29 NOTE — BH Assessment (Addendum)
@  2110, Clinician logged on to complete patient's initial TTS via tele assessment. However, patient fell asleep when asked to confirm her first and last name. Also, her DOB. Clinician attempted to wake patient up by calling her name 3+ times. Patient was not responsive to her name being called. Therefore, this Clinician ended the TTS assessment and notified her nurse Baldo Ash, [place]) of the attempts to complete patient's initial TTS assessment. Clinician requested patient's nurse to notify TTS when patient is alert and able to participate a TTS assessment.   PM TTS staff, provided updates.

## 2022-06-29 NOTE — ED Notes (Signed)
[facility] counselor said pt kept falling asleep, unable to complete TTS at this time, will try back later.

## 2022-06-30 ENCOUNTER — Other Ambulatory Visit: Payer: Self-pay

## 2022-06-30 ENCOUNTER — Encounter (HOSPITAL_COMMUNITY): Payer: Self-pay | Admitting: Family

## 2022-06-30 ENCOUNTER — Inpatient Hospital Stay (HOSPITAL_COMMUNITY)
Admission: AD | Admit: 2022-06-30 | Discharge: 2022-07-10 | DRG: 885 | Disposition: A | Discharge status: Home / Self Care | Payer: Medicare HMO | Source: Intra-hospital | Attending: Psychiatry | Admitting: Psychiatry

## 2022-06-30 DIAGNOSIS — R45851 Suicidal ideations: Secondary | ICD-10-CM | POA: Diagnosis not present

## 2022-06-30 DIAGNOSIS — M549 Dorsalgia, unspecified: Secondary | ICD-10-CM | POA: Diagnosis present

## 2022-06-30 DIAGNOSIS — F17210 Nicotine dependence, cigarettes, uncomplicated: Secondary | ICD-10-CM | POA: Diagnosis present

## 2022-06-30 DIAGNOSIS — G8929 Other chronic pain: Secondary | ICD-10-CM | POA: Diagnosis present

## 2022-06-30 DIAGNOSIS — E559 Vitamin D deficiency, unspecified: Secondary | ICD-10-CM | POA: Diagnosis present

## 2022-06-30 DIAGNOSIS — D649 Anemia, unspecified: Secondary | ICD-10-CM | POA: Diagnosis not present

## 2022-06-30 DIAGNOSIS — R1030 Lower abdominal pain, unspecified: Principal | ICD-10-CM

## 2022-06-30 DIAGNOSIS — I10 Essential (primary) hypertension: Secondary | ICD-10-CM

## 2022-06-30 DIAGNOSIS — Z9851 Tubal ligation status: Secondary | ICD-10-CM

## 2022-06-30 DIAGNOSIS — D75839 Thrombocytosis, unspecified: Secondary | ICD-10-CM | POA: Diagnosis not present

## 2022-06-30 DIAGNOSIS — Z8249 Family history of ischemic heart disease and other diseases of the circulatory system: Secondary | ICD-10-CM | POA: Diagnosis not present

## 2022-06-30 DIAGNOSIS — R112 Nausea with vomiting, unspecified: Secondary | ICD-10-CM | POA: Diagnosis not present

## 2022-06-30 DIAGNOSIS — F411 Generalized anxiety disorder: Secondary | ICD-10-CM | POA: Diagnosis not present

## 2022-06-30 DIAGNOSIS — R109 Unspecified abdominal pain: Secondary | ICD-10-CM | POA: Diagnosis not present

## 2022-06-30 DIAGNOSIS — Z886 Allergy status to analgesic agent status: Secondary | ICD-10-CM

## 2022-06-30 DIAGNOSIS — R1084 Generalized abdominal pain: Secondary | ICD-10-CM | POA: Diagnosis not present

## 2022-06-30 DIAGNOSIS — F1994 Other psychoactive substance use, unspecified with psychoactive substance-induced mood disorder: Secondary | ICD-10-CM | POA: Diagnosis present

## 2022-06-30 DIAGNOSIS — F1499 Cocaine use, unspecified with unspecified cocaine-induced disorder: Secondary | ICD-10-CM | POA: Diagnosis not present

## 2022-06-30 DIAGNOSIS — Z91010 Allergy to peanuts: Secondary | ICD-10-CM

## 2022-06-30 DIAGNOSIS — F1414 Cocaine abuse with cocaine-induced mood disorder: Secondary | ICD-10-CM | POA: Diagnosis not present

## 2022-06-30 DIAGNOSIS — F1410 Cocaine abuse, uncomplicated: Secondary | ICD-10-CM | POA: Diagnosis not present

## 2022-06-30 DIAGNOSIS — Z825 Family history of asthma and other chronic lower respiratory diseases: Secondary | ICD-10-CM

## 2022-06-30 DIAGNOSIS — F29 Unspecified psychosis not due to a substance or known physiological condition: Secondary | ICD-10-CM | POA: Diagnosis not present

## 2022-06-30 DIAGNOSIS — R1032 Left lower quadrant pain: Secondary | ICD-10-CM | POA: Diagnosis not present

## 2022-06-30 DIAGNOSIS — Z7951 Long term (current) use of inhaled steroids: Secondary | ICD-10-CM | POA: Diagnosis not present

## 2022-06-30 DIAGNOSIS — F250 Schizoaffective disorder, bipolar type: Principal | ICD-10-CM | POA: Diagnosis present

## 2022-06-30 DIAGNOSIS — F209 Schizophrenia, unspecified: Principal | ICD-10-CM | POA: Diagnosis present

## 2022-06-30 DIAGNOSIS — Z8616 Personal history of COVID-19: Secondary | ICD-10-CM

## 2022-06-30 DIAGNOSIS — Z62810 Personal history of physical and sexual abuse in childhood: Secondary | ICD-10-CM | POA: Diagnosis not present

## 2022-06-30 DIAGNOSIS — Z5982 Transportation insecurity: Secondary | ICD-10-CM

## 2022-06-30 DIAGNOSIS — Z79899 Other long term (current) drug therapy: Secondary | ICD-10-CM | POA: Diagnosis not present

## 2022-06-30 DIAGNOSIS — Z5900 Homelessness unspecified: Secondary | ICD-10-CM

## 2022-06-30 DIAGNOSIS — Z91013 Allergy to seafood: Secondary | ICD-10-CM

## 2022-06-30 DIAGNOSIS — G4730 Sleep apnea, unspecified: Secondary | ICD-10-CM | POA: Diagnosis present

## 2022-06-30 DIAGNOSIS — I700 Atherosclerosis of aorta: Secondary | ICD-10-CM | POA: Diagnosis not present

## 2022-06-30 DIAGNOSIS — Z888 Allergy status to other drugs, medicaments and biological substances status: Secondary | ICD-10-CM | POA: Diagnosis not present

## 2022-06-30 DIAGNOSIS — F1494 Cocaine use, unspecified with cocaine-induced mood disorder: Secondary | ICD-10-CM | POA: Diagnosis present

## 2022-06-30 DIAGNOSIS — K219 Gastro-esophageal reflux disease without esophagitis: Secondary | ICD-10-CM | POA: Diagnosis present

## 2022-06-30 DIAGNOSIS — F332 Major depressive disorder, recurrent severe without psychotic features: Secondary | ICD-10-CM | POA: Diagnosis present

## 2022-06-30 DIAGNOSIS — Z5941 Food insecurity: Secondary | ICD-10-CM

## 2022-06-30 DIAGNOSIS — R739 Hyperglycemia, unspecified: Secondary | ICD-10-CM | POA: Diagnosis not present

## 2022-06-30 DIAGNOSIS — F200 Paranoid schizophrenia: Secondary | ICD-10-CM

## 2022-06-30 DIAGNOSIS — Z7952 Long term (current) use of systemic steroids: Secondary | ICD-10-CM | POA: Diagnosis not present

## 2022-06-30 DIAGNOSIS — J449 Chronic obstructive pulmonary disease, unspecified: Secondary | ICD-10-CM | POA: Diagnosis present

## 2022-06-30 DIAGNOSIS — D72829 Elevated white blood cell count, unspecified: Secondary | ICD-10-CM | POA: Diagnosis not present

## 2022-06-30 DIAGNOSIS — E871 Hypo-osmolality and hyponatremia: Secondary | ICD-10-CM | POA: Diagnosis not present

## 2022-06-30 LAB — RAPID URINE DRUG SCREEN, HOSP PERFORMED
Amphetamines: NOT DETECTED
Barbiturates: NOT DETECTED
Benzodiazepines: NOT DETECTED
Cocaine: POSITIVE — AB
Opiates: NOT DETECTED
Tetrahydrocannabinol: NOT DETECTED

## 2022-06-30 MED ORDER — HYDROXYZINE HCL 50 MG PO TABS
100.0000 mg | ORAL_TABLET | Freq: Four times a day (QID) | ORAL | Status: DC | PRN
  Administered 2022-07-01 – 2022-07-09 (×9): 100 mg via ORAL
  Filled 2022-06-30 (×9): qty 2

## 2022-06-30 MED ORDER — DULOXETINE HCL 60 MG PO CPEP
60.0000 mg | ORAL_CAPSULE | Freq: Every day | ORAL | Status: DC
  Administered 2022-07-01 – 2022-07-10 (×10): 60 mg via ORAL
  Filled 2022-06-30 (×12): qty 1

## 2022-06-30 MED ORDER — HALOPERIDOL LACTATE 5 MG/ML IJ SOLN: 5.0000 mg | Freq: Three times a day (TID) | INTRAMUSCULAR | Status: DC | PRN

## 2022-06-30 MED ORDER — FLUTICASONE FUROATE-VILANTEROL 200-25 MCG/ACT IN AEPB
1.0000 | INHALATION_SPRAY | Freq: Every day | RESPIRATORY_TRACT | Status: DC
  Administered 2022-07-01 – 2022-07-10 (×9): 1 via RESPIRATORY_TRACT
  Filled 2022-06-30: qty 28

## 2022-06-30 MED ORDER — ALUM & MAG HYDROXIDE-SIMETH 200-200-20 MG/5ML PO SUSP
30.0000 mL | ORAL | Status: DC | PRN
  Administered 2022-07-02: 30 mL via ORAL
  Filled 2022-06-30: qty 30

## 2022-06-30 MED ORDER — LIDOCAINE 5 % EX PTCH
1.0000 | MEDICATED_PATCH | CUTANEOUS | Status: DC
  Administered 2022-06-30: 1 via TRANSDERMAL
  Filled 2022-06-30: qty 1

## 2022-06-30 MED ORDER — AMLODIPINE BESYLATE 5 MG PO TABS
5.0000 mg | ORAL_TABLET | Freq: Every day | ORAL | Status: DC
  Administered 2022-07-01 – 2022-07-10 (×10): 5 mg via ORAL
  Filled 2022-06-30 (×13): qty 1

## 2022-06-30 MED ORDER — LIDOCAINE 5 % EX PTCH
1.0000 | MEDICATED_PATCH | CUTANEOUS | Status: DC
  Administered 2022-07-01: 1 via TRANSDERMAL
  Filled 2022-06-30 (×3): qty 1

## 2022-06-30 MED ORDER — PREDNISONE 50 MG PO TABS
50.0000 mg | ORAL_TABLET | Freq: Every day | ORAL | Status: DC
  Administered 2022-07-01: 50 mg via ORAL
  Filled 2022-06-30 (×3): qty 1

## 2022-06-30 MED ORDER — ALBUTEROL SULFATE (2.5 MG/3ML) 0.083% IN NEBU: 2.5000 mg | INHALATION_SOLUTION | Freq: Four times a day (QID) | RESPIRATORY_TRACT | Status: DC | PRN

## 2022-06-30 MED ORDER — HYDROXYZINE PAMOATE 50 MG PO CAPS
100.0000 mg | ORAL_CAPSULE | Freq: Four times a day (QID) | ORAL | Status: DC | PRN
  Filled 2022-06-30: qty 2

## 2022-06-30 MED ORDER — DIPHENHYDRAMINE HCL 25 MG PO CAPS: 50.0000 mg | ORAL_CAPSULE | Freq: Three times a day (TID) | ORAL | Status: DC | PRN

## 2022-06-30 MED ORDER — VITAMIN D 25 MCG (1000 UNIT) PO TABS
1000.0000 [IU] | ORAL_TABLET | Freq: Every day | ORAL | Status: DC
  Administered 2022-07-01 – 2022-07-10 (×10): 1000 [IU] via ORAL
  Filled 2022-06-30 (×12): qty 1

## 2022-06-30 MED ORDER — DIPHENHYDRAMINE HCL 50 MG/ML IJ SOLN: 50.0000 mg | Freq: Three times a day (TID) | INTRAMUSCULAR | Status: DC | PRN

## 2022-06-30 MED ORDER — LORAZEPAM 1 MG PO TABS: 2.0000 mg | ORAL_TABLET | Freq: Three times a day (TID) | ORAL | Status: DC | PRN

## 2022-06-30 MED ORDER — LORAZEPAM 2 MG/ML IJ SOLN: 2.0000 mg | Freq: Three times a day (TID) | INTRAMUSCULAR | Status: DC | PRN

## 2022-06-30 MED ORDER — ASPIRIN 325 MG PO TABS
650.0000 mg | ORAL_TABLET | Freq: Once | ORAL | Status: DC
  Filled 2022-06-30: qty 2

## 2022-06-30 MED ORDER — PREGABALIN 75 MG PO CAPS
150.0000 mg | ORAL_CAPSULE | Freq: Two times a day (BID) | ORAL | Status: DC
  Administered 2022-07-01 – 2022-07-10 (×19): 150 mg via ORAL
  Filled 2022-06-30 (×19): qty 2

## 2022-06-30 MED ORDER — HALOPERIDOL 5 MG PO TABS: 5.0000 mg | ORAL_TABLET | Freq: Three times a day (TID) | ORAL | Status: DC | PRN

## 2022-06-30 MED ORDER — MAGNESIUM HYDROXIDE 400 MG/5ML PO SUSP
30.0000 mL | Freq: Every day | ORAL | Status: DC | PRN
  Administered 2022-07-02: 30 mL via ORAL
  Filled 2022-06-30: qty 30

## 2022-06-30 MED ORDER — LURASIDONE HCL 40 MG PO TABS
40.0000 mg | ORAL_TABLET | Freq: Every day | ORAL | Status: DC
  Administered 2022-07-01 – 2022-07-03 (×3): 40 mg via ORAL
  Filled 2022-06-30 (×5): qty 1

## 2022-06-30 NOTE — ED Notes (Signed)
Patient accepted to [facility] room 307-1 can arrive after 1330 per TTS.

## 2022-06-30 NOTE — ED Notes (Signed)
Patient signed voluntary consent at this time and faxed to [facility].

## 2022-06-30 NOTE — ED Notes (Signed)
pt is recommendd for inpatient care by Erasmo Score, NP. CSW to assist with placement. Dr Betsey Holiday made aware.

## 2022-06-30 NOTE — Progress Notes (Signed)
Pt provided with walker due to chronic lower back pain and associated weakness. Verbal order provided for walker from Dr. Renard Hamper, MD.

## 2022-06-30 NOTE — ED Provider Notes (Signed)
Emergency Medicine Observation Re-evaluation Note  Kathryn [name] is a 52 y.o. female, seen on rounds today.  Pt initially presented to the ED for complaints of Suicidal and Addiction Problem Currently, the patient is awaiting placement.  Physical Exam  BP (!) 144/91 (BP Location: Right Arm)   Pulse 60   Temp 98.4 F (36.9 C) (Oral)   Resp 16   Ht 4\' 11"  (1.499 m)   Wt 61.2 kg   LMP 06/07/2020   SpO2 99%   BMI 27.27 kg/m  Physical Exam General: alert in nad   ED Course / MDM  EKG:   I have reviewed the labs performed to date as well as medications administered while in observation.  Recent changes in the last 24 hours include none.  Plan  Current plan is for psyc placement.    Milton Ferguson, MD 06/30/22 [phone]

## 2022-06-30 NOTE — Progress Notes (Signed)
Patient complaints of lower back pain, patient rated her pain 10/10, 10 being the worst. On- call provider contacted via secure chat and updated. May provide warm compress since patient is allergic to both Tylenol and ibuprofen per Leandro Reasoner, NP.
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
## 2022-06-30 NOTE — ED Notes (Signed)
Per Lynn Ito at [facility] patient can be transported w/o a new covid swab. Safe transport called.
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
## 2022-06-30 NOTE — ED Notes (Addendum)
Patient taken out to safe transport. Belongings sent with patient from the [facility] locker hold.

## 2022-06-30 NOTE — ED Notes (Signed)
Patient updated that a bed is ready for her at [facility].

## 2022-06-30 NOTE — ED Notes (Signed)
TTS being completed at this time. 

## 2022-06-30 NOTE — ED Notes (Signed)
Report given to [facility], awaiting a phone call back to see if patient will need a covid test before transfer. Last result was negative on 06/27/22.

## 2022-06-30 NOTE — [facility] Group Notes (Signed)
[place] Group Notes:  (Nursing/MHT/Case Management/Adjunct)  Date:  06/30/2022  Time:  8:20 PM  Type of Therapy:   AA  Participation Level:  Did Not Attend  Participation Quality:   na  Affect:   na  Cognitive:   na  Insight:  None  Engagement in Group:   na  Modes of Intervention:   na  Summary of Progress/Problems:  Orvan Falconer 06/30/2022, 8:20 PM

## 2022-06-30 NOTE — Tx Team (Signed)
Initial Treatment Plan 06/30/2022 6:11 PM [name] [id number]    PATIENT STRESSORS: Financial difficulties   Legal issue   Medication change or noncompliance   Substance abuse     PATIENT STRENGTHS: Supportive family/friends    PATIENT IDENTIFIED PROBLEMS: Substance Abuse  Financial difficulties  Legal issue  Medication noncompliance               DISCHARGE CRITERIA:  Improved stabilization in mood, thinking, and/or behavior Verbal commitment to aftercare and medication compliance  PRELIMINARY DISCHARGE PLAN: Attend aftercare/continuing care group Placement in alternative living arrangements  PATIENT/FAMILY INVOLVEMENT: This treatment plan has been presented to and reviewed with the patient, Kathryn [name].The patient has been given the opportunity to ask questions and make suggestions.  Melinda Crutch, RN 06/30/2022, 6:11 PM

## 2022-06-30 NOTE — ED Notes (Signed)
Pt given diet gingerale and peanut butter crackers per request. Pt attempting to given urine sample at this time- bedside toilet brought to pt room- sitter present.

## 2022-06-30 NOTE — Progress Notes (Signed)
Pt was accepted to [facility] 06/30/2022. Bed assignment: 307-1  Pt meets inpatient criteria per Ricky Ala, NP  Attending Physician will be Janine Limbo, MD  Report can be called to: - Adult unit: [id number]  Pt can arrive after 1:30 PM  Care Team Notified: [facility], RN, Kara Dies, RN, Ricky Ala, NP, Noralyn Pick, RN, Idelia Salm, RN, and Tawni Millers, NT  [place], [place]  06/30/2022 12:23 PM

## 2022-06-30 NOTE — Consult Note (Signed)
Telepsych Consultation   Reason for Consult:  Suicidal Ideations Referring Physician:  Roderic Palau, MD  Location of Patient: [phone] Location of Provider: Other: [facility]  Patient Identification: Kathryn [name] MRN:  [id number] Principal Diagnosis: Suicidal ideation Diagnosis:  Principal Problem:   Suicidal ideation   Total Time spent with patient: 15 minutes  Subjective:  Kathryn [name] is a 52 y.o. female who is seen and evaluated via teleassessment.  She continues to endorse suicidal ideation with a plan to drink Clorox bleach and/or drink herself to death.  She reports last using cocaine last Wednesday.  Reports her main stressors is related to homelessness.  Reports she was previously inpatient this past December.  And is seeking additional outpatient resources for substance abuse treatment.  She denies that she is currently followed by therapy or psychiatry.  Denies that she is taking any psychotropic medication.  Does report auditory  and visual hallucinations.  Denied command hallucinations. Stated " I saw a man in the room crawling on the wall."   Patient continues to be recommend for inpatient admission and is under review at [facility].   HPI: Per admission assessment note: " Pt was brought in by her niece.  Pt has been having SI for the last 3 days.  She says that stress and depression combined to make her relapse on cocaine and ETOH.  She drank two cups of liquor and three 40's on Wednesday night (03/13).  She also used smoked a 8-ball.  She said she had been sober for 2 months.  Patient says she feels like killing herself by drinking clorox and smoking a lot of crack.  Pt had one previous attempt a year ago.  Pt had cut her wrists at that time.  She went to [facility] then.  Patient denies any HI.  She does hear voices telling her to kill herself.  Patient does not have access to guns.  She has no outpt care now.  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   Single subsegmental pulmonary embolism without acute cor pulmonale ([place]) 06/20/2021   Sleep apnea 04/2020   GETTING A cpap   Suicidal ideation 12/24/2018    Past Surgical History:  Procedure Laterality Date   BACK SURGERY     DILATATION AND CURETTAGE/HYSTEROSCOPY WITH MINERVA N/A 06/09/2020   Procedure: DILATATION AND CURETTAGE/HYSTEROSCOPY WITH MINERVA;  Surgeon: Florian Buff, MD;  Location: [facility];  Service: Gynecology;  Laterality: N/A;   ESOPHAGOGASTRODUODENOSCOPY (EGD) WITH PROPOFOL N/A 12/25/2018   Procedure: ESOPHAGOGASTRODUODENOSCOPY (EGD) WITH PROPOFOL;  Surgeon: Rogene Houston, MD;  Location: [facility];  Service: Endoscopy;  Laterality: N/A;   FLEXIBLE SIGMOIDOSCOPY  10/14/2021   Procedure: FLEXIBLE SIGMOIDOSCOPY;  Surgeon: Harvel Quale, MD;  Location: [facility];  Service:  Gastroenterology;;   PATELLA-FEMORAL ARTHROPLASTY Left 10/08/2018   Procedure: PATELLA-FEMORAL ARTHROPLASTY;  Surgeon: Marchia Bond, MD;  Location: [facility];  Service: Orthopedics;  Laterality: Left;   TUBAL LIGATION     Family History:  Family History  Problem Relation Age of Onset   Gout Paternal Grandfather    Cirrhosis Paternal Grandfather    Hypertension Paternal Grandmother    Aneurysm Paternal Grandmother    Cirrhosis Maternal Grandmother    Cirrhosis Maternal Grandfather    Cancer Father    Cirrhosis Father    Cirrhosis Mother     Breast cancer Sister    Hypertension Sister    Bronchitis Daughter    Bronchitis Daughter    Asthma Son    Bronchitis Son    Migraines Neg Hx    Family Psychiatric  History:  Social History:  Social History   Substance and Sexual Activity  Alcohol Use Not Currently   Comment: started drinking again last night while at a party as of 06/29/22     Social History   Substance and Sexual Activity  Drug Use Yes   Types: Cocaine    Social History   Socioeconomic History   Marital status: Single    Spouse name: Not on file   Number of children: 3   Years of education: 10   Highest education level: 10th grade  Occupational History   Not on file  Tobacco Use   Smoking status: Every Day    Packs/day: 0.25    Years: 10.00    Additional pack years: 0.00    Total pack years: 2.50    Types: Cigarettes    Last attempt to quit: 04/2020    Years since quitting: 2.2    Passive exposure: Never   Smokeless tobacco: Never   Tobacco comments:    Smokes often, especially when using crack cocaine  Vaping Use   Vaping Use: Never used  Substance and Sexual Activity   Alcohol use: Not Currently    Comment: started drinking again last night while at a party as of 06/29/22   Drug use: Yes    Types: Cocaine   Sexual activity: Yes    Birth control/protection: Surgical    Comment: tubal, ablation  Other Topics Concern   Not on file  Social History Narrative   R handed    Lives with boyfriend   1 Cup of caffeine daily    Social Determinants of Health   Financial Resource Strain: Not on file  Food Insecurity: Food Insecurity Present (01/08/2022)   Hunger Vital Sign    Worried About Running Out of Food in the Last Year: Sometimes true    Ran Out of Food in the Last Year: Sometimes true  Transportation Needs: No Transportation Needs (01/08/2022)   PRAPARE - Hydrologist (Medical): No    Lack of Transportation (Non-Medical): No  Physical Activity: Not on file   Stress: Not on file  Social Connections: Not on file   Additional Social History:    Allergies:   Allergies  Allergen Reactions   Fish Allergy Anaphylaxis, Shortness Of Breath and Swelling   Flexeril [Cyclobenzaprine Hcl] Shortness Of Breath   Ibuprofen Anaphylaxis, Hives and Other (See Comments)   Shellfish Allergy Anaphylaxis   Tylenol [Acetaminophen] Anaphylaxis   Ace Inhibitors Cough   Peanut Allergen Powder-Dnfp    Tramadol Nausea And Vomiting    Upset stomach   Trazodone And Nefazodone Hives    Labs:  Results  for orders placed or performed during the hospital encounter of 06/29/22 (from the past 48 hour(s))  Comprehensive metabolic panel     Status: Abnormal   Collection Time: 06/29/22  9:59 AM  Result Value Ref Range   Sodium 134 (L) 135 - 145 mmol/L   Potassium 3.7 3.5 - 5.1 mmol/L   Chloride 102 98 - 111 mmol/L   CO2 22 22 - 32 mmol/L   Glucose, Bld 141 (H) 70 - 99 mg/dL    Comment: Glucose reference range applies only to samples taken after fasting for at least 8 hours.   BUN 12 6 - 20 mg/dL   Creatinine, Ser 0.70 0.44 - 1.00 mg/dL   Calcium 8.4 (L) 8.9 - 10.3 mg/dL   Total Protein 7.1 6.5 - 8.1 g/dL   Albumin 3.6 3.5 - 5.0 g/dL   AST 23 15 - 41 U/L   ALT 23 0 - 44 U/L   Alkaline Phosphatase 77 38 - 126 U/L   Total Bilirubin 0.4 0.3 - 1.2 mg/dL   GFR, Estimated >60 >60 mL/min    Comment: (NOTE) Calculated using the CKD-EPI Creatinine Equation (2021)    Anion gap 10 5 - 15    Comment: Performed at [facility], [address], [place], [place] [address]  Ethanol     Status: None   Collection Time: 06/29/22  9:59 AM  Result Value Ref Range   Alcohol, Ethyl (B) <10 <10 mg/dL    Comment: (NOTE) Lowest detectable limit for serum alcohol is 10 mg/dL.  For medical purposes only. Performed at [facility], [address], [place], [place] [address]   CBC with Diff     Status: Abnormal   Collection Time: 06/29/22  9:59 AM  Result Value Ref Range   WBC 13.7  (H) 4.0 - 10.5 K/uL   RBC 4.63 3.87 - 5.11 MIL/uL   Hemoglobin 12.7 12.0 - 15.0 g/dL   HCT 40.2 36.0 - 46.0 %   MCV 86.8 80.0 - 100.0 fL   MCH 27.4 26.0 - 34.0 pg   MCHC 31.6 30.0 - 36.0 g/dL   RDW 17.5 (H) 11.5 - 15.5 %   Platelets 694 (H) 150 - 400 K/uL   nRBC 0.0 0.0 - 0.2 %   Neutrophils Relative % 84 %   Neutro Abs 11.6 (H) 1.7 - 7.7 K/uL   Lymphocytes Relative 11 %   Lymphs Abs 1.5 0.7 - 4.0 K/uL   Monocytes Relative 3 %   Monocytes Absolute 0.4 0.1 - 1.0 K/uL   Eosinophils Relative 1 %   Eosinophils Absolute 0.1 0.0 - 0.5 K/uL   Basophils Relative 0 %   Basophils Absolute 0.1 0.0 - 0.1 K/uL   Immature Granulocytes 1 %   Abs Immature Granulocytes 0.14 (H) 0.00 - 0.07 K/uL    Comment: Performed at [facility], [address], [place], [place] [id number]  Salicylate level     Status: Abnormal   Collection Time: 06/29/22  9:59 AM  Result Value Ref Range   Salicylate Lvl [id number] (L) 7.0 - 30.0 mg/dL    Comment: Performed at [facility], [address], [place], [place] [address]  Acetaminophen level     Status: Abnormal   Collection Time: 06/29/22  9:59 AM  Result Value Ref Range   Acetaminophen (Tylenol), Serum <10 (L) 10 - 30 ug/mL    Comment: (NOTE) Therapeutic concentrations vary significantly. A range of 10-30 ug/mL  may be an effective concentration for many patients.  However, some  are best treated at concentrations outside of this range. Acetaminophen concentrations >150 ug/mL at 4 hours after ingestion  and >50 ug/mL at 12 hours after ingestion are often associated with  toxic reactions.  Performed at [facility], [address] [place], [place] [address]   Urine rapid drug screen (hosp performed)     Status: Abnormal   Collection Time: 06/30/22  1:36 AM  Result Value Ref Range   Opiates NONE DETECTED NONE DETECTED   Cocaine POSITIVE (A) NONE DETECTED   Benzodiazepines NONE DETECTED NONE DETECTED   Amphetamines NONE DETECTED NONE DETECTED    Tetrahydrocannabinol NONE DETECTED NONE DETECTED   Barbiturates NONE DETECTED NONE DETECTED    Comment: (NOTE) DRUG SCREEN FOR MEDICAL PURPOSES ONLY.  IF CONFIRMATION IS NEEDED FOR ANY PURPOSE, NOTIFY LAB WITHIN 5 DAYS.  LOWEST DETECTABLE LIMITS FOR URINE DRUG SCREEN Drug Class                     Cutoff (ng/mL) Amphetamine and metabolites    1000 Barbiturate and metabolites    200 Benzodiazepine                 200 Opiates and metabolites        300 Cocaine and metabolites        300 THC                            50 Performed at [facility], [address], [place], [place] [address]     Medications:  Current Facility-Administered Medications  Medication Dose Route Frequency Provider Last Rate Last Admin   albuterol (PROVENTIL) (2.5 MG/3ML) 0.083% nebulizer solution 2.5 mg  2.5 mg Nebulization Q6H PRN Myna Bright M, PA-C       amLODipine (NORVASC) tablet 5 mg  5 mg Oral Daily Myna Bright M, PA-C   5 mg at 06/30/22 0920   aspirin tablet 650 mg  650 mg Oral Once Milton Ferguson, MD       cholecalciferol (VITAMIN D3) 25 MCG (1000 UNIT) tablet 1,000 Units  1,000 Units Oral Daily Myna Bright M, PA-C   1,000 Units at 06/30/22 0920   DULoxetine (CYMBALTA) DR capsule 60 mg  60 mg Oral Daily Myna Bright M, PA-C   60 mg at 06/30/22 0920   fluticasone furoate-vilanterol (BREO ELLIPTA) 200-25 MCG/ACT 1 puff  1 puff Inhalation Daily Myna Bright M, PA-C       hydrOXYzine (ATARAX) tablet 100 mg  100 mg Oral Q6H PRN Myna Bright M, PA-C       lidocaine (LIDODERM) 5 % 1 patch  1 patch Transdermal Q24H Milton Ferguson, MD       lurasidone (LATUDA) tablet 40 mg  40 mg Oral Q breakfast Myna Bright M, PA-C   40 mg at 06/30/22 0724   predniSONE (DELTASONE) tablet 50 mg  50 mg Oral Daily Myna Bright M, PA-C   50 mg at 06/30/22 0920   pregabalin (LYRICA) capsule 150 mg  150 mg Oral BID Myna Bright M, PA-C   150 mg at 06/30/22 [id number]   Current Outpatient Medications   Medication Sig Dispense Refill   amoxicillin-clavulanate (AUGMENTIN) 875-125 MG tablet Take 1 tablet by mouth 2 (two) times daily.     benzonatate (TESSALON) 200 MG capsule Take by mouth.     albuterol (PROVENTIL) (2.5 MG/3ML) 0.083% nebulizer solution Take 3 mLs (2.5 mg total) by nebulization every 6 (  six) hours as needed for wheezing or shortness of breath. 75 mL 12   albuterol (VENTOLIN HFA) 108 (90 Base) MCG/ACT inhaler Inhale 1-2 puffs into the lungs every 4 (four) hours as needed for wheezing or shortness of breath.     albuterol (VENTOLIN HFA) 108 (90 Base) MCG/ACT inhaler Inhale 2 puffs into the lungs every 4 (four) hours as needed for wheezing or shortness of breath. 1 each 1   amLODipine (NORVASC) 5 MG tablet Take 1 tablet (5 mg total) by mouth daily. 90 tablet 1   anagrelide (AGRYLIN) 1 MG capsule Take 1 capsule (1 mg total) by mouth 2 (two) times daily. 60 capsule 0   BREO ELLIPTA 200-25 MCG/ACT AEPB INHALE 1 PUFF EVERY DAY 180 each 10   cholecalciferol (VITAMIN D3) 25 MCG (1000 UNIT) tablet Take 1,000 Units by mouth daily.     citalopram (CELEXA) 20 MG tablet Take 20 mg by mouth daily.     DULoxetine (CYMBALTA) 60 MG capsule Take 1 capsule (60 mg total) by mouth daily. 30 capsule 0   furosemide (LASIX) 20 MG tablet Take 1 tablet (20 mg total) by mouth daily. 30 tablet 10   hydrOXYzine (VISTARIL) 25 MG capsule Take 4 capsules (100 mg total) by mouth every 6 (six) hours as needed. 15 capsule 0   lubiprostone (AMITIZA) 24 MCG capsule Take 1 capsule (24 mcg total) by mouth 2 (two) times daily with a meal. (Patient not taking: Reported on 04/27/2022) 60 capsule 0   lurasidone (LATUDA) 40 MG TABS tablet Take 1 tablet (40 mg total) by mouth daily with breakfast. 30 tablet 0   methocarbamol (ROBAXIN) 500 MG tablet Take 1 tablet (500 mg total) by mouth 2 (two) times daily. (Patient not taking: Reported on 04/27/2022) 60 tablet 2   mometasone-formoterol (DULERA) 100-5 MCG/ACT AERO Inhale 2 puffs  into the lungs 2 (two) times daily. (Patient not taking: Reported on 02/02/2022) 1 each 0   omeprazole (PRILOSEC) 20 MG capsule Take 20 mg by mouth daily.     pantoprazole (PROTONIX) 40 MG tablet Take 1 tablet (40 mg total) by mouth daily. 30 tablet 0   potassium chloride SA (KLOR-CON M) 20 MEQ tablet Take 1 tablet (20 mEq total) by mouth daily. TAKE (1) TABLET BY MOUTH ONCE A DAY. Strength: 20 mEq 30 tablet 0   predniSONE (DELTASONE) 50 MG tablet Take 1 tablet (50 mg total) by mouth daily. 5 tablet 0   pregabalin (LYRICA) 150 MG capsule Take 1 capsule (150 mg total) by mouth 2 (two) times daily. 60 capsule 0   risperiDONE (RISPERDAL) 0.5 MG tablet Take 0.5 mg by mouth 2 (two) times daily.     traZODone (DESYREL) 100 MG tablet Take 100 mg by mouth at bedtime.     Vitamin D, Ergocalciferol, (DRISDOL) 1.25 MG (50000 UNIT) CAPS capsule TAKE 1 CAPSULE THE SAME DAY EACH WEEK. (Patient not taking: Reported on 04/27/2022) 12 capsule 1    Musculoskeletal: Strength & Muscle Tone: within normal limits Gait & Station: normal Patient leans: N/A          Psychiatric Specialty Exam:  Presentation  General Appearance:  Appropriate for Environment  Eye Contact: Good  Speech: Clear and Coherent  Speech Volume: Normal  Handedness: Right   Mood and Affect  Mood: Anxious; Depressed  Affect: Congruent   Thought Process  Thought Processes: Coherent  Descriptions of Associations:Intact  Orientation:Full (Time, Place and Person)  Thought Content:Logical  History of Schizophrenia/Schizoaffective disorder:Yes  Duration of  Psychotic Symptoms:Greater than six months  Hallucinations:Hallucinations: None  Ideas of Reference:None  Suicidal Thoughts:Suicidal Thoughts: Yes, Active SI Active Intent and/or Plan: Without Intent  Homicidal Thoughts:Homicidal Thoughts: Yes, Active HI Active Intent and/or Plan: Without Intent   Sensorium  Memory: Immediate  Good  Judgment: Good  Insight: Fair   Executive Functions  Concentration: Good  Attention Span: Good  Recall: Roel Cluck of Knowledge: Fair  Language: Good   Psychomotor Activity  Psychomotor Activity:  Assets  Assets: Desire for Improvement   Sleep  Sleep:Sleep: Fair    Physical Exam: Physical Exam Vitals and nursing note reviewed.  Constitutional:      Appearance: Normal appearance.  Neurological:     Mental Status: She is alert and oriented to person, place, and time.  Psychiatric:        Mood and Affect: Mood normal.        Thought Content: Thought content normal.    Review of Systems  Psychiatric/Behavioral:  Positive for suicidal ideas. The patient is nervous/anxious.   All other systems reviewed and are negative.  Blood pressure (!) 144/91, pulse 60, temperature 98.4 F (36.9 C), temperature source Oral, resp. rate 16, height 4\' 11"  (1.499 m), weight 61.2 kg, last menstrual period 06/07/2020, SpO2 99 %. Body mass index is 27.27 kg/m.  Treatment Plan Summary: Daily contact with patient to assess and evaluate symptoms and progress in treatment and Medication management  Disposition: Recommend psychiatric Inpatient admission when medically cleared.  This service was provided via telemedicine using a 2-way, interactive audio and video technology.  Names of all persons participating in this telemedicine service and their role in this encounter. Name: Evellyn Helbling  Role: patient   Name: T.Bobby Rumpf  Role: Nurse Practitioner          Derrill Center, NP 06/30/2022 11:10 AM

## 2022-06-30 NOTE — ED Notes (Signed)
TTS in progress 

## 2022-07-01 DIAGNOSIS — F200 Paranoid schizophrenia: Secondary | ICD-10-CM | POA: Diagnosis not present

## 2022-07-01 MED ORDER — NICOTINE 14 MG/24HR TD PT24
14.0000 mg | MEDICATED_PATCH | Freq: Every day | TRANSDERMAL | Status: DC
  Administered 2022-07-02 – 2022-07-10 (×7): 14 mg via TRANSDERMAL
  Filled 2022-07-01 (×12): qty 1

## 2022-07-01 NOTE — Progress Notes (Signed)
   07/01/22 2312  Psych Admission Type (Psych Patients Only)  Admission Status Voluntary  Psychosocial Assessment  Patient Complaints Anxiety  Eye Contact Fair  Facial Expression Flat  Affect Appropriate to circumstance  Speech Logical/coherent  Interaction Assertive  Motor Activity Other (Comment) (WDL)  Appearance/Hygiene Unremarkable  Behavior Characteristics Appropriate to situation  Mood Pleasant;Depressed  Thought Process  Coherency WDL  Content Paranoia  Delusions Paranoid  Perception WDL  Hallucination Auditory;Visual  Judgment Poor  Confusion None  Danger to Self  Current suicidal ideation? Denies  Self-Injurious Behavior No self-injurious ideation or behavior indicators observed or expressed   Agreement Not to Harm Self Yes  Description of Agreement verbal  Danger to Others  Danger to Others None reported or observed
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
## 2022-07-01 NOTE — Progress Notes (Signed)
[facility] Notes:  (Nursing/MHT/Case Management/Adjunct)  Date:  07/01/2022  Time: 2000 Type of Therapy:   wrap up group  Participation Level:  Active  Participation Quality:  Appropriate, Attentive, Sharing, and Supportive  Affect:  Depressed  Cognitive:  Alert  Insight:  Improving  Engagement in Group:  Developing/Improving  Modes of Intervention:  Clarification, Education, and Support  Summary of Progress/Problems: Positive thinking and positive change were discussed.   Shellia Cleverly 07/01/2022, 8:51 PM

## 2022-07-01 NOTE — H&P (Cosign Needed Addendum)
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  Homicidal Thoughts:Homicidal Thoughts: Yes, Active HI Active Intent and/or Plan: Without Intent  Sensorium  Memory: Immediate Good  Judgment: Good  Insight: Fair  Executive Functions  Concentration: Good  Attention Span: Good  Recall: Roel Cluck of Knowledge: Fair  Language: Good  Psychomotor Activity  Psychomotor Activity: Psychomotor Activity: Normal  Assets  Assets: Desire for Improvement  Sleep  Sleep: Sleep: Fair  Physical Exam: Physical Exam Vitals and nursing note reviewed.  Constitutional:      Appearance: She is obese.   HENT:     Head: Normocephalic.     Nose: Nose normal.     Mouth/Throat:     Mouth: Mucous membranes are moist.     Pharynx: Oropharynx is clear.  Eyes:     Conjunctiva/sclera: Conjunctivae normal.     Pupils: Pupils are equal, round, and reactive to light.  Cardiovascular:     Rate and Rhythm: Normal rate.     Pulses: Normal pulses.  Pulmonary:     Effort: Pulmonary effort is normal.  Abdominal:     Palpations: Abdomen is soft.  Genitourinary:    Comments: Deferred Musculoskeletal:        General: Normal range of motion.     Cervical back: Normal range of motion.  Skin:    General: Skin is warm.  Neurological:     General: No focal deficit present.     Mental Status: She is alert and oriented to person, place, and time.  Psychiatric:        Mood and Affect: Mood normal.        Behavior: Behavior normal.    Review of Systems  Constitutional: Negative.   HENT: Negative.    Eyes: Negative.   Respiratory: Negative.    Cardiovascular: Negative.   Gastrointestinal: Negative.   Genitourinary: Negative.  Musculoskeletal:  Positive for back pain.  Skin: Negative.   Neurological: Negative.   Endo/Heme/Allergies: Negative.   Psychiatric/Behavioral:  Positive for depression and substance abuse. The patient is nervous/anxious and has insomnia.    Blood pressure 131/79, pulse (!) 103, temperature 98.9 F (37.2 C), temperature source Oral, resp. rate 18, height 4\' 11"  (1.499 m), weight 86.6 kg, last menstrual period 06/07/2020, SpO2 97 %. Body mass index is 38.58 kg/m.  Treatment Plan Summary: Daily contact with patient to assess and evaluate symptoms and progress in treatment and Medication management  Observation Level/Precautions:  15 minute checks  Laboratory:  CBC Chemistry Profile HbAIC UDS UA  Psychotherapy: Therapeutic milieu  Medications: See MAR  Consultations: Social worker  Discharge Concerns: Safety  Estimated LOS: 5 to 7 days  Other: Not applicable    Labs: CMP: Sodium 134 low, glucose 141, calcium 8.4 low, otherwise normal CBC with differential: WBC 13.7 high, RDW 17.5 high, platelet 694 high, neutrophils 11.6 high, immature granulocyte 0.14 high, otherwise normal UDS: Positive for cocaine Blood toxicology: Positive for cocaine  Lipid profile ordered result pending  EKG: Normal sinus rhythm, ventricular rate of 96, QT /QTc 404/454  Physician Treatment Plan for Primary Diagnosis: Schizophrenia ([place]) Long Term Goal(s): Improvement in symptoms so as ready for discharge  Short Term Goals: Ability to identify changes in lifestyle to reduce recurrence of condition will improve, Ability to verbalize feelings will improve, Ability to disclose and discuss suicidal ideas, Ability to demonstrate self-control will improve, Ability to identify and develop effective coping behaviors will improve, Ability to maintain clinical measurements within normal limits will improve, Compliance with prescribed medications will improve, and Ability to identify triggers associated with substance abuse/mental health issues will improve  Physician Treatment Plan for Secondary Diagnosis:  Principal Problem:   Schizophrenia ([place])  Substance use disorder  Plan: --Initiate Cymbalta DR Capsule 60 mg p.o. daily for depression --Initiate Latuda tablets 40 mg p.o. daily with breakfast for bipolar d/o  --Initiate hydroxyzine 100 mg p.o. every 6 hours as needed for anxiety  Other medication for medical problems: --Amlodipine 5 mg tablets 1 p.o. daily for hypertension  --Vitamin D3 25 mcg 1000 units p.o. daily for vitamin D deficiency --Fluticasone furoate-vilanterol 200-25 mcg/ACT 1 puff daily for asthma --Lidoderm 5% patch 1 patch over 12 hours every 24 hours at 4:30 PM daily --Nicotine patch dose in milligrams per 24 hours 14 mg transdermal over 24 hours daily --Prednisone tablet 50 mg p.o. daily at 8:00 --Lyrica capsule 150 mg p.o. 2 times daily for nerve  pain --Albuterol Proventil 2.5 mg daily 3 ml 0.083% nebulizer solution 2.5 mg every 6 hours as needed wheezing shortness of breath  Agitation protocol: Benadryl capsule 50 mg p.o. 3 times daily as needed agitation or Benadryl injection 50 mg IM 3 times daily as needed agitation   Haldol tablets 5 mg po 3 times daily as needed agitation or Haldol lactate injection 5 mg IM 3 times daily as needed agitation   Lorazepam tablet 2 mg p.o. 3 times daily as needed agitation or Lorazepam injection 2 mg IM 3 times daily as needed agitation   Other PRN Medications -Maalox 30 mL oral every 4 as needed/digestion -Magnesium hydroxide 30 mL daily as needed/mild constipation   Safety and Monitoring: Voluntary admission to inpatient psychiatric unit for safety, stabilization and treatment Daily contact with patient to assess and evaluate symptoms and progress in treatment Patient's case to be discussed in multi-disciplinary team meeting Observation Level : q15 minute  checks Vital signs: q12 hours Precautions: suicide, but pt currently verbally contracts for safety on unit    Discharge Planning: Social work and case management to assist with discharge planning and identification of hospital follow-up needs prior to discharge Estimated LOS: 5-7 days Discharge Concerns: Need to establish a safety plan; Medication compliance and effectiveness Discharge Goals: Return home with outpatient referrals for mental health follow-up including medication management/psychotherapy.    I certify that inpatient services furnished can reasonably be expected to improve the patient's condition.    Laretta Bolster, FNP 3/16/20248:57 AM

## 2022-07-01 NOTE — Progress Notes (Signed)
Patient noted to be coughing and laying across her bed, she is falling asleep while writer is attempting to speak with her. Patient is groggy and unable to answer questions at this time. Respirations even and non-labored, BP 105/73 P 86 O2 97%. Dr. Renard Hamper made aware of patients condition with no new orders at this time.

## 2022-07-01 NOTE — Progress Notes (Signed)
D: Patient alert and oriented but has been sleeping on and off today. Rates depression and anxiety 10/10, rates chronic pain in back 10/10, and rates hopelessness 10/10. Patient reports some auditory and visual hallucinations last night, stating she saw a girl in her room on the wall and the girl was talking to her.   A: Scheduled medication administered to patient, per MD orders. Support and encouragement provided. Routine safety checks conducted every 15 minutes. Patient informed to notify staff with problems or concerns.   R: No adverse drug reactions noted. Patient contracts for safety at this time. Patient compliant with medications and treatment plan. Patient receptive, calm and cooperative. Patient interacts well with others on unit. Patient remains safe at this time.

## 2022-07-01 NOTE — Plan of Care (Signed)
  Problem: Education: Goal: Mental status will improve Outcome: Progressing   Problem: Health Behavior/Discharge Planning: Goal: Identification of resources available to assist in meeting health care needs will improve Outcome: Progressing Goal: Compliance with treatment plan for underlying cause of condition will improve Outcome: Progressing   Problem: Safety: Goal: Periods of time without injury will increase Outcome: Progressing   Problem: Coping: Goal: Coping ability will improve Outcome: Progressing

## 2022-07-01 NOTE — Progress Notes (Signed)
   06/30/22 2300  Psych Admission Type (Psych Patients Only)  Admission Status Voluntary  Psychosocial Assessment  Patient Complaints Anxiety;Depression;Hopelessness;Substance abuse  Eye Contact Fair  Facial Expression Flat  Affect Sad;Depressed  Speech Logical/coherent  Interaction Assertive  Motor Activity Slow  Appearance/Hygiene Unremarkable  Behavior Characteristics Appropriate to situation  Mood Anxious;Depressed  Thought Process  Coherency WDL  Content Paranoia  Delusions Paranoid  Perception WDL  Hallucination Auditory;Visual  Judgment Poor  Confusion None  Danger to Self  Current suicidal ideation? Passive  Self-Injurious Behavior Some self-injurious ideation observed or expressed.  No lethal plan expressed   Agreement Not to Harm Self Yes  Description of Agreement Verbal contract  Danger to Others  Danger to Others None reported or observed

## 2022-07-01 NOTE — Group Note (Signed)
Date:  07/01/2022 Time:  4:16 PM  Group Topic/Focus:  Self Care:   The focus of this group is to help patients understand the importance of self-care in order to improve or restore emotional, physical, spiritual, interpersonal, and financial health. Wellness Toolbox:   The focus of this group is to discuss various aspects of wellness, balancing those aspects and exploring ways to increase the ability to experience wellness.  Patients will create a wellness toolbox for use upon discharge.    Participation Level:  Active  Participation Quality:  Appropriate  Affect:  Appropriate  Cognitive:  Appropriate  Insight: Appropriate  Engagement in Group:  Engaged  Modes of Intervention:  Discussion  Additional Comments:    Garvin Fila 07/01/2022, 4:16 PM

## 2022-07-01 NOTE — Progress Notes (Signed)
   07/01/22 0628  15 Minute Checks  Location Bedroom  Visual Appearance Calm  Behavior Composed  Sleep (Behavioral Health Patients Only)  Calculate sleep? (Click Yes once per 24 hr at 0600 safety check) Yes  Documented sleep last 24 hours 9

## 2022-07-01 NOTE — Group Note (Signed)
Date:  07/01/2022 Time:  11:45 AM  Group Topic/Focus:  Goals Group:   The focus of this group is to help patients establish daily goals to achieve during treatment and discuss how the patient can incorporate goal setting into their daily lives to aide in recovery. Orientation:   The focus of this group is to educate the patient on the purpose and policies of crisis stabilization and provide a format to answer questions about their admission.  The group details unit policies and expectations of patients while admitted.    Participation Level:  Active  Participation Quality:  Appropriate  Affect:  Appropriate  Cognitive:  Appropriate  Insight: Appropriate  Engagement in Group:  Engaged  Modes of Intervention:  Discussion  Additional Comments:    Garvin Fila 07/01/2022, 11:45 AM

## 2022-07-01 NOTE — Plan of Care (Signed)
  Problem: Education: Goal: Knowledge of [facility] Education information/materials will improve Outcome: Progressing Goal: Mental status will improve Outcome: Progressing Goal: Verbalization of understanding the information provided will improve Outcome: Progressing   Problem: Activity: Goal: Interest or engagement in activities will improve Outcome: Progressing Goal: Sleeping patterns will improve Outcome: Progressing

## 2022-07-02 DIAGNOSIS — Z5900 Homelessness unspecified: Secondary | ICD-10-CM

## 2022-07-02 MED ORDER — PANTOPRAZOLE SODIUM 40 MG PO TBEC
40.0000 mg | DELAYED_RELEASE_TABLET | Freq: Every day | ORAL | Status: DC
  Administered 2022-07-02 – 2022-07-10 (×9): 40 mg via ORAL
  Filled 2022-07-02 (×13): qty 1

## 2022-07-02 MED ORDER — PREDNISONE 50 MG PO TABS
50.0000 mg | ORAL_TABLET | Freq: Every day | ORAL | Status: DC
  Administered 2022-07-02: 50 mg via ORAL
  Filled 2022-07-02 (×2): qty 1

## 2022-07-02 MED ORDER — LIDOCAINE 5 % EX PTCH
1.0000 | MEDICATED_PATCH | CUTANEOUS | Status: DC
  Administered 2022-07-02 – 2022-07-09 (×8): 1 via TRANSDERMAL
  Filled 2022-07-02 (×10): qty 1
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
## 2022-07-02 NOTE — BHH Group Notes (Signed)
The focus of this group is to help patients review their daily goal of treatment and discuss progress on daily workbooks. Pt was not present for tonight's wrap up group.  

## 2022-07-02 NOTE — Plan of Care (Signed)
  Problem: Health Behavior/Discharge Planning: Goal: Compliance with treatment plan for underlying cause of condition will improve Outcome: Progressing   Problem: Safety: Goal: Periods of time without injury will increase Outcome: Progressing   Problem: Coping: Goal: Coping ability will improve Outcome: Progressing   

## 2022-07-02 NOTE — Progress Notes (Signed)
Patient stated she had a hard time falling asleep last night and reports chronic lower back pain. Scheduled lidocaine patch applied which helped provide some relief. Patient denies SI but endorses auditory/visual hallucinations at times. Patient also reported constipation and indigestion and received milk of magnesia and maalox po prn which provided some relief but MD notified for complaints about acid reflux and protonix ordered. Patient is med compliant and cooperative. Remains mostly in bed reading and continues to use her walker for assistance ambulating.    07/02/22 0800  Psych Admission Type (Psych Patients Only)  Admission Status Voluntary  Psychosocial Assessment  Patient Complaints Depression  Eye Contact Fair  Facial Expression Flat  Affect Appropriate to circumstance  Speech Logical/coherent  Interaction Assertive  Motor Activity Slow  Appearance/Hygiene Unremarkable  Behavior Characteristics Cooperative;Appropriate to situation  Mood Depressed;Pleasant  Thought Process  Coherency WDL  Content Paranoia  Delusions Paranoid  Perception WDL  Hallucination Auditory;Visual  Judgment Poor  Confusion None  Danger to Self  Current suicidal ideation? Denies  Agreement Not to Harm Self Yes  Description of Agreement verbally contracts for safety  Danger to Others  Danger to Others None reported or observed

## 2022-07-02 NOTE — Progress Notes (Signed)
   07/02/22 2155  Psych Admission Type (Psych Patients Only)  Admission Status Voluntary  Psychosocial Assessment  Patient Complaints Insomnia  Eye Contact Fair  Facial Expression Flat  Affect Appropriate to circumstance  Speech Logical/coherent  Interaction Assertive  Motor Activity Slow (ambulates with walker)  Appearance/Hygiene Unremarkable  Behavior Characteristics Appropriate to situation  Mood Depressed;Pleasant  Thought Process  Coherency WDL  Content Paranoia  Delusions Paranoid  Perception WDL  Hallucination Auditory;Visual  Judgment Poor  Confusion None  Danger to Self  Current suicidal ideation? Denies  Self-Injurious Behavior No self-injurious ideation or behavior indicators observed or expressed   Agreement Not to Harm Self Yes  Description of Agreement verbal  Danger to Others  Danger to Others None reported or observed
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
## 2022-07-02 NOTE — Progress Notes (Signed)
BHH/BMU LCSW Progress Note   07/02/2022    2:24 PM  Kathryn [name]   [id number]   Type of Contact and Topic:  Transitional Housing options.   CSW provided patient with list of transitional housing and [place] [facility].  Patient agreed to start calling for disposition planning. CSW will continue to monitor.     Signed:  Riki Altes MSW, LCSW, LCAS 07/02/2022 2:24 PM

## 2022-07-02 NOTE — BHH Group Notes (Signed)
Pt did not attend goals group. 

## 2022-07-03 ENCOUNTER — Encounter (HOSPITAL_COMMUNITY): Payer: Self-pay

## 2022-07-03 DIAGNOSIS — F200 Paranoid schizophrenia: Secondary | ICD-10-CM

## 2022-07-03 MED ORDER — LURASIDONE HCL 20 MG PO TABS
60.0000 mg | ORAL_TABLET | Freq: Every day | ORAL | Status: DC
  Administered 2022-07-04 – 2022-07-10 (×7): 60 mg via ORAL
  Filled 2022-07-03 (×11): qty 3

## 2022-07-03 MED ORDER — CLONIDINE HCL 0.1 MG PO TABS: 0.1000 mg | ORAL_TABLET | ORAL | Status: DC | PRN

## 2022-07-03 MED ORDER — METHOCARBAMOL 500 MG PO TABS
500.0000 mg | ORAL_TABLET | Freq: Three times a day (TID) | ORAL | Status: DC | PRN
  Administered 2022-07-03 – 2022-07-09 (×12): 500 mg via ORAL
  Filled 2022-07-03 (×12): qty 1

## 2022-07-03 NOTE — Group Note (Signed)
Date:  07/03/2022 Time:  10:13 AM  Group Topic/Focus:  Goals Group:   The focus of this group is to help patients establish daily goals to achieve during treatment and discuss how the patient can incorporate goal setting into their daily lives to aide in recovery.    Participation Level:  Active  Participation Quality:  Appropriate  Affect:  Appropriate  Cognitive:  Appropriate  Insight: Appropriate  Engagement in Group:  Engaged  Modes of Intervention:  Discussion  Additional Comments:   Patient identified ways to manage  her emotional cues that involved feelings that were negative in nature.  [name] 07/03/2022, 10:13 AM

## 2022-07-03 NOTE — Progress Notes (Signed)
Pt denied SI/HI/AVH this morning. Pt rated her depression a 10/10. Pt complains of 10/10 chronic lower back pain and spasms throughout the day. RN provided pt with PRN Robaxin and heat packs. Pt has remained isolated in her room for most of the day. Pt has been cooperative throughout the shift. RN provided support and encouragement to patient. Pt given scheduled medications as prescribed. Q15 min checks verified for safety. Patient verbally contracts for safety. Patient compliant with medications and treatment plan. Patient is interacting well on the unit. Pt is safe on the unit.   07/03/22 1100  Psych Admission Type (Psych Patients Only)  Admission Status Voluntary  Psychosocial Assessment  Patient Complaints Depression;Other (Comment) (Chronic mid lower back pain)  Eye Contact Fair  Facial Expression Sad  Affect Appropriate to circumstance  Speech Logical/coherent  Interaction Assertive  Motor Activity Slow (ambulates with walker)  Appearance/Hygiene Unremarkable  Behavior Characteristics Appropriate to situation  Mood Depressed;Pleasant  Thought Process  Coherency WDL  Content Paranoia  Delusions Paranoid  Perception WDL  Hallucination None reported or observed  Judgment Poor  Confusion None  Danger to Self  Current suicidal ideation? Denies  Self-Injurious Behavior No self-injurious ideation or behavior indicators observed or expressed   Agreement Not to Harm Self Yes  Description of Agreement Pt verbally contracts for safety  Danger to Others  Danger to Others None reported or observed

## 2022-07-03 NOTE — BH IP Treatment Plan (Signed)
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[place])   GAD (generalized anxiety disorder)   Substance induced mood disorder ([place])   Homelessness   Paranoid schizophrenia ([place])   Current Medications:  Current Facility-Administered Medications  Medication Dose Route Frequency Provider Last Rate Last Admin   albuterol (PROVENTIL) (2.5 MG/3ML) 0.083% nebulizer solution 2.5 mg  2.5 mg Nebulization Q6H PRN Lucky Rathke, FNP       alum & mag hydroxide-simeth (MAALOX/MYLANTA) 200-200-20 MG/5ML suspension 30 mL  30 mL Oral Q4H PRN Lucky Rathke, FNP   30 mL at 07/02/22 1435   amLODipine (NORVASC) tablet 5 mg  5 mg Oral Daily Lucky Rathke, FNP   5 mg at 07/03/22 0757   cholecalciferol (VITAMIN D3) 25 MCG (1000 UNIT) tablet 1,000 Units  1,000 Units Oral Daily Lucky Rathke, FNP   1,000 Units at 07/03/22 0757   cloNIDine (CATAPRES) tablet 0.1 mg  0.1 mg Oral Q4H PRN Massengill, Ovid Curd, MD       diphenhydrAMINE (BENADRYL) capsule 50 mg  50 mg Oral TID PRN Lucky Rathke, FNP       Or   diphenhydrAMINE (BENADRYL) injection 50 mg  50 mg Intramuscular TID PRN Lucky Rathke, FNP       DULoxetine (CYMBALTA) DR capsule 60 mg  60 mg Oral Daily Lucky Rathke, FNP   60 mg at 07/03/22 0758   fluticasone furoate-vilanterol (BREO ELLIPTA) 200-25 MCG/ACT 1 puff  1 puff Inhalation Daily Lucky Rathke, FNP   1 puff at 07/03/22 0801   haloperidol (HALDOL) tablet 5 mg  5 mg Oral TID PRN Lucky Rathke, FNP       Or   haloperidol lactate (HALDOL) injection 5 mg  5 mg Intramuscular TID PRN Lucky Rathke, FNP       hydrOXYzine (ATARAX) tablet 100 mg  100 mg Oral Q6H PRN  Massengill, Ovid Curd, MD   100 mg at 07/02/22 2116   lidocaine (LIDODERM) 5 % 1 patch  1 patch Transdermal Q24H France Ravens, MD   1 patch at 07/03/22 0954   LORazepam (ATIVAN) tablet 2 mg  2 mg Oral TID PRN Lucky Rathke, FNP       Or   LORazepam (ATIVAN) injection 2 mg  2 mg Intramuscular TID PRN Lucky Rathke, FNP       lurasidone (LATUDA) tablet 40 mg  40 mg Oral Q breakfast Lucky Rathke, FNP   40 mg at 07/03/22 0757   magnesium hydroxide (MILK OF MAGNESIA) suspension 30 mL  30 mL Oral Daily PRN Lucky Rathke, FNP   30 mL at 07/02/22 0800   methocarbamol (ROBAXIN) tablet 500 mg  500 mg Oral Q8H PRN Massengill, Ovid Curd, MD   500 mg at 07/03/22 [id number]   nicotine (NICODERM CQ - dosed in mg/24 hours) patch 14 mg  14 mg Transdermal Daily Massengill, Ovid Curd, MD   14 mg at 07/03/22 0802   pantoprazole (PROTONIX) EC tablet 40 mg  40 mg Oral Daily France Ravens, MD   40 mg at 07/03/22 0758   pregabalin (LYRICA) capsule 150 mg  150 mg Oral BID Lucky Rathke, FNP   150 mg at 07/03/22 [phone]  PTA Medications: Medications Prior to Admission  Medication Sig Dispense Refill Last Dose   albuterol (PROVENTIL) (2.5 MG/3ML) 0.083% nebulizer solution Take 3 mLs (2.5 mg total) by nebulization every 6 (six) hours as needed for wheezing or shortness of breath. 75 mL 12 Past Week   albuterol (VENTOLIN HFA) 108 (90 Base) MCG/ACT inhaler Inhale 2 puffs into the lungs every 4 (four) hours as needed for wheezing or shortness of breath. 1 each 1 Past Week   amLODipine (NORVASC) 5 MG tablet Take 1 tablet (5 mg total) by mouth daily. 90 tablet 1 unk   amoxicillin-clavulanate (AUGMENTIN) 875-125 MG tablet Take 1 tablet by mouth 2 (two) times daily.   unk   anagrelide (AGRYLIN) 1 MG capsule Take 1 capsule (1 mg total) by mouth 2 (two) times daily. 60 capsule 0 unk   benzonatate (TESSALON) 200 MG capsule Take by mouth.   unk   BREO ELLIPTA 200-25 MCG/ACT AEPB INHALE 1 PUFF EVERY DAY 180 each 10 unk   cholecalciferol (VITAMIN D3) 25  MCG (1000 UNIT) tablet Take 1,000 Units by mouth daily.   unk   citalopram (CELEXA) 20 MG tablet Take 20 mg by mouth daily.   unk   DULoxetine (CYMBALTA) 60 MG capsule Take 1 capsule (60 mg total) by mouth daily. 30 capsule 0 unk   furosemide (LASIX) 20 MG tablet Take 1 tablet (20 mg total) by mouth daily. 30 tablet 10 unk   hydrOXYzine (VISTARIL) 25 MG capsule Take 4 capsules (100 mg total) by mouth every 6 (six) hours as needed. 15 capsule 0 unk   lubiprostone (AMITIZA) 24 MCG capsule Take 1 capsule (24 mcg total) by mouth 2 (two) times daily with a meal. 60 capsule 0 unk   lurasidone (LATUDA) 40 MG TABS tablet Take 1 tablet (40 mg total) by mouth daily with breakfast. 30 tablet 0 unk   mometasone-formoterol (DULERA) 100-5 MCG/ACT AERO Inhale 2 puffs into the lungs 2 (two) times daily. 1 each 0 unk   omeprazole (PRILOSEC) 20 MG capsule Take 20 mg by mouth daily.   unk   pantoprazole (PROTONIX) 40 MG tablet Take 1 tablet (40 mg total) by mouth daily. 30 tablet 0 unk   potassium chloride SA (KLOR-CON M) 20 MEQ tablet Take 1 tablet (20 mEq total) by mouth daily. TAKE (1) TABLET BY MOUTH ONCE A DAY. Strength: 20 mEq 30 tablet 0 unk   predniSONE (DELTASONE) 50 MG tablet Take 1 tablet (50 mg total) by mouth daily. 5 tablet 0 unk   pregabalin (LYRICA) 150 MG capsule Take 1 capsule (150 mg total) by mouth 2 (two) times daily. 60 capsule 0 unk   risperiDONE (RISPERDAL) 0.5 MG tablet Take 0.5 mg by mouth 2 (two) times daily.   unk   Vitamin D, Ergocalciferol, (DRISDOL) 1.25 MG (50000 UNIT) CAPS capsule TAKE 1 CAPSULE THE SAME DAY EACH WEEK. 12 capsule 1 unk    Patient Stressors: Soil scientist issue   Medication change or noncompliance   Substance abuse    Patient Strengths: Supportive family/friends   Treatment Modalities: Medication Management, Group therapy, Case management,  1 to 1 session with clinician, Psychoeducation, Recreational therapy.   Physician Treatment Plan for  Primary Diagnosis: MDD (major depressive disorder), recurrent episode, severe ([place]) Long Term Goal(s): Improvement in symptoms so as ready for discharge   Short Term Goals: Ability to identify changes in lifestyle to reduce recurrence of condition will improve Ability to verbalize feelings will improve Ability to  disclose and discuss suicidal ideas Ability to demonstrate self-control will improve Ability to identify and develop effective coping behaviors will improve Ability to maintain clinical measurements within normal limits will improve Compliance with prescribed medications will improve Ability to identify triggers associated with substance abuse/mental health issues will improve  Medication Management: Evaluate patient's response, side effects, and tolerance of medication regimen.  Therapeutic Interventions: 1 to 1 sessions, Unit Group sessions and Medication administration.  Evaluation of Outcomes: Progressing  Physician Treatment Plan for Secondary Diagnosis: Principal Problem:   MDD (major depressive disorder), recurrent episode, severe ([place]) Active Problems:   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[facility])   GAD (generalized anxiety disorder)   Substance induced mood disorder ([place])   Homelessness   Paranoid schizophrenia ([place])  Long Term Goal(s): Improvement in symptoms so as ready for discharge   Short Term Goals: Ability to identify changes in lifestyle to reduce recurrence of condition will improve Ability to verbalize feelings will improve Ability to disclose and discuss suicidal ideas Ability to demonstrate self-control will improve Ability to identify and develop effective coping behaviors will improve Ability to maintain clinical measurements within normal limits will improve Compliance with prescribed medications will improve Ability to identify triggers associated with substance abuse/mental health issues will improve     Medication Management:  Evaluate patient's response, side effects, and tolerance of medication regimen.  Therapeutic Interventions: 1 to 1 sessions, Unit Group sessions and Medication administration.  Evaluation of Outcomes: Progressing   RN Treatment Plan for Primary Diagnosis: MDD (major depressive disorder), recurrent episode, severe ([place]) Long Term Goal(s): Knowledge of disease and therapeutic regimen to maintain health will improve  Short Term Goals: Ability to remain free from injury will improve, Ability to verbalize frustration and anger appropriately will improve, Ability to demonstrate self-control, Ability to participate in decision making will improve, Ability to verbalize feelings will improve, Ability to disclose and discuss suicidal ideas, Ability to identify and develop effective coping behaviors will improve, and Compliance with prescribed medications will improve  Medication Management: RN will administer medications as ordered by provider, will assess and evaluate patient's response and provide education to patient for prescribed medication. RN will report any adverse and/or side effects to prescribing provider.  Therapeutic Interventions: 1 on 1 counseling sessions, Psychoeducation, Medication administration, Evaluate responses to treatment, Monitor vital signs and CBGs as ordered, Perform/monitor CIWA, COWS, AIMS and Fall Risk screenings as ordered, Perform wound care treatments as ordered.  Evaluation of Outcomes: Progressing   LCSW Treatment Plan for Primary Diagnosis: MDD (major depressive disorder), recurrent episode, severe ([place]) Long Term Goal(s): Safe transition to appropriate next level of care at discharge, Engage patient in therapeutic group addressing interpersonal concerns.  Short Term Goals: Engage patient in aftercare planning with referrals and resources, Increase social support, Increase ability to appropriately verbalize feelings, Increase emotional regulation, Facilitate  acceptance of mental health diagnosis and concerns, Facilitate patient progression through stages of change regarding substance use diagnoses and concerns, Identify triggers associated with mental health/substance abuse issues, and Increase skills for wellness and recovery  Therapeutic Interventions: Assess for all discharge needs, 1 to 1 time with Social worker, Explore available resources and support systems, Assess for adequacy in community support network, Educate family and significant other(s) on suicide prevention, Complete Psychosocial Assessment, Interpersonal group therapy.  Evaluation of Outcomes: Progressing   Progress in Treatment: Attending groups: Yes. Participating in groups: Yes. Taking medication as prescribed: Yes. Toleration medication: Yes. Family/Significant other contact made: No, will contact:  friend,  Philomena Doheny [phone] Patient understands diagnosis: Yes. Discussing patient identified problems/goals with staff: Yes. Medical problems stabilized or resolved: No. Denies suicidal/homicidal ideation: No. Issues/concerns per patient self-inventory: No.  New problem(s) identified: No, Describe:  none reported   New Short Term/Long Term Goal(s):  medication stabilization, elimination of SI thoughts, development of comprehensive mental wellness plan.    Patient Goals:  Pt states, "I need to stay on my medication and find me a place to go"  Discharge Plan or Barriers: Patient recently admitted. CSW will continue to follow and assess for appropriate referrals and possible discharge planning.    Reason for Continuation of Hospitalization: Anxiety Depression Medication stabilization Suicidal ideation Withdrawal symptoms  Estimated Length of Stay: 3-5 days  Last [place] Suicide Severity Risk Score: [place] Admission (Current) from 06/30/2022 in [facility] ED from 06/29/2022 in [facility] Emergency  Department at [facility] from 06/27/2022 in [facility] Emergency Department at [place] High Risk No Risk       Last PHQ 2/9 Scores:    02/02/2022    9:20 AM 12/20/2021   11:26 AM 11/03/2021    9:10 AM  Depression screen PHQ 2/9  Decreased Interest 0 3 0  Down, Depressed, Hopeless 0 3 0  PHQ - 2 Score 0 6 0  Altered sleeping 0 3   Tired, decreased energy 0 3   Change in appetite 0 3   Feeling bad or failure about yourself  0 3   Trouble concentrating 0 3   Moving slowly or fidgety/restless 0 0   Suicidal thoughts 0 3   PHQ-9 Score 0 24   Difficult doing work/chores Not difficult at all Very difficult     Scribe for Treatment Team: Zachery Conch, LCSW 07/03/2022 3:53 PM

## 2022-07-03 NOTE — Group Note (Signed)
Date:  07/03/2022 Time:  10:16 AM  Group Topic/Focus:  Orientation:   The focus of this group is to educate the patient on the purpose and policies of crisis stabilization and provide a format to answer questions about their admission.  The group details unit policies and expectations of patients while admitted.    Participation Level:  Active  Participation Quality:  Attentive  Affect:  Appropriate  Cognitive:  Appropriate  Insight: Good  Engagement in Group:  Engaged  Modes of Intervention:  Discussion  Additional Comments:     [name] 07/03/2022, 10:16 AM

## 2022-07-03 NOTE — Progress Notes (Signed)
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 female w/ hx of cocaine induced mood disorder, MDD who presented to [facility] from [facility] due to [place] in setting of cocaine abuse.  This is hospitalization day 3.  24-hour chart review: Past 24 hours of patient's chart was reviewed.  Patient is compliant with scheduled meds. Required Agitation PRNs: none Per RN notes, no documented behavioral issues and is attending group. Patient slept, 8 hours  Today's assessment: Patient seen and examined sitting up on her bed.  Chart reviewed and findings shared with the treatment team and consult with the attending psychiatrist.   Patient reports concern for elevated blood pressure of 141/99, pulse 85.  Patient remains asymptomatic.  Clonidine 0.1 mg p.o. added to treatment plan as needed every 4 hours for systolic blood pressure greater than [id number] or diastolic blood pressure greater than 100.  Also complains of back pain, made patient aware that she has as needed medication of Robaxin 500 mg p.o. every 8 hours, and also Lidoderm 5% patch every 12 hours for back pain.  But no other acute concerns at this time.  Reports mood is "irritable," reports anxiety 10/10, primarily related to housing situation at point of discharge.  Patient is seeking substance use treatment or sober living communities.  Patient reports her mood symptoms are predominantly related to her housing instability.  Patient believes that if she has housing, she would not have  experience any symptoms of depression she rates as 10/10.  Patient reports using a ball of cocaine every day with her friends.  Reports interest in abstaining from cocaine use and getting her own housing once she has accrued enough money from her monthly check.Patient denies HI, however continues to endorse passive SI with plans to cut her wrist when she gets out of the hospital, auditory hallucination of hearing voices telling her to kill herself or others will kill her, and visual hallucination of seeing people walking on the walls.  No recent labs to review.  Will continue with current treatment plan and adjustment of medication as reported above.  Will increase Latuda from 40 mg p.o. daily with breakfast to 60 mg p.o. daily with breakfast for bipolar disorder symptoms.  Total Time spent with patient: 45 minutes  Past Psychiatric History:  Previous Psych Diagnoses: MDD, cocaine-induced mood disorder, polysubstance abuse, schizoaffective disorder bipolar type Prior inpatient treatment: She was hospitalized at [facility] from 10/17/21 to 12/07/21; & 01/09/19 23-10 /04/2021. She has also been admitted to [facility] last year; Current/prior outpatient treatment: She receives her current outpatient psychiatric care from [facility] in [place].  Psychotherapy hx: unknown History of suicide: previous attempt September 2020 by consuming bleach and vinegar, Yes, 2019 patient cut wrist, 2023 patient drank Clorox and vinegar, 2024 patient drank Clorox. History of homicide: none Psychiatric medication history: Duloxetine 30 mg, Benztropine 0.5 mg, citalopram 20 mg,  Psychiatric medication compliance history: poor compliance Prior inpatient treatment: Yes, see above Current/prior outpatient treatment: Yes Prior rehab hx: Patient denies Psychiatric medication history: Yes Neuromodulation history: Denies Current Psychiatrist: Dr.  Lay DayMark Current therapist: Patient denies  Past Medical History:  Past Medical  History:  Diagnosis Date   Anxiety    Arthritis    Asthma    Bipolar disorder ([place])    Chronic back pain    COPD (chronic obstructive pulmonary disease) (HCC)    Chronic bronchitis   Depression    Dyspnea    GERD (gastroesophageal reflux disease)    Hypertension    Migraine    Neuropathy    Osteoarthritis of left knee, 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   Single subsegmental pulmonary embolism without acute cor pulmonale ([place]) 06/20/2021   Sleep apnea 04/2020   GETTING A cpap   Suicidal ideation 12/24/2018    Past Surgical History:  Procedure Laterality Date   BACK SURGERY     DILATATION AND CURETTAGE/HYSTEROSCOPY WITH MINERVA N/A 06/09/2020   Procedure: DILATATION AND CURETTAGE/HYSTEROSCOPY WITH MINERVA;  Surgeon: Florian Buff, MD;  Location: [facility] ORS;  Service: Gynecology;  Laterality: N/A;   ESOPHAGOGASTRODUODENOSCOPY (EGD) WITH PROPOFOL N/A 12/25/2018   Procedure: ESOPHAGOGASTRODUODENOSCOPY (EGD) WITH PROPOFOL;  Surgeon: Rogene Houston, MD;  Location: [facility] ENDO SUITE;  Service: Endoscopy;  Laterality: N/A;   FLEXIBLE SIGMOIDOSCOPY  10/14/2021   Procedure: FLEXIBLE SIGMOIDOSCOPY;  Surgeon: Harvel Quale, MD;  Location: [facility] ENDO SUITE;  Service: Gastroenterology;;   PATELLA-FEMORAL ARTHROPLASTY Left 10/08/2018   Procedure: PATELLA-FEMORAL ARTHROPLASTY;  Surgeon: Marchia Bond, MD;  Location: [facility] ORS;  Service: Orthopedics;  Laterality: Left;   TUBAL LIGATION     Family History:  Family History  Problem Relation Age of Onset   Gout Paternal Grandfather    Cirrhosis Paternal Grandfather    Hypertension Paternal Grandmother    Aneurysm Paternal Grandmother    Cirrhosis Maternal Grandmother    Cirrhosis Maternal Grandfather    Cancer Father    Cirrhosis Father    Cirrhosis Mother    Breast cancer Sister    Hypertension Sister    Bronchitis Daughter    Bronchitis Daughter    Asthma Son    Bronchitis Son    Migraines Neg Hx    Social History:  Social History    Substance and Sexual Activity  Alcohol Use Not Currently   Comment: started drinking again last night while at a party as of 06/29/22     Social History   Substance and Sexual Activity  Drug Use Yes   Types: Cocaine    Social History   Socioeconomic History   Marital status: Single    Spouse name: Not on file   Number of children: 3   Years of education: 10   Highest education level: 10th grade  Occupational History   Not on file  Tobacco Use   Smoking status: Every Day    Packs/day: 0.25    Years: 10.00    Additional pack years: 0.00    Total pack years: 2.50    Types: Cigarettes    Last attempt to quit: 04/2020    Years since quitting: 2.2    Passive exposure: Never   Smokeless tobacco: Never   Tobacco comments:    Smokes often, especially when using crack cocaine  Vaping Use   Vaping Use: Never used  Substance and Sexual Activity   Alcohol use: Not Currently    Comment: started drinking again last night while at a party as of 06/29/22   Drug use: Yes    Types: Cocaine   Sexual activity: Yes    Birth control/protection: Surgical    Comment: tubal, ablation  Other Topics Concern   Not on file  Social History Narrative   R handed    Lives with boyfriend   1 Cup of caffeine daily    Social Determinants of Health   Financial Resource Strain: Not on file  Food Insecurity: Food Insecurity Present (06/30/2022)   Hunger Vital Sign    Worried About Running Out of Food in the Last Year: Often true    Ran Out of Food in the Last Year: Often true  Transportation Needs: Unmet Transportation Needs (06/30/2022)   PRAPARE - Hydrologist (Medical): Yes    Lack of Transportation (Non-Medical): Yes  Physical Activity: Not on file  Stress: Not on file  Social Connections: Not on file   Additional Social History:    Current Medications: Current Facility-Administered Medications  Medication Dose Route Frequency Provider Last Rate Last Admin    albuterol (PROVENTIL) (2.5 MG/3ML) 0.083% nebulizer solution 2.5 mg  2.5 mg Nebulization Q6H PRN Lucky Rathke, FNP       alum & mag hydroxide-simeth (MAALOX/MYLANTA) 200-200-20 MG/5ML suspension 30 mL  30 mL Oral Q4H PRN Lucky Rathke, FNP   30 mL at 07/02/22 1435   amLODipine (NORVASC) tablet 5 mg  5 mg Oral Daily Lucky Rathke, FNP   5 mg at 07/03/22 0757   cholecalciferol (VITAMIN D3) 25 MCG (1000 UNIT) tablet 1,000 Units  1,000 Units Oral Daily Lucky Rathke, FNP   1,000 Units at 07/03/22 0757   cloNIDine (CATAPRES) tablet 0.1 mg  0.1 mg Oral Q4H PRN Massengill, Ovid Curd, MD       diphenhydrAMINE (BENADRYL) capsule 50 mg  50 mg Oral TID PRN Lucky Rathke, FNP       Or   diphenhydrAMINE (BENADRYL) injection 50 mg  50 mg Intramuscular TID PRN Lucky Rathke, FNP       DULoxetine (CYMBALTA) DR capsule 60 mg  60 mg Oral Daily Lucky Rathke, FNP   60 mg at 07/03/22 0758   fluticasone furoate-vilanterol (BREO ELLIPTA) 200-25 MCG/ACT 1 puff  1 puff Inhalation Daily Lucky Rathke, FNP   1 puff at 07/03/22 0801   haloperidol (HALDOL) tablet 5 mg  5 mg Oral TID PRN Lucky Rathke, FNP       Or   haloperidol lactate (HALDOL) injection 5 mg  5 mg Intramuscular TID PRN Lucky Rathke, FNP       hydrOXYzine (ATARAX) tablet 100 mg  100 mg Oral Q6H PRN Massengill, Ovid Curd, MD   100 mg at 07/02/22 2116   lidocaine (LIDODERM) 5 % 1 patch  1 patch Transdermal Q24H France Ravens, MD   1 patch at 07/03/22 0954   LORazepam (ATIVAN) tablet 2 mg  2 mg Oral TID PRN Lucky Rathke, FNP       Or   LORazepam (ATIVAN) injection 2 mg  2 mg Intramuscular TID PRN Lucky Rathke, FNP       lurasidone (LATUDA) tablet 40 mg  40 mg Oral Q breakfast Lucky Rathke, FNP   40 mg at 07/03/22 0757   magnesium hydroxide (MILK OF MAGNESIA) suspension 30 mL  30 mL Oral Daily PRN Lucky Rathke, FNP   30 mL at 07/02/22 0800   methocarbamol (ROBAXIN) tablet 500 mg  500 mg Oral Q8H PRN Massengill, Ovid Curd, MD   500 mg at 07/03/22 0938   nicotine  (NICODERM CQ - dosed in mg/24 hours) patch 14 mg  14  mg Transdermal Daily Massengill, Nathan, MD   14 mg at 07/03/22 0802   pantoprazole (PROTONIX) EC tablet 40 mg  40 mg Oral Daily France Ravens, MD   40 mg at 07/03/22 0758   pregabalin (LYRICA) capsule 150 mg  150 mg Oral BID Lucky Rathke, FNP   150 mg at 07/03/22 [id number]   Lab Results:  No results found for this or any previous visit (from the past 24 hour(s)).  Blood Alcohol level:  Lab Results  Component Value Date   ETH <10 06/29/2022   ETH <10 [id number]   Metabolic Disorder Labs: Lab Results  Component Value Date   HGBA1C 5.5 01/10/2022   MPG 111.15 01/10/2022   MPG 111.15 10/19/2021   No results found for: "PROLACTIN" Lab Results  Component Value Date   CHOL 168 01/10/2022   TRIG 56 01/10/2022   HDL 57 01/10/2022   CHOLHDL 2.9 01/10/2022   VLDL 11 01/10/2022   LDLCALC 100 (H) 01/10/2022   LDLCALC 84 10/19/2021    Physical Findings:  Musculoskeletal: Strength & Muscle Tone: within normal limits Gait & Station: normal  Psychiatric Specialty Exam:  Presentation  General Appearance:  Appropriate for Environment; Fairly Groomed; Casual  Eye Contact: Good  Speech: Clear and Coherent; Normal Rate  Speech Volume: Normal  Handedness: Right  Mood and Affect  Mood: Anxious; Depressed  Affect: Congruent; Appropriate  Thought Process  Thought Processes: Coherent  Descriptions of Associations:Intact  Orientation:Full (Time, Place and Person)  Thought Content:Logical  History of Schizophrenia/Schizoaffective disorder:No  Duration of Psychotic Symptoms:Greater than six months  Hallucinations:Hallucinations: None  Ideas of Reference:None  Suicidal Thoughts:Suicidal Thoughts: Yes, Passive SI Active Intent and/or Plan: Without Intent; Without Plan; Without Means to Carry Out SI Passive Intent and/or Plan: Without Intent; Without Plan; Without Means to Carry Out  Homicidal Thoughts:Homicidal  Thoughts: No HI Active Intent and/or Plan: -- (Denies)  Sensorium  Memory: Immediate Good; Recent Good  Judgment: Fair  Insight: Fair  Executive Functions  Concentration: Good  Attention Span: Good  Recall: Good  Fund of Knowledge: Good  Language: Good  Psychomotor Activity  Psychomotor Activity:Psychomotor Activity: Normal (Ambulates with walker)   Assets  Assets: Communication Skills; Desire for Improvement; Physical Health  Sleep  Sleep:Sleep: Good Number of Hours of Sleep: 8  Physical Exam: Review of Systems  Constitutional: Negative.   HENT: Negative.    Eyes: Negative.   Respiratory:  Negative for shortness of breath.   Cardiovascular: Negative.  Negative for chest pain.  Gastrointestinal:  Negative for abdominal pain, constipation, diarrhea, heartburn, nausea and vomiting.  Genitourinary: Negative.   Musculoskeletal:  Positive for back pain (s/p back surgery).  Neurological:  Negative for headaches.  Endo/Heme/Allergies: Negative.   Psychiatric/Behavioral:  Positive for depression, hallucinations, substance abuse and suicidal ideas. The patient is nervous/anxious and has insomnia.    Blood pressure (!) 141/99, pulse 85, temperature 98.1 F (36.7 C), resp. rate 16, height 4\' 11"  (1.499 m), weight 86.6 kg, last menstrual period 06/07/2020, SpO2 100 %. Body mass index is 38.58 kg/m.   ASSESSMENT AND PLAN Rydia a [place] is a 52 year old African-American homeless female with prior psychiatric history significant for schizophrenia, 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  Patient [facility] from [facility], ED hospital for worsening depression resulting in suicidal ideations in the context of cocaine use disorder. This is hospitalization day 3.  Patient's mood symptoms predominantly correlated with her cocaine use and housing.  She does not exhibit any psychotic nor  depressive symptoms outside of it.  LCSW to provide substance use housing resources to aid with patient's homelessness which seems to be a primary source of her distress and mood symptoms.  PLAN Safety and Monitoring: Voluntary admission to inpatient psychiatric unit for safety, stabilization and treatment Daily contact with patient to assess and evaluate symptoms and progress in treatment Patient's case to be discussed in multi-disciplinary team meeting Observation Level : q15 minute checks Vital signs: q12 hours Precautions: suicide, elopement, and assault  Psychiatric Problems MDD-Severe, recurrent episode without psychosis Stimulant use disorder-cocaine Nicotine use disorder Housing Instability --Continue Cymbalta DR Capsule 60 mg p.o. daily for depression -- Increase Latuda tablets from 40 mg p.o. to 60 mg daily with breakfast for bipolar d/o starting tomorrow 07/04/2022. --Start clonidine Catapres tablet 0.1 mg p.o. every 4 hours as needed if systolic blood pressure over [id number] or if diastolic blood pressure over 100 --Continue hydroxyzine 100 mg p.o. every 6 hours as needed for anxiety --Nicotine patch for NRT  Medical Problems Hypertension Likely also secondary to cocaine use 142/88 today -Amlodipine 5 mg daily  Asthma Recent exacerbation and placed on 5 day course of prednisone on 06/27/22 -Fluticasone furoate-vilanterol 200-25 mcg/ACT 1 puff daily  -Completed 5 day course of prednisone today (07/02/22)  Back Pain -Lyrica 150 mg twice daily -Lidocaine patch  PRNs Tylenol 650 mg for mild pain Maalox/Mylanta 30 mL for indigestion Hydroxyzine 25 mg tid for anxiety Milk of Magnesia 30 mL for constipation Trazodone 50 mg for sleep   4. Discharge Planning: Social work and case management to assist with discharge planning and identification of hospital follow-up needs prior to discharge Estimated LOS: 3 days Discharge Concerns: Need to establish a safety plan; Medication  compliance and effectiveness Discharge Goals: Return home with outpatient referrals for mental health follow-up including medication management/psychotherapy  Laretta Bolster, FNP 07/03/2022, 4:16 PMPatient ID: Ferdinand Lango, female   DOB: 12-Feb-1971, 52 y.o.   MRN: [id number]

## 2022-07-03 NOTE — Group Note (Signed)
Recreation Therapy Group Note   Group Topic:Stress Management  Group Date: 07/03/2022 Start Time: 0935 End Time: 1000 Facilitators: K[place]ton Rios-McCall, LRT,CTRS Location: [address] Dayroom   Goal Area(s) Addresses:  Patient will actively participate in stress management techniques presented during session.  Patient will successfully identify benefit of practicing stress management post d/c.   Group Description: Guided Imagery. LRT provided education, instruction, and demonstration on practice of visualization via guided imagery. Patient was asked to participate in the technique introduced during session. LRT debriefed including topics of mindfulness, stress management and specific scenarios each patient could use these techniques. Patients were given suggestions of ways to access scripts post d/c and encouraged to explore Youtube and other apps available on smartphones, tablets, and computers.   Affect/Mood: N/A   Participation Level: Did not attend    Clinical Observations/Individualized Feedback:     Plan: Continue to engage patient in RT group sessions 2-3x/week.   Davanee Klinkner-McCall, LRT,CTRS  07/03/2022 11:54 AM

## 2022-07-03 NOTE — Plan of Care (Signed)
  Problem: Education: Goal: Mental status will improve Outcome: Progressing   Problem: Activity: Goal: Sleeping patterns will improve Outcome: Progressing   Problem: Physical Regulation: Goal: Ability to maintain clinical measurements within normal limits will improve Outcome: Progressing   Problem: Safety: Goal: Periods of time without injury will increase Outcome: Progressing   Problem: Coping: Goal: Coping ability will improve Outcome: Progressing

## 2022-07-03 NOTE — Group Note (Signed)
Date:  07/03/2022 Time:  10:09 AM  Group Topic/Focus:  Orientation:   The focus of this group is to educate the patient on the purpose and policies of crisis stabilization and provide a format to answer questions about their admission.  The group details unit policies and expectations of patients while admitted.    Participation Level:  Active  Participation Quality:  Appropriate  Affect:  Appropriate  Cognitive:  Appropriate  Insight: Appropriate  Engagement in Group:  Engaged  Modes of Intervention:  Discussion  Additional Comments:     [name] 07/03/2022, 10:09 AM

## 2022-07-04 LAB — RESPIRATORY PANEL BY PCR

## 2022-07-04 LAB — RESP PANEL BY RT-PCR (RSV, FLU A&B, COVID)  RVPGX2
Influenza A by PCR: NEGATIVE
Influenza B by PCR: NEGATIVE
Resp Syncytial Virus by PCR: NEGATIVE
SARS Coronavirus 2 by RT PCR: NEGATIVE

## 2022-07-04 MED ORDER — MENTHOL 3 MG MT LOZG
1.0000 | LOZENGE | OROMUCOSAL | Status: DC | PRN
  Administered 2022-07-04 – 2022-07-07 (×4): 3 mg via ORAL
  Filled 2022-07-04 (×4): qty 9

## 2022-07-04 MED ORDER — RAMELTEON 8 MG PO TABS
8.0000 mg | ORAL_TABLET | Freq: Every day | ORAL | Status: DC
  Administered 2022-07-04 – 2022-07-09 (×7): 8 mg via ORAL
  Filled 2022-07-04 (×10): qty 1

## 2022-07-04 NOTE — Progress Notes (Addendum)
  Patient reports lower back pain 10/10, rated her anxiety at 10/10, depression 10/10, and hopelessness 9/10. Patient endorses visual hallucinations, stated that "green men are all over here". Patient then stated " I want to wake up and walk in front of the car, I'm tired of depression, I'm tired of pain, I'm tired of anxiety, and seeing these green men". Patient denies auditory hallucinations, denies SI & HI.  PRN Robaxin and hydroxyzine administered, warm compress provided. Emotional support provided to patient. Patient verbally contracts for safety. Q 15 minutes safety checks in place.    07/03/22 2020  Psych Admission Type (Psych Patients Only)  Admission Status Voluntary  Psychosocial Assessment  Patient Complaints Anxiety;Depression;Hopelessness;Insomnia  Eye Contact Fair  Facial Expression Worried  Affect Appropriate to circumstance  Speech Logical/coherent  Interaction Assertive  Motor Activity Slow  Appearance/Hygiene Unremarkable  Behavior Characteristics Appropriate to situation  Mood Depressed  Thought Process  Coherency WDL  Content Paranoia  Delusions Paranoid  Perception WDL  Hallucination None reported or observed  Judgment Poor  Confusion None  Danger to Self  Current suicidal ideation? Passive  Self-Injurious Behavior No self-injurious ideation or behavior indicators observed or expressed   Agreement Not to Harm Self Yes  Description of Agreement Verbal contract  Danger to Others  Danger to Others None reported or observed

## 2022-07-04 NOTE — Group Note (Addendum)
Recreation Therapy Group Note   Group Topic:Animal Assisted Therapy   Group Date: 07/04/2022 Start Time: [id number] End Time: 1033 Facilitators: Landynn Dupler-McCall, LRT,CTRS Location: [address] Dayroom   Animal-Assisted Activity (AAA) Program Checklist/Progress Notes Patient Eligibility Criteria Checklist & Daily Group note for Rec Tx Intervention  AAA/T Program Assumption of Risk Form signed by Patient/ or Parent Legal Guardian Yes  Patient understands his/her participation is voluntary Yes   Affect/Mood: N/A   Participation Level: Did not attend    Clinical Observations/Individualized Feedback:     Plan: Continue to engage patient in RT group sessions 2-3x/week.   Eric Nees-McCall, LRT,CTRS 07/04/2022 2:17 PM

## 2022-07-04 NOTE — Progress Notes (Signed)
   07/04/22 0600  15 Minute Checks  Location Bedroom  Visual Appearance Calm  Behavior Sleeping  Sleep (Behavioral Health Patients Only)  Calculate sleep? (Click Yes once per 24 hr at 0600 safety check) Yes  Documented sleep last 24 hours 8

## 2022-07-04 NOTE — Group Note (Unsigned)
Date:  07/04/2022 Time:  4:16 PM  Group Topic/Focus:  Goals Group:   The focus of this group is to help patients establish daily goals to achieve during treatment and discuss how the patient can incorporate goal setting into their daily lives to aide in recovery. Orientation:   The focus of this group is to educate the patient on the purpose and policies of crisis stabilization and provide a format to answer questions about their admission.  The group details unit policies and expectations of patients while admitted.     Participation Level:  {BHH PARTICIPATION HD:[id number]  Participation Quality:  {BHH PARTICIPATION QUALITY:22265}  Affect:  {BHH AFFECT:22266}  Cognitive:  {BHH COGNITIVE:22267}  Insight: {BHH Insight2:20797}  Engagement in Group:  {BHH ENGAGEMENT IN JY:[id number]  Modes of Intervention:  {BHH MODES OF INTERVENTION:22269}  Additional Comments:  ***  Garvin Fila 07/04/2022, 4:16 PM

## 2022-07-04 NOTE — BHH Group Notes (Signed)
Spiritual care group on grief and loss facilitated by Chaplain Katy Catharina Pica, Bcc and Colleen Pesci, counseling intern.  Group Goal: Support / Education around grief and loss  Members engage in facilitated group support and psycho-social education.  Group Description:  Following introductions and group rules, group members engaged in facilitated group dialogue and support around topic of loss, with particular support around experiences of loss in their lives. Group Identified types of loss (relationships / self / things) and identified patterns, circumstances, and changes that precipitate losses. Reflected on thoughts / feelings around loss, normalized grief responses, and recognized variety in grief experience. Group encouraged individual reflection on safe space and on the coping skills that they are already utilizing.  Group drew on Adlerian / Rogerian and narrative framework  Patient Progress: Did not attend.  

## 2022-07-04 NOTE — BHH Group Notes (Signed)
The focus of this group is to help patients review their daily goal of treatment and discuss progress on daily workbooks. Pt was not present during tonight's wrap up group.  

## 2022-07-04 NOTE — Progress Notes (Signed)
   07/04/22 2331  Psych Admission Type (Psych Patients Only)  Admission Status Voluntary  Psychosocial Assessment  Patient Complaints Depression;Anxiety  Eye Contact Fair  Facial Expression Worried  Affect Appropriate to circumstance  Speech Logical/coherent  Interaction Assertive  Motor Activity Slow  Appearance/Hygiene Unremarkable  Behavior Characteristics Cooperative  Mood Pleasant  Thought Process  Coherency WDL  Content WDL  Delusions None reported or observed  Perception WDL  Hallucination None reported or observed  Judgment Impaired  Confusion None  Danger to Self  Current suicidal ideation? Denies  Self-Injurious Behavior No self-injurious ideation or behavior indicators observed or expressed   Agreement Not to Harm Self Yes  Description of Agreement VERBAL CONTRACT FOR SAFETY  Danger to Others  Danger to Others None reported or observed   D: Patient in her room most of the evening. Pt reports she had a good day and  able to tolerate medications well. A: Medications administered as prescribed. Support and encouragement provided as needed.  R: Patient remains safe on the unit. Plan of care ongoing for safety and stability.

## 2022-07-04 NOTE — Progress Notes (Addendum)
D. Pt has been calm and cooperative, pleasant during interactions. Pt complained of sore throat and malaise this am, but has since resolved.  Pt has been visible in the milieu, observed attending group. Pt continues to endorse passive SI, but no plan. Pt currently denies HI and AVH and agrees to contact staff before acting on any harmful thoughts.  A. Labs and vitals monitored. Pt given and educated on medications. Pt supported emotionally and encouraged to express concerns and ask questions.   R. Pt remains safe with 15 minute checks. Will continue POC.    07/04/22 1800  Psych Admission Type (Psych Patients Only)  Admission Status Voluntary  Psychosocial Assessment  Patient Complaints Depression  Eye Contact Fair  Facial Expression Sad;Worried  Affect Appropriate to circumstance  Speech Logical/coherent  Interaction Assertive  Motor Activity Slow  Appearance/Hygiene Unremarkable  Behavior Characteristics Appropriate to situation;Cooperative  Mood Sad;Pleasant  Thought Process  Coherency WDL  Content WDL  Delusions None reported or observed  Perception WDL  Hallucination None reported or observed  Judgment Poor  Confusion None  Danger to Self  Current suicidal ideation? Passive  Self-Injurious Behavior No self-injurious ideation or behavior indicators observed or expressed   Agreement Not to Harm Self Yes  Description of Agreement verbal contract  Danger to Others  Danger to Others None reported or observed

## 2022-07-04 NOTE — Progress Notes (Signed)
[facility] MD Progress Note  07/04/2022 12:25 PM Kathryn [name]  MRN:  [id number]  Subjective:   Patient is a 52 year old female with a psychiatric history of cocaine mood disorder versus MDD versus bipolar disorder versus schizoaffective disorder, who was admitted to the psychiatric unit for suicidal thoughts in the setting of relapse on cocaine medication nonadherence.  Yesterday the psychiatry team made the following recommendations: Start Robaxin Continue Cymbalta 60 mg once daily Increase Latuda from 40 to 60 mg once daily with breakfast Start clonidine as needed for hypertension with parameters  On my assessment today, patient is laying in her bed in the dark.  She reports she feels very tired "and sick" and reports having cough.  Denies any fever or chills.  Reports having sore throat.  Discussed she must remain isolated to her room and we will give respiratory panel and COVID testing.  She reports that her mood is still depressed and dysphoric.  Reports anxiety level is high.  Reports poor sleep last night.  Reports low appetite.  Reports poor concentration.  Reports continued suicidal thoughts without intent in the hospital, but reports she would jump in front of a car if she were discharged in the hospital.  Denies any HI.  Reports auditory and visual hallucinations, unchanged since yesterday.  Denies any side effects from current psychiatric medications.  Principal Problem: Schizoaffective disorder, bipolar type ([place]) Diagnosis: Principal Problem:   Schizoaffective disorder, bipolar type ([place]) Active Problems:   GAD (generalized anxiety disorder)   Cocaine use disorder ([place])   Homelessness  Total Time spent with patient: 15 minutes  Past Psychiatric History:  Previous Psych Diagnoses: MDD, cocaine-induced mood disorder, polysubstance abuse, schizoaffective disorder bipolar type Prior inpatient treatment: She was hospitalized at [facility] from 10/17/21 to 12/07/21; & 01/09/19 23-10 /04/2021. She  has also been admitted to [facility] last year; Current/prior outpatient treatment: She receives her current outpatient psychiatric care from [facility] in [place].  Psychotherapy hx: unknown History of suicide: previous attempt September 2020 by consuming bleach and vinegar, Yes, 2019 patient cut wrist, 2023 patient drank Clorox and vinegar, 2024 patient drank Clorox. History of homicide: none Psychiatric medication history: Duloxetine 30 mg, Benztropine 0.5 mg, citalopram 20 mg,  Psychiatric medication compliance history: poor compliance Prior inpatient treatment: Yes, see above Current/prior outpatient treatment: Yes Prior rehab hx: Patient denies Psychiatric medication history: Yes Neuromodulation history: Denies Current Psychiatrist: Dr.  Theophilus Bones Current therapist: Patient denies  Past Medical History:  Past Medical History:  Diagnosis Date   Anxiety    Arthritis    Asthma    Bipolar disorder ([place])    Chronic back pain    COPD (chronic obstructive pulmonary disease) (HCC)    Chronic bronchitis   Depression    Dyspnea    GERD (gastroesophageal reflux disease)    Hypertension    Migraine    Neuropathy    Osteoarthritis of left knee, 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   Single subsegmental pulmonary embolism without acute cor pulmonale ([place]) 06/20/2021   Sleep apnea 04/2020   GETTING A cpap   Suicidal ideation 12/24/2018    Past Surgical History:  Procedure Laterality Date   BACK SURGERY     DILATATION AND CURETTAGE/HYSTEROSCOPY WITH MINERVA N/A 06/09/2020   Procedure: DILATATION AND CURETTAGE/HYSTEROSCOPY WITH MINERVA;  Surgeon: Florian Buff, MD;  Location: [facility] ORS;  Service: Gynecology;  Laterality: N/A;   ESOPHAGOGASTRODUODENOSCOPY (EGD) WITH PROPOFOL N/A 12/25/2018   Procedure: ESOPHAGOGASTRODUODENOSCOPY (EGD) WITH PROPOFOL;  Surgeon: Rogene Houston, MD;  Location: [facility] END[facility] SUITE;  Service: Endoscopy;  Laterality: N/A;   FLEXIBLE SIGMOIDOSCOPY  10/14/2021   Procedure:  FLEXIBLE SIGMOIDOSCOPY;  Surgeon: Harvel Quale, MD;  Location: [facility] END[facility] SUITE;  Service: Gastroenterology;;   PATELLA-FEMORAL ARTHROPLASTY Left 10/08/2018   Procedure: PATELLA-FEMORAL ARTHROPLASTY;  Surgeon: Marchia Bond, MD;  Location: [facility] ORS;  Service: Orthopedics;  Laterality: Left;   TUBAL LIGATION     Family History:  Family History  Problem Relation Age of Onset   Gout Paternal Grandfather    Cirrhosis Paternal Grandfather    Hypertension Paternal Grandmother    Aneurysm Paternal Grandmother    Cirrhosis Maternal Grandmother    Cirrhosis Maternal Grandfather    Cancer Father    Cirrhosis Father    Cirrhosis Mother    Breast cancer Sister    Hypertension Sister    Bronchitis Daughter    Bronchitis Daughter    Asthma Son    Bronchitis Son    Migraines Neg Hx    Family Psychiatric  History: See H&P  Social History:  Social History   Substance and Sexual Activity  Alcohol Use Not Currently   Comment: started drinking again last night while at a party as of 06/29/22     Social History   Substance and Sexual Activity  Drug Use Yes   Types: Cocaine    Social History   Socioeconomic History   Marital status: Single    Spouse name: Not on file   Number of children: 3   Years of education: 10   Highest education level: 10th grade  Occupational History   Not on file  Tobacco Use   Smoking status: Every Day    Packs/day: 0.25    Years: 10.00    Additional pack years: 0.00    Total pack years: 2.50    Types: Cigarettes    Last attempt to quit: 04/2020    Years since quitting: 2.2    Passive exposure: Never   Smokeless tobacco: Never   Tobacco comments:    Smokes often, especially when using crack cocaine  Vaping Use   Vaping Use: Never used  Substance and Sexual Activity   Alcohol use: Not Currently    Comment: started drinking again last night while at a party as of 06/29/22   Drug use: Yes    Types: Cocaine   Sexual activity: Yes    Birth  control/protection: Surgical    Comment: tubal, ablation  Other Topics Concern   Not on file  Social History Narrative   R handed    Lives with boyfriend   1 Cup of caffeine daily    Social Determinants of Health   Financial Resource Strain: Not on file  Food Insecurity: Food Insecurity Present (06/30/2022)   Hunger Vital Sign    Worried About Running Out of Food in the Last Year: Often true    Ran Out of Food in the Last Year: Often true  Transportation Needs: Unmet Transportation Needs (06/30/2022)   PRAPARE - Hydrologist (Medical): Yes    Lack of Transportation (Non-Medical): Yes  Physical Activity: Not on file  Stress: Not on file  Social Connections: Not on file   Additional Social History:                           Current Medications: Current Facility-Administered Medications  Medication Dose Route Frequency Provider Last Rate Last Admin   albuterol (PROVENTIL) (  2.5 MG/3ML) 0.083% nebulizer solution 2.5 mg  2.5 mg Nebulization Q6H PRN Lucky Rathke, FNP       alum & mag hydroxide-simeth (MAALOX/MYLANTA) 200-200-20 MG/5ML suspension 30 mL  30 mL Oral Q4H PRN Lucky Rathke, FNP   30 mL at 07/02/22 1435   amLODipine (NORVASC) tablet 5 mg  5 mg Oral Daily Lucky Rathke, FNP   5 mg at 07/04/22 0737   cholecalciferol (VITAMIN D3) 25 MCG (1000 UNIT) tablet 1,000 Units  1,000 Units Oral Daily Lucky Rathke, FNP   1,000 Units at 07/04/22 0737   cloNIDine (CATAPRES) tablet 0.1 mg  0.1 mg Oral Q4H PRN Lyndi Holbein, Ovid Curd, MD       diphenhydrAMINE (BENADRYL) capsule 50 mg  50 mg Oral TID PRN Lucky Rathke, FNP       Or   diphenhydrAMINE (BENADRYL) injection 50 mg  50 mg Intramuscular TID PRN Lucky Rathke, FNP       DULoxetine (CYMBALTA) DR capsule 60 mg  60 mg Oral Daily Lucky Rathke, FNP   60 mg at 07/04/22 0737   fluticasone furoate-vilanterol (BREO ELLIPTA) 200-25 MCG/ACT 1 puff  1 puff Inhalation Daily Lucky Rathke, FNP   1 puff at  07/04/22 [id number]   haloperidol (HALDOL) tablet 5 mg  5 mg Oral TID PRN Lucky Rathke, FNP       Or   haloperidol lactate (HALDOL) injection 5 mg  5 mg Intramuscular TID PRN Lucky Rathke, FNP       hydrOXYzine (ATARAX) tablet 100 mg  100 mg Oral Q6H PRN Wyeth Hoffer, Ovid Curd, MD   100 mg at 07/03/22 2021   lidocaine (LIDODERM) 5 % 1 patch  1 patch Transdermal Q24H France Ravens, MD   1 patch at 07/03/22 0954   LORazepam (ATIVAN) tablet 2 mg  2 mg Oral TID PRN Lucky Rathke, FNP       Or   LORazepam (ATIVAN) injection 2 mg  2 mg Intramuscular TID PRN Lucky Rathke, FNP       lurasidone (LATUDA) tablet 60 mg  60 mg Oral Q breakfast Ntuen, Tina C, FNP   60 mg at 07/04/22 0745   magnesium hydroxide (MILK OF MAGNESIA) suspension 30 mL  30 mL Oral Daily PRN Lucky Rathke, FNP   30 mL at 07/02/22 0800   menthol-cetylpyridinium (CEPACOL) lozenge 3 mg  1 lozenge Oral PRN Lometa Riggin, Ovid Curd, MD       methocarbamol (ROBAXIN) tablet 500 mg  500 mg Oral Q8H PRN Lucrecia Mcphearson, Ovid Curd, MD   500 mg at 07/03/22 2020   nicotine (NICODERM CQ - dosed in mg/24 hours) patch 14 mg  14 mg Transdermal Daily Deklen Popelka, Ovid Curd, MD   14 mg at 07/04/22 0740   pantoprazole (PROTONIX) EC tablet 40 mg  40 mg Oral Daily France Ravens, MD   40 mg at 07/04/22 0740   pregabalin (LYRICA) capsule 150 mg  150 mg Oral BID Lucky Rathke, FNP   150 mg at 07/04/22 0741   ramelteon (ROZEREM) tablet 8 mg  8 mg Oral QHS Evynn Boutelle, Ovid Curd, MD        Lab Results: No results found for this or any previous visit (from the past 48 hour(s)).  Blood Alcohol level:  Lab Results  Component Value Date   [facility] <10 06/29/2022   ETH <10 [id number]    Metabolic Disorder Labs: Lab Results  Component Value Date   HGBA1C 5.5 01/10/2022   MPG  111.15 01/10/2022   MPG 111.15 10/19/2021   No results found for: "PROLACTIN" Lab Results  Component Value Date   CHOL 168 01/10/2022   TRIG 56 01/10/2022   HDL 57 01/10/2022   CHOLHDL 2.9 01/10/2022   VLDL 11  01/10/2022   LDLCALC 100 (H) 01/10/2022   LDLCALC 84 10/19/2021    Physical Findings: AIMS:  , ,  ,  ,    CIWA:    COWS:     Musculoskeletal: Strength & Muscle Tone: Lying in bed Gait & Station: Lying in bed Patient leans: Lying in bed  Psychiatric Specialty Exam:  Presentation  General Appearance:  Disheveled  Eye Contact: Poor  Speech: Slow  Speech Volume: Decreased  Handedness: Right   Mood and Affect  Mood: Anxious; Depressed; Worthless  Affect: Congruent; Depressed; Constricted   Thought Process  Thought Processes: Linear  Descriptions of Associations:Intact  Orientation:Full (Time, Place and Person)  Thought Content:Logical  History of Schizophrenia/Schizoaffective disorder:Yes  Duration of Psychotic Symptoms:Greater than six months  Hallucinations:Hallucinations: Auditory; Visual  Ideas of Reference:None  Suicidal Thoughts:Suicidal Thoughts: Yes, Active SI Active Intent and/or Plan: Without Intent; With Plan SI Passive Intent and/or Plan: Without Intent; Without Plan; Without Means to Carry Out  Homicidal Thoughts:Homicidal Thoughts: No HI Active Intent and/or Plan: -- (Denies)   Sensorium  Memory: Immediate Fair; Recent Fair; Remote Fair  Judgment: Impaired  Insight: Lacking   Executive Functions  Concentration: Fair  Attention Span: Fair  Recall: Good  Fund of Knowledge: Good  Language: Good   Psychomotor Activity  Psychomotor Activity: Psychomotor Activity: Normal   Assets  Assets: Communication Skills; Desire for Improvement; Physical Health   Sleep  Sleep: Sleep: Poor Number of Hours of Sleep: 8    Physical Exam: Physical Exam Vitals reviewed.  Pulmonary:     Effort: Pulmonary effort is normal.  Neurological:     Mental Status: She is alert.     Motor: Weakness present.     Gait: Gait abnormal.    Review of Systems  Constitutional:  Positive for malaise/fatigue. Negative for chills  and fever.  Cardiovascular:  Negative for chest pain and palpitations.  Neurological:  Negative for dizziness, tingling, tremors and headaches.  Psychiatric/Behavioral:  Positive for depression, hallucinations, substance abuse and suicidal ideas. The patient is nervous/anxious and has insomnia.    Blood pressure 123/78, pulse 96, temperature 98.3 F (36.8 C), temperature source Oral, resp. rate 16, height 4\' 11"  (1.499 m), weight 86.6 kg, last menstrual period 06/07/2020, SpO2 98 %. Body mass index is 38.58 kg/m.   Treatment Plan Summary: Daily contact with patient to assess and evaluate symptoms and progress in treatment and Medication management  Assessment: Schizoaffective disorder bipolar type Stimulant and use disorder cocaine type GAD     PLAN: Safety and Monitoring:  --  Voluntary admission to inpatient psychiatric unit for safety, stabilization and treatment  -- Daily contact with patient to assess and evaluate symptoms and progress in treatment  -- Patient's case to be discussed in multi-disciplinary team meeting  -- Observation Level : q15 minute checks  -- Vital signs:  q12 hours  -- Precautions: suicide, elopement, and assault  2. Psychiatric Diagnoses and Treatment:    Continue Cymbalta 60 mg once daily for bipolar depression and GAD Continue Latuda 60 mg once daily with breakfast for bipolar depression Start Rozerem 8 mg once nightly for insomnia (patient has allergy to trazodone, and we would like to avoid adding a second antipsychotic for insomnia and  mood stabilization) Continue Lyrica 150 mg twice daily for neuropathy  Continued Norvasc for hypertension Continue Protonix for GERD  --  The risks/benefits/side-effects/alternatives to this medication were discussed in detail with the patient and time was given for questions. The patient consents to medication trial.    -- Metabolic profile and EKG monitoring obtained while on an atypical antipsychotic (BMI:  Lipid Panel: HbgA1c: QTc:)   -- Encouraged patient to participate in unit milieu and in scheduled group therapies      3. Medical Issues Being Addressed:   Tobacco Use Disorder  -- Nicotine patch 14 mg/24 hours ordered  -- Smoking cessation encouraged  4. Discharge Planning:   -- Social work and case management to assist with discharge planning and identification of hospital follow-up needs prior to discharge  -- Estimated LOS: 3-4 more days  -- Discharge Concerns: Need to establish a safety plan; Medication compliance and effectiveness  -- Discharge Goals: Return home with outpatient referrals for mental health follow-up including medication management/psychotherapy    Christoper Allegra, MD 07/04/2022, 12:25 PM  Total Time Spent in Direct Patient Care:  I personally spent 35 minutes on the unit in direct patient care. The direct patient care time included face-to-face time with the patient, reviewing the patient's chart, communicating with other professionals, and coordinating care. Greater than 50% of this time was spent in counseling or coordinating care with the patient regarding goals of hospitalization, psycho-education, and discharge planning needs.   Janine Limbo, MD Psychiatrist

## 2022-07-05 NOTE — Group Note (Signed)
Recreation Therapy Group Note   Group Topic:Team Building  Group Date: 07/05/2022 Start Time: 0935 End Time: 1007 Facilitators: Atharv Barriere-McCall, LRT,CTRS Location: [address] Dayroom   Goal Area(s) Addresses:  Patient will effectively work with peer towards shared goal.  Patient will identify skills used to make activity successful.  Patient will identify how skills used during activity can be used to reach post d/c goals.    Group Description: Landing Pad. In teams of 3-5, patients were given 12 plastic drinking straws and an equal length of masking tape. Using the materials provided, patients were asked to build a landing pad to catch a golf ball dropped from approximately 5 feet in the air. All materials were required to be used by the team in their design. LRT facilitated post-activity discussion.   Affect/Mood: N/A   Participation Level: Did not attend    Clinical Observations/Individualized Feedback:      Plan: Continue to engage patient in RT group sessions 2-3x/week.   Nelma Phagan-McCall, LRT,CTRS 07/05/2022 12:39 PM

## 2022-07-05 NOTE — [facility] Group Notes (Signed)
Pt did not attend NA

## 2022-07-05 NOTE — Progress Notes (Signed)
   07/05/22 2246  Psych Admission Type (Psych Patients Only)  Admission Status Voluntary  Psychosocial Assessment  Patient Complaints Depression  Eye Contact Brief  Facial Expression Sad  Affect Depressed  Speech Logical/coherent  Interaction Minimal  Motor Activity Slow  Appearance/Hygiene Unremarkable  Behavior Characteristics Cooperative  Mood Pleasant  Thought Process  Coherency WDL  Content WDL  Delusions None reported or observed  Perception WDL  Hallucination None reported or observed  Judgment Poor  Confusion None  Danger to Self  Current suicidal ideation? Denies  Agreement Not to Harm Self Yes  Description of Agreement VERBAL CONTRACT FOR SAFETY  Danger to Others  Danger to Others None reported or observed

## 2022-07-05 NOTE — Progress Notes (Signed)
[facility] MD Progress Note  07/05/2022 5:45 PM NESHELL LOLLAR  MRN:  [id number]  Principal Problem: Schizoaffective disorder, bipolar type ([place])  Diagnosis: Principal Problem:   Schizoaffective disorder, bipolar type ([place]) Active Problems:   GAD (generalized anxiety disorder)   Cocaine use disorder ([place])   Homelessness  Reason for Admission: Kathryn [name] is a 52 y.o. female w/ hx of cocaine induced mood disorder, MDD who presented to [facility] voluntarily from [place] due to [place] in setting of cocaine abuse.  This is hospitalization day 5.  24-hour chart review: Past 24 hours of patient's chart was reviewed.  Patient is compliant with scheduled meds. Required Agitation PRNs: none Per RN notes, no documented behavioral issues and is not attending group regularly. Patient slept, 8 hours  Daily notes: Kathryn [name] is seen in her room. Char reviewed. The chart findings discussed with the treatment team. She presents alert, oriented & aware of situation. She is visible on the unit mostly during meal/medication administration times. Her affect is flat. She says she is still feeling depressed & suicidal. She says her depression is due to her current homeless situation which led her to use cocaine to cope. She is also complaining of neuropathic pain & walks with a walker. She says she is doing well on her medications, denies any side effects. Staff reports that patient requires a lot of encouragement to come out of her room to attend group sessions. She is currently being encouraged to come out of her room & attend group sessions. The treatment team has met, discussed this case. It was jointly agreed that if by tomorrow & patient is still not coming out of her room to attend group sessions, her room will be locked to keep her from isolating in her room. Although denies any HI, AVH, patient continues to endorse SI with out any plan. She is also endorsing poor sleep last night. She has been ordered Rozerem 8 mg po Q hs for  sleep. She rates her depression #10 to day & anxiety #10. See current plan of care below & follow as recommended.   Total Time spent with patient:  25 minutes  Past Psychiatric History:  Previous Psych Diagnoses: MDD, cocaine-induced mood disorder, polysubstance abuse, schizoaffective disorder bipolar type Prior inpatient treatment: She was hospitalized at [facility] from 10/17/21 to 12/07/21; & 01/09/19 23-10 /04/2021. She has also been admitted to [facility] [place] [facility] last year; Current/prior outpatient treatment: She receives her current outpatient psychiatric care from [facility] in [place].  Psychotherapy hx: unknown History of suicide: previous attempt September 2020 by consuming bleach and vinegar, Yes, 2019 patient cut wrist, 2023 patient drank Clorox and vinegar, 2024 patient drank Clorox. History of homicide: none Psychiatric medication history: Duloxetine 30 mg, Benztropine 0.5 mg, citalopram 20 mg,  Psychiatric medication compliance history: poor compliance Prior inpatient treatment: Yes, see above Current/prior outpatient treatment: Yes Prior rehab hx: Patient denies Psychiatric medication history: Yes Neuromodulation history: Denies Current Psychiatrist: Dr.  Theophilus Bones Current therapist: Patient denies  Past Medical History:  Past Medical History:  Diagnosis Date   Anxiety    Arthritis    Asthma    Bipolar disorder ([place])    Chronic back pain    COPD (chronic obstructive pulmonary disease) (HCC)    Chronic bronchitis   Depression    Dyspnea    GERD (gastroesophageal reflux disease)    Hypertension    Migraine    Neuropathy    Osteoarthritis of left knee, 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   Single subsegmental  pulmonary embolism without acute cor pulmonale ([place]) 06/20/2021   Sleep apnea 04/2020   GETTING A cpap   Suicidal ideation 12/24/2018    Past Surgical History:  Procedure Laterality Date   BACK SURGERY     DILATATION AND CURETTAGE/HYSTEROSCOPY WITH MINERVA N/A 06/09/2020    Procedure: DILATATION AND CURETTAGE/HYSTEROSCOPY WITH MINERVA;  Surgeon: Florian Buff, MD;  Location: [facility] OR[facility];  Service: Gynecology;  Laterality: N/A;   ESOPHAGOGASTRODUODENOSCOPY (EGD) WITH PROPOFOL N/A 12/25/2018   Procedure: ESOPHAGOGASTRODUODENOSCOPY (EGD) WITH PROPOFOL;  Surgeon: Rogene Houston, MD;  Location: [facility] END[facility] SUITE;  Service: Endoscopy;  Laterality: N/A;   FLEXIBLE SIGMOIDOSCOPY  10/14/2021   Procedure: FLEXIBLE SIGMOIDOSCOPY;  Surgeon: Harvel Quale, MD;  Location: [facility] ENDO SUITE;  Service: Gastroenterology;;   PATELLA-FEMORAL ARTHROPLASTY Left 10/08/2018   Procedure: PATELLA-FEMORAL ARTHROPLASTY;  Surgeon: Marchia Bond, MD;  Location: [facility] ORS;  Service: Orthopedics;  Laterality: Left;   TUBAL LIGATION     Family History:  Family History  Problem Relation Age of Onset   Gout Paternal Grandfather    Cirrhosis Paternal Grandfather    Hypertension Paternal Grandmother    Aneurysm Paternal Grandmother    Cirrhosis Maternal Grandmother    Cirrhosis Maternal Grandfather    Cancer Father    Cirrhosis Father    Cirrhosis Mother    Breast cancer Sister    Hypertension Sister    Bronchitis Daughter    Bronchitis Daughter    Asthma Son    Bronchitis Son    Migraines Neg Hx    Social History:  Social History   Substance and Sexual Activity  Alcohol Use Not Currently   Comment: started drinking again last night while at a party as of 06/29/22     Social History   Substance and Sexual Activity  Drug Use Yes   Types: Cocaine    Social History   Socioeconomic History   Marital status: Single    Spouse name: Not on file   Number of children: 3   Years of education: 10   Highest education level: 10th grade  Occupational History   Not on file  Tobacco Use   Smoking status: Every Day    Packs/day: 0.25    Years: 10.00    Additional pack years: 0.00    Total pack years: 2.50    Types: Cigarettes    Last attempt to quit: 04/2020    Years since  quitting: 2.2    Passive exposure: Never   Smokeless tobacco: Never   Tobacco comments:    Smokes often, especially when using crack cocaine  Vaping Use   Vaping Use: Never used  Substance and Sexual Activity   Alcohol use: Not Currently    Comment: started drinking again last night while at a party as of 06/29/22   Drug use: Yes    Types: Cocaine   Sexual activity: Yes    Birth control/protection: Surgical    Comment: tubal, ablation  Other Topics Concern   Not on file  Social History Narrative   R handed    Lives with boyfriend   1 Cup of caffeine daily    Social Determinants of Health   Financial Resource Strain: Not on file  Food Insecurity: Food Insecurity Present (06/30/2022)   Hunger Vital Sign    Worried About Running Out of Food in the Last Year: Often true    Ran Out of Food in the Last Year: Often true  Transportation Needs: Unmet Transportation Needs (06/30/2022)  PRAPARE - Hydrologist (Medical): Yes    Lack of Transportation (Non-Medical): Yes  Physical Activity: Not on file  Stress: Not on file  Social Connections: Not on file   Additional Social History:    Current Medications: Current Facility-Administered Medications  Medication Dose Route Frequency Provider Last Rate Last Admin   albuterol (PROVENTIL) (2.5 MG/3ML) 0.083% nebulizer solution 2.5 mg  2.5 mg Nebulization Q6H PRN Lucky Rathke, FNP       alum & mag hydroxide-simeth (MAALOX/MYLANTA) 200-200-20 MG/5ML suspension 30 mL  30 mL Oral Q4H PRN Lucky Rathke, FNP   30 mL at 07/02/22 1435   amLODipine (NORVASC) tablet 5 mg  5 mg Oral Daily Lucky Rathke, FNP   5 mg at 07/05/22 0846   cholecalciferol (VITAMIN D3) 25 MCG (1000 UNIT) tablet 1,000 Units  1,000 Units Oral Daily Lucky Rathke, FNP   1,000 Units at 07/05/22 0846   cloNIDine (CATAPRES) tablet 0.1 mg  0.1 mg Oral Q4H PRN Massengill, Ovid Curd, MD       diphenhydrAMINE (BENADRYL) capsule 50 mg  50 mg Oral TID PRN Lucky Rathke, FNP       Or   diphenhydrAMINE (BENADRYL) injection 50 mg  50 mg Intramuscular TID PRN Lucky Rathke, FNP       DULoxetine (CYMBALTA) DR capsule 60 mg  60 mg Oral Daily Lucky Rathke, FNP   60 mg at 07/05/22 0846   fluticasone furoate-vilanterol (BREO ELLIPTA) 200-25 MCG/ACT 1 puff  1 puff Inhalation Daily Lucky Rathke, FNP   1 puff at 07/05/22 0851   haloperidol (HALDOL) tablet 5 mg  5 mg Oral TID PRN Lucky Rathke, FNP       Or   haloperidol lactate (HALDOL) injection 5 mg  5 mg Intramuscular TID PRN Lucky Rathke, FNP       hydrOXYzine (ATARAX) tablet 100 mg  100 mg Oral Q6H PRN Massengill, Ovid Curd, MD   100 mg at 07/04/22 2141   lidocaine (LIDODERM) 5 % 1 patch  1 patch Transdermal Q24H France Ravens, MD   1 patch at 07/05/22 1609   LORazepam (ATIVAN) tablet 2 mg  2 mg Oral TID PRN Lucky Rathke, FNP       Or   LORazepam (ATIVAN) injection 2 mg  2 mg Intramuscular TID PRN Lucky Rathke, FNP       lurasidone (LATUDA) tablet 60 mg  60 mg Oral Q breakfast Ntuen, Tina C, FNP   60 mg at 07/05/22 0846   magnesium hydroxide (MILK OF MAGNESIA) suspension 30 mL  30 mL Oral Daily PRN Lucky Rathke, FNP   30 mL at 07/02/22 0800   menthol-cetylpyridinium (CEPACOL) lozenge 3 mg  1 lozenge Oral PRN Massengill, Ovid Curd, MD   3 mg at 07/05/22 0850   methocarbamol (ROBAXIN) tablet 500 mg  500 mg Oral Q8H PRN Massengill, Ovid Curd, MD   500 mg at 07/05/22 1720   nicotine (NICODERM CQ - dosed in mg/24 hours) patch 14 mg  14 mg Transdermal Daily Massengill, Ovid Curd, MD   14 mg at 07/05/22 0849   pantoprazole (PROTONIX) EC tablet 40 mg  40 mg Oral Daily France Ravens, MD   40 mg at 07/05/22 0846   pregabalin (LYRICA) capsule 150 mg  150 mg Oral BID Lucky Rathke, FNP   150 mg at 07/05/22 1720   ramelteon (ROZEREM) tablet 8 mg  8 mg Oral QHS Massengill,  Ovid Curd, MD   8 mg at 07/04/22 2143   Lab Results:  No results found for this or any previous visit (from the past 24 hour(s)).   Blood Alcohol level:  Lab  Results  Component Value Date   ETH <10 06/29/2022   ETH <10 [id number]   Metabolic Disorder Labs: Lab Results  Component Value Date   HGBA1C 5.5 01/10/2022   MPG 111.15 01/10/2022   MPG 111.15 10/19/2021   No results found for: "PROLACTIN" Lab Results  Component Value Date   CHOL 168 01/10/2022   TRIG 56 01/10/2022   HDL 57 01/10/2022   CHOLHDL 2.9 01/10/2022   VLDL 11 01/10/2022   LDLCALC 100 (H) 01/10/2022   LDLCALC 84 10/19/2021    Physical Findings:  Musculoskeletal: Strength & Muscle Tone: within normal limits Gait & Station: normal  Psychiatric Specialty Exam:  Presentation  General Appearance:  Disheveled  Eye Contact: Poor  Speech: Slow  Speech Volume: Decreased  Handedness: Right  Mood and Affect  Mood: Anxious; Depressed; Worthless  Affect: Congruent; Depressed; Constricted  Thought Process  Thought Processes: Linear  Descriptions of Associations:Intact  Orientation:Full (Time, Place and Person)  Thought Content:Logical  History of Schizophrenia/Schizoaffective disorder:Yes  Duration of Psychotic Symptoms:Greater than six months  Hallucinations:Hallucinations: Auditory; Visual  Ideas of Reference:None  Suicidal Thoughts:Suicidal Thoughts: Yes, Active SI Active Intent and/or Plan: Without Intent; With Plan  Homicidal Thoughts:Homicidal Thoughts: No  Sensorium  Memory: Immediate Fair; Recent Fair; Remote Fair  Judgment: Impaired  Insight: Lacking  Executive Functions  Concentration: Fair  Attention Span: Fair  Recall: Good  Fund of Knowledge: Good  Language: Good  Psychomotor Activity  Psychomotor Activity:Psychomotor Activity: Normal   Assets  Assets: Communication Skills; Desire for Improvement; Physical Health  Sleep  Sleep:Sleep: Poor  Physical Exam: Review of Systems  Constitutional: Negative.   HENT: Negative.    Eyes: Negative.   Respiratory:  Negative for shortness of breath.    Cardiovascular: Negative.  Negative for chest pain.  Gastrointestinal:  Negative for abdominal pain, constipation, diarrhea, heartburn, nausea and vomiting.  Genitourinary: Negative.   Musculoskeletal:  Positive for back pain (s/p back surgery).  Neurological:  Negative for headaches.  Endo/Heme/Allergies: Negative.   Psychiatric/Behavioral:  Positive for depression, hallucinations, substance abuse and suicidal ideas. The patient is nervous/anxious and has insomnia.    Blood pressure 119/77, pulse 93, temperature 98.3 F (36.8 C), temperature source Oral, resp. rate 16, height 4\' 11"  (1.499 m), weight 86.6 kg, last menstrual period 06/07/2020, SpO2 98 %. Body mass index is 38.58 kg/m.   ASSESSMENT AND PLAN Charlea a Hargus is a 52 year old African-American homeless female with prior psychiatric history significant for schizophrenia, 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  Patient presented to [facility] from [facility], ED hospital for worsening depression resulting in suicidal ideations in the context of cocaine use disorder. This is hospitalization day 5.  Patient's mood symptoms predominantly correlated with her cocaine use and housing.  She does not exhibit any psychotic nor depressive symptoms outside of it.  LCSW to provide substance use housing resources to aid with patient's homelessness which seems to be a primary source of her distress and mood symptoms.  PLAN Safety and Monitoring: Voluntary admission to inpatient psychiatric unit for safety, stabilization and treatment Daily contact with patient to assess and evaluate symptoms and progress in treatment Patient's case to be discussed in multi-disciplinary team meeting Observation Level : q15 minute checks Vital signs:  q12 hours Precautions: suicide, elopement, and assault  Psychiatric Problems MDD-Severe, recurrent episode without psychosis Stimulant use  disorder-cocaine Nicotine use disorder Housing Instability.   --Continue Cymbalta DR Capsule 60 mg p.o. daily for depression -- Continue Latuda tablets 60 mg daily with breakfast for bipolar d/o. --Continue clonidine tablet 0.1 mg po Q 4 hours as needed if systolic blood pressure over 99991111 or if diastolic blood pressure over 100 --Continue hydroxyzine 100 mg p.o. every 6 hours as needed for anxiety --Nicotine patch for NRT.  --Continue Rozerem 8 mg po Q hs for insomnia.   Agitation protocols.  --Continue as recommended.  Medical Problems Hypertension Likely also secondary to cocaine use 108/73 today -Amlodipine 5 mg daily  Asthma Recent exacerbation and placed on 5 day course of prednisone on 06/27/22 -Fluticasone furoate-vilanterol 200-25 mcg/ACT 1 puff daily  -Completed 5 day course of prednisone today (07/02/22)  Back Pain -Lyrica 150 mg twice daily -Lidocaine patch trans-dermally for pain.  -Continue Robaxin 500 mg po tid prn for muscle spasms.   GERD.  Continue Protonix 40 mg po Q am for GERD.  PRNs Tylenol 650 mg for mild pain Maalox/Mylanta 30 mL for indigestion Hydroxyzine 25 mg tid for anxiety Milk of Magnesia 30 mL for constipation Trazodone 50 mg for sleep   4. Discharge Planning: Social work and case management to assist with discharge planning and identification of hospital follow-up needs prior to discharge Estimated LOS: 3 days Discharge Concerns: Need to establish a safety plan; Medication compliance and effectiveness Discharge Goals: Return home with outpatient referrals for mental health follow-up including medication management/psychotherapy  Lindell Spar, NP, pmhnp, fnp-bc. 07/05/2022, 5:45 PMPatient ID: Ferdinand Lango, female   DOB: October 13, 1970, 52 y.o.   MRN: [id number] Patient ID: TAHLIYAH DAJANI, female   DOB: 1971-01-22, 52 y.o.   MRN: [id number]

## 2022-07-05 NOTE — Progress Notes (Signed)
Pt requested lidocaine patch be placed on her back in the hallway so she does not have to walk back down to her room. Pt was cooperative and remains safe on Q15 min checks.

## 2022-07-05 NOTE — Progress Notes (Addendum)
   07/05/22 1000  Psych Admission Type (Psych Patients Only)  Admission Status Voluntary  Psychosocial Assessment  Patient Complaints Depression;Anxiety  Eye Contact Brief  Facial Expression Worried;Sad  Affect Depressed  Speech Logical/coherent  Interaction Assertive  Motor Activity Slow  Appearance/Hygiene Unremarkable  Behavior Characteristics Cooperative  Mood Pleasant  Thought Process  Coherency WDL  Content WDL  Delusions None reported or observed  Perception WDL  Hallucination None reported or observed  Judgment Poor  Confusion None  Danger to Self  Current suicidal ideation? Denies  Danger to Others  Danger to Others None reported or observed   Pt denies SI/HI/AVH.  Pt isolative to room for first part of day but is out of her room interacting with peers at this time.  Pt taking medications without incident.  No side effects are noted.

## 2022-07-06 MED ORDER — ONDANSETRON 4 MG PO TBDP
4.0000 mg | ORAL_TABLET | Freq: Three times a day (TID) | ORAL | Status: DC | PRN
  Administered 2022-07-06 – 2022-07-08 (×2): 4 mg via ORAL
  Filled 2022-07-06 (×2): qty 1

## 2022-07-06 NOTE — Progress Notes (Signed)
Patient continues on droplet precautions. This morning patient reported malaise and received Robaxin and zofran po prn. Patient also continues to report chronic back pain which the lidocaine patch has been providing relief. Patient compliant with medications with the exception of her fluticasone and nicotine patch this morning. VS remain WDL and by this afternoon patient reported started to feel better after resting throughout day. Patient denies SI,HI, and A/V/H with no plan or intent. Patient remains free from injuries and cooperative on unit.

## 2022-07-06 NOTE — Plan of Care (Signed)
  Problem: Health Behavior/Discharge Planning: Goal: Compliance with treatment plan for underlying cause of condition will improve Outcome: Progressing   Problem: Safety: Goal: Periods of time without injury will increase Outcome: Progressing   

## 2022-07-06 NOTE — Progress Notes (Signed)
[facility] MD Progress Note  07/06/2022 4:16 PM Kathryn [name]  MRN:  [id number]  Principal Problem: Schizoaffective disorder, bipolar type ([place])  Diagnosis: Principal Problem:   Schizoaffective disorder, bipolar type ([place]) Active Problems:   GAD (generalized anxiety disorder)   Cocaine use disorder ([place])   Homelessness  Reason for Admission: Kathryn [name] is a 52 y.o. female w/ hx of cocaine induced mood disorder, MDD who presented to [facility] voluntarily from [place] due to [place] in setting of cocaine abuse.  This is hospitalization day 6.  24-hour chart review: Past 24 hours of patient's chart was reviewed.  Patient is compliant with scheduled meds. Required Agitation PRNs: none Per RN notes, no documented behavioral issues and is not attending group because she is currently in her room due rhino virus infection. Patient slept, 8 hours  Daily notes: Kathryn [name] is seen in her room. Chart reviewed. The chart findings discussed with the treatment team. She presents alert, oriented & aware of situation. She is not visible on the unit, currently in her room due to rhino virus infection. She is wearing a mask. Continues to use walker to aid her ambulation & balance due to lower extremity pain/weakness. Kathryn [name] reports, "My mood is okay, but I'm hurting a lot to my shoulders & leg areas. I got this Rhino virus, that is the reason I'm in my room right now. I'm still hearing voices telling me to go kill myself. I see little green people running around my bed. I'm still worried about my situation after discharge. I got no place to go to or live after discharge. I got grown children, they got young children. They do not want me around. I could go live with my youngest daughter, but she normally will make me leave her house in the mornings once she gets ready for work. I planned to kill myself once I leave this place still homeless. I cannot survive out there. I have done my share of living in the woods". Kathryn [name] continues  to report SI with plan & intent. She is endorsing AVH. Denies any delusional thoughts or paranoia. She does not appear to be responding to any internal stimuli. She rates her depression #9 & anxiety#10. There are no changes made on her current plan of care. Will continue as already in progress. Patient reports today that she gets $950.00 a month on social security benefit (disability benefit).  Total Time spent with patient:  25 minutes  Past Psychiatric History:  Previous Psych Diagnoses: MDD, cocaine-induced mood disorder, polysubstance abuse, schizoaffective disorder bipolar type Prior inpatient treatment: She was hospitalized at [facility] from 10/17/21 to 12/07/21; & 01/09/19 23-10 /04/2021. She has also been admitted to [place] last year; Current/prior outpatient treatment: She receives her current outpatient psychiatric care from [facility] in [place].  Psychotherapy hx: unknown History of suicide: previous attempt September 2020 by consuming bleach and vinegar, Yes, 2019 patient cut wrist, 2023 patient drank Clorox and vinegar, 2024 patient drank Clorox. History of homicide: none Psychiatric medication history: Duloxetine 30 mg, Benztropine 0.5 mg, citalopram 20 mg,  Psychiatric medication compliance history: poor compliance Prior inpatient treatment: Yes, see above Current/prior outpatient treatment: Yes Prior rehab hx: Patient denies Psychiatric medication history: Yes Neuromodulation history: Denies Current Psychiatrist: Dr.  Theophilus Bones Current therapist: Patient denies  Past Medical History:  Past Medical History:  Diagnosis Date   Anxiety    Arthritis    Asthma    Bipolar disorder ([place])    Chronic back pain  COPD (chronic obstructive pulmonary disease) (HCC)    Chronic bronchitis   Depression    Dyspnea    GERD (gastroesophageal reflux disease)    Hypertension    Migraine    Neuropathy    Osteoarthritis of left knee, 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   Single subsegmental  pulmonary embolism without acute cor pulmonale ([place]) 06/20/2021   Sleep apnea 04/2020   GETTING A cpap   Suicidal ideation 12/24/2018    Past Surgical History:  Procedure Laterality Date   BACK SURGERY     DILATATION AND CURETTAGE/HYSTEROSCOPY WITH MINERVA N/A 06/09/2020   Procedure: DILATATION AND CURETTAGE/HYSTEROSCOPY WITH MINERVA;  Surgeon: Florian Buff, MD;  Location: [facility] ORS;  Service: Gynecology;  Laterality: N/A;   ESOPHAGOGASTRODUODENOSCOPY (EGD) WITH PROPOFOL N/A 12/25/2018   Procedure: ESOPHAGOGASTRODUODENOSCOPY (EGD) WITH PROPOFOL;  Surgeon: Rogene Houston, MD;  Location: [facility] ENDO SUITE;  Service: Endoscopy;  Laterality: N/A;   FLEXIBLE SIGMOIDOSCOPY  10/14/2021   Procedure: FLEXIBLE SIGMOIDOSCOPY;  Surgeon: Harvel Quale, MD;  Location: [facility] ENDO SUITE;  Service: Gastroenterology;;   PATELLA-FEMORAL ARTHROPLASTY Left 10/08/2018   Procedure: PATELLA-FEMORAL ARTHROPLASTY;  Surgeon: Marchia Bond, MD;  Location: [facility] ORS;  Service: Orthopedics;  Laterality: Left;   TUBAL LIGATION     Family History:  Family History  Problem Relation Age of Onset   Gout Paternal Grandfather    Cirrhosis Paternal Grandfather    Hypertension Paternal Grandmother    Aneurysm Paternal Grandmother    Cirrhosis Maternal Grandmother    Cirrhosis Maternal Grandfather    Cancer Father    Cirrhosis Father    Cirrhosis Mother    Breast cancer Sister    Hypertension Sister    Bronchitis Daughter    Bronchitis Daughter    Asthma Son    Bronchitis Son    Migraines Neg Hx    Social History:  Social History   Substance and Sexual Activity  Alcohol Use Not Currently   Comment: started drinking again last night while at a party as of 06/29/22     Social History   Substance and Sexual Activity  Drug Use Yes   Types: Cocaine    Social History   Socioeconomic History   Marital status: Single    Spouse name: Not on file   Number of children: 3   Years of education: 10   Highest  education level: 10th grade  Occupational History   Not on file  Tobacco Use   Smoking status: Every Day    Packs/day: 0.25    Years: 10.00    Additional pack years: 0.00    Total pack years: 2.50    Types: Cigarettes    Last attempt to quit: 04/2020    Years since quitting: 2.2    Passive exposure: Never   Smokeless tobacco: Never   Tobacco comments:    Smokes often, especially when using crack cocaine  Vaping Use   Vaping Use: Never used  Substance and Sexual Activity   Alcohol use: Not Currently    Comment: started drinking again last night while at a party as of 06/29/22   Drug use: Yes    Types: Cocaine   Sexual activity: Yes    Birth control/protection: Surgical    Comment: tubal, ablation  Other Topics Concern   Not on file  Social History Narrative   R handed    Lives with boyfriend   1 Cup of caffeine daily    Social Determinants of Health   Financial Resource Strain: Not  on file  Food Insecurity: Food Insecurity Present (06/30/2022)   Hunger Vital Sign    Worried About Running Out of Food in the Last Year: Often true    Ran Out of Food in the Last Year: Often true  Transportation Needs: Unmet Transportation Needs (06/30/2022)   PRAPARE - Hydrologist (Medical): Yes    Lack of Transportation (Non-Medical): Yes  Physical Activity: Not on file  Stress: Not on file  Social Connections: Not on file   Additional Social History:    Current Medications: Current Facility-Administered Medications  Medication Dose Route Frequency Provider Last Rate Last Admin   albuterol (PROVENTIL) (2.5 MG/3ML) 0.083% nebulizer solution 2.5 mg  2.5 mg Nebulization Q6H PRN Lucky Rathke, FNP       alum & mag hydroxide-simeth (MAALOX/MYLANTA) 200-200-20 MG/5ML suspension 30 mL  30 mL Oral Q4H PRN Lucky Rathke, FNP   30 mL at 07/02/22 1435   amLODipine (NORVASC) tablet 5 mg  5 mg Oral Daily Lucky Rathke, FNP   5 mg at 07/06/22 0854   cholecalciferol  (VITAMIN D3) 25 MCG (1000 UNIT) tablet 1,000 Units  1,000 Units Oral Daily Lucky Rathke, FNP   1,000 Units at 07/06/22 0854   cloNIDine (CATAPRES) tablet 0.1 mg  0.1 mg Oral Q4H PRN Massengill, Ovid Curd, MD       diphenhydrAMINE (BENADRYL) capsule 50 mg  50 mg Oral TID PRN Lucky Rathke, FNP       Or   diphenhydrAMINE (BENADRYL) injection 50 mg  50 mg Intramuscular TID PRN Lucky Rathke, FNP       DULoxetine (CYMBALTA) DR capsule 60 mg  60 mg Oral Daily Lucky Rathke, FNP   60 mg at 07/06/22 0854   fluticasone furoate-vilanterol (BREO ELLIPTA) 200-25 MCG/ACT 1 puff  1 puff Inhalation Daily Lucky Rathke, FNP   1 puff at 07/05/22 0851   haloperidol (HALDOL) tablet 5 mg  5 mg Oral TID PRN Lucky Rathke, FNP       Or   haloperidol lactate (HALDOL) injection 5 mg  5 mg Intramuscular TID PRN Lucky Rathke, FNP       hydrOXYzine (ATARAX) tablet 100 mg  100 mg Oral Q6H PRN Massengill, Ovid Curd, MD   100 mg at 07/04/22 2141   lidocaine (LIDODERM) 5 % 1 patch  1 patch Transdermal Q24H France Ravens, MD   1 patch at 07/06/22 1131   LORazepam (ATIVAN) tablet 2 mg  2 mg Oral TID PRN Lucky Rathke, FNP       Or   LORazepam (ATIVAN) injection 2 mg  2 mg Intramuscular TID PRN Lucky Rathke, FNP       lurasidone (LATUDA) tablet 60 mg  60 mg Oral Q breakfast Ntuen, Tina C, FNP   60 mg at 07/06/22 0854   magnesium hydroxide (MILK OF MAGNESIA) suspension 30 mL  30 mL Oral Daily PRN Lucky Rathke, FNP   30 mL at 07/02/22 0800   menthol-cetylpyridinium (CEPACOL) lozenge 3 mg  1 lozenge Oral PRN Massengill, Ovid Curd, MD   3 mg at 07/05/22 2120   methocarbamol (ROBAXIN) tablet 500 mg  500 mg Oral Q8H PRN Massengill, Ovid Curd, MD   500 mg at 07/06/22 0854   nicotine (NICODERM CQ - dosed in mg/24 hours) patch 14 mg  14 mg Transdermal Daily Massengill, Ovid Curd, MD   14 mg at 07/05/22 0849   ondansetron (ZOFRAN-ODT) disintegrating tablet 4 mg  4 mg Oral Q8H PRN Lindell Spar I, NP   4 mg at 07/06/22 1131   pantoprazole (PROTONIX) EC  tablet 40 mg  40 mg Oral Daily France Ravens, MD   40 mg at 07/06/22 0854   pregabalin (LYRICA) capsule 150 mg  150 mg Oral BID Lucky Rathke, FNP   150 mg at 07/06/22 0854   ramelteon (ROZEREM) tablet 8 mg  8 mg Oral QHS Massengill, Ovid Curd, MD   8 mg at 07/05/22 2116   Lab Results:  No results found for this or any previous visit (from the past 24 hour(s)).   Blood Alcohol level:  Lab Results  Component Value Date   ETH <10 06/29/2022   ETH <10 [id number]   Metabolic Disorder Labs: Lab Results  Component Value Date   HGBA1C 5.5 01/10/2022   MPG 111.15 01/10/2022   MPG 111.15 10/19/2021   No results found for: "PROLACTIN" Lab Results  Component Value Date   CHOL 168 01/10/2022   TRIG 56 01/10/2022   HDL 57 01/10/2022   CHOLHDL 2.9 01/10/2022   VLDL 11 01/10/2022   LDLCALC 100 (H) 01/10/2022   LDLCALC 84 10/19/2021    Physical Findings:  Musculoskeletal: Strength & Muscle Tone: within normal limits Gait & Station: normal  Psychiatric Specialty Exam:  Presentation  General Appearance:  Casual; Fairly Groomed  Eye Contact: Good  Speech: Clear and Coherent; Normal Rate  Speech Volume: Normal  Handedness: Right  Mood and Affect  Mood: Anxious; Depressed; Hopeless  Affect: Congruent  Thought Process  Thought Processes: Coherent  Descriptions of Associations:Intact  Orientation:Full (Time, Place and Person)  Thought Content:Logical  History of Schizophrenia/Schizoaffective disorder:No  Duration of Psychotic Symptoms:N/A  Hallucinations:Hallucinations: Auditory; Visual Description of Auditory Hallucinations: "I hear voices telling me kill myself". Description of Visual Hallucinations: "I see little green people running around my bed".   Ideas of Reference:None  Suicidal Thoughts:Suicidal Thoughts: Yes, Passive SI Active Intent and/or Plan: With Intent; With Plan   Homicidal Thoughts:Homicidal Thoughts: No HI Active Intent and/or Plan:  Without Intent; Without Plan; Without Means to Carry Out; Without Access to Means   Sensorium  Memory: Immediate Fair; Recent Fair; Remote Fair  Judgment: Fair  Insight: Fair  Executive Functions  Concentration: Good  Attention Span: Good  Recall: Good  Fund of Knowledge: Good  Language: Good  Psychomotor Activity  Psychomotor Activity:Psychomotor Activity: Normal  Assets  Assets: Communication Skills; Desire for Improvement; Financial Resources/Insurance; Resilience  Sleep  Sleep:Sleep: Good Number of Hours of Sleep: 8   Physical Exam: Review of Systems  Constitutional: Negative.   HENT: Negative.    Eyes: Negative.   Respiratory:  Negative for shortness of breath.   Cardiovascular: Negative.  Negative for chest pain.  Gastrointestinal:  Negative for abdominal pain, constipation, diarrhea, heartburn, nausea and vomiting.  Genitourinary: Negative.   Musculoskeletal:  Positive for back pain (s/p back surgery).  Neurological:  Negative for headaches.  Endo/Heme/Allergies: Negative.   Psychiatric/Behavioral:  Positive for depression, hallucinations, substance abuse and suicidal ideas. The patient is nervous/anxious and has insomnia.    Blood pressure 121/70, pulse 89, temperature 97.7 F (36.5 C), resp. rate 16, height 4\' 11"  (1.499 m), weight 86.6 kg, last menstrual period 06/07/2020, SpO2 100 %. Body mass index is 38.58 kg/m.  Patient's mood symptoms predominantly correlated with her cocaine use and housing.  She does not exhibit any psychotic nor depressive symptoms outside of it.  LCSW to provide substance use housing resources to aid with  patient's homelessness which seems to be a primary source of her distress and mood symptoms.   Daily contact with patient to assess and evaluate symptoms and progress in treatment and Medication management.    Continue inpatient hospitalization.  Will continue today 07/06/2022 plan as below except where it is noted.    PLAN Safety and Monitoring: Voluntary admission to inpatient psychiatric unit for safety, stabilization and treatment Daily contact with patient to assess and evaluate symptoms and progress in treatment Patient's case to be discussed in multi-disciplinary team meeting Observation Level : q15 minute checks Vital signs: q12 hours Precautions: suicide, elopement, and assault  2. Psychiatric Problems MDD-Severe, recurrent episode without psychosis Stimulant use disorder-cocaine Nicotine use disorder Housing Instability.   --Continue Cymbalta DR Capsule 60 mg p.o. daily for depression --Continue Latuda tablets 60 mg daily with breakfast for bipolar d/o. --Continue hydroxyzine 100 mg p.o. every 6 hours as needed for anxiety --Nicotine patch for NRT.  --Continue Rozerem 8 mg po Q hs for insomnia.    Agitation protocols: Cont as recommended;  -Benadryl 50 mg po or IM tid prn. -Haldol 5 mg po or IM tid prn.  -Lorazepam 2 mg po or IM tid prn.  Medical Problems Hypertension Likely also secondary to cocaine use 121/70 today -Amlodipine 5 mg daily  Asthma -Recent exacerbation and placed on 5 day course of prednisone on 06/27/22 -Fluticasone furoate-vilanterol 200-25 mcg/ACT 1 puff daily  -Completed 5 day course of prednisone today (07/02/22).   Back Pain -Lyrica 150 mg twice daily -Lidocaine patch trans-dermally for pain.  -Continue Robaxin 500 mg po tid prn for muscle spasms.   GERD.  Continue Protonix 40 mg po Q am for GERD.  PRNs -Tylenol 650 mg for mild pain -Maalox/Mylanta 30 mL for indigestion -Hydroxyzine 25 mg tid for anxiety -Milk of Magnesia 30 mL for constipation.  -Continue clonidine tablet 0.1 mg po Q 4 hours as needed if systolic blood pressure over 99991111 or if diastolic blood pressure over 100    4. Discharge Planning: Social work and case management to assist with discharge planning and identification of hospital follow-up needs prior to discharge Estimated  LOS: 3 days Discharge Concerns: Need to establish a safety plan; Medication compliance and effectiveness Discharge Goals: Return home with outpatient referrals for mental health follow-up including medication management/psychotherapy  Lindell Spar, NP, pmhnp, fnp-bc. 07/06/2022, 4:16 PMPatient ID: Kathryn [name], female   DOB: Sep 13, 1970, 52 y.o.   MRN: [id number] Patient ID: Kathryn [name], female   DOB: Aug 30, 1970, 52 y.o.   MRN: [id number] Patient ID: Kathryn [name], female   DOB: Jan 14, 1971, 52 y.o.   MRN: [id number]

## 2022-07-07 ENCOUNTER — Encounter (HOSPITAL_COMMUNITY): Payer: Self-pay

## 2022-07-07 NOTE — Progress Notes (Addendum)
D: Pt denied SI/HI/AVH this morning. Pt rated her depression a 10/10, anxiety a 10/10, and feelings of hopelessness a 10/10. Pt has remained isolated in her room for entire day due to positive Rhinovirus status. Pt complains of chronic mid-lower back pain throughout the day. Pt has been cooperative throughout the shift.   A: RN provided support and encouragement to patient. Pt given scheduled medications as prescribed. PRN Robaxin given for back pain/ spasms. Q15 min checks verified for safety.    R: Patient verbally contracts for safety. Patient compliant with medications and treatment plan.Pt is safe on the unit.   07/07/22 0800  Psych Admission Type (Psych Patients Only)  Admission Status Voluntary  Psychosocial Assessment  Patient Complaints Anxiety;Depression (Pt rates her anxiety and depression a 10/10)  Eye Contact Fair  Facial Expression Animated  Affect Anxious;Depressed  Speech Logical/coherent  Interaction Assertive  Motor Activity Slow  Appearance/Hygiene Unremarkable  Behavior Characteristics Cooperative;Appropriate to situation  Mood Anxious;Pleasant  Thought Process  Coherency WDL  Content WDL  Delusions None reported or observed  Perception WDL  Hallucination None reported or observed  Judgment Poor  Confusion None  Danger to Self  Current suicidal ideation? Denies  Self-Injurious Behavior No self-injurious ideation or behavior indicators observed or expressed   Agreement Not to Harm Self Yes  Description of Agreement Pt verbally contracts for safety  Danger to Others  Danger to Others None reported or observed

## 2022-07-07 NOTE — [facility] Group Notes (Signed)
[facility] Group Notes:  (Nursing/MHT/Case Management/Adjunct)  Date:  07/07/2022  Time:  8:20 PM  Type of Therapy:   AA  Participation Level:  Did Not Attend  Participation Quality:   na  Affect:   na  Cognitive:   na  Insight:  None  Engagement in Group:   na  Modes of Intervention:   na  Summary of Progress/Problems: Didn't attend AA group.  Orvan Falconer 07/07/2022, 8:20 PM

## 2022-07-07 NOTE — Progress Notes (Signed)
Adult Psychoeducational Group Note  Date:  07/07/2022 Time:  4:57 PM  Group Topic/Focus:  Goals Group:   The focus of this group is to help patients establish daily goals to achieve during treatment and discuss how the patient can incorporate goal setting into their daily lives to aide in recovery. Orientation:   The focus of this group is to educate the patient on the purpose and policies of crisis stabilization and provide a format to answer questions about their admission.  The group details unit policies and expectations of patients while admitted.  Participation Level:  Did Not Attend  Participation Quality:    Affect:    Cognitive:    Insight:   Engagement in Group:    Modes of Intervention:    Additional Comments:  Pt did not attend goals/orientation.  [name] 07/07/2022, 4:57 PM

## 2022-07-07 NOTE — Progress Notes (Signed)
   07/06/22 2000  Psych Admission Type (Psych Patients Only)  Admission Status Voluntary  Psychosocial Assessment  Patient Complaints Malaise  Eye Contact Fair  Facial Expression Animated  Affect Depressed  Speech Logical/coherent  Interaction Assertive  Motor Activity Slow  Appearance/Hygiene Unremarkable  Behavior Characteristics Cooperative;Appropriate to situation  Mood Pleasant  Thought Process  Coherency WDL  Content WDL  Delusions None reported or observed  Perception WDL  Hallucination None reported or observed  Judgment Poor  Confusion None  Danger to Self  Current suicidal ideation? Denies  Self-Injurious Behavior No self-injurious ideation or behavior indicators observed or expressed   Agreement Not to Harm Self Yes  Description of Agreement verbal  Danger to Others  Danger to Others None reported or observed

## 2022-07-07 NOTE — BH IP Treatment Plan (Signed)
Interdisciplinary Treatment and Diagnostic Plan Update  07/07/2022 Time of Session: 1044 Kathryn [name] MRN: [id number]  Principal Diagnosis: Schizoaffective disorder, bipolar type ([place])  Secondary Diagnoses: Principal Problem:   Schizoaffective disorder, bipolar type ([place]) Active Problems:   GAD (generalized anxiety disorder)   Cocaine use disorder ([place])   Homelessness   Current Medications:  Current Facility-Administered Medications  Medication Dose Route Frequency Provider Last Rate Last Admin   albuterol (PROVENTIL) (2.5 MG/3ML) 0.083% nebulizer solution 2.5 mg  2.5 mg Nebulization Q6H PRN Lucky Rathke, FNP       alum & mag hydroxide-simeth (MAALOX/MYLANTA) 200-200-20 MG/5ML suspension 30 mL  30 mL Oral Q4H PRN Lucky Rathke, FNP   30 mL at 07/02/22 1435   amLODipine (NORVASC) tablet 5 mg  5 mg Oral Daily Lucky Rathke, FNP   5 mg at 07/07/22 0756   cholecalciferol (VITAMIN D3) 25 MCG (1000 UNIT) tablet 1,000 Units  1,000 Units Oral Daily Lucky Rathke, FNP   1,000 Units at 07/07/22 0755   cloNIDine (CATAPRES) tablet 0.1 mg  0.1 mg Oral Q4H PRN Massengill, Ovid Curd, MD       diphenhydrAMINE (BENADRYL) capsule 50 mg  50 mg Oral TID PRN Lucky Rathke, FNP       Or   diphenhydrAMINE (BENADRYL) injection 50 mg  50 mg Intramuscular TID PRN Lucky Rathke, FNP       DULoxetine (CYMBALTA) DR capsule 60 mg  60 mg Oral Daily Lucky Rathke, FNP   60 mg at 07/07/22 0755   fluticasone furoate-vilanterol (BREO ELLIPTA) 200-25 MCG/ACT 1 puff  1 puff Inhalation Daily Lucky Rathke, FNP   1 puff at 07/07/22 0756   haloperidol (HALDOL) tablet 5 mg  5 mg Oral TID PRN Lucky Rathke, FNP       Or   haloperidol lactate (HALDOL) injection 5 mg  5 mg Intramuscular TID PRN Lucky Rathke, FNP       hydrOXYzine (ATARAX) tablet 100 mg  100 mg Oral Q6H PRN Massengill, Ovid Curd, MD   100 mg at 07/06/22 2037   lidocaine (LIDODERM) 5 % 1 patch  1 patch Transdermal Q24H France Ravens, MD   1 patch at 07/06/22 1131    LORazepam (ATIVAN) tablet 2 mg  2 mg Oral TID PRN Lucky Rathke, FNP       Or   LORazepam (ATIVAN) injection 2 mg  2 mg Intramuscular TID PRN Lucky Rathke, FNP       lurasidone (LATUDA) tablet 60 mg  60 mg Oral Q breakfast Ntuen, Tina C, FNP   60 mg at 07/07/22 0756   magnesium hydroxide (MILK OF MAGNESIA) suspension 30 mL  30 mL Oral Daily PRN Lucky Rathke, FNP   30 mL at 07/02/22 0800   menthol-cetylpyridinium (CEPACOL) lozenge 3 mg  1 lozenge Oral PRN Massengill, Ovid Curd, MD   3 mg at 07/07/22 0802   methocarbamol (ROBAXIN) tablet 500 mg  500 mg Oral Q8H PRN Massengill, Ovid Curd, MD   500 mg at 07/07/22 0759   nicotine (NICODERM CQ - dosed in mg/24 hours) patch 14 mg  14 mg Transdermal Daily Massengill, Ovid Curd, MD   14 mg at 07/05/22 0849   ondansetron (ZOFRAN-ODT) disintegrating tablet 4 mg  4 mg Oral Q8H PRN Lindell Spar I, NP   4 mg at 07/06/22 1131   pantoprazole (PROTONIX) EC tablet 40 mg  40 mg Oral Daily France Ravens, MD   40 mg at  07/07/22 0755   pregabalin (LYRICA) capsule 150 mg  150 mg Oral BID Lucky Rathke, FNP   150 mg at 07/07/22 0757   ramelteon (ROZEREM) tablet 8 mg  8 mg Oral QHS Massengill, Ovid Curd, MD   8 mg at 07/06/22 2036   PTA Medications: Medications Prior to Admission  Medication Sig Dispense Refill Last Dose   albuterol (PROVENTIL) (2.5 MG/3ML) 0.083% nebulizer solution Take 3 mLs (2.5 mg total) by nebulization every 6 (six) hours as needed for wheezing or shortness of breath. 75 mL 12 Past Week   albuterol (VENTOLIN HFA) 108 (90 Base) MCG/ACT inhaler Inhale 2 puffs into the lungs every 4 (four) hours as needed for wheezing or shortness of breath. 1 each 1 Past Week   amLODipine (NORVASC) 5 MG tablet Take 1 tablet (5 mg total) by mouth daily. 90 tablet 1 unk   amoxicillin-clavulanate (AUGMENTIN) 875-125 MG tablet Take 1 tablet by mouth 2 (two) times daily.   unk   anagrelide (AGRYLIN) 1 MG capsule Take 1 capsule (1 mg total) by mouth 2 (two) times daily. 60 capsule 0 unk    benzonatate (TESSALON) 200 MG capsule Take by mouth.   unk   BREO ELLIPTA 200-25 MCG/ACT AEPB INHALE 1 PUFF EVERY DAY 180 each 10 unk   cholecalciferol (VITAMIN D3) 25 MCG (1000 UNIT) tablet Take 1,000 Units by mouth daily.   unk   citalopram (CELEXA) 20 MG tablet Take 20 mg by mouth daily.   unk   DULoxetine (CYMBALTA) 60 MG capsule Take 1 capsule (60 mg total) by mouth daily. 30 capsule 0 unk   furosemide (LASIX) 20 MG tablet Take 1 tablet (20 mg total) by mouth daily. 30 tablet 10 unk   hydrOXYzine (VISTARIL) 25 MG capsule Take 4 capsules (100 mg total) by mouth every 6 (six) hours as needed. 15 capsule 0 unk   lubiprostone (AMITIZA) 24 MCG capsule Take 1 capsule (24 mcg total) by mouth 2 (two) times daily with a meal. 60 capsule 0 unk   lurasidone (LATUDA) 40 MG TABS tablet Take 1 tablet (40 mg total) by mouth daily with breakfast. 30 tablet 0 unk   mometasone-formoterol (DULERA) 100-5 MCG/ACT AERO Inhale 2 puffs into the lungs 2 (two) times daily. 1 each 0 unk   omeprazole (PRILOSEC) 20 MG capsule Take 20 mg by mouth daily.   unk   pantoprazole (PROTONIX) 40 MG tablet Take 1 tablet (40 mg total) by mouth daily. 30 tablet 0 unk   potassium chloride SA (KLOR-CON M) 20 MEQ tablet Take 1 tablet (20 mEq total) by mouth daily. TAKE (1) TABLET BY MOUTH ONCE A DAY. Strength: 20 mEq 30 tablet 0 unk   predniSONE (DELTASONE) 50 MG tablet Take 1 tablet (50 mg total) by mouth daily. 5 tablet 0 unk   pregabalin (LYRICA) 150 MG capsule Take 1 capsule (150 mg total) by mouth 2 (two) times daily. 60 capsule 0 unk   risperiDONE (RISPERDAL) 0.5 MG tablet Take 0.5 mg by mouth 2 (two) times daily.   unk   Vitamin D, Ergocalciferol, (DRISDOL) 1.25 MG (50000 UNIT) CAPS capsule TAKE 1 CAPSULE THE SAME DAY EACH WEEK. 12 capsule 1 unk    Patient Stressors: Soil scientist issue   Medication change or noncompliance   Substance abuse    Patient Strengths: Supportive family/friends   Treatment  Modalities: Medication Management, Group therapy, Case management,  1 to 1 session with clinician, Psychoeducation, Recreational therapy.   Physician Treatment  Plan for Primary Diagnosis: Schizoaffective disorder, bipolar type ([place]) Long Term Goal(s): Improvement in symptoms so as ready for discharge   Short Term Goals: Ability to identify changes in lifestyle to reduce recurrence of condition will improve Ability to verbalize feelings will improve Ability to disclose and discuss suicidal ideas Ability to demonstrate self-control will improve Ability to identify and develop effective coping behaviors will improve Ability to maintain clinical measurements within normal limits will improve Compliance with prescribed medications will improve Ability to identify triggers associated with substance abuse/mental health issues will improve  Medication Management: Evaluate patient's response, side effects, and tolerance of medication regimen.  Therapeutic Interventions: 1 to 1 sessions, Unit Group sessions and Medication administration.  Evaluation of Outcomes: Progressing  Physician Treatment Plan for Secondary Diagnosis: Principal Problem:   Schizoaffective disorder, bipolar type ([place]) Active Problems:   GAD (generalized anxiety disorder)   Cocaine use disorder ([place])   Homelessness  Long Term Goal(s): Improvement in symptoms so as ready for discharge   Short Term Goals: Ability to identify changes in lifestyle to reduce recurrence of condition will improve Ability to verbalize feelings will improve Ability to disclose and discuss suicidal ideas Ability to demonstrate self-control will improve Ability to identify and develop effective coping behaviors will improve Ability to maintain clinical measurements within normal limits will improve Compliance with prescribed medications will improve Ability to identify triggers associated with substance abuse/mental health issues will improve      Medication Management: Evaluate patient's response, side effects, and tolerance of medication regimen.  Therapeutic Interventions: 1 to 1 sessions, Unit Group sessions and Medication administration.  Evaluation of Outcomes: Progressing   RN Treatment Plan for Primary Diagnosis: Schizoaffective disorder, bipolar type ([place]) Long Term Goal(s): Knowledge of disease and therapeutic regimen to maintain health will improve  Short Term Goals: Ability to remain free from injury will improve, Ability to verbalize frustration and anger appropriately will improve, Ability to demonstrate self-control, Ability to participate in decision making will improve, Ability to verbalize feelings will improve, Ability to disclose and discuss suicidal ideas, Ability to identify and develop effective coping behaviors will improve, and Compliance with prescribed medications will improve  Medication Management: RN will administer medications as ordered by provider, will assess and evaluate patient's response and provide education to patient for prescribed medication. RN will report any adverse and/or side effects to prescribing provider.  Therapeutic Interventions: 1 on 1 counseling sessions, Psychoeducation, Medication administration, Evaluate responses to treatment, Monitor vital signs and CBGs as ordered, Perform/monitor CIWA, COWS, AIMS and Fall Risk screenings as ordered, Perform wound care treatments as ordered.  Evaluation of Outcomes: Progressing   LCSW Treatment Plan for Primary Diagnosis: Schizoaffective disorder, bipolar type ([place]) Long Term Goal(s): Safe transition to appropriate next level of care at discharge, Engage patient in therapeutic group addressing interpersonal concerns.  Short Term Goals: Engage patient in aftercare planning with referrals and resources, Increase social support, Increase ability to appropriately verbalize feelings, Increase emotional regulation, Facilitate acceptance of mental  health diagnosis and concerns, Facilitate patient progression through stages of change regarding substance use diagnoses and concerns, Identify triggers associated with mental health/substance abuse issues, and Increase skills for wellness and recovery  Therapeutic Interventions: Assess for all discharge needs, 1 to 1 time with Social worker, Explore available resources and support systems, Assess for adequacy in community support network, Educate family and significant other(s) on suicide prevention, Complete Psychosocial Assessment, Interpersonal group therapy.  Evaluation of Outcomes: Progressing   Progress in Treatment: Attending groups:  No. Participating in groups: No. Taking medication as prescribed: Yes. Toleration medication: Yes. Family/Significant other contact made: Yes, individual(s) contacted:  Philomena Doheny [facility] Patient understands diagnosis: Yes. Discussing patient identified problems/goals with staff: Yes. Medical problems stabilized or resolved: Yes. Denies suicidal/homicidal ideation: No. Issues/concerns per patient self-inventory: Yes. Other:   New problem(s) identified: No, Describe:  None Reported  New Short Term/Long Term Goal(s): Patient to work towards detox, elimination of symptoms of psychosis, medication management for mood stabilization; elimination of SI thoughts; development of comprehensive mental wellness/sobriety plan.  Patient Goals:  Sobriety/Medication Management  Discharge Plan or Barriers: Homelessness and Sobriety  Reason for Continuation of Hospitalization: Anxiety Depression Medication stabilization Suicidal ideation Withdrawal symptoms  Estimated Length of Stay: 3-7 Days  Last [place] Suicide Severity Risk Score: [place] Admission (Current) from 06/30/2022 in [place] [facility] ED from 06/29/2022 in [facility] Emergency Department at [facility] ED from 06/27/2022 in [facility]  Emergency Department at [place] High Risk No Risk       Last [facility] 2/9 Scores:    02/02/2022    9:20 AM 12/20/2021   11:26 AM 11/03/2021    9:10 AM  Depression screen PHQ 2/9  Decreased Interest 0 3 0  Down, Depressed, Hopeless 0 3 0  PHQ - 2 Score 0 6 0  Altered sleeping 0 3   Tired, decreased energy 0 3   Change in appetite 0 3   Feeling bad or failure about yourself  0 3   Trouble concentrating 0 3   Moving slowly or fidgety/restless 0 0   Suicidal thoughts 0 3   PHQ-9 Score 0 24   Difficult doing work/chores Not difficult at all Very difficult   detox, medication management for mood stabilization; elimination of SI thoughts; development of comprehensive mental wellness/sobriety plan    Scribe for Treatment Team: Windle Guard, LCSW 07/07/2022 10:44 AM

## 2022-07-07 NOTE — Progress Notes (Addendum)
[facility] MD Progress Note  07/07/2022 3:59 PM ANNETE [name]  MRN:  [id number]  Principal Problem: Schizoaffective disorder, bipolar type ([place])  Diagnosis: Principal Problem:   Schizoaffective disorder, bipolar type ([place]) Active Problems:   GAD (generalized anxiety disorder)   Cocaine use disorder ([place])   Homelessness  Reason for Admission: Kathryn [name] is a 52 y.o. female w/ hx of cocaine induced mood disorder, MDD who presented to [facility] voluntarily from [place] due to [place] in setting of cocaine abuse.  This is hospitalization day 7.  24-hour chart review: Past 24 hours of patient's chart was reviewed.  Patient is compliant with scheduled meds. Required Agitation PRNs: none Per RN notes, no documented behavioral issues and is not attending group because she is currently in her room due rhino virus infection. Patient slept, 8 hours  Daily notes: [name] is seen in her room. Chart reviewed. The chart findings discussed with the treatment team. She presents alert, oriented & aware of situation. She is not visible on the unit, currently in her room due to rhino virus infection. She is wearing a mask. Continues to use walker to aid her ambulation & balance due to lower extremity pain/weakness. Kynzlei reports, "I have been feeling good today. I'm not depressed today or anxious. I have not been hearing voices since last night. I have not seen any little green people since yesterday as well. I will be calling my daughter to talk about her picking me up after discharge". [name] denies any SIHI, AVH, delusional thoughts or paranoia. She denies any coughs or congestions. She denies any SOB. She remains in her room due to the Rhino virus infection. Possible discharge by this weekend or Monday. There no changes made on her current plan of care. Vital signs, stable.  Total Time spent with patient:  25 minutes  Past Psychiatric History:  Previous Psych Diagnoses: MDD, cocaine-induced mood disorder,  polysubstance abuse, schizoaffective disorder bipolar type Prior inpatient treatment: She was hospitalized at [facility] from 10/17/21 to 12/07/21; & 01/09/19 23-10 /04/2021. She has also been admitted to [facility] last year; Current/prior outpatient treatment: She receives her current outpatient psychiatric care from [facility] in [place].  Psychotherapy hx: unknown History of suicide: previous attempt September 2020 by consuming bleach and vinegar, Yes, 2019 patient cut wrist, 2023 patient drank Clorox and vinegar, 2024 patient drank Clorox. History of homicide: none Psychiatric medication history: Duloxetine 30 mg, Benztropine 0.5 mg, citalopram 20 mg,  Psychiatric medication compliance history: poor compliance Prior inpatient treatment: Yes, see above Current/prior outpatient treatment: Yes Prior rehab hx: Patient denies Psychiatric medication history: Yes Neuromodulation history: Denies Current Psychiatrist: Dr.  Theophilus Bones Current therapist: Patient denies  Past Medical History:  Past Medical History:  Diagnosis Date   Anxiety    Arthritis    Asthma    Bipolar disorder ([place])    Chronic back pain    COPD (chronic obstructive pulmonary disease) (HCC)    Chronic bronchitis   Depression    Dyspnea    GERD (gastroesophageal reflux disease)    Hypertension    Migraine    Neuropathy    Osteoarthritis of left knee, 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   Single subsegmental pulmonary embolism without acute cor pulmonale ([place]) 06/20/2021   Sleep apnea 04/2020   GETTING A cpap   Suicidal ideation 12/24/2018    Past Surgical History:  Procedure Laterality Date   BACK SURGERY     DILATATION AND CURETTAGE/HYSTEROSCOPY WITH MINERVA N/A 06/09/2020   Procedure: DILATATION AND CURETTAGE/HYSTEROSCOPY  WITH MINERVA;  Surgeon: Florian Buff, MD;  Location: [facility] ORS;  Service: Gynecology;  Laterality: N/A;   ESOPHAGOGASTRODUODENOSCOPY (EGD) WITH PROPOFOL N/A 12/25/2018   Procedure: ESOPHAGOGASTRODUODENOSCOPY  (EGD) WITH PROPOFOL;  Surgeon: Rogene Houston, MD;  Location: [facility] ENDO SUITE;  Service: Endoscopy;  Laterality: N/A;   FLEXIBLE SIGMOIDOSCOPY  10/14/2021   Procedure: FLEXIBLE SIGMOIDOSCOPY;  Surgeon: Harvel Quale, MD;  Location: [facility] ENDO SUITE;  Service: Gastroenterology;;   PATELLA-FEMORAL ARTHROPLASTY Left 10/08/2018   Procedure: PATELLA-FEMORAL ARTHROPLASTY;  Surgeon: Marchia Bond, MD;  Location: [facility] ORS;  Service: Orthopedics;  Laterality: Left;   TUBAL LIGATION     Family History:  Family History  Problem Relation Age of Onset   Gout Paternal Grandfather    Cirrhosis Paternal Grandfather    Hypertension Paternal Grandmother    Aneurysm Paternal Grandmother    Cirrhosis Maternal Grandmother    Cirrhosis Maternal Grandfather    Cancer Father    Cirrhosis Father    Cirrhosis Mother    Breast cancer Sister    Hypertension Sister    Bronchitis Daughter    Bronchitis Daughter    Asthma Son    Bronchitis Son    Migraines Neg Hx    Social History:  Social History   Substance and Sexual Activity  Alcohol Use Not Currently   Comment: started drinking again last night while at a party as of 06/29/22     Social History   Substance and Sexual Activity  Drug Use Yes   Types: Cocaine    Social History   Socioeconomic History   Marital status: Single    Spouse name: Not on file   Number of children: 3   Years of education: 10   Highest education level: 10th grade  Occupational History   Not on file  Tobacco Use   Smoking status: Every Day    Packs/day: 0.25    Years: 10.00    Additional pack years: 0.00    Total pack years: 2.50    Types: Cigarettes    Last attempt to quit: 04/2020    Years since quitting: 2.2    Passive exposure: Never   Smokeless tobacco: Never   Tobacco comments:    Smokes often, especially when using crack cocaine  Vaping Use   Vaping Use: Never used  Substance and Sexual Activity   Alcohol use: Not Currently    Comment:  started drinking again last night while at a party as of 06/29/22   Drug use: Yes    Types: Cocaine   Sexual activity: Yes    Birth control/protection: Surgical    Comment: tubal, ablation  Other Topics Concern   Not on file  Social History Narrative   R handed    Lives with boyfriend   1 Cup of caffeine daily    Social Determinants of Health   Financial Resource Strain: Not on file  Food Insecurity: Food Insecurity Present (06/30/2022)   Hunger Vital Sign    Worried About Running Out of Food in the Last Year: Often true    Ran Out of Food in the Last Year: Often true  Transportation Needs: Unmet Transportation Needs (06/30/2022)   PRAPARE - Hydrologist (Medical): Yes    Lack of Transportation (Non-Medical): Yes  Physical Activity: Not on file  Stress: Not on file  Social Connections: Not on file   Additional Social History:    Current Medications: Current Facility-Administered Medications  Medication Dose Route  Frequency Provider Last Rate Last Admin   albuterol (PROVENTIL) (2.5 MG/3ML) 0.083% nebulizer solution 2.5 mg  2.5 mg Nebulization Q6H PRN Lucky Rathke, FNP       alum & mag hydroxide-simeth (MAALOX/MYLANTA) 200-200-20 MG/5ML suspension 30 mL  30 mL Oral Q4H PRN Lucky Rathke, FNP   30 mL at 07/02/22 1435   amLODipine (NORVASC) tablet 5 mg  5 mg Oral Daily Lucky Rathke, FNP   5 mg at 07/07/22 0756   cholecalciferol (VITAMIN D3) 25 MCG (1000 UNIT) tablet 1,000 Units  1,000 Units Oral Daily Lucky Rathke, FNP   1,000 Units at 07/07/22 0755   cloNIDine (CATAPRES) tablet 0.1 mg  0.1 mg Oral Q4H PRN Massengill, Ovid Curd, MD       diphenhydrAMINE (BENADRYL) capsule 50 mg  50 mg Oral TID PRN Lucky Rathke, FNP       Or   diphenhydrAMINE (BENADRYL) injection 50 mg  50 mg Intramuscular TID PRN Lucky Rathke, FNP       DULoxetine (CYMBALTA) DR capsule 60 mg  60 mg Oral Daily Lucky Rathke, FNP   60 mg at 07/07/22 0755   fluticasone furoate-vilanterol  (BREO ELLIPTA) 200-25 MCG/ACT 1 puff  1 puff Inhalation Daily Lucky Rathke, FNP   1 puff at 07/07/22 0756   haloperidol (HALDOL) tablet 5 mg  5 mg Oral TID PRN Lucky Rathke, FNP       Or   haloperidol lactate (HALDOL) injection 5 mg  5 mg Intramuscular TID PRN Lucky Rathke, FNP       hydrOXYzine (ATARAX) tablet 100 mg  100 mg Oral Q6H PRN Massengill, Ovid Curd, MD   100 mg at 07/06/22 2037   lidocaine (LIDODERM) 5 % 1 patch  1 patch Transdermal Q24H France Ravens, MD   1 patch at 07/06/22 1131   LORazepam (ATIVAN) tablet 2 mg  2 mg Oral TID PRN Lucky Rathke, FNP       Or   LORazepam (ATIVAN) injection 2 mg  2 mg Intramuscular TID PRN Lucky Rathke, FNP       lurasidone (LATUDA) tablet 60 mg  60 mg Oral Q breakfast Ntuen, Tina C, FNP   60 mg at 07/07/22 0756   magnesium hydroxide (MILK OF MAGNESIA) suspension 30 mL  30 mL Oral Daily PRN Lucky Rathke, FNP   30 mL at 07/02/22 0800   menthol-cetylpyridinium (CEPACOL) lozenge 3 mg  1 lozenge Oral PRN Massengill, Ovid Curd, MD   3 mg at 07/07/22 0802   methocarbamol (ROBAXIN) tablet 500 mg  500 mg Oral Q8H PRN Massengill, Ovid Curd, MD   500 mg at 07/07/22 0759   nicotine (NICODERM CQ - dosed in mg/24 hours) patch 14 mg  14 mg Transdermal Daily Massengill, Ovid Curd, MD   14 mg at 07/05/22 0849   ondansetron (ZOFRAN-ODT) disintegrating tablet 4 mg  4 mg Oral Q8H PRN Lindell Spar I, NP   4 mg at 07/06/22 1131   pantoprazole (PROTONIX) EC tablet 40 mg  40 mg Oral Daily France Ravens, MD   40 mg at 07/07/22 0755   pregabalin (LYRICA) capsule 150 mg  150 mg Oral BID Lucky Rathke, FNP   150 mg at 07/07/22 0757   ramelteon (ROZEREM) tablet 8 mg  8 mg Oral QHS Massengill, Ovid Curd, MD   8 mg at 07/06/22 2036   Lab Results:  No results found for this or any previous visit (from the past 24 hour(s)).  Blood Alcohol level:  Lab Results  Component Value Date   ETH <10 06/29/2022   ETH <10 [id number]   Metabolic Disorder Labs: Lab Results  Component Value Date    HGBA1C 5.5 01/10/2022   MPG 111.15 01/10/2022   MPG 111.15 10/19/2021   No results found for: "PROLACTIN" Lab Results  Component Value Date   CHOL 168 01/10/2022   TRIG 56 01/10/2022   HDL 57 01/10/2022   CHOLHDL 2.9 01/10/2022   VLDL 11 01/10/2022   LDLCALC 100 (H) 01/10/2022   LDLCALC 84 10/19/2021   Physical Findings:  Musculoskeletal: Strength & Muscle Tone: within normal limits Gait & Station: normal  Psychiatric Specialty Exam:  Presentation  General Appearance:  Casual; Fairly Groomed  Eye Contact: Good  Speech: Clear and Coherent; Normal Rate  Speech Volume: Normal  Handedness: Right  Mood and Affect  Mood: Anxious; Depressed; Hopeless  Affect: Congruent  Thought Process  Thought Processes: Coherent  Descriptions of Associations:Intact  Orientation:Full (Time, Place and Person)  Thought Content:Logical  History of Schizophrenia/Schizoaffective disorder:No  Duration of Psychotic Symptoms:N/A  Hallucinations:Hallucinations: Auditory; Visual Description of Auditory Hallucinations: "I hear voices telling me kill myself". Description of Visual Hallucinations: "I see little green people running around my bed".   Ideas of Reference:None  Suicidal Thoughts:Suicidal Thoughts: Yes, Passive SI Active Intent and/or Plan: With Intent; With Plan   Homicidal Thoughts:Homicidal Thoughts: No HI Active Intent and/or Plan: Without Intent; Without Plan; Without Means to Carry Out; Without Access to Means   Sensorium  Memory: Immediate Fair; Recent Fair; Remote Fair  Judgment: Fair  Insight: Fair  Executive Functions  Concentration: Good  Attention Span: Good  Recall: Good  Fund of Knowledge: Good  Language: Good  Psychomotor Activity  Psychomotor Activity:Psychomotor Activity: Normal  Assets  Assets: Communication Skills; Desire for Improvement; Financial Resources/Insurance; Resilience  Sleep  Sleep:Sleep: Good Number  of Hours of Sleep: 8  Physical Exam: Review of Systems  Constitutional: Negative.   HENT: Negative.    Eyes: Negative.   Respiratory:  Negative for shortness of breath.   Cardiovascular: Negative.  Negative for chest pain.  Gastrointestinal:  Negative for abdominal pain, constipation, diarrhea, heartburn, nausea and vomiting.  Genitourinary: Negative.   Musculoskeletal:  Positive for back pain (s/p back surgery).  Neurological:  Negative for headaches.  Endo/Heme/Allergies: Negative.   Psychiatric/Behavioral:  Positive for depression, hallucinations, substance abuse and suicidal ideas. The patient is nervous/anxious and has insomnia.    Blood pressure 117/70, pulse 90, temperature 97.7 F (36.5 C), resp. rate 16, height 4\' 11"  (1.499 m), weight 86.6 kg, last menstrual period 06/07/2020, SpO2 98 %. Body mass index is 38.58 kg/m.  Patient's mood symptoms predominantly correlated with her cocaine use and housing.  She does not exhibit any psychotic nor depressive symptoms outside of it.  LCSW to provide substance use housing resources to aid with patient's homelessness which seems to be a primary source of her distress and mood symptoms.   Daily contact with patient to assess and evaluate symptoms and progress in treatment and Medication management.    Continue inpatient hospitalization.  Will continue today 07/07/2022 plan as below except where it is noted.   PLAN Safety and Monitoring: Voluntary admission to inpatient psychiatric unit for safety, stabilization and treatment Daily contact with patient to assess and evaluate symptoms and progress in treatment Patient's case to be discussed in multi-disciplinary team meeting Observation Level : q15 minute checks Vital signs: q12 hours Precautions: suicide, elopement, and assault  2. Psychiatric Problems MDD-Severe, recurrent episode without psychosis Stimulant use disorder-cocaine Nicotine use disorder Housing Instability.    --Continue Cymbalta DR Capsule 60 mg p.o. daily for depression --Continue Latuda tablets 60 mg daily with breakfast for bipolar d/o. --Continue hydroxyzine 100 mg p.o. every 6 hours as needed for anxiety --Nicotine patch for NRT.  --Continue Rozerem 8 mg po Q hs for insomnia.    Agitation protocols: Cont as recommended;  -Benadryl 50 mg po or IM tid prn. -Haldol 5 mg po or IM tid prn.  -Lorazepam 2 mg po or IM tid prn.  Medical Problems Hypertension Likely also secondary to cocaine use 121/70 today -Amlodipine 5 mg daily  Asthma -Recent exacerbation and placed on 5 day course of prednisone on 06/27/22 -Fluticasone furoate-vilanterol 200-25 mcg/ACT 1 puff daily  -Completed 5 day course of prednisone today (07/02/22).   Back Pain -Lyrica 150 mg twice daily -Lidocaine patch trans-dermally for pain.  -Continue Robaxin 500 mg po tid prn for muscle spasms.   GERD.  Continue Protonix 40 mg po Q am for GERD.  PRNs -Tylenol 650 mg for mild pain -Maalox/Mylanta 30 mL for indigestion -Hydroxyzine 25 mg tid for anxiety -Milk of Magnesia 30 mL for constipation.  -Continue clonidine tablet 0.1 mg po Q 4 hours as needed if systolic blood pressure over 99991111 or if diastolic blood pressure over 100    4. Discharge Planning: Social work and case management to assist with discharge planning and identification of hospital follow-up needs prior to discharge Estimated LOS: 3 days Discharge Concerns: Need to establish a safety plan; Medication compliance and effectiveness Discharge Goals: Return home with outpatient referrals for mental health follow-up including medication management/psychotherapy  Lindell Spar, NP, pmhnp, fnp-bc. 07/07/2022, 3:59 PMPatient ID: Ferdinand Lango, female   DOB: 06-12-1970, 52 y.o.   MRN: [id number] Patient ID: SATARA TWIFORD, female   DOB: 09/15/1970, 52 y.o.   MRN: [id number] Patient ID: VENNA SHERMAN, female   DOB: August 01, 1970, 52 y.o.   MRN: [id number] Patient ID: JALEYAH ROSENGRANT, female   DOB: 13-Jun-1970, 52 y.o.   MRN: [id number]

## 2022-07-07 NOTE — Group Note (Signed)
Recreation Therapy Group Note   Group Topic:Problem Solving  Group Date: 07/07/2022 Start Time: 0930 End Time: 1015 Facilitators: Caylan Chenard-McCall, LRT,CTRS Location: [address] Dayroom   Goal Area(s) Addresses:  Patient will effectively work with peer towards shared goal.  Patient will identify skills used to make activity successful.  Patient will share challenges and verbalize solution-driven approaches used. Patient will identify how skills used during activity can be used to reach post d/c goals.    Group Description: [employer]. Patients were provided the following materials: 2 drinking straws, 5 rubber bands, 5 paper clips, 2 index cards and 2 drinking cups. Using the provided materials patients were asked to build a launching mechanism to launch a ping pong ball across the room, approximately 10 feet. Patients were divided into teams of 3-5. Instructions required all materials be incorporated into the device, functionality of items left to the peer group's discretion.   Affect/Mood: N/A   Participation Level: Did not attend    Clinical Observations/Individualized Feedback:     Plan: Continue to engage patient in RT group sessions 2-3x/week.   [place] Luppino-McCall, LRT,CTRS 07/07/2022 11:31 AM

## 2022-07-08 ENCOUNTER — Inpatient Hospital Stay (HOSPITAL_COMMUNITY): Payer: 59

## 2022-07-08 DIAGNOSIS — F250 Schizoaffective disorder, bipolar type: Secondary | ICD-10-CM | POA: Diagnosis not present

## 2022-07-08 DIAGNOSIS — F200 Paranoid schizophrenia: Secondary | ICD-10-CM | POA: Diagnosis not present

## 2022-07-08 LAB — COMPREHENSIVE METABOLIC PANEL
ALT: 17 U/L (ref 0–44)
AST: 20 U/L (ref 15–41)
Albumin: 3.1 g/dL — ABNORMAL LOW (ref 3.5–5.0)
Alkaline Phosphatase: 67 U/L (ref 38–126)
Anion gap: 8 (ref 5–15)
BUN: 13 mg/dL (ref 6–20)
CO2: 24 mmol/L (ref 22–32)
Calcium: 8.4 mg/dL — ABNORMAL LOW (ref 8.9–10.3)
Chloride: 101 mmol/L (ref 98–111)
Creatinine, Ser: 0.77 mg/dL (ref 0.44–1.00)
GFR, Estimated: >60 mL/min (ref >60)
Glucose, Bld: 92 mg/dL (ref 70–99)
Potassium: 4.7 mmol/L (ref 3.5–5.1)
Sodium: 133 mmol/L — ABNORMAL LOW (ref 135–145)
Total Bilirubin: 0.5 mg/dL (ref 0.3–1.2)
Total Protein: 6.4 g/dL — ABNORMAL LOW (ref 6.5–8.1)

## 2022-07-08 LAB — CBC
HCT: 35.8 % — ABNORMAL LOW (ref 36.0–46.0)
Hemoglobin: 11.3 g/dL — ABNORMAL LOW (ref 12.0–15.0)
MCH: 27.5 pg (ref 26.0–34.0)
MCHC: 31.6 g/dL (ref 30.0–36.0)
MCV: 87.1 fL (ref 80.0–100.0)
Platelets: 562 10*3/uL — ABNORMAL HIGH (ref 150–400)
RBC: 4.11 MIL/uL (ref 3.87–5.11)
RDW: 17.3 % — ABNORMAL HIGH (ref 11.5–15.5)
WBC: 10 10*3/uL (ref 4.0–10.5)
nRBC: 0 % (ref 0.0–0.2)

## 2022-07-08 LAB — LIPASE, BLOOD: Lipase: 42 U/L (ref 11–51)

## 2022-07-08 MED ORDER — MORPHINE SULFATE (PF) 4 MG/ML IV SOLN
4.0000 mg | Freq: Once | INTRAVENOUS | Status: AC
  Administered 2022-07-08: 4 mg via INTRAVENOUS
  Filled 2022-07-08: qty 1

## 2022-07-08 MED ORDER — ONDANSETRON HCL 4 MG/2ML IJ SOLN
4.0000 mg | Freq: Once | INTRAMUSCULAR | Status: AC
  Administered 2022-07-08: 4 mg via INTRAVENOUS
  Filled 2022-07-08: qty 2

## 2022-07-08 MED ORDER — KETOROLAC TROMETHAMINE 30 MG/ML IJ SOLN
30.0000 mg | Freq: Once | INTRAMUSCULAR | Status: AC
  Administered 2022-07-08: 30 mg via INTRAVENOUS
  Filled 2022-07-08: qty 1

## 2022-07-08 MED ORDER — LACTATED RINGERS IV BOLUS
1000.0000 mL | Freq: Once | INTRAVENOUS | Status: AC
  Administered 2022-07-08: 1000 mL via INTRAVENOUS

## 2022-07-08 MED ORDER — IOHEXOL 300 MG/ML  SOLN
100.0000 mL | Freq: Once | INTRAMUSCULAR | Status: AC | PRN
  Administered 2022-07-08: 100 mL via INTRAVENOUS

## 2022-07-08 NOTE — ED Provider Notes (Signed)
[place] EMERGENCY DEPARTMENT AT [facility] Provider Note   CSN: [id number] Arrival date & time: 07/08/22  1811     History  Chief Complaint  Patient presents with   Abdominal Pain   Nausea   Emesis    Kathryn [name] is a 52 y.o. female coming from behavioral health complaining of nausea, vomiting, and lower abdominal pain that began last night.  She states she has had difficulty keeping anything down today and her pain is worsening.  Denies fever, chills, diarrhea, constipation, melena, dysuria, syncope, weakness, dizziness.         Home Medications Prior to Admission medications   Medication Sig Start Date End Date Taking? Authorizing Provider  albuterol (PROVENTIL) (2.5 MG/3ML) 0.083% nebulizer solution Take 3 mLs (2.5 mg total) by nebulization every 6 (six) hours as needed for wheezing or shortness of breath. 0000000  Yes Delora Fuel, MD  albuterol (VENTOLIN HFA) 108 (90 Base) MCG/ACT inhaler Inhale 2 puffs into the lungs every 4 (four) hours as needed for wheezing or shortness of breath. 0000000  Yes Delora Fuel, MD  amLODipine (NORVASC) 5 MG tablet Take 1 tablet (5 mg total) by mouth daily. 02/02/22   Lindell Spar, MD  amoxicillin-clavulanate (AUGMENTIN) 875-125 MG tablet Take 1 tablet by mouth 2 (two) times daily. 06/16/22   [provider]  anagrelide (AGRYLIN) 1 MG capsule Take 1 capsule (1 mg total) by mouth 2 (two) times daily. 01/14/22   Larita Fife, MD  benzonatate (TESSALON) 200 MG capsule Take by mouth. 06/16/22   [provider]  [place] Patter [id number] MCG/ACT AEPB INHALE 1 PUFF EVERY DAY 03/06/22   Lindell Spar, MD  cholecalciferol (VITAMIN D3) 25 MCG (1000 UNIT) tablet Take 1,000 Units by mouth daily.    [provider]  citalopram (CELEXA) 20 MG tablet Take 20 mg by mouth daily. 04/12/22   [provider]  DULoxetine (CYMBALTA) 60 MG capsule Take 1 capsule (60 mg total) by mouth daily. 06/27/22 07/27/22  Lajean Saver, MD  furosemide (LASIX) 20 MG tablet Take 1 tablet (20 mg total) by mouth daily. 04/27/22   Mickie Hillier, PA-C  hydrOXYzine (VISTARIL) 25 MG capsule Take 4 capsules (100 mg total) by mouth every 6 (six) hours as needed. 06/27/22   Lajean Saver, MD  lubiprostone (AMITIZA) 24 MCG capsule Take 1 capsule (24 mcg total) by mouth 2 (two) times daily with a meal. 10/22/21   Nicholes Rough, NP  lurasidone (LATUDA) 40 MG TABS tablet Take 1 tablet (40 mg total) by mouth daily with breakfast. 06/27/22   Lajean Saver, MD  mometasone-formoterol (DULERA) 100-5 MCG/ACT AERO Inhale 2 puffs into the lungs 2 (two) times daily. 01/14/22   Larita Fife, MD  omeprazole (PRILOSEC) 20 MG capsule Take 20 mg by mouth daily. 12/22/21   [provider]  pantoprazole (PROTONIX) 40 MG tablet Take 1 tablet (40 mg total) by mouth daily. 01/14/22   Larita Fife, MD  potassium chloride SA (KLOR-CON M) 20 MEQ tablet Take 1 tablet (20 mEq total) by mouth daily. TAKE (1) TABLET BY MOUTH ONCE A DAY. Strength: 20 mEq 12/16/21   Lindell Spar, MD  predniSONE (DELTASONE) 50 MG tablet Take 1 tablet (50 mg total) by mouth daily. 0000000   Delora Fuel, MD  pregabalin (LYRICA) 150 MG capsule Take 1 capsule (150 mg total) by mouth 2 (two) times daily. 0000000   Delora Fuel, MD  risperiDONE (RISPERDAL) 0.5 MG tablet Take 0.5 mg  by mouth 2 (two) times daily. 04/12/22   [provider]  Vitamin D, Ergocalciferol, (DRISDOL) 1.25 MG (50000 UNIT) CAPS capsule TAKE 1 CAPSULE THE SAME DAY EACH WEEK. 04/13/22   Lindell Spar, MD      Allergies    Fish allergy, Flexeril [cyclobenzaprine hcl], Ibuprofen, Shellfish allergy, Tylenol [acetaminophen], Ace inhibitors, Peanut allergen powder-dnfp, Tramadol, and Trazodone and nefazodone    Review of Systems   Review of Systems  Constitutional:  Negative for chills and fever.  Gastrointestinal:  Positive for abdominal pain, nausea and vomiting. Negative for blood in stool,  constipation and diarrhea.  Genitourinary:  Negative for dysuria.  Neurological:  Negative for dizziness, syncope and weakness.    Physical Exam Updated Vital Signs BP 127/81   Pulse 85   Temp 98.1 F (36.7 C)   Resp 15   Ht 4\' 11"  (1.499 m)   Wt 86.6 kg   LMP 06/07/2020   SpO2 98%   BMI 38.58 kg/m  Physical Exam Vitals and nursing note reviewed.  Constitutional:      General: She is not in acute distress.    Appearance: She is not ill-appearing.  HENT:     Mouth/Throat:     Mouth: Mucous membranes are moist.     Pharynx: Oropharynx is clear.  Cardiovascular:     Rate and Rhythm: Normal rate and regular rhythm.     Pulses: Normal pulses.     Heart sounds: Normal heart sounds.  Pulmonary:     Effort: Pulmonary effort is normal. No respiratory distress.     Breath sounds: Normal breath sounds and air entry.  Abdominal:     General: Abdomen is flat. Bowel sounds are normal. There is no distension.     Palpations: Abdomen is soft.     Tenderness: There is generalized abdominal tenderness.     Hernia: No hernia is present.  Skin:    General: Skin is warm and dry.     Capillary Refill: Capillary refill takes less than 2 seconds.  Neurological:     Mental Status: She is alert. Mental status is at baseline.  Psychiatric:        Mood and Affect: Mood normal.        Behavior: Behavior normal.     ED Results / Procedures / Treatments   Labs (all labs ordered are listed, but only abnormal results are displayed) Labs Reviewed  RESPIRATORY PANEL BY PCR - Abnormal; Notable for the following components:      Result Value   Rhinovirus / Enterovirus DETECTED (*)    All other components within normal limits  COMPREHENSIVE METABOLIC PANEL - Abnormal; Notable for the following components:   Sodium 133 (*)    Calcium 8.4 (*)    Total Protein 6.4 (*)    Albumin 3.1 (*)    All other components within normal limits  CBC - Abnormal; Notable for the following components:    Hemoglobin 11.3 (*)    HCT 35.8 (*)    RDW 17.3 (*)    Platelets 562 (*)    All other components within normal limits  RESP PANEL BY RT-PCR (RSV, FLU A&B, COVID)  [id number]  LIPASE, BLOOD    EKG None  Radiology CT ABDOMEN PELVIS W CONTRAST  Result Date: 07/08/2022 CLINICAL DATA:  Acute abdominal pain the left EXAM: CT ABDOMEN AND PELVIS WITH CONTRAST TECHNIQUE: Multidetector CT imaging of the abdomen and pelvis was performed using the standard protocol following bolus administration of  intravenous contrast. RADIATION DOSE REDUCTION: This exam was performed according to the departmental dose-optimization program which includes automated exposure control, adjustment of the mA and/or kV according to patient size and/or use of iterative reconstruction technique. CONTRAST:  183mL OMNIPAQUE IOHEXOL 300 MG/ML  SOLN COMPARISON:  09/14/2021 FINDINGS: Lower chest: No acute abnormality. Hepatobiliary: No focal liver abnormality is seen. No gallstones, gallbladder wall thickening, or biliary dilatation. Pancreas: Unremarkable. No pancreatic ductal dilatation or surrounding inflammatory changes. Spleen: Normal in size without focal abnormality. Adrenals/Urinary Tract: Adrenal glands are within normal limits. Kidneys demonstrate a normal enhancement pattern bilaterally. No obstructive changes are seen. The bladder is well distended. Stomach/Bowel: Appendix is well visualized. No obstructive or inflammatory changes of the colon are seen. Stomach is within normal limits. Small bowel is within normal limits with the exception of a few mildly dilated loops of small bowel in the left mid abdomen likely in the distal jejunum. No obstructing lesion is seen. This may represent some mild enteritis. Vascular/Lymphatic: Aortic atherosclerosis. No enlarged abdominal or pelvic lymph nodes. Reproductive: Uterus is bulky with evidence of uterine fibroids. Additionally the cervix is somewhat bulky and heterogeneous in attenuation. No  adnexal mass is noted. Other: No abdominal wall hernia or abnormality. No abdominopelvic ascites. Musculoskeletal: No acute osseous findings. Postsurgical changes are noted from L3-L5. IMPRESSION: A few mildly dilated loops of distal jejunum which may represent mild enteritis. No definitive obstructive changes are seen. Uterine fibroids with bulky cervix. Direct visualization may be helpful. Electronically Signed   By: Inez Catalina M.D.   On: 07/08/2022 21:31    Procedures Procedures    Medications Ordered in ED Medications  alum & mag hydroxide-simeth (MAALOX/MYLANTA) 200-200-20 MG/5ML suspension 30 mL (30 mLs Oral Given 07/02/22 1435)  magnesium hydroxide (MILK OF MAGNESIA) suspension 30 mL (30 mLs Oral Given 07/02/22 0800)  haloperidol (HALDOL) tablet 5 mg (has no administration in time range)    Or  haloperidol lactate (HALDOL) injection 5 mg (has no administration in time range)  LORazepam (ATIVAN) tablet 2 mg (has no administration in time range)    Or  LORazepam (ATIVAN) injection 2 mg (has no administration in time range)  diphenhydrAMINE (BENADRYL) capsule 50 mg (has no administration in time range)    Or  diphenhydrAMINE (BENADRYL) injection 50 mg (has no administration in time range)  albuterol (PROVENTIL) (2.5 MG/3ML) 0.083% nebulizer solution 2.5 mg (has no administration in time range)  amLODipine (NORVASC) tablet 5 mg (5 mg Oral Given 07/08/22 0817)  fluticasone furoate-vilanterol (BREO ELLIPTA) 200-25 MCG/ACT 1 puff (1 puff Inhalation Given 07/08/22 0829)  cholecalciferol (VITAMIN D3) 25 MCG (1000 UNIT) tablet 1,000 Units (1,000 Units Oral Given 07/08/22 0817)  DULoxetine (CYMBALTA) DR capsule 60 mg (60 mg Oral Given 07/08/22 0817)  pregabalin (LYRICA) capsule 150 mg (150 mg Oral Given 07/08/22 1654)  hydrOXYzine (ATARAX) tablet 100 mg (100 mg Oral Given 07/07/22 2142)  nicotine (NICODERM CQ - dosed in mg/24 hours) patch 14 mg (14 mg Transdermal Patch Applied 07/08/22 0828)   lidocaine (LIDODERM) 5 % 1 patch (1 patch Transdermal Patch Applied 07/08/22 1225)  pantoprazole (PROTONIX) EC tablet 40 mg (40 mg Oral Given 07/08/22 0817)  cloNIDine (CATAPRES) tablet 0.1 mg (has no administration in time range)  methocarbamol (ROBAXIN) tablet 500 mg (500 mg Oral Given 07/08/22 1659)  lurasidone (LATUDA) tablet 60 mg (60 mg Oral Given 07/08/22 0817)  menthol-cetylpyridinium (CEPACOL) lozenge 3 mg (3 mg Oral Given 07/07/22 0802)  ramelteon (ROZEREM) tablet 8 mg (8 mg  Oral Given 07/08/22 2238)  ondansetron (ZOFRAN-ODT) disintegrating tablet 4 mg (4 mg Oral Given 07/08/22 1609)  ondansetron (ZOFRAN) injection 4 mg (4 mg Intravenous Given 07/08/22 2007)  lactated ringers bolus 1,000 mL (1,000 mLs Intravenous New Bag/Given 07/08/22 2007)  morphine (PF) 4 MG/ML injection 4 mg (4 mg Intravenous Given 07/08/22 2012)  iohexol (OMNIPAQUE) 300 MG/ML solution 100 mL (100 mLs Intravenous Contrast Given 07/08/22 2051)  ketorolac (TORADOL) 30 MG/ML injection 30 mg (30 mg Intravenous Given 07/08/22 2238)    ED Course/ Medical Decision Making/ A&P                             Medical Decision Making Amount and/or Complexity of Data Reviewed Labs: ordered. Radiology: ordered.  Risk Prescription drug management.   This patient presents to the ED with chief complaint(s) of nausea, vomiting, and lower abdominal pain with pertinent past medical history of HTN, GERD, COPD, schizoaffective disorder bipolar type, polysubstance abuse.  The complaint involves an extensive differential diagnosis and also carries with it a high risk of complications and morbidity.    The differential diagnosis includes gastritis, gastroenteritis, diverticulitis, appendicitis, pancreatitis, norovirus, GERD   The initial plan is to obtain abdominal pain work up  Additional history obtained: Records reviewed  - patient currently under treatment at [facility] for schizoaffective disorder, bipolar type.  Patient was sent here from  [facility] due to acute problem.  Initial Assessment:   Exam significant for patient who appears uncomfortable, but is not in acute distress.  Heart rate is normal in the 80s.  Abdomen is soft with tenderness to lower quadrants.  No appreciable hernias.  Bowel sounds are normal.  Skin is warm and dry.  Patient not actively dry heaving or vomiting during initial assessment.    Independent ECG/labs interpretation:  The following labs were independently interpreted:  CBC without leukocytosis.  There is anemia, which patient appears to have had in the past.  Metabolic panel without major electrolyte derangement, there is mild hyponatremia.  Calcium normal when corrected.  LFTs and renal function both normal.   Lipase within normal range, not suggestive of pancreatitis.  Respiratory pathogens panel, ordered by [facility] positive for Rhinovirus.  Resp panel in ED negative for COVID, flu, and RSV.   Independent visualization and interpretation of imaging: I independently visualized the following imaging with scope of interpretation limited to determining acute life threatening conditions related to emergency care: CT abdomen/pelvis, which revealed possible mild enteritis and uterine fibroids.  I agree with radiologist interpretation.   Treatment and Reassessment: Patient's nausea successfully treated with IV Zofran.  She was also given morphine and toradol for pain.  She reports she does still have some discomfort, but was able to pass a PO challenge.  I feel that patient's symptoms are likely of viral etiology and she is appropriate for discharge back to [facility].  Disposition:   Patient to be transferred back to [facility].    The patient has been appropriately medically screened and/or stabilized in the ED. I have low suspicion for any other emergent medical condition which would require further screening, evaluation or treatment in the ED or require inpatient management. At time of discharge the patient is hemodynamically  stable and in no acute distress. I have discussed work-up results and diagnosis with patient and answered all questions. Patient is agreeable with discharge plan. We discussed strict return precautions for returning to the emergency department and they verbalized understanding.  Social Determinants of Health:   Patient's unhoused, polysubstance abuse  increases the complexity of managing their presentation         Final Clinical Impression(s) / ED Diagnoses Final diagnoses:  Lower abdominal pain  Nausea and vomiting, unspecified vomiting type    Rx / DC Orders ED Discharge Orders     None         Pat Kocher, [place] 07/08/22 2317    Lacretia Leigh, MD 07/09/22 1606

## 2022-07-08 NOTE — ED Triage Notes (Signed)
Coming from [facility]. C/o of LLQ abd pain. +n/v since last night. 115 cbg

## 2022-07-08 NOTE — Progress Notes (Signed)
[facility] MD Progress Note  07/08/2022 9:23 AM Kathryn [name]  MRN:  [id number]  Principal Problem: Schizoaffective disorder, bipolar type ([place])  Diagnosis: Principal Problem:   Schizoaffective disorder, bipolar type ([place]) Active Problems:   GAD (generalized anxiety disorder)   Cocaine use disorder ([place])   Homelessness  Reason for Admission: TELETHIA STROZIER is a 52 y.o. female w/ hx of cocaine induced mood disorder, MDD who presented to [facility] voluntarily from [place] due to [place] in setting of cocaine abuse.  This is hospitalization day 8.  24-hour chart review: Past 24 hours of patient's chart was reviewed.  Patient is compliant with scheduled meds. Required Agitation PRNs: None Per RN notes, no documented behavioral issues and is not attending group because she is currently in her room due to rhino virus infection. Patient slept, 8 hours. Probable discharge on Monday.  Daily notes: Kathryn [name] is seen in her room this morning. Chart reviewed. The chart findings discussed with the treatment team. She is lying down in bed. She presents alert, oriented & aware of situation. She is not visible on the unit, currently in her room due to rhino virus infection. She is wearing a mask. She continues to use walker to aid her ambulation & balance due to lower extremity pain/weakness. Kathryn [name] reports, "I'm feeling a lot better. I have no complaints today. I'm getting myself prepared for discharge on Monday. I have not been coughing any more". Kathryn [name] currently denies any SIHI, AVH, delusional thoughts or paranoia. She denies any coughs or congestions. She denies any SOB. She remains in her room due to the Rhino virus infection. Possible discharge on Monday. There are no changes made on her current plan of care. Vital signs, stable. We continue current plan of care as already in progress.  Total Time spent with patient:  25 minutes  Past Psychiatric History:  Previous Psych Diagnoses: MDD, cocaine-induced mood disorder,  polysubstance abuse, schizoaffective disorder bipolar type Prior inpatient treatment: She was hospitalized at [facility] from 10/17/21 to 12/07/21; & 01/09/19 23-10 /04/2021. She has also been admitted to [facility] last year; Current/prior outpatient treatment: She receives her current outpatient psychiatric care from [facility] in [place].  Psychotherapy hx: unknown History of suicide: previous attempt September 2020 by consuming bleach and vinegar, Yes, 2019 patient cut wrist, 2023 patient drank Clorox and vinegar, 2024 patient drank Clorox. History of homicide: none Psychiatric medication history: Duloxetine 30 mg, Benztropine 0.5 mg, citalopram 20 mg,  Psychiatric medication compliance history: poor compliance Prior inpatient treatment: Yes, see above Current/prior outpatient treatment: Yes Prior rehab hx: Patient denies Psychiatric medication history: Yes Neuromodulation history: Denies Current Psychiatrist: Dr.  Theophilus Bones Current therapist: Patient denies  Past Medical History:  Past Medical History:  Diagnosis Date   Anxiety    Arthritis    Asthma    Bipolar disorder ([place])    Chronic back pain    COPD (chronic obstructive pulmonary disease) (HCC)    Chronic bronchitis   Depression    Dyspnea    GERD (gastroesophageal reflux disease)    Hypertension    Migraine    Neuropathy    Osteoarthritis of left knee, 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   Single subsegmental pulmonary embolism without acute cor pulmonale ([place]) 06/20/2021   Sleep apnea 04/2020   GETTING A cpap   Suicidal ideation 12/24/2018    Past Surgical History:  Procedure Laterality Date   BACK SURGERY     DILATATION AND CURETTAGE/HYSTEROSCOPY WITH MINERVA N/A 06/09/2020   Procedure: DILATATION AND CURETTAGE/HYSTEROSCOPY  WITH MINERVA;  Surgeon: Florian Buff, MD;  Location: [facility] ORS;  Service: Gynecology;  Laterality: N/A;   ESOPHAGOGASTRODUODENOSCOPY (EGD) WITH PROPOFOL N/A 12/25/2018   Procedure: ESOPHAGOGASTRODUODENOSCOPY  (EGD) WITH PROPOFOL;  Surgeon: Rogene Houston, MD;  Location: [facility] ENDO SUITE;  Service: Endoscopy;  Laterality: N/A;   FLEXIBLE SIGMOIDOSCOPY  10/14/2021   Procedure: FLEXIBLE SIGMOIDOSCOPY;  Surgeon: Harvel Quale, MD;  Location: [facility] ENDO SUITE;  Service: Gastroenterology;;   PATELLA-FEMORAL ARTHROPLASTY Left 10/08/2018   Procedure: PATELLA-FEMORAL ARTHROPLASTY;  Surgeon: Marchia Bond, MD;  Location: [facility] ORS;  Service: Orthopedics;  Laterality: Left;   TUBAL LIGATION     Family History:  Family History  Problem Relation Age of Onset   Gout Paternal Grandfather    Cirrhosis Paternal Grandfather    Hypertension Paternal Grandmother    Aneurysm Paternal Grandmother    Cirrhosis Maternal Grandmother    Cirrhosis Maternal Grandfather    Cancer Father    Cirrhosis Father    Cirrhosis Mother    Breast cancer Sister    Hypertension Sister    Bronchitis Daughter    Bronchitis Daughter    Asthma Son    Bronchitis Son    Migraines Neg Hx    Social History:  Social History   Substance and Sexual Activity  Alcohol Use Not Currently   Comment: started drinking again last night while at a party as of 06/29/22     Social History   Substance and Sexual Activity  Drug Use Yes   Types: Cocaine    Social History   Socioeconomic History   Marital status: Single    Spouse name: Not on file   Number of children: 3   Years of education: 10   Highest education level: 10th grade  Occupational History   Not on file  Tobacco Use   Smoking status: Every Day    Packs/day: 0.25    Years: 10.00    Additional pack years: 0.00    Total pack years: 2.50    Types: Cigarettes    Last attempt to quit: 04/2020    Years since quitting: 2.2    Passive exposure: Never   Smokeless tobacco: Never   Tobacco comments:    Smokes often, especially when using crack cocaine  Vaping Use   Vaping Use: Never used  Substance and Sexual Activity   Alcohol use: Not Currently    Comment:  started drinking again last night while at a party as of 06/29/22   Drug use: Yes    Types: Cocaine   Sexual activity: Yes    Birth control/protection: Surgical    Comment: tubal, ablation  Other Topics Concern   Not on file  Social History Narrative   R handed    Lives with boyfriend   1 Cup of caffeine daily    Social Determinants of Health   Financial Resource Strain: Not on file  Food Insecurity: Food Insecurity Present (06/30/2022)   Hunger Vital Sign    Worried About Running Out of Food in the Last Year: Often true    Ran Out of Food in the Last Year: Often true  Transportation Needs: Unmet Transportation Needs (06/30/2022)   PRAPARE - Hydrologist (Medical): Yes    Lack of Transportation (Non-Medical): Yes  Physical Activity: Not on file  Stress: Not on file  Social Connections: Not on file   Additional Social History:    Current Medications: Current Facility-Administered Medications  Medication Dose Route  Frequency Provider Last Rate Last Admin   albuterol (PROVENTIL) (2.5 MG/3ML) 0.083% nebulizer solution 2.5 mg  2.5 mg Nebulization Q6H PRN Lucky Rathke, FNP       alum & mag hydroxide-simeth (MAALOX/MYLANTA) 200-200-20 MG/5ML suspension 30 mL  30 mL Oral Q4H PRN Lucky Rathke, FNP   30 mL at 07/02/22 1435   amLODipine (NORVASC) tablet 5 mg  5 mg Oral Daily Lucky Rathke, FNP   5 mg at 07/08/22 [id number]   cholecalciferol (VITAMIN D3) 25 MCG (1000 UNIT) tablet 1,000 Units  1,000 Units Oral Daily Lucky Rathke, FNP   1,000 Units at 07/08/22 [id number]   cloNIDine (CATAPRES) tablet 0.1 mg  0.1 mg Oral Q4H PRN Massengill, Ovid Curd, MD       diphenhydrAMINE (BENADRYL) capsule 50 mg  50 mg Oral TID PRN Lucky Rathke, FNP       Or   diphenhydrAMINE (BENADRYL) injection 50 mg  50 mg Intramuscular TID PRN Lucky Rathke, FNP       DULoxetine (CYMBALTA) DR capsule 60 mg  60 mg Oral Daily Lucky Rathke, FNP   60 mg at 07/08/22 0817   fluticasone furoate-vilanterol  (BREO ELLIPTA) 200-25 MCG/ACT 1 puff  1 puff Inhalation Daily Lucky Rathke, FNP   1 puff at 07/08/22 [id number]   haloperidol (HALDOL) tablet 5 mg  5 mg Oral TID PRN Lucky Rathke, FNP       Or   haloperidol lactate (HALDOL) injection 5 mg  5 mg Intramuscular TID PRN Lucky Rathke, FNP       hydrOXYzine (ATARAX) tablet 100 mg  100 mg Oral Q6H PRN Massengill, Ovid Curd, MD   100 mg at 07/07/22 2142   lidocaine (LIDODERM) 5 % 1 patch  1 patch Transdermal Q24H France Ravens, MD   1 patch at 07/07/22 1722   LORazepam (ATIVAN) tablet 2 mg  2 mg Oral TID PRN Lucky Rathke, FNP       Or   LORazepam (ATIVAN) injection 2 mg  2 mg Intramuscular TID PRN Lucky Rathke, FNP       lurasidone (LATUDA) tablet 60 mg  60 mg Oral Q breakfast Ntuen, Tina C, FNP   60 mg at 07/08/22 0817   magnesium hydroxide (MILK OF MAGNESIA) suspension 30 mL  30 mL Oral Daily PRN Lucky Rathke, FNP   30 mL at 07/02/22 0800   menthol-cetylpyridinium (CEPACOL) lozenge 3 mg  1 lozenge Oral PRN Massengill, Ovid Curd, MD   3 mg at 07/07/22 0802   methocarbamol (ROBAXIN) tablet 500 mg  500 mg Oral Q8H PRN Massengill, Ovid Curd, MD   500 mg at 07/07/22 2142   nicotine (NICODERM CQ - dosed in mg/24 hours) patch 14 mg  14 mg Transdermal Daily Massengill, Ovid Curd, MD   14 mg at 07/08/22 0828   ondansetron (ZOFRAN-ODT) disintegrating tablet 4 mg  4 mg Oral Q8H PRN Lindell Spar I, NP   4 mg at 07/06/22 1131   pantoprazole (PROTONIX) EC tablet 40 mg  40 mg Oral Daily France Ravens, MD   40 mg at 07/08/22 0817   pregabalin (LYRICA) capsule 150 mg  150 mg Oral BID Lucky Rathke, FNP   150 mg at 07/08/22 0817   ramelteon (ROZEREM) tablet 8 mg  8 mg Oral QHS Massengill, Ovid Curd, MD   8 mg at 07/07/22 2142   Lab Results:  No results found for this or any previous visit (from the past 24 hour(s)).  Blood Alcohol level:  Lab Results  Component Value Date   ETH <10 06/29/2022   ETH <10 [id number]   Metabolic Disorder Labs: Lab Results  Component Value Date    HGBA1C 5.5 01/10/2022   MPG 111.15 01/10/2022   MPG 111.15 10/19/2021   No results found for: "PROLACTIN" Lab Results  Component Value Date   CHOL 168 01/10/2022   TRIG 56 01/10/2022   HDL 57 01/10/2022   CHOLHDL 2.9 01/10/2022   VLDL 11 01/10/2022   LDLCALC 100 (H) 01/10/2022   LDLCALC 84 10/19/2021   Physical Findings:  Musculoskeletal: Strength & Muscle Tone: within normal limits Gait & Station: normal  Psychiatric Specialty Exam:  Presentation  General Appearance:  Appropriate for Environment; Casual; Fairly Groomed  Eye Contact: Good  Speech: Clear and Coherent  Speech Volume: Normal  Handedness: Right  Mood and Affect  Mood: -- (Improving)  Affect: Congruent  Thought Process  Thought Processes: Coherent; Goal Directed; Linear  Descriptions of Associations:Intact  Orientation:Full (Time, Place and Person)  Thought Content:Logical  History of Schizophrenia/Schizoaffective disorder:No  Duration of Psychotic Symptoms:N/A  Hallucinations:Hallucinations: None Description of Auditory Hallucinations: NA Description of Visual Hallucinations: NA  Ideas of Reference:None  Suicidal Thoughts:Suicidal Thoughts: No SI Active Intent and/or Plan: Without Intent; Without Means to Carry Out; Without Access to Means SI Passive Intent and/or Plan: Without Intent; Without Plan; Without Means to Carry Out; Without Access to Means  Homicidal Thoughts:Homicidal Thoughts: No HI Active Intent and/or Plan: Without Intent; Without Plan; Without Means to Carry Out; Without Access to Means  Sensorium  Memory: Immediate Good; Recent Good; Remote Good  Judgment: Good  Insight: Fair  Executive Functions  Concentration: Good  Attention Span: Good  Recall: Good  Fund of Knowledge: Good  Language: Good  Psychomotor Activity  Psychomotor Activity:Psychomotor Activity: Normal  Assets  Assets: Communication Skills; Desire for Improvement;  Financial Resources/Insurance; Physical Health; Resilience; Social Support  Sleep  Sleep:Sleep: Good Number of Hours of Sleep: 8  Physical Exam: Review of Systems  Constitutional: Negative.   HENT: Negative.    Eyes: Negative.   Respiratory:  Negative for shortness of breath.   Cardiovascular: Negative.  Negative for chest pain.  Gastrointestinal:  Negative for abdominal pain, constipation, diarrhea, heartburn, nausea and vomiting.  Genitourinary: Negative.   Musculoskeletal:  Positive for back pain (s/p back surgery).  Neurological:  Negative for headaches.  Endo/Heme/Allergies: Negative.   Psychiatric/Behavioral:  Positive for depression (improving) and substance abuse (hx cocaine use disorder.). Negative for hallucinations, memory loss and suicidal ideas. The patient is not nervous/anxious and does not have insomnia.    Blood pressure 110/61, pulse 84, temperature 97.7 F (36.5 C), resp. rate 16, height 4\' 11"  (1.499 m), weight 86.6 kg, last menstrual period 06/07/2020, SpO2 99 %. Body mass index is 38.58 kg/m.  Patient's mood symptoms predominantly correlated with her cocaine use and housing.  She does not exhibit any psychotic nor depressive symptoms outside of it.  LCSW to provide substance use housing resources to aid with patient's homelessness which seems to be a primary source of her distress and mood symptoms.   Daily contact with patient to assess and evaluate symptoms and progress in treatment and Medication management.    Continue inpatient hospitalization.  Will continue today 07/08/2022 plan as below except where it is noted.   PLAN Safety and Monitoring: Voluntary admission to inpatient psychiatric unit for safety, stabilization and treatment Daily contact with patient to assess and evaluate symptoms and progress in  treatment Patient's case to be discussed in multi-disciplinary team meeting Observation Level : q15 minute checks Vital signs: q12 hours Precautions:  suicide, elopement, and assault  2. Psychiatric Problems MDD-Severe, recurrent episode without psychosis Stimulant use disorder-cocaine Nicotine use disorder Housing Instability.   --Continue Cymbalta DR Capsule 60 mg p.o. daily for depression --Continue Latuda tablets 60 mg daily with breakfast for bipolar d/o. --Continue hydroxyzine 100 mg p.o. every 6 hours as needed for anxiety --Nicotine patch for NRT.  --Continue Rozerem 8 mg po Q hs for insomnia.    Agitation protocols: Cont as recommended;  -Benadryl 50 mg po or IM tid prn. -Haldol 5 mg po or IM tid prn.  -Lorazepam 2 mg po or IM tid prn.  Medical Problems Hypertension Likely also secondary to cocaine use 121/70 today -Amlodipine 5 mg daily  Asthma -Recent exacerbation and placed on 5 day course of prednisone on 06/27/22 -Fluticasone furoate-vilanterol 200-25 mcg/ACT 1 puff daily  -Completed 5 day course of prednisone today (07/02/22).   Back Pain -Lyrica 150 mg twice daily -Lidocaine patch trans-dermally for pain.  -Continue Robaxin 500 mg po tid prn for muscle spasms.   GERD.  Continue Protonix 40 mg po Q am for GERD.  PRNs -Tylenol 650 mg for mild pain -Maalox/Mylanta 30 mL for indigestion -Hydroxyzine 25 mg tid for anxiety -Milk of Magnesia 30 mL for constipation.  -Continue clonidine tablet 0.1 mg po Q 4 hours as needed if systolic blood pressure over [id number] or if diastolic blood pressure over 100    4. Discharge Planning: Social work and case management to assist with discharge planning and identification of hospital follow-up needs prior to discharge Estimated LOS: 3 days Discharge Concerns: Need to establish a safety plan; Medication compliance and effectiveness Discharge Goals: Return home with outpatient referrals for mental health follow-up including medication management/psychotherapy  Lindell Spar, NP, pmhnp, fnp-bc. 07/08/2022, 9:23 AMPatient ID: Ferdinand Lango, female   DOB: 1970/08/03, 52 y.o.    MRN: [id number] Patient ID: ANDREYA WINGROVE, female   DOB: 1970-11-12, 52 y.o.   MRN: [id number] Patient ID: LAURABETH DAYAN, female   DOB: December 07, 1970, 53 y.o.   MRN: [id number] Patient ID: DESHONDA BOWN, female   DOB: Aug 15, 1970, 52 y.o.   MRN: [id number] Patient ID: VELENA MCVEIGH, female   DOB: Jan 15, 1971, 52 y.o.   MRN: [id number]

## 2022-07-08 NOTE — ED Notes (Signed)
Pt tolerating fluids.   

## 2022-07-08 NOTE — Progress Notes (Addendum)
Patient complaining of vomiting and stomach pain rated 10/10. Vitals are stable. Zofran administered with no relief. MD notified and gave order to send pt to the ED. 911 called for transport.

## 2022-07-08 NOTE — Progress Notes (Signed)
Patient taken to ED by EMS.

## 2022-07-08 NOTE — Discharge Instructions (Addendum)
Call this clinic to make a pcp appt:  [place]  Phone: [phone]  Address: [address],  [place], [place] [address]   Hours: Monday to Friday; 8:30AM - 5PM     -Follow-up with your outpatient psychiatric provider -instructions on appointment date, time, and address (location) are provided to you in discharge paperwork.  -Take your psychiatric medications as prescribed at discharge - instructions are provided to you in the discharge paperwork  -Follow-up with outpatient primary care doctor and other specialists -for management of preventative medicine and any chronic medical disease.  -Recommend abstinence from alcohol, tobacco, and other illicit drug use at discharge.   -If your psychiatric symptoms recur, worsen, or if you have side effects to your psychiatric medications, call your outpatient psychiatric provider, 911, [phone] or go to the nearest emergency department.  -If suicidal thoughts occur, call your outpatient psychiatric provider, 911, [phone] or go to the nearest emergency department.  Naloxone (Narcan) can help reverse an overdose when given to the victim quickly.  [facility] offers free naloxone kits and instructions/training on its use.  Add naloxone to your first aid kit and you can help save a life.   Pick up your free kit at the following locations:   [place]:  [place], [place] [address] [phone]) [facility] [place] [id number] [phone]) [facility] [place]  [place]: [place] [id number] [place] [id number] [phone]) [facility] [place] [address] [phone])

## 2022-07-08 NOTE — Progress Notes (Signed)
D: Patient is alert, oriented, pleasant, and cooperative. Denies SI, HI, AVH, and verbally contracts for safety.    A: Scheduled medications administered per MD order. Support provided. Patient educated on safety on the unit and medications. Routine safety checks every 15 minutes. Patient stated understanding to tell nurse about any new physical symptoms. Patient understands to tell staff of any needs. Patient remains isolated in her room.    R: No adverse drug reactions noted. Patient verbally contracts for safety. Patient remains safe at this time and will continue to monitor.    07/08/22 0900  Psych Admission Type (Psych Patients Only)  Admission Status Voluntary  Psychosocial Assessment  Patient Complaints Malaise  Eye Contact Fair  Facial Expression Animated  Affect Depressed  Speech Logical/coherent  Interaction Assertive  Motor Activity Slow  Appearance/Hygiene Unremarkable  Behavior Characteristics Cooperative;Appropriate to situation  Mood Pleasant  Thought Process  Coherency WDL  Content WDL  Delusions None reported or observed  Perception WDL  Hallucination None reported or observed  Judgment Poor  Confusion None  Danger to Self  Current suicidal ideation? Denies  Self-Injurious Behavior No self-injurious ideation or behavior indicators observed or expressed   Agreement Not to Harm Self Yes  Description of Agreement verbal  Danger to Others  Danger to Others None reported or observed

## 2022-07-08 NOTE — ED Notes (Signed)
Pt has sitter from [facility] accompanying her

## 2022-07-08 NOTE — ED Notes (Signed)
Safe transport set up for to return to [facility].

## 2022-07-09 DIAGNOSIS — F1499 Cocaine use, unspecified with unspecified cocaine-induced disorder: Secondary | ICD-10-CM

## 2022-07-09 DIAGNOSIS — F411 Generalized anxiety disorder: Secondary | ICD-10-CM

## 2022-07-09 DIAGNOSIS — Z5900 Homelessness unspecified: Secondary | ICD-10-CM

## 2022-07-09 NOTE — Progress Notes (Signed)
[facility] MD Progress Note  07/09/2022 8:56 AM Kathryn [name]  MRN:  [id number]  Principal Problem: Schizoaffective disorder, bipolar type ([place])  Diagnosis: Principal Problem:   Schizoaffective disorder, bipolar type ([place]) Active Problems:   GAD (generalized anxiety disorder)   Cocaine use disorder ([place])   Homelessness  Reason for Admission: Kathryn [name] is a 52 y.o. female w/ hx of cocaine induced mood disorder, MDD who presented to [facility] voluntarily from [place] due to [place] in setting of cocaine abuse.  This is hospitalization day 9.  24-hour chart review: Past 24 hours of patient's chart was reviewed.  Patient is compliant with scheduled meds. Required Agitation PRNs: None Per RN notes, no documented behavioral issues and is not attending group because she is currently in her room due to rhino virus infection. Patient slept, 8 hours. Probable discharge on Monday.  Daily notes: Kathryn [name] is seen in her room this morning. Chart reviewed. The chart findings discussed with the treatment team. She is lying down in bed. She presents alert, oriented & aware of situation. She is not visible on the unit, currently in her room due to rhino virus infection. However, this quarantine should end today. She was sent to the ED last night for evaluation due to nausea/vomiting/abdominal pain not responding to some remedies administered. It appears CT-scan of the abdomen was done while at the ED. Here is the IMPRESSION: A few mildly dilated loops of distal jejunum which may represent mild enteritis. No definitive obstructive changes are seen. Patient during this evaluation says she is doing & feeling much better. She currently denies any abdominal pain, nausea or vomiting since returning from the ED. She continues to deny any symptoms of depression or anxiety. She denies any cough, congestion or shortness of breath. She is sleeping well. Her appetite is good. She is showing readiness for discharge. Says her daughter will  be picking her up after discharge. There are no changes made on her current plan of care. Will continue as already in progress.    Total Time spent with patient:  25 minutes  Past Psychiatric History:  Previous Psych Diagnoses: MDD, cocaine-induced mood disorder, polysubstance abuse, schizoaffective disorder bipolar type Prior inpatient treatment: She was hospitalized at [facility] from 10/17/21 to 12/07/21; & 01/09/19 23-10 /04/2021. She has also been admitted to [facility] last year; Current/prior outpatient treatment: She receives her current outpatient psychiatric care from [facility] in [place].  Psychotherapy hx: unknown History of suicide: previous attempt September 2020 by consuming bleach and vinegar, Yes, 2019 patient cut wrist, 2023 patient drank Clorox and vinegar, 2024 patient drank Clorox. History of homicide: none Psychiatric medication history: Duloxetine 30 mg, Benztropine 0.5 mg, citalopram 20 mg,  Psychiatric medication compliance history: poor compliance Prior inpatient treatment: Yes, see above Current/prior outpatient treatment: Yes Prior rehab hx: Patient denies Psychiatric medication history: Yes Neuromodulation history: Denies Current Psychiatrist: Dr.  Theophilus Bones Current therapist: Patient denies  Past Medical History:  Past Medical History:  Diagnosis Date   Anxiety    Arthritis    Asthma    Bipolar disorder ([place])    Chronic back pain    COPD (chronic obstructive pulmonary disease) (HCC)    Chronic bronchitis   Depression    Dyspnea    GERD (gastroesophageal reflux disease)    Hypertension    Migraine    Neuropathy    Osteoarthritis of left knee, 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   Single subsegmental pulmonary embolism without acute cor pulmonale ([place]) 06/20/2021   Sleep apnea  04/2020   GETTING A cpap   Suicidal ideation 12/24/2018    Past Surgical History:  Procedure Laterality Date   BACK SURGERY     DILATATION AND CURETTAGE/HYSTEROSCOPY WITH MINERVA N/A  06/09/2020   Procedure: DILATATION AND CURETTAGE/HYSTEROSCOPY WITH MINERVA;  Surgeon: Florian Buff, MD;  Location: [facility] ORS;  Service: Gynecology;  Laterality: N/A;   ESOPHAGOGASTRODUODENOSCOPY (EGD) WITH PROPOFOL N/A 12/25/2018   Procedure: ESOPHAGOGASTRODUODENOSCOPY (EGD) WITH PROPOFOL;  Surgeon: Rogene Houston, MD;  Location: [facility] ENDO SUITE;  Service: Endoscopy;  Laterality: N/A;   FLEXIBLE SIGMOIDOSCOPY  10/14/2021   Procedure: FLEXIBLE SIGMOIDOSCOPY;  Surgeon: Harvel Quale, MD;  Location: [facility] ENDO SUITE;  Service: Gastroenterology;;   PATELLA-FEMORAL ARTHROPLASTY Left 10/08/2018   Procedure: PATELLA-FEMORAL ARTHROPLASTY;  Surgeon: Marchia Bond, MD;  Location: [facility] ORS;  Service: Orthopedics;  Laterality: Left;   TUBAL LIGATION     Family History:  Family History  Problem Relation Age of Onset   Gout Paternal Grandfather    Cirrhosis Paternal Grandfather    Hypertension Paternal Grandmother    Aneurysm Paternal Grandmother    Cirrhosis Maternal Grandmother    Cirrhosis Maternal Grandfather    Cancer Father    Cirrhosis Father    Cirrhosis Mother    Breast cancer Sister    Hypertension Sister    Bronchitis Daughter    Bronchitis Daughter    Asthma Son    Bronchitis Son    Migraines Neg Hx    Social History:  Social History   Substance and Sexual Activity  Alcohol Use Not Currently   Comment: started drinking again last night while at a party as of 06/29/22     Social History   Substance and Sexual Activity  Drug Use Yes   Types: Cocaine    Social History   Socioeconomic History   Marital status: Single    Spouse name: Not on file   Number of children: 3   Years of education: 10   Highest education level: 10th grade  Occupational History   Not on file  Tobacco Use   Smoking status: Every Day    Packs/day: 0.25    Years: 10.00    Additional pack years: 0.00    Total pack years: 2.50    Types: Cigarettes    Last attempt to quit: 04/2020    Years  since quitting: 2.2    Passive exposure: Never   Smokeless tobacco: Never   Tobacco comments:    Smokes often, especially when using crack cocaine  Vaping Use   Vaping Use: Never used  Substance and Sexual Activity   Alcohol use: Not Currently    Comment: started drinking again last night while at a party as of 06/29/22   Drug use: Yes    Types: Cocaine   Sexual activity: Yes    Birth control/protection: Surgical    Comment: tubal, ablation  Other Topics Concern   Not on file  Social History Narrative   R handed    Lives with boyfriend   1 Cup of caffeine daily    Social Determinants of Health   Financial Resource Strain: Not on file  Food Insecurity: Food Insecurity Present (06/30/2022)   Hunger Vital Sign    Worried About Running Out of Food in the Last Year: Often true    Ran Out of Food in the Last Year: Often true  Transportation Needs: Unmet Transportation Needs (06/30/2022)   PRAPARE - Hydrologist (Medical): Yes  Lack of Transportation (Non-Medical): Yes  Physical Activity: Not on file  Stress: Not on file  Social Connections: Not on file   Additional Social History:    Current Medications: Current Facility-Administered Medications  Medication Dose Route Frequency Provider Last Rate Last Admin   albuterol (PROVENTIL) (2.5 MG/3ML) 0.083% nebulizer solution 2.5 mg  2.5 mg Nebulization Q6H PRN Lucky Rathke, FNP       alum & mag hydroxide-simeth (MAALOX/MYLANTA) 200-200-20 MG/5ML suspension 30 mL  30 mL Oral Q4H PRN Lucky Rathke, FNP   30 mL at 07/02/22 1435   amLODipine (NORVASC) tablet 5 mg  5 mg Oral Daily Lucky Rathke, FNP   5 mg at 07/09/22 [id number]   cholecalciferol (VITAMIN D3) 25 MCG (1000 UNIT) tablet 1,000 Units  1,000 Units Oral Daily Lucky Rathke, FNP   1,000 Units at 07/09/22 0825   cloNIDine (CATAPRES) tablet 0.1 mg  0.1 mg Oral Q4H PRN Massengill, Ovid Curd, MD       diphenhydrAMINE (BENADRYL) capsule 50 mg  50 mg Oral TID PRN  Lucky Rathke, FNP       Or   diphenhydrAMINE (BENADRYL) injection 50 mg  50 mg Intramuscular TID PRN Lucky Rathke, FNP       DULoxetine (CYMBALTA) DR capsule 60 mg  60 mg Oral Daily Lucky Rathke, FNP   60 mg at 07/09/22 0825   fluticasone furoate-vilanterol (BREO ELLIPTA) 200-25 MCG/ACT 1 puff  1 puff Inhalation Daily Lucky Rathke, FNP   1 puff at 07/09/22 0826   haloperidol (HALDOL) tablet 5 mg  5 mg Oral TID PRN Lucky Rathke, FNP       Or   haloperidol lactate (HALDOL) injection 5 mg  5 mg Intramuscular TID PRN Lucky Rathke, FNP       hydrOXYzine (ATARAX) tablet 100 mg  100 mg Oral Q6H PRN Massengill, Ovid Curd, MD   100 mg at 07/09/22 0026   lidocaine (LIDODERM) 5 % 1 patch  1 patch Transdermal Q24H France Ravens, MD   1 patch at 07/08/22 1225   LORazepam (ATIVAN) tablet 2 mg  2 mg Oral TID PRN Lucky Rathke, FNP       Or   LORazepam (ATIVAN) injection 2 mg  2 mg Intramuscular TID PRN Lucky Rathke, FNP       lurasidone (LATUDA) tablet 60 mg  60 mg Oral Q breakfast Ntuen, Tina C, FNP   60 mg at 07/09/22 0830   magnesium hydroxide (MILK OF MAGNESIA) suspension 30 mL  30 mL Oral Daily PRN Lucky Rathke, FNP   30 mL at 07/02/22 0800   menthol-cetylpyridinium (CEPACOL) lozenge 3 mg  1 lozenge Oral PRN Massengill, Ovid Curd, MD   3 mg at 07/07/22 0802   methocarbamol (ROBAXIN) tablet 500 mg  500 mg Oral Q8H PRN Massengill, Ovid Curd, MD   500 mg at 07/09/22 0026   nicotine (NICODERM CQ - dosed in mg/24 hours) patch 14 mg  14 mg Transdermal Daily Massengill, Ovid Curd, MD   14 mg at 07/09/22 0826   ondansetron (ZOFRAN-ODT) disintegrating tablet 4 mg  4 mg Oral Q8H PRN Lindell Spar I, NP   4 mg at 07/08/22 1609   pantoprazole (PROTONIX) EC tablet 40 mg  40 mg Oral Daily France Ravens, MD   40 mg at 07/09/22 0824   pregabalin (LYRICA) capsule 150 mg  150 mg Oral BID Lucky Rathke, FNP   150 mg at 07/09/22 [phone]  ramelteon (ROZEREM) tablet 8 mg  8 mg Oral QHS Massengill, Ovid Curd, MD   8 mg at 07/09/22 [id number]   Lab  Results:  Results for orders placed or performed during the hospital encounter of 06/30/22 (from the past 24 hour(s))  Lipase, blood     Status: None   Collection Time: 07/08/22  6:49 PM  Result Value Ref Range   Lipase 42 11 - 51 U/L  Comprehensive metabolic panel     Status: Abnormal   Collection Time: 07/08/22  6:49 PM  Result Value Ref Range   Sodium 133 (L) 135 - 145 mmol/L   Potassium 4.7 3.5 - 5.1 mmol/L   Chloride 101 98 - 111 mmol/L   CO2 24 22 - 32 mmol/L   Glucose, Bld 92 70 - 99 mg/dL   BUN 13 6 - 20 mg/dL   Creatinine, Ser 0.77 0.44 - 1.00 mg/dL   Calcium 8.4 (L) 8.9 - 10.3 mg/dL   Total Protein 6.4 (L) 6.5 - 8.1 g/dL   Albumin 3.1 (L) 3.5 - 5.0 g/dL   AST 20 15 - 41 U/L   ALT 17 0 - 44 U/L   Alkaline Phosphatase 67 38 - 126 U/L   Total Bilirubin 0.5 0.3 - 1.2 mg/dL   GFR, Estimated >60 >60 mL/min   Anion gap 8 5 - 15  CBC     Status: Abnormal   Collection Time: 07/08/22  6:49 PM  Result Value Ref Range   WBC 10.0 4.0 - 10.5 K/uL   RBC 4.11 3.87 - 5.11 MIL/uL   Hemoglobin 11.3 (L) 12.0 - 15.0 g/dL   HCT 35.8 (L) 36.0 - 46.0 %   MCV 87.1 80.0 - 100.0 fL   MCH 27.5 26.0 - 34.0 pg   MCHC 31.6 30.0 - 36.0 g/dL   RDW 17.3 (H) 11.5 - 15.5 %   Platelets 562 (H) 150 - 400 K/uL   nRBC 0.0 0.0 - 0.2 %     Blood Alcohol level:  Lab Results  Component Value Date   ETH <10 06/29/2022   ETH <10 [id number]   Metabolic Disorder Labs: Lab Results  Component Value Date   HGBA1C 5.5 01/10/2022   MPG 111.15 01/10/2022   MPG 111.15 10/19/2021   No results found for: "PROLACTIN" Lab Results  Component Value Date   CHOL 168 01/10/2022   TRIG 56 01/10/2022   HDL 57 01/10/2022   CHOLHDL 2.9 01/10/2022   VLDL 11 01/10/2022   LDLCALC 100 (H) 01/10/2022   LDLCALC 84 10/19/2021   Physical Findings:  Musculoskeletal: Strength & Muscle Tone: within normal limits Gait & Station: normal  Psychiatric Specialty Exam:  Presentation  General Appearance:  Appropriate  for Environment; Casual; Fairly Groomed  Eye Contact: Good  Speech: Clear and Coherent  Speech Volume: Normal  Handedness: Right  Mood and Affect  Mood: -- (Improving)  Affect: Congruent  Thought Process  Thought Processes: Coherent; Goal Directed; Linear  Descriptions of Associations:Intact  Orientation:Full (Time, Place and Person)  Thought Content:Logical  History of Schizophrenia/Schizoaffective disorder:No  Duration of Psychotic Symptoms:N/A  Hallucinations:Hallucinations: None Description of Auditory Hallucinations: NA Description of Visual Hallucinations: NA  Ideas of Reference:None  Suicidal Thoughts:Suicidal Thoughts: No SI Active Intent and/or Plan: Without Intent; Without Means to Carry Out; Without Access to Means SI Passive Intent and/or Plan: Without Intent; Without Plan; Without Means to Carry Out; Without Access to Means  Homicidal Thoughts:Homicidal Thoughts: No HI Active Intent and/or Plan: Without Intent; Without Plan;  Without Means to Carry Out; Without Access to Means  Sensorium  Memory: Immediate Good; Recent Good; Remote Good  Judgment: Good  Insight: Fair  Executive Functions  Concentration: Good  Attention Span: Good  Recall: Good  Fund of Knowledge: Good  Language: Good  Psychomotor Activity  Psychomotor Activity:Psychomotor Activity: Normal  Assets  Assets: Communication Skills; Desire for Improvement; Financial Resources/Insurance; Physical Health; Resilience; Social Support  Sleep  Sleep:Sleep: Good Number of Hours of Sleep: 8  Physical Exam: Review of Systems  Constitutional: Negative.   HENT: Negative.    Eyes: Negative.   Respiratory:  Negative for shortness of breath.   Cardiovascular: Negative.  Negative for chest pain.  Gastrointestinal:  Negative for abdominal pain, constipation, diarrhea, heartburn, nausea and vomiting.  Genitourinary: Negative.   Musculoskeletal:  Positive for back pain  (s/p back surgery).  Neurological:  Negative for headaches.  Endo/Heme/Allergies: Negative.   Psychiatric/Behavioral:  Positive for depression (improving) and substance abuse (hx cocaine use disorder.). Negative for hallucinations, memory loss and suicidal ideas. The patient is not nervous/anxious and does not have insomnia.    Blood pressure 107/73, pulse 89, temperature 98 F (36.7 C), temperature source Oral, resp. rate 16, height 4\' 11"  (1.499 m), weight 86.6 kg, last menstrual period 06/07/2020, SpO2 100 %. Body mass index is 38.58 kg/m.  Patient's mood symptoms predominantly correlated with her cocaine use and housing.  She does not exhibit any psychotic nor depressive symptoms outside of it.  LCSW to provide substance use housing resources to aid with patient's homelessness which seems to be a primary source of her distress and mood symptoms.   Daily contact with patient to assess and evaluate symptoms and progress in treatment and Medication management.    Continue inpatient hospitalization.  Will continue today 07/09/2022 plan as below except where it is noted.   PLAN Safety and Monitoring: Voluntary admission to inpatient psychiatric unit for safety, stabilization and treatment Daily contact with patient to assess and evaluate symptoms and progress in treatment Patient's case to be discussed in multi-disciplinary team meeting Observation Level : q15 minute checks Vital signs: q12 hours Precautions: suicide, elopement, and assault  2. Psychiatric Problems MDD-Severe, recurrent episode without psychosis Stimulant use disorder-cocaine Nicotine use disorder Housing Instability.   --Continue Cymbalta DR Capsule 60 mg p.o. daily for depression --Continue Latuda tablets 60 mg daily with breakfast for bipolar d/o. --Continue hydroxyzine 100 mg p.o. every 6 hours as needed for anxiety --Continue Nicotine patch for NRT.  --Continue Rozerem 8 mg po Q hs for insomnia.    Agitation  protocols: Cont as recommended;  -Benadryl 50 mg po or IM tid prn. -Haldol 5 mg po or IM tid prn.  -Lorazepam 2 mg po or IM tid prn.  Medical Problems Hypertension Likely also secondary to cocaine use 121/70 today -Amlodipine 5 mg daily  Asthma -Recent exacerbation and placed on 5 day course of prednisone on 06/27/22 -Fluticasone furoate-vilanterol 200-25 mcg/ACT 1 puff daily  -Completed 5 day course of prednisone today (07/02/22).   Back Pain -Lyrica 150 mg twice daily -Lidocaine patch trans-dermally for pain.  -Continue Robaxin 500 mg po tid prn for muscle spasms.   GERD.  Continue Protonix 40 mg po Q am for GERD.  PRNs -Tylenol 650 mg for mild pain -Maalox/Mylanta 30 mL for indigestion -Hydroxyzine 25 mg tid for anxiety -Milk of Magnesia 30 mL for constipation.  -Continue clonidine tablet 0.1 mg po Q 4 hours as needed if systolic blood pressure over 180 or  if diastolic blood pressure over 100    4. Discharge Planning: Social work and case management to assist with discharge planning and identification of hospital follow-up needs prior to discharge Estimated LOS: 3 days Discharge Concerns: Need to establish a safety plan; Medication compliance and effectiveness Discharge Goals: Return home with outpatient referrals for mental health follow-up including medication management/psychotherapy  Lindell Spar, NP, pmhnp, fnp-bc. 07/09/2022, 8:56 AM Patient ID: Kathryn [name], female   DOB: 1970/11/07, 52 y.o.   MRN: [id number] Patient ID: Kathryn [name], female   DOB: 04/03/1971, 52 y.o.   MRN: [id number] Patient ID: Kathryn [name], female   DOB: [birth year], 52 y.o.   MRN: [id number] Patient ID: Kathryn [name], female   DOB: November 20, 1970, 52 y.o.   MRN: [id number] Patient ID: Kathryn [name], female   DOB: 1970-07-27, 52 y.o.   MRN: [id number] Patient ID: Kathryn [name], female   DOB: February 10, 1971, 52 y.o.   MRN: [id number]

## 2022-07-09 NOTE — Progress Notes (Signed)
   07/09/22 2312  Psych Admission Type (Psych Patients Only)  Admission Status Voluntary  Psychosocial Assessment  Patient Complaints Anxiety  Eye Contact Fair  Facial Expression Animated  Affect Appropriate to circumstance  Speech Logical/coherent  Interaction Assertive  Motor Activity Slow (Pt ambulates with walker)  Appearance/Hygiene Unremarkable  Behavior Characteristics Appropriate to situation  Mood Pleasant;Anxious  Thought Process  Coherency WDL  Content WDL  Delusions None reported or observed  Perception WDL  Hallucination None reported or observed  Judgment Poor  Confusion None  Danger to Self  Current suicidal ideation? Denies  Self-Injurious Behavior No self-injurious ideation or behavior indicators observed or expressed   Agreement Not to Harm Self Yes  Description of Agreement verbal  Danger to Others  Danger to Others None reported or observed

## 2022-07-09 NOTE — Progress Notes (Signed)
   07/09/22 0036  Psych Admission Type (Psych Patients Only)  Admission Status Voluntary  Psychosocial Assessment  Patient Complaints Depression  Eye Contact Fair  Facial Expression Animated  Affect Appropriate to circumstance  Speech Logical/coherent  Interaction Assertive  Motor Activity Slow (ambulates with walker)  Appearance/Hygiene Unremarkable  Behavior Characteristics Appropriate to situation  Mood Depressed;Pleasant  Thought Process  Coherency WDL  Content WDL  Delusions None reported or observed  Perception WDL  Hallucination None reported or observed  Judgment Poor  Confusion None  Danger to Self  Current suicidal ideation? Denies  Self-Injurious Behavior No self-injurious ideation or behavior indicators observed or expressed   Agreement Not to Harm Self Yes  Description of Agreement verbal  Danger to Others  Danger to Others None reported or observed

## 2022-07-09 NOTE — Progress Notes (Signed)
Pt returned from ED and requested meal tray, states she did not eat over there.  Pt given night medications and resting in room eating meal, no distress or discomfort noted.

## 2022-07-09 NOTE — Progress Notes (Addendum)
D:  Patient denied SI and HI, contracts for safety.  Denied A/V hallucinations. A:  Medications administered per MD orders.  Emotional support and encouragement given patient. R:  Safety maintained with 15 minute checks.  Patient stated she came back to [facility] after going to [facility] for evaluation

## 2022-07-09 NOTE — Progress Notes (Signed)
   07/09/22 1605  Important Message  Medicare important message given? Yes   IM was signed and turned in to be scanned in to chart.  Selmer Dominion, LCSW 07/09/2022, 4:06 PM

## 2022-07-09 NOTE — Plan of Care (Signed)
Nurse discussed coping skills with patient.  

## 2022-07-09 NOTE — [facility] Group Notes (Signed)
[facility] Group Notes:  (Nursing/MHT/Case Management/Adjunct)  Date:  07/09/2022  Time:  9:03 PM  Type of Therapy:  Group Therapy  Participation Level:  Did Not Attend  Participation Quality:   na  Affect:   na  Cognitive:   na  Insight:  None  Engagement in Group:   na  Modes of Intervention:   na  Summary of Progress/Problems: didn't;t attend.  Orvan Falconer 07/09/2022, 9:03 PM

## 2022-07-10 DIAGNOSIS — F250 Schizoaffective disorder, bipolar type: Principal | ICD-10-CM

## 2022-07-10 DIAGNOSIS — F200 Paranoid schizophrenia: Secondary | ICD-10-CM | POA: Diagnosis not present

## 2022-07-10 MED ORDER — HYDROXYZINE HCL 50 MG PO TABS: 100.0000 mg | ORAL_TABLET | Freq: Four times a day (QID) | ORAL | 0 refills | Status: DC | PRN

## 2022-07-10 MED ORDER — AMLODIPINE BESYLATE 5 MG PO TABS: 5.0000 mg | ORAL_TABLET | Freq: Every day | ORAL | 0 refills | Status: DC

## 2022-07-10 MED ORDER — LIDOCAINE 5 % EX PTCH: 1.0000 | MEDICATED_PATCH | CUTANEOUS | 0 refills | Status: DC

## 2022-07-10 MED ORDER — RAMELTEON 8 MG PO TABS: 8.0000 mg | ORAL_TABLET | Freq: Every day | ORAL | 0 refills | Status: DC

## 2022-07-10 MED ORDER — DULOXETINE HCL 60 MG PO CPEP: 60.0000 mg | ORAL_CAPSULE | Freq: Every day | ORAL | 0 refills | Status: DC

## 2022-07-10 MED ORDER — LURASIDONE HCL 20 MG PO TABS: 60.0000 mg | ORAL_TABLET | Freq: Every day | ORAL | 0 refills | Status: DC

## 2022-07-10 MED ORDER — NICOTINE 14 MG/24HR TD PT24: 14.0000 mg | MEDICATED_PATCH | Freq: Every day | TRANSDERMAL | 0 refills | Status: DC

## 2022-07-10 NOTE — Progress Notes (Signed)
Patient presents with pleasant mood, affect congruent. Kathryn [name] reports she is doing well, is up smiling and reports she is planned for discharge today. She states '' I feel ready to go . My family can be here around lunch time''  Patient denies any SI HI or AV Hallucinations. She has been visible in the milieu and compliant with am medications. No signs of acute decompensation.  Discharge AVS reviewed with patient. Crisis services reviewed at length. Pt verbalized understanding of follow up plan. Pt requested to wait in lobby for spouse, meal tray provided. Pt belongings returned. Pt discharged to care of family
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
## 2022-07-10 NOTE — Group Note (Signed)
Date:  07/10/2022 Time:  10:51 AM  Group Topic/Focus:  Orientation:   The focus of this group is to educate the patient on the purpose and policies of crisis stabilization and provide a format to answer questions about their admission.  The group details unit policies and expectations of patients while admitted.    Participation Level:  Active  Participation Quality:  Attentive  Affect:  Appropriate  Cognitive:  Appropriate  Insight: Appropriate  Engagement in Group:  Engaged  Modes of Intervention:  Discussion  Additional Comments:     [name] 07/10/2022, 10:51 AM

## 2022-07-10 NOTE — Group Note (Unsigned)
Date:  07/10/2022 Time:  10:52 AM  Group Topic/Focus:  Orientation:   The focus of this group is to educate the patient on the purpose and policies of crisis stabilization and provide a format to answer questions about their admission.  The group details unit policies and expectations of patients while admitted.     Participation Level:  {BHH PARTICIPATION WO:[id number]  Participation Quality:  {BHH PARTICIPATION QUALITY:22265}  Affect:  {BHH AFFECT:22266}  Cognitive:  {BHH COGNITIVE:22267}  Insight: {BHH Insight2:20797}  Engagement in Group:  {B[facility] ENGAGEMENT IN BP:8198245  Modes of Intervention:  {BHH MODES OF INTERVENTION:22269}  Additional Comments:  ***  [name] 07/10/2022, 10:52 AM

## 2022-07-10 NOTE — Progress Notes (Signed)
  [facility] Adult Case Management Discharge Plan :  Will you be returning to the same living situation after discharge:  Yes,  [place] (Daughter)  At discharge, do you have transportation home?: No.Insured Do you have the ability to pay for your medications:   Release of information consent forms completed and in the chart;  Patient's signature needed at discharge.  Patient to Follow up at:  [place] Follow up on 07/17/2022.   Why: You have an appointment for therapy services on 07/17/22 at 5:00 pm.  This will be a Virtual appointment.  * YOU MUST FILL OUT YOUR PAPERWORK AND SUBMIT TO THE PROVIDER BY 07/10/22 TO KEEP YOUR APPOINTMENT. Contact information: [address] [place], [place] [address] [phone]         [facility] Follow up on 07/28/2022.   Why: You have an appointment for medication management services on 07/28/22 at 4:00 pm. This will be a Virtual appointment. Contact information: [place] [address] [phone]                 Next level of care provider has access to [place] and Suicide Prevention discussed: Yes,  Daughter     Has patient been referred to the Quitline?: Patient refused referral  Patient has been referred for addiction treatment: Yes Patient to continue working towards treatment goals after discharge. Patient no longer meets criteria for inpatient criteria per attending physician. Continue taking medications as prescribed, nursing to provide instructions at discharge. Follow up with all scheduled appointments.   Jaselyn Nahm S Lanny Donoso, LCSW 07/10/2022, 9:49 AM

## 2022-07-10 NOTE — Discharge Summary (Signed)
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  Patient [name] for worsening depression resulting in suicidal ideations in the context of cocaine use disorder.   Principal Problem: Schizoaffective disorder, bipolar type Kathryn [name]) Discharge Diagnoses: Principal Problem:   Schizoaffective disorder, bipolar type ([place]) Active Problems:   GAD (generalized anxiety disorder)   Cocaine use disorder ([place])   Homelessness  Past Psychiatric History: Previous Psych Diagnoses: MDD, cocaine-induced mood disorder, polysubstance abuse, schizoaffective disorder bipolar type Prior inpatient treatment: She was hospitalized at [name] from 10/17/21 to 12/07/21; & 01/09/19 23-10 /04/2021. She has also been admitted to [facility] last year; Current/prior outpatient treatment: She receives her current outpatient psychiatric care from [facility] in [place].  Psychotherapy hx: unknown History of suicide: previous attempt September 2020 by consuming bleach and vinegar, Yes, 2019 patient cut wrist, 2023 patient drank Clorox and vinegar, 2024 patient drank Clorox. History of homicide: none Psychiatric medication history: Duloxetine 30 mg, Benztropine 0.5 mg, citalopram 20 mg,  Psychiatric medication compliance history: poor compliance Prior inpatient treatment: Yes, see above Current/prior outpatient treatment: Yes Prior rehab hx: Patient denies Psychiatric  medication history: Yes Neuromodulation history: Denies Current Psychiatrist: Dr.  Hoyle Barr at [facility] Current therapist: Patient denies  Past Medical History:  Past Medical History:  Diagnosis Date   Anxiety    Arthritis    Asthma    Bipolar disorder ([place])    Chronic back pain    COPD (chronic obstructive pulmonary disease) (HCC)    Chronic bronchitis   Depression    Dyspnea    GERD (gastroesophageal reflux disease)    Hypertension    Migraine    Neuropathy    Osteoarthritis of left knee, 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   Single subsegmental pulmonary embolism without acute cor pulmonale ([place]) 06/20/2021   Sleep apnea 04/2020   GETTING A cpap   Suicidal ideation 12/24/2018    Past Surgical History:  Procedure Laterality Date   BACK SURGERY     DILATATION AND CURETTAGE/HYSTEROSCOPY WITH MINERVA N/A 06/09/2020   Procedure: DILATATION AND CURETTAGE/HYSTEROSCOPY WITH MINERVA;  Surgeon: Florian Buff, MD;  Location: [facility];  Service: Gynecology;  Laterality: N/A;   ESOPHAGOGASTRODUODENOSCOPY (EGD) WITH PROPOFOL N/A 12/25/2018   Procedure: ESOPHAGOGASTRODUODENOSCOPY (EGD) WITH PROPOFOL;  Surgeon: Rogene Houston, MD;  Location: [facility];  Service: Endoscopy;  Laterality: N/A;   FLEXIBLE SIGMOIDOSCOPY  10/14/2021   Procedure: FLEXIBLE SIGMOIDOSCOPY;  Surgeon: Harvel Quale, MD;  Location: [facility];  Service: Gastroenterology;;   PATELLA-FEMORAL ARTHROPLASTY Left 10/08/2018   Procedure: PATELLA-FEMORAL ARTHROPLASTY;  Surgeon: Marchia Bond, MD;  Location: [facility];  Service: Orthopedics;  Laterality: Left;   TUBAL LIGATION     Family History:  Family History  Problem Relation Age of Onset   Gout Paternal Grandfather    Cirrhosis Paternal Grandfather    Hypertension Paternal Grandmother    Aneurysm Paternal Grandmother    Cirrhosis Maternal Grandmother    Cirrhosis Maternal Grandfather    Cancer  Father    Cirrhosis Father    Cirrhosis Mother    Breast cancer  Sister    Hypertension Sister    Bronchitis Daughter    Bronchitis Daughter    Asthma Son    Bronchitis Son    Migraines Neg Hx    Family Psychiatric  History: Psych: Patient denies Psych Rx: Denies SA/HA: Patient denies  Substance use family hx: Denies  Social History:  Social History   Substance and Sexual Activity  Alcohol Use Not Currently   Comment: started drinking again last night while at a party as of 06/29/22     Social History   Substance and Sexual Activity  Drug Use Yes   Types: Cocaine    Social History   Socioeconomic History   Marital status: Single    Spouse name: Not on file   Number of children: 3   Years of education: 10   Highest education level: 10th grade  Occupational History   Not on file  Tobacco Use   Smoking status: Every Day    Packs/day: 0.25    Years: 10.00    Additional pack years: 0.00    Total pack years: 2.50    Types: Cigarettes    Last attempt to quit: 04/2020    Years since quitting: 2.2    Passive exposure: Never   Smokeless tobacco: Never   Tobacco comments:    Smokes often, especially when using crack cocaine  Vaping Use   Vaping Use: Never used  Substance and Sexual Activity   Alcohol use: Not Currently    Comment: started drinking again last night while at a party as of 06/29/22   Drug use: Yes    Types: Cocaine   Sexual activity: Yes    Birth control/protection: Surgical    Comment: tubal, ablation  Other Topics Concern   Not on file  Social History Narrative   R handed    Lives with boyfriend   1 Cup of caffeine daily    Social Determinants of Health   Financial Resource Strain: Not on file  Food Insecurity: Food Insecurity Present (06/30/2022)   Hunger Vital Sign    Worried About Running Out of Food in the Last Year: Often true    Ran Out of Food in the Last Year: Often true  Transportation Needs: Unmet Transportation Needs (06/30/2022)   PRAPARE - Hydrologist (Medical):  Yes    Lack of Transportation (Non-Medical): Yes  Physical Activity: Not on file  Stress: Not on file  Social Connections: Not on file    [name] Course:  [name] COURSE:  During the patient's hospitalization, patient had extensive initial psychiatric evaluation, and follow-up psychiatric evaluations every day.  Psychiatric diagnoses provided upon initial assessment:  Schizophrenia  Patient's psychiatric medications were adjusted on admission:  Cymbalta DR capsule 60 mg p.o. daily for depression Latuda tablet 40 mg p.o. daily with breakfast for bipolar disorder Hydroxyzine 100 mg p.o. every 6 hours as needed for anxiety  During the hospitalization, other adjustments were made to the patient's psychiatric medication regimen:  None  Patient's care was discussed during the interdisciplinary team meeting every day during the hospitalization.  The patient denies having side effects to prescribed psychiatric medication.  Gradually, patient started adjusting to milieu. The patient was evaluated each day by a clinical provider to ascertain response to treatment. Improvement was noted by the patient's report of decreasing symptoms, improved sleep and appetite, affect, medication tolerance, behavior,  and participation in unit programming.  Patient was asked each day to complete a self inventory noting mood, mental status, pain, new symptoms, anxiety and concerns.    Symptoms were reported as significantly decreased or resolved completely by discharge.   On day of discharge, the patient reports that their mood is stable. The patient denied having suicidal thoughts for more than 48 hours prior to discharge.  Patient denies having homicidal thoughts.  Patient denies having auditory hallucinations.  Patient denies any visual hallucinations or other symptoms of psychosis. The patient was motivated to continue taking medication with a goal of continued improvement in mental health.   The patient  reports their target psychiatric symptoms of depression responded well to the psychiatric medications, and the patient reports overall benefit other psychiatric hospitalization. Supportive psychotherapy was provided to the patient. The patient also participated in regular group therapy while hospitalized. Coping skills, problem solving as well as relaxation therapies were also part of the unit programming.  Labs were reviewed with the patient, and abnormal results were discussed with the patient.  The patient is able to verbalize their individual safety plan to this provider.  # It is recommended to the patient to continue psychiatric medications as prescribed, after discharge from the [name].    # It is recommended to the patient to follow up with your outpatient psychiatric provider and PCP.  # It was discussed with the patient, the impact of alcohol, drugs, tobacco have been there overall psychiatric and medical wellbeing, and total abstinence from substance use was recommended the patient.ed.  # Prescriptions provided or sent directly to preferred pharmacy at discharge. Patient agreeable to plan. Given opportunity to ask questions. Appears to feel comfortable with discharge.    # In the event of worsening symptoms, the patient is instructed to call the crisis hotline, 911 and or go to the nearest ED for appropriate evaluation and treatment of symptoms. To follow-up with primary care provider for other medical issues, concerns and or health care needs  # Patient was discharged to home with daughter, with a plan to follow up as noted below.  Physical Findings: AIMS:  , ,  ,  ,    CIWA:    COWS:     Musculoskeletal: Strength & Muscle Tone: within normal limits Gait & Station: normal Patient leans: N/A  Psychiatric Specialty Exam:  Presentation  General Appearance:  Appropriate for Environment; Casual; Fairly Groomed  Eye Contact: Good  Speech: Normal Rate; Clear and  Coherent  Speech Volume: Normal  Handedness: Right  Mood and Affect  Mood: Euthymic; Anxious  Affect: Appropriate; Congruent; Full Range  Thought Process  Thought Processes: Linear  Descriptions of Associations:Intact  Orientation:Full (Time, Place and Person)  Thought Content:Logical  History of Schizophrenia/Schizoaffective disorder:No  Duration of Psychotic Symptoms:Less than six months  Hallucinations:Hallucinations: None Description of Auditory Hallucinations: none occuring for 3-4 days  Ideas of Reference:None  Suicidal Thoughts:Suicidal Thoughts: No  Homicidal Thoughts:Homicidal Thoughts: No  Sensorium  Memory: Immediate Good; Recent Good; Remote Good  Judgment: Fair  Insight: Fair  Community education officer  Concentration: Fair  Attention Span: Fair  Recall: Good  Fund of Knowledge: Good  Language: Good  Psychomotor Activity  Psychomotor Activity: Psychomotor Activity: Normal  Assets  Assets: Communication Skills; Desire for Improvement; Financial Resources/Insurance; Physical Health; Resilience; Social Support  Sleep  Sleep: Sleep: Fair  Physical Exam: Physical Exam Vitals and nursing note reviewed.  HENT:     Head: Normocephalic.     Mouth/Throat:  Mouth: Mucous membranes are moist.  Cardiovascular:     Rate and Rhythm: Normal rate.     Heart sounds: Normal heart sounds.  Pulmonary:     Effort: Pulmonary effort is normal.  Neurological:     General: No focal deficit present.     Mental Status: She is alert and oriented to person, place, and time.  Psychiatric:        Mood and Affect: Mood normal.        Behavior: Behavior normal.    Review of Systems  Constitutional: Negative.   HENT: Negative.    Respiratory: Negative.    Cardiovascular: Negative.   Gastrointestinal: Negative.   Genitourinary: Negative.   Musculoskeletal:  Positive for back pain (Chronic back pain.  Improved with as needed medication  since).  Skin: Negative.   Neurological: Negative.   Endo/Heme/Allergies: Negative.        See allergy list  Psychiatric/Behavioral:  Positive for depression (Stable with medication) and substance abuse. The patient is nervous/anxious (Improved with medication) and has insomnia (Improved with medication).    Blood pressure 115/75, pulse 89, temperature 98.2 F (36.8 C), temperature source Oral, resp. rate 20, height 4\' 11"  (1.499 m), weight 86.6 kg, last menstrual period 06/07/2020, SpO2 99 %. Body mass index is 38.58 kg/m.   Social History   Tobacco Use  Smoking Status Every Day   Packs/day: 0.25   Years: 10.00   Additional pack years: 0.00   Total pack years: 2.50   Types: Cigarettes   Last attempt to quit: 04/2020   Years since quitting: 2.2   Passive exposure: Never  Smokeless Tobacco Never  Tobacco Comments   Smokes often, especially when using crack cocaine   Tobacco Cessation:  A prescription for an FDA-approved tobacco cessation medication provided at discharge   Blood Alcohol level:  Lab Results  Component Value Date   ETH <10 06/29/2022   ETH <10 [id number]    Metabolic Disorder Labs:  Lab Results  Component Value Date   HGBA1C 5.5 01/10/2022   MPG 111.15 01/10/2022   MPG 111.15 10/19/2021   No results found for: "PROLACTIN" Lab Results  Component Value Date   CHOL 168 01/10/2022   TRIG 56 01/10/2022   HDL 57 01/10/2022   CHOLHDL 2.9 01/10/2022   VLDL 11 01/10/2022   LDLCALC 100 (H) 01/10/2022   LDLCALC 84 10/19/2021    See Psychiatric Specialty Exam and Suicide Risk Assessment completed by Attending Physician prior to discharge.  Discharge destination:  Home  Is patient on multiple antipsychotic therapies at discharge:  No   Has Patient had three or more failed trials of antipsychotic monotherapy by history:  No  Recommended Plan for Multiple Antipsychotic Therapies: NA  Discharge Instructions     Diet - low sodium heart healthy    Complete by: As directed    Increase activity slowly   Complete by: As directed       Allergies as of 07/10/2022       Reactions   Fish Allergy Anaphylaxis, Shortness Of Breath, Swelling   Flexeril [cyclobenzaprine Hcl] Shortness Of Breath   Ibuprofen Anaphylaxis, Hives, Other (See Comments)   Shellfish Allergy Anaphylaxis   Tylenol [acetaminophen] Anaphylaxis   Ace Inhibitors Cough   Peanut Allergen Powder-dnfp    Tramadol Nausea And Vomiting   Upset stomach   Trazodone And Nefazodone Hives        Medication List     STOP taking these medications    amoxicillin-clavulanate  875-125 MG tablet Commonly known as: AUGMENTIN   anagrelide 1 MG capsule Commonly known as: AGRYLIN   benzonatate 200 MG capsule Commonly known as: TESSALON   citalopram 20 MG tablet Commonly known as: CELEXA   furosemide 20 MG tablet Commonly known as: LASIX   hydrOXYzine 25 MG capsule Commonly known as: VISTARIL   lubiprostone 24 MCG capsule Commonly known as: Amitiza   mometasone-formoterol 100-5 MCG/ACT Aero Commonly known as: DULERA   omeprazole 20 MG capsule Commonly known as: PRILOSEC   potassium chloride SA 20 MEQ tablet Commonly known as: KLOR-CON M   predniSONE 50 MG tablet Commonly known as: DELTASONE   risperiDONE 0.5 MG tablet Commonly known as: RISPERDAL   Vitamin D (Ergocalciferol) 1.25 MG (50000 UNIT) Caps capsule Commonly known as: DRISDOL       TAKE these medications      Indication  albuterol 108 (90 Base) MCG/ACT inhaler Commonly known as: VENTOLIN HFA Inhale 2 puffs into the lungs every 4 (four) hours as needed for wheezing or shortness of breath.  Indication: Asthma   albuterol (2.5 MG/3ML) 0.083% nebulizer solution Commonly known as: PROVENTIL Take 3 mLs (2.5 mg total) by nebulization every 6 (six) hours as needed for wheezing or shortness of breath.  Indication: Spasm of Lung Air Passages   amLODipine 5 MG tablet Commonly known as:  NORVASC Take 1 tablet (5 mg total) by mouth daily.  Indication: High Blood Pressure Disorder   Breo Ellipta 200-25 MCG/ACT Aepb Generic drug: fluticasone furoate-vilanterol INHALE 1 PUFF EVERY DAY  Indication: Asthma   cholecalciferol 25 MCG (1000 UNIT) tablet Commonly known as: VITAMIN D3 Take 1,000 Units by mouth daily.  Indication: Vitamin D Deficiency   DULoxetine 60 MG capsule Commonly known as: CYMBALTA Take 1 capsule (60 mg total) by mouth daily. Start taking on: July 11, 2022  Indication: Major Depressive Disorder   hydrOXYzine 50 MG tablet Commonly known as: ATARAX Take 2 tablets (100 mg total) by mouth every 6 (six) hours as needed for anxiety.  Indication: Feeling Anxious   lidocaine 5 % Commonly known as: LIDODERM Place 1 patch onto the skin daily. Remove & Discard patch within 12 hours or as directed by MD  Indication: pain   lurasidone 20 MG Tabs tablet Commonly known as: LATUDA Take 3 tablets (60 mg total) by mouth daily with breakfast. Start taking on: July 11, 2022 What changed:  medication strength how much to take  Indication: Depression with Symptoms of Abnormally Elevated Mood   nicotine 14 mg/24hr patch Commonly known as: NICODERM CQ - dosed in mg/24 hours Place 1 patch (14 mg total) onto the skin daily. Start taking on: July 11, 2022  Indication: Nicotine Addiction   pantoprazole 40 MG tablet Commonly known as: PROTONIX Take 1 tablet (40 mg total) by mouth daily.  Indication: Heartburn   pregabalin 150 MG capsule Commonly known as: LYRICA Take 1 capsule (150 mg total) by mouth 2 (two) times daily.  Indication: Neuropathic Pain   ramelteon 8 MG tablet Commonly known as: ROZEREM Take 1 tablet (8 mg total) by mouth at bedtime.  Indication: [place] Follow up on 07/17/2022.   Why: You have an appointment for therapy services on 07/17/22 at 5:00 pm.   This will be a Virtual appointment.  * YOU MUST FILL OUT YOUR PAPERWORK AND SUBMIT TO THE PROVIDER BY 07/10/22 TO KEEP YOUR APPOINTMENT.  Contact information: [address] [place], [place] [address] [phone]         [facility] Follow up on 07/28/2022.   Why: You have an appointment for medication management services on 07/28/22 at 4:00 pm. This will be a Virtual appointment. Contact information: [place] [address] [phone]                 Follow-up recommendations:   Comments:  Discharge Recommendations:  The patient is being discharged with her family. Patient is to take her discharge medications as ordered.  See follow up above. We recommend that she participates in individual therapy to target uncontrollable agitation and substance abuse.  We recommend that she participates in family therapy to target the conflict with her family, to improve communication skills and conflict resolution skills.  Patient/Family is to initiate/implement a contingency based behavioral model to address patient's behavior. We recommend that she gets AIMS scale, height, weight, blood pressure, fasting lipid panel, fasting blood sugar in three months from discharge if he's on atypical antipsychotics.  Patient will benefit from monitoring of recurrent suicidal ideation since patient is on antidepressant medication. The patient should abstain from all illicit substances and alcohol.  If the patient's symptoms worsen or do not continue to improve or if the patient becomes actively suicidal or homicidal then it is recommended that the patient return to the closest [name] emergency room or call 911 for further evaluation and treatment. National Suicide Prevention Lifeline 1800-SUICIDE or [phone]) [phone]. Please follow up with your primary medical doctor for all other medical needs.  The patient has been educated on the possible side effects  to medications and he/his guardian is to contact a medical professional and inform outpatient provider of any new side effects of medication. She is to take regular diet and activity as tolerated.  Will benefit from moderate daily exercise. Patient/Family was educated about removing/locking any firearms, medications or dangerous products from the home.   Activity:  As tolerated Diet:  Regular no added salt    Signed: Laretta Bolster, FNP 07/10/2022, 11:28 AM

## 2022-07-11 NOTE — [facility] Group Notes (Signed)
Spiritual care group on grief and loss facilitated by Chaplain Janne Napoleon, [facility] and Lysle Morales, counseling intern.  Group Goal: Support / Education around grief and loss  Members engage in facilitated group support and psycho-social education.  Group Description:  Following introductions and group rules, group members engaged in facilitated group dialogue and support around topic of loss, with particular support around experiences of loss in their lives. Group Identified types of loss (relationships / self / things) and identified patterns, circumstances, and changes that precipitate losses. Reflected on thoughts / feelings around loss, normalized grief responses, and recognized variety in grief experience. Group encouraged individual reflection on safe space and on the coping skills that they are already utilizing.  Group drew on Adlerian / Rogerian and narrative framework  Patient Progress: Char attended group until called out for discharge preparations.  She participated and engaged in group activities and conversation.  [address], [place] Pager, [phone]

## 2022-07-26 ENCOUNTER — Emergency Department (HOSPITAL_COMMUNITY): Payer: Medicare HMO

## 2022-07-26 ENCOUNTER — Encounter (HOSPITAL_COMMUNITY): Payer: Self-pay | Admitting: Emergency Medicine

## 2022-07-26 ENCOUNTER — Emergency Department (HOSPITAL_COMMUNITY)
Admission: EM | Admit: 2022-07-26 | Discharge: 2022-07-26 | Disposition: A | Discharge status: Home / Self Care | Payer: Medicare HMO | Attending: Emergency Medicine | Admitting: Emergency Medicine

## 2022-07-26 ENCOUNTER — Other Ambulatory Visit: Payer: Self-pay

## 2022-07-26 DIAGNOSIS — R300 Dysuria: Secondary | ICD-10-CM | POA: Diagnosis not present

## 2022-07-26 DIAGNOSIS — N3289 Other specified disorders of bladder: Secondary | ICD-10-CM | POA: Diagnosis not present

## 2022-07-26 DIAGNOSIS — M549 Dorsalgia, unspecified: Secondary | ICD-10-CM | POA: Diagnosis not present

## 2022-07-26 DIAGNOSIS — R202 Paresthesia of skin: Secondary | ICD-10-CM | POA: Diagnosis not present

## 2022-07-26 DIAGNOSIS — I700 Atherosclerosis of aorta: Secondary | ICD-10-CM | POA: Insufficient documentation

## 2022-07-26 DIAGNOSIS — N390 Urinary tract infection, site not specified: Secondary | ICD-10-CM | POA: Diagnosis not present

## 2022-07-26 DIAGNOSIS — D259 Leiomyoma of uterus, unspecified: Secondary | ICD-10-CM | POA: Insufficient documentation

## 2022-07-26 DIAGNOSIS — W19XXXA Unspecified fall, initial encounter: Secondary | ICD-10-CM | POA: Diagnosis not present

## 2022-07-26 DIAGNOSIS — M545 Low back pain, unspecified: Secondary | ICD-10-CM | POA: Diagnosis not present

## 2022-07-26 LAB — COMPREHENSIVE METABOLIC PANEL
ALT: 17 U/L (ref 0–44)
AST: 18 U/L (ref 15–41)
Albumin: 3.6 g/dL (ref 3.5–5.0)
Alkaline Phosphatase: 74 U/L (ref 38–126)
Anion gap: 7 (ref 5–15)
BUN: 16 mg/dL (ref 6–20)
CO2: 25 mmol/L (ref 22–32)
Calcium: 9.1 mg/dL (ref 8.9–10.3)
Chloride: 105 mmol/L (ref 98–111)
Creatinine, Ser: 0.92 mg/dL (ref 0.44–1.00)
GFR, Estimated: >60 mL/min (ref >60)
Glucose, Bld: 104 mg/dL — ABNORMAL HIGH (ref 70–99)
Potassium: 3.4 mmol/L — ABNORMAL LOW (ref 3.5–5.1)
Sodium: 137 mmol/L (ref 135–145)
Total Bilirubin: 0.3 mg/dL (ref 0.3–1.2)
Total Protein: 7.1 g/dL (ref 6.5–8.1)

## 2022-07-26 LAB — CBC WITH DIFFERENTIAL/PLATELET
Abs Immature Granulocytes: 0.06 10*3/uL (ref 0.00–0.07)
Basophils Absolute: 0 10*3/uL (ref 0.0–0.1)
Basophils Relative: 0 %
Eosinophils Absolute: 0.1 10*3/uL (ref 0.0–0.5)
Eosinophils Relative: 1 %
HCT: 35.1 % — ABNORMAL LOW (ref 36.0–46.0)
Hemoglobin: 11.5 g/dL — ABNORMAL LOW (ref 12.0–15.0)
Immature Granulocytes: 1 %
Lymphocytes Relative: 26 %
Lymphs Abs: 3 10*3/uL (ref 0.7–4.0)
MCH: 28.3 pg (ref 26.0–34.0)
MCHC: 32.8 g/dL (ref 30.0–36.0)
MCV: 86.2 fL (ref 80.0–100.0)
Monocytes Absolute: 0.8 10*3/uL (ref 0.1–1.0)
Monocytes Relative: 7 %
Neutro Abs: 7.5 10*3/uL (ref 1.7–7.7)
Neutrophils Relative %: 65 %
Platelets: 729 10*3/uL — ABNORMAL HIGH (ref 150–400)
RBC: 4.07 MIL/uL (ref 3.87–5.11)
RDW: 17.1 % — ABNORMAL HIGH (ref 11.5–15.5)
WBC: 11.5 10*3/uL — ABNORMAL HIGH (ref 4.0–10.5)
nRBC: 0 % (ref 0.0–0.2)

## 2022-07-26 LAB — URINALYSIS, ROUTINE W REFLEX MICROSCOPIC
Bilirubin Urine: NEGATIVE
Glucose, UA: NEGATIVE mg/dL
Hgb urine dipstick: NEGATIVE
Ketones, ur: 5 mg/dL — AB
Nitrite: NEGATIVE
Protein, ur: 30 mg/dL — AB
Specific Gravity, Urine: 1.032 — ABNORMAL HIGH (ref 1.005–1.030)
pH: 5 (ref 5.0–8.0)

## 2022-07-26 MED ORDER — CEPHALEXIN 500 MG PO CAPS: 500.0000 mg | ORAL_CAPSULE | Freq: Two times a day (BID) | ORAL | 0 refills | Status: AC

## 2022-07-26 MED ORDER — SODIUM CHLORIDE 0.9 % IV SOLN
1.0000 g | Freq: Once | INTRAVENOUS | Status: AC
  Administered 2022-07-26: 1 g via INTRAVENOUS
  Filled 2022-07-26: qty 10

## 2022-07-26 MED ORDER — KETAMINE HCL 50 MG/5ML IJ SOSY
0.3000 mg/kg | PREFILLED_SYRINGE | Freq: Once | INTRAMUSCULAR | Status: AC
  Administered 2022-07-26: 22 mg via INTRAVENOUS
  Filled 2022-07-26: qty 5

## 2022-07-26 NOTE — ED Provider Notes (Signed)
[place] EMERGENCY DEPARTMENT AT [facility] Provider Note   CSN: [id number] Arrival date & time: 07/26/22  1594     History  Chief Complaint  Patient presents with   Back Pain   Dysuria    Kathryn [name] is a 52 y.o. female.  HPI Patient with multiple medical issues including prior lumbar spine surgery now presents with back pain, dysuria.  She notes her pain began after a minor fall, falling on her backside yesterday.  The past few hours she has developed new dysuria, with frequency, and pressure sensation in her suprapubic region.  No fever, vomiting, symptoms are worsening with no relief from anything.  She has multiple allergies, and only takes Cymbalta, Lyrica for pain control. No new distal loss of sensation or weakness though she notes she has baseline paresthesia in both legs. She has not been able to walk without pain in her low back since the event.    Home Medications Prior to Admission medications   Medication Sig Start Date End Date Taking? Authorizing Provider  cephALEXin (KEFLEX) 500 MG capsule Take 1 capsule (500 mg total) by mouth 2 (two) times daily for 5 days. 07/26/22 07/31/22 Yes Gerhard Munch, MD  albuterol (PROVENTIL) (2.5 MG/3ML) 0.083% nebulizer solution Take 3 mLs (2.5 mg total) by nebulization every 6 (six) hours as needed for wheezing or shortness of breath. 06/27/22   [name], MD  albuterol (VENTOLIN HFA) 108 (90 Base) MCG/ACT inhaler Inhale 2 puffs into the lungs every 4 (four) hours as needed for wheezing or shortness of breath. 06/27/22   [name], MD  amLODipine (NORVASC) 5 MG tablet Take 1 tablet (5 mg total) by mouth daily. 07/10/22 08/09/22  Massengill, Harrold Donath, MD  BREO ELLIPTA 200-25 MCG/ACT AEPB INHALE 1 PUFF EVERY DAY 03/06/22   Anabel Halon, MD  cholecalciferol (VITAMIN D3) 25 MCG (1000 UNIT) tablet Take 1,000 Units by mouth daily.    [provider]  DULoxetine (CYMBALTA) 60 MG capsule Take 1 capsule (60 mg  total) by mouth daily. 07/11/22 08/10/22  Massengill, Harrold Donath, MD  hydrOXYzine (ATARAX) 50 MG tablet Take 2 tablets (100 mg total) by mouth every 6 (six) hours as needed for anxiety. 07/10/22   Massengill, Harrold Donath, MD  lidocaine (LIDODERM) 5 % Place 1 patch onto the skin daily. Remove & Discard patch within 12 hours or as directed by MD 07/10/22   Phineas Inches, MD  lurasidone (LATUDA) 20 MG TABS tablet Take 3 tablets (60 mg total) by mouth daily with breakfast. 07/11/22 08/10/22  Massengill, Harrold Donath, MD  nicotine (NICODERM CQ - DOSED IN MG/24 HOURS) 14 mg/24hr patch Place 1 patch (14 mg total) onto the skin daily. 07/11/22   Massengill, Harrold Donath, MD  pantoprazole (PROTONIX) 40 MG tablet Take 1 tablet (40 mg total) by mouth daily. 01/14/22   [name], MD  pregabalin (LYRICA) 150 MG capsule Take 1 capsule (150 mg total) by mouth 2 (two) times daily. 06/27/22   [name], MD  ramelteon (ROZEREM) 8 MG tablet Take 1 tablet (8 mg total) by mouth at bedtime. 07/10/22 08/09/22  Massengill, Harrold Donath, MD      Allergies    Fish allergy, Flexeril [cyclobenzaprine hcl], Ibuprofen, Shellfish allergy, Tylenol [acetaminophen], Ace inhibitors, Peanut allergen powder-dnfp, Tramadol, and Trazodone and nefazodone    Review of Systems   Review of Systems  All other systems reviewed and are negative.   Physical Exam Updated Vital Signs BP 121/75   Pulse 82   Temp Marland Kitchen)  97.5 F (36.4 C)   Resp 17   Ht 4\' 11"  (1.499 m)   Wt 73.9 kg   LMP 06/07/2020 Comment: CT showed evidence of uterus and cervix  SpO2 97%   BMI 32.92 kg/m  Physical Exam Vitals and nursing note reviewed.  Constitutional:      General: She is not in acute distress.    Appearance: She is well-developed.  HENT:     Head: Normocephalic and atraumatic.  Eyes:     Conjunctiva/sclera: Conjunctivae normal.  Cardiovascular:     Rate and Rhythm: Normal rate and regular rhythm.  Pulmonary:     Effort: Pulmonary effort is normal. No respiratory  distress.     Breath sounds: Normal breath sounds. No stridor.  Abdominal:     General: There is no distension.     Tenderness: There is no abdominal tenderness. There is no guarding.     Comments: Soft, nonperitoneal abdomen, though the patient describes pain in her suprapubic region.  Musculoskeletal:     Comments: No appreciable deformity, patient flexes each hip to command, though has some referred pain to the back bilaterally.  Skin:    General: Skin is warm and dry.  Neurological:     Mental Status: She is alert and oriented to person, place, and time.     Cranial Nerves: No cranial nerve deficit.     Comments: Patient describes tingling in her legs, but has no explicit sensory loss, nor loss of function.  Psychiatric:        Mood and Affect: Mood normal.     ED Results / Procedures / Treatments   Labs (all labs ordered are listed, but only abnormal results are displayed) Labs Reviewed  COMPREHENSIVE METABOLIC PANEL - Abnormal; Notable for the following components:      Result Value   Potassium 3.4 (*)    Glucose, Bld 104 (*)    All other components within normal limits  CBC WITH DIFFERENTIAL/PLATELET - Abnormal; Notable for the following components:   WBC 11.5 (*)    Hemoglobin 11.5 (*)    HCT 35.1 (*)    RDW 17.1 (*)    Platelets 729 (*)    All other components within normal limits  URINALYSIS, ROUTINE W REFLEX MICROSCOPIC - Abnormal; Notable for the following components:   APPearance HAZY (*)    Specific Gravity, Urine 1.032 (*)    Ketones, ur 5 (*)    Protein, ur 30 (*)    Leukocytes,Ua MODERATE (*)    Bacteria, UA RARE (*)    All other components within normal limits    EKG None  Radiology CT L-SPINE NO CHARGE  Result Date: 07/26/2022 CLINICAL DATA:  Lower abdominal and back pain with dysuria. EXAM: CT LUMBAR SPINE WITHOUT CONTRAST TECHNIQUE: Multidetector CT imaging of the lumbar spine was performed without intravenous contrast administration.  Multiplanar CT image reconstructions were also generated. RADIATION DOSE REDUCTION: This exam was performed according to the departmental dose-optimization program which includes automated exposure control, adjustment of the mA and/or kV according to patient size and/or use of iterative reconstruction technique. COMPARISON:  CT abdomen pelvis dated July 08, 2022. FINDINGS: Segmentation: 5 lumbar type vertebrae. Alignment: Normal. Vertebrae: No acute fracture or focal pathologic process. Prior L3-L5 PLIF. No evidence of hardware failure or loosening. Paraspinal and other soft tissues: Please see separate CT abdomen and pelvis report from same day. Disc levels: T12-L1:  Minimal disc bulging.  No stenosis. L1-L2:  Negative. L2-L3: Minimal disc bulging.  Mild-to-moderate bilateral facet arthropathy. No stenosis. L3-L4: Prior posterior decompression and PLIF. Spinal canal is largely obscured by streak artifact. No neuroforaminal stenosis. L4-L5: Prior posterior decompression and PLIF. Spinal canal is largely obscured by streak artifact. No neuroforaminal stenosis. L5-S1: Mild disc bulging. Moderate left and mild right facet arthropathy. Moderate left and mild right neuroforaminal stenosis. No spinal canal stenosis. IMPRESSION: 1. Prior L3-L5 PLIF without evidence of hardware complication. 2. 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 Electronically Signed   By: Obie Dredge M.D.   On: 07/26/2022 08:59   CT Renal Stone Study  Result Date: 07/26/2022 CLINICAL DATA:  Lower abdominal and back pain with dysuria. EXAM: CT ABDOMEN AND PELVIS WITHOUT CONTRAST TECHNIQUE: Multidetector CT imaging of the abdomen and pelvis was performed following the standard protocol without IV contrast. RADIATION DOSE REDUCTION: This exam was performed according to the departmental dose-optimization program which includes automated exposure control, adjustment of the mA and/or kV according to  patient size and/or use of iterative reconstruction technique. COMPARISON:  CT abdomen pelvis dated July 08, 2022. FINDINGS: Lower chest: No acute abnormality. Hepatobiliary: No focal liver abnormality is seen. No gallstones, gallbladder wall thickening, or biliary dilatation. Pancreas: Unremarkable. No pancreatic ductal dilatation or surrounding inflammatory changes. Spleen: Normal in size without focal abnormality. Adrenals/Urinary Tract: Adrenal glands are unremarkable. Kidneys are normal, without renal calculi, focal lesion, or hydronephrosis. Mild circumferential bladder wall thickening may be part related to underdistention. Stomach/Bowel: Stomach is within normal limits. Appendix appears normal. No evidence of bowel wall thickening, distention, or inflammatory changes. Vascular/Lymphatic: Aortic atherosclerosis. No enlarged abdominal or pelvic lymph nodes. Reproductive: Bulky, heterogeneous cervix with nabothian cysts, similar to prior study. Unchanged fibroid uterus. Unchanged 2.3 cm simple cyst in the left ovary. No follow-up imaging is recommended. The right ovary is unremarkable. Other: No abdominal wall hernia or abnormality. No abdominopelvic ascites. No pneumoperitoneum. Musculoskeletal: No acute or significant osseous findings. Prior L3-L5 fusion. 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 IMPRESSION: 1. No acute intra-abdominal process. No urolithiasis. 2. Mild circumferential bladder wall thickening may be part related to underdistention. Correlate with urinalysis to exclude cystitis. 3. Unchanged fibroid uterus with bulky, heterogeneous cervix. Correlate with Pap smears and consider direct visualization. 4.  Aortic Atherosclerosis (ICD10-I70.0). Electronically Signed   By: Obie Dredge M.D.   On: 07/26/2022 08:54    Procedures Procedures    Medications Ordered in ED Medications  ketamine 50 mg in normal saline 5 mL (10 mg/mL) syringe (has no  administration in time range)  cefTRIAXone (ROCEPHIN) 1 g in sodium chloride 0.9 % 100 mL IVPB (1 g Intravenous New Bag/Given 07/26/22 1119)    ED Course/ Medical Decision Making/ A&P                             Medical Decision Making Adult female with multiple medical problems including prior lumbar spine surgery, schizoaffective disorder, presents with both urinary complaints and low back pain following a fall.  Differential includes urinary tract disease, spinal injury, given her history, fall, compression fracture is a consideration. Patient is awake, alert, neurologically seemingly at baseline. CT labs urine ordered.  Amount and/or Complexity of Data Reviewed External Data Reviewed: notes. Labs: ordered. Decision-making details documented in ED Course. Radiology: ordered and independent interpretation performed. Decision-making details documented in ED Course.  Risk Prescription drug management. Decision regarding hospitalization.   12:01 PM Patient in no  distress, awake, alert, hemodynamically unremarkable.  X-ray, CT reviewed, no notable findings, lumbar hardware unremarkable. Patient found to have evidence for urinary tract infection, will receive initial antibiotics here, analgesics, will be discharged to follow-up with primary care. No evidence for bacteremia, sepsis or other new neurologic phenomena.        Final Clinical Impression(s) / ED Diagnoses Final diagnoses:  Lower urinary tract infectious disease    Rx / DC Orders ED Discharge Orders          Ordered    cephALEXin (KEFLEX) 500 MG capsule  2 times daily        07/26/22 1201              Gerhard MunchLockwood, Kyion Gautier, MD 07/26/22 1201

## 2022-07-26 NOTE — ED Triage Notes (Signed)
Pt reports that she has lower abdominal pain with dysuria. Pt also reports she has lower back pain.

## 2022-07-26 NOTE — Discharge Instructions (Addendum)
As discussed, your evaluation today has been largely reassuring.  But, it is important that you monitor your condition carefully, and do not hesitate to return to the ED if you develop new, or concerning changes in your condition. ? ?Otherwise, please follow-up with your physician for appropriate ongoing care. ? ?

## 2022-07-31 ENCOUNTER — Emergency Department (HOSPITAL_COMMUNITY)
Admission: EM | Admit: 2022-07-31 | Discharge: 2022-07-31 | Disposition: A | Discharge status: Home / Self Care | Payer: Medicare HMO | Attending: Emergency Medicine | Admitting: Emergency Medicine

## 2022-07-31 ENCOUNTER — Other Ambulatory Visit: Payer: Self-pay

## 2022-07-31 ENCOUNTER — Encounter (HOSPITAL_COMMUNITY): Payer: Self-pay | Admitting: Emergency Medicine

## 2022-07-31 DIAGNOSIS — Z91010 Allergy to peanuts: Secondary | ICD-10-CM | POA: Insufficient documentation

## 2022-07-31 DIAGNOSIS — Z79899 Other long term (current) drug therapy: Secondary | ICD-10-CM | POA: Diagnosis not present

## 2022-07-31 DIAGNOSIS — R300 Dysuria: Secondary | ICD-10-CM | POA: Insufficient documentation

## 2022-07-31 DIAGNOSIS — M545 Low back pain, unspecified: Secondary | ICD-10-CM | POA: Insufficient documentation

## 2022-07-31 DIAGNOSIS — R52 Pain, unspecified: Secondary | ICD-10-CM

## 2022-07-31 DIAGNOSIS — M79604 Pain in right leg: Secondary | ICD-10-CM | POA: Insufficient documentation

## 2022-07-31 DIAGNOSIS — M79605 Pain in left leg: Secondary | ICD-10-CM | POA: Insufficient documentation

## 2022-07-31 LAB — I-STAT CHEM 8, ED
BUN: 13 mg/dL (ref 6–20)
Calcium, Ion: 1.16 mmol/L (ref 1.15–1.40)
Chloride: 104 mmol/L (ref 98–111)
Creatinine, Ser: 0.7 mg/dL (ref 0.44–1.00)
Glucose, Bld: 75 mg/dL (ref 70–99)
HCT: 38 % (ref 36.0–46.0)
Hemoglobin: 12.9 g/dL (ref 12.0–15.0)
Potassium: 3.9 mmol/L (ref 3.5–5.1)
Sodium: 138 mmol/L (ref 135–145)
TCO2: 25 mmol/L (ref 22–32)

## 2022-07-31 LAB — URINALYSIS, W/ REFLEX TO CULTURE (INFECTION SUSPECTED)
Bilirubin Urine: NEGATIVE
Glucose, UA: NEGATIVE mg/dL
Hgb urine dipstick: NEGATIVE
Ketones, ur: NEGATIVE mg/dL
Nitrite: NEGATIVE
Protein, ur: 30 mg/dL — AB
Specific Gravity, Urine: 1.029 (ref 1.005–1.030)
pH: 5 (ref 5.0–8.0)

## 2022-07-31 MED ORDER — DEXAMETHASONE SODIUM PHOSPHATE 10 MG/ML IJ SOLN
10.0000 mg | Freq: Once | INTRAMUSCULAR | Status: AC
  Administered 2022-07-31: 10 mg via INTRAMUSCULAR
  Filled 2022-07-31: qty 1

## 2022-07-31 MED ORDER — PHENAZOPYRIDINE HCL 200 MG PO TABS: 200.0000 mg | ORAL_TABLET | Freq: Three times a day (TID) | ORAL | 0 refills | Status: DC

## 2022-07-31 MED ORDER — PREDNISONE 20 MG PO TABS: 40.0000 mg | ORAL_TABLET | Freq: Every day | ORAL | 0 refills | Status: DC

## 2022-07-31 NOTE — ED Notes (Signed)
DC instructions and scripts reviewed with pt no questions or concerns at this time. Will follow upw with pcp. Pt wheeled outside to wait for daughter to transport home.

## 2022-07-31 NOTE — Discharge Instructions (Addendum)
Today's evaluation has been reassuring.  Please continue taking your long-term medications including Cymbalta and Lyrica as well as your newly prescribed medication which should assist with your painful urination. Follow-up with your physician as scheduled or return here for concerning changes in your condition.

## 2022-07-31 NOTE — ED Provider Notes (Signed)
[place] EMERGENCY DEPARTMENT AT [facility] Provider Note   CSN: [id number] Arrival date & time: 07/31/22  4098     History  Chief Complaint  Patient presents with   Back Pain   Leg Pain    Kathryn [name] is a 52 y.o. female.  HPI Patient presents with concern for feeling sore all over.  She notes that she has been walking substantially, and did not follow-up with her physician after I last saw this patient about 5 days ago due to lack of automobile, though she has an appointment scheduled on 2 weeks. She presents today with pain all over, primarily low back, legs, questionable ongoing dysuria as well. She notes that she completed her antibiotic course.    Home Medications Prior to Admission medications   Medication Sig Start Date End Date Taking? Authorizing Provider  phenazopyridine (PYRIDIUM) 200 MG tablet Take 1 tablet (200 mg total) by mouth 3 (three) times daily. 07/31/22  Yes Gerhard Munch, MD  predniSONE (DELTASONE) 20 MG tablet Take 2 tablets (40 mg total) by mouth daily with breakfast. For the next four days 07/31/22  Yes Gerhard Munch, MD  albuterol (PROVENTIL) (2.5 MG/3ML) 0.083% nebulizer solution Take 3 mLs (2.5 mg total) by nebulization every 6 (six) hours as needed for wheezing or shortness of breath. 06/27/22   [name], MD  albuterol (VENTOLIN HFA) 108 (90 Base) MCG/ACT inhaler Inhale 2 puffs into the lungs every 4 (four) hours as needed for wheezing or shortness of breath. 06/27/22   [name], MD  amLODipine (NORVASC) 5 MG tablet Take 1 tablet (5 mg total) by mouth daily. 07/10/22 08/09/22  Massengill, Harrold Donath, MD  BREO ELLIPTA 200-25 MCG/ACT AEPB INHALE 1 PUFF EVERY DAY 03/06/22   Anabel Halon, MD  cephALEXin (KEFLEX) 500 MG capsule Take 1 capsule (500 mg total) by mouth 2 (two) times daily for 5 days. 07/26/22 07/31/22  Gerhard Munch, MD  cholecalciferol (VITAMIN D3) 25 MCG (1000 UNIT) tablet Take 1,000 Units by mouth daily.    [provider]  DULoxetine (CYMBALTA) 60 MG capsule Take 1 capsule (60 mg total) by mouth daily. 07/11/22 08/10/22  Massengill, Harrold Donath, MD  hydrOXYzine (ATARAX) 50 MG tablet Take 2 tablets (100 mg total) by mouth every 6 (six) hours as needed for anxiety. 07/10/22   Massengill, Harrold Donath, MD  lidocaine (LIDODERM) 5 % Place 1 patch onto the skin daily. Remove & Discard patch within 12 hours or as directed by MD 07/10/22   Phineas Inches, MD  lurasidone (LATUDA) 20 MG TABS tablet Take 3 tablets (60 mg total) by mouth daily with breakfast. 07/11/22 08/10/22  Massengill, Harrold Donath, MD  nicotine (NICODERM CQ - DOSED IN MG/24 HOURS) 14 mg/24hr patch Place 1 patch (14 mg total) onto the skin daily. 07/11/22   Massengill, Harrold Donath, MD  pantoprazole (PROTONIX) 40 MG tablet Take 1 tablet (40 mg total) by mouth daily. 01/14/22   [name], MD  pregabalin (LYRICA) 150 MG capsule Take 1 capsule (150 mg total) by mouth 2 (two) times daily. 06/27/22   [name], MD  ramelteon (ROZEREM) 8 MG tablet Take 1 tablet (8 mg total) by mouth at bedtime. 07/10/22 08/09/22  Massengill, Harrold Donath, MD      Allergies    Fish allergy, Flexeril [cyclobenzaprine hcl], Ibuprofen, Shellfish allergy, Tylenol [acetaminophen], Ace inhibitors, Peanut allergen powder-dnfp, Tramadol, and Trazodone and nefazodone    Review of Systems   Review of Systems  All other systems reviewed and are negative.  Physical Exam Updated Vital Signs BP (!) 134/95 (BP Location: Right Arm)   Pulse 79   Temp 97.9 F (36.6 C) (Oral)   Resp 20   LMP 06/07/2020 Comment: CT showed evidence of uterus and cervix  SpO2 97%  Physical Exam Vitals and nursing note reviewed.  Constitutional:      General: She is not in acute distress.    Appearance: She is well-developed. She is obese. She is not ill-appearing, toxic-appearing or diaphoretic.  HENT:     Head: Normocephalic and atraumatic.  Eyes:     Conjunctiva/sclera: Conjunctivae normal.  Cardiovascular:      Rate and Rhythm: Normal rate and regular rhythm.  Pulmonary:     Effort: Pulmonary effort is normal. No respiratory distress.     Breath sounds: Normal breath sounds. No stridor.  Abdominal:     General: There is no distension.  Skin:    General: Skin is warm and dry.  Neurological:     General: No focal deficit present.     Mental Status: She is alert and oriented to person, place, and time.     Cranial Nerves: No cranial nerve deficit.     Motor: No weakness.  Psychiatric:        Mood and Affect: Mood normal.     ED Results / Procedures / Treatments   Labs (all labs ordered are listed, but only abnormal results are displayed) Labs Reviewed  URINALYSIS, W/ REFLEX TO CULTURE (INFECTION SUSPECTED) - Abnormal; Notable for the following components:      Result Value   APPearance CLOUDY (*)    Protein, ur 30 (*)    Leukocytes,Ua LARGE (*)    Bacteria, UA RARE (*)    All other components within normal limits  I-STAT CHEM 8, ED    EKG None  Radiology No results found.  Procedures Procedures    Medications Ordered in ED Medications  dexamethasone (DECADRON) injection 10 mg (has no administration in time range)    ED Course/ Medical Decision Making/ A&P                             Medical Decision Making Patient with multiple medical problems, presents with diffuse discomfort, questionable dysuria.  She is awake, alert, hemodynamically unremarkable, in similar condition to when I evaluated her last week.  No early evidence of bacteremia, sepsis.  Urinalysis without evidence for infection, though she may have some mild cystitis. Labs without evidence for dehydration, renal dysfunction, low suspicion for rhabdomyolysis, absent fall, trauma, the patient encouraged to drink plenty fluids, get plenty rest, take meds as directed, and follow-up with primary care as scheduled next week.  Amount and/or Complexity of Data Reviewed External Data Reviewed: notes. Labs: ordered.  Decision-making details documented in ED Course.  Risk OTC drugs. Prescription drug management. Decision regarding hospitalization.         Final Clinical Impression(s) / ED Diagnoses Final diagnoses:  Pain  Dysuria    Rx / DC Orders ED Discharge Orders          Ordered    phenazopyridine (PYRIDIUM) 200 MG tablet  3 times daily        07/31/22 1303    predniSONE (DELTASONE) 20 MG tablet  Daily with breakfast        07/31/22 1303              Gerhard Munch, MD 07/31/22 1304

## 2022-07-31 NOTE — ED Triage Notes (Signed)
Pt c/o chronic right knee pain. Walked a lot yesterday and now both legs and lower back hurts. Has not taken anything for pain. Nad. Pt has been ambulatory. Currently has UTI and just finished abx but still hurting with urination per pt.

## 2022-08-02 ENCOUNTER — Encounter (HOSPITAL_COMMUNITY): Payer: Self-pay | Admitting: *Deleted

## 2022-08-02 ENCOUNTER — Emergency Department (HOSPITAL_COMMUNITY)
Admission: EM | Admit: 2022-08-02 | Discharge: 2022-08-02 | Disposition: A | Discharge status: Home / Self Care | Payer: Medicare HMO | Attending: Emergency Medicine | Admitting: Emergency Medicine

## 2022-08-02 ENCOUNTER — Other Ambulatory Visit: Payer: Self-pay

## 2022-08-02 DIAGNOSIS — J449 Chronic obstructive pulmonary disease, unspecified: Secondary | ICD-10-CM | POA: Insufficient documentation

## 2022-08-02 DIAGNOSIS — M5459 Other low back pain: Secondary | ICD-10-CM | POA: Diagnosis not present

## 2022-08-02 DIAGNOSIS — J45909 Unspecified asthma, uncomplicated: Secondary | ICD-10-CM | POA: Diagnosis not present

## 2022-08-02 DIAGNOSIS — M545 Low back pain, unspecified: Secondary | ICD-10-CM | POA: Diagnosis not present

## 2022-08-02 DIAGNOSIS — Z91010 Allergy to peanuts: Secondary | ICD-10-CM | POA: Insufficient documentation

## 2022-08-02 LAB — URINALYSIS, W/ REFLEX TO CULTURE (INFECTION SUSPECTED)
Bacteria, UA: NONE SEEN
Bilirubin Urine: NEGATIVE
Glucose, UA: >=500 mg/dL — AB
Hgb urine dipstick: NEGATIVE
Ketones, ur: NEGATIVE mg/dL
Nitrite: NEGATIVE
Protein, ur: 30 mg/dL — AB
Specific Gravity, Urine: 1.035 — ABNORMAL HIGH (ref 1.005–1.030)
pH: 5 (ref 5.0–8.0)

## 2022-08-02 MED ORDER — HYDROMORPHONE HCL 1 MG/ML IJ SOLN
1.0000 mg | Freq: Once | INTRAMUSCULAR | Status: AC
  Administered 2022-08-02: 1 mg via INTRAMUSCULAR
  Filled 2022-08-02: qty 1

## 2022-08-02 NOTE — ED Triage Notes (Signed)
Pt in c/o lower back pain that radiates down both legs, pt seen previously for same symptoms on Monday, pt reports urine incontinence on 3/20 and has not improved, pt denies new injury, pt hx of back surgery 2021, A&O x4

## 2022-08-02 NOTE — Discharge Instructions (Signed)
Your urine test today did not show evidence of infection.  Please follow-up with your primary care provider for recheck.

## 2022-08-02 NOTE — ED Provider Notes (Signed)
[place] EMERGENCY DEPARTMENT AT [facility] Provider Note   CSN: [id number] Arrival date & time: 08/02/22  4098     History  Chief Complaint  Patient presents with   Back Pain    Kathryn [name] is a 52 y.o. female.   Back Pain Associated symptoms: abdominal pain and dysuria   Associated symptoms: no chest pain, no fever and no headaches         Kathryn [name] is a 52 y.o. female with past medical history of pretension, COPD, anxiety, asthma, chronic back pain who presents to the Emergency Department complaining of persistent low back pain for several weeks.  Has been here for evaluation on 07/26/2022 and again on 07/31/2022.  Describes aching pain across her lower back that radiates into her lower abdomen.  She reports having urinary hesitancy and burning with urination.  She recently completed a course of cephalexin without improvement.  She was prescribed prednisone and Pyridium on her ER visit on 415 but states the Pyridium was too expensive and she was unable to afford to pay for it.  She denies any new injuries, numbness or weakness of her lower extremities.  She has chronic pain of her right knee and is awaiting surgery.  She denies any urinary retention or incontinence bowel incontinence or retention.  No fever or chills.    Home Medications Prior to Admission medications   Medication Sig Start Date End Date Taking? Authorizing Provider  albuterol (PROVENTIL) (2.5 MG/3ML) 0.083% nebulizer solution Take 3 mLs (2.5 mg total) by nebulization every 6 (six) hours as needed for wheezing or shortness of breath. 06/27/22   [name], MD  albuterol (VENTOLIN HFA) 108 (90 Base) MCG/ACT inhaler Inhale 2 puffs into the lungs every 4 (four) hours as needed for wheezing or shortness of breath. 06/27/22   [name], MD  amLODipine (NORVASC) 5 MG tablet Take 1 tablet (5 mg total) by mouth daily. 07/10/22 08/09/22  Massengill, [place] Donath, MD  BREO ELLIPTA 200-25 MCG/ACT AEPB  INHALE 1 PUFF EVERY DAY 03/06/22   Anabel Halon, MD  cholecalciferol (VITAMIN D3) 25 MCG (1000 UNIT) tablet Take 1,000 Units by mouth daily.    [provider]  DULoxetine (CYMBALTA) 60 MG capsule Take 1 capsule (60 mg total) by mouth daily. 07/11/22 08/10/22  Massengill, [place] Donath, MD  hydrOXYzine (ATARAX) 50 MG tablet Take 2 tablets (100 mg total) by mouth every 6 (six) hours as needed for anxiety. 07/10/22   Massengill, [place] Donath, MD  lidocaine (LIDODERM) 5 % Place 1 patch onto the skin daily. Remove & Discard patch within 12 hours or as directed by MD 07/10/22   Phineas Inches, MD  lurasidone (LATUDA) 20 MG TABS tablet Take 3 tablets (60 mg total) by mouth daily with breakfast. 07/11/22 08/10/22  Massengill, [place] Donath, MD  nicotine (NICODERM CQ - DOSED IN MG/24 HOURS) 14 mg/24hr patch Place 1 patch (14 mg total) onto the skin daily. 07/11/22   Massengill, [place] Donath, MD  pantoprazole (PROTONIX) 40 MG tablet Take 1 tablet (40 mg total) by mouth daily. 01/14/22   [name], MD  phenazopyridine (PYRIDIUM) 200 MG tablet Take 1 tablet (200 mg total) by mouth 3 (three) times daily. 07/31/22   Gerhard Munch, MD  predniSONE (DELTASONE) 20 MG tablet Take 2 tablets (40 mg total) by mouth daily with breakfast. For the next four days 07/31/22   Gerhard Munch, MD  pregabalin (LYRICA) 150 MG capsule Take 1 capsule (150 mg total) by mouth 2 (two)  times daily. 06/27/22   [name], MD  ramelteon (ROZEREM) 8 MG tablet Take 1 tablet (8 mg total) by mouth at bedtime. 07/10/22 08/09/22  Massengill, [place] Donath, MD      Allergies    Fish allergy, Flexeril [cyclobenzaprine hcl], Ibuprofen, Shellfish allergy, Tylenol [acetaminophen], Ace inhibitors, Peanut allergen powder-dnfp, Tramadol, and Trazodone and nefazodone    Review of Systems   Review of Systems  Constitutional:  Negative for chills and fever.  Respiratory:  Negative for shortness of breath.   Cardiovascular:  Negative for chest pain.  Gastrointestinal:   Positive for abdominal pain. Negative for diarrhea, nausea and vomiting.  Genitourinary:  Positive for difficulty urinating and dysuria. Negative for decreased urine volume, flank pain, frequency, vaginal bleeding and vaginal pain.  Musculoskeletal:  Positive for back pain.  Skin:  Negative for color change and rash.  Neurological:  Negative for dizziness and headaches.    Physical Exam Updated Vital Signs Ht 4\' 11"  (1.499 m)   Wt 73.9 kg   LMP 06/07/2020 Comment: CT showed evidence of uterus and cervix  BMI 32.92 kg/m  Physical Exam Vitals and nursing note reviewed.  Constitutional:      General: She is not in acute distress.    Appearance: Normal appearance. She is not toxic-appearing.  Cardiovascular:     Rate and Rhythm: Normal rate and regular rhythm.     Pulses: Normal pulses.  Pulmonary:     Effort: Pulmonary effort is normal.  Chest:     Chest wall: No tenderness.  Abdominal:     Palpations: Abdomen is soft.     Tenderness: There is abdominal tenderness in the suprapubic area. There is no right CVA tenderness or left CVA tenderness.  Musculoskeletal:        General: Tenderness (diffuse ttp of the bilateral lumbar paraspinal muscles) present. Normal range of motion.  Skin:    General: Skin is warm.     Capillary Refill: Capillary refill takes less than 2 seconds.     Findings: No rash.  Neurological:     General: No focal deficit present.     Mental Status: She is alert.     Sensory: No sensory deficit.     Motor: No weakness.     Gait: Gait is intact.     ED Results / Procedures / Treatments   Labs (all labs ordered are listed, but only abnormal results are displayed) Labs Reviewed  URINALYSIS, W/ REFLEX TO CULTURE (INFECTION SUSPECTED) - Abnormal; Notable for the following components:      Result Value   Specific Gravity, Urine 1.035 (*)    Glucose, UA >=500 (*)    Protein, ur 30 (*)    Leukocytes,Ua TRACE (*)    All other components within normal limits     EKG None  Radiology No results found.  Procedures Procedures    Medications Ordered in ED Medications  HYDROmorphone (DILAUDID) injection 1 mg (has no administration in time range)    ED Course/ Medical Decision Making/ A&P                             Medical Decision Making Patient here with multiple ER visits for same.  Seen 2 days ago.  Was prescribed prednisone and Pyridium on her last ER visit.  She was unable to afford the Pyridium.  Taking prednisone without relief.  Denies any new symptoms.  Continues to have urinary hesitancy and burning.  Low back pain with history of same.  No reported saddle anesthesias.  Differential would include but not limited to acute on chronic low back pain, persistent UTI, pyelonephritis, cauda equina  Patient's prior medical records reviewed by me, had CT renal stone study and no charge CT L-spine without acute findings.  Chem-8 from Monday was unremarkable.  Urinalysis obtained on 4/15 showed large amount of squamous cells and recollection was suggested.  Amount and/or Complexity of Data Reviewed Discussion of management or test interpretation with external provider(s): This is patient's third ER visit this month for similar complaints.  She had extensive workup on 07/26/2022.  Was treated for UTI with antibiotics.  She is completed course.  Continues to report dysuria symptoms urine today is reassuring.  No concerning symptoms for emergent neurological process.  No red flags concerning for cauda equina.  She is ambulatory in the department with steady gait.  Pain improved after pain medication given here.  Feel she is appropriate for discharge home have recommended close outpatient follow-up with PCP.  Patient has requested prescription for pain medication.  Database was reviewed.  I do not feel there is an emergent indication for narcotic pain medication.  Currently taking prednisone for her back pain feel that she can continue  over-the-counter pain medications or lidocaine patches if needed.  Risk Prescription drug management.           Final Clinical Impression(s) / ED Diagnoses Final diagnoses:  Acute bilateral low back pain without sciatica    Rx / DC Orders ED Discharge Orders     None         Pauline Aus, PA-C 08/02/22 1403    Benjiman Core, MD 08/02/22 1539

## 2022-08-05 ENCOUNTER — Emergency Department (HOSPITAL_COMMUNITY)
Admission: EM | Admit: 2022-08-05 | Discharge: 2022-08-05 | Disposition: A | Discharge status: Home / Self Care | Payer: Medicare HMO | Attending: Emergency Medicine | Admitting: Emergency Medicine

## 2022-08-05 ENCOUNTER — Other Ambulatory Visit: Payer: Self-pay

## 2022-08-05 DIAGNOSIS — M545 Low back pain, unspecified: Secondary | ICD-10-CM | POA: Diagnosis not present

## 2022-08-05 DIAGNOSIS — R300 Dysuria: Secondary | ICD-10-CM | POA: Diagnosis not present

## 2022-08-05 DIAGNOSIS — M5459 Other low back pain: Secondary | ICD-10-CM | POA: Diagnosis not present

## 2022-08-05 LAB — URINALYSIS, ROUTINE W REFLEX MICROSCOPIC
Bilirubin Urine: NEGATIVE
Glucose, UA: NEGATIVE mg/dL
Hgb urine dipstick: NEGATIVE
Ketones, ur: 5 mg/dL — AB
Nitrite: NEGATIVE
Protein, ur: 30 mg/dL — AB
Specific Gravity, Urine: 1.023 (ref 1.005–1.030)
pH: 5 (ref 5.0–8.0)

## 2022-08-05 MED ORDER — HYDROMORPHONE HCL 1 MG/ML IJ SOLN
2.0000 mg | Freq: Once | INTRAMUSCULAR | Status: AC
  Administered 2022-08-05: 2 mg via INTRAMUSCULAR
  Filled 2022-08-05: qty 2

## 2022-08-05 MED ORDER — OXYCODONE-ACETAMINOPHEN 5-325 MG PO TABS
1.0000 | ORAL_TABLET | Freq: Once | ORAL | Status: AC
  Administered 2022-08-05: 1 via ORAL
  Filled 2022-08-05: qty 1

## 2022-08-05 MED ORDER — LIDOCAINE 5 % EX PTCH
1.0000 | MEDICATED_PATCH | CUTANEOUS | Status: DC
  Administered 2022-08-05: 1 via TRANSDERMAL
  Filled 2022-08-05: qty 1

## 2022-08-05 MED ORDER — OXYCODONE HCL 5 MG PO CAPS: 5.0000 mg | ORAL_CAPSULE | Freq: Four times a day (QID) | ORAL | 0 refills | Status: DC | PRN

## 2022-08-05 NOTE — Discharge Instructions (Signed)
You were seen in the emergency department for continued low back pain.  You were given pain medication with some improvement in your symptoms.  It will be important for you to establish care with a primary care doctor so they can help you with your symptoms and possibly refer you on to pain clinic.  Return to the emergency department if any high fevers or worsening symptoms.

## 2022-08-05 NOTE — ED Notes (Signed)
EDP at bedside during triage 

## 2022-08-05 NOTE — ED Triage Notes (Signed)
Pt c/o lower back and bilateral leg pain started last night. Pt had previous back surgery.

## 2022-08-05 NOTE — ED Provider Notes (Signed)
[place] EMERGENCY DEPARTMENT AT [facility] Provider Note   CSN: [id number] Arrival date & time: 08/05/22  4098     History  Chief Complaint  Patient presents with   Back Pain   Leg Pain    Kathryn [name] is a 52 y.o. female.  She has a history of chronic back pain.  Complaining of worsening pain in her back into her hips and down her legs that is been going on over a month.  It is associated with a little bit is dysuria although no hematuria no fevers no abdominal pain vomiting no incontinence.  She has been seen in the ED 3 times this month, had a CT renal and CT lumbar spine which showed L5-S1 mild disc bulging and facet arthropathy foraminal stenosis.  She has been on steroids without improvement.  She tells me her orthopedic doctor is trying to get her into the pain clinic but she does not have a primary care doctor.  She said oxycodone has helped in the past.  No fevers or chills.  No red flags.  The history is provided by the patient.  Back Pain Location:  Lumbar spine Quality:  Aching Pain severity:  Severe Pain is:  Same all the time Onset quality:  Gradual Duration:  1 month Timing:  Constant Progression:  Unchanged Chronicity:  Chronic Relieved by:  Nothing Worsened by:  Ambulation and bending Ineffective treatments:  None tried Associated symptoms: dysuria and leg pain   Associated symptoms: no abdominal pain, no bladder incontinence, no bowel incontinence and no fever   Risk factors: no hx of cancer   Leg Pain Associated symptoms: back pain   Associated symptoms: no fever        Home Medications Prior to Admission medications   Medication Sig Start Date End Date Taking? Authorizing Provider  albuterol (PROVENTIL) (2.5 MG/3ML) 0.083% nebulizer solution Take 3 mLs (2.5 mg total) by nebulization every 6 (six) hours as needed for wheezing or shortness of breath. 06/27/22   [name], MD  albuterol (VENTOLIN HFA) 108 (90 Base) MCG/ACT inhaler  Inhale 2 puffs into the lungs every 4 (four) hours as needed for wheezing or shortness of breath. 06/27/22   [name], MD  amLODipine (NORVASC) 5 MG tablet Take 1 tablet (5 mg total) by mouth daily. 07/10/22 08/09/22  Massengill, Harrold Donath, MD  BREO ELLIPTA 200-25 MCG/ACT AEPB INHALE 1 PUFF EVERY DAY 03/06/22   Anabel Halon, MD  cholecalciferol (VITAMIN D3) 25 MCG (1000 UNIT) tablet Take 1,000 Units by mouth daily.    [provider]  DULoxetine (CYMBALTA) 60 MG capsule Take 1 capsule (60 mg total) by mouth daily. 07/11/22 08/10/22  Massengill, Harrold Donath, MD  hydrOXYzine (ATARAX) 50 MG tablet Take 2 tablets (100 mg total) by mouth every 6 (six) hours as needed for anxiety. 07/10/22   Massengill, Harrold Donath, MD  lidocaine (LIDODERM) 5 % Place 1 patch onto the skin daily. Remove & Discard patch within 12 hours or as directed by MD 07/10/22   Phineas Inches, MD  lurasidone (LATUDA) 20 MG TABS tablet Take 3 tablets (60 mg total) by mouth daily with breakfast. 07/11/22 08/10/22  Massengill, Harrold Donath, MD  nicotine (NICODERM CQ - DOSED IN MG/24 HOURS) 14 mg/24hr patch Place 1 patch (14 mg total) onto the skin daily. 07/11/22   Massengill, Har[place]ld Donath, MD  pantoprazole (PROTONIX) 40 MG tablet Take 1 tablet (40 mg total) by mouth daily. 01/14/22   [name], MD  phenazopyridine (PYRIDIUM) 200  MG tablet Take 1 tablet (200 mg total) by mouth 3 (three) times daily. 07/31/22   Gerhard Munch, MD  predniSONE (DELTASONE) 20 MG tablet Take 2 tablets (40 mg total) by mouth daily with breakfast. For the next four days 07/31/22   Gerhard Munch, MD  pregabalin (LYRICA) 150 MG capsule Take 1 capsule (150 mg total) by mouth 2 (two) times daily. 06/27/22   [name], MD  ramelteon (ROZEREM) 8 MG tablet Take 1 tablet (8 mg total) by mouth at bedtime. 07/10/22 08/09/22  Massengill, Harrold Donath, MD      Allergies    Fish allergy, Flexeril [cyclobenzaprine hcl], Ibuprofen, Shellfish allergy, Tylenol [acetaminophen], Ace inhibitors,  Peanut allergen powder-dnfp, Tramadol, and Trazodone and nefazodone    Review of Systems   Review of Systems  Constitutional:  Negative for fever.  Gastrointestinal:  Negative for abdominal pain and bowel incontinence.  Genitourinary:  Positive for dysuria. Negative for bladder incontinence.  Musculoskeletal:  Positive for back pain.    Physical Exam Updated Vital Signs BP 126/78   Pulse 93   Temp (!) 97.5 F (36.4 C) (Oral)   Resp 17   Ht  (1.499 m)   Wt 76.2 kg   LMP 06/07/2020 Comment: CT showed evidence of uterus and cervix  SpO2 98%   BMI 33.93 kg/m  Physical Exam Vitals and nursing note reviewed.  Constitutional:      General: She is not in acute distress.    Appearance: Normal appearance. She is well-developed.  HENT:     Head: Normocephalic and atraumatic.  Eyes:     Conjunctiva/sclera: Conjunctivae normal.  Cardiovascular:     Rate and Rhythm: Normal rate and regular rhythm.  Pulmonary:     Effort: Pulmonary effort is normal.     Breath sounds: Normal breath sounds.  Abdominal:     Palpations: Abdomen is soft.     Tenderness: There is no abdominal tenderness. There is no guarding or rebound.  Musculoskeletal:        General: No tenderness or deformity. Normal range of motion.     Cervical back: Neck supple.  Skin:    General: Skin is warm and dry.  Neurological:     General: No focal deficit present.     Mental Status: She is alert.     GCS: GCS eye subscore is 4. GCS verbal subscore is 5. GCS motor subscore is 6.     Sensory: No sensory deficit.     Motor: No weakness.     ED Results / Procedures / Treatments   Labs (all labs ordered are listed, but only abnormal results are displayed) Labs Reviewed  URINALYSIS, ROUTINE W REFLEX MICROSCOPIC - Abnormal; Notable for the following components:      Result Value   Color, Urine AMBER (*)    APPearance CLOUDY (*)    Ketones, ur 5 (*)    Protein, ur 30 (*)    Leukocytes,Ua LARGE (*)     Bacteria, UA RARE (*)    All other components within normal limits    EKG None  Radiology No results found.  Procedures Procedures    Medications Ordered in ED Medications  oxyCODONE-acetaminophen (PERCOCET/ROXICET) 5-325 MG per tablet 1 tablet (1 tablet Oral Given 08/05/22 0741)  HYDROmorphone (DILAUDID) injection 2 mg (2 mg Intramuscular Given 08/05/22 0858)    ED Course/ Medical Decision Making/ A&P Clinical Course as of 08/05/22 1719  Sat Aug 05, 2022  0934 Urine not consistent with infection 21-50  squamous cells. [MB]  [id number] Patient states her pain is improved.  Will give her contact information for primary care.  Small update on oral pain medication. [MB]    Clinical Course User Index [MB] Terrilee Files, MD                             Medical Decision Making Amount and/or Complexity of Data Reviewed Labs: ordered.  Risk Prescription drug management.   This patient complains of worsening of chronic back and bilateral leg pain; this involves an extensive number of treatment Options and is a complaint that carries with it a high risk of complications and morbidity. The differential includes musculoskeletal pain, spinal stenosis, radiculopathy, sciatica  I ordered, reviewed and interpreted labs, which included urinalysis without clear signs of infection I ordered medication oral and IM medication and reviewed PMP when indicated. Previous records obtained and reviewed in epic including multiple recent ED visits and imaging Social determinants considered, patient with significant barriers including food transportation and housing utilities Critical Interventions: None  After the interventions stated above, I reevaluated the patient and found patient's pain to be improved Admission and further testing considered, no indications for admission at this time.  Will prescribe pain medication and recommended close follow-up with treatment team.  Given contact information  for PCP.Marland Kitchen  Return instructions discussed         Final Clinical Impression(s) / ED Diagnoses Final diagnoses:  Acute low back pain without sciatica, unspecified back pain laterality    Rx / DC Orders ED Discharge Orders          Ordered    oxycodone (OXY-IR) 5 MG capsule  Every 6 hours PRN        08/05/22 0946              Terrilee Files, MD 08/05/22 1721

## 2022-08-07 ENCOUNTER — Telehealth: Payer: Self-pay | Admitting: *Deleted

## 2022-08-07 ENCOUNTER — Telehealth: Payer: Self-pay

## 2022-08-07 DIAGNOSIS — I10 Essential (primary) hypertension: Secondary | ICD-10-CM

## 2022-08-07 NOTE — Telephone Encounter (Signed)
        Patient  visited [facility] ED on 08/02/2022  for TREATMENT    Telephone encounter attempt :  1ST  A HIPAA compliant voice message was left requesting a return call.  Instructed patient to call back at [phone] , ALSO SENT REFERRAL TO CASE MANAGEMENT . Yehuda Mao [facility] -[facility], Population Health [phone] [address] [place] , [place] [address] Email : [email or web address]

## 2022-08-07 NOTE — Patient Outreach (Signed)
Received a referral for Ms. Tripathi from [employer] of [facility] Population [facility]. I have sent this referral to the [facility] Care Guides to assign a Nurse Care Coordinator to call for follow up and determine if there are any care coordination needs.    Iverson Alamin, Donivan Scull [facility] Care Management Assistant [facility] Care Management [phone]

## 2022-08-08 ENCOUNTER — Telehealth: Payer: Self-pay | Admitting: *Deleted

## 2022-08-08 NOTE — Progress Notes (Signed)
  Care Coordination  Outreach Note  08/08/2022 Name: Kathryn [name] MRN: [id number] DOB: 01/07/1971   Care Coordination Outreach Attempts: An unsuccessful telephone outreach was attempted today to offer the patient information about available care coordination services as a benefit of their health plan.   Follow Up Plan:  Additional outreach attempts will be made to offer the patient care coordination information and services.   Encounter Outcome:  No Answer  Burman Nieves, CCMA Care Coordination Care Guide Direct Dial: [phone]

## 2022-08-09 ENCOUNTER — Telehealth: Payer: Self-pay | Admitting: *Deleted

## 2022-08-09 NOTE — Telephone Encounter (Signed)
        Patient  visited [place] on 08/05/2022  for treatment    Telephone encounter attempt :  2nd  A HIPAA compliant voice message was left requesting a return call.  Instructed patient to call back at [phone].  Yehuda Mao [facility] -[facility] [place], Population Health [phone] [address] [place] , [place] [address] Email : [email or web address]

## 2022-08-10 NOTE — Progress Notes (Signed)
  Care Coordination  Outreach Note  08/10/2022 Name: Kathryn [name] MRN: [id number] DOB: 08-Oct-1970   Care Coordination Outreach Attempts: A second unsuccessful outreach was attempted today to offer the patient with information about available care coordination services as a benefit of their health plan.     Follow Up Plan:  Additional outreach attempts will be made to offer the patient care coordination information and services.   Encounter Outcome:  No Answer  Burman Nieves, CCMA Care Coordination Care Guide Direct Dial: [phone]

## 2022-08-14 NOTE — Progress Notes (Signed)
  Care Coordination  Outreach Note  08/14/2022 Name: Kathryn [name] MRN: [id number] DOB: 03-Apr-1971   Care Coordination Outreach Attempts: A third unsuccessful outreach was attempted today to offer the patient with information about available care coordination services as a benefit of their health plan.   Follow Up Plan:  No further outreach attempts will be made at this time. We have been unable to contact the patient to offer or enroll patient in care coordination services  Encounter Outcome:  No Answer  Burman Nieves, [facility] Care Coordination Care Guide Direct Dial: [phone]

## 2022-09-06 ENCOUNTER — Ambulatory Visit: Payer: Medicare HMO | Admitting: Family Medicine

## 2022-10-04 ENCOUNTER — Emergency Department (HOSPITAL_COMMUNITY)
Admission: EM | Admit: 2022-10-04 | Discharge: 2022-10-04 | Disposition: A | Discharge status: Home / Self Care | Payer: Medicare HMO | Attending: Emergency Medicine | Admitting: Emergency Medicine

## 2022-10-04 ENCOUNTER — Encounter (HOSPITAL_COMMUNITY): Payer: Self-pay

## 2022-10-04 ENCOUNTER — Other Ambulatory Visit: Payer: Self-pay

## 2022-10-04 DIAGNOSIS — M545 Low back pain, unspecified: Secondary | ICD-10-CM | POA: Insufficient documentation

## 2022-10-04 DIAGNOSIS — Z91010 Allergy to peanuts: Secondary | ICD-10-CM | POA: Diagnosis not present

## 2022-10-04 DIAGNOSIS — S60449A External constriction of unspecified finger, initial encounter: Secondary | ICD-10-CM

## 2022-10-04 DIAGNOSIS — W4904XA Ring or other jewelry causing external constriction, initial encounter: Secondary | ICD-10-CM | POA: Insufficient documentation

## 2022-10-04 DIAGNOSIS — S6992XA Unspecified injury of left wrist, hand and finger(s), initial encounter: Secondary | ICD-10-CM | POA: Diagnosis present

## 2022-10-04 DIAGNOSIS — S60443A External constriction of left middle finger, initial encounter: Secondary | ICD-10-CM | POA: Insufficient documentation

## 2022-10-04 MED ORDER — TIZANIDINE HCL 4 MG PO TABS: 4.0000 mg | ORAL_TABLET | Freq: Four times a day (QID) | ORAL | 0 refills | Status: DC | PRN

## 2022-10-04 MED ORDER — HYDROMORPHONE HCL 1 MG/ML IJ SOLN
1.0000 mg | Freq: Once | INTRAMUSCULAR | Status: AC
  Administered 2022-10-04: 1 mg via INTRAMUSCULAR
  Filled 2022-10-04: qty 1

## 2022-10-04 MED ORDER — TIZANIDINE HCL 4 MG PO TABS
4.0000 mg | ORAL_TABLET | Freq: Once | ORAL | Status: AC
  Administered 2022-10-04: 4 mg via ORAL
  Filled 2022-10-04: qty 1

## 2022-10-04 MED ORDER — OXYCODONE HCL 5 MG PO TABS: 5.0000 mg | ORAL_TABLET | Freq: Four times a day (QID) | ORAL | 0 refills | Status: DC | PRN

## 2022-10-04 NOTE — ED Triage Notes (Signed)
Patient c/o lower back pain with radiation to bilateral legs. She states pain in sharp and burning sensation with loss of sensation to the bottom of her feet.

## 2022-10-04 NOTE — ED Notes (Signed)
Patient states she normally takes Lyrica for her pain, but she is out of her prescription and has been for the last month.  Additionally, she states she has been out of her tizanidine and has been for the last 6 months.

## 2022-10-04 NOTE — Discharge Instructions (Addendum)
Please contact your primary care provider to arrange follow-up appointment.  I have also listed some of the local primary care clinics in the area for you.  You may apply over-the-counter 4% lidocaine patches as directed on and off to your lower back as well.  You do not need a prescription for these.

## 2022-10-06 NOTE — ED Provider Notes (Signed)
EMERGENCY DEPARTMENT AT [facility] Provider Note   CSN: [id number] Arrival date & time: 10/04/22  7253     History  Chief Complaint  Patient presents with   Back Pain    Kathryn [name] is a 52 y.o. female.   Back Pain Associated symptoms: no abdominal pain, no chest pain, no dysuria, no fever, no numbness and no weakness        Kathryn [name] is a 52 y.o. female who presents to the Emergency Department complaining of low back pain.  She has history of low back pain and pain has been worse for several days.  Pain radiates into bilateral legs.  She describes having neuropathy of her feet and she is having increased burning sensation to bottom of her feet.  She denies numbness and weakness of her legs, abd pain, urine and bowel changes.  No known injury.  Pain is similar to previous back pain.    She also request removal of a ring from left middle finger.  Finger has been swollen for few days. She has attempted to remove it unsuccessfully    Home Medications Prior to Admission medications   Medication Sig Start Date End Date Taking? Authorizing Provider  oxyCODONE (ROXICODONE) 5 MG immediate release tablet Take 1 tablet (5 mg total) by mouth every 6 (six) hours as needed for severe pain. 10/04/22  Yes Alexio Sroka, PA-C  tiZANidine (ZANAFLEX) 4 MG tablet Take 1 tablet (4 mg total) by mouth every 6 (six) hours as needed for muscle spasms. 10/04/22  Yes Chanse Kagel, PA-C  albuterol (PROVENTIL) (2.5 MG/3ML) 0.083% nebulizer solution Take 3 mLs (2.5 mg total) by nebulization every 6 (six) hours as needed for wheezing or shortness of breath. 06/27/22   [name], MD  albuterol (VENTOLIN HFA) 108 (90 Base) MCG/ACT inhaler Inhale 2 puffs into the lungs every 4 (four) hours as needed for wheezing or shortness of breath. 06/27/22   [name], MD  amLODipine (NORVASC) 5 MG tablet Take 1 tablet (5 mg total) by mouth daily. 07/10/22 08/09/22  Massengill, Harrold Donath,  MD  BREO ELLIPTA 200-25 MCG/ACT AEPB INHALE 1 PUFF EVERY DAY 03/06/22   Anabel Halon, MD  cholecalciferol (VITAMIN D3) 25 MCG (1000 UNIT) tablet Take 1,000 Units by mouth daily.    [provider]  DULoxetine (CYMBALTA) 60 MG capsule Take 1 capsule (60 mg total) by mouth daily. 07/11/22 08/10/22  Massengill, Harrold Donath, MD  hydrOXYzine (ATARAX) 50 MG tablet Take 2 tablets (100 mg total) by mouth every 6 (six) hours as needed for anxiety. 07/10/22   Massengill, Harrold Donath, MD  lidocaine (LIDODERM) 5 % Place 1 patch onto the skin daily. Remove & Discard patch within 12 hours or as directed by MD 07/10/22   Phineas Inches, MD  lurasidone (LATUDA) 20 MG TABS tablet Take 3 tablets (60 mg total) by mouth daily with breakfast. 07/11/22 08/10/22  Massengill, Harrold Donath, MD  nicotine (NICODERM CQ - DOSED IN MG/24 HOURS) 14 mg/24hr patch Place 1 patch (14 mg total) onto the skin daily. 07/11/22   Massengill, Harrold Donath, MD  pantoprazole (PROTONIX) 40 MG tablet Take 1 tablet (40 mg total) by mouth daily. 01/14/22   [name], MD  phenazopyridine (PYRIDIUM) 200 MG tablet Take 1 tablet (200 mg total) by mouth 3 (three) times daily. 07/31/22   Gerhard Munch, MD  predniSONE (DELTASONE) 20 MG tablet Take 2 tablets (40 mg total) by mouth daily with breakfast. For the next four days 07/31/22  Gerhard Munch, MD  pregabalin (LYRICA) 150 MG capsule Take 1 capsule (150 mg total) by mouth 2 (two) times daily. 06/27/22   [name], MD  ramelteon (ROZEREM) 8 MG tablet Take 1 tablet (8 mg total) by mouth at bedtime. 07/10/22 08/09/22  Massengill, Harrold Donath, MD      Allergies    Fish allergy, Flexeril [cyclobenzaprine hcl], Ibuprofen, Shellfish allergy, Tylenol [acetaminophen], Ace inhibitors, Peanut allergen powder-dnfp, Tramadol, and Trazodone and nefazodone    Review of Systems   Review of Systems  Constitutional:  Negative for chills and fever.  Respiratory:  Negative for shortness of breath.   Cardiovascular:  Negative  for chest pain.  Gastrointestinal:  Negative for abdominal pain, nausea and vomiting.  Genitourinary:  Negative for dysuria.  Musculoskeletal:  Positive for arthralgias (swelling left middle finger) and back pain.  Skin:  Negative for color change and wound.  Neurological:  Negative for weakness and numbness.    Physical Exam Updated Vital Signs BP (!) 141/96 (BP Location: Right Arm)   Pulse 82   Temp (!) 97.5 F (36.4 C) (Oral)   Resp 18   Ht 4\' 11"  (1.499 m)   Wt 61.2 kg   LMP 06/07/2020 Comment: CT showed evidence of uterus and cervix  SpO2 95%   BMI 27.27 kg/m  Physical Exam Vitals and nursing note reviewed.  Constitutional:      General: She is not in acute distress.    Appearance: She is not ill-appearing or toxic-appearing.  Cardiovascular:     Rate and Rhythm: Normal rate and regular rhythm.     Pulses: Normal pulses.  Pulmonary:     Effort: Pulmonary effort is normal. No respiratory distress.  Abdominal:     Palpations: Abdomen is soft.     Tenderness: There is no abdominal tenderness.  Musculoskeletal:        General: Tenderness present.     Comments: Diffuse ttp of the lumbar paraspinal muscles.  Hip flexors and extensors are intact  Pt has a metal ring on the left middle finger that is tight on the base of the finger.  Finger distal to the ring is swollen.  No open wounds or discoloration  Skin:    General: Skin is warm.     Capillary Refill: Capillary refill takes less than 2 seconds.  Neurological:     General: No focal deficit present.     Mental Status: She is alert.     Sensory: No sensory deficit.     Motor: No weakness.     ED Results / Procedures / Treatments   Labs (all labs ordered are listed, but only abnormal results are displayed) Labs Reviewed - No data to display  EKG None  Radiology No results found.  Procedures Procedures    Ring removal Metal ring removed by me using a manual and electric ring cutter.  The ring was  successfully removed.   Pt tolerated procedure well, no complications.   Medications Ordered in ED Medications  HYDROmorphone (DILAUDID) injection 1 mg (1 mg Intramuscular Given 10/04/22 1042)  tiZANidine (ZANAFLEX) tablet 4 mg (4 mg Oral Given 10/04/22 1043)    ED Course/ Medical Decision Making/ A&P                             Medical Decision Making Pt here with complaint of low back pain for few days.  Has history of same.  No recent injury.  Pain  with movement. Denies new symptoms.    I suspect this is acute on chronic low back pain.  Cauda equina also considered but no red flags.   Amount and/or Complexity of Data Reviewed Discussion of management or test interpretation with external provider(s): Pt well appearing, no focal neuro deficits.  No red flags on exam.  She is ambulatory with a steady gait.    Risk Prescription drug management.           Final Clinical Impression(s) / ED Diagnoses Final diagnoses:  Acute low back pain without sciatica, unspecified back pain laterality  Constrictive jewelry of finger, initial encounter    Rx / DC Orders ED Discharge Orders          Ordered    oxyCODONE (ROXICODONE) 5 MG immediate release tablet  Every 6 hours PRN        10/04/22 1111    tiZANidine (ZANAFLEX) 4 MG tablet  Every 6 hours PRN        10/04/22 1111              Pauline Aus, PA-C 10/06/22 2225    Pricilla Loveless, MD 10/07/22 1442

## 2022-10-11 ENCOUNTER — Telehealth: Payer: Self-pay

## 2022-10-11 NOTE — Telephone Encounter (Signed)
Transition Care Management Unsuccessful Follow-up Telephone Call  Date of discharge and from where:  10/04/2022 [facility]  Attempts:  1st Attempt  Reason for unsuccessful TCM follow-up call:  Unable to leave message  Kimesha Claxton [facility]  [facility] [place] Population Health Community Resource Care Guide   [email or web address]  ?? [phone]   Website: [email or web address]  [email or web address]

## 2022-10-12 ENCOUNTER — Telehealth: Payer: Self-pay

## 2022-10-12 NOTE — Telephone Encounter (Signed)
Transition Care Management Unsuccessful Follow-up Telephone Call  Date of discharge and from where:  10/04/2022 [facility]  Attempts:  2nd Attempt  Reason for unsuccessful TCM follow-up call:  Unable to leave message  Famous Eisenhardt [facility]  [facility] Population Health Community Resource Care Guide   [email or web address]  ?? [phone]   Website: [email or web address]  [email or web address]

## 2022-11-10 ENCOUNTER — Telehealth: Payer: Self-pay | Admitting: *Deleted

## 2022-11-10 NOTE — Progress Notes (Signed)
  Care Coordination  Outreach Note  11/10/2022 Name: MAVI BILLIOT MRN: [id number] DOB: 1970-04-19   Care Coordination Outreach Attempts: An unsuccessful telephone outreach was attempted today to offer the patient information about available care coordination services.  Follow Up Plan:  Additional outreach attempts will be made to offer the patient care coordination information and services.   Encounter Outcome:  No Answer  Christie Nottingham  Care Coordination Care Guide  Direct Dial: [phone]

## 2022-11-20 NOTE — Progress Notes (Unsigned)
  Care Coordination  Outreach Note  11/20/2022 Name: TIARNA CEBALLOS MRN: [id number] DOB: [birth year]   Care Coordination Outreach Attempts: A second unsuccessful outreach was attempted today to offer the patient with information about available care coordination services.  Follow Up Plan:  Additional outreach attempts will be made to offer the patient care coordination information and services.   Encounter Outcome:  No Answer  Gwenevere Ghazi  Care Coordination Care Guide  Direct Dial: [phone]

## 2022-11-21 NOTE — Progress Notes (Signed)
  Care Coordination  Outreach Note  11/21/2022 Name: Kathryn [name] MRN: [id number] DOB: 01-Mar-1971   Care Coordination Outreach Attempts: A third unsuccessful outreach was attempted today to offer the patient with information about available care coordination services.  Follow Up Plan:  No further outreach attempts will be made at this time. We have been unable to contact the patient to offer or enroll patient in care coordination services  Encounter Outcome:  No Answer Phone number not working. No MyChart closing out.   [facility]  Care Coordination Care Guide  Direct Dial: [phone]

## 2022-12-11 ENCOUNTER — Emergency Department (HOSPITAL_COMMUNITY): Payer: Medicare HMO

## 2022-12-11 ENCOUNTER — Emergency Department (HOSPITAL_COMMUNITY)
Admission: EM | Admit: 2022-12-11 | Discharge: 2022-12-11 | Disposition: A | Discharge status: Home / Self Care | Payer: Medicare HMO

## 2022-12-11 ENCOUNTER — Encounter (HOSPITAL_COMMUNITY): Payer: Self-pay

## 2022-12-11 ENCOUNTER — Other Ambulatory Visit: Payer: Self-pay

## 2022-12-11 DIAGNOSIS — J441 Chronic obstructive pulmonary disease with (acute) exacerbation: Secondary | ICD-10-CM | POA: Insufficient documentation

## 2022-12-11 DIAGNOSIS — R0789 Other chest pain: Secondary | ICD-10-CM | POA: Diagnosis not present

## 2022-12-11 DIAGNOSIS — J45909 Unspecified asthma, uncomplicated: Secondary | ICD-10-CM | POA: Insufficient documentation

## 2022-12-11 DIAGNOSIS — I10 Essential (primary) hypertension: Secondary | ICD-10-CM | POA: Diagnosis not present

## 2022-12-11 DIAGNOSIS — R200 Anesthesia of skin: Secondary | ICD-10-CM | POA: Diagnosis not present

## 2022-12-11 DIAGNOSIS — Z7982 Long term (current) use of aspirin: Secondary | ICD-10-CM | POA: Insufficient documentation

## 2022-12-11 DIAGNOSIS — R531 Weakness: Secondary | ICD-10-CM | POA: Insufficient documentation

## 2022-12-11 DIAGNOSIS — Z20822 Contact with and (suspected) exposure to covid-19: Secondary | ICD-10-CM | POA: Insufficient documentation

## 2022-12-11 DIAGNOSIS — R202 Paresthesia of skin: Secondary | ICD-10-CM | POA: Diagnosis not present

## 2022-12-11 LAB — BASIC METABOLIC PANEL
Anion gap: 10 (ref 5–15)
BUN: 13 mg/dL (ref 6–20)
CO2: 25 mmol/L (ref 22–32)
Calcium: 9.4 mg/dL (ref 8.9–10.3)
Chloride: 103 mmol/L (ref 98–111)
Creatinine, Ser: 0.81 mg/dL (ref 0.44–1.00)
GFR, Estimated: >60 mL/min (ref >60)
Glucose, Bld: 104 mg/dL — ABNORMAL HIGH (ref 70–99)
Potassium: 3.3 mmol/L — ABNORMAL LOW (ref 3.5–5.1)
Sodium: 138 mmol/L (ref 135–145)

## 2022-12-11 LAB — CBC
HCT: 44.9 % (ref 36.0–46.0)
Hemoglobin: 14.7 g/dL (ref 12.0–15.0)
MCH: 29.1 pg (ref 26.0–34.0)
MCHC: 32.7 g/dL (ref 30.0–36.0)
MCV: 88.9 fL (ref 80.0–100.0)
Platelets: 732 10*3/uL — ABNORMAL HIGH (ref 150–400)
RBC: 5.05 MIL/uL (ref 3.87–5.11)
RDW: 16 % — ABNORMAL HIGH (ref 11.5–15.5)
WBC: 7.5 10*3/uL (ref 4.0–10.5)
nRBC: 0 % (ref 0.0–0.2)

## 2022-12-11 LAB — RESP PANEL BY RT-PCR (RSV, FLU A&B, COVID)  RVPGX2
Influenza A by PCR: NEGATIVE
Influenza B by PCR: NEGATIVE
Resp Syncytial Virus by PCR: NEGATIVE
SARS Coronavirus 2 by RT PCR: NEGATIVE

## 2022-12-11 LAB — TROPONIN I (HIGH SENSITIVITY)
Troponin I (High Sensitivity): 3 ng/L (ref <18)
Troponin I (High Sensitivity): 3 ng/L (ref <18)

## 2022-12-11 LAB — D-DIMER, QUANTITATIVE: D-Dimer, Quant: 0.41 ug{FEU}/mL (ref 0.00–0.50)

## 2022-12-11 MED ORDER — AZITHROMYCIN 250 MG PO TABS
500.0000 mg | ORAL_TABLET | Freq: Once | ORAL | Status: AC
  Administered 2022-12-11: 500 mg via ORAL
  Filled 2022-12-11: qty 2

## 2022-12-11 MED ORDER — METHYLPREDNISOLONE SODIUM SUCC 125 MG IJ SOLR
125.0000 mg | Freq: Once | INTRAMUSCULAR | Status: AC
  Administered 2022-12-11: 125 mg via INTRAVENOUS
  Filled 2022-12-11: qty 2

## 2022-12-11 MED ORDER — IOHEXOL 350 MG/ML SOLN
75.0000 mL | Freq: Once | INTRAVENOUS | Status: AC | PRN
  Administered 2022-12-11: 75 mL via INTRAVENOUS

## 2022-12-11 MED ORDER — AZITHROMYCIN 250 MG PO TABS: 250.0000 mg | ORAL_TABLET | Freq: Every day | ORAL | 0 refills | Status: DC

## 2022-12-11 MED ORDER — ONDANSETRON HCL 4 MG/2ML IJ SOLN
4.0000 mg | Freq: Once | INTRAMUSCULAR | Status: AC
  Administered 2022-12-11: 4 mg via INTRAVENOUS
  Filled 2022-12-11: qty 2

## 2022-12-11 MED ORDER — MORPHINE SULFATE (PF) 4 MG/ML IV SOLN
4.0000 mg | Freq: Once | INTRAVENOUS | Status: AC
  Administered 2022-12-11: 4 mg via INTRAVENOUS
  Filled 2022-12-11: qty 1

## 2022-12-11 MED ORDER — ASPIRIN 81 MG PO CHEW
81.0000 mg | CHEWABLE_TABLET | Freq: Once | ORAL | Status: AC
  Administered 2022-12-11: 81 mg via ORAL
  Filled 2022-12-11: qty 1

## 2022-12-11 MED ORDER — IPRATROPIUM-ALBUTEROL 0.5-2.5 (3) MG/3ML IN SOLN
3.0000 mL | Freq: Once | RESPIRATORY_TRACT | Status: AC
  Administered 2022-12-11: 3 mL via RESPIRATORY_TRACT
  Filled 2022-12-11: qty 3

## 2022-12-11 MED ORDER — ASPIRIN 81 MG PO CHEW: 81.0000 mg | CHEWABLE_TABLET | Freq: Every day | ORAL | 0 refills | Status: DC

## 2022-12-11 MED ORDER — PREDNISONE 50 MG PO TABS: ORAL_TABLET | ORAL | 0 refills | Status: DC

## 2022-12-11 MED ORDER — LORAZEPAM 2 MG/ML IJ SOLN
1.0000 mg | Freq: Once | INTRAMUSCULAR | Status: AC
  Administered 2022-12-11: 1 mg via INTRAVENOUS
  Filled 2022-12-11: qty 1

## 2022-12-11 MED ORDER — OXYCODONE HCL 5 MG PO TABS
5.0000 mg | ORAL_TABLET | Freq: Once | ORAL | Status: AC
  Administered 2022-12-11: 5 mg via ORAL
  Filled 2022-12-11: qty 1

## 2022-12-11 MED ORDER — ALBUTEROL SULFATE HFA 108 (90 BASE) MCG/ACT IN AERS
1.0000 | INHALATION_SPRAY | Freq: Four times a day (QID) | RESPIRATORY_TRACT | 0 refills | Status: AC | PRN
Start: 2022-12-11 — End: ?

## 2022-12-11 NOTE — Discharge Instructions (Signed)
Please note taking the aspirin daily and follow-up with your doctor to have your blood pressure rechecked and for further evaluation.  Please take the prednisone and azithromycin as prescribed.  Use the albuterol every 4 hours.  Return to the ER for worsening symptoms.

## 2022-12-11 NOTE — ED Provider Notes (Signed)
[place] EMERGENCY DEPARTMENT AT [facility] Provider Note   CSN: [id number] Arrival date & time: 12/11/22  0746     History  Chief Complaint  Patient presents with   Cough    Kathryn [name] is a 52 y.o. female.  52 year old female with past medical history of hypertension, COPD, and asthma presenting to the emergency department today with cough and chest discomfort.  The patient states she has been having pain in the center of her chest with coughing.  She denies any pain with deep breathing.  She states that this been going on now for the past week.  The patient reports that she has been coughing up a lot of yellow sputum.  She denies any exertional pain.  Denies any leg pain or swelling.  She reports that she has also been having some right-sided weakness/numbness.  She states that this has been going now for 1 week as well.  She denies any headaches with this.  She states that it has made walking somewhat difficult.  She reports that initially she was using her inhalers and this did seem to be helping but she has since run out.  She came to the emergency department today for further evaluation regarding this.   Cough      Home Medications Prior to Admission medications   Medication Sig Start Date End Date Taking? Authorizing Provider  albuterol (VENTOLIN HFA) 108 (90 Base) MCG/ACT inhaler Inhale 1-2 puffs into the lungs every 6 (six) hours as needed for wheezing or shortness of breath. 12/11/22  Yes Durwin Glaze, MD  aspirin 81 MG chewable tablet Chew 1 tablet (81 mg total) by mouth daily. 12/11/22  Yes Durwin Glaze, MD  azithromycin (ZITHROMAX) 250 MG tablet Take 1 tablet (250 mg total) by mouth daily. Take first 2 tablets together, then 1 every day until finished. 12/11/22  Yes Durwin Glaze, MD  predniSONE (DELTASONE) 50 MG tablet Take 1 tab daily 12/11/22  Yes Durwin Glaze, MD  albuterol (PROVENTIL) (2.5 MG/3ML) 0.083% nebulizer solution Take 3 mLs (2.5 mg total)  by nebulization every 6 (six) hours as needed for wheezing or shortness of breath. 06/27/22   [name], MD  amLODipine (NORVASC) 5 MG tablet Take 1 tablet (5 mg total) by mouth daily. 07/10/22 08/09/22  Massengill, Harrold Donath, MD  BREO ELLIPTA 200-25 MCG/ACT AEPB INHALE 1 PUFF EVERY DAY 03/06/22   Anabel Halon, MD  cholecalciferol (VITAMIN D3) 25 MCG (1000 UNIT) tablet Take 1,000 Units by mouth daily.    [provider]  DULoxetine (CYMBALTA) 60 MG capsule Take 1 capsule (60 mg total) by mouth daily. 07/11/22 08/10/22  Massengill, Harrold Donath, MD  hydrOXYzine (ATARAX) 50 MG tablet Take 2 tablets (100 mg total) by mouth every 6 (six) hours as needed for anxiety. 07/10/22   Massengill, Harrold Donath, MD  lidocaine (LIDODERM) 5 % Place 1 patch onto the skin daily. Remove & Discard patch within 12 hours or as directed by MD 07/10/22   Phineas Inches, MD  lurasidone (LATUDA) 20 MG TABS tablet Take 3 tablets (60 mg total) by mouth daily with breakfast. 07/11/22 08/10/22  Massengill, Harrold Donath, MD  nicotine (NICODERM CQ - DOSED IN MG/24 HOURS) 14 mg/24hr patch Place 1 patch (14 mg total) onto the skin daily. 07/11/22   Massengill, Harrold Donath, MD  oxyCODONE (ROXICODONE) 5 MG immediate release tablet Take 1 tablet (5 mg total) by mouth every 6 (six) hours as needed for severe pain. 10/04/22  Triplett, Tammy, PA-C  pantoprazole (PROTONIX) 40 MG tablet Take 1 tablet (40 mg total) by mouth daily. 01/14/22   [name], MD  phenazopyridine (PYRIDIUM) 200 MG tablet Take 1 tablet (200 mg total) by mouth 3 (three) times daily. 07/31/22   Gerhard Munch, MD  pregabalin (LYRICA) 150 MG capsule Take 1 capsule (150 mg total) by mouth 2 (two) times daily. 06/27/22   [name], MD  ramelteon (ROZEREM) 8 MG tablet Take 1 tablet (8 mg total) by mouth at bedtime. 07/10/22 08/09/22  Massengill, Harrold Donath, MD  tiZANidine (ZANAFLEX) 4 MG tablet Take 1 tablet (4 mg total) by mouth every 6 (six) hours as needed for muscle spasms. 10/04/22    Triplett, Tammy, PA-C      Allergies    Fish allergy, Flexeril [cyclobenzaprine hcl], Ibuprofen, Shellfish allergy, Tylenol [acetaminophen], Ace inhibitors, Peanut allergen powder-dnfp, Tramadol, and Trazodone and nefazodone    Review of Systems   Review of Systems  Respiratory:  Positive for cough.   All other systems reviewed and are negative. Gen: No fevers Eyes: No vision changes HEENT: no congestion, sore throat Neck: no neck stiffness Resp: no cough, shortness of breath Card: See HPI Abd: no nausea or vomiting, no abdominal pain Extremities: no leg swelling Neuro: See HPI Skin: no rashes   Physical Exam Updated Vital Signs BP (!) 180/97   Pulse 76   Temp 98 F (36.7 C) (Oral)   Resp 18   Ht 4\' 11"  (1.499 m)   Wt 61.2 kg   LMP 06/07/2020 Comment: CT showed evidence of uterus and cervix  SpO2 99%   BMI 27.27 kg/m  Physical Exam Gen: Coughing throughout interview, non-toxic Eyes: PERRL, EOMI HEENT: no oropharyngeal swelling Neck: trachea midline Resp: diffuse wheezing throughout all lung fields Card: RRR, no murmurs, rubs, or gallops Abd: nontender, nondistended Extremities: no calf tenderness, no edema Neuro: see NIH stroke scale Vascular: 2+ radial pulses bilaterally, 2+ DP pulses bilaterally Skin: no rashes Psyc: acting appropriately  ED Results / Procedures / Treatments   Labs (all labs ordered are listed, but only abnormal results are displayed) Labs Reviewed  BASIC METABOLIC PANEL - Abnormal; Notable for the following components:      Result Value   Potassium 3.3 (*)    Glucose, Bld 104 (*)    All other components within normal limits  CBC - Abnormal; Notable for the following components:   RDW 16.0 (*)    Platelets 732 (*)    All other components within normal limits  RESP PANEL BY RT-PCR (RSV, FLU A&B, COVID)  RVPGX2  D-DIMER, QUANTITATIVE  TROPONIN I (HIGH SENSITIVITY)  TROPONIN I (HIGH SENSITIVITY)    EKG EKG  Interpretation Date/Time:  Monday December 11 2022 08:30:16 EDT Ventricular Rate:  85 PR Interval:  152 QRS Duration:  92 QT Interval:  371 QTC Calculation: 442 R Axis:   86  Text Interpretation: Sinus rhythm Consider right atrial enlargement Borderline T abnormalities, anterior leads No STEMI, NSR, non-specific ST-T changes Confirmed by Beckey Downing [phone]) on 12/11/2022 9:03:21 AM  Radiology CT Angio Head Neck W WO CM  Result Date: 12/11/2022 CLINICAL DATA:  Neuro deficit, acute, stroke suspected R sided weakness EXAM: CT ANGIOGRAPHY HEAD AND NECK WITH AND WITHOUT CONTRAST TECHNIQUE: Multidetector CT imaging of the head and neck was performed using the standard protocol during bolus administration of intravenous contrast. Multiplanar CT image reconstructions and MIPs were obtained to evaluate the vascular anatomy. Carotid stenosis measurements (when applicable) are obtained utilizing  NASCET criteria, using the distal internal carotid diameter as the denominator. RADIATION DOSE REDUCTION: This exam was performed according to the departmental dose-optimization program which includes automated exposure control, adjustment of the mA and/or kV according to patient size and/or use of iterative reconstruction technique. CONTRAST:  75mL OMNIPAQUE IOHEXOL 350 MG/ML SOLN COMPARISON:  None Available. FINDINGS: CT HEAD FINDINGS Brain: No evidence of acute infarction, hemorrhage, hydrocephalus, extra-axial collection or mass lesion/mass effect. Vascular: No hyperdense vessel or unexpected calcification. Skull: Normal. Negative for fracture or focal lesion. Sinuses/Orbits: Small left mastoid effusion. No right mastoid or bilateral middle ear effusions. Paranasal sinuses are clear. Orbits are unremarkable. Other: None. Review of the MIP images confirms the above findings CTA NECK FINDINGS Aortic arch: Standard branching. Imaged portion shows no evidence of aneurysm or dissection. No significant stenosis of the major  arch vessel origins. Right carotid system: No evidence of dissection, stenosis (50% or greater), or occlusion. Left carotid system: No evidence of dissection, stenosis (50% or greater), or occlusion. Vertebral arteries: Codominant. No evidence of dissection, stenosis (50% or greater), or occlusion. Skeleton: Negative. Other neck: Negative. Upper chest: Negative. Review of the MIP images confirms the above findings CTA HEAD FINDINGS Anterior circulation: No significant stenosis, proximal occlusion, aneurysm, or vascular malformation. Posterior circulation: No significant stenosis, proximal occlusion, aneurysm, or vascular malformation. Venous sinuses: As permitted by contrast timing, patent. Anatomic variants: Fetal PCA on the right. Review of the MIP images confirms the above findings IMPRESSION: 1. No acute intracranial abnormality. 2. No intracranial large vessel occlusion or significant stenosis. 3. No hemodynamically significant stenosis in the neck. 4. Small left mastoid effusion. Electronically Signed   By: Lorenza Cambridge M.D.   On: 12/11/2022 15:40   DG Chest Port 1 View  Result Date: 12/11/2022 CLINICAL DATA:  Cough EXAM: PORTABLE CHEST 1 VIEW COMPARISON:  06/27/2022. FINDINGS: Bilateral lung fields are clear. Bilateral lateral costophrenic angles are clear. Normal cardio-mediastinal silhouette. No acute osseous abnormalities. The soft tissues are within normal limits. IMPRESSION: 1. No active disease. Electronically Signed   By: Jules Schick M.D.   On: 12/11/2022 09:54    Procedures Procedures    Medications Ordered in ED Medications  aspirin chewable tablet 81 mg (has no administration in time range)  ipratropium-albuterol (DUONEB) 0.5-2.5 (3) MG/3ML nebulizer solution 3 mL (3 mLs Nebulization Given 12/11/22 0911)  ipratropium-albuterol (DUONEB) 0.5-2.5 (3) MG/3ML nebulizer solution 3 mL (3 mLs Nebulization Given 12/11/22 0911)  ipratropium-albuterol (DUONEB) 0.5-2.5 (3) MG/3ML nebulizer  solution 3 mL (3 mLs Nebulization Given 12/11/22 0911)  LORazepam (ATIVAN) injection 1 mg (1 mg Intravenous Given 12/11/22 1013)  morphine (PF) 4 MG/ML injection 4 mg (4 mg Intravenous Given 12/11/22 1015)  ondansetron (ZOFRAN) injection 4 mg (4 mg Intravenous Given 12/11/22 1013)  methylPREDNISolone sodium succinate (SOLU-MEDROL) 125 mg/2 mL injection 125 mg (125 mg Intravenous Given 12/11/22 1014)  iohexol (OMNIPAQUE) 350 MG/ML injection 75 mL (75 mLs Intravenous Contrast Given 12/11/22 1438)  oxyCODONE (Oxy IR/ROXICODONE) immediate release tablet 5 mg (5 mg Oral Given 12/11/22 1615)  azithromycin (ZITHROMAX) tablet 500 mg (500 mg Oral Given 12/11/22 1615)    ED Course/ Medical Decision Making/ A&P                   NIH Stroke Scale: 2              Medical Decision Making 52 year old female past medical history of hypertension, hyperlipidemia, COPD, asthma, and hypertension presenting to the emergency department today with chest  discomfort with coughing with productive cough over the past week in addition to right-sided weakness and numbness.  I will further evaluate the patient here with basic labs well as an EKG, chest x-ray, and troponin for further evaluation for ACS, pulmonary edema, pulmonary infiltrates, pneumothorax.  Will also obtain a COVID and flu swab on the patient.  She is clearly wheezing here.  I will give the patient DuoNeb's here.  Will also give her steroids.  Given the right sided weakness this is very subtle here but she is reporting that this been going for the past week.  I will further evaluate the patient here with an MRI to evaluate for CVA.  She is clearly outside the window for neurointervention at this time.  I will reevaluate for ultimate disposition.  The patient's EKG interpreted by me shows a sinus rhythm with a rate of 85 with indeterminate axis, normal intervals, nonspecific ST-T changes.  The patient initial workup is unremarkable.  She refused MRI and after Ativan  here.  A CT scan of her head and CT angiogram are ordered for further evaluation.  I will discuss her case with neurology after her imaging studies are complete.  Speak with neurology they recommend starting the patient on aspirin.  The patient has an ibuprofen allergy but I did look back in her chart it does appear that she has been able to take this as recently as 2022.  The patient is given a dose here.  She will be discharged on aspirin and will be encouraged to follow-up with her primary care provider.  Amount and/or Complexity of Data Reviewed Labs: ordered. Radiology: ordered.  Risk OTC drugs. Prescription drug management.           Final Clinical Impression(s) / ED Diagnoses Final diagnoses:  COPD exacerbation (HCC)  Paresthesia    Rx / DC Orders ED Discharge Orders          Ordered    aspirin 81 MG chewable tablet  Daily        12/11/22 1616    predniSONE (DELTASONE) 50 MG tablet        12/11/22 1616    azithromycin (ZITHROMAX) 250 MG tablet  Daily        12/11/22 1616    albuterol (VENTOLIN HFA) 108 (90 Base) MCG/ACT inhaler  Every 6 hours PRN        12/11/22 1616              Durwin Glaze, MD 12/11/22 1616

## 2022-12-11 NOTE — ED Notes (Signed)
Ambulated pt across the hall. 02 stats was at 95%

## 2022-12-11 NOTE — ED Triage Notes (Signed)
Pt c/o of a productive cough, states she has been having fever and chills at home. States she is out of her breathing treatments. States she feels weak and has some dizziness. States she hasn't felt good in a week. Complains of weakness on the right side.

## 2022-12-11 NOTE — ED Notes (Signed)
Pt transported to MRI 

## 2022-12-22 ENCOUNTER — Telehealth: Payer: Self-pay

## 2022-12-22 NOTE — Telephone Encounter (Signed)
Transition Care Management Unsuccessful Follow-up Telephone Call  Date of discharge and from where:  12/11/2022 [facility]  Attempts:  1st Attempt  Reason for unsuccessful TCM follow-up call:  Unable to leave message  Lion Fernandez [facility]  [facility] Population Health Community Resource Care Guide   [email or web address]  ?? [phone]   Website: [email or web address]  [email or web address]

## 2022-12-25 ENCOUNTER — Telehealth: Payer: Self-pay

## 2022-12-25 NOTE — Telephone Encounter (Signed)
Transition Care Management Unsuccessful Follow-up Telephone Call  Date of discharge and from where:  12/11/2022 [facility]  Attempts:  2nd Attempt  Reason for unsuccessful TCM follow-up call:  Unable to leave message  Isobella Ascher [facility]  [facility] Population Health Community Resource Care Guide   [email or web address]  ?? [phone]   Website: [email or web address]  [email or web address]

## 2023-01-15 ENCOUNTER — Emergency Department (HOSPITAL_COMMUNITY)
Admission: EM | Admit: 2023-01-15 | Discharge: 2023-01-15 | Discharge status: Against Medical Advice | Payer: Medicare HMO | Attending: Emergency Medicine | Admitting: Emergency Medicine

## 2023-01-15 DIAGNOSIS — M79606 Pain in leg, unspecified: Secondary | ICD-10-CM | POA: Insufficient documentation

## 2023-01-15 DIAGNOSIS — Z5321 Procedure and treatment not carried out due to patient leaving prior to being seen by health care provider: Secondary | ICD-10-CM | POA: Insufficient documentation

## 2023-01-17 ENCOUNTER — Encounter (HOSPITAL_COMMUNITY): Payer: Self-pay

## 2023-01-17 ENCOUNTER — Emergency Department (HOSPITAL_COMMUNITY)
Admission: EM | Admit: 2023-01-17 | Discharge: 2023-01-17 | Disposition: A | Discharge status: Home / Self Care | Payer: Medicare HMO | Attending: Emergency Medicine | Admitting: Emergency Medicine

## 2023-01-17 ENCOUNTER — Other Ambulatory Visit: Payer: Self-pay

## 2023-01-17 DIAGNOSIS — M1711 Unilateral primary osteoarthritis, right knee: Secondary | ICD-10-CM | POA: Diagnosis not present

## 2023-01-17 DIAGNOSIS — Z91010 Allergy to peanuts: Secondary | ICD-10-CM | POA: Diagnosis not present

## 2023-01-17 DIAGNOSIS — M25461 Effusion, right knee: Secondary | ICD-10-CM | POA: Diagnosis not present

## 2023-01-17 DIAGNOSIS — Z7982 Long term (current) use of aspirin: Secondary | ICD-10-CM | POA: Diagnosis not present

## 2023-01-17 DIAGNOSIS — I10 Essential (primary) hypertension: Secondary | ICD-10-CM

## 2023-01-17 DIAGNOSIS — M48062 Spinal stenosis, lumbar region with neurogenic claudication: Secondary | ICD-10-CM

## 2023-01-17 MED ORDER — OXYCODONE HCL 5 MG PO TABS
5.0000 mg | ORAL_TABLET | ORAL | 0 refills | Status: DC | PRN
Start: 2023-01-17 — End: 2023-02-15

## 2023-01-17 MED ORDER — LIDOCAINE HCL (PF) 1 % IJ SOLN
5.0000 mL | Freq: Once | INTRAMUSCULAR | Status: AC
  Administered 2023-01-17: 5 mL
  Filled 2023-01-17: qty 5

## 2023-01-17 MED ORDER — ALBUTEROL SULFATE HFA 108 (90 BASE) MCG/ACT IN AERS: 1.0000 | INHALATION_SPRAY | Freq: Four times a day (QID) | RESPIRATORY_TRACT | 0 refills | Status: DC | PRN

## 2023-01-17 MED ORDER — TIZANIDINE HCL 4 MG PO TABS: 4.0000 mg | ORAL_TABLET | Freq: Four times a day (QID) | ORAL | 0 refills | Status: DC | PRN

## 2023-01-17 MED ORDER — FLUTICASONE FUROATE-VILANTEROL 200-25 MCG/ACT IN AEPB: 1.0000 | INHALATION_SPRAY | Freq: Every day | RESPIRATORY_TRACT | 10 refills | Status: DC

## 2023-01-17 MED ORDER — DULOXETINE HCL 60 MG PO CPEP: 60.0000 mg | ORAL_CAPSULE | Freq: Every day | ORAL | 0 refills | Status: DC

## 2023-01-17 MED ORDER — METHYLPREDNISOLONE 4 MG PO TBPK: ORAL_TABLET | ORAL | 0 refills | Status: DC

## 2023-01-17 MED ORDER — AMLODIPINE BESYLATE 5 MG PO TABS: 5.0000 mg | ORAL_TABLET | Freq: Every day | ORAL | 0 refills | Status: DC

## 2023-01-17 MED ORDER — PREGABALIN 150 MG PO CAPS: 150.0000 mg | ORAL_CAPSULE | Freq: Two times a day (BID) | ORAL | 0 refills | Status: DC

## 2023-01-17 NOTE — ED Triage Notes (Signed)
Pt complains of right knee pain. Pt had surgery on left knee in 2019 and states she needs surgery on right knee now. Pt states she does not have primary physician at this time.

## 2023-01-17 NOTE — Discharge Instructions (Addendum)
Follow-up with [facility] as planned.

## 2023-01-17 NOTE — ED Provider Notes (Signed)
[name] Provider Note   CSN: [id number] Arrival date & time: 01/17/23  0741     History  Chief Complaint  Patient presents with   Knee Pain    Right knee    Kathryn [name] is a 52 y.o. female.   Knee Pain Patient presents with acute on chronic right knee pain.  Reportedly needs a replacement.  Has had increased swelling.  No direct injury but states she did step a little while and have some increased pain and swelling.  Sees Delbert Harness for this.  States she is trying to get a new PCP.  Has had a cough but states it is because she is out of her albuterol.     Home Medications Prior to Admission medications   Medication Sig Start Date End Date Taking? Authorizing Provider  hydrOXYzine (ATARAX) 50 MG tablet Take 2 tablets (100 mg total) by mouth every 6 (six) hours as needed for anxiety. 07/10/22  Yes Massengill, Harrold Donath, MD  methylPREDNISolone (MEDROL DOSEPAK) 4 MG TBPK tablet Use per package directions. 01/17/23  Yes Benjiman Core, MD  oxyCODONE (ROXICODONE) 5 MG immediate release tablet Take 1 tablet (5 mg total) by mouth every 4 (four) hours as needed for severe pain. 01/17/23  Yes Benjiman Core, MD  albuterol (PROVENTIL) (2.5 MG/3ML) 0.083% nebulizer solution Take 3 mLs (2.5 mg total) by nebulization every 6 (six) hours as needed for wheezing or shortness of breath. Patient not taking: Reported on 01/17/2023 06/27/22   [name], MD  albuterol (VENTOLIN HFA) 108 (90 Base) MCG/ACT inhaler Inhale 1-2 puffs into the lungs every 6 (six) hours as needed for wheezing or shortness of breath. 01/17/23   Benjiman Core, MD  amLODipine (NORVASC) 5 MG tablet Take 1 tablet (5 mg total) by mouth daily. 01/17/23 02/16/23  Benjiman Core, MD  aspirin 81 MG chewable tablet Chew 1 tablet (81 mg total) by mouth daily. Patient not taking: Reported on 01/17/2023 12/11/22   Durwin Glaze, MD  cholecalciferol (VITAMIN D3) 25 MCG (1000 UNIT)  tablet Take 1,000 Units by mouth daily. Patient not taking: Reported on 01/17/2023    [provider]  DULoxetine (CYMBALTA) 60 MG capsule Take 1 capsule (60 mg total) by mouth daily. 01/17/23 02/16/23  Benjiman Core, MD  fluticasone furoate-vilanterol (BREO ELLIPTA) 200-25 MCG/ACT AEPB Inhale 1 puff into the lungs daily. 01/17/23   Benjiman Core, MD  lidocaine (LIDODERM) 5 % Place 1 patch onto the skin daily. Remove & Discard patch within 12 hours or as directed by MD Patient not taking: Reported on 01/17/2023 07/10/22   Phineas Inches, MD  lurasidone (LATUDA) 20 MG TABS tablet Take 3 tablets (60 mg total) by mouth daily with breakfast. Patient not taking: Reported on 01/17/2023 07/11/22 08/10/22  Phineas Inches, MD  pantoprazole (PROTONIX) 40 MG tablet Take 1 tablet (40 mg total) by mouth daily. Patient not taking: Reported on 01/17/2023 01/14/22   [name], MD  pregabalin (LYRICA) 150 MG capsule Take 1 capsule (150 mg total) by mouth 2 (two) times daily. 01/17/23   Benjiman Core, MD  ramelteon (ROZEREM) 8 MG tablet Take 1 tablet (8 mg total) by mouth at bedtime. Patient not taking: Reported on 01/17/2023 07/10/22 08/09/22  Phineas Inches, MD  tiZANidine (ZANAFLEX) 4 MG tablet Take 1 tablet (4 mg total) by mouth every 6 (six) hours as needed for muscle spasms. 01/17/23   Benjiman Core, MD      Allergies  Fish allergy, Flexeril [cyclobenzaprine hcl], Ibuprofen, Shellfish allergy, Tylenol [acetaminophen], Ace inhibitors, Peanut allergen powder-dnfp, Tramadol, and Trazodone and nefazodone    Review of Systems   Review of Systems  Physical Exam Updated Vital Signs BP (!) 149/96   Pulse 82   Temp 98.6 F (37 C) (Oral)   Resp 18   Ht 4\' 11"  (1.499 m)   Wt 61.2 kg   LMP 06/07/2020 Comment: CT showed evidence of uterus and cervix  SpO2 96%   BMI 27.27 kg/m  Physical Exam Vitals and nursing note reviewed.  Musculoskeletal:     Comments: Effusion right knee.   Some pain with movement.  No erythema.  Neurological:     Mental Status: She is alert.     ED Results / Procedures / Treatments   Labs (all labs ordered are listed, but only abnormal results are displayed) Labs Reviewed - No data to display  EKG None  Radiology No results found.  Procedures .Joint Aspiration/Arthrocentesis  Date/Time: 01/17/2023 8:40 AM  Performed by: Benjiman Core, MD Authorized by: Benjiman Core, MD   Consent:    Consent obtained:  Verbal   Consent given by:  Patient   Risks, benefits, and alternatives were discussed: yes     Risks discussed:  Pain, infection, bleeding, incomplete drainage and nerve damage   Alternatives discussed:  No treatment, delayed treatment and alternative treatment Location:    Location:  Knee   Knee:  R knee Anesthesia:    Anesthesia method:  Local infiltration   Local anesthetic:  Lidocaine 1% w/o epi Procedure details:    Preparation: Patient was prepped and draped in usual sterile fashion     Needle gauge:  18 G   Ultrasound guidance: yes     Approach:  Lateral   Aspirate characteristics:  Blood-tinged and yellow   Steroid injected: no     Specimen collected: no   Post-procedure details:    Dressing:  Adhesive bandage   Procedure completion:  Tolerated well, no immediate complications     Medications Ordered in ED Medications  lidocaine (PF) (XYLOCAINE) 1 % injection 5 mL (5 mLs Infiltration Given 01/17/23 0841)    ED Course/ Medical Decision Making/ A&P                                 Medical Decision Making Risk Prescription drug management.   Patient acute on chronic right knee pain.  Due to see Ortho.  Has had previous steroid injection.  States she is going to see Delbert Harness again. Discussed with patient and decided to do a therapeutic arthrocentesis.  Doubt infection.  Pulled off about 25 cc of fluid although it appears to be some left.  Requested crutches.  Refilled medicines and will give  short course of steroids and some pain medicines.        Final Clinical Impression(s) / ED Diagnoses Final diagnoses:  Effusion of right knee  Osteoarthritis of right knee, unspecified osteoarthritis type    Rx / DC Orders ED Discharge Orders          Ordered    albuterol (VENTOLIN HFA) 108 (90 Base) MCG/ACT inhaler  Every 6 hours PRN        01/17/23 0843    amLODipine (NORVASC) 5 MG tablet  Daily        01/17/23 0843    fluticasone furoate-vilanterol (BREO ELLIPTA) 200-25 MCG/ACT AEPB  Daily  01/17/23 0843    DULoxetine (CYMBALTA) 60 MG capsule  Daily        01/17/23 0843    pregabalin (LYRICA) 150 MG capsule  2 times daily        01/17/23 0843    tiZANidine (ZANAFLEX) 4 MG tablet  Every 6 hours PRN        01/17/23 0843    methylPREDNISolone (MEDROL DOSEPAK) 4 MG TBPK tablet        01/17/23 0843    oxyCODONE (ROXICODONE) 5 MG immediate release tablet  Every 4 hours PRN        01/17/23 0843              Benjiman Core, MD 01/17/23 [phone]

## 2023-01-26 DIAGNOSIS — F1410 Cocaine abuse, uncomplicated: Secondary | ICD-10-CM | POA: Diagnosis not present

## 2023-01-26 DIAGNOSIS — F1020 Alcohol dependence, uncomplicated: Secondary | ICD-10-CM | POA: Diagnosis not present

## 2023-01-29 DIAGNOSIS — F1420 Cocaine dependence, uncomplicated: Secondary | ICD-10-CM | POA: Diagnosis not present

## 2023-01-30 DIAGNOSIS — F1420 Cocaine dependence, uncomplicated: Secondary | ICD-10-CM | POA: Diagnosis not present

## 2023-01-31 DIAGNOSIS — F1420 Cocaine dependence, uncomplicated: Secondary | ICD-10-CM | POA: Diagnosis not present

## 2023-02-01 DIAGNOSIS — F1020 Alcohol dependence, uncomplicated: Secondary | ICD-10-CM | POA: Diagnosis not present

## 2023-02-03 DIAGNOSIS — F1420 Cocaine dependence, uncomplicated: Secondary | ICD-10-CM | POA: Diagnosis not present

## 2023-02-04 DIAGNOSIS — M7989 Other specified soft tissue disorders: Secondary | ICD-10-CM | POA: Diagnosis not present

## 2023-02-04 DIAGNOSIS — M79661 Pain in right lower leg: Secondary | ICD-10-CM | POA: Diagnosis not present

## 2023-02-04 DIAGNOSIS — M79672 Pain in left foot: Secondary | ICD-10-CM | POA: Diagnosis not present

## 2023-02-04 DIAGNOSIS — M25561 Pain in right knee: Secondary | ICD-10-CM | POA: Diagnosis not present

## 2023-02-04 DIAGNOSIS — G8929 Other chronic pain: Secondary | ICD-10-CM | POA: Diagnosis not present

## 2023-02-04 DIAGNOSIS — M79605 Pain in left leg: Secondary | ICD-10-CM | POA: Diagnosis not present

## 2023-02-04 DIAGNOSIS — M79604 Pain in right leg: Secondary | ICD-10-CM | POA: Diagnosis not present

## 2023-02-05 DIAGNOSIS — F1420 Cocaine dependence, uncomplicated: Secondary | ICD-10-CM | POA: Diagnosis not present

## 2023-02-07 DIAGNOSIS — F1420 Cocaine dependence, uncomplicated: Secondary | ICD-10-CM | POA: Diagnosis not present

## 2023-02-08 DIAGNOSIS — F1420 Cocaine dependence, uncomplicated: Secondary | ICD-10-CM | POA: Diagnosis not present

## 2023-02-09 DIAGNOSIS — F1420 Cocaine dependence, uncomplicated: Secondary | ICD-10-CM | POA: Diagnosis not present

## 2023-02-09 DIAGNOSIS — R079 Chest pain, unspecified: Secondary | ICD-10-CM | POA: Diagnosis not present

## 2023-02-09 DIAGNOSIS — R600 Localized edema: Secondary | ICD-10-CM | POA: Diagnosis not present

## 2023-02-09 DIAGNOSIS — R0789 Other chest pain: Secondary | ICD-10-CM | POA: Diagnosis not present

## 2023-02-15 ENCOUNTER — Emergency Department (HOSPITAL_COMMUNITY)
Admission: EM | Admit: 2023-02-15 | Discharge: 2023-02-15 | Disposition: A | Discharge status: Home / Self Care | Payer: Medicare HMO | Attending: Emergency Medicine | Admitting: Emergency Medicine

## 2023-02-15 ENCOUNTER — Other Ambulatory Visit: Payer: Self-pay

## 2023-02-15 ENCOUNTER — Encounter (HOSPITAL_COMMUNITY): Payer: Self-pay | Admitting: Emergency Medicine

## 2023-02-15 ENCOUNTER — Emergency Department (HOSPITAL_COMMUNITY): Payer: Medicare HMO

## 2023-02-15 DIAGNOSIS — M79671 Pain in right foot: Secondary | ICD-10-CM | POA: Diagnosis present

## 2023-02-15 DIAGNOSIS — R0602 Shortness of breath: Secondary | ICD-10-CM | POA: Diagnosis not present

## 2023-02-15 DIAGNOSIS — G629 Polyneuropathy, unspecified: Secondary | ICD-10-CM | POA: Insufficient documentation

## 2023-02-15 DIAGNOSIS — Z1152 Encounter for screening for COVID-19: Secondary | ICD-10-CM | POA: Insufficient documentation

## 2023-02-15 DIAGNOSIS — Z7982 Long term (current) use of aspirin: Secondary | ICD-10-CM | POA: Insufficient documentation

## 2023-02-15 LAB — BASIC METABOLIC PANEL
Anion gap: 12 (ref 5–15)
BUN: 15 mg/dL (ref 6–20)
CO2: 24 mmol/L (ref 22–32)
Calcium: 9.3 mg/dL (ref 8.9–10.3)
Chloride: 102 mmol/L (ref 98–111)
Creatinine, Ser: 0.63 mg/dL (ref 0.44–1.00)
GFR, Estimated: >60 mL/min (ref >60)
Glucose, Bld: 74 mg/dL (ref 70–99)
Potassium: 4 mmol/L (ref 3.5–5.1)
Sodium: 138 mmol/L (ref 135–145)

## 2023-02-15 LAB — RESP PANEL BY RT-PCR (RSV, FLU A&B, COVID)  RVPGX2
Influenza A by PCR: NEGATIVE
Influenza B by PCR: NEGATIVE
Resp Syncytial Virus by PCR: NEGATIVE
SARS Coronavirus 2 by RT PCR: NEGATIVE

## 2023-02-15 LAB — BRAIN NATRIURETIC PEPTIDE: B Natriuretic Peptide: 30 pg/mL (ref 0.0–100.0)

## 2023-02-15 LAB — TROPONIN I (HIGH SENSITIVITY): Troponin I (High Sensitivity): 4 ng/L (ref <18)

## 2023-02-15 MED ORDER — MORPHINE SULFATE (PF) 4 MG/ML IV SOLN
4.0000 mg | Freq: Once | INTRAVENOUS | Status: AC
  Administered 2023-02-15: 4 mg via INTRAVENOUS
  Filled 2023-02-15: qty 1

## 2023-02-15 MED ORDER — GABAPENTIN 300 MG PO CAPS: 300.0000 mg | ORAL_CAPSULE | Freq: Every day | ORAL | 0 refills | Status: DC

## 2023-02-15 MED ORDER — GABAPENTIN 100 MG PO CAPS
100.0000 mg | ORAL_CAPSULE | Freq: Once | ORAL | Status: AC
  Administered 2023-02-15: 100 mg via ORAL
  Filled 2023-02-15: qty 1

## 2023-02-15 MED ORDER — OXYCODONE HCL 5 MG PO TABS
5.0000 mg | ORAL_TABLET | Freq: Four times a day (QID) | ORAL | 0 refills | Status: AC | PRN
Start: 2023-02-15 — End: 2023-02-18

## 2023-02-15 MED ORDER — OXYCODONE HCL 5 MG PO TABS
10.0000 mg | ORAL_TABLET | Freq: Once | ORAL | Status: AC
  Administered 2023-02-15: 10 mg via ORAL
  Filled 2023-02-15: qty 2

## 2023-02-15 NOTE — Discharge Instructions (Addendum)
Please call and schedule an appointment with a new primary care physician.  In the meantime go to the wellness clinic provided in the emergency department for further management of your chronic polyneuropathy pain.

## 2023-02-15 NOTE — ED Provider Notes (Signed)
[place] EMERGENCY DEPARTMENT AT [facility] Provider Note   CSN: [id number] Arrival date & time: 02/15/23  1028     History  Chief Complaint  Patient presents with   Leg Pain    DEEANNA BEIGHTOL is a 52 y.o. female.  Patient is a 52 year old female with past medical history of polyneuralgia presenting for complaints of pain in bilateral feet. Originally stated she had sob in triage but told me "someone prayed over me while I was waiting and it went away".   Patient admits to bilateral foot pain on the plantar surface described as burning and sharp in nature.  History of polyneuropathy but states she does not take any of her prescribed medications because her primary care physician moved offices.  She has not yet gotten in with a new practitioner.  She denies any foot wounds or ulcers.  Denies any skin discoloration.  She admits to some shortness of breath and bilateral lower extremity swelling.    The history is provided by the patient. No language interpreter was used.  Leg Pain Associated symptoms: no back pain and no fever        Home Medications Prior to Admission medications   Medication Sig Start Date End Date Taking? Authorizing Provider  gabapentin (NEURONTIN) 300 MG capsule Take 1 capsule (300 mg total) by mouth daily. 02/15/23  Yes Edwin Dada P, DO  oxyCODONE (ROXICODONE) 5 MG immediate release tablet Take 1 tablet (5 mg total) by mouth every 6 (six) hours as needed for up to 3 days for severe pain (pain score 7-10). 02/15/23 02/18/23 Yes Edwin Dada P, DO  albuterol (PROVENTIL) (2.5 MG/3ML) 0.083% nebulizer solution Take 3 mLs (2.5 mg total) by nebulization every 6 (six) hours as needed for wheezing or shortness of breath. Patient not taking: Reported on 01/17/2023 06/27/22   [name], MD  albuterol (VENTOLIN HFA) 108 (90 Base) MCG/ACT inhaler Inhale 1-2 puffs into the lungs every 6 (six) hours as needed for wheezing or shortness of breath. 01/17/23    Benjiman Core, MD  amLODipine (NORVASC) 5 MG tablet Take 1 tablet (5 mg total) by mouth daily. 01/17/23 02/16/23  Benjiman Core, MD  aspirin 81 MG chewable tablet Chew 1 tablet (81 mg total) by mouth daily. Patient not taking: Reported on 01/17/2023 12/11/22   Durwin Glaze, MD  cholecalciferol (VITAMIN D3) 25 MCG (1000 UNIT) tablet Take 1,000 Units by mouth daily. Patient not taking: Reported on 01/17/2023    [provider]  DULoxetine (CYMBALTA) 60 MG capsule Take 1 capsule (60 mg total) by mouth daily. 01/17/23 02/16/23  Benjiman Core, MD  fluticasone furoate-vilanterol (BREO ELLIPTA) 200-25 MCG/ACT AEPB Inhale 1 puff into the lungs daily. 01/17/23   Benjiman Core, MD  hydrOXYzine (ATARAX) 50 MG tablet Take 2 tablets (100 mg total) by mouth every 6 (six) hours as needed for anxiety. 07/10/22   Massengill, Harrold Donath, MD  lidocaine (LIDODERM) 5 % Place 1 patch onto the skin daily. Remove & Discard patch within 12 hours or as directed by MD Patient not taking: Reported on 01/17/2023 07/10/22   Phineas Inches, MD  lurasidone (LATUDA) 20 MG TABS tablet Take 3 tablets (60 mg total) by mouth daily with breakfast. Patient not taking: Reported on 01/17/2023 07/11/22 08/10/22  Phineas Inches, MD  methylPREDNISolone (MEDROL DOSEPAK) 4 MG TBPK tablet Use per package directions. 01/17/23   Benjiman Core, MD  pantoprazole (PROTONIX) 40 MG tablet Take 1 tablet (40 mg total) by mouth daily. Patient  not taking: Reported on 01/17/2023 01/14/22   [name], MD  pregabalin (LYRICA) 150 MG capsule Take 1 capsule (150 mg total) by mouth 2 (two) times daily. 01/17/23   Benjiman Core, MD  ramelteon (ROZEREM) 8 MG tablet Take 1 tablet (8 mg total) by mouth at bedtime. Patient not taking: Reported on 01/17/2023 07/10/22 08/09/22  Phineas Inches, MD  tiZANidine (ZANAFLEX) 4 MG tablet Take 1 tablet (4 mg total) by mouth every 6 (six) hours as needed for muscle spasms. 01/17/23   Benjiman Core,  MD      Allergies    Fish allergy, Flexeril [cyclobenzaprine hcl], Ibuprofen, Shellfish allergy, Tylenol [acetaminophen], Ace inhibitors, Peanut allergen powder-dnfp, Tramadol, and Trazodone and nefazodone    Review of Systems   Review of Systems  Constitutional:  Negative for chills and fever.  HENT:  Negative for ear pain and sore throat.   Eyes:  Negative for pain and visual disturbance.  Respiratory:  Positive for shortness of breath. Negative for cough.   Cardiovascular:  Positive for leg swelling. Negative for chest pain and palpitations.  Gastrointestinal:  Negative for abdominal pain and vomiting.  Genitourinary:  Negative for dysuria and hematuria.  Musculoskeletal:  Negative for arthralgias and back pain.  Skin:  Negative for color change and rash.  Neurological:  Negative for seizures and syncope.  All other systems reviewed and are negative.   Physical Exam Updated Vital Signs BP (!) 150/139   Pulse 86   Temp 98.6 F (37 C)   Resp 17   Ht 4\' 11"  (1.499 m)   Wt 78 kg   LMP 06/07/2020 Comment: CT showed evidence of uterus and cervix  SpO2 98%   BMI 34.74 kg/m  Physical Exam Vitals and nursing note reviewed.  Constitutional:      General: She is not in acute distress.    Appearance: She is well-developed.  HENT:     Head: Normocephalic and atraumatic.  Eyes:     Conjunctiva/sclera: Conjunctivae normal.  Cardiovascular:     Rate and Rhythm: Normal rate and regular rhythm.     Heart sounds: No murmur heard. Pulmonary:     Effort: Pulmonary effort is normal. No respiratory distress.     Breath sounds: Normal breath sounds.  Abdominal:     Palpations: Abdomen is soft.     Tenderness: There is no abdominal tenderness.  Musculoskeletal:        General: No swelling.     Cervical back: Neck supple.  Skin:    General: Skin is warm and dry.     Capillary Refill: Capillary refill takes less than 2 seconds.  Neurological:     General: No focal deficit present.      Mental Status: She is alert and oriented to person, place, and time.     GCS: GCS eye subscore is 4. GCS verbal subscore is 5. GCS motor subscore is 6.     Cranial Nerves: Cranial nerves 2-12 are intact.     Sensory: Sensation is intact.     Motor: Motor function is intact.     Coordination: Coordination is intact.  Psychiatric:        Mood and Affect: Mood normal.     ED Results / Procedures / Treatments   Labs (all labs ordered are listed, but only abnormal results are displayed) Labs Reviewed  RESP PANEL BY RT-PCR (RSV, FLU A&B, COVID)  RVPGX2  BRAIN NATRIURETIC PEPTIDE  BASIC METABOLIC PANEL  CBC WITH DIFFERENTIAL/PLATELET  CBC  WITH DIFFERENTIAL/PLATELET  TROPONIN I (HIGH SENSITIVITY)    EKG None  Radiology DG Chest Portable 1 View  Result Date: 02/15/2023 CLINICAL DATA:  Shortness of breath. EXAM: PORTABLE CHEST 1 VIEW COMPARISON:  12/11/2022. FINDINGS: Bilateral lung fields are clear. Bilateral costophrenic angles are clear. Normal cardio-mediastinal silhouette. No acute osseous abnormalities. The soft tissues are within normal limits. IMPRESSION: *No active disease. Electronically Signed   By: Jules Schick M.D.   On: 02/15/2023 14:38    Procedures Procedures    Medications Ordered in ED Medications  oxyCODONE (Oxy IR/ROXICODONE) immediate release tablet 10 mg (10 mg Oral Given 02/15/23 1251)  gabapentin (NEURONTIN) capsule 100 mg (100 mg Oral Given 02/15/23 1251)  morphine (PF) 4 MG/ML injection 4 mg (4 mg Intravenous Given 02/15/23 1519)    ED Course/ Medical Decision Making/ A&P                                 Medical Decision Making Amount and/or Complexity of Data Reviewed Labs: ordered. Radiology: ordered.  Risk Prescription drug management.   53 year old female with past medical history of polyneuralgia presenting for complaints of pain in bilateral feet. Originally stated she had sob in triage but told me "someone prayed over me while I was  waiting and it went away". On exam patient is Aox3, no acute distress, afebrile, with stable vitals. She has equal bilateral breath sounds with no adventitious lung sounds. CXR clear with no pleural effusions. No pneumothorax. No focal opacities. No lower extremity edema on exam. Doubt acute CHF exacerbation. She is perc negative. Unlikely dvt. She admits to bilateral plantar pain that she states is her polyneuropathy. Patient's sensation and motor function is intact. Palpable DP pulses. No discoloration, wounds, erythema, or swelling. She is out of her home meds. Will write for < 3 day supply of norco and gabapentin and recommend getting into [facility] while awaiting to get in with a new pcp.         Final Clinical Impression(s) / ED Diagnoses Final diagnoses:  Polyneuropathy    Rx / DC Orders ED Discharge Orders          Ordered    oxyCODONE (ROXICODONE) 5 MG immediate release tablet  Every 6 hours PRN        02/15/23 1535    gabapentin (NEURONTIN) 300 MG capsule  Daily        02/15/23 1535              Edwin Dada [place], DO 02/16/23 2246

## 2023-02-15 NOTE — ED Triage Notes (Signed)
Pt c/o bilateral LE pain L side of foot hurts more than the right, since Monday Hx of neuropathy, both legs appear to be edematous with some pitting noted. Endorses CP when she breathes, states she also has sob. Rates pain 10/10, unsure if she has a Hx of heart problems

## 2023-02-23 ENCOUNTER — Emergency Department (HOSPITAL_COMMUNITY): Payer: Medicare HMO

## 2023-02-23 ENCOUNTER — Other Ambulatory Visit: Payer: Self-pay

## 2023-02-23 ENCOUNTER — Emergency Department (HOSPITAL_COMMUNITY)
Admission: EM | Admit: 2023-02-23 | Discharge: 2023-02-23 | Disposition: A | Discharge status: Home / Self Care | Payer: Medicare HMO | Attending: Student | Admitting: Student

## 2023-02-23 ENCOUNTER — Encounter (HOSPITAL_COMMUNITY): Payer: Self-pay | Admitting: Emergency Medicine

## 2023-02-23 DIAGNOSIS — M25562 Pain in left knee: Secondary | ICD-10-CM | POA: Diagnosis not present

## 2023-02-23 DIAGNOSIS — Z87891 Personal history of nicotine dependence: Secondary | ICD-10-CM | POA: Diagnosis not present

## 2023-02-23 DIAGNOSIS — M25561 Pain in right knee: Secondary | ICD-10-CM | POA: Diagnosis not present

## 2023-02-23 DIAGNOSIS — Z79899 Other long term (current) drug therapy: Secondary | ICD-10-CM | POA: Insufficient documentation

## 2023-02-23 DIAGNOSIS — M545 Low back pain, unspecified: Secondary | ICD-10-CM | POA: Diagnosis not present

## 2023-02-23 DIAGNOSIS — M25462 Effusion, left knee: Secondary | ICD-10-CM | POA: Diagnosis not present

## 2023-02-23 DIAGNOSIS — Z91010 Allergy to peanuts: Secondary | ICD-10-CM | POA: Diagnosis not present

## 2023-02-23 DIAGNOSIS — M25461 Effusion, right knee: Secondary | ICD-10-CM | POA: Diagnosis not present

## 2023-02-23 DIAGNOSIS — M48062 Spinal stenosis, lumbar region with neurogenic claudication: Secondary | ICD-10-CM

## 2023-02-23 DIAGNOSIS — Z471 Aftercare following joint replacement surgery: Secondary | ICD-10-CM | POA: Diagnosis not present

## 2023-02-23 DIAGNOSIS — I10 Essential (primary) hypertension: Secondary | ICD-10-CM | POA: Diagnosis not present

## 2023-02-23 DIAGNOSIS — W06XXXA Fall from bed, initial encounter: Secondary | ICD-10-CM | POA: Insufficient documentation

## 2023-02-23 DIAGNOSIS — J449 Chronic obstructive pulmonary disease, unspecified: Secondary | ICD-10-CM | POA: Insufficient documentation

## 2023-02-23 DIAGNOSIS — M1712 Unilateral primary osteoarthritis, left knee: Secondary | ICD-10-CM | POA: Diagnosis not present

## 2023-02-23 DIAGNOSIS — J45909 Unspecified asthma, uncomplicated: Secondary | ICD-10-CM | POA: Insufficient documentation

## 2023-02-23 DIAGNOSIS — W19XXXA Unspecified fall, initial encounter: Secondary | ICD-10-CM

## 2023-02-23 DIAGNOSIS — M1711 Unilateral primary osteoarthritis, right knee: Secondary | ICD-10-CM | POA: Diagnosis not present

## 2023-02-23 MED ORDER — HYDROCODONE-ACETAMINOPHEN 5-325 MG PO TABS: 1.0000 | ORAL_TABLET | Freq: Once | ORAL | Status: DC

## 2023-02-23 MED ORDER — OXYCODONE HCL 5 MG PO TABS
5.0000 mg | ORAL_TABLET | Freq: Once | ORAL | Status: AC
  Administered 2023-02-23: 5 mg via ORAL
  Filled 2023-02-23: qty 1

## 2023-02-23 MED ORDER — LURASIDONE HCL 20 MG PO TABS: 60.0000 mg | ORAL_TABLET | Freq: Every day | ORAL | 0 refills | Status: DC

## 2023-02-23 MED ORDER — OXYCODONE HCL 5 MG PO TABS: 5.0000 mg | ORAL_TABLET | Freq: Four times a day (QID) | ORAL | 0 refills | Status: DC | PRN

## 2023-02-23 MED ORDER — AMLODIPINE BESYLATE 5 MG PO TABS: 5.0000 mg | ORAL_TABLET | Freq: Every day | ORAL | 0 refills | Status: DC

## 2023-02-23 MED ORDER — TIZANIDINE HCL 4 MG PO TABS: 4.0000 mg | ORAL_TABLET | Freq: Four times a day (QID) | ORAL | 0 refills | Status: DC | PRN

## 2023-02-23 MED ORDER — MORPHINE SULFATE (PF) 4 MG/ML IV SOLN
4.0000 mg | Freq: Once | INTRAVENOUS | Status: AC
  Administered 2023-02-23: 4 mg via INTRAMUSCULAR
  Filled 2023-02-23: qty 1

## 2023-02-23 MED ORDER — ALBUTEROL SULFATE HFA 108 (90 BASE) MCG/ACT IN AERS
1.0000 | INHALATION_SPRAY | Freq: Four times a day (QID) | RESPIRATORY_TRACT | 0 refills | Status: DC | PRN
Start: 2023-02-23 — End: 2023-03-12

## 2023-02-23 MED ORDER — PREGABALIN 150 MG PO CAPS: 150.0000 mg | ORAL_CAPSULE | Freq: Two times a day (BID) | ORAL | 0 refills | Status: DC

## 2023-02-23 MED ORDER — LIDOCAINE 5 % EX PTCH
1.0000 | MEDICATED_PATCH | CUTANEOUS | Status: DC
  Administered 2023-02-23: 1 via TRANSDERMAL
  Filled 2023-02-23: qty 1

## 2023-02-23 MED ORDER — GABAPENTIN 300 MG PO CAPS
300.0000 mg | ORAL_CAPSULE | Freq: Once | ORAL | Status: AC
  Administered 2023-02-23: 300 mg via ORAL
  Filled 2023-02-23: qty 1

## 2023-02-23 MED ORDER — DULOXETINE HCL 60 MG PO CPEP: 60.0000 mg | ORAL_CAPSULE | Freq: Every day | ORAL | 0 refills | Status: DC
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
## 2023-02-23 NOTE — ED Triage Notes (Signed)
Pt c/o right knee since Monday. Pt states fell on knee this morning. C/o chronic back pain that intensified with fall. Pt ambulatory with cane.

## 2023-02-23 NOTE — ED Notes (Signed)
Pt has medication bottles in pocket

## 2023-03-01 ENCOUNTER — Other Ambulatory Visit: Payer: Self-pay

## 2023-03-01 ENCOUNTER — Encounter (HOSPITAL_COMMUNITY): Payer: Self-pay | Admitting: *Deleted

## 2023-03-01 ENCOUNTER — Emergency Department (HOSPITAL_COMMUNITY)
Admission: EM | Admit: 2023-03-01 | Discharge: 2023-03-03 | Disposition: A | Discharge status: Other Acute Inpatient Hospital | Payer: Medicare HMO | Attending: Emergency Medicine | Admitting: Emergency Medicine

## 2023-03-01 DIAGNOSIS — M545 Low back pain, unspecified: Secondary | ICD-10-CM | POA: Insufficient documentation

## 2023-03-01 DIAGNOSIS — I10 Essential (primary) hypertension: Secondary | ICD-10-CM | POA: Insufficient documentation

## 2023-03-01 DIAGNOSIS — Z79899 Other long term (current) drug therapy: Secondary | ICD-10-CM | POA: Insufficient documentation

## 2023-03-01 DIAGNOSIS — F250 Schizoaffective disorder, bipolar type: Secondary | ICD-10-CM | POA: Diagnosis not present

## 2023-03-01 DIAGNOSIS — Z7982 Long term (current) use of aspirin: Secondary | ICD-10-CM | POA: Diagnosis not present

## 2023-03-01 DIAGNOSIS — E876 Hypokalemia: Secondary | ICD-10-CM | POA: Diagnosis not present

## 2023-03-01 DIAGNOSIS — F4310 Post-traumatic stress disorder, unspecified: Secondary | ICD-10-CM | POA: Insufficient documentation

## 2023-03-01 DIAGNOSIS — Z91010 Allergy to peanuts: Secondary | ICD-10-CM | POA: Insufficient documentation

## 2023-03-01 DIAGNOSIS — G8929 Other chronic pain: Secondary | ICD-10-CM | POA: Insufficient documentation

## 2023-03-01 DIAGNOSIS — F1410 Cocaine abuse, uncomplicated: Secondary | ICD-10-CM | POA: Diagnosis not present

## 2023-03-01 DIAGNOSIS — R45851 Suicidal ideations: Secondary | ICD-10-CM | POA: Diagnosis not present

## 2023-03-01 DIAGNOSIS — F1910 Other psychoactive substance abuse, uncomplicated: Secondary | ICD-10-CM | POA: Diagnosis not present

## 2023-03-01 DIAGNOSIS — F259 Schizoaffective disorder, unspecified: Secondary | ICD-10-CM

## 2023-03-01 LAB — CBC WITH DIFFERENTIAL/PLATELET
Abs Immature Granulocytes: 0.01 10*3/uL (ref 0.00–0.07)
Basophils Absolute: 0 10*3/uL (ref 0.0–0.1)
Basophils Relative: 1 %
Eosinophils Absolute: 0.2 10*3/uL (ref 0.0–0.5)
Eosinophils Relative: 2 %
HCT: 39.1 % (ref 36.0–46.0)
Hemoglobin: 13.2 g/dL (ref 12.0–15.0)
Immature Granulocytes: 0 %
Lymphocytes Relative: 46 %
Lymphs Abs: 3.2 10*3/uL (ref 0.7–4.0)
MCH: 30.1 pg (ref 26.0–34.0)
MCHC: 33.8 g/dL (ref 30.0–36.0)
MCV: 89.1 fL (ref 80.0–100.0)
Monocytes Absolute: 0.4 10*3/uL (ref 0.1–1.0)
Monocytes Relative: 6 %
Neutro Abs: 3.1 10*3/uL (ref 1.7–7.7)
Neutrophils Relative %: 45 %
Platelets: 645 10*3/uL — ABNORMAL HIGH (ref 150–400)
RBC: 4.39 MIL/uL (ref 3.87–5.11)
RDW: 15 % (ref 11.5–15.5)
WBC: 6.9 10*3/uL (ref 4.0–10.5)
nRBC: 0 % (ref 0.0–0.2)

## 2023-03-01 LAB — COMPREHENSIVE METABOLIC PANEL
ALT: 19 U/L (ref 0–44)
AST: 23 U/L (ref 15–41)
Albumin: 3.7 g/dL (ref 3.5–5.0)
Alkaline Phosphatase: 89 U/L (ref 38–126)
Anion gap: 9 (ref 5–15)
BUN: 11 mg/dL (ref 6–20)
CO2: 24 mmol/L (ref 22–32)
Calcium: 9.1 mg/dL (ref 8.9–10.3)
Chloride: 104 mmol/L (ref 98–111)
Creatinine, Ser: 0.71 mg/dL (ref 0.44–1.00)
GFR, Estimated: >60 mL/min (ref >60)
Glucose, Bld: 98 mg/dL (ref 70–99)
Potassium: 3.2 mmol/L — ABNORMAL LOW (ref 3.5–5.1)
Sodium: 137 mmol/L (ref 135–145)
Total Bilirubin: 0.3 mg/dL (ref <1.2)
Total Protein: 7.1 g/dL (ref 6.5–8.1)

## 2023-03-01 LAB — ETHANOL: Alcohol, Ethyl (B): <10 mg/dL (ref <10)

## 2023-03-01 MED ORDER — POTASSIUM CHLORIDE CRYS ER 20 MEQ PO TBCR
40.0000 meq | EXTENDED_RELEASE_TABLET | Freq: Once | ORAL | Status: AC
  Administered 2023-03-01: 40 meq via ORAL
  Filled 2023-03-01: qty 2

## 2023-03-01 MED ORDER — LIDOCAINE 5 % EX PTCH
1.0000 | MEDICATED_PATCH | CUTANEOUS | Status: DC
  Administered 2023-03-01 – 2023-03-02 (×2): 1 via TRANSDERMAL
  Filled 2023-03-01 (×2): qty 1

## 2023-03-01 NOTE — ED Provider Notes (Signed)
[place] EMERGENCY DEPARTMENT AT [facility] Provider Note   CSN: [id number] Arrival date & time: 03/01/23  1448     History  Chief Complaint  Patient presents with  . Back Pain  . V70.1    Kathryn [name] is a 52 y.o. female.  PMH of hypertension, lumbar stenosis, schizoaffective disorder bipolar type, polysubstance abuse.  Presents the ER today complaining of chronic low back pain, states she has no new pain or change in her symptoms but she does not have any medications at home.  She states she came in today to get medication for her back and to "go to rehab for cocaine detox", last used cocaine last night, also reports that she has been feeling suicidal with a plan to cut her wrists. Patient notes she has not been taking her psychiatric medications, cannot tell me when she last took them.  Patient [place] any saddle anesthesia or paresthesia, no bowel or bladder incontinence, no lower back swelling, no fevers or chills =   Back Pain      Home Medications Prior to Admission medications   Medication Sig Start Date End Date Taking? Authorizing Provider  amLODipine (NORVASC) 10 MG tablet Take 10 mg by mouth daily. 02/07/23  Yes [provider]  albuterol (PROVENTIL) (2.5 MG/3ML) 0.083% nebulizer solution Take 3 mLs (2.5 mg total) by nebulization every 6 (six) hours as needed for wheezing or shortness of breath. Patient not taking: Reported on 01/17/2023 06/27/22   [name], MD  albuterol (VENTOLIN HFA) 108 (90 Base) MCG/ACT inhaler Inhale 1-2 puffs into the lungs every 6 (six) hours as needed for wheezing or shortness of breath. 02/23/23   Kommor, Madison, MD  amLODipine (NORVASC) 5 MG tablet Take 1 tablet (5 mg total) by mouth daily. 02/23/23 03/25/23  Kommor, Wyn Forster, MD  aspirin 81 MG chewable tablet Chew 1 tablet (81 mg total) by mouth daily. Patient not taking: Reported on 01/17/2023 12/11/22   Durwin Glaze, MD  cholecalciferol (VITAMIN D3) 25 MCG (1000  UNIT) tablet Take 1,000 Units by mouth daily. Patient not taking: Reported on 01/17/2023    [provider]  DULoxetine (CYMBALTA) 60 MG capsule Take 1 capsule (60 mg total) by mouth daily. 02/23/23 03/25/23  Kommor, Madison, MD  fluticasone furoate-vilanterol (BREO ELLIPTA) 200-25 MCG/ACT AEPB Inhale 1 puff into the lungs daily. 01/17/23   Benjiman Core, MD  gabapentin (NEURONTIN) 300 MG capsule Take 1 capsule (300 mg total) by mouth daily. 02/15/23   Franne Forts, DO  hydrOXYzine (ATARAX) 50 MG tablet Take 2 tablets (100 mg total) by mouth every 6 (six) hours as needed for anxiety. 07/10/22   Massengill, Harrold Donath, MD  lidocaine (LIDODERM) 5 % Place 1 patch onto the skin daily. Remove & Discard patch within 12 hours or as directed by MD Patient not taking: Reported on 01/17/2023 07/10/22   Phineas Inches, MD  Lurasidone HCl 60 MG TABS Take 60 mg by mouth daily with breakfast. 02/23/23   [provider]  methocarbamol (ROBAXIN) 500 MG tablet Take 500 mg by mouth 2 (two) times daily as needed for muscle spasms. 02/04/23   [provider]  methylPREDNISolone (MEDROL DOSEPAK) 4 MG TBPK tablet Use per package directions. 01/17/23   Benjiman Core, MD  mirtazapine (REMERON) 15 MG tablet Take 15 mg by mouth at bedtime. 02/07/23   [provider]  omeprazole (PRILOSEC) 20 MG capsule Take 20 mg by mouth daily before breakfast. 02/07/23   [provider]  oxyCODONE (ROXICODONE) 5 MG immediate release tablet Take 1 tablet (5 mg total) by mouth every 6 (six) hours as needed for breakthrough pain. 02/23/23   Kommor, Madison, MD  pantoprazole (PROTONIX) 40 MG tablet Take 1 tablet (40 mg total) by mouth daily. Patient not taking: Reported on 01/17/2023 01/14/22   [name], MD  pregabalin (LYRICA) 150 MG capsule Take 1 capsule (150 mg total) by mouth 2 (two) times daily. 02/23/23   Kommor, Madison, MD  ramelteon (ROZEREM) 8 MG tablet Take 1 tablet (8 mg total) by  mouth at bedtime. Patient not taking: Reported on 01/17/2023 07/10/22 08/09/22  Phineas Inches, MD  tiZANidine (ZANAFLEX) 2 MG tablet Take 4 mg by mouth every 6 (six) hours as needed for muscle spasms. 02/23/23   [provider]      Allergies    Fish allergy, Flexeril [cyclobenzaprine hcl], Ibuprofen, Seroquel [quetiapine fumarate], Shellfish allergy, Tylenol [acetaminophen], Tramadol, Ace inhibitors, Peanut allergen powder-dnfp, and Trazodone and nefazodone    Review of Systems   Review of Systems  Musculoskeletal:  Positive for back pain.    Physical Exam Updated Vital Signs BP (!) 153/84   Pulse 74   Temp 98.1 F (36.7 C) (Oral)   Resp 18   LMP 06/07/2020 Comment: CT showed evidence of uterus and cervix  SpO2 100%  Physical Exam Vitals and nursing note reviewed.  Constitutional:      General: She is not in acute distress.    Appearance: She is well-developed.  HENT:     Head: Normocephalic and atraumatic.  Eyes:     Conjunctiva/sclera: Conjunctivae normal.  Cardiovascular:     Rate and Rhythm: Normal rate and regular rhythm.     Heart sounds: No murmur heard. Pulmonary:     Effort: Pulmonary effort is normal. No respiratory distress.     Breath sounds: Normal breath sounds.  Abdominal:     Palpations: Abdomen is soft.     Tenderness: There is no abdominal tenderness.  Musculoskeletal:        General: No swelling.     Cervical back: Normal and neck supple.     Thoracic back: Normal.     Lumbar back: Tenderness present. No swelling or edema. Negative right straight leg raise test and negative left straight leg raise test.  Skin:    General: Skin is warm and dry.     Capillary Refill: Capillary refill takes less than 2 seconds.  Neurological:     General: No focal deficit present.     Mental Status: She is alert and oriented to person, place, and time.  Psychiatric:        Mood and Affect: Mood normal.    ED Results / Procedures / Treatments    Labs (all labs ordered are listed, but only abnormal results are displayed) Labs Reviewed - No data to display  EKG None  Radiology No results found.  Procedures Procedures    Medications Ordered in ED Medications - No data to display  ED Course/ Medical Decision Making/ A&P                                 Medical Decision Making This patient presents to the ED for concern of back pain this involves an extensive number of treatment options, and is a complaint that carries with it a high risk of complications and morbidity.  The differential diagnosis includes sprain, strain, HNP, fracture,  DDD, muscle spasm, cauda equina, epidural abscess or hematoma, malignancy, other   Co morbidities that complicate the patient evaluation  Schizoaffective disorder bipolar type, substance use disorder   Additional history obtained:  Additional history obtained from EMR External records from outside source obtained and reviewed including notes   Lab Tests:  I Ordered, and personally interpreted labs.  The pertinent results include: Alcohol level is normal, less than 10 mg/dL, CMP shows mild hypokalemia, otherwise normal, CBC normal, urine and urine drug screen are pending      Problem List / ED Course / Critical interventions / Medication management  Patient presents with low back pain that is chronic in nature, denies injury or change in symptoms. Differential considered as above. Symptoms are likely due to chronic back pain. They have no high risk features including saddle anesthesia/paresthesia, bowel or bladder incontinence, urinary retention, fever, weight loss, history of cancer or immune suppression, or IV drug use. We discussed plan to treat symptoms with Lidoderm, will consider oxycodone given her allergies if not improving.  She is also reporting that she has been abusing cocaine, not taking her psychiatric meds and feels suicidal with a plan to cut her wrists and bleed to  death.  Will have her evaluated by mental health, she is currently voluntary, asking for help, though primarily is sleeping though answers questions, she will respond to verbal stimulation. Urine drug screen is pending, potassium repleted, patient sleeping, given her report of SI will await TTS evaluation I have reviewed the patients home medicines and have made adjustments as needed     Amount and/or Complexity of Data Reviewed Labs: ordered.  Risk Prescription drug management.            Final Clinical Impression(s) / ED Diagnoses Final diagnoses:  None    Rx / DC Orders ED Discharge Orders     None         Josem Kaufmann 03/01/23 1955    Vanetta Mulders, MD 03/10/23 0008

## 2023-03-01 NOTE — ED Notes (Signed)
Pt has been wanded by security in triage

## 2023-03-01 NOTE — ED Triage Notes (Addendum)
Pt with lower back pain. Also requesting rehab to detox from cocaine, last used Wednesday. Pt also c/o pain to left toe pain from long toenail. Pt admits to SI, plan to "cut her wrist and bleed out"

## 2023-03-01 NOTE — ED Notes (Addendum)
Pt given heated up dinner tray & [place] sandwich per request. Pt having difficulty staying awake. VSS

## 2023-03-01 NOTE — ED Notes (Signed)
Pt states she is homeless and not able to sleep well.  Pt drowsy in triage, became tearful when she said is homeless.

## 2023-03-02 ENCOUNTER — Encounter (HOSPITAL_COMMUNITY): Payer: Self-pay | Admitting: Psychiatry

## 2023-03-02 DIAGNOSIS — R45851 Suicidal ideations: Secondary | ICD-10-CM | POA: Diagnosis not present

## 2023-03-02 DIAGNOSIS — F250 Schizoaffective disorder, bipolar type: Secondary | ICD-10-CM | POA: Diagnosis not present

## 2023-03-02 DIAGNOSIS — E876 Hypokalemia: Secondary | ICD-10-CM | POA: Diagnosis not present

## 2023-03-02 DIAGNOSIS — F1410 Cocaine abuse, uncomplicated: Secondary | ICD-10-CM | POA: Diagnosis not present

## 2023-03-02 DIAGNOSIS — R9431 Abnormal electrocardiogram [ECG] [EKG]: Secondary | ICD-10-CM | POA: Diagnosis not present

## 2023-03-02 DIAGNOSIS — F1910 Other psychoactive substance abuse, uncomplicated: Secondary | ICD-10-CM | POA: Diagnosis not present

## 2023-03-02 LAB — RAPID URINE DRUG SCREEN, HOSP PERFORMED
Amphetamines: NOT DETECTED
Barbiturates: NOT DETECTED
Benzodiazepines: NOT DETECTED
Cocaine: POSITIVE — AB
Opiates: NOT DETECTED
Tetrahydrocannabinol: NOT DETECTED

## 2023-03-02 MED ORDER — POTASSIUM CHLORIDE CRYS ER 20 MEQ PO TBCR
40.0000 meq | EXTENDED_RELEASE_TABLET | Freq: Once | ORAL | Status: AC
  Administered 2023-03-02: 40 meq via ORAL
  Filled 2023-03-02: qty 2

## 2023-03-02 MED ORDER — GABAPENTIN 300 MG PO CAPS
300.0000 mg | ORAL_CAPSULE | Freq: Every day | ORAL | Status: DC
  Administered 2023-03-02: 300 mg via ORAL
  Filled 2023-03-02: qty 1

## 2023-03-02 MED ORDER — DULOXETINE HCL 30 MG PO CPEP
60.0000 mg | ORAL_CAPSULE | Freq: Every day | ORAL | Status: DC
Start: 2023-03-02 — End: 2023-03-03
  Administered 2023-03-02: 60 mg via ORAL
  Filled 2023-03-02: qty 2

## 2023-03-02 NOTE — BH Assessment (Signed)
Per IRIS coordinator Maralyn Sago Paddack), "Hello, we have attempted to see this pt but she is not waking up. please reach out at a later time when she is awake to reschedule."    Redmond Pulling, MS, [facility], [facility] Triage Specialist [phone]

## 2023-03-02 NOTE — ED Notes (Signed)
Pt is very sleepy and lethargic per night shift RN was the same during the night. When trying to wake pt she mumbles and states she has pain. Pt soiled and cleaned I & O cath done for a urine sample. EDP notified.

## 2023-03-02 NOTE — ED Provider Notes (Signed)
Emergency Medicine Observation Re-evaluation Note  Kathryn [name] is a 52 y.o. female, seen on rounds today.  Pt initially presented to the ED for complaints of Back Pain and V70.1 Currently, the patient is sleeping.  Physical Exam  BP 136/88 (BP Location: Right Arm)   Pulse 73   Temp 98.3 F (36.8 C) (Oral)   Resp 12   LMP 06/07/2020 Comment: CT showed evidence of uterus and cervix  SpO2 99%  Physical Exam General: No distress Cardiac: Regular rate and rhythm Lungs: No increased work of breathing Psych: Calm  ED Course / MDM  EKG:EKG Interpretation Date/Time:  Friday March 02 2023 07:17:02 EST Ventricular Rate:  72 PR Interval:  147 QRS Duration:  93 QT Interval:  413 QTC Calculation: 452 R Axis:   69  Text Interpretation: Sinus rhythm Borderline T wave abnormalities Confirmed by Gerhard Munch [id number]) on 03/02/2023 7:25:56 AM  I have reviewed the labs performed to date as well as medications administered while in observation.  Recent changes in the last 24 hours include none.  Patient awaiting medication reconciliation given her multiple medical issues including chronic pain.  After my evaluation patient awakened, requested pain medication.  Plan  Current plan is for placement.    Gerhard Munch, MD 03/02/23 [phone]
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
## 2023-03-02 NOTE — ED Provider Notes (Signed)
This patient has been accepted at behavioral health, slight hypokalemia, I have ordered replacement, otherwise patient is hemodynamically stable and medically cleared to be transferred to behavioral health.  I spoke with the [facility], she is agreeable   Eber Hong, MD 03/02/23 2318

## 2023-03-03 ENCOUNTER — Other Ambulatory Visit: Payer: Self-pay

## 2023-03-03 ENCOUNTER — Inpatient Hospital Stay
Admission: AD | Admit: 2023-03-03 | Discharge: 2023-03-12 | DRG: 885 | Disposition: A | Discharge status: Rehabilitation Facility | Payer: Medicare HMO | Source: Intra-hospital | Attending: Psychiatry | Admitting: Psychiatry

## 2023-03-03 ENCOUNTER — Encounter: Payer: Self-pay | Admitting: Psychiatry

## 2023-03-03 DIAGNOSIS — Z886 Allergy status to analgesic agent status: Secondary | ICD-10-CM | POA: Diagnosis not present

## 2023-03-03 DIAGNOSIS — F250 Schizoaffective disorder, bipolar type: Secondary | ICD-10-CM | POA: Diagnosis not present

## 2023-03-03 DIAGNOSIS — Z91013 Allergy to seafood: Secondary | ICD-10-CM | POA: Diagnosis not present

## 2023-03-03 DIAGNOSIS — Z5941 Food insecurity: Secondary | ICD-10-CM | POA: Diagnosis not present

## 2023-03-03 DIAGNOSIS — M1990 Unspecified osteoarthritis, unspecified site: Secondary | ICD-10-CM | POA: Diagnosis not present

## 2023-03-03 DIAGNOSIS — K219 Gastro-esophageal reflux disease without esophagitis: Secondary | ICD-10-CM | POA: Diagnosis present

## 2023-03-03 DIAGNOSIS — F1910 Other psychoactive substance abuse, uncomplicated: Principal | ICD-10-CM | POA: Diagnosis present

## 2023-03-03 DIAGNOSIS — Z5948 Other specified lack of adequate food: Secondary | ICD-10-CM

## 2023-03-03 DIAGNOSIS — Z5982 Transportation insecurity: Secondary | ICD-10-CM

## 2023-03-03 DIAGNOSIS — Z885 Allergy status to narcotic agent status: Secondary | ICD-10-CM

## 2023-03-03 DIAGNOSIS — F419 Anxiety disorder, unspecified: Secondary | ICD-10-CM | POA: Diagnosis not present

## 2023-03-03 DIAGNOSIS — Z79899 Other long term (current) drug therapy: Secondary | ICD-10-CM

## 2023-03-03 DIAGNOSIS — G4730 Sleep apnea, unspecified: Secondary | ICD-10-CM | POA: Diagnosis present

## 2023-03-03 DIAGNOSIS — R45851 Suicidal ideations: Secondary | ICD-10-CM | POA: Diagnosis not present

## 2023-03-03 DIAGNOSIS — F332 Major depressive disorder, recurrent severe without psychotic features: Secondary | ICD-10-CM | POA: Diagnosis not present

## 2023-03-03 DIAGNOSIS — Z87891 Personal history of nicotine dependence: Secondary | ICD-10-CM | POA: Diagnosis not present

## 2023-03-03 DIAGNOSIS — Z91010 Allergy to peanuts: Secondary | ICD-10-CM | POA: Diagnosis not present

## 2023-03-03 DIAGNOSIS — Z888 Allergy status to other drugs, medicaments and biological substances status: Secondary | ICD-10-CM | POA: Diagnosis not present

## 2023-03-03 DIAGNOSIS — Z7951 Long term (current) use of inhaled steroids: Secondary | ICD-10-CM | POA: Diagnosis not present

## 2023-03-03 DIAGNOSIS — F1410 Cocaine abuse, uncomplicated: Secondary | ICD-10-CM | POA: Diagnosis not present

## 2023-03-03 DIAGNOSIS — F259 Schizoaffective disorder, unspecified: Secondary | ICD-10-CM | POA: Diagnosis present

## 2023-03-03 DIAGNOSIS — J4489 Other specified chronic obstructive pulmonary disease: Secondary | ICD-10-CM | POA: Diagnosis present

## 2023-03-03 DIAGNOSIS — I10 Essential (primary) hypertension: Secondary | ICD-10-CM | POA: Diagnosis present

## 2023-03-03 MED ORDER — LORAZEPAM 2 MG/ML IJ SOLN: 2.0000 mg | Freq: Three times a day (TID) | INTRAMUSCULAR | Status: DC | PRN

## 2023-03-03 MED ORDER — DULOXETINE HCL 30 MG PO CPEP
60.0000 mg | ORAL_CAPSULE | Freq: Every day | ORAL | Status: DC
Start: 2023-03-03 — End: 2023-03-03
  Administered 2023-03-03: 60 mg via ORAL
  Filled 2023-03-03: qty 2

## 2023-03-03 MED ORDER — AMLODIPINE BESYLATE 5 MG PO TABS
5.0000 mg | ORAL_TABLET | Freq: Every day | ORAL | Status: DC
  Administered 2023-03-03 – 2023-03-12 (×10): 5 mg via ORAL
  Filled 2023-03-03 (×10): qty 1

## 2023-03-03 MED ORDER — GABAPENTIN 300 MG PO CAPS: 300.0000 mg | ORAL_CAPSULE | Freq: Every day | ORAL | Status: DC

## 2023-03-03 MED ORDER — NICOTINE POLACRILEX 2 MG MT GUM
2.0000 mg | CHEWING_GUM | OROMUCOSAL | Status: DC | PRN
  Filled 2023-03-03: qty 1

## 2023-03-03 MED ORDER — GABAPENTIN 300 MG PO CAPS
300.0000 mg | ORAL_CAPSULE | Freq: Every day | ORAL | Status: DC
  Administered 2023-03-03 – 2023-03-06 (×4): 300 mg via ORAL
  Filled 2023-03-03 (×4): qty 1

## 2023-03-03 MED ORDER — DIPHENHYDRAMINE HCL 50 MG/ML IJ SOLN: 50.0000 mg | Freq: Three times a day (TID) | INTRAMUSCULAR | Status: DC | PRN

## 2023-03-03 MED ORDER — HALOPERIDOL LACTATE 5 MG/ML IJ SOLN: 5.0000 mg | Freq: Three times a day (TID) | INTRAMUSCULAR | Status: DC | PRN

## 2023-03-03 MED ORDER — ALUM & MAG HYDROXIDE-SIMETH 200-200-20 MG/5ML PO SUSP
30.0000 mL | ORAL | Status: DC | PRN
  Administered 2023-03-05: 30 mL via ORAL
  Filled 2023-03-03: qty 30

## 2023-03-03 MED ORDER — ASPIRIN 81 MG PO CHEW
81.0000 mg | CHEWABLE_TABLET | Freq: Every day | ORAL | Status: DC
  Administered 2023-03-03 – 2023-03-12 (×10): 81 mg via ORAL
  Filled 2023-03-03 (×10): qty 1

## 2023-03-03 MED ORDER — DULOXETINE HCL 30 MG PO CPEP
90.0000 mg | ORAL_CAPSULE | Freq: Every day | ORAL | Status: DC
Start: 2023-03-04 — End: 2023-03-12
  Administered 2023-03-04 – 2023-03-12 (×9): 90 mg via ORAL
  Filled 2023-03-03 (×9): qty 3

## 2023-03-03 MED ORDER — LIDOCAINE 5 % EX PTCH
1.0000 | MEDICATED_PATCH | CUTANEOUS | Status: DC
  Administered 2023-03-03 – 2023-03-11 (×9): 1 via TRANSDERMAL
  Filled 2023-03-03 (×9): qty 1

## 2023-03-03 MED ORDER — MAGNESIUM HYDROXIDE 400 MG/5ML PO SUSP: 30.0000 mL | Freq: Every day | ORAL | Status: DC | PRN

## 2023-03-03 MED ORDER — ALUM & MAG HYDROXIDE-SIMETH 200-200-20 MG/5ML PO SUSP: 30.0000 mL | ORAL | Status: DC | PRN

## 2023-03-03 MED ORDER — ONDANSETRON 4 MG PO TBDP: 4.0000 mg | ORAL_TABLET | Freq: Four times a day (QID) | ORAL | Status: AC | PRN

## 2023-03-03 MED ORDER — PREGABALIN 75 MG PO CAPS
150.0000 mg | ORAL_CAPSULE | Freq: Two times a day (BID) | ORAL | Status: DC
  Administered 2023-03-03 – 2023-03-12 (×19): 150 mg via ORAL
  Filled 2023-03-03 (×20): qty 2

## 2023-03-03 MED ORDER — TIZANIDINE HCL 4 MG PO TABS
4.0000 mg | ORAL_TABLET | Freq: Four times a day (QID) | ORAL | Status: DC | PRN
  Administered 2023-03-03 – 2023-03-10 (×11): 4 mg via ORAL
  Filled 2023-03-03 (×12): qty 1

## 2023-03-03 MED ORDER — OLANZAPINE 5 MG PO TBDP
5.0000 mg | ORAL_TABLET | Freq: Three times a day (TID) | ORAL | Status: DC | PRN
  Administered 2023-03-06: 5 mg via ORAL
  Filled 2023-03-03: qty 1

## 2023-03-03 NOTE — [facility] Suicide Risk Assessment (Addendum)
[facility] Admission Suicide Risk Assessment   Nursing information obtained from:  Patient Demographic factors:  Age 52 or older Current Mental Status:  Suicidal ideation indicated by patient Loss Factors:  Decline in physical health Historical Factors:  Family history of suicide Risk Reduction Factors:  NA  Total Time spent with patient: 2 hours Principal Problem: Polysubstance abuse (HCC) Diagnosis:  Principal Problem:   Polysubstance abuse (HCC) Active Problems:   Schizoaffective disorder (HCC)  Subjective Data: 51 year old African American female admitted from the emergency department for chronic low back pain, suicidal ideation, and polysubstance abuse. She reports no new or worsening back pain but states she has no medications at home to manage her symptoms. She last used cocaine the night before admission and expresses a desire to "go to rehab for cocaine detox." The patient endorses worsening depression over the past two months and active suicidal ideation with a plan to cut her wrists. She admits to not taking her psychiatric medications and cannot recall when she last adhered to a consistent regimen. She also reports feeling tearful and hopeless, describing her need to "get off cocaine, get help for depression, and get off the streets."She has been homeless for the past three years, intermittently staying with her daughter. However, she cannot stay with her daughter now due to issues with her daughter's boyfriend, whom she perceives as problematic. The patient feels unable to work due to back pain from previous surgery and relies on SSDI for financial support.The patient admits to self-medicating with cocaine and reports longstanding challenges with substance abuse. \ Continued Clinical Symptoms:  Alcohol Use Disorder Identification Test Final Score (AUDIT): 22 The "Alcohol Use Disorders Identification Test", Guidelines for Use in Primary Care, Second Edition.  World Science writer  [facility]). Score between 0-7:  no or low risk or alcohol related problems. Score between 8-15:  moderate risk of alcohol related problems. Score between 16-19:  high risk of alcohol related problems. Score 20 or above:  warrants further diagnostic evaluation for alcohol dependence and treatment.   CLINICAL FACTORS:   Depression:   Comorbid alcohol abuse/dependence Hopelessness Alcohol/Substance Abuse/Dependencies Chronic Pain Previous Psychiatric Diagnoses and Treatments Medical Diagnoses and Treatments/Surgeries   Musculoskeletal: Strength & Muscle Tone: within normal limits Gait & Station: normal Patient leans: N/A  Psychiatric Specialty Exam:  Presentation  General Appearance:  Appropriate for Environment; Casual; Fairly Groomed  Eye Contact: Good  Speech: Normal Rate; Clear and Coherent  Speech Volume: Normal  Handedness: Right   Mood and Affect  Mood: Euthymic; Anxious  Affect: Appropriate; Congruent; Full Range   Thought Process  Thought Processes: Linear  Descriptions of Associations:Intact  Orientation:Full (Time, Place and Person)  Thought Content:Logical  History of Schizophrenia/Schizoaffective disorder:Yes  Duration of Psychotic Symptoms:N/A  Hallucinations:No data recorded Ideas of Reference:None  Suicidal Thoughts:No data recorded Homicidal Thoughts:No data recorded  Sensorium  Memory: Immediate Good; Recent Good; Remote Good  Judgment: Fair  Insight: Fair   Art therapist  Concentration: Fair  Attention Span: Fair  Recall: Good  Fund of Knowledge: Good  Language: Good   Psychomotor Activity  Psychomotor Activity:No data recorded  Assets  Assets: Communication Skills; Desire for Improvement; Financial Resources/Insurance; Physical Health; Resilience; Social Support   Sleep  Sleep:No data recorded   Physical Exam: Physical Exam Vitals and nursing note reviewed.  Constitutional:       Appearance: Normal appearance.  HENT:     Head: Normocephalic and atraumatic.     Nose: Nose normal.  Pulmonary:  Effort: Pulmonary effort is normal.  Musculoskeletal:     Cervical back: Normal range of motion.     Right lower leg: Tenderness present. 1+ Edema present.     Left lower leg: Tenderness present. 1+ Edema present.  Neurological:     General: No focal deficit present.     Mental Status: She is alert and oriented to person, place, and time.  Psychiatric:        Attention and Perception: Attention and perception normal.        Mood and Affect: Mood is anxious. Affect is flat.        Speech: Speech normal.        Behavior: Behavior is withdrawn. Behavior is cooperative.        Thought Content: Thought content includes suicidal ideation.        Cognition and Memory: Cognition and memory normal.        Judgment: Judgment normal.     Comments: "Cut my wrists"    ROS Blood pressure (!) 162/96, pulse 70, temperature 98 F (36.7 C), temperature source Temporal, resp. rate 16, height 4\' 11"  (1.499 m), weight 73.9 kg, last menstrual period 06/07/2020, SpO2 98%. Body mass index is 32.92 kg/m.   COGNITIVE FEATURES THAT CONTRIBUTE TO RISK:  None    SUICIDE RISK:   Mild:  Suicidal ideation of limited frequency, intensity, duration, and specificity.  There are no identifiable plans, no associated intent, mild dysphoria and related symptoms, good self-control (both objective and subjective assessment), few other risk factors, and identifiable protective factors, including available and accessible social support.  PLAN OF CARE:  Maintain suicide precautions with 15-minute checks and regular therapeutic interactions to monitor emotional state. Duloxetine (Cymbalta) 90 mg daily Mood stabilization, neuropathic pain relief, and management of depression. Pregabalin (Lyrica) 150 mg BID Neuropathic pain management and anxiety reduction. Gabapentin (Neurontin) 300 mg daily Adjunctive  therapy for neuropathic pain and mood stabilization. Amlodipine (Norvasc) 5 mg daily Hypertension management.  Aspirin 81 mg daily Cardiovascular health and prevention of thrombotic events. Lidocaine patches 5% daily (1 patch) Localized pain management for chronic back pain. As-Needed (PRN) Medications: Tizanidine (Zanaflex) 4 mg Q6H PRN Muscle spasms and relief of back pain. I certify that inpatient services furnished can reasonably be expected to improve the patient's condition.   Myriam Forehand, NP 03/03/2023, 5:21 PM

## 2023-03-03 NOTE — Treatment Plan (Cosign Needed)
Patient will benefit from ongoing skilled PT services in skilled nursing facility setting to continue to advance safe functional mobility, address ongoing impairments in ,  and minimize fall risk.

## 2023-03-03 NOTE — H&P (Signed)
Psychiatric Admission Assessment Adult  Patient Identification: Kathryn [name] MRN:  [id number] Date of Evaluation:  03/03/2023 Chief Complaint:  Polysubstance abuse (HCC) [F19.10] Schizoaffective disorder (HCC) [F25.9] Principal Diagnosis: Polysubstance abuse (HCC) Diagnosis:  Principal Problem:   Polysubstance abuse (HCC) Active Problems:   Schizoaffective disorder (HCC)  History of Present Illness: 52 year old African American female with a history of hypertension, lumbar stenosis, schizoaffective disorder, bipolar type, and polysubstance abuse, admitted from the emergency department for evaluation of worsening mental health and substance use. She presents with chronic low back pain, which she states is unchanged and without new symptoms. The patient reports that she has not been taking her prescribed medications, including psychiatric medications, and cannot recall when she last took them.The patient describes worsening depression over the past two months, citing homelessness and strained relationships as contributing factors. She has been homeless for the past three years, intermittently staying with her daughter, though she is currently unable to do so due to conflicts with her daughter's boyfriend. She expresses feelings of helplessness, stating, "I need help to get off cocaine, get help for depression, and get off the streets."She endorses active suicidal ideation with a plan to cut her wrists and struggles to contract for safety. She reports last using cocaine the night before admission and expresses a desire to enter a rehabilitation program for cocaine detox.The patient was initially groggy and disengaged during the interview but became more cooperative and tearful when discussing her need for treatment and the impact of her current circumstances. She admits to self-medicating with cocaine and has a history of multiple past inpatient psychiatric admissions for suicidal ideation and attempts,  though she cannot recall specific dates.She receives SSDI for her mental health and physical health issues related to back surgery but reports significant distress due to her inability to work or secure stable housing. The patient agrees with the recommendation for inpatient psychiatric treatment and acknowledges the possibility of transitioning to residential substance abuse treatment upon stabilization. Associated Signs/Symptoms: Depression Symptoms:  depressed mood, insomnia, fatigue, feelings of worthlessness/guilt, hopelessness, recurrent thoughts of death, suicidal thoughts with specific plan, loss of energy/fatigue, disturbed sleep, (Hypo) Manic Symptoms:  Labiality of Mood, Anxiety Symptoms:  Excessive Worry, Psychotic Symptoms:   none PTSD Symptoms: Negative Total Time spent with patient: 2 hours  Past Psychiatric History: Anxiety Depression Polysubstance Abuse  Is the patient at risk to self? No.  Has the patient been a risk to self in the past 6 months? No.  Has the patient been a risk to self within the distant past? No.  Is the patient a risk to others? No.  Has the patient been a risk to others in the past 6 months? No.  Has the patient been a risk to others within the distant past? No.   [place] Scale:  Flowsheet Row Admission (Current) from 03/03/2023 in [facility] INPATIENT BEHAVIORAL MEDICINE ED from 03/01/2023 in [facility] Emergency Department at [facility] ED from 02/23/2023 in [facility] Emergency Department at [facility]  C-SSRS RISK CATEGORY High Risk High Risk No Risk        Prior Inpatient Therapy: Yes.   If yes, describe [facility]  Prior Outpatient Therapy: Yes.   If yes, describe [facility]   Alcohol Screening: Patient refused Alcohol Screening Tool: Yes 1. How often do you have a drink containing alcohol?: 4 or more times a week 2. How many drinks containing alcohol do you have on a typical day when you are drinking?: 5 or  6 3. How often do you  have six or more drinks on one occasion?: Daily or almost daily AUDIT-C Score: 10 4. How often during the last year have you found that you were not able to stop drinking once you had started?: Daily or almost daily 5. How often during the last year have you failed to do what was normally expected from you because of drinking?: Daily or almost daily 6. How often during the last year have you needed a first drink in the morning to get yourself going after a heavy drinking session?: Daily or almost daily 7. How often during the last year have you had a feeling of guilt of remorse after drinking?: Never 8. How often during the last year have you been unable to remember what happened the night before because you had been drinking?: Never 9. Have you or someone else been injured as a result of your drinking?: No 10. Has a relative or friend or a doctor or another health worker been concerned about your drinking or suggested you cut down?: No Alcohol Use Disorder Identification Test Final Score (AUDIT): 22 Substance Abuse History in the last 12 months:  Yes.   Consequences of Substance Abuse: Medical Consequences:  difficulty walking heart problems Family Consequences:  unable to live with family members Previous Psychotropic Medications: Yes  Psychological Evaluations: Yes  Past Medical History:  Past Medical History:  Diagnosis Date   Anxiety    Arthritis    Asthma    Bipolar disorder (HCC)    Chronic back pain    COPD (chronic obstructive pulmonary disease) (HCC)    Chronic bronchitis   Depression    Dyspnea    GERD (gastroesophageal reflux disease)    Hypertension    Migraine    Neuropathy    Osteoarthritis of left knee, 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   Single subsegmental pulmonary embolism without acute cor pulmonale (HCC) 06/20/2021   Sleep apnea 04/2020   GETTING A cpap   Suicidal ideation 12/24/2018    Past Surgical History:  Procedure Laterality Date   BACK SURGERY      DILATATION AND CURETTAGE/HYSTEROSCOPY WITH MINERVA N/A 06/09/2020   Procedure: DILATATION AND CURETTAGE/HYSTEROSCOPY WITH MINERVA;  Surgeon: Lazaro Arms, MD;  Location: [facility] ORS;  Service: Gynecology;  Laterality: N/A;   ESOPHAGOGASTRODUODENOSCOPY (EGD) WITH PROPOFOL N/A 12/25/2018   Procedure: ESOPHAGOGASTRODUODENOSCOPY (EGD) WITH PROPOFOL;  Surgeon: Malissa Hippo, MD;  Location: [facility] ENDO SUITE;  Service: Endoscopy;  Laterality: N/A;   FLEXIBLE SIGMOIDOSCOPY  10/14/2021   Procedure: FLEXIBLE SIGMOIDOSCOPY;  Surgeon: Dolores Frame, MD;  Location: [facility] ENDO SUITE;  Service: Gastroenterology;;   PATELLA-FEMORAL ARTHROPLASTY Left 10/08/2018   Procedure: PATELLA-FEMORAL ARTHROPLASTY;  Surgeon: Teryl Lucy, MD;  Location: [facility] ORS;  Service: Orthopedics;  Laterality: Left;   TUBAL LIGATION     Family History:  Family History  Problem Relation Age of Onset   Gout Paternal Grandfather    Cirrhosis Paternal Grandfather    Hypertension Paternal Grandmother    Aneurysm Paternal Grandmother    Cirrhosis Maternal Grandmother    Cirrhosis Maternal Grandfather    Cancer Father    Cirrhosis Father    Cirrhosis Mother    Breast cancer Sister    Hypertension Sister    Bronchitis Daughter    Bronchitis Daughter    Asthma Son    Bronchitis Son    Migraines Neg Hx    Family Psychiatric  History: none reported Tobacco Screening:  Social History   Tobacco Use  Smoking Status Former   Current packs/day: 0.00   Average packs/day: 0.3 packs/day for 10.0 years (2.5 ttl pk-yrs)   Types: Cigarettes   Start date: 04/2010   Quit date: 04/2020   Years since quitting: 2.8   Passive exposure: Never  Smokeless Tobacco Never  Tobacco Comments   Smokes often, especially when using crack cocaine    BH Tobacco Counseling     Are you interested in Tobacco Cessation Medications?  Yes, implement Nicotene Replacement Protocol Counseled patient on smoking cessation:  Yes Reason Tobacco Screening  Not Completed: No value filed.       Social History:  Social History   Substance and Sexual Activity  Alcohol Use Not Currently   Comment: states quit  06/29/22     Social History   Substance and Sexual Activity  Drug Use Yes   Types: Cocaine   Comment: last used 05/2022    Additional Social History:                           Allergies:   Allergies  Allergen Reactions   Fish Allergy Anaphylaxis, Shortness Of Breath and Swelling   Flexeril [Cyclobenzaprine Hcl] Shortness Of Breath   Ibuprofen Anaphylaxis, Hives, Other (See Comments), Cough and Rash   Seroquel [Quetiapine Fumarate] Anaphylaxis    Swelling of tongue and mouth   Shellfish Allergy Anaphylaxis   Tylenol [Acetaminophen] Anaphylaxis   Tramadol Nausea And Vomiting    Upset stomach   Ace Inhibitors Cough   Peanut Allergen Powder-Dnfp    Trazodone And Nefazodone Hives   Lab Results:  Results for orders placed or performed during the hospital encounter of 03/01/23 (from the past 48 hour(s))  Urine rapid drug screen (hosp performed)     Status: Abnormal   Collection Time: 03/02/23  7:46 AM  Result Value Ref Range   Opiates NONE DETECTED NONE DETECTED   Cocaine POSITIVE (A) NONE DETECTED   Benzodiazepines NONE DETECTED NONE DETECTED   Amphetamines NONE DETECTED NONE DETECTED   Tetrahydrocannabinol NONE DETECTED NONE DETECTED   Barbiturates NONE DETECTED NONE DETECTED    Comment: (NOTE) DRUG SCREEN FOR MEDICAL PURPOSES ONLY.  IF CONFIRMATION IS NEEDED FOR ANY PURPOSE, NOTIFY LAB WITHIN 5 DAYS.  LOWEST DETECTABLE LIMITS FOR URINE DRUG SCREEN Drug Class                     Cutoff (ng/mL) Amphetamine and metabolites    1000 Barbiturate and metabolites    200 Benzodiazepine                 200 Opiates and metabolites        300 Cocaine and metabolites        300 THC                            50 Performed at [facility], [address], [place], [place] [address]     Blood Alcohol level:   Lab Results  Component Value Date   [facility] <10 03/01/2023   ETH <10 06/29/2022    Metabolic Disorder Labs:  Lab Results  Component Value Date   HGBA1C 5.5 01/10/2022   MPG 111.15 01/10/2022   MPG 111.15 10/19/2021   No results found for: "PROLACTIN" Lab Results  Component Value Date   CHOL 168 01/10/2022   TRIG 56 01/10/2022   HDL 57 01/10/2022   CHOLHDL 2.9  01/10/2022   VLDL 11 01/10/2022   LDLCALC 100 (H) 01/10/2022   LDLCALC 84 10/19/2021    Current Medications: Current Facility-Administered Medications  Medication Dose Route Frequency Provider Last Rate Last Admin   alum & mag hydroxide-simeth (MAALOX/MYLANTA) 200-200-20 MG/5ML suspension 30 mL  30 mL Oral Q4H PRN Bobbitt, Shalon E, NP       amLODipine (NORVASC) tablet 5 mg  5 mg Oral Daily Durwin Nora, Rashaun M, NP   5 mg at 03/03/23 1006   aspirin chewable tablet 81 mg  81 mg Oral Daily Dixon, Rashaun M, NP   81 mg at 03/03/23 1006   haloperidol lactate (HALDOL) injection 5 mg  5 mg Intramuscular TID PRN Bobbitt, Shalon E, NP       And   diphenhydrAMINE (BENADRYL) injection 50 mg  50 mg Intramuscular TID PRN Bobbitt, Shalon E, NP       And   LORazepam (ATIVAN) injection 2 mg  2 mg Intramuscular TID PRN Bobbitt, Shalon E, NP       [START ON 03/04/2023] DULoxetine (CYMBALTA) DR capsule 90 mg  90 mg Oral Daily Myriam Forehand, NP       gabapentin (NEURONTIN) capsule 300 mg  300 mg Oral Daily Dixon, Rashaun M, NP   300 mg at 03/03/23 1005   lidocaine (LIDODERM) 5 % 1 patch  1 patch Transdermal Q24H Dixon, Rashaun M, NP       magnesium hydroxide (MILK OF MAGNESIA) suspension 30 mL  30 mL Oral Daily PRN Bobbitt, Shalon E, NP       nicotine polacrilex (NICORETTE) gum 2 mg  2 mg Oral PRN Myriam Forehand, NP       OLANZapine zydis (ZYPREXA) disintegrating tablet 5 mg  5 mg Oral TID PRN Jearld Lesch, NP       ondansetron (ZOFRAN-ODT) disintegrating tablet 4 mg  4 mg Oral Q6H PRN Bobbitt, Shalon E, NP       pregabalin (LYRICA) capsule  150 mg  150 mg Oral BID Durwin Nora, Rashaun M, NP   150 mg at 03/03/23 1005   tiZANidine (ZANAFLEX) tablet 4 mg  4 mg Oral Q6H PRN Jearld Lesch, NP       PTA Medications: Medications Prior to Admission  Medication Sig Dispense Refill Last Dose   albuterol (PROVENTIL) (2.5 MG/3ML) 0.083% nebulizer solution Take 3 mLs (2.5 mg total) by nebulization every 6 (six) hours as needed for wheezing or shortness of breath. (Patient not taking: Reported on 01/17/2023) 75 mL 12    albuterol (VENTOLIN HFA) 108 (90 Base) MCG/ACT inhaler Inhale 1-2 puffs into the lungs every 6 (six) hours as needed for wheezing or shortness of breath. 17 each 0    amLODipine (NORVASC) 10 MG tablet Take 10 mg by mouth daily.      amLODipine (NORVASC) 5 MG tablet Take 1 tablet (5 mg total) by mouth daily. 30 tablet 0    aspirin 81 MG chewable tablet Chew 1 tablet (81 mg total) by mouth daily. (Patient not taking: Reported on 01/17/2023) 30 tablet 0    cholecalciferol (VITAMIN D3) 25 MCG (1000 UNIT) tablet Take 1,000 Units by mouth daily. (Patient not taking: Reported on 01/17/2023)      DULoxetine (CYMBALTA) 60 MG capsule Take 1 capsule (60 mg total) by mouth daily. 30 capsule 0    fluticasone furoate-vilanterol (BREO ELLIPTA) 200-25 MCG/ACT AEPB Inhale 1 puff into the lungs daily. 180 each 10    gabapentin (NEURONTIN) 300 MG capsule  Take 1 capsule (300 mg total) by mouth daily. 12 capsule 0    hydrOXYzine (ATARAX) 50 MG tablet Take 2 tablets (100 mg total) by mouth every 6 (six) hours as needed for anxiety. 30 tablet 0    lidocaine (LIDODERM) 5 % Place 1 patch onto the skin daily. Remove & Discard patch within 12 hours or as directed by MD (Patient not taking: Reported on 01/17/2023) 30 patch 0    Lurasidone HCl 60 MG TABS Take 60 mg by mouth daily with breakfast.      methocarbamol (ROBAXIN) 500 MG tablet Take 500 mg by mouth 2 (two) times daily as needed for muscle spasms.      methylPREDNISolone (MEDROL DOSEPAK) 4 MG TBPK tablet Use  per package directions. 21 each 0    mirtazapine (REMERON) 15 MG tablet Take 15 mg by mouth at bedtime.      omeprazole (PRILOSEC) 20 MG capsule Take 20 mg by mouth daily before breakfast.      oxyCODONE (ROXICODONE) 5 MG immediate release tablet Take 1 tablet (5 mg total) by mouth every 6 (six) hours as needed for breakthrough pain. 10 tablet 0    pantoprazole (PROTONIX) 40 MG tablet Take 1 tablet (40 mg total) by mouth daily. (Patient not taking: Reported on 01/17/2023) 30 tablet 0    pregabalin (LYRICA) 150 MG capsule Take 1 capsule (150 mg total) by mouth 2 (two) times daily. 60 capsule 0    ramelteon (ROZEREM) 8 MG tablet Take 1 tablet (8 mg total) by mouth at bedtime. (Patient not taking: Reported on 01/17/2023) 30 tablet 0    tiZANidine (ZANAFLEX) 2 MG tablet Take 4 mg by mouth every 6 (six) hours as needed for muscle spasms.       Musculoskeletal: Strength & Muscle Tone: within normal limits Gait & Station: normal Patient leans: N/A            Psychiatric Specialty Exam:  Presentation  General Appearance:  Disheveled  Eye Contact: Minimal  Speech: Clear and Coherent; Normal Rate  Speech Volume: Normal  Handedness: Right   Mood and Affect  Mood: Depressed; Hopeless  Affect: Flat   Thought Process  Thought Processes: Coherent  Duration of Psychotic Symptoms:N/A Past Diagnosis of Schizophrenia or Psychoactive disorder: Yes  Descriptions of Associations:Intact  Orientation:Full (Time, Place and Person)  Thought Content:WDL  Hallucinations:Hallucinations: None  Ideas of Reference:None  Suicidal Thoughts:Suicidal Thoughts: Yes, Passive SI Passive Intent and/or Plan: Without Intent; With Plan; Without Means to Carry Out; Without Access to Means ("cut my wrists" no sharp objects)  Homicidal Thoughts:Homicidal Thoughts: No   Sensorium  Memory: Immediate Good; Remote Good  Judgment: Poor  Insight: Poor   Executive Functions   Concentration: Good  Attention Span: Good  Recall: Good  Fund of Knowledge: Good  Language: Good   Psychomotor Activity  Psychomotor Activity: Psychomotor Activity: Normal   Assets  Assets: Communication Skills   Sleep  Sleep: Sleep: Fair Number of Hours of Sleep: 8    Physical Exam: Physical Exam Vitals and nursing note reviewed.  Constitutional:      Appearance: Normal appearance.  HENT:     Head: Normocephalic and atraumatic.     Nose: Nose normal.  Pulmonary:     Effort: Pulmonary effort is normal.  Musculoskeletal:     Cervical back: Normal range of motion.     Right lower leg: Edema present.     Left lower leg: Tenderness present. Edema present.     Comments:  Uses walker and cane for ambulation  Neurological:     General: No focal deficit present.     Mental Status: She is alert and oriented to person, place, and time.  Psychiatric:        Attention and Perception: Attention and perception normal.        Mood and Affect: Mood is anxious and depressed.        Speech: Speech normal.        Behavior: Behavior is withdrawn. Behavior is cooperative.        Thought Content: Thought content includes suicidal ideation.        Cognition and Memory: Cognition and memory normal.        Judgment: Judgment normal.    ROS Blood pressure (!) 162/96, pulse 70, temperature 98 F (36.7 C), temperature source Temporal, resp. rate 16, height 4\' 11"  (1.499 m), weight 73.9 kg, last menstrual period 06/07/2020, SpO2 98%. Body mass index is 32.92 kg/m.  Treatment Plan Summary: Daily contact with patient to assess and evaluate symptoms and progress in treatment and Medication management  Observation Level/Precautions:  Continuous Observation Detox Fall 15 minute checks Seizure  Laboratory:      Psychotherapy:    Medications:  Duloxetine  Pregabalin Gabapentin  Tizanidine and Lidocaine patches Aspirin and Amlodipine  Consultations:    Discharge Concerns:     Estimated LOS:  Other:  COWS   Physician Treatment Plan for Primary Diagnosis: Polysubstance abuse (HCC) Long Term Goal(s): Improvement in symptoms so as ready for discharge  Short Term Goals: Ability to identify changes in lifestyle to reduce recurrence of condition will improve, Ability to verbalize feelings will improve, Ability to disclose and discuss suicidal ideas, Ability to demonstrate self-control will improve, Ability to identify and develop effective coping behaviors will improve, Ability to maintain clinical measurements within normal limits will improve, Compliance with prescribed medications will improve, and Ability to identify triggers associated with substance abuse/mental health issues will improve  Physician Treatment Plan for Secondary Diagnosis: Principal Problem:   Polysubstance abuse (HCC) Active Problems:   Schizoaffective disorder (HCC)  Long Term Goal(s): Improvement in symptoms so as ready for discharge  Short Term Goals: Ability to identify changes in lifestyle to reduce recurrence of condition will improve, Ability to verbalize feelings will improve, Ability to disclose and discuss suicidal ideas, Ability to demonstrate self-control will improve, Ability to identify and develop effective coping behaviors will improve, Ability to maintain clinical measurements within normal limits will improve, Compliance with prescribed medications will improve, and Ability to identify triggers associated with substance abuse/mental health issues will improve  I certify that inpatient services furnished can reasonably be expected to improve the patient's condition.    Myriam Forehand, NP 11/16/20246:03 PM

## 2023-03-03 NOTE — Progress Notes (Signed)
Pt was accepted to [facility] TODAY 03/03/2023; Bed Assignment to be assigned by [facility] Charge RN. BMU FAX Number [phone] Address: [address] [place], [place], [place] [address]  [id number] D/0/polysubstance abuse  Pt meets inpatient criteria per Loreen Freud  Attending Physician will be Dr. Lewanda Rife, MD  Report can be called to: [phone]  Pt can arrive after:1200AM  Per [facility] AC, Please call report to [phone] AT time of transport. Provider, please include admission orders, agitation protocol and applicable COWS/CIWA. Pt. has been pre-registered.   Care Team notified: [facility] Antoinette [place], RN Johnnette Gourd, Rashaun Dixon,NP, Mertha Finders, Zeenat Mamudu,RN, Kerrie Johnson,LCMHC, Ene [place],NP, Dawn [place], [place] [facility] [place], Johnny Cook, Tanika Lewis,NP  Maryjean Ka, MSW, LCSWA 03/03/2023 12:50 AM

## 2023-03-03 NOTE — Group Note (Signed)
Date:  03/03/2023 Time:  8:57 PM  Group Topic/Focus:  Wrap-Up Group:   The focus of this group is to help patients review their daily goal of treatment and discuss progress on daily workbooks.    Participation Level:  Did Not Attend   Lenore Cordia 03/03/2023, 8:57 PM

## 2023-03-03 NOTE — Progress Notes (Signed)
   03/03/23 1000  Psych Admission Type (Psych Patients Only)  Admission Status Voluntary  Psychosocial Assessment  Patient Complaints Anxiety;Crying spells;Depression;Helplessness  Eye Contact Fair  Facial Expression Anxious;Flat;Sad  Affect Anxious;Depressed;Sad  Speech Logical/coherent;Soft  Interaction Assertive  Motor Activity Slow  Appearance/Hygiene Disheveled;In scrubs  Behavior Characteristics Cooperative;Appropriate to situation  Mood Anxious;Depressed;Despair  Thought Process  Coherency WDL  Content WDL  Delusions None reported or observed  Perception WDL  Hallucination None reported or observed  Judgment Impaired  Confusion None  Danger to Self  Current suicidal ideation? Denies  Danger to Others  Danger to Others None reported or observed  Danger to Others Abnormal  Harmful Behavior to others No threats or harm toward other people  Destructive Behavior No threats or harm toward property

## 2023-03-03 NOTE — Progress Notes (Signed)
D- Patient alert and oriented. Affect/mood. Denies SI/ HI/ AVH. Patient denies pain. Patient endorses depression and anxiety. Goal of the day. A- Scheduled medications administered to patient, per MD orders. Support and encouragement provided.  Routine safety checks conducted every 15 minutes without incident.  Patient informed to notify staff with problems or concerns and verbalizes understanding. R- No adverse drug reactions noted.  Patient compliant with medications and treatment plan. Patient receptive, calm cooperative and interacts well with others on the unit.  Patient contracts for safety and  remains safe on the unit at this time. 

## 2023-03-03 NOTE — Plan of Care (Signed)
  Problem: Education: Goal: Knowledge of  General Education information/materials will improve 03/03/2023 1303 by Delos Haring, RN Outcome: Not Progressing 03/03/2023 1303 by Delos Haring, RN Outcome: Not Progressing Goal: Emotional status will improve 03/03/2023 1303 by Delos Haring, RN Outcome: Not Progressing 03/03/2023 1303 by Delos Haring, RN Outcome: Not Progressing Goal: Mental status will improve 03/03/2023 1303 by Delos Haring, RN Outcome: Not Progressing 03/03/2023 1303 by Delos Haring, RN Outcome: Not Progressing Goal: Verbalization of understanding the information provided will improve 03/03/2023 1303 by Delos Haring, RN Outcome: Not Progressing 03/03/2023 1303 by Delos Haring, RN Outcome: Not Progressing   Problem: Activity: Goal: Interest or engagement in activities will improve 03/03/2023 1303 by Delos Haring, RN Outcome: Not Progressing 03/03/2023 1303 by Delos Haring, RN Outcome: Not Progressing Goal: Sleeping patterns will improve 03/03/2023 1303 by Delos Haring, RN Outcome: Not Progressing 03/03/2023 1303 by Delos Haring, RN Outcome: Not Progressing   Problem: Coping: Goal: Ability to verbalize frustrations and anger appropriately will improve 03/03/2023 1303 by Delos Haring, RN Outcome: Not Progressing 03/03/2023 1303 by Delos Haring, RN Outcome: Not Progressing Goal: Ability to demonstrate self-control will improve 03/03/2023 1303 by Delos Haring, RN Outcome: Not Progressing 03/03/2023 1303 by Delos Haring, RN Outcome: Not Progressing   Problem: Health Behavior/Discharge Planning: Goal: Identification of resources available to assist in meeting health care needs will improve 03/03/2023 1303 by Delos Haring, RN Outcome: Not Progressing 03/03/2023 1303 by Delos Haring, RN Outcome: Not Progressing Goal: Compliance with treatment plan for underlying cause of condition will  improve 03/03/2023 1303 by Delos Haring, RN Outcome: Not Progressing 03/03/2023 1303 by Delos Haring, RN Outcome: Not Progressing   Problem: Physical Regulation: Goal: Ability to maintain clinical measurements within normal limits will improve 03/03/2023 1303 by Delos Haring, RN Outcome: Not Progressing 03/03/2023 1303 by Delos Haring, RN Outcome: Not Progressing   Problem: Safety: Goal: Periods of time without injury will increase 03/03/2023 1303 by Delos Haring, RN Outcome: Not Progressing 03/03/2023 1303 by Delos Haring, RN Outcome: Not Progressing

## 2023-03-03 NOTE — Plan of Care (Signed)

## 2023-03-03 NOTE — Progress Notes (Signed)
Pt is alert and oriented at this time, she arrived disheveled and tired. Patient appearance is unkempt, patience had to be wheeled unto the unit with a wheelchair.  She reports anxiety, depression and chronic knee pain. Pt reported SI with no plan. Pt is currently homeless state that she lives off the street. She reported using cocaine and drinking alcohol every day. As well as smokes two packs of cigarettes everyday. Pt said she could hear voices telling her to kill  BP is elevated , Skin checks is clear. Pt stated she fell down once in the last six months. Last BM 03/02/23. Will monitor patient.

## 2023-03-03 NOTE — ED Notes (Addendum)
Per Sharyne Peach, RN was told to call report to accepting facility AT time of transport. Report given at 0121.

## 2023-03-04 NOTE — Plan of Care (Signed)
  Problem: Education: Goal: Knowledge of [facility] Education information/materials will improve Outcome: Progressing Goal: Emotional status will improve Outcome: Progressing Goal: Verbalization of understanding the information provided will improve Outcome: Progressing   Problem: Activity: Goal: Interest or engagement in activities will improve Outcome: Progressing   Problem: Health Behavior/Discharge Planning: Goal: Identification of resources available to assist in meeting health care needs will improve Outcome: Progressing Goal: Compliance with treatment plan for underlying cause of condition will improve Outcome: Progressing   Problem: Physical Regulation: Goal: Ability to maintain clinical measurements within normal limits will improve Outcome: Progressing   Problem: Safety: Goal: Periods of time without injury will increase Outcome: Progressing

## 2023-03-04 NOTE — [facility] Counselor (Signed)
The patient probation officer called the LSWA back and the LCSWA informed her that she is currently in the hospital. The probation officer stated that she need a signature to extend her probation. Pending charges has been taken care of on the 14th, they have been dismissed.    Patric Dykes, Theresia Majors, MSW

## 2023-03-04 NOTE — Group Note (Signed)
LCSW Group Therapy Note   Group Date: 03/04/2023 Start Time: 1310 End Time: 1350   Type of Therapy and Topic:  Group Therapy: Feelings Awareness  Participation Level:  Did Not Attend      Summary of Patient Progress:  The patient did not attend group.    Marshell Levan, LCSWA 03/04/2023  2:09 PM

## 2023-03-04 NOTE — Progress Notes (Signed)
.  D- Patient alert and oriented. Patient presented in a pleasant. Patient had complaints of a little anxiety because she want to find house and wants to get help. Denies SI, HI, AVH, and pain. Lidocaine patch still present.  A- Scheduled medications administered to patient, per MD orders. Support and encouragement provided.  Routine safety checks conducted every 15 minutes.  Patient informed to notify staff with problems or concerns. R- No adverse drug reactions noted. Patient contracts for safety at this time. Patient compliant with medications and treatment plan. Patient receptive, calm, and cooperative. Patient interacts well with others on the unit.  Patient remains safe at this time.   03/04/23 2200  Psychosocial Assessment  Patient Complaints Anxiety  Eye Contact Fair  Facial Expression Anxious  Affect Flat  Speech Logical/coherent  Interaction Assertive  Motor Activity Slow  Appearance/Hygiene In scrubs  Behavior Characteristics Appropriate to situation  Mood Anxious  Thought Process  Coherency WDL  Content WDL  Delusions None reported or observed  Perception WDL  Hallucination None reported or observed  Judgment WDL  Confusion None  Danger to Self  Current suicidal ideation? Denies  Danger to Others  Danger to Others None reported or observed  Danger to Others Abnormal  Harmful Behavior to others No threats or harm toward other people

## 2023-03-04 NOTE — Group Note (Signed)
Date:  03/04/2023 Time:  8:45 PM  Group Topic/Focus:  Wrap-Up Group:   The focus of this group is to help patients review their daily goal of treatment and discuss progress on daily workbooks.    Participation Level:  Did Not Attend   Lynelle Smoke [facility] 03/04/2023, 8:45 PM
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
## 2023-03-04 NOTE — Plan of Care (Signed)
?  Problem: Education: ?Goal: Knowledge of  General Education information/materials will improve ?Outcome: Progressing ?Goal: Emotional status will improve ?Outcome: Progressing ?Goal: Mental status will improve ?Outcome: Progressing ?Goal: Verbalization of understanding the information provided will improve ?Outcome: Progressing ?  ?Problem: Activity: ?Goal: Interest or engagement in activities will improve ?Outcome: Progressing ?Goal: Sleeping patterns will improve ?Outcome: Progressing ?  ?Problem: Coping: ?Goal: Ability to verbalize frustrations and anger appropriately will improve ?Outcome: Progressing ?Goal: Ability to demonstrate self-control will improve ?Outcome: Progressing ?  ?Problem: Health Behavior/Discharge Planning: ?Goal: Identification of resources available to assist in meeting health care needs will improve ?Outcome: Progressing ?Goal: Compliance with treatment plan for underlying cause of condition will improve ?Outcome: Progressing ?  ?Problem: Safety: ?Goal: Periods of time without injury will increase ?Outcome: Progressing ?  ?

## 2023-03-04 NOTE — [facility] Suicide Risk Assessment (Signed)
[facility] INPATIENT:  Family/Significant Other Suicide Prevention Education  Suicide Prevention Education:  Patient Refusal for Family/Significant Other Suicide Prevention Education: The patient Kathryn [name] has refused to provide written consent for family/significant other to be provided Family/Significant Other Suicide Prevention Education during admission and/or prior to discharge.  Physician notified.  SPE completed with pt, as pt refused to consent to family contact. SPI pamphlet provided to pt and pt was encouraged to share information with support network, ask questions, and talk about any concerns relating to SPE. Pt denies access to guns/firearms and verbalized understanding of information provided. Mobile Crisis information also provided to pt.       Marshell Levan 03/04/2023, 3:51 PM

## 2023-03-04 NOTE — Progress Notes (Addendum)
[facility] MD Progress Note  03/04/2023 1:20 PM Kathryn [name]  MRN:  [id number] Subjective:  52 year old African American female who reports improvement in her condition.I am feeling better."Expresses feeling better emotionally and physically. States she understands that her probation has been extended due to her hospitalization but does not elaborate further.Denies suicidal ideation (SI), homicidal ideation (HI), or self-injurious behavior (SIB).Probation extended due to hospitalization, requiring coordination with legal or probation officers as needed. Principal Problem: Polysubstance abuse (HCC) Diagnosis: Principal Problem:   Polysubstance abuse (HCC) Active Problems:   Schizoaffective disorder (HCC)  Total Time spent with patient: 1 hour  Past Psychiatric History: Anxiety Depression Bipolar Disorder  Past Medical History:  Past Medical History:  Diagnosis Date   Anxiety    Arthritis    Asthma    Bipolar disorder (HCC)    Chronic back pain    COPD (chronic obstructive pulmonary disease) (HCC)    Chronic bronchitis   Depression    Dyspnea    GERD (gastroesophageal reflux disease)    Hypertension    Migraine    Neuropathy    Osteoarthritis of left knee, 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   Single subsegmental pulmonary embolism without acute cor pulmonale (HCC) 06/20/2021   Sleep apnea 04/2020   GETTING A cpap   Suicidal ideation 12/24/2018    Past Surgical History:  Procedure Laterality Date   BACK SURGERY     DILATATION AND CURETTAGE/HYSTEROSCOPY WITH MINERVA N/A 06/09/2020   Procedure: DILATATION AND CURETTAGE/HYSTEROSCOPY WITH MINERVA;  Surgeon: Lazaro Arms, MD;  Location: [facility];  Service: Gynecology;  Laterality: N/A;   ESOPHAGOGASTRODUODENOSCOPY (EGD) WITH PROPOFOL N/A 12/25/2018   Procedure: ESOPHAGOGASTRODUODENOSCOPY (EGD) WITH PROPOFOL;  Surgeon: Malissa Hippo, MD;  Location: [facility];  Service: Endoscopy;  Laterality: N/A;   FLEXIBLE SIGMOIDOSCOPY  10/14/2021    Procedure: FLEXIBLE SIGMOIDOSCOPY;  Surgeon: Dolores Frame, MD;  Location: [facility];  Service: Gastroenterology;;   PATELLA-FEMORAL ARTHROPLASTY Left 10/08/2018   Procedure: PATELLA-FEMORAL ARTHROPLASTY;  Surgeon: Teryl Lucy, MD;  Location: [facility];  Service: Orthopedics;  Laterality: Left;   TUBAL LIGATION     Family History:  Family History  Problem Relation Age of Onset   Gout Paternal Grandfather    Cirrhosis Paternal Grandfather    Hypertension Paternal Grandmother    Aneurysm Paternal Grandmother    Cirrhosis Maternal Grandmother    Cirrhosis Maternal Grandfather    Cancer Father    Cirrhosis Father    Cirrhosis Mother    Breast cancer Sister    Hypertension Sister    Bronchitis Daughter    Bronchitis Daughter    Asthma Son    Bronchitis Son    Migraines Neg Hx    Family Psychiatric  History: see above Social History:  Social History   Substance and Sexual Activity  Alcohol Use Not Currently   Comment: states quit  06/29/22     Social History   Substance and Sexual Activity  Drug Use Yes   Types: Cocaine   Comment: last used 05/2022    Social History   Socioeconomic History   Marital status: Single    Spouse name: Not on file   Number of children: 3   Years of education: 10   Highest education level: 10th grade  Occupational History   Not on file  Tobacco Use   Smoking status: Former    Current packs/day: 0.00    Average packs/day: 0.3 packs/day for 10.0 years (2.5 ttl pk-yrs)    Types: Cigarettes  Start date: 04/2010    Quit date: 04/2020    Years since quitting: 2.8    Passive exposure: Never   Smokeless tobacco: Never   Tobacco comments:    Smokes often, especially when using crack cocaine  Vaping Use   Vaping status: Never Used  Substance and Sexual Activity   Alcohol use: Not Currently    Comment: states quit  06/29/22   Drug use: Yes    Types: Cocaine    Comment: last used 05/2022   Sexual activity: Yes    Birth  control/protection: Surgical    Comment: tubal, ablation  Other Topics Concern   Not on file  Social History Narrative   R handed    Lives with boyfriend   1 Cup of caffeine daily    Social Determinants of Health   Financial Resource Strain: Not on file  Food Insecurity: Food Insecurity Present (03/03/2023)   Hunger Vital Sign    Worried About Programme researcher, broadcasting/film/video in the Last Year: Often true    Ran Out of Food in the Last Year: Often true  Transportation Needs: Unmet Transportation Needs (03/03/2023)   PRAPARE - Administrator, Civil Service (Medical): Yes    Lack of Transportation (Non-Medical): Yes  Physical Activity: Not on file  Stress: Not on file  Social Connections: Unknown (08/30/2021)   Received from [employer], [facility]   Social Network    Social Network: Not on file   Additional Social History:                         Sleep: Good  Appetite:  Good  Current Medications: Current Facility-Administered Medications  Medication Dose Route Frequency Provider Last Rate Last Admin   alum & mag hydroxide-simeth (MAALOX/MYLANTA) 200-200-20 MG/5ML suspension 30 mL  30 mL Oral Q4H PRN Bobbitt, Shalon E, NP       amLODipine (NORVASC) tablet 5 mg  5 mg Oral Daily Durwin Nora, Rashaun M, NP   5 mg at 03/04/23 4401   aspirin chewable tablet 81 mg  81 mg Oral Daily Dixon, Rashaun M, NP   81 mg at 03/04/23 0816   haloperidol lactate (HALDOL) injection 5 mg  5 mg Intramuscular TID PRN Bobbitt, Shalon E, NP       And   diphenhydrAMINE (BENADRYL) injection 50 mg  50 mg Intramuscular TID PRN Bobbitt, Shalon E, NP       And   LORazepam (ATIVAN) injection 2 mg  2 mg Intramuscular TID PRN Bobbitt, Shalon E, NP       DULoxetine (CYMBALTA) DR capsule 90 mg  90 mg Oral Daily Myriam Forehand, NP   90 mg at 03/04/23 0815   gabapentin (NEURONTIN) capsule 300 mg  300 mg Oral Daily Durwin Nora, Rashaun M, NP   300 mg at 03/04/23 0815   lidocaine (LIDODERM) 5 % 1 patch  1 patch  Transdermal Q24H Jearld Lesch, NP   1 patch at 03/04/23 1740   magnesium hydroxide (MILK OF MAGNESIA) suspension 30 mL  30 mL Oral Daily PRN Bobbitt, Shalon E, NP       nicotine polacrilex (NICORETTE) gum 2 mg  2 mg Oral PRN Myriam Forehand, NP       OLANZapine zydis (ZYPREXA) disintegrating tablet 5 mg  5 mg Oral TID PRN Jearld Lesch, NP       ondansetron (ZOFRAN-ODT) disintegrating tablet 4 mg  4 mg Oral Q6H PRN  Bobbitt, Shalon E, NP       pregabalin (LYRICA) capsule 150 mg  150 mg Oral BID Dixon, Rashaun M, NP   150 mg at 03/04/23 1740   tiZANidine (ZANAFLEX) tablet 4 mg  4 mg Oral Q6H PRN Jearld Lesch, NP   4 mg at 03/04/23 1740    Lab Results: No results found for this or any previous visit (from the past 48 hour(s)).  Blood Alcohol level:  Lab Results  Component Value Date   ETH <10 03/01/2023   ETH <10 06/29/2022    Metabolic Disorder Labs: Lab Results  Component Value Date   HGBA1C 5.5 01/10/2022   MPG 111.15 01/10/2022   MPG 111.15 10/19/2021   No results found for: "PROLACTIN" Lab Results  Component Value Date   CHOL 168 01/10/2022   TRIG 56 01/10/2022   HDL 57 01/10/2022   CHOLHDL 2.9 01/10/2022   VLDL 11 01/10/2022   LDLCALC 100 (H) 01/10/2022   LDLCALC 84 10/19/2021    Physical Findings: AIMS:  , ,  ,  ,    CIWA:    COWS:  COWS Total Score: 1  Musculoskeletal: Strength & Muscle Tone: within normal limits Gait & Station: normal Patient leans: N/A  Psychiatric Specialty Exam:  Presentation  General Appearance:  Disheveled  Eye Contact: Good  Speech: Clear and Coherent  Speech Volume: Normal  Handedness: Right   Mood and Affect  Mood: Anxious  Affect: Flat   Thought Process  Thought Processes: Coherent  Descriptions of Associations:Intact  Orientation:Full (Time, Place and Person)  Thought Content:WDL  History of Schizophrenia/Schizoaffective disorder:No  Duration of Psychotic  Symptoms:N/A  Hallucinations:Hallucinations: None  Ideas of Reference:None  Suicidal Thoughts:Suicidal Thoughts: No SI Passive Intent and/or Plan: -- (denies)  Homicidal Thoughts:Homicidal Thoughts: No   Sensorium  Memory: Immediate Good; Remote Good  Judgment: Poor  Insight: Poor   Executive Functions  Concentration: Good  Attention Span: Fair  Recall: Good  Fund of Knowledge: Good  Language: Good   Psychomotor Activity  Psychomotor Activity: Psychomotor Activity: Normal   Assets  Assets: Communication Skills   Sleep  Sleep: Sleep: Fair Number of Hours of Sleep: 5    Physical Exam: Physical Exam Vitals and nursing note reviewed.  Constitutional:      Appearance: Normal appearance.  HENT:     Head: Normocephalic and atraumatic.     Nose: Nose normal.  Pulmonary:     Effort: Pulmonary effort is normal.  Musculoskeletal:        General: Normal range of motion.     Cervical back: Normal range of motion.  Neurological:     General: No focal deficit present.     Mental Status: She is alert and oriented to person, place, and time.  Psychiatric:        Attention and Perception: Attention and perception normal.        Mood and Affect: Mood is anxious. Affect is flat.        Speech: Speech normal.        Behavior: Behavior is withdrawn. Behavior is cooperative.        Thought Content: Thought content normal.        Cognition and Memory: Cognition and memory normal.        Judgment: Judgment normal.    ROS Blood pressure 136/79, pulse 84, temperature 97.6 F (36.4 C), resp. rate 16, height 4\' 11"  (1.499 m), weight 73.9 kg, last menstrual period 06/07/2020, SpO2 100%. Body mass  index is 32.92 kg/m.   Treatment Plan Summary: Daily contact with patient to assess and evaluate symptoms and progress in treatment and Medication management Encourage participation in group therapy and unit activities to support continued emotional and social  improvement. Duloxetine (Cymbalta): 60 mg daily Management of depression, anxiety, and/or neuropathic pain. Pregabalin (Lyrica):75 mg twice daily (adjust as necessary based on renal function and response).Neuropathic pain or generalized anxiety disorder. Gabapentin (Neurontin):300 mg three times daily Neuropathic pain or adjunctive therapy for mood stabilization. Tizanidine (Zanaflex): 2-4 mg as needed up to 3 times daily Management of muscle spasms or spasticity. Lidocaine Patches:Apply 1-3 patches to the affected area for up to 12 hours in a 24-hour period.Localized pain relief. Aspirin 81 mg daily Cardiovascular prophylaxis. Amlodipine (Norvasc 5 mg daily Management of hypertension. Myriam Forehand, NP 03/04/2023, 7:01 PM

## 2023-03-04 NOTE — [facility] Counselor (Signed)
The patient provided the LCSWA with her probation officer contact information Alda Ponder, [phone] and asked that we contacted her to inform her that she is currently in the hospital. The LCSWA attempted to call the patient probation officer but didn't get an answer. The LCSWA left a VM for the probation officer.     Patric Dykes, MSW, [facility] 03/04/23

## 2023-03-04 NOTE — Group Note (Signed)
Date:  03/04/2023 Time:  12:51 PM  Group Topic/Focus:  Goals Group:   The focus of this group is to help patients establish daily goals to achieve during treatment and discuss how the patient can incorporate goal setting into their daily lives to aide in recovery. Self Care:   The focus of this group is to help patients understand the importance of self-care in order to improve or restore emotional, physical, spiritual, interpersonal, and financial health.    Participation Level:  Did Not Attend   Rosaura Carpenter 03/04/2023, 12:51 PM

## 2023-03-04 NOTE — Group Note (Signed)
Date:  03/04/2023 Time:  4:21 PM  Group Topic/Focus:  OUTDOOR RECREATION STRUCTURED ACTIVITY    Participation Level:  Did Not Attend   Dianey Suchy 03/04/2023, 4:21 PM

## 2023-03-04 NOTE — Progress Notes (Signed)
D- Patient alert and oriented x 4. Affect anxious/mood congruent.  Denies SI/ HI/ AVH. Patient complains of pain to back and right leg 10/10. She takes scheduled Lyrica and lidocaine patch with fair results. Patient endorses depression and anxiety. She states her goals are to begin going to groups and getting "out more"  A- Scheduled medications administered to patient, per MD orders. Support and encouragement provided.  Routine safety checks conducted every 15 minutes without incident.  Patient informed to notify staff with problems or concerns and verbalizes understanding. R- No adverse drug reactions noted.  Patient compliant with medications and treatment plan. Patient receptive, calm and cooperative. She was isolative to her room except for meals. Patient contracts for safety and  remains safe on the unit at this time.

## 2023-03-04 NOTE — Plan of Care (Signed)

## 2023-03-04 NOTE — Progress Notes (Signed)
Assumed care of patient @1930 , last evening, pt. Spent the entire evening in bed with the exception for snack time. Pt presented with an anguished affect, c/o pain 10/10 to back and legs, mood was drowsy and depressed. Pt was given PRN medication for pain with little relief 7/10.Marland Kitchen Pt denied having thoughts to harm self but endorses depressions.  Scheduled medications administered to patient, per MD orders. Support and encouragement provided.  Routine safety checks conducted every 15 minutes.  Patient informed to notify staff with problems or concerns. R- No adverse drug reactions noted. Patient contracts for safety at this time. Patient compliant with medications and treatment plan. Patient receptive, calm, and cooperative. Patient interacts well with others on the unit.  Patient remains safe at this time.

## 2023-03-05 DIAGNOSIS — F1910 Other psychoactive substance abuse, uncomplicated: Secondary | ICD-10-CM

## 2023-03-05 MED ORDER — PANTOPRAZOLE SODIUM 40 MG PO TBEC
40.0000 mg | DELAYED_RELEASE_TABLET | Freq: Every day | ORAL | Status: DC
  Administered 2023-03-05 – 2023-03-12 (×8): 40 mg via ORAL
  Filled 2023-03-05 (×8): qty 1

## 2023-03-05 MED ORDER — MELATONIN 5 MG PO TABS
5.0000 mg | ORAL_TABLET | Freq: Every evening | ORAL | Status: DC | PRN
  Administered 2023-03-06: 5 mg via ORAL
  Filled 2023-03-05: qty 1

## 2023-03-05 NOTE — Group Note (Signed)
Date:  03/05/2023 Time:  10:12 AM  Group Topic/Focus:  Goals Group:   The focus of this group is to help patients establish daily goals to achieve during treatment and discuss how the patient can incorporate goal setting into their daily lives to aide in recovery. Relapse Prevention Planning:   The focus of this group is to define relapse and discuss the need for planning to combat relapse.    Participation Level:  Active  Participation Quality:  Appropriate  Affect:  Appropriate  Cognitive:  Alert, Appropriate, and Oriented  Insight: Appropriate  Engagement in Group:  Developing/Improving, Engaged, and Improving  Modes of Intervention:  Activity, Discussion, and Education  Additional Comments:    Rosaura Carpenter 03/05/2023, 10:12 AM

## 2023-03-05 NOTE — Progress Notes (Addendum)
[facility] MD Progress Note  03/05/2023 2:43 PM Kathryn [name]  MRN:  [id number] Subjective:  Pt chart reviewed, discussed with interdisciplinary team, and seen on rounds. Endorses anxiety today secondary to concerns about discharge plans. She is hoping to get into a residential substance use facility. She denies she is suicidal or homicidal. She denies auditory visual hallucinations or paranoia. Reports sleep is poor. Discussed starting melatonin 5mg  at bedtime prn sleep. She is in agreement. She reports appetite is good. She reports she is feeling better as she is not "out in the cold".   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, insomnia, generalized anxiety disorder, major depressive disorder, schizoaffective disorder bipolar type; polysubstance abuse  Past Medical History:  Past Medical History:  Diagnosis Date   Anxiety    Arthritis    Asthma    Bipolar disorder (HCC)    Chronic back pain    COPD (chronic obstructive pulmonary disease) (HCC)    Chronic bronchitis   Depression    Dyspnea    GERD (gastroesophageal reflux disease)    Hypertension    Migraine    Neuropathy    Osteoarthritis of left knee, 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   Single subsegmental pulmonary embolism without acute cor pulmonale (HCC) 06/20/2021   Sleep apnea 04/2020   GETTING A cpap   Suicidal ideation 12/24/2018    Past Surgical History:  Procedure Laterality Date   BACK SURGERY     DILATATION AND CURETTAGE/HYSTEROSCOPY WITH MINERVA N/A 06/09/2020   Procedure: DILATATION AND CURETTAGE/HYSTEROSCOPY WITH MINERVA;  Surgeon: Lazaro Arms, MD;  Location: [facility];  Service: Gynecology;  Laterality: N/A;   ESOPHAGOGASTRODUODENOSCOPY (EGD) WITH PROPOFOL N/A  12/25/2018   Procedure: ESOPHAGOGASTRODUODENOSCOPY (EGD) WITH PROPOFOL;  Surgeon: Malissa Hippo, MD;  Location: [facility];  Service: Endoscopy;  Laterality: N/A;   FLEXIBLE SIGMOIDOSCOPY  10/14/2021   Procedure: FLEXIBLE SIGMOIDOSCOPY;  Surgeon: Dolores Frame, MD;  Location: [facility];  Service: Gastroenterology;;   PATELLA-FEMORAL ARTHROPLASTY Left 10/08/2018   Procedure: PATELLA-FEMORAL ARTHROPLASTY;  Surgeon: Teryl Lucy, MD;  Location: [facility];  Service: Orthopedics;  Laterality: Left;   TUBAL LIGATION     Family History:  Family History  Problem Relation Age of Onset   Gout Paternal Grandfather    Cirrhosis Paternal Grandfather    Hypertension Paternal Grandmother    Aneurysm Paternal Grandmother    Cirrhosis Maternal Grandmother    Cirrhosis Maternal Grandfather    Cancer Father    Cirrhosis Father    Cirrhosis Mother    Breast cancer Sister    Hypertension Sister    Bronchitis Daughter    Bronchitis Daughter    Asthma Son    Bronchitis Son    Migraines Neg Hx    Family Psychiatric  History: None reported Social History:  Social History   Substance and Sexual Activity  Alcohol Use Not Currently   Comment: states quit  06/29/22     Social History   Substance and Sexual Activity  Drug Use Yes   Types: Cocaine   Comment: last used 05/2022    Social History   Socioeconomic History   Marital status: Single    Spouse name: Not on file   Number of children: 3   Years of education: 10   Highest education  level: 10th grade  Occupational History   Not on file  Tobacco Use   Smoking status: Former    Current packs/day: 0.00    Average packs/day: 0.3 packs/day for 10.0 years (2.5 ttl pk-yrs)    Types: Cigarettes    Start date: 04/2010    Quit date: 04/2020    Years since quitting: 2.8    Passive exposure: Never   Smokeless tobacco: Never   Tobacco comments:    Smokes often, especially when using crack cocaine  Vaping Use   Vaping  status: Never Used  Substance and Sexual Activity   Alcohol use: Not Currently    Comment: states quit  06/29/22   Drug use: Yes    Types: Cocaine    Comment: last used 05/2022   Sexual activity: Yes    Birth control/protection: Surgical    Comment: tubal, ablation  Other Topics Concern   Not on file  Social History Narrative   R handed    Lives with boyfriend   1 Cup of caffeine daily    Social Determinants of Health   Financial Resource Strain: Not on file  Food Insecurity: Food Insecurity Present (03/03/2023)   Hunger Vital Sign    Worried About Programme researcher, broadcasting/film/video in the Last Year: Often true    Ran Out of Food in the Last Year: Often true  Transportation Needs: Unmet Transportation Needs (03/03/2023)   PRAPARE - Administrator, Civil Service (Medical): Yes    Lack of Transportation (Non-Medical): Yes  Physical Activity: Not on file  Stress: Not on file  Social Connections: Unknown (08/30/2021)   Received from [employer], [facility]   Social Network    Social Network: Not on file   Additional Social History:                         Sleep: Poor  Appetite:  Good  Current Medications: Current Facility-Administered Medications  Medication Dose Route Frequency Provider Last Rate Last Admin   alum & mag hydroxide-simeth (MAALOX/MYLANTA) 200-200-20 MG/5ML suspension 30 mL  30 mL Oral Q4H PRN Bobbitt, Shalon E, NP   30 mL at 03/05/23 1212   amLODipine (NORVASC) tablet 5 mg  5 mg Oral Daily Jearld Lesch, NP   5 mg at 03/05/23 5784   aspirin chewable tablet 81 mg  81 mg Oral Daily Dixon, Rashaun M, NP   81 mg at 03/05/23 6962   haloperidol lactate (HALDOL) injection 5 mg  5 mg Intramuscular TID PRN Bobbitt, Shalon E, NP       And   diphenhydrAMINE (BENADRYL) injection 50 mg  50 mg Intramuscular TID PRN Bobbitt, Shalon E, NP       And   LORazepam (ATIVAN) injection 2 mg  2 mg Intramuscular TID PRN Bobbitt, Shalon E, NP       DULoxetine  (CYMBALTA) DR capsule 90 mg  90 mg Oral Daily Myriam Forehand, NP   90 mg at 03/05/23 0842   gabapentin (NEURONTIN) capsule 300 mg  300 mg Oral Daily Dixon, Rashaun M, NP   300 mg at 03/05/23 0843   lidocaine (LIDODERM) 5 % 1 patch  1 patch Transdermal Q24H Jearld Lesch, NP   1 patch at 03/04/23 1740   magnesium hydroxide (MILK OF MAGNESIA) suspension 30 mL  30 mL Oral Daily PRN Bobbitt, Shalon E, NP       melatonin tablet 5 mg  5 mg  Oral QHS PRN Lauree Chandler, NP       nicotine polacrilex (NICORETTE) gum 2 mg  2 mg Oral PRN Myriam Forehand, NP       OLANZapine zydis (ZYPREXA) disintegrating tablet 5 mg  5 mg Oral TID PRN Jearld Lesch, NP       ondansetron (ZOFRAN-ODT) disintegrating tablet 4 mg  4 mg Oral Q6H PRN Bobbitt, Shalon E, NP       pregabalin (LYRICA) capsule 150 mg  150 mg Oral BID Dixon, Rashaun M, NP   150 mg at 03/05/23 0842   tiZANidine (ZANAFLEX) tablet 4 mg  4 mg Oral Q6H PRN Jearld Lesch, NP   4 mg at 03/04/23 1740    Lab Results: No results found for this or any previous visit (from the past 48 hour(s)).  Blood Alcohol level:  Lab Results  Component Value Date   ETH <10 03/01/2023   ETH <10 06/29/2022    Metabolic Disorder Labs: Lab Results  Component Value Date   HGBA1C 5.5 01/10/2022   MPG 111.15 01/10/2022   MPG 111.15 10/19/2021   No results found for: "PROLACTIN" Lab Results  Component Value Date   CHOL 168 01/10/2022   TRIG 56 01/10/2022   HDL 57 01/10/2022   CHOLHDL 2.9 01/10/2022   VLDL 11 01/10/2022   LDLCALC 100 (H) 01/10/2022   LDLCALC 84 10/19/2021    Physical Findings: AIMS:  , ,  ,  ,    CIWA:    COWS:  COWS Total Score: 1  Musculoskeletal: Strength & Muscle Tone:  ambulates with walker on unit Gait & Station:  ambulates with walker on unit Patient leans:  ambulates with walker on unit  Psychiatric Specialty Exam:  Presentation  General Appearance:  Appropriate for Environment; Fairly Groomed  Eye  Contact: Good  Speech: Clear and Coherent; Normal Rate  Speech Volume: Normal  Handedness: Right   Mood and Affect  Mood: Anxious  Affect: Blunt   Thought Process  Thought Processes: Coherent; Goal Directed; Linear  Descriptions of Associations:Intact  Orientation:Full (Time, Place and Person)  Thought Content:Logical  History of Schizophrenia/Schizoaffective disorder:No  Duration of Psychotic Symptoms:N/A  Hallucinations:Hallucinations: None  Ideas of Reference:None  Suicidal Thoughts:Suicidal Thoughts: No  Homicidal Thoughts:Homicidal Thoughts: No   Sensorium  Memory: Immediate Good; Recent Good; Remote Good  Judgment: Intact  Insight: Present   Executive Functions  Concentration: Good  Attention Span: Good  Recall: Good  Fund of Knowledge: Good  Language: Good   Psychomotor Activity  Psychomotor Activity: Psychomotor Activity: Normal   Assets  Assets: Communication Skills; Desire for Improvement; Financial Resources/Insurance; Leisure Time; Resilience  Sleep  Sleep: Sleep: Poor  Physical Exam: Physical Exam Constitutional:      General: She is not in acute distress.    Appearance: She is not ill-appearing, toxic-appearing or diaphoretic.  Eyes:     General: No scleral icterus. Cardiovascular:     Rate and Rhythm: Normal rate.  Pulmonary:     Effort: Pulmonary effort is normal. No respiratory distress.  Neurological:     Mental Status: She is alert and oriented to person, place, and time.  Psychiatric:        Attention and Perception: Attention and perception normal.        Mood and Affect: Mood is anxious. Affect is blunt.        Speech: Speech normal.        Behavior: Behavior normal. Behavior is cooperative.  Thought Content: Thought content normal.        Cognition and Memory: Cognition and memory normal.        Judgment: Judgment normal.    Review of Systems  Constitutional:  Negative for chills  and fever.  Respiratory:  Negative for shortness of breath.   Cardiovascular:  Negative for chest pain and palpitations.  Gastrointestinal:  Negative for abdominal pain.  Psychiatric/Behavioral:  Positive for substance abuse. The patient is nervous/anxious and has insomnia.    Blood pressure (!) 147/101, pulse 74, temperature 98.6 F (37 C), resp. rate 17, height 4\' 11"  (1.499 m), weight 73.9 kg, last menstrual period 06/07/2020, SpO2 98%. Body mass index is 32.92 kg/m.   Treatment Plan Summary: Daily contact with patient to assess and evaluate symptoms and progress in treatment, Medication management, and Plan    -Continue cymbalta 90mg  oral daily for depression, anxiety, and pain -Continue gabapentin 300mg  oral daily for anxiety, pain -Continue haldol 5mg  IM 3 times daily PRN agitation, moderate agitation AND benadryl 50mg  IM 3 times daily PRN moderate agitation AND ativan 2mg  IM 3 times daily PRN moderate agitation -Continue nicorette 2mg  oral as needed smoking cessation -Start melatonin 5mg  oral at bedtime PRN sleep -Continue zyprexa 5mg  oral 3 times daily PRN mild agitation  Lauree Chandler, NP 03/05/2023, 2:43 PM

## 2023-03-05 NOTE — Group Note (Signed)
Recreation Therapy Group Note   Group Topic:Coping Skills  Group Date: 03/05/2023 Start Time: 1000 End Time: 1050 Facilitators: Rosina Lowenstein, LRT, CTRS Location:  Craft Room  Group Description: Mind Map.  Patient was provided a blank template of a diagram with 32 blank boxes in a tiered system, branching from the center (similar to a bubble chart). LRT directed patients to label the middle of the diagram "Coping Skills". LRT and patients then came up with 8 different coping skills as examples. Pt were directed to record their coping skills in the 2nd tier boxes closest to the center.  Patients would then share their coping skills with the group as LRT wrote them out. LRT gave a handout of 99 different coping skills at the end of group.   Goal Area(s) Addressed: Patients will be able to define "coping skills". Patient will identify new coping skills.  Patient will increase communication.   Affect/Mood: Appropriate   Participation Level: Active and Engaged   Participation Quality: Independent   Behavior: Calm and Sedated   Speech/Thought Process: Coherent   Insight: Fair   Judgement: Good   Modes of Intervention: Guided Discussion and Worksheet   Patient Response to Interventions:  Receptive   Education Outcome:  Acknowledges education   Clinical Observations/Individualized Feedback: Kathryn [name] was active in their participation of session activities and group discussion. Pt identified "praying, church, bungee jumping" as coping skills. Pt appeared to be falling asleep in the middle of group while LRT and peers were talking. However, pt would become alert and contribute to group discussion spontaneously. Pt was pleasant and interacted well with LRT and peers.    Plan: Continue to engage patient in RT group sessions 2-3x/week.   Rosina Lowenstein, LRT, CTRS 03/05/2023 12:39 PM

## 2023-03-05 NOTE — BH IP Treatment Plan (Signed)
Interdisciplinary Treatment and Diagnostic Plan Update  03/05/2023 Time of Session: 9:42AM Kathryn [name] MRN: [id number]  Principal Diagnosis: Polysubstance abuse [facility])  Secondary Diagnoses: Principal Problem:   Polysubstance abuse (HCC) Active Problems:   Schizoaffective disorder (HCC)   Current Medications:  Current Facility-Administered Medications  Medication Dose Route Frequency Provider Last Rate Last Admin   alum & mag hydroxide-simeth (MAALOX/MYLANTA) 200-200-20 MG/5ML suspension 30 mL  30 mL Oral Q4H PRN Bobbitt, Shalon E, NP       amLODipine (NORVASC) tablet 5 mg  5 mg Oral Daily Durwin Nora, Rashaun M, NP   5 mg at 03/05/23 4098   aspirin chewable tablet 81 mg  81 mg Oral Daily Dixon, Rashaun M, NP   81 mg at 03/05/23 1191   haloperidol lactate (HALDOL) injection 5 mg  5 mg Intramuscular TID PRN Bobbitt, Shalon E, NP       And   diphenhydrAMINE (BENADRYL) injection 50 mg  50 mg Intramuscular TID PRN Bobbitt, Shalon E, NP       And   LORazepam (ATIVAN) injection 2 mg  2 mg Intramuscular TID PRN Bobbitt, Shalon E, NP       DULoxetine (CYMBALTA) DR capsule 90 mg  90 mg Oral Daily Myriam Forehand, NP   90 mg at 03/05/23 0842   gabapentin (NEURONTIN) capsule 300 mg  300 mg Oral Daily Lerry Liner M, NP   300 mg at 03/05/23 0843   lidocaine (LIDODERM) 5 % 1 patch  1 patch Transdermal Q24H Jearld Lesch, NP   1 patch at 03/04/23 1740   magnesium hydroxide (MILK OF MAGNESIA) suspension 30 mL  30 mL Oral Daily PRN Bobbitt, Shalon E, NP       nicotine polacrilex (NICORETTE) gum 2 mg  2 mg Oral PRN Myriam Forehand, NP       OLANZapine zydis (ZYPREXA) disintegrating tablet 5 mg  5 mg Oral TID PRN Jearld Lesch, NP       ondansetron (ZOFRAN-ODT) disintegrating tablet 4 mg  4 mg Oral Q6H PRN Bobbitt, Shalon E, NP       pregabalin (LYRICA) capsule 150 mg  150 mg Oral BID Durwin Nora, Rashaun M, NP   150 mg at 03/05/23 0842   tiZANidine (ZANAFLEX) tablet 4 mg  4 mg Oral Q6H PRN Jearld Lesch, NP   4 mg at 03/04/23 1740   PTA Medications: Medications Prior to Admission  Medication Sig Dispense Refill Last Dose   albuterol (PROVENTIL) (2.5 MG/3ML) 0.083% nebulizer solution Take 3 mLs (2.5 mg total) by nebulization every 6 (six) hours as needed for wheezing or shortness of breath. (Patient not taking: Reported on 01/17/2023) 75 mL 12    albuterol (VENTOLIN HFA) 108 (90 Base) MCG/ACT inhaler Inhale 1-2 puffs into the lungs every 6 (six) hours as needed for wheezing or shortness of breath. 17 each 0    amLODipine (NORVASC) 10 MG tablet Take 10 mg by mouth daily.      amLODipine (NORVASC) 5 MG tablet Take 1 tablet (5 mg total) by mouth daily. 30 tablet 0    aspirin 81 MG chewable tablet Chew 1 tablet (81 mg total) by mouth daily. (Patient not taking: Reported on 01/17/2023) 30 tablet 0    cholecalciferol (VITAMIN D3) 25 MCG (1000 UNIT) tablet Take 1,000 Units by mouth daily. (Patient not taking: Reported on 01/17/2023)      DULoxetine (CYMBALTA) 60 MG capsule Take 1 capsule (60 mg total) by mouth daily. 30 capsule  0    fluticasone furoate-vilanterol (BREO ELLIPTA) 200-25 MCG/ACT AEPB Inhale 1 puff into the lungs daily. 180 each 10    gabapentin (NEURONTIN) 300 MG capsule Take 1 capsule (300 mg total) by mouth daily. 12 capsule 0    hydrOXYzine (ATARAX) 50 MG tablet Take 2 tablets (100 mg total) by mouth every 6 (six) hours as needed for anxiety. 30 tablet 0    lidocaine (LIDODERM) 5 % Place 1 patch onto the skin daily. Remove & Discard patch within 12 hours or as directed by MD (Patient not taking: Reported on 01/17/2023) 30 patch 0    Lurasidone HCl 60 MG TABS Take 60 mg by mouth daily with breakfast.      methocarbamol (ROBAXIN) 500 MG tablet Take 500 mg by mouth 2 (two) times daily as needed for muscle spasms.      methylPREDNISolone (MEDROL DOSEPAK) 4 MG TBPK tablet Use per package directions. 21 each 0    mirtazapine (REMERON) 15 MG tablet Take 15 mg by mouth at bedtime.      omeprazole  (PRILOSEC) 20 MG capsule Take 20 mg by mouth daily before breakfast.      oxyCODONE (ROXICODONE) 5 MG immediate release tablet Take 1 tablet (5 mg total) by mouth every 6 (six) hours as needed for breakthrough pain. 10 tablet 0    pantoprazole (PROTONIX) 40 MG tablet Take 1 tablet (40 mg total) by mouth daily. (Patient not taking: Reported on 01/17/2023) 30 tablet 0    pregabalin (LYRICA) 150 MG capsule Take 1 capsule (150 mg total) by mouth 2 (two) times daily. 60 capsule 0    ramelteon (ROZEREM) 8 MG tablet Take 1 tablet (8 mg total) by mouth at bedtime. (Patient not taking: Reported on 01/17/2023) 30 tablet 0    tiZANidine (ZANAFLEX) 2 MG tablet Take 4 mg by mouth every 6 (six) hours as needed for muscle spasms.       Patient Stressors:    Patient Strengths:    Treatment Modalities: Medication Management, Group therapy, Case management,  1 to 1 session with clinician, Psychoeducation, Recreational therapy.   Physician Treatment Plan for Primary Diagnosis: Polysubstance abuse (HCC) Long Term Goal(s): Improvement in symptoms so as ready for discharge   Short Term Goals: Ability to identify changes in lifestyle to reduce recurrence of condition will improve Ability to verbalize feelings will improve Ability to disclose and discuss suicidal ideas Ability to demonstrate self-control will improve Ability to identify and develop effective coping behaviors will improve Ability to maintain clinical measurements within normal limits will improve Compliance with prescribed medications will improve Ability to identify triggers associated with substance abuse/mental health issues will improve  Medication Management: Evaluate patient's response, side effects, and tolerance of medication regimen.  Therapeutic Interventions: 1 to 1 sessions, Unit Group sessions and Medication administration.  Evaluation of Outcomes: Progressing  Physician Treatment Plan for Secondary Diagnosis: Principal  Problem:   Polysubstance abuse (HCC) Active Problems:   Schizoaffective disorder (HCC)  Long Term Goal(s): Improvement in symptoms so as ready for discharge   Short Term Goals: Ability to identify changes in lifestyle to reduce recurrence of condition will improve Ability to verbalize feelings will improve Ability to disclose and discuss suicidal ideas Ability to demonstrate self-control will improve Ability to identify and develop effective coping behaviors will improve Ability to maintain clinical measurements within normal limits will improve Compliance with prescribed medications will improve Ability to identify triggers associated with substance abuse/mental health issues will improve  Medication Management: Evaluate patient's response, side effects, and tolerance of medication regimen.  Therapeutic Interventions: 1 to 1 sessions, Unit Group sessions and Medication administration.  Evaluation of Outcomes: Progressing   RN Treatment Plan for Primary Diagnosis: Polysubstance abuse (HCC) Long Term Goal(s): Knowledge of disease and therapeutic regimen to maintain health will improve  Short Term Goals: Ability to demonstrate self-control, Ability to participate in decision making will improve, Ability to verbalize feelings will improve, Ability to disclose and discuss suicidal ideas, Ability to identify and develop effective coping behaviors will improve, and Compliance with prescribed medications will improve  Medication Management: RN will administer medications as ordered by provider, will assess and evaluate patient's response and provide education to patient for prescribed medication. RN will report any adverse and/or side effects to prescribing provider.  Therapeutic Interventions: 1 on 1 counseling sessions, Psychoeducation, Medication administration, Evaluate responses to treatment, Monitor vital signs and CBGs as ordered, Perform/monitor CIWA, COWS, AIMS and Fall Risk  screenings as ordered, Perform wound care treatments as ordered.  Evaluation of Outcomes: Progressing   LCSW Treatment Plan for Primary Diagnosis: Polysubstance abuse (HCC) Long Term Goal(s): Safe transition to appropriate next level of care at discharge, Engage patient in therapeutic group addressing interpersonal concerns.  Short Term Goals: Engage patient in aftercare planning with referrals and resources, Increase social support, Increase ability to appropriately verbalize feelings, Increase emotional regulation, Facilitate acceptance of mental health diagnosis and concerns, Facilitate patient progression through stages of change regarding substance use diagnoses and concerns, Identify triggers associated with mental health/substance abuse issues, and Increase skills for wellness and recovery  Therapeutic Interventions: Assess for all discharge needs, 1 to 1 time with Social worker, Explore available resources and support systems, Assess for adequacy in community support network, Educate family and significant other(s) on suicide prevention, Complete Psychosocial Assessment, Interpersonal group therapy.  Evaluation of Outcomes: Progressing   Progress in Treatment: Attending groups: Yes. Participating in groups: Yes. Taking medication as prescribed: Yes. Toleration medication: Yes. Family/Significant other contact made: No, will contact:  once patient has provided permission. Patient understands diagnosis: Yes. Discussing patient identified problems/goals with staff: Yes. Medical problems stabilized or resolved: Yes. Denies suicidal/homicidal ideation: Yes. Issues/concerns per patient self-inventory: No. Other: none  New problem(s) identified: No, Describe:  none  New Short Term/Long Term Goal(s): detox, elimination of symptoms of psychosis, medication management for mood stabilization; elimination of SI thoughts; development of comprehensive mental wellness/sobriety plan.    Patient Goals:  "work on depression my suicide attempts, housing, pain management"  Discharge Plan or Barriers: CSW to assist in the development of appropriate discharge plans.    Reason for Continuation of Hospitalization: Anxiety Depression Medication stabilization Suicidal ideation Withdrawal symptoms  Estimated Length of Stay:  1-7 days  Last 3 [place] Suicide Severity Risk Score: Flowsheet Row Admission (Current) from 03/03/2023 in [facility]ICINE ED from 03/01/2023 in [facility] Emergency Department at [facility] ED from 02/23/2023 in [facility] Emergency Department at [facility]  C-SSRS RISK CATEGORY High Risk High Risk No Risk       Last [facility] 2/9 Scores:    02/02/2022    9:20 AM 12/20/2021   11:26 AM 11/03/2021    9:10 AM  Depression screen PHQ 2/9  Decreased Interest 0 3 0  Down, Depressed, Hopeless 0 3 0  PHQ - 2 Score 0 6 0  Altered sleeping 0 3   Tired, decreased energy 0 3   Change in appetite 0 3  Feeling bad or failure about yourself  0 3   Trouble concentrating 0 3   Moving slowly or fidgety/restless 0 0   Suicidal thoughts 0 3   PHQ-9 Score 0 24   Difficult doing work/chores Not difficult at all Very difficult     Scribe for Treatment Team: H[place] Mo, [place] 03/05/2023 10:33 AM

## 2023-03-05 NOTE — Progress Notes (Signed)
   03/05/23 1400  Spiritual Encounters  Type of Visit Initial  Care provided to: Patient  Conversation partners present during encounter Nurse  Referral source Nurse (RN/NT/LPN)  Reason for visit Routine spiritual support  OnCall Visit No  Spiritual Framework  Presenting Themes Meaning/purpose/sources of inspiration;Goals in life/care;Values and beliefs;Impactful experiences and emotions;Courage hope and growth;Coping tools  Patient Stress Factors Financial concerns;Health changes  Family Stress Factors None identified  Interventions  Spiritual Care Interventions Made Established relationship of care and support;Compassionate presence;Reflective listening;Encouragement  Intervention Outcomes  Outcomes Connection to spiritual care;Awareness around self/spiritual resourses;Awareness of support;Patient family open to resources  Spiritual Care Plan  Spiritual Care Issues Still Outstanding Chaplain will continue to follow   While rounding chaplain received a request from the nurse to speak with the patient. Chaplain met with the patient to see how she is. Patient stated that she has been homeless for 8 years and that she is also trying to get off drugs and that her knee is bad and she can barely walk. Patient cried and told the chaplain that she really needs a place to stay. She believes that is what is causing her to continue to use drugs. Chaplain encouraged the patient to talk to her social worker about housing and told her that she will inform as well. Patient asked if the chaplain can come and speak with her tomorrow. Chaplain told her she will come and follow up.

## 2023-03-05 NOTE — Group Note (Signed)
Date:  03/05/2023 Time:  5:24 PM  Group Topic/Focus:  Healthy Communication:   The focus of this group is to discuss communication, barriers to communication, as well as healthy ways to communicate with others. Outdoor recreation structured activity Outdoor therapy and exercise   Participation Level:  Active  Participation Quality:  Appropriate  Affect:  Appropriate  Cognitive:  Appropriate  Insight: Appropriate  Engagement in Group:  Engaged  Modes of Intervention:  Activity  Additional Comments:    [name] 03/05/2023, 5:24 PM

## 2023-03-06 ENCOUNTER — Encounter: Payer: Self-pay | Admitting: Psychiatry

## 2023-03-06 DIAGNOSIS — F332 Major depressive disorder, recurrent severe without psychotic features: Secondary | ICD-10-CM | POA: Diagnosis not present

## 2023-03-06 MED ORDER — ZOLPIDEM TARTRATE 5 MG PO TABS
5.0000 mg | ORAL_TABLET | Freq: Every day | ORAL | Status: DC
  Administered 2023-03-06: 5 mg via ORAL
  Filled 2023-03-06: qty 1

## 2023-03-06 MED ORDER — QUETIAPINE FUMARATE 25 MG PO TABS: 50.0000 mg | ORAL_TABLET | Freq: Every evening | ORAL | Status: DC | PRN

## 2023-03-06 MED ORDER — GABAPENTIN 100 MG PO CAPS: 100.0000 mg | ORAL_CAPSULE | Freq: Three times a day (TID) | ORAL | Status: DC

## 2023-03-06 NOTE — Progress Notes (Signed)
   03/06/23 1042  Psych Admission Type (Psych Patients Only)  Admission Status Voluntary  Psychosocial Assessment  Patient Complaints Depression  Eye Contact Brief  Facial Expression Animated  Affect Appropriate to circumstance  Speech Logical/coherent  Interaction Assertive  Motor Activity Slow  Appearance/Hygiene In scrubs  Behavior Characteristics Cooperative  Mood Pleasant  Thought Process  Coherency WDL  Content WDL  Delusions None reported or observed  Perception WDL  Hallucination None reported or observed  Judgment WDL  Confusion None  Danger to Self  Current suicidal ideation? Denies  Danger to Others  Danger to Others None reported or observed  Danger to Others Abnormal  Harmful Behavior to others No threats or harm toward other people  Destructive Behavior No threats or harm toward property

## 2023-03-06 NOTE — Group Note (Signed)
Date:  03/06/2023 Time:  9:13 PM  Group Topic/Focus:  Managing Feelings:   The focus of this group is to identify what feelings patients have difficulty handling and develop a plan to handle them in a healthier way upon discharge. Wrap-Up Group:   The focus of this group is to help patients review their daily goal of treatment and discuss progress on daily workbooks.    Participation Level:  Did Not Attend  Participation Quality:   none  Affect:   none  Cognitive:   none  Insight: None  Engagement in Group:   none  Modes of Intervention:   none  Additional Comments:  none   Pammy Vesey 03/06/2023, 9:13 PM

## 2023-03-06 NOTE — Group Note (Signed)
Date:  03/06/2023 Time:  2:14 PM  Group Topic/Focus:  Healthy Communication:   The focus of this group is to discuss communication, barriers to communication, as well as healthy ways to communicate with others. OUTDOOR RECREATION STRUCTURED ACTIVITY    Participation Level:  Did Not Attend   Victorya Hillman 03/06/2023, 2:14 PM

## 2023-03-06 NOTE — Plan of Care (Signed)
Patient alert and oriented. Patient presented in a pleasant. Denies SI, HI, AVH, and pain. Problem: Activity: Goal: Interest or engagement in activities will improve Outcome: Progressing   Problem: Coping: Goal: Ability to verbalize frustrations and anger appropriately will improve Outcome: Progressing Goal: Ability to demonstrate self-control will improve Outcome: Progressing   Problem: Education: Goal: Knowledge of [facility] Education information/materials will improve Outcome: Progressing Goal: Emotional status will improve Outcome: Progressing

## 2023-03-06 NOTE — Clinical Social Work Note (Addendum)
ADDENDUM Patient's phone screening completed. Coordinator asks that CSW and patient touch base in the morning to check if patient has been approved. CSW and patient to follow up in the morning.    CSW touched base with [facility]. Admission specialist confirmed receipt of patient's paperwork and has scheduled phone screening with patient for 3:00PM. Patient made aware. CSW to continue to assess.   Reymundo Poll, MSW, LCSWA 03/06/2023 2:52 PM

## 2023-03-06 NOTE — Clinical Social Work Note (Signed)
CSW submitted [facility] referral on patient's behalf. CSW called [facility] to confirm receipt of application and admission coordinator mentioned that it is being processed. CSW to check back soon.   Reymundo Poll, MSW, LCSWA 03/06/2023 1:11 PM

## 2023-03-06 NOTE — Group Note (Signed)
Date:  03/06/2023 Time:  12:56 AM  Group Topic/Focus:  Wrap-Up Group:   The focus of this group is to help patients review their daily goal of treatment and discuss progress on daily workbooks.    Participation Level:  Did Not Attend   Kathryn [name] 03/06/2023, 12:56 AM

## 2023-03-06 NOTE — Group Note (Signed)
LCSW Group Therapy Note  Group Date: 03/06/2023 Start Time: 1300 End Time: 1400   Type of Therapy and Topic:  Group Therapy: Anger Cues and Responses  Participation Level:  Did Not Attend   Description of Group:   In this group, patients learned how to recognize the physical, cognitive, emotional, and behavioral responses they have to anger-provoking situations.  They identified a recent time they became angry and how they reacted.  They analyzed how their reaction was possibly beneficial and how it was possibly unhelpful.  The group discussed a variety of healthier coping skills that could help with such a situation in the future.  Focus was placed on how helpful it is to recognize the underlying emotions to our anger, because working on those can lead to a more permanent solution as well as our ability to focus on the important rather than the urgent.  Therapeutic Goals: Patients will remember their last incident of anger and how they felt emotionally and physically, what their thoughts were at the time, and how they behaved. Patients will identify how their behavior at that time worked for them, as well as how it worked against them. Patients will explore possible new behaviors to use in future anger situations. Patients will learn that anger itself is normal and cannot be eliminated, and that healthier reactions can assist with resolving conflict rather than worsening situations.  Summary of Patient Progress:   X  Therapeutic Modalities:   Cognitive Behavioral Therapy    Harden Mo, LCSWA 03/06/2023  2:50 PM

## 2023-03-06 NOTE — Group Note (Signed)
Date:  03/06/2023 Time:  10:18 AM  Group Topic/Focus:  Goals Group:   The focus of this group is to help patients establish daily goals to achieve during treatment and discuss how the patient can incorporate goal setting into their daily lives to aide in recovery.    Participation Level:  Active  Participation Quality:  Appropriate  Affect:  Appropriate  Cognitive:  Appropriate  Insight: Appropriate  Engagement in Group:  Engaged  Modes of Intervention:  Discussion, Education, and Support  Additional Comments:    Wilford Corner 03/06/2023, 10:18 AM

## 2023-03-06 NOTE — Group Note (Signed)
Recreation Therapy Group Note   Group Topic:Problem Solving  Group Date: 03/06/2023 Start Time: 1005 End Time: 1100 Facilitators: Rosina Lowenstein, LRT, CTRS Location:  Craft Room  Group Description: Life Boat. Patients were given the scenario that they are on a boat that is about to become shipwrecked, leaving them stranded on an [place]. They are asked to make a list of 15 different items that they want to take with them when they are stranded on the [place]. Patients are asked to rank their items from most important to least important, #1 being the most important and #15 being the least. Patients will work individually for the first round to come up with 15 items and then pair up with a peer(s) to condense their list and come up with one list of 15 items between the two of them. Patients or LRT will read aloud the 15 different items to the group after each round. LRT facilitated post-activity processing to discuss how this activity can be used in daily life post discharge.   Goal Area(s) Addressed:  Patient will identify priorities, wants and needs. Patient will communicate with LRT and peers. Patient will work collectively as a Administrator, Civil Service. Patient will work on Product manager.    Affect/Mood: Appropriate   Participation Level: Active and Engaged   Participation Quality: Independent   Behavior: Appropriate, Calm, and Cooperative   Speech/Thought Process: Coherent   Insight: Good and Improved   Judgement: Good   Modes of Intervention: Problem-solving and Team-building   Patient Response to Interventions:  Attentive, Engaged, Interested , and Receptive   Education Outcome:  Acknowledges education   Clinical Observations/Individualized Feedback: Kathryn [name] was active in their participation of session activities and group discussion. Pt identified clothes, medication box, personal medicine, blankets, shoes, a radio" as things she wants to bring with her on the [place]. Pt  brought up the importance of staying on medications. Pt interacted well with LRT and peers duration of session.    Plan: Continue to engage patient in RT group sessions 2-3x/week.   Rosina Lowenstein, LRT, CTRS 03/06/2023 11:58 AM

## 2023-03-06 NOTE — Progress Notes (Signed)
[facility] MD Progress Note  03/06/2023 6:46 AM Kathryn [name]  MRN:  [id number]  Subjective:  Pt chart reviewed, discussed with interdisciplinary team, and seen on rounds. When asked about her depression, she stated, "It's all out of whack", interpreting as not good, denies suicidal ideations.  Anxiety "is way out of whack", no panic attacks.  She is hoping to get to [facility] to continue her recovery.  Sleep was not good last night and when asked what worked for her in the past, she said, "Seroquel".  When pointed out it was listed as an allergy, she adamantly denied this, specifically asked if she had issued breathing and she said no. Will order one time dose of Ambien to avoid any possible side effects.  Appetite is improving, denies withdrawal symptoms.     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, insomnia, generalized anxiety disorder, major depressive disorder polysubstance abuse  Past Medical History:  Past Medical History:  Diagnosis Date   Anxiety    Arthritis    Asthma    Chronic back pain    COPD (chronic obstructive pulmonary disease) (HCC)    Chronic bronchitis   Depression    Dyspnea    GERD (gastroesophageal reflux disease)    Hypertension    Migraine    Neuropathy    Osteoarthritis of left knee, 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   Single subsegmental pulmonary embolism without acute cor pulmonale (HCC) 06/20/2021   Sleep apnea 04/2020   GETTING A cpap   Suicidal ideation 12/24/2018    Past Surgical History:  Procedure Laterality Date   BACK SURGERY     DILATATION AND CURETTAGE/HYSTEROSCOPY WITH MINERVA N/A 06/09/2020   Procedure: DILATATION AND CURETTAGE/HYSTEROSCOPY WITH MINERVA;   Surgeon: Lazaro Arms, MD;  Location: [facility];  Service: Gynecology;  Laterality: N/A;   ESOPHAGOGASTRODUODENOSCOPY (EGD) WITH PROPOFOL N/A 12/25/2018   Procedure: ESOPHAGOGASTRODUODENOSCOPY (EGD) WITH PROPOFOL;  Surgeon: Malissa Hippo, MD;  Location: [facility];  Service: Endoscopy;  Laterality: N/A;   FLEXIBLE SIGMOIDOSCOPY  10/14/2021   Procedure: FLEXIBLE SIGMOIDOSCOPY;  Surgeon: Dolores Frame, MD;  Location: [facility];  Service: Gastroenterology;;   PATELLA-FEMORAL ARTHROPLASTY Left 10/08/2018   Procedure: PATELLA-FEMORAL ARTHROPLASTY;  Surgeon: Teryl Lucy, MD;  Location: [facility];  Service: Orthopedics;  Laterality: Left;   TUBAL LIGATION     Family History:  Family History  Problem Relation Age of Onset   Gout Paternal Grandfather    Cirrhosis Paternal Grandfather    Hypertension Paternal Grandmother    Aneurysm Paternal Grandmother    Cirrhosis Maternal Grandmother    Cirrhosis Maternal Grandfather    Cancer Father    Cirrhosis Father    Cirrhosis Mother    Breast cancer Sister    Hypertension Sister    Bronchitis Daughter    Bronchitis Daughter    Asthma Son    Bronchitis Son    Migraines Neg Hx    Family Psychiatric  History: None reported Social History:  Social History   Substance and Sexual Activity  Alcohol Use Not Currently   Comment: states quit  06/29/22     Social History   Substance and Sexual Activity  Drug Use Yes   Types: Cocaine   Comment: last used 05/2022  Social History   Socioeconomic History   Marital status: Single    Spouse name: Not on file   Number of children: 3   Years of education: 10   Highest education level: 10th grade  Occupational History   Not on file  Tobacco Use   Smoking status: Former    Current packs/day: 0.00    Average packs/day: 0.3 packs/day for 10.0 years (2.5 ttl pk-yrs)    Types: Cigarettes    Start date: 04/2010    Quit date: 04/2020    Years since quitting: 2.8    Passive  exposure: Never   Smokeless tobacco: Never   Tobacco comments:    Smokes often, especially when using crack cocaine  Vaping Use   Vaping status: Never Used  Substance and Sexual Activity   Alcohol use: Not Currently    Comment: states quit  06/29/22   Drug use: Yes    Types: Cocaine    Comment: last used 05/2022   Sexual activity: Yes    Birth control/protection: Surgical    Comment: tubal, ablation  Other Topics Concern   Not on file  Social History Narrative   R handed    Lives with boyfriend   1 Cup of caffeine daily    Social Determinants of Health   Financial Resource Strain: Not on file  Food Insecurity: Food Insecurity Present (03/03/2023)   Hunger Vital Sign    Worried About Programme researcher, broadcasting/film/video in the Last Year: Often true    Ran Out of Food in the Last Year: Often true  Transportation Needs: Unmet Transportation Needs (03/03/2023)   PRAPARE - Administrator, Civil Service (Medical): Yes    Lack of Transportation (Non-Medical): Yes  Physical Activity: Not on file  Stress: Not on file  Social Connections: Unknown (08/30/2021)   Received from [employer], [facility]   Social Network    Social Network: Not on file   Additional Social History: homeless   Sleep: Poor  Appetite:  Good  Current Medications: Current Facility-Administered Medications  Medication Dose Route Frequency Provider Last Rate Last Admin   alum & mag hydroxide-simeth (MAALOX/MYLANTA) 200-200-20 MG/5ML suspension 30 mL  30 mL Oral Q4H PRN Bobbitt, Shalon E, NP   30 mL at 03/05/23 1212   amLODipine (NORVASC) tablet 5 mg  5 mg Oral Daily Jearld Lesch, NP   5 mg at 03/05/23 1610   aspirin chewable tablet 81 mg  81 mg Oral Daily Dixon, Rashaun M, NP   81 mg at 03/05/23 9604   haloperidol lactate (HALDOL) injection 5 mg  5 mg Intramuscular TID PRN Bobbitt, Shalon E, NP       And   diphenhydrAMINE (BENADRYL) injection 50 mg  50 mg Intramuscular TID PRN Bobbitt, Shalon E, NP        And   LORazepam (ATIVAN) injection 2 mg  2 mg Intramuscular TID PRN Bobbitt, Shalon E, NP       DULoxetine (CYMBALTA) DR capsule 90 mg  90 mg Oral Daily Myriam Forehand, NP   90 mg at 03/05/23 0842   gabapentin (NEURONTIN) capsule 300 mg  300 mg Oral Daily Dixon, Rashaun M, NP   300 mg at 03/05/23 0843   lidocaine (LIDODERM) 5 % 1 patch  1 patch Transdermal Q24H Jearld Lesch, NP   1 patch at 03/05/23 1815   magnesium hydroxide (MILK OF MAGNESIA) suspension 30 mL  30 mL Oral Daily PRN Bobbitt, Shalon E,  NP       melatonin tablet 5 mg  5 mg Oral QHS PRN Lauree Chandler, NP       nicotine polacrilex (NICORETTE) gum 2 mg  2 mg Oral PRN Myriam Forehand, NP       OLANZapine zydis (ZYPREXA) disintegrating tablet 5 mg  5 mg Oral TID PRN Jearld Lesch, NP       ondansetron (ZOFRAN-ODT) disintegrating tablet 4 mg  4 mg Oral Q6H PRN Bobbitt, Shalon E, NP       pantoprazole (PROTONIX) EC tablet 40 mg  40 mg Oral Daily Lauree Chandler, NP   40 mg at 03/05/23 1622   pregabalin (LYRICA) capsule 150 mg  150 mg Oral BID Lerry Liner M, NP   150 mg at 03/05/23 1622   tiZANidine (ZANAFLEX) tablet 4 mg  4 mg Oral Q6H PRN Jearld Lesch, NP   4 mg at 03/04/23 1740    Lab Results: No results found for this or any previous visit (from the past 48 hour(s)).  Blood Alcohol level:  Lab Results  Component Value Date   ETH <10 03/01/2023   ETH <10 06/29/2022    Metabolic Disorder Labs: Lab Results  Component Value Date   HGBA1C 5.5 01/10/2022   MPG 111.15 01/10/2022   MPG 111.15 10/19/2021   No results found for: "PROLACTIN" Lab Results  Component Value Date   CHOL 168 01/10/2022   TRIG 56 01/10/2022   HDL 57 01/10/2022   CHOLHDL 2.9 01/10/2022   VLDL 11 01/10/2022   LDLCALC 100 (H) 01/10/2022   LDLCALC 84 10/19/2021    Physical Findings: AIMS:  , ,  ,  ,    CIWA:    COWS:  COWS Total Score: 0  Musculoskeletal: Strength & Muscle Tone:  ambulates with walker on unit Gait &  Station:  ambulates with walker on unit Patient leans:  ambulates with walker on unit  Psychiatric Specialty Exam: Physical Exam Vitals and nursing note reviewed.  Constitutional:      General: She is not in acute distress.    Appearance: Normal appearance. She is not ill-appearing, toxic-appearing or diaphoretic.  HENT:     Head: Normocephalic.     Nose: Nose normal.  Eyes:     General: No scleral icterus. Pulmonary:     Effort: Pulmonary effort is normal. No respiratory distress.  Musculoskeletal:        General: Normal range of motion.     Cervical back: Normal range of motion.  Neurological:     General: No focal deficit present.     Mental Status: She is alert and oriented to person, place, and time.  Psychiatric:        Attention and Perception: Attention and perception normal.        Mood and Affect: Mood is anxious. Affect is blunt.        Speech: Speech normal.        Behavior: Behavior normal. Behavior is cooperative.        Thought Content: Thought content normal.        Cognition and Memory: Cognition and memory normal.        Judgment: Judgment normal.     Review of Systems  Constitutional:  Negative for chills and fever.  Respiratory:  Negative for shortness of breath.   Cardiovascular:  Negative for chest pain and palpitations.  Gastrointestinal:  Negative for abdominal pain.  Psychiatric/Behavioral:  Positive for depression and substance  abuse. The patient is nervous/anxious and has insomnia.   All other systems reviewed and are negative.   Blood pressure (!) 136/92, pulse 77, temperature 97.7 F (36.5 C), resp. rate 19, height 4\' 11"  (1.499 m), weight 73.9 kg, last menstrual period 06/07/2020, SpO2 97%.Body mass index is 32.92 kg/m.  General Appearance: Casual  Eye Contact:  Fair  Speech:  Normal Rate  Volume:  Normal  Mood:  Anxious and Depressed  Affect:  Congruent  Thought Process:  Coherent  Orientation:  Full (Time, Place, and Person)  Thought  Content:  Logical  Suicidal Thoughts:  No  Homicidal Thoughts:  No  Memory:  Immediate;   Fair Recent;   Fair Remote;   Fair  Judgement:  Good  Insight:  Fair  Psychomotor Activity:  Normal  Concentration:  Concentration: Fair and Attention Span: Fair  Recall:  Good  Fund of Knowledge:  Good  Language:  Good  Akathisia:  No  Handed:  Right  AIMS (if indicated):     Assets:  Leisure Time Physical Health Resilience Social Support  ADL's:  Intact  Cognition:  WNL  Sleep:        Physical Exam: Physical Exam Vitals and nursing note reviewed.  Constitutional:      General: She is not in acute distress.    Appearance: Normal appearance. She is not ill-appearing, toxic-appearing or diaphoretic.  HENT:     Head: Normocephalic.     Nose: Nose normal.  Eyes:     General: No scleral icterus. Pulmonary:     Effort: Pulmonary effort is normal. No respiratory distress.  Musculoskeletal:        General: Normal range of motion.     Cervical back: Normal range of motion.  Neurological:     General: No focal deficit present.     Mental Status: She is alert and oriented to person, place, and time.  Psychiatric:        Attention and Perception: Attention and perception normal.        Mood and Affect: Mood is anxious. Affect is blunt.        Speech: Speech normal.        Behavior: Behavior normal. Behavior is cooperative.        Thought Content: Thought content normal.        Cognition and Memory: Cognition and memory normal.        Judgment: Judgment normal.    Review of Systems  Constitutional:  Negative for chills and fever.  Respiratory:  Negative for shortness of breath.   Cardiovascular:  Negative for chest pain and palpitations.  Gastrointestinal:  Negative for abdominal pain.  Psychiatric/Behavioral:  Positive for depression and substance abuse. The patient is nervous/anxious and has insomnia.   All other systems reviewed and are negative.  Blood pressure (!) 136/92,  pulse 77, temperature 97.7 F (36.5 C), resp. rate 19, height 4\' 11"  (1.499 m), weight 73.9 kg, last menstrual period 06/07/2020, SpO2 97%. Body mass index is 32.92 kg/m.   Treatment Plan Summary: Daily contact with patient to assess and evaluate symptoms and progress in treatment, Medication management, and Plan   Major depressive disorder, recurrent, severe without psychosis: -Cymbalta 90mg  oral daily for depression, anxiety, and pain  General anxiety disorder: -Gabapentin 300mg  oral daily for anxiety, pain  Agitation: -Continue haldol 5mg  IM 3 times daily PRN agitation, moderate agitation AND benadryl 50mg  IM 3 times daily PRN moderate agitation AND ativan 2mg  IM 3 times daily PRN  moderate agitation -Continue zyprexa 5mg  oral 3 times daily PRN mild agitation  Insomnia: Discontinue melatonin Started Ambien 5 mg daily at bedtime   Nanine Means, NP 03/06/2023, 6:46 AM

## 2023-03-07 ENCOUNTER — Other Ambulatory Visit: Payer: Self-pay | Admitting: Internal Medicine

## 2023-03-07 DIAGNOSIS — F332 Major depressive disorder, recurrent severe without psychotic features: Secondary | ICD-10-CM | POA: Diagnosis not present

## 2023-03-07 MED ORDER — QUETIAPINE FUMARATE 25 MG PO TABS
25.0000 mg | ORAL_TABLET | Freq: Once | ORAL | Status: AC
  Administered 2023-03-07: 25 mg via ORAL
  Filled 2023-03-07: qty 1

## 2023-03-07 MED ORDER — QUETIAPINE FUMARATE 25 MG PO TABS: 50.0000 mg | ORAL_TABLET | Freq: Every day | ORAL | Status: DC

## 2023-03-07 MED ORDER — DIPHENHYDRAMINE HCL 50 MG/ML IJ SOLN: 50.0000 mg | Freq: Once | INTRAMUSCULAR | Status: DC | PRN

## 2023-03-07 NOTE — Progress Notes (Signed)
.  D- Patient alert and oriented.. Denies SI, HI, AVH, and pain. Received Zyprexa and melatonin prn.  A- Scheduled medications administered to patient, per MD orders. Support and encouragement provided.  Routine safety checks conducted every 15 minutes.  Patient informed to notify staff with problems or concerns. R- No adverse drug reactions noted. Patient contracts for safety at this time. Patient compliant with medications and treatment plan. Patient receptive, calm, and cooperative. Patient interacts well with others on the unit.  Patient remains safe at this time.   03/06/23 2200  Psych Admission Type (Psych Patients Only)  Admission Status Voluntary  Psychosocial Assessment  Patient Complaints None  Eye Contact Fair  Facial Expression Other (Comment) (wnl)  Affect Appropriate to circumstance  Speech Logical/coherent  Interaction Assertive  Motor Activity Slow  Appearance/Hygiene In scrubs  Behavior Characteristics Cooperative;Appropriate to situation  Mood Pleasant  Thought Process  Coherency WDL  Content WDL  Delusions None reported or observed  Perception WDL  Hallucination None reported or observed  Judgment WDL  Confusion None  Danger to Self  Current suicidal ideation? Denies  Danger to Others  Danger to Others None reported or observed  Danger to Others Abnormal  Harmful Behavior to others No threats or harm toward other people  Destructive Behavior No threats or harm toward property

## 2023-03-07 NOTE — Group Note (Signed)
Recreation Therapy Group Note   Group Topic:Emotion Expression  Group Date: 03/07/2023 Start Time: 1000 End Time: 1100 Facilitators: Rosina Lowenstein, LRT, CTRS Location:  Craft Room  Group Description: Positivity Collage. LRT and patients discussed the importance of having a positive mindset and being happy. Patients received magazines, safety scissors, a glue stick and a piece of paper. Pts were encouraged to find images or words in the magazines that showed "happiness" or positivity to them. Pt shared their collage with the group once they were finished. LRT and pts discussed how it can be difficult to always have a positive mindset, especially when they have mental health challenges.   Goal Area(s) Addressed:  Pt will identify things associate with positivity. Pt will reduce negative thinking. Pt will identify a new coping skill of thinking positive thoughts.    Affect/Mood: N/A   Participation Level: Did not attend    Clinical Observations/Individualized Feedback: Emmogene did not attend group.   Plan: Continue to engage patient in RT group sessions 2-3x/week.   Rosina Lowenstein, LRT, CTRS 03/07/2023 12:05 PM

## 2023-03-07 NOTE — Progress Notes (Signed)
[place] [facility] MD Progress Note  03/07/2023 6:51 AM Kathryn [name]  MRN:  [id number]  Subjective:  Pt chart reviewed, discussed with interdisciplinary team, and seen on rounds  Client resting in bed, when approached she is irritated to answer questions and states "Jesus."  Client reports getting no sleep last night and is trying to sleep now. Anxiety is "ok". Depression is "bad", reports no suicide ideation. Client's appetite is unchanged from yesterday, stating it is "ok".  Denies homicidal ideation, hallucinations and paranoia. Denies medication side effects at this time.  [facility] has accepted her for Friday am.    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, insomnia, generalized anxiety disorder, major depressive disorder polysubstance abuse  Past Medical History:  Past Medical History:  Diagnosis Date   Anxiety    Arthritis    Asthma    Chronic back pain    COPD (chronic obstructive pulmonary disease) (HCC)    Chronic bronchitis   Depression    Dyspnea    GERD (gastroesophageal reflux disease)    Hypertension    Migraine    Neuropathy    Osteoarthritis of left knee, 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   Single subsegmental pulmonary embolism without acute cor pulmonale (HCC) 06/20/2021   Sleep apnea 04/2020   GETTING A cpap   Suicidal ideation 12/24/2018    Past Surgical History:  Procedure Laterality Date   BACK SURGERY     DILATATION AND CURETTAGE/HYSTEROSCOPY WITH MINERVA N/A 06/09/2020   Procedure: DILATATION AND CURETTAGE/HYSTEROSCOPY WITH MINERVA;  Surgeon: Lazaro Arms, MD;  Location: [facility];  Service: Gynecology;  Laterality: N/A;    ESOPHAGOGASTRODUODENOSCOPY (EGD) WITH PROPOFOL N/A 12/25/2018   Procedure: ESOPHAGOGASTRODUODENOSCOPY (EGD) WITH PROPOFOL;  Surgeon: Malissa Hippo, MD;  Location: [facility];  Service: Endoscopy;  Laterality: N/A;   FLEXIBLE SIGMOIDOSCOPY  10/14/2021   Procedure: FLEXIBLE SIGMOIDOSCOPY;  Surgeon: Dolores Frame, MD;  Location: [facility];  Service: Gastroenterology;;   PATELLA-FEMORAL ARTHROPLASTY Left 10/08/2018   Procedure: PATELLA-FEMORAL ARTHROPLASTY;  Surgeon: Teryl Lucy, MD;  Location: [facility];  Service: Orthopedics;  Laterality: Left;   TUBAL LIGATION     Family History:  Family History  Problem Relation Age of Onset   Gout Paternal Grandfather    Cirrhosis Paternal Grandfather    Hypertension Paternal Grandmother    Aneurysm Paternal Grandmother    Cirrhosis Maternal Grandmother    Cirrhosis Maternal Grandfather    Cancer Father    Cirrhosis Father    Cirrhosis Mother    Breast cancer Sister    Hypertension Sister    Bronchitis Daughter    Bronchitis Daughter    Asthma Son    Bronchitis Son    Migraines Neg Hx    Family Psychiatric  History: None reported Social History:  Social History   Substance and Sexual Activity  Alcohol Use Not Currently   Comment: states quit  06/29/22     Social History   Substance and Sexual Activity  Drug Use Yes   Types: Cocaine   Comment: last used 05/2022    Social History   Socioeconomic History   Marital status: Single    Spouse name: Not on file   Number of children: 3   Years  of education: 10   Highest education level: 10th grade  Occupational History   Not on file  Tobacco Use   Smoking status: Former    Current packs/day: 0.00    Average packs/day: 0.3 packs/day for 10.0 years (2.5 ttl pk-yrs)    Types: Cigarettes    Start date: 04/2010    Quit date: 04/2020    Years since quitting: 2.8    Passive exposure: Never   Smokeless tobacco: Never   Tobacco comments:    Smokes often, especially  when using crack cocaine  Vaping Use   Vaping status: Never Used  Substance and Sexual Activity   Alcohol use: Not Currently    Comment: states quit  06/29/22   Drug use: Yes    Types: Cocaine    Comment: last used 05/2022   Sexual activity: Yes    Birth control/protection: Surgical    Comment: tubal, ablation  Other Topics Concern   Not on file  Social History Narrative   R handed    Lives with boyfriend   1 Cup of caffeine daily    Social Determinants of Health   Financial Resource Strain: Not on file  Food Insecurity: Food Insecurity Present (03/03/2023)   Hunger Vital Sign    Worried About Programme researcher, broadcasting/film/video in the Last Year: Often true    Ran Out of Food in the Last Year: Often true  Transportation Needs: Unmet Transportation Needs (03/03/2023)   PRAPARE - Administrator, Civil Service (Medical): Yes    Lack of Transportation (Non-Medical): Yes  Physical Activity: Not on file  Stress: Not on file  Social Connections: Unknown (08/30/2021)   Received from [employer], [facility]   Social Network    Social Network: Not on file   Additional Social History: homeless   Sleep: Poor  Appetite:  Good  Current Medications: Current Facility-Administered Medications  Medication Dose Route Frequency Provider Last Rate Last Admin   alum & mag hydroxide-simeth (MAALOX/MYLANTA) 200-200-20 MG/5ML suspension 30 mL  30 mL Oral Q4H PRN Bobbitt, Shalon E, NP   30 mL at 03/05/23 1212   amLODipine (NORVASC) tablet 5 mg  5 mg Oral Daily Jearld Lesch, NP   5 mg at 03/06/23 1610   aspirin chewable tablet 81 mg  81 mg Oral Daily Dixon, Rashaun M, NP   81 mg at 03/06/23 9604   haloperidol lactate (HALDOL) injection 5 mg  5 mg Intramuscular TID PRN Bobbitt, Shalon E, NP       And   diphenhydrAMINE (BENADRYL) injection 50 mg  50 mg Intramuscular TID PRN Bobbitt, Shalon E, NP       And   LORazepam (ATIVAN) injection 2 mg  2 mg Intramuscular TID PRN Bobbitt, Shalon E, NP        DULoxetine (CYMBALTA) DR capsule 90 mg  90 mg Oral Daily Myriam Forehand, NP   90 mg at 03/06/23 0904   lidocaine (LIDODERM) 5 % 1 patch  1 patch Transdermal Q24H Jearld Lesch, NP   1 patch at 03/06/23 1745   magnesium hydroxide (MILK OF MAGNESIA) suspension 30 mL  30 mL Oral Daily PRN Bobbitt, Shalon E, NP       melatonin tablet 5 mg  5 mg Oral QHS PRN Lauree Chandler, NP   5 mg at 03/06/23 2315   nicotine polacrilex (NICORETTE) gum 2 mg  2 mg Oral PRN Myriam Forehand, NP  OLANZapine zydis (ZYPREXA) disintegrating tablet 5 mg  5 mg Oral TID PRN Jearld Lesch, NP   5 mg at 03/06/23 2315   ondansetron (ZOFRAN-ODT) disintegrating tablet 4 mg  4 mg Oral Q6H PRN Bobbitt, Shalon E, NP       pantoprazole (PROTONIX) EC tablet 40 mg  40 mg Oral Daily Lauree Chandler, NP   40 mg at 03/06/23 0904   pregabalin (LYRICA) capsule 150 mg  150 mg Oral BID Lerry Liner M, NP   150 mg at 03/06/23 1746   tiZANidine (ZANAFLEX) tablet 4 mg  4 mg Oral Q6H PRN Jearld Lesch, NP   4 mg at 03/07/23 0209   zolpidem (AMBIEN) tablet 5 mg  5 mg Oral QHS Charm Rings, NP   5 mg at 03/06/23 2119    Lab Results: No results found for this or any previous visit (from the past 48 hour(s)).  Blood Alcohol level:  Lab Results  Component Value Date   ETH <10 03/01/2023   ETH <10 06/29/2022    Metabolic Disorder Labs: Lab Results  Component Value Date   HGBA1C 5.5 01/10/2022   MPG 111.15 01/10/2022   MPG 111.15 10/19/2021   No results found for: "PROLACTIN" Lab Results  Component Value Date   CHOL 168 01/10/2022   TRIG 56 01/10/2022   HDL 57 01/10/2022   CHOLHDL 2.9 01/10/2022   VLDL 11 01/10/2022   LDLCALC 100 (H) 01/10/2022   LDLCALC 84 10/19/2021    Physical Findings: AIMS:  , ,  ,  ,    CIWA:    COWS:  COWS Total Score: 0  Musculoskeletal: Strength & Muscle Tone:  ambulates with walker on unit Gait & Station:  ambulates with walker on unit Patient leans:  ambulates with  walker on unit  .Psychiatric Specialty Exam: Physical Exam Vitals and nursing note reviewed.  Constitutional:      General: She is not in acute distress.    Appearance: Normal appearance. She is not ill-appearing, toxic-appearing or diaphoretic.  HENT:     Head: Normocephalic.     Nose: Nose normal.  Eyes:     General: No scleral icterus. Pulmonary:     Effort: Pulmonary effort is normal. No respiratory distress.  Musculoskeletal:        General: Normal range of motion.     Cervical back: Normal range of motion.  Neurological:     General: No focal deficit present.     Mental Status: She is alert and oriented to person, place, and time.  Psychiatric:        Attention and Perception: Attention and perception normal.        Mood and Affect: Mood is anxious. Affect is blunt.        Speech: Speech normal.        Behavior: Behavior normal. Behavior is cooperative.        Thought Content: Thought content normal.        Cognition and Memory: Cognition and memory normal.        Judgment: Judgment normal.     Review of Systems  Constitutional:  Negative for chills and fever.  Respiratory:  Negative for shortness of breath.   Cardiovascular:  Negative for chest pain and palpitations.  Gastrointestinal:  Negative for abdominal pain.  Psychiatric/Behavioral:  Positive for depression, sleep disturbance and substance abuse. The patient is nervous/anxious and has insomnia.   All other systems reviewed and are negative.   Blood  pressure 137/80, pulse 89, temperature 97.6 F (36.4 C), resp. rate 17, height 4\' 11"  (1.499 m), weight 73.9 kg, last menstrual period 06/07/2020, SpO2 99%.Body mass index is 32.92 kg/m.  General Appearance: Fairly Groomed  Eye Contact:  Fair  Speech:  Normal Rate  Volume:  Normal  Mood:  Depressed  Affect:  Depressed and Flat  Thought Process:  Coherent  Orientation:  Full (Time, Place, and Person)  Thought Content:  WDL  Suicidal Thoughts:  Yes.  without  intent/plan  Homicidal Thoughts:  No  Memory:  Immediate;   Fair Recent;   Fair Remote;   Fair  Judgement:  Fair  Insight:  Fair  Psychomotor Activity:  Normal  Concentration:  Concentration: Fair and Attention Span: Fair  Recall:  [employer] of Knowledge:  Fair  Language:  Good  Akathisia:  NA  Handed:  Right  AIMS (if indicated):     Assets:  Desire for Improvement Resilience  ADL's:  Intact  Cognition:  WNL  Sleep:  Poor    Physical Exam: Physical Exam Vitals and nursing note reviewed.  Constitutional:      General: She is not in acute distress.    Appearance: Normal appearance. She is not ill-appearing, toxic-appearing or diaphoretic.  HENT:     Head: Normocephalic.     Nose: Nose normal.  Eyes:     General: No scleral icterus. Pulmonary:     Effort: Pulmonary effort is normal. No respiratory distress.  Musculoskeletal:        General: Normal range of motion.     Cervical back: Normal range of motion.  Neurological:     General: No focal deficit present.     Mental Status: She is alert and oriented to person, place, and time.  Psychiatric:        Attention and Perception: Attention and perception normal.        Mood and Affect: Mood is anxious. Affect is blunt.        Speech: Speech normal.        Behavior: Behavior normal. Behavior is cooperative.        Thought Content: Thought content normal.        Cognition and Memory: Cognition and memory normal.        Judgment: Judgment normal.    Review of Systems  Constitutional:  Negative for chills and fever.  Respiratory:  Negative for shortness of breath.   Cardiovascular:  Negative for chest pain and palpitations.  Gastrointestinal:  Negative for abdominal pain.  Psychiatric/Behavioral:  Positive for depression, sleep disturbance and substance abuse. The patient is nervous/anxious and has insomnia.   All other systems reviewed and are negative.  Blood pressure 137/80, pulse 89, temperature 97.6 F (36.4  C), resp. rate 17, height 4\' 11"  (1.499 m), weight 73.9 kg, last menstrual period 06/07/2020, SpO2 99%. Body mass index is 32.92 kg/m.   Treatment Plan Summary: Daily contact with patient to assess and evaluate symptoms and progress in treatment, Medication management, and Plan   Major depressive disorder, recurrent, severe without psychosis: -Cymbalta 90mg  oral daily for depression, anxiety, and pain  General anxiety disorder: -Gabapentin 300mg  oral daily for anxiety, pain  Agitation: -Continue haldol 5mg  IM 3 times daily PRN agitation, moderate agitation AND benadryl 50mg  IM 3 times daily PRN moderate agitation AND ativan 2mg  IM 3 times daily PRN moderate agitation -Continue zyprexa 5mg  oral 3 times daily PRN mild agitation  Insomnia: Discontinue Ambien 5 mg daily at bedtime  Seroquel 25 mg once to test for allergies, Benadryl in place.  She swears she is not allergic.  If no reaction, will start Seroquel 50 mg at bedtime for sleep per her request.   Nanine Means, NP 03/07/2023, 6:51 AM

## 2023-03-07 NOTE — Plan of Care (Signed)
Problem: Education: Goal: Knowledge of [facility] Education information/materials will improve Outcome: Progressing   Problem: Activity: Goal: Interest or engagement in activities will improve Outcome: Progressing Goal: Sleeping patterns will improve Outcome: Progressing   Problem: Coping: Goal: Ability to verbalize frustrations and anger appropriately will improve Outcome: Progressing

## 2023-03-07 NOTE — Group Note (Unsigned)
Date:  03/08/2023 Time:  12:06 AM  Group Topic/Focus:  Wrap-Up Group:   The focus of this group is to help patients review their daily goal of treatment and discuss progress on daily workbooks.    Participation Level:  Did Not Attend  Lenore Cordia 03/08/2023, 12:06 AM

## 2023-03-07 NOTE — Progress Notes (Signed)
Patient isolative to self and room. No interaction with peers. Minimal with staff. Denies SI, Hi, AVH. Endorses depression and anxiety. Stays in room most of shift. Encouragement and support provided. Safety checks maintained. Meds given as prescribed. Pt receptive and remains safe on unit with q 15 min checks.

## 2023-03-07 NOTE — Plan of Care (Signed)
Patient pleasant and cooperative on approach. Patient stated that her anxiety and depression better. Denies SI,HI and AVH. Patient ambulates with walker. Appetite and energy level good. Support and encouragement given.

## 2023-03-07 NOTE — Plan of Care (Signed)
°  Problem: Education: °Goal: Emotional status will improve °Outcome: Progressing °Goal: Mental status will improve °Outcome: Progressing °Goal: Verbalization of understanding the information provided will improve °Outcome: Progressing °  °

## 2023-03-07 NOTE — Group Note (Signed)
Date:  03/07/2023 Time:  11:27 AM  Group Topic/Focus:  Goals Group:   The focus of this group is to help patients establish daily goals to achieve during treatment and discuss how the patient can incorporate goal setting into their daily lives to aide in recovery.   Participation Level:  Did Not Attend   Arrie Zuercher A Jamareon Shimel 03/07/2023, 11:27 AM

## 2023-03-08 ENCOUNTER — Other Ambulatory Visit: Payer: Self-pay

## 2023-03-08 DIAGNOSIS — F332 Major depressive disorder, recurrent severe without psychotic features: Secondary | ICD-10-CM | POA: Diagnosis not present

## 2023-03-08 LAB — HEMOGLOBIN A1C
Hgb A1c MFr Bld: 5.8 % — ABNORMAL HIGH (ref 4.8–5.6)
Mean Plasma Glucose: 119.76 mg/dL

## 2023-03-08 LAB — LIPID PANEL
Cholesterol: 152 mg/dL (ref 0–200)
HDL: 47 mg/dL (ref >40)
LDL Cholesterol: 73 mg/dL (ref 0–99)
Total CHOL/HDL Ratio: 3.2 {ratio}
Triglycerides: 158 mg/dL — ABNORMAL HIGH (ref <150)
VLDL: 32 mg/dL (ref 0–40)

## 2023-03-08 LAB — TSH: TSH: 0.918 u[IU]/mL (ref 0.350–4.500)

## 2023-03-08 MED ORDER — QUETIAPINE FUMARATE 25 MG PO TABS
50.0000 mg | ORAL_TABLET | Freq: Every day | ORAL | Status: DC
  Administered 2023-03-08: 50 mg via ORAL
  Filled 2023-03-08: qty 2

## 2023-03-08 MED ORDER — QUETIAPINE FUMARATE 50 MG PO TABS
50.0000 mg | ORAL_TABLET | Freq: Every day | ORAL | 0 refills | Status: DC
  Filled 2023-03-08: qty 10, 10d supply, fill #0

## 2023-03-08 MED ORDER — GABAPENTIN 300 MG PO CAPS
300.0000 mg | ORAL_CAPSULE | Freq: Every day | ORAL | 0 refills | Status: DC
  Filled 2023-03-08: qty 10, 10d supply, fill #0

## 2023-03-08 MED ORDER — AMLODIPINE BESYLATE 5 MG PO TABS
5.0000 mg | ORAL_TABLET | Freq: Every day | ORAL | 0 refills | Status: DC
  Filled 2023-03-08: qty 10, 10d supply, fill #0

## 2023-03-08 MED ORDER — DULOXETINE HCL 30 MG PO CPEP
90.0000 mg | ORAL_CAPSULE | Freq: Every day | ORAL | 0 refills | Status: DC
  Filled 2023-03-08: qty 30, 10d supply, fill #0

## 2023-03-08 MED ORDER — TIZANIDINE HCL 4 MG PO TABS
4.0000 mg | ORAL_TABLET | Freq: Four times a day (QID) | ORAL | 0 refills | Status: AC | PRN
  Filled 2023-03-08: qty 10, 10d supply, fill #0

## 2023-03-08 NOTE — Progress Notes (Signed)
Patient denies SI,HI, and A/V/H with no plan or intent. Patient demonstrates adequate appetite, med compliant, and cooperative on unit. Patient states feeling ready for discharge tomorrow to [facility]      03/08/23 0846  Psych Admission Type (Psych Patients Only)  Admission Status Voluntary  Psychosocial Assessment  Patient Complaints None  Eye Contact Fair  Facial Expression Animated  Affect Appropriate to circumstance  Speech Logical/coherent  Interaction Assertive  Motor Activity Slow  Appearance/Hygiene Unremarkable  Behavior Characteristics Cooperative;Appropriate to situation  Mood Pleasant  Thought Process  Coherency WDL  Content WDL  Delusions None reported or observed  Perception WDL  Hallucination None reported or observed  Judgment Impaired  Confusion None  Danger to Self  Current suicidal ideation? Denies  Danger to Others  Danger to Others None reported or observed  Danger to Others Abnormal  Harmful Behavior to others No threats or harm toward other people  Destructive Behavior No threats or harm toward property

## 2023-03-08 NOTE — Group Note (Signed)
Date:  03/08/2023 Time:  4:30 PM  Group Topic/Focus:  Activity Group:  The focus of the group is to promote activity outside in the courtyard to get some fresh air and some exercise.    Participation Level:  Did Not Attend   Kathryn [name] Kathryn [name] 03/08/2023, 4:30 PM

## 2023-03-08 NOTE — Progress Notes (Addendum)
[facility] MD Progress Note  03/08/2023 6:22 AM Kathryn [name]  MRN:  [id number]  Subjective:  Pt chart reviewed, discussed with interdisciplinary team, and seen on rounds. Client is more awake today and inclined to answer questions. "I am ok." Reports sleep is "pitiful" and requests Seroquel at bedtime which was ordered yesterday, will increase it. Appetite is good, client has crackers and juice at bedside and is requesting more snacks. Reports depression is moderate.. Anxiety is moderate and denies panic attacks. Denies medication side effects, hallucinations, paranoia and homicidal ideations. Client is attending groups and is on board with the plan to d/c to [facility] tomorrow.   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, insomnia, generalized anxiety disorder, major depressive disorder polysubstance abuse  Past Medical History:  Past Medical History:  Diagnosis Date   Anxiety    Arthritis    Asthma    Chronic back pain    COPD (chronic obstructive pulmonary disease) (HCC)    Chronic bronchitis   Depression    Dyspnea    GERD (gastroesophageal reflux disease)    Hypertension    Migraine    Neuropathy    Osteoarthritis of left knee, 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   Single subsegmental pulmonary embolism without acute cor pulmonale (HCC) 06/20/2021   Sleep apnea 04/2020   GETTING A cpap   Suicidal ideation 12/24/2018    Past Surgical History:  Procedure Laterality Date   BACK SURGERY     DILATATION AND CURETTAGE/HYSTEROSCOPY WITH MINERVA N/A 06/09/2020   Procedure: DILATATION AND CURETTAGE/HYSTEROSCOPY WITH MINERVA;  Surgeon: Lazaro Arms, MD;   Location: [facility];  Service: Gynecology;  Laterality: N/A;   ESOPHAGOGASTRODUODENOSCOPY (EGD) WITH PROPOFOL N/A 12/25/2018   Procedure: ESOPHAGOGASTRODUODENOSCOPY (EGD) WITH PROPOFOL;  Surgeon: Malissa Hippo, MD;  Location: [facility];  Service: Endoscopy;  Laterality: N/A;   FLEXIBLE SIGMOIDOSCOPY  10/14/2021   Procedure: FLEXIBLE SIGMOIDOSCOPY;  Surgeon: Dolores Frame, MD;  Location: [facility];  Service: Gastroenterology;;   PATELLA-FEMORAL ARTHROPLASTY Left 10/08/2018   Procedure: PATELLA-FEMORAL ARTHROPLASTY;  Surgeon: Teryl Lucy, MD;  Location: [facility];  Service: Orthopedics;  Laterality: Left;   TUBAL LIGATION     Family History:  Family History  Problem Relation Age of Onset   Gout Paternal Grandfather    Cirrhosis Paternal Grandfather    Hypertension Paternal Grandmother    Aneurysm Paternal Grandmother    Cirrhosis Maternal Grandmother    Cirrhosis Maternal Grandfather    Cancer Father    Cirrhosis Father    Cirrhosis Mother    Breast cancer Sister    Hypertension Sister    Bronchitis Daughter    Bronchitis Daughter    Asthma Son    Bronchitis Son    Migraines Neg Hx    Family Psychiatric  History: None reported Social History:  Social History   Substance and Sexual Activity  Alcohol Use Not Currently   Comment: states quit  06/29/22     Social History   Substance and Sexual Activity  Drug Use Yes   Types: Cocaine   Comment: last used 05/2022    Social History   Socioeconomic History   Marital status: Single    Spouse name: Not on file  Number of children: 3   Years of education: 10   Highest education level: 10th grade  Occupational History   Not on file  Tobacco Use   Smoking status: Former    Current packs/day: 0.00    Average packs/day: 0.3 packs/day for 10.0 years (2.5 ttl pk-yrs)    Types: Cigarettes    Start date: 04/2010    Quit date: 04/2020    Years since quitting: 2.8    Passive exposure: Never   Smokeless  tobacco: Never   Tobacco comments:    Smokes often, especially when using crack cocaine  Vaping Use   Vaping status: Never Used  Substance and Sexual Activity   Alcohol use: Not Currently    Comment: states quit  06/29/22   Drug use: Yes    Types: Cocaine    Comment: last used 05/2022   Sexual activity: Yes    Birth control/protection: Surgical    Comment: tubal, ablation  Other Topics Concern   Not on file  Social History Narrative   R handed    Lives with boyfriend   1 Cup of caffeine daily    Social Determinants of Health   Financial Resource Strain: Not on file  Food Insecurity: Food Insecurity Present (03/03/2023)   Hunger Vital Sign    Worried About Programme researcher, broadcasting/film/video in the Last Year: Often true    Ran Out of Food in the Last Year: Often true  Transportation Needs: Unmet Transportation Needs (03/03/2023)   PRAPARE - Administrator, Civil Service (Medical): Yes    Lack of Transportation (Non-Medical): Yes  Physical Activity: Not on file  Stress: Not on file  Social Connections: Unknown (08/30/2021)   Received from [employer], [facility]   Social Network    Social Network: Not on file   Additional Social History: homeless   Sleep: Poor  Appetite:  Good  Current Medications: Current Facility-Administered Medications  Medication Dose Route Frequency Provider Last Rate Last Admin   alum & mag hydroxide-simeth (MAALOX/MYLANTA) 200-200-20 MG/5ML suspension 30 mL  30 mL Oral Q4H PRN Bobbitt, Shalon E, NP   30 mL at 03/05/23 1212   amLODipine (NORVASC) tablet 5 mg  5 mg Oral Daily Jearld Lesch, NP   5 mg at 03/07/23 0850   aspirin chewable tablet 81 mg  81 mg Oral Daily Dixon, Rashaun M, NP   81 mg at 03/07/23 0850   haloperidol lactate (HALDOL) injection 5 mg  5 mg Intramuscular TID PRN Bobbitt, Shalon E, NP       And   diphenhydrAMINE (BENADRYL) injection 50 mg  50 mg Intramuscular TID PRN Bobbitt, Shalon E, NP       And   LORazepam (ATIVAN)  injection 2 mg  2 mg Intramuscular TID PRN Bobbitt, Shalon E, NP       diphenhydrAMINE (BENADRYL) injection 50 mg  50 mg Intramuscular Once PRN Charm Rings, NP       DULoxetine (CYMBALTA) DR capsule 90 mg  90 mg Oral Daily Myriam Forehand, NP   90 mg at 03/07/23 0850   lidocaine (LIDODERM) 5 % 1 patch  1 patch Transdermal Q24H Jearld Lesch, NP   1 patch at 03/07/23 1700   magnesium hydroxide (MILK OF MAGNESIA) suspension 30 mL  30 mL Oral Daily PRN Bobbitt, Shalon E, NP       nicotine polacrilex (NICORETTE) gum 2 mg  2 mg Oral PRN Myriam Forehand, NP  OLANZapine zydis (ZYPREXA) disintegrating tablet 5 mg  5 mg Oral TID PRN Jearld Lesch, NP   5 mg at 03/06/23 2315   pantoprazole (PROTONIX) EC tablet 40 mg  40 mg Oral Daily Lauree Chandler, NP   40 mg at 03/07/23 0850   pregabalin (LYRICA) capsule 150 mg  150 mg Oral BID Lerry Liner M, NP   150 mg at 03/07/23 1658   tiZANidine (ZANAFLEX) tablet 4 mg  4 mg Oral Q6H PRN Jearld Lesch, NP   4 mg at 03/07/23 0209    Lab Results: No results found for this or any previous visit (from the past 48 hour(s)).  Blood Alcohol level:  Lab Results  Component Value Date   ETH <10 03/01/2023   ETH <10 06/29/2022    Metabolic Disorder Labs: Lab Results  Component Value Date   HGBA1C 5.5 01/10/2022   MPG 111.15 01/10/2022   MPG 111.15 10/19/2021   No results found for: "PROLACTIN" Lab Results  Component Value Date   CHOL 168 01/10/2022   TRIG 56 01/10/2022   HDL 57 01/10/2022   CHOLHDL 2.9 01/10/2022   VLDL 11 01/10/2022   LDLCALC 100 (H) 01/10/2022   LDLCALC 84 10/19/2021    Physical Findings: AIMS:  , ,  ,  ,    CIWA:    COWS:  COWS Total Score: 0  Musculoskeletal: Strength & Muscle Tone:  ambulates with walker on unit Gait & Station:  ambulates with walker on unit Patient leans:  ambulates with walker on unit  .Psychiatric Specialty Exam: Physical Exam Vitals and nursing note reviewed.  Constitutional:       General: She is not in acute distress.    Appearance: Normal appearance. She is not ill-appearing, toxic-appearing or diaphoretic.  HENT:     Head: Normocephalic.     Nose: Nose normal.  Eyes:     General: No scleral icterus. Pulmonary:     Effort: Pulmonary effort is normal. No respiratory distress.  Musculoskeletal:        General: Normal range of motion.     Cervical back: Normal range of motion.  Neurological:     General: No focal deficit present.     Mental Status: She is alert and oriented to person, place, and time.  Psychiatric:        Attention and Perception: Attention and perception normal.        Mood and Affect: Mood is anxious. Affect is blunt.        Speech: Speech normal.        Behavior: Behavior normal. Behavior is cooperative.        Thought Content: Thought content normal.        Cognition and Memory: Cognition and memory normal.        Judgment: Judgment normal.     Review of Systems  Constitutional:  Negative for chills and fever.  Respiratory:  Negative for shortness of breath.   Cardiovascular:  Negative for chest pain and palpitations.  Gastrointestinal:  Negative for abdominal pain.  Psychiatric/Behavioral:  Positive for depression, sleep disturbance and substance abuse. The patient is nervous/anxious and has insomnia.   All other systems reviewed and are negative.   Blood pressure 131/77, pulse 87, temperature 97.9 F (36.6 C), resp. rate 17, height 4\' 11"  (1.499 m), weight 73.9 kg, last menstrual period 06/07/2020, SpO2 100%.Body mass index is 32.92 kg/m.  General Appearance: Fairly Groomed  Eye Contact:  Fair  Speech:  Normal  Rate  Volume:  Normal  Mood:  Depressed  Affect:  Congruent  Thought Process:  Coherent  Orientation:  Full (Time, Place, and Person)  Thought Content:  WDL  Suicidal Thoughts:  No  Homicidal Thoughts:  No  Memory:  Immediate;   Fair Recent;   Fair Remote;   Fair  Judgement:  Fair  Insight:  Fair  Psychomotor  Activity:  Normal  Concentration:  Concentration: Fair and Attention Span: Fair  Recall:  [employer] of Knowledge:  Fair  Language:  Good  Akathisia:  NA  Handed:  Right  AIMS (if indicated):     Assets:  Desire for Improvement Resilience  ADL's:  Intact  Cognition:  WNL  Sleep:  Poor    Physical Exam: Physical Exam Vitals and nursing note reviewed.  Constitutional:      General: She is not in acute distress.    Appearance: Normal appearance. She is not ill-appearing, toxic-appearing or diaphoretic.  HENT:     Head: Normocephalic.     Nose: Nose normal.  Eyes:     General: No scleral icterus. Pulmonary:     Effort: Pulmonary effort is normal. No respiratory distress.  Musculoskeletal:        General: Normal range of motion.     Cervical back: Normal range of motion.  Neurological:     General: No focal deficit present.     Mental Status: She is alert and oriented to person, place, and time.  Psychiatric:        Attention and Perception: Attention and perception normal.        Mood and Affect: Mood is anxious. Affect is blunt.        Speech: Speech normal.        Behavior: Behavior normal. Behavior is cooperative.        Thought Content: Thought content normal.        Cognition and Memory: Cognition and memory normal.        Judgment: Judgment normal.    Review of Systems  Constitutional:  Negative for chills and fever.  Respiratory:  Negative for shortness of breath.   Cardiovascular:  Negative for chest pain and palpitations.  Gastrointestinal:  Negative for abdominal pain.  Psychiatric/Behavioral:  Positive for depression, sleep disturbance and substance abuse. The patient is nervous/anxious and has insomnia.   All other systems reviewed and are negative.  Blood pressure 131/77, pulse 87, temperature 97.9 F (36.6 C), resp. rate 17, height 4\' 11"  (1.499 m), weight 73.9 kg, last menstrual period 06/07/2020, SpO2 100%. Body mass index is 32.92  kg/m.   Treatment Plan Summary: Daily contact with patient to assess and evaluate symptoms and progress in treatment, Medication management, and Plan   Major depressive disorder, recurrent, severe without psychosis: -Cymbalta 90mg  oral daily for depression, anxiety, and pain  General anxiety disorder: -Gabapentin 300mg  oral daily for anxiety, pain  Agitation: -Continue haldol 5mg  IM 3 times daily PRN agitation, moderate agitation AND benadryl 50mg  IM 3 times daily PRN moderate agitation AND ativan 2mg  IM 3 times daily PRN moderate agitation -Continue zyprexa 5mg  oral 3 times daily PRN mild agitation  Insomnia: Seroquel 50 mg at bedtime for sleep per her request, no reaction from it yesterday   Nanine Means, NP 03/08/2023, 6:22 AM

## 2023-03-08 NOTE — Group Note (Signed)
Date:  03/08/2023 Time:  9:42 PM  Group Topic/Focus:  Orientation:   The focus of this group is to educate the patient on the purpose and policies of crisis stabilization and provide a format to answer questions about their admission.  The group details unit policies and expectations of patients while admitted.    Participation Level:  Did Not Attend  Participation Quality:   none  Affect:   none  Cognitive:   none  Insight: None  Engagement in Group:   none  Modes of Intervention:   none  Additional Comments:  none   Ericberto Padget 03/08/2023, 9:42 PM

## 2023-03-08 NOTE — Plan of Care (Signed)
  Problem: Education: Goal: Verbalization of understanding the information provided will improve Outcome: Progressing   Problem: Activity: Goal: Sleeping patterns will improve Outcome: Progressing   Problem: Health Behavior/Discharge Planning: Goal: Compliance with treatment plan for underlying cause of condition will improve Outcome: Progressing   Problem: Safety: Goal: Periods of time without injury will increase Outcome: Progressing

## 2023-03-08 NOTE — Group Note (Signed)
Recreation Therapy Group Note   Group Topic:Relaxation  Group Date: 03/08/2023 Start Time: 1000 End Time: 1050 Facilitators: Rosina Lowenstein, LRT, CTRS Location:  Craft Room  Group Description: PMR (Progressive Muscle Relaxation). LRT asks patients their current level of stress/anxiety from 1-10, with 10 being the highest. LRT educates patients on what PMR is and the benefits that come from it. Patients are asked to sit with their feet flat on the floor while sitting up and all the way back in their chair, if possible. LRT and pts follow a prompt through a speaker that requires you to tense and release different muscles in their body and focus on their breathing. During session, lights are off and soft music is being played. Pts are given a stress ball to use if needed. At the end of the prompt, LRT asks patients to rank their current levels of stress/anxiety from 1-10, 10 being the highest. LRT provides patients with an education handout on PMR.   Goal Area(s) Addressed:  Patients will be able to describe progressive muscle relaxation.  Patient will practice using relaxation technique. Patient will identify a new coping skill.  Patient will follow multistep directions to reduce anxiety and stress.   Affect/Mood: Appropriate   Participation Level: Active and Engaged   Participation Quality: Independent   Behavior: Appropriate, Calm, and Cooperative   Speech/Thought Process: Coherent   Insight: Good   Judgement: Good   Modes of Intervention: Education and Exploration   Patient Response to Interventions:  Attentive and Requested additional information/resources    Education Outcome:  Acknowledges education   Clinical Observations/Individualized Feedback: Kathryn [name] was active in their participation of session activities and group discussion. Pt identified "I am stressed all the time but now I am ready to go lay down, I like that". Pt asked for a received a stress ball after  session. Pt interacted well with LRT and peers duration of session.    Plan: Continue to engage patient in RT group sessions 2-3x/week.   Rosina Lowenstein, LRT, CTRS 03/08/2023 11:36 AM

## 2023-03-09 DIAGNOSIS — F332 Major depressive disorder, recurrent severe without psychotic features: Secondary | ICD-10-CM | POA: Diagnosis not present

## 2023-03-09 MED ORDER — QUETIAPINE FUMARATE 100 MG PO TABS
100.0000 mg | ORAL_TABLET | Freq: Every day | ORAL | Status: DC
  Administered 2023-03-09 – 2023-03-11 (×3): 100 mg via ORAL
  Filled 2023-03-09 (×3): qty 1

## 2023-03-09 NOTE — Group Note (Signed)
Date:  03/09/2023 Time:  6:47 PM  Group Topic/Focus:  Activity Group:  The focus of the group is to promote activity for the patients to receive some fresh air and get some exercise out in the courtyard.    Participation Level:  Did Not Attend  Kathryn [name] 03/09/2023, 6:47 PM

## 2023-03-09 NOTE — Progress Notes (Signed)
   03/09/23 1100  Psych Admission Type (Psych Patients Only)  Admission Status Voluntary  Psychosocial Assessment  Patient Complaints Anxiety  Eye Contact Fair  Facial Expression Animated  Affect Anxious;Preoccupied  Speech Logical/coherent;Soft  Interaction Assertive  Motor Activity Slow  Appearance/Hygiene Unremarkable  Behavior Characteristics Appropriate to situation;Cooperative  Mood Anxious  Thought Process  Coherency WDL  Content WDL  Delusions None reported or observed  Perception WDL  Hallucination None reported or observed  Judgment WDL  Confusion WDL  Danger to Self  Current suicidal ideation? Denies  Agreement Not to Harm Self Yes  Description of Agreement verbal  Danger to Others  Danger to Others None reported or observed  Danger to Others Abnormal  Harmful Behavior to others No threats or harm toward other people  Destructive Behavior No threats or harm toward property

## 2023-03-09 NOTE — Progress Notes (Signed)
[facility] MD Progress Note  03/09/2023 3:47 PM Kathryn [name]  MRN:  [id number]  Subjective:  Pt chart reviewed, discussed with interdisciplinary team, and seen on rounds. Client continues to report a moderate level of depression and anxiety with no suicidal ideations or panic attacks.  Some dizziness today voiced, encouraged her to drink fluids and rise slowly when standing.  Vital signs are WDL along with her pulse.  Denies side effects from her medications. Sleep was good but awakened early, "need another dose of that medicine".  Appetite is "wonderful".  She was scheduled to go to [facility] today, cancelled due to transportation delay.  She will transfer to [facility] on Monday instead.  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, insomnia, generalized anxiety disorder, major depressive disorder polysubstance abuse  Past Medical History:  Past Medical History:  Diagnosis Date   Anxiety    Arthritis    Asthma    Chronic back pain    COPD (chronic obstructive pulmonary disease) (HCC)    Chronic bronchitis   Depression    Dyspnea    GERD (gastroesophageal reflux disease)    Hypertension    Migraine    Neuropathy    Osteoarthritis of left knee, 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   Single subsegmental pulmonary embolism without acute cor pulmonale (HCC) 06/20/2021   Sleep apnea 04/2020   GETTING A cpap   Suicidal ideation 12/24/2018    Past Surgical History:  Procedure Laterality Date   BACK SURGERY     DILATATION AND CURETTAGE/HYSTEROSCOPY WITH MINERVA N/A 06/09/2020   Procedure: DILATATION AND CURETTAGE/HYSTEROSCOPY WITH MINERVA;  Surgeon: Lazaro Arms,  MD;  Location: [facility];  Service: Gynecology;  Laterality: N/A;   ESOPHAGOGASTRODUODENOSCOPY (EGD) WITH PROPOFOL N/A 12/25/2018   Procedure: ESOPHAGOGASTRODUODENOSCOPY (EGD) WITH PROPOFOL;  Surgeon: Malissa Hippo, MD;  Location: [facility];  Service: Endoscopy;  Laterality: N/A;   FLEXIBLE SIGMOIDOSCOPY  10/14/2021   Procedure: FLEXIBLE SIGMOIDOSCOPY;  Surgeon: Dolores Frame, MD;  Location: [facility];  Service: Gastroenterology;;   PATELLA-FEMORAL ARTHROPLASTY Left 10/08/2018   Procedure: PATELLA-FEMORAL ARTHROPLASTY;  Surgeon: Teryl Lucy, MD;  Location: [facility];  Service: Orthopedics;  Laterality: Left;   TUBAL LIGATION     Family History:  Family History  Problem Relation Age of Onset   Gout Paternal Grandfather    Cirrhosis Paternal Grandfather    Hypertension Paternal Grandmother    Aneurysm Paternal Grandmother    Cirrhosis Maternal Grandmother    Cirrhosis Maternal Grandfather    Cancer Father    Cirrhosis Father    Cirrhosis Mother    Breast cancer Sister    Hypertension Sister    Bronchitis Daughter    Bronchitis Daughter    Asthma Son    Bronchitis Son    Migraines Neg Hx    Family Psychiatric  History: None reported Social History:  Social History   Substance and Sexual Activity  Alcohol Use Not Currently   Comment: states quit  06/29/22     Social History   Substance and Sexual Activity  Drug Use Yes   Types: Cocaine   Comment: last used 05/2022    Social History   Socioeconomic History   Marital status: Single  Spouse name: Not on file   Number of children: 3   Years of education: 10   Highest education level: 10th grade  Occupational History   Not on file  Tobacco Use   Smoking status: Former    Current packs/day: 0.00    Average packs/day: 0.3 packs/day for 10.0 years (2.5 ttl pk-yrs)    Types: Cigarettes    Start date: 04/2010    Quit date: 04/2020    Years since quitting: 2.8    Passive exposure: Never   Smokeless  tobacco: Never   Tobacco comments:    Smokes often, especially when using crack cocaine  Vaping Use   Vaping status: Never Used  Substance and Sexual Activity   Alcohol use: Not Currently    Comment: states quit  06/29/22   Drug use: Yes    Types: Cocaine    Comment: last used 05/2022   Sexual activity: Yes    Birth control/protection: Surgical    Comment: tubal, ablation  Other Topics Concern   Not on file  Social History Narrative   R handed    Lives with boyfriend   1 Cup of caffeine daily    Social Determinants of Health   Financial Resource Strain: Not on file  Food Insecurity: Food Insecurity Present (03/03/2023)   Hunger Vital Sign    Worried About Programme researcher, broadcasting/film/video in the Last Year: Often true    Ran Out of Food in the Last Year: Often true  Transportation Needs: Unmet Transportation Needs (03/03/2023)   PRAPARE - Administrator, Civil Service (Medical): Yes    Lack of Transportation (Non-Medical): Yes  Physical Activity: Not on file  Stress: Not on file  Social Connections: Unknown (08/30/2021)   Received from [employer]   Social Network    Social Network: Not on file   Additional Social History: homeless   Sleep: Poor  Appetite:  Good  Current Medications: Current Facility-Administered Medications  Medication Dose Route Frequency Provider Last Rate Last Admin   alum & mag hydroxide-simeth (MAALOX/MYLANTA) 200-200-20 MG/5ML suspension 30 mL  30 mL Oral Q4H PRN Bobbitt, Shalon E, NP   30 mL at 03/05/23 1212   amLODipine (NORVASC) tablet 5 mg  5 mg Oral Daily Jearld Lesch, NP   5 mg at 03/09/23 0857   aspirin chewable tablet 81 mg  81 mg Oral Daily Dixon, Rashaun M, NP   81 mg at 03/09/23 0857   haloperidol lactate (HALDOL) injection 5 mg  5 mg Intramuscular TID PRN Bobbitt, Shalon E, NP       And   diphenhydrAMINE (BENADRYL) injection 50 mg  50 mg Intramuscular TID PRN Bobbitt, Shalon E, NP       And   LORazepam (ATIVAN)  injection 2 mg  2 mg Intramuscular TID PRN Bobbitt, Shalon E, NP       diphenhydrAMINE (BENADRYL) injection 50 mg  50 mg Intramuscular Once PRN Charm Rings, NP       DULoxetine (CYMBALTA) DR capsule 90 mg  90 mg Oral Daily Myriam Forehand, NP   90 mg at 03/09/23 0856   lidocaine (LIDODERM) 5 % 1 patch  1 patch Transdermal Q24H Jearld Lesch, NP   1 patch at 03/08/23 1736   magnesium hydroxide (MILK OF MAGNESIA) suspension 30 mL  30 mL Oral Daily PRN Bobbitt, Shalon E, NP       nicotine polacrilex (NICORETTE) gum 2 mg  2 mg  Oral PRN Myriam Forehand, NP       OLANZapine zydis [facility]) disintegrating tablet 5 mg  5 mg Oral TID PRN Jearld Lesch, NP   5 mg at 03/06/23 2315   pantoprazole (PROTONIX) EC tablet 40 mg  40 mg Oral Daily Lauree Chandler, NP   40 mg at 03/09/23 0857   pregabalin (LYRICA) capsule 150 mg  150 mg Oral BID Lerry Liner M, NP   150 mg at 03/09/23 0857   QUEtiapine (SEROQUEL) tablet 50 mg  50 mg Oral QHS Charm Rings, NP   50 mg at 03/08/23 2053   tiZANidine (ZANAFLEX) tablet 4 mg  4 mg Oral Q6H PRN Jearld Lesch, NP   4 mg at 03/09/23 4098    Lab Results:  Results for orders placed or performed during the hospital encounter of 03/03/23 (from the past 48 hour(s))  TSH     Status: None   Collection Time: 03/08/23  6:59 AM  Result Value Ref Range   TSH 0.918 0.350 - 4.500 uIU/mL    Comment: Performed by a 3rd Generation assay with a functional sensitivity of <=0.01 uIU/mL. Performed at [facility], [address] [place], [place] [address]   Hemoglobin A1c     Status: Abnormal   Collection Time: 03/08/23  6:59 AM  Result Value Ref Range   Hgb A1c MFr Bld 5.8 (H) 4.8 - 5.6 %    Comment: (NOTE) Pre diabetes:          5.7%-6.4%  Diabetes:              >6.4%  Glycemic control for   <7.0% adults with diabetes    Mean Plasma Glucose 119.76 mg/dL    Comment: Performed at [facility], [address] [place], [place] [address]  Lipid  panel     Status: Abnormal   Collection Time: 03/08/23  6:59 AM  Result Value Ref Range   Cholesterol 152 0 - 200 mg/dL   Triglycerides 621 (H) <150 mg/dL   HDL 47 >30 mg/dL   Total CHOL/HDL Ratio 3.2 RATIO   VLDL 32 0 - 40 mg/dL   LDL Cholesterol 73 0 - 99 mg/dL    Comment:        Total Cholesterol/HDL:CHD Risk Coronary Heart Disease Risk Table                     Men   Women  1/2 Average Risk   3.4   3.3  Average Risk       5.0   4.4  2 X Average Risk   9.6   7.1  3 X Average Risk  23.4   11.0        Use the calculated Patient Ratio above and the CHD Risk Table to determine the patient's CHD Risk.        ATP III CLASSIFICATION (LDL):  <100     mg/dL   Optimal  865-784  mg/dL   Near or Above                    Optimal  130-159  mg/dL   Borderline  696-295  mg/dL   High  >284     mg/dL   Very High Performed at [facility], [address], [place], [place] [address]     Blood Alcohol level:  Lab Results  Component Value Date   [facility] <10 03/01/2023  ETH <10 06/29/2022    Metabolic Disorder Labs: Lab Results  Component Value Date   HGBA1C 5.8 (H) 03/08/2023   MPG 119.76 03/08/2023   MPG 111.15 01/10/2022   No results found for: "PROLACTIN" Lab Results  Component Value Date   CHOL 152 03/08/2023   TRIG 158 (H) 03/08/2023   HDL 47 03/08/2023   CHOLHDL 3.2 03/08/2023   VLDL 32 03/08/2023   LDLCALC 73 03/08/2023   LDLCALC 100 (H) 01/10/2022    Physical Findings: AIMS:  , ,  ,  ,    CIWA:    COWS:  COWS Total Score: 0  Musculoskeletal: Strength & Muscle Tone:  ambulates with walker on unit Gait & Station:  ambulates with walker on unit Patient leans:  ambulates with walker on unit  .Psychiatric Specialty Exam: Physical Exam Vitals and nursing note reviewed.  Constitutional:      General: She is not in acute distress.    Appearance: Normal appearance. She is not ill-appearing, toxic-appearing or diaphoretic.  HENT:     Head: Normocephalic.      Nose: Nose normal.  Eyes:     General: No scleral icterus. Pulmonary:     Effort: Pulmonary effort is normal. No respiratory distress.  Musculoskeletal:        General: Normal range of motion.     Cervical back: Normal range of motion.  Neurological:     General: No focal deficit present.     Mental Status: She is alert and oriented to person, place, and time.  Psychiatric:        Attention and Perception: Attention and perception normal.        Mood and Affect: Mood is anxious. Affect is blunt.        Speech: Speech normal.        Behavior: Behavior normal. Behavior is cooperative.        Thought Content: Thought content normal.        Cognition and Memory: Cognition and memory normal.        Judgment: Judgment normal.     Review of Systems  Constitutional:  Negative for chills and fever.  Respiratory:  Negative for shortness of breath.   Cardiovascular:  Negative for chest pain and palpitations.  Gastrointestinal:  Negative for abdominal pain.  Psychiatric/Behavioral:  Positive for depression, sleep disturbance and substance abuse. The patient is nervous/anxious and has insomnia.   All other systems reviewed and are negative.   Blood pressure 124/83, pulse 84, temperature 98.8 F (37.1 C), resp. rate 17, height 4\' 11"  (1.499 m), weight 73.9 kg, last menstrual period 06/07/2020, SpO2 100%.Body mass index is 32.92 kg/m.  General Appearance: Fairly Groomed  Eye Contact:  Fair  Speech:  Normal Rate  Volume:  Normal  Mood:  Depressed  Affect:  Congruent  Thought Process:  Coherent  Orientation:  Full (Time, Place, and Person)  Thought Content:  WDL  Suicidal Thoughts:  No  Homicidal Thoughts:  No  Memory:  Immediate;   Fair Recent;   Fair Remote;   Fair  Judgement:  Fair  Insight:  Fair  Psychomotor Activity:  Normal  Concentration:  Concentration: Fair and Attention Span: Fair  Recall:  [employer] of Knowledge:  Fair  Language:  Good  Akathisia:  NA  Handed:   Right  AIMS (if indicated):     Assets:  Desire for Improvement Resilience  ADL's:  Intact  Cognition:  WNL  Sleep:  Poor  Physical Exam: Physical Exam Vitals and nursing note reviewed.  Constitutional:      General: She is not in acute distress.    Appearance: Normal appearance. She is not ill-appearing, toxic-appearing or diaphoretic.  HENT:     Head: Normocephalic.     Nose: Nose normal.  Eyes:     General: No scleral icterus. Pulmonary:     Effort: Pulmonary effort is normal. No respiratory distress.  Musculoskeletal:        General: Normal range of motion.     Cervical back: Normal range of motion.  Neurological:     General: No focal deficit present.     Mental Status: She is alert and oriented to person, place, and time.  Psychiatric:        Attention and Perception: Attention and perception normal.        Mood and Affect: Mood is anxious. Affect is blunt.        Speech: Speech normal.        Behavior: Behavior normal. Behavior is cooperative.        Thought Content: Thought content normal.        Cognition and Memory: Cognition and memory normal.        Judgment: Judgment normal.    Review of Systems  Constitutional:  Negative for chills and fever.  Respiratory:  Negative for shortness of breath.   Cardiovascular:  Negative for chest pain and palpitations.  Gastrointestinal:  Negative for abdominal pain.  Psychiatric/Behavioral:  Positive for depression, sleep disturbance and substance abuse. The patient is nervous/anxious and has insomnia.   All other systems reviewed and are negative.  Blood pressure 124/83, pulse 84, temperature 98.8 F (37.1 C), resp. rate 17, height 4\' 11"  (1.499 m), weight 73.9 kg, last menstrual period 06/07/2020, SpO2 100%. Body mass index is 32.92 kg/m.   Treatment Plan Summary: Daily contact with patient to assess and evaluate symptoms and progress in treatment, Medication management, and Plan   Major depressive disorder,  recurrent, severe without psychosis: -Cymbalta 90mg  oral daily for depression, anxiety, and pain  General anxiety disorder: -Gabapentin 300mg  oral daily for anxiety, pain  Agitation: -Continue haldol 5mg  IM 3 times daily PRN agitation, moderate agitation AND benadryl 50mg  IM 3 times daily PRN moderate agitation AND ativan 2mg  IM 3 times daily PRN moderate agitation -Continue zyprexa 5mg  oral 3 times daily PRN mild agitation  Insomnia: Seroquel 50 mg at bedtime for sleep increase to 100 mg daily at bedtime   Nanine Means, NP 03/09/2023, 3:47 PM

## 2023-03-09 NOTE — Plan of Care (Signed)

## 2023-03-09 NOTE — Plan of Care (Signed)
Problem: Education: Goal: Knowledge of [place] [facility] Education information/materials will improve 03/09/2023 1216 by Delos Haring, RN Outcome: Progressing 03/09/2023 0938 by Delos Haring, RN Outcome: Adequate for Discharge Goal: Emotional status will improve 03/09/2023 1216 by Delos Haring, RN Outcome: Progressing 03/09/2023 0938 by Delos Haring, RN Outcome: Adequate for Discharge Goal: Mental status will improve 03/09/2023 1216 by Delos Haring, RN Outcome: Progressing 03/09/2023 0938 by Delos Haring, RN Outcome: Adequate for Discharge Goal: Verbalization of understanding the information provided will improve 03/09/2023 1216 by Delos Haring, RN Outcome: Progressing 03/09/2023 0938 by Delos Haring, RN Outcome: Adequate for Discharge   Problem: Activity: Goal: Interest or engagement in activities will improve 03/09/2023 1216 by Delos Haring, RN Outcome: Progressing 03/09/2023 0938 by Delos Haring, RN Outcome: Adequate for Discharge Goal: Sleeping patterns will improve 03/09/2023 1216 by Delos Haring, RN Outcome: Progressing 03/09/2023 0938 by Delos Haring, RN Outcome: Adequate for Discharge   Problem: Coping: Goal: Ability to verbalize frustrations and anger appropriately will improve 03/09/2023 1216 by Delos Haring, RN Outcome: Progressing 03/09/2023 0938 by Delos Haring, RN Outcome: Adequate for Discharge Goal: Ability to demonstrate self-control will improve 03/09/2023 1216 by Delos Haring, RN Outcome: Progressing 03/09/2023 0938 by Delos Haring, RN Outcome: Adequate for Discharge   Problem: Health Behavior/Discharge Planning: Goal: Identification of resources available to assist in meeting health care needs will improve 03/09/2023 1216 by Delos Haring, RN Outcome: Progressing 03/09/2023 0938 by Delos Haring, RN Outcome: Adequate for Discharge Goal: Compliance with treatment plan for underlying cause of  condition will improve 03/09/2023 1216 by Delos Haring, RN Outcome: Progressing 03/09/2023 0938 by Delos Haring, RN Outcome: Adequate for Discharge   Problem: Physical Regulation: Goal: Ability to maintain clinical measurements within normal limits will improve 03/09/2023 1216 by Delos Haring, RN Outcome: Progressing 03/09/2023 0938 by Delos Haring, RN Outcome: Adequate for Discharge   Problem: Safety: Goal: Periods of time without injury will increase 03/09/2023 1216 by Delos Haring, RN Outcome: Progressing 03/09/2023 0938 by Delos Haring, RN Outcome: Adequate for Discharge   Problem: Education: Goal: Utilization of techniques to improve thought processes will improve Outcome: Progressing Goal: Knowledge of the prescribed therapeutic regimen will improve Outcome: Progressing   Problem: Activity: Goal: Interest or engagement in leisure activities will improve Outcome: Progressing Goal: Imbalance in normal sleep/wake cycle will improve Outcome: Progressing   Problem: Coping: Goal: Coping ability will improve Outcome: Progressing Goal: Will verbalize feelings Outcome: Progressing   Problem: Health Behavior/Discharge Planning: Goal: Ability to make decisions will improve Outcome: Progressing Goal: Compliance with therapeutic regimen will improve Outcome: Progressing   Problem: Role Relationship: Goal: Will demonstrate positive changes in social behaviors and relationships Outcome: Progressing   Problem: Safety: Goal: Ability to disclose and discuss suicidal ideas will improve Outcome: Progressing Goal: Ability to identify and utilize support systems that promote safety will improve Outcome: Progressing   Problem: Self-Concept: Goal: Will verbalize positive feelings about self Outcome: Progressing Goal: Level of anxiety will decrease Outcome: Progressing   Problem: Education: Goal: Knowledge of disease or condition will improve Outcome:  Progressing Goal: Understanding of discharge needs will improve Outcome: Progressing   Problem: Health Behavior/Discharge Planning: Goal: Ability to identify changes in lifestyle to reduce recurrence of condition will improve Outcome: Progressing Goal: Identification of resources available to assist in meeting health care needs will improve Outcome: Progressing   Problem: Physical Regulation: Goal:  Complications related to the disease process, condition or treatment will be avoided or minimized Outcome: Progressing   Problem: Safety: Goal: Ability to remain free from injury will improve Outcome: Progressing

## 2023-03-09 NOTE — [facility] Suicide Risk Assessment (Signed)
[facility] Discharge Suicide Risk Assessment   Principal Problem: Major depressive disorder, recurrent severe without psychotic features [facility]) Discharge Diagnoses: Principal Problem:   Major depressive disorder, recurrent severe without psychotic features (HCC) Active Problems:   Polysubstance abuse (HCC)   Total Time spent with patient: 45 minutes  Musculoskeletal: Strength & Muscle Tone: within normal limits Gait & Station: normal Patient leans: N/A  Psychiatric Specialty Exam: Physical Exam Vitals and nursing note reviewed.  HENT:     Head: Normocephalic.     Nose: Nose normal.  Pulmonary:     Effort: Pulmonary effort is normal.  Musculoskeletal:        General: Normal range of motion.     Cervical back: Normal range of motion.  Neurological:     General: No focal deficit present.     Mental Status: She is alert and oriented to person, place, and time.     Review of Systems  Psychiatric/Behavioral:  Positive for substance abuse. The patient is nervous/anxious.   All other systems reviewed and are negative.   Blood pressure 124/83, pulse 84, temperature 98.8 F (37.1 C), resp. rate 17, height 4\' 11"  (1.499 m), weight 73.9 kg, last menstrual period 06/07/2020, SpO2 100%.Body mass index is 32.92 kg/m.  General Appearance: Casual  Eye Contact:  Good  Speech:  Normal Rate  Volume:  Normal  Mood:  Anxious  Affect:  Congruent  Thought Process:  Coherent  Orientation:  Full (Time, Place, and Person)  Thought Content:  WDL and Logical  Suicidal Thoughts:  No  Homicidal Thoughts:  No  Memory:  Immediate;   Good Recent;   Good Remote;   Good  Judgement:  Fair  Insight:  Fair  Psychomotor Activity:  Normal  Concentration:  Concentration: Good and Attention Span: Good  Recall:  Good  Fund of Knowledge:  Good  Language:  Good  Akathisia:  No  Handed:  Right  AIMS (if indicated):     Assets:  Leisure Time Physical Health Resilience  ADL's:  Intact  Cognition:  WNL   Sleep:        Physical Exam: Physical Exam Vitals and nursing note reviewed.  HENT:     Head: Normocephalic.     Nose: Nose normal.  Pulmonary:     Effort: Pulmonary effort is normal.  Musculoskeletal:        General: Normal range of motion.     Cervical back: Normal range of motion.  Neurological:     General: No focal deficit present.     Mental Status: She is alert and oriented to person, place, and time.    Review of Systems  Psychiatric/Behavioral:  Positive for substance abuse. The patient is nervous/anxious.   All other systems reviewed and are negative.  Blood pressure 124/83, pulse 84, temperature 98.8 F (37.1 C), resp. rate 17, height 4\' 11"  (1.499 m), weight 73.9 kg, last menstrual period 06/07/2020, SpO2 100%. Body mass index is 32.92 kg/m.  Mental Status Per Nursing Assessment::   On Admission:  Suicidal ideation indicated by patient  Demographic Factors:  Low socioeconomic status  Loss Factors: Financial problems/change in socioeconomic status  Historical Factors: NA  Risk Reduction Factors:   Sense of responsibility to family and Positive social support  Continued Clinical Symptoms:  Depression and anxiety, mild  Cognitive Features That Contribute To Risk:  None    Suicide Risk:  Minimal: No identifiable suicidal ideation.  Patients presenting with no risk factors but with morbid ruminations; may be  classified as minimal risk based on the severity of the depressive symptoms   Follow-up Information     [facility] Go to.   Specialty: Addiction Medicine Why: By Eden Lathe 03/09/23. Contact information: [address] [place] [address] [phone]                 Plan Of Care/Follow-up recommendations:  Activity:  as tolerated Diet:  heart healthy diet Major depressive disorder, recurrent, severe without psychosis: -Cymbalta 90mg  oral daily for depression, anxiety, and pain   General  anxiety disorder: -Gabapentin 300mg  oral daily for anxiety, pain   Insomnia: Seroquel 50 mg at bedtime for sleep per her request, no reaction from it yesterday  Nanine Means, NP 03/09/2023, 6:46 AM

## 2023-03-09 NOTE — Group Note (Signed)
Date:  03/09/2023 Time:  11:05 PM  Group Topic/Focus:  Wrap-Up Group:   The focus of this group is to help patients review their daily goal of treatment and discuss progress on daily workbooks.    Participation Level:  Active  Participation Quality:  Appropriate  Affect:  Appropriate  Cognitive:  Appropriate  Insight: Improving  Engagement in Group:  Engaged  Modes of Intervention:  Discussion    Lenore Cordia 03/09/2023, 11:05 PM

## 2023-03-09 NOTE — Plan of Care (Signed)
  Problem: Education: Goal: Knowledge of [place] [facility] Education information/materials will improve Outcome: Progressing Goal: Emotional status will improve Outcome: Progressing Goal: Mental status will improve Outcome: Progressing Goal: Verbalization of understanding the information provided will improve Outcome: Progressing  Patient ambulating with front wheel walker all fall protocol in place. Compliant with treatment plan. Patient verbalized readiness to go to rehab. Support provided as needed.

## 2023-03-09 NOTE — Discharge Summary (Signed)
Physician Discharge Summary Note  Patient:  Kathryn [name] is an 52 y.o., female MRN:  [id number] DOB:  12/25/1970 Patient phone:  [phone] (home)  Patient address:   [address] [place] [facility] [address],  Total Time spent with patient: 45 minutes  Date of Admission:  03/03/2023 Date of Discharge: 03/12/2023  Reason for Admission:  cocaine abuse with suicidal ideations  Principal Problem: Major depressive disorder, recurrent severe without psychotic features [facility]) Discharge Diagnoses: Principal Problem:   Major depressive disorder, recurrent severe without psychotic features (HCC) Active Problems:   Polysubstance abuse (HCC)   Past Psychiatric History: polysubstance use d/o, depression, anxiety  Past Medical History:  Past Medical History:  Diagnosis Date   Anxiety    Arthritis    Asthma    Chronic back pain    COPD (chronic obstructive pulmonary disease) (HCC)    Chronic bronchitis   Depression    Dyspnea    GERD (gastroesophageal reflux disease)    Hypertension    Migraine    Neuropathy    Osteoarthritis of left knee, 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   Single subsegmental pulmonary embolism without acute cor pulmonale (HCC) 06/20/2021   Sleep apnea 04/2020   GETTING [name] cpap   Suicidal ideation 12/24/2018    Past Surgical History:  Procedure Laterality Date   BACK SURGERY     DILATATION AND CURETTAGE/HYSTEROSCOPY WITH MINERVA N/[name] 06/09/2020   Procedure: DILATATION AND CURETTAGE/HYSTEROSCOPY WITH MINERVA;  Surgeon: Lazaro Arms, MD;  Location: [facility];  Service: Gynecology;  Laterality: N/[name];   ESOPHAGOGASTRODUODENOSCOPY (EGD) WITH PROPOFOL N/[name] 12/25/2018   Procedure: ESOPHAGOGASTRODUODENOSCOPY (EGD) WITH PROPOFOL;  Surgeon: Malissa Hippo, MD;  Location: [facility];  Service: Endoscopy;  Laterality: N/[name];   FLEXIBLE SIGMOIDOSCOPY  10/14/2021   Procedure: FLEXIBLE SIGMOIDOSCOPY;  Surgeon: Dolores Frame, MD;  Location: [facility];  Service:  Gastroenterology;;   PATELLA-FEMORAL ARTHROPLASTY Left 10/08/2018   Procedure: PATELLA-FEMORAL ARTHROPLASTY;  Surgeon: Teryl Lucy, MD;  Location: [facility];  Service: Orthopedics;  Laterality: Left;   TUBAL LIGATION     Family History:  Family History  Problem Relation Age of Onset   Gout Paternal Grandfather    Cirrhosis Paternal Grandfather    Hypertension Paternal Grandmother    Aneurysm Paternal Grandmother    Cirrhosis Maternal Grandmother    Cirrhosis Maternal Grandfather    Cancer Father    Cirrhosis Father    Cirrhosis Mother    Breast cancer Sister    Hypertension Sister    Bronchitis Daughter    Bronchitis Daughter    Asthma Son    Bronchitis Son    Migraines Neg Hx    Family Psychiatric  History: none Social History:  Social History   Substance and Sexual Activity  Alcohol Use Not Currently   Comment: states quit  06/29/22     Social History   Substance and Sexual Activity  Drug Use Yes   Types: Cocaine   Comment: last used 05/2022    Social History   Socioeconomic History   Marital status: Single    Spouse name: Not on file   Number of children: 3   Years of education: 10   Highest education level: 10th grade  Occupational History   Not on file  Tobacco Use   Smoking status: Former    Current packs/day: 0.00    Average packs/day: 0.3 packs/day for 10.0 years (2.5 ttl pk-yrs)    Types: Cigarettes    Start date: 04/2010    Quit  date: 04/2020    Years since quitting: 2.8    Passive exposure: Never   Smokeless tobacco: Never   Tobacco comments:    Smokes often, especially when using crack cocaine  Vaping Use   Vaping status: Never Used  Substance and Sexual Activity   Alcohol use: Not Currently    Comment: states quit  06/29/22   Drug use: Yes    Types: Cocaine    Comment: last used 05/2022   Sexual activity: Yes    Birth control/protection: Surgical    Comment: tubal, ablation  Other Topics Concern   Not on file  Social History  Narrative   R handed    Lives with boyfriend   1 Cup of caffeine daily    Social Determinants of Health   Financial Resource Strain: Not on file  Food Insecurity: Food Insecurity Present (03/03/2023)   Hunger Vital Sign    Worried About Programme researcher, broadcasting/film/video in the Last Year: Often true    Ran Out of Food in the Last Year: Often true  Transportation Needs: Unmet Transportation Needs (03/03/2023)   PRAPARE - Administrator, Civil Service (Medical): Yes    Lack of Transportation (Non-Medical): Yes  Physical Activity: Not on file  Stress: Not on file  Social Connections: Unknown (08/30/2021)   Received [facility]   Social Network    Social Network: Not on file    Hospital Course:   52 yo female admitted with [name] history of hypertension, lumbar stenosis, schizoaffective disorder, bipolar type, and polysubstance abuse, admitted from the emergency department for evaluation of worsening mental health and substance abuse.  Medications and therapy were initiated with adjustments made.  Her symptoms stabilized with no suicidal/homicidal ideations, hallucinations, and withdrawal symptoms.  [name] has met maximum capacity of hospitalization. Discharge instructions provided with explanations along with crisis numbers, Rx, and follow up appointment information.  Kathryn [name] will continue her treatment with [facility].  Musculoskeletal: Strength & Muscle Tone: within normal limits Gait & Station: normal Patient leans: N/[name]   Psychiatric Specialty Exam: Physical Exam  Review of Systems  Blood pressure 124/83, pulse 84, temperature 98.8 F (37.1 C), resp. rate 17, height 4\' 11"  (1.499 m), weight 73.9 kg, last menstrual period 06/07/2020, SpO2 100%.Body mass index is 32.92 kg/m.  General Appearance: Casual  Eye Contact:  Good  Speech:  Normal Rate  Volume:  Normal  Mood:  Anxious   Affect:  Congruent  Thought Process:  Coherent  Orientation:  Full (Time, Place, and  Person)  Thought Content:  WDL and Logical  Suicidal Thoughts:  No  Homicidal Thoughts:  No  Memory:  Immediate;   Good Recent;   Good Remote;   Good  Judgement:  Fair  Insight:  Fair  Psychomotor Activity:  Normal  Concentration:  Concentration: Good and Attention Span: Good  Recall:  Good  Fund of Knowledge:  Good  Language:  Good  Akathisia:  No  Handed:  Right  AIMS (if indicated):     Assets:  Leisure Time Physical Health Resilience  ADL's:  Intact  Cognition:  WNL  Sleep:       Physical Exam: Physical Exam Vitals and nursing note reviewed.  Constitutional:      Appearance: Normal appearance.  HENT:     Head: Normocephalic.     Nose: Nose normal.  Pulmonary:     Effort: Pulmonary effort is normal.  Musculoskeletal:        General:  Normal range of motion.     Cervical back: Normal range of motion.  Neurological:     General: No focal deficit present.     Mental Status: She is alert and oriented to person, place, and time.    Review of Systems  Psychiatric/Behavioral:  Positive for substance abuse. The patient is nervous/anxious.   All other systems reviewed and are negative.  Blood pressure 124/83, pulse 84, temperature 98.8 F (37.1 C), resp. rate 17, height 4\' 11"  (1.499 m), weight 73.9 kg, last menstrual period 06/07/2020, SpO2 100%. Body mass index is 32.92 kg/m.   Social History   Tobacco Use  Smoking Status Former   Current packs/day: 0.00   Average packs/day: 0.3 packs/day for 10.0 years (2.5 ttl pk-yrs)   Types: Cigarettes   Start date: 04/2010   Quit date: 04/2020   Years since quitting: 2.8   Passive exposure: Never  Smokeless Tobacco Never  Tobacco Comments   Smokes often, especially when using crack cocaine   Tobacco Cessation:  [name] prescription for an FDA-approved tobacco cessation medication was offered at discharge and the patient refused   Blood Alcohol level:  Lab Results  Component Value Date   [facility] <10 03/01/2023   ETH <10  06/29/2022    Metabolic Disorder Labs:  Lab Results  Component Value Date   HGBA1C 5.8 (H) 03/08/2023   MPG 119.76 03/08/2023   MPG 111.15 01/10/2022   No results found for: "PROLACTIN" Lab Results  Component Value Date   CHOL 152 03/08/2023   TRIG 158 (H) 03/08/2023   HDL 47 03/08/2023   CHOLHDL 3.2 03/08/2023   VLDL 32 03/08/2023   LDLCALC 73 03/08/2023   LDLCALC 100 (H) 01/10/2022    See Psychiatric Specialty Exam and Suicide Risk Assessment completed by Attending Physician prior to discharge.  Discharge destination:  [facility]  Is patient on multiple antipsychotic therapies at discharge:  No   Has Patient had three or more failed trials of antipsychotic monotherapy by history:  No  Recommended Plan for Multiple Antipsychotic Therapies: NA  Discharge Instructions     Diet - low sodium heart healthy   Complete by: As directed    Discharge instructions   Complete by: As directed    Follow up with [facility]   Increase activity slowly   Complete by: As directed       Allergies as of 03/09/2023       Reactions   Fish Allergy Anaphylaxis, Shortness Of Breath, Swelling   Flexeril [cyclobenzaprine Hcl] Shortness Of Breath   Ibuprofen Anaphylaxis, Hives, Other (See Comments), Cough, Rash   Shellfish Allergy Anaphylaxis   Tylenol [acetaminophen] Anaphylaxis   Tramadol Nausea And Vomiting   Upset stomach   Ace Inhibitors Cough   Peanut Allergen Powder-dnfp    Trazodone And Nefazodone Hives        Medication List     STOP taking these medications    albuterol (2.5 MG/3ML) 0.083% nebulizer solution Commonly known as: PROVENTIL   albuterol 108 (90 Base) MCG/ACT inhaler Commonly known as: VENTOLIN HFA   aspirin 81 MG chewable tablet   cholecalciferol 25 MCG (1000 UNIT) tablet Commonly known as: VITAMIN D3   hydrOXYzine 50 MG tablet Commonly known as: ATARAX   lidocaine 5 % Commonly known as: LIDODERM   Lurasidone HCl 60 MG Tabs   methocarbamol 500 MG  tablet Commonly known as: ROBAXIN   methylPREDNISolone 4 MG Tbpk tablet Commonly known as: MEDROL DOSEPAK   mirtazapine 15 MG tablet  Commonly known as: REMERON   oxyCODONE 5 MG immediate release tablet Commonly known as: Roxicodone   pantoprazole 40 MG tablet Commonly known as: PROTONIX   pregabalin 150 MG capsule Commonly known as: LYRICA   ramelteon 8 MG tablet Commonly known as: ROZEREM       TAKE these medications      Indication  amLODipine 5 MG tablet Commonly known as: NORVASC Take 1 tablet (5 mg total) by mouth daily for 10 days. What changed: Another medication with the same name was removed. Continue taking this medication, and follow the directions you see here.  Indication: High Blood Pressure   DULoxetine 30 MG capsule Commonly known as: CYMBALTA Take 3 capsules (90 mg total) by mouth daily for 10 days. What changed:  medication strength how much to take  Indication: Major Depressive Disorder   fluticasone furoate-vilanterol 200-25 MCG/ACT Aepb Commonly known as: Breo Ellipta Inhale 1 puff into the lungs daily.  Indication: Asthma   gabapentin 300 MG capsule Commonly known as: Neurontin Take 1 capsule (300 mg total) by mouth daily for 10 days.  Indication: Peripheral Nerve Disease   omeprazole 20 MG capsule Commonly known as: PRILOSEC Take 20 mg by mouth daily before breakfast.  Indication: Heartburn   QUEtiapine 50 MG tablet Commonly known as: SEROQUEL Take 1 tablet (50 mg total) by mouth at bedtime for 10 days.  Indication: Trouble Sleeping   tiZANidine 4 MG tablet Commonly known as: ZANAFLEX Take 1 tablet (4 mg total) by mouth every 6 (six) hours as needed for up to 10 days for muscle spasms. What changed: medication strength  Indication: Muscle Spasticity        Follow-up Information     [facility] Go to.   Specialty: Addiction Medicine Why: By Eden Lathe 03/09/23. Contact information: [address] [place] [address] [phone]                 Follow-up recommendations:  Activity:  as tolerated Diet:  heart healthy diet Major depressive disorder, recurrent, severe without psychosis: -Cymbalta 90mg  oral daily for depression, anxiety, and pain   General anxiety disorder: -Gabapentin 300mg  oral daily for anxiety, pain   Insomnia: Seroquel 50 mg at bedtime for sleep per her request, no reaction from it yesterday  Comments:  follow up with [facility]  Signed: Nanine Means, NP 03/09/2023, 6:49 AM

## 2023-03-09 NOTE — Group Note (Signed)
Recreation Therapy Group Note   Group Topic:Leisure Education  Group Date: 03/09/2023 Start Time: 1000 End Time: 1100 Facilitators: Rosina Lowenstein, LRT, CTRS Location:  Craft Room  Group Description: Leisure. Patients were given the option to choose from singing karaoke, coloring mandalas, using oil pastels, journaling, or playing with play-doh. LRT and pts discussed the meaning of leisure, the importance of participating in leisure during their free time/when they're outside of the hospital, as well as how our leisure interests can also serve as coping skills.   Goal Area(s) Addressed:  Patient will identify a current leisure interest.  Patient will learn the definition of "leisure". Patient will practice making a positive decision. Patient will have the opportunity to try a new leisure activity. Patient will communicate with peers and LRT.    Affect/Mood: N/A   Participation Level: Did not attend    Clinical Observations/Individualized Feedback: Kathryn [name] did not attend group.   Plan: Continue to engage patient in RT group sessions 2-3x/week.   Rosina Lowenstein, LRT, CTRS 03/09/2023 11:56 AM

## 2023-03-10 DIAGNOSIS — F332 Major depressive disorder, recurrent severe without psychotic features: Secondary | ICD-10-CM | POA: Diagnosis not present

## 2023-03-10 NOTE — Progress Notes (Signed)
D- Patient alert and oriented x 4. Affect anxious/mood congruent. Denies SI/ HI/ AVH. Patient complains of chronic back and leg pain 8/10. Takes scheduled Lyrica with fair results and rests often to conserve energy. Patient endorses depression and anxiety. Her goal is to "get to rehab next week and manage her chronic pain". She uses a 4 wheel walker to ambulate but is able to ambulate independently without assist. .A- Scheduled medications administered to patient, per MD orders. Support and encouragement provided.  Routine safety checks conducted every 15 minutes without incident.  Patient informed to notify staff with problems or concerns and verbalizes understanding. R- No adverse drug reactions noted.  Patient compliant with medications  Patient receptive, calm and cooperative. She is isolative to her room except for meals.  Patient contracts for safety and  remains safe on the unit at this time.

## 2023-03-10 NOTE — Group Note (Signed)
[facility] LCSW Group Therapy Note   Group Date: 03/10/2023 Start Time: 1315 End Time: 1350   Type of Therapy/Topic:  Group Therapy:  Balance in Life  Participation Level:  Did Not Attend   Description of Group:    This group will address the concept of balance and how it feels and looks when one is unbalanced. Patients will be encouraged to process areas in their lives that are out of balance, and identify reasons for remaining unbalanced. Facilitators will guide patients utilizing problem- solving interventions to address and correct the stressor making their life unbalanced. Understanding and applying boundaries will be explored and addressed for obtaining  and maintaining a balanced life. Patients will be encouraged to explore ways to assertively make their unbalanced needs known to significant others in their lives, using other group members and facilitator for support and feedback.  Therapeutic Goals: Patient will identify two or more emotions or situations they have that consume much of in their lives. Patient will identify signs/triggers that life has become out of balance:  Patient will identify two ways to set boundaries in order to achieve balance in their lives:  Patient will demonstrate ability to communicate their needs through discussion and/or role plays  Summary of Patient Progress:   The patient did not attend group.        Marshell Levan, LCSW

## 2023-03-10 NOTE — Plan of Care (Signed)
Problem: Education: Goal: Knowledge of [facility] Education information/materials will improve Outcome: Progressing Goal: Emotional status will improve Outcome: Progressing Goal: Mental status will improve Outcome: Progressing Goal: Verbalization of understanding the information provided will improve Outcome: Progressing   Problem: Activity: Goal: Interest or engagement in activities will improve Outcome: Progressing Goal: Sleeping patterns will improve Outcome: Progressing   Problem: Coping: Goal: Ability to verbalize frustrations and anger appropriately will improve Outcome: Progressing Goal: Ability to demonstrate self-control will improve Outcome: Progressing   Problem: Health Behavior/Discharge Planning: Goal: Identification of resources available to assist in meeting health care needs will improve Outcome: Progressing Goal: Compliance with treatment plan for underlying cause of condition will improve Outcome: Progressing   Problem: Physical Regulation: Goal: Ability to maintain clinical measurements within normal limits will improve Outcome: Progressing   Problem: Safety: Goal: Periods of time without injury will increase Outcome: Progressing   Problem: Education: Goal: Utilization of techniques to improve thought processes will improve Outcome: Progressing Goal: Knowledge of the prescribed therapeutic regimen will improve Outcome: Progressing   Problem: Activity: Goal: Interest or engagement in leisure activities will improve Outcome: Progressing Goal: Imbalance in normal sleep/wake cycle will improve Outcome: Progressing   Problem: Coping: Goal: Coping ability will improve Outcome: Progressing Goal: Will verbalize feelings Outcome: Progressing   Problem: Health Behavior/Discharge Planning: Goal: Ability to make decisions will improve Outcome: Progressing Goal: Compliance with therapeutic regimen will improve Outcome: Progressing    Problem: Role Relationship: Goal: Will demonstrate positive changes in social behaviors and relationships Outcome: Progressing   Problem: Safety: Goal: Ability to disclose and discuss suicidal ideas will improve Outcome: Progressing Goal: Ability to identify and utilize support systems that promote safety will improve Outcome: Progressing   Problem: Self-Concept: Goal: Will verbalize positive feelings about self Outcome: Progressing Goal: Level of anxiety will decrease Outcome: Progressing   Problem: Education: Goal: Knowledge of disease or condition will improve Outcome: Progressing Goal: Understanding of discharge needs will improve Outcome: Progressing   Problem: Health Behavior/Discharge Planning: Goal: Ability to identify changes in lifestyle to reduce recurrence of condition will improve Outcome: Progressing Goal: Identification of resources available to assist in meeting health care needs will improve Outcome: Progressing   Problem: Physical Regulation: Goal: Complications related to the disease process, condition or treatment will be avoided or minimized Outcome: Progressing   Problem: Safety: Goal: Ability to remain free from injury will improve Outcome: Progressing

## 2023-03-10 NOTE — Progress Notes (Signed)
[facility] MD Progress Note  03/10/2023 9:38 AM Kathryn [name]  MRN:  [id number]  Subjective:  Pt chart reviewed, discussed with interdisciplinary team, and seen on rounds. Kathryn [name] appears disheveled and is nodding off during the interview. She reports not sleeping well last night and desires an adjustment/increase of her Seroquel if possible, despite being sleepy on assessment this morning. Kathryn [name] reports moderate level of both depression and anxiety. She denies suicidal or homicidal ideation, delusions, or hallucinations. Kathryn [name] reports a good appetite. She complains of bilateral leg pain and swelling however no edema noted on exam. She understands the plan is to [facility].  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, insomnia, generalized anxiety disorder, major depressive disorder polysubstance abuse  Past Medical History:  Past Medical History:  Diagnosis Date   Anxiety    Arthritis    Asthma    Chronic back pain    COPD (chronic obstructive pulmonary disease) (HCC)    Chronic bronchitis   Depression    Dyspnea    GERD (gastroesophageal reflux disease)    Hypertension    Migraine    Neuropathy    Osteoarthritis of left knee, 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   Single subsegmental pulmonary embolism without acute cor pulmonale (HCC) 06/20/2021   Sleep apnea 04/2020   GETTING A cpap   Suicidal ideation 12/24/2018    Past Surgical History:  Procedure Laterality Date   BACK SURGERY     DILATATION AND CURETTAGE/HYSTEROSCOPY WITH MINERVA N/A 06/09/2020   Procedure: DILATATION AND CURETTAGE/HYSTEROSCOPY WITH MINERVA;  Surgeon:  Lazaro Arms, MD;  Location: [facility];  Service: Gynecology;  Laterality: N/A;   ESOPHAGOGASTRODUODENOSCOPY (EGD) WITH PROPOFOL N/A 12/25/2018   Procedure: ESOPHAGOGASTRODUODENOSCOPY (EGD) WITH PROPOFOL;  Surgeon: Malissa Hippo, MD;  Location: [facility];  Service: Endoscopy;  Laterality: N/A;   FLEXIBLE SIGMOIDOSCOPY  10/14/2021   Procedure: FLEXIBLE SIGMOIDOSCOPY;  Surgeon: Dolores Frame, MD;  Location: [facility];  Service: Gastroenterology;;   PATELLA-FEMORAL ARTHROPLASTY Left 10/08/2018   Procedure: PATELLA-FEMORAL ARTHROPLASTY;  Surgeon: Teryl Lucy, MD;  Location: [facility];  Service: Orthopedics;  Laterality: Left;   TUBAL LIGATION     Family History:  Family History  Problem Relation Age of Onset   Gout Paternal Grandfather    Cirrhosis Paternal Grandfather    Hypertension Paternal Grandmother    Aneurysm Paternal Grandmother    Cirrhosis Maternal Grandmother    Cirrhosis Maternal Grandfather    Cancer Father    Cirrhosis Father    Cirrhosis Mother    Breast cancer Sister    Hypertension Sister    Bronchitis Daughter    Bronchitis Daughter    Asthma Son    Bronchitis Son    Migraines Neg Hx    Family Psychiatric  History: None reported Social History:  Social History   Substance and Sexual Activity  Alcohol Use Not Currently   Comment: states quit  06/29/22     Social History   Substance and Sexual Activity  Drug Use Yes   Types: Cocaine   Comment: last used 05/2022    Social History   Socioeconomic History   Marital status: Single    Spouse name: Not on file  Number of children: 3   Years of education: 10   Highest education level: 10th grade  Occupational History   Not on file  Tobacco Use   Smoking status: Former    Current packs/day: 0.00    Average packs/day: 0.3 packs/day for 10.0 years (2.5 ttl pk-yrs)    Types: Cigarettes    Start date: 04/2010    Quit date: 04/2020    Years since quitting: 2.8    Passive exposure:  Never   Smokeless tobacco: Never   Tobacco comments:    Smokes often, especially when using crack cocaine  Vaping Use   Vaping status: Never Used  Substance and Sexual Activity   Alcohol use: Not Currently    Comment: states quit  06/29/22   Drug use: Yes    Types: Cocaine    Comment: last used 05/2022   Sexual activity: Yes    Birth control/protection: Surgical    Comment: tubal, ablation  Other Topics Concern   Not on file  Social History Narrative   R handed    Lives with boyfriend   1 Cup of caffeine daily    Social Determinants of Health   Financial Resource Strain: Not on file  Food Insecurity: Food Insecurity Present (03/03/2023)   Hunger Vital Sign    Worried About Programme researcher, broadcasting/film/video in the Last Year: Often true    Ran Out of Food in the Last Year: Often true  Transportation Needs: Unmet Transportation Needs (03/03/2023)   PRAPARE - Administrator, Civil Service (Medical): Yes    Lack of Transportation (Non-Medical): Yes  Physical Activity: Not on file  Stress: Not on file  Social Connections: Unknown (08/30/2021)   Received from [employer], [facility]   Social Network    Social Network: Not on file   Additional Social History: homeless  Sleep: Poor  Appetite:  Good  Current Medications: Current Facility-Administered Medications  Medication Dose Route Frequency Provider Last Rate Last Admin   alum & mag hydroxide-simeth (MAALOX/MYLANTA) 200-200-20 MG/5ML suspension 30 mL  30 mL Oral Q4H PRN Bobbitt, Shalon E, NP   30 mL at 03/05/23 1212   amLODipine (NORVASC) tablet 5 mg  5 mg Oral Daily Jearld Lesch, NP   5 mg at 03/10/23 0932   aspirin chewable tablet 81 mg  81 mg Oral Daily Dixon, Rashaun M, NP   81 mg at 03/10/23 0932   haloperidol lactate (HALDOL) injection 5 mg  5 mg Intramuscular TID PRN Bobbitt, Shalon E, NP       And   diphenhydrAMINE (BENADRYL) injection 50 mg  50 mg Intramuscular TID PRN Bobbitt, Shalon E, NP       And    LORazepam (ATIVAN) injection 2 mg  2 mg Intramuscular TID PRN Bobbitt, Shalon E, NP       diphenhydrAMINE (BENADRYL) injection 50 mg  50 mg Intramuscular Once PRN Charm Rings, NP       DULoxetine (CYMBALTA) DR capsule 90 mg  90 mg Oral Daily Myriam Forehand, NP   90 mg at 03/10/23 0931   lidocaine (LIDODERM) 5 % 1 patch  1 patch Transdermal Q24H Jearld Lesch, NP   1 patch at 03/09/23 1742   magnesium hydroxide (MILK OF MAGNESIA) suspension 30 mL  30 mL Oral Daily PRN Bobbitt, Shalon E, NP       nicotine polacrilex (NICORETTE) gum 2 mg  2 mg Oral PRN Myriam Forehand, NP  OLANZapine zydis (ZYPREXA) disintegrating tablet 5 mg  5 mg Oral TID PRN Jearld Lesch, NP   5 mg at 03/06/23 2315   pantoprazole (PROTONIX) EC tablet 40 mg  40 mg Oral Daily Lauree Chandler, NP   40 mg at 03/10/23 0931   pregabalin (LYRICA) capsule 150 mg  150 mg Oral BID Lerry Liner M, NP   150 mg at 03/10/23 0931   QUEtiapine (SEROQUEL) tablet 100 mg  100 mg Oral QHS Charm Rings, NP   100 mg at 03/09/23 2100   tiZANidine (ZANAFLEX) tablet 4 mg  4 mg Oral Q6H PRN Jearld Lesch, NP   4 mg at 03/10/23 0102    Lab Results:  No results found for this or any previous visit (from the past 48 hour(s)).   Blood Alcohol level:  Lab Results  Component Value Date   ETH <10 03/01/2023   ETH <10 06/29/2022    Metabolic Disorder Labs: Lab Results  Component Value Date   HGBA1C 5.8 (H) 03/08/2023   MPG 119.76 03/08/2023   MPG 111.15 01/10/2022   No results found for: "PROLACTIN" Lab Results  Component Value Date   CHOL 152 03/08/2023   TRIG 158 (H) 03/08/2023   HDL 47 03/08/2023   CHOLHDL 3.2 03/08/2023   VLDL 32 03/08/2023   LDLCALC 73 03/08/2023   LDLCALC 100 (H) 01/10/2022    Physical Findings: AIMS:  , ,  ,  ,    CIWA:    COWS:  COWS Total Score: 0  Musculoskeletal: Strength & Muscle Tone:  ambulates with walker on unit Gait & Station:  ambulates with walker on unit Patient leans:   ambulates with walker on unit  .Psychiatric Specialty Exam: Physical Exam Vitals and nursing note reviewed.  Constitutional:      General: She is not in acute distress.    Appearance: Normal appearance. She is not ill-appearing, toxic-appearing or diaphoretic.  HENT:     Head: Normocephalic.     Nose: Nose normal.  Eyes:     General: No scleral icterus. Pulmonary:     Effort: Pulmonary effort is normal. No respiratory distress.  Musculoskeletal:        General: Normal range of motion.     Cervical back: Normal range of motion.  Neurological:     General: No focal deficit present.     Mental Status: She is alert and oriented to person, place, and time.  Psychiatric:        Attention and Perception: Attention and perception normal.        Mood and Affect: Mood is anxious. Affect is blunt.        Speech: Speech normal.        Behavior: Behavior normal. Behavior is cooperative.        Thought Content: Thought content normal.        Cognition and Memory: Cognition and memory normal.        Judgment: Judgment normal.     Review of Systems  Constitutional:  Negative for chills and fever.  Respiratory:  Negative for shortness of breath.   Cardiovascular:  Negative for chest pain and palpitations.  Gastrointestinal:  Negative for abdominal pain.  Psychiatric/Behavioral:  Positive for depression and substance abuse.   All other systems reviewed and are negative.   Blood pressure 124/83, pulse 84, temperature 98.8 F (37.1 C), resp. rate 17, height 4\' 11"  (1.499 m), weight 73.9 kg, last menstrual period 06/07/2020, SpO2 100%.Body mass  index is 32.92 kg/m.  General Appearance: Disheveled  Eye Contact:  Fair  Speech:  Normal Rate  Volume:  Normal  Mood:  Depressed, anxious  Affect:  Congruent  Thought Process:  Coherent  Orientation:  Full (Time, Place, and Person)  Thought Content:  WDL  Suicidal Thoughts:  No  Homicidal Thoughts:  No  Memory:  Immediate;   Fair Recent;    Fair Remote;   Fair  Judgement:  Fair  Insight:  Fair  Psychomotor Activity:  Normal  Concentration:  Concentration: Fair and Attention Span: Fair  Recall:  [employer] of Knowledge:  Fair  Language:  Good  Akathisia:  NA  Handed:  Right  AIMS (if indicated):     Assets:  Desire for Improvement Resilience  ADL's:  Intact  Cognition:  WNL  Sleep:  Poor    Physical Exam: Physical Exam Vitals and nursing note reviewed.  Constitutional:      General: She is not in acute distress.    Appearance: Normal appearance. She is not ill-appearing, toxic-appearing or diaphoretic.  HENT:     Head: Normocephalic.     Nose: Nose normal.  Eyes:     General: No scleral icterus. Pulmonary:     Effort: Pulmonary effort is normal. No respiratory distress.  Musculoskeletal:        General: Normal range of motion.     Cervical back: Normal range of motion.  Neurological:     General: No focal deficit present.     Mental Status: She is alert and oriented to person, place, and time.  Psychiatric:        Attention and Perception: Attention and perception normal.        Mood and Affect: Mood is anxious. Affect is blunt.        Speech: Speech normal.        Behavior: Behavior normal. Behavior is cooperative.        Thought Content: Thought content normal.        Cognition and Memory: Cognition and memory normal.        Judgment: Judgment normal.    Review of Systems  Constitutional:  Negative for chills and fever.  Respiratory:  Negative for shortness of breath.   Cardiovascular:  Negative for chest pain and palpitations.  Gastrointestinal:  Negative for abdominal pain.  Psychiatric/Behavioral:  Positive for depression and substance abuse.   All other systems reviewed and are negative.  Blood pressure 124/83, pulse 84, temperature 98.8 F (37.1 C), resp. rate 17, height 4\' 11"  (1.499 m), weight 73.9 kg, last menstrual period 06/07/2020, SpO2 100%. Body mass index is 32.92  kg/m.   Treatment Plan Summary: Daily contact with patient to assess and evaluate symptoms and progress in treatment, Medication management, and Plan   Major depressive disorder, recurrent, severe without psychosis: -Cymbalta 90mg  oral daily for depression, anxiety, and pain  General anxiety disorder: -Gabapentin 300mg  oral daily for anxiety, pain  Agitation: -Continue haldol 5mg  IM 3 times daily PRN agitation, moderate agitation AND benadryl 50mg  IM 3 times daily PRN moderate agitation AND ativan 2mg  IM 3 times daily PRN moderate agitation -Continue zyprexa 5mg  oral 3 times daily PRN mild agitation  Insomnia:  Seroquel 100 mg daily at bedtime   Nanine Means, NP 03/10/2023, 9:38 AM

## 2023-03-10 NOTE — BH IP Treatment Plan (Signed)
Interdisciplinary Treatment and Diagnostic Plan Update  03/10/2023 Time of Session: 10:18 am Kathryn [name] MRN: [id number]  Principal Diagnosis: Major depressive disorder, recurrent severe without psychotic features (HCC)  Secondary Diagnoses: Principal Problem:   Major depressive disorder, recurrent severe without psychotic features (HCC) Active Problems:   Polysubstance abuse (HCC)   Current Medications:  Current Facility-Administered Medications  Medication Dose Route Frequency Provider Last Rate Last Admin   alum & mag hydroxide-simeth (MAALOX/MYLANTA) 200-200-20 MG/5ML suspension 30 mL  30 mL Oral Q4H PRN Bobbitt, Shalon E, NP   30 mL at 03/05/23 1212   amLODipine (NORVASC) tablet 5 mg  5 mg Oral Daily Jearld Lesch, NP   5 mg at 03/10/23 0932   aspirin chewable tablet 81 mg  81 mg Oral Daily Dixon, Rashaun M, NP   81 mg at 03/10/23 0932   haloperidol lactate (HALDOL) injection 5 mg  5 mg Intramuscular TID PRN Bobbitt, Shalon E, NP       And   diphenhydrAMINE (BENADRYL) injection 50 mg  50 mg Intramuscular TID PRN Bobbitt, Shalon E, NP       And   LORazepam (ATIVAN) injection 2 mg  2 mg Intramuscular TID PRN Bobbitt, Shalon E, NP       diphenhydrAMINE (BENADRYL) injection 50 mg  50 mg Intramuscular Once PRN Charm Rings, NP       DULoxetine (CYMBALTA) DR capsule 90 mg  90 mg Oral Daily Myriam Forehand, NP   90 mg at 03/10/23 0931   lidocaine (LIDODERM) 5 % 1 patch  1 patch Transdermal Q24H Jearld Lesch, NP   1 patch at 03/09/23 1742   magnesium hydroxide (MILK OF MAGNESIA) suspension 30 mL  30 mL Oral Daily PRN Bobbitt, Shalon E, NP       nicotine polacrilex (NICORETTE) gum 2 mg  2 mg Oral PRN Myriam Forehand, NP       OLANZapine zydis (ZYPREXA) disintegrating tablet 5 mg  5 mg Oral TID PRN Jearld Lesch, NP   5 mg at 03/06/23 2315   pantoprazole (PROTONIX) EC tablet 40 mg  40 mg Oral Daily Lauree Chandler, NP   40 mg at 03/10/23 0931   pregabalin (LYRICA) capsule  150 mg  150 mg Oral BID Lerry Liner M, NP   150 mg at 03/10/23 0931   QUEtiapine (SEROQUEL) tablet 100 mg  100 mg Oral QHS Charm Rings, NP   100 mg at 03/09/23 2100   tiZANidine (ZANAFLEX) tablet 4 mg  4 mg Oral Q6H PRN Jearld Lesch, NP   4 mg at 03/10/23 0931   PTA Medications: Medications Prior to Admission  Medication Sig Dispense Refill Last Dose   albuterol (PROVENTIL) (2.5 MG/3ML) 0.083% nebulizer solution Take 3 mLs (2.5 mg total) by nebulization every 6 (six) hours as needed for wheezing or shortness of breath. (Patient not taking: Reported on 01/17/2023) 75 mL 12    albuterol (VENTOLIN HFA) 108 (90 Base) MCG/ACT inhaler Inhale 1-2 puffs into the lungs every 6 (six) hours as needed for wheezing or shortness of breath. 17 each 0    amLODipine (NORVASC) 10 MG tablet Take 10 mg by mouth daily.      aspirin 81 MG chewable tablet Chew 1 tablet (81 mg total) by mouth daily. (Patient not taking: Reported on 01/17/2023) 30 tablet 0    cholecalciferol (VITAMIN D3) 25 MCG (1000 UNIT) tablet Take 1,000 Units by mouth daily. (Patient not taking: Reported on  01/17/2023)      DULoxetine (CYMBALTA) 60 MG capsule Take 1 capsule (60 mg total) by mouth daily. 30 capsule 0    fluticasone furoate-vilanterol (BREO ELLIPTA) 200-25 MCG/ACT AEPB Inhale 1 puff into the lungs daily. 180 each 10    hydrOXYzine (ATARAX) 50 MG tablet Take 2 tablets (100 mg total) by mouth every 6 (six) hours as needed for anxiety. 30 tablet 0    lidocaine (LIDODERM) 5 % Place 1 patch onto the skin daily. Remove & Discard patch within 12 hours or as directed by MD (Patient not taking: Reported on 01/17/2023) 30 patch 0    Lurasidone HCl 60 MG TABS Take 60 mg by mouth daily with breakfast.      methocarbamol (ROBAXIN) 500 MG tablet Take 500 mg by mouth 2 (two) times daily as needed for muscle spasms.      methylPREDNISolone (MEDROL DOSEPAK) 4 MG TBPK tablet Use per package directions. 21 each 0    mirtazapine (REMERON) 15 MG  tablet Take 15 mg by mouth at bedtime.      omeprazole (PRILOSEC) 20 MG capsule Take 20 mg by mouth daily before breakfast.      oxyCODONE (ROXICODONE) 5 MG immediate release tablet Take 1 tablet (5 mg total) by mouth every 6 (six) hours as needed for breakthrough pain. 10 tablet 0    pantoprazole (PROTONIX) 40 MG tablet Take 1 tablet (40 mg total) by mouth daily. (Patient not taking: Reported on 01/17/2023) 30 tablet 0    pregabalin (LYRICA) 150 MG capsule Take 1 capsule (150 mg total) by mouth 2 (two) times daily. 60 capsule 0    ramelteon (ROZEREM) 8 MG tablet Take 1 tablet (8 mg total) by mouth at bedtime. (Patient not taking: Reported on 01/17/2023) 30 tablet 0    tiZANidine (ZANAFLEX) 2 MG tablet Take 4 mg by mouth every 6 (six) hours as needed for muscle spasms.      [DISCONTINUED] amLODipine (NORVASC) 5 MG tablet Take 1 tablet (5 mg total) by mouth daily. 30 tablet 0    [DISCONTINUED] gabapentin (NEURONTIN) 300 MG capsule Take 1 capsule (300 mg total) by mouth daily. 12 capsule 0     Patient Stressors:    Patient Strengths:    Treatment Modalities: Medication Management, Group therapy, Case management,  1 to 1 session with clinician, Psychoeducation, Recreational therapy.   Physician Treatment Plan for Primary Diagnosis: Major depressive disorder, recurrent severe without psychotic features (HCC) Long Term Goal(s): Improvement in symptoms so as ready for discharge   Short Term Goals: Ability to identify changes in lifestyle to reduce recurrence of condition will improve Ability to verbalize feelings will improve Ability to disclose and discuss suicidal ideas Ability to demonstrate self-control will improve Ability to identify and develop effective coping behaviors will improve Ability to maintain clinical measurements within normal limits will improve Compliance with prescribed medications will improve Ability to identify triggers associated with substance abuse/mental health  issues will improve  Medication Management: Evaluate patient's response, side effects, and tolerance of medication regimen.  Therapeutic Interventions: 1 to 1 sessions, Unit Group sessions and Medication administration.  Evaluation of Outcomes: Progressing  Physician Treatment Plan for Secondary Diagnosis: Principal Problem:   Major depressive disorder, recurrent severe without psychotic features (HCC) Active Problems:   Polysubstance abuse (HCC)  Long Term Goal(s): Improvement in symptoms so as ready for discharge   Short Term Goals: Ability to identify changes in lifestyle to reduce recurrence of condition will improve Ability to verbalize feelings  will improve Ability to disclose and discuss suicidal ideas Ability to demonstrate self-control will improve Ability to identify and develop effective coping behaviors will improve Ability to maintain clinical measurements within normal limits will improve Compliance with prescribed medications will improve Ability to identify triggers associated with substance abuse/mental health issues will improve     Medication Management: Evaluate patient's response, side effects, and tolerance of medication regimen.  Therapeutic Interventions: 1 to 1 sessions, Unit Group sessions and Medication administration.  Evaluation of Outcomes: Progressing   RN Treatment Plan for Primary Diagnosis: Major depressive disorder, recurrent severe without psychotic features (HCC) Long Term Goal(s): Knowledge of disease and therapeutic regimen to maintain health will improve  Short Term Goals: Ability to remain free from injury will improve, Ability to verbalize frustration and anger appropriately will improve, Ability to demonstrate self-control, Ability to participate in decision making will improve, Ability to verbalize feelings will improve, Ability to disclose and discuss suicidal ideas, Ability to identify and develop effective coping behaviors will improve,  and Compliance with prescribed medications will improve  Medication Management: RN will administer medications as ordered by provider, will assess and evaluate patient's response and provide education to patient for prescribed medication. RN will report any adverse and/or side effects to prescribing provider.  Therapeutic Interventions: 1 on 1 counseling sessions, Psychoeducation, Medication administration, Evaluate responses to treatment, Monitor vital signs and CBGs as ordered, Perform/monitor CIWA, COWS, AIMS and Fall Risk screenings as ordered, Perform wound care treatments as ordered.  Evaluation of Outcomes: Progressing   LCSW Treatment Plan for Primary Diagnosis: Major depressive disorder, recurrent severe without psychotic features (HCC) Long Term Goal(s): Safe transition to appropriate next level of care at discharge, Engage patient in therapeutic group addressing interpersonal concerns.  Short Term Goals: Engage patient in aftercare planning with referrals and resources, Increase social support, Increase ability to appropriately verbalize feelings, Increase emotional regulation, Facilitate acceptance of mental health diagnosis and concerns, Facilitate patient progression through stages of change regarding substance use diagnoses and concerns, and Identify triggers associated with mental health/substance abuse issues  Therapeutic Interventions: Assess for all discharge needs, 1 to 1 time with Social worker, Explore available resources and support systems, Assess for adequacy in community support network, Educate family and significant other(s) on suicide prevention, Complete Psychosocial Assessment, Interpersonal group therapy.  Evaluation of Outcomes: Progressing   Progress in Treatment: Attending groups: No. Participating in groups: No. Taking medication as prescribed: Yes. Toleration medication: Yes. Family/Significant other contact made: No, will contact:  as pt refused to  consent to family contact. SPI pamphlet provided to pt and pt was encouraged to share information. Patient understands diagnosis: Yes. Discussing patient identified problems/goals with staff: Yes. Medical problems stabilized or resolved: Yes. Denies suicidal/homicidal ideation: Yes. Issues/concerns per patient self-inventory: No. Other: none  New problem(s) identified: No, Describe:  none Update 03/10/23: No Changes at this time.    New Short Term/Long Term Goal(s): detox, elimination of symptoms of psychosis, medication management for mood stabilization; elimination of SI thoughts; development of comprehensive mental wellness/sobriety plan. Update 03/10/23: No Changes at this time.    Patient Goals:  "work on depression my suicide attempts, housing, pain management" Update 03/10/23: No Changes at this time.    Discharge Plan or Barriers: CSW to assist in the development of appropriate discharge plans.  Update 03/10/23: The patient anticipated discharge date is 03/12/23.    Reason for Continuation of Hospitalization: Anxiety Depression Medication stabilization Suicidal ideation Withdrawal symptoms   Estimated Length of Stay:  1-7 days Update 03/10/23: No Changes at this time.   Last 3 [place] Suicide Severity Risk Score: Flowsheet Row Admission (Current) from 03/03/2023 in [facility] ED from 03/01/2023 in [facility] Emergency Department at [facility] ED from 02/23/2023 in [facility] Emergency Department at [facility]  C-SSRS RISK CATEGORY High Risk High Risk No Risk       Last [facility] 2/9 Scores:    02/02/2022    9:20 AM 12/20/2021   11:26 AM 11/03/2021    9:10 AM  Depression screen PHQ 2/9  Decreased Interest 0 3 0  Down, Depressed, Hopeless 0 3 0  PHQ - 2 Score 0 6 0  Altered sleeping 0 3   Tired, decreased energy 0 3   Change in appetite 0 3   Feeling bad or failure about yourself  0 3   Trouble concentrating 0 3   Moving slowly or  fidgety/restless 0 0   Suicidal thoughts 0 3   PHQ-9 Score 0 24   Difficult doing work/chores Not difficult at all Very difficult     Scribe for Treatment Team: Marshell Levan, LCSW 03/10/2023 10:18 AM

## 2023-03-10 NOTE — Group Note (Signed)
Date:  03/10/2023 Time:  11:50 AM  Group Topic/Focus:  Goals Group:   The focus of this group is to help patients establish daily goals to achieve during treatment and discuss how the patient can incorporate goal setting into their daily lives to aide in recovery. Making Healthy Choices:   The focus of this group is to help patients identify negative/unhealthy choices they were using prior to admission and identify positive/healthier coping strategies to replace them upon discharge.    Participation Level:  Did Not Attend   Lynelle Smoke [facility] 03/10/2023, 11:50 AM

## 2023-03-10 NOTE — Group Note (Signed)
Date:  03/10/2023 Time:  4:05 PM  Group Topic/Focus:  Wellness Toolbox:   The focus of this group is to discuss various aspects of wellness, balancing those aspects and exploring ways to increase the ability to experience wellness.  Patients will create a wellness toolbox for use upon discharge. Outdoor Recreation Activity    Participation Level:  Did Not Attend   Kathryn [name] 03/10/2023, 4:05 PM

## 2023-03-10 NOTE — Progress Notes (Signed)
   03/09/23 2100  Psych Admission Type (Psych Patients Only)  Admission Status Voluntary  Psychosocial Assessment  Patient Complaints Anxiety  Eye Contact Brief  Facial Expression Animated  Affect Anxious;Appropriate to circumstance;Preoccupied  Speech Logical/coherent;Soft  Interaction Assertive  Motor Activity Slow  Appearance/Hygiene Unremarkable  Behavior Characteristics Appropriate to situation  Thought Process  Coherency WDL  Content WDL  Delusions None reported or observed  Perception WDL  Hallucination None reported or observed  Judgment WDL  Confusion WDL  Danger to Self  Current suicidal ideation? Denies  Agreement Not to Harm Self Yes  Description of Agreement verbal  Danger to Others  Danger to Others None reported or observed  Danger to Others Abnormal  Harmful Behavior to others No threats or harm toward other people  Destructive Behavior Threats of violence towards property observed or expressed    Patient is alert and oriented x 4, affect is flat, her thoughts are organized and coherent, she expressed pan and discomfort in her lower extremities,assessment  reveal + 1 edema in both extremities she expressed that she uses lasix in the past. Patient legs were elevated on the pillows and given medication for discomfort. 15 minutes safety checks maintained will continue to monitor.

## 2023-03-11 DIAGNOSIS — F332 Major depressive disorder, recurrent severe without psychotic features: Secondary | ICD-10-CM | POA: Diagnosis not present

## 2023-03-11 NOTE — Progress Notes (Signed)
[facility] MD Progress Note  03/11/2023 9:08 AM Kathryn [name]  MRN:  [id number]  Subjective:  Pt chart reviewed, discussed with interdisciplinary team, and seen on rounds. Found Kathryn [name] this morning in the day room interacting with the other clients. She walked back to her room easily w/ the support of a walker. Kathryn [name] was alert, pleasant, and cooperative during the interview this morning. She had a smile on her face. "I am feeling happy and wealthy." She denies depression/anxiety, hallucinations, and delusions. She denies suicidal or homicidal ideation. Kathryn [name] slept better last night and ate breakfast this morning. She is planning to continue her rehab at [facility] on Monday.  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, insomnia, generalized anxiety disorder, major depressive disorder polysubstance abuse  Past Medical History:  Past Medical History:  Diagnosis Date   Anxiety    Arthritis    Asthma    Chronic back pain    COPD (chronic obstructive pulmonary disease) (HCC)    Chronic bronchitis   Depression    Dyspnea    GERD (gastroesophageal reflux disease)    Hypertension    Migraine    Neuropathy    Osteoarthritis of left knee, 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   Single subsegmental pulmonary embolism without acute cor pulmonale (HCC) 06/20/2021   Sleep apnea 04/2020   GETTING A cpap   Suicidal ideation 12/24/2018    Past Surgical History:  Procedure Laterality Date   BACK SURGERY     DILATATION AND CURETTAGE/HYSTEROSCOPY WITH MINERVA N/A 06/09/2020   Procedure: DILATATION AND CURETTAGE/HYSTEROSCOPY WITH MINERVA;  Surgeon: Lazaro Arms, MD;   Location: [facility];  Service: Gynecology;  Laterality: N/A;   ESOPHAGOGASTRODUODENOSCOPY (EGD) WITH PROPOFOL N/A 12/25/2018   Procedure: ESOPHAGOGASTRODUODENOSCOPY (EGD) WITH PROPOFOL;  Surgeon: Malissa Hippo, MD;  Location: [facility] SU[facility];  Service: Endoscopy;  Laterality: N/A;   FLEXIBLE SIGMOIDOSCOPY  10/14/2021   Procedure: FLEXIBLE SIGMOIDOSCOPY;  Surgeon: Dolores Frame, MD;  Location: [facility];  Service: Gastroenterology;;   PATELLA-FEMORAL ARTHROPLASTY Left 10/08/2018   Procedure: PATELLA-FEMORAL ARTHROPLASTY;  Surgeon: Teryl Lucy, MD;  Location: [facility];  Service: Orthopedics;  Laterality: Left;   TUBAL LIGATION     Family History:  Family History  Problem Relation Age of Onset   Gout Paternal Grandfather    Cirrhosis Paternal Grandfather    Hypertension Paternal Grandmother    Aneurysm Paternal Grandmother    Cirrhosis Maternal Grandmother    Cirrhosis Maternal Grandfather    Cancer Father    Cirrhosis Father    Cirrhosis Mother    Breast cancer Sister    Hypertension Sister    Bronchitis Daughter    Bronchitis Daughter    Asthma Son    Bronchitis Son    Migraines Neg Hx    Family Psychiatric  History: None reported Social History:  Social History   Substance and Sexual Activity  Alcohol Use Not Currently   Comment: states quit  06/29/22     Social History   Substance and Sexual Activity  Drug Use Yes   Types: Cocaine   Comment: last used 05/2022    Social History   Socioeconomic History   Marital status: Single    Spouse name: Not on file  Number of children: 3   Years of education: 10   Highest education level: 10th grade  Occupational History   Not on file  Tobacco Use   Smoking status: Former    Current packs/day: 0.00    Average packs/day: 0.3 packs/day for 10.0 years (2.5 ttl pk-yrs)    Types: Cigarettes    Start date: 04/2010    Quit date: 04/2020    Years since quitting: 2.8    Passive exposure: Never   Smokeless  tobacco: Never   Tobacco comments:    Smokes often, especially when using crack cocaine  Vaping Use   Vaping status: Never Used  Substance and Sexual Activity   Alcohol use: Not Currently    Comment: states quit  06/29/22   Drug use: Yes    Types: Cocaine    Comment: last used 05/2022   Sexual activity: Yes    Birth control/protection: Surgical    Comment: tubal, ablation  Other Topics Concern   Not on file  Social History Narrative   R handed    Lives with boyfriend   1 Cup of caffeine daily    Social Determinants of Health   Financial Resource Strain: Not on file  Food Insecurity: Food Insecurity Present (03/03/2023)   Hunger Vital Sign    Worried About Programme researcher, broadcasting/film/video in the Last Year: Often true    Ran Out of Food in the Last Year: Often true  Transportation Needs: Unmet Transportation Needs (03/03/2023)   PRAPARE - Administrator, Civil Service (Medical): Yes    Lack of Transportation (Non-Medical): Yes  Physical Activity: Not on file  Stress: Not on file  Social Connections: Unknown (08/30/2021)   Received from [employer]   Social Network    Social Network: Not on file   Additional Social History: homeless  Sleep: Poor  Appetite:  Good  Current Medications: Current Facility-Administered Medications  Medication Dose Route Frequency Provider Last Rate Last Admin   alum & mag hydroxide-simeth (MAALOX/MYLANTA) 200-200-20 MG/5ML suspension 30 mL  30 mL Oral Q4H PRN Bobbitt, Shalon E, NP   30 mL at 03/05/23 1212   amLODipine (NORVASC) tablet 5 mg  5 mg Oral Daily Jearld Lesch, NP   5 mg at 03/11/23 0845   aspirin chewable tablet 81 mg  81 mg Oral Daily Dixon, Rashaun M, NP   81 mg at 03/11/23 0844   haloperidol lactate (HALDOL) injection 5 mg  5 mg Intramuscular TID PRN Bobbitt, Shalon E, NP       And   diphenhydrAMINE (BENADRYL) injection 50 mg  50 mg Intramuscular TID PRN Bobbitt, Shalon E, NP       And   LORazepam (ATIVAN)  injection 2 mg  2 mg Intramuscular TID PRN Bobbitt, Shalon E, NP       diphenhydrAMINE (BENADRYL) injection 50 mg  50 mg Intramuscular Once PRN Charm Rings, NP       DULoxetine (CYMBALTA) DR capsule 90 mg  90 mg Oral Daily Myriam Forehand, NP   90 mg at 03/11/23 0844   lidocaine (LIDODERM) 5 % 1 patch  1 patch Transdermal Q24H Jearld Lesch, NP   1 patch at 03/10/23 1715   magnesium hydroxide (MILK OF MAGNESIA) suspension 30 mL  30 mL Oral Daily PRN Bobbitt, Shalon E, NP       nicotine polacrilex (NICORETTE) gum 2 mg  2 mg Oral PRN Myriam Forehand, NP  OLANZapine zydis (ZYPREXA) disintegrating tablet 5 mg  5 mg Oral TID PRN Jearld Lesch, NP   5 mg at 03/06/23 2315   pantoprazole (PROTONIX) EC tablet 40 mg  40 mg Oral Daily Lauree Chandler, NP   40 mg at 03/11/23 0844   pregabalin (LYRICA) capsule 150 mg  150 mg Oral BID Lerry Liner M, NP   150 mg at 03/11/23 0844   QUEtiapine (SEROQUEL) tablet 100 mg  100 mg Oral QHS Charm Rings, NP   100 mg at 03/10/23 2154   tiZANidine (ZANAFLEX) tablet 4 mg  4 mg Oral Q6H PRN Jearld Lesch, NP   4 mg at 03/10/23 2154    Lab Results:  No results found for this or any previous visit (from the past 48 hour(s)).   Blood Alcohol level:  Lab Results  Component Value Date   ETH <10 03/01/2023   ETH <10 06/29/2022    Metabolic Disorder Labs: Lab Results  Component Value Date   HGBA1C 5.8 (H) 03/08/2023   MPG 119.76 03/08/2023   MPG 111.15 01/10/2022   No results found for: "PROLACTIN" Lab Results  Component Value Date   CHOL 152 03/08/2023   TRIG 158 (H) 03/08/2023   HDL 47 03/08/2023   CHOLHDL 3.2 03/08/2023   VLDL 32 03/08/2023   LDLCALC 73 03/08/2023   LDLCALC 100 (H) 01/10/2022    Physical Findings: AIMS:  , ,  ,  ,    CIWA:    COWS:  COWS Total Score: 0  Musculoskeletal: Strength & Muscle Tone:  ambulates with walker on unit Gait & Station:  ambulates with walker on unit Patient leans:  ambulates with  walker on unit  .Psychiatric Specialty Exam: Physical Exam Vitals and nursing note reviewed.  Constitutional:      General: She is not in acute distress.    Appearance: Normal appearance. She is not ill-appearing, toxic-appearing or diaphoretic.  HENT:     Head: Normocephalic.     Nose: Nose normal.  Eyes:     General: No scleral icterus. Pulmonary:     Effort: Pulmonary effort is normal. No respiratory distress.  Musculoskeletal:        General: Normal range of motion.     Cervical back: Normal range of motion.  Neurological:     General: No focal deficit present.     Mental Status: She is alert and oriented to person, place, and time.  Psychiatric:        Attention and Perception: Attention and perception normal.        Mood and Affect: Mood and affect normal.        Speech: Speech normal.        Behavior: Behavior normal. Behavior is cooperative.        Thought Content: Thought content normal.        Cognition and Memory: Cognition and memory normal.        Judgment: Judgment normal.     Review of Systems  Constitutional:  Negative for chills and fever.  Respiratory:  Negative for shortness of breath.   Cardiovascular:  Negative for chest pain and palpitations.  Gastrointestinal:  Negative for abdominal pain.  Psychiatric/Behavioral:  Positive for substance abuse.   All other systems reviewed and are negative.   Blood pressure 103/63, pulse 78, temperature 98.1 F (36.7 C), temperature source Oral, resp. rate 17, height 4\' 11"  (1.499 m), weight 73.9 kg, last menstrual period 06/07/2020, SpO2 98%.Body mass index  is 32.92 kg/m.  General Appearance: Fairly Groomed  Eye Contact:  Good  Speech:  Normal Rate  Volume:  Normal  Mood: Normal  Affect:  Congruent  Thought Process:  Coherent  Orientation:  Full (Time, Place, and Person)  Thought Content:  WDL  Suicidal Thoughts:  No  Homicidal Thoughts:  No  Memory:  Immediate;   Good Recent;   Good Remote;   Good   Judgement:  Good  Insight:  Good and Fair  Psychomotor Activity:  Normal  Concentration:  Concentration: Good and Attention Span: Good  Recall:  Good  Fund of Knowledge:  Good  Language:  Good  Akathisia:  NA  Handed:  Right  AIMS (if indicated):     Assets:  Desire for Improvement Resilience  ADL's:  Intact  Cognition:  WNL  Sleep:  Fair    Physical Exam: Physical Exam Vitals and nursing note reviewed.  Constitutional:      General: She is not in acute distress.    Appearance: Normal appearance. She is not ill-appearing, toxic-appearing or diaphoretic.  HENT:     Head: Normocephalic.     Nose: Nose normal.  Eyes:     General: No scleral icterus. Pulmonary:     Effort: Pulmonary effort is normal. No respiratory distress.  Musculoskeletal:        General: Normal range of motion.     Cervical back: Normal range of motion.  Neurological:     General: No focal deficit present.     Mental Status: She is alert and oriented to person, place, and time.  Psychiatric:        Attention and Perception: Attention and perception normal.        Mood and Affect: Mood and affect normal.        Speech: Speech normal.        Behavior: Behavior normal. Behavior is cooperative.        Thought Content: Thought content normal.        Cognition and Memory: Cognition and memory normal.        Judgment: Judgment normal.    Review of Systems  Constitutional:  Negative for chills and fever.  Respiratory:  Negative for shortness of breath.   Cardiovascular:  Negative for chest pain and palpitations.  Gastrointestinal:  Negative for abdominal pain.  Psychiatric/Behavioral:  Positive for substance abuse.   All other systems reviewed and are negative.  Blood pressure 103/63, pulse 78, temperature 98.1 F (36.7 C), temperature source Oral, resp. rate 17, height 4\' 11"  (1.499 m), weight 73.9 kg, last menstrual period 06/07/2020, SpO2 98%. Body mass index is 32.92 kg/m.   Treatment Plan  Summary: Daily contact with patient to assess and evaluate symptoms and progress in treatment, Medication management, and Plan   Major depressive disorder, recurrent, severe without psychosis: -Cymbalta 90mg  oral daily for depression, anxiety, and pain  General anxiety disorder: -Gabapentin 300mg  oral daily for anxiety, pain  Agitation: -Continue haldol 5mg  IM 3 times daily PRN agitation, moderate agitation AND benadryl 50mg  IM 3 times daily PRN moderate agitation AND ativan 2mg  IM 3 times daily PRN moderate agitation -Continue zyprexa 5mg  oral 3 times daily PRN mild agitation  Insomnia: Seroquel 100 mg daily at bedtime   Nanine Means, NP 03/11/2023, 9:08 AM

## 2023-03-11 NOTE — Progress Notes (Signed)
   03/10/23 2000  Psych Admission Type (Psych Patients Only)  Admission Status Voluntary  Psychosocial Assessment  Patient Complaints None  Eye Contact Brief  Facial Expression Animated  Affect Anxious;Appropriate to circumstance;Preoccupied  Speech Logical/coherent;Soft  Interaction Assertive  Motor Activity Slow  Appearance/Hygiene Unremarkable  Behavior Characteristics Cooperative;Appropriate to situation  Mood Pleasant  Thought Process  Coherency WDL  Content WDL  Delusions None reported or observed  Perception WDL  Hallucination None reported or observed  Judgment WDL  Confusion WDL  Danger to Self  Current suicidal ideation? Denies  Agreement Not to Harm Self Yes  Description of Agreement verbal  Danger to Others  Danger to Others None reported or observed  Danger to Others Abnormal  Harmful Behavior to others No threats or harm toward other people  Destructive Behavior Threats of violence towards property observed or expressed    Patient alert and oriented x 4 no distress noted, she denies SI/HI/AVH/. Patient's thoughts are organized and coherent. 15 minutes safety checks maintained.

## 2023-03-11 NOTE — Plan of Care (Signed)
Patient pleasant and cooperative on approach.Patient stated that she is excited to go to [facility] tomorrow. Patient verbalized no issues. Denies SI,HI and AVH. Visible in the milieu. Appropriate with staff & peers. Appetite and energy level good. Support and encouragement given.

## 2023-03-11 NOTE — Group Note (Signed)
Date:  03/11/2023 Time:  9:38 PM  Group Topic/Focus:  Building Self Esteem:   The Focus of this group is helping patients become aware of the effects of self-esteem on their lives, the things they and others do that enhance or undermine their self-esteem, seeing the relationship between their level of self-esteem and the choices they make and learning ways to enhance self-esteem.    Participation Level:  Did Not Attend  Participation Quality:   none  Affect:   none  Cognitive:   none  Insight: None  Engagement in Group:   none  Modes of Intervention:   none  Additional Comments:  none   Marwa Fuhrman 03/11/2023, 9:38 PM

## 2023-03-11 NOTE — Group Note (Unsigned)
Date:  03/11/2023 Time:  2:26 AM  Group Topic/Focus:  Wrap-Up Group:   The focus of this group is to help patients review their daily goal of treatment and discuss progress on daily workbooks.     Participation Level:  {BHH PARTICIPATION ZOXWR:60454}  Participation Quality:  {BHH PARTICIPATION QUALITY:22265}  Affect:  {BHH AFFECT:22266}  Cognitive:  {BHH COGNITIVE:22267}  Insight: {BHH Insight2:20797}  Engagement in Group:  {BHH ENGAGEMENT IN UJWJX:91478}  Modes of Intervention:  {BHH MODES OF INTERVENTION:22269}  Additional Comments:  ***  Lenore Cordia 03/11/2023, 2:26 AM

## 2023-03-11 NOTE — Group Note (Signed)
Date:  03/11/2023 Time:  1:22 PM  Group Topic/Focus:  Goals Group:   The focus of this group is to help patients establish daily goals to achieve during treatment and discuss how the patient can incorporate goal setting into their daily lives to aide in recovery. Group community meeting    Participation Level:  Did Not Attend    Rosaura Carpenter 03/11/2023, 1:22 PM

## 2023-03-11 NOTE — Group Note (Signed)
Date:  03/11/2023 Time:  6:08 PM  Group Topic/Focus:  OUTDOOR RECREATION STRUCTURED ACTIVITY    Participation Level:  Did Not Attend   Kathryn [name] 03/11/2023, 6:08 PM

## 2023-03-11 NOTE — Plan of Care (Signed)
Problem: Education: Goal: Emotional status will improve Outcome: Progressing   Problem: Activity: Goal: Interest or engagement in activities will improve Outcome: Progressing

## 2023-03-12 DIAGNOSIS — F332 Major depressive disorder, recurrent severe without psychotic features: Secondary | ICD-10-CM | POA: Diagnosis not present

## 2023-03-12 NOTE — Progress Notes (Signed)
  [address] [facility] Adult Case Management Discharge Plan :  Will you be returning to the same living situation after discharge:  No. At discharge, do you have transportation home?: Yes,  CSW to assist with transportation needs. Do you have the ability to pay for your medications:  [employer] MEDICARE / [facility] MEDICARE HMO   Release of information consent forms completed and in the chart;  Patient's signature needed at discharge.  Patient to Follow up at:  Follow-up Information     [facility] Go to.   Specialty: Addiction Medicine Why: By 87FI 03/12/23. Contact information: [address] [place] [address] [phone]                 Next level of care provider has access to [facility] Link:no  Safety Planning and Suicide Prevention discussed: Yes,  SPE completed with the patinet.  Patient declined collateral contact.     Has patient been referred to the Quitline?: Patient refused referral for treatment  Patient has been referred for addiction treatment: Yes, referral information appointment is 03/11/2026 at 12PM   Harden Mo, LCSW 03/12/2023, 11:17 AM

## 2023-03-12 NOTE — Progress Notes (Signed)
Patient pleasant and cooperative on approach. Denies SI,HI and AVH. Verbalized understanding discharge instructions,prescriptions and follow up care. 7 days medicines given to patient. All belongings returned from [employer]. Suicide safety plan filled by patient and placed in chart. Copy given to patient.Patient escorted out by staff and transported by safe transport.

## 2023-03-12 NOTE — Progress Notes (Signed)
   03/11/23 2000  Psych Admission Type (Psych Patients Only)  Admission Status Voluntary  Psychosocial Assessment  Patient Complaints None  Eye Contact Brief  Facial Expression Animated  Affect Anxious;Appropriate to circumstance;Preoccupied  Speech Logical/coherent;Soft  Interaction Assertive  Motor Activity Slow  Appearance/Hygiene Unremarkable  Behavior Characteristics Cooperative;Appropriate to situation  Mood Pleasant  Thought Process  Coherency WDL  Content WDL  Delusions None reported or observed  Perception WDL  Hallucination None reported or observed  Judgment WDL  Confusion WDL  Danger to Self  Current suicidal ideation? Denies  Agreement Not to Harm Self Yes  Description of Agreement verbal  Danger to Others  Danger to Others None reported or observed  Danger to Others Abnormal  Harmful Behavior to others No threats or harm toward other people  Destructive Behavior Threats of violence towards property observed or expressed    Patient alert and oriented x 4, affect is flat but brightens upon approach, thoughts are organized and coherent, denies SI/HI/AVH.

## 2023-03-12 NOTE — Plan of Care (Signed)
  Problem: Education: Goal: Emotional status will improve Outcome: Progressing   Problem: Education: Goal: Knowledge of [facility] Education information/materials will improve Outcome: Progressing   Problem: Education: Goal: Mental status will improve Outcome: Progressing

## 2023-03-13 ENCOUNTER — Telehealth: Payer: Self-pay

## 2023-03-13 NOTE — Transitions of Care (Post Inpatient/ED Visit) (Signed)
   03/13/2023  Name: Kathryn [name] MRN: [id number] DOB: Jul 07, 1970  Today's TOC FU Call Status: Today's TOC FU Call Status:: Unsuccessful Call (1st Attempt) Unsuccessful Call (1st Attempt) Date: 03/13/23  Attempted to reach the patient regarding the most recent Inpatient/ED visit.  Follow Up Plan: Additional outreach attempts will be made to reach the patient to complete the Transitions of Care (Post Inpatient/ED visit) call.   Lonia Chimera, RN, BSN, CEN [employer]- Transition of Care Team.  [facility] [phone]

## 2023-03-14 ENCOUNTER — Telehealth: Payer: Self-pay | Admitting: *Deleted

## 2023-03-14 NOTE — Transitions of Care (Post Inpatient/ED Visit) (Signed)
   03/14/2023  Name: Kathryn [name] MRN: [id number] DOB: [birth year]  Today's TOC FU Call Status: Today's TOC FU Call Status:: Unsuccessful Call (2nd Attempt) Unsuccessful Call (2nd Attempt) Date: 03/14/23  Attempted to reach the patient regarding the most recent Inpatient/ED visit.  Follow Up Plan: Additional outreach attempts will be made to reach the patient to complete the Transitions of Care (Post Inpatient/ED visit) call.   Irving Shows [facility], BSN RN Care Manager/ Transition of Care [place]/ [facility] [phone]

## 2023-03-16 ENCOUNTER — Telehealth: Payer: Self-pay

## 2023-03-16 NOTE — Transitions of Care (Post Inpatient/ED Visit) (Signed)
   03/16/2023  Name: Kathryn [name] MRN: [id number] DOB: January 21, 1971  Today's TOC FU Call Status: Today's TOC FU Call Status:: Unsuccessful Call (3rd Attempt) Unsuccessful Call (3rd Attempt) Date: 03/16/23  Attempted to reach the patient regarding the most recent Inpatient/ED visit.  Follow Up Plan: No further outreach attempts will be made at this time. We have been unable to contact the patient.     Antionette Fairy, RN,BSN,CCM RN Care Manager Transitions of Care  [facility]  Direct Phone: [phone] Toll Free: [phone] Fax: [phone]

## 2023-03-25 ENCOUNTER — Encounter (HOSPITAL_COMMUNITY): Payer: Self-pay

## 2023-03-25 ENCOUNTER — Other Ambulatory Visit: Payer: Self-pay

## 2023-03-25 ENCOUNTER — Emergency Department (HOSPITAL_COMMUNITY)
Admission: EM | Admit: 2023-03-25 | Discharge: 2023-03-26 | Disposition: A | Discharge status: Other Acute Inpatient Hospital | Payer: Medicare HMO | Attending: Emergency Medicine | Admitting: Emergency Medicine

## 2023-03-25 DIAGNOSIS — R197 Diarrhea, unspecified: Secondary | ICD-10-CM | POA: Diagnosis present

## 2023-03-25 DIAGNOSIS — G629 Polyneuropathy, unspecified: Secondary | ICD-10-CM | POA: Diagnosis not present

## 2023-03-25 DIAGNOSIS — J449 Chronic obstructive pulmonary disease, unspecified: Secondary | ICD-10-CM | POA: Diagnosis not present

## 2023-03-25 DIAGNOSIS — M79604 Pain in right leg: Secondary | ICD-10-CM | POA: Insufficient documentation

## 2023-03-25 DIAGNOSIS — F32A Depression, unspecified: Secondary | ICD-10-CM | POA: Insufficient documentation

## 2023-03-25 DIAGNOSIS — Z91010 Allergy to peanuts: Secondary | ICD-10-CM | POA: Insufficient documentation

## 2023-03-25 DIAGNOSIS — R45851 Suicidal ideations: Secondary | ICD-10-CM | POA: Insufficient documentation

## 2023-03-25 DIAGNOSIS — M79605 Pain in left leg: Secondary | ICD-10-CM | POA: Insufficient documentation

## 2023-03-25 DIAGNOSIS — R748 Abnormal levels of other serum enzymes: Secondary | ICD-10-CM | POA: Diagnosis not present

## 2023-03-25 DIAGNOSIS — I10 Essential (primary) hypertension: Secondary | ICD-10-CM | POA: Insufficient documentation

## 2023-03-25 LAB — CBC WITH DIFFERENTIAL/PLATELET
Abs Immature Granulocytes: 0.04 10*3/uL (ref 0.00–0.07)
Basophils Absolute: 0.1 10*3/uL (ref 0.0–0.1)
Basophils Relative: 1 %
Eosinophils Absolute: 0.2 10*3/uL (ref 0.0–0.5)
Eosinophils Relative: 2 %
HCT: 43 % (ref 36.0–46.0)
Hemoglobin: 14 g/dL (ref 12.0–15.0)
Immature Granulocytes: 0 %
Lymphocytes Relative: 25 %
Lymphs Abs: 2.4 10*3/uL (ref 0.7–4.0)
MCH: 29.3 pg (ref 26.0–34.0)
MCHC: 32.6 g/dL (ref 30.0–36.0)
MCV: 90 fL (ref 80.0–100.0)
Monocytes Absolute: 0.5 10*3/uL (ref 0.1–1.0)
Monocytes Relative: 5 %
Neutro Abs: 6.4 10*3/uL (ref 1.7–7.7)
Neutrophils Relative %: 67 %
Platelets: 787 10*3/uL — ABNORMAL HIGH (ref 150–400)
RBC: 4.78 MIL/uL (ref 3.87–5.11)
RDW: 15.1 % (ref 11.5–15.5)
WBC: 9.7 10*3/uL (ref 4.0–10.5)
nRBC: 0 % (ref 0.0–0.2)

## 2023-03-25 LAB — COMPREHENSIVE METABOLIC PANEL
ALT: 29 U/L (ref 0–44)
AST: 28 U/L (ref 15–41)
Albumin: 4.4 g/dL (ref 3.5–5.0)
Alkaline Phosphatase: 96 U/L (ref 38–126)
Anion gap: 15 (ref 5–15)
BUN: 18 mg/dL (ref 6–20)
CO2: 21 mmol/L — ABNORMAL LOW (ref 22–32)
Calcium: 9.7 mg/dL (ref 8.9–10.3)
Chloride: 101 mmol/L (ref 98–111)
Creatinine, Ser: 0.78 mg/dL (ref 0.44–1.00)
GFR, Estimated: >60 mL/min (ref >60)
Glucose, Bld: 86 mg/dL (ref 70–99)
Potassium: 3.8 mmol/L (ref 3.5–5.1)
Sodium: 137 mmol/L (ref 135–145)
Total Bilirubin: 0.3 mg/dL (ref <1.2)
Total Protein: 8.5 g/dL — ABNORMAL HIGH (ref 6.5–8.1)

## 2023-03-25 LAB — LIPASE, BLOOD: Lipase: 33 U/L (ref 11–51)

## 2023-03-25 LAB — D-DIMER, QUANTITATIVE: D-Dimer, Quant: 0.51 ug{FEU}/mL — ABNORMAL HIGH (ref 0.00–0.50)

## 2023-03-25 LAB — CK: Total CK: 337 U/L — ABNORMAL HIGH (ref 38–234)

## 2023-03-25 LAB — MAGNESIUM: Magnesium: 2.1 mg/dL (ref 1.7–2.4)

## 2023-03-25 MED ORDER — SODIUM CHLORIDE 0.9 % IV BOLUS: 1000.0000 mL | Freq: Once | INTRAVENOUS | Status: DC

## 2023-03-25 MED ORDER — OXYCODONE HCL 5 MG PO TABS
5.0000 mg | ORAL_TABLET | Freq: Once | ORAL | Status: AC
Start: 2023-03-25 — End: 2023-03-25
  Administered 2023-03-25: 5 mg via ORAL
  Filled 2023-03-25: qty 1

## 2023-03-25 MED ORDER — AMLODIPINE BESYLATE 5 MG PO TABS
5.0000 mg | ORAL_TABLET | Freq: Every day | ORAL | Status: DC
  Administered 2023-03-25 – 2023-03-26 (×2): 5 mg via ORAL
  Filled 2023-03-25 (×2): qty 1

## 2023-03-25 MED ORDER — QUETIAPINE FUMARATE 25 MG PO TABS
50.0000 mg | ORAL_TABLET | Freq: Every day | ORAL | Status: DC
  Administered 2023-03-25: 50 mg via ORAL
  Filled 2023-03-25: qty 2

## 2023-03-25 MED ORDER — PANTOPRAZOLE SODIUM 40 MG PO TBEC
40.0000 mg | DELAYED_RELEASE_TABLET | Freq: Every day | ORAL | Status: DC
Start: 2023-03-25 — End: 2023-03-26
  Administered 2023-03-25 – 2023-03-26 (×2): 40 mg via ORAL
  Filled 2023-03-25 (×2): qty 1

## 2023-03-25 MED ORDER — DULOXETINE HCL 30 MG PO CPEP
90.0000 mg | ORAL_CAPSULE | Freq: Every day | ORAL | Status: DC
  Administered 2023-03-25 – 2023-03-26 (×2): 90 mg via ORAL
  Filled 2023-03-25 (×2): qty 3

## 2023-03-25 MED ORDER — FLUTICASONE FUROATE-VILANTEROL 200-25 MCG/ACT IN AEPB
1.0000 | INHALATION_SPRAY | Freq: Every day | RESPIRATORY_TRACT | Status: DC
  Administered 2023-03-25 – 2023-03-26 (×2): 1 via RESPIRATORY_TRACT
  Filled 2023-03-25: qty 28

## 2023-03-25 MED ORDER — GABAPENTIN 300 MG PO CAPS
300.0000 mg | ORAL_CAPSULE | Freq: Every day | ORAL | Status: DC
  Administered 2023-03-25 – 2023-03-26 (×2): 300 mg via ORAL
  Filled 2023-03-25 (×2): qty 1

## 2023-03-25 NOTE — ED Notes (Signed)
Pt reporting diarrhea. No nausea or vomiting. Abdominal pain report in upper center.

## 2023-03-25 NOTE — ED Notes (Signed)
Pt dressed out in purple scrubs.  

## 2023-03-25 NOTE — BH Assessment (Signed)
TTS attempted to complete assessment. Patient continued to fall asleep after NT tried multiple times to keep patient awake. Maci, RN, informed of attempt.

## 2023-03-25 NOTE — ED Notes (Signed)
Pt belongings put in locker #12.

## 2023-03-25 NOTE — ED Notes (Signed)
Extremely difficult IV access. Unable to visualize adequate sites and unable to find adequate vasculature with ultrasound. MD informed and PO options discussed.

## 2023-03-25 NOTE — ED Provider Notes (Signed)
[place] EMERGENCY DEPARTMENT AT [facility] Provider Note   CSN: [id number] Arrival date & time: 03/25/23  1204     History  Chief Complaint  Patient presents with   Foot Pain    Kathryn [name] is a 52 y.o. female.  Pt is a 52 yo female with pmhx significant for htn, depression, polysubstance abuse, and copd.  Pt presents to the ED today with diarrhea and bilateral leg pain.  She was admitted from 11/16 to 11/25 to  psych for depression and cocaine abuse.  She said she has not used since getting home.  Leg pain has been going on since last night.  Diarrhea started this am.  Pt's granddaughter also has diarrhea.  No fevers.         Home Medications Prior to Admission medications   Medication Sig Start Date End Date Taking? Authorizing Provider  amLODipine (NORVASC) 5 MG tablet Take 1 tablet (5 mg total) by mouth daily for 10 days. 03/08/23 03/18/23  Charm Rings, NP  DULoxetine (CYMBALTA) 30 MG capsule Take 3 capsules (90 mg total) by mouth daily for 10 days. 03/09/23 03/19/23  Charm Rings, NP  fluticasone furoate-vilanterol (BREO ELLIPTA) 200-25 MCG/ACT AEPB Inhale 1 puff into the lungs daily. 01/17/23   Benjiman Core, MD  gabapentin (NEURONTIN) 300 MG capsule Take 1 capsule (300 mg total) by mouth daily for 10 days. 03/08/23 03/18/23  Charm Rings, NP  omeprazole (PRILOSEC) 20 MG capsule Take 20 mg by mouth daily before breakfast. 02/07/23   [provider]  QUEtiapine (SEROQUEL) 50 MG tablet Take 1 tablet (50 mg total) by mouth at bedtime for 10 days. 03/08/23 03/18/23  Charm Rings, NP      Allergies    Fish allergy, Flexeril [cyclobenzaprine hcl], Ibuprofen, Shellfish allergy, Tylenol [acetaminophen], Tramadol, Ace inhibitors, Peanut allergen powder-dnfp, and Trazodone and nefazodone    Review of Systems   Review of Systems  Gastrointestinal:  Positive for diarrhea.  Musculoskeletal:        Bilateral leg pain  All other systems  reviewed and are negative.   Physical Exam Updated Vital Signs BP 106/74 (BP Location: Left Arm)   Pulse 90   Temp 98.4 F (36.9 C) (Oral)   Resp 16   Ht 4\' 11"  (1.499 m)   Wt 61.7 kg   LMP 06/07/2020 Comment: CT showed evidence of uterus and cervix  SpO2 96%   BMI 27.47 kg/m  Physical Exam Vitals and nursing note reviewed.  Constitutional:      Appearance: Normal appearance.  HENT:     Head: Normocephalic and atraumatic.     Right Ear: External ear normal.     Left Ear: External ear normal.     Nose: Nose normal.     Mouth/Throat:     Mouth: Mucous membranes are moist.     Pharynx: Oropharynx is clear.  Eyes:     Extraocular Movements: Extraocular movements intact.     Conjunctiva/sclera: Conjunctivae normal.     Pupils: Pupils are equal, round, and reactive to light.  Cardiovascular:     Rate and Rhythm: Normal rate and regular rhythm.     Pulses: Normal pulses.     Heart sounds: Normal heart sounds.  Pulmonary:     Effort: Pulmonary effort is normal.     Breath sounds: Normal breath sounds.  Abdominal:     General: Abdomen is flat. Bowel sounds are normal.     Palpations: Abdomen is  soft.  Musculoskeletal:        General: Normal range of motion.     Cervical back: Normal range of motion and neck supple.     Comments: Tenderness to palpation both legs.   Skin:    General: Skin is warm.     Capillary Refill: Capillary refill takes less than 2 seconds.  Neurological:     General: No focal deficit present.     Mental Status: She is alert and oriented to person, place, and time.  Psychiatric:        Mood and Affect: Mood normal.        Behavior: Behavior normal.        Thought Content: Thought content normal.        Judgment: Judgment normal.     ED Results / Procedures / Treatments   Labs (all labs ordered are listed, but only abnormal results are displayed) Labs Reviewed  CBC WITH DIFFERENTIAL/PLATELET - Abnormal; Notable for the following components:       Result Value   Platelets 787 (*)    All other components within normal limits  COMPREHENSIVE METABOLIC PANEL - Abnormal; Notable for the following components:   CO2 21 (*)    Total Protein 8.5 (*)    All other components within normal limits  CK - Abnormal; Notable for the following components:   Total CK 337 (*)    All other components within normal limits  D-DIMER, QUANTITATIVE - Abnormal; Notable for the following components:   D-Dimer, Quant 0.51 (*)    All other components within normal limits  GASTROINTESTINAL PANEL BY PCR, STOOL (REPLACES STOOL CULTURE)  C DIFFICILE QUICK SCREEN W PCR REFLEX    LIPASE, BLOOD  MAGNESIUM  URINALYSIS, ROUTINE W REFLEX MICROSCOPIC  RAPID URINE DRUG SCREEN, HOSP PERFORMED    EKG None  Radiology No results found.  Procedures Procedures    Medications Ordered in ED Medications  sodium chloride 0.9 % bolus 1,000 mL (has no administration in time range)  amLODipine (NORVASC) tablet 5 mg (has no administration in time range)  DULoxetine (CYMBALTA) DR capsule 90 mg (has no administration in time range)  fluticasone furoate-vilanterol (BREO ELLIPTA) 200-25 MCG/ACT 1 puff (has no administration in time range)  gabapentin (NEURONTIN) capsule 300 mg (has no administration in time range)  pantoprazole (PROTONIX) EC tablet 40 mg (has no administration in time range)  QUEtiapine (SEROQUEL) tablet 50 mg (has no administration in time range)  oxyCODONE (Oxy IR/ROXICODONE) immediate release tablet 5 mg (5 mg Oral Given 03/25/23 1518)    ED Course/ Medical Decision Making/ A&P                                 Medical Decision Making Amount and/or Complexity of Data Reviewed Labs: ordered.  Risk Prescription drug management.   This patient presents to the ED for concern of leg pain and diarrhea, this involves an extensive number of treatment options, and is a complaint that carries with it a high risk of complications and morbidity.  The  differential diagnosis includes dvt, gout, neuropathy   Co morbidities that complicate the patient evaluation  htn, depression, polysubstance abuse, and copd   Additional history obtained:  Additional history obtained from epic chart review  Lab Tests:  I Ordered, and personally interpreted labs.  The pertinent results include:  cbc nl, cmp nl, lip nl, mg nl, ck sl elevated at 337;  ddimer is age adjusted nl.   Cardiac Monitoring:  The patient was maintained on a cardiac monitor.  I personally viewed and interpreted the cardiac monitored which showed an underlying rhythm of: nsr   Medicines ordered and prescription drug management:  I ordered medication including oxy ir  for pain  Reevaluation of the patient after these medicines showed that the patient improved I have reviewed the patients home medicines and have made adjustments as needed   Consultations Obtained:  I requested consultation with TTS,  and discussed lab and imaging findings as well as pertinent plan - consult pending   Problem List / ED Course:  Diarrhea:  improved.  Pt now tolerating po fluids.  K and mg nl.  Pt is medically clear. BLE pain:  ddimer is age adjusted nl.  Pt has a hx of neuropathy, so it is likely just that. Depression:  pt told the nurse she was feeling suicidal and did not think she was safe at home.  I spoke with her and she said she has continued to be very depressed after d/c from the psych facility.  She wants to see TTS again.   Reevaluation:  After the interventions noted above, I reevaluated the patient and found that they have :improved   Social Determinants of Health:  Lives at home   Dispostion:  Pending TTS consult        Final Clinical Impression(s) / ED Diagnoses Final diagnoses:  Diarrhea, unspecified type  Neuropathy  Depression, unspecified depression type  Suicidal ideation    Rx / DC Orders ED Discharge Orders     None         Jacalyn Lefevre, MD 03/25/23 1542

## 2023-03-25 NOTE — ED Triage Notes (Signed)
Pt complains of B/L foot and leg pain Hx of neuropathy and GOUT Pain started last night   Numbness in heels Burning and tingling in legs.  Pain 10/10 Last used cocaine 2 weeks ago, denies any other drug use

## 2023-03-26 ENCOUNTER — Encounter (HOSPITAL_COMMUNITY): Payer: Self-pay | Admitting: Family

## 2023-03-26 ENCOUNTER — Inpatient Hospital Stay (HOSPITAL_COMMUNITY)
Admission: AD | Admit: 2023-03-26 | Discharge: 2023-03-30 | DRG: 885 | Disposition: A | Discharge status: Home / Self Care | Payer: Medicare HMO | Source: Intra-hospital | Attending: Psychiatry | Admitting: Psychiatry

## 2023-03-26 ENCOUNTER — Other Ambulatory Visit: Payer: Self-pay

## 2023-03-26 DIAGNOSIS — M4316 Spondylolisthesis, lumbar region: Secondary | ICD-10-CM | POA: Diagnosis present

## 2023-03-26 DIAGNOSIS — Z91411 Personal history of adult psychological abuse: Secondary | ICD-10-CM

## 2023-03-26 DIAGNOSIS — R45851 Suicidal ideations: Secondary | ICD-10-CM | POA: Diagnosis not present

## 2023-03-26 DIAGNOSIS — Z555 Less than a high school diploma: Secondary | ICD-10-CM

## 2023-03-26 DIAGNOSIS — Z91410 Personal history of adult physical and sexual abuse: Secondary | ICD-10-CM

## 2023-03-26 DIAGNOSIS — G4730 Sleep apnea, unspecified: Secondary | ICD-10-CM | POA: Diagnosis present

## 2023-03-26 DIAGNOSIS — G629 Polyneuropathy, unspecified: Secondary | ICD-10-CM | POA: Diagnosis not present

## 2023-03-26 DIAGNOSIS — F32A Depression, unspecified: Secondary | ICD-10-CM | POA: Diagnosis not present

## 2023-03-26 DIAGNOSIS — Z79899 Other long term (current) drug therapy: Secondary | ICD-10-CM

## 2023-03-26 DIAGNOSIS — M1990 Unspecified osteoarthritis, unspecified site: Secondary | ICD-10-CM | POA: Diagnosis present

## 2023-03-26 DIAGNOSIS — R197 Diarrhea, unspecified: Secondary | ICD-10-CM | POA: Diagnosis not present

## 2023-03-26 DIAGNOSIS — Z9151 Personal history of suicidal behavior: Secondary | ICD-10-CM | POA: Diagnosis not present

## 2023-03-26 DIAGNOSIS — Z560 Unemployment, unspecified: Secondary | ICD-10-CM

## 2023-03-26 DIAGNOSIS — Z825 Family history of asthma and other chronic lower respiratory diseases: Secondary | ICD-10-CM | POA: Diagnosis not present

## 2023-03-26 DIAGNOSIS — F333 Major depressive disorder, recurrent, severe with psychotic symptoms: Principal | ICD-10-CM | POA: Diagnosis present

## 2023-03-26 DIAGNOSIS — F19959 Other psychoactive substance use, unspecified with psychoactive substance-induced psychotic disorder, unspecified: Secondary | ICD-10-CM | POA: Diagnosis present

## 2023-03-26 DIAGNOSIS — Z86711 Personal history of pulmonary embolism: Secondary | ICD-10-CM

## 2023-03-26 DIAGNOSIS — F411 Generalized anxiety disorder: Secondary | ICD-10-CM | POA: Diagnosis not present

## 2023-03-26 DIAGNOSIS — Z653 Problems related to other legal circumstances: Secondary | ICD-10-CM

## 2023-03-26 DIAGNOSIS — Z9152 Personal history of nonsuicidal self-harm: Secondary | ICD-10-CM

## 2023-03-26 DIAGNOSIS — I10 Essential (primary) hypertension: Secondary | ICD-10-CM | POA: Diagnosis present

## 2023-03-26 DIAGNOSIS — Z818 Family history of other mental and behavioral disorders: Secondary | ICD-10-CM | POA: Diagnosis not present

## 2023-03-26 DIAGNOSIS — Z5900 Homelessness unspecified: Secondary | ICD-10-CM

## 2023-03-26 DIAGNOSIS — M79604 Pain in right leg: Secondary | ICD-10-CM | POA: Diagnosis not present

## 2023-03-26 DIAGNOSIS — Z7951 Long term (current) use of inhaled steroids: Secondary | ICD-10-CM

## 2023-03-26 DIAGNOSIS — F1410 Cocaine abuse, uncomplicated: Secondary | ICD-10-CM | POA: Diagnosis present

## 2023-03-26 DIAGNOSIS — F322 Major depressive disorder, single episode, severe without psychotic features: Secondary | ICD-10-CM | POA: Diagnosis present

## 2023-03-26 DIAGNOSIS — K219 Gastro-esophageal reflux disease without esophagitis: Secondary | ICD-10-CM | POA: Diagnosis present

## 2023-03-26 DIAGNOSIS — G8929 Other chronic pain: Secondary | ICD-10-CM | POA: Diagnosis present

## 2023-03-26 DIAGNOSIS — Z5941 Food insecurity: Secondary | ICD-10-CM | POA: Diagnosis not present

## 2023-03-26 DIAGNOSIS — R4587 Impulsiveness: Secondary | ICD-10-CM | POA: Diagnosis present

## 2023-03-26 DIAGNOSIS — Z87891 Personal history of nicotine dependence: Secondary | ICD-10-CM

## 2023-03-26 DIAGNOSIS — Z5982 Transportation insecurity: Secondary | ICD-10-CM

## 2023-03-26 DIAGNOSIS — J4489 Other specified chronic obstructive pulmonary disease: Secondary | ICD-10-CM | POA: Diagnosis not present

## 2023-03-26 DIAGNOSIS — M549 Dorsalgia, unspecified: Secondary | ICD-10-CM | POA: Diagnosis present

## 2023-03-26 DIAGNOSIS — F332 Major depressive disorder, recurrent severe without psychotic features: Principal | ICD-10-CM | POA: Diagnosis present

## 2023-03-26 DIAGNOSIS — Z23 Encounter for immunization: Secondary | ICD-10-CM | POA: Diagnosis not present

## 2023-03-26 DIAGNOSIS — Z8249 Family history of ischemic heart disease and other diseases of the circulatory system: Secondary | ICD-10-CM

## 2023-03-26 DIAGNOSIS — J449 Chronic obstructive pulmonary disease, unspecified: Secondary | ICD-10-CM | POA: Diagnosis not present

## 2023-03-26 DIAGNOSIS — Z91010 Allergy to peanuts: Secondary | ICD-10-CM | POA: Diagnosis not present

## 2023-03-26 DIAGNOSIS — F1910 Other psychoactive substance abuse, uncomplicated: Secondary | ICD-10-CM | POA: Diagnosis present

## 2023-03-26 DIAGNOSIS — M79605 Pain in left leg: Secondary | ICD-10-CM | POA: Diagnosis not present

## 2023-03-26 MED ORDER — MAGNESIUM HYDROXIDE 400 MG/5ML PO SUSP: 30.0000 mL | Freq: Every day | ORAL | Status: DC | PRN

## 2023-03-26 MED ORDER — HALOPERIDOL 5 MG PO TABS: 5.0000 mg | ORAL_TABLET | Freq: Three times a day (TID) | ORAL | Status: DC | PRN

## 2023-03-26 MED ORDER — PANTOPRAZOLE SODIUM 40 MG PO TBEC
40.0000 mg | DELAYED_RELEASE_TABLET | Freq: Every day | ORAL | Status: DC
  Administered 2023-03-27 – 2023-03-30 (×4): 40 mg via ORAL
  Filled 2023-03-26 (×6): qty 1

## 2023-03-26 MED ORDER — GABAPENTIN 300 MG PO CAPS
300.0000 mg | ORAL_CAPSULE | Freq: Every day | ORAL | Status: DC
  Administered 2023-03-27 – 2023-03-28 (×2): 300 mg via ORAL
  Filled 2023-03-26 (×4): qty 1

## 2023-03-26 MED ORDER — AMLODIPINE BESYLATE 5 MG PO TABS
5.0000 mg | ORAL_TABLET | Freq: Every day | ORAL | Status: DC
  Administered 2023-03-27 – 2023-03-30 (×4): 5 mg via ORAL
  Filled 2023-03-26 (×6): qty 1

## 2023-03-26 MED ORDER — INFLUENZA VIRUS VACC SPLIT PF (FLUZONE) 0.5 ML IM SUSY
0.5000 mL | PREFILLED_SYRINGE | INTRAMUSCULAR | Status: AC
  Administered 2023-03-27: 0.5 mL via INTRAMUSCULAR
  Filled 2023-03-26: qty 0.5

## 2023-03-26 MED ORDER — NICOTINE 14 MG/24HR TD PT24
14.0000 mg | MEDICATED_PATCH | Freq: Every day | TRANSDERMAL | Status: DC
Start: 2023-03-26 — End: 2023-03-30
  Administered 2023-03-27 – 2023-03-29 (×3): 14 mg via TRANSDERMAL
  Filled 2023-03-26 (×7): qty 1

## 2023-03-26 MED ORDER — FLUTICASONE FUROATE-VILANTEROL 200-25 MCG/ACT IN AEPB
1.0000 | INHALATION_SPRAY | Freq: Every day | RESPIRATORY_TRACT | Status: DC
  Administered 2023-03-27 – 2023-03-30 (×4): 1 via RESPIRATORY_TRACT
  Filled 2023-03-26: qty 28

## 2023-03-26 MED ORDER — QUETIAPINE FUMARATE 50 MG PO TABS
50.0000 mg | ORAL_TABLET | Freq: Every day | ORAL | Status: DC
  Filled 2023-03-26 (×4): qty 1

## 2023-03-26 MED ORDER — QUETIAPINE FUMARATE 50 MG PO TABS
50.0000 mg | ORAL_TABLET | Freq: Every day | ORAL | Status: DC
  Administered 2023-03-26: 50 mg via ORAL
  Filled 2023-03-26 (×3): qty 1

## 2023-03-26 MED ORDER — DULOXETINE HCL 60 MG PO CPEP
90.0000 mg | ORAL_CAPSULE | Freq: Every day | ORAL | Status: DC
  Administered 2023-03-27 – 2023-03-30 (×4): 90 mg via ORAL
  Filled 2023-03-26 (×6): qty 1

## 2023-03-26 MED ORDER — GABAPENTIN 300 MG PO CAPS
300.0000 mg | ORAL_CAPSULE | Freq: Once | ORAL | Status: AC
Start: 2023-03-26 — End: 2023-03-26
  Administered 2023-03-26: 300 mg via ORAL
  Filled 2023-03-26 (×2): qty 1

## 2023-03-26 MED ORDER — DIPHENHYDRAMINE HCL 25 MG PO CAPS: 50.0000 mg | ORAL_CAPSULE | Freq: Three times a day (TID) | ORAL | Status: DC | PRN

## 2023-03-26 MED ORDER — ALUM & MAG HYDROXIDE-SIMETH 200-200-20 MG/5ML PO SUSP: 30.0000 mL | ORAL | Status: DC | PRN

## 2023-03-26 NOTE — Plan of Care (Signed)
Nurse discussed anxiety, depression and coping skills with patient.  

## 2023-03-26 NOTE — [facility] Group Notes (Signed)
Psychoeducational Group Note  Date:  03/26/2023 Time:  2000  Group Topic/Focus: Alcoholics Anonymous Meeting  Participation Level: Did Not Attend  Participation Quality:  Not Applicable  Affect:  Not Applicable  Cognitive:  Not Applicable  Insight:  Not Applicable  Engagement in Group: Not Applicable  Additional Comments:  Did not attend.   Marcille Buffy 03/26/2023, 9:36 PM

## 2023-03-26 NOTE — Progress Notes (Addendum)
Pt admitted voluntarily to [facility] d/t suicidal ideation.  Pt stated her stressor is d/t her being homeless.  Pt has been homeless for ~ 5 years.  Pt continues to endorse suicidal ideation; contracts for safety.  Prior to admission, pt had a plan to cut her wrist.  Pt also endorses [facility].  Pt reports seeing the devil with red eyes.  Pt reports hearing "you're not going to be here long" and " you might as well kill yourself because you aren't going to find anywhere to go.  Pt currently using a walker.  Reports falling on Saturday d/t osteoarthritis of left knee.  Pt re-oriented to unit.  Pt safe on unit.

## 2023-03-26 NOTE — ED Notes (Signed)
TTS was attempted but pt too lethargic to participate in interview. Will reattempt TTS when pt is more alert.

## 2023-03-26 NOTE — ED Notes (Signed)
Pt received breakfast tray 

## 2023-03-26 NOTE — ED Notes (Signed)
Family visiting with pt
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
## 2023-03-26 NOTE — ED Provider Notes (Signed)
Emergency Medicine Observation Re-evaluation Note  Kathryn [name] is a 52 y.o. female, seen on rounds today.  Pt initially presented to the ED for complaints of Foot Pain Currently, the patient is calm and cooperative.  She reports aches in her back, Arms and legsBut has no other systemic complaints at this time  Physical Exam  BP 114/66   Pulse 93   Temp 98.4 F (36.9 C) (Oral)   Resp 16   Ht 4\' 11"  (1.499 m)   Wt 61.7 kg   LMP 06/07/2020 Comment: CT showed evidence of uterus and cervix  SpO2 97%   BMI 27.47 kg/m  Physical Exam General: Awake. Alert. No acute distress Cardiac: Regular rate rhythm Lungs: Clear to auscultation bilaterally Psych: Calm and cooperative ED Course / MDM  EKG:   I have reviewed the labs performed to date as well as medications administered while in observation.  Recent changes in the last 24 hours include awaiting TTS evaluation.  Plan  Current plan is for TTS evaluation., Continue monitoring in the ED and disposition    Royanne Foots, DO 03/26/23 0825

## 2023-03-26 NOTE — Progress Notes (Signed)
Pt given Gabapentin 300 mg for her pain. After an hour, pt states that pain is still the same. Will continue to monitor.

## 2023-03-26 NOTE — ED Notes (Signed)
TTS monitor placed at bedside. BH called reporting they will give patient time to finish eating her breakfast and will call in to complete her TTS in about 10 minutes.

## 2023-03-26 NOTE — Progress Notes (Signed)
Pt has been accepted to [facility] Today 03/26/2023 Bed assignment: 305-2  Pt meets inpatient criteria per: Malachy Chamber NP  Attending Physician will be: Phineas Inches, MD   Report can be called to Adult unit: [phone]  Pt can arrive after pre-admit is complete   Care Team Notified: [facility] Rona Ravens RN, Malachy Chamber NP, Patrecia Pace RN, Netta Corrigan RN      [place] Luken Shadowens LCSW-A   03/26/2023 1:03 PM

## 2023-03-26 NOTE — Progress Notes (Signed)
   03/26/23 2013  Psych Admission Type (Psych Patients Only)  Admission Status Voluntary  Psychosocial Assessment  Patient Complaints Self-harm thoughts;Anxiety;Depression (contracts for safety)  Eye Nature conservation officer  Facial Expression Flat  Affect Appropriate to circumstance  Speech Logical/coherent  Interaction Minimal  Motor Activity Slow (uses walker)  Appearance/Hygiene Unremarkable;In scrubs  Behavior Characteristics Cooperative  Mood Depressed  Thought Process  Coherency WDL  Content WDL  Delusions None reported or observed  Perception WDL  Hallucination None reported or observed  Judgment Impaired  Confusion None  Danger to Self  Current suicidal ideation? Passive  Self-Injurious Behavior No self-injurious ideation or behavior indicators observed or expressed   Agreement Not to Harm Self Yes  Description of Agreement verbal  Danger to Others  Danger to Others None reported or observed   Progress note   D: Pt seen in her room in bed. Pt denies HI, AVH. Pt endorses passive SI but contracts for safety. Pt rates pain 10/10 as generalized chronic pain. Provider notified. Pt endorses anxiety and depression. No other concerns noted at this time.  A: Pt provided support and encouragement. Pt given scheduled medication as prescribed. PRNs as appropriate. Q15 min checks for safety.   R: Pt safe on the unit. Will continue to monitor.

## 2023-03-26 NOTE — ED Notes (Addendum)
Pt ambulated to and from restroom with walker and NT following.

## 2023-03-27 MED ORDER — QUETIAPINE FUMARATE 100 MG PO TABS
100.0000 mg | ORAL_TABLET | Freq: Every day | ORAL | Status: DC
  Administered 2023-03-27 – 2023-03-29 (×3): 100 mg via ORAL
  Filled 2023-03-27 (×6): qty 1

## 2023-03-27 NOTE — Progress Notes (Signed)
   03/27/23 0559  15 Minute Checks  Location Hallway  Visual Appearance Calm  Behavior Composed  Sleep (Behavioral Health Patients Only)  Calculate sleep? (Click Yes once per 24 hr at 0600 safety check) Yes  Documented sleep last 24 hours 5.5
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
## 2023-03-27 NOTE — Group Note (Signed)
Date:  03/27/2023 Time:  4:38 AM  Group Topic/Focus:  Wrap-Up Group:   The focus of this group is to help patients review their daily goal of treatment and discuss progress on daily workbooks.    Participation Level:  Did Not Attend  Participation Quality:   N/A  Affect:   N/A  Cognitive:   N/A  Insight: None  Engagement in Group:   N/A  Modes of Intervention:   N/A  Additional Comments:  Patient did not attend wrap up group.   Kennieth Francois 03/27/2023, 4:38 AM

## 2023-03-27 NOTE — [facility] Group Notes (Signed)
Psychoeducational Group Note  Date:  03/27/2023 Time:  830  Group Topic/Focus:  Wrap-Up Group:   The focus of this group is to help patients review their daily goal of treatment and discuss progress on daily workbooks.  Participation Level: Did Not Attend  Participation Quality:  Not Applicable  Affect:  Not Applicable  Cognitive:  Not Applicable  Insight:  Not Applicable  Engagement in Group: Not Applicable  Additional Comments:  Pt wasn't in wrap-up group due being in her room.  Colon Rueth, Sharen Counter 03/27/2023, 10:24 PM
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
## 2023-03-27 NOTE — Plan of Care (Signed)
  Problem: Education: Goal: Emotional status will improve Outcome: Progressing   Problem: Activity: Goal: Interest or engagement in activities will improve Outcome: Progressing   Problem: Health Behavior/Discharge Planning: Goal: Compliance with treatment plan for underlying cause of condition will improve Outcome: Progressing

## 2023-03-27 NOTE — Plan of Care (Signed)
  Problem: Education: Goal: Emotional status will improve Outcome: Progressing Goal: Mental status will improve Outcome: Progressing   Problem: Activity: Goal: Interest or engagement in activities will improve Outcome: Progressing Goal: Sleeping patterns will improve Outcome: Progressing   Problem: Coping: Goal: Ability to demonstrate self-control will improve Outcome: Progressing   Problem: Physical Regulation: Goal: Ability to maintain clinical measurements within normal limits will improve Outcome: Progressing   Problem: Safety: Goal: Periods of time without injury will increase Outcome: Progressing

## 2023-03-27 NOTE — Group Note (Signed)
Recreation Therapy Group Note   Group Topic:Animal Assisted Therapy   Group Date: 03/27/2023 Start Time: 0950 End Time: 1030 Facilitators: Shakhia Gramajo-McCall, LRT,CTRS Location: [address] Dayroom   Animal-Assisted Activity (AAA) Program Checklist/Progress Notes Patient Eligibility Criteria Checklist & Daily Group note for Rec Tx Intervention  AAA/T Program Assumption of Risk Form signed by Patient/ or Parent Legal Guardian Yes  Patient understands his/her participation is voluntary Yes  Education: Charity fundraiser, Appropriate Animal Interaction   Education Outcome: Acknowledges education.    Affect/Mood: N/A   Participation Level: Did not attend    Clinical Observations/Individualized Feedback:     Plan: Continue to engage patient in RT group sessions 2-3x/week.   Kathryn [name], LRT,CTRS 03/27/2023 12:46 PM

## 2023-03-28 ENCOUNTER — Encounter (HOSPITAL_COMMUNITY): Payer: Self-pay

## 2023-03-28 MED ORDER — GABAPENTIN 300 MG PO CAPS
600.0000 mg | ORAL_CAPSULE | Freq: Every day | ORAL | Status: DC
  Administered 2023-03-29 – 2023-03-30 (×2): 600 mg via ORAL
  Filled 2023-03-28 (×4): qty 2

## 2023-03-28 MED ORDER — GABAPENTIN 100 MG PO CAPS
200.0000 mg | ORAL_CAPSULE | Freq: Once | ORAL | Status: AC
  Administered 2023-03-28: 200 mg via ORAL
  Filled 2023-03-28 (×2): qty 2

## 2023-03-28 NOTE — BH IP Treatment Plan (Signed)
Interdisciplinary Treatment and Diagnostic Plan Update  03/28/2023 Time of Session: 10:40AM Kathryn [name] MRN: [id number]  Principal Diagnosis: <principal problem not specified>  Secondary Diagnoses: Active Problems:   GAD (generalized anxiety disorder)   Polysubstance abuse (HCC)   MDD (major depressive disorder), recurrent episode, severe (HCC)   Current Medications:  Current Facility-Administered Medications  Medication Dose Route Frequency Provider Last Rate Last Admin   alum & mag hydroxide-simeth (MAALOX/MYLANTA) 200-200-20 MG/5ML suspension 30 mL  30 mL Oral Q4H PRN Starkes-Perry, Juel Burrow, FNP       amLODipine (NORVASC) tablet 5 mg  5 mg Oral Daily Maryagnes Amos, FNP   5 mg at 03/28/23 8657   haloperidol (HALDOL) tablet 5 mg  5 mg Oral TID PRN Maryagnes Amos, FNP       And   diphenhydrAMINE (BENADRYL) capsule 50 mg  50 mg Oral TID PRN Maryagnes Amos, FNP       DULoxetine (CYMBALTA) DR capsule 90 mg  90 mg Oral Daily Maryagnes Amos, FNP   90 mg at 03/28/23 0829   fluticasone furoate-vilanterol (BREO ELLIPTA) 200-25 MCG/ACT 1 puff  1 puff Inhalation Daily Maryagnes Amos, FNP   1 puff at 03/28/23 0828   [START ON 03/29/2023] gabapentin (NEURONTIN) capsule 600 mg  600 mg Oral Daily Golda Acre, MD       magnesium hydroxide (MILK OF MAGNESIA) suspension 30 mL  30 mL Oral Daily PRN Starkes-Perry, Juel Burrow, FNP       nicotine (NICODERM CQ - dosed in mg/24 hours) patch 14 mg  14 mg Transdermal Daily Massengill, Harrold Donath, MD   14 mg at 03/28/23 0829   pantoprazole (PROTONIX) EC tablet 40 mg  40 mg Oral Daily Maryagnes Amos, FNP   40 mg at 03/28/23 8469   QUEtiapine (SEROQUEL) tablet 100 mg  100 mg Oral QHS Peterson Ao, MD   100 mg at 03/27/23 2122   PTA Medications: Medications Prior to Admission  Medication Sig Dispense Refill Last Dose   albuterol (VENTOLIN HFA) 108 (90 Base) MCG/ACT inhaler Inhale 2 puffs into the lungs every 6  (six) hours as needed for wheezing or shortness of breath.      baclofen (LIORESAL) 10 MG tablet Take 10 mg by mouth 2 (two) times daily.      pregabalin (LYRICA) 150 MG capsule Take 150 mg by mouth 2 (two) times daily.      tiZANidine (ZANAFLEX) 4 MG capsule Take 4 mg by mouth daily.      amLODipine (NORVASC) 5 MG tablet Take 1 tablet (5 mg total) by mouth daily for 10 days. 10 tablet 0    DULoxetine (CYMBALTA) 30 MG capsule Take 3 capsules (90 mg total) by mouth daily for 10 days. 30 capsule 0    fluticasone furoate-vilanterol (BREO ELLIPTA) 200-25 MCG/ACT AEPB Inhale 1 puff into the lungs daily. 180 each 10    omeprazole (PRILOSEC) 20 MG capsule Take 20 mg by mouth daily before breakfast.      QUEtiapine (SEROQUEL) 50 MG tablet Take 1 tablet (50 mg total) by mouth at bedtime for 10 days. 10 tablet 0     Patient Stressors:    Patient Strengths:    Treatment Modalities: Medication Management, Group therapy, Case management,  1 to 1 session with clinician, Psychoeducation, Recreational therapy.   Physician Treatment Plan for Primary Diagnosis: <principal problem not specified> Long Term Goal(s):     Short Term Goals:    Medication Management:  Evaluate patient's response, side effects, and tolerance of medication regimen.  Therapeutic Interventions: 1 to 1 sessions, Unit Group sessions and Medication administration.  Evaluation of Outcomes: Not Progressing  Physician Treatment Plan for Secondary Diagnosis: Active Problems:   GAD (generalized anxiety disorder)   Polysubstance abuse (HCC)   MDD (major depressive disorder), recurrent episode, severe (HCC)  Long Term Goal(s):     Short Term Goals:       Medication Management: Evaluate patient's response, side effects, and tolerance of medication regimen.  Therapeutic Interventions: 1 to 1 sessions, Unit Group sessions and Medication administration.  Evaluation of Outcomes: Not Progressing   RN Treatment Plan for Primary  Diagnosis: <principal problem not specified> Long Term Goal(s): Knowledge of disease and therapeutic regimen to maintain health will improve  Short Term Goals: Ability to remain free from injury will improve, Ability to verbalize frustration and anger appropriately will improve, Ability to demonstrate self-control, Ability to participate in decision making will improve, Ability to verbalize feelings will improve, Ability to disclose and discuss suicidal ideas, Ability to identify and develop effective coping behaviors will improve, and Compliance with prescribed medications will improve  Medication Management: RN will administer medications as ordered by provider, will assess and evaluate patient's response and provide education to patient for prescribed medication. RN will report any adverse and/or side effects to prescribing provider.  Therapeutic Interventions: 1 on 1 counseling sessions, Psychoeducation, Medication administration, Evaluate responses to treatment, Monitor vital signs and CBGs as ordered, Perform/monitor CIWA, COWS, AIMS and Fall Risk screenings as ordered, Perform wound care treatments as ordered.  Evaluation of Outcomes: Not Progressing   LCSW Treatment Plan for Primary Diagnosis: <principal problem not specified> Long Term Goal(s): Safe transition to appropriate next level of care at discharge, Engage patient in therapeutic group addressing interpersonal concerns.  Short Term Goals: Engage patient in aftercare planning with referrals and resources, Increase social support, Increase ability to appropriately verbalize feelings, Increase emotional regulation, Facilitate acceptance of mental health diagnosis and concerns, Facilitate patient progression through stages of change regarding substance use diagnoses and concerns, Identify triggers associated with mental health/substance abuse issues, and Increase skills for wellness and recovery  Therapeutic Interventions: Assess for all  discharge needs, 1 to 1 time with Social worker, Explore available resources and support systems, Assess for adequacy in community support network, Educate family and significant other(s) on suicide prevention, Complete Psychosocial Assessment, Interpersonal group therapy.  Evaluation of Outcomes: Not Progressing   Progress in Treatment: Attending groups: No. Participating in groups: No. Taking medication as prescribed: Yes. Toleration medication: Yes. Family/Significant other contact made: No, will contact:  consents pending Patient understands diagnosis: Yes. Discussing patient identified problems/goals with staff: Yes. Medical problems stabilized or resolved: Yes. Denies suicidal/homicidal ideation: Yes. Issues/concerns per patient self-inventory: No.  New problem(s) identified: No, Describe:  none reported  New Short Term/Long Term Goal(s): medication stabilization, elimination of SI thoughts, development of comprehensive mental wellness plan.    Patient Goals:  "I need a place to live and to adjust my medications"  Discharge Plan or Barriers: Patient recently admitted. CSW will continue to follow and assess for appropriate referrals and possible discharge planning.    Reason for Continuation of Hospitalization: Depression Medication stabilization Suicidal ideation  Estimated Length of Stay: 2-4 days  Last 3 [place] Suicide Severity Risk Score: Flowsheet Row [facility] from 03/25/2023 in [facility] Admission (Discharged) from 03/03/2023 in  [facility]  C-SSRS RISK CATEGORY High Risk High Risk High Risk       Last PHQ 2/9 Scores:    02/02/2022    9:20 AM 12/20/2021   11:26 AM 11/03/2021    9:10 AM  Depression screen PHQ 2/9  Decreased Interest 0 3 0  Down, Depressed, Hopeless 0 3 0  PHQ - 2 Score 0 6 0  Altered sleeping 0 3    Tired, decreased energy 0 3   Change in appetite 0 3   Feeling bad or failure about yourself  0 3   Trouble concentrating 0 3   Moving slowly or fidgety/restless 0 0   Suicidal thoughts 0 3   PHQ-9 Score 0 24   Difficult doing work/chores Not difficult at all Very difficult     Scribe for Treatment Team: Kathi Der, LCSWA 03/28/2023 1:33 PM

## 2023-03-28 NOTE — Group Note (Signed)
Date:  03/28/2023 Time:  10:23 AM  Group Topic/Focus:  Goals Group:   The focus of this group is to help patients establish daily goals to achieve during treatment and discuss how the patient can incorporate goal setting into their daily lives to aide in recovery. Orientation:   The focus of this group is to educate the patient on the purpose and policies of crisis stabilization and provide a format to answer questions about their admission.  The group details unit policies and expectations of patients while admitted.    Participation Level:  Did Not Attend  Participation Quality:   n/a  Affect:   n/a  Cognitive:   n/a  Insight: None  Engagement in Group:   n/a  Modes of Intervention:   n/a  Additional Comments:   Pt did not attend.  Edmund Hilda Jleigh Striplin 03/28/2023, 10:23 AM

## 2023-03-28 NOTE — Plan of Care (Signed)
  Problem: Education: Goal: Emotional status will improve Outcome: Progressing Goal: Verbalization of understanding the information provided will improve Outcome: Progressing   Problem: Activity: Goal: Sleeping patterns will improve Outcome: Progressing   Problem: Coping: Goal: Ability to demonstrate self-control will improve Outcome: Progressing   Problem: Health Behavior/Discharge Planning: Goal: Compliance with treatment plan for underlying cause of condition will improve Outcome: Progressing

## 2023-03-28 NOTE — Progress Notes (Signed)
   03/27/23 2146  Psych Admission Type (Psych Patients Only)  Admission Status Voluntary  Psychosocial Assessment  Patient Complaints Anxiety;Depression  Eye Contact Fair  Facial Expression Animated  Affect Appropriate to circumstance  Speech Logical/coherent  Interaction Assertive  Motor Activity Slow  Appearance/Hygiene Unremarkable  Behavior Characteristics Cooperative;Appropriate to situation  Mood Depressed;Anxious  Thought Process  Coherency WDL  Content WDL  Delusions None reported or observed  Perception WDL  Hallucination None reported or observed  Judgment WDL  Confusion None  Danger to Self  Current suicidal ideation? Denies  Agreement Not to Harm Self Yes  Description of Agreement verbal  Danger to Others  Danger to Others None reported or observed  Danger to Others Abnormal  Harmful Behavior to others No threats or harm toward other people  Destructive Behavior No threats or harm toward property

## 2023-03-28 NOTE — Group Note (Signed)
Recreation Therapy Group Note   Group Topic:Team Building  Group Date: 03/28/2023 Start Time: 0930 End Time: 1000 Facilitators: Evalin Shawhan-McCall, LRT,CTRS Location: [address] Dayroom   Group Topic: Communication, Team Building, Problem Solving  Goal Area(s) Addresses:  Patient will effectively work with peer towards shared goal.  Patient will identify skills used to make activity successful.  Patient will identify how skills used during activity can be used to reach post d/c goals.   Intervention: STEM Activity  Group Description: Stage manager. In teams of 3-5, patients were given 12 plastic drinking straws and an equal length of masking tape. Using the materials provided, patients were asked to build a landing pad to catch a golf ball dropped from approximately 5 feet in the air. All materials were required to be used by the team in their design. LRT facilitated post-activity discussion.  Education: Pharmacist, community, Scientist, physiological, Discharge Planning   Education Outcome: Acknowledges education/In group clarification offered/Needs additional education.    Affect/Mood: N/A   Participation Level: Did not attend    Clinical Observations/Individualized Feedback:      Plan: Continue to engage patient in RT group sessions 2-3x/week.   Shatima Zalar-McCall, LRT,CTRS  03/28/2023 1:25 PM

## 2023-03-28 NOTE — Group Note (Signed)
LCSW Group Therapy Note   Group Date: 03/27/2023 Start Time: 1100 End Time: 1200   .[facility] LCSW Group Therapy Note  Date/Time:    Type of Therapy and Topic:  Group Therapy:  Healthy and Unhealthy Supports  Participation Level:  Active   Description of Group:  Patients in this group were introduced to the idea of adding a variety of healthy supports to address the various needs in their lives, especially in reference to their plans and focus for the new year.  Patients discussed what additional healthy supports could be helpful in their recovery and wellness after discharge in order to prevent future hospitalizations.   An emphasis was placed on using counselor, doctor, therapy groups, 12-step groups, and problem-specific support groups to expand supports.    Therapeutic Goals:   1)  discuss importance of adding supports to stay well once out of the hospital  2)  compare healthy versus unhealthy supports and identify some examples of each  3)  generate ideas and descriptions of healthy supports that can be added  4)  offer mutual support about how to address unhealthy supports  5)  encourage active participation in and adherence to discharge plan    Summary of Patient Progress:  The patient stated that she doesn't have any healthy supports in her life.  The patient expressed a willingness to add self care and claiming positivity into her life, God is in control as support(s) to help in her recovery journey.   Therapeutic Modalities:   Motivational Interviewing Brief Solution-Focused Therapy  Steffanie Dunn, Theresia Majors 03/28/2023  9:34 AM

## 2023-03-28 NOTE — Progress Notes (Signed)
   03/28/23 1200  Psych Admission Type (Psych Patients Only)  Admission Status Voluntary  Psychosocial Assessment  Patient Complaints Anxiety;Depression  Eye Contact Fair  Facial Expression Animated  Affect Preoccupied  Speech Logical/coherent  Interaction Assertive  Motor Activity Slow  Appearance/Hygiene Unremarkable  Behavior Characteristics Cooperative  Mood Preoccupied  Thought Process  Coherency Circumstantial  Content WDL  Delusions None reported or observed  Perception WDL  Hallucination None reported or observed  Judgment WDL  Confusion None  Danger to Self  Current suicidal ideation? Denies  Agreement Not to Harm Self Yes  Description of Agreement verbal  Danger to Others  Danger to Others None reported or observed  Danger to Others Abnormal  Harmful Behavior to others No threats or harm toward other people  Destructive Behavior No threats or harm toward property
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
## 2023-03-28 NOTE — [facility] Group Notes (Signed)

## 2023-03-29 MED ORDER — LIDOCAINE 5 % EX PTCH
1.0000 | MEDICATED_PATCH | CUTANEOUS | Status: DC
  Administered 2023-03-29 – 2023-03-30 (×2): 1 via TRANSDERMAL
  Filled 2023-03-29 (×5): qty 1

## 2023-03-29 NOTE — Group Note (Signed)
LCSW Group Therapy Note  Group Date: 03/29/2023 Start Time: 1100 End Time: 1200   Type of Therapy and Topic:  Group Therapy - Healthy vs Unhealthy Coping Skills  Participation Level:  Active   Description of Group The focus of this group was to determine what unhealthy coping techniques typically are used by group members and what healthy coping techniques would be helpful in coping with various problems. Patients were guided in becoming aware of the differences between healthy and unhealthy coping techniques. Patients were asked to identify 2-3 healthy coping skills they would like to learn to use more effectively.  Therapeutic Goals Patients learned that coping is what human beings do all day long to deal with various situations in their lives Patients defined and discussed healthy vs unhealthy coping techniques Patients identified their preferred coping techniques and identified whether these were healthy or unhealthy Patients determined 2-3 healthy coping skills they would like to become more familiar with and use more often. Patients provided support and ideas to each other   Summary of Patient Progress:  Patient proved open to input from peers and feedback from CSW. Patient demonstrated positive insight into the subject matter, was respectful of peers, and participated throughout the entire session.   Therapeutic Modalities Cognitive Behavioral Therapy Motivational Interviewing  Kathi Der, LCSWA 03/29/2023  3:06 PM

## 2023-03-29 NOTE — Group Note (Signed)
Date:  03/29/2023 Time:  5:16 AM  Group Topic/Focus:  Wrap-Up Group:   The focus of this group is to help patients review their daily goal of treatment and discuss progress on daily workbooks.    Participation Level:  Did Not Attend  Participation Quality:   n/a  Affect:   n/a  Cognitive:   n/a  Insight: None  Engagement in Group:   n/a  Modes of Intervention:   n/a  Additional Comments:  Patient did not attend wrap up group.   Kennieth Francois 03/29/2023, 5:16 AM
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
## 2023-03-29 NOTE — Plan of Care (Signed)
  Problem: Education: Goal: Emotional status will improve Outcome: Progressing Goal: Mental status will improve Outcome: Progressing   Problem: Activity: Goal: Interest or engagement in activities will improve Outcome: Progressing Goal: Sleeping patterns will improve Outcome: Progressing

## 2023-03-29 NOTE — [facility] Group Notes (Signed)
[facility] Group Notes:  (Nursing/MHT/Case Management/Adjunct)  Adult Psychoeducational Group Note  Date:  03/29/2023 Time:  10:19 AM  Group Topic/Focus:  Goals Group:   The focus of this group is to help patients establish daily goals to achieve during treatment and discuss how the patient can incorporate goal setting into their daily lives to aide in recovery. Orientation:   The focus of this group is to educate the patient on the purpose and policies of crisis stabilization and provide a format to answer questions about their admission.  The group details unit policies and expectations of patients while admitted.  Participation Level:  Did Not Attend    Additional Comments:  Did not attend  Lucilla Edin 03/29/2023, 10:19 AM

## 2023-03-29 NOTE — Progress Notes (Addendum)
Pt requesting referral for Residential Treatment for substance use disorder. Pt's clinicals faxed to [facility] and pt was denied due to insurance.   Pt clinicals faxed to [facility] in [facility] [phone], awaiting decision.

## 2023-03-29 NOTE — Progress Notes (Addendum)
Pt denied SI/HI/AVH this morning. Pt rated her depression a 10/10. Pt complains of 10/10 pain from neuropathy in her bilateral feet, scheduled gabapentin given per [facility] for pain. Pt preoccupied with going to rehab when she is discharged, social work made aware. Pt has been cooperative throughout the shift. RN provided support and encouragement to patient. Pt given scheduled medications as prescribed. Q15 min checks verified for safety. Patient verbally contracts for safety. Patient compliant with medications and treatment plan. Pt is safe on the unit.   03/29/23 0800  Psych Admission Type (Psych Patients Only)  Admission Status Voluntary  Psychosocial Assessment  Patient Complaints Depression  Eye Contact Brief  Facial Expression Flat;Sad  Affect Depressed;Sad  Speech Soft  Interaction Assertive  Motor Activity Slow  Appearance/Hygiene Unremarkable  Behavior Characteristics Cooperative  Mood Depressed;Preoccupied  Thought Process  Coherency Circumstantial  Content WDL  Delusions None reported or observed  Perception WDL  Hallucination None reported or observed  Judgment WDL  Confusion WDL  Danger to Self  Current suicidal ideation? Denies  Self-Injurious Behavior No self-injurious ideation or behavior indicators observed or expressed   Agreement Not to Harm Self Yes  Description of Agreement Verbal  Danger to Others  Danger to Others None reported or observed  Danger to Others Abnormal  Harmful Behavior to others No threats or harm toward other people  Destructive Behavior No threats or harm toward property

## 2023-03-30 MED ORDER — LIDOCAINE 5 % EX PTCH: 1.0000 | MEDICATED_PATCH | CUTANEOUS | Status: DC

## 2023-03-30 MED ORDER — GABAPENTIN 300 MG PO CAPS: 600.0000 mg | ORAL_CAPSULE | Freq: Every day | ORAL | 0 refills | Status: DC

## 2023-03-30 MED ORDER — DULOXETINE HCL 30 MG PO CPEP: 90.0000 mg | ORAL_CAPSULE | Freq: Every day | ORAL | 0 refills | Status: DC

## 2023-03-30 MED ORDER — QUETIAPINE FUMARATE 100 MG PO TABS: 100.0000 mg | ORAL_TABLET | Freq: Every day | ORAL | 0 refills | Status: DC

## 2023-03-30 MED ORDER — NICOTINE 14 MG/24HR TD PT24: 14.0000 mg | MEDICATED_PATCH | Freq: Every day | TRANSDERMAL | 0 refills | Status: DC

## 2023-03-30 NOTE — Progress Notes (Addendum)
Patient discharged from [facility] on 03/30/23. Patient denies SI, plan, and intention. Suicide safety plan completed, reviewed with this RN, given to the patient, and a copy in the chart. Patient denies HI/AVH upon discharge. Patient is alert, oriented, and cooperative. RN provided patient with discharge paperwork and reviewed information with patient. Patient expressed that she understood all of the discharge instructions. Pt was satisfied with belongings returned to her from the locker and at bedside.

## 2023-03-30 NOTE — Progress Notes (Signed)
  [facility] Adult Case Management Discharge Plan :  Will you be returning to the same living situation after discharge:  No.pt will be going to [employer] home at [place], [place] [phone] At discharge, do you have transportation home?: Yes,  pt will be transported via [facility] Do you have the ability to pay for your medications: Yes,  pt has active medical coverage  Release of information consent forms completed and in the chart;  Patient's signature needed at discharge.  Patient to Follow up at:  Follow-up Information     [facility] Follow up on 04/25/2023.   Why: You have an appointment for medication management services on 04/24/22 at 11:00 am . The appointment will be Virtual.  The provider will send a link for your virtual appt approximately 10 minutes prior to your appt.  Just click on the link to login to your appt, and be in the "waiting room". Contact information: [address] [place] [address] [phone]         [facility] Follow up.   Why: You may call this provider to schedule a Virtual or in person appointment for therapy services. Contact information: [address] Skeeters [place], [place] [address] [phone]         [facility] Follow up.   Why: You may also go to this provider for medication management services. Contact information: [address] [place] [address] [phone]         [facility] Follow up.   Specialty: Addiction Medicine Why: You have been [facility] please arrive on 04/04/2023 at 9:00 am. Contact information: [address] [place] [address] [phone]                 Next level of care provider has access to [facility] Link:no  Safety Planning and Suicide Prevention discussed: Yes,  Safety Planning completed with Beckie Busing who has no  firearms in the home and has placed all sharps and medications in a secure place.     Has patient been referred to the Quitline?: Patient does not use tobacco/nicotine products  Patient has been referred for addiction treatment: Yes, the patient will follow up with an outpatient provider for substance use disorder. Psychiatrist/APP: appointment made. Pt has been accepted to [facility] in [facility]. Admit date is 08/03/2022 at 9:00 am.  Kathrynn Humble 03/30/2023, 4:36 PM

## 2023-03-30 NOTE — [facility] Group Notes (Signed)
Adult Psychoeducational Group Note  Date:  03/30/2023 Time:  10:31 AM  Group Topic/Focus:  Goals Group:   The focus of this group is to help patients establish daily goals to achieve during treatment and discuss how the patient can incorporate goal setting into their daily lives to aide in recovery.  Participation Level:  Did Not Attend  Participation Quality:  na  Affect:  na  Cognitive:  na  Insight: na  Engagement in Group:  na  Modes of Intervention:  na  Additional Comments:  Pt did not attend group  Burnett Sheng 03/30/2023, 10:31 AM

## 2023-03-30 NOTE — Discharge Instructions (Signed)

## 2023-03-30 NOTE — [facility] Suicide Risk Assessment (Cosign Needed Addendum)
[facility] Discharge Suicide Risk Assessment  Principal Problem: MDD (major depressive disorder), recurrent episode, severe (HCC) Discharge Diagnoses: Principal Problem:   MDD (major depressive disorder), recurrent episode, severe (HCC) Active Problems:   GAD (generalized anxiety disorder)   Polysubstance abuse (HCC)  Reason for Admission: worsening depression with suicidal ideations and plan   Hospital Summary  During the patient's hospitalization, patient had extensive initial psychiatric evaluation, and follow-up psychiatric evaluations every day.  Psychiatric diagnoses provided upon initial assessment:  MDD (major depressive disorder), recurrent episode, severe (HCC)  Generalized Anxiety Disorder  Polysubstance abuse   Patient's psychiatric medications were adjusted on admission:  - Continued Duloxetine 90 mg for depression and neuropathy - Continued Gabapentin 300 mg daily  - Continued home Seroquel 50 mg at bedtime  During the hospitalization, other adjustments were made to the patient's psychiatric medication regimen:  - Increased Gabapentin 600 mg daily for neuropathy  - Increased Seroquel 100 mg at bedtime for insomnia and AVH    Patient's care was discussed during the interdisciplinary team meeting every day during the hospitalization.  The patient denies having side effects to prescribed psychiatric medication.  Gradually, patient started adjusting to milieu. The patient was evaluated each day by a clinical provider to ascertain response to treatment. Improvement was noted by the patient's report of decreasing symptoms, improved sleep and appetite, affect, medication tolerance, behavior, and participation in unit programming.  Patient was asked each day to complete a self inventory noting mood, mental status, pain, new symptoms, anxiety and concerns.    Symptoms were reported as significantly decreased or resolved completely by discharge.   On day of discharge, the patient  reports that their mood is stable. The patient denied having suicidal thoughts prior discharge.  Patient denies having homicidal thoughts.  Patient denies having auditory hallucinations.  Patient denies any visual hallucinations or other symptoms of psychosis. The patient was motivated to continue taking medication with a goal of continued improvement in mental health.   The patient reports their target psychiatric symptoms of auditory and visual hallucinations responded well to the psychiatric medications, and the patient reports overall benefit other psychiatric hospitalization. Supportive psychotherapy was provided to the patient. The patient minimally participated in therapy while hospitalized. Coping skills, problem solving as well as relaxation therapies were also part of the unit programming.  Labs were reviewed with the patient, and abnormal results were discussed with the patient.  The patient is able to verbalize their individual safety plan to this provider.  # It is recommended to the patient to continue psychiatric medications as prescribed, after discharge from the hospital.    # It is recommended to the patient to follow up with your outpatient psychiatric provider and PCP.  # It was discussed with the patient, the impact of alcohol, drugs, tobacco have been there overall psychiatric and medical wellbeing, and total abstinence from substance use was recommended the patient.ed.  # Prescriptions provided or sent directly to preferred pharmacy at discharge. Patient agreeable to plan. Given opportunity to ask questions. Appears to feel comfortable with discharge.    # In the event of worsening symptoms, the patient is instructed to call the crisis hotline, 911 and or go to the nearest ED for appropriate evaluation and treatment of symptoms. To follow-up with primary care provider for other medical issues, concerns and or health care needs  # Patient was discharged to [facility]  with a  plan to follow up as noted below.  Total Time spent with patient: 40  minutes  Musculoskeletal: Strength & Muscle Tone: decreased  Gait & Station: Limited mobility, intermittently uses walker for ambulation  Patient leans: N/A  Psychiatric Specialty Exam  Presentation  General Appearance: Appropriate for Environment  Eye Contact:Fair  Speech:Normal Rate  Speech Volume:Normal  Handedness:Right   Mood and Affect  Mood:Euthymic  Duration of Depression Symptoms: Greater than two weeks  Affect:Flat   Thought Process  Thought Processes:Coherent  Descriptions of Associations:Intact  Orientation:Full (Time, Place and Person)  Thought Content:WDL  History of Schizophrenia/Schizoaffective disorder:Yes  Duration of Psychotic Symptoms:Less than six months  Hallucinations:Hallucinations: None  Ideas of Reference:None  Suicidal Thoughts:Suicidal Thoughts: No SI Active Intent and/or Plan: Without Intent SI Passive Intent and/or Plan: Without Intent  Homicidal Thoughts:Homicidal Thoughts: No   Sensorium  Memory:Immediate Fair; Recent Fair  Judgment:Intact  Insight:Present   Executive Functions  Concentration:Fair  Attention Span:Fair  Recall:Good  Fund of Knowledge:Good  Language:Fair   Psychomotor Activity  Psychomotor Activity:None   Assets  Assets:Desire for Improvement   Sleep  Sleep:Sleep: Good Number of Hours of Sleep: 9.5   Physical Exam: Physical Exam Constitutional:      Appearance: Normal appearance. She is obese. She is not ill-appearing or toxic-appearing.  Musculoskeletal:        General: Normal range of motion.  Neurological:     Mental Status: She is alert.  Psychiatric:        Attention and Perception: Attention and perception normal. She does not perceive auditory or visual hallucinations.        Mood and Affect: Mood is not anxious or depressed.        Speech: Speech normal.        Behavior: Behavior normal.         Thought Content: Thought content is not paranoid or delusional. Thought content does not include homicidal or suicidal ideation.    Review of Systems  Constitutional:  Negative for chills and fever.  Respiratory:  Negative for cough.   Cardiovascular:  Negative for chest pain.  Gastrointestinal:  Negative for constipation, diarrhea, nausea and vomiting.  Musculoskeletal:  Positive for back pain and joint pain.  Neurological:  Negative for weakness and headaches.  Psychiatric/Behavioral:  Negative for depression, hallucinations and suicidal ideas. The patient is not nervous/anxious.    Blood pressure 118/79, pulse 92, temperature 98.4 F (36.9 C), temperature source Oral, resp. rate 17, height 4\' 11"  (1.499 m), weight 75.3 kg, last menstrual period 06/07/2020, SpO2 100%. Body mass index is 33.53 kg/m.  Mental Status Per Nursing Assessment::   On Admission:  Suicidal ideation indicated by patient  Demographic Factors:  Low socioeconomic status, Living alone, Unemployed, and homeless  Loss Factors: Legal issues and Financial problems/change in socioeconomic status  Historical Factors: Prior suicide attempts, Family history of mental illness or substance abuse, and Impulsivity  Risk Reduction Factors:   Religious beliefs about death  Continued Clinical Symptoms:  Depression:   Comorbid alcohol abuse/dependence Hopelessness  Cognitive Features That Contribute To Risk:  None    Suicide Risk:  Moderate: Frequent suicidal ideation with limited intensity, and duration, some specificity in terms of plans, no associated intent, good self-control, limited dysphoria/symptomatology, some risk factors present, and identifiable protective factors, including available and accessible social support. Patient would report suicidal ideations contingent with current state of homelessness. Prior to evaluation with this Clinical research associate, the patient denied SI/HI/AVH.    Follow-up Information     [facility] Follow up on 04/25/2023.   Why: You have an  appointment for medication management services on 04/24/22 at 11:00 am . The appointment will be Virtual.  The provider will send a link for your virtual appt approximately 10 minutes prior to your appt.  Just click on the link to login to your appt, and be in the "waiting room". Contact information: [address] [place] [address] [phone]         [facility] Follow up.   Why: You may call this provider to schedule a Virtual or in person appointment for therapy services. Contact information: [address] [place], [place] [address] [phone]         [facility] Follow up.   Why: You may also go to this provider for medication management services. Contact information: [address] [place] [address] [phone]                 Plan Of Care/Follow-up recommendations:   Activity: as tolerated  Diet: heart healthy  Other: -Follow-up with your outpatient psychiatric provider -instructions on appointment date, time, and address (location) are provided to you in discharge paperwork.  -Take your psychiatric medications as prescribed at discharge - instructions are provided to you in the discharge paperwork  -Follow-up with outpatient primary care doctor and other specialists -for management of preventative medicine and chronic medical disease   -Testing: Follow-up with outpatient provider for abnormal lab results: None   -If you are prescribed an atypical antipsychotic medication, we recommend that your outpatient psychiatrist follow routine screening for side effects within 3 months of discharge, including monitoring: AIMS scale, height, weight, blood pressure, fasting lipid panel, HbA1c, and fasting blood sugar.   -Recommend total abstinence from alcohol, tobacco, and other illicit drug use at discharge.   -If your  psychiatric symptoms recur, worsen, or if you have side effects to your psychiatric medications, call your outpatient psychiatric provider, 911, [phone] or go to the nearest emergency department.  -If suicidal thoughts occur, immediately call your outpatient psychiatric provider, 911, [phone] or go to the nearest emergency department.  Signed: Peterson Ao, MD 03/30/2023, 12:17 PM

## 2023-03-30 NOTE — Group Note (Signed)
Recreation Therapy Group Note   Group Topic:Stress Management  Group Date: 03/30/2023 Start Time: 0947 End Time: 1009 Facilitators: Stephen Turnbaugh-McCall, LRT,CTRS Location: [address] Day[address]   Group Topic: Stress Management   Goal Area(s) Addresses:  Patient will actively participate in stress management techniques presented during session.  Patient will successfully identify benefit of practicing stress management post d/c.   Behavioral Response: Appropriate  Intervention: Relaxation exercise with ambient sound and script   Group Description: Guided Imagery. LRT provided education, instruction, and demonstration on practice of visualization via guided imagery. Patient was asked to participate in the technique introduced during session. LRT debriefed including topics of mindfulness, stress management and specific scenarios each patient could use these techniques. Patients were given suggestions of ways to access scripts post d/c and encouraged to explore Youtube and other apps available on smartphones, tablets, and computers.  Education: Stress Management, Discharge Planning.   Education Outcome: Acknowledges education   Affect/Mood: N/A   Participation Level: Did not attend    Clinical Observations/Individualized Feedback:      Plan: Continue to engage patient in RT group sessions 2-3x/week.   Gladis Soley-McCall, LRT,CTRS  03/30/2023 12:06 PM

## 2023-03-30 NOTE — Progress Notes (Signed)
   03/29/23 2010  Psych Admission Type (Psych Patients Only)  Admission Status Voluntary  Psychosocial Assessment  Patient Complaints Anxiety;Depression  Eye Contact Brief  Facial Expression Flat  Affect Depressed  Speech Soft;Slow  Interaction Assertive  Motor Activity Slow;Unsteady  Appearance/Hygiene Unremarkable  Behavior Characteristics Cooperative  Mood Depressed;Preoccupied  Thought Process  Coherency Circumstantial  Content WDL  Delusions None reported or observed  Perception WDL  Hallucination None reported or observed  Judgment WDL  Confusion WDL  Danger to Self  Current suicidal ideation? Denies  Agreement Not to Harm Self Yes  Description of Agreement verbal  Danger to Others  Danger to Others None reported or observed
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
## 2023-03-30 NOTE — Progress Notes (Signed)
   03/30/23 0800  Psych Admission Type (Psych Patients Only)  Admission Status Voluntary  Psychosocial Assessment  Patient Complaints Anxiety;Depression  Eye Contact Brief  Facial Expression Flat  Affect Anxious;Depressed  Speech Soft;Slow  Interaction Assertive  Motor Activity Slow;Unsteady  Appearance/Hygiene Unremarkable  Behavior Characteristics Cooperative;Appropriate to situation  Mood Depressed;Sad  Thought Process  Coherency Circumstantial  Content WDL  Delusions None reported or observed  Perception WDL  Hallucination None reported or observed  Judgment Impaired  Confusion None  Danger to Self  Current suicidal ideation? Passive ("When I discharge I am going to have SI")  Self-Injurious Behavior Some self-injurious ideation observed or expressed.  No lethal plan expressed   Agreement Not to Harm Self Yes  Description of Agreement Verbal  Danger to Others  Danger to Others None reported or observed  Danger to Others Abnormal  Harmful Behavior to others No threats or harm toward other people  Destructive Behavior No threats or harm toward property

## 2023-04-07 DIAGNOSIS — I517 Cardiomegaly: Secondary | ICD-10-CM | POA: Diagnosis not present

## 2023-04-07 DIAGNOSIS — Z79899 Other long term (current) drug therapy: Secondary | ICD-10-CM | POA: Diagnosis not present

## 2023-04-07 DIAGNOSIS — I10 Essential (primary) hypertension: Secondary | ICD-10-CM | POA: Diagnosis not present

## 2023-04-07 DIAGNOSIS — Z87891 Personal history of nicotine dependence: Secondary | ICD-10-CM | POA: Diagnosis not present

## 2023-04-07 DIAGNOSIS — R0602 Shortness of breath: Secondary | ICD-10-CM | POA: Diagnosis not present

## 2023-04-07 DIAGNOSIS — J8489 Other specified interstitial pulmonary diseases: Secondary | ICD-10-CM | POA: Diagnosis not present

## 2023-04-07 DIAGNOSIS — Z8659 Personal history of other mental and behavioral disorders: Secondary | ICD-10-CM | POA: Diagnosis not present

## 2023-04-07 DIAGNOSIS — R5383 Other fatigue: Secondary | ICD-10-CM | POA: Diagnosis not present

## 2023-04-07 DIAGNOSIS — M791 Myalgia, unspecified site: Secondary | ICD-10-CM | POA: Diagnosis not present

## 2023-04-07 DIAGNOSIS — Z7901 Long term (current) use of anticoagulants: Secondary | ICD-10-CM | POA: Diagnosis not present

## 2023-04-07 DIAGNOSIS — Z8739 Personal history of other diseases of the musculoskeletal system and connective tissue: Secondary | ICD-10-CM | POA: Diagnosis not present

## 2023-04-07 DIAGNOSIS — R918 Other nonspecific abnormal finding of lung field: Secondary | ICD-10-CM | POA: Diagnosis not present

## 2023-04-10 DIAGNOSIS — M7989 Other specified soft tissue disorders: Secondary | ICD-10-CM | POA: Diagnosis not present

## 2023-04-10 DIAGNOSIS — M25561 Pain in right knee: Secondary | ICD-10-CM | POA: Diagnosis not present

## 2023-04-10 DIAGNOSIS — R2242 Localized swelling, mass and lump, left lower limb: Secondary | ICD-10-CM | POA: Diagnosis not present

## 2023-04-10 DIAGNOSIS — F333 Major depressive disorder, recurrent, severe with psychotic symptoms: Secondary | ICD-10-CM | POA: Diagnosis not present

## 2023-04-10 DIAGNOSIS — M79604 Pain in right leg: Secondary | ICD-10-CM | POA: Diagnosis not present

## 2023-04-10 DIAGNOSIS — R45851 Suicidal ideations: Secondary | ICD-10-CM | POA: Diagnosis not present

## 2023-04-10 DIAGNOSIS — R079 Chest pain, unspecified: Secondary | ICD-10-CM | POA: Diagnosis not present

## 2023-04-10 DIAGNOSIS — D473 Essential (hemorrhagic) thrombocythemia: Secondary | ICD-10-CM | POA: Diagnosis not present

## 2023-04-10 DIAGNOSIS — Z0189 Encounter for other specified special examinations: Secondary | ICD-10-CM | POA: Diagnosis not present

## 2023-04-10 DIAGNOSIS — Z79899 Other long term (current) drug therapy: Secondary | ICD-10-CM | POA: Diagnosis not present

## 2023-04-10 DIAGNOSIS — F32A Depression, unspecified: Secondary | ICD-10-CM | POA: Diagnosis not present

## 2023-04-10 DIAGNOSIS — I5032 Chronic diastolic (congestive) heart failure: Secondary | ICD-10-CM | POA: Diagnosis not present

## 2023-04-10 DIAGNOSIS — G629 Polyneuropathy, unspecified: Secondary | ICD-10-CM | POA: Diagnosis not present

## 2023-04-10 DIAGNOSIS — I110 Hypertensive heart disease with heart failure: Secondary | ICD-10-CM | POA: Diagnosis not present

## 2023-04-10 DIAGNOSIS — J4489 Other specified chronic obstructive pulmonary disease: Secondary | ICD-10-CM | POA: Diagnosis not present

## 2023-04-10 DIAGNOSIS — R0602 Shortness of breath: Secondary | ICD-10-CM | POA: Diagnosis not present

## 2023-04-10 DIAGNOSIS — S0990XA Unspecified injury of head, initial encounter: Secondary | ICD-10-CM | POA: Diagnosis not present

## 2023-04-10 DIAGNOSIS — M79605 Pain in left leg: Secondary | ICD-10-CM | POA: Diagnosis not present

## 2023-04-10 DIAGNOSIS — S4992XA Unspecified injury of left shoulder and upper arm, initial encounter: Secondary | ICD-10-CM | POA: Diagnosis not present

## 2023-04-10 DIAGNOSIS — Z5901 Sheltered homelessness: Secondary | ICD-10-CM | POA: Diagnosis not present

## 2023-04-10 DIAGNOSIS — G8929 Other chronic pain: Secondary | ICD-10-CM | POA: Diagnosis not present

## 2023-04-10 DIAGNOSIS — M25562 Pain in left knee: Secondary | ICD-10-CM | POA: Diagnosis not present

## 2023-04-10 DIAGNOSIS — F29 Unspecified psychosis not due to a substance or known physiological condition: Secondary | ICD-10-CM | POA: Diagnosis not present

## 2023-04-10 DIAGNOSIS — R600 Localized edema: Secondary | ICD-10-CM | POA: Diagnosis not present

## 2023-04-19 ENCOUNTER — Telehealth: Payer: Self-pay

## 2023-04-19 NOTE — Transitions of Care (Post Inpatient/ED Visit) (Signed)
   04/19/2023  Name: JAYCEY GENS MRN: [id number] DOB: 1971/03/24  Today's TOC FU Call Status: Today's TOC FU Call Status:: Unsuccessful Call (2nd Attempt) Unsuccessful Call (2nd Attempt) Date: 04/19/23  Attempted to reach the patient regarding the most recent Inpatient/ED visit.  Follow Up Plan: Additional outreach attempts will be made to reach the patient to complete the Transitions of Care (Post Inpatient/ED visit) call.   Alan Ee, RN, BSN, CEN [employer]- Transition of Care Team.  [facility] [phone]

## 2023-04-19 NOTE — Transitions of Care (Post Inpatient/ED Visit) (Signed)
   04/19/2023  Name: Kathryn [name] MRN: [id number] DOB: January 04, 1971  Today's TOC FU Call Status: Today's TOC FU Call Status:: Unsuccessful Call (1st Attempt) Unsuccessful Call (1st Attempt) Date: 04/19/23  Attempted to reach the patient regarding the most recent Inpatient/ED visit.  Follow Up Plan: Additional outreach attempts will be made to reach the patient to complete the Transitions of Care (Post Inpatient/ED visit) call.   Alan Ee, RN, BSN, CEN [employer]- Transition of Care Team.  [facility] [phone]

## 2023-04-20 ENCOUNTER — Telehealth: Payer: Self-pay

## 2023-04-20 NOTE — Transitions of Care (Post Inpatient/ED Visit) (Signed)
   04/20/2023  Name: Kathryn [name] MRN: [id number] DOB: April 29, 1970  Today's TOC FU Call Status: Today's TOC FU Call Status:: Unsuccessful Call (3rd Attempt) Unsuccessful Call (3rd Attempt) Date: 04/20/23  Attempted to reach the patient regarding the most recent Inpatient/ED visit.  Follow Up Plan: No further outreach attempts will be made at this time. We have been unable to contact the patient.  Alan Ee, RN, BSN, CEN [employer]- Transition of Care Team.  [facility] [phone]

## 2023-05-06 ENCOUNTER — Emergency Department (HOSPITAL_COMMUNITY): Payer: Medicare HMO

## 2023-05-06 ENCOUNTER — Other Ambulatory Visit: Payer: Self-pay

## 2023-05-06 ENCOUNTER — Inpatient Hospital Stay (HOSPITAL_COMMUNITY)
Admission: EM | Admit: 2023-05-06 | Discharge: 2023-05-18 | DRG: 917 | Disposition: A | Discharge status: Home Health | Payer: Medicare HMO | Attending: Internal Medicine | Admitting: Internal Medicine

## 2023-05-06 DIAGNOSIS — F1410 Cocaine abuse, uncomplicated: Secondary | ICD-10-CM | POA: Diagnosis present

## 2023-05-06 DIAGNOSIS — I7410 Embolism and thrombosis of unspecified parts of aorta: Secondary | ICD-10-CM | POA: Diagnosis not present

## 2023-05-06 DIAGNOSIS — I63413 Cerebral infarction due to embolism of bilateral middle cerebral arteries: Secondary | ICD-10-CM

## 2023-05-06 DIAGNOSIS — I639 Cerebral infarction, unspecified: Secondary | ICD-10-CM | POA: Diagnosis present

## 2023-05-06 DIAGNOSIS — J101 Influenza due to other identified influenza virus with other respiratory manifestations: Secondary | ICD-10-CM | POA: Diagnosis present

## 2023-05-06 DIAGNOSIS — G9340 Encephalopathy, unspecified: Secondary | ICD-10-CM | POA: Diagnosis present

## 2023-05-06 DIAGNOSIS — Z86711 Personal history of pulmonary embolism: Secondary | ICD-10-CM

## 2023-05-06 DIAGNOSIS — I517 Cardiomegaly: Secondary | ICD-10-CM | POA: Diagnosis not present

## 2023-05-06 DIAGNOSIS — N179 Acute kidney failure, unspecified: Secondary | ICD-10-CM | POA: Diagnosis present

## 2023-05-06 DIAGNOSIS — T405X1A Poisoning by cocaine, accidental (unintentional), initial encounter: Secondary | ICD-10-CM | POA: Diagnosis present

## 2023-05-06 DIAGNOSIS — Z886 Allergy status to analgesic agent status: Secondary | ICD-10-CM

## 2023-05-06 DIAGNOSIS — F250 Schizoaffective disorder, bipolar type: Secondary | ICD-10-CM | POA: Diagnosis present

## 2023-05-06 DIAGNOSIS — F1020 Alcohol dependence, uncomplicated: Secondary | ICD-10-CM | POA: Diagnosis present

## 2023-05-06 DIAGNOSIS — M79671 Pain in right foot: Secondary | ICD-10-CM | POA: Diagnosis present

## 2023-05-06 DIAGNOSIS — E785 Hyperlipidemia, unspecified: Secondary | ICD-10-CM | POA: Diagnosis present

## 2023-05-06 DIAGNOSIS — I10 Essential (primary) hypertension: Secondary | ICD-10-CM | POA: Diagnosis present

## 2023-05-06 DIAGNOSIS — I2510 Atherosclerotic heart disease of native coronary artery without angina pectoris: Secondary | ICD-10-CM | POA: Diagnosis present

## 2023-05-06 DIAGNOSIS — Z885 Allergy status to narcotic agent status: Secondary | ICD-10-CM

## 2023-05-06 DIAGNOSIS — R414 Neurologic neglect syndrome: Secondary | ICD-10-CM | POA: Diagnosis present

## 2023-05-06 DIAGNOSIS — R29723 NIHSS score 23: Secondary | ICD-10-CM | POA: Diagnosis present

## 2023-05-06 DIAGNOSIS — G9349 Other encephalopathy: Secondary | ICD-10-CM | POA: Diagnosis present

## 2023-05-06 DIAGNOSIS — R531 Weakness: Secondary | ICD-10-CM | POA: Diagnosis present

## 2023-05-06 DIAGNOSIS — I6340 Cerebral infarction due to embolism of unspecified cerebral artery: Secondary | ICD-10-CM | POA: Diagnosis present

## 2023-05-06 DIAGNOSIS — Z79899 Other long term (current) drug therapy: Secondary | ICD-10-CM

## 2023-05-06 DIAGNOSIS — Z91018 Allergy to other foods: Secondary | ICD-10-CM

## 2023-05-06 DIAGNOSIS — N280 Ischemia and infarction of kidney: Secondary | ICD-10-CM | POA: Diagnosis present

## 2023-05-06 DIAGNOSIS — M25561 Pain in right knee: Secondary | ICD-10-CM | POA: Diagnosis not present

## 2023-05-06 DIAGNOSIS — I6389 Other cerebral infarction: Secondary | ICD-10-CM | POA: Diagnosis not present

## 2023-05-06 DIAGNOSIS — J4489 Other specified chronic obstructive pulmonary disease: Secondary | ICD-10-CM | POA: Diagnosis present

## 2023-05-06 DIAGNOSIS — M25461 Effusion, right knee: Secondary | ICD-10-CM | POA: Diagnosis not present

## 2023-05-06 DIAGNOSIS — M1712 Unilateral primary osteoarthritis, left knee: Secondary | ICD-10-CM | POA: Diagnosis present

## 2023-05-06 DIAGNOSIS — M25511 Pain in right shoulder: Secondary | ICD-10-CM | POA: Diagnosis not present

## 2023-05-06 DIAGNOSIS — Z7951 Long term (current) use of inhaled steroids: Secondary | ICD-10-CM

## 2023-05-06 DIAGNOSIS — Z803 Family history of malignant neoplasm of breast: Secondary | ICD-10-CM

## 2023-05-06 DIAGNOSIS — Z87892 Personal history of anaphylaxis: Secondary | ICD-10-CM

## 2023-05-06 DIAGNOSIS — I700 Atherosclerosis of aorta: Secondary | ICD-10-CM | POA: Diagnosis not present

## 2023-05-06 DIAGNOSIS — R935 Abnormal findings on diagnostic imaging of other abdominal regions, including retroperitoneum: Secondary | ICD-10-CM | POA: Diagnosis not present

## 2023-05-06 DIAGNOSIS — R464 Slowness and poor responsiveness: Secondary | ICD-10-CM | POA: Diagnosis not present

## 2023-05-06 DIAGNOSIS — R569 Unspecified convulsions: Secondary | ICD-10-CM | POA: Diagnosis not present

## 2023-05-06 DIAGNOSIS — Z7901 Long term (current) use of anticoagulants: Secondary | ICD-10-CM

## 2023-05-06 DIAGNOSIS — M1711 Unilateral primary osteoarthritis, right knee: Secondary | ICD-10-CM | POA: Diagnosis not present

## 2023-05-06 DIAGNOSIS — R29818 Other symptoms and signs involving the nervous system: Secondary | ICD-10-CM | POA: Diagnosis not present

## 2023-05-06 DIAGNOSIS — Z1152 Encounter for screening for COVID-19: Secondary | ICD-10-CM

## 2023-05-06 DIAGNOSIS — A419 Sepsis, unspecified organism: Secondary | ICD-10-CM | POA: Diagnosis not present

## 2023-05-06 DIAGNOSIS — E669 Obesity, unspecified: Secondary | ICD-10-CM | POA: Diagnosis present

## 2023-05-06 DIAGNOSIS — K219 Gastro-esophageal reflux disease without esophagitis: Secondary | ICD-10-CM | POA: Diagnosis present

## 2023-05-06 DIAGNOSIS — R4182 Altered mental status, unspecified: Secondary | ICD-10-CM | POA: Diagnosis not present

## 2023-05-06 DIAGNOSIS — I7419 Embolism and thrombosis of other parts of aorta: Secondary | ICD-10-CM | POA: Diagnosis not present

## 2023-05-06 DIAGNOSIS — M7989 Other specified soft tissue disorders: Secondary | ICD-10-CM | POA: Diagnosis not present

## 2023-05-06 DIAGNOSIS — F17210 Nicotine dependence, cigarettes, uncomplicated: Secondary | ICD-10-CM | POA: Diagnosis present

## 2023-05-06 DIAGNOSIS — Z5900 Homelessness unspecified: Secondary | ICD-10-CM

## 2023-05-06 DIAGNOSIS — Z888 Allergy status to other drugs, medicaments and biological substances status: Secondary | ICD-10-CM

## 2023-05-06 DIAGNOSIS — G928 Other toxic encephalopathy: Secondary | ICD-10-CM | POA: Diagnosis present

## 2023-05-06 DIAGNOSIS — Z6831 Body mass index (BMI) 31.0-31.9, adult: Secondary | ICD-10-CM

## 2023-05-06 DIAGNOSIS — E538 Deficiency of other specified B group vitamins: Secondary | ICD-10-CM | POA: Diagnosis present

## 2023-05-06 DIAGNOSIS — Z825 Family history of asthma and other chronic lower respiratory diseases: Secondary | ICD-10-CM

## 2023-05-06 DIAGNOSIS — R0689 Other abnormalities of breathing: Secondary | ICD-10-CM | POA: Diagnosis not present

## 2023-05-06 DIAGNOSIS — R9089 Other abnormal findings on diagnostic imaging of central nervous system: Secondary | ICD-10-CM | POA: Diagnosis not present

## 2023-05-06 DIAGNOSIS — Z9151 Personal history of suicidal behavior: Secondary | ICD-10-CM

## 2023-05-06 DIAGNOSIS — Z8249 Family history of ischemic heart disease and other diseases of the circulatory system: Secondary | ICD-10-CM

## 2023-05-06 DIAGNOSIS — Z91013 Allergy to seafood: Secondary | ICD-10-CM

## 2023-05-06 DIAGNOSIS — R404 Transient alteration of awareness: Secondary | ICD-10-CM | POA: Diagnosis not present

## 2023-05-06 DIAGNOSIS — D259 Leiomyoma of uterus, unspecified: Secondary | ICD-10-CM | POA: Diagnosis not present

## 2023-05-06 LAB — URINALYSIS, ROUTINE W REFLEX MICROSCOPIC
Bilirubin Urine: NEGATIVE
Glucose, UA: NEGATIVE mg/dL
Ketones, ur: NEGATIVE mg/dL
Leukocytes,Ua: NEGATIVE
Nitrite: NEGATIVE
Protein, ur: 30 mg/dL — AB
Specific Gravity, Urine: 1.019 (ref 1.005–1.030)
pH: 6 (ref 5.0–8.0)

## 2023-05-06 LAB — I-STAT CHEM 8, ED
BUN: 19 mg/dL (ref 6–20)
Calcium, Ion: 1.13 mmol/L — ABNORMAL LOW (ref 1.15–1.40)
Chloride: 106 mmol/L (ref 98–111)
Creatinine, Ser: 1.3 mg/dL — ABNORMAL HIGH (ref 0.44–1.00)
Glucose, Bld: 84 mg/dL (ref 70–99)
HCT: 43 % (ref 36.0–46.0)
Hemoglobin: 14.6 g/dL (ref 12.0–15.0)
Potassium: 4.4 mmol/L (ref 3.5–5.1)
Sodium: 140 mmol/L (ref 135–145)
TCO2: 25 mmol/L (ref 22–32)

## 2023-05-06 LAB — APTT: aPTT: 31 s (ref 24–36)

## 2023-05-06 LAB — RESP PANEL BY RT-PCR (RSV, FLU A&B, COVID)  RVPGX2
Influenza A by PCR: POSITIVE — AB
Influenza B by PCR: NEGATIVE
Resp Syncytial Virus by PCR: NEGATIVE
SARS Coronavirus 2 by RT PCR: NEGATIVE

## 2023-05-06 LAB — RAPID URINE DRUG SCREEN, HOSP PERFORMED
Amphetamines: NOT DETECTED
Barbiturates: NOT DETECTED
Benzodiazepines: NOT DETECTED
Cocaine: POSITIVE — AB
Opiates: NOT DETECTED
Tetrahydrocannabinol: NOT DETECTED

## 2023-05-06 LAB — COMPREHENSIVE METABOLIC PANEL
ALT: 20 U/L (ref 0–44)
AST: 35 U/L (ref 15–41)
Albumin: 3.9 g/dL (ref 3.5–5.0)
Alkaline Phosphatase: 88 U/L (ref 38–126)
Anion gap: 9 (ref 5–15)
BUN: 16 mg/dL (ref 6–20)
CO2: 23 mmol/L (ref 22–32)
Calcium: 9.1 mg/dL (ref 8.9–10.3)
Chloride: 105 mmol/L (ref 98–111)
Creatinine, Ser: 1.33 mg/dL — ABNORMAL HIGH (ref 0.44–1.00)
GFR, Estimated: 48 mL/min — ABNORMAL LOW (ref >60)
Glucose, Bld: 80 mg/dL (ref 70–99)
Potassium: 3.9 mmol/L (ref 3.5–5.1)
Sodium: 137 mmol/L (ref 135–145)
Total Bilirubin: 0.5 mg/dL (ref 0.0–1.2)
Total Protein: 8.4 g/dL — ABNORMAL HIGH (ref 6.5–8.1)

## 2023-05-06 LAB — SALICYLATE LEVEL: Salicylate Lvl: <7 mg/dL — ABNORMAL LOW (ref 7.0–30.0)

## 2023-05-06 LAB — BLOOD GAS, VENOUS
Acid-base deficit: 1.1 mmol/L (ref 0.0–2.0)
Bicarbonate: 24.3 mmol/L (ref 20.0–28.0)
Drawn by: 69862
O2 Saturation: 34 %
Patient temperature: 39.2
pCO2, Ven: 46 mm[Hg] (ref 44–60)
pH, Ven: 7.34 (ref 7.25–7.43)
pO2, Ven: <31 mm[Hg] — CL (ref 32–45)

## 2023-05-06 LAB — LACTIC ACID, PLASMA
Lactic Acid, Venous: 1.5 mmol/L (ref 0.5–1.9)
Lactic Acid, Venous: 1.1 mmol/L (ref 0.5–1.9)

## 2023-05-06 LAB — AMMONIA: Ammonia: 45 umol/L — ABNORMAL HIGH (ref 9–35)

## 2023-05-06 LAB — TSH: TSH: 0.118 u[IU]/mL — ABNORMAL LOW (ref 0.350–4.500)

## 2023-05-06 LAB — FOLATE: Folate: 17.2 ng/mL (ref >5.9)

## 2023-05-06 LAB — VITAMIN B12: Vitamin B-12: 260 pg/mL (ref 180–914)

## 2023-05-06 LAB — CBG MONITORING, ED: Glucose-Capillary: 71 mg/dL (ref 70–99)

## 2023-05-06 LAB — ACETAMINOPHEN LEVEL: Acetaminophen (Tylenol), Serum: 11 ug/mL (ref 10–30)

## 2023-05-06 LAB — MAGNESIUM: Magnesium: 2.3 mg/dL (ref 1.7–2.4)

## 2023-05-06 LAB — ETHANOL: Alcohol, Ethyl (B): <10 mg/dL (ref <10)

## 2023-05-06 MED ORDER — IOHEXOL 350 MG/ML SOLN
75.0000 mL | Freq: Once | INTRAVENOUS | Status: AC | PRN
  Administered 2023-05-06: 75 mL via INTRAVENOUS

## 2023-05-06 MED ORDER — METHYLPREDNISOLONE SODIUM SUCC 125 MG IJ SOLR
125.0000 mg | Freq: Once | INTRAMUSCULAR | Status: AC
  Administered 2023-05-06: 125 mg via INTRAVENOUS
  Filled 2023-05-06: qty 2

## 2023-05-06 MED ORDER — DIPHENHYDRAMINE HCL 50 MG/ML IJ SOLN
25.0000 mg | Freq: Once | INTRAMUSCULAR | Status: AC
  Administered 2023-05-06: 25 mg via INTRAVENOUS
  Filled 2023-05-06: qty 1

## 2023-05-06 MED ORDER — IOHEXOL 350 MG/ML SOLN: 80.0000 mL | Freq: Once | INTRAVENOUS | Status: DC | PRN

## 2023-05-06 MED ORDER — THIAMINE HCL 100 MG/ML IJ SOLN
250.0000 mg | Freq: Every day | INTRAVENOUS | Status: DC
  Administered 2023-05-09 – 2023-05-13 (×5): 250 mg via INTRAVENOUS
  Filled 2023-05-06 (×8): qty 2.5

## 2023-05-06 MED ORDER — LACTATED RINGERS IV SOLN: INTRAVENOUS | Status: AC

## 2023-05-06 MED ORDER — THIAMINE HCL 100 MG/ML IJ SOLN: 100.0000 mg | Freq: Every day | INTRAMUSCULAR | Status: DC

## 2023-05-06 MED ORDER — HEPARIN BOLUS VIA INFUSION: 4100.0000 [IU] | Freq: Once | INTRAVENOUS | Status: DC

## 2023-05-06 MED ORDER — DILTIAZEM HCL-DEXTROSE 125-5 MG/125ML-% IV SOLN (PREMIX): 2.5000 mg/h | INTRAVENOUS | Status: DC

## 2023-05-06 MED ORDER — THIAMINE HCL 100 MG/ML IJ SOLN
500.0000 mg | Freq: Three times a day (TID) | INTRAVENOUS | Status: AC
  Administered 2023-05-06 – 2023-05-08 (×4): 500 mg via INTRAVENOUS
  Filled 2023-05-06 (×9): qty 5

## 2023-05-06 MED ORDER — VANCOMYCIN HCL IN DEXTROSE 1-5 GM/200ML-% IV SOLN: 1000.0000 mg | Freq: Once | INTRAVENOUS | Status: DC

## 2023-05-06 MED ORDER — NALOXONE HCL 0.4 MG/ML IJ SOLN
0.4000 mg | Freq: Once | INTRAMUSCULAR | Status: AC
  Administered 2023-05-06: 0.4 mg via INTRAVENOUS
  Filled 2023-05-06: qty 1

## 2023-05-06 MED ORDER — LEVETIRACETAM IN NACL 1500 MG/100ML IV SOLN
1500.0000 mg | Freq: Once | INTRAVENOUS | Status: AC
  Administered 2023-05-06: 1500 mg via INTRAVENOUS
  Filled 2023-05-06: qty 100

## 2023-05-06 MED ORDER — FAMOTIDINE IN NACL 20-0.9 MG/50ML-% IV SOLN
20.0000 mg | Freq: Once | INTRAVENOUS | Status: AC
  Administered 2023-05-06: 20 mg via INTRAVENOUS
  Filled 2023-05-06: qty 50

## 2023-05-06 MED ORDER — ACETAMINOPHEN 650 MG RE SUPP
650.0000 mg | Freq: Once | RECTAL | Status: AC
  Administered 2023-05-06: 650 mg via RECTAL
  Filled 2023-05-06: qty 1

## 2023-05-06 MED ORDER — LACTATED RINGERS IV BOLUS
1000.0000 mL | Freq: Once | INTRAVENOUS | Status: AC
  Administered 2023-05-06: 1000 mL via INTRAVENOUS

## 2023-05-06 MED ORDER — CEFEPIME HCL 2 G IV SOLR
2.0000 g | Freq: Once | INTRAVENOUS | Status: DC
  Filled 2023-05-06: qty 12.5

## 2023-05-06 MED ORDER — IOHEXOL 350 MG/ML SOLN
80.0000 mL | Freq: Once | INTRAVENOUS | Status: AC | PRN
  Administered 2023-05-06: 80 mL via INTRAVENOUS

## 2023-05-06 MED ORDER — METRONIDAZOLE 500 MG/100ML IV SOLN
500.0000 mg | Freq: Once | INTRAVENOUS | Status: DC
Start: 2023-05-06 — End: 2023-05-06
  Filled 2023-05-06: qty 100

## 2023-05-06 MED ORDER — HEPARIN (PORCINE) 25000 UT/250ML-% IV SOLN
1450.0000 [IU]/h | INTRAVENOUS | Status: DC
Start: 2023-05-06 — End: 2023-05-17
  Administered 2023-05-06: 900 [IU]/h via INTRAVENOUS
  Administered 2023-05-08: 950 [IU]/h via INTRAVENOUS
  Administered 2023-05-09: 900 [IU]/h via INTRAVENOUS
  Administered 2023-05-10: 1100 [IU]/h via INTRAVENOUS
  Administered 2023-05-11: 1150 [IU]/h via INTRAVENOUS
  Administered 2023-05-12 – 2023-05-13 (×2): 1200 [IU]/h via INTRAVENOUS
  Administered 2023-05-15: 1400 [IU]/h via INTRAVENOUS
  Administered 2023-05-16: 1700 [IU]/h via INTRAVENOUS
  Filled 2023-05-06 (×13): qty 250

## 2023-05-06 NOTE — ED Provider Notes (Signed)
Assumed care on arrival to the ED from [facility].  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  Sent for neuro eval.   Seen by Dr. Derry Lory.  Not definitively needing ICU at this time.  Q2 hour neuro checks, continuous eeg.  Will discuss with medicine for admission.   The patients results and plan were reviewed and discussed.   Any x-rays performed were independently reviewed by myself.   Differential diagnosis were considered with the presenting HPI.  Medications  lactated ringers infusion ( Intravenous Rate/Dose Verify 05/06/23 2152)  thiamine (VITAMIN B1) 500 mg in sodium chloride 0.9 % 50 mL IVPB (has no administration in time range)    Followed by  thiamine (VITAMIN B1) 250 mg in sodium chloride 0.9 % 50 mL IVPB (has no administration in time range)    Followed by  thiamine (VITAMIN B1) injection 100 mg (has no administration in time range)  heparin ADULT infusion 100 units/mL (25000 units/237mL) (900 Units/hr Intravenous Rate/Dose Verify 05/06/23 2152)  naloxone [facility]) injection 0.4 mg (0.4 mg Intravenous Given 05/06/23 1600)  lactated ringers bolus 1,000 mL (1,000 mLs Intravenous Bolus 05/06/23 1601)  acetaminophen (TYLENOL) suppository 650 mg (650 mg Rectal Given 05/06/23 1758)  methylPREDNISolone sodium succinate (SOLU-MEDROL) 125 mg/2 mL injection 125 mg (125 mg Intravenous Given 05/06/23 1839)  diphenhydrAMINE (BENADRYL) injection 25 mg (25 mg Intravenous Given 05/06/23 1838)  famotidine (PEPCID) IVPB 20 mg premix (0 mg Intravenous Stopped 05/06/23 1914)  iohexol (OMNIPAQUE) 350 MG/ML injection 75 mL (75 mLs Intravenous Contrast Given 05/06/23 1957)  iohexol (OMNIPAQUE) 350 MG/ML injection 80 mL (80 mLs Intravenous Contrast Given 05/06/23 2017)  levETIRAcetam (KEPPRA) IVPB 1500 mg/ 100 mL premix (0 mg Intravenous Stopped 05/06/23 2052)    Vitals:   05/06/23 2032 05/06/23 2045 05/06/23 2100 05/06/23 2153  BP: (!) 148/86 139/83 131/78 (!) 144/96  Pulse: 88 80 75 74   Resp: 16 14 13  (!) 24  Temp: 99.9 F (37.7 C)   97.8 F (36.6 C)  TempSrc: Rectal   Axillary  SpO2: 95% 96% 96% 97%  Weight:      Height:        Final diagnoses:  Aortic thrombus (HCC)  Cerebrovascular accident (CVA), unspecified mechanism (HCC)    Admission/ observation were discussed with the admitting physician, patient and/or family and they are comfortable with the plan.     Melene Plan, DO 05/06/23 2258

## 2023-05-06 NOTE — ED Notes (Signed)
Patient transported to MRI 

## 2023-05-06 NOTE — Progress Notes (Signed)
Brief Neuro Note:  Spoke with Dr. Durwin Nora about this patient over phone. She was last seen at her baseline around 2300 on 05/05/23. Been asleep all day today so family brought her to the ED. Most of her hx is under MRN: [id number]. Has extensive psych hx, also EtOh and substance use. MRI Brain shows embolic appearing infarcts, largest in left parietal lobe. CTA with no LVO. Not a candidate for tnkase due to outside window, not a candidate for thrombectomy due to no LVO. 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 Hard to be sure if there is an underlying dissection but CT aortic dissection study ordered and pending. 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  Differential for her encephalopath includes: seizure, cocaine induced leukoencephalopathy, Thiamine deficiency, aortic dissection?  Plan: - transfer to [facility] to ED for neuro evaluation. - consider heparin if aortic dissection has been completely ruled out. IF we do heparin gtt, recommend no bolus dosing and using Neuro scale with goal Hpearin levels etween 0.3-0.5. - Thiamine levels, start Thiamine Wernicke's dosing. - B12, Folate, TSH, PT/INR and APTT.  Erick Blinks [facility]

## 2023-05-06 NOTE — ED Triage Notes (Signed)
Ems called by family stating pt is altered. Family could not go into detail about and pt history or why she is not acting normal. Pt is hard to arouse.

## 2023-05-06 NOTE — Progress Notes (Addendum)
PHARMACY - ANTICOAGULATION CONSULT NOTE  Pharmacy Consult for heparin Indication:  aortic thrombus  Not on File  Patient Measurements: Height: 5\' 2"  (157.5 cm) Weight: 79 kg (174 lb 1.6 oz) IBW/kg (Calculated) : 50.1 Heparin Dosing Weight: 67.5 kg  Vital Signs: Temp: 102.5 F (39.2 C) (01/19 1529) Temp Source: Rectal (01/19 1529) BP: 162/100 (01/19 1800) Pulse Rate: 97 (01/19 1800)  Labs: Recent Labs    05/06/23 1602 05/06/23 1605  HGB 14.6  --   HCT 43.0  --   CREATININE 1.30* 1.33*    Estimated Creatinine Clearance: 47.1 mL/min (A) (by C-G formula based on SCr of 1.33 mg/dL (H)).  Medical History: No past medical history on file.  Assessment: 53 y/o female presenting with altered mental status. PMH unknown. CT imaging shows Large intraluminal filling defect within the aortic arch, consistent with thromboembolism. Pharmacy has been consulted to initiate heparin infusion. Per chart review, patient is not on anticoagulation prior to admission.  Per neurology, will initiate infusion without bolus and target lower heparin level goal (0.3-0.5) due to likely cardioembolic stroke.  Baseline labs: CBC, INR, and aPTT ordered  Goal of Therapy:  Heparin level 0.3-0.5 units/ml Monitor platelets by anticoagulation protocol: Yes   Plan:  Start heparin infusion at 900 units/hr Check anti-Xa level in 6 hours and daily while on heparin Continue to monitor H&H and platelets  Thank you for involving pharmacy in this patient's care.   Rockwell Alexandria, PharmD Clinical Pharmacist 05/06/2023 8:36 PM

## 2023-05-06 NOTE — ED Notes (Signed)
Received report from [place].

## 2023-05-06 NOTE — ED Provider Notes (Signed)
[place] EMERGENCY DEPARTMENT AT [facility] Provider Note   CSN: [id number] Arrival date & time: 05/06/23  1517     History  Chief Complaint  Patient presents with   Altered Mental Status    Kathryn [name] is a 53 y.o. female.   Altered Mental Status Patient presents for altered mental status.  Medical history is unknown.  She arrives via EMS from what appeared to be family members home.  When they arrived on scene, she was seated in a rolling walker.  They are unsure if it was her walker.  She was somnolent but would wake up enough not to fall over.  During transit, she would say some short phrases such as "give me a minute".  She remains somnolent.  Vital signs were quite normal with EMS.  Family on scene reports that she has been in this state of minimal responsiveness since yesterday evening.     Home Medications Prior to Admission medications   Not on File      Allergies    Patient has no allergy information on record.    Review of Systems   Review of Systems  Unable to perform ROS: Mental status change    Physical Exam Updated Vital Signs BP (!) 148/86 (BP Location: Right Arm)   Pulse 88   Temp 99.9 F (37.7 C) (Rectal)   Resp 16   Ht 5\' 2"  (1.575 m)   Wt 79 kg   SpO2 95%   BMI 31.84 kg/m  Physical Exam Vitals and nursing note reviewed.  Constitutional:      General: She is not in acute distress.    Appearance: She is well-developed. She is not toxic-appearing or diaphoretic.  HENT:     Head: Normocephalic and atraumatic.     Right Ear: External ear normal.     Left Ear: External ear normal.     Nose: Nose normal.     Mouth/Throat:     Mouth: Mucous membranes are moist.  Eyes:     Conjunctiva/sclera: Conjunctivae normal.     Comments: Pinpoint pupils  Cardiovascular:     Rate and Rhythm: Normal rate and regular rhythm.     Heart sounds: No murmur heard. Pulmonary:     Effort: Pulmonary effort is normal. No respiratory distress.      Breath sounds: Normal breath sounds. No wheezing or rales.  Chest:     Chest wall: No tenderness.  Abdominal:     General: There is no distension.     Palpations: Abdomen is soft.     Tenderness: There is no abdominal tenderness.  Musculoskeletal:        General: No swelling, tenderness or deformity.     Cervical back: Normal range of motion and neck supple.     Right lower leg: No edema.     Left lower leg: No edema.  Skin:    General: Skin is warm and dry.     Coloration: Skin is not jaundiced or pale.  Neurological:     Mental Status: She is lethargic.     GCS: GCS eye subscore is 1. GCS verbal subscore is 4. GCS motor subscore is 5.     Cranial Nerves: No facial asymmetry.     Comments: Moving all extremities.  Psychiatric:        Mood and Affect: Mood normal.        Behavior: Behavior normal.     ED Results / Procedures / Treatments  Labs (all labs ordered are listed, but only abnormal results are displayed) Labs Reviewed  RESP PANEL BY RT-PCR (RSV, FLU A&B, COVID)  RVPGX2 - Abnormal; Notable for the following components:      Result Value   Influenza A by PCR POSITIVE (*)    All other components within normal limits  COMPREHENSIVE METABOLIC PANEL - Abnormal; Notable for the following components:   Creatinine, Ser 1.33 (*)    Total Protein 8.4 (*)    GFR, Estimated 48 (*)    All other components within normal limits  URINALYSIS, ROUTINE W REFLEX MICROSCOPIC - Abnormal; Notable for the following components:   Hgb urine dipstick SMALL (*)    Protein, ur 30 (*)    Bacteria, UA RARE (*)    All other components within normal limits  AMMONIA - Abnormal; Notable for the following components:   Ammonia 45 (*)    All other components within normal limits  BLOOD GAS, VENOUS - Abnormal; Notable for the following components:   pO2, Ven <31 (*)    All other components within normal limits  RAPID URINE DRUG SCREEN, HOSP PERFORMED - Abnormal; Notable for the following  components:   Cocaine POSITIVE (*)    All other components within normal limits  SALICYLATE LEVEL - Abnormal; Notable for the following components:   Salicylate Lvl <7.0 (*)    All other components within normal limits  I-STAT CHEM 8, ED - Abnormal; Notable for the following components:   Creatinine, Ser 1.30 (*)    Calcium, Ion 1.13 (*)    All other components within normal limits  CULTURE, BLOOD (ROUTINE X 2)  CULTURE, BLOOD (ROUTINE X 2)  C DIFFICILE QUICK SCREEN W PCR REFLEX    ETHANOL  MAGNESIUM  LACTIC ACID, PLASMA  LACTIC ACID, PLASMA  ACETAMINOPHEN LEVEL  CBC WITH DIFFERENTIAL/PLATELET  PROTIME-INR  APTT  VITAMIN B12  VITAMIN B1  FOLATE  TSH  HEPARIN LEVEL (UNFRACTIONATED)  CBC  CBG MONITORING, ED    EKG EKG Interpretation Date/Time:  Sunday May 06 2023 15:32:13 EST Ventricular Rate:  91 PR Interval:  148 QRS Duration:  89 QT Interval:  344 QTC Calculation: 424 R Axis:   102  Text Interpretation: Sinus rhythm Prominent P waves, nondiagnostic Right axis deviation Borderline repolarization abnormality Confirmed by Gloris Manchester [phone]) on 05/06/2023 3:56:34 PM  Radiology CT Angio Chest/Abd/Pel for Dissection W and/or Wo Contrast Result Date: 05/06/2023 CLINICAL DATA:  Acute aortic syndrome suspected based on findings on CTA head and neck. Altered mental status. EXAM: CT ANGIOGRAPHY CHEST, ABDOMEN AND PELVIS TECHNIQUE: Multidetector CT imaging through the chest, abdomen and pelvis was performed using the standard protocol during bolus administration of intravenous contrast. Multiplanar reconstructed images and MIPs were obtained and reviewed to evaluate the vascular anatomy. RADIATION DOSE REDUCTION: This exam was performed according to the departmental dose-optimization program which includes automated exposure control, adjustment of the mA and/or kV according to patient size and/or use of iterative reconstruction technique. CONTRAST:  80mL OMNIPAQUE IOHEXOL 350 MG/ML  SOLN COMPARISON:  Same day CTA head and neck; chest radiograph 05/06/2023; CT chest abdomen pelvis without IV contrast 05/06/2023 FINDINGS: CTA CHEST FINDINGS Cardiovascular: Intraluminal filling defect in the aortic arch measuring 3.4 x 1.4 x 2.2 cm. Aortic root and ascending aorta are partially obscured by motion artifact. No definite dissection flap. No aneurysm. There are additional small filling defects in the descending aorta measuring 4 mm (5/44 and 5/76). The aortic arch branch vessels widely patent. Normal  heart size. No pericardial effusion. No central pulmonary embolism. Mediastinum/Nodes: Bowing of the posterior trachea compatible with expiratory phase. Thyroid and esophagus are unremarkable. No thoracic adenopathy. Lungs/Pleura: Hypoventilation changes in the lungs. No focal consolidation, pleural effusion, or pneumothorax. Musculoskeletal: No acute fracture. Review of the MIP images confirms the above findings. CTA ABDOMEN AND PELVIS FINDINGS VASCULAR Aorta: Normal caliber abdominal aorta without dissection. Mild aortic atherosclerotic calcification. No intraluminal filling defects. Celiac: Patent without aneurysm or dissection. SMA: Patent without aneurysm or dissection. Renals: Patent without aneurysm or dissection. IMA: Patent. Inflow: Patent without aneurysm or dissection. Veins: No obvious venous abnormality within the limitations of this arterial phase study. Review of the MIP images confirms the above findings. NON-VASCULAR Hepatobiliary: No acute abnormality. Pancreas: Unremarkable. Spleen: Unremarkable. Adrenals/Urinary Tract: Normal adrenal glands. Wedge-shaped filling defect in the inferior pole of the right kidney (series 9/image 109) is favored to represent a chronic scar though infarct could appear similarly. Stomach/Bowel: No bowel obstruction or bowel wall thickening. Stomach and appendix are within normal limits. Lymphatic: No lymphadenopathy. Reproductive: No acute abnormality. Other:  No free intraperitoneal fluid or air. Musculoskeletal: No acute fracture.  Posterior fusion L3-L5. Review of the MIP images confirms the above findings. IMPRESSION: 1. 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 Additional small filling defects in the descending aorta measuring 4 mm. No definite dissection flap. No aneurysm. 2. Wedge-shaped filling defect in the inferior pole of the right kidney is favored to represent a chronic scar though infarct could appear similarly. Aortic Atherosclerosis (ICD10-I70.0). Electronically Signed   By: Minerva Fester M.D.   On: 05/06/2023 20:35   MR BRAIN WO CONTRAST Result Date: 05/06/2023 CLINICAL DATA:  Altered mental status EXAM: MRI HEAD WITHOUT CONTRAST TECHNIQUE: Multiplanar, multiecho pulse sequences of the brain and surrounding structures were obtained without intravenous contrast. COMPARISON:  CTA head neck 05/06/2023 FINDINGS: Brain: There is multifocal bilateral acute ischemia, greatest in the left parietal and occipital lobe, but also present in the posterior right hemisphere. No acute or chronic hemorrhage. There is multifocal hyperintense T2-weighted signal within the white matter. Generalized volume loss. The midline structures are normal. Vascular: Normal flow voids. Skull and upper cervical spine: Normal calvarium and skull base. Visualized upper cervical spine and soft tissues are normal. Sinuses/Orbits:Left mastoid effusion. Paranasal sinuses are clear. Normal orbits. IMPRESSION: Multifocal bilateral acute ischemia, greatest in the left parietal and occipital lobe, but also present in the posterior right hemisphere. No hemorrhage or mass effect. Electronically Signed   By: Deatra Robinson M.D.   On: 05/06/2023 20:25   CT ANGIO HEAD NECK W WO CM Result Date: 05/06/2023 CLINICAL DATA:  Acute neurologic deficit EXAM: CT ANGIOGRAPHY HEAD AND NECK WITH AND WITHOUT CONTRAST TECHNIQUE: Multidetector CT imaging of the head and neck  was performed using the standard protocol during bolus administration of intravenous contrast. Multiplanar CT image reconstructions and MIPs were obtained to evaluate the vascular anatomy. Carotid stenosis measurements (when applicable) are obtained utilizing NASCET criteria, using the distal internal carotid diameter as the denominator. RADIATION DOSE REDUCTION: This exam was performed according to the departmental dose-optimization program which includes automated exposure control, adjustment of the mA and/or kV according to patient size and/or use of iterative reconstruction technique. CONTRAST:  75mL OMNIPAQUE IOHEXOL 350 MG/ML SOLN COMPARISON:  None Available. FINDINGS: CTA NECK FINDINGS Skeleton: No acute abnormality or high grade bony spinal canal stenosis. Other neck: Normal pharynx, larynx and major salivary glands. No cervical lymphadenopathy. Unremarkable thyroid  gland. Upper chest: No pneumothorax or pleural effusion. No nodules or masses. Aortic arch: There is no calcific atherosclerosis of the aortic arch. There is a large intraluminal filling defect within the aortic arch, consistent with thromboembolism RIGHT carotid system: Normal without aneurysm, dissection or stenosis. LEFT carotid system: Normal without aneurysm, dissection or stenosis. Vertebral arteries: Codominant configuration. There is no dissection, occlusion or flow-limiting stenosis to the skull base (V1-V3 segments). CTA HEAD FINDINGS POSTERIOR CIRCULATION: Vertebral arteries are normal. No proximal occlusion of the anterior or inferior cerebellar arteries. Basilar artery is normal. Superior cerebellar arteries are normal. Posterior cerebral arteries are normal. ANTERIOR CIRCULATION: Intracranial internal carotid arteries are normal. Anterior cerebral arteries are normal. Middle cerebral arteries are normal. Venous sinuses: As permitted by contrast timing, patent. Anatomic variants: Fetal origin of the right posterior cerebral artery.  Review of the MIP images confirms the above findings. IMPRESSION: 1. Large intraluminal filling defect within the aortic arch, consistent with thromboembolism. An aortic CTA is recommended. 2. No emergent large vessel occlusion or high-grade stenosis of the intracranial arteries. Electronically Signed   By: Deatra Robinson M.D.   On: 05/06/2023 20:19   DG Chest Port 1 View Result Date: 05/06/2023 CLINICAL DATA:  Sepsis presentation.  Mental status changes. EXAM: PORTABLE CHEST 1 VIEW COMPARISON:  02/15/2023. FINDINGS: Allowing for the AP technique and poor inspiration, there is probably mild cardiomegaly. There may be mild pulmonary venous hypertension but there is no frank edema, infiltrate, collapse or effusion. IMPRESSION: Poor inspiration. Probable mild cardiomegaly and mild pulmonary venous hypertension. No frank edema, infiltrate or collapse. Electronically Signed   By: Paulina Fusi M.D.   On: 05/06/2023 17:34   CT CHEST ABDOMEN PELVIS WO CONTRAST Result Date: 05/06/2023 CLINICAL DATA:  Altered level of consciousness, sepsis EXAM: CT CHEST, ABDOMEN AND PELVIS WITHOUT CONTRAST TECHNIQUE: Multidetector CT imaging of the chest, abdomen and pelvis was performed following the standard protocol without IV contrast. Examination was performed without contrast at the request of the ordering clinician. Evaluation of the soft tissues, solid viscera, and vascular structures is limited. RADIATION DOSE REDUCTION: This exam was performed according to the departmental dose-optimization program which includes automated exposure control, adjustment of the mA and/or kV according to patient size and/or use of iterative reconstruction technique. COMPARISON:  07/26/2022 FINDINGS: CT CHEST FINDINGS Cardiovascular: Unenhanced imaging of the heart demonstrates mild cardiomegaly without pericardial effusion. Normal caliber of the thoracic aorta. Evaluation of the vascular lumen is limited without IV contrast. Mediastinum/Nodes: No  enlarged mediastinal, hilar, or axillary lymph nodes. Thyroid gland, trachea, and esophagus demonstrate no significant findings. Lungs/Pleura: No acute airspace disease, effusion, or pneumothorax. Central airways are patent. Musculoskeletal: No acute or destructive bony abnormalities. Reconstructed images demonstrate no additional findings. CT ABDOMEN PELVIS FINDINGS Hepatobiliary: Unremarkable unenhanced appearance of the liver and gallbladder. Pancreas: Unremarkable unenhanced appearance. Spleen: Unremarkable unenhanced appearance. Adrenals/Urinary Tract: No urinary tract calculi or obstructive uropathy. The adrenals and bladder are unremarkable. Stomach/Bowel: No bowel obstruction or ileus. Normal appendix right lower quadrant. No bowel wall thickening or inflammatory change. Vascular/Lymphatic: Aortic atherosclerosis. No enlarged abdominal or pelvic lymph nodes. Reproductive: Uterus is lobular and slightly enlarged consistent with underlying fibroids. No adnexal masses. Other: No free fluid or free intraperitoneal gas. No abdominal wall hernia. Musculoskeletal: No acute or destructive bony abnormalities. Postsurgical changes from L3 through L5. Reconstructed images demonstrate no additional findings. IMPRESSION: 1. No acute intrathoracic, intra-abdominal, or intrapelvic process on this unenhanced exam. 2. Cardiomegaly. 3. Fibroid uterus. 4.  Aortic Atherosclerosis (  ICD10-I70.0). Electronically Signed   By: Sharlet Salina M.D.   On: 05/06/2023 17:33   CT HEAD WO CONTRAST Result Date: 05/06/2023 CLINICAL DATA:  Altered level of consciousness EXAM: CT HEAD WITHOUT CONTRAST TECHNIQUE: Contiguous axial images were obtained from the base of the skull through the vertex without intravenous contrast. RADIATION DOSE REDUCTION: This exam was performed according to the departmental dose-optimization program which includes automated exposure control, adjustment of the mA and/or kV according to patient size and/or use of  iterative reconstruction technique. COMPARISON:  12/11/2022 FINDINGS: Brain: No acute infarct or hemorrhage. Lateral ventricles and midline structures are unremarkable. No acute extra-axial fluid collections. No mass effect. Vascular: No hyperdense vessel or unexpected calcification. Skull: Normal. Negative for fracture or focal lesion. Sinuses/Orbits: No acute finding. Other: None. IMPRESSION: 1. No acute intracranial process. Electronically Signed   By: Sharlet Salina M.D.   On: 05/06/2023 17:28    Procedures Procedures    Medications Ordered in ED Medications  lactated ringers infusion ( Intravenous New Bag/Given 05/06/23 1743)  levETIRAcetam (KEPPRA) IVPB 1500 mg/ 100 mL premix (1,500 mg Intravenous New Bag/Given 05/06/23 2034)  thiamine (VITAMIN B1) 500 mg in sodium chloride 0.9 % 50 mL IVPB (has no administration in time range)    Followed by  thiamine (VITAMIN B1) 250 mg in sodium chloride 0.9 % 50 mL IVPB (has no administration in time range)    Followed by  thiamine (VITAMIN B1) injection 100 mg (has no administration in time range)  heparin bolus via infusion 4,100 Units (has no administration in time range)  heparin ADULT infusion 100 units/mL (25000 units/27mL) (has no administration in time range)  naloxone [facility]) injection 0.4 mg (0.4 mg Intravenous Given 05/06/23 1600)  lactated ringers bolus 1,000 mL (1,000 mLs Intravenous Bolus 05/06/23 1601)  acetaminophen (TYLENOL) suppository 650 mg (650 mg Rectal Given 05/06/23 1758)  methylPREDNISolone sodium succinate (SOLU-MEDROL) 125 mg/2 mL injection 125 mg (125 mg Intravenous Given 05/06/23 1839)  diphenhydrAMINE (BENADRYL) injection 25 mg (25 mg Intravenous Given 05/06/23 1838)  famotidine (PEPCID) IVPB 20 mg premix (0 mg Intravenous Stopped 05/06/23 1914)  iohexol (OMNIPAQUE) 350 MG/ML injection 75 mL (75 mLs Intravenous Contrast Given 05/06/23 1957)  iohexol (OMNIPAQUE) 350 MG/ML injection 80 mL (80 mLs Intravenous Contrast Given  05/06/23 2017)    ED Course/ Medical Decision Making/ A&P                                 Medical Decision Making Amount and/or Complexity of Data Reviewed Labs: ordered. Radiology: ordered.  Risk OTC drugs. Prescription drug management.   This patient presents to the ED for concern of altered mental status, this involves an extensive number of treatment options, and is a complaint that carries with it a high risk of complications and morbidity.  The differential diagnosis includes CVA, seizure, head trauma, polypharmacy, intentional overdose, anoxic brain injury, meningitis   Co morbidities that complicate the patient evaluation  HTN, asthma, arthritis, neuropathy, polysubstance abuse, depression, anxiety   Additional history obtained:  Additional history obtained from EMS, patient's family External records from outside source obtained and reviewed including EMR   Lab Tests:  I Ordered, and personally interpreted labs.  The pertinent results include: Positive for influenza A, UDS positive for cocaine   Imaging Studies ordered:  I ordered imaging studies including CT of head, chest, abdomen, pelvis; CTA of head and neck; CTA dissection study; MRI brain I  independently visualized and interpreted imaging which showed large intraluminal filling defect within aortic arch consistent with thromboembolism; multifocal bilateral ischemic strokes, greatest in the left parietal and occipital lobe; wedge-shaped filling defect in inferior pole of right kidney, possibly representing infarct I agree with the radiologist interpretation   Cardiac Monitoring: / EKG:  The patient was maintained on a cardiac monitor.  I personally viewed and interpreted the cardiac monitored which showed an underlying rhythm of: Sinus rhythm   Consultations Obtained:  I requested consultation with the neurologist, Dr. Derry Lory,  and discussed lab and imaging findings as well as pertinent plan - they  recommend: Heparin once aortic dissection has been ruled out; initiation of Keppra; ED to ED transfer to [facility]   Problem List / ED Course / Critical interventions / Medication management  Patient presenting for altered mental status.  This is reportedly since yesterday evening.  On arrival, she is GCS 10.  She has no external evidence of recent trauma.  Her trousers are soiled in diarrhea.  Abdomen is soft and without tenderness.  Breathing is unlabored.  She will arouse to physical stimuli.  Patient was placed on monitor.  Workup was initiated.  On check of rectal temperature, patient had fever of 102.5 degrees.  Additional workup was initiated.  Will initiate broad-spectrum antibiotics.  Patient had no response to Narcan.  She remained normotensive with normal heart rate.  Although patient arrives as a [employer] Doe, her identity was confirmed.  Per chart review, her medical history includes HTN, asthma, neuropathy, arthritis, homelessness, polysubstance abuse, depression, anxiety.  She has had 3 suicide attempts in the past.  She has had 2 recent psychiatric hospital admissions over the past 2 months.  Home medications at time of discharge 1 month ago were duloxetine, gabapentin, Seroquel, amlodipine, Prilosec, and Breo Ellipta.  This does raise concern suspicion of polypharmacy/overdose as contributing cause of her altered mental state.  On patient's chart to be merged, she is noted to have documented anaphylactic reaction to Tylenol.  On reassessment, patient has no areas of swelling.  Breathing remains unlabored.  She remains normotensive.  She was given steroid and histamine blockers as a precaution.  She tested positive for influenza A, which does explain her fever on arrival.  There is no evidence on imaging or urinalysis to suggest a bacterial infection.  Antibiotics were discontinued.  Patient's family member came and was able to provide further history.  Family member reports that she gets quite  sleepy from a medication that she takes, although she cannot remember the name of it.  Patient was sleeping throughout most of the day 2 days ago.  She called her family member last night at 11 PM to state that she was going to continue taking the medication.  She has not fully woken up since then.  Family member has low suspicion of self-harm.  Patient remains very somnolent.  CTA of head and neck and MRI were ordered to further evaluate.  On CTA of head and neck, a thrombus was identified in aortic arch.  MRI does show evidence of ischemic strokes.  I discussed with neurologist on-call, Dr. Derry Lory who recommends initiation of heparin after aortic dissection has been ruled out.  CTA dissection study was ordered.  Location of strokes does not explain her diminished responsiveness.  Patient may be having seizures.  Neurology recommends initiation of Keppra, which was ordered.  Patient was accepted in transfer by Dr. Melene Plan.  She was transferred for further  management. I ordered medication including IV fluids for hydration; Tylenol for antipyresis; Solu-Medrol and Benadryl for prevention of allergic reaction; Keppra for seizure prophylaxis; heparin for aortic arch thrombus Reevaluation of the patient after these medicines showed that the patient stayed the same I have reviewed the patients home medicines and have made adjustments as needed   Social Determinants of Health:  History of polysubstance abuse and psychiatric illness  CRITICAL CARE Performed by: Gloris Manchester   Total critical care time: 45 minutes  Critical care time was exclusive of separately billable procedures and treating other patients.  Critical care was necessary to treat or prevent imminent or life-threatening deterioration.  Critical care was time spent personally by me on the following activities: development of treatment plan with patient and/or surrogate as well as nursing, discussions with consultants, evaluation of  patient's response to treatment, examination of patient, obtaining history from patient or surrogate, ordering and performing treatments and interventions, ordering and review of laboratory studies, ordering and review of radiographic studies, pulse oximetry and re-evaluation of patient's condition.          Final Clinical Impression(s) / ED Diagnoses Final diagnoses:  Aortic thrombus [facility])  Cerebrovascular accident (CVA), unspecified mechanism [facility])    Rx / DC Orders ED Discharge Orders     None         Gloris Manchester, MD 05/06/23 2046

## 2023-05-06 NOTE — ED Notes (Signed)
Family member called, very rude. Stated they wanted to speak with nurse, I said the nurse was unavailable at the moment. He started cussing and yelling. I stated that it will not be tolerated that I will hang up the phone. He started cussing again so I hung up the phone..  Pt called back 4 minutes later to apologize. Wants the nurse to call him back at [phone]

## 2023-05-06 NOTE — ED Notes (Signed)
Patient transported to CT 

## 2023-05-06 NOTE — Consult Note (Signed)
NEUROLOGY CONSULT NOTE   Date of service: May 06, 2023 Patient Name: Kathryn [name] MRN:  [id number] DOB:  [birth year] Chief Complaint: "encephalopathy" Requesting Provider: Melene Plan, DO  History of Present Illness  Kathryn [name] is a 53 y.o. female with hx of hypertension, lumbar stenosis, schizoaffective disorder, bipolar type, and polysubstance abuse who was last seen at her baseline around 2300 on 05/05/23. Been asleep all day today so family brought her to the ED. Most of her hx is under MRN: [id number].  MRI Brain shows embolic appearing infarcts, largest in left parietal lobe. CTA with no LVO. Not a candidate for tnkase due to outside window, not a candidate for thrombectomy due to no LVO. 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 CT aortic dissection does not show a dissection. UDS is positive for cocaine. She was started on heparin gtt and transferred ED to ED to [facility] for evaluation and workup.  On my evaluation, briefly opens eyes and makes ?eye contact to noxious stimuli, tosses and turns in the bed but does not follow commands. To pinch, she will yell but otherwise is pretty much unresponsive but seems to be protecting her airway.  I called her significant other listed in the chart Mr. Kathryn [name]. He tells me that he has not seen her in 2 weeks and had no idea she was in the hospital. He is not sure who the patient was staying with or who brought her to the ED.   History is obtained from my discussion earlier with Dr. Durwin Nora and from chart review.   NIHSS components Score: Comment  1a Level of Conscious 0[]  1[]  2[x]  3[]      1b LOC Questions 0[]  1[]  2[x]       1c LOC Commands 0[]  1[]  2[x]       2 Best Gaze 0[x]  1[]  2[]       3 Visual 0[x]  1[]  2[]  3[]      4 Facial Palsy 0[x]  1[]  2[]  3[]      5a Motor Arm - left 0[]  1[]  2[]  3[x]  4[]  UN[]    5b Motor Arm - Right 0[]  1[]  2[]  3[x]  4[]  UN[]    6a Motor Leg - Left 0[]  1[]  2[]  3[x]  4[]  UN[]    6b Motor Leg - Right 0[]  1[]  2[]   3[x]  4[]  UN[]    7 Limb Ataxia 0[x]  1[]  2[]  3[]  UN[]     8 Sensory 0[x]  1[]  2[]  UN[]      9 Best Language 0[]  1[]  2[]  3[x]      10 Dysarthria 0[]  1[]  2[x]  UN[]      11 Extinct. and Inattention 0[x]  1[]  2[]       TOTAL: 23      ROS  Unable to ascertain due to encephalopathy.  Past History  No past medical history on file.    Family History: No family history on file.  Social History  has no history on file for tobacco use, alcohol use, and drug use.  Not on File  Medications   Current Facility-Administered Medications:    heparin ADULT infusion 100 units/mL (25000 units/240mL), 900 Units/hr, Intravenous, Continuous, Rockwell Alexandria, RPH, Last Rate: 9 mL/hr at 05/06/23 2152, 900 Units/hr at 05/06/23 2152   lactated ringers infusion, , Intravenous, Continuous, Gloris Manchester, MD, Last Rate: 150 mL/hr at 05/06/23 2152, Rate Verify at 05/06/23 2152   thiamine (VITAMIN B1) 500 mg in sodium chloride 0.9 % 50 mL IVPB, 500 mg, Intravenous, Q8H **FOLLOWED BY** [START ON 05/09/2023] thiamine (VITAMIN B1) 250 mg in sodium chloride 0.9 % 50 mL IVPB,  250 mg, Intravenous, Daily **FOLLOWED BY** [START ON 05/15/2023] thiamine (VITAMIN B1) injection 100 mg, 100 mg, Intravenous, Daily, Erick Blinks, MD No current outpatient medications on file.  Vitals   Vitals:   26-May-2023 2032 26-May-2023 2045 May 26, 2023 2100 05/26/23 2153  BP: (!) 148/86 139/83 131/78 (!) 144/96  Pulse: 88 80 75 74  Resp: 16 14 13  (!) 24  Temp: 99.9 F (37.7 C)   97.8 F (36.6 C)  TempSrc: Rectal   Axillary  SpO2: 95% 96% 96% 97%  Weight:      Height:        Body mass index is 31.84 kg/m.  Physical Exam   General: Laying comfortably in bed; in no acute distress.  HENT: Normal oropharynx and mucosa. Normal external appearance of ears and nose.  Neck: Supple, no pain or tenderness  CV: No JVD. No peripheral edema.  Pulmonary: Symmetric Chest rise. Normal respiratory effort.  Abdomen: Soft to touch, non-tender.  Ext: No  cyanosis, edema, or deformity  Skin: No rash. Normal palpation of skin.   Musculoskeletal: Normal digits and nails by inspection. No clubbing.   Neurologic Examination  Mental status/Cognition: does not open eyes to voice or loud clap. Opens eyes briefly to vigorous tactile stimulation, does not answer any orientation questions. Speech/language: mutes for most of the encounter. To proximal pinch in all extremities, she does yell out "oh shit". Cranial nerves:   CN II Pupils equal and reactive to light, unable to assess for VF deficits.   CN III,IV,VI Unable to assess EOMI. No gaze preference or deviation.   CN V Corneals intact BL   CN VII Symmetric facial grimace.   CN VIII Does not make eye contact or open eyes to voice.   CN IX & X Gag intact. Protecting her airway at this time.   CN XI Turns head vigorously left to right with nares stimulation.   CN XII Unable to assess.   Motor:  Muscle bulk: normal, tone normal Unable to do detailed strength testing secondary to encephalopathy. Moves all extremities spontaneously and is tossing and turning in the bed.  Sensation:  Light touch    Pin prick Localizes to proximal pinch in all extremities.   Temperature    Vibration   Proprioception    Coordination/Complex Motor:  - Unable to assess but no obvious ataxia. - Gait: deferred for patient safett.  Labs/Imaging/Neurodiagnostic studies   CBC:  Recent Labs  Lab 2023/05/26 1602  HGB 14.6  HCT 43.0   Basic Metabolic Panel:  Lab Results  Component Value Date   NA 137 05/26/23   K 3.9 05-26-23   CO2 23 May 26, 2023   GLUCOSE 80 05-26-2023   BUN 16 2023/05/26   CREATININE 1.33 (H) 26-May-2023   CALCIUM 9.1 26-May-2023   GFRNONAA 48 (L) 05/26/2023   Lipid Panel: No results found for: "LDLCALC" HgbA1c: No results found for: "HGBA1C" Urine Drug Screen:     Component Value Date/Time   LABOPIA NONE DETECTED 05/26/2023 1741   COCAINSCRNUR POSITIVE (A) May 26, 2023 1741    LABBENZ NONE DETECTED May 26, 2023 1741   AMPHETMU NONE DETECTED 05-26-23 1741   THCU NONE DETECTED 2023-05-26 1741   LABBARB NONE DETECTED May 26, 2023 1741    Alcohol Level     Component Value Date/Time   ETH <10 2023/05/26 1605   INR No results found for: "INR" APTT No results found for: "APTT" AED levels: No results found for: "PHENYTOIN", "ZONISAMIDE", "LAMOTRIGINE", "LEVETIRACETA"  CT Head without contrast(Personally reviewed): CTH was  negative for a large hypodensity concerning for a large territory infarct or hyperdensity concerning for an ICH  CT angio Head and Neck with contrast(Personally reviewed): No LVO, large Aortic arch thrombus.  MRI Brain(Personally reviewed): Multifocal bilateral acute ischemia, greatest in the left parietal and occipital lobe, but also present in the posterior right hemisphere. No hemorrhage or mass effect.  Neurodiagnostics cEEG:  pending  ASSESSMENT   Chyrle Bobier is a 53 y.o. female with hx of hypertension, lumbar stenosis, schizoaffective disorder, bipolar type, and polysubstance abuse who was last seen at her baseline around 2300 on 05/05/23. Been asleep all day today so family brought her to the ED. workup in the ED is significant for bilateral multifocal acute ischemia, greatest in the left parietal and occipital lobe.  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  UDS positive for cocaine and labs with elevated creatinine concerning for AKI.  Her exam is notable for difficult to arouse and hard to say if she has aphasia out of proportion to encephalopathy.  The noted strokes, do not explain her obtunded mentation and so I suspect if she is having subclinical seizures or hypertensive encephalopathy or cocaine induced leukoencephalopathy.  RECOMMENDATIONS  - Heparin GTT, neuro scale without bolus dosing with goal heparin level between 0.3-0.5. - Continuous EEG overnight. - Keppra 20 mg/kg IV once while awaiting on continuous  EEG.  I do not see any obvious clinical seizure activity on my evaluation. - Ideally would prefer permissive hypertension.  However with the noted aortic arch findings, Dr. Julian Reil is reaching out to CTS to clarify goal blood pressure on their end. - Frequent Neuro checks per stroke unit protocol - Recommend obtaining TTE  - Recommend obtaining Lipid panel with LDL - Please start statin if LDL > 70 - Recommend HbA1c to evaluate for diabetes and how well it is controlled. - Recommend Telemetry monitoring for arrythmia - Recommend bedside swallow screen prior to PO intake. - Stroke education booklet - Recommend PT/OT/SLP consult - Stroke team to follow. - Thiamine levels, start Thiamine Wernicke's dosing. - B12, Folate, TSH, PT/INR and APTT.  ______________________________________________________________________    Welton Flakes, MD [facility]

## 2023-05-07 ENCOUNTER — Encounter (HOSPITAL_COMMUNITY): Payer: Self-pay | Admitting: Anesthesiology

## 2023-05-07 ENCOUNTER — Inpatient Hospital Stay (HOSPITAL_COMMUNITY): Payer: Medicare HMO

## 2023-05-07 ENCOUNTER — Telehealth: Payer: Self-pay | Admitting: Internal Medicine

## 2023-05-07 DIAGNOSIS — I639 Cerebral infarction, unspecified: Secondary | ICD-10-CM | POA: Diagnosis present

## 2023-05-07 DIAGNOSIS — F1410 Cocaine abuse, uncomplicated: Secondary | ICD-10-CM | POA: Diagnosis present

## 2023-05-07 DIAGNOSIS — G9340 Encephalopathy, unspecified: Secondary | ICD-10-CM | POA: Diagnosis present

## 2023-05-07 DIAGNOSIS — I7410 Embolism and thrombosis of unspecified parts of aorta: Secondary | ICD-10-CM | POA: Diagnosis not present

## 2023-05-07 DIAGNOSIS — R4182 Altered mental status, unspecified: Secondary | ICD-10-CM | POA: Diagnosis not present

## 2023-05-07 DIAGNOSIS — I6389 Other cerebral infarction: Secondary | ICD-10-CM

## 2023-05-07 DIAGNOSIS — I7419 Embolism and thrombosis of other parts of aorta: Secondary | ICD-10-CM | POA: Diagnosis not present

## 2023-05-07 DIAGNOSIS — J101 Influenza due to other identified influenza virus with other respiratory manifestations: Secondary | ICD-10-CM | POA: Diagnosis present

## 2023-05-07 DIAGNOSIS — M79671 Pain in right foot: Secondary | ICD-10-CM | POA: Diagnosis present

## 2023-05-07 DIAGNOSIS — Z1152 Encounter for screening for COVID-19: Secondary | ICD-10-CM | POA: Diagnosis not present

## 2023-05-07 DIAGNOSIS — T405X1A Poisoning by cocaine, accidental (unintentional), initial encounter: Secondary | ICD-10-CM | POA: Diagnosis present

## 2023-05-07 DIAGNOSIS — E538 Deficiency of other specified B group vitamins: Secondary | ICD-10-CM | POA: Diagnosis present

## 2023-05-07 DIAGNOSIS — G928 Other toxic encephalopathy: Secondary | ICD-10-CM | POA: Diagnosis present

## 2023-05-07 DIAGNOSIS — N179 Acute kidney failure, unspecified: Secondary | ICD-10-CM | POA: Diagnosis present

## 2023-05-07 DIAGNOSIS — M25561 Pain in right knee: Secondary | ICD-10-CM | POA: Diagnosis not present

## 2023-05-07 DIAGNOSIS — R414 Neurologic neglect syndrome: Secondary | ICD-10-CM | POA: Diagnosis present

## 2023-05-07 DIAGNOSIS — M7989 Other specified soft tissue disorders: Secondary | ICD-10-CM | POA: Diagnosis not present

## 2023-05-07 DIAGNOSIS — I6340 Cerebral infarction due to embolism of unspecified cerebral artery: Secondary | ICD-10-CM | POA: Diagnosis present

## 2023-05-07 DIAGNOSIS — F1020 Alcohol dependence, uncomplicated: Secondary | ICD-10-CM | POA: Diagnosis present

## 2023-05-07 DIAGNOSIS — F17210 Nicotine dependence, cigarettes, uncomplicated: Secondary | ICD-10-CM | POA: Diagnosis present

## 2023-05-07 DIAGNOSIS — I517 Cardiomegaly: Secondary | ICD-10-CM | POA: Diagnosis not present

## 2023-05-07 DIAGNOSIS — Z5900 Homelessness unspecified: Secondary | ICD-10-CM | POA: Diagnosis not present

## 2023-05-07 DIAGNOSIS — I10 Essential (primary) hypertension: Secondary | ICD-10-CM | POA: Diagnosis present

## 2023-05-07 DIAGNOSIS — R29723 NIHSS score 23: Secondary | ICD-10-CM | POA: Diagnosis present

## 2023-05-07 DIAGNOSIS — E669 Obesity, unspecified: Secondary | ICD-10-CM | POA: Diagnosis present

## 2023-05-07 DIAGNOSIS — F250 Schizoaffective disorder, bipolar type: Secondary | ICD-10-CM | POA: Diagnosis present

## 2023-05-07 DIAGNOSIS — N280 Ischemia and infarction of kidney: Secondary | ICD-10-CM | POA: Diagnosis present

## 2023-05-07 DIAGNOSIS — M1711 Unilateral primary osteoarthritis, right knee: Secondary | ICD-10-CM | POA: Diagnosis not present

## 2023-05-07 DIAGNOSIS — M25461 Effusion, right knee: Secondary | ICD-10-CM | POA: Diagnosis not present

## 2023-05-07 DIAGNOSIS — R531 Weakness: Secondary | ICD-10-CM | POA: Diagnosis present

## 2023-05-07 DIAGNOSIS — J4489 Other specified chronic obstructive pulmonary disease: Secondary | ICD-10-CM | POA: Diagnosis present

## 2023-05-07 DIAGNOSIS — G9349 Other encephalopathy: Secondary | ICD-10-CM | POA: Diagnosis present

## 2023-05-07 DIAGNOSIS — I2510 Atherosclerotic heart disease of native coronary artery without angina pectoris: Secondary | ICD-10-CM | POA: Diagnosis present

## 2023-05-07 DIAGNOSIS — R569 Unspecified convulsions: Secondary | ICD-10-CM | POA: Diagnosis not present

## 2023-05-07 DIAGNOSIS — E785 Hyperlipidemia, unspecified: Secondary | ICD-10-CM | POA: Diagnosis present

## 2023-05-07 LAB — CBC
HCT: 37.3 % (ref 36.0–46.0)
HCT: 42.9 % (ref 36.0–46.0)
Hemoglobin: 12.4 g/dL (ref 12.0–15.0)
Hemoglobin: 14.3 g/dL (ref 12.0–15.0)
MCH: 29.5 pg (ref 26.0–34.0)
MCH: 29.2 pg (ref 26.0–34.0)
MCHC: 33.2 g/dL (ref 30.0–36.0)
MCHC: 33.3 g/dL (ref 30.0–36.0)
MCV: 88.6 fL (ref 80.0–100.0)
MCV: 87.6 fL (ref 80.0–100.0)
Platelets: 381 10*3/uL (ref 150–400)
Platelets: 168 10*3/uL (ref 150–400)
RBC: 4.21 MIL/uL (ref 3.87–5.11)
RBC: 4.9 MIL/uL (ref 3.87–5.11)
RDW: 15.8 % — ABNORMAL HIGH (ref 11.5–15.5)
RDW: 15.9 % — ABNORMAL HIGH (ref 11.5–15.5)
WBC: 6.7 10*3/uL (ref 4.0–10.5)
WBC: 6.2 10*3/uL (ref 4.0–10.5)
nRBC: 0 % (ref 0.0–0.2)
nRBC: 0 % (ref 0.0–0.2)

## 2023-05-07 LAB — LIPID PANEL
Cholesterol: 157 mg/dL (ref 0–200)
HDL: 45 mg/dL (ref >40)
LDL Cholesterol: 91 mg/dL (ref 0–99)
Total CHOL/HDL Ratio: 3.5 {ratio}
Triglycerides: 104 mg/dL (ref <150)
VLDL: 21 mg/dL (ref 0–40)

## 2023-05-07 LAB — ECHOCARDIOGRAM COMPLETE
AR max vel: 1.82 cm2
AV Area VTI: 2.1 cm2
AV Area mean vel: 2 cm2
AV Mean grad: 5 mm[Hg]
AV Peak grad: 12 mm[Hg]
Ao pk vel: 1.73 m/s
Area-P 1/2: 3.66 cm2
Height: 62 in
P 1/2 time: 393 ms
S' Lateral: 2.7 cm
Weight: 2785.6 [oz_av]

## 2023-05-07 LAB — PROTIME-INR
INR: 1.2 (ref 0.8–1.2)
Prothrombin Time: 15.6 s — ABNORMAL HIGH (ref 11.4–15.2)

## 2023-05-07 LAB — HIV ANTIBODY (ROUTINE TESTING W REFLEX): HIV Screen 4th Generation wRfx: NONREACTIVE

## 2023-05-07 LAB — HEMOGLOBIN A1C
Hgb A1c MFr Bld: 5.7 % — ABNORMAL HIGH (ref 4.8–5.6)
Mean Plasma Glucose: 116.89 mg/dL

## 2023-05-07 LAB — HEPARIN LEVEL (UNFRACTIONATED)
Heparin Unfractionated: 0.14 [IU]/mL — ABNORMAL LOW (ref 0.30–0.70)
Heparin Unfractionated: 0.25 [IU]/mL — ABNORMAL LOW (ref 0.30–0.70)
Heparin Unfractionated: 0.5 [IU]/mL (ref 0.30–0.70)
Heparin Unfractionated: >1.1 [IU]/mL — ABNORMAL HIGH (ref 0.30–0.70)

## 2023-05-07 LAB — AMMONIA: Ammonia: 22 umol/L (ref 9–35)

## 2023-05-07 LAB — T4, FREE: Free T4: 0.85 ng/dL (ref 0.61–1.12)

## 2023-05-07 LAB — GLUCOSE, CAPILLARY: Glucose-Capillary: 84 mg/dL (ref 70–99)

## 2023-05-07 MED ORDER — LORAZEPAM 2 MG/ML IJ SOLN
0.5000 mg | INTRAMUSCULAR | Status: DC | PRN
  Administered 2023-05-07: 1 mg via INTRAVENOUS
  Filled 2023-05-07: qty 1

## 2023-05-07 MED ORDER — STROKE: EARLY STAGES OF RECOVERY BOOK
Freq: Once | Status: AC
Start: 2023-05-08 — End: 2023-05-08
  Filled 2023-05-07: qty 1

## 2023-05-07 MED ORDER — SODIUM CHLORIDE 0.9 % IV SOLN: INTRAVENOUS | Status: AC

## 2023-05-07 MED ORDER — ADULT MULTIVITAMIN W/MINERALS CH
1.0000 | ORAL_TABLET | Freq: Every day | ORAL | Status: DC
  Administered 2023-05-07 – 2023-05-18 (×12): 1 via ORAL
  Filled 2023-05-07 (×12): qty 1

## 2023-05-07 MED ORDER — CYANOCOBALAMIN 1000 MCG/ML IJ SOLN
1000.0000 ug | INTRAMUSCULAR | Status: DC
  Administered 2023-05-07 – 2023-05-14 (×2): 1000 ug via INTRAMUSCULAR
  Filled 2023-05-07 (×2): qty 1

## 2023-05-07 MED ORDER — OSELTAMIVIR PHOSPHATE 30 MG PO CAPS
30.0000 mg | ORAL_CAPSULE | Freq: Two times a day (BID) | ORAL | Status: DC
  Administered 2023-05-07 – 2023-05-09 (×5): 30 mg via ORAL
  Filled 2023-05-07 (×6): qty 1

## 2023-05-07 MED ORDER — LACTATED RINGERS IV SOLN: INTRAVENOUS | Status: DC

## 2023-05-07 MED ORDER — ATORVASTATIN CALCIUM 40 MG PO TABS
40.0000 mg | ORAL_TABLET | Freq: Every day | ORAL | Status: DC
  Administered 2023-05-08 – 2023-05-18 (×11): 40 mg via ORAL
  Filled 2023-05-07 (×9): qty 1
  Filled 2023-05-07: qty 4
  Filled 2023-05-07: qty 1

## 2023-05-07 MED ORDER — ATORVASTATIN CALCIUM 80 MG PO TABS
80.0000 mg | ORAL_TABLET | Freq: Every day | ORAL | Status: DC
  Administered 2023-05-07: 80 mg via ORAL
  Filled 2023-05-07: qty 2

## 2023-05-07 MED ORDER — FOLIC ACID 1 MG PO TABS
1.0000 mg | ORAL_TABLET | Freq: Every day | ORAL | Status: DC
  Administered 2023-05-07 – 2023-05-18 (×12): 1 mg via ORAL
  Filled 2023-05-07 (×12): qty 1

## 2023-05-07 MED ORDER — ACETAMINOPHEN 650 MG RE SUPP
650.0000 mg | RECTAL | Status: DC | PRN
Start: 2023-05-07 — End: 2023-05-16

## 2023-05-07 MED ORDER — ACETAMINOPHEN 325 MG PO TABS
650.0000 mg | ORAL_TABLET | ORAL | Status: DC | PRN
Start: 2023-05-07 — End: 2023-05-16
  Administered 2023-05-07 – 2023-05-15 (×9): 650 mg via ORAL
  Filled 2023-05-07 (×9): qty 2

## 2023-05-07 MED ORDER — ACETAMINOPHEN 160 MG/5ML PO SOLN
650.0000 mg | ORAL | Status: DC | PRN
Start: 2023-05-07 — End: 2023-05-16

## 2023-05-07 NOTE — Assessment & Plan Note (Signed)
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 Worrisome for underlying ulcer. No definite dissection flap seen on CTA today.  Discussed with Dr. Dorris Fetch (CTS) on phone: BP control Start life long AC as soon as okay with neurology Neuro ordered heparin drip already BP control with cardizem as needed (can't do BB due to cocaine on board). Going to try and keep SBP less than 160 for the moment.  Not allowing permissive HTN for stroke, but dont really have a hard indication to go for the less than 120 and HR of 60 or less that are typical goals in the medical management of aortic dissection at this time. Presumably needs formal CTS consult re: any surgical intervention to prevent further emboli? In AM.

## 2023-05-07 NOTE — Assessment & Plan Note (Addendum)
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 Stroke pathway A1C, FLP 2d echo Heparin gtt for thrombus Control BP / dont allow permissive HTN given thrombus source, increased association with thromboembolism from aortic thrombus with uncontrolled BP, and risk for development of aortic dissection with uncontrolled BP and presumably aortic ulcer underlying that thrombus. PT/OT/SLP - Though keeping pt bed rest for the moment until CTS can evaluate her / decide on risk of further embolism.

## 2023-05-07 NOTE — Progress Notes (Signed)
Echocardiogram 2D Echocardiogram has been performed.  Reinaldo Raddle Marvina Danner 05/07/2023, 11:33 AM

## 2023-05-07 NOTE — Consult Note (Signed)
[address]       [place] [address]             [phone]           Zer Gabor [facility] Medical Record #[id number] Date of Birth: 01-18-1971  No ref. 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 Pt has been transferred to [facility] and started on heparin. Pt continues to have issues with orientation and lethargy but has been noted to be improving neurologically. She was also just dx with Hflu. She lives with family at times and has been a chronic cocaine user along with mental health issues.       No past medical history on file.    Social History   Tobacco Use  Smoking Status Not on file  Smokeless Tobacco Not on file    Social History   Substance and Sexual Activity  Alcohol Use Not on file    Social History   Socioeconomic History   Marital status: Single    Spouse name: Not on file   Number of children: Not on file   Years of education: Not on file   Highest education level: Not on file  Occupational History   Not on file  Tobacco Use   Smoking status: Not on file   Smokeless tobacco: Not on file  Substance and Sexual Activity   Alcohol use: Not on file   Drug use: Not on file   Sexual activity: Not on file  Other Topics Concern   Not on file  Social History Narrative   Not on file   Social Drivers of Health   Financial Resource Strain: Not on file  Food Insecurity: Not on file  Transportation Needs: Not on file  Physical Activity: Not on file  Stress: Not on file  Social Connections: Not on file  Intimate Partner Violence: Not on file    Not on File  Current Facility-Administered Medications  Medication Dose Route Frequency Provider  Last Rate Last Admin   [START ON 05/08/2023]  stroke: early stages of recovery book   Does not apply Once Julian Reil, Jared M, DO       0.9 %  sodium chloride infusion   Intravenous Continuous Hillary Bow, DO 75 mL/hr at 05/07/23 0343 New Bag at 05/07/23 0343   acetaminophen (TYLENOL) tablet 650 mg  650 mg Oral Q4H PRN Hillary Bow, DO       Or   acetaminophen (TYLENOL) 160 MG/5ML solution 650 mg  650 mg Per Tube Q4H PRN Hillary Bow, DO       Or   acetaminophen (TYLENOL) suppository 650 mg  650 mg Rectal Q4H PRN Hillary Bow, DO       cyanocobalamin (VITAMIN B12) injection 1,000 mcg  1,000 mcg Intramuscular Q Lydia Guiles, Salman, MD       heparin ADULT infusion 100 units/mL (25000 units/27mL)  900 Units/hr Intravenous Continuous Rockwell Alexandria, RPH 9 mL/hr at 05/07/23 0926 900 Units/hr at 05/07/23 0926   LORazepam (ATIVAN) injection 0.5-1 mg  0.5-1 mg Intravenous Q4H PRN Hillary Bow, DO  1 mg at 05/07/23 0032   oseltamivir (TAMIFLU) capsule 30 mg  30 mg Oral BID Dorcas Carrow, MD   30 mg at 05/07/23 1610   thiamine (VITAMIN B1) 500 mg in sodium chloride 0.9 % 50 mL IVPB  500 mg Intravenous Q8H Erick Blinks, MD   Stopped at 05/07/23 0024   Followed by   Melene Muller ON 05/09/2023] thiamine (VITAMIN B1) 250 mg in sodium chloride 0.9 % 50 mL IVPB  250 mg Intravenous Daily Erick Blinks, MD       Followed by   Melene Muller ON 05/15/2023] thiamine (VITAMIN B1) injection 100 mg  100 mg Intravenous Daily Erick Blinks, MD       No current outpatient medications on file.     No family history on file.     Physical Exam: BP 134/77   Pulse 73   Temp (!) 97.5 F (36.4 C) (Oral)   Resp 16   Ht 5\' 2"  (1.575 m)   Wt 79 kg   SpO2 100%   BMI 31.84 kg/m      Diagnostic Studies & Laboratory data: I have personally reviewed the following studies and agree with the findings     Recent Radiology Findings:   CT Angio Chest/Abd/Pel for Dissection W and/or Wo  Contrast Result Date: 05/06/2023 CLINICAL DATA:  Acute aortic syndrome suspected based on findings on CTA head and neck. Altered mental status. EXAM: CT ANGIOGRAPHY CHEST, ABDOMEN AND PELVIS TECHNIQUE: Multidetector CT imaging through the chest, abdomen and pelvis was performed using the standard protocol during bolus administration of intravenous contrast. Multiplanar reconstructed images and MIPs were obtained and reviewed to evaluate the vascular anatomy. RADIATION DOSE REDUCTION: This exam was performed according to the departmental dose-optimization program which includes automated exposure control, adjustment of the mA and/or kV according to patient size and/or use of iterative reconstruction technique. CONTRAST:  80mL OMNIPAQUE IOHEXOL 350 MG/ML SOLN COMPARISON:  Same day CTA head and neck; chest radiograph 05/06/2023; CT chest abdomen pelvis without IV contrast 05/06/2023 FINDINGS: CTA CHEST FINDINGS Cardiovascular: Intraluminal filling defect in the aortic arch measuring 3.4 x 1.4 x 2.2 cm. Aortic root and ascending aorta are partially obscured by motion artifact. No definite dissection flap. No aneurysm. There are additional small filling defects in the descending aorta measuring 4 mm (5/44 and 5/76). The aortic arch branch vessels widely patent. Normal heart size. No pericardial effusion. No central pulmonary embolism. Mediastinum/Nodes: Bowing of the posterior trachea compatible with expiratory phase. Thyroid and esophagus are unremarkable. No thoracic adenopathy. Lungs/Pleura: Hypoventilation changes in the lungs. No focal consolidation, pleural effusion, or pneumothorax. Musculoskeletal: No acute fracture. Review of the MIP images confirms the above findings. CTA ABDOMEN AND PELVIS FINDINGS VASCULAR Aorta: Normal caliber abdominal aorta without dissection. Mild aortic atherosclerotic calcification. No intraluminal filling defects. Celiac: Patent without aneurysm or dissection. SMA: Patent without  aneurysm or dissection. Renals: Patent without aneurysm or dissection. IMA: Patent. Inflow: Patent without aneurysm or dissection. Veins: No obvious venous abnormality within the limitations of this arterial phase study. Review of the MIP images confirms the above findings. NON-VASCULAR Hepatobiliary: No acute abnormality. Pancreas: Unremarkable. Spleen: Unremarkable. Adrenals/Urinary Tract: Normal adrenal glands. Wedge-shaped filling defect in the inferior pole of the right kidney (series 9/image 109) is favored to represent a chronic scar though infarct could appear similarly. Stomach/Bowel: No bowel obstruction or bowel wall thickening. Stomach and appendix are within normal limits. Lymphatic: No lymphadenopathy. Reproductive: No acute abnormality. Other: No free intraperitoneal fluid or air. Musculoskeletal: No  acute fracture.  Posterior fusion L3-L5. Review of the MIP images confirms the above findings. IMPRESSION: 1. 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 Additional small filling defects in the descending aorta measuring 4 mm. No definite dissection flap. No aneurysm. 2. Wedge-shaped filling defect in the inferior pole of the right kidney is favored to represent a chronic scar though infarct could appear similarly. Aortic Atherosclerosis (ICD10-I70.0). Electronically Signed   By: Minerva Fester M.D.   On: 05/06/2023 20:35   MR BRAIN WO CONTRAST Result Date: 05/06/2023 CLINICAL DATA:  Altered mental status EXAM: MRI HEAD WITHOUT CONTRAST TECHNIQUE: Multiplanar, multiecho pulse sequences of the brain and surrounding structures were obtained without intravenous contrast. COMPARISON:  CTA head neck 05/06/2023 FINDINGS: Brain: There is multifocal bilateral acute ischemia, greatest in the left parietal and occipital lobe, but also present in the posterior right hemisphere. No acute or chronic hemorrhage. There is multifocal hyperintense T2-weighted signal within the white  matter. Generalized volume loss. The midline structures are normal. Vascular: Normal flow voids. Skull and upper cervical spine: Normal calvarium and skull base. Visualized upper cervical spine and soft tissues are normal. Sinuses/Orbits:Left mastoid effusion. Paranasal sinuses are clear. Normal orbits. IMPRESSION: Multifocal bilateral acute ischemia, greatest in the left parietal and occipital lobe, but also present in the posterior right hemisphere. No hemorrhage or mass effect. Electronically Signed   By: Deatra Robinson M.D.   On: 05/06/2023 20:25   CT ANGIO HEAD NECK W WO CM Result Date: 05/06/2023 CLINICAL DATA:  Acute neurologic deficit EXAM: CT ANGIOGRAPHY HEAD AND NECK WITH AND WITHOUT CONTRAST TECHNIQUE: Multidetector CT imaging of the head and neck was performed using the standard protocol during bolus administration of intravenous contrast. Multiplanar CT image reconstructions and MIPs were obtained to evaluate the vascular anatomy. Carotid stenosis measurements (when applicable) are obtained utilizing NASCET criteria, using the distal internal carotid diameter as the denominator. RADIATION DOSE REDUCTION: This exam was performed according to the departmental dose-optimization program which includes automated exposure control, adjustment of the mA and/or kV according to patient size and/or use of iterative reconstruction technique. CONTRAST:  75mL OMNIPAQUE IOHEXOL 350 MG/ML SOLN COMPARISON:  None Available. FINDINGS: CTA NECK FINDINGS Skeleton: No acute abnormality or high grade bony spinal canal stenosis. Other neck: Normal pharynx, larynx and major salivary glands. No cervical lymphadenopathy. Unremarkable thyroid gland. Upper chest: No pneumothorax or pleural effusion. No nodules or masses. Aortic arch: There is no calcific atherosclerosis of the aortic arch. There is a large intraluminal filling defect within the aortic arch, consistent with thromboembolism RIGHT carotid system: Normal without  aneurysm, dissection or stenosis. LEFT carotid system: Normal without aneurysm, dissection or stenosis. Vertebral arteries: Codominant configuration. There is no dissection, occlusion or flow-limiting stenosis to the skull base (V1-V3 segments). CTA HEAD FINDINGS POSTERIOR CIRCULATION: Vertebral arteries are normal. No proximal occlusion of the anterior or inferior cerebellar arteries. Basilar artery is normal. Superior cerebellar arteries are normal. Posterior cerebral arteries are normal. ANTERIOR CIRCULATION: Intracranial internal carotid arteries are normal. Anterior cerebral arteries are normal. Middle cerebral arteries are normal. Venous sinuses: As permitted by contrast timing, patent. Anatomic variants: Fetal origin of the right posterior cerebral artery. Review of the MIP images confirms the above findings. IMPRESSION: 1. Large intraluminal filling defect within the aortic arch, consistent with thromboembolism. An aortic CTA is recommended. 2. No emergent large vessel occlusion or high-grade stenosis of the intracranial arteries. Electronically Signed   By: Chrisandra Netters.D.  On: 05/06/2023 20:19   DG Chest Port 1 View Result Date: 05/06/2023 CLINICAL DATA:  Sepsis presentation.  Mental status changes. EXAM: PORTABLE CHEST 1 VIEW COMPARISON:  02/15/2023. FINDINGS: Allowing for the AP technique and poor inspiration, there is probably mild cardiomegaly. There may be mild pulmonary venous hypertension but there is no frank edema, infiltrate, collapse or effusion. IMPRESSION: Poor inspiration. Probable mild cardiomegaly and mild pulmonary venous hypertension. No frank edema, infiltrate or collapse. Electronically Signed   By: Paulina Fusi M.D.   On: 05/06/2023 17:34   CT CHEST ABDOMEN PELVIS WO CONTRAST Result Date: 05/06/2023 CLINICAL DATA:  Altered level of consciousness, sepsis EXAM: CT CHEST, ABDOMEN AND PELVIS WITHOUT CONTRAST TECHNIQUE: Multidetector CT imaging of the chest, abdomen and pelvis was  performed following the standard protocol without IV contrast. Examination was performed without contrast at the request of the ordering clinician. Evaluation of the soft tissues, solid viscera, and vascular structures is limited. RADIATION DOSE REDUCTION: This exam was performed according to the departmental dose-optimization program which includes automated exposure control, adjustment of the mA and/or kV according to patient size and/or use of iterative reconstruction technique. COMPARISON:  07/26/2022 FINDINGS: CT CHEST FINDINGS Cardiovascular: Unenhanced imaging of the heart demonstrates mild cardiomegaly without pericardial effusion. Normal caliber of the thoracic aorta. Evaluation of the vascular lumen is limited without IV contrast. Mediastinum/Nodes: No enlarged mediastinal, hilar, or axillary lymph nodes. Thyroid gland, trachea, and esophagus demonstrate no significant findings. Lungs/Pleura: No acute airspace disease, effusion, or pneumothorax. Central airways are patent. Musculoskeletal: No acute or destructive bony abnormalities. Reconstructed images demonstrate no additional findings. CT ABDOMEN PELVIS FINDINGS Hepatobiliary: Unremarkable unenhanced appearance of the liver and gallbladder. Pancreas: Unremarkable unenhanced appearance. Spleen: Unremarkable unenhanced appearance. Adrenals/Urinary Tract: No urinary tract calculi or obstructive uropathy. The adrenals and bladder are unremarkable. Stomach/Bowel: No bowel obstruction or ileus. Normal appendix right lower quadrant. No bowel wall thickening or inflammatory change. Vascular/Lymphatic: Aortic atherosclerosis. No enlarged abdominal or pelvic lymph nodes. Reproductive: Uterus is lobular and slightly enlarged consistent with underlying fibroids. No adnexal masses. Other: No free fluid or free intraperitoneal gas. No abdominal wall hernia. Musculoskeletal: No acute or destructive bony abnormalities. Postsurgical changes from L3 through L5.  Reconstructed images demonstrate no additional findings. IMPRESSION: 1. No acute intrathoracic, intra-abdominal, or intrapelvic process on this unenhanced exam. 2. Cardiomegaly. 3. Fibroid uterus. 4.  Aortic Atherosclerosis (ICD10-I70.0). Electronically Signed   By: Sharlet Salina M.D.   On: 05/06/2023 17:33   CT HEAD WO CONTRAST Result Date: 05/06/2023 CLINICAL DATA:  Altered level of consciousness EXAM: CT HEAD WITHOUT CONTRAST TECHNIQUE: Contiguous axial images were obtained from the base of the skull through the vertex without intravenous contrast. RADIATION DOSE REDUCTION: This exam was performed according to the departmental dose-optimization program which includes automated exposure control, adjustment of the mA and/or kV according to patient size and/or use of iterative reconstruction technique. COMPARISON:  12/11/2022 FINDINGS: Brain: No acute infarct or hemorrhage. Lateral ventricles and midline structures are unremarkable. No acute extra-axial fluid collections. No mass effect. Vascular: No hyperdense vessel or unexpected calcification. Skull: Normal. Negative for fracture or focal lesion. Sinuses/Orbits: No acute finding. Other: None. IMPRESSION: 1. No acute intracranial process. Electronically Signed   By: Sharlet Salina M.D.   On: 05/06/2023 17:28      Recent Lab Findings: Lab Results  Component Value Date   WBC 6.2 05/07/2023   HGB 14.3 05/07/2023   HCT 42.9 05/07/2023   PLT 168 05/07/2023   GLUCOSE 80 05/06/2023  ALT 20 05/06/2023   AST 35 05/06/2023   NA 137 05/06/2023   K 3.9 05/06/2023   CL 105 05/06/2023   CREATININE 1.33 (H) 05/06/2023   BUN 16 05/06/2023   CO2 23 05/06/2023   TSH 0.118 (L) 05/06/2023   INR 1.2 05/06/2023   HGBA1C 5.7 (H) 05/07/2023  EXAM Asleep but arousable.  Oriented to person and place. 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 Pt currently on heparin. Pt presents a challenge in that she is at risk for ongoing peripheral emboli on heparin therapy but also an increased risk with thrombectomy due to her recent CVA, recent flu, and physcosocial factors. Attempted to discuss the risks and recovery of surgery but at this point too disoriented for me to feel she is grasping the scope of surgery. Will allow time to have neurologic recovery and potentially perform thrombectomy under circ arrest on Wednesday. Will follow   I have spent 60 min in review of the records, viewing studies and in face to face with patient and in coordination of future care    Eugenio Hoes 05/07/2023 12:18 PM

## 2023-05-07 NOTE — Assessment & Plan Note (Addendum)
Pt also positive for Influenza A. Doesn't look like mental status will support taking POs at the moment so can't really order tamiflu. Supportive care Tylenol PRN fever No PNA being called on CT chest today.

## 2023-05-07 NOTE — ED Notes (Signed)
Pt starting pulling at cords and at IV lines. This RN tried to redirect pt but unsuccessful. Pt began grabbing and pinching this RN. MD notified.

## 2023-05-07 NOTE — Progress Notes (Signed)
SLP Cancellation Note  Patient Details Name: Kathryn [name] MRN: [id number] DOB: 11/14/1970   Cancelled treatment:       Reason Eval/Treat Not Completed: Fatigue/lethargy limiting ability to participate. Will continue following.   Gwynneth Aliment, M.A., CF-SLP Speech Language Pathology, Acute Rehabilitation Services  Secure Chat preferred [phone]  05/07/2023, 4:48 PM

## 2023-05-07 NOTE — Progress Notes (Signed)
LTM EEG hooked up and running - no initial skin breakdown - push button tested - [facility] NOT monitoring pt in ED.

## 2023-05-07 NOTE — ED Notes (Signed)
Blue top redrawn and sent to lab at this time.

## 2023-05-07 NOTE — Progress Notes (Signed)
PT Cancellation Note  Patient Details Name: Kathryn [name] MRN: [id number] DOB: November 21, 1970   Cancelled Treatment:    Reason Eval/Treat Not Completed: Active bedrest order  Noted to remain on bedrest until evaluated by CTS due to aortic thrombus. Will follow.    Jerolyn Center, PT Acute Rehabilitation Services  Office [phone]   Zena Amos 05/07/2023, 8:14 AM

## 2023-05-07 NOTE — ED Notes (Signed)
Unable to draw labs, pt resisting and won't allow me to hold her arm.  Nurse aware.

## 2023-05-07 NOTE — H&P (Addendum)
History and Physical    Patient: Kathryn [name]:[id number] DOB: [birth year] DOA: 05/06/2023 DOS: the patient was seen and examined on 05/07/2023 PCP: Patient, No Pcp Per  Patient coming from: Home  Chief Complaint:  Chief Complaint  Patient presents with   Altered Mental Status   HPI: Kathryn [name] is a 53 y.o. female with medical history significant of COPD, HTN, substance abuse.  Pt LKW 2300 on 1/18.  Pt sleeping all day today so family brought pt to ED.  MRI bain demonstrates embolic infarcts.  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  Pt initially quite lethargic, though eventually waking up more and agitated / confused.  Received ativan for sedation.  Seems to be protecting airway per neuro eval.    Review of Systems: unable to review all systems due to the inability of the patient to answer questions.  Past Medical History:  Diagnosis Date   Anxiety    Arthritis    Asthma    Chronic back pain    COPD (chronic obstructive pulmonary disease) (HCC)    Chronic bronchitis   Depression    Dyspnea    GERD (gastroesophageal reflux disease)    Hypertension    Migraine    Neuropathy    Osteoarthritis of left knee, 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   Single subsegmental pulmonary embolism without acute cor pulmonale (HCC) 06/20/2021   Sleep apnea 04/2020   GETTING A cpap   Suicidal ideation 12/24/2018    Past Surgical History:  Procedure Laterality Date   BACK SURGERY     DILATATION AND CURETTAGE/HYSTEROSCOPY WITH MINERVA N/A 06/09/2020   Procedure: DILATATION AND CURETTAGE/HYSTEROSCOPY WITH MINERVA;  Surgeon: Lazaro Arms, MD;  Location: [facility];  Service: Gynecology;  Laterality: N/A;   ESOPHAGOGASTRODUODENOSCOPY (EGD) WITH PROPOFOL N/A 12/25/2018   Procedure: ESOPHAGOGASTRODUODENOSCOPY (EGD) WITH PROPOFOL;  Surgeon: Malissa Hippo, MD;  Location: [facility];   Service: Endoscopy;  Laterality: N/A;   FLEXIBLE SIGMOIDOSCOPY  10/14/2021   Procedure: FLEXIBLE SIGMOIDOSCOPY;  Surgeon: Dolores Frame, MD;  Location: [facility];  Service: Gastroenterology;;   PATELLA-FEMORAL ARTHROPLASTY Left 10/08/2018   Procedure: PATELLA-FEMORAL ARTHROPLASTY;  Surgeon: Teryl Lucy, MD;  Location: [facility];  Service: Orthopedics;  Laterality: Left;   TUBAL LIGATION       reports that she has quit smoking. Her smoking use included cigarettes. She started smoking about 24 years ago. She has a 24.1 pack-year smoking history. She has never been exposed to tobacco smoke. She has never used smokeless tobacco. She reports that she does not currently use alcohol. She reports current drug use. Drug: Cocaine.  Allergies  Allergen Reactions   Fish Allergy Anaphylaxis, Shortness Of Breath and Swelling   Flexeril [Cyclobenzaprine Hcl] Shortness Of Breath   Ibuprofen Anaphylaxis, Hives, Other (See Comments), Cough and Rash   Shellfish Allergy Anaphylaxis   Tylenol [Acetaminophen] Anaphylaxis   Tramadol Nausea And Vomiting    Upset stomach   Ace Inhibitors Cough   Peanut Allergen Powder-Dnfp    Trazodone And Nefazodone Hives    Family History  Problem Relation Age of Onset   Gout Paternal Grandfather    Cirrhosis Paternal Grandfather    Hypertension Paternal Grandmother    Aneurysm Paternal Grandmother    Cirrhosis Maternal Grandmother    Cirrhosis Maternal Grandfather    Cancer Father    Cirrhosis Father    Cirrhosis Mother  Breast cancer Sister    Hypertension Sister    Bronchitis Daughter    Bronchitis Daughter    Asthma Son    Bronchitis Son    Migraines Neg Hx     Prior to Admission medications   Medication Sig Start Date End Date Taking? Authorizing Provider  albuterol (VENTOLIN HFA) 108 (90 Base) MCG/ACT inhaler Inhale 2 puffs into the lungs every 6 (six) hours as needed for wheezing or shortness of breath.    [provider]   amLODipine (NORVASC) 5 MG tablet Take 1 tablet (5 mg total) by mouth daily for 10 days. 03/08/23 03/18/23  Charm Rings, NP  DULoxetine (CYMBALTA) 30 MG capsule Take 3 capsules (90 mg total) by mouth daily. 03/30/23   Peterson Ao, MD  fluticasone furoate-vilanterol (BREO ELLIPTA) 200-25 MCG/ACT AEPB Inhale 1 puff into the lungs daily. 01/17/23   Benjiman Core, MD  gabapentin (NEURONTIN) 300 MG capsule Take 2 capsules (600 mg total) by mouth daily. 03/31/23   Peterson Ao, MD  lidocaine (LIDODERM) 5 % Place 1 patch onto the skin daily. Remove & Discard patch within 12 hours or as directed by MD 03/31/23   Peterson Ao, MD  nicotine (NICODERM CQ - DOSED IN MG/24 HOURS) 14 mg/24hr patch Place 1 patch (14 mg total) onto the skin daily. 03/31/23   Peterson Ao, MD  omeprazole (PRILOSEC) 20 MG capsule Take 20 mg by mouth daily before breakfast. 02/07/23   [provider]  QUEtiapine (SEROQUEL) 100 MG tablet Take 1 tablet (100 mg total) by mouth at bedtime. 03/30/23   Peterson Ao, MD    Physical Exam: Vitals:   05/07/23 0000 05/07/23 0030 05/07/23 0045 05/07/23 0100  BP: 101/73 133/82 115/79 108/78  Pulse: 76 74 74 72  Resp: 18 15 14 16   Temp:      TempSrc:      SpO2: 97% 98% 99% 99%  Weight:      Height:       Constitutional: Encephalopathic, now sedated after ativan Respiratory: clear to auscultation bilaterally, no wheezing, no crackles. Normal respiratory effort. No accessory muscle use.  Cardiovascular: Regular rate and rhythm, no murmurs / rubs / gallops. No extremity edema. 2+ pedal pulses. No carotid bruits.  Abdomen: no tenderness, no masses palpated. No hepatosplenomegaly. Bowel sounds positive.  Neurologic: Localizes to pinch, not answering questions / following commands, gag reflex present.  Not making eye contact  not really able to get much more of a neuro exam due to encephalopathy  Reportedly waking up earlier and grabbing at things / tele leads,  confused.  Data Reviewed:    Labs on Admission: I have personally reviewed following labs and imaging studies  CBC: Recent Labs  Lab 05/06/23 1602  HGB 14.6  HCT 43.0   Basic Metabolic Panel: Recent Labs  Lab 05/06/23 1602 05/06/23 1605  NA 140 137  K 4.4 3.9  CL 106 105  CO2  --  23  GLUCOSE 84 80  BUN 19 16  CREATININE 1.30* 1.33*  CALCIUM  --  9.1  MG  --  2.3   GFR: Estimated Creatinine Clearance: 47.1 mL/min (A) (by C-G formula based on SCr of 1.33 mg/dL (H)). Liver Function Tests: Recent Labs  Lab 05/06/23 1605  AST 35  ALT 20  ALKPHOS 88  BILITOT 0.5  PROT 8.4*  ALBUMIN 3.9   No results for input(s): "LIPASE", "AMYLASE" in the last 168 hours. Recent Labs  Lab 05/06/23 1605  AMMONIA 45*  Coagulation Profile: Recent Labs  Lab 05/06/23 2347  INR 1.2   Cardiac Enzymes: No results for input(s): "CKTOTAL", "CKMB", "CKMBINDEX", "TROPONINI" in the last 168 hours. BNP (last 3 results) No results for input(s): "PROBNP" in the last 8760 hours. HbA1C: No results for input(s): "HGBA1C" in the last 72 hours. CBG: Recent Labs  Lab 05/06/23 1540  GLUCAP 71   Lipid Profile: No results for input(s): "CHOL", "HDL", "LDLCALC", "TRIG", "CHOLHDL", "LDLDIRECT" in the last 72 hours. Thyroid Function Tests: Recent Labs    05/06/23 2213 05/06/23 2347  TSH 0.118*  --   FREET4  --  0.85   Anemia Panel: Recent Labs    05/06/23 2213  VITAMINB12 260  FOLATE 17.2   Urine analysis:    Component Value Date/Time   COLORURINE YELLOW 05/06/2023 1741   APPEARANCEUR CLEAR 05/06/2023 1741   LABSPEC 1.019 05/06/2023 1741   PHURINE 6.0 05/06/2023 1741   GLUCOSEU NEGATIVE 05/06/2023 1741   HGBUR SMALL (A) 05/06/2023 1741   BILIRUBINUR NEGATIVE 05/06/2023 1741   KETONESUR NEGATIVE 05/06/2023 1741   PROTEINUR 30 (A) 05/06/2023 1741   NITRITE NEGATIVE 05/06/2023 1741   LEUKOCYTESUR NEGATIVE 05/06/2023 1741    Radiological Exams on Admission: CT Angio  Chest/Abd/Pel for Dissection W and/or Wo Contrast Result Date: 05/06/2023 CLINICAL DATA:  Acute aortic syndrome suspected based on findings on CTA head and neck. Altered mental status. EXAM: CT ANGIOGRAPHY CHEST, ABDOMEN AND PELVIS TECHNIQUE: Multidetector CT imaging through the chest, abdomen and pelvis was performed using the standard protocol during bolus administration of intravenous contrast. Multiplanar reconstructed images and MIPs were obtained and reviewed to evaluate the vascular anatomy. RADIATION DOSE REDUCTION: This exam was performed according to the departmental dose-optimization program which includes automated exposure control, adjustment of the mA and/or kV according to patient size and/or use of iterative reconstruction technique. CONTRAST:  80mL OMNIPAQUE IOHEXOL 350 MG/ML SOLN COMPARISON:  Same day CTA head and neck; chest radiograph 05/06/2023; CT chest abdomen pelvis without IV contrast 05/06/2023 FINDINGS: CTA CHEST FINDINGS Cardiovascular: Intraluminal filling defect in the aortic arch measuring 3.4 x 1.4 x 2.2 cm. Aortic root and ascending aorta are partially obscured by motion artifact. No definite dissection flap. No aneurysm. There are additional small filling defects in the descending aorta measuring 4 mm (5/44 and 5/76). The aortic arch branch vessels widely patent. Normal heart size. No pericardial effusion. No central pulmonary embolism. Mediastinum/Nodes: Bowing of the posterior trachea compatible with expiratory phase. Thyroid and esophagus are unremarkable. No thoracic adenopathy. Lungs/Pleura: Hypoventilation changes in the lungs. No focal consolidation, pleural effusion, or pneumothorax. Musculoskeletal: No acute fracture. Review of the MIP images confirms the above findings. CTA ABDOMEN AND PELVIS FINDINGS VASCULAR Aorta: Normal caliber abdominal aorta without dissection. Mild aortic atherosclerotic calcification. No intraluminal filling defects. Celiac: Patent without  aneurysm or dissection. SMA: Patent without aneurysm or dissection. Renals: Patent without aneurysm or dissection. IMA: Patent. Inflow: Patent without aneurysm or dissection. Veins: No obvious venous abnormality within the limitations of this arterial phase study. Review of the MIP images confirms the above findings. NON-VASCULAR Hepatobiliary: No acute abnormality. Pancreas: Unremarkable. Spleen: Unremarkable. Adrenals/Urinary Tract: Normal adrenal glands. Wedge-shaped filling defect in the inferior pole of the right kidney (series 9/image 109) is favored to represent a chronic scar though infarct could appear similarly. Stomach/Bowel: No bowel obstruction or bowel wall thickening. Stomach and appendix are within normal limits. Lymphatic: No lymphadenopathy. Reproductive: No acute abnormality. Other: No free intraperitoneal fluid or air. Musculoskeletal: No acute fracture.  Posterior fusion L3-L5. Review of the MIP images confirms the above findings. IMPRESSION: 1. 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 Additional small filling defects in the descending aorta measuring 4 mm. No definite dissection flap. No aneurysm. 2. Wedge-shaped filling defect in the inferior pole of the right kidney is favored to represent a chronic scar though infarct could appear similarly. Aortic Atherosclerosis (ICD10-I70.0). Electronically Signed   By: Minerva Fester M.D.   On: 05/06/2023 20:35   MR BRAIN WO CONTRAST Result Date: 05/06/2023 CLINICAL DATA:  Altered mental status EXAM: MRI HEAD WITHOUT CONTRAST TECHNIQUE: Multiplanar, multiecho pulse sequences of the brain and surrounding structures were obtained without intravenous contrast. COMPARISON:  CTA head neck 05/06/2023 FINDINGS: Brain: There is multifocal bilateral acute ischemia, greatest in the left parietal and occipital lobe, but also present in the posterior right hemisphere. No acute or chronic hemorrhage. There is multifocal  hyperintense T2-weighted signal within the white matter. Generalized volume loss. The midline structures are normal. Vascular: Normal flow voids. Skull and upper cervical spine: Normal calvarium and skull base. Visualized upper cervical spine and soft tissues are normal. Sinuses/Orbits:Left mastoid effusion. Paranasal sinuses are clear. Normal orbits. IMPRESSION: Multifocal bilateral acute ischemia, greatest in the left parietal and occipital lobe, but also present in the posterior right hemisphere. No hemorrhage or mass effect. Electronically Signed   By: Deatra Robinson M.D.   On: 05/06/2023 20:25   CT ANGIO HEAD NECK W WO CM Result Date: 05/06/2023 CLINICAL DATA:  Acute neurologic deficit EXAM: CT ANGIOGRAPHY HEAD AND NECK WITH AND WITHOUT CONTRAST TECHNIQUE: Multidetector CT imaging of the head and neck was performed using the standard protocol during bolus administration of intravenous contrast. Multiplanar CT image reconstructions and MIPs were obtained to evaluate the vascular anatomy. Carotid stenosis measurements (when applicable) are obtained utilizing NASCET criteria, using the distal internal carotid diameter as the denominator. RADIATION DOSE REDUCTION: This exam was performed according to the departmental dose-optimization program which includes automated exposure control, adjustment of the mA and/or kV according to patient size and/or use of iterative reconstruction technique. CONTRAST:  75mL OMNIPAQUE IOHEXOL 350 MG/ML SOLN COMPARISON:  None Available. FINDINGS: CTA NECK FINDINGS Skeleton: No acute abnormality or high grade bony spinal canal stenosis. Other neck: Normal pharynx, larynx and major salivary glands. No cervical lymphadenopathy. Unremarkable thyroid gland. Upper chest: No pneumothorax or pleural effusion. No nodules or masses. Aortic arch: There is no calcific atherosclerosis of the aortic arch. There is a large intraluminal filling defect within the aortic arch, consistent with  thromboembolism RIGHT carotid system: Normal without aneurysm, dissection or stenosis. LEFT carotid system: Normal without aneurysm, dissection or stenosis. Vertebral arteries: Codominant configuration. There is no dissection, occlusion or flow-limiting stenosis to the skull base (V1-V3 segments). CTA HEAD FINDINGS POSTERIOR CIRCULATION: Vertebral arteries are normal. No proximal occlusion of the anterior or inferior cerebellar arteries. Basilar artery is normal. Superior cerebellar arteries are normal. Posterior cerebral arteries are normal. ANTERIOR CIRCULATION: Intracranial internal carotid arteries are normal. Anterior cerebral arteries are normal. Middle cerebral arteries are normal. Venous sinuses: As permitted by contrast timing, patent. Anatomic variants: Fetal origin of the right posterior cerebral artery. Review of the MIP images confirms the above findings. IMPRESSION: 1. Large intraluminal filling defect within the aortic arch, consistent with thromboembolism. An aortic CTA is recommended. 2. No emergent large vessel occlusion or high-grade stenosis of the intracranial arteries. Electronically Signed   By: Deatra Robinson M.D.   On: 05/06/2023 20:19  DG Chest Port 1 View Result Date: 05/06/2023 CLINICAL DATA:  Sepsis presentation.  Mental status changes. EXAM: PORTABLE CHEST 1 VIEW COMPARISON:  02/15/2023. FINDINGS: Allowing for the AP technique and poor inspiration, there is probably mild cardiomegaly. There may be mild pulmonary venous hypertension but there is no frank edema, infiltrate, collapse or effusion. IMPRESSION: Poor inspiration. Probable mild cardiomegaly and mild pulmonary venous hypertension. No frank edema, infiltrate or collapse. Electronically Signed   By: Paulina Fusi M.D.   On: 05/06/2023 17:34   CT CHEST ABDOMEN PELVIS WO CONTRAST Result Date: 05/06/2023 CLINICAL DATA:  Altered level of consciousness, sepsis EXAM: CT CHEST, ABDOMEN AND PELVIS WITHOUT CONTRAST TECHNIQUE:  Multidetector CT imaging of the chest, abdomen and pelvis was performed following the standard protocol without IV contrast. Examination was performed without contrast at the request of the ordering clinician. Evaluation of the soft tissues, solid viscera, and vascular structures is limited. RADIATION DOSE REDUCTION: This exam was performed according to the departmental dose-optimization program which includes automated exposure control, adjustment of the mA and/or kV according to patient size and/or use of iterative reconstruction technique. COMPARISON:  07/26/2022 FINDINGS: CT CHEST FINDINGS Cardiovascular: Unenhanced imaging of the heart demonstrates mild cardiomegaly without pericardial effusion. Normal caliber of the thoracic aorta. Evaluation of the vascular lumen is limited without IV contrast. Mediastinum/Nodes: No enlarged mediastinal, hilar, or axillary lymph nodes. Thyroid gland, trachea, and esophagus demonstrate no significant findings. Lungs/Pleura: No acute airspace disease, effusion, or pneumothorax. Central airways are patent. Musculoskeletal: No acute or destructive bony abnormalities. Reconstructed images demonstrate no additional findings. CT ABDOMEN PELVIS FINDINGS Hepatobiliary: Unremarkable unenhanced appearance of the liver and gallbladder. Pancreas: Unremarkable unenhanced appearance. Spleen: Unremarkable unenhanced appearance. Adrenals/Urinary Tract: No urinary tract calculi or obstructive uropathy. The adrenals and bladder are unremarkable. Stomach/Bowel: No bowel obstruction or ileus. Normal appendix right lower quadrant. No bowel wall thickening or inflammatory change. Vascular/Lymphatic: Aortic atherosclerosis. No enlarged abdominal or pelvic lymph nodes. Reproductive: Uterus is lobular and slightly enlarged consistent with underlying fibroids. No adnexal masses. Other: No free fluid or free intraperitoneal gas. No abdominal wall hernia. Musculoskeletal: No acute or destructive bony  abnormalities. Postsurgical changes from L3 through L5. Reconstructed images demonstrate no additional findings. IMPRESSION: 1. No acute intrathoracic, intra-abdominal, or intrapelvic process on this unenhanced exam. 2. Cardiomegaly. 3. Fibroid uterus. 4.  Aortic Atherosclerosis (ICD10-I70.0). Electronically Signed   By: Sharlet Salina M.D.   On: 05/06/2023 17:33   CT HEAD WO CONTRAST Result Date: 05/06/2023 CLINICAL DATA:  Altered level of consciousness EXAM: CT HEAD WITHOUT CONTRAST TECHNIQUE: Contiguous axial images were obtained from the base of the skull through the vertex without intravenous contrast. RADIATION DOSE REDUCTION: This exam was performed according to the departmental dose-optimization program which includes automated exposure control, adjustment of the mA and/or kV according to patient size and/or use of iterative reconstruction technique. COMPARISON:  12/11/2022 FINDINGS: Brain: No acute infarct or hemorrhage. Lateral ventricles and midline structures are unremarkable. No acute extra-axial fluid collections. No mass effect. Vascular: No hyperdense vessel or unexpected calcification. Skull: Normal. Negative for fracture or focal lesion. Sinuses/Orbits: No acute finding. Other: None. IMPRESSION: 1. No acute intracranial process. Electronically Signed   By: Sharlet Salina M.D.   On: 05/06/2023 17:28    EKG: Independently reviewed.   Assessment and Plan: * Acute embolic stroke (HCC) Pt with acute embolic strokes from an aortic arch. Stroke pathway A1C, FLP 2d echo Heparin gtt for thrombus Control BP / dont allow permissive HTN given thrombus  source, increased association with thromboembolism from aortic thrombus with uncontrolled BP, and risk for development of aortic dissection with uncontrolled BP and presumably aortic ulcer underlying that thrombus. PT/OT/SLP - Though keeping pt bed rest for the moment until CTS can evaluate her / decide on risk of further embolism.  Acute  encephalopathy AMS beyond what would be expected from the identified strokes on MRI.  Cause not yet clear: See neuro consult note NPO given mental status Ativan PRN agitation Empiric thiamine TSH low, but T4 of 0.85 is actually WNL (low end of normal) not really consistent with hyperthyroidism driving her AMS. Ammonia slightly elevated at 45 UA neg CT of head and torso not really impressive for infectious source. VBG neg for acidosis or hypercapnea Other possible causes might include: Cocaine withdrawal Cocaine leukoencephalopathy seizures  Aortic thrombus (HCC) Source of embolic strokes and what's likely an embolic renal infarct seen on CT. Worrisome for underlying ulcer. No definite dissection flap seen on CTA today.  Discussed with Dr. Dorris Fetch (CTS) on phone: BP control Start life long AC as soon as okay with neurology Neuro ordered heparin drip already BP control with cardizem as needed (can't do BB due to cocaine on board). Going to try and keep SBP less than 160 for the moment.  Not allowing permissive HTN for stroke, but dont really have a hard indication to go for the less than 120 and HR of 60 or less that are typical goals in the medical management of aortic dissection at this time. Presumably needs formal CTS consult re: any surgical intervention to prevent further emboli? In AM.  Influenza A Pt also positive for Influenza A. Doesn't look like mental status will support taking POs at the moment so can't really order tamiflu. Supportive care Tylenol PRN fever No PNA being called on CT chest today.  Cocaine abuse (HCC) Cocaine positive UDS today. Suspect this is significantly contributing to underlying HTN and aortic ulcer / thrombus.      Advance Care Planning:   Code Status: Full Code  Consults: Neuro  Family Communication: Tried contacting daughter at number [phone] provided to me by RN, but went to voicemail, couldn't leave message due to  HIPAA.  Other number [phone] not in service  Severity of Illness: The appropriate patient status for this patient is INPATIENT. Inpatient status is judged to be reasonable and necessary in order to provide the required intensity of service to ensure the patient's safety. The patient's presenting symptoms, physical exam findings, and initial radiographic and laboratory data in the context of their chronic comorbidities is felt to place them at high risk for further clinical deterioration. Furthermore, it is not anticipated that the patient will be medically stable for discharge from the hospital within 2 midnights of admission.   * I certify that at the point of admission it is my clinical judgment that the patient will require inpatient hospital care spanning beyond 2 midnights from the point of admission due to high intensity of service, high risk for further deterioration and high frequency of surveillance required.*  Author: Hillary Bow., DO 05/07/2023 1:32 AM  For on call review [email or web address].

## 2023-05-07 NOTE — Progress Notes (Signed)
OT Cancellation Note  Patient Details Name: Christinemarie Rouch MRN: [id number] DOB: [birth year]   Cancelled Treatment:    Reason Eval/Treat Not Completed: Active bedrest order.  OT to continue efforts as appropriate.    Noretta Frier D Juniel Groene 05/07/2023, 12:46 PM 05/07/2023  RP, OTR/L  Acute Rehabilitation Services  Office:  [phone]

## 2023-05-07 NOTE — Progress Notes (Addendum)
STROKE TEAM PROGRESS NOTE   BRIEF HPI [name] is a 53 y.o. female presented to the hospital by family because she had been asleep all day. MRI Brain shows embolic appearing infarcts, largest in left parietal lobe.   NIH on Admission 23   SIGNIFICANT HOSPITAL EVENTS 1/19-MRI Brain shows embolic appearing infarcts, largest in left parietal lobe.  CT head with no acute infarct.  CTA with no LVO however large Aortic arch thrombus   INTERIM HISTORY/SUBJECTIVE Family is at the bedside.  She has just been placed on long-term EEG MRI Brain shows embolic appearing infarcts, largest in left parietal lobe.  CT head with no acute infarct.  CTA with no LVO however large Aortic arch thrombus.  She is on IV heparin  UDS positive for cocaine.  She also endorses drinking 440 ounces of beer daily and 1/5 of tequila daily.  She also smokes about 5 cigarettes/day B12-260 Folate 17.2 TSH 0.118 Ammonia 45->22 A1c 5.7 LDL 91  On exam she is awake and alert, confused able to state her name and month and age, year she stated 2005 and then corrected herself to 2025.  Unable to state correct place.  She follows commands.  Eyes are midline and tracks.  Visual fields are full face is symmetric, right arm with drift weak right grip compared to left grip and right leg with drift-can slightly wiggle right toes, decrease sensation on right side with right side neglect  OBJECTIVE  CBC    Component Value Date/Time   WBC 6.2 05/07/2023 1024   RBC 4.90 05/07/2023 1024   HGB 14.3 05/07/2023 1024   HCT 42.9 05/07/2023 1024   PLT 168 05/07/2023 1024   MCV 87.6 05/07/2023 1024   MCH 29.2 05/07/2023 1024   MCHC 33.3 05/07/2023 1024   RDW 15.9 (H) 05/07/2023 1024    BMET    Component Value Date/Time   NA 137 05/06/2023 1605   K 3.9 05/06/2023 1605   CL 105 05/06/2023 1605   CO2 23 05/06/2023 1605   GLUCOSE 80 05/06/2023 1605   BUN 16 05/06/2023 1605   CREATININE 1.33 (H) 05/06/2023 1605   CALCIUM  9.1 05/06/2023 1605   GFRNONAA 48 (L) 05/06/2023 1605    IMAGING past 24 hours CT Angio Chest/Abd/Pel for Dissection W and/or Wo Contrast Result Date: 05/06/2023 CLINICAL DATA:  Acute aortic syndrome suspected based on findings on CTA head and neck. Altered mental status. EXAM: CT ANGIOGRAPHY CHEST, ABDOMEN AND PELVIS TECHNIQUE: Multidetector CT imaging through the chest, abdomen and pelvis was performed using the standard protocol during bolus administration of intravenous contrast. Multiplanar reconstructed images and MIPs were obtained and reviewed to evaluate the vascular anatomy. RADIATION DOSE REDUCTION: This exam was performed according to the departmental dose-optimization program which includes automated exposure control, adjustment of the mA and/or kV according to patient size and/or use of iterative reconstruction technique. CONTRAST:  80mL OMNIPAQUE IOHEXOL 350 MG/ML SOLN COMPARISON:  Same day CTA head and neck; chest radiograph 05/06/2023; CT chest abdomen pelvis without IV contrast 05/06/2023 FINDINGS: CTA CHEST FINDINGS Cardiovascular: Intraluminal filling defect in the aortic arch measuring 3.4 x 1.4 x 2.2 cm. Aortic root and ascending aorta are partially obscured by motion artifact. No definite dissection flap. No aneurysm. There are additional small filling defects in the descending aorta measuring 4 mm (5/44 and 5/76). The aortic arch branch vessels widely patent. Normal heart size. No pericardial effusion. No central pulmonary embolism. Mediastinum/Nodes: Bowing of the posterior trachea compatible with  expiratory phase. Thyroid and esophagus are unremarkable. No thoracic adenopathy. Lungs/Pleura: Hypoventilation changes in the lungs. No focal consolidation, pleural effusion, or pneumothorax. Musculoskeletal: No acute fracture. Review of the MIP images confirms the above findings. CTA ABDOMEN AND PELVIS FINDINGS VASCULAR Aorta: Normal caliber abdominal aorta without dissection. Mild aortic  atherosclerotic calcification. No intraluminal filling defects. Celiac: Patent without aneurysm or dissection. SMA: Patent without aneurysm or dissection. Renals: Patent without aneurysm or dissection. IMA: Patent. Inflow: Patent without aneurysm or dissection. Veins: No obvious venous abnormality within the limitations of this arterial phase study. Review of the MIP images confirms the above findings. NON-VASCULAR Hepatobiliary: No acute abnormality. Pancreas: Unremarkable. Spleen: Unremarkable. Adrenals/Urinary Tract: Normal adrenal glands. Wedge-shaped filling defect in the inferior pole of the right kidney (series 9/image 109) is favored to represent a chronic scar though infarct could appear similarly. Stomach/Bowel: No bowel obstruction or bowel wall thickening. Stomach and appendix are within normal limits. Lymphatic: No lymphadenopathy. Reproductive: No acute abnormality. Other: No free intraperitoneal fluid or air. Musculoskeletal: No acute fracture.  Posterior fusion L3-L5. Review of the MIP images confirms the above findings. IMPRESSION: 1. 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 Additional small filling defects in the descending aorta measuring 4 mm. No definite dissection flap. No aneurysm. 2. Wedge-shaped filling defect in the inferior pole of the right kidney is favored to represent a chronic scar though infarct could appear similarly. Aortic Atherosclerosis (ICD10-I70.0). Electronically Signed   By: Minerva Fester M.D.   On: 05/06/2023 20:35   MR BRAIN WO CONTRAST Result Date: 05/06/2023 CLINICAL DATA:  Altered mental status EXAM: MRI HEAD WITHOUT CONTRAST TECHNIQUE: Multiplanar, multiecho pulse sequences of the brain and surrounding structures were obtained without intravenous contrast. COMPARISON:  CTA head neck 05/06/2023 FINDINGS: Brain: There is multifocal bilateral acute ischemia, greatest in the left parietal and occipital lobe, but also present in the  posterior right hemisphere. No acute or chronic hemorrhage. There is multifocal hyperintense T2-weighted signal within the white matter. Generalized volume loss. The midline structures are normal. Vascular: Normal flow voids. Skull and upper cervical spine: Normal calvarium and skull base. Visualized upper cervical spine and soft tissues are normal. Sinuses/Orbits:Left mastoid effusion. Paranasal sinuses are clear. Normal orbits. IMPRESSION: Multifocal bilateral acute ischemia, greatest in the left parietal and occipital lobe, but also present in the posterior right hemisphere. No hemorrhage or mass effect. Electronically Signed   By: Deatra Robinson M.D.   On: 05/06/2023 20:25   CT ANGIO HEAD NECK W WO CM Result Date: 05/06/2023 CLINICAL DATA:  Acute neurologic deficit EXAM: CT ANGIOGRAPHY HEAD AND NECK WITH AND WITHOUT CONTRAST TECHNIQUE: Multidetector CT imaging of the head and neck was performed using the standard protocol during bolus administration of intravenous contrast. Multiplanar CT image reconstructions and MIPs were obtained to evaluate the vascular anatomy. Carotid stenosis measurements (when applicable) are obtained utilizing NASCET criteria, using the distal internal carotid diameter as the denominator. RADIATION DOSE REDUCTION: This exam was performed according to the departmental dose-optimization program which includes automated exposure control, adjustment of the mA and/or kV according to patient size and/or use of iterative reconstruction technique. CONTRAST:  75mL OMNIPAQUE IOHEXOL 350 MG/ML SOLN COMPARISON:  None Available. FINDINGS: CTA NECK FINDINGS Skeleton: No acute abnormality or high grade bony spinal canal stenosis. Other neck: Normal pharynx, larynx and major salivary glands. No cervical lymphadenopathy. Unremarkable thyroid gland. Upper chest: No pneumothorax or pleural effusion. No nodules or masses. Aortic arch: There is no  calcific atherosclerosis of the aortic arch. There is a  large intraluminal filling defect within the aortic arch, consistent with thromboembolism RIGHT carotid system: Normal without aneurysm, dissection or stenosis. LEFT carotid system: Normal without aneurysm, dissection or stenosis. Vertebral arteries: Codominant configuration. There is no dissection, occlusion or flow-limiting stenosis to the skull base (V1-V3 segments). CTA HEAD FINDINGS POSTERIOR CIRCULATION: Vertebral arteries are normal. No proximal occlusion of the anterior or inferior cerebellar arteries. Basilar artery is normal. Superior cerebellar arteries are normal. Posterior cerebral arteries are normal. ANTERIOR CIRCULATION: Intracranial internal carotid arteries are normal. Anterior cerebral arteries are normal. Middle cerebral arteries are normal. Venous sinuses: As permitted by contrast timing, patent. Anatomic variants: Fetal origin of the right posterior cerebral artery. Review of the MIP images confirms the above findings. IMPRESSION: 1. Large intraluminal filling defect within the aortic arch, consistent with thromboembolism. An aortic CTA is recommended. 2. No emergent large vessel occlusion or high-grade stenosis of the intracranial arteries. Electronically Signed   By: Deatra Robinson M.D.   On: 05/06/2023 20:19   DG Chest Port 1 View Result Date: 05/06/2023 CLINICAL DATA:  Sepsis presentation.  Mental status changes. EXAM: PORTABLE CHEST 1 VIEW COMPARISON:  02/15/2023. FINDINGS: Allowing for the AP technique and poor inspiration, there is probably mild cardiomegaly. There may be mild pulmonary venous hypertension but there is no frank edema, infiltrate, collapse or effusion. IMPRESSION: Poor inspiration. Probable mild cardiomegaly and mild pulmonary venous hypertension. No frank edema, infiltrate or collapse. Electronically Signed   By: Paulina Fusi M.D.   On: 05/06/2023 17:34   CT CHEST ABDOMEN PELVIS WO CONTRAST Result Date: 05/06/2023 CLINICAL DATA:  Altered level of consciousness,  sepsis EXAM: CT CHEST, ABDOMEN AND PELVIS WITHOUT CONTRAST TECHNIQUE: Multidetector CT imaging of the chest, abdomen and pelvis was performed following the standard protocol without IV contrast. Examination was performed without contrast at the request of the ordering clinician. Evaluation of the soft tissues, solid viscera, and vascular structures is limited. RADIATION DOSE REDUCTION: This exam was performed according to the departmental dose-optimization program which includes automated exposure control, adjustment of the mA and/or kV according to patient size and/or use of iterative reconstruction technique. COMPARISON:  07/26/2022 FINDINGS: CT CHEST FINDINGS Cardiovascular: Unenhanced imaging of the heart demonstrates mild cardiomegaly without pericardial effusion. Normal caliber of the thoracic aorta. Evaluation of the vascular lumen is limited without IV contrast. Mediastinum/Nodes: No enlarged mediastinal, hilar, or axillary lymph nodes. Thyroid gland, trachea, and esophagus demonstrate no significant findings. Lungs/Pleura: No acute airspace disease, effusion, or pneumothorax. Central airways are patent. Musculoskeletal: No acute or destructive bony abnormalities. Reconstructed images demonstrate no additional findings. CT ABDOMEN PELVIS FINDINGS Hepatobiliary: Unremarkable unenhanced appearance of the liver and gallbladder. Pancreas: Unremarkable unenhanced appearance. Spleen: Unremarkable unenhanced appearance. Adrenals/Urinary Tract: No urinary tract calculi or obstructive uropathy. The adrenals and bladder are unremarkable. Stomach/Bowel: No bowel obstruction or ileus. Normal appendix right lower quadrant. No bowel wall thickening or inflammatory change. Vascular/Lymphatic: Aortic atherosclerosis. No enlarged abdominal or pelvic lymph nodes. Reproductive: Uterus is lobular and slightly enlarged consistent with underlying fibroids. No adnexal masses. Other: No free fluid or free intraperitoneal gas. No  abdominal wall hernia. Musculoskeletal: No acute or destructive bony abnormalities. Postsurgical changes from L3 through L5. Reconstructed images demonstrate no additional findings. IMPRESSION: 1. No acute intrathoracic, intra-abdominal, or intrapelvic process on this unenhanced exam. 2. Cardiomegaly. 3. Fibroid uterus. 4.  Aortic Atherosclerosis (ICD10-I70.0). Electronically Signed   By: Sharlet Salina M.D.   On: 05/06/2023 17:33  CT HEAD WO CONTRAST Result Date: 05/06/2023 CLINICAL DATA:  Altered level of consciousness EXAM: CT HEAD WITHOUT CONTRAST TECHNIQUE: Contiguous axial images were obtained from the base of the skull through the vertex without intravenous contrast. RADIATION DOSE REDUCTION: This exam was performed according to the departmental dose-optimization program which includes automated exposure control, adjustment of the mA and/or kV according to patient size and/or use of iterative reconstruction technique. COMPARISON:  12/11/2022 FINDINGS: Brain: No acute infarct or hemorrhage. Lateral ventricles and midline structures are unremarkable. No acute extra-axial fluid collections. No mass effect. Vascular: No hyperdense vessel or unexpected calcification. Skull: Normal. Negative for fracture or focal lesion. Sinuses/Orbits: No acute finding. Other: None. IMPRESSION: 1. No acute intracranial process. Electronically Signed   By: Sharlet Salina M.D.   On: 05/06/2023 17:28    Vitals:   05/07/23 0730 05/07/23 1056 05/07/23 1223 05/07/23 1315  BP: 134/77  (!) 155/95 (!) 151/90  Pulse: 73  79 84  Resp: 16  19 18   Temp:  (!) 97.5 F (36.4 C)    TempSrc:  Oral    SpO2: 100%  100% 99%  Weight:      Height:         PHYSICAL EXAM General:  Alert, well-nourished, well-developed patient in no acute distress Psych:  Mood and affect appropriate for situation CV: Regular rate and rhythm on monitor Respiratory:  Regular, unlabored respirations on room air GI: Abdomen soft and  nontender   NEURO:  Mental Status: awake and alert, confused able to state her name and month and age, year she stated 2005 and then corrected herself to 2025.  Unable to state correct place.  She follows commands. Speech/Language: speech is without dysarthria or aphasia.  Naming, repetition, fluency, and comprehension intact.  Cranial Nerves:  II: PERRL. Visual fields full.  III, IV, VI: EOMI. Eyelids elevate symmetrically.  V: Sensation is intact to light touch and symmetrical to face.  VII: Face is symmetrical resting and smiling VIII: hearing intact to voice. IX, X: Palate elevates symmetrically. Phonation is normal.  WG:NFAOZHYQ shrug 5/5. XII: tongue is midline without fasciculations. Motor:right arm with drift weak right grip compared to left grip and right leg with drift-can slightly wiggle right toes,  Tone: is normal and bulk is normal Sensation- decrease sensation on right side with right side neglect Coordination: FTN intact bilterally, HKS: no ataxia in BLE.No drift.  Gait- deferred  Most Recent NIH   1a Level of Conscious.: 0 1b LOC Questions: 0 1c LOC Commands: 0 2 Best Gaze: 0 3 Visual: 0 4 Facial Palsy: 0 5a Motor Arm - left: 0 5b Motor Arm - Right:1 6a Motor Leg - Left: 0 6b Motor Leg - Right: 2 7 Limb Ataxia: 0 8 Sensory: 1 9 Best Language: 0 10 Dysarthria: 0 11 Extinct. 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  CTA head & neck no LVO. Large intraluminal filling defect within the aortic arch  MRI  Multifocal bilateral acute ischemia, greatest in the left parietal and occipital lobe, but also present in the posterior right hemisphere. No hemorrhage or mass effect. 2D Echo EF 60 to 65%.  LV with mild hypertrophy LTM EEG started  1/20 LDL 91 HgbA1c 5.7 UDS positive for cocaine VTE prophylaxis -heparin IV No antithrombotic prior to  admission, now on heparin IV  Therapy recommendations:  Pending Disposition: Pending  Hypertension Home meds: Amlodipine 5 mg Stable Long-term BP goal normotensive  Hyperlipidemia Home meds: None LDL 91, goal < 70 Add lovastatin 40 mg Continue statin at discharge  Aortic thrombus CTA chest/ abdomen and pelvis-  Intraluminal thrombus in the aortic arch as seen on CTA neck earlier today. Additional small filling defects in the descending aorta measuring 4 mm. No definite dissection flap. No aneurysm. Wedge-shaped filling defect in the inferior pole of the right kidney is favored to represent a chronic scar though infarct could appear similarly. Cardiothoracic surgery consulted Continue IV heparin low-dose no bolus  Will need transition to p.o. anticoagulation Likely due to uncontrolled risk factors including smoking, alcohol and cocaine use  Tobacco Abuse Patient smokes 5 cigarettes/day       Ready to quit? Yes Nicotine replacement therapy provided  Substance Abuse EtOH abuse Patient uses cocaine UDS positive for Cocaine       Ready to quit? Yes Drinks 1/5 of tequila daily, along with 4 -40 ounce bottles of beer advised to drink no more than 1 drink(s) a day CIWA protocol Thiamine, folate and multivitamin Thiamine pending Vitamin B12-260, now supplementing TOC consult for cessation placed  Influenza A  Flu A PCR + Intermittent coughing Droplet protection Blood culture pending  Other Stroke Risk Factors Obesity, Body mass index is 31.84 kg/m., BMI >/= 30 associated with increased stroke risk, recommend weight loss, diet and exercise as appropriate  Migraines History of PE in 2023   Other Active Problems GERD COPD/asthma Bipolar disorder Schizoaffective disorder History of neuropathy Ammonia level 45--22 Spiking fever Tmax 102.5--afebrile, no  leukocytosis AKI, creatinine 1.33  Hospital day # 0  Gevena Mart DNP, ACNPC-AG  [facility]  ATTENDING NOTE: I reviewed above note and agree with the assessment and plan. Pt was seen and examined.   Sister at bedside.  Patient lying bed, eyes open, awake alert, admitted tobacco abuse, heavy alcohol and cocaine abuse. Orientated to age, place, time and people. No aphasia, paucity of speech, following all simple commands. Able to name and repeat. No gaze palsy, tracking bilaterally, visual field full. No facial droop. Tongue midline. right arm with drift weak right grip compared to left grip and right leg with drift-can slightly wiggle right toes. Sensation decreased on the right with right neglect, b/l left intact, gait not tested.   Patient had embolic shower likely from large aortic arch thrombus from uncontrolled risk factor including tobacco abuse, heavy alcohol and cocaine abuse.  Cessation education provided, currently on CIWA protocol.  Ammonia level normalized.  Long-term EEG in process, no seizure so far.  Afebrile currently.  On heparin IV.  Add statin.  Will follow  For detailed assessment and plan, please refer to above/below as I have made changes wherever appropriate.   Marvel Plan, MD PhD Stroke Neurology 05/07/2023 5:43 PM  I spent extensive face-to-face time with the patient, more than 50% of which was spent in counseling and coordination of care, reviewing test results, images and medication, and discussing the diagnosis, treatment plan and potential prognosis. This patient's care requiresreview of multiple databases, neurological assessment, discussion with patient and her sister, other specialists and medical decision making of high complexity.      To contact Stroke Continuity provider, please refer to [email or web address]. After hours, contact General Neurology

## 2023-05-07 NOTE — Assessment & Plan Note (Signed)
Cocaine positive UDS today. Suspect this is significantly contributing to underlying HTN and aortic ulcer / thrombus.

## 2023-05-07 NOTE — Progress Notes (Addendum)
PHARMACY - ANTICOAGULATION CONSULT NOTE  Pharmacy Consult for heparin Indication:  aortic thrombus  Not on File  Patient Measurements: Height: 5\' 2"  (157.5 cm) Weight: 79 kg (174 lb 1.6 oz) IBW/kg (Calculated) : 50.1 Heparin Dosing Weight: 67.5 kg  Vital Signs: Temp: 97.7 F (36.5 C) (01/20 1455) Temp Source: Oral (01/20 1455) BP: 113/80 (01/20 1455) Pulse Rate: 95 (01/20 1455)  Labs: Recent Labs    05/06/23 1602 05/06/23 1605 05/06/23 2213 05/06/23 2347 05/07/23 0343 05/07/23 1024 05/07/23 1320 05/07/23 1457  HGB 14.6  --   --   --  12.4 14.3  --   --   HCT 43.0  --   --   --  37.3 42.9  --   --   PLT  --   --   --   --  381 168  --   --   APTT  --   --  31  --   --   --   --   --   LABPROT  --   --   --  15.6*  --   --   --   --   INR  --   --   --  1.2  --   --   --   --   HEPARINUNFRC  --   --   --  0.14*  --   --  >1.10* 0.25*  CREATININE 1.30* 1.33*  --   --   --   --   --   --     Estimated Creatinine Clearance: 47.1 mL/min (A) (by C-G formula based on SCr of 1.33 mg/dL (H)).  Medical History: No past medical history on file.  Assessment: 53 y/o female presenting with altered mental status. PMH unknown. CT imaging shows Large intraluminal filling defect within the aortic arch, consistent with thromboembolism. Pharmacy has been consulted to initiate heparin infusion. Per chart review, patient is not on anticoagulation prior to admission.  Per neurology, will initiate infusion without bolus and target lower heparin level goal (0.3-0.5) due to multiple embolic CVA.  Redrawn HL is subtherapeutic (0.25) on 900 units/hr. CBC stable.  Goal of Therapy:  Heparin level 0.3-0.5 units/ml Monitor platelets by anticoagulation protocol: Yes   Plan:  Increase heparin infusion to 1000 units/hr Check anti-Xa level in 6 hours and daily while on heparin Continue to monitor H&H and platelets  Thank you for involving pharmacy in this patient's care.   Nicole Kindred,  PharmD PGY1 Pharmacy Resident 05/07/2023 4:18 PM

## 2023-05-07 NOTE — Assessment & Plan Note (Addendum)
AMS beyond what would be expected from the identified strokes on MRI.  Cause not yet clear: See neuro consult note NPO given mental status Ativan PRN agitation Empiric thiamine TSH low, but T4 of 0.85 is actually WNL (low end of normal) not really consistent with hyperthyroidism driving her AMS. Ammonia slightly elevated at 45 UA neg CT of head and torso not really impressive for infectious source. VBG neg for acidosis or hypercapnea Other possible causes might include: Cocaine withdrawal Cocaine leukoencephalopathy seizures

## 2023-05-07 NOTE — Progress Notes (Signed)
PHARMACY - ANTICOAGULATION CONSULT NOTE  Pharmacy Consult for heparin Indication:  aortic thrombus  Labs: Recent Labs    05/06/23 1602 05/06/23 1602 05/06/23 1605 05/06/23 2213 05/06/23 2347 05/07/23 0343 05/07/23 1024 05/07/23 1320 05/07/23 1457 05/07/23 2314  HGB 14.6  --   --   --   --  12.4 14.3  --   --   --   HCT 43.0  --   --   --   --  37.3 42.9  --   --   --   PLT  --   --   --   --   --  381 168  --   --   --   APTT  --   --   --  31  --   --   --   --   --   --   LABPROT  --   --   --   --  15.6*  --   --   --   --   --   INR  --   --   --   --  1.2  --   --   --   --   --   HEPARINUNFRC  --    < >  --   --  0.14*  --   --  >1.10* 0.25* 0.50  CREATININE 1.30*  --  1.33*  --   --   --   --   --   --   --    < > = values in this interval not displayed.   Assessment: 53yo female therapeutic on heparin but at upper end of goal; no infusion issues or signs of bleeding per RN.  Goal of Therapy:  Heparin level 0.3-0.5 units/ml   Plan:  Decrease heparin infusion slightly to 950 units/hr. Check level in 8 hours.   Vernard Gambles, PharmD, BCPS 05/07/2023 11:36 PM

## 2023-05-07 NOTE — Progress Notes (Signed)
Patient seen and examined.  She is still in the emergency room.  Apparently she is more alert awake.  She does not know she is in the hospital.  She tells me she does not have a home.  She is not aware what happened to her but endorses that she was using cocaine.  I explained to her that she had a stroke and she has blood clots around her heart and she was tearful.  Currently without any focal neurological deficits.  Looks anxious.  Still slightly confused.  Was able to pass bedside swallow evaluation and allow regular diet.  Also started on Tamiflu.  She was trying to connect to her family.  In brief, 53 year old with history of COPD, hypertension, substance abuse, psychiatric disorder presented with being excessively lethargic at home.  MRI brain consistent with multiple embolic infarcts.  CTA without large vessel occlusion however it showed large thrombus at the aortic arch.  CT angiogram of the aorta with above, no dissection.  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  Acute metabolic encephalopathy. Currently stabilizing.  Remains on heparin infusion.  Will allow regular diet.  Neurological monitoring. Will discuss case with CT surgery.  Influenza A infection: Since patient is able to take by mouth, will start on Tamiflu.   Total time spent: 35 minutes.  Same-day admit.  No charge visit.

## 2023-05-07 NOTE — Telephone Encounter (Signed)
Pt in ED under new generated chart, please ignore this encounter.

## 2023-05-08 DIAGNOSIS — F1410 Cocaine abuse, uncomplicated: Secondary | ICD-10-CM | POA: Diagnosis not present

## 2023-05-08 DIAGNOSIS — I7419 Embolism and thrombosis of other parts of aorta: Secondary | ICD-10-CM | POA: Diagnosis not present

## 2023-05-08 DIAGNOSIS — I7410 Embolism and thrombosis of unspecified parts of aorta: Secondary | ICD-10-CM | POA: Diagnosis not present

## 2023-05-08 DIAGNOSIS — R569 Unspecified convulsions: Secondary | ICD-10-CM | POA: Diagnosis not present

## 2023-05-08 DIAGNOSIS — I639 Cerebral infarction, unspecified: Secondary | ICD-10-CM | POA: Diagnosis not present

## 2023-05-08 DIAGNOSIS — R4182 Altered mental status, unspecified: Secondary | ICD-10-CM | POA: Diagnosis not present

## 2023-05-08 LAB — CBC
HCT: 36.9 % (ref 36.0–46.0)
Hemoglobin: 12.1 g/dL (ref 12.0–15.0)
MCH: 29.1 pg (ref 26.0–34.0)
MCHC: 32.8 g/dL (ref 30.0–36.0)
MCV: 88.7 fL (ref 80.0–100.0)
Platelets: 462 10*3/uL — ABNORMAL HIGH (ref 150–400)
RBC: 4.16 MIL/uL (ref 3.87–5.11)
RDW: 15.6 % — ABNORMAL HIGH (ref 11.5–15.5)
WBC: 8.7 10*3/uL (ref 4.0–10.5)
nRBC: 0 % (ref 0.0–0.2)

## 2023-05-08 LAB — HEPARIN LEVEL (UNFRACTIONATED)
Heparin Unfractionated: 0.53 [IU]/mL (ref 0.30–0.70)
Heparin Unfractionated: 0.33 [IU]/mL (ref 0.30–0.70)

## 2023-05-08 MED ORDER — HEPARIN 30,000 UNITS/1000 ML (OHS) CELLSAVER SOLUTION
Status: DC
  Filled 2023-05-08: qty 1000

## 2023-05-08 MED ORDER — NITROGLYCERIN IN D5W 200-5 MCG/ML-% IV SOLN
2.0000 ug/min | INTRAVENOUS | Status: DC
  Filled 2023-05-08: qty 250

## 2023-05-08 MED ORDER — TRANEXAMIC ACID (OHS) PUMP PRIME SOLUTION
2.0000 mg/kg | INTRAVENOUS | Status: DC
  Filled 2023-05-08: qty 1.58

## 2023-05-08 MED ORDER — NOREPINEPHRINE 4 MG/250ML-% IV SOLN
0.0000 ug/min | INTRAVENOUS | Status: DC
  Filled 2023-05-08: qty 250

## 2023-05-08 MED ORDER — MANNITOL 20 % IV SOLN
INTRAVENOUS | Status: AC
  Filled 2023-05-08: qty 13

## 2023-05-08 MED ORDER — VANCOMYCIN HCL 1000 MG IV SOLR
INTRAVENOUS | Status: DC
  Filled 2023-05-08: qty 20

## 2023-05-08 MED ORDER — METOPROLOL TARTRATE 50 MG PO TABS: 100.0000 mg | ORAL_TABLET | Freq: Once | ORAL | Status: DC

## 2023-05-08 MED ORDER — PHENYLEPHRINE HCL-NACL 20-0.9 MG/250ML-% IV SOLN
30.0000 ug/min | INTRAVENOUS | Status: DC
  Filled 2023-05-08: qty 250

## 2023-05-08 MED ORDER — TRANEXAMIC ACID (OHS) BOLUS VIA INFUSION
15.0000 mg/kg | INTRAVENOUS | Status: DC
  Filled 2023-05-08: qty 1185

## 2023-05-08 MED ORDER — INSULIN REGULAR(HUMAN) IN NACL 100-0.9 UT/100ML-% IV SOLN
INTRAVENOUS | Status: DC
  Filled 2023-05-08: qty 100

## 2023-05-08 MED ORDER — EPINEPHRINE HCL 5 MG/250ML IV SOLN IN NS
0.0000 ug/min | INTRAVENOUS | Status: DC
  Filled 2023-05-08: qty 250

## 2023-05-08 MED ORDER — CEFAZOLIN SODIUM-DEXTROSE 2-4 GM/100ML-% IV SOLN
2.0000 g | INTRAVENOUS | Status: DC
  Filled 2023-05-08: qty 100

## 2023-05-08 MED ORDER — METOPROLOL TARTRATE 50 MG PO TABS: 100.0000 mg | ORAL_TABLET | Freq: Once | ORAL | Status: DC | PRN

## 2023-05-08 MED ORDER — VANCOMYCIN HCL 1250 MG/250ML IV SOLN
1250.0000 mg | INTRAVENOUS | Status: DC
  Filled 2023-05-08: qty 250

## 2023-05-08 MED ORDER — TRANEXAMIC ACID 1000 MG/10ML IV SOLN
1.5000 mg/kg/h | INTRAVENOUS | Status: DC
  Filled 2023-05-08: qty 25

## 2023-05-08 MED ORDER — MILRINONE LACTATE IN DEXTROSE 20-5 MG/100ML-% IV SOLN
0.3000 ug/kg/min | INTRAVENOUS | Status: DC
  Filled 2023-05-08: qty 100

## 2023-05-08 MED ORDER — DEXMEDETOMIDINE HCL IN NACL 400 MCG/100ML IV SOLN
0.1000 ug/kg/h | INTRAVENOUS | Status: DC
  Filled 2023-05-08: qty 100

## 2023-05-08 MED ORDER — POTASSIUM CHLORIDE 2 MEQ/ML IV SOLN
80.0000 meq | INTRAVENOUS | Status: DC
  Filled 2023-05-08: qty 40

## 2023-05-08 NOTE — Progress Notes (Signed)
STROKE TEAM PROGRESS NOTE   BRIEF HPI [name] is a 53 y.o. female presented to the hospital by family because she had been asleep all day. MRI Brain shows embolic appearing infarcts, largest in left parietal lobe.   NIH on Admission 23   SIGNIFICANT HOSPITAL EVENTS 1/19-MRI Brain shows embolic appearing infarcts, largest in left parietal lobe.  CT head with no acute infarct.  CTA with no LVO however large Aortic arch thrombus   INTERIM HISTORY/SUBJECTIVE Family is at the bedside.  She has just been placed on long-term EEG MRI Brain shows embolic appearing infarcts, largest in left parietal lobe.  CT head with no acute infarct.  CTA with no LVO however large Aortic arch thrombus.  She is on IV heparin  UDS positive for cocaine.  She also endorses drinking 440 ounces of beer daily and 1/5 of tequila daily.  She also smokes about 5 cigarettes/day B12-260 Folate 17.2 TSH 0.118 Ammonia 45->22 A1c 5.7 LDL 91  On exam she is awake and alert, confused able to state her name and month and age, year she stated 2005 and then corrected herself to 2025.  Unable to state correct place.  She follows commands.  Eyes are midline and tracks.  Visual fields are full face is symmetric, right arm with drift weak right grip compared to left grip and right leg with drift-can slightly wiggle right toes, decrease sensation on right side with right side neglect  OBJECTIVE  CBC    Component Value Date/Time   WBC 8.7 05/08/2023 0833   RBC 4.16 05/08/2023 0833   HGB 12.1 05/08/2023 0833   HCT 36.9 05/08/2023 0833   PLT 462 (H) 05/08/2023 0833   MCV 88.7 05/08/2023 0833   MCH 29.1 05/08/2023 0833   MCHC 32.8 05/08/2023 0833   RDW 15.6 (H) 05/08/2023 0833    BMET    Component Value Date/Time   NA 137 05/06/2023 1605   K 3.9 05/06/2023 1605   CL 105 05/06/2023 1605   CO2 23 05/06/2023 1605   GLUCOSE 80 05/06/2023 1605   BUN 16 05/06/2023 1605   CREATININE 1.33 (H) 05/06/2023 1605    CALCIUM 9.1 05/06/2023 1605   GFRNONAA 48 (L) 05/06/2023 1605    IMAGING past 24 hours Overnight EEG with video Result Date: 05/08/2023 Charlsie Quest, MD     05/08/2023 11:27 AM Patient Name: Kathryn [name] MRN: [id number] Epilepsy Attending: Charlsie Quest Referring Physician/Provider: Erick Blinks, MD Duration: 05/07/2023 4696 to 05/08/2023 2952 Patient history: 53yo F with stroke getting eeg to evaluate for seizure Level of alertness: Awake, asleep AEDs during EEG study: None Technical aspects: This EEG study was done with scalp electrodes positioned according to the 10-20 International system of electrode placement. Electrical activity was reviewed with band pass filter of 1-70Hz , sensitivity of 7 uV/mm, display speed of 4mm/sec with a 60Hz  notched filter applied as appropriate. EEG data were recorded continuously and digitally stored.  Video monitoring was available and reviewed as appropriate. Description: The posterior dominant rhythm consists of 8 Hz activity of moderate voltage (25-35 uV) seen predominantly in posterior head regions, symmetric and reactive to eye opening and eye closing. Sleep was characterized by vertex waves, sleep spindles (12 to 14 Hz), maximal frontocentral region. EEG showed intermittent generalized and maximal left temporal region 3 to 6 Hz theta-delta slowing.  Hyperventilation and photic stimulation were not performed.   ABNORMALITY - Intermittent slow, generalized and maximal left temporal IMPRESSION: This study is suggestive of  cortical dysfunction in left temporal region likely secondary to underlying structural abnormality.  Additionally there is mild diffuse encephalopathy.  No seizures or epileptiform discharges were seen throughout the recording. Charlsie Quest    Vitals:   05/08/23 0326 05/08/23 0752 05/08/23 1119 05/08/23 1500  BP: (!) 129/90 132/80 135/65 137/84  Pulse: 75 80 76 80  Resp: 16 18 18 16   Temp: 98.1 F (36.7 C) 98.5 F (36.9 C) 99.2  F (37.3 C) 98.4 F (36.9 C)  TempSrc: Oral Oral Oral Oral  SpO2: 99% 100% 98% 100%  Weight:      Height:         PHYSICAL EXAM General:  Alert, well-nourished, well-developed patient in no acute distress Psych:  Mood and affect appropriate for situation CV: Regular rate and rhythm on monitor Respiratory:  Regular, unlabored respirations on room air GI: Abdomen soft and nontender  NEURO:  Pt eyes open, awake alert, admitted tobacco abuse, heavy alcohol and cocaine abuse. Orientated to age, place, time. No aphasia, paucity of speech, following all simple commands. Able to name and repeat. No gaze palsy, tracking bilaterally, visual field full. No facial droop. Tongue midline. right arm with drift weak right grip compared to left grip and right leg with drift-can slightly wiggle right toes. Sensation decreased on the right with right neglect, b/l left intact, gait not tested.    ASSESSMENT/PLAN  Stroke:  bilateral L>R left parietal and occipital lobe, embolic pattern, etiology: Large aortic arch thrombus due to uncontrolled risk factors including smoking, heavy alcohol and cocaine use CT head No acute abnormality.  CTA head & neck no LVO. Large intraluminal filling defect within the aortic arch  MRI  Multifocal bilateral acute ischemia, greatest in the left parietal and occipital lobe, but also present in the posterior right hemisphere. No hemorrhage or mass effect. 2D Echo EF 60 to 65%.  LV with mild hypertrophy LTM EEG showed no seizure -> discontinue LDL 91 HgbA1c 5.7 UDS positive for cocaine VTE prophylaxis -heparin IV No antithrombotic prior to admission, now on heparin IV  Therapy recommendations:  CIR Disposition: Pending  Hypertension Home meds: Amlodipine 5 mg Stable Long-term BP goal normotensive  Hyperlipidemia Home meds: None LDL 91, goal < 70 Add lovastatin 40 mg Continue statin at discharge  Aortic thrombus CTA chest/ abdomen and pelvis-  Intraluminal  thrombus in the aortic arch as seen on CTA neck earlier today. Additional small filling defects in the descending aorta measuring 4 mm. No definite dissection flap. No aneurysm. Wedge-shaped filling defect in the inferior pole of the right kidney is favored to represent a chronic scar though infarct could appear similarly. Likely due to uncontrolled risk factors including smoking, alcohol and cocaine use Cardiothoracic surgery consulted -> high risk for thrombectomy Continue IV heparin low-dose no bolus  Plan to repeat CTA next week and if no improvement, CT surgery may consider thrombectomy at that time.   Tobacco Abuse Patient smokes 5 cigarettes/day       Ready to quit? Yes Nicotine replacement therapy provided  Substance Abuse EtOH abuse Patient uses cocaine UDS positive for Cocaine       Ready to quit? Yes Drinks 1/5 of tequila daily, along with 4 -40 ounce bottles of beer advised to drink no more than 1 drink(s) a day CIWA protocol Thiamine, folate and multivitamin Thiamine pending Vitamin B12-260, now supplementing TOC consult for cessation placed  Influenza A  Flu A PCR + Intermittent coughing Droplet protection Blood culture NGTD  Other Stroke Risk Factors Obesity, Body mass index is 31.84 kg/m., BMI >/= 30 associated with increased stroke risk, recommend weight loss, diet and exercise as appropriate  Migraines History of PE in 2023  Other Active Problems GERD COPD/asthma Bipolar disorder Schizoaffective disorder History of neuropathy Ammonia level 45--22 Spiking fever Tmax 102.5 on admission --afebrile, no leukocytosis AKI, creatinine 1.33  Hospital day # 1   Marvel Plan, MD PhD Stroke Neurology 05/08/2023 6:15 PM    To contact Stroke Continuity provider, please refer to [email or web address]. After hours, contact General Neurology

## 2023-05-08 NOTE — Progress Notes (Signed)
PHARMACY - ANTICOAGULATION CONSULT NOTE  Pharmacy Consult for heparin Indication:  aortic thrombus  Allergies  Allergen Reactions   Motrin [Ibuprofen] Anaphylaxis   Flexeril [Cyclobenzaprine] Hives    Patient Measurements: Height: 5\' 2"  (157.5 cm) Weight: 79 kg (174 lb 1.6 oz) IBW/kg (Calculated) : 50.1 Heparin Dosing Weight: 67.5 kg  Vital Signs: Temp: 98.4 F (36.9 C) (01/21 1500) Temp Source: Oral (01/21 1500) BP: 137/84 (01/21 1500) Pulse Rate: 80 (01/21 1500)  Labs: Recent Labs    05/06/23 1602 05/06/23 1605 05/06/23 2213 05/06/23 2347 05/07/23 0343 05/07/23 1024 05/07/23 1320 05/07/23 2314 05/08/23 0833 05/08/23 1841  HGB 14.6  --   --   --  12.4 14.3  --   --  12.1  --   HCT 43.0  --   --   --  37.3 42.9  --   --  36.9  --   PLT  --   --   --   --  381 168  --   --  462*  --   APTT  --   --  31  --   --   --   --   --   --   --   LABPROT  --   --   --  15.6*  --   --   --   --   --   --   INR  --   --   --  1.2  --   --   --   --   --   --   HEPARINUNFRC  --   --   --  0.14*  --   --    < > 0.50 0.53 0.33  CREATININE 1.30* 1.33*  --   --   --   --   --   --   --   --    < > = values in this interval not displayed.    Estimated Creatinine Clearance: 47.1 mL/min (A) (by C-G formula based on SCr of 1.33 mg/dL (H)).  Medical History: No past medical history on file.  Assessment: 53 y/o female presenting with altered mental status. PMH unknown. CT imaging shows Large intraluminal filling defect within the aortic arch, consistent with thromboembolism. Pharmacy has been consulted to initiate heparin infusion. Per chart review, patient is not on anticoagulation prior to admission.  Per neurology, will initiate infusion without bolus and target lower heparin level goal (0.3-0.5) due to multiple embolic CVA.  Heparin level 0.33 therapeutic after rate decrease to 900 units/hr.    Goal of Therapy:  Heparin level 0.3-0.5 units/ml Monitor platelets by  anticoagulation protocol: Yes   Plan:  Continue heparin infusion 900 units/hr Check anti-Xa level daily while on heparin Continue to monitor H&H and platelets F/u oral AC  Trixie Rude, PharmD Clinical Pharmacist 05/08/2023  7:26 PM

## 2023-05-08 NOTE — Consult Note (Signed)
Cardiology Consultation:  Patient ID: Kathryn [name] MRN: [id number]; DOB: January 07, 1971  Admit date: 05/06/2023 Date of Consult: 05/08/2023  Primary Care Provider: Patient, No Pcp Per Primary Cardiologist: None  Primary Electrophysiologist:  None   Patient Profile:  Kathryn [name] is a 53 y.o. female with a hx of COPD, hypertension, polysubstance abuse who is being seen today for the evaluation of stroke/preoperative assessment at the request of Dorcas Carrow, MD.  History of Present Illness:  Kathryn [name] was admitted on 05/06/2023 with encephalopathy secondary to stroke, cocaine abuse, and influenza.  The patient was brought in by family due to worsening lethargy.  Brain MRI was consistent with multiple areas of ischemic strokes.  CT angio chest consistent with aortic arch thrombus.  An echocardiogram was obtained that showed normal LV function 60 to 65%.  Labs notable for serum creatinine 1.33.  LDL cholesterol 91.  Hemoglobin 12.1.  Platelets 462.  UDS positive for cocaine. The patient was evaluated by cardiac surgery and they have plan for surgery of this.  Cardiology was asked to assess for preoperative management.  EKG shows normal sinus rhythm heart rate 91 with no acute ischemic changes or evidence of infarction.  She reports no prior heart history.  She does use crack cocaine.  She tells me she also uses tobacco when she uses crack cocaine which coincidentally is every day.  She does report no history of heart attack or stroke prior to this episode.  She is a bit worried about this large aortic arch thrombus.  Apparently CT surgery is recommending surgery.  She is not diabetic no history of heart disease in the family.  EKG is nonischemic.  We did discuss given her drug and tobacco use I would recommend coronary CTA prior to surgery.  She understands this.  We will proceed with this tomorrow.    Past Medical History: No past medical history on file.  Past Surgical History: None  Allergies:     Allergies  Allergen Reactions   Motrin [Ibuprofen] Anaphylaxis   Flexeril [Cyclobenzaprine] Hives    Social History:   Social History   Socioeconomic History   Marital status: Single    Spouse name: Not on file   Number of children: Not on file   Years of education: Not on file   Highest education level: Not on file  Occupational History   Not on file  Tobacco Use   Smoking status: Not on file   Smokeless tobacco: Not on file  Substance and Sexual Activity   Alcohol use: Not on file   Drug use: Not on file   Sexual activity: Not on file  Other Topics Concern   Not on file  Social History Narrative   Not on file   Social Drivers of Health   Financial Resource Strain: Not on file  Food Insecurity: Not on file  Transportation Needs: Not on file  Physical Activity: Not on file  Stress: Not on file  Social Connections: Not on file  Intimate Partner Violence: Not on file     Family History:   No family history on file.   ROS:  All other ROS reviewed and negative. Pertinent positives noted in the HPI.     Physical Exam/Data:   Vitals:   05/08/23 0326 05/08/23 0752 05/08/23 1119 05/08/23 1500  BP: (!) 129/90 132/80 135/65 137/84  Pulse: 75 80 76 80  Resp: 16 18 18 16   Temp: 98.1 F (36.7 C) 98.5 F (36.9 C) 99.2 F (37.3  C) 98.4 F (36.9 C)  TempSrc: Oral Oral Oral Oral  SpO2: 99% 100% 98% 100%  Weight:      Height:        Intake/Output Summary (Last 24 hours) at 05/08/2023 1628 Last data filed at 05/08/2023 1600 Gross per 24 hour  Intake 765.02 ml  Output --  Net 765.02 ml       05/06/2023    8:31 PM  Last 3 Weights  Weight (lbs) 174 lb 1.6 oz  Weight (kg) 78.971 kg    Body mass index is 31.84 kg/m.  General: Well nourished, well developed, in no acute distress Head: Atraumatic, normal size  Eyes: PEERLA, EOMI  Neck: Supple, no JVD Endocrine: No thryomegaly Cardiac: Normal S1, S2; RRR; no murmurs, rubs, or gallops Lungs: Clear to  auscultation bilaterally, no wheezing, rhonchi or rales  Abd: Soft, nontender, no hepatomegaly  Ext: No edema, pulses 2+ Musculoskeletal: No deformities, BUE and BLE strength normal and equal Skin: Warm and dry, no rashes   Neuro: Alert and oriented to person, place, time, and situation, CNII-XII grossly intact, no focal deficits  Psych: Normal mood and affect   EKG:  The EKG was personally reviewed and demonstrates: Normal sinus rhythm heart rate 91, no acute ischemic changes or evidence of infarction Telemetry:  Telemetry was personally reviewed and demonstrates:  SR 60-70 bpm  Relevant CV Studies: TTE 05/07/2023  1. Left ventricular ejection fraction, by estimation, is 60 to 65%. The  left ventricle has normal function. The left ventricle has no regional  wall motion abnormalities. There is mild left ventricular hypertrophy.  Left ventricular diastolic parameters  were normal.   2. Right ventricular systolic function is normal. The right ventricular  size is normal.   3. The mitral valve is normal in structure. Trivial mitral valve  regurgitation. No evidence of mitral stenosis.   4. The aortic valve is tricuspid. There is mild thickening of the aortic  valve. Aortic valve regurgitation is mild. No aortic stenosis is present.   Assessment and Plan:   # Cardioembolic Stroke # Aortic arch thrombus # Tobacco abuse # Preoperative assessment -She is admitted with cardioembolic stroke.  Found to have large aortic arch thrombus.  Currently on heparin. -Cardiac surgery has recommended aortic arch endarterectomy. -I have asked for preoperative assessment. -Due to the fact that she will have open heart surgery and is using tobacco daily with crack cocaine I would recommend a coronary CTA to exclude obstructive CAD.  Her EKG is nonischemic and she does not report angina but I think this is warranted given her high risk surgery. -We will proceed with coronary CTA tomorrow.  100 mg of  metoprolol tartrate to [place]fe[place] for the scan.  Recheck BMP tomorrow. -Further recommendations pending coronary CTA.  # Aortic arch thrombus -Would likely recommend indefinite anticoagulation after surgery.  Continue heparin for now.  Would recommend also continue statin.  We also discussed refraining from tobacco and cocaine.  # Cocaine abuse # Tobacco abuse -Cessation advised.      For questions or updates, please contact [facility] Please consult [email or web address] for contact info under    Signed, Gerri Spore T. Flora Lipps, MD, [facility]  [facility]  05/08/2023 4:28 PM

## 2023-05-08 NOTE — Progress Notes (Signed)
PROGRESS NOTE    Columba Mccoll  BJY:[id number] DOB: 1970-09-11 DOA: 05/06/2023 PCP: Patient, No Pcp Per    Brief Narrative:  53 year old with history of COPD, hypertension, substance abuse, psychiatric disorder presented with being excessively lethargic at home.  Patient was sleeping at home all day so her daughter called EMS.  In the emergency room initially agitated then sleepy after Ativan.  MRI brain consistent with multiple embolic infarcts. CTA without large vessel occlusion however it showed large thrombus at the aortic arch. CT angiogram of the aorta with above, no dissection.  Admitted with neurology and CT surgery consultation.  Subjective: Patient seen in the morning rounds.  Sleepy.  Wakes up and denies any complaints.  No other overnight events noted.  Currently getting EEG. 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 Clinical findings, lethargic in the context of stroke, influenza A. CT head findings, no acute findings. MRI of the brain, multifocal bilateral ischemia.  No pressure effect. CTA of the head and neck with no large vessel occlusion.  Large intraluminal filling defect within the aortic arch. CT angiogram aorta, chest abdomen pelvis with no aortic dissection but large thrombus load on ascending aorta. 2D echocardiogram, normal EF.  No evidence of intracardiac thrombus. Antiplatelet therapy, none at home.  Currently tolerating heparin. LDL 91. Hemoglobin A1c, 5-7. Followed by neurology.  According to the surgery, bedrest until thrombectomy.  Acute metabolic encephalopathy: Likely multifactorial.  Stroke, influenza, drugs.  Mental status improving.  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  Primary cause unknown.  Active cocaine use.  On IV heparin. CT surgery following, probable thrombectomy tomorrow.  Continue heparin today.  She will need lifelong  anticoagulation after the procedure.  Smoker and cocaine use: Nicotine provided.  Not ready to get adequate counseling.  Alcoholism: Drinks alcohol 1/5 of tequila daily along with a beer.  CIWA protocol.  Multivitamins.  Influenza A: Positive for flu a PCR.  Tamiflu for 5 days.  AKI: Treated with IV fluid will recheck levels tomorrow morning.  Bipolar disorder: Patient on Celexa, Lyrica, Seroquel at home.  Holding until she is awake.  Essential hypertension: Blood pressure stable.  Holding antihypertensives for permissive hypertension.  DVT prophylaxis: Heparin infusion   Code Status: Full code Family Communication: None at the bedside Disposition Plan: Status is: Inpatient Remains inpatient appropriate because: Stroke workup, inpatient procedures planned     Consultants:  CT surgery Neurology  Procedures:  EEG  Antimicrobials:  Tamiflu 1/20---     Objective: Vitals:   05/07/23 2336 05/08/23 0326 05/08/23 0752 05/08/23 1119  BP: 131/68 (!) 129/90 132/80 135/65  Pulse: 71 75 80 76  Resp: 18 16 18 18   Temp: 98 F (36.7 C) 98.1 F (36.7 C) 98.5 F (36.9 C) 99.2 F (37.3 C)  TempSrc: Oral Oral Oral Oral  SpO2: 98% 99% 100% 98%  Weight:      Height:       No intake or output data in the 24 hours ending 05/08/23 1130 Filed Weights   05/06/23 2031  Weight: 79 kg    Examination:  General exam: Appears calm .  Quiet and comfortable.  Follows commands.  Slightly anxious.  Sleepy. Respiratory system: Clear to auscultation. Respiratory effort normal. Cardiovascular system: S1 & S2 heard, RRR. No JVD, murmurs, rubs, gallops or clicks. No pedal edema. Gastrointestinal system: Abdomen is nondistended, soft and nontender. No organomegaly or masses felt.  Normal bowel sounds heard. Central nervous system: Alert and awake on stimulation.  Mostly oriented but is still forgetful.  Moves all extremities equally.  Flat affect.   Data Reviewed: I have personally reviewed  following labs and imaging studies  CBC: Recent Labs  Lab 05/06/23 1602 05/07/23 0343 05/07/23 1024 05/08/23 0833  WBC  --  6.7 6.2 8.7  HGB 14.6 12.4 14.3 12.1  HCT 43.0 37.3 42.9 36.9  MCV  --  88.6 87.6 88.7  PLT  --  381 168 462*   Basic Metabolic Panel: Recent Labs  Lab 05/06/23 1602 05/06/23 1605  NA 140 137  K 4.4 3.9  CL 106 105  CO2  --  23  GLUCOSE 84 80  BUN 19 16  CREATININE 1.30* 1.33*  CALCIUM  --  9.1  MG  --  2.3   GFR: Estimated Creatinine Clearance: 47.1 mL/min (A) (by C-G formula based on SCr of 1.33 mg/dL (H)). Liver Function Tests: Recent Labs  Lab 05/06/23 1605  AST 35  ALT 20  ALKPHOS 88  BILITOT 0.5  PROT 8.4*  ALBUMIN 3.9   No results for input(s): "LIPASE", "AMYLASE" in the last 168 hours. Recent Labs  Lab 05/06/23 1605 05/07/23 1024  AMMONIA 45* 22   Coagulation Profile: Recent Labs  Lab 05/06/23 2347  INR 1.2   Cardiac Enzymes: No results for input(s): "CKTOTAL", "CKMB", "CKMBINDEX", "TROPONINI" in the last 168 hours. BNP (last 3 results) No results for input(s): "PROBNP" in the last 8760 hours. HbA1C: Recent Labs    05/07/23 0343  HGBA1C 5.7*   CBG: Recent Labs  Lab 05/06/23 1540 05/07/23 1637  GLUCAP 71 84   Lipid Profile: Recent Labs    05/07/23 1233  CHOL 157  HDL 45  LDLCALC 91  TRIG 104  CHOLHDL 3.5   Thyroid Function Tests: Recent Labs    05/06/23 2213 05/06/23 2347  TSH 0.118*  --   FREET4  --  0.85   Anemia Panel: Recent Labs    05/06/23 2213  VITAMINB12 260  FOLATE 17.2   Sepsis Labs: Recent Labs  Lab 05/06/23 1728 05/06/23 1930  LATICACIDVEN 1.5 1.1    Recent Results (from the past 240 hours)  Resp panel by RT-PCR (RSV, Flu A&B, Covid) Anterior Nasal Swab     Status: Abnormal   Collection Time: 05/06/23  4:01 PM   Specimen: Anterior Nasal Swab  Result Value Ref Range Status   SARS Coronavirus 2 by RT PCR NEGATIVE NEGATIVE Final    Comment: (NOTE) SARS-CoV-2 target  nucleic acids are NOT DETECTED.  The SARS-CoV-2 RNA is generally detectable in upper respiratory specimens during the acute phase of infection. The lowest concentration of SARS-CoV-2 viral copies this assay can detect is 138 copies/mL. A negative result does not preclude SARS-Cov-2 infection and should not be used as the sole basis for treatment or other patient management decisions. A negative result may occur with  improper specimen collection/handling, submission of specimen other than nasopharyngeal swab, presence of viral mutation(s) within the areas targeted by this assay, and inadequate number of viral copies(<138 copies/mL). A negative result must be combined with clinical observations, patient history, and epidemiological information. The expected result is Negative.  Fact Sheet for Patients:  BloggerCourse.com  Fact Sheet for Healthcare Providers:  SeriousBroker.it  This test is no t yet approved or cleared by the [place] FDA and  has been authorized for detection and/or diagnosis of SARS-CoV-2 by FDA under an Emergency Use  Authorization (EUA). This EUA will remain  in effect (meaning this test can be used) for the duration of the COVID-19 declaration under Section 564(b)(1) of the Act, 21 U.S.C.section 360bbb-3(b)(1), unless the authorization is terminated  or revoked sooner.       Influenza A by PCR POSITIVE (A) NEGATIVE Final   Influenza B by PCR NEGATIVE NEGATIVE Final    Comment: (NOTE) The Xpert Xpress SARS-CoV-2/FLU/RSV plus assay is intended as an aid in the diagnosis of influenza from Nasopharyngeal swab specimens and should not be used as a sole basis for treatment. Nasal washings and aspirates are unacceptable for Xpert Xpress SARS-CoV-2/FLU/RSV testing.  Fact Sheet for Patients: BloggerCourse.com  Fact Sheet for Healthcare  Providers: SeriousBroker.it  This test is not yet approved or cleared by the [place] FDA and has been authorized for detection and/or diagnosis of SARS-CoV-2 by FDA under an Emergency Use Authorization (EUA). This EUA will remain in effect (meaning this test can be used) for the duration of the COVID-19 declaration under Section 564(b)(1) of the Act, 21 U.S.C. section 360bbb-3(b)(1), unless the authorization is terminated or revoked.     Resp Syncytial Virus by PCR NEGATIVE NEGATIVE Final    Comment: (NOTE) Fact Sheet for Patients: BloggerCourse.com  Fact Sheet for Healthcare Providers: SeriousBroker.it  This test is not yet approved or cleared by the [place] FDA and has been authorized for detection and/or diagnosis of SARS-CoV-2 by FDA under an Emergency Use Authorization (EUA). This EUA will remain in effect (meaning this test can be used) for the duration of the COVID-19 declaration under Section 564(b)(1) of the Act, 21 U.S.C. section 360bbb-3(b)(1), unless the authorization is terminated or revoked.  Performed at [facility], [address], [place], [place] [address]   Blood Culture (routine x 2)     Status: None (Preliminary result)   Collection Time: 05/06/23  4:05 PM   Specimen: BLOOD RIGHT ARM  Result Value Ref Range Status   Specimen Description BLOOD RIGHT ARM BLOOD  Final   Special Requests   Final    BOTTLES DRAWN AEROBIC AND ANAEROBIC Blood Culture results may not be optimal due to an inadequate volume of blood received in culture bottles   Culture   Final    NO GROWTH 2 DAYS Performed at [facility], [address], [place], [place] [address]    Report Status PENDING  Incomplete  Blood Culture (routine x 2)     Status: None (Preliminary result)   Collection Time: 05/06/23  7:30 PM   Specimen: BLOOD LEFT HAND  Result Value Ref Range Status   Specimen Description BLOOD  LEFT HAND BLOOD  Final   Special Requests AEROBIC BOTTLE ONLY Blood Culture adequate volume  Final   Culture   Final    NO GROWTH 2 DAYS Performed at [facility], [address] [place], [place] [address]    Report Status PENDING  Incomplete         Radiology Studies: Overnight EEG with video Result Date: 05/08/2023 Charlsie Quest, MD     05/08/2023 11:27 AM Patient Name: Seanne Fetters MRN: [id number] Epilepsy Attending: Charlsie Quest Referring Physician/Provider: Erick Blinks, MD Duration: 05/07/2023 2130 to 05/08/2023 8657 Patient history: 53yo F with stroke getting eeg to evaluate for seizure Level of alertness: Awake, asleep AEDs during EEG study: None Technical aspects: This EEG study was done with scalp electrodes positioned according to the 10-20 International system of electrode placement. Electrical activity was reviewed with band pass  filter of 1-70Hz , sensitivity of 7 uV/mm, display speed of 65mm/sec with a 60Hz  notched filter applied as appropriate. EEG data were recorded continuously and digitally stored.  Video monitoring was available and reviewed as appropriate. Description: The posterior dominant rhythm consists of 8 Hz activity of moderate voltage (25-35 uV) seen predominantly in posterior head regions, symmetric and reactive to eye opening and eye closing. Sleep was characterized by vertex waves, sleep spindles (12 to 14 Hz), maximal frontocentral region. EEG showed intermittent generalized and maximal left temporal region 3 to 6 Hz theta-delta slowing.  Hyperventilation and photic stimulation were not performed.   ABNORMALITY - Intermittent slow, generalized and maximal left temporal IMPRESSION: This study is suggestive of cortical dysfunction in left temporal region likely secondary to underlying structural abnormality.  Additionally there is mild diffuse encephalopathy.  No seizures or epileptiform discharges were seen throughout the recording. Charlsie Quest    ECHOCARDIOGRAM COMPLETE Result Date: 05/07/2023    ECHOCARDIOGRAM REPORT   Patient Name:   NORRIE Gundrum Date of Exam: 05/07/2023 Medical Rec #:  [id number]     Height:       62.0 in Accession #:    [id number]    Weight:       174.1 lb Date of Birth:  Feb 02, 1971      BSA:          1.802 m Patient Age:    53 years      BP:           115/69 mmHg Patient Gender: F             HR:           77 bpm. Exam Location:  Inpatient Procedure: 2D Echo, 3D Echo, Cardiac Doppler and Color Doppler Indications:    Stroke  History:        Patient has no prior history of Echocardiogram examinations.  Sonographer:    Karma Ganja Referring Phys: [phone] JARED M GARDNER  Sonographer Comments: Technically challenging study due to limited acoustic windows and no subcostal window. Image acquisition challenging due to uncooperative patient. IMPRESSIONS  1. Left ventricular ejection fraction, by estimation, is 60 to 65%. The left ventricle has normal function. The left ventricle has no regional wall motion abnormalities. There is mild left ventricular hypertrophy. Left ventricular diastolic parameters were normal.  2. Right ventricular systolic function is normal. The right ventricular size is normal.  3. The mitral valve is normal in structure. Trivial mitral valve regurgitation. No evidence of mitral stenosis.  4. The aortic valve is tricuspid. There is mild thickening of the aortic valve. Aortic valve regurgitation is mild. No aortic stenosis is present. FINDINGS  Left Ventricle: Left ventricular ejection fraction, by estimation, is 60 to 65%. The left ventricle has normal function. The left ventricle has no regional wall motion abnormalities. The left ventricular internal cavity size was normal in size. There is  mild left ventricular hypertrophy. Left ventricular diastolic parameters were normal. Right Ventricle: The right ventricular size is normal. No increase in right ventricular wall thickness. Right ventricular systolic function is  normal. Left Atrium: Left atrial size was normal in size. Right Atrium: Right atrial size was normal in size. Pericardium: There is no evidence of pericardial effusion. Mitral Valve: The mitral valve is normal in structure. Trivial mitral valve regurgitation. No evidence of mitral valve stenosis. Tricuspid Valve: The tricuspid valve is normal in structure. Tricuspid valve regurgitation is trivial. No evidence of tricuspid stenosis. Aortic Valve: The aortic  valve is tricuspid. There is mild thickening of the aortic valve. Aortic valve regurgitation is mild. Aortic regurgitation PHT measures 393 msec. No aortic stenosis is present. Aortic valve mean gradient measures 5.0 mmHg. Aortic  valve peak gradient measures 12.0 mmHg. Aortic valve area, by VTI measures 2.10 cm. Pulmonic Valve: The pulmonic valve was normal in structure. Pulmonic valve regurgitation is trivial. No evidence of pulmonic stenosis. Aorta: The aortic root is normal in size and structure. IAS/Shunts: The interatrial septum was not well visualized.  LEFT VENTRICLE PLAX 2D LVIDd:         4.60 cm   Diastology LVIDs:         2.70 cm   LV e' medial:    8.24 cm/s LV PW:         1.10 cm   LV E/e' medial:  10.3 LV IVS:        1.00 cm   LV e' lateral:   9.64 cm/s LVOT diam:     1.80 cm   LV E/e' lateral: 8.8 LV SV:         70 LV SV Index:   39 LVOT Area:     2.54 cm                           3D Volume EF:                          3D EF:        54 %                          LV EDV:       114 ml                          LV ESV:       53 ml                          LV SV:        62 ml RIGHT VENTRICLE             IVC RV Basal diam:  3.40 cm     IVC diam: 1.70 cm RV S prime:     13.40 cm/s TAPSE (M-mode): 2.3 cm LEFT ATRIUM             Index        RIGHT ATRIUM           Index LA diam:        4.20 cm 2.33 cm/m   RA Area:     16.20 cm LA Vol (A2C):   66.0 ml 36.62 ml/m  RA Volume:   39.60 ml  21.97 ml/m LA Vol (A4C):   46.8 ml 25.97 ml/m LA Biplane Vol: 56.3  ml 31.24 ml/m  AORTIC VALVE AV Area (Vmax):    1.82 cm AV Area (Vmean):   2.00 cm AV Area (VTI):     2.10 cm AV Vmax:           173.00 cm/s AV Vmean:          103.000 cm/s AV VTI:            0.334 m AV Peak Grad:      12.0 mmHg AV Mean Grad:      5.0  mmHg LVOT Vmax:         124.00 cm/s LVOT Vmean:        80.800 cm/s LVOT VTI:          0.275 m LVOT/AV VTI ratio: 0.82 AI PHT:            393 msec  AORTA Ao Root diam: 2.90 cm MITRAL VALVE                TRICUSPID VALVE MV Area (PHT): 3.66 cm     TR Peak grad:   20.8 mmHg MV Decel Time: 207 msec     TR Vmax:        228.00 cm/s MV E velocity: 84.70 cm/s MV A velocity: 104.00 cm/s  SHUNTS MV E/A ratio:  0.81         Systemic VTI:  0.28 m                             Systemic Diam: 1.80 cm Weston Brass MD Electronically signed by Weston Brass MD Signature Date/Time: 05/07/2023/1:34:09 PM    Final    CT Angio Chest/Abd/Pel for Dissection W and/or Wo Contrast Result Date: 05/06/2023 CLINICAL DATA:  Acute aortic syndrome suspected based on findings on CTA head and neck. Altered mental status. EXAM: CT ANGIOGRAPHY CHEST, ABDOMEN AND PELVIS TECHNIQUE: Multidetector CT imaging through the chest, abdomen and pelvis was performed using the standard protocol during bolus administration of intravenous contrast. Multiplanar reconstructed images and MIPs were obtained and reviewed to evaluate the vascular anatomy. RADIATION DOSE REDUCTION: This exam was performed according to the departmental dose-optimization program which includes automated exposure control, adjustment of the mA and/or kV according to patient size and/or use of iterative reconstruction technique. CONTRAST:  80mL OMNIPAQUE IOHEXOL 350 MG/ML SOLN COMPARISON:  Same day CTA head and neck; chest radiograph 05/06/2023; CT chest abdomen pelvis without IV contrast 05/06/2023 FINDINGS: CTA CHEST FINDINGS Cardiovascular: Intraluminal filling defect in the aortic arch measuring 3.4 x 1.4 x 2.2 cm. Aortic root and  ascending aorta are partially obscured by motion artifact. No definite dissection flap. No aneurysm. There are additional small filling defects in the descending aorta measuring 4 mm (5/44 and 5/76). The aortic arch branch vessels widely patent. Normal heart size. No pericardial effusion. No central pulmonary embolism. Mediastinum/Nodes: Bowing of the posterior trachea compatible with expiratory phase. Thyroid and esophagus are unremarkable. No thoracic adenopathy. Lungs/Pleura: Hypoventilation changes in the lungs. No focal consolidation, pleural effusion, or pneumothorax. Musculoskeletal: No acute fracture. Review of the MIP images confirms the above findings. CTA ABDOMEN AND PELVIS FINDINGS VASCULAR Aorta: Normal caliber abdominal aorta without dissection. Mild aortic atherosclerotic calcification. No intraluminal filling defects. Celiac: Patent without aneurysm or dissection. SMA: Patent without aneurysm or dissection. Renals: Patent without aneurysm or dissection. IMA: Patent. Inflow: Patent without aneurysm or dissection. Veins: No obvious venous abnormality within the limitations of this arterial phase study. Review of the MIP images confirms the above findings. NON-VASCULAR Hepatobiliary: No acute abnormality. Pancreas: Unremarkable. Spleen: Unremarkable. Adrenals/Urinary Tract: Normal adrenal glands. Wedge-shaped filling defect in the inferior pole of the right kidney (series 9/image 109) is favored to represent a chronic scar though infarct could appear similarly. Stomach/Bowel: No bowel obstruction or bowel wall thickening. Stomach and appendix are within normal limits. Lymphatic: No lymphadenopathy. Reproductive: No acute abnormality. Other: No free intraperitoneal fluid or air. Musculoskeletal: No acute fracture.  Posterior fusion L3-L5. Review of the MIP images confirms  the above findings. IMPRESSION: 1. 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 Additional small  filling defects in the descending aorta measuring 4 mm. No definite dissection flap. No aneurysm. 2. Wedge-shaped filling defect in the inferior pole of the right kidney is favored to represent a chronic scar though infarct could appear similarly. Aortic Atherosclerosis (ICD10-I70.0). Electronically Signed   By: Minerva Fester M.D.   On: 05/06/2023 20:35   MR BRAIN WO CONTRAST Result Date: 05/06/2023 CLINICAL DATA:  Altered mental status EXAM: MRI HEAD WITHOUT CONTRAST TECHNIQUE: Multiplanar, multiecho pulse sequences of the brain and surrounding structures were obtained without intravenous contrast. COMPARISON:  CTA head neck 05/06/2023 FINDINGS: Brain: There is multifocal bilateral acute ischemia, greatest in the left parietal and occipital lobe, but also present in the posterior right hemisphere. No acute or chronic hemorrhage. There is multifocal hyperintense T2-weighted signal within the white matter. Generalized volume loss. The midline structures are normal. Vascular: Normal flow voids. Skull and upper cervical spine: Normal calvarium and skull base. Visualized upper cervical spine and soft tissues are normal. Sinuses/Orbits:Left mastoid effusion. Paranasal sinuses are clear. Normal orbits. IMPRESSION: Multifocal bilateral acute ischemia, greatest in the left parietal and occipital lobe, but also present in the posterior right hemisphere. No hemorrhage or mass effect. Electronically Signed   By: Deatra Robinson M.D.   On: 05/06/2023 20:25   CT ANGIO HEAD NECK W WO CM Result Date: 05/06/2023 CLINICAL DATA:  Acute neurologic deficit EXAM: CT ANGIOGRAPHY HEAD AND NECK WITH AND WITHOUT CONTRAST TECHNIQUE: Multidetector CT imaging of the head and neck was performed using the standard protocol during bolus administration of intravenous contrast. Multiplanar CT image reconstructions and MIPs were obtained to evaluate the vascular anatomy. Carotid stenosis measurements (when applicable) are obtained utilizing  NASCET criteria, using the distal internal carotid diameter as the denominator. RADIATION DOSE REDUCTION: This exam was performed according to the departmental dose-optimization program which includes automated exposure control, adjustment of the mA and/or kV according to patient size and/or use of iterative reconstruction technique. CONTRAST:  75mL OMNIPAQUE IOHEXOL 350 MG/ML SOLN COMPARISON:  None Available. FINDINGS: CTA NECK FINDINGS Skeleton: No acute abnormality or high grade bony spinal canal stenosis. Other neck: Normal pharynx, larynx and major salivary glands. No cervical lymphadenopathy. Unremarkable thyroid gland. Upper chest: No pneumothorax or pleural effusion. No nodules or masses. Aortic arch: There is no calcific atherosclerosis of the aortic arch. There is a large intraluminal filling defect within the aortic arch, consistent with thromboembolism RIGHT carotid system: Normal without aneurysm, dissection or stenosis. LEFT carotid system: Normal without aneurysm, dissection or stenosis. Vertebral arteries: Codominant configuration. There is no dissection, occlusion or flow-limiting stenosis to the skull base (V1-V3 segments). CTA HEAD FINDINGS POSTERIOR CIRCULATION: Vertebral arteries are normal. No proximal occlusion of the anterior or inferior cerebellar arteries. Basilar artery is normal. Superior cerebellar arteries are normal. Posterior cerebral arteries are normal. ANTERIOR CIRCULATION: Intracranial internal carotid arteries are normal. Anterior cerebral arteries are normal. Middle cerebral arteries are normal. Venous sinuses: As permitted by contrast timing, patent. Anatomic variants: Fetal origin of the right posterior cerebral artery. Review of the MIP images confirms the above findings. IMPRESSION: 1. Large intraluminal filling defect within the aortic arch, consistent with thromboembolism. An aortic CTA is recommended. 2. No emergent large vessel occlusion or high-grade stenosis of the  intracranial arteries. Electronically Signed   By: Deatra Robinson M.D.   On: 05/06/2023 20:19   DG Chest [facility] 1 View Result  Date: 05/06/2023 CLINICAL DATA:  Sepsis presentation.  Mental status changes. EXAM: PORTABLE CHEST 1 VIEW COMPARISON:  02/15/2023. FINDINGS: Allowing for the AP technique and poor inspiration, there is probably mild cardiomegaly. There may be mild pulmonary venous hypertension but there is no frank edema, infiltrate, collapse or effusion. IMPRESSION: Poor inspiration. Probable mild cardiomegaly and mild pulmonary venous hypertension. No frank edema, infiltrate or collapse. Electronically Signed   By: Paulina Fusi M.D.   On: 05/06/2023 17:34   CT CHEST ABDOMEN PELVIS WO CONTRAST Result Date: 05/06/2023 CLINICAL DATA:  Altered level of consciousness, sepsis EXAM: CT CHEST, ABDOMEN AND PELVIS WITHOUT CONTRAST TECHNIQUE: Multidetector CT imaging of the chest, abdomen and pelvis was performed following the standard protocol without IV contrast. Examination was performed without contrast at the request of the ordering clinician. Evaluation of the soft tissues, solid viscera, and vascular structures is limited. RADIATION DOSE REDUCTION: This exam was performed according to the departmental dose-optimization program which includes automated exposure control, adjustment of the mA and/or kV according to patient size and/or use of iterative reconstruction technique. COMPARISON:  07/26/2022 FINDINGS: CT CHEST FINDINGS Cardiovascular: Unenhanced imaging of the heart demonstrates mild cardiomegaly without pericardial effusion. Normal caliber of the thoracic aorta. Evaluation of the vascular lumen is limited without IV contrast. Mediastinum/Nodes: No enlarged mediastinal, hilar, or axillary lymph nodes. Thyroid gland, trachea, and esophagus demonstrate no significant findings. Lungs/Pleura: No acute airspace disease, effusion, or pneumothorax. Central airways are patent. Musculoskeletal: No acute or  destructive bony abnormalities. Reconstructed images demonstrate no additional findings. CT ABDOMEN PELVIS FINDINGS Hepatobiliary: Unremarkable unenhanced appearance of the liver and gallbladder. Pancreas: Unremarkable unenhanced appearance. Spleen: Unremarkable unenhanced appearance. Adrenals/Urinary Tract: No urinary tract calculi or obstructive uropathy. The adrenals and bladder are unremarkable. Stomach/Bowel: No bowel obstruction or ileus. Normal appendix right lower quadrant. No bowel wall thickening or inflammatory change. Vascular/Lymphatic: Aortic atherosclerosis. No enlarged abdominal or pelvic lymph nodes. Reproductive: Uterus is lobular and slightly enlarged consistent with underlying fibroids. No adnexal masses. Other: No free fluid or free intraperitoneal gas. No abdominal wall hernia. Musculoskeletal: No acute or destructive bony abnormalities. Postsurgical changes from L3 through L5. Reconstructed images demonstrate no additional findings. IMPRESSION: 1. No acute intrathoracic, intra-abdominal, or intrapelvic process on this unenhanced exam. 2. Cardiomegaly. 3. Fibroid uterus. 4.  Aortic Atherosclerosis (ICD10-I70.0). Electronically Signed   By: Sharlet Salina M.D.   On: 05/06/2023 17:33   CT HEAD WO CONTRAST Result Date: 05/06/2023 CLINICAL DATA:  Altered level of consciousness EXAM: CT HEAD WITHOUT CONTRAST TECHNIQUE: Contiguous axial images were obtained from the base of the skull through the vertex without intravenous contrast. RADIATION DOSE REDUCTION: This exam was performed according to the departmental dose-optimization program which includes automated exposure control, adjustment of the mA and/or kV according to patient size and/or use of iterative reconstruction technique. COMPARISON:  12/11/2022 FINDINGS: Brain: No acute infarct or hemorrhage. Lateral ventricles and midline structures are unremarkable. No acute extra-axial fluid collections. No mass effect. Vascular: No hyperdense  vessel or unexpected calcification. Skull: Normal. Negative for fracture or focal lesion. Sinuses/Orbits: No acute finding. Other: None. IMPRESSION: 1. No acute intracranial process. Electronically Signed   By: Sharlet Salina M.D.   On: 05/06/2023 17:28        Scheduled Meds:  atorvastatin  40 mg Oral Daily   cyanocobalamin  1,000 mcg Intramuscular Q Mon   folic acid  1 mg Oral Daily   multivitamin with minerals  1 tablet Oral Daily   oseltamivir  30 mg  Oral BID   [START ON 05/15/2023] thiamine (VITAMIN B1) injection  100 mg Intravenous Daily   Continuous Infusions:  heparin 900 Units/hr (05/08/23 1022)   thiamine (VITAMIN B1) injection 500 mg (05/08/23 0557)   Followed by   Melene Muller ON 05/09/2023] thiamine (VITAMIN B1) injection       LOS: 1 day    Time spent: 40 minutes    Dorcas Carrow, MD [facility]

## 2023-05-08 NOTE — Evaluation (Signed)
Speech Language Pathology Evaluation Patient Details Name: Kathryn [name] MRN: [id number] DOB: 04/21/1970 Today's Date: 05/08/2023 Time: 7829-5621 SLP Time Calculation (min) (ACUTE ONLY): 21 min  Problem List:  Patient Active Problem List   Diagnosis Date Noted   Acute embolic stroke (HCC) 05/07/2023   Aortic thrombus (HCC) 05/07/2023   Acute encephalopathy 05/07/2023   Cocaine abuse (HCC) 05/07/2023   Influenza A 05/07/2023   Past Medical History: No past medical history on file. Past Surgical History: The histories are not reviewed yet. Please review them in the "History" navigator section and refresh this SmartLink. HPI:  Kathryn [name] is a 53 y.o. female with medical history significant of COPD, HTN, substance abuse. Pt sleeping all day today so family brought pt to ED. MRI multifocal bilateral acute ischemia, greatest in the left parietal and occipital lobe, but also present in the posterior right  hemisphere   Assessment / Plan / Recommendation Clinical Impression  Pt demonstrates mild cognitive impairments as assessed on Cognistat in the areas of attention, memory and calculations. She demonstrated functional basic problem solving as she utilized her call bell independently when she needed the toilet. ST will continue to see to address above areas and suspect may be short in duration while here on acute. ST will assess her reading/visual scanning and writing. Her speech is functional and intelligible and no deficits in language abilities. She completed the 10th grade, lives alone and stated she doesn't have a permanent address at the moment.    SLP Assessment  SLP Recommendation/Assessment: Patient needs continued Speech Lanaguage Pathology Services SLP Visit Diagnosis: Cognitive communication deficit (R41.841)    Recommendations for follow up therapy are one component of a multi-disciplinary discharge planning process, led by the attending physician.  Recommendations may be updated  based on patient status, additional functional criteria and insurance authorization.    Follow Up Recommendations  Acute inpatient rehab (3hours/day)    Assistance Recommended at Discharge  Intermittent Supervision/Assistance  Functional Status Assessment Patient has had a recent decline in their functional status and demonstrates the ability to make significant improvements in function in a reasonable and predictable amount of time.  Frequency and Duration min 2x/week  2 weeks      SLP Evaluation Cognition  Overall Cognitive Status: Impaired/Different from baseline Arousal/Alertness: Awake/alert Orientation Level: Oriented to person;Oriented to place;Oriented to situation (disoriented to date and day of week) Year: 2025 Month: January Day of Week: Incorrect Attention: Sustained Sustained Attention: Impaired Sustained Attention Impairment: Verbal basic Memory: Impaired (scored in mild impairment) Memory Impairment: Retrieval deficit;Storage deficit Awareness: Appears intact Problem Solving: Appears intact (used call bell independently and scored WNL on subtest) Safety/Judgment: Appears intact       Comprehension  Auditory Comprehension Overall Auditory Comprehension: Appears within functional limits for tasks assessed Visual Recognition/Discrimination Discrimination: Not tested Reading Comprehension Reading Status: Not tested    Expression Expression Primary Mode of Expression: Verbal Verbal Expression Overall Verbal Expression: Appears within functional limits for tasks assessed Initiation: No impairment Level of Generative/Spontaneous Verbalization: Conversation Repetition: No impairment Naming: No impairment Pragmatics: No impairment Written Expression Written Expression: Not tested   Oral / Motor  Oral Motor/Sensory Function Overall Oral Motor/Sensory Function: Mild impairment (right side of body is weakest however left side lips appear weak) Facial ROM:  Reduced left;Suspected CN VII (facial) dysfunction Facial Symmetry: Within Functional Limits Lingual ROM: Within Functional Limits Lingual Symmetry: Within Functional Limits Motor Speech Overall Motor Speech: Appears within functional limits for tasks assessed Respiration: Within functional limits  Phonation: Normal Resonance: Within functional limits Intelligibility: Intelligible Motor Planning: Witnin functional limits            Royce Macadamia 05/08/2023, 12:06 PM

## 2023-05-08 NOTE — Progress Notes (Signed)
  Inpatient Rehab Admissions Coordinator :  Per therapy recommendations, patient was screened for CIR candidacy by Ottie Glazier RN MSN. Noted patient is unhoused and with cognitive deficits.   Possible OR pending. Unless caregiver supports can be arranged for discharge, patient will need SNF. We will not place a rehab consult at this time. Other rehab venues should be pursued. Please call me with any questions.  Ottie Glazier RN MSN Admissions Coordinator [phone]

## 2023-05-08 NOTE — Progress Notes (Signed)
LTM EEG discontinued - no skin breakdown at unhook.   

## 2023-05-08 NOTE — Progress Notes (Signed)
PT Cancellation Note  Patient Details Name: Kathryn [name] MRN: [id number] DOB: 01/12/1971   Cancelled Treatment:    Reason Eval/Treat Not Completed: Patient not medically ready;Active bedrest order  Noted remains on bedrest. ?plan for surgical intervention Wed per CTS note. Will continue to monitor for appropriateness post-procedure   Jerolyn Center, PT Acute Rehabilitation Services  Office [phone]  Zena Amos 05/08/2023, 8:39 AM

## 2023-05-08 NOTE — Procedures (Signed)
Patient Name: Kathryn [name]  MRN: [id number]  Epilepsy Attending: Charlsie Quest  Referring Physician/Provider: Erick Blinks, MD  Duration: 05/07/2023 4098 to 05/08/2023 1191  Patient history: 53yo F with stroke getting eeg to evaluate for seizure  Level of alertness: Awake, asleep  AEDs during EEG study: None  Technical aspects: This EEG study was done with scalp electrodes positioned according to the 10-20 International system of electrode placement. Electrical activity was reviewed with band pass filter of 1-70Hz , sensitivity of 7 uV/mm, display speed of 72mm/sec with a 60Hz  notched filter applied as appropriate. EEG data were recorded continuously and digitally stored.  Video monitoring was available and reviewed as appropriate.  Description: The posterior dominant rhythm consists of 8 Hz activity of moderate voltage (25-35 uV) seen predominantly in posterior head regions, symmetric and reactive to eye opening and eye closing. Sleep was characterized by vertex waves, sleep spindles (12 to 14 Hz), maximal frontocentral region. EEG showed intermittent generalized and maximal left temporal region 3 to 6 Hz theta-delta slowing.  Hyperventilation and photic stimulation were not performed.     ABNORMALITY - Intermittent slow, generalized and maximal left temporal  IMPRESSION: This study is suggestive of cortical dysfunction in left temporal region likely secondary to underlying structural abnormality.  Additionally there is mild diffuse encephalopathy.  No seizures or epileptiform discharges were seen throughout the recording.  Mikaya Bunner Annabelle Harman

## 2023-05-08 NOTE — TOC CAGE-AID Note (Signed)
Transition of Care [facility]) - CAGE-AID Screening   Patient Details  Name: Kathryn [name] MRN: [id number] Date of Birth: Dec 27, 1970  Transition of Care [facility] CM/SW Contact:    Kermit Balo, RN Phone Number: 05/08/2023, 3:52 PM   Clinical Narrative:  CM provided her resources for inpatient/ outpatient drug counseling.  CAGE-AID Screening:

## 2023-05-08 NOTE — Progress Notes (Signed)
     [address]       [place] [address]             [phone]       Discussed issues with pt and she continues to not be able to remember important aspects of the risks of surgery that need to be fully understood prior to surgery. Discussed some of this with her friend but unable to get in contact with daughter or sister. Discussed this with Neurology and they understand the high risk nature of procedure and that time may help in her encephalopathy. In meantime will obtain cardiac risk assesment and keep on anticoagulation until repeat CTA of chest next weekend. If still present then will proceed with thrombectomy under circulatory arrest next Friday.

## 2023-05-08 NOTE — Progress Notes (Signed)
PHARMACY - ANTICOAGULATION CONSULT NOTE  Pharmacy Consult for heparin Indication:  aortic thrombus  Not on File  Patient Measurements: Height: 5\' 2"  (157.5 cm) Weight: 79 kg (174 lb 1.6 oz) IBW/kg (Calculated) : 50.1 Heparin Dosing Weight: 67.5 kg  Vital Signs: Temp: 99.2 F (37.3 C) (01/21 1119) Temp Source: Oral (01/21 1119) BP: 135/65 (01/21 1119) Pulse Rate: 76 (01/21 1119)  Labs: Recent Labs    05/06/23 1602 05/06/23 1605 05/06/23 2213 05/06/23 2347 05/07/23 0343 05/07/23 1024 05/07/23 1320 05/07/23 1457 05/07/23 2314 05/08/23 0833  HGB 14.6  --   --   --  12.4 14.3  --   --   --  12.1  HCT 43.0  --   --   --  37.3 42.9  --   --   --  36.9  PLT  --   --   --   --  381 168  --   --   --  462*  APTT  --   --  31  --   --   --   --   --   --   --   LABPROT  --   --   --  15.6*  --   --   --   --   --   --   INR  --   --   --  1.2  --   --   --   --   --   --   HEPARINUNFRC  --   --   --  0.14*  --   --    < > 0.25* 0.50 0.53  CREATININE 1.30* 1.33*  --   --   --   --   --   --   --   --    < > = values in this interval not displayed.    Estimated Creatinine Clearance: 47.1 mL/min (A) (by C-G formula based on SCr of 1.33 mg/dL (H)).  Medical History: No past medical history on file.  Assessment: 53 y/o female presenting with altered mental status. PMH unknown. CT imaging shows Large intraluminal filling defect within the aortic arch, consistent with thromboembolism. Pharmacy has been consulted to initiate heparin infusion. Per chart review, patient is not on anticoagulation prior to admission.  Per neurology, will initiate infusion without bolus and target lower heparin level goal (0.3-0.5) due to multiple embolic CVA.  HL slightly above goal at 0.53. CBC stable.   Goal of Therapy:  Heparin level 0.3-0.5 units/ml Monitor platelets by anticoagulation protocol: Yes   Plan:  Decrease heparin infusion to 900 units/hr Check anti-Xa level in 8 hours and daily  while on heparin Continue to monitor H&H and platelets F/u oral AC  Ulyses Southward, PharmD, BCIDP, AAHIVP, CPP Infectious Disease Pharmacist 05/08/2023 11:59 AM

## 2023-05-08 NOTE — Evaluation (Signed)
Occupational Therapy Evaluation Patient Details Name: Kathryn [name] MRN: [id number] DOB: [birth year] Today's Date: 05/08/2023   History of Present Illness 53 y.o. female adm 1/19 with medical history significant of COPD, HTN, substance abuse.     Pt LKW 2300 on 1/18.     Pt sleeping all day today so family brought pt to ED.     MRI bain demonstrates embolic infarcts.     CTA = no LVO     Work up ultimately demonstrated the very likely source to be a aortic arch thrombus.  Scheduled for surgery 1/22.   Clinical Impression   Patient admitted for the diagnosis above.  PTA she was living with friends at their rental home.  The rental agreement is up February 1st, and she states they are moving out, and she is unclear where she will be staying after the hospital.  Patient requesting bathroom use.  Bedrest order discharge by MD.  Patient is presenting with cognitive deficits impacting independence with ADL and in room mobility.  Patient is CGA to supervision for in room mobility and ADL completion, she is needing a lot of cognitive cues.  AIR has been recommended by ST.  OT will follow in the acute setting to address deficits.  OT will reassess post surgery to determine if her physical impairments will match her cognitive deficits, and refine discharge recommendations as needed.         If plan is discharge home, recommend the following: Assist for transportation;Assistance with cooking/housework;Supervision due to cognitive status;A little help with walking and/or transfers;A little help with bathing/dressing/bathroom;Direct supervision/assist for financial management;Direct supervision/assist for medications management    Functional Status Assessment  Patient has had a recent decline in their functional status and demonstrates the ability to make significant improvements in function in a reasonable and predictable amount of time.  Equipment Recommendations  None recommended by OT    Recommendations  for Other Services       Precautions / Restrictions Precautions Precautions: Fall Restrictions Weight Bearing Restrictions Per Provider Order: No      Mobility Bed Mobility Overal bed mobility: Needs Assistance Bed Mobility: Supine to Sit     Supine to sit: Supervision          Transfers Overall transfer level: Needs assistance Equipment used: None Transfers: Sit to/from Stand, Bed to chair/wheelchair/BSC Sit to Stand: Supervision     Step pivot transfers: Supervision, Contact guard assist     General transfer comment: narrow BOS with short choppy steps.  Slowed pace.      Balance Overall balance assessment: Needs assistance Sitting-balance support: Feet supported Sitting balance-Leahy Scale: Good     Standing balance support: No upper extremity supported Standing balance-Leahy Scale: Fair                             ADL either performed or assessed with clinical judgement   ADL       Grooming: Wash/dry hands;Wash/dry face;Set up;Sitting           Upper Body Dressing : Sitting;Supervision/safety   Lower Body Dressing: Contact guard assist;Sit to/from stand   Toilet Transfer: Contact guard Actor;Ambulation                   Vision Patient Visual Report: No change from baseline       Perception Perception: Not tested       Praxis Praxis: Not tested  Pertinent Vitals/Pain Pain Assessment Pain Assessment: Faces Faces Pain Scale: Hurts a little bit Pain Location: Head Pain Descriptors / Indicators: Headache Pain Intervention(s): Monitored during session     Extremity/Trunk Assessment Upper Extremity Assessment Upper Extremity Assessment: Overall WFL for tasks assessed   Lower Extremity Assessment Lower Extremity Assessment: Defer to PT evaluation   Cervical / Trunk Assessment Cervical / Trunk Assessment: Normal   Communication Communication Communication: No apparent difficulties    Cognition Arousal: Alert Behavior During Therapy: WFL for tasks assessed/performed Overall Cognitive Status: Difficult to assess Area of Impairment: Orientation, Following commands, Safety/judgement, Awareness, Problem solving                 Orientation Level: Time, Situation     Following Commands: Follows one step commands with increased time Safety/Judgement: Decreased awareness of deficits Awareness: Intellectual Problem Solving: Slow processing, Requires verbal cues       General Comments   VSS on RA    Exercises     Shoulder Instructions      Home Living Family/patient expects to be discharged to:: Private residence Living Arrangements: Non-relatives/Friends Available Help at Discharge: Family;Available PRN/intermittently Type of Home: House (Rental house, rented by others, she was staying there.  Rental ends 05/19/2023, unsure where she will be living.) Home Access: Level entry     Home Layout: One level     Bathroom Shower/Tub: Chief Strategy Officer: Standard     Home Equipment: None      Lives With: Other (Comment)    Prior Functioning/Environment Prior Level of Function : Independent/Modified Independent                        OT Problem List: Impaired balance (sitting and/or standing);Decreased safety awareness;Decreased cognition;Pain      OT Treatment/Interventions: Self-care/ADL training;Therapeutic activities;Cognitive remediation/compensation;Patient/family education;DME and/or AE instruction;Balance training    OT Goals(Current goals can be found in the care plan section) Acute Rehab OT Goals Patient Stated Goal: Return home OT Goal Formulation: With patient Time For Goal Achievement: 05/22/23 Potential to Achieve Goals: Fair ADL Goals Pt Will Perform Grooming: with modified independence;standing Pt Will Perform Lower Body Dressing: with modified independence;sit to/from stand Pt Will Transfer to Toilet: with  modified independence;ambulating;regular height toilet  OT Frequency: Min 1X/week    Co-evaluation              AM-PAC OT "6 Clicks" Daily Activity     Outcome Measure Help from another person eating meals?: None Help from another person taking care of personal grooming?: A Little Help from another person toileting, which includes using toliet, bedpan, or urinal?: A Little Help from another person bathing (including washing, rinsing, drying)?: A Little Help from another person to put on and taking off regular upper body clothing?: A Little Help from another person to put on and taking off regular lower body clothing?: A Little 6 Click Score: 19   End of Session Nurse Communication: Mobility status  Activity Tolerance: Patient tolerated treatment well Patient left: in chair;with call bell/phone within reach;with chair alarm set  OT Visit Diagnosis: Unsteadiness on feet (R26.81);Other symptoms and signs involving cognitive function                Time: 1428-1450 OT Time Calculation (min): 22 min Charges:  OT General Charges $OT Visit: 1 Visit OT Evaluation $OT Eval Moderate Complexity: 1 Mod  05/08/2023  RP, OTR/L  Acute Rehabilitation Services  Office:  [phone]  Tasfia Vasseur D Raigen Jagielski 05/08/2023, 3:46 PM

## 2023-05-08 NOTE — Progress Notes (Signed)
LTM maint complete - no skin breakdown under:  FP1, FP2   

## 2023-05-08 NOTE — Procedures (Signed)
Patient Name: Kathryn [name]  MRN: [id number]  Epilepsy Attending: Charlsie Quest  Referring Physician/Provider: Erick Blinks, MD  Duration: 05/08/2023 8756 to 05/08/2023 1340   Patient history: 53yo F with stroke getting eeg to evaluate for seizure   Level of alertness: Awake, asleep   AEDs during EEG study: None   Technical aspects: This EEG study was done with scalp electrodes positioned according to the 10-20 International system of electrode placement. Electrical activity was reviewed with band pass filter of 1-70Hz , sensitivity of 7 uV/mm, display speed of 41mm/sec with a 60Hz  notched filter applied as appropriate. EEG data were recorded continuously and digitally stored.  Video monitoring was available and reviewed as appropriate.   Description: The posterior dominant rhythm consists of 8 Hz activity of moderate voltage (25-35 uV) seen predominantly in posterior head regions, symmetric and reactive to eye opening and eye closing. Sleep was characterized by vertex waves, sleep spindles (12 to 14 Hz), maximal frontocentral region. EEG showed intermittent generalized and maximal left temporal region 3 to 6 Hz theta-delta slowing.  Hyperventilation and photic stimulation were not performed.      ABNORMALITY - Intermittent slow, generalized and maximal left temporal   IMPRESSION: This study is suggestive of cortical dysfunction in left temporal region likely secondary to underlying structural abnormality.  Additionally there is mild diffuse encephalopathy.  No seizures or epileptiform discharges were seen throughout the recording.   Shamar Engelmann Annabelle Harman

## 2023-05-08 NOTE — TOC Initial Note (Signed)
Transition of Care [facility]) - Initial/Assessment Note    Patient Details  Name: Kathryn [name] MRN: [id number] Date of Birth: [birth year]  Transition of Care [facility]) CM/SW Contact:    Kermit Balo, RN Phone Number: 05/08/2023, 3:49 PM  Clinical Narrative:                  Pt lives with a friend named Punchy. She says she is just staying with him currently and is basically homeless. She says her daughter may let her stay with her after the hospital. She is going to talk with her daughter.  No DME at home.  She denies issues with home medications. She does have issues with transportation. CM encouraged her to call her [employer] to see if she can get transportation services through her insurance.  Current recommendations are for CIR. Awaiting eval.  Pt also needs PCP.  TOC following.  Expected Discharge Plan: IP Rehab Facility Barriers to Discharge: Continued Medical Work up   Patient Goals and CMS Choice   CMS Medicare.gov Compare Post Acute Care list provided to:: Patient Choice offered to / list presented to : Patient      Expected Discharge Plan and Services   Discharge Planning Services: CM Consult Post Acute Care Choice: IP Rehab Living arrangements for the past 2 months: Single Family Home                                      Prior Living Arrangements/Services Living arrangements for the past 2 months: Single Family Home Lives with:: Friends Patient language and need for interpreter reviewed:: Yes Do you feel safe going back to the place where you live?: Yes            Criminal Activity/Legal Involvement Pertinent to Current Situation/Hospitalization: No - Comment as needed  Activities of Daily Living      Permission Sought/Granted                  Emotional Assessment Appearance:: Appears stated age Attitude/Demeanor/Rapport: Engaged Affect (typically observed): Accepting Orientation: : Oriented to Self, Oriented to Place, Oriented to  Time,  Oriented to Situation Alcohol / Substance Use: Illicit Drugs Psych Involvement: No (comment)  Admission diagnosis:  Acute embolic stroke (HCC) [I63.9] Aortic thrombus (HCC) [I74.10] Cerebrovascular accident (CVA), unspecified mechanism (HCC) [I63.9] Patient Active Problem List   Diagnosis Date Noted   Acute embolic stroke (HCC) 05/07/2023   Aortic thrombus (HCC) 05/07/2023   Acute encephalopathy 05/07/2023   Cocaine abuse (HCC) 05/07/2023   Influenza A 05/07/2023   PCP:  Patient, No Pcp Per Pharmacy:  No Pharmacies Listed    Social Drivers of Health (SDOH) Social History:   SDOH Interventions:     Readmission Risk Interventions     No data to display

## 2023-05-09 ENCOUNTER — Inpatient Hospital Stay (HOSPITAL_COMMUNITY): Payer: Medicare HMO

## 2023-05-09 DIAGNOSIS — I2510 Atherosclerotic heart disease of native coronary artery without angina pectoris: Secondary | ICD-10-CM

## 2023-05-09 DIAGNOSIS — I639 Cerebral infarction, unspecified: Secondary | ICD-10-CM | POA: Diagnosis not present

## 2023-05-09 LAB — BASIC METABOLIC PANEL
Anion gap: 8 (ref 5–15)
BUN: 12 mg/dL (ref 6–20)
CO2: 23 mmol/L (ref 22–32)
Calcium: 8.7 mg/dL — ABNORMAL LOW (ref 8.9–10.3)
Chloride: 105 mmol/L (ref 98–111)
Creatinine, Ser: 0.82 mg/dL (ref 0.44–1.00)
GFR, Estimated: >60 mL/min (ref >60)
Glucose, Bld: 92 mg/dL (ref 70–99)
Potassium: 3.7 mmol/L (ref 3.5–5.1)
Sodium: 136 mmol/L (ref 135–145)

## 2023-05-09 LAB — HEPARIN LEVEL (UNFRACTIONATED)
Heparin Unfractionated: 0.19 [IU]/mL — ABNORMAL LOW (ref 0.30–0.70)
Heparin Unfractionated: 0.27 [IU]/mL — ABNORMAL LOW (ref 0.30–0.70)

## 2023-05-09 LAB — CBC
HCT: 35.1 % — ABNORMAL LOW (ref 36.0–46.0)
Hemoglobin: 11.7 g/dL — ABNORMAL LOW (ref 12.0–15.0)
MCH: 29.3 pg (ref 26.0–34.0)
MCHC: 33.3 g/dL (ref 30.0–36.0)
MCV: 87.8 fL (ref 80.0–100.0)
Platelets: 417 10*3/uL — ABNORMAL HIGH (ref 150–400)
RBC: 4 MIL/uL (ref 3.87–5.11)
RDW: 15.4 % (ref 11.5–15.5)
WBC: 6.2 10*3/uL (ref 4.0–10.5)
nRBC: 0 % (ref 0.0–0.2)

## 2023-05-09 MED ORDER — NITROGLYCERIN 0.4 MG SL SUBL
0.8000 mg | SUBLINGUAL_TABLET | Freq: Once | SUBLINGUAL | Status: AC
  Administered 2023-05-09: 0.8 mg via SUBLINGUAL

## 2023-05-09 MED ORDER — ALBUTEROL SULFATE (2.5 MG/3ML) 0.083% IN NEBU
2.5000 mg | INHALATION_SOLUTION | Freq: Four times a day (QID) | RESPIRATORY_TRACT | Status: DC | PRN
  Administered 2023-05-15: 2.5 mg via RESPIRATORY_TRACT
  Filled 2023-05-09: qty 3

## 2023-05-09 MED ORDER — PREGABALIN 100 MG PO CAPS
100.0000 mg | ORAL_CAPSULE | Freq: Two times a day (BID) | ORAL | Status: DC
  Administered 2023-05-09 – 2023-05-18 (×19): 100 mg via ORAL
  Filled 2023-05-09 (×21): qty 1

## 2023-05-09 MED ORDER — CITALOPRAM HYDROBROMIDE 10 MG PO TABS
20.0000 mg | ORAL_TABLET | Freq: Every day | ORAL | Status: DC
  Administered 2023-05-09 – 2023-05-18 (×10): 20 mg via ORAL
  Filled 2023-05-09 (×10): qty 2

## 2023-05-09 MED ORDER — OSELTAMIVIR PHOSPHATE 75 MG PO CAPS
75.0000 mg | ORAL_CAPSULE | Freq: Two times a day (BID) | ORAL | Status: AC
  Administered 2023-05-09 – 2023-05-11 (×5): 75 mg via ORAL
  Filled 2023-05-09 (×5): qty 1

## 2023-05-09 MED ORDER — NITROGLYCERIN 0.4 MG SL SUBL
SUBLINGUAL_TABLET | SUBLINGUAL | Status: AC
  Filled 2023-05-09: qty 2

## 2023-05-09 MED ORDER — IOHEXOL 350 MG/ML SOLN
95.0000 mL | Freq: Once | INTRAVENOUS | Status: AC | PRN
  Administered 2023-05-09: 95 mL via INTRAVENOUS

## 2023-05-09 MED ORDER — LORAZEPAM 1 MG PO TABS
1.0000 mg | ORAL_TABLET | ORAL | Status: AC | PRN
  Administered 2023-05-09: 2 mg via ORAL
  Filled 2023-05-09: qty 2

## 2023-05-09 MED ORDER — ALBUTEROL SULFATE HFA 108 (90 BASE) MCG/ACT IN AERS: 1.0000 | INHALATION_SPRAY | Freq: Four times a day (QID) | RESPIRATORY_TRACT | Status: DC | PRN

## 2023-05-09 MED ORDER — OSELTAMIVIR PHOSPHATE 30 MG PO CAPS
30.0000 mg | ORAL_CAPSULE | Freq: Once | ORAL | Status: AC
  Administered 2023-05-09: 30 mg via ORAL
  Filled 2023-05-09: qty 1

## 2023-05-09 MED ORDER — QUETIAPINE FUMARATE 200 MG PO TABS
300.0000 mg | ORAL_TABLET | Freq: Every day | ORAL | Status: DC
  Administered 2023-05-09 – 2023-05-17 (×9): 300 mg via ORAL
  Filled 2023-05-09 (×12): qty 1

## 2023-05-09 MED ORDER — PANTOPRAZOLE SODIUM 40 MG PO TBEC
40.0000 mg | DELAYED_RELEASE_TABLET | Freq: Every day | ORAL | Status: DC
  Administered 2023-05-09 – 2023-05-18 (×10): 40 mg via ORAL
  Filled 2023-05-09 (×10): qty 1

## 2023-05-09 MED ORDER — FLUTICASONE FUROATE-VILANTEROL 200-25 MCG/ACT IN AEPB
2.0000 | INHALATION_SPRAY | Freq: Two times a day (BID) | RESPIRATORY_TRACT | Status: DC
  Filled 2023-05-09: qty 28

## 2023-05-09 MED ORDER — LORAZEPAM 2 MG/ML IJ SOLN: 1.0000 mg | INTRAMUSCULAR | Status: AC | PRN

## 2023-05-09 MED ORDER — FLUTICASONE FUROATE-VILANTEROL 200-25 MCG/ACT IN AEPB
1.0000 | INHALATION_SPRAY | Freq: Every day | RESPIRATORY_TRACT | Status: DC
  Administered 2023-05-10 – 2023-05-18 (×8): 1 via RESPIRATORY_TRACT
  Filled 2023-05-09: qty 28

## 2023-05-09 NOTE — Progress Notes (Signed)
PROGRESS NOTE    Kathryn [name]  ZOX:[id number] DOB: [birth year] DOA: 05/06/2023 PCP: Patient, No Pcp Per    Brief Narrative:  53 year old with history of COPD, hypertension, substance abuse, psychiatric disorder presented with being excessively lethargic at home.  Patient was sleeping at home all day so her daughter called EMS.  In the emergency room initially agitated then sleepy after Ativan.  MRI brain consistent with multiple embolic infarcts. CTA without large vessel occlusion however it showed large thrombus at the aortic arch. CT angiogram of the aorta with above, no dissection.  Admitted with neurology and CT surgery consultation.  Subjective:  Patient seen and examined.  No overnight events.  She had slight anxiety episodes overnight.  Her mental health medications has been on hold. Patient tells me that she feels weak otherwise no other issues.   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 Clinical findings, lethargic in the context of stroke, influenza A. CT head findings, no acute findings. MRI of the brain, multifocal bilateral ischemia.  No pressure effect. CTA of the head and neck with no large vessel occlusion.  Large intraluminal filling defect within the aortic arch. CT angiogram aorta, chest abdomen pelvis with no aortic dissection but large thrombus load on ascending aorta. 2D echocardiogram, normal EF.  No evidence of intracardiac thrombus. Antiplatelet therapy, none at home.  Currently tolerating heparin. LDL 91. Hemoglobin A1c, 5-7. Followed by neurology.  Acute metabolic encephalopathy: Likely multifactorial.  Stroke, influenza, drugs.  Mental status improving.  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  Primary cause unknown.  Active cocaine use.  On IV heparin. CT surgery following, Continue heparin today.  She will need lifelong anticoagulation after  the procedure. Thrombectomy, timing pending.  CT surgery asked for cardiac clearance.  CT cardiac pending.    Smoker and cocaine use: Nicotine provided.  Not ready to get adequate counseling.  Alcoholism: Drinks alcohol 1/5 of tequila daily along with a beer.  CIWA protocol.  Multivitamins.  Stable so far.  Influenza A: Positive for flu a PCR.  Tamiflu for 5 days.  AKI: Treated with IV fluid and improved.  Bipolar disorder: Patient on Celexa, Lyrica, Seroquel at home.  Resume today.  Essential hypertension: Blood pressure stable.  Holding antihypertensives for permissive hypertension.  DVT prophylaxis: Heparin infusion   Code Status: Full code Family Communication: None at the bedside Disposition Plan: Status is: Inpatient Remains inpatient appropriate because: Stroke workup, inpatient procedures planned     Consultants:  CT surgery Neurology  Procedures:  EEG  Antimicrobials:  Tamiflu 1/20---     Objective: Vitals:   05/08/23 2324 05/09/23 0310 05/09/23 0818 05/09/23 0916  BP: 119/74 136/80 109/68 132/79  Pulse: 68 72 72   Resp: 18 18 18    Temp: 98.6 F (37 C) 98.4 F (36.9 C) 98.5 F (36.9 C)   TempSrc: Oral Oral Oral   SpO2: 100% 98% 100%   Weight:      Height:        Intake/Output Summary (Last 24 hours) at 05/09/2023 1113 Last data filed at 05/08/2023 2038 Gross per 24 hour  Intake 1209.32 ml  Output --  Net 1209.32 ml   Filed Weights   05/06/23 2031  Weight: 79 kg    Examination:  General exam: Calm and comfortable.  Interactive today. Respiratory system: Clear to auscultation. Respiratory effort normal. Cardiovascular system: S1 & S2 heard, RRR. No  JVD, murmurs, rubs, gallops or clicks. No pedal edema. Gastrointestinal system: Abdomen is nondistended, soft and nontender. No organomegaly or masses felt. Normal bowel sounds heard. Central nervous system: Alert awake and mostly oriented.  No focal neurodeficits.     Data Reviewed: I have  personally reviewed following labs and imaging studies  CBC: Recent Labs  Lab 05/06/23 1602 05/07/23 0343 05/07/23 1024 05/08/23 0833 05/09/23 0543  WBC  --  6.7 6.2 8.7 6.2  HGB 14.6 12.4 14.3 12.1 11.7*  HCT 43.0 37.3 42.9 36.9 35.1*  MCV  --  88.6 87.6 88.7 87.8  PLT  --  381 168 462* 417*   Basic Metabolic Panel: Recent Labs  Lab 05/06/23 1602 05/06/23 1605 05/09/23 0543  NA 140 137 136  K 4.4 3.9 3.7  CL 106 105 105  CO2  --  23 23  GLUCOSE 84 80 92  BUN 19 16 12   CREATININE 1.30* 1.33* 0.82  CALCIUM  --  9.1 8.7*  MG  --  2.3  --    GFR: Estimated Creatinine Clearance: 76.4 mL/min (by C-G formula based on SCr of 0.82 mg/dL). Liver Function Tests: Recent Labs  Lab 05/06/23 1605  AST 35  ALT 20  ALKPHOS 88  BILITOT 0.5  PROT 8.4*  ALBUMIN 3.9   No results for input(s): "LIPASE", "AMYLASE" in the last 168 hours. Recent Labs  Lab 05/06/23 1605 05/07/23 1024  AMMONIA 45* 22   Coagulation Profile: Recent Labs  Lab 05/06/23 2347  INR 1.2   Cardiac Enzymes: No results for input(s): "CKTOTAL", "CKMB", "CKMBINDEX", "TROPONINI" in the last 168 hours. BNP (last 3 results) No results for input(s): "PROBNP" in the last 8760 hours. HbA1C: Recent Labs    05/07/23 0343  HGBA1C 5.7*   CBG: Recent Labs  Lab 05/06/23 1540 05/07/23 1637  GLUCAP 71 84   Lipid Profile: Recent Labs    05/07/23 1233  CHOL 157  HDL 45  LDLCALC 91  TRIG 104  CHOLHDL 3.5   Thyroid Function Tests: Recent Labs    05/06/23 2213 05/06/23 2347  TSH 0.118*  --   FREET4  --  0.85   Anemia Panel: Recent Labs    05/06/23 2213  VITAMINB12 260  FOLATE 17.2   Sepsis Labs: Recent Labs  Lab 05/06/23 1728 05/06/23 1930  LATICACIDVEN 1.5 1.1    Recent Results (from the past 240 hours)  Resp panel by RT-PCR (RSV, Flu A&B, Covid) Anterior Nasal Swab     Status: Abnormal   Collection Time: 05/06/23  4:01 PM   Specimen: Anterior Nasal Swab  Result Value Ref Range  Status   SARS Coronavirus 2 by RT PCR NEGATIVE NEGATIVE Final    Comment: (NOTE) SARS-CoV-2 target nucleic acids are NOT DETECTED.  The SARS-CoV-2 RNA is generally detectable in upper respiratory specimens during the acute phase of infection. The lowest concentration of SARS-CoV-2 viral copies this assay can detect is 138 copies/mL. A negative result does not preclude SARS-Cov-2 infection and should not be used as the sole basis for treatment or other patient management decisions. A negative result may occur with  improper specimen collection/handling, submission of specimen other than nasopharyngeal swab, presence of viral mutation(s) within the areas targeted by this assay, and inadequate number of viral copies(<138 copies/mL). A negative result must be combined with clinical observations, patient history, and epidemiological information. The expected result is Negative.  Fact Sheet for Patients:  BloggerCourse.com  Fact Sheet for Healthcare Providers:  SeriousBroker.it  This test is no t yet approved or cleared by the [place] and  has been authorized for detection and/or diagnosis of SARS-CoV-2 by FDA under an Emergency Use Authorization (EUA). This EUA will remain  in effect (meaning this test can be used) for the duration of the COVID-19 declaration under Section 564(b)(1) of the Act, 21 U.S.C.section 360bbb-3(b)(1), unless the authorization is terminated  or revoked sooner.       Influenza A by PCR POSITIVE (A) NEGATIVE Final   Influenza B by PCR NEGATIVE NEGATIVE Final    Comment: (NOTE) The Xpert Xpress SARS-CoV-2/FLU/RSV plus assay is intended as an aid in the diagnosis of influenza from Nasopharyngeal swab specimens and should not be used as a sole basis for treatment. Nasal washings and aspirates are unacceptable for Xpert Xpress SARS-CoV-2/FLU/RSV testing.  Fact Sheet for  Patients: BloggerCourse.com  Fact Sheet for Healthcare Providers: SeriousBroker.it  This test is not yet approved or cleared by the [place] FDA and has been authorized for detection and/or diagnosis of SARS-CoV-2 by FDA under an Emergency Use Authorization (EUA). This EUA will remain in effect (meaning this test can be used) for the duration of the COVID-19 declaration under Section 564(b)(1) of the Act, 21 U.S.C. section 360bbb-3(b)(1), unless the authorization is terminated or revoked.     Resp Syncytial Virus by PCR NEGATIVE NEGATIVE Final    Comment: (NOTE) Fact Sheet for Patients: BloggerCourse.com  Fact Sheet for Healthcare Providers: SeriousBroker.it  This test is not yet approved or cleared by the [place] FDA and has been authorized for detection and/or diagnosis of SARS-CoV-2 by FDA under an Emergency Use Authorization (EUA). This EUA will remain in effect (meaning this test can be used) for the duration of the COVID-19 declaration under Section 564(b)(1) of the Act, 21 U.S.C. section 360bbb-3(b)(1), unless the authorization is terminated or revoked.  Performed at [facility], [address] [place], [place] [address]   Blood Culture (routine x 2)     Status: None (Preliminary result)   Collection Time: 05/06/23  4:05 PM   Specimen: BLOOD RIGHT ARM  Result Value Ref Range Status   Specimen Description BLOOD RIGHT ARM BLOOD  Final   Special Requests   Final    BOTTLES DRAWN AEROBIC AND ANAEROBIC Blood Culture results may not be optimal due to an inadequate volume of blood received in culture bottles   Culture   Final    NO GROWTH 3 DAYS Performed at [facility], [address] [place], [place] [address]    Report Status PENDING  Incomplete  Blood Culture (routine x 2)     Status: None (Preliminary result)   Collection Time: 05/06/23  7:30 PM    Specimen: BLOOD LEFT HAND  Result Value Ref Range Status   Specimen Description BLOOD LEFT HAND BLOOD  Final   Special Requests AEROBIC BOTTLE ONLY Blood Culture adequate volume  Final   Culture   Final    NO GROWTH 3 DAYS Performed at [facility], [address], [place], [place] [address]    Report Status PENDING  Incomplete         Radiology Studies: Overnight EEG with video Result Date: 05/08/2023 Charlsie Quest, MD     05/08/2023 11:27 AM Patient Name: Jasmarie Door MRN: [id number] Epilepsy Attending: Charlsie Quest Referring Physician/Provider: Erick Blinks, MD Duration: 05/07/2023 2130 to 05/08/2023 8657 Patient history: 53yo F with stroke getting eeg to evaluate for seizure Level of alertness: Awake, asleep AEDs  during EEG study: None Technical aspects: This EEG study was done with scalp electrodes positioned according to the 10-20 International system of electrode placement. Electrical activity was reviewed with band pass filter of 1-70Hz , sensitivity of 7 uV/mm, display speed of 28mm/sec with a 60Hz  notched filter applied as appropriate. EEG data were recorded continuously and digitally stored.  Video monitoring was available and reviewed as appropriate. Description: The posterior dominant rhythm consists of 8 Hz activity of moderate voltage (25-35 uV) seen predominantly in posterior head regions, symmetric and reactive to eye opening and eye closing. Sleep was characterized by vertex waves, sleep spindles (12 to 14 Hz), maximal frontocentral region. EEG showed intermittent generalized and maximal left temporal region 3 to 6 Hz theta-delta slowing.  Hyperventilation and photic stimulation were not performed.   ABNORMALITY - Intermittent slow, generalized and maximal left temporal IMPRESSION: This study is suggestive of cortical dysfunction in left temporal region likely secondary to underlying structural abnormality.  Additionally there is mild diffuse encephalopathy.  No seizures  or epileptiform discharges were seen throughout the recording. Charlsie Quest   ECHOCARDIOGRAM COMPLETE Result Date: 05/07/2023    ECHOCARDIOGRAM REPORT   Patient Name:   YOUSTINA Puig Date of Exam: 05/07/2023 Medical Rec #:  [id number]     Height:       62.0 in Accession #:    [id number]    Weight:       174.1 lb Date of Birth:  09-26-1970      BSA:          1.802 m Patient Age:    53 years      BP:           115/69 mmHg Patient Gender: F             HR:           77 bpm. Exam Location:  Inpatient Procedure: 2D Echo, 3D Echo, Cardiac Doppler and Color Doppler Indications:    Stroke  History:        Patient has no prior history of Echocardiogram examinations.  Sonographer:    Karma Ganja Referring Phys: [phone] JARED M GARDNER  Sonographer Comments: Technically challenging study due to limited acoustic windows and no subcostal window. Image acquisition challenging due to uncooperative patient. IMPRESSIONS  1. Left ventricular ejection fraction, by estimation, is 60 to 65%. The left ventricle has normal function. The left ventricle has no regional wall motion abnormalities. There is mild left ventricular hypertrophy. Left ventricular diastolic parameters were normal.  2. Right ventricular systolic function is normal. The right ventricular size is normal.  3. The mitral valve is normal in structure. Trivial mitral valve regurgitation. No evidence of mitral stenosis.  4. The aortic valve is tricuspid. There is mild thickening of the aortic valve. Aortic valve regurgitation is mild. No aortic stenosis is present. FINDINGS  Left Ventricle: Left ventricular ejection fraction, by estimation, is 60 to 65%. The left ventricle has normal function. The left ventricle has no regional wall motion abnormalities. The left ventricular internal cavity size was normal in size. There is  mild left ventricular hypertrophy. Left ventricular diastolic parameters were normal. Right Ventricle: The right ventricular size is normal. No  increase in right ventricular wall thickness. Right ventricular systolic function is normal. Left Atrium: Left atrial size was normal in size. Right Atrium: Right atrial size was normal in size. Pericardium: There is no evidence of pericardial effusion. Mitral Valve: The mitral valve is normal in structure. Trivial mitral  valve regurgitation. No evidence of mitral valve stenosis. Tricuspid Valve: The tricuspid valve is normal in structure. Tricuspid valve regurgitation is trivial. No evidence of tricuspid stenosis. Aortic Valve: The aortic valve is tricuspid. There is mild thickening of the aortic valve. Aortic valve regurgitation is mild. Aortic regurgitation PHT measures 393 msec. No aortic stenosis is present. Aortic valve mean gradient measures 5.0 mmHg. Aortic  valve peak gradient measures 12.0 mmHg. Aortic valve area, by VTI measures 2.10 cm. Pulmonic Valve: The pulmonic valve was normal in structure. Pulmonic valve regurgitation is trivial. No evidence of pulmonic stenosis. Aorta: The aortic root is normal in size and structure. IAS/Shunts: The interatrial septum was not well visualized.  LEFT VENTRICLE PLAX 2D LVIDd:         4.60 cm   Diastology LVIDs:         2.70 cm   LV e' medial:    8.24 cm/s LV PW:         1.10 cm   LV E/e' medial:  10.3 LV IVS:        1.00 cm   LV e' lateral:   9.64 cm/s LVOT diam:     1.80 cm   LV E/e' lateral: 8.8 LV SV:         70 LV SV Index:   39 LVOT Area:     2.54 cm                           3D Volume EF:                          3D EF:        54 %                          LV EDV:       114 ml                          LV ESV:       53 ml                          LV SV:        62 ml RIGHT VENTRICLE             IVC RV Basal diam:  3.40 cm     IVC diam: 1.70 cm RV S prime:     13.40 cm/s TAPSE (M-mode): 2.3 cm LEFT ATRIUM             Index        RIGHT ATRIUM           Index LA diam:        4.20 cm 2.33 cm/m   RA Area:     16.20 cm LA Vol (A2C):   66.0 ml 36.62 ml/m  RA  Volume:   39.60 ml  21.97 ml/m LA Vol (A4C):   46.8 ml 25.97 ml/m LA Biplane Vol: 56.3 ml 31.24 ml/m  AORTIC VALVE AV Area (Vmax):    1.82 cm AV Area (Vmean):   2.00 cm AV Area (VTI):     2.10 cm AV Vmax:           173.00 cm/s AV Vmean:          103.000 cm/s AV VTI:  0.334 m AV Peak Grad:      12.0 mmHg AV Mean Grad:      5.0 mmHg LVOT Vmax:         124.00 cm/s LVOT Vmean:        80.800 cm/s LVOT VTI:          0.275 m LVOT/AV VTI ratio: 0.82 AI PHT:            393 msec  AORTA Ao Root diam: 2.90 cm MITRAL VALVE                TRICUSPID VALVE MV Area (PHT): 3.66 cm     TR Peak grad:   20.8 mmHg MV Decel Time: 207 msec     TR Vmax:        228.00 cm/s MV E velocity: 84.70 cm/s MV A velocity: 104.00 cm/s  SHUNTS MV E/A ratio:  0.81         Systemic VTI:  0.28 m                             Systemic Diam: 1.80 cm Weston Brass MD Electronically signed by Weston Brass MD Signature Date/Time: 05/07/2023/1:34:09 PM    Final         Scheduled Meds:  atorvastatin  40 mg Oral Daily   cyanocobalamin  1,000 mcg Intramuscular Q Mon   epinephrine  0-10 mcg/min Intravenous To OR   folic acid  1 mg Oral Daily   insulin   Intravenous To OR   [facility] Blood Cardioplegia vial (lidocaine/magnesium/mannitol 0.26g-4g-6.4g)   Intracoronary To OR   [facility] Blood Cardioplegia vial (lidocaine/magnesium/mannitol 0.26g-4g-6.4g)   Intracoronary To OR   multivitamin with minerals  1 tablet Oral Daily   oseltamivir  30 mg Oral Once   oseltamivir  75 mg Oral BID   phenylephrine  30-200 mcg/min Intravenous To OR   potassium chloride  80 mEq Other To OR   [START ON 05/15/2023] thiamine (VITAMIN B1) injection  100 mg Intravenous Daily   tranexamic acid  15 mg/kg Intravenous To OR   tranexamic acid  2 mg/kg Intracatheter To OR   vancomycin (VANCOCIN) 1,000 mg in sodium chloride 0.9 % 1,000 mL irrigation   Irrigation To OR   Continuous Infusions:   ceFAZolin (ANCEF) IV      ceFAZolin (ANCEF) IV      dexmedetomidine     heparin 30,000 units/NS 1000 mL solution for CELLSAVER     heparin 1,000 Units/hr (05/09/23 0732)   milrinone     nitroGLYCERIN     norepinephrine     thiamine (VITAMIN B1) injection 250 mg (05/09/23 0939)   tranexamic acid (CYKLOKAPRON) 2,500 mg in sodium chloride 0.9 % 250 mL (10 mg/mL) infusion     vancomycin       LOS: 2 days    Time spent: 40 minutes    Dorcas Carrow, MD [facility]

## 2023-05-09 NOTE — Plan of Care (Signed)

## 2023-05-09 NOTE — Progress Notes (Signed)
PHARMACY - ANTICOAGULATION CONSULT NOTE  Pharmacy Consult for heparin Indication:  aortic thrombus  Allergies  Allergen Reactions   Motrin [Ibuprofen] Anaphylaxis   Flexeril [Cyclobenzaprine] Hives    Patient Measurements: Height: 5\' 2"  (157.5 cm) Weight: 79 kg (174 lb 1.6 oz) IBW/kg (Calculated) : 50.1 Heparin Dosing Weight: 67.5 kg  Vital Signs: Temp: 98.5 F (36.9 C) (01/22 1500) Temp Source: Oral (01/22 1500) BP: 123/83 (01/22 1500) Pulse Rate: 80 (01/22 1500)  Labs: Recent Labs    05/06/23 2213 05/06/23 2347 05/07/23 0343 05/07/23 1024 05/07/23 1320 05/08/23 0833 05/08/23 1841 05/09/23 0543 05/09/23 1708  HGB  --   --    < > 14.3  --  12.1  --  11.7*  --   HCT  --   --    < > 42.9  --  36.9  --  35.1*  --   PLT  --   --    < > 168  --  462*  --  417*  --   APTT 31  --   --   --   --   --   --   --   --   LABPROT  --  15.6*  --   --   --   --   --   --   --   INR  --  1.2  --   --   --   --   --   --   --   HEPARINUNFRC  --  0.14*  --   --    < > 0.53 0.33 0.19* 0.27*  CREATININE  --   --   --   --   --   --   --  0.82  --    < > = values in this interval not displayed.    Estimated Creatinine Clearance: 76.4 mL/min (by C-G formula based on SCr of 0.82 mg/dL).  Medical History: No past medical history on file.  Assessment: 53 y/o female presenting with altered mental status. PMH unknown. CT imaging shows Large intraluminal filling defect within the aortic arch, consistent with thromboembolism. Pharmacy has been consulted to initiate heparin infusion. Per chart review, patient is not on anticoagulation prior to admission.  Per neurology, will initiate infusion without bolus and target lower heparin level goal (0.3-0.5) due to multiple embolic CVA.  Heparin level subtherapeutic at 0.27 on 1000 units/hr. No bleeding or infusion issues per RN. Plan for possible thrombectomy next week.  Goal of Therapy:  Heparin level 0.3-0.5 units/ml Monitor platelets by  anticoagulation protocol: Yes   Plan:  Increase heparin infusion to 1100 units/hr Check anti-Xa level in 8 hours and daily while on heparin Continue to monitor H&H and platelets F/u oral AC  Thank you for involving pharmacy in this patient's care.  Loura Back, PharmD, BCPS Clinical Pharmacist Clinical phone for 05/09/2023 is [phone] 05/09/2023 6:33 PM

## 2023-05-09 NOTE — Progress Notes (Signed)
PHARMACY - ANTICOAGULATION CONSULT NOTE  Pharmacy Consult for heparin Indication:  aortic thrombus  Allergies  Allergen Reactions   Motrin [Ibuprofen] Anaphylaxis   Flexeril [Cyclobenzaprine] Hives    Patient Measurements: Height: 5\' 2"  (157.5 cm) Weight: 79 kg (174 lb 1.6 oz) IBW/kg (Calculated) : 50.1 Heparin Dosing Weight: 67.5 kg  Vital Signs: Temp: 98.5 F (36.9 C) (01/22 0818) Temp Source: Oral (01/22 0818) BP: 132/79 (01/22 0916) Pulse Rate: 72 (01/22 0818)  Labs: Recent Labs    05/06/23 1602 05/06/23 1605 05/06/23 2213 05/06/23 2347 05/07/23 0343 05/07/23 1024 05/07/23 1320 05/08/23 0833 05/08/23 1841 05/09/23 0543  HGB 14.6  --   --   --    < > 14.3  --  12.1  --  11.7*  HCT 43.0  --   --   --    < > 42.9  --  36.9  --  35.1*  PLT  --   --   --   --    < > 168  --  462*  --  417*  APTT  --   --  31  --   --   --   --   --   --   --   LABPROT  --   --   --  15.6*  --   --   --   --   --   --   INR  --   --   --  1.2  --   --   --   --   --   --   HEPARINUNFRC  --   --   --  0.14*  --   --    < > 0.53 0.33 0.19*  CREATININE 1.30* 1.33*  --   --   --   --   --   --   --  0.82   < > = values in this interval not displayed.    Estimated Creatinine Clearance: 76.4 mL/min (by C-G formula based on SCr of 0.82 mg/dL).  Medical History: No past medical history on file.  Assessment: 53 y/o female presenting with altered mental status. PMH unknown. CT imaging shows Large intraluminal filling defect within the aortic arch, consistent with thromboembolism. Pharmacy has been consulted to initiate heparin infusion. Per chart review, patient is not on anticoagulation prior to admission.  Per neurology, will initiate infusion without bolus and target lower heparin level goal (0.3-0.5) due to multiple embolic CVA.  HL subtherapeutic this AM. Plan for possible thrombectomy next week. CBC stable.   Goal of Therapy:  Heparin level 0.3-0.5 units/ml Monitor platelets by  anticoagulation protocol: Yes   Plan:  Increase heparin infusion 1000 units/hr Check anti-Xa level in 8 hours and daily while on heparin Continue to monitor H&H and platelets F/u oral AC  Ulyses Southward, PharmD, BCIDP, AAHIVP, CPP Infectious Disease Pharmacist 05/09/2023 10:50 AM

## 2023-05-09 NOTE — Progress Notes (Addendum)
STROKE TEAM PROGRESS NOTE   INTERIM HISTORY/SUBJECTIVE No family at bedside.  Patient lying bed, AOx3, asking about his medical treatment plan.  Told her that she is on heparin IV and pending progress to decide whether thrombectomy needed.  She expressed understanding.  Cessation of cocaine, smoking and alcohol again educated.  OBJECTIVE  CBC    Component Value Date/Time   WBC 6.2 05/09/2023 0543   RBC 4.00 05/09/2023 0543   HGB 11.7 (L) 05/09/2023 0543   HCT 35.1 (L) 05/09/2023 0543   PLT 417 (H) 05/09/2023 0543   MCV 87.8 05/09/2023 0543   MCH 29.3 05/09/2023 0543   MCHC 33.3 05/09/2023 0543   RDW 15.4 05/09/2023 0543    BMET    Component Value Date/Time   NA 136 05/09/2023 0543   K 3.7 05/09/2023 0543   CL 105 05/09/2023 0543   CO2 23 05/09/2023 0543   GLUCOSE 92 05/09/2023 0543   BUN 12 05/09/2023 0543   CREATININE 0.82 05/09/2023 0543   CALCIUM 8.7 (L) 05/09/2023 0543   GFRNONAA >60 05/09/2023 0543    IMAGING past 24 hours CT CORONARY MORPH W/CTA COR W/SCORE W/CA W/CM &/OR WO/CM Result Date: 05/09/2023 CLINICAL DATA:  This is a 53 year old female with anginal symptoms EXAM: Cardiac/Coronary  CTA TECHNIQUE: The patient was scanned on a [employer]. FINDINGS: A 100 kV prospective scan was triggered in the descending thoracic aorta at 111 HU's. Axial non-contrast 3 mm slices were carried out through the heart. The data set was analyzed on a dedicated work station and scored using the Agatson method. Gantry rotation speed was 250 msecs and collimation was .6 mm. No beta blockade and 0.8 mg of sl NTG was given. The 3D data set was reconstructed in 5% intervals of the 67-82 % of the R-R cycle. Diastolic phases were analyzed on a dedicated work station using MPR, MIP and VRT modes. The patient received 80 cc of contrast. Image Quality: Fair with misregistration artifact. Aorta: Normal size.  No calcifications.  No dissection. Aortic Valve:  Trileaflet.  No  calcifications. Coronary Arteries:  Normal coronary origin.  Right dominance. RCA is a large dominant artery that gives rise to PDA and PLA. There is a mild (25-49%) focal calcification in the mid LAD. The proximal and distal LAD with no plaques. Left main is a large artery that gives rise to LAD and LCX arteries. LAD is a large vessel. There is a mixed focal mild plaque in the mid LAD. The proximal and distal LAD with no plaques. LCX is a non-dominant artery that gives rise to one large OM1 branch. There is a mix mild plaque in the proximal to mid LCX. The mid and distal LCX with no plaques. Coronary Calcium Score: Left main: 0 Left anterior descending artery: 35.3 Left circumflex artery: 34.7 Right coronary artery: 11.1 Total: 81.1 Percentile: 95 Other findings: Normal pulmonary vein drainage into the left atrium. Normal left atrial appendage without a thrombus. Normal size of the pulmonary artery. IMPRESSION: 1. Coronary calcium score of 81.1. This was 95 percentile for age and sex matched control. 2. Normal coronary origin with right dominance. 3. CAD-RADS 2. Mild non-obstructive CAD (25-49%). Consider non-atherosclerotic causes of chest pain. Consider preventive therapy and risk factor modification. 4. Total plaque volume is 97 mm3 which is 72nd percentile for age- and sex-matched controls (calcified plaque 11 mm3; non-calcified plaque 86 mm3, Low attenuation 1 mm3). TPV is mild. The noncardiac portion of this study will be  interpreted in separate report by the radiologist. Electronically Signed   By: Thomasene Ripple D.O.   On: 05/09/2023 15:06    Vitals:   05/09/23 0818 05/09/23 0916 05/09/23 1100 05/09/23 1500  BP: 109/68 132/79 128/75 123/83  Pulse: 72  75 80  Resp: 18  18 18   Temp: 98.5 F (36.9 C)  98.3 F (36.8 C) 98.5 F (36.9 C)  TempSrc: Oral  Oral Oral  SpO2: 100%   100%  Weight:      Height:         PHYSICAL EXAM General:  Alert, well-nourished, well-developed patient in no acute  distress Psych:  Mood and affect appropriate for situation CV: Regular rate and rhythm on monitor Respiratory:  Regular, unlabored respirations on room air GI: Abdomen soft and nontender  NEURO:  Pt eyes open, awake alert, educated again on tobacco, heavy alcohol and cocaine cessation. Orientated to age, place, time. No aphasia, paucity of speech, following all simple commands. Able to name and repeat. No gaze palsy, tracking bilaterally, visual field full. No facial droop. Tongue midline. right arm with drift weak right grip compared to left grip and right leg with drift-can slightly wiggle right toes. Sensation decreased on the right with right neglect, b/l left intact, gait not tested.    ASSESSMENT/PLAN [name] is a 53 y.o. female presented to the hospital by family because she had been asleep all day. MRI Brain shows embolic appearing infarcts, largest in left parietal lobe.   Stroke:  bilateral L>R left parietal and occipital lobe, embolic pattern, etiology: Large aortic arch thrombus due to uncontrolled risk factors including smoking, heavy alcohol and cocaine use CT head No acute abnormality.  CTA head & neck no LVO. Large intraluminal filling defect within the aortic arch  MRI  Multifocal bilateral acute ischemia, greatest in the left parietal and occipital lobe, but also present in the posterior right hemisphere. No hemorrhage or mass effect. 2D Echo EF 60 to 65%.  LV with mild hypertrophy LTM EEG showed no seizure -> discontinue LDL 91 HgbA1c 5.7 UDS positive for cocaine VTE prophylaxis -heparin IV No antithrombotic prior to admission, now on heparin IV  Therapy recommendations:  CIR Disposition: Pending  Hypertension Home meds: Amlodipine 5 mg Stable Long-term BP goal normotensive  Hyperlipidemia Home meds: None LDL 91, goal < 70 Add lovastatin 40 mg Continue statin at discharge  Aortic thrombus CTA chest/ abdomen and pelvis-  Intraluminal thrombus in the  aortic arch as seen on CTA neck earlier today. Additional small filling defects in the descending aorta measuring 4 mm. No definite dissection flap. No aneurysm. Wedge-shaped filling defect in the inferior pole of the right kidney is favored to represent a chronic scar though infarct could appear similarly. Likely due to uncontrolled risk factors including smoking, alcohol and cocaine use Cardiothoracic surgery consulted -> high risk for thrombectomy Continue IV heparin low-dose no bolus  Plan to repeat CTA next week and if no improvement, CT surgery may consider thrombectomy at that time.   Tobacco Abuse Patient smokes 5 cigarettes/day       Ready to quit? Yes Nicotine replacement therapy provided  Substance Abuse EtOH abuse Patient uses cocaine UDS positive for Cocaine       Ready to quit? Yes Drinks 1/5 of tequila daily, along with 4 -40 ounce bottles of beer advised to drink no more than 1 drink(s) a day CIWA protocol Thiamine, folate and multivitamin Thiamine pending Vitamin B12-260, now supplementing TOC consult for  cessation placed  Influenza A  Flu A PCR + Intermittent coughing Droplet protection Blood culture NGTD  Other Stroke Risk Factors Obesity, Body mass index is 31.84 kg/m., BMI >/= 30 associated with increased stroke risk, recommend weight loss, diet and exercise as appropriate  Migraines History of PE in 2023  Other Active Problems GERD COPD/asthma Bipolar disorder Schizoaffective disorder History of neuropathy Ammonia level 45--22 Spiking fever Tmax 102.5 on admission --afebrile, no leukocytosis AKI, creatinine 1.33--0.82  Hospital day # 2  Neurology will sign off. Please call with questions. Pt will follow up with stroke clinic NP at [facility] in about 4 weeks. Thanks for the consult.   Marvel Plan, MD PhD Stroke Neurology 05/09/2023 6:45 PM    To contact Stroke Continuity provider, please refer to [email or web address]. After hours, contact General  Neurology

## 2023-05-09 NOTE — Plan of Care (Signed)
Problem: Education: Goal: Knowledge of General Education information will improve Description: Including pain rating scale, medication(s)/side effects and non-pharmacologic comfort measures 05/09/2023 1639 by Luvenia Starch, RN Outcome: Progressing 05/09/2023 1638 by Luvenia Starch, RN Outcome: Progressing   Problem: Health Behavior/Discharge Planning: Goal: Ability to manage health-related needs will improve 05/09/2023 1639 by Luvenia Starch, RN Outcome: Progressing 05/09/2023 1638 by Luvenia Starch, RN Outcome: Progressing   Problem: Clinical Measurements: Goal: Ability to maintain clinical measurements within normal limits will improve 05/09/2023 1639 by Luvenia Starch, RN Outcome: Progressing 05/09/2023 1638 by Luvenia Starch, RN Outcome: Progressing Goal: Will remain free from infection 05/09/2023 1639 by Luvenia Starch, RN Outcome: Progressing 05/09/2023 1638 by Luvenia Starch, RN Outcome: Progressing Goal: Diagnostic test results will improve 05/09/2023 1639 by Luvenia Starch, RN Outcome: Progressing 05/09/2023 1638 by Luvenia Starch, RN Outcome: Progressing Goal: Respiratory complications will improve 05/09/2023 1639 by Luvenia Starch, RN Outcome: Progressing 05/09/2023 1638 by Luvenia Starch, RN Outcome: Progressing Goal: Cardiovascular complication will be avoided 05/09/2023 1639 by Luvenia Starch, RN Outcome: Progressing 05/09/2023 1638 by Luvenia Starch, RN Outcome: Progressing   Problem: Activity: Goal: Risk for activity intolerance will decrease 05/09/2023 1639 by Luvenia Starch, RN Outcome: Progressing 05/09/2023 1638 by Luvenia Starch, RN Outcome: Progressing   Problem: Nutrition: Goal: Adequate nutrition will be maintained 05/09/2023 1639 by Luvenia Starch, RN Outcome: Progressing 05/09/2023 1638 by Luvenia Starch, RN Outcome: Progressing   Problem: Coping: Goal: Level of anxiety will  decrease 05/09/2023 1639 by Luvenia Starch, RN Outcome: Progressing 05/09/2023 1638 by Luvenia Starch, RN Outcome: Progressing   Problem: Elimination: Goal: Will not experience complications related to bowel motility 05/09/2023 1639 by Luvenia Starch, RN Outcome: Progressing 05/09/2023 1638 by Luvenia Starch, RN Outcome: Progressing Goal: Will not experience complications related to urinary retention 05/09/2023 1639 by Luvenia Starch, RN Outcome: Progressing 05/09/2023 1638 by Luvenia Starch, RN Outcome: Progressing   Problem: Pain Managment: Goal: General experience of comfort will improve and/or be controlled 05/09/2023 1639 by Luvenia Starch, RN Outcome: Progressing 05/09/2023 1638 by Luvenia Starch, RN Outcome: Progressing   Problem: Safety: Goal: Ability to remain free from injury will improve 05/09/2023 1639 by Luvenia Starch, RN Outcome: Progressing 05/09/2023 1638 by Luvenia Starch, RN Outcome: Progressing   Problem: Skin Integrity: Goal: Risk for impaired skin integrity will decrease 05/09/2023 1639 by Luvenia Starch, RN Outcome: Progressing 05/09/2023 1638 by Luvenia Starch, RN Outcome: Progressing   Problem: Education: Goal: Knowledge of disease or condition will improve 05/09/2023 1639 by Luvenia Starch, RN Outcome: Progressing 05/09/2023 1638 by Luvenia Starch, RN Outcome: Progressing Goal: Knowledge of secondary prevention will improve (MUST DOCUMENT ALL) 05/09/2023 1639 by Luvenia Starch, RN Outcome: Progressing 05/09/2023 1638 by Luvenia Starch, RN Outcome: Progressing Goal: Knowledge of patient specific risk factors will improve (DELETE if not current risk factor) 05/09/2023 1639 by Luvenia Starch, RN Outcome: Progressing 05/09/2023 1638 by Luvenia Starch, RN Outcome: Progressing   Problem: Ischemic Stroke/TIA Tissue Perfusion: Goal: Complications of ischemic stroke/TIA will be  minimized 05/09/2023 1639 by Luvenia Starch, RN Outcome: Progressing 05/09/2023 1638 by Luvenia Starch, RN Outcome: Progressing   Problem: Coping: Goal: Will verbalize positive feelings about self 05/09/2023 1639 by Luvenia Starch, RN Outcome: Progressing 05/09/2023 1638 by Luvenia Starch, RN Outcome: Progressing Goal: Will identify appropriate support needs 05/09/2023  1639 by Luvenia Starch, RN Outcome: Progressing 05/09/2023 1638 by Luvenia Starch, RN Outcome: Progressing   Problem: Health Behavior/Discharge Planning: Goal: Ability to manage health-related needs will improve 05/09/2023 1639 by Luvenia Starch, RN Outcome: Progressing 05/09/2023 1638 by Luvenia Starch, RN Outcome: Progressing Goal: Goals will be collaboratively established with patient/family 05/09/2023 1639 by Luvenia Starch, RN Outcome: Progressing 05/09/2023 1638 by Luvenia Starch, RN Outcome: Progressing   Problem: Self-Care: Goal: Ability to participate in self-care as condition permits will improve 05/09/2023 1639 by Luvenia Starch, RN Outcome: Progressing 05/09/2023 1638 by Luvenia Starch, RN Outcome: Progressing Goal: Verbalization of feelings and concerns over difficulty with self-care will improve 05/09/2023 1639 by Luvenia Starch, RN Outcome: Progressing 05/09/2023 1638 by Luvenia Starch, RN Outcome: Progressing Goal: Ability to communicate needs accurately will improve 05/09/2023 1639 by Luvenia Starch, RN Outcome: Progressing 05/09/2023 1638 by Luvenia Starch, RN Outcome: Progressing   Problem: Nutrition: Goal: Risk of aspiration will decrease 05/09/2023 1639 by Luvenia Starch, RN Outcome: Progressing 05/09/2023 1638 by Luvenia Starch, RN Outcome: Progressing Goal: Dietary intake will improve 05/09/2023 1639 by Luvenia Starch, RN Outcome: Progressing 05/09/2023 1638 by Luvenia Starch, RN Outcome: Progressing

## 2023-05-09 NOTE — Progress Notes (Signed)
PHARMACY NOTE:  ANTIMICROBIAL RENAL DOSAGE ADJUSTMENT  Current antimicrobial regimen includes a mismatch between antimicrobial dosage and estimated renal function.  As per policy approved by the Pharmacy & Therapeutics and Medical Executive Committees, the antimicrobial dosage will be adjusted accordingly.  Current antimicrobial dosage:  Tamiflu 30mg  BID  Indication: Flu  Renal Function:  Estimated Creatinine Clearance: 76.4 mL/min (by C-G formula based on SCr of 0.82 mg/dL). []      On intermittent HD, scheduled: []      On CRRT    Antimicrobial dosage has been changed to:  Tamiflu 75mg  PO BID  Additional comments:  Ulyses Southward, PharmD, BCIDP, AAHIVP, CPP Infectious Disease Pharmacist 05/09/2023 2:32 PM

## 2023-05-10 DIAGNOSIS — I2510 Atherosclerotic heart disease of native coronary artery without angina pectoris: Secondary | ICD-10-CM | POA: Diagnosis not present

## 2023-05-10 DIAGNOSIS — I639 Cerebral infarction, unspecified: Secondary | ICD-10-CM | POA: Diagnosis not present

## 2023-05-10 DIAGNOSIS — F1410 Cocaine abuse, uncomplicated: Secondary | ICD-10-CM | POA: Diagnosis not present

## 2023-05-10 DIAGNOSIS — I7410 Embolism and thrombosis of unspecified parts of aorta: Secondary | ICD-10-CM | POA: Diagnosis not present

## 2023-05-10 LAB — CBC
HCT: 33 % — ABNORMAL LOW (ref 36.0–46.0)
Hemoglobin: 11.1 g/dL — ABNORMAL LOW (ref 12.0–15.0)
MCH: 29.7 pg (ref 26.0–34.0)
MCHC: 33.6 g/dL (ref 30.0–36.0)
MCV: 88.2 fL (ref 80.0–100.0)
Platelets: 400 10*3/uL (ref 150–400)
RBC: 3.74 MIL/uL — ABNORMAL LOW (ref 3.87–5.11)
RDW: 15.5 % (ref 11.5–15.5)
WBC: 9.1 10*3/uL (ref 4.0–10.5)
nRBC: 0 % (ref 0.0–0.2)

## 2023-05-10 LAB — HEPARIN LEVEL (UNFRACTIONATED): Heparin Unfractionated: 0.31 [IU]/mL (ref 0.30–0.70)

## 2023-05-10 MED ORDER — DOCUSATE SODIUM 100 MG PO CAPS
100.0000 mg | ORAL_CAPSULE | Freq: Two times a day (BID) | ORAL | Status: DC
  Administered 2023-05-10 – 2023-05-18 (×14): 100 mg via ORAL
  Filled 2023-05-10 (×16): qty 1

## 2023-05-10 NOTE — Evaluation (Signed)
Physical Therapy Evaluation Patient Details Name: Kathryn [name] MRN: [id number] DOB: 1971/03/08 Today's Date: 05/10/2023  History of Present Illness  53 y.o. female adm 1/19 with medical history significant of COPD, HTN, substance abuse.     Pt LKW 2300 on 1/18.     Pt sleeping all day today so family brought pt to ED.     MRI bain demonstrates embolic infarcts.     CTA = no LVO     Work up ultimately demonstrated the very likely source to be a aortic arch thrombus. Likely to have thrombectomy, however date not yet finalized.  Clinical Impression   Pt admitted secondary to problem above with deficits below. PTA patient was independent with all mobility. She was living with friends, however the lease runs out 2/1 and she is unsure where she will go to live. Pt currently requires CGA for ambulation with IV pole with reports of spinning dizziness the entire time she was up and moving and once settle in the chair.  Anticipate patient will benefit from PT to address problems listed below.Will continue to follow acutely to maximize functional mobility independence and safety. If possible, could benefit from HHPT.          If plan is discharge home, recommend the following: A little help with walking and/or transfers;Assistance with cooking/housework;Direct supervision/assist for medications management;Direct supervision/assist for financial management;Assist for transportation;Supervision due to cognitive status;Help with stairs or ramp for entrance   Can travel by private vehicle        Equipment Recommendations Rolling walker (2 wheels)  Recommendations for Other Services       Functional Status Assessment Patient has had a recent decline in their functional status and demonstrates the ability to make significant improvements in function in a reasonable and predictable amount of time.     Precautions / Restrictions Precautions Precautions: Fall Restrictions Weight Bearing Restrictions Per  Provider Order: No      Mobility  Bed Mobility Overal bed mobility: Needs Assistance Bed Mobility: Supine to Sit     Supine to sit: Supervision     General bed mobility comments: for safety and lines    Transfers Overall transfer level: Needs assistance Equipment used: None Transfers: Sit to/from Stand, Bed to chair/wheelchair/BSC Sit to Stand: Contact guard assist           General transfer comment: from EOB and toilet; guarding due to reports of dizziness/spinning    Ambulation/Gait Ambulation/Gait assistance: Min assist Gait Distance (Feet): 12 Feet (toileted; 25) Assistive device: IV Pole Gait Pattern/deviations: Step-through pattern, Decreased stride length, Shuffle, Narrow base of support   Gait velocity interpretation: <1.8 ft/sec, indicate of risk for recurrent falls   General Gait Details: reports feeling she's spinning; no nystagmus noted; distance limited due to aortic thrombus and awaiting thrombectomy  Stairs            Wheelchair Mobility     Tilt Bed    Modified Rankin (Stroke Patients Only) Modified Rankin (Stroke Patients Only) Pre-Morbid Rankin Score: No symptoms Modified Rankin: Moderately severe disability     Balance Overall balance assessment: Needs assistance Sitting-balance support: Feet supported Sitting balance-Leahy Scale: Good     Standing balance support: No upper extremity supported Standing balance-Leahy Scale: Fair                               Pertinent Vitals/Pain Pain Assessment Pain Assessment: Faces Faces Pain Scale: Hurts little  more Pain Location: Head Pain Descriptors / Indicators: Headache Pain Intervention(s): Limited activity within patient's tolerance, Monitored during session    Home Living Family/patient expects to be discharged to:: Private residence Living Arrangements: Non-relatives/Friends Available Help at Discharge: Family;Available PRN/intermittently Type of Home: House  (Rental house, rented by others, she was staying there.  Rental ends 05/19/2023, unsure where she will be living.) Home Access: Level entry       Home Layout: One level Home Equipment: None Additional Comments: staying with friends whose lease ends 05/19/23 and unsure where she will go    Prior Function Prior Level of Function : Independent/Modified Independent                     Extremity/Trunk Assessment   Upper Extremity Assessment Upper Extremity Assessment: Defer to OT evaluation    Lower Extremity Assessment Lower Extremity Assessment: Overall WFL for tasks assessed    Cervical / Trunk Assessment Cervical / Trunk Assessment: Normal  Communication   Communication Communication: No apparent difficulties  Cognition Arousal: Alert Behavior During Therapy: WFL for tasks assessed/performed Overall Cognitive Status: No family/caregiver present to determine baseline cognitive functioning Area of Impairment: Orientation, Following commands, Safety/judgement, Awareness, Problem solving, Memory                 Orientation Level: Disoriented to, Situation   Memory: Decreased short-term memory Following Commands: Follows one step commands with increased time Safety/Judgement: Decreased awareness of deficits Awareness: Intellectual Problem Solving: Slow processing, Requires verbal cues          General Comments      Exercises     Assessment/Plan    PT Assessment Patient needs continued PT services  PT Problem List Decreased balance;Decreased mobility;Decreased cognition;Decreased knowledge of use of DME;Cardiopulmonary status limiting activity       PT Treatment Interventions DME instruction;Gait training;Functional mobility training;Therapeutic activities;Therapeutic exercise;Balance training;Neuromuscular re-education;Cognitive remediation;Patient/family education    PT Goals (Current goals can be found in the Care Plan section)  Acute Rehab PT  Goals Patient Stated Goal: be able to hav thrombectomy PT Goal Formulation: With patient Time For Goal Achievement: 05/24/23 Potential to Achieve Goals: Good    Frequency Min 3X/week     Co-evaluation               AM-PAC PT "6 Clicks" Mobility  Outcome Measure Help needed turning from your back to your side while in a flat bed without using bedrails?: None Help needed moving from lying on your back to sitting on the side of a flat bed without using bedrails?: A Little Help needed moving to and from a bed to a chair (including a wheelchair)?: A Little Help needed standing up from a chair using your arms (e.g., wheelchair or bedside chair)?: A Little Help needed to walk in hospital room?: A Little Help needed climbing 3-5 steps with a railing? : Total 6 Click Score: 17    End of Session Equipment Utilized During Treatment: Gait belt Activity Tolerance: Patient tolerated treatment well Patient left: in chair;with call bell/phone within reach;with chair alarm set Nurse Communication: Mobility status PT Visit Diagnosis: Unsteadiness on feet (R26.81);Other abnormalities of gait and mobility (R26.89);Dizziness and giddiness (R42)    Time: 1610-9604 PT Time Calculation (min) (ACUTE ONLY): 18 min   Charges:   PT Evaluation $PT Eval Low Complexity: 1 Low   PT General Charges $$ ACUTE PT VISIT: 1 Visit          Jerolyn Center, PT  Acute Rehabilitation Services  Office [phone]   Zena Amos 05/10/2023, 2:36 PM

## 2023-05-10 NOTE — Progress Notes (Signed)
TRH night cross cover note:   I was notified by RN of the patient's request for stool softener.  I subsequently added scheduled Colace on twice daily basis.    Newton Pigg, DO Hospitalist

## 2023-05-10 NOTE — Progress Notes (Signed)
PROGRESS NOTE    Leyre Schlanger  FIE:[id number] DOB: [birth year] DOA: 05/06/2023 PCP: Patient, No Pcp Per    Brief Narrative:  53 year old with history of COPD, hypertension, substance abuse, psychiatric disorder presented with being excessively lethargic at home.  Patient was sleeping at home all day so her daughter called EMS.  In the emergency room initially agitated then sleepy after Ativan.  MRI brain consistent with multiple embolic infarcts. CTA without large vessel occlusion however it showed large thrombus at the aortic arch. CT angiogram of the aorta with above, no dissection.  Admitted with neurology and CT surgery consultation.  Subjective:  Patient seen and examined.  Still has some dry cough.  Overall she feels like she has some improvement of weakness.  She is aware about staying in the hospital on her drip and possible surgery next week.   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 Clinical findings, lethargic in the context of stroke, influenza A. CT head findings, no acute findings. MRI of the brain, multifocal bilateral ischemia.  No pressure effect. CTA of the head and neck with no large vessel occlusion.  Large intraluminal filling defect within the aortic arch. CT angiogram aorta, chest abdomen pelvis with no aortic dissection but large thrombus load on ascending aorta. 2D echocardiogram, normal EF.  No evidence of intracardiac thrombus. Antiplatelet therapy, none at home.  Currently tolerating heparin. LDL 91. Hemoglobin A1c, 5-7. Recommended lifelong anticoagulation.  Acute metabolic encephalopathy: Likely multifactorial.  Stroke, influenza, drugs.  Mental status improving.  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  Primary cause unknown.  Active cocaine use.  On IV heparin. CT surgery following, Continue heparin. She will need lifelong anticoagulation  after the procedure. Thrombectomy, timing pending.  CT surgery asked for cardiac clearance.  Seen by cardiology.  Echocardiogram and CT coronaries essentially normal.  Cleared for surgery.  Smoker and cocaine use: Nicotine provided.  Counseled.  Alcoholism: Drinks alcohol 1/5 of tequila daily along with a beer.  CIWA protocol.  Multivitamins.  Stable so far.  No evidence of alcohol withdrawal.  Influenza A: Positive for flu a PCR.  Tamiflu for 5 days.  AKI: Treated with IV fluid and improved.  Bipolar disorder: Patient on Celexa, Lyrica, Seroquel at home.  Started.  Essential hypertension: Blood pressure stable.  Currently not on any blood pressure medications.  DVT prophylaxis: Heparin infusion   Code Status: Full code Family Communication: None at the bedside Disposition Plan: Status is: Inpatient Remains inpatient appropriate because: Stroke workup, inpatient procedures planned     Consultants:  CT surgery Neurology  Procedures:  EEG  Antimicrobials:  Tamiflu 1/20---     Objective: Vitals:   05/09/23 2358 05/10/23 0416 05/10/23 0755 05/10/23 1051  BP: 119/70 (!) 90/56 93/72 (!) 95/55  Pulse: 77 71 81 80  Resp: 17 18 17 17   Temp: 98.6 F (37 C) 98.8 F (37.1 C) 98.8 F (37.1 C) 98.9 F (37.2 C)  TempSrc: Oral Oral Oral Oral  SpO2: 100% 98% 100% 98%  Weight:      Height:        Intake/Output Summary (Last 24 hours) at 05/10/2023 1115 Last data filed at 05/10/2023 0301 Gross per 24 hour  Intake 775.16 ml  Output 400 ml  Net 375.16 ml   Filed Weights   05/06/23 2031  Weight: 79 kg    Examination:  General exam: Calm and comfortable.  Looks comfortable.  Pleasant and interactive. Respiratory system: Clear to auscultation. Respiratory effort normal.  Some conducted upper airway sounds. Cardiovascular system: S1 & S2 heard, RRR. No JVD, murmurs, rubs, gallops or clicks. No pedal edema. Gastrointestinal system: Abdomen is nondistended, soft and  nontender. No organomegaly or masses felt. Normal bowel sounds heard. Central nervous system: Alert awake and mostly oriented.  No focal neurodeficits.     Data Reviewed: I have personally reviewed following labs and imaging studies  CBC: Recent Labs  Lab 05/07/23 0343 05/07/23 1024 05/08/23 0833 05/09/23 0543 05/10/23 0620  WBC 6.7 6.2 8.7 6.2 9.1  HGB 12.4 14.3 12.1 11.7* 11.1*  HCT 37.3 42.9 36.9 35.1* 33.0*  MCV 88.6 87.6 88.7 87.8 88.2  PLT 381 168 462* 417* 400   Basic Metabolic Panel: Recent Labs  Lab 05/06/23 1602 05/06/23 1605 05/09/23 0543  NA 140 137 136  K 4.4 3.9 3.7  CL 106 105 105  CO2  --  23 23  GLUCOSE 84 80 92  BUN 19 16 12   CREATININE 1.30* 1.33* 0.82  CALCIUM  --  9.1 8.7*  MG  --  2.3  --    GFR: Estimated Creatinine Clearance: 76.4 mL/min (by C-G formula based on SCr of 0.82 mg/dL). Liver Function Tests: Recent Labs  Lab 05/06/23 1605  AST 35  ALT 20  ALKPHOS 88  BILITOT 0.5  PROT 8.4*  ALBUMIN 3.9   No results for input(s): "LIPASE", "AMYLASE" in the last 168 hours. Recent Labs  Lab 05/06/23 1605 05/07/23 1024  AMMONIA 45* 22   Coagulation Profile: Recent Labs  Lab 05/06/23 2347  INR 1.2   Cardiac Enzymes: No results for input(s): "CKTOTAL", "CKMB", "CKMBINDEX", "TROPONINI" in the last 168 hours. BNP (last 3 results) No results for input(s): "PROBNP" in the last 8760 hours. HbA1C: No results for input(s): "HGBA1C" in the last 72 hours.  CBG: Recent Labs  Lab 05/06/23 1540 05/07/23 1637  GLUCAP 71 84   Lipid Profile: Recent Labs    05/07/23 1233  CHOL 157  HDL 45  LDLCALC 91  TRIG 104  CHOLHDL 3.5   Thyroid Function Tests: No results for input(s): "TSH", "T4TOTAL", "FREET4", "T3FREE", "THYROIDAB" in the last 72 hours.  Anemia Panel: No results for input(s): "VITAMINB12", "FOLATE", "FERRITIN", "TIBC", "IRON", "RETICCTPCT" in the last 72 hours.  Sepsis Labs: Recent Labs  Lab 05/06/23 1728  05/06/23 1930  LATICACIDVEN 1.5 1.1    Recent Results (from the past 240 hours)  Resp panel by RT-PCR (RSV, Flu A&B, Covid) Anterior Nasal Swab     Status: Abnormal   Collection Time: 05/06/23  4:01 PM   Specimen: Anterior Nasal Swab  Result Value Ref Range Status   SARS Coronavirus 2 by RT PCR NEGATIVE NEGATIVE Final    Comment: (NOTE) SARS-CoV-2 target nucleic acids are NOT DETECTED.  The SARS-CoV-2 RNA is generally detectable in upper respiratory specimens during the acute phase of infection. The lowest concentration of SARS-CoV-2 viral copies this assay can detect is 138 copies/mL. A negative result does not preclude SARS-Cov-2 infection and should not be used as the sole basis for treatment or other patient management decisions. A negative result may occur with  improper specimen collection/handling, submission of specimen other than nasopharyngeal swab, presence of viral mutation(s) within the areas targeted by this assay, and inadequate number of viral copies(<138 copies/mL). A negative result must be combined with clinical observations, patient history, and epidemiological information. The expected result is Negative.  Fact  Sheet for Patients:  BloggerCourse.com  Fact Sheet for Healthcare Providers:  SeriousBroker.it  This test is no t yet approved or cleared by the [place] FDA and  has been authorized for detection and/or diagnosis of SARS-CoV-2 by FDA under an Emergency Use Authorization (EUA). This EUA will remain  in effect (meaning this test can be used) for the duration of the COVID-19 declaration under Section 564(b)(1) of the Act, 21 U.S.C.section 360bbb-3(b)(1), unless the authorization is terminated  or revoked sooner.       Influenza A by PCR POSITIVE (A) NEGATIVE Final   Influenza B by PCR NEGATIVE NEGATIVE Final    Comment: (NOTE) The Xpert Xpress SARS-CoV-2/FLU/RSV plus assay is intended as an  aid in the diagnosis of influenza from Nasopharyngeal swab specimens and should not be used as a sole basis for treatment. Nasal washings and aspirates are unacceptable for Xpert Xpress SARS-CoV-2/FLU/RSV testing.  Fact Sheet for Patients: BloggerCourse.com  Fact Sheet for Healthcare Providers: SeriousBroker.it  This test is not yet approved or cleared by the [place] FDA and has been authorized for detection and/or diagnosis of SARS-CoV-2 by FDA under an Emergency Use Authorization (EUA). This EUA will remain in effect (meaning this test can be used) for the duration of the COVID-19 declaration under Section 564(b)(1) of the Act, 21 U.S.C. section 360bbb-3(b)(1), unless the authorization is terminated or revoked.     Resp Syncytial Virus by PCR NEGATIVE NEGATIVE Final    Comment: (NOTE) Fact Sheet for Patients: BloggerCourse.com  Fact Sheet for Healthcare Providers: SeriousBroker.it  This test is not yet approved or cleared by the [place] FDA and has been authorized for detection and/or diagnosis of SARS-CoV-2 by FDA under an Emergency Use Authorization (EUA). This EUA will remain in effect (meaning this test can be used) for the duration of the COVID-19 declaration under Section 564(b)(1) of the Act, 21 U.S.C. section 360bbb-3(b)(1), unless the authorization is terminated or revoked.  Performed at [facility], [address], [place], [place] [address]   Blood Culture (routine x 2)     Status: None (Preliminary result)   Collection Time: 05/06/23  4:05 PM   Specimen: BLOOD RIGHT ARM  Result Value Ref Range Status   Specimen Description BLOOD RIGHT ARM BLOOD  Final   Special Requests   Final    BOTTLES DRAWN AEROBIC AND ANAEROBIC Blood Culture results may not be optimal due to an inadequate volume of blood received in culture bottles   Culture   Final    NO  GROWTH 4 DAYS Performed at [facility], [address] [place], [place] [address]    Report Status PENDING  Incomplete  Blood Culture (routine x 2)     Status: None (Preliminary result)   Collection Time: 05/06/23  7:30 PM   Specimen: BLOOD LEFT HAND  Result Value Ref Range Status   Specimen Description BLOOD LEFT HAND BLOOD  Final   Special Requests AEROBIC BOTTLE ONLY Blood Culture adequate volume  Final   Culture   Final    NO GROWTH 4 DAYS Performed at [facility], [address] [place], [place] [address]    Report Status PENDING  Incomplete         Radiology Studies: CT CORONARY MORPH W/CTA COR W/SCORE W/CA W/CM &/OR WO/CM Addendum Date: 05/09/2023 ADDENDUM REPORT: 05/09/2023 18:49 EXAM: OVER-READ INTERPRETATION  CT CHEST The following report is an over-read performed by radiologist Dr. Joen Laura [facility] on 05/09/2023. This over-read does not  include interpretation of cardiac or coronary anatomy or pathology. The coronary CTA interpretation by the cardiologist is attached. COMPARISON:  05/06/2023 FINDINGS: Mediastinum: No pathologic adenopathy. Small hiatal hernia. Visualized portions of the tracheobronchial tree and esophagus are unremarkable. The intraluminal thrombus within the aortic arch seen on prior study is only partially visualized on this exam. Lungs: Minimal tree in bud nodular airspace disease within the superior segment of the left lower lobe may be inflammatory or infectious. No effusion or pneumothorax. Upper abdomen: Unremarkable. Musculoskeletal: No acute or destructive bony abnormalities. IMPRESSION: 1. Minimal tree in bud nodular airspace disease within the superior segment of the left lower lobe, consistent with inflammatory or infectious etiology. 2. Partial visualization of the known intraluminal thrombus within the aortic arch. Please see recent CT angiography exam for full description of findings. 3. Small hiatal hernia. Electronically  Signed   By: Sharlet Salina M.D.   On: 05/09/2023 18:49   Result Date: 05/09/2023 CLINICAL DATA:  This is a 53 year old female with anginal symptoms EXAM: Cardiac/Coronary  CTA TECHNIQUE: The patient was scanned on a [employer]. FINDINGS: A 100 kV prospective scan was triggered in the descending thoracic aorta at 111 HU's. Axial non-contrast 3 mm slices were carried out through the heart. The data set was analyzed on a dedicated work station and scored using the Agatson method. Gantry rotation speed was 250 msecs and collimation was .6 mm. No beta blockade and 0.8 mg of sl NTG was given. The 3D data set was reconstructed in 5% intervals of the 67-82 % of the R-R cycle. Diastolic phases were analyzed on a dedicated work station using MPR, MIP and VRT modes. The patient received 80 cc of contrast. Image Quality: Fair with misregistration artifact. Aorta: Normal size.  No calcifications.  No dissection. Aortic Valve:  Trileaflet.  No calcifications. Coronary Arteries:  Normal coronary origin.  Right dominance. RCA is a large dominant artery that gives rise to PDA and PLA. There is a mild (25-49%) focal calcification in the mid LAD. The proximal and distal LAD with no plaques. Left main is a large artery that gives rise to LAD and LCX arteries. LAD is a large vessel. There is a mixed focal mild plaque in the mid LAD. The proximal and distal LAD with no plaques. LCX is a non-dominant artery that gives rise to one large OM1 branch. There is a mix mild plaque in the proximal to mid LCX. The mid and distal LCX with no plaques. Coronary Calcium Score: Left main: 0 Left anterior descending artery: 35.3 Left circumflex artery: 34.7 Right coronary artery: 11.1 Total: 81.1 Percentile: 95 Other findings: Normal pulmonary vein drainage into the left atrium. Normal left atrial appendage without a thrombus. Normal size of the pulmonary artery. IMPRESSION: 1. Coronary calcium score of 81.1. This was 95 percentile for  age and sex matched control. 2. Normal coronary origin with right dominance. 3. CAD-RADS 2. Mild non-obstructive CAD (25-49%). Consider non-atherosclerotic causes of chest pain. Consider preventive therapy and risk factor modification. 4. Total plaque volume is 97 mm3 which is 72nd percentile for age- and sex-matched controls (calcified plaque 11 mm3; non-calcified plaque 86 mm3, Low attenuation 1 mm3). TPV is mild. The noncardiac portion of this study will be interpreted in separate report by the radiologist. Electronically Signed: By: Thomasene Ripple D.O. On: 05/09/2023 15:06        Scheduled Meds:  atorvastatin  40 mg Oral Daily   citalopram  20 mg Oral Daily  cyanocobalamin  1,000 mcg Intramuscular Q Mon   fluticasone furoate-vilanterol  1 puff Inhalation Daily   folic acid  1 mg Oral Daily   multivitamin with minerals  1 tablet Oral Daily   oseltamivir  75 mg Oral BID   pantoprazole  40 mg Oral Daily   pregabalin  100 mg Oral BID   QUEtiapine  300 mg Oral QHS   [START ON 05/15/2023] thiamine (VITAMIN B1) injection  100 mg Intravenous Daily   Continuous Infusions:  heparin 1,150 Units/hr (05/10/23 0816)   thiamine (VITAMIN B1) injection Stopped (05/09/23 1010)     LOS: 3 days    Time spent: 40 minutes    Dorcas Carrow, MD [facility]

## 2023-05-10 NOTE — Progress Notes (Signed)
Cardiology Progress Note  Patient ID: Kathryn [name] MRN: [id number] DOB: [birth year] Date of Encounter: 05/10/2023 Primary Cardiologist: None  Subjective   Chief Complaint: None.   HPI: Discussed results of coronary CTA.  Nonobstructive CAD.  ROS:  All other ROS reviewed and negative. Pertinent positives noted in the HPI.     Telemetry  Overnight telemetry shows sinus rhythm 70s, which I personally reviewed.    Physical Exam   Vitals:   05/09/23 1950 05/09/23 2358 05/10/23 0416 05/10/23 0755  BP: 120/77 119/70 (!) 90/56 93/72  Pulse: 74 77 71 81  Resp: 18 17 18 17   Temp: 98.8 F (37.1 C) 98.6 F (37 C) 98.8 F (37.1 C) 98.8 F (37.1 C)  TempSrc: Oral Oral Oral Oral  SpO2: 100% 100% 98% 100%  Weight:      Height:        Intake/Output Summary (Last 24 hours) at 05/10/2023 1015 Last data filed at 05/10/2023 0301 Gross per 24 hour  Intake 812.82 ml  Output 400 ml  Net 412.82 ml       05/06/2023    8:31 PM  Last 3 Weights  Weight (lbs) 174 lb 1.6 oz  Weight (kg) 78.971 kg    Body mass index is 31.84 kg/m.  General: Well nourished, well developed, in no acute distress Head: Atraumatic, normal size  Eyes: PEERLA, EOMI  Neck: Supple, no JVD Endocrine: No thryomegaly Cardiac: Normal S1, S2; RRR; no murmurs, rubs, or gallops Lungs: Clear to auscultation bilaterally, no wheezing, rhonchi or rales  Abd: Soft, nontender, no hepatomegaly  Ext: No edema, pulses 2+ Musculoskeletal: No deformities, BUE and BLE strength normal and equal Skin: Warm and dry, no rashes   Neuro: Alert and oriented to person, place, time, and situation, CNII-XII grossly intact, no focal deficits  Psych: Normal mood and affect   Cardiac Studies  CCTA 05/09/2023 1. Coronary calcium score of 81.1. This was 95 percentile for age and sex matched control.   2. Normal coronary origin with right dominance.   3. CAD-RADS 2. Mild non-obstructive CAD (25-49%). Consider non-atherosclerotic causes of  chest pain. Consider preventive therapy and risk factor modification.   4. Total plaque volume is 97 mm3 which is 72nd percentile for age- and sex-matched controls (calcified plaque 11 mm3; non-calcified plaque 86 mm3, Low attenuation 1 mm3). TPV is mild.  Patient Profile  Kathryn [name] is a 53 y.o. female with cocaine abuse, COPD, hypertension who was admitted on 05/06/2023 with acute stroke, cocaine abuse and influenza.  She was also found to have large aortic arch thrombus.  Cardiology was consulted for preoperative assessment.  Assessment & Plan   # Preoperative assessment # Nonobstructive CAD -Coronary CTA yesterday with mild nonobstructive disease.  Continue Lipitor 40 mg daily. -Okay to hold aspirin.  She will be on indefinite anticoagulation. -We will arrange outpatient follow-up in 2 to 3 months. -Echo is normal.  Nonobstructive CAD.  May proceed to surgery at acceptable risk.  No further cardiac testing.  # Aortic arch thrombus -Likely indefinite anticoagulation.  Will need surgical intervention.  # Cardioembolic stroke -Secondary to thrombus.  # Cocaine abuse # Tobacco abuse  -Cessation advised  [facility] will sign off.   Medication Recommendations: As above Other recommendations (labs, testing, etc): None Follow up as an outpatient: 2 to 3 months with cardiology  For questions or updates, please contact  [facility] Please consult [email or web address] for contact info under         Signed,  Gerri Spore T. Flora Lipps, MD, [facility]  [facility]  05/10/2023 10:15 AM

## 2023-05-10 NOTE — Progress Notes (Signed)
PHARMACY - ANTICOAGULATION CONSULT NOTE  Pharmacy Consult for heparin Indication:  aortic thrombus  Allergies  Allergen Reactions   Motrin [Ibuprofen] Anaphylaxis   Flexeril [Cyclobenzaprine] Hives    Patient Measurements: Height: 5\' 2"  (157.5 cm) Weight: 79 kg (174 lb 1.6 oz) IBW/kg (Calculated) : 50.1 Heparin Dosing Weight: 67.5 kg  Vital Signs: Temp: 98.8 F (37.1 C) (01/23 0755) Temp Source: Oral (01/23 0755) BP: 93/72 (01/23 0755) Pulse Rate: 81 (01/23 0755)  Labs: Recent Labs    05/08/23 0833 05/08/23 1841 05/09/23 0543 05/09/23 1708 05/10/23 0620  HGB 12.1  --  11.7*  --  11.1*  HCT 36.9  --  35.1*  --  33.0*  PLT 462*  --  417*  --  400  HEPARINUNFRC 0.53   < > 0.19* 0.27* 0.31  CREATININE  --   --  0.82  --   --    < > = values in this interval not displayed.    Estimated Creatinine Clearance: 76.4 mL/min (by C-G formula based on SCr of 0.82 mg/dL).  Medical History: No past medical history on file.  Assessment: 53 y/o female presenting with altered mental status. PMH unknown. CT imaging shows Large intraluminal filling defect within the aortic arch, consistent with thromboembolism. Pharmacy has been consulted to initiate heparin infusion. Per chart review, patient is not on anticoagulation prior to admission.  Per neurology, will initiate infusion without bolus and target lower heparin level goal (0.3-0.5) due to multiple embolic CVA.  Heparin level came back therapeutic today but on the lower side. We will increase slightly and recheck in AM.   Goal of Therapy:  Heparin level 0.3-0.5 units/ml Monitor platelets by anticoagulation protocol: Yes   Plan:  Increase heparin infusion to 1150 units/hr Check anti-Xa level daily while on heparin Continue to monitor H&H and platelets F/u oral AC  Ulyses Southward, PharmD, BCIDP, AAHIVP, CPP Infectious Disease Pharmacist 05/10/2023 8:00 AM

## 2023-05-10 NOTE — Progress Notes (Signed)
PT Cancellation Note  Patient Details Name: Kathryn [name] MRN: [id number] DOB: 1970/06/08   Cancelled Treatment:    Reason Eval/Treat Not Completed: Fatigue/lethargy limiting ability to participate  Pt sleeping and difficult to arouse. Briefly opened eyes and immediately back to sleep. Will reattempt later today   Jerolyn Center, PT Acute Rehabilitation Services  Office [phone]   Zena Amos 05/10/2023, 11:14 AM

## 2023-05-10 NOTE — Plan of Care (Signed)
  Problem: Clinical Measurements: Goal: Ability to maintain clinical measurements within normal limits will improve Outcome: Not Progressing   Problem: Activity: Goal: Risk for activity intolerance will decrease Outcome: Not Progressing   Problem: Elimination: Goal: Will not experience complications related to urinary retention Outcome: Not Progressing   Problem: Elimination: Goal: Will not experience complications related to bowel motility Outcome: Not Progressing   Problem: Safety: Goal: Ability to remain free from injury will improve Outcome: Not Progressing   Problem: Ischemic Stroke/TIA Tissue Perfusion: Goal: Complications of ischemic stroke/TIA will be minimized Outcome: Not Progressing   Problem: Self-Care: Goal: Ability to participate in self-care as condition permits will improve Outcome: Not Progressing Goal: Verbalization of feelings and concerns over difficulty with self-care will improve Outcome: Not Progressing Goal: Ability to communicate needs accurately will improve Outcome: Not Progressing

## 2023-05-11 DIAGNOSIS — I7410 Embolism and thrombosis of unspecified parts of aorta: Secondary | ICD-10-CM | POA: Diagnosis not present

## 2023-05-11 DIAGNOSIS — I639 Cerebral infarction, unspecified: Secondary | ICD-10-CM | POA: Diagnosis not present

## 2023-05-11 LAB — CULTURE, BLOOD (ROUTINE X 2)
Culture: NO GROWTH
Culture: NO GROWTH
Special Requests: ADEQUATE

## 2023-05-11 LAB — HEPARIN LEVEL (UNFRACTIONATED): Heparin Unfractionated: 0.43 [IU]/mL (ref 0.30–0.70)

## 2023-05-11 LAB — CBC
HCT: 33 % — ABNORMAL LOW (ref 36.0–46.0)
Hemoglobin: 10.6 g/dL — ABNORMAL LOW (ref 12.0–15.0)
MCH: 28.6 pg (ref 26.0–34.0)
MCHC: 32.1 g/dL (ref 30.0–36.0)
MCV: 89.2 fL (ref 80.0–100.0)
Platelets: 435 10*3/uL — ABNORMAL HIGH (ref 150–400)
RBC: 3.7 MIL/uL — ABNORMAL LOW (ref 3.87–5.11)
RDW: 15.7 % — ABNORMAL HIGH (ref 11.5–15.5)
WBC: 8.1 10*3/uL (ref 4.0–10.5)
nRBC: 0 % (ref 0.0–0.2)

## 2023-05-11 LAB — URINALYSIS, ROUTINE W REFLEX MICROSCOPIC
Bilirubin Urine: NEGATIVE
Glucose, UA: NEGATIVE mg/dL
Hgb urine dipstick: NEGATIVE
Ketones, ur: NEGATIVE mg/dL
Nitrite: NEGATIVE
Protein, ur: 30 mg/dL — AB
Specific Gravity, Urine: 1.032 — ABNORMAL HIGH (ref 1.005–1.030)
pH: 5 (ref 5.0–8.0)

## 2023-05-11 LAB — VITAMIN B1: Vitamin B1 (Thiamine): 95.8 nmol/L (ref 66.5–200.0)

## 2023-05-11 MED ORDER — ORAL CARE MOUTH RINSE: 15.0000 mL | OROMUCOSAL | Status: DC | PRN

## 2023-05-11 MED ORDER — POLYETHYLENE GLYCOL 3350 17 G PO PACK
17.0000 g | PACK | Freq: Two times a day (BID) | ORAL | Status: DC
  Administered 2023-05-11 – 2023-05-18 (×9): 17 g via ORAL
  Filled 2023-05-11 (×10): qty 1

## 2023-05-11 MED ORDER — BISACODYL 10 MG RE SUPP
10.0000 mg | Freq: Once | RECTAL | Status: AC
  Administered 2023-05-11: 10 mg via RECTAL
  Filled 2023-05-11: qty 1

## 2023-05-11 MED ORDER — OXYCODONE HCL 5 MG PO TABS
5.0000 mg | ORAL_TABLET | ORAL | Status: DC | PRN
  Administered 2023-05-11 – 2023-05-18 (×28): 5 mg via ORAL
  Filled 2023-05-11 (×31): qty 1

## 2023-05-11 NOTE — Progress Notes (Signed)
[facility] night cross cover note:   I was notified by RN that the patient continues to complain of right-sided headache associated with some dizziness and light sensitivity, but otherwise no new acute focal neurologic deficits.  RN conveys that, per report, the patient has been experiencing a similar headache throughout the day.  The patient requests pain medication stronger than the existing order for prn acetaminophen to help address her headache.  Will refrain from NSAIDs given the patient's history of anaphylactic response to ibuprofen.  I subsequently placed order for prn oxycodone.    Newton Pigg, DO Hospitalist

## 2023-05-11 NOTE — Progress Notes (Signed)
PHARMACY - ANTICOAGULATION CONSULT NOTE  Pharmacy Consult for heparin Indication:  aortic thrombus  Allergies  Allergen Reactions   Motrin [Ibuprofen] Anaphylaxis   Flexeril [Cyclobenzaprine] Hives    Patient Measurements: Height: 5\' 2"  (157.5 cm) Weight: 79 kg (174 lb 1.6 oz) IBW/kg (Calculated) : 50.1 Heparin Dosing Weight: 67.5 kg  Vital Signs: Temp: 98.6 F (37 C) (01/24 0400) Temp Source: Oral (01/24 0400) BP: 106/66 (01/24 0400) Pulse Rate: 81 (01/24 0400)  Labs: Recent Labs    05/09/23 0543 05/09/23 1708 05/10/23 0620 05/11/23 0620  HGB 11.7*  --  11.1* 10.6*  HCT 35.1*  --  33.0* 33.0*  PLT 417*  --  400 435*  HEPARINUNFRC 0.19* 0.27* 0.31 0.43  CREATININE 0.82  --   --   --     Estimated Creatinine Clearance: 76.4 mL/min (by C-G formula based on SCr of 0.82 mg/dL).  Medical History: No past medical history on file.  Assessment: 53 y/o female presenting with altered mental status. PMH unknown. CT imaging shows Large intraluminal filling defect within the aortic arch, consistent with thromboembolism. Pharmacy has been consulted to initiate heparin infusion. Per chart review, patient is not on anticoagulation prior to admission.  Per neurology, will initiate infusion without bolus and target lower heparin level goal (0.3-0.5) due to multiple embolic CVA.  Heparin level came back therapeutic today. Possible thrombectomy next week.   Goal of Therapy:  Heparin level 0.3-0.5 units/ml Monitor platelets by anticoagulation protocol: Yes   Plan:  Cont heparin infusion to 1150 units/hr Check anti-Xa level daily while on heparin Continue to monitor H&H and platelets F/u oral AC  Ulyses Southward, PharmD, BCIDP, AAHIVP, CPP Infectious Disease Pharmacist 05/11/2023 7:58 AM

## 2023-05-11 NOTE — Hospital Course (Signed)
53 year old with history of COPD, hypertension, substance abuse, psychiatric disorder presented with being excessively lethargic at home. Patient was sleeping at home all day so her daughter called EMS. In the emergency room initially agitated then sleepy after Ativan. MRI brain consistent with multiple embolic infarcts. CTA without large vessel occlusion however it showed large thrombus at the aortic arch. CT angiogram of the aorta with above, no dissection. Admitted with neurology and CT surgery consultation.

## 2023-05-11 NOTE — Progress Notes (Signed)
  Progress Note   Patient: Kathryn [name] ZOX:[id number] DOB: Dec 08, 1970 DOA: 05/06/2023     4 DOS: the patient was seen and examined on 05/11/2023   Brief hospital course: 53 year old with history of COPD, hypertension, substance abuse, psychiatric disorder presented with being excessively lethargic at home. Patient was sleeping at home all day so her daughter called EMS. In the emergency room initially agitated then sleepy after Ativan. MRI brain consistent with multiple embolic infarcts. CTA without large vessel occlusion however it showed large thrombus at the aortic arch. CT angiogram of the aorta with above, no dissection. Admitted with neurology and CT surgery consultation.   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 Clinical findings, lethargic in the context of stroke, influenza A. CT head findings, no acute findings. MRI of the brain, multifocal bilateral ischemia.  No pressure effect. CTA of the head and neck with no large vessel occlusion.  Large intraluminal filling defect within the aortic arch. CT angiogram aorta, chest abdomen pelvis with no aortic dissection but large thrombus load on ascending aorta. 2D echocardiogram, normal EF.  No evidence of intracardiac thrombus. Antiplatelet therapy, none at home.  Currently tolerating heparin. LDL 91. Hemoglobin A1c, 5-7. anticipate lifelong anticoagulation.   Acute metabolic encephalopathy: Likely multifactorial.  Stroke, influenza, drugs.  Mental status improving.   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  Primary cause unknown.  Active cocaine use.  On IV heparin. CT surgery following, Continue heparin. She will need lifelong anticoagulation after the procedure. CT surgery asked for cardiac clearance.  Seen by cardiology.  Echocardiogram and CT coronaries essentially normal.  Cleared for surgery. Plans for repeat CTA next  weekend and if still present, then proceed with thrombectomy next Fri   Smoker and cocaine use: Nicotine provided.  Cessation done   Alcoholism: Drinks alcohol 1/5 of tequila daily along with a beer.  CIWA protocol.  Multivitamins.  Stable so far.  No evidence of alcohol withdrawal.   Influenza A: Positive for flu a PCR.  Tamiflu for 5 days.   AKI: Treated with IV fluid and improved.   Bipolar disorder: Patient on Celexa, Lyrica, Seroquel at home.  Started.   Essential hypertension: Blood pressure stable.  Currently not on any blood pressure medications.   Subjective: Without complaints  Physical Exam: Vitals:   05/11/23 0822 05/11/23 0843 05/11/23 1133 05/11/23 1500  BP:  (!) 116/59 112/66 115/74  Pulse:  92 89 91  Resp:  18 18 18   Temp:  99.1 F (37.3 C) 98.6 F (37 C) 98.8 F (37.1 C)  TempSrc:  Oral Oral Oral  SpO2: 99% 100% 100% 99%  Weight:      Height:       General exam: Awake, laying in bed, in nad Respiratory system: Normal respiratory effort, no wheezing Cardiovascular system: regular rate, s1, s2 Gastrointestinal system: Soft, nondistended, positive BS Central nervous system: CN2-12 grossly intact, strength intact Extremities: Perfused, no clubbing Skin: Normal skin turgor, no notable skin lesions seen Psychiatry: Mood normal // no visual hallucinations   Data Reviewed:  WBC 8.1, Hgb 10.6, Plts 435  Family Communication: Pt in room, family not at bedside  Disposition: Status is: Inpatient Remains inpatient appropriate because: severity of illness  Planned Discharge Destination: Home    Author: Rickey Barbara, MD 05/11/2023 3:22 PM  For on call review [email or web address].

## 2023-05-11 NOTE — Plan of Care (Signed)
  Problem: Clinical Measurements: Goal: Diagnostic test results will improve Outcome: Not Progressing   Problem: Clinical Measurements: Goal: Cardiovascular complication will be avoided Outcome: Not Progressing   Problem: Elimination: Goal: Will not experience complications related to bowel motility Outcome: Not Progressing   Problem: Ischemic Stroke/TIA Tissue Perfusion: Goal: Complications of ischemic stroke/TIA will be minimized Outcome: Not Progressing

## 2023-05-11 NOTE — Progress Notes (Signed)
Occupational Therapy Treatment Patient Details Name: Kathryn [name] MRN: [id number] DOB: [birth year] Today's Date: 05/11/2023   History of present illness 53 y.o. female adm 1/19 with medical history significant of COPD, HTN, substance abuse.     Pt LKW 2300 on 1/18.     Pt sleeping all day today so family brought pt to ED.     MRI bain demonstrates embolic infarcts.     CTA = no LVO     Work up ultimately demonstrated the very likely source to be a aortic arch thrombus. Likely to have thrombectomy, however date not yet finalized.   OT comments  Patient with incremental progress toward goals.  Patient has a better understanding of why she is in the hospital, asking about her procedure and when it may happen.  Otherwise, no significant progress from a functional progress.  Patient remains at a supervised level given cognitive deficits.  OT will continue efforts and reassess post procedure.  Patient will benefit from continued inpatient follow up therapy, <3 hours/day.      If plan is discharge home, recommend the following:  Assist for transportation;Assistance with cooking/housework;Supervision due to cognitive status;A little help with walking and/or transfers;A little help with bathing/dressing/bathroom;Direct supervision/assist for financial management;Direct supervision/assist for medications management   Equipment Recommendations  None recommended by OT    Recommendations for Other Services      Precautions / Restrictions Precautions Precautions: Fall Restrictions Weight Bearing Restrictions Per Provider Order: No       Mobility Bed Mobility   Bed Mobility: Supine to Sit, Sit to Supine     Supine to sit: Supervision Sit to supine: Supervision        Transfers Overall transfer level: Needs assistance Equipment used: None Transfers: Sit to/from Stand, Bed to chair/wheelchair/BSC Sit to Stand: Supervision     Step pivot transfers: Supervision           Balance    Sitting-balance support: Feet supported Sitting balance-Leahy Scale: Good     Standing balance support: No upper extremity supported Standing balance-Leahy Scale: Fair Standing balance comment: short stride length with shuffling gait                           ADL either performed or assessed with clinical judgement   ADL       Grooming: Wash/dry hands;Wash/dry face;Sitting;Supervision/safety;Standing               Lower Body Dressing: Supervision/safety;Sit to/from stand   Toilet Transfer: Regular Toilet;Ambulation;Supervision/safety                  Extremity/Trunk Assessment Upper Extremity Assessment Upper Extremity Assessment: Overall WFL for tasks assessed   Lower Extremity Assessment Lower Extremity Assessment: Defer to PT evaluation   Cervical / Trunk Assessment Cervical / Trunk Assessment: Normal    Vision Patient Visual Report: No change from baseline     Perception Perception Perception: Not tested   Praxis Praxis Praxis: Not tested    Cognition Arousal: Alert Behavior During Therapy: [facility] for tasks assessed/performed                     Orientation Level: Disoriented to, Time   Memory: Decreased short-term memory Following Commands: Follows one step commands with increased time Safety/Judgement: Decreased awareness of deficits Awareness: Intellectual Problem Solving: Slow processing, Requires verbal cues  Pertinent Vitals/ Pain       Pain Assessment Pain Assessment: Faces Faces Pain Scale: Hurts little more Pain Location: Head Pain Descriptors / Indicators: Headache Pain Intervention(s): Premedicated before session                                                          Frequency  Min 1X/week        Progress Toward Goals  OT Goals(current goals can now be found in the care plan section)  Progress towards OT goals: Progressing toward  goals  Acute Rehab OT Goals OT Goal Formulation: With patient Time For Goal Achievement: 05/22/23 Potential to Achieve Goals: Fair  Plan      Co-evaluation                 AM-PAC OT "6 Clicks" Daily Activity     Outcome Measure   Help from another person eating meals?: None Help from another person taking care of personal grooming?: A Little Help from another person toileting, which includes using toliet, bedpan, or urinal?: A Little Help from another person bathing (including washing, rinsing, drying)?: A Little Help from another person to put on and taking off regular upper body clothing?: None Help from another person to put on and taking off regular lower body clothing?: A Little 6 Click Score: 20    End of Session    OT Visit Diagnosis: Unsteadiness on feet (R26.81);Other symptoms and signs involving cognitive function   Activity Tolerance Patient tolerated treatment well   Patient Left in bed;with call bell/phone within reach;with bed alarm set   Nurse Communication Mobility status        Time: 1610-9604 OT Time Calculation (min): 20 min  Charges: OT General Charges $OT Visit: 1 Visit OT Treatments $Self Care/Home Management : 8-22 mins  05/11/2023  RP, OTR/L  Acute Rehabilitation Services  Office:  [phone]   [name] 05/11/2023, 12:55 PM

## 2023-05-11 NOTE — TOC Progression Note (Signed)
Transition of Care [facility]) - Progression Note    Patient Details  Name: Kathryn [name] MRN: [id number] Date of Birth: 1970/12/03  Transition of Care [facility]) CM/SW Contact  Kermit Balo, RN Phone Number: 05/11/2023, 2:08 PM  Clinical Narrative:     Plan is for thrombectomy next week.  TOC following.  Expected Discharge Plan: Skilled Nursing Facility Barriers to Discharge: Continued Medical Work up  Expected Discharge Plan and Services   Discharge Planning Services: CM Consult Post Acute Care Choice: IP Rehab Living arrangements for the past 2 months: Single Family Home                                       Social Determinants of Health (SDOH) Interventions SDOH Screenings   Food Insecurity: No Food Insecurity (05/09/2023)  Housing: High Risk (05/09/2023)  Transportation Needs: No Transportation Needs (05/09/2023)  Utilities: Not At Risk (05/09/2023)    Readmission Risk Interventions     No data to display

## 2023-05-11 NOTE — Progress Notes (Signed)
External catheter placed as patient was too sleepy and had incontinent episodes. Patient unaware that she had already peed on bed, bath provided and linens changed.

## 2023-05-11 NOTE — Progress Notes (Signed)
Speech Language Pathology Treatment: Cognitive-Linquistic  Patient Details Name: Kathryn [name] MRN: [id number] DOB: 07-21-1970 Today's Date: 05/11/2023 Time: 4098-1191 SLP Time Calculation (min) (ACUTE ONLY): 24 min  Assessment / Plan / Recommendation Clinical Impression  Pt seen for cognitive intervention. Initially she had decreased sustained attention and was focused on her ankle that was hurting and perseverating on finding housing. As session progressed she was calmer and better able to focus on tasks. She has partial awareness of her diagnosis and is aware of the blood clot in her heart and need for surgery sometime next week but was not aware and seemed surprised with hearing she had a stroke. Educated and discussed memory strategies and pt was given pencil and paper to use to write information re: activities completed or information to recall at a later date (prospective memory). She demonstrated use of writing down partial phone number (could not recall number and phone not present). She recalled part of OT session. She read a paragraph accurately and answered questions with 80% accuracy and answered questions re: a mock menu needing min cue for one question. She is making progress towards goals, continue ST.    HPI HPI: Kathryn [name] is a 53 y.o. female with medical history significant of COPD, HTN, substance abuse. Pt sleeping all day today so family brought pt to ED. MRI multifocal bilateral acute ischemia, greatest in the left parietal and occipital lobe, but also present in the posterior right  hemisphere      SLP Plan  Continue with current plan of care      Recommendations for follow up therapy are one component of a multi-disciplinary discharge planning process, led by the attending physician.  Recommendations may be updated based on patient status, additional functional criteria and insurance authorization.    Recommendations                         Frequent or  constant Supervision/Assistance Cognitive communication deficit (Y78.295)     Continue with current plan of care     Kathryn [name]  05/11/2023, 3:12 PM

## 2023-05-12 DIAGNOSIS — I639 Cerebral infarction, unspecified: Secondary | ICD-10-CM | POA: Diagnosis not present

## 2023-05-12 DIAGNOSIS — I7410 Embolism and thrombosis of unspecified parts of aorta: Secondary | ICD-10-CM | POA: Diagnosis not present

## 2023-05-12 LAB — COMPREHENSIVE METABOLIC PANEL
ALT: 17 U/L (ref 0–44)
AST: 21 U/L (ref 15–41)
Albumin: 2.9 g/dL — ABNORMAL LOW (ref 3.5–5.0)
Alkaline Phosphatase: 63 U/L (ref 38–126)
Anion gap: 9 (ref 5–15)
BUN: 16 mg/dL (ref 6–20)
CO2: 20 mmol/L — ABNORMAL LOW (ref 22–32)
Calcium: 8.6 mg/dL — ABNORMAL LOW (ref 8.9–10.3)
Chloride: 108 mmol/L (ref 98–111)
Creatinine, Ser: 1.15 mg/dL — ABNORMAL HIGH (ref 0.44–1.00)
GFR, Estimated: 57 mL/min — ABNORMAL LOW (ref >60)
Glucose, Bld: 85 mg/dL (ref 70–99)
Potassium: 3.9 mmol/L (ref 3.5–5.1)
Sodium: 137 mmol/L (ref 135–145)
Total Bilirubin: 0.4 mg/dL (ref 0.0–1.2)
Total Protein: 6.1 g/dL — ABNORMAL LOW (ref 6.5–8.1)

## 2023-05-12 LAB — HEPARIN LEVEL (UNFRACTIONATED): Heparin Unfractionated: 0.3 [IU]/mL (ref 0.30–0.70)

## 2023-05-12 LAB — CBC
HCT: 30.8 % — ABNORMAL LOW (ref 36.0–46.0)
Hemoglobin: 10 g/dL — ABNORMAL LOW (ref 12.0–15.0)
MCH: 29.2 pg (ref 26.0–34.0)
MCHC: 32.5 g/dL (ref 30.0–36.0)
MCV: 90.1 fL (ref 80.0–100.0)
Platelets: 475 10*3/uL — ABNORMAL HIGH (ref 150–400)
RBC: 3.42 MIL/uL — ABNORMAL LOW (ref 3.87–5.11)
RDW: 16 % — ABNORMAL HIGH (ref 11.5–15.5)
WBC: 7.2 10*3/uL (ref 4.0–10.5)
nRBC: 0 % (ref 0.0–0.2)

## 2023-05-12 NOTE — Progress Notes (Signed)
  Progress Note   Patient: Kathryn [name] VHQ:[id number] DOB: [birth year] DOA: 05/06/2023     5 DOS: the patient was seen and examined on 05/12/2023   Brief hospital course: 53 year old with history of COPD, hypertension, substance abuse, psychiatric disorder presented with being excessively lethargic at home. Patient was sleeping at home all day so her daughter called EMS. In the emergency room initially agitated then sleepy after Ativan. MRI brain consistent with multiple embolic infarcts. CTA without large vessel occlusion however it showed large thrombus at the aortic arch. CT angiogram of the aorta with above, no dissection. Admitted with neurology and CT surgery consultation.   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 Clinical findings, lethargic in the context of stroke, influenza A. CT head findings, no acute findings. MRI of the brain, multifocal bilateral ischemia.  No pressure effect. CTA of the head and neck with no large vessel occlusion.  Large intraluminal filling defect within the aortic arch. CT angiogram aorta, chest abdomen pelvis with no aortic dissection but large thrombus load on ascending aorta. 2D echocardiogram, normal EF.  No evidence of intracardiac thrombus. Antiplatelet therapy, none at home.  Currently tolerating heparin. LDL 91. Hemoglobin A1c, 5-7. possible lifelong anticoagulation.   Acute metabolic encephalopathy: Likely multifactorial.  Stroke, influenza, drugs.  Mental status improving.   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  Primary cause unknown.  Active cocaine use.  On IV heparin. CT surgery following, Continue heparin. She will need lifelong anticoagulation after the procedure. CT surgery asked for cardiac clearance.  Seen by cardiology.  Echocardiogram and CT coronaries essentially normal.  Cleared for surgery. Plans for repeat CTA next  weekend and if still present, then proceed with thrombectomy next Fri   Smoker and cocaine use: Nicotine provided.  Cessation done   Alcoholism: Drinks alcohol 1/5 of tequila daily along with a beer.  CIWA protocol.  Multivitamins.  Stable so far.  No evidence of alcohol withdrawal.   Influenza A: Positive for flu a PCR.  Tamiflu for 5 days.   AKI: Treated with IV fluid and improved.   Bipolar disorder: Patient on Celexa, Lyrica, Seroquel at home.  Started.   Essential hypertension: Blood pressure stable.  Currently not on any blood pressure medications.   Subjective: No complaints  Physical Exam: Vitals:   05/12/23 0714 05/12/23 0736 05/12/23 1106 05/12/23 1536  BP: 107/72  (!) 98/58 110/69  Pulse: 80  87 83  Resp: 18  16 17   Temp: 98.1 F (36.7 C)  98.1 F (36.7 C) 99.2 F (37.3 C)  TempSrc: Oral  Oral Oral  SpO2: 100% 99% 99% 98%  Weight:      Height:       General exam: Conversant, in no acute distress Respiratory system: normal chest rise, clear, no audible wheezing Cardiovascular system: regular rhythm, s1-s2 Gastrointestinal system: Nondistended, nontender, pos BS Central nervous system: No seizures, no tremors Extremities: No cyanosis, no joint deformities Skin: No rashes, no pallor Psychiatry: Affect normal // no auditory hallucinations    Data Reviewed:  Labs reviewed: Na 137, K 3.9, Cr 1.15, WBC 7.2, Hgb 10.0, Plts 475  Family Communication: Pt in room, family not at bedside  Disposition: Status is: Inpatient Remains inpatient appropriate because: severity of illness  Planned Discharge Destination: Home    Author: Rickey Barbara, MD 05/12/2023 3:38 PM  For on call review [email or web address].

## 2023-05-12 NOTE — Progress Notes (Signed)
PHARMACY - ANTICOAGULATION CONSULT NOTE  Pharmacy Consult for heparin Indication:  aortic thrombus  Allergies  Allergen Reactions   Motrin [Ibuprofen] Anaphylaxis   Flexeril [Cyclobenzaprine] Hives    Patient Measurements: Height: 5\' 2"  (157.5 cm) Weight: 79 kg (174 lb 1.6 oz) IBW/kg (Calculated) : 50.1 Heparin Dosing Weight: 67.5 kg  Vital Signs: Temp: 98.1 F (36.7 C) (01/25 0714) Temp Source: Oral (01/25 0714) BP: 107/72 (01/25 0714) Pulse Rate: 80 (01/25 0714)  Labs: Recent Labs    05/10/23 0620 05/11/23 0620 05/12/23 0652  HGB 11.1* 10.6* 10.0*  HCT 33.0* 33.0* 30.8*  PLT 400 435* 475*  HEPARINUNFRC 0.31 0.43 0.30    Estimated Creatinine Clearance: 76.4 mL/min (by C-G formula based on SCr of 0.82 mg/dL).  Medical History: No past medical history on file.  Assessment: 53 y/o female presenting with altered mental status. PMH unknown. CT imaging shows Large intraluminal filling defect within the aortic arch, consistent with thromboembolism. Pharmacy has been consulted to initiate heparin infusion. Per chart review, patient is not on anticoagulation prior to admission.  Per neurology, will initiate infusion without bolus and target lower heparin level goal (0.3-0.5) due to multiple embolic CVA.  Heparin level just therapeutic at 0.3 today on 1150 units/hr. CBC stable and no signs of bleeding or issues with heparin running continuously per RN. Will increase heparin slightly and recheck in AM.  Goal of Therapy:  Heparin level 0.3-0.5 units/ml Monitor platelets by anticoagulation protocol: Yes   Plan:  Inc heparin slightly to 1200 units/hr Check anti-Xa level daily while on heparin Continue to monitor H&H and platelets F/u oral AC  Thank you for involving pharmacy in the patient's care.   Theotis Burrow, PharmD PGY1 Acute Care Pharmacy Resident  05/12/2023 7:54 AM

## 2023-05-12 NOTE — Plan of Care (Signed)

## 2023-05-13 DIAGNOSIS — I7410 Embolism and thrombosis of unspecified parts of aorta: Secondary | ICD-10-CM | POA: Diagnosis not present

## 2023-05-13 DIAGNOSIS — I639 Cerebral infarction, unspecified: Secondary | ICD-10-CM | POA: Diagnosis not present

## 2023-05-13 LAB — CBC
HCT: 32.1 % — ABNORMAL LOW (ref 36.0–46.0)
Hemoglobin: 10.5 g/dL — ABNORMAL LOW (ref 12.0–15.0)
MCH: 29.2 pg (ref 26.0–34.0)
MCHC: 32.7 g/dL (ref 30.0–36.0)
MCV: 89.2 fL (ref 80.0–100.0)
Platelets: 562 10*3/uL — ABNORMAL HIGH (ref 150–400)
RBC: 3.6 MIL/uL — ABNORMAL LOW (ref 3.87–5.11)
RDW: 16.2 % — ABNORMAL HIGH (ref 11.5–15.5)
WBC: 8.3 10*3/uL (ref 4.0–10.5)
nRBC: 0 % (ref 0.0–0.2)

## 2023-05-13 LAB — COMPREHENSIVE METABOLIC PANEL
ALT: 24 U/L (ref 0–44)
AST: 31 U/L (ref 15–41)
Albumin: 3 g/dL — ABNORMAL LOW (ref 3.5–5.0)
Alkaline Phosphatase: 73 U/L (ref 38–126)
Anion gap: 7 (ref 5–15)
BUN: 11 mg/dL (ref 6–20)
CO2: 21 mmol/L — ABNORMAL LOW (ref 22–32)
Calcium: 8.7 mg/dL — ABNORMAL LOW (ref 8.9–10.3)
Chloride: 110 mmol/L (ref 98–111)
Creatinine, Ser: 1.07 mg/dL — ABNORMAL HIGH (ref 0.44–1.00)
GFR, Estimated: >60 mL/min (ref >60)
Glucose, Bld: 79 mg/dL (ref 70–99)
Potassium: 4 mmol/L (ref 3.5–5.1)
Sodium: 138 mmol/L (ref 135–145)
Total Bilirubin: 0.4 mg/dL (ref 0.0–1.2)
Total Protein: 6.4 g/dL — ABNORMAL LOW (ref 6.5–8.1)

## 2023-05-13 LAB — HEPARIN LEVEL (UNFRACTIONATED): Heparin Unfractionated: 0.41 [IU]/mL (ref 0.30–0.70)

## 2023-05-13 NOTE — Plan of Care (Signed)
Problem: Education: Goal: Knowledge of General Education information will improve Description: Including pain rating scale, medication(s)/side effects and non-pharmacologic comfort measures 05/13/2023 1618 by Juluis Mire, RN Outcome: Progressing 05/13/2023 1618 by Juluis Mire, RN Outcome: Progressing   Problem: Health Behavior/Discharge Planning: Goal: Ability to manage health-related needs will improve 05/13/2023 1618 by Juluis Mire, RN Outcome: Progressing 05/13/2023 1618 by Juluis Mire, RN Outcome: Progressing   Problem: Clinical Measurements: Goal: Ability to maintain clinical measurements within normal limits will improve 05/13/2023 1618 by Juluis Mire, RN Outcome: Progressing 05/13/2023 1618 by Juluis Mire, RN Outcome: Progressing Goal: Will remain free from infection 05/13/2023 1618 by Juluis Mire, RN Outcome: Progressing 05/13/2023 1618 by Juluis Mire, RN Outcome: Progressing Goal: Diagnostic test results will improve 05/13/2023 1618 by Juluis Mire, RN Outcome: Progressing 05/13/2023 1618 by Juluis Mire, RN Outcome: Progressing Goal: Respiratory complications will improve 05/13/2023 1618 by Juluis Mire, RN Outcome: Progressing 05/13/2023 1618 by Juluis Mire, RN Outcome: Progressing Goal: Cardiovascular complication will be avoided 05/13/2023 1618 by Juluis Mire, RN Outcome: Progressing 05/13/2023 1618 by Juluis Mire, RN Outcome: Progressing   Problem: Activity: Goal: Risk for activity intolerance will decrease 05/13/2023 1618 by Juluis Mire, RN Outcome: Progressing 05/13/2023 1618 by Juluis Mire, RN Outcome: Progressing   Problem: Nutrition: Goal: Adequate nutrition will be maintained 05/13/2023 1618 by Juluis Mire, RN Outcome: Progressing 05/13/2023 1618 by Juluis Mire, RN Outcome: Progressing   Problem: Coping: Goal: Level  of anxiety will decrease 05/13/2023 1618 by Juluis Mire, RN Outcome: Progressing 05/13/2023 1618 by Juluis Mire, RN Outcome: Progressing   Problem: Elimination: Goal: Will not experience complications related to bowel motility 05/13/2023 1618 by Juluis Mire, RN Outcome: Progressing 05/13/2023 1618 by Juluis Mire, RN Outcome: Progressing Goal: Will not experience complications related to urinary retention 05/13/2023 1618 by Juluis Mire, RN Outcome: Progressing 05/13/2023 1618 by Juluis Mire, RN Outcome: Progressing   Problem: Pain Managment: Goal: General experience of comfort will improve and/or be controlled 05/13/2023 1618 by Juluis Mire, RN Outcome: Progressing 05/13/2023 1618 by Juluis Mire, RN Outcome: Progressing   Problem: Safety: Goal: Ability to remain free from injury will improve 05/13/2023 1618 by Juluis Mire, RN Outcome: Progressing 05/13/2023 1618 by Juluis Mire, RN Outcome: Progressing   Problem: Skin Integrity: Goal: Risk for impaired skin integrity will decrease 05/13/2023 1618 by Juluis Mire, RN Outcome: Progressing 05/13/2023 1618 by Juluis Mire, RN Outcome: Progressing   Problem: Education: Goal: Knowledge of disease or condition will improve 05/13/2023 1618 by Juluis Mire, RN Outcome: Progressing 05/13/2023 1618 by Juluis Mire, RN Outcome: Progressing Goal: Knowledge of secondary prevention will improve (MUST DOCUMENT ALL) 05/13/2023 1618 by Juluis Mire, RN Outcome: Progressing 05/13/2023 1618 by Juluis Mire, RN Outcome: Progressing Goal: Knowledge of patient specific risk factors will improve (DELETE if not current risk factor) 05/13/2023 1618 by Juluis Mire, RN Outcome: Progressing 05/13/2023 1618 by Juluis Mire, RN Outcome: Progressing   Problem: Ischemic Stroke/TIA Tissue Perfusion: Goal: Complications of  ischemic stroke/TIA will be minimized 05/13/2023 1618 by Juluis Mire, RN Outcome: Progressing 05/13/2023 1618 by Juluis Mire, RN Outcome: Progressing   Problem: Coping: Goal: Will verbalize positive feelings about self 05/13/2023 1618 by Juluis Mire, RN Outcome: Progressing 05/13/2023 1618 by Juluis Mire, RN Outcome: Progressing Goal: Will identify appropriate support needs 05/13/2023  1618 by Juluis Mire, RN Outcome: Progressing 05/13/2023 1618 by Juluis Mire, RN Outcome: Progressing   Problem: Health Behavior/Discharge Planning: Goal: Ability to manage health-related needs will improve 05/13/2023 1618 by Juluis Mire, RN Outcome: Progressing 05/13/2023 1618 by Juluis Mire, RN Outcome: Progressing Goal: Goals will be collaboratively established with patient/family 05/13/2023 1618 by Juluis Mire, RN Outcome: Progressing 05/13/2023 1618 by Juluis Mire, RN Outcome: Progressing   Problem: Self-Care: Goal: Ability to participate in self-care as condition permits will improve 05/13/2023 1618 by Juluis Mire, RN Outcome: Progressing 05/13/2023 1618 by Juluis Mire, RN Outcome: Progressing Goal: Verbalization of feelings and concerns over difficulty with self-care will improve 05/13/2023 1618 by Juluis Mire, RN Outcome: Progressing 05/13/2023 1618 by Juluis Mire, RN Outcome: Progressing Goal: Ability to communicate needs accurately will improve 05/13/2023 1618 by Juluis Mire, RN Outcome: Progressing 05/13/2023 1618 by Juluis Mire, RN Outcome: Progressing   Problem: Nutrition: Goal: Risk of aspiration will decrease 05/13/2023 1618 by Juluis Mire, RN Outcome: Progressing 05/13/2023 1618 by Juluis Mire, RN Outcome: Progressing Goal: Dietary intake will improve 05/13/2023 1618 by Juluis Mire, RN Outcome: Progressing 05/13/2023 1618 by Juluis Mire, RN Outcome: Progressing

## 2023-05-13 NOTE — Progress Notes (Signed)
  Progress Note   Patient: Kathryn [name] ZOX:[id number] DOB: 1971-04-13 DOA: 05/06/2023     6 DOS: the patient was seen and examined on 05/13/2023   Brief hospital course: 53 year old with history of COPD, hypertension, substance abuse, psychiatric disorder presented with being excessively lethargic at home. Patient was sleeping at home all day so her daughter called EMS. In the emergency room initially agitated then sleepy after Ativan. MRI brain consistent with multiple embolic infarcts. CTA without large vessel occlusion however it showed large thrombus at the aortic arch. CT angiogram of the aorta with above, no dissection. Admitted with neurology and CT surgery consultation.   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 Clinical findings, lethargic in the context of stroke, influenza A. CT head findings, no acute findings. MRI of the brain, multifocal bilateral ischemia.  No pressure effect. CTA of the head and neck with no large vessel occlusion.  Large intraluminal filling defect within the aortic arch. CT angiogram aorta, chest abdomen pelvis with no aortic dissection but large thrombus load on ascending aorta. 2D echocardiogram, normal EF.  No evidence of intracardiac thrombus. Antiplatelet therapy, none at home.  Currently tolerating heparin. LDL 91. Hemoglobin A1c, 5-7. Consideration for lifelong anticoagulation.   Acute metabolic encephalopathy: Likely multifactorial.  Stroke, influenza, drugs.  Mental status improving.   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  Primary cause unknown.  Active cocaine use.  On IV heparin. CT surgery following, Continue heparin. She will need lifelong anticoagulation after the procedure. CT surgery asked for cardiac clearance.  Seen by cardiology.  Echocardiogram and CT coronaries essentially normal.  Cleared for surgery. Plans for repeat CTA  next weekend and if still present, then proceed with thrombectomy next Fri   Smoker and cocaine use: Nicotine provided.  Cessation done   Alcoholism: Drinks alcohol 1/5 of tequila daily along with a beer.  CIWA protocol.  Multivitamins.  Stable so far.  No evidence of alcohol withdrawal.   Influenza A: Positive for flu a PCR.  Completed Tamiflu for 5 days.   AKI: Treated with IV fluid and improved.   Bipolar disorder: Patient on Celexa, Lyrica, Seroquel at home.  Started.   Essential hypertension: Blood pressure stable.  Currently not on any blood pressure medications.   Subjective: Without complaints  Physical Exam: Vitals:   05/13/23 0358 05/13/23 0732 05/13/23 0748 05/13/23 1110  BP: 101/64 93/62  116/82  Pulse: 85 78  91  Resp: 17 17  18   Temp: 97.7 F (36.5 C) 97.8 F (36.6 C)  97.8 F (36.6 C)  TempSrc: Oral Oral  Oral  SpO2: 96% 99% 97% 98%  Weight:      Height:       General exam: Conversant, in no acute distress Respiratory system: normal chest rise, clear, no audible wheezing Cardiovascular system: regular rhythm, s1-s2 Gastrointestinal system: Nondistended, nontender, pos BS Central nervous system: No seizures, no tremors Extremities: No cyanosis, no joint deformities Skin: No rashes, no pallor Psychiatry: Affect normal // no auditory hallucinations    Data Reviewed:  Labs reviewed: Na 138, K 4.0, Cr 1.07  Family Communication: Pt in room, family not at bedside  Disposition: Status is: Inpatient Remains inpatient appropriate because: severity of illness  Planned Discharge Destination: Home    Author: Rickey Barbara, MD 05/13/2023 4:41 PM  For on call review [email or web address].

## 2023-05-13 NOTE — Plan of Care (Signed)

## 2023-05-13 NOTE — Progress Notes (Signed)
PHARMACY - ANTICOAGULATION CONSULT NOTE  Pharmacy Consult for heparin Indication:  aortic thrombus  Allergies  Allergen Reactions   Motrin [Ibuprofen] Anaphylaxis   Flexeril [Cyclobenzaprine] Hives    Patient Measurements: Height: 5\' 2"  (157.5 cm) Weight: 79 kg (174 lb 1.6 oz) IBW/kg (Calculated) : 50.1 Heparin Dosing Weight: 67.5 kg  Vital Signs: Temp: 97.8 F (36.6 C) (01/26 0732) Temp Source: Oral (01/26 0732) BP: 93/62 (01/26 0732) Pulse Rate: 78 (01/26 0732)  Labs: Recent Labs    05/11/23 0620 05/12/23 1610 05/13/23 0637  HGB 10.6* 10.0* 10.5*  HCT 33.0* 30.8* 32.1*  PLT 435* 475* 562*  HEPARINUNFRC 0.43 0.30 0.41  CREATININE  --  1.15*  --     Estimated Creatinine Clearance: 54.5 mL/min (A) (by C-G formula based on SCr of 1.15 mg/dL (H)).  Medical History: No past medical history on file.  Assessment: 53 y/o female presenting with altered mental status. PMH unknown. CT imaging shows Large intraluminal filling defect within the aortic arch, consistent with thromboembolism. Pharmacy has been consulted to initiate heparin infusion. Per chart review, patient is not on anticoagulation prior to admission.  Per neurology, will initiate infusion without bolus and target lower heparin level goal (0.3-0.5) due to multiple embolic CVA.  Heparin level therapeutic at 0.41 today on 1200 units/hr. CBC stable and no signs of bleeding noted.   Goal of Therapy:  Heparin level 0.3-0.5 units/ml Monitor platelets by anticoagulation protocol: Yes   Plan:  Continue heparin at 1200 units/hr Check anti-Xa level daily while on heparin Continue to monitor H&H and platelets F/u oral AC  Thank you for involving pharmacy in the patient's care.   Theotis Burrow, PharmD PGY1 Acute Care Pharmacy Resident  05/13/2023 7:42 AM

## 2023-05-14 DIAGNOSIS — I7410 Embolism and thrombosis of unspecified parts of aorta: Secondary | ICD-10-CM | POA: Diagnosis not present

## 2023-05-14 DIAGNOSIS — I639 Cerebral infarction, unspecified: Secondary | ICD-10-CM | POA: Diagnosis not present

## 2023-05-14 LAB — CBC
HCT: 30 % — ABNORMAL LOW (ref 36.0–46.0)
Hemoglobin: 9.9 g/dL — ABNORMAL LOW (ref 12.0–15.0)
MCH: 29.5 pg (ref 26.0–34.0)
MCHC: 33 g/dL (ref 30.0–36.0)
MCV: 89.3 fL (ref 80.0–100.0)
Platelets: 553 10*3/uL — ABNORMAL HIGH (ref 150–400)
RBC: 3.36 MIL/uL — ABNORMAL LOW (ref 3.87–5.11)
RDW: 16.2 % — ABNORMAL HIGH (ref 11.5–15.5)
WBC: 7.1 10*3/uL (ref 4.0–10.5)
nRBC: 0 % (ref 0.0–0.2)

## 2023-05-14 LAB — COMPREHENSIVE METABOLIC PANEL
ALT: 31 U/L (ref 0–44)
AST: 35 U/L (ref 15–41)
Albumin: 2.9 g/dL — ABNORMAL LOW (ref 3.5–5.0)
Alkaline Phosphatase: 65 U/L (ref 38–126)
Anion gap: 8 (ref 5–15)
BUN: 8 mg/dL (ref 6–20)
CO2: 23 mmol/L (ref 22–32)
Calcium: 8.7 mg/dL — ABNORMAL LOW (ref 8.9–10.3)
Chloride: 107 mmol/L (ref 98–111)
Creatinine, Ser: 0.8 mg/dL (ref 0.44–1.00)
GFR, Estimated: >60 mL/min (ref >60)
Glucose, Bld: 87 mg/dL (ref 70–99)
Potassium: 3.9 mmol/L (ref 3.5–5.1)
Sodium: 138 mmol/L (ref 135–145)
Total Bilirubin: 0.4 mg/dL (ref 0.0–1.2)
Total Protein: 6.1 g/dL — ABNORMAL LOW (ref 6.5–8.1)

## 2023-05-14 LAB — HEPARIN LEVEL (UNFRACTIONATED)
Heparin Unfractionated: 0.22 [IU]/mL — ABNORMAL LOW (ref 0.30–0.70)
Heparin Unfractionated: 0.26 [IU]/mL — ABNORMAL LOW (ref 0.30–0.70)
Heparin Unfractionated: 0.4 [IU]/mL (ref 0.30–0.70)

## 2023-05-14 MED ORDER — THIAMINE MONONITRATE 100 MG PO TABS
250.0000 mg | ORAL_TABLET | Freq: Every day | ORAL | Status: DC
  Administered 2023-05-14 – 2023-05-18 (×5): 250 mg via ORAL
  Filled 2023-05-14 (×5): qty 3

## 2023-05-14 NOTE — Progress Notes (Signed)
  Progress Note   Patient: Kathryn [name] MVH:[id number] DOB: [birth year] DOA: 05/06/2023     7 DOS: the patient was seen and examined on 05/14/2023   Brief hospital course: 53 year old with history of COPD, hypertension, substance abuse, psychiatric disorder presented with being excessively lethargic at home. Patient was sleeping at home all day so her daughter called EMS. In the emergency room initially agitated then sleepy after Ativan. MRI brain consistent with multiple embolic infarcts. CTA without large vessel occlusion however it showed large thrombus at the aortic arch. CT angiogram of the aorta with above, no dissection. Admitted with neurology and CT surgery consultation.   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 Clinical findings, lethargic in the context of stroke, influenza A. CT head findings, no acute findings. MRI of the brain, multifocal bilateral ischemia.  No pressure effect. CTA of the head and neck with no large vessel occlusion.  Large intraluminal filling defect within the aortic arch. CT angiogram aorta, chest abdomen pelvis with no aortic dissection but large thrombus load on ascending aorta. 2D echocardiogram, normal EF.  No evidence of intracardiac thrombus. Antiplatelet therapy, none at home.  Currently tolerating heparin. LDL 91. Hemoglobin A1c, 5-7. Consideration for lifelong anticoagulation.   Acute metabolic encephalopathy: Likely multifactorial.  Stroke, influenza, drugs.  Mental status improving.   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  Primary cause unknown.  Active cocaine use.  On IV heparin. CT surgery following, Continue heparin. She will need lifelong anticoagulation after the procedure. CT surgery asked for cardiac clearance.  Seen by cardiology.  Echocardiogram and CT coronaries essentially normal.  Cleared for surgery.  -plan for  thrombectomy per CTS   Smoker and cocaine use: Nicotine provided.  Cessation done   Alcoholism: Drinks alcohol 1/5 of tequila daily along with a beer.  CIWA protocol.  Multivitamins.  Stable so far.  No evidence of alcohol withdrawal.   Influenza A: Positive for flu a PCR.  Completed Tamiflu for 5 days.   AKI: Treated with IV fluid and improved.   Bipolar disorder: Patient on Celexa, Lyrica, Seroquel at home.  Started.   Essential hypertension: Blood pressure stable.  Currently not on any blood pressure medications.   Subjective: No complaints this AM  Physical Exam: Vitals:   05/14/23 0041 05/14/23 0808 05/14/23 0833 05/14/23 1124  BP: 110/69 124/78  112/67  Pulse: 79 87  88  Resp: 17 18  16   Temp: 97.8 F (36.6 C) 98 F (36.7 C)  98.2 F (36.8 C)  TempSrc: Oral Oral  Oral  SpO2: 99% 100% 99% 96%  Weight:      Height:       General exam: Awake, laying in bed, in nad Respiratory system: Normal respiratory effort, no wheezing Cardiovascular system: regular rate, s1, s2 Gastrointestinal system: Soft, nondistended, positive BS Central nervous system: CN2-12 grossly intact, strength intact Extremities: Perfused, no clubbing Skin: Normal skin turgor, no notable skin lesions seen Psychiatry: Mood normal // no visual hallucinations    Data Reviewed:  Labs reviewed: Na 138, K 3.9, Cr 0.80  Family Communication: Pt in room, family at bedside  Disposition: Status is: Inpatient Remains inpatient appropriate because: severity of illness  Planned Discharge Destination: Home    Author: Rickey Barbara, MD 05/14/2023 4:52 PM  For on call review [email or web address].

## 2023-05-14 NOTE — TOC Progression Note (Signed)
Transition of Care [facility]) - Progression Note    Patient Details  Name: Kathryn [name] MRN: [id number] Date of Birth: 1971/01/22  Transition of Care [facility]) CM/SW Contact  Kermit Balo, RN Phone Number: 05/14/2023, 2:40 PM  Clinical Narrative:     Awaiting thrombectomy this week.  TOC following.  Expected Discharge Plan: Skilled Nursing Facility Barriers to Discharge: Continued Medical Work up  Expected Discharge Plan and Services   Discharge Planning Services: CM Consult Post Acute Care Choice: IP Rehab Living arrangements for the past 2 months: Single Family Home                                       Social Determinants of Health (SDOH) Interventions SDOH Screenings   Food Insecurity: No Food Insecurity (05/09/2023)  Housing: High Risk (05/09/2023)  Transportation Needs: No Transportation Needs (05/09/2023)  Utilities: Not At Risk (05/09/2023)    Readmission Risk Interventions     No data to display

## 2023-05-14 NOTE — Progress Notes (Signed)
Physical Therapy Treatment Patient Details Name: Kathryn [name] MRN: [id number] DOB: 1970/08/29 Today's Date: 05/14/2023   History of Present Illness 53 y.o. female adm with lethargy on 1/19. Found to be +for influenza A, MRI of brain revealed multiple embolic infarcts with likley source to be a thrombus in aortic arch. Will have a thrombectomy but no date finalized yet PMH: COPD, HTN, substance abuse.    PT Comments  Pt tangential and easily distracted however is redirectable and appreciative of services. Pt with decreased insight to deficits and safety. Pt with onset of dizziness with ambulation and headache when laying down. Aware pt awaiting thrombectomy therefore limited amb to short distance in room. Pt unsteady with LOB and noted SOB requiring minA from PT to prevent fall. Acute PT to cont to follow, but wont return until s/p thrombectomy unless otherwise directed by MD.    If plan is discharge home, recommend the following: A little help with walking and/or transfers;Assistance with cooking/housework;Direct supervision/assist for medications management;Direct supervision/assist for financial management;Assist for transportation;Supervision due to cognitive status;Help with stairs or ramp for entrance   Can travel by private vehicle        Equipment Recommendations  Rolling walker (2 wheels)    Recommendations for Other Services       Precautions / Restrictions Precautions Precautions: Fall Restrictions Weight Bearing Restrictions Per Provider Order: No     Mobility  Bed Mobility Overal bed mobility: Needs Assistance Bed Mobility: Supine to Sit     Supine to sit: Supervision     General bed mobility comments: for safety and lines    Transfers Overall transfer level: Needs assistance Equipment used: None Transfers: Sit to/from Stand, Bed to chair/wheelchair/BSC Sit to Stand: Contact guard assist           General transfer comment: contact guard for safety, wide  base of support    Ambulation/Gait Ambulation/Gait assistance: Min assist Gait Distance (Feet): 20 Feet Assistive device: IV Pole Gait Pattern/deviations: Step-through pattern, Decreased stride length, Shuffle, Wide base of support   Gait velocity interpretation: <1.31 ft/sec, indicative of household ambulator   General Gait Details: c/o dizziness while amb; distance limited due to aortic thrombus and awaiting thrombectomy, pt also requiring minA due to instability and freq LOB   Stairs             Wheelchair Mobility     Tilt Bed    Modified Rankin (Stroke Patients Only) Modified Rankin (Stroke Patients Only) Pre-Morbid Rankin Score: No symptoms Modified Rankin: Moderately severe disability     Balance Overall balance assessment: Needs assistance Sitting-balance support: Feet supported Sitting balance-Leahy Scale: Good Sitting balance - Comments: pt able to sit and don socks without difficulty bringing ankle to knee not bending over to the floor   Standing balance support: No upper extremity supported Standing balance-Leahy Scale: Poor Standing balance comment: guarded, braces self with arms in abduction, wide base of support                            Cognition Arousal: Alert Behavior During Therapy: WFL for tasks assessed/performed Overall Cognitive Status: No family/caregiver present to determine baseline cognitive functioning                                 General Comments: pt tangential but agreeable and appreciative of services. Pt with decreased insight to over all health  and deficits. Pt states she is dizzy and has noted impaired balance however continues to try to walk/mobilize in room        Exercises      General Comments General comments (skin integrity, edema, etc.): pt reports her IV came out, per RN pt pulled it out earlier      Pertinent Vitals/Pain Pain Assessment Pain Assessment: 0-10 Pain Score: 5  Pain  Location: headache Pain Descriptors / Indicators: Headache Pain Intervention(s): Premedicated before session    Home Living                          Prior Function            PT Goals (current goals can now be found in the care plan section) Acute Rehab PT Goals PT Goal Formulation: With patient Time For Goal Achievement: 05/24/23 Potential to Achieve Goals: Good Progress towards PT goals: Not progressing toward goals - comment (awaiting thrombectomy)    Frequency    Min 3X/week      PT Plan      Co-evaluation              AM-PAC PT "6 Clicks" Mobility   Outcome Measure  Help needed turning from your back to your side while in a flat bed without using bedrails?: None Help needed moving from lying on your back to sitting on the side of a flat bed without using bedrails?: A Little Help needed moving to and from a bed to a chair (including a wheelchair)?: A Little Help needed standing up from a chair using your arms (e.g., wheelchair or bedside chair)?: A Little Help needed to walk in hospital room?: A Little Help needed climbing 3-5 steps with a railing? : A Lot 6 Click Score: 18    End of Session Equipment Utilized During Treatment: Gait belt Activity Tolerance: Patient tolerated treatment well Patient left: in chair;with call bell/phone within reach;with chair alarm set Nurse Communication: Mobility status PT Visit Diagnosis: Unsteadiness on feet (R26.81);Other abnormalities of gait and mobility (R26.89);Dizziness and giddiness (R42)     Time: 9147-8295 PT Time Calculation (min) (ACUTE ONLY): 21 min  Charges:    $Gait Training: 8-22 mins PT General Charges $$ ACUTE PT VISIT: 1 Visit                     Kathryn [name], PT, DPT Acute Rehabilitation Services Secure chat preferred Office #: [phone]    Kathryn [name] 05/14/2023, 2:29 PM

## 2023-05-14 NOTE — Plan of Care (Signed)

## 2023-05-14 NOTE — Progress Notes (Signed)
PHARMACY - ANTICOAGULATION CONSULT NOTE  Pharmacy Consult for heparin Indication:  aortic thrombus  Allergies  Allergen Reactions   Motrin [Ibuprofen] Anaphylaxis   Flexeril [Cyclobenzaprine] Hives    Patient Measurements: Height: 5\' 2"  (157.5 cm) Weight: 79 kg (174 lb 1.6 oz) IBW/kg (Calculated) : 50.1 Heparin Dosing Weight: 67.5 kg  Vital Signs: Temp: 97.8 F (36.6 C) (01/27 0041) Temp Source: Oral (01/27 0041) BP: 110/69 (01/27 0041) Pulse Rate: 79 (01/27 0041)  Labs: Recent Labs    05/12/23 4098 05/13/23 0637 05/14/23 0640  HGB 10.0* 10.5* 9.9*  HCT 30.8* 32.1* 30.0*  PLT 475* 562* 553*  HEPARINUNFRC 0.30 0.41 0.22*  CREATININE 1.15* 1.07*  --     Estimated Creatinine Clearance: 58.5 mL/min (A) (by C-G formula based on SCr of 1.07 mg/dL (H)).  Medical History: No past medical history on file.  Assessment: 53 y/o female presenting with altered mental status. PMH unknown. CT imaging shows Large intraluminal filling defect within the aortic arch, consistent with thromboembolism. Pharmacy has been consulted to initiate heparin infusion. Per chart review, patient is not on anticoagulation prior to admission.  Per neurology, will initiate infusion without bolus and target lower heparin level goal (0.3-0.5) due to multiple embolic CVA.  Heparin came back slightly low this AM. We will increase rate and recheck level.   Goal of Therapy:  Heparin level 0.3-0.5 units/ml Monitor platelets by anticoagulation protocol: Yes   Plan:  Increase heparin to 1300 units/hr Check 8 hr HL Check anti-Xa level daily while on heparin Continue to monitor H&H and platelets F/u oral AC  Ulyses Southward, PharmD, BCIDP, AAHIVP, CPP Infectious Disease Pharmacist 05/14/2023 7:58 AM

## 2023-05-14 NOTE — Progress Notes (Signed)
PHARMACY - ANTICOAGULATION CONSULT NOTE  Pharmacy Consult for heparin Indication:  aortic thrombus  Allergies  Allergen Reactions   Motrin [Ibuprofen] Anaphylaxis   Flexeril [Cyclobenzaprine] Hives    Patient Measurements: Height: 5\' 2"  (157.5 cm) Weight: 79 kg (174 lb 1.6 oz) IBW/kg (Calculated) : 50.1 Heparin Dosing Weight: 67.5 kg  Vital Signs: Temp: 98.6 F (37 C) (01/27 2008) Temp Source: Oral (01/27 2008) BP: 116/82 (01/27 2008) Pulse Rate: 84 (01/27 2008)  Labs: Recent Labs    05/12/23 1610 05/13/23 9604 05/14/23 0640 05/14/23 1456 05/14/23 2253  HGB 10.0* 10.5* 9.9*  --   --   HCT 30.8* 32.1* 30.0*  --   --   PLT 475* 562* 553*  --   --   HEPARINUNFRC 0.30 0.41 0.22* 0.26* 0.40  CREATININE 1.15* 1.07* 0.80  --   --     Estimated Creatinine Clearance: 78.3 mL/min (by C-G formula based on SCr of 0.8 mg/dL).  Medical History: No past medical history on file.  Assessment: 53 y/o female presenting with altered mental status. PMH unknown. CT imaging shows Large intraluminal filling defect within the aortic arch, consistent with thromboembolism. Pharmacy has been consulted to initiate heparin infusion. Per chart review, patient is not on anticoagulation prior to admission. Per neurology, will initiate infusion without bolus and target lower heparin level goal (0.3-0.5) due to multiple embolic CVA.   Heparin level 0.26 is subtherapeutic on 1300 units/h. Level drawn 6 hrs after rate change. CBC stable. . No issues with infusion or bleeding per RN.  1/27 PM update:  Heparin level therapeutic after rate increase  Goal of Therapy:  Heparin level 0.3-0.5 units/ml Monitor platelets by anticoagulation protocol: Yes   Plan:  Cont heparin at 1400 units/hr Heparin level and CBC with AM labs  Abran Duke, PharmD, BCPS Clinical Pharmacist Phone: [phone]

## 2023-05-14 NOTE — Progress Notes (Signed)
PHARMACY - ANTICOAGULATION CONSULT NOTE  Pharmacy Consult for heparin Indication:  aortic thrombus  Allergies  Allergen Reactions   Motrin [Ibuprofen] Anaphylaxis   Flexeril [Cyclobenzaprine] Hives    Patient Measurements: Height: 5\' 2"  (157.5 cm) Weight: 79 kg (174 lb 1.6 oz) IBW/kg (Calculated) : 50.1 Heparin Dosing Weight: 67.5 kg  Vital Signs: Temp: 98.2 F (36.8 C) (01/27 1124) Temp Source: Oral (01/27 1124) BP: 112/67 (01/27 1124) Pulse Rate: 88 (01/27 1124)  Labs: Recent Labs    05/12/23 0652 05/13/23 0637 05/14/23 0640 05/14/23 1456  HGB 10.0* 10.5* 9.9*  --   HCT 30.8* 32.1* 30.0*  --   PLT 475* 562* 553*  --   HEPARINUNFRC 0.30 0.41 0.22* 0.26*  CREATININE 1.15* 1.07* 0.80  --     Estimated Creatinine Clearance: 78.3 mL/min (by C-G formula based on SCr of 0.8 mg/dL).  Medical History: No past medical history on file.  Assessment: 53 y/o female presenting with altered mental status. PMH unknown. CT imaging shows Large intraluminal filling defect within the aortic arch, consistent with thromboembolism. Pharmacy has been consulted to initiate heparin infusion. Per chart review, patient is not on anticoagulation prior to admission. Per neurology, will initiate infusion without bolus and target lower heparin level goal (0.3-0.5) due to multiple embolic CVA.   Heparin level 0.26 is subtherapeutic on 1300 units/h. Level drawn 6 hrs after rate change. CBC stable. . No issues with infusion or bleeding per RN.  Goal of Therapy:  Heparin level 0.3-0.5 units/ml Monitor platelets by anticoagulation protocol: Yes   Plan:  Increase heparin to 1400 units/hr  F/u 8hr heparin level  Check anti-Xa level daily while on heparin Continue to monitor H&H and platelets F/u oral AC  Alphia Moh, PharmD, BCPS, BCCP Clinical Pharmacist  Please check AMION for all [facility] Pharmacy phone numbers After 10:00 PM, call Main Pharmacy [phone]

## 2023-05-15 DIAGNOSIS — I7410 Embolism and thrombosis of unspecified parts of aorta: Secondary | ICD-10-CM | POA: Diagnosis not present

## 2023-05-15 DIAGNOSIS — I639 Cerebral infarction, unspecified: Secondary | ICD-10-CM | POA: Diagnosis not present

## 2023-05-15 LAB — HEPARIN LEVEL (UNFRACTIONATED)
Heparin Unfractionated: 0.2 [IU]/mL — ABNORMAL LOW (ref 0.30–0.70)
Heparin Unfractionated: 0.15 [IU]/mL — ABNORMAL LOW (ref 0.30–0.70)

## 2023-05-15 LAB — CBC
HCT: 30.4 % — ABNORMAL LOW (ref 36.0–46.0)
Hemoglobin: 9.9 g/dL — ABNORMAL LOW (ref 12.0–15.0)
MCH: 29.2 pg (ref 26.0–34.0)
MCHC: 32.6 g/dL (ref 30.0–36.0)
MCV: 89.7 fL (ref 80.0–100.0)
Platelets: 468 10*3/uL — ABNORMAL HIGH (ref 150–400)
RBC: 3.39 MIL/uL — ABNORMAL LOW (ref 3.87–5.11)
RDW: 16.4 % — ABNORMAL HIGH (ref 11.5–15.5)
WBC: 7.3 10*3/uL (ref 4.0–10.5)
nRBC: 0 % (ref 0.0–0.2)

## 2023-05-15 LAB — COMPREHENSIVE METABOLIC PANEL
ALT: 35 U/L (ref 0–44)
AST: 36 U/L (ref 15–41)
Albumin: 3 g/dL — ABNORMAL LOW (ref 3.5–5.0)
Alkaline Phosphatase: 64 U/L (ref 38–126)
Anion gap: 12 (ref 5–15)
BUN: 7 mg/dL (ref 6–20)
CO2: 21 mmol/L — ABNORMAL LOW (ref 22–32)
Calcium: 9 mg/dL (ref 8.9–10.3)
Chloride: 106 mmol/L (ref 98–111)
Creatinine, Ser: 0.9 mg/dL (ref 0.44–1.00)
GFR, Estimated: >60 mL/min (ref >60)
Glucose, Bld: 121 mg/dL — ABNORMAL HIGH (ref 70–99)
Potassium: 4.1 mmol/L (ref 3.5–5.1)
Sodium: 139 mmol/L (ref 135–145)
Total Bilirubin: 0.2 mg/dL (ref 0.0–1.2)
Total Protein: 6.4 g/dL — ABNORMAL LOW (ref 6.5–8.1)

## 2023-05-15 MED ORDER — SUMATRIPTAN SUCCINATE 25 MG PO TABS
25.0000 mg | ORAL_TABLET | ORAL | Status: DC | PRN
  Administered 2023-05-15 – 2023-05-16 (×2): 25 mg via ORAL
  Filled 2023-05-15 (×4): qty 1

## 2023-05-15 NOTE — Plan of Care (Signed)
Problem: Education: Goal: Knowledge of General Education information will improve Description: Including pain rating scale, medication(s)/side effects and non-pharmacologic comfort measures Outcome: Progressing   Problem: Health Behavior/Discharge Planning: Goal: Ability to manage health-related needs will improve Outcome: Progressing   Problem: Clinical Measurements: Goal: Will remain free from infection Outcome: Progressing Goal: Diagnostic test results will improve Outcome: Progressing   Problem: Activity: Goal: Risk for activity intolerance will decrease Outcome: Progressing

## 2023-05-15 NOTE — Progress Notes (Signed)
Occupational Therapy Treatment Patient Details Name: Kathryn [name] MRN: [id number] DOB: 1971/02/13 Today's Date: 05/15/2023   History of present illness 53 y.o. female adm with lethargy on 1/19. Found to be +for influenza A, MRI of brain revealed multiple embolic infarcts with likley source to be a thrombus in aortic arch. Will have a thrombectomy but no date finalized yet PMH: COPD, HTN, substance abuse.   OT comments  Patient with similar functional presentation as last few sessions, no significant progress.  Treatment limited due to ongoing aortic arch thrombus with surgery pending.  Patient continues to c/o headache, so focus on limited mobility in the room.  OT can continue efforts in the acute setting, will attempt to be more aggressive post surgery.  Patient will benefit from continued inpatient follow up therapy, <3 hours/day.      If plan is discharge home, recommend the following:  Assist for transportation;Assistance with cooking/housework;Supervision due to cognitive status;A little help with walking and/or transfers;A little help with bathing/dressing/bathroom;Direct supervision/assist for financial management;Direct supervision/assist for medications management   Equipment Recommendations  None recommended by OT    Recommendations for Other Services      Precautions / Restrictions Precautions Precautions: Fall Restrictions Weight Bearing Restrictions Per Provider Order: No       Mobility Bed Mobility Overal bed mobility: Needs Assistance Bed Mobility: Supine to Sit, Sit to Supine     Supine to sit: Supervision Sit to supine: Supervision        Transfers Overall transfer level: Needs assistance Equipment used: None Transfers: Sit to/from Stand, Bed to chair/wheelchair/BSC Sit to Stand: Supervision                 Balance Overall balance assessment: Needs assistance Sitting-balance support: Feet supported Sitting balance-Leahy Scale: Good      Standing balance support: No upper extremity supported Standing balance-Leahy Scale: Poor Standing balance comment: reaching for objects in her environment                           ADL either performed or assessed with clinical judgement   ADL                           Toilet Transfer: Regular Toilet;Ambulation;Supervision/safety;Contact guard assist                  Extremity/Trunk Assessment Upper Extremity Assessment Upper Extremity Assessment: Overall WFL for tasks assessed   Lower Extremity Assessment Lower Extremity Assessment: Defer to PT evaluation   Cervical / Trunk Assessment Cervical / Trunk Assessment: Normal    Vision Patient Visual Report: No change from baseline     Perception     Praxis      Cognition Arousal: Alert Behavior During Therapy: WFL for tasks assessed/performed Overall Cognitive Status: No family/caregiver present to determine baseline cognitive functioning                         Following Commands: Follows one step commands with increased time, Follows one step commands consistently Safety/Judgement: Decreased awareness of deficits Awareness: Intellectual Problem Solving: Slow processing, Requires verbal cues          Exercises      Shoulder Instructions       General Comments      Pertinent Vitals/ Pain       Pain Assessment Pain Assessment: Faces Faces Pain Scale: Hurts a  little bit Pain Location: headache Pain Descriptors / Indicators: Headache Pain Intervention(s): Monitored during session                                                          Frequency  Min 1X/week        Progress Toward Goals  OT Goals(current goals can now be found in the care plan section)  Progress towards OT goals: Progressing toward goals  Acute Rehab OT Goals OT Goal Formulation: With patient Time For Goal Achievement: 05/22/23 Potential to Achieve Goals: Fair   Plan      Co-evaluation                 AM-PAC OT "6 Clicks" Daily Activity     Outcome Measure   Help from another person eating meals?: None Help from another person taking care of personal grooming?: A Little Help from another person toileting, which includes using toliet, bedpan, or urinal?: A Little Help from another person bathing (including washing, rinsing, drying)?: A Little Help from another person to put on and taking off regular upper body clothing?: None Help from another person to put on and taking off regular lower body clothing?: A Little 6 Click Score: 20    End of Session    OT Visit Diagnosis: Unsteadiness on feet (R26.81);Other symptoms and signs involving cognitive function   Activity Tolerance Treatment limited secondary to medical complications (Comment)   Patient Left in bed;with call bell/phone within reach;with bed alarm set   Nurse Communication Mobility status        Time: 1405-1420 OT Time Calculation (min): 15 min  Charges: OT General Charges $OT Visit: 1 Visit OT Treatments $Self Care/Home Management : 8-22 mins  05/15/2023  RP, OTR/L  Acute Rehabilitation Services  Office:  [phone]   [name] 05/15/2023, 2:56 PM

## 2023-05-15 NOTE — Progress Notes (Signed)
  Progress Note   Patient: Kathryn [name] BJY:[id number] DOB: [birth year] DOA: 05/06/2023     8 DOS: the patient was seen and examined on 05/15/2023   Brief hospital course: 53 year old with history of COPD, hypertension, substance abuse, psychiatric disorder presented with being excessively lethargic at home. Patient was sleeping at home all day so her daughter called EMS. In the emergency room initially agitated then sleepy after Ativan. MRI brain consistent with multiple embolic infarcts. CTA without large vessel occlusion however it showed large thrombus at the aortic arch. CT angiogram of the aorta with above, no dissection. Admitted with neurology and CT surgery consultation.   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 Clinical findings, lethargic in the context of stroke, influenza A. CT head findings, no acute findings. MRI of the brain, multifocal bilateral ischemia.  No pressure effect. CTA of the head and neck with no large vessel occlusion.  Large intraluminal filling defect within the aortic arch. CT angiogram aorta, chest abdomen pelvis with no aortic dissection but large thrombus load on ascending aorta. 2D echocardiogram, normal EF.  No evidence of intracardiac thrombus. Antiplatelet therapy, none at home.  Currently tolerating heparin. LDL 91. Hemoglobin A1c, 5-7. Possible lifelong anticoagulation per Dr. Flora Lipps   Acute metabolic encephalopathy: Likely multifactorial.  Stroke, influenza, drugs.  Mental status improving.   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  Primary cause unknown.  Active cocaine use.  On IV heparin currently CT surgery following,  She will need lifelong anticoagulation after the procedure per Cardiology Cleared for surgery by Cardiology -plan for thrombectomy per CTS, pending   Smoker and cocaine use: Nicotine provided.  Cessation done    Alcoholism: Drinks alcohol 1/5 of tequila daily along with a beer.  CIWA protocol.  Multivitamins.  Stable so far.  No evidence of alcohol withdrawal.   Influenza A: Positive for flu a PCR.  Completed Tamiflu for 5 days.   AKI: Treated with IV fluid and improved.   Bipolar disorder: Patient on Celexa, Lyrica, Seroquel at home.  Started.   Essential hypertension: Blood pressure stable.  Currently not on any blood pressure medications.   Subjective: Complaining of headache this AM  Physical Exam: Vitals:   05/15/23 0725 05/15/23 0925 05/15/23 1116 05/15/23 1529  BP: 96/63  92/60 125/84  Pulse: 79  86 81  Resp: 19  19   Temp: (!) 97.4 F (36.3 C)  98.6 F (37 C) 98.6 F (37 C)  TempSrc: Oral  Oral Oral  SpO2: 98% 98% 98% 99%  Weight:      Height:       General exam: Conversant, in no acute distress Respiratory system: normal chest rise, clear, no audible wheezing Cardiovascular system: regular rhythm, s1-s2 Gastrointestinal system: Nondistended, nontender, pos BS Central nervous system: No seizures, no tremors Extremities: No cyanosis, no joint deformities Skin: No rashes, no pallor Psychiatry: Affect normal // no auditory hallucinations   Data Reviewed:  Labs reviewed: Na 139, K 4.1, Cr 0.90, WBC 7.3, Hgb 9.9, Plts 468  Family Communication: Pt in room, family at bedside  Disposition: Status is: Inpatient Remains inpatient appropriate because: severity of illness  Planned Discharge Destination: Home    Author: Rickey Barbara, MD 05/15/2023 4:16 PM  For on call review [email or web address].

## 2023-05-15 NOTE — Plan of Care (Signed)
Conts oh hep gtt. Awaiting date for thrombectomy.   Problem: Health Behavior/Discharge Planning: Goal: Ability to manage health-related needs will improve Outcome: Not Met (add Reason)   Problem: Coping: Goal: Level of anxiety will decrease Outcome: Not Met (add Reason)   Problem: Nutrition: Goal: Adequate nutrition will be maintained Outcome: Not Met (add Reason)   Problem: Pain Managment: Goal: General experience of comfort will improve and/or be controlled Outcome: Not Met (add Reason)   Problem: Skin Integrity: Goal: Risk for impaired skin integrity will decrease Outcome: Not Met (add Reason)   Problem: Safety: Goal: Ability to remain free from injury will improve Outcome: Not Met (add Reason)   Problem: Education: Goal: Knowledge of disease or condition will improve Outcome: Not Met (add Reason) Goal: Knowledge of secondary prevention will improve (MUST DOCUMENT ALL) Outcome: Not Met (add Reason) Goal: Knowledge of patient specific risk factors will improve (DELETE if not current risk factor) Outcome: Not Met (add Reason)   Problem: Ischemic Stroke/TIA Tissue Perfusion: Goal: Complications of ischemic stroke/TIA will be minimized Outcome: Not Met (add Reason)   Problem: Self-Care: Goal: Ability to participate in self-care as condition permits will improve Outcome: Not Met (add Reason) Goal: Verbalization of feelings and concerns over difficulty with self-care will improve Outcome: Not Met (add Reason) Goal: Ability to communicate needs accurately will improve Outcome: Not Met (add Reason)

## 2023-05-15 NOTE — Progress Notes (Signed)
PHARMACY - ANTICOAGULATION CONSULT NOTE  Pharmacy Consult for heparin Indication:  aortic thrombus  Allergies  Allergen Reactions   Motrin [Ibuprofen] Anaphylaxis   Flexeril [Cyclobenzaprine] Hives    Patient Measurements: Height: 5\' 2"  (157.5 cm) Weight: 79 kg (174 lb 1.6 oz) IBW/kg (Calculated) : 50.1 Heparin Dosing Weight: 67.5 kg  Vital Signs: Temp: 98 F (36.7 C) (01/28 2012) Temp Source: Oral (01/28 2012) BP: 110/67 (01/28 2012) Pulse Rate: 79 (01/28 2012)  Labs: Recent Labs    05/13/23 1610 05/14/23 0640 05/14/23 1456 05/14/23 2253 05/15/23 0525 05/15/23 1859  HGB 10.5* 9.9*  --   --  9.9*  --   HCT 32.1* 30.0*  --   --  30.4*  --   PLT 562* 553*  --   --  468*  --   HEPARINUNFRC 0.41 0.22*   < > 0.40 0.20* 0.15*  CREATININE 1.07* 0.80  --   --  0.90  --    < > = values in this interval not displayed.    Estimated Creatinine Clearance: 69.6 mL/min (by C-G formula based on SCr of 0.9 mg/dL).  Medical History: No past medical history on file.  Assessment: 53 y/o female presenting with altered mental status. PMH unknown. CT imaging shows Large intraluminal filling defect within the aortic arch, consistent with thromboembolism. Pharmacy has been consulted to initiate heparin infusion. Per chart review, patient is not on anticoagulation prior to admission. Per neurology, will initiate infusion without bolus and target lower heparin level goal (0.3-0.5) due to multiple embolic CVA.   Heparin level 0.15 is subtherapeutic and down trending despite increasing rate to 1550 units/hr.  No issues with infusion or bleeding per RN.   Goal of Therapy:  Heparin level 0.3-0.5 units/ml Monitor platelets by anticoagulation protocol: Yes   Plan:  Increase heparin 1700 units/hr Monitor daily heparin level, CBC, signs/symptoms of bleeding  F/u long term anticoagulation plan    Alphia Moh, PharmD, BCPS, BCCP Clinical Pharmacist  Please check AMION for all [facility]  phone numbers After 10:00 PM, call Main Pharmacy [phone]

## 2023-05-15 NOTE — Progress Notes (Signed)
PHARMACY - ANTICOAGULATION CONSULT NOTE  Pharmacy Consult for heparin Indication:  aortic thrombus  Allergies  Allergen Reactions   Motrin [Ibuprofen] Anaphylaxis   Flexeril [Cyclobenzaprine] Hives    Patient Measurements: Height: 5\' 2"  (157.5 cm) Weight: 79 kg (174 lb 1.6 oz) IBW/kg (Calculated) : 50.1 Heparin Dosing Weight: 67.5 kg  Vital Signs: Temp: 98.6 F (37 C) (01/28 1116) Temp Source: Oral (01/28 1116) BP: 92/60 (01/28 1116) Pulse Rate: 86 (01/28 1116)  Labs: Recent Labs    05/13/23 0637 05/14/23 0640 05/14/23 1456 05/14/23 2253 05/15/23 0525  HGB 10.5* 9.9*  --   --  9.9*  HCT 32.1* 30.0*  --   --  30.4*  PLT 562* 553*  --   --  468*  HEPARINUNFRC 0.41 0.22* 0.26* 0.40 0.20*  CREATININE 1.07* 0.80  --   --  0.90    Estimated Creatinine Clearance: 69.6 mL/min (by C-G formula based on SCr of 0.9 mg/dL).  Medical History: No past medical history on file.  Assessment: 53 y/o female presenting with altered mental status. PMH unknown. CT imaging shows Large intraluminal filling defect within the aortic arch, consistent with thromboembolism. Pharmacy has been consulted to initiate heparin infusion. Per chart review, patient is not on anticoagulation prior to admission. Per neurology, will initiate infusion without bolus and target lower heparin level goal (0.3-0.5) due to multiple embolic CVA.   Heparin level 0.26 is subtherapeutic on 1300 units/h. Level drawn 6 hrs after rate change. CBC stable. . No issues with infusion or bleeding per RN.  Heparin level dropped below goal this AM again. We will adjust and check another level.   Goal of Therapy:  Heparin level 0.3-0.5 units/ml Monitor platelets by anticoagulation protocol: Yes   Plan:  Increase heparin 1550 units/hr Check 8 hr HL Heparin level and CBC with AM labs  Ulyses Southward, PharmD, BCIDP, AAHIVP, CPP Infectious Disease Pharmacist 05/15/2023 11:34 AM

## 2023-05-16 ENCOUNTER — Inpatient Hospital Stay (HOSPITAL_COMMUNITY): Payer: Medicare HMO

## 2023-05-16 DIAGNOSIS — I639 Cerebral infarction, unspecified: Secondary | ICD-10-CM | POA: Diagnosis not present

## 2023-05-16 LAB — COMPREHENSIVE METABOLIC PANEL
ALT: 47 U/L — ABNORMAL HIGH (ref 0–44)
AST: 46 U/L — ABNORMAL HIGH (ref 15–41)
Albumin: 3.4 g/dL — ABNORMAL LOW (ref 3.5–5.0)
Alkaline Phosphatase: 91 U/L (ref 38–126)
Anion gap: 11 (ref 5–15)
BUN: 9 mg/dL (ref 6–20)
CO2: 23 mmol/L (ref 22–32)
Calcium: 9.3 mg/dL (ref 8.9–10.3)
Chloride: 101 mmol/L (ref 98–111)
Creatinine, Ser: 0.95 mg/dL (ref 0.44–1.00)
GFR, Estimated: >60 mL/min (ref >60)
Glucose, Bld: 92 mg/dL (ref 70–99)
Potassium: 4.5 mmol/L (ref 3.5–5.1)
Sodium: 135 mmol/L (ref 135–145)
Total Bilirubin: 0.4 mg/dL (ref 0.0–1.2)
Total Protein: 7.3 g/dL (ref 6.5–8.1)

## 2023-05-16 LAB — CBC
HCT: 33.7 % — ABNORMAL LOW (ref 36.0–46.0)
Hemoglobin: 11 g/dL — ABNORMAL LOW (ref 12.0–15.0)
MCH: 29.3 pg (ref 26.0–34.0)
MCHC: 32.6 g/dL (ref 30.0–36.0)
MCV: 89.9 fL (ref 80.0–100.0)
Platelets: 571 10*3/uL — ABNORMAL HIGH (ref 150–400)
RBC: 3.75 MIL/uL — ABNORMAL LOW (ref 3.87–5.11)
RDW: 16.3 % — ABNORMAL HIGH (ref 11.5–15.5)
WBC: 8 10*3/uL (ref 4.0–10.5)
nRBC: 0 % (ref 0.0–0.2)

## 2023-05-16 LAB — HEPARIN LEVEL (UNFRACTIONATED)
Heparin Unfractionated: 0.71 [IU]/mL — ABNORMAL HIGH (ref 0.30–0.70)
Heparin Unfractionated: 0.59 [IU]/mL (ref 0.30–0.70)

## 2023-05-16 MED ORDER — ACETAMINOPHEN 650 MG RE SUPP: 650.0000 mg | RECTAL | Status: DC | PRN

## 2023-05-16 MED ORDER — ACETAMINOPHEN 500 MG PO TABS
1000.0000 mg | ORAL_TABLET | Freq: Four times a day (QID) | ORAL | Status: DC | PRN
  Administered 2023-05-16: 1000 mg via ORAL
  Filled 2023-05-16: qty 2

## 2023-05-16 MED ORDER — ACETAMINOPHEN 160 MG/5ML PO SOLN: 650.0000 mg | ORAL | Status: DC | PRN

## 2023-05-16 MED ORDER — IOHEXOL 350 MG/ML SOLN
75.0000 mL | Freq: Once | INTRAVENOUS | Status: AC | PRN
  Administered 2023-05-16: 75 mL via INTRAVENOUS

## 2023-05-16 NOTE — Plan of Care (Signed)

## 2023-05-16 NOTE — Progress Notes (Signed)
PHARMACY - ANTICOAGULATION CONSULT NOTE  Pharmacy Consult for heparin Indication:  aortic thrombus  Allergies  Allergen Reactions   Motrin [Ibuprofen] Anaphylaxis   Flexeril [Cyclobenzaprine] Hives    Patient Measurements: Height: 5\' 2"  (157.5 cm) Weight: 79 kg (174 lb 1.6 oz) IBW/kg (Calculated) : 50.1 Heparin Dosing Weight: 67.5 kg  Vital Signs: Temp: 97.6 F (36.4 C) (01/29 1044) Temp Source: Oral (01/29 1044) BP: 104/56 (01/29 1044) Pulse Rate: 90 (01/29 1044)  Labs: Recent Labs    05/14/23 0640 05/14/23 1456 05/15/23 0525 05/15/23 1859 05/16/23 0554  HGB 9.9*  --  9.9*  --  11.0*  HCT 30.0*  --  30.4*  --  33.7*  PLT 553*  --  468*  --  571*  HEPARINUNFRC 0.22*   < > 0.20* 0.15* 0.71*  CREATININE 0.80  --  0.90  --  0.95   < > = values in this interval not displayed.    Estimated Creatinine Clearance: 65.9 mL/min (by C-G formula based on SCr of 0.95 mg/dL).  Medical History: No past medical history on file.  Assessment: 53 y/o female presenting with altered mental status. PMH unknown. CT imaging shows Large intraluminal filling defect within the aortic arch, consistent with thromboembolism. Pharmacy has been consulted to initiate heparin infusion. Per chart review, patient is not on anticoagulation prior to admission. Per neurology, will initiate infusion without bolus and target lower heparin level goal (0.3-0.5) due to multiple embolic CVA.   Heparin level 0.71 - no signs of bleeding per d/w RN.  Goal of Therapy:  Heparin level 0.3-0.5 units/ml Monitor platelets by anticoagulation protocol: Yes   Plan:  Decrease heparin to 1600 units/hr Monitor daily heparin level, CBC, signs/symptoms of bleeding  F/u long term anticoagulation plan   Rexford Maus, PharmD, BCPS 05/16/2023 12:45 PM

## 2023-05-16 NOTE — Progress Notes (Signed)
[facility] night cross cover note:   I was notified by RN that this patient, who is hospitalized with aortic thrombus, is complaining of some right shoulder discomfort with distal radiation, in the absence of any report of acute focal numbness, paresthesias, or acute focal weakness.  Not associated with any report of left-sided shoulder discomfort nor any chest pain or sob.   She has an existing order for oxycodone IR 5 mg p.o. every 4 hours as needed. Will proceed with a dose of this prn oxy IR, and I have also increased her dose of prn acetaminophen to 1 g p.o. every 6 hours as needed for pain to see if this offers any anti-inflammatory benefit for her discomfort.     Newton Pigg, DO Hospitalist

## 2023-05-16 NOTE — Progress Notes (Signed)
PHARMACY - ANTICOAGULATION CONSULT NOTE  Pharmacy Consult for heparin Indication:  aortic thrombus  Allergies  Allergen Reactions   Motrin [Ibuprofen] Anaphylaxis   Flexeril [Cyclobenzaprine] Hives    Patient Measurements: Height: 5\' 2"  (157.5 cm) Weight: 79 kg (174 lb 1.6 oz) IBW/kg (Calculated) : 50.1 Heparin Dosing Weight: 67.5 kg  Vital Signs: Temp: 98.2 F (36.8 C) (01/29 1612) Temp Source: Oral (01/29 1612) BP: 122/80 (01/29 1612) Pulse Rate: 82 (01/29 1612)  Labs: Recent Labs    05/14/23 0640 05/14/23 1456 05/15/23 0525 05/15/23 1859 05/16/23 0554 05/16/23 1834  HGB 9.9*  --  9.9*  --  11.0*  --   HCT 30.0*  --  30.4*  --  33.7*  --   PLT 553*  --  468*  --  571*  --   HEPARINUNFRC 0.22*   < > 0.20* 0.15* 0.71* 0.59  CREATININE 0.80  --  0.90  --  0.95  --    < > = values in this interval not displayed.    Estimated Creatinine Clearance: 65.9 mL/min (by C-G formula based on SCr of 0.95 mg/dL).  Medical History: No past medical history on file.  Assessment: 53 y/o female presenting with altered mental status. PMH unknown. CT imaging shows Large intraluminal filling defect within the aortic arch, consistent with thromboembolism. Pharmacy has been consulted to initiate heparin infusion. Per chart review, patient is not on anticoagulation prior to admission. Per neurology, will initiate infusion without bolus and target lower heparin level goal (0.3-0.5) due to multiple embolic CVA.   1/29 PM update: Heparin level 0.59 - no signs of bleeding per d/w RN.  Goal of Therapy:  Heparin level 0.3-0.5 units/ml Monitor platelets by anticoagulation protocol: Yes   Plan:  Decrease heparin to 1500 units/hr Next HL 1/30 0300 Monitor daily heparin level, CBC, signs/symptoms of bleeding  F/u long term anticoagulation plan   Greta Doom BS, PharmD, BCPS Clinical Pharmacist 05/16/2023 7:03 PM  Contact: [phone] after 3 PM  "Be curious, not judgmental..." -Debbora Dus

## 2023-05-16 NOTE — Progress Notes (Addendum)
  Progress Note   Patient: Kathryn [name] WUJ:[id number] DOB: Oct 01, 1970 DOA: 05/06/2023     9 DOS: the patient was seen and examined on 05/16/2023   Brief hospital course: 53 year old with history of COPD, hypertension, substance abuse, psychiatric disorder presented with being excessively lethargic at home. Patient was sleeping at home all day so her daughter called EMS. In the ER, MRI brain consistent with multiple embolic infarcts. CTA without large vessel occlusion however it showed large thrombus at the aortic arch. CT angiogram of the aorta with above, no dissection. Admitted with neurology and CT surgery consultation.   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 Clinical findings, lethargic in the context of stroke, influenza A MRI of the brain, multifocal bilateral ischemia.  No pressure effect. CTA of the head and neck with no large vessel occlusion.  Large intraluminal filling defect within the aortic arch. CT angiogram aorta, chest abdomen pelvis with no aortic dissection but large thrombus load on ascending aorta 2D echocardiogram, normal EF.  No evidence of intracardiac thrombus Antiplatelet therapy, none at home.  Currently tolerating IV heparin. LDL 91, Hemoglobin A1c 5.7 Possible lifelong anticoagulation per Dr. Flora Lipps, cardiology    Acute metabolic encephalopathy Improving Likely multifactorial.  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  Primary cause unknown.  Active cocaine use.  On IV heparin currently CT surgery following,  She will need lifelong anticoagulation after the procedure per Cardiology Repeat CTA with resolved AA thrombus   Tobacco and cocaine use Advised to quit Nicotine patch provided   Alcohol abuse  Drinks alcohol 1/5 of tequila daily along with a beer CIWA protocol.  Multivitamins   Influenza A Positive for flu a  PCR.  Completed Tamiflu for 5 days   Bipolar disorder Patient on Celexa, Lyrica, Seroquel at home   Essential hypertension Blood pressure stable.  Currently not on any blood pressure medications.      Subjective: Denies any new complaints  Physical Exam: Vitals:   05/16/23 0347 05/16/23 0824 05/16/23 0906 05/16/23 1044  BP: 116/71 121/80  (!) 104/56  Pulse: 76 82  90  Resp: 18 18  17   Temp: 97.9 F (36.6 C) 98.3 F (36.8 C)  97.6 F (36.4 C)  TempSrc: Oral Oral  Oral  SpO2: 100% 98% 98% 97%  Weight:      Height:       General: NAD  Cardiovascular: S1, S2 present Respiratory: CTAB Abdomen: Soft, nontender, nondistended, bowel sounds present Musculoskeletal: No bilateral pedal edema noted Skin: Normal Psychiatry: Normal mood   Family Communication: Pt in room, family at bedside  Disposition: Status is: Inpatient Remains inpatient appropriate because: severity of illness  Planned Discharge Destination: Home    Author: Briant Cedar, MD 05/16/2023 3:40 PM  For on call review [email or web address].

## 2023-05-17 ENCOUNTER — Other Ambulatory Visit (HOSPITAL_COMMUNITY): Payer: Self-pay

## 2023-05-17 ENCOUNTER — Telehealth (HOSPITAL_COMMUNITY): Payer: Self-pay | Admitting: Pharmacy Technician

## 2023-05-17 DIAGNOSIS — I639 Cerebral infarction, unspecified: Secondary | ICD-10-CM | POA: Diagnosis not present

## 2023-05-17 LAB — CBC
HCT: 31.8 % — ABNORMAL LOW (ref 36.0–46.0)
Hemoglobin: 10.3 g/dL — ABNORMAL LOW (ref 12.0–15.0)
MCH: 29.5 pg (ref 26.0–34.0)
MCHC: 32.4 g/dL (ref 30.0–36.0)
MCV: 91.1 fL (ref 80.0–100.0)
Platelets: 562 10*3/uL — ABNORMAL HIGH (ref 150–400)
RBC: 3.49 MIL/uL — ABNORMAL LOW (ref 3.87–5.11)
RDW: 16.5 % — ABNORMAL HIGH (ref 11.5–15.5)
WBC: 8.8 10*3/uL (ref 4.0–10.5)
nRBC: 0.2 % (ref 0.0–0.2)

## 2023-05-17 LAB — HEPARIN LEVEL (UNFRACTIONATED): Heparin Unfractionated: 0.5 [IU]/mL (ref 0.30–0.70)

## 2023-05-17 MED ORDER — APIXABAN 5 MG PO TABS
5.0000 mg | ORAL_TABLET | Freq: Two times a day (BID) | ORAL | Status: DC
  Administered 2023-05-17 – 2023-05-18 (×3): 5 mg via ORAL
  Filled 2023-05-17 (×3): qty 1

## 2023-05-17 NOTE — Progress Notes (Signed)
Physical Therapy Treatment Patient Details Name: Kathryn [name] MRN: [id number] DOB: Mar 31, 1971 Today's Date: 05/17/2023   History of Present Illness 53 y.o. female adm with lethargy on 1/19. Found to be +for influenza A, MRI of brain revealed multiple embolic infarcts with likley source to be a thrombus in aortic arch. 1/30 CTA with resolution of thrombus, no need for surgery. PMH: COPD, HTN, substance abuse.    PT Comments  Pt resting in bed on arrival and agreeable to session with steady progress towards acute goals. Pt limited this session by BLE pain with standing mobility, however able to progress gait distance with rollator support and grossly CGA for safety. Pt with noted increase in stability with rollator support as pt reaching out for support of furniture in room when ambulating without AD support at start of session. Pt without c/o dizziness this session. Discussed with supervising PT and updated equipment recommendation and pt was educated on continued rollator use to maximize functional independence, safety, and decrease risk for falls. Pt continues to benefit from skilled PT services to progress toward functional mobility goals.     If plan is discharge home, recommend the following: A little help with walking and/or transfers;Assistance with cooking/housework;Direct supervision/assist for medications management;Direct supervision/assist for financial management;Assist for transportation;Supervision due to cognitive status;Help with stairs or ramp for entrance   Can travel by private vehicle        Equipment Recommendations  Rollator (4 wheels)    Recommendations for Other Services       Precautions / Restrictions Precautions Precautions: Fall Restrictions Weight Bearing Restrictions Per Provider Order: No     Mobility  Bed Mobility Overal bed mobility: Needs Assistance Bed Mobility: Supine to Sit, Sit to Supine     Supine to sit: Supervision Sit to supine:  Supervision   General bed mobility comments: for safety and lines    Transfers Overall transfer level: Needs assistance Equipment used: None, Rollator (4 wheels) Transfers: Sit to/from Stand, Bed to chair/wheelchair/BSC Sit to Stand: Supervision, Contact guard assist           General transfer comment: supervision for safety from EOB and BSC, CGA to sit<>stand on rollatro seat with cues fot lock brakes before sitting    Ambulation/Gait Ambulation/Gait assistance: Contact guard assist Gait Distance (Feet): 15 Feet (+ 40 + 35) Assistive device: Rollator (4 wheels), None Gait Pattern/deviations: Step-through pattern, Decreased stride length, Shuffle, Wide base of support Gait velocity: decr     General Gait Details: wide BOS with R foot externally roated throughout, reaching for support of furniture for initial 15' to [facility], improved stability with rollator support, effortful steps due to c/o painful R knee and L foot, no overt LOB with rollator support, seated rest between bouts   Stairs             Wheelchair Mobility     Tilt Bed    Modified Rankin (Stroke Patients Only) Modified Rankin (Stroke Patients Only) Pre-Morbid Rankin Score: No symptoms Modified Rankin: Moderately severe disability     Balance Overall balance assessment: Needs assistance Sitting-balance support: Feet supported Sitting balance-Leahy Scale: Good Sitting balance - Comments: pt able to sit and don socks without difficulty bringing ankle to knee not bending over to the floor   Standing balance support: No upper extremity supported Standing balance-Leahy Scale: Poor Standing balance comment: reaching for objects in her environment without AD support  Cognition Arousal: Alert Behavior During Therapy: WFL for tasks assessed/performed Overall Cognitive Status: No family/caregiver present to determine baseline cognitive functioning Area of Impairment:  Following commands, Safety/judgement, Awareness, Problem solving, Memory                     Memory: Decreased short-term memory Following Commands: Follows one step commands with increased time, Follows one step commands consistently Safety/Judgement: Decreased awareness of deficits Awareness: Intellectual Problem Solving: Slow processing, Requires verbal cues General Comments: pt tangential but agreeable and appreciative of services. Pt with decreased insight to over all health and deficits.        Exercises      General Comments General comments (skin integrity, edema, etc.): VSS on RA      Pertinent Vitals/Pain Pain Assessment Pain Assessment: Faces Faces Pain Scale: Hurts little more Pain Location: R knee, L foot Pain Descriptors / Indicators: Grimacing, Guarding, Sore Pain Intervention(s): Limited activity within patient's tolerance, Monitored during session, Patient requesting pain meds-RN notified    Home Living                          Prior Function            PT Goals (current goals can now be found in the care plan section) Acute Rehab PT Goals PT Goal Formulation: With patient Time For Goal Achievement: 05/24/23 Progress towards PT goals: Progressing toward goals    Frequency    Min 3X/week      PT Plan      Co-evaluation              AM-PAC PT "6 Clicks" Mobility   Outcome Measure  Help needed turning from your back to your side while in a flat bed without using bedrails?: None Help needed moving from lying on your back to sitting on the side of a flat bed without using bedrails?: A Little Help needed moving to and from a bed to a chair (including a wheelchair)?: A Little Help needed standing up from a chair using your arms (e.g., wheelchair or bedside chair)?: A Little Help needed to walk in hospital room?: A Little Help needed climbing 3-5 steps with a railing? : A Lot 6 Click Score: 18    End of Session Equipment  Utilized During Treatment: Gait belt Activity Tolerance: Patient tolerated treatment well Patient left: with call bell/phone within reach;in bed Nurse Communication: Mobility status;Patient requests pain meds PT Visit Diagnosis: Unsteadiness on feet (R26.81);Other abnormalities of gait and mobility (R26.89);Dizziness and giddiness (R42)     Time: 0981-1914 PT Time Calculation (min) (ACUTE ONLY): 20 min  Charges:    $Gait Training: 8-22 mins PT General Charges $$ ACUTE PT VISIT: 1 Visit                     Rosalva Neary R. PTA Acute Rehabilitation Services Office: [phone]    Catalina Antigua 05/17/2023, 9:40 AM

## 2023-05-17 NOTE — Progress Notes (Addendum)
PHARMACY - ANTICOAGULATION CONSULT NOTE  Pharmacy Consult for heparin Indication:  aortic thrombus  Allergies  Allergen Reactions   Motrin [Ibuprofen] Anaphylaxis   Flexeril [Cyclobenzaprine] Hives    Patient Measurements: Height: 5\' 2"  (157.5 cm) Weight: 79 kg (174 lb 1.6 oz) IBW/kg (Calculated) : 50.1 Heparin Dosing Weight: 67.5 kg  Vital Signs: Temp: 98.1 F (36.7 C) (01/30 0345) Temp Source: Oral (01/30 0345) BP: 104/74 (01/30 0345) Pulse Rate: 85 (01/30 0345)  Labs: Recent Labs    05/15/23 0525 05/15/23 1859 05/16/23 0554 05/16/23 1834  HGB 9.9*  --  11.0*  --   HCT 30.4*  --  33.7*  --   PLT 468*  --  571*  --   HEPARINUNFRC 0.20* 0.15* 0.71* 0.59  CREATININE 0.90  --  0.95  --     Estimated Creatinine Clearance: 65.9 mL/min (by C-G formula based on SCr of 0.95 mg/dL).  Medical History: No past medical history on file.  Assessment: 53 y/o female presenting with altered mental status. PMH unknown. CT imaging shows Large intraluminal filling defect within the aortic arch, consistent with thromboembolism. Pharmacy has been consulted to initiate heparin infusion. Per chart review, patient is not on anticoagulation prior to admission. Per neurology, will initiate infusion without bolus and target lower heparin level goal (0.3-0.5) due to multiple embolic CVA.   Heparin level this AM therapeutic (upper end of goal) at 0.5 after rate decreased to heparin 1500 units/hr last night. Hgb low stable, plts elevated. No s/sx bleeding per d/w RN.   Goal of Therapy:  Heparin level 0.3-0.5 units/ml Monitor platelets by anticoagulation protocol: Yes   Plan:  Decrease heparin slightly to 1450 units/hr Monitor daily heparin level, CBC, signs/symptoms of bleeding  F/u long term anticoagulation plan   Rexford Maus, PharmD, BCPS 05/17/2023 7:07 AM  PM UPDATE: Aortic thrombus resolved on CTA, so CT surgery cancelling thrombectomy for 1/31. Transition to Eliquis given  multiple acute infarcts. Will not load Eliquis given recent strokes and has been on IV heparin since 1/19.   - Discontinue heparin drip and associated labs - Initiate Eliquis 5mg  PO BID  Rexford Maus, PharmD, BCPS 05/17/2023 12:46 PM

## 2023-05-17 NOTE — Progress Notes (Signed)
     [address]       [place] [address]             [phone]        CTA with resolution of thrombus No need for surgery Will sign off

## 2023-05-17 NOTE — Telephone Encounter (Signed)
Patient Product/process development scientist completed.    The patient is insured through [employer]. Patient has Medicare and is not eligible for a copay card, but may be able to apply for patient assistance or Medicare RX Payment Plan (Patient Must reach out to their plan, if eligible for payment plan), if available.    Ran test claim for Eliquis 5 mg and the current 30 day co-pay is $0.00.   This test claim was processed through [facility]- copay amounts may vary at other pharmacies due to pharmacy/plan contracts, or as the patient moves through the different stages of their insurance plan.     Roland Earl, CPHT Pharmacy Technician III Certified Patient Advocate [facility] Patient Advocate Team Direct Number: [phone]  Fax: [phone]

## 2023-05-17 NOTE — Progress Notes (Signed)
  Progress Note   Patient: Kathryn [name] YQM:[id number] DOB: 1971/01/20 DOA: 05/06/2023     10 DOS: the patient was seen and examined on 05/17/2023   Brief hospital course: 53 year old with history of COPD, hypertension, substance abuse, psychiatric disorder presented with being excessively lethargic at home. Patient was sleeping at home all day so her daughter called EMS. In the ER, MRI brain consistent with multiple embolic infarcts. CTA without large vessel occlusion however it showed large thrombus at the aortic arch. CT angiogram of the aorta with above, no dissection. Admitted with neurology and CT surgery consultation.     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 Clinical findings, lethargic in the context of stroke, influenza A MRI of the brain, multifocal bilateral ischemia.  No pressure effect. CTA of the head and neck with no large vessel occlusion.  Large intraluminal filling defect within the aortic arch. Initial CT angiogram aorta, chest abdomen pelvis with no aortic dissection but large thrombus load on ascending aorta 2D echocardiogram, normal EF.  No evidence of intracardiac thrombus Antiplatelet therapy, none at home S/p IV heparin--> PO eliquis  LDL 91, Hemoglobin A1c 5.7 Lifelong anticoagulation per cardiology    Acute metabolic encephalopathy Resolved Likely multifactorial.  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  Primary cause unknown.  Active cocaine use Repeat CTA with resolved AA thrombus CT surgery consulted, now with resolved thrombus, no further recs, signed off She will need lifelong anticoagulation, started on PO Eliquis on 1/30  Tobacco and cocaine use Advised to quit Nicotine patch provided   Alcohol abuse  Drinks alcohol 1/5 of tequila daily along with a beer CIWA protocol.  Multivitamins   Influenza  A Positive for flu a PCR.  Completed Tamiflu for 5 days   Bipolar disorder Patient on Celexa, Lyrica, Seroquel at home   Essential hypertension Blood pressure stable.  Currently not on any blood pressure medications.      Subjective: Patient states that she is homeless.  Denies any new complaints.  TOC assisting patient for possible inpatient rehab program for substance abuse     Physical Exam: Vitals:   05/17/23 0345 05/17/23 0808 05/17/23 1147 05/17/23 1544  BP: 104/74 139/79 137/76 121/71  Pulse: 85 88 85 98  Resp: 18 18 18 17   Temp: 98.1 F (36.7 C) 98.4 F (36.9 C) 98.3 F (36.8 C) 98.6 F (37 C)  TempSrc: Oral Oral Oral Oral  SpO2: 97% 99% 98% 97%  Weight:      Height:       General: NAD  Cardiovascular: S1, S2 present Respiratory: CTAB Abdomen: Soft, nontender, nondistended, bowel sounds present Musculoskeletal: No bilateral pedal edema noted Skin: Normal Psychiatry: Normal mood   Family Communication: Pt in room, family at bedside  Disposition: Status is: Inpatient Remains inpatient appropriate because: severity of illness  Planned Discharge Destination: Home    Author: Briant Cedar, MD 05/17/2023 4:00 PM  For on call review [email or web address].

## 2023-05-17 NOTE — Progress Notes (Signed)
Speech Language Pathology Treatment: Cognitive-Linquistic  Patient Details Name: Kathryn [name] MRN: [id number] DOB: 09/08/1970 Today's Date: 05/17/2023 Time: 4098-1191 SLP Time Calculation (min) (ACUTE ONLY): 16 min  Assessment / Plan / Recommendation Clinical Impression  Pt making excellent progress and has met her cognitive goals. Treatment focused on memory and problem solving. She was able to state her diagnosis (could not last session re: stroke) and that clot in heart has resolved and no longer needs surgery. Last session pt given pencil/paper and observed that she has been using to write down information. She was accurate in her recall of activities in PT session. Kathryn [name] independently recalled the number to dietary services and observed ordering her lunch without difficulties. She needed 2 cues when reading directions and answering questions on written activity. Her cognition is functional in the acute setting and ST will sign off. Pt is unhoused at this time and feel she should be able to manage ADL's but would be beneficial if she had supervision initially while performing medication management or any financial responsibilities she may have.    HPI HPI: Kathryn [name] is a 53 y.o. female with medical history significant of COPD, HTN, substance abuse. Pt sleeping all day today so family brought pt to ED. MRI multifocal bilateral acute ischemia, greatest in the left parietal and occipital lobe, but also present in the posterior right  hemisphere      SLP Plan  All goals met;Discharge SLP treatment due to (comment)      Recommendations for follow up therapy are one component of a multi-disciplinary discharge planning process, led by the attending physician.  Recommendations may be updated based on patient status, additional functional criteria and insurance authorization.    Recommendations                     Oral care BID   Intermittent Supervision/Assistance Cognitive  communication deficit (R41.841)     All goals met;Discharge SLP treatment due to (comment)     Royce Macadamia  05/17/2023, 12:41 PM

## 2023-05-17 NOTE — NC FL2 (Signed)
[place] MEDICAID FL2 LEVEL OF CARE FORM     IDENTIFICATION  Patient Name: Kathryn [name] Birthdate: Oct 02, 1970 Sex: female Admission Date (Current Location): 05/06/2023  [facility] and [place] Number:  [employer] and Address:  The [place]. [facility], [address] [place], [place] [address]      Provider Number: [id number]  Attending Physician Name and Address:  Briant Cedar, MD  Relative Name and Phone Number:       Current Level of Care: Hospital Recommended Level of Care: Skilled Nursing Facility Prior Approval Number:    Date Approved/Denied:   PASRR Number: Manual review  Discharge Plan: SNF    Current Diagnoses: Patient Active Problem List   Diagnosis Date Noted   Acute embolic stroke (HCC) 05/07/2023   Aortic thrombus (HCC) 05/07/2023   Acute encephalopathy 05/07/2023   Cocaine abuse (HCC) 05/07/2023   Influenza A 05/07/2023    Orientation RESPIRATION BLADDER Height & Weight     Self, Time, Situation, Place  Normal Continent Weight: 174 lb 1.6 oz (79 kg) Height:  5\' 2"  (157.5 cm)  BEHAVIORAL SYMPTOMS/MOOD NEUROLOGICAL BOWEL NUTRITION STATUS      Continent Diet (regular)  AMBULATORY STATUS COMMUNICATION OF NEEDS Skin   Limited Assist Verbally Normal                       Personal Care Assistance Level of Assistance  Bathing, Feeding, Dressing Bathing Assistance: Limited assistance Feeding assistance: Independent Dressing Assistance: Limited assistance     Functional Limitations Info             SPECIAL CARE FACTORS FREQUENCY  PT (By licensed PT), OT (By licensed OT)     PT Frequency: 5x/wk OT Frequency: 5x/wk            Contractures Contractures Info: Not present    Additional Factors Info  Code Status, Allergies, Psychotropic Code Status Info: Full Allergies Info: Motrin (Ibuprofen), Flexeril (Cyclobenzaprine) Psychotropic Info: Celexa 20mg  daily, Seroquel 300mg  daily at bed          Current Medications (05/17/2023):  This is the current hospital active medication list Current Facility-Administered Medications  Medication Dose Route Frequency Provider Last Rate Last Admin   acetaminophen (TYLENOL) tablet 1,000 mg  1,000 mg Oral Q6H PRN Howerter, Justin B, DO   1,000 mg at 05/16/23 0607   Or   acetaminophen (TYLENOL) 160 MG/5ML solution 650 mg  650 mg Per Tube Q4H PRN Howerter, Justin B, DO       Or   acetaminophen (TYLENOL) suppository 650 mg  650 mg Rectal Q4H PRN Howerter, Justin B, DO       albuterol (PROVENTIL) (2.5 MG/3ML) 0.083% nebulizer solution 2.5 mg  2.5 mg Nebulization Q6H PRN Pham, Minh Q, RPH-CPP   2.5 mg at 05/15/23 0925   apixaban (ELIQUIS) tablet 5 mg  5 mg Oral BID Briant Cedar, MD   5 mg at 05/17/23 1303   atorvastatin (LIPITOR) tablet 40 mg  40 mg Oral Daily Marvel Plan, MD   40 mg at 05/17/23 1100   citalopram (CELEXA) tablet 20 mg  20 mg Oral Daily Dorcas Carrow, MD   20 mg at 05/17/23 1100   cyanocobalamin (VITAMIN B12) injection 1,000 mcg  1,000 mcg Intramuscular Q Lydia Guiles, Terrilee Files, MD   1,000 mcg at 05/14/23 6578   docusate sodium (COLACE) capsule 100 mg  100 mg Oral BID Howerter, Justin B, DO   100 mg at  05/17/23 1059   fluticasone furoate-vilanterol (BREO ELLIPTA) 200-25 MCG/ACT 1 puff  1 puff Inhalation Daily Dorcas Carrow, MD   1 puff at 05/16/23 0906   folic acid (FOLVITE) tablet 1 mg  1 mg Oral Daily Marvel Plan, MD   1 mg at 05/17/23 1100   metoprolol tartrate (LOPRESSOR) tablet 100 mg  100 mg Oral Once PRN Jonita Albee, PA-C       multivitamin with minerals tablet 1 tablet  1 tablet Oral Daily Marvel Plan, MD   1 tablet at 05/17/23 1100   Oral care mouth rinse  15 mL Mouth Rinse PRN Jerald Kief, MD       oxyCODONE (Oxy IR/ROXICODONE) immediate release tablet 5 mg  5 mg Oral Q4H PRN Howerter, Justin B, DO   5 mg at 05/17/23 1059   pantoprazole (PROTONIX) EC tablet 40 mg  40 mg Oral Daily Dorcas Carrow, MD   40 mg  at 05/17/23 1100   polyethylene glycol (MIRALAX / GLYCOLAX) packet 17 g  17 g Oral BID Jerald Kief, MD   17 g at 05/17/23 1059   pregabalin (LYRICA) capsule 100 mg  100 mg Oral BID Dorcas Carrow, MD   100 mg at 05/17/23 1100   QUEtiapine (SEROQUEL) tablet 300 mg  300 mg Oral QHS Dorcas Carrow, MD   300 mg at 05/16/23 2119   SUMAtriptan (IMITREX) tablet 25 mg  25 mg Oral Q2H PRN Jerald Kief, MD   25 mg at 05/16/23 0051   thiamine (VITAMIN B1) tablet 250 mg  250 mg Oral Daily Jerald Kief, MD   250 mg at 05/17/23 1059     Discharge Medications: Please see discharge summary for a list of discharge medications.  Relevant Imaging Results:  Relevant Lab Results:   Additional Information SS#: [id number]  Baldemar Lenis, LCSW

## 2023-05-17 NOTE — Discharge Instructions (Signed)

## 2023-05-17 NOTE — TOC Progression Note (Signed)
Transition of Care [facility]) - Progression Note    Patient Details  Name: Kathryn [name] MRN: [id number] Date of Birth: 06-Apr-1971  Transition of [facility]) CM/SW Contact  Kermit Balo, RN Phone Number: 05/17/2023, 3:40 PM  Clinical Narrative:     Pt is asking to attend SNF rehab as she says now she can not return to [place]'s home. CM has updated her that she will not get many if any offers for SNF due to her polysubstance use/ homelessness. Pt voiced understanding. She is open to going to rehab outside of [facility] LCSW updated.  TOC following.  Expected Discharge Plan: Home w Home Health Services Barriers to Discharge: Continued Medical Work up  Expected Discharge Plan and Services   Discharge Planning Services: CM Consult Post Acute Care Choice: IP Rehab Living arrangements for the past 2 months: Single Family Home                                       Social Determinants of Health (SDOH) Interventions SDOH Screenings   Food Insecurity: No Food Insecurity (05/09/2023)  Housing: High Risk (05/09/2023)  Transportation Needs: No Transportation Needs (05/09/2023)  Utilities: Not At Risk (05/09/2023)    Readmission Risk Interventions     No data to display

## 2023-05-18 ENCOUNTER — Inpatient Hospital Stay (HOSPITAL_COMMUNITY): Payer: Medicare HMO

## 2023-05-18 ENCOUNTER — Telehealth: Payer: Self-pay | Admitting: General Practice

## 2023-05-18 ENCOUNTER — Other Ambulatory Visit (HOSPITAL_COMMUNITY): Payer: Self-pay

## 2023-05-18 DIAGNOSIS — I639 Cerebral infarction, unspecified: Secondary | ICD-10-CM | POA: Diagnosis not present

## 2023-05-18 DIAGNOSIS — M7989 Other specified soft tissue disorders: Secondary | ICD-10-CM | POA: Diagnosis not present

## 2023-05-18 SURGERY — DEBRANCHING, AORTIC ARCH
Anesthesia: General

## 2023-05-18 MED ORDER — APIXABAN 5 MG PO TABS
5.0000 mg | ORAL_TABLET | Freq: Two times a day (BID) | ORAL | 0 refills | Status: DC
  Filled 2023-05-18: qty 60, 30d supply, fill #0

## 2023-05-18 MED ORDER — THIAMINE HCL 100 MG PO TABS
250.0000 mg | ORAL_TABLET | Freq: Every day | ORAL | 0 refills | Status: DC
  Filled 2023-05-18: qty 30, 12d supply, fill #0

## 2023-05-18 MED ORDER — VITAMIN B-12 1000 MCG PO TABS
1000.0000 ug | ORAL_TABLET | Freq: Every day | ORAL | 0 refills | Status: DC
  Filled 2023-05-18: qty 30, 30d supply, fill #0

## 2023-05-18 MED ORDER — FOLIC ACID 1 MG PO TABS
1.0000 mg | ORAL_TABLET | Freq: Every day | ORAL | 0 refills | Status: DC
  Filled 2023-05-18: qty 30, 30d supply, fill #0

## 2023-05-18 MED ORDER — OXYCODONE HCL 5 MG PO TABS
5.0000 mg | ORAL_TABLET | Freq: Four times a day (QID) | ORAL | 0 refills | Status: DC | PRN
  Filled 2023-05-18: qty 12, 3d supply, fill #0

## 2023-05-18 MED ORDER — ADULT MULTIVITAMIN W/MINERALS CH
1.0000 | ORAL_TABLET | Freq: Every day | ORAL | 0 refills | Status: DC
  Filled 2023-05-18: qty 30, 30d supply, fill #0

## 2023-05-18 MED ORDER — ATORVASTATIN CALCIUM 40 MG PO TABS
40.0000 mg | ORAL_TABLET | Freq: Every day | ORAL | 0 refills | Status: DC
  Filled 2023-05-18: qty 30, 30d supply, fill #0

## 2023-05-18 MED ORDER — POLYETHYLENE GLYCOL 3350 17 GM/SCOOP PO POWD
17.0000 g | Freq: Every day | ORAL | 0 refills | Status: DC
  Filled 2023-05-18: qty 238, 14d supply, fill #0

## 2023-05-18 MED ORDER — DOCUSATE SODIUM 100 MG PO CAPS
100.0000 mg | ORAL_CAPSULE | Freq: Two times a day (BID) | ORAL | 0 refills | Status: DC
  Filled 2023-05-18: qty 10, 5d supply, fill #0

## 2023-05-18 NOTE — Plan of Care (Signed)
Ambulated in hallway. Comfortable, cooperative through this day. No Complaint noted.   Problem: Education: Goal: Knowledge of General Education information will improve Description: Including pain rating scale, medication(s)/side effects and non-pharmacologic comfort measures Outcome: Progressing   Problem: Coping: Goal: Level of anxiety will decrease Outcome: Progressing   Problem: Skin Integrity: Goal: Risk for impaired skin integrity will decrease Outcome: Progressing   Problem: Safety: Goal: Ability to remain free from injury will improve Outcome: Progressing

## 2023-05-18 NOTE — Final Progress Note (Signed)
Transported her to Discharge Lounge, brought with her her Rollator. Medicine pick up at [facility].

## 2023-05-18 NOTE — Progress Notes (Signed)
Reviewed AVS, patient expressed understanding of medications, MD follow up reviewed.  Removed IV, Site clean, dry and intact.  Need to picked up medications from [facility] pharm, Pt will be transported to Discharge lounge to wait for taxi.

## 2023-05-18 NOTE — Progress Notes (Signed)
Occupational Therapy Treatment Patient Details Name: Kathryn [name] MRN: [id number] DOB: 30-Aug-1970 Today's Date: 05/18/2023   History of present illness 53 y.o. female adm with lethargy on 1/19. Found to be +for influenza A, MRI of brain revealed multiple embolic infarcts with likley source to be a thrombus in aortic arch. 1/30 CTA with resolution of thrombus, no need for surgery. PMH: COPD, HTN, substance abuse.   OT comments  Patient continues to demonstrate decreased cognition and insight into her situation and deficits.  Patient is essentially supervision for all in room mobility and ADL completion with occasional safety cues.  Patient is stating she is planning on returning home to her granddaughter's place, and if this can be confirmed, SNF level rehab may not be needed.  Patient needs continued outpatient cognitive rehab and up to 24 hour supervision at home.  OT will continue efforts in the acute setting to address deficits.         If plan is discharge home, recommend the following:  Assist for transportation;Assistance with cooking/housework;Supervision due to cognitive status;A little help with walking and/or transfers;A little help with bathing/dressing/bathroom;Direct supervision/assist for financial management;Direct supervision/assist for medications management   Equipment Recommendations  None recommended by OT    Recommendations for Other Services      Precautions / Restrictions Precautions Precautions: Fall Restrictions Weight Bearing Restrictions Per Provider Order: No       Mobility Bed Mobility Overal bed mobility: Modified Independent                  Transfers Overall transfer level: Needs assistance Equipment used: None Transfers: Sit to/from Stand Sit to Stand: Supervision                 Balance Overall balance assessment: Needs assistance Sitting-balance support: Feet supported Sitting balance-Leahy Scale: Normal     Standing balance  support: No upper extremity supported Standing balance-Leahy Scale: Fair                             ADL either performed or assessed with clinical judgement   ADL       Grooming: Wash/dry hands;Wash/dry face;Sitting;Supervision/safety;Standing           Upper Body Dressing : Sitting;Supervision/safety   Lower Body Dressing: Supervision/safety;Sit to/from stand   Toilet Transfer: Regular Toilet;Ambulation;Supervision/safety                  Extremity/Trunk Assessment Upper Extremity Assessment Upper Extremity Assessment: Overall WFL for tasks assessed   Lower Extremity Assessment Lower Extremity Assessment: Defer to PT evaluation   Cervical / Trunk Assessment Cervical / Trunk Assessment: Normal    Vision Patient Visual Report: No change from baseline     Perception Perception Perception: Not tested   Praxis Praxis Praxis: Not tested    Cognition Arousal: Alert Behavior During Therapy: WFL for tasks assessed/performed Overall Cognitive Status: No family/caregiver present to determine baseline cognitive functioning Area of Impairment: Following commands, Safety/judgement, Awareness, Problem solving, Memory                 Orientation Level: Disoriented to, Time   Memory: Decreased short-term memory Following Commands: Follows one step commands consistently Safety/Judgement: Decreased awareness of deficits   Problem Solving: Slow processing, Requires verbal cues          Exercises      Shoulder Instructions       General Comments VSS on RA  Pertinent Vitals/ Pain       Pain Assessment Pain Assessment: Faces Faces Pain Scale: Hurts a little bit Pain Location: head Pain Descriptors / Indicators: Headache Pain Intervention(s): Monitored during session                                                          Frequency  Min 1X/week        Progress Toward Goals  OT Goals(current goals can  now be found in the care plan section)     Acute Rehab OT Goals OT Goal Formulation: With patient Time For Goal Achievement: 05/29/23 Potential to Achieve Goals: Fair  Plan      Co-evaluation                 AM-PAC OT "6 Clicks" Daily Activity     Outcome Measure   Help from another person eating meals?: None Help from another person taking care of personal grooming?: None Help from another person toileting, which includes using toliet, bedpan, or urinal?: A Little Help from another person bathing (including washing, rinsing, drying)?: A Little Help from another person to put on and taking off regular upper body clothing?: None Help from another person to put on and taking off regular lower body clothing?: A Little 6 Click Score: 21    End of Session    OT Visit Diagnosis: Unsteadiness on feet (R26.81);Other symptoms and signs involving cognitive function   Activity Tolerance Patient tolerated treatment well   Patient Left in bed;with call bell/phone within reach;with bed alarm set   Nurse Communication Mobility status        Time: 2841-3244 OT Time Calculation (min): 17 min  Charges: OT General Charges $OT Visit: 1 Visit OT Treatments $Self Care/Home Management : 8-22 mins  05/18/2023  RP, OTR/L  Acute Rehabilitation Services  Office:  [phone]   [name] 05/18/2023, 12:44 PM

## 2023-05-18 NOTE — Progress Notes (Signed)
  Progress Note   Patient: Kathryn [name] ZOX:[id number] DOB: [birth year] DOA: 05/06/2023     11 DOS: the patient was seen and examined on 05/18/2023   Brief hospital course: 53 year old with history of COPD, hypertension, substance abuse, psychiatric disorder presented with being excessively lethargic at home. Patient was sleeping at home all day so her daughter called EMS. In the ER, MRI brain consistent with multiple embolic infarcts. CTA without large vessel occlusion however it showed large thrombus at the aortic arch. CT angiogram of the aorta with above, no dissection. Admitted with neurology and CT surgery consultation.     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 Clinical findings, lethargic in the context of stroke, influenza A MRI of the brain, multifocal bilateral ischemia.  No pressure effect. CTA of the head and neck with no large vessel occlusion.  Large intraluminal filling defect within the aortic arch. Initial CT angiogram aorta, chest abdomen pelvis with no aortic dissection but large thrombus load on ascending aorta 2D echocardiogram, normal EF.  No evidence of intracardiac thrombus Antiplatelet therapy, none at home S/p IV heparin--> PO eliquis  LDL 91, Hemoglobin A1c 5.7 Lifelong anticoagulation per cardiology    Acute metabolic encephalopathy Resolved Likely multifactorial.  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  Primary cause unknown.  Active cocaine use Repeat CTA with resolved AA thrombus CT surgery consulted, now with resolved thrombus, no further recs, signed off She will need lifelong anticoagulation, started on PO Eliquis on 1/30  Tobacco and cocaine use Advised to quit Nicotine patch provided   Alcohol abuse  Drinks alcohol 1/5 of tequila daily along with a beer CIWA protocol.  Multivitamins   Influenza  A Positive for flu a PCR.  Completed Tamiflu for 5 days   Bipolar disorder Patient on Celexa, Lyrica, Seroquel at home   Essential hypertension Blood pressure stable.  Currently not on any blood pressure medications.  Right Knee and Right foot pain/swelling Hx of arthritis  Doppler BLE, Xray R knee pending  Homelessness TOC assisting patient for possible inpatient rehab program for substance abuse      Subjective: Reports worsening R knee swelling and pain, with some RLE swelling.    Physical Exam: Vitals:   05/17/23 2341 05/18/23 0434 05/18/23 0829 05/18/23 0902  BP: 105/80 (!) 144/90 (!) 110/59   Pulse: 87 80 86   Resp:   19   Temp: 98.2 F (36.8 C) 97.8 F (36.6 C) 98.2 F (36.8 C)   TempSrc: Oral Oral Oral   SpO2: 97% 97% 98% 98%  Weight:      Height:       General: NAD  Cardiovascular: S1, S2 present Respiratory: CTAB Abdomen: Soft, nontender, nondistended, bowel sounds present Musculoskeletal: Trace RLE pedal edema noted, mild knee swelling Skin: Normal Psychiatry: Normal mood   Family Communication: Pt in room, family at bedside  Disposition: Status is: Inpatient Remains inpatient appropriate because: severity of illness  Planned Discharge Destination: Inpatient rehab program for substance abuse    Author: Briant Cedar, MD 05/18/2023 10:06 AM  For on call review [email or web address].

## 2023-05-18 NOTE — Telephone Encounter (Signed)
MRN: [id number] - Patient dismissed from practice in Feb of 2024. Office attempted to contact the patient, however, the number on file is not valid . E2C2 advised to follow up with patient and appropriately route message to current PCP.

## 2023-05-18 NOTE — Telephone Encounter (Signed)
Scheduled patient for TOC realized not our patient.   Copied from CRM [id number]. Topic: General - Other >> May 18, 2023 11:25 AM Kathryn [name] wrote: Reason for CRM: Patient needs a hospital follow up scheduled//She will be discharged from [facility] today for Stroke//Per epic send message for an appointment//Please call patient to schedule >> May 18, 2023  1:25 PM Kathryn [name] wrote: Called patient, call not go through will continue to try. Did get her scheduled 02.14.2025 with Dr Durwin Nora TOC at 1pm

## 2023-05-18 NOTE — Progress Notes (Signed)
Bilateral lower extremity venous duplex has been completed. Preliminary results can be found in CV Proc through chart review.   05/18/23 11:35 AM Olen Cordial RVT

## 2023-05-18 NOTE — Progress Notes (Signed)
Physical Therapy Treatment Patient Details Name: Kathryn [name] MRN: [id number] DOB: 1971/01/02 Today's Date: 05/18/2023   History of Present Illness 53 y.o. female adm with lethargy on 1/19. Found to be +for influenza A, MRI of brain revealed multiple embolic infarcts with likley source to be a thrombus in aortic arch. 1/30 CTA with resolution of thrombus, no need for surgery. PMH: COPD, HTN, substance abuse.    PT Comments  Patient resting in bed and talking on phone with family, agreeable and eager to mobilize with therapy. Pt completed bed mobility for supine<>sit with supervision only and cues for safety as pt slightly impulsive/quick to move. Pt performed sit<>stand with supervision, cues needed for hand placement/technique with Rollator. Pt tolerated greater distance of ~45' for ambulation with CGA fading to min assist as pt fatigued and Rt knee flexing in stance due to pain and weakness. PT utilized rollator seat for rest break and after extended break knee pian improved and gait more normalized with CGA for gait. Pt continues to require cues for safety throughout due to decreased awareness of deficits and slight impulsivity. Patient will benefit from continued inpatient follow up therapy, <3 hours/day. Will continue to progress as able.    If plan is discharge home, recommend the following: A little help with walking and/or transfers;Assistance with cooking/housework;Direct supervision/assist for medications management;Direct supervision/assist for financial management;Assist for transportation;Supervision due to cognitive status;Help with stairs or ramp for entrance   Can travel by private vehicle     Yes  Equipment Recommendations  Rollator (4 wheels)    Recommendations for Other Services       Precautions / Restrictions Precautions Precautions: Fall Restrictions Weight Bearing Restrictions Per Provider Order: No     Mobility  Bed Mobility Overal bed mobility: Needs  Assistance Bed Mobility: Supine to Sit, Sit to Supine     Supine to sit: Supervision Sit to supine: Supervision   General bed mobility comments: sup for safety, extra time    Transfers Overall transfer level: Needs assistance Equipment used: None, Rollator (4 wheels) Transfers: Sit to/from Stand, Bed to chair/wheelchair/BSC Sit to Stand: Supervision           General transfer comment: cues for technique with rollator, sup for safety    Ambulation/Gait Ambulation/Gait assistance: Contact guard assist Gait Distance (Feet): 45 Feet (2x45) Assistive device: Rollator (4 wheels), None Gait Pattern/deviations: Step-through pattern, Decreased stride length, Shuffle, Wide base of support Gait velocity: decr     General Gait Details: cues for safe management of rollator and CGA fading to min assist to steady with Rt knee pain and fatigu causing Rt knee flexing in stance. Seated rest on rollator and pt able to amb back to room. BP stable mid gait despite momentary c/o dizziness.   Stairs             Wheelchair Mobility     Tilt Bed    Modified Rankin (Stroke Patients Only) Modified Rankin (Stroke Patients Only) Pre-Morbid Rankin Score: No symptoms Modified Rankin: Moderately severe disability     Balance Overall balance assessment: Needs assistance Sitting-balance support: Feet supported Sitting balance-Leahy Scale: Good     Standing balance support: No upper extremity supported Standing balance-Leahy Scale: Poor                              Cognition Arousal: Alert Behavior During Therapy: WFL for tasks assessed/performed Overall Cognitive Status: No family/caregiver present to determine baseline cognitive  functioning Area of Impairment: Following commands, Safety/judgement, Awareness, Problem solving, Memory                     Memory: Decreased short-term memory Following Commands: Follows one step commands with increased time,  Follows one step commands consistently Safety/Judgement: Decreased awareness of deficits Awareness: Intellectual Problem Solving: Slow processing, Requires verbal cues General Comments: pt tangential but agreeable and appreciative of services. Pt with decreased insight to over all health and deficits.        Exercises General Exercises - Lower Extremity Ankle Circles/Pumps: AROM, Both, 10 reps    General Comments General comments (skin integrity, edema, etc.): VSS on RA      Pertinent Vitals/Pain Pain Assessment Pain Assessment: Faces Faces Pain Scale: Hurts little more Pain Location: R knee, L foot with mobility, improves with rest Pain Descriptors / Indicators: Grimacing, Guarding, Sore Pain Intervention(s): Limited activity within patient's tolerance, Monitored during session, Repositioned    Home Living                          Prior Function            PT Goals (current goals can now be found in the care plan section) Acute Rehab PT Goals PT Goal Formulation: With patient Time For Goal Achievement: 05/24/23 Potential to Achieve Goals: Good Progress towards PT goals: Progressing toward goals    Frequency    Min 3X/week      PT Plan      Co-evaluation              AM-PAC PT "6 Clicks" Mobility   Outcome Measure  Help needed turning from your back to your side while in a flat bed without using bedrails?: None Help needed moving from lying on your back to sitting on the side of a flat bed without using bedrails?: A Little Help needed moving to and from a bed to a chair (including a wheelchair)?: A Little Help needed standing up from a chair using your arms (e.g., wheelchair or bedside chair)?: A Little Help needed to walk in hospital room?: A Little Help needed climbing 3-5 steps with a railing? : A Lot 6 Click Score: 18    End of Session Equipment Utilized During Treatment: Gait belt Activity Tolerance: Patient tolerated treatment  well Patient left: with call bell/phone within reach;in bed Nurse Communication: Mobility status;Patient requests pain meds PT Visit Diagnosis: Unsteadiness on feet (R26.81);Other abnormalities of gait and mobility (R26.89);Dizziness and giddiness (R42)     Time: 1610-9604 PT Time Calculation (min) (ACUTE ONLY): 24 min  Charges:    $Gait Training: 8-22 mins $Therapeutic Activity: 8-22 mins PT General Charges $$ ACUTE PT VISIT: 1 Visit                     Wynn Maudlin, DPT Acute Rehabilitation Services Office [phone]  05/18/23 10:41 AM

## 2023-05-18 NOTE — Discharge Summary (Signed)
Physician Discharge Summary   Patient: Kathryn [name] MRN: [id number] DOB: 05-Jun-1970  Admit date:     05/06/2023  Discharge date: 05/18/23  Discharge Physician: Briant Cedar   PCP: Patient, No Pcp Per   Recommendations at discharge:   Follow-up with PCP as scheduled Follow-up with cardiology as scheduled Follow-up with neurology  Discharge Diagnoses: Principal Problem:   Acute embolic stroke [facility]) Active Problems:   Aortic thrombus (HCC)   Acute encephalopathy   Cocaine abuse (HCC)   Influenza A   Hospital Course: 53 year old with history of COPD, hypertension, substance abuse, psychiatric disorder presented with being excessively lethargic at home. Patient was sleeping at home all day so her daughter called EMS. In the ER, MRI brain consistent with multiple embolic infarcts. CTA without large vessel occlusion however it showed large thrombus at the aortic arch. CT angiogram of the aorta with above, no dissection. Admitted with neurology and CT surgery consultation.  Patient has been further stabilized.  Repeat CTA with resolved thrombus.   Patient currently homeless, but will be staying with her granddaughter for now.  TOC verified with granddaughter.  Patient to follow-up with PCP, cardiology, neurology.      Assessment and Plan:  Acute multiple territory embolic stroke secondary to aortic arch thrombus, active cocaine use. MRI of the brain, multifocal bilateral ischemia CTA of the head and neck with no large vessel occlusion.  Large intraluminal filling defect within the aortic arch. Initial CT angiogram aorta, chest abdomen pelvis with no aortic dissection but large thrombus load on ascending aorta 2D echocardiogram, normal EF.  No evidence of intracardiac thrombus S/p IV heparin--> discharge on PO eliquis  LDL 91, Hemoglobin A1c 5.7 Lifelong anticoagulation per cardiology  Continue lipitor   Acute metabolic encephalopathy Resolved Likely multifactorial.   Stroke, influenza, drugs   Aortic arch thrombus Resolved Patient had significant clot burden at her aortic arch.  Primary cause unknown.  Active cocaine use Repeat CTA with resolved AA thrombus CT surgery consulted, now with resolved thrombus, no further recs, signed off She will need lifelong anticoagulation, started on PO Eliquis on 1/30   Polysubstance abuse: Tobacco, alcohol and cocaine use Drinks alcohol 1/5 of tequila daily along with a beer Advised to quit Thiamine, folate MVT Advised to seek drug rehab, AA meetings  Vitamin B12 deficiency Daily oral supplement   Influenza A Positive for flu a PCR.  Completed Tamiflu for 5 days   Bipolar disorder Patient on Celexa, Lyrica, Seroquel at home   Essential hypertension Blood pressure stable Continue home regimen, follow-up with PCP to adjust BP meds as able   Right Knee and Right foot pain/swelling Hx of arthritis  Doppler BLE neg for DVT Xray R knee, moderate to severe medial compartmental moderate patellofemoral compartment osteoarthritis Discussed with patient to call [facility] to follow-up for further management as they replaced patient's left knee   Homelessness Patient will be staying with granddaughter, TOC verified with granddaughter       Pain control - [place] Controlled Substance Reporting System database was reviewed. and patient was instructed, not to drive, operate heavy machinery, perform activities at heights, swimming or participation in water activities or provide baby-sitting services while on Pain, Sleep and Anxiety Medications; until their outpatient Physician has advised to do so again. Also recommended to not to take more than prescribed Pain, Sleep and Anxiety Medications.    Consultants: Neurology, cardiothoracic surgery, cardiology Procedures performed: None Disposition: Relative's home Diet recommendation:  Cardiac diet  DISCHARGE MEDICATION: Allergies as of  05/18/2023       Reactions   Motrin [ibuprofen] Anaphylaxis   Flexeril [cyclobenzaprine] Hives        Medication List     TAKE these medications    albuterol 108 (90 Base) MCG/ACT inhaler Commonly known as: VENTOLIN HFA Inhale 1-2 puffs into the lungs every 6 (six) hours as needed for wheezing or shortness of breath.   apixaban 5 MG Tabs tablet Commonly known as: ELIQUIS Take 1 tablet (5 mg total) by mouth 2 (two) times daily.   atorvastatin 40 MG tablet Commonly known as: LIPITOR Take 1 tablet (40 mg total) by mouth daily. Start taking on: May 19, 2023   baclofen 20 MG tablet Commonly known as: LIORESAL Take 20 mg by mouth 3 (three) times daily as needed.   Breo Ellipta 200-25 MCG/ACT Aepb Generic drug: fluticasone furoate-vilanterol Inhale 2 puffs into the lungs 2 (two) times daily.   citalopram 20 MG tablet Commonly known as: CELEXA Take 20 mg by mouth daily.   cyanocobalamin 1000 MCG tablet Commonly known as: VITAMIN B12 Take 1 tablet (1,000 mcg total) by mouth daily.   docusate sodium 100 MG capsule Commonly known as: COLACE Take 1 capsule (100 mg total) by mouth 2 (two) times daily.   folic acid 1 MG tablet Commonly known as: FOLVITE Take 1 tablet (1 mg total) by mouth daily. Start taking on: May 19, 2023   hydrochlorothiazide 25 MG tablet Commonly known as: HYDRODIURIL Take 25 mg by mouth daily.   losartan 25 MG tablet Commonly known as: COZAAR Take 12.5 mg by mouth daily.   multivitamin with minerals Tabs tablet Take 1 tablet by mouth daily. Start taking on: May 19, 2023   omeprazole 20 MG capsule Commonly known as: PRILOSEC Take 20 mg by mouth daily.   oxyCODONE 5 MG immediate release tablet Commonly known as: Oxy IR/ROXICODONE Take 1 tablet (5 mg total) by mouth every 6 (six) hours as needed for up to 3 days for moderate pain (pain score 4-6).   polyethylene glycol 17 g packet Commonly known as: MIRALAX / GLYCOLAX Take 17  g by mouth daily.   pregabalin 200 MG capsule Commonly known as: LYRICA Take 200 mg by mouth in the morning and at bedtime.   QUEtiapine 300 MG tablet Commonly known as: SEROQUEL Take 300 mg by mouth at bedtime.   spironolactone 25 MG tablet Commonly known as: ALDACTONE Take 25 mg by mouth daily.   Thiamine Mononitrate 250 MG Tabs Take 250 mg by mouth daily. Start taking on: May 19, 2023               Durable Medical Equipment  (From admission, onward)           Start     Ordered   05/18/23 1115  For home use only DME 4 wheeled rolling walker with seat  Once       Question:  Patient needs a walker to treat with the following condition  Answer:  Weakness   05/18/23 1114            Follow-up Information     [facility] Medicare Transportation Follow up.   Why: Call to see about your benefits and to schedule rides. have to schedule 2-3 days in advance Contact information: [phone]        [facility]. Schedule an appointment as soon as possible for a visit in 1 month(s).   Specialty: Neurology  Why: stroke clinic Contact information: [address] [place] [address] [phone]        [facility] Follow up.   Specialty: Home Health Services Why: The home health agency will contact you for the first home visit. Contact information: [address] [place] [address] [phone]         [facility] Follow up.   Why: The practice will call you to schedule an appointment. If you have not received a call within a week please call the office. Contact information: [address] [place], [place] [address]  Phone: [phone]               Discharge Exam: Filed Weights   05/06/23 2031  Weight: 79 kg   General: NAD  Cardiovascular: S1, S2 present Respiratory: CTAB Abdomen: Soft, nontender, nondistended, bowel sounds  present Musculoskeletal: No bilateral pedal edema noted Skin: Normal Psychiatry: Normal mood   Condition at discharge: stable  The results of significant diagnostics from this hospitalization (including imaging, microbiology, ancillary and laboratory) are listed below for reference.   Imaging Studies: VAS [name] LOWER EXTREMITY VENOUS (DVT) Result Date: 05/18/2023  Lower Venous DVT Study Patient Name:  FREDRICA CAPANO  Date of Exam:   05/18/2023 Medical Rec #: [id number]      Accession #:    [id number] Date of Birth: 1971/01/27       Patient Gender: F Patient Age:   53 years Exam Location:  [facility] Procedure:      VAS [name] LOWER EXTREMITY VENOUS (DVT) Referring Phys: [facility] Birdie Beveridge --------------------------------------------------------------------------------  Indications: Swelling.  Risk Factors: None identified. Limitations: Body habitus and poor ultrasound/tissue interface. Comparison Study: No prior studies. Performing Technologist: Chanda Busing RVT  Examination Guidelines: A complete evaluation includes B-mode imaging, spectral Doppler, color Doppler, and power Doppler as needed of all accessible portions of each vessel. Bilateral testing is considered an integral part of a complete examination. Limited examinations for reoccurring indications may be performed as noted. The reflux portion of the exam is performed with the patient in reverse Trendelenburg.  +---------+---------------+---------+-----------+----------+--------------+ RIGHT    CompressibilityPhasicitySpontaneityPropertiesThrombus Aging +---------+---------------+---------+-----------+----------+--------------+ CFV      Full           Yes      Yes                                 +---------+---------------+---------+-----------+----------+--------------+ SFJ      Full                                                        +---------+---------------+---------+-----------+----------+--------------+ FV Prox   Full                                                        +---------+---------------+---------+-----------+----------+--------------+ FV Mid                  Yes      Yes                                 +---------+---------------+---------+-----------+----------+--------------+  FV DistalFull                                                        +---------+---------------+---------+-----------+----------+--------------+ PFV      Full                                                        +---------+---------------+---------+-----------+----------+--------------+ POP      Full           Yes      Yes                                 +---------+---------------+---------+-----------+----------+--------------+ PTV      Full                                                        +---------+---------------+---------+-----------+----------+--------------+ PERO     Full                                                        +---------+---------------+---------+-----------+----------+--------------+   +---------+---------------+---------+-----------+----------+--------------+ LEFT     CompressibilityPhasicitySpontaneityPropertiesThrombus Aging +---------+---------------+---------+-----------+----------+--------------+ CFV      Full           Yes      Yes                                 +---------+---------------+---------+-----------+----------+--------------+ SFJ      Full                                                        +---------+---------------+---------+-----------+----------+--------------+ FV Prox  Full                                                        +---------+---------------+---------+-----------+----------+--------------+ FV Mid   Full                                                        +---------+---------------+---------+-----------+----------+--------------+ FV Distal               Yes      Yes                                  +---------+---------------+---------+-----------+----------+--------------+  PFV      Full                                                        +---------+---------------+---------+-----------+----------+--------------+ POP      Full           Yes      Yes                                 +---------+---------------+---------+-----------+----------+--------------+ PTV      Full                                                        +---------+---------------+---------+-----------+----------+--------------+ PERO     Full                                                        +---------+---------------+---------+-----------+----------+--------------+    Summary: RIGHT: - There is no evidence of deep vein thrombosis in the lower extremity. However, portions of this examination were limited- see technologist comments above.  - No cystic structure found in the popliteal fossa.  LEFT: - There is no evidence of deep vein thrombosis in the lower extremity. However, portions of this examination were limited- see technologist comments above.  - No cystic structure found in the popliteal fossa.  *See table(s) above for measurements and observations.    Preliminary    DG Knee 1-2 Views Right Result Date: 05/18/2023 CLINICAL DATA:  Pain and swelling of right knee. EXAM: RIGHT KNEE - 1-2 VIEW COMPARISON:  Right knee radiographs 02/23/2023 FINDINGS: There is again diffuse decreased bone mineralization. Moderate 2 severe medial coronoid joint space narrowing and peripheral osteophytosis, unchanged. Mild peripheral lateral quadrant degenerative osteophytosis without joint space narrowing. Moderate patellofemoral joint space narrowing and inferior patellar degenerative osteophytosis. Tiny joint effusion, improved from prior. There is curvilinear calcification measuring up to 12 mm medial to the superior aspect of the femoral condyle, unchanged from prior and likely the sequela of remote  trauma to the proximal medial collateral ligament. Unchanged small oval sclerotic focus within the proximal fibular diaphysis, compatible with a benign bone island. No acute fracture or dislocation. IMPRESSION: 1. Moderate to severe medial compartment and moderate patellofemoral compartment osteoarthritis, unchanged. 2. Tiny joint effusion, improved from prior. Electronically Signed   By: Neita Garnet M.D.   On: 05/18/2023 11:29   CT ANGIO CHEST AORTA W/CM & OR WO/CM Result Date: 05/16/2023 CLINICAL DATA:  History of aortic thrombus EXAM: CT ANGIOGRAPHY CHEST WITH CONTRAST TECHNIQUE: Multidetector CT imaging of the chest was performed using the standard protocol during bolus administration of intravenous contrast. Multiplanar CT image reconstructions and MIPs were obtained to evaluate the vascular anatomy. RADIATION DOSE REDUCTION: This exam was performed according to the departmental dose-optimization program which includes automated exposure control, adjustment of the mA and/or kV according to patient size and/or use of iterative reconstruction technique. CONTRAST:  75mL OMNIPAQUE IOHEXOL 350 MG/ML SOLN  COMPARISON:  05/06/2023, 05/09/2023 FINDINGS: Cardiovascular: Initial precontrast images show no hyperdense crescent to suggest acute aortic injury. The thoracic aorta is well visualized. No aneurysmal dilatation or dissection is seen. The previously seen thrombus in the aortic arch at the level of the origin of the common trunk of the right innominate artery and left common carotid artery is no longer identified. Comparison with a cardiac CTA from 05/09/2023 showed some partial resolution of the thrombus at that time. No significant residual thrombus is seen. Visualized branch vessels appear within normal limits. No findings to suggest distal embolization are seen. Heart is mildly enlarged in size. The pulmonary artery shows a normal branching pattern. No intraluminal filling defect to suggest pulmonary  embolism is seen. Mediastinum/Nodes: Thoracic inlet is within normal limits. No hilar or mediastinal adenopathy is noted. The esophagus as visualized is within normal limits. Lungs/Pleura: The lungs are well aerated bilaterally. No focal infiltrate or sizable effusion is seen. No parenchymal nodules are noted. Upper Abdomen: Visualized upper abdomen shows no acute abnormality. Musculoskeletal: No chest wall abnormality. No acute or significant osseous findings. Review of the MIP images confirms the above findings. IMPRESSION: Complete resolution of previously seen aortic arch thrombus. Partial resolution was noted on a prior cardiac CT from 05/09/2023. No other focal abnormality is noted. Electronically Signed   By: Alcide Clever M.D.   On: 05/16/2023 19:05   CT CORONARY MORPH W/CTA COR W/SCORE W/CA W/CM &/OR WO/CM Addendum Date: 05/09/2023 ADDENDUM REPORT: 05/09/2023 18:49 EXAM: OVER-READ INTERPRETATION  CT CHEST The following report is an over-read performed by radiologist Dr. Joen Laura [facility] on 05/09/2023. This over-read does not include interpretation of cardiac or coronary anatomy or pathology. The coronary CTA interpretation by the cardiologist is attached. COMPARISON:  05/06/2023 FINDINGS: Mediastinum: No pathologic adenopathy. Small hiatal hernia. Visualized portions of the tracheobronchial tree and esophagus are unremarkable. The intraluminal thrombus within the aortic arch seen on prior study is only partially visualized on this exam. Lungs: Minimal tree in bud nodular airspace disease within the superior segment of the left lower lobe may be inflammatory or infectious. No effusion or pneumothorax. Upper abdomen: Unremarkable. Musculoskeletal: No acute or destructive bony abnormalities. IMPRESSION: 1. Minimal tree in bud nodular airspace disease within the superior segment of the left lower lobe, consistent with inflammatory or infectious etiology. 2. Partial visualization of the  known intraluminal thrombus within the aortic arch. Please see recent CT angiography exam for full description of findings. 3. Small hiatal hernia. Electronically Signed   By: Sharlet Salina M.D.   On: 05/09/2023 18:49   Result Date: 05/09/2023 CLINICAL DATA:  This is a 53 year old female with anginal symptoms EXAM: Cardiac/Coronary  CTA TECHNIQUE: The patient was scanned on a [employer]. FINDINGS: A 100 kV prospective scan was triggered in the descending thoracic aorta at 111 HU's. Axial non-contrast 3 mm slices were carried out through the heart. The data set was analyzed on a dedicated work station and scored using the Agatson method. Gantry rotation speed was 250 msecs and collimation was .6 mm. No beta blockade and 0.8 mg of sl NTG was given. The 3D data set was reconstructed in 5% intervals of the 67-82 % of the R-R cycle. Diastolic phases were analyzed on a dedicated work station using MPR, MIP and VRT modes. The patient received 80 cc of contrast. Image Quality: Fair with misregistration artifact. Aorta: Normal size.  No calcifications.  No dissection. Aortic Valve:  Trileaflet.  No calcifications.  Coronary Arteries:  Normal coronary origin.  Right dominance. RCA is a large dominant artery that gives rise to PDA and PLA. There is a mild (25-49%) focal calcification in the mid LAD. The proximal and distal LAD with no plaques. Left main is a large artery that gives rise to LAD and LCX arteries. LAD is a large vessel. There is a mixed focal mild plaque in the mid LAD. The proximal and distal LAD with no plaques. LCX is a non-dominant artery that gives rise to one large OM1 branch. There is a mix mild plaque in the proximal to mid LCX. The mid and distal LCX with no plaques. Coronary Calcium Score: Left main: 0 Left anterior descending artery: 35.3 Left circumflex artery: 34.7 Right coronary artery: 11.1 Total: 81.1 Percentile: 95 Other findings: Normal pulmonary vein drainage into the left atrium.  Normal left atrial appendage without a thrombus. Normal size of the pulmonary artery. IMPRESSION: 1. Coronary calcium score of 81.1. This was 95 percentile for age and sex matched control. 2. Normal coronary origin with right dominance. 3. CAD-RADS 2. Mild non-obstructive CAD (25-49%). Consider non-atherosclerotic causes of chest pain. Consider preventive therapy and risk factor modification. 4. Total plaque volume is 97 mm3 which is 72nd percentile for age- and sex-matched controls (calcified plaque 11 mm3; non-calcified plaque 86 mm3, Low attenuation 1 mm3). TPV is mild. The noncardiac portion of this study will be interpreted in separate report by the radiologist. Electronically Signed: By: Thomasene Ripple D.O. On: 05/09/2023 15:06   Overnight EEG with video Result Date: 05/08/2023 Charlsie Quest, MD     05/08/2023 11:27 AM Patient Name: Konnor Jorden MRN: [id number] Epilepsy Attending: Charlsie Quest Referring Physician/Provider: Erick Blinks, MD Duration: 05/07/2023 4098 to 05/08/2023 1191 Patient history: 53yo F with stroke getting eeg to evaluate for seizure Level of alertness: Awake, asleep AEDs during EEG study: None Technical aspects: This EEG study was done with scalp electrodes positioned according to the 10-20 International system of electrode placement. Electrical activity was reviewed with band pass filter of 1-70Hz , sensitivity of 7 uV/mm, display speed of 37mm/sec with a 60Hz  notched filter applied as appropriate. EEG data were recorded continuously and digitally stored.  Video monitoring was available and reviewed as appropriate. Description: The posterior dominant rhythm consists of 8 Hz activity of moderate voltage (25-35 uV) seen predominantly in posterior head regions, symmetric and reactive to eye opening and eye closing. Sleep was characterized by vertex waves, sleep spindles (12 to 14 Hz), maximal frontocentral region. EEG showed intermittent generalized and maximal left temporal  region 3 to 6 Hz theta-delta slowing.  Hyperventilation and photic stimulation were not performed.   ABNORMALITY - Intermittent slow, generalized and maximal left temporal IMPRESSION: This study is suggestive of cortical dysfunction in left temporal region likely secondary to underlying structural abnormality.  Additionally there is mild diffuse encephalopathy.  No seizures or epileptiform discharges were seen throughout the recording. Charlsie Quest   ECHOCARDIOGRAM COMPLETE Result Date: 05/07/2023    ECHOCARDIOGRAM REPORT   Patient Name:   MONZERRAT Postlewait Date of Exam: 05/07/2023 Medical Rec #:  [id number]     Height:       62.0 in Accession #:    [id number]    Weight:       174.1 lb Date of Birth:  [birth year]      BSA:          1.802 m Patient Age:    53 years      BP:  115/69 mmHg Patient Gender: F             HR:           77 bpm. Exam Location:  Inpatient Procedure: 2D Echo, 3D Echo, Cardiac Doppler and Color Doppler Indications:    Stroke  History:        Patient has no prior history of Echocardiogram examinations.  Sonographer:    Karma Ganja Referring Phys: [phone] JARED M GARDNER  Sonographer Comments: Technically challenging study due to limited acoustic windows and no subcostal window. Image acquisition challenging due to uncooperative patient. IMPRESSIONS  1. Left ventricular ejection fraction, by estimation, is 60 to 65%. The left ventricle has normal function. The left ventricle has no regional wall motion abnormalities. There is mild left ventricular hypertrophy. Left ventricular diastolic parameters were normal.  2. Right ventricular systolic function is normal. The right ventricular size is normal.  3. The mitral valve is normal in structure. Trivial mitral valve regurgitation. No evidence of mitral stenosis.  4. The aortic valve is tricuspid. There is mild thickening of the aortic valve. Aortic valve regurgitation is mild. No aortic stenosis is present. FINDINGS  Left Ventricle: Left  ventricular ejection fraction, by estimation, is 60 to 65%. The left ventricle has normal function. The left ventricle has no regional wall motion abnormalities. The left ventricular internal cavity size was normal in size. There is  mild left ventricular hypertrophy. Left ventricular diastolic parameters were normal. Right Ventricle: The right ventricular size is normal. No increase in right ventricular wall thickness. Right ventricular systolic function is normal. Left Atrium: Left atrial size was normal in size. Right Atrium: Right atrial size was normal in size. Pericardium: There is no evidence of pericardial effusion. Mitral Valve: The mitral valve is normal in structure. Trivial mitral valve regurgitation. No evidence of mitral valve stenosis. Tricuspid Valve: The tricuspid valve is normal in structure. Tricuspid valve regurgitation is trivial. No evidence of tricuspid stenosis. Aortic Valve: The aortic valve is tricuspid. There is mild thickening of the aortic valve. Aortic valve regurgitation is mild. Aortic regurgitation PHT measures 393 msec. No aortic stenosis is present. Aortic valve mean gradient measures 5.0 mmHg. Aortic  valve peak gradient measures 12.0 mmHg. Aortic valve area, by VTI measures 2.10 cm. Pulmonic Valve: The pulmonic valve was normal in structure. Pulmonic valve regurgitation is trivial. No evidence of pulmonic stenosis. Aorta: The aortic root is normal in size and structure. IAS/Shunts: The interatrial septum was not well visualized.  LEFT VENTRICLE PLAX 2D LVIDd:         4.60 cm   Diastology LVIDs:         2.70 cm   LV e' medial:    8.24 cm/s LV PW:         1.10 cm   LV E/e' medial:  10.3 LV IVS:        1.00 cm   LV e' lateral:   9.64 cm/s LVOT diam:     1.80 cm   LV E/e' lateral: 8.8 LV SV:         70 LV SV Index:   39 LVOT Area:     2.54 cm                           3D Volume EF:                          3D  EF:        54 %                          LV EDV:       114 ml                           LV ESV:       53 ml                          LV SV:        62 ml RIGHT VENTRICLE             IVC RV Basal diam:  3.40 cm     IVC diam: 1.70 cm RV S prime:     13.40 cm/s TAPSE (M-mode): 2.3 cm LEFT ATRIUM             Index        RIGHT ATRIUM           Index LA diam:        4.20 cm 2.33 cm/m   RA Area:     16.20 cm LA Vol (A2C):   66.0 ml 36.62 ml/m  RA Volume:   39.60 ml  21.97 ml/m LA Vol (A4C):   46.8 ml 25.97 ml/m LA Biplane Vol: 56.3 ml 31.24 ml/m  AORTIC VALVE AV Area (Vmax):    1.82 cm AV Area (Vmean):   2.00 cm AV Area (VTI):     2.10 cm AV Vmax:           173.00 cm/s AV Vmean:          103.000 cm/s AV VTI:            0.334 m AV Peak Grad:      12.0 mmHg AV Mean Grad:      5.0 mmHg LVOT Vmax:         124.00 cm/s LVOT Vmean:        80.800 cm/s LVOT VTI:          0.275 m LVOT/AV VTI ratio: 0.82 AI PHT:            393 msec  AORTA Ao Root diam: 2.90 cm MITRAL VALVE                TRICUSPID VALVE MV Area (PHT): 3.66 cm     TR Peak grad:   20.8 mmHg MV Decel Time: 207 msec     TR Vmax:        228.00 cm/s MV E velocity: 84.70 cm/s MV A velocity: 104.00 cm/s  SHUNTS MV E/A ratio:  0.81         Systemic VTI:  0.28 m                             Systemic Diam: 1.80 cm Weston Brass MD Electronically signed by Weston Brass MD Signature Date/Time: 05/07/2023/1:34:09 PM    Final    CT Angio Chest/Abd/Pel for Dissection W and/or Wo Contrast Result Date: 05/06/2023 CLINICAL DATA:  Acute aortic syndrome suspected based on findings on CTA head and neck. Altered mental status. EXAM: CT ANGIOGRAPHY CHEST, ABDOMEN AND PELVIS TECHNIQUE: Multidetector CT imaging through the chest, abdomen and pelvis was performed using the standard protocol during bolus administration of intravenous contrast. Multiplanar  reconstructed images and MIPs were obtained and reviewed to evaluate the vascular anatomy. RADIATION DOSE REDUCTION: This exam was performed according to the departmental dose-optimization program  which includes automated exposure control, adjustment of the mA and/or kV according to patient size and/or use of iterative reconstruction technique. CONTRAST:  80mL OMNIPAQUE IOHEXOL 350 MG/ML SOLN COMPARISON:  Same day CTA head and neck; chest radiograph 05/06/2023; CT chest abdomen pelvis without IV contrast 05/06/2023 FINDINGS: CTA CHEST FINDINGS Cardiovascular: Intraluminal filling defect in the aortic arch measuring 3.4 x 1.4 x 2.2 cm. Aortic root and ascending aorta are partially obscured by motion artifact. No definite dissection flap. No aneurysm. There are additional small filling defects in the descending aorta measuring 4 mm (5/44 and 5/76). The aortic arch branch vessels widely patent. Normal heart size. No pericardial effusion. No central pulmonary embolism. Mediastinum/Nodes: Bowing of the posterior trachea compatible with expiratory phase. Thyroid and esophagus are unremarkable. No thoracic adenopathy. Lungs/Pleura: Hypoventilation changes in the lungs. No focal consolidation, pleural effusion, or pneumothorax. Musculoskeletal: No acute fracture. Review of the MIP images confirms the above findings. CTA ABDOMEN AND PELVIS FINDINGS VASCULAR Aorta: Normal caliber abdominal aorta without dissection. Mild aortic atherosclerotic calcification. No intraluminal filling defects. Celiac: Patent without aneurysm or dissection. SMA: Patent without aneurysm or dissection. Renals: Patent without aneurysm or dissection. IMA: Patent. Inflow: Patent without aneurysm or dissection. Veins: No obvious venous abnormality within the limitations of this arterial phase study. Review of the MIP images confirms the above findings. NON-VASCULAR Hepatobiliary: No acute abnormality. Pancreas: Unremarkable. Spleen: Unremarkable. Adrenals/Urinary Tract: Normal adrenal glands. Wedge-shaped filling defect in the inferior pole of the right kidney (series 9/image 109) is favored to represent a chronic scar though infarct could appear  similarly. Stomach/Bowel: No bowel obstruction or bowel wall thickening. Stomach and appendix are within normal limits. Lymphatic: No lymphadenopathy. Reproductive: No acute abnormality. Other: No free intraperitoneal fluid or air. Musculoskeletal: No acute fracture.  Posterior fusion L3-L5. Review of the MIP images confirms the above findings. IMPRESSION: 1. 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 Additional small filling defects in the descending aorta measuring 4 mm. No definite dissection flap. No aneurysm. 2. Wedge-shaped filling defect in the inferior pole of the right kidney is favored to represent a chronic scar though infarct could appear similarly. Aortic Atherosclerosis (ICD10-I70.0). Electronically Signed   By: Minerva Fester M.D.   On: 05/06/2023 20:35   MR BRAIN WO CONTRAST Result Date: 05/06/2023 CLINICAL DATA:  Altered mental status EXAM: MRI HEAD WITHOUT CONTRAST TECHNIQUE: Multiplanar, multiecho pulse sequences of the brain and surrounding structures were obtained without intravenous contrast. COMPARISON:  CTA head neck 05/06/2023 FINDINGS: Brain: There is multifocal bilateral acute ischemia, greatest in the left parietal and occipital lobe, but also present in the posterior right hemisphere. No acute or chronic hemorrhage. There is multifocal hyperintense T2-weighted signal within the white matter. Generalized volume loss. The midline structures are normal. Vascular: Normal flow voids. Skull and upper cervical spine: Normal calvarium and skull base. Visualized upper cervical spine and soft tissues are normal. Sinuses/Orbits:Left mastoid effusion. Paranasal sinuses are clear. Normal orbits. IMPRESSION: Multifocal bilateral acute ischemia, greatest in the left parietal and occipital lobe, but also present in the posterior right hemisphere. No hemorrhage or mass effect. Electronically Signed   By: Deatra Robinson M.D.   On: 05/06/2023 20:25   CT ANGIO HEAD NECK  W WO CM Result Date: 05/06/2023 CLINICAL DATA:  Acute neurologic deficit EXAM: CT ANGIOGRAPHY  HEAD AND NECK WITH AND WITHOUT CONTRAST TECHNIQUE: Multidetector CT imaging of the head and neck was performed using the standard protocol during bolus administration of intravenous contrast. Multiplanar CT image reconstructions and MIPs were obtained to evaluate the vascular anatomy. Carotid stenosis measurements (when applicable) are obtained utilizing NASCET criteria, using the distal internal carotid diameter as the denominator. RADIATION DOSE REDUCTION: This exam was performed according to the departmental dose-optimization program which includes automated exposure control, adjustment of the mA and/or kV according to patient size and/or use of iterative reconstruction technique. CONTRAST:  75mL OMNIPAQUE IOHEXOL 350 MG/ML SOLN COMPARISON:  None Available. FINDINGS: CTA NECK FINDINGS Skeleton: No acute abnormality or high grade bony spinal canal stenosis. Other neck: Normal pharynx, larynx and major salivary glands. No cervical lymphadenopathy. Unremarkable thyroid gland. Upper chest: No pneumothorax or pleural effusion. No nodules or masses. Aortic arch: There is no calcific atherosclerosis of the aortic arch. There is a large intraluminal filling defect within the aortic arch, consistent with thromboembolism RIGHT carotid system: Normal without aneurysm, dissection or stenosis. LEFT carotid system: Normal without aneurysm, dissection or stenosis. Vertebral arteries: Codominant configuration. There is no dissection, occlusion or flow-limiting stenosis to the skull base (V1-V3 segments). CTA HEAD FINDINGS POSTERIOR CIRCULATION: Vertebral arteries are normal. No proximal occlusion of the anterior or inferior cerebellar arteries. Basilar artery is normal. Superior cerebellar arteries are normal. Posterior cerebral arteries are normal. ANTERIOR CIRCULATION: Intracranial internal carotid arteries are normal. Anterior  cerebral arteries are normal. Middle cerebral arteries are normal. Venous sinuses: As permitted by contrast timing, patent. Anatomic variants: Fetal origin of the right posterior cerebral artery. Review of the MIP images confirms the above findings. IMPRESSION: 1. Large intraluminal filling defect within the aortic arch, consistent with thromboembolism. An aortic CTA is recommended. 2. No emergent large vessel occlusion or high-grade stenosis of the intracranial arteries. Electronically Signed   By: Deatra Robinson M.D.   On: 05/06/2023 20:19   DG Chest Port 1 View Result Date: 05/06/2023 CLINICAL DATA:  Sepsis presentation.  Mental status changes. EXAM: PORTABLE CHEST 1 VIEW COMPARISON:  02/15/2023. FINDINGS: Allowing for the AP technique and poor inspiration, there is probably mild cardiomegaly. There may be mild pulmonary venous hypertension but there is no frank edema, infiltrate, collapse or effusion. IMPRESSION: Poor inspiration. Probable mild cardiomegaly and mild pulmonary venous hypertension. No frank edema, infiltrate or collapse. Electronically Signed   By: Paulina Fusi M.D.   On: 05/06/2023 17:34   CT CHEST ABDOMEN PELVIS WO CONTRAST Result Date: 05/06/2023 CLINICAL DATA:  Altered level of consciousness, sepsis EXAM: CT CHEST, ABDOMEN AND PELVIS WITHOUT CONTRAST TECHNIQUE: Multidetector CT imaging of the chest, abdomen and pelvis was performed following the standard protocol without IV contrast. Examination was performed without contrast at the request of the ordering clinician. Evaluation of the soft tissues, solid viscera, and vascular structures is limited. RADIATION DOSE REDUCTION: This exam was performed according to the departmental dose-optimization program which includes automated exposure control, adjustment of the mA and/or kV according to patient size and/or use of iterative reconstruction technique. COMPARISON:  07/26/2022 FINDINGS: CT CHEST FINDINGS Cardiovascular: Unenhanced imaging of  the heart demonstrates mild cardiomegaly without pericardial effusion. Normal caliber of the thoracic aorta. Evaluation of the vascular lumen is limited without IV contrast. Mediastinum/Nodes: No enlarged mediastinal, hilar, or axillary lymph nodes. Thyroid gland, trachea, and esophagus demonstrate no significant findings. Lungs/Pleura: No acute airspace disease, effusion, or pneumothorax. Central airways are patent. Musculoskeletal: No acute or destructive bony abnormalities. Reconstructed images  demonstrate no additional findings. CT ABDOMEN PELVIS FINDINGS Hepatobiliary: Unremarkable unenhanced appearance of the liver and gallbladder. Pancreas: Unremarkable unenhanced appearance. Spleen: Unremarkable unenhanced appearance. Adrenals/Urinary Tract: No urinary tract calculi or obstructive uropathy. The adrenals and bladder are unremarkable. Stomach/Bowel: No bowel obstruction or ileus. Normal appendix right lower quadrant. No bowel wall thickening or inflammatory change. Vascular/Lymphatic: Aortic atherosclerosis. No enlarged abdominal or pelvic lymph nodes. Reproductive: Uterus is lobular and slightly enlarged consistent with underlying fibroids. No adnexal masses. Other: No free fluid or free intraperitoneal gas. No abdominal wall hernia. Musculoskeletal: No acute or destructive bony abnormalities. Postsurgical changes from L3 through L5. Reconstructed images demonstrate no additional findings. IMPRESSION: 1. No acute intrathoracic, intra-abdominal, or intrapelvic process on this unenhanced exam. 2. Cardiomegaly. 3. Fibroid uterus. 4.  Aortic Atherosclerosis (ICD10-I70.0). Electronically Signed   By: Sharlet Salina M.D.   On: 05/06/2023 17:33   CT HEAD WO CONTRAST Result Date: 05/06/2023 CLINICAL DATA:  Altered level of consciousness EXAM: CT HEAD WITHOUT CONTRAST TECHNIQUE: Contiguous axial images were obtained from the base of the skull through the vertex without intravenous contrast. RADIATION DOSE  REDUCTION: This exam was performed according to the departmental dose-optimization program which includes automated exposure control, adjustment of the mA and/or kV according to patient size and/or use of iterative reconstruction technique. COMPARISON:  12/11/2022 FINDINGS: Brain: No acute infarct or hemorrhage. Lateral ventricles and midline structures are unremarkable. No acute extra-axial fluid collections. No mass effect. Vascular: No hyperdense vessel or unexpected calcification. Skull: Normal. Negative for fracture or focal lesion. Sinuses/Orbits: No acute finding. Other: None. IMPRESSION: 1. No acute intracranial process. Electronically Signed   By: Sharlet Salina M.D.   On: 05/06/2023 17:28    Microbiology: Results for orders placed or performed during the hospital encounter of 05/06/23  Resp panel by RT-PCR (RSV, Flu A&B, Covid) Anterior Nasal Swab     Status: Abnormal   Collection Time: 05/06/23  4:01 PM   Specimen: Anterior Nasal Swab  Result Value Ref Range Status   SARS Coronavirus 2 by RT PCR NEGATIVE NEGATIVE Final    Comment: (NOTE) SARS-CoV-2 target nucleic acids are NOT DETECTED.  The SARS-CoV-2 RNA is generally detectable in upper respiratory specimens during the acute phase of infection. The lowest concentration of SARS-CoV-2 viral copies this assay can detect is 138 copies/mL. A negative result does not preclude SARS-Cov-2 infection and should not be used as the sole basis for treatment or other patient management decisions. A negative result may occur with  improper specimen collection/handling, submission of specimen other than nasopharyngeal swab, presence of viral mutation(s) within the areas targeted by this assay, and inadequate number of viral copies(<138 copies/mL). A negative result must be combined with clinical observations, patient history, and epidemiological information. The expected result is Negative.  Fact Sheet for Patients:   BloggerCourse.com  Fact Sheet for Healthcare Providers:  SeriousBroker.it  This test is no t yet approved or cleared by the [place] FDA and  has been authorized for detection and/or diagnosis of SARS-CoV-2 by FDA under an Emergency Use Authorization (EUA). This EUA will remain  in effect (meaning this test can be used) for the duration of the COVID-19 declaration under Section 564(b)(1) of the Act, 21 U.S.C.section 360bbb-3(b)(1), unless the authorization is terminated  or revoked sooner.       Influenza A by PCR POSITIVE (A) NEGATIVE Final   Influenza B by PCR NEGATIVE NEGATIVE Final    Comment: (NOTE) The Xpert Xpress SARS-CoV-2/FLU/RSV plus assay is intended  as an aid in the diagnosis of influenza from Nasopharyngeal swab specimens and should not be used as a sole basis for treatment. Nasal washings and aspirates are unacceptable for Xpert Xpress SARS-CoV-2/FLU/RSV testing.  Fact Sheet for Patients: BloggerCourse.com  Fact Sheet for Healthcare Providers: SeriousBroker.it  This test is not yet approved or cleared by the [place] FDA and has been authorized for detection and/or diagnosis of SARS-CoV-2 by FDA under an Emergency Use Authorization (EUA). This EUA will remain in effect (meaning this test can be used) for the duration of the COVID-19 declaration under Section 564(b)(1) of the Act, 21 U.S.C. section 360bbb-3(b)(1), unless the authorization is terminated or revoked.     Resp Syncytial Virus by PCR NEGATIVE NEGATIVE Final    Comment: (NOTE) Fact Sheet for Patients: BloggerCourse.com  Fact Sheet for Healthcare Providers: SeriousBroker.it  This test is not yet approved or cleared by the [place] FDA and has been authorized for detection and/or diagnosis of SARS-CoV-2 by FDA under an Emergency Use  Authorization (EUA). This EUA will remain in effect (meaning this test can be used) for the duration of the COVID-19 declaration under Section 564(b)(1) of the Act, 21 U.S.C. section 360bbb-3(b)(1), unless the authorization is terminated or revoked.  Performed at [facility], [address] [place], [place] [address]   Blood Culture (routine x 2)     Status: None   Collection Time: 05/06/23  4:05 PM   Specimen: BLOOD RIGHT ARM  Result Value Ref Range Status   Specimen Description BLOOD RIGHT ARM BLOOD  Final   Special Requests   Final    BOTTLES DRAWN AEROBIC AND ANAEROBIC Blood Culture results may not be optimal due to an inadequate volume of blood received in culture bottles   Culture   Final    NO GROWTH 5 DAYS Performed at [facility], [address], [place], [place] [address]    Report Status 05/11/2023 FINAL  Final  Blood Culture (routine x 2)     Status: None   Collection Time: 05/06/23  7:30 PM   Specimen: BLOOD LEFT HAND  Result Value Ref Range Status   Specimen Description BLOOD LEFT HAND BLOOD  Final   Special Requests AEROBIC BOTTLE ONLY Blood Culture adequate volume  Final   Culture   Final    NO GROWTH 5 DAYS Performed at [facility], [address], [place], [place] [address]    Report Status 05/11/2023 FINAL  Final    Labs: CBC: Recent Labs  Lab 05/13/23 0637 05/14/23 0640 05/15/23 0525 05/16/23 0554 05/17/23 0618  WBC 8.3 7.1 7.3 8.0 8.8  HGB 10.5* 9.9* 9.9* 11.0* 10.3*  HCT 32.1* 30.0* 30.4* 33.7* 31.8*  MCV 89.2 89.3 89.7 89.9 91.1  PLT 562* 553* 468* 571* 562*   Basic Metabolic Panel: Recent Labs  Lab 05/12/23 0652 05/13/23 0637 05/14/23 0640 05/15/23 0525 05/16/23 0554  NA 137 138 138 139 135  K 3.9 4.0 3.9 4.1 4.5  CL 108 110 107 106 101  CO2 20* 21* 23 21* 23  GLUCOSE 85 79 87 121* 92  BUN 16 11 8 7 9   CREATININE 1.15* 1.07* 0.80 0.90 0.95  CALCIUM 8.6* 8.7* 8.7* 9.0 9.3   Liver Function Tests: Recent Labs  Lab 05/12/23 0652  05/13/23 0637 05/14/23 0640 05/15/23 0525 05/16/23 0554  AST 21 31 35 36 46*  ALT 17 24 31  35 47*  ALKPHOS 63 73 65 64 91  BILITOT 0.4 0.4 0.4 0.2 0.4  PROT 6.1* 6.4* 6.1* 6.4* 7.3  ALBUMIN 2.9* 3.0* 2.9* 3.0* 3.4*   CBG: No results for input(s): "GLUCAP" in the last 168 hours.  Discharge time spent: greater than 30 minutes.  Signed: Briant Cedar, MD [facility] 05/18/2023

## 2023-05-18 NOTE — TOC Transition Note (Signed)
Transition of Care [facility] - Discharge Note   Patient Details  Name: Kathryn [name] MRN: [id number] Date of Birth: [birth year]  Transition of [facility] CM/SW Contact:  Kermit Balo, RN Phone Number: 05/18/2023, 2:33 PM   Clinical Narrative:     Pt has decided she prefers to d/c home with home health services. Home health arranged with [facility]. Information on the AVS. [facility] will contact her for the first home visit. Rollator delivered to the room per [employer].  PcP. Guadlupe Spanish NP Pt has transportation home.  Final next level of care: Home w Home Health Services Barriers to Discharge: No Barriers Identified   Patient Goals and CMS Choice   CMS Medicare.gov Compare Post Acute Care list provided to:: Patient Choice offered to / list presented to : Patient      Discharge Placement                       Discharge Plan and Services Additional resources added to the After Visit Summary for     Discharge Planning Services: CM Consult Post Acute Care Choice: IP Rehab          DME Arranged: Walker rolling with seat DME Agency: [employer] Date DME Agency Contacted: 05/18/23   Representative spoke with at DME Agency: Zack HH Arranged: PT, OT HH Agency: [facility] Date [facility] Agency Contacted: 05/18/23   Representative spoke with at [facility] Agency: Tresa Endo  Social Drivers of Health (SDOH) Interventions SDOH Screenings   Food Insecurity: No Food Insecurity (05/09/2023)  Housing: High Risk (05/09/2023)  Transportation Needs: No Transportation Needs (05/09/2023)  Utilities: Not At Risk (05/09/2023)     Readmission Risk Interventions     No data to display

## 2023-05-20 ENCOUNTER — Emergency Department (HOSPITAL_COMMUNITY): Payer: Medicare HMO

## 2023-05-20 ENCOUNTER — Emergency Department (HOSPITAL_COMMUNITY)
Admission: EM | Admit: 2023-05-20 | Discharge: 2023-05-20 | Disposition: A | Discharge status: Home / Self Care | Payer: Medicare HMO

## 2023-05-20 ENCOUNTER — Encounter (HOSPITAL_COMMUNITY): Payer: Self-pay

## 2023-05-20 ENCOUNTER — Other Ambulatory Visit: Payer: Self-pay

## 2023-05-20 DIAGNOSIS — R040 Epistaxis: Secondary | ICD-10-CM | POA: Diagnosis not present

## 2023-05-20 DIAGNOSIS — R112 Nausea with vomiting, unspecified: Secondary | ICD-10-CM | POA: Insufficient documentation

## 2023-05-20 DIAGNOSIS — R059 Cough, unspecified: Secondary | ICD-10-CM | POA: Diagnosis not present

## 2023-05-20 DIAGNOSIS — Z20822 Contact with and (suspected) exposure to covid-19: Secondary | ICD-10-CM | POA: Insufficient documentation

## 2023-05-20 DIAGNOSIS — Z91010 Allergy to peanuts: Secondary | ICD-10-CM | POA: Insufficient documentation

## 2023-05-20 DIAGNOSIS — R0789 Other chest pain: Secondary | ICD-10-CM | POA: Diagnosis not present

## 2023-05-20 DIAGNOSIS — R519 Headache, unspecified: Secondary | ICD-10-CM | POA: Diagnosis not present

## 2023-05-20 DIAGNOSIS — R42 Dizziness and giddiness: Secondary | ICD-10-CM | POA: Insufficient documentation

## 2023-05-20 DIAGNOSIS — I700 Atherosclerosis of aorta: Secondary | ICD-10-CM | POA: Diagnosis not present

## 2023-05-20 DIAGNOSIS — I517 Cardiomegaly: Secondary | ICD-10-CM | POA: Diagnosis not present

## 2023-05-20 DIAGNOSIS — M62838 Other muscle spasm: Secondary | ICD-10-CM | POA: Insufficient documentation

## 2023-05-20 DIAGNOSIS — R0602 Shortness of breath: Secondary | ICD-10-CM | POA: Diagnosis not present

## 2023-05-20 DIAGNOSIS — Z8673 Personal history of transient ischemic attack (TIA), and cerebral infarction without residual deficits: Secondary | ICD-10-CM | POA: Diagnosis not present

## 2023-05-20 DIAGNOSIS — D259 Leiomyoma of uterus, unspecified: Secondary | ICD-10-CM | POA: Diagnosis not present

## 2023-05-20 LAB — BASIC METABOLIC PANEL
Anion gap: 12 (ref 5–15)
BUN: 11 mg/dL (ref 6–20)
CO2: 23 mmol/L (ref 22–32)
Calcium: 10 mg/dL (ref 8.9–10.3)
Chloride: 100 mmol/L (ref 98–111)
Creatinine, Ser: 0.86 mg/dL (ref 0.44–1.00)
GFR, Estimated: >60 mL/min (ref >60)
Glucose, Bld: 100 mg/dL — ABNORMAL HIGH (ref 70–99)
Potassium: 3.8 mmol/L (ref 3.5–5.1)
Sodium: 135 mmol/L (ref 135–145)

## 2023-05-20 LAB — CBC
HCT: 35.5 % — ABNORMAL LOW (ref 36.0–46.0)
Hemoglobin: 12.1 g/dL (ref 12.0–15.0)
MCH: 29.9 pg (ref 26.0–34.0)
MCHC: 34.1 g/dL (ref 30.0–36.0)
MCV: 87.7 fL (ref 80.0–100.0)
Platelets: 710 10*3/uL — ABNORMAL HIGH (ref 150–400)
RBC: 4.05 MIL/uL (ref 3.87–5.11)
RDW: 15.7 % — ABNORMAL HIGH (ref 11.5–15.5)
WBC: 9.6 10*3/uL (ref 4.0–10.5)
nRBC: 0 % (ref 0.0–0.2)

## 2023-05-20 LAB — RESP PANEL BY RT-PCR (RSV, FLU A&B, COVID)  RVPGX2
Influenza A by PCR: NEGATIVE
Influenza B by PCR: NEGATIVE
Resp Syncytial Virus by PCR: NEGATIVE
SARS Coronavirus 2 by RT PCR: NEGATIVE

## 2023-05-20 LAB — TROPONIN I (HIGH SENSITIVITY): Troponin I (High Sensitivity): 3 ng/L (ref <18)

## 2023-05-20 MED ORDER — DIPHENHYDRAMINE HCL 50 MG/ML IJ SOLN
25.0000 mg | Freq: Once | INTRAMUSCULAR | Status: AC
  Administered 2023-05-20: 25 mg via INTRAVENOUS
  Filled 2023-05-20: qty 1

## 2023-05-20 MED ORDER — IOHEXOL 350 MG/ML SOLN
100.0000 mL | Freq: Once | INTRAVENOUS | Status: AC | PRN
  Administered 2023-05-20: 100 mL via INTRAVENOUS

## 2023-05-20 MED ORDER — PROCHLORPERAZINE EDISYLATE 10 MG/2ML IJ SOLN
10.0000 mg | Freq: Once | INTRAMUSCULAR | Status: AC
  Administered 2023-05-20: 10 mg via INTRAVENOUS
  Filled 2023-05-20: qty 2

## 2023-05-20 MED ORDER — ONDANSETRON 4 MG PO TBDP
4.0000 mg | ORAL_TABLET | Freq: Three times a day (TID) | ORAL | 0 refills | Status: DC | PRN
Start: 2023-05-20 — End: 2023-05-22

## 2023-05-20 MED ORDER — SODIUM CHLORIDE 0.9 % IV BOLUS
500.0000 mL | Freq: Once | INTRAVENOUS | Status: AC
  Administered 2023-05-20: 500 mL via INTRAVENOUS

## 2023-05-20 NOTE — ED Provider Notes (Signed)
[place] EMERGENCY DEPARTMENT AT [facility] Provider Note   CSN: [id number] Arrival date & time: 05/20/23  4098     History  Chief Complaint  Patient presents with   Epistaxis   Spasms   Shortness of Breath   Dizziness    Kathryn [name] is a 53 y.o. female.  53 year old female with past medical history of recent aortic thrombus with multiple strokes as well as seizure disorder in the past and polysubstance abuse presenting to the emergency department today with chest pain, lightheadedness, nausea, vomiting, and headache.  The patient states this been going on now for the past few days since she was discharged from the hospital.  She states that she has had multiple episodes of nonbloody, nonbilious emesis.  She does report intermittent epistaxis although this is not occurring currently.  She states that she is having a global throbbing headache.  She states that while she was in the hospital she was diagnosed with the flu.  She has been having subjective fevers and chills at home but has not checked her temperature.  She does report a mild cough with this.  She came to the ER today further evaluation regarding this.   Epistaxis Associated symptoms: dizziness   Shortness of Breath Dizziness Associated symptoms: shortness of breath        Home Medications Prior to Admission medications   Medication Sig Start Date End Date Taking? Authorizing Provider  ondansetron (ZOFRAN-ODT) 4 MG disintegrating tablet Take 1 tablet (4 mg total) by mouth every 8 (eight) hours as needed for nausea or vomiting. 05/20/23  Yes Durwin Glaze, MD  albuterol (VENTOLIN HFA) 108 (90 Base) MCG/ACT inhaler Inhale 2 puffs into the lungs every 6 (six) hours as needed for wheezing or shortness of breath.    [provider]  amLODipine (NORVASC) 5 MG tablet Take 1 tablet (5 mg total) by mouth daily for 10 days. 03/08/23 03/18/23  Charm Rings, NP  DULoxetine (CYMBALTA) 30 MG capsule Take 3  capsules (90 mg total) by mouth daily. 03/30/23   Peterson Ao, MD  fluticasone furoate-vilanterol (BREO ELLIPTA) 200-25 MCG/ACT AEPB Inhale 1 puff into the lungs daily. 01/17/23   Benjiman Core, MD  gabapentin (NEURONTIN) 300 MG capsule Take 2 capsules (600 mg total) by mouth daily. 03/31/23   Peterson Ao, MD  lidocaine (LIDODERM) 5 % Place 1 patch onto the skin daily. Remove & Discard patch within 12 hours or as directed by MD 03/31/23   Peterson Ao, MD  nicotine (NICODERM CQ - DOSED IN MG/24 HOURS) 14 mg/24hr patch Place 1 patch (14 mg total) onto the skin daily. 03/31/23   Peterson Ao, MD  omeprazole (PRILOSEC) 20 MG capsule Take 20 mg by mouth daily before breakfast. 02/07/23   [provider]  QUEtiapine (SEROQUEL) 100 MG tablet Take 1 tablet (100 mg total) by mouth at bedtime. 03/30/23   Peterson Ao, MD      Allergies    Fish allergy, Flexeril [cyclobenzaprine hcl], Ibuprofen, Shellfish allergy, Tylenol [acetaminophen], Tramadol, Ace inhibitors, Peanut allergen powder-dnfp, and Trazodone and nefazodone    Review of Systems   Review of Systems  HENT:  Positive for nosebleeds.   Respiratory:  Positive for shortness of breath.   Neurological:  Positive for dizziness.  All other systems reviewed and are negative.   Physical Exam Updated Vital Signs BP (!) 151/98   Pulse 82   Temp 98.5 F (36.9 C) (Oral)   Resp (!) 22  Ht 4\' 11"  (1.499 m)   Wt 76.2 kg   LMP 06/07/2020 Comment: CT showed evidence of uterus and cervix  SpO2 100%   BMI 33.93 kg/m  Physical Exam Vitals and nursing note reviewed.   Gen: NAD Eyes: PERRL, EOMI HEENT: no oropharyngeal swelling, no dried blood or active epistaxis noted Neck: trachea midline Resp: clear to auscultation bilaterally Card: RRR, no murmurs, rubs, or gallops Abd: Mild diffuse tenderness without guarding or rebound Extremities: no calf tenderness, no edema Vascular: 2+ radial pulses bilaterally, 2+ DP  pulses bilaterally Neuro: No focal deficits Skin: no rashes Psyc: acting appropriately   ED Results / Procedures / Treatments   Labs (all labs ordered are listed, but only abnormal results are displayed) Labs Reviewed  BASIC METABOLIC PANEL - Abnormal; Notable for the following components:      Result Value   Glucose, Bld 100 (*)    All other components within normal limits  CBC - Abnormal; Notable for the following components:   HCT 35.5 (*)    RDW 15.7 (*)    Platelets 710 (*)    All other components within normal limits  RESP PANEL BY RT-PCR (RSV, FLU A&B, COVID)  [id number]  TROPONIN I (HIGH SENSITIVITY)    EKG EKG Interpretation Date/Time:  Sunday May 20 2023 09:34:10 EST Ventricular Rate:  84 PR Interval:  134 QRS Duration:  88 QT Interval:  405 QTC Calculation: 479 R Axis:   68  Text Interpretation: Sinus rhythm Abnormal R-wave progression, late transition Confirmed by Beckey Downing [phone]) on 05/20/2023 9:43:51 AM  Radiology CT Angio Chest/Abd/Pel for Dissection W and/or Wo Contrast Result Date: 05/20/2023 CLINICAL DATA:  Left chest tightness, history of aortic thrombus. EXAM: CT ANGIOGRAPHY CHEST, ABDOMEN AND PELVIS TECHNIQUE: Non-contrast CT of the chest was initially obtained. Multidetector CT imaging through the chest, abdomen and pelvis was performed using the standard protocol during bolus administration of intravenous contrast. Multiplanar reconstructed images and MIPs were obtained and reviewed to evaluate the vascular anatomy. RADIATION DOSE REDUCTION: This exam was performed according to the departmental dose-optimization program which includes automated exposure control, adjustment of the mA and/or kV according to patient size and/or use of iterative reconstruction technique. CONTRAST:  OMNIPAQUE IOHEXOL 350 MG/ML SOLN COMPARISON:  May 16, 2023.  May 06, 2023. FINDINGS: CTA CHEST FINDINGS Cardiovascular: Preferential opacification of the thoracic  aorta. No evidence of thoracic aortic aneurysm or dissection. Normal heart size. No pericardial effusion. Mediastinum/Nodes: No enlarged mediastinal, hilar, or axillary lymph nodes. Thyroid gland, trachea, and esophagus demonstrate no significant findings. Lungs/Pleura: Lungs are clear. No pleural effusion or pneumothorax. Musculoskeletal: No chest wall abnormality. No acute or significant osseous findings. Review of the MIP images confirms the above findings. CTA ABDOMEN AND PELVIS FINDINGS VASCULAR Aorta: Aortic atherosclerosis is noted without aneurysm or dissection. Celiac: Patent without evidence of aneurysm, dissection, vasculitis or significant stenosis. SMA: Patent without evidence of aneurysm, dissection, vasculitis or significant stenosis. Renals: Both renal arteries are patent without evidence of aneurysm, dissection, vasculitis, fibromuscular dysplasia or significant stenosis. IMA: Patent without evidence of aneurysm, dissection, vasculitis or significant stenosis. Inflow: Patent without evidence of aneurysm, dissection, vasculitis or significant stenosis. Veins: No obvious venous abnormality within the limitations of this arterial phase study. Review of the MIP images confirms the above findings. NON-VASCULAR Hepatobiliary: No focal liver abnormality is seen. No gallstones, gallbladder wall thickening, or biliary dilatation. Pancreas: Unremarkable. No pancreatic ductal dilatation or surrounding inflammatory changes. Spleen: Normal in size without focal abnormality.  Adrenals/Urinary Tract: Adrenal glands are unremarkable. Kidneys are normal, without renal calculi, focal lesion, or hydronephrosis. Bladder is unremarkable. Stomach/Bowel: Stomach is within normal limits. Appendix appears normal. No evidence of bowel wall thickening, distention, or inflammatory changes. Lymphatic: No adenopathy is noted. Reproductive: 3.9 cm uterine fibroid is noted. No adnexal abnormality is noted. Other: No abdominal wall  hernia or abnormality. No abdominopelvic ascites. Musculoskeletal: Status post surgical posterior fusion of L3-4 and L4-5. No acute osseous abnormality is noted. Review of the MIP images confirms the above findings. IMPRESSION: No evidence of thoracic or abdominal aortic dissection or aneurysm. 3.9 cm uterine fibroid. Aortic Atherosclerosis (ICD10-I70.0). Electronically Signed   By: Lupita Raider M.D.   On: 05/20/2023 13:02   CT Head Wo Contrast Result Date: 05/20/2023 CLINICAL DATA:  Headache with increasing frequency or severity. EXAM: CT HEAD WITHOUT CONTRAST TECHNIQUE: Contiguous axial images were obtained from the base of the skull through the vertex without intravenous contrast. RADIATION DOSE REDUCTION: This exam was performed according to the departmental dose-optimization program which includes automated exposure control, adjustment of the mA and/or kV according to patient size and/or use of iterative reconstruction technique. COMPARISON:  12/11/2022 CTA of the head neck FINDINGS: Brain: No evidence of acute infarction, hemorrhage, hydrocephalus, extra-axial collection or mass lesion/mass effect. Vascular: No hyperdense vessel. Skull: Normal. Negative for fracture or focal lesion. Sinuses/Orbits: No acute finding. IMPRESSION: Negative head CT. No explanation for headache. Electronically Signed   By: Tiburcio Pea M.D.   On: 05/20/2023 11:56   DG Chest Port 1 View Result Date: 05/20/2023 CLINICAL DATA:  Shortness of breath EXAM: PORTABLE CHEST 1 VIEW COMPARISON:  05/06/2023 FINDINGS: Cardiac silhouette is mildly enlarged, stable. No focal airspace consolidation, pleural effusion, or pneumothorax. IMPRESSION: Mild cardiomegaly. No acute cardiopulmonary findings. Electronically Signed   By: Duanne Guess D.O.   On: 05/20/2023 10:45    Procedures Procedures    Medications Ordered in ED Medications  prochlorperazine (COMPAZINE) injection 10 mg (10 mg Intravenous Given 05/20/23 1023)   diphenhydrAMINE (BENADRYL) injection 25 mg (25 mg Intravenous Given 05/20/23 1023)  sodium chloride 0.9 % bolus 500 mL (500 mLs Intravenous Bolus 05/20/23 1026)  iohexol (OMNIPAQUE) 350 MG/ML injection 100 mL (100 mLs Intravenous Contrast Given 05/20/23 1208)    ED Course/ Medical Decision Making/ A&P                                 Medical Decision Making 53 year old female with past medical history of recent aortic thrombus as well as seizures, CVA, and polysubstance abuse presenting to the emergency department today with multiple complaints.  I will further evaluate the patient here with a repeat CT angiogram for further evaluation for recurrent thrombus or dissection given her complaints of chest pain.  Will obtain a CT scan of her head in regards to her headaches to eval for intracranial hemorrhage or mass lesion as the patient is on Eliquis.  I will obtain an EKG and troponin to eval for ACS.  Will give the patient Compazine and Benadryl for her headache.  I will obtain a COVID and flu swab on the patient.  She has recently been diagnosed with flu so some of the symptoms certainly could be explained by this.  I will give patient some IV fluids.  I will reevaluate for ultimate disposition.  If her workup is stable and symptoms improve she could potentially be discharged.  Will reevaluate for  ultimate disposition.  The patient's labs here are reassuring.  Troponin is negative.  CT angiogram does not show any acute findings.  On reassessment the patient is feeling better and is not had any more vomiting since she has been here.  I think that she is stable for discharge.  She is ambulatory here without any difficulty.  Pulse ox remained in the high 90s.  She is discharged with return precautions.  Amount and/or Complexity of Data Reviewed Labs: ordered. Radiology: ordered.  Risk Prescription drug management.           Final Clinical Impression(s) / ED Diagnoses Final diagnoses:  Nausea  and vomiting, unspecified vomiting type    Rx / DC Orders ED Discharge Orders          Ordered    ondansetron (ZOFRAN-ODT) 4 MG disintegrating tablet  Every 8 hours PRN        05/20/23 1427              Durwin Glaze, MD 05/20/23 1427

## 2023-05-20 NOTE — ED Triage Notes (Signed)
Patient come in POV, Patient stated "had blood clots two weeks ago and was discharged from [facility] two days ago. Has been having nose bleeds since yesterday and head aches, spasms and dizziness, shortness of breath and tightness in left chest."

## 2023-05-20 NOTE — Discharge Instructions (Addendum)
Your workup today was reassuring.  Please take the Zofran as needed for nausea.  Follow-up with your doctor.  Return to the ER for worsening symptoms.

## 2023-05-20 NOTE — ED Notes (Signed)
Pt ambulated around the department without assistance and with a steady gait, O2 sats maintained at 96-98 % on room air.

## 2023-05-21 ENCOUNTER — Encounter (HOSPITAL_COMMUNITY): Payer: Self-pay

## 2023-05-21 ENCOUNTER — Other Ambulatory Visit: Payer: Self-pay

## 2023-05-21 ENCOUNTER — Emergency Department (HOSPITAL_COMMUNITY): Payer: Medicare HMO

## 2023-05-21 ENCOUNTER — Emergency Department (HOSPITAL_COMMUNITY)
Admission: EM | Admit: 2023-05-21 | Discharge: 2023-05-22 | Payer: Medicare HMO | Attending: Emergency Medicine | Admitting: Emergency Medicine

## 2023-05-21 DIAGNOSIS — Z7901 Long term (current) use of anticoagulants: Secondary | ICD-10-CM | POA: Diagnosis not present

## 2023-05-21 DIAGNOSIS — R042 Hemoptysis: Secondary | ICD-10-CM | POA: Diagnosis not present

## 2023-05-21 DIAGNOSIS — F1410 Cocaine abuse, uncomplicated: Secondary | ICD-10-CM | POA: Insufficient documentation

## 2023-05-21 DIAGNOSIS — R45851 Suicidal ideations: Secondary | ICD-10-CM | POA: Diagnosis not present

## 2023-05-21 DIAGNOSIS — Z8673 Personal history of transient ischemic attack (TIA), and cerebral infarction without residual deficits: Secondary | ICD-10-CM | POA: Diagnosis not present

## 2023-05-21 DIAGNOSIS — J449 Chronic obstructive pulmonary disease, unspecified: Secondary | ICD-10-CM | POA: Diagnosis not present

## 2023-05-21 DIAGNOSIS — Z91010 Allergy to peanuts: Secondary | ICD-10-CM | POA: Insufficient documentation

## 2023-05-21 DIAGNOSIS — R059 Cough, unspecified: Secondary | ICD-10-CM | POA: Diagnosis not present

## 2023-05-21 DIAGNOSIS — F322 Major depressive disorder, single episode, severe without psychotic features: Secondary | ICD-10-CM | POA: Diagnosis present

## 2023-05-21 NOTE — ED Triage Notes (Addendum)
Pt states that she wants to detox from cocaine. Last use was Saturday. Pt states she is homeless. Wants to go to detox and rehab.

## 2023-05-21 NOTE — ED Provider Notes (Signed)
[facility] AT [facility] Provider Note   CSN: [id number] Arrival date & time: 05/21/23  1909     History {Add pertinent medical, surgical, social history, OB history to HPI:1} No chief complaint on file.   Kathryn [name] is a 53 y.o. female.  HPI     This is a 53 year old female with past medical history with recent aortic thrombus, CVA, seizure disorder, polysubstance abuse who presents with concern for cocaine abuse.  Patient reports that she got discharged from the hospital.  She reports she is depressed about her medical and living situations.  Because of this she relapsed into cocaine use and has used over the last 3 days.  She occasionally drinks alcohol but does not drink daily.  She reports that she does not have a place to stay and does not have any money to pay for anywhere to stay.  She expresses being suicidal and "I just want to go to sleep and not wake up."  She reports that she would walk out in front of a car.  Patient seen as recently as yesterday with multiple somatic complaints.  At that time had full lab work which was reassuring.   Home Medications Prior to Admission medications   Medication Sig Start Date End Date Taking? Authorizing Provider  albuterol (VENTOLIN HFA) 108 (90 Base) MCG/ACT inhaler Inhale 2 puffs into the lungs every 6 (six) hours as needed for wheezing or shortness of breath.    [provider]  albuterol (VENTOLIN HFA) 108 (90 Base) MCG/ACT inhaler Inhale 1-2 puffs into the lungs every 6 (six) hours as needed for wheezing or shortness of breath. 03/12/23   [provider]  amLODipine (NORVASC) 5 MG tablet Take 1 tablet (5 mg total) by mouth daily for 10 days. 03/08/23 03/18/23  Charm Rings, NP  apixaban (ELIQUIS) 5 MG TABS tablet Take 1 tablet (5 mg total) by mouth 2 (two) times daily. 05/18/23   Briant Cedar, MD  atorvastatin (LIPITOR) 40 MG tablet Take 1 tablet (40 mg total) by mouth  daily. 05/19/23   Briant Cedar, MD  baclofen (LIORESAL) 20 MG tablet Take 20 mg by mouth 3 (three) times daily as needed. 04/05/23   [provider]  Earlie Server 200-25 MCG/ACT AEPB Inhale 2 puffs into the lungs 2 (two) times daily. 04/10/23   [provider]  citalopram (CELEXA) 20 MG tablet Take 20 mg by mouth daily. 04/17/23   [provider]  cyanocobalamin (VITAMIN B12) 1000 MCG tablet Take 1 tablet (1,000 mcg total) by mouth daily. 05/18/23 06/17/23  Briant Cedar, MD  docusate sodium (COLACE) 100 MG capsule Take 1 capsule (100 mg total) by mouth 2 (two) times daily. 05/18/23   Briant Cedar, MD  DULoxetine (CYMBALTA) 30 MG capsule Take 3 capsules (90 mg total) by mouth daily. 03/30/23   Peterson Ao, MD  fluticasone furoate-vilanterol (BREO ELLIPTA) 200-25 MCG/ACT AEPB Inhale 1 puff into the lungs daily. 01/17/23   Benjiman Core, MD  folic acid (FOLVITE) 1 MG tablet Take 1 tablet (1 mg total) by mouth daily. 05/19/23   Briant Cedar, MD  gabapentin (NEURONTIN) 300 MG capsule Take 2 capsules (600 mg total) by mouth daily. 03/31/23   Peterson Ao, MD  hydrochlorothiazide (HYDRODIURIL) 25 MG tablet Take 25 mg by mouth daily. 04/17/23   [provider]  lidocaine (LIDODERM) 5 % Place 1 patch onto the skin daily. Remove & Discard patch within 12  hours or as directed by MD 03/31/23   Peterson Ao, MD  losartan (COZAAR) 25 MG tablet Take 12.5 mg by mouth daily. 04/17/23   [provider]  Multiple Vitamin (MULTIVITAMIN WITH MINERALS) TABS tablet Take 1 tablet by mouth daily. 05/19/23   Briant Cedar, MD  nicotine (NICODERM CQ - DOSED IN MG/24 HOURS) 14 mg/24hr patch Place 1 patch (14 mg total) onto the skin daily. 03/31/23   Peterson Ao, MD  omeprazole (PRILOSEC) 20 MG capsule Take 20 mg by mouth daily before breakfast. 02/07/23   [provider]  omeprazole (PRILOSEC) 20 MG capsule Take 20 mg by mouth  daily. 04/06/23   [provider]  ondansetron (ZOFRAN-ODT) 4 MG disintegrating tablet Take 1 tablet (4 mg total) by mouth every 8 (eight) hours as needed for nausea or vomiting. 05/20/23   Durwin Glaze, MD  oxyCODONE (OXY IR/ROXICODONE) 5 MG immediate release tablet Take 1 tablet (5 mg total) by mouth every 6 (six) hours as needed for up to 3 days for moderate pain (pain score 4-6). 05/18/23 05/21/23  Briant Cedar, MD  polyethylene glycol powder (GLYCOLAX/MIRALAX) 17 GM/SCOOP powder Take 1 capful (17 g) by mouth daily. 05/18/23   Briant Cedar, MD  pregabalin (LYRICA) 200 MG capsule Take 200 mg by mouth in the morning and at bedtime. 04/17/23   [provider]  QUEtiapine (SEROQUEL) 100 MG tablet Take 1 tablet (100 mg total) by mouth at bedtime. 03/30/23   Peterson Ao, MD  QUEtiapine (SEROQUEL) 300 MG tablet Take 300 mg by mouth at bedtime. 04/17/23   [provider]  spironolactone (ALDACTONE) 25 MG tablet Take 25 mg by mouth daily. 04/07/23   [provider]  thiamine (VITAMIN B1) 100 MG tablet Take 2.5 tablets (250 mg total) by mouth daily. 05/19/23   Briant Cedar, MD      Allergies    Fish allergy, Flexeril [cyclobenzaprine hcl], Ibuprofen, Motrin [ibuprofen], Shellfish allergy, Tylenol [acetaminophen], Tramadol, Ace inhibitors, Flexeril [cyclobenzaprine], Peanut allergen powder-dnfp, and Trazodone and nefazodone    Review of Systems   Review of Systems  Psychiatric/Behavioral:  Positive for suicidal ideas.   All other systems reviewed and are negative.   Physical Exam Updated Vital Signs BP (!) 140/88 (BP Location: Right Arm)   Pulse 95   Temp 98.2 F (36.8 C) (Oral)   Resp 15   Wt 76.2 kg   LMP 06/07/2020 Comment: CT showed evidence of uterus and cervix  SpO2 98%   BMI 33.93 kg/m  Physical Exam Vitals and nursing note reviewed.  Constitutional:      Appearance: She is well-developed. She is obese. She is not  ill-appearing.  HENT:     Head: Normocephalic and atraumatic.  Eyes:     Pupils: Pupils are equal, round, and reactive to light.  Cardiovascular:     Rate and Rhythm: Normal rate and regular rhythm.  Pulmonary:     Effort: Pulmonary effort is normal. No respiratory distress.  Abdominal:     Palpations: Abdomen is soft.  Musculoskeletal:     Cervical back: Neck supple.  Skin:    General: Skin is warm and dry.  Neurological:     Mental Status: She is alert and oriented to person, place, and time.  Psychiatric:        Mood and Affect: Mood normal.     ED Results / Procedures / Treatments   Labs (all labs ordered are listed, but only abnormal results are  displayed) Labs Reviewed - No data to display  EKG None  Radiology CT Angio Chest/Abd/Pel for Dissection W and/or Wo Contrast Result Date: 05/20/2023 CLINICAL DATA:  Left chest tightness, history of aortic thrombus. EXAM: CT ANGIOGRAPHY CHEST, ABDOMEN AND PELVIS TECHNIQUE: Non-contrast CT of the chest was initially obtained. Multidetector CT imaging through the chest, abdomen and pelvis was performed using the standard protocol during bolus administration of intravenous contrast. Multiplanar reconstructed images and MIPs were obtained and reviewed to evaluate the vascular anatomy. RADIATION DOSE REDUCTION: This exam was performed according to the departmental dose-optimization program which includes automated exposure control, adjustment of the mA and/or kV according to patient size and/or use of iterative reconstruction technique. CONTRAST:  OMNIPAQUE IOHEXOL 350 MG/ML SOLN COMPARISON:  May 16, 2023.  May 06, 2023. FINDINGS: CTA CHEST FINDINGS Cardiovascular: Preferential opacification of the thoracic aorta. No evidence of thoracic aortic aneurysm or dissection. Normal heart size. No pericardial effusion. Mediastinum/Nodes: No enlarged mediastinal, hilar, or axillary lymph nodes. Thyroid gland, trachea, and esophagus  demonstrate no significant findings. Lungs/Pleura: Lungs are clear. No pleural effusion or pneumothorax. Musculoskeletal: No chest wall abnormality. No acute or significant osseous findings. Review of the MIP images confirms the above findings. CTA ABDOMEN AND PELVIS FINDINGS VASCULAR Aorta: Aortic atherosclerosis is noted without aneurysm or dissection. Celiac: Patent without evidence of aneurysm, dissection, vasculitis or significant stenosis. SMA: Patent without evidence of aneurysm, dissection, vasculitis or significant stenosis. Renals: Both renal arteries are patent without evidence of aneurysm, dissection, vasculitis, fibromuscular dysplasia or significant stenosis. IMA: Patent without evidence of aneurysm, dissection, vasculitis or significant stenosis. Inflow: Patent without evidence of aneurysm, dissection, vasculitis or significant stenosis. Veins: No obvious venous abnormality within the limitations of this arterial phase study. Review of the MIP images confirms the above findings. NON-VASCULAR Hepatobiliary: No focal liver abnormality is seen. No gallstones, gallbladder wall thickening, or biliary dilatation. Pancreas: Unremarkable. No pancreatic ductal dilatation or surrounding inflammatory changes. Spleen: Normal in size without focal abnormality. Adrenals/Urinary Tract: Adrenal glands are unremarkable. Kidneys are normal, without renal calculi, focal lesion, or hydronephrosis. Bladder is unremarkable. Stomach/Bowel: Stomach is within normal limits. Appendix appears normal. No evidence of bowel wall thickening, distention, or inflammatory changes. Lymphatic: No adenopathy is noted. Reproductive: 3.9 cm uterine fibroid is noted. No adnexal abnormality is noted. Other: No abdominal wall hernia or abnormality. No abdominopelvic ascites. Musculoskeletal: Status post surgical posterior fusion of L3-4 and L4-5. No acute osseous abnormality is noted. Review of the MIP images confirms the above findings.  IMPRESSION: No evidence of thoracic or abdominal aortic dissection or aneurysm. 3.9 cm uterine fibroid. Aortic Atherosclerosis (ICD10-I70.0). Electronically Signed   By: Lupita Raider M.D.   On: 05/20/2023 13:02   CT Head Wo Contrast Result Date: 05/20/2023 CLINICAL DATA:  Headache with increasing frequency or severity. EXAM: CT HEAD WITHOUT CONTRAST TECHNIQUE: Contiguous axial images were obtained from the base of the skull through the vertex without intravenous contrast. RADIATION DOSE REDUCTION: This exam was performed according to the departmental dose-optimization program which includes automated exposure control, adjustment of the mA and/or kV according to patient size and/or use of iterative reconstruction technique. COMPARISON:  12/11/2022 CTA of the head neck FINDINGS: Brain: No evidence of acute infarction, hemorrhage, hydrocephalus, extra-axial collection or mass lesion/mass effect. Vascular: No hyperdense vessel. Skull: Normal. Negative for fracture or focal lesion. Sinuses/Orbits: No acute finding. IMPRESSION: Negative head CT. No explanation for headache. Electronically Signed   By: Tiburcio Pea M.D.   On: 05/20/2023  11:56   DG Chest Port 1 View Result Date: 05/20/2023 CLINICAL DATA:  Shortness of breath EXAM: PORTABLE CHEST 1 VIEW COMPARISON:  05/06/2023 FINDINGS: Cardiac silhouette is mildly enlarged, stable. No focal airspace consolidation, pleural effusion, or pneumothorax. IMPRESSION: Mild cardiomegaly. No acute cardiopulmonary findings. Electronically Signed   By: Duanne Guess D.O.   On: 05/20/2023 10:45    Procedures Procedures  {Document cardiac monitor, telemetry assessment procedure when appropriate:1}  Medications Ordered in ED Medications - No data to display  ED Course/ Medical Decision Making/ A&P   {   Click here for ABCD2, HEART and other calculatorsREFRESH Note before signing :1}                              Medical Decision Making  ***  {Document  critical care time when appropriate:1} {Document review of labs and clinical decision tools ie heart score, Chads2Vasc2 etc:1}  {Document your independent review of radiology images, and any outside records:1} {Document your discussion with family members, caretakers, and with consultants:1} {Document social determinants of health affecting pt's care:1} {Document your decision making why or why not admission, treatments were needed:1} Final Clinical Impression(s) / ED Diagnoses Final diagnoses:  None    Rx / DC Orders ED Discharge Orders     None

## 2023-05-22 ENCOUNTER — Encounter (HOSPITAL_COMMUNITY): Payer: Self-pay | Admitting: Psychiatry

## 2023-05-22 ENCOUNTER — Inpatient Hospital Stay (HOSPITAL_COMMUNITY)
Admission: AD | Admit: 2023-05-22 | Discharge: 2023-05-29 | DRG: 885 | Disposition: A | Discharge status: Rehabilitation Facility | Payer: Medicare HMO | Source: Intra-hospital | Attending: Psychiatry | Admitting: Psychiatry

## 2023-05-22 DIAGNOSIS — M47813 Spondylosis without myelopathy or radiculopathy, cervicothoracic region: Secondary | ICD-10-CM | POA: Diagnosis not present

## 2023-05-22 DIAGNOSIS — Z886 Allergy status to analgesic agent status: Secondary | ICD-10-CM

## 2023-05-22 DIAGNOSIS — J449 Chronic obstructive pulmonary disease, unspecified: Secondary | ICD-10-CM | POA: Diagnosis not present

## 2023-05-22 DIAGNOSIS — E7800 Pure hypercholesterolemia, unspecified: Secondary | ICD-10-CM | POA: Diagnosis present

## 2023-05-22 DIAGNOSIS — F333 Major depressive disorder, recurrent, severe with psychotic symptoms: Principal | ICD-10-CM | POA: Diagnosis present

## 2023-05-22 DIAGNOSIS — Z79899 Other long term (current) drug therapy: Secondary | ICD-10-CM | POA: Diagnosis not present

## 2023-05-22 DIAGNOSIS — Z5902 Unsheltered homelessness: Secondary | ICD-10-CM | POA: Diagnosis not present

## 2023-05-22 DIAGNOSIS — M545 Low back pain, unspecified: Secondary | ICD-10-CM | POA: Diagnosis not present

## 2023-05-22 DIAGNOSIS — Z825 Family history of asthma and other chronic lower respiratory diseases: Secondary | ICD-10-CM | POA: Diagnosis not present

## 2023-05-22 DIAGNOSIS — Z5941 Food insecurity: Secondary | ICD-10-CM

## 2023-05-22 DIAGNOSIS — R45851 Suicidal ideations: Secondary | ICD-10-CM | POA: Diagnosis present

## 2023-05-22 DIAGNOSIS — F4310 Post-traumatic stress disorder, unspecified: Secondary | ICD-10-CM | POA: Diagnosis present

## 2023-05-22 DIAGNOSIS — Z86711 Personal history of pulmonary embolism: Secondary | ICD-10-CM

## 2023-05-22 DIAGNOSIS — F17210 Nicotine dependence, cigarettes, uncomplicated: Secondary | ICD-10-CM | POA: Diagnosis not present

## 2023-05-22 DIAGNOSIS — G629 Polyneuropathy, unspecified: Secondary | ICD-10-CM | POA: Diagnosis present

## 2023-05-22 DIAGNOSIS — Z86718 Personal history of other venous thrombosis and embolism: Secondary | ICD-10-CM

## 2023-05-22 DIAGNOSIS — I10 Essential (primary) hypertension: Secondary | ICD-10-CM | POA: Diagnosis not present

## 2023-05-22 DIAGNOSIS — F322 Major depressive disorder, single episode, severe without psychotic features: Principal | ICD-10-CM | POA: Diagnosis present

## 2023-05-22 DIAGNOSIS — Z803 Family history of malignant neoplasm of breast: Secondary | ICD-10-CM | POA: Diagnosis not present

## 2023-05-22 DIAGNOSIS — Z5986 Financial insecurity: Secondary | ICD-10-CM | POA: Diagnosis not present

## 2023-05-22 DIAGNOSIS — Z7901 Long term (current) use of anticoagulants: Secondary | ICD-10-CM | POA: Diagnosis not present

## 2023-05-22 DIAGNOSIS — Z91010 Allergy to peanuts: Secondary | ICD-10-CM | POA: Diagnosis not present

## 2023-05-22 DIAGNOSIS — Z555 Less than a high school diploma: Secondary | ICD-10-CM | POA: Diagnosis not present

## 2023-05-22 DIAGNOSIS — J45909 Unspecified asthma, uncomplicated: Secondary | ICD-10-CM | POA: Diagnosis not present

## 2023-05-22 DIAGNOSIS — F1010 Alcohol abuse, uncomplicated: Secondary | ICD-10-CM | POA: Diagnosis present

## 2023-05-22 DIAGNOSIS — G8929 Other chronic pain: Secondary | ICD-10-CM | POA: Diagnosis not present

## 2023-05-22 DIAGNOSIS — F1420 Cocaine dependence, uncomplicated: Secondary | ICD-10-CM | POA: Diagnosis not present

## 2023-05-22 DIAGNOSIS — K219 Gastro-esophageal reflux disease without esophagitis: Secondary | ICD-10-CM | POA: Diagnosis present

## 2023-05-22 DIAGNOSIS — Z8673 Personal history of transient ischemic attack (TIA), and cerebral infarction without residual deficits: Secondary | ICD-10-CM

## 2023-05-22 DIAGNOSIS — J4489 Other specified chronic obstructive pulmonary disease: Secondary | ICD-10-CM | POA: Diagnosis present

## 2023-05-22 DIAGNOSIS — Z5982 Transportation insecurity: Secondary | ICD-10-CM

## 2023-05-22 DIAGNOSIS — M549 Dorsalgia, unspecified: Secondary | ICD-10-CM | POA: Diagnosis not present

## 2023-05-22 DIAGNOSIS — Z4789 Encounter for other orthopedic aftercare: Secondary | ICD-10-CM | POA: Diagnosis not present

## 2023-05-22 DIAGNOSIS — Z9151 Personal history of suicidal behavior: Secondary | ICD-10-CM | POA: Diagnosis not present

## 2023-05-22 DIAGNOSIS — Z885 Allergy status to narcotic agent status: Secondary | ICD-10-CM

## 2023-05-22 DIAGNOSIS — Z981 Arthrodesis status: Secondary | ICD-10-CM | POA: Diagnosis not present

## 2023-05-22 DIAGNOSIS — F411 Generalized anxiety disorder: Secondary | ICD-10-CM | POA: Diagnosis present

## 2023-05-22 DIAGNOSIS — Z716 Tobacco abuse counseling: Secondary | ICD-10-CM

## 2023-05-22 DIAGNOSIS — F1410 Cocaine abuse, uncomplicated: Secondary | ICD-10-CM | POA: Diagnosis not present

## 2023-05-22 DIAGNOSIS — Z90711 Acquired absence of uterus with remaining cervical stump: Secondary | ICD-10-CM

## 2023-05-22 DIAGNOSIS — Z8249 Family history of ischemic heart disease and other diseases of the circulatory system: Secondary | ICD-10-CM | POA: Diagnosis not present

## 2023-05-22 DIAGNOSIS — Z87891 Personal history of nicotine dependence: Secondary | ICD-10-CM | POA: Diagnosis not present

## 2023-05-22 DIAGNOSIS — Z888 Allergy status to other drugs, medicaments and biological substances status: Secondary | ICD-10-CM

## 2023-05-22 DIAGNOSIS — Z62810 Personal history of physical and sexual abuse in childhood: Secondary | ICD-10-CM

## 2023-05-22 MED ORDER — HALOPERIDOL 5 MG PO TABS: 5.0000 mg | ORAL_TABLET | Freq: Three times a day (TID) | ORAL | Status: DC | PRN

## 2023-05-22 MED ORDER — LORAZEPAM 2 MG/ML IJ SOLN: 2.0000 mg | Freq: Three times a day (TID) | INTRAMUSCULAR | Status: DC | PRN

## 2023-05-22 MED ORDER — QUETIAPINE FUMARATE 100 MG PO TABS: 100.0000 mg | ORAL_TABLET | Freq: Every day | ORAL | Status: DC

## 2023-05-22 MED ORDER — ALUM & MAG HYDROXIDE-SIMETH 200-200-20 MG/5ML PO SUSP
30.0000 mL | ORAL | Status: DC | PRN
  Administered 2023-05-24: 30 mL via ORAL
  Filled 2023-05-22: qty 30

## 2023-05-22 MED ORDER — HYDROXYZINE HCL 25 MG PO TABS
25.0000 mg | ORAL_TABLET | Freq: Three times a day (TID) | ORAL | Status: DC | PRN
  Administered 2023-05-22 – 2023-05-29 (×9): 25 mg via ORAL
  Filled 2023-05-22 (×9): qty 1

## 2023-05-22 MED ORDER — PREGABALIN 75 MG PO CAPS
75.0000 mg | ORAL_CAPSULE | Freq: Two times a day (BID) | ORAL | Status: DC
  Administered 2023-05-22 – 2023-05-29 (×14): 75 mg via ORAL
  Filled 2023-05-22 (×14): qty 1

## 2023-05-22 MED ORDER — HALOPERIDOL LACTATE 5 MG/ML IJ SOLN: 10.0000 mg | Freq: Three times a day (TID) | INTRAMUSCULAR | Status: DC | PRN

## 2023-05-22 MED ORDER — DIPHENHYDRAMINE HCL 25 MG PO CAPS: 50.0000 mg | ORAL_CAPSULE | Freq: Three times a day (TID) | ORAL | Status: DC | PRN

## 2023-05-22 MED ORDER — QUETIAPINE FUMARATE 100 MG PO TABS
100.0000 mg | ORAL_TABLET | Freq: Every day | ORAL | Status: DC
  Administered 2023-05-22 – 2023-05-25 (×4): 100 mg via ORAL
  Filled 2023-05-22 (×7): qty 1

## 2023-05-22 MED ORDER — MAGNESIUM HYDROXIDE 400 MG/5ML PO SUSP
30.0000 mL | Freq: Every day | ORAL | Status: DC | PRN
  Administered 2023-05-24: 30 mL via ORAL
  Filled 2023-05-22: qty 30

## 2023-05-22 MED ORDER — DIPHENHYDRAMINE HCL 50 MG/ML IJ SOLN: 50.0000 mg | Freq: Three times a day (TID) | INTRAMUSCULAR | Status: DC | PRN

## 2023-05-22 MED ORDER — LIDOCAINE 5 % EX PTCH
1.0000 | MEDICATED_PATCH | CUTANEOUS | Status: DC
  Administered 2023-05-22 – 2023-05-26 (×6): 1 via TRANSDERMAL
  Filled 2023-05-22 (×11): qty 1

## 2023-05-22 MED ORDER — HALOPERIDOL LACTATE 5 MG/ML IJ SOLN: 5.0000 mg | Freq: Three times a day (TID) | INTRAMUSCULAR | Status: DC | PRN

## 2023-05-22 NOTE — Group Note (Signed)
 pDate:  05/22/2023 Time:  9:21 PM  Group Topic/Focus:  Wrap-Up Group:   The focus of this group is to help patients review their daily goal of treatment and discuss progress on daily workbooks.    Participation Level:  Active  Participation Quality:  Appropriate and Attentive  Affect:  Appropriate  Cognitive:  Alert and Appropriate  Insight: Appropriate and Good  Engagement in Group:  Engaged  Modes of Intervention:  Discussion and Education  Additional Comments:  Pt attended and participated in wrap up group this evening and rated their day a 1/10. Pt stated that they are experiencing pain related to their recent seizures. Pt is not happy about being here at [facility] but is hoping that will change.   Kathryn [name] 05/22/2023, 9:21 PM

## 2023-05-22 NOTE — ED Notes (Signed)
Pt being tts at this time

## 2023-05-22 NOTE — Progress Notes (Signed)
 Pt has been accepted to [facility] on 05/22/2023 Bed assignment: 403-1  Pt meets inpatient criteria per: Elveria Batter NP  Attending Physician will be: Dr. Levada Pillar, MD   Report can be called to: Adult unit: [phone]  Pt can arrive after DISCHARGES   Care Team Notified: [facility] Cherylynn Ernst RN, Shanda Challenger RN, Huel Mall RN, Elveria Batter NP, Leeroy Collum RN  [place] Mehak Roskelley LCSW-A   05/22/2023 10:51 AM

## 2023-05-22 NOTE — Progress Notes (Signed)
 Pt admitted to unit, oriented to unit, milieu and admission education provided, pt verbalized understanding. Denies any needs at this time.   05/22/23 1730  Psych Admission Type (Psych Patients Only)  Admission Status Involuntary  Psychosocial Assessment  Patient Complaints Depression  Eye Contact Fair  Facial Expression Animated  Affect Anxious  Speech Logical/coherent  Interaction Assertive  Motor Activity Slow  Appearance/Hygiene In scrubs  Behavior Characteristics Appropriate to situation;Cooperative  Mood Anxious  Thought Process  Coherency WDL  Content WDL  Delusions None reported or observed  Perception WDL  Hallucination Auditory  Judgment Limited  Confusion None  Danger to Self  Current suicidal ideation? Denies  Agreement Not to Harm Self Yes  Description of Agreement Verbal  Danger to Others  Danger to Others None reported or observed
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
## 2023-05-22 NOTE — ED Notes (Signed)
Paperwork faxed to [facility] @ [phone].

## 2023-05-22 NOTE — Plan of Care (Signed)
  Problem: Education: Goal: Knowledge of [facility] Education information/materials will improve Outcome: Completed/Met Goal: Emotional status will improve Outcome: Progressing Goal: Mental status will improve Outcome: Progressing Goal: Verbalization of understanding the information provided will improve Outcome: Completed/Met   Problem: Activity: Goal: Interest or engagement in activities will improve Outcome: Progressing Goal: Sleeping patterns will improve Outcome: Progressing   Problem: Coping: Goal: Ability to verbalize frustrations and anger appropriately will improve Outcome: Progressing Goal: Ability to demonstrate self-control will improve Outcome: Progressing

## 2023-05-22 NOTE — ED Notes (Signed)
Faxed IVC paperwork to Magistrate Office 

## 2023-05-22 NOTE — ED Provider Notes (Signed)
IVC paperwork completed and given to Diplomatic Services operational officer.    Rondel Baton, MD 05/22/23 1400

## 2023-05-22 NOTE — ED Provider Notes (Signed)
 Emergency Medicine Observation Re-evaluation Note  Kathryn [name] is a 53 y.o. female, seen on rounds today.  Pt initially presented to the ED for complaints of No chief complaint on file. Currently, the patient is not having any acute complaints.  Physical Exam  BP 139/73 (BP Location: Right Arm)   Pulse 68   Temp 98.6 F (37 C) (Oral)   Resp 20   Wt 76.2 kg   LMP 06/07/2020 Comment: CT showed evidence of uterus and cervix  SpO2 99%   BMI 33.93 kg/m  Physical Exam General: Resting comfortably in stretcher Lungs: Normal work of breathing Psych: Calm  ED Course / MDM  EKG:   I have reviewed the labs performed to date as well as medications administered while in observation.  Recent changes in the last 24 hours include seen by psychiatry is recommending inpatient psychiatric treatment.  Plan  Current plan is for placement.    Kathryn Lamar BROCKS, MD 05/22/23 [phone]

## 2023-05-23 ENCOUNTER — Encounter (HOSPITAL_COMMUNITY): Payer: Self-pay

## 2023-05-23 DIAGNOSIS — F333 Major depressive disorder, recurrent, severe with psychotic symptoms: Secondary | ICD-10-CM | POA: Diagnosis present

## 2023-05-23 DIAGNOSIS — F322 Major depressive disorder, single episode, severe without psychotic features: Secondary | ICD-10-CM

## 2023-05-23 DIAGNOSIS — J449 Chronic obstructive pulmonary disease, unspecified: Secondary | ICD-10-CM | POA: Diagnosis present

## 2023-05-23 DIAGNOSIS — F1420 Cocaine dependence, uncomplicated: Secondary | ICD-10-CM | POA: Diagnosis present

## 2023-05-23 DIAGNOSIS — F1010 Alcohol abuse, uncomplicated: Secondary | ICD-10-CM | POA: Diagnosis present

## 2023-05-23 MED ORDER — AMLODIPINE BESYLATE 5 MG PO TABS
5.0000 mg | ORAL_TABLET | Freq: Every day | ORAL | Status: DC
Start: 2023-05-23 — End: 2023-05-29
  Administered 2023-05-23 – 2023-05-29 (×7): 5 mg via ORAL
  Filled 2023-05-23 (×10): qty 1

## 2023-05-23 MED ORDER — FLUOXETINE HCL 20 MG PO CAPS
20.0000 mg | ORAL_CAPSULE | Freq: Every day | ORAL | Status: DC
Start: 2023-05-26 — End: 2023-05-26
  Administered 2023-05-26: 20 mg via ORAL
  Filled 2023-05-23 (×3): qty 1

## 2023-05-23 MED ORDER — ADULT MULTIVITAMIN W/MINERALS CH
1.0000 | ORAL_TABLET | Freq: Every day | ORAL | Status: DC
  Administered 2023-05-23 – 2023-05-29 (×7): 1 via ORAL
  Filled 2023-05-23 (×10): qty 1

## 2023-05-23 MED ORDER — LORAZEPAM 1 MG PO TABS: 1.0000 mg | ORAL_TABLET | Freq: Four times a day (QID) | ORAL | Status: AC | PRN

## 2023-05-23 MED ORDER — ONDANSETRON 4 MG PO TBDP: 4.0000 mg | ORAL_TABLET | Freq: Four times a day (QID) | ORAL | Status: AC | PRN

## 2023-05-23 MED ORDER — VITAMIN B-1 100 MG PO TABS
100.0000 mg | ORAL_TABLET | Freq: Every day | ORAL | Status: DC
  Administered 2023-05-24 – 2023-05-29 (×6): 100 mg via ORAL
  Filled 2023-05-23 (×9): qty 1

## 2023-05-23 MED ORDER — LOPERAMIDE HCL 2 MG PO CAPS: 2.0000 mg | ORAL_CAPSULE | ORAL | Status: AC | PRN

## 2023-05-23 MED ORDER — QUETIAPINE FUMARATE 25 MG PO TABS
25.0000 mg | ORAL_TABLET | Freq: Every morning | ORAL | Status: DC
  Administered 2023-05-23 – 2023-05-26 (×4): 25 mg via ORAL
  Filled 2023-05-23 (×7): qty 1

## 2023-05-23 MED ORDER — FLUOXETINE HCL 10 MG PO CAPS
10.0000 mg | ORAL_CAPSULE | Freq: Every day | ORAL | Status: AC
  Administered 2023-05-24 – 2023-05-25 (×2): 10 mg via ORAL
  Filled 2023-05-23 (×3): qty 1

## 2023-05-23 MED ORDER — ALBUTEROL SULFATE HFA 108 (90 BASE) MCG/ACT IN AERS
2.0000 | INHALATION_SPRAY | Freq: Four times a day (QID) | RESPIRATORY_TRACT | Status: DC | PRN
  Administered 2023-05-25 – 2023-05-28 (×2): 2 via RESPIRATORY_TRACT
  Filled 2023-05-23: qty 6.7

## 2023-05-23 MED ORDER — APIXABAN 5 MG PO TABS
5.0000 mg | ORAL_TABLET | Freq: Two times a day (BID) | ORAL | Status: DC
Start: 2023-05-23 — End: 2023-05-29
  Administered 2023-05-23 – 2023-05-29 (×13): 5 mg via ORAL
  Filled 2023-05-23 (×17): qty 1

## 2023-05-23 MED ORDER — THIAMINE HCL 100 MG/ML IJ SOLN
100.0000 mg | Freq: Once | INTRAMUSCULAR | Status: AC
  Administered 2023-05-23: 100 mg via INTRAMUSCULAR
  Filled 2023-05-23: qty 2

## 2023-05-23 NOTE — Progress Notes (Signed)
   05/23/23 0100  Psych Admission Type (Psych Patients Only)  Admission Status Involuntary  Psychosocial Assessment  Patient Complaints Anxiety;Depression (Anxiety 10/10, Depression 10/10)  Eye Contact Fair  Facial Expression Flat  Affect Anxious  Speech Slow  Interaction Assertive  Motor Activity Slow  Appearance/Hygiene In scrubs  Behavior Characteristics Cooperative;Appropriate to situation  Mood Depressed;Anxious  Thought Process  Coherency Circumstantial  Content WDL  Delusions None reported or observed  Perception Hallucinations  Hallucination Auditory  Judgment Limited  Confusion None  Danger to Self  Current suicidal ideation? Denies  Agreement Not to Harm Self Yes  Description of Agreement Verbal  Danger to Others  Danger to Others None reported or observed  Danger to Others Abnormal  Harmful Behavior to others No threats or harm toward other people  Destructive Behavior No threats or harm toward property

## 2023-05-23 NOTE — Progress Notes (Signed)
   05/23/23 0100  Psych Admission Type (Psych Patients Only)  Admission Status Involuntary  Psychosocial Assessment  Patient Complaints Anxiety;Depression (Anxiety 10/10, Depression 10/10)  Eye Contact Fair  Facial Expression Flat  Affect Anxious  Speech Slow  Interaction Assertive  Motor Activity Slow  Appearance/Hygiene In scrubs  Behavior Characteristics Cooperative;Appropriate to situation  Mood Depressed;Anxious  Thought Process  Coherency Circumstantial  Content WDL  Delusions None reported or observed  Perception Hallucinations  Hallucination Auditory  Judgment Limited  Confusion None  Danger to Self  Current suicidal ideation? Denies  Self-Injurious Behavior No self-injurious ideation or behavior indicators observed or expressed   Agreement Not to Harm Self Yes  Description of Agreement  (Verbal)  Danger to Others  Danger to Others None reported or observed  Danger to Others Abnormal  Harmful Behavior to others No threats or harm toward other people  Destructive Behavior No threats or harm toward property

## 2023-05-23 NOTE — Group Note (Signed)
 Date:  05/23/2023 Time:  5:26 PM  Group Topic/Focus:  Self Care:   The focus of this group is to help patients understand the importance of self-care in order to improve or restore emotional, physical, spiritual, interpersonal, and financial health.    Participation Level:  Active  Participation Quality:  Appropriate   Carlyon CHRISTELLA Blunt 05/23/2023, 5:26 PM
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
## 2023-05-23 NOTE — [facility] Group Notes (Signed)

## 2023-05-23 NOTE — BH IP Treatment Plan (Signed)
 Interdisciplinary Treatment and Diagnostic Plan Update  05/23/2023 Time of Session: 10:50AM Kathryn [name] MRN: [id number]  Principal Diagnosis: MDD (major depressive disorder), severe (HCC)  Secondary Diagnoses: Principal Problem:   MDD (major depressive disorder), severe (HCC)   Current Medications:  Current Facility-Administered Medications  Medication Dose Route Frequency Provider Last Rate Last Admin   alum & mag hydroxide-simeth (MAALOX/MYLANTA) 200-200-20 MG/5ML suspension 30 mL  30 mL Oral Q4H PRN Coleman, Carolyn H, NP       amLODipine  (NORVASC ) tablet 5 mg  5 mg Oral Daily Nkwenti, Doris, NP       apixaban  (ELIQUIS ) tablet 5 mg  5 mg Oral BID Nkwenti, Doris, NP   5 mg at 05/23/23 1100   haloperidol  (HALDOL ) tablet 5 mg  5 mg Oral TID PRN Mardy Elveria DEL, NP       And   diphenhydrAMINE  (BENADRYL ) capsule 50 mg  50 mg Oral TID PRN Mardy Elveria DEL, NP       haloperidol  lactate (HALDOL ) injection 5 mg  5 mg Intramuscular TID PRN Mardy Elveria DEL, NP       And   diphenhydrAMINE  (BENADRYL ) injection 50 mg  50 mg Intramuscular TID PRN Mardy Elveria DEL, NP       And   LORazepam  (ATIVAN ) injection 2 mg  2 mg Intramuscular TID PRN Coleman, Carolyn H, NP       haloperidol  lactate (HALDOL ) injection 10 mg  10 mg Intramuscular TID PRN Mardy Elveria DEL, NP       And   diphenhydrAMINE  (BENADRYL ) injection 50 mg  50 mg Intramuscular TID PRN Mardy Elveria DEL, NP       And   LORazepam  (ATIVAN ) injection 2 mg  2 mg Intramuscular TID PRN Coleman, Carolyn H, NP       hydrOXYzine  (ATARAX ) tablet 25 mg  25 mg Oral TID PRN Coleman, Carolyn H, NP   25 mg at 05/22/23 2128   lidocaine  (LIDODERM ) 5 % 1 patch  1 patch Transdermal Q24H Onuoha, Chinwendu V, NP   1 patch at 05/22/23 2129   loperamide  (IMODIUM ) capsule 2-4 mg  2-4 mg Oral PRN Nkwenti, Doris, NP       LORazepam  (ATIVAN ) tablet 1 mg  1 mg Oral Q6H PRN Tex Drilling, NP       magnesium  hydroxide (MILK OF MAGNESIA) suspension  30 mL  30 mL Oral Daily PRN Mardy Elveria DEL, NP       multivitamin with minerals tablet 1 tablet  1 tablet Oral Daily Nkwenti, Doris, NP       ondansetron  (ZOFRAN -ODT) disintegrating tablet 4 mg  4 mg Oral Q6H PRN Tex Drilling, NP       pregabalin  (LYRICA ) capsule 75 mg  75 mg Oral BID Onuoha, Chinwendu V, NP   75 mg at 05/23/23 0804   QUEtiapine  (SEROQUEL ) tablet 100 mg  100 mg Oral QHS Coleman, Carolyn H, NP   100 mg at 05/22/23 2129   QUEtiapine  (SEROQUEL ) tablet 25 mg  25 mg Oral q AM Tex Drilling, NP   25 mg at 05/23/23 1205   [START ON 05/24/2023] thiamine  (Vitamin B-1) tablet 100 mg  100 mg Oral Daily Nkwenti, Doris, NP       thiamine  (VITAMIN B1) injection 100 mg  100 mg Intramuscular Once Nkwenti, Doris, NP       PTA Medications: Medications Prior to Admission  Medication Sig Dispense Refill Last Dose/Taking   albuterol  (VENTOLIN  HFA) 108 (90 Base) MCG/ACT inhaler Inhale  2 puffs into the lungs every 6 (six) hours as needed for wheezing or shortness of breath.      amLODipine  (NORVASC ) 5 MG tablet Take 1 tablet (5 mg total) by mouth daily for 10 days. (Patient not taking: Reported on 05/22/2023) 10 tablet 0    apixaban  (ELIQUIS ) 5 MG TABS tablet Take 1 tablet (5 mg total) by mouth 2 (two) times daily. 60 tablet 0    atorvastatin  (LIPITOR) 40 MG tablet Take 1 tablet (40 mg total) by mouth daily. 30 tablet 0    BREO ELLIPTA  200-25 MCG/ACT AEPB Inhale 2 puffs into the lungs 2 (two) times daily.      citalopram  (CELEXA ) 20 MG tablet Take 20 mg by mouth daily.      cyanocobalamin  (VITAMIN B12) 1000 MCG tablet Take 1 tablet (1,000 mcg total) by mouth daily. 30 tablet 0    docusate sodium  (COLACE) 100 MG capsule Take 1 capsule (100 mg total) by mouth 2 (two) times daily. 10 capsule 0    fluticasone  furoate-vilanterol (BREO ELLIPTA ) 200-25 MCG/ACT AEPB Inhale 1 puff into the lungs daily. 180 each 10    folic acid  (FOLVITE ) 1 MG tablet Take 1 tablet (1 mg total) by mouth daily. 30 tablet 0     hydrochlorothiazide  (HYDRODIURIL ) 25 MG tablet Take 25 mg by mouth daily.      losartan  (COZAAR ) 25 MG tablet Take 12.5 mg by mouth daily.      Multiple Vitamin (MULTIVITAMIN WITH MINERALS) TABS tablet Take 1 tablet by mouth daily. 30 tablet 0    polyethylene glycol powder (GLYCOLAX /MIRALAX ) 17 GM/SCOOP powder Take 1 capful (17 g) by mouth daily. 238 g 0    pregabalin  (LYRICA ) 200 MG capsule Take 200 mg by mouth in the morning and at bedtime.      QUEtiapine  (SEROQUEL ) 300 MG tablet Take 300 mg by mouth at bedtime.       Patient Stressors:    Patient Strengths:    Treatment Modalities: Medication Management, Group therapy, Case management,  1 to 1 session with clinician, Psychoeducation, Recreational therapy.   Physician Treatment Plan for Primary Diagnosis: MDD (major depressive disorder), severe (HCC) Long Term Goal(s):     Short Term Goals:    Medication Management: Evaluate patient's response, side effects, and tolerance of medication regimen.  Therapeutic Interventions: 1 to 1 sessions, Unit Group sessions and Medication administration.  Evaluation of Outcomes: Not Progressing  Physician Treatment Plan for Secondary Diagnosis: Principal Problem:   MDD (major depressive disorder), severe (HCC)  Long Term Goal(s):     Short Term Goals:       Medication Management: Evaluate patient's response, side effects, and tolerance of medication regimen.  Therapeutic Interventions: 1 to 1 sessions, Unit Group sessions and Medication administration.  Evaluation of Outcomes: Not Progressing   RN Treatment Plan for Primary Diagnosis: MDD (major depressive disorder), severe (HCC) Long Term Goal(s): Knowledge of disease and therapeutic regimen to maintain health will improve  Short Term Goals: Ability to remain free from injury will improve, Ability to verbalize frustration and anger appropriately will improve, Ability to demonstrate self-control, Ability to participate in decision  making will improve, Ability to verbalize feelings will improve, Ability to disclose and discuss suicidal ideas, Ability to identify and develop effective coping behaviors will improve, and Compliance with prescribed medications will improve  Medication Management: RN will administer medications as ordered by provider, will assess and evaluate patient's response and provide education to patient for prescribed medication. RN will report  any adverse and/or side effects to prescribing provider.  Therapeutic Interventions: 1 on 1 counseling sessions, Psychoeducation, Medication administration, Evaluate responses to treatment, Monitor vital signs and CBGs as ordered, Perform/monitor CIWA, COWS, AIMS and Fall Risk screenings as ordered, Perform wound care treatments as ordered.  Evaluation of Outcomes: Not Progressing   LCSW Treatment Plan for Primary Diagnosis: MDD (major depressive disorder), severe (HCC) Long Term Goal(s): Safe transition to appropriate next level of care at discharge, Engage patient in therapeutic group addressing interpersonal concerns.  Short Term Goals: Engage patient in aftercare planning with referrals and resources, Increase social support, Increase ability to appropriately verbalize feelings, Increase emotional regulation, Facilitate acceptance of mental health diagnosis and concerns, Facilitate patient progression through stages of change regarding substance use diagnoses and concerns, Identify triggers associated with mental health/substance abuse issues, and Increase skills for wellness and recovery  Therapeutic Interventions: Assess for all discharge needs, 1 to 1 time with Social worker, Explore available resources and support systems, Assess for adequacy in community support network, Educate family and significant other(s) on suicide prevention, Complete Psychosocial Assessment, Interpersonal group therapy.  Evaluation of Outcomes: Not Progressing   Progress in  Treatment: Attending groups: Yes. Participating in groups: Yes. Taking medication as prescribed: Yes. Toleration medication: Yes. Family/Significant other contact made: No, will contact:  consents pending Patient understands diagnosis: Yes. Discussing patient identified problems/goals with staff: Yes. Medical problems stabilized or resolved: Yes. Denies suicidal/homicidal ideation: Yes. Issues/concerns per patient self-inventory: No.  New problem(s) identified: No, Describe:  pt requested [employer] and [employer] customer service number to cancel cards  New Short Term/Long Term Goal(s): detox, medication management for mood stabilization; elimination of SI thoughts; development of comprehensive mental wellness/sobriety plan   Patient Goals:  I need to stabilize my moods and get on my medications. And go to treatment for cocaine  Discharge Plan or Barriers: Patient recently admitted. CSW will continue to follow and assess for appropriate referrals and possible discharge planning.    Reason for Continuation of Hospitalization: Anxiety Depression Medication stabilization  Estimated Length of Stay: 5-7 days  Last 3 [place] Suicide Severity Risk Score: Flowsheet Row Ad[facility] (Current) [facility] from 05/21/2023 in [facility] at [facility] ED from 05/20/2023 in [facility] at [facility]  C-SSRS RISK CATEGORY Low Risk No Risk No Risk       Last [facility] 2/9 Scores:    02/02/2022    9:20 AM 12/20/2021   11:26 AM 11/03/2021    9:10 AM  Depression screen PHQ 2/9  Decreased Interest 0 3 0  Down, Depressed, Hopeless 0 3 0  PHQ - 2 Score 0 6 0  Altered sleeping 0 3   Tired, decreased energy 0 3   Change in appetite 0 3   Feeling bad or failure about yourself  0 3   Trouble concentrating 0 3   Moving slowly or fidgety/restless 0 0   Suicidal thoughts 0 3   PHQ-9 Score 0 24    Difficult doing work/chores Not difficult at all Very difficult     Scribe for Treatment Team: [name], LCSWA 05/23/2023 12:46 PM

## 2023-05-23 NOTE — Plan of Care (Signed)
   Problem: Education: Goal: Emotional status will improve Outcome: Progressing Goal: Mental status will improve Outcome: Progressing   Problem: Activity: Goal: Interest or engagement in activities will improve Outcome: Progressing Goal: Sleeping patterns will improve Outcome: Progressing   Problem: Safety: Goal: Periods of time without injury will increase Outcome: Progressing

## 2023-05-23 NOTE — Progress Notes (Signed)
   05/23/23 1000  Psych Admission Type (Psych Patients Only)  Admission Status Involuntary  Psychosocial Assessment  Patient Complaints Anxiety;Depression  Eye Contact Fair  Facial Expression Animated  Affect Anxious  Speech Logical/coherent  Interaction Assertive  Motor Activity Slow  Appearance/Hygiene Unremarkable  Behavior Characteristics Cooperative  Mood Depressed;Anxious  Thought Process  Coherency Circumstantial  Content WDL  Delusions None reported or observed  Perception Hallucinations  Hallucination Auditory  Judgment Limited  Confusion None  Danger to Self  Current suicidal ideation? Passive  Agreement Not to Harm Self Yes  Danger to Others  Danger to Others None reported or observed

## 2023-05-23 NOTE — [facility] Suicide Risk Assessment (Signed)
 Suicide Risk Assessment  Admission Assessment    [facility] Admission Suicide Risk Assessment   Nursing information obtained from:    Demographic factors:  Low socioeconomic status, Unemployed Current Mental Status:  Suicidal ideation indicated by patient Loss Factors:  Financial problems / change in socioeconomic status, Decline in physical health Historical Factors:  Prior suicide attempts Risk Reduction Factors:  NA  Total Time spent with patient: 1.5 hours Principal Problem: MDD (major depressive disorder), severe (HCC) Diagnosis:  Active Problems:   Hypertension   History of thrombosis   COPD (chronic obstructive pulmonary disease) (HCC)   Cocaine use disorder, severe, dependence (HCC)   Alcohol use disorder, mild, abuse   MDD (major depressive disorder), recurrent, severe, with psychosis (HCC)  Subjective Data: SI, substance use & depressive symptoms  Continued Clinical Symptoms:  depressed mood, anhedonia, insomnia, psychomotor retardation, fatigue, feelings of worthlessness/guilt, difficulty concentrating, hopelessness, suicidal thoughts with specific plan, anxiety, panic attacks, loss of energy/fatigue, disturbed sleep, decreased appetite, (Hypo) Manic Symptoms:  Distractibility, Impulsivity, Anxiety Symptoms:  Excessive Worry, Panic Symptoms, Psychotic Symptoms:  Hallucinations: Auditory Visual PTSD Symptoms: Re-experiencing:  Flashbacks Intrusive Thoughts Nightmares Hypervigilance:  Yes Hyperarousal:  Difficulty Concentrating Emotional Numbness/Detachment Increased Startle Response Sleep Avoidance:  Decreased Interest/Participation Total Time spent with patient: 1.5 hours  Alcohol Use Disorder Identification Test Final Score (AUDIT): 9 The Alcohol Use Disorders Identification Test, Guidelines for Use in Primary Care, Second Edition.  World Science Writer [facility]). Score between 0-7:  no or low risk or alcohol related problems. Score between 8-15:   moderate risk of alcohol related problems. Score between 16-19:  high risk of alcohol related problems. Score 20 or above:  warrants further diagnostic evaluation for alcohol dependence and treatment.  CLINICAL FACTORS:   Panic Attacks Depression:   Anhedonia Hopelessness Insomnia Severe Alcohol/Substance Abuse/Dependencies More than one psychiatric diagnosis Unstable or Poor Therapeutic Relationship Previous Psychiatric Diagnoses and Treatments Medical Diagnoses and Treatments/Surgeries  Musculoskeletal: Strength & Muscle Tone: within normal limits Gait & Station: normal Patient leans: N/A  Psychiatric Specialty Exam:  Presentation  General Appearance:  Fairly Groomed  Eye Contact: Fair  Speech: Clear and Coherent  Speech Volume: Normal  Handedness: Right  Mood and Affect  Mood: Depressed; Anxious  Affect: Congruent; Depressed  Thought Process  Thought Processes: Coherent  Descriptions of Associations:Intact  Orientation:Full (Time, Place and Person)  Thought Content:Logical  History of Schizophrenia/Schizoaffective disorder:No  Duration of Psychotic Symptoms:Less than six months  Hallucinations:Hallucinations: Auditory; Visual  Ideas of Reference:Paranoia  Suicidal Thoughts:Suicidal Thoughts: Yes, Active SI Active Intent and/or Plan: Without Intent SI Passive Intent and/or Plan: Without Intent  Homicidal Thoughts:Homicidal Thoughts: No  Sensorium  Memory: Immediate Fair  Judgment: Poor  Insight: Poor  Executive Functions  Concentration: Fair  Attention Span: Fair  Recall: [employer] of Knowledge: Fair  Language: Fair  Psychomotor Activity  Psychomotor Activity: Psychomotor Activity: Normal  Assets  Assets: Resilience  Sleep  Sleep: Sleep: Poor  Physical Exam: Physical Exam Constitutional:      Appearance: Normal appearance.  Musculoskeletal:     Cervical back: Normal range of motion.  Neurological:      General: No focal deficit present.     Mental Status: She is alert and oriented to person, place, and time.    Review of Systems  Psychiatric/Behavioral:  Positive for depression, hallucinations, substance abuse and suicidal ideas. Negative for memory loss. The patient is nervous/anxious and has insomnia.   All other systems reviewed and are negative.  Blood pressure  122/71, pulse 92, temperature 98.2 F (36.8 C), temperature source Oral, resp. rate 16, height 4' 11 (1.499 m), weight 78.8 kg, last menstrual period 06/07/2020, SpO2 97%. Body mass index is 35.1 kg/m.  COGNITIVE FEATURES THAT CONTRIBUTE TO RISK:  None    SUICIDE RISK:   Severe:  Frequent, intense, and enduring suicidal ideation, specific plan, no subjective intent, but some objective markers of intent (i.e., choice of lethal method), the method is accessible, some limited preparatory behavior, evidence of impaired self-control, severe dysphoria/symptomatology, multiple risk factors present, and few if any protective factors, particularly a lack of social support.  PLAN OF CARE: See H & P  I certify that inpatient services furnished can reasonably be expected to improve the patient's condition.   Donia Snell, NP 05/23/2023, 4:22 PM

## 2023-05-23 NOTE — Group Note (Signed)
 Recreation Therapy Group Note   Group Topic:Team Building  Group Date: 05/23/2023 Start Time: 9062 End Time: 0959 Facilitators: Kathryn [name], Kathryn [name],Kathryn [name] Location: [address] Dayroom   Group Topic: Communication, Team Building, Problem Solving  Goal Area(s) Addresses:  Patient will effectively work with peer towards shared goal.  Patient will identify skills used to make activity successful.  Patient will identify how skills used during activity can be applied to reach post d/c goals.   Intervention: STEM Activity- Glass Blower/designer  Activity: Tallest [employer]. In teams of 5-6, patients were given 11 craft pipe cleaners. Using the materials provided, patients were instructed to compete again the opposing team(s) to build the tallest free-standing structure from floor level. The activity was timed; difficulty increased by clinical research associate as production designer, theatre/television/film continued.  Systematically resources were removed with additional directions for example, placing one arm behind their back, working in silence, and shape stipulations. Kathryn [name] facilitated post-activity discussion reviewing team processes and necessary communication skills involved in completion. Patients were encouraged to reflect how the skills utilized, or not utilized, in this activity can be incorporated to positively impact support systems post discharge.  Education: Pharmacist, Community, Scientist, Physiological, Discharge Planning   Education Outcome: Acknowledges education/In group clarification offered/Needs additional education.    Affect/Mood: Appropriate   Participation Level: Engaged   Participation Quality: Independent   Behavior: Appropriate   Speech/Thought Process: Focused   Insight: Good   Judgement: Good   Modes of Intervention: Team-building   Patient Response to Interventions:  Engaged   Education Outcome:  In group clarification offered    Clinical Observations/Individualized Feedback: Pt came in late to  group and joined right in with the activity. Pt made suggestions and followed along as peers made suggestions as well. Pt was bright, supportive and engaged throughout group.    Plan: Continue to engage patient in RT group sessions 2-3x/week.   Kathryn [name], Kathryn [name],Kathryn [name] 05/23/2023 1:26 PM

## 2023-05-23 NOTE — Group Note (Signed)
 Date:  05/23/2023 Time:  9:04 AM  Group Topic/Focus:  Goals Group:   The focus of this group is to help patients establish daily goals to achieve during treatment and discuss how the patient can incorporate goal setting into their daily lives to aide in recovery. Orientation:   The focus of this group is to educate the patient on the purpose and policies of crisis stabilization and provide a format to answer questions about their admission.  The group details unit policies and expectations of patients while admitted.    Participation Level:  Active  Participation Quality:  Appropriate      Kathryn [name] 05/23/2023, 9:04 AM

## 2023-05-23 NOTE — H&P (Addendum)
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 develop effective coping behaviors will improve, Ability to maintain clinical measurements within normal limits will improve, Compliance with prescribed medications will improve, and Ability to identify triggers associated with substance abuse/mental health issues will improve  Diagnoses Active Problems:   Hypertension   History of thrombosis   COPD (chronic obstructive pulmonary disease) (HCC)   Cocaine use disorder, severe, dependence (HCC)   Alcohol use disorder, mild, abuse   MDD (major depressive disorder), recurrent, severe, with psychosis (HCC)  Medications -Discontinue Celexa  as patient states that this medication is ineffective -Start Prozac  10 mg today and increase to 20 mg after 2 doses -Start hydroxyzine  25 mg 3 times daily as needed for anxiety -Continue Seroquel  100 mg nightly for sleep and mood stabilization -Start Seroquel  25 mg daily in the mornings for anxiety -Start Ativan   1 mg Q 6 H PRN for CIWAs >10 -Continue Agitation Protocol medications-See the MAR for details: Ativan /Haldol /Benadryl  PRN   Meds for medical reasons: -Continue Eliquis  5 mg twice daily for thrombosis prophylaxis -Continue Norvasc  5 mg daily for hypertension -Continue Lyrica  75 mg twice daily for neuropathy -Continue albuterol  every 6 hours as needed for wheezing/SOB  PRNS -Continue Tylenol  650 mg every 6 hours PRN for mild pain -Continue Maalox 30 mg every 4 hrs PRN for indigestion -Continue Milk of Magnesia as needed every 6 hrs for constipation  Labs reviewed: Lipid panel on 01/20, hemoglobin A1c on 01/20.  TSH 2 weeks ago was 0.118, T4 was within normal limits, we will repeat free T3 and free T4, along with vitamin D , and BUN.  QTc 479 from EKG of 2/2.  We will need to repeat in a day or 2.  Discharge Planning: Social work and case management to assist with discharge planning and identification of hospital follow-up needs prior to discharge Estimated LOS: 5-7 days Discharge Concerns: Need to establish a safety plan; Medication compliance and effectiveness Discharge Goals: Return home with outpatient referrals for mental health follow-up including medication management/psychotherapy  I certify that inpatient services furnished can reasonably be expected to improve the patient's condition.    Donia Snell, NP 2/5/20254:19 PM

## 2023-05-23 NOTE — Plan of Care (Signed)
  Problem: Education: Goal: Emotional status will improve Outcome: Progressing Goal: Mental status will improve Outcome: Progressing   Problem: Activity: Goal: Interest or engagement in activities will improve Outcome: Progressing Goal: Sleeping patterns will improve Outcome: Progressing   Problem: Coping: Goal: Ability to verbalize frustrations and anger appropriately will improve Outcome: Progressing Goal: Ability to demonstrate self-control will improve Outcome: Progressing   Problem: Health Behavior/Discharge Planning: Goal: Identification of resources available to assist in meeting health care needs will improve Outcome: Progressing Goal: Compliance with treatment plan for underlying cause of condition will improve Outcome: Progressing   Problem: Physical Regulation: Goal: Ability to maintain clinical measurements within normal limits will improve Outcome: Progressing   Problem: Safety: Goal: Periods of time without injury will increase Outcome: Progressing

## 2023-05-23 NOTE — [facility] Group Notes (Signed)
 [facility] Group Notes:  (Nursing/MHT/Case Management/Adjunct)  Date:  05/23/2023  Time:  11:01 PM  Type of Therapy:  Psychoeducational Skills  Participation Level:  Minimal  Participation Quality:  Inattentive  Affect:  Flat  Cognitive:  Lacking  Insight:  Limited  Engagement in Group:  Limited and Resistant  Modes of Intervention:  Education  Summary of Progress/Problems: The patient rated her day as a 1 out of `10. She would only share with the group that she experienced a bad interaction with her nurse earlier in the day. When asked what was positive about her day, she stated, I'm here.   Shann Merrick S 05/23/2023, 11:01 PM

## 2023-05-24 LAB — VITAMIN D 25 HYDROXY (VIT D DEFICIENCY, FRACTURES): Vit D, 25-Hydroxy: 33.84 ng/mL (ref 30–100)

## 2023-05-24 LAB — VITAMIN B12: Vitamin B-12: 493 pg/mL (ref 180–914)

## 2023-05-24 LAB — TSH: TSH: 1.612 u[IU]/mL (ref 0.350–4.500)

## 2023-05-24 MED ORDER — PANTOPRAZOLE SODIUM 40 MG PO TBEC
40.0000 mg | DELAYED_RELEASE_TABLET | Freq: Every day | ORAL | Status: DC
  Administered 2023-05-24 – 2023-05-29 (×6): 40 mg via ORAL
  Filled 2023-05-24 (×8): qty 1

## 2023-05-24 NOTE — Group Note (Signed)
 LCSW Group Therapy Note  Group Date: 05/24/2023 Start Time: 1100 End Time: 1200  Participation:  patient was present and engaged in the conversation.     Type:  Group Therapy  Topic:  Nurturing Your Mind and Body Through Calm  Objective:  Learn techniques for managing stress through body relaxation, mindfulness, and self-compassion.  Goals: Use body relaxation techniques, such as Box Breathing and Progressive Muscle Relaxation, to reduce physical tension. Practice mindfulness to break the cycle of overthinking and mental chatter. Embrace self-compassion to handle stress with kindness and resilience.  Summary:  Today's session focused on calming the body with relaxation techniques, breaking the cycle of stress with mindfulness, and using self-compassion to manage challenges more gracefully.  These tools help reduce stress and foster a balanced, peaceful mindset.  Therapeutic Modalities:  Elements of CBT (cognitive restructuring)  Elements of DBT (box breathing, progressive body relaxation, mindfulness, acceptance)    Savreen Gebhardt O Yechiel Erny, LCSWA 05/24/2023  1:38 PM

## 2023-05-24 NOTE — Progress Notes (Signed)
 05/24/2023       1:34 PM   Kathryn [name]   Type of Note: Substance Use Treatment  Patient was given the phone numbers for [facility] and [facility] in [place].   Pt called and spoke with Rosaline through [employer] at the [place], [place] location (12 month long, free program). Rosaline then requested a call from this clinical research associate. Spoke with Rosaline who reports that pt could be admitted there next week however has to have phone interview first - there is bed availability. Pt would not be allowed to continue on anxiety or depression medications with this program. MD aware.  Will continue to explore this as an option pending medication decision.  Signed:  Zayn Selley, LCSW-A 05/24/2023  1:34 PM\

## 2023-05-24 NOTE — Plan of Care (Signed)
   Problem: Education: Goal: Emotional status will improve Outcome: Progressing Goal: Mental status will improve Outcome: Progressing   Problem: Coping: Goal: Ability to demonstrate self-control will improve Outcome: Progressing   Problem: Safety: Goal: Periods of time without injury will increase Outcome: Progressing

## 2023-05-24 NOTE — Progress Notes (Addendum)
 D: Patient is alert, oriented, and cooperative. Patient endorses SI with a plan to get a razor from behind the nurses station. Patient endorses AH telling her to do this. Patient verbally contracts for safety. Denies HI and VH. Patient reports she slept good last night with sleeping medication. Patient reports her appetite as fair, energy level as low, and concentration as poor. Patient rates her depression 0/10, hopelessness 0/10, and anxiety 10/10. Patient reports headache, acid reflux, anxiety, nausea, and low back pain rated 10/10.   A: Scheduled medications administered per MD order. PRN maalox and hydroxyzine  administered. Support provided. Patient educated on safety on the unit and medications. Routine safety checks every 15 minutes. Patient stated understanding to tell nurse about any new physical symptoms. Patient understands to tell staff of any needs.     R: No adverse drug reactions noted. Patient verbally contracts for safety. Patient remains safe at this time and will continue to monitor.    05/24/23 1100  Psych Admission Type (Psych Patients Only)  Admission Status Involuntary  Psychosocial Assessment  Patient Complaints Anxiety;Depression  Eye Contact Fair  Facial Expression Animated  Affect Anxious  Speech Logical/coherent  Interaction Assertive  Motor Activity Slow  Appearance/Hygiene Unremarkable  Behavior Characteristics Cooperative;Appropriate to situation  Mood Depressed;Anxious  Thought Process  Coherency Circumstantial  Content WDL  Delusions None reported or observed  Perception Hallucinations  Hallucination Auditory  Judgment Limited  Confusion None  Danger to Self  Current suicidal ideation? Active  Description of Suicide Plan plan to get a razor from behind the desk  Agreement Not to Harm Self Yes  Description of Agreement verbal  Danger to Others  Danger to Others None reported or observed  Danger to Others Abnormal  Harmful Behavior to others No  threats or harm toward other people  Destructive Behavior No threats or harm toward property

## 2023-05-24 NOTE — [facility] Group Notes (Signed)
 [facility] Group Notes:  (Nursing/MHT/Case Management/Adjunct)  Date:  05/24/2023  Time:  9:52 PM  Type of Therapy:   Wrap-up group  Participation Level:  Active  Participation Quality:  Appropriate  Affect:  Appropriate  Cognitive:  Appropriate  Insight:  Appropriate  Engagement in Group:  Engaged  Modes of Intervention:  Education  Summary of Progress/Problems: Pt goal to stay positive. Goal not met. Rated day 1/10 due to having heartburn.   Kathryn [name] 05/24/2023, 9:52 PM

## 2023-05-24 NOTE — Plan of Care (Signed)
  Problem: Education: Goal: Emotional status will improve Outcome: Progressing   Problem: Activity: Goal: Interest or engagement in activities will improve Outcome: Progressing   Problem: Coping: Goal: Ability to verbalize frustrations and anger appropriately will improve Outcome: Progressing

## 2023-05-24 NOTE — Progress Notes (Signed)
 [facility] MD Progress Note  05/24/2023 3:50 PM Kathryn [name]  MRN:  [id number]  Subjective:  53 yo AAF with a prior mental health history of cocaine dependence, MDD, and GAD who presented to the [facility] ER on 2/2 for evaluation of global throbbing headaches, intermittent epistaxis, subjective fevers & chills at home. While being worked up medically, pt reported depressive symptoms secondary to financial stressors & homelessness. She reported being SI with plan to walk in front of a car if she could not obtain housing.   Daily notes: Kathryn [name] is seen. Chart reviewed. The chart findings discussed with the treatment team. She presents alert, oriented & aware of situation. She is visible on the unit, attending group sessions. She is currently ambulating within the unit with the aid of a walker. She reports, Two days ago, I was feeling very stressed & suicidal triggered by stress from my use of cocaine/alcohol. I'm feeling very poor today. I'm hurting. I need my pain patch to my back area. I'm also depressed, but I have started on my depression medicines. I will also need to get back on my ativan . 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 My legs are weak. That is the reason I'm using a walker. I also will need my omeprazole  for my acid reflux. I'm having a lot of withdrawal symptoms from cocaine & alcohol. I'm also feeling like hurting myself, but I feel safe here. I'm hearing voices telling me to pick-up a razor & end in it all. I know I was not going to listen to the voices. [name] currently denies any HI, VH, delusional thoughts or paranoia. She is informed that she is not going to be started on Ativan  here at [facility]. The social worker has reached out to a treatment center in [place]  to see about substance abuse treatment program for patient after discharge. Patient will have a phone interview tomorrow with the staff from this  treatment program. And part of their requirement is that patients are not to be on any psychotropic medications. At this time, the SW hinted that patient is considering staying off all her psychotropic medications so she can be enrolled in this treatment program. We will know more tomorrow after patient's interview tomorrow. Patient is started on Protonix  40 mg for indigestion. Reviewed vital signs, stable. Will continue current plan of care as already in progress.   Principal Problem: MDD (major depressive disorder), severe (HCC)  Diagnosis: Active Problems:   Hypertension   History of thrombosis   COPD (chronic obstructive pulmonary disease) (HCC)   Cocaine use disorder, severe, dependence (HCC)   Alcohol use disorder, mild, abuse   MDD (major depressive disorder), recurrent, severe, with psychosis (HCC)  Total Time spent with patient: 45 minutes  Past Psychiatric History: See H&P.  Past Medical History:  Past Medical History:  Diagnosis Date   Anxiety    Arthritis    Asthma    Chronic back pain    COPD (chronic obstructive pulmonary disease) (HCC)    Chronic bronchitis   Depression    Dyspnea    GERD (gastroesophageal reflux disease)    Hypertension    Migraine    Neuropathy    Osteoarthritis of left knee, 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   Single subsegmental pulmonary embolism without acute cor pulmonale (HCC) 06/20/2021   Sleep apnea 04/2020   GETTING A cpap   Suicidal ideation 12/24/2018  Past Surgical History:  Procedure Laterality Date   BACK SURGERY     DILATATION AND CURETTAGE/HYSTEROSCOPY WITH MINERVA N/A 06/09/2020   Procedure: DILATATION AND CURETTAGE/HYSTEROSCOPY WITH MINERVA;  Surgeon: Jayne Vonn DEL, MD;  Location: [facility];  Service: Gynecology;  Laterality: N/A;   ESOPHAGOGASTRODUODENOSCOPY (EGD) WITH PROPOFOL  N/A 12/25/2018   Procedure: ESOPHAGOGASTRODUODENOSCOPY (EGD) WITH PROPOFOL ;  Surgeon: Golda Claudis PENNER, MD;  Location: [facility];  Service: Endoscopy;   Laterality: N/A;   FLEXIBLE SIGMOIDOSCOPY  10/14/2021   Procedure: FLEXIBLE SIGMOIDOSCOPY;  Surgeon: Eartha Angelia Sieving, MD;  Location: [facility];  Service: Gastroenterology;;   PATELLA-FEMORAL ARTHROPLASTY Left 10/08/2018   Procedure: PATELLA-FEMORAL ARTHROPLASTY;  Surgeon: Josefina Chew, MD;  Location: [facility];  Service: Orthopedics;  Laterality: Left;   TUBAL LIGATION     Family History:  Family History  Problem Relation Age of Onset   Gout Paternal Grandfather    Cirrhosis Paternal Grandfather    Hypertension Paternal Grandmother    Aneurysm Paternal Grandmother    Cirrhosis Maternal Grandmother    Cirrhosis Maternal Grandfather    Cancer Father    Cirrhosis Father    Cirrhosis Mother    Breast cancer Sister    Hypertension Sister    Bronchitis Daughter    Bronchitis Daughter    Asthma Son    Bronchitis Son    Migraines Neg Hx    Family Psychiatric  History: See H&P.  Social History:  Social History   Substance and Sexual Activity  Alcohol Use Not Currently   Comment: states quit  06/29/22     Social History   Substance and Sexual Activity  Drug Use Yes   Types: Cocaine   Comment: last used 05/2022    Social History   Socioeconomic History   Marital status: Single    Spouse name: Not on file   Number of children: 3   Years of education: 10   Highest education level: 10th grade  Occupational History   Not on file  Tobacco Use   Smoking status: Former    Current packs/day: 1.00    Average packs/day: 1 pack/day for 24.2 years (24.2 ttl pk-yrs)    Types: Cigarettes    Start date: 03/26/1999    Passive exposure: Never   Smokeless tobacco: Never   Tobacco comments:    Smokes often, especially when using crack cocaine  Vaping Use   Vaping status: Never Used  Substance and Sexual Activity   Alcohol use: Not Currently    Comment: states quit  06/29/22   Drug use: Yes    Types: Cocaine    Comment: last used 05/2022   Sexual activity: Yes     Birth control/protection: Surgical    Comment: tubal, ablation  Other Topics Concern   Not on file  Social History Narrative   R handed    Lives with boyfriend   1 Cup of caffeine daily    Social Drivers of Health   Financial Resource Strain: Not on file  Food Insecurity: Food Insecurity Present (05/22/2023)   Hunger Vital Sign    Worried About Running Out of Food in the Last Year: Sometimes true    Ran Out of Food in the Last Year: Sometimes true  Transportation Needs: No Transportation Needs (05/22/2023)   PRAPARE - Administrator, Civil Service (Medical): No    Lack of Transportation (Non-Medical): No  Recent Concern: Transportation Needs - Unmet Transportation Needs (03/26/2023)   PRAPARE - Transportation  Lack of Transportation (Medical): Yes    Lack of Transportation (Non-Medical): Yes  Physical Activity: Not on file  Stress: Not on file  Social Connections: Unknown (08/30/2021)   Received from [facility]   Social Network    Social Network: Not on file   Additional Social History:   Sleep: Good  Appetite:  Good  Current Medications: Current Facility-Administered Medications  Medication Dose Route Frequency Provider Last Rate Last Admin   albuterol  (VENTOLIN  HFA) 108 (90 Base) MCG/ACT inhaler 2 puff  2 puff Inhalation Q6H PRN Tex Drilling, NP       alum & mag hydroxide-simeth (MAALOX/MYLANTA) 200-200-20 MG/5ML suspension 30 mL  30 mL Oral Q4H PRN Coleman, Carolyn H, NP   30 mL at 05/24/23 1445   amLODipine  (NORVASC ) tablet 5 mg  5 mg Oral Daily Tex Drilling, NP   5 mg at 05/24/23 1041   apixaban  (ELIQUIS ) tablet 5 mg  5 mg Oral BID Tex Drilling, NP   5 mg at 05/24/23 1041   haloperidol  (HALDOL ) tablet 5 mg  5 mg Oral TID PRN Mardy Elveria DEL, NP       And   diphenhydrAMINE  (BENADRYL ) capsule 50 mg  50 mg Oral TID PRN Mardy Elveria DEL, NP       haloperidol  lactate (HALDOL ) injection 5 mg  5 mg Intramuscular TID PRN Mardy Elveria DEL, NP       And   diphenhydrAMINE  (BENADRYL ) injection 50 mg  50 mg Intramuscular TID PRN Mardy Elveria DEL, NP       And   LORazepam  (ATIVAN ) injection 2 mg  2 mg Intramuscular TID PRN Coleman, Carolyn H, NP       haloperidol  lactate (HALDOL ) injection 10 mg  10 mg Intramuscular TID PRN Mardy Elveria DEL, NP       And   diphenhydrAMINE  (BENADRYL ) injection 50 mg  50 mg Intramuscular TID PRN Mardy Elveria DEL, NP       And   LORazepam  (ATIVAN ) injection 2 mg  2 mg Intramuscular TID PRN Mardy Elveria DEL, NP       FLUoxetine  (PROZAC ) capsule 10 mg  10 mg Oral Daily Nkwenti, Doris, NP   10 mg at 05/24/23 1040   Followed by   NOREEN ON 05/26/2023] FLUoxetine  (PROZAC ) capsule 20 mg  20 mg Oral Daily Nkwenti, Doris, NP       hydrOXYzine  (ATARAX ) tablet 25 mg  25 mg Oral TID PRN Coleman, Carolyn H, NP   25 mg at 05/24/23 1445   lidocaine  (LIDODERM ) 5 % 1 patch  1 patch Transdermal Q24H Onuoha, Chinwendu V, NP   1 patch at 05/24/23 1251   loperamide  (IMODIUM ) capsule 2-4 mg  2-4 mg Oral PRN Nkwenti, Doris, NP       LORazepam  (ATIVAN ) tablet 1 mg  1 mg Oral Q6H PRN Tex Drilling, NP       magnesium  hydroxide (MILK OF MAGNESIA) suspension 30 mL  30 mL Oral Daily PRN Coleman, Carolyn H, NP   30 mL at 05/24/23 1536   multivitamin with minerals tablet 1 tablet  1 tablet Oral Daily Tex Drilling, NP   1 tablet at 05/24/23 1040   ondansetron  (ZOFRAN -ODT) disintegrating tablet 4 mg  4 mg Oral Q6H PRN Tex Drilling, NP       pantoprazole  (PROTONIX ) EC tablet 40 mg  40 mg Oral Daily Paton Crum, Mac I, NP   40 mg at 05/24/23 1212   pregabalin  (LYRICA ) capsule 75 mg  75 mg Oral BID Onuoha, Chinwendu V, NP   75 mg at 05/24/23 1040   QUEtiapine  (SEROQUEL ) tablet 100 mg  100 mg Oral QHS Coleman, Carolyn H, NP   100 mg at 05/23/23 2108   QUEtiapine  (SEROQUEL ) tablet 25 mg  25 mg Oral q AM Tex Drilling, NP   25 mg at 05/24/23 0624   thiamine  (Vitamin B-1) tablet 100 mg  100 mg Oral Daily Nkwenti, Doris, NP   100  mg at 05/24/23 1040    Lab Results: No results found for this or any previous visit (from the past 48 hours).  Blood Alcohol level:  Lab Results  Component Value Date   ETH <10 05/06/2023   ETH <10 03/01/2023    Metabolic Disorder Labs: Lab Results  Component Value Date   HGBA1C 5.7 (H) 05/07/2023   MPG 116.89 05/07/2023   MPG 119.76 03/08/2023   No results found for: PROLACTIN Lab Results  Component Value Date   CHOL 157 05/07/2023   TRIG 104 05/07/2023   HDL 45 05/07/2023   CHOLHDL 3.5 05/07/2023   VLDL 21 05/07/2023   LDLCALC 91 05/07/2023   LDLCALC 73 03/08/2023    Physical Findings: AIMS:  , ,  ,  ,    CIWA:  CIWA-Ar Total: 3 COWS:     Musculoskeletal: Strength & Muscle Tone: within normal limits Gait & Station:  Currently using walker to aid her ambulation/balance. Patient leans: N/A  Psychiatric Specialty Exam:  Presentation  General Appearance:  Fairly Groomed  Eye Contact: Fair  Speech: Clear and Coherent  Speech Volume: Normal  Handedness: Right   Mood and Affect  Mood: Depressed; Anxious  Affect: Congruent; Depressed   Thought Process  Thought Processes: Coherent  Descriptions of Associations:Intact  Orientation:Full (Time, Place and Person)  Thought Content:Logical  History of Schizophrenia/Schizoaffective disorder:No  Duration of Psychotic Symptoms:Less than six months  Hallucinations:Hallucinations: Auditory; Visual  Ideas of Reference:Paranoia  Suicidal Thoughts:Suicidal Thoughts: Yes, Active SI Active Intent and/or Plan: Without Intent SI Passive Intent and/or Plan: Without Intent  Homicidal Thoughts:Homicidal Thoughts: No   Sensorium  Memory: Immediate Fair  Judgment: Poor  Insight: Poor   Executive Functions  Concentration: Fair  Attention Span: Fair  Recall: [employer] of Knowledge: Fair  Language: Fair   Psychomotor Activity  Psychomotor Activity: Psychomotor Activity:  Normal   Assets  Assets: Resilience  Sleep  Sleep: Sleep: Fair Number of Hours of Sleep: 6  Physical Exam: Physical Exam Vitals and nursing note reviewed.  Cardiovascular:     Rate and Rhythm: Normal rate.     Pulses: Normal pulses.  Pulmonary:     Effort: Pulmonary effort is normal.  Musculoskeletal:        General: Normal range of motion.     Comments: Currently ambulates with the aid of walker.  Skin:    General: Skin is warm and dry.  Neurological:     General: No focal deficit present.     Mental Status: She is oriented to person, place, and time.    Review of Systems  Constitutional:  Negative for chills and fever.  HENT:  Negative for congestion and sore throat.   Respiratory:  Negative for shortness of breath and wheezing.   Cardiovascular:  Negative for chest pain and palpitations.       Hx. CVA  Gastrointestinal:  Negative for abdominal pain, constipation, diarrhea, heartburn, nausea and vomiting.  Musculoskeletal:  Positive for back pain and myalgias.  Neurological:  Negative  for dizziness, tingling, tremors, sensory change, speech change, focal weakness, seizures, loss of consciousness, weakness and headaches.  Endo/Heme/Allergies:        See the allergy lists.  Psychiatric/Behavioral:  Positive for depression, hallucinations and substance abuse (Hx cocaine use disorder.). Negative for memory loss and suicidal ideas. The patient is nervous/anxious. The patient does not have insomnia.    Blood pressure 128/78, pulse 99, temperature (!) 97.5 F (36.4 C), temperature source Oral, resp. rate 16, height 4' 11 (1.499 m), weight 78.8 kg, last menstrual period 06/07/2020, SpO2 100%. Body mass index is 35.1 kg/m.  Treatment Plan Summary: Daily contact with patient to assess and evaluate symptoms and progress in treatment and Medication management.   Diagnoses Active Problems:   Hypertension   History of thrombosis   COPD (chronic obstructive pulmonary  disease) (HCC)   Cocaine use disorder, severe, dependence (HCC)   Alcohol use disorder, mild, abuse   MDD (major depressive disorder), recurrent, severe, with psychosis (HCC)   Medications -Discontinued Celexa  as patient states that this medication is ineffective -Continue Prozac  10 mg today and increase to 20 mg after 2 doses -Continue hydroxyzine  25 mg 3 times daily as needed for anxiety -Continue Seroquel  100 mg nightly for sleep and mood stabilization -Continue Seroquel  25 mg daily in the mornings for anxiety -Continue Ativan  1 mg Q 6 H PRN for CIWAs >10 -Continue Agitation Protocol medications-See the MAR for details: Ativan /Haldol /Benadryl  PRN    Meds for medical reasons: -Continue Eliquis  5 mg twice daily for thrombosis prophylaxis -Continue Norvasc  5 mg daily for hypertension -Continue Lyrica  75 mg twice daily for neuropathy -Continue albuterol  every 6 hours as needed for wheezing/SOB.  -Initiated Protonix  40 mg po Q am for GERD.   PRNS -Continue Tylenol  650 mg every 6 hours PRN for mild pain -Continue Maalox 30 mg every 4 hrs PRN for indigestion -Continue Milk of Magnesia as needed every 6 hrs for constipation   Discharge Planning: Social work and case management to assist with discharge planning and identification of hospital follow-up needs prior to discharge Estimated LOS: 5-7 days Discharge Concerns: Need to establish a safety plan; Medication compliance and effectiveness Discharge Goals: Return home with outpatient referrals for mental health follow-up including medication management/psychotherapy   Mac Bolster, NP, pmhnp, fnp-bc. 05/24/2023, 3:50 PM

## 2023-05-24 NOTE — Progress Notes (Signed)
   05/24/23 2200  Psych Admission Type (Psych Patients Only)  Admission Status Involuntary  Psychosocial Assessment  Patient Complaints Anxiety  Eye Contact Fair  Facial Expression Animated  Affect Anxious  Speech Logical/coherent  Interaction Assertive  Motor Activity Slow  Appearance/Hygiene Unremarkable  Behavior Characteristics Cooperative;Appropriate to situation  Mood Pleasant  Thought Process  Coherency Circumstantial  Content WDL  Delusions None reported or observed  Perception WDL  Hallucination None reported or observed  Judgment Poor  Confusion None  Danger to Self  Current suicidal ideation? Denies  Description of Suicide Plan none  Self-Injurious Behavior No self-injurious ideation or behavior indicators observed or expressed   Agreement Not to Harm Self Yes  Description of Agreement verbal  Danger to Others  Danger to Others None reported or observed  Danger to Others Abnormal  Harmful Behavior to others No threats or harm toward other people  Destructive Behavior No threats or harm toward property

## 2023-05-24 NOTE — Progress Notes (Signed)
   05/23/23 2217  Psych Admission Type (Psych Patients Only)  Admission Status Involuntary  Psychosocial Assessment  Patient Complaints Anxiety;Depression;Crying spells  Eye Contact Fair  Facial Expression Animated  Affect Anxious  Speech Logical/coherent  Interaction Assertive  Motor Activity Slow  Appearance/Hygiene Unremarkable  Behavior Characteristics Cooperative;Appropriate to situation  Mood Depressed;Anxious;Labile  Thought Process  Coherency Circumstantial  Content WDL  Delusions None reported or observed  Perception Hallucinations  Hallucination Auditory;Visual  Judgment Limited  Confusion None  Danger to Self  Current suicidal ideation? Denies  Agreement Not to Harm Self Yes  Description of Agreement verbal

## 2023-05-24 NOTE — [facility] Suicide Risk Assessment (Signed)
 [facility] INPATIENT:  Family/Significant Other Suicide Prevention Education  Suicide Prevention Education:  Education Completed; Kathryn [name], friend, [phone],  (name of family member/significant other) has been identified by the patient as the family member/significant other with whom the patient will be residing, and identified as the person(s) who will aid the patient in the event of a mental health crisis (suicidal ideations/suicide attempt).  With written consent from the patient, the family member/significant other has been provided the following suicide prevention education, prior to the and/or following the discharge of the patient.  Kathryn [name] reports knowing pt her entire life, stating he worries for her safety if she discharges anywhere but a substance use treatment center. Reports pt does not have a place to live, has a lot of medical issues and her substance use will kill her. Pt has no access to firearms or weapons to his knowledge.   The suicide prevention education provided includes the following: Suicide risk factors Suicide prevention and interventions National Suicide Hotline telephone number [facility] assessment telephone number [facility] Emergency Assistance 911 [facility] and/or Residential Mobile Crisis Unit telephone number  Request made of family/significant other to: Remove weapons (e.g., guns, rifles, knives), all items previously/currently identified as safety concern.   Remove drugs/medications (over-the-counter, prescriptions, illicit drugs), all items previously/currently identified as a safety concern.  The family member/significant other verbalizes understanding of the suicide prevention education information provided.  The family member/significant other agrees to remove the items of safety concern listed above.  Kathryn [name] 05/24/2023, 3:54 PM

## 2023-05-25 LAB — T4, FREE: Free T4: 0.83 ng/dL (ref 0.61–1.12)

## 2023-05-25 NOTE — Group Note (Signed)
 Date:  05/25/2023 Time:  5:12 PM  Group Topic/Focus:  Dimensions of Wellness:   The focus of this group is to introduce the topic of wellness and discuss the role each dimension of wellness plays in total health.    Participation Level:  Did Not Attend  Participation Quality:   n/a  Affect:   n/a  Cognitive:   n/a  Insight: None  Engagement in Group:   n/a  Modes of Intervention:   n/a  Additional Comments:   Pt did not attend.  Kathryn [name] [name] 05/25/2023, 5:12 PM

## 2023-05-25 NOTE — Progress Notes (Signed)
     05/25/2023       12:46 PM   Kathryn [name]   Type of Note: [employer] Psychologist with [employer] and confirmed that pt has phone intake assessment this afternoon at 2:00PM. Pt aware and has phone number to call. Pt is aware that she is unable to stay on depression and anxiety medications with their program. MD and NP spoke with pt regarding this as well. Pt agreeable and would like to continue with this intake process.   Will continue to assist as necessary.   Signed:  Gabrian Hoque, LCSW-A 05/25/2023  12:46 PM

## 2023-05-25 NOTE — Progress Notes (Signed)
   05/25/23 2100  Psych Admission Type (Psych Patients Only)  Admission Status Involuntary  Psychosocial Assessment  Patient Complaints Anxiety  Eye Contact Fair  Facial Expression Animated  Affect Anxious  Speech Logical/coherent  Interaction Assertive  Motor Activity Slow  Appearance/Hygiene Unremarkable  Behavior Characteristics Cooperative;Appropriate to situation  Mood Pleasant  Thought Process  Coherency WDL  Content WDL  Delusions None reported or observed  Perception WDL  Hallucination None reported or observed  Judgment Poor  Confusion None  Danger to Self  Current suicidal ideation? Denies  Description of Suicide Plan None  Self-Injurious Behavior No self-injurious ideation or behavior indicators observed or expressed   Agreement Not to Harm Self Yes  Description of Agreement Verbal Contract for Safety  Danger to Others  Danger to Others None reported or observed  Danger to Others Abnormal  Harmful Behavior to others No threats or harm toward other people  Destructive Behavior No threats or harm toward property

## 2023-05-25 NOTE — Progress Notes (Signed)
 [facility] MD Progress Note  05/25/2023 2:53 PM Kathryn [name]  MRN:  [id number]  Subjective:  53 yo AAF with a prior mental health history of cocaine dependence, MDD, and GAD who presented to the [facility] ER on 2/2 for evaluation of global throbbing headaches, intermittent epistaxis, subjective fevers & chills at home. While being worked up medically, pt reported depressive symptoms secondary to financial stressors & homelessness. She reported being SI with plan to walk in front of a car if she could not obtain housing.   Daily notes: Kathryn [name] is seen. Chart reviewed. The chart findings discussed with the treatment team. She was lying down in bed. She states that she is trying to rest. Patient was asked to described her mood in her own words, she replied, I'm doing. Patient was explained that the treatment program in [place]  where she is suppose to be going after discharge has made it clear that they do not allow psychotropic medications in their program. Patient is explained that if she is going to be attending this treatment program, she will not be allowed to go with her Seroquel , Prozac  etc. Patient replied, I will just continue to take my mental health medicines till the day I walked out of this hospital to [place] . Patient at the time seems to not understand that we cannot abruptly stop her from taking her medications. Patient is explained & informed that since this program does not allow psychotropic medications, she Sigel) needs to be gradually weaned off her medications for her mental health issues. Kathryn [name] currently denies any HI, VH, delusional thoughts or paranoia. Patient will have a phone interview today with the staff from this treatment program. She continues to use walker to aid her mobility within the unit. Will continue current plan of care as already in progress.   Principal Problem: MDD (major depressive disorder), severe (HCC)  Diagnosis: Active Problems:   Hypertension    History of thrombosis   COPD (chronic obstructive pulmonary disease) (HCC)   Cocaine use disorder, severe, dependence (HCC)   Alcohol use disorder, mild, abuse   MDD (major depressive disorder), recurrent, severe, with psychosis (HCC)  Total Time spent with patient:  35 minutes  Past Psychiatric History: See H&P.  Past Medical History:  Past Medical History:  Diagnosis Date   Anxiety    Arthritis    Asthma    Chronic back pain    COPD (chronic obstructive pulmonary disease) (HCC)    Chronic bronchitis   Depression    Dyspnea    GERD (gastroesophageal reflux disease)    Hypertension    Migraine    Neuropathy    Osteoarthritis of left knee, 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   Single subsegmental pulmonary embolism without acute cor pulmonale (HCC) 06/20/2021   Sleep apnea 04/2020   GETTING A cpap   Suicidal ideation 12/24/2018    Past Surgical History:  Procedure Laterality Date   BACK SURGERY     DILATATION AND CURETTAGE/HYSTEROSCOPY WITH MINERVA N/A 06/09/2020   Procedure: DILATATION AND CURETTAGE/HYSTEROSCOPY WITH MINERVA;  Surgeon: Jayne Vonn DEL, MD;  Location: [facility];  Service: Gynecology;  Laterality: N/A;   ESOPHAGOGASTRODUODENOSCOPY (EGD) WITH PROPOFOL  N/A 12/25/2018   Procedure: ESOPHAGOGASTRODUODENOSCOPY (EGD) WITH PROPOFOL ;  Surgeon: Golda Claudis PENNER, MD;  Location: [facility];  Service: Endoscopy;  Laterality: N/A;   FLEXIBLE SIGMOIDOSCOPY  10/14/2021   Procedure: FLEXIBLE SIGMOIDOSCOPY;  Surgeon: Eartha Angelia Sieving, MD;  Location: [facility];  Service: Gastroenterology;;   PATELLA-FEMORAL ARTHROPLASTY Left 10/08/2018  Procedure: PATELLA-FEMORAL ARTHROPLASTY;  Surgeon: Josefina Chew, MD;  Location: [facility];  Service: Orthopedics;  Laterality: Left;   TUBAL LIGATION     Family History:  Family History  Problem Relation Age of Onset   Gout Paternal Grandfather    Cirrhosis Paternal Grandfather    Hypertension Paternal Grandmother    Aneurysm Paternal  Grandmother    Cirrhosis Maternal Grandmother    Cirrhosis Maternal Grandfather    Cancer Father    Cirrhosis Father    Cirrhosis Mother    Breast cancer Sister    Hypertension Sister    Bronchitis Daughter    Bronchitis Daughter    Asthma Son    Bronchitis Son    Migraines Neg Hx    Family Psychiatric  History: See H&P.  Social History:  Social History   Substance and Sexual Activity  Alcohol Use Not Currently   Comment: states quit  06/29/22     Social History   Substance and Sexual Activity  Drug Use Yes   Types: Cocaine   Comment: last used 05/2022    Social History   Socioeconomic History   Marital status: Single    Spouse name: Not on file   Number of children: 3   Years of education: 10   Highest education level: 10th grade  Occupational History   Not on file  Tobacco Use   Smoking status: Former    Current packs/day: 1.00    Average packs/day: 1 pack/day for 24.2 years (24.2 ttl pk-yrs)    Types: Cigarettes    Start date: 03/26/1999    Passive exposure: Never   Smokeless tobacco: Never   Tobacco comments:    Smokes often, especially when using crack cocaine  Vaping Use   Vaping status: Never Used  Substance and Sexual Activity   Alcohol use: Not Currently    Comment: states quit  06/29/22   Drug use: Yes    Types: Cocaine    Comment: last used 05/2022   Sexual activity: Yes    Birth control/protection: Surgical    Comment: tubal, ablation  Other Topics Concern   Not on file  Social History Narrative   R handed    Lives with boyfriend   1 Cup of caffeine daily    Social Drivers of Health   Financial Resource Strain: Not on file  Food Insecurity: Food Insecurity Present (05/22/2023)   Hunger Vital Sign    Worried About Running Out of Food in the Last Year: Sometimes true    Ran Out of Food in the Last Year: Sometimes true  Transportation Needs: No Transportation Needs (05/22/2023)   PRAPARE - Administrator, Civil Service (Medical):  No    Lack of Transportation (Non-Medical): No  Recent Concern: Transportation Needs - Unmet Transportation Needs (03/26/2023)   PRAPARE - Administrator, Civil Service (Medical): Yes    Lack of Transportation (Non-Medical): Yes  Physical Activity: Not on file  Stress: Not on file  Social Connections: Unknown (08/30/2021)   Received from [facility], [facility]   Social Network    Social Network: Not on file   Additional Social History:   Sleep: Good  Appetite:  Good  Current Medications: Current Facility-Administered Medications  Medication Dose Route Frequency Provider Last Rate Last Admin   albuterol  (VENTOLIN  HFA) 108 (90 Base) MCG/ACT inhaler 2 puff  2 puff Inhalation Q6H PRN Tex Drilling, NP   2 puff at 05/25/23 0913   alum &  mag hydroxide-simeth (MAALOX/MYLANTA) 200-200-20 MG/5ML suspension 30 mL  30 mL Oral Q4H PRN Coleman, Carolyn H, NP   30 mL at 05/24/23 1445   amLODipine  (NORVASC ) tablet 5 mg  5 mg Oral Daily Nkwenti, Doris, NP   5 mg at 05/25/23 9088   apixaban  (ELIQUIS ) tablet 5 mg  5 mg Oral BID Tex Drilling, NP   5 mg at 05/25/23 9090   haloperidol  (HALDOL ) tablet 5 mg  5 mg Oral TID PRN Mardy Elveria DEL, NP       And   diphenhydrAMINE  (BENADRYL ) capsule 50 mg  50 mg Oral TID PRN Mardy Elveria DEL, NP       haloperidol  lactate (HALDOL ) injection 5 mg  5 mg Intramuscular TID PRN Mardy Elveria DEL, NP       And   diphenhydrAMINE  (BENADRYL ) injection 50 mg  50 mg Intramuscular TID PRN Mardy Elveria DEL, NP       And   LORazepam  (ATIVAN ) injection 2 mg  2 mg Intramuscular TID PRN Coleman, Carolyn H, NP       haloperidol  lactate (HALDOL ) injection 10 mg  10 mg Intramuscular TID PRN Mardy Elveria DEL, NP       And   diphenhydrAMINE  (BENADRYL ) injection 50 mg  50 mg Intramuscular TID PRN Mardy Elveria DEL, NP       And   LORazepam  (ATIVAN ) injection 2 mg  2 mg Intramuscular TID PRN Mardy Elveria DEL, NP       NOREEN ON 05/26/2023] FLUoxetine   (PROZAC ) capsule 20 mg  20 mg Oral Daily Nkwenti, Drilling, NP       hydrOXYzine  (ATARAX ) tablet 25 mg  25 mg Oral TID PRN Coleman, Carolyn H, NP   25 mg at 05/24/23 1445   lidocaine  (LIDODERM ) 5 % 1 patch  1 patch Transdermal Q24H Onuoha, Chinwendu V, NP   1 patch at 05/24/23 2111   loperamide  (IMODIUM ) capsule 2-4 mg  2-4 mg Oral PRN Nkwenti, Doris, NP       LORazepam  (ATIVAN ) tablet 1 mg  1 mg Oral Q6H PRN Tex Drilling, NP       magnesium  hydroxide (MILK OF MAGNESIA) suspension 30 mL  30 mL Oral Daily PRN Coleman, Carolyn H, NP   30 mL at 05/24/23 1536   multivitamin with minerals tablet 1 tablet  1 tablet Oral Daily Tex Drilling, NP   1 tablet at 05/25/23 9086   ondansetron  (ZOFRAN -ODT) disintegrating tablet 4 mg  4 mg Oral Q6H PRN Tex Drilling, NP       pantoprazole  (PROTONIX ) EC tablet 40 mg  40 mg Oral Daily Sayre Mazor I, NP   40 mg at 05/25/23 9090   pregabalin  (LYRICA ) capsule 75 mg  75 mg Oral BID Onuoha, Chinwendu V, NP   75 mg at 05/25/23 0913   QUEtiapine  (SEROQUEL ) tablet 100 mg  100 mg Oral QHS Coleman, Carolyn H, NP   100 mg at 05/24/23 2107   QUEtiapine  (SEROQUEL ) tablet 25 mg  25 mg Oral q AM Tex Drilling, NP   25 mg at 05/25/23 9367   thiamine  (Vitamin B-1) tablet 100 mg  100 mg Oral Daily Nkwenti, Doris, NP   100 mg at 05/25/23 9086   Lab Results:  Results for orders placed or performed during the hospital encounter of 05/22/23 (from the past 48 hours)  TSH     Status: None   Collection Time: 05/24/23  7:25 PM  Result Value Ref Range   TSH 1.612 0.350 -  4.500 uIU/mL    Comment: Performed by a 3rd Generation assay with a functional sensitivity of <=0.01 uIU/mL. Performed at [facility], [address] [place], [place] [address]   VITAMIN D  25 Hydroxy (Vit-D Deficiency, Fractures)     Status: None   Collection Time: 05/24/23  7:25 PM  Result Value Ref Range   Vit D, 25-Hydroxy 33.84 30 - 100 ng/mL    Comment: (NOTE) Vitamin D  deficiency has  been defined by the Institute of Medicine  and an Endocrine Society practice guideline as a level of serum 25-OH  vitamin D  less than 20 ng/mL (1,2). The Endocrine Society went on to  further define vitamin D  insufficiency as a level between 21 and 29  ng/mL (2).  1. IOM (Institute of Medicine). 2010. Dietary reference intakes for  calcium  and D. [place]: [employer]. 2. Holick MF, [place], Bischoff-Ferrari HA, et al. Evaluation,  treatment, and prevention of vitamin D  deficiency: an Endocrine  Society clinical practice guideline, JCEM. 2011 Jul; 96(7): 1911-30.  Performed at [facility], [address], [place], [place] [address]   Vitamin B12     Status: None   Collection Time: 05/24/23  7:25 PM  Result Value Ref Range   Vitamin B-12 493 180 - 914 pg/mL    Comment: (NOTE) This assay is not validated for testing neonatal or myeloproliferative syndrome specimens for Vitamin B12 levels. Performed at [facility], [address], [place], [place] [address]   T4, free     Status: None   Collection Time: 05/24/23  7:25 PM  Result Value Ref Range   Free T4 0.83 0.61 - 1.12 ng/dL    Comment: (NOTE) Biotin ingestion may interfere with free T4 tests. If the results are inconsistent with the TSH level, previous test results, or the clinical presentation, then consider biotin interference. If needed, order repeat testing after stopping biotin. Performed at [facility], [address] [place], [place] [address]    Blood Alcohol level:  Lab Results  Component Value Date   [facility] <10 05/06/2023   ETH <10 03/01/2023   Metabolic Disorder Labs: Lab Results  Component Value Date   HGBA1C 5.7 (H) 05/07/2023   MPG 116.89 05/07/2023   MPG 119.76 03/08/2023   No results found for: PROLACTIN Lab Results  Component Value Date   CHOL 157 05/07/2023   TRIG 104 05/07/2023   HDL 45 05/07/2023   CHOLHDL 3.5 05/07/2023   VLDL 21  05/07/2023   LDLCALC 91 05/07/2023   LDLCALC 73 03/08/2023   Physical Findings: AIMS:  , ,  ,  ,    CIWA:  CIWA-Ar Total: 2 COWS:     Musculoskeletal: Strength & Muscle Tone: within normal limits Gait & Station:  Currently using walker to aid her ambulation/balance. Patient leans: N/A  Psychiatric Specialty Exam:  Presentation  General Appearance:  Casual; Fairly Groomed  Eye Contact: Good  Speech: Clear and Coherent; Normal Rate  Speech Volume: Normal  Handedness: Right   Mood and Affect  Mood: -- (Reports some improvement.)  Affect: Congruent   Thought Process  Thought Processes: Coherent; Goal Directed  Descriptions of Associations:Intact  Orientation:Full (Time, Place and Person)  Thought Content:Logical  History of Schizophrenia/Schizoaffective disorder:No  Duration of Psychotic Symptoms:N/A  Hallucinations:Hallucinations: None Description of Command Hallucinations: NA Description of Auditory Hallucinations: NA Description of Visual Hallucinations: NA   Ideas of Reference:None  Suicidal Thoughts:Suicidal Thoughts: No  Homicidal Thoughts:Homicidal Thoughts: No    Sensorium  Memory: Immediate Good; Recent Good; Remote Good  Judgment: Poor  Insight: Poor   Executive Functions  Concentration: Fair  Attention Span: Fair  Recall: Good  Fund of Knowledge: Fair  Language: Good   Psychomotor Activity  Psychomotor Activity: Psychomotor Activity: Normal    Assets  Assets: Communication Skills; Desire for Improvement; Social Support  Sleep  Sleep: Sleep: Good Number of Hours of Sleep: 8.5   Physical Exam: Physical Exam Vitals and nursing note reviewed.  Cardiovascular:     Rate and Rhythm: Normal rate.     Pulses: Normal pulses.  Pulmonary:     Effort: Pulmonary effort is normal.  Musculoskeletal:        General: Normal range of motion.     Comments: Currently ambulates with the aid of walker.   Skin:    General: Skin is warm and dry.  Neurological:     General: No focal deficit present.     Mental Status: She is oriented to person, place, and time.    Review of Systems  Constitutional:  Negative for chills and fever.  HENT:  Negative for congestion and sore throat.   Respiratory:  Negative for shortness of breath and wheezing.   Cardiovascular:  Negative for chest pain and palpitations.       Hx. CVA  Gastrointestinal:  Negative for abdominal pain, constipation, diarrhea, heartburn, nausea and vomiting.  Musculoskeletal:  Positive for back pain and myalgias.  Neurological:  Negative for dizziness, tingling, tremors, sensory change, speech change, focal weakness, seizures, loss of consciousness, weakness and headaches.  Endo/Heme/Allergies:        See the allergy lists.  Psychiatric/Behavioral:  Positive for depression, hallucinations and substance abuse (Hx cocaine use disorder.). Negative for memory loss and suicidal ideas. The patient is nervous/anxious. The patient does not have insomnia.    Blood pressure 125/86, pulse 94, temperature 98 F (36.7 C), temperature source Oral, resp. rate 20, height 4' 11 (1.499 m), weight 78.8 kg, last menstrual period 06/07/2020, SpO2 99%. Body mass index is 35.1 kg/m.  Treatment Plan Summary: Daily contact with patient to assess and evaluate symptoms and progress in treatment and Medication management.   Diagnoses Active Problems:   Hypertension   History of thrombosis   COPD (chronic obstructive pulmonary disease) (HCC)   Cocaine use disorder, severe, dependence (HCC)   Alcohol use disorder, mild, abuse   MDD (major depressive disorder), recurrent, severe, with psychosis (HCC)   Medications -Discontinued Celexa  as patient states that this medication is ineffective -Continue Prozac  10 mg today and increase to 20 mg after 2 doses -Continue hydroxyzine  25 mg 3 times daily as needed for anxiety -Continue Seroquel  100 mg nightly  for sleep and mood stabilization -Continue Seroquel  25 mg daily in the mornings for anxiety -Continue Ativan  1 mg Q 6 H PRN for CIWAs >10 -Continue Agitation Protocol medications-See the MAR for details: Ativan /Haldol /Benadryl  PRN    Meds for medical reasons: -Continue Eliquis  5 mg twice daily for thrombosis prophylaxis -Continue Norvasc  5 mg daily for hypertension -Continue Lyrica  75 mg twice daily for neuropathy -Continue albuterol  every 6 hours as needed for wheezing/SOB.  -Continue Protonix  40 mg po Q am for GERD.   PRNS -Continue Tylenol  650 mg every 6 hours PRN for mild pain -Continue Maalox 30 mg every 4 hrs PRN for indigestion -Continue Milk of Magnesia as needed every 6 hrs for constipation   Discharge Planning: Social work and case management  to assist with discharge planning and identification of hospital follow-up needs prior to discharge Estimated LOS: 5-7 days Discharge Concerns: Need to establish a safety plan; Medication compliance and effectiveness Discharge Goals: Return home with outpatient referrals for mental health follow-up including medication management/psychotherapy   Mac Bolster, NP, pmhnp, fnp-bc. 05/25/2023, 2:53 PM Patient ID: Montie DELENA Domino, female   DOB: 1970/04/25, 53 y.o.   MRN: [id number]

## 2023-05-25 NOTE — Group Note (Signed)
 Date:  05/25/2023 Time:  1:30 PM  Group Topic/Focus:  Goals Group:   The focus of this group is to help patients establish daily goals to achieve during treatment and discuss how the patient can incorporate goal setting into their daily lives to aide in recovery. Orientation:   The focus of this group is to educate the patient on the purpose and policies of crisis stabilization and provide a format to answer questions about their admission.  The group details unit policies and expectations of patients while admitted.    Participation Level:  Did not attend.  Participation Quality:   n/a  Affect:   n/a  Cognitive:   n/a  Insight: None  Engagement in Group:   n/a  Modes of Intervention:   n/a  Additional Comments:   Pt did not attend.   Addison HERO Zadia Uhde 05/25/2023, 1:30 PM

## 2023-05-25 NOTE — Plan of Care (Signed)
   Problem: Education: Goal: Emotional status will improve Outcome: Progressing Goal: Mental status will improve Outcome: Progressing   Problem: Activity: Goal: Interest or engagement in activities will improve Outcome: Progressing Goal: Sleeping patterns will improve Outcome: Progressing   Problem: Coping: Goal: Ability to verbalize frustrations and anger appropriately will improve Outcome: Progressing Goal: Ability to demonstrate self-control will improve Outcome: Progressing   Problem: Safety: Goal: Periods of time without injury will increase Outcome: Progressing

## 2023-05-25 NOTE — Plan of Care (Signed)
  Problem: Coping: Goal: Ability to verbalize frustrations and anger appropriately will improve Outcome: Progressing   Problem: Safety: Goal: Periods of time without injury will increase Outcome: Progressing   Problem: Education: Goal: Emotional status will improve Outcome: Progressing Goal: Mental status will improve Outcome: Progressing   Problem: Activity: Goal: Interest or engagement in activities will improve Outcome: Progressing

## 2023-05-25 NOTE — Progress Notes (Signed)
   05/25/23 0913  Psych Admission Type (Psych Patients Only)  Admission Status Involuntary  Psychosocial Assessment  Patient Complaints Anxiety  Eye Contact Fair  Facial Expression Animated  Affect Anxious  Speech Logical/coherent  Interaction Assertive  Motor Activity Slow  Appearance/Hygiene Unremarkable  Behavior Characteristics Cooperative;Appropriate to situation  Mood Pleasant  Aggressive Behavior  Effect No apparent injury  Thought Process  Coherency Circumstantial  Content WDL  Delusions None reported or observed  Perception WDL  Hallucination None reported or observed  Judgment Poor  Confusion None  Danger to Self  Current suicidal ideation? Denies  Self-Injurious Behavior No self-injurious ideation or behavior indicators observed or expressed   Agreement Not to Harm Self Yes  Description of Agreement Verbal  Danger to Others  Danger to Others None reported or observed  Danger to Others Abnormal  Harmful Behavior to others No threats or harm toward other people  Destructive Behavior No threats or harm toward property

## 2023-05-25 NOTE — Group Note (Signed)
 Date:  05/25/2023 Time:  9:29 PM  Group Topic/Focus:  AA Meeting    Participation Level:  Active  Participation Quality:  Appropriate  Affect:  Appropriate  Cognitive:  Appropriate  Insight: Appropriate  Engagement in Group:  Engaged  Modes of Intervention:  Socialization and Support  Additional Comments:  Patient attended AA   Eward Mace 05/25/2023, 9:29 PM

## 2023-05-25 NOTE — Group Note (Signed)
 Recreation Therapy Group Note   Group Topic:Problem Solving  Group Date: 05/25/2023 Start Time: 0934 End Time: 0956 Facilitators: Mendell Bontempo-McCall, LRT,CTRS Location: [address] Dayroom   Group Topic: Communication, Team Building, Problem Solving  Goal Area(s) Addresses:  Patient will effectively work with peer towards shared goal.  Patient will identify skills used to make activity successful.  Patient will identify how skills used during activity can be used to reach post d/c goals.   Intervention: STEM Activity  Activity: Stage Manager. In teams of 3-5, patients were given 12 plastic drinking straws and an equal length of masking tape. Using the materials provided, patients were asked to build a landing pad to catch a golf ball dropped from approximately 5 feet in the air. All materials were required to be used by the team in their design. LRT facilitated post-activity discussion.  Education: Pharmacist, Community, Scientist, Physiological, Discharge Planning   Education Outcome: Acknowledges education/In group clarification offered/Needs additional education.    Affect/Mood: Appropriate   Participation Level: None   Participation Quality: Independent   Behavior: Attentive  and On-looking   Speech/Thought Process: Relevant   Insight: Fair   Judgement: Fair    Modes of Intervention: Problem-solving   Patient Response to Interventions:  Attentive   Education Outcome:  In group clarification offered    Clinical Observations/Individualized Feedback: Pt attended group session and was observant. At times, pt offered suggestions to peers as they worked.     Plan: Continue to engage patient in RT group sessions 2-3x/week.   Kathryn [name], LRT,CTRS 05/25/2023 12:52 PM

## 2023-05-26 MED ORDER — FLUOXETINE HCL 10 MG PO CAPS
10.0000 mg | ORAL_CAPSULE | Freq: Once | ORAL | Status: DC
  Filled 2023-05-26 (×2): qty 1

## 2023-05-26 MED ORDER — QUETIAPINE FUMARATE 50 MG PO TABS
50.0000 mg | ORAL_TABLET | Freq: Once | ORAL | Status: AC
  Administered 2023-05-26: 50 mg via ORAL
  Filled 2023-05-26 (×2): qty 1

## 2023-05-26 NOTE — Plan of Care (Signed)
  Problem: Education: Goal: Emotional status will improve Outcome: Progressing Goal: Mental status will improve Outcome: Progressing   Problem: Activity: Goal: Interest or engagement in activities will improve Outcome: Progressing Goal: Sleeping patterns will improve Outcome: Progressing   Problem: Coping: Goal: Ability to verbalize frustrations and anger appropriately will improve Outcome: Progressing Goal: Ability to demonstrate self-control will improve Outcome: Progressing   Problem: Health Behavior/Discharge Planning: Goal: Identification of resources available to assist in meeting health care needs will improve Outcome: Progressing Goal: Compliance with treatment plan for underlying cause of condition will improve Outcome: Progressing   Problem: Physical Regulation: Goal: Ability to maintain clinical measurements within normal limits will improve Outcome: Progressing   Problem: Safety: Goal: Periods of time without injury will increase Outcome: Progressing

## 2023-05-26 NOTE — [facility] Group Notes (Signed)
 Pt participated in group activity

## 2023-05-26 NOTE — Progress Notes (Signed)
   05/26/23 0900  Psych Admission Type (Psych Patients Only)  Admission Status Involuntary  Psychosocial Assessment  Patient Complaints Anxiety;Depression  Eye Contact Fair  Facial Expression Animated  Affect Anxious  Speech Logical/coherent  Interaction Assertive  Motor Activity Slow  Appearance/Hygiene Unremarkable  Behavior Characteristics Cooperative  Mood Pleasant  Thought Process  Coherency WDL  Content WDL  Delusions None reported or observed  Perception Hallucinations  Hallucination Auditory  Judgment Poor  Confusion None  Danger to Self  Current suicidal ideation? Passive  Agreement Not to Harm Self Yes  Danger to Others Abnormal  Harmful Behavior to others No threats or harm toward other people

## 2023-05-26 NOTE — [facility] Group Notes (Signed)
 [facility] LCSW Group Therapy Note  Date/Time:  1:30pm- 2:30 pm  Type of Therapy and Topic:  Group Therapy:  Self-Soothing  Participation Level:  Did Not Attend   Description of Group This process group involved a discussion with and between patients about self-soothing.   The difference between healthy and unhealthy coping skills was described, then examples were elicited from group members for healthy and unhealthy self-soothing techniques.  This then was followed by a discussion about self-soothing, what it is, how important it is, and why we choose the coping techniques we choose.  Participants were encouraged to think about self-soothing as necessary and positive.  Therapeutic Goals Patient will identify and describe one healthy and one unhealthy self-soothing technique they often use Patient will participate in generating ideas about healthy self-soothing options for both during this hospital stay and when they return to the community Patients will be supportive of one another and receive support from others Patients will understand the need all humans have to self-soothe  Summary of Patient Progress:  NA   Therapeutic Modalities Brief Solution-Focused Therapy Psychoeducation

## 2023-05-26 NOTE — Progress Notes (Signed)
 [facility] MD Progress Note  05/26/2023 2:34 PM Kathryn [name]  MRN:  [id number]  Subjective:  53 yo AAF with a prior mental health history of cocaine dependence, MDD, and GAD who presented to the [facility] ER on 2/2 for evaluation of global throbbing headaches, intermittent epistaxis, subjective fevers & chills at home. While being worked up medically, pt reported depressive symptoms secondary to financial stressors & homelessness. She reported being SI with plan to walk in front of a car if she could not obtain housing.   Daily notes: [name] is seen. Chart reviewed. The chart findings discussed with the treatment team. She presents alert, oriented & aware of situation. She presents with a cheerful affect & good eye contact. She reports, I'm smiling now because I just won the bingo few minutes ago. But, I'm still hearing the voices telling me to go hurt myself. I tried to grab a razor that I saw near the nurses's desk, but one of the staff got to it first & took it away. When patient was approached & informed that hearing voices & feeling like hurting herself will disqualify her from getting into the substance abuse treatment program in [place] . Patient told this provider to cancel what she had just said & wrote that she declines hearing voices. She says she will pray to God to remove those voices from her head. Patient has finally agreed for her treatment team to start titrating down her psychotropic medications to wean her off by discharge. She is observed today walking from the day room to the unit hallway without her walker. Although continues to endorse AH, her affect, mannerisms & attitude are non-congruent to her complaints. We will continue current plan of care as already in progress.   Principal Problem: MDD (major depressive disorder), severe (HCC)  Diagnosis: Active Problems:   Hypertension   History of thrombosis   COPD (chronic obstructive pulmonary disease) (HCC)   Cocaine use  disorder, severe, dependence (HCC)   Alcohol use disorder, mild, abuse   MDD (major depressive disorder), recurrent, severe, with psychosis (HCC)  Total Time spent with patient:  35 minutes  Past Psychiatric History: See H&P.  Past Medical History:  Past Medical History:  Diagnosis Date   Anxiety    Arthritis    Asthma    Chronic back pain    COPD (chronic obstructive pulmonary disease) (HCC)    Chronic bronchitis   Depression    Dyspnea    GERD (gastroesophageal reflux disease)    Hypertension    Migraine    Neuropathy    Osteoarthritis of left knee, 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   Single subsegmental pulmonary embolism without acute cor pulmonale (HCC) 06/20/2021   Sleep apnea 04/2020   GETTING A cpap   Suicidal ideation 12/24/2018    Past Surgical History:  Procedure Laterality Date   BACK SURGERY     DILATATION AND CURETTAGE/HYSTEROSCOPY WITH MINERVA N/A 06/09/2020   Procedure: DILATATION AND CURETTAGE/HYSTEROSCOPY WITH MINERVA;  Surgeon: Jayne Vonn DEL, MD;  Location: [facility];  Service: Gynecology;  Laterality: N/A;   ESOPHAGOGASTRODUODENOSCOPY (EGD) WITH PROPOFOL  N/A 12/25/2018   Procedure: ESOPHAGOGASTRODUODENOSCOPY (EGD) WITH PROPOFOL ;  Surgeon: Golda Claudis PENNER, MD;  Location: [facility];  Service: Endoscopy;  Laterality: N/A;   FLEXIBLE SIGMOIDOSCOPY  10/14/2021   Procedure: FLEXIBLE SIGMOIDOSCOPY;  Surgeon: Eartha Angelia Sieving, MD;  Location: [facility];  Service: Gastroenterology;;   PATELLA-FEMORAL ARTHROPLASTY Left 10/08/2018   Procedure: PATELLA-FEMORAL ARTHROPLASTY;  Surgeon: Josefina Chew, MD;  Location:  [facility];  Service: Orthopedics;  Laterality: Left;   TUBAL LIGATION     Family History:  Family History  Problem Relation Age of Onset   Gout Paternal Grandfather    Cirrhosis Paternal Grandfather    Hypertension Paternal Grandmother    Aneurysm Paternal Grandmother    Cirrhosis Maternal Grandmother    Cirrhosis Maternal Grandfather    Cancer  Father    Cirrhosis Father    Cirrhosis Mother    Breast cancer Sister    Hypertension Sister    Bronchitis Daughter    Bronchitis Daughter    Asthma Son    Bronchitis Son    Migraines Neg Hx    Family Psychiatric  History: See H&P.  Social History:  Social History   Substance and Sexual Activity  Alcohol Use Not Currently   Comment: states quit  06/29/22     Social History   Substance and Sexual Activity  Drug Use Yes   Types: Cocaine   Comment: last used 05/2022    Social History   Socioeconomic History   Marital status: Single    Spouse name: Not on file   Number of children: 3   Years of education: 10   Highest education level: 10th grade  Occupational History   Not on file  Tobacco Use   Smoking status: Former    Current packs/day: 1.00    Average packs/day: 1 pack/day for 24.2 years (24.2 ttl pk-yrs)    Types: Cigarettes    Start date: 03/26/1999    Passive exposure: Never   Smokeless tobacco: Never   Tobacco comments:    Smokes often, especially when using crack cocaine  Vaping Use   Vaping status: Never Used  Substance and Sexual Activity   Alcohol use: Not Currently    Comment: states quit  06/29/22   Drug use: Yes    Types: Cocaine    Comment: last used 05/2022   Sexual activity: Yes    Birth control/protection: Surgical    Comment: tubal, ablation  Other Topics Concern   Not on file  Social History Narrative   R handed    Lives with boyfriend   1 Cup of caffeine daily    Social Drivers of Health   Financial Resource Strain: Not on file  Food Insecurity: Food Insecurity Present (05/22/2023)   Hunger Vital Sign    Worried About Running Out of Food in the Last Year: Sometimes true    Ran Out of Food in the Last Year: Sometimes true  Transportation Needs: No Transportation Needs (05/22/2023)   PRAPARE - Administrator, Civil Service (Medical): No    Lack of Transportation (Non-Medical): No  Recent Concern: Transportation Needs -  Unmet Transportation Needs (03/26/2023)   PRAPARE - Administrator, Civil Service (Medical): Yes    Lack of Transportation (Non-Medical): Yes  Physical Activity: Not on file  Stress: Not on file  Social Connections: Unknown (08/30/2021)   Received from [facility]   Social Network    Social Network: Not on file   Additional Social History:   Sleep: Good  Appetite:  Good  Current Medications: Current Facility-Administered Medications  Medication Dose Route Frequency Provider Last Rate Last Admin   albuterol  (VENTOLIN  HFA) 108 (90 Base) MCG/ACT inhaler 2 puff  2 puff Inhalation Q6H PRN Tex Drilling, NP   2 puff at 05/25/23 0913   alum & mag hydroxide-simeth (MAALOX/MYLANTA) 200-200-20 MG/5ML suspension 30 mL  30  mL Oral Q4H PRN Coleman, Carolyn H, NP   30 mL at 05/24/23 1445   amLODipine  (NORVASC ) tablet 5 mg  5 mg Oral Daily Nkwenti, Doris, NP   5 mg at 05/26/23 0803   apixaban  (ELIQUIS ) tablet 5 mg  5 mg Oral BID Tex Drilling, NP   5 mg at 05/26/23 9195   haloperidol  (HALDOL ) tablet 5 mg  5 mg Oral TID PRN Mardy Elveria DEL, NP       And   diphenhydrAMINE  (BENADRYL ) capsule 50 mg  50 mg Oral TID PRN Mardy Elveria DEL, NP       haloperidol  lactate (HALDOL ) injection 5 mg  5 mg Intramuscular TID PRN Mardy Elveria DEL, NP       And   diphenhydrAMINE  (BENADRYL ) injection 50 mg  50 mg Intramuscular TID PRN Mardy Elveria DEL, NP       And   LORazepam  (ATIVAN ) injection 2 mg  2 mg Intramuscular TID PRN Coleman, Carolyn H, NP       haloperidol  lactate (HALDOL ) injection 10 mg  10 mg Intramuscular TID PRN Mardy Elveria DEL, NP       And   diphenhydrAMINE  (BENADRYL ) injection 50 mg  50 mg Intramuscular TID PRN Mardy Elveria DEL, NP       And   LORazepam  (ATIVAN ) injection 2 mg  2 mg Intramuscular TID PRN Coleman, Carolyn H, NP       FLUoxetine  (PROZAC ) capsule 20 mg  20 mg Oral Daily Nkwenti, Doris, NP   20 mg at 05/26/23 0804   hydrOXYzine  (ATARAX )  tablet 25 mg  25 mg Oral TID PRN Coleman, Carolyn H, NP   25 mg at 05/24/23 1445   lidocaine  (LIDODERM ) 5 % 1 patch  1 patch Transdermal Q24H Onuoha, Chinwendu V, NP   1 patch at 05/25/23 2113   magnesium  hydroxide (MILK OF MAGNESIA) suspension 30 mL  30 mL Oral Daily PRN Coleman, Carolyn H, NP   30 mL at 05/24/23 1536   multivitamin with minerals tablet 1 tablet  1 tablet Oral Daily Tex Drilling, NP   1 tablet at 05/26/23 0804   pantoprazole  (PROTONIX ) EC tablet 40 mg  40 mg Oral Daily Martell Mcfadyen I, NP   40 mg at 05/26/23 0804   pregabalin  (LYRICA ) capsule 75 mg  75 mg Oral BID Onuoha, Chinwendu V, NP   75 mg at 05/26/23 0805   QUEtiapine  (SEROQUEL ) tablet 100 mg  100 mg Oral QHS Coleman, Carolyn H, NP   100 mg at 05/25/23 2113   QUEtiapine  (SEROQUEL ) tablet 25 mg  25 mg Oral q AM Tex Drilling, NP   25 mg at 05/26/23 0625   thiamine  (Vitamin B-1) tablet 100 mg  100 mg Oral Daily Nkwenti, Doris, NP   100 mg at 05/26/23 9195   Lab Results:  Results for orders placed or performed during the hospital encounter of 05/22/23 (from the past 48 hours)  TSH     Status: None   Collection Time: 05/24/23  7:25 PM  Result Value Ref Range   TSH 1.612 0.350 - 4.500 uIU/mL    Comment: Performed by a 3rd Generation assay with a functional sensitivity of <=0.01 uIU/mL. Performed at [facility], [address] [place], [place] [address]   VITAMIN D  25 Hydroxy (Vit-D Deficiency, Fractures)     Status: None   Collection Time: 05/24/23  7:25 PM  Result Value Ref Range   Vit D, 25-Hydroxy 33.84 30 - 100 ng/mL  Comment: (NOTE) Vitamin D  deficiency has been defined by the Institute of Medicine  and an Endocrine Society practice guideline as a level of serum 25-OH  vitamin D  less than 20 ng/mL (1,2). The Endocrine Society went on to  further define vitamin D  insufficiency as a level between 21 and 29  ng/mL (2).  1. IOM (Institute of Medicine). 2010. Dietary reference intakes for   calcium  and D. [place]: [employer]. 2. Holick MF, Binkley Lena, Bischoff-Ferrari HA, et al. Evaluation,  treatment, and prevention of vitamin D  deficiency: an Endocrine  Society clinical practice guideline, JCEM. 2011 Jul; 96(7): 1911-30.  Performed at [facility], [address] [place], [place] [address]   Vitamin B12     Status: None   Collection Time: 05/24/23  7:25 PM  Result Value Ref Range   Vitamin B-12 493 180 - 914 pg/mL    Comment: (NOTE) This assay is not validated for testing neonatal or myeloproliferative syndrome specimens for Vitamin B12 levels. Performed at [facility], [address], [place], [place] [address]   T4, free     Status: None   Collection Time: 05/24/23  7:25 PM  Result Value Ref Range   Free T4 0.83 0.61 - 1.12 ng/dL    Comment: (NOTE) Biotin ingestion may interfere with free T4 tests. If the results are inconsistent with the TSH level, previous test results, or the clinical presentation, then consider biotin interference. If needed, order repeat testing after stopping biotin. Performed at [facility], [address], [place], [place] [address]    Blood Alcohol level:  Lab Results  Component Value Date   [facility] <10 05/06/2023   ETH <10 03/01/2023   Metabolic Disorder Labs: Lab Results  Component Value Date   HGBA1C 5.7 (H) 05/07/2023   MPG 116.89 05/07/2023   MPG 119.76 03/08/2023   No results found for: PROLACTIN Lab Results  Component Value Date   CHOL 157 05/07/2023   TRIG 104 05/07/2023   HDL 45 05/07/2023   CHOLHDL 3.5 05/07/2023   VLDL 21 05/07/2023   LDLCALC 91 05/07/2023   LDLCALC 73 03/08/2023   Physical Findings: AIMS:  , ,  ,  ,    CIWA:  CIWA-Ar Total: 0 COWS:     Musculoskeletal: Strength & Muscle Tone: within normal limits Gait & Station:  Currently using walker to aid her ambulation/balance. Patient leans: N/A  Psychiatric Specialty  Exam:  Presentation  General Appearance:  Casual; Fairly Groomed  Eye Contact: Good  Speech: Clear and Coherent; Normal Rate  Speech Volume: Normal  Handedness: Right   Mood and Affect  Mood: -- (Reports some improvement.)  Affect: Congruent   Thought Process  Thought Processes: Coherent; Goal Directed  Descriptions of Associations:Intact  Orientation:Full (Time, Place and Person)  Thought Content:Logical  History of Schizophrenia/Schizoaffective disorder:No  Duration of Psychotic Symptoms:N/A  Hallucinations:Hallucinations: None Description of Command Hallucinations: NA Description of Auditory Hallucinations: NA Description of Visual Hallucinations: NA   Ideas of Reference:None  Suicidal Thoughts:Suicidal Thoughts: No   Homicidal Thoughts:Homicidal Thoughts: No    Sensorium  Memory: Immediate Good; Recent Good; Remote Good  Judgment: Poor  Insight: Poor   Executive Functions  Concentration: Fair  Attention Span: Fair  Recall: Good  Fund of Knowledge: Fair  Language: Good   Psychomotor Activity  Psychomotor Activity: Psychomotor Activity: Normal    Assets  Assets: Communication Skills; Desire for Improvement; Social Support  Sleep  Sleep: Sleep:  Good Number of Hours of Sleep: 8.5   Physical Exam: Physical Exam Vitals and nursing note reviewed.  Cardiovascular:     Rate and Rhythm: Normal rate.     Pulses: Normal pulses.  Pulmonary:     Effort: Pulmonary effort is normal.  Musculoskeletal:        General: Normal range of motion.     Comments: Currently ambulates with the aid of walker.  Skin:    General: Skin is warm and dry.  Neurological:     General: No focal deficit present.     Mental Status: She is oriented to person, place, and time.    Review of Systems  Constitutional:  Negative for chills and fever.  HENT:  Negative for congestion and sore throat.   Respiratory:  Negative for shortness of  breath and wheezing.   Cardiovascular:  Negative for chest pain and palpitations.       Hx. CVA  Gastrointestinal:  Negative for abdominal pain, constipation, diarrhea, heartburn, nausea and vomiting.  Musculoskeletal:  Positive for back pain and myalgias.  Neurological:  Negative for dizziness, tingling, tremors, sensory change, speech change, focal weakness, seizures, loss of consciousness, weakness and headaches.  Endo/Heme/Allergies:        See the allergy lists.  Psychiatric/Behavioral:  Positive for depression, hallucinations and substance abuse (Hx cocaine use disorder.). Negative for memory loss and suicidal ideas. The patient is nervous/anxious. The patient does not have insomnia.    Blood pressure 122/79, pulse 90, temperature (!) 97.5 F (36.4 C), temperature source Oral, resp. rate 20, height 4' 11 (1.499 m), weight 78.8 kg, last menstrual period 06/07/2020, SpO2 100%. Body mass index is 35.1 kg/m.  Treatment Plan Summary: Daily contact with patient to assess and evaluate symptoms and progress in treatment and Medication management.   Diagnoses Active Problems:   Hypertension   History of thrombosis   COPD (chronic obstructive pulmonary disease) (HCC)   Cocaine use disorder, severe, dependence (HCC)   Alcohol use disorder, mild, abuse   MDD (major depressive disorder), recurrent, severe, with psychosis (HCC)   Medications -Discontinued Celexa  as patient states that this medication is ineffective -decreased Prozac  to 10 mg mg after x once -Continue hydroxyzine  25 mg 3 times daily as needed for anxiety -Decreased Seroquel  to 50 mg nightly for sleep and mood stabilization x once -Discontinued Seroquel  25 mg daily in the mornings for anxiety -Continue Ativan  1 mg Q 6 H PRN for CIWAs >10 -Continue Agitation Protocol medications-See the MAR for details: Ativan /Haldol /Benadryl  PRN    Meds for medical reasons: -Continue Eliquis  5 mg twice daily for thrombosis  prophylaxis -Continue Norvasc  5 mg daily for hypertension -Continue Lyrica  75 mg twice daily for neuropathy -Continue albuterol  every 6 hours as needed for wheezing/SOB.  -Continue Protonix  40 mg po Q am for GERD.   PRNS -Continue Tylenol  650 mg every 6 hours PRN for mild pain -Continue Maalox 30 mg every 4 hrs PRN for indigestion -Continue Milk of Magnesia as needed every 6 hrs for constipation   Discharge Planning: Social work and case management to assist with discharge planning and identification of hospital follow-up needs prior to discharge Estimated LOS: 5-7 days Discharge Concerns: Need to establish a safety plan; Medication compliance and effectiveness Discharge Goals: Return home with outpatient referrals for mental health follow-up including medication management/psychotherapy   Mac Bolster, NP, pmhnp, fnp-bc. 05/26/2023, 2:34 PM Patient ID: Kathryn [name], female   DOB: 01/05/1971, 53 y.o.   MRN: [id number] Patient ID:  Kathryn [name], female   DOB: 1970/07/13, 53 y.o.   MRN: [id number]

## 2023-05-26 NOTE — Group Note (Signed)
 Date:  05/26/2023 Time:  9:23 PM  Group Topic/Focus:  Wrap-Up Group:   The focus of this group is to help patients review their daily goal of treatment and discuss progress on daily workbooks.    Additional Comments:   Pt was encouraged, but opted out of attended wrap up group this evening.   Kathryn [name] 05/26/2023, 9:23 PM

## 2023-05-26 NOTE — [facility] Group Notes (Signed)
 [facility] Group Notes:  (Nursing/MHT/Case Management/Adjunct)  Date:  05/26/2023  Time:  9:23 AM  Type of Therapy:   Goals  Participation Level:  Active  Participation Quality:  Appropriate  Affect:  Appropriate  Cognitive:  Appropriate  Insight:  Appropriate  Engagement in Group:  Engaged  Modes of Intervention:  Discussion, Orientation, and Socialization  Summary of Progress/Problems:  Kathryn [name] Sierra 05/26/2023, 9:23 AM

## 2023-05-27 LAB — T3, FREE: T3, Free: 2.8 pg/mL (ref 2.0–4.4)

## 2023-05-27 MED ORDER — LIDOCAINE 5 % EX PTCH
1.0000 | MEDICATED_PATCH | CUTANEOUS | Status: DC
  Administered 2023-05-27 – 2023-05-29 (×3): 1 via TRANSDERMAL
  Filled 2023-05-27 (×5): qty 1

## 2023-05-27 NOTE — Progress Notes (Signed)
   05/26/23 2200  Psych Admission Type (Psych Patients Only)  Admission Status Involuntary  Psychosocial Assessment  Patient Complaints Worrying  Eye Contact Fair  Facial Expression Animated  Affect Appropriate to circumstance  Speech Logical/coherent  Interaction Assertive  Motor Activity Slow  Appearance/Hygiene Unremarkable  Behavior Characteristics Cooperative;Appropriate to situation  Mood Pleasant  Thought Process  Coherency WDL  Content WDL  Delusions None reported or observed  Perception WDL  Hallucination None reported or observed  Judgment Poor  Confusion None  Danger to Self  Current suicidal ideation? Denies  Description of Suicide Plan none  Self-Injurious Behavior No self-injurious ideation or behavior indicators observed or expressed   Agreement Not to Harm Self Yes  Description of Agreement verbal  Danger to Others  Danger to Others None reported or observed  Danger to Others Abnormal  Harmful Behavior to others No threats or harm toward other people  Destructive Behavior No threats or harm toward property

## 2023-05-27 NOTE — Plan of Care (Signed)
  Problem: Education: Goal: Emotional status will improve Outcome: Progressing   Problem: Activity: Goal: Interest or engagement in activities will improve Outcome: Progressing   Problem: Coping: Goal: Ability to verbalize frustrations and anger appropriately will improve Outcome: Progressing

## 2023-05-27 NOTE — Progress Notes (Signed)
 Patient states she slept good last night and presents in animated mood. Patient denies active SI,HI, and A/V/H with no plan or intent but states her depression and anxiety are a 10/10 and her bilateral knee pain is not helping. NP notified and patient received lidocaine  patch which helped provide relief. Patient has been socializing appropriately in dayroom and has been med compliant. No s/s of current distress.

## 2023-05-27 NOTE — Group Note (Signed)
 Date:  05/27/2023 Time:  4:24 PM  Group Topic/Focus:  Goals Group:   The focus of this group is to help patients establish daily goals to achieve during treatment and discuss how the patient can incorporate goal setting into their daily lives to aide in recovery. Orientation:   The focus of this group is to educate the patient on the purpose and policies of crisis stabilization and provide a format to answer questions about their admission.  The group details unit policies and expectations of patients while admitted.    Participation Level:  Active  Participation Quality:  Appropriate  Affect:  Appropriate  Cognitive:  Appropriate  Insight: Appropriate  Engagement in Group:  Engaged  Modes of Intervention:  Activity, Orientation, and Rapport Building  Additional Comments:   Pt attended and participated in the Orientation and Goals group. Pt completed the goals activity. Pt personal goal for today is to learn and focus on her purpose and calling.   Addison HERO Edith Lord 05/27/2023, 4:24 PM

## 2023-05-27 NOTE — Group Note (Signed)
 Date:  05/28/2023 Time:  11:34 AM  Group Topic/Focus:  Emotional Education:   The focus of this group is to discuss what feelings/emotions are, and how they are experienced. Managing Feelings:   The focus of this group is to identify what feelings patients have difficulty handling and develop a plan to handle them in a healthier way upon discharge.    Participation Level:  Active  Participation Quality:  Appropriate  Affect:  Appropriate  Cognitive:  Appropriate  Insight: Appropriate  Engagement in Group:  Engaged  Modes of Intervention:  Activity, Socialization, and Support  Additional Comments:   Pt actively participated in the Emotional Wellness group. Pt contributed to the discussion on managing emotions and assisted with comprising a play list to utilize as a healthy and effective coping strategy for symptom management and emotional regulation.   Addison HERO Colbe Viviano 05/28/2023, 11:34 AM

## 2023-05-27 NOTE — Progress Notes (Signed)
 [facility], [place] MD Progress Note  05/27/2023 2:01 PM Kathryn [name]  MRN:  [id number]  Subjective:  53 yo AAF with a prior mental health history of cocaine dependence, MDD, and GAD who presented to the [facility] ER on 2/2 for evaluation of global throbbing headaches, intermittent epistaxis, subjective fevers & chills at home. While being worked up medically, pt reported depressive symptoms secondary to financial stressors & homelessness. She reported being SI with plan to walk in front of a car if she could not obtain housing.   Daily notes: Chiante is seen. Chart reviewed. The chart findings discussed with the treatment team. She presents alert, oriented & aware of situation. She presents with a cheerful affect & good eye contact. She is visible on the unit, attending group sessions. She reports, My joy, happiness & the ability to do well are #10 today. My #10 today means I'm doing my best today. I have a doctor's appointment tomorrow. This will be my post-comatose appointment. I see Charmaine, she is my radio broadcast assistant. I slept well last night. My appetite is good. Patient at this time denies any SIHI, AVH, delusional thoughts or paranoia. She does not appear to be responding to any internal stimuli. Later on in the shift, patient complains of pain to her back areas. She declines Ibuprofen & or tylenol , however, she did receive her Lidocaine  patch. There are no changes made on the current plan of care, will continue as already in progress.   Principal Problem: MDD (major depressive disorder), severe (HCC)  Diagnosis: Active Problems:   Hypertension   History of thrombosis   COPD (chronic obstructive pulmonary disease) (HCC)   Cocaine use disorder, severe, dependence (HCC)   Alcohol use disorder, mild, abuse   MDD (major depressive disorder), recurrent, severe, with psychosis (HCC)  Total Time spent with patient:  35 minutes  Past Psychiatric History: See H&P.  Past Medical History:  Past Medical  History:  Diagnosis Date   Anxiety    Arthritis    Asthma    Chronic back pain    COPD (chronic obstructive pulmonary disease) (HCC)    Chronic bronchitis   Depression    Dyspnea    GERD (gastroesophageal reflux disease)    Hypertension    Migraine    Neuropathy    Osteoarthritis of left knee, 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   Single subsegmental pulmonary embolism without acute cor pulmonale (HCC) 06/20/2021   Sleep apnea 04/2020   GETTING A cpap   Suicidal ideation 12/24/2018    Past Surgical History:  Procedure Laterality Date   BACK SURGERY     DILATATION AND CURETTAGE/HYSTEROSCOPY WITH MINERVA N/A 06/09/2020   Procedure: DILATATION AND CURETTAGE/HYSTEROSCOPY WITH MINERVA;  Surgeon: Jayne Vonn DEL, MD;  Location: [facility];  Service: Gynecology;  Laterality: N/A;   ESOPHAGOGASTRODUODENOSCOPY (EGD) WITH PROPOFOL  N/A 12/25/2018   Procedure: ESOPHAGOGASTRODUODENOSCOPY (EGD) WITH PROPOFOL ;  Surgeon: Golda Claudis PENNER, MD;  Location: [facility];  Service: Endoscopy;  Laterality: N/A;   FLEXIBLE SIGMOIDOSCOPY  10/14/2021   Procedure: FLEXIBLE SIGMOIDOSCOPY;  Surgeon: Eartha Angelia Sieving, MD;  Location: [facility];  Service: Gastroenterology;;   PATELLA-FEMORAL ARTHROPLASTY Left 10/08/2018   Procedure: PATELLA-FEMORAL ARTHROPLASTY;  Surgeon: Josefina Chew, MD;  Location: [facility];  Service: Orthopedics;  Laterality: Left;   TUBAL LIGATION     Family History:  Family History  Problem Relation Age of Onset   Gout Paternal Grandfather    Cirrhosis Paternal Grandfather    Hypertension Paternal Grandmother    Aneurysm  Paternal Grandmother    Cirrhosis Maternal Grandmother    Cirrhosis Maternal Grandfather    Cancer Father    Cirrhosis Father    Cirrhosis Mother    Breast cancer Sister    Hypertension Sister    Bronchitis Daughter    Bronchitis Daughter    Asthma Son    Bronchitis Son    Migraines Neg Hx    Family Psychiatric  History: See H&P.  Social History:  Social  History   Substance and Sexual Activity  Alcohol Use Not Currently   Comment: states quit  06/29/22     Social History   Substance and Sexual Activity  Drug Use Yes   Types: Cocaine   Comment: last used 05/2022    Social History   Socioeconomic History   Marital status: Single    Spouse name: Not on file   Number of children: 3   Years of education: 10   Highest education level: 10th grade  Occupational History   Not on file  Tobacco Use   Smoking status: Former    Current packs/day: 1.00    Average packs/day: 1 pack/day for 24.2 years (24.2 ttl pk-yrs)    Types: Cigarettes    Start date: 03/26/1999    Passive exposure: Never   Smokeless tobacco: Never   Tobacco comments:    Smokes often, especially when using crack cocaine  Vaping Use   Vaping status: Never Used  Substance and Sexual Activity   Alcohol use: Not Currently    Comment: states quit  06/29/22   Drug use: Yes    Types: Cocaine    Comment: last used 05/2022   Sexual activity: Yes    Birth control/protection: Surgical    Comment: tubal, ablation  Other Topics Concern   Not on file  Social History Narrative   R handed    Lives with boyfriend   1 Cup of caffeine daily    Social Drivers of Health   Financial Resource Strain: Not on file  Food Insecurity: Food Insecurity Present (05/22/2023)   Hunger Vital Sign    Worried About Running Out of Food in the Last Year: Sometimes true    Ran Out of Food in the Last Year: Sometimes true  Transportation Needs: No Transportation Needs (05/22/2023)   PRAPARE - Administrator, Civil Service (Medical): No    Lack of Transportation (Non-Medical): No  Recent Concern: Transportation Needs - Unmet Transportation Needs (03/26/2023)   PRAPARE - Administrator, Civil Service (Medical): Yes    Lack of Transportation (Non-Medical): Yes  Physical Activity: Not on file  Stress: Not on file  Social Connections: Unknown (08/30/2021)   [facility]   Social Network    Social Network: Not on file   Additional Social History:   Sleep: Good  Appetite:  Good  Current Medications: Current Facility-Administered Medications  Medication Dose Route Frequency Provider Last Rate Last Admin   albuterol  (VENTOLIN  HFA) 108 (90 Base) MCG/ACT inhaler 2 puff  2 puff Inhalation Q6H PRN Nkwenti, Donia, NP   2 puff at 05/25/23 0913   alum & mag hydroxide-simeth (MAALOX/MYLANTA) 200-200-20 MG/5ML suspension 30 mL  30 mL Oral Q4H PRN Coleman, Carolyn H, NP   30 mL at 05/24/23 1445   amLODipine  (NORVASC ) tablet 5 mg  5 mg Oral Daily Nkwenti, Donia, NP   5 mg at 05/27/23 0818   apixaban  (ELIQUIS ) tablet 5 mg  5 mg Oral  BID Tex Drilling, NP   5 mg at 05/27/23 9181   haloperidol  (HALDOL ) tablet 5 mg  5 mg Oral TID PRN Mardy Elveria DEL, NP       And   diphenhydrAMINE  (BENADRYL ) capsule 50 mg  50 mg Oral TID PRN Mardy Elveria DEL, NP       haloperidol  lactate (HALDOL ) injection 5 mg  5 mg Intramuscular TID PRN Mardy Elveria DEL, NP       And   diphenhydrAMINE  (BENADRYL ) injection 50 mg  50 mg Intramuscular TID PRN Mardy Elveria DEL, NP       And   LORazepam  (ATIVAN ) injection 2 mg  2 mg Intramuscular TID PRN Coleman, Carolyn H, NP       haloperidol  lactate (HALDOL ) injection 10 mg  10 mg Intramuscular TID PRN Mardy Elveria DEL, NP       And   diphenhydrAMINE  (BENADRYL ) injection 50 mg  50 mg Intramuscular TID PRN Mardy Elveria DEL, NP       And   LORazepam  (ATIVAN ) injection 2 mg  2 mg Intramuscular TID PRN Mardy Elveria DEL, NP       FLUoxetine  (PROZAC ) capsule 10 mg  10 mg Oral Once Toia Micale I, NP       hydrOXYzine  (ATARAX ) tablet 25 mg  25 mg Oral TID PRN Coleman, Carolyn H, NP   25 mg at 05/27/23 0818   lidocaine  (LIDODERM ) 5 % 1 patch  1 patch Transdermal Q24H Onuoha, Chinwendu V, NP   1 patch at 05/26/23 2102   magnesium  hydroxide (MILK OF MAGNESIA) suspension 30 mL  30 mL Oral Daily PRN Coleman, Carolyn H, NP    30 mL at 05/24/23 1536   multivitamin with minerals tablet 1 tablet  1 tablet Oral Daily Tex Drilling, NP   1 tablet at 05/27/23 0818   pantoprazole  (PROTONIX ) EC tablet 40 mg  40 mg Oral Daily Ever Gustafson I, NP   40 mg at 05/27/23 0818   pregabalin  (LYRICA ) capsule 75 mg  75 mg Oral BID Onuoha, Chinwendu V, NP   75 mg at 05/27/23 0818   thiamine  (Vitamin B-1) tablet 100 mg  100 mg Oral Daily Nkwenti, Doris, NP   100 mg at 05/27/23 0818   Lab Results:  No results found for this or any previous visit (from the past 48 hours).  Blood Alcohol level:  Lab Results  Component Value Date   ETH <10 05/06/2023   ETH <10 03/01/2023   Metabolic Disorder Labs: Lab Results  Component Value Date   HGBA1C 5.7 (H) 05/07/2023   MPG 116.89 05/07/2023   MPG 119.76 03/08/2023   No results found for: PROLACTIN Lab Results  Component Value Date   CHOL 157 05/07/2023   TRIG 104 05/07/2023   HDL 45 05/07/2023   CHOLHDL 3.5 05/07/2023   VLDL 21 05/07/2023   LDLCALC 91 05/07/2023   LDLCALC 73 03/08/2023   Physical Findings: AIMS:  , ,  ,  ,    CIWA:  CIWA-Ar Total: 0 COWS:     Musculoskeletal: Strength & Muscle Tone: within normal limits Gait & Station:  Currently using walker to aid her ambulation/balance. Patient leans: N/A  Psychiatric Specialty Exam:  Presentation  General Appearance:  Appropriate for Environment; Casual; Fairly Groomed  Eye Contact: Good  Speech: Clear and Coherent; Normal Rate  Speech Volume: Normal  Handedness: Left  Mood and Affect  Mood: -- (Improving)  Affect: Appropriate; Congruent  Thought Process  Thought Processes: Coherent  Descriptions of Associations:Intact  Orientation:Full (Time, Place and Person)  Thought Content:Logical  History of Schizophrenia/Schizoaffective disorder:No  Duration of Psychotic Symptoms:Greater than six months  Hallucinations:Hallucinations: None Description of Command Hallucinations: NA Description  of Auditory Hallucinations: NA Description of Visual Hallucinations: NA  Ideas of Reference:None  Suicidal Thoughts:Suicidal Thoughts: No  Homicidal Thoughts:Homicidal Thoughts: No  Sensorium  Memory: Immediate Good; Recent Good; Remote Good  Judgment: Fair  Insight: Fair  Art Therapist  Concentration: Good  Attention Span: Fair  Recall: Good  Fund of Knowledge: Fair  Language: Good  Psychomotor Activity  Psychomotor Activity: Psychomotor Activity: Normal  Assets  Assets: Communication Skills; Desire for Improvement; Resilience; Social Support  Sleep  Sleep: Sleep: Good Number of Hours of Sleep: 6.25  Physical Exam: Physical Exam Vitals and nursing note reviewed.  Cardiovascular:     Rate and Rhythm: Normal rate.     Pulses: Normal pulses.  Pulmonary:     Effort: Pulmonary effort is normal.  Musculoskeletal:        General: Normal range of motion.     Comments: Currently ambulates with the aid of walker.  Skin:    General: Skin is warm and dry.  Neurological:     General: No focal deficit present.     Mental Status: She is oriented to person, place, and time.    Review of Systems  Constitutional:  Negative for chills and fever.  HENT:  Negative for congestion and sore throat.   Respiratory:  Negative for shortness of breath and wheezing.   Cardiovascular:  Negative for chest pain and palpitations.       Hx. CVA  Gastrointestinal:  Negative for abdominal pain, constipation, diarrhea, heartburn, nausea and vomiting.  Musculoskeletal:  Positive for back pain and myalgias.  Neurological:  Negative for dizziness, tingling, tremors, sensory change, speech change, focal weakness, seizures, loss of consciousness, weakness and headaches.  Endo/Heme/Allergies:        See the allergy lists.  Psychiatric/Behavioral:  Positive for depression, hallucinations and substance abuse (Hx cocaine use disorder.). Negative for memory loss and suicidal  ideas. The patient is nervous/anxious. The patient does not have insomnia.    Blood pressure 127/67, pulse 88, temperature (!) 97.5 F (36.4 C), temperature source Oral, resp. rate 20, height 4' 11 (1.499 m), weight 78.8 kg, last menstrual period 06/07/2020, SpO2 100%. Body mass index is 35.1 kg/m.  Treatment Plan Summary: Daily contact with patient to assess and evaluate symptoms and progress in treatment and Medication management.   Diagnoses Active Problems:   Hypertension   History of thrombosis   COPD (chronic obstructive pulmonary disease) (HCC)   Cocaine use disorder, severe, dependence (HCC)   Alcohol use disorder, mild, abuse   MDD (major depressive disorder), recurrent, severe, with psychosis (HCC)   Medications -Discontinued Celexa  as patient states that this medication is ineffective -decreased Prozac  to 10 mg mg after x once -Continue hydroxyzine  25 mg 3 times daily as needed for anxiety -Decreased Seroquel  to 50 mg nightly for sleep and mood stabilization x once -Discontinued Seroquel  25 mg daily in the mornings for anxiety -Continue Ativan  1 mg Q 6 H PRN for CIWAs >10 -Continue Agitation Protocol medications-See the [facility] for details: Ativan /Haldol /Benadryl  PRN    Meds for medical reasons: -Continue Eliquis  5 mg twice daily for thrombosis prophylaxis -Continue Norvasc  5 mg daily for hypertension -Continue Lyrica  75 mg twice daily for neuropathy -Continue albuterol  every 6 hours as needed for wheezing/SOB.  -Continue Protonix  40 mg po Q  am for GERD.   PRNS -Continue Tylenol  650 mg every 6 hours PRN for mild pain -Continue Maalox 30 mg every 4 hrs PRN for indigestion -Continue Milk of Magnesia as needed every 6 hrs for constipation   Discharge Planning: Social work and case management to assist with discharge planning and identification of hospital follow-up needs prior to discharge Estimated LOS: 5-7 days Discharge Concerns: Need to establish a safety plan;  Medication compliance and effectiveness Discharge Goals: Return home with outpatient referrals for mental health follow-up including medication management/psychotherapy   Mac Bolster, NP, pmhnp, fnp-bc. 05/27/2023, 2:01 PM Patient ID: Montie DELENA Domino, female   DOB: Nov 21, 1970, 53 y.o.   MRN: [id number] Patient ID: BURNA ATLAS, female   DOB: Jun 20, 1970, 53 y.o.   MRN: [id number] Patient ID: INGER WIEST, female   DOB: 01-Apr-1971, 53 y.o.   MRN: [id number]

## 2023-05-27 NOTE — [facility] Group Notes (Signed)
 [facility] Group Notes:  (Nursing/MHT/Case Management/Adjunct)  Date:  05/27/2023  Time:  8:45 PM  Type of Therapy:   Wrap-up group  Participation Level:  Did Not Attend  Participation Quality:    Affect:    Cognitive:    Insight:    Engagement in Group:    Modes of Intervention:    Summary of Progress/Problems: Pt refused to attend group.   Grayce LITTIE Essex 05/27/2023, 8:45 PM

## 2023-05-27 NOTE — Plan of Care (Signed)
  Problem: Education: Goal: Mental status will improve Outcome: Progressing   Problem: Health Behavior/Discharge Planning: Goal: Compliance with treatment plan for underlying cause of condition will improve Outcome: Progressing   Problem: Safety: Goal: Periods of time without injury will increase Outcome: Progressing   

## 2023-05-28 ENCOUNTER — Ambulatory Visit (HOSPITAL_COMMUNITY): Payer: 59 | Admitting: Family

## 2023-05-28 ENCOUNTER — Encounter (HOSPITAL_COMMUNITY): Payer: Self-pay

## 2023-05-28 DIAGNOSIS — F322 Major depressive disorder, single episode, severe without psychotic features: Secondary | ICD-10-CM | POA: Diagnosis not present

## 2023-05-28 MED ORDER — HYDROXYZINE HCL 25 MG PO TABS: 25.0000 mg | ORAL_TABLET | Freq: Three times a day (TID) | ORAL | 0 refills | Status: DC | PRN

## 2023-05-28 MED ORDER — MELATONIN 5 MG PO TABS
10.0000 mg | ORAL_TABLET | Freq: Every day | ORAL | Status: DC
Start: 2023-05-28 — End: 2023-05-29
  Administered 2023-05-28: 10 mg via ORAL
  Filled 2023-05-28 (×3): qty 2

## 2023-05-28 MED ORDER — PANTOPRAZOLE SODIUM 40 MG PO TBEC: 40.0000 mg | DELAYED_RELEASE_TABLET | Freq: Every day | ORAL | 0 refills | Status: DC

## 2023-05-28 MED ORDER — AMLODIPINE BESYLATE 5 MG PO TABS: 5.0000 mg | ORAL_TABLET | Freq: Every day | ORAL | 0 refills | Status: DC

## 2023-05-28 MED ORDER — WHITE PETROLATUM EX OINT
TOPICAL_OINTMENT | CUTANEOUS | Status: AC
  Filled 2023-05-28: qty 5

## 2023-05-28 MED ORDER — ALBUTEROL SULFATE HFA 108 (90 BASE) MCG/ACT IN AERS: 2.0000 | INHALATION_SPRAY | Freq: Four times a day (QID) | RESPIRATORY_TRACT | 0 refills | Status: DC | PRN

## 2023-05-28 MED ORDER — PREGABALIN 75 MG PO CAPS: 75.0000 mg | ORAL_CAPSULE | Freq: Two times a day (BID) | ORAL | 0 refills | Status: DC

## 2023-05-28 MED ORDER — APIXABAN 5 MG PO TABS: 5.0000 mg | ORAL_TABLET | Freq: Two times a day (BID) | ORAL | 0 refills | Status: DC

## 2023-05-28 MED ORDER — VITAMIN B-1 100 MG PO TABS
100.0000 mg | ORAL_TABLET | Freq: Every day | ORAL | 0 refills | Status: DC
Start: 2023-05-29 — End: 2023-05-29

## 2023-05-28 MED ORDER — LIDOCAINE 5 % EX PTCH: 1.0000 | MEDICATED_PATCH | CUTANEOUS | 0 refills | Status: DC

## 2023-05-28 NOTE — Progress Notes (Addendum)
  [facility] Adult Case Management Discharge Plan :  Will you be returning to the same living situation after discharge:  No. Pt will be going to [facility] program in [place].  At discharge, do you have transportation home?: Yes,  CSW arranged [employer] taxi to the [employer] for 8:15AM and [employer] bus ticket to depart [place] at 9:10AM and arrive in [place], [place] at 10:20PM. Pt will be picked up by [employer] from admissions and brought to [facility] facility in [place].  Do you have the ability to pay for your medications: Yes,  pt has active [facility] and will be given scripts at d/c.  Release of information consent forms completed and in the chart;  Patient's signature needed at discharge.  Patient to Follow up at:  Follow-up Information     [facility] Follow up on 06/20/2023.   Why: You have an appointment for medication management services on  06/20/23 at 10:00 am.  The appointment will be Virtual, but you may switch to in person. Contact information: [address] [place] [address] [phone]         Services, [facility] Follow up.   Why: You may also go to this provider for group therapy and/or medication management services if returning to [facility] after [facility] program. Contact information: [address] [place] [address] [phone]         [facility]. Call.   Why: Please call this provider to personally schedule an appointment for therapy services if you return to [facility] after [facility] program. Contact information: [address] [place], [place] [address] [phone]         [facility]. Go on 05/29/2023.   Why: You have been accepted into [facility] 37-month treatment program in [place], [place]. You will take a [employer] Taxi to the [employer] bus depot and leave [place] at 9:10AM via [employer] (ticket provided  to you at time of discharge). You will arrive in [place], [place] at 10:20PM. Moira Andrews [phone]) will pick you up when you arrive and you will begin substance use treatment. You will receive therapy and medications while in treatment. Contact information: [facility] [address] [place], [place] [address]  Phone: [phone] or [phone]                Next level of care provider has access to [facility] Link:no  Safety Planning and Suicide Prevention discussed: Yes,  Payton Barnette friend [phone]  AND completed with pt, SPE brochure given to pt    Has patient been referred to the Quitline?: Patient refused referral for treatment  Patient has been referred for addiction treatment: Yes, pt will be discharging to [facility] 12 month program in [place], [place].  Vonzell Guerin, LCSWA 05/28/2023, 3:47 PM

## 2023-05-28 NOTE — [facility] Suicide Risk Assessment (Signed)
Suicide Risk Assessment  Discharge Assessment    [facility] Discharge Suicide Risk Assessment   Principal Problem: MDD (major depressive disorder), severe (HCC) Discharge Diagnoses: Active Problems:   Hypertension   History of thrombosis   COPD (chronic obstructive pulmonary disease) (HCC)   Cocaine use disorder, severe, dependence (HCC)   Alcohol use disorder, mild, abuse   MDD (major depressive disorder), recurrent, severe, with psychosis (HCC)  HPI:  Patient is a 53 yo AAF with a prior mental health history of cocaine dependence, MDD, and GAD who presented to the [facility] ER on 2/2 for evaluation of global throbbing headaches, intermittent epistaxis, subjective fevers & chills at home. While being worked up medically, pt reported depressive symptoms secondary to financial stressors & homelessness. She reported being Suicidal with plan to walk in front of a car if she could not obtain housing. Pt was subsequently transferred to this [facility] on 2/4 after being medically cleared, for treatment and stabilization of her mental health status.   Hospital Course: During the patient's hospitalization, patient had extensive initial psychiatric evaluation, and follow-up psychiatric evaluations every day. Psychiatric diagnoses provided upon initial assessment: As listed above. Patient's psychiatric medications were adjusted on admission: As Follows: -Discontinue Celexa as patient states that this medication is ineffective -Start Prozac 10 mg today and increase to 20 mg after 2 doses -Start hydroxyzine 25 mg 3 times daily as needed for anxiety -Continue Seroquel 100 mg nightly for sleep and mood stabilization -Start Seroquel 25 mg daily in the mornings for anxiety -Start Ativan 1 mg Q 6 H PRN for CIWAs >10  During the hospitalization, other adjustments were made to the patient's psychiatric medication regimen. Patient was weaned off all Psychotropic medications with the exception of Hydroxyzine for  anxiety. This is the requirement of the receiving facility in [place] where patient is going on 2/11 for substance abuse treatment.  Medications at discharge are as follows: -Continue hydroxyzine 25 mg 3 times daily as needed for anxiety   Meds for medical reasons: -Continue Eliquis 5 mg twice daily for thrombosis prophylaxis -Continue Norvasc 5 mg daily for hypertension -Continue Lyrica 75 mg twice daily for neuropathy -Continue albuterol every 6 hours as needed for wheezing/SOB.  -Continue Protonix 40 mg po Q am for GERD.  Patient's care was discussed during the interdisciplinary team meeting every day during the hospitalization.The patient denies having side effects to prescribed psychiatric medication. Gradually, patient started adjusting to milieu. The patient was evaluated each day by a clinical provider to ascertain response to treatment. Improvement was noted by the patient's report of decreasing symptoms, improved sleep and appetite, affect, medication tolerance, behavior, and participation in unit programming.  Patient was asked each day to complete a self inventory noting mood, mental status, pain, new symptoms, anxiety and concerns.    Symptoms were reported as significantly decreased or resolved completely by discharge. On day of discharge, the patient reports that their mood is stable. The patient denied having suicidal thoughts for more than 48 hours prior to discharge.  Patient denies having homicidal thoughts.  Patient denies having auditory hallucinations.  Patient denies any visual hallucinations or other symptoms of psychosis. The patient was motivated to continue taking medication with a goal of continued improvement in mental health.   The patient reports their target psychiatric symptoms of depression, psychosis, anxiety and insomnia responded well to the psychiatric medications, and the patient reports overall benefit other psychiatric hospitalization. Supportive  psychotherapy was provided to the patient. The patient also  participated in regular group therapy while hospitalized. Coping skills, problem solving as well as relaxation therapies were also part of the unit programming.  Labs were reviewed with the patient, and abnormal results were discussed with the patient.  The patient is able to verbalize their individual safety plan to this provider.  # It is recommended to the patient to continue psychiatric medications as prescribed, after discharge from the hospital.    # It is recommended to the patient to follow up with your outpatient psychiatric provider and PCP.  # It was discussed with the patient, the impact of alcohol, drugs, tobacco have been there overall psychiatric and medical wellbeing, and total abstinence from substance use was recommended the patient.ed.  # Prescriptions provided or sent directly to preferred pharmacy at discharge. Patient agreeable to plan. Given opportunity to ask questions. Appears to feel comfortable with discharge.    # In the event of worsening symptoms, the patient is instructed to call the crisis hotline, 911 and or go to the nearest ED for appropriate evaluation and treatment of symptoms. To follow-up with primary care provider for other medical issues, concerns and or health care needs  # Patient was discharged to [facility] in [place] with a plan to follow up as noted below. Patient educated to ask the facility to guidance with following up with her medical problems once there, and verbalizes understanding.  Total Time spent with patient: 45 minutes  Musculoskeletal: Strength & Muscle Tone: decreased Gait & Station: unsteady Patient leans: N/A  Psychiatric Specialty Exam  Presentation  General Appearance:  Fairly Groomed  Eye Contact: Fair  Speech: Clear and Coherent  Speech Volume: Normal  Handedness: Right   Mood and Affect  Mood: Depressed;  Anxious  Duration of Depression Symptoms: Greater than two weeks  Affect: Congruent   Thought Process  Thought Processes: Coherent  Descriptions of Associations:Intact  Orientation:Full (Time, Place and Person)  Thought Content:Logical  History of Schizophrenia/Schizoaffective disorder:No  Duration of Psychotic Symptoms:Greater than six months  Hallucinations:Hallucinations: None Description of Command Hallucinations: NA Description of Auditory Hallucinations: NA Description of Visual Hallucinations: NA  Ideas of Reference:None  Suicidal Thoughts:Suicidal Thoughts: No  Homicidal Thoughts:Homicidal Thoughts: No   Sensorium  Memory: Immediate Fair  Judgment: Fair  Insight: Fair   Art therapist  Concentration: Fair  Attention Span: Fair  Recall: Fair  Fund of Knowledge: Fair  Language: Good  Psychomotor Activity  Psychomotor Activity: Psychomotor Activity: Normal  Assets  Assets: Resilience   Sleep  Sleep: Sleep: Fair Number of Hours of Sleep: 6.25   Physical Exam: Physical Exam Constitutional:      Appearance: Normal appearance.  Neurological:     Mental Status: She is alert.  Psychiatric:        Mood and Affect: Mood normal.        Behavior: Behavior normal.        Thought Content: Thought content normal.        Judgment: Judgment normal.    Review of Systems  Psychiatric/Behavioral:  Positive for depression (Denies SI/HI, denies plan or intent to harm self or others) and substance abuse (Motivated to go to rehab). Negative for hallucinations, memory loss and suicidal ideas. The patient is nervous/anxious (Stable) and has insomnia (Stable).   All other systems reviewed and are negative.  Blood pressure 124/85, pulse 85, temperature (!) 97.5 F (36.4 C), temperature source Oral, resp. rate 14, height 4\' 11"  (1.499 m), weight 78.8 kg, last menstrual  period 06/07/2020, SpO2 100%. Body mass index is 35.1 kg/m.  Mental  Status Per Nursing Assessment::   On Admission:  Suicidal ideation indicated by patient  Demographic Factors:  Low socioeconomic status and Unemployed  Loss Factors: Decrease in vocational status, Decline in physical health, and Financial problems/change in socioeconomic status  Historical Factors: Family history of mental illness or substance abuse  Risk Reduction Factors:   Sense of responsibility to family and Religious beliefs about death  Continued Clinical Symptoms:  Alcohol/Substance Abuse/Dependencies Previous Psychiatric Diagnoses and Treatments  Cognitive Features That Contribute To Risk:  None    Suicide Risk:  Mild:  There are no identifiable suicide plans, no associated intent, mild dysphoria and related symptoms, good self-control (both objective and subjective assessment), few other risk factors, and identifiable protective factors, including available and accessible social support.    Follow-up Information     [facility] Follow up on 06/20/2023.   Why: You have an appointment for medication management services on  06/20/23 at 10:00 am.  The appointment will be Virtual, but you may switch to in person. Contact information: [address] [place] [address] [phone]         Services, [facility] Follow up.   Why: You may also go to this provider for group therapy and/or medication management services if returning to [facility] after [facility] program. Contact information: [address] [place] [address] [phone]         [facility]. Call.   Why: Please call this provider to personally schedule an appointment for therapy services if you return to [facility] after [facility] program. Contact information: [address] [place], [place] [address] [phone]         [facility]. Go on 05/29/2023.   Why: You have been  accepted into [facility] treatment program in [place], [place]. You will take a [employer] to the [employer] bus depot and leave [place] at 9:10AM via [employer] (ticket provided to you at time of discharge). You will arrive in [place], [place] at 10:20PM. Marcelino Duster [phone]) will pick you up when you arrive and you will begin substance use treatment. You will receive therapy and medications while in treatment. Contact information: [facility] [address] [place], [place] [address]  Phone: [phone] or [phone]               Starleen Blue, NP 05/28/2023, 6:05 PM

## 2023-05-28 NOTE — Progress Notes (Signed)
   05/28/23 0000  Psych Admission Type (Psych Patients Only)  Admission Status Involuntary  Psychosocial Assessment  Patient Complaints Isolation;Anxiety;Depression  Eye Contact Fair  Facial Expression Animated  Affect Appropriate to circumstance  Speech Logical/coherent  Interaction Assertive  Motor Activity Slow  Appearance/Hygiene Unremarkable  Behavior Characteristics Cooperative;Appropriate to situation  Mood Pleasant  Thought Process  Coherency WDL  Content WDL  Delusions None reported or observed  Perception WDL  Hallucination None reported or observed  Judgment Poor  Confusion None  Danger to Self  Current suicidal ideation? Denies  Description of Suicide Plan none  Self-Injurious Behavior No self-injurious ideation or behavior indicators observed or expressed   Agreement Not to Harm Self Yes  Description of Agreement verbal  Danger to Others  Danger to Others None reported or observed  Danger to Others Abnormal  Harmful Behavior to others No threats or harm toward other people  Destructive Behavior No threats or harm toward property

## 2023-05-28 NOTE — Transportation (Signed)
 05/28/2023  Kathryn [name] DOB: April 04, 1971 MRN: [id number]   RIDER WAIVER AND RELEASE OF LIABILITY  For the purposes of helping with transportation needs, [facility] partners with outside transportation providers (taxi companies, [place], Catering manager.) to give [place] patients or other approved people the choice of on-demand rides [facility]") to our buildings for non-emergency visits.  By using [employer], I, the person signing this document, on behalf of myself and/or any legal minors (in my care using the [employer]), agree:  Science writer given to me are supplied by independent, outside transportation providers who do not work for, or have any affiliation with, [employer]. [facility] is not a transportation company. [facility] has no control over the quality or safety of the rides I get using [employer]. [facility] has no control over whether any outside ride will happen on time or not. [facility] gives no guarantee on the reliability, quality, safety, or availability on any rides, or that no mistakes will happen. I know and accept that traveling by vehicle (car, truck, SVU, [employer]oy[employer] Chi, bus, taxi, etc.) has risks of serious injuries such as disability, being paralyzed, and death. I know and agree the risk of using [employer] is mine alone, and not [employer]. Transport Services are provided "as is" and as are available. The transportation providers are in charge for all inspections and care of the vehicles used to provide these rides. I agree not to take legal action against , its agents, employees, officers, directors, representatives, insurers, attorneys, assigns, successors, subsidiaries, and affiliates at any time for any reasons related directly or indirectly to using [employer]. I also agree not to take legal action against  or its affiliates for any injury, death, or damage to property caused by or related to using  [employer]. I have read this Waiver and Release of Liability, and I understand the terms used in it and their legal meaning. This Waiver is freely and voluntarily given with the understanding that my right (or any legal minors) to legal action against  relating to [employer] is knowingly given up to use these services.   I attest that I read the Ride Waiver and Release of Liability to Kathryn [name], gave Ms. Cobert the opportunity to ask questions and answered the questions asked (if any). I affirm that Kathryn [name] then provided consent for assistance with transportation.

## 2023-05-28 NOTE — Progress Notes (Signed)
[facility] MD Progress Note  05/28/2023 4:27 PM Kathryn [name]  MRN:  [id number]  HPI:  Patient is a 53 yo AAF with a prior mental health history of cocaine dependence, MDD, and GAD who presented to the [facility] ER on 2/2 for evaluation of global throbbing headaches, intermittent epistaxis, subjective fevers & chills at home. While being worked up medically, pt reported depressive symptoms secondary to financial stressors & homelessness. She reported being Suicidal with plan to walk in front of a car if she could not obtain housing. Pt was subsequently transferred to this [facility] on 2/4 after being medically cleared, for treatment and stabilization of her mental health status.   24 hr chart review: Sleep Hours last night: 6.5 hrs as per nursing reports and documentation Nursing Concerns: none reported Behavioral episodes in the past 24 NWG:NFAO  Medication Compliance: compliant Vital Signs in the past 24 hrs: RN asked to check vitals for today PRN Medications in the past 24 hrs: Hydroxyzine  Patient assessment: Pt with a euthymic mood while sitting and watching tv with her peers, laughing, talking and interacting with no signs of distress. Called to talk to writer, and began exhibiting attention seeking behaviors, reporting chest pain, EKG completed with no changes from her baseline EKG noted. Complaining of AH, but there are no overt signs of psychosis. Does not seem to be responding to any internal or external stimuli.  Attention to personal hygiene and grooming is fair, eye contact is good, speech is clear & coherent. Thought contents are organized and logical, and pt currently denies SI/HI/VH or paranoia. There is no evidence of delusional thoughts.    Patient is scheduled to be discharged tomorrow, to a treatment program [place].  CSW has coordinated discharge planning including transportation to treatment facility.  We will plan to discharge patient tomorrow morning 2/11.  Patient is agreeable  to discharge.  As per attending psychiatrist, receiving facility is not receptive of sending patient to the facility with psychotropic medications, and patient has gradually been weaned off this medications, and is completely off Prozac and Seroquel currently.  We will plan to discharge patient tomorrow for her medications for medical reasons.  Principal Problem: MDD (major depressive disorder), severe (HCC)  Diagnosis: Active Problems:   Hypertension   History of thrombosis   COPD (chronic obstructive pulmonary disease) (HCC)   Cocaine use disorder, severe, dependence (HCC)   Alcohol use disorder, mild, abuse   MDD (major depressive disorder), recurrent, severe, with psychosis (HCC)  Total Time spent with patient:  45 minutes  Past Psychiatric History: See H&P.  Past Medical History:  Past Medical History:  Diagnosis Date   Anxiety    Arthritis    Asthma    Chronic back pain    COPD (chronic obstructive pulmonary disease) (HCC)    Chronic bronchitis   Depression    Dyspnea    GERD (gastroesophageal reflux disease)    Hypertension    Migraine    Neuropathy    Osteoarthritis of left knee, 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   Single subsegmental pulmonary embolism without acute cor pulmonale (HCC) 06/20/2021   Sleep apnea 04/2020   GETTING A cpap   Suicidal ideation 12/24/2018    Past Surgical History:  Procedure Laterality Date   BACK SURGERY     DILATATION AND CURETTAGE/HYSTEROSCOPY WITH MINERVA N/A 06/09/2020   Procedure: DILATATION AND CURETTAGE/HYSTEROSCOPY WITH MINERVA;  Surgeon: Lazaro Arms, MD;  Location: [facility];  Service: Gynecology;  Laterality: N/A;   ESOPHAGOGASTRODUODENOSCOPY (  EGD) WITH PROPOFOL N/A 12/25/2018   Procedure: ESOPHAGOGASTRODUODENOSCOPY (EGD) WITH PROPOFOL;  Surgeon: Malissa Hippo, MD;  Location: [facility];  Service: Endoscopy;  Laterality: N/A;   FLEXIBLE SIGMOIDOSCOPY  10/14/2021   Procedure: FLEXIBLE SIGMOIDOSCOPY;  Surgeon: Dolores Frame, MD;  Location: [facility];  Service: Gastroenterology;;   PATELLA-FEMORAL ARTHROPLASTY Left 10/08/2018   Procedure: PATELLA-FEMORAL ARTHROPLASTY;  Surgeon: Teryl Lucy, MD;  Location: [facility];  Service: Orthopedics;  Laterality: Left;   TUBAL LIGATION     Family History:  Family History  Problem Relation Age of Onset   Gout Paternal Grandfather    Cirrhosis Paternal Grandfather    Hypertension Paternal Grandmother    Aneurysm Paternal Grandmother    Cirrhosis Maternal Grandmother    Cirrhosis Maternal Grandfather    Cancer Father    Cirrhosis Father    Cirrhosis Mother    Breast cancer Sister    Hypertension Sister    Bronchitis Daughter    Bronchitis Daughter    Asthma Son    Bronchitis Son    Migraines Neg Hx    Family Psychiatric  History: See H&P.  Social History:  Social History   Substance and Sexual Activity  Alcohol Use Not Currently   Comment: states quit  06/29/22     Social History   Substance and Sexual Activity  Drug Use Yes   Types: Cocaine   Comment: last used 05/2022    Social History   Socioeconomic History   Marital status: Single    Spouse name: Not on file   Number of children: 3   Years of education: 10   Highest education level: 10th grade  Occupational History   Not on file  Tobacco Use   Smoking status: Former    Current packs/day: 1.00    Average packs/day: 1 pack/day for 24.2 years (24.2 ttl pk-yrs)    Types: Cigarettes    Start date: 03/26/1999    Passive exposure: Never   Smokeless tobacco: Never   Tobacco comments:    Smokes often, especially when using crack cocaine  Vaping Use   Vaping status: Never Used  Substance and Sexual Activity   Alcohol use: Not Currently    Comment: states quit  06/29/22   Drug use: Yes    Types: Cocaine    Comment: last used 05/2022   Sexual activity: Yes    Birth control/protection: Surgical    Comment: tubal, ablation  Other Topics Concern   Not on file  Social History  Narrative   R handed    Lives with boyfriend   1 Cup of caffeine daily    Social Drivers of Health   Financial Resource Strain: Not on file  Food Insecurity: Food Insecurity Present (05/22/2023)   Hunger Vital Sign    Worried About Running Out of Food in the Last Year: Sometimes true    Ran Out of Food in the Last Year: Sometimes true  Transportation Needs: No Transportation Needs (05/22/2023)   PRAPARE - Administrator, Civil Service (Medical): No    Lack of Transportation (Non-Medical): No  Recent Concern: Transportation Needs - Unmet Transportation Needs (03/26/2023)   PRAPARE - Administrator, Civil Service (Medical): Yes    Lack of Transportation (Non-Medical): Yes  Physical Activity: Not on file  Stress: Not on file  Social Connections: Unknown (08/30/2021)   Received from [facility]   Social Network    Social Network: Not on  file   Additional Social History:   Sleep: Good  Appetite:  Good  Current Medications: Current Facility-Administered Medications  Medication Dose Route Frequency Provider Last Rate Last Admin   albuterol (VENTOLIN HFA) 108 (90 Base) MCG/ACT inhaler 2 puff  2 puff Inhalation Q6H PRN Starleen Blue, NP   2 puff at 05/25/23 0913   alum & mag hydroxide-simeth (MAALOX/MYLANTA) 200-200-20 MG/5ML suspension 30 mL  30 mL Oral Q4H PRN Ardis Hughs, NP   30 mL at 05/24/23 1445   amLODipine (NORVASC) tablet 5 mg  5 mg Oral Daily Starleen Blue, NP   5 mg at 05/28/23 3086   apixaban (ELIQUIS) tablet 5 mg  5 mg Oral BID Starleen Blue, NP   5 mg at 05/28/23 5784   haloperidol (HALDOL) tablet 5 mg  5 mg Oral TID PRN Ardis Hughs, NP       And   diphenhydrAMINE (BENADRYL) capsule 50 mg  50 mg Oral TID PRN Ardis Hughs, NP       haloperidol lactate (HALDOL) injection 5 mg  5 mg Intramuscular TID PRN Ardis Hughs, NP       And   diphenhydrAMINE (BENADRYL) injection 50 mg  50 mg Intramuscular TID PRN  Ardis Hughs, NP       And   LORazepam (ATIVAN) injection 2 mg  2 mg Intramuscular TID PRN Ardis Hughs, NP       haloperidol lactate (HALDOL) injection 10 mg  10 mg Intramuscular TID PRN Ardis Hughs, NP       And   diphenhydrAMINE (BENADRYL) injection 50 mg  50 mg Intramuscular TID PRN Ardis Hughs, NP       And   LORazepam (ATIVAN) injection 2 mg  2 mg Intramuscular TID PRN Ardis Hughs, NP       FLUoxetine (PROZAC) capsule 10 mg  10 mg Oral Once Armandina Stammer I, NP       hydrOXYzine (ATARAX) tablet 25 mg  25 mg Oral TID PRN Ardis Hughs, NP   25 mg at 05/28/23 1526   lidocaine (LIDODERM) 5 % 1 patch  1 patch Transdermal Q24H Armandina Stammer I, NP   1 patch at 05/28/23 1421   magnesium hydroxide (MILK OF MAGNESIA) suspension 30 mL  30 mL Oral Daily PRN Ardis Hughs, NP   30 mL at 05/24/23 1536   multivitamin with minerals tablet 1 tablet  1 tablet Oral Daily Starleen Blue, NP   1 tablet at 05/28/23 0916   pantoprazole (PROTONIX) EC tablet 40 mg  40 mg Oral Daily Armandina Stammer I, NP   40 mg at 05/28/23 0916   pregabalin (LYRICA) capsule 75 mg  75 mg Oral BID Onuoha, Chinwendu V, NP   75 mg at 05/28/23 1626   thiamine (Vitamin B-1) tablet 100 mg  100 mg Oral Daily Starleen Blue, NP   100 mg at 05/28/23 6962   Lab Results:  No results found for this or any previous visit (from the past 48 hours).  Blood Alcohol level:  Lab Results  Component Value Date   ETH <10 05/06/2023   ETH <10 03/01/2023   Metabolic Disorder Labs: Lab Results  Component Value Date   HGBA1C 5.7 (H) 05/07/2023   MPG 116.89 05/07/2023   MPG 119.76 03/08/2023   No results found for: "PROLACTIN" Lab Results  Component Value Date   CHOL 157 05/07/2023   TRIG 104 05/07/2023   HDL 45 05/07/2023  CHOLHDL 3.5 05/07/2023   VLDL 21 05/07/2023   LDLCALC 91 05/07/2023   LDLCALC 73 03/08/2023   Physical Findings: AIMS:  , ,  ,  ,    CIWA:  CIWA-Ar Total: 0 COWS:      Musculoskeletal: Strength & Muscle Tone: within normal limits Gait & Station:  Currently using walker to aid her ambulation/balance. Patient leans: N/A  Psychiatric Specialty Exam:  Presentation  General Appearance:  Fairly Groomed  Eye Contact: Fair  Speech: Clear and Coherent  Speech Volume: Normal  Handedness: Right  Mood and Affect  Mood: Depressed; Anxious  Affect: Congruent  Thought Process  Thought Processes: Coherent  Descriptions of Associations:Intact  Orientation:Full (Time, Place and Person)  Thought Content:Logical  History of Schizophrenia/Schizoaffective disorder:No  Duration of Psychotic Symptoms:Greater than six months  Hallucinations:Hallucinations: None Description of Command Hallucinations: NA Description of Auditory Hallucinations: NA Description of Visual Hallucinations: NA  Ideas of Reference:None  Suicidal Thoughts:Suicidal Thoughts: No  Homicidal Thoughts:Homicidal Thoughts: No  Sensorium  Memory: Immediate Fair  Judgment: Fair  Insight: Fair  Art therapist  Concentration: Fair  Attention Span: Fair  Recall: Fair  Fund of Knowledge: Fair  Language: Good  Psychomotor Activity  Psychomotor Activity: Psychomotor Activity: Normal  Assets  Assets: Resilience  Sleep  Sleep: Sleep: Fair Number of Hours of Sleep: 6.25  Physical Exam: Physical Exam Vitals and nursing note reviewed.  Cardiovascular:     Rate and Rhythm: Normal rate.     Pulses: Normal pulses.  Pulmonary:     Effort: Pulmonary effort is normal.  Musculoskeletal:        General: Normal range of motion.     Comments: Currently ambulates with the aid of walker.  Skin:    General: Skin is warm and dry.  Neurological:     General: No focal deficit present.     Mental Status: She is oriented to person, place, and time.    Review of Systems  Constitutional:  Negative for chills and fever.  HENT:  Negative for congestion  and sore throat.   Respiratory:  Negative for shortness of breath and wheezing.   Cardiovascular:  Negative for chest pain and palpitations.       Hx. CVA  Gastrointestinal:  Negative for abdominal pain, constipation, diarrhea, heartburn, nausea and vomiting.  Musculoskeletal:  Positive for back pain and myalgias.  Neurological:  Negative for dizziness, tingling, tremors, sensory change, speech change, focal weakness, seizures, loss of consciousness, weakness and headaches.  Endo/Heme/Allergies:        See the allergy lists.  Psychiatric/Behavioral:  Positive for depression, hallucinations and substance abuse (Hx cocaine use disorder.). Negative for memory loss and suicidal ideas. The patient is nervous/anxious. The patient does not have insomnia.    Blood pressure 124/85, pulse 85, temperature (!) 97.5 F (36.4 C), temperature source Oral, resp. rate 14, height 4\' 11"  (1.499 m), weight 78.8 kg, last menstrual period 06/07/2020, SpO2 100%. Body mass index is 35.1 kg/m.  Treatment Plan Summary: Daily contact with patient to assess and evaluate symptoms and progress in treatment and Medication management.   Diagnoses Active Problems:   Hypertension   History of thrombosis   COPD (chronic obstructive pulmonary disease) (HCC)   Cocaine use disorder, severe, dependence (HCC)   Alcohol use disorder, mild, abuse   MDD (major depressive disorder), recurrent, severe, with psychosis (HCC)   Medications -Discontinued Celexa on admission due to ineffectiveness -Previously dc'd Prozac 10 mg mg daily for MDD -Continue hydroxyzine 25 mg  3 times daily as needed for anxiety -Previously discontinued Seroquel 50 mg nightly for sleep and mood  -Previously discontinued Seroquel 25 mg daily in the mornings for anxiety -Completed Ativan 1 mg Q 6 H PRN for CIWAs >10 -Continue Agitation Protocol medications-See the [facility] for details: Ativan/Haldol/Benadryl PRN    Meds for medical reasons: -Continue Eliquis  5 mg twice daily for thrombosis prophylaxis -Continue Norvasc 5 mg daily for hypertension -Continue Lyrica 75 mg twice daily for neuropathy -Continue albuterol every 6 hours as needed for wheezing/SOB.  -Continue Protonix 40 mg po Q am for GERD.   PRNS -Continue Tylenol 650 mg every 6 hours PRN for mild pain -Continue Maalox 30 mg every 4 hrs PRN for indigestion -Continue Milk of Magnesia as needed every 6 hrs for constipation   Discharge Planning: Social work and case management to assist with discharge planning and identification of hospital follow-up needs prior to discharge Estimated LOS: 5-7 days Discharge Concerns: Need to establish a safety plan; Medication compliance and effectiveness Discharge Goals: Return home with outpatient referrals for mental health follow-up including medication management/psychotherapy   Starleen Blue, NP, pmhnp. 05/28/2023, 4:27 PM Patient ID: Kathryn [name], female   DOB: 11-24-1970, 53 y.o.   MRN: [id number]

## 2023-05-28 NOTE — [facility] Group Notes (Signed)
 Spiritual care group on grief and loss facilitated by Chaplain Kathryn [name], [facility]  Group Goal: Support / Education around grief and loss  Members engage in facilitated group support and psycho-social education.  Group Description:  Following introductions and group rules, group members engaged in facilitated group dialogue and support around topic of loss, with particular support around experiences of loss in their lives. Group Identified types of loss (relationships / self / things) and identified patterns, circumstances, and changes that precipitate losses. Reflected on thoughts / feelings around loss, normalized grief responses, and recognized variety in grief experience. Group encouraged individual reflection on safe space and on the coping skills that they are already utilizing.  Group drew on Adlerian / Rogerian and narrative framework  Patient Progress: Kathryn [name] attended group and actively engaged and participated in group conversation and activities. Her faith is very important to her.  She was very supportive of other patients and became tearful at one point during group.

## 2023-05-28 NOTE — Progress Notes (Signed)
   05/28/23 2200  Psych Admission Type (Psych Patients Only)  Admission Status Involuntary  Psychosocial Assessment  Patient Complaints Anxiety;Depression;Other (Comment) ("My depression is less but anxiety is 10/10. I'm looking forward to discharge to rehab in [place].")  Eye Contact Fair  Facial Expression Animated  Affect Appropriate to circumstance  Speech Logical/coherent  Interaction Assertive  Motor Activity Slow  Appearance/Hygiene In hospital gown  Behavior Characteristics Cooperative;Other (Comment) (Participative, social in the milieu)  Mood Pleasant  Thought Process  Coherency WDL  Content WDL  Delusions None reported or observed  Perception WDL  Hallucination None reported or observed  Judgment Limited  Confusion None  Danger to Self  Current suicidal ideation? Denies  Self-Injurious Behavior No self-injurious ideation or behavior indicators observed or expressed   Agreement Not to Harm Self Yes  Description of Agreement Verbal  Danger to Others  Danger to Others None reported or observed  Danger to Others Abnormal  Harmful Behavior to others No threats or harm toward other people  Destructive Behavior No threats or harm toward property

## 2023-05-28 NOTE — Progress Notes (Signed)
   05/28/23 0916  Psych Admission Type (Psych Patients Only)  Admission Status Involuntary  Psychosocial Assessment  Patient Complaints Anxiety;Depression;Hopelessness  Eye Contact Fair  Facial Expression Animated  Affect Appropriate to circumstance  Speech Logical/coherent  Interaction Assertive  Motor Activity Slow  Appearance/Hygiene Unremarkable  Behavior Characteristics Cooperative  Mood Pleasant  Thought Process  Coherency WDL  Content WDL  Delusions None reported or observed  Perception WDL  Hallucination None reported or observed  Judgment Poor  Confusion None  Danger to Self  Current suicidal ideation? Denies  Agreement Not to Harm Self Yes  Description of Agreement verbal  Danger to Others  Danger to Others None reported or observed  Danger to Others Abnormal  Harmful Behavior to others No threats or harm toward other people  Destructive Behavior No threats or harm toward property

## 2023-05-28 NOTE — Group Note (Signed)
 Occupational Therapy Group Note  Group Topic: Sleep Hygiene  Group Date: 05/28/2023 Start Time: 1427 End Time: 1455 Facilitators: Lynnda Sas, OT   Group Description: Group encouraged increased participation and engagement through topic focused on sleep hygiene. Patients reflected on the quality of sleep they typically receive and identified areas that need improvement. Group was given background information on sleep and sleep hygiene, including common sleep disorders. Group members also received information on how to improve one's sleep and introduced a sleep diary as a tool that can be utilized to track sleep quality over a length of time. Group session ended with patients identifying one or more strategies they could utilize or implement into their sleep routine in order to improve overall sleep quality.        Therapeutic Goal(s):  Identify one or more strategies to improve overall sleep hygiene  Identify one or more areas of sleep that are negatively impacted (sleep too much, too little, etc)     Participation Level: Engaged   Participation Quality: Independent   Behavior: Appropriate   Speech/Thought Process: Loose association    Affect/Mood: Appropriate   Insight: Fair   Judgement: Fair      Modes of Intervention: Education  Patient Response to Interventions:  Attentive   Plan: Continue to engage patient in OT groups 2 - 3x/week.  05/28/2023  Lynnda Sas, OT  Nadav Swindell, OT

## 2023-05-28 NOTE — BH IP Treatment Plan (Signed)
 Interdisciplinary Treatment and Diagnostic Plan Update  05/28/2023 Time of Session: 11:35 AM - UPDATE  Kathryn [name] MRN: [id number]  Principal Diagnosis: MDD (major depressive disorder), severe (HCC)  Secondary Diagnoses: Active Problems:   Hypertension   History of thrombosis   COPD (chronic obstructive pulmonary disease) (HCC)   Cocaine use disorder, severe, dependence (HCC)   Alcohol use disorder, mild, abuse   MDD (major depressive disorder), recurrent, severe, with psychosis (HCC)   Current Medications:  Current Facility-Administered Medications  Medication Dose Route Frequency Provider Last Rate Last Admin   albuterol  (VENTOLIN  HFA) 108 (90 Base) MCG/ACT inhaler 2 puff  2 puff Inhalation Q6H PRN Robet Chiquito, NP   2 puff at 05/25/23 0913   alum & mag hydroxide-simeth (MAALOX/MYLANTA) 200-200-20 MG/5ML suspension 30 mL  30 mL Oral Q4H PRN Coleman, Carolyn H, NP   30 mL at 05/24/23 1445   amLODipine  (NORVASC ) tablet 5 mg  5 mg Oral Daily Robet Chiquito, NP   5 mg at 05/28/23 4098   apixaban  (ELIQUIS ) tablet 5 mg  5 mg Oral BID Robet Chiquito, NP   5 mg at 05/28/23 1191   haloperidol  (HALDOL ) tablet 5 mg  5 mg Oral TID PRN Costella Dirks, NP       And   diphenhydrAMINE  (BENADRYL ) capsule 50 mg  50 mg Oral TID PRN Coleman, Carolyn H, NP       haloperidol  lactate (HALDOL ) injection 5 mg  5 mg Intramuscular TID PRN Costella Dirks, NP       And   diphenhydrAMINE  (BENADRYL ) injection 50 mg  50 mg Intramuscular TID PRN Costella Dirks, NP       And   LORazepam  (ATIVAN ) injection 2 mg  2 mg Intramuscular TID PRN Costella Dirks, NP       haloperidol  lactate (HALDOL ) injection 10 mg  10 mg Intramuscular TID PRN Costella Dirks, NP       And   diphenhydrAMINE  (BENADRYL ) injection 50 mg  50 mg Intramuscular TID PRN Costella Dirks, NP       And   LORazepam  (ATIVAN ) injection 2 mg  2 mg Intramuscular TID PRN Costella Dirks, NP       FLUoxetine  (PROZAC ) capsule  10 mg  10 mg Oral Once Nwoko, Agnes I, NP       hydrOXYzine  (ATARAX ) tablet 25 mg  25 mg Oral TID PRN Coleman, Carolyn H, NP   25 mg at 05/27/23 2124   lidocaine  (LIDODERM ) 5 % 1 patch  1 patch Transdermal Q24H Asuncion Layer I, NP   1 patch at 05/27/23 1409   magnesium  hydroxide (MILK OF MAGNESIA) suspension 30 mL  30 mL Oral Daily PRN Coleman, Carolyn H, NP   30 mL at 05/24/23 1536   multivitamin with minerals tablet 1 tablet  1 tablet Oral Daily Robet Chiquito, NP   1 tablet at 05/28/23 4782   pantoprazole  (PROTONIX ) EC tablet 40 mg  40 mg Oral Daily Nwoko, Agnes I, NP   40 mg at 05/28/23 9562   pregabalin  (LYRICA ) capsule 75 mg  75 mg Oral BID Onuoha, Chinwendu V, NP   75 mg at 05/28/23 1308   thiamine  (Vitamin B-1) tablet 100 mg  100 mg Oral Daily Nkwenti, Doris, NP   100 mg at 05/28/23 6578   PTA Medications: Medications Prior to Admission  Medication Sig Dispense Refill Last Dose/Taking   albuterol  (VENTOLIN  HFA) 108 (90 Base) MCG/ACT inhaler Inhale 2 puffs into the  lungs every 6 (six) hours as needed for wheezing or shortness of breath.      amLODipine  (NORVASC ) 5 MG tablet Take 1 tablet (5 mg total) by mouth daily for 10 days. (Patient not taking: Reported on 05/22/2023) 10 tablet 0    apixaban  (ELIQUIS ) 5 MG TABS tablet Take 1 tablet (5 mg total) by mouth 2 (two) times daily. 60 tablet 0    atorvastatin  (LIPITOR) 40 MG tablet Take 1 tablet (40 mg total) by mouth daily. 30 tablet 0    BREO ELLIPTA  200-25 MCG/ACT AEPB Inhale 2 puffs into the lungs 2 (two) times daily.      citalopram  (CELEXA ) 20 MG tablet Take 20 mg by mouth daily.      cyanocobalamin  (VITAMIN B12) 1000 MCG tablet Take 1 tablet (1,000 mcg total) by mouth daily. 30 tablet 0    docusate sodium  (COLACE) 100 MG capsule Take 1 capsule (100 mg total) by mouth 2 (two) times daily. 10 capsule 0    fluticasone  furoate-vilanterol (BREO ELLIPTA ) 200-25 MCG/ACT AEPB Inhale 1 puff into the lungs daily. 180 each 10    folic acid  (FOLVITE )  1 MG tablet Take 1 tablet (1 mg total) by mouth daily. 30 tablet 0    hydrochlorothiazide  (HYDRODIURIL ) 25 MG tablet Take 25 mg by mouth daily.      losartan  (COZAAR ) 25 MG tablet Take 12.5 mg by mouth daily.      Multiple Vitamin (MULTIVITAMIN WITH MINERALS) TABS tablet Take 1 tablet by mouth daily. 30 tablet 0    polyethylene glycol powder (GLYCOLAX /MIRALAX ) 17 GM/SCOOP powder Take 1 capful (17 g) by mouth daily. 238 g 0    pregabalin  (LYRICA ) 200 MG capsule Take 200 mg by mouth in the morning and at bedtime.      QUEtiapine  (SEROQUEL ) 300 MG tablet Take 300 mg by mouth at bedtime.       Patient Stressors:    Patient Strengths:    Treatment Modalities: Medication Management, Group therapy, Case management,  1 to 1 session with clinician, Psychoeducation, Recreational therapy.   Physician Treatment Plan for Primary Diagnosis: MDD (major depressive disorder), severe (HCC) Long Term Goal(s): Improvement in symptoms so as ready for discharge   Short Term Goals: Ability to identify changes in lifestyle to reduce recurrence of condition will improve Ability to verbalize feelings will improve Ability to disclose and discuss suicidal ideas Ability to demonstrate self-control will improve Ability to identify and develop effective coping behaviors will improve Ability to maintain clinical measurements within normal limits will improve Compliance with prescribed medications will improve Ability to identify triggers associated with substance abuse/mental health issues will improve  Medication Management: Evaluate patient's response, side effects, and tolerance of medication regimen.  Therapeutic Interventions: 1 to 1 sessions, Unit Group sessions and Medication administration.  Evaluation of Outcomes: Progressing  Physician Treatment Plan for Secondary Diagnosis: Active Problems:   Hypertension   History of thrombosis   COPD (chronic obstructive pulmonary disease) (HCC)   Cocaine use  disorder, severe, dependence (HCC)   Alcohol use disorder, mild, abuse   MDD (major depressive disorder), recurrent, severe, with psychosis (HCC)  Long Term Goal(s): Improvement in symptoms so as ready for discharge   Short Term Goals: Ability to identify changes in lifestyle to reduce recurrence of condition will improve Ability to verbalize feelings will improve Ability to disclose and discuss suicidal ideas Ability to demonstrate self-control will improve Ability to identify and develop effective coping behaviors will improve Ability to maintain clinical measurements  within normal limits will improve Compliance with prescribed medications will improve Ability to identify triggers associated with substance abuse/mental health issues will improve     Medication Management: Evaluate patient's response, side effects, and tolerance of medication regimen.  Therapeutic Interventions: 1 to 1 sessions, Unit Group sessions and Medication administration.  Evaluation of Outcomes: Progressing   RN Treatment Plan for Primary Diagnosis: MDD (major depressive disorder), severe (HCC) Long Term Goal(s): Knowledge of disease and therapeutic regimen to maintain health will improve  Short Term Goals: Ability to remain free from injury will improve, Ability to verbalize frustration and anger appropriately will improve, Ability to participate in decision making will improve, Ability to verbalize feelings will improve, Ability to identify and develop effective coping behaviors will improve, and Compliance with prescribed medications will improve  Medication Management: RN will administer medications as ordered by provider, will assess and evaluate patient's response and provide education to patient for prescribed medication. RN will report any adverse and/or side effects to prescribing provider.  Therapeutic Interventions: 1 on 1 counseling sessions, Psychoeducation, Medication administration, Evaluate  responses to treatment, Monitor vital signs and CBGs as ordered, Perform/monitor CIWA, COWS, AIMS and Fall Risk screenings as ordered, Perform wound care treatments as ordered.  Evaluation of Outcomes: Progressing   LCSW Treatment Plan for Primary Diagnosis: MDD (major depressive disorder), severe (HCC) Long Term Goal(s): Safe transition to appropriate next level of care at discharge, Engage patient in therapeutic group addressing interpersonal concerns.  Short Term Goals: Engage patient in aftercare planning with referrals and resources, Increase ability to appropriately verbalize feelings, Facilitate acceptance of mental health diagnosis and concerns, and Identify triggers associated with mental health/substance abuse issues  Therapeutic Interventions: Assess for all discharge needs, 1 to 1 time with Social worker, Explore available resources and support systems, Assess for adequacy in community support network, Educate family and significant other(s) on suicide prevention, Complete Psychosocial Assessment, Interpersonal group therapy.  Evaluation of Outcomes: Progressing   Progress in Treatment: Attending groups: Yes. Participating in groups: Yes. Taking medication as prescribed: Yes. Toleration medication: Yes. Family/Significant other contact made: Yes, will contact:  Payton Barnette (friend), [phone] Patient understands diagnosis: Yes. Discussing patient identified problems/goals with staff: Yes. Medical problems stabilized or resolved: Yes. Denies suicidal/homicidal ideation: Yes. Issues/concerns per patient self-inventory: No.   New problem(s) identified: No, Describe:  pt requested [employer] and [employer] customer service number to cancel cards   New Short Term/Long Term Goal(s): detox, medication management for mood stabilization; elimination of SI thoughts; development of comprehensive mental wellness/sobriety plan     Patient Goals:  "I need to stabilize my moods and get on  my medications. And go to treatment for cocaine"   Discharge Plan or Barriers: Patient recently admitted. CSW will continue to follow and assess for appropriate referrals and possible discharge planning.      Reason for Continuation of Hospitalization: Anxiety Depression Medication stabilization   Estimated Length of Stay: 1 - 2 days  Last 3 [place] Suicide Severity Risk Score: Flowsheet Row Ad[facility] (Current) [facility]21/2023 [facility] ED from 05/20/2023 in [facility] Department at [facility]  C-SSRS RISK CATEGORY Low Risk No Risk No Risk       Last PHQ 2/9 Scores:    02/02/2022    9:20 AM 12/20/2021   11:26 AM 11/03/2021    9:10 AM  Depression screen PHQ 2/9  Decreased Interest  0 3 0  Down, Depressed, Hopeless 0 3 0  PHQ - 2 Score 0 6 0  Altered sleeping 0 3   Tired, decreased energy 0 3   Change in appetite 0 3   Feeling bad or failure about yourself  0 3   Trouble concentrating 0 3   Moving slowly or fidgety/restless 0 0   Suicidal thoughts 0 3   PHQ-9 Score 0 24   Difficult doing work/chores Not difficult at all Very difficult     Scribe for Treatment Team: Zeus Marquis O Wendelin Bradt, LCSWA 05/28/2023 11:57 AM

## 2023-05-28 NOTE — Plan of Care (Signed)
  Problem: Education: Goal: Emotional status will improve Outcome: Adequate for Discharge Goal: Mental status will improve Outcome: Adequate for Discharge   Problem: Activity: Goal: Interest or engagement in activities will improve Outcome: Adequate for Discharge Goal: Sleeping patterns will improve Outcome: Adequate for Discharge   Problem: Coping: Goal: Ability to verbalize frustrations and anger appropriately will improve Outcome: Adequate for Discharge Goal: Ability to demonstrate self-control will improve Outcome: Adequate for Discharge   Problem: Health Behavior/Discharge Planning: Goal: Identification of resources available to assist in meeting health care needs will improve Outcome: Adequate for Discharge Goal: Compliance with treatment plan for underlying cause of condition will improve Outcome: Adequate for Discharge   Problem: Physical Regulation: Goal: Ability to maintain clinical measurements within normal limits will improve Outcome: Adequate for Discharge   Problem: Safety: Goal: Periods of time without injury will increase Outcome: Adequate for Discharge

## 2023-05-28 NOTE — Progress Notes (Signed)
     05/28/2023       1:17 PM   Aldine Humphreys [place]   Bus ticket for pt has been placed in the front of patients chart to be given at time of discharge. RN aware.  Signed:  Trease Bremner, LCSW-A 05/28/2023  1:17 PM

## 2023-05-28 NOTE — Progress Notes (Addendum)
     05/28/2023       11:43 AM   Adah Acron A Schult   Type of Note: [facility]   Pt was accepted into the [facility] program (location: [address], [place], [place], [address]) per Moira Andrews in admissions [phone]).   [employer] bus ticket was provided for pt to leave [place] bus depot tomorrow morning at 9:10AM and will arrive at [place] bus station tomorrow evening at [employer]. Called and confirmed with Moira Andrews this morning - Moira Andrews will pick pt up from [place] bus station tomorrow night. Copy of bus itinerary will be emailed to [place] as well. Pt aware and agreeable.  Pt must arrive with medication samples and scripts. MD are aware.   Signed:  Emmaleigh Longo, LCSW-A 05/28/2023  11:43 AM

## 2023-05-28 NOTE — Group Note (Signed)
 Recreation Therapy Group Note   Group Topic:Stress Management  Group Date: 05/28/2023 Start Time: 0935 End Time: 0954 Facilitators: Kandee Escalante-McCall, LRT,CTRS Location: [address] Dayroom   Group Topic: Stress Management   Goal Area(s) Addresses:  Patient will identify positive stress management techniques. Patient will identify benefits of using stress management post d/c.   Intervention: Insight Timer App   Activity: Meditation. LRT played a meditation that focused on reducing stress, anxiety and worry. Patients were to focus on their breathing and allow the meditation to relax and calm them in order to get the full affect of the meditation.     Education:  Stress Management, Discharge Planning.    Education Outcome: Acknowledges Education   Affect/Mood: Appropriate   Participation Level: Engaged   Participation Quality: Independent   Behavior: Appropriate   Speech/Thought Process: Focused   Insight: Good   Judgement: Good   Modes of Intervention: Insight Timer App   Patient Response to Interventions:  Engaged   Education Outcome:  In group clarification offered    Clinical Observations/Individualized Feedback: Pt attended and participated in group session. Pt was attentive and appropriate throughout group.      Plan: Continue to engage patient in RT group sessions 2-3x/week.   Shreyansh Tiffany-McCall, LRT,CTRS 05/28/2023 12:32 PM

## 2023-05-28 NOTE — Group Note (Signed)
 Date:  05/28/2023 Time:  2:17 PM  Group Topic/Focus:  Goals Group:   The focus of this group is to help patients establish daily goals to achieve during treatment and discuss how the patient can incorporate goal setting into their daily lives to aide in recovery. Orientation:   The focus of this group is to educate the patient on the purpose and policies of crisis stabilization and provide a format to answer questions about their admission.  The group details unit policies and expectations of patients while admitted.    Participation Level:  Active  Participation Quality:  Appropriate  Affect:  Appropriate  Cognitive:  Appropriate  Insight: Appropriate  Engagement in Group:  Engaged  Modes of Intervention:  Orientation, Rapport Building, and Support  Additional Comments:   Pt attended and participated in the Orientation and Goals group. Pt contributed to discussion and disclosed that she was accepted into a treatment facility in [place] . Pt was receptive to the support and encouragement by peers and MHT. Pt personal goal is to successfully enroll in the program and to find her calling and purpose.   Shade Darby Dominik Lauricella 05/28/2023, 2:17 PM

## 2023-05-28 NOTE — Group Note (Signed)
 Date:  05/28/2023 Time:  4:49 PM  Group Topic/Focus:  Healthy Communication:   The focus of this group is to discuss communication, barriers to communication, as well as healthy ways to communicate with others. Managing Feelings:   The focus of this group is to identify what feelings patients have difficulty handling and develop a plan to handle them in a healthier way upon discharge.    Participation Level:  Did Not Attend  Participation Quality:   n/a  Affect:   n/a  Cognitive:   n/a  Insight: None  Engagement in Group:   n/a  Modes of Intervention:   n/a  Additional Comments:   Pt did not attend.  Kathryn [name] 05/28/2023, 4:49 PM

## 2023-05-29 ENCOUNTER — Emergency Department (HOSPITAL_COMMUNITY): Payer: Medicare HMO

## 2023-05-29 ENCOUNTER — Other Ambulatory Visit: Payer: Self-pay

## 2023-05-29 ENCOUNTER — Encounter (HOSPITAL_COMMUNITY): Payer: Self-pay

## 2023-05-29 ENCOUNTER — Emergency Department (HOSPITAL_COMMUNITY)
Admission: EM | Admit: 2023-05-29 | Discharge: 2023-05-29 | Disposition: A | Discharge status: Home / Self Care | Payer: Medicare HMO | Attending: Emergency Medicine | Admitting: Emergency Medicine

## 2023-05-29 DIAGNOSIS — M545 Low back pain, unspecified: Secondary | ICD-10-CM | POA: Insufficient documentation

## 2023-05-29 DIAGNOSIS — I10 Essential (primary) hypertension: Secondary | ICD-10-CM | POA: Insufficient documentation

## 2023-05-29 DIAGNOSIS — G8929 Other chronic pain: Secondary | ICD-10-CM | POA: Diagnosis not present

## 2023-05-29 DIAGNOSIS — J45909 Unspecified asthma, uncomplicated: Secondary | ICD-10-CM | POA: Insufficient documentation

## 2023-05-29 DIAGNOSIS — M549 Dorsalgia, unspecified: Secondary | ICD-10-CM | POA: Diagnosis not present

## 2023-05-29 DIAGNOSIS — F333 Major depressive disorder, recurrent, severe with psychotic symptoms: Secondary | ICD-10-CM | POA: Diagnosis present

## 2023-05-29 DIAGNOSIS — Z981 Arthrodesis status: Secondary | ICD-10-CM | POA: Diagnosis not present

## 2023-05-29 DIAGNOSIS — J449 Chronic obstructive pulmonary disease, unspecified: Secondary | ICD-10-CM | POA: Insufficient documentation

## 2023-05-29 DIAGNOSIS — Z87891 Personal history of nicotine dependence: Secondary | ICD-10-CM | POA: Insufficient documentation

## 2023-05-29 DIAGNOSIS — G4700 Insomnia, unspecified: Secondary | ICD-10-CM | POA: Diagnosis present

## 2023-05-29 DIAGNOSIS — M47813 Spondylosis without myelopathy or radiculopathy, cervicothoracic region: Secondary | ICD-10-CM | POA: Diagnosis not present

## 2023-05-29 DIAGNOSIS — Z4789 Encounter for other orthopedic aftercare: Secondary | ICD-10-CM | POA: Diagnosis not present

## 2023-05-29 DIAGNOSIS — F1910 Other psychoactive substance abuse, uncomplicated: Secondary | ICD-10-CM | POA: Diagnosis present

## 2023-05-29 LAB — RAPID URINE DRUG SCREEN, HOSP PERFORMED
Amphetamines: NOT DETECTED
Barbiturates: NOT DETECTED
Benzodiazepines: NOT DETECTED
Cocaine: NOT DETECTED
Opiates: NOT DETECTED
Tetrahydrocannabinol: NOT DETECTED

## 2023-05-29 LAB — COMPREHENSIVE METABOLIC PANEL
ALT: 20 U/L (ref 0–44)
AST: 23 U/L (ref 15–41)
Albumin: 4.3 g/dL (ref 3.5–5.0)
Alkaline Phosphatase: 80 U/L (ref 38–126)
Anion gap: 10 (ref 5–15)
BUN: 17 mg/dL (ref 6–20)
CO2: 27 mmol/L (ref 22–32)
Calcium: 9.7 mg/dL (ref 8.9–10.3)
Chloride: 101 mmol/L (ref 98–111)
Creatinine, Ser: 0.77 mg/dL (ref 0.44–1.00)
GFR, Estimated: >60 mL/min (ref >60)
Glucose, Bld: 98 mg/dL (ref 70–99)
Potassium: 3.8 mmol/L (ref 3.5–5.1)
Sodium: 138 mmol/L (ref 135–145)
Total Bilirubin: 0.5 mg/dL (ref 0.0–1.2)
Total Protein: 8.3 g/dL — ABNORMAL HIGH (ref 6.5–8.1)

## 2023-05-29 LAB — CBC WITH DIFFERENTIAL/PLATELET
Abs Immature Granulocytes: 0.05 10*3/uL (ref 0.00–0.07)
Basophils Absolute: 0 10*3/uL (ref 0.0–0.1)
Basophils Relative: 0 %
Eosinophils Absolute: 0.2 10*3/uL (ref 0.0–0.5)
Eosinophils Relative: 2 %
HCT: 35.8 % — ABNORMAL LOW (ref 36.0–46.0)
Hemoglobin: 11.5 g/dL — ABNORMAL LOW (ref 12.0–15.0)
Immature Granulocytes: 1 %
Lymphocytes Relative: 34 %
Lymphs Abs: 3.1 10*3/uL (ref 0.7–4.0)
MCH: 29.1 pg (ref 26.0–34.0)
MCHC: 32.1 g/dL (ref 30.0–36.0)
MCV: 90.6 fL (ref 80.0–100.0)
Monocytes Absolute: 0.5 10*3/uL (ref 0.1–1.0)
Monocytes Relative: 6 %
Neutro Abs: 5.2 10*3/uL (ref 1.7–7.7)
Neutrophils Relative %: 57 %
Platelets: 726 10*3/uL — ABNORMAL HIGH (ref 150–400)
RBC: 3.95 MIL/uL (ref 3.87–5.11)
RDW: 15.3 % (ref 11.5–15.5)
WBC: 9 10*3/uL (ref 4.0–10.5)
nRBC: 0 % (ref 0.0–0.2)

## 2023-05-29 LAB — ETHANOL: Alcohol, Ethyl (B): <10 mg/dL (ref <10)

## 2023-05-29 NOTE — ED Notes (Signed)
Patient transported to X-ray

## 2023-05-29 NOTE — Progress Notes (Signed)
Kathryn [name] requested that chaplain pray for her for peace and healing.  Chaplain provided prayer and spiritual support.   [address], Bcc Pager, [phone]

## 2023-05-29 NOTE — Progress Notes (Signed)
I assumed care for Ms Kathryn [name] at about 08:00.  She was resting in the day area socializing with peers.  She denied any avh/hi/si, reports that she is excited about upcoming discharge this am from Hospital to go "Start a new life away from [place]".  No falls, she uses a walker for ambulation, vital signs WNL.She took all her scheduled meds as ordered. Pt left this facility at about 08:35 via [employer], all AVS documentation reviewed with patient and she verbalized understanding. Personal belongings handed over to pt. Dorothea Ogle and bus ticket given to pt with all relevant discharge paperwork including prescription pads.

## 2023-05-29 NOTE — ED Notes (Signed)
Upt to b/r with personal rolling walker, ans NT assist

## 2023-05-29 NOTE — ED Provider Triage Note (Signed)
Emergency Medicine Provider Triage Evaluation Note  Kathryn [name] , a 53 y.o. female  was evaluated in triage.  Pt complains of multiple concerns.  Patient endorses SI after missing her breast to go to [place] earlier today.  Additionally she states that she fell and landed on her back and is having mid and low back pain.  Lastly, she endorses smoking crack the other day and is requesting detox as well.  Review of Systems  Positive: Back pain, SI, hallucinations Negative: HI, chest pain, shortness of breath  Physical Exam  BP (!) 139/92 (BP Location: Left Arm)   Pulse 83   Temp 98.3 F (36.8 C) (Oral)   Resp 16   Ht 4\' 11"  (1.499 m)   Wt 76 kg   LMP 06/07/2020 Comment: CT showed evidence of uterus and cervix  SpO2 100%   BMI 33.84 kg/m  Gen:   Awake, no distress   Resp:  Normal effort  MSK:   Moves extremities without difficulty  Other:    Medical Decision Making  Medically screening exam initiated at 3:18 PM.  Appropriate orders placed.  Kathryn [name] was informed that the remainder of the evaluation will be completed by another provider, this initial triage assessment does not replace that evaluation, and the importance of remaining in the ED until their evaluation is complete.   Maxwell Marion, PA-C 05/29/23 1527

## 2023-05-29 NOTE — ED Notes (Signed)
Alert, NAD, calm, interactive, back to exam area from triage via w/c. EDP by to see.

## 2023-05-29 NOTE — Discharge Instructions (Addendum)
Please call the telephone number in your best to get to see if he can change or take it.  Follow-up with your primary care doctor.  Return to the emergency department as needed for your medical care.

## 2023-05-29 NOTE — ED Triage Notes (Signed)
Pt arrived reporting chronic low back pain. Hx of back surgery. No new injuries. States she also needs detox from cocaine use. States she used last night. Endorses suicidal ideations, no plan. Denies HI

## 2023-05-29 NOTE — Discharge Summary (Signed)
 Physician Discharge Summary Note  Patient:  Kathryn [name] is an 53 y.o., female MRN:  [id number] DOB:  April 04, 1971 Patient phone:  [phone] (home)  Patient address:   [address] [place] [facility] [address],  Total Time spent with patient: 45 minutes  Date of Admission:  05/29/2023 Date of Discharge: 05/29/2023  Reason for Admission:  Patient is a 53 yo AAF with a prior mental health history of cocaine dependence, MDD, and GAD who presented to the [facility] ER on 2/2 for evaluation of global throbbing headaches, intermittent epistaxis, subjective fevers & chills at home. While being worked up medically, pt reported depressive symptoms secondary to financial stressors & homelessness. She reported being Suicidal with plan to walk in front of a car if she could not obtain housing. Pt was subsequently transferred to this [facility] on 2/4 after being medically cleared, for treatment and stabilization of her mental health status.   Principal Problem: <principal problem not specified> Discharge Diagnoses: Active Problems:   Insomnia   Polysubstance abuse (HCC)   MDD (major depressive disorder), recurrent, severe, with psychosis (HCC)  Past Psychiatric History: See H & P  Past Medical History:  Past Medical History:  Diagnosis Date   Anxiety    Arthritis    Asthma    Chronic back pain    COPD (chronic obstructive pulmonary disease) (HCC)    Chronic bronchitis   Depression    Dyspnea    GERD (gastroesophageal reflux disease)    Hypertension    Migraine    Neuropathy    Osteoarthritis of left knee, 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   Single subsegmental pulmonary embolism without acute cor pulmonale (HCC) 06/20/2021   Sleep apnea 04/2020   GETTING A cpap   Suicidal ideation 12/24/2018    Past Surgical History:  Procedure Laterality Date   BACK SURGERY     DILATATION AND CURETTAGE/HYSTEROSCOPY WITH MINERVA N/A 06/09/2020   Procedure: DILATATION AND CURETTAGE/HYSTEROSCOPY WITH MINERVA;   Surgeon: Lazaro Arms, MD;  Location: [facility];  Service: Gynecology;  Laterality: N/A;   ESOPHAGOGASTRODUODENOSCOPY (EGD) WITH PROPOFOL N/A 12/25/2018   Procedure: ESOPHAGOGASTRODUODENOSCOPY (EGD) WITH PROPOFOL;  Surgeon: Malissa Hippo, MD;  Location: [facility];  Service: Endoscopy;  Laterality: N/A;   FLEXIBLE SIGMOIDOSCOPY  10/14/2021   Procedure: FLEXIBLE SIGMOIDOSCOPY;  Surgeon: Dolores Frame, MD;  Location: [facility];  Service: Gastroenterology;;   PATELLA-FEMORAL ARTHROPLASTY Left 10/08/2018   Procedure: PATELLA-FEMORAL ARTHROPLASTY;  Surgeon: Teryl Lucy, MD;  Location: [facility];  Service: Orthopedics;  Laterality: Left;   TUBAL LIGATION     Family History:  Family History  Problem Relation Age of Onset   Gout Paternal Grandfather    Cirrhosis Paternal Grandfather    Hypertension Paternal Grandmother    Aneurysm Paternal Grandmother    Cirrhosis Maternal Grandmother    Cirrhosis Maternal Grandfather    Cancer Father    Cirrhosis Father    Cirrhosis Mother    Breast cancer Sister    Hypertension Sister    Bronchitis Daughter    Bronchitis Daughter    Asthma Son    Bronchitis Son    Migraines Neg Hx    Family Psychiatric  History: See H & P Social History:  Social History   Substance and Sexual Activity  Alcohol Use Not Currently   Comment: states quit  06/29/22     Social History   Substance and Sexual Activity  Drug Use Yes   Types: Cocaine   Comment: last used 05/2022  Social History   Socioeconomic History   Marital status: Single    Spouse name: Not on file   Number of children: 3   Years of education: 10   Highest education level: 10th grade  Occupational History   Not on file  Tobacco Use   Smoking status: Former    Current packs/day: 1.00    Average packs/day: 1 pack/day for 24.2 years (24.2 ttl pk-yrs)    Types: Cigarettes    Start date: 03/26/1999    Passive exposure: Never   Smokeless tobacco: Never   Tobacco  comments:    Smokes often, especially when using crack cocaine  Vaping Use   Vaping status: Never Used  Substance and Sexual Activity   Alcohol use: Not Currently    Comment: states quit  06/29/22   Drug use: Yes    Types: Cocaine    Comment: last used 05/2022   Sexual activity: Yes    Birth control/protection: Surgical    Comment: tubal, ablation  Other Topics Concern   Not on file  Social History Narrative   R handed    Lives with boyfriend   1 Cup of caffeine daily    Social Drivers of Health   Financial Resource Strain: Not on file  Food Insecurity: Food Insecurity Present (05/22/2023)   Hunger Vital Sign    Worried About Running Out of Food in the Last Year: Sometimes true    Ran Out of Food in the Last Year: Sometimes true  Transportation Needs: No Transportation Needs (05/22/2023)   PRAPARE - Administrator, Civil Service (Medical): No    Lack of Transportation (Non-Medical): No  Recent Concern: Transportation Needs - Unmet Transportation Needs (03/26/2023)   PRAPARE - Administrator, Civil Service (Medical): Yes    Lack of Transportation (Non-Medical): Yes  Physical Activity: Not on file  Stress: Not on file  Social Connections: Unknown (08/30/2021)   Received from [facility]   Social Network    Social Network: Not on file    Hospital Course:   During the patient's hospitalization, patient had extensive initial psychiatric evaluation, and follow-up psychiatric evaluations every day. Psychiatric diagnoses provided upon initial assessment: As listed above. Patient's psychiatric medications were adjusted on admission: As Follows: -Discontinue Celexa as patient states that this medication is ineffective -Start Prozac 10 mg today and increase to 20 mg after 2 doses -Start hydroxyzine 25 mg 3 times daily as needed for anxiety -Continue Seroquel 100 mg nightly for sleep and mood stabilization -Start Seroquel 25 mg daily in the  mornings for anxiety -Start Ativan 1 mg Q 6 H PRN for CIWAs >10   During the hospitalization, other adjustments were made to the patient's psychiatric medication regimen. Patient was weaned off all Psychotropic medications with the exception of Hydroxyzine for anxiety. This is the requirement of the receiving facility in [place] where patient is going on 2/11 for substance abuse treatment.   Medications at discharge are as follows: -Continue hydroxyzine 25 mg 3 times daily as needed for anxiety    Meds for medical reasons: -Continue Eliquis 5 mg twice daily for thrombosis prophylaxis -Continue Norvasc 5 mg daily for hypertension -Continue Lyrica 75 mg twice daily for neuropathy -Continue albuterol every 6 hours as needed for wheezing/SOB.  -Continue Protonix 40 mg po Q am for GERD.   Patient's care was discussed during the interdisciplinary team meeting every day during the hospitalization.The patient denies having side effects to prescribed  psychiatric medication. Gradually, patient started adjusting to milieu. The patient was evaluated each day by a clinical provider to ascertain response to treatment. Improvement was noted by the patient's report of decreasing symptoms, improved sleep and appetite, affect, medication tolerance, behavior, and participation in unit programming.  Patient was asked each day to complete a self inventory noting mood, mental status, pain, new symptoms, anxiety and concerns.     Symptoms were reported as significantly decreased or resolved completely by discharge. On day of discharge, the patient reports that their mood is stable. The patient denied having suicidal thoughts for more than 48 hours prior to discharge.  Patient denies having homicidal thoughts.  Patient denies having auditory hallucinations.  Patient denies any visual hallucinations or other symptoms of psychosis. The patient was motivated to continue taking medication with a goal of continued improvement  in mental health.    The patient reports their target psychiatric symptoms of depression, psychosis, anxiety and insomnia responded well to the psychiatric medications, and the patient reports overall benefit other psychiatric hospitalization. Supportive psychotherapy was provided to the patient. The patient also participated in regular group therapy while hospitalized. Coping skills, problem solving as well as relaxation therapies were also part of the unit programming.   Labs were reviewed with the patient, and abnormal results were discussed with the patient.   The patient is able to verbalize their individual safety plan to this provider.   # It is recommended to the patient to continue psychiatric medications as prescribed, after discharge from the hospital.     # It is recommended to the patient to follow up with your outpatient psychiatric provider and PCP.   # It was discussed with the patient, the impact of alcohol, drugs, tobacco have been there overall psychiatric and medical wellbeing, and total abstinence from substance use was recommended the patient.ed.   # Prescriptions provided or sent directly to preferred pharmacy at discharge. Patient agreeable to plan. Given opportunity to ask questions. Appears to feel comfortable with discharge.    # In the event of worsening symptoms, the patient is instructed to call the crisis hotline, 911 and or go to the nearest ED for appropriate evaluation and treatment of symptoms. To follow-up with primary care provider for other medical issues, concerns and or health care needs   # Patient was discharged to [facility], in [place] with a plan to follow up as noted below. Patient educated to ask the facility to guidance with following up with her medical problems once there, and verbalizes understanding.   Total Time spent with patient: 45 minutes  Physical Findings: AIMS: 0 CIWA:  n/a COWS:    n/a  Musculoskeletal: Strength & Muscle Tone: within normal limits Gait & Station: normal Patient leans: N/A  Psychiatric Specialty Exam:  Presentation  General Appearance:  Appropriate for Environment; Casual; Fairly Groomed  Eye Contact: Good  Speech: Normal Rate; Clear and Coherent  Speech Volume: Normal  Handedness: Right   Mood and Affect  Mood: Euthymic  Affect: Appropriate; Congruent; Full Range   Thought Process  Thought Processes: Linear  Descriptions of Associations:Intact  Orientation:Full (Time, Place and Person)  Thought Content:Logical  History of Schizophrenia/Schizoaffective disorder:No  Duration of Psychotic Symptoms:Less than six months  Hallucinations:Hallucinations: None  Ideas of Reference:None  Suicidal Thoughts:Suicidal Thoughts: No  Homicidal Thoughts:Homicidal Thoughts: No   Sensorium  Memory: Remote Good; Immediate Fair; Recent Fair  Judgment: Good  Insight: Good   Executive Functions  Concentration:  Good  Attention Span: Good  Recall: Good  Fund of Knowledge: Good  Language: Good   Psychomotor Activity  Psychomotor Activity: Psychomotor Activity: Normal   Assets  Assets: Resilience   Sleep  Sleep: Sleep: Fair    Physical Exam: Physical Exam ROS Blood pressure (!) 139/92, pulse 83, temperature 98.3 F (36.8 C), temperature source Oral, resp. rate 16, height 4\' 11"  (1.499 m), weight 76 kg, last menstrual period 06/07/2020, SpO2 100%. Body mass index is 33.84 kg/m.   Social History   Tobacco Use  Smoking Status Former   Current packs/day: 1.00   Average packs/day: 1 pack/day for 24.2 years (24.2 ttl pk-yrs)   Types: Cigarettes   Start date: 03/26/1999   Passive exposure: Never  Smokeless Tobacco Never  Tobacco Comments   Smokes often, especially when using crack cocaine   Tobacco Cessation:  A prescription for an FDA-approved tobacco cessation medication was offered at  discharge and the patient refused   Blood Alcohol level:  Lab Results  Component Value Date   ETH <10 05/06/2023   ETH <10 03/01/2023    Metabolic Disorder Labs:  Lab Results  Component Value Date   HGBA1C 5.7 (H) 05/07/2023   MPG 116.89 05/07/2023   MPG 119.76 03/08/2023   No results found for: "PROLACTIN" Lab Results  Component Value Date   CHOL 157 05/07/2023   TRIG 104 05/07/2023   HDL 45 05/07/2023   CHOLHDL 3.5 05/07/2023   VLDL 21 05/07/2023   LDLCALC 91 05/07/2023   LDLCALC 73 03/08/2023    See Psychiatric Specialty Exam and Suicide Risk Assessment completed by Attending Physician prior to discharge.  Discharge destination:  Other:  substance abuse treatment facility  Is patient on multiple antipsychotic therapies at discharge:  No   Has Patient had three or more failed trials of antipsychotic monotherapy by history:  No  Recommended Plan for Multiple Antipsychotic Therapies: NA   Allergies as of 05/29/2023       Reactions   Fish Allergy Anaphylaxis, Shortness Of Breath, Swelling   Flexeril [cyclobenzaprine Hcl] Shortness Of Breath   Ibuprofen Anaphylaxis, Hives, Other (See Comments), Cough, Rash   Motrin [ibuprofen] Anaphylaxis   Shellfish Allergy Anaphylaxis   Tylenol [acetaminophen] Anaphylaxis   Tramadol Nausea And Vomiting   Upset stomach   Ace Inhibitors Cough   Flexeril [cyclobenzaprine] Hives   Peanut Allergen Powder-dnfp    Trazodone And Nefazodone Hives        Medication List     STOP taking these medications    albuterol 108 (90 Base) MCG/ACT inhaler Commonly known as: VENTOLIN HFA   amLODipine 5 MG tablet Commonly known as: NORVASC   apixaban 5 MG Tabs tablet Commonly known as: ELIQUIS   hydrOXYzine 25 MG tablet Commonly known as: ATARAX   lidocaine 5 % Commonly known as: LIDODERM   pantoprazole 40 MG tablet Commonly known as: PROTONIX   pregabalin 75 MG capsule Commonly known as: LYRICA   thiamine 100 MG  tablet Commonly known as: Vitamin B-1       Signed: Starleen Blue, NP 05/29/2023, 4:51 PM

## 2023-05-29 NOTE — ED Provider Notes (Signed)
[place] EMERGENCY DEPARTMENT AT [facility] Provider Note  CSN: [id number] Arrival date & time: 05/29/23 1413  Chief Complaint(s) Back Pain  HPI Kathryn [name] is a 53 y.o. female here today for back pain and wanting detox from cocaine use.  Patient states that her back pain has been ongoing for years.  She has not had any falls, trauma to the area.  No fever, no chills.  Patient discharged from [facility] yesterday into a taxi with a bus ticket to a detox facility in [place].  Past Medical History Past Medical History:  Diagnosis Date   Anxiety    Arthritis    Asthma    Chronic back pain    COPD (chronic obstructive pulmonary disease) (HCC)    Chronic bronchitis   Depression    Dyspnea    GERD (gastroesophageal reflux disease)    Hypertension    Migraine    Neuropathy    Osteoarthritis of left knee, 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   Single subsegmental pulmonary embolism without acute cor pulmonale (HCC) 06/20/2021   Sleep apnea 04/2020   GETTING A cpap   Suicidal ideation 12/24/2018   Patient Active Problem List   Diagnosis Date Noted   COPD (chronic obstructive pulmonary disease) (HCC) 05/23/2023   Cocaine use disorder, severe, dependence (HCC) 05/23/2023   Alcohol use disorder, mild, abuse 05/23/2023   MDD (major depressive disorder), recurrent, severe, with psychosis (HCC) 05/23/2023   Acute embolic stroke (HCC) 05/07/2023   Aortic thrombus (HCC) 05/07/2023   Acute encephalopathy 05/07/2023   Cocaine abuse (HCC) 05/07/2023   Influenza A 05/07/2023   Homelessness 07/02/2022   Polysubstance abuse (HCC) 10/20/2021   Cocaine use disorder (HCC) 10/18/2021   Major depressive disorder, 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/2021   Idiopathic peripheral neuropathy 09/06/2021   Essential thrombocytosis (HCC) 08/05/2021   Rectal bleeding 07/04/2021   GAD (generalized anxiety disorder) 03/24/2021   Thyroid nodule 02/21/2021   History of thrombosis  09/14/2020   Spondylolisthesis at L4-L5 level 03/29/2020   Insomnia 03/19/2020   Moderate persistent asthma without complication 03/19/2020   Intermittent palpitations 12/17/2019   Excessive daytime sleepiness 12/17/2019   OA (osteoarthritis) of knee 12/27/2017   Hypertension 01/11/2017   Home Medication(s) Prior to Admission medications   Not on File                                                                                                                                    Past Surgical History Past Surgical History:  Procedure Laterality Date   BACK SURGERY     DILATATION AND CURETTAGE/HYSTEROSCOPY WITH MINERVA N/A 06/09/2020   Procedure: DILATATION AND CURETTAGE/HYSTEROSCOPY WITH MINERVA;  Surgeon: Lazaro Arms, MD;  Location: [facility];  Service: Gynecology;  Laterality: N/A;   ESOPHAGOGASTRODUODENOSCOPY (EGD) WITH PROPOFOL N/A 12/25/2018   Procedure: ESOPHAGOGASTRODUODENOSCOPY (EGD) WITH PROPOFOL;  Surgeon: Malissa Hippo, MD;  Location: [facility];  Service:  Endoscopy;  Laterality: N/A;   FLEXIBLE SIGMOIDOSCOPY  10/14/2021   Procedure: FLEXIBLE SIGMOIDOSCOPY;  Surgeon: Dolores Frame, MD;  Location: [facility];  Service: Gastroenterology;;   PATELLA-FEMORAL ARTHROPLASTY Left 10/08/2018   Procedure: PATELLA-FEMORAL ARTHROPLASTY;  Surgeon: Teryl Lucy, MD;  Location: [facility];  Service: Orthopedics;  Laterality: Left;   TUBAL LIGATION     Family History Family History  Problem Relation Age of Onset   Gout Paternal Grandfather    Cirrhosis Paternal Grandfather    Hypertension Paternal Grandmother    Aneurysm Paternal Grandmother    Cirrhosis Maternal Grandmother    Cirrhosis Maternal Grandfather    Cancer Father    Cirrhosis Father    Cirrhosis Mother    Breast cancer Sister    Hypertension Sister    Bronchitis Daughter    Bronchitis Daughter    Asthma Son    Bronchitis Son    Migraines Neg Hx     Social History Social History   Tobacco  Use   Smoking status: Former    Current packs/day: 1.00    Average packs/day: 1 pack/day for 24.2 years (24.2 ttl pk-yrs)    Types: Cigarettes    Start date: 03/26/1999    Passive exposure: Never   Smokeless tobacco: Never   Tobacco comments:    Smokes often, especially when using crack cocaine  Vaping Use   Vaping status: Never Used  Substance Use Topics   Alcohol use: Not Currently    Comment: states quit  06/29/22   Drug use: Yes    Types: Cocaine    Comment: last used 05/2022   Allergies Fish allergy, Flexeril [cyclobenzaprine hcl], Ibuprofen, Motrin [ibuprofen], Shellfish allergy, Tylenol [acetaminophen], Tramadol, Ace inhibitors, Flexeril [cyclobenzaprine], Peanut allergen powder-dnfp, and Trazodone and nefazodone  Review of Systems Review of Systems  Physical Exam Vital Signs  I have reviewed the triage vital signs BP (!) 139/92 (BP Location: Left Arm)   Pulse 83   Temp 98.3 F (36.8 C) (Oral)   Resp 16   Ht 4\' 11"  (1.499 m)   Wt 76 kg   LMP 06/07/2020 Comment: CT showed evidence of uterus and cervix  SpO2 100%   BMI 33.84 kg/m   Physical Exam Vitals reviewed.  Cardiovascular:     Rate and Rhythm: Normal rate.  Pulmonary:     Effort: Pulmonary effort is normal.  Abdominal:     General: Abdomen is flat.     Palpations: Abdomen is soft.  Musculoskeletal:        General: Normal range of motion.  Skin:    General: Skin is warm.  Neurological:     General: No focal deficit present.     Mental Status: She is alert.     Cranial Nerves: No cranial nerve deficit.     Motor: No weakness.     Gait: Gait normal.  Psychiatric:        Mood and Affect: Mood normal.     ED Results and Treatments Labs (all labs ordered are listed, but only abnormal results are displayed) Labs Reviewed  COMPREHENSIVE METABOLIC PANEL - Abnormal; Notable for the following components:      Result Value   Total Protein 8.3 (*)    All other components within normal limits  CBC  WITH DIFFERENTIAL/PLATELET - Abnormal; Notable for the following components:   Hemoglobin 11.5 (*)    HCT 35.8 (*)    Platelets 726 (*)    All other components within normal limits  ETHANOL  RAPID URINE DRUG SCREEN, HOSP PERFORMED                                                                                                                          Radiology DG Lumbar Spine Complete Result Date: 05/29/2023 CLINICAL DATA:  Back pain after falling. EXAM: THORACIC SPINE 2 VIEWS; LUMBAR SPINE - COMPLETE 4+ VIEW COMPARISON:  Chest radiographs 05/21/2023, CTA of the chest, abdomen and pelvis 05/20/2023 and lumbar spine CT 07/26/2022. FINDINGS: Thoracic spine: There are 12 rib-bearing thoracic type vertebral bodies. The alignment is normal. No evidence of acute fracture, paraspinal hematoma or widening of the interpedicular distance. There is multilevel cervicothoracic spondylosis with endplate osteophytes, similar to previous CT. Lumbar spine: There are 5 lumbar type vertebral bodies. Stable postsurgical changes from posterior lumbar and interbody fusion from L3 through L5. The hardware appears intact without loosening. The alignment is normal. No evidence of acute fracture or pars defect. The sacroiliac joints are intact. The hip joint spaces appear preserved. IMPRESSION: 1. No evidence of acute fracture or malalignment in the thoracic or lumbar spine. 2. Stable postsurgical changes from L3 through L5 fusion. 3. Multilevel cervicothoracic spondylosis. Electronically Signed   By: Carey Bullocks M.D.   On: 05/29/2023 17:19   DG Thoracic Spine 2 View Result Date: 05/29/2023 CLINICAL DATA:  Back pain after falling. EXAM: THORACIC SPINE 2 VIEWS; LUMBAR SPINE - COMPLETE 4+ VIEW COMPARISON:  Chest radiographs 05/21/2023, CTA of the chest, abdomen and pelvis 05/20/2023 and lumbar spine CT 07/26/2022. FINDINGS: Thoracic spine: There are 12 rib-bearing thoracic type vertebral bodies. The alignment is normal. No  evidence of acute fracture, paraspinal hematoma or widening of the interpedicular distance. There is multilevel cervicothoracic spondylosis with endplate osteophytes, similar to previous CT. Lumbar spine: There are 5 lumbar type vertebral bodies. Stable postsurgical changes from posterior lumbar and interbody fusion from L3 through L5. The hardware appears intact without loosening. The alignment is normal. No evidence of acute fracture or pars defect. The sacroiliac joints are intact. The hip joint spaces appear preserved. IMPRESSION: 1. No evidence of acute fracture or malalignment in the thoracic or lumbar spine. 2. Stable postsurgical changes from L3 through L5 fusion. 3. Multilevel cervicothoracic spondylosis. Electronically Signed   By: Carey Bullocks M.D.   On: 05/29/2023 17:19    Pertinent labs & imaging results that were available during my care of the patient were reviewed by me and considered in my medical decision making (see MDM for details).  Medications Ordered in ED Medications - No data to display  Procedures Procedures  (including critical care time)  Medical Decision Making / ED Course   This patient presents to the ED for concern of back pain and wanting detox, this involves an extensive number of treatment options, and is a complaint that carries with it a high risk of complications and morbidity.  The differential diagnosis includes substance abuse, housing security, chronic back pain.  MDM: Regarding the patient's back pain, plain films ordered.  No neurological deficits.  No negation for advanced imaging.  Lower suspicion for infectious process.  Regarding the patient's desire for detox, leading to her previous notes, appears she was put into a taxi and taken to the bus station with a ticket.  She states that [employer] buses do not know to this  particular station.  Have reached out to [facility] to see if we can still get patient into this facility.  Reassessment 5:50 PM-spoke with social work who we will DC the patient's bus take it, it was supposed to leave at 930 this morning from the bus station in [place].  They encouraged patient to call to see if she can get her to get changed.  Patient not endorsing any suicidality.  Patient had reported vague suicidal ideation to triage staff, however on my assessment of the patient, this does not appear to be clear suicidality, and my suspicion is that his suicidal ideation statements are secondary gain to get access to a facility.  Will discharge patient.    Additional history obtained: -Additional history obtained from spine x-rays -External records from outside source obtained and reviewed including: Chart review including previous notes, labs, imaging, consultation notes   Lab Tests: -I ordered, reviewed, and interpreted labs.   The pertinent results include:   Labs Reviewed  COMPREHENSIVE METABOLIC PANEL - Abnormal; Notable for the following components:      Result Value   Total Protein 8.3 (*)    All other components within normal limits  CBC WITH DIFFERENTIAL/PLATELET - Abnormal; Notable for the following components:   Hemoglobin 11.5 (*)    HCT 35.8 (*)    Platelets 726 (*)    All other components within normal limits  ETHANOL  RAPID URINE DRUG SCREEN, HOSP PERFORMED      EKG   EKG Interpretation Date/Time:    Ventricular Rate:    PR Interval:    QRS Duration:    QT Interval:    QTC Calculation:   R Axis:      Text Interpretation:           Imaging Studies ordered: I ordered imaging studies including spine x-rays I independently visualized and interpreted imaging. I agree with the radiologist interpretation   Medicines ordered and prescription drug management: No orders of the defined types were placed in this encounter.   -I have reviewed the  patients home medicines and have made adjustments as needed   Cardiac Monitoring: The patient was maintained on a cardiac monitor.  I personally viewed and interpreted the cardiac monitored which showed an underlying rhythm of:   Social Determinants of Health:  Factors impacting patients care include: Polysubstance abuse   Reevaluation: After the interventions noted above, I reevaluated the patient and found that they have :improved  Co morbidities that complicate the patient evaluation  Past Medical History:  Diagnosis Date   Anxiety    Arthritis    Asthma    Chronic back pain    COPD (chronic obstructive pulmonary disease) (HCC)    Chronic bronchitis  Depression    Dyspnea    GERD (gastroesophageal reflux disease)    Hypertension    Migraine    Neuropathy    Osteoarthritis of left knee, 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   Single subsegmental pulmonary embolism without acute cor pulmonale (HCC) 06/20/2021   Sleep apnea 04/2020   GETTING A cpap   Suicidal ideation 12/24/2018      Dispostion: I considered admission for this patient, however patient is appropriate for outpatient follow-up.     Final Clinical Impression(s) / ED Diagnoses Final diagnoses:  Chronic midline low back pain without sciatica     @PCDICTATION @    Anders Simmonds T, DO 05/29/23 1754

## 2023-06-01 ENCOUNTER — Inpatient Hospital Stay: Payer: Medicare HMO | Admitting: Internal Medicine

## 2023-06-04 NOTE — Discharge Summary (Signed)
Physician Discharge Summary Note  Patient:  Kathryn [name] is an 53 y.o., female MRN:  [id number] DOB:  27-Sep-1970 Patient phone:  [phone] (home)  Patient address:   [address] [place] [facility] [address],  Total Time spent with patient: 45 minutes  Date of Admission:  05/22/2023 Date of Discharge: 05/29/2023  Reason for Admission:  Patient is a 53 yo AAF with a prior mental health history of cocaine dependence, MDD, and GAD who presented to the [facility] ER on 2/2 for evaluation of global throbbing headaches, intermittent epistaxis, subjective fevers & chills at home. While being worked up medically, pt reported depressive symptoms secondary to financial stressors & homelessness. She reported being Suicidal with plan to walk in front of a car if she could not obtain housing. Pt was subsequently transferred to this [facility] on 2/4 after being medically cleared, for treatment and stabilization of her mental health status.   Principal Problem: MDD (major depressive disorder), recurrent, severe, with psychosis (HCC) Discharge Diagnoses: Principal Problem:   MDD (major depressive disorder), recurrent, severe, with psychosis (HCC) Active Problems:   Hypertension   History of thrombosis   COPD (chronic obstructive pulmonary disease) (HCC)   Cocaine use disorder, severe, dependence (HCC)   Alcohol use disorder, mild, abuse  Past Psychiatric History: See H & P  Past Medical History:  Past Medical History:  Diagnosis Date   Anxiety    Arthritis    Asthma    Chronic back pain    COPD (chronic obstructive pulmonary disease) (HCC)    Chronic bronchitis   Depression    Dyspnea    GERD (gastroesophageal reflux disease)    Hypertension    Migraine    Neuropathy    Osteoarthritis of left knee, 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   Single subsegmental pulmonary embolism without acute cor pulmonale (HCC) 06/20/2021   Sleep apnea 04/2020   GETTING A cpap   Suicidal ideation 12/24/2018    Past  Surgical History:  Procedure Laterality Date   BACK SURGERY     DILATATION AND CURETTAGE/HYSTEROSCOPY WITH MINERVA N/A 06/09/2020   Procedure: DILATATION AND CURETTAGE/HYSTEROSCOPY WITH MINERVA;  Surgeon: Lazaro Arms, MD;  Location: [facility];  Service: Gynecology;  Laterality: N/A;   ESOPHAGOGASTRODUODENOSCOPY (EGD) WITH PROPOFOL N/A 12/25/2018   Procedure: ESOPHAGOGASTRODUODENOSCOPY (EGD) WITH PROPOFOL;  Surgeon: Malissa Hippo, MD;  Location: [facility];  Service: Endoscopy;  Laterality: N/A;   FLEXIBLE SIGMOIDOSCOPY  10/14/2021   Procedure: FLEXIBLE SIGMOIDOSCOPY;  Surgeon: Dolores Frame, MD;  Location: [facility];  Service: Gastroenterology;;   PATELLA-FEMORAL ARTHROPLASTY Left 10/08/2018   Procedure: PATELLA-FEMORAL ARTHROPLASTY;  Surgeon: Teryl Lucy, MD;  Location: [facility];  Service: Orthopedics;  Laterality: Left;   TUBAL LIGATION     Family History:  Family History  Problem Relation Age of Onset   Gout Paternal Grandfather    Cirrhosis Paternal Grandfather    Hypertension Paternal Grandmother    Aneurysm Paternal Grandmother    Cirrhosis Maternal Grandmother    Cirrhosis Maternal Grandfather    Cancer Father    Cirrhosis Father    Cirrhosis Mother    Breast cancer Sister    Hypertension Sister    Bronchitis Daughter    Bronchitis Daughter    Asthma Son    Bronchitis Son    Migraines Neg Hx    Family Psychiatric  History: See H & P Social History:  Social History   Substance and Sexual Activity  Alcohol Use Not Currently   Comment:  states quit  06/29/22     Social History   Substance and Sexual Activity  Drug Use Yes   Types: Cocaine   Comment: last used 05/2022    Social History   Socioeconomic History   Marital status: Single    Spouse name: Not on file   Number of children: 3   Years of education: 10   Highest education level: 10th grade  Occupational History   Not on file  Tobacco Use   Smoking status: Former    Current  packs/day: 1.00    Average packs/day: 1 pack/day for 24.2 years (24.2 ttl pk-yrs)    Types: Cigarettes    Start date: 03/26/1999    Passive exposure: Never   Smokeless tobacco: Never   Tobacco comments:    Smokes often, especially when using crack cocaine  Vaping Use   Vaping status: Never Used  Substance and Sexual Activity   Alcohol use: Not Currently    Comment: states quit  06/29/22   Drug use: Yes    Types: Cocaine    Comment: last used 05/2022   Sexual activity: Yes    Birth control/protection: Surgical    Comment: tubal, ablation  Other Topics Concern   Not on file  Social History Narrative   R handed    Lives with boyfriend   1 Cup of caffeine daily    Social Drivers of Health   Financial Resource Strain: Not on file  Food Insecurity: Food Insecurity Present (05/22/2023)   Hunger Vital Sign    Worried About Running Out of Food in the Last Year: Sometimes true    Ran Out of Food in the Last Year: Sometimes true  Transportation Needs: No Transportation Needs (05/22/2023)   PRAPARE - Administrator, Civil Service (Medical): No    Lack of Transportation (Non-Medical): No  Recent Concern: Transportation Needs - Unmet Transportation Needs (03/26/2023)   PRAPARE - Administrator, Civil Service (Medical): Yes    Lack of Transportation (Non-Medical): Yes  Physical Activity: Not on file  Stress: Not on file  Social Connections: Unknown (08/30/2021)   Received from [facility]   Social Network    Social Network: Not on file    Hospital Course:   During the patient's hospitalization, patient had extensive initial psychiatric evaluation, and follow-up psychiatric evaluations every day. Psychiatric diagnoses provided upon initial assessment: As listed above. Patient's psychiatric medications were adjusted on admission: As Follows: -Discontinue Celexa as patient states that this medication is ineffective -Start Prozac 10 mg today and  increase to 20 mg after 2 doses -Start hydroxyzine 25 mg 3 times daily as needed for anxiety -Continue Seroquel 100 mg nightly for sleep and mood stabilization -Start Seroquel 25 mg daily in the mornings for anxiety -Start Ativan 1 mg Q 6 H PRN for CIWAs >10   During the hospitalization, other adjustments were made to the patient's psychiatric medication regimen. Patient was weaned off all Psychotropic medications with the exception of Hydroxyzine for anxiety. This is the requirement of the receiving facility in [place] where patient is going on 2/11 for substance abuse treatment.   Medications at discharge are as follows: -Continue hydroxyzine 25 mg 3 times daily as needed for anxiety    Meds for medical reasons: -Continue Eliquis 5 mg twice daily for thrombosis prophylaxis -Continue Norvasc 5 mg daily for hypertension -Continue Lyrica 75 mg twice daily for neuropathy -Continue albuterol every 6 hours as needed for wheezing/SOB.  -  Continue Protonix 40 mg po Q am for GERD.   Patient's care was discussed during the interdisciplinary team meeting every day during the hospitalization.The patient denies having side effects to prescribed psychiatric medication. Gradually, patient started adjusting to milieu. The patient was evaluated each day by a clinical provider to ascertain response to treatment. Improvement was noted by the patient's report of decreasing symptoms, improved sleep and appetite, affect, medication tolerance, behavior, and participation in unit programming.  Patient was asked each day to complete a self inventory noting mood, mental status, pain, new symptoms, anxiety and concerns.     Symptoms were reported as significantly decreased or resolved completely by discharge. On day of discharge, the patient reports that their mood is stable. The patient denied having suicidal thoughts for more than 48 hours prior to discharge.  Patient denies having homicidal thoughts.  Patient denies  having auditory hallucinations.  Patient denies any visual hallucinations or other symptoms of psychosis. The patient was motivated to continue taking medication with a goal of continued improvement in mental health.    The patient reports their target psychiatric symptoms of depression, psychosis, anxiety and insomnia responded well to the psychiatric medications, and the patient reports overall benefit other psychiatric hospitalization. Supportive psychotherapy was provided to the patient. The patient also participated in regular group therapy while hospitalized. Coping skills, problem solving as well as relaxation therapies were also part of the unit programming.   Labs were reviewed with the patient, and abnormal results were discussed with the patient.   The patient is able to verbalize their individual safety plan to this provider.   # It is recommended to the patient to continue psychiatric medications as prescribed, after discharge from the hospital.     # It is recommended to the patient to follow up with your outpatient psychiatric provider and PCP.   # It was discussed with the patient, the impact of alcohol, drugs, tobacco have been there overall psychiatric and medical wellbeing, and total abstinence from substance use was recommended the patient.ed.   # Prescriptions provided or sent directly to preferred pharmacy at discharge. Patient agreeable to plan. Given opportunity to ask questions. Appears to feel comfortable with discharge.    # In the event of worsening symptoms, the patient is instructed to call the crisis hotline, 911 and or go to the nearest ED for appropriate evaluation and treatment of symptoms. To follow-up with primary care provider for other medical issues, concerns and or health care needs   # Patient was discharged to [facility] in [place] with a plan to follow up as noted below. Patient educated to ask the facility to guidance with  following up with her medical problems once there, and verbalizes understanding.   Total Time spent with patient: 45 minutes  Physical Findings: AIMS: 0 CIWA:  n/a COWS:   n/a  Musculoskeletal: Strength & Muscle Tone: within normal limits Gait & Station: normal Patient leans: N/A  Psychiatric Specialty Exam:  Presentation  General Appearance:  Fairly Groomed  Eye Contact: Fair  Speech: Clear and Coherent  Speech Volume: Normal  Handedness: Right   Mood and Affect  Mood: Euthymic  Affect: Appropriate   Thought Process  Thought Processes: Coherent  Descriptions of Associations:Intact  Orientation:Full (Time, Place and Person)  Thought Content:Logical  History of Schizophrenia/Schizoaffective disorder:No  Duration of Psychotic Symptoms:N/A  Hallucinations:Hallucinations: None   Ideas of Reference:None  Suicidal Thoughts:Suicidal Thoughts: No   Homicidal Thoughts:Homicidal Thoughts: No  Sensorium  Memory: Immediate Fair  Judgment: Fair  Insight: Fair   Chartered certified accountant: Fair  Attention Span: Fair  Recall: [employer] of Knowledge: Fair  Language: Fair   Psychomotor Activity  Psychomotor Activity: Psychomotor Activity: Decreased    Assets  Assets: Resilience   Sleep  Sleep: Sleep: Fair     Physical Exam: Physical Exam Vitals and nursing note reviewed.  Neurological:     General: No focal deficit present.     Mental Status: She is alert and oriented to person, place, and time.  Psychiatric:        Mood and Affect: Mood normal.        Behavior: Behavior normal.        Thought Content: Thought content normal.        Judgment: Judgment normal.   Note: All assessments as noted above were completed on the day of discharge (05/29/2023). Review of Systems  Psychiatric/Behavioral:  Positive for depression (Denies SI/HI, denies plan or intent to harm self or others) and substance abuse  (Educated on cessation). Negative for hallucinations, memory loss and suicidal ideas. The patient is nervous/anxious (Stable at time of discharge) and has insomnia (Stable at the time of discharge.).   All other systems reviewed and are negative.  Blood pressure 130/82, pulse 85, temperature (!) 97.5 F (36.4 C), temperature source Oral, resp. rate 18, height 4\' 11"  (1.499 m), weight 78.8 kg, last menstrual period 06/07/2020, SpO2 99%. Body mass index is 35.1 kg/m.   Social History   Tobacco Use  Smoking Status Former   Current packs/day: 1.00   Average packs/day: 1 pack/day for 24.2 years (24.2 ttl pk-yrs)   Types: Cigarettes   Start date: 03/26/1999   Passive exposure: Never  Smokeless Tobacco Never  Tobacco Comments   Smokes often, especially when using crack cocaine   Tobacco Cessation:  A prescription for an FDA-approved tobacco cessation medication was offered at discharge and the patient refused   Blood Alcohol level:  Lab Results  Component Value Date   ETH <10 05/29/2023   ETH <10 05/06/2023    Metabolic Disorder Labs:  Lab Results  Component Value Date   HGBA1C 5.7 (H) 05/07/2023   MPG 116.89 05/07/2023   MPG 119.76 03/08/2023   No results found for: "PROLACTIN" Lab Results  Component Value Date   CHOL 157 05/07/2023   TRIG 104 05/07/2023   HDL 45 05/07/2023   CHOLHDL 3.5 05/07/2023   VLDL 21 05/07/2023   LDLCALC 91 05/07/2023   LDLCALC 73 03/08/2023    See Psychiatric Specialty Exam and Suicide Risk Assessment completed by Attending Physician prior to discharge.  Discharge destination:  Other:  substance abuse treatment facility  Is patient on multiple antipsychotic therapies at discharge:  No   Has Patient had three or more failed trials of antipsychotic monotherapy by history:  No  Recommended Plan for Multiple Antipsychotic Therapies: NA  Discharge Instructions     Diet - low sodium heart healthy   Complete by: As directed    Increase  activity slowly   Complete by: As directed       Allergies as of 05/29/2023       Reactions   Fish Allergy Anaphylaxis, Shortness Of Breath, Swelling   Flexeril [cyclobenzaprine Hcl] Shortness Of Breath   Ibuprofen Anaphylaxis, Hives, Other (See Comments), Cough, Rash   Motrin [ibuprofen] Anaphylaxis   Shellfish Allergy Anaphylaxis   Tylenol [acetaminophen] Anaphylaxis   Tramadol Nausea And Vomiting  Upset stomach   Ace Inhibitors Cough   Flexeril [cyclobenzaprine] Hives   Peanut Allergen Powder-dnfp    Trazodone And Nefazodone Hives        Medication List     STOP taking these medications    albuterol 108 (90 Base) MCG/ACT inhaler Commonly known as: VENTOLIN HFA   amLODipine 5 MG tablet Commonly known as: NORVASC   atorvastatin 40 MG tablet Commonly known as: LIPITOR   Breo Ellipta 200-25 MCG/ACT Aepb Generic drug: fluticasone furoate-vilanterol   CertaVite/Antioxidants Tabs   citalopram 20 MG tablet Commonly known as: CELEXA   cyanocobalamin 1000 MCG tablet Commonly known as: VITAMIN B12   docusate sodium 100 MG capsule Commonly known as: COLACE   Eliquis 5 MG Tabs tablet Generic drug: apixaban   fluticasone furoate-vilanterol 200-25 MCG/ACT Aepb Commonly known as: Breo Ellipta   folic acid 1 MG tablet Commonly known as: FOLVITE   hydrochlorothiazide 25 MG tablet Commonly known as: HYDRODIURIL   losartan 25 MG tablet Commonly known as: COZAAR   polyethylene glycol powder 17 GM/SCOOP powder Commonly known as: GLYCOLAX/MIRALAX   pregabalin 200 MG capsule Commonly known as: LYRICA   QUEtiapine 300 MG tablet Commonly known as: SEROQUEL        Follow-up Information     [facility] Follow up on 06/20/2023.   Why: You have an appointment for medication management services on  06/20/23 at 10:00 am.  The appointment will be Virtual, but you may switch to in person. Contact information: [address] [place]  [address] [phone]         [facility], [facility] Follow up.   Why: You may also go to this provider for group therapy and/or medication management services if returning to [facility] after [facility] [employer] program. Contact information: [address] [place] [address] [phone]         [facility]. Call.   Why: Please call this provider to personally schedule an appointment for therapy services if you return to [facility] after [facility] program. Contact information: [address] [place] [place], [place] [address] [phone]         [facility]. Go on 05/29/2023.   Why: You have been accepted into [facility] treatment program in [place], [place]. You will take a [employer] to the [employer] bus depot and leave [place] at 9:10AM via [employer] (ticket provided to you at time of discharge). You will arrive in [place], [place] at 10:20PM. Marcelino Duster [phone]) will pick you up when you arrive and you will begin substance use treatment. You will receive therapy and medications while in treatment. Contact information: [facility] [address] [place], [place] [address]  Phone: [phone] or [phone]              Signed: Starleen Blue, NP 06/04/2023, 12:43 PM

## 2023-06-13 DIAGNOSIS — Z6835 Body mass index (BMI) 35.0-35.9, adult: Secondary | ICD-10-CM | POA: Diagnosis not present

## 2023-06-13 DIAGNOSIS — Z91013 Allergy to seafood: Secondary | ICD-10-CM | POA: Diagnosis not present

## 2023-06-13 DIAGNOSIS — Z1152 Encounter for screening for COVID-19: Secondary | ICD-10-CM | POA: Diagnosis not present

## 2023-06-13 DIAGNOSIS — B349 Viral infection, unspecified: Secondary | ICD-10-CM | POA: Diagnosis not present

## 2023-06-13 DIAGNOSIS — E669 Obesity, unspecified: Secondary | ICD-10-CM | POA: Diagnosis not present

## 2023-06-13 DIAGNOSIS — Z91010 Allergy to peanuts: Secondary | ICD-10-CM | POA: Diagnosis not present

## 2023-06-13 DIAGNOSIS — Z20822 Contact with and (suspected) exposure to covid-19: Secondary | ICD-10-CM | POA: Diagnosis not present

## 2023-06-13 DIAGNOSIS — Z79899 Other long term (current) drug therapy: Secondary | ICD-10-CM | POA: Diagnosis not present

## 2023-07-17 ENCOUNTER — Ambulatory Visit: Payer: Self-pay

## 2023-07-17 NOTE — Telephone Encounter (Signed)
  Chief Complaint: Establish care, med refills Symptoms: Anxiety, intermittent headache Frequency: ongoing Pertinent Negatives: Patient denies all other symptoms Disposition: [] ED /[] Urgent Care (no appt availability in office) / [x] Appointment(In office/virtual)/ []  [facility]/ [] Home Care/ [] Refused Recommended Disposition /[] [facility]/ []  Follow-up with PCP Additional Notes:  Patient called in to establish care and medication refills. Mentioned anxiety and recent hospital stay and transferred to this writer for triage. Patient states she was recently discharged from the hospital, she was in 'a coma", has "blood clots". She would like to establish care to manage her neuropathy, asthma, hypertension, and mental health. She is experiencing anxiety today and intermittent mild headaches. Scheduled hospital follow up on 07/19/23, practice address provided.  Care advice discussed as documented in protocol, caller disconnected prior to confirming she understood.   Copied from CRM [id number]. Topic: Clinical - Red Word Triage >> Jul 17, 2023  9:51 AM Fuller Mandril wrote: Red Word that prompted transfer to Nurse Triage: Depression - crying / homelessness - Has fluid on knees neuropathy, bipolar schizophrenia. Just came out of coma. Reason for Disposition  MODERATE anxiety (e.g., persistent or frequent anxiety symptoms; interferes with sleep, school, or work)  Protocols used: Anxiety and Panic Attack-A-AH

## 2023-07-18 ENCOUNTER — Ambulatory Visit: Payer: Medicare HMO | Attending: Nurse Practitioner | Admitting: Nurse Practitioner

## 2023-07-18 NOTE — Progress Notes (Deleted)
 Office Visit    Patient Name: Kathryn [name] Date of Encounter: 07/18/2023  Primary Care Provider:  Jarrett Soho, PA-C Primary Cardiologist:  Reatha Harps, MD  Chief Complaint    53 year old female with a history of nonobstructive CAD, hypertension, aortic arch thrombus, CVA, polysubstance abuse, COPD, and OSA who presents for hospital follow-up related to CAD, resolved aortic arch thrombus.  Past Medical History    Past Medical History:  Diagnosis Date   Anxiety    Arthritis    Asthma    Chronic back pain    COPD (chronic obstructive pulmonary disease) (HCC)    Chronic bronchitis   Depression    Dyspnea    GERD (gastroesophageal reflux disease)    Hypertension    Migraine    Neuropathy    Osteoarthritis of left knee, 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   Single subsegmental pulmonary embolism without acute cor pulmonale (HCC) 06/20/2021   Sleep apnea 04/2020   GETTING A cpap   Suicidal ideation 12/24/2018   Past Surgical History:  Procedure Laterality Date   BACK SURGERY     DILATATION AND CURETTAGE/HYSTEROSCOPY WITH MINERVA N/A 06/09/2020   Procedure: DILATATION AND CURETTAGE/HYSTEROSCOPY WITH MINERVA;  Surgeon: Lazaro Arms, MD;  Location: [facility];  Service: Gynecology;  Laterality: N/A;   ESOPHAGOGASTRODUODENOSCOPY (EGD) WITH PROPOFOL N/A 12/25/2018   Procedure: ESOPHAGOGASTRODUODENOSCOPY (EGD) WITH PROPOFOL;  Surgeon: Malissa Hippo, MD;  Location: [facility];  Service: Endoscopy;  Laterality: N/A;   FLEXIBLE SIGMOIDOSCOPY  10/14/2021   Procedure: FLEXIBLE SIGMOIDOSCOPY;  Surgeon: Dolores Frame, MD;  Location: [facility];  Service: Gastroenterology;;   PATELLA-FEMORAL ARTHROPLASTY Left 10/08/2018   Procedure: PATELLA-FEMORAL ARTHROPLASTY;  Surgeon: Teryl Lucy, MD;  Location: [facility];  Service: Orthopedics;  Laterality: Left;   TUBAL LIGATION      Allergies  Allergies  Allergen Reactions   Fish Allergy Anaphylaxis, Shortness Of Breath  and Swelling   Flexeril [Cyclobenzaprine Hcl] Shortness Of Breath   Ibuprofen Anaphylaxis, Hives, Other (See Comments), Cough and Rash   Motrin [Ibuprofen] Anaphylaxis   Shellfish Allergy Anaphylaxis   Tylenol [Acetaminophen] Anaphylaxis   Tramadol Nausea And Vomiting    Upset stomach   Ace Inhibitors Cough   Flexeril [Cyclobenzaprine] Hives   Peanut Allergen Powder-Dnfp    Trazodone And Nefazodone Hives     Labs/Other Studies Reviewed    The following studies were reviewed today:  Cardiac Studies & Procedures   ______________________________________________________________________________________________     ECHOCARDIOGRAM  ECHOCARDIOGRAM COMPLETE 05/07/2023  Narrative ECHOCARDIOGRAM REPORT    Patient Name:   Kathryn [name] Date of Exam: 05/07/2023 Medical Rec #:  [id number]     Height:       62.0 in Accession #:    [id number]    Weight:       174.1 lb Date of Birth:  [birth year]      BSA:          1.802 m Patient Age:    53 years      BP:           115/69 mmHg Patient Gender: F             HR:           77 bpm. Exam Location:  Inpatient  Procedure: 2D Echo, 3D Echo, Cardiac Doppler and Color Doppler  Indications:    Stroke  History:        Patient has no prior history of Echocardiogram examinations.  Sonographer:  Karma Ganja Referring Phys: [phone] JARED M GARDNER   Sonographer Comments: Technically challenging study due to limited acoustic windows and no subcostal window. Image acquisition challenging due to uncooperative patient. IMPRESSIONS   1. Left ventricular ejection fraction, by estimation, is 60 to 65%. The left ventricle has normal function. The left ventricle has no regional wall motion abnormalities. There is mild left ventricular hypertrophy. Left ventricular diastolic parameters were normal. 2. Right ventricular systolic function is normal. The right ventricular size is normal. 3. The mitral valve is normal in structure. Trivial mitral valve  regurgitation. No evidence of mitral stenosis. 4. The aortic valve is tricuspid. There is mild thickening of the aortic valve. Aortic valve regurgitation is mild. No aortic stenosis is present.  FINDINGS Left Ventricle: Left ventricular ejection fraction, by estimation, is 60 to 65%. The left ventricle has normal function. The left ventricle has no regional wall motion abnormalities. The left ventricular internal cavity size was normal in size. There is mild left ventricular hypertrophy. Left ventricular diastolic parameters were normal.  Right Ventricle: The right ventricular size is normal. No increase in right ventricular wall thickness. Right ventricular systolic function is normal.  Left Atrium: Left atrial size was normal in size.  Right Atrium: Right atrial size was normal in size.  Pericardium: There is no evidence of pericardial effusion.  Mitral Valve: The mitral valve is normal in structure. Trivial mitral valve regurgitation. No evidence of mitral valve stenosis.  Tricuspid Valve: The tricuspid valve is normal in structure. Tricuspid valve regurgitation is trivial. No evidence of tricuspid stenosis.  Aortic Valve: The aortic valve is tricuspid. There is mild thickening of the aortic valve. Aortic valve regurgitation is mild. Aortic regurgitation PHT measures 393 msec. No aortic stenosis is present. Aortic valve mean gradient measures 5.0 mmHg. Aortic valve peak gradient measures 12.0 mmHg. Aortic valve area, by VTI measures 2.10 cm.  Pulmonic Valve: The pulmonic valve was normal in structure. Pulmonic valve regurgitation is trivial. No evidence of pulmonic stenosis.  Aorta: The aortic root is normal in size and structure.  IAS/Shunts: The interatrial septum was not well visualized.   LEFT VENTRICLE PLAX 2D LVIDd:         4.60 cm   Diastology LVIDs:         2.70 cm   LV e' medial:    8.24 cm/s LV PW:         1.10 cm   LV E/e' medial:  10.3 LV IVS:        1.00 cm   LV e'  lateral:   9.64 cm/s LVOT diam:     1.80 cm   LV E/e' lateral: 8.8 LV SV:         70 LV SV Index:   39 LVOT Area:     2.54 cm  3D Volume EF: 3D EF:        54 % LV EDV:       114 ml LV ESV:       53 ml LV SV:        62 ml  RIGHT VENTRICLE             IVC RV Basal diam:  3.40 cm     IVC diam: 1.70 cm RV S prime:     13.40 cm/s TAPSE (M-mode): 2.3 cm  LEFT ATRIUM             Index        RIGHT ATRIUM  Index LA diam:        4.20 cm 2.33 cm/m   RA Area:     16.20 cm LA Vol (A2C):   66.0 ml 36.62 ml/m  RA Volume:   39.60 ml  21.97 ml/m LA Vol (A4C):   46.8 ml 25.97 ml/m LA Biplane Vol: 56.3 ml 31.24 ml/m AORTIC VALVE AV Area (Vmax):    1.82 cm AV Area (Vmean):   2.00 cm AV Area (VTI):     2.10 cm AV Vmax:           173.00 cm/s AV Vmean:          103.000 cm/s AV VTI:            0.334 m AV Peak Grad:      12.0 mmHg AV Mean Grad:      5.0 mmHg LVOT Vmax:         124.00 cm/s LVOT Vmean:        80.800 cm/s LVOT VTI:          0.275 m LVOT/AV VTI ratio: 0.82 AI PHT:            393 msec  AORTA Ao Root diam: 2.90 cm  MITRAL VALVE                TRICUSPID VALVE MV Area (PHT): 3.66 cm     TR Peak grad:   20.8 mmHg MV Decel Time: 207 msec     TR Vmax:        228.00 cm/s MV E velocity: 84.70 cm/s MV A velocity: 104.00 cm/s  SHUNTS MV E/A ratio:  0.81         Systemic VTI:  0.28 m Systemic Diam: 1.80 cm  Weston Brass MD Electronically signed by Weston Brass MD Signature Date/Time: 05/07/2023/1:34:09 PM    Final      CT SCANS  CT CORONARY MORPH W/CTA COR W/SCORE 05/09/2023  Addendum 05/09/2023  6:51 PM ADDENDUM REPORT: 05/09/2023 18:49  EXAM: OVER-READ INTERPRETATION  CT CHEST  The following report is an over-read performed by radiologist Dr. Joen Laura [facility] on 05/09/2023. This over-read does not include interpretation of cardiac or coronary anatomy or pathology. The coronary CTA interpretation by the cardiologist is  attached.  COMPARISON:  05/06/2023  FINDINGS: Mediastinum: No pathologic adenopathy. Small hiatal hernia. Visualized portions of the tracheobronchial tree and esophagus are unremarkable. The intraluminal thrombus within the aortic arch seen on prior study is only partially visualized on this exam.  Lungs: Minimal tree in bud nodular airspace disease within the superior segment of the left lower lobe may be inflammatory or infectious. No effusion or pneumothorax.  Upper abdomen: Unremarkable.  Musculoskeletal: No acute or destructive bony abnormalities.  IMPRESSION: 1. Minimal tree in bud nodular airspace disease within the superior segment of the left lower lobe, consistent with inflammatory or infectious etiology. 2. Partial visualization of the known intraluminal thrombus within the aortic arch. Please see recent CT angiography exam for full description of findings. 3. Small hiatal hernia.   Electronically Signed By: Sharlet Salina M.D. On: 05/09/2023 18:49  Narrative CLINICAL DATA:  This is a 53 year old female with anginal symptoms  EXAM: Cardiac/Coronary  CTA  TECHNIQUE: The patient was scanned on a [employer].  FINDINGS: A 100 kV prospective scan was triggered in the descending thoracic aorta at 111 HU's. Axial non-contrast 3 mm slices were carried out through the heart. The data set was analyzed on a  dedicated work station and scored using the [employer] . Gantry rotation speed was 250 msecs and collimation was .6 mm. No beta blockade and 0.8 mg of sl NTG was given. The 3D data set was reconstructed in 5% intervals of the 67-82 % of the R-R cycle. Diastolic phases were analyzed on a dedicated work station using MPR, MIP and VRT modes. The patient received 80 cc of contrast.  Image Quality: Fair with misregistration artifact.  Aorta: Normal size.  No calcifications.  No dissection.  Aortic Valve:  Trileaflet.  No calcifications.  Coronary  Arteries:  Normal coronary origin.  Right dominance.  RCA is a large dominant artery that gives rise to PDA and PLA. There is a mild (25-49%) focal calcification in the mid LAD. The proximal and distal LAD with no plaques.  Left main is a large artery that gives rise to LAD and LCX arteries.  LAD is a large vessel. There is a mixed focal mild plaque in the mid LAD. The proximal and distal LAD with no plaques.  LCX is a non-dominant artery that gives rise to one large OM1 branch. There is a mix mild plaque in the proximal to mid LCX. The mid and distal LCX with no plaques.  Coronary Calcium Score:  Left main: 0  Left anterior descending artery: 35.3  Left circumflex artery: 34.7  Right coronary artery: 11.1  Total: 81.1  Percentile: 95  Other findings:  Normal pulmonary vein drainage into the left atrium.  Normal left atrial appendage without a thrombus.  Normal size of the pulmonary artery.  IMPRESSION: 1. Coronary calcium score of 81.1. This was 95 percentile for age and sex matched control.  2. Normal coronary origin with right dominance.  3. CAD-RADS 2. Mild non-obstructive CAD (25-49%). Consider non-atherosclerotic causes of chest pain. Consider preventive therapy and risk factor modification.  4. Total plaque volume is 97 mm3 which is 72nd percentile for age- and sex-matched controls (calcified plaque 11 mm3; non-calcified plaque 86 mm3, Low attenuation 1 mm3). TPV is mild.  The noncardiac portion of this study will be interpreted in separate report by the radiologist.  Electronically Signed: By: Thomasene Ripple D.O. On: 05/09/2023 15:06     ______________________________________________________________________________________________     Recent Labs: 02/15/2023: B Natriuretic Peptide 30.0 05/06/2023: Magnesium 2.3 05/24/2023: TSH 1.612 05/29/2023: ALT 20; BUN 17; Creatinine, Ser 0.77; Hemoglobin 11.5; Platelets 726; Potassium 3.8; Sodium 138  Recent  Lipid Panel    Component Value Date/Time   CHOL 157 05/07/2023 1233   TRIG 104 05/07/2023 1233   HDL 45 05/07/2023 1233   CHOLHDL 3.5 05/07/2023 1233   VLDL 21 05/07/2023 1233   LDLCALC 91 05/07/2023 1233   LDLCALC 72 04/20/2017 0955    History of Present Illness    53 year old female with the above past medical history including nonobstructive CAD, hypertension, aortic arch thrombus, CVA, polysubstance abuse (crack cocaine, tobacco, and alcohol), COPD, and OSA.  She was hospitalized in January 2025 in the setting of encephalopathy secondary to stroke, cocaine abuse, and influenza.  MRI brain was consistent with multiple areas of ischemic strokes.  CT angio chest consistent with aortic arch thrombus.  Echocardiogram showed EF 60 to 65%.  UDS was positive for cocaine.  She was evaluated by CT surgery.  Cardiology was asked to assist with perioperative management for possible aortic arch endarterectomy.  Coronary CT angiogram revealed coronary calcium score of 81.1 (95th percentile), mild nonobstructive CAD.  Repeat CTA showed resolution of thrombus, surgery was  deferred.  She was started on Eliquis with recommendations for lifelong anticoagulation therapy.  She was hospitalized again in February 2025 in the setting of suicidal ideation, psychiatric evaluation.  She presents today for follow-up.  Since her hospitalization  Nonobstructive CAD: Aortic arch thrombus: History of CVA: Hypertension: Polysubstance abuse: OSA: Disposition:  Home Medications    No current outpatient medications on file.   No current facility-administered medications for this visit.     Review of Systems    ***.  All other systems reviewed and are otherwise negative except as noted above.    Physical Exam    VS:  LMP 06/07/2020 Comment: CT showed evidence of uterus and cervix , BMI There is no height or weight on file to calculate BMI.     GEN: Well nourished, well developed, in no acute  distress. HEENT: normal. Neck: Supple, no JVD, carotid bruits, or masses. Cardiac: RRR, no murmurs, rubs, or gallops. No clubbing, cyanosis, edema.  Radials/DP/PT 2+ and equal bilaterally.  Respiratory:  Respirations regular and unlabored, clear to auscultation bilaterally. GI: Soft, nontender, nondistended, BS + x 4. MS: no deformity or atrophy. Skin: warm and dry, no rash. Neuro:  Strength and sensation are intact. Psych: Normal affect.  Accessory Clinical Findings    ECG personally reviewed by me today -    - no acute changes.   Lab Results  Component Value Date   WBC 9.0 05/29/2023   HGB 11.5 (L) 05/29/2023   HCT 35.8 (L) 05/29/2023   MCV 90.6 05/29/2023   PLT 726 (H) 05/29/2023   Lab Results  Component Value Date   CREATININE 0.77 05/29/2023   BUN 17 05/29/2023   NA 138 05/29/2023   K 3.8 05/29/2023   CL 101 05/29/2023   CO2 27 05/29/2023   Lab Results  Component Value Date   ALT 20 05/29/2023   AST 23 05/29/2023   ALKPHOS 80 05/29/2023   BILITOT 0.5 05/29/2023   Lab Results  Component Value Date   CHOL 157 05/07/2023   HDL 45 05/07/2023   LDLCALC 91 05/07/2023   TRIG 104 05/07/2023   CHOLHDL 3.5 05/07/2023    Lab Results  Component Value Date   HGBA1C 5.7 (H) 05/07/2023    Assessment & Plan    1.  ***  No BP recorded.  {Refresh Note OR Click here to enter BP  :1}***   Joylene Grapes, NP 07/18/2023, 6:23 AM

## 2023-07-19 ENCOUNTER — Inpatient Hospital Stay: Admitting: Family Medicine

## 2023-07-23 ENCOUNTER — Inpatient Hospital Stay: Admitting: Family Medicine

## 2023-08-01 ENCOUNTER — Ambulatory Visit: Admitting: Family Medicine

## 2023-08-01 ENCOUNTER — Encounter: Payer: Self-pay | Admitting: Family Medicine

## 2023-08-01 VITALS — BP 132/82 | HR 88 | Resp 18 | Ht 59.0 in | Wt 168.0 lb

## 2023-08-01 DIAGNOSIS — Z13220 Encounter for screening for lipoid disorders: Secondary | ICD-10-CM

## 2023-08-01 DIAGNOSIS — I6930 Unspecified sequelae of cerebral infarction: Secondary | ICD-10-CM | POA: Diagnosis not present

## 2023-08-01 DIAGNOSIS — K219 Gastro-esophageal reflux disease without esophagitis: Secondary | ICD-10-CM | POA: Diagnosis not present

## 2023-08-01 DIAGNOSIS — I10 Essential (primary) hypertension: Secondary | ICD-10-CM

## 2023-08-01 DIAGNOSIS — I7410 Embolism and thrombosis of unspecified parts of aorta: Secondary | ICD-10-CM | POA: Diagnosis not present

## 2023-08-01 DIAGNOSIS — Z1231 Encounter for screening mammogram for malignant neoplasm of breast: Secondary | ICD-10-CM

## 2023-08-01 DIAGNOSIS — Z5900 Homelessness unspecified: Secondary | ICD-10-CM | POA: Diagnosis not present

## 2023-08-01 DIAGNOSIS — E66811 Obesity, class 1: Secondary | ICD-10-CM | POA: Diagnosis not present

## 2023-08-01 DIAGNOSIS — G4730 Sleep apnea, unspecified: Secondary | ICD-10-CM

## 2023-08-01 DIAGNOSIS — Z79899 Other long term (current) drug therapy: Secondary | ICD-10-CM | POA: Diagnosis not present

## 2023-08-01 DIAGNOSIS — Z0000 Encounter for general adult medical examination without abnormal findings: Secondary | ICD-10-CM | POA: Diagnosis not present

## 2023-08-01 DIAGNOSIS — F339 Major depressive disorder, recurrent, unspecified: Secondary | ICD-10-CM | POA: Diagnosis not present

## 2023-08-01 DIAGNOSIS — I639 Cerebral infarction, unspecified: Secondary | ICD-10-CM

## 2023-08-01 DIAGNOSIS — F411 Generalized anxiety disorder: Secondary | ICD-10-CM

## 2023-08-01 DIAGNOSIS — H538 Other visual disturbances: Secondary | ICD-10-CM

## 2023-08-01 DIAGNOSIS — Z1329 Encounter for screening for other suspected endocrine disorder: Secondary | ICD-10-CM

## 2023-08-01 MED ORDER — PANTOPRAZOLE SODIUM 40 MG PO TBEC: 40.0000 mg | DELAYED_RELEASE_TABLET | Freq: Every day | ORAL | 1 refills | Status: DC

## 2023-08-01 MED ORDER — TRIAMCINOLONE ACETONIDE 0.1 % EX CREA: 1.0000 | TOPICAL_CREAM | Freq: Two times a day (BID) | CUTANEOUS | 0 refills | Status: DC

## 2023-08-01 MED ORDER — ELIQUIS 5 MG PO TABS: 5.0000 mg | ORAL_TABLET | Freq: Two times a day (BID) | ORAL | 0 refills | Status: DC

## 2023-08-01 MED ORDER — ALBUTEROL SULFATE HFA 108 (90 BASE) MCG/ACT IN AERS: 2.0000 | INHALATION_SPRAY | Freq: Four times a day (QID) | RESPIRATORY_TRACT | 0 refills | Status: DC | PRN

## 2023-08-01 MED ORDER — HYDROXYZINE HCL 25 MG PO TABS: 25.0000 mg | ORAL_TABLET | Freq: Three times a day (TID) | ORAL | 0 refills | Status: DC | PRN

## 2023-08-01 MED ORDER — PREGABALIN 75 MG PO CAPS
75.0000 mg | ORAL_CAPSULE | Freq: Two times a day (BID) | ORAL | 0 refills | Status: DC
Start: 2023-08-01 — End: 2023-09-12

## 2023-08-01 MED ORDER — FLUOXETINE HCL 10 MG PO CAPS: 10.0000 mg | ORAL_CAPSULE | Freq: Every day | ORAL | 3 refills | Status: DC

## 2023-08-01 MED ORDER — LIDOCAINE 5 % EX PTCH
1.0000 | MEDICATED_PATCH | CUTANEOUS | 0 refills | Status: DC
Start: 2023-08-01 — End: 2023-09-11

## 2023-08-01 NOTE — Assessment & Plan Note (Signed)
 PHQ 24. Start Prozac 10mg  daily and Hydroxyzine TID PRN, referral to psychiatry. Continue to abstain from drugs and alcohol. Seek immediate medical care for SI at local ED or call [phone].

## 2023-08-01 NOTE — Assessment & Plan Note (Signed)
 GAD 16. Start Prozac 10mg  daily and Hydroxyzine TID PRN, referral to psychiatry. Continue to abstain from drugs and alcohol. Seek immediate medical care for SI at local ED or call [phone].

## 2023-08-01 NOTE — Patient Instructions (Signed)
 It was great to meet you today and I'm excited to have you join the [facility] practice. I hope you had a positive experience today! If you feel so inclined, please feel free to recommend our practice to friends and family. Kurtis Bushman, FNP-C

## 2023-08-01 NOTE — Assessment & Plan Note (Signed)
 Well controlled on Omeprazole 20mg  daily. Mag level today

## 2023-08-01 NOTE — Assessment & Plan Note (Signed)
 Provided number for neurology and will schedule, on eliquis 5mg  BID. Proceed to ED for s/s of stroke including confusion, AMS, slurred speech, unilateral weakness, facial drooping

## 2023-08-01 NOTE — Progress Notes (Signed)
 New Patient Office Visit  Subjective    Patient ID: Kathryn [name], female    DOB: [birth year]  Age: 53 y.o. MRN: [id number]  CC:  Chief Complaint  Patient presents with   Establish Care    New patient.    HPI Kathryn [name] presents to establish care. Oriented to practice routines and expectations. Has not seen PCP in 2 years and is not seeing any medical providers. PMH includes anxiety, depression, arthritis, GERD, HTN, osteoarthritis, chronic back pain, aortic thrombus, OSA needs CPAP, prior cocaine use and homelessness.  Anxiety and Depression: uncontrolled, denies current SI/HI, denies substance or ETOH use, previously well controlled on Hydroxyzine PRN, does not recall names of other medications, will proceed to ED for emerging thoughts of suicide.  HTN: unmedicated, <140/90, denies chest pain, palpitations, recurrent headaches, vision changes, lightheadedness, dizziness, dyspnea on exertion, or swelling of extremities.  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, provided with phone number for scheduling  Substance use: currently substance free per patient, UDS today for controlled substance agreement for Lyrica     08/01/2023    8:48 AM 12/20/2021   11:27 AM 04/26/2021   10:40 AM  GAD 7 : Generalized Anxiety Score  Nervous, Anxious, on Edge 3 3 3   Control/stop worrying 3 3 3   Worry too much - different things 3 3 3   Trouble relaxing 3 3 3   Restless 1 3 3   Easily annoyed or irritable 3 1 3   Afraid - awful might happen 0 3 0  Total GAD 7 Score 16 19 18   Anxiety Difficulty Extremely difficult  Very difficult       08/01/2023    8:47 AM  02/02/2022    9:20 AM 12/20/2021   11:26 AM 11/03/2021    9:10 AM 09/06/2021    2:18 PM  Depression screen PHQ 2/9  Decreased Interest 3 0 3 0 0  Down, Depressed, Hopeless 3 0 3 0 0  PHQ - 2 Score 6 0 6 0 0  Altered sleeping 3 0 3  0  Tired, decreased energy 3 0 3  0  Change in appetite 3 0 3  0  Feeling bad or failure about yourself  3 0 3  0  Trouble concentrating 2 0 3  0  Moving slowly or fidgety/restless 1 0 0  0  Suicidal thoughts 3 0 3  0  PHQ-9 Score 24 0 24  0  Difficult doing work/chores Extremely dIfficult Not difficult at all Very difficult  Not difficult at all     Health Maintenance  Topic Date Due   Medicare Annual Wellness (AWV)  Never done   MAMMOGRAM  07/13/2021   COVID-19 Vaccine (3 - Moderna risk series) 08/17/2023 (Originally 11/25/2019)   INFLUENZA VACCINE  11/16/2023   Lung Cancer Screening  05/19/2024   Fecal DNA (Cologuard)  05/19/2024   Cervical Cancer Screening (HPV/Pap Cotest)  04/29/2025   DTaP/Tdap/Td (2 - Td or Tdap) 05/12/2027   Pneumococcal Vaccine 49-52 Years old  Completed   Hepatitis C Screening  Completed   HIV Screening  Completed   Zoster Vaccines- Shingrix  Completed   HPV VACCINES  Aged Out   Meningococcal B Vaccine  Aged Out     Outpatient Encounter Medications as of 08/01/2023  Medication Sig   FLUoxetine (PROZAC) 10 MG capsule Take 1 capsule (10 mg total) by mouth daily.   triamcinolone cream (KENALOG) 0.1 % Apply 1 Application topically 2 (two) times daily.   [DISCONTINUED] albuterol (VENTOLIN HFA) 108 (90 Base) MCG/ACT inhaler Inhale 2 puffs into the lungs every 6 (six) hours as needed.   [DISCONTINUED] amLODipine (NORVASC) 5 MG tablet Take 5 mg by mouth daily.   [DISCONTINUED] ELIQUIS 5 MG TABS tablet Take 5 mg by mouth 2 (two) times daily.   [DISCONTINUED] hydrOXYzine (ATARAX) 25 MG tablet Take 25 mg by mouth 3 (three) times daily as needed.   [DISCONTINUED] lidocaine (LIDODERM) 5 % Place 1 patch onto the skin in the morning  and at bedtime.   [DISCONTINUED] pantoprazole (PROTONIX) 40 MG tablet Take 40 mg by mouth daily.   [DISCONTINUED] pregabalin (LYRICA) 75 MG capsule Take 75 mg by mouth 2 (two) times daily.   albuterol (VENTOLIN HFA) 108 (90 Base) MCG/ACT inhaler Inhale 2 puffs into the lungs every 6 (six) hours as needed.   ELIQUIS 5 MG TABS tablet Take 1 tablet (5 mg total) by mouth 2 (two) times daily.   hydrOXYzine (ATARAX) 25 MG tablet Take 1 tablet (25 mg total) by mouth 3 (three) times daily as needed.   lidocaine (LIDODERM) 5 % Place 1 patch onto the skin daily.   pantoprazole (PROTONIX) 40 MG tablet Take 1 tablet (40 mg total) by mouth daily.   pregabalin (LYRICA) 75 MG capsule Take 1 capsule (75 mg total) by mouth 2 (two) times daily.   No facility-administered encounter medications on file as of 08/01/2023.    Past Medical History:  Diagnosis Date   Anxiety    Arthritis    Asthma    Chronic back pain    COPD (chronic obstructive pulmonary disease) (HCC)    Chronic bronchitis   Depression    Dyspnea    GERD (gastroesophageal reflux disease)    Hypertension    Migraine    Neuropathy    Osteoarthritis of left knee, 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   Single subsegmental pulmonary embolism without acute cor pulmonale (HCC) 06/20/2021   Sleep apnea 04/2020   GETTING A cpap   Suicidal ideation 12/24/2018    Past Surgical History:  Procedure Laterality Date   BACK SURGERY     DILATATION AND CURETTAGE/HYSTEROSCOPY WITH MINERVA N/A 06/09/2020   Procedure: DILATATION AND CURETTAGE/HYSTEROSCOPY WITH MINERVA;  Surgeon: Lazaro Arms, MD;  Location: [facility];  Service: Gynecology;  Laterality: N/A;   ESOPHAGOGASTRODUODENOSCOPY (EGD) WITH PROPOFOL N/A 12/25/2018   Procedure: ESOPHAGOGASTRODUODENOSCOPY (EGD) WITH PROPOFOL;  Surgeon: Malissa Hippo, MD;  Location: [facility];  Service: Endoscopy;  Laterality: N/A;   FLEXIBLE SIGMOIDOSCOPY  10/14/2021   Procedure: FLEXIBLE SIGMOIDOSCOPY;  Surgeon:  Dolores Frame, MD;  Location: [facility];  Service: Gastroenterology;;   PATELLA-FEMORAL ARTHROPLASTY Left 10/08/2018   Procedure: PATELLA-FEMORAL ARTHROPLASTY;  Surgeon: Teryl Lucy, MD;  Location: [facility];  Service: Orthopedics;  Laterality: Left;   TUBAL LIGATION      Family History  Problem Relation Age of Onset   Gout Paternal Grandfather    Cirrhosis Paternal Grandfather    Hypertension Paternal Grandmother    Aneurysm Paternal Grandmother    Cirrhosis Maternal Grandmother    Cirrhosis Maternal Grandfather    Cancer Father    Cirrhosis Father    Cirrhosis Mother    Breast cancer Sister  Hypertension Sister    Bronchitis Daughter    Bronchitis Daughter    Asthma Son    Bronchitis Son    Migraines Neg Hx     Social History   Socioeconomic History   Marital status: Single    Spouse name: Not on file   Number of children: 3   Years of education: 10   Highest education level: 10th grade  Occupational History   Not on file  Tobacco Use   Smoking status: Former    Current packs/day: 1.00    Average packs/day: 1 pack/day for 24.4 years (24.4 ttl pk-yrs)    Types: Cigarettes    Start date: 03/26/1999    Passive exposure: Never   Smokeless tobacco: Never   Tobacco comments:    Smokes often, especially when using crack cocaine  Vaping Use   Vaping status: Never Used  Substance and Sexual Activity   Alcohol use: Not Currently    Comment: states quit  06/29/22   Drug use: Yes    Types: Cocaine    Comment: last used 05/2022   Sexual activity: Yes    Birth control/protection: Surgical    Comment: tubal, ablation  Other Topics Concern   Not on file  Social History Narrative   R handed    Lives with boyfriend   1 Cup of caffeine daily    Social Drivers of Health   Financial Resource Strain: Not on file  Food Insecurity: Food Insecurity Present (05/22/2023)   Hunger Vital Sign    Worried About Running Out of Food in the Last Year: Sometimes true     Ran Out of Food in the Last Year: Sometimes true  Transportation Needs: No Transportation Needs (05/22/2023)   PRAPARE - Administrator, Civil Service (Medical): No    Lack of Transportation (Non-Medical): No  Recent Concern: Transportation Needs - Unmet Transportation Needs (03/26/2023)   PRAPARE - Administrator, Civil Service (Medical): Yes    Lack of Transportation (Non-Medical): Yes  Physical Activity: Not on file  Stress: Not on file  Social Connections: Unknown (08/30/2021)   [facility]   Social Network    Social Network: Not on file  Intimate Partner Violence: Not At Risk (05/22/2023)   Humiliation, Afraid, Rape, and Kick questionnaire    Fear of Current or Ex-Partner: No    Emotionally Abused: No    Physically Abused: No    Sexually Abused: No    Review of Systems  Constitutional: Negative.   HENT: Negative.    Eyes:  Positive for blurred vision.  Respiratory: Negative.    Cardiovascular: Negative.   Gastrointestinal: Negative.   Genitourinary: Negative.   Musculoskeletal:  Positive for back pain and joint pain.  Skin:  Positive for itching.  Neurological: Negative.   Endo/Heme/Allergies: Negative.   Psychiatric/Behavioral:  Positive for depression. The patient is nervous/anxious.   All other systems reviewed and are negative.       Objective    BP 132/82   Pulse 88   Resp 18   Ht 4\' 11"  (1.499 m)   Wt 168 lb (76.2 kg)   LMP 06/07/2020 Comment: CT showed evidence of uterus and cervix  SpO2 99%   BMI 33.93 kg/m   Physical Exam Vitals and nursing note reviewed.  Constitutional:      Appearance: Normal appearance. She is obese.     Comments: Ambulating with cane  HENT:     Head:  Normocephalic and atraumatic.     Right Ear: Tympanic membrane, ear canal and external ear normal.     Left Ear: Tympanic membrane, ear canal and external ear normal.     Nose: Nose normal.     Mouth/Throat:     Mouth:  Mucous membranes are moist.     Pharynx: Oropharynx is clear.  Eyes:     Extraocular Movements: Extraocular movements intact.     Conjunctiva/sclera: Conjunctivae normal.     Pupils: Pupils are equal, round, and reactive to light.  Cardiovascular:     Rate and Rhythm: Normal rate and regular rhythm.     Pulses: Normal pulses.     Heart sounds: Normal heart sounds.  Pulmonary:     Effort: Pulmonary effort is normal.     Breath sounds: Normal breath sounds.  Abdominal:     General: Bowel sounds are normal.     Palpations: Abdomen is soft.  Musculoskeletal:        General: Normal range of motion.     Cervical back: Normal range of motion and neck supple.  Skin:    General: Skin is warm and dry.     Capillary Refill: Capillary refill takes less than 2 seconds.     Findings: Lesion present.     Comments: Scattered lesions consistent with bug bites  Neurological:     General: No focal deficit present.     Mental Status: She is alert and oriented to person, place, and time. Mental status is at baseline.  Psychiatric:        Mood and Affect: Mood normal.        Behavior: Behavior normal.        Thought Content: Thought content normal.        Judgment: Judgment normal.         Assessment & Plan:   Problem List Items Addressed This Visit     Hypertension   Not medicated, well controlled. Recommend heart healthy diet such as Mediterranean diet with whole grains, fruits, vegetable, fish, lean meats, nuts, and olive oil. Limit salt. Encouraged moderate walking, 3-5 times/week for 30-50 minutes each session. Aim for at least 150 minutes.week. Goal should be pace of 3 miles/hours, or walking 1.5 miles in 30 minutes. Avoid tobacco products. Avoid excess alcohol. Take medications as prescribed and bring medications and blood pressure log with cuff to each office visit. Seek medical care for chest pain, palpitations, shortness of breath with exertion, dizziness/lightheadedness, vision  changes, recurrent headaches, or swelling of extremities. Follow up in 4 weeks or sooner if needed      Relevant Medications   ELIQUIS 5 MG TABS tablet   Other Relevant Orders   CBC with Differential/Platelet   Comprehensive metabolic panel with GFR   Lipid panel   TSH   Hemoglobin A1c   GAD (generalized anxiety disorder)   GAD 16. Start Prozac 10mg  daily and Hydroxyzine TID PRN, referral to psychiatry. Continue to abstain from drugs and alcohol. Seek immediate medical care for SI at local ED or call [phone].      Relevant Medications   hydrOXYzine (ATARAX) 25 MG tablet   FLUoxetine (PROZAC) 10 MG capsule   Other Relevant Orders   Vitamin B12   VITAMIN D 25 Hydroxy (Vit-D Deficiency, Fractures)   Ambulatory referral to Psychiatry   Homelessness   Referral to social work, sleeping at a friends house, they have bed bugs. Limited transportation.      Relevant Orders  Ambulatory referral to Social Work   Acute embolic stroke [facility])   Provided number for neurology and will schedule, on eliquis 5mg  BID. Proceed to ED for s/s of stroke including confusion, AMS, slurred speech, unilateral weakness, facial drooping      Relevant Medications   ELIQUIS 5 MG TABS tablet   Aortic thrombus (HCC)   On Eliquis 5mg  BID.   Per ED visit: atient had significant clot burden at her aortic arch.  Primary cause unknown.  Active cocaine use Repeat CTA with resolved AA thrombus CT surgery consulted, now with resolved thrombus, no further recs, signed off She will need lifelong anticoagulation, started on PO Eliquis on 1/30      Relevant Medications   ELIQUIS 5 MG TABS tablet   Physical exam, annual - Primary   Today your medical history was reviewed and routine physical exam with labs was performed. Recommend 150 minutes of moderate intensity exercise weekly and consuming a well-balanced diet. Advised to stop smoking if a smoker, avoid smoking if a non-smoker, limit alcohol consumption to 1 drink  per day for women and 2 drinks per day for men, and avoid illicit drug use. Counseled on safe sex practices and offered STI testing today.  Counseled in mental health awareness and when to seek medical care. Vaccine maintenance discussed. Appropriate health maintenance items reviewed. Return to office in 1 year for annual physical exam.       Relevant Orders   CBC with Differential/Platelet   Comprehensive metabolic panel with GFR   Lipid panel   TSH   Hemoglobin A1c   Vitamin B12   VITAMIN D 25 Hydroxy (Vit-D Deficiency, Fractures)   MM DIGITAL SCREENING BILATERAL   DRUG MONITOR, PANEL 1, W/CONF, URINE   Magnesium   Depression, recurrent (HCC)   PHQ 24. Start Prozac 10mg  daily and Hydroxyzine TID PRN, referral to psychiatry. Continue to abstain from drugs and alcohol. Seek immediate medical care for SI at local ED or call [phone].      Relevant Medications   hydrOXYzine (ATARAX) 25 MG tablet   FLUoxetine (PROZAC) 10 MG capsule   Other Relevant Orders   Vitamin B12   VITAMIN D 25 Hydroxy (Vit-D Deficiency, Fractures)   Ambulatory referral to Psychiatry   Gastroesophageal reflux disease without esophagitis   Well controlled on Omeprazole 20mg  daily. Mag level today      Relevant Medications   pantoprazole (PROTONIX) 40 MG tablet   Other Visit Diagnoses       Encounter for screening mammogram for malignant neoplasm of breast       Relevant Orders   MM DIGITAL SCREENING BILATERAL     Screening for lipoid disorders       Relevant Orders   Lipid panel     Screening for thyroid disorder       Relevant Orders   TSH     Medication management       Relevant Orders   DRUG MONITOR, PANEL 1, W/CONF, URINE   Magnesium     Controlled substance agreement signed       Relevant Orders   DRUG MONITOR, PANEL 1, W/CONF, URINE     Obesity (BMI 30.0-34.9)       Relevant Orders   CBC with Differential/Platelet   Comprehensive metabolic panel with GFR   Lipid panel   TSH   Hemoglobin A1c      Sleep apnea, unspecified type       Relevant Orders   Ambulatory referral to  Sleep Studies     Blurred vision, bilateral       Relevant Orders   Ambulatory referral to Ophthalmology       Return in about 4 weeks (around 08/29/2023) for AWV, anxiety/depression.   Jenelle Mis, FNP

## 2023-08-01 NOTE — Assessment & Plan Note (Signed)
 Not medicated, well controlled. Recommend heart healthy diet such as Mediterranean diet with whole grains, fruits, vegetable, fish, lean meats, nuts, and olive oil. Limit salt. Encouraged moderate walking, 3-5 times/week for 30-50 minutes each session. Aim for at least 150 minutes.week. Goal should be pace of 3 miles/hours, or walking 1.5 miles in 30 minutes. Avoid tobacco products. Avoid excess alcohol. Take medications as prescribed and bring medications and blood pressure log with cuff to each office visit. Seek medical care for chest pain, palpitations, shortness of breath with exertion, dizziness/lightheadedness, vision changes, recurrent headaches, or swelling of extremities. Follow up in 4 weeks or sooner if needed

## 2023-08-01 NOTE — Assessment & Plan Note (Signed)
 Today your medical history was reviewed and routine physical exam with labs was performed. Recommend 150 minutes of moderate intensity exercise weekly and consuming a well-balanced diet. Advised to stop smoking if a smoker, avoid smoking if a non-smoker, limit alcohol consumption to 1 drink per day for women and 2 drinks per day for men, and avoid illicit drug use. Counseled on safe sex practices and offered STI testing today. Counseled in mental health awareness and when to seek medical care. Vaccine maintenance discussed. Appropriate health maintenance items reviewed. Return to office in 1 year for annual physical exam.

## 2023-08-01 NOTE — Assessment & Plan Note (Signed)
 Referral to social work, sleeping at a friends house, they have bed bugs. Limited transportation.

## 2023-08-01 NOTE — Assessment & Plan Note (Addendum)
 On Eliquis 5mg  BID.   Per ED visit: atient had significant clot burden at her aortic arch.  Primary cause unknown.  Active cocaine use Repeat CTA with resolved AA thrombus CT surgery consulted, now with resolved thrombus, no further recs, signed off She will need lifelong anticoagulation, started on PO Eliquis on 1/30

## 2023-08-02 ENCOUNTER — Telehealth: Payer: Self-pay | Admitting: *Deleted

## 2023-08-02 NOTE — Progress Notes (Signed)
 Complex Care Management Note Care Guide Note  08/02/2023 Name: Kathryn [name] MRN: [id number] DOB: [birth year]   Complex Care Management Outreach Attempts: An unsuccessful telephone outreach was attempted today to offer the patient information about available complex care management services.  Follow Up Plan:  Additional outreach attempts will be made to offer the patient complex care management information and services.   Encounter Outcome:  No Answer  Barnie Bora  [facility]  [facility], [facility] Guide  Direct Dial: [phone]  Fax [phone]

## 2023-08-03 LAB — DRUG MONITOR, PANEL 1, W/CONF, URINE
Amphetamines: NEGATIVE ng/mL (ref <500)
Barbiturates: NEGATIVE ng/mL (ref <300)
Benzodiazepines: NEGATIVE ng/mL (ref <100)
Benzoylecgonine: >20000 ng/mL — ABNORMAL HIGH (ref <100)
Cocaine Metabolite: POSITIVE ng/mL — AB (ref <150)
Creatinine: 186 mg/dL (ref >=20.0)
Marijuana Metabolite: NEGATIVE ng/mL (ref <20)
Methadone Metabolite: NEGATIVE ng/mL (ref <100)
Opiates: NEGATIVE ng/mL (ref <100)
Oxidant: NEGATIVE ug/mL (ref <200)
Oxycodone: NEGATIVE ng/mL (ref <100)
Phencyclidine: NEGATIVE ng/mL (ref <25)
pH: 5.7 (ref 4.5–9.0)

## 2023-08-03 LAB — DM TEMPLATE

## 2023-08-13 NOTE — Progress Notes (Signed)
 Complex Care Management Note Care Guide Note  08/13/2023 Name: Kathryn [name] MRN: [id number] DOB: 08/21/1970   Complex Care Management Outreach Attempts: A second unsuccessful outreach was attempted today to offer the patient with information about available complex care management services.  Follow Up Plan:  Additional outreach attempts will be made to offer the patient complex care management information and services.   Encounter Outcome:  No Answer  Barnie Bora  [facility]  [facility], [facility] Guide  Direct Dial: [phone]  Fax [phone]

## 2023-08-13 NOTE — Progress Notes (Signed)
 Complex Care Management Note Care Guide Note  08/13/2023 Name: Kathryn [name] MRN: [id number] DOB: [birth year]   Complex Care Management Outreach Attempts: A third unsuccessful outreach was attempted today to offer the patient with information about available complex care management services.  Follow Up Plan:  No further outreach attempts will be made at this time. We have been unable to contact the patient to offer or enroll patient in complex care management services.  Encounter Outcome:  No Answer  Barnie Bora  [facility]  [facility], [facility] Guide  Direct Dial: [phone]  Fax [phone]

## 2023-08-21 ENCOUNTER — Ambulatory Visit: Admitting: Family Medicine

## 2023-09-04 ENCOUNTER — Ambulatory Visit: Admitting: Family Medicine

## 2023-09-07 ENCOUNTER — Other Ambulatory Visit: Payer: Self-pay | Admitting: Family Medicine

## 2023-09-07 NOTE — Telephone Encounter (Unsigned)
 Copied from CRM [id number]. Topic: Clinical - Medication Refill >> Sep 07, 2023  9:43 AM [facility] wrote: Medication: ELIQUIS  5 MG TABS tablet lidocaine  (LIDODERM ) 5 % pregabalin  (LYRICA ) 75 MG capsule pantoprazole  (PROTONIX ) 40 MG tablet albuterol  (VENTOLIN  HFA) 108 (90 Base) MCG/ACT inhaler FLUoxetine  (PROZAC ) 10 MG capsule hydrOXYzine  (ATARAX ) 25 MG tablet triamcinolone  cream (KENALOG ) 0.1 %  Has the patient contacted their pharmacy? No (Agent: If no, request that the patient contact the pharmacy for the refill. If patient does not wish to contact the pharmacy document the reason why and proceed with request.) (Agent: If yes, when and what did the pharmacy advise?)  This is the patient's preferred pharmacy:  [facility] - [place], [place] - [address] [place] [address] Phone: [phone] Fax: [phone]  Is this the correct pharmacy for this prescription? Yes If no, delete pharmacy and type the correct one.   Has the prescription been filled recently? Yes  Is the patient out of the medication? Yes  Has the patient been seen for an appointment in the last year OR does the patient have an upcoming appointment? Yes  Can we respond through MyChart? Yes  Agent: Please be advised that Rx refills may take up to 3 business days. We ask that you follow-up with your pharmacy.

## 2023-09-11 ENCOUNTER — Other Ambulatory Visit: Payer: Self-pay

## 2023-09-11 ENCOUNTER — Telehealth: Payer: Self-pay

## 2023-09-11 DIAGNOSIS — M4316 Spondylolisthesis, lumbar region: Secondary | ICD-10-CM

## 2023-09-11 MED ORDER — ALBUTEROL SULFATE HFA 108 (90 BASE) MCG/ACT IN AERS: 2.0000 | INHALATION_SPRAY | Freq: Four times a day (QID) | RESPIRATORY_TRACT | 0 refills | Status: DC | PRN

## 2023-09-11 MED ORDER — HYDROXYZINE HCL 25 MG PO TABS: 25.0000 mg | ORAL_TABLET | Freq: Three times a day (TID) | ORAL | 0 refills | Status: DC | PRN

## 2023-09-11 MED ORDER — LIDOCAINE 5 % EX PTCH: 1.0000 | MEDICATED_PATCH | CUTANEOUS | 0 refills | Status: DC

## 2023-09-11 MED ORDER — ELIQUIS 5 MG PO TABS: 5.0000 mg | ORAL_TABLET | Freq: Two times a day (BID) | ORAL | 0 refills | Status: DC

## 2023-09-11 NOTE — Telephone Encounter (Signed)
 Requested medication (s) are due for refill today -provider review  Requested medication (s) are on the active medication list -yes  Future visit scheduled -yes  Last refill: 08/01/23  Notes to clinic: non delegated Rx, manual review Rx- sent for PCP review of request  Requested Prescriptions  Pending Prescriptions Disp Refills   triamcinolone  cream (KENALOG ) 0.1 % 30 g 0    Sig: Apply 1 Application topically 2 (two) times daily.     Not Delegated - Dermatology:  Corticosteroids Failed - 09/11/2023 12:54 PM      Failed - This refill cannot be delegated      Failed - Valid encounter within last 12 months    Recent Outpatient Visits           1 month ago Physical exam, annual   [place] [facility] Jenelle Mis, FNP               pregabalin  (LYRICA ) 75 MG capsule 60 capsule 0    Sig: Take 1 capsule (75 mg total) by mouth 2 (two) times daily.     Not Delegated - Neurology:  Anticonvulsants - Controlled - pregabalin  Failed - 09/11/2023 12:54 PM      Failed - This refill cannot be delegated      Failed - Valid encounter within last 12 months    Recent Outpatient Visits           1 month ago Physical exam, annual   [facility] Jenelle Mis, FNP              Passed - Cr in normal range and within 360 days    Creat  Date Value Ref Range Status  04/20/2017 0.74 0.50 - 1.10 mg/dL Final   Creatinine, Ser  Date Value Ref Range Status  05/29/2023 0.77 0.44 - 1.00 mg/dL Final         Passed - Completed PHQ-2 or PHQ-9 in the last 360 days       lidocaine  (LIDODERM ) 5 % 30 patch 0    Sig: Place 1 patch onto the skin daily.     Analgesics:  Topicals Failed - 09/11/2023 12:54 PM      Failed - Manual Review: Labs are only required if the patient has taken medication for more than 8 weeks.      Failed - PLT in normal range and within 360 days    Platelets  Date Value Ref Range Status  05/29/2023 726 (H) 150 - 400  K/uL Final  04/26/2020 624 (H) 150 - 450 x10E3/uL Final   Platelet Count  Date Value Ref Range Status  07/15/2021 706 (H) 150 - 400 K/uL Final         Failed - HGB in normal range and within 360 days    Hemoglobin  Date Value Ref Range Status  05/29/2023 11.5 (L) 12.0 - 15.0 g/dL Final  16/01/9603 54.0 12.0 - 15.0 g/dL Final  98/02/9146 82.9 11.1 - 15.9 g/dL Final         Failed - HCT in normal range and within 360 days    HCT  Date Value Ref Range Status  05/29/2023 35.8 (L) 36.0 - 46.0 % Final   Hematocrit  Date Value Ref Range Status  04/26/2020 39.6 34.0 - 46.6 % Final         Failed - Valid encounter within last 12 months    Recent Outpatient Visits  1 month ago Physical exam, annual   [facility] Medicine Jenelle Mis, FNP              Passed - Cr in normal range and within 360 days    Creat  Date Value Ref Range Status  04/20/2017 0.74 0.50 - 1.10 mg/dL Final   Creatinine, Ser  Date Value Ref Range Status  05/29/2023 0.77 0.44 - 1.00 mg/dL Final         Passed - eGFR is 30 or above and within 360 days    GFR, Est African American  Date Value Ref Range Status  04/20/2017 111 > OR = 60 mL/min/1.39m2 Final   GFR calc Af Amer  Date Value Ref Range Status  12/26/2018 >60 >60 mL/min Final   GFR, Est Non African American  Date Value Ref Range Status  04/20/2017 96 > OR = 60 mL/min/1.27m2 Final   GFR, Estimated  Date Value Ref Range Status  05/29/2023 >60 >60 mL/min Final    Comment:    (NOTE) Calculated using the CKD-EPI Creatinine Equation (2021)          Passed - Patient is not pregnant      Signed Prescriptions Disp Refills   hydrOXYzine  (ATARAX ) 25 MG tablet 90 tablet 0    Sig: Take 1 tablet (25 mg total) by mouth 3 (three) times daily as needed.     Ear, Nose, and Throat:  Antihistamines 2 Failed - 09/11/2023 12:54 PM      Failed - Valid encounter within last 12 months    Recent Outpatient Visits            1 month ago Physical exam, annual   [place] [facility] Jenelle Mis, FNP              Passed - Cr in normal range and within 360 days    Creat  Date Value Ref Range Status  04/20/2017 0.74 0.50 - 1.10 mg/dL Final   Creatinine, Ser  Date Value Ref Range Status  05/29/2023 0.77 0.44 - 1.00 mg/dL Final          albuterol  (VENTOLIN  HFA) 108 (90 Base) MCG/ACT inhaler 18 g 0    Sig: Inhale 2 puffs into the lungs every 6 (six) hours as needed.     Pulmonology:  Beta Agonists 2 Failed - 09/11/2023 12:54 PM      Failed - Valid encounter within last 12 months    Recent Outpatient Visits           1 month ago Physical exam, annual    [facility] Medicine Jenelle Mis, FNP              Passed - Last BP in normal range    BP Readings from Last 1 Encounters:  08/01/23 132/82         Passed - Last Heart Rate in normal range    Pulse Readings from Last 1 Encounters:  08/01/23 88          ELIQUIS  5 MG TABS tablet 60 tablet 0    Sig: Take 1 tablet (5 mg total) by mouth 2 (two) times daily.     Hematology:  Anticoagulants - apixaban  Failed - 09/11/2023 12:54 PM      Failed - PLT in normal range and within 360 days    Platelets  Date Value Ref Range Status  05/29/2023 726 (H) 150 - 400  K/uL Final  04/26/2020 624 (H) 150 - 450 x10E3/uL Final   Platelet Count  Date Value Ref Range Status  07/15/2021 706 (H) 150 - 400 K/uL Final         Failed - HGB in normal range and within 360 days    Hemoglobin  Date Value Ref Range Status  05/29/2023 11.5 (L) 12.0 - 15.0 g/dL Final  16/01/9603 54.0 12.0 - 15.0 g/dL Final  98/02/9146 82.9 11.1 - 15.9 g/dL Final         Failed - HCT in normal range and within 360 days    HCT  Date Value Ref Range Status  05/29/2023 35.8 (L) 36.0 - 46.0 % Final   Hematocrit  Date Value Ref Range Status  04/26/2020 39.6 34.0 - 46.6 % Final         Failed - Valid encounter within last 12  months    Recent Outpatient Visits           1 month ago Physical exam, annual   [facility] Jenelle Mis, FNP              Passed - Cr in normal range and within 360 days    Creat  Date Value Ref Range Status  04/20/2017 0.74 0.50 - 1.10 mg/dL Final   Creatinine, Ser  Date Value Ref Range Status  05/29/2023 0.77 0.44 - 1.00 mg/dL Final         Passed - AST in normal range and within 360 days    AST  Date Value Ref Range Status  05/29/2023 23 15 - 41 U/L Final         Passed - ALT in normal range and within 360 days    ALT  Date Value Ref Range Status  05/29/2023 20 0 - 44 U/L Final         Refused Prescriptions Disp Refills   pantoprazole  (PROTONIX ) 40 MG tablet 90 tablet 1    Sig: Take 1 tablet (40 mg total) by mouth daily.     Gastroenterology: Proton Pump Inhibitors Failed - 09/11/2023 12:54 PM      Failed - Valid encounter within last 12 months    Recent Outpatient Visits           1 month ago Physical exam, annual   [place] [facility] Jenelle Mis, FNP               FLUoxetine  (PROZAC ) 10 MG capsule 90 capsule 3    Sig: Take 1 capsule (10 mg total) by mouth daily.     Psychiatry:  Antidepressants - SSRI Failed - 09/11/2023 12:54 PM      Failed - Valid encounter within last 6 months    Recent Outpatient Visits           1 month ago Physical exam, annual   [place] [facility] Jenelle Mis, FNP              Passed - Completed PHQ-2 or PHQ-9 in the last 360 days         Requested Prescriptions  Pending Prescriptions Disp Refills   triamcinolone  cream (KENALOG ) 0.1 % 30 g 0    Sig: Apply 1 Application topically 2 (two) times daily.     Not Delegated - Dermatology:  Corticosteroids Failed - 09/11/2023 12:54 PM      Failed - This refill cannot be delegated  Failed - Valid encounter within last 12 months    Recent Outpatient Visits           1 month  ago Physical exam, annual   [place] [facility] Jenelle Mis, FNP               pregabalin  (LYRICA ) 75 MG capsule 60 capsule 0    Sig: Take 1 capsule (75 mg total) by mouth 2 (two) times daily.     Not Delegated - Neurology:  Anticonvulsants - Controlled - pregabalin  Failed - 09/11/2023 12:54 PM      Failed - This refill cannot be delegated      Failed - Valid encounter within last 12 months    Recent Outpatient Visits           1 month ago Physical exam, annual   [place] [facility] Jenelle Mis, FNP              Passed - Cr in normal range and within 360 days    Creat  Date Value Ref Range Status  04/20/2017 0.74 0.50 - 1.10 mg/dL Final   Creatinine, Ser  Date Value Ref Range Status  05/29/2023 0.77 0.44 - 1.00 mg/dL Final         Passed - Completed PHQ-2 or PHQ-9 in the last 360 days       lidocaine  (LIDODERM ) 5 % 30 patch 0    Sig: Place 1 patch onto the skin daily.     Analgesics:  Topicals Failed - 09/11/2023 12:54 PM      Failed - Manual Review: Labs are only required if the patient has taken medication for more than 8 weeks.      Failed - PLT in normal range and within 360 days    Platelets  Date Value Ref Range Status  05/29/2023 726 (H) 150 - 400 K/uL Final  04/26/2020 624 (H) 150 - 450 x10E3/uL Final   Platelet Count  Date Value Ref Range Status  07/15/2021 706 (H) 150 - 400 K/uL Final         Failed - HGB in normal range and within 360 days    Hemoglobin  Date Value Ref Range Status  05/29/2023 11.5 (L) 12.0 - 15.0 g/dL Final  65/78/4696 29.5 12.0 - 15.0 g/dL Final  28/41/3244 01.0 11.1 - 15.9 g/dL Final         Failed - HCT in normal range and within 360 days    HCT  Date Value Ref Range Status  05/29/2023 35.8 (L) 36.0 - 46.0 % Final   Hematocrit  Date Value Ref Range Status  04/26/2020 39.6 34.0 - 46.6 % Final         Failed - Valid encounter within last 12 months    Recent  Outpatient Visits           1 month ago Physical exam, annual   [place] [facility] Medicine Jenelle Mis, FNP              Passed - Cr in normal range and within 360 days    Creat  Date Value Ref Range Status  04/20/2017 0.74 0.50 - 1.10 mg/dL Final   Creatinine, Ser  Date Value Ref Range Status  05/29/2023 0.77 0.44 - 1.00 mg/dL Final         Passed - eGFR is 30 or above and within 360 days    GFR, Est  African American  Date Value Ref Range Status  04/20/2017 111 > OR = 60 mL/min/1.60m2 Final   GFR calc Af Amer  Date Value Ref Range Status  12/26/2018 >60 >60 mL/min Final   GFR, Est Non African American  Date Value Ref Range Status  04/20/2017 96 > OR = 60 mL/min/1.71m2 Final   GFR, Estimated  Date Value Ref Range Status  05/29/2023 >60 >60 mL/min Final    Comment:    (NOTE) Calculated using the CKD-EPI Creatinine Equation (2021)          Passed - Patient is not pregnant      Signed Prescriptions Disp Refills   hydrOXYzine  (ATARAX ) 25 MG tablet 90 tablet 0    Sig: Take 1 tablet (25 mg total) by mouth 3 (three) times daily as needed.     Ear, Nose, and Throat:  Antihistamines 2 Failed - 09/11/2023 12:54 PM      Failed - Valid encounter within last 12 months    Recent Outpatient Visits           1 month ago Physical exam, annual   [place] [facility] Jenelle Mis, FNP              Passed - Cr in normal range and within 360 days    Creat  Date Value Ref Range Status  04/20/2017 0.74 0.50 - 1.10 mg/dL Final   Creatinine, Ser  Date Value Ref Range Status  05/29/2023 0.77 0.44 - 1.00 mg/dL Final          albuterol  (VENTOLIN  HFA) 108 (90 Base) MCG/ACT inhaler 18 g 0    Sig: Inhale 2 puffs into the lungs every 6 (six) hours as needed.     Pulmonology:  Beta Agonists 2 Failed - 09/11/2023 12:54 PM      Failed - Valid encounter within last 12 months    Recent Outpatient Visits           1 month ago  Physical exam, annual   [place] [facility] Jenelle Mis, FNP              Passed - Last BP in normal range    BP Readings from Last 1 Encounters:  08/01/23 132/82         Passed - Last Heart Rate in normal range    Pulse Readings from Last 1 Encounters:  08/01/23 88          ELIQUIS  5 MG TABS tablet 60 tablet 0    Sig: Take 1 tablet (5 mg total) by mouth 2 (two) times daily.     Hematology:  Anticoagulants - apixaban  Failed - 09/11/2023 12:54 PM      Failed - PLT in normal range and within 360 days    Platelets  Date Value Ref Range Status  05/29/2023 726 (H) 150 - 400 K/uL Final  04/26/2020 624 (H) 150 - 450 x10E3/uL Final   Platelet Count  Date Value Ref Range Status  07/15/2021 706 (H) 150 - 400 K/uL Final         Failed - HGB in normal range and within 360 days    Hemoglobin  Date Value Ref Range Status  05/29/2023 11.5 (L) 12.0 - 15.0 g/dL Final  32/44/0102 72.5 12.0 - 15.0 g/dL Final  36/64/4034 74.2 11.1 - 15.9 g/dL Final         Failed - HCT in normal range and within 360 days  HCT  Date Value Ref Range Status  05/29/2023 35.8 (L) 36.0 - 46.0 % Final   Hematocrit  Date Value Ref Range Status  04/26/2020 39.6 34.0 - 46.6 % Final         Failed - Valid encounter within last 12 months    Recent Outpatient Visits           1 month ago Physical exam, annual   [place] [facility] Jenelle Mis, FNP              Passed - Cr in normal range and within 360 days    Creat  Date Value Ref Range Status  04/20/2017 0.74 0.50 - 1.10 mg/dL Final   Creatinine, Ser  Date Value Ref Range Status  05/29/2023 0.77 0.44 - 1.00 mg/dL Final         Passed - AST in normal range and within 360 days    AST  Date Value Ref Range Status  05/29/2023 23 15 - 41 U/L Final         Passed - ALT in normal range and within 360 days    ALT  Date Value Ref Range Status  05/29/2023 20 0 - 44 U/L Final          Refused Prescriptions Disp Refills   pantoprazole  (PROTONIX ) 40 MG tablet 90 tablet 1    Sig: Take 1 tablet (40 mg total) by mouth daily.     Gastroenterology: Proton Pump Inhibitors Failed - 09/11/2023 12:54 PM      Failed - Valid encounter within last 12 months    Recent Outpatient Visits           1 month ago Physical exam, annual   [place] [facility] Yolanda Hence S, FNP               FLUoxetine  (PROZAC ) 10 MG capsule 90 capsule 3    Sig: Take 1 capsule (10 mg total) by mouth daily.     Psychiatry:  Antidepressants - SSRI Failed - 09/11/2023 12:54 PM      Failed - Valid encounter within last 6 months    Recent Outpatient Visits           1 month ago Physical exam, annual    [facility] Jenelle Mis, FNP              Passed - Completed PHQ-2 or PHQ-9 in the last 360 days

## 2023-09-11 NOTE — Telephone Encounter (Signed)
 Requested Prescriptions  Pending Prescriptions Disp Refills   triamcinolone  cream (KENALOG ) 0.1 % 30 g 0    Sig: Apply 1 Application topically 2 (two) times daily.     Not Delegated - Dermatology:  Corticosteroids Failed - 09/11/2023 12:52 PM      Failed - This refill cannot be delegated      Failed - Valid encounter within last 12 months    Recent Outpatient Visits           1 month ago Physical exam, annual   [facility] Jenelle Mis, FNP               pregabalin  (LYRICA ) 75 MG capsule 60 capsule 0    Sig: Take 1 capsule (75 mg total) by mouth 2 (two) times daily.     Not Delegated - Neurology:  Anticonvulsants - Controlled - pregabalin  Failed - 09/11/2023 12:52 PM      Failed - This refill cannot be delegated      Failed - Valid encounter within last 12 months    Recent Outpatient Visits           1 month ago Physical exam, annual   [place] [facility] Jenelle Mis, FNP              Passed - Cr in normal range and within 360 days    Creat  Date Value Ref Range Status  04/20/2017 0.74 0.50 - 1.10 mg/dL Final   Creatinine, Ser  Date Value Ref Range Status  05/29/2023 0.77 0.44 - 1.00 mg/dL Final         Passed - Completed PHQ-2 or PHQ-9 in the last 360 days       pantoprazole  (PROTONIX ) 40 MG tablet 90 tablet 1    Sig: Take 1 tablet (40 mg total) by mouth daily.     Gastroenterology: Proton Pump Inhibitors Failed - 09/11/2023 12:52 PM      Failed - Valid encounter within last 12 months    Recent Outpatient Visits           1 month ago Physical exam, annual   [place] [facility] Yolanda Hence S, FNP               lidocaine  (LIDODERM ) 5 % 30 patch 0    Sig: Place 1 patch onto the skin daily.     Analgesics:  Topicals Failed - 09/11/2023 12:52 PM      Failed - Manual Review: Labs are only required if the patient has taken medication for more than 8 weeks.      Failed -  PLT in normal range and within 360 days    Platelets  Date Value Ref Range Status  05/29/2023 726 (H) 150 - 400 K/uL Final  04/26/2020 624 (H) 150 - 450 x10E3/uL Final   Platelet Count  Date Value Ref Range Status  07/15/2021 706 (H) 150 - 400 K/uL Final         Failed - HGB in normal range and within 360 days    Hemoglobin  Date Value Ref Range Status  05/29/2023 11.5 (L) 12.0 - 15.0 g/dL Final  21/30/8657 84.6 12.0 - 15.0 g/dL Final  96/29/5284 13.2 11.1 - 15.9 g/dL Final         Failed - HCT in normal range and within 360 days    HCT  Date Value Ref Range Status  05/29/2023 35.8 (L)  36.0 - 46.0 % Final   Hematocrit  Date Value Ref Range Status  04/26/2020 39.6 34.0 - 46.6 % Final         Failed - Valid encounter within last 12 months    Recent Outpatient Visits           1 month ago Physical exam, annual   [facility] Medicine Jenelle Mis, FNP              Passed - Cr in normal range and within 360 days    Creat  Date Value Ref Range Status  04/20/2017 0.74 0.50 - 1.10 mg/dL Final   Creatinine, Ser  Date Value Ref Range Status  05/29/2023 0.77 0.44 - 1.00 mg/dL Final         Passed - eGFR is 30 or above and within 360 days    GFR, Est African American  Date Value Ref Range Status  04/20/2017 111 > OR = 60 mL/min/1.7m2 Final   GFR calc Af Amer  Date Value Ref Range Status  12/26/2018 >60 >60 mL/min Final   GFR, Est Non African American  Date Value Ref Range Status  04/20/2017 96 > OR = 60 mL/min/1.42m2 Final   GFR, Estimated  Date Value Ref Range Status  05/29/2023 >60 >60 mL/min Final    Comment:    (NOTE) Calculated using the CKD-EPI Creatinine Equation (2021)          Passed - Patient is not pregnant       hydrOXYzine  (ATARAX ) 25 MG tablet 90 tablet 0    Sig: Take 1 tablet (25 mg total) by mouth 3 (three) times daily as needed.     Ear, Nose, and Throat:  Antihistamines 2 Failed - 09/11/2023 12:52 PM       Failed - Valid encounter within last 12 months    Recent Outpatient Visits           1 month ago Physical exam, annual   [place] [facility] Medicine Jenelle Mis, FNP              Passed - Cr in normal range and within 360 days    Creat  Date Value Ref Range Status  04/20/2017 0.74 0.50 - 1.10 mg/dL Final   Creatinine, Ser  Date Value Ref Range Status  05/29/2023 0.77 0.44 - 1.00 mg/dL Final          albuterol  (VENTOLIN  HFA) 108 (90 Base) MCG/ACT inhaler 18 g 0    Sig: Inhale 2 puffs into the lungs every 6 (six) hours as needed.     Pulmonology:  Beta Agonists 2 Failed - 09/11/2023 12:52 PM      Failed - Valid encounter within last 12 months    Recent Outpatient Visits           1 month ago Physical exam, annual   [place] [facility] Medicine Jenelle Mis, FNP              Passed - Last BP in normal range    BP Readings from Last 1 Encounters:  08/01/23 132/82         Passed - Last Heart Rate in normal range    Pulse Readings from Last 1 Encounters:  08/01/23 88          ELIQUIS  5 MG TABS tablet 60 tablet 0    Sig: Take 1 tablet (5 mg total) by mouth 2 (  two) times daily.     Hematology:  Anticoagulants - apixaban  Failed - 09/11/2023 12:52 PM      Failed - PLT in normal range and within 360 days    Platelets  Date Value Ref Range Status  05/29/2023 726 (H) 150 - 400 K/uL Final  04/26/2020 624 (H) 150 - 450 x10E3/uL Final   Platelet Count  Date Value Ref Range Status  07/15/2021 706 (H) 150 - 400 K/uL Final         Failed - HGB in normal range and within 360 days    Hemoglobin  Date Value Ref Range Status  05/29/2023 11.5 (L) 12.0 - 15.0 g/dL Final  96/29/5284 13.2 12.0 - 15.0 g/dL Final  44/04/270 53.6 11.1 - 15.9 g/dL Final         Failed - HCT in normal range and within 360 days    HCT  Date Value Ref Range Status  05/29/2023 35.8 (L) 36.0 - 46.0 % Final   Hematocrit  Date Value Ref Range Status   04/26/2020 39.6 34.0 - 46.6 % Final         Failed - Valid encounter within last 12 months    Recent Outpatient Visits           1 month ago Physical exam, annual   [place] [facility] Jenelle Mis, FNP              Passed - Cr in normal range and within 360 days    Creat  Date Value Ref Range Status  04/20/2017 0.74 0.50 - 1.10 mg/dL Final   Creatinine, Ser  Date Value Ref Range Status  05/29/2023 0.77 0.44 - 1.00 mg/dL Final         Passed - AST in normal range and within 360 days    AST  Date Value Ref Range Status  05/29/2023 23 15 - 41 U/L Final         Passed - ALT in normal range and within 360 days    ALT  Date Value Ref Range Status  05/29/2023 20 0 - 44 U/L Final          FLUoxetine  (PROZAC ) 10 MG capsule 90 capsule 3    Sig: Take 1 capsule (10 mg total) by mouth daily.     Psychiatry:  Antidepressants - SSRI Failed - 09/11/2023 12:52 PM      Failed - Valid encounter within last 6 months    Recent Outpatient Visits           1 month ago Physical exam, annual   [place] [facility] Jenelle Mis, FNP              Passed - Completed PHQ-2 or PHQ-9 in the last 360 days

## 2023-09-11 NOTE — Telephone Encounter (Signed)
 Prescription Request  09/11/2023  LOV: 08/01/23  What is the name of the medication or equipment? lidocaine  (LIDODERM ) 5 % [[id number]]      Have you contacted your pharmacy to request a refill? Yes   Which pharmacy would you like this sent to?  [facility] - [place], [place] - [address] [place] [address] Phone: [phone] Fax: [phone]    Patient notified that their request is being sent to the clinical staff for review and that they should receive a response within 2 business days.   Please advise at [facility] [phone]

## 2023-09-12 ENCOUNTER — Other Ambulatory Visit: Payer: Self-pay

## 2023-09-12 ENCOUNTER — Telehealth: Payer: Self-pay | Admitting: Pharmacy Technician

## 2023-09-12 ENCOUNTER — Other Ambulatory Visit (HOSPITAL_COMMUNITY): Payer: Self-pay

## 2023-09-12 MED ORDER — TRIAMCINOLONE ACETONIDE 0.1 % EX CREA: 1.0000 | TOPICAL_CREAM | Freq: Two times a day (BID) | CUTANEOUS | 0 refills | Status: DC

## 2023-09-12 NOTE — Telephone Encounter (Signed)
 Pharmacy Patient Advocate Encounter   Received notification from CoverMyMeds that prior authorization for Lidocaine  5% patches is required/requested.   Insurance verification completed.   The patient is insured through [facility] .   Per test claim: PA required; PA submitted to above mentioned insurance via CoverMyMeds Key/confirmation #/EOC [id number] Status is pending

## 2023-09-13 ENCOUNTER — Other Ambulatory Visit (HOSPITAL_COMMUNITY): Payer: Self-pay

## 2023-09-13 MED ORDER — PREGABALIN 75 MG PO CAPS: 75.0000 mg | ORAL_CAPSULE | Freq: Two times a day (BID) | ORAL | 0 refills | Status: AC

## 2023-09-13 NOTE — Telephone Encounter (Signed)
 Pharmacy Patient Advocate Encounter  Received notification from [employer] that Prior Authorization for Lidocaine  5% patches has been APPROVED from 09/12/23 to 04/16/24. Unable to obtain [place] due to refill too soon rejection, last fill date 09/12/23 next available fill date06/20/25   PA #/Case ID/Reference #: [id number]

## 2023-09-19 ENCOUNTER — Ambulatory Visit: Admitting: Family Medicine

## 2023-10-12 ENCOUNTER — Ambulatory Visit: Payer: Self-pay

## 2023-10-12 NOTE — Telephone Encounter (Signed)
  FYI Only or Action Required?: FYI only for provider.  Patient was last seen in primary care on 08/01/2023 by Kayla Jeoffrey RAMAN, FNP. Called Nurse Triage reporting No chief complaint on file.. Symptoms began several months ago. Interventions attempted: Nothing. Symptoms are: unchanged.  Triage Disposition: See PCP When Office is Open (Within 3 Days)  Patient/caregiver understands and will follow disposition?: Yes   This pt was all over the place with her call, she started by stating she has black dots on the bottom of her foot and then they feel tight, then she proceeds to state that she has a odor coming from her vagina. Then states that she has some lightheadedness and mild sob. Instructed pt to go to the ED for SOB worsening.  PT states that she will but wants an appt to discuss her vaginal odor and feet.               Copied from CRM [id number]. Topic: Clinical - Red Word Triage >> Oct 12, 2023  1:51 PM Carlatta H wrote: [facility] that prompted transfer to Nurse Triage: Both of the patient feet have black spots and bumps//Itchy and burning//   ----------------------------------------------------------------------- From previous Reason for Contact - Scheduling: Patient/patient representative is calling to schedule an appointment. Refer to attachments for appointment information. Reason for Disposition  [1] MODERATE dizziness (e.g., interferes with normal activities) AND [2] has been evaluated by doctor (or NP/PA) for this  Answer Assessment - Initial Assessment Questions 1. DESCRIPTION: Describe your dizziness.     dizzy 2. LIGHTHEADED: Do you feel lightheaded? (e.g., somewhat faint, woozy, weak upon standing)     Lightheaded when standing 3. VERTIGO: Do you feel like either you or the room is spinning or tilting? (i.e. vertigo)     yes 4. SEVERITY: How bad is it?  Do you feel like you are going to faint? Can you stand and walk?   - MILD: Feels slightly dizzy, but  walking normally.   - MODERATE: Feels unsteady when walking, but not falling; interferes with normal activities (e.g., school, work).   - SEVERE: Unable to walk without falling, or requires assistance to walk without falling; feels like passing out now.      Mild-mod 5. ONSET:  When did the dizziness begin?     3 weeks 6. AGGRAVATING FACTORS: Does anything make it worse? (e.g., standing, change in head position)     Changing positions 7. HEART RATE: Can you tell me your heart rate? How many beats in 15 seconds?  (Note: not all patients can do this)       no 8. CAUSE: What do you think is causing the dizziness?     Foot infection 9. RECURRENT SYMPTOM: Have you had dizziness before? If Yes, ask: When was the last time? What happened that time?      10. OTHER SYMPTOMS: Do you have any other symptoms? (e.g., fever, chest pain, vomiting, diarrhea, bleeding)       Vaginal odor, possible foot infection, mild sob.  Protocols used: Dizziness - Lightheadedness-A-AH

## 2023-10-15 ENCOUNTER — Ambulatory Visit: Admitting: Family Medicine

## 2023-10-16 ENCOUNTER — Ambulatory Visit: Admitting: Family Medicine

## 2023-10-22 ENCOUNTER — Ambulatory Visit: Admitting: Family Medicine

## 2023-10-25 ENCOUNTER — Ambulatory Visit: Admitting: Family Medicine

## 2023-11-01 ENCOUNTER — Ambulatory Visit: Admitting: Family Medicine

## 2023-11-12 ENCOUNTER — Ambulatory Visit: Admitting: Family Medicine

## 2023-11-22 ENCOUNTER — Ambulatory Visit: Admitting: Family Medicine

## 2023-11-26 ENCOUNTER — Ambulatory Visit: Admitting: Family Medicine

## 2023-11-28 ENCOUNTER — Ambulatory Visit: Payer: Self-pay

## 2023-11-28 NOTE — Telephone Encounter (Signed)
 Patient disconnected during transfer to triage, will attempt to call patient back .            Copied from CRM [id number]. Topic: Clinical - Red Word Triage >> Nov 28, 2023  2:29 PM Marissa P wrote: Red Word that prompted transfer to Nurse Triage: Patient is having pain in right foot, lower back, pain in both knees had surgery long time ago on left knee and her asthma meds went out. She needs her asthma machine

## 2023-12-07 ENCOUNTER — Other Ambulatory Visit: Payer: Self-pay | Admitting: Family Medicine

## 2023-12-07 DIAGNOSIS — M4316 Spondylolisthesis, lumbar region: Secondary | ICD-10-CM

## 2023-12-07 NOTE — Telephone Encounter (Signed)
 Copied from CRM [id number]. Topic: Clinical - Medication Refill >> Dec 07, 2023 12:13 PM Zebedee SAUNDERS wrote: Medication: albuterol  (VENTOLIN  HFA) 108 (90 Base) MCG/ACT inhaler ELIQUIS  5 MG TABS tablet FLUoxetine  (PROZAC ) 10 MG capsule hydrOXYzine  (ATARAX ) 25 MG tablet lidocaine  (LIDODERM ) 5 % pantoprazole  (PROTONIX ) 40 MG tablet pregabalin  (LYRICA ) 75 MG capsule triamcinolone  cream (KENALOG ) 0.1 %  Has the patient contacted their pharmacy? Yes (Agent: If no, request that the patient contact the pharmacy for the refill. If patient does not wish to contact the pharmacy document the reason why and proceed with request.) (Agent: If yes, when and what did the pharmacy advise?)Pharmacy need PCP approval.  This is the patient's preferred pharmacy:  [facility] - [place], [place] - [address] [place] [address] Phone: [phone] Fax: [phone]  Is this the correct pharmacy for this prescription? Yes If no, delete pharmacy and type the correct one.   Has the prescription been filled recently? Yes  Is the patient out of the medication? Yes  Has the patient been seen for an appointment in the last year OR does the patient have an upcoming appointment? Yes  Can we respond through MyChart? Yes  Agent: Please be advised that Rx refills may take up to 3 business days. We ask that you follow-up with your pharmacy.

## 2023-12-10 MED ORDER — HYDROXYZINE HCL 25 MG PO TABS: 25.0000 mg | ORAL_TABLET | Freq: Three times a day (TID) | ORAL | 0 refills | Status: DC | PRN

## 2023-12-10 MED ORDER — ALBUTEROL SULFATE HFA 108 (90 BASE) MCG/ACT IN AERS: 2.0000 | INHALATION_SPRAY | Freq: Four times a day (QID) | RESPIRATORY_TRACT | 0 refills | Status: DC | PRN

## 2023-12-10 MED ORDER — ELIQUIS 5 MG PO TABS: 5.0000 mg | ORAL_TABLET | Freq: Two times a day (BID) | ORAL | 0 refills | Status: DC

## 2023-12-10 NOTE — Telephone Encounter (Signed)
 Appt 12/18/23- will fill per protocol- some too soon Requested Prescriptions  Pending Prescriptions Disp Refills   albuterol  (VENTOLIN  HFA) 108 (90 Base) MCG/ACT inhaler 18 g 0    Sig: Inhale 2 puffs into the lungs every 6 (six) hours as needed.     Pulmonology:  Beta Agonists 2 Passed - 12/10/2023 10:55 AM      Passed - Last BP in normal range    BP Readings from Last 1 Encounters:  08/01/23 132/82         Passed - Last Heart Rate in normal range    Pulse Readings from Last 1 Encounters:  08/01/23 88         Passed - Valid encounter within last 12 months    Recent Outpatient Visits           4 months ago Physical exam, annual   [place] [facility] Kayla Jeoffrey RAMAN, FNP               ELIQUIS  5 MG TABS tablet 60 tablet 0    Sig: Take 1 tablet (5 mg total) by mouth 2 (two) times daily.     Hematology:  Anticoagulants - apixaban  Failed - 12/10/2023 10:55 AM      Failed - PLT in normal range and within 360 days    Platelets  Date Value Ref Range Status  05/29/2023 726 (H) 150 - 400 K/uL Final  04/26/2020 624 (H) 150 - 450 x10E3/uL Final   Platelet Count  Date Value Ref Range Status  07/15/2021 706 (H) 150 - 400 K/uL Final         Failed - HGB in normal range and within 360 days    Hemoglobin  Date Value Ref Range Status  05/29/2023 11.5 (L) 12.0 - 15.0 g/dL Final  96/68/7976 87.4 12.0 - 15.0 g/dL Final  98/89/7977 86.8 11.1 - 15.9 g/dL Final         Failed - HCT in normal range and within 360 days    HCT  Date Value Ref Range Status  05/29/2023 35.8 (L) 36.0 - 46.0 % Final   Hematocrit  Date Value Ref Range Status  04/26/2020 39.6 34.0 - 46.6 % Final         Passed - Cr in normal range and within 360 days    Creat  Date Value Ref Range Status  04/20/2017 0.74 0.50 - 1.10 mg/dL Final   Creatinine, Ser  Date Value Ref Range Status  05/29/2023 0.77 0.44 - 1.00 mg/dL Final         Passed - AST in normal range and within 360 days     AST  Date Value Ref Range Status  05/29/2023 23 15 - 41 U/L Final         Passed - ALT in normal range and within 360 days    ALT  Date Value Ref Range Status  05/29/2023 20 0 - 44 U/L Final         Passed - Valid encounter within last 12 months    Recent Outpatient Visits           4 months ago Physical exam, annual   [facility] Kayla Jeoffrey RAMAN, FNP               hydrOXYzine  (ATARAX ) 25 MG tablet 90 tablet 0    Sig: Take 1 tablet (25 mg total) by mouth 3 (three) times daily as needed.  Ear, Nose, and Throat:  Antihistamines 2 Passed - 12/10/2023 10:55 AM      Passed - Cr in normal range and within 360 days    Creat  Date Value Ref Range Status  04/20/2017 0.74 0.50 - 1.10 mg/dL Final   Creatinine, Ser  Date Value Ref Range Status  05/29/2023 0.77 0.44 - 1.00 mg/dL Final         Passed - Valid encounter within last 12 months    Recent Outpatient Visits           4 months ago Physical exam, annual   [place] [facility] Kayla Gauze S, FNP               lidocaine  (LIDODERM ) 5 % 30 patch 0    Sig: Place 1 patch onto the skin daily.     Analgesics:  Topicals Failed - 12/10/2023 10:55 AM      Failed - Manual Review: Labs are only required if the patient has taken medication for more than 8 weeks.      Failed - PLT in normal range and within 360 days    Platelets  Date Value Ref Range Status  05/29/2023 726 (H) 150 - 400 K/uL Final  04/26/2020 624 (H) 150 - 450 x10E3/uL Final   Platelet Count  Date Value Ref Range Status  07/15/2021 706 (H) 150 - 400 K/uL Final         Failed - HGB in normal range and within 360 days    Hemoglobin  Date Value Ref Range Status  05/29/2023 11.5 (L) 12.0 - 15.0 g/dL Final  96/68/7976 87.4 12.0 - 15.0 g/dL Final  98/89/7977 86.8 11.1 - 15.9 g/dL Final         Failed - HCT in normal range and within 360 days    HCT  Date Value Ref Range Status  05/29/2023 35.8  (L) 36.0 - 46.0 % Final   Hematocrit  Date Value Ref Range Status  04/26/2020 39.6 34.0 - 46.6 % Final         Passed - Cr in normal range and within 360 days    Creat  Date Value Ref Range Status  04/20/2017 0.74 0.50 - 1.10 mg/dL Final   Creatinine, Ser  Date Value Ref Range Status  05/29/2023 0.77 0.44 - 1.00 mg/dL Final         Passed - eGFR is 30 or above and within 360 days    GFR, Est African American  Date Value Ref Range Status  04/20/2017 111 > OR = 60 mL/min/1.51m2 Final   GFR calc Af Amer  Date Value Ref Range Status  12/26/2018 >60 >60 mL/min Final   GFR, Est Non African American  Date Value Ref Range Status  04/20/2017 96 > OR = 60 mL/min/1.46m2 Final   GFR, Estimated  Date Value Ref Range Status  05/29/2023 >60 >60 mL/min Final    Comment:    (NOTE) Calculated using the CKD-EPI Creatinine Equation (2021)          Passed - Patient is not pregnant      Passed - Valid encounter within last 12 months    Recent Outpatient Visits           4 months ago Physical exam, annual   [facility] Kayla Gauze RAMAN, FNP               pregabalin  (LYRICA ) 75 MG capsule 60 capsule  0    Sig: Take 1 capsule (75 mg total) by mouth 2 (two) times daily.     Not Delegated - Neurology:  Anticonvulsants - Controlled - pregabalin  Failed - 12/10/2023 10:55 AM      Failed - This refill cannot be delegated      Passed - Cr in normal range and within 360 days    Creat  Date Value Ref Range Status  04/20/2017 0.74 0.50 - 1.10 mg/dL Final   Creatinine, Ser  Date Value Ref Range Status  05/29/2023 0.77 0.44 - 1.00 mg/dL Final         Passed - Completed PHQ-2 or PHQ-9 in the last 360 days      Passed - Valid encounter within last 12 months    Recent Outpatient Visits           4 months ago Physical exam, annual   [facility] Kayla Gauze S, FNP               triamcinolone  cream (KENALOG ) 0.1 % 30  g 0    Sig: Apply 1 Application topically 2 (two) times daily.     Not Delegated - Dermatology:  Corticosteroids Failed - 12/10/2023 10:55 AM      Failed - This refill cannot be delegated      Passed - Valid encounter within last 12 months    Recent Outpatient Visits           4 months ago Physical exam, annual   [place] [facility] Kayla Gauze RAMAN, [place]              Refused Prescriptions Disp Refills   FLUoxetine  (PROZAC ) 10 MG capsule 90 capsule 3    Sig: Take 1 capsule (10 mg total) by mouth daily.     Psychiatry:  Antidepressants - SSRI Passed - 12/10/2023 10:55 AM      Passed - Completed PHQ-2 or PHQ-9 in the last 360 days      Passed - Valid encounter within last 6 months    Recent Outpatient Visits           4 months ago Physical exam, annual   [place] [facility] Kayla Gauze S, FNP               pantoprazole  (PROTONIX ) 40 MG tablet 90 tablet 1    Sig: Take 1 tablet (40 mg total) by mouth daily.     Gastroenterology: Proton Pump Inhibitors Passed - 12/10/2023 10:55 AM      Passed - Valid encounter within last 12 months    Recent Outpatient Visits           4 months ago Physical exam, annual   [place] [facility] Kayla Gauze RAMAN, FNP

## 2023-12-10 NOTE — Telephone Encounter (Signed)
 Requested medication (s) are due for refill today - provider review   Requested medication (s) are on the active medication list -yes  Future visit scheduled -yes, 12/18/23  Last refill: lidocaine - 09/11/23 #30- requires manual review                 Pregabalin -09/10/23 #60- non delegated Rx                 Triamcinolone - 09/12/23 30g- non delegated Rx  Notes to clinic: see above  Requested Prescriptions  Pending Prescriptions Disp Refills   lidocaine  (LIDODERM ) 5 % 30 patch 0    Sig: Place 1 patch onto the skin daily.     Analgesics:  Topicals Failed - 12/10/2023 10:56 AM      Failed - Manual Review: Labs are only required if the patient has taken medication for more than 8 weeks.      Failed - PLT in normal range and within 360 days    Platelets  Date Value Ref Range Status  05/29/2023 726 (H) 150 - 400 K/uL Final  04/26/2020 624 (H) 150 - 450 x10E3/uL Final   Platelet Count  Date Value Ref Range Status  07/15/2021 706 (H) 150 - 400 K/uL Final         Failed - HGB in normal range and within 360 days    Hemoglobin  Date Value Ref Range Status  05/29/2023 11.5 (L) 12.0 - 15.0 g/dL Final  96/68/7976 87.4 12.0 - 15.0 g/dL Final  98/89/7977 86.8 11.1 - 15.9 g/dL Final         Failed - HCT in normal range and within 360 days    HCT  Date Value Ref Range Status  05/29/2023 35.8 (L) 36.0 - 46.0 % Final   Hematocrit  Date Value Ref Range Status  04/26/2020 39.6 34.0 - 46.6 % Final         Passed - Cr in normal range and within 360 days    Creat  Date Value Ref Range Status  04/20/2017 0.74 0.50 - 1.10 mg/dL Final   Creatinine, Ser  Date Value Ref Range Status  05/29/2023 0.77 0.44 - 1.00 mg/dL Final         Passed - eGFR is 30 or above and within 360 days    GFR, Est African American  Date Value Ref Range Status  04/20/2017 111 > OR = 60 mL/min/1.57m2 Final   GFR calc Af Amer  Date Value Ref Range Status  12/26/2018 >60 >60 mL/min Final   GFR, Est Non African  American  Date Value Ref Range Status  04/20/2017 96 > OR = 60 mL/min/1.28m2 Final   GFR, Estimated  Date Value Ref Range Status  05/29/2023 >60 >60 mL/min Final    Comment:    (NOTE) Calculated using the CKD-EPI Creatinine Equation (2021)          Passed - Patient is not pregnant      Passed - Valid encounter within last 12 months    Recent Outpatient Visits           4 months ago Physical exam, annual   [facility] Medicine Kayla Jeoffrey RAMAN, FNP               pregabalin  (LYRICA ) 75 MG capsule 60 capsule 0    Sig: Take 1 capsule (75 mg total) by mouth 2 (two) times daily.     Not Delegated - Neurology:  Anticonvulsants - Controlled -  pregabalin  Failed - 12/10/2023 10:56 AM      Failed - This refill cannot be delegated      Passed - Cr in normal range and within 360 days    Creat  Date Value Ref Range Status  04/20/2017 0.74 0.50 - 1.10 mg/dL Final   Creatinine, Ser  Date Value Ref Range Status  05/29/2023 0.77 0.44 - 1.00 mg/dL Final         Passed - Completed PHQ-2 or PHQ-9 in the last 360 days      Passed - Valid encounter within last 12 months    Recent Outpatient Visits           4 months ago Physical exam, annual   [facility] Medicine Kayla Jeoffrey RAMAN, FNP               triamcinolone  cream (KENALOG ) 0.1 % 30 g 0    Sig: Apply 1 Application topically 2 (two) times daily.     Not Delegated - Dermatology:  Corticosteroids Failed - 12/10/2023 10:56 AM      Failed - This refill cannot be delegated      Passed - Valid encounter within last 12 months    Recent Outpatient Visits           4 months ago Physical exam, annual   [place] [facility] Medicine Kayla Jeoffrey RAMAN, [place]              Signed Prescriptions Disp Refills   albuterol  (VENTOLIN  HFA) 108 (90 Base) MCG/ACT inhaler 18 g 0    Sig: Inhale 2 puffs into the lungs every 6 (six) hours as needed.     Pulmonology:  Beta Agonists 2  Passed - 12/10/2023 10:56 AM      Passed - Last BP in normal range    BP Readings from Last 1 Encounters:  08/01/23 132/82         Passed - Last Heart Rate in normal range    Pulse Readings from Last 1 Encounters:  08/01/23 88         Passed - Valid encounter within last 12 months    Recent Outpatient Visits           4 months ago Physical exam, annual   [place] Medicine Kayla Jeoffrey RAMAN, FNP               ELIQUIS  5 MG TABS tablet 60 tablet 0    Sig: Take 1 tablet (5 mg total) by mouth 2 (two) times daily.     Hematology:  Anticoagulants - apixaban  Failed - 12/10/2023 10:56 AM      Failed - PLT in normal range and within 360 days    Platelets  Date Value Ref Range Status  05/29/2023 726 (H) 150 - 400 K/uL Final  04/26/2020 624 (H) 150 - 450 x10E3/uL Final   Platelet Count  Date Value Ref Range Status  07/15/2021 706 (H) 150 - 400 K/uL Final         Failed - HGB in normal range and within 360 days    Hemoglobin  Date Value Ref Range Status  05/29/2023 11.5 (L) 12.0 - 15.0 g/dL Final  96/68/7976 87.4 12.0 - 15.0 g/dL Final  98/89/7977 86.8 11.1 - 15.9 g/dL Final         Failed - HCT in normal range and within 360 days    HCT  Date Value Ref Range  Status  05/29/2023 35.8 (L) 36.0 - 46.0 % Final   Hematocrit  Date Value Ref Range Status  04/26/2020 39.6 34.0 - 46.6 % Final         Passed - Cr in normal range and within 360 days    Creat  Date Value Ref Range Status  04/20/2017 0.74 0.50 - 1.10 mg/dL Final   Creatinine, Ser  Date Value Ref Range Status  05/29/2023 0.77 0.44 - 1.00 mg/dL Final         Passed - AST in normal range and within 360 days    AST  Date Value Ref Range Status  05/29/2023 23 15 - 41 U/L Final         Passed - ALT in normal range and within 360 days    ALT  Date Value Ref Range Status  05/29/2023 20 0 - 44 U/L Final         Passed - Valid encounter within last 12 months    Recent Outpatient Visits            4 months ago Physical exam, annual   [facility] Medicine Kayla, Jeoffrey RAMAN, FNP               hydrOXYzine  (ATARAX ) 25 MG tablet 90 tablet 0    Sig: Take 1 tablet (25 mg total) by mouth 3 (three) times daily as needed.     Ear, Nose, and Throat:  Antihistamines 2 Passed - 12/10/2023 10:56 AM      Passed - Cr in normal range and within 360 days    Creat  Date Value Ref Range Status  04/20/2017 0.74 0.50 - 1.10 mg/dL Final   Creatinine, Ser  Date Value Ref Range Status  05/29/2023 0.77 0.44 - 1.00 mg/dL Final         Passed - Valid encounter within last 12 months    Recent Outpatient Visits           4 months ago Physical exam, annual   [facility] Medicine Kayla Jeoffrey RAMAN, FNP              Refused Prescriptions Disp Refills   FLUoxetine  (PROZAC ) 10 MG capsule 90 capsule 3    Sig: Take 1 capsule (10 mg total) by mouth daily.     Psychiatry:  Antidepressants - SSRI Passed - 12/10/2023 10:56 AM      Passed - Completed PHQ-2 or PHQ-9 in the last 360 days      Passed - Valid encounter within last 6 months    Recent Outpatient Visits           4 months ago Physical exam, annual   [facility] [facility] Medicine Kayla Jeoffrey RAMAN, FNP               pantoprazole  (PROTONIX ) 40 MG tablet 90 tablet 1    Sig: Take 1 tablet (40 mg total) by mouth daily.     Gastroenterology: Proton Pump Inhibitors Passed - 12/10/2023 10:56 AM      Passed - Valid encounter within last 12 months    Recent Outpatient Visits           4 months ago Physical exam, annual   [place] [facility] Kayla Jeoffrey RAMAN, FNP                 Requested Prescriptions  Pending Prescriptions Disp Refills   lidocaine  (LIDODERM )  5 % 30 patch 0    Sig: Place 1 patch onto the skin daily.     Analgesics:  Topicals Failed - 12/10/2023 10:56 AM      Failed - Manual Review: Labs are only required if the patient has  taken medication for more than 8 weeks.      Failed - PLT in normal range and within 360 days    Platelets  Date Value Ref Range Status  05/29/2023 726 (H) 150 - 400 K/uL Final  04/26/2020 624 (H) 150 - 450 x10E3/uL Final   Platelet Count  Date Value Ref Range Status  07/15/2021 706 (H) 150 - 400 K/uL Final         Failed - HGB in normal range and within 360 days    Hemoglobin  Date Value Ref Range Status  05/29/2023 11.5 (L) 12.0 - 15.0 g/dL Final  96/68/7976 87.4 12.0 - 15.0 g/dL Final  98/89/7977 86.8 11.1 - 15.9 g/dL Final         Failed - HCT in normal range and within 360 days    HCT  Date Value Ref Range Status  05/29/2023 35.8 (L) 36.0 - 46.0 % Final   Hematocrit  Date Value Ref Range Status  04/26/2020 39.6 34.0 - 46.6 % Final         Passed - Cr in normal range and within 360 days    Creat  Date Value Ref Range Status  04/20/2017 0.74 0.50 - 1.10 mg/dL Final   Creatinine, Ser  Date Value Ref Range Status  05/29/2023 0.77 0.44 - 1.00 mg/dL Final         Passed - eGFR is 30 or above and within 360 days    GFR, Est African American  Date Value Ref Range Status  04/20/2017 111 > OR = 60 mL/min/1.58m2 Final   GFR calc Af Amer  Date Value Ref Range Status  12/26/2018 >60 >60 mL/min Final   GFR, Est Non African American  Date Value Ref Range Status  04/20/2017 96 > OR = 60 mL/min/1.13m2 Final   GFR, Estimated  Date Value Ref Range Status  05/29/2023 >60 >60 mL/min Final    Comment:    (NOTE) Calculated using the CKD-EPI Creatinine Equation (2021)          Passed - Patient is not pregnant      Passed - Valid encounter within last 12 months    Recent Outpatient Visits           4 months ago Physical exam, annual   [place] [facility] Kayla Jeoffrey RAMAN, FNP               pregabalin  (LYRICA ) 75 MG capsule 60 capsule 0    Sig: Take 1 capsule (75 mg total) by mouth 2 (two) times daily.     Not Delegated - Neurology:   Anticonvulsants - Controlled - pregabalin  Failed - 12/10/2023 10:56 AM      Failed - This refill cannot be delegated      Passed - Cr in normal range and within 360 days    Creat  Date Value Ref Range Status  04/20/2017 0.74 0.50 - 1.10 mg/dL Final   Creatinine, Ser  Date Value Ref Range Status  05/29/2023 0.77 0.44 - 1.00 mg/dL Final         Passed - Completed PHQ-2 or PHQ-9 in the last 360 days      Passed - Valid encounter within  last 12 months    Recent Outpatient Visits           4 months ago Physical exam, annual   [place] Medicine Kayla Gauze S, FNP               triamcinolone  cream (KENALOG ) 0.1 % 30 g 0    Sig: Apply 1 Application topically 2 (two) times daily.     Not Delegated - Dermatology:  Corticosteroids Failed - 12/10/2023 10:56 AM      Failed - This refill cannot be delegated      Passed - Valid encounter within last 12 months    Recent Outpatient Visits           4 months ago Physical exam, annual   [place] [facility] Medicine Kayla Gauze RAMAN, [place]              Signed Prescriptions Disp Refills   albuterol  (VENTOLIN  HFA) 108 (90 Base) MCG/ACT inhaler 18 g 0    Sig: Inhale 2 puffs into the lungs every 6 (six) hours as needed.     Pulmonology:  Beta Agonists 2 Passed - 12/10/2023 10:56 AM      Passed - Last BP in normal range    BP Readings from Last 1 Encounters:  08/01/23 132/82         Passed - Last Heart Rate in normal range    Pulse Readings from Last 1 Encounters:  08/01/23 88         Passed - Valid encounter within last 12 months    Recent Outpatient Visits           4 months ago Physical exam, annual   [facility] Medicine Kayla Gauze S, FNP               ELIQUIS  5 MG TABS tablet 60 tablet 0    Sig: Take 1 tablet (5 mg total) by mouth 2 (two) times daily.     Hematology:  Anticoagulants - apixaban  Failed - 12/10/2023 10:56 AM      Failed - PLT in normal  range and within 360 days    Platelets  Date Value Ref Range Status  05/29/2023 726 (H) 150 - 400 K/uL Final  04/26/2020 624 (H) 150 - 450 x10E3/uL Final   Platelet Count  Date Value Ref Range Status  07/15/2021 706 (H) 150 - 400 K/uL Final         Failed - HGB in normal range and within 360 days    Hemoglobin  Date Value Ref Range Status  05/29/2023 11.5 (L) 12.0 - 15.0 g/dL Final  96/68/7976 87.4 12.0 - 15.0 g/dL Final  98/89/7977 86.8 11.1 - 15.9 g/dL Final         Failed - HCT in normal range and within 360 days    HCT  Date Value Ref Range Status  05/29/2023 35.8 (L) 36.0 - 46.0 % Final   Hematocrit  Date Value Ref Range Status  04/26/2020 39.6 34.0 - 46.6 % Final         Passed - Cr in normal range and within 360 days    Creat  Date Value Ref Range Status  04/20/2017 0.74 0.50 - 1.10 mg/dL Final   Creatinine, Ser  Date Value Ref Range Status  05/29/2023 0.77 0.44 - 1.00 mg/dL Final         Passed - AST in normal range and within 360  days    AST  Date Value Ref Range Status  05/29/2023 23 15 - 41 U/L Final         Passed - ALT in normal range and within 360 days    ALT  Date Value Ref Range Status  05/29/2023 20 0 - 44 U/L Final         Passed - Valid encounter within last 12 months    Recent Outpatient Visits           4 months ago Physical exam, annual   [place] [facility] Medicine Kayla Jeoffrey RAMAN, FNP               hydrOXYzine  (ATARAX ) 25 MG tablet 90 tablet 0    Sig: Take 1 tablet (25 mg total) by mouth 3 (three) times daily as needed.     Ear, Nose, and Throat:  Antihistamines 2 Passed - 12/10/2023 10:56 AM      Passed - Cr in normal range and within 360 days    Creat  Date Value Ref Range Status  04/20/2017 0.74 0.50 - 1.10 mg/dL Final   Creatinine, Ser  Date Value Ref Range Status  05/29/2023 0.77 0.44 - 1.00 mg/dL Final         Passed - Valid encounter within last 12 months    Recent Outpatient Visits            4 months ago Physical exam, annual   [facility] [facility] Medicine Kayla Jeoffrey RAMAN, FNP              Refused Prescriptions Disp Refills   FLUoxetine  (PROZAC ) 10 MG capsule 90 capsule 3    Sig: Take 1 capsule (10 mg total) by mouth daily.     Psychiatry:  Antidepressants - SSRI Passed - 12/10/2023 10:56 AM      Passed - Completed PHQ-2 or PHQ-9 in the last 360 days      Passed - Valid encounter within last 6 months    Recent Outpatient Visits           4 months ago Physical exam, annual   [place] [facility] Medicine Kayla Jeoffrey RAMAN, FNP               pantoprazole  (PROTONIX ) 40 MG tablet 90 tablet 1    Sig: Take 1 tablet (40 mg total) by mouth daily.     Gastroenterology: Proton Pump Inhibitors Passed - 12/10/2023 10:56 AM      Passed - Valid encounter within last 12 months    Recent Outpatient Visits           4 months ago Physical exam, annual   [facility] Kayla Jeoffrey RAMAN, FNP

## 2023-12-11 ENCOUNTER — Ambulatory Visit: Admitting: Family Medicine

## 2023-12-18 ENCOUNTER — Encounter: Payer: Self-pay | Admitting: Family Medicine

## 2023-12-18 ENCOUNTER — Ambulatory Visit: Admitting: Family Medicine

## 2024-01-18 ENCOUNTER — Telehealth: Payer: Self-pay

## 2024-01-18 NOTE — Telephone Encounter (Signed)
 [facility] faxed in request for Pharmacy orders at patient request . Patient has chosen to use [facility] as their primary pharmacy from now on and wanted all medications switched over to this pharmacy. [facility] faxed over confirmation forms for new prescription authorization as well as allergy list for their records. Forms have been completed and signed by provider Jeoffrey Barrio on 01/17/2024.  Forms were faxed to [facility] at:  Phone: [phone] Fax: [phone] On 01/18/2024 11:30 am  By CMA, SRP

## 2024-01-30 ENCOUNTER — Other Ambulatory Visit: Payer: Self-pay

## 2024-01-30 DIAGNOSIS — M4316 Spondylolisthesis, lumbar region: Secondary | ICD-10-CM

## 2024-01-30 MED ORDER — TRIAMCINOLONE ACETONIDE 0.1 % EX CREA: 1.0000 | TOPICAL_CREAM | Freq: Two times a day (BID) | CUTANEOUS | 0 refills | Status: AC

## 2024-01-30 MED ORDER — ALBUTEROL SULFATE HFA 108 (90 BASE) MCG/ACT IN AERS: 2.0000 | INHALATION_SPRAY | Freq: Four times a day (QID) | RESPIRATORY_TRACT | 0 refills | Status: AC | PRN

## 2024-01-30 MED ORDER — HYDROXYZINE HCL 25 MG PO TABS: 25.0000 mg | ORAL_TABLET | Freq: Three times a day (TID) | ORAL | 0 refills | Status: AC | PRN

## 2024-01-30 MED ORDER — FLUOXETINE HCL 10 MG PO CAPS: 10.0000 mg | ORAL_CAPSULE | Freq: Every day | ORAL | 3 refills | Status: AC

## 2024-01-30 MED ORDER — ELIQUIS 5 MG PO TABS: 5.0000 mg | ORAL_TABLET | Freq: Two times a day (BID) | ORAL | 0 refills | Status: AC

## 2024-01-30 MED ORDER — LIDOCAINE 5 % EX PTCH: 1.0000 | MEDICATED_PATCH | CUTANEOUS | 0 refills | Status: AC

## 2024-01-30 MED ORDER — PANTOPRAZOLE SODIUM 40 MG PO TBEC: 40.0000 mg | DELAYED_RELEASE_TABLET | Freq: Every day | ORAL | 1 refills | Status: AC

## 2024-01-30 NOTE — Telephone Encounter (Signed)
 Copied from CRM #[id number]. Topic: Clinical - Medication Question >> Jan 29, 2024  4:09 PM Shanda MATSU wrote: Reason for CRM: Norwood Hush w/[facility] CB: [phone] ext [phone], called in to adv that patient enrolled with them to fill her prescriptions on 01/14/2024, caller is req that all active medications now be sent to them. [facility] [place] - [place], [place] - [address] [address] Phone: [phone] Fax: [phone]

## 2024-02-01 ENCOUNTER — Other Ambulatory Visit: Payer: Self-pay | Admitting: Family Medicine

## 2024-02-01 ENCOUNTER — Telehealth: Payer: Self-pay

## 2024-02-01 NOTE — Telephone Encounter (Signed)
 Pt dismissed from practice.

## 2024-02-01 NOTE — Telephone Encounter (Signed)
 Copied from CRM [id number]. Topic: Clinical - Prescription Issue >> Feb 01, 2024  9:26 AM Robinson H wrote: Reason for CRM: [facility] with [facility] calling regarding refills for patient states they never received any of the refills on the medications, system shows refills were sent 10/15.  [employer] [phone] Ext [phone]

## 2024-02-01 NOTE — Telephone Encounter (Unsigned)
 Copied from CRM [id number]. Topic: Clinical - Medication Refill >> Feb 01, 2024  9:24 AM Robinson H wrote: Medication: pregabalin  (LYRICA ) 75 MG capsule  Has the patient contacted their pharmacy? Yes, pharmacy calling (Agent: If no, request that the patient contact the pharmacy for the refill. If patient does not wish to contact the pharmacy document the reason why and proceed with request.) (Agent: If yes, when and what did the pharmacy advise?)  This is the patient's preferred pharmacy:  [facility] - [place], [place] - [address] [place] [address] Phone: [phone] Fax: [phone]   Is this the correct pharmacy for this prescription? Yes If no, delete pharmacy and type the correct one.   Has the prescription been filled recently? No  Is the patient out of the medication? Yes  Has the patient been seen for an appointment in the last year OR does the patient have an upcoming appointment? Yes  Can we respond through MyChart? No  Agent: Please be advised that Rx refills may take up to 3 business days. We ask that you follow-up with your pharmacy.

## 2024-02-04 NOTE — Telephone Encounter (Signed)
 Requested medication (s) are due for refill today: yes  Requested medication (s) are on the active medication list: yes  Last refill:  09/13/23  Future visit scheduled: yes  Notes to clinic:  Unable to refill per protocol, cannot delegate.      Requested Prescriptions  Pending Prescriptions Disp Refills   pregabalin  (LYRICA ) 75 MG capsule 60 capsule 0    Sig: Take 1 capsule (75 mg total) by mouth 2 (two) times daily.     Not Delegated - Neurology:  Anticonvulsants - Controlled - pregabalin  Failed - 02/04/2024  1:53 PM      Failed - This refill cannot be delegated      Passed - Cr in normal range and within 360 days    Creat  Date Value Ref Range Status  04/20/2017 0.74 0.50 - 1.10 mg/dL Final   Creatinine, Ser  Date Value Ref Range Status  05/29/2023 0.77 0.44 - 1.00 mg/dL Final         Passed - Completed PHQ-2 or PHQ-9 in the last 360 days      Passed - Valid encounter within last 12 months    Recent Outpatient Visits           6 months ago Physical exam, annual   [place] [facility] Kayla Jeoffrey RAMAN, FNP

## 2024-02-06 ENCOUNTER — Telehealth: Payer: Self-pay | Admitting: Family Medicine

## 2024-02-06 NOTE — Telephone Encounter (Signed)
 Copied from CRM [id number]. Topic: Clinical - Prescription Issue >> Feb 06, 2024 11:25 AM Shanda MATSU wrote: Reason for CRM: [name] w/[facility] CB: [phone] calling in req that all meds prescribed by patient's PCP be sent to [facility] [place] - [place], [place] - [address] [address] Phone: [phone] Fax: [phone] when needing to be filled, caller is req that prescriptions be xferred to [facility] now so that way when it is time for a refill there will not be a lapse in the patient having their meds filled and sent to them.

## 2024-04-22 ENCOUNTER — Emergency Department (HOSPITAL_COMMUNITY)
Admission: EM | Admit: 2024-04-22 | Discharge: 2024-04-22 | Disposition: A | Discharge status: Home / Self Care | Attending: Emergency Medicine | Admitting: Emergency Medicine

## 2024-04-22 ENCOUNTER — Encounter (HOSPITAL_COMMUNITY): Payer: Self-pay | Admitting: Emergency Medicine

## 2024-04-22 ENCOUNTER — Other Ambulatory Visit: Payer: Self-pay

## 2024-04-22 DIAGNOSIS — G8929 Other chronic pain: Secondary | ICD-10-CM | POA: Diagnosis not present

## 2024-04-22 DIAGNOSIS — Z79899 Other long term (current) drug therapy: Secondary | ICD-10-CM | POA: Diagnosis not present

## 2024-04-22 DIAGNOSIS — M7989 Other specified soft tissue disorders: Secondary | ICD-10-CM | POA: Diagnosis not present

## 2024-04-22 DIAGNOSIS — M25461 Effusion, right knee: Secondary | ICD-10-CM

## 2024-04-22 DIAGNOSIS — I10 Essential (primary) hypertension: Secondary | ICD-10-CM | POA: Diagnosis not present

## 2024-04-22 DIAGNOSIS — M25561 Pain in right knee: Secondary | ICD-10-CM | POA: Diagnosis present

## 2024-04-22 DIAGNOSIS — J449 Chronic obstructive pulmonary disease, unspecified: Secondary | ICD-10-CM | POA: Diagnosis not present

## 2024-04-22 DIAGNOSIS — Z8673 Personal history of transient ischemic attack (TIA), and cerebral infarction without residual deficits: Secondary | ICD-10-CM | POA: Insufficient documentation

## 2024-04-22 DIAGNOSIS — Z7901 Long term (current) use of anticoagulants: Secondary | ICD-10-CM | POA: Diagnosis not present

## 2024-04-22 DIAGNOSIS — Z7951 Long term (current) use of inhaled steroids: Secondary | ICD-10-CM | POA: Insufficient documentation

## 2024-04-22 MED ORDER — HYDROMORPHONE HCL 1 MG/ML IJ SOLN
2.0000 mg | Freq: Once | INTRAMUSCULAR | Status: AC
  Administered 2024-04-22: 2 mg via INTRAMUSCULAR
  Filled 2024-04-22: qty 2

## 2024-04-22 MED ORDER — OXYCODONE HCL 10 MG PO TABS: 10.0000 mg | ORAL_TABLET | ORAL | 0 refills | Status: AC | PRN

## 2024-04-22 MED ORDER — OXYCODONE HCL 5 MG PO TABS
5.0000 mg | ORAL_TABLET | Freq: Once | ORAL | Status: AC
  Administered 2024-04-22: 5 mg via ORAL
  Filled 2024-04-22: qty 1

## 2024-04-22 MED ORDER — ONDANSETRON 8 MG PO TBDP
8.0000 mg | ORAL_TABLET | Freq: Once | ORAL | Status: DC
  Filled 2024-04-22: qty 1

## 2024-04-22 NOTE — Discharge Instructions (Signed)
 Apply ice for 30 minutes at a time, 4 times a day.  Please make an appointment with the orthopedic doctor for follow-up.

## 2024-04-22 NOTE — ED Provider Notes (Signed)
 " [place] EMERGENCY DEPARTMENT AT [facility] Provider Note   CSN: [id number] Arrival date & time: 04/22/24  0510     Patient presents with: Knee Pain   Kathryn [name] is a 54 y.o. female.   The history is provided by the patient.  Knee Pain  She has history of hypertension, pulmonary embolism, aortic thrombus anticoagulated on apixaban , COPD, osteoarthritis of the left knee, stroke and comes in complaining of pain and swelling of her right knee.  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  She states that she has been doing her usual activities but denies any specific trauma.  Pain has been getting worse over the last 1-2 days.  She does not have any medication for pain at home.  She is unable to take acetaminophen  or NSAIDs.    Prior to Admission medications  Medication Sig Start Date End Date Taking? Authorizing Provider  albuterol  (VENTOLIN  HFA) 108 (90 Base) MCG/ACT inhaler Inhale 2 puffs into the lungs every 6 (six) hours as needed. 01/30/24   Kayla Jeoffrey RAMAN, FNP  ELIQUIS  5 MG TABS tablet Take 1 tablet (5 mg total) by mouth 2 (two) times daily. 01/30/24   Kayla Jeoffrey RAMAN, FNP  FLUoxetine  (PROZAC ) 10 MG capsule Take 1 capsule (10 mg total) by mouth daily. 01/30/24   Kayla Jeoffrey RAMAN, FNP  hydrOXYzine  (ATARAX ) 25 MG tablet Take 1 tablet (25 mg total) by mouth 3 (three) times daily as needed. 01/30/24   Kayla Jeoffrey RAMAN, FNP  lidocaine  (LIDODERM ) 5 % Place 1 patch onto the skin daily. 01/30/24   Kayla Jeoffrey RAMAN, FNP  pantoprazole  (PROTONIX ) 40 MG tablet Take 1 tablet (40 mg total) by mouth daily. 01/30/24   Kayla Jeoffrey RAMAN, FNP  pregabalin  (LYRICA ) 75 MG capsule Take 1 capsule (75 mg total) by mouth 2 (two) times daily. 09/13/23   Kayla Jeoffrey RAMAN, FNP  triamcinolone  cream (KENALOG ) 0.1 % Apply 1 Application topically 2 (two) times daily. 01/30/24   Kayla Jeoffrey RAMAN, FNP    Allergies: Fish allergy,  Flexeril  [cyclobenzaprine  hcl], Ibuprofen, Motrin [ibuprofen], Shellfish allergy, Tylenol  [acetaminophen ], Tramadol , Ace inhibitors, Flexeril  [cyclobenzaprine ], Peanut allergen powder-dnfp, and Trazodone  and nefazodone    Review of Systems  All other systems reviewed and are negative.   Updated Vital Signs BP (!) 142/90   Pulse 96   Ht 4' 11 (1.499 m)   Wt 78 kg   LMP 06/07/2020 Comment: CT showed evidence of uterus and cervix  SpO2 100%   BMI 34.73 kg/m   Physical Exam Vitals and nursing note reviewed.   54 year old female, resting comfortably and in no acute distress. Vital signs are significant for borderline elevated blood pressure. Oxygen  saturation is 100%, which is normal. Head is normocephalic and atraumatic. PERRLA, EOMI.  Lungs are clear without rales, wheezes, or rhonchi. Heart has regular rate and rhythm without murmur. Abdomen is soft, flat, nontender. Extremities: Right knee has a moderate-sized effusion and is tender diffusely.  There is pain on any movement.  Although there is no instability.  There is no erythema or warmth. Skin is warm and dry without rash. Neurologic: Awake and alert.    Procedures   Medications Ordered in the ED  HYDROmorphone  (DILAUDID ) injection 2 mg (has no administration in time range)  ondansetron  (ZOFRAN -ODT) disintegrating tablet 8 mg (has no administration in time range)  oxyCODONE  (Oxy IR/ROXICODONE ) immediate release tablet 5 mg (  5 mg Oral Given 04/22/24 9386)  oxyCODONE  (Oxy IR/ROXICODONE ) immediate release tablet 5 mg (5 mg Oral Given 04/22/24 0650)                                    Medical Decision Making Risk Prescription drug management.   Right knee pain with effusion and patient with known history of osteoarthritis.  No indication of severe pathology such as septic joint.  At this point, I do not see any indications for imaging.  I have ordered a dose of oxycodone  for pain and will reassess.  No relief with initial  dose of oxycodone , I have ordered a repeat dose.    She had only slight relief of pain with second dose of oxycodone .  I have ordered a dose of hydromorphone  intramuscularly and I am discharging her with prescription for oxycodone , giving her a referral to orthopedics.     Final diagnoses:  Pain and swelling of right knee  Anticoagulated on apixaban     ED Discharge Orders          Ordered    oxyCODONE  10 MG TABS  Every 4 hours PRN        04/22/24 0741               Raford Lenis, MD 04/22/24 0745  "

## 2024-04-22 NOTE — ED Triage Notes (Signed)
 Pt c/o of chronic right knee pain and swelling that has gotten worse over the last day.

## 2024-05-08 ENCOUNTER — Telehealth: Payer: Self-pay

## 2024-05-08 DIAGNOSIS — Z5982 Transportation insecurity: Secondary | ICD-10-CM

## 2024-05-12 ENCOUNTER — Telehealth: Payer: Self-pay

## 2024-05-12 NOTE — Progress Notes (Unsigned)
 Complex Care Management Note Care Guide Note  05/12/2024 Name: Kathryn [name] MRN: [id number] DOB: 06-26-1970   Complex Care Management Outreach Attempts: An unsuccessful telephone outreach was attempted today to offer the patient information about available complex care management services.  Follow Up Plan:  Additional outreach attempts will be made to offer the patient complex care management information and services.   Encounter Outcome:  No Answer-No voicemail  Leotis Rase [facility], [facility] Guide  Direct Dial: [phone]  Fax [phone]

## 2024-05-13 NOTE — Progress Notes (Unsigned)
 Complex Care Management Note Care Guide Note  05/13/2024 Name: Kathryn [name] MRN: [id number] DOB: [birth year]   Complex Care Management Outreach Attempts: A second unsuccessful outreach was attempted today to offer the patient with information about available complex care management services.  Follow Up Plan:  Additional outreach attempts will be made to offer the patient complex care management information and services.   Encounter Outcome:  No Answer-No voicemail  Leotis Rase [facility], [facility] Guide  Direct Dial: [phone]  Fax [phone]

## 2024-05-14 NOTE — Progress Notes (Signed)
 Complex Care Management Note Care Guide Note  05/14/2024 Name: Kathryn [name] MRN: [id number] DOB: September 18, 1970   Complex Care Management Outreach Attempts: A third unsuccessful outreach was attempted today to offer the patient with information about available complex care management services.  Follow Up Plan:  No further outreach attempts will be made at this time. We have been unable to contact the patient to offer or enroll patient in complex care management services.  Encounter Outcome:  No Answer-No voicemail  Leotis Rase [facility], [facility] Guide  Direct Dial: [phone]  Fax [phone]

## 2024-05-16 ENCOUNTER — Other Ambulatory Visit: Payer: Self-pay | Admitting: Neurosurgery

## 2024-05-16 DIAGNOSIS — M4316 Spondylolisthesis, lumbar region: Secondary | ICD-10-CM

## 2024-05-20 ENCOUNTER — Inpatient Hospital Stay: Admission: RE | Admit: 2024-05-20 | Source: Ambulatory Visit

## 2024-06-10 ENCOUNTER — Other Ambulatory Visit
